# Patient Record
Sex: Female | Born: 1940 | Race: White | Hispanic: No | Smoking: Never smoker
Health system: Southern US, Community
[De-identification: ages and names within clinical notes are randomized; demographics above are authoritative.]

## PROBLEM LIST (undated history)

## (undated) DIAGNOSIS — B0229 Other postherpetic nervous system involvement: Secondary | ICD-10-CM

## (undated) DIAGNOSIS — J45909 Unspecified asthma, uncomplicated: Secondary | ICD-10-CM

## (undated) DIAGNOSIS — M1990 Unspecified osteoarthritis, unspecified site: Secondary | ICD-10-CM

## (undated) DIAGNOSIS — L900 Lichen sclerosus et atrophicus: Secondary | ICD-10-CM

## (undated) DIAGNOSIS — J849 Interstitial pulmonary disease, unspecified: Secondary | ICD-10-CM

## (undated) DIAGNOSIS — E611 Iron deficiency: Secondary | ICD-10-CM

## (undated) DIAGNOSIS — I10 Essential (primary) hypertension: Secondary | ICD-10-CM

## (undated) DIAGNOSIS — J189 Pneumonia, unspecified organism: Secondary | ICD-10-CM

## (undated) DIAGNOSIS — R799 Abnormal finding of blood chemistry, unspecified: Secondary | ICD-10-CM

## (undated) DIAGNOSIS — M069 Rheumatoid arthritis, unspecified: Secondary | ICD-10-CM

## (undated) DIAGNOSIS — I341 Nonrheumatic mitral (valve) prolapse: Secondary | ICD-10-CM

## (undated) DIAGNOSIS — Z8619 Personal history of other infectious and parasitic diseases: Secondary | ICD-10-CM

## (undated) DIAGNOSIS — B379 Candidiasis, unspecified: Secondary | ICD-10-CM

## (undated) DIAGNOSIS — N904 Leukoplakia of vulva: Secondary | ICD-10-CM

## (undated) HISTORY — DX: Rheumatoid arthritis, unspecified: M06.9

## (undated) HISTORY — DX: Candidiasis, unspecified: B37.9

## (undated) HISTORY — DX: Pneumonia, unspecified organism: J18.9

## (undated) HISTORY — DX: Abnormal finding of blood chemistry, unspecified: R79.9

## (undated) HISTORY — DX: Nonrheumatic mitral (valve) prolapse: I34.1

## (undated) HISTORY — DX: Personal history of other infectious and parasitic diseases: Z86.19

## (undated) HISTORY — DX: Leukoplakia of vulva: N90.4

## (undated) HISTORY — DX: Iron deficiency: E61.1

## (undated) HISTORY — DX: Essential (primary) hypertension: I10

## (undated) HISTORY — DX: Interstitial pulmonary disease, unspecified: J84.9

## (undated) HISTORY — PX: WISDOM TOOTH EXTRACTION: SHX21

## (undated) HISTORY — DX: Unspecified asthma, uncomplicated: J45.909

## (undated) HISTORY — DX: Other postherpetic nervous system involvement: B02.29

## (undated) HISTORY — DX: Unspecified osteoarthritis, unspecified site: M19.90

## (undated) HISTORY — DX: Lichen sclerosus et atrophicus: L90.0

---

## 1999-10-23 ENCOUNTER — Inpatient Hospital Stay (HOSPITAL_COMMUNITY): Admission: EM | Admit: 1999-10-23 | Discharge: 1999-10-24 | Payer: Self-pay

## 1999-11-05 ENCOUNTER — Encounter: Payer: Self-pay | Admitting: Critical Care Medicine

## 1999-11-05 ENCOUNTER — Ambulatory Visit: Admission: RE | Admit: 1999-11-05 | Discharge: 1999-11-05 | Payer: Self-pay | Admitting: Critical Care Medicine

## 1999-11-05 ENCOUNTER — Encounter (INDEPENDENT_AMBULATORY_CARE_PROVIDER_SITE_OTHER): Payer: Self-pay | Admitting: Specialist

## 2000-08-03 ENCOUNTER — Other Ambulatory Visit: Admission: RE | Admit: 2000-08-03 | Discharge: 2000-08-03 | Payer: Self-pay | Admitting: Obstetrics and Gynecology

## 2000-10-26 ENCOUNTER — Encounter: Payer: Self-pay | Admitting: Emergency Medicine

## 2000-10-26 ENCOUNTER — Emergency Department (HOSPITAL_COMMUNITY): Admission: EM | Admit: 2000-10-26 | Discharge: 2000-10-26 | Payer: Self-pay | Admitting: Emergency Medicine

## 2000-11-09 ENCOUNTER — Inpatient Hospital Stay (HOSPITAL_COMMUNITY): Admission: EM | Admit: 2000-11-09 | Discharge: 2000-11-12 | Payer: Self-pay | Admitting: Emergency Medicine

## 2000-11-17 ENCOUNTER — Encounter: Admission: RE | Admit: 2000-11-17 | Discharge: 2000-11-17 | Payer: Self-pay | Admitting: Internal Medicine

## 2000-11-17 ENCOUNTER — Encounter: Payer: Self-pay | Admitting: Internal Medicine

## 2000-12-30 ENCOUNTER — Encounter: Admission: RE | Admit: 2000-12-30 | Discharge: 2000-12-30 | Payer: Self-pay | Admitting: Internal Medicine

## 2000-12-30 ENCOUNTER — Encounter: Payer: Self-pay | Admitting: Internal Medicine

## 2002-10-04 ENCOUNTER — Other Ambulatory Visit: Admission: RE | Admit: 2002-10-04 | Discharge: 2002-10-04 | Payer: Self-pay | Admitting: *Deleted

## 2004-01-23 ENCOUNTER — Other Ambulatory Visit: Admission: RE | Admit: 2004-01-23 | Discharge: 2004-01-23 | Payer: Self-pay | Admitting: *Deleted

## 2005-02-11 ENCOUNTER — Ambulatory Visit (HOSPITAL_COMMUNITY): Admission: RE | Admit: 2005-02-11 | Discharge: 2005-02-11 | Payer: Self-pay | Admitting: *Deleted

## 2006-05-12 ENCOUNTER — Other Ambulatory Visit: Admission: RE | Admit: 2006-05-12 | Discharge: 2006-05-12 | Payer: Self-pay | Admitting: Obstetrics and Gynecology

## 2006-05-12 DIAGNOSIS — N904 Leukoplakia of vulva: Secondary | ICD-10-CM

## 2006-05-12 HISTORY — DX: Leukoplakia of vulva: N90.4

## 2006-05-26 DIAGNOSIS — L900 Lichen sclerosus et atrophicus: Secondary | ICD-10-CM

## 2006-05-26 HISTORY — DX: Lichen sclerosus et atrophicus: L90.0

## 2007-01-04 ENCOUNTER — Encounter: Admission: RE | Admit: 2007-01-04 | Discharge: 2007-01-04 | Payer: Self-pay | Admitting: Internal Medicine

## 2007-04-12 ENCOUNTER — Ambulatory Visit (HOSPITAL_COMMUNITY): Admission: RE | Admit: 2007-04-12 | Discharge: 2007-04-12 | Payer: Self-pay | Admitting: *Deleted

## 2007-05-10 ENCOUNTER — Encounter (INDEPENDENT_AMBULATORY_CARE_PROVIDER_SITE_OTHER): Payer: Self-pay | Admitting: Surgery

## 2007-05-11 ENCOUNTER — Inpatient Hospital Stay (HOSPITAL_COMMUNITY): Admission: RE | Admit: 2007-05-11 | Discharge: 2007-05-12 | Payer: Self-pay | Admitting: Surgery

## 2009-06-08 ENCOUNTER — Encounter: Admission: RE | Admit: 2009-06-08 | Discharge: 2009-06-08 | Payer: Self-pay | Admitting: Internal Medicine

## 2009-07-13 HISTORY — PX: CHOLECYSTECTOMY: SHX55

## 2009-12-17 ENCOUNTER — Emergency Department (HOSPITAL_COMMUNITY): Admission: EM | Admit: 2009-12-17 | Discharge: 2009-12-18 | Payer: Self-pay | Admitting: Emergency Medicine

## 2010-08-03 ENCOUNTER — Encounter: Payer: Self-pay | Admitting: Internal Medicine

## 2010-09-29 LAB — POCT I-STAT, CHEM 8
BUN: 9 mg/dL (ref 6–23)
Calcium, Ion: 1.13 mmol/L (ref 1.12–1.32)
Chloride: 103 mEq/L (ref 96–112)
Creatinine, Ser: 0.8 mg/dL (ref 0.4–1.2)
Glucose, Bld: 85 mg/dL (ref 70–99)
HCT: 38 % (ref 36.0–46.0)
Hemoglobin: 12.9 g/dL (ref 12.0–15.0)
Potassium: 4.2 mEq/L (ref 3.5–5.1)
Sodium: 140 mEq/L (ref 135–145)
TCO2: 31 mmol/L (ref 0–100)

## 2010-09-29 LAB — CBC
HCT: 36.6 % (ref 36.0–46.0)
Hemoglobin: 12.3 g/dL (ref 12.0–15.0)
MCHC: 33.5 g/dL (ref 30.0–36.0)
MCV: 92.2 fL (ref 78.0–100.0)
Platelets: 214 10*3/uL (ref 150–400)
RBC: 3.97 MIL/uL (ref 3.87–5.11)
RDW: 14.8 % (ref 11.5–15.5)
WBC: 7.1 10*3/uL (ref 4.0–10.5)

## 2010-09-29 LAB — DIFFERENTIAL
Basophils Absolute: 0.1 10*3/uL (ref 0.0–0.1)
Basophils Relative: 2 % — ABNORMAL HIGH (ref 0–1)
Eosinophils Absolute: 0.3 10*3/uL (ref 0.0–0.7)
Eosinophils Relative: 4 % (ref 0–5)
Lymphocytes Relative: 45 % (ref 12–46)
Lymphs Abs: 3.2 10*3/uL (ref 0.7–4.0)
Monocytes Absolute: 0.8 10*3/uL (ref 0.1–1.0)
Monocytes Relative: 12 % (ref 3–12)
Neutro Abs: 2.6 10*3/uL (ref 1.7–7.7)
Neutrophils Relative %: 37 % — ABNORMAL LOW (ref 43–77)

## 2010-09-29 LAB — URINALYSIS, ROUTINE W REFLEX MICROSCOPIC
Bilirubin Urine: NEGATIVE
Glucose, UA: NEGATIVE mg/dL
Hgb urine dipstick: NEGATIVE
Ketones, ur: NEGATIVE mg/dL
Nitrite: NEGATIVE
Protein, ur: NEGATIVE mg/dL
Specific Gravity, Urine: 1.006 (ref 1.005–1.030)
Urobilinogen, UA: 0.2 mg/dL (ref 0.0–1.0)
pH: 7 (ref 5.0–8.0)

## 2010-09-29 LAB — URINE MICROSCOPIC-ADD ON

## 2010-11-25 NOTE — Op Note (Signed)
Stacey [name], Stacey [name]             ACCOUNT NO.:  [id number]   MEDICAL RECORD NO.:  [id number]          PATIENT TYPE:  INP   LOCATION:  1601                         FACILITY:  [facility]   PHYSICIAN:  Georgiana Spinner, M.D.    DATE OF BIRTH:  01-16-1941   DATE OF PROCEDURE:  05/12/2007  DATE OF DISCHARGE:  05/12/2007                               OPERATIVE REPORT   PROCEDURE:  Endoscopic retrograde cholangiopancreatography.   PROCEDURE INDICATIONS:  Common bile duct stones.   ANESTHESIA:  Fentanyl 50 mcg, Versed 6 mg.   PROCEDURE:  With the patient mildly sedated in the prone position, the  Pentax videoscopic side-viewing duodenoscope was inserted in the mouth,  passed through the esophagus, stomach into the second portion of the  duodenum where the ampulla Vater was seen.  Bile was seen to emanate  from the opening.  After shortening the endoscope into the proper  position, we then used the Tritome catheter and placed it in the os.  A  guidewire was passed and seen to enter into the common bile duct.  I  followed this guidewire with a catheter.  Injected contrast material to  outline the common bile duct.  It was noted that there were four filling  defects consistent with common bile duct stones seen.  Once this was  noted, the catheter was pulled back and the patient was grounded and a  sphincterotomy was performed.  Once I was satisfied with the length of  the sphincterotomy the Tritome catheter was removed and a 12.5 mm  balloon catheter was passed over the guidewire and passed proximally  into the common bile duct where was inflated and pulled through.  After  the first pull through, I then put the balloon catheter back in place  distally at the site of the incision and injected contrast material once  again into the common bile duct.  At that point no further filling  defects were seen and a radiograph was taken.  The balloon was deflated  and passed proximally to the area of the  bifurcation and once again it  was insufflated and pulled through.  With the second pull through the  balloon was again deflated and placed distally at the area of the  sphincterotomy.  Contrast was once again injected into the common bile  duct and no filling defects were once again seen.  The balloon was then  deflated, the endoscope was withdrawn.  The patient's vital signs, pulse  oximeter remained stable.  The patient tolerated procedure well without  apparent complications.   FINDINGS:  Common bile duct stones, sphincterotomy performed and balloon  pull through with no residual stones seen on radiograph.   PLAN:  Have patient follow clinically           ______________________________  Georgiana Spinner, M.D.     GMO/MEDQ  D:  05/12/2007  T:  05/12/2007  Job:  [id number]   cc:   Velora Heckler, MD  [address] [place]  [place] [address]

## 2010-11-25 NOTE — Op Note (Signed)
Stacey [name], Stacey [name]             ACCOUNT NO.:  [id number]   MEDICAL RECORD NO.:  [id number]          PATIENT TYPE:  OIB   LOCATION:  0098                         FACILITY:  [facility]   PHYSICIAN:  Velora Heckler, MD      DATE OF BIRTH:  July 15, 1940   DATE OF PROCEDURE:  05/10/2007  DATE OF DISCHARGE:                               OPERATIVE REPORT   PREOPERATIVE DIAGNOSES:  1. Symptomatic cholelithiasis.  2. Chronic cholecystitis.   POSTOPERATIVE DIAGNOSES:  1. Symptomatic cholelithiasis.  2. Chronic cholecystitis.  3. Choledocholithiasis.   PROCEDURE:  Laparoscopic cholecystectomy with intraoperative  cholangiography.   SURGEON:  Velora Heckler MD, FACS   ANESTHESIA:  General per Dr. Cristela Blue.   ESTIMATED BLOOD LOSS:  Minimal.   PREPARATION:  Betadine.   COMPLICATIONS:  None.   INDICATIONS:  The patient is a 70 year old white female from [place],  [place].  The patient has had intermittent epigastric abdominal  pain for several months.  She was seen and evaluated by Dr. Sabino Gasser  and initially had elevated liver function tests which normalized.  The  patient has noted some intermittent acholic stools.  Ultrasound  demonstrated multiple gallstones.  The patient now comes to surgery for  cholecystectomy and intraoperative cholangiography.   DESCRIPTION OF PROCEDURE:  Procedure was done in OR #1 at [facility].  The patient is brought to the operating room,  placed in supine position on the operating room table.  Following  administration of general anesthesia, the patient is positioned and then  prepped and draped in the usual strict aseptic fashion.  After  ascertaining that an adequate level of anesthesia had been obtained, a  infraumbilical incision is made in the midline with a #15 blade.  Dissection is carried down to the fascia.  There is a small umbilical  hernia.  The hernia defect is opened sharply with the #15 blade.  It  contains preperitoneal fat which is reduced back within the peritoneal  cavity.  The hernia defect is enlarged slightly by incising the fascia.  Peritoneal cavity is then entered cautiously.  A #1 Ethibond pursestring  suture is placed around the hernia defect.  A port is introduced and  secured with the pursestring suture.  Abdomen is insufflated with carbon  dioxide.  Laparoscope is introduced and the abdomen explored.  Operative  ports are placed along the right costal margin in the midline,  midclavicular line and anterior axillary line.  Fundus of the  gallbladder is grasped and retracted cephalad.  There are numerous  omental adhesions to the undersurface of the gallbladder which are taken  down with gentle blunt dissection and hemostasis obtained with the  electrocautery.  Dissection is carried down to the neck of the  gallbladder.  Gallbladder wall appears moderately thickened.  Peritoneum  is incised at the neck of the gallbladder and the cystic duct is  dissected out along its length.  Cystic artery is also dissected out,  doubly clipped, and divided.  The triangle of Calot is widely opened.  Clip is placed at the neck of the  gallbladder.  Cystic duct is incised.  Clear yellow bile emanates from the cystic duct.  A Cook cholangiography  catheter is introduced through a stab wound in the right upper quadrant.  It is advanced and inserted into the cystic duct and secured with a  Ligaclip.  Using C-arm fluoroscopy, real time cholangiography is  performed.  There is rapid filling of a normal-appearing cystic duct.  There is free flow into the common bile duct.  There is reflux of  contrast into the right and left hepatic ductal systems.  There is flow  distally into the distal common bile duct at which point there are  multiple filling defects representing at least four choleliths.  These  are partially obstructing although some contrast does reach the  duodenum.  Clip is withdrawn  and Cook catheter is removed from the  peritoneal cavity.  Cystic duct is triply clipped and divided.  Gallbladder is then excised from the gallbladder bed using the hook  electrocautery for hemostasis.  Gallbladder is placed into an EndoCatch  bag and withdrawn through the umbilical port.  It contains multiple  gallstones.  A #1 at the bond pursestring suture is then tied securely.  Right upper quadrant is irrigated with warm saline which is evacuated.  Ports are removed under direct vision and good hemostasis is noted at  all port sites.  Pneumoperitoneum is released.  All ports are removed.  Port sites are anesthetized with local anesthetic.  Wounds are closed  with interrupted 4-0 Vicryl subcuticular sutures.  Wounds are washed and  dried.  Benzoin and Steri-Strips are applied.  Sterile dressings are  applied.  The patient is awakened from anesthesia and brought to the  recovery room in stable condition.  The patient tolerated the procedure  well.      Velora Heckler, MD  Electronically Signed     TMG/MEDQ  D:  05/10/2007  T:  05/10/2007  Job:  [id number]   cc:   Georgiana Spinner, M.D.  Fax: [phone]   Soyla Murphy. Renne Crigler, M.D.  Fax: [phone]

## 2010-11-28 NOTE — Discharge Summary (Signed)
[place]. [facility]  Patient:    Stacey [name], Stacey [name]                    MRN: [id number] Adm. Date:  [id number] Disc. Date: [id number] Attending:  Londell Moh CC:         Jaclyn Prime. Lucas Mallow, M.D.   Discharge Summary  LABORATORIES:  Viral titers - pending.  Blood cultures x 4, negative.  CPKs normal.  IgG slightly low, 459 - total, IgM total within normal limits 3. Hepatic panel was normal except for low albumin 2.1, low total protein 5.1. Glucose levels range from 124 to 130, initial setting was 133, by the second it was 137.  INR and PTT were normal.  SED rate was 130, white count 14.4, hemoglobin 9.2 to 10.0.  Platelet count 513,000.  LABORATORY REPORTS:  Chest x-ray, no active disease.  EKG, normal sinus rhythm, occasional premature, supraventricular complexes, otherwise normal. SGT abnormality consistent with early repolarization.  Echocardiogram, trivial aortic valvular regurgitation, otherwise normal with no pericardial effusion.  HOSPITAL COURSE:  Please see admission history and physical for details. Briefly, she was admitted with sharp left anterior chest pain and EKG consistent with acute pericarditis.  Echocardiogram did not show any significant pericarditis, however.  She was treated with IV Solu-Medrol according to directions of Dr. Jimmy Footman and was seen by Dr. Lucas Mallow in consultation as well.  Cliffton Asters of infectious disease also helped with her care.  Suggest we observe her off antibiotics.  By May 2, she was switched to p.o. Medrol and her symptoms gradually improved.  By May 3, her pain was gone, she was discharged.  DISCHARGE MEDICATIONS:  1. Ambien 10 mg 1/2 at bedtime if needed.  2. Medrol 16 mg 2 in the morning and 1 in the evening.  3. Neurontin 100 mg 3 times a day.  4. Effexor XR 75 mg daily.  5. Ultracet 1 p.o. 4 times a day as needed for pain.  6. B-complex daily.  7. Prilosec 20 mg daily.  8. Dyazide as needed for  edema ______ .  9. Three Fibercon tablets nightly. 10. Citracal twice a day. 11. Multivitamin daily.  FOLLOWUP:  She is to follow up with Dr. Jimmy Footman the following Wednesday. She will not take Kinered or Arava until she sees Dr. Jimmy Footman. DD:  11/19/00 TD:  11/22/00 Job: [id number]

## 2010-11-28 NOTE — H&P (Signed)
[place]. [facility]  Patient:    Stacey [name], Stacey [name]                      MRN: [id number] Adm. Date:  11/09/00 Attending:  Lemmie Evens, M.D.                         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  In the early evening, last night, the patient developed severe retrosternal chest pain. The pain did seem to be affected by deep breathing at times. She describes the pain as a fullness in the center of the chest. It is non migrating and is not associated with cough. She has had mild regurgitation and did take an antacid without significant relief of the symptoms. She did come to the office today because of the continued heaviness in the chest. The patient has not had a recent history of fevers, sweats, chills, a productive cough, abdominal discomfort, weight loss.  In the office, it is was noted the patient did have an abnormal EKG with ST segment elevations in most of the leads. This could be consistent with either ischemic heart disease or pericarditis.  The patient does have a history of rheumatoid arthritis for the past 2 to 3 years. 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 Delford Field, M.D. The patient did recently discontinue the antibiotics for the M. avium which included Myambutol and Biaxin.  The patient also has developed herpes zoster infection involving the left paralumbar area. This did extend down the left leg. She has been followed in the pain clinic at [facility] for treatment of post herpetic neuralgia. She has been taking Neurontin 300 mg 3 to 4 times a day for treatment of  that.  The patient has been on various disease-modifying agents. The patient did take Remicade and methotrexate, as well as Medrol at the time when she developed M. avium. The patient has not taken methotrexate or Remicade since that time. She was medicated with Plaquenil which seemed to cause a skin rash. She did develop signs of herpes zoster while taking Enbrel injections.  The patient did receive her first Kineret injection subcutaneous yesterday. Apparently, she had no immediate side effects from that medication after the injection.  CURRENT MEDICATIONS: 1. The patient does take Arava 20 mg daily. 2. Medrol 8 mg daily. 3. Dyazide one tablet every other day for control of peripheral edema. 4. MiraLax as needed. 5. Neurontin 300 mg 3 times a day. 6. Effexor 37.5 mg daily. 7. Also, she will use Ativan at night and Nexium during the day.  ALLERGIES:  The patient does give an allergic history to both SULFA and PENICILLIN medications.  SOCIAL HISTORY:  The patient does not smoke cigarettes or drink alcohol.  PHYSICAL EXAMINATION:  GENERAL:  The patient appears to be acutely ill complaining of retrosternal chest pain.  EXTREMITIES:  Examination of the extremities revealed decreased hand grip bilaterally. There is some swelling of the second to the fourth MCP and PIP joints. Wrist, elbow, and shoulder exam revealed good range of motion.  The knee exam revealed good flexion, extension of the knee. Ankle and foot exam revealed some periarticular swelling of both ankle joints with pain on motion of the ankle joint. There is 2 to 3+ pitting edema pretibially. Hip exam revealed good hip range of motion.  LUNGS:  Clear breath sounds bilaterally without rales.  HEART:  Regular sinus rhythm. Heart rate of 100. No definite murmur, gallop, or rub was appreciated.  ABDOMEN:  Soft abdomen without hepatosplenomegaly.  NECK:  No definite thyromegaly or adenopathy.  ASSESSMENT:  The  patient is being admitted for evaluation of persistent chest pain over the last 12 hour. EKG does show ST elevation consistent with either an acute myocardial event or pericarditis. She is to be admitted to a monitor unit and will be seen by a cardiologist. I do plan to maintain the steroid medication and even give her parenteral steroids for treatment of possible pericarditis. The patient will be considered for continued use of the Kineret and Arava after her discharge.DD:  11/09/00 TD:  11/09/00 Job: [id number]

## 2010-11-28 NOTE — Op Note (Signed)
Stacey [name], Stacey [name]             ACCOUNT NO.:  [id number]   MEDICAL RECORD NO.:  [id number]          PATIENT TYPE:  AMB   LOCATION:  ENDO                         FACILITY:  [facility]   PHYSICIAN:  Georgiana Spinner, M.D.    DATE OF BIRTH:  [birth year]   DATE OF PROCEDURE:  02/11/2005  DATE OF DISCHARGE:                                 OPERATIVE REPORT   PROCEDURE:  Upper endoscopy.   INDICATIONS:  Gastroesophageal reflux disease.   ANESTHESIA:  Demerol 20, Versed 2 mg.   DESCRIPTION OF PROCEDURE:  With the patient mildly sedated in the left  lateral decubitus position, the Olympus videoscopic endoscope was inserted  in the mouth, passed under direct vision through the esophagus which  appeared normal into the stomach. The fundus, body, antrum, duodenal bulb,  and second portion of duodenum were visualized. From this point, the  endoscope was slowly withdrawn taking circumferential views of the duodenal  mucosa until the endoscope had been pulled back into the stomach, placed in  retroflexion to view the stomach from below. The endoscope was straightened  and  withdrawn taking circumferential views of the remaining gastric and  esophageal mucosa. The patient's vital signs and pulse oximeter remained  stable. The patient tolerated the procedure well without apparent  complications.   FINDINGS:  Rather unremarkable endoscopic examination.   PLAN:  Proceed to colonoscopy.       GMO/MEDQ  D:  02/11/2005  T:  02/11/2005  Job:  [id number]

## 2010-11-28 NOTE — Consult Note (Signed)
[place]. [facility]  Patient:    KRISTALYN, BERGSTRESSER                    MRN: [id number] Proc. Date: 11/10/00 Adm. Date:  [id number] Attending:  Londell Moh CC:         Soyla Murphy. Renne Crigler, M.D.  Lemmie Evens, M.D.   Consultation Report  REASON FOR CONSULTATION: It is a privilege and pleasant to participate in the care of Ms. Lalania Haseman, a 70 year old patient of Dr. Syliva Overman and Dr. Merri Brunette, at Dr. Shellia Cleverly request, by providing consultative services in regard to Ms. Pierces chest pain.  HISTORY OF PRESENT ILLNESS: She reports that about 3 a.m. on arising and going back to bed she developed severe retrosternal chest pain, which did seem to be effected by breathing to some degree.  There was no associated shortness of breath.  She took an antacid and has no significant relief from the symptoms. On awakening in the morning she continued to have chest pain and came to the office to be evaluated.  An EKG was taken which showed ST elevations in multiple leads, and she was admitted because of the concern that the chest pain could represent acute myocardial ischemia.  PAST MEDICAL HISTORY: Her past medical history has been quite difficult.  She was in very good health until about three years ago when she developed very aggressive rheumatoid arthritis, and has had multiple complications from almost every medication that has been used.  This included M. avium pneumonitis, which was treated also in conjunction with Dr. Shan Levans. She developed herpes zoster relatively recently.  This has effected her left leg, and she has been followed in the pain clinic at [facility] for treatment of postherpetic neuralgia.  She has been taking Neurontin 300 mg three or four times daily.  She has had no other major medical illness, except as associated with rheumatoid arthritis and numerous problems with medications, which are also detailed in  Dr. Landry Dyke History and Physical.  She has been on numerous disease-modifying agents and, unfortunately, has had adverse reactions to most of them.  PAST SURGICAL HISTORY: She has never had a major surgical operation.  SOCIAL HISTORY: She lives at home with her husband of many years.  She has never smoked and uses no alcohol.  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  Her mother, who is 4, lives in town and has been very helpful during times of the patients worse immobility.  She has a daughter who is grown up and married. There is also a son who is grown up and married.  FAMILY HISTORY: Her father died in his mid 33s of smoking-related lung disease.  Her mother, now 4, has had mild hypertension, and has been treated for several years for polymyalgia rheumatica.  REVIEW OF SYSTEMS: CONSTITUTIONAL: She has had no fever or chill.  She has no claudication.  Her sleep is often disturbed by pain.  EYES: No diplopia or blurring.  She does wear corrective lenses. ENT: No deafness of dizziness. She has her own teeth.  CARDIOVASCULAR: See History of Present Illness.  There is no prior history of any cardiac problem, including palpitations or dyspnea. RESPIRATORY: No cough or wheezing.  Mycobacterium avium infection as noted above.  This apparently has now been  cured following a long course of antibiotics.  GI: Occasional heartburn and occasional constipation.  GU: No dysuria or pyuria.  MUSCULOSKELETAL: Severe diffuse rheumatoid arthritis as noted above.  SKIN/BREAST: No rash or nodule except the zoster rash. NEUROLOGIC: She did have an episode of apparent fainting about two weeks ago. She has had a Holter monitor since.  She has had one or two episodes of possible postural hypotension.  These will require further evaluation at some point.   PSYCHIATRIC: No depression or hallucinations.  ENDOCRINE: No known diabetes or thyroid disease.  HEMATOLOGIC/LYMPH: No swelling in neck, axillae or groin.  All the remaining systems in the comprehensive 14 system review are negative.  ALLERGIES: She has specific allergies to:  1. PENICILLIN.  2. SULFA.  3. Multiple adverse drug reactions, for which reference is made to Dr.     Landry Dyke History and Physical.  CURRENT MEDICATIONS:  1. Arava 20 mg q.d.  2. Medrol 8 mg q.d.  3. Dyazide one tablet every other day for control of intermittent edema.  4. MiraLax as-needed.  5. Neurontin 300 mg t.i.d.  6. Effexor 37.5 mg.  7. Ativan at night.  8. Nexium during the day.  9. On the day prior to this admission she was started on a new medication,     Kineret, an interleukin-1 inhibitor.  PHYSICAL EXAMINATION:  VITAL SIGNS: Blood pressure 130/80, pulse 90 and regular, respirations 22 and unlabored.  GENERAL: She is a well-developed, well-nourished, but now somewhat cushingoid appearing woman, who looks approximately her stated age of 3.  She is oriented to person, place, and time.  Mood and affect are appropriate.  HEENT: Conjunctivae and lids reveal no xanthelasma, icterus, or arcus senilis. Her teeth are her own and in good repair.  Oral mucosa reveals no pallor or cyanosis.  NECK: Supple and symmetric.  Trachea midline and mobile.  No palpable thyromegaly or cervical node, no carotid bruits or JVD.  CHEST: Her respiratory effort is normal.  Her lungs are clear to auscultation and percussion.  BACK: Straight without kyphosis, scoliosis, or punch tenderness.  Her gait is slow and stiff.  She could not undergo a stress test.  CARDIOVASCULAR: The cardiac apical impulse is normal in position, size, and duration. Heart rhythm is regular and rate is normal.  There is no gallop or click.  There may be a very soft rub at the left left sternal border.  If so,  it is grade 1/6 at most.   There is no diastolic rub.  There is possibly a scratch around the second heart sound at the left upper sternal border.  The digits and nail reveal no clubbing or cyanosis.  Skin and subcutaneous tissue reveal no stasis dermatitis or ulcer.  ABDOMEN: Flat, nontender, without hepatosplenomegaly.  The abdominal aorta is not palpable and there is no bruit.  EXTREMITIES: The femoral arteries are not palpable and there is no bruit.  The pedal pulses are faintly palpable.  There is 1+ edema of the right ankle. There are only a few remaining zoster lesions.  LABORATORY DATA: EKG shows sinus rhythm with ST segment elevations in many leads, also PR depression.  IMPRESSION/RECOMMENDATIONS: Given the chest pain, the absence of characteristic findings of pericarditis, and the abnormal electrocardiogram, the first issue here is to rule out an unstable coronary syndrome.  She was placed on a nitroglycerin drip and enzymes will be obtained.  If they are negative then she can be treated for pericarditis alone.  If her  enzymes are positive she will need early cardiac catheterization.  Again, I appreciate the opportunity to participate in her care.  I will plan early follow-up to catch any abnormality of enzymes which may be forthcoming. DD:  11/10/00 TD:  11/10/00 Job: [id number]

## 2010-11-28 NOTE — Op Note (Signed)
Stacey [name], Stacey [name]             ACCOUNT NO.:  [id number]   MEDICAL RECORD NO.:  [id number]          PATIENT TYPE:  AMB   LOCATION:  ENDO                         FACILITY:  [facility]   PHYSICIAN:  Georgiana Spinner, M.D.    DATE OF BIRTH:  26-Sep-1940   DATE OF PROCEDURE:  02/11/2005  DATE OF DISCHARGE:                                 OPERATIVE REPORT   PROCEDURE:  Colonoscopy.   INDICATIONS:  Colon cancer screening.   ANESTHESIA:  Demerol 50, Versed 4 mg.   DESCRIPTION OF PROCEDURE:  With the patient mildly sedated in the left  lateral decubitus position, the Olympus videoscopic colonoscope was inserted  into the rectum, passed through a very tight sigmoid colon area that  appeared to be somewhat strictured, probably due to adhesions extrinsically.  We were able though subsequently to pass the colonoscope to the cecum  identified by the ileocecal valve and the base of the cecum. Of note, the  prep was suboptimal and from this point colonoscope was slowly withdrawn  taking circumferential views of the colonic mucosa stopping to suction  copious amounts of dark brown liquid material from the colon as best we  could. When we finished, there was a complete suction container full of dark  brown liquid but no gross lesions were seen as we withdrew all the way to  the rectum, taking circumferential views of the colonic mucosa stopping then  only in the rectum other than when we stopped to suction and clean fecal  debris. The rectum appeared normal on direct and retroflexed view and showed  small hemorrhoids. The endoscope was straightened and withdrawn. The  patient's vital signs and pulse oximeter remained stable. The patient  tolerated the procedure well without apparent complications.   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   PLAN:  Will have the  patient follow-up with me as needed.       GMO/MEDQ  D:  02/11/2005  T:  02/11/2005  Job:  [id number]

## 2011-01-19 ENCOUNTER — Encounter (HOSPITAL_BASED_OUTPATIENT_CLINIC_OR_DEPARTMENT_OTHER): Payer: Medicare Other | Attending: Internal Medicine

## 2011-01-19 DIAGNOSIS — S81009A Unspecified open wound, unspecified knee, initial encounter: Secondary | ICD-10-CM | POA: Insufficient documentation

## 2011-01-19 DIAGNOSIS — Z79899 Other long term (current) drug therapy: Secondary | ICD-10-CM | POA: Insufficient documentation

## 2011-01-19 DIAGNOSIS — M069 Rheumatoid arthritis, unspecified: Secondary | ICD-10-CM | POA: Insufficient documentation

## 2011-01-19 DIAGNOSIS — W108XXA Fall (on) (from) other stairs and steps, initial encounter: Secondary | ICD-10-CM | POA: Insufficient documentation

## 2011-01-20 NOTE — Assessment & Plan Note (Unsigned)
[facility]  NAME:  Stacey [name], Stacey [name]             ACCOUNT NO.:  [id number]  MEDICAL RECORD NO.:  [id number]      DATE OF BIRTH:  1941-04-11  PHYSICIAN:  Jonelle Sports. Sevier, M.D.  VISIT DATE:  01/19/2011                                  OFFICE VISIT   HISTORY:  This 70 year old white female is seen for advice regarding slowly healing traumatic wound of the left lower extremity.  The patient was generally well and in her usual state of health and traveling in [place] some 2 months ago.  At that time, she had a fall marbles staircase and avulsed a flap of skin from the left mid pretibial area.  She did not have anything done about this until the following day when she went to the hospital on the various [place] in [place] and the avulsed flap was flattened back out and sutured in place.  She was begun on an antibiotic.  Initially, things seem to do well, but following removal of her stitches some 10-12 days later [place], the left hand wing of the flap began to dehisce.  There have been some drainage, but not excessive and nothing malodorous and no significant pain in the area.  She simply concerned that the wound has not gone ahead and healed, and comes today for our evaluation and advice.  PAST MEDICAL HISTORY:  Operations include cholecystectomy in 2009, bunion and hammertoe repair on the right foot in February 2012, bilateral cataract surgeries.  Other hospitalizations were for Staph infection of the right hand in 2007 and a pericarditis in 1998.  She is allergic to PENICILLIN and SULFA.  Her regular medications include: 1. Ocuvite one daily. 2. Remicade injection every 4 weeks. 3. Nexium 40 mg daily. 4. Amitriptyline 25 mg at bedtime. 5. Ultram 50 mg one at bedtime, otherwise p.r.n. 6. Methylprednisolone 4 mg and 6 mg alternating on daily basis. 7. Effexor 75 mg one at bedtime. 8. Neurontin 300 mg two daily. 9. Vitamin D 1000 mg  daily. 10.Calcium and magnesium 500 mg daily. 11.Baby aspirin one daily.  FAMILY HISTORY:  Not reviewed in detail.  Apparently is negative for rheumatoid arthritis and diabetes, and also heart disease.  PERSONAL HISTORY:  The patient is married, lives with husband, is totally independent in terms of homemaker responsibilities and personal self-care.  She does not smoke, use alcohol, or drink recreational drugs.  REVIEW OF SYSTEMS:  Really, her present symptomatology relates primarily to her rheumatoid arthritis, which has been beautifully controlled on her Remicade.  She did one time in the past take steroids for the rheumatoid and attributes her cataracts to that.  She remains at present on the methylprednisolone as was mentioned above.  She has no history of diabetes, no known renal disease, no hypertension, no gastrointestinal symptomatology or blood loss, no hepatobiliary problems since her cholecystectomy.  PHYSICAL EXAMINATION:  VITAL SIGNS:  Blood pressure is 130/76, pulse 75 and regular, respirations 14 unlabored, temperature 98.9. GENERAL:  This is a well-developed, well-nourished, white female who appears perhaps somewhat younger than her stated age of 1. SKIN:  Warm and dry.  There is no palate.  Mucous membranes are moist and pink.  Despite her rheumatoid, she has no gross joint deformities that I  can detect. LUNGS:  She has clear lungs. CARDIAC:  Regular heart rhythm. ABDOMEN:  Not examined as the patient is sitting up at the moment. EXTREMITIES:  Show trace of edema on the left distal to her wound. Otherwise, no edema.  All pulses are palpable and AB indices are approximately 0.8 bilaterally.  On the right anterior pretibial area, there is a triangular-shaped flap with its apex pointing distalward, which has been resutured into place with the medial limb of this suture line still holding with the lateral line dehisced to a matter of about 0.5 cm along its length.   There is some grossly exudate in its base.  IMPRESSION:  Traumatic wound with compromised healing probably in part due to the delay in initial closure to her rheumatoid arthritis and to her Remicade and steroid therapy.  DISPOSITION:  The wound is debrided of some soft slough as well as the more fibrous tissue.  This is well tolerated, it bleeds freely, but ceases to bleed very quickly on small amount of pressure.  It is treated with an application of Silver collagen along its open areas and this is covered with a TL dressing.  The patient is given materials and advised to change the dressing once in 3 days and to see me again in 7 days.  Incidentally, the wound is cultured after debridement.          ______________________________ Jonelle Sports. Cheryll Cockayne, M.D.     RES/MEDQ  D:  01/19/2011  T:  01/20/2011  Job:  [id number]

## 2011-02-23 ENCOUNTER — Encounter (HOSPITAL_BASED_OUTPATIENT_CLINIC_OR_DEPARTMENT_OTHER): Payer: Medicare Other

## 2011-04-22 LAB — URINALYSIS, ROUTINE W REFLEX MICROSCOPIC
Bilirubin Urine: NEGATIVE
Glucose, UA: NEGATIVE
Hgb urine dipstick: NEGATIVE
Ketones, ur: NEGATIVE
Nitrite: NEGATIVE
Protein, ur: NEGATIVE
Specific Gravity, Urine: 1.005
Urobilinogen, UA: 0.2
pH: 6

## 2011-04-22 LAB — CBC
HCT: 36.9
Hemoglobin: 12.4
MCHC: 33.5
MCV: 89.9
Platelets: 286
RBC: 4.11
RDW: 14.2 — ABNORMAL HIGH
WBC: 8

## 2011-04-22 LAB — COMPREHENSIVE METABOLIC PANEL
ALT: 62 — ABNORMAL HIGH
AST: 34
Albumin: 3.6
Alkaline Phosphatase: 135 — ABNORMAL HIGH
BUN: 10
CO2: 33 — ABNORMAL HIGH
Calcium: 9.7
Chloride: 102
Creatinine, Ser: 0.87
GFR calc Af Amer: >60
GFR calc non Af Amer: >60
Glucose, Bld: 100 — ABNORMAL HIGH
Potassium: 5.1
Sodium: 142
Total Bilirubin: 0.7
Total Protein: 7.1

## 2011-04-22 LAB — DIFFERENTIAL
Basophils Absolute: 0
Basophils Relative: 1
Eosinophils Absolute: 0.3
Eosinophils Relative: 3
Lymphocytes Relative: 23
Lymphs Abs: 1.8
Monocytes Absolute: 0.3
Monocytes Relative: 4
Neutro Abs: 5.5
Neutrophils Relative %: 69

## 2011-04-22 LAB — PROTIME-INR
INR: 1
Prothrombin Time: 13.3

## 2011-04-22 LAB — AMYLASE: Amylase: 78

## 2011-04-22 LAB — LIPASE, BLOOD: Lipase: 27

## 2011-07-21 DIAGNOSIS — Z79899 Other long term (current) drug therapy: Secondary | ICD-10-CM | POA: Diagnosis not present

## 2011-07-21 DIAGNOSIS — M069 Rheumatoid arthritis, unspecified: Secondary | ICD-10-CM | POA: Diagnosis not present

## 2011-07-23 DIAGNOSIS — M25569 Pain in unspecified knee: Secondary | ICD-10-CM | POA: Diagnosis not present

## 2011-07-23 DIAGNOSIS — M069 Rheumatoid arthritis, unspecified: Secondary | ICD-10-CM | POA: Diagnosis not present

## 2011-07-23 DIAGNOSIS — M25579 Pain in unspecified ankle and joints of unspecified foot: Secondary | ICD-10-CM | POA: Diagnosis not present

## 2011-07-27 ENCOUNTER — Encounter (HOSPITAL_BASED_OUTPATIENT_CLINIC_OR_DEPARTMENT_OTHER): Payer: Medicare Other | Attending: Internal Medicine

## 2011-07-27 DIAGNOSIS — L97809 Non-pressure chronic ulcer of other part of unspecified lower leg with unspecified severity: Secondary | ICD-10-CM | POA: Diagnosis not present

## 2011-07-27 DIAGNOSIS — T8189XA Other complications of procedures, not elsewhere classified, initial encounter: Secondary | ICD-10-CM | POA: Diagnosis not present

## 2011-07-30 DIAGNOSIS — B0229 Other postherpetic nervous system involvement: Secondary | ICD-10-CM | POA: Diagnosis not present

## 2011-07-30 DIAGNOSIS — Z79899 Other long term (current) drug therapy: Secondary | ICD-10-CM | POA: Diagnosis not present

## 2011-07-30 DIAGNOSIS — M069 Rheumatoid arthritis, unspecified: Secondary | ICD-10-CM | POA: Diagnosis not present

## 2011-07-30 DIAGNOSIS — M810 Age-related osteoporosis without current pathological fracture: Secondary | ICD-10-CM | POA: Diagnosis not present

## 2011-07-30 DIAGNOSIS — J309 Allergic rhinitis, unspecified: Secondary | ICD-10-CM | POA: Diagnosis not present

## 2011-08-10 DIAGNOSIS — L97809 Non-pressure chronic ulcer of other part of unspecified lower leg with unspecified severity: Secondary | ICD-10-CM | POA: Diagnosis not present

## 2011-08-18 DIAGNOSIS — Z79899 Other long term (current) drug therapy: Secondary | ICD-10-CM | POA: Diagnosis not present

## 2011-08-18 DIAGNOSIS — M069 Rheumatoid arthritis, unspecified: Secondary | ICD-10-CM | POA: Diagnosis not present

## 2011-08-18 DIAGNOSIS — M79609 Pain in unspecified limb: Secondary | ICD-10-CM | POA: Diagnosis not present

## 2011-08-18 DIAGNOSIS — M899 Disorder of bone, unspecified: Secondary | ICD-10-CM | POA: Diagnosis not present

## 2011-08-18 DIAGNOSIS — M25579 Pain in unspecified ankle and joints of unspecified foot: Secondary | ICD-10-CM | POA: Diagnosis not present

## 2011-08-18 DIAGNOSIS — Z981 Arthrodesis status: Secondary | ICD-10-CM | POA: Diagnosis not present

## 2011-08-24 ENCOUNTER — Encounter (HOSPITAL_BASED_OUTPATIENT_CLINIC_OR_DEPARTMENT_OTHER): Payer: Medicare Other | Attending: Internal Medicine

## 2011-08-24 DIAGNOSIS — L97809 Non-pressure chronic ulcer of other part of unspecified lower leg with unspecified severity: Secondary | ICD-10-CM | POA: Insufficient documentation

## 2011-09-15 DIAGNOSIS — Z79899 Other long term (current) drug therapy: Secondary | ICD-10-CM | POA: Diagnosis not present

## 2011-09-15 DIAGNOSIS — M069 Rheumatoid arthritis, unspecified: Secondary | ICD-10-CM | POA: Diagnosis not present

## 2011-09-30 DIAGNOSIS — Z0189 Encounter for other specified special examinations: Secondary | ICD-10-CM | POA: Diagnosis not present

## 2011-09-30 DIAGNOSIS — Z09 Encounter for follow-up examination after completed treatment for conditions other than malignant neoplasm: Secondary | ICD-10-CM | POA: Diagnosis not present

## 2011-09-30 DIAGNOSIS — N6489 Other specified disorders of breast: Secondary | ICD-10-CM | POA: Diagnosis not present

## 2011-10-13 DIAGNOSIS — Z79899 Other long term (current) drug therapy: Secondary | ICD-10-CM | POA: Diagnosis not present

## 2011-10-13 DIAGNOSIS — M069 Rheumatoid arthritis, unspecified: Secondary | ICD-10-CM | POA: Diagnosis not present

## 2011-10-21 DIAGNOSIS — M069 Rheumatoid arthritis, unspecified: Secondary | ICD-10-CM | POA: Diagnosis not present

## 2011-10-21 DIAGNOSIS — M159 Polyosteoarthritis, unspecified: Secondary | ICD-10-CM | POA: Diagnosis not present

## 2011-11-17 DIAGNOSIS — Z79899 Other long term (current) drug therapy: Secondary | ICD-10-CM | POA: Diagnosis not present

## 2011-11-17 DIAGNOSIS — M069 Rheumatoid arthritis, unspecified: Secondary | ICD-10-CM | POA: Diagnosis not present

## 2011-11-26 DIAGNOSIS — Z79899 Other long term (current) drug therapy: Secondary | ICD-10-CM | POA: Diagnosis not present

## 2011-11-26 DIAGNOSIS — M069 Rheumatoid arthritis, unspecified: Secondary | ICD-10-CM | POA: Diagnosis not present

## 2011-12-15 DIAGNOSIS — M069 Rheumatoid arthritis, unspecified: Secondary | ICD-10-CM | POA: Diagnosis not present

## 2011-12-15 DIAGNOSIS — Z79899 Other long term (current) drug therapy: Secondary | ICD-10-CM | POA: Diagnosis not present

## 2012-01-12 DIAGNOSIS — M069 Rheumatoid arthritis, unspecified: Secondary | ICD-10-CM | POA: Diagnosis not present

## 2012-01-13 ENCOUNTER — Ambulatory Visit (INDEPENDENT_AMBULATORY_CARE_PROVIDER_SITE_OTHER): Payer: Medicare Other | Admitting: Obstetrics and Gynecology

## 2012-01-13 ENCOUNTER — Encounter: Payer: Self-pay | Admitting: Obstetrics and Gynecology

## 2012-01-13 ENCOUNTER — Other Ambulatory Visit: Payer: Self-pay | Admitting: Obstetrics and Gynecology

## 2012-01-13 VITALS — BP 118/72 | Resp 14 | Ht 66.5 in | Wt 145.0 lb

## 2012-01-13 DIAGNOSIS — Z01419 Encounter for gynecological examination (general) (routine) without abnormal findings: Secondary | ICD-10-CM | POA: Diagnosis not present

## 2012-01-13 DIAGNOSIS — L940 Localized scleroderma [morphea]: Secondary | ICD-10-CM | POA: Diagnosis not present

## 2012-01-13 DIAGNOSIS — M069 Rheumatoid arthritis, unspecified: Secondary | ICD-10-CM | POA: Insufficient documentation

## 2012-01-13 DIAGNOSIS — Z124 Encounter for screening for malignant neoplasm of cervix: Secondary | ICD-10-CM

## 2012-01-13 DIAGNOSIS — M899 Disorder of bone, unspecified: Secondary | ICD-10-CM

## 2012-01-13 DIAGNOSIS — K5900 Constipation, unspecified: Secondary | ICD-10-CM | POA: Diagnosis not present

## 2012-01-13 DIAGNOSIS — J45909 Unspecified asthma, uncomplicated: Secondary | ICD-10-CM | POA: Insufficient documentation

## 2012-01-13 DIAGNOSIS — N904 Leukoplakia of vulva: Secondary | ICD-10-CM | POA: Insufficient documentation

## 2012-01-13 DIAGNOSIS — M8580 Other specified disorders of bone density and structure, unspecified site: Secondary | ICD-10-CM

## 2012-01-13 DIAGNOSIS — M949 Disorder of cartilage, unspecified: Secondary | ICD-10-CM | POA: Diagnosis not present

## 2012-01-13 MED ORDER — TRIAMCINOLONE 0.1 % CREAM:EUCERIN CREAM 1:1: 1.0000 "application " | TOPICAL_CREAM | Freq: Three times a day (TID) | CUTANEOUS | Status: DC | PRN

## 2012-01-13 NOTE — Progress Notes (Signed)
The patient is not taking hormone replacement therapy The patient  Takes vitamin d. Post-menopausal bleeding:no  Last Pap: was normal June  2012 Last mammogram: was abnormal:  U/S was done in 09/2011 follow-up 09/30/11 was normal Last DEXA scan : T= -1.0 12/2010 Osteopenia neck and hip Last colonoscopy:normal 2008  Urinary symptoms: none Normal bowel movements: No:  Saw Dr. Renne Crigler recently regarding this Reports abuse at home: No  Subjective:    Stacey [name] is a 71 y.o. female G2P2002 who presents for annual exam. Known osteopenia, RA on steroids, Lichen Sclerosis of vulva The patient has no complaints today.   The following portions of the patient's history were reviewed and updated as appropriate: allergies, current medications, past family history, past medical history, past social history, past surgical history and problem list.  Review of Systems Pertinent items are noted in HPI. Gastrointestinal:No change in bowel habits, no abdominal pain, no rectal bleeding. Irregular BM followed by Dr Renne Crigler Genitourinary:negative for dysuria, frequency, hematuria, nocturia and urinary incontinence    Objective:     BP 118/72  Resp 14  Ht 5' 6.5" (1.689 m)  Wt 145 lb (65.772 kg)  BMI 23.05 kg/m2  Weight:  Wt Readings from Last 1 Encounters:  01/13/12 145 lb (65.772 kg)     BMI: Body mass index is 23.05 kg/(m^2). General Appearance: Alert, appropriate appearance for age. No acute distress HEENT: Grossly normal Neck / Thyroid: Supple, no masses, nodes or enlargement Lungs: clear to auscultation bilaterally Back: No CVA tenderness Breast Exam: No masses or nodes.No dimpling, nipple retraction or discharge. Cardiovascular: Regular rate and rhythm. 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 Pt denies symptoms. Vagina appear normal. Bimanual exam reveals normal uterus and  adnexa. Rectovaginal: normal rectal, no masses Lymphatic Exam: Non-palpable nodes in neck, clavicular, axillary, or inguinal regions Skin: no rash or abnormalities Neurologic: Normal gait and speech, no tremor  Psychiatric: Alert and oriented, appropriate affect.       Assessment:    New ulcerations on Lichen Sclerosis    Plan:    Pap done, Schedule vulvar biopsy, Triamcinolone   Follow-up:  Vulvar biopsy  .

## 2012-01-15 LAB — PAP IG W/ RFLX HPV ASCU

## 2012-01-15 NOTE — Telephone Encounter (Signed)
Triamcinolone confirmed with pharmacy ld

## 2012-01-25 DIAGNOSIS — L821 Other seborrheic keratosis: Secondary | ICD-10-CM | POA: Diagnosis not present

## 2012-01-25 DIAGNOSIS — L219 Seborrheic dermatitis, unspecified: Secondary | ICD-10-CM | POA: Diagnosis not present

## 2012-01-25 DIAGNOSIS — D692 Other nonthrombocytopenic purpura: Secondary | ICD-10-CM | POA: Diagnosis not present

## 2012-01-25 DIAGNOSIS — D239 Other benign neoplasm of skin, unspecified: Secondary | ICD-10-CM | POA: Diagnosis not present

## 2012-01-25 DIAGNOSIS — Z85828 Personal history of other malignant neoplasm of skin: Secondary | ICD-10-CM | POA: Diagnosis not present

## 2012-01-25 DIAGNOSIS — L719 Rosacea, unspecified: Secondary | ICD-10-CM | POA: Diagnosis not present

## 2012-02-15 DIAGNOSIS — K5900 Constipation, unspecified: Secondary | ICD-10-CM | POA: Diagnosis not present

## 2012-02-16 DIAGNOSIS — M069 Rheumatoid arthritis, unspecified: Secondary | ICD-10-CM | POA: Diagnosis not present

## 2012-02-16 DIAGNOSIS — Z79899 Other long term (current) drug therapy: Secondary | ICD-10-CM | POA: Diagnosis not present

## 2012-02-17 DIAGNOSIS — M159 Polyosteoarthritis, unspecified: Secondary | ICD-10-CM | POA: Diagnosis not present

## 2012-02-17 DIAGNOSIS — M069 Rheumatoid arthritis, unspecified: Secondary | ICD-10-CM | POA: Diagnosis not present

## 2012-02-17 DIAGNOSIS — M25579 Pain in unspecified ankle and joints of unspecified foot: Secondary | ICD-10-CM | POA: Diagnosis not present

## 2012-03-15 DIAGNOSIS — M069 Rheumatoid arthritis, unspecified: Secondary | ICD-10-CM | POA: Diagnosis not present

## 2012-03-21 DIAGNOSIS — K5900 Constipation, unspecified: Secondary | ICD-10-CM | POA: Diagnosis not present

## 2012-03-30 DIAGNOSIS — N6489 Other specified disorders of breast: Secondary | ICD-10-CM | POA: Diagnosis not present

## 2012-04-06 DIAGNOSIS — K5900 Constipation, unspecified: Secondary | ICD-10-CM | POA: Diagnosis not present

## 2012-04-19 DIAGNOSIS — M069 Rheumatoid arthritis, unspecified: Secondary | ICD-10-CM | POA: Diagnosis not present

## 2012-04-21 DIAGNOSIS — Z961 Presence of intraocular lens: Secondary | ICD-10-CM | POA: Diagnosis not present

## 2012-05-10 DIAGNOSIS — Z23 Encounter for immunization: Secondary | ICD-10-CM | POA: Diagnosis not present

## 2012-05-17 DIAGNOSIS — M069 Rheumatoid arthritis, unspecified: Secondary | ICD-10-CM | POA: Diagnosis not present

## 2012-05-26 DIAGNOSIS — R0989 Other specified symptoms and signs involving the circulatory and respiratory systems: Secondary | ICD-10-CM | POA: Diagnosis not present

## 2012-05-26 DIAGNOSIS — Z79899 Other long term (current) drug therapy: Secondary | ICD-10-CM | POA: Diagnosis not present

## 2012-05-26 DIAGNOSIS — R0609 Other forms of dyspnea: Secondary | ICD-10-CM | POA: Diagnosis not present

## 2012-05-26 DIAGNOSIS — M069 Rheumatoid arthritis, unspecified: Secondary | ICD-10-CM | POA: Diagnosis not present

## 2012-06-15 DIAGNOSIS — Z79899 Other long term (current) drug therapy: Secondary | ICD-10-CM | POA: Diagnosis not present

## 2012-06-15 DIAGNOSIS — M069 Rheumatoid arthritis, unspecified: Secondary | ICD-10-CM | POA: Diagnosis not present

## 2012-06-16 DIAGNOSIS — M159 Polyosteoarthritis, unspecified: Secondary | ICD-10-CM | POA: Diagnosis not present

## 2012-06-16 DIAGNOSIS — M899 Disorder of bone, unspecified: Secondary | ICD-10-CM | POA: Diagnosis not present

## 2012-06-16 DIAGNOSIS — M25579 Pain in unspecified ankle and joints of unspecified foot: Secondary | ICD-10-CM | POA: Diagnosis not present

## 2012-06-16 DIAGNOSIS — M069 Rheumatoid arthritis, unspecified: Secondary | ICD-10-CM | POA: Diagnosis not present

## 2012-06-29 DIAGNOSIS — M25579 Pain in unspecified ankle and joints of unspecified foot: Secondary | ICD-10-CM | POA: Diagnosis not present

## 2012-06-29 DIAGNOSIS — R609 Edema, unspecified: Secondary | ICD-10-CM | POA: Diagnosis not present

## 2012-07-14 DIAGNOSIS — M069 Rheumatoid arthritis, unspecified: Secondary | ICD-10-CM | POA: Diagnosis not present

## 2012-07-21 DIAGNOSIS — S93409A Sprain of unspecified ligament of unspecified ankle, initial encounter: Secondary | ICD-10-CM | POA: Diagnosis not present

## 2012-07-26 DIAGNOSIS — S93409A Sprain of unspecified ligament of unspecified ankle, initial encounter: Secondary | ICD-10-CM | POA: Diagnosis not present

## 2012-08-02 ENCOUNTER — Encounter (HOSPITAL_BASED_OUTPATIENT_CLINIC_OR_DEPARTMENT_OTHER): Payer: Medicare Other | Attending: General Surgery

## 2012-08-02 DIAGNOSIS — S81009A Unspecified open wound, unspecified knee, initial encounter: Secondary | ICD-10-CM | POA: Diagnosis not present

## 2012-08-02 DIAGNOSIS — I872 Venous insufficiency (chronic) (peripheral): Secondary | ICD-10-CM | POA: Insufficient documentation

## 2012-08-02 DIAGNOSIS — S91009A Unspecified open wound, unspecified ankle, initial encounter: Secondary | ICD-10-CM | POA: Diagnosis not present

## 2012-08-02 DIAGNOSIS — Z79899 Other long term (current) drug therapy: Secondary | ICD-10-CM | POA: Diagnosis not present

## 2012-08-02 DIAGNOSIS — I83009 Varicose veins of unspecified lower extremity with ulcer of unspecified site: Secondary | ICD-10-CM | POA: Diagnosis not present

## 2012-08-02 DIAGNOSIS — M069 Rheumatoid arthritis, unspecified: Secondary | ICD-10-CM | POA: Diagnosis not present

## 2012-08-02 DIAGNOSIS — I87309 Chronic venous hypertension (idiopathic) without complications of unspecified lower extremity: Secondary | ICD-10-CM | POA: Diagnosis not present

## 2012-08-02 DIAGNOSIS — M1990 Unspecified osteoarthritis, unspecified site: Secondary | ICD-10-CM | POA: Diagnosis not present

## 2012-08-02 DIAGNOSIS — X58XXXA Exposure to other specified factors, initial encounter: Secondary | ICD-10-CM | POA: Insufficient documentation

## 2012-08-03 ENCOUNTER — Other Ambulatory Visit: Payer: Self-pay | Admitting: Internal Medicine

## 2012-08-03 ENCOUNTER — Ambulatory Visit
Admission: RE | Admit: 2012-08-03 | Discharge: 2012-08-03 | Disposition: A | Discharge status: Home / Self Care | Payer: Medicare Other | Source: Ambulatory Visit | Attending: Internal Medicine | Admitting: Internal Medicine

## 2012-08-03 DIAGNOSIS — R609 Edema, unspecified: Secondary | ICD-10-CM

## 2012-08-03 DIAGNOSIS — M7989 Other specified soft tissue disorders: Secondary | ICD-10-CM | POA: Diagnosis not present

## 2012-08-03 DIAGNOSIS — Z79899 Other long term (current) drug therapy: Secondary | ICD-10-CM | POA: Diagnosis not present

## 2012-08-03 NOTE — H&P (Signed)
[name], [name] NO.:  [id number]  MEDICAL RECORD NO.:  [id number]  LOCATION:  FOOT                         FACILITY:  [facility]  PHYSICIAN:  Joanne Gavel, M.D.        DATE OF BIRTH:  January 24, 1941  DATE OF ADMISSION:  08/02/2012 DATE OF DISCHARGE:                             HISTORY & PHYSICAL   CHIEF COMPLAINT:  Wound, left leg.  HISTORY OF PRESENT ILLNESS:  This is a 72 year old female with a history of chronic venous hypertension with some stasis dermatitis in her leg approximately 1 month ago.  She does not remember when or how.  She has been treating this wound with triple antibiotic ointment.  PAST MEDICAL HISTORY:  The patient has rheumatoid arthritis and osteoarthritis, both being treated with prednisone.  She has had a history of pericarditis and Staph infection of the right hand in 2007. She has asthma.  PAST SURGICAL HISTORY:  She has had right cataract surgery, bunionectomy, and hammertoe operations in 2012, left thigh post herpetic neuralgia, cholecystectomy in 2009.  SOCIAL HISTORY:  Cigarettes:  None.  Alcohol:  None.  MEDICATIONS:  Nexium, Effexor, amitriptyline, Neurontin, tramadol, Ocuvite, Zyrtec with Remicade, Benadryl with Remicade, and Remicade infusion monthly.  ALLERGIES:  PENICILLIN and SULFA both cause a rash.  PHYSICAL EXAMINATION:  VITAL SIGNS:  Temperature 98.3, pulse 75, respirations 18, blood pressure 146/75. GENERAL APPEARANCE:  Well developed, well nourished, no distress. HEAD EYES EARS, NOSE, THROAT:  Essentially normal. CHEST:  Clear. HEART:  Regular rhythm. ABDOMEN:  Not examined. EXTREMITIES:  Reveals good peripheral pulses.  There is a 0.9 x 0.3 laceration on the right anterior leg at an area of stasis dermatitis.  ABI is 1.2.  IMPRESSION:  Trauma in addition to chronic venous hypertension.  We will start treatment with Santyl, silver alginate, and Unna boot.  See the patient in 7 days.     Joanne Gavel,  M.D.     RA/MEDQ  D:  08/02/2012  T:  08/03/2012  Job:  [id number]

## 2012-08-09 DIAGNOSIS — M069 Rheumatoid arthritis, unspecified: Secondary | ICD-10-CM | POA: Diagnosis not present

## 2012-08-09 DIAGNOSIS — S81009A Unspecified open wound, unspecified knee, initial encounter: Secondary | ICD-10-CM | POA: Diagnosis not present

## 2012-08-09 DIAGNOSIS — S91009A Unspecified open wound, unspecified ankle, initial encounter: Secondary | ICD-10-CM | POA: Diagnosis not present

## 2012-08-09 DIAGNOSIS — I872 Venous insufficiency (chronic) (peripheral): Secondary | ICD-10-CM | POA: Diagnosis not present

## 2012-08-09 DIAGNOSIS — I87309 Chronic venous hypertension (idiopathic) without complications of unspecified lower extremity: Secondary | ICD-10-CM | POA: Diagnosis not present

## 2012-08-09 NOTE — Progress Notes (Signed)
[facility]  NAME:  RAMON, BRANT             ACCOUNT NO.:  [id number]  MEDICAL RECORD NO.:  [id number]      DATE OF BIRTH:  1940-09-06  PHYSICIAN:  Joanne Gavel, M.D.         VISIT DATE:  08/09/2012                                  OFFICE VISIT   ADMISSION DATA:  This patient had a small ulcer in the midst of stasis dermatitis.  She was treated with pressure support and Santyl, and on the day of discharge, the wound was completely healed.  DISCHARGE INSTRUCTIONS:  Elevation, support hose, and see [place] p.r.n.     Joanne Gavel, M.D.     RA/MEDQ  D:  08/09/2012  T:  08/09/2012  Job:  [id number]

## 2012-08-15 DIAGNOSIS — M069 Rheumatoid arthritis, unspecified: Secondary | ICD-10-CM | POA: Diagnosis not present

## 2012-09-05 DIAGNOSIS — S93409A Sprain of unspecified ligament of unspecified ankle, initial encounter: Secondary | ICD-10-CM | POA: Diagnosis not present

## 2012-09-05 DIAGNOSIS — M6281 Muscle weakness (generalized): Secondary | ICD-10-CM | POA: Diagnosis not present

## 2012-09-05 DIAGNOSIS — M069 Rheumatoid arthritis, unspecified: Secondary | ICD-10-CM | POA: Diagnosis not present

## 2012-09-05 DIAGNOSIS — M2560 Stiffness of unspecified joint, not elsewhere classified: Secondary | ICD-10-CM | POA: Diagnosis not present

## 2012-09-08 DIAGNOSIS — M6281 Muscle weakness (generalized): Secondary | ICD-10-CM | POA: Diagnosis not present

## 2012-09-08 DIAGNOSIS — M069 Rheumatoid arthritis, unspecified: Secondary | ICD-10-CM | POA: Diagnosis not present

## 2012-09-08 DIAGNOSIS — S93409A Sprain of unspecified ligament of unspecified ankle, initial encounter: Secondary | ICD-10-CM | POA: Diagnosis not present

## 2012-09-08 DIAGNOSIS — M2560 Stiffness of unspecified joint, not elsewhere classified: Secondary | ICD-10-CM | POA: Diagnosis not present

## 2012-09-12 DIAGNOSIS — M069 Rheumatoid arthritis, unspecified: Secondary | ICD-10-CM | POA: Diagnosis not present

## 2012-09-13 DIAGNOSIS — S93409A Sprain of unspecified ligament of unspecified ankle, initial encounter: Secondary | ICD-10-CM | POA: Diagnosis not present

## 2012-09-13 DIAGNOSIS — M6281 Muscle weakness (generalized): Secondary | ICD-10-CM | POA: Diagnosis not present

## 2012-09-13 DIAGNOSIS — M2560 Stiffness of unspecified joint, not elsewhere classified: Secondary | ICD-10-CM | POA: Diagnosis not present

## 2012-09-13 DIAGNOSIS — M069 Rheumatoid arthritis, unspecified: Secondary | ICD-10-CM | POA: Diagnosis not present

## 2012-09-14 DIAGNOSIS — R609 Edema, unspecified: Secondary | ICD-10-CM | POA: Diagnosis not present

## 2012-09-15 DIAGNOSIS — M6281 Muscle weakness (generalized): Secondary | ICD-10-CM | POA: Diagnosis not present

## 2012-09-15 DIAGNOSIS — M2560 Stiffness of unspecified joint, not elsewhere classified: Secondary | ICD-10-CM | POA: Diagnosis not present

## 2012-09-15 DIAGNOSIS — S93409A Sprain of unspecified ligament of unspecified ankle, initial encounter: Secondary | ICD-10-CM | POA: Diagnosis not present

## 2012-09-15 DIAGNOSIS — M069 Rheumatoid arthritis, unspecified: Secondary | ICD-10-CM | POA: Diagnosis not present

## 2012-09-20 DIAGNOSIS — M2560 Stiffness of unspecified joint, not elsewhere classified: Secondary | ICD-10-CM | POA: Diagnosis not present

## 2012-09-20 DIAGNOSIS — M069 Rheumatoid arthritis, unspecified: Secondary | ICD-10-CM | POA: Diagnosis not present

## 2012-09-20 DIAGNOSIS — S93409A Sprain of unspecified ligament of unspecified ankle, initial encounter: Secondary | ICD-10-CM | POA: Diagnosis not present

## 2012-09-20 DIAGNOSIS — M6281 Muscle weakness (generalized): Secondary | ICD-10-CM | POA: Diagnosis not present

## 2012-09-26 DIAGNOSIS — M2560 Stiffness of unspecified joint, not elsewhere classified: Secondary | ICD-10-CM | POA: Diagnosis not present

## 2012-09-26 DIAGNOSIS — S93409A Sprain of unspecified ligament of unspecified ankle, initial encounter: Secondary | ICD-10-CM | POA: Diagnosis not present

## 2012-09-26 DIAGNOSIS — M6281 Muscle weakness (generalized): Secondary | ICD-10-CM | POA: Diagnosis not present

## 2012-09-26 DIAGNOSIS — M069 Rheumatoid arthritis, unspecified: Secondary | ICD-10-CM | POA: Diagnosis not present

## 2012-09-28 DIAGNOSIS — M2560 Stiffness of unspecified joint, not elsewhere classified: Secondary | ICD-10-CM | POA: Diagnosis not present

## 2012-09-28 DIAGNOSIS — M6281 Muscle weakness (generalized): Secondary | ICD-10-CM | POA: Diagnosis not present

## 2012-09-28 DIAGNOSIS — S93409A Sprain of unspecified ligament of unspecified ankle, initial encounter: Secondary | ICD-10-CM | POA: Diagnosis not present

## 2012-09-28 DIAGNOSIS — M069 Rheumatoid arthritis, unspecified: Secondary | ICD-10-CM | POA: Diagnosis not present

## 2012-09-29 DIAGNOSIS — R0609 Other forms of dyspnea: Secondary | ICD-10-CM | POA: Diagnosis not present

## 2012-09-29 DIAGNOSIS — M069 Rheumatoid arthritis, unspecified: Secondary | ICD-10-CM | POA: Diagnosis not present

## 2012-09-29 DIAGNOSIS — Z79899 Other long term (current) drug therapy: Secondary | ICD-10-CM | POA: Diagnosis not present

## 2012-09-29 DIAGNOSIS — M810 Age-related osteoporosis without current pathological fracture: Secondary | ICD-10-CM | POA: Diagnosis not present

## 2012-09-29 DIAGNOSIS — L989 Disorder of the skin and subcutaneous tissue, unspecified: Secondary | ICD-10-CM | POA: Diagnosis not present

## 2012-10-03 DIAGNOSIS — M2560 Stiffness of unspecified joint, not elsewhere classified: Secondary | ICD-10-CM | POA: Diagnosis not present

## 2012-10-03 DIAGNOSIS — S93409A Sprain of unspecified ligament of unspecified ankle, initial encounter: Secondary | ICD-10-CM | POA: Diagnosis not present

## 2012-10-03 DIAGNOSIS — M6281 Muscle weakness (generalized): Secondary | ICD-10-CM | POA: Diagnosis not present

## 2012-10-03 DIAGNOSIS — M069 Rheumatoid arthritis, unspecified: Secondary | ICD-10-CM | POA: Diagnosis not present

## 2012-10-06 DIAGNOSIS — M069 Rheumatoid arthritis, unspecified: Secondary | ICD-10-CM | POA: Diagnosis not present

## 2012-10-06 DIAGNOSIS — M6281 Muscle weakness (generalized): Secondary | ICD-10-CM | POA: Diagnosis not present

## 2012-10-06 DIAGNOSIS — M2560 Stiffness of unspecified joint, not elsewhere classified: Secondary | ICD-10-CM | POA: Diagnosis not present

## 2012-10-06 DIAGNOSIS — S93409A Sprain of unspecified ligament of unspecified ankle, initial encounter: Secondary | ICD-10-CM | POA: Diagnosis not present

## 2012-10-10 DIAGNOSIS — M069 Rheumatoid arthritis, unspecified: Secondary | ICD-10-CM | POA: Diagnosis not present

## 2012-10-11 DIAGNOSIS — M6281 Muscle weakness (generalized): Secondary | ICD-10-CM | POA: Diagnosis not present

## 2012-10-11 DIAGNOSIS — M069 Rheumatoid arthritis, unspecified: Secondary | ICD-10-CM | POA: Diagnosis not present

## 2012-10-11 DIAGNOSIS — M2560 Stiffness of unspecified joint, not elsewhere classified: Secondary | ICD-10-CM | POA: Diagnosis not present

## 2012-10-11 DIAGNOSIS — S93409A Sprain of unspecified ligament of unspecified ankle, initial encounter: Secondary | ICD-10-CM | POA: Diagnosis not present

## 2012-10-15 DIAGNOSIS — S81009A Unspecified open wound, unspecified knee, initial encounter: Secondary | ICD-10-CM | POA: Diagnosis not present

## 2012-10-18 DIAGNOSIS — T148XXA Other injury of unspecified body region, initial encounter: Secondary | ICD-10-CM | POA: Diagnosis not present

## 2012-10-18 DIAGNOSIS — M069 Rheumatoid arthritis, unspecified: Secondary | ICD-10-CM | POA: Diagnosis not present

## 2012-10-18 DIAGNOSIS — M2560 Stiffness of unspecified joint, not elsewhere classified: Secondary | ICD-10-CM | POA: Diagnosis not present

## 2012-10-18 DIAGNOSIS — D849 Immunodeficiency, unspecified: Secondary | ICD-10-CM | POA: Diagnosis not present

## 2012-10-18 DIAGNOSIS — M6281 Muscle weakness (generalized): Secondary | ICD-10-CM | POA: Diagnosis not present

## 2012-10-18 DIAGNOSIS — S93409A Sprain of unspecified ligament of unspecified ankle, initial encounter: Secondary | ICD-10-CM | POA: Diagnosis not present

## 2012-10-21 DIAGNOSIS — M6281 Muscle weakness (generalized): Secondary | ICD-10-CM | POA: Diagnosis not present

## 2012-10-21 DIAGNOSIS — S93409A Sprain of unspecified ligament of unspecified ankle, initial encounter: Secondary | ICD-10-CM | POA: Diagnosis not present

## 2012-10-21 DIAGNOSIS — M069 Rheumatoid arthritis, unspecified: Secondary | ICD-10-CM | POA: Diagnosis not present

## 2012-10-21 DIAGNOSIS — M2560 Stiffness of unspecified joint, not elsewhere classified: Secondary | ICD-10-CM | POA: Diagnosis not present

## 2012-10-25 DIAGNOSIS — S93409A Sprain of unspecified ligament of unspecified ankle, initial encounter: Secondary | ICD-10-CM | POA: Diagnosis not present

## 2012-10-25 DIAGNOSIS — M6281 Muscle weakness (generalized): Secondary | ICD-10-CM | POA: Diagnosis not present

## 2012-10-25 DIAGNOSIS — M2560 Stiffness of unspecified joint, not elsewhere classified: Secondary | ICD-10-CM | POA: Diagnosis not present

## 2012-10-25 DIAGNOSIS — M069 Rheumatoid arthritis, unspecified: Secondary | ICD-10-CM | POA: Diagnosis not present

## 2012-10-27 DIAGNOSIS — M2560 Stiffness of unspecified joint, not elsewhere classified: Secondary | ICD-10-CM | POA: Diagnosis not present

## 2012-10-27 DIAGNOSIS — M069 Rheumatoid arthritis, unspecified: Secondary | ICD-10-CM | POA: Diagnosis not present

## 2012-10-27 DIAGNOSIS — S93409A Sprain of unspecified ligament of unspecified ankle, initial encounter: Secondary | ICD-10-CM | POA: Diagnosis not present

## 2012-10-27 DIAGNOSIS — M6281 Muscle weakness (generalized): Secondary | ICD-10-CM | POA: Diagnosis not present

## 2012-10-31 DIAGNOSIS — M6281 Muscle weakness (generalized): Secondary | ICD-10-CM | POA: Diagnosis not present

## 2012-10-31 DIAGNOSIS — S93409A Sprain of unspecified ligament of unspecified ankle, initial encounter: Secondary | ICD-10-CM | POA: Diagnosis not present

## 2012-10-31 DIAGNOSIS — M069 Rheumatoid arthritis, unspecified: Secondary | ICD-10-CM | POA: Diagnosis not present

## 2012-10-31 DIAGNOSIS — M2560 Stiffness of unspecified joint, not elsewhere classified: Secondary | ICD-10-CM | POA: Diagnosis not present

## 2012-11-08 DIAGNOSIS — M069 Rheumatoid arthritis, unspecified: Secondary | ICD-10-CM | POA: Diagnosis not present

## 2012-11-08 DIAGNOSIS — M6281 Muscle weakness (generalized): Secondary | ICD-10-CM | POA: Diagnosis not present

## 2012-11-08 DIAGNOSIS — S93409A Sprain of unspecified ligament of unspecified ankle, initial encounter: Secondary | ICD-10-CM | POA: Diagnosis not present

## 2012-11-08 DIAGNOSIS — M2560 Stiffness of unspecified joint, not elsewhere classified: Secondary | ICD-10-CM | POA: Diagnosis not present

## 2012-11-09 DIAGNOSIS — M069 Rheumatoid arthritis, unspecified: Secondary | ICD-10-CM | POA: Diagnosis not present

## 2012-11-10 DIAGNOSIS — M2560 Stiffness of unspecified joint, not elsewhere classified: Secondary | ICD-10-CM | POA: Diagnosis not present

## 2012-11-10 DIAGNOSIS — S93409A Sprain of unspecified ligament of unspecified ankle, initial encounter: Secondary | ICD-10-CM | POA: Diagnosis not present

## 2012-11-10 DIAGNOSIS — M069 Rheumatoid arthritis, unspecified: Secondary | ICD-10-CM | POA: Diagnosis not present

## 2012-11-10 DIAGNOSIS — M6281 Muscle weakness (generalized): Secondary | ICD-10-CM | POA: Diagnosis not present

## 2012-11-17 DIAGNOSIS — M2560 Stiffness of unspecified joint, not elsewhere classified: Secondary | ICD-10-CM | POA: Diagnosis not present

## 2012-11-17 DIAGNOSIS — M069 Rheumatoid arthritis, unspecified: Secondary | ICD-10-CM | POA: Diagnosis not present

## 2012-11-17 DIAGNOSIS — M6281 Muscle weakness (generalized): Secondary | ICD-10-CM | POA: Diagnosis not present

## 2012-11-17 DIAGNOSIS — S93409A Sprain of unspecified ligament of unspecified ankle, initial encounter: Secondary | ICD-10-CM | POA: Diagnosis not present

## 2012-12-01 DIAGNOSIS — M069 Rheumatoid arthritis, unspecified: Secondary | ICD-10-CM | POA: Diagnosis not present

## 2012-12-27 DIAGNOSIS — M069 Rheumatoid arthritis, unspecified: Secondary | ICD-10-CM | POA: Diagnosis not present

## 2012-12-27 DIAGNOSIS — Z006 Encounter for examination for normal comparison and control in clinical research program: Secondary | ICD-10-CM | POA: Diagnosis not present

## 2012-12-27 DIAGNOSIS — J189 Pneumonia, unspecified organism: Secondary | ICD-10-CM | POA: Diagnosis not present

## 2012-12-27 DIAGNOSIS — R5381 Other malaise: Secondary | ICD-10-CM | POA: Diagnosis not present

## 2012-12-29 DIAGNOSIS — M069 Rheumatoid arthritis, unspecified: Secondary | ICD-10-CM | POA: Diagnosis not present

## 2012-12-29 DIAGNOSIS — B0229 Other postherpetic nervous system involvement: Secondary | ICD-10-CM | POA: Diagnosis not present

## 2012-12-29 DIAGNOSIS — Z79899 Other long term (current) drug therapy: Secondary | ICD-10-CM | POA: Diagnosis not present

## 2013-01-02 DIAGNOSIS — M159 Polyosteoarthritis, unspecified: Secondary | ICD-10-CM | POA: Diagnosis not present

## 2013-01-02 DIAGNOSIS — M25579 Pain in unspecified ankle and joints of unspecified foot: Secondary | ICD-10-CM | POA: Diagnosis not present

## 2013-01-02 DIAGNOSIS — M069 Rheumatoid arthritis, unspecified: Secondary | ICD-10-CM | POA: Diagnosis not present

## 2013-01-02 DIAGNOSIS — M949 Disorder of cartilage, unspecified: Secondary | ICD-10-CM | POA: Diagnosis not present

## 2013-01-02 DIAGNOSIS — M899 Disorder of bone, unspecified: Secondary | ICD-10-CM | POA: Diagnosis not present

## 2013-01-03 DIAGNOSIS — J189 Pneumonia, unspecified organism: Secondary | ICD-10-CM | POA: Diagnosis not present

## 2013-01-17 DIAGNOSIS — R233 Spontaneous ecchymoses: Secondary | ICD-10-CM | POA: Diagnosis not present

## 2013-01-17 DIAGNOSIS — J189 Pneumonia, unspecified organism: Secondary | ICD-10-CM | POA: Diagnosis not present

## 2013-02-03 DIAGNOSIS — M069 Rheumatoid arthritis, unspecified: Secondary | ICD-10-CM | POA: Diagnosis not present

## 2013-02-03 DIAGNOSIS — Z79899 Other long term (current) drug therapy: Secondary | ICD-10-CM | POA: Diagnosis not present

## 2013-02-10 DIAGNOSIS — Z79899 Other long term (current) drug therapy: Secondary | ICD-10-CM | POA: Diagnosis not present

## 2013-02-10 DIAGNOSIS — I10 Essential (primary) hypertension: Secondary | ICD-10-CM | POA: Diagnosis not present

## 2013-02-10 DIAGNOSIS — Z0000 Encounter for general adult medical examination without abnormal findings: Secondary | ICD-10-CM | POA: Diagnosis not present

## 2013-02-16 DIAGNOSIS — Z0000 Encounter for general adult medical examination without abnormal findings: Secondary | ICD-10-CM | POA: Diagnosis not present

## 2013-02-16 DIAGNOSIS — M069 Rheumatoid arthritis, unspecified: Secondary | ICD-10-CM | POA: Diagnosis not present

## 2013-02-16 DIAGNOSIS — M159 Polyosteoarthritis, unspecified: Secondary | ICD-10-CM | POA: Diagnosis not present

## 2013-02-16 DIAGNOSIS — I059 Rheumatic mitral valve disease, unspecified: Secondary | ICD-10-CM | POA: Diagnosis not present

## 2013-02-16 DIAGNOSIS — R609 Edema, unspecified: Secondary | ICD-10-CM | POA: Diagnosis not present

## 2013-02-23 DIAGNOSIS — M069 Rheumatoid arthritis, unspecified: Secondary | ICD-10-CM | POA: Diagnosis not present

## 2013-03-06 DIAGNOSIS — Z79899 Other long term (current) drug therapy: Secondary | ICD-10-CM | POA: Diagnosis not present

## 2013-03-06 DIAGNOSIS — M069 Rheumatoid arthritis, unspecified: Secondary | ICD-10-CM | POA: Diagnosis not present

## 2013-03-22 DIAGNOSIS — L821 Other seborrheic keratosis: Secondary | ICD-10-CM | POA: Diagnosis not present

## 2013-03-22 DIAGNOSIS — D239 Other benign neoplasm of skin, unspecified: Secondary | ICD-10-CM | POA: Diagnosis not present

## 2013-03-22 DIAGNOSIS — L738 Other specified follicular disorders: Secondary | ICD-10-CM | POA: Diagnosis not present

## 2013-03-22 DIAGNOSIS — Z85828 Personal history of other malignant neoplasm of skin: Secondary | ICD-10-CM | POA: Diagnosis not present

## 2013-03-23 DIAGNOSIS — M069 Rheumatoid arthritis, unspecified: Secondary | ICD-10-CM | POA: Diagnosis not present

## 2013-04-03 DIAGNOSIS — M069 Rheumatoid arthritis, unspecified: Secondary | ICD-10-CM | POA: Diagnosis not present

## 2013-04-03 DIAGNOSIS — M899 Disorder of bone, unspecified: Secondary | ICD-10-CM | POA: Diagnosis not present

## 2013-04-03 DIAGNOSIS — Z13820 Encounter for screening for osteoporosis: Secondary | ICD-10-CM | POA: Diagnosis not present

## 2013-04-03 DIAGNOSIS — Z23 Encounter for immunization: Secondary | ICD-10-CM | POA: Diagnosis not present

## 2013-04-03 DIAGNOSIS — Z79899 Other long term (current) drug therapy: Secondary | ICD-10-CM | POA: Diagnosis not present

## 2013-04-07 DIAGNOSIS — Z01419 Encounter for gynecological examination (general) (routine) without abnormal findings: Secondary | ICD-10-CM | POA: Diagnosis not present

## 2013-04-07 DIAGNOSIS — Z124 Encounter for screening for malignant neoplasm of cervix: Secondary | ICD-10-CM | POA: Diagnosis not present

## 2013-04-07 DIAGNOSIS — L940 Localized scleroderma [morphea]: Secondary | ICD-10-CM | POA: Diagnosis not present

## 2013-04-07 DIAGNOSIS — R188 Other ascites: Secondary | ICD-10-CM | POA: Diagnosis not present

## 2013-04-07 DIAGNOSIS — M899 Disorder of bone, unspecified: Secondary | ICD-10-CM | POA: Diagnosis not present

## 2013-04-19 DIAGNOSIS — M069 Rheumatoid arthritis, unspecified: Secondary | ICD-10-CM | POA: Diagnosis not present

## 2013-04-21 DIAGNOSIS — Z1231 Encounter for screening mammogram for malignant neoplasm of breast: Secondary | ICD-10-CM | POA: Diagnosis not present

## 2013-04-27 DIAGNOSIS — Z961 Presence of intraocular lens: Secondary | ICD-10-CM | POA: Diagnosis not present

## 2013-05-10 DIAGNOSIS — J189 Pneumonia, unspecified organism: Secondary | ICD-10-CM | POA: Diagnosis not present

## 2013-05-10 DIAGNOSIS — J45909 Unspecified asthma, uncomplicated: Secondary | ICD-10-CM | POA: Diagnosis not present

## 2013-05-11 DIAGNOSIS — R188 Other ascites: Secondary | ICD-10-CM | POA: Diagnosis not present

## 2013-05-16 ENCOUNTER — Other Ambulatory Visit: Payer: Self-pay | Admitting: Internal Medicine

## 2013-05-16 DIAGNOSIS — R911 Solitary pulmonary nodule: Secondary | ICD-10-CM

## 2013-05-17 ENCOUNTER — Ambulatory Visit
Admission: RE | Admit: 2013-05-17 | Discharge: 2013-05-17 | Disposition: A | Discharge status: Home / Self Care | Payer: Medicare Other | Source: Ambulatory Visit | Attending: Internal Medicine | Admitting: Internal Medicine

## 2013-05-17 DIAGNOSIS — J984 Other disorders of lung: Secondary | ICD-10-CM | POA: Diagnosis not present

## 2013-05-17 DIAGNOSIS — R911 Solitary pulmonary nodule: Secondary | ICD-10-CM

## 2013-05-24 ENCOUNTER — Institutional Professional Consult (permissible substitution): Payer: Medicare Other | Admitting: Internal Medicine

## 2013-05-24 DIAGNOSIS — M069 Rheumatoid arthritis, unspecified: Secondary | ICD-10-CM | POA: Diagnosis not present

## 2013-05-26 ENCOUNTER — Encounter: Payer: Self-pay | Admitting: Internal Medicine

## 2013-05-26 ENCOUNTER — Ambulatory Visit (INDEPENDENT_AMBULATORY_CARE_PROVIDER_SITE_OTHER): Payer: Medicare Other | Admitting: Internal Medicine

## 2013-05-26 VITALS — BP 122/66 | HR 77 | Temp 97.7°F | Ht 65.25 in | Wt 147.0 lb

## 2013-05-26 DIAGNOSIS — R918 Other nonspecific abnormal finding of lung field: Secondary | ICD-10-CM | POA: Diagnosis not present

## 2013-05-26 DIAGNOSIS — R05 Cough: Secondary | ICD-10-CM

## 2013-05-26 NOTE — Progress Notes (Signed)
  Subjective:    Patient ID: Stacey [name], female    DOB: Nov 21, 1940  MRN: [id number]  HPI  25 yowf never smoker with RA since around 2000  Prednisone dep 4 a/w 6 x decades and prev eval by Dr Stacey [name] around 2004 for sob resolved s maint rx and referred 05/26/2013 by Dr Stacey [name] for bronchitis and abn cxr   05/26/2013 1st  Pulmonary office visit/ Stacey [name] cc June 2014 dx pna  In [place] and remicade stopped and 100% better and placed arencia in September 2014  then abruptly worse first week in November with cough green sputum s nasal symptoms, fever low grade and no cp or cough and completely recovered prior to OV does not recall abx but issue is why keeps getting sick and abn CT Chest (see below).    Arthritis symptoms well controlled at present on Rx for RA   No obvious day to day or daytime variabilty or assoc sob or cp or chest tightness, subjective wheeze overt sinus or hb symptoms. No unusual exp hx or h/o childhood pna/ asthma or knowledge of premature birth.  Sleeping ok without nocturnal  or early am exacerbation  of respiratory  c/o's or need for noct saba. Also denies any obvious fluctuation of symptoms with weather or environmental changes or other aggravating or alleviating factors except as outlined above   Current Medications, Allergies, Complete Past Medical History, Past Surgical History, Family History, and Social History were reviewed in [employer] record.             Review of Systems  Constitutional: Negative for fever, chills and unexpected weight change.  HENT: Negative for congestion, dental problem, ear pain, nosebleeds, postnasal drip, rhinorrhea, sinus pressure, sneezing, sore throat, trouble swallowing and voice change.   Eyes: Negative for visual disturbance.  Respiratory: Negative for cough, choking and shortness of breath.   Cardiovascular: Negative for chest pain and leg swelling.  Gastrointestinal: Negative for vomiting,  abdominal pain and diarrhea.  Genitourinary: Negative for difficulty urinating.  Musculoskeletal: Negative for arthralgias.  Skin: Negative for rash.  Neurological: Negative for tremors, syncope and headaches.  Hematological: Does not bruise/bleed easily.       Objective:   Physical Exam  Wt Readings from Last 3 Encounters:  05/26/13 147 lb (66.679 kg)  01/13/12 145 lb (65.772 kg)      HEENT: nl dentition, turbinates, and orophanx. Nl external ear canals without cough reflex   NECK :  without JVD/Nodes/TM/ nl carotid upstrokes bilaterally   LUNGS: no acc muscle use, clear to A and P bilaterally without cough on insp or exp maneuvers   CV:  RRR  no s3 or murmur or increase in P2, no edema   ABD:  soft and nontender with nl excursion in the supine position. No bruits or organomegaly, bowel sounds nl  MS:  warm without deformities, calf tenderness, cyanosis or clubbing  SKIN: warm and dry without lesions    NEURO:  alert, approp, no deficits    CT 05/16/13 1. The suspected pulmonary nodule in the right lung apex is actually  a calcified posterior pleural plaque and is benign in appearance.  2. Nodularity in the lateral aspect of the right upper lobe is felt  to be inflammatory in origin. However, there is a 9 mm nodule in  this area.      Assessment & Plan:

## 2013-05-26 NOTE — Patient Instructions (Signed)
Nexium 40 mg Take 30-60 min before first meal of the day and add pepcid 20 mg one at bedtime whenever coughing.  Please schedule a follow up office visit in 6 weeks, call sooner if needed with pfts (after first of year is fine)

## 2013-05-29 DIAGNOSIS — J45991 Cough variant asthma: Secondary | ICD-10-CM | POA: Insufficient documentation

## 2013-05-29 DIAGNOSIS — R918 Other nonspecific abnormal finding of lung field: Secondary | ICD-10-CM | POA: Insufficient documentation

## 2013-05-29 NOTE — Assessment & Plan Note (Signed)
Has resolved for now but I rec trial of gerd rx to see what impact this has on any of her recurrent symptoms pending return here in 6 weeks to do f/u pft's and make sure there is no evidence of resp bronchiolitis and in meantime pull the old office record for review of Dr Lynelle Doctor previous w/u.

## 2013-05-29 NOTE — Assessment & Plan Note (Signed)
In a never smoker with RA and two recent episodes suggestive of bronchopna or pna with a nl cxr in 12/2012 I suspect this will turn out to all be inflammatory and very unlikely malignant but to be 100% sure will need to be followed over the next 6 months  Placed in our tickle file to be sure she has f/u CT 11/13/12 as rec by radiology

## 2013-06-28 DIAGNOSIS — M069 Rheumatoid arthritis, unspecified: Secondary | ICD-10-CM | POA: Diagnosis not present

## 2013-07-04 DIAGNOSIS — M25579 Pain in unspecified ankle and joints of unspecified foot: Secondary | ICD-10-CM | POA: Diagnosis not present

## 2013-07-04 DIAGNOSIS — M159 Polyosteoarthritis, unspecified: Secondary | ICD-10-CM | POA: Diagnosis not present

## 2013-07-04 DIAGNOSIS — M069 Rheumatoid arthritis, unspecified: Secondary | ICD-10-CM | POA: Diagnosis not present

## 2013-07-04 DIAGNOSIS — M899 Disorder of bone, unspecified: Secondary | ICD-10-CM | POA: Diagnosis not present

## 2013-07-17 ENCOUNTER — Ambulatory Visit (INDEPENDENT_AMBULATORY_CARE_PROVIDER_SITE_OTHER): Payer: Medicare Other | Admitting: Internal Medicine

## 2013-07-17 ENCOUNTER — Encounter: Payer: Self-pay | Admitting: Internal Medicine

## 2013-07-17 VITALS — BP 124/60 | HR 70 | Ht 66.0 in | Wt 146.0 lb

## 2013-07-17 DIAGNOSIS — R059 Cough, unspecified: Secondary | ICD-10-CM

## 2013-07-17 DIAGNOSIS — J45909 Unspecified asthma, uncomplicated: Secondary | ICD-10-CM

## 2013-07-17 DIAGNOSIS — R05 Cough: Secondary | ICD-10-CM

## 2013-07-17 DIAGNOSIS — J984 Other disorders of lung: Secondary | ICD-10-CM

## 2013-07-17 DIAGNOSIS — R918 Other nonspecific abnormal finding of lung field: Secondary | ICD-10-CM

## 2013-07-17 LAB — PULMONARY FUNCTION TEST
DL/VA % pred: 85 %
DL/VA: 4.32 ml/min/mmHg/L
DLCO unc % pred: 69 %
DLCO unc: 18.85 ml/min/mmHg
FEF 25-75 Post: 1.9 L/sec
FEF 25-75 Pre: 1.74 L/sec
FEF2575-%Change-Post: 9 %
FEF2575-%Pred-Post: 102 %
FEF2575-%Pred-Pre: 94 %
FEV1-%Change-Post: 6 %
FEV1-%Pred-Post: 78 %
FEV1-%Pred-Pre: 73 %
FEV1-Post: 1.85 L
FEV1-Pre: 1.73 L
FEV1FVC-%Change-Post: 1 %
FEV1FVC-%Pred-Pre: 108 %
FEV6-%Change-Post: 5 %
FEV6-%Pred-Post: 75 %
FEV6-%Pred-Pre: 71 %
FEV6-Post: 2.24 L
FEV6-Pre: 2.13 L
FEV6FVC-%Pred-Post: 105 %
FEV6FVC-%Pred-Pre: 105 %
FVC-%Change-Post: 5 %
FVC-%Pred-Post: 71 %
FVC-%Pred-Pre: 68 %
FVC-Post: 2.24 L
FVC-Pre: 2.13 L
Post FEV1/FVC ratio: 82 %
Post FEV6/FVC ratio: 100 %
Pre FEV1/FVC ratio: 81 %
Pre FEV6/FVC Ratio: 100 %
RV % pred: 107 %
RV: 2.53 L
TLC % pred: 90 %
TLC: 4.85 L

## 2013-07-17 NOTE — Assessment & Plan Note (Signed)
Most likely this is RA related f/u planned for 11/13/13

## 2013-07-17 NOTE — Progress Notes (Signed)
PFT done today. 

## 2013-07-17 NOTE — Progress Notes (Signed)
Subjective:    Patient ID: Stacey [name], female    DOB: July 13, 1941  MRN: [id number]   Brief patient profile:   8 yowf never smoker with RA since around 2000  Prednisone dep 4 a/w 6 x decades and prev eval by Dr Joya Gaskins around 2004 for sob resolved s maint rx and referred 05/26/2013 by Dr Shelia Media for bronchitis and abn cxr  History of Present Illness  05/26/2013 1st [place] [facility] office visit/ Dontray Haberland cc June 2014 dx pna  In [place] and remicade stopped and 100% better and placed arencia in September 2014  then abruptly worse first week in November with cough green sputum s nasal symptoms, fever low grade and no cp or cough and completely recovered prior to [place] does not recall abx but issue is why keeps getting sick and abn CT Chest (see below).    Arthritis symptoms well controlled at present on Rx for RA rec Nexium 40 mg Take 30-60 min before first meal of the day and add pepcid 20 mg one at bedtime whenever coughing.   07/17/2013 f/u ov/Jairy Angulo re:  RA/ recurrent cough / pred at 4 mg per Linton Flemings plus orencia Chief Complaint  Patient presents with  . Followup with PFT    Pt states she is doing well and denies any co's today.   no longer sign cough.  No obvious day to day or daytime variabilty or assoc sobh or cp or chest tightness, subjective wheeze overt sinus or hb symptoms. No unusual exp hx or h/o childhood pna/ asthma or knowledge of premature birth.  Sleeping ok without nocturnal  or early am exacerbation  of respiratory  c/o's or need for noct saba. Also denies any obvious fluctuation of symptoms with weather or environmental changes or other aggravating or alleviating factors except as outlined above   Current Medications, Allergies, Complete Past Medical History, Past Surgical History, Family History, and Social History were reviewed in [employer] record.  ROS  The following are not active complaints unless bolded sore throat, dysphagia, dental  problems, itching, sneezing,  nasal congestion or excess/ purulent secretions, ear ache,   fever, chills, sweats, unintended wt loss, pleuritic or exertional cp, hemoptysis,  orthopnea pnd or leg swelling, presyncope, palpitations, heartburn, abdominal pain, anorexia, nausea, vomiting, diarrhea  or change in bowel or urinary habits, change in stools or urine, dysuria,hematuria,  rash, arthralgias, visual complaints, headache, numbness weakness or ataxia or problems with walking or coordination,  change in mood/affect or memory.                       Objective:   Physical Exam  07/17/2013          146  Wt Readings from Last 3 Encounters:  05/26/13 147 lb (66.679 kg)  01/13/12 145 lb (65.772 kg)      HEENT: nl dentition, turbinates, and orophanx. Nl external ear canals without cough reflex   NECK :  without JVD/Nodes/TM/ nl carotid upstrokes bilaterally   LUNGS: no acc muscle use, clear to A and P bilaterally without cough on insp or exp maneuvers   CV:  RRR  no s3 or murmur or increase in P2, no edema   ABD:  soft and nontender with nl excursion in the supine position. No bruits or organomegaly, bowel sounds nl  MS:  warm without deformities, calf tenderness, cyanosis or clubbing  SKIN: warm and dry without lesions    NEURO:  alert, approp, no deficits  CT 05/16/13 1. The suspected pulmonary nodule in the right lung apex is actually  a calcified posterior pleural plaque and is benign in appearance.  2. Nodularity in the lateral aspect of the right upper lobe is felt  to be inflammatory in origin. However, there is a 9 mm nodule in  this area.      Assessment & Plan:

## 2013-07-17 NOTE — Patient Instructions (Addendum)
Nexium 40 mg Take 30-60 min before first meal of the day and add pepcid 20 mg one at bedtime whenever coughing.  We have you in our computer to do a follow up CT chest in 11/2013 and will contact you then to arrange but most likely your lung nodules are related to RA  No regular pulmonary follow up is needed at this point

## 2013-07-18 DIAGNOSIS — J984 Other disorders of lung: Secondary | ICD-10-CM | POA: Insufficient documentation

## 2013-07-18 NOTE — Assessment & Plan Note (Addendum)
-   PFT's 07/17/13 VC 74% with no obst and dlco 69 correctrs to 85%  This is minimal and likely not related to RA though I suppose it is possible but even if present the main treatment is to treat the RA, which is already being done per rheum

## 2013-07-18 NOTE — Assessment & Plan Note (Signed)
No evidence of sign asthma clinically or by pfts, no need for any rx

## 2013-07-18 NOTE — Assessment & Plan Note (Signed)
rec rx with max gerd rx when cough active to prevent secordary gerd (from the cough) leading to a cyclical pattern, reviewed

## 2013-07-24 DIAGNOSIS — Z79899 Other long term (current) drug therapy: Secondary | ICD-10-CM | POA: Diagnosis not present

## 2013-07-24 DIAGNOSIS — M949 Disorder of cartilage, unspecified: Secondary | ICD-10-CM | POA: Diagnosis not present

## 2013-07-24 DIAGNOSIS — M069 Rheumatoid arthritis, unspecified: Secondary | ICD-10-CM | POA: Diagnosis not present

## 2013-07-24 DIAGNOSIS — Z5181 Encounter for therapeutic drug level monitoring: Secondary | ICD-10-CM | POA: Diagnosis not present

## 2013-07-24 DIAGNOSIS — M899 Disorder of bone, unspecified: Secondary | ICD-10-CM | POA: Diagnosis not present

## 2013-07-24 DIAGNOSIS — Z23 Encounter for immunization: Secondary | ICD-10-CM | POA: Diagnosis not present

## 2013-07-26 DIAGNOSIS — M069 Rheumatoid arthritis, unspecified: Secondary | ICD-10-CM | POA: Diagnosis not present

## 2013-07-26 DIAGNOSIS — Z79899 Other long term (current) drug therapy: Secondary | ICD-10-CM | POA: Diagnosis not present

## 2013-08-23 DIAGNOSIS — M069 Rheumatoid arthritis, unspecified: Secondary | ICD-10-CM | POA: Diagnosis not present

## 2013-09-20 DIAGNOSIS — M069 Rheumatoid arthritis, unspecified: Secondary | ICD-10-CM | POA: Diagnosis not present

## 2013-10-16 ENCOUNTER — Ambulatory Visit
Admission: RE | Admit: 2013-10-16 | Discharge: 2013-10-16 | Disposition: A | Discharge status: Home / Self Care | Payer: Medicare Other | Source: Ambulatory Visit | Attending: Internal Medicine | Admitting: Internal Medicine

## 2013-10-16 ENCOUNTER — Other Ambulatory Visit: Payer: Self-pay | Admitting: Internal Medicine

## 2013-10-16 DIAGNOSIS — J984 Other disorders of lung: Secondary | ICD-10-CM | POA: Diagnosis not present

## 2013-10-16 DIAGNOSIS — R911 Solitary pulmonary nodule: Secondary | ICD-10-CM

## 2013-10-18 DIAGNOSIS — M069 Rheumatoid arthritis, unspecified: Secondary | ICD-10-CM | POA: Diagnosis not present

## 2013-10-18 DIAGNOSIS — Z79899 Other long term (current) drug therapy: Secondary | ICD-10-CM | POA: Diagnosis not present

## 2013-11-14 ENCOUNTER — Telehealth: Payer: Self-pay | Admitting: *Deleted

## 2013-11-14 NOTE — Telephone Encounter (Signed)
Ct chest already ordered per Dr Shelia Media

## 2013-11-14 NOTE — Telephone Encounter (Signed)
Message copied by Rosana Berger on Tue Nov 14, 2013  1:58 PM ------      Message from: Christinia Gully B      Created: Mon May 29, 2013  8:28 AM       Ct limited no contrast f/u SPN ------

## 2013-11-15 DIAGNOSIS — M069 Rheumatoid arthritis, unspecified: Secondary | ICD-10-CM | POA: Diagnosis not present

## 2013-11-30 DIAGNOSIS — H532 Diplopia: Secondary | ICD-10-CM | POA: Diagnosis not present

## 2013-11-30 DIAGNOSIS — H18599 Other hereditary corneal dystrophies, unspecified eye: Secondary | ICD-10-CM | POA: Diagnosis not present

## 2013-11-30 DIAGNOSIS — H43399 Other vitreous opacities, unspecified eye: Secondary | ICD-10-CM | POA: Diagnosis not present

## 2013-12-13 DIAGNOSIS — M069 Rheumatoid arthritis, unspecified: Secondary | ICD-10-CM | POA: Diagnosis not present

## 2014-01-02 DIAGNOSIS — M069 Rheumatoid arthritis, unspecified: Secondary | ICD-10-CM | POA: Diagnosis not present

## 2014-01-02 DIAGNOSIS — M25579 Pain in unspecified ankle and joints of unspecified foot: Secondary | ICD-10-CM | POA: Diagnosis not present

## 2014-01-02 DIAGNOSIS — M159 Polyosteoarthritis, unspecified: Secondary | ICD-10-CM | POA: Diagnosis not present

## 2014-01-02 DIAGNOSIS — M899 Disorder of bone, unspecified: Secondary | ICD-10-CM | POA: Diagnosis not present

## 2014-01-02 DIAGNOSIS — M949 Disorder of cartilage, unspecified: Secondary | ICD-10-CM | POA: Diagnosis not present

## 2014-01-08 DIAGNOSIS — H2640 Unspecified secondary cataract: Secondary | ICD-10-CM | POA: Diagnosis not present

## 2014-01-08 DIAGNOSIS — Z961 Presence of intraocular lens: Secondary | ICD-10-CM | POA: Diagnosis not present

## 2014-01-10 DIAGNOSIS — M069 Rheumatoid arthritis, unspecified: Secondary | ICD-10-CM | POA: Diagnosis not present

## 2014-01-22 DIAGNOSIS — R269 Unspecified abnormalities of gait and mobility: Secondary | ICD-10-CM | POA: Diagnosis not present

## 2014-01-22 DIAGNOSIS — Z79899 Other long term (current) drug therapy: Secondary | ICD-10-CM | POA: Diagnosis not present

## 2014-01-22 DIAGNOSIS — Z5181 Encounter for therapeutic drug level monitoring: Secondary | ICD-10-CM | POA: Diagnosis not present

## 2014-01-22 DIAGNOSIS — M069 Rheumatoid arthritis, unspecified: Secondary | ICD-10-CM | POA: Diagnosis not present

## 2014-01-24 DIAGNOSIS — H532 Diplopia: Secondary | ICD-10-CM | POA: Diagnosis not present

## 2014-02-05 DIAGNOSIS — H532 Diplopia: Secondary | ICD-10-CM | POA: Diagnosis not present

## 2014-02-07 DIAGNOSIS — M069 Rheumatoid arthritis, unspecified: Secondary | ICD-10-CM | POA: Diagnosis not present

## 2014-02-20 DIAGNOSIS — Z0000 Encounter for general adult medical examination without abnormal findings: Secondary | ICD-10-CM | POA: Diagnosis not present

## 2014-02-20 DIAGNOSIS — I10 Essential (primary) hypertension: Secondary | ICD-10-CM | POA: Diagnosis not present

## 2014-02-20 DIAGNOSIS — E7800 Pure hypercholesterolemia, unspecified: Secondary | ICD-10-CM | POA: Diagnosis not present

## 2014-02-20 DIAGNOSIS — Z7982 Long term (current) use of aspirin: Secondary | ICD-10-CM | POA: Diagnosis not present

## 2014-02-26 DIAGNOSIS — M159 Polyosteoarthritis, unspecified: Secondary | ICD-10-CM | POA: Diagnosis not present

## 2014-02-26 DIAGNOSIS — M899 Disorder of bone, unspecified: Secondary | ICD-10-CM | POA: Diagnosis not present

## 2014-02-26 DIAGNOSIS — M810 Age-related osteoporosis without current pathological fracture: Secondary | ICD-10-CM | POA: Diagnosis not present

## 2014-02-26 DIAGNOSIS — M069 Rheumatoid arthritis, unspecified: Secondary | ICD-10-CM | POA: Diagnosis not present

## 2014-02-26 DIAGNOSIS — I10 Essential (primary) hypertension: Secondary | ICD-10-CM | POA: Diagnosis not present

## 2014-02-26 DIAGNOSIS — I059 Rheumatic mitral valve disease, unspecified: Secondary | ICD-10-CM | POA: Diagnosis not present

## 2014-02-26 DIAGNOSIS — M949 Disorder of cartilage, unspecified: Secondary | ICD-10-CM | POA: Diagnosis not present

## 2014-03-07 DIAGNOSIS — M069 Rheumatoid arthritis, unspecified: Secondary | ICD-10-CM | POA: Diagnosis not present

## 2014-03-27 DIAGNOSIS — R609 Edema, unspecified: Secondary | ICD-10-CM | POA: Diagnosis not present

## 2014-03-27 DIAGNOSIS — I10 Essential (primary) hypertension: Secondary | ICD-10-CM | POA: Diagnosis not present

## 2014-03-29 DIAGNOSIS — L219 Seborrheic dermatitis, unspecified: Secondary | ICD-10-CM | POA: Diagnosis not present

## 2014-03-29 DIAGNOSIS — D239 Other benign neoplasm of skin, unspecified: Secondary | ICD-10-CM | POA: Diagnosis not present

## 2014-03-29 DIAGNOSIS — L821 Other seborrheic keratosis: Secondary | ICD-10-CM | POA: Diagnosis not present

## 2014-03-29 DIAGNOSIS — Z85828 Personal history of other malignant neoplasm of skin: Secondary | ICD-10-CM | POA: Diagnosis not present

## 2014-04-04 DIAGNOSIS — Z79899 Other long term (current) drug therapy: Secondary | ICD-10-CM | POA: Diagnosis not present

## 2014-05-01 DIAGNOSIS — Z23 Encounter for immunization: Secondary | ICD-10-CM | POA: Diagnosis not present

## 2014-05-02 DIAGNOSIS — M05831 Other rheumatoid arthritis with rheumatoid factor of right wrist: Secondary | ICD-10-CM | POA: Diagnosis not present

## 2014-05-02 DIAGNOSIS — M05832 Other rheumatoid arthritis with rheumatoid factor of left wrist: Secondary | ICD-10-CM | POA: Diagnosis not present

## 2014-05-14 ENCOUNTER — Encounter: Payer: Self-pay | Admitting: Internal Medicine

## 2014-05-16 DIAGNOSIS — Z124 Encounter for screening for malignant neoplasm of cervix: Secondary | ICD-10-CM | POA: Diagnosis not present

## 2014-05-17 DIAGNOSIS — Z1231 Encounter for screening mammogram for malignant neoplasm of breast: Secondary | ICD-10-CM | POA: Diagnosis not present

## 2014-05-17 DIAGNOSIS — Z803 Family history of malignant neoplasm of breast: Secondary | ICD-10-CM | POA: Diagnosis not present

## 2014-05-30 DIAGNOSIS — Z79899 Other long term (current) drug therapy: Secondary | ICD-10-CM | POA: Diagnosis not present

## 2014-05-30 DIAGNOSIS — M069 Rheumatoid arthritis, unspecified: Secondary | ICD-10-CM | POA: Diagnosis not present

## 2014-06-26 DIAGNOSIS — M069 Rheumatoid arthritis, unspecified: Secondary | ICD-10-CM | POA: Diagnosis not present

## 2014-06-27 DIAGNOSIS — M0589 Other rheumatoid arthritis with rheumatoid factor of multiple sites: Secondary | ICD-10-CM | POA: Diagnosis not present

## 2014-06-27 DIAGNOSIS — M150 Primary generalized (osteo)arthritis: Secondary | ICD-10-CM | POA: Diagnosis not present

## 2014-06-27 DIAGNOSIS — M8580 Other specified disorders of bone density and structure, unspecified site: Secondary | ICD-10-CM | POA: Diagnosis not present

## 2014-07-24 DIAGNOSIS — M05729 Rheumatoid arthritis with rheumatoid factor of unspecified elbow without organ or systems involvement: Secondary | ICD-10-CM | POA: Diagnosis not present

## 2014-08-10 DIAGNOSIS — M8580 Other specified disorders of bone density and structure, unspecified site: Secondary | ICD-10-CM | POA: Diagnosis not present

## 2014-08-10 DIAGNOSIS — R2681 Unsteadiness on feet: Secondary | ICD-10-CM | POA: Diagnosis not present

## 2014-08-10 DIAGNOSIS — M069 Rheumatoid arthritis, unspecified: Secondary | ICD-10-CM | POA: Diagnosis not present

## 2014-08-10 DIAGNOSIS — Z5181 Encounter for therapeutic drug level monitoring: Secondary | ICD-10-CM | POA: Diagnosis not present

## 2014-08-10 DIAGNOSIS — Z7952 Long term (current) use of systemic steroids: Secondary | ICD-10-CM | POA: Diagnosis not present

## 2014-08-21 DIAGNOSIS — Z79899 Other long term (current) drug therapy: Secondary | ICD-10-CM | POA: Diagnosis not present

## 2014-09-10 DIAGNOSIS — R05 Cough: Secondary | ICD-10-CM | POA: Diagnosis not present

## 2014-09-10 DIAGNOSIS — R509 Fever, unspecified: Secondary | ICD-10-CM | POA: Diagnosis not present

## 2014-09-10 DIAGNOSIS — J069 Acute upper respiratory infection, unspecified: Secondary | ICD-10-CM | POA: Diagnosis not present

## 2014-09-10 DIAGNOSIS — Z7982 Long term (current) use of aspirin: Secondary | ICD-10-CM | POA: Diagnosis not present

## 2014-09-13 DIAGNOSIS — J189 Pneumonia, unspecified organism: Secondary | ICD-10-CM | POA: Diagnosis not present

## 2014-09-21 ENCOUNTER — Ambulatory Visit: Payer: Medicare Other | Admitting: Internal Medicine

## 2014-09-21 DIAGNOSIS — M069 Rheumatoid arthritis, unspecified: Secondary | ICD-10-CM | POA: Diagnosis not present

## 2014-09-26 ENCOUNTER — Ambulatory Visit: Payer: Medicare Other | Admitting: Internal Medicine

## 2014-09-28 ENCOUNTER — Ambulatory Visit (INDEPENDENT_AMBULATORY_CARE_PROVIDER_SITE_OTHER)
Admission: RE | Admit: 2014-09-28 | Discharge: 2014-09-28 | Disposition: A | Discharge status: Home / Self Care | Payer: Medicare Other | Source: Ambulatory Visit | Attending: Internal Medicine | Admitting: Internal Medicine

## 2014-09-28 ENCOUNTER — Ambulatory Visit (INDEPENDENT_AMBULATORY_CARE_PROVIDER_SITE_OTHER): Payer: Medicare Other | Admitting: Internal Medicine

## 2014-09-28 ENCOUNTER — Encounter: Payer: Self-pay | Admitting: Internal Medicine

## 2014-09-28 VITALS — BP 130/80 | HR 74 | Ht 66.0 in | Wt 141.6 lb

## 2014-09-28 DIAGNOSIS — J984 Other disorders of lung: Secondary | ICD-10-CM

## 2014-09-28 DIAGNOSIS — R918 Other nonspecific abnormal finding of lung field: Secondary | ICD-10-CM | POA: Diagnosis not present

## 2014-09-28 DIAGNOSIS — Z23 Encounter for immunization: Secondary | ICD-10-CM

## 2014-09-28 DIAGNOSIS — R05 Cough: Secondary | ICD-10-CM | POA: Diagnosis not present

## 2014-09-28 NOTE — Progress Notes (Signed)
Quick Note:  LMOM with results ______ 

## 2014-09-28 NOTE — Progress Notes (Signed)
Subjective:    Patient ID: Stacey [name], female    DOB: [birth year]  MRN: [id number]   Brief patient profile:   80 yowf never smoker with RA since around 2000  Prednisone dep 4 a/w 6 x decades and prev eval by Dr Joya Gaskins around 2004 for sob resolved s maint rx and referred 05/26/2013 by Dr Shelia Media for bronchitis and abn cxr  History of Present Illness  05/26/2013 1st [facility] office visit/ Gracemarie Skeet cc June 2014 dx pna  In [place] and remicade stopped and 100% better and placed arencia in September 2014  then abruptly worse first week in November with cough green sputum s nasal symptoms, fever low grade and no cp or cough and completely recovered prior to [place] does not recall abx but issue is why keeps getting sick and abn CT Chest (see below).    Arthritis symptoms well controlled at present on Rx for RA rec Nexium 40 mg Take 30-60 min before first meal of the day and add pepcid 20 mg one at bedtime whenever coughing.   07/17/2013 f/u ov/Stanton Kissoon re:  RA/ recurrent cough / pred at 4 mg per Linton Flemings plus orencia Chief Complaint  Patient presents with  . Followup with PFT    Pt states she is doing well and denies any co's today.   no longer sign cough. rec Nexium 40 mg Take 30-60 min before first meal of the day and add pepcid 20 mg one at bedtime whenever coughing.      09/28/2014 f/u ov/Yolanda Huffstetler re: maint rx = Medrol 4 mg daily and orencia/[facility] at [place]  Patient presents with  . Follow-up    Pt states she was dxed with PNA approx 3 wks ago. She states that she was txed with zithromax and breathing is back to baseline.  She still has occ non prod cough.    No obvious day to day or daytime variabilty or assoc sob  or cp or chest tightness, subjective wheeze overt sinus or hb symptoms. No unusual exp hx or h/o childhood pna/ asthma or knowledge of premature birth.  Sleeping ok without nocturnal  or early am exacerbation  of respiratory  c/o's or need for noct saba. Also  denies any obvious fluctuation of symptoms with weather or environmental changes or other aggravating or alleviating factors except as outlined above   Current Medications, Allergies, Complete Past Medical History, Past Surgical History, Family History, and Social History were reviewed in [employer] record.  ROS  The following are not active complaints unless bolded sore throat, dysphagia, dental problems, itching, sneezing,  nasal congestion or excess/ purulent secretions, ear ache,   fever, chills, sweats, unintended wt loss, pleuritic or exertional cp, hemoptysis,  orthopnea pnd or leg swelling, presyncope, palpitations, heartburn, abdominal pain, anorexia, nausea, vomiting, diarrhea  or change in bowel or urinary habits, change in stools or urine, dysuria,hematuria,  rash, arthralgias no change baseline, visual complaints, headache, numbness weakness or ataxia or problems with walking or coordination,  change in mood/affect or memory.                       Objective:   Physical Exam  07/17/2013          146  > 09/28/2014   142  Wt Readings from Last 3 Encounters:  05/26/13 147 lb (66.679 kg)  01/13/12 145 lb (65.772 kg)      HEENT: nl dentition, turbinates, and orophanx. Nl external  ear canals without cough reflex   NECK :  without JVD/Nodes/TM/ nl carotid upstrokes bilaterally   LUNGS: no acc muscle use,  Bilateral mild insp > exp rhonchi    CV:  RRR  no s3 or murmur or increase in P2, no edema   ABD:  soft and nontender with nl excursion in the supine position. No bruits or organomegaly, bowel sounds nl  MS:  warm without deformities, calf tenderness, cyanosis or clubbing  SKIN: warm and dry without lesions    NEURO:  alert, approp, no deficits       CXR PA and Lateral:   09/28/2014 :     I personally reviewed images and agree with radiology impression as follows:     Subpleural reticulation/fibrosis, chronic.  No evidence of acute  cardiopulmonary disease.    Assessment & Plan:

## 2014-09-28 NOTE — Patient Instructions (Addendum)
prevnar 13 one daily   whenever coughing > Nexium 40 mg Take 30-60 min before first meal of the day and add pepcid 20 mg one at bedtime   Please remember to go to the  x-ray department downstairs for your tests - we will call you with the results when they are available.

## 2014-09-29 ENCOUNTER — Encounter: Payer: Self-pay | Admitting: Internal Medicine

## 2014-09-29 NOTE — Assessment & Plan Note (Addendum)
-   PFT's 07/17/13 VC 74% with no obst and dlco 69 correctrs to 85%  I had an extended discussion with the patient reviewing all relevant studies completed to date and  lasting 15 to 20 minutes of a 25 minute visit on the following ongoing concerns:   Baseline cxr makes it difficult to detect subtle superimposed infiltrates and agree with Rx per Dr Shelia Media   We need to see her back here if not maintaining baseline ex tol/ cough control and in meantime reminded re recs for max gerd rx in event of cough due to cyclical nature of GERD/cough/GERD  Also rec Prevnar this year > done

## 2014-09-29 NOTE — Assessment & Plan Note (Signed)
-   See CT 05/16/13  1. The suspected pulmonary nodule in the right lung apex is actually  a calcified posterior pleural plaque and is benign in appearance.  2. Nodularity in the lateral aspect of the right upper lobe is felt  to be inflammatory in origin. However, there is a 9 mm nodule in  this area. - CT 10/16/13 1. Stable chronic lung disease with scattered subpleural scarring and asymmetric right upper lobe subpleural nodularity, likely postinflammatory (prior granulomatous disease). 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 2. No suspicious pulmonary nodule or acute process demonstrated  This is typical of RA lung dz and does not appear to have progressed clinically > rec continue rx of RA per Linton Flemings at [facility]

## 2014-10-16 DIAGNOSIS — Z79899 Other long term (current) drug therapy: Secondary | ICD-10-CM | POA: Diagnosis not present

## 2014-10-29 DIAGNOSIS — D489 Neoplasm of uncertain behavior, unspecified: Secondary | ICD-10-CM | POA: Diagnosis not present

## 2014-10-29 DIAGNOSIS — L659 Nonscarring hair loss, unspecified: Secondary | ICD-10-CM | POA: Diagnosis not present

## 2014-11-12 DIAGNOSIS — M069 Rheumatoid arthritis, unspecified: Secondary | ICD-10-CM | POA: Diagnosis not present

## 2014-12-11 DIAGNOSIS — M069 Rheumatoid arthritis, unspecified: Secondary | ICD-10-CM | POA: Diagnosis not present

## 2014-12-14 DIAGNOSIS — S81802D Unspecified open wound, left lower leg, subsequent encounter: Secondary | ICD-10-CM | POA: Diagnosis not present

## 2015-01-03 DIAGNOSIS — H26491 Other secondary cataract, right eye: Secondary | ICD-10-CM | POA: Diagnosis not present

## 2015-01-03 DIAGNOSIS — H35373 Puckering of macula, bilateral: Secondary | ICD-10-CM | POA: Diagnosis not present

## 2015-01-03 DIAGNOSIS — H532 Diplopia: Secondary | ICD-10-CM | POA: Diagnosis not present

## 2015-01-08 DIAGNOSIS — M069 Rheumatoid arthritis, unspecified: Secondary | ICD-10-CM | POA: Diagnosis not present

## 2015-01-08 DIAGNOSIS — Z79899 Other long term (current) drug therapy: Secondary | ICD-10-CM | POA: Diagnosis not present

## 2015-01-28 DIAGNOSIS — H26491 Other secondary cataract, right eye: Secondary | ICD-10-CM | POA: Diagnosis not present

## 2015-01-28 DIAGNOSIS — Z961 Presence of intraocular lens: Secondary | ICD-10-CM | POA: Diagnosis not present

## 2015-02-15 DIAGNOSIS — M25671 Stiffness of right ankle, not elsewhere classified: Secondary | ICD-10-CM | POA: Diagnosis not present

## 2015-02-15 DIAGNOSIS — M0579 Rheumatoid arthritis with rheumatoid factor of multiple sites without organ or systems involvement: Secondary | ICD-10-CM | POA: Diagnosis not present

## 2015-03-01 ENCOUNTER — Emergency Department (HOSPITAL_COMMUNITY)
Admission: EM | Admit: 2015-03-01 | Discharge: 2015-03-01 | Disposition: A | Discharge status: Home / Self Care | Payer: Medicare Other | Attending: Emergency Medicine | Admitting: Emergency Medicine

## 2015-03-01 ENCOUNTER — Encounter (HOSPITAL_COMMUNITY): Payer: Self-pay | Admitting: Emergency Medicine

## 2015-03-01 DIAGNOSIS — S51802A Unspecified open wound of left forearm, initial encounter: Secondary | ICD-10-CM | POA: Diagnosis not present

## 2015-03-01 DIAGNOSIS — W2209XA Striking against other stationary object, initial encounter: Secondary | ICD-10-CM | POA: Diagnosis not present

## 2015-03-01 DIAGNOSIS — Z8742 Personal history of other diseases of the female genital tract: Secondary | ICD-10-CM | POA: Diagnosis not present

## 2015-03-01 DIAGNOSIS — S51812A Laceration without foreign body of left forearm, initial encounter: Secondary | ICD-10-CM

## 2015-03-01 DIAGNOSIS — Y9389 Activity, other specified: Secondary | ICD-10-CM | POA: Insufficient documentation

## 2015-03-01 DIAGNOSIS — Y998 Other external cause status: Secondary | ICD-10-CM | POA: Insufficient documentation

## 2015-03-01 DIAGNOSIS — Z7952 Long term (current) use of systemic steroids: Secondary | ICD-10-CM | POA: Insufficient documentation

## 2015-03-01 DIAGNOSIS — Z880 Allergy status to penicillin: Secondary | ICD-10-CM | POA: Diagnosis not present

## 2015-03-01 DIAGNOSIS — Z872 Personal history of diseases of the skin and subcutaneous tissue: Secondary | ICD-10-CM | POA: Diagnosis not present

## 2015-03-01 DIAGNOSIS — M810 Age-related osteoporosis without current pathological fracture: Secondary | ICD-10-CM | POA: Diagnosis not present

## 2015-03-01 DIAGNOSIS — J45909 Unspecified asthma, uncomplicated: Secondary | ICD-10-CM | POA: Insufficient documentation

## 2015-03-01 DIAGNOSIS — Z79899 Other long term (current) drug therapy: Secondary | ICD-10-CM | POA: Insufficient documentation

## 2015-03-01 DIAGNOSIS — Z862 Personal history of diseases of the blood and blood-forming organs and certain disorders involving the immune mechanism: Secondary | ICD-10-CM | POA: Diagnosis not present

## 2015-03-01 DIAGNOSIS — M069 Rheumatoid arthritis, unspecified: Secondary | ICD-10-CM | POA: Insufficient documentation

## 2015-03-01 DIAGNOSIS — Y9289 Other specified places as the place of occurrence of the external cause: Secondary | ICD-10-CM | POA: Diagnosis not present

## 2015-03-01 DIAGNOSIS — Z8619 Personal history of other infectious and parasitic diseases: Secondary | ICD-10-CM | POA: Insufficient documentation

## 2015-03-01 DIAGNOSIS — S59912A Unspecified injury of left forearm, initial encounter: Secondary | ICD-10-CM | POA: Diagnosis present

## 2015-03-01 MED ORDER — HYDROCODONE-ACETAMINOPHEN 5-325 MG PO TABS
1.0000 | ORAL_TABLET | Freq: Once | ORAL | Status: AC
  Administered 2015-03-01: 1 via ORAL
  Filled 2015-03-01: qty 1

## 2015-03-01 NOTE — ED Notes (Signed)
PA AKenton Kingfisher at bedside.

## 2015-03-01 NOTE — ED Notes (Signed)
Pt ambulatory to exam room with steady gait.  

## 2015-03-01 NOTE — Discharge Instructions (Signed)
Tissue Adhesive Wound Care °Some cuts, wounds, lacerations, and incisions can be repaired by using tissue adhesive. Tissue adhesive is like glue. It holds the skin together, allowing for faster healing. It forms a strong bond on the skin in about 1 minute and reaches its full strength in about 2 or 3 minutes. The adhesive disappears naturally while the wound is healing. It is important to take proper care of your wound at home while it heals.  °HOME CARE INSTRUCTIONS  °· Showers are allowed. Do not soak the area containing the tissue adhesive. Do not take baths, swim, or use hot tubs. Do not use any soaps or ointments on the wound. Certain ointments can weaken the glue. °· If a bandage (dressing) has been applied, follow your health care provider's instructions for how often to change the dressing.   °· Keep the dressing dry if one has been applied.   °· Do not scratch, pick, or rub the adhesive.   °· Do not place tape over the adhesive. The adhesive could come off when pulling the tape off.   °· Protect the wound from further injury until it is healed.   °· Protect the wound from sun and tanning bed exposure while it is healing and for several weeks after healing.   °· Only take over-the-counter or prescription medicines as directed by your health care provider.   °· Keep all follow-up appointments as directed by your health care provider. °SEEK IMMEDIATE MEDICAL CARE IF:  °· Your wound becomes red, swollen, hot, or tender.   °· You develop a rash after the glue is applied. °· You have increasing pain in the wound.   °· You have a red streak that goes away from the wound.   °· You have pus coming from the wound.   °· You have increased bleeding. °· You have a fever. °· You have shaking chills.   °· You notice a bad smell coming from the wound.   °· Your wound or adhesive breaks open.   °MAKE SURE YOU:  °· Understand these instructions. °· Will watch your condition. °· Will get help right away if you are not doing  well or get worse. °Document Released: 12/23/2000 Document Revised: 04/19/2013 Document Reviewed: 01/18/2013 °ExitCare® Patient Information ©2015 [employer]. This information is not intended to replace advice given to you by your health care provider. Make sure you discuss any questions you have with your health care provider. ° °

## 2015-03-01 NOTE — ED Notes (Signed)
Pt from home c/o hittingher arm on a door knob. Pt reports her skin in very fragile and is torn. Bleeding controlled and small bandage in place. Denies blood thinner use.

## 2015-03-01 NOTE — ED Provider Notes (Signed)
CSN: [id number]     Arrival date & time 03/01/15  2047 History   First MD Initiated Contact with Patient 03/01/15 2104     Chief Complaint  Patient presents with  . skin tear      (Consider location/radiation/quality/duration/timing/severity/associated sxs/prior Treatment) HPI  Stacey [name] 74 year old female presents emergency Department with chief left forearm skin tear. Patient states that she was closing a door today when she hit her arm on a door knob. She got a 6 cm triangular skin avulsion on the left forearm. Bleeding well controlled prior to arrival. She denies any numbness or tingling in the fingertips. She has full strength and grip. She is up-to-date on her tetanus vaccination.  Past Medical History  Diagnosis Date  . Asthma   . Low iron   . Rheumatoid arthritis(714.0)   . H/O varicella   . H/O measles   . Yeast infection   . 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 Lichen sclerosus 54/65/03    Asymptomatic  . Osteoporosis    Past Surgical History  Procedure Laterality Date  . Wisdom tooth extraction    . Cholecystectomy  2011   Family History  Problem Relation Age of Onset  . Asthma Mother   . Anemia Mother   . COPD Father   . Breast cancer Maternal Grandmother 88   Social History  Substance Use Topics  . Smoking status: Never Smoker   . Smokeless tobacco: Never Used  . Alcohol Use: No   OB History    Gravida Para Term Preterm AB TAB SAB Ectopic Multiple Living   2 2 2       2      Review of Systems  Ten systems reviewed and are negative for acute change, except as noted in the HPI.    Allergies  Penicillins and Sulfa antibiotics  Home Medications   Prior to Admission medications   Medication Sig Start Date End Date Taking? Authorizing Provider  Abatacept (ORENCIA) 125 MG/ML SOSY Inject 1 each as directed every 28 (twenty-eight) days.    Yes Historical Provider, MD  amitriptyline (ELAVIL) 25 MG tablet Take 25 mg by mouth at bedtime. 02/04/15  Yes  Historical Provider, MD  cholecalciferol (VITAMIN D) 1000 UNITS tablet Take 1,000 Units by mouth daily.   Yes Historical Provider, MD  esomeprazole (NEXIUM) 40 MG capsule Take 40 mg by mouth daily.    Yes Historical Provider, MD  gabapentin (NEURONTIN) 300 MG capsule Take 600 mg by mouth at bedtime.    Yes Historical Provider, MD  losartan (COZAAR) 25 MG tablet Take 25 mg by mouth daily. 02/04/15  Yes Historical Provider, MD  methylPREDNISolone (MEDROL) 4 MG tablet Take 4 mg by mouth daily.    Yes Historical Provider, MD  venlafaxine XR (EFFEXOR-XR) 75 MG 24 hr capsule Take 75 mg by mouth at bedtime.    Yes Historical Provider, MD   BP 148/66 mmHg  Pulse 74  Temp(Src) 98.1 F (36.7 C) (Oral)  Resp 19  SpO2 93% Physical Exam  Nursing note and vitals reviewed.  Physical Exam  Nursing note and vitals reviewed. Constitutional: She is oriented to person, place, and time. She appears well-developed and well-nourished. No distress.  HENT:  Head: Normocephalic and atraumatic.  Eyes: Conjunctivae normal and EOM are normal. Pupils are equal, round, and reactive to light. No scleral icterus.  Neck: Normal range of motion.  Cardiovascular: Normal rate, regular rhythm and normal heart sounds.  Exam reveals no gallop and no friction  rub.   No murmur heard. Pulmonary/Chest: Effort normal and breath sounds normal. No respiratory distress.  Abdominal: Soft. Bowel sounds are normal. She exhibits no distension and no mass. There is no tenderness. There is no guarding.  Neurological: She is alert and oriented to person, place, and time.  Skin: Skin is warm and dry. She is not diaphoretic.  6 cm triangular avulsion of the left forearm. Bleeding well controlled. Full grip strength. Neurovascularly intact in the left hand.   ED Course  LACERATION REPAIR Date/Time: 03/01/2015 10:15 PM Performed by: Margarita Mail Authorized by: Margarita Mail Consent: Verbal consent obtained. Risks and benefits: risks,  benefits and alternatives were discussed Patient identity confirmed: provided demographic data Time out: Immediately prior to procedure a "time out" was called to verify the correct patient, procedure, equipment, support staff and site/side marked as required. Body area: upper extremity Location details: left lower arm Laceration length: 6 cm Foreign bodies: no foreign bodies Tendon involvement: none Nerve involvement: none Vascular damage: no Anesthesia method: No anesthesia. Irrigation solution: saline Irrigation method: syringe Amount of cleaning: standard Skin closure: glue and Steri-Strips Technique: simple Approximation: close Approximation difficulty: simple Dressing: Nonadherent dressing and [place] a.   (including critical care time) Labs Review Labs Reviewed - No data to display  Imaging Review No results found. I have personally reviewed and evaluated these images and lab results as part of my medical decision-making.   EKG Interpretation None      MDM   Final diagnoses:  Skin tear of forearm without complication, left, initial encounter    Patient with skin tear. Repair with Steri-Strips and adhesive. Home care instructions given. Follow-up with primary care physician. Signed chart    Margarita Mail, PA-C 03/01/15 2217  Blanchie Dessert, MD 03/03/15 [phone]

## 2015-03-13 DIAGNOSIS — M069 Rheumatoid arthritis, unspecified: Secondary | ICD-10-CM | POA: Diagnosis not present

## 2015-03-22 DIAGNOSIS — E559 Vitamin D deficiency, unspecified: Secondary | ICD-10-CM | POA: Diagnosis not present

## 2015-03-22 DIAGNOSIS — Z5189 Encounter for other specified aftercare: Secondary | ICD-10-CM | POA: Diagnosis not present

## 2015-03-22 DIAGNOSIS — E780 Pure hypercholesterolemia: Secondary | ICD-10-CM | POA: Diagnosis not present

## 2015-03-22 DIAGNOSIS — S51819A Laceration without foreign body of unspecified forearm, initial encounter: Secondary | ICD-10-CM | POA: Diagnosis not present

## 2015-03-22 DIAGNOSIS — Z23 Encounter for immunization: Secondary | ICD-10-CM | POA: Diagnosis not present

## 2015-03-22 DIAGNOSIS — Z0000 Encounter for general adult medical examination without abnormal findings: Secondary | ICD-10-CM | POA: Diagnosis not present

## 2015-03-22 DIAGNOSIS — M8580 Other specified disorders of bone density and structure, unspecified site: Secondary | ICD-10-CM | POA: Diagnosis not present

## 2015-03-22 DIAGNOSIS — I10 Essential (primary) hypertension: Secondary | ICD-10-CM | POA: Diagnosis not present

## 2015-04-09 DIAGNOSIS — R05 Cough: Secondary | ICD-10-CM | POA: Diagnosis not present

## 2015-04-09 DIAGNOSIS — I10 Essential (primary) hypertension: Secondary | ICD-10-CM | POA: Diagnosis not present

## 2015-04-09 DIAGNOSIS — N3941 Urge incontinence: Secondary | ICD-10-CM | POA: Diagnosis not present

## 2015-04-10 DIAGNOSIS — Z79899 Other long term (current) drug therapy: Secondary | ICD-10-CM | POA: Diagnosis not present

## 2015-04-10 DIAGNOSIS — M069 Rheumatoid arthritis, unspecified: Secondary | ICD-10-CM | POA: Diagnosis not present

## 2015-04-17 DIAGNOSIS — L821 Other seborrheic keratosis: Secondary | ICD-10-CM | POA: Diagnosis not present

## 2015-04-17 DIAGNOSIS — D225 Melanocytic nevi of trunk: Secondary | ICD-10-CM | POA: Diagnosis not present

## 2015-04-17 DIAGNOSIS — Z85828 Personal history of other malignant neoplasm of skin: Secondary | ICD-10-CM | POA: Diagnosis not present

## 2015-04-17 DIAGNOSIS — L309 Dermatitis, unspecified: Secondary | ICD-10-CM | POA: Diagnosis not present

## 2015-04-18 ENCOUNTER — Encounter: Payer: Self-pay | Admitting: Internal Medicine

## 2015-04-18 ENCOUNTER — Ambulatory Visit (INDEPENDENT_AMBULATORY_CARE_PROVIDER_SITE_OTHER): Payer: Medicare Other | Admitting: Internal Medicine

## 2015-04-18 VITALS — BP 118/70 | HR 75 | Ht 66.0 in | Wt 145.0 lb

## 2015-04-18 DIAGNOSIS — R918 Other nonspecific abnormal finding of lung field: Secondary | ICD-10-CM | POA: Diagnosis not present

## 2015-04-18 DIAGNOSIS — R05 Cough: Secondary | ICD-10-CM

## 2015-04-18 DIAGNOSIS — R059 Cough, unspecified: Secondary | ICD-10-CM

## 2015-04-18 NOTE — Assessment & Plan Note (Addendum)
-   See CT 05/16/13  1. The suspected pulmonary nodule in the right lung apex is actually  a calcified posterior pleural plaque and is benign in appearance.  2. Nodularity in the lateral aspect of the right upper lobe is felt  to be inflammatory in origin. However, there is a 9 mm nodule in  this area.  - PFT's 07/17/13 VC 74% with no obst and dlco 69 correctrs to 85% - CT 10/16/13 1. Stable chronic lung disease with scattered subpleural scarring and asymmetric right upper lobe subpleural nodularity, likely postinflammatory (prior granulomatous disease). 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 2. No suspicious pulmonary nodule or acute process demonstrated.  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 The cough is not typical of ILD as it does not occur reproducibly with insp nor MAI as is mostly dry  I had an extended discussion with the patient reviewing all relevant studies completed to date and  lasting 15 to 20 minutes of a 25 minute visit    Discussed in detail all the  indications, usual  risks and alternatives  relative to the benefits with patient who agrees to proceed with conservative f/u as outlined    Each maintenance medication was reviewed in detail including most importantly the difference between maintenance and prns and under what circumstances the prns are to be triggered using an action plan format that is not reflected in the computer generated alphabetically organized AVS.    Please see instructions for details which were reviewed in writing and the patient given a copy highlighting the part that I personally wrote and discussed at today's ov.

## 2015-04-18 NOTE — Progress Notes (Signed)
Subjective:   Patient ID: Stacey [name], female    DOB: 1941-04-18     MRN: [id number]    Brief patient profile:  68 yowf  never smoker with RA since around 2000  Prednisone  x decades and prev eval by Dr Joya Gaskins around 2004 for sob resolved s maint rx and referred 05/26/2013 by Dr Shelia Media for bronchitis and abn cxr   History of Present Illness  05/26/2013 1st [facility] office visit/ Stacey [name] cc June 2014 dx pna  In [place] and remicade stopped and 100% better and placed arencia in September 2014  then abruptly worse first week in November with cough green sputum s nasal symptoms, fever low grade and no cp or cough and completely recovered prior to [place] does not recall abx but issue is why keeps getting sick and abn CT Chest (see below).   Arthritis symptoms well controlled at present on Rx for RA rec Nexium 40 mg Take 30-60 min before first meal of the day and add pepcid 20 mg one at bedtime whenever coughing.   07/17/2013 f/u ov/Stacey [name] re:  RA/ recurrent cough / pred at 4 mg per Linton Flemings plus orencia Chief Complaint  Patient presents with  . Followup with PFT    Pt states she is doing well and denies any co's today.   no longer sign cough. rec Nexium 40 mg Take 30-60 min before first meal of the day and add pepcid 20 mg one at bedtime whenever coughing.      04/18/2015  f/u ov/Stacey [name] re: mpns / RA / chronic cough  Chief Complaint  Patient presents with  . Follow-up    Pt states Dr Shelia Media wanted her to f/u on MPN.  She c/o occ cough- non prod.   daily cough/ sporadic/ not noct mostly dry   No better on nexium/zantac and no change x one week on zantac/zantac    No obvious day to day or daytime variabilty or assoc sob or cp or chest tightness, subjective wheeze overt sinus or hb symptoms. No unusual exp hx or h/o childhood pna/ asthma or knowledge of premature birth.  Sleeping ok without nocturnal  or early am exacerbation  of respiratory  c/o's or need for noct saba. Also denies  any obvious fluctuation of symptoms with weather or environmental changes or other aggravating or alleviating factors except as outlined above   Current Medications, Allergies, Complete Past Medical History, Past Surgical History, Family History, and Social History were reviewed in [employer] record.  ROS  The following are not active complaints unless bolded sore throat, dysphagia, dental problems, itching, sneezing,  nasal congestion or excess/ purulent secretions, ear ache,   fever, chills, sweats, unintended wt loss, pleuritic or exertional cp, hemoptysis,  orthopnea pnd or leg swelling, presyncope, palpitations, heartburn, abdominal pain, anorexia, nausea, vomiting, diarrhea  or change in bowel or urinary habits, change in stools or urine, dysuria,hematuria,  rash, arthralgias no change baseline= avg control, visual complaints, headache, numbness weakness or ataxia or problems with walking or coordination,  change in mood/affect or memory.                   Objective:   Physical Exam  07/17/2013          146  > 09/28/2014   142 > 04/18/2015  145  Wt Readings from Last 3 Encounters:  05/26/13 147 lb (66.679 kg)  01/13/12 145 lb (65.772 kg)      HEENT: nl dentition,  turbinates, and orophanx. Nl external ear canals without cough reflex   NECK :  without JVD/Nodes/TM/ nl carotid upstrokes bilaterally   LUNGS: no acc muscle use,  Bilateral mild insp pops/sqeaks > exp rhonchi    CV:  RRR  no s3 or murmur or increase in P2, no edema   ABD:  soft and nontender with nl excursion in the supine position. No bruits or organomegaly, bowel sounds nl  MS:  warm without deformities, calf tenderness, cyanosis or clubbing  SKIN: warm and dry without lesions    NEURO:  alert, approp, no deficits        I personally reviewed images and agree with radiology impression as follows:  CXR:  04/09/15  No change mild RN changes bilaterally     Assessment & Plan:

## 2015-04-18 NOTE — Patient Instructions (Addendum)
Neurontin 300 mg with bfast to see if helps the daytime - try this for just a few weeks to see if it helps and if not resume it   GERD (REFLUX)  is an extremely common cause of respiratory symptoms just like yours , many times with no obvious heartburn at all.    It can be treated with medication, but also with lifestyle changes including elevation of the head of your bed (ideally with 6 inch  bed blocks),  Smoking cessation, avoidance of late meals, excessive alcohol, and avoid fatty foods, chocolate, peppermint, colas, red wine, and acidic juices such as orange juice.  NO MINT OR MENTHOL PRODUCTS SO NO COUGH DROPS  USE SUGARLESS CANDY INSTEAD (Jolley ranchers or Stover's or Life Savers) or even ice chips will also do - the key is to swallow to prevent all throat clearing. NO OIL BASED VITAMINS - use powdered substitutes.  We will call in 6 months for follow CT chest and ov.

## 2015-04-18 NOTE — Assessment & Plan Note (Signed)
Most likely this is  Classic Upper airway cough syndrome, so named because it's frequently impossible to sort out how much is  CR/sinusitis with freq throat clearing (which can be related to primary GERD)   vs  causing  secondary (" extra esophageal")  GERD from wide swings in gastric pressure that occur with throat clearing, often  promoting self use of mint and menthol lozenges that reduce the lower esophageal sphincter tone and exacerbate the problem further in a cyclical fashion.   These are the same pts (now being labeled as having "irritable larynx syndrome" by some cough centers) who not infrequently have a history of having failed to tolerate ace inhibitors,  dry powder inhalers or biphosphonates or report having atypical reflux symptoms that don't respond to standard doses of PPI , and are easily confused as having aecopd or asthma flares by even experienced allergists/ pulmonologists.   Will try daytime neurontin plus gerd diet/ok to continue bid h2  - see avs

## 2015-05-08 DIAGNOSIS — M069 Rheumatoid arthritis, unspecified: Secondary | ICD-10-CM | POA: Diagnosis not present

## 2015-05-22 DIAGNOSIS — Z1211 Encounter for screening for malignant neoplasm of colon: Secondary | ICD-10-CM | POA: Diagnosis not present

## 2015-05-22 DIAGNOSIS — N393 Stress incontinence (female) (male): Secondary | ICD-10-CM | POA: Diagnosis not present

## 2015-05-22 DIAGNOSIS — K5900 Constipation, unspecified: Secondary | ICD-10-CM | POA: Diagnosis not present

## 2015-06-05 DIAGNOSIS — M069 Rheumatoid arthritis, unspecified: Secondary | ICD-10-CM | POA: Diagnosis not present

## 2015-06-10 DIAGNOSIS — K5730 Diverticulosis of large intestine without perforation or abscess without bleeding: Secondary | ICD-10-CM | POA: Diagnosis not present

## 2015-06-10 DIAGNOSIS — D126 Benign neoplasm of colon, unspecified: Secondary | ICD-10-CM | POA: Diagnosis not present

## 2015-06-10 DIAGNOSIS — D123 Benign neoplasm of transverse colon: Secondary | ICD-10-CM | POA: Diagnosis not present

## 2015-06-10 DIAGNOSIS — Z1211 Encounter for screening for malignant neoplasm of colon: Secondary | ICD-10-CM | POA: Diagnosis not present

## 2015-06-10 DIAGNOSIS — D122 Benign neoplasm of ascending colon: Secondary | ICD-10-CM | POA: Diagnosis not present

## 2015-06-10 DIAGNOSIS — D125 Benign neoplasm of sigmoid colon: Secondary | ICD-10-CM | POA: Diagnosis not present

## 2015-07-16 DIAGNOSIS — Z79899 Other long term (current) drug therapy: Secondary | ICD-10-CM | POA: Diagnosis not present

## 2015-07-16 DIAGNOSIS — M069 Rheumatoid arthritis, unspecified: Secondary | ICD-10-CM | POA: Diagnosis not present

## 2015-07-31 DIAGNOSIS — R202 Paresthesia of skin: Secondary | ICD-10-CM | POA: Diagnosis not present

## 2015-08-13 DIAGNOSIS — M069 Rheumatoid arthritis, unspecified: Secondary | ICD-10-CM | POA: Diagnosis not present

## 2015-08-20 ENCOUNTER — Encounter: Payer: Self-pay | Admitting: Neurology

## 2015-08-20 ENCOUNTER — Ambulatory Visit (INDEPENDENT_AMBULATORY_CARE_PROVIDER_SITE_OTHER): Payer: Medicare Other | Admitting: Neurology

## 2015-08-20 VITALS — BP 136/72 | HR 76 | Resp 16 | Ht 66.0 in | Wt 144.0 lb

## 2015-08-20 DIAGNOSIS — R200 Anesthesia of skin: Secondary | ICD-10-CM

## 2015-08-20 DIAGNOSIS — I00 Rheumatic fever without heart involvement: Secondary | ICD-10-CM | POA: Diagnosis not present

## 2015-08-20 DIAGNOSIS — M0520 Rheumatoid vasculitis with rheumatoid arthritis of unspecified site: Secondary | ICD-10-CM

## 2015-08-20 DIAGNOSIS — R202 Paresthesia of skin: Principal | ICD-10-CM

## 2015-08-20 NOTE — Patient Instructions (Signed)
We will do a brain and neck scan, called MRI and call you with the test results. We will have to schedule you for this on a separate date. This test requires authorization from your insurance, and we will take care of the insurance process. We will do an EMG and nerve conduction velocity test, which is an electrical nerve and muscle test, which we will schedule. We will call you with the results. We will check blood work today and call you with the test results. Please consider seeing Dr. Melvyn Novas again, discuss also with Dr. Shelia Media.

## 2015-08-20 NOTE — Progress Notes (Signed)
Subjective:    Patient ID: Stacey [name] is a 75 y.o. female.  HPI     Star Age, MD, PhD [facility] [address] [place],  [address]  Dear Dr. Shelia Media,   I saw your patient, Stacey [name], upon your kind request in my neurologic clinic today for initial consultation of her left arm tingling. The patient is unaccompanied today. As you know, [name] is a 75 year old right-handed woman with an underlying medical history of pulmonary nodules and cough, low iron, rheumatoid arthritis, osteoporosis, leg edema, mitral valve prolapse, hyperlipidemia, history of varicella and measles, who reports intermittent left arm numbness and tingling for the past 3 to 4 weeks, non progressive, with fairly abrupt onset. No CP/SOB/diaphoresis/HA/Neck pain. Mild discomfort with it, no actual pain, maybe mild weakness, lasts 2 minutes, no spread, no triggers or alleviating factors, no recent injuries or falls. The tingling seems to involve the entire left arm, starting right at the shoulder joints, does not seem to radiate necessarily from the neck and does not seem to be associated with neck movements or certain arm movements. I reviewed your office note from 07/31/15, which you kindly included. Recent blood work through your office was reviewed from 03/22/2015. This includes a vitamin D level at 36, urinalysis negative, CBC with platelets was negative, CMP unremarkable, lipid profile showed total cholesterol of 240, LDL of 156, both elevated and triglycerides at 128. She drinks caffeine in the form of coffee, one cup every day at 1 soda per day. She does not always drink enough water she admits. She drinks very little alcohol.  Her Past Medical History Is Significant For: Past Medical History  Diagnosis Date  . Asthma   . Low iron   . Rheumatoid arthritis(714.0)   . H/O varicella   . H/O measles   . Yeast infection   . 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 Lichen sclerosus [id number]    Asymptomatic  . Osteoporosis   . Osteoarthritis   . Mitral valve prolapse   . Hypertension   . Post herpetic neuralgia     Her Past Surgical History Is Significant For: Past Surgical History  Procedure Laterality Date  . Wisdom tooth extraction    . Cholecystectomy  2011    Her Family History Is Significant For: Family History  Problem Relation Age of Onset  . Asthma Mother   . Anemia Mother   . COPD Father   . Pulmonary fibrosis Father   . Breast cancer Maternal Grandmother 88    Her Social History Is Significant For: Social History   Social History  . Marital Status: Married    Spouse Name: N/A  . Number of Children: 2  . Years of Education: BA   Occupational History  . Retired    Social History Main Topics  . Smoking status: Never Smoker   . Smokeless tobacco: Never Used  . Alcohol Use: No  . Drug Use: No  . Sexual Activity: Yes   Other Topics Concern  . None   Social History Narrative   Drinks 1 cup of coffee and 1 diet coke a day     Her Allergies Are:  Allergies  Allergen Reactions  . Penicillins     unknown  . Remicade [Infliximab]   . Sulfa Antibiotics     Unknown reaction  :   Her Current Medications Are:  Outpatient Encounter Prescriptions as of 08/20/2015  Medication Sig  . abatacept Maureen Chatters)  250 MG injection Inject into the vein.  Marland Kitchen amitriptyline (ELAVIL) 25 MG tablet Take 25 mg by mouth at bedtime.  . beta carotene w/minerals (OCUVITE) tablet Take 1 tablet by mouth daily.  . cholecalciferol (VITAMIN D) 1000 UNITS tablet Take 1,000 Units by mouth daily.  Marland Kitchen gabapentin (NEURONTIN) 300 MG capsule Take 600 mg by mouth at bedtime.   Marland Kitchen losartan (COZAAR) 25 MG tablet Take 25 mg by mouth daily.  . methylPREDNISolone (MEDROL) 4 MG tablet Take 4 mg by mouth daily.   . ranitidine (ZANTAC) 150 MG tablet Take 1 tablet by mouth 2 (two) times daily.  Marland Kitchen venlafaxine XR (EFFEXOR-XR) 75 MG 24 hr capsule Take 75 mg by  mouth at bedtime.   . [DISCONTINUED] Abatacept (ORENCIA) 125 MG/ML SOSY Inject 1 each as directed every 28 (twenty-eight) days. Reported on 08/20/2015  . [DISCONTINUED] aspirin 81 MG EC tablet Take 81 mg by mouth daily. Swallow whole.   No facility-administered encounter medications on file as of 08/20/2015.  : Review of Systems:  Out of a complete 14 point review of systems, all are reviewed and negative with the exception of these symptoms as listed below:   Review of Systems  Constitutional: Positive for fatigue.  Neurological:       Patient reports developing a feeling of numbness and tingling in L arm starting about a month ago. States that it comes and goes.   Hematological: Bruises/bleeds easily.    Objective:  Neurologic Exam  Physical Exam Physical Examination:   Filed Vitals:   08/20/15 1002  BP: 136/72  Pulse: 76  Resp: 16    General Examination: The patient is a very pleasant 75 y.o. female in no acute distress. She appears well-developed and well-nourished and very well groomed.   HEENT: Normocephalic, atraumatic, pupils are equal, round and reactive to light and accommodation. Funduscopic exam is normal with sharp disc margins noted. She is status post bilateral cataract repairs. Extraocular tracking is good without limitation to gaze excursion or nystagmus noted. Normal smooth pursuit is noted. Hearing is grossly intact. Tympanic membranes are clear bilaterally. Face is symmetric with normal facial animation and normal facial sensation. Speech is clear with no dysarthria noted. There is no hypophonia. There is no lip, neck/head, jaw or voice tremor. Neck is supple with full range of passive and active motion. There are no carotid bruits on auscultation. Oropharynx exam reveals: mild mouth dryness, adequate dental hygiene and mild airway crowding, due to redundant soft palate. Mallampati is class II. Tongue protrudes centrally and palate elevates symmetrically.  Chest:  Clear to auscultation without wheezing, rhonchi or crackles noted on the right but prominent crackles throughout the left lung.  Heart: S1+S2+0, regular and normal without murmurs, rubs or gallops noted.   Abdomen: Soft, non-tender and non-distended with normal bowel sounds appreciated on auscultation.  Extremities: There is no pitting edema in the distal lower extremities bilaterally. Pedal pulses are intact.  Skin: Warm and dry without trophic changes noted. There are no varicose veins. Mild old appearing bruising is noted on the forearms and hands.  Musculoskeletal: exam reveals no obvious joint deformities, tenderness or joint swelling or erythema, with the exception of arthritic changes in both hands and limitation of bilateral wrist extension, right more than left.   Neurologically:  Mental status: The patient is awake, alert and oriented in all 4 spheres. Her immediate and remote memory, attention, language skills and fund of knowledge are appropriate. There is no evidence of aphasia, agnosia, apraxia  or anomia. Speech is clear with normal prosody and enunciation. Thought process is linear. Mood is normal and affect is normal.  Cranial nerves II - XII are as described above under HEENT exam. In addition: shoulder shrug is normal with equal shoulder height noted. Motor exam: Normal bulk, strength and tone is noted. There are no fasciculations and no atrophy noted. There is no drift, tremor or rebound. Romberg shows mild sway. Reflexes are 1-2+ throughout. Babinski: Toes are flexor bilaterally. Fine motor skills and coordination: intact with normal finger taps, normal hand movements, normal rapid alternating patting, normal foot taps and normal foot agility.  Cerebellar testing: No dysmetria or intention tremor on finger to nose testing. Heel to shin is unremarkable bilaterally. There is no truncal or gait ataxia.  Sensory exam: intact to light touch, pinprick, vibration, temperature sense in  the upper and lower extremities, but she has several shorter episodes of tingling of the left arm during the exam without significant pain and no significant weakness is noted.  Gait, station and balance: She stands easily. No veering to one side is noted. No leaning to one side is noted. Posture is age-appropriate and stance is narrow based. Gait shows normal stride length and normal pace. No problems turning are noted. She turns en bloc. Tandem walk is slightly challenging for her.  Assessment and Plan:   In summary, JEANNIA HEINZELMAN is a very pleasant 75 y.o.-year old female with an underlying medical history of pulmonary nodules and cough, low iron, rheumatoid arthritis, osteoporosis, leg edema, mitral valve prolapse, hyperlipidemia, history of varicella and measles, who reports intermittent left arm numbness and tingling for the past 3 to 4 weeks. She has a fairly benign exam. We will check brain MRI, cervical spine MRI both without contrast and upper extremity EMG and nerve conduction testing. Differential diagnosis includes neuropathy, radiculopathy, unlikely something like TIA. Nevertheless, further testing is warranted. I would like to also do some blood work. We will call her with her test results as we get them back. In addition, I will see her back routinely after these tests are done.  I answered all her questions today and the patient was in agreement with the above outlined plan. Thank you very much for allowing me to participate in the care of this nice patient. If I can be of any further assistance to you please do not hesitate to call me at [phone].  Sincerely,   Star Age, MD, PhD

## 2015-08-21 ENCOUNTER — Telehealth: Payer: Self-pay

## 2015-08-21 LAB — HGB A1C W/O EAG: Hgb A1c MFr Bld: 5.9 % — ABNORMAL HIGH (ref 4.8–5.6)

## 2015-08-21 LAB — CK: Total CK: 135 U/L (ref 24–173)

## 2015-08-21 NOTE — Telephone Encounter (Signed)
I spoke to patient and she is aware of results.  

## 2015-08-21 NOTE — Telephone Encounter (Signed)
-----   Message from Star Age, MD sent at 08/21/2015 12:56 PM EST ----- Labs are okay, with normal muscle enzyme, called CK and borderline A1c in the prediabetes range, this does not mean she is frankly diabetic but may be at risk for it. No further action required other than watching diet.  Star Age, MD, PhD [facility])

## 2015-08-21 NOTE — Progress Notes (Signed)
Quick Note:  Labs are okay, with normal muscle enzyme, called CK and borderline A1c in the prediabetes range, this does not mean she is frankly diabetic but may be at risk for it. No further action required other than watching diet.  Star Age, MD, PhD [facility]  ______

## 2015-08-26 DIAGNOSIS — M059 Rheumatoid arthritis with rheumatoid factor, unspecified: Secondary | ICD-10-CM | POA: Diagnosis not present

## 2015-08-26 DIAGNOSIS — M069 Rheumatoid arthritis, unspecified: Secondary | ICD-10-CM | POA: Diagnosis not present

## 2015-08-26 DIAGNOSIS — R2689 Other abnormalities of gait and mobility: Secondary | ICD-10-CM | POA: Diagnosis not present

## 2015-08-27 ENCOUNTER — Ambulatory Visit
Admission: RE | Admit: 2015-08-27 | Discharge: 2015-08-27 | Disposition: A | Discharge status: Home / Self Care | Payer: Medicare Other | Source: Ambulatory Visit | Attending: Neurology | Admitting: Neurology

## 2015-08-27 DIAGNOSIS — R200 Anesthesia of skin: Secondary | ICD-10-CM

## 2015-08-27 DIAGNOSIS — R202 Paresthesia of skin: Principal | ICD-10-CM

## 2015-08-27 DIAGNOSIS — M4802 Spinal stenosis, cervical region: Secondary | ICD-10-CM | POA: Diagnosis not present

## 2015-08-27 DIAGNOSIS — M0520 Rheumatoid vasculitis with rheumatoid arthritis of unspecified site: Secondary | ICD-10-CM

## 2015-08-29 ENCOUNTER — Telehealth: Payer: Self-pay | Admitting: Neurology

## 2015-08-29 DIAGNOSIS — M4802 Spinal stenosis, cervical region: Secondary | ICD-10-CM

## 2015-08-29 DIAGNOSIS — R202 Paresthesia of skin: Secondary | ICD-10-CM

## 2015-08-29 DIAGNOSIS — M9981 Other biomechanical lesions of cervical region: Secondary | ICD-10-CM

## 2015-08-29 NOTE — Telephone Encounter (Signed)
I spoke to the patient and she is aware of results and has appt with [facility] on Monday.

## 2015-08-29 NOTE — Telephone Encounter (Signed)
Referral sent to [place] , Insurance and MRI . Telephone [phone] fax [phone].

## 2015-08-29 NOTE — Telephone Encounter (Signed)
Please call patient regarding her recent scan results. Brain MRI does not show any reason for her left-sided numbness. She does have mild degree of atrophy which is volume loss of the brain. This is not an unexpected finding and not a cause for her left-sided symptoms. On her cervical spine/neck MRI however, there are multilevel degenerative type changes, particularly, narrowing of the nerve root canals on 2 levels, on the left side specifically, which could be causing symptoms of pinched nerve and may very well be the cause of at least contributor to her left arm tingling/pins and needles sensation. It is worthwhile to get an opinion from a spine surgeon, if patient is agreeable, I would like to suggest a referral to neurosurgery for this. Next week's EMG and nerve conduction test scheduled on 09/02/2015 should also provide additional diagnostic help. I have placed an order for a neurosurgical consultation in her chart. Beverlee Nims, please call patient. Hinton Dyer, please process referral.

## 2015-09-02 ENCOUNTER — Encounter (INDEPENDENT_AMBULATORY_CARE_PROVIDER_SITE_OTHER): Payer: Self-pay | Admitting: Diagnostic Neuroimaging

## 2015-09-02 ENCOUNTER — Ambulatory Visit (INDEPENDENT_AMBULATORY_CARE_PROVIDER_SITE_OTHER): Payer: Medicare Other | Admitting: Diagnostic Neuroimaging

## 2015-09-02 DIAGNOSIS — R200 Anesthesia of skin: Secondary | ICD-10-CM

## 2015-09-02 DIAGNOSIS — R202 Paresthesia of skin: Principal | ICD-10-CM

## 2015-09-02 DIAGNOSIS — Z0289 Encounter for other administrative examinations: Secondary | ICD-10-CM

## 2015-09-02 DIAGNOSIS — M0520 Rheumatoid vasculitis with rheumatoid arthritis of unspecified site: Secondary | ICD-10-CM

## 2015-09-02 DIAGNOSIS — M4722 Other spondylosis with radiculopathy, cervical region: Secondary | ICD-10-CM | POA: Diagnosis not present

## 2015-09-02 NOTE — Procedures (Signed)
   [facility]  NCS (NERVE CONDUCTION STUDY) WITH EMG (ELECTROMYOGRAPHY) REPORT   STUDY DATE: 09/02/15 PATIENT NAME: Stacey [name] DOB: [birth year] MRN: [id number]  ORDERING CLINICIAN: Star Age, MD PhD   TECHNOLOGIST: Laretta Alstrom  ELECTROMYOGRAPHER: Earlean Polka. Penumalli, MD  CLINICAL INFORMATION: 75 year old female with left arm numbness.  FINDINGS: NERVE CONDUCTION STUDY: Bilateral median and ulnar motor responses and F wave latencies are normal. Bilateral median and ulnar sensory responses are normal.  NEEDLE ELECTROMYOGRAPHY: Needle examination of left upper extremity deltoid, biceps, triceps, flexor carpi radialis, first dorsal interosseous and left C6-7 paraspinal muscles is normal.   IMPRESSION:  This is a normal study. No electrodiagnostic evidence for neuropathy of cervical radiculopathy at this time.    INTERPRETING PHYSICIAN:  Penni Bombard, MD Certified in Neurology, Neurophysiology and Neuroimaging  [facility] [address] [place], [place] [address] [phone]

## 2015-09-02 NOTE — Progress Notes (Signed)
Quick Note:  Please call and advise the patient that the recent EMG and nerve conduction velocity test, which is the electrical nerve and muscle test we we performed, was reported as within normal limits in the UEs. We checked for abnormal electrical discharges in the muscles or nerves and the report suggested normal findings. No further action is required on this test at this time. Please remind patient to keep any upcoming appointments or tests and to call us with any interim questions, concerns, problems or updates. Thanks,  Star Age, MD, PhD   ______

## 2015-09-03 ENCOUNTER — Telehealth: Payer: Self-pay

## 2015-09-03 NOTE — Telephone Encounter (Signed)
-----   Message from Star Age, MD sent at 09/02/2015  5:14 PM EST ----- Please call and advise the patient that the recent EMG and nerve conduction velocity test, which is the electrical nerve and muscle test we we performed, was reported as within normal limits in the UEs. We checked for abnormal electrical discharges in the muscles or nerves and the report suggested normal findings. No further action is required on this test at this time. Please remind patient to keep any upcoming appointments or tests and to call us with any interim questions, concerns, problems or updates. Thanks,  Star Age, MD, PhD

## 2015-09-03 NOTE — Telephone Encounter (Signed)
I spoke to patient and she is aware of results. Has up coming appt with [place] to discuss further.

## 2015-09-10 ENCOUNTER — Ambulatory Visit (INDEPENDENT_AMBULATORY_CARE_PROVIDER_SITE_OTHER): Payer: Medicare Other | Admitting: Neurology

## 2015-09-10 ENCOUNTER — Encounter: Payer: Self-pay | Admitting: Neurology

## 2015-09-10 VITALS — BP 136/70 | HR 72 | Resp 16 | Ht 66.0 in | Wt 143.0 lb

## 2015-09-10 DIAGNOSIS — R200 Anesthesia of skin: Secondary | ICD-10-CM | POA: Diagnosis not present

## 2015-09-10 DIAGNOSIS — M9981 Other biomechanical lesions of cervical region: Secondary | ICD-10-CM

## 2015-09-10 DIAGNOSIS — M4802 Spinal stenosis, cervical region: Secondary | ICD-10-CM

## 2015-09-10 DIAGNOSIS — R202 Paresthesia of skin: Principal | ICD-10-CM

## 2015-09-10 NOTE — Progress Notes (Signed)
Subjective:    Patient ID: Stacey [name] is a 75 y.o. female.  HPI    Interim history:   Stacey [name] is a 75 year old right-handed woman with an underlying medical history of pulmonary nodules and cough, low iron, rheumatoid arthritis, osteoporosis, leg edema, mitral valve prolapse, hyperlipidemia, history of varicella and measles, who presents for follow-up consultation of her left arm numbness and tingling. The patient is unaccompanied today. I first met her on 08/20/2015 at the request of her primary care physician, at which time she reported a 3-4 week history of intermittent left arm numbness and tingling. Her exam was fairly benign. I suggested a cervical spine and brain MRI, EMG and nerve conduction testing and blood work. Blood work was benign. She had a normal CK level, hemoglobin A1c was in the prediabetes range. We called her with her test results.   She had a brain MRI without contrast on 08/27/2015:  IMPRESSION:  Abnormal MRI scan of the brain showing mild degree of supratentorial cortical atrophy and  diffuse ventriculomegaly which is slightly out of proportion. No acute abnormalities noted.  She had a cervical spine MRI without contrast on 08/28/2015:IMPRESSION:  Abnormal MRI scan of the cervical spine showing prominent multilevel spondylytic changes most prominent at C6-7 where there is a large disc osteophyte complex eccentrically to the left with displacement, flattening of the cord with canal stenosis and moderate left-sided foraminal narrowing. At C5-6 also there is diffuse osteophyte complex causing mild canal and severe left-sided foraminal narrowing. At C4-5 there is prominent left paracentral disc osteophyte protrusion resulting in displacement and indentation of the left hemicord and mild canal stenosis. Overall these changes appear to have progressed slightly compared with previous MRI scan dated 06/08/2009. In addition, personally reviewed the images through the PACS  system.   We called her with her test results. I suggested referring her to a neurosurgeon. She was agreeable. We sent a referral to [place] [facility]. She had an upper extremity EMG and nerve conduction test on 09/02/2015: IMPRESSION: This is a normal study. No electrodiagnostic evidence for neuropathy of cervical radiculopathy at this time.  We called her with her test results.  Today, 09/10/2015: She reports no new symptoms. She saw Dr. Dayton Bailiff on 09/02/2015. I have requested his office notes. They have not been sent as yet. We will review his records when available. She reports intermittent tingling sensations in the left arm. It sounds like they discussed possible epidural steroid injections. She says she can live with the symptoms at this time. We discussed all her test results today.  Previously:  08/20/2015: She reports intermittent left arm numbness and tingling for the past 3 to 4 weeks, non progressive, with fairly abrupt onset. No CP/SOB/diaphoresis/HA/Neck pain. Mild discomfort with it, no actual pain, maybe mild weakness, lasts 2 minutes, no spread, no triggers or alleviating factors, no recent injuries or falls. The tingling seems to involve the entire left arm, starting right at the shoulder joints, does not seem to radiate necessarily from the neck and does not seem to be associated with neck movements or certain arm movements. I reviewed your office note from 07/31/15, which you kindly included. Recent blood work through your office was reviewed from 03/22/2015. This includes a vitamin D level at 36, urinalysis negative, CBC with platelets was negative, CMP unremarkable, lipid profile showed total cholesterol of 240, LDL of 156, both elevated and triglycerides at 128. She drinks caffeine in the form of coffee, one cup every day at  1 soda per day. She does not always drink enough water she admits. She drinks very little alcohol.  Her Past Medical History Is Significant  For: Past Medical History  Diagnosis Date  . Asthma   . Low iron   . Rheumatoid arthritis(714.0)   . H/O varicella   . H/O measles   . Yeast infection   . 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 Lichen sclerosus 50/27/74    Asymptomatic  . Osteoporosis   . Osteoarthritis   . Mitral valve prolapse   . Hypertension   . Post herpetic neuralgia   . Abnormal finding of blood chemistry     Her Past Surgical History Is Significant For: Past Surgical History  Procedure Laterality Date  . Wisdom tooth extraction    . Cholecystectomy  2011    Her Family History Is Significant For: Family History  Problem Relation Age of Onset  . Asthma Mother   . Anemia Mother   . COPD Father   . Pulmonary fibrosis Father   . Breast cancer Maternal Grandmother 88    Her Social History Is Significant For: Social History   Social History  . Marital Status: Married    Spouse Name: N/A  . Number of Children: 2  . Years of Education: BA   Occupational History  . Retired    Social History Main Topics  . Smoking status: Never Smoker   . Smokeless tobacco: Never Used  . Alcohol Use: No  . Drug Use: No  . Sexual Activity: Yes   Other Topics Concern  . None   Social History Narrative   Drinks 1 cup of coffee and 1 diet coke a day     Her Allergies Are:  Allergies  Allergen Reactions  . Penicillins     unknown  . Remicade [Infliximab]   . Sulfa Antibiotics     Unknown reaction  :   Her Current Medications Are:  Outpatient Encounter Prescriptions as of 09/10/2015  Medication Sig  . abatacept (ORENCIA) 250 MG injection Inject into the vein.  Marland Kitchen amitriptyline (ELAVIL) 25 MG tablet Take 25 mg by mouth at bedtime.  . beta carotene w/minerals (OCUVITE) tablet Take 1 tablet by mouth daily.  . cholecalciferol (VITAMIN D) 1000 UNITS tablet Take 1,000 Units by mouth daily.  Marland Kitchen gabapentin (NEURONTIN) 300 MG capsule Take 600 mg by mouth at bedtime.   Marland Kitchen losartan (COZAAR) 25 MG tablet Take 25 mg  by mouth daily.  . methylPREDNISolone (MEDROL) 4 MG tablet Take 4 mg by mouth daily.   . ranitidine (ZANTAC) 150 MG tablet Take 1 tablet by mouth 2 (two) times daily.  Marland Kitchen venlafaxine XR (EFFEXOR-XR) 75 MG 24 hr capsule Take 75 mg by mouth at bedtime.    No facility-administered encounter medications on file as of 09/10/2015.  :  Review of Systems:  Out of a complete 14 point review of systems, all are reviewed and negative with the exception of these symptoms as listed below:   Review of Systems  Neurological:       Patient is most concerned about the tingling in her L arm and her double vision.     Objective:  Neurologic Exam  Physical Exam Physical Examination:   Filed Vitals:   09/10/15 1142  BP: 136/70  Pulse: 72  Resp: 16    General Examination: The patient is a very pleasant 75 y.o. female in no acute distress. She appears well-developed and well-nourished and very well groomed.   HEENT:  Normocephalic, atraumatic, pupils are equal, round and reactive to light and accommodation. Funduscopic exam is normal with sharp disc margins noted. She is status post bilateral cataract repairs. Extraocular tracking is good without limitation to gaze excursion or nystagmus noted. Normal smooth pursuit is noted. Hearing is grossly intact. Face is symmetric with normal facial animation and normal facial sensation. Speech is clear with no dysarthria noted. There is no hypophonia. There is no lip, neck/head, jaw or voice tremor. Neck is supple with full range of passive and active motion. There are no carotid bruits on auscultation. Oropharynx exam reveals: mild mouth dryness, adequate dental hygiene and mild airway crowding, due to redundant soft palate. Mallampati is class II. Tongue protrudes centrally and palate elevates symmetrically.  Chest: Clear to auscultation without wheezing, rhonchi or crackles today.   Heart: S1+S2+0, regular and normal without murmurs, rubs or gallops noted.    Abdomen: Soft, non-tender and non-distended with normal bowel sounds appreciated on auscultation.  Extremities: There is no pitting edema in the distal lower extremities bilaterally. Pedal pulses are intact.  Skin: Warm and dry without trophic changes noted. There are no varicose veins. Mild old appearing bruising is noted on the forearms and hands.   Musculoskeletal: exam reveals no obvious joint deformities, tenderness or joint swelling or erythema, with the exception of arthritic changes in both hands and limitation of bilateral wrist extension, right more than left.   Neurologically:  Mental status: The patient is awake, alert and oriented in all 4 spheres. Her immediate and remote memory, attention, language skills and fund of knowledge are appropriate. There is no evidence of aphasia, agnosia, apraxia or anomia. Speech is clear with normal prosody and enunciation. Thought process is linear. Mood is normal and affect is normal.  Cranial nerves II - XII are as described above under HEENT exam. In addition: shoulder shrug is normal with equal shoulder height noted. Motor exam: Normal bulk, strength and tone is noted. There are no fasciculations and no atrophy noted. There is no drift, tremor or rebound. Romberg shows mild sway. Reflexes are 1-2+ throughout. Fine motor skills and coordination: intact with normal finger taps, normal hand movements, normal rapid alternating patting, normal foot taps and normal foot agility.  Cerebellar testing: No dysmetria or intention tremor on finger to nose testing. Heel to shin is unremarkable bilaterally. There is no truncal or gait ataxia.  Sensory exam: intact and unchanged to light touch, pinprick, vibration, temperature sense in the upper and lower extremities, but she has several shorter episodes of tingling of the left arm during the exam without significant pain and no significant weakness is noted. All stable from last time.  Gait, station and  balance: She stands easily. No veering to one side is noted. No leaning to one side is noted. Posture is age-appropriate and stance is narrow based. Gait shows normal stride length and normal pace. No problems turning are noted. She turns en bloc. Tandem walk is slightly challenging for her, unchanged.  Assessment and Plan:   In summary, Stacey [name] is a very pleasant 75 year old female with an underlying medical history of pulmonary nodules and cough, low iron, rheumatoid arthritis, osteoporosis, leg edema, mitral valve prolapse, hyperlipidemia, history of varicella and measles, who presents for follow-up consultation of her left arm intermittent numbness and tingling she continues to have a benign exam. 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 Based on thes  findings I referred her to neurosurgery. We will await office notes from Dr. Hewitt Shorts evaluation from 09/02/2015. It sounds like patient may have been offered injection treatment but she says that she can live with her symptoms at this time. Furthermore, EMG and nerve conduction testing of her upper extremities was normal which is also reassuring. We talked about all her test results in detail today. At this juncture I suggested an as needed follow-up with me. I answered all her questions today and she was in agreement.  I spent 20 minutes in total face-to-face time with the patient, more than 50% of which was spent in counseling and coordination of care, reviewing test results, reviewing medication and discussing or reviewing the diagnosis of paresthesias, the prognosis and treatment options.

## 2015-09-10 NOTE — Patient Instructions (Signed)
Your exam is stable.  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 Follow instructions per Dr. Christella Noa. I can see you back as needed.

## 2015-09-11 DIAGNOSIS — M069 Rheumatoid arthritis, unspecified: Secondary | ICD-10-CM | POA: Diagnosis not present

## 2015-09-19 DIAGNOSIS — H532 Diplopia: Secondary | ICD-10-CM | POA: Diagnosis not present

## 2015-09-20 ENCOUNTER — Other Ambulatory Visit: Payer: Self-pay | Admitting: Internal Medicine

## 2015-09-20 DIAGNOSIS — R918 Other nonspecific abnormal finding of lung field: Secondary | ICD-10-CM

## 2015-09-20 DIAGNOSIS — Z1231 Encounter for screening mammogram for malignant neoplasm of breast: Secondary | ICD-10-CM | POA: Diagnosis not present

## 2015-10-14 DIAGNOSIS — H532 Diplopia: Secondary | ICD-10-CM | POA: Diagnosis not present

## 2015-10-14 DIAGNOSIS — Z961 Presence of intraocular lens: Secondary | ICD-10-CM | POA: Diagnosis not present

## 2015-10-16 DIAGNOSIS — Z79899 Other long term (current) drug therapy: Secondary | ICD-10-CM | POA: Diagnosis not present

## 2015-10-16 DIAGNOSIS — M069 Rheumatoid arthritis, unspecified: Secondary | ICD-10-CM | POA: Diagnosis not present

## 2015-10-17 ENCOUNTER — Ambulatory Visit (INDEPENDENT_AMBULATORY_CARE_PROVIDER_SITE_OTHER)
Admission: RE | Admit: 2015-10-17 | Discharge: 2015-10-17 | Disposition: A | Discharge status: Home / Self Care | Payer: Medicare Other | Source: Ambulatory Visit | Attending: Internal Medicine | Admitting: Internal Medicine

## 2015-10-17 DIAGNOSIS — R918 Other nonspecific abnormal finding of lung field: Secondary | ICD-10-CM | POA: Diagnosis not present

## 2015-10-21 NOTE — Progress Notes (Signed)
Quick Note:  LMTCB ______ 

## 2015-10-22 NOTE — Progress Notes (Signed)
Quick Note:  LMTCB ______ 

## 2015-10-23 ENCOUNTER — Other Ambulatory Visit: Payer: Self-pay | Admitting: Internal Medicine

## 2015-10-23 DIAGNOSIS — R918 Other nonspecific abnormal finding of lung field: Secondary | ICD-10-CM

## 2015-10-23 NOTE — Progress Notes (Signed)
Quick Note:  Spoke with pt and notified of results per Dr. Wert. Pt verbalized understanding and denied any questions.  ______ 

## 2015-10-24 ENCOUNTER — Ambulatory Visit: Payer: Medicare Other | Admitting: Internal Medicine

## 2015-10-28 ENCOUNTER — Ambulatory Visit: Payer: Medicare Other | Admitting: Internal Medicine

## 2015-11-01 ENCOUNTER — Ambulatory Visit (INDEPENDENT_AMBULATORY_CARE_PROVIDER_SITE_OTHER): Payer: Medicare Other | Admitting: Internal Medicine

## 2015-11-01 ENCOUNTER — Encounter (HOSPITAL_COMMUNITY)
Admission: RE | Admit: 2015-11-01 | Discharge: 2015-11-01 | Disposition: A | Discharge status: Home / Self Care | Payer: Medicare Other | Source: Ambulatory Visit | Attending: Internal Medicine | Admitting: Internal Medicine

## 2015-11-01 ENCOUNTER — Encounter: Payer: Self-pay | Admitting: Internal Medicine

## 2015-11-01 VITALS — BP 114/62 | HR 83 | Ht 66.0 in | Wt 143.0 lb

## 2015-11-01 DIAGNOSIS — J984 Other disorders of lung: Secondary | ICD-10-CM

## 2015-11-01 DIAGNOSIS — R918 Other nonspecific abnormal finding of lung field: Secondary | ICD-10-CM | POA: Diagnosis not present

## 2015-11-01 DIAGNOSIS — R059 Cough, unspecified: Secondary | ICD-10-CM

## 2015-11-01 DIAGNOSIS — R05 Cough: Secondary | ICD-10-CM

## 2015-11-01 LAB — GLUCOSE, CAPILLARY: Glucose-Capillary: 80 mg/dL (ref 65–99)

## 2015-11-01 MED ORDER — FLUDEOXYGLUCOSE F - 18 (FDG) INJECTION
7.1500 | Freq: Once | INTRAVENOUS | Status: AC | PRN
  Administered 2015-11-01: 7.15 via INTRAVENOUS

## 2015-11-01 NOTE — Progress Notes (Signed)
Subjective:   Patient ID: Stacey [name], female    DOB: Nov 11, 1940     MRN: [id number]    Brief patient profile:  60 yowf  never smoker with allergies/inhalers as child outgrew by [employer] then  RA since around 2000  Prednisone  x decades and prev eval by Dr Joya Gaskins around 2004 for sob resolved s maint rx and referred 05/26/2013 by Dr Shelia Media for bronchitis and abn cxr   History of Present Illness  05/26/2013 1st [facility] office visit/ Yonna Alwin cc June 2014 dx pna  In [place] and remicade stopped and 100% better and placed arencia in September 2014  then abruptly worse first week in November with cough green sputum s nasal symptoms, fever low grade and no cp or cough and completely recovered prior to [place] does not recall abx but issue is why keeps getting sick and abn CT Chest (see below).   Arthritis symptoms well controlled at present on Rx for RA rec Nexium 40 mg Take 30-60 min before first meal of the day and add pepcid 20 mg one at bedtime whenever coughing.   07/17/2013 f/u ov/Jvion Turgeon re:  RA/ recurrent cough / pred at 4 mg per Linton Flemings plus orencia Chief Complaint  Patient presents with  . Followup with PFT    Pt states she is doing well and denies any co's today.   no longer sign cough. rec Nexium 40 mg Take 30-60 min before first meal of the day and add pepcid 20 mg one at bedtime whenever coughing.      04/18/2015  f/u ov/Ojani Berenson re: mpns / RA / chronic cough  Chief Complaint  Patient presents with  . Follow-up    Pt states Dr Shelia Media wanted her to f/u on MPN.  She c/o occ cough- non prod.   daily cough/ sporadic/ not noct mostly dry   No better on nexium/zantac and no change x one week on zantac/zantac   rec Neurontin 300 mg with bfast to see if helps the daytime > did not do this but cough resolved anyway attributes to hard rock candy GERD  Diet      11/01/2015  f/u ov/Demitrius Crass re: RA lung dz/ MPNs on zantac 150 bid  Chief Complaint  Patient presents with  .  Follow-up    Cough has improved. She had PET scan this am. No new co's today.   cough resolved with candy/ Not limited by breathing from desired activities      No obvious day to day or daytime variabilty or assoc sob or cp or chest tightness, subjective wheeze overt sinus or hb symptoms. No unusual exp hx or h/o childhood pna/   or knowledge of premature birth.  Sleeping ok without nocturnal  or early am exacerbation  of respiratory  c/o's or need for noct saba. Also denies any obvious fluctuation of symptoms with weather or environmental changes or other aggravating or alleviating factors except as outlined above   Current Medications, Allergies, Complete Past Medical History, Past Surgical History, Family History, and Social History were reviewed in [employer] record.  ROS  The following are not active complaints unless bolded sore throat, dysphagia, dental problems, itching, sneezing,  nasal congestion or excess/ purulent secretions, ear ache,   fever, chills, sweats, unintended wt loss, pleuritic or exertional cp, hemoptysis,  orthopnea pnd or leg swelling, presyncope, palpitations, heartburn, abdominal pain, anorexia, nausea, vomiting, diarrhea  or change in bowel or urinary habits, change in stools or urine,  dysuria,hematuria,  rash, arthralgias no change baseline= avg control, visual complaints, headache, numbness weakness or ataxia or problems with walking or coordination,  change in mood/affect or memory.                   Objective:   Physical Exam  07/17/2013          146  > 09/28/2014   142 > 04/18/2015  145 > 11/01/2015 143  Wt Readings from Last 3 Encounters:  05/26/13 147 lb (66.679 kg)  01/13/12 145 lb (65.772 kg)      HEENT: nl dentition, turbinates, and orophanx. Nl external ear canals without cough reflex   NECK :  without JVD/Nodes/TM/ nl carotid upstrokes bilaterally   LUNGS: no acc muscle use,   Bilateral mild insp pops/sqeaks > min exp  rhonchi    CV:  RRR  no s3 or murmur or increase in P2, no edema   ABD:  soft and nontender with nl excursion in the supine position. No bruits or organomegaly, bowel sounds nl  MS:  warm without deformities, calf tenderness, cyanosis or clubbing  SKIN: warm and dry without lesions    NEURO:  alert, approp, no deficits    I personally reviewed images and agree with radiology impression as follows:  PET 11/01/2015  1. Subpleural nodular density in the left upper lobe shows no abnormal hypermetabolism. Suspected slight increase in size on the comparison CT may be due to thinner slice collimation. Finding is most likely due to scarring. 2. Interstitial lung disease, better evaluated on 10/17/2015.     Assessment & Plan:

## 2015-11-01 NOTE — Assessment & Plan Note (Signed)
-   trial of add on neurontin 300 mg q am to add to 600 hs  > did not do, cough resolved with GERDrx /diet  Hard to tease out cough from ILD from upper airway cough syndrome related to cyclical throat clearing/ gerd  rec continue zantac, no change in gerd diet / f/u prn worsening

## 2015-11-01 NOTE — Assessment & Plan Note (Signed)
-   See CT 05/16/13  1. The suspected pulmonary nodule in the right lung apex is actually  a calcified posterior pleural plaque and is benign in appearance.  2. Nodularity in the lateral aspect of the right upper lobe is felt  to be inflammatory in origin. However, there is a 9 mm nodule in  this area.  - PFT's 07/17/13 VC 74% with no obst and dlco 69 correctrs to 85% - CT 10/16/13 1. Stable chronic lung disease with scattered subpleural scarring and asymmetric right upper lobe subpleural nodularity, likely postinflammatory (prior granulomatous disease). 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 2. No suspicious pulmonary nodule or acute process demonstrated. - CT 10/21/2015 Mild growth of irregular 1.0 cm subpleural left upper lobe pulmonary nodule since 10/16/2013, cannot exclude an indolent primary bronchogenic carcinoma.  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    Please see instructions for details which were reviewed in writing and the patient given a copy highlighting the part that I personally wrote and discussed at today's ov.

## 2015-11-01 NOTE — Patient Instructions (Addendum)
Please schedule a follow up visit in 3 months but call sooner if needed PFTs on return

## 2015-11-01 NOTE — Progress Notes (Signed)
Quick Note:  D/w pt at ov ______ 

## 2015-11-13 DIAGNOSIS — M069 Rheumatoid arthritis, unspecified: Secondary | ICD-10-CM | POA: Diagnosis not present

## 2015-11-18 ENCOUNTER — Ambulatory Visit: Payer: Medicare Other | Admitting: Neurology

## 2015-11-29 DIAGNOSIS — M069 Rheumatoid arthritis, unspecified: Secondary | ICD-10-CM | POA: Diagnosis not present

## 2015-12-11 DIAGNOSIS — M069 Rheumatoid arthritis, unspecified: Secondary | ICD-10-CM | POA: Diagnosis not present

## 2016-01-10 DIAGNOSIS — M069 Rheumatoid arthritis, unspecified: Secondary | ICD-10-CM | POA: Diagnosis not present

## 2016-01-10 DIAGNOSIS — Z79899 Other long term (current) drug therapy: Secondary | ICD-10-CM | POA: Diagnosis not present

## 2016-02-06 DIAGNOSIS — M069 Rheumatoid arthritis, unspecified: Secondary | ICD-10-CM | POA: Diagnosis not present

## 2016-02-07 ENCOUNTER — Encounter: Payer: Self-pay | Admitting: Internal Medicine

## 2016-02-07 ENCOUNTER — Encounter (INDEPENDENT_AMBULATORY_CARE_PROVIDER_SITE_OTHER): Payer: Medicare Other | Admitting: Internal Medicine

## 2016-02-07 ENCOUNTER — Ambulatory Visit (INDEPENDENT_AMBULATORY_CARE_PROVIDER_SITE_OTHER): Payer: Medicare Other | Admitting: Internal Medicine

## 2016-02-07 VITALS — BP 124/62 | HR 77 | Ht 66.0 in | Wt 143.0 lb

## 2016-02-07 DIAGNOSIS — R918 Other nonspecific abnormal finding of lung field: Secondary | ICD-10-CM

## 2016-02-07 DIAGNOSIS — R05 Cough: Secondary | ICD-10-CM | POA: Diagnosis not present

## 2016-02-07 DIAGNOSIS — J984 Other disorders of lung: Secondary | ICD-10-CM | POA: Diagnosis not present

## 2016-02-07 DIAGNOSIS — R059 Cough, unspecified: Secondary | ICD-10-CM

## 2016-02-07 LAB — PULMONARY FUNCTION TEST
DL/VA % pred: 89 %
DL/VA: 4.51 ml/min/mmHg/L
DLCO cor % pred: 68 %
DLCO cor: 18.54 ml/min/mmHg
DLCO unc % pred: 67 %
DLCO unc: 18.25 ml/min/mmHg
FEF 25-75 Post: 1.63 L/sec
FEF 25-75 Pre: 1.33 L/sec
FEF2575-%Change-Post: 22 %
FEF2575-%Pred-Post: 94 %
FEF2575-%Pred-Pre: 77 %
FEV1-%Change-Post: 5 %
FEV1-%Pred-Post: 76 %
FEV1-%Pred-Pre: 72 %
FEV1-Post: 1.73 L
FEV1-Pre: 1.65 L
FEV1FVC-%Change-Post: 1 %
FEV1FVC-%Pred-Pre: 101 %
FEV6-%Change-Post: 3 %
FEV6-%Pred-Post: 77 %
FEV6-%Pred-Pre: 74 %
FEV6-Post: 2.22 L
FEV6-Pre: 2.15 L
FEV6FVC-%Change-Post: 0 %
FEV6FVC-%Pred-Post: 105 %
FEV6FVC-%Pred-Pre: 104 %
FVC-%Change-Post: 3 %
FVC-%Pred-Post: 74 %
FVC-%Pred-Pre: 71 %
FVC-Post: 2.24 L
FVC-Pre: 2.16 L
Post FEV1/FVC ratio: 77 %
Post FEV6/FVC ratio: 100 %
Pre FEV1/FVC ratio: 76 %
Pre FEV6/FVC Ratio: 100 %
RV % pred: 133 %
RV: 3.22 L
TLC % pred: 97 %
TLC: 5.21 L

## 2016-02-07 NOTE — Patient Instructions (Signed)
Return here yearly if your rheumatologist needs lung function tests but as long as you don't have cough or worse exercise tolerance due to breathing you don't need follow up here from pulmonary perspective

## 2016-02-07 NOTE — Progress Notes (Signed)
Subjective:   Patient ID: Stacey [name], female    DOB: 1941/06/23     MRN: [id number]    Brief patient profile:  40 yowf  never smoker with allergies/inhalers as child outgrew by [employer] then  RA since around 2000  Prednisone  x decades and prev eval by Dr Joya Gaskins around 2004 for sob resolved s maint rx and referred 05/26/2013 by Dr Shelia Media for bronchitis and abn cxr   History of Present Illness  05/26/2013 1st [facility] office visit/ Khiley Lieser cc June 2014 dx pna  In [place] and remicade stopped and 100% better and placed arencia in September 2014  then abruptly worse first week in November with cough green sputum s nasal symptoms, fever low grade and no cp or cough and completely recovered prior to [place] does not recall abx but issue is why keeps getting sick and abn CT Chest (see below).   Arthritis symptoms well controlled at present on Rx for RA rec Nexium 40 mg Take 30-60 min before first meal of the day and add pepcid 20 mg one at bedtime whenever coughing.   07/17/2013 f/u ov/Veryl Abril re:  RA/ recurrent cough / pred at 4 mg per Linton Flemings plus orencia Chief Complaint  Patient presents with  . Followup with PFT    Pt states she is doing well and denies any co's today.   no longer sign cough. rec Nexium 40 mg Take 30-60 min before first meal of the day and add pepcid 20 mg one at bedtime whenever coughing.      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 Follow-up    Pt states Dr Shelia Media wanted her to f/u on MPN.  She c/o occ cough- non prod.   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  Follow-up    73mo rov. PFT results. breathing is baseline. no new conerns today.   minimal cough controlled with jolly ranchers / Not limited by breathing from desired activities      No obvious day to day or daytime variabilty or assoc sob or cp or chest tightness, subjective wheeze overt sinus or hb symptoms. No unusual exp hx or h/o childhood pna/   or knowledge of premature birth.  Sleeping ok without nocturnal  or early am exacerbation  of respiratory  c/o's or need for noct saba. Also denies any obvious fluctuation of symptoms with weather or environmental changes or other aggravating or alleviating factors except as outlined above   Current Medications, Allergies, Complete Past Medical History, Past Surgical History, Family History, and Social History were reviewed in [employer] record.  ROS  The following are not active complaints unless bolded sore throat, dysphagia, dental problems, itching, sneezing,  nasal congestion or excess/ purulent secretions, ear ache,   fever, chills, sweats, unintended wt loss, pleuritic or exertional cp, hemoptysis,  orthopnea pnd or leg swelling, presyncope, palpitations, heartburn, abdominal pain, anorexia, nausea, vomiting, diarrhea  or change in bowel or urinary habits, change  in stools or urine, dysuria,hematuria,  rash, arthralgias no change baseline= avg control, visual complaints, headache, numbness weakness or ataxia or problems with walking or coordination,  change in mood/affect or memory.                   Objective:   Physical Exam  07/17/2013          146  > 09/28/2014   142 > 04/18/2015  145 > 11/01/2015 143 > 02/07/2016  143 Wt Readings from Last 3 Encounters:  05/26/13 147 lb (66.679 kg)  01/13/12 145 lb (65.772 kg)      HEENT: nl dentition, turbinates, and orophanx. Nl external ear canals without cough reflex   NECK :  without JVD/Nodes/TM/ nl carotid upstrokes bilaterally   LUNGS: no acc muscle use,    Bilateral minimal  insp pops/sqeaks > no significant exp rhonchi and no cough on insp/exp   CV:  RRR  no s3 or murmur or increase in P2, no edema   ABD:  soft and nontender with nl excursion in the supine position. No bruits or organomegaly, bowel sounds nl  MS:  warm without deformities, calf tenderness, cyanosis or clubbing  SKIN: warm and dry without lesions    NEURO:  alert, approp, no deficits      I personally reviewed images and agree with radiology impression as follows:  PET 11/01/2015  1. Subpleural nodular density in the left upper lobe shows no abnormal hypermetabolism. Suspected slight increase in size on the comparison CT may be due to thinner slice collimation. Finding is most likely due to scarring. 2. Interstitial lung disease, better evaluated on 10/17/2015.     Assessment & Plan:   Outpatient Encounter Prescriptions as of 02/07/2016  Medication Sig  . abatacept (ORENCIA) 250 MG injection Inject into the vein.  Marland Kitchen amitriptyline (ELAVIL) 25 MG tablet Take 25 mg by mouth at bedtime.  . beta carotene w/minerals (OCUVITE) tablet Take 1 tablet by mouth daily.  . cholecalciferol (VITAMIN D) 1000 UNITS tablet Take 1,000 Units by mouth daily.  Marland Kitchen gabapentin (NEURONTIN) 300 MG capsule Take 600 mg by mouth at bedtime.   Marland Kitchen losartan (COZAAR) 25 MG tablet Take 25 mg by mouth daily.  . methylPREDNISolone (MEDROL) 4 MG tablet Take 4 mg by mouth daily.   . ranitidine (ZANTAC) 150 MG tablet Take 1 tablet by mouth 2 (two) times daily.  Marland Kitchen venlafaxine XR (EFFEXOR-XR) 75 MG 24 hr capsule Take 75 mg by mouth at bedtime.    No facility-administered encounter medications on file as of 02/07/2016.

## 2016-02-08 NOTE — Assessment & Plan Note (Addendum)
Not produced on insp so not likely related to RA lung dz >> Controlled with hard rock candies/ gerd diet and zantac > pulmonary f/u is prn

## 2016-02-08 NOTE — Assessment & Plan Note (Signed)
-   See CT 05/16/13  1. The suspected pulmonary nodule in the right lung apex is actually  a calcified posterior pleural plaque and is benign in appearance.  2. Nodularity in the lateral aspect of the right upper lobe is felt  to be inflammatory in origin. However, there is a 9 mm nodule in  this area.  - PFT's 07/17/13 VC 74% with no obst and dlco 69 correctrs to 85% - CT 10/16/13 1. Stable chronic lung disease with scattered subpleural scarring and asymmetric right upper lobe subpleural nodularity, likely postinflammatory (prior granulomatous disease). 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 2. No suspicious pulmonary nodule or acute process demonstrated. - CT 10/21/2015 Mild growth of irregular 1.0 cm subpleural left upper lobe pulmonary nodule since 10/16/2013, cannot exclude an indolent primary bronchogenic carcinoma.  -  PET 11/01/2015 > neg with ild c/w RA -  PFT's  02/07/2016  VC  1.99 (65%)   study with DLCO  67/68 % corrects to 89 % for alv volume   I had an extended final summary discussion with the patient reviewing all relevant studies completed to date and  lasting 15 to 20 minutes of a 25 minute visit on the following issues:    She has classic RA lung dz but no significant symptoms attributed to it and good control of systemic dz with stable lung function so unless needed by rheum ok to f/u in pulmonary /pfts on  A prn basis  Each maintenance medication was reviewed in detail including most importantly the difference between maintenance and as needed and under what circumstances the prns are to be used.  Please see instructions for details which were reviewed in writing and the patient given a copy.

## 2016-02-24 DIAGNOSIS — H10502 Unspecified blepharoconjunctivitis, left eye: Secondary | ICD-10-CM | POA: Diagnosis not present

## 2016-03-05 DIAGNOSIS — M069 Rheumatoid arthritis, unspecified: Secondary | ICD-10-CM | POA: Diagnosis not present

## 2016-03-09 DIAGNOSIS — M810 Age-related osteoporosis without current pathological fracture: Secondary | ICD-10-CM | POA: Diagnosis not present

## 2016-03-09 DIAGNOSIS — Z79899 Other long term (current) drug therapy: Secondary | ICD-10-CM | POA: Diagnosis not present

## 2016-03-09 DIAGNOSIS — M069 Rheumatoid arthritis, unspecified: Secondary | ICD-10-CM | POA: Diagnosis not present

## 2016-03-27 DIAGNOSIS — Z0000 Encounter for general adult medical examination without abnormal findings: Secondary | ICD-10-CM | POA: Diagnosis not present

## 2016-03-27 DIAGNOSIS — E559 Vitamin D deficiency, unspecified: Secondary | ICD-10-CM | POA: Diagnosis not present

## 2016-03-27 DIAGNOSIS — Z23 Encounter for immunization: Secondary | ICD-10-CM | POA: Diagnosis not present

## 2016-03-27 DIAGNOSIS — M810 Age-related osteoporosis without current pathological fracture: Secondary | ICD-10-CM | POA: Diagnosis not present

## 2016-03-27 DIAGNOSIS — E7800 Pure hypercholesterolemia, unspecified: Secondary | ICD-10-CM | POA: Diagnosis not present

## 2016-03-27 DIAGNOSIS — I10 Essential (primary) hypertension: Secondary | ICD-10-CM | POA: Diagnosis not present

## 2016-03-27 DIAGNOSIS — M8580 Other specified disorders of bone density and structure, unspecified site: Secondary | ICD-10-CM | POA: Diagnosis not present

## 2016-04-01 DIAGNOSIS — M859 Disorder of bone density and structure, unspecified: Secondary | ICD-10-CM | POA: Diagnosis not present

## 2016-04-01 DIAGNOSIS — M8580 Other specified disorders of bone density and structure, unspecified site: Secondary | ICD-10-CM | POA: Diagnosis not present

## 2016-04-01 DIAGNOSIS — I341 Nonrheumatic mitral (valve) prolapse: Secondary | ICD-10-CM | POA: Diagnosis not present

## 2016-04-01 DIAGNOSIS — M069 Rheumatoid arthritis, unspecified: Secondary | ICD-10-CM | POA: Diagnosis not present

## 2016-04-01 DIAGNOSIS — M159 Polyosteoarthritis, unspecified: Secondary | ICD-10-CM | POA: Diagnosis not present

## 2016-04-01 DIAGNOSIS — R350 Frequency of micturition: Secondary | ICD-10-CM | POA: Diagnosis not present

## 2016-04-08 DIAGNOSIS — Z79899 Other long term (current) drug therapy: Secondary | ICD-10-CM | POA: Diagnosis not present

## 2016-04-08 DIAGNOSIS — M069 Rheumatoid arthritis, unspecified: Secondary | ICD-10-CM | POA: Diagnosis not present

## 2016-05-12 DIAGNOSIS — Z85828 Personal history of other malignant neoplasm of skin: Secondary | ICD-10-CM | POA: Diagnosis not present

## 2016-05-12 DIAGNOSIS — L821 Other seborrheic keratosis: Secondary | ICD-10-CM | POA: Diagnosis not present

## 2016-05-12 DIAGNOSIS — R350 Frequency of micturition: Secondary | ICD-10-CM | POA: Diagnosis not present

## 2016-05-12 DIAGNOSIS — M545 Low back pain: Secondary | ICD-10-CM | POA: Diagnosis not present

## 2016-05-12 DIAGNOSIS — L03116 Cellulitis of left lower limb: Secondary | ICD-10-CM | POA: Diagnosis not present

## 2016-05-12 DIAGNOSIS — L219 Seborrheic dermatitis, unspecified: Secondary | ICD-10-CM | POA: Diagnosis not present

## 2016-05-12 DIAGNOSIS — D225 Melanocytic nevi of trunk: Secondary | ICD-10-CM | POA: Diagnosis not present

## 2016-05-12 DIAGNOSIS — L304 Erythema intertrigo: Secondary | ICD-10-CM | POA: Diagnosis not present

## 2016-05-12 DIAGNOSIS — Z23 Encounter for immunization: Secondary | ICD-10-CM | POA: Diagnosis not present

## 2016-05-18 DIAGNOSIS — D849 Immunodeficiency, unspecified: Secondary | ICD-10-CM | POA: Diagnosis not present

## 2016-05-18 DIAGNOSIS — I10 Essential (primary) hypertension: Secondary | ICD-10-CM | POA: Diagnosis not present

## 2016-05-18 DIAGNOSIS — L309 Dermatitis, unspecified: Secondary | ICD-10-CM | POA: Diagnosis not present

## 2016-05-18 DIAGNOSIS — L03116 Cellulitis of left lower limb: Secondary | ICD-10-CM | POA: Diagnosis not present

## 2016-06-01 DIAGNOSIS — Z79899 Other long term (current) drug therapy: Secondary | ICD-10-CM | POA: Diagnosis not present

## 2016-06-01 DIAGNOSIS — M069 Rheumatoid arthritis, unspecified: Secondary | ICD-10-CM | POA: Diagnosis not present

## 2016-06-01 DIAGNOSIS — M7989 Other specified soft tissue disorders: Secondary | ICD-10-CM | POA: Diagnosis not present

## 2016-06-08 DIAGNOSIS — L03116 Cellulitis of left lower limb: Secondary | ICD-10-CM | POA: Diagnosis not present

## 2016-06-08 DIAGNOSIS — L304 Erythema intertrigo: Secondary | ICD-10-CM | POA: Diagnosis not present

## 2016-06-09 DIAGNOSIS — M7989 Other specified soft tissue disorders: Secondary | ICD-10-CM | POA: Diagnosis not present

## 2016-06-10 DIAGNOSIS — Z124 Encounter for screening for malignant neoplasm of cervix: Secondary | ICD-10-CM | POA: Diagnosis not present

## 2016-06-10 DIAGNOSIS — Z6823 Body mass index (BMI) 23.0-23.9, adult: Secondary | ICD-10-CM | POA: Diagnosis not present

## 2016-06-10 DIAGNOSIS — M8580 Other specified disorders of bone density and structure, unspecified site: Secondary | ICD-10-CM | POA: Diagnosis not present

## 2016-06-10 DIAGNOSIS — Z01419 Encounter for gynecological examination (general) (routine) without abnormal findings: Secondary | ICD-10-CM | POA: Diagnosis not present

## 2016-06-11 DIAGNOSIS — Z79899 Other long term (current) drug therapy: Secondary | ICD-10-CM | POA: Diagnosis not present

## 2016-06-11 DIAGNOSIS — M069 Rheumatoid arthritis, unspecified: Secondary | ICD-10-CM | POA: Diagnosis not present

## 2016-06-16 DIAGNOSIS — L603 Nail dystrophy: Secondary | ICD-10-CM | POA: Diagnosis not present

## 2016-06-16 DIAGNOSIS — Z85828 Personal history of other malignant neoplasm of skin: Secondary | ICD-10-CM | POA: Diagnosis not present

## 2016-06-16 DIAGNOSIS — L821 Other seborrheic keratosis: Secondary | ICD-10-CM | POA: Diagnosis not present

## 2016-07-15 DIAGNOSIS — M069 Rheumatoid arthritis, unspecified: Secondary | ICD-10-CM | POA: Diagnosis not present

## 2016-08-11 DIAGNOSIS — M069 Rheumatoid arthritis, unspecified: Secondary | ICD-10-CM | POA: Diagnosis not present

## 2016-08-12 DIAGNOSIS — H5021 Vertical strabismus, right eye: Secondary | ICD-10-CM | POA: Diagnosis not present

## 2016-08-21 DIAGNOSIS — R05 Cough: Secondary | ICD-10-CM | POA: Diagnosis not present

## 2016-08-24 DIAGNOSIS — J22 Unspecified acute lower respiratory infection: Secondary | ICD-10-CM | POA: Diagnosis not present

## 2016-08-24 DIAGNOSIS — R05 Cough: Secondary | ICD-10-CM | POA: Diagnosis not present

## 2016-08-28 DIAGNOSIS — R05 Cough: Secondary | ICD-10-CM | POA: Diagnosis not present

## 2016-08-28 DIAGNOSIS — B0229 Other postherpetic nervous system involvement: Secondary | ICD-10-CM | POA: Diagnosis not present

## 2016-08-28 DIAGNOSIS — D72829 Elevated white blood cell count, unspecified: Secondary | ICD-10-CM | POA: Diagnosis not present

## 2016-09-09 DIAGNOSIS — M0680 Other specified rheumatoid arthritis, unspecified site: Secondary | ICD-10-CM | POA: Diagnosis not present

## 2016-09-09 DIAGNOSIS — B0229 Other postherpetic nervous system involvement: Secondary | ICD-10-CM | POA: Diagnosis not present

## 2016-09-09 DIAGNOSIS — M069 Rheumatoid arthritis, unspecified: Secondary | ICD-10-CM | POA: Diagnosis not present

## 2016-09-09 DIAGNOSIS — R911 Solitary pulmonary nodule: Secondary | ICD-10-CM | POA: Diagnosis not present

## 2016-09-09 DIAGNOSIS — J8489 Other specified interstitial pulmonary diseases: Secondary | ICD-10-CM | POA: Diagnosis not present

## 2016-09-09 DIAGNOSIS — Z79899 Other long term (current) drug therapy: Secondary | ICD-10-CM | POA: Diagnosis not present

## 2016-09-18 DIAGNOSIS — Z8709 Personal history of other diseases of the respiratory system: Secondary | ICD-10-CM | POA: Diagnosis not present

## 2016-09-18 DIAGNOSIS — J309 Allergic rhinitis, unspecified: Secondary | ICD-10-CM | POA: Diagnosis not present

## 2016-09-18 DIAGNOSIS — R062 Wheezing: Secondary | ICD-10-CM | POA: Diagnosis not present

## 2016-09-18 DIAGNOSIS — J441 Chronic obstructive pulmonary disease with (acute) exacerbation: Secondary | ICD-10-CM | POA: Diagnosis not present

## 2016-09-22 DIAGNOSIS — D849 Immunodeficiency, unspecified: Secondary | ICD-10-CM | POA: Diagnosis not present

## 2016-09-22 DIAGNOSIS — R05 Cough: Secondary | ICD-10-CM | POA: Diagnosis not present

## 2016-09-22 DIAGNOSIS — J441 Chronic obstructive pulmonary disease with (acute) exacerbation: Secondary | ICD-10-CM | POA: Diagnosis not present

## 2016-09-22 DIAGNOSIS — K219 Gastro-esophageal reflux disease without esophagitis: Secondary | ICD-10-CM | POA: Diagnosis not present

## 2016-09-22 DIAGNOSIS — J309 Allergic rhinitis, unspecified: Secondary | ICD-10-CM | POA: Diagnosis not present

## 2016-09-22 DIAGNOSIS — R062 Wheezing: Secondary | ICD-10-CM | POA: Diagnosis not present

## 2016-09-25 ENCOUNTER — Encounter: Payer: Self-pay | Admitting: Internal Medicine

## 2016-09-25 ENCOUNTER — Ambulatory Visit (INDEPENDENT_AMBULATORY_CARE_PROVIDER_SITE_OTHER): Payer: Medicare Other | Admitting: Internal Medicine

## 2016-09-25 VITALS — BP 120/70 | HR 77 | Ht 66.0 in | Wt 139.2 lb

## 2016-09-25 DIAGNOSIS — J45991 Cough variant asthma: Secondary | ICD-10-CM | POA: Diagnosis not present

## 2016-09-25 DIAGNOSIS — R918 Other nonspecific abnormal finding of lung field: Secondary | ICD-10-CM

## 2016-09-25 MED ORDER — PREDNISONE 10 MG PO TABS: ORAL_TABLET | ORAL | 0 refills | Status: DC

## 2016-09-25 MED ORDER — BUDESONIDE-FORMOTEROL FUMARATE 80-4.5 MCG/ACT IN AERO: 2.0000 | INHALATION_SPRAY | Freq: Two times a day (BID) | RESPIRATORY_TRACT | 0 refills | Status: DC

## 2016-09-25 NOTE — Progress Notes (Signed)
Subjective:   Patient ID: Stacey [name], female    DOB: 04-Nov-1940     MRN: [id number]    Brief patient profile:  72 yowf  never smoker with allergies/inhalers as child outgrew by [employer] then  RA since around 2000  Prednisone  x decades and prev eval by Dr Joya Gaskins around 2004 for sob resolved s maint rx and referred 05/26/2013 by Dr Shelia Media for bronchitis and abn cxr   History of Present Illness  05/26/2013 1st [facility] office visit/ Wert cc June 2014 dx pna  In [place] and remicade stopped and 100% better and placed arencia in September 2014  then abruptly worse first week in November with cough green sputum s nasal symptoms, fever low grade and no cp or cough and completely recovered prior to [place] does not recall abx but issue is why keeps getting sick and abn CT Chest (see below).   Arthritis symptoms well controlled at present on Rx for RA rec Nexium 40 mg Take 30-60 min before first meal of the day and add pepcid 20 mg one at bedtime whenever coughing.   07/17/2013 f/u ov/Wert re:  RA/ recurrent cough / pred at 4 mg per Linton Flemings plus orencia Chief Complaint  Patient presents with  . Followup with PFT    Pt states she is doing well and denies any co's today.   no longer sign cough. rec Nexium 40 mg Take 30-60 min before first meal of the day and add pepcid 20 mg one at bedtime whenever coughing.      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 Follow-up    Pt states Dr Shelia Media wanted her to f/u on MPN.  She c/o occ cough- non prod.   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  Follow-up    8mo rov. PFT results. breathing is baseline. no new conerns today.   minimal cough controlled with jolly ranchers / Not limited by breathing from desired activities   rec f/u prn    09/25/2016 acute extended ov/Wert referred back by Dr Shelia Media re:  RA/ MPNS on zantac 150 am/ hs on maint medrol  Chief Complaint  Patient presents with  . Acute Visit    cough    acutely started coughing mid Feb 2018  eval by PA Dx copd rx abx/prednisone taper then back on medrol > much better  Same symptoms 09/11/16 assoc slt running nose> watery  assoc sense of wheezing 24/7  eval by NP  Dx copd / allergy/ asthma > rx zyrtec and BREO and neb > no better   Seen 09/23/15 with cxr  With minimal acute on chronic changes diffusely esp LUL  > no change rx but stopped BREO   Now cough better off BREO / still bothers intermittently during the day but does not disturb sleep and denies  excess/ purulent sputum or mucus plugs        No obvious day to day or daytime variabilty or assoc sob  or cp or chest tightness, subjective wheeze overt sinus or hb symptoms. No unusual exp hx or h/o childhood pna/   or knowledge of premature birth.  Sleeping ok without nocturnal  or early am exacerbation  of respiratory  c/o's or need for noct saba. Also denies any obvious fluctuation of symptoms with weather or environmental changes or other aggravating or alleviating factors except as outlined above   Current Medications, Allergies, Complete Past Medical History, Past Surgical History, Family History, and Social History were reviewed in [employer] record.  ROS  The following are not active complaints unless bolded sore throat, dysphagia, dental problems, itching, sneezing,  nasal congestion or excess/ purulent secretions, ear ache,   fever, chills, sweats, unintended wt loss, pleuritic or exertional cp, hemoptysis,  orthopnea pnd or leg swelling, presyncope, palpitations, heartburn, abdominal pain,  anorexia, nausea, vomiting, diarrhea  or change in bowel or urinary habits, change in stools or urine, dysuria,hematuria,  rash, arthralgias no change baseline= avg control, visual complaints, headache, numbness weakness or ataxia or problems with walking or coordination,  change in mood/affect or memory.                   Objective:   Physical Exam  amb pleasant wf with minimal rattling cough on FVC maneuver  Vita signs reviewed:  - Note on arrival 02 sats  96% on RA    07/17/2013          146  > 09/28/2014   142 > 04/18/2015  145 > 11/01/2015 143 > 02/07/2016  143 > 09/25/2016  139     05/26/13 147 lb (66.679 kg)  01/13/12 145 lb (65.772 kg)      HEENT: nl dentition, turbinates, and orophanx. Nl external ear canals without cough reflex   NECK :  without JVD/Nodes/TM/ nl carotid upstrokes bilaterally   LUNGS: no acc muscle use,  Minimal insp and exp rhonchi bilaterally with no cough on insp or exp maneuvers/ a few pops and squeaks in both bases as well on insp    CV:  RRR  no s3 or murmur or increase in P2, no edema   ABD:  soft and nontender with nl excursion in the supine position. No bruits or organomegaly, bowel sounds nl  MS:  warm without deformities, calf tenderness, cyanosis or clubbing  SKIN: warm and dry without lesions    NEURO:  alert, approp, no deficits       I personally reviewed images and agree with radiology impression as follows:  CXR:   09/22/16 "COPD with chronic fibrotic changes ? New LUL density" and ndularity RUl no change since 04/09/15"     Assessment & Plan:

## 2016-09-25 NOTE — Patient Instructions (Signed)
Try prilosec otc 40mg   Take 30-60 min before first meal of the day and zantac 150 mg after supper and  one @  bedtime until cough is completely gone for at least a week without the need for cough suppression  For cough mucinex dm 1200 mg every 12 hours as needed   Prednisone 10 mg take  4 each am x 2 days,   2 each am x 2 days,  1 each am x 2 days and stop   Start symbicort 80 Take 2 puffs first thing in am and then another 2 puffs about 12 hours later and stop after 2 weeks and see if cough flares and if so we will call it in  Work on inhaler technique:  relax and gently blow all the way out then take a nice smooth deep breath back in, triggering the inhaler at same time you start breathing in.  Hold for up to 5 seconds if you can. Blow out thru nose. Rinse and gargle with water when done      Please schedule a follow up office visit in 4 weeks, sooner if needed with cxr on return

## 2016-09-26 NOTE — Assessment & Plan Note (Signed)
-   See CT 05/16/13  1. The suspected pulmonary nodule in the right lung apex is actually  a calcified posterior pleural plaque and is benign in appearance.  2. Nodularity in the lateral aspect of the right upper lobe is felt  to be inflammatory in origin. However, there is a 9 mm nodule in  this area.  - PFT's 07/17/13 VC 74% with no obst and dlco 69 correctrs to 85% - CT 10/16/13 1. Stable chronic lung disease with scattered subpleural scarring and asymmetric right upper lobe subpleural nodularity, likely postinflammatory (prior granulomatous disease). 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 2. No suspicious pulmonary nodule or acute process demonstrated. - CT 10/21/2015 Mild growth of irregular 1.0 cm subpleural left upper lobe pulmonary nodule since 10/16/2013, cannot exclude an indolent primary bronchogenic carcinoma.  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   Will do f/u cxr to make sure there are no evolving changes but no need for further abx or pulmonary w/u at this point

## 2016-09-26 NOTE — Assessment & Plan Note (Addendum)
-   04/18/15 rec max rx for gerd > resolve  - recurred Feb 2018 > max gerd rx added 09/25/2016  - 09/25/2016  After extensive coaching HFA effectiveness =    75% so try symbicort 80 2bid x one sample only   This is clearly not "copd" though she keeps getting labeled that way.  She is convinced the short term prednisone helped her cough so it may be she has components of both uacs and cough variant asthma here but hopefully not resp bronchiolitis from RA; however, all three are in the ddx so rec trial of symb 80 2 bid x 2 weeks then try off and max rx directed to gerd/ cyclical coughing only    I had an extended discussion with the patient reviewing all relevant studies completed to date and  lasting 25 minutes of a 40  minute acute office visit for purpose of re-establishing re     non-specific but potentially very serious refractory respiratory symptoms of unknown etiology.  Each maintenance medication was reviewed in detail including most importantly the difference between maintenance and prns and under what circumstances the prns are to be triggered using an action plan format that is not reflected in the computer generated alphabetically organized AVS.    Please see AVS for specific instructions unique to this office visit that I personally wrote and verbalized to the the pt in detail and then reviewed with pt  by my nurse highlighting any changes in therapy/plan of care  recommended at today's visit.

## 2016-10-15 DIAGNOSIS — K219 Gastro-esophageal reflux disease without esophagitis: Secondary | ICD-10-CM | POA: Diagnosis not present

## 2016-10-15 DIAGNOSIS — J4521 Mild intermittent asthma with (acute) exacerbation: Secondary | ICD-10-CM | POA: Diagnosis not present

## 2016-10-19 ENCOUNTER — Other Ambulatory Visit: Payer: Self-pay | Admitting: Internal Medicine

## 2016-10-19 MED ORDER — BUDESONIDE-FORMOTEROL FUMARATE 80-4.5 MCG/ACT IN AERO: 2.0000 | INHALATION_SPRAY | Freq: Two times a day (BID) | RESPIRATORY_TRACT | 11 refills | Status: DC

## 2016-10-22 ENCOUNTER — Ambulatory Visit (INDEPENDENT_AMBULATORY_CARE_PROVIDER_SITE_OTHER): Payer: Medicare Other | Admitting: Internal Medicine

## 2016-10-22 ENCOUNTER — Ambulatory Visit (INDEPENDENT_AMBULATORY_CARE_PROVIDER_SITE_OTHER)
Admission: RE | Admit: 2016-10-22 | Discharge: 2016-10-22 | Disposition: A | Discharge status: Home / Self Care | Payer: Medicare Other | Source: Ambulatory Visit | Attending: Internal Medicine | Admitting: Internal Medicine

## 2016-10-22 ENCOUNTER — Encounter: Payer: Self-pay | Admitting: Internal Medicine

## 2016-10-22 VITALS — BP 126/74 | HR 80 | Ht 66.0 in | Wt 140.0 lb

## 2016-10-22 DIAGNOSIS — J45991 Cough variant asthma: Secondary | ICD-10-CM | POA: Diagnosis not present

## 2016-10-22 DIAGNOSIS — R918 Other nonspecific abnormal finding of lung field: Secondary | ICD-10-CM | POA: Diagnosis not present

## 2016-10-22 DIAGNOSIS — R05 Cough: Secondary | ICD-10-CM | POA: Diagnosis not present

## 2016-10-22 MED ORDER — PANTOPRAZOLE SODIUM 40 MG PO TBEC: 40.0000 mg | DELAYED_RELEASE_TABLET | Freq: Every day | ORAL | 2 refills | Status: DC

## 2016-10-22 MED ORDER — METHYLPREDNISOLONE 4 MG PO TABS: ORAL_TABLET | ORAL | Status: DC

## 2016-10-22 MED ORDER — FLUTTER DEVI: 0 refills | Status: DC

## 2016-10-22 MED ORDER — RANITIDINE HCL 150 MG PO TABS: ORAL_TABLET | ORAL | 11 refills | Status: DC

## 2016-10-22 MED ORDER — GABAPENTIN 300 MG PO CAPS: ORAL_CAPSULE | ORAL | 11 refills | Status: DC

## 2016-10-22 NOTE — Progress Notes (Signed)
Subjective:   Patient ID: Stacey [name], female    DOB: [birth year]     MRN: [id number]    Brief patient profile:  5 yowf  never smoker with allergies/inhalers as child outgrew by [employer] then  RA since around 2000  Prednisone  x decades and prev eval by Dr Joya Gaskins around 2004 for sob resolved s maint rx and referred 05/26/2013 by Dr Shelia Media for bronchitis and abn cxr   History of Present Illness  05/26/2013 1st [facility] office visit/ Wert cc June 2014 dx pna  In [place] and remicade stopped and 100% better and placed arencia in September 2014  then abruptly worse first week in November with cough green sputum s nasal symptoms, fever low grade and no cp or cough and completely recovered prior to [place] does not recall abx but issue is why keeps getting sick and abn CT Chest (see below).   Arthritis symptoms well controlled at present on Rx for RA rec Nexium 40 mg Take 30-60 min before first meal of the day and add pepcid 20 mg one at bedtime whenever coughing.   07/17/2013 f/u ov/Wert re:  RA/ recurrent cough / pred at 4 mg per Linton Flemings plus orencia Chief Complaint  Patient presents with  . Followup with PFT    Pt states she is doing well and denies any co's today.   no longer sign cough. rec Nexium 40 mg Take 30-60 min before first meal of the day and add pepcid 20 mg one at bedtime whenever coughing.      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 Follow-up    Pt states Dr Shelia Media wanted her to f/u on MPN.  She c/o occ cough- non prod.   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  Follow-up    10mo rov. PFT results. breathing is baseline. no new conerns today.   minimal cough controlled with jolly ranchers / Not limited by breathing from desired activities   rec f/u prn    09/25/2016 acute extended ov/Wert referred back by Dr Shelia Media re:  RA/ MPNS on zantac 150 am/ hs on maint medrol  Chief Complaint  Patient presents with  . Acute Visit    cough    acutely started coughing mid Feb 2018  eval by PA Dx copd rx abx/prednisone taper then back on medrol > much better  Same symptoms 09/11/16 assoc slt running nose> watery  assoc sense of wheezing 24/7  eval by NP  Dx copd / allergy/ asthma > rx zyrtec and BREO and neb > no better  Seen 09/23/15 with cxr  With minimal acute on chronic changes diffusely esp LUL  > no change rx but stopped BREO  Now cough better off BREO / still bothers intermittently during the day but does not disturb sleep and denies  excess/ purulent sputum or mucus plugs   rec Try prilosec otc 40mg   Take 30-60 min before first meal of the day and zantac 150  mg after supper and  one @  bedtime until cough is completely gone for at least a week without the need for cough suppression For cough mucinex dm 1200 mg every 12 hours as needed  Prednisone 10 mg take  4 each am x 2 days,   2 each am x 2 days,  1 each am x 2 days and stop  Start symbicort 80 Take 2 puffs first thing in am and then another 2 puffs about 12 hours later and stop after 2 weeks  Please schedule a follow up office visit in 4 weeks, sooner if needed with cxr on return     10/22/2016  f/u ov/Wert re:  RA/ MPNs/ Medrol 4mg  daily  Chief Complaint  Patient presents with  . Follow-up    cough unchanged, cough mostly nonprod cough with clear mucus.    refilled symbicort though did not seem to work so continued it until saw Dr Shelia Media and he replaced it with albuterol and seemed better p that in terms of cough which continues mostly dry and day > noct/ better p short course prednisone then flared  again ? While still on symbicort  Not limited by breathing from desired activities  But very sedentary  No obvious day to day or daytime variability or assoc excess/ purulent sputum or mucus plugs or hemoptysis or cp or chest tightness, subjective wheeze or overt sinus or hb symptoms. No unusual exp hx or h/o childhood pna/ asthma or knowledge of premature birth.  Sleeping ok without nocturnal  or early am exacerbation  of respiratory  c/o's or need for noct saba. Also denies any obvious fluctuation of symptoms with weather or environmental changes or other aggravating or alleviating factors except as outlined above   Current Medications, Allergies, Complete Past Medical History, Past Surgical History, Family History, and Social History were reviewed in [employer] record.  ROS  The following are not active complaints unless bolded sore throat, dysphagia, dental problems, itching, sneezing,  nasal congestion or excess/ purulent secretions, ear ache,   fever, chills, sweats, unintended wt loss, classically pleuritic or exertional cp,  orthopnea pnd or leg swelling, presyncope, palpitations, abdominal pain, anorexia, nausea, vomiting, diarrhea  or change in bowel or bladder habits, change in stools or urine, dysuria,hematuria,  rash, arthralgias, visual complaints, headache, numbness, weakness or ataxia or problems with walking or coordination,  change in mood/affect or memory.                          Objective:   Physical Exam  amb pleasant chronically ill appearing wf    Vita signs reviewed:  - Note on arrival 02 sats  97% on RA    07/17/2013          146  > 09/28/2014   142 > 04/18/2015  145 > 11/01/2015 143 > 02/07/2016  143 > 09/25/2016  139 > 10/22/2016  140    05/26/13 147 lb (66.679 kg)  01/13/12 145 lb (65.772 kg)      HEENT: nl dentition, turbinates, and orophanx. Nl external ear canals without cough reflex   NECK :  without JVD/Nodes/TM/ nl carotid  upstrokes bilaterally   LUNGS: no acc muscle use,  insp and exp rhonchi bilaterally with variable coughing on insp and exp    CV:  RRR  no s3 or murmur or increase in P2, no edema   ABD:  soft and nontender with nl excursion in  the supine position. No bruits or organomegaly, bowel sounds nl  MS:  warm without deformities, calf tenderness, cyanosis or clubbing  SKIN: warm and dry without lesions    NEURO:  alert, approp, no deficits       CXR PA and Lateral:   10/22/2016 :    I personally reviewed images and agree with radiology impression as follows:   No real change in bilateral upper lobe coarse change L > R appear new since 08/24/16      Assessment & Plan:

## 2016-10-22 NOTE — Patient Instructions (Addendum)
Stop the prilosec and change to pantoprazole (protonix) 40 mg   Take  30-60 min before first meal of the day and  Zantac 300  mg one @  bedtime until return to office - this is the best way to tell whether stomach acid is contributing to your problem.    GERD (REFLUX)  is an extremely common cause of respiratory symptoms just like yours , many times with no obvious heartburn at all.    It can be treated with medication, but also with lifestyle changes including elevation of the head of your bed (ideally with 6 inch  bed blocks),  Smoking cessation, avoidance of late meals, excessive alcohol, and avoid fatty foods, chocolate, peppermint, colas, red wine, and acidic juices such as orange juice.  NO MINT OR MENTHOL PRODUCTS SO NO COUGH DROPS   USE SUGARLESS CANDY INSTEAD (Jolley ranchers or Stover's or Life Savers) or even ice chips will also do - the key is to swallow to prevent all throat clearing. NO OIL BASED VITAMINS - use powdered substitutes.    If coughing > use the  flutter valve as much as you can   Change gabapentin to 300 mg in am and 600 mg in pm   Double the medrol to take 2 each am   Ok to use to be nebulizer up to 4 hours if needed   See Tammy NP w/in 2 weeks with all your medications, even over the counter meds, separated in two separate bags, the ones you take no matter what vs the ones you stop once you feel better and take only as needed when you feel you need them.   Tammy  will generate for you a new user friendly medication calendar that will put [place] all on the same page re: your medication use.     Without this process, it simply isn't possible to assure that we are providing  your outpatient care  with  the attention to detail we feel you deserve.   If we cannot assure that you're getting that kind of care,  then we cannot manage your problem effectively from this clinic.  Once you have seen Tammy and we are sure that we're all on the same page with your medication use she  will arrange follow up with me.    Please remember to go to the  x-ray department downstairs in the basement  for your tests - we will call you with the results when they are available.

## 2016-10-23 ENCOUNTER — Telehealth: Payer: Self-pay | Admitting: Internal Medicine

## 2016-10-23 DIAGNOSIS — J181 Lobar pneumonia, unspecified organism: Principal | ICD-10-CM

## 2016-10-23 DIAGNOSIS — J189 Pneumonia, unspecified organism: Secondary | ICD-10-CM

## 2016-10-23 MED ORDER — LEVOFLOXACIN 500 MG PO TABS: 500.0000 mg | ORAL_TABLET | Freq: Every day | ORAL | 0 refills | Status: DC

## 2016-10-23 NOTE — Telephone Encounter (Signed)
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  She will take the abx and have the HRCT done.  Nothing further is needed.

## 2016-10-23 NOTE — Progress Notes (Signed)
lmtcb at home and on cell

## 2016-10-23 NOTE — Assessment & Plan Note (Addendum)
-   04/18/15 rec max rx for gerd > resolve - recurred Feb 2018 > max gerd rx added 09/25/2016  - 09/25/2016  After extensive coaching HFA effectiveness =    75% so try symbicort 80 2bid x one sample only   - improved on higher doses of pred and saba by report 10/22/2016 so rec medrol 4 mg double dose until better then resume previous dose   - flutter valve teaching done 10/22/2016   Much of her coughing is typical of uacs but given RA also concerned about RA related bronchiolitis here and so will treat for both with max rx for gerd/ add flutter valve and regroup in 2 weeks  with all meds in hand using a trust but verify approach to confirm accurate Medication  Reconciliation The principal here is that until we are certain that the  patients are doing what we've asked, it makes no sense to ask them to do more.

## 2016-10-23 NOTE — Assessment & Plan Note (Signed)
-   See CT 05/16/13  1. The suspected pulmonary nodule in the right lung apex is actually  a calcified posterior pleural plaque and is benign in appearance.  2. Nodularity in the lateral aspect of the right upper lobe is felt  to be inflammatory in origin. However, there is a 9 mm nodule in  this area.  - PFT's 07/17/13 VC 74% with no obst and dlco 69 correctrs to 85% - CT 10/16/13 1. Stable chronic lung disease with scattered subpleural scarring and asymmetric right upper lobe subpleural nodularity, likely postinflammatory (prior granulomatous disease). 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 2. No suspicious pulmonary nodule or acute process demonstrated. - CT 10/21/2015 Mild growth of irregular 1.0 cm subpleural left upper lobe pulmonary nodule since 10/16/2013, cannot exclude an indolent primary bronchogenic carcinoma.  -  PET 11/01/2015 > neg with ild c/w RA - PFT's  02/07/2016  VC  1.99 (65%)   study with DLCO  67/68 % corrects to 89 % for alv volume    - HRCT rec p 10 days of levaquin  ddx = Miscellaneous:Alv microlithiasis, alv proteinosis, asp, bronchiectais, BOOP   ARDS/ AIP Occupational dz/ HSP Neoplasm Infection esp MAI  Drug  Pulmonary emboli, Protein disorders Edema/Eosinophilic dz Sarcoidosis Connective tissue dz in pt with RA  Hist X / Hemorrhage Idiopathic  Will rx for infection first then proceed with HRCT and perhaps FOB/ bal next    Discussed in detail all the  indications, usual  risks and alternatives  relative to the benefits with patient who agrees to proceed with w/u as above   I had an extended discussion with the patient reviewing all relevant studies completed to date and  lasting 15 to 20 minutes of a 25 minute visit    Each maintenance medication was reviewed in detail including most importantly the difference between maintenance and prns and under what circumstances the prns are to be triggered using an action plan format that is not  reflected in the computer generated alphabetically organized AVS.    Please see AVS for specific instructions unique to this visit that I personally wrote and verbalized to the the pt in detail and then reviewed with pt  by my nurse highlighting any  changes in therapy recommended at today's visit to their plan of care.

## 2016-10-26 ENCOUNTER — Ambulatory Visit: Payer: Medicare Other | Admitting: Internal Medicine

## 2016-11-06 ENCOUNTER — Ambulatory Visit (INDEPENDENT_AMBULATORY_CARE_PROVIDER_SITE_OTHER)
Admission: RE | Admit: 2016-11-06 | Discharge: 2016-11-06 | Disposition: A | Discharge status: Home / Self Care | Payer: Medicare Other | Source: Ambulatory Visit | Attending: Internal Medicine | Admitting: Internal Medicine

## 2016-11-06 DIAGNOSIS — J181 Lobar pneumonia, unspecified organism: Secondary | ICD-10-CM | POA: Diagnosis not present

## 2016-11-06 DIAGNOSIS — J189 Pneumonia, unspecified organism: Secondary | ICD-10-CM | POA: Diagnosis not present

## 2016-11-09 NOTE — Progress Notes (Signed)
Spoke with pt and notified of results per Dr. Wert. Pt verbalized understanding and denied any questions. 

## 2016-11-10 ENCOUNTER — Encounter: Payer: Self-pay | Admitting: Adult Health

## 2016-11-10 ENCOUNTER — Ambulatory Visit (INDEPENDENT_AMBULATORY_CARE_PROVIDER_SITE_OTHER): Payer: Medicare Other | Admitting: Adult Health

## 2016-11-10 DIAGNOSIS — J45991 Cough variant asthma: Secondary | ICD-10-CM

## 2016-11-10 DIAGNOSIS — R918 Other nonspecific abnormal finding of lung field: Secondary | ICD-10-CM | POA: Diagnosis not present

## 2016-11-10 NOTE — Patient Instructions (Addendum)
Continue on current regimen Decrease Medrol 6mg  daily for 1 week then begin 4mg  daily .  Follow med calendar closely and bring to each visit Follow up with Dr. Melvyn Novas  In 2 months with PFT

## 2016-11-10 NOTE — Assessment & Plan Note (Signed)
Recent flare now resolving  Decrease medrol  If remains good, can look to decrease gabapentin on return  Patient's medications were reviewed today and patient education was given. Computerized medication calendar was adjusted/completed    Plan  Patient Instructions  Continue on current regimen Decrease Medrol 6mg  daily for 1 week then begin 4mg  daily .  Follow med calendar closely and bring to each visit Follow up with Dr. Melvyn Novas  In 2 months with PFT

## 2016-11-10 NOTE — Assessment & Plan Note (Signed)
Stable on CT chest  Check PFT on return  Cont to follow

## 2016-11-10 NOTE — Progress Notes (Signed)
[name]  ID: Stacey [name], female    DOB: [birth year], 76 y.o.   MRN: [id number]  Chief Complaint  Patient presents with  . Follow-up    Referring provider: Deland Pretty, MD  HPI: 76 year old female never smoker followed for cough variant asthma , multiple lung nodules vs RA lung dz.   TEST  See CT 05/16/13  1. The suspected pulmonary nodule in the right lung apex is actually  a calcified posterior pleural plaque and is benign in appearance.  2. Nodularity in the lateral aspect of the right upper lobe is felt  to be inflammatory in origin. However, there is a 9 mm nodule in  this area.  - PFT's 07/17/13 VC 74% with no obst and dlco 69 correctrs to 85% - CT 10/16/13 1. Stable chronic lung disease with scattered subpleural scarring and asymmetric right upper lobe subpleural nodularity, likely postinflammatory (prior granulomatous disease). 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 2. No suspicious pulmonary nodule or acute process demonstrated. - CT 10/21/2015 Mild growth of irregular 1.0 cm subpleural left upper lobe pulmonary nodule since 10/16/2013, cannot exclude an indolent primary bronchogenic carcinoma.  -  PET 11/01/2015 > neg with ild c/w RA - PFT's  02/07/2016  VC  1.99 (65%)   study with DLCO  67/68 % corrects to 89 % for alv volume   10/2016 HRCT chest > patchy multifocal areas of groundglass attenuation bilaterally consistent with interstitial lung disease. No frank honeycombing. Few mild bronchiectatic areas. Bilateral apical thickening consistent with scarring.   11/10/2016 Follow up : Cough Variant Asthma/Multiple lung nodules /RA lung dz Patient returns for a one-month follow-up. Patient says she is feeling much better.  She was having a flare last ov with increased cough and congesiton . She was treated with Levaquin x 10 days. CXR showed increased inflammation in upper lobes. Subsequent CT chest done on 11/06/2016 showed interstitial lung disease with no  significant progression of finding since previous study in April 2017.   We reviewed all her medications organize them into a medication count with patient education Appears she is taking them correctly     Allergies  Allergen Reactions  . Penicillins     unknown  . Remicade [Infliximab]   . Sulfa Antibiotics     Unknown reaction    Immunization History  Administered Date(s) Administered  . Influenza Split 04/12/2013, 04/12/2014, 03/14/2015, 04/23/2016  . Pneumococcal Conjugate-13 09/28/2014  . Pneumococcal Polysaccharide-23 07/13/2012    Past Medical History:  Diagnosis Date  . Abnormal finding of blood chemistry   . Asthma   . H/O measles   . H/O varicella   . Hypertension   . 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 Lichen sclerosus 80/32/12   Asymptomatic  . Low iron   . Mitral valve prolapse   . Osteoarthritis   . Osteoporosis   . Post herpetic neuralgia   . Rheumatoid arthritis(714.0)   . Yeast infection     Tobacco History: History  Smoking Status  . Never Smoker  Smokeless Tobacco  . Never Used   Counseling given: Not Answered   Outpatient Encounter Prescriptions as of 11/10/2016  Medication Sig  . abatacept (ORENCIA) 250 MG injection Inject into the vein.  Marland Kitchen amitriptyline (ELAVIL) 25 MG tablet Take 25 mg by mouth at bedtime.  . beta carotene w/minerals (OCUVITE) tablet Take 1 tablet by mouth daily.  . cholecalciferol (VITAMIN D) 1000 UNITS tablet Take 1,000 Units by mouth daily.  Marland Kitchen gabapentin (NEURONTIN) 300  MG capsule 1 in am and 2 in pm  . levofloxacin (LEVAQUIN) 500 MG tablet Take 1 tablet (500 mg total) by mouth daily.  Marland Kitchen losartan (COZAAR) 25 MG tablet Take 25 mg by mouth daily.  . methylPREDNISolone (MEDROL) 4 MG tablet 2 each am  . pantoprazole (PROTONIX) 40 MG tablet Take 1 tablet (40 mg total) by mouth daily. Take 30-60 min before first meal of the day  . ranitidine (ZANTAC) 150 MG tablet 2 at bedtime  . Respiratory Therapy Supplies (FLUTTER)  DEVI Use as directed  . venlafaxine XR (EFFEXOR-XR) 75 MG 24 hr capsule Take 75 mg by mouth at bedtime.    No facility-administered encounter medications on file as of 11/10/2016.      Review of Systems  Constitutional:   No  weight loss, night sweats,  Fevers, chills, + fatigue, or  lassitude.  HEENT:   No headaches,  Difficulty swallowing,  Tooth/dental problems, or  Sore throat,                No sneezing, itching, ear ache, nasal congestion, post nasal drip,   CV:  No chest pain,  Orthopnea, PND, swelling in lower extremities, anasarca, dizziness, palpitations, syncope.   GI  No heartburn, indigestion, abdominal pain, nausea, vomiting, diarrhea, change in bowel habits, loss of appetite, bloody stools.   Resp:   No chest wall deformity  Skin: no rash or lesions.  GU: no dysuria, change in color of urine, no urgency or frequency.  No flank pain, no hematuria   MS:  No joint pain or swelling.  No decreased range of motion.  No back pain.    Physical Exam  BP 110/64 (BP Location: Left Arm, Cuff Size: Normal)   Pulse 70   Ht '5\' 6"'  (1.676 m)   Wt 140 lb 12.8 oz (63.9 kg)   SpO2 94%   BMI 22.73 kg/m   GEN: A/Ox3; pleasant , NAD,    HEENT:  [place]/AT,  EACs-clear, TMs-wnl, NOSE-clear, THROAT-clear, no lesions, no postnasal drip or exudate noted.   NECK:  Supple w/ fair ROM; no JVD; normal carotid impulses w/o bruits; no thyromegaly or nodules palpated; no lymphadenopathy.    RESP  Few BB crackles ,  no accessory muscle use, no dullness to percussion  CARD:  RRR, no m/r/g, no peripheral edema, pulses intact, no cyanosis or clubbing.  GI:   Soft & nt; nml bowel sounds; no organomegaly or masses detected.   Musco: Warm bil, no deformities or joint swelling noted.   Neuro: alert, no focal deficits noted.    Skin: Warm, no lesions or rashes    Lab Results:  CBC    Component Value Date/Time   WBC 7.1 12/18/2009 0020   RBC 3.97 12/18/2009 0020   HGB 12.9 12/18/2009  0347   HCT 38.0 12/18/2009 0347   PLT 214 12/18/2009 0020   MCV 92.2 12/18/2009 0020   MCHC 33.5 12/18/2009 0020   RDW 14.8 12/18/2009 0020   LYMPHSABS 3.2 12/18/2009 0020   MONOABS 0.8 12/18/2009 0020   EOSABS 0.3 12/18/2009 0020   BASOSABS 0.1 12/18/2009 0020    BMET    Component Value Date/Time   NA 140 12/18/2009 0347   K 4.2 12/18/2009 0347   CL 103 12/18/2009 0347   CO2 33 (H) 05/05/2007 1358   GLUCOSE 85 12/18/2009 0347   BUN 9 12/18/2009 0347   CREATININE 0.8 12/18/2009 0347   CALCIUM 9.7 05/05/2007 1358   GFRNONAA >60 05/05/2007  [place]  05/05/2007 1358    >60        The eGFR has been calculated using the MDRD equation. This calculation has not been validated in all clinical    BNP No results found for: BNP  ProBNP No results found for: PROBNP  Imaging: Dg Chest 2 View  Result Date: 10/23/2016 CLINICAL DATA:  Dry cough intermittent for 6 weeks. EXAM: CHEST  2 VIEW COMPARISON:  10/15/2016 FINDINGS: There is hyperinflation of the lungs compatible with COPD. Biapical scarring. Heart is normal size. No acute confluent airspace opacities. Linear densities at the lung bases, left greater than right compatible with scarring. No effusions or acute bony abnormality. Aortic atherosclerosis. IMPRESSION: COPD/chronic changes.  No active disease. Aortic atherosclerosis. Electronically Signed   By: Rolm Baptise M.D.   On: 10/23/2016 08:24   Ct Chest High Resolution  Result Date: 11/06/2016 CLINICAL DATA:  75 year old female with history of pneumonia in early April. Followup study. No current shortness of breath, coughing or wheezing. EXAM: CT CHEST WITHOUT CONTRAST TECHNIQUE: Multidetector CT imaging of the chest was performed following the standard protocol without intravenous contrast. High resolution imaging of the lungs, as well as inspiratory and expiratory imaging, was performed. COMPARISON:  Chest CT 10/17/2015. FINDINGS: Cardiovascular: Heart size is normal. There  is no significant pericardial fluid, thickening or pericardial calcification. There is aortic atherosclerosis, as well as atherosclerosis of the great vessels of the mediastinum and the coronary arteries, including calcified atherosclerotic plaque in the left main and left anterior descending coronary arteries. Calcifications of the aortic valve and mitral annulus. Aberrant right subclavian artery (normal anatomical variant) incidentally noted. Mediastinum/Nodes: No pathologically enlarged mediastinal or hilar lymph nodes. Please note that accurate exclusion of hilar adenopathy is limited on noncontrast CT scans. Esophagus is unremarkable in appearance. No axillary lymphadenopathy. Lungs/Pleura: Patchy multifocal areas of peripheral predominant ground-glass attenuation and septal thickening and again noted throughout all aspects of the lungs bilaterally. Findings are most prevalent throughout the lung apices and the lung bases with relative sparing of the mid lungs. Scattered areas of peripheral bronchiolectasis are noted. No frank honeycombing. A few scattered areas of mild cylindrical bronchiectasis are also noted. Inspiratory and expiratory imaging demonstrates moderate air trapping indicative of small airways disease. These findings appear essentially stable compared to the prior study. No acute consolidative airspace disease. No pleural effusions. Extensive areas of bilateral apical predominant pleuroparenchymal thickening and architectural distortion, somewhat nodular in appearance, similar to prior examinations, including the area of subpleural nodularity of concern on prior study from 10/17/2015; these findings are most compatible with chronic post infectious or inflammatory scarring. Pleural calcifications are also noted throughout the apical portions of the thorax bilaterally. Upper Abdomen: Aortic atherosclerosis.  Status post cholecystectomy. Musculoskeletal: There are no aggressive appearing lytic or  blastic lesions noted in the visualized portions of the skeleton. IMPRESSION: 1. The appearance of the chest remains compatible with interstitial lung disease. 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 No significant progression of findings compared to the prior study. 2. Chronic areas of pleural-parenchymal thickening/calcification again noted throughout the apical portions of the lungs bilaterally, most compatible with areas of chronic post infectious or inflammatory scarring. 3. Aortic atherosclerosis, in addition to left main and left anterior descending coronary artery disease. Assessment for potential risk factor modification, dietary therapy or pharmacologic therapy may be warranted, if clinically indicated. 4. There are calcifications of the aortic valve and mitral annulus.  Echocardiographic correlation for evaluation of potential valvular dysfunction may be warranted if clinically indicated. Electronically Signed   By: Vinnie Langton M.D.   On: 11/06/2016 14:04     Assessment & Plan:   Multiple pulmonary nodules/RA lung dz  Stable on CT chest  Check PFT on return  Cont to follow    Cough variant asthma vs uacs  Recent flare now resolving  Decrease medrol  If remains good, can look to decrease gabapentin on return  Patient's medications were reviewed today and patient education was given. Computerized medication calendar was adjusted/completed    Plan  Patient Instructions  Continue on current regimen Decrease Medrol 58m daily for 1 week then begin 457mdaily .  Follow med calendar closely and bring to each visit Follow up with Dr. WeMelvyn NovasIn 2 months with PFT         TaRexene EdisonNP 11/10/2016

## 2016-11-10 NOTE — Progress Notes (Signed)
Chart and office note reviewed in detail  > agree with a/p as outlined    

## 2016-11-11 NOTE — Addendum Note (Signed)
Addended by: Parke Poisson E on: 11/11/2016 02:57 PM   Modules accepted: Orders

## 2016-11-18 DIAGNOSIS — M069 Rheumatoid arthritis, unspecified: Secondary | ICD-10-CM | POA: Diagnosis not present

## 2016-11-18 DIAGNOSIS — Z79899 Other long term (current) drug therapy: Secondary | ICD-10-CM | POA: Diagnosis not present

## 2016-11-20 NOTE — Addendum Note (Signed)
Addended by: Parke Poisson E on: 11/20/2016 12:25 PM   Modules accepted: Orders

## 2016-11-27 DIAGNOSIS — M0510 Rheumatoid lung disease with rheumatoid arthritis of unspecified site: Secondary | ICD-10-CM | POA: Diagnosis not present

## 2016-11-27 DIAGNOSIS — J159 Unspecified bacterial pneumonia: Secondary | ICD-10-CM | POA: Diagnosis not present

## 2016-11-27 DIAGNOSIS — D899 Disorder involving the immune mechanism, unspecified: Secondary | ICD-10-CM | POA: Diagnosis not present

## 2016-11-30 DIAGNOSIS — M069 Rheumatoid arthritis, unspecified: Secondary | ICD-10-CM | POA: Diagnosis not present

## 2016-12-23 DIAGNOSIS — M069 Rheumatoid arthritis, unspecified: Secondary | ICD-10-CM | POA: Diagnosis not present

## 2017-01-11 ENCOUNTER — Ambulatory Visit (INDEPENDENT_AMBULATORY_CARE_PROVIDER_SITE_OTHER): Payer: Medicare Other | Admitting: Internal Medicine

## 2017-01-11 ENCOUNTER — Encounter: Payer: Self-pay | Admitting: Internal Medicine

## 2017-01-11 VITALS — BP 104/60 | HR 80 | Ht 66.0 in | Wt 146.0 lb

## 2017-01-11 DIAGNOSIS — R918 Other nonspecific abnormal finding of lung field: Secondary | ICD-10-CM | POA: Diagnosis not present

## 2017-01-11 DIAGNOSIS — J45991 Cough variant asthma: Secondary | ICD-10-CM

## 2017-01-11 DIAGNOSIS — J984 Other disorders of lung: Secondary | ICD-10-CM

## 2017-01-11 LAB — PULMONARY FUNCTION TEST
DL/VA % pred: 74 %
DL/VA: 3.73 ml/min/mmHg/L
DLCO cor % pred: 59 %
DLCO cor: 16.12 ml/min/mmHg
DLCO unc % pred: 61 %
DLCO unc: 16.5 ml/min/mmHg
FEF 25-75 Post: 1.49 L/sec
FEF 25-75 Pre: 1.42 L/sec
FEF2575-%Change-Post: 5 %
FEF2575-%Pred-Post: 88 %
FEF2575-%Pred-Pre: 84 %
FEV1-%Change-Post: 1 %
FEV1-%Pred-Post: 75 %
FEV1-%Pred-Pre: 74 %
FEV1-Post: 1.69 L
FEV1-Pre: 1.66 L
FEV1FVC-%Change-Post: 0 %
FEV1FVC-%Pred-Pre: 104 %
FEV6-%Change-Post: 1 %
FEV6-%Pred-Post: 76 %
FEV6-%Pred-Pre: 75 %
FEV6-Post: 2.17 L
FEV6-Pre: 2.13 L
FEV6FVC-%Change-Post: 0 %
FEV6FVC-%Pred-Post: 105 %
FEV6FVC-%Pred-Pre: 104 %
FVC-%Change-Post: 1 %
FVC-%Pred-Post: 72 %
FVC-%Pred-Pre: 71 %
FVC-Post: 2.17 L
FVC-Pre: 2.14 L
Post FEV1/FVC ratio: 78 %
Post FEV6/FVC ratio: 100 %
Pre FEV1/FVC ratio: 77 %
Pre FEV6/FVC Ratio: 99 %
RV % pred: 131 %
RV: 3.2 L
TLC % pred: 95 %
TLC: 5.11 L

## 2017-01-11 NOTE — Patient Instructions (Signed)
Return yearly with pfts on return  - call sooner if needed

## 2017-01-11 NOTE — Progress Notes (Signed)
Subjective:   Patient ID: Stacey [name], female    DOB: 1941-07-11     MRN: [id number]    Brief patient profile:  63 yowf  never smoker with allergies/inhalers as child outgrew by [employer] then  RA since around 2000  Prednisone  x decades and prev eval by Dr Joya Gaskins around 2004 for sob resolved s maint rx and referred 05/26/2013 by Dr Shelia Media for bronchitis and abn cxr   History of Present Illness  05/26/2013 1st [facility] office visit/ Wert cc June 2014 dx pna  In [place] and remicade stopped and 100% better and placed arencia in September 2014  then abruptly worse first week in November with cough green sputum s nasal symptoms, fever low grade and no cp or cough and completely recovered prior to [place] does not recall abx but issue is why keeps getting sick and abn CT Chest (see below).   Arthritis symptoms well controlled at present on Rx for RA rec Nexium 40 mg Take 30-60 min before first meal of the day and add pepcid 20 mg one at bedtime whenever coughing.   07/17/2013 f/u ov/Wert re:  RA/ recurrent cough / pred at 4 mg per [place] Flemings plus orencia Chief Complaint  Patient presents with  . Followup with PFT    Pt states she is doing well and denies any co's today.   no longer sign cough. rec Nexium 40 mg Take 30-60 min before first meal of the day and add pepcid 20 mg one at bedtime whenever coughing.      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 Follow-up    Pt states Dr Shelia Media wanted her to f/u on MPN.  She c/o occ cough- non prod.   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  Follow-up    25mo rov. PFT results. breathing is baseline. no new conerns today.   minimal cough controlled with jolly ranchers / Not limited by breathing from desired activities   rec f/u prn    09/25/2016 acute extended ov/Wert referred back by Dr Shelia Media re:  RA/ MPNS on zantac 150 am/ hs on maint medrol  Chief Complaint  Patient presents with  . Acute Visit    cough    acutely started coughing mid Feb 2018  eval by PA Dx copd rx abx/prednisone taper then back on medrol > much better  Same symptoms 09/11/16 assoc slt running nose> watery  assoc sense of wheezing 24/7  eval by NP  Dx copd / allergy/ asthma > rx zyrtec and BREO and neb > no better  Seen 09/23/15 with cxr  With minimal acute on chronic changes diffusely esp LUL  > no change rx but stopped BREO  Now cough better off BREO / still bothers intermittently during the day but does not disturb sleep and denies  excess/ purulent sputum or mucus plugs   rec Try prilosec otc 40mg   Take 30-60 min before first meal of the day and zantac 150  mg after supper and  one @  bedtime until cough is completely gone for at least a week without the need for cough suppression For cough mucinex dm 1200 mg every 12 hours as needed  Prednisone 10 mg take  4 each am x 2 days,   2 each am x 2 days,  1 each am x 2 days and stop  Start symbicort 80 Take 2 puffs first thing in am and then another 2 puffs about 12 hours later and stop after 2 weeks  Please schedule a follow up office visit in 4 weeks, sooner if needed with cxr on return     10/22/2016  f/u ov/Wert re:  RA/ MPNs/ Medrol 4mg  daily  Chief Complaint  Patient presents with  . Follow-up    cough unchanged, cough mostly nonprod cough with clear mucus.    refilled symbicort though did not seem to work so continued it until saw Dr Shelia Media and he replaced it with albuterol and seemed better p that in terms of cough which continues mostly dry and day > noct/ better p short course prednisone then flared  again ? While still on symbicort rec Stop the prilosec and change to pantoprazole (protonix) 40 mg   Take  30-60 min before first meal of the day and  Zantac 300  mg one @  bedtime until return to office - this is the best way to tell whether stomach acid is contributing to your problem.   GERD diet If coughing > use the  flutter valve as much as you can  Change gabapentin to 300 mg in am and 600 mg in pm  Double the medrol to take 2 each am  Ok to use to be nebulizer up to 4 hours if needed     11/10/16  NP f/u ov rec Continue on current regimen Decrease Medrol 6mg  daily for 1 week then begin 4mg  daily .  Follow med calendar closely and bring to each visit    01/11/2017  f/u ov/Wert re: RA/ mpns/ uacs  On gabapentin/ not using any inhalers or nebs now @ medrodl 4mg  daily  Chief Complaint  Patient presents with  . Follow-up    PFT's done today. Breathing is doing well and no new co's.  cough is better Breathing is not really limiting though no aerobics/ does housework/ shopping ok Arthritis is some better on orencia   No obvious day to day or daytime variability or assoc excess/ purulent sputum or mucus plugs or hemoptysis or cp or chest tightness, subjective wheeze or overt sinus or hb symptoms. No unusual exp hx or h/o childhood pna/ asthma or knowledge of premature birth.  Sleeping ok without nocturnal  or early am exacerbation  of respiratory  c/o's or need for noct saba. Also denies any obvious fluctuation of symptoms with weather or environmental changes or other aggravating or alleviating factors except as outlined above   Current Medications, Allergies, Complete Past Medical History, Past Surgical History, Family History, and Social History were reviewed in [employer] record.  ROS  The following are not active complaints unless bolded sore throat, dysphagia, dental problems, itching, sneezing,  nasal congestion or excess/ purulent secretions, ear ache,    fever, chills, sweats, unintended wt loss, classically pleuritic or exertional cp,  orthopnea pnd or leg swelling, presyncope, palpitations, abdominal pain, anorexia, nausea, vomiting, diarrhea  or change in bowel or bladder habits, change in stools or urine, dysuria,hematuria,  rash, arthralgias, visual complaints,  headache, numbness, weakness or ataxia or problems with walking or coordination,  change in mood/affect or memory.                            Objective:   Physical Exam  amb pleasant  Elderly wf nad   Vita signs reviewed:  - Note on arrival 02 sats  93% on RA    07/17/2013  146  > 09/28/2014   142 > 04/18/2015  145 > 11/01/2015 143 > 02/07/2016  143 > 09/25/2016  139 > 10/22/2016  140 >  01/11/2017 146     05/26/13 147 lb (66.679 kg)  01/13/12 145 lb (65.772 kg)      HEENT: nl dentition, turbinates, and orophanx. Nl external ear canals without cough reflex   NECK :  without JVD/Nodes/TM/ nl carotid upstrokes bilaterally   LUNGS: no acc muscle use,  Scattered insp pops/ squeaks, min exp rhonchi bilaterally    CV:  RRR  no s3 or murmur or increase in P2, no edema   ABD:  soft and nontender with nl excursion in the supine position. No bruits or organomegaly, bowel sounds nl  MS:  warm without deformities, calf tenderness, cyanosis or clubbing  SKIN: warm and dry without lesions    NEURO:  alert, approp, no deficits/ mild resting tremor           Assessment & Plan:

## 2017-01-11 NOTE — Progress Notes (Signed)
PFT done today. 

## 2017-01-12 ENCOUNTER — Encounter: Payer: Self-pay | Admitting: Internal Medicine

## 2017-01-12 NOTE — Assessment & Plan Note (Signed)
-   04/18/15 rec max rx for gerd > resolve - recurred Feb 2018 > max gerd rx added 09/25/2016  - 09/25/2016  After extensive coaching HFA effectiveness =    75% so try symbicort 80 2bid x one sample only  - improved on higher doses of pred and saba by report 10/22/2016 so rec medrol 4 mg double dose until better then resume previous dose  - flutter valve teaching done 10/22/2016   Improved off all inhalers, no airflow obst by pfts 01/11/2017 > continue rx for gerd/ irritable larynx

## 2017-01-12 NOTE — Assessment & Plan Note (Signed)
-   See CT 05/16/13  1. The suspected pulmonary nodule in the right lung apex is actually  a calcified posterior pleural plaque and is benign in appearance.  2. Nodularity in the lateral aspect of the right upper lobe is felt  to be inflammatory in origin. However, there is a 9 mm nodule in  this area.  - PFT's 07/17/13 VC 74% with no obst and dlco 69 correctrs to 85% - CT 10/16/13 1. Stable chronic lung disease with scattered subpleural scarring and asymmetric right upper lobe subpleural nodularity, likely postinflammatory (prior granulomatous disease). 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 2. No suspicious pulmonary nodule or acute process demonstrated. - CT 10/21/2015 Mild growth of irregular 1.0 cm subpleural left upper lobe pulmonary nodule since 10/16/2013, cannot exclude an indolent primary bronchogenic carcinoma.  -  PET 11/01/2015 > neg with ild c/w RA - PFT's  02/07/2016  VC  1.99 (65%)   study with DLCO  67/68 % corrects to 89 % for alv volume   - HRCT rec p 10 days of levaquin 10/23/2016  - HRCT 11/06/2016  1. The appearance of the chest remains compatible with interstitial lung disease. 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 No significant progression of findings compared to the prior study. 2. Chronic areas of pleural-parenchymal thickening/calcification again noted throughout the apical portions of the lungs bilaterally, most compatible with areas of chronic post infectious or inflammatory scarring. 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  Please see AVS for specific  Instructions which are unique to this visit and I personally typed out  which were reviewed in detail in writing with the patient and a copy provided.

## 2017-01-12 NOTE — Assessment & Plan Note (Signed)
See RA lung dz sep a/p

## 2017-01-14 DIAGNOSIS — Z961 Presence of intraocular lens: Secondary | ICD-10-CM | POA: Diagnosis not present

## 2017-01-14 DIAGNOSIS — H532 Diplopia: Secondary | ICD-10-CM | POA: Diagnosis not present

## 2017-01-19 DIAGNOSIS — R269 Unspecified abnormalities of gait and mobility: Secondary | ICD-10-CM | POA: Diagnosis not present

## 2017-01-19 DIAGNOSIS — M25571 Pain in right ankle and joints of right foot: Secondary | ICD-10-CM | POA: Diagnosis not present

## 2017-01-19 DIAGNOSIS — M25572 Pain in left ankle and joints of left foot: Secondary | ICD-10-CM | POA: Diagnosis not present

## 2017-01-19 DIAGNOSIS — M25541 Pain in joints of right hand: Secondary | ICD-10-CM | POA: Diagnosis not present

## 2017-01-20 DIAGNOSIS — M069 Rheumatoid arthritis, unspecified: Secondary | ICD-10-CM | POA: Diagnosis not present

## 2017-01-21 ENCOUNTER — Other Ambulatory Visit: Payer: Self-pay | Admitting: Internal Medicine

## 2017-01-28 DIAGNOSIS — M25541 Pain in joints of right hand: Secondary | ICD-10-CM | POA: Diagnosis not present

## 2017-01-28 DIAGNOSIS — M25571 Pain in right ankle and joints of right foot: Secondary | ICD-10-CM | POA: Diagnosis not present

## 2017-01-28 DIAGNOSIS — M25572 Pain in left ankle and joints of left foot: Secondary | ICD-10-CM | POA: Diagnosis not present

## 2017-01-28 DIAGNOSIS — R269 Unspecified abnormalities of gait and mobility: Secondary | ICD-10-CM | POA: Diagnosis not present

## 2017-02-01 DIAGNOSIS — M25541 Pain in joints of right hand: Secondary | ICD-10-CM | POA: Diagnosis not present

## 2017-02-01 DIAGNOSIS — M25571 Pain in right ankle and joints of right foot: Secondary | ICD-10-CM | POA: Diagnosis not present

## 2017-02-01 DIAGNOSIS — R269 Unspecified abnormalities of gait and mobility: Secondary | ICD-10-CM | POA: Diagnosis not present

## 2017-02-01 DIAGNOSIS — M25572 Pain in left ankle and joints of left foot: Secondary | ICD-10-CM | POA: Diagnosis not present

## 2017-02-11 DIAGNOSIS — M25541 Pain in joints of right hand: Secondary | ICD-10-CM | POA: Diagnosis not present

## 2017-02-11 DIAGNOSIS — R269 Unspecified abnormalities of gait and mobility: Secondary | ICD-10-CM | POA: Diagnosis not present

## 2017-02-11 DIAGNOSIS — M25572 Pain in left ankle and joints of left foot: Secondary | ICD-10-CM | POA: Diagnosis not present

## 2017-02-11 DIAGNOSIS — M25571 Pain in right ankle and joints of right foot: Secondary | ICD-10-CM | POA: Diagnosis not present

## 2017-02-17 DIAGNOSIS — M069 Rheumatoid arthritis, unspecified: Secondary | ICD-10-CM | POA: Diagnosis not present

## 2017-02-17 DIAGNOSIS — Z79899 Other long term (current) drug therapy: Secondary | ICD-10-CM | POA: Diagnosis not present

## 2017-02-18 DIAGNOSIS — M25571 Pain in right ankle and joints of right foot: Secondary | ICD-10-CM | POA: Diagnosis not present

## 2017-02-18 DIAGNOSIS — Z1231 Encounter for screening mammogram for malignant neoplasm of breast: Secondary | ICD-10-CM | POA: Diagnosis not present

## 2017-02-18 DIAGNOSIS — M25541 Pain in joints of right hand: Secondary | ICD-10-CM | POA: Diagnosis not present

## 2017-02-18 DIAGNOSIS — M25572 Pain in left ankle and joints of left foot: Secondary | ICD-10-CM | POA: Diagnosis not present

## 2017-02-18 DIAGNOSIS — R269 Unspecified abnormalities of gait and mobility: Secondary | ICD-10-CM | POA: Diagnosis not present

## 2017-02-18 DIAGNOSIS — Z803 Family history of malignant neoplasm of breast: Secondary | ICD-10-CM | POA: Diagnosis not present

## 2017-02-24 DIAGNOSIS — M25541 Pain in joints of right hand: Secondary | ICD-10-CM | POA: Diagnosis not present

## 2017-02-24 DIAGNOSIS — M25571 Pain in right ankle and joints of right foot: Secondary | ICD-10-CM | POA: Diagnosis not present

## 2017-02-24 DIAGNOSIS — M25572 Pain in left ankle and joints of left foot: Secondary | ICD-10-CM | POA: Diagnosis not present

## 2017-02-24 DIAGNOSIS — R269 Unspecified abnormalities of gait and mobility: Secondary | ICD-10-CM | POA: Diagnosis not present

## 2017-03-03 DIAGNOSIS — M25572 Pain in left ankle and joints of left foot: Secondary | ICD-10-CM | POA: Diagnosis not present

## 2017-03-03 DIAGNOSIS — R269 Unspecified abnormalities of gait and mobility: Secondary | ICD-10-CM | POA: Diagnosis not present

## 2017-03-03 DIAGNOSIS — M25571 Pain in right ankle and joints of right foot: Secondary | ICD-10-CM | POA: Diagnosis not present

## 2017-03-03 DIAGNOSIS — M25541 Pain in joints of right hand: Secondary | ICD-10-CM | POA: Diagnosis not present

## 2017-03-08 DIAGNOSIS — M19031 Primary osteoarthritis, right wrist: Secondary | ICD-10-CM | POA: Diagnosis not present

## 2017-03-08 DIAGNOSIS — R05 Cough: Secondary | ICD-10-CM | POA: Diagnosis not present

## 2017-03-08 DIAGNOSIS — M25531 Pain in right wrist: Secondary | ICD-10-CM | POA: Diagnosis not present

## 2017-03-10 DIAGNOSIS — M25541 Pain in joints of right hand: Secondary | ICD-10-CM | POA: Diagnosis not present

## 2017-03-10 DIAGNOSIS — R269 Unspecified abnormalities of gait and mobility: Secondary | ICD-10-CM | POA: Diagnosis not present

## 2017-03-10 DIAGNOSIS — M25571 Pain in right ankle and joints of right foot: Secondary | ICD-10-CM | POA: Diagnosis not present

## 2017-03-10 DIAGNOSIS — M25572 Pain in left ankle and joints of left foot: Secondary | ICD-10-CM | POA: Diagnosis not present

## 2017-03-17 DIAGNOSIS — M069 Rheumatoid arthritis, unspecified: Secondary | ICD-10-CM | POA: Diagnosis not present

## 2017-03-18 DIAGNOSIS — H532 Diplopia: Secondary | ICD-10-CM | POA: Diagnosis not present

## 2017-03-18 DIAGNOSIS — M25571 Pain in right ankle and joints of right foot: Secondary | ICD-10-CM | POA: Diagnosis not present

## 2017-03-18 DIAGNOSIS — M25572 Pain in left ankle and joints of left foot: Secondary | ICD-10-CM | POA: Diagnosis not present

## 2017-03-18 DIAGNOSIS — J849 Interstitial pulmonary disease, unspecified: Secondary | ICD-10-CM | POA: Diagnosis not present

## 2017-03-18 DIAGNOSIS — M069 Rheumatoid arthritis, unspecified: Secondary | ICD-10-CM | POA: Diagnosis not present

## 2017-03-18 DIAGNOSIS — R269 Unspecified abnormalities of gait and mobility: Secondary | ICD-10-CM | POA: Diagnosis not present

## 2017-03-18 DIAGNOSIS — Z79899 Other long term (current) drug therapy: Secondary | ICD-10-CM | POA: Diagnosis not present

## 2017-03-18 DIAGNOSIS — M25541 Pain in joints of right hand: Secondary | ICD-10-CM | POA: Diagnosis not present

## 2017-03-22 DIAGNOSIS — M25572 Pain in left ankle and joints of left foot: Secondary | ICD-10-CM | POA: Diagnosis not present

## 2017-03-22 DIAGNOSIS — R269 Unspecified abnormalities of gait and mobility: Secondary | ICD-10-CM | POA: Diagnosis not present

## 2017-03-22 DIAGNOSIS — M25571 Pain in right ankle and joints of right foot: Secondary | ICD-10-CM | POA: Diagnosis not present

## 2017-03-22 DIAGNOSIS — M25541 Pain in joints of right hand: Secondary | ICD-10-CM | POA: Diagnosis not present

## 2017-03-30 DIAGNOSIS — M25572 Pain in left ankle and joints of left foot: Secondary | ICD-10-CM | POA: Diagnosis not present

## 2017-03-30 DIAGNOSIS — M25571 Pain in right ankle and joints of right foot: Secondary | ICD-10-CM | POA: Diagnosis not present

## 2017-03-30 DIAGNOSIS — R269 Unspecified abnormalities of gait and mobility: Secondary | ICD-10-CM | POA: Diagnosis not present

## 2017-03-30 DIAGNOSIS — M25541 Pain in joints of right hand: Secondary | ICD-10-CM | POA: Diagnosis not present

## 2017-04-05 DIAGNOSIS — M25541 Pain in joints of right hand: Secondary | ICD-10-CM | POA: Diagnosis not present

## 2017-04-05 DIAGNOSIS — M25571 Pain in right ankle and joints of right foot: Secondary | ICD-10-CM | POA: Diagnosis not present

## 2017-04-05 DIAGNOSIS — R269 Unspecified abnormalities of gait and mobility: Secondary | ICD-10-CM | POA: Diagnosis not present

## 2017-04-05 DIAGNOSIS — M25572 Pain in left ankle and joints of left foot: Secondary | ICD-10-CM | POA: Diagnosis not present

## 2017-04-13 DIAGNOSIS — M25541 Pain in joints of right hand: Secondary | ICD-10-CM | POA: Diagnosis not present

## 2017-04-13 DIAGNOSIS — M25572 Pain in left ankle and joints of left foot: Secondary | ICD-10-CM | POA: Diagnosis not present

## 2017-04-13 DIAGNOSIS — R269 Unspecified abnormalities of gait and mobility: Secondary | ICD-10-CM | POA: Diagnosis not present

## 2017-04-13 DIAGNOSIS — M25571 Pain in right ankle and joints of right foot: Secondary | ICD-10-CM | POA: Diagnosis not present

## 2017-04-19 DIAGNOSIS — Z961 Presence of intraocular lens: Secondary | ICD-10-CM | POA: Diagnosis not present

## 2017-04-19 DIAGNOSIS — H26499 Other secondary cataract, unspecified eye: Secondary | ICD-10-CM | POA: Diagnosis not present

## 2017-04-19 DIAGNOSIS — H532 Diplopia: Secondary | ICD-10-CM | POA: Diagnosis not present

## 2017-04-19 DIAGNOSIS — H5005 Alternating esotropia: Secondary | ICD-10-CM | POA: Diagnosis not present

## 2017-04-20 DIAGNOSIS — M25571 Pain in right ankle and joints of right foot: Secondary | ICD-10-CM | POA: Diagnosis not present

## 2017-04-20 DIAGNOSIS — M25541 Pain in joints of right hand: Secondary | ICD-10-CM | POA: Diagnosis not present

## 2017-04-20 DIAGNOSIS — R269 Unspecified abnormalities of gait and mobility: Secondary | ICD-10-CM | POA: Diagnosis not present

## 2017-04-20 DIAGNOSIS — M25572 Pain in left ankle and joints of left foot: Secondary | ICD-10-CM | POA: Diagnosis not present

## 2017-04-21 DIAGNOSIS — M069 Rheumatoid arthritis, unspecified: Secondary | ICD-10-CM | POA: Diagnosis not present

## 2017-04-22 DIAGNOSIS — Z23 Encounter for immunization: Secondary | ICD-10-CM | POA: Diagnosis not present

## 2017-04-28 DIAGNOSIS — M25541 Pain in joints of right hand: Secondary | ICD-10-CM | POA: Diagnosis not present

## 2017-04-28 DIAGNOSIS — M25572 Pain in left ankle and joints of left foot: Secondary | ICD-10-CM | POA: Diagnosis not present

## 2017-04-28 DIAGNOSIS — M25571 Pain in right ankle and joints of right foot: Secondary | ICD-10-CM | POA: Diagnosis not present

## 2017-04-28 DIAGNOSIS — R269 Unspecified abnormalities of gait and mobility: Secondary | ICD-10-CM | POA: Diagnosis not present

## 2017-04-30 DIAGNOSIS — Z85828 Personal history of other malignant neoplasm of skin: Secondary | ICD-10-CM | POA: Diagnosis not present

## 2017-04-30 DIAGNOSIS — L239 Allergic contact dermatitis, unspecified cause: Secondary | ICD-10-CM | POA: Diagnosis not present

## 2017-04-30 DIAGNOSIS — L218 Other seborrheic dermatitis: Secondary | ICD-10-CM | POA: Diagnosis not present

## 2017-05-12 DIAGNOSIS — R269 Unspecified abnormalities of gait and mobility: Secondary | ICD-10-CM | POA: Diagnosis not present

## 2017-05-12 DIAGNOSIS — M25571 Pain in right ankle and joints of right foot: Secondary | ICD-10-CM | POA: Diagnosis not present

## 2017-05-12 DIAGNOSIS — M25572 Pain in left ankle and joints of left foot: Secondary | ICD-10-CM | POA: Diagnosis not present

## 2017-05-12 DIAGNOSIS — M25541 Pain in joints of right hand: Secondary | ICD-10-CM | POA: Diagnosis not present

## 2017-05-17 DIAGNOSIS — Z79899 Other long term (current) drug therapy: Secondary | ICD-10-CM | POA: Diagnosis not present

## 2017-05-17 DIAGNOSIS — M069 Rheumatoid arthritis, unspecified: Secondary | ICD-10-CM | POA: Diagnosis not present

## 2017-05-19 DIAGNOSIS — M25572 Pain in left ankle and joints of left foot: Secondary | ICD-10-CM | POA: Diagnosis not present

## 2017-05-19 DIAGNOSIS — M25541 Pain in joints of right hand: Secondary | ICD-10-CM | POA: Diagnosis not present

## 2017-05-19 DIAGNOSIS — R269 Unspecified abnormalities of gait and mobility: Secondary | ICD-10-CM | POA: Diagnosis not present

## 2017-05-19 DIAGNOSIS — M25571 Pain in right ankle and joints of right foot: Secondary | ICD-10-CM | POA: Diagnosis not present

## 2017-05-25 ENCOUNTER — Other Ambulatory Visit: Payer: Self-pay | Admitting: Pulmonary Disease

## 2017-05-26 DIAGNOSIS — M25541 Pain in joints of right hand: Secondary | ICD-10-CM | POA: Diagnosis not present

## 2017-05-26 DIAGNOSIS — R269 Unspecified abnormalities of gait and mobility: Secondary | ICD-10-CM | POA: Diagnosis not present

## 2017-05-26 DIAGNOSIS — M25571 Pain in right ankle and joints of right foot: Secondary | ICD-10-CM | POA: Diagnosis not present

## 2017-05-26 DIAGNOSIS — M25572 Pain in left ankle and joints of left foot: Secondary | ICD-10-CM | POA: Diagnosis not present

## 2017-05-31 DIAGNOSIS — M25571 Pain in right ankle and joints of right foot: Secondary | ICD-10-CM | POA: Diagnosis not present

## 2017-05-31 DIAGNOSIS — M25572 Pain in left ankle and joints of left foot: Secondary | ICD-10-CM | POA: Diagnosis not present

## 2017-05-31 DIAGNOSIS — M25541 Pain in joints of right hand: Secondary | ICD-10-CM | POA: Diagnosis not present

## 2017-05-31 DIAGNOSIS — R269 Unspecified abnormalities of gait and mobility: Secondary | ICD-10-CM | POA: Diagnosis not present

## 2017-06-09 DIAGNOSIS — R269 Unspecified abnormalities of gait and mobility: Secondary | ICD-10-CM | POA: Diagnosis not present

## 2017-06-09 DIAGNOSIS — M25571 Pain in right ankle and joints of right foot: Secondary | ICD-10-CM | POA: Diagnosis not present

## 2017-06-09 DIAGNOSIS — M25572 Pain in left ankle and joints of left foot: Secondary | ICD-10-CM | POA: Diagnosis not present

## 2017-06-09 DIAGNOSIS — M25541 Pain in joints of right hand: Secondary | ICD-10-CM | POA: Diagnosis not present

## 2017-06-10 DIAGNOSIS — E7800 Pure hypercholesterolemia, unspecified: Secondary | ICD-10-CM | POA: Diagnosis not present

## 2017-06-10 DIAGNOSIS — E559 Vitamin D deficiency, unspecified: Secondary | ICD-10-CM | POA: Diagnosis not present

## 2017-06-10 DIAGNOSIS — M810 Age-related osteoporosis without current pathological fracture: Secondary | ICD-10-CM | POA: Diagnosis not present

## 2017-06-10 DIAGNOSIS — I10 Essential (primary) hypertension: Secondary | ICD-10-CM | POA: Diagnosis not present

## 2017-06-15 DIAGNOSIS — N393 Stress incontinence (female) (male): Secondary | ICD-10-CM | POA: Diagnosis not present

## 2017-06-15 DIAGNOSIS — I341 Nonrheumatic mitral (valve) prolapse: Secondary | ICD-10-CM | POA: Diagnosis not present

## 2017-06-15 DIAGNOSIS — D849 Immunodeficiency, unspecified: Secondary | ICD-10-CM | POA: Diagnosis not present

## 2017-06-15 DIAGNOSIS — N3941 Urge incontinence: Secondary | ICD-10-CM | POA: Diagnosis not present

## 2017-06-15 DIAGNOSIS — B0229 Other postherpetic nervous system involvement: Secondary | ICD-10-CM | POA: Diagnosis not present

## 2017-06-15 DIAGNOSIS — R05 Cough: Secondary | ICD-10-CM | POA: Diagnosis not present

## 2017-06-15 DIAGNOSIS — G629 Polyneuropathy, unspecified: Secondary | ICD-10-CM | POA: Diagnosis not present

## 2017-06-15 DIAGNOSIS — M069 Rheumatoid arthritis, unspecified: Secondary | ICD-10-CM | POA: Diagnosis not present

## 2017-06-15 DIAGNOSIS — I10 Essential (primary) hypertension: Secondary | ICD-10-CM | POA: Diagnosis not present

## 2017-06-15 DIAGNOSIS — E7800 Pure hypercholesterolemia, unspecified: Secondary | ICD-10-CM | POA: Diagnosis not present

## 2017-06-15 DIAGNOSIS — M810 Age-related osteoporosis without current pathological fracture: Secondary | ICD-10-CM | POA: Diagnosis not present

## 2017-06-15 DIAGNOSIS — K219 Gastro-esophageal reflux disease without esophagitis: Secondary | ICD-10-CM | POA: Diagnosis not present

## 2017-06-16 DIAGNOSIS — M25541 Pain in joints of right hand: Secondary | ICD-10-CM | POA: Diagnosis not present

## 2017-06-16 DIAGNOSIS — M25572 Pain in left ankle and joints of left foot: Secondary | ICD-10-CM | POA: Diagnosis not present

## 2017-06-16 DIAGNOSIS — R269 Unspecified abnormalities of gait and mobility: Secondary | ICD-10-CM | POA: Diagnosis not present

## 2017-06-16 DIAGNOSIS — M25571 Pain in right ankle and joints of right foot: Secondary | ICD-10-CM | POA: Diagnosis not present

## 2017-06-17 DIAGNOSIS — Z0000 Encounter for general adult medical examination without abnormal findings: Secondary | ICD-10-CM | POA: Diagnosis not present

## 2017-06-18 DIAGNOSIS — J849 Interstitial pulmonary disease, unspecified: Secondary | ICD-10-CM | POA: Diagnosis not present

## 2017-06-18 DIAGNOSIS — M069 Rheumatoid arthritis, unspecified: Secondary | ICD-10-CM | POA: Diagnosis not present

## 2017-06-18 DIAGNOSIS — B0229 Other postherpetic nervous system involvement: Secondary | ICD-10-CM | POA: Diagnosis not present

## 2017-06-18 DIAGNOSIS — Z79899 Other long term (current) drug therapy: Secondary | ICD-10-CM | POA: Diagnosis not present

## 2017-06-18 DIAGNOSIS — M8580 Other specified disorders of bone density and structure, unspecified site: Secondary | ICD-10-CM | POA: Diagnosis not present

## 2017-06-23 DIAGNOSIS — M25571 Pain in right ankle and joints of right foot: Secondary | ICD-10-CM | POA: Diagnosis not present

## 2017-06-23 DIAGNOSIS — R269 Unspecified abnormalities of gait and mobility: Secondary | ICD-10-CM | POA: Diagnosis not present

## 2017-06-23 DIAGNOSIS — M25541 Pain in joints of right hand: Secondary | ICD-10-CM | POA: Diagnosis not present

## 2017-06-23 DIAGNOSIS — M25572 Pain in left ankle and joints of left foot: Secondary | ICD-10-CM | POA: Diagnosis not present

## 2017-06-29 ENCOUNTER — Other Ambulatory Visit: Payer: Self-pay | Admitting: Internal Medicine

## 2017-06-29 DIAGNOSIS — R269 Unspecified abnormalities of gait and mobility: Secondary | ICD-10-CM | POA: Diagnosis not present

## 2017-06-29 DIAGNOSIS — M25572 Pain in left ankle and joints of left foot: Secondary | ICD-10-CM | POA: Diagnosis not present

## 2017-06-29 DIAGNOSIS — M25571 Pain in right ankle and joints of right foot: Secondary | ICD-10-CM | POA: Diagnosis not present

## 2017-06-29 DIAGNOSIS — M25541 Pain in joints of right hand: Secondary | ICD-10-CM | POA: Diagnosis not present

## 2017-07-15 DIAGNOSIS — N3946 Mixed incontinence: Secondary | ICD-10-CM | POA: Diagnosis not present

## 2017-07-15 DIAGNOSIS — N393 Stress incontinence (female) (male): Secondary | ICD-10-CM | POA: Diagnosis not present

## 2017-07-16 DIAGNOSIS — M25571 Pain in right ankle and joints of right foot: Secondary | ICD-10-CM | POA: Diagnosis not present

## 2017-07-16 DIAGNOSIS — M25541 Pain in joints of right hand: Secondary | ICD-10-CM | POA: Diagnosis not present

## 2017-07-16 DIAGNOSIS — R269 Unspecified abnormalities of gait and mobility: Secondary | ICD-10-CM | POA: Diagnosis not present

## 2017-07-16 DIAGNOSIS — M25572 Pain in left ankle and joints of left foot: Secondary | ICD-10-CM | POA: Diagnosis not present

## 2017-07-19 DIAGNOSIS — Z79899 Other long term (current) drug therapy: Secondary | ICD-10-CM | POA: Diagnosis not present

## 2017-07-19 DIAGNOSIS — M069 Rheumatoid arthritis, unspecified: Secondary | ICD-10-CM | POA: Diagnosis not present

## 2017-07-20 DIAGNOSIS — M25572 Pain in left ankle and joints of left foot: Secondary | ICD-10-CM | POA: Diagnosis not present

## 2017-07-20 DIAGNOSIS — R269 Unspecified abnormalities of gait and mobility: Secondary | ICD-10-CM | POA: Diagnosis not present

## 2017-07-20 DIAGNOSIS — M25541 Pain in joints of right hand: Secondary | ICD-10-CM | POA: Diagnosis not present

## 2017-07-20 DIAGNOSIS — M25571 Pain in right ankle and joints of right foot: Secondary | ICD-10-CM | POA: Diagnosis not present

## 2017-07-27 DIAGNOSIS — M25572 Pain in left ankle and joints of left foot: Secondary | ICD-10-CM | POA: Diagnosis not present

## 2017-07-27 DIAGNOSIS — L300 Nummular dermatitis: Secondary | ICD-10-CM | POA: Diagnosis not present

## 2017-07-27 DIAGNOSIS — M25571 Pain in right ankle and joints of right foot: Secondary | ICD-10-CM | POA: Diagnosis not present

## 2017-07-27 DIAGNOSIS — D692 Other nonthrombocytopenic purpura: Secondary | ICD-10-CM | POA: Diagnosis not present

## 2017-07-27 DIAGNOSIS — R269 Unspecified abnormalities of gait and mobility: Secondary | ICD-10-CM | POA: Diagnosis not present

## 2017-07-27 DIAGNOSIS — L218 Other seborrheic dermatitis: Secondary | ICD-10-CM | POA: Diagnosis not present

## 2017-07-27 DIAGNOSIS — L821 Other seborrheic keratosis: Secondary | ICD-10-CM | POA: Diagnosis not present

## 2017-07-27 DIAGNOSIS — M25541 Pain in joints of right hand: Secondary | ICD-10-CM | POA: Diagnosis not present

## 2017-07-27 DIAGNOSIS — D225 Melanocytic nevi of trunk: Secondary | ICD-10-CM | POA: Diagnosis not present

## 2017-07-27 DIAGNOSIS — Z85828 Personal history of other malignant neoplasm of skin: Secondary | ICD-10-CM | POA: Diagnosis not present

## 2017-07-27 DIAGNOSIS — L304 Erythema intertrigo: Secondary | ICD-10-CM | POA: Diagnosis not present

## 2017-07-30 DIAGNOSIS — M25522 Pain in left elbow: Secondary | ICD-10-CM | POA: Diagnosis not present

## 2017-07-30 DIAGNOSIS — R55 Syncope and collapse: Secondary | ICD-10-CM | POA: Diagnosis not present

## 2017-07-30 DIAGNOSIS — M7022 Olecranon bursitis, left elbow: Secondary | ICD-10-CM | POA: Diagnosis not present

## 2017-08-02 DIAGNOSIS — M069 Rheumatoid arthritis, unspecified: Secondary | ICD-10-CM | POA: Diagnosis not present

## 2017-08-02 DIAGNOSIS — M25522 Pain in left elbow: Secondary | ICD-10-CM | POA: Diagnosis not present

## 2017-08-02 DIAGNOSIS — M7022 Olecranon bursitis, left elbow: Secondary | ICD-10-CM | POA: Diagnosis not present

## 2017-08-02 DIAGNOSIS — M19079 Primary osteoarthritis, unspecified ankle and foot: Secondary | ICD-10-CM | POA: Diagnosis not present

## 2017-08-02 DIAGNOSIS — M7989 Other specified soft tissue disorders: Secondary | ICD-10-CM | POA: Diagnosis not present

## 2017-08-03 DIAGNOSIS — M7989 Other specified soft tissue disorders: Secondary | ICD-10-CM | POA: Diagnosis not present

## 2017-08-03 DIAGNOSIS — M7022 Olecranon bursitis, left elbow: Secondary | ICD-10-CM | POA: Diagnosis not present

## 2017-08-03 DIAGNOSIS — M19079 Primary osteoarthritis, unspecified ankle and foot: Secondary | ICD-10-CM | POA: Diagnosis not present

## 2017-08-04 DIAGNOSIS — M7022 Olecranon bursitis, left elbow: Secondary | ICD-10-CM | POA: Diagnosis not present

## 2017-08-04 DIAGNOSIS — M0579 Rheumatoid arthritis with rheumatoid factor of multiple sites without organ or systems involvement: Secondary | ICD-10-CM | POA: Diagnosis not present

## 2017-08-04 DIAGNOSIS — M85871 Other specified disorders of bone density and structure, right ankle and foot: Secondary | ICD-10-CM | POA: Diagnosis not present

## 2017-08-04 DIAGNOSIS — R600 Localized edema: Secondary | ICD-10-CM | POA: Diagnosis not present

## 2017-08-09 ENCOUNTER — Other Ambulatory Visit: Payer: Self-pay | Admitting: Rheumatology

## 2017-08-09 ENCOUNTER — Ambulatory Visit
Admission: RE | Admit: 2017-08-09 | Discharge: 2017-08-09 | Disposition: A | Discharge status: Home / Self Care | Payer: Medicare Other | Source: Ambulatory Visit | Attending: Rheumatology | Admitting: Rheumatology

## 2017-08-09 DIAGNOSIS — M7989 Other specified soft tissue disorders: Secondary | ICD-10-CM | POA: Diagnosis not present

## 2017-08-09 DIAGNOSIS — M79671 Pain in right foot: Secondary | ICD-10-CM | POA: Diagnosis not present

## 2017-08-09 DIAGNOSIS — R609 Edema, unspecified: Secondary | ICD-10-CM

## 2017-08-13 DIAGNOSIS — M0579 Rheumatoid arthritis with rheumatoid factor of multiple sites without organ or systems involvement: Secondary | ICD-10-CM | POA: Diagnosis not present

## 2017-08-13 DIAGNOSIS — M059 Rheumatoid arthritis with rheumatoid factor, unspecified: Secondary | ICD-10-CM | POA: Diagnosis not present

## 2017-08-13 DIAGNOSIS — M7022 Olecranon bursitis, left elbow: Secondary | ICD-10-CM | POA: Diagnosis not present

## 2017-08-19 ENCOUNTER — Other Ambulatory Visit: Payer: Self-pay | Admitting: Internal Medicine

## 2017-08-31 DIAGNOSIS — M069 Rheumatoid arthritis, unspecified: Secondary | ICD-10-CM | POA: Diagnosis not present

## 2017-08-31 DIAGNOSIS — M0510 Rheumatoid lung disease with rheumatoid arthritis of unspecified site: Secondary | ICD-10-CM | POA: Diagnosis not present

## 2017-08-31 DIAGNOSIS — M859 Disorder of bone density and structure, unspecified: Secondary | ICD-10-CM | POA: Diagnosis not present

## 2017-08-31 DIAGNOSIS — H532 Diplopia: Secondary | ICD-10-CM | POA: Diagnosis not present

## 2017-08-31 DIAGNOSIS — D8989 Other specified disorders involving the immune mechanism, not elsewhere classified: Secondary | ICD-10-CM | POA: Diagnosis not present

## 2017-08-31 DIAGNOSIS — I10 Essential (primary) hypertension: Secondary | ICD-10-CM | POA: Diagnosis not present

## 2017-08-31 DIAGNOSIS — R55 Syncope and collapse: Secondary | ICD-10-CM | POA: Diagnosis not present

## 2017-08-31 DIAGNOSIS — Z6822 Body mass index (BMI) 22.0-22.9, adult: Secondary | ICD-10-CM | POA: Diagnosis not present

## 2017-08-31 DIAGNOSIS — J449 Chronic obstructive pulmonary disease, unspecified: Secondary | ICD-10-CM | POA: Diagnosis not present

## 2017-08-31 DIAGNOSIS — D692 Other nonthrombocytopenic purpura: Secondary | ICD-10-CM | POA: Diagnosis not present

## 2017-08-31 DIAGNOSIS — B0229 Other postherpetic nervous system involvement: Secondary | ICD-10-CM | POA: Diagnosis not present

## 2017-08-31 DIAGNOSIS — I700 Atherosclerosis of aorta: Secondary | ICD-10-CM | POA: Diagnosis not present

## 2017-09-06 ENCOUNTER — Ambulatory Visit (INDEPENDENT_AMBULATORY_CARE_PROVIDER_SITE_OTHER)
Admission: RE | Admit: 2017-09-06 | Discharge: 2017-09-06 | Disposition: A | Discharge status: Home / Self Care | Payer: Medicare Other | Source: Ambulatory Visit | Attending: Adult Health | Admitting: Adult Health

## 2017-09-06 ENCOUNTER — Encounter: Payer: Self-pay | Admitting: Adult Health

## 2017-09-06 ENCOUNTER — Ambulatory Visit (INDEPENDENT_AMBULATORY_CARE_PROVIDER_SITE_OTHER): Payer: Medicare Other | Admitting: Adult Health

## 2017-09-06 VITALS — BP 128/76 | HR 80 | Ht 66.0 in | Wt 139.6 lb

## 2017-09-06 DIAGNOSIS — J45991 Cough variant asthma: Secondary | ICD-10-CM

## 2017-09-06 DIAGNOSIS — R918 Other nonspecific abnormal finding of lung field: Secondary | ICD-10-CM | POA: Diagnosis not present

## 2017-09-06 DIAGNOSIS — R05 Cough: Secondary | ICD-10-CM

## 2017-09-06 DIAGNOSIS — J45909 Unspecified asthma, uncomplicated: Secondary | ICD-10-CM | POA: Diagnosis not present

## 2017-09-06 DIAGNOSIS — R059 Cough, unspecified: Secondary | ICD-10-CM

## 2017-09-06 MED ORDER — METHYLPREDNISOLONE 4 MG PO TABS: ORAL_TABLET | ORAL | 0 refills | Status: DC

## 2017-09-06 MED ORDER — ALBUTEROL SULFATE (2.5 MG/3ML) 0.083% IN NEBU: 2.5000 mg | INHALATION_SOLUTION | RESPIRATORY_TRACT | 0 refills | Status: DC | PRN

## 2017-09-06 MED ORDER — LEVALBUTEROL HCL 0.63 MG/3ML IN NEBU
0.6300 mg | INHALATION_SOLUTION | Freq: Once | RESPIRATORY_TRACT | Status: AC
  Administered 2017-09-06: 0.63 mg via RESPIRATORY_TRACT

## 2017-09-06 NOTE — Addendum Note (Signed)
Addended by: Parke Poisson E on: 09/06/2017 03:35 PM   Modules accepted: Orders

## 2017-09-06 NOTE — Assessment & Plan Note (Signed)
Mild flare  xopenex neb given in office  Check cxr today   Plan  Patient Instructions  Increase Medrol 12mg  daily for 3 days then 8mg  daily for 3 days and 4mg  daily for 3 days then back to alternating 4mg  and 2 mg daily  Mucinex DM Twice daily  As needed  Cough/congestion  Chest xray today .  Follow up Dr. Melvyn Novas  In 6 weeks and As needed   Follow up with Rheumatology this week as planned .  Please contact office for sooner follow up if symptoms do not improve or worsen or seek emergency care

## 2017-09-06 NOTE — Progress Notes (Signed)
Chart and office note reviewed in detail  > agree with a/p as outlined    

## 2017-09-06 NOTE — Patient Instructions (Addendum)
Increase Medrol 12mg  daily for 3 days then 8mg  daily for 3 days and 4mg  daily for 3 days then back to alternating 4mg  and 2 mg daily  Mucinex DM Twice daily  As needed  Cough/congestion  Chest xray today .  Follow up Dr. Melvyn Novas  In 6 weeks and As needed   Follow up with Rheumatology this week as planned .  Please contact office for sooner follow up if symptoms do not improve or worsen or seek emergency care

## 2017-09-06 NOTE — Assessment & Plan Note (Signed)
Check cxr today  Plan  Patient Instructions  Increase Medrol 12mg  daily for 3 days then 8mg  daily for 3 days and 4mg  daily for 3 days then back to alternating 4mg  and 2 mg daily  Mucinex DM Twice daily  As needed  Cough/congestion  Chest xray today .  Follow up Dr. Melvyn Novas  In 6 weeks and As needed   Follow up with Rheumatology this week as planned .  Please contact office for sooner follow up if symptoms do not improve or worsen or seek emergency care

## 2017-09-06 NOTE — Progress Notes (Signed)
@Patient  ID: Stacey [name], female    DOB: [birth year], 77 y.o.   MRN: [id number]  Chief Complaint  Patient presents with  . Acute Visit    cough     Referring provider: Shon Baton, MD  HPI: 77 year old female never smoker followed for Asthma and Allergic rhinitis and rheumatoid lung disease  Medical history is significant for rheumatoid arthritis on Orencia -chronic steroids    09/06/2017 Acute OV : Cough  Pt complains of 2 weeks of cough , thick clear congestion . Occasionally has  green mucus.  No fever. She denies hemoptysis , chest pain, orthopnea, edema.  Recently treated for infected olecranon bursitis and foot infection .  She just finished 2 weeks of Clindamycin and 7 days of Doxycycline .  She is on Orencia , plans for treatment this week.   Allergies  Allergen Reactions  . Penicillins     unknown  . Remicade [Infliximab]   . Sulfa Antibiotics     Unknown reaction    Immunization History  Administered Date(s) Administered  . Influenza Split 04/12/2013, 04/12/2014, 03/14/2015, 04/23/2016  . Influenza, High Dose Seasonal PF 04/12/2017  . Pneumococcal Conjugate-13 09/28/2014  . Pneumococcal Polysaccharide-23 07/13/2012    Past Medical History:  Diagnosis Date  . Abnormal finding of blood chemistry   . Asthma   . H/O measles   . H/O varicella   . Hypertension   . 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 Lichen sclerosus 49/82/64   Asymptomatic  . Low iron   . Mitral valve prolapse   . Osteoarthritis   . Osteoporosis   . Post herpetic neuralgia   . Rheumatoid arthritis(714.0)   . Yeast infection     Tobacco History: Social History   Tobacco Use  Smoking Status Never Smoker  Smokeless Tobacco Never Used   Counseling given: Not Answered   Outpatient Encounter Medications as of 09/06/2017  Medication Sig  . abatacept (ORENCIA) 250 MG injection Inject into the vein every 30 (thirty) days.   Marland Kitchen albuterol (PROVENTIL) (2.5 MG/3ML) 0.083% nebulizer  solution Take 2.5 mg by nebulization every 4 (four) hours as needed for wheezing or shortness of breath (with flutter).  Marland Kitchen amitriptyline (ELAVIL) 25 MG tablet Take 25 mg by mouth at bedtime.  . beta carotene w/minerals (OCUVITE) tablet Take 1 tablet by mouth daily.  . cholecalciferol (VITAMIN D) 1000 UNITS tablet Take 1,000 Units by mouth daily.  Marland Kitchen gabapentin (NEURONTIN) 300 MG capsule 1 in am and 2 in pm  . losartan (COZAAR) 25 MG tablet Take 25 mg by mouth daily.  . methylPREDNISolone (MEDROL) 4 MG tablet Alternate 4mg  and 2mg  every other day  . pantoprazole (PROTONIX) 40 MG tablet TAKE 1 TABLET 30-60 MINUTES BEFORE THE 1ST MEAL OF THE DAY.  . ranitidine (ZANTAC) 150 MG capsule Take 150 mg by mouth every evening.  Marland Kitchen Respiratory Therapy Supplies (FLUTTER) DEVI Use as directed  . venlafaxine XR (EFFEXOR-XR) 75 MG 24 hr capsule Take 75 mg by mouth at bedtime.   . [EXPIRED] levalbuterol (XOPENEX) nebulizer solution 0.63 mg    No facility-administered encounter medications on file as of 09/06/2017.      Review of Systems  Constitutional:   No  weight loss, night sweats,  Fevers, chills, + fatigue, or  lassitude.  HEENT:   No headaches,  Difficulty swallowing,  Tooth/dental problems, or  Sore throat,                No sneezing, itching, ear  ache, nasal congestion, post nasal drip,   CV:  No chest pain,  Orthopnea, PND, swelling in lower extremities, anasarca, dizziness, palpitations, syncope.   GI  No heartburn, indigestion, abdominal pain, nausea, vomiting, diarrhea, change in bowel habits, loss of appetite, bloody stools.   Resp:    No chest wall deformity  Skin: no rash or lesions.  GU: no dysuria, change in color of urine, no urgency or frequency.  No flank pain, no hematuria   MS:  No joint pain or swelling.  No decreased range of motion.  No back pain.    Physical Exam  BP 128/76 (BP Location: Left Arm, Cuff Size: Normal)   Pulse 80   Ht 5\' 6"  (1.676 m)   Wt 139 lb 9.6 oz  (63.3 kg)   SpO2 97%   BMI 22.53 kg/m   GEN: A/Ox3; pleasant , NAD, elderly and frail    HEENT:  [place]/AT,  EACs-clear, TMs-wnl, NOSE-clear, THROAT-clear, no lesions, no postnasal drip or exudate noted.   NECK:  Supple w/ fair ROM; no JVD; normal carotid impulses w/o bruits; no thyromegaly or nodules palpated; no lymphadenopathy.    RESP  Few trace rhonchi ,  no accessory muscle use, no dullness to percussion  CARD:  RRR, no m/r/g, no peripheral edema, pulses intact, no cyanosis or clubbing.  GI:   Soft & nt; nml bowel sounds; no organomegaly or masses detected.   Musco: Warm bil, no deformities or joint swelling noted.   Neuro: alert, no focal deficits noted.    Skin: Warm, no lesions or rashes    Lab Results:  BMET  BNP No results found for: BNP  ProBNP No results found for: PROBNP  Imaging: US Venous Img Lower Unilateral Right  Result Date: 08/09/2017 CLINICAL DATA:  Pain and swelling in RIGHT foot, history of rheumatoid arthritis EXAM: RIGHT LOWER EXTREMITY VENOUS DOPPLER ULTRASOUND TECHNIQUE: Gray-scale sonography with graded compression, as well as color Doppler and duplex ultrasound were performed to evaluate the lower extremity deep venous systems from the level of the common femoral vein and including the common femoral, femoral, profunda femoral, popliteal and calf veins including the posterior tibial, peroneal and gastrocnemius veins when visible. The superficial great saphenous vein was also interrogated. Spectral Doppler was utilized to evaluate flow at rest and with distal augmentation maneuvers in the common femoral, femoral and popliteal veins. COMPARISON:  08/03/2012 FINDINGS: Contralateral Common Femoral Vein: Respiratory phasicity is normal and symmetric with the symptomatic side. No evidence of thrombus. Normal compressibility. Common Femoral Vein: No evidence of thrombus. Normal compressibility, respiratory phasicity and response to augmentation. Saphenofemoral  Junction: No evidence of thrombus. Normal compressibility and flow on color Doppler imaging. Profunda Femoral Vein: No evidence of thrombus. Normal compressibility and flow on color Doppler imaging. Femoral Vein: No evidence of thrombus. Normal compressibility, respiratory phasicity and response to augmentation. Popliteal Vein: No evidence of thrombus. Normal compressibility, respiratory phasicity and response to augmentation. Calf Veins: No evidence of thrombus. Normal compressibility and flow on color Doppler imaging. Superficial Great Saphenous Vein: No evidence of thrombus. Normal compressibility. Venous Reflux:  None. Other Findings:  None. IMPRESSION: No evidence of deep venous thrombosis in the RIGHT lower extremity. Electronically Signed   By: Lavonia Dana M.D.   On: 08/09/2017 15:11     Assessment & Plan:   Cough variant asthma vs uacs  Mild flare  xopenex neb given in office  Check cxr today   Plan  Patient Instructions  Increase Medrol 12mg   daily for 3 days then 8mg  daily for 3 days and 4mg  daily for 3 days then back to alternating 4mg  and 2 mg daily  Mucinex DM Twice daily  As needed  Cough/congestion  Chest xray today .  Follow up Dr. Melvyn Novas  In 6 weeks and As needed   Follow up with Rheumatology this week as planned .  Please contact office for sooner follow up if symptoms do not improve or worsen or seek emergency care       Multiple pulmonary nodules/RA lung dz  Check cxr today  Plan  Patient Instructions  Increase Medrol 12mg  daily for 3 days then 8mg  daily for 3 days and 4mg  daily for 3 days then back to alternating 4mg  and 2 mg daily  Mucinex DM Twice daily  As needed  Cough/congestion  Chest xray today .  Follow up Dr. Melvyn Novas  In 6 weeks and As needed   Follow up with Rheumatology this week as planned .  Please contact office for sooner follow up if symptoms do not improve or worsen or seek emergency care          Rexene Edison, NP 09/06/2017

## 2017-09-07 ENCOUNTER — Telehealth: Payer: Self-pay | Admitting: Internal Medicine

## 2017-09-07 NOTE — Telephone Encounter (Signed)
Talked with pt , advised cxr ok w/ chronic changes

## 2017-09-07 NOTE — Telephone Encounter (Signed)
Spoke with patient's husband Stacey [name]. He is requesting the results of patient's chest xray. He is concerned about the patient is scheduled to have a procedure done at [facility] tomorrow and the results may cause the procedure to be postponed.   TP, please advise. Thanks!

## 2017-09-08 NOTE — Progress Notes (Signed)
TP talked with patient personally

## 2017-09-14 DIAGNOSIS — M069 Rheumatoid arthritis, unspecified: Secondary | ICD-10-CM | POA: Diagnosis not present

## 2017-10-12 DIAGNOSIS — Z79899 Other long term (current) drug therapy: Secondary | ICD-10-CM | POA: Diagnosis not present

## 2017-10-12 DIAGNOSIS — M069 Rheumatoid arthritis, unspecified: Secondary | ICD-10-CM | POA: Diagnosis not present

## 2017-10-19 ENCOUNTER — Ambulatory Visit (INDEPENDENT_AMBULATORY_CARE_PROVIDER_SITE_OTHER): Payer: Medicare Other | Admitting: Internal Medicine

## 2017-10-19 ENCOUNTER — Encounter: Payer: Self-pay | Admitting: Internal Medicine

## 2017-10-19 VITALS — BP 140/70 | HR 76 | Ht 66.0 in | Wt 140.0 lb

## 2017-10-19 DIAGNOSIS — R918 Other nonspecific abnormal finding of lung field: Secondary | ICD-10-CM | POA: Diagnosis not present

## 2017-10-19 DIAGNOSIS — J45991 Cough variant asthma: Secondary | ICD-10-CM

## 2017-10-19 MED ORDER — GABAPENTIN 100 MG PO CAPS: ORAL_CAPSULE | ORAL | 2 refills | Status: DC

## 2017-10-19 NOTE — Patient Instructions (Addendum)
Start back on gabapentin up to 300 mg each am  in addition to the the two at bedtime   If not better increase the medrol to 8 mg daily until bettter then taper back to where you are   Keep your follow up appt.

## 2017-10-19 NOTE — Progress Notes (Signed)
Subjective:   Patient ID: Stacey [name], female    DOB: [birth year]     MRN: [id number]    Brief patient profile:  57 yowf  never smoker with allergies/inhalers as child outgrew by [employer] then  RA since around 2000  Prednisone  x decades and prev eval by Dr Joya Gaskins around 2004 for sob resolved s maint rx and referred 05/26/2013 by Dr Shelia Media for bronchitis and abn cxr   History of Present Illness  05/26/2013 1st [facility] office visit/ Yamato Kopf cc June 2014 dx pna  In [place] and remicade stopped and 100% better and placed arencia in September 2014  then abruptly worse first week in November with cough green sputum s nasal symptoms, fever low grade and no cp or cough and completely recovered prior to [facility] does not recall abx but issue is why keeps getting sick and abn CT Chest (see below).   Arthritis symptoms well controlled at present on Rx for RA rec Nexium 40 mg Take 30-60 min before first meal of the day and add pepcid 20 mg one at bedtime whenever coughing.   07/17/2013 f/u ov/Kalani Sthilaire re:  RA/ recurrent cough / pred at 4 mg per Linton Flemings plus orencia Chief Complaint  Patient presents with  . Followup with PFT    Pt states she is doing well and denies any co's today.   no longer sign cough. rec Nexium 40 mg Take 30-60 min before first meal of the day and add pepcid 20 mg one at bedtime whenever coughing.      04/18/2015  f/u ov/Deborh Pense re: mpns / RA / chronic cough  Chief Complaint  Patient presents with  . Follow-up    Pt states Dr Shelia Media wanted her to f/u on MPN.  She c/o occ cough- non prod.   daily cough/ sporadic/ not noct mostly dry   No better on nexium/zantac and no change x one week on zantac/zantac   rec Neurontin 300 mg with bfast to see if helps the daytime > did not do this but cough resolved anyway attributes to [place] candy GERD  Diet     02/07/2016  f/u ov/Mateen Franssen re:  RA/ mpns/ uacs on zantac 150 bid / no resp rx  Chief Complaint  Patient presents with  .  Follow-up    36mo rov. PFT results. breathing is baseline. no new conerns today.   minimal cough controlled with jolly ranchers / Not limited by breathing from desired activities   rec f/u prn    09/25/2016 acute extended ov/Mairlyn Tegtmeyer referred back by Dr Shelia Media re:  RA/ MPNS on zantac 150 am/ hs on maint medrol  Chief Complaint  Patient presents with  . Acute Visit    cough    acutely started coughing mid Feb 2018  eval by PA Dx copd rx abx/prednisone taper then back on medrol > much better  Same symptoms 09/11/16 assoc slt running nose> watery  assoc sense of wheezing 24/7  eval by NP  Dx copd / allergy/ asthma > rx zyrtec and BREO and neb > no better  Seen 09/23/15 with cxr  With minimal acute on chronic changes diffusely esp LUL  > no change rx but stopped BREO  Now cough better off BREO / still bothers intermittently during the day but does not disturb sleep and denies  excess/ purulent sputum or mucus plugs   rec Try prilosec otc 40mg   Take 30-60 min before first meal of the day and zantac 150  mg after supper and  one @  bedtime until cough is completely gone for at least a week without the need for cough suppression For cough mucinex dm 1200 mg every 12 hours as needed  Prednisone 10 mg take  4 each am x 2 days,   2 each am x 2 days,  1 each am x 2 days and stop  Start symbicort 80 Take 2 puffs first thing in am and then another 2 puffs about 12 hours later and stop after 2 weeks  Please schedule a follow up office visit in 4 weeks, sooner if needed with cxr on return     10/22/2016  f/u ov/Rainn Bullinger re:  RA/ MPNs/ Medrol 4mg  daily  Chief Complaint  Patient presents with  . Follow-up    cough unchanged, cough mostly nonprod cough with clear mucus.    refilled symbicort though did not seem to work so continued it until saw Dr Shelia Media and he replaced it with albuterol and seemed better p that in terms of cough which continues mostly dry and day > noct/ better p short course prednisone then flared  again ? While still on symbicort rec Stop the prilosec and change to pantoprazole (protonix) 40 mg   Take  30-60 min before first meal of the day and  Zantac 300  mg one @  bedtime until return to office - this is the best way to tell whether stomach acid is contributing to your problem.   GERD diet If coughing > use the  flutter valve as much as you can  Change gabapentin to 300 mg in am and 600 mg in pm  Double the medrol to take 2 each am  Ok to use to be nebulizer up to 4 hours if needed     11/10/16  NP f/u ov rec Continue on current regimen Decrease Medrol 6mg  daily for 1 week then begin 4mg  daily .  Follow med calendar closely and bring to each visit   09/06/17 NP eval for cough since  Mid Feb 2019 not resolved p abx rx x 2 cycles rec Increase Medrol 12mg  daily for 3 days then 8mg  daily for 3 days and 4mg  daily for 3 days then back to alternating 4mg  and 2 mg daily  Mucinex DM Twice daily  As needed  Cough/congestion     10/19/2017  f/u ov/Maricel Swartzendruber re:  RA lung dz  Chief Complaint  Patient presents with  . Follow-up    Cough is much improved, but has not resolved yet. Cough is non prod. She has not had to use her neb.   Dyspnea:  Not limited by breathing from desired activities  But some doe x steps Cough: daytime > noct dry  Sleep: fine  SABA use:  No saba  Medrol 4 mg a/w 2mg  per day/ ok control of arthritis   No obvious other patterns in  day to day or daytime variability or assoc excess/ purulent sputum or mucus plugs or hemoptysis or cp or chest tightness, subjective wheeze or overt sinus or hb symptoms. No unusual exposure hx or h/o childhood pna/ asthma or knowledge of premature birth.  Sleeping  ok  without nocturnal  or early am exacerbation  of respiratory  c/o's or need for noct saba. Also denies any obvious fluctuation of symptoms with weather or environmental changes or other aggravating or alleviating factors except as outlined above   Current Allergies, Complete  Past Medical History, Past Surgical History, Family History, and  Social History were reviewed in [employer] record.  ROS  The following are not active complaints unless bolded Hoarseness, sore throat, dysphagia, dental problems, itching, sneezing,  nasal congestion or discharge of excess mucus or purulent secretions, ear ache,   fever, chills, sweats, unintended wt loss or wt gain, classically pleuritic or exertional cp,  orthopnea pnd or arm/hand swelling  or leg swelling, presyncope, palpitations, abdominal pain, anorexia, nausea, vomiting, diarrhea  or change in bowel habits or change in bladder habits, change in stools or change in urine, dysuria, hematuria,  rash, arthralgias, visual complaints, headache, numbness, weakness or ataxia or problems with walking or coordination,  change in mood or  memory.        Current Meds  Medication Sig  . abatacept (ORENCIA) 250 MG injection Inject into the vein every 30 (thirty) days.   Marland Kitchen albuterol (PROVENTIL) (2.5 MG/3ML) 0.083% nebulizer solution Take 3 mLs (2.5 mg total) by nebulization every 4 (four) hours as needed for wheezing or shortness of breath (with flutter  (dx: J45.991)).  Marland Kitchen amitriptyline (ELAVIL) 25 MG tablet Take 25 mg by mouth at bedtime.  . beta carotene w/minerals (OCUVITE) tablet Take 1 tablet by mouth daily.  . cholecalciferol (VITAMIN D) 1000 UNITS tablet Take 1,000 Units by mouth daily.  Marland Kitchen gabapentin (NEURONTIN) 300 MG capsule 1 in am and 2 in pm  . losartan (COZAAR) 25 MG tablet Take 25 mg by mouth daily.  . methylPREDNISolone (MEDROL) 4 MG tablet Alternate 4mg  and 2mg  every other day  . pantoprazole (PROTONIX) 40 MG tablet TAKE 1 TABLET 30-60 MINUTES BEFORE THE 1ST MEAL OF THE DAY.  . ranitidine (ZANTAC) 150 MG capsule Take 150 mg by mouth every evening.  Marland Kitchen Respiratory Therapy Supplies (FLUTTER) DEVI Use as directed  . venlafaxine XR (EFFEXOR-XR) 75 MG 24 hr capsule Take 75 mg by mouth at bedtime.                                  Objective:   Physical Exam  amb wf nad   Vital signs reviewed - Note on arrival 02 sats  95% on RA    07/17/2013  146  > 09/28/2014   142 > 04/18/2015  145 > 11/01/2015 143 > 02/07/2016  143 > 09/25/2016  139 > 10/22/2016  140 >  01/11/2017 146 > 10/19/2017  140     05/26/13 147 lb (66.679 kg)  01/13/12 145 lb (65.772 kg)      HEENT: nl dentition, turbinates bilaterally, and oropharynx. Nl external ear canals without cough reflex   NECK :  without JVD/Nodes/TM/ nl carotid upstrokes bilaterally   LUNGS: no acc muscle use,  Nl contour chest Scattered insp pops/ squeaks, very min exp rhonchi bilaterally without cough on insp or exp maneuvers   CV:  RRR  no s3 or murmur or increase in P2, and no edema   ABD:  soft and nontender with nl inspiratory excursion in the supine position. No bruits or organomegaly appreciated, bowel sounds nl  MS:  Nl gait/ ext warm without deformities, calf tenderness, cyanosis or clubbing Scattered insp pops/ squeaks, min exp rhonchi bilaterally   SKIN: warm and dry without lesions    NEURO:  alert, approp, nl sensorium with  no motor or cerebellar deficits apparent.        I personally reviewed images and agree with radiology impression as follows:  CXR:  09/06/17 Changes of COPD and chronic interstitial lung disease.       Assessment & Plan:

## 2017-10-20 ENCOUNTER — Encounter: Payer: Self-pay | Admitting: Internal Medicine

## 2017-10-20 DIAGNOSIS — H532 Diplopia: Secondary | ICD-10-CM | POA: Diagnosis not present

## 2017-10-20 DIAGNOSIS — H5005 Alternating esotropia: Secondary | ICD-10-CM | POA: Diagnosis not present

## 2017-10-20 DIAGNOSIS — H5022 Vertical strabismus, left eye: Secondary | ICD-10-CM | POA: Diagnosis not present

## 2017-10-20 NOTE — Assessment & Plan Note (Signed)
-   See CT 05/16/13  1. The suspected pulmonary nodule in the right lung apex is actually  a calcified posterior pleural plaque and is benign in appearance.  2. Nodularity in the lateral aspect of the right upper lobe is felt  to be inflammatory in origin. However, there is a 9 mm nodule in  this area.  - PFT's 07/17/13 VC 74% with no obst and dlco 69 correctrs to 85% - CT 10/16/13 1. Stable chronic lung disease with scattered subpleural scarring and asymmetric right upper lobe subpleural nodularity, likely postinflammatory (prior granulomatous disease). 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 2. No suspicious pulmonary nodule or acute process demonstrated. - CT 10/21/2015 Mild growth of irregular 1.0 cm subpleural left upper lobe pulmonary nodule since 10/16/2013, cannot exclude an indolent primary bronchogenic carcinoma.  -  PET 11/01/2015 > neg with ild c/w RA - PFT's  02/07/2016  VC  1.99 (65%)   study with DLCO  67/68 % corrects to 89 % for alv volume   - HRCT rec p 10 days of levaquin 10/23/2016  - HRCT 11/06/2016  1. The appearance of the chest remains compatible with interstitial lung disease. 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 No significant progression of findings compared to the prior study. 2. Chronic areas of pleural-parenchymal thickening/calcification again noted throughout the apical portions of the lungs bilaterally, most compatible with areas of chronic post infectious or inflammatory scarring. - PFT's  01/11/2017   VC 1.91 (64%) s obst or curvature   p nothing prior to study with DLCO  61/59c % corrects to 74  % for alv volume     She seemed to improve on the higher doses of prednisone except for persistent cough may very well be related to the upper airway and not to her lung disease at all. In the event that she does not improve with treatment of the upper airway I have asked her to  double the dose of medrol to max of 8 mg and then working back down to her lower using the principal at the lowest dose that works is the right dose.

## 2017-10-20 NOTE — Assessment & Plan Note (Signed)
-   04/18/15 rec max rx for gerd > resolve - recurred Feb 2018 > max gerd rx added 09/25/2016  - 09/25/2016  After extensive coaching HFA effectiveness =    75% so try symbicort 80 2bid x one sample only  - improved on higher doses of pred and saba by report 10/22/2016 so rec medrol 4 mg double dose until better then resume previous dose  - flutter valve teaching done 10/22/2016   Her present cough is more suggestive of upper airway cough and asthma and therefore I recommended she try adding gabapentin 100 mg in the morning for a week and if not better she can work up to as much as 300 mg in the morning to supplement the evening dose of gabapentin while continuing maximum treatment directed at reflux and then return here if not better.   I had an extended discussion with the patient reviewing all relevant studies completed to date and  lasting 15 to 20 minutes of a 25 minute visit    Each maintenance medication was reviewed in detail including most importantly the difference between maintenance and prns and under what circumstances the prns are to be triggered using an action plan format that is not reflected in the computer generated alphabetically organized AVS.    Please see AVS for specific instructions unique to this visit that I personally wrote and verbalized to the the pt in detail and then reviewed with pt  by my nurse highlighting any  changes in therapy recommended at today's visit to their plan of care.

## 2017-11-09 DIAGNOSIS — M069 Rheumatoid arthritis, unspecified: Secondary | ICD-10-CM | POA: Diagnosis not present

## 2017-11-15 DIAGNOSIS — R5383 Other fatigue: Secondary | ICD-10-CM | POA: Diagnosis not present

## 2017-11-15 DIAGNOSIS — M0579 Rheumatoid arthritis with rheumatoid factor of multiple sites without organ or systems involvement: Secondary | ICD-10-CM | POA: Diagnosis not present

## 2017-11-15 DIAGNOSIS — R0609 Other forms of dyspnea: Secondary | ICD-10-CM | POA: Diagnosis not present

## 2017-12-08 DIAGNOSIS — R5383 Other fatigue: Secondary | ICD-10-CM | POA: Diagnosis not present

## 2017-12-08 DIAGNOSIS — R0609 Other forms of dyspnea: Secondary | ICD-10-CM | POA: Diagnosis not present

## 2017-12-08 DIAGNOSIS — R931 Abnormal findings on diagnostic imaging of heart and coronary circulation: Secondary | ICD-10-CM | POA: Diagnosis not present

## 2017-12-10 ENCOUNTER — Other Ambulatory Visit: Payer: Self-pay

## 2017-12-10 ENCOUNTER — Encounter (HOSPITAL_COMMUNITY): Payer: Self-pay

## 2017-12-10 ENCOUNTER — Emergency Department (HOSPITAL_COMMUNITY)
Admission: EM | Admit: 2017-12-10 | Discharge: 2017-12-10 | Disposition: A | Discharge status: Home / Self Care | Payer: Medicare Other | Attending: Emergency Medicine | Admitting: Emergency Medicine

## 2017-12-10 DIAGNOSIS — L03115 Cellulitis of right lower limb: Secondary | ICD-10-CM | POA: Insufficient documentation

## 2017-12-10 DIAGNOSIS — R600 Localized edema: Secondary | ICD-10-CM | POA: Diagnosis present

## 2017-12-10 DIAGNOSIS — Z79899 Other long term (current) drug therapy: Secondary | ICD-10-CM | POA: Diagnosis not present

## 2017-12-10 DIAGNOSIS — J45909 Unspecified asthma, uncomplicated: Secondary | ICD-10-CM | POA: Diagnosis not present

## 2017-12-10 DIAGNOSIS — Z23 Encounter for immunization: Secondary | ICD-10-CM | POA: Insufficient documentation

## 2017-12-10 MED ORDER — TETANUS-DIPHTH-ACELL PERTUSSIS 5-2.5-18.5 LF-MCG/0.5 IM SUSP
0.5000 mL | Freq: Once | INTRAMUSCULAR | Status: AC
  Administered 2017-12-10: 0.5 mL via INTRAMUSCULAR
  Filled 2017-12-10: qty 0.5

## 2017-12-10 MED ORDER — DOXYCYCLINE HYCLATE 100 MG PO CAPS: 100.0000 mg | ORAL_CAPSULE | Freq: Two times a day (BID) | ORAL | 0 refills | Status: DC

## 2017-12-10 NOTE — ED Notes (Signed)
ED Provider at bedside. 

## 2017-12-10 NOTE — ED Triage Notes (Signed)
Patient has a right calf puncture would from a stick. Patient states she was out of town when this happened and was placed on Keflex x 1 week, but patient concerned because it is not healing and redness remains.

## 2017-12-10 NOTE — ED Provider Notes (Signed)
[facility] DEPT Provider Note   CSN: [id number] Arrival date & time: 12/10/17  1531     History   Chief Complaint Chief Complaint  Patient presents with  . leg wound    HPI Stacey [name] is a 77 y.o. female.  77 yo F with a chief complaint of a leg wound.  The patient had a stick puncture her leg when she was on vacation in [place].  When she came back home she asked her neighbor who is a physician to look at it and he prescribed her a week's worth of Keflex for possible cellulitis.  Patient has been applying bacitracin to it and dry dressing.  Washing it with soap and water.  Since then she feels that it has not significantly changed.  Still has some erythema surrounding it no drainage no fevers.  She came in today to have it reevaluated.  She is unsure when her last tetanus was but thinks it was longer than 10 years ago.  She is on chronic steroids for rheumatoid arthritis.  The history is provided by the patient and the spouse.  Illness  This is a new problem. The current episode started more than 1 week ago. The problem occurs constantly. The problem has not changed since onset.Pertinent negatives include no chest pain, no headaches and no shortness of breath. Nothing aggravates the symptoms. Nothing relieves the symptoms. She has tried nothing for the symptoms. The treatment provided no relief.    Past Medical History:  Diagnosis Date  . Abnormal finding of blood chemistry   . Asthma   . H/O measles   . H/O varicella   . Hypertension   . 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 Lichen sclerosus 56/31/49   Asymptomatic  . Low iron   . Mitral valve prolapse   . Osteoarthritis   . Osteoporosis   . Post herpetic neuralgia   . Rheumatoid arthritis(714.0)   . Yeast infection     Patient Active Problem List   Diagnosis Date Noted  . 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 Multiple pulmonary nodules/RA lung dz  05/29/2013  . Cough variant asthma  vs uacs  05/29/2013  . Arthritis, rheumatoid ([place]) 01/13/2012  . Osteopenia 01/13/2012  . Lichen sclerosus et atrophicus of the vulva 01/13/2012  . Asthma 01/13/2012    Past Surgical History:  Procedure Laterality Date  . CHOLECYSTECTOMY  2011  . WISDOM TOOTH EXTRACTION       OB History    Gravida  2   Para  2   Term  2   Preterm      AB      Living  2     SAB      TAB      Ectopic      Multiple      Live Births  2            Home Medications    Prior to Admission medications   Medication Sig Start Date End Date Taking? Authorizing Provider  abatacept (ORENCIA) 250 MG injection Inject into the vein every 30 (thirty) days.     [provider]  albuterol (PROVENTIL) (2.5 MG/3ML) 0.083% nebulizer solution Take 3 mLs (2.5 mg total) by nebulization every 4 (four) hours as needed for wheezing or shortness of breath (with flutter  (dx: J45.991)). 09/06/17   Parrett, Fonnie Mu, NP  amitriptyline (ELAVIL) 25 MG tablet Take 25 mg by mouth at bedtime. 02/04/15  [provider]  beta carotene w/minerals (OCUVITE) tablet Take 1 tablet by mouth daily.    [provider]  cholecalciferol (VITAMIN D) 1000 UNITS tablet Take 1,000 Units by mouth daily.    [provider]  doxycycline (VIBRAMYCIN) 100 MG capsule Take 1 capsule (100 mg total) by mouth 2 (two) times daily. One po bid x 7 days 12/10/17   Deno Etienne, DO  gabapentin (NEURONTIN) 100 MG capsule One each am and if not better work up to 3 each am , stop when cough is gone for a week 10/19/17   Tanda Rockers, MD  gabapentin (NEURONTIN) 300 MG capsule 1 in am and 2 in pm 10/22/16   Tanda Rockers, MD  losartan (COZAAR) 25 MG tablet Take 25 mg by mouth daily. 02/04/15   [provider]  methylPREDNISolone (MEDROL) 4 MG tablet Alternate 4mg  and 2mg  every other day    [provider]  pantoprazole (PROTONIX) 40 MG tablet TAKE 1 TABLET 30-60 MINUTES BEFORE THE 1ST MEAL OF THE  DAY. 08/19/17   Tanda Rockers, MD  ranitidine (ZANTAC) 150 MG capsule Take 150 mg by mouth every evening.    [provider]  Respiratory Therapy Supplies (FLUTTER) DEVI Use as directed 10/22/16   Tanda Rockers, MD  venlafaxine XR (EFFEXOR-XR) 75 MG 24 hr capsule Take 75 mg by mouth at bedtime.     [provider]    Family History Family History  Problem Relation Age of Onset  . Asthma Mother   . Anemia Mother   . COPD Father   . Pulmonary fibrosis Father   . Breast cancer Maternal Grandmother 88    Social History Social History   Tobacco Use  . Smoking status: Never Smoker  . Smokeless tobacco: Never Used  Substance Use Topics  . Alcohol use: No  . Drug use: No     Allergies   Penicillins; Remicade [infliximab]; and Sulfa antibiotics   Review of Systems Review of Systems  Constitutional: Negative for chills and fever.  HENT: Negative for congestion and rhinorrhea.   Eyes: Negative for redness and visual disturbance.  Respiratory: Negative for shortness of breath and wheezing.   Cardiovascular: Negative for chest pain and palpitations.  Gastrointestinal: Negative for nausea and vomiting.  Genitourinary: Negative for dysuria and urgency.  Musculoskeletal: Negative for arthralgias and myalgias.  Skin: Positive for wound. Negative for pallor.  Neurological: Negative for dizziness and headaches.     Physical Exam Updated Vital Signs BP 138/80 (BP Location: Left Arm)   Pulse 70   Temp 97.9 F (36.6 C) (Oral)   Resp 18   Ht 5\' 6"  (1.676 m)   Wt 64.1 kg (141 lb 4.8 oz)   SpO2 100%   BMI 22.81 kg/m   Physical Exam  Constitutional: She is oriented to person, place, and time. She appears well-developed and well-nourished. No distress.  HENT:  Head: Normocephalic and atraumatic.  Eyes: Pupils are equal, round, and reactive to light. EOM are normal.  Neck: Normal range of motion. Neck supple.  Cardiovascular: Normal rate and regular rhythm.  Exam reveals no gallop and no friction rub.  No murmur heard. Pulmonary/Chest: Effort normal. She has no wheezes. She has no rales.  Abdominal: Soft. She exhibits no distension. There is no tenderness.  Musculoskeletal: She exhibits no edema or tenderness.  Neurological: She is alert and oriented to person, place, and time.  Skin: Skin is warm and dry. She is not diaphoretic.  Triangle shaped laceration to the right lateral lower leg.  Granulation tissue no drainage no induration fluctuance mild surrounding erythema  Psychiatric: She has a normal mood and affect. Her behavior is normal.  Nursing note and vitals reviewed.    ED Treatments / Results  Labs (all labs ordered are listed, but only abnormal results are displayed) Labs Reviewed - No data to display  EKG None  Radiology No results found.  Procedures Procedures (including critical care time)  Medications Ordered in ED Medications  Tdap (BOOSTRIX) injection 0.5 mL (has no administration in time range)     Initial Impression / Assessment and Plan / ED Course  I have reviewed the triage vital signs and the nursing notes.  Pertinent labs & imaging results that were available during my care of the patient were reviewed by me and considered in my medical decision making (see chart for details).     77 yo F with a chief complaint of a right leg wound.  This wound appears well, does not appear infected clinically.  I doubt that there is an underlying abscess.  There is mild surrounding erythema.  I discussed with the patient that this may be slow wound healing secondary to her steroid use.  Will prescribe doxycycline for possible infectious coverage.  We will have the patient follow-up with her PCP.  Update her tetanus.  Will be discharged home.  6:45 PM:  I have discussed the diagnosis/risks/treatment options with the patient and family and believe the pt to be eligible for discharge home to follow-up with PCP. We also  discussed returning to the ED immediately if new or worsening sx occur. We discussed the sx which are most concerning (e.g., sudden worsening pain, fever, inability to tolerate by mouth) that necessitate immediate return. Medications administered to the patient during their visit and any new prescriptions provided to the patient are listed below.  Medications given during this visit Medications  Tdap (BOOSTRIX) injection 0.5 mL (has no administration in time range)      The patient appears reasonably screen and/or stabilized for discharge and I doubt any other medical condition or other [facility] requiring further screening, evaluation, or treatment in the ED at this time prior to discharge.    Final Clinical Impressions(s) / ED Diagnoses   Final diagnoses:  Cellulitis of right lower extremity    ED Discharge Orders        Ordered    doxycycline (VIBRAMYCIN) 100 MG capsule  2 times daily     12/10/17 1834       Deno Etienne, DO 12/10/17 1845

## 2017-12-15 DIAGNOSIS — M2041 Other hammer toe(s) (acquired), right foot: Secondary | ICD-10-CM | POA: Diagnosis not present

## 2017-12-15 DIAGNOSIS — Z6822 Body mass index (BMI) 22.0-22.9, adult: Secondary | ICD-10-CM | POA: Diagnosis not present

## 2017-12-15 DIAGNOSIS — L03115 Cellulitis of right lower limb: Secondary | ICD-10-CM | POA: Diagnosis not present

## 2017-12-15 DIAGNOSIS — I872 Venous insufficiency (chronic) (peripheral): Secondary | ICD-10-CM | POA: Diagnosis not present

## 2017-12-15 DIAGNOSIS — S81809A Unspecified open wound, unspecified lower leg, initial encounter: Secondary | ICD-10-CM | POA: Diagnosis not present

## 2017-12-16 ENCOUNTER — Encounter (INDEPENDENT_AMBULATORY_CARE_PROVIDER_SITE_OTHER): Payer: Self-pay | Admitting: Orthopedic Surgery

## 2017-12-16 ENCOUNTER — Ambulatory Visit (INDEPENDENT_AMBULATORY_CARE_PROVIDER_SITE_OTHER): Payer: Medicare Other | Admitting: Orthopedic Surgery

## 2017-12-16 VITALS — Ht 66.0 in | Wt 141.0 lb

## 2017-12-16 DIAGNOSIS — M205X1 Other deformities of toe(s) (acquired), right foot: Secondary | ICD-10-CM | POA: Diagnosis not present

## 2017-12-16 DIAGNOSIS — I87331 Chronic venous hypertension (idiopathic) with ulcer and inflammation of right lower extremity: Secondary | ICD-10-CM

## 2017-12-16 DIAGNOSIS — L97919 Non-pressure chronic ulcer of unspecified part of right lower leg with unspecified severity: Secondary | ICD-10-CM | POA: Diagnosis not present

## 2017-12-16 NOTE — Progress Notes (Signed)
Office Visit Note   Patient: Stacey [name]           Date of Birth: 01/30/1941           MRN: [id number] Visit Date: 12/16/2017              Requested by: Shon Baton, MD [address] [place], [place] [address] PCP: Shon Baton, MD  Chief Complaint  Patient presents with  . Right Leg - Open Wound  . Right Foot - Pain    Hx right Chevron 1st MT Weil osteotomy 2nd, 3rd and 4th PIP resection and fusion 08/22/10      HPI: Patient is a 77 year old woman presents for 2 separate issues #1 she had a puncture wound to the right lower extremity from a stick.  She has been treated with Keflex this is changed to doxycycline she still has an open wound with redness around the ulcer.  Patient also complains of progressive clawing of the right foot third toe she is status post chevron osteotomy of the first metatarsal right foot with a Weil osteotomy of the second third and fourth toes with a PIP resection and fusion of the second toe.  Assessment & Plan: Visit Diagnoses:  1. Claw toe, acquired, right   2. 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  She will wear the stocking around the clock changing at the shower she will not put anything between the stocking in the wound.  She will complete her course of doxycycline.  Discussed that with the progressive clawing of the third toe we could proceed with a PIP resection and pinning of the third toe this should resolve her symptoms.  Patient states she would like to proceed with surgery in July.  Follow-Up Instructions: Return in about 1 month (around 01/13/2018).   Ortho Exam  Patient is alert, oriented, no adenopathy, well-dressed, normal affect, normal respiratory effort. Examination patient has a good dorsalis pedis pulse she has progressive clawing of the third  toe with an ulcer over the tip of the third toe.  After informed consent a 10 blade knife was used to debride the skin and soft tissue back to healthy viable tissue the ulcer is 10 mm in diameter 3 mm deep there is no cellulitis no signs of infection.  Patient has a traumatic ulcer with venous insufficiency right leg ulcer is 10 mm in diameter 1 mm deep there is good healthy granulation tissue with some mild redness around the edges less than 1 cm around the circumference of the wound.  There is no tenderness in the calf no ascending cellulitis no odor no drainage.  Imaging: No results found. No images are attached to the encounter.  Labs: Lab Results  Component Value Date   HGBA1C 5.9 (H) 08/20/2015     Lab Results  Component Value Date   ALBUMIN 3.6 05/05/2007    Body mass index is 22.76 kg/m.  Orders:  No orders of the defined types were placed in this encounter.  No orders of the defined types were placed in this encounter.    Procedures: No procedures performed  Clinical Data: No additional findings.  ROS:  All other systems negative, except as noted in the HPI. Review of Systems  Objective: Vital Signs: Ht 5\' 6"  (1.676 m)   Wt 141 lb (64  kg)   BMI 22.76 kg/m   Specialty Comments:  No specialty comments available.  PMFS History: Patient Active Problem List   Diagnosis Date Noted  . 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 Multiple pulmonary nodules/RA lung dz  05/29/2013  . Cough variant asthma vs uacs  05/29/2013  . Arthritis, rheumatoid ([place]) 01/13/2012  . Osteopenia 01/13/2012  . Lichen sclerosus et atrophicus of the vulva 01/13/2012  . Asthma 01/13/2012   Past Medical History:  Diagnosis Date  . Abnormal finding of blood chemistry   . Asthma   . H/O measles   . H/O varicella   . Hypertension   . 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 Lichen sclerosus 09/73/53   Asymptomatic  . Low iron   . Mitral valve  prolapse   . Osteoarthritis   . Osteoporosis   . Post herpetic neuralgia   . Rheumatoid arthritis(714.0)   . Yeast infection     Family History  Problem Relation Age of Onset  . Asthma Mother   . Anemia Mother   . COPD Father   . Pulmonary fibrosis Father   . Breast cancer Maternal Grandmother 88    Past Surgical History:  Procedure Laterality Date  . CHOLECYSTECTOMY  2011  . WISDOM TOOTH EXTRACTION     Social History   Occupational History  . Occupation: Retired  Tobacco Use  . Smoking status: Never Smoker  . Smokeless tobacco: Never Used  Substance and Sexual Activity  . Alcohol use: No  . Drug use: No  . Sexual activity: Yes

## 2017-12-23 DIAGNOSIS — M069 Rheumatoid arthritis, unspecified: Secondary | ICD-10-CM | POA: Diagnosis not present

## 2017-12-23 DIAGNOSIS — R0609 Other forms of dyspnea: Secondary | ICD-10-CM | POA: Diagnosis not present

## 2017-12-30 ENCOUNTER — Telehealth (INDEPENDENT_AMBULATORY_CARE_PROVIDER_SITE_OTHER): Payer: Self-pay | Admitting: Orthopedic Surgery

## 2017-12-30 NOTE — Telephone Encounter (Signed)
I called Stacey [name] to discuss the toe surgery that was mentioned at her 12/16/17 office visit with Dr. Sharol Given.  Her office note mentioned that she wanted it scheduled for some time in July.  Stacey [name] advised me that she would like to put that surgery on hold for now.  She will call if she decides to schedule.

## 2018-01-10 ENCOUNTER — Ambulatory Visit (INDEPENDENT_AMBULATORY_CARE_PROVIDER_SITE_OTHER): Payer: Medicare Other | Admitting: Internal Medicine

## 2018-01-10 ENCOUNTER — Encounter: Payer: Self-pay | Admitting: Internal Medicine

## 2018-01-10 ENCOUNTER — Ambulatory Visit (INDEPENDENT_AMBULATORY_CARE_PROVIDER_SITE_OTHER)
Admission: RE | Admit: 2018-01-10 | Discharge: 2018-01-10 | Disposition: A | Discharge status: Home / Self Care | Payer: Medicare Other | Source: Ambulatory Visit | Attending: Internal Medicine | Admitting: Internal Medicine

## 2018-01-10 VITALS — BP 122/60 | HR 86 | Ht 66.0 in | Wt 142.0 lb

## 2018-01-10 DIAGNOSIS — R918 Other nonspecific abnormal finding of lung field: Secondary | ICD-10-CM

## 2018-01-10 DIAGNOSIS — J984 Other disorders of lung: Secondary | ICD-10-CM | POA: Diagnosis not present

## 2018-01-10 LAB — PULMONARY FUNCTION TEST
DL/VA % pred: 81 %
DL/VA: 4.09 ml/min/mmHg/L
DLCO unc % pred: 59 %
DLCO unc: 16.09 ml/min/mmHg
FEF 25-75 Post: 1.4 L/sec
FEF 25-75 Pre: 1.12 L/sec
FEF2575-%Change-Post: 25 %
FEF2575-%Pred-Post: 86 %
FEF2575-%Pred-Pre: 68 %
FEV1-%Change-Post: 4 %
FEV1-%Pred-Post: 70 %
FEV1-%Pred-Pre: 67 %
FEV1-Post: 1.55 L
FEV1-Pre: 1.48 L
FEV1FVC-%Change-Post: 1 %
FEV1FVC-%Pred-Pre: 103 %
FEV6-%Change-Post: 3 %
FEV6-%Pred-Post: 71 %
FEV6-%Pred-Pre: 69 %
FEV6-Post: 1.99 L
FEV6-Pre: 1.93 L
FEV6FVC-%Pred-Post: 105 %
FEV6FVC-%Pred-Pre: 105 %
FVC-%Change-Post: 3 %
FVC-%Pred-Post: 67 %
FVC-%Pred-Pre: 65 %
FVC-Post: 1.99 L
FVC-Pre: 1.93 L
Post FEV1/FVC ratio: 78 %
Post FEV6/FVC ratio: 100 %
Pre FEV1/FVC ratio: 77 %
Pre FEV6/FVC Ratio: 100 %
RV % pred: 102 %
RV: 2.52 L
TLC % pred: 82 %
TLC: 4.4 L

## 2018-01-10 NOTE — Progress Notes (Signed)
PFT done today. 

## 2018-01-10 NOTE — Progress Notes (Addendum)
Subjective:   Patient ID: Stacey [name], female    DOB: 22-Apr-1941     MRN: [id number]    Brief patient profile:  42 yowf  never smoker with allergies/inhalers as child outgrew by [employer] then  RA since around 2000  Prednisone  x decades and prev eval by Dr Joya Gaskins around 2004 for sob resolved s maint rx and referred 05/26/2013 by Dr Shelia Media for bronchitis and abn cxr   History of Present Illness  05/26/2013 1st [facility] office visit/ Stacey [name] cc June 2014 dx pna  In [place] and remicade stopped and 100% better and placed arencia in September 2014  then abruptly worse first week in November with cough green sputum s nasal symptoms, fever low grade and no cp or cough and completely recovered prior to [place] does not recall abx but issue is why keeps getting sick and abn CT Chest (see below).   Arthritis symptoms well controlled at present on Rx for RA rec Nexium 40 mg Take 30-60 min before first meal of the day and add pepcid 20 mg one at bedtime whenever coughing.   07/17/2013 f/u ov/Stacey [name] re:  RA/ recurrent cough / pred at 4 mg per [facility] Flemings plus orencia Chief Complaint  Patient presents with  . Followup with PFT    Pt states she is doing well and denies any co's today.   no longer sign cough. rec Nexium 40 mg Take 30-60 min before first meal of the day and add pepcid 20 mg one at bedtime whenever coughing.            09/06/17 NP eval for cough since  Mid Feb 2019 not resolved p abx rx x 2 cycles rec Increase Medrol 12mg  daily for 3 days then 8mg  daily for 3 days and 4mg  daily for 3 days then back to alternating 4mg  and 2 mg daily  Mucinex DM Twice daily  As needed  Cough/congestion     10/19/2017  f/u ov/Stacey [name] re:  RA lung dz  Chief Complaint  Patient presents with  . Follow-up    Cough is much improved, but has not resolved yet. Cough is non prod. She has not had to use her neb.   Dyspnea:  Not limited by breathing from desired activities  But some doe x steps Cough:  daytime > noct dry  Sleep: fine  SABA use:  No saba Medrol 4 mg a/w 2mg  per day/ ok control of arthritis  rec Start back on gabapentin up to 300 mg each am  in addition to the the two at bedtime  If not better increase the medrol to 8 mg daily until bettter then taper back to where you      01/10/2018  f/u ov/Stacey [name] re:  RA  Lung dz Medrol  4 mg  One alternating with a half Chief Complaint  Patient presents with  . Follow-up    PFT's done. Her breathing has been gradually worsening since the last visit. She has occ cough- non prod.    Dyspnea gradually worse since last ov:    MMRC3 = can't walk 100 yards even at a slow pace at a flat grade s stopping due to sob    Gradually worse  x 3 m / more fatigue / no change in arthritis  Cough: not an issue  SABA use: not using  02: none    No obvious day to day or daytime variability or assoc excess/ purulent sputum or mucus plugs or hemoptysis  or cp or chest tightness, subjective wheeze or overt sinus or hb symptoms.   Sleeping: fine 1 pillow without nocturnal  or early am exacerbation  of respiratory  c/o's or need for noct saba. Also denies any obvious fluctuation of symptoms with weather or environmental changes or other aggravating or alleviating factors except as outlined above   No unusual exposure hx or h/o childhood pna  or knowledge of premature birth.  Current Allergies, Complete Past Medical History, Past Surgical History, Family History, and Social History were reviewed in [employer] record.  ROS  The following are not active complaints unless bolded Hoarseness, sore throat, dysphagia, dental problems, itching, sneezing,  nasal congestion or discharge of excess mucus or purulent secretions, ear ache,   fever, chills, sweats, unintended wt loss or wt gain, classically pleuritic or exertional cp,  orthopnea pnd or arm/hand swelling  or leg swelling, presyncope, palpitations, abdominal pain, anorexia, nausea,  vomiting, diarrhea  or change in bowel habits or change in bladder habits, change in stools or change in urine, dysuria, hematuria,  rash, arthralgias about the same as usual , visual complaints, headache, numbness, weakness or ataxia or problems with walking or coordination,  change in mood or  memory.        Current Meds  Medication Sig  . abatacept (ORENCIA) 250 MG injection Inject into the vein every 30 (thirty) days.   Marland Kitchen albuterol (PROVENTIL) (2.5 MG/3ML) 0.083% nebulizer solution Take 3 mLs (2.5 mg total) by nebulization every 4 (four) hours as needed for wheezing or shortness of breath (with flutter  (dx: J45.991)).  Marland Kitchen amitriptyline (ELAVIL) 25 MG tablet Take 25 mg by mouth at bedtime.  . beta carotene w/minerals (OCUVITE) tablet Take 1 tablet by mouth daily.  . cholecalciferol (VITAMIN D) 1000 UNITS tablet Take 1,000 Units by mouth daily.  Marland Kitchen gabapentin (NEURONTIN) 300 MG capsule Take 600 mg by mouth at bedtime.  Marland Kitchen losartan (COZAAR) 25 MG tablet Take 25 mg by mouth daily.  . methylPREDNISolone (MEDROL) 4 MG tablet Alternate 4mg  and 2mg  every other day  . ranitidine (ZANTAC) 150 MG capsule Take 150 mg by mouth every evening.  Marland Kitchen Respiratory Therapy Supplies (FLUTTER) DEVI Use as directed  . venlafaxine XR (EFFEXOR-XR) 75 MG 24 hr capsule Take 75 mg by mouth at bedtime.                                     Objective:   Physical Exam  amb  Thin wf nad    Vital signs reviewed - Note on arrival 02 sats  95% on RA    07/17/2013  146  > 09/28/2014   142 > 04/18/2015  145 > 11/01/2015 143 > 02/07/2016  143 > 09/25/2016  139 > 10/22/2016  140 >  01/11/2017 146 > 10/19/2017  140 >  01/10/2018  142     05/26/13 147 lb (66.679 kg)  01/13/12 145 lb (65.772 kg)       HEENT: nl dentition, turbinates bilaterally, and oropharynx. Nl external ear canals without cough reflex   NECK :  without JVD/Nodes/TM/ nl carotid upstrokes bilaterally   LUNGS: no acc muscle use,  Nl contour chest with  scattered insp pops/squeaks both bases/ no exp rhonchi  CV:  RRR  no s3 or murmur or increase in P2, and no edema   ABD:  soft and nontender with nl inspiratory excursion in  the supine position. No bruits or organomegaly appreciated, bowel sounds nl  MS:  Nl gait/ ext warm without deformities, calf tenderness, cyanosis or clubbing RA changes both hands   SKIN: warm and dry without lesions    NEURO:  alert, approp, nl sensorium with  no motor or cerebellar deficits apparent.         CXR PA and Lateral:   01/10/2018 :    I personally reviewed images and agree with radiology impression as follows:   Stable reticular densities are noted throughout both lungs most consistent with scarring or chronic interstitial lung disease. No acute abnormality is noted        Assessment & Plan:

## 2018-01-10 NOTE — Patient Instructions (Signed)
Protonix 40 mg Take 30-60 min before first meal of the day   GERD (REFLUX)  is an extremely common cause of respiratory symptoms just like yours , many times with no obvious heartburn at all.    It can be treated with medication, but also with lifestyle changes including elevation of the head of your bed (ideally with 6 inch  bed blocks),  Smoking cessation, avoidance of late meals, excessive alcohol, and avoid fatty foods, chocolate, peppermint, colas, red wine, and acidic juices such as orange juice.  NO MINT OR MENTHOL PRODUCTS SO NO COUGH DROPS   USE SUGARLESS CANDY INSTEAD (Jolley ranchers or Stover's or Life Savers) or even ice chips will also do - the key is to swallow to prevent all throat clearing. NO OIL BASED VITAMINS - use powdered substitutes.    Please remember to go to the  x-ray department downstairs in the basement  for your tests - we will call you with the results when they are available.

## 2018-01-11 ENCOUNTER — Encounter: Payer: Self-pay | Admitting: Internal Medicine

## 2018-01-11 DIAGNOSIS — J449 Chronic obstructive pulmonary disease, unspecified: Secondary | ICD-10-CM | POA: Diagnosis not present

## 2018-01-11 DIAGNOSIS — R2689 Other abnormalities of gait and mobility: Secondary | ICD-10-CM | POA: Diagnosis not present

## 2018-01-11 DIAGNOSIS — Z6822 Body mass index (BMI) 22.0-22.9, adult: Secondary | ICD-10-CM | POA: Diagnosis not present

## 2018-01-11 DIAGNOSIS — M0510 Rheumatoid lung disease with rheumatoid arthritis of unspecified site: Secondary | ICD-10-CM | POA: Diagnosis not present

## 2018-01-11 DIAGNOSIS — E7849 Other hyperlipidemia: Secondary | ICD-10-CM | POA: Diagnosis not present

## 2018-01-11 DIAGNOSIS — I872 Venous insufficiency (chronic) (peripheral): Secondary | ICD-10-CM | POA: Diagnosis not present

## 2018-01-11 DIAGNOSIS — I10 Essential (primary) hypertension: Secondary | ICD-10-CM | POA: Diagnosis not present

## 2018-01-11 DIAGNOSIS — D692 Other nonthrombocytopenic purpura: Secondary | ICD-10-CM | POA: Diagnosis not present

## 2018-01-11 DIAGNOSIS — R600 Localized edema: Secondary | ICD-10-CM | POA: Diagnosis not present

## 2018-01-11 DIAGNOSIS — M2041 Other hammer toe(s) (acquired), right foot: Secondary | ICD-10-CM | POA: Diagnosis not present

## 2018-01-11 DIAGNOSIS — D8989 Other specified disorders involving the immune mechanism, not elsewhere classified: Secondary | ICD-10-CM | POA: Diagnosis not present

## 2018-01-11 DIAGNOSIS — M069 Rheumatoid arthritis, unspecified: Secondary | ICD-10-CM | POA: Diagnosis not present

## 2018-01-11 NOTE — Progress Notes (Signed)
Left detailed msg ok per DPR

## 2018-01-11 NOTE — Assessment & Plan Note (Signed)
-   See CT 05/16/13  1. The suspected pulmonary nodule in the right lung apex is actually  a calcified posterior pleural plaque and is benign in appearance.  2. Nodularity in the lateral aspect of the right upper lobe is felt  to be inflammatory in origin. However, there is a 9 mm nodule in  this area.  - PFT's 07/17/13 VC 74% with no obst and dlco 69 correctrs to 85% - CT 10/16/13 1. Stable chronic lung disease with scattered subpleural scarring and asymmetric right upper lobe subpleural nodularity, likely postinflammatory (prior granulomatous disease). 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 2. No suspicious pulmonary nodule or acute process demonstrated. - CT 10/21/2015 Mild growth of irregular 1.0 cm subpleural left upper lobe pulmonary nodule since 10/16/2013, cannot exclude an indolent primary bronchogenic carcinoma.  -  PET 11/01/2015 > neg with ild c/w RA - PFT's  02/07/2016  VC  1.99 (65%)   study with DLCO  67/68 % corrects to 89 % for alv volume   - HRCT rec p 10 days of levaquin 10/23/2016  - HRCT 11/06/2016  1. The appearance of the chest remains compatible with interstitial lung disease. 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 No significant progression of findings compared to the prior study. 2. Chronic areas of pleural-parenchymal thickening/calcification again noted throughout the apical portions of the lungs bilaterally, most compatible with areas of chronic post infectious or inflammatory scarring. 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 and yet pt has declined in terms of ex tol which I strongly suspect is more  related to deconditioning and geriatric decline than to lung dz per se and I did not rec any need to increase the systemic steroids from my perspective but rather supported the strategy that the lowest dose that works is the right dose and deferred specifics to rheumatology  Discussed in detail all the  indications, usual  risks and alternatives  relative to the benefits with patient who agrees to proceed with conservative f/u as outlined   I had an extended discussion with the patient reviewing all relevant studies completed to date and  lasting 15 to 20 minutes of a 25 minute visit    Each maintenance medication was reviewed in detail including most importantly the difference between maintenance and prns and under what circumstances the prns are to be triggered using an action plan format that is not reflected in the computer generated alphabetically organized AVS.    Please see AVS for specific instructions unique to this visit that I personally wrote and verbalized to the the pt in detail and then reviewed with pt  by my nurse highlighting any  changes in therapy recommended at today's visit to their plan of care.

## 2018-01-12 ENCOUNTER — Ambulatory Visit (INDEPENDENT_AMBULATORY_CARE_PROVIDER_SITE_OTHER): Payer: Medicare Other | Admitting: Orthopedic Surgery

## 2018-01-12 ENCOUNTER — Encounter (INDEPENDENT_AMBULATORY_CARE_PROVIDER_SITE_OTHER): Payer: Self-pay | Admitting: Orthopedic Surgery

## 2018-01-12 DIAGNOSIS — M205X1 Other deformities of toe(s) (acquired), right foot: Secondary | ICD-10-CM

## 2018-01-12 DIAGNOSIS — L97919 Non-pressure chronic ulcer of unspecified part of right lower leg with unspecified severity: Secondary | ICD-10-CM

## 2018-01-12 DIAGNOSIS — I87331 Chronic venous hypertension (idiopathic) with ulcer and inflammation of right lower extremity: Secondary | ICD-10-CM

## 2018-01-12 NOTE — Progress Notes (Signed)
Office Visit Note   Patient: Stacey [name]           Date of Birth: 07-02-1941           MRN: [id number] Visit Date: 01/12/2018              Requested by: Shon Baton, MD [address] [place], [place] [address] PCP: Shon Baton, MD  Chief Complaint  Patient presents with  . Right Leg - Follow-up      HPI: Patient is a 77 year old woman who was treated in medical compression socks for a venous ulcer right leg.  She states the sock completely healed up her leg she is quite pleased.  She states she still has claw toe problems but they are not ulcerated or bothering her much at this time.  Assessment & Plan: Visit Diagnoses:  1. Claw toe, acquired, right   2. 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  Follow-Up Instructions: Return if symptoms worsen or fail to improve.   Ortho Exam  Patient is alert, oriented, no adenopathy, well-dressed, normal affect, normal respiratory effort. Examination patient venous stasis ulcer is completely healed in the right leg with no redness no cellulitis no drainage.  She does have fixed clawing of the lesser toes bilaterally but there are no open ulcers there is callus of the PIP joints.  Imaging: No results found. No images are attached to the encounter.  Labs: Lab Results  Component Value Date   HGBA1C 5.9 (H) 08/20/2015     Lab Results  Component Value Date   ALBUMIN 3.6 05/05/2007    There is no height or weight on file to calculate BMI.  Orders:  No orders of the defined types were placed in this encounter.  No orders of the defined types were placed in this encounter.    Procedures: No procedures performed  Clinical Data: No additional findings.  ROS:  All other  systems negative, except as noted in the HPI. Review of Systems  Objective: Vital Signs: There were no vitals taken for this visit.  Specialty Comments:  No specialty comments available.  PMFS History: Patient Active Problem List   Diagnosis Date Noted  . 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 Multiple pulmonary nodules/RA lung dz  05/29/2013  . Cough variant asthma vs uacs  05/29/2013  . Arthritis, rheumatoid ([place]) 01/13/2012  . Osteopenia 01/13/2012  . Lichen sclerosus et atrophicus of the vulva 01/13/2012  . Asthma 01/13/2012   Past Medical History:  Diagnosis Date  . Abnormal finding of blood chemistry   . Asthma   . H/O measles   . H/O varicella   . Hypertension   . 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 Lichen sclerosus 50/53/97   Asymptomatic  . Low iron   . Mitral valve prolapse   . Osteoarthritis   . Osteoporosis   . Post herpetic neuralgia   . Rheumatoid arthritis(714.0)   . Yeast infection     Family History  Problem Relation Age of Onset  . Asthma Mother   . Anemia Mother   . COPD Father   . Pulmonary fibrosis Father   . Breast cancer Maternal Grandmother 47  Past Surgical History:  Procedure Laterality Date  . CHOLECYSTECTOMY  2011  . WISDOM TOOTH EXTRACTION     Social History   Occupational History  . Occupation: Retired  Tobacco Use  . Smoking status: Never Smoker  . Smokeless tobacco: Never Used  Substance and Sexual Activity  . Alcohol use: No  . Drug use: No  . Sexual activity: Yes

## 2018-01-17 ENCOUNTER — Ambulatory Visit (INDEPENDENT_AMBULATORY_CARE_PROVIDER_SITE_OTHER): Payer: Medicare Other | Admitting: Internal Medicine

## 2018-01-17 ENCOUNTER — Encounter: Payer: Self-pay | Admitting: Internal Medicine

## 2018-01-17 VITALS — BP 126/80 | HR 80 | Temp 97.9°F | Ht 66.0 in | Wt 140.0 lb

## 2018-01-17 DIAGNOSIS — Z961 Presence of intraocular lens: Secondary | ICD-10-CM | POA: Diagnosis not present

## 2018-01-17 DIAGNOSIS — J45991 Cough variant asthma: Secondary | ICD-10-CM | POA: Diagnosis not present

## 2018-01-17 DIAGNOSIS — H532 Diplopia: Secondary | ICD-10-CM | POA: Diagnosis not present

## 2018-01-17 MED ORDER — DOXYCYCLINE HYCLATE 100 MG PO TABS: 100.0000 mg | ORAL_TABLET | Freq: Two times a day (BID) | ORAL | 0 refills | Status: DC

## 2018-01-17 NOTE — Progress Notes (Signed)
Subjective:   Patient ID: Stacey [name], female    DOB: 1941/06/01     MRN: [id number]    Brief patient profile:  79 yowf  never smoker with allergies/inhalers as child outgrew by [employer] then  RA since around 2000  Prednisone  x decades and prev eval by Dr Joya Gaskins around 2004 for sob resolved s maint rx and referred 05/26/2013 by Dr Shelia Media for bronchitis and abn cxr   History of Present Illness  05/26/2013 1st [facility] office visit/ Stacey [name] cc June 2014 dx pna  In [place] and remicade stopped and 100% better and placed arencia in September 2014  then abruptly worse first week in November with cough green sputum s nasal symptoms, fever low grade and no cp or cough and completely recovered prior to [place] does not recall abx but issue is why keeps getting sick and abn CT Chest (see below).   Arthritis symptoms well controlled at present on Rx for RA rec Nexium 40 mg Take 30-60 min before first meal of the day and add pepcid 20 mg one at bedtime whenever coughing.   07/17/2013 f/u ov/Stacey [name] re:  RA/ recurrent cough / pred at 4 mg per Linton Flemings plus orencia Chief Complaint  Patient presents with  . Followup with PFT    Pt states she is doing well and denies any co's today.   no longer sign cough. rec Nexium 40 mg Take 30-60 min before first meal of the day and add pepcid 20 mg one at bedtime whenever coughing.            09/06/17 NP eval for cough since  Mid Feb 2019 not resolved p abx rx x 2 cycles rec Increase Medrol 12mg  daily for 3 days then 8mg  daily for 3 days and 4mg  daily for 3 days then back to alternating 4mg  and 2 mg daily  Mucinex DM Twice daily  As needed  Cough/congestion     10/19/2017  f/u ov/Stacey [name] re:  RA lung dz  Chief Complaint  Patient presents with  . Follow-up    Cough is much improved, but has not resolved yet. Cough is non prod. She has not had to use her neb.   Dyspnea:  Not limited by breathing from desired activities  But some doe x steps Cough:  daytime > noct dry  Sleep: fine  SABA use:  No saba Medrol 4 mg a/w 2mg  per day/ ok control of arthritis  rec Start back on gabapentin up to 300 mg each am  in addition to the the two at bedtime  If not better increase the medrol to 8 mg daily until bettter then taper back to where you      01/10/2018  f/u ov/Stacey [name] re:  RA  Lung dz Medrol  4 mg  One alternating with a half Chief Complaint  Patient presents with  . Follow-up    PFT's done. Her breathing has been gradually worsening since the last visit. She has occ cough- non prod.   Dyspnea gradually worse since last ov:  MMRC1 =  MMRC3 = can't walk 100 yards even at a slow pace at a flat grade s stopping due to sob    Gradually x 3 m / more fatigue / no change in arthritis  Cough: not an issue rec Protonix 40 mg Take 30-60 min before first meal of the day  GERD diet   01/17/2018 acute extended ov/Stacey [name] re: cough on medrol 4 mg  One a/w  one half  Chief Complaint  Patient presents with  . Acute Visit    started coughing 01/11/18- occ prod with minimal green sputum.  She states also wheezing and having increased SOB.    abruptly worse 01/11/18 with severe 24/7 coughing >>  prod min green mucus esp in am/ assoc with subjective wheeze and did not follow previous contingencies re flutter / saba/ increase medrol and admits she does not rember those written instructions nor how to use the neb provided .  No fever/ comfortable at rest sitting    No obvious day to day or daytime variability or assoc  mucus plugs or hemoptysis or cp or chest tightness, or  overt sinus or hb symptoms.    Also denies any obvious fluctuation of symptoms with weather or environmental changes or other aggravating or alleviating factors except as outlined above   No unusual exposure hx or h/o childhood pna/ asthma or knowledge of premature birth.  Current Allergies, Complete Past Medical History, Past Surgical History, Family History, and Social History were reviewed in  [employer] record.  ROS  The following are not active complaints unless bolded Hoarseness, sore throat, dysphagia, dental problems, itching, sneezing,  nasal congestion or discharge of excess mucus or purulent secretions, ear ache,   fever, chills, sweats, unintended wt loss or wt gain, classically pleuritic or exertional cp,  orthopnea pnd or arm/hand swelling  or leg swelling, presyncope, palpitations, abdominal pain, anorexia, nausea, vomiting, diarrhea  or change in bowel habits or change in bladder habits, change in stools or change in urine, dysuria, hematuria,  rash, arthralgias, visual complaints, headache, numbness, weakness or ataxia or problems with walking or coordination,  change in mood or  memory.        Current Meds  Medication Sig  . abatacept (ORENCIA) 250 MG injection Inject into the vein every 30 (thirty) days.   Marland Kitchen albuterol (PROVENTIL) (2.5 MG/3ML) 0.083% nebulizer solution Take 3 mLs (2.5 mg total) by nebulization every 4 (four) hours as needed for wheezing or shortness of breath (with flutter  (dx: J45.991)).  Marland Kitchen amitriptyline (ELAVIL) 25 MG tablet Take 25 mg by mouth at bedtime.  . beta carotene w/minerals (OCUVITE) tablet Take 1 tablet by mouth daily.  . cholecalciferol (VITAMIN D) 1000 UNITS tablet Take 1,000 Units by mouth daily.  Marland Kitchen gabapentin (NEURONTIN) 300 MG capsule Take 600 mg by mouth at bedtime.  Marland Kitchen losartan (COZAAR) 25 MG tablet Take 25 mg by mouth daily.  . methylPREDNISolone (MEDROL) 4 MG tablet Alternate 4mg  and 2mg  every other day  . pantoprazole (PROTONIX) 40 MG tablet TAKE 1 TABLET 30-60 MINUTES BEFORE THE 1ST MEAL OF THE DAY.  . ranitidine (ZANTAC) 150 MG capsule Take 150 mg by mouth every evening.  Marland Kitchen Respiratory Therapy Supplies (FLUTTER) DEVI Use as directed  . venlafaxine XR (EFFEXOR-XR) 75 MG 24 hr capsule Take 75 mg by mouth at bedtime.                                          Objective:   Physical  Exam  amb thin wf / congested sounding cough with some pseudowheezing better with plm   Vital signs reviewed - Note on arrival 02 sats  97% on RA    07/17/2013  146  > 09/28/2014   142 > 04/18/2015  145 > 11/01/2015 143 > 02/07/2016  143 >  09/25/2016  139 > 10/22/2016  140 >  01/11/2017 146 > 10/19/2017  140 >  01/10/2018  142 >  01/17/2018  140     05/26/13 147 lb (66.679 kg)  01/13/12 145 lb (65.772 kg)      HEENT: nl dentition, turbinates bilaterally, and oropharynx. Nl external ear canals without cough reflex   NECK :  without JVD/Nodes/TM/ nl carotid upstrokes bilaterally   LUNGS: no acc muscle use,  Nl contour chest with prominent insp/exp rhonchi bilaterally with upper and lower airway features      CV:  RRR  no s3 or murmur or increase in P2, and no edema   ABD:  soft and nontender with nl inspiratory excursion in the supine position. No bruits or organomegaly appreciated, bowel sounds nl  MS:  Nl gait/ ext warm without deformities, calf tenderness, cyanosis or clubbing RA changes both hands  SKIN: warm and dry without lesions    NEURO:  alert, approp, nl sensorium with  no motor or cerebellar deficits apparent.                   Assessment & Plan:

## 2018-01-17 NOTE — Patient Instructions (Addendum)
For cough > mucinex dm 1200 mg every 12 hours and cough into the flutter valve as much as possible   Doxycycline 100 mg twice daily x 10 days with glass of water  Medrol 4mg  x 2 now and take 2  daily until cough is better then 1 daily x 5 days and then resume the previous dose   Shortness of breath/ wheezing/ still coughing > albuterol neb every 4 hours as needed

## 2018-01-18 ENCOUNTER — Encounter: Payer: Self-pay | Admitting: Internal Medicine

## 2018-01-18 NOTE — Assessment & Plan Note (Signed)
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  Each maintenance medication was reviewed in detail including emphasizing most importantly the difference between maintenance and prns and under what circumstances the prns are to be triggered using an action plan format that is not reflected in the computer generated alphabetically organized AVS which I have not found useful in most complex patients, especially with respiratory illnesses  Please see AVS for specific instructions unique to this visit that I personally wrote and verbalized to the the pt in detail and then reviewed with pt  by my nurse highlighting any  changes in therapy recommended at today's visit to their plan of care.

## 2018-01-20 ENCOUNTER — Ambulatory Visit (INDEPENDENT_AMBULATORY_CARE_PROVIDER_SITE_OTHER)
Admission: RE | Admit: 2018-01-20 | Discharge: 2018-01-20 | Disposition: A | Discharge status: Home / Self Care | Payer: Medicare Other | Source: Ambulatory Visit | Attending: Internal Medicine | Admitting: Internal Medicine

## 2018-01-20 ENCOUNTER — Ambulatory Visit (INDEPENDENT_AMBULATORY_CARE_PROVIDER_SITE_OTHER): Payer: Medicare Other | Admitting: Internal Medicine

## 2018-01-20 ENCOUNTER — Encounter: Payer: Self-pay | Admitting: Internal Medicine

## 2018-01-20 VITALS — BP 118/80 | HR 86 | Temp 98.2°F | Ht 66.0 in | Wt 137.8 lb

## 2018-01-20 DIAGNOSIS — R05 Cough: Secondary | ICD-10-CM | POA: Diagnosis not present

## 2018-01-20 DIAGNOSIS — J45991 Cough variant asthma: Secondary | ICD-10-CM | POA: Diagnosis not present

## 2018-01-20 DIAGNOSIS — R0602 Shortness of breath: Secondary | ICD-10-CM | POA: Diagnosis not present

## 2018-01-20 MED ORDER — METHYLPREDNISOLONE ACETATE 80 MG/ML IJ SUSP
120.0000 mg | Freq: Once | INTRAMUSCULAR | Status: AC
  Administered 2018-01-20: 120 mg via INTRAMUSCULAR

## 2018-01-20 MED ORDER — ACETAMINOPHEN-CODEINE #3 300-30 MG PO TABS: 1.0000 | ORAL_TABLET | ORAL | 0 refills | Status: DC | PRN

## 2018-01-20 MED ORDER — METHYLPREDNISOLONE 8 MG PO TABS: 8.0000 mg | ORAL_TABLET | Freq: Every day | ORAL | 0 refills | Status: DC

## 2018-01-20 MED ORDER — IPRATROPIUM-ALBUTEROL 0.5-2.5 (3) MG/3ML IN SOLN
3.0000 mL | Freq: Once | RESPIRATORY_TRACT | Status: AC
  Administered 2018-01-20: 3 mL via RESPIRATORY_TRACT

## 2018-01-20 NOTE — Patient Instructions (Addendum)
While coughing protonix 40 mg Take 30- 60 min before your first and last meals of the day     Shortness of breath/ wheezing/ still coughing > albuterol neb every 4 hours as needed   Depomedrol 120 mg IM and medrol 32 mg daily x 2 days,  then 16 mg x 3 days,  Then 8 mg x 4 days , then resume the 4 mg daily   For severe cough > tylenol 3# one every 4 hours if needed   Go to ER if condition worsens on above plan

## 2018-01-20 NOTE — Progress Notes (Signed)
Subjective:   Patient ID: Stacey [name], female    DOB: 11/22/1940     MRN: [id number]    Brief patient profile:  75 yowf  never smoker with allergies/inhalers as child outgrew by [employer] then  RA since around 2000  Prednisone  x decades and prev eval by Dr Joya Gaskins around 2004 for sob resolved s maint rx and referred 05/26/2013 by Dr Shelia Media for bronchitis and abn cxr   History of Present Illness  05/26/2013 1st [facility] office visit/ Marithza Malachi cc June 2014 dx pna  In [place] and remicade stopped and 100% better and placed arencia in September 2014  then abruptly worse first week in November with cough green sputum s nasal symptoms, fever low grade and no cp or cough and completely recovered prior to [place] does not recall abx but issue is why keeps getting sick and abn CT Chest (see below).   Arthritis symptoms well controlled at present on Rx for RA rec Nexium 40 mg Take 30-60 min before first meal of the day and add pepcid 20 mg one at bedtime whenever coughing.               10/19/2017  f/u ov/Analycia Khokhar re:  RA lung dz  Chief Complaint  Patient presents with  . Follow-up    Cough is much improved, but has not resolved yet. Cough is non prod. She has not had to use her neb.   Dyspnea:  Not limited by breathing from desired activities  But some doe x steps Cough: daytime > noct dry  Sleep: fine  SABA use:  No saba Medrol 4 mg a/w 2mg  per day/ ok control of arthritis  rec Start back on gabapentin up to 300 mg each am  in addition to the the two at bedtime  If not better increase the medrol to 8 mg daily until bettter then taper back to where you      01/10/2018  f/u ov/Haylen Shelnutt re:  RA  Lung dz Medrol  4 mg  One alternating with a half Chief Complaint  Patient presents with  . Follow-up    PFT's done. Her breathing has been gradually worsening since the last visit. She has occ cough- non prod.   Dyspnea gradually worse since last ov:  MMRC1 =  MMRC3 = can't walk 100 yards even  at a slow pace at a flat grade s stopping due to sob    Gradually x 3 m / more fatigue / no change in arthritis  Cough: not an issue rec Protonix 40 mg Take 30-60 min before first meal of the day  GERD diet   01/17/2018 acute extended ov/Tylee Yum re: cough on medrol 4 mg  One a/w one half  Chief Complaint  Patient presents with  . Acute Visit    started coughing 01/11/18- occ prod with minimal green sputum.  She states also wheezing and having increased SOB.    abruptly worse 01/11/18 with severe 24/7 coughing >>  prod min green mucus esp in am/ assoc with subjective wheeze and did not follow previous contingencies re flutter / saba/ increase medrol and admits she does not rember those written instructions nor how to use the neb provided .  No fever/ comfortable at rest sitting  rec For cough > mucinex dm 1200 mg every 12 hours and cough into the flutter valve as much as possible  Doxycycline 100 mg twice daily x 10 days with glass of water Medrol 4mg  x  2 now and take 2  daily until cough is better then 1 daily x 5 days and then resume the previous dose  Shortness of breath/ wheezing/ still coughing > albuterol neb every 4 hours as needed      01/20/2018 acute extended ov/Hashem Goynes re: refractory cough and sob 01/11/18 Chief Complaint  Patient presents with  . Acute Visit    she is not feeling better, coughing , very SOB, wheezing  mucus now clear/scant  on doxy/ neb machine not working (tube would not plug into the side s adequate force and she was not capable of applying it due to RA hands.  Cough/ wheeze/ sob 24/7 / flutter not helping/ can't lie down at hs     No obvious day to day or daytime variability or assoc excess/ purulent sputum or mucus plugs or hemoptysis or cp or chest tightness,   or overt sinus or hb symptoms.    Also denies any obvious fluctuation of symptoms with weather or environmental changes or other aggravating or alleviating factors except as outlined above   No unusual  exposure hx or h/o childhood pna/ asthma or knowledge of premature birth.  Current Allergies, Complete Past Medical History, Past Surgical History, Family History, and Social History were reviewed in [employer] record.  ROS  The following are not active complaints unless bolded Hoarseness, sore throat, dysphagia, dental problems, itching, sneezing,  nasal congestion or discharge of excess mucus or purulent secretions, ear ache,   fever, chills, sweats, unintended wt loss or wt gain, classically pleuritic or exertional cp,  orthopnea pnd or arm/hand swelling  or leg swelling, presyncope, palpitations, abdominal pain, anorexia, nausea, vomiting, diarrhea  or change in bowel habits or change in bladder habits, change in stools or change in urine, dysuria, hematuria,  rash, arthralgias baseline, visual complaints, headache, numbness, weakness or ataxia or problems with walking or coordination,  change in mood or  memory.        Current Meds  Medication Sig  . abatacept (ORENCIA) 250 MG injection Inject into the vein every 30 (thirty) days.   Marland Kitchen albuterol (PROVENTIL) (2.5 MG/3ML) 0.083% nebulizer solution Take 3 mLs (2.5 mg total) by nebulization every 4 (four) hours as needed for wheezing or shortness of breath (with flutter  (dx: J45.991)).  Marland Kitchen amitriptyline (ELAVIL) 25 MG tablet Take 25 mg by mouth at bedtime.  . beta carotene w/minerals (OCUVITE) tablet Take 1 tablet by mouth daily.  . cholecalciferol (VITAMIN D) 1000 UNITS tablet Take 1,000 Units by mouth daily.  Marland Kitchen doxycycline (VIBRA-TABS) 100 MG tablet Take 1 tablet (100 mg total) by mouth 2 (two) times daily.  Marland Kitchen gabapentin (NEURONTIN) 300 MG capsule Take 600 mg by mouth at bedtime.  Marland Kitchen losartan (COZAAR) 25 MG tablet Take 25 mg by mouth daily.  . methylPREDNISolone (MEDROL) 4 MG tablet Alternate 4mg  and 2mg  every other day  . pantoprazole (PROTONIX) 40 MG tablet TAKE 1 TABLET 30-60 MINUTES BEFORE THE 1ST MEAL OF THE DAY.  .  ranitidine (ZANTAC) 150 MG capsule Take 150 mg by mouth every evening.  Marland Kitchen Respiratory Therapy Supplies (FLUTTER) DEVI Use as directed  . venlafaxine XR (EFFEXOR-XR) 75 MG 24 hr capsule Take 75 mg by mouth at bedtime.              Objective:   Physical Exam  wc bound wf congested sounding coughing fits/ better p neb    Vital signs reviewed - Note on arrival 02 sats  93%  on RA    07/17/2013  146  > 09/28/2014   142 > 04/18/2015  145 > 11/01/2015 143 > 02/07/2016  143 > 09/25/2016  139 > 10/22/2016  140 >  01/11/2017 146 > 10/19/2017  140 >  01/10/2018  142 >  01/17/2018  140 > 01/20/2018  137    05/26/13 147 lb (66.679 kg)  01/13/12 145 lb (65.772 kg)        HEENT: nl dentition, turbinates bilaterally, and oropharynx. Nl external ear canals without cough reflex   NECK :  without JVD/Nodes/TM/ nl carotid upstrokes bilaterally   LUNGS: no acc muscle use,  Nl contour chest with prominent insp/ exp rhonchi bilaterally and cough on exp/ better p neb   CV:  RRR  no s3 or murmur or increase in P2, and no edema   ABD:  soft and nontender with nl inspiratory excursion in the supine position. No bruits or organomegaly appreciated, bowel sounds nl  MS:  Nl gait/ ext warm without deformities, calf tenderness, cyanosis or clubbing RA changes both hands   SKIN: warm and dry without lesions    NEURO:  alert, approp, nl sensorium with  no motor or cerebellar deficits apparent.    CXR PA and Lateral:   01/20/2018 :    I personally reviewed images and   impression as follows:  Coarse markings, no as dz             Assessment & Plan:

## 2018-01-21 ENCOUNTER — Encounter: Payer: Self-pay | Admitting: Internal Medicine

## 2018-01-21 NOTE — Assessment & Plan Note (Addendum)
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  While not intuitively obvious, many patients with chronic low grade reflux do not cough until there is a primary insult that disturbs the protective epithelial barrier and exposes sensitive nerve endings.   This is typically viral but can due to PNDS and  either may apply here.      The point is that once this occurs, it is difficult to eliminate the cycle  using anything but a maximally effective acid suppression regimen at least in the short run, accompanied by an appropriate diet to address non acid GERD and control / eliminate the cough itself for at least 3 days with tylenol #3     While coughing protonix 40 mg Take 30- 60 min before your first and last meals of the day  Shortness of breath/ wheezing/ still coughing > albuterol neb every 4 hours as needed  Depomedrol 120 mg IM and medrol 32 mg daily x 2 days,  then 16 mg x 3 days,  Then 8 mg x 4 days , then resume the 4 mg daily  For severe cough > tylenol 3# one every 4 hours if needed  Go to ER if condition worsens on above plan    I had an extended discussion with the patient reviewing all  relevant studies completed to date and  lasting 25 minutes of a 40  minute acute office  visit addressing     re  severe non-specific but potentially very serious refractory respiratory symptoms of uncertain and potentially multiple  etiologies.   Each maintenance medication was reviewed in detail including most importantly the difference between maintenance and prns and under what circumstances the prns are to be triggered using an action plan format that is not reflected in the computer generated alphabetically organized AVS.    Please see AVS for specific instructions unique to this office visit that I personally wrote and verbalized to the the pt in detail and then reviewed with pt  by my nurse highlighting any changes in therapy/plan of care  recommended at today's visit.

## 2018-01-22 ENCOUNTER — Other Ambulatory Visit: Payer: Self-pay

## 2018-01-22 ENCOUNTER — Emergency Department (HOSPITAL_COMMUNITY): Payer: Medicare Other

## 2018-01-22 ENCOUNTER — Encounter (HOSPITAL_COMMUNITY): Payer: Self-pay | Admitting: Emergency Medicine

## 2018-01-22 ENCOUNTER — Inpatient Hospital Stay (HOSPITAL_COMMUNITY)
Admission: EM | Admit: 2018-01-22 | Discharge: 2018-01-27 | DRG: 202 | Disposition: A | Discharge status: Home / Self Care | Payer: Medicare Other | Attending: Internal Medicine | Admitting: Internal Medicine

## 2018-01-22 DIAGNOSIS — Z79899 Other long term (current) drug therapy: Secondary | ICD-10-CM

## 2018-01-22 DIAGNOSIS — K228 Other specified diseases of esophagus: Secondary | ICD-10-CM | POA: Diagnosis present

## 2018-01-22 DIAGNOSIS — Z880 Allergy status to penicillin: Secondary | ICD-10-CM

## 2018-01-22 DIAGNOSIS — Z9049 Acquired absence of other specified parts of digestive tract: Secondary | ICD-10-CM | POA: Diagnosis not present

## 2018-01-22 DIAGNOSIS — R0600 Dyspnea, unspecified: Secondary | ICD-10-CM | POA: Diagnosis present

## 2018-01-22 DIAGNOSIS — I10 Essential (primary) hypertension: Secondary | ICD-10-CM | POA: Diagnosis present

## 2018-01-22 DIAGNOSIS — J45901 Unspecified asthma with (acute) exacerbation: Secondary | ICD-10-CM | POA: Diagnosis not present

## 2018-01-22 DIAGNOSIS — R0602 Shortness of breath: Secondary | ICD-10-CM | POA: Diagnosis not present

## 2018-01-22 DIAGNOSIS — R0902 Hypoxemia: Secondary | ICD-10-CM

## 2018-01-22 DIAGNOSIS — M810 Age-related osteoporosis without current pathological fracture: Secondary | ICD-10-CM | POA: Diagnosis present

## 2018-01-22 DIAGNOSIS — J984 Other disorders of lung: Secondary | ICD-10-CM | POA: Diagnosis not present

## 2018-01-22 DIAGNOSIS — R062 Wheezing: Secondary | ICD-10-CM | POA: Diagnosis not present

## 2018-01-22 DIAGNOSIS — K219 Gastro-esophageal reflux disease without esophagitis: Secondary | ICD-10-CM | POA: Diagnosis not present

## 2018-01-22 DIAGNOSIS — J849 Interstitial pulmonary disease, unspecified: Secondary | ICD-10-CM | POA: Diagnosis not present

## 2018-01-22 DIAGNOSIS — M358 Other specified systemic involvement of connective tissue: Secondary | ICD-10-CM

## 2018-01-22 DIAGNOSIS — R1310 Dysphagia, unspecified: Secondary | ICD-10-CM | POA: Diagnosis present

## 2018-01-22 DIAGNOSIS — Z882 Allergy status to sulfonamides status: Secondary | ICD-10-CM | POA: Diagnosis not present

## 2018-01-22 DIAGNOSIS — Z888 Allergy status to other drugs, medicaments and biological substances status: Secondary | ICD-10-CM

## 2018-01-22 DIAGNOSIS — R053 Chronic cough: Secondary | ICD-10-CM

## 2018-01-22 DIAGNOSIS — F419 Anxiety disorder, unspecified: Secondary | ICD-10-CM | POA: Diagnosis present

## 2018-01-22 DIAGNOSIS — B379 Candidiasis, unspecified: Secondary | ICD-10-CM | POA: Diagnosis present

## 2018-01-22 DIAGNOSIS — M069 Rheumatoid arthritis, unspecified: Secondary | ICD-10-CM | POA: Diagnosis present

## 2018-01-22 DIAGNOSIS — R05 Cough: Secondary | ICD-10-CM

## 2018-01-22 DIAGNOSIS — Z825 Family history of asthma and other chronic lower respiratory diseases: Secondary | ICD-10-CM

## 2018-01-22 DIAGNOSIS — J8489 Other specified interstitial pulmonary diseases: Secondary | ICD-10-CM

## 2018-01-22 DIAGNOSIS — J45909 Unspecified asthma, uncomplicated: Secondary | ICD-10-CM | POA: Diagnosis present

## 2018-01-22 MED ORDER — DOXYCYCLINE HYCLATE 100 MG PO TABS
100.0000 mg | ORAL_TABLET | Freq: Once | ORAL | Status: AC
  Administered 2018-01-23: 100 mg via ORAL
  Filled 2018-01-22: qty 1

## 2018-01-22 NOTE — ED Provider Notes (Signed)
[facility] [facility] DEPT Provider Note: Georgena Spurling, MD, FACEP  CSN: [id number] MRN: [id number] ARRIVAL: 01/22/18 at 2218 ROOM: [place] [facility]   HISTORY OF PRESENT ILLNESS  01/22/18 11:21 PM Stacey [name] is a 77 y.o. female with a history of asthma.  She has had a cough for the past week.  The cough is nonproductive.  She has seen her pulmonologist, Dr. work, twice this week and was started on a prednisone taper and doxycycline.  She has been using home albuterol but tonight it did not provide adequate relief and her cough worsened.  She had a severe paroxysm of coughing that lasted about an hour. EMS was called and they noted wheezing in all fields.  They administered a DuoNeb treatment after which they noted rhonchi.  She was also given 125 mg of Solu-Medrol IV PTA.  She is also felt very hot at home but states she did not have a fever.  She denies chest pain, abdominal pain, nausea, vomiting or diarrhea.  She states she feels comfortable at the present time.   Past Medical History:  Diagnosis Date  . Abnormal finding of blood chemistry   . Asthma   . H/O measles   . H/O varicella   . Hypertension   . 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 Lichen sclerosus 73/53/29   Asymptomatic  . Low iron   . Mitral valve prolapse   . Osteoarthritis   . Osteoporosis   . Post herpetic neuralgia   . Rheumatoid arthritis(714.0)   . Yeast infection     Past Surgical History:  Procedure Laterality Date  . CHOLECYSTECTOMY  2011  . WISDOM TOOTH EXTRACTION      Family History  Problem Relation Age of Onset  . Asthma Mother   . Anemia Mother   . COPD Father   . Pulmonary fibrosis Father   . Breast cancer Maternal Grandmother 88    Social History   Tobacco Use  . Smoking status: Never Smoker  . Smokeless tobacco: Never Used  Substance Use Topics  . Alcohol use: No  . Drug use: No    Prior to Admission medications   Medication Sig Start Date End  Date Taking? Authorizing Provider  abatacept (ORENCIA) 250 MG injection Inject into the vein every 30 (thirty) days.     [provider]  acetaminophen-codeine (TYLENOL #3) 300-30 MG tablet Take 1 tablet by mouth every 4 (four) hours as needed for up to 5 days (cough). 01/20/18 01/25/18  Tanda Rockers, MD  albuterol (PROVENTIL) (2.5 MG/3ML) 0.083% nebulizer solution Take 3 mLs (2.5 mg total) by nebulization every 4 (four) hours as needed for wheezing or shortness of breath (with flutter  (dx: J45.991)). 09/06/17   Parrett, Fonnie Mu, NP  amitriptyline (ELAVIL) 25 MG tablet Take 25 mg by mouth at bedtime. 02/04/15   [provider]  beta carotene w/minerals (OCUVITE) tablet Take 1 tablet by mouth daily.    [provider]  cholecalciferol (VITAMIN D) 1000 UNITS tablet Take 1,000 Units by mouth daily.    [provider]  doxycycline (VIBRA-TABS) 100 MG tablet Take 1 tablet (100 mg total) by mouth 2 (two) times daily. 01/17/18   Tanda Rockers, MD  gabapentin (NEURONTIN) 300 MG capsule Take 600 mg by mouth at bedtime.    [provider]  losartan (COZAAR) 25 MG tablet Take 25 mg by mouth daily. 02/04/15   [provider]  methylPREDNISolone (MEDROL)  4 MG tablet Alternate 4mg  and 2mg  every other day    [provider]  methylPREDNISolone (MEDROL) 8 MG tablet Take 1 tablet (8 mg total) by mouth daily. 01/20/18   Tanda Rockers, MD  pantoprazole (PROTONIX) 40 MG tablet TAKE 1 TABLET 30-60 MINUTES BEFORE THE 1ST MEAL OF THE DAY. 08/19/17   Tanda Rockers, MD  ranitidine (ZANTAC) 150 MG capsule Take 150 mg by mouth every evening.    [provider]  Respiratory Therapy Supplies (FLUTTER) DEVI Use as directed 10/22/16   Tanda Rockers, MD  venlafaxine XR (EFFEXOR-XR) 75 MG 24 hr capsule Take 75 mg by mouth at bedtime.     [provider]    Allergies Penicillins; Remicade [infliximab]; and Sulfa antibiotics   REVIEW OF SYSTEMS    Negative except as noted here or in the History of Present Illness.   PHYSICAL EXAMINATION  Initial Vital Signs Blood pressure (!) 165/88, pulse 93, temperature 98.1 F (36.7 C), temperature source Oral, resp. rate 19, SpO2 96 %.  Examination General: Well-developed, well-nourished female in no acute distress; appearance consistent with age of record HENT: normocephalic; atraumatic Eyes: pupils equal, round and reactive to light; extraocular muscles intact Neck: supple Heart: regular rate and rhythm Lungs: Faint inspiratory and expiratory wheezes bilaterally Abdomen: soft; nondistended; nontender; bowel sounds present Extremities: No deformity; full range of motion; pulses normal Neurologic: Awake, alert and oriented; motor function intact in all extremities and symmetric; no facial droop; tremulous Skin: Warm and dry Psychiatric: Normal mood and affect   RESULTS  Summary of this visit's results, reviewed by myself:   EKG Interpretation  Date/Time:  Saturday January 22 2018 23:01:02 EDT Ventricular Rate:  88 PR Interval:    QRS Duration: 91 QT Interval:  373 QTC Calculation: 452 R Axis:   56 Text Interpretation:  Sinus rhythm Baseline wander in lead(s) V2 No significant change was found Confirmed by Shalla Bulluck, [name] [phone]) on 01/22/2018 11:21:11 PM      Laboratory Studies: Results for orders placed or performed during the hospital encounter of 01/22/18 (from the past 24 hour(s))  CBC with Differential/Platelet     Status: Abnormal   Collection Time: 01/22/18 11:57 PM  Result Value Ref Range   WBC 10.3 4.0 - 10.5 K/uL   RBC 4.05 3.87 - 5.11 MIL/uL   Hemoglobin 12.2 12.0 - 15.0 g/dL   HCT 36.9 36.0 - 46.0 %   MCV 91.1 78.0 - 100.0 fL   MCH 30.1 26.0 - 34.0 pg   MCHC 33.1 30.0 - 36.0 g/dL   RDW 13.6 11.5 - 15.5 %   Platelets 328 150 - 400 K/uL   Neutrophils Relative % 83 %   Neutro Abs 8.5 (H) 1.7 - 7.7 K/uL   Lymphocytes Relative 12 %   Lymphs Abs 1.3 0.7 - 4.0 K/uL    Monocytes Relative 5 %   Monocytes Absolute 0.5 0.1 - 1.0 K/uL   Eosinophils Relative 0 %   Eosinophils Absolute 0.0 0.0 - 0.7 K/uL   Basophils Relative 0 %   Basophils Absolute 0.0 0.0 - 0.1 K/uL  Basic metabolic panel     Status: Abnormal   Collection Time: 01/22/18 11:57 PM  Result Value Ref Range   Sodium 141 135 - 145 mmol/L   Potassium 3.8 3.5 - 5.1 mmol/L   Chloride 103 98 - 111 mmol/L   CO2 28 22 - 32 mmol/L   Glucose, Bld 134 (H) 70 - 99 mg/dL  BUN 17 8 - 23 mg/dL   Creatinine, Ser 0.66 0.44 - 1.00 mg/dL   Calcium 9.1 8.9 - 10.3 mg/dL   GFR calc non Af Amer >60 >60 mL/min   GFR calc Af Amer >60 >60 mL/min   Anion gap 10 5 - 15  Troponin I     Status: None   Collection Time: 01/22/18 11:57 PM  Result Value Ref Range   Troponin I <0.03 <0.03 ng/mL  Brain natriuretic peptide     Status: Abnormal   Collection Time: 01/22/18 11:57 PM  Result Value Ref Range   B Natriuretic Peptide 100.6 (H) 0.0 - 100.0 pg/mL   Imaging Studies: Dg Chest 2 View  Result Date: 01/23/2018 CLINICAL DATA:  Shortness of breath. Cough for 1 week. History of asthma. EXAM: CHEST - 2 VIEW COMPARISON:  Radiographs 01/20/2018 and 01/10/2018. Chest CT 11/06/2016. FINDINGS: The heart size and mediastinal contours are stable. There is diffuse aortic atherosclerosis. There is chronic lung disease with subpleural reticulation and architectural distortion. The interstitial prominence noted on the most recent study has improved. No superimposed airspace disease, pleural effusion or pneumothorax demonstrated. Telemetry leads overlie the chest. No acute osseous findings. 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 This superimposed increased interstitial prominence seen on the study of 2 days ago appears improved, either due to technical factors or resolved superimposed edema. Electronically Signed   By: Richardean Sale M.D.   On: 01/23/2018 00:16     ED COURSE and MDM  Nursing notes and initial vitals signs, including pulse oximetry, reviewed.  Vitals:   01/22/18 2330 01/23/18 0000 01/23/18 0130 01/23/18 0200  BP: (!) 158/67 (!) 155/68 (!) 161/96 115/84  Pulse: 79 79 95 79  Resp: (!) 21 (!) 23 19 (!) 22  Temp:      TempSrc:      SpO2: 95% 95% 95% 90%   2:11 AM She is still coughing and wheezing despite additional Xopenex neb treatment.  We will have her admitted for persistent shortness of breath with failure of outpatient therapy.  PROCEDURES    ED DIAGNOSES     ICD-10-CM   1. Shortness of breath R06.02   2. Persistent dry cough R05   3. Chronic lung disease J98.4        Haislee Corso, MD 01/23/18 [phone]

## 2018-01-22 NOTE — ED Notes (Signed)
Bed: AC16 Expected date:  Expected time:  Means of arrival:  Comments: 77 yo f/ resp distress

## 2018-01-22 NOTE — ED Triage Notes (Signed)
Patient from home via EMS with complaints of shortness of breath. Hx asthma. Patient complaints of cough x1 weeks. Had CXR at PCP which was negative. Was started on prednisone taper. Patient attempted an albuterol treatment but cough worsened. Cough is dry. Wheezing all fields at start, ronchi after treatment. No PNA noted on previous CXR. Patient given duoneb and 125 solumedrol en route. 96% RA. NSR.

## 2018-01-23 ENCOUNTER — Encounter (HOSPITAL_COMMUNITY): Payer: Self-pay | Admitting: Internal Medicine

## 2018-01-23 DIAGNOSIS — R0600 Dyspnea, unspecified: Secondary | ICD-10-CM | POA: Diagnosis present

## 2018-01-23 DIAGNOSIS — J45901 Unspecified asthma with (acute) exacerbation: Secondary | ICD-10-CM | POA: Diagnosis present

## 2018-01-23 DIAGNOSIS — K219 Gastro-esophageal reflux disease without esophagitis: Secondary | ICD-10-CM | POA: Diagnosis present

## 2018-01-23 DIAGNOSIS — K228 Other specified diseases of esophagus: Secondary | ICD-10-CM | POA: Diagnosis present

## 2018-01-23 DIAGNOSIS — J45909 Unspecified asthma, uncomplicated: Secondary | ICD-10-CM | POA: Diagnosis not present

## 2018-01-23 DIAGNOSIS — R05 Cough: Secondary | ICD-10-CM | POA: Diagnosis not present

## 2018-01-23 DIAGNOSIS — T17320A Food in larynx causing asphyxiation, initial encounter: Secondary | ICD-10-CM | POA: Diagnosis not present

## 2018-01-23 DIAGNOSIS — I509 Heart failure, unspecified: Secondary | ICD-10-CM | POA: Diagnosis not present

## 2018-01-23 DIAGNOSIS — M810 Age-related osteoporosis without current pathological fracture: Secondary | ICD-10-CM | POA: Diagnosis present

## 2018-01-23 DIAGNOSIS — R0902 Hypoxemia: Secondary | ICD-10-CM | POA: Diagnosis not present

## 2018-01-23 DIAGNOSIS — M069 Rheumatoid arthritis, unspecified: Secondary | ICD-10-CM

## 2018-01-23 DIAGNOSIS — J984 Other disorders of lung: Secondary | ICD-10-CM | POA: Diagnosis not present

## 2018-01-23 DIAGNOSIS — F419 Anxiety disorder, unspecified: Secondary | ICD-10-CM | POA: Diagnosis present

## 2018-01-23 DIAGNOSIS — Z888 Allergy status to other drugs, medicaments and biological substances status: Secondary | ICD-10-CM | POA: Diagnosis not present

## 2018-01-23 DIAGNOSIS — Z9049 Acquired absence of other specified parts of digestive tract: Secondary | ICD-10-CM | POA: Diagnosis not present

## 2018-01-23 DIAGNOSIS — J849 Interstitial pulmonary disease, unspecified: Secondary | ICD-10-CM

## 2018-01-23 DIAGNOSIS — I10 Essential (primary) hypertension: Secondary | ICD-10-CM | POA: Diagnosis present

## 2018-01-23 DIAGNOSIS — J8489 Other specified interstitial pulmonary diseases: Secondary | ICD-10-CM

## 2018-01-23 DIAGNOSIS — M358 Other specified systemic involvement of connective tissue: Secondary | ICD-10-CM

## 2018-01-23 DIAGNOSIS — R1310 Dysphagia, unspecified: Secondary | ICD-10-CM | POA: Diagnosis not present

## 2018-01-23 DIAGNOSIS — R0602 Shortness of breath: Secondary | ICD-10-CM | POA: Diagnosis not present

## 2018-01-23 DIAGNOSIS — Z825 Family history of asthma and other chronic lower respiratory diseases: Secondary | ICD-10-CM | POA: Diagnosis not present

## 2018-01-23 DIAGNOSIS — B379 Candidiasis, unspecified: Secondary | ICD-10-CM | POA: Diagnosis present

## 2018-01-23 DIAGNOSIS — Z880 Allergy status to penicillin: Secondary | ICD-10-CM | POA: Diagnosis not present

## 2018-01-23 DIAGNOSIS — Z882 Allergy status to sulfonamides status: Secondary | ICD-10-CM | POA: Diagnosis not present

## 2018-01-23 DIAGNOSIS — Z79899 Other long term (current) drug therapy: Secondary | ICD-10-CM | POA: Diagnosis not present

## 2018-01-23 LAB — BASIC METABOLIC PANEL
Anion gap: 10 (ref 5–15)
BUN: 17 mg/dL (ref 8–23)
CO2: 28 mmol/L (ref 22–32)
Calcium: 9.1 mg/dL (ref 8.9–10.3)
Chloride: 103 mmol/L (ref 98–111)
Creatinine, Ser: 0.66 mg/dL (ref 0.44–1.00)
GFR calc Af Amer: >60 mL/min (ref >60)
GFR calc non Af Amer: >60 mL/min (ref >60)
Glucose, Bld: 134 mg/dL — ABNORMAL HIGH (ref 70–99)
Potassium: 3.8 mmol/L (ref 3.5–5.1)
Sodium: 141 mmol/L (ref 135–145)

## 2018-01-23 LAB — CBC WITH DIFFERENTIAL/PLATELET
Basophils Absolute: 0 10*3/uL (ref 0.0–0.1)
Basophils Relative: 0 %
Eosinophils Absolute: 0 10*3/uL (ref 0.0–0.7)
Eosinophils Relative: 0 %
HCT: 36.9 % (ref 36.0–46.0)
Hemoglobin: 12.2 g/dL (ref 12.0–15.0)
Lymphocytes Relative: 12 %
Lymphs Abs: 1.3 10*3/uL (ref 0.7–4.0)
MCH: 30.1 pg (ref 26.0–34.0)
MCHC: 33.1 g/dL (ref 30.0–36.0)
MCV: 91.1 fL (ref 78.0–100.0)
Monocytes Absolute: 0.5 10*3/uL (ref 0.1–1.0)
Monocytes Relative: 5 %
Neutro Abs: 8.5 10*3/uL — ABNORMAL HIGH (ref 1.7–7.7)
Neutrophils Relative %: 83 %
Platelets: 328 10*3/uL (ref 150–400)
RBC: 4.05 MIL/uL (ref 3.87–5.11)
RDW: 13.6 % (ref 11.5–15.5)
WBC: 10.3 10*3/uL (ref 4.0–10.5)

## 2018-01-23 LAB — COMPREHENSIVE METABOLIC PANEL
ALT: 16 U/L (ref 0–44)
AST: 21 U/L (ref 15–41)
Albumin: 3 g/dL — ABNORMAL LOW (ref 3.5–5.0)
Alkaline Phosphatase: 50 U/L (ref 38–126)
Anion gap: 10 (ref 5–15)
BUN: 15 mg/dL (ref 8–23)
CO2: 26 mmol/L (ref 22–32)
Calcium: 8.6 mg/dL — ABNORMAL LOW (ref 8.9–10.3)
Chloride: 100 mmol/L (ref 98–111)
Creatinine, Ser: 0.55 mg/dL (ref 0.44–1.00)
GFR calc Af Amer: >60 mL/min (ref >60)
GFR calc non Af Amer: >60 mL/min (ref >60)
Glucose, Bld: 163 mg/dL — ABNORMAL HIGH (ref 70–99)
Potassium: 4.1 mmol/L (ref 3.5–5.1)
Sodium: 136 mmol/L (ref 135–145)
Total Bilirubin: 0.5 mg/dL (ref 0.3–1.2)
Total Protein: 6.3 g/dL — ABNORMAL LOW (ref 6.5–8.1)

## 2018-01-23 LAB — CBC
HCT: 36 % (ref 36.0–46.0)
Hemoglobin: 11.6 g/dL — ABNORMAL LOW (ref 12.0–15.0)
MCH: 29.5 pg (ref 26.0–34.0)
MCHC: 32.2 g/dL (ref 30.0–36.0)
MCV: 91.6 fL (ref 78.0–100.0)
Platelets: 317 10*3/uL (ref 150–400)
RBC: 3.93 MIL/uL (ref 3.87–5.11)
RDW: 13.7 % (ref 11.5–15.5)
WBC: 7.4 10*3/uL (ref 4.0–10.5)

## 2018-01-23 LAB — BRAIN NATRIURETIC PEPTIDE
B Natriuretic Peptide: 100.6 pg/mL — ABNORMAL HIGH (ref 0.0–100.0)
B Natriuretic Peptide: 116.2 pg/mL — ABNORMAL HIGH (ref 0.0–100.0)

## 2018-01-23 LAB — TROPONIN I: Troponin I: <0.03 ng/mL (ref <0.03)

## 2018-01-23 MED ORDER — TIOTROPIUM BROMIDE MONOHYDRATE 18 MCG IN CAPS
18.0000 ug | ORAL_CAPSULE | Freq: Every day | RESPIRATORY_TRACT | Status: DC
  Administered 2018-01-23: 18 ug via RESPIRATORY_TRACT
  Filled 2018-01-23: qty 5

## 2018-01-23 MED ORDER — LORATADINE 10 MG PO TABS
10.0000 mg | ORAL_TABLET | Freq: Every day | ORAL | Status: DC
  Administered 2018-01-23 – 2018-01-27 (×5): 10 mg via ORAL
  Filled 2018-01-23 (×5): qty 1

## 2018-01-23 MED ORDER — ACETAMINOPHEN 325 MG PO TABS: 650.0000 mg | ORAL_TABLET | Freq: Four times a day (QID) | ORAL | Status: DC | PRN

## 2018-01-23 MED ORDER — VITAMIN D3 25 MCG (1000 UNIT) PO TABS
1000.0000 [IU] | ORAL_TABLET | Freq: Every day | ORAL | Status: DC
  Administered 2018-01-23 – 2018-01-27 (×5): 1000 [IU] via ORAL
  Filled 2018-01-23 (×5): qty 1

## 2018-01-23 MED ORDER — METHYLPREDNISOLONE SODIUM SUCC 125 MG IJ SOLR
80.0000 mg | Freq: Three times a day (TID) | INTRAMUSCULAR | Status: DC
  Administered 2018-01-23 – 2018-01-25 (×7): 80 mg via INTRAVENOUS
  Filled 2018-01-23 (×7): qty 2

## 2018-01-23 MED ORDER — IPRATROPIUM BROMIDE 0.02 % IN SOLN: 0.5000 mg | RESPIRATORY_TRACT | Status: DC | PRN

## 2018-01-23 MED ORDER — LEVALBUTEROL HCL 0.63 MG/3ML IN NEBU: 0.6300 mg | INHALATION_SOLUTION | RESPIRATORY_TRACT | Status: DC | PRN

## 2018-01-23 MED ORDER — ALBUTEROL SULFATE (2.5 MG/3ML) 0.083% IN NEBU
2.5000 mg | INHALATION_SOLUTION | Freq: Four times a day (QID) | RESPIRATORY_TRACT | Status: DC
  Administered 2018-01-23: 2.5 mg via RESPIRATORY_TRACT

## 2018-01-23 MED ORDER — PANTOPRAZOLE SODIUM 40 MG PO TBEC
40.0000 mg | DELAYED_RELEASE_TABLET | Freq: Every day | ORAL | Status: DC
  Administered 2018-01-23 – 2018-01-24 (×2): 40 mg via ORAL
  Filled 2018-01-23 (×2): qty 1

## 2018-01-23 MED ORDER — VENLAFAXINE HCL ER 75 MG PO CP24
75.0000 mg | ORAL_CAPSULE | Freq: Every day | ORAL | Status: DC
  Administered 2018-01-23 – 2018-01-26 (×4): 75 mg via ORAL
  Filled 2018-01-23 (×4): qty 1

## 2018-01-23 MED ORDER — LEVALBUTEROL HCL 0.63 MG/3ML IN NEBU
0.6300 mg | INHALATION_SOLUTION | RESPIRATORY_TRACT | Status: DC | PRN
  Administered 2018-01-25: 0.63 mg via RESPIRATORY_TRACT
  Filled 2018-01-23 (×2): qty 3

## 2018-01-23 MED ORDER — FLUTICASONE PROPIONATE 50 MCG/ACT NA SUSP
2.0000 | Freq: Every day | NASAL | Status: DC
  Administered 2018-01-23: 2 via NASAL
  Filled 2018-01-23: qty 16

## 2018-01-23 MED ORDER — BENZONATATE 100 MG PO CAPS
100.0000 mg | ORAL_CAPSULE | Freq: Once | ORAL | Status: AC
  Administered 2018-01-23: 100 mg via ORAL
  Filled 2018-01-23: qty 1

## 2018-01-23 MED ORDER — LEVALBUTEROL HCL 0.63 MG/3ML IN NEBU
0.6300 mg | INHALATION_SOLUTION | Freq: Four times a day (QID) | RESPIRATORY_TRACT | Status: DC
  Administered 2018-01-23 (×2): 0.63 mg via RESPIRATORY_TRACT
  Filled 2018-01-23 (×2): qty 3

## 2018-01-23 MED ORDER — FUROSEMIDE 10 MG/ML IJ SOLN: 40.0000 mg | Freq: Once | INTRAMUSCULAR | Status: DC

## 2018-01-23 MED ORDER — GABAPENTIN 300 MG PO CAPS
600.0000 mg | ORAL_CAPSULE | Freq: Every day | ORAL | Status: DC
  Administered 2018-01-23 – 2018-01-26 (×4): 600 mg via ORAL
  Filled 2018-01-23 (×4): qty 2

## 2018-01-23 MED ORDER — PROSIGHT PO TABS
1.0000 | ORAL_TABLET | Freq: Every day | ORAL | Status: DC
Start: 2018-01-23 — End: 2018-01-27
  Administered 2018-01-23 – 2018-01-27 (×5): 1 via ORAL
  Filled 2018-01-23 (×5): qty 1

## 2018-01-23 MED ORDER — BENZONATATE 100 MG PO CAPS
100.0000 mg | ORAL_CAPSULE | Freq: Three times a day (TID) | ORAL | Status: DC
  Administered 2018-01-23 (×2): 100 mg via ORAL
  Filled 2018-01-23 (×4): qty 1

## 2018-01-23 MED ORDER — BUDESONIDE 0.5 MG/2ML IN SUSP
0.5000 mg | Freq: Two times a day (BID) | RESPIRATORY_TRACT | Status: DC
Start: 2018-01-23 — End: 2018-01-27
  Administered 2018-01-23 – 2018-01-27 (×8): 0.5 mg via RESPIRATORY_TRACT
  Filled 2018-01-23 (×8): qty 2

## 2018-01-23 MED ORDER — ENOXAPARIN SODIUM 40 MG/0.4ML ~~LOC~~ SOLN
40.0000 mg | Freq: Every day | SUBCUTANEOUS | Status: DC
  Administered 2018-01-23 – 2018-01-27 (×2): 40 mg via SUBCUTANEOUS
  Filled 2018-01-23 (×4): qty 0.4

## 2018-01-23 MED ORDER — SODIUM CHLORIDE 0.9% FLUSH: 3.0000 mL | INTRAVENOUS | Status: DC | PRN

## 2018-01-23 MED ORDER — LEVALBUTEROL HCL 0.63 MG/3ML IN NEBU
0.6300 mg | INHALATION_SOLUTION | Freq: Three times a day (TID) | RESPIRATORY_TRACT | Status: DC
  Administered 2018-01-24 – 2018-01-27 (×10): 0.63 mg via RESPIRATORY_TRACT
  Filled 2018-01-23 (×11): qty 3

## 2018-01-23 MED ORDER — HYDROCODONE-HOMATROPINE 5-1.5 MG/5ML PO SYRP
5.0000 mL | ORAL_SOLUTION | ORAL | Status: DC | PRN
  Administered 2018-01-23 – 2018-01-27 (×11): 5 mL via ORAL
  Filled 2018-01-23 (×11): qty 5

## 2018-01-23 MED ORDER — FAMOTIDINE 20 MG PO TABS
10.0000 mg | ORAL_TABLET | Freq: Two times a day (BID) | ORAL | Status: DC
  Administered 2018-01-23 – 2018-01-24 (×3): 10 mg via ORAL
  Filled 2018-01-23 (×3): qty 1

## 2018-01-23 MED ORDER — SODIUM CHLORIDE 0.9 % IV SOLN: 250.0000 mL | INTRAVENOUS | Status: DC | PRN

## 2018-01-23 MED ORDER — SODIUM CHLORIDE 0.9% FLUSH
3.0000 mL | Freq: Two times a day (BID) | INTRAVENOUS | Status: DC
  Administered 2018-01-23 – 2018-01-27 (×7): 3 mL via INTRAVENOUS

## 2018-01-23 MED ORDER — IPRATROPIUM BROMIDE 0.02 % IN SOLN
0.5000 mg | RESPIRATORY_TRACT | Status: DC | PRN
  Administered 2018-01-24: 0.5 mg via RESPIRATORY_TRACT
  Filled 2018-01-23: qty 2.5

## 2018-01-23 MED ORDER — HYDROCOD POLST-CPM POLST ER 10-8 MG/5ML PO SUER
5.0000 mL | Freq: Once | ORAL | Status: AC
  Administered 2018-01-23: 5 mL via ORAL
  Filled 2018-01-23: qty 5

## 2018-01-23 MED ORDER — ALBUTEROL SULFATE (2.5 MG/3ML) 0.083% IN NEBU
2.5000 mg | INHALATION_SOLUTION | Freq: Four times a day (QID) | RESPIRATORY_TRACT | Status: DC | PRN
  Filled 2018-01-23: qty 3

## 2018-01-23 MED ORDER — IPRATROPIUM BROMIDE 0.02 % IN SOLN
0.5000 mg | Freq: Three times a day (TID) | RESPIRATORY_TRACT | Status: DC
  Administered 2018-01-24 – 2018-01-27 (×10): 0.5 mg via RESPIRATORY_TRACT
  Filled 2018-01-23 (×11): qty 2.5

## 2018-01-23 MED ORDER — LEVALBUTEROL HCL 0.63 MG/3ML IN NEBU
0.6300 mg | INHALATION_SOLUTION | Freq: Once | RESPIRATORY_TRACT | Status: AC
  Administered 2018-01-23: 0.63 mg via RESPIRATORY_TRACT
  Filled 2018-01-23: qty 3

## 2018-01-23 MED ORDER — AMITRIPTYLINE HCL 25 MG PO TABS
25.0000 mg | ORAL_TABLET | Freq: Every day | ORAL | Status: DC
  Administered 2018-01-23 – 2018-01-26 (×4): 25 mg via ORAL
  Filled 2018-01-23 (×4): qty 1

## 2018-01-23 MED ORDER — LEVOFLOXACIN IN D5W 500 MG/100ML IV SOLN
500.0000 mg | Freq: Every day | INTRAVENOUS | Status: DC
  Administered 2018-01-23 – 2018-01-26 (×4): 500 mg via INTRAVENOUS
  Filled 2018-01-23 (×4): qty 100

## 2018-01-23 MED ORDER — IPRATROPIUM BROMIDE 0.02 % IN SOLN
0.5000 mg | Freq: Four times a day (QID) | RESPIRATORY_TRACT | Status: DC
  Administered 2018-01-23: 0.5 mg via RESPIRATORY_TRACT
  Filled 2018-01-23: qty 2.5

## 2018-01-23 MED ORDER — LOSARTAN POTASSIUM 25 MG PO TABS
25.0000 mg | ORAL_TABLET | Freq: Every day | ORAL | Status: DC
  Administered 2018-01-23 – 2018-01-27 (×5): 25 mg via ORAL
  Filled 2018-01-23 (×6): qty 1

## 2018-01-23 MED ORDER — FUROSEMIDE 10 MG/ML IJ SOLN
20.0000 mg | Freq: Once | INTRAMUSCULAR | Status: AC
  Administered 2018-01-23: 20 mg via INTRAVENOUS
  Filled 2018-01-23: qty 2

## 2018-01-23 MED ORDER — ACETAMINOPHEN 650 MG RE SUPP: 650.0000 mg | Freq: Four times a day (QID) | RECTAL | Status: DC | PRN

## 2018-01-23 NOTE — Consult Note (Signed)
Name: Stacey [name] MRN: [id number] DOB: [birth year]    ADMISSION DATE:  01/22/2018 CONSULTATION DATE:  01/23/2018  REFERRING MD :  Nile Riggs [place] Silos  CHIEF COMPLAINT:  DOE and SOB  BRIEF PATIENT DESCRIPTION: 77 year old female with history of rheumatoid arthritis who presents to the ED with increased DOE.  Patient was seen in the pulmonary office 4 days prior and was given a "shot" of steroids and doxycycline but now is feeling worse.  Presenting with cough, hypoxemia and CXR consistent with ILD.  Denies fever, chills, N/V or hemoptysis.  SIGNIFICANT EVENTS  7/13 admission for DOE  STUDIES:  CXR that I reviewed myself showing some evidence of ILD but very mild   HISTORY OF PRESENT ILLNESS:  77 year old female with history of rheumatoid arthritis who presents to the ED with increased DOE.  Patient was seen in the pulmonary office 4 days prior and was given a "shot" of steroids and doxycycline but now is feeling worse.  Presenting with cough, hypoxemia and CXR consistent with ILD.  Denies fever, chills, N/V or hemoptysis.  PAST MEDICAL HISTORY :   has a past medical history of Abnormal finding of blood chemistry, Asthma, H/O measles, H/O varicella, Hypertension, Leukoplakia of vulva (99/37/16), Lichen sclerosus (96/78/93), Low iron, Mitral valve prolapse, Osteoarthritis, Osteoporosis, Post herpetic neuralgia, Rheumatoid arthritis(714.0), and Yeast infection.  has a past surgical history that includes Wisdom tooth extraction and Cholecystectomy (2011). Prior to Admission medications   Medication Sig Start Date End Date Taking? Authorizing Provider  abatacept (ORENCIA) 250 MG injection Inject into the vein every 30 (thirty) days.    Yes [provider]  albuterol (PROVENTIL) (2.5 MG/3ML) 0.083% nebulizer solution Take 3 mLs (2.5 mg total) by nebulization every 4 (four) hours as needed for wheezing or shortness of breath (with flutter  (dx: J45.991)). 09/06/17  Yes Parrett, Tammy S,  NP  amitriptyline (ELAVIL) 25 MG tablet Take 25 mg by mouth at bedtime. 02/04/15  Yes [provider]  beta carotene w/minerals (OCUVITE) tablet Take 1 tablet by mouth daily.   Yes [provider]  cholecalciferol (VITAMIN D) 1000 UNITS tablet Take 1,000 Units by mouth daily.   Yes [provider]  doxycycline (VIBRA-TABS) 100 MG tablet Take 1 tablet (100 mg total) by mouth 2 (two) times daily. 01/17/18  Yes Tanda Rockers, MD  gabapentin (NEURONTIN) 300 MG capsule Take 600 mg by mouth at bedtime.   Yes [provider]  losartan (COZAAR) 25 MG tablet Take 25 mg by mouth daily. 02/04/15  Yes [provider]  methylPREDNISolone (MEDROL) 4 MG tablet Alternate 4mg  and 2mg  every other day   Yes [provider]  methylPREDNISolone (MEDROL) 8 MG tablet Take 1 tablet (8 mg total) by mouth daily. 01/20/18  Yes Tanda Rockers, MD  pantoprazole (PROTONIX) 40 MG tablet TAKE 1 TABLET 30-60 MINUTES BEFORE THE 1ST MEAL OF THE DAY. 08/19/17  Yes Tanda Rockers, MD  ranitidine (ZANTAC) 150 MG capsule Take 150 mg by mouth every evening.   Yes [provider]  venlafaxine XR (EFFEXOR-XR) 75 MG 24 hr capsule Take 75 mg by mouth at bedtime.    Yes [provider]  acetaminophen-codeine (TYLENOL #3) 300-30 MG tablet Take 1 tablet by mouth every 4 (four) hours as needed for up to 5 days (cough). 01/20/18 01/25/18  Tanda Rockers, MD  Respiratory Therapy Supplies (FLUTTER) DEVI Use as directed 10/22/16   Tanda Rockers, MD  Allergies  Allergen Reactions  . Penicillins     Unknown Has patient had a PCN reaction causing immediate rash, facial/tongue/throat swelling, SOB or lightheadedness with hypotension: Unknown Has patient had a PCN reaction causing severe rash involving mucus membranes or skin necrosis: Unknown Has patient had a PCN reaction that required hospitalization: No Has patient had a PCN reaction occurring within the last 10 years: No If  all of the above answers are "NO", then may proceed with Cephalosporin use.   . Remicade [Infliximab]   . Sulfa Antibiotics     Unknown reaction    FAMILY HISTORY:  family history includes Anemia in her mother; Asthma in her mother; Breast cancer (age of onset: 46) in her maternal grandmother; COPD in her father; Pulmonary fibrosis in her father. SOCIAL HISTORY:  reports that she has never smoked. She has never used smokeless tobacco. She reports that she does not drink alcohol or use drugs.  REVIEW OF SYSTEMS:   Constitutional: Negative for fever, chills, weight loss, malaise/fatigue and diaphoresis.  HENT: Negative for hearing loss, ear pain, nosebleeds, congestion, sore throat, neck pain, tinnitus and ear discharge.   Eyes: Negative for blurred vision, double vision, photophobia, pain, discharge and redness.  Respiratory: Negative for cough, hemoptysis, sputum production, shortness of breath, wheezing and stridor.  DOE Cardiovascular: Negative for chest pain, palpitations, orthopnea, claudication, leg swelling and PND.  Gastrointestinal: Negative for heartburn, nausea, vomiting, abdominal pain, diarrhea, constipation, blood in stool and melena.  Genitourinary: Negative for dysuria, urgency, frequency, hematuria and flank pain.  Musculoskeletal: Negative for myalgias, back pain, joint pain and falls.  Skin: Negative for itching and rash.  Neurological: Negative for dizziness, tingling, tremors, sensory change, speech change, focal weakness, seizures, loss of consciousness, weakness and headaches.  Endo/Heme/Allergies: Negative for environmental allergies and polydipsia. Does not bruise/bleed easily.  SUBJECTIVE:   VITAL SIGNS: Temp:  [97.7 F (36.5 C)-98.1 F (36.7 C)] 97.7 F (36.5 C) (07/14 0357) Pulse Rate:  [78-95] 78 (07/14 0357) Resp:  [18-25] 18 (07/14 0357) BP: (115-166)/(65-96) 134/74 (07/14 0357) SpO2:  [90 %-98 %] 98 % (07/14 0854) Weight:  [136 lb 7.4 oz (61.9 kg)]  136 lb 7.4 oz (61.9 kg) (07/14 0357)  PHYSICAL EXAMINATION: General:  Chronically ill appearing female, NAD, on 3L [place] Neuro:  Alert and interactive, moving all ext to commadn HEENT:  [place]/AT, PERRL, EOM-I and MMM Cardiovascular:  RRR, Nl S1/S2 and -M/R/G Lungs:  Distant BS diffusely, no crackle noted Abdomen:  Soft, NT, ND and +BS Musculoskeletal:  -edema and tenderness Skin:  Intact  Recent Labs  Lab 01/22/18 2357 01/23/18 0817  NA 141 136  K 3.8 4.1  CL 103 100  CO2 28 26  BUN 17 15  CREATININE 0.66 0.55  GLUCOSE 134* 163*   Recent Labs  Lab 01/22/18 2357 01/23/18 0817  HGB 12.2 11.6*  HCT 36.9 36.0  WBC 10.3 7.4  PLT 328 317   Dg Chest 2 View  Result Date: 01/23/2018 CLINICAL DATA:  Shortness of breath. Cough for 1 week. History of asthma. EXAM: CHEST - 2 VIEW COMPARISON:  Radiographs 01/20/2018 and 01/10/2018. Chest CT 11/06/2016. FINDINGS: The heart size and mediastinal contours are stable. There is diffuse aortic atherosclerosis. There is chronic lung disease with subpleural reticulation and architectural distortion. The interstitial prominence noted on the most recent study has improved. No superimposed airspace disease, pleural effusion or pneumothorax demonstrated. Telemetry leads overlie the chest. No acute osseous findings. 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 This superimposed increased interstitial prominence seen on the study of 2 days ago appears improved, either due to technical factors or resolved superimposed edema. Electronically Signed   By: Richardean Sale M.D.   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  PCCM consulted to assist with pulmonary management.  Discussed with PCCM-NP.  Hypoxemia:  - Titrate O2 for sat of 88-92%  - Will need an  ambulatory desaturation study in a few days after steroids have taken effect to determine if home O2 is needed.  Cough: ?bronchitis  - Mucinex  - Monitor clinically  - Steroids and levaquin as ordered  DOE: unsure if there is a cardiac component here, no echo on records  - BNP  - 2D echo in AM  ILD:  - Solumedrol 80 mg IV q8  - Levaquin for airway sterilization  PCCM will follow with you.  Rush Farmer, M.D. [facility] Pulmonary[facility] Medicine. Pager: [phone]. After hours pager: [phone].  01/23/2018, 11:37 AM

## 2018-01-23 NOTE — Progress Notes (Signed)
Entered room to give [facility] txs and patient stated that they make her coughing worse to the point that she is unable to rest/sleep and that the cough medications are not effective enough to give her relief.  Spoke to RN and informed of patients refusal of medications and the reasoning for refusal. Suggested patient have discussion with Mdin morning regarding alternatives for treatment.

## 2018-01-23 NOTE — Progress Notes (Signed)
I have seen and assessed patient and agree with Dr. Julianne Rice assessment and plan. Patient is a 77 year old female history of rheumatoid arthritis, ILD, asthma presented to the ED with worsening shortness of breath, dry nonproductive cough.  Patient recently given steroid injection as well as doxycycline however with no significant improvement and presented to the ED.  Patient placed on IV steroids, IV antibiotics, Spiriva, nebulizer treatment.  Will place on Pulmicort.  Discontinue Spiriva and place on Atrovent and Xopenex nebulizers.  Pulmonary following and I appreciate the input and recommendations.  No charge.

## 2018-01-23 NOTE — H&P (Signed)
TRH H&P   Patient Demographics:    Stacey [name], is a 77 y.o. female  MRN: [id number]   DOB - 07-24-1940  Admit Date - 01/22/2018  Outpatient Primary MD for the patient is Shon Baton, MD  Referring MD/NP/PA: Shanon Rosser  Outpatient Specialists:  Dr. Stann Mainland (rheumatology) at [facility] Dr. Melvyn Novas (pulmonary)  Patient coming from:  home  Chief Complaint  Patient presents with  . Shortness of Breath      HPI:    Stacey [name]  is a 77 y.o. female, w Rheumatoid arthritis, ILD Asthma, apparently c/o increase in dyspnea this evening. Dry cough.   Denies fever, chills, cp, palp,  N/v, diarrhea, brbpr, black stool.   Pt notes recently being given steroid injection in office as well as being placed on doxycycline. 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 This superimposed increased interstitial prominence seen on the study of 2 days ago appears improved, either due to technical factors or resolved superimposed edema.  Wbc 10.3, Hgb 12.2, Plt 328 Na 141, K 3.8, Bun 17, Creatinine 0.66 BNP 100.6 Trop <0.03  Pt will be admitted for dyspnea secondary to ILD / asthma exacerbation.     Review of systems:    In addition to the HPI above,  No Fever-chills, No Headache, No changes with Vision or hearing, No problems swallowing food or Liquids, No Chest pain, No Abdominal pain, No Nausea or Vommitting, Bowel movements are regular, No Blood in stool or Urine, No dysuria, No new skin rashes or bruises, No new joints pains-aches,  No new weakness, tingling, numbness in any extremity, No recent weight gain or loss, No polyuria, polydypsia or polyphagia, No significant Mental Stressors.  A full 10 point Review of Systems was done, except as stated  above, all other Review of Systems were negative.   With Past History of the following :    Past Medical History:  Diagnosis Date  . Abnormal finding of blood chemistry   . Asthma   . H/O measles   . H/O varicella   . Hypertension   . 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 Lichen sclerosus 24/26/83   Asymptomatic  . Low iron   . Mitral valve prolapse   . Osteoarthritis   . Osteoporosis   . Post herpetic neuralgia   . Rheumatoid arthritis(714.0)   . Yeast infection       Past Surgical History:  Procedure Laterality Date  . CHOLECYSTECTOMY  2011  . WISDOM TOOTH EXTRACTION        Social History:     Social History   Tobacco Use  . Smoking status: Never Smoker  . Smokeless tobacco: Never Used  Substance Use Topics  . Alcohol use: No     Lives - at home  Mobility - walks by self   Family History :     Family History  Problem Relation Age of Onset  . Asthma Mother   . Anemia Mother   . COPD Father   . Pulmonary fibrosis Father   . Breast cancer Maternal Grandmother 88       Home Medications:   Prior to Admission medications   Medication Sig Start Date End Date Taking? Authorizing Provider  abatacept (ORENCIA) 250 MG injection Inject into the vein every 30 (thirty) days.    Yes [provider]  albuterol (PROVENTIL) (2.5 MG/3ML) 0.083% nebulizer solution Take 3 mLs (2.5 mg total) by nebulization every 4 (four) hours as needed for wheezing or shortness of breath (with flutter  (dx: J45.991)). 09/06/17  Yes Parrett, Tammy S, NP  amitriptyline (ELAVIL) 25 MG tablet Take 25 mg by mouth at bedtime. 02/04/15  Yes [provider]  beta carotene w/minerals (OCUVITE) tablet Take 1 tablet by mouth daily.   Yes [provider]  cholecalciferol (VITAMIN D) 1000 UNITS tablet Take 1,000 Units by mouth daily.   Yes [provider]  doxycycline (VIBRA-TABS) 100 MG tablet Take 1 tablet (100 mg total) by mouth 2 (two) times daily. 01/17/18  Yes  Tanda Rockers, MD  gabapentin (NEURONTIN) 300 MG capsule Take 600 mg by mouth at bedtime.   Yes [provider]  losartan (COZAAR) 25 MG tablet Take 25 mg by mouth daily. 02/04/15  Yes [provider]  methylPREDNISolone (MEDROL) 4 MG tablet Alternate 4mg  and 2mg  every other day   Yes [provider]  methylPREDNISolone (MEDROL) 8 MG tablet Take 1 tablet (8 mg total) by mouth daily. 01/20/18  Yes Tanda Rockers, MD  pantoprazole (PROTONIX) 40 MG tablet TAKE 1 TABLET 30-60 MINUTES BEFORE THE 1ST MEAL OF THE DAY. 08/19/17  Yes Tanda Rockers, MD  ranitidine (ZANTAC) 150 MG capsule Take 150 mg by mouth every evening.   Yes [provider]  venlafaxine XR (EFFEXOR-XR) 75 MG 24 hr capsule Take 75 mg by mouth at bedtime.    Yes [provider]  acetaminophen-codeine (TYLENOL #3) 300-30 MG tablet Take 1 tablet by mouth every 4 (four) hours as needed for up to 5 days (cough). 01/20/18 01/25/18  Tanda Rockers, MD  Respiratory Therapy Supplies (FLUTTER) DEVI Use as directed 10/22/16   Tanda Rockers, MD     Allergies:     Allergies  Allergen Reactions  . Penicillins     Unknown Has patient had a PCN reaction causing immediate rash, facial/tongue/throat swelling, SOB or lightheadedness with hypotension: Unknown Has patient had a PCN reaction causing severe rash involving mucus membranes or skin necrosis: Unknown Has patient had a PCN reaction that required hospitalization: No Has patient had a PCN reaction occurring within the last 10 years: No If all of the above answers are "NO", then may proceed with Cephalosporin use.   . Remicade [Infliximab]   . Sulfa Antibiotics     Unknown reaction     Physical Exam:   Vitals  Blood pressure 122/86, pulse 81, temperature 98.1 F (36.7 C), temperature source Oral, resp. rate 18, SpO2 93 %.   1. General  lying in bed in NAD,   2. Normal affect and insight, Not Suicidal or Homicidal, Awake Alert, Oriented  X 3.  3. No F.N deficits, ALL C.Nerves Intact, Strength 5/5 all 4 extremities, Sensation intact all 4 extremities, Plantars down going.  4. Ears and Eyes appear  Normal, Conjunctivae clear, PERRLA. Moist Oral Mucosa.  5. Supple Neck, No JVD, No cervical lymphadenopathy appriciated, No Carotid Bruits.  6. Symmetrical Chest wall movement, slight bilateral exp wheezing, as well as bilateral crackles.   7. RRR, No Gallops, Rubs or Murmurs, No Parasternal Heave.  8. Positive Bowel Sounds, Abdomen Soft, No tenderness, No organomegaly appriciated,No rebound -guarding or rigidity.  9.  No Cyanosis, Normal Skin Turgor, No Skin Rash or Bruise.  10. Good muscle tone,  joints appear normal , no effusions, Normal ROM.  11. No Palpable Lymph Nodes in Neck or Axillae      Data Review:    CBC Recent Labs  Lab 01/22/18 2357  WBC 10.3  HGB 12.2  HCT 36.9  PLT 328  MCV 91.1  MCH 30.1  MCHC 33.1  RDW 13.6  LYMPHSABS 1.3  MONOABS 0.5  EOSABS 0.0  BASOSABS 0.0   ------------------------------------------------------------------------------------------------------------------  Chemistries  Recent Labs  Lab 01/22/18 2357  NA 141  K 3.8  CL 103  CO2 28  GLUCOSE 134*  BUN 17  CREATININE 0.66  CALCIUM 9.1   ------------------------------------------------------------------------------------------------------------------ estimated creatinine clearance is 54.3 mL/min (by C-G formula based on SCr of 0.66 mg/dL). ------------------------------------------------------------------------------------------------------------------ No results for input(s): TSH, T4TOTAL, T3FREE, THYROIDAB in the last 72 hours.  Invalid input(s): FREET3  Coagulation profile No results for input(s): INR, PROTIME in the last 168 hours. ------------------------------------------------------------------------------------------------------------------- No results for input(s): DDIMER in the last 72  hours. -------------------------------------------------------------------------------------------------------------------  Cardiac Enzymes Recent Labs  Lab 01/22/18 2357  TROPONINI <0.03   ------------------------------------------------------------------------------------------------------------------    Component Value Date/Time   BNP 100.6 (H) 01/22/2018 2357     ---------------------------------------------------------------------------------------------------------------  Urinalysis    Component Value Date/Time   COLORURINE YELLOW 12/18/2009 0120   APPEARANCEUR CLEAR 12/18/2009 0120   LABSPEC 1.006 12/18/2009 0120   PHURINE 7.0 12/18/2009 0120   GLUCOSEU NEGATIVE 12/18/2009 0120   HGBUR NEGATIVE 12/18/2009 0120   BILIRUBINUR NEGATIVE 12/18/2009 0120   KETONESUR NEGATIVE 12/18/2009 0120   PROTEINUR NEGATIVE 12/18/2009 0120   UROBILINOGEN 0.2 12/18/2009 0120   NITRITE NEGATIVE 12/18/2009 0120   LEUKOCYTESUR TRACE (A) 12/18/2009 0120    ----------------------------------------------------------------------------------------------------------------   Imaging Results:    Dg Chest 2 View  Result Date: 01/23/2018 CLINICAL DATA:  Shortness of breath. Cough for 1 week. History of asthma. EXAM: CHEST - 2 VIEW COMPARISON:  Radiographs 01/20/2018 and 01/10/2018. Chest CT 11/06/2016. FINDINGS: The heart size and mediastinal contours are stable. There is diffuse aortic atherosclerosis. There is chronic lung disease with subpleural reticulation and architectural distortion. The interstitial prominence noted on the most recent study has improved. No superimposed airspace disease, pleural effusion or pneumothorax demonstrated. Telemetry leads overlie the chest. No acute osseous findings. 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 This superimposed increased interstitial prominence seen on the study of 2 days ago  appears improved, either due to technical factors or resolved superimposed edema. Electronically Signed   By: Richardean Sale M.D.   On: 01/23/2018 00:16       Assessment & Plan:    Principal Problem:   Dyspnea    Dyspnea Likely ILD and asthma Solumedrol 80mg  iv q8h levaquin 500mg  iv qday DC doxycycline; spiriva 1puff qday Albuterol neb q6h and q6h prn  Pulmonary consult   Gerd Cont PPI Cont H2 blocker  RA stable Cont orencia as outpatient  Anxiety Cont Effexor XR 75mg  po qhs  DVT Prophylaxis  Lovenox - SCDs  AM Labs Ordered,  also please review Full Orders  Family Communication: Admission, patients condition and plan of care including tests being ordered have been discussed with the patient  who indicate understanding and agree with the plan and Code Status.  Code Status FULL CODE  Likely DC to  home  Condition GUARDED   Consults called: pulmonary consult called by me to EICU  Admission status: inpatient  Time spent in minutes : 70   Jani Gravel M.D on 01/23/2018 at 3:25 AM  Between 7am to 7pm - Pager - [phone]  After 7pm go to [email or web address] - password [email or web address]  [facility] - Office  [phone]

## 2018-01-24 ENCOUNTER — Inpatient Hospital Stay (HOSPITAL_COMMUNITY): Payer: Medicare Other

## 2018-01-24 DIAGNOSIS — J984 Other disorders of lung: Secondary | ICD-10-CM

## 2018-01-24 DIAGNOSIS — R1310 Dysphagia, unspecified: Secondary | ICD-10-CM

## 2018-01-24 DIAGNOSIS — I509 Heart failure, unspecified: Secondary | ICD-10-CM

## 2018-01-24 DIAGNOSIS — R0902 Hypoxemia: Secondary | ICD-10-CM

## 2018-01-24 LAB — BASIC METABOLIC PANEL
Anion gap: 11 (ref 5–15)
BUN: 20 mg/dL (ref 8–23)
CO2: 28 mmol/L (ref 22–32)
Calcium: 8.7 mg/dL — ABNORMAL LOW (ref 8.9–10.3)
Chloride: 96 mmol/L — ABNORMAL LOW (ref 98–111)
Creatinine, Ser: 0.79 mg/dL (ref 0.44–1.00)
GFR calc Af Amer: >60 mL/min (ref >60)
GFR calc non Af Amer: >60 mL/min (ref >60)
Glucose, Bld: 152 mg/dL — ABNORMAL HIGH (ref 70–99)
Potassium: 4 mmol/L (ref 3.5–5.1)
Sodium: 135 mmol/L (ref 135–145)

## 2018-01-24 LAB — CBC WITH DIFFERENTIAL/PLATELET
Basophils Absolute: 0 10*3/uL (ref 0.0–0.1)
Basophils Relative: 0 %
Eosinophils Absolute: 0 10*3/uL (ref 0.0–0.7)
Eosinophils Relative: 0 %
HCT: 38.6 % (ref 36.0–46.0)
Hemoglobin: 12.5 g/dL (ref 12.0–15.0)
Lymphocytes Relative: 15 %
Lymphs Abs: 1.6 10*3/uL (ref 0.7–4.0)
MCH: 29.9 pg (ref 26.0–34.0)
MCHC: 32.4 g/dL (ref 30.0–36.0)
MCV: 92.3 fL (ref 78.0–100.0)
Monocytes Absolute: 0.5 10*3/uL (ref 0.1–1.0)
Monocytes Relative: 4 %
Neutro Abs: 8.3 10*3/uL — ABNORMAL HIGH (ref 1.7–7.7)
Neutrophils Relative %: 81 %
Platelets: 335 10*3/uL (ref 150–400)
RBC: 4.18 MIL/uL (ref 3.87–5.11)
RDW: 13.9 % (ref 11.5–15.5)
WBC: 10.4 10*3/uL (ref 4.0–10.5)

## 2018-01-24 LAB — ECHOCARDIOGRAM COMPLETE
Height: 66 in
Weight: 2164.8 oz

## 2018-01-24 LAB — MAGNESIUM: Magnesium: 2.1 mg/dL (ref 1.7–2.4)

## 2018-01-24 MED ORDER — FAMOTIDINE 20 MG PO TABS
20.0000 mg | ORAL_TABLET | Freq: Two times a day (BID) | ORAL | Status: DC
  Administered 2018-01-24 – 2018-01-27 (×6): 20 mg via ORAL
  Filled 2018-01-24 (×6): qty 1

## 2018-01-24 MED ORDER — ADULT MULTIVITAMIN W/MINERALS CH: 1.0000 | ORAL_TABLET | Freq: Every day | ORAL | Status: DC

## 2018-01-24 MED ORDER — PANTOPRAZOLE SODIUM 40 MG PO TBEC
40.0000 mg | DELAYED_RELEASE_TABLET | Freq: Two times a day (BID) | ORAL | Status: DC
  Administered 2018-01-24 – 2018-01-26 (×4): 40 mg via ORAL
  Filled 2018-01-24 (×4): qty 1

## 2018-01-24 MED ORDER — GUAIFENESIN ER 600 MG PO TB12
600.0000 mg | ORAL_TABLET | Freq: Two times a day (BID) | ORAL | Status: DC
  Administered 2018-01-24 – 2018-01-27 (×7): 600 mg via ORAL
  Filled 2018-01-24 (×7): qty 1

## 2018-01-24 MED ORDER — ENSURE ENLIVE PO LIQD
237.0000 mL | ORAL | Status: DC
  Administered 2018-01-25 – 2018-01-27 (×3): 237 mL via ORAL

## 2018-01-24 MED ORDER — BOOST / RESOURCE BREEZE PO LIQD CUSTOM
1.0000 | ORAL | Status: DC
  Administered 2018-01-26: 1 via ORAL

## 2018-01-24 NOTE — Progress Notes (Signed)
PROGRESS NOTE    Stacey [name]  ONG:[id number] DOB: Oct 03, 1940 DOA: 01/22/2018 PCP: Shon Baton, MD   Brief Narrative:  Patient 77 year old female history of rheumatoid arthritis presenting to ED with worsening shortness of breath.  Patient seen in pulmonary office 4 days prior to admission given shot of steroids and doxycycline however with no significant improvement presented to the ED with cough, hypoxemia.  Chest x-ray is consistent with interstitial lung disease.  Pulmonary consulted and are following.   Assessment & Plan:   Principal Problem:   Dyspnea Active Problems:   Arthritis, rheumatoid (HCC)   Asthma   ILD (interstitial lung disease) ([place])  1 dyspnea/hypoxemia/ILD Question of the bowel etiology.  Patient with cough.  Patient with complaints of awakening with cough and also with oral intake.  Concern for possible aspiration versus worsening interstitial lung disease versus bronchitis.  Also possibility of a cardiac etiology.  2D echo pending.  Continue IV steroid taper, IV Levaquin, nebulizer treatments, PPI, Pulmicort, Pepcid, Flonase, Claritin.  Mucinex added to regimen.  Will discontinue Tessalon Perles.  Speech therapy ordered per pulmonary for evaluation for aspiration.  Continue current regimen.  Pulmonary following.  2.  Gastroesophageal reflux disease Continue PPI and H2 blocker.  3.  Rheumatoid arthritis Stable.  Outpatient follow-up.  4.  Anxiety Stable.  Continue current regimen of Effexor.   DVT prophylaxis: Lovenox Code Status: Full Family Communication: Updated patient and daughter at bedside. Disposition Plan: Home when clinically improved and per pulmonary.   Consultants:   Pulmonary: Dr. Nelda Marseille 01/23/2018  Procedures:  Chest x-ray 01/22/2018  2D echo 01/24/2018 pending  Antimicrobials:   IV Levaquin 01/23/2018   Subjective: Patient laying in bed.  States some improvement with coughing.  Patient states woke up this morning coughing  incessantly.  Patient does endorse ongoing coughing with oral intake prior to admission leading her to being on a soft diet.  Denies any chest pain.  Objective: Vitals:   01/23/18 2121 01/24/18 0458 01/24/18 0500 01/24/18 0916  BP: (!) 170/81  (!) 143/69   Pulse: 79  (!) 103   Resp: 16  20   Temp: 99 F (37.2 C)  98.2 F (36.8 C)   TempSrc: Oral  Oral   SpO2: 95%  93% 97%  Weight:  61.4 kg (135 lb 4.8 oz)    Height:        Intake/Output Summary (Last 24 hours) at 01/24/2018 1111 Last data filed at 01/24/2018 0200 Gross per 24 hour  Intake 70 ml  Output -  Net 70 ml   Filed Weights   01/23/18 0357 01/23/18 1142 01/24/18 0458  Weight: 61.9 kg (136 lb 7.4 oz) 61.9 kg (136 lb 7.4 oz) 61.4 kg (135 lb 4.8 oz)    Examination:  General exam: Appears calm and comfortable  Respiratory system: Bibasilar crackles.  No wheezing.  Normal respiratory effort.  Cardiovascular system: Regular rate and rhythm no murmurs rubs or gallops.  No JVD.  No lower extremity edema.   Gastrointestinal system: Abdomen is soft, nontender, nondistended, positive bowel sounds.  No rebound.  No guarding. Central nervous system: Alert and oriented. No focal neurological deficits. Extremities: Symmetric 5 x 5 power. Skin: No rashes, lesions or ulcers Psychiatry: Judgement and insight appear normal. Mood & affect appropriate.     Data Reviewed: I have personally reviewed following labs and imaging studies  CBC: Recent Labs  Lab 01/22/18 2357 01/23/18 0817 01/24/18 0826  WBC 10.3 7.4 10.4  NEUTROABS 8.5*  --  8.3*  HGB 12.2 11.6* 12.5  HCT 36.9 36.0 38.6  MCV 91.1 91.6 92.3  PLT 328 317 591   Basic Metabolic Panel: Recent Labs  Lab 01/22/18 2357 01/23/18 0817 01/24/18 0826  NA 141 136 135  K 3.8 4.1 4.0  CL 103 100 96*  CO2 28 26 28   GLUCOSE 134* 163* 152*  BUN 17 15 20   CREATININE 0.66 0.55 0.79  CALCIUM 9.1 8.6* 8.7*  MG  --   --  2.1   GFR: Estimated Creatinine Clearance: 54.3  mL/min (by C-G formula based on SCr of 0.79 mg/dL). Liver Function Tests: Recent Labs  Lab 01/23/18 0817  AST 21  ALT 16  ALKPHOS 50  BILITOT 0.5  PROT 6.3*  ALBUMIN 3.0*   No results for input(s): LIPASE, AMYLASE in the last 168 hours. No results for input(s): AMMONIA in the last 168 hours. Coagulation Profile: No results for input(s): INR, PROTIME in the last 168 hours. Cardiac Enzymes: Recent Labs  Lab 01/22/18 2357  TROPONINI <0.03   BNP (last 3 results) No results for input(s): PROBNP in the last 8760 hours. HbA1C: No results for input(s): HGBA1C in the last 72 hours. CBG: No results for input(s): GLUCAP in the last 168 hours. Lipid Profile: No results for input(s): CHOL, HDL, LDLCALC, TRIG, CHOLHDL, LDLDIRECT in the last 72 hours. Thyroid Function Tests: No results for input(s): TSH, T4TOTAL, FREET4, T3FREE, THYROIDAB in the last 72 hours. Anemia Panel: No results for input(s): VITAMINB12, FOLATE, FERRITIN, TIBC, IRON, RETICCTPCT in the last 72 hours. Sepsis Labs: No results for input(s): PROCALCITON, LATICACIDVEN in the last 168 hours.  No results found for this or any previous visit (from the past 240 hour(s)).       Radiology Studies: Dg Chest 2 View  Result Date: 01/23/2018 CLINICAL DATA:  Shortness of breath. Cough for 1 week. History of asthma. EXAM: CHEST - 2 VIEW COMPARISON:  Radiographs 01/20/2018 and 01/10/2018. Chest CT 11/06/2016. FINDINGS: The heart size and mediastinal contours are stable. There is diffuse aortic atherosclerosis. There is chronic lung disease with subpleural reticulation and architectural distortion. The interstitial prominence noted on the most recent study has improved. No superimposed airspace disease, pleural effusion or pneumothorax demonstrated. Telemetry leads overlie the chest. No acute osseous findings. 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 This  superimposed increased interstitial prominence seen on the study of 2 days ago appears improved, either due to technical factors or resolved superimposed edema. Electronically Signed   By: Richardean Sale M.D.   On: 01/23/2018 00:16        Scheduled Meds: . amitriptyline  25 mg Oral QHS  . budesonide (PULMICORT) nebulizer solution  0.5 mg Nebulization BID  . cholecalciferol  1,000 Units Oral Daily  . enoxaparin (LOVENOX) injection  40 mg Subcutaneous Daily  . famotidine  20 mg Oral BID  . fluticasone  2 spray Each Nare Daily  . gabapentin  600 mg Oral QHS  . guaiFENesin  600 mg Oral BID  . ipratropium  0.5 mg Nebulization TID  . levalbuterol  0.63 mg Nebulization TID  . loratadine  10 mg Oral Daily  . losartan  25 mg Oral Daily  . methylPREDNISolone (SOLU-MEDROL) injection  80 mg Intravenous Q8H  . multivitamin  1 tablet Oral Daily  . pantoprazole  40 mg Oral BID  . sodium chloride flush  3 mL Intravenous Q12H  . venlafaxine XR  75 mg Oral QHS  Continuous Infusions: . sodium chloride    . levofloxacin (LEVAQUIN) IV Stopped (01/24/18 0749)     LOS: 1 day    Time spent: 35 minutes    Irine Seal, MD [facility] Pager [phone]  If 7PM-7AM, please contact night-coverage [email or web address] Password [email or web address] 01/24/2018, 11:11 AM

## 2018-01-24 NOTE — Progress Notes (Signed)
Name: Stacey [name] MRN: [id number] DOB: 24-Dec-1940    ADMISSION DATE:  01/22/2018 CONSULTATION DATE:  01/23/2018  REFERRING MD :  Nile Riggs [place]  CHIEF COMPLAINT:  DOE and SOB  BRIEF PATIENT DESCRIPTION: 77 year old female with history of rheumatoid arthritis who presents to the ED with increased DOE.  Patient was seen in the pulmonary office 4 days prior and was given a "shot" of steroids and doxycycline but now is feeling worse.  Presenting with cough, hypoxemia and CXR consistent with ILD.  Denies fever, chills, N/V or hemoptysis.  SIGNIFICANT EVENTS  7/13 admission for DOE  STUDIES:  CXR that I reviewed myself showing some evidence of ILD but very mild   HISTORY OF PRESENT ILLNESS:  77 year old female with history of rheumatoid arthritis who presents to the ED with increased DOE.  Patient was seen in the pulmonary office 4 days prior and was given a "shot" of steroids and doxycycline but now is feeling worse.  Presenting with cough, hypoxemia and CXR consistent with ILD.  Denies fever, chills, N/V or hemoptysis.  SUBJECTIVE:  Feels better now, had an episode of cough overnight that resolved when she sat up Also reports a lot of cough after eating  VITAL SIGNS: Temp:  [98.2 F (36.8 C)-99 F (37.2 C)] 98.2 F (36.8 C) (07/15 0500) Pulse Rate:  [79-103] 103 (07/15 0500) Resp:  [16-20] 20 (07/15 0500) BP: (117-170)/(61-81) 143/69 (07/15 0500) SpO2:  [93 %-97 %] 97 % (07/15 0916) Weight:  [135 lb 4.8 oz (61.4 kg)-136 lb 7.4 oz (61.9 kg)] 135 lb 4.8 oz (61.4 kg) (07/15 0458)  PHYSICAL EXAMINATION: General:  Chronically ill appearing female, NAD, on 3L [place] Neuro:  Alert and interactive, moving all ext to command HEENT:  [place]/AT, PERRL, EOM-I and MMM Cardiovascular:  RRR, Nl S1/S2 and -M/R/G Lungs:  No wheezes, bibasilar crackles Abdomen:  Soft, NT, ND and +BS Musculoskeletal:  -edema and -tenderness Skin:  Intact  Recent Labs  Lab 01/22/18 2357 01/23/18 0817  01/24/18 0826  NA 141 136 135  K 3.8 4.1 4.0  CL 103 100 96*  CO2 28 26 28   BUN 17 15 20   CREATININE 0.66 0.55 0.79  GLUCOSE 134* 163* 152*   Recent Labs  Lab 01/22/18 2357 01/23/18 0817 01/24/18 0826  HGB 12.2 11.6* 12.5  HCT 36.9 36.0 38.6  WBC 10.3 7.4 10.4  PLT 328 317 335    ASSESSMENT / PLAN:  77 year old female with diagnosis of ILD and RA, 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  PCCM consulted to assist with pulmonary management.  Discussed with PCCM-NP and TRH-MD.  Daughter updated bedside as well.  ?Dysphagia:  - SLP ordered  GERD:  - PPI to BID  - Sleep with the head of the bed up  Hypoxemia:  - Titrate O2 for sat of 88-92%  - Prior to discharge will need an ambulatory desat study evaluate for home O2 need  Cough: ?bronchitis  - Mucinex  - Add humabid BID  - Monitor clinically  - Steroids and levaquin as ordered  - IS  - Flutter valve  DOE: unsure if there is a cardiac component here, no echo on records  - BNP = 119, slightly elevated  - 2D echo pending  ILD:  - Solumedrol 80 mg IV q8  - Levaquin for airway sterilization  PCCM will follow with you.  Rush Farmer, M.D. [facility] Pulmonary/Critical Care  Medicine. Pager: [phone]. After hours pager: [phone].  01/24/2018, 9:51 AM

## 2018-01-24 NOTE — Evaluation (Signed)
Clinical/Bedside Swallow Evaluation Patient Details  Name: Stacey [name] MRN: [id number] Date of Birth: June 16, 1941  Today's Date: 01/24/2018 Time: SLP Start Time (ACUTE ONLY): 11 SLP Stop Time (ACUTE ONLY): 1410 SLP Time Calculation (min) (ACUTE ONLY): 15 min  Past Medical History:  Past Medical History:  Diagnosis Date  . Abnormal finding of blood chemistry   . Asthma   . H/O measles   . H/O varicella   . Hypertension   . 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 Lichen sclerosus 93/79/02   Asymptomatic  . Low iron   . Mitral valve prolapse   . Osteoarthritis   . Osteoporosis   . Post herpetic neuralgia   . Rheumatoid arthritis(714.0)   . Yeast infection    Past Surgical History:  Past Surgical History:  Procedure Laterality Date  . CHOLECYSTECTOMY  2011  . 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 Pt also described coughing associated with eating per pulmonology note.PMH also includes GERD, asthma.   Assessment / Plan / Recommendation Clinical Impression  Pt has intermittent baseline coughing that persists throughout intake, although it does not seem to immediately follow swallowing. Her vocal quality remains strong and clear, and her swallow appears to occur swiftly. She reports feeling like she started coughing at home over the past few weeks only while eating more solid foods, like bread. Question a possible esophageal component to her swallowing difficulties. SLP provided education about esophageal and aspiration precautions. Would continue with regular diet and thin liquids to allow pt to make selections for softer consistencies per preference. SLP will f/u briefly for tolerance versus need for any further testing. MD may wish to consider esophageal w/u as well. SLP Visit Diagnosis: Dysphagia, unspecified (R13.10)    Aspiration Risk  Mild aspiration risk    Diet Recommendation Regular;Thin liquid   Liquid Administration via: Straw;Cup Medication Administration: Whole meds with liquid Supervision: Patient able to self feed;Intermittent supervision to cue for compensatory strategies Compensations: Slow rate;Small sips/bites;Follow solids with liquid Postural Changes: Seated upright at 90 degrees;Remain upright for at least 30 minutes after po intake    Other  Recommendations Recommended Consults: Consider esophageal assessment Oral Care Recommendations: Oral care BID   Follow up Recommendations None      Frequency and Duration min 2x/week  1 week       Prognosis        Swallow Study   General HPI: Pt is a 77 year old female with diagnosis of ILD and RA, 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 Pt also described coughing associated with eating per pulmonology note.PMH also includes GERD, asthma. Type of Study: Bedside Swallow Evaluation Previous Swallow Assessment: none in chart Diet Prior to this Study: Regular;Thin liquids Temperature Spikes Noted: No Respiratory Status: Nasal cannula History of Recent Intubation: No Behavior/Cognition: Alert;Cooperative;Pleasant mood Oral Cavity Assessment: Within Functional Limits Oral Care Completed by SLP: No Oral Cavity - Dentition: Adequate natural dentition Vision: Functional for self-feeding Self-Feeding Abilities: Able to feed self Patient Positioning: Upright in bed Baseline Vocal Quality: Normal Volitional Cough: Strong Volitional Swallow: Able to elicit    Oral/Motor/Sensory Function Overall Oral Motor/Sensory Function: Within functional limits   Ice Chips Ice chips: Not tested   Thin Liquid Thin Liquid: Impaired Presentation: Cup;Self Fed;Straw Pharyngeal  Phase Impairments: Cough - Delayed  Nectar Thick Nectar Thick Liquid: Not tested   Honey Thick Honey Thick Liquid: Not tested   Puree Puree:  Impaired Presentation: Self Fed;Spoon Pharyngeal Phase Impairments: Cough - Delayed   Solid   GO   Solid: Impaired Presentation: Self Fed Pharyngeal Phase Impairments: Cough - Delayed        Stacey [name] 01/24/2018,2:31 PM  Stacey [name], M.A. CCC-SLP [phone]

## 2018-01-24 NOTE — Progress Notes (Signed)
Initial Nutrition Assessment  DOCUMENTATION CODES:   Not applicable  INTERVENTION:  - Will order Ensure Enlive once/day, this supplement provides 350 kcal and 20 grams of protein. - Will order Boost Breeze once/day, this supplement provides 250 kcal and 9 grams of protein. - Will order Magic Cup with dinner meals, this supplement provides 290 kcal and 9 grams of protein. - Will order daily multivitamin with minerals.  - Continue to encourage PO intakes.   NUTRITION DIAGNOSIS:   Inadequate oral intake related to acute illness, decreased appetite as evidenced by per patient/family report.  GOAL:   Patient will meet greater than or equal to 90% of their needs  MONITOR:   PO intake, Supplement acceptance, Weight trends, Labs  REASON FOR ASSESSMENT:   Malnutrition Screening Tool  ASSESSMENT:   77 y.o. female, with rheumatoid arthritis and asthma. She presented to the ED with c/o increase in dyspnea and dry cough. Patient notes recently being given steroid injection as an outpatient as well as being placed on doxycycline. This initially helped slightly but dyspnea has become worse.    BMI indicates normal weight. No intakes documented as patient was pending SLP evaluation which was done around 45-60 minutes ago. Recommendation is for regular, thin liquids.   Patient reports SOB for the past ~1 week which has gotten worse over time. There was a question of dysphagia which necessitated SLP evaluation. Patient with hx of RA which can affect ability to ambulate and to prepare foods.  No muscle or fat wasting noted at this time. Patient reports weight loss from UBW of 140 lbs over the past 1 week. This is consistent with what is in the chart, but also noted that this appears to have been a stated, rather than scaled, weight. Scaled weight on 5/31 indicates 6 lb weight loss (4% body weight) in the past 1.5 months. This is not significant for time frame. Scaled weight on 7/11 indicates 2 lb  weight loss (1.5% body weight) in the past 4 days. This is significant for time frame. Will continue to monitor weight trends closely.    Medications reviewed; 1000 units vitamin D/day, 20 mg oral Pepcid BID, 80 mg Solu-medrol TID, 1 tablet Prosight/day, 40 mg oral Protonix BID.  Labs reviewed; Cl: 96 mmol/L, Ca: 8.7 mg/dL.     NUTRITION - FOCUSED PHYSICAL EXAM:  Completed; no muscle and no fat wasting noted at this time.   Diet Order:   Diet Order           Diet Heart Room service appropriate? Yes; Fluid consistency: Thin  Diet effective now          EDUCATION NEEDS:   No education needs have been identified at this time  Skin:  Skin Assessment: Reviewed RN Assessment  Last BM:  7/13  Height:   Ht Readings from Last 1 Encounters:  01/23/18 5\' 6"  (1.676 m)    Weight:   Wt Readings from Last 1 Encounters:  01/24/18 135 lb 4.8 oz (61.4 kg)    Ideal Body Weight:  59.09 kg  BMI:  Body mass index is 21.84 kg/m.  Estimated Nutritional Needs:   Kcal:  1535-1720 (25-28 kcal/kg)  Protein:  60-70 grams  Fluid:  >/= 1.7 L/day     Jarome Matin, MS, RD, LDN, [facility] Inpatient Clinical Dietitian Pager # [phone] After hours/weekend pager # [phone]

## 2018-01-24 NOTE — Progress Notes (Signed)
  Echocardiogram 2D Echocardiogram has been performed.  Merrie Roof F 01/24/2018, 11:43 AM

## 2018-01-25 ENCOUNTER — Inpatient Hospital Stay (HOSPITAL_COMMUNITY): Payer: Medicare Other

## 2018-01-25 LAB — CBC
HCT: 37.9 % (ref 36.0–46.0)
Hemoglobin: 12.1 g/dL (ref 12.0–15.0)
MCH: 29.3 pg (ref 26.0–34.0)
MCHC: 31.9 g/dL (ref 30.0–36.0)
MCV: 91.8 fL (ref 78.0–100.0)
Platelets: 319 10*3/uL (ref 150–400)
RBC: 4.13 MIL/uL (ref 3.87–5.11)
RDW: 13.9 % (ref 11.5–15.5)
WBC: 8.9 10*3/uL (ref 4.0–10.5)

## 2018-01-25 LAB — BASIC METABOLIC PANEL
Anion gap: 7 (ref 5–15)
BUN: 21 mg/dL (ref 8–23)
CO2: 30 mmol/L (ref 22–32)
Calcium: 8.7 mg/dL — ABNORMAL LOW (ref 8.9–10.3)
Chloride: 100 mmol/L (ref 98–111)
Creatinine, Ser: 0.78 mg/dL (ref 0.44–1.00)
GFR calc Af Amer: >60 mL/min (ref >60)
GFR calc non Af Amer: >60 mL/min (ref >60)
Glucose, Bld: 130 mg/dL — ABNORMAL HIGH (ref 70–99)
Potassium: 4.3 mmol/L (ref 3.5–5.1)
Sodium: 137 mmol/L (ref 135–145)

## 2018-01-25 MED ORDER — SORBITOL 70 % SOLN: 30.0000 mL | Freq: Every day | Status: DC | PRN

## 2018-01-25 MED ORDER — METHYLPREDNISOLONE SODIUM SUCC 125 MG IJ SOLR
80.0000 mg | Freq: Three times a day (TID) | INTRAMUSCULAR | Status: AC
  Administered 2018-01-25 (×2): 80 mg via INTRAVENOUS
  Filled 2018-01-25 (×2): qty 2

## 2018-01-25 MED ORDER — POLYETHYLENE GLYCOL 3350 17 G PO PACK
17.0000 g | PACK | Freq: Two times a day (BID) | ORAL | Status: DC
  Administered 2018-01-25 – 2018-01-27 (×5): 17 g via ORAL
  Filled 2018-01-25 (×5): qty 1

## 2018-01-25 MED ORDER — NYSTATIN 100000 UNIT/ML MT SUSP
5.0000 mL | Freq: Four times a day (QID) | OROMUCOSAL | Status: DC
  Administered 2018-01-25 – 2018-01-27 (×9): 500000 [IU] via ORAL
  Filled 2018-01-25 (×9): qty 5

## 2018-01-25 MED ORDER — FUROSEMIDE 10 MG/ML IJ SOLN
40.0000 mg | Freq: Once | INTRAMUSCULAR | Status: AC
  Administered 2018-01-25: 40 mg via INTRAVENOUS
  Filled 2018-01-25: qty 4

## 2018-01-25 MED ORDER — PREDNISONE 20 MG PO TABS
40.0000 mg | ORAL_TABLET | Freq: Every day | ORAL | Status: DC
  Administered 2018-01-26 – 2018-01-27 (×2): 40 mg via ORAL
  Filled 2018-01-25 (×2): qty 2

## 2018-01-25 NOTE — Progress Notes (Signed)
PROGRESS NOTE    Stacey [name]  GGY:[id number] DOB: 1941/05/26 DOA: 01/22/2018 PCP: Shon Baton, MD   Brief Narrative:  Patient 77 year old female history of rheumatoid arthritis presenting to ED with worsening shortness of breath.  Patient seen in pulmonary office 4 days prior to admission given shot of steroids and doxycycline however with no significant improvement presented to the ED with cough, hypoxemia.  Chest x-ray is consistent with interstitial lung disease.  Pulmonary consulted and are following.   Assessment & Plan:   Principal Problem:   Dyspnea Active Problems:   Arthritis, rheumatoid (HCC)   Asthma   ILD (interstitial lung disease) ([place])   Chronic lung disease   Hypoxia  1 dyspnea/hypoxemia/ILD Question of etiology.  Concerned that likely GERD.  Patient with cough.  Patient with complaints of awakening with cough and also with oral intake which is slightly improved since 01/24/2018.Marland Kitchen  Concern for possible aspiration versus worsening interstitial lung disease versus bronchitis.  Also possibility of a cardiac etiology.  Patient has been assessed by speech therapy and speech therapy raising concern of esophageal component but no signs of aspiration.  2D echo with a EF of 55 to 60% with no wall motion abnormalities, grade 1 diastolic dysfunction.  Some clinical improvement.  Continue IV steroid taper, IV Levaquin, nebulizer treatments, PPI, Pulmicort, Pepcid, Flonase, Claritin,  Mucinex.  Esophagram ordered per pulmonary to rule out reflux and stricture.  Pulmonary following.   2.  Gastroesophageal reflux disease Continue PPI and H2 blocker.  3.  Rheumatoid arthritis Outpatient follow-up.    4.  Anxiety Continue Effexor.     DVT prophylaxis: Lovenox Code Status: Full Family Communication: Updated patient and daughter at bedside. Disposition Plan: Home when clinically improved and per pulmonary.   Consultants:   Pulmonary: Dr. Nelda Marseille  01/23/2018  Procedures:  Chest x-ray 01/22/2018  2D echo 01/24/2018  Antimicrobials:   IV Levaquin 01/23/2018   Subjective: Patient sitting up in bed.  States some improvement in coughing and shortness of breath since admission.  Denies any chest pain.    Objective: Vitals:   01/24/18 2119 01/24/18 2120 01/25/18 0500 01/25/18 0739  BP:   (!) 154/84   Pulse:   78 71  Resp:    18  Temp:   98.3 F (36.8 C)   TempSrc:   Oral   SpO2: 96% 96%  96%  Weight:   61.7 kg (136 lb)   Height:        Intake/Output Summary (Last 24 hours) at 01/25/2018 1044 Last data filed at 01/25/2018 0853 Gross per 24 hour  Intake 460 ml  Output -  Net 460 ml   Filed Weights   01/23/18 1142 01/24/18 0458 01/25/18 0500  Weight: 61.9 kg (136 lb 7.4 oz) 61.4 kg (135 lb 4.8 oz) 61.7 kg (136 lb)    Examination:  General exam: NAD Respiratory system: Bibasilar crackles.  No rhonchi.  No wheezing.  Normal respiratory effort.  Cardiovascular system: RRR no murmurs rubs or gallops.  No JVD.  No lower extremity edema.  Gastrointestinal system: Abdomen is nontender, nondistended, soft, positive bowel sounds.  No rebound.  No guarding.   Central nervous system: Alert and oriented. No focal neurological deficits. Extremities: Symmetric 5 x 5 power. Skin: No rashes, lesions or ulcers Psychiatry: Judgement and insight appear normal. Mood & affect appropriate.     Data Reviewed: I have personally reviewed following labs and imaging studies  CBC: Recent Labs  Lab 01/22/18 2357 01/23/18 0817 01/24/18  3267 01/25/18 0450  WBC 10.3 7.4 10.4 8.9  NEUTROABS 8.5*  --  8.3*  --   HGB 12.2 11.6* 12.5 12.1  HCT 36.9 36.0 38.6 37.9  MCV 91.1 91.6 92.3 91.8  PLT 328 317 335 124   Basic Metabolic Panel: Recent Labs  Lab 01/22/18 2357 01/23/18 0817 01/24/18 0826 01/25/18 0450  NA 141 136 135 137  K 3.8 4.1 4.0 4.3  CL 103 100 96* 100  CO2 28 26 28 30   GLUCOSE 134* 163* 152* 130*  BUN 17 15 20 21    CREATININE 0.66 0.55 0.79 0.78  CALCIUM 9.1 8.6* 8.7* 8.7*  MG  --   --  2.1  --    GFR: Estimated Creatinine Clearance: 54.3 mL/min (by C-G formula based on SCr of 0.78 mg/dL). Liver Function Tests: Recent Labs  Lab 01/23/18 0817  AST 21  ALT 16  ALKPHOS 50  BILITOT 0.5  PROT 6.3*  ALBUMIN 3.0*   No results for input(s): LIPASE, AMYLASE in the last 168 hours. No results for input(s): AMMONIA in the last 168 hours. Coagulation Profile: No results for input(s): INR, PROTIME in the last 168 hours. Cardiac Enzymes: Recent Labs  Lab 01/22/18 2357  TROPONINI <0.03   BNP (last 3 results) No results for input(s): PROBNP in the last 8760 hours. HbA1C: No results for input(s): HGBA1C in the last 72 hours. CBG: No results for input(s): GLUCAP in the last 168 hours. Lipid Profile: No results for input(s): CHOL, HDL, LDLCALC, TRIG, CHOLHDL, LDLDIRECT in the last 72 hours. Thyroid Function Tests: No results for input(s): TSH, T4TOTAL, FREET4, T3FREE, THYROIDAB in the last 72 hours. Anemia Panel: No results for input(s): VITAMINB12, FOLATE, FERRITIN, TIBC, IRON, RETICCTPCT in the last 72 hours. Sepsis Labs: No results for input(s): PROCALCITON, LATICACIDVEN in the last 168 hours.  No results found for this or any previous visit (from the past 240 hour(s)).       Radiology Studies: No results found.      Scheduled Meds: . amitriptyline  25 mg Oral QHS  . budesonide (PULMICORT) nebulizer solution  0.5 mg Nebulization BID  . cholecalciferol  1,000 Units Oral Daily  . enoxaparin (LOVENOX) injection  40 mg Subcutaneous Daily  . famotidine  20 mg Oral BID  . feeding supplement  1 Container Oral Q24H  . feeding supplement (ENSURE ENLIVE)  237 mL Oral Q24H  . fluticasone  2 spray Each Nare Daily  . gabapentin  600 mg Oral QHS  . guaiFENesin  600 mg Oral BID  . ipratropium  0.5 mg Nebulization TID  . levalbuterol  0.63 mg Nebulization TID  . loratadine  10 mg Oral Daily   . losartan  25 mg Oral Daily  . methylPREDNISolone (SOLU-MEDROL) injection  80 mg Intravenous Q8H  . multivitamin  1 tablet Oral Daily  . pantoprazole  40 mg Oral BID  . polyethylene glycol  17 g Oral BID  . sodium chloride flush  3 mL Intravenous Q12H  . venlafaxine XR  75 mg Oral QHS   Continuous Infusions: . sodium chloride    . levofloxacin (LEVAQUIN) IV Stopped (01/25/18 0741)     LOS: 2 days    Time spent: 35 minutes    Irine Seal, MD [facility] Pager [phone]  If 7PM-7AM, please contact night-coverage [email or web address] Password [facility] 01/25/2018, 10:44 AM

## 2018-01-25 NOTE — Progress Notes (Signed)
   Name: Stacey [name] MRN: [id number] DOB: 10-Oct-1940    ADMISSION DATE:  01/22/2018 CONSULTATION DATE:  01/23/2018  REFERRING MD :  Nile Riggs [place] Silos  CHIEF COMPLAINT:  DOE and SOB  BRIEF PATIENT DESCRIPTION: 77 year old female with history of rheumatoid arthritis who presents to the ED with increased DOE.  Patient was seen in the pulmonary office 4 days prior and was given a "shot" of steroids and doxycycline but now is feeling worse.  Presenting with cough, hypoxemia and CXR consistent with ILD.  Denies fever, chills, N/V or hemoptysis.  SIGNIFICANT EVENTS  7/13 admission for DOE  STUDIES:  CXR that I reviewed myself showing some evidence of ILD but very mild   SUBJECTIVE:  No distress.  Feels better   VITAL SIGNS: Temp:  [98.3 F (36.8 C)-98.4 F (36.9 C)] 98.3 F (36.8 C) (07/16 0500) Pulse Rate:  [71-82] 71 (07/16 0739) Resp:  [14-18] 18 (07/16 0739) BP: (151-154)/(73-84) 154/84 (07/16 0500) SpO2:  [92 %-100 %] 96 % (07/16 0739) Weight:  [136 lb (61.7 kg)] 136 lb (61.7 kg) (07/16 0500)  Intake/Output Summary (Last 24 hours) at 01/25/2018 0946 Last data filed at 01/25/2018 0853 Gross per 24 hour  Intake 460 ml  Output no documentation  Net 460 ml    PHYSICAL EXAMINATION: General: 77 year old female currently resting in bed no acute distress HEENT normocephalic atraumatic no jugular venous distention Pulmonary: Posterior rales no accessory use Cardiac: Regular rate and rhythm Abdomen: Soft nontender Extremities: Very areas of ecchymosis Neuro: Awake oriented no distress  Recent Labs  Lab 01/23/18 0817 01/24/18 0826 01/25/18 0450  NA 136 135 137  K 4.1 4.0 4.3  CL 100 96* 100  CO2 26 28 30   BUN 15 20 21   CREATININE 0.55 0.79 0.78  GLUCOSE 163* 152* 130*   Recent Labs  Lab 01/23/18 0817 01/24/18 0826 01/25/18 0450  HGB 11.6* 12.5 12.1  HCT 36.0 38.6 37.9  WBC 7.4 10.4 8.9  PLT 317 335 319    ASSESSMENT / PLAN:  Hypoxemia  H/o ILD and  RA GERD Cough Bronchitis r/o CAP Rule out dysphagia  Thrush  Discussion 77 year old female with diagnosis of ILD and RA, 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  PCCM consulted to assist with pulmonary management. -Solu-Medrol started 7/14 at 80 mg every 8 -Seen by SLP.  Has constant cough, SLP raising concern about esophageal component, but no aspiration -Echocardiogram obtained on 7/15: EF 55 to 60%.Wall motion normal.  Grade 1 diastolic dysfunction. -Portable chest x-ray personally reviewed: -feeling better. Still w/ cough   Plan Cont solumedrol at 80mg  tid thru today Cont reflux and aspiration precautions Adding nystatin for early thrush Wean oxygen  Cont PPI Day # 4/7 levaquin Lasix as BUN/cr and BP allow Esophagram r/o reflux and stricture  01/25/2018, 9:34 AM

## 2018-01-26 DIAGNOSIS — J849 Interstitial pulmonary disease, unspecified: Secondary | ICD-10-CM

## 2018-01-26 LAB — CBC
HCT: 39.7 % (ref 36.0–46.0)
Hemoglobin: 12.8 g/dL (ref 12.0–15.0)
MCH: 29.8 pg (ref 26.0–34.0)
MCHC: 32.2 g/dL (ref 30.0–36.0)
MCV: 92.3 fL (ref 78.0–100.0)
Platelets: 346 10*3/uL (ref 150–400)
RBC: 4.3 MIL/uL (ref 3.87–5.11)
RDW: 13.9 % (ref 11.5–15.5)
WBC: 9.1 10*3/uL (ref 4.0–10.5)

## 2018-01-26 LAB — BASIC METABOLIC PANEL
Anion gap: 10 (ref 5–15)
BUN: 20 mg/dL (ref 8–23)
CO2: 33 mmol/L — ABNORMAL HIGH (ref 22–32)
Calcium: 8.7 mg/dL — ABNORMAL LOW (ref 8.9–10.3)
Chloride: 97 mmol/L — ABNORMAL LOW (ref 98–111)
Creatinine, Ser: 0.76 mg/dL (ref 0.44–1.00)
GFR calc Af Amer: >60 mL/min (ref >60)
GFR calc non Af Amer: >60 mL/min (ref >60)
Glucose, Bld: 117 mg/dL — ABNORMAL HIGH (ref 70–99)
Potassium: 4.1 mmol/L (ref 3.5–5.1)
Sodium: 140 mmol/L (ref 135–145)

## 2018-01-26 LAB — MAGNESIUM: Magnesium: 2.4 mg/dL (ref 1.7–2.4)

## 2018-01-26 MED ORDER — METOCLOPRAMIDE HCL 5 MG PO TABS
5.0000 mg | ORAL_TABLET | Freq: Three times a day (TID) | ORAL | Status: DC
  Administered 2018-01-26 – 2018-01-27 (×4): 5 mg via ORAL
  Filled 2018-01-26 (×4): qty 1

## 2018-01-26 MED ORDER — DEXTROMETHORPHAN POLISTIREX ER 30 MG/5ML PO SUER
15.0000 mg | Freq: Every day | ORAL | Status: DC
  Administered 2018-01-26: 15 mg via ORAL
  Filled 2018-01-26: qty 5

## 2018-01-26 MED ORDER — PANTOPRAZOLE SODIUM 40 MG PO TBEC
40.0000 mg | DELAYED_RELEASE_TABLET | Freq: Two times a day (BID) | ORAL | Status: DC
  Administered 2018-01-26 – 2018-01-27 (×2): 40 mg via ORAL
  Filled 2018-01-26 (×2): qty 1

## 2018-01-26 MED ORDER — LEVOFLOXACIN 500 MG PO TABS
500.0000 mg | ORAL_TABLET | Freq: Every day | ORAL | Status: DC
  Administered 2018-01-26 – 2018-01-27 (×2): 500 mg via ORAL
  Filled 2018-01-26 (×2): qty 1

## 2018-01-26 NOTE — Plan of Care (Signed)
Pt's coughing severity has improved.

## 2018-01-26 NOTE — Progress Notes (Signed)
   Name: CASONDRA GASCA MRN: [id number] DOB: 07-Sep-1940    ADMISSION DATE:  01/22/2018 CONSULTATION DATE:  01/23/2018  REFERRING MD :  Nile Riggs [place] Silos  CHIEF COMPLAINT:  DOE and SOB  BRIEF PATIENT DESCRIPTION: 77 year old female with history of rheumatoid arthritis who presents to the ED with increased DOE.  Patient was seen in the pulmonary office 4 days prior and was given a "shot" of steroids and doxycycline but now is feeling worse.  Presenting with cough, hypoxemia and CXR consistent with ILD.  Denies fever, chills, N/V or hemoptysis.  SIGNIFICANT EVENTS  7/13 admission for DOE:   STUDIES:  CXR that I reviewed myself showing some evidence of ILD but very mild Esophagram obtained on 7/17:No laryngeal penetration or aspiration. Mild presbyesophagus. Mild smooth narrowing distal esophagus. 13 mm barium tablet becomes temporarily lodged at this level but clears immediately with subsequent swallowing. Spontaneous reflux to level of the midthoracic esophagus when patient is placed in the supine position. 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  SUBJECTIVE:  No distress.  Feels better.  Still apprehensive as she is waking up at night, with cough. VITAL SIGNS: Temp:  [97.8 F (36.6 C)-97.9 F (36.6 C)] 97.8 F (36.6 C) (07/17 0444) Pulse Rate:  [61-88] 75 (07/17 0912) Resp:  [12-18] 18 (07/17 0912) BP: (86-159)/(66-111) 149/71 (07/17 0557) SpO2:  [94 %-98 %] 96 % (07/17 0912) Weight:  [137 lb 3.2 oz (62.2 kg)] 137 lb 3.2 oz (62.2 kg) (07/17 0557)  Intake/Output Summary (Last 24 hours) at 01/26/2018 1332 Last data filed at 01/26/2018 0815 Gross per 24 hour  Intake 360 ml  Output 700 ml  Net -340 ml    PHYSICAL EXAMINATION: General 77 year old WF resting in bed HENT NCAT no JVD pulm bibasilar rales no accessory use Cardiac: Regular soft nontender Extremities: Brisk cap refill no edema Neuro: Awake oriented no focal  deficits   Recent Labs  Lab 01/24/18 0826 01/25/18 0450 01/26/18 0427  NA 135 137 140  K 4.0 4.3 4.1  CL 96* 100 97*  CO2 28 30 33*  BUN 20 21 20   CREATININE 0.79 0.78 0.76  GLUCOSE 152* 130* 117*   Recent Labs  Lab 01/24/18 0826 01/25/18 0450 01/26/18 0427  HGB 12.5 12.1 12.8  HCT 38.6 37.9 39.7  WBC 10.4 8.9 9.1  PLT 335 319 346    ASSESSMENT / PLAN:  Hypoxemia  H/o ILD and RA GERD Cough Bronchitis r/o CAP Rule out dysphagia  Thrush  Discussion 77 year old female with diagnosis of ILD and RA, 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  PCCM consulted to assist with pulmonary management. -Solu-Medrol started 7/14 at 80 mg every 8 -Seen by SLP.  Has constant cough, SLP raising concern about esophageal component, but no aspiration -Echocardiogram obtained on 7/15: EF 55 to 60%.Wall motion normal.  Grade 1 diastolic dysfunction. -Portable chest x-ray personally reviewed: -feeling better. 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   Plan Slow pred taper over 3 weeks back to baseline steroid dosing Cont reflux precautions Day 5/7 levaquin Cont nystatin  Cont PPI Wean oxygen  Add reglan ac HS Suspect can go home soon if we can manage cough at [place] ACNP-BC [place] Pager # [phone] OR # [phone] if no answer  01/26/2018, 1:32 PM

## 2018-01-26 NOTE — Care Management Important Message (Signed)
Important Message  Patient Details  Name: DELYNDA SEPULVEDA MRN: [id number] Date of Birth: [birth year]   Medicare Important Message Given:  Yes    Kerin Salen 01/26/2018, 12:05 [place] Message  Patient Details  Name: GESSICA JAWAD MRN: [id number] Date of Birth: 25-Jul-1940   Medicare Important Message Given:  Yes    Kerin Salen 01/26/2018, 12:05 PM

## 2018-01-26 NOTE — Progress Notes (Signed)
PROGRESS NOTE    Stacey [name]  YKD:[id number] DOB: 25-Mar-1941 DOA: 01/22/2018 PCP: Shon Baton, MD   Brief Narrative:  Patient 77 year old female history of rheumatoid arthritis presenting to ED with worsening shortness of breath.  Patient seen in pulmonary office 4 days prior to admission given shot of steroids and doxycycline however with no significant improvement presented to the ED with cough, hypoxemia.  Chest x-ray is consistent with interstitial lung disease.  Pulmonary consulted and are following.   Assessment & Plan:   Principal Problem:   Dyspnea Active Problems:   Arthritis, rheumatoid (HCC)   Asthma   ILD (interstitial lung disease) ([place])   Chronic lung disease   Hypoxia  1 dyspnea/hypoxemia/ILD Question of etiology.  Concerned that likely GERD.  Patient with cough.  Patient with complaints of awakening with cough and also with oral intake which is slightly improved since 01/24/2018.Marland Kitchen  Concern for possible aspiration versus worsening interstitial lung disease versus bronchitis.  Also possibility of a cardiac etiology.  Patient has been assessed by speech therapy and speech therapy raising concern of esophageal component but no signs of aspiration.  2D echo with a EF of 55 to 60% with no wall motion abnormalities, grade 1 diastolic dysfunction.  Some clinical improvement.  Continue IV steroid taper, IV Levaquin, nebulizer treatments, PPI, Pulmicort, Pepcid, Flonase, Claritin,  Mucinex.  Esophagram suggest mild presbyesophagus, pulmonary is recommending scheduled Reglan.  I placed the patient on scheduled Delsym at night.  2.  Gastroesophageal reflux disease Continue PPI and H2 blocker.  I changed PPI to [facility].  3.  Rheumatoid arthritis Outpatient follow-up.    4.  Anxiety Continue Effexor.     DVT prophylaxis: Lovenox Code Status: Full Family Communication: Updated patient and daughter at bedside. Disposition Plan: Home when clinically improved and per  pulmonary.   Consultants:   Pulmonary: Dr. Nelda Marseille 01/23/2018  Procedures:  Chest x-ray 01/22/2018  2D echo 01/24/2018  Antimicrobials:   IV Levaquin 01/23/2018   Subjective: Patient sitting up in bed.  Continues to have coughing spells at night.  No nausea no vomiting no fever no chills.  Breathing is better.  Objective: Vitals:   01/26/18 0557 01/26/18 0912 01/26/18 1416 01/26/18 1418  BP: (!) 149/71  130/60   Pulse:  75 72 71  Resp:  18 18 17   Temp:   98.6 F (37 C)   TempSrc:   Oral   SpO2:  96% 92% 92%  Weight: 62.2 kg (137 lb 3.2 oz)     Height:        Intake/Output Summary (Last 24 hours) at 01/26/2018 1800 Last data filed at 01/26/2018 1400 Gross per 24 hour  Intake 693.33 ml  Output -  Net 693.33 ml   Filed Weights   01/24/18 0458 01/25/18 0500 01/26/18 0557  Weight: 61.4 kg (135 lb 4.8 oz) 61.7 kg (136 lb) 62.2 kg (137 lb 3.2 oz)    Examination:  General exam: NAD Respiratory system: Bibasilar crackles.  No rhonchi.  No wheezing.  Normal respiratory effort.  Cardiovascular system: RRR no murmurs rubs or gallops.  No JVD.  No lower extremity edema.  Gastrointestinal system: Abdomen is nontender, nondistended, soft, positive bowel sounds.  No rebound.  No guarding.   Central nervous system: Alert and oriented. No focal neurological deficits. Extremities: Symmetric 5 x 5 power. Skin: No rashes, lesions or ulcers Psychiatry: Judgement and insight appear normal. Mood & affect appropriate.     Data Reviewed: I have personally reviewed following  labs and imaging studies  CBC: Recent Labs  Lab 01/22/18 2357 01/23/18 0817 01/24/18 0826 01/25/18 0450 01/26/18 0427  WBC 10.3 7.4 10.4 8.9 9.1  NEUTROABS 8.5*  --  8.3*  --   --   HGB 12.2 11.6* 12.5 12.1 12.8  HCT 36.9 36.0 38.6 37.9 39.7  MCV 91.1 91.6 92.3 91.8 92.3  PLT 328 317 335 319 245   Basic Metabolic Panel: Recent Labs  Lab 01/22/18 2357 01/23/18 0817 01/24/18 0826 01/25/18 0450  01/26/18 0427  NA 141 136 135 137 140  K 3.8 4.1 4.0 4.3 4.1  CL 103 100 96* 100 97*  CO2 28 26 28 30  33*  GLUCOSE 134* 163* 152* 130* 117*  BUN 17 15 20 21 20   CREATININE 0.66 0.55 0.79 0.78 0.76  CALCIUM 9.1 8.6* 8.7* 8.7* 8.7*  MG  --   --  2.1  --  2.4   GFR: Estimated Creatinine Clearance: 54.3 mL/min (by C-G formula based on SCr of 0.76 mg/dL). Liver Function Tests: Recent Labs  Lab 01/23/18 0817  AST 21  ALT 16  ALKPHOS 50  BILITOT 0.5  PROT 6.3*  ALBUMIN 3.0*   No results for input(s): LIPASE, AMYLASE in the last 168 hours. No results for input(s): AMMONIA in the last 168 hours. Coagulation Profile: No results for input(s): INR, PROTIME in the last 168 hours. Cardiac Enzymes: Recent Labs  Lab 01/22/18 2357  TROPONINI <0.03   BNP (last 3 results) No results for input(s): PROBNP in the last 8760 hours. HbA1C: No results for input(s): HGBA1C in the last 72 hours. CBG: No results for input(s): GLUCAP in the last 168 hours. Lipid Profile: No results for input(s): CHOL, HDL, LDLCALC, TRIG, CHOLHDL, LDLDIRECT in the last 72 hours. Thyroid Function Tests: No results for input(s): TSH, T4TOTAL, FREET4, T3FREE, THYROIDAB in the last 72 hours. Anemia Panel: No results for input(s): VITAMINB12, FOLATE, FERRITIN, TIBC, IRON, RETICCTPCT in the last 72 hours. Sepsis Labs: No results for input(s): PROCALCITON, LATICACIDVEN in the last 168 hours.  No results found for this or any previous visit (from the past 240 hour(s)).       Radiology Studies: Dg Esophagus  Result Date: 01/25/2018 CLINICAL DATA:  77 year old female with continued cough. Question esophageal component. Initial encounter. EXAM: ESOPHOGRAM / BARIUM SWALLOW / BARIUM TABLET STUDY TECHNIQUE: Combined double contrast and single contrast examination performed using effervescent crystals, thick barium liquid, and thin barium liquid. The patient was observed with fluoroscopy swallowing a 13 mm barium  sulphate tablet. FLUOROSCOPY TIME:  Fluoroscopy Time:  1 minutes and 42 seconds. Radiation Exposure Index: 8.6 mGy. COMPARISON:  01/25/2018 chest x-ray.  11/01/2015 PET-CT. FINDINGS: No laryngeal penetration or aspiration. Slightly blunted primary esophageal stripping wave. Mild smooth narrowing distal esophagus. 13 mm barium tablet becomes temporarily lodged at this level but clears immediately with subsequent swallowing. Spontaneous reflux to level of the midthoracic esophagus when patient is placed in the supine position. No esophageal mucosa abnormality noted. 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 IMPRESSION: No laryngeal penetration or aspiration. Mild presbyesophagus. Mild smooth narrowing distal esophagus. 13 mm barium tablet becomes temporarily lodged at this level but clears immediately with subsequent swallowing. Spontaneous reflux to level of the midthoracic esophagus when patient is placed in the supine position. 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 Electronically Signed   By: Genia Del M.D.   On: 01/25/2018 16:07   Dg Chest [facility]  1 View  Result Date: 01/25/2018 CLINICAL DATA:  Shortness of breath.  Interstitial lung disease. EXAM: PORTABLE CHEST 1 VIEW COMPARISON:  Chest x-ray 01/22/2018, 09/06/2017, 10/22/2016. CT 11/06/2016. FINDINGS: Mediastinum and hilar structures normal. Heart size normal. Stable bilateral pleuroparenchymal thickening noted consistent with scarring. No acute infiltrate. No pleural effusion or pneumothorax. No acute bony abnormality. IMPRESSION: Stable bilateral pleural-parenchymal thickening consistent with scarring. Electronically Signed   By: [place]   On: 01/25/2018 11:19        Scheduled Meds: . amitriptyline  25 mg Oral QHS  . budesonide (PULMICORT) nebulizer solution  0.5 mg Nebulization BID  . cholecalciferol  1,000 Units Oral Daily  .  dextromethorphan  15 mg Oral QHS  . enoxaparin (LOVENOX) injection  40 mg Subcutaneous Daily  . famotidine  20 mg Oral BID  . feeding supplement  1 Container Oral Q24H  . feeding supplement (ENSURE ENLIVE)  237 mL Oral Q24H  . fluticasone  2 spray Each Nare Daily  . gabapentin  600 mg Oral QHS  . guaiFENesin  600 mg Oral BID  . ipratropium  0.5 mg Nebulization TID  . levalbuterol  0.63 mg Nebulization TID  . loratadine  10 mg Oral Daily  . losartan  25 mg Oral Daily  . metoCLOPramide  5 mg Oral TID AC & HS  . multivitamin  1 tablet Oral Daily  . nystatin  5 mL Oral QID  . pantoprazole  40 mg Oral BID  . polyethylene glycol  17 g Oral BID  . predniSONE  40 mg Oral Q breakfast  . sodium chloride flush  3 mL Intravenous Q12H  . venlafaxine XR  75 mg Oral QHS   Continuous Infusions: . sodium chloride    . levofloxacin (LEVAQUIN) IV Stopped (01/26/18 0958)     LOS: 3 days    Time spent: 35 minutes    Berle Mull, MD [facility]  If 7PM-7AM, please contact night-coverage [email or web address] Password [email or web address] 01/26/2018, 6:00 PM

## 2018-01-27 DIAGNOSIS — R0602 Shortness of breath: Secondary | ICD-10-CM

## 2018-01-27 LAB — CBC WITH DIFFERENTIAL/PLATELET
Basophils Absolute: 0 10*3/uL (ref 0.0–0.1)
Basophils Relative: 0 %
Eosinophils Absolute: 0.1 10*3/uL (ref 0.0–0.7)
Eosinophils Relative: 1 %
HCT: 39.1 % (ref 36.0–46.0)
Hemoglobin: 12.6 g/dL (ref 12.0–15.0)
Lymphocytes Relative: 33 %
Lymphs Abs: 3.3 10*3/uL (ref 0.7–4.0)
MCH: 29.8 pg (ref 26.0–34.0)
MCHC: 32.2 g/dL (ref 30.0–36.0)
MCV: 92.4 fL (ref 78.0–100.0)
Monocytes Absolute: 1 10*3/uL (ref 0.1–1.0)
Monocytes Relative: 10 %
Neutro Abs: 5.7 10*3/uL (ref 1.7–7.7)
Neutrophils Relative %: 56 %
Platelets: 298 10*3/uL (ref 150–400)
RBC: 4.23 MIL/uL (ref 3.87–5.11)
RDW: 14.1 % (ref 11.5–15.5)
WBC: 10 10*3/uL (ref 4.0–10.5)

## 2018-01-27 LAB — COMPREHENSIVE METABOLIC PANEL
ALT: 29 U/L (ref 0–44)
AST: 22 U/L (ref 15–41)
Albumin: 2.6 g/dL — ABNORMAL LOW (ref 3.5–5.0)
Alkaline Phosphatase: 39 U/L (ref 38–126)
Anion gap: 11 (ref 5–15)
BUN: 19 mg/dL (ref 8–23)
CO2: 29 mmol/L (ref 22–32)
Calcium: 8.3 mg/dL — ABNORMAL LOW (ref 8.9–10.3)
Chloride: 98 mmol/L (ref 98–111)
Creatinine, Ser: 0.65 mg/dL (ref 0.44–1.00)
GFR calc Af Amer: >60 mL/min (ref >60)
GFR calc non Af Amer: >60 mL/min (ref >60)
Glucose, Bld: 90 mg/dL (ref 70–99)
Potassium: 4.1 mmol/L (ref 3.5–5.1)
Sodium: 138 mmol/L (ref 135–145)
Total Bilirubin: 0.6 mg/dL (ref 0.3–1.2)
Total Protein: 5.3 g/dL — ABNORMAL LOW (ref 6.5–8.1)

## 2018-01-27 LAB — MAGNESIUM: Magnesium: 2.4 mg/dL (ref 1.7–2.4)

## 2018-01-27 MED ORDER — METOCLOPRAMIDE HCL 5 MG PO TABS: 5.0000 mg | ORAL_TABLET | Freq: Three times a day (TID) | ORAL | 0 refills | Status: DC

## 2018-01-27 MED ORDER — GUAIFENESIN ER 600 MG PO TB12: 600.0000 mg | ORAL_TABLET | Freq: Two times a day (BID) | ORAL | 0 refills | Status: DC

## 2018-01-27 MED ORDER — HYDROCODONE-HOMATROPINE 5-1.5 MG/5ML PO SYRP: 2.5000 mL | ORAL_SOLUTION | Freq: Four times a day (QID) | ORAL | 0 refills | Status: DC | PRN

## 2018-01-27 MED ORDER — METHYLPREDNISOLONE 8 MG PO TABS: 8.0000 mg | ORAL_TABLET | Freq: Every day | ORAL | 0 refills | Status: DC

## 2018-01-27 MED ORDER — POLYETHYLENE GLYCOL 3350 17 G PO PACK: 17.0000 g | PACK | Freq: Every day | ORAL | 0 refills | Status: DC

## 2018-01-27 MED ORDER — LEVALBUTEROL HCL 0.63 MG/3ML IN NEBU: 0.6300 mg | INHALATION_SOLUTION | Freq: Three times a day (TID) | RESPIRATORY_TRACT | 12 refills | Status: DC

## 2018-01-27 MED ORDER — NYSTATIN 100000 UNIT/ML MT SUSP: 5.0000 mL | Freq: Four times a day (QID) | OROMUCOSAL | 0 refills | Status: AC

## 2018-01-27 MED ORDER — PREDNISONE 10 MG PO TABS: ORAL_TABLET | ORAL | 0 refills | Status: DC

## 2018-01-27 MED ORDER — DEXTROMETHORPHAN POLISTIREX ER 30 MG/5ML PO SUER
15.0000 mg | Freq: Every day | ORAL | 0 refills | Status: DC
Start: 2018-01-27 — End: 2020-01-18

## 2018-01-27 MED ORDER — FLUTICASONE PROPIONATE 50 MCG/ACT NA SUSP: 2.0000 | Freq: Every day | NASAL | 0 refills | Status: DC

## 2018-01-27 MED ORDER — FAMOTIDINE 20 MG PO TABS: 20.0000 mg | ORAL_TABLET | Freq: Two times a day (BID) | ORAL | 0 refills | Status: DC

## 2018-01-27 MED ORDER — ENSURE ENLIVE PO LIQD: 237.0000 mL | ORAL | 12 refills | Status: DC

## 2018-01-27 MED ORDER — IPRATROPIUM BROMIDE 0.02 % IN SOLN: 0.5000 mg | Freq: Three times a day (TID) | RESPIRATORY_TRACT | 12 refills | Status: DC

## 2018-01-27 MED ORDER — PANTOPRAZOLE SODIUM 40 MG PO TBEC: 40.0000 mg | DELAYED_RELEASE_TABLET | Freq: Two times a day (BID) | ORAL | 0 refills | Status: DC

## 2018-01-27 MED ORDER — LEVOFLOXACIN 500 MG PO TABS: 500.0000 mg | ORAL_TABLET | Freq: Every day | ORAL | 0 refills | Status: AC

## 2018-01-27 MED ORDER — LORATADINE 10 MG PO TABS
10.0000 mg | ORAL_TABLET | Freq: Every day | ORAL | 0 refills | Status: DC
Start: 2018-01-28 — End: 2018-03-21

## 2018-01-27 NOTE — Progress Notes (Signed)
  Speech Language Pathology Treatment: Dysphagia  Patient Details Name: NIKKO QUAST MRN: [id number] DOB: 1941/07/07 Today's Date: 01/27/2018 Time: 1050-1105 SLP Time Calculation (min) (ACUTE ONLY): 15 min  Assessment / Plan / Recommendation Clinical Impression  Pt provided with reflux/esophageal/xerostomia precautions in writing and verbally using teach back.  Pt for follow up with ENT per [facility].  All education completed to help mitigate pt's esophageal dysphagia and reinforced that pt demonstrated sensory deficits to reflux on esophagram therefore compliance with strategies is important.  No SLP follow up indicated.  Thanks!      HPI HPI: Pt is a 77 year old female with diagnosis of ILD and RA, 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 Pt also described coughing associated with eating per pulmonology note.PMH also includes GERD, asthma.      SLP Plan  All goals met       Recommendations  Diet recommendations: Regular;Thin liquid Liquids provided via: Cup;Straw Medication Administration: Whole meds with liquid Supervision: Patient able to self feed Compensations: Slow rate;Small sips/bites;Follow solids with liquid Postural Changes and/or Swallow Maneuvers: Seated upright 90 degrees;Upright 30-60 min after meal                Oral Care Recommendations: Oral care BID Follow up Recommendations: None SLP Visit Diagnosis: Dysphagia, unspecified (R13.10) Plan: All goals met       GO                Macario Golds 01/27/2018, 11:31 AM [name], [facility] SLP [phone]

## 2018-01-27 NOTE — Progress Notes (Signed)
Name: Stacey [name] MRN: [id number] DOB: [birth year]    ADMISSION DATE:  01/22/2018 CONSULTATION DATE:  01/23/2018  REFERRING MD :  Nile Riggs [place]  CHIEF COMPLAINT:  DOE and SOB  BRIEF PATIENT DESCRIPTION: 77 year old female with history of rheumatoid arthritis who presents to the ED with increased DOE.  Patient was seen in the pulmonary office 4 days prior and was given a "shot" of steroids and doxycycline but now is feeling worse.  Presenting with cough, hypoxemia and CXR consistent with ILD.  Denies fever, chills, N/V or hemoptysis.  SIGNIFICANT EVENTS  7/13 admission for DOE:   STUDIES:  CXR that I reviewed myself showing some evidence of ILD but very mild Esophagram obtained on 7/17:No laryngeal penetration or aspiration. Mild presbyesophagus. Mild smooth narrowing distal esophagus. 13 mm barium tablet becomes temporarily lodged at this level but clears immediately with subsequent swallowing. Spontaneous reflux to level of the midthoracic esophagus when patient is placed in the supine position. 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  SUBJECTIVE:  Looks better  Ready to go home   VITAL SIGNS: Temp:  [98.6 F (37 C)] 98.6 F (37 C) (07/17 2136) Pulse Rate:  [68-72] 68 (07/18 0527) Resp:  [17-20] 20 (07/18 0527) BP: (129-135)/(60-76) 129/76 (07/18 0527) SpO2:  [91 %-94 %] 94 % (07/18 0852) Weight:  [137 lb 12.8 oz (62.5 kg)] 137 lb 12.8 oz (62.5 kg) (07/18 0527)  Intake/Output Summary (Last 24 hours) at 01/27/2018 1145 Last data filed at 01/26/2018 1800 Gross per 24 hour  Intake 480 ml  Output no documentation  Net 480 ml    PHYSICAL EXAMINATION: General 77 year old white female resting in bed HENT NCAT no JVD MMM pulm bibasilar rales no accessory use  Card RRR abd soft not tender  Ext brisk CR no edema scattered areas of ecchymosis    Recent Labs  Lab 01/25/18 0450 01/26/18 0427 01/27/18 0445  NA 137  140 138  K 4.3 4.1 4.1  CL 100 97* 98  CO2 30 33* 29  BUN 21 20 19   CREATININE 0.78 0.76 0.65  GLUCOSE 130* 117* 90   Recent Labs  Lab 01/25/18 0450 01/26/18 0427 01/27/18 0445  HGB 12.1 12.8 12.6  HCT 37.9 39.7 39.1  WBC 8.9 9.1 10.0  PLT 319 346 298    ASSESSMENT / PLAN:  Hypoxemia  H/o ILD and RA GERD Cough Bronchitis r/o CAP Rule out dysphagia  Thrush  Discussion 77 year old female with diagnosis of ILD and RA, 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  PCCM consulted to assist with pulmonary management. -Solu-Medrol started 7/14 at 80 mg every 8 -Seen by SLP.  Has constant cough, SLP raising concern about esophageal component, but no aspiration -Echocardiogram obtained on 7/15: EF 55 to 60%.Wall motion normal.  Grade 1 diastolic dysfunction. -Portable chest x-ray personally reviewed: -feeling better. 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  7/18 feeling better. No cough last night since adding the reglan  Plan Slow pred taper; decrease by 10mg  q 5d. Completed can resume medrol dosing Cont reflux precautions Would send her home on nystatin to complete total of 10d Cont PPI BID Cont reglan ac/hs Walking oximetry before dc Cont reflux precautions Will place her f/u appointment in computer wed 24 at 3pm w/ [facility]  Consider ENT eval as out pt for upper airway  cough component   Erick Colace ACNP-BC [place] Pager # [phone] OR # [phone] if no answer  01/27/2018, 11:45 AM

## 2018-01-27 NOTE — Discharge Instructions (Signed)
Gastroesophageal Reflux Disease, Adult Normally, food travels down the esophagus and stays in the stomach to be digested. However, when a person has gastroesophageal reflux disease (GERD), food and stomach acid move back up into the esophagus. When this happens, the esophagus becomes sore and inflamed. Over time, GERD can create small holes (ulcers) in the lining of the esophagus. What are the causes? This condition is caused by a problem with the muscle between the esophagus and the stomach (lower esophageal sphincter, or LES). Normally, the LES muscle closes after food passes through the esophagus to the stomach. When the LES is weakened or abnormal, it does not close properly, and that allows food and stomach acid to go back up into the esophagus. The LES can be weakened by certain dietary substances, medicines, and medical conditions, including:  Tobacco use.  Pregnancy.  Having a hiatal hernia.  Heavy alcohol use.  Certain foods and beverages, such as coffee, chocolate, onions, and peppermint.  What increases the risk? This condition is more likely to develop in:  People who have an increased body weight.  People who have connective tissue disorders.  People who use NSAID medicines.  What are the signs or symptoms? Symptoms of this condition include:  Heartburn.  Difficult or painful swallowing.  The feeling of having a lump in the throat.  Abitter taste in the mouth.  Bad breath.  Having a large amount of saliva.  Having an upset or bloated stomach.  Belching.  Chest pain.  Shortness of breath or wheezing.  Ongoing (chronic) cough or a night-time cough.  Wearing away of tooth enamel.  Weight loss.  Different conditions can cause chest pain. Make sure to see your health care provider if you experience chest pain. How is this diagnosed? Your health care provider will take a medical history and perform a physical exam. To determine if you have mild or severe  GERD, your health care provider may also monitor how you respond to treatment. You may also have other tests, including:  An endoscopy toexamine your stomach and esophagus with a small camera.  A test thatmeasures the acidity level in your esophagus.  A test thatmeasures how much pressure is on your esophagus.  A barium swallow or modified barium swallow to show the shape, size, and functioning of your esophagus.  How is this treated? The goal of treatment is to help relieve your symptoms and to prevent complications. Treatment for this condition may vary depending on how severe your symptoms are. Your health care provider may recommend:  Changes to your diet.  Medicine.  Surgery.  Follow these instructions at home: Diet  Follow a diet as recommended by your health care provider. This may involve avoiding foods and drinks such as: ? Coffee and tea (with or without caffeine). ? Drinks that containalcohol. ? Energy drinks and sports drinks. ? Carbonated drinks or sodas. ? Chocolate and cocoa. ? Peppermint and mint flavorings. ? Garlic and onions. ? Horseradish. ? Spicy and acidic foods, including peppers, chili powder, curry powder, vinegar, hot sauces, and barbecue sauce. ? Citrus fruit juices and citrus fruits, such as oranges, lemons, and limes. ? Tomato-based foods, such as red sauce, chili, salsa, and pizza with red sauce. ? Fried and fatty foods, such as donuts, french fries, potato chips, and high-fat dressings. ? High-fat meats, such as hot dogs and fatty cuts of red and white meats, such as rib eye steak, sausage, ham, and bacon. ? High-fat dairy items, such as whole milk,   butter, and cream cheese.  Eat small, frequent meals instead of large meals.  Avoid drinking large amounts of liquid with your meals.  Avoid eating meals during the 2-3 hours before bedtime.  Avoid lying down right after you eat.  Do not exercise right after you eat. General  instructions  Pay attention to any changes in your symptoms.  Take over-the-counter and prescription medicines only as told by your health care provider. Do not take aspirin, ibuprofen, or other NSAIDs unless your health care provider told you to do so.  Do not use any tobacco products, including cigarettes, chewing tobacco, and e-cigarettes. If you need help quitting, ask your health care provider.  Wear loose-fitting clothing. Do not wear anything tight around your waist that causes pressure on your abdomen.  Raise (elevate) the head of your bed 6 inches (15cm).  Try to reduce your stress, such as with yoga or meditation. If you need help reducing stress, ask your health care provider.  If you are overweight, reduce your weight to an amount that is healthy for you. Ask your health care provider for guidance about a safe weight loss goal.  Keep all follow-up visits as told by your health care provider. This is important. Contact a health care provider if:  You have new symptoms.  You have unexplained weight loss.  You have difficulty swallowing, or it hurts to swallow.  You have wheezing or a persistent cough.  Your symptoms do not improve with treatment.  You have a hoarse voice. Get help right away if:  You have pain in your arms, neck, jaw, teeth, or back.  You feel sweaty, dizzy, or light-headed.  You have chest pain or shortness of breath.  You vomit and your vomit looks like blood or coffee grounds.  You faint.  Your stool is bloody or black.  You cannot swallow, drink, or eat. This information is not intended to replace advice given to you by your health care provider. Make sure you discuss any questions you have with your health care provider. Document Released: 04/08/2005 Document Revised: 11/27/2015 Document Reviewed: 10/24/2014 Elsevier Interactive Patient Education  2018 [employer]  

## 2018-01-31 ENCOUNTER — Telehealth: Payer: Self-pay | Admitting: Primary Care

## 2018-01-31 ENCOUNTER — Encounter: Payer: Self-pay | Admitting: Primary Care

## 2018-01-31 ENCOUNTER — Ambulatory Visit (INDEPENDENT_AMBULATORY_CARE_PROVIDER_SITE_OTHER): Payer: Medicare Other | Admitting: Primary Care

## 2018-01-31 DIAGNOSIS — J849 Interstitial pulmonary disease, unspecified: Secondary | ICD-10-CM | POA: Diagnosis not present

## 2018-01-31 DIAGNOSIS — J45991 Cough variant asthma: Secondary | ICD-10-CM | POA: Diagnosis not present

## 2018-01-31 NOTE — Telephone Encounter (Signed)
Please make sure hosp records from most recent stay were faxed to patient's rheumatologist Dr. Eda Paschal at [facility] per patient request.

## 2018-01-31 NOTE — Assessment & Plan Note (Signed)
-   Improved; Continue GERD RX, gabapentin, delsym, Claritin, mucinex and prednisone. PRN hycodan at bedtime.  - FU in October with Dr. Melvyn Novas

## 2018-01-31 NOTE — Assessment & Plan Note (Signed)
-   [facility] admission from 7/13-7/17 for worsening cough and sob - Feeling better, cough is nearly gone. Reports sleeping well past 3 nights.  - CXR showing mild ILD - Treated with levaquin 500mg  x 5days and extended prednisone taper - Continues ipratropium and levalbuterol neb, reports jitteriness. Ok to change xopenex to prn only

## 2018-01-31 NOTE — Progress Notes (Signed)
@Patient  ID: Stacey [name], female    DOB: [birth year], 77 y.o.   MRN: [id number]  Chief Complaint  Patient presents with  . Hospitalization Follow-up    d/c on 01/27/18- pt states breathing has improved since being discharged. c/o wheezing, dry cough at times prod with green mucus mainly in the morning & mild sob with coughing spell    Referring provider: Shon Baton, MD  HPI: 77 year old female. Hx RA, asthma, HTN, mitral valve prolapse, osteoporosis, post herpetic neuralgia. Patient Dr. Melvyn Novas, last seen 7/8 & 7/11. Given medrol shot and doxycyline. Recent hospital admission for ILD, asthma exac from 7/13-7/17. Patient presented to ED for worsening cough and sob. CXR felt to be consistent with mild ILD. Received levaquin and extended prednisone course. Placed on ipratropium neb TID, levalubuterol neb TID, famotidine BID, ensure, delsym BID, mucinex BID.  01/31/2018 Presents today for hosp fu with her husband. Feels better today, continues prednisone taper. Completed levaquin. Cough has nearly gone away. Husband states that she was having coughing fits that would last for 45 mins and now she barely coughs. She has slept well for the past 2-3 nights. She complains of some jitteriness with prednisone and levalbuterol.    Allergies  Allergen Reactions  . Penicillins     Unknown Has patient had a PCN reaction causing immediate rash, facial/tongue/throat swelling, SOB or lightheadedness with hypotension: Unknown Has patient had a PCN reaction causing severe rash involving mucus membranes or skin necrosis: Unknown Has patient had a PCN reaction that required hospitalization: No Has patient had a PCN reaction occurring within the last 10 years: No If all of the above answers are "NO", then may proceed with Cephalosporin use.   . Remicade [Infliximab]   . Sulfa Antibiotics     Unknown reaction    Immunization History  Administered Date(s) Administered  . Influenza Split 04/12/2013,  04/12/2014, 03/14/2015, 04/23/2016  . Influenza, High Dose Seasonal PF 04/12/2017  . Influenza-Unspecified 04/03/2013, 04/22/2017  . Pneumococcal Conjugate-13 04/03/2013, 09/28/2014  . Pneumococcal Polysaccharide-23 07/13/2012, 07/24/2013  . Tdap 12/10/2017    Past Medical History:  Diagnosis Date  . Abnormal finding of blood chemistry   . Asthma   . H/O measles   . H/O varicella   . Hypertension   . 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 Lichen sclerosus 40/98/11   Asymptomatic  . Low iron   . Mitral valve prolapse   . Osteoarthritis   . Osteoporosis   . Post herpetic neuralgia   . Rheumatoid arthritis(714.0)   . Yeast infection     Tobacco History: Social History   Tobacco Use  Smoking Status Never Smoker  Smokeless Tobacco Never Used   Counseling given: Not Answered   Outpatient Medications Prior to Visit  Medication Sig Dispense Refill  . abatacept (ORENCIA) 250 MG injection Inject into the vein every 30 (thirty) days.     Marland Kitchen albuterol (PROVENTIL) (2.5 MG/3ML) 0.083% nebulizer solution Take 3 mLs (2.5 mg total) by nebulization every 4 (four) hours as needed for wheezing or shortness of breath (with flutter  (dx: J45.991)). 125 mL 0  . amitriptyline (ELAVIL) 25 MG tablet Take 25 mg by mouth at bedtime.  0  . beta carotene w/minerals (OCUVITE) tablet Take 1 tablet by mouth daily.    . cholecalciferol (VITAMIN D) 1000 UNITS tablet Take 1,000 Units by mouth daily.    Marland Kitchen dextromethorphan (DELSYM) 30 MG/5ML liquid Take 2.5 mLs (15 mg total) by mouth at bedtime. 89 mL  0  . famotidine (PEPCID) 20 MG tablet Take 1 tablet (20 mg total) by mouth 2 (two) times daily. 60 tablet 0  . feeding supplement, ENSURE ENLIVE, (ENSURE ENLIVE) LIQD Take 237 mLs by mouth daily. 237 mL 12  . gabapentin (NEURONTIN) 300 MG capsule Take 600 mg by mouth at bedtime.    Marland Kitchen guaiFENesin (MUCINEX) 600 MG 12 hr tablet Take 1 tablet (600 mg total) by mouth 2 (two) times daily. 60 tablet 0  .  HYDROcodone-homatropine (HYCODAN) 5-1.5 MG/5ML syrup Take 2.5 mLs by mouth every 6 (six) hours as needed for cough. 120 mL 0  . ipratropium (ATROVENT) 0.02 % nebulizer solution Take 2.5 mLs (0.5 mg total) by nebulization 3 (three) times daily. 75 mL 12  . levalbuterol (XOPENEX) 0.63 MG/3ML nebulizer solution Take 3 mLs (0.63 mg total) by nebulization 3 (three) times daily. 3 mL 12  . loratadine (CLARITIN) 10 MG tablet Take 1 tablet (10 mg total) by mouth daily. 30 tablet 0  . losartan (COZAAR) 25 MG tablet Take 25 mg by mouth daily.  11  . metoCLOPramide (REGLAN) 5 MG tablet Take 1 tablet (5 mg total) by mouth 3 (three) times daily before meals. 15 tablet 0  . nystatin (MYCOSTATIN) 100000 UNIT/ML suspension Take 5 mLs (500,000 Units total) by mouth 4 (four) times daily for 9 days. 60 mL 0  . pantoprazole (PROTONIX) 40 MG tablet Take 1 tablet (40 mg total) by mouth 2 (two) times daily before a meal. 60 tablet 0  . polyethylene glycol (MIRALAX / GLYCOLAX) packet Take 17 g by mouth daily. 14 each 0  . predniSONE (DELTASONE) 10 MG tablet Take 40mg  daily for 5days,Take 30mg  daily for 5days,Take 20mg  daily for 5days,Take 10mg  daily for 5days, then switch to medrol 50 tablet 0  . Respiratory Therapy Supplies (FLUTTER) DEVI Use as directed 1 each 0  . venlafaxine XR (EFFEXOR-XR) 75 MG 24 hr capsule Take 75 mg by mouth at bedtime.     . fluticasone (FLONASE) 50 MCG/ACT nasal spray Place 2 sprays into both nostrils daily. 16 g 0  . methylPREDNISolone (MEDROL) 8 MG tablet Take 1 tablet (8 mg total) by mouth daily. Start only after prednisone is completed. 30 tablet 0  . methylPREDNISolone (MEDROL) 8 MG tablet Take 8 mg by mouth daily. Take after complete with prednisone     No facility-administered medications prior to visit.       Review of Systems  Review of Systems  HENT: Negative.   Respiratory: Positive for wheezing. Negative for cough and shortness of breath.   Cardiovascular: Negative.     Genitourinary: Negative.   Neurological: Negative.      Physical Exam  BP 122/76 (BP Location: Left Arm, Cuff Size: Normal)   Pulse 80   Ht 5\' 6"  (1.676 m)   Wt 136 lb (61.7 kg)   SpO2 93%   BMI 21.95 kg/m  Physical Exam  Constitutional: She is oriented to person, place, and time.  Thin elderly woman   HENT:  Head: Normocephalic and atraumatic.  Eyes: Pupils are equal, round, and reactive to light. EOM are normal.  Neck: Normal range of motion. Neck supple.  Cardiovascular: Normal rate and regular rhythm.  No murmur heard. Pulmonary/Chest: Effort normal. She has wheezes. She has rales.  Abdominal: Soft. Bowel sounds are normal. There is no tenderness.  Neurological: She is alert and oriented to person, place, and time.  Skin: Skin is warm and dry. No rash noted. No erythema.  Psychiatric: She has a normal mood and affect. Her behavior is normal. Judgment normal.     Lab Results:  CBC    Component Value Date/Time   WBC 10.0 01/27/2018 0445   RBC 4.23 01/27/2018 0445   HGB 12.6 01/27/2018 0445   HCT 39.1 01/27/2018 0445   PLT 298 01/27/2018 0445   MCV 92.4 01/27/2018 0445   MCH 29.8 01/27/2018 0445   MCHC 32.2 01/27/2018 0445   RDW 14.1 01/27/2018 0445   LYMPHSABS 3.3 01/27/2018 0445   MONOABS 1.0 01/27/2018 0445   EOSABS 0.1 01/27/2018 0445   BASOSABS 0.0 01/27/2018 0445    BMET    Component Value Date/Time   NA 138 01/27/2018 0445   K 4.1 01/27/2018 0445   CL 98 01/27/2018 0445   CO2 29 01/27/2018 0445   GLUCOSE 90 01/27/2018 0445   BUN 19 01/27/2018 0445   CREATININE 0.65 01/27/2018 0445   CALCIUM 8.3 (L) 01/27/2018 0445   GFRNONAA >60 01/27/2018 0445   GFRAA >60 01/27/2018 0445    BNP    Component Value Date/Time   BNP 116.2 (H) 01/23/2018 1300    ProBNP No results found for: PROBNP  Imaging: Dg Chest 2 View  Result Date: 01/23/2018 CLINICAL DATA:  Shortness of breath. Cough for 1 week. History of asthma. EXAM: CHEST - 2 VIEW  COMPARISON:  Radiographs 01/20/2018 and 01/10/2018. Chest CT 11/06/2016. FINDINGS: The heart size and mediastinal contours are stable. There is diffuse aortic atherosclerosis. There is chronic lung disease with subpleural reticulation and architectural distortion. The interstitial prominence noted on the most recent study has improved. No superimposed airspace disease, pleural effusion or pneumothorax demonstrated. Telemetry leads overlie the chest. No acute osseous findings. 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 This superimposed increased interstitial prominence seen on the study of 2 days ago appears improved, either due to technical factors or resolved superimposed edema. Electronically Signed   By: Richardean Sale M.D.   On: 01/23/2018 00:16   Dg Chest 2 View  Result Date: 01/21/2018 CLINICAL DATA:  Patient with cough and shortness of breath EXAM: CHEST - 2 VIEW COMPARISON:  Chest radiograph 01/10/2018 FINDINGS: Stable cardiac and mediastinal contours. Aortic atherosclerosis. Interval progression of coarse interstitial opacities bilaterally. No definite superimposed area of consolidation. No pleural effusion or pneumothorax. Thoracic spine degenerative changes. IMPRESSION: Bilateral coarse interstitial opacities appear more prominent than prior exam which may represent superimposed edema or atypical infection on chronic process. Electronically Signed   By: Lovey Newcomer M.D.   On: 01/21/2018 07:53   Dg Chest 2 View  Result Date: 01/10/2018 CLINICAL DATA:  Chronic dyspnea on exertion. EXAM: CHEST - 2 VIEW COMPARISON:  Radiographs of September 06, 2017. FINDINGS: Stable cardiomediastinal silhouette. Atherosclerosis of thoracic aorta is noted. No pneumothorax or pleural effusion is noted. Stable reticular densities are noted throughout both lungs most consistent with scarring or chronic interstitial lung disease. No new pulmonary  abnormality is noted. Bony thorax is unremarkable. IMPRESSION: Stable reticular densities are noted throughout both lungs most consistent with scarring or chronic interstitial lung disease. No acute abnormality is noted. Aortic Atherosclerosis (ICD10-I70.0). Electronically Signed   By: Marijo Conception, M.D.   On: 01/10/2018 16:46   Dg Esophagus  Result Date: 01/25/2018 CLINICAL DATA:  77 year old female with continued cough. Question esophageal component. Initial encounter. EXAM: ESOPHOGRAM / BARIUM SWALLOW / BARIUM TABLET STUDY TECHNIQUE: Combined double contrast and single contrast examination performed using effervescent crystals,  thick barium liquid, and thin barium liquid. The patient was observed with fluoroscopy swallowing a 13 mm barium sulphate tablet. FLUOROSCOPY TIME:  Fluoroscopy Time:  1 minutes and 42 seconds. Radiation Exposure Index: 8.6 mGy. COMPARISON:  01/25/2018 chest x-ray.  11/01/2015 PET-CT. FINDINGS: No laryngeal penetration or aspiration. Slightly blunted primary esophageal stripping wave. Mild smooth narrowing distal esophagus. 13 mm barium tablet becomes temporarily lodged at this level but clears immediately with subsequent swallowing. Spontaneous reflux to level of the midthoracic esophagus when patient is placed in the supine position. No esophageal mucosa abnormality noted. 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 IMPRESSION: No laryngeal penetration or aspiration. Mild presbyesophagus. Mild smooth narrowing distal esophagus. 13 mm barium tablet becomes temporarily lodged at this level but clears immediately with subsequent swallowing. Spontaneous reflux to level of the midthoracic esophagus when patient is placed in the supine position. 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 Electronically Signed   By: Genia Del M.D.   On: 01/25/2018 16:07   Dg Chest Port 1 View  Result Date:  01/25/2018 CLINICAL DATA:  Shortness of breath.  Interstitial lung disease. EXAM: PORTABLE CHEST 1 VIEW COMPARISON:  Chest x-ray 01/22/2018, 09/06/2017, 10/22/2016. CT 11/06/2016. FINDINGS: Mediastinum and hilar structures normal. Heart size normal. Stable bilateral pleuroparenchymal thickening noted consistent with scarring. No acute infiltrate. No pleural effusion or pneumothorax. No acute bony abnormality. IMPRESSION: Stable bilateral pleural-parenchymal thickening consistent with scarring. Electronically Signed   By: Marcello Moores  Register   On: 01/25/2018 11:19     Assessment & Plan:   ILD (interstitial lung disease) ([place]) - [facility] admission from 7/13-7/17 for worsening cough and sob - Feeling better, cough is nearly gone. Reports sleeping well past 3 nights.  - CXR showing mild ILD - Treated with levaquin 500mg  x 5days and extended prednisone taper - Continues ipratropium and levalbuterol neb, reports jitteriness. Ok to change xopenex to prn only   Cough variant asthma vs uacs  - Improved; Continue GERD RX, gabapentin, delsym, Claritin, mucinex and prednisone. PRN hycodan at bedtime.  - FU in October with Dr. Alethia Berthold, NP 01/31/2018

## 2018-01-31 NOTE — Patient Instructions (Addendum)
Continue protonix and pepcid (for GERD) Ok to stop reglan when finished RX  Continue prednisone taper until complete  Continue ipratropium neb three times day Change levalbuterol neb to as needed   Continue Claritin, mucinex and delsym cough syrup  As needed prescription cough medication at bedtime only   Please send hospital records to [facility] - fax [phone] Dr. Eda Paschal with Rheumatology   Next office visit on 10/2 with Dr. Melvyn Novas

## 2018-02-01 NOTE — Telephone Encounter (Signed)
All hosp records were faxed to Donneta Romberg, MD via epic.   Will route to [place] as an [place].

## 2018-02-01 NOTE — Progress Notes (Signed)
Chart and office note reviewed in detail  > agree with a/p as outlined    

## 2018-02-01 NOTE — Telephone Encounter (Signed)
error 

## 2018-02-02 ENCOUNTER — Other Ambulatory Visit: Payer: Self-pay

## 2018-02-02 ENCOUNTER — Inpatient Hospital Stay: Payer: Medicare Other | Admitting: Acute Care

## 2018-02-02 NOTE — Discharge Summary (Signed)
[facility] Discharge Summary   Patient: Stacey [name] MHD:[id number]   PCP: Shon Baton, MD DOB: Aug 28, 1940   Date of admission: 01/22/2018   Date of discharge: 01/27/2018    Discharge Diagnoses:  Principal Problem:   Dyspnea Active Problems:   Arthritis, rheumatoid ([place])   Asthma   ILD (interstitial lung disease) ([place])   Chronic lung disease   Hypoxia   Admitted From: home Disposition:  home  Recommendations for Outpatient Follow-up:  1. Please follow-up with PCP and pulmonary as recommended  Follow-up Information    Magdalen Spatz, NP Follow up on 02/02/2018.   Specialty:  Pulmonary Disease Why:  at 3pm  Contact information: [address] [place] [address] [phone]        Shon Baton, MD. Schedule an appointment as soon as possible for a visit in 1 week(s).   Specialty:  Internal Medicine Contact information: [address] [place] [address] [phone]          Diet recommendation: Cardiac diet  Activity: The patient is advised to gradually reintroduce usual activities.  Discharge Condition: good  Code Status: Full code  History of present illness: As per the H and P dictated on admission, " Stacey [name]  is a 77 y.o. female, w Rheumatoid arthritis, ILD Asthma, apparently c/o increase in dyspnea this evening. Dry cough.   Denies fever, chills, cp, palp,  N/v, diarrhea, brbpr, black stool.   Pt notes recently being given steroid injection in office as well as being placed on doxycycline. This might have helped slightly but pt worse    "  Hospital Course:  Summary of her active problems in the hospital is as following. 1 dyspnea/hypoxemia/ILD Concerned that likely GERD Is causing ILD. Patient with cough.   Patient with complaints of awakening with cough and also with oral intake which is slightly improved since 01/24/2018. assessed by speech therapy and speech therapy raising concern of esophageal component but no  signs of aspiration.   2D echo with a EF of 55 to 60% with no wall motion abnormalities, grade 1 diastolic dysfunction.  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 Pulmonary felt that the patient should be on scheduled Reglan. I have placed the patient on scheduled potassium before sleep. Continue steroids on discharge continue Mucinex and Claritin as well as inhalers. Patient will follow-up with pulmonary outpatient  2.  Gastroesophageal reflux disease Continue PPI and H2 blocker.  I changed PPI to [facility].  3.  Rheumatoid arthritis Outpatient follow-up.    4.  Anxiety Continue Effexor.     All other chronic medical condition were stable during the hospitalization.  Patient was ambulatory without any assistance. On the day of the discharge the patient's vitals were stable , and no other acute medical condition were reported by patient. the patient was felt safe to be discharge at home with family.  Consultants: PCCM  Procedures: Echocardiogram   DISCHARGE MEDICATION: Allergies as of 01/27/2018      Reactions   Penicillins    Unknown Has patient had a PCN reaction causing immediate rash, facial/tongue/throat swelling, SOB or lightheadedness with hypotension: Unknown Has patient had a PCN reaction causing severe rash involving mucus membranes or skin necrosis: Unknown Has patient had a PCN reaction that required hospitalization: No Has patient had a PCN reaction occurring within the last 10 years: No If all of the above answers are "NO", then may proceed with Cephalosporin use.   Remicade [infliximab]  Sulfa Antibiotics    Unknown reaction      Medication List    STOP taking these medications   acetaminophen-codeine 300-30 MG tablet Commonly known as:  TYLENOL #3   doxycycline 100 MG tablet Commonly known as:  VIBRA-TABS   methylPREDNISolone 4 MG tablet Commonly known as:  MEDROL   methylPREDNISolone 8 MG tablet Commonly  known as:  MEDROL   ranitidine 150 MG capsule Commonly known as:  ZANTAC Replaced by:  famotidine 20 MG tablet     TAKE these medications   albuterol (2.5 MG/3ML) 0.083% nebulizer solution Commonly known as:  PROVENTIL Take 3 mLs (2.5 mg total) by nebulization every 4 (four) hours as needed for wheezing or shortness of breath (with flutter  (dx: J45.991)).   amitriptyline 25 MG tablet Commonly known as:  ELAVIL Take 25 mg by mouth at bedtime.   beta carotene w/minerals tablet Take 1 tablet by mouth daily.   cholecalciferol 1000 units tablet Commonly known as:  VITAMIN D Take 1,000 Units by mouth daily.   dextromethorphan 30 MG/5ML liquid Commonly known as:  DELSYM Take 2.5 mLs (15 mg total) by mouth at bedtime.   famotidine 20 MG tablet Commonly known as:  PEPCID Take 1 tablet (20 mg total) by mouth 2 (two) times daily. Replaces:  ranitidine 150 MG capsule   feeding supplement (ENSURE ENLIVE) Liqd Take 237 mLs by mouth daily.   FLUTTER Devi Use as directed   gabapentin 300 MG capsule Commonly known as:  NEURONTIN Take 600 mg by mouth at bedtime.   guaiFENesin 600 MG 12 hr tablet Commonly known as:  MUCINEX Take 1 tablet (600 mg total) by mouth 2 (two) times daily.   HYDROcodone-homatropine 5-1.5 MG/5ML syrup Commonly known as:  HYCODAN Take 2.5 mLs by mouth every 6 (six) hours as needed for cough.   ipratropium 0.02 % nebulizer solution Commonly known as:  ATROVENT Take 2.5 mLs (0.5 mg total) by nebulization 3 (three) times daily.   levalbuterol 0.63 MG/3ML nebulizer solution Commonly known as:  XOPENEX Take 3 mLs (0.63 mg total) by nebulization 3 (three) times daily.   loratadine 10 MG tablet Commonly known as:  CLARITIN Take 1 tablet (10 mg total) by mouth daily.   losartan 25 MG tablet Commonly known as:  COZAAR Take 25 mg by mouth daily.   metoCLOPramide 5 MG tablet Commonly known as:  REGLAN Take 1 tablet (5 mg total) by mouth 3 (three) times  daily before meals.   nystatin 100000 UNIT/ML suspension Commonly known as:  MYCOSTATIN Take 5 mLs (500,000 Units total) by mouth 4 (four) times daily for 9 days.   ORENCIA 250 MG injection Generic drug:  abatacept Inject into the vein every 30 (thirty) days.   pantoprazole 40 MG tablet Commonly known as:  PROTONIX Take 1 tablet (40 mg total) by mouth 2 (two) times daily before a meal. What changed:  See the new instructions.   polyethylene glycol packet Commonly known as:  MIRALAX / GLYCOLAX Take 17 g by mouth daily.   predniSONE 10 MG tablet Commonly known as:  DELTASONE Take 40mg  daily for 5days,Take 30mg  daily for 5days,Take 20mg  daily for 5days,Take 10mg  daily for 5days, then switch to medrol   venlafaxine XR 75 MG 24 hr capsule Commonly known as:  EFFEXOR-XR Take 75 mg by mouth at bedtime.     ASK your doctor about these medications   levofloxacin 500 MG tablet Commonly known as:  LEVAQUIN Take 1 tablet (500 mg  total) by mouth daily for 1 day. Ask about: Should I take this medication?      Allergies  Allergen Reactions  . Penicillins     Unknown Has patient had a PCN reaction causing immediate rash, facial/tongue/throat swelling, SOB or lightheadedness with hypotension: Unknown Has patient had a PCN reaction causing severe rash involving mucus membranes or skin necrosis: Unknown Has patient had a PCN reaction that required hospitalization: No Has patient had a PCN reaction occurring within the last 10 years: No If all of the above answers are "NO", then may proceed with Cephalosporin use.   . Remicade [Infliximab]   . Sulfa Antibiotics     Unknown reaction   Discharge Instructions    Diet - low sodium heart healthy   Complete by:  As directed    Discharge instructions   Complete by:  As directed    It is important that you read following instructions as well as go over your medication list with RN to help you understand your care after this  hospitalization.  Discharge Instructions: lease follow-up with PCP in one week  Please request your primary care physician to go over all Hospital Tests and Procedure/Radiological results at the follow up,  Please get all Hospital records sent to your PCP by signing hospital release before you go home.   Do not drive, operating heavy machinery, perform activities at heights, swimming or participation in water activities or provide baby sitting services while you are on Pain, Sleep and Anxiety Medications; until you have been seen by Primary Care Physician or a Neurologist and advised to do so again. Do not take more than prescribed Pain, Sleep and Anxiety Medications. You were cared for by a hospitalist during your hospital stay. If you have any questions about your discharge medications or the care you received while you were in the hospital after you are discharged, you can call the unit and ask to speak with the hospitalist on call if the hospitalist that took care of you is not available.  Once you are discharged, your primary care physician will handle any further medical issues. Please note that NO REFILLS for any discharge medications will be authorized once you are discharged, as it is imperative that you return to your primary care physician (or establish a relationship with a primary care physician if you do not have one) for your aftercare needs so that they can reassess your need for medications and monitor your lab values. You Must read complete instructions/literature along with all the possible adverse reactions/side effects for all the Medicines you take and that have been prescribed to you. Take any new Medicines after you have completely understood and accept all the possible adverse reactions/side effects. Wear Seat belts while driving. If you have smoked or chewed Tobacco in the last 2 yrs please stop smoking and/or stop any Recreational drug use.   Increase activity slowly    Complete by:  As directed      Discharge Exam: Filed Weights   01/25/18 0500 01/26/18 0557 01/27/18 0527  Weight: 61.7 kg (136 lb) 62.2 kg (137 lb 3.2 oz) 62.5 kg (137 lb 12.8 oz)   Vitals:   01/27/18 0852 01/27/18 1428  BP:  116/62  Pulse:  82  Resp:  12  Temp:  98.2 F (36.8 C)  SpO2: 94% 90%   General: Appear in no distress, no Rash; Oral Mucosa moist. Cardiovascular: S1 and S2 Present, no Murmur, no JVD Respiratory: Bilateral Air entry present  and Clear to Auscultation, no Crackles, no wheezes Abdomen: Bowel Sound present, Soft and no tenderness Extremities: no Pedal edema, no calf tenderness Neurology: Grossly no focal neuro deficit.  The results of significant diagnostics from this hospitalization (including imaging, microbiology, ancillary and laboratory) are listed below for reference.    Significant Diagnostic Studies: Dg Chest 2 View  Result Date: 01/23/2018 CLINICAL DATA:  Shortness of breath. Cough for 1 week. History of asthma. EXAM: CHEST - 2 VIEW COMPARISON:  Radiographs 01/20/2018 and 01/10/2018. Chest CT 11/06/2016. FINDINGS: The heart size and mediastinal contours are stable. There is diffuse aortic atherosclerosis. There is chronic lung disease with subpleural reticulation and architectural distortion. The interstitial prominence noted on the most recent study has improved. No superimposed airspace disease, pleural effusion or pneumothorax demonstrated. Telemetry leads overlie the chest. No acute osseous findings. 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 This superimposed increased interstitial prominence seen on the study of 2 days ago appears improved, either due to technical factors or resolved superimposed edema. Electronically Signed   By: Richardean Sale M.D.   On: 01/23/2018 00:16   Dg Chest 2 View  Result Date: 01/21/2018 CLINICAL DATA:  Patient with cough and shortness of breath EXAM:  CHEST - 2 VIEW COMPARISON:  Chest radiograph 01/10/2018 FINDINGS: Stable cardiac and mediastinal contours. Aortic atherosclerosis. Interval progression of coarse interstitial opacities bilaterally. No definite superimposed area of consolidation. No pleural effusion or pneumothorax. Thoracic spine degenerative changes. IMPRESSION: Bilateral coarse interstitial opacities appear more prominent than prior exam which may represent superimposed edema or atypical infection on chronic process. Electronically Signed   By: Lovey Newcomer M.D.   On: 01/21/2018 07:53   Dg Chest 2 View  Result Date: 01/10/2018 CLINICAL DATA:  Chronic dyspnea on exertion. EXAM: CHEST - 2 VIEW COMPARISON:  Radiographs of September 06, 2017. FINDINGS: Stable cardiomediastinal silhouette. Atherosclerosis of thoracic aorta is noted. No pneumothorax or pleural effusion is noted. Stable reticular densities are noted throughout both lungs most consistent with scarring or chronic interstitial lung disease. No new pulmonary abnormality is noted. Bony thorax is unremarkable. IMPRESSION: Stable reticular densities are noted throughout both lungs most consistent with scarring or chronic interstitial lung disease. No acute abnormality is noted. Aortic Atherosclerosis (ICD10-I70.0). Electronically Signed   By: Marijo Conception, M.D.   On: 01/10/2018 16:46   Dg Esophagus  Result Date: 01/25/2018 CLINICAL DATA:  77 year old female with continued cough. Question esophageal component. Initial encounter. EXAM: ESOPHOGRAM / BARIUM SWALLOW / BARIUM TABLET STUDY TECHNIQUE: Combined double contrast and single contrast examination performed using effervescent crystals, thick barium liquid, and thin barium liquid. The patient was observed with fluoroscopy swallowing a 13 mm barium sulphate tablet. FLUOROSCOPY TIME:  Fluoroscopy Time:  1 minutes and 42 seconds. Radiation Exposure Index: 8.6 mGy. COMPARISON:  01/25/2018 chest x-ray.  11/01/2015 PET-CT. FINDINGS: No  laryngeal penetration or aspiration. Slightly blunted primary esophageal stripping wave. Mild smooth narrowing distal esophagus. 13 mm barium tablet becomes temporarily lodged at this level but clears immediately with subsequent swallowing. Spontaneous reflux to level of the midthoracic esophagus when patient is placed in the supine position. No esophageal mucosa abnormality noted. 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 IMPRESSION: No laryngeal penetration or aspiration. Mild presbyesophagus. Mild smooth narrowing distal esophagus. 13 mm barium tablet becomes temporarily lodged at this level but clears immediately with subsequent swallowing. Spontaneous reflux to level of the midthoracic esophagus when patient is  placed in the supine position. 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 Electronically Signed   By: Genia Del M.D.   On: 01/25/2018 16:07   Dg Chest Port 1 View  Result Date: 01/25/2018 CLINICAL DATA:  Shortness of breath.  Interstitial lung disease. EXAM: PORTABLE CHEST 1 VIEW COMPARISON:  Chest x-ray 01/22/2018, 09/06/2017, 10/22/2016. CT 11/06/2016. FINDINGS: Mediastinum and hilar structures normal. Heart size normal. Stable bilateral pleuroparenchymal thickening noted consistent with scarring. No acute infiltrate. No pleural effusion or pneumothorax. No acute bony abnormality. IMPRESSION: Stable bilateral pleural-parenchymal thickening consistent with scarring. Electronically Signed   By: Marcello Moores  Register   On: 01/25/2018 11:19    Microbiology: No results found for this or any previous visit (from the past 240 hour(s)).   Labs: CBC: Recent Labs  Lab 01/27/18 0445  WBC 10.0  NEUTROABS 5.7  HGB 12.6  HCT 39.1  MCV 92.4  PLT 848   Basic Metabolic Panel: Recent Labs  Lab 01/27/18 0445  NA 138  K 4.1  CL 98  CO2 29  GLUCOSE 90  BUN 19  CREATININE 0.65  CALCIUM 8.3*  MG 2.4    Liver Function Tests: Recent Labs  Lab 01/27/18 0445  AST 22  ALT 29  ALKPHOS 39  BILITOT 0.6  PROT 5.3*  ALBUMIN 2.6*   No results for input(s): LIPASE, AMYLASE in the last 168 hours. No results for input(s): AMMONIA in the last 168 hours. Cardiac Enzymes: No results for input(s): CKTOTAL, CKMB, CKMBINDEX, TROPONINI in the last 168 hours. BNP (last 3 results) Recent Labs    01/22/18 2357 01/23/18 1300  BNP 100.6* 116.2*   CBG: No results for input(s): GLUCAP in the last 168 hours. Time spent: 35 minutes  Signed:  Berle Mull  [facility] 01/27/2018 , 1:31 PM

## 2018-02-02 NOTE — Patient Outreach (Signed)
[facility]) Care Management  02/02/2018  Stacey [name] [birth year] [id number]  EMMI: general discharge Referral date: 02/02/18 Referral reason: lost interest in things: yes,   Sad/ hopeless/ empty/ anxious: yes Insurance: medicare Day #4  Telephone call to patient regarding EMMI general discharge red alert. HIPAA verified with patient. Explained reason for call. Patient states she is not having any feelings of anxiety or depression. Patient states she is doing ok but doesn't have a lot of energy.  Patient states she has had a follow up appointment with her pulmonologist since being discharged from the hospital.  Patient reports she has spoken with her primary MD office since discharged but they did not need to see her.  Patient states she has her medications and takes them as prescribed.  She reports she has transportation to her appointments.  Denies having any symptoms at this time. Patient denies any further needs or concerns.  RNCM advised patient to notify MD of any changes in condition prior to scheduled appointment. RNCM provided contact name and number: [phone] or main office number [phone] and 24 hour nurse advise line [phone] by mail.  RNCM verified patient aware of 911 services for urgent/ emergent needs.,   PLAN:  RNCM will close patient due to patient being assessed and having no further needs.  RNCM will send patient [facility] brochure/ magnet.  RNCM will send closure notification to patients primary MD.   Quinn Plowman RN,BSN,CCM [facility] Telephonic  [phone]

## 2018-02-10 DIAGNOSIS — M069 Rheumatoid arthritis, unspecified: Secondary | ICD-10-CM | POA: Diagnosis not present

## 2018-02-10 DIAGNOSIS — Z79899 Other long term (current) drug therapy: Secondary | ICD-10-CM | POA: Diagnosis not present

## 2018-02-23 DIAGNOSIS — R0609 Other forms of dyspnea: Secondary | ICD-10-CM | POA: Diagnosis not present

## 2018-02-23 DIAGNOSIS — M0579 Rheumatoid arthritis with rheumatoid factor of multiple sites without organ or systems involvement: Secondary | ICD-10-CM | POA: Diagnosis not present

## 2018-02-23 DIAGNOSIS — Z5181 Encounter for therapeutic drug level monitoring: Secondary | ICD-10-CM | POA: Diagnosis not present

## 2018-02-25 ENCOUNTER — Ambulatory Visit (INDEPENDENT_AMBULATORY_CARE_PROVIDER_SITE_OTHER): Payer: Medicare Other | Admitting: Primary Care

## 2018-02-25 ENCOUNTER — Encounter: Payer: Self-pay | Admitting: Primary Care

## 2018-02-25 VITALS — BP 130/70 | HR 94 | Ht 65.5 in | Wt 141.6 lb

## 2018-02-25 DIAGNOSIS — M069 Rheumatoid arthritis, unspecified: Secondary | ICD-10-CM

## 2018-02-25 DIAGNOSIS — J45909 Unspecified asthma, uncomplicated: Secondary | ICD-10-CM | POA: Diagnosis not present

## 2018-02-25 DIAGNOSIS — J849 Interstitial pulmonary disease, unspecified: Secondary | ICD-10-CM

## 2018-02-25 DIAGNOSIS — J984 Other disorders of lung: Secondary | ICD-10-CM

## 2018-02-25 DIAGNOSIS — J84112 Idiopathic pulmonary fibrosis: Secondary | ICD-10-CM

## 2018-02-25 DIAGNOSIS — J45991 Cough variant asthma: Secondary | ICD-10-CM

## 2018-02-25 LAB — NITRIC OXIDE: Nitric Oxide: 30

## 2018-02-25 NOTE — Assessment & Plan Note (Signed)
-   Seen by rheumatology on 08/14, there is consideration to change Orencia to Rituxin (looking for pulm input, I will check with Dr. Melvyn Novas).

## 2018-02-25 NOTE — Assessment & Plan Note (Addendum)
PFTs minimally changed from previous 01/10/18 - FVC 1.99 (67%), FEV1 1.55 (70%), Ratio 78, DLCOunc 16.09 (59%)

## 2018-02-25 NOTE — Assessment & Plan Note (Signed)
Repeat HRCT with IPF protocol

## 2018-02-25 NOTE — Patient Instructions (Addendum)
  1. Take atrovent three times a day   2. Take MEDROL 12mg  x 3 days, 8mg  x3 days then 4mg  daily continuous   3. Restart xopenex neb twice a day AFTER steriod taper completed   4. Back up is albuterol nebulizer every 4 hours as needed for wheezing   OK to stop mucinex - take as needed for chest congestion or loose secretions  Continue claritin daily  Continue delsym cough syrup 58ml during the day  Hycodan 2.10ml at bedtime for cough   * Please schedule HRCT in October with IPF protocol (prior to office visit with Dr. Melvyn Novas)  Will get back to you about Rituxan

## 2018-02-25 NOTE — Assessment & Plan Note (Addendum)
-   Significant insp/exp wheeze on exam. Appears comfortable, no resp distress. O2 sat 95% RA.  - FENO 30  - Medrol taper (12mg  x3 days, 8mg  x3 days and then continue 4mg  qd) - Keep medrol at 4mg  daily  - Instructed patient to continue Atrovent TID, restart levalbuterol twice day - Back up albuterol q4hrs

## 2018-02-25 NOTE — Progress Notes (Signed)
@Patient  ID: Stacey [name], female    DOB: Dec 06, 1940, 77 y.o.   MRN: [id number]  Chief Complaint  Patient presents with  . Acute Visit    wheeze, dry cough    Referring provider: Shon Baton, MD  HPI: 77 year old female. Hx chronic lung disease, asthma, ILD, RA. Patient of Dr. Melvyn Novas. Recent hospital admission from 7/13-7/17 for asthma exac/ILD. Treated with Levaquin and extended prednisone course.   Significant testing reviewed:  >>HRCT on 11/06/2016  Lungs/Pleura: Patchy multifocal areas of peripheral predominant ground-glass attenuation and septal thickening and again noted throughout all aspects of the lungs bilaterally. Findings are most prevalent throughout the lung apices and the lung bases with relative sparing of the mid lungs. Scattered areas of peripheral bronchiolectasis are noted. No frank honeycombing. A few scattered areas of mild cylindrical bronchiectasis are also noted. Inspiratory and expiratory imaging demonstrates moderate air trapping indicative of small airways disease. These findings appear essentially stable compared to the prior study. No acute consolidative airspace disease. No pleural effusions. Extensive areas of bilateral apical predominant pleuroparenchymal thickening and architectural distortion, somewhat nodular in appearance, similar to prior examinations, including the area of subpleural nodularity of concern on prior study from 10/17/2015; these findings are most compatible with chronic post infectious or inflammatory scarring. Pleural calcifications are also noted throughout the apical portions of the thorax bilaterally.  >>PFTs 01/10/18 - FVC 1.99 (67%), FEV1 1.55 (70%), Ratio 78, DLCOunc 16.09 (59%)  02/25/2018 Patient presents today with wheezing and dry cough. She has questions about her medications. Currently taking 4mg  medrol alternating with 2mg  every other day. She has only been using Atrovent neb twice a day. She stopped using levalbuterol neb  treatments because they made her feel jittery but she thinks that's because she was on prednisone at the same time. She has not been using prn albuterol neb treatment. Denies chest congestion, wants to know if she can stop taking mucinex. She has not been taking claritin. Continues PPI and H2 blocker for GERD control, decreased to once daily per rheumatology.  Most recent PFTs in July 2019 were minimally changed from previous. 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 No significant progression. Per Dr. Gustavus Bryant note on 01/2017, ILD is typical in RA and clinical history does not suggest HSP. Key to long term fix is control of RA. Patient followed with rheumatology, seen this week. There is consideration to change Orencia to Rituxan.     Allergies  Allergen Reactions  . Penicillins     Unknown Has patient had a PCN reaction causing immediate rash, facial/tongue/throat swelling, SOB or lightheadedness with hypotension: Unknown Has patient had a PCN reaction causing severe rash involving mucus membranes or skin necrosis: Unknown Has patient had a PCN reaction that required hospitalization: No Has patient had a PCN reaction occurring within the last 10 years: No If all of the above answers are "NO", then may proceed with Cephalosporin use.  Unsure of reaction As a child not sure   . Remicade [Infliximab]   . Sulfa Antibiotics     Unknown reaction  . Sulfamethoxazole     Unsure of reaction As a child not sure     Immunization History  Administered Date(s) Administered  . Influenza Split 04/12/2013, 04/12/2014, 03/14/2015, 04/23/2016  . Influenza, High Dose Seasonal PF 04/12/2017  . Influenza-Unspecified 04/03/2013, 04/22/2017  . Pneumococcal Conjugate-13 04/03/2013, 09/28/2014  . Pneumococcal Polysaccharide-23 07/13/2012, 07/24/2013  . Tdap 12/10/2017  Past Medical History:  Diagnosis Date  . Abnormal  finding of blood chemistry   . Asthma   . H/O measles   . H/O varicella   . Hypertension   . 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 Lichen sclerosus 49/67/59   Asymptomatic  . Low iron   . Mitral valve prolapse   . Osteoarthritis   . Osteoporosis   . Post herpetic neuralgia   . Rheumatoid arthritis(714.0)   . Yeast infection     Tobacco History: Social History   Tobacco Use  Smoking Status Never Smoker  Smokeless Tobacco Never Used   Counseling given: Not Answered   Outpatient Medications Prior to Visit  Medication Sig Dispense Refill  . abatacept (ORENCIA) 250 MG injection Inject into the vein every 30 (thirty) days.     Marland Kitchen amitriptyline (ELAVIL) 25 MG tablet Take 25 mg by mouth at bedtime.  0  . beta carotene w/minerals (OCUVITE) tablet Take 1 tablet by mouth daily.    . cholecalciferol (VITAMIN D) 1000 UNITS tablet Take 1,000 Units by mouth daily.    Marland Kitchen dextromethorphan (DELSYM) 30 MG/5ML liquid Take 2.5 mLs (15 mg total) by mouth at bedtime. 89 mL 0  . famotidine (PEPCID) 20 MG tablet Take 1 tablet (20 mg total) by mouth 2 (two) times daily. 60 tablet 0  . gabapentin (NEURONTIN) 300 MG capsule Take 600 mg by mouth at bedtime.    Marland Kitchen ipratropium (ATROVENT) 0.02 % nebulizer solution Take 2.5 mLs (0.5 mg total) by nebulization 3 (three) times daily. 75 mL 12  . losartan (COZAAR) 25 MG tablet Take 25 mg by mouth daily.  11  . methylPREDNISolone (MEDROL) 8 MG tablet Take 4 mg by mouth daily. Take after complete with prednisone     . pantoprazole (PROTONIX) 40 MG tablet Take 1 tablet (40 mg total) by mouth 2 (two) times daily before a meal. 60 tablet 0  . polyethylene glycol (MIRALAX / GLYCOLAX) packet Take 17 g by mouth daily. 14 each 0  . venlafaxine XR (EFFEXOR-XR) 75 MG 24 hr capsule Take 75 mg by mouth at bedtime.     Marland Kitchen guaiFENesin (MUCINEX) 600 MG 12 hr tablet Take 1 tablet (600 mg total) by mouth 2 (two) times daily. 60 tablet 0  . albuterol (PROVENTIL) (2.5 MG/3ML) 0.083%  nebulizer solution Take 3 mLs (2.5 mg total) by nebulization every 4 (four) hours as needed for wheezing or shortness of breath (with flutter  (dx: J45.991)). (Patient not taking: Reported on 02/25/2018) 125 mL 0  . feeding supplement, ENSURE ENLIVE, (ENSURE ENLIVE) LIQD Take 237 mLs by mouth daily. (Patient not taking: Reported on 02/25/2018) 237 mL 12  . HYDROcodone-homatropine (HYCODAN) 5-1.5 MG/5ML syrup Take 2.5 mLs by mouth every 6 (six) hours as needed for cough. (Patient not taking: Reported on 02/25/2018) 120 mL 0  . levalbuterol (XOPENEX) 0.63 MG/3ML nebulizer solution Take 3 mLs (0.63 mg total) by nebulization 3 (three) times daily. (Patient not taking: Reported on 02/25/2018) 3 mL 12  . loratadine (CLARITIN) 10 MG tablet Take 1 tablet (10 mg total) by mouth daily. (Patient not taking: Reported on 02/25/2018) 30 tablet 0  . metoCLOPramide (REGLAN) 5 MG tablet Take 1 tablet (5 mg total) by mouth 3 (three) times daily before meals. (Patient not taking: Reported on 02/25/2018) 15 tablet 0  . predniSONE (DELTASONE) 10 MG tablet Take 40mg  daily for 5days,Take 30mg  daily for 5days,Take 20mg  daily for 5days,Take 10mg  daily for 5days, then switch to medrol 50 tablet  0  . Respiratory Therapy Supplies (FLUTTER) DEVI Use as directed (Patient not taking: Reported on 02/25/2018) 1 each 0   No facility-administered medications prior to visit.     Review of Systems  Review of Systems  Constitutional: Negative.   HENT: Negative for congestion, postnasal drip, sinus pressure and sinus pain.   Respiratory: Positive for cough and wheezing.   Cardiovascular: Negative.   Skin: Negative.     Physical Exam  BP 130/70 (BP Location: Left Arm, Cuff Size: Normal)   Pulse 94   Ht 5' 5.5" (1.664 m)   Wt 141 lb 9.6 oz (64.2 kg)   SpO2 95%   BMI 23.21 kg/m  Physical Exam  Constitutional: She is oriented to person, place, and time. She appears well-developed and well-nourished.  HENT:  Head: Normocephalic and  atraumatic.  Eyes: Pupils are equal, round, and reactive to light. EOM are normal.  Neck: Normal range of motion. Neck supple.  Cardiovascular: Normal rate and regular rhythm.  Pulmonary/Chest: Effort normal. She has wheezes.  LS with coarse insp/exp wheeze. No resp distress. Able to speak in full sentences.   Musculoskeletal: Normal range of motion.  Neurological: She is alert and oriented to person, place, and time.  Skin: Skin is warm and dry.  Psychiatric: She has a normal mood and affect. Her behavior is normal. Judgment and thought content normal.     Lab Results:  CBC    Component Value Date/Time   WBC 10.0 01/27/2018 0445   RBC 4.23 01/27/2018 0445   HGB 12.6 01/27/2018 0445   HCT 39.1 01/27/2018 0445   PLT 298 01/27/2018 0445   MCV 92.4 01/27/2018 0445   MCH 29.8 01/27/2018 0445   MCHC 32.2 01/27/2018 0445   RDW 14.1 01/27/2018 0445   LYMPHSABS 3.3 01/27/2018 0445   MONOABS 1.0 01/27/2018 0445   EOSABS 0.1 01/27/2018 0445   BASOSABS 0.0 01/27/2018 0445    BMET    Component Value Date/Time   NA 138 01/27/2018 0445   K 4.1 01/27/2018 0445   CL 98 01/27/2018 0445   CO2 29 01/27/2018 0445   GLUCOSE 90 01/27/2018 0445   BUN 19 01/27/2018 0445   CREATININE 0.65 01/27/2018 0445   CALCIUM 8.3 (L) 01/27/2018 0445   GFRNONAA >60 01/27/2018 0445   GFRAA >60 01/27/2018 0445    BNP    Component Value Date/Time   BNP 116.2 (H) 01/23/2018 1300    ProBNP No results found for: PROBNP  Imaging: No results found.   Assessment & Plan:   Restrictive lung disease PFTs minimally changed from previous 01/10/18 - FVC 1.99 (67%), FEV1 1.55 (70%), Ratio 78, DLCOunc 16.09 (59%)  ILD (interstitial lung disease) ([place]) Repeat HRCT with IPF protocol   Cough variant asthma vs uacs  - Significant insp/exp wheeze on exam. Appears comfortable, no resp distress. O2 sat 95% RA.  - FENO 30  - Medrol taper (12mg  x3 days, 8mg  x3 days and then continue 4mg  qd) - Keep medrol at  4mg  daily  - Instructed patient to continue Atrovent TID, restart levalbuterol twice day - Back up albuterol q4hrs    Arthritis, rheumatoid ([place]) - Seen by rheumatology on 08/14, there is consideration to change Orencia to Rituxin (looking for pulm input, I will check with Dr. Melvyn Novas).      Martyn Ehrich, NP 02/25/2018

## 2018-02-26 NOTE — Progress Notes (Signed)
Chart and office note reviewed in detail  > agree with a/p as outlined   No problem with Rituxin from pulmonary perspective

## 2018-02-28 ENCOUNTER — Telehealth: Payer: Self-pay | Admitting: Primary Care

## 2018-02-28 NOTE — Telephone Encounter (Signed)
Please let patient know its ok to change RA medication to Rituxin from a pulmonary standpoint. Thanks

## 2018-02-28 NOTE — Telephone Encounter (Signed)
-----   Message from Tanda Rockers, MD sent at 02/26/2018  5:41 AM EDT ----- Fine with me ----- Message ----- From: Martyn Ehrich, NP Sent: 02/25/2018   5:21 PM EDT To: Tanda Rockers, MD  Seen by rheumatology on 08/14, there is consideration to change Orencia to Rituxin. They wanted to know if this was ok from pulm standpoint?

## 2018-02-28 NOTE — Telephone Encounter (Signed)
Spoke with pt,aware of recs.  Nothing further needed.  

## 2018-03-01 ENCOUNTER — Ambulatory Visit: Payer: Medicare Other | Admitting: Internal Medicine

## 2018-03-01 ENCOUNTER — Ambulatory Visit: Payer: Medicare Other | Admitting: Primary Care

## 2018-03-07 ENCOUNTER — Ambulatory Visit (INDEPENDENT_AMBULATORY_CARE_PROVIDER_SITE_OTHER)
Admission: RE | Admit: 2018-03-07 | Discharge: 2018-03-07 | Disposition: A | Discharge status: Home / Self Care | Payer: Medicare Other | Source: Ambulatory Visit | Attending: Primary Care | Admitting: Primary Care

## 2018-03-07 DIAGNOSIS — J849 Interstitial pulmonary disease, unspecified: Secondary | ICD-10-CM

## 2018-03-07 DIAGNOSIS — R0602 Shortness of breath: Secondary | ICD-10-CM | POA: Diagnosis not present

## 2018-03-09 ENCOUNTER — Telehealth: Payer: Self-pay | Admitting: Primary Care

## 2018-03-09 NOTE — Telephone Encounter (Signed)
Faxed to number

## 2018-03-09 NOTE — Telephone Encounter (Signed)
Can you fax most recent HRCT to Dr. Eda Paschal with [facility]. Fax [phone]

## 2018-03-16 DIAGNOSIS — M069 Rheumatoid arthritis, unspecified: Secondary | ICD-10-CM | POA: Diagnosis not present

## 2018-03-21 ENCOUNTER — Ambulatory Visit (INDEPENDENT_AMBULATORY_CARE_PROVIDER_SITE_OTHER)
Admission: RE | Admit: 2018-03-21 | Discharge: 2018-03-21 | Disposition: A | Discharge status: Home / Self Care | Payer: Medicare Other | Source: Ambulatory Visit | Attending: Internal Medicine | Admitting: Internal Medicine

## 2018-03-21 ENCOUNTER — Ambulatory Visit (INDEPENDENT_AMBULATORY_CARE_PROVIDER_SITE_OTHER): Payer: Medicare Other | Admitting: Internal Medicine

## 2018-03-21 ENCOUNTER — Encounter: Payer: Self-pay | Admitting: Internal Medicine

## 2018-03-21 ENCOUNTER — Other Ambulatory Visit (INDEPENDENT_AMBULATORY_CARE_PROVIDER_SITE_OTHER): Payer: Medicare Other

## 2018-03-21 VITALS — BP 116/74 | HR 65 | Ht 65.5 in | Wt 139.4 lb

## 2018-03-21 DIAGNOSIS — R0602 Shortness of breath: Secondary | ICD-10-CM | POA: Diagnosis not present

## 2018-03-21 DIAGNOSIS — M069 Rheumatoid arthritis, unspecified: Secondary | ICD-10-CM

## 2018-03-21 DIAGNOSIS — R0609 Other forms of dyspnea: Secondary | ICD-10-CM

## 2018-03-21 DIAGNOSIS — R05 Cough: Secondary | ICD-10-CM | POA: Diagnosis not present

## 2018-03-21 DIAGNOSIS — K219 Gastro-esophageal reflux disease without esophagitis: Secondary | ICD-10-CM

## 2018-03-21 DIAGNOSIS — J45991 Cough variant asthma: Secondary | ICD-10-CM | POA: Diagnosis not present

## 2018-03-21 LAB — CBC WITH DIFFERENTIAL/PLATELET
Basophils Absolute: 0.1 10*3/uL (ref 0.0–0.1)
Basophils Relative: 0.7 % (ref 0.0–3.0)
Eosinophils Absolute: 1.1 10*3/uL — ABNORMAL HIGH (ref 0.0–0.7)
Eosinophils Relative: 10.3 % — ABNORMAL HIGH (ref 0.0–5.0)
HCT: 37.4 % (ref 36.0–46.0)
Hemoglobin: 12.5 g/dL (ref 12.0–15.0)
Lymphocytes Relative: 19.1 % (ref 12.0–46.0)
Lymphs Abs: 2.1 10*3/uL (ref 0.7–4.0)
MCHC: 33.5 g/dL (ref 30.0–36.0)
MCV: 89.9 fl (ref 78.0–100.0)
Monocytes Absolute: 1.2 10*3/uL — ABNORMAL HIGH (ref 0.1–1.0)
Monocytes Relative: 11.1 % (ref 3.0–12.0)
Neutro Abs: 6.3 10*3/uL (ref 1.4–7.7)
Neutrophils Relative %: 58.8 % (ref 43.0–77.0)
Platelets: 358 10*3/uL (ref 150.0–400.0)
RBC: 4.16 Mil/uL (ref 3.87–5.11)
RDW: 15.3 % (ref 11.5–15.5)
WBC: 10.7 10*3/uL — ABNORMAL HIGH (ref 4.0–10.5)

## 2018-03-21 LAB — BRAIN NATRIURETIC PEPTIDE: Pro B Natriuretic peptide (BNP): 38 pg/mL (ref 0.0–100.0)

## 2018-03-21 LAB — BASIC METABOLIC PANEL
BUN: 14 mg/dL (ref 6–23)
CO2: 32 mEq/L (ref 19–32)
Calcium: 9 mg/dL (ref 8.4–10.5)
Chloride: 99 mEq/L (ref 96–112)
Creatinine, Ser: 0.87 mg/dL (ref 0.40–1.20)
GFR: 66.89 mL/min (ref >60.00)
Glucose, Bld: 102 mg/dL — ABNORMAL HIGH (ref 70–99)
Potassium: 4.3 mEq/L (ref 3.5–5.1)
Sodium: 137 mEq/L (ref 135–145)

## 2018-03-21 LAB — TSH: TSH: 2.43 u[IU]/mL (ref 0.35–4.50)

## 2018-03-21 LAB — SEDIMENTATION RATE: Sed Rate: 45 mm/hr — ABNORMAL HIGH (ref 0–30)

## 2018-03-21 MED ORDER — METOCLOPRAMIDE HCL 5 MG PO TABS: 5.0000 mg | ORAL_TABLET | Freq: Three times a day (TID) | ORAL | 0 refills | Status: DC

## 2018-03-21 MED ORDER — ACETAMINOPHEN-CODEINE #3 300-30 MG PO TABS: 1.0000 | ORAL_TABLET | ORAL | 0 refills | Status: DC | PRN

## 2018-03-21 NOTE — Patient Instructions (Addendum)
Protonix 40 mg Take 30- 60 min before your first and last meals of the day   Gabapentin should be 300 mg breakfast and bedtime   Shortness of breath/ wheezing >>>  albuterol neb every 4 hours as needed  And stop ipatropium   Medrol  4 mg take two until better then resume one   For cough >>> Take delsym two tsp every 12 hours and use the flutter as much as possible  and supplement if needed with  Tylenol #3  mg up to 1-2 every 4 hours to suppress the urge to cough. Swallowing water and/or using ice chips/non mint and menthol containing candies (such as lifesavers or sugarless jolly ranchers) are also effective.  You should rest your voice and avoid activities that you know make you cough.  Once you have eliminated the cough for 3 straight days try reducing the tylenol #3  first,  then the delsym as tolerated.     Please see patient coordinator before you leave today  to schedule GI eval for reflux    See Tammy NP w/in 2 weeks with all your medications, even over the counter meds, separated in two separate bags, the ones you take no matter what vs the ones you stop once you feel better and take only as needed when you feel you need them.   Tammy  will generate for you a new user friendly medication calendar that will put [place] all on the same page re: your medication use.     Without this process, it simply isn't possible to assure that we are providing  your outpatient care  with  the attention to detail we feel you deserve.   If we cannot assure that you're getting that kind of care,  then we cannot manage your problem effectively from this clinic.  Once you have seen Tammy and we are sure that we're all on the same page with your medication use she will arrange follow up with me.    Please remember to go to the  x-ray department downstairs in the basement  for your tests - we will call you with the results when they are available.

## 2018-03-21 NOTE — Progress Notes (Signed)
Spoke with pt's spouse and notified of results per Dr. Melvyn Novas. He verbalized understanding and denied any questions.

## 2018-03-21 NOTE — Progress Notes (Signed)
Subjective:   Patient ID: Stacey [name], female    DOB: 08/13/1940     MRN: [id number]    Brief patient profile:  6 yowf  never smoker with allergies/inhalers as child outgrew by [employer] then  RA since around 2000  Prednisone  x decades and prev eval by Dr Joya Gaskins around 2004 for sob resolved s maint rx and referred 05/26/2013 by Dr Shelia Media for bronchitis and abn cxr   History of Present Illness  05/26/2013 1st [facility] office visit/ Stacey [name] cc June 2014 dx pna  In [place] and remicade stopped and 100% better and placed arencia in September 2014  then abruptly worse first week in November with cough green sputum s nasal symptoms, fever low grade and no cp or cough and completely recovered prior to [place] does not recall abx but issue is why keeps getting sick and abn CT Chest (see below).   Arthritis symptoms well controlled at present on Rx for RA rec Nexium 40 mg Take 30-60 min before first meal of the day and add pepcid 20 mg one at bedtime whenever coughing.     10/19/2017  f/u ov/Stacey [name] re:  RA lung dz  Chief Complaint  Patient presents with  . Follow-up    Cough is much improved, but has not resolved yet. Cough is non prod. She has not had to use her neb.   Dyspnea:  Not limited by breathing from desired activities  But some doe x steps Cough: daytime > noct dry  Sleep: fine  SABA use:  No saba Medrol 4 mg a/w 2mg  per day/ ok control of arthritis  rec Start back on gabapentin up to 300 mg each am  in addition to the the two at bedtime  If not better increase the medrol to 8 mg daily until bettter then taper back to where you      01/10/2018  f/u ov/Stacey [name] re:  RA  Lung dz Medrol  4 mg  One alternating with a half Chief Complaint  Patient presents with  . Follow-up    PFT's done. Her breathing has been gradually worsening since the last visit. She has occ cough- non prod.   Dyspnea gradually worse since last ov:  MMRC1 =  MMRC3 = can't walk 100 yards even at a slow pace  at a flat grade s stopping due to sob    Gradually x 3 m / more fatigue / no change in arthritis  Cough: not an issue rec Protonix 40 mg Take 30-60 min before first meal of the day  GERD diet   01/17/2018 acute extended ov/Stacey [name] re: cough on medrol 4 mg  One a/w one half  Chief Complaint  Patient presents with  . Acute Visit    started coughing 01/11/18- occ prod with minimal green sputum.  She states also wheezing and having increased SOB.    abruptly worse 01/11/18 with severe 24/7 coughing >>  prod min green mucus esp in am/ assoc with subjective wheeze and did not follow previous contingencies re flutter / saba/ increase medrol and admits she does not rember those written instructions nor how to use the neb provided .  No fever/ comfortable at rest sitting  rec For cough > mucinex dm 1200 mg every 12 hours and cough into the flutter valve as much as possible  Doxycycline 100 mg twice daily x 10 days with glass of water Medrol 4mg  x 2 now and take 2  daily until cough is  better then 1 daily x 5 days and then resume the previous dose  Shortness of breath/ wheezing/ still coughing > albuterol neb every 4 hours as needed      01/20/2018 acute extended ov/Stacey [name] re: refractory cough and sob 01/11/18 Chief Complaint  Patient presents with  . Acute Visit    she is not feeling better, coughing , very SOB, wheezing  mucus now clear/scant  on doxy/ neb machine not working (tube would not plug into the side s adequate force and she was not capable of applying it due to RA hands. Cough/ wheeze/ sob 24/7 / flutter not helping/ can't lie down at hs   rec While coughing protonix 40 mg Take 30- 60 min before your first and last meals of the day  Shortness of breath/ wheezing/ still coughing > albuterol neb every 4 hours as needed  Depomedrol 120 mg IM and medrol 32 mg daily x 2 days,  then 16 mg x 3 days,  Then 8 mg x 4 days , then resume the 4 mg daily  For severe cough > tylenol 3# one every 4 hours if  needed  Go to ER if condition worsens on above plan       Date of admission: 01/22/2018             Date of discharge: 01/27/2018   History of present illness: As per the H and P dictated on admission, "Stacey [name] a77 y.o.female,w Rheumatoid arthritis, ILD Asthma, apparently c/o increase in dyspnea this evening. Dry cough. Denies fever, chills, cp, palp, N/v, diarrhea, brbpr, black stool. Pt notes recently being given steroid injection in office as well as being placed on doxycycline. This might have helped slightly but pt worse Hospital Course:  Summary of her active problems in the hospital is as following. 1 dyspnea/hypoxemia/ILD Concerned that likely GERD Is causing ILD. Patient with cough.  Patient with complaints of awakening with cough and also with oral intake which is slightly improved since 01/24/2018. assessed by speech therapy and speech therapy raising concern of esophageal component but no signs of aspiration.  2D echo with a EF of 55 to 60% with no wall motion abnormalities, grade 1 diastolic dysfunction.  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 Pulmonary felt that the patient should be on scheduled Reglan. I have placed the patient on scheduled potassium before sleep. Continue steroids on discharge continue Mucinex and Claritin as well as inhalers. Patient will follow-up with pulmonary outpatient  2. Gastroesophageal reflux disease Continue PPI and H2 blocker.I changed PPI to [facility].  3. Rheumatoid arthritis Outpatient follow-up.   4. Anxiety Continue Effexor.    All other chronic medical condition were stable during the hospitalization.  Patient was ambulatory without any assistance. On the day of the discharge the patient's vitals were stable , and no other acute medical condition were reported by patient. the patient was felt safe to be discharge at home with family.  Consultants: PCCM   Procedures: Echocardiogram   DISCHARGE MEDICATION:      Allergies as of 01/27/2018      Reactions   Penicillins    Unknown Has patient had a PCN reaction causing immediate rash, facial/tongue/throat swelling, SOB or lightheadedness with hypotension: Unknown Has patient had a PCN reaction causing severe rash involving mucus membranes or skin necrosis: Unknown Has patient had a PCN reaction that required hospitalization: No Has patient had a PCN reaction occurring within the last 10 years: No If all of the  above answers are "NO", then may proceed with Cephalosporin use.   Remicade [infliximab]    Sulfa Antibiotics    Unknown reaction         Medication List    STOP taking these medications   acetaminophen-codeine 300-30 MG tablet Commonly known as:  TYLENOL #3   doxycycline 100 MG tablet Commonly known as:  VIBRA-TABS   methylPREDNISolone 4 MG tablet Commonly known as:  MEDROL   methylPREDNISolone 8 MG tablet Commonly known as:  MEDROL   ranitidine 150 MG capsule Commonly known as:  ZANTAC Replaced by:  famotidine 20 MG tablet     TAKE these medications   albuterol (2.5 MG/3ML) 0.083% nebulizer solution Commonly known as:  PROVENTIL Take 3 mLs (2.5 mg total) by nebulization every 4 (four) hours as needed for wheezing or shortness of breath (with flutter  (dx: J45.991)).   amitriptyline 25 MG tablet Commonly known as:  ELAVIL Take 25 mg by mouth at bedtime.   beta carotene w/minerals tablet Take 1 tablet by mouth daily.   cholecalciferol 1000 units tablet Commonly known as:  VITAMIN D Take 1,000 Units by mouth daily.   dextromethorphan 30 MG/5ML liquid Commonly known as:  DELSYM Take 2.5 mLs (15 mg total) by mouth at bedtime.   famotidine 20 MG tablet Commonly known as:  PEPCID Take 1 tablet (20 mg total) by mouth 2 (two) times daily. Replaces:  ranitidine 150 MG capsule   feeding supplement (ENSURE ENLIVE) Liqd Take 237 mLs by  mouth daily.   FLUTTER Devi Use as directed   gabapentin 300 MG capsule Commonly known as:  NEURONTIN Take 600 mg by mouth at bedtime.   guaiFENesin 600 MG 12 hr tablet Commonly known as:  MUCINEX Take 1 tablet (600 mg total) by mouth 2 (two) times daily.   HYDROcodone-homatropine 5-1.5 MG/5ML syrup Commonly known as:  HYCODAN Take 2.5 mLs by mouth every 6 (six) hours as needed for cough.   ipratropium 0.02 % nebulizer solution Commonly known as:  ATROVENT Take 2.5 mLs (0.5 mg total) by nebulization 3 (three) times daily.   levalbuterol 0.63 MG/3ML nebulizer solution Commonly known as:  XOPENEX Take 3 mLs (0.63 mg total) by nebulization 3 (three) times daily.   loratadine 10 MG tablet Commonly known as:  CLARITIN Take 1 tablet (10 mg total) by mouth daily.   losartan 25 MG tablet Commonly known as:  COZAAR Take 25 mg by mouth daily.   metoCLOPramide 5 MG tablet Commonly known as:  REGLAN Take 1 tablet (5 mg total) by mouth 3 (three) times daily before meals.   nystatin 100000 UNIT/ML suspension Commonly known as:  MYCOSTATIN Take 5 mLs (500,000 Units total) by mouth 4 (four) times daily for 9 days.   ORENCIA 250 MG injection Generic drug:  abatacept Inject into the vein every 30 (thirty) days.   pantoprazole 40 MG tablet Commonly known as:  PROTONIX Take 1 tablet (40 mg total) by mouth 2 (two) times daily before a meal. What changed:  See the new instructions.   polyethylene glycol packet Commonly known as:  MIRALAX / GLYCOLAX Take 17 g by mouth daily.   predniSONE 10 MG tablet Commonly known as:  DELTASONE Take 40mg  daily for 5days,Take 30mg  daily for 5days,Take 20mg  daily for 5days,Take 10mg  daily for 5days, then switch to medrol   venlafaxine XR 75 MG 24 hr capsule Commonly known as:  EFFEXOR-XR Take 75 mg by mouth at bedtime.       03/21/2018  f/u ov/Pancho Rushing re:   S/p admit was transiently better  and downhill since Labor day on medrol 4  mg daily  Last orencia on Sept 4th 2019  Chief Complaint  Patient presents with  . Acute Visit    Per patient, she has had a dry cough since July 2019. She has been wheezing as well. Increased fatigue. Body aches. Denies any fever or chills.   Dyspnea:  MMRC4  = sob if tries to leave home or while getting dressed   Cough: harsh/ hacking mostly dry/ has flutter not using    SABA use: not much better with rx   No obvious day to day or daytime variability or assoc excess/ purulent sputum or mucus plugs or hemoptysis or cp or chest tightness, subjective wheeze or overt sinus or hb symptoms.     Also denies any obvious fluctuation of symptoms with weather or environmental changes or other aggravating or alleviating factors except as outlined above   No unusual exposure hx or h/o childhood pna/ asthma or knowledge of premature birth.  Current Allergies, Complete Past Medical History, Past Surgical History, Family History, and Social History were reviewed in [employer] record.  ROS  The following are not active complaints unless bolded Hoarseness, sore throat, dysphagia, dental problems, itching, sneezing,  nasal congestion or discharge of excess mucus or purulent secretions, ear ache,   fever, chills, sweats, unintended wt loss or wt gain, classically pleuritic or exertional cp,  orthopnea pnd or arm/hand swelling  or leg swelling, presyncope, palpitations, abdominal pain, anorexia, nausea, vomiting, diarrhea  or change in bowel habits or change in bladder habits, change in stools or change in urine, dysuria, hematuria,  rash, arthralgias, visual complaints, headache, numbness, weakness or ataxia or problems with walking or coordination,  change in mood or  memory.        Current Meds  Medication Sig  . amitriptyline (ELAVIL) 25 MG tablet Take 25 mg by mouth at bedtime.  . beta carotene w/minerals (OCUVITE) tablet Take 1 tablet by mouth daily.  . cholecalciferol (VITAMIN  D) 1000 UNITS tablet Take 1,000 Units by mouth daily.  Marland Kitchen dextromethorphan (DELSYM) 30 MG/5ML liquid Take 2.5 mLs (15 mg total) by mouth at bedtime.  . gabapentin (NEURONTIN) 300 MG capsule Take 600 mg by mouth at bedtime.  Marland Kitchen losartan (COZAAR) 25 MG tablet Take 25 mg by mouth daily.  . methylPREDNISolone (MEDROL) 8 MG tablet Take 2 mg by mouth at bedtime.   . pantoprazole (PROTONIX) 40 MG tablet Take 1 tablet (40 mg total) by mouth 2 (two) times daily before a meal. (Patient taking differently: Take 40 mg by mouth 2 (two) times daily as needed (For heartburn or acid reflux.). )  . polyethylene glycol (MIRALAX / GLYCOLAX) packet Take 17 g by mouth daily. (Patient taking differently: Take 17 g by mouth daily as needed (For constipation.). )  . venlafaxine XR (EFFEXOR-XR) 75 MG 24 hr capsule Take 75 mg by mouth at bedtime.   .     .  famotidine (PEPCID) 20 MG tablet Take 1 tablet (20 mg total) by mouth 2 (two) times daily.  . [  ipratropium (ATROVENT) 0.02 % nebulizer solution Take 2.5 mLs (0.5 mg total) by nebulization 3 (three) times daily.                   Objective:   Physical Exam  amb wf nad   Vital signs reviewed - Note on arrival 02 sats  90% on RA     07/17/2013  146  > 09/28/2014   142 > 04/18/2015  145 > 11/01/2015 143 > 02/07/2016  143 > 09/25/2016  139 > 10/22/2016  140 >  01/11/2017 146 > 10/19/2017  140 >  01/10/2018  142 >  01/17/2018  140 > 01/20/2018  137> 03/21/2018   139     05/26/13 147 lb (66.679 kg)  01/13/12 145 lb (65.772 kg)      HEENT: nl dentition, turbinates bilaterally, and oropharynx. Nl external ear canals without cough reflex   NECK :  without JVD/Nodes/TM/ nl carotid upstrokes bilaterally   LUNGS: no acc muscle use,  Nl contour chest  With prominent insp/ exp rhonchi bilaterally without cough on insp or exp maneuvers   CV:  RRR  no s3 or murmur or increase in P2, and no edema   ABD:  soft and nontender with nl inspiratory excursion in the supine position. No  bruits or organomegaly appreciated, bowel sounds nl  MS:  Nl gait/ ext warm without deformities, calf tenderness, cyanosis or clubbing No obvious joint restrictions   SKIN: warm and dry without lesions    NEURO:  alert, approp, nl sensorium with  no motor or cerebellar deficits apparent.    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 No acute findings.    Labs ordered/ reviewed:      Chemistry      Component Value Date/Time   NA 141 03/23/2018 0731   K 4.2 03/23/2018 0731   CL 101 03/23/2018 0731   CO2 29 03/23/2018 0731   BUN 11 03/23/2018 0731   CREATININE 0.67 03/23/2018 0731      Component Value Date/Time   CALCIUM 8.8 (L) 03/23/2018 0731   ALKPHOS 39 01/27/2018 0445   AST 22 01/27/2018 0445   ALT 29 01/27/2018 0445   BILITOT 0.6 01/27/2018 0445        Lab Results  Component Value Date   WBC 14.1 (H) 03/23/2018   HGB 12.5 03/23/2018   HCT 39.0 03/23/2018   MCV 94.4 03/23/2018   PLT 375 03/23/2018       EOS                                                              1.1                                     03/21/2018     Lab Results  Component Value Date   TSH 2.43 03/21/2018     Lab Results  Component Value Date   PROBNP 38.0 03/21/2018       Lab Results  Component Value Date   ESRSEDRATE 45 (H) 03/21/2018            Assessment & Plan:

## 2018-03-22 LAB — RESPIRATORY ALLERGY PROFILE REGION II ~~LOC~~

## 2018-03-22 LAB — INTERPRETATION:

## 2018-03-22 NOTE — Progress Notes (Signed)
Spoke with Festus Aloe (on DPR) and notified of results

## 2018-03-23 ENCOUNTER — Encounter (HOSPITAL_COMMUNITY): Payer: Self-pay | Admitting: *Deleted

## 2018-03-23 ENCOUNTER — Other Ambulatory Visit: Payer: Self-pay

## 2018-03-23 ENCOUNTER — Emergency Department (HOSPITAL_COMMUNITY): Payer: Medicare Other

## 2018-03-23 ENCOUNTER — Encounter (HOSPITAL_COMMUNITY): Payer: Self-pay

## 2018-03-23 ENCOUNTER — Encounter: Payer: Self-pay | Admitting: Internal Medicine

## 2018-03-23 ENCOUNTER — Inpatient Hospital Stay (HOSPITAL_COMMUNITY)
Admission: EM | Admit: 2018-03-23 | Discharge: 2018-03-29 | DRG: 202 | Disposition: A | Discharge status: Home Health | Payer: Medicare Other | Attending: Internal Medicine | Admitting: Internal Medicine

## 2018-03-23 DIAGNOSIS — Z6822 Body mass index (BMI) 22.0-22.9, adult: Secondary | ICD-10-CM | POA: Diagnosis not present

## 2018-03-23 DIAGNOSIS — J8489 Other specified interstitial pulmonary diseases: Secondary | ICD-10-CM

## 2018-03-23 DIAGNOSIS — R0609 Other forms of dyspnea: Secondary | ICD-10-CM

## 2018-03-23 DIAGNOSIS — J849 Interstitial pulmonary disease, unspecified: Secondary | ICD-10-CM | POA: Diagnosis present

## 2018-03-23 DIAGNOSIS — J45901 Unspecified asthma with (acute) exacerbation: Secondary | ICD-10-CM | POA: Diagnosis not present

## 2018-03-23 DIAGNOSIS — R0902 Hypoxemia: Secondary | ICD-10-CM | POA: Diagnosis not present

## 2018-03-23 DIAGNOSIS — K219 Gastro-esophageal reflux disease without esophagitis: Secondary | ICD-10-CM

## 2018-03-23 DIAGNOSIS — E7439 Other disorders of intestinal carbohydrate absorption: Secondary | ICD-10-CM | POA: Diagnosis present

## 2018-03-23 DIAGNOSIS — Z803 Family history of malignant neoplasm of breast: Secondary | ICD-10-CM | POA: Diagnosis not present

## 2018-03-23 DIAGNOSIS — I10 Essential (primary) hypertension: Secondary | ICD-10-CM

## 2018-03-23 DIAGNOSIS — M810 Age-related osteoporosis without current pathological fracture: Secondary | ICD-10-CM | POA: Diagnosis present

## 2018-03-23 DIAGNOSIS — J4541 Moderate persistent asthma with (acute) exacerbation: Secondary | ICD-10-CM

## 2018-03-23 DIAGNOSIS — Z23 Encounter for immunization: Secondary | ICD-10-CM | POA: Diagnosis not present

## 2018-03-23 DIAGNOSIS — Z9981 Dependence on supplemental oxygen: Secondary | ICD-10-CM

## 2018-03-23 DIAGNOSIS — M0510 Rheumatoid lung disease with rheumatoid arthritis of unspecified site: Secondary | ICD-10-CM | POA: Diagnosis present

## 2018-03-23 DIAGNOSIS — J9601 Acute respiratory failure with hypoxia: Secondary | ICD-10-CM

## 2018-03-23 DIAGNOSIS — F411 Generalized anxiety disorder: Secondary | ICD-10-CM | POA: Diagnosis present

## 2018-03-23 DIAGNOSIS — R627 Adult failure to thrive: Secondary | ICD-10-CM | POA: Diagnosis present

## 2018-03-23 DIAGNOSIS — F319 Bipolar disorder, unspecified: Secondary | ICD-10-CM | POA: Diagnosis present

## 2018-03-23 DIAGNOSIS — D721 Eosinophilia: Secondary | ICD-10-CM | POA: Diagnosis present

## 2018-03-23 DIAGNOSIS — Z825 Family history of asthma and other chronic lower respiratory diseases: Secondary | ICD-10-CM

## 2018-03-23 DIAGNOSIS — J9811 Atelectasis: Secondary | ICD-10-CM | POA: Diagnosis not present

## 2018-03-23 DIAGNOSIS — M358 Other specified systemic involvement of connective tissue: Secondary | ICD-10-CM

## 2018-03-23 DIAGNOSIS — J45909 Unspecified asthma, uncomplicated: Secondary | ICD-10-CM | POA: Diagnosis present

## 2018-03-23 DIAGNOSIS — F32A Depression, unspecified: Secondary | ICD-10-CM

## 2018-03-23 DIAGNOSIS — F329 Major depressive disorder, single episode, unspecified: Secondary | ICD-10-CM

## 2018-03-23 DIAGNOSIS — R0602 Shortness of breath: Secondary | ICD-10-CM | POA: Diagnosis not present

## 2018-03-23 LAB — BASIC METABOLIC PANEL
Anion gap: 11 (ref 5–15)
BUN: 11 mg/dL (ref 8–23)
CO2: 29 mmol/L (ref 22–32)
Calcium: 8.8 mg/dL — ABNORMAL LOW (ref 8.9–10.3)
Chloride: 101 mmol/L (ref 98–111)
Creatinine, Ser: 0.67 mg/dL (ref 0.44–1.00)
GFR calc Af Amer: >60 mL/min (ref >60)
GFR calc non Af Amer: >60 mL/min (ref >60)
Glucose, Bld: 112 mg/dL — ABNORMAL HIGH (ref 70–99)
Potassium: 4.2 mmol/L (ref 3.5–5.1)
Sodium: 141 mmol/L (ref 135–145)

## 2018-03-23 LAB — CBC WITH DIFFERENTIAL/PLATELET
Basophils Absolute: 0.1 10*3/uL (ref 0.0–0.1)
Basophils Relative: 1 %
Eosinophils Absolute: 2.3 10*3/uL — ABNORMAL HIGH (ref 0.0–0.7)
Eosinophils Relative: 16 %
HCT: 39 % (ref 36.0–46.0)
Hemoglobin: 12.5 g/dL (ref 12.0–15.0)
Lymphocytes Relative: 33 %
Lymphs Abs: 4.6 10*3/uL — ABNORMAL HIGH (ref 0.7–4.0)
MCH: 30.3 pg (ref 26.0–34.0)
MCHC: 32.1 g/dL (ref 30.0–36.0)
MCV: 94.4 fL (ref 78.0–100.0)
Monocytes Absolute: 1.5 10*3/uL — ABNORMAL HIGH (ref 0.1–1.0)
Monocytes Relative: 11 %
Neutro Abs: 5.6 10*3/uL (ref 1.7–7.7)
Neutrophils Relative %: 39 %
Platelets: 375 10*3/uL (ref 150–400)
RBC: 4.13 MIL/uL (ref 3.87–5.11)
RDW: 15.2 % (ref 11.5–15.5)
WBC: 14.1 10*3/uL — ABNORMAL HIGH (ref 4.0–10.5)

## 2018-03-23 LAB — SEDIMENTATION RATE: Sed Rate: 48 mm/hr — ABNORMAL HIGH (ref 0–22)

## 2018-03-23 LAB — CBC
HCT: 39.6 % (ref 36.0–46.0)
Hemoglobin: 12.5 g/dL (ref 12.0–15.0)
MCH: 30 pg (ref 26.0–34.0)
MCHC: 31.6 g/dL (ref 30.0–36.0)
MCV: 95 fL (ref 78.0–100.0)
Platelets: 367 10*3/uL (ref 150–400)
RBC: 4.17 MIL/uL (ref 3.87–5.11)
RDW: 15.1 % (ref 11.5–15.5)
WBC: 13.8 10*3/uL — ABNORMAL HIGH (ref 4.0–10.5)

## 2018-03-23 LAB — CREATININE, SERUM
Creatinine, Ser: 0.76 mg/dL (ref 0.44–1.00)
GFR calc Af Amer: >60 mL/min (ref >60)
GFR calc non Af Amer: >60 mL/min (ref >60)

## 2018-03-23 LAB — TROPONIN I: Troponin I: <0.03 ng/mL (ref <0.03)

## 2018-03-23 LAB — BRAIN NATRIURETIC PEPTIDE: B Natriuretic Peptide: 89.4 pg/mL (ref 0.0–100.0)

## 2018-03-23 MED ORDER — METOCLOPRAMIDE HCL 10 MG PO TABS
5.0000 mg | ORAL_TABLET | Freq: Three times a day (TID) | ORAL | Status: DC
  Administered 2018-03-23 – 2018-03-29 (×16): 5 mg via ORAL
  Filled 2018-03-23 (×17): qty 1

## 2018-03-23 MED ORDER — PROSIGHT PO TABS
1.0000 | ORAL_TABLET | Freq: Every day | ORAL | Status: DC
  Administered 2018-03-23 – 2018-03-29 (×7): 1 via ORAL
  Filled 2018-03-23 (×7): qty 1

## 2018-03-23 MED ORDER — GABAPENTIN 300 MG PO CAPS
600.0000 mg | ORAL_CAPSULE | Freq: Every day | ORAL | Status: DC
  Administered 2018-03-23 – 2018-03-28 (×6): 600 mg via ORAL
  Filled 2018-03-23 (×6): qty 2

## 2018-03-23 MED ORDER — ENOXAPARIN SODIUM 40 MG/0.4ML ~~LOC~~ SOLN
40.0000 mg | Freq: Every day | SUBCUTANEOUS | Status: DC
  Administered 2018-03-23 – 2018-03-29 (×7): 40 mg via SUBCUTANEOUS
  Filled 2018-03-23 (×7): qty 0.4

## 2018-03-23 MED ORDER — ALBUTEROL SULFATE (2.5 MG/3ML) 0.083% IN NEBU
2.5000 mg | INHALATION_SOLUTION | RESPIRATORY_TRACT | Status: DC | PRN
  Administered 2018-03-23 – 2018-03-28 (×5): 2.5 mg via RESPIRATORY_TRACT
  Filled 2018-03-23 (×4): qty 3

## 2018-03-23 MED ORDER — ACETAMINOPHEN-CODEINE #3 300-30 MG PO TABS
1.0000 | ORAL_TABLET | ORAL | Status: DC | PRN
  Administered 2018-03-23 – 2018-03-25 (×5): 1 via ORAL
  Filled 2018-03-23 (×5): qty 1

## 2018-03-23 MED ORDER — IPRATROPIUM BROMIDE 0.02 % IN SOLN
0.5000 mg | Freq: Once | RESPIRATORY_TRACT | Status: AC
  Administered 2018-03-23: 0.5 mg via RESPIRATORY_TRACT
  Filled 2018-03-23: qty 2.5

## 2018-03-23 MED ORDER — POLYETHYLENE GLYCOL 3350 17 G PO PACK
17.0000 g | PACK | Freq: Every day | ORAL | Status: DC
  Administered 2018-03-24: 17 g via ORAL
  Filled 2018-03-23 (×3): qty 1

## 2018-03-23 MED ORDER — IBUPROFEN 200 MG PO TABS: 600.0000 mg | ORAL_TABLET | Freq: Four times a day (QID) | ORAL | Status: DC | PRN

## 2018-03-23 MED ORDER — ONDANSETRON HCL 4 MG/2ML IJ SOLN: 4.0000 mg | Freq: Once | INTRAMUSCULAR | Status: DC | PRN

## 2018-03-23 MED ORDER — ALBUTEROL SULFATE (2.5 MG/3ML) 0.083% IN NEBU
2.5000 mg | INHALATION_SOLUTION | Freq: Three times a day (TID) | RESPIRATORY_TRACT | Status: DC
  Administered 2018-03-24 – 2018-03-25 (×3): 2.5 mg via RESPIRATORY_TRACT
  Filled 2018-03-23 (×4): qty 3

## 2018-03-23 MED ORDER — PANTOPRAZOLE SODIUM 40 MG PO TBEC
40.0000 mg | DELAYED_RELEASE_TABLET | Freq: Two times a day (BID) | ORAL | Status: DC
  Administered 2018-03-23 – 2018-03-29 (×12): 40 mg via ORAL
  Filled 2018-03-23 (×12): qty 1

## 2018-03-23 MED ORDER — AMITRIPTYLINE HCL 25 MG PO TABS
25.0000 mg | ORAL_TABLET | Freq: Every day | ORAL | Status: DC
  Administered 2018-03-23 – 2018-03-28 (×6): 25 mg via ORAL
  Filled 2018-03-23 (×6): qty 1

## 2018-03-23 MED ORDER — ALBUTEROL (5 MG/ML) CONTINUOUS INHALATION SOLN
10.0000 mg/h | INHALATION_SOLUTION | Freq: Once | RESPIRATORY_TRACT | Status: AC
  Administered 2018-03-23: 10 mg/h via RESPIRATORY_TRACT
  Filled 2018-03-23: qty 20

## 2018-03-23 MED ORDER — METHYLPREDNISOLONE SODIUM SUCC 125 MG IJ SOLR
125.0000 mg | Freq: Once | INTRAMUSCULAR | Status: AC
  Administered 2018-03-23: 125 mg via INTRAVENOUS
  Filled 2018-03-23: qty 2

## 2018-03-23 MED ORDER — VITAMIN D3 25 MCG (1000 UNIT) PO TABS
1000.0000 [IU] | ORAL_TABLET | Freq: Every day | ORAL | Status: DC
  Administered 2018-03-23 – 2018-03-29 (×7): 1000 [IU] via ORAL
  Filled 2018-03-23 (×7): qty 1

## 2018-03-23 MED ORDER — MAGNESIUM SULFATE 2 GM/50ML IV SOLN
2.0000 g | Freq: Once | INTRAVENOUS | Status: AC
  Administered 2018-03-23: 2 g via INTRAVENOUS
  Filled 2018-03-23: qty 50

## 2018-03-23 MED ORDER — LOSARTAN POTASSIUM 25 MG PO TABS
25.0000 mg | ORAL_TABLET | Freq: Every day | ORAL | Status: DC
  Administered 2018-03-23 – 2018-03-29 (×7): 25 mg via ORAL
  Filled 2018-03-23 (×8): qty 1

## 2018-03-23 MED ORDER — ALBUTEROL SULFATE (2.5 MG/3ML) 0.083% IN NEBU
5.0000 mg | INHALATION_SOLUTION | Freq: Once | RESPIRATORY_TRACT | Status: AC
  Administered 2018-03-23: 5 mg via RESPIRATORY_TRACT
  Filled 2018-03-23: qty 6

## 2018-03-23 MED ORDER — GUAIFENESIN ER 600 MG PO TB12
1200.0000 mg | ORAL_TABLET | Freq: Two times a day (BID) | ORAL | Status: DC
  Administered 2018-03-23 – 2018-03-29 (×13): 1200 mg via ORAL
  Filled 2018-03-23 (×13): qty 2

## 2018-03-23 MED ORDER — MOMETASONE FURO-FORMOTEROL FUM 100-5 MCG/ACT IN AERO
2.0000 | INHALATION_SPRAY | Freq: Two times a day (BID) | RESPIRATORY_TRACT | Status: DC
  Administered 2018-03-23 – 2018-03-25 (×4): 2 via RESPIRATORY_TRACT
  Filled 2018-03-23: qty 8.8

## 2018-03-23 MED ORDER — GUAIFENESIN-DM 100-10 MG/5ML PO SYRP
5.0000 mL | ORAL_SOLUTION | ORAL | Status: DC | PRN
  Administered 2018-03-23 – 2018-03-29 (×10): 5 mL via ORAL
  Filled 2018-03-23 (×11): qty 10

## 2018-03-23 MED ORDER — VENLAFAXINE HCL ER 75 MG PO CP24
75.0000 mg | ORAL_CAPSULE | Freq: Every day | ORAL | Status: DC
  Administered 2018-03-23 – 2018-03-28 (×6): 75 mg via ORAL
  Filled 2018-03-23 (×6): qty 1

## 2018-03-23 MED ORDER — INFLUENZA VAC SPLIT HIGH-DOSE 0.5 ML IM SUSY
0.5000 mL | PREFILLED_SYRINGE | INTRAMUSCULAR | Status: AC
  Administered 2018-03-24: 0.5 mL via INTRAMUSCULAR
  Filled 2018-03-23: qty 0.5

## 2018-03-23 NOTE — ED Provider Notes (Signed)
[facility] DEPT Provider Note   CSN: [id number] Arrival date & time: 03/23/18  4098     History   Chief Complaint Chief Complaint  Patient presents with  . Shortness of Breath    HPI Stacey [name] is a 77 y.o. female.  HPI  77 year old female with a history of chronic lung disease/interstitial lung disease, rheumatoid arthritis and bronchitis presents with shortness of breath.  Chronically is short of breath but does not normally require oxygen.  She has been feeling more short of breath than typical for about a week, severely so over the last 24 hours.  No fevers, chest pain, chest tightness, or vomiting.  Cough is minimally productive of green sputum.  Increased wheezing since last night.  Tried a breathing treatment just prior to EMS arrival with not much relief.  No leg swelling.  Past Medical History:  Diagnosis Date  . Abnormal finding of blood chemistry   . Asthma   . H/O measles   . H/O varicella   . Hypertension   . 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 Lichen sclerosus 11/91/47   Asymptomatic  . Low iron   . Mitral valve prolapse   . Osteoarthritis   . Osteoporosis   . Post herpetic neuralgia   . Rheumatoid arthritis(714.0)   . Yeast infection     Patient Active Problem List   Diagnosis Date Noted  . Asthma exacerbation 03/23/2018  . DOE (dyspnea on exertion) 03/21/2018  . 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 esophagitis 03/21/2018  . Chronic lung disease   . Hypoxia   . Dyspnea 01/23/2018  . ILD (interstitial lung disease) ([place]) 01/23/2018  . 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 Multiple pulmonary nodules/RA lung dz  05/29/2013  . Cough variant asthma vs uacs  05/29/2013  . Arthritis, rheumatoid ([place]) 01/13/2012  . Osteopenia 01/13/2012  . Lichen sclerosus et atrophicus of the vulva 01/13/2012  . Asthma 01/13/2012    Past Surgical History:  Procedure Laterality Date  . CHOLECYSTECTOMY  2011  .  WISDOM TOOTH EXTRACTION       OB History    Gravida  2   Para  2   Term  2   Preterm      AB      Living  2     SAB      TAB      Ectopic      Multiple      Live Births  2            Home Medications    Prior to Admission medications   Medication Sig Start Date End Date Taking? Authorizing Provider  abatacept (ORENCIA) 250 MG injection Inject 250 mg into the vein every 30 (thirty) days.    Yes [provider]  acetaminophen-codeine (TYLENOL #3) 300-30 MG tablet Take 1 tablet by mouth every 4 (four) hours as needed for up to 5 days (cough). 03/21/18 03/26/18 Yes Tanda Rockers, MD  albuterol (PROVENTIL) (2.5 MG/3ML) 0.083% nebulizer solution Inhale 2.5 mg into the lungs every 4 (four) hours as needed for wheezing or shortness of breath.    Yes [provider]  amitriptyline (ELAVIL) 25 MG tablet Take 25 mg by mouth at bedtime. 02/04/15  Yes [provider]  beta carotene w/minerals (OCUVITE) tablet Take 1 tablet by mouth daily.   Yes [provider]  cholecalciferol (VITAMIN D) 1000 UNITS tablet Take 1,000 Units by mouth daily.  Yes [provider]  dextromethorphan (DELSYM) 30 MG/5ML liquid Take 2.5 mLs (15 mg total) by mouth at bedtime. 01/27/18  Yes Lavina Hamman, MD  gabapentin (NEURONTIN) 300 MG capsule Take 600 mg by mouth at bedtime.   Yes [provider]  guaiFENesin (MUCINEX) 600 MG 12 hr tablet Take 600 mg by mouth every 12 (twelve) hours as needed for cough or to loosen phlegm.  01/27/18  Yes [provider]  ibuprofen (ADVIL,MOTRIN) 600 MG tablet Take 600 mg by mouth every 6 (six) hours as needed (For cough.).    Yes [provider]  levalbuterol Penne Lash) 0.63 MG/3ML nebulizer solution Inhale 0.63 mg into the lungs 2 (two) times daily as needed for wheezing or shortness of breath.  01/27/18  Yes [provider]  losartan (COZAAR) 25 MG tablet Take 25 mg by mouth daily. 02/04/15   Yes [provider]  methylPREDNISolone (MEDROL) 8 MG tablet Take 2 mg by mouth at bedtime.    Yes [provider]  metoCLOPramide (REGLAN) 5 MG tablet Take 1 tablet (5 mg total) by mouth 3 (three) times daily before meals. 03/21/18  Yes Tanda Rockers, MD  pantoprazole (PROTONIX) 40 MG tablet Take 1 tablet (40 mg total) by mouth 2 (two) times daily before a meal. Patient taking differently: Take 40 mg by mouth 2 (two) times daily as needed (For heartburn or acid reflux.).  01/27/18  Yes Lavina Hamman, MD  polyethylene glycol [facility] / GLYCOLAX) packet Take 17 g by mouth daily. Patient taking differently: Take 17 g by mouth daily as needed (For constipation.).  01/27/18  Yes Lavina Hamman, MD  traMADol (ULTRAM) 50 MG tablet Take 50 mg by mouth every 6 (six) hours as needed (For pain.).    Yes [provider]  venlafaxine XR (EFFEXOR-XR) 75 MG 24 hr capsule Take 75 mg by mouth at bedtime.    Yes [provider]    Family History Family History  Problem Relation Age of Onset  . Asthma Mother   . Anemia Mother   . COPD Father   . Pulmonary fibrosis Father   . Breast cancer Maternal Grandmother 88    Social History Social History   Tobacco Use  . Smoking status: Never Smoker  . Smokeless tobacco: Never Used  Substance Use Topics  . Alcohol use: No  . Drug use: No     Allergies   Penicillins; Remicade [infliximab]; Sulfa antibiotics; and Sulfamethoxazole   Review of Systems Review of Systems  Constitutional: Negative for fever.  Respiratory: Positive for cough, shortness of breath and wheezing. Negative for chest tightness.   Cardiovascular: Negative for chest pain and leg swelling.  Gastrointestinal: Negative for vomiting.  All other systems reviewed and are negative.    Physical Exam Updated Vital Signs BP (!) 165/68   Pulse 88   Temp 97.8 F (36.6 C) (Oral)   Resp 20   Ht 5\' 6"  (1.676 m)   Wt 63.2 kg   SpO2 96%   BMI 22.49  kg/m   Physical Exam  Constitutional: She is oriented to person, place, and time. She appears well-developed and well-nourished.  HENT:  Head: Normocephalic and atraumatic.  Right Ear: External ear normal.  Left Ear: External ear normal.  Nose: Nose normal.  Eyes: Right eye exhibits no discharge. Left eye exhibits no discharge.  Cardiovascular: Normal rate, regular rhythm and normal heart sounds.  Pulmonary/Chest: No accessory muscle usage. Tachypnea noted. She has wheezes (diffuse,  expiratory).  Abdominal: Soft. There is no tenderness.  Musculoskeletal:       Right lower leg: She exhibits no edema.       Left lower leg: She exhibits no edema.  Neurological: She is alert and oriented to person, place, and time.  Skin: Skin is warm and dry. She is not diaphoretic.  Nursing note and vitals reviewed.    ED Treatments / Results  Labs (all labs ordered are listed, but only abnormal results are displayed) Labs Reviewed  BASIC METABOLIC PANEL - Abnormal; Notable for the following components:      Result Value   Glucose, Bld 112 (*)    Calcium 8.8 (*)    All other components within normal limits  CBC WITH DIFFERENTIAL/PLATELET - Abnormal; Notable for the following components:   WBC 14.1 (*)    Lymphs Abs 4.6 (*)    Monocytes Absolute 1.5 (*)    Eosinophils Absolute 2.3 (*)    All other components within normal limits  TROPONIN I  BRAIN NATRIURETIC PEPTIDE    EKG EKG Interpretation  Date/Time:  Wednesday March 23 2018 07:32:42 EDT Ventricular Rate:  95 PR Interval:    QRS Duration: 92 QT Interval:  353 QTC Calculation: 444 R Axis:   49 Text Interpretation:  Normal sinus rhythm no acute ST/T changes no significant change since July 2019 Confirmed by Sherwood Gambler [phone]) on 03/23/2018 7:48:51 AM Also confirmed by Sherwood Gambler [phone]), editor Philomena Doheny [phone])  on 03/23/2018 8:03:06 AM   Radiology Dg Chest 2 View  Result Date: 03/21/2018 CLINICAL DATA:  Cough,  congestion, shortness of Breath EXAM: CHEST - 2 VIEW COMPARISON:  CT 03/07/2018.  Plain film 01/25/2018. FINDINGS: Chronic interstitial thickening noted in the lungs compatible with chronic interstitial lung disease/fibrosis, most pronounced in the apices and bases. No acute confluent airspace opacities. Heart is normal size. No effusions or acute bony abnormality. IMPRESSION: Chronic interstitial lung disease/fibrosis changes throughout the lungs. No acute findings. Electronically Signed   By: Rolm Baptise M.D.   On: 03/21/2018 14:48   Dg Chest Port 1 View  Result Date: 03/23/2018 CLINICAL DATA:  Shortness of breath EXAM: PORTABLE CHEST 1 VIEW COMPARISON:  March 21, 2018 FINDINGS: There is atelectatic change in the lung bases. There is no frank edema or consolidation. Heart is upper normal in size with pulmonary vascularity normal. No adenopathy. There is aortic atherosclerosis. No bone lesions. IMPRESSION: Bibasilar atelectasis. No edema or consolidation. Stable cardiac silhouette. There is aortic atherosclerosis. Aortic Atherosclerosis (ICD10-I70.0). Electronically Signed   By: Lowella Grip III M.D.   On: 03/23/2018 07:48    Procedures .Critical Care Performed by: Sherwood Gambler, MD Authorized by: Sherwood Gambler, MD   Critical care provider statement:    Critical care time (minutes):  35   Critical care time was exclusive of:  Separately billable procedures and treating other patients   Critical care was necessary to treat or prevent imminent or life-threatening deterioration of the following conditions:  Respiratory failure   Critical care was time spent personally by me on the following activities:  Development of treatment plan with patient or surrogate, discussions with consultants, evaluation of patient's response to treatment, examination of patient, obtaining history from patient or surrogate, ordering and performing treatments and interventions, ordering and review of laboratory  studies, ordering and review of radiographic studies, pulse oximetry, re-evaluation of patient's condition and review of old charts   (including critical care time)  Medications Ordered in ED Medications  ondansetron (  ZOFRAN) injection 4 mg (has no administration in time range)  albuterol (PROVENTIL) (2.5 MG/3ML) 0.083% nebulizer solution 5 mg (5 mg Nebulization Given 03/23/18 0713)  albuterol (PROVENTIL,VENTOLIN) solution continuous neb (10 mg/hr Nebulization Given 03/23/18 0749)  ipratropium (ATROVENT) nebulizer solution 0.5 mg (0.5 mg Nebulization Given 03/23/18 0749)  magnesium sulfate IVPB 2 g 50 mL (2 g Intravenous New Bag/Given 03/23/18 0811)  methylPREDNISolone sodium succinate (SOLU-MEDROL) 125 mg/2 mL injection 125 mg (125 mg Intravenous Given 03/23/18 0804)     Initial Impression / Assessment and Plan / ED Course  I have reviewed the triage vital signs and the nursing notes.  Pertinent labs & imaging results that were available during my care of the patient were reviewed by me and considered in my medical decision making (see chart for details).     Patient is initially hypoxic to 87% on room air on arrival to the ED.  She was given an albuterol neb by nursing and then a continuous albuterol nebulization.  She was given IV Solu-Medrol and IV magnesium.  I believe this is mostly an asthma exacerbation without obvious bacterial source.  However, she is still quite dyspneic and wheezing after the continuous treatment and thus will need to be admitted to the hospital.  Discussed with Dr. Verlon Au, who will admit and asked for patient to be taken off oxygen to see what her current room air status and to be ambulated.  Final Clinical Impressions(s) / ED Diagnoses   Final diagnoses:  Moderate persistent asthma with acute exacerbation    ED Discharge Orders    None       Sherwood Gambler, MD 03/23/18 [id number]

## 2018-03-23 NOTE — Assessment & Plan Note (Signed)
-   04/18/15 rec max rx for gerd > resolve - recurred Feb 2018 > max gerd rx added 09/25/2016  - 09/25/2016  After extensive coaching HFA effectiveness =    75% so try symbicort 80 2bid x one sample only  - improved on higher doses of pred and saba by report 10/22/2016 so rec medrol 4 mg double dose until better then resume previous dose  - flutter valve teaching done 10/22/2016  And repeated 01/17/2018 as well as neb training  - refractory flare onset 01/11/18 rec medrol up to 32 qd and cyclical cough rx with tyl#3  - DgEs  01/25/18   Spontaneous reflux to level of the midthoracic esophagus when patient is placed in the supine position - Asthma exac. 02/25/18  FENO 30, medrol taper (12mg  x 3 days, 8mg  x 3 days then cont 4mg  qd) - Allergy profile 03/21/18  >  Eos 1.1  /  IgE  5 rast neg   Severe refractory cough dating back to 01/10/18 in pt with spontaneous GERD by recent DgEs.  Of the three most common causes of  Sub-acute / recurrent or chronic cough, only one (GERD)  can actually contribute to/ trigger  the other two (asthma and post nasal drip syndrome)  and perpetuate the cylce of cough.  While not intuitively obvious, many patients with chronic low grade reflux do not cough until there is a primary insult that disturbs the protective epithelial barrier and exposes sensitive nerve endings.   This is typically viral but can due to PNDS and  either may apply here.  The point is that once this occurs, it is difficult to eliminate the cycle  using anything but a maximally effective acid suppression regimen at least in the short run, accompanied by an appropriate diet to address non acid GERD and control / eliminate the cough itself for at least 3 days with use of tylenol #3 if needed plus as much use of the flutter valve as possible    I had an extended discussion with the patienthusband reviewing all relevant studies completed to date and  lasting 25 minutes of a 40  minute post hosp/transition of care office   visit addressing    severe non-specific but potentially very serious refractory respiratory symptoms of uncertain and potentially multiple  etiologies.   Each maintenance medication was reviewed in detail including most importantly the difference between maintenance and prns and under what circumstances the prns are to be triggered using an action plan format that is not reflected in the computer generated alphabetically organized AVS.    Please see AVS for specific instructions unique to this office visit that I personally wrote and verbalized to the the pt in detail and then reviewed with pt  by my nurse highlighting any changes in therapy/plan of care  recommended at today's visit.

## 2018-03-23 NOTE — ED Notes (Signed)
ED TO INPATIENT HANDOFF REPORT  Name/Age/Gender Stacey [name] 77 y.o. female  Code Status Code Status History    Date Active Date Inactive Code Status Order ID Comments User Context   01/23/2018 0232 01/27/2018 2001 Full Code [id number]  Jani Gravel, MD ED      Home/SNF/Other Home  Chief Complaint shortness of breath  Level of Care/Admitting Diagnosis ED Disposition    ED Disposition Condition [facility] Area: [facility] [100102]  Level of Care: Telemetry [5]  Admit to tele based on following criteria: Other see comments  Comments: tele for 24 hours  Diagnosis: Asthma exacerbation [025427]  Admitting Physician: Nita Sells [[id number]]  Attending Physician: Nita Sells [[id number]]  PT Class (Do Not Modify): Observation [104]  PT Acc Code (Do Not Modify): Observation [10022]       Medical History Past Medical History:  Diagnosis Date  . Abnormal finding of blood chemistry   . Asthma   . H/O measles   . H/O varicella   . Hypertension   . 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 Lichen sclerosus 76/28/31   Asymptomatic  . Low iron   . Mitral valve prolapse   . Osteoarthritis   . Osteoporosis   . Post herpetic neuralgia   . Rheumatoid arthritis(714.0)   . Yeast infection     Allergies Allergies  Allergen Reactions  . Penicillins Other (See Comments)    Reaction unknown occurred during childhood Has patient had a PCN reaction causing immediate rash, facial/tongue/throat swelling, SOB or lightheadedness with hypotension: Unknown Has patient had a PCN reaction causing severe rash involving mucus membranes or skin necrosis: Unknown Has patient had a PCN reaction that required hospitalization: No Has patient had a PCN reaction occurring within the last 10 years: No If all of the above answers are "NO", then may proceed with Cephalosporin use.  Unsur  . Remicade [Infliximab] Other (See Comments)    Reaction unknown  . Sulfa  Antibiotics Other (See Comments)    Reaction unknown  . Sulfamethoxazole Other (See Comments)    Unsure of reaction As a child not sure     IV Location/Drains/Wounds Patient Lines/Drains/Airways Status   Active Line/Drains/Airways    Name:   Placement date:   Placement time:   Site:   Days:   Peripheral IV 01/24/18 Anterior;Proximal;Right Forearm   01/24/18    2200    Forearm   58   Peripheral IV 01/26/18 Left;Posterior Forearm   01/26/18    0701    Forearm   56   Peripheral IV 03/23/18 Left Forearm   03/23/18    0722    Forearm   less than 1          Labs/Imaging Results for orders placed or performed during the hospital encounter of 03/23/18 (from the past 48 hour(s))  Basic metabolic panel     Status: Abnormal   Collection Time: 03/23/18  7:31 AM  Result Value Ref Range   Sodium 141 135 - 145 mmol/L   Potassium 4.2 3.5 - 5.1 mmol/L   Chloride 101 98 - 111 mmol/L   CO2 29 22 - 32 mmol/L   Glucose, Bld 112 (H) 70 - 99 mg/dL   BUN 11 8 - 23 mg/dL   Creatinine, Ser 0.67 0.44 - 1.00 mg/dL   Calcium 8.8 (L) 8.9 - 10.3 mg/dL   GFR calc non Af Amer >60 >60 mL/min   GFR calc Af Amer >60 >60 mL/min  Comment: (NOTE) The eGFR has been calculated using the CKD EPI equation. This calculation has not been validated in all clinical situations. eGFR's persistently <60 mL/min signify possible Chronic Kidney Disease.    Anion gap 11 5 - 15    Comment: Performed at [facility], [place] [address], [place], [place] [address]  Troponin I     Status: None   Collection Time: 03/23/18  7:31 AM  Result Value Ref Range   Troponin I <0.03 <0.03 ng/mL    Comment: Performed at [facility], [place] [address], [place], [place] [address]  CBC with Differential     Status: Abnormal   Collection Time: 03/23/18  7:31 AM  Result Value Ref Range   WBC 14.1 (H) 4.0 - 10.5 K/uL   RBC 4.13 3.87 - 5.11 MIL/uL   Hemoglobin 12.5 12.0 - 15.0 g/dL   HCT 39.0 36.0 - 46.0  %   MCV 94.4 78.0 - 100.0 fL   MCH 30.3 26.0 - 34.0 pg   MCHC 32.1 30.0 - 36.0 g/dL   RDW 15.2 11.5 - 15.5 %   Platelets 375 150 - 400 K/uL   Neutrophils Relative % 39 %   Neutro Abs 5.6 1.7 - 7.7 K/uL   Lymphocytes Relative 33 %   Lymphs Abs 4.6 (H) 0.7 - 4.0 K/uL   Monocytes Relative 11 %   Monocytes Absolute 1.5 (H) 0.1 - 1.0 K/uL   Eosinophils Relative 16 %   Eosinophils Absolute 2.3 (H) 0.0 - 0.7 K/uL   Basophils Relative 1 %   Basophils Absolute 0.1 0.0 - 0.1 K/uL    Comment: Performed at [facility], [place] [address] [place], [place] [address]  Brain natriuretic peptide     Status: None   Collection Time: 03/23/18  7:32 AM  Result Value Ref Range   B Natriuretic Peptide 89.4 0.0 - 100.0 pg/mL    Comment: Performed at [facility], [place] [address] [place], [place] [address]   Dg Chest 2 View  Result Date: 03/21/2018 CLINICAL DATA:  Cough, congestion, shortness of Breath EXAM: CHEST - 2 VIEW COMPARISON:  CT 03/07/2018.  Plain film 01/25/2018. FINDINGS: Chronic interstitial thickening noted in the lungs compatible with chronic interstitial lung disease/fibrosis, most pronounced in the apices and bases. No acute confluent airspace opacities. Heart is normal size. No effusions or acute bony abnormality. IMPRESSION: Chronic interstitial lung disease/fibrosis changes throughout the lungs. No acute findings. Electronically Signed   By: Rolm Baptise M.D.   On: 03/21/2018 14:48   Dg Chest Port 1 View  Result Date: 03/23/2018 CLINICAL DATA:  Shortness of breath EXAM: PORTABLE CHEST 1 VIEW COMPARISON:  March 21, 2018 FINDINGS: There is atelectatic change in the lung bases. There is no frank edema or consolidation. Heart is upper normal in size with pulmonary vascularity normal. No adenopathy. There is aortic atherosclerosis. No bone lesions. IMPRESSION: Bibasilar atelectasis. No edema or consolidation. Stable cardiac silhouette. There is aortic  atherosclerosis. Aortic Atherosclerosis (ICD10-I70.0). Electronically Signed   By: Lowella Grip III M.D.   On: 03/23/2018 07:48    Pending Labs Unresulted Labs (From admission, onward)   None      Vitals/Pain Today's Vitals   03/23/18 0830 03/23/18 0900 03/23/18 0930 03/23/18 1000  BP: (!) 173/104 (!) 166/69 (!) 165/68 (!) 163/63  Pulse: 84 80 88 100  Resp: _0 (!) 27  Temp:      TempSrc:  SpO2: 98% 98% 96% (!) 87%  Weight:      Height:        Isolation Precautions No active isolations  Medications Medications  ondansetron (ZOFRAN) injection 4 mg (has no administration in time range)  albuterol (PROVENTIL) (2.5 MG/3ML) 0.083% nebulizer solution 5 mg (5 mg Nebulization Given 03/23/18 0713)  albuterol (PROVENTIL,VENTOLIN) solution continuous neb (10 mg/hr Nebulization Given 03/23/18 0749)  ipratropium (ATROVENT) nebulizer solution 0.5 mg (0.5 mg Nebulization Given 03/23/18 0749)  magnesium sulfate IVPB 2 g 50 mL (0 g Intravenous Stopped 03/23/18 0911)  methylPREDNISolone sodium succinate (SOLU-MEDROL) 125 mg/2 mL injection 125 mg (125 mg Intravenous Given 03/23/18 0804)    Mobility walks

## 2018-03-23 NOTE — Evaluation (Signed)
Physical Therapy Evaluation Patient Details Name: Stacey [name] MRN: [id number] DOB: [birth year] Today's Date: 03/23/2018   History of Present Illness  Pt is a 77 year old female with history of chronic lung disease/interstitial lung disease, rheumatoid arthritis and bronchitis and admitted for acute hypoxic respiratory failure secondary to asthma exacerbation  Clinical Impression  Pt admitted with above diagnosis. Pt currently with functional limitations due to the deficits listed below (see PT Problem List).  Pt will benefit from skilled PT to increase their independence and safety with mobility to allow discharge to the venue listed below.   Pt requesting to use bathroom upon PT arrival, so PT assisted pt into bathroom (lower garments saturated with urine).  Pt with increased work of breathing and coughing upon ambulating into bathroom and required long seated break prior to being able to ambulate back to bed.  Pt remained on 2L O2 [place] and SpO2 93%.  Will continue to assist pt with mobility in acute setting.     Follow Up Recommendations Home health PT(pending improvement)    Equipment Recommendations  Rolling walker with 5" wheels    Recommendations for Other Services       Precautions / Restrictions Precautions Precautions: Fall Precaution Comments: monitor sats Restrictions Weight Bearing Restrictions: No      Mobility  Bed Mobility Overal bed mobility: Needs Assistance Bed Mobility: Supine to Sit;Sit to Supine     Supine to sit: Min guard Sit to supine: Min guard   General bed mobility comments: increased time, dyspnea with any movement, rest break upon sitting EOB  Transfers Overall transfer level: Needs assistance Equipment used: None Transfers: Sit to/from Stand Sit to Stand: Min assist         General transfer comment: verbal cues for hand placement to self assist  Ambulation/Gait Ambulation/Gait assistance: Min assist Gait Distance (Feet): 10  Feet(total) Assistive device: None Gait Pattern/deviations: Step-through pattern;Decreased stride length     General Gait Details: pt requested to use bathroom, assisted to toilet and pt with increased work of breathing and coughing; required seated rest break and encouraged slow breathing while on toilet as pt felt unable to move; pt able to be assisted back to bed ambulating and UEs reaching for furniture, walls; pt remained on 2L O2 [place] and SPO2 93%  Stairs            Wheelchair Mobility    Modified Rankin (Stroke Patients Only)       Balance Overall balance assessment: Needs assistance         Standing balance support: Single extremity supported Standing balance-Leahy Scale: Poor Standing balance comment: requiring UE support at this time                             Pertinent Vitals/Pain Pain Assessment: No/denies pain    Home Living Family/patient expects to be discharged to:: Private residence Living Arrangements: Spouse/significant other   Type of Home: House       Home Layout: Able to live on main level with bedroom/bathroom Home Equipment: None      Prior Function Level of Independence: Independent               Hand Dominance        Extremity/Trunk Assessment        Lower Extremity Assessment Lower Extremity Assessment: Generalized weakness       Communication   Communication: No difficulties  Cognition Arousal/Alertness: Awake/alert  Behavior During Therapy: WFL for tasks assessed/performed Overall Cognitive Status: Within Functional Limits for tasks assessed                                        General Comments      Exercises     Assessment/Plan    PT Assessment Patient needs continued PT services  PT Problem List Decreased strength;Decreased mobility;Decreased activity tolerance;Decreased knowledge of use of DME;Cardiopulmonary status limiting activity       PT Treatment Interventions  DME instruction;Therapeutic activities;Gait training;Therapeutic exercise;Patient/family education;Functional mobility training;Stair training    PT Goals (Current goals can be found in the Care Plan section)  Acute Rehab PT Goals PT Goal Formulation: With patient Time For Goal Achievement: 04/06/18 Potential to Achieve Goals: Good    Frequency Min 3X/week   Barriers to discharge        Co-evaluation               AM-PAC PT "6 Clicks" Daily Activity  Outcome Measure Difficulty turning over in bed (including adjusting bedclothes, sheets and blankets)?: None Difficulty moving from lying on back to sitting on the side of the bed? : A Lot Difficulty sitting down on and standing up from a chair with arms (e.g., wheelchair, bedside commode, etc,.)?: Unable Help needed moving to and from a bed to chair (including a wheelchair)?: A Little Help needed walking in hospital room?: A Little Help needed climbing 3-5 steps with a railing? : A Lot 6 Click Score: 15    End of Session Equipment Utilized During Treatment: Oxygen Activity Tolerance: Patient limited by fatigue(limited by dyspnea and coughing) Patient left: in bed;with bed alarm set;with call bell/phone within reach;with family/visitor present Nurse Communication: Mobility status PT Visit Diagnosis: Difficulty in walking, not elsewhere classified (R26.2)    Time: 8768-1157 PT Time Calculation (min) (ACUTE ONLY): 17 min   Charges:   PT Evaluation $PT Eval Low Complexity: [place], PT, DPT Acute Rehabilitation Services Office: [phone] Pager: [phone]  Trena Platt 03/23/2018, 3:13 PM

## 2018-03-23 NOTE — ED Triage Notes (Signed)
Pt arrived by EMS from home. Pt has reports that she started feeling extreme short of breath this morning. Pt has hx of rheumatoid arthritis in her lungs. Pt is labored breathing on arrival. Oxygen saturation at 96% on 2L nasal [place]

## 2018-03-23 NOTE — ED Notes (Signed)
ED Provider at bedside. 

## 2018-03-23 NOTE — ED Notes (Signed)
Bed: WA20 Expected date:  Expected time:  Means of arrival:  Comments: Short of breath 

## 2018-03-23 NOTE — Assessment & Plan Note (Signed)
No  Evidence of chf/ anemia or thyroid dz

## 2018-03-23 NOTE — Assessment & Plan Note (Addendum)
DgEs  01/25/18    Spontaneous reflux to level of the midthoracic esophagus when patient is placed in the supine position   rec ppi bi and reglan 5 mg ac until next ov as well as elevated hob / bed blocks if possible and GI consultation done   Discussed in detail all the  indications, usual  risks and alternatives  relative to the benefits with patient who agrees to proceed with rx short term  as outlined.

## 2018-03-23 NOTE — ED Notes (Signed)
Report given to Rachel RN

## 2018-03-23 NOTE — H&P (Addendum)
HPI  Stacey [name] JXB:[id number] DOB: 1941-03-18 DOA: 03/23/2018  PCP: Shon Baton, MD   Chief Complaint: SOB, cough and insomnia x 3 days  HPI:  77 year old female with rheumatoid arthritis ILD, asthma diagnosed 2000 treated currently at [facility] by rheumatologist Asthma diagnosed with allergies as a child-has been on prednisone for decades when previously evaluated by Dr. Noel Christmas on Orencia 2014 September for rheumatoid issues  Recently seen at pulmonology office 03/21/2018-noted respiration worse-mMRC 3 category on flat grade and was on Protonix in addition to reflux diet  Recently admitted to hospital 01/2018 with dyspnea hypoxemia-at the time echo EF 55-60% with grade 1 diastolic dysfunction-Esophagram was done and patient was placed by pulmonary on Reglan as OP  Hus[place] relates that [place] and [place] is [place] current issues started-on going to [facility] on 9/4 was difficult for the patient-at baseline she is ambulatory without oxygen or without assistive aid except when she was discharged back in [place] she is not winded and is able to walk flat distances without too much distress   Returns to ED today with further issues--insomnia X 2 days since being seen at pulmonology office visit-was prescribed 8 mg prednisone at that visit, Hycodan was changed to Tylenol 3 to help with work of breathing-another transitional care management appointment was made for the patient to determine her polypharmacy and the need for the same   ED Course: Patient given continued patient given continuous neb in the emergency room and given magnesium-when I went down to see her she was off oxygen and when she ambulated for 5 steps she felt fearful and desatted to the high 80s with increased work of breathing 30 times per minute which resolved after about 5 minutes of deep pursed lip breathing   Glucose 112, WBC 14, lymphocytosis 4.6 BNP 89.4,  troponin 0 0.03, 1 view chest x-ray bibasilar atelectasis no edema consolidation, EKG showed sinus rhythm with artifact PR interval 0.08 QRS axis about 30 degrees no ischemic injury  Review of Systems:  Tells me she has had blurred vision and double vision for the past year and sees ophthalmology [facility] This affects her driving to some degree No chest pain no arm pain no dark stool no tarry stool no rash no fever recently  Past Medical History:  Diagnosis Date  . Abnormal finding of blood chemistry   . Asthma   . H/O measles   . H/O varicella   . Hypertension   . 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 Lichen sclerosus 13/08/65   Asymptomatic  . Low iron   . Mitral valve prolapse   . Osteoarthritis   . Osteoporosis   . Post herpetic neuralgia   . Rheumatoid arthritis(714.0)   . Yeast infection     Past Surgical History:  Procedure Laterality Date  . CHOLECYSTECTOMY  2011  . WISDOM TOOTH EXTRACTION       reports that she has never smoked. She has never used smokeless tobacco. She reports that she does not drink alcohol or use drugs. Mobility: Independent at baseline  Allergies  Allergen Reactions  . Penicillins Other (See Comments)    Reaction unknown occurred during childhood Has patient had a PCN reaction causing immediate rash, facial/tongue/throat swelling, SOB or lightheadedness with hypotension: Unknown Has patient had a PCN reaction causing severe rash involving mucus membranes or skin necrosis: Unknown Has patient had a PCN reaction that required hospitalization: No Has patient had a PCN  reaction occurring within the last 10 years: No If all of the above answers are "NO", then may proceed with Cephalosporin use.  Unsur  . Remicade [Infliximab] Other (See Comments)    Reaction unknown  . Sulfa Antibiotics Other (See Comments)    Reaction unknown  . Sulfamethoxazole Other (See Comments)    Unsure of reaction As a child not sure     Family History  Problem  Relation Age of Onset  . Asthma Mother   . Anemia Mother   . COPD Father   . Pulmonary fibrosis Father   . 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 Authorizing Provider  abatacept (ORENCIA) 250 MG injection Inject 250 mg into the vein every 30 (thirty) days.    Yes [provider]  acetaminophen-codeine (TYLENOL #3) 300-30 MG tablet Take 1 tablet by mouth every 4 (four) hours as needed for up to 5 days (cough). 03/21/18 03/26/18 Yes Tanda Rockers, MD  albuterol (PROVENTIL) (2.5 MG/3ML) 0.083% nebulizer solution Inhale 2.5 mg into the lungs every 4 (four) hours as needed for wheezing or shortness of breath.    Yes [provider]  amitriptyline (ELAVIL) 25 MG tablet Take 25 mg by mouth at bedtime. 02/04/15  Yes [provider]  beta carotene w/minerals (OCUVITE) tablet Take 1 tablet by mouth daily.   Yes [provider]  cholecalciferol (VITAMIN D) 1000 UNITS tablet Take 1,000 Units by mouth daily.   Yes [provider]  dextromethorphan (DELSYM) 30 MG/5ML liquid Take 2.5 mLs (15 mg total) by mouth at bedtime. 01/27/18  Yes Lavina Hamman, MD  gabapentin (NEURONTIN) 300 MG capsule Take 600 mg by mouth at bedtime.   Yes [provider]  guaiFENesin (MUCINEX) 600 MG 12 hr tablet Take 600 mg by mouth every 12 (twelve) hours as needed for cough or to loosen phlegm.  01/27/18  Yes [provider]  ibuprofen (ADVIL,MOTRIN) 600 MG tablet Take 600 mg by mouth every 6 (six) hours as needed (For cough.).    Yes [provider]  levalbuterol Penne Lash) 0.63 MG/3ML nebulizer  solution Inhale 0.63 mg into the lungs 2 (two) times daily as needed for wheezing or shortness of breath.  01/27/18  Yes [provider]  losartan (COZAAR) 25 MG tablet Take 25 mg by mouth daily. 02/04/15  Yes [provider]  methylPREDNISolone (MEDROL) 8 MG tablet Take 2 mg by mouth at bedtime.    Yes [provider]  metoCLOPramide (REGLAN) 5 MG tablet Take 1 tablet (5 mg total) by mouth 3 (three) times daily before meals. 03/21/18  Yes Tanda Rockers, MD  pantoprazole (PROTONIX) 40 MG tablet Take 1 tablet (40 mg total) by mouth 2 (two) times daily before a meal. Patient taking differently: Take 40 mg by mouth 2 (two) times daily as needed (For heartburn or acid reflux.).  01/27/18  Yes Lavina Hamman, MD  polyethylene glycol [facility] / GLYCOLAX) packet Take 17 g by mouth daily. Patient taking differently: Take 17 g by mouth daily as needed (For constipation.).  01/27/18  Yes Lavina Hamman, MD  traMADol Veatrice Bourbon)  50 MG tablet Take 50 mg by mouth every 6 (six) hours as needed (For pain.).    Yes [provider]  venlafaxine XR (EFFEXOR-XR) 75 MG 24 hr capsule Take 75 mg by mouth at bedtime.    Yes [provider]    Physical Exam:  Vitals:   03/23/18 0900 03/23/18 0930  BP: (!) 166/69 (!) 165/68  Pulse: 80 88  Resp: 18 20  Temp:    SpO2: 98% 96%     Frail asthenic Caucasian female no distress breathing about 30 times a minute  No icterus no pallor  Supraclavicular and by temporalis wasting  Chest is wheezy throughout posterolaterally without rales or rhonchi  S1-S2 no murmur  Abdomen is soft nontender no rebound no guarding  No lower extremity edema  Gait is steady although she is not confident with her gait  Power is 5/5 bilaterally but limited by weakness  I have personally reviewed following labs and imaging studies  Labs:   See above  Imaging studies:   As above  Medical tests:   EKG independently reviewed: As  above  Test discussed with performing physician:  y-Dr. Verner Chol  Decision to obtain old records:   y   Review and summation of old records:   y   Active Problems:   Asthma exacerbation   Assessment/Plan Acute asthma/exacerbation of underlying ILD Rheumatoid lung? Weight loss in the elderly?  Adult failure to thrive Acute hypoxemic respiratory failure Leukocytosis probably secondary to steroid use Mild glucose intolerance   Acute hypoxic respiratory failure secondary to asthma exacerbation-she will need at least every 4 hourly nebulizations-I would keep her on telemetry-she will also need resumption of her home meds It does not appear that she is on S ABA or LABA other than every 8 hourly at home for exacerbations-I will order [facility] and we can consider Anoro Ellipta if this seems to help  needs stress dosing of steroids IV at least for the next day or 2 given her recent use of prednisone She will need pulse ox checks every 4 hourly RT to see her with flutter valve and incentive spirometer and careful monitoring on telemetry as I think at this time she is stable Control with Tylenol 3 cough-okay to resume ibuprofen every 6 as needed pain from coughing Increase guaifenesin to 1200 twice daily  Rheumatoid lung, currently on Orencia 250 q. Monthly-not see any recent labs to denote activity of her rheumatoid arthritis-review of the chart reveals complement levels as well as sed rates in 2005-will order at this juncture anti-CCP antibody As An outpatient will need DAS-28 at rheumatology office  Adult failure to thrive-multifactorial-husband mentions she has not been eating much and prefers soups and other textures-may benefit from outpatient GI input if this continues as at last hospital admission patient was diagnosed with presbyesophagus And continue Reglan for now  Glucose intolerance-likely secondary to steroid use-sliding scale if above 140  Reflux, continue Protonix twice  daily--note that the dosing currently is as per her pulmonologist Dr. Melvyn Novas  HTN continue Cozaar 25 daily  Depression/bipolar continue amitriptyline 25, Effexor 75 at bedtime  Poor vision monitored at [facility]-continue Ocuvite    Severity of Illness: The appropriate patient status for this patient is INPATIENT. Inpatient status is judged to be reasonable and necessary in order to provide the required intensity of service to ensure the patient's safety. The patient's presenting symptoms, physical exam findings, and initial radiographic and laboratory data in the context of their chronic comorbidities  is felt to place them at high risk for further clinical deterioration. Furthermore, it is not anticipated that the patient will be medically stable for discharge from the hospital within 2 midnights of admission. The following factors support the patient status of inpatient.   " The patient's presenting symptoms include cough wheeze severe shortness of breath out of the ordinary. " The worrisome physical exam findings include severe wheeze and inability to catch breath. " The initial radiographic and laboratory data are worrisome because of concern for worsening. " The chronic co-morbidities include multiple as above.   * I certify that at the point of admission it is my clinical judgment that the patient will require inpatient hospital care spanning beyond 2 midnights from the point of admission due to high intensity of service, high risk for further deterioration and high frequency of surveillance required.*  Lovenox, inpatient, full code, d/w husband in detail bedside--expect 3-4 days  Needs therapy eval Cc Dr.s Wert/Russo  Time spent: 28 minutes  Kesleigh Morson, MD  [facility] Direct contact: [phone] --[place] [place]  --[email or web address]; password [email or web address]  7PM-7AM contact night coverage as above  03/23/2018, 10:14 AM

## 2018-03-23 NOTE — ED Notes (Signed)
Pt ambulated from bed to room door. Pt's O2 sats dropped to as low as 90% .

## 2018-03-23 NOTE — Assessment & Plan Note (Signed)
Can't be sure about orencia side effects potential here but it's now been stopped and note ESR quite high so rec double medrol dose until better then back to baseline dosage unless/until o/w rec by rheumatology

## 2018-03-24 ENCOUNTER — Inpatient Hospital Stay (HOSPITAL_COMMUNITY): Payer: Medicare Other

## 2018-03-24 DIAGNOSIS — F32A Depression, unspecified: Secondary | ICD-10-CM

## 2018-03-24 DIAGNOSIS — I10 Essential (primary) hypertension: Secondary | ICD-10-CM

## 2018-03-24 DIAGNOSIS — J4541 Moderate persistent asthma with (acute) exacerbation: Principal | ICD-10-CM

## 2018-03-24 DIAGNOSIS — J45901 Unspecified asthma with (acute) exacerbation: Secondary | ICD-10-CM

## 2018-03-24 DIAGNOSIS — F329 Major depressive disorder, single episode, unspecified: Secondary | ICD-10-CM

## 2018-03-24 LAB — RESPIRATORY PANEL BY PCR

## 2018-03-24 LAB — PROCALCITONIN: Procalcitonin: <0.1 ng/mL

## 2018-03-24 LAB — COMPREHENSIVE METABOLIC PANEL
ALT: 14 U/L (ref 0–44)
AST: 18 U/L (ref 15–41)
Albumin: 3 g/dL — ABNORMAL LOW (ref 3.5–5.0)
Alkaline Phosphatase: 50 U/L (ref 38–126)
Anion gap: 7 (ref 5–15)
BUN: 12 mg/dL (ref 8–23)
CO2: 32 mmol/L (ref 22–32)
Calcium: 8.6 mg/dL — ABNORMAL LOW (ref 8.9–10.3)
Chloride: 100 mmol/L (ref 98–111)
Creatinine, Ser: 0.6 mg/dL (ref 0.44–1.00)
GFR calc Af Amer: >60 mL/min (ref >60)
GFR calc non Af Amer: >60 mL/min (ref >60)
Glucose, Bld: 91 mg/dL (ref 70–99)
Potassium: 4.2 mmol/L (ref 3.5–5.1)
Sodium: 139 mmol/L (ref 135–145)
Total Bilirubin: 0.4 mg/dL (ref 0.3–1.2)
Total Protein: 6 g/dL — ABNORMAL LOW (ref 6.5–8.1)

## 2018-03-24 LAB — CYCLIC CITRUL PEPTIDE ANTIBODY, IGG/IGA: CCP Antibodies IgG/IgA: 4 units (ref 0–19)

## 2018-03-24 LAB — CBC WITH DIFFERENTIAL/PLATELET
Basophils Absolute: 0 10*3/uL (ref 0.0–0.1)
Basophils Relative: 0 %
Eosinophils Absolute: 0.7 10*3/uL (ref 0.0–0.7)
Eosinophils Relative: 6 %
HCT: 36.1 % (ref 36.0–46.0)
Hemoglobin: 11.5 g/dL — ABNORMAL LOW (ref 12.0–15.0)
Lymphocytes Relative: 26 %
Lymphs Abs: 3.1 10*3/uL (ref 0.7–4.0)
MCH: 29.9 pg (ref 26.0–34.0)
MCHC: 31.9 g/dL (ref 30.0–36.0)
MCV: 94 fL (ref 78.0–100.0)
Monocytes Absolute: 1.7 10*3/uL — ABNORMAL HIGH (ref 0.1–1.0)
Monocytes Relative: 14 %
Neutro Abs: 6.4 10*3/uL (ref 1.7–7.7)
Neutrophils Relative %: 54 %
Platelets: 324 10*3/uL (ref 150–400)
RBC: 3.84 MIL/uL — ABNORMAL LOW (ref 3.87–5.11)
RDW: 15.1 % (ref 11.5–15.5)
WBC: 12 10*3/uL — ABNORMAL HIGH (ref 4.0–10.5)

## 2018-03-24 LAB — HIV ANTIBODY (ROUTINE TESTING W REFLEX): HIV Screen 4th Generation wRfx: NONREACTIVE

## 2018-03-24 LAB — INFLUENZA PANEL BY PCR (TYPE A & B)
Influenza A By PCR: NEGATIVE
Influenza B By PCR: NEGATIVE

## 2018-03-24 LAB — GLUCOSE, CAPILLARY: Glucose-Capillary: 82 mg/dL (ref 70–99)

## 2018-03-24 MED ORDER — IOPAMIDOL (ISOVUE-370) INJECTION 76%
INTRAVENOUS | Status: AC
  Administered 2018-03-24: 15:00:00
  Filled 2018-03-24: qty 100

## 2018-03-24 MED ORDER — ALPRAZOLAM 0.5 MG PO TABS
0.5000 mg | ORAL_TABLET | Freq: Once | ORAL | Status: AC
  Administered 2018-03-24: 0.5 mg via ORAL
  Filled 2018-03-24: qty 1

## 2018-03-24 MED ORDER — VANCOMYCIN HCL 10 G IV SOLR
1500.0000 mg | Freq: Once | INTRAVENOUS | Status: AC
  Administered 2018-03-24: 1500 mg via INTRAVENOUS
  Filled 2018-03-24: qty 1500

## 2018-03-24 MED ORDER — VANCOMYCIN HCL IN DEXTROSE 1-5 GM/200ML-% IV SOLN: 1000.0000 mg | INTRAVENOUS | Status: DC

## 2018-03-24 MED ORDER — IOPAMIDOL (ISOVUE-370) INJECTION 76%
100.0000 mL | Freq: Once | INTRAVENOUS | Status: AC | PRN
  Administered 2018-03-24: 100 mL via INTRAVENOUS

## 2018-03-24 MED ORDER — SODIUM CHLORIDE 0.9 % IV SOLN
INTRAVENOUS | Status: DC | PRN
  Administered 2018-03-24: 250 mL via INTRAVENOUS

## 2018-03-24 MED ORDER — SODIUM CHLORIDE 0.9 % IV SOLN
2.0000 g | Freq: Three times a day (TID) | INTRAVENOUS | Status: DC
  Administered 2018-03-24 – 2018-03-25 (×2): 2 g via INTRAVENOUS
  Filled 2018-03-24 (×3): qty 2

## 2018-03-24 MED ORDER — ENSURE ENLIVE PO LIQD
237.0000 mL | Freq: Three times a day (TID) | ORAL | Status: DC
  Administered 2018-03-24 – 2018-03-28 (×9): 237 mL via ORAL

## 2018-03-24 MED ORDER — METHYLPREDNISOLONE SODIUM SUCC 125 MG IJ SOLR
60.0000 mg | Freq: Four times a day (QID) | INTRAMUSCULAR | Status: DC
  Administered 2018-03-24 – 2018-03-27 (×11): 60 mg via INTRAVENOUS
  Filled 2018-03-24 (×11): qty 2

## 2018-03-24 MED ORDER — LORAZEPAM 2 MG/ML IJ SOLN
0.5000 mg | Freq: Four times a day (QID) | INTRAMUSCULAR | Status: DC | PRN
  Administered 2018-03-24: 0.5 mg via INTRAVENOUS
  Filled 2018-03-24: qty 1

## 2018-03-24 NOTE — Progress Notes (Signed)
PROGRESS NOTE    Stacey [name]  KYH:[id number] DOB: 25-Feb-1941 DOA: 03/23/2018 PCP: Shon Baton, MD  Brief Narrative:77 year old female with rheumatoid arthritis ILD, asthma diagnosed 2000 treated currently at [facility] by rheumatologist Asthma diagnosed with allergies as a child-has been on prednisone for decades when previously evaluated by Dr. Noel Christmas on [place] 2014 September for rheumatoid issues  Recently seen at pulmonology office 03/21/2018-noted respiration worse-mMRC 3 category on flat grade and was on Protonix in addition to reflux diet  Recently admitted to hospital 01/2018 with dyspnea hypoxemia-at the time echo EF 55-60% with grade 1 diastolic dysfunction-Esophagram was done and patient was placed by pulmonary on Reglan as OP  Hus[place] that [place] and [place] current issues started-on going to [facility] on 9/4 was difficult for the patient-at baseline she is ambulatory without oxygen or without assistive aid except when she was discharged back in [place] she is not winded and is able to walk flat distances without too much distress   Returns to ED today with further issues--insomnia X 2 days since being seen at pulmonology office visit-was prescribed 8 mg prednisone at that visit, Hycodan was changed to Tylenol 3 to help with work of breathing-another transitional care management appointment was made for the patient to determine her polypharmacy and the need for the same  ED Course: Patient given continued patient given continuous neb in the emergency room and given magnesium-when I went down to see her she was off oxygen and when she ambulated for 5 steps she felt fearful and desatted to the high 80s with increased work of breathing 30 times per minute which resolved after about 5 minutes of deep pursed lip breathing   Glucose 112, WBC 14, lymphocytosis 4.6 BNP 89.4, troponin 0 0.03, 1 view chest x-ray  bibasilar atelectasis no edema consolidation, EKG showed sinus rhythm with artifact PR interval 0.08 QRS axis about 30 degrees no ischemic injury  Assessment & Plan:   Active Problems:   Asthma exacerbation  1] due to hypoxic respiratory failure with interstitial lung disease and rheumatoid lung and asthma -she admitted with complaints of dyspnea on exertion worsening shortness of breath and wheezing.  Her husband tells me that she had asthma probably as a child but never had or has been told she has asthma or not that he knows of.  Patient is very anxious and fearful to move to eat or talk because of hypoxia.  She walked from the bed to the door and her saturation dropped on 4 L of oxygen to 90%.  I will order a stat CT of the chest to rule out PE and to look at her interstitium.  Add IV steroids, patient with diffuse wheezing bilateral.  I will add Ativan for anxiety attacks.  Continue nebulizer treatments.  Continue Protonix for GERD.  She reports that she did not know she had GERD.  Chest x-ray 911 no acute findings.  Patient is on 4 L of oxygen at this time she does not have oxygen prior to admission to the hospital.  2]HTN continue antihypertensives like she was taking at home blood pressure is up and down.  Hydralazine PRN.  3] depression/bipolar disorder Home medications.   DVT prophylaxis Lovenox code Status:FULL Family Communication DW husband. Disposition Plan: Pending clinical improvement.  Consultants:  None Procedures: None Antimicrobials None Subjective: Patient very anxious reports she tried to drink some water and started coughing and wheezing.  Complains of extreme shortness  of breath no chest pain no nausea vomiting.  On 4 L of oxygen.   Objective: Vitals:   03/23/18 2057 03/24/18 0535 03/24/18 0740 03/24/18 1236  BP: (!) 171/91 119/61  (!) 151/83  Pulse: 98 72  89  Resp: 18 18    Temp: (!) 97.3 F (36.3 C) 98.3 F (36.8 C)  98 F (36.7 C)  TempSrc: Oral Oral   Oral  SpO2: 95% 98% 96% 93%  Weight:      Height:        Intake/Output Summary (Last 24 hours) at 03/24/2018 1343 Last data filed at 03/24/2018 7741 Gross per 24 hour  Intake -  Output 100 ml  Net -100 ml   Filed Weights   03/23/18 0708 03/23/18 1050  Weight: 63.2 kg 63.7 kg    Examination:  General exam: Appears in mild respiratory distress tachypneic tachycardic. Respiratory system:BI Lateral wheezing to auscultation. Respiratory effort tachypneic. Cardiovascular system: S1 & S2 heard, RRR. No JVD, murmurs, rubs, gallops or clicks. No pedal edema. Gastrointestinal system: Abdomen is nondistended, soft and nontender. No organomegaly or masses felt. Normal bowel sounds heard. Central nervous system: Alert and oriented. No focal neurological deficits. Extremities: Symmetric 5 x 5 power. Skin: No rashes, lesions or ulcers Psychiatry: Judgement and insight appear normal. Mood & affect appropriate.     Data Reviewed: I have personally reviewed following labs and imaging studies  CBC: Recent Labs  Lab 03/21/18 1119 03/23/18 0731 03/23/18 1101 03/24/18 0508  WBC 10.7* 14.1* 13.8* 12.0*  NEUTROABS 6.3 5.6  --  6.4  HGB 12.5 12.5 12.5 11.5*  HCT 37.4 39.0 39.6 36.1  MCV 89.9 94.4 95.0 94.0  PLT 358.0 375 367 287   Basic Metabolic Panel: Recent Labs  Lab 03/21/18 1119 03/23/18 0731 03/23/18 1101 03/24/18 0508  NA 137 141  --  139  K 4.3 4.2  --  4.2  CL 99 101  --  100  CO2 32 29  --  32  GLUCOSE 102* 112*  --  91  BUN 14 11  --  12  CREATININE 0.87 0.67 0.76 0.60  CALCIUM 9.0 8.8*  --  8.6*   GFR: Estimated Creatinine Clearance: 54.3 mL/min (by C-G formula based on SCr of 0.6 mg/dL). Liver Function Tests: Recent Labs  Lab 03/24/18 0508  AST 18  ALT 14  ALKPHOS 50  BILITOT 0.4  PROT 6.0*  ALBUMIN 3.0*   No results for input(s): LIPASE, AMYLASE in the last 168 hours. No results for input(s): AMMONIA in the last 168 hours. Coagulation Profile: No  results for input(s): INR, PROTIME in the last 168 hours. Cardiac Enzymes: Recent Labs  Lab 03/23/18 0731  TROPONINI <0.03   BNP (last 3 results) Recent Labs    03/21/18 1119  PROBNP 38.0   HbA1C: No results for input(s): HGBA1C in the last 72 hours. CBG: Recent Labs  Lab 03/24/18 0739  GLUCAP 82   Lipid Profile: No results for input(s): CHOL, HDL, LDLCALC, TRIG, CHOLHDL, LDLDIRECT in the last 72 hours. Thyroid Function Tests: No results for input(s): TSH, T4TOTAL, FREET4, T3FREE, THYROIDAB in the last 72 hours. Anemia Panel: No results for input(s): VITAMINB12, FOLATE, FERRITIN, TIBC, IRON, RETICCTPCT in the last 72 hours. Sepsis Labs: No results for input(s): PROCALCITON, LATICACIDVEN in the last 168 hours.  No results found for this or any previous visit (from the past 240 hour(s)).       Radiology Studies: Dg Chest Port 1 View  Result Date:  03/23/2018 CLINICAL DATA:  Shortness of breath EXAM: PORTABLE CHEST 1 VIEW COMPARISON:  March 21, 2018 FINDINGS: There is atelectatic change in the lung bases. There is no frank edema or consolidation. Heart is upper normal in size with pulmonary vascularity normal. No adenopathy. There is aortic atherosclerosis. No bone lesions. IMPRESSION: Bibasilar atelectasis. No edema or consolidation. Stable cardiac silhouette. There is aortic atherosclerosis. Aortic Atherosclerosis (ICD10-I70.0). Electronically Signed   By: Lowella Grip III M.D.   On: 03/23/2018 07:48        Scheduled Meds: . albuterol  2.5 mg Nebulization TID  . amitriptyline  25 mg Oral QHS  . cholecalciferol  1,000 Units Oral Daily  . enoxaparin (LOVENOX) injection  40 mg Subcutaneous Daily  . gabapentin  600 mg Oral QHS  . guaiFENesin  1,200 mg Oral BID  . losartan  25 mg Oral Daily  . metoCLOPramide  5 mg Oral TID AC  . mometasone-formoterol  2 puff Inhalation BID  . multivitamin  1 tablet Oral Daily  . pantoprazole  40 mg Oral BID AC  .  polyethylene glycol  17 g Oral Daily  . venlafaxine XR  75 mg Oral QHS   Continuous Infusions:   LOS: 1 day     Georgette Shell, MD [facility]  If 7PM-7AM, please contact night-coverage [email or web address] Password [facility] 03/24/2018, 1:43 PM

## 2018-03-24 NOTE — Progress Notes (Signed)
Pharmacy Antibiotic Note  Stacey [name] is a 77 y.o. female admitted on 03/23/2018 with pneumonia.  Pharmacy has been consulted for Vancomycin & Cefepime dosing. 03/24/2018:  Afebrile  Mild leukocytosis  PCT pending  Renal function at patient's baseline  Chest CT: improving infectious process  Plan: Cefepime 2gm IV q8h Vancomycin 1500mg  load followed by 1gm IV q24h (goal AUC 400-500) Monitor renal function and cx data  Consider check MRSA PCR and d/c Vancomycin if negative  Height: 5\' 6"  (167.6 cm) Weight: 140 lb 6.9 oz (63.7 kg) IBW/kg (Calculated) : 59.3  Temp (24hrs), Avg:97.9 F (36.6 C), Min:97.3 F (36.3 C), Max:98.3 F (36.8 C)  Recent Labs  Lab 03/21/18 1119 03/23/18 0731 03/23/18 1101 03/24/18 0508  WBC 10.7* 14.1* 13.8* 12.0*  CREATININE 0.87 0.67 0.76 0.60    Estimated Creatinine Clearance: 54.3 mL/min (by C-G formula based on SCr of 0.6 mg/dL).    Allergies  Allergen Reactions  . Penicillins Other (See Comments)    Reaction unknown occurred during childhood Has patient had a PCN reaction causing immediate rash, facial/tongue/throat swelling, SOB or lightheadedness with hypotension: Unknown Has patient had a PCN reaction causing severe rash involving mucus membranes or skin necrosis: Unknown Has patient had a PCN reaction that required hospitalization: No Has patient had a PCN reaction occurring within the last 10 years: No If all of the above answers are "NO", then may proceed with Cephalosporin use.  Unsur  . Remicade [Infliximab] Other (See Comments)    Reaction unknown  . Sulfa Antibiotics Other (See Comments)    Reaction unknown  . Sulfamethoxazole Other (See Comments)    Unsure of reaction As a child not sure     Antimicrobials this admission: 9/12 Vanc >>  9/12 Cefepime >>   Dose adjustments this admission:  Microbiology results: 9/12 Resp PCR MRSA PCR  Thank you for allowing pharmacy to be a part of this patient's  care.  Biagio Borg 03/24/2018 4:20 PM

## 2018-03-24 NOTE — Care Management Note (Signed)
Case Management Note  Patient Details  Name: Stacey [name] MRN: [id number] Date of Birth: 1941-05-23  Subjective/Objective: PT recc HHC-patient chose [facility] for HHPT rep Santiago Glad aware. On home 02-will monitor if needed @ home.                   Action/Plan:d/c home w/HHC.   Expected Discharge Date:                  Expected Discharge Plan:  [place]  In-House Referral:     Discharge planning Services  CM Consult  Post Acute Care Choice:  Durable Medical Equipment(rw) Choice offered to:  Patient  DME Arranged:    DME Agency:     HH Arranged:  PT [place]:  [place]  Status of Service:  In process, will continue to follow  If discussed at Long Length of Stay Meetings, dates discussed:    Additional Comments:  Dessa Phi, RN 03/24/2018, 2:20 PM

## 2018-03-24 NOTE — Progress Notes (Signed)
Initial Nutrition Assessment  DOCUMENTATION CODES:   Not applicable  INTERVENTION:  - Will order Ensure Enlive TID, each supplement provides 350 kcal and 20 grams of protein. - Continue to encourage PO intakes.    NUTRITION DIAGNOSIS:   Inadequate oral intake related to acute illness as evidenced by per patient/family report.  GOAL:   Patient will meet greater than or equal to 90% of their needs  MONITOR:   PO intake, Supplement acceptance, Weight trends, Labs  REASON FOR ASSESSMENT:   Consult Assessment of nutrition requirement/status  ASSESSMENT:   77 year old female with rheumatoid arthritis ILD, asthma, allergies and on prednisone for decades. She was seen by Pulmonologist on 03/21/18-noted respiration worse-mMRC 3 category on flat grade and was on Protonix in addition to reflux diet. At baseline she is ambulatory without oxygen or without assistive aid and usually she is not winded and is able to walk flat distances without too much distress. She presented to the ED with insomnia x2 days and ongoing SOB.   BMI indicates normal weight. No intakes documented since admission. Patient reports having some orange sherbet and bites of a chicken caesar salad for lunch. She states that at home she was mainly consuming soft foods as they were easier for her to tolerate although SOB would increase with intake of foods. She did much better with liquids and was drinking Ensure PTA. Patient became increasingly SOB and lethargic during brief interview so unable to obtain further information at this time; no family/visitors at bedside.   NFPE outlined below. Per chart review, current weight is 140 lb and patient weighed 135 lb at the time of this RD's note on 01/24/18. Unsure if false increase in weight d/t chronic prednisone use in addition to recent medication changes. Do not feel comfortable identifying malnutrition at this time, but patient is at very high risk.  Talked with her about good  options of soft foods and full liquids that she may tolerate ok.   Medications reviewed; 1000 units vitamin D/day, 60 mg Solu-medrol QID, 5 mg oral Reglan TID, 1 tablet Prosight/day, 40 mg oral Protonix BID, 1 packet Miralax/day. Labs reviewed; Ca: 8.6 mg/dL.      NUTRITION - FOCUSED PHYSICAL EXAM:    Most Recent Value  Orbital Region  No depletion  Upper Arm Region  No depletion  Thoracic and Lumbar Region  No depletion  Buccal Region  Mild depletion  Temple Region  Mild depletion  Clavicle Bone Region  No depletion  Clavicle and Acromion Bone Region  Mild depletion  Scapular Bone Region  No depletion  Dorsal Hand  No depletion  Patellar Region  No depletion  Anterior Thigh Region  No depletion  Posterior Calf Region  No depletion  Edema (RD Assessment)  None  Hair  Reviewed  Eyes  Reviewed  Mouth  Reviewed  Skin  Reviewed  Nails  Reviewed       Diet Order:   Diet Order            Diet regular Room service appropriate? Yes; Fluid consistency: Thin  Diet effective now              EDUCATION NEEDS:   Education needs have been addressed  Skin:  Skin Assessment: Reviewed RN Assessment  Last BM:  PTA/unknown  Height:   Ht Readings from Last 1 Encounters:  03/23/18 5\' 6"  (1.676 m)    Weight:   Wt Readings from Last 1 Encounters:  03/23/18 63.7 kg  Ideal Body Weight:  59.09 kg  BMI:  Body mass index is 22.67 kg/m.  Estimated Nutritional Needs:   Kcal:  2773-7505  Protein:  75-90 grams  Fluid:  >/= 1.6 L/day     Jarome Matin, MS, RD, LDN, [facility] Inpatient Clinical Dietitian Pager # [phone] After hours/weekend pager # [phone]

## 2018-03-24 NOTE — Progress Notes (Signed)
OT Cancellation Note  Patient Details Name: Stacey [name] MRN: [id number] DOB: [birth year]   Cancelled Treatment:    Reason Eval/Treat Not Completed: Medical issues which prohibited therapy. Spoke to Dr Rodena Piety: pt not ready for OT today. Will check back tomorrow  Ieisha Gao 03/24/2018, 8:01 AM  Lesle [name], OTR/L Acute Rehabilitation Services [phone] [facility] pager [phone] office 03/24/2018

## 2018-03-25 DIAGNOSIS — I10 Essential (primary) hypertension: Secondary | ICD-10-CM

## 2018-03-25 LAB — GLUCOSE, CAPILLARY: Glucose-Capillary: 123 mg/dL — ABNORMAL HIGH (ref 70–99)

## 2018-03-25 LAB — PROCALCITONIN: Procalcitonin: <0.1 ng/mL

## 2018-03-25 LAB — STREP PNEUMONIAE URINARY ANTIGEN: Strep Pneumo Urinary Antigen: NEGATIVE

## 2018-03-25 LAB — MRSA PCR SCREENING: MRSA by PCR: POSITIVE — AB

## 2018-03-25 MED ORDER — DOXYCYCLINE HYCLATE 100 MG PO TABS
100.0000 mg | ORAL_TABLET | Freq: Two times a day (BID) | ORAL | Status: DC
  Administered 2018-03-25 – 2018-03-29 (×8): 100 mg via ORAL
  Filled 2018-03-25 (×8): qty 1

## 2018-03-25 MED ORDER — IPRATROPIUM-ALBUTEROL 0.5-2.5 (3) MG/3ML IN SOLN
3.0000 mL | Freq: Three times a day (TID) | RESPIRATORY_TRACT | Status: DC
  Administered 2018-03-25: 3 mL via RESPIRATORY_TRACT
  Filled 2018-03-25: qty 3

## 2018-03-25 MED ORDER — LORAZEPAM 0.5 MG PO TABS
0.5000 mg | ORAL_TABLET | Freq: Two times a day (BID) | ORAL | Status: DC | PRN
  Administered 2018-03-26 – 2018-03-28 (×3): 0.5 mg via ORAL
  Filled 2018-03-25 (×3): qty 1

## 2018-03-25 MED ORDER — CHLORHEXIDINE GLUCONATE CLOTH 2 % EX PADS
6.0000 | MEDICATED_PAD | Freq: Every day | CUTANEOUS | Status: DC
  Administered 2018-03-26 – 2018-03-29 (×3): 6 via TOPICAL

## 2018-03-25 MED ORDER — SODIUM CHLORIDE 0.9 % IV SOLN
2.0000 g | Freq: Two times a day (BID) | INTRAVENOUS | Status: DC
  Administered 2018-03-25: 2 g via INTRAVENOUS
  Filled 2018-03-25 (×2): qty 2

## 2018-03-25 MED ORDER — IPRATROPIUM BROMIDE 0.02 % IN SOLN
0.5000 mg | RESPIRATORY_TRACT | Status: DC
  Administered 2018-03-25 – 2018-03-27 (×8): 0.5 mg via RESPIRATORY_TRACT
  Filled 2018-03-25 (×8): qty 2.5

## 2018-03-25 MED ORDER — MUPIROCIN 2 % EX OINT
1.0000 "application " | TOPICAL_OINTMENT | Freq: Two times a day (BID) | CUTANEOUS | Status: DC
  Administered 2018-03-25 – 2018-03-29 (×9): 1 via NASAL
  Filled 2018-03-25: qty 22

## 2018-03-25 MED ORDER — IPRATROPIUM-ALBUTEROL 0.5-2.5 (3) MG/3ML IN SOLN: 3.0000 mL | RESPIRATORY_TRACT | Status: DC

## 2018-03-25 MED ORDER — LEVALBUTEROL HCL 1.25 MG/0.5ML IN NEBU
1.2500 mg | INHALATION_SOLUTION | RESPIRATORY_TRACT | Status: DC
  Administered 2018-03-25 – 2018-03-27 (×8): 1.25 mg via RESPIRATORY_TRACT
  Filled 2018-03-25 (×8): qty 0.5

## 2018-03-25 MED ORDER — HYDROCOD POLST-CPM POLST ER 10-8 MG/5ML PO SUER
5.0000 mL | Freq: Two times a day (BID) | ORAL | Status: DC
  Administered 2018-03-25 – 2018-03-29 (×9): 5 mL via ORAL
  Filled 2018-03-25 (×9): qty 5

## 2018-03-25 NOTE — Consult Note (Addendum)
MATRICE HERRO  GUY:[id number] DOB: September 20, 1940 DOA: 03/23/2018 PCP: Shon Baton, MD    LOS: 2 days   Reason for Consult / Chief Complaint:  Acute Hypoxic Respiratory Failure  Consulting MD and date:  Dr. 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 Eda Paschal) and low-dose Medrol (8 mg), interstitial lung disease, GERD, MAI/BOOD (2001) and suspected asthma who presented to [facility] on 9/11 with reports of increased cough, decreased activity tolerance and shortness of breath.  At baseline the patient lives at home with her husband and is independent of ADLs.  She does report that she was admitted in July for a similar episode with increased cough and her activity tolerance has been reduced since that admission.  She reports intermittent coughing with " spells lasting up to 45 minutes at a time".  She denies fevers, chills, sputum production or other infectious prodrome.  She does report that she has been off her Protonix in the last few weeks.  Denies sinus drainage/postnasal drip.  She denies seasonal allergies and difficulty with swallowing/aspiration.    During her hospitalization in July, she was seen by speech therapy and recommended for regular diet with thin liquids.  However, the patient has been using a soft consistency and liquid diet due to her preference/comfort.  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  Additionally she was noted to have wheezing during that admission which was thought possibly related to RA related arytenoid dysfunction.  There were recommendations for the patient to follow-up with ENT for evaluation at that time it does not appear that she has been seen.  She has been  treated with PPI and H2 blocker for acid reflux & gabapentin for cough.  Chart review shows that her primary rheumatologist (last visit 02/23/2018) was considering changing her from the Orencia to Rituxan infusion.    During current hospitalization she has been ruled out for pulmonary embolism with CTA.  This did show unchanged right upper lobe airspace disease/scarring, mild emphysematous disease/air trapping.  No acute infectious process noted.  Patient is being empirically treated for pulmonary infection with vancomycin and cefepime.  She has required 2 to 4 L oxygen during this admission which is new for the patient.  PCCM consulted for evaluation 9/13 with new hypoxemic respiratory failure.  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    Subjective:  Patient reports ongoing dry cough and anxiety surrounding cough and shortness of breath.  Objective   Blood pressure 136/68, pulse (!) 112, temperature 97.9 F (36.6 C), temperature source Oral, resp. rate (!) 22, height 5\' 6"  (1.676 m), weight 63.7 kg, SpO2 91 %.        Intake/Output Summary (Last 24 hours) at 03/25/2018 1241 Last data filed at 03/25/2018 0600 Gross per 24 hour  Intake 676.49 ml  Output -  Net 676.49 ml   Filed Weights   03/23/18 0708 03/23/18 1050  Weight: 63.2 kg 63.7 kg    Examination: General: Thin elderly female in no acute distress sitting up in bed HENT: Mucous membranes pink/moist, nasal cannula O2, good dentition Lungs: Even/unlabored on 2 L nasal cannula, lungs bilaterally without  wheeze, good air entry Cardiovascular: S1-S2 RRR, no M/R/G Abdomen: Flat, soft, nontender, BS x4 active Extremities: Warm/dry Neuro: AAO x4, M AE, normal strength GU: Deferred  Consults: date of consult/date signed off & final recs:     Procedures: HRCT 11/06/2016 >> patchy multifocal areas of peripheral predominant groundglass attenuation and  septal thickening, scattered areas of peripheral bronchiolectasis, mild cylindrical bronchiectasis, inspiratory and expiratory imaging consistent with moderate air-trapping indicated small airways disease.  Echo 12/08/2017 >> LVEF 55%, trivial TR, trivial MR  PFTs 01/10/18 - FVC 1.99 (67%), FEV1 1.55 (70%), Ratio 78, DLCOunc 16.09 (59%)   Significant Diagnostic Tests: CTA chest 9/12 >> no acute pulmonary embolus, chronic interstitial changes in the lungs, slight improvement in the right lower lobe airspace filling opacities  Micro Data: RVP 9/12 >> negative  Antimicrobials:  Cefepime 9/12 >> Vancomycin 9/12 >>  Resolved Hospital Problem list    Assessment & Plan:   Acute hypoxemic respiratory failure-suspect in setting of underlying RA related lung disease with concern for progression with mild increase in RLL changes / change in PFT's, emphysematous changes.  PE ruled out.  CT images do not appear consistent with ILD flare.  RUL changes on imaging noted back to April 2019.  Echo in 5/19 without CHF physiology.   P: O2 as needed for saturations greater than 90% Pulmonary hygiene Follow chest x-ray Will need ambulatory O2 assessment prior to discharge   Cough in the setting of RA related ILD -question if underlying ILD is the culprit for cough vs contribution of Orencia to pulmonary inflammation/COPD-like symptoms. P: Continue Solu-Medrol Mucinex Continue gabapentin Tussionex for cough Will need follow-up at [facility] with rheumatology for consideration of RA therapy Consider opportunistic infection, ? FOB > defer to MD  Anxiety P: Trial low-dose p.o. Ativan > notes she reports drowsiness after dosing for CT imaging with 0.5 mg IV Monitor for sedation closely   Disposition / Summary of Today's Plan 03/25/18   Continue empiric antibiotics, high-dose steroids and pulmonary hygiene.    DVT prophylaxis: Enoxaparin GI prophylaxis: Pepcid, Protonix Diet: Diet as  tolerated Mobility: As tolerated Code Status: Full code Family Communication: Family updated at bedside 9/13 on NP rounds.  Labs   CBC: Recent Labs  Lab 03/21/18 1119 03/23/18 0731 03/23/18 1101 03/24/18 0508  WBC 10.7* 14.1* 13.8* 12.0*  NEUTROABS 6.3 5.6  --  6.4  HGB 12.5 12.5 12.5 11.5*  HCT 37.4 39.0 39.6 36.1  MCV 89.9 94.4 95.0 94.0  PLT 358.0 375 367 595   Basic Metabolic Panel: Recent Labs  Lab 03/21/18 1119 03/23/18 0731 03/23/18 1101 03/24/18 0508  NA 137 141  --  139  K 4.3 4.2  --  4.2  CL 99 101  --  100  CO2 32 29  --  32  GLUCOSE 102* 112*  --  91  BUN 14 11  --  12  CREATININE 0.87 0.67 0.76 0.60  CALCIUM 9.0 8.8*  --  8.6*   GFR: Estimated Creatinine Clearance: 54.3 mL/min (by C-G formula based on SCr of 0.6 mg/dL). Recent Labs  Lab 03/21/18 1119 03/23/18 0731 03/23/18 1101 03/24/18 0508 03/24/18 1613 03/25/18 0448  PROCALCITON  --   --   --   --  <0.10 <0.10  WBC 10.7* 14.1* 13.8* 12.0*  --   --    Liver Function Tests: Recent Labs  Lab 03/24/18 0508  AST 18  ALT 14  ALKPHOS 50  BILITOT 0.4  PROT 6.0*  ALBUMIN 3.0*  No results for input(s): LIPASE, AMYLASE in the last 168 hours. No results for input(s): AMMONIA in the last 168 hours. ABG    Component Value Date/Time   TCO2 31 12/18/2009 0347    Coagulation Profile: No results for input(s): INR, PROTIME in the last 168 hours.   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   Surgical History    Past Surgical History:  Procedure Laterality Date  . CHOLECYSTECTOMY  2011  . WISDOM TOOTH EXTRACTION       Social History   Social History   Socioeconomic History  . Marital status: Married    Spouse name: Not on file  . Number of children: 2  . Years of education: BA  . Highest education level: Not on file  Occupational History  . Occupation: Retired  Scientific laboratory technician  . Financial resource strain: Not on file  . Food insecurity:    Worry: Not on file    Inability: Not on file  . Transportation needs:    Medical: Not on file    Non-medical: Not on file  Tobacco Use  . Smoking status: Never Smoker  . Smokeless tobacco: Never Used  Substance and Sexual Activity  . Alcohol use: No  . Drug use: No  . Sexual activity: Yes  Lifestyle  . Physical activity:    Days per week: Not on file    Minutes per session: Not on file  . Stress: Not on file  Relationships  . Social connections:    Talks on phone: Not on file    Gets together: Not on file    Attends religious service: Not on file    Active member of club or organization: Not on file    Attends meetings of clubs or organizations: Not on file    Relationship status: Not on file  . Intimate partner violence:    Fear of current or ex partner: Not on file    Emotionally abused: Not on file    Physically abused: Not on file    Forced sexual activity: Not on file  Other Topics Concern  . Not on file  Social History Narrative   Drinks 1 cup of coffee and 1 diet coke a day   ,  reports that she has never smoked. She has never used smokeless tobacco. She reports that she does not drink alcohol or use drugs.   Family history   Her family history includes Anemia in her mother; Asthma in her  mother; Breast cancer (age of onset: 28) in her maternal grandmother; COPD in her father; Pulmonary fibrosis in her father.   Allergies Allergies  Allergen Reactions  . Penicillins Other (See Comments)    Reaction unknown occurred during childhood Has patient had a PCN reaction causing immediate rash, facial/tongue/throat swelling, SOB or lightheadedness with hypotension: Unknown Has patient had a PCN reaction causing severe rash involving mucus membranes or skin necrosis: Unknown Has patient had a PCN reaction that required hospitalization: No Has patient had a PCN reaction occurring within the last 10 years: No If all of the above answers are "NO", then may proceed with Cephalosporin use.  Unsur  . Remicade [Infliximab] Other (See Comments)    Reaction unknown  . Sulfa Antibiotics Other (See Comments)    Reaction unknown  . Sulfamethoxazole Other (See Comments)    Unsure of reaction As a child not sure     Home meds  Prior to Admission medications   Medication Sig Start Date End Date Taking? Authorizing Provider  abatacept (ORENCIA) 250 MG injection Inject 250 mg into the vein every 30 (thirty) days.    Yes [provider]  acetaminophen-codeine (TYLENOL #3) 300-30 MG tablet Take 1 tablet by mouth every 4 (four) hours as needed for up to 5 days (cough). 03/21/18 03/26/18 Yes Tanda Rockers, MD  albuterol (PROVENTIL) (2.5 MG/3ML) 0.083% nebulizer solution Inhale 2.5 mg into the lungs every 4 (four) hours as needed for wheezing or shortness of breath.    Yes [provider]  amitriptyline (ELAVIL) 25 MG tablet Take 25 mg by mouth at bedtime. 02/04/15  Yes [provider]  beta carotene w/minerals (OCUVITE) tablet Take 1 tablet by mouth daily.   Yes [provider]  cholecalciferol (VITAMIN D) 1000 UNITS tablet Take 1,000 Units by mouth daily.   Yes [provider]  dextromethorphan (DELSYM) 30 MG/5ML liquid Take 2.5 mLs (15 mg total) by mouth  at bedtime. 01/27/18  Yes Lavina Hamman, MD  gabapentin (NEURONTIN) 300 MG capsule Take 600 mg by mouth at bedtime.   Yes [provider]  guaiFENesin (MUCINEX) 600 MG 12 hr tablet Take 600 mg by mouth every 12 (twelve) hours as needed for cough or to loosen phlegm.  01/27/18  Yes [provider]  ibuprofen (ADVIL,MOTRIN) 600 MG tablet Take 600 mg by mouth every 6 (six) hours as needed (For cough.).    Yes [provider]  levalbuterol Penne Lash) 0.63 MG/3ML nebulizer solution Inhale 0.63 mg into the lungs 2 (two) times daily as needed for wheezing or shortness of breath.  01/27/18  Yes [provider]  losartan (COZAAR) 25 MG tablet Take 25 mg by mouth daily. 02/04/15  Yes [provider]  methylPREDNISolone (MEDROL) 8 MG tablet Take 2 mg by mouth at bedtime.    Yes [provider]  metoCLOPramide (REGLAN) 5 MG tablet Take 1 tablet (5 mg total) by mouth 3 (three) times daily before meals. 03/21/18  Yes Tanda Rockers, MD  pantoprazole (PROTONIX) 40 MG tablet Take 1 tablet (40 mg total) by mouth 2 (two) times daily before a meal. Patient taking differently: Take 40 mg by mouth 2 (two) times daily as needed (For heartburn or acid reflux.).  01/27/18  Yes Lavina Hamman, MD  polyethylene glycol [facility] / GLYCOLAX) packet Take 17 g by mouth daily. Patient taking differently: Take 17 g by mouth daily as needed (For constipation.).  01/27/18  Yes Lavina Hamman, MD  traMADol (ULTRAM) 50 MG tablet Take 50 mg by mouth every 6 (six) hours as needed (For pain.).    Yes [provider]  venlafaxine XR (EFFEXOR-XR) 75 MG 24 hr capsule Take 75 mg by mouth at bedtime.    Yes [provider]      Noe Gens, NP-C [facility] Pgr: [phone] or if no answer [phone] 03/25/2018, 1:20 PM

## 2018-03-25 NOTE — Evaluation (Signed)
Occupational Therapy Evaluation Patient Details Name: Stacey [name] MRN: [id number] DOB: [birth year] Today's Date: 03/25/2018    History of Present Illness Pt is a 77 year old female with history of chronic lung disease/interstitial lung disease, rheumatoid arthritis and bronchitis and admitted for acute hypoxic respiratory failure secondary to asthma exacerbation   Clinical Impression   Pt was admitted for the above. At baseline, she is independent. Pt's activity tolerance is limited at this time due to fatique and increased WOB.  She will benefit from continued OT to increase safety and independence with adls.  Goals in acute are for supervision level    Follow Up Recommendations  Home health OT;Supervision/Assistance - 24 hour    Equipment Recommendations  3 in 1 bedside commode    Recommendations for Other Services       Precautions / Restrictions Precautions Precautions: Fall Precaution Comments: monitor sats Restrictions Weight Bearing Restrictions: No      Mobility Bed Mobility Overal bed mobility: Needs Assistance Bed Mobility: Supine to Sit;Sit to Supine     Supine to sit: Supervision Sit to supine: Supervision   General bed mobility comments: increased time. supervision for safety/lines  Transfers Overall transfer level: Needs assistance Equipment used: None   Sit to Stand: Min guard Stand pivot transfers: Min guard       General transfer comment: pt held to armrests of BSC for transfer    Balance                                           ADL either performed or assessed with clinical judgement   ADL Overall ADL's : Needs assistance/impaired Eating/Feeding: Independent;Sitting   Grooming: Set up;Sitting   Upper Body Bathing: Minimal assistance;Sitting   Lower Body Bathing: Maximal assistance;Sit to/from stand   Upper Body Dressing : Minimal assistance;Sitting   Lower Body Dressing: Maximal assistance;Sit to/from  stand   Toilet Transfer: Minimal assistance;Stand-pivot;BSC;RW   Toileting- Water quality scientist and Hygiene: Min guard;Minimal assistance;Sit to/from stand(assistance to manage 2 gowns)         General ADL Comments: pt limited with adls due to increased WOB. She is not able to cross legs for adls. Educated on energy conservation; will bring handout on next visit     Vision         Perception     Praxis      Pertinent Vitals/Pain Pain Assessment: No/denies pain     Hand Dominance     Extremity/Trunk Assessment Upper Extremity Assessment Upper Extremity Assessment: Generalized weakness           Communication Communication Communication: No difficulties   Cognition Arousal/Alertness: Awake/alert Behavior During Therapy: WFL for tasks assessed/performed Overall Cognitive Status: Within Functional Limits for tasks assessed                                     General Comments  WOB increased; sats 92-93% on 2 liters    Exercises     Shoulder Instructions      Home Living Family/patient expects to be discharged to:: Private residence Living Arrangements: Spouse/significant other   Type of Home: House       Home Layout: Able to live on main level with bedroom/bathroom  Home Equipment: None          Prior Functioning/Environment Level of Independence: Independent                 OT Problem List: Decreased strength;Decreased activity tolerance;Decreased knowledge of use of DME or AE      OT Treatment/Interventions: Self-care/ADL training;Energy conservation;DME and/or AE instruction;Patient/family education;Balance training    OT Goals(Current goals can be found in the care plan section) Acute Rehab OT Goals Patient Stated Goal: return to independence OT Goal Formulation: With patient Time For Goal Achievement: 04/08/18 Potential to Achieve Goals: Good ADL Goals Pt Will Transfer to Toilet: with  supervision;ambulating;bedside commode Additional ADL Goal #1: pt will perform adl with AE with supervision and initiate at least two rest breaks for energy conservation  OT Frequency: Min 2X/week   Barriers to D/C:            Co-evaluation PT/OT/SLP Co-Evaluation/Treatment: Yes Reason for Co-Treatment: For patient/therapist safety PT goals addressed during session: Mobility/safety with mobility OT goals addressed during session: ADL's and self-care      AM-PAC PT "6 Clicks" Daily Activity     Outcome Measure Help from another person eating meals?: None Help from another person taking care of personal grooming?: A Little Help from another person toileting, which includes using toliet, bedpan, or urinal?: A Little Help from another person bathing (including washing, rinsing, drying)?: A Lot Help from another person to put on and taking off regular upper body clothing?: A Little Help from another person to put on and taking off regular lower body clothing?: A Lot 6 Click Score: 17   End of Session    Activity Tolerance: Patient limited by fatigue Patient left: in bed;with call bell/phone within reach  OT Visit Diagnosis: Muscle weakness (generalized) (M62.81)                Time: 0177-9390 OT Time Calculation (min): 22 min Charges:  OT General Charges $OT Visit: 1 Visit OT Evaluation $OT Eval Low Complexity: [employer], OTR/L Acute Rehabilitation Services [phone] [facility] pager [phone] office 03/25/2018  [place] 03/25/2018, 12:41 PM

## 2018-03-25 NOTE — Progress Notes (Signed)
Pharmacy Antibiotic Note  Stacey [name] is a 77 y.o. female admitted on 03/23/2018 with pneumonia.  Pharmacy has been consulted for Vancomycin & Cefepime dosing.  Patient has PCN allergy listed on chart as a childhood reaction but has tolerated 2 doses of cefepime thus far.  Today,03/25/18  Afebrile  WBC 12 on 9/12. No new labs this morning. Pt also on IV steroids  PCT <0.10  Renal function at patient's baseline, CrCl ~54 mL/min  Chest CT: improving infectious process  Plan:  Decrease cefepime to 2 g IV q12h  Continue vancomycin 1500mg  load followed by 1 gm IV q24h (goal AUC 400-500)  Monitor renal function and cx data   Recommend checking MRSA PCR and consider discontinuing vancomycin if PCR negative  Height: 5\' 6"  (167.6 cm) Weight: 140 lb 6.9 oz (63.7 kg) IBW/kg (Calculated) : 59.3  Temp (24hrs), Avg:98 F (36.7 C), Min:97.9 F (36.6 C), Max:98 F (36.7 C)  Recent Labs  Lab 03/21/18 1119 03/23/18 0731 03/23/18 1101 03/24/18 0508  WBC 10.7* 14.1* 13.8* 12.0*  CREATININE 0.87 0.67 0.76 0.60    Estimated Creatinine Clearance: 54.3 mL/min (by C-G formula based on SCr of 0.6 mg/dL).    Allergies  Allergen Reactions  . Penicillins Other (See Comments)    Reaction unknown occurred during childhood Has patient had a PCN reaction causing immediate rash, facial/tongue/throat swelling, SOB or lightheadedness with hypotension: Unknown Has patient had a PCN reaction causing severe rash involving mucus membranes or skin necrosis: Unknown Has patient had a PCN reaction that required hospitalization: No Has patient had a PCN reaction occurring within the last 10 years: No If all of the above answers are "NO", then may proceed with Cephalosporin use.  Unsur  . Remicade [Infliximab] Other (See Comments)    Reaction unknown  . Sulfa Antibiotics Other (See Comments)    Reaction unknown  . Sulfamethoxazole Other (See Comments)    Unsure of reaction As a child not  sure     Antimicrobials this admission: 9/12 Vanc >>  9/12 Cefepime >>   Dose adjustments this admission: 9/13 cefepime 2 g IV q8h --> cefepime 2 g IV q12h  Microbiology results: 9/12 Respiratory panel: negative 9/12 Influenza: negative  Thank you for allowing pharmacy to be a part of this patient's care.  Lenis Noon, PharmD 03/25/2018 8:09 AM

## 2018-03-25 NOTE — Progress Notes (Signed)
PROGRESS NOTE    Stacey [name]  WER:[id number] DOB: [birth year] DOA: 03/23/2018 PCP: Shon Baton, MD Brief Narrative: 77 year old female with rheumatoid arthritis ILD, asthma diagnosed 2000 treated currently at [facility] by rheumatologist Asthma diagnosed with allergies as a child-has been on prednisone for decades when previously evaluated by Dr. Noel Christmas on [place] 2014 September for rheumatoid issues  Recently seen at pulmonology office 03/21/2018-noted respiration worse-mMRC 3 category on flat grade and was on Protonix in addition to reflux diet  Recently admitted to hospital 01/2018 with dyspnea hypoxemia-at the time echo EF 55-60% with grade 1 diastolic dysfunction-Esophagramwas done and patient was placed by pulmonary on Reglan as OP  Hus[place] that [place] current issues [place]-on going to [facility] on 9/4 was difficult for the patient-at baseline she is ambulatory without oxygen or without assistive aid except when she was discharged back in [place] she is not winded and is able to walk flat distances without too much distress   Returns to ED today with further issues--insomnia X 2 days since being seen at pulmonology office visit-was prescribed 8 mg prednisone at that visit, Hycodan was changed to Tylenol 3 to help with work of breathing-another transitional care management appointment was made for the patient to determine her polypharmacy and the need for the same  ED Course:Patient given continued patient given continuous neb in the emergency room and given magnesium-when I went down to see her she was off oxygen and when she ambulated for 5 steps she felt fearful and desatted to the high 80s with increased work of breathing 30 times per minute which resolved after about 5 minutes of deep pursed lip breathing   Glucose 112, WBC 14, lymphocytosis 4.6 BNP 89.4, troponin 0 0.03, 1 view chest x-ray  bibasilar atelectasis no edema consolidation, EKG showed sinus rhythm with artifact PR interval 0.08 QRS axis about 30 degrees no ischemic injury  Assessment & Plan:   Active Problems:   Asthma exacerbation   Essential hypertension   Depression   1]  Hypoxic respiratory failure with interstitial lung disease and rheumatoid lung and asthma -she admitted with complaints of dyspnea on exertion worsening shortness of breath and wheezing.  Continue IV steroids and nebulizer treatments.  Will DC vancomycin and cefepime and started on doxycycline since MRSA PCR positive.  CT of the chest showed no acute PE, chronic interstitial changes in the lungs, slight improvement in the right lower lobe airspace filling opacities consistent with improving infectious process. Continue Protonix for GERD.  She reports that she did not know she had GERD.  Chest x-ray 911 no acute findings.  Patient is on 2 L of oxygen at this time she does not have oxygen prior to admission to the hospital.  Appreciate PCCM input.  2]HTN continue antihypertensives like she was taking at home blood pressure is up and down.  Hydralazine PRN.  3] depression/bipolar disorder Home medications.    DVT prophylaxis lovenox Code Status:full Family Communication: Discussed with husband and son Disposition Plan: TBD  Consultants:    Procedures: None Antimicrobials doxycycline Subjective: She feels she is not better.  But she has been talking in full sentences improved wheezing after IV steroids antibiotics and nebulizers.  Dependent on oxygen at 2 L.  Hypoxic on ambulation.  Objective: Vitals:   03/24/18 2057 03/25/18 0607 03/25/18 0748 03/25/18 1441  BP: 131/72 136/68    Pulse: 87 82 (!) 112 92  Resp: 16 16 (!)  22 18  Temp: 97.9 F (36.6 C)     TempSrc: Oral     SpO2: 95% 95% 91% 90%  Weight:      Height:        Intake/Output Summary (Last 24 hours) at 03/25/2018 1706 Last data filed at 03/25/2018 0600 Gross per 24  hour  Intake 676.49 ml  Output -  Net 676.49 ml   Filed Weights   03/23/18 0708 03/23/18 1050  Weight: 63.2 kg 63.7 kg    Examination:  General exam: Appears calm and comfortable  Respiratory system: wheezing decreased  auscultation. Respiratory effort normal. Cardiovascular system: S1 & S2 heard, RRR. No JVD, murmurs, rubs, gallops or clicks. No pedal edema. Gastrointestinal system: Abdomen is nondistended, soft and nontender. No organomegaly or masses felt. Normal bowel sounds heard. Central nervous system: Alert and oriented. No focal neurological deficits. Extremities: Symmetric 5 x 5 power. Skin: No rashes, lesions or ulcers Psychiatry: Judgement and insight appear normal. Mood & affect appropriate.     Data Reviewed: I have personally reviewed following labs and imaging studies  CBC: Recent Labs  Lab 03/21/18 1119 03/23/18 0731 03/23/18 1101 03/24/18 0508  WBC 10.7* 14.1* 13.8* 12.0*  NEUTROABS 6.3 5.6  --  6.4  HGB 12.5 12.5 12.5 11.5*  HCT 37.4 39.0 39.6 36.1  MCV 89.9 94.4 95.0 94.0  PLT 358.0 375 367 121   Basic Metabolic Panel: Recent Labs  Lab 03/21/18 1119 03/23/18 0731 03/23/18 1101 03/24/18 0508  NA 137 141  --  139  K 4.3 4.2  --  4.2  CL 99 101  --  100  CO2 32 29  --  32  GLUCOSE 102* 112*  --  91  BUN 14 11  --  12  CREATININE 0.87 0.67 0.76 0.60  CALCIUM 9.0 8.8*  --  8.6*   GFR: Estimated Creatinine Clearance: 54.3 mL/min (by C-G formula based on SCr of 0.6 mg/dL). Liver Function Tests: Recent Labs  Lab 03/24/18 0508  AST 18  ALT 14  ALKPHOS 50  BILITOT 0.4  PROT 6.0*  ALBUMIN 3.0*   No results for input(s): LIPASE, AMYLASE in the last 168 hours. No results for input(s): AMMONIA in the last 168 hours. Coagulation Profile: No results for input(s): INR, PROTIME in the last 168 hours. Cardiac Enzymes: Recent Labs  Lab 03/23/18 0731  TROPONINI <0.03   BNP (last 3 results) Recent Labs    03/21/18 1119  PROBNP 38.0    HbA1C: No results for input(s): HGBA1C in the last 72 hours. CBG: Recent Labs  Lab 03/24/18 0739 03/25/18 0757  GLUCAP 82 123*   Lipid Profile: No results for input(s): CHOL, HDL, LDLCALC, TRIG, CHOLHDL, LDLDIRECT in the last 72 hours. Thyroid Function Tests: No results for input(s): TSH, T4TOTAL, FREET4, T3FREE, THYROIDAB in the last 72 hours. Anemia Panel: No results for input(s): VITAMINB12, FOLATE, FERRITIN, TIBC, IRON, RETICCTPCT in the last 72 hours. Sepsis Labs: Recent Labs  Lab 03/24/18 1613 03/25/18 0448  PROCALCITON <0.10 <0.10    Recent Results (from the past 240 hour(s))  Respiratory Panel by PCR     Status: None   Collection Time: 03/24/18  4:02 PM  Result Value Ref Range Status   Adenovirus NOT DETECTED NOT DETECTED Final   Coronavirus 229E NOT DETECTED NOT DETECTED Final   Coronavirus HKU1 NOT DETECTED NOT DETECTED Final   Coronavirus NL63 NOT DETECTED NOT DETECTED Final   Coronavirus OC43 NOT DETECTED NOT DETECTED Final   Metapneumovirus  NOT DETECTED NOT DETECTED Final   Rhinovirus / Enterovirus NOT DETECTED NOT DETECTED Final   Influenza A NOT DETECTED NOT DETECTED Final   Influenza B NOT DETECTED NOT DETECTED Final   Parainfluenza Virus 1 NOT DETECTED NOT DETECTED Final   Parainfluenza Virus 2 NOT DETECTED NOT DETECTED Final   Parainfluenza Virus 3 NOT DETECTED NOT DETECTED Final   Parainfluenza Virus 4 NOT DETECTED NOT DETECTED Final   Respiratory Syncytial Virus NOT DETECTED NOT DETECTED Final   Bordetella pertussis NOT DETECTED NOT DETECTED Final   Chlamydophila pneumoniae NOT DETECTED NOT DETECTED Final   Mycoplasma pneumoniae NOT DETECTED NOT DETECTED Final    Comment: Performed at [facility], [place] [address] [place], [place] [address]  MRSA PCR Screening     Status: Abnormal   Collection Time: 03/25/18  9:49 AM  Result Value Ref Range Status   MRSA by PCR POSITIVE (A) NEGATIVE Final    Comment:        The GeneXpert MRSA Assay  (FDA approved for NASAL specimens only), is one component of a comprehensive MRSA colonization surveillance program. It is not intended to diagnose MRSA infection nor to guide or monitor treatment for MRSA infections. RESULT CALLED TO, READ BACK BY AND VERIFIED WITH: Livengood [id number] @ [place] Performed at [facility], [place] [address], [place], [place] [address]          Radiology Studies: Ct Angio Chest Pe W Or Wo Contrast  Result Date: 03/24/2018 CLINICAL DATA:  History of interstitial lung disease, asthma. Respirations are worse. Hypoxia. EXAM: CT ANGIOGRAPHY CHEST WITH CONTRAST TECHNIQUE: Multidetector CT imaging of the chest was performed using the standard protocol during bolus administration of intravenous contrast. Multiplanar CT image reconstructions and MIPs were obtained to evaluate the vascular anatomy. CONTRAST:  153mL ISOVUE-370 IOPAMIDOL (ISOVUE-370) INJECTION 76% COMPARISON:  Chest x-ray 03/23/2018 FINDINGS: Cardiovascular: Coronary atherosclerosis. There is atherosclerotic calcification of the thoracic aorta not associated with aneurysm. The pulmonary arteries are well opacified by contrast bolus. No acute pulmonary embolus. Mediastinum/Nodes: Small hiatal hernia. No mediastinal, hilar, or axillary adenopathy. Lungs/Pleura: Study quality is degraded by patient motion artifact. There is biapical pleuroparenchymal scarring. Subpleural reticular changes and septal thickening are identified throughout the lungs. Ground-glass opacities in the RIGHT LOWER lobe have improved slightly, consistent with resolving inflammatory/infectious process. No suspicious pulmonary nodules. No pleural effusions or consolidations. No pulmonary edema. Upper Abdomen: Cholecystectomy. Musculoskeletal: Superior endplate fractures of T5, T6, and T10 are favored to be chronic. Review of the MIP images confirms the above findings. IMPRESSION: 1. Technically adequate exam  showing no acute pulmonary embolus. 2. Chronic interstitial changes in the lungs. 3. Slight improvement in RIGHT LOWER lobe airspace filling opacities, consistent with improving infectious process. 4.  Aortic atherosclerosis.  (ICD10-I70.0) Electronically Signed   By: Nolon Nations M.D.   On: 03/24/2018 15:40        Scheduled Meds: . amitriptyline  25 mg Oral QHS  . [START ON 03/26/2018] Chlorhexidine Gluconate Cloth  6 each Topical Q0600  . chlorpheniramine-HYDROcodone  5 mL Oral Q12H  . cholecalciferol  1,000 Units Oral Daily  . enoxaparin (LOVENOX) injection  40 mg Subcutaneous Daily  . feeding supplement (ENSURE ENLIVE)  237 mL Oral TID BM  . gabapentin  600 mg Oral QHS  . guaiFENesin  1,200 mg Oral BID  . ipratropium  0.5 mg Nebulization Q4H  . levalbuterol  1.25 mg Nebulization Q4H  . losartan  25  mg Oral Daily  . methylPREDNISolone (SOLU-MEDROL) injection  60 mg Intravenous Q6H  . metoCLOPramide  5 mg Oral TID AC  . multivitamin  1 tablet Oral Daily  . mupirocin ointment  1 application Nasal BID  . pantoprazole  40 mg Oral BID AC  . polyethylene glycol  17 g Oral Daily  . venlafaxine XR  75 mg Oral QHS   Continuous Infusions: . sodium chloride 250 mL (03/24/18 1801)     LOS: 2 days     Georgette Shell, MD [facility]  If 7PM-7AM, please contact night-coverage [email or web address] Password [facility] 03/25/2018, 5:06 PM

## 2018-03-25 NOTE — Progress Notes (Signed)
CRITICAL VALUE ALERT  Critical Value:  Positive MRSA PCR  Date & Time Notied:  9/13 1300  Provider Notified:   Orders Received/Actions taken:

## 2018-03-25 NOTE — Progress Notes (Signed)
Physical Therapy Treatment Patient Details Name: Stacey [name] MRN: [id number] DOB: Sep 20, 1940 Today's Date: 03/25/2018    History of Present Illness Pt is a 77 year old female with history of chronic lung disease/interstitial lung disease, rheumatoid arthritis and bronchitis and admitted for acute hypoxic respiratory failure secondary to asthma exacerbation    PT Comments    Pt is progressing slowly. She was able to walk ~25 feet x 2 with use of a rollator on today. She required a seated rest break. O2 sat 92% on 3L [place] O2. Dyspnea 3/4. Will continue to progress activity as tolerated.   Follow Up Recommendations  Home health PT;Supervision for mobility/OOB     Equipment Recommendations  Rolling walker with 5" wheels    Recommendations for Other Services       Precautions / Restrictions Precautions Precautions: Fall Precaution Comments: monitor sats Restrictions Weight Bearing Restrictions: No    Mobility  Bed Mobility Overal bed mobility: Needs Assistance Bed Mobility: Supine to Sit;Sit to Supine     Supine to sit: Supervision Sit to supine: Supervision   General bed mobility comments: increased time. supervision for safety/lines  Transfers Overall transfer level: Needs assistance Equipment used: None Transfers: Sit to/from Stand Sit to Stand: Min guard Stand pivot transfers: Min guard       General transfer comment: close guard for safety. Increased time. Remained on [place] O2.  Ambulation/Gait Ambulation/Gait assistance: Min guard Gait Distance (Feet): 25 Feet(x2) Assistive device: 4-wheeled walker Gait Pattern/deviations: Step-through pattern;Decreased stride length     General Gait Details: slow gait speed. Remained on [place] O2-sat 92% on 3L  O2, dyspnea 3/4. Seated rest break needed after ~25 feet.    Stairs             Wheelchair Mobility    Modified Rankin (Stroke Patients Only)       Balance Overall balance assessment: Needs  assistance           Standing balance-Leahy Scale: Fair                              Cognition Arousal/Alertness: Awake/alert Behavior During Therapy: WFL for tasks assessed/performed Overall Cognitive Status: Within Functional Limits for tasks assessed                                        Exercises      General Comments General comments (skin integrity, edema, etc.): WOB increased; sats 92-93% on 2 liters      Pertinent Vitals/Pain Pain Assessment: No/denies pain    Home Living Family/patient expects to be discharged to:: Private residence Living Arrangements: Spouse/significant other   Type of Home: House     Home Layout: Able to live on main level with bedroom/bathroom Home Equipment: None      Prior Function Level of Independence: Independent          PT Goals (current goals can now be found in the care plan section) Acute Rehab PT Goals Patient Stated Goal: return to independence Progress towards PT goals: Progressing toward goals    Frequency    Min 3X/week      PT Plan Current plan remains appropriate    Co-evaluation   Reason for Co-Treatment: For patient/therapist safety PT goals addressed during session: Mobility/safety with mobility OT goals addressed during session: ADL's and self-care  AM-PAC PT "6 Clicks" Daily Activity  Outcome Measure  Difficulty turning over in bed (including adjusting bedclothes, sheets and blankets)?: None Difficulty moving from lying on back to sitting on the side of the bed? : A Little Difficulty sitting down on and standing up from a chair with arms (e.g., wheelchair, bedside commode, etc,.)?: A Little Help needed moving to and from a bed to chair (including a wheelchair)?: A Little Help needed walking in hospital room?: A Little Help needed climbing 3-5 steps with a railing? : A Little 6 Click Score: 19    End of Session Equipment Utilized During Treatment:  Oxygen Activity Tolerance: Patient limited by fatigue(limited by dyspnea, coughing) Patient left: in bed;with call bell/phone within reach;with bed alarm set   PT Visit Diagnosis: Difficulty in walking, not elsewhere classified (R26.2)     Time: 0932-3557 PT Time Calculation (min) (ACUTE ONLY): 20 min  Charges:  $Gait Training: 8-22 mins                        Weston Anna, [place] Pager: [phone] Office: [phone]

## 2018-03-26 DIAGNOSIS — J9601 Acute respiratory failure with hypoxia: Secondary | ICD-10-CM

## 2018-03-26 DIAGNOSIS — J8489 Other specified interstitial pulmonary diseases: Secondary | ICD-10-CM

## 2018-03-26 DIAGNOSIS — M358 Other specified systemic involvement of connective tissue: Secondary | ICD-10-CM

## 2018-03-26 LAB — GLUCOSE, CAPILLARY: Glucose-Capillary: 121 mg/dL — ABNORMAL HIGH (ref 70–99)

## 2018-03-26 LAB — PROCALCITONIN: Procalcitonin: <0.1 ng/mL

## 2018-03-26 LAB — CREATININE, SERUM
Creatinine, Ser: 0.62 mg/dL (ref 0.44–1.00)
GFR calc Af Amer: >60 mL/min (ref >60)
GFR calc non Af Amer: >60 mL/min (ref >60)

## 2018-03-26 NOTE — Progress Notes (Signed)
PROGRESS NOTE    Stacey [name]  ZOX:[id number] DOB: Mar 02, 1941 DOA: 03/23/2018 PCP: Shon Baton, MD    Brief Narrative: 77 year old female with rheumatoid arthritis ILD, asthma diagnosed 2000 treated currently at [facility] by rheumatologist Asthma diagnosed with allergies as a child-has been on prednisone for decades when previously evaluated by Dr. Noel Christmas on [place] 2014 September for rheumatoid issues  Recently seen at pulmonology office 03/21/2018-noted respiration worse-mMRC 3 category on flat grade and was on Protonix in addition to reflux diet  Recently admitted to hospital 01/2018 with dyspnea hypoxemia-at the time echo EF 55-60% with grade 1 diastolic dysfunction-Esophagramwas done and patient was placed by pulmonary on Reglan as OP  Hus[place] that [place] and [place] current issues started-on going to [facility] on 9/4 was difficult for the patient-at baseline she is ambulatory without oxygen or without assistive aid except when she was discharged back in [place] she is not winded and is able to walk flat distances without too much distress   Returns to ED today with further issues--insomnia X 2 days since being seen at pulmonology office visit-was prescribed 8 mg prednisone at that visit, Hycodan was changed to Tylenol 3 to help with work of breathing-another transitional care management appointment was made for the patient to determine her polypharmacy and the need for the same  ED Course:Patient given continued patient given continuous neb in the emergency room and given magnesium-when I went down to see her she was off oxygen and when she ambulated for 5 steps she felt fearful and desatted to the high 80s with increased work of breathing 30 times per minute which resolved after about 5 minutes of deep pursed lip breathing   Glucose 112, WBC 14, lymphocytosis 4.6 BNP 89.4, troponin 0 0.03, 1 view chest x-ray  bibasilar atelectasis no edema consolidation, EKG showed sinus rhythm with artifact PR interval 0.08 QRS axis about 30 degrees no ischemic injury Assessment & Plan:   Principal Problem:   Acute respiratory failure with hypoxia ([place]) Active Problems:   Asthma   Interstitial lung disease due to connective tissue disease ([place])   Asthma exacerbation   Essential hypertension   Depression  1] Hypoxic respiratory failure with interstitial lung disease and rheumatoid lung and asthma -she admitted with complaints of dyspnea on exertion worsening shortness of breath and wheezing.  She reports feeling better with IV steroids.  Saturation dropped to 86% ambulating today.  Continue the same dose for now.  Vanco and cefepime stopped yesterday.  Continue doxycycline for 7 days.  Patient is MRSA PCR positive CT of the chest showed no acute PE, chronic interstitial changes in the lungs, slight improvement in the right lower lobe airspace filling opacities consistent with improving infectious process.  She reports coughing with drinking liquids.  I will obtain speech and swallow evaluation.  Appreciate PCCM input.  PCCM feels that this probably is a slowly progressive interstitial lung disease secondary to rheumatoid arthritis versus p.o. and rheumatoid arthritis versus asthma exacerbation.  Recommends continuing doxycycline for a total of 5 to 7 days.  Consults patient evaluation.  Will try to get spirometry on Monday hoping that she would be able much stable and be able to better participate in the spirometry.   2]HTNcontinue antihypertensives like she was taking at home blood pressure is up and down. Hydralazine PRN.  3]depression/bipolar disorder Home medications.  4] GERD continue Protonix.     DVT prophylaxis Lovenox Code Status: Full  code Family Communication: No family at bedside Disposition Plan: TBD Consultants: PCCM   Procedures: None Antimicrobials: Doxycycline Subjective: She  reports she is better hypoxic with ambulation to 86%  Objective: Vitals:   03/26/18 0026 03/26/18 0558 03/26/18 0853 03/26/18 1207  BP:  133/66    Pulse:  73    Resp:  18    Temp:  98 F (36.7 C)    TempSrc:  Oral    SpO2: 93% 97% 95% 96%  Weight:      Height:        Intake/Output Summary (Last 24 hours) at 03/26/2018 1303 Last data filed at 03/26/2018 0937 Gross per 24 hour  Intake 644.89 ml  Output 900 ml  Net -255.11 ml   Filed Weights   03/23/18 0708 03/23/18 1050  Weight: 63.2 kg 63.7 kg    Examination:  General exam: Appears calm and comfortable  Respiratory system: WHEEZING DECREASED Respiratory effort normal. Cardiovascular system: S1 & S2 heard, RRR. No JVD, murmurs, rubs, gallops or clicks. No pedal edema. Gastrointestinal system: Abdomen is nondistended, soft and nontender. No organomegaly or masses felt. Normal bowel sounds heard. Central nervous system: Alert and oriented. No focal neurological deficits. Extremities: Symmetric 5 x 5 power. Skin: No rashes, lesions or ulcers Psychiatry: Judgement and insight appear normal. Mood & affect appropriate.     Data Reviewed: I have personally reviewed following labs and imaging studies  CBC: Recent Labs  Lab 03/21/18 1119 03/23/18 0731 03/23/18 1101 03/24/18 0508  WBC 10.7* 14.1* 13.8* 12.0*  NEUTROABS 6.3 5.6  --  6.4  HGB 12.5 12.5 12.5 11.5*  HCT 37.4 39.0 39.6 36.1  MCV 89.9 94.4 95.0 94.0  PLT 358.0 375 367 161   Basic Metabolic Panel: Recent Labs  Lab 03/21/18 1119 03/23/18 0731 03/23/18 1101 03/24/18 0508 03/26/18 0507  NA 137 141  --  139  --   K 4.3 4.2  --  4.2  --   CL 99 101  --  100  --   CO2 32 29  --  32  --   GLUCOSE 102* 112*  --  91  --   BUN 14 11  --  12  --   CREATININE 0.87 0.67 0.76 0.60 0.62  CALCIUM 9.0 8.8*  --  8.6*  --    GFR: Estimated Creatinine Clearance: 54.3 mL/min (by C-G formula based on SCr of 0.62 mg/dL). Liver Function Tests: Recent Labs  Lab  03/24/18 0508  AST 18  ALT 14  ALKPHOS 50  BILITOT 0.4  PROT 6.0*  ALBUMIN 3.0*   No results for input(s): LIPASE, AMYLASE in the last 168 hours. No results for input(s): AMMONIA in the last 168 hours. Coagulation Profile: No results for input(s): INR, PROTIME in the last 168 hours. Cardiac Enzymes: Recent Labs  Lab 03/23/18 0731  TROPONINI <0.03   BNP (last 3 results) Recent Labs    03/21/18 1119  PROBNP 38.0   HbA1C: No results for input(s): HGBA1C in the last 72 hours. CBG: Recent Labs  Lab 03/24/18 0739 03/25/18 0757 03/26/18 0751  GLUCAP 82 123* 121*   Lipid Profile: No results for input(s): CHOL, HDL, LDLCALC, TRIG, CHOLHDL, LDLDIRECT in the last 72 hours. Thyroid Function Tests: No results for input(s): TSH, T4TOTAL, FREET4, T3FREE, THYROIDAB in the last 72 hours. Anemia Panel: No results for input(s): VITAMINB12, FOLATE, FERRITIN, TIBC, IRON, RETICCTPCT in the last 72 hours. Sepsis Labs: Recent Labs  Lab 03/24/18 1613 03/25/18 0448 03/26/18 0507  PROCALCITON <0.10 <0.10 <0.10    Recent Results (from the past 240 hour(s))  Respiratory Panel by PCR     Status: None   Collection Time: 03/24/18  4:02 PM  Result Value Ref Range Status   Adenovirus NOT DETECTED NOT DETECTED Final   Coronavirus 229E NOT DETECTED NOT DETECTED Final   Coronavirus HKU1 NOT DETECTED NOT DETECTED Final   Coronavirus NL63 NOT DETECTED NOT DETECTED Final   Coronavirus OC43 NOT DETECTED NOT DETECTED Final   Metapneumovirus NOT DETECTED NOT DETECTED Final   Rhinovirus / Enterovirus NOT DETECTED NOT DETECTED Final   Influenza A NOT DETECTED NOT DETECTED Final   Influenza B NOT DETECTED NOT DETECTED Final   Parainfluenza Virus 1 NOT DETECTED NOT DETECTED Final   Parainfluenza Virus 2 NOT DETECTED NOT DETECTED Final   Parainfluenza Virus 3 NOT DETECTED NOT DETECTED Final   Parainfluenza Virus 4 NOT DETECTED NOT DETECTED Final   Respiratory Syncytial Virus NOT DETECTED NOT  DETECTED Final   Bordetella pertussis NOT DETECTED NOT DETECTED Final   Chlamydophila pneumoniae NOT DETECTED NOT DETECTED Final   Mycoplasma pneumoniae NOT DETECTED NOT DETECTED Final    Comment: Performed at [facility], [place] [address], [place], [place] [address]  MRSA PCR Screening     Status: Abnormal   Collection Time: 03/25/18  9:49 AM  Result Value Ref Range Status   MRSA by PCR POSITIVE (A) NEGATIVE Final    Comment:        The GeneXpert MRSA Assay (FDA approved for NASAL specimens only), is one component of a comprehensive MRSA colonization surveillance program. It is not intended to diagnose MRSA infection nor to guide or monitor treatment for MRSA infections. RESULT CALLED TO, READ BACK BY AND VERIFIED WITH: A[place]ell [id number] @ [place] Performed at [facility], [place] [address], [place], [place] [address]          Radiology Studies: Ct Angio Chest Pe W Or Wo Contrast  Result Date: 03/24/2018 CLINICAL DATA:  History of interstitial lung disease, asthma. Respirations are worse. Hypoxia. EXAM: CT ANGIOGRAPHY CHEST WITH CONTRAST TECHNIQUE: Multidetector CT imaging of the chest was performed using the standard protocol during bolus administration of intravenous contrast. Multiplanar CT image reconstructions and MIPs were obtained to evaluate the vascular anatomy. CONTRAST:  154mL ISOVUE-370 IOPAMIDOL (ISOVUE-370) INJECTION 76% COMPARISON:  Chest x-ray 03/23/2018 FINDINGS: Cardiovascular: Coronary atherosclerosis. There is atherosclerotic calcification of the thoracic aorta not associated with aneurysm. The pulmonary arteries are well opacified by contrast bolus. No acute pulmonary embolus. Mediastinum/Nodes: Small hiatal hernia. No mediastinal, hilar, or axillary adenopathy. Lungs/Pleura: Study quality is degraded by patient motion artifact. There is biapical pleuroparenchymal scarring. Subpleural reticular changes and septal thickening  are identified throughout the lungs. Ground-glass opacities in the RIGHT LOWER lobe have improved slightly, consistent with resolving inflammatory/infectious process. No suspicious pulmonary nodules. No pleural effusions or consolidations. No pulmonary edema. Upper Abdomen: Cholecystectomy. Musculoskeletal: Superior endplate fractures of T5, T6, and T10 are favored to be chronic. Review of the MIP images confirms the above findings. IMPRESSION: 1. Technically adequate exam showing no acute pulmonary embolus. 2. Chronic interstitial changes in the lungs. 3. Slight improvement in RIGHT LOWER lobe airspace filling opacities, consistent with improving infectious process. 4.  Aortic atherosclerosis.  (ICD10-I70.0) Electronically Signed   By: Nolon Nations M.D.   On: 03/24/2018 15:40        Scheduled Meds: . amitriptyline  25 mg Oral QHS  .  Chlorhexidine Gluconate Cloth  6 each Topical Q0600  . chlorpheniramine-HYDROcodone  5 mL Oral Q12H  . cholecalciferol  1,000 Units Oral Daily  . doxycycline  100 mg Oral Q12H  . enoxaparin (LOVENOX) injection  40 mg Subcutaneous Daily  . feeding supplement (ENSURE ENLIVE)  237 mL Oral TID BM  . gabapentin  600 mg Oral QHS  . guaiFENesin  1,200 mg Oral BID  . ipratropium  0.5 mg Nebulization Q4H  . levalbuterol  1.25 mg Nebulization Q4H  . losartan  25 mg Oral Daily  . methylPREDNISolone (SOLU-MEDROL) injection  60 mg Intravenous Q6H  . metoCLOPramide  5 mg Oral TID AC  . multivitamin  1 tablet Oral Daily  . mupirocin ointment  1 application Nasal BID  . pantoprazole  40 mg Oral BID AC  . polyethylene glycol  17 g Oral Daily  . venlafaxine XR  75 mg Oral QHS   Continuous Infusions: . sodium chloride 250 mL (03/24/18 1801)     LOS: 3 days     Georgette Shell, MD [facility] If 7PM-7AM, please contact night-coverage [email or web address] Password [email or web address] 03/26/2018, 1:03 PM

## 2018-03-26 NOTE — Assessment & Plan Note (Addendum)
She might have mildly progressive ILD over some years esp July 2018 -  July 2019 thought this seems clouded by the fact that there is above upper airway issue  On 9/14 - imroved hypoxemia - desatuaring with 60 feet of exertion.  On 9/15 - has exertional hypoxemia - 79% at 90 feet (97% at rest prior to walk)   Plan Needs portable o2 - 2L [place] at discharge OPD ILD clinic followup - with MR Tue PM/THu AM - 30 min slot  - reassess exertional hypoxemia  - order ONO  - Discuss cellcept as outpatient   - ; depends on goals of care and given her fraility might not be a good option  - will need her [facility] rheumatologist input on this as well  OPD followup with MR at ILD clinic Tue PM/Thu AM - 30 min slot

## 2018-03-26 NOTE — Evaluation (Signed)
Clinical/Bedside Swallow Evaluation Patient Details  Name: LAASYA PEYTON MRN: [id number] Date of Birth: [birth year]  Today's Date: 03/26/2018 Time: SLP Start Time (ACUTE ONLY): 1640 SLP Stop Time (ACUTE ONLY): 1700 SLP Time Calculation (min) (ACUTE ONLY): 20 min  Past Medical History:  Past Medical History:  Diagnosis Date  . Abnormal finding of blood chemistry   . Asthma   . H/O measles   . H/O varicella   . Hypertension   . 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 Lichen sclerosus 40/98/11   Asymptomatic  . Low iron   . Mitral valve prolapse   . Osteoarthritis   . Osteoporosis   . Post herpetic neuralgia   . Rheumatoid arthritis(714.0)   . Yeast infection    Past Surgical History:  Past Surgical History:  Procedure Laterality Date  . CHOLECYSTECTOMY  2011  . WISDOM TOOTH EXTRACTION     HPI:  Patient is a 77 y.o. female with PMH: RA, ILD, GERD, depression, asthma diagnosed in 2000 who had been recently seen by pulmonology office on 03/21/18 with noted worsening respiration, who returned to ED with further issues of insomnia x2 days. 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    Assessment / Plan / Recommendation Clinical Impression  Patient presents with a mild pharyngeal dysphagia however based on patient report, h/o GERD and Esophagram completed on 01/25/18, suspect that patient's dysphagia is primarily esophageal in nature. Patient reports that she has had persistent coughing when drinking liquids since July of 2019 and the coughing is frequent enough that she is worried of getting choked or aspirating. Patient did exhibit delayed cough when drinking thin liquids, however cough was dry and hoarse and difficult to determine if it was related to aspiration or penetration or related to her GERD and/or respiratory/asthma. SLP spoke  to patient's attending MD (Dr. Rodena Piety) and decision was made to schedule MBS for 03/28/18 to r/o aspiration and to establish a baseline of her swallow function.  SLP Visit Diagnosis: Dysphagia, pharyngoesophageal phase (R13.14)    Aspiration Risk  Mild aspiration risk    Diet Recommendation     Liquid Administration via: Straw;Cup Medication Administration: Whole meds with liquid Supervision: Patient able to self feed Postural Changes: Seated upright at 90 degrees;Remain upright for at least 30 minutes after po intake    Other  Recommendations Oral Care Recommendations: Oral care BID   Follow up Recommendations None      Frequency and Duration min 1 x/week  1 week       Prognosis   Good     Swallow Study   General Date of Onset: 03/23/18 HPI: Patient is a 77 y.o. female with PMH: RA, ILD, GERD, depression, asthma diagnosed in 2000 who had been recently seen by pulmonology office on 03/21/18 with noted worsening respiration, who returned to ED with further issues of insomnia x2 days. 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  Type of Study: Bedside Swallow Evaluation Previous Swallow Assessment: 01/2018: recommendation was for management of GERD. Please see 01/25/18 Esophagram report. Diet Prior to this Study: Regular;Thin liquids;Other (Comment)(Patient reports preference for liquids instead of solid foods, since July) Temperature Spikes Noted: No Respiratory Status: Nasal cannula History of Recent Intubation: No Behavior/Cognition: Alert;Cooperative;Pleasant mood Oral Cavity Assessment: Within Functional  Limits Oral Care Completed by SLP: No Oral Cavity - Dentition: Adequate natural dentition Vision: Functional for self-feeding Self-Feeding Abilities: Able to feed self Patient Positioning: Upright in bed Baseline Vocal  Quality: Normal Volitional Cough: Strong Volitional Swallow: Able to elicit    Oral/Motor/Sensory Function Overall Oral Motor/Sensory Function: Within functional limits   Ice Chips Ice chips: Not tested   Thin Liquid Thin Liquid: Impaired Presentation: Cup;Straw Pharyngeal  Phase Impairments: Cough - Delayed Other Comments: Delayed cough was not wet-sounding and difficult to determine if related to patient swallowing thin liquids, or secondary to asthma, GERD symptoms    Nectar Thick Nectar Thick Liquid: Not tested   Honey Thick Honey Thick Liquid: Not tested   Puree Puree: Not tested   Solid     Solid: Not tested     Sonia Baller, MA, CCC-SLP 03/26/18 5:52 PM

## 2018-03-26 NOTE — Assessment & Plan Note (Signed)
Per acute rsp failure

## 2018-03-26 NOTE — Assessment & Plan Note (Addendum)
Admission problem and recurrence since July 2019.   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

## 2018-03-26 NOTE — Consult Note (Signed)
Stacey [name]  OIZ:[id number] DOB: [birth year] DOA: 03/23/2018 PCP: Shon Baton, MD    LOS: 3 days   Reason for Consult / Chief Complaint:  Acute Hypoxic Respiratory Failure  Consulting MD and date:  Dr. 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  Has been on many immune suppressants over decades and curently on orencia x 4 year and prednisone. Does not recollect being on bactrim/dapsone. Chart mentions BOOP/MAI in 2001 but she denies this. 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 Got admitted. Rx with steroids. Per Notes - clnical suspicion of  arytenoid inflmmation related wheeze noticed (she also reports asthma NOS). Follolwup with ENT recommended (but not seen one as yet). She also appears to have passed swallow with rec for regular diet with thin liquids but did to have mild eso stricture and reflux during testing . PFT shows 10% FVC decline for first ime. CT chest at this tme (aug 2019) showed new rLL infiltrate.  ECHO July 2019 without evicence of elevated PASP and saw cards [facility] June 2019 and was considered to have worsenin dyspnea due to [facility] issues (reports stress test at [facility] that was normal but I cannot see it)  She reports after discharge she got better but in last several weeks has deteriorated with cough and dyspnea. There is new hypoxemia (currently RA with nail polish and poor circulation  - 89% pulse ox) needing 2L [place]. Per [facility] improved with steroids and abx. CTA 03/24/2018 => shows that RLL inifltrate has improved . Other chronic ILD changes + and small  Hiatal hernia + witthout change.  Review of lab work does show eosinophilia at time of admision  EVENTS 03/21/18 - IgE -5, blood allegy panel -  negative, 03/23/2018 - - admit . HIGH EOS 2300, ESR 48, BNP 89 , HIV neg 9/12- PCT negative, RVP negative 9/14 -  PCT < 0.1, Urine strep - negative, MRSA PCR - positive   Subjective:  03/26/2018 - leading consideration for airway (BO in RA +/- asthma) related flare either due to MRSA bronchitis or clinical suspicion of arytenoid inflmmation +/- GERD relatd flare (she has small hiatal hernia)  +/- ? Dysphagia  up causing mild hypoxemia acute resp failure, wheeze . Allergy and IgE blood work negative thought  Patient reports being better but says she is choking on drinkin water [facility] MD says wheeze improved significantly with steroids.  RN says was down to RA yesterday evening but needed 1L [place] at sleep. Today -Room air at rest 94% and desaturated to 86% walking 60 feet  Objective   Blood pressure 133/66, pulse 73, temperature 98 F (36.7 C), temperature source Oral, resp. rate 18, height _0  (1.676 m), weight 63.7 kg, SpO2 95 %.        Intake/Output Summary (Last 24 hours) at 03/26/2018 0936 Last data filed at 03/26/2018 0100 Gross per 24 hour  Intake 184.89 ml  Output 900 ml  Net -715.11 ml   Filed Weights   03/23/18 0708 03/23/18 1050  Weight: 63.2 kg 63.7 kg    Labs    PULMONARY No results for input(s): PHART, PCO2ART, PO2ART, HCO3, TCO2, O2SAT in the last 168 hours.  Invalid input(s): PCO2, PO2  CBC Recent Labs  Lab 03/23/18 0731 03/23/18 1101 03/24/18 0508  HGB 12.5 12.5 11.5*  HCT 39.0 39.6 36.1  WBC 14.1* 13.8* 12.0*  PLT 375 367 324    COAGULATION No results for input(s): INR in the last 168 hours.  CARDIAC   Recent Labs  Lab 03/23/18 0731  TROPONINI <0.03   Recent Labs  Lab 03/21/18 1119  PROBNP 38.0     CHEMISTRY Recent Labs  Lab 03/21/18 1119 03/23/18 0731 03/23/18 1101 03/24/18 0508 03/26/18 0507  NA 137 141  --  139  --   K 4.3 4.2  --  4.2  --   CL 99 101  --  100  --   CO2 32 29  --  32  --   GLUCOSE 102* 112*  --  91  --   BUN  14 11  --  12  --   CREATININE 0.87 0.67 0.76 0.60 0.62  CALCIUM 9.0 8.8*  --  8.6*  --    Estimated Creatinine Clearance: 54.3 mL/min (by C-G formula based on SCr of 0.62 mg/dL).   LIVER Recent Labs  Lab 03/24/18 0508  AST 18  ALT 14  ALKPHOS 50  BILITOT 0.4  PROT 6.0*  ALBUMIN 3.0*     INFECTIOUS Recent Labs  Lab 03/24/18 1613 03/25/18 0448 03/26/18 0507  PROCALCITON <0.10 <0.10 <0.10     ENDOCRINE CBG (last 3)  Recent Labs    03/24/18 0739 03/25/18 0757 03/26/18 0751  GLUCAP 82 123* 121*         IMAGING x48h  - image(s) personally visualized  -   highlighted in bold Ct Angio Chest Pe W Or Wo Contrast  Result Date: 03/24/2018 CLINICAL DATA:  History of interstitial lung disease, asthma. Respirations are worse. Hypoxia. EXAM: CT ANGIOGRAPHY CHEST WITH CONTRAST TECHNIQUE: Multidetector CT imaging of the chest was performed using the standard protocol during bolus administration of intravenous contrast. Multiplanar CT image reconstructions and MIPs were obtained to evaluate the vascular anatomy. CONTRAST:  121m ISOVUE-370 IOPAMIDOL (ISOVUE-370) INJECTION 76% COMPARISON:  Chest x-ray 03/23/2018 FINDINGS: Cardiovascular: Coronary atherosclerosis. There is atherosclerotic calcification of the thoracic aorta not associated with aneurysm. The pulmonary arteries are well opacified by contrast bolus. No acute pulmonary embolus. Mediastinum/Nodes: Small hiatal hernia. No mediastinal, hilar, or axillary adenopathy. Lungs/Pleura: Study quality is degraded by patient motion artifact. There is biapical pleuroparenchymal scarring. Subpleural reticular changes and septal thickening are identified throughout the lungs. Ground-glass opacities in the RIGHT LOWER lobe have improved slightly, consistent with resolving inflammatory/infectious process. No suspicious pulmonary nodules. No pleural effusions or consolidations. No pulmonary edema. Upper Abdomen: Cholecystectomy.  Musculoskeletal: Superior endplate fractures of T5, T6, and T10 are favored to be chronic. Review of the MIP images confirms the above findings. IMPRESSION: 1. Technically adequate exam showing no acute pulmonary embolus. 2. Chronic interstitial changes in the lungs. 3. Slight improvement in RIGHT LOWER lobe airspace filling opacities, consistent with improving infectious process. 4.  Aortic atherosclerosis.  (ICD10-I70.0) Electronically Signed   By: ENolon NationsM.D.   On: 03/24/2018 15:40      Examination: General Appearance:  Frail, deconditioned. Walked with walker (does not use it at home) Head:  Normocephalic, without obvious abnormality, atraumatic Eyes:  PERRL - ye, conjunctiva/corneas - clear     Ears:  Normal external ear canals, both ears Nose:  G tube - no Throat:  ETT TUBE - no , OG tube - no Neck:  Supple,  No enlargement/tenderness/nodules Lungs:  No distress. No wheezing - resolved. Has bilateral bibasal crackles c/w UIP Heart:  S1 and S2 normal, no murmur, CVP - no.  Pressors - no Abdomen:  Soft, no masses, no organomegaly Genitalia / Rectal:  Not done Extremities:  Extremities- intact Skin:  ntact in exposed areas . Sacral area - x Neurologic:  Sedation - none -> RASS - +1 . Moves all 4s - yes. CAM-ICU - neg . Orientation - x3_     Assessment & Plan:  Acute respiratory failure with hypoxia ([place]) Admission problem. Very difficult to sort out precisse etiology. This is also  A recurrence since July 2019.   Wide Ddx  - including ILD flare, opportunistic infections, Resp viral infections., CAP., HCAP , PE - but all of this is unlikely/ruled out based on CT at admission and CT appearance . She does have RLL infiltrate that is new in Aug 2019 along with PFT decline compared to year earlier . This could be aspiration v opportunistic infection - but this infiltrate is better in Sep 2019  Current admission therefore appears mostly likely aireay related. This in turn  Is BO  in RA v true asthma (high eos at admission though IgE/allergy negative) made worse by ? Dysphagia/gerd. She is dramatically better with steroids  Underlying all this she might hae slowly progressive RA-ILD  PLAN - agree with narrow abx to doxy for 5-7 days - steroid burst over 2 weeks and then to baseline  - nebs and at dc mdi - opd ENT consult - consider repeat swallow eval in hospital - try to get spirometry in hospital if possible - o2 for pusle ox > 88%  Interstitial lung disease due to connective tissue disease ([place]) She might have mildly progressive ILD over some years. Currently improved hypoxemia - desatuaring with 60 feet of exertion. ? This is baseline - do not know  Plan Will need to consider cellcept as outpatient - atoeast have this discussion; depends on goals of care and given her fraility and overall stability of ILD might not be a good option  Asthma Per acute rsp failure    Disposition / Summary of Today's Plan 03/26/18   See above. D/w Dr Coralee Pesa at bedside    DVT prophylaxis: per triad GI prophylaxis: Pepcid, Protonix Diet: Diet as tolerated. Might need swallow evaluation - d/w [facility] Mobility: As tolerated Code Status: Full code Family Communication: Family updated at bedside 9/13 on NP rounds. Dr Chase Caller updated patient 03/26/2018 - who wants me to come back when husband is at bedside which I can do if I am still at [facility] rounding. Otherwise - over phone      SIGNATURE    Dr. Brand Males, M.D., F.C.C.P,  Pulmonary and Critical Care Medicine Staff Physician, [place] Director - Interstitial Lung Disease  Program  Pulmonary [facility] at [place], [place], [address]  Pager: [phone], If no answer or between  15:00h - 7:00h: call [phone] Telephone: [phone]  9:37 AM 03/26/2018

## 2018-03-27 LAB — COMPREHENSIVE METABOLIC PANEL
ALT: 16 U/L (ref 0–44)
AST: 21 U/L (ref 15–41)
Albumin: 3.1 g/dL — ABNORMAL LOW (ref 3.5–5.0)
Alkaline Phosphatase: 46 U/L (ref 38–126)
Anion gap: 10 (ref 5–15)
BUN: 21 mg/dL (ref 8–23)
CO2: 30 mmol/L (ref 22–32)
Calcium: 9 mg/dL (ref 8.9–10.3)
Chloride: 102 mmol/L (ref 98–111)
Creatinine, Ser: 0.58 mg/dL (ref 0.44–1.00)
GFR calc Af Amer: >60 mL/min (ref >60)
GFR calc non Af Amer: >60 mL/min (ref >60)
Glucose, Bld: 130 mg/dL — ABNORMAL HIGH (ref 70–99)
Potassium: 4.5 mmol/L (ref 3.5–5.1)
Sodium: 142 mmol/L (ref 135–145)
Total Bilirubin: 0.7 mg/dL (ref 0.3–1.2)
Total Protein: 5.6 g/dL — ABNORMAL LOW (ref 6.5–8.1)

## 2018-03-27 LAB — CBC
HCT: 36.2 % (ref 36.0–46.0)
Hemoglobin: 11.6 g/dL — ABNORMAL LOW (ref 12.0–15.0)
MCH: 30 pg (ref 26.0–34.0)
MCHC: 32 g/dL (ref 30.0–36.0)
MCV: 93.5 fL (ref 78.0–100.0)
Platelets: 328 10*3/uL (ref 150–400)
RBC: 3.87 MIL/uL (ref 3.87–5.11)
RDW: 15.5 % (ref 11.5–15.5)
WBC: 8.4 10*3/uL (ref 4.0–10.5)

## 2018-03-27 LAB — PHOSPHORUS: Phosphorus: 3 mg/dL (ref 2.5–4.6)

## 2018-03-27 LAB — MAGNESIUM: Magnesium: 2.2 mg/dL (ref 1.7–2.4)

## 2018-03-27 LAB — GLUCOSE, CAPILLARY: Glucose-Capillary: 130 mg/dL — ABNORMAL HIGH (ref 70–99)

## 2018-03-27 MED ORDER — MOMETASONE FURO-FORMOTEROL FUM 100-5 MCG/ACT IN AERO
2.0000 | INHALATION_SPRAY | Freq: Two times a day (BID) | RESPIRATORY_TRACT | Status: DC
  Filled 2018-03-27 (×2): qty 8.8

## 2018-03-27 MED ORDER — METHYLPREDNISOLONE SODIUM SUCC 125 MG IJ SOLR
60.0000 mg | Freq: Three times a day (TID) | INTRAMUSCULAR | Status: DC
  Administered 2018-03-27 – 2018-03-28 (×4): 60 mg via INTRAVENOUS
  Filled 2018-03-27 (×4): qty 2

## 2018-03-27 NOTE — Progress Notes (Signed)
Stacey [name]  SHF:[id number] DOB: [birth year] DOA: 03/23/2018 PCP: Shon Baton, MD    LOS: 4 days   Reason for Consult / Chief Complaint:  Acute Hypoxic Respiratory Failure  Consulting MD and date:  Dr. 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  Has been on many immune suppressants over decades and curently on orencia x 4 year and prednisone. Does not recollect being on bactrim/dapsone. Chart mentions BOOP/MAI in 2001 but she denies this. 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 Got admitted. Rx with steroids. Per Notes - clnical suspicion of  arytenoid inflmmation related wheeze noticed (she also reports asthma NOS). Follolwup with ENT recommended (but not seen one as yet). She also appears to have passed swallow with rec for regular diet with thin liquids but did to have mild eso stricture and reflux during testing . PFT shows 10% FVC decline for first ime. CT chest at this tme (aug 2019) showed new rLL infiltrate.  ECHO July 2019 without evicence of elevated PASP and saw cards [facility] June 2019 and was considered to have worsenin dyspnea due to [facility] issues (reports stress test at [facility] that was normal but I cannot see it)  She reports after discharge she got better but in last several weeks has deteriorated with cough and dyspnea. There is new hypoxemia (currently RA with nail polish and poor circulation  - 89% pulse ox) needing 2L [place]. Per [facility] improved with steroids and abx. CTA 03/24/2018 => shows that RLL inifltrate has improved . Other chronic ILD changes + and small  Hiatal hernia + witthout change.  Review of lab work does show eosinophilia at time of admision  EVENTS 03/21/18 - IgE -5, blood allegy panel -  negative, 03/23/2018 - - admit . HIGH EOS 2300, ESR 48, BNP 89 , HIV neg 9/12- PCT negative, RVP negative 9/14 -  PCT < 0.1, Urine strep - negative, MRSA PCR - positive.  atient reports being better but says she is choking on drinkin water [facility] MD says wheeze improved significantly with steroids.  RN says was down to RA yesterday evening but needed 1L [place] at sleep. Today -Room air at rest 94% and desaturated to 86% walking 60 feet.   leading consideration for airway (BO in RA +/- asthma) related flare either due to MRSA bronchitis or clinical suspicion of arytenoid inflmmation +/- GERD relatd flare (she has small hiatal hernia)  +/- ? Dysphagia  up causing mild hypoxemia acute resp failure, wheeze . Allergy and IgE blood work negative thought. Seen by SLP and gave hx of cough with lquidis since July 2019. Coughed with water for SLP. MBS ordered     Subjective:  03/27/2018  - better. Off o2 at rest. STill coughs with water and when lies down.  Husband at bedside. Both requesting ILD clinic followup . Desaturated t 79% walking 90 feet.  Objective   Blood pressure (!) 143/74, pulse 74, temperature 98.2 F (36.8 C), temperature source Oral, resp. rate 17, height '5\' 6"'  (1.676 m), weight 63.7 kg, SpO2 92 %.        Intake/Output Summary (Last 24 hours) at 03/27/2018 1016 Last data filed at 03/27/2018 0600 Gross per 24 hour  Intake 120 ml  Output 600 ml  Net -480 ml   Filed Weights   03/23/18 0708 03/23/18 1050  Weight: 63.2 kg 63.7 kg    Labs    PULMONARY No results for input(s): PHART, PCO2ART, PO2ART, HCO3, TCO2, O2SAT in the last 168 hours.  Invalid input(s): PCO2, PO2  CBC Recent Labs  Lab 03/23/18 1101 03/24/18 0508 03/27/18 0509  HGB 12.5 11.5* 11.6*  HCT 39.6 36.1 36.2  WBC 13.8* 12.0* 8.4  PLT 367 324 328    COAGULATION No results for input(s): INR in the last 168 hours.  CARDIAC   Recent Labs  Lab 03/23/18 0731  TROPONINI <0.03   Recent Labs  Lab  03/21/18 1119  PROBNP 38.0     CHEMISTRY Recent Labs  Lab 03/21/18 1119 03/23/18 0731 03/23/18 1101 03/24/18 0508 03/26/18 0507 03/27/18 0509  NA 137 141  --  139  --  142  K 4.3 4.2  --  4.2  --  4.5  CL 99 101  --  100  --  102  CO2 32 29  --  32  --  30  GLUCOSE 102* 112*  --  91  --  130*  BUN 14 11  --  12  --  21  CREATININE 0.87 0.67 0.76 0.60 0.62 0.58  CALCIUM 9.0 8.8*  --  8.6*  --  9.0  MG  --   --   --   --   --  2.2  PHOS  --   --   --   --   --  3.0   Estimated Creatinine Clearance: 54.3 mL/min (by C-G formula based on SCr of 0.58 mg/dL).   LIVER Recent Labs  Lab 03/24/18 0508 03/27/18 0509  AST 18 21  ALT 14 16  ALKPHOS 50 46  BILITOT 0.4 0.7  PROT 6.0* 5.6*  ALBUMIN 3.0* 3.1*     INFECTIOUS Recent Labs  Lab 03/24/18 1613 03/25/18 0448 03/26/18 0507  PROCALCITON <0.10 <0.10 <0.10     ENDOCRINE CBG (last 3)  Recent Labs    03/25/18 0757 03/26/18 0751 03/27/18 0827  GLUCAP 123* 121* 130*         IMAGING x48h  - image(s) personally visualized  -   highlighted in bold No results found.    Examination: General Appearance:  Lean. Looks better. Off o2 Head:  Normocephalic, without obvious abnormality, atraumatic Eyes:  PERRL - yes, conjunctiva/corneas - clear     Ears:  Normal external ear canals, both ears Nose:  G tube - no Throat:  ETT TUBE - no , OG tube - no Neck:  Supple,  No enlargement/tenderness/nodules Lungs: scattered upper airway variable transmitted noise heard.  + bilateral LL velcro  crackles Heart:  S1 and S2 normal, no murmur, CVP - no.  Pressors - no Abdomen:  Soft, no masses, no organomegaly Genitalia / Rectal:  Not done Extremities:  Extremities- intact Skin:  ntact in exposed areas .  Neurologic:  Sedation - none -> RASS - +1 . Moves all 4s - yes. CAM-ICU - neg . Orientation - x3+        Assessment & Plan:  Acute respiratory failure with hypoxia [facility]) Admission problem and recurrence since  July 2019.   Current admission therefore appears mostly likely airway related and is either Asthma or Asthma Mimic (aspiration, GERD or arytenoids ) or both - based on hx of cough with water, lying down, GI motility issues discovered July 2019,  Variable upper airway noise on auscultation and high IgE and HRCT with improving RLL infitlrate in 1 month but  No GGO and stable/mild progresive PROBABLE UIP over years  PLAN - agree with narrow abx to doxy for 5-7 days - steroid burst over 2 weeks and then to baseline - start dulera 2 puff bid (needs FeNO at follolowup)  - alb prn nebs  - opd ENT consult - opd GI consult (dr Silverio Decamp for motility) - consider repeat swallow eval in hospital -incentive  Spirometry - inpatient PFT - spirometry ordered - o2 for pusle ox > 88%  - OPD ILD clinic followup  Interstitial lung disease due to connective tissue disease ([place]) She might have mildly progressive ILD over some years esp July 2018 -  July 2019 thought this seems clouded by the fact that there is above upper airway issue  On 9/14 - imroved hypoxemia - desatuaring with 60 feet of exertion.  On 9/15 - has exertional hypoxemia - 79% at 90 feet (97% at rest prior to walk)   Plan Needs portable o2 - 2L [place] at discharge OPD ILD clinic followup - with MR Tue PM/THu AM - 30 min slot  - reassess exertional hypoxemia  - order ONO  - Discuss cellcept as outpatient   - ; depends on goals of care and given her fraility might not be a good option  - will need her [facility] rheumatologist input on this as well  OPD followup with MR at ILD clinic Tue PM/Thu AM - 30 min slot  Asthma Per acute rsp failure    Disposition / Summary of Today's Plan 03/27/18   Getting close to dc. Possible DC 03/28/18 after inpatient PFT and ? MBS. Needs opd followup Dr Silverio Decamp (motility), ENT and Dr Brantley Persons ILD clinic    DVT prophylaxis: per [facility] GI prophylaxis: Pepcid, Protonix Diet: Diet as tolerated. Might need  swallow evaluation - d/w [facility] Mobility: As tolerated Code Status: Full code Family Communication: Family updated at bedside 9/13 on NP rounds. Dr Chase Caller updated patient and husband 9/14/`19 extensively at bedside.     SIGNATURE    Dr. Brand Males, M.D., F.C.C.P,  Pulmonary and Critical Care Medicine Staff Physician, [place] Director - Interstitial Lung Disease  Program  Pulmonary [place] at [place], [place], [address]  Pager: [phone], If no answer or between  15:00h - 7:00h: call [phone] Telephone: [phone]  10:16 AM 03/27/2018

## 2018-03-27 NOTE — Progress Notes (Signed)
SATURATION QUALIFICATIONS: (This note is used to comply with regulatory documentation for home oxygen)  Patient Saturations on Room Air at Rest = 95%  Patient Saturations on Room Air while Ambulating = 79%  Patient Saturations on 2 Liters of oxygen while Ambulating = 92%  Please briefly explain why patient needs home oxygen:

## 2018-03-27 NOTE — Progress Notes (Signed)
PROGRESS NOTE    Stacey [name]  KPT:[id number] DOB: 1941-05-14 DOA: 03/23/2018 PCP: Shon Baton, MD   Brief Narrative:77 year old female with rheumatoid arthritis ILD, asthma diagnosed 2000 treated currently at [facility] by rheumatologist Asthma diagnosed with allergies as a child-has been on prednisone for decades when previously evaluated by Dr. Noel Christmas on Orencia 2014 September for rheumatoid issues  Recently seen at pulmonology office 03/21/2018-noted respiration worse-mMRC 3 category on flat grade and was on Protonix in addition to reflux diet  Recently admitted to hospital 01/2018 with dyspnea hypoxemia-at the time echo EF 55-60% with grade 1 diastolic dysfunction-Esophagramwas done and patient was placed by pulmonary on Reglan as OP  Husband relates that [place] and that is where current issues started-on going to [facility] on 9/4 was difficult for the patient-at baseline she is ambulatory without oxygen or without assistive aid except when she was discharged back in [place] she is not winded and is able to walk flat distances without too much distress   Returns to ED today with further issues--insomnia X 2 days since being seen at pulmonology office visit-was prescribed 8 mg prednisone at that visit, Hycodan was changed to Tylenol 3 to help with work of breathing-another transitional care management appointment was made for the patient to determine her polypharmacy and the need for the same  ED Course:Patient given continued patient given continuous neb in the emergency room and given magnesium-when I went down to see her she was off oxygen and when she ambulated for 5 steps she felt fearful and desatted to the high 80s with increased work of breathing   Assessment & Plan:   Principal Problem:   Acute respiratory failure with hypoxia ([place]) Active Problems:   Asthma   Interstitial lung disease due to connective tissue  disease ([place])   Asthma exacerbation   Essential hypertension   Depression   1]Hypoxic respiratory failure with interstitial lung disease and rheumatoid lung and asthma -she admitted with complaints of dyspnea on exertion worsening shortness of breath and wheezing.  She reports feeling better with IV steroids.  Saturation dropped to 86% ambulating today.  Continue the same dose for now.  Vanco and cefepime stopped yesterday.  Continue doxycycline for 7 days.  Patient is MRSA PCR positiveCT of the chest showed no acute PE, chronic interstitial changes in the lungs, slight improvement in the right lower lobe airspace filling opacities consistent with improving infectious process.  She reports coughing with drinking liquids.  I will obtain speech and swallow evaluation.  Appreciate PCCM input.  PCCM feels that this probably is a slowly progressive interstitial lung disease secondary to rheumatoid arthritis versus p.o. and rheumatoid arthritis versus asthma exacerbation.  Recommends continuing doxycycline for a total of 5 to 7 days.MBS Monday.SPIROMETRY Monday.SHE desaturated to 79 %on ambulation.   2]HTNcontinue antihypertensives like she was taking at home blood pressure is up and down. Hydralazine PRN.  3]depression/bipolar disorder Home medications.  4] GERD continue Protonix.   DVT prophylaxis: lovenox Code Status:full Family Communication:dw husband Disposition Plan:tbd   Consultants: pccm  Procedures:none Antimicrobials: doxy Subjective: Feels better still coughing and wheezing Objective: Vitals:   03/27/18 0114 03/27/18 0419 03/27/18 0903 03/27/18 1304  BP:  (!) 143/74  (!) 135/57  Pulse:  74  77  Resp:  17  18  Temp:  98.2 F (36.8 C)  98.7 F (37.1 C)  TempSrc:  Oral  Oral  SpO2: 97% 98% 92% 99%  Weight:  Height:        Intake/Output Summary (Last 24 hours) at 03/27/2018 1415 Last data filed at 03/27/2018 1307 Gross per 24 hour  Intake 600 ml  Output 600  ml  Net 0 ml   Filed Weights   03/23/18 0708 03/23/18 1050  Weight: 63.2 kg 63.7 kg    Examination:  General exam: Appears calm and comfortable  Respiratory system: wheezing auscultation. Respiratory effort normal. Cardiovascular system: S1 & S2 heard, RRR. No JVD, murmurs, rubs, gallops or clicks. No pedal edema. Gastrointestinal system: Abdomen is nondistended, soft and nontender. No organomegaly or masses felt. Normal bowel sounds heard. Central nervous system: Alert and oriented. No focal neurological deficits. Extremities: Symmetric 5 x 5 power. Skin: No rashes, lesions or ulcers Psychiatry: Judgement and insight appear normal. Mood & affect appropriate.     Data Reviewed: I have personally reviewed following labs and imaging studies  CBC: Recent Labs  Lab 03/21/18 1119 03/23/18 0731 03/23/18 1101 03/24/18 0508 03/27/18 0509  WBC 10.7* 14.1* 13.8* 12.0* 8.4  NEUTROABS 6.3 5.6  --  6.4  --   HGB 12.5 12.5 12.5 11.5* 11.6*  HCT 37.4 39.0 39.6 36.1 36.2  MCV 89.9 94.4 95.0 94.0 93.5  PLT 358.0 375 367 324 030   Basic Metabolic Panel: Recent Labs  Lab 03/21/18 1119 03/23/18 0731 03/23/18 1101 03/24/18 0508 03/26/18 0507 03/27/18 0509  NA 137 141  --  139  --  142  K 4.3 4.2  --  4.2  --  4.5  CL 99 101  --  100  --  102  CO2 32 29  --  32  --  30  GLUCOSE 102* 112*  --  91  --  130*  BUN 14 11  --  12  --  21  CREATININE 0.87 0.67 0.76 0.60 0.62 0.58  CALCIUM 9.0 8.8*  --  8.6*  --  9.0  MG  --   --   --   --   --  2.2  PHOS  --   --   --   --   --  3.0   GFR: Estimated Creatinine Clearance: 54.3 mL/min (by C-G formula based on SCr of 0.58 mg/dL). Liver Function Tests: Recent Labs  Lab 03/24/18 0508 03/27/18 0509  AST 18 21  ALT 14 16  ALKPHOS 50 46  BILITOT 0.4 0.7  PROT 6.0* 5.6*  ALBUMIN 3.0* 3.1*   No results for input(s): LIPASE, AMYLASE in the last 168 hours. No results for input(s): AMMONIA in the last 168 hours. Coagulation  Profile: No results for input(s): INR, PROTIME in the last 168 hours. Cardiac Enzymes: Recent Labs  Lab 03/23/18 0731  TROPONINI <0.03   BNP (last 3 results) Recent Labs    03/21/18 1119  PROBNP 38.0   HbA1C: No results for input(s): HGBA1C in the last 72 hours. CBG: Recent Labs  Lab 03/24/18 0739 03/25/18 0757 03/26/18 0751 03/27/18 0827  GLUCAP 82 123* 121* 130*   Lipid Profile: No results for input(s): CHOL, HDL, LDLCALC, TRIG, CHOLHDL, LDLDIRECT in the last 72 hours. Thyroid Function Tests: No results for input(s): TSH, T4TOTAL, FREET4, T3FREE, THYROIDAB in the last 72 hours. Anemia Panel: No results for input(s): VITAMINB12, FOLATE, FERRITIN, TIBC, IRON, RETICCTPCT in the last 72 hours. Sepsis Labs: Recent Labs  Lab 03/24/18 1613 03/25/18 0448 03/26/18 0507  PROCALCITON <0.10 <0.10 <0.10    Recent Results (from the past 240 hour(s))  Respiratory Panel by PCR  Status: None   Collection Time: 03/24/18  4:02 PM  Result Value Ref Range Status   Adenovirus NOT DETECTED NOT DETECTED Final   Coronavirus 229E NOT DETECTED NOT DETECTED Final   Coronavirus HKU1 NOT DETECTED NOT DETECTED Final   Coronavirus NL63 NOT DETECTED NOT DETECTED Final   Coronavirus OC43 NOT DETECTED NOT DETECTED Final   Metapneumovirus NOT DETECTED NOT DETECTED Final   Rhinovirus / Enterovirus NOT DETECTED NOT DETECTED Final   Influenza A NOT DETECTED NOT DETECTED Final   Influenza B NOT DETECTED NOT DETECTED Final   Parainfluenza Virus 1 NOT DETECTED NOT DETECTED Final   Parainfluenza Virus 2 NOT DETECTED NOT DETECTED Final   Parainfluenza Virus 3 NOT DETECTED NOT DETECTED Final   Parainfluenza Virus 4 NOT DETECTED NOT DETECTED Final   Respiratory Syncytial Virus NOT DETECTED NOT DETECTED Final   Bordetella pertussis NOT DETECTED NOT DETECTED Final   Chlamydophila pneumoniae NOT DETECTED NOT DETECTED Final   Mycoplasma pneumoniae NOT DETECTED NOT DETECTED Final    Comment: Performed  at [facility], [place] [address] [place], [place] [address]  MRSA PCR Screening     Status: Abnormal   Collection Time: 03/25/18  9:49 AM  Result Value Ref Range Status   MRSA by PCR POSITIVE (A) NEGATIVE Final    Comment:        The GeneXpert MRSA Assay (FDA approved for NASAL specimens only), is one component of a comprehensive MRSA colonization surveillance program. It is not intended to diagnose MRSA infection nor to guide or monitor treatment for MRSA infections. RESULT CALLED TO, READ BACK BY AND VERIFIED WITH: [place] [id number] @ [place] Performed at [facility], [place] [address] [place], [place] [address]          Radiology Studies: No results found.      Scheduled Meds: . amitriptyline  25 mg Oral QHS  . Chlorhexidine Gluconate Cloth  6 each Topical Q0600  . chlorpheniramine-HYDROcodone  5 mL Oral Q12H  . cholecalciferol  1,000 Units Oral Daily  . doxycycline  100 mg Oral Q12H  . enoxaparin (LOVENOX) injection  40 mg Subcutaneous Daily  . feeding supplement (ENSURE ENLIVE)  237 mL Oral TID BM  . gabapentin  600 mg Oral QHS  . guaiFENesin  1,200 mg Oral BID  . losartan  25 mg Oral Daily  . methylPREDNISolone (SOLU-MEDROL) injection  60 mg Intravenous Q8H  . metoCLOPramide  5 mg Oral TID AC  . mometasone-formoterol  2 puff Inhalation BID  . multivitamin  1 tablet Oral Daily  . mupirocin ointment  1 application Nasal BID  . pantoprazole  40 mg Oral BID AC  . polyethylene glycol  17 g Oral Daily  . venlafaxine XR  75 mg Oral QHS   Continuous Infusions: . sodium chloride Stopped (03/26/18 0919)     LOS: 4 days     Georgette Shell, MD [facility]  If 7PM-7AM, please contact night-coverage [email or web address] Password [email or web address] 03/27/2018, 2:15 PM

## 2018-03-28 ENCOUNTER — Inpatient Hospital Stay (HOSPITAL_COMMUNITY): Payer: Medicare Other

## 2018-03-28 ENCOUNTER — Encounter (HOSPITAL_COMMUNITY): Payer: Medicare Other

## 2018-03-28 ENCOUNTER — Telehealth: Payer: Self-pay | Admitting: Internal Medicine

## 2018-03-28 LAB — CREATININE, SERUM
Creatinine, Ser: 0.59 mg/dL (ref 0.44–1.00)
GFR calc Af Amer: >60 mL/min (ref >60)
GFR calc non Af Amer: >60 mL/min (ref >60)

## 2018-03-28 LAB — GLUCOSE, CAPILLARY: Glucose-Capillary: 109 mg/dL — ABNORMAL HIGH (ref 70–99)

## 2018-03-28 LAB — PHOSPHORUS: Phosphorus: 3.4 mg/dL (ref 2.5–4.6)

## 2018-03-28 LAB — MAGNESIUM: Magnesium: 2.1 mg/dL (ref 1.7–2.4)

## 2018-03-28 MED ORDER — PREDNISONE 50 MG PO TABS
50.0000 mg | ORAL_TABLET | Freq: Every day | ORAL | Status: DC
  Administered 2018-03-29: 50 mg via ORAL
  Filled 2018-03-28: qty 1

## 2018-03-28 MED ORDER — HYDRALAZINE HCL 20 MG/ML IJ SOLN
5.0000 mg | Freq: Once | INTRAMUSCULAR | Status: AC
  Administered 2018-03-28: 5 mg via INTRAVENOUS
  Filled 2018-03-28: qty 1

## 2018-03-28 NOTE — Telephone Encounter (Signed)
LMTCB

## 2018-03-28 NOTE — Telephone Encounter (Signed)
Fine with me

## 2018-03-28 NOTE — Progress Notes (Signed)
PROGRESS NOTE    Stacey [name]  JSE:[id number] DOB: March 24, 1941 DOA: 03/23/2018 PCP: Shon Baton, MD  Brief Narrative: 77 year old female with rheumatoid arthritis ILD, asthma diagnosed 2000 treated currently at [facility] by rheumatologist Asthma diagnosed with allergies as a child-has been on prednisone for decades when previously evaluated by Dr. Noel Christmas on Orencia 2014 September for rheumatoid issues  Recently seen at pulmonology office 03/21/2018-noted respiration worse-mMRC 3 category on flat grade and was on Protonix in addition to reflux diet  Recently admitted to hospital 01/2018 with dyspnea hypoxemia-at the time echo EF 55-60% with grade 1 diastolic dysfunction-Esophagramwas done and patient was placed by pulmonary on Reglan as OP  Husband relates that [place] went [place] the mountains and that is where current issues started-on going to [facility] on 9/4 was difficult for the patient-at baseline she is ambulatory without oxygen or without assistive aid except when she was discharged back in [place] she is not winded and is able to walk flat distances without too much distress   Returns to ED today with further issues--insomnia X 2 days since being seen at pulmonology office visit-was prescribed 8 mg prednisone at that visit, Hycodan was changed to Tylenol 3 to help with work of breathing-another transitional care management appointment was made for the patient to determine her polypharmacy and the need for the same  ED Course:Patient given continued patient given continuous neb in the emergency room and given magnesium-when I went down to see her she was off oxygen and when she ambulated for 5 steps she felt fearful and desatted to the high 80s with increased work of breathing   Assessment & Plan:   Principal Problem:   Acute respiratory failure with hypoxia ([place]) Active Problems:   Asthma   Interstitial lung disease due to connective tissue  disease ([place])   Asthma exacerbation   Essential hypertension   Depression   1]Hypoxic respiratory failure with interstitial lung disease and rheumatoid lung and asthma -she admitted with complaints of dyspnea on exertion worsening shortness of breath and wheezing.She reports feeling better with IV steroids. Saturation dropped to 86% ambulating today. Continue the same dose for now. Vanco and cefepime stopped yesterday. Continue doxycycline for 7 days. Patient is MRSA PCR positiveCT of the chest showed no acute PE, chronic interstitial changes in the lungs, slight improvement in the right lower lobe airspace filling opacities consistent with improving infectious process.She reports coughing with drinking liquids. I will obtain speech and swallow evaluation. Appreciate PCCM input. PCCM feels that this probably is a slowly progressive interstitial lung disease secondary to rheumatoid arthritis versus p.o. and rheumatoid arthritis versus asthma exacerbation. Recommends continuing doxycycline for a total of 5 to 7 days.SHE desaturated to 79 %on ambulation.MBS doesn't really show aspiration.  PFTs ordered today.  DC IV steroids and start prednisone.  2]HTNcontinue antihypertensives like she was taking at home blood pressure is up and down. Hydralazine PRN.  3]depression/bipolar disorder Home medications.  4]GERD continue Protonix.   DVT prophylaxis:lovenox Code Status:full Family Communication none Disposition Plan:tbd   Consultants:P CCM  Procedures none  antimicrobials:DOXY  SubjectiveS: He is better cough is better still continues on IV steroids.   Objective: Vitals:   03/28/18 0535 03/28/18 1021 03/28/18 1227 03/28/18 1306  BP:    (!) 149/76  Pulse:    75  Resp:      Temp:    97.9 F (36.6 C)  TempSrc:      SpO2: 97% 92% (!) 78% 100%  Weight:      Height:        Intake/Output Summary (Last 24 hours) at 03/28/2018 1340 Last data filed at 03/28/2018  0200 Gross per 24 hour  Intake 60 ml  Output 200 ml  Net -140 ml   Filed Weights   03/23/18 0708 03/23/18 1050  Weight: 63.2 kg 63.7 kg    Examination:  General exam: Appears calm and comfortable  Respiratory system: Wheezing bilaterally to auscultation. Respiratory effort normal. Cardiovascular system: S1 & S2 heard, RRR. No JVD, murmurs, rubs, gallops or clicks. No pedal edema. Gastrointestinal system: Abdomen is nondistended, soft and nontender. No organomegaly or masses felt. Normal bowel sounds heard. Central nervous system: Alert and oriented. No focal neurological deficits. Extremities: Symmetric 5 x 5 power. Skin: No rashes, lesions or ulcers Psychiatry: Judgement and insight appear normal. Mood & affect appropriate.     Data Reviewed: I have personally reviewed following labs and imaging studies  CBC: Recent Labs  Lab 03/23/18 0731 03/23/18 1101 03/24/18 0508 03/27/18 0509  WBC 14.1* 13.8* 12.0* 8.4  NEUTROABS 5.6  --  6.4  --   HGB 12.5 12.5 11.5* 11.6*  HCT 39.0 39.6 36.1 36.2  MCV 94.4 95.0 94.0 93.5  PLT 375 367 324 413   Basic Metabolic Panel: Recent Labs  Lab 03/23/18 0731 03/23/18 1101 03/24/18 0508 03/26/18 0507 03/27/18 0509 03/28/18 0443  NA 141  --  139  --  142  --   K 4.2  --  4.2  --  4.5  --   CL 101  --  100  --  102  --   CO2 29  --  32  --  30  --   GLUCOSE 112*  --  91  --  130*  --   BUN 11  --  12  --  21  --   CREATININE 0.67 0.76 0.60 0.62 0.58 0.59  CALCIUM 8.8*  --  8.6*  --  9.0  --   MG  --   --   --   --  2.2 2.1  PHOS  --   --   --   --  3.0 3.4   GFR: Estimated Creatinine Clearance: 54.3 mL/min (by C-G formula based on SCr of 0.59 mg/dL). Liver Function Tests: Recent Labs  Lab 03/24/18 0508 03/27/18 0509  AST 18 21  ALT 14 16  ALKPHOS 50 46  BILITOT 0.4 0.7  PROT 6.0* 5.6*  ALBUMIN 3.0* 3.1*   No results for input(s): LIPASE, AMYLASE in the last 168 hours. No results for input(s): AMMONIA in the last 168  hours. Coagulation Profile: No results for input(s): INR, PROTIME in the last 168 hours. Cardiac Enzymes: Recent Labs  Lab 03/23/18 0731  TROPONINI <0.03   BNP (last 3 results) Recent Labs    03/21/18 1119  PROBNP 38.0   HbA1C: No results for input(s): HGBA1C in the last 72 hours. CBG: Recent Labs  Lab 03/24/18 0739 03/25/18 0757 03/26/18 0751 03/27/18 0827 03/28/18 0741  GLUCAP 82 123* 121* 130* 109*   Lipid Profile: No results for input(s): CHOL, HDL, LDLCALC, TRIG, CHOLHDL, LDLDIRECT in the last 72 hours. Thyroid Function Tests: No results for input(s): TSH, T4TOTAL, FREET4, T3FREE, THYROIDAB in the last 72 hours. Anemia Panel: No results for input(s): VITAMINB12, FOLATE, FERRITIN, TIBC, IRON, RETICCTPCT in the last 72 hours. Sepsis Labs: Recent Labs  Lab 03/24/18 1613 03/25/18 0448 03/26/18 0507  PROCALCITON <0.10 <0.10 <0.10  Recent Results (from the past 240 hour(s))  Respiratory Panel by PCR     Status: None   Collection Time: 03/24/18  4:02 PM  Result Value Ref Range Status   Adenovirus NOT DETECTED NOT DETECTED Final   Coronavirus 229E NOT DETECTED NOT DETECTED Final   Coronavirus HKU1 NOT DETECTED NOT DETECTED Final   Coronavirus NL63 NOT DETECTED NOT DETECTED Final   Coronavirus OC43 NOT DETECTED NOT DETECTED Final   Metapneumovirus NOT DETECTED NOT DETECTED Final   Rhinovirus / Enterovirus NOT DETECTED NOT DETECTED Final   Influenza A NOT DETECTED NOT DETECTED Final   Influenza B NOT DETECTED NOT DETECTED Final   Parainfluenza Virus 1 NOT DETECTED NOT DETECTED Final   Parainfluenza Virus 2 NOT DETECTED NOT DETECTED Final   Parainfluenza Virus 3 NOT DETECTED NOT DETECTED Final   Parainfluenza Virus 4 NOT DETECTED NOT DETECTED Final   Respiratory Syncytial Virus NOT DETECTED NOT DETECTED Final   Bordetella pertussis NOT DETECTED NOT DETECTED Final   Chlamydophila pneumoniae NOT DETECTED NOT DETECTED Final   Mycoplasma pneumoniae NOT DETECTED  NOT DETECTED Final    Comment: Performed at [facility], [place] [address] [place], [place] [address]  MRSA PCR Screening     Status: Abnormal   Collection Time: 03/25/18  9:49 AM  Result Value Ref Range Status   MRSA by PCR POSITIVE (A) NEGATIVE Final    Comment:        The GeneXpert MRSA Assay (FDA approved for NASAL specimens only), is one component of a comprehensive MRSA colonization surveillance program. It is not intended to diagnose MRSA infection nor to guide or monitor treatment for MRSA infections. RESULT CALLED TO, READ BACK BY AND VERIFIED WITH: [place] [id number] @ [place] Performed at [facility], [place] [address] [place], [place] [address]          Radiology Studies: Dg Swallowing Func-speech Pathology  Result Date: 03/28/2018 Objective Swallowing Evaluation: Type of Study: Bedside Swallow Evaluation  Patient Details Name: ISELA STANTZ MRN: [id number] Date of Birth: 08/24/1940 Today's Date: 03/28/2018 Time: SLP Start Time (ACUTE ONLY): 0840 -SLP Stop Time (ACUTE ONLY): 0857 SLP Time Calculation (min) (ACUTE ONLY): 17 min Past Medical History: Past Medical History: Diagnosis Date . Abnormal finding of blood chemistry  . Asthma  . H/O measles  . H/O varicella  . Hypertension  . 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 Lichen sclerosus 13/08/65  Asymptomatic . Low iron  . Mitral valve prolapse  . Osteoarthritis  . Osteoporosis  . Post herpetic neuralgia  . Rheumatoid arthritis(714.0)  . Yeast infection  Past Surgical History: Past Surgical History: Procedure Laterality Date . CHOLECYSTECTOMY  2011 . WISDOM TOOTH EXTRACTION   HPI: Patient is a 77 y.o. female with PMH: RA, ILD, GERD, depression, asthma diagnosed in 2000 who had been recently seen by pulmonology office on 03/21/18 with noted worsening respiration, who returned to ED with further issues of insomnia x2 days. Patient being treated for acute respiratory failure with hypoxia, with  active problems of asthma exacerbation, essential HTN, depression and interstitial lung disease due to connective tissure disease.  Subjective: pleasant sitting upright in chair Assessment / Plan / Recommendation CHL IP CLINICAL IMPRESSIONS 03/28/2018 Clinical Impression Patient presents with functional oropharyngeal swallow ability.  Swallow was timely and strong without residuals.  She demonstrated trace flash laryngeal penetration of thin when taking barium tablet which is [facility].   barium tablet taken with thin appeared to lodge  at [place] and did not clear with 2 extra boluses of thin, pudding bolus transited tablet into stomach - pt without sensation.  Recommend pt continue diet as tolerated incorporating strict esophageal compensation strategies.  Of note, pt coughed x1 before MBS and a moderate amount after MBS but not during - ? refluxing?   SLP Visit Diagnosis Dysphagia, unspecified (R13.10) Attention and concentration deficit following -- Frontal lobe and executive function deficit following -- Impact on safety and function Mild aspiration risk   CHL IP TREATMENT RECOMMENDATION 03/28/2018 Treatment Recommendations Therapy as outlined in treatment plan below   Prognosis 03/28/2018 Prognosis for Safe Diet Advancement Good Barriers to Reach Goals -- Barriers/Prognosis Comment -- CHL IP DIET RECOMMENDATION 03/28/2018 SLP Diet Recommendations Regular solids;Thin liquid Liquid Administration via Cup;Straw Medication Administration Whole meds with liquid Compensations Slow rate;Small sips/bites Postural Changes Remain semi-upright after after feeds/meals (Comment);Seated upright at 90 degrees   CHL IP OTHER RECOMMENDATIONS 03/28/2018 Recommended Consults -- Oral Care Recommendations Oral care BID Other Recommendations --   CHL IP FOLLOW UP RECOMMENDATIONS 03/26/2018 Follow up Recommendations None   CHL IP FREQUENCY AND DURATION 03/28/2018 Speech Therapy Frequency (ACUTE ONLY) min 1 x/week Treatment Duration 1 week      CHL IP  ORAL PHASE 03/28/2018 Oral Phase WFL Oral - Pudding Teaspoon -- Oral - Pudding Cup -- Oral - Honey Teaspoon -- Oral - Honey Cup -- Oral - Nectar Teaspoon -- Oral - Nectar Cup WFL Oral - Nectar Straw -- Oral - Thin Teaspoon -- Oral - Thin Cup WFL Oral - Thin Straw WFL Oral - Puree WFL Oral - Mech Soft -- Oral - Regular -- Oral - Multi-Consistency -- Oral - Pill WFL Oral Phase - Comment --  CHL IP PHARYNGEAL PHASE 03/28/2018 Pharyngeal Phase WFL Pharyngeal- Pudding Teaspoon -- Pharyngeal -- Pharyngeal- Pudding Cup -- Pharyngeal -- Pharyngeal- Honey Teaspoon -- Pharyngeal -- Pharyngeal- Honey Cup -- Pharyngeal -- Pharyngeal- Nectar Teaspoon -- Pharyngeal -- Pharyngeal- Nectar Cup WFL Pharyngeal -- Pharyngeal- Nectar Straw -- Pharyngeal -- Pharyngeal- Thin Teaspoon -- Pharyngeal -- Pharyngeal- Thin Cup WFL Pharyngeal -- Pharyngeal- Thin Straw WFL Pharyngeal -- Pharyngeal- Puree WFL Pharyngeal -- Pharyngeal- Mechanical Soft -- Pharyngeal -- Pharyngeal- Regular WFL Pharyngeal -- Pharyngeal- Multi-consistency -- Pharyngeal -- Pharyngeal- Pill WFL;Penetration/Aspiration during swallow Pharyngeal Material enters airway, remains ABOVE vocal cords then ejected out Pharyngeal Comment --  CHL IP CERVICAL ESOPHAGEAL PHASE 03/28/2018 Cervical Esophageal Phase Impaired Pudding Teaspoon -- Pudding Cup -- Honey Teaspoon -- Honey Cup -- Nectar Teaspoon -- Nectar Cup -- Nectar Straw -- Thin Teaspoon -- Thin Cup -- Thin Straw -- Puree -- Mechanical Soft -- Regular -- Multi-consistency -- Pill -- Cervical Esophageal Comment barium tablet taken with thin appeared to lodge at [place] and did not clear with 2 extra boluses of thin, pudding bolus transited tablet into stomach - pt without sensation  Macario Golds 03/28/2018, 9:38 AM  [name], MS [facility] SLP Acute Rehab Services Pager [phone] Office [phone]                  Scheduled Meds: . amitriptyline  25 mg Oral QHS  . Chlorhexidine Gluconate Cloth  6 each Topical  Q0600  . chlorpheniramine-HYDROcodone  5 mL Oral Q12H  . cholecalciferol  1,000 Units Oral Daily  . doxycycline  100 mg Oral Q12H  . enoxaparin (LOVENOX) injection  40 mg Subcutaneous Daily  . feeding supplement (ENSURE ENLIVE)  237 mL Oral TID BM  . gabapentin  600 mg Oral  QHS  . guaiFENesin  1,200 mg Oral BID  . losartan  25 mg Oral Daily  . methylPREDNISolone (SOLU-MEDROL) injection  60 mg Intravenous Q8H  . metoCLOPramide  5 mg Oral TID AC  . mometasone-formoterol  2 puff Inhalation BID  . multivitamin  1 tablet Oral Daily  . mupirocin ointment  1 application Nasal BID  . pantoprazole  40 mg Oral BID AC  . polyethylene glycol  17 g Oral Daily  . venlafaxine XR  75 mg Oral QHS   Continuous Infusions: . sodium chloride Stopped (03/26/18 0919)     LOS: 5 days     Georgette Shell, MD [facility]  If 7PM-7AM, please contact night-coverage [email or web address] Password [email or web address] 03/28/2018, 1:40 PM

## 2018-03-28 NOTE — Care Management Important Message (Signed)
Important Message  Patient Details  Name: Stacey [name] MRN: [id number] Date of Birth: 23-Apr-1941   Medicare Important Message Given:  Yes    Kerin Salen 03/28/2018, 11:53 AMImportant Message  Patient Details  Name: Stacey [name] MRN: [id number] Date of Birth: Nov 22, 1940   Medicare Important Message Given:  Yes    Kerin Salen 03/28/2018, 11:53 AM

## 2018-03-28 NOTE — Progress Notes (Addendum)
Physical Therapy Treatment Patient Details Name: Stacey [name] MRN: [id number] DOB: [birth year] Today's Date: 03/28/2018    History of Present Illness Pt is a 77 year old female with history of chronic lung disease/interstitial lung disease, rheumatoid arthritis and bronchitis and admitted for acute hypoxic respiratory failure secondary to asthma exacerbation    PT Comments    Pt in bed on 2 lts nasal sats 96%.  Assisted OOB to amb in hallway trial RA 50 feet x 2 one seated rest break.   SATURATION QUALIFICATIONS: (This note is used to comply with regulatory documentation for home oxygen)  Patient Saturations on Room Air at Rest = 94%  Patient Saturations on Room Air while Ambulating = 83%  Patient Saturations on 2 Liters of oxygen while Ambulating 50 feet= 90%  Please briefly explain why patient needs home oxygen:  Pt did require supplemental oxygen to achieve therapeutic level   Follow Up Recommendations  Home health PT;Supervision for mobility/OOB     Equipment Recommendations  Other (comment)(4WW rollator )    Recommendations for Other Services       Precautions / Restrictions Precautions Precautions: Fall Precaution Comments: monitor sats Restrictions Weight Bearing Restrictions: No    Mobility  Bed Mobility Overal bed mobility: Needs Assistance Bed Mobility: Supine to Sit;Sit to Supine     Supine to sit: Supervision Sit to supine: Supervision   General bed mobility comments: increased time. supervision for safety/lines  Transfers Overall transfer level: Needs assistance Equipment used: None Transfers: Sit to/from [employer] Transfers Sit to Stand: Supervision;Min guard Stand pivot transfers: Supervision;Min guard       General transfer comment: <25% VC's on safety with turns with O2 tubing  Ambulation/Gait Ambulation/Gait assistance: Min guard Gait Distance (Feet): 50 Feet(25 feet x 2 one sitting rest break) Assistive device: 4-wheeled  walker Gait Pattern/deviations: Step-through pattern;Decreased stride length Gait velocity: decreased    General Gait Details: amb with rollator such that pt could taking a seated rest break.  Amb on RA sats decreased to 82% with 2/4 dyspnea and mod coughing.  Reapplied 2 lts to achieve sats >90% avg HR 96.     Stairs             Wheelchair Mobility    Modified Rankin (Stroke Patients Only)       Balance Overall balance assessment: Needs assistance         Standing balance support: Single extremity supported Standing balance-Leahy Scale: Poor Standing balance comment: requiring UE support at this time                            Cognition Arousal/Alertness: Awake/alert Behavior During Therapy: WFL for tasks assessed/performed Overall Cognitive Status: Within Functional Limits for tasks assessed                                 General Comments: pleasant      Exercises      General Comments        Pertinent Vitals/Pain Pain Assessment: No/denies pain    Home Living                      Prior Function            PT Goals (current goals can now be found in the care plan section) Progress towards PT goals: Progressing toward goals  Frequency    Min 3X/week      PT Plan Current plan remains appropriate    Co-evaluation              AM-PAC PT "6 Clicks" Daily Activity  Outcome Measure  Difficulty turning over in bed (including adjusting bedclothes, sheets and blankets)?: None Difficulty moving from lying on back to sitting on the side of the bed? : A Little Difficulty sitting down on and standing up from a chair with arms (e.g., wheelchair, bedside commode, etc,.)?: A Little Help needed moving to and from a bed to chair (including a wheelchair)?: A Little Help needed walking in hospital room?: A Little Help needed climbing 3-5 steps with a railing? : A Little 6 Click Score: 19    End of Session  Equipment Utilized During Treatment: Oxygen Activity Tolerance: Patient limited by fatigue Patient left: in bed;with call bell/phone within reach;with bed alarm set Nurse Communication: Mobility status PT Visit Diagnosis: Difficulty in walking, not elsewhere classified (R26.2)     Time: 9476-5465 PT Time Calculation (min) (ACUTE ONLY): 24 min  Charges:  $Gait Training: 8-22 mins $Therapeutic Activity: 8-22 mins                     Rica Koyanagi  PTA Acute  Rehabilitation Services Pager      [phone] Office      [phone]

## 2018-03-28 NOTE — Progress Notes (Signed)
PCCM:  I spoke with Dr. Zigmund Daniel. They are planning discharge tomorrow.  Please see recommendations from Dr. Golden Pop last note 03/27/2018. Follow up planned with Dr. Chase Caller will see NP for hospital follow up. Please see d/c section.  Pulmonary will be available.  Please call with any questions.  Garner Nash, DO [facility] 03/28/2018 1:58 PM  Personal pager: [phone] If unanswered, please page CCM On-call: [phone]

## 2018-03-28 NOTE — Telephone Encounter (Signed)
MR please advise will you be ok with taking over patient care.   MW please advise on this, thank you.

## 2018-03-28 NOTE — Care Management Note (Signed)
Case Management Note  Patient Details  Name: Stacey [name] MRN: [id number] Date of Birth: [birth year]  Subjective/Objective: Noted on 02-may need @ home-will await qualifying 02 sats-Nsg will document 02 sats. [facility]-HHPT/OT ordered rep Santiago Glad already following-will deliver 3n1 to rm prior d/c.                   Action/Plan:d/c home w/HHC/dme   Expected Discharge Date:                  Expected Discharge Plan:  [place]  In-House Referral:     Discharge planning Services  CM Consult  Post Acute Care Choice:  Durable Medical Equipment(rw) Choice offered to:  Patient  DME Arranged:  3-N-1 DME Agency:  [place]:  PT, OT [place] Agency:  [place]  Status of Service:  In process, will continue to follow  If discussed at Long Length of Stay Meetings, dates discussed:    Additional Comments:  Dessa Phi, RN 03/28/2018, 3:15 PM

## 2018-03-28 NOTE — Progress Notes (Signed)
Modified Barium Swallow Progress Note  Patient Details  Name: Stacey [name] MRN: [id number] Date of Birth: 11-24-1940  Today's Date: 03/28/2018  Modified Barium Swallow completed.  Full report located under Chart Review in the Imaging Section.  Brief recommendations include the following:  Clinical Impression  Patient presents with functional oropharyngeal swallow ability.  Swallow was timely and strong without residuals.  She demonstrated trace flash laryngeal penetration of thin when taking barium tablet which is [facility].   barium tablet taken with thin appeared to lodge at [place] and did not clear with 2 extra boluses of thin, pudding bolus transited tablet into stomach - pt without sensation.  Recommend pt continue diet as tolerated incorporating strict esophageal compensation strategies.  Of note, pt coughed x1 before MBS and a moderate amount after MBS but not during - ? refluxing?     Swallow Evaluation Recommendations       SLP Diet Recommendations: Regular solids;Thin liquid   Liquid Administration via: Cup;Straw   Medication Administration: Whole meds with liquid(consume some puree or solid after to assure transited into stomach)   Supervision: Patient able to self feed   Compensations: Slow rate;Small sips/bites   Postural Changes: Remain semi-upright after after feeds/meals (Comment);Seated upright at 90 degrees   Oral Care Recommendations: Oral care BID      [name], MS [facility] SLP Acute Rehab Services Pager [phone] Office [phone]   Macario Golds 03/28/2018,9:37 AM

## 2018-03-28 NOTE — Progress Notes (Signed)
Occupational Therapy Treatment Patient Details Name: Stacey [name] MRN: [id number] DOB: 1941-04-12 Today's Date: 03/28/2018    History of present illness Pt is a 77 year old female with history of chronic lung disease/interstitial lung disease, rheumatoid arthritis and bronchitis and admitted for acute hypoxic respiratory failure secondary to asthma exacerbation   OT comments  Pt will likely need oxygen  Post DC due to desaturation with activity.  Educated pt and family on strategies for safety with oxygen cord with ADL's for fall prevention  Follow Up Recommendations  Home health OT;Supervision/Assistance - 24 hour    Equipment Recommendations  3 in 1 bedside commode    Recommendations for Other Services      Precautions / Restrictions Precautions Precautions: Fall Precaution Comments: monitor sats       Mobility Bed Mobility Overal bed mobility: Needs Assistance Bed Mobility: Supine to Sit;Sit to Supine     Supine to sit: Supervision Sit to supine: Supervision   General bed mobility comments: increased time. supervision for safety/lines  Transfers Overall transfer level: Needs assistance Equipment used: None Transfers: Sit to/from Stand Sit to Stand: Min guard Stand pivot transfers: Min guard       General transfer comment: close guard for safety. Increased time. Remained on [place] O2.  Oxygen removed for sit to stand activity and pt did desat to 78.  Oxygen reapplied. Sat increased to 94-96 in 30 seconds with deep breathing      Balance Overall balance assessment: Needs assistance         Standing balance support: Single extremity supported Standing balance-Leahy Scale: Poor Standing balance comment: requiring UE support at this time                           ADL either performed or assessed with clinical judgement   ADL       Grooming: Standing;Min guard                   Toilet Transfer: Minimal assistance;Stand-pivot;BSC;RW    Toileting- Clothing Manipulation and Hygiene: Min guard;Minimal assistance;Sit to/from stand         General ADL Comments: discussed BSC beside bed at night for safety and fall prevention.  Educated and provided handout regarding energy conservation.  Pt appreciative. Pt will benefit from [facility] to address these items in her home     Vision Patient Visual Report: No change from baseline            Cognition Arousal/Alertness: Awake/alert Behavior During Therapy: WFL for tasks assessed/performed Overall Cognitive Status: Within Functional Limits for tasks assessed                                                     Pertinent Vitals/ Pain       Pain Assessment: No/denies pain     Prior Functioning/Environment              Frequency  Min 2X/week        Progress Toward Goals  OT Goals(current goals can now be found in the care plan section)  Progress towards OT goals: Progressing toward goals     Plan Discharge plan remains appropriate       AM-PAC PT "6 Clicks" Daily Activity     Outcome Measure  Help from another person eating meals?: None Help from another person taking care of personal grooming?: A Little Help from another person toileting, which includes using toliet, bedpan, or urinal?: A Little Help from another person bathing (including washing, rinsing, drying)?: A Lot Help from another person to put on and taking off regular upper body clothing?: A Little Help from another person to put on and taking off regular lower body clothing?: A Lot 6 Click Score: 17    End of Session Equipment Utilized During Treatment: Gait belt;Rolling walker  OT Visit Diagnosis: Muscle weakness (generalized) (M62.81)   Activity Tolerance Patient tolerated treatment well   Patient Left in bed;with call bell/phone within reach   Nurse Communication Mobility status        Time: 1126-1221 OT Time Calculation (min): 55 min  Charges: OT  General Charges $OT Visit: 1 Visit OT Treatments $Self Care/Home Management : 38-52 mins  [place], [place] [phone]   Betsy Pries 03/28/2018, 12:32 PM

## 2018-03-28 NOTE — Telephone Encounter (Signed)
Yes. Will take over. Please note  she is seeing TP for po. Ensure she has followup with me in ILD clinic 4 weeks from when she sees TP  Thanks    SIGNATURE    Dr. Brand Males, M.D., F.C.C.P,  Pulmonary and Critical Care Medicine Staff Physician, [place] Director - Interstitial Lung Disease  Program  Pulmonary [place] at [place], [place], [address]  Pager: [phone], If no answer or between  15:00h - 7:00h: call [phone] Telephone: [phone]  4:01 PM 03/28/2018

## 2018-03-29 ENCOUNTER — Ambulatory Visit (HOSPITAL_COMMUNITY): Payer: Medicare Other | Attending: Internal Medicine

## 2018-03-29 LAB — CREATININE, SERUM
Creatinine, Ser: 0.57 mg/dL (ref 0.44–1.00)
GFR calc Af Amer: >60 mL/min (ref >60)
GFR calc non Af Amer: >60 mL/min (ref >60)

## 2018-03-29 LAB — MAGNESIUM: Magnesium: 2.1 mg/dL (ref 1.7–2.4)

## 2018-03-29 LAB — PHOSPHORUS: Phosphorus: 3 mg/dL (ref 2.5–4.6)

## 2018-03-29 LAB — GLUCOSE, CAPILLARY: Glucose-Capillary: 75 mg/dL (ref 70–99)

## 2018-03-29 MED ORDER — MUPIROCIN 2 % EX OINT: 1.0000 "application " | TOPICAL_OINTMENT | Freq: Two times a day (BID) | CUTANEOUS | 0 refills | Status: DC

## 2018-03-29 MED ORDER — ALPRAZOLAM 0.25 MG PO TABS: 0.2500 mg | ORAL_TABLET | Freq: Three times a day (TID) | ORAL | 0 refills | Status: DC | PRN

## 2018-03-29 MED ORDER — DOXYCYCLINE HYCLATE 100 MG PO TABS: 100.0000 mg | ORAL_TABLET | Freq: Two times a day (BID) | ORAL | 0 refills | Status: DC

## 2018-03-29 MED ORDER — HYDROCOD POLST-CPM POLST ER 10-8 MG/5ML PO SUER: 5.0000 mL | Freq: Two times a day (BID) | ORAL | 0 refills | Status: DC

## 2018-03-29 MED ORDER — MOMETASONE FURO-FORMOTEROL FUM 100-5 MCG/ACT IN AERO: 2.0000 | INHALATION_SPRAY | Freq: Two times a day (BID) | RESPIRATORY_TRACT | 0 refills | Status: DC

## 2018-03-29 MED ORDER — PREDNISONE 10 MG PO TABS: ORAL_TABLET | ORAL | 0 refills | Status: DC

## 2018-03-29 MED ORDER — ACETAMINOPHEN-CODEINE #3 300-30 MG PO TABS: 1.0000 | ORAL_TABLET | ORAL | 0 refills | Status: AC | PRN

## 2018-03-29 NOTE — Discharge Summary (Signed)
Physician Discharge Summary  Stacey [name] FBP:[id number] DOB: [birth year] DOA: 03/23/2018  PCP: Shon Baton, MD  Admit date: 03/23/2018 Discharge date: 03/29/2018  Admitted From: Home Disposition: Home Recommendations for Outpatient Follow-up:  1. Follow up with PCP in 1-2 weeks 2. Please obtain BMP/CBC in one week 3. Follow-up with PCCM patient already has an appointment 04/06/2018.  Home Health yes Equipment/Devices oxygen  Discharge Condition stable CODE STATUS: Full code Diet recommendation cardiac Brief/Interim Summary::77 year old female with rheumatoid arthritis ILD, asthma diagnosed 2000 treated currently at [facility] by rheumatologist Asthma diagnosed with allergies as a child-has been on prednisone for decades when previously evaluated by Dr. Noel Christmas on [place] 2014 September for rheumatoid issues  Recently seen at pulmonology office 03/21/2018-noted respiration worse-mMRC 3 category on flat grade and was on Protonix in addition to reflux diet  Recently admitted to hospital 01/2018 with dyspnea hypoxemia-at the time echo EF 55-60% with grade 1 diastolic dysfunction-Esophagramwas done and patient was placed by pulmonary on Reglan as OP  Hus[place] that [place] current issues [place]-on going to [facility] on 9/4 was difficult for the patient-at baseline she is ambulatory without oxygen or without assistive aid except when she was discharged back in [place] she is not winded and is able to walk flat distances without too much distress   Returns to ED today with further issues--insomnia X 2 days since being seen at pulmonology office visit-was prescribed 8 mg prednisone at that visit, Hycodan was changed to Tylenol 3 to help with work of breathing-another transitional care management appointment was made for the patient to determine her polypharmacy and the need for the same  ED Course:Patient given  continued patient given continuous neb in the emergency room and given magnesium-when I went down to see her she was off oxygen and when she ambulated for 5 steps she felt fearful and desatted to the high 80s with increased work of breathing    Discharge Diagnoses:  Principal Problem:   Acute respiratory failure with hypoxia ([place]) Active Problems:   Asthma   Interstitial lung disease due to connective tissue disease ([place])   Asthma exacerbation   Essential hypertension   Depression  1]Hypoxic respiratory failure with interstitial lung disease and rheumatoid lung and asthma -she admitted with complaints of dyspnea on exertion worsening shortness of breath and wheezing. She was treated with IV steroids.  Her oxygen saturation dropped to 79% on room air on ambulation.  Patient will require oxygen at home 24 hours a day.  She was initially treated with Vanco and cefepime and then switched to doxycycline 100 mg twice a day to finish the course of 7 days.  She was positive for MRSA PCR.CT of the chest showed no acute PE, chronic interstitial changes in the lungs, slight improvement in the right lower lobe airspace filling opacities consistent with improving infectious process.  Patient was seen by speech therapy since she complained of coughing after drinking liquids.  Speech therapy evaluation done was done and does not recommend any change in the diet.  Impression was patient is at very low risk for aspiration.  PFTs to be done as an outpatient.]HTNcontinue antihypertensives  3]depression/bipolar disorder Home medications.  4]GERD continue Protonix.   Discharge Instructions  Discharge Instructions    Diet - low sodium heart healthy   Complete by:  As directed    Increase activity slowly   Complete by:  As directed  Allergies as of 03/29/2018      Reactions   Penicillins Other (See Comments)   Reaction unknown occurred during childhood Has patient had a PCN reaction causing  immediate rash, facial/tongue/throat swelling, SOB or lightheadedness with hypotension: Unknown Has patient had a PCN reaction causing severe rash involving mucus membranes or skin necrosis: Unknown Has patient had a PCN reaction that required hospitalization: No Has patient had a PCN reaction occurring within the last 10 years: No If all of the above answers are "NO", then may proceed with Cephalosporin use. Unsur   Remicade [infliximab] Other (See Comments)   Reaction unknown   Sulfa Antibiotics Other (See Comments)   Reaction unknown   Sulfamethoxazole Other (See Comments)   Unsure of reaction As a child not sure      Medication List    STOP taking these medications   ibuprofen 600 MG tablet Commonly known as:  ADVIL,MOTRIN   methylPREDNISolone 8 MG tablet Commonly known as:  MEDROL   metoCLOPramide 5 MG tablet Commonly known as:  REGLAN     TAKE these medications   acetaminophen-codeine 300-30 MG tablet Commonly known as:  TYLENOL #3 Take 1 tablet by mouth every 4 (four) hours as needed for up to 5 days (cough).   albuterol (2.5 MG/3ML) 0.083% nebulizer solution Commonly known as:  PROVENTIL Inhale 2.5 mg into the lungs every 4 (four) hours as needed for wheezing or shortness of breath.   ALPRAZolam 0.25 MG tablet Commonly known as:  XANAX Take 1 tablet (0.25 mg total) by mouth 3 (three) times daily as needed for anxiety.   amitriptyline 25 MG tablet Commonly known as:  ELAVIL Take 25 mg by mouth at bedtime.   beta carotene w/minerals tablet Take 1 tablet by mouth daily.   chlorpheniramine-HYDROcodone 10-8 MG/5ML Suer Commonly known as:  TUSSIONEX Take 5 mLs by mouth every 12 (twelve) hours.   cholecalciferol 1000 units tablet Commonly known as:  VITAMIN D Take 1,000 Units by mouth daily.   dextromethorphan 30 MG/5ML liquid Commonly known as:  DELSYM Take 2.5 mLs (15 mg total) by mouth at bedtime.   doxycycline 100 MG tablet Commonly known as:   VIBRA-TABS Take 1 tablet (100 mg total) by mouth every 12 (twelve) hours.   gabapentin 300 MG capsule Commonly known as:  NEURONTIN Take 600 mg by mouth at bedtime.   guaiFENesin 600 MG 12 hr tablet Commonly known as:  MUCINEX Take 600 mg by mouth every 12 (twelve) hours as needed for cough or to loosen phlegm.   levalbuterol 0.63 MG/3ML nebulizer solution Commonly known as:  XOPENEX Inhale 0.63 mg into the lungs 2 (two) times daily as needed for wheezing or shortness of breath.   losartan 25 MG tablet Commonly known as:  COZAAR Take 25 mg by mouth daily.   mometasone-formoterol 100-5 MCG/ACT Aero Commonly known as:  DULERA Inhale 2 puffs into the lungs 2 (two) times daily.   mupirocin ointment 2 % Commonly known as:  BACTROBAN Place 1 application into the nose 2 (two) times daily.   ORENCIA 250 MG injection Generic drug:  abatacept Inject 250 mg into the vein every 30 (thirty) days.   pantoprazole 40 MG tablet Commonly known as:  PROTONIX Take 1 tablet (40 mg total) by mouth 2 (two) times daily before a meal. What changed:    when to take this  reasons to take this   polyethylene glycol packet Commonly known as:  MIRALAX / GLYCOLAX Take 17 g by  mouth daily. What changed:    when to take this  reasons to take this   predniSONE 10 MG tablet Commonly known as:  DELTASONE Start taking 50 mg daily for the first 3 days taper by 10 mg every 3 days to 10 mg daily till done.   traMADol 50 MG tablet Commonly known as:  ULTRAM Take 50 mg by mouth every 6 (six) hours as needed (For pain.).   venlafaxine XR 75 MG 24 hr capsule Commonly known as:  EFFEXOR-XR Take 75 mg by mouth at bedtime.            Durable Medical Equipment  (From admission, onward)         Start     Ordered   03/29/18 1003  DME Oxygen  Once    Question Answer Comment  Mode or (Route) Nasal cannula   Frequency Continuous (stationary and portable oxygen unit needed)   Oxygen conserving  device Yes   Oxygen delivery system Gas      03/29/18 1002   03/28/18 1515  For home use only DME 3 n 1  Once     03/28/18 1514         Follow-up Information    [facility]-Home Follow up.   Specialty:  [place] Why:  [facility] physical therapy/occupational therapy Contact information: [place] [address] [phone]        Melvenia Needles, NP Follow up on 04/06/2018.   Specialty:  Pulmonary Disease Why:  Appt at 9:00 AM. Please arrive at 8:45 for check in. Contact information: [address] [place] [address] [phone]        [facility] - Dme Follow up.   Why:  bedside commode Contact information: [place] [address] [phone]        Shon Baton, MD Follow up.   Specialty:  Internal Medicine Contact information: [place] [address] [phone]          Allergies  Allergen Reactions  . Penicillins Other (See Comments)    Reaction unknown occurred during childhood Has patient had a PCN reaction causing immediate rash, facial/tongue/throat swelling, SOB or lightheadedness with hypotension: Unknown Has patient had a PCN reaction causing severe rash involving mucus membranes or skin necrosis: Unknown Has patient had a PCN reaction that required hospitalization: No Has patient had a PCN reaction occurring within the last 10 years: No If all of the above answers are "NO", then may proceed with Cephalosporin use.  Unsur  . Remicade [Infliximab] Other (See Comments)    Reaction unknown  . Sulfa Antibiotics Other (See Comments)    Reaction unknown  . Sulfamethoxazole Other (See Comments)    Unsure of reaction As a child not sure     Consultations: PCCM Procedures/Studies: Dg Chest 2 View  Result Date: 03/21/2018 CLINICAL DATA:  Cough, congestion, shortness of Breath EXAM: CHEST - 2 VIEW COMPARISON:  CT 03/07/2018.  Plain film 01/25/2018.  FINDINGS: Chronic interstitial thickening noted in the lungs compatible with chronic interstitial lung disease/fibrosis, most pronounced in the apices and bases. No acute confluent airspace opacities. Heart is normal size. No effusions or acute bony abnormality. IMPRESSION: Chronic interstitial lung disease/fibrosis changes throughout the lungs. No acute findings. Electronically Signed   By: Rolm Baptise M.D.   On: 03/21/2018 14:48   Ct Angio Chest Pe W Or Wo Contrast  Result Date: 03/24/2018 CLINICAL DATA:  History  of interstitial lung disease, asthma. Respirations are worse. Hypoxia. EXAM: CT ANGIOGRAPHY CHEST WITH CONTRAST TECHNIQUE: Multidetector CT imaging of the chest was performed using the standard protocol during bolus administration of intravenous contrast. Multiplanar CT image reconstructions and MIPs were obtained to evaluate the vascular anatomy. CONTRAST:  164mL ISOVUE-370 IOPAMIDOL (ISOVUE-370) INJECTION 76% COMPARISON:  Chest x-ray 03/23/2018 FINDINGS: Cardiovascular: Coronary atherosclerosis. There is atherosclerotic calcification of the thoracic aorta not associated with aneurysm. The pulmonary arteries are well opacified by contrast bolus. No acute pulmonary embolus. Mediastinum/Nodes: Small hiatal hernia. No mediastinal, hilar, or axillary adenopathy. Lungs/Pleura: Study quality is degraded by patient motion artifact. There is biapical pleuroparenchymal scarring. Subpleural reticular changes and septal thickening are identified throughout the lungs. Ground-glass opacities in the RIGHT LOWER lobe have improved slightly, consistent with resolving inflammatory/infectious process. No suspicious pulmonary nodules. No pleural effusions or consolidations. No pulmonary edema. Upper Abdomen: Cholecystectomy. Musculoskeletal: Superior endplate fractures of T5, T6, and T10 are favored to be chronic. Review of the MIP images confirms the above findings. IMPRESSION: 1. Technically adequate exam showing no  acute pulmonary embolus. 2. Chronic interstitial changes in the lungs. 3. Slight improvement in RIGHT LOWER lobe airspace filling opacities, consistent with improving infectious process. 4.  Aortic atherosclerosis.  (ICD10-I70.0) Electronically Signed   By: Nolon Nations M.D.   On: 03/24/2018 15:40   Ct Chest High Resolution  Result Date: 03/07/2018 CLINICAL DATA:  77 year old female with worsening shortness of breath and cough since a hospitalization for 5 days in July 2019. EXAM: CT CHEST WITHOUT CONTRAST TECHNIQUE: Multidetector CT imaging of the chest was performed following the standard protocol without intravenous contrast. High resolution imaging of the lungs, as well as inspiratory and expiratory imaging, was performed. COMPARISON:  High-resolution chest CT 11/06/2016. FINDINGS: Cardiovascular: Heart size is normal. There is no significant pericardial fluid, thickening or pericardial calcification. There is aortic atherosclerosis, as well as atherosclerosis of the great vessels of the mediastinum and the coronary arteries, including calcified atherosclerotic plaque in the left main, left anterior descending and right coronary arteries. Calcifications of the aortic valve and mitral annulus. Aberrant right subclavian artery (normal anatomical variant) incidentally noted. Mediastinum/Nodes: No pathologically enlarged mediastinal or hilar lymph nodes. Please note that accurate exclusion of hilar adenopathy is limited on noncontrast CT scans. Esophagus is unremarkable in appearance. No axillary lymphadenopathy. Lungs/Pleura: High-resolution images demonstrate new patchy areas of predominantly ground-glass attenuation scattered throughout the lower lungs, most evident in the right lower lobe. As seen on the prior examination there are additional chronic patchy areas of ground-glass attenuation, septal thickening, mild cylindrical peripheral bronchiectasis and peripheral bronchiolectasis, which has no clear  craniocaudal gradient. No frank honeycombing is confidently identified. Inspiratory and expiratory imaging demonstrates mild air trapping, indicative of small airways disease. Chronic areas of bilateral apical pleuroparenchymal thickening, architectural distortion and calcification, similar to prior studies, most compatible with areas of chronic post infectious or inflammatory scarring. No suspicious appearing pulmonary nodules or masses. Upper Abdomen: Calcified lymph nodes in the hepatoduodenal ligament incidentally noted. Aortic atherosclerosis. Status post cholecystectomy. Esophagus is unremarkable in appearance. No axillary lymphadenopathy. Musculoskeletal: Chronic compression fractures of T5, T6 and T10 vertebral bodies with 10-20% loss of anterior vertebral body height at all 3 levels, similar to the prior examination. There are no aggressive appearing lytic or blastic lesions noted in the visualized portions of the skeleton. IMPRESSION: 1. There continues to be evidence of interstitial lung disease. 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 2. New patchy areas of ground-glass attenuation noted in the lower lungs, most evident in the right lower lobe, concerning for potential acute bronchopneumonia. 3. Aortic atherosclerosis, in addition to left main and 2 vessel coronary artery disease. Assessment for potential risk factor modification, dietary therapy or pharmacologic therapy may be warranted, if clinically indicated. 4. There are calcifications of the aortic valve and mitral annulus. Echocardiographic correlation for evaluation of potential valvular dysfunction may be warranted if clinically indicated. Aortic Atherosclerosis (ICD10-I70.0). Electronically Signed   By: Vinnie Langton M.D.   On: 03/07/2018 16:39   Dg Chest Port 1 View  Result Date: 03/23/2018 CLINICAL DATA:  Shortness of breath EXAM:  PORTABLE CHEST 1 VIEW COMPARISON:  March 21, 2018 FINDINGS: There is atelectatic change in the lung bases. There is no frank edema or consolidation. Heart is upper normal in size with pulmonary vascularity normal. No adenopathy. There is aortic atherosclerosis. No bone lesions. IMPRESSION: Bibasilar atelectasis. No edema or consolidation. Stable cardiac silhouette. There is aortic atherosclerosis. Aortic Atherosclerosis (ICD10-I70.0). Electronically Signed   By: Lowella Grip III M.D.   On: 03/23/2018 07:48   Dg Swallowing Func-speech Pathology  Result Date: 03/28/2018 Objective Swallowing Evaluation: Type of Study: Bedside Swallow Evaluation  Patient Details Name: JANIYHA MONTUFAR MRN: [id number] Date of Birth: Mar 01, 1941 Today's Date: 03/28/2018 Time: SLP Start Time (ACUTE ONLY): 0840 -SLP Stop Time (ACUTE ONLY): 0857 SLP Time Calculation (min) (ACUTE ONLY): 17 min Past Medical History: Past Medical History: Diagnosis Date . Abnormal finding of blood chemistry  . Asthma  . H/O measles  . H/O varicella  . Hypertension  . 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 Lichen sclerosus 62/69/48  Asymptomatic . Low iron  . Mitral valve prolapse  . Osteoarthritis  . Osteoporosis  . Post herpetic neuralgia  . Rheumatoid arthritis(714.0)  . Yeast infection  Past Surgical History: Past Surgical History: Procedure Laterality Date . CHOLECYSTECTOMY  2011 . WISDOM TOOTH EXTRACTION   HPI: Patient is a 77 y.o. female with PMH: RA, ILD, GERD, depression, asthma diagnosed in 2000 who had been recently seen by pulmonology office on 03/21/18 with noted worsening respiration, who returned to ED with further issues of insomnia x2 days. 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  Subjective: pleasant sitting upright in chair Assessment / Plan / Recommendation CHL IP CLINICAL  IMPRESSIONS 03/28/2018 Clinical Impression Patient presents with functional oropharyngeal swallow ability.  Swallow was timely and strong without residuals.  She demonstrated trace flash laryngeal penetration of thin when taking barium tablet which is [facility].   barium tablet taken with thin appeared to lodge at [place] and did not clear with 2 extra boluses of thin, pudding bolus transited tablet into stomach - pt without sensation.  Recommend pt continue diet as tolerated incorporating strict esophageal compensation strategies.  Of note, pt coughed x1 before MBS and a moderate amount after MBS but not during - ? refluxing?   SLP Visit Diagnosis Dysphagia, unspecified (R13.10) Attention and concentration deficit following -- Frontal lobe and executive function deficit following -- Impact on safety and function Mild aspiration risk   CHL IP TREATMENT RECOMMENDATION 03/28/2018 Treatment Recommendations Therapy as outlined in treatment plan below   Prognosis 03/28/2018 Prognosis for Safe Diet Advancement Good Barriers to Reach Goals -- Barriers/Prognosis Comment -- CHL IP DIET RECOMMENDATION 03/28/2018 SLP Diet Recommendations Regular solids;Thin liquid Liquid Administration  via Cup;Straw Medication Administration Whole meds with liquid Compensations Slow rate;Small sips/bites Postural Changes Remain semi-upright after after feeds/meals (Comment);Seated upright at 90 degrees   CHL IP OTHER RECOMMENDATIONS 03/28/2018 Recommended Consults -- Oral Care Recommendations Oral care BID Other Recommendations --   CHL IP FOLLOW UP RECOMMENDATIONS 03/26/2018 Follow up Recommendations None   CHL IP FREQUENCY AND DURATION 03/28/2018 Speech Therapy Frequency (ACUTE ONLY) min 1 x/week Treatment Duration 1 week      CHL IP ORAL PHASE 03/28/2018 Oral Phase WFL Oral - Pudding Teaspoon -- Oral - Pudding Cup -- Oral - Honey Teaspoon -- Oral - Honey Cup -- Oral - Nectar Teaspoon -- Oral - Nectar Cup WFL Oral - Nectar Straw -- Oral - Thin Teaspoon -- Oral  - Thin Cup WFL Oral - Thin Straw WFL Oral - Puree WFL Oral - Mech Soft -- Oral - Regular -- Oral - Multi-Consistency -- Oral - Pill WFL Oral Phase - Comment --  CHL IP PHARYNGEAL PHASE 03/28/2018 Pharyngeal Phase WFL Pharyngeal- Pudding Teaspoon -- Pharyngeal -- Pharyngeal- Pudding Cup -- Pharyngeal -- Pharyngeal- Honey Teaspoon -- Pharyngeal -- Pharyngeal- Honey Cup -- Pharyngeal -- Pharyngeal- Nectar Teaspoon -- Pharyngeal -- Pharyngeal- Nectar Cup WFL Pharyngeal -- Pharyngeal- Nectar Straw -- Pharyngeal -- Pharyngeal- Thin Teaspoon -- Pharyngeal -- Pharyngeal- Thin Cup WFL Pharyngeal -- Pharyngeal- Thin Straw WFL Pharyngeal -- Pharyngeal- Puree WFL Pharyngeal -- Pharyngeal- Mechanical Soft -- Pharyngeal -- Pharyngeal- Regular WFL Pharyngeal -- Pharyngeal- Multi-consistency -- Pharyngeal -- Pharyngeal- Pill WFL;Penetration/Aspiration during swallow Pharyngeal Material enters airway, remains ABOVE vocal cords then ejected out Pharyngeal Comment --  CHL IP CERVICAL ESOPHAGEAL PHASE 03/28/2018 Cervical Esophageal Phase Impaired Pudding Teaspoon -- Pudding Cup -- Honey Teaspoon -- Honey Cup -- Nectar Teaspoon -- Nectar Cup -- Nectar Straw -- Thin Teaspoon -- Thin Cup -- Thin Straw -- Puree -- Mechanical Soft -- Regular -- Multi-consistency -- Pill -- Cervical Esophageal Comment barium tablet taken with thin appeared to lodge at [place] and did not clear with 2 extra boluses of thin, pudding bolus transited tablet into stomach - pt without sensation  Macario Golds 03/28/2018, 9:38 AM  [name], MS [facility] SLP Acute Rehab Services Pager [phone] Office [phone]              (Echo, Carotid, EGD, Colonoscopy, ERCP)    Subjective:   Discharge Exam: Vitals:   03/28/18 2301 03/29/18 0502  BP: (!) 145/65 135/73  Pulse:  62  Resp: 18 20  Temp:  97.9 F (36.6 C)  SpO2:  100%   Vitals:   03/28/18 2038 03/28/18 2142 03/28/18 2301 03/29/18 0502  BP: (!) 166/75 (!) 168/88 (!) 145/65 135/73  Pulse: 72    62  Resp: 20  18 20   Temp: 97.9 F (36.6 C)   97.9 F (36.6 C)  TempSrc: Oral   Oral  SpO2: 98%   100%  Weight:      Height:        General: Pt is alert, awake, not in acute distress Cardiovascular: RRR, S1/S2 +, no rubs, no gallops Respiratory: CTA bilaterally, no wheezing, no rhonchi Abdominal: Soft, NT, ND, bowel sounds + Extremities: no edema, no cyanosis    The results of significant diagnostics from this hospitalization (including imaging, microbiology, ancillary and laboratory) are listed below for reference.     Microbiology: Recent Results (from the past 240 hour(s))  Respiratory Panel by PCR     Status: None   Collection Time: 03/24/18  4:02 PM  Result Value Ref Range Status   Adenovirus NOT DETECTED NOT DETECTED Final   Coronavirus 229E NOT DETECTED NOT DETECTED Final   Coronavirus HKU1 NOT DETECTED NOT DETECTED Final   Coronavirus NL63 NOT DETECTED NOT DETECTED Final   Coronavirus OC43 NOT DETECTED NOT DETECTED Final   Metapneumovirus NOT DETECTED NOT DETECTED Final   Rhinovirus / Enterovirus NOT DETECTED NOT DETECTED Final   Influenza A NOT DETECTED NOT DETECTED Final   Influenza B NOT DETECTED NOT DETECTED Final   Parainfluenza Virus 1 NOT DETECTED NOT DETECTED Final   Parainfluenza Virus 2 NOT DETECTED NOT DETECTED Final   Parainfluenza Virus 3 NOT DETECTED NOT DETECTED Final   Parainfluenza Virus 4 NOT DETECTED NOT DETECTED Final   Respiratory Syncytial Virus NOT DETECTED NOT DETECTED Final   Bordetella pertussis NOT DETECTED NOT DETECTED Final   Chlamydophila pneumoniae NOT DETECTED NOT DETECTED Final   Mycoplasma pneumoniae NOT DETECTED NOT DETECTED Final    Comment: Performed at [facility], [place] [address], [place], [place] [address]  MRSA PCR Screening     Status: Abnormal   Collection Time: 03/25/18  9:49 AM  Result Value Ref Range Status   MRSA by PCR POSITIVE (A) NEGATIVE Final    Comment:        The GeneXpert MRSA Assay (FDA approved  for NASAL specimens only), is one component of a comprehensive MRSA colonization surveillance program. It is not intended to diagnose MRSA infection nor to guide or monitor treatment for MRSA infections. RESULT CALLED TO, READ BACK BY AND VERIFIED WITH: [place] [id number] @ [place] Performed at [facility], [place] [address], [place], [place] [address]      Labs: BNP (last 3 results) Recent Labs    01/22/18 2357 01/23/18 1300 03/23/18 0732  BNP 100.6* 116.2* 35.0   Basic Metabolic Panel: Recent Labs  Lab 03/23/18 0731  03/24/18 0508 03/26/18 0507 03/27/18 0509 03/28/18 0443 03/29/18 0525  NA 141  --  139  --  142  --   --   K 4.2  --  4.2  --  4.5  --   --   CL 101  --  100  --  102  --   --   CO2 29  --  32  --  30  --   --   GLUCOSE 112*  --  91  --  130*  --   --   BUN 11  --  12  --  21  --   --   CREATININE 0.67   < > 0.60 0.62 0.58 0.59 0.57  CALCIUM 8.8*  --  8.6*  --  9.0  --   --   MG  --   --   --   --  2.2 2.1 2.1  PHOS  --   --   --   --  3.0 3.4 3.0   < > = values in this interval not displayed.   Liver Function Tests: Recent Labs  Lab 03/24/18 0508 03/27/18 0509  AST 18 21  ALT 14 16  ALKPHOS 50 46  BILITOT 0.4 0.7  PROT 6.0* 5.6*  ALBUMIN 3.0* 3.1*   No results for input(s): LIPASE, AMYLASE in the last 168 hours. No results for input(s): AMMONIA in the last 168 hours. CBC: Recent Labs  Lab 03/23/18 0731 03/23/18 1101 03/24/18 0508 03/27/18 0509  WBC 14.1* 13.8* 12.0* 8.4  NEUTROABS 5.6  --  6.4  --   HGB 12.5 12.5 11.5* 11.6*  HCT 39.0 39.6 36.1 36.2  MCV 94.4 95.0 94.0 93.5  PLT 375 367 324 328   Cardiac Enzymes: Recent Labs  Lab 03/23/18 0731  TROPONINI <0.03   BNP: Invalid input(s): POCBNP CBG: Recent Labs  Lab 03/25/18 0757 03/26/18 0751 03/27/18 0827 03/28/18 0741 03/29/18 0735  GLUCAP 123* 121* 130* 109* 75   D-Dimer No results for input(s): DDIMER in the last 72 hours. Hgb  A1c No results for input(s): HGBA1C in the last 72 hours. Lipid Profile No results for input(s): CHOL, HDL, LDLCALC, TRIG, CHOLHDL, LDLDIRECT in the last 72 hours. Thyroid function studies No results for input(s): TSH, T4TOTAL, T3FREE, THYROIDAB in the last 72 hours.  Invalid input(s): FREET3 Anemia work up No results for input(s): VITAMINB12, FOLATE, FERRITIN, TIBC, IRON, RETICCTPCT in the last 72 hours. Urinalysis    Component Value Date/Time   COLORURINE YELLOW 12/18/2009 0120   APPEARANCEUR CLEAR 12/18/2009 0120   LABSPEC 1.006 12/18/2009 0120   PHURINE 7.0 12/18/2009 0120   GLUCOSEU NEGATIVE 12/18/2009 0120   HGBUR NEGATIVE 12/18/2009 0120   BILIRUBINUR NEGATIVE 12/18/2009 0120   KETONESUR NEGATIVE 12/18/2009 0120   PROTEINUR NEGATIVE 12/18/2009 0120   UROBILINOGEN 0.2 12/18/2009 0120   NITRITE NEGATIVE 12/18/2009 0120   LEUKOCYTESUR TRACE (A) 12/18/2009 0120   Sepsis Labs Invalid input(s): PROCALCITONIN,  WBC,  LACTICIDVEN Microbiology Recent Results (from the past 240 hour(s))  Respiratory Panel by PCR     Status: None   Collection Time: 03/24/18  4:02 PM  Result Value Ref Range Status   Adenovirus NOT DETECTED NOT DETECTED Final   Coronavirus 229E NOT DETECTED NOT DETECTED Final   Coronavirus HKU1 NOT DETECTED NOT DETECTED Final   Coronavirus NL63 NOT DETECTED NOT DETECTED Final   Coronavirus OC43 NOT DETECTED NOT DETECTED Final   Metapneumovirus NOT DETECTED NOT DETECTED Final   Rhinovirus / Enterovirus NOT DETECTED NOT DETECTED Final   Influenza A NOT DETECTED NOT DETECTED Final   Influenza B NOT DETECTED NOT DETECTED Final   Parainfluenza Virus 1 NOT DETECTED NOT DETECTED Final   Parainfluenza Virus 2 NOT DETECTED NOT DETECTED Final   Parainfluenza Virus 3 NOT DETECTED NOT DETECTED Final   Parainfluenza Virus 4 NOT DETECTED NOT DETECTED Final   Respiratory Syncytial Virus NOT DETECTED NOT DETECTED Final   Bordetella pertussis NOT DETECTED NOT DETECTED Final    Chlamydophila pneumoniae NOT DETECTED NOT DETECTED Final   Mycoplasma pneumoniae NOT DETECTED NOT DETECTED Final    Comment: Performed at [facility], [place] [address], [place], [place] [address]  MRSA PCR Screening     Status: Abnormal   Collection Time: 03/25/18  9:49 AM  Result Value Ref Range Status   MRSA by PCR POSITIVE (A) NEGATIVE Final    Comment:        The GeneXpert MRSA Assay (FDA approved for NASAL specimens only), is one component of a comprehensive MRSA colonization surveillance program. It is not intended to diagnose MRSA infection nor to guide or monitor treatment for MRSA infections. RESULT CALLED TO, READ BACK BY AND VERIFIED WITH: [place] [id number] @ [place] Performed at [facility], [place] [address], [place], [place] [address]      Time coordinating discharge:  36 minutes  SIGNED:   Georgette Shell, MD  [facility] 03/29/2018, 10:03 AM Pager   If 7PM-7AM, please contact night-coverage [email or web address] Password [email or web address]

## 2018-03-29 NOTE — Telephone Encounter (Signed)
Called and spoke with pt's daughter Vicente Males at phone [phone]. Advised her of MR recommendations Scheduled f/u in ILD clinic for MR for 04/26/18 at 1:30pm Nothing further needed.

## 2018-03-29 NOTE — Telephone Encounter (Signed)
Pt's daughter Vicente Males is calling back.  [phone]     She also wants to know she still needs to be on the Albuterol, Xopenex nebulizer and also the dulera inhaler.

## 2018-03-29 NOTE — Progress Notes (Signed)
Occupational Therapy Treatment Patient Details Name: Stacey [name] MRN: [id number] DOB: Apr 17, 1941 Today's Date: 03/29/2018    History of present illness Pt is a 77 year old female with history of chronic lung disease/interstitial lung disease, rheumatoid arthritis and bronchitis and admitted for acute hypoxic respiratory failure secondary to asthma exacerbation   OT comments  Pt making good progress in OT.  She was not wearing 02 when I arrived.  Pursed lip breathing helped bring 02 above 90%.  Used sock aide, toilet and reinforced energy conservation  Follow Up Recommendations  Home health OT;Supervision/Assistance - 24 hour    Equipment Recommendations  3 in 1 bedside commode    Recommendations for Other Services      Precautions / Restrictions Precautions Precautions: Fall Precaution Comments: monitor sats Restrictions Weight Bearing Restrictions: No       Mobility Bed Mobility         Supine to sit: Supervision Sit to supine: Supervision   General bed mobility comments: HOB raised  Transfers   Equipment used: Rolling walker (2 wheeled)   Sit to Stand: Supervision         General transfer comment: Pt felt like she wanted to use RW today. She has this at home from her mother    Balance                                           ADL either performed or assessed with clinical judgement   ADL                       Lower Body Dressing: Minimal assistance;Sit to/from stand   Toilet Transfer: Supervision/safety;Ambulation;Comfort height toilet;RW   Toileting- Clothing Manipulation and Hygiene: Supervision/safety;Sit to/from stand         General ADL Comments: Cues for UE placement for sit to stand. Pt steady ambulating to bathroom.  Educated on and used sock aide with min A.  Pt doesn't have a reacher at home:  she can also use kitchen tongs:  demonstrated how she would do this with reacher.  Reinforced energy conservation  education     Vision       Perception     Praxis      Cognition Arousal/Alertness: Awake/alert Behavior During Therapy: WFL for tasks assessed/performed Overall Cognitive Status: Within Functional Limits for tasks assessed                                          Exercises     Shoulder Instructions       General Comments increased WOB.  Pt on RA, initial reading 88%, with pursed lip breathing increased to 92%, 96% in bathroom on toilet and 95% back in bed    Pertinent Vitals/ Pain       Pain Assessment: No/denies pain  Home Living                                          Prior Functioning/Environment              Frequency  Min 2X/week        Progress Toward Goals  OT Goals(current goals  can now be found in the care plan section)  Progress towards OT goals: Progressing toward goals     Plan Discharge plan remains appropriate    Co-evaluation                 AM-PAC PT "6 Clicks" Daily Activity     Outcome Measure   Help from another person eating meals?: None Help from another person taking care of personal grooming?: A Little Help from another person toileting, which includes using toliet, bedpan, or urinal?: A Little Help from another person bathing (including washing, rinsing, drying)?: A Little Help from another person to put on and taking off regular upper body clothing?: A Little Help from another person to put on and taking off regular lower body clothing?: A Little 6 Click Score: 19    End of Session    OT Visit Diagnosis: Muscle weakness (generalized) (M62.81)   Activity Tolerance Patient tolerated treatment well   Patient Left in bed;with call bell/phone within reach   Nurse Communication          Time: 4628-6381 OT Time Calculation (min): 18 min  Charges: OT General Charges $OT Visit: 1 Visit OT Treatments $Self Care/Home Management : 8-22 mins  Lesle [name], OTR/L Acute  Rehabilitation Services [phone] [facility] pager [phone] office 03/29/2018   [place] 03/29/2018, 9:52 AM

## 2018-03-29 NOTE — Care Management Note (Signed)
Case Management Note  Patient Details  Name: Stacey [name] MRN: [id number] Date of Birth: 08-19-1940  Subjective/Objective: Noted qualify for home 02-order placed. [facility] rep Santiago Glad aware of d/c home w/HHC, & home 02-will deliver 02 portable tank to rm prior d/c. No further CM needs.                  Action/Plan:dc home w/HHC/home 02   Expected Discharge Date:  03/29/18               Expected Discharge Plan:  [place]  In-House Referral:     Discharge planning Services  CM Consult  Post Acute Care Choice:  Durable Medical Equipment(rw) Choice offered to:  Patient  DME Arranged:  3-N-1, Oxygen DME Agency:  [place] Arranged:  PT, OT [place] Agency:  [place]  Status of Service:  Completed, signed off  If discussed at [place] of Stay Meetings, dates discussed:    Additional Comments:  Dessa Phi, RN 03/29/2018, 10:40 AM

## 2018-03-29 NOTE — Telephone Encounter (Signed)
dulera is 2 puff twice daily - is empiric on basis of asthma - and is maintenance  She just needs to be on albuterol as needed  No need for xopenex. On followup if fast HR is an issue we can do xopenex  Yesterday in phone message I indicated that after seeing tP she KIAH VANALSTINE should see mne in 3-4 weeks at ILD clinic

## 2018-03-29 NOTE — Telephone Encounter (Signed)
Called and spoke with pt's daughter, Vicente Males regarding pt's discharge from hospital today. Pt's daughter phone number is [phone]. Pt was seen by MR in hospital and is switching over to him today. Pt's daughter would like to have clarity on pt taking Albuterol, Xopenex nebulizer and also the dulera inhaler. Pt has appt with TP next week for hospital f/u but is requesting a f/u with MR also.  MR please advise.

## 2018-03-29 NOTE — Progress Notes (Signed)
Attempted to walk patient this am to see what her oxygen saturations were. She states she "prefers to wait a little bit until I wake up more." Will inform dayshift RN.

## 2018-03-29 NOTE — Telephone Encounter (Signed)
Attempted to call pt. I did not receive an answer. I have left a message for pt to return our call.  

## 2018-03-29 NOTE — Progress Notes (Signed)
SATURATION QUALIFICATIONS: (This note is used to comply with regulatory documentation for home oxygen)  Patient Saturations on Room Air at Rest = 91%  Patient Saturations on Room Air while Ambulating = 77%  Patient Saturations on 2lLiters of oxygen while Ambulating = 94%  Please briefly explain why patient needs home oxygen: Patient desaturated when amb on room air; came up to 94% with 2L oxygen

## 2018-03-30 DIAGNOSIS — R627 Adult failure to thrive: Secondary | ICD-10-CM | POA: Diagnosis not present

## 2018-03-30 DIAGNOSIS — I10 Essential (primary) hypertension: Secondary | ICD-10-CM | POA: Diagnosis not present

## 2018-03-30 DIAGNOSIS — M1990 Unspecified osteoarthritis, unspecified site: Secondary | ICD-10-CM | POA: Diagnosis not present

## 2018-03-30 DIAGNOSIS — F319 Bipolar disorder, unspecified: Secondary | ICD-10-CM | POA: Diagnosis not present

## 2018-03-30 DIAGNOSIS — Z9981 Dependence on supplemental oxygen: Secondary | ICD-10-CM | POA: Diagnosis not present

## 2018-03-30 DIAGNOSIS — J849 Interstitial pulmonary disease, unspecified: Secondary | ICD-10-CM | POA: Diagnosis not present

## 2018-03-30 DIAGNOSIS — M0510 Rheumatoid lung disease with rheumatoid arthritis of unspecified site: Secondary | ICD-10-CM | POA: Diagnosis not present

## 2018-03-30 DIAGNOSIS — J9601 Acute respiratory failure with hypoxia: Secondary | ICD-10-CM | POA: Diagnosis not present

## 2018-03-30 DIAGNOSIS — M810 Age-related osteoporosis without current pathological fracture: Secondary | ICD-10-CM | POA: Diagnosis not present

## 2018-03-30 DIAGNOSIS — J45901 Unspecified asthma with (acute) exacerbation: Secondary | ICD-10-CM | POA: Diagnosis not present

## 2018-03-30 DIAGNOSIS — R1314 Dysphagia, pharyngoesophageal phase: Secondary | ICD-10-CM | POA: Diagnosis not present

## 2018-04-01 ENCOUNTER — Other Ambulatory Visit: Payer: Self-pay

## 2018-04-01 DIAGNOSIS — M0510 Rheumatoid lung disease with rheumatoid arthritis of unspecified site: Secondary | ICD-10-CM | POA: Diagnosis not present

## 2018-04-01 DIAGNOSIS — I10 Essential (primary) hypertension: Secondary | ICD-10-CM | POA: Diagnosis not present

## 2018-04-01 DIAGNOSIS — R1314 Dysphagia, pharyngoesophageal phase: Secondary | ICD-10-CM | POA: Diagnosis not present

## 2018-04-01 DIAGNOSIS — J45901 Unspecified asthma with (acute) exacerbation: Secondary | ICD-10-CM | POA: Diagnosis not present

## 2018-04-01 DIAGNOSIS — J849 Interstitial pulmonary disease, unspecified: Secondary | ICD-10-CM | POA: Diagnosis not present

## 2018-04-01 DIAGNOSIS — J9601 Acute respiratory failure with hypoxia: Secondary | ICD-10-CM | POA: Diagnosis not present

## 2018-04-01 NOTE — Patient Outreach (Signed)
[facility]) Care Management  04/01/2018  Stacey [name] 01/04/1941 [id number]  EMMI: General discharge red alert Referral date: 04/01/18 Referral reason: unfilled prescriptions: yes Insurance: medicare Day # 1  Telephone call to patient regarding EMMI general discharge red alert. HIPAA verified with patient. Explained reason for call.  Patient states she is not having any problems with her prescriptions. She states she has all of her medications and takes them as prescribed. Patient states she has a follow up appointment with the pulmonologist on 04/06/18.  Patient states her primary MD is aware of her admission.  Patient states she has transportation to her appointments.  She denies having any new symptoms. RNCM reviewed respiratory failure symptoms with patient.  Patient verbalized understanding of call 911 for severe respiratory symptoms. Knows to call doctor for non emergent symptoms. Patient reports she has home oxygen and home health services with [facility]. Patient states the home health nurse is scheduled to see her today.  Patient denies any further needs at this time.  She denies any need for [facility] care management services.   RNCM advised patient to notify MD of any changes in condition prior to scheduled appointment. RNCM provided contact name and number: [phone] or main office number [phone] and 24 hour nurse advise line [phone].  RNCM verified patient aware of 911 services for urgent/ emergent needs.,   PLAN:  RNCM will close patient due to patient being assessed and having no further needs.  RNCM will send patient [facility] care management brochure/ magnet RNCM will send patients primary MD closure letter.   Quinn Plowman RN,BSN,CCM [facility] Telephonic  [phone]

## 2018-04-05 DIAGNOSIS — J45901 Unspecified asthma with (acute) exacerbation: Secondary | ICD-10-CM | POA: Diagnosis not present

## 2018-04-05 DIAGNOSIS — J849 Interstitial pulmonary disease, unspecified: Secondary | ICD-10-CM | POA: Diagnosis not present

## 2018-04-05 DIAGNOSIS — J9601 Acute respiratory failure with hypoxia: Secondary | ICD-10-CM | POA: Diagnosis not present

## 2018-04-05 DIAGNOSIS — M0510 Rheumatoid lung disease with rheumatoid arthritis of unspecified site: Secondary | ICD-10-CM | POA: Diagnosis not present

## 2018-04-05 DIAGNOSIS — I10 Essential (primary) hypertension: Secondary | ICD-10-CM | POA: Diagnosis not present

## 2018-04-05 DIAGNOSIS — R1314 Dysphagia, pharyngoesophageal phase: Secondary | ICD-10-CM | POA: Diagnosis not present

## 2018-04-06 ENCOUNTER — Encounter: Payer: Self-pay | Admitting: Adult Health

## 2018-04-06 ENCOUNTER — Ambulatory Visit (INDEPENDENT_AMBULATORY_CARE_PROVIDER_SITE_OTHER): Payer: Medicare Other | Admitting: Adult Health

## 2018-04-06 DIAGNOSIS — J45909 Unspecified asthma, uncomplicated: Secondary | ICD-10-CM | POA: Diagnosis not present

## 2018-04-06 DIAGNOSIS — K219 Gastro-esophageal reflux disease without esophagitis: Secondary | ICD-10-CM | POA: Diagnosis not present

## 2018-04-06 DIAGNOSIS — J9611 Chronic respiratory failure with hypoxia: Secondary | ICD-10-CM | POA: Diagnosis not present

## 2018-04-06 DIAGNOSIS — M358 Other specified systemic involvement of connective tissue: Secondary | ICD-10-CM | POA: Diagnosis not present

## 2018-04-06 DIAGNOSIS — J8489 Other specified interstitial pulmonary diseases: Secondary | ICD-10-CM | POA: Diagnosis not present

## 2018-04-06 MED ORDER — ALBUTEROL SULFATE HFA 108 (90 BASE) MCG/ACT IN AERS: 2.0000 | INHALATION_SPRAY | RESPIRATORY_TRACT | 3 refills | Status: DC | PRN

## 2018-04-06 MED ORDER — ALBUTEROL SULFATE (2.5 MG/3ML) 0.083% IN NEBU: 2.5000 mg | INHALATION_SOLUTION | RESPIRATORY_TRACT | 3 refills | Status: DC | PRN

## 2018-04-06 NOTE — Progress Notes (Signed)
@Patient  ID: Stacey [name], female    DOB: [birth year], 77 y.o.   MRN: [id number]  Chief Complaint  Patient presents with  . Hospitalization Follow-up    Asthma     Referring provider: Shon Baton, MD  HPI: 77 year old female never smoker followed for Asthma and Allergic rhinitis and rheumatoid lung disease  Medical history is significant for rheumatoid arthritis on Orencia -chronic steroids    TEST  . The suspected pulmonary nodule in the right lung apex is actually  a calcified posterior pleural plaque and is benign in appearance.  2. Nodularity in the lateral aspect of the right upper lobe is felt  to be inflammatory in origin. However, there is a 9 mm nodule in  this area.  - PFT's 07/17/13 VC 74% with no obst and dlco 69 correctrs to 85% - CT 10/16/13 1. Stable chronic lung disease with scattered subpleural scarring and asymmetric right upper lobe subpleural nodularity, likely postinflammatory (prior granulomatous disease). 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 2. No suspicious pulmonary nodule or acute process demonstrated. - CT 10/21/2015 Mild growth of irregular 1.0 cm subpleural left upper lobe pulmonary nodule since 10/16/2013, cannot exclude an indolent primary bronchogenic carcinoma.  -  PET 11/01/2015 > neg with ild c/w RA - PFT's  02/07/2016  VC  1.99 (65%)   study with DLCO  67/68 % corrects to 89 % for alv volume   - HRCT rec p 10 days of levaquin 10/23/2016  - HRCT 11/06/2016  1. The appearance of the chest remains compatible with interstitial lung disease. 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 No significant progression of findings compared to the prior study. 2. Chronic areas of pleural-parenchymal thickening/calcification again noted throughout the apical portions of the lungs bilaterally, most compatible with areas of chronic post infectious or inflammatory  scarring. - PFT's  01/11/2017   VC 1.91 (64%) s obst or curvature   p nothing prior to study with DLCO  61/59c % corrects to 74  % for alv volume   - PFT's  01/10/2018  VC  1.88 (63%) s significant obst p nothing prior to study with DLCO  59 % corrects to 81  % for alv volume    02/2018  FENO 30, medrol taper  - Allergy profile 03/21/18  >  Eos 1.1  /  IgE  5 rast neg 03/2018 Swallow study >mild aspiration 2D echo July 2019 EF 55 to 88%, grade 1 diastolic dysfunction. Barium swallow 01/2018 >+reflux in supine position , mild narrowing in distal esophagus  04/06/2018 Follow up : Asthma ,  RA-ILD , post hospital follow-up Patient presents for a post hospital follow-up.  Patient was admitted earlier this month for a asthma exacerbation.  She was treated with IV steroids and transitioned on a steroid taper.  She was started on IV antibiotics and discharged on doxycycline.  During admission CT chest showed no PE.  Chronic interstitial changes in the lungs.  Slight improvement in the right lower lobe airspace opacities consistent with an improving infectious process.  She was seen by speech therapy and had a swallow evaluation felt to be low risk for aspiration.  Previous barium swallow in July did show positive reflux in supine position and mild narrowing in the distal esophagus.  She has a GI referral pending.   Patient was started on oxygen for persistent desaturations.  Since discharge patient says she is feeling better. Feels  the oxygen is really helping .  She is accompanied by her husband and son. She is receiving PT at home.  We discussed pulmonary rehab when she is done with this. She says that cough is at baseline.  She remains on Dulera twice daily.  Denies any increased wheezing. She is followed by rheumatology at [facility].  She is on Orencia.  She says that they are considering changing her therapy.  She is on chronic steroids with Medrol 4 mg.  We discussed when she is finished with her  prednisone taper she is to resume Medrol daily dosing.        Allergies  Allergen Reactions  . Penicillins Other (See Comments)    Reaction unknown occurred during childhood Has patient had a PCN reaction causing immediate rash, facial/tongue/throat swelling, SOB or lightheadedness with hypotension: Unknown Has patient had a PCN reaction causing severe rash involving mucus membranes or skin necrosis: Unknown Has patient had a PCN reaction that required hospitalization: No Has patient had a PCN reaction occurring within the last 10 years: No If all of the above answers are "NO", then may proceed with Cephalosporin use.  Unsur  . Remicade [Infliximab] Other (See Comments)    Reaction unknown  . Sulfa Antibiotics Other (See Comments)    Reaction unknown  . Sulfamethoxazole Other (See Comments)    Unsure of reaction As a child not sure     Immunization History  Administered Date(s) Administered  . Influenza Split 04/12/2013, 04/12/2014, 03/14/2015, 04/23/2016  . Influenza, High Dose Seasonal PF 04/12/2017, 03/24/2018  . Influenza-Unspecified 04/03/2013, 04/22/2017  . Pneumococcal Conjugate-13 04/03/2013, 09/28/2014  . Pneumococcal Polysaccharide-23 07/13/2012, 07/24/2013  . Tdap 12/10/2017    Past Medical History:  Diagnosis Date  . Abnormal finding of blood chemistry   . Asthma   . H/O measles   . H/O varicella   . Hypertension   . 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 Lichen sclerosus 35/57/32   Asymptomatic  . Low iron   . Mitral valve prolapse   . Osteoarthritis   . Osteoporosis   . Post herpetic neuralgia   . Rheumatoid arthritis(714.0)   . Yeast infection     Tobacco History: Social History   Tobacco Use  Smoking Status Never Smoker  Smokeless Tobacco Never Used   Counseling given: Not Answered   Outpatient Medications Prior to Visit  Medication Sig Dispense Refill  . abatacept (ORENCIA) 250 MG injection Inject 250 mg into the vein every 30 (thirty)  days.     Marland Kitchen albuterol (PROVENTIL) (2.5 MG/3ML) 0.083% nebulizer solution Inhale 2.5 mg into the lungs every 4 (four) hours as needed for wheezing or shortness of breath.     Marland Kitchen amitriptyline (ELAVIL) 25 MG tablet Take 25 mg by mouth at bedtime.  0  . beta carotene w/minerals (OCUVITE) tablet Take 1 tablet by mouth daily.    . chlorpheniramine-HYDROcodone (TUSSIONEX) 10-8 MG/5ML SUER Take 5 mLs by mouth every 12 (twelve) hours. 140 mL 0  . cholecalciferol (VITAMIN D) 1000 UNITS tablet Take 1,000 Units by mouth daily.    Marland Kitchen dextromethorphan (DELSYM) 30 MG/5ML liquid Take 2.5 mLs (15 mg total) by mouth at bedtime. 89 mL 0  . gabapentin (NEURONTIN) 300 MG capsule Take 600 mg by mouth at bedtime.    Marland Kitchen guaiFENesin (MUCINEX) 600 MG 12 hr tablet Take 600 mg by mouth every 12 (twelve) hours as needed for cough or to loosen phlegm.     Marland Kitchen losartan (COZAAR) 25  MG tablet Take 25 mg by mouth daily.  11  . mometasone-formoterol (DULERA) 100-5 MCG/ACT AERO Inhale 2 puffs into the lungs 2 (two) times daily. 13 g 0  . pantoprazole (PROTONIX) 40 MG tablet Take 1 tablet (40 mg total) by mouth 2 (two) times daily before a meal. (Patient taking differently: Take 40 mg by mouth 2 (two) times daily as needed (For heartburn or acid reflux.). ) 60 tablet 0  . polyethylene glycol (MIRALAX / GLYCOLAX) packet Take 17 g by mouth daily. (Patient taking differently: Take 17 g by mouth daily as needed (For constipation.). ) 14 each 0  . predniSONE (DELTASONE) 10 MG tablet Start taking 50 mg daily for the first 3 days taper by 10 mg every 3 days to 10 mg daily till done. 40 tablet 0  . venlafaxine XR (EFFEXOR-XR) 75 MG 24 hr capsule Take 75 mg by mouth at bedtime.     . ALPRAZolam (XANAX) 0.25 MG tablet Take 1 tablet (0.25 mg total) by mouth 3 (three) times daily as needed for anxiety. (Patient not taking: Reported on 04/06/2018) 30 tablet 0  . doxycycline (VIBRA-TABS) 100 MG tablet Take 1 tablet (100 mg total) by mouth every 12  (twelve) hours. (Patient not taking: Reported on 04/06/2018) 6 tablet 0  . levalbuterol (XOPENEX) 0.63 MG/3ML nebulizer solution Inhale 0.63 mg into the lungs 2 (two) times daily as needed for wheezing or shortness of breath.     . mupirocin ointment (BACTROBAN) 2 % Place 1 application into the nose 2 (two) times daily. (Patient not taking: Reported on 04/06/2018) 22 g 0  . traMADol (ULTRAM) 50 MG tablet Take 50 mg by mouth every 6 (six) hours as needed (For pain.).      No facility-administered medications prior to visit.      Review of Systems  Constitutional:   No  weight loss, night sweats,  Fevers, chills, + fatigue, or  lassitude.  HEENT:   No headaches,  Difficulty swallowing,  Tooth/dental problems, or  Sore throat,                No sneezing, itching, ear ache, nasal congestion, post nasal drip,   CV:  No chest pain,  Orthopnea, PND, swelling in lower extremities, anasarca, dizziness, palpitations, syncope.   GI  No heartburn, indigestion, abdominal pain, nausea, vomiting, diarrhea, change in bowel habits, loss of appetite, bloody stools.   Resp: .  No chest wall deformity  Skin: no rash or lesions.  GU: no dysuria, change in color of urine, no urgency or frequency.  No flank pain, no hematuria   MS: +RA no flare of joint  pain or swelling.      Physical Exam  BP 120/74 (BP Location: Right Arm, Patient Position: Sitting)   Pulse 72   Ht 5\' 6"  (1.676 m)   Wt 138 lb 6.4 oz (62.8 kg)   SpO2 98%   BMI 22.34 kg/m   GEN: A/Ox3; pleasant , NAD, thin elderly  Frail on O2    HEENT:  Cottle/AT,  EACs-clear, TMs-wnl, NOSE-clear, THROAT-clear, no lesions, no postnasal drip or exudate noted.   NECK:  Supple w/ fair ROM; no JVD; normal carotid impulses w/o bruits; no thyromegaly or nodules palpated; no lymphadenopathy.    RESP  BB crackles , . no accessory muscle use, no dullness to percussion  CARD:  RRR, no m/r/g, no peripheral edema, pulses intact, no cyanosis or  clubbing.  GI:   Soft & nt; nml bowel  sounds; no organomegaly or masses detected.   Musco: Warm bil, no deformities or joint swelling noted.   Neuro: alert, no focal deficits noted.    Skin: Warm, no lesions or rashes    Lab Results:  CBC   BNP  Imaging: Dg Chest 2 View  Result Date: 03/21/2018 CLINICAL DATA:  Cough, congestion, shortness of Breath EXAM: CHEST - 2 VIEW COMPARISON:  CT 03/07/2018.  Plain film 01/25/2018. FINDINGS: Chronic interstitial thickening noted in the lungs compatible with chronic interstitial lung disease/fibrosis, most pronounced in the apices and bases. No acute confluent airspace opacities. Heart is normal size. No effusions or acute bony abnormality. IMPRESSION: Chronic interstitial lung disease/fibrosis changes throughout the lungs. No acute findings. Electronically Signed   By: Rolm Baptise M.D.   On: 03/21/2018 14:48   Ct Angio Chest Pe W Or Wo Contrast  Result Date: 03/24/2018 CLINICAL DATA:  History of interstitial lung disease, asthma. Respirations are worse. Hypoxia. EXAM: CT ANGIOGRAPHY CHEST WITH CONTRAST TECHNIQUE: Multidetector CT imaging of the chest was performed using the standard protocol during bolus administration of intravenous contrast. Multiplanar CT image reconstructions and MIPs were obtained to evaluate the vascular anatomy. CONTRAST:  153mL ISOVUE-370 IOPAMIDOL (ISOVUE-370) INJECTION 76% COMPARISON:  Chest x-ray 03/23/2018 FINDINGS: Cardiovascular: Coronary atherosclerosis. There is atherosclerotic calcification of the thoracic aorta not associated with aneurysm. The pulmonary arteries are well opacified by contrast bolus. No acute pulmonary embolus. Mediastinum/Nodes: Small hiatal hernia. No mediastinal, hilar, or axillary adenopathy. Lungs/Pleura: Study quality is degraded by patient motion artifact. There is biapical pleuroparenchymal scarring. Subpleural reticular changes and septal thickening are identified throughout the lungs.  Ground-glass opacities in the RIGHT LOWER lobe have improved slightly, consistent with resolving inflammatory/infectious process. No suspicious pulmonary nodules. No pleural effusions or consolidations. No pulmonary edema. Upper Abdomen: Cholecystectomy. Musculoskeletal: Superior endplate fractures of T5, T6, and T10 are favored to be chronic. Review of the MIP images confirms the above findings. IMPRESSION: 1. Technically adequate exam showing no acute pulmonary embolus. 2. Chronic interstitial changes in the lungs. 3. Slight improvement in RIGHT LOWER lobe airspace filling opacities, consistent with improving infectious process. 4.  Aortic atherosclerosis.  (ICD10-I70.0) Electronically Signed   By: Nolon Nations M.D.   On: 03/24/2018 15:40   Ct Chest High Resolution  Result Date: 03/07/2018 CLINICAL DATA:  77 year old female with worsening shortness of breath and cough since a hospitalization for 5 days in July 2019. EXAM: CT CHEST WITHOUT CONTRAST TECHNIQUE: Multidetector CT imaging of the chest was performed following the standard protocol without intravenous contrast. High resolution imaging of the lungs, as well as inspiratory and expiratory imaging, was performed. COMPARISON:  High-resolution chest CT 11/06/2016. FINDINGS: Cardiovascular: Heart size is normal. There is no significant pericardial fluid, thickening or pericardial calcification. There is aortic atherosclerosis, as well as atherosclerosis of the great vessels of the mediastinum and the coronary arteries, including calcified atherosclerotic plaque in the left main, left anterior descending and right coronary arteries. Calcifications of the aortic valve and mitral annulus. Aberrant right subclavian artery (normal anatomical variant) incidentally noted. Mediastinum/Nodes: No pathologically enlarged mediastinal or hilar lymph nodes. Please note that accurate exclusion of hilar adenopathy is limited on noncontrast CT scans. Esophagus is  unremarkable in appearance. No axillary lymphadenopathy. Lungs/Pleura: High-resolution images demonstrate new patchy areas of predominantly ground-glass attenuation scattered throughout the lower lungs, most evident in the right lower lobe. As seen on the prior examination there are additional chronic patchy areas of ground-glass attenuation, septal thickening, mild cylindrical peripheral  bronchiectasis and peripheral bronchiolectasis, which has no clear craniocaudal gradient. No frank honeycombing is confidently identified. Inspiratory and expiratory imaging demonstrates mild air trapping, indicative of small airways disease. Chronic areas of bilateral apical pleuroparenchymal thickening, architectural distortion and calcification, similar to prior studies, most compatible with areas of chronic post infectious or inflammatory scarring. No suspicious appearing pulmonary nodules or masses. Upper Abdomen: Calcified lymph nodes in the hepatoduodenal ligament incidentally noted. Aortic atherosclerosis. Status post cholecystectomy. Esophagus is unremarkable in appearance. No axillary lymphadenopathy. Musculoskeletal: Chronic compression fractures of T5, T6 and T10 vertebral bodies with 10-20% loss of anterior vertebral body height at all 3 levels, similar to the prior examination. There are no aggressive appearing lytic or blastic lesions noted in the visualized portions of the skeleton. IMPRESSION: 1. There continues to be evidence of interstitial lung disease. 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 2. New patchy areas of ground-glass attenuation noted in the lower lungs, most evident in the right lower lobe, concerning for potential acute bronchopneumonia. 3. Aortic atherosclerosis, in addition to left main and 2 vessel coronary artery disease. Assessment for potential risk factor modification, dietary therapy or  pharmacologic therapy may be warranted, if clinically indicated. 4. There are calcifications of the aortic valve and mitral annulus. Echocardiographic correlation for evaluation of potential valvular dysfunction may be warranted if clinically indicated. Aortic Atherosclerosis (ICD10-I70.0). Electronically Signed   By: Vinnie Langton M.D.   On: 03/07/2018 16:39   Dg Chest Port 1 View  Result Date: 03/23/2018 CLINICAL DATA:  Shortness of breath EXAM: PORTABLE CHEST 1 VIEW COMPARISON:  March 21, 2018 FINDINGS: There is atelectatic change in the lung bases. There is no frank edema or consolidation. Heart is upper normal in size with pulmonary vascularity normal. No adenopathy. There is aortic atherosclerosis. No bone lesions. IMPRESSION: Bibasilar atelectasis. No edema or consolidation. Stable cardiac silhouette. There is aortic atherosclerosis. Aortic Atherosclerosis (ICD10-I70.0). Electronically Signed   By: Lowella Grip III M.D.   On: 03/23/2018 07:48   Dg Swallowing Func-speech Pathology  Result Date: 03/28/2018 Objective Swallowing Evaluation: Type of Study: Bedside Swallow Evaluation  Patient Details Name: Stacey [name] MRN: [id number] Date of Birth: [birth year] Today's Date: 03/28/2018 Time: SLP Start Time (ACUTE ONLY): 0840 -SLP Stop Time (ACUTE ONLY): 0857 SLP Time Calculation (min) (ACUTE ONLY): 17 min Past Medical History: Past Medical History: Diagnosis Date . Abnormal finding of blood chemistry  . Asthma  . H/O measles  . H/O varicella  . Hypertension  . 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 Lichen sclerosus 22/97/98  Asymptomatic . Low iron  . Mitral valve prolapse  . Osteoarthritis  . Osteoporosis  . Post herpetic neuralgia  . Rheumatoid arthritis(714.0)  . Yeast infection  Past Surgical History: Past Surgical History: Procedure Laterality Date . CHOLECYSTECTOMY  2011 . WISDOM TOOTH EXTRACTION   HPI: Patient is a 77 y.o. female with PMH: RA, ILD, GERD, depression, asthma diagnosed in 2000 who  had been recently seen by pulmonology office on 03/21/18 with noted worsening respiration, who returned to ED with further issues of insomnia x2 days. 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  Subjective: pleasant sitting upright in chair Assessment / Plan / Recommendation CHL IP CLINICAL IMPRESSIONS 03/28/2018 Clinical Impression Patient presents with functional oropharyngeal swallow ability.  Swallow was timely and strong without residuals.  She  demonstrated trace flash laryngeal penetration of thin when taking barium tablet which is [facility].   barium tablet taken with thin appeared to lodge at [place] and did not clear with 2 extra boluses of thin, pudding bolus transited tablet into stomach - pt without sensation.  Recommend pt continue diet as tolerated incorporating strict esophageal compensation strategies.  Of note, pt coughed x1 before MBS and a moderate amount after MBS but not during - ? refluxing?   SLP Visit Diagnosis Dysphagia, unspecified (R13.10) Attention and concentration deficit following -- Frontal lobe and executive function deficit following -- Impact on safety and function Mild aspiration risk   CHL IP TREATMENT RECOMMENDATION 03/28/2018 Treatment Recommendations Therapy as outlined in treatment plan below   Prognosis 03/28/2018 Prognosis for Safe Diet Advancement Good Barriers to Reach Goals -- Barriers/Prognosis Comment -- CHL IP DIET RECOMMENDATION 03/28/2018 SLP Diet Recommendations Regular solids;Thin liquid Liquid Administration via Cup;Straw Medication Administration Whole meds with liquid Compensations Slow rate;Small sips/bites Postural Changes Remain semi-upright after after feeds/meals (Comment);Seated upright at 90 degrees   CHL IP OTHER RECOMMENDATIONS 03/28/2018 Recommended Consults -- Oral Care Recommendations Oral care BID Other  Recommendations --   CHL IP FOLLOW UP RECOMMENDATIONS 03/26/2018 Follow up Recommendations None   CHL IP FREQUENCY AND DURATION 03/28/2018 Speech Therapy Frequency (ACUTE ONLY) min 1 x/week Treatment Duration 1 week      CHL IP ORAL PHASE 03/28/2018 Oral Phase WFL Oral - Pudding Teaspoon -- Oral - Pudding Cup -- Oral - Honey Teaspoon -- Oral - Honey Cup -- Oral - Nectar Teaspoon -- Oral - Nectar Cup WFL Oral - Nectar Straw -- Oral - Thin Teaspoon -- Oral - Thin Cup WFL Oral - Thin Straw WFL Oral - Puree WFL Oral - Mech Soft -- Oral - Regular -- Oral - Multi-Consistency -- Oral - Pill WFL Oral Phase - Comment --  CHL IP PHARYNGEAL PHASE 03/28/2018 Pharyngeal Phase WFL Pharyngeal- Pudding Teaspoon -- Pharyngeal -- Pharyngeal- Pudding Cup -- Pharyngeal -- Pharyngeal- Honey Teaspoon -- Pharyngeal -- Pharyngeal- Honey Cup -- Pharyngeal -- Pharyngeal- Nectar Teaspoon -- Pharyngeal -- Pharyngeal- Nectar Cup WFL Pharyngeal -- Pharyngeal- Nectar Straw -- Pharyngeal -- Pharyngeal- Thin Teaspoon -- Pharyngeal -- Pharyngeal- Thin Cup WFL Pharyngeal -- Pharyngeal- Thin Straw WFL Pharyngeal -- Pharyngeal- Puree WFL Pharyngeal -- Pharyngeal- Mechanical Soft -- Pharyngeal -- Pharyngeal- Regular WFL Pharyngeal -- Pharyngeal- Multi-consistency -- Pharyngeal -- Pharyngeal- Pill WFL;Penetration/Aspiration during swallow Pharyngeal Material enters airway, remains ABOVE vocal cords then ejected out Pharyngeal Comment --  CHL IP CERVICAL ESOPHAGEAL PHASE 03/28/2018 Cervical Esophageal Phase Impaired Pudding Teaspoon -- Pudding Cup -- Honey Teaspoon -- Honey Cup -- Nectar Teaspoon -- Nectar Cup -- Nectar Straw -- Thin Teaspoon -- Thin Cup -- Thin Straw -- Puree -- Mechanical Soft -- Regular -- Multi-consistency -- Pill -- Cervical Esophageal Comment barium tablet taken with thin appeared to lodge at [place] and did not clear with 2 extra boluses of thin, pudding bolus transited tablet into stomach - pt without sensation  Macario Golds  03/28/2018, 9:38 AM  [name], MS [facility] SLP Acute Rehab Services Pager [phone] Office [phone]               Assessment & Plan:   Interstitial lung disease due to connective tissue disease ([place]) RA -ILD - recent CT chest stable . PFT in 01/2018 showed stable lung fxn /DLCO .  Will see Dr. Chase Caller next week for evaluation .  Refer to pulmonary rehab when done with home PT .  Cont current regimen .   Asthma Cont on dulera .  proair As needed    Chronic respiratory failure with hypoxia ([place]) Cont on o2   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 esophagitis GERD diet .  follow up w/ GI .       Rexene Edison, NP 04/06/2018

## 2018-04-06 NOTE — Assessment & Plan Note (Signed)
RA -ILD - recent CT chest stable . PFT in 01/2018 showed stable lung fxn /DLCO .  Will see Dr. Chase Caller next week for evaluation .  Refer to pulmonary rehab when done with home PT .  Cont current regimen .

## 2018-04-06 NOTE — Assessment & Plan Note (Signed)
GERD diet .  follow up w/ GI .

## 2018-04-06 NOTE — Patient Instructions (Signed)
Taper Prednisone as directed. Then resume Medrol 4mg  daily.  Continue on Dulera 2 puffs Twice daily  , rinse after use  When you are done with home PT , we will refer to Pulmonary Rehab.  Follow up with GI as discussed .  Continue on Oxygen 2l/m . May try to use POC at home , goal is to keep Oxygen >88-90%.  Follow up with Dr. Chase Caller as planned and As needed   Please contact office for sooner follow up if symptoms do not improve or worsen or seek emergency care

## 2018-04-06 NOTE — Addendum Note (Signed)
Addended by: Joella Prince on: 04/06/2018 10:55 AM   Modules accepted: Orders

## 2018-04-06 NOTE — Assessment & Plan Note (Signed)
Cont on dulera .  proair As needed

## 2018-04-06 NOTE — Assessment & Plan Note (Signed)
Cont on o2 .  

## 2018-04-07 DIAGNOSIS — J45901 Unspecified asthma with (acute) exacerbation: Secondary | ICD-10-CM | POA: Diagnosis not present

## 2018-04-07 DIAGNOSIS — J849 Interstitial pulmonary disease, unspecified: Secondary | ICD-10-CM | POA: Diagnosis not present

## 2018-04-07 DIAGNOSIS — R1314 Dysphagia, pharyngoesophageal phase: Secondary | ICD-10-CM | POA: Diagnosis not present

## 2018-04-07 DIAGNOSIS — I10 Essential (primary) hypertension: Secondary | ICD-10-CM | POA: Diagnosis not present

## 2018-04-07 DIAGNOSIS — M0510 Rheumatoid lung disease with rheumatoid arthritis of unspecified site: Secondary | ICD-10-CM | POA: Diagnosis not present

## 2018-04-07 DIAGNOSIS — J9601 Acute respiratory failure with hypoxia: Secondary | ICD-10-CM | POA: Diagnosis not present

## 2018-04-11 DIAGNOSIS — I10 Essential (primary) hypertension: Secondary | ICD-10-CM | POA: Diagnosis not present

## 2018-04-11 DIAGNOSIS — J849 Interstitial pulmonary disease, unspecified: Secondary | ICD-10-CM | POA: Diagnosis not present

## 2018-04-11 DIAGNOSIS — R1314 Dysphagia, pharyngoesophageal phase: Secondary | ICD-10-CM | POA: Diagnosis not present

## 2018-04-11 DIAGNOSIS — J9601 Acute respiratory failure with hypoxia: Secondary | ICD-10-CM | POA: Diagnosis not present

## 2018-04-11 DIAGNOSIS — M0510 Rheumatoid lung disease with rheumatoid arthritis of unspecified site: Secondary | ICD-10-CM | POA: Diagnosis not present

## 2018-04-11 DIAGNOSIS — J45901 Unspecified asthma with (acute) exacerbation: Secondary | ICD-10-CM | POA: Diagnosis not present

## 2018-04-12 ENCOUNTER — Encounter: Payer: Self-pay | Admitting: Internal Medicine

## 2018-04-12 ENCOUNTER — Ambulatory Visit (INDEPENDENT_AMBULATORY_CARE_PROVIDER_SITE_OTHER): Payer: Medicare Other | Admitting: Internal Medicine

## 2018-04-12 VITALS — BP 116/64 | HR 77 | Ht 66.0 in | Wt 137.2 lb

## 2018-04-12 DIAGNOSIS — M358 Other specified systemic involvement of connective tissue: Secondary | ICD-10-CM | POA: Diagnosis not present

## 2018-04-12 DIAGNOSIS — M069 Rheumatoid arthritis, unspecified: Secondary | ICD-10-CM | POA: Diagnosis not present

## 2018-04-12 DIAGNOSIS — J9611 Chronic respiratory failure with hypoxia: Secondary | ICD-10-CM

## 2018-04-12 DIAGNOSIS — J8489 Other specified interstitial pulmonary diseases: Secondary | ICD-10-CM

## 2018-04-12 LAB — ALLERGENS W/TOTAL IGE AREA 2
Alternaria Alternata IgE: <0.1 kU/L
Aspergillus Fumigatus IgE: <0.1 kU/L
Bermuda Grass IgE: <0.1 kU/L
Cat Dander IgE: <0.1 kU/L
Cedar, Mountain IgE: <0.1 kU/L
Cladosporium Herbarum IgE: <0.1 kU/L
Cockroach, German IgE: <0.1 kU/L
Common Silver Birch IgE: <0.1 kU/L
Cottonwood IgE: <0.1 kU/L
D Farinae IgE: <0.1 kU/L
D Pteronyssinus IgE: <0.1 kU/L
Dog Dander IgE: <0.1 kU/L
Elm, American IgE: <0.1 kU/L
IgE (Immunoglobulin E), Serum: 4 IU/mL — ABNORMAL LOW (ref 6–495)
Johnson Grass IgE: <0.1 kU/L
Maple/Box Elder IgE: <0.1 kU/L
Mouse Urine IgE: <0.1 kU/L
Oak, White IgE: <0.1 kU/L
Pecan, Hickory IgE: <0.1 kU/L
Penicillium Chrysogen IgE: <0.1 kU/L
Pigweed, Rough IgE: <0.1 kU/L
Ragweed, Short IgE: <0.1 kU/L
Sheep Sorrel IgE Qn: <0.1 kU/L
Timothy Grass IgE: <0.1 kU/L
White Mulberry IgE: <0.1 kU/L

## 2018-04-12 LAB — GLUCOSE 6 PHOSPHATE DEHYDROGENASE: G6PDH: UNDETERMINED U/g{Hb}

## 2018-04-12 MED ORDER — MOMETASONE FURO-FORMOTEROL FUM 100-5 MCG/ACT IN AERO: 2.0000 | INHALATION_SPRAY | Freq: Two times a day (BID) | RESPIRATORY_TRACT | 11 refills | Status: DC

## 2018-04-12 NOTE — Patient Instructions (Addendum)
ICD-10-CM   1. 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 Rheumatoid arthritis involving multiple sites, unspecified rheumatoid factor presence (HCC) M06.9   3. 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  But anything beyond that for now you can wear oxygen  -Test overnight oxygen study on room air at home  -[facility] of a prednisone taper  -Hold off on Orencia for the moment  - this is based on the fact you have preserved joint architecture, you are not in significant pain and 4 years ago you able to be off rheumatoid arthritis drugs for 8 weeks without a problem and also possible that Orencia might be contributing to recurrent respiratory infections   -I will touch base with Dr. Eda Paschal at [facility] of rheumatologist as soon as possible  -Continue acid reflux precautions as before  -Continue Symbicort inhaler -given on the presumption of asthma - aim to taper this in future  -Continue physical therapy  Follow-up  -In 4-6 weeks do spirometry and DLCO -Return to [facility] clinic in 4-6 weeks -Decision on follow-up CT scan of the chest timing to be made at the next visit -Depending on the pulmonary function test results and he of course will have to decide how best to treat her pulmonary fibrosis and rheumatoid arthritis in conjunction with your [facility] rheumatologist

## 2018-04-12 NOTE — Progress Notes (Signed)
Brief patient profile:  50 yowf  never smoker with allergies/inhalers as child outgrew by [employer] then  RA since around 2000  Prednisone  x decades and prev eval by Dr Joya Gaskins around 2004 for sob resolved s maint rx and referred 05/26/2013 by Dr Shelia Media for bronchitis and abn cxr   History of Present Illness  05/26/2013 1st [facility] office visit/ Wert cc June 2014 dx pna  In [place] and remicade stopped and 100% better and placed arencia in September 2014  then abruptly worse first week in November with cough green sputum s nasal symptoms, fever low grade and no cp or cough and completely recovered prior to [place] does not recall abx but issue is why keeps getting sick and abn CT Chest (see below).   Arthritis symptoms well controlled at present on Rx for RA rec Nexium 40 mg Take 30-60 min before first meal of the day and add pepcid 20 mg one at bedtime whenever coughing.     10/19/2017  f/u ov/Wert re:  RA lung dz  Chief Complaint  Patient presents with  . Follow-up    Cough is much improved, but has not resolved yet. Cough is non prod. She has not had to use her neb.   Dyspnea:  Not limited by breathing from desired activities  But some doe x steps Cough: daytime > noct dry  Sleep: fine  SABA use:  No saba Medrol 4 mg a/w 8m per day/ ok control of arthritis  rec Start back on gabapentin up to 300 mg each am  in addition to the the two at bedtime  If not better increase the medrol to 8 mg daily until bettter then taper back to where you      01/10/2018  f/u ov/Wert re:  RA  Lung dz Medrol  4 mg  One alternating with a half Chief Complaint  Patient presents with  . Follow-up    PFT's done. Her breathing has been gradually worsening since the last visit. She has occ cough- non prod.   Dyspnea gradually worse since last ov:  MMRC1 =  MMRC3 = can't walk 100 yards even at a slow pace at a flat grade s stopping due to sob    Gradually x 3 m / more fatigue / no change in  arthritis  Cough: not an issue rec Protonix 40 mg Take 30-60 min before first meal of the day  GERD diet   01/17/2018 acute extended ov/Wert re: cough on medrol 4 mg  One a/w one half  Chief Complaint  Patient presents with  . Acute Visit    started coughing 01/11/18- occ prod with minimal green sputum.  She states also wheezing and having increased SOB.    abruptly worse 01/11/18 with severe 24/7 coughing >>  prod min green mucus esp in am/ assoc with subjective wheeze and did not follow previous contingencies re flutter / saba/ increase medrol and admits she does not rember those written instructions nor how to use the neb provided .  No fever/ comfortable at rest sitting  rec For cough > mucinex dm 1200 mg every 12 hours and cough into the flutter valve as much as possible  Doxycycline 100 mg twice daily x 10 days with glass of water Medrol 432mx 2 now and take 2  daily until cough is better then 1 daily x 5 days and then resume the previous dose  Shortness of breath/ wheezing/ still coughing >  albuterol neb every 4 hours as needed      01/20/2018 acute extended ov/Wert re: refractory cough and sob 01/11/18 Chief Complaint  Patient presents with  . Acute Visit    she is not feeling better, coughing , very SOB, wheezing  mucus now clear/scant  on doxy/ neb machine not working (tube would not plug into the side s adequate force and she was not capable of applying it due to RA hands. Cough/ wheeze/ sob 24/7 / flutter not helping/ can't lie down at hs   rec While coughing protonix 40 mg Take 30- 60 min before your first and last meals of the day  Shortness of breath/ wheezing/ still coughing > albuterol neb every 4 hours as needed  Depomedrol 120 mg IM and medrol 32 mg daily x 2 days,  then 16 mg x 3 days,  Then 8 mg x 4 days , then resume the 4 mg daily  For severe cough > tylenol 3# one every 4 hours if needed  Go to ER if condition worsens on above plan       Date of admission:  01/22/2018             Date of discharge: 01/27/2018   History of present illness: As per the H and P dictated on admission, "[name] a77 y.o.female,w Rheumatoid arthritis, ILD Asthma, apparently c/o increase in dyspnea this evening. Dry cough. Denies fever, chills, cp, palp, N/v, diarrhea, brbpr, black stool. Pt notes recently being given steroid injection in office as well as being placed on doxycycline. This might have helped slightly but pt worse Hospital Course:  Summary of her active problems in the hospital is as following. 1 dyspnea/hypoxemia/ILD Concerned that likely GERD Is causing ILD. Patient with cough.  Patient with complaints of awakening with cough and also with oral intake which is slightly improved since 01/24/2018. assessed by speech therapy and speech therapy raising concern of esophageal component but no signs of aspiration.  2D echo with a EF of 55 to 60% with no wall motion abnormalities, grade 1 diastolic dysfunction.  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 Pulmonary felt that the patient should be on scheduled Reglan. I have placed the patient on scheduled potassium before sleep. Continue steroids on discharge continue Mucinex and Claritin as well as inhalers. Patient will follow-up with pulmonary outpatient  2. Gastroesophageal reflux disease Continue PPI and H2 blocker.I changed PPI to [facility].  3. Rheumatoid arthritis Outpatient follow-up.   4. Anxiety Continue Effexor.    All other chronic medical condition were stable during the hospitalization.  Patient was ambulatory without any assistance. On the day of the discharge the patient's vitals were stable , and no other acute medical condition were reported by patient. the patient was felt safe to be discharge at home with family.  Consultants: PCCM  Procedures: Echocardiogram      03/21/2018  f/u ov/Wert re:   S/p admit was  transiently better  and downhill since Labor day on medrol 4 mg daily  Last orencia on Sept 4th 2019  Chief Complaint  Patient presents with  . Acute Visit    Per patient, she has had a dry cough since July 2019. She has been wheezing as well. Increased fatigue. Body aches. Denies any fever or chills.   Dyspnea:  MMRC4  = sob if tries to leave home or while getting dressed   Cough: harsh/ hacking mostly dry/ has flutter not using    SABA  use: not much better with rx   No obvious day to day or daytime variability or assoc excess/ purulent sputum or mucus plugs or hemoptysis or cp or chest tightness, subjective wheeze or overt sinus or hb symptoms.     Also denies any obvious fluctuation of symptoms with weather or environmental changes or other aggravating or alleviating factors except as outlined above   No unusual exposure hx or h/o childhood pna/ asthma or knowledge of premature birth.   INpatient consult 03/26/18 77 year old with rheumatoid arthoritis.At baseline the patient lives at home with her husband and is independent of ADLs.  Has been on many immune suppressants over decades and curently on orencia x 4 year and prednisone. Does not recollect being on bactrim/dapsone. Chart mentions BOOP/MAI in 2001 but she denies this. 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 Got admitted. Rx with steroids. Per Notes - clnical suspicion of  arytenoid inflmmation related wheeze noticed (she also reports asthma NOS). Follolwup with ENT recommended (but not seen one as yet). She also appears to have passed swallow with rec for regular diet with thin liquids but did to have mild eso stricture and reflux during testing . PFT shows 10% FVC decline for first ime. CT chest at this tme (aug 2019) showed new rLL infiltrate. ECHO July 2019 without evicence of  elevated PASP and saw cards [facility] June 2019 and was considered to have worsenin dyspnea due to [facility] issues (reports stress test at [facility] that was normal but I cannot see it)  She reports after discharge she got better but in last several weeks has deteriorated with cough and dyspnea. There is new hypoxemia (currently RA with nail polish and poor circulation - 89% pulse ox) needing 2L [place]. Per [facility] improved with steroids and abx. CTA 03/24/2018 => shows that RLL inifltrate has improved . Other chronic ILD changes + and small  Hiatal hernia + witthout change.  Review of lab work does show eosinophilia at time of admision  EVENTS 03/21/18 - IgE -5, blood allegy panel - negative, 03/23/2018 - - admit . HIGH EOS 2300, ESR 48, BNP 89 , HIV neg 9/12- PCT negative, RVP negative 9/14 -  PCT < 0.1, Urine strep - negative, MRSA PCR - positive. IgE - normal 4, Blood allergy panel repeat - negative 03/26/2018 - leading consideration for airway (BO in RA +/- asthma) related flare either due to MRSA bronchitis or clinical suspicion of arytenoid inflmmation +/- GERD relatd flare (she has small hiatal hernia)  +/- ? Dysphagia  up causing mild hypoxemia acute resp failure, wheeze . Allergy and IgE blood work negative thought. Patient reports being better but says she is choking on drinkin water [facility] MD says wheeze improved significantly with steroids.  RN says was down to RA yesterday evening but needed 1L [place] at sleep. Today -Room air at rest 94% and desaturated to 86% walking 60 feet 03/27/2018  - better. Off o2 at rest. STill coughs with water and when lies down.  Husband at bedside. Both requesting ILD clinic followup . Desaturated t 79% walking 90 feet.   OV 04/12/2018  Subjective:  Patient ID: Stacey [name], female , DOB: 1941-02-17 , age 87 y.o. , MRN: [id number] , ADDRESS: [place] [address]   04/12/2018 -   Chief  Complaint  Patient presents with  . Consult    Pt is a former  [facility] pt.  Pt denies any current complaints of cough, SOB, or CP but states the cough she originally had ended her up in the hosp 9/11-9/17 with dx acute respiratory failure. Pt does wear 2pulse with exertion and also wears 2L continuous when at home.     HPI Stacey [name] 77 y.o. -presents for follow-up to the ILD clinic.  She is known to have rheumatoid arthritis with ILD changes.  She had been followed by Dr. Christinia Gully.  However in July 2019 in September 2019 she has had 2 admissions to the hospital with respiratory distress and hypoxemic respiratory failure.  In the first 1 that seem to be right lower lobe infiltrate and then she improved from it but in the second 1 even though the right lower lobe infiltrates were better she still was hypoxemic.  Acid reflux and dysphagia was considered a possible etiology but she passed swallow study 2 times.  They thought she had some reflux.  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  At the same time it is not clear if the ILD is progressive based on pulmonary function testing below   At this point in time she tells me that she is getting home physical therapy.  Her fatigue is improving but it is not fully resolved.  She was discharged on continuous oxygen which she is using.  However she is feeling less short of breath.  Today in fact when we turned her oxygen off and walked her she did not desaturate and this is a significant improvement.  She is on monthly Orencia through the [facility] rheumatologist Dr. Eda Paschal.  At this point in time she is put the Orencia on hold.  She told me that she is been on Orencia for 4 years and never had a respiratory exacerbation still recently x 2.  Although before going on Orencia she had pneumonia while on Remicade and the Remicade.  In terms of her rheumatoid arthritis she hardly has any pain.  Her joint architecture is fairly well-preserved because of various  immunomodulators over time.  She says that she was on Remicade for years and when she stopped it for 8 weeks before the switch to [place] she never really had a relapse in her rheumatoid arthritis.  She is largely pain and stiffness free.  She believes she can go without  her Orencia for a while.  Review of the literature shows greater than 10% chance of a respiratory infection especially COPD exacerbation.  Although the time frame for this is unclear.    Results for Stacey [name], Stacey [name] (MRN [id number]) as of 04/12/2018 16:06  Ref. Range 07/17/2013 09:53 02/07/2016 13:09 01/11/2017 11:05 01/10/2018 10:13  FVC-Pre Latest Units: L 2.13 2.16 2.14 1.93  FVC-%Pred-Pre Latest Units: % 68 71 71 65  Results for Stacey [name], Stacey [name] (MRN [id number]) as of 04/12/2018 16:06  Ref. Range 07/17/2013 09:53 02/07/2016 13:09 01/11/2017 11:05 01/10/2018 10:13  DLCO unc Latest Units: ml/min/mmHg 18.85 18.25 16.50 16.09  DLCO unc % pred Latest Units: % 69 67 61 59    Simple office walk 185 feet x  3 laps goal with forehead probe 04/12/2018   O2 used Room air - off o2 x 10 min  Number laps completed 3  Comments about pace normal  Resting Pulse Ox/HR 99% and 72/min  Final Pulse Ox/HR 98% and 95/min  Desaturated </=  88% no  Desaturated <= 3% points no  Got Tachycardic >/= 90/min yes  Symptoms at end of test No complaint  Miscellaneous comments improed from hospital      ROS - per HPI     has a past medical history of Abnormal finding of blood chemistry, Asthma, H/O measles, H/O varicella, Hypertension, 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   reports that she has never smoked. She has never used smokeless tobacco.  Past Surgical History:  Procedure Laterality Date  . CHOLECYSTECTOMY  2011  . WISDOM TOOTH EXTRACTION      Allergies  Allergen Reactions  .  Penicillins Other (See Comments)    Reaction unknown occurred during childhood Has patient had a PCN reaction causing immediate rash, facial/tongue/throat swelling, SOB or lightheadedness with hypotension: Unknown Has patient had a PCN reaction causing severe rash involving mucus membranes or skin necrosis: Unknown Has patient had a PCN reaction that required hospitalization: No Has patient had a PCN reaction occurring within the last 10 years: No If all of the above answers are "NO", then may proceed with Cephalosporin use.  Unsur  . Remicade [Infliximab] Other (See Comments)    Reaction unknown  . Sulfa Antibiotics Other (See Comments)    Reaction unknown  . Sulfamethoxazole Other (See Comments)    Unsure of reaction As a child not sure     Immunization History  Administered Date(s) Administered  . Influenza Split 04/12/2013, 04/12/2014, 03/14/2015, 04/23/2016  . Influenza, High Dose Seasonal PF 04/12/2017, 03/24/2018  . Influenza-Unspecified 04/03/2013, 04/22/2017  . Pneumococcal Conjugate-13 04/03/2013, 09/28/2014  . Pneumococcal Polysaccharide-23 07/13/2012, 07/24/2013  . Tdap 12/10/2017    Family History  Problem Relation Age of Onset  . Asthma Mother   . Anemia Mother   . COPD Father   . Pulmonary fibrosis Father   . Breast cancer Maternal Grandmother 45     Current Outpatient Medications:  .  abatacept (ORENCIA) 250 MG injection, Inject 250 mg into the vein every 30 (thirty) days. , Disp: , Rfl:  .  albuterol (PROAIR HFA) 108 (90 Base) MCG/ACT inhaler, Inhale 2 puffs into the lungs every 4 (four) hours as needed for wheezing or shortness of breath., Disp: 1 Inhaler, Rfl: 3 .  albuterol (PROVENTIL) (2.5 MG/3ML) 0.083% nebulizer solution, Inhale 3 mLs (2.5 mg total) into the lungs every 4 (four) hours as needed for wheezing or shortness of breath., Disp: 75 vial, Rfl: 3 .  amitriptyline (ELAVIL) 25 MG tablet, Take 25 mg by mouth at bedtime., Disp: , Rfl: 0 .  beta  carotene w/minerals (OCUVITE) tablet, Take 1 tablet by mouth daily., Disp: , Rfl:  .  cholecalciferol (VITAMIN D) 1000 UNITS tablet, Take 1,000 Units by mouth daily., Disp: , Rfl:  .  gabapentin (NEURONTIN) 300 MG capsule, Take 600 mg by mouth at bedtime., Disp: , Rfl:  .  levalbuterol (XOPENEX) 0.63 MG/3ML nebulizer solution, Inhale 0.63 mg into the lungs 2 (two) times daily as needed for wheezing or shortness of breath. , Disp: , Rfl:  .  losartan (COZAAR) 25 MG tablet, Take 25 mg by mouth daily., Disp: , Rfl: 11 .  mometasone-formoterol (DULERA) 100-5 MCG/ACT AERO, Inhale 2 puffs into the lungs 2 (two) times daily., Disp: 13 g, Rfl: 0 .  pantoprazole (PROTONIX) 40 MG tablet, Take 1 tablet (40 mg total) by mouth 2 (two) times daily before a meal. (Patient taking  differently: Take 40 mg by mouth 2 (two) times daily as needed (For heartburn or acid reflux.). ), Disp: 60 tablet, Rfl: 0 .  polyethylene glycol (MIRALAX / GLYCOLAX) packet, Take 17 g by mouth daily. (Patient taking differently: Take 17 g by mouth daily as needed (For constipation.). ), Disp: 14 each, Rfl: 0 .  predniSONE (DELTASONE) 10 MG tablet, Start taking 50 mg daily for the first 3 days taper by 10 mg every 3 days to 10 mg daily till done., Disp: 40 tablet, Rfl: 0 .  venlafaxine XR (EFFEXOR-XR) 75 MG 24 hr capsule, Take 75 mg by mouth at bedtime. , Disp: , Rfl:  .  ALPRAZolam (XANAX) 0.25 MG tablet, Take 1 tablet (0.25 mg total) by mouth 3 (three) times daily as needed for anxiety. (Patient not taking: Reported on 04/06/2018), Disp: 30 tablet, Rfl: 0 .  dextromethorphan (DELSYM) 30 MG/5ML liquid, Take 2.5 mLs (15 mg total) by mouth at bedtime. (Patient not taking: Reported on 04/12/2018), Disp: 89 mL, Rfl: 0 .  guaiFENesin (MUCINEX) 600 MG 12 hr tablet, Take 600 mg by mouth every 12 (twelve) hours as needed for cough or to loosen phlegm. , Disp: , Rfl:       Objective:   Vitals:   04/12/18 1526  BP: 116/64  Pulse: 77  SpO2: 98%    Weight: 137 lb 3.2 oz (62.2 kg)  Height: 5' 6" (1.676 m)    Estimated body mass index is 22.14 kg/m as calculated from the following:   Height as of this encounter: 5' 6" (1.676 m).   Weight as of this encounter: 137 lb 3.2 oz (62.2 kg).  _0 @  Filed Weights   04/12/18 1526  Weight: 137 lb 3.2 oz (62.2 kg)     Physical Exam  General Appearance:    Alert, cooperative, no distress, appears stated age - yes , Deconditioned looking - no , OBESE  - no, Sitting on Wheelchair -  no  Head:    Normocephalic, without obvious abnormality, atraumatic  Eyes:    PERRL, conjunctiva/corneas clear,  Ears:    Normal TM's and external ear canals, both ears  Nose:   Nares normal, septum midline, mucosa normal, no drainage    or sinus tenderness. OXYGEN ON  - no . Patient is @ ra (turned off 2L [place] )   Throat:   Lips, mucosa, and tongue normal; teeth and gums normal. Cyanosis on lips - no  Neck:   Supple, symmetrical, trachea midline, no adenopathy;    thyroid:  no enlargement/tenderness/nodules; no carotid   bruit or JVD  Back:     Symmetric, no curvature, ROM normal, no CVA tenderness  Lungs:     Distress - no , Wheeze no, Barrell Chest - no, Purse lip breathing - no, Crackles - scattered yes   Chest Wall:    No tenderness or deformity.    Heart:    Regular rate and rhythm, S1 and S2 normal, no rub   or gallop, Murmur - no  Breast Exam:    NOT DONE  Abdomen:     Soft, non-tender, bowel sounds active all four quadrants,    no masses, no organomegaly. Visceral obesity - no  Genitalia:   NOT DONE  Rectal:   NOT DONE  Extremities:   Extremities - normal, Has Cane - no, Clubbing - no, Edema - no. Has RA but joint architecture is preserved fairley well. No RA defomrities  Pulses:   2+ and symmetric all  extremities  Skin:   Stigmata of Connective Tissue Disease - yes mild RA but overall preserved architecture  Lymph nodes:   Cervical, supraclavicular, and axillary nodes normal   Psychiatric:  Neurologic:   Pleasant - es, Anxious - no, Flat affect - no  CAm-ICU - neg, Alert and Oriented x 3 - yes, Moves all 4s - yes, Speech - normal, Cognition - intact           Assessment:       ICD-10-CM   1. 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 Rheumatoid arthritis involving multiple sites, unspecified rheumatoid factor presence (HCC) M06.9   3. Chronic respiratory failure with hypoxia (HCC) J96.11        Plan:     Patient Instructions     ICD-10-CM   1. 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 Rheumatoid arthritis involving multiple sites, unspecified rheumatoid factor presence (HCC) M06.9   3. 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  But anything beyond that for now you can wear oxygen  -Test overnight oxygen study on room air at home  -[facility] of a prednisone taper  -Hold off on Orencia for the moment  - this is based on the fact you have preserved joint architecture, you are not in significant pain and 4 years ago you able to be off rheumatoid arthritis drugs for 8 weeks without a problem and also possible that Orencia might be contributing to recurrent respiratory infections   -I will touch base with Dr. Eda Paschal at [facility] of rheumatologist as soon as possible  -Continue acid reflux precautions as before  -Continue Symbicort inhaler -given on the presumption of asthma  -Continue physical  therapy  Follow-up  -In 4-6 weeks do spirometry and DLCO -Return to ILD clinic in 4-6 weeks -Decision on follow-up CT scan of the chest timing to be made at the next visit -Depending on the pulmonary function test results and he of course will have to decide how best to treat her pulmonary fibrosis and rheumatoid arthritis in conjunction with your [facility] rheumatologist  > 50% of this > 25 min visit spent in face to face counseling or coordination of care - by this undersigned MD - Dr Brand Males. This includes one or more of the following documented above: discussion of test results, diagnostic or treatment recommendations, prognosis, risks and benefits of management options, instructions, education, compliance or risk-factor reduction   SIGNATURE    Dr. Brand Males, M.D., F.C.C.P,  Pulmonary and Critical Care Medicine Staff Physician, [place] Director - Interstitial Lung Disease  Program  Pulmonary [place] [facility] [place], [place], [address]  Pager: [phone], If no answer or between  15:00h - 7:00h: call [phone] Telephone: [phone]  4:34 PM 04/12/2018

## 2018-04-13 ENCOUNTER — Ambulatory Visit: Payer: Medicare Other | Admitting: Internal Medicine

## 2018-04-13 DIAGNOSIS — J9601 Acute respiratory failure with hypoxia: Secondary | ICD-10-CM | POA: Diagnosis not present

## 2018-04-13 DIAGNOSIS — J849 Interstitial pulmonary disease, unspecified: Secondary | ICD-10-CM | POA: Diagnosis not present

## 2018-04-13 DIAGNOSIS — R1314 Dysphagia, pharyngoesophageal phase: Secondary | ICD-10-CM | POA: Diagnosis not present

## 2018-04-13 DIAGNOSIS — J45901 Unspecified asthma with (acute) exacerbation: Secondary | ICD-10-CM | POA: Diagnosis not present

## 2018-04-13 DIAGNOSIS — I10 Essential (primary) hypertension: Secondary | ICD-10-CM | POA: Diagnosis not present

## 2018-04-13 DIAGNOSIS — M0510 Rheumatoid lung disease with rheumatoid arthritis of unspecified site: Secondary | ICD-10-CM | POA: Diagnosis not present

## 2018-04-19 DIAGNOSIS — I10 Essential (primary) hypertension: Secondary | ICD-10-CM | POA: Diagnosis not present

## 2018-04-19 DIAGNOSIS — J45901 Unspecified asthma with (acute) exacerbation: Secondary | ICD-10-CM | POA: Diagnosis not present

## 2018-04-19 DIAGNOSIS — J9601 Acute respiratory failure with hypoxia: Secondary | ICD-10-CM | POA: Diagnosis not present

## 2018-04-19 DIAGNOSIS — J849 Interstitial pulmonary disease, unspecified: Secondary | ICD-10-CM | POA: Diagnosis not present

## 2018-04-19 DIAGNOSIS — M0510 Rheumatoid lung disease with rheumatoid arthritis of unspecified site: Secondary | ICD-10-CM | POA: Diagnosis not present

## 2018-04-19 DIAGNOSIS — R1314 Dysphagia, pharyngoesophageal phase: Secondary | ICD-10-CM | POA: Diagnosis not present

## 2018-04-26 ENCOUNTER — Ambulatory Visit: Payer: Medicare Other | Admitting: Internal Medicine

## 2018-04-26 DIAGNOSIS — M0510 Rheumatoid lung disease with rheumatoid arthritis of unspecified site: Secondary | ICD-10-CM | POA: Diagnosis not present

## 2018-04-26 DIAGNOSIS — R1314 Dysphagia, pharyngoesophageal phase: Secondary | ICD-10-CM | POA: Diagnosis not present

## 2018-04-26 DIAGNOSIS — I10 Essential (primary) hypertension: Secondary | ICD-10-CM | POA: Diagnosis not present

## 2018-04-26 DIAGNOSIS — J45901 Unspecified asthma with (acute) exacerbation: Secondary | ICD-10-CM | POA: Diagnosis not present

## 2018-04-26 DIAGNOSIS — J849 Interstitial pulmonary disease, unspecified: Secondary | ICD-10-CM | POA: Diagnosis not present

## 2018-04-26 DIAGNOSIS — J9601 Acute respiratory failure with hypoxia: Secondary | ICD-10-CM | POA: Diagnosis not present

## 2018-05-05 ENCOUNTER — Encounter: Payer: Self-pay | Admitting: Gastroenterology

## 2018-05-17 ENCOUNTER — Ambulatory Visit (INDEPENDENT_AMBULATORY_CARE_PROVIDER_SITE_OTHER): Payer: Medicare Other | Admitting: Internal Medicine

## 2018-05-17 DIAGNOSIS — M359 Systemic involvement of connective tissue, unspecified: Secondary | ICD-10-CM | POA: Diagnosis not present

## 2018-05-17 DIAGNOSIS — J8489 Other specified interstitial pulmonary diseases: Secondary | ICD-10-CM

## 2018-05-17 DIAGNOSIS — M358 Other specified systemic involvement of connective tissue: Principal | ICD-10-CM

## 2018-05-17 LAB — PULMONARY FUNCTION TEST
DL/VA % pred: 97 %
DL/VA: 4.61 ml/min/mmHg/L
DLCO cor % pred: 74 %
DLCO cor: 17.64 ml/min/mmHg
DLCO unc % pred: 70 %
DLCO unc: 16.58 ml/min/mmHg
FEF 25-75 Pre: 1.16 L/sec
FEF2575-%Pred-Pre: 78 %
FEV1-%Pred-Pre: 76 %
FEV1-Pre: 1.5 L
FEV1FVC-%Pred-Pre: 101 %
FEV6-%Pred-Pre: 79 %
FEV6-Pre: 1.99 L
FEV6FVC-%Pred-Pre: 105 %
FVC-%Pred-Pre: 75 %
FVC-Pre: 1.99 L
Pre FEV1/FVC ratio: 75 %
Pre FEV6/FVC Ratio: 100 %

## 2018-05-17 NOTE — Progress Notes (Signed)
PFT completed today.  

## 2018-05-20 ENCOUNTER — Ambulatory Visit (INDEPENDENT_AMBULATORY_CARE_PROVIDER_SITE_OTHER): Payer: Medicare Other | Admitting: Gastroenterology

## 2018-05-20 ENCOUNTER — Encounter: Payer: Self-pay | Admitting: Gastroenterology

## 2018-05-20 VITALS — BP 110/60 | HR 88 | Ht 65.0 in | Wt 144.0 lb

## 2018-05-20 DIAGNOSIS — K219 Gastro-esophageal reflux disease without esophagitis: Secondary | ICD-10-CM

## 2018-05-20 DIAGNOSIS — R1319 Other dysphagia: Secondary | ICD-10-CM

## 2018-05-20 DIAGNOSIS — K5900 Constipation, unspecified: Secondary | ICD-10-CM

## 2018-05-20 DIAGNOSIS — K222 Esophageal obstruction: Secondary | ICD-10-CM

## 2018-05-20 MED ORDER — PANTOPRAZOLE SODIUM 40 MG PO TBEC: 40.0000 mg | DELAYED_RELEASE_TABLET | Freq: Two times a day (BID) | ORAL | 0 refills | Status: DC

## 2018-05-20 NOTE — Progress Notes (Signed)
Stacey [name]    [id number]    10-Mar-1941  Primary Care Physician:Russo, [name], MD  Referring Physician: Shon Baton, MD [address] [place], [place] [address]  Chief complaint:  Heartburn, Dysphagia  HPI: 77 year old female with history of rheumatoid arthritis and interstitial lung disease here with complaints of intermittent dysphagia and heartburn. She has intermittent dysphagia with mostly solids like bread, does not eat much meat usually other than hamburger. Heartburn better since started Protonix Barium esophagram July 2019 showed gastroesophageal reflux.  13 mm pill lodged at the EG junction transiently.  On modified barium swallow September 2019 barium tablet was again lodged at the EG junction and did not clear with 2 extra boluses of thin liquid. Was on oxygen at bedtime after discharge from hospital in September for a few weeks but Dr. Chase Caller advised her to stop the oxygen as she does not need.  She is not on home O2 anymore for the past 3 to 4 weeks  BM irregular with only once weekly.  History of chronic constipation Last colonoscopy about 5 to 10 years ago by Dr. Watt Climes at [facility], report not available during this office visit.   Outpatient Encounter Medications as of 05/20/2018  Medication Sig  . albuterol (PROAIR HFA) 108 (90 Base) MCG/ACT inhaler Inhale 2 puffs into the lungs every 4 (four) hours as needed for wheezing or shortness of breath.  Marland Kitchen albuterol (PROVENTIL) (2.5 MG/3ML) 0.083% nebulizer solution Inhale 3 mLs (2.5 mg total) into the lungs every 4 (four) hours as needed for wheezing or shortness of breath.  Marland Kitchen amitriptyline (ELAVIL) 25 MG tablet Take 25 mg by mouth at bedtime.  . beta carotene w/minerals (OCUVITE) tablet Take 1 tablet by mouth daily.  . cholecalciferol (VITAMIN D) 1000 UNITS tablet Take 1,000 Units by mouth daily.  Marland Kitchen dextromethorphan (DELSYM) 30 MG/5ML liquid Take 2.5 mLs (15 mg total) by mouth at bedtime. (Patient taking  differently: Take 15 mg by mouth as needed. )  . gabapentin (NEURONTIN) 300 MG capsule Take 600 mg by mouth at bedtime.  . levalbuterol (XOPENEX) 0.63 MG/3ML nebulizer solution Inhale 0.63 mg into the lungs 2 (two) times daily as needed for wheezing or shortness of breath.   . losartan (COZAAR) 25 MG tablet Take 25 mg by mouth daily.  . methylPREDNISolone (MEDROL DOSEPAK) 4 MG TBPK tablet Take by mouth. tapering  . mometasone-formoterol (DULERA) 100-5 MCG/ACT AERO Inhale 2 puffs into the lungs 2 (two) times daily.  . pantoprazole (PROTONIX) 40 MG tablet Take 1 tablet (40 mg total) by mouth 2 (two) times daily before a meal. (Patient taking differently: Take 40 mg by mouth 2 (two) times daily as needed (For heartburn or acid reflux.). )  . polyethylene glycol (MIRALAX / GLYCOLAX) packet Take 17 g by mouth daily. (Patient taking differently: Take 17 g by mouth daily as needed (For constipation.). )  . venlafaxine XR (EFFEXOR-XR) 75 MG 24 hr capsule Take 75 mg by mouth at bedtime.   . [DISCONTINUED] abatacept (ORENCIA) 250 MG injection Inject 250 mg into the vein every 30 (thirty) days.   . [DISCONTINUED] ALPRAZolam (XANAX) 0.25 MG tablet Take 1 tablet (0.25 mg total) by mouth 3 (three) times daily as needed for anxiety. (Patient not taking: Reported on 04/06/2018)  . [DISCONTINUED] guaiFENesin (MUCINEX) 600 MG 12 hr tablet Take 600 mg by mouth every 12 (twelve) hours as needed for cough or to loosen phlegm.   . [DISCONTINUED]  predniSONE (DELTASONE) 10 MG tablet Start taking 50 mg daily for the first 3 days taper by 10 mg every 3 days to 10 mg daily till done.   No facility-administered encounter medications on file as of 05/20/2018.     Allergies as of 05/20/2018 - Review Complete 05/20/2018  Allergen Reaction Noted  . Penicillins Other (See Comments) 02/09/2006  . Remicade [infliximab] Other (See Comments) 08/20/2015  . Sulfa antibiotics Other (See Comments) 05/12/2006  . Sulfamethoxazole Other  (See Comments) 01/08/2014    Past Medical History:  Diagnosis Date  . Abnormal finding of blood chemistry   . Asthma   . H/O measles   . H/O varicella   . Hypertension   . 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[place])   . 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 Lichen sclerosus 01/05/93   Asymptomatic  . Low iron   . Mitral valve prolapse   . Osteoarthritis   . Osteoporosis   . Pneumonia   . Post herpetic neuralgia   . Rheumatoid arthritis(714.0)   . Yeast infection     Past Surgical History:  Procedure Laterality Date  . CHOLECYSTECTOMY  2011  . WISDOM TOOTH EXTRACTION      Family History  Problem Relation Age of Onset  . Asthma Mother   . Anemia Mother   . COPD Father   . Pulmonary fibrosis Father   . Breast cancer Maternal Grandmother 88    Social History   Socioeconomic History  . Marital status: Married    Spouse name: Not on file  . Number of children: 2  . Years of education: BA  . Highest education level: Not on file  Occupational History  . Occupation: Retired  Scientific laboratory technician  . Financial resource strain: Not on file  . Food insecurity:    Worry: Not on file    Inability: Not on file  . Transportation needs:    Medical: Not on file    Non-medical: Not on file  Tobacco Use  . Smoking status: Never Smoker  . Smokeless tobacco: Never Used  Substance and Sexual Activity  . Alcohol use: No  . Drug use: No  . Sexual activity: Yes  Lifestyle  . Physical activity:    Days per week: Not on file    Minutes per session: Not on file  . Stress: Not on file  Relationships  . Social connections:    Talks on phone: Not on file    Gets together: Not on file    Attends religious service: Not on file    Active member of club or organization: Not on file    Attends meetings of clubs or organizations: Not on file    Relationship status: Not on file  . Intimate partner violence:    Fear of current or ex partner: Not on file    Emotionally abused: Not on file    Physically  abused: Not on file    Forced sexual activity: Not on file  Other Topics Concern  . Not on file  Social History Narrative   Drinks 1 cup of coffee and 1 diet coke a day       Review of systems: Review of Systems  Constitutional: Negative for fever and chills.  HENT: Negative.   Eyes: Negative for blurred vision.  Respiratory: Positive for cough, shortness of breath and wheezing.   Cardiovascular: Negative for chest pain and palpitations.  Gastrointestinal: as per HPI Genitourinary: Negative for dysuria, urgency, frequency and hematuria.  Musculoskeletal: Positive  for myalgias, back pain and joint pain.  Skin: Negative for itching and rash.  Neurological: Negative for dizziness, tremors, focal weakness, seizures and loss of consciousness.  Endo/Heme/Allergies: Positive for seasonal allergies.  Psychiatric/Behavioral: Negative for depression, suicidal ideas and hallucinations.  All other systems reviewed and are negative.   Physical Exam: Vitals:   05/20/18 1048  BP: 110/60  Pulse: 88   Body mass index is 23.96 kg/m. Gen:      No acute distress HEENT:  EOMI, sclera anicteric Neck:     No masses; no thyromegaly Lungs:    Clear to auscultation bilaterally; normal respiratory effort CV:         Regular rate and rhythm; no murmurs Abd:      + bowel sounds; soft, non-tender; no palpable masses, no distension Ext:    No edema; adequate peripheral perfusion Skin:      Warm and dry; no rash Neuro: alert and oriented x 3 Psych: normal mood and affect  Data Reviewed:  Reviewed labs, radiology imaging, old records and pertinent past GI work up   Assessment and Plan/Recommendations:  77 year old female with history of rheumatoid arthritis, asthma, ILD here for evaluation of GERD and distal esophageal stricture noted on barium esophagram Heartburn and reflux symptoms have improved since she started Protonix, continue PPI twice daily before breakfast dinner Discussed antireflux  measures Intermittent solid dysphagia especially worse with bread We will schedule for EGD for evaluation of distal esophageal stricture and possible dilation if needed The risks and benefits as well as alternatives of endoscopic procedure(s) have been discussed and reviewed. All questions answered. The patient agrees to proceed.  Irregular bowel habits with chronic constipation Increase fluid intake to 60  ounces daily Start Benefiber 1 teaspoon 3 times daily with meals If continues to have persistent constipation will consider starting laxative at next visit Will obtain records of prior colonoscopy from Dr. Peter Garter , MD [phone]    CC: Shon Baton, MD

## 2018-05-20 NOTE — Patient Instructions (Addendum)
You have been scheduled for an endoscopy. Please follow written instructions given to you at your visit today. If you use inhalers (even only as needed), please bring them with you on the day of your procedure.   We have sent Protonix to your pharmacy  Use Benefiber as needed 1 tsp 2-3 times daily    Gastroesophageal Reflux Disease, Adult Normally, food travels down the esophagus and stays in the stomach to be digested. However, when a person has gastroesophageal reflux disease (GERD), food and stomach acid move back up into the esophagus. When this happens, the esophagus becomes sore and inflamed. Over time, GERD can create small holes (ulcers) in the lining of the esophagus. What are the causes? This condition is caused by a problem with the muscle between the esophagus and the stomach (lower esophageal sphincter, or LES). Normally, the LES muscle closes after food passes through the esophagus to the stomach. When the LES is weakened or abnormal, it does not close properly, and that allows food and stomach acid to go back up into the esophagus. The LES can be weakened by certain dietary substances, medicines, and medical conditions, including:  Tobacco use.  Pregnancy.  Having a hiatal hernia.  Heavy alcohol use.  Certain foods and beverages, such as coffee, chocolate, onions, and peppermint.  What increases the risk? This condition is more likely to develop in:  People who have an increased body weight.  People who have connective tissue disorders.  People who use NSAID medicines.  What are the signs or symptoms? Symptoms of this condition include:  Heartburn.  Difficult or painful swallowing.  The feeling of having a lump in the throat.  Abitter taste in the mouth.  Bad breath.  Having a large amount of saliva.  Having an upset or bloated stomach.  Belching.  Chest pain.  Shortness of breath or wheezing.  Ongoing (chronic) cough or a night-time  cough.  Wearing away of tooth enamel.  Weight loss.  Different conditions can cause chest pain. Make sure to see your health care provider if you experience chest pain. How is this diagnosed? Your health care provider will take a medical history and perform a physical exam. To determine if you have mild or severe GERD, your health care provider may also monitor how you respond to treatment. You may also have other tests, including:  An endoscopy toexamine your stomach and esophagus with a small camera.  A test thatmeasures the acidity level in your esophagus.  A test thatmeasures how much pressure is on your esophagus.  A barium swallow or modified barium swallow to show the shape, size, and functioning of your esophagus.  How is this treated? The goal of treatment is to help relieve your symptoms and to prevent complications. Treatment for this condition may vary depending on how severe your symptoms are. Your health care provider may recommend:  Changes to your diet.  Medicine.  Surgery.  Follow these instructions at home: Diet  Follow a diet as recommended by your health care provider. This may involve avoiding foods and drinks such as: ? Coffee and tea (with or without caffeine). ? Drinks that containalcohol. ? Energy drinks and sports drinks. ? Carbonated drinks or sodas. ? Chocolate and cocoa. ? Peppermint and mint flavorings. ? Garlic and onions. ? Horseradish. ? Spicy and acidic foods, including peppers, chili powder, curry powder, vinegar, hot sauces, and barbecue sauce. ? Citrus fruit juices and citrus fruits, such as oranges, lemons, and limes. ? Tomato-based  foods, such as red sauce, chili, salsa, and pizza with red sauce. ? Fried and fatty foods, such as donuts, french fries, potato chips, and high-fat dressings. ? High-fat meats, such as hot dogs and fatty cuts of red and white meats, such as rib eye steak, sausage, ham, and bacon. ? High-fat dairy items,  such as whole milk, butter, and cream cheese.  Eat small, frequent meals instead of large meals.  Avoid drinking large amounts of liquid with your meals.  Avoid eating meals during the 2-3 hours before bedtime.  Avoid lying down right after you eat.  Do not exercise right after you eat. General instructions  Pay attention to any changes in your symptoms.  Take over-the-counter and prescription medicines only as told by your health care provider. Do not take aspirin, ibuprofen, or other NSAIDs unless your health care provider told you to do so.  Do not use any tobacco products, including cigarettes, chewing tobacco, and e-cigarettes. If you need help quitting, ask your health care provider.  Wear loose-fitting clothing. Do not wear anything tight around your waist that causes pressure on your abdomen.  Raise (elevate) the head of your bed 6 inches (15cm).  Try to reduce your stress, such as with yoga or meditation. If you need help reducing stress, ask your health care provider.  If you are overweight, reduce your weight to an amount that is healthy for you. Ask your health care provider for guidance about a safe weight loss goal.  Keep all follow-up visits as told by your health care provider. This is important. Contact a health care provider if:  You have new symptoms.  You have unexplained weight loss.  You have difficulty swallowing, or it hurts to swallow.  You have wheezing or a persistent cough.  Your symptoms do not improve with treatment.  You have a hoarse voice. Get help right away if:  You have pain in your arms, neck, jaw, teeth, or back.  You feel sweaty, dizzy, or light-headed.  You have chest pain or shortness of breath.  You vomit and your vomit looks like blood or coffee grounds.  You faint.  Your stool is bloody or black.  You cannot swallow, drink, or eat. This information is not intended to replace advice given to you by your health care  provider. Make sure you discuss any questions you have with your health care provider. Document Released: 04/08/2005 Document Revised: 11/27/2015 Document Reviewed: 10/24/2014 Elsevier Interactive Patient Education  2018 [place] for Gastroesophageal Reflux Disease, Adult When you have gastroesophageal reflux disease (GERD), the foods you eat and your eating habits are very important. Choosing the right foods can help ease your discomfort. What guidelines do I need to follow?  Choose fruits, vegetables, whole grains, and low-fat dairy products.  Choose low-fat meat, fish, and poultry.  Limit fats such as oils, salad dressings, butter, nuts, and avocado.  Keep a food diary. This helps you identify foods that cause symptoms.  Avoid foods that cause symptoms. These may be different for everyone.  Eat small meals often instead of 3 large meals a day.  Eat your meals slowly, in a place where you are relaxed.  Limit fried foods.  Cook foods using methods other than frying.  Avoid drinking alcohol.  Avoid drinking large amounts of liquids with your meals.  Avoid bending over or lying down until 2-3 hours after eating. What foods are not recommended? These are some foods and drinks that  may make your symptoms worse: Vegetables Tomatoes. Tomato juice. Tomato and spaghetti sauce. Chili peppers. Onion and garlic. Horseradish. Fruits Oranges, grapefruit, and lemon (fruit and juice). Meats High-fat meats, fish, and poultry. This includes hot dogs, ribs, ham, sausage, salami, and bacon. Dairy Whole milk and chocolate milk. Sour cream. Cream. Butter. Ice cream. Cream cheese. Drinks Coffee and tea. Bubbly (carbonated) drinks or energy drinks. Condiments Hot sauce. Barbecue sauce. Sweets/Desserts Chocolate and cocoa. Donuts. Peppermint and spearmint. Fats and Oils High-fat foods. This includes [place] fries and potato chips. Other Vinegar. Strong spices. This  includes black pepper, white pepper, red pepper, cayenne, curry powder, cloves, ginger, and chili powder. The items listed above may not be a complete list of foods and drinks to avoid. Contact your dietitian for more information. This information is not intended to replace advice given to you by your health care provider. Make sure you discuss any questions you have with your health care provider. Document Released: 12/29/2011 Document Revised: 12/05/2015 Document Reviewed: 05/03/2013 Elsevier Interactive Patient Education  2017 [employer].

## 2018-05-23 ENCOUNTER — Encounter: Payer: Self-pay | Admitting: Gastroenterology

## 2018-06-07 ENCOUNTER — Encounter: Payer: Self-pay | Admitting: Internal Medicine

## 2018-06-07 ENCOUNTER — Ambulatory Visit (INDEPENDENT_AMBULATORY_CARE_PROVIDER_SITE_OTHER): Payer: Medicare Other | Admitting: Internal Medicine

## 2018-06-07 VITALS — BP 128/70 | HR 86 | Ht 63.5 in | Wt 141.0 lb

## 2018-06-07 DIAGNOSIS — M359 Systemic involvement of connective tissue, unspecified: Secondary | ICD-10-CM

## 2018-06-07 DIAGNOSIS — M358 Other specified systemic involvement of connective tissue: Secondary | ICD-10-CM | POA: Diagnosis not present

## 2018-06-07 DIAGNOSIS — M069 Rheumatoid arthritis, unspecified: Secondary | ICD-10-CM

## 2018-06-07 DIAGNOSIS — J45909 Unspecified asthma, uncomplicated: Secondary | ICD-10-CM

## 2018-06-07 DIAGNOSIS — J209 Acute bronchitis, unspecified: Secondary | ICD-10-CM

## 2018-06-07 DIAGNOSIS — J8489 Other specified interstitial pulmonary diseases: Secondary | ICD-10-CM

## 2018-06-07 LAB — PULMONARY FUNCTION TEST
DL/VA % pred: 80 %
DL/VA: 3.8 ml/min/mmHg/L
DLCO unc % pred: 59 %
DLCO unc: 14.06 ml/min/mmHg
FEF 25-75 Pre: 0.7 L/sec
FEF2575-%Pred-Pre: 47 %
FEV1-%Pred-Pre: 63 %
FEV1-Pre: 1.25 L
FEV1FVC-%Pred-Pre: 92 %
FEV6-%Pred-Pre: 72 %
FEV6-Pre: 1.8 L
FEV6FVC-%Pred-Pre: 104 %
FVC-%Pred-Pre: 68 %
FVC-Pre: 1.81 L
Pre FEV1/FVC ratio: 69 %
Pre FEV6/FVC Ratio: 99 %

## 2018-06-07 LAB — NITRIC OXIDE: FeNO level (ppb): 22

## 2018-06-07 MED ORDER — METHYLPREDNISOLONE 8 MG PO TABS: ORAL_TABLET | ORAL | 0 refills | Status: DC

## 2018-06-07 MED ORDER — DOXYCYCLINE HYCLATE 100 MG PO TABS: 100.0000 mg | ORAL_TABLET | Freq: Two times a day (BID) | ORAL | 0 refills | Status: DC

## 2018-06-07 NOTE — Progress Notes (Signed)
Brief patient profile:  2 yowf  never smoker with allergies/inhalers as child outgrew by [employer] then  RA since around 2000  Prednisone  x decades and prev eval by Dr Joya Gaskins around 2004 for sob resolved s maint rx and referred 05/26/2013 by Dr Shelia Media for bronchitis and abn cxr   History of Present Illness  05/26/2013 1st [facility] office visit/ Wert cc June 2014 dx pna  In [place] and remicade stopped and 100% better and placed arencia in September 2014  then abruptly worse first week in November with cough green sputum s nasal symptoms, fever low grade and no cp or cough and completely recovered prior to [place] does not recall abx but issue is why keeps getting sick and abn CT Chest (see below).   Arthritis symptoms well controlled at present on Rx for RA rec Nexium 40 mg Take 30-60 min before first meal of the day and add pepcid 20 mg one at bedtime whenever coughing.     10/19/2017  f/u ov/Wert re:  RA lung dz  Chief Complaint  Patient presents with  . Follow-up    Cough is much improved, but has not resolved yet. Cough is non prod. She has not had to use her neb.   Dyspnea:  Not limited by breathing from desired activities  But some doe x steps Cough: daytime > noct dry  Sleep: fine  SABA use:  No saba Medrol 4 mg a/w 29m per day/ ok control of arthritis  rec Start back on gabapentin up to 300 mg each am  in addition to the the two at bedtime  If not better increase the medrol to 8 mg daily until bettter then taper back to where you      01/10/2018  f/u ov/Wert re:  RA  Lung dz Medrol  4 mg  One alternating with a half Chief Complaint  Patient presents with  . Follow-up    PFT's done. Her breathing has been gradually worsening since the last visit. She has occ cough- non prod.   Dyspnea gradually worse since last ov:  MMRC1 =  MMRC3 = can't walk 100 yards even at a slow pace at a flat grade s stopping due to sob    Gradually x 3 m / more fatigue / no change in  arthritis  Cough: not an issue rec Protonix 40 mg Take 30-60 min before first meal of the day  GERD diet   01/17/2018 acute extended ov/Wert re: cough on medrol 4 mg  One a/w one half  Chief Complaint  Patient presents with  . Acute Visit    started coughing 01/11/18- occ prod with minimal green sputum.  She states also wheezing and having increased SOB.    abruptly worse 01/11/18 with severe 24/7 coughing >>  prod min green mucus esp in am/ assoc with subjective wheeze and did not follow previous contingencies re flutter / saba/ increase medrol and admits she does not rember those written instructions nor how to use the neb provided .  No fever/ comfortable at rest sitting  rec For cough > mucinex dm 1200 mg every 12 hours and cough into the flutter valve as much as possible  Doxycycline 100 mg twice daily x 10 days with glass of water Medrol 470mx 2 now and take 2  daily until cough is better then 1 daily x 5 days and then resume the previous dose  Shortness of breath/ wheezing/ still coughing >  albuterol neb every 4 hours as needed      01/20/2018 acute extended ov/Wert re: refractory cough and sob 01/11/18 Chief Complaint  Patient presents with  . Acute Visit    she is not feeling better, coughing , very SOB, wheezing  mucus now clear/scant  on doxy/ neb machine not working (tube would not plug into the side s adequate force and she was not capable of applying it due to RA hands. Cough/ wheeze/ sob 24/7 / flutter not helping/ can't lie down at hs   rec While coughing protonix 40 mg Take 30- 60 min before your first and last meals of the day  Shortness of breath/ wheezing/ still coughing > albuterol neb every 4 hours as needed  Depomedrol 120 mg IM and medrol 32 mg daily x 2 days,  then 16 mg x 3 days,  Then 8 mg x 4 days , then resume the 4 mg daily  For severe cough > tylenol 3# one every 4 hours if needed  Go to ER if condition worsens on above plan       Date of admission:  01/22/2018             Date of discharge: 01/27/2018   History of present illness: As per the H and P dictated on admission, "[name] a77 y.o.female,w Rheumatoid arthritis, ILD Asthma, apparently c/o increase in dyspnea this evening. Dry cough. Denies fever, chills, cp, palp, N/v, diarrhea, brbpr, black stool. Pt notes recently being given steroid injection in office as well as being placed on doxycycline. This might have helped slightly but pt worse Hospital Course:  Summary of her active problems in the hospital is as following. 1 dyspnea/hypoxemia/ILD Concerned that likely GERD Is causing ILD. Patient with cough.  Patient with complaints of awakening with cough and also with oral intake which is slightly improved since 01/24/2018. assessed by speech therapy and speech therapy raising concern of esophageal component but no signs of aspiration.  2D echo with a EF of 55 to 60% with no wall motion abnormalities, grade 1 diastolic dysfunction.  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 Pulmonary felt that the patient should be on scheduled Reglan. I have placed the patient on scheduled potassium before sleep. Continue steroids on discharge continue Mucinex and Claritin as well as inhalers. Patient will follow-up with pulmonary outpatient  2. Gastroesophageal reflux disease Continue PPI and H2 blocker.I changed PPI to [facility].  3. Rheumatoid arthritis Outpatient follow-up.   4. Anxiety Continue Effexor.    All other chronic medical condition were stable during the hospitalization.  Patient was ambulatory without any assistance. On the day of the discharge the patient's vitals were stable , and no other acute medical condition were reported by patient. the patient was felt safe to be discharge at home with family.  Consultants: PCCM  Procedures: Echocardiogram      03/21/2018  f/u ov/Wert re:   S/p admit was  transiently better  and downhill since Labor day on medrol 4 mg daily  Last orencia on Sept 4th 2019  Chief Complaint  Patient presents with  . Acute Visit    Per patient, she has had a dry cough since July 2019. She has been wheezing as well. Increased fatigue. Body aches. Denies any fever or chills.   Dyspnea:  MMRC4  = sob if tries to leave home or while getting dressed   Cough: harsh/ hacking mostly dry/ has flutter not using    SABA  use: not much better with rx   No obvious day to day or daytime variability or assoc excess/ purulent sputum or mucus plugs or hemoptysis or cp or chest tightness, subjective wheeze or overt sinus or hb symptoms.     Also denies any obvious fluctuation of symptoms with weather or environmental changes or other aggravating or alleviating factors except as outlined above   No unusual exposure hx or h/o childhood pna/ asthma or knowledge of premature birth.   INpatient consult 03/26/18 77 year old with rheumatoid arthoritis.At baseline the patient lives at home with her husband and is independent of ADLs.  Has been on many immune suppressants over decades and curently on orencia x 4 year and prednisone. Does not recollect being on bactrim/dapsone. Chart mentions BOOP/MAI in 2001 but she denies this. 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 Got admitted. Rx with steroids. Per Notes - clnical suspicion of  arytenoid inflmmation related wheeze noticed (she also reports asthma NOS). Follolwup with ENT recommended (but not seen one as yet). She also appears to have passed swallow with rec for regular diet with thin liquids but did to have mild eso stricture and reflux during testing . PFT shows 10% FVC decline for first ime. CT chest at this tme (aug 2019) showed new rLL infiltrate. ECHO July 2019 without evicence of  elevated PASP and saw cards [facility] June 2019 and was considered to have worsenin dyspnea due to [facility] issues (reports stress test at [facility] that was normal but I cannot see it)  She reports after discharge she got better but in last several weeks has deteriorated with cough and dyspnea. There is new hypoxemia (currently RA with nail polish and poor circulation - 89% pulse ox) needing 2L [place]. Per [facility] improved with steroids and abx. CTA 03/24/2018 => shows that RLL inifltrate has improved . Other chronic ILD changes + and small  Hiatal hernia + witthout change.  Review of lab work does show eosinophilia at time of admision  EVENTS 03/21/18 - IgE -5, blood allegy panel - negative, 03/23/2018 - - admit . HIGH EOS 2300, ESR 48, BNP 89 , HIV neg 9/12- PCT negative, RVP negative 9/14 -  PCT < 0.1, Urine strep - negative, MRSA PCR - positive. IgE - normal 4, Blood allergy panel repeat - negative 03/26/2018 - leading consideration for airway (BO in RA +/- asthma) related flare either due to MRSA bronchitis or clinical suspicion of arytenoid inflmmation +/- GERD relatd flare (she has small hiatal hernia)  +/- ? Dysphagia  up causing mild hypoxemia acute resp failure, wheeze . Allergy and IgE blood work negative thought. Patient reports being better but says she is choking on drinkin water [facility] MD says wheeze improved significantly with steroids.  RN says was down to RA yesterday evening but needed 1L [place] at sleep. Today -Room air at rest 94% and desaturated to 86% walking 60 feet 03/27/2018  - better. Off o2 at rest. STill coughs with water and when lies down.  Husband at bedside. Both requesting ILD clinic followup . Desaturated t 79% walking 90 feet.   OV 04/12/2018  Subjective:  Patient ID: Tamala Fothergill, female , DOB: 1941-05-08 , age 6 y.o. , MRN: [id number] , ADDRESS: [place] [address]   04/12/2018 -   Chief  Complaint  Patient presents with  . Consult    Pt is a former  [facility] pt.  Pt denies any current complaints of cough, SOB, or CP but states the cough she originally had ended her up in the hosp 9/11-9/17 with dx acute respiratory failure. Pt does wear 2pulse with exertion and also wears 2L continuous when at home.     HPI JAHNAI SLINGERLAND 77 y.o. -presents for follow-up to the ILD clinic.  She is known to have rheumatoid arthritis with ILD changes.  She had been followed by Dr. Christinia Gully.  However in July 2019 in September 2019 she has had 2 admissions to the hospital with respiratory distress and hypoxemic respiratory failure.  In the first 1 that seem to be right lower lobe infiltrate and then she improved from it but in the second 1 even though the right lower lobe infiltrates were better she still was hypoxemic.  Acid reflux and dysphagia was considered a possible etiology but she passed swallow study 2 times.  They thought she had some reflux.  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  At the same time it is not clear if the ILD is progressive based on pulmonary function testing below   At this point in time she tells me that she is getting home physical therapy.  Her fatigue is improving but it is not fully resolved.  She was discharged on continuous oxygen which she is using.  However she is feeling less short of breath.  Today in fact when we turned her oxygen off and walked her she did not desaturate and this is a significant improvement.  She is on monthly Orencia through the [facility] rheumatologist Dr. Eda Paschal.  At this point in time she is put the Orencia on hold.  She told me that she is been on Orencia for 4 years and never had a respiratory exacerbation still recently x 2.  Although before going on Orencia she had pneumonia while on Remicade and the Remicade.  In terms of her rheumatoid arthritis she hardly has any pain.  Her joint architecture is fairly well-preserved because of various  immunomodulators over time.  She says that she was on Remicade for years and when she stopped it for 8 weeks before the switch to [place] she never really had a relapse in her rheumatoid arthritis.  She is largely pain and stiffness free.  She believes she can go without  her Orencia for a while.  Review of the literature shows greater than 10% chance of a respiratory infection especially COPD exacerbation.  Although the time frame for this is unclear.       OV 06/07/2018  Subjective:  Patient ID: Tamala Fothergill, female , DOB: 1941/06/06 , age 5 y.o. , MRN: [id number] , ADDRESS: [place] [address]   06/07/2018 -   Chief Complaint  Patient presents with  . Follow-up    ILD, PFT done today, some wheezing and coughing but better tha before   Rheumatoid arthritis ILD and asthma/obstructive lung disease phenotype on Dulera  HPI ELLENA KAMEN 77 y.o. -presents for routine follow-up with her husband.  She is here to follow-up with [facility] Dr. Stann Mainland.  She plans to do this in December 2019.  She continues to be off Orencia.  Her joints are slowly getting stiff again.  She believes that she will need to be back on immunosuppression agent again.  She currently  continues Medrol 4 mg alternating with 2 mg.  This for her rheumatoid arthritis.  In terms of her joints she continues on Medrol 4 mg alternating with 2 mg but not on any other immunosuppression agent.  Overall she is been stable but for the last 2 weeks has had green sputum and wheezing and chest congestion and cough.  She recently visited her husband who was hospitalized and walking the long hallways at [facility] made a short of breath but she thinks this is probably baseline for her.  There are no other new issues.  She did have spirometry and DLCO and this shows a decline compared to September 2019 and a similar to July 2019.  It is documented below.  This is probably reflective of a  flareup     Results for LEZLEY, BEDGOOD (MRN [id number]) as of 06/07/2018 14:25  Ref. Range 07/17/2013 09:53 02/07/2016 13:09 01/11/2017 11:05 01/10/2018 10:13 03/25/2018 14:58 06/07/2018 10:57  FVC-Pre Latest Units: L 2.13 2.16 2.14 1.93 1.99 1.81  FVC-%Pred-Pre Latest Units: % 68 71 71 65 75 68  Results for SHIRLEAN, BERMAN (MRN [id number]) as of 06/07/2018 14:25  Ref. Range 07/17/2013 09:53 02/07/2016 13:09 01/11/2017 11:05 01/10/2018 10:13 03/25/2018 14:58 06/07/2018 10:57  DLCO unc Latest Units: ml/min/mmHg 18.85 18.25 16.50 16.09 16.58 14.06  DLCO unc % pred Latest Units: % 69 67 61 59 70 59     Simple office walk 185 feet x  3 laps goal with forehead probe 04/12/2018   O2 used Room air - off o2 x 10 min  Number laps completed 3  Comments about pace normal  Resting Pulse Ox/HR 99% and 72/min  Final Pulse Ox/HR 98% and 95/min  Desaturated </= 88% no  Desaturated <= 3% points no  Got Tachycardic >/= 90/min yes  Symptoms at end of test No complaint  Miscellaneous comments improed from hospital    ROS - per HPI     has a past medical history of Abnormal finding of blood chemistry, Asthma, H/O measles, H/O varicella, Hypertension, 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[place]), 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   reports that she has never smoked. She has never used smokeless tobacco.  Past Surgical History:  Procedure Laterality Date  . CHOLECYSTECTOMY  2011  . WISDOM TOOTH EXTRACTION      Allergies  Allergen Reactions  . Penicillins Other (See Comments)    Reaction unknown occurred during childhood Has patient had a PCN reaction causing immediate rash, facial/tongue/throat swelling, SOB or lightheadedness with hypotension: Unknown Has patient had a PCN reaction causing severe rash involving mucus membranes or skin  necrosis: Unknown Has patient had a PCN reaction that required hospitalization: No Has patient had a PCN reaction occurring within the last 10 years: No If all of the above answers are "NO", then may proceed with Cephalosporin use.  Unsur  . Remicade [Infliximab] Other (See Comments)    Reaction unknown  . Sulfa Antibiotics Other (See Comments)    Reaction unknown  . Sulfamethoxazole Other (See Comments)    Unsure of reaction As a child not sure     Immunization History  Administered Date(s) Administered  . Influenza Split 04/12/2013, 04/12/2014, 03/14/2015, 04/23/2016  . Influenza, High Dose Seasonal PF 04/12/2017, 03/24/2018  . Influenza-Unspecified 04/03/2013, 04/22/2017  . Pneumococcal Conjugate-13 04/03/2013, 09/28/2014  . Pneumococcal Polysaccharide-23 07/13/2012, 07/24/2013  . Tdap 12/10/2017  Family History  Problem Relation Age of Onset  . Asthma Mother   . Anemia Mother   . Polymyalgia rheumatica Mother   . COPD Father   . Pulmonary fibrosis Father   . Breast cancer Maternal Grandmother 68     Current Outpatient Medications:  .  albuterol (PROAIR HFA) 108 (90 Base) MCG/ACT inhaler, Inhale 2 puffs into the lungs every 4 (four) hours as needed for wheezing or shortness of breath., Disp: 1 Inhaler, Rfl: 3 .  albuterol (PROVENTIL) (2.5 MG/3ML) 0.083% nebulizer solution, Inhale 3 mLs (2.5 mg total) into the lungs every 4 (four) hours as needed for wheezing or shortness of breath., Disp: 75 vial, Rfl: 3 .  amitriptyline (ELAVIL) 25 MG tablet, Take 25 mg by mouth at bedtime., Disp: , Rfl: 0 .  beta carotene w/minerals (OCUVITE) tablet, Take 1 tablet by mouth daily., Disp: , Rfl:  .  cholecalciferol (VITAMIN D) 1000 UNITS tablet, Take 1,000 Units by mouth daily., Disp: , Rfl:  .  dextromethorphan (DELSYM) 30 MG/5ML liquid, Take 2.5 mLs (15 mg total) by mouth at bedtime. (Patient taking differently: Take 15 mg by mouth as needed. ), Disp: 89 mL, Rfl: 0 .  gabapentin  (NEURONTIN) 300 MG capsule, Take 600 mg by mouth at bedtime., Disp: , Rfl:  .  levalbuterol (XOPENEX) 0.63 MG/3ML nebulizer solution, Inhale 0.63 mg into the lungs 2 (two) times daily as needed for wheezing or shortness of breath. , Disp: , Rfl:  .  losartan (COZAAR) 25 MG tablet, Take 25 mg by mouth daily., Disp: , Rfl: 11 .  methylPREDNISolone (MEDROL DOSEPAK) 4 MG TBPK tablet, Take by mouth. tapering, Disp: , Rfl:  .  mometasone-formoterol (DULERA) 100-5 MCG/ACT AERO, Inhale 2 puffs into the lungs 2 (two) times daily., Disp: 1 Inhaler, Rfl: 11 .  pantoprazole (PROTONIX) 40 MG tablet, Take 1 tablet (40 mg total) by mouth 2 (two) times daily before a meal., Disp: 60 tablet, Rfl: 0 .  polyethylene glycol (MIRALAX / GLYCOLAX) packet, Take 17 g by mouth daily. (Patient taking differently: Take 17 g by mouth daily as needed (For constipation.). ), Disp: 14 each, Rfl: 0 .  venlafaxine XR (EFFEXOR-XR) 75 MG 24 hr capsule, Take 75 mg by mouth at bedtime. , Disp: , Rfl:       Objective:   Vitals:   06/07/18 1414  BP: 128/70  Pulse: 86  SpO2: 96%  Weight: 141 lb (64 kg)  Height: 5' 3.5" (1.613 m)    Estimated body mass index is 24.59 kg/m as calculated from the following:   Height as of this encounter: 5' 3.5" (1.613 m).   Weight as of this encounter: 141 lb (64 kg).  _0 @  Filed Weights   06/07/18 1414  Weight: 141 lb (64 kg)     Physical Exam  General Appearance:    Alert, cooperative, no distress, appears stated age -older, Deconditioned looking -no, OBESE  -no, Sitting on Wheelchair -no but has a walker  Head:    Normocephalic, without obvious abnormality, atraumatic  Eyes:    PERRL, conjunctiva/corneas clear,  Ears:    Normal TM's and external ear canals, both ears  Nose:   Nares normal, septum midline, mucosa normal, no drainage    or sinus tenderness. OXYGEN ON  -no. Patient is _1  air  Throat:   Lips, mucosa, and tongue normal; teeth and gums normal. Cyanosis on  lips -no  Neck:   Supple, symmetrical, trachea midline, no adenopathy;  thyroid:  no enlargement/tenderness/nodules; no carotid   bruit or JVD  Back:     Symmetric, no curvature, ROM normal, no CVA tenderness  Lungs:     Distress -no, Wheeze yes squeaky chest especially in the lung base and apically, Barrell Chest -no, Purse lip breathing -no, Crackles -yes in the lung base  Chest Wall:    No tenderness or deformity.    Heart:    Regular rate and rhythm, S1 and S2 normal, no rub   or gallop, Murmur -no  Breast Exam:    NOT DONE  Abdomen:     Soft, non-tender, bowel sounds active all four quadrants,    no masses, no organomegaly. Visceral obesity -no  Genitalia:   NOT DONE  Rectal:   NOT DONE  Extremities:   Extremities - normal, Has Cane -no, Clubbing -no, Edema -no  Pulses:   2+ and symmetric all extremities  Skin:   Stigmata of Connective Tissue Disease -yes has stigmata of rheumatoid arthritis  Lymph nodes:   Cervical, supraclavicular, and axillary nodes normal  Psychiatric:  Neurologic:   Pleasant -yes, Anxious -no, Flat affect -no  CAm-ICU - neg, Alert and Oriented x 3 - yes, Moves all 4s - yes, Speech - normal, Cognition - intact           Assessment:       ICD-10-CM   1. Acute bronchitis, unspecified organism J20.9   2. 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 Rheumatoid arthritis involving multiple sites, unspecified rheumatoid factor presence (HCC) M06.9   4. Asthma, unspecified asthma severity, unspecified whether complicated, unspecified whether persistent J45.909        Plan:     Patient Instructions   Acute bronchitis, unspecified organism Asthma, unspecified asthma severity, unspecified whether complicated, unspecified whether persistent  - do medrol 24m daily x 2 days, then 17mdaily x 2 days, then 66m22maily x 2 days and then baseline 4mg266mternating with 2mg 79mTake doxycycline 100mg 31mwice  daily x 5 days; take after meals and avoid sunlight - contnue dulera as before  - ok for delsym - use albuterol or xopenex as needed   Interstitial lung disease due to connective tissue disease (HCC) Rheumatoid arthritis involving multiple sites, unspecified rheumatoid factor presence (HCC)  Hessmerdo not think orencia is a good option for you given another respiratory infection  - Talk about other options such as Rituxan with Dr RogersStann Mainland future I Can consider Ofev for you ; new evidence that it works in ILD not related to IPF - do spirometry (no dlco, no lung vol, no bd response) in 6 weeks  Followup  ILD clinic in 6 weeks after spirometry        SIGNATURE    Dr. MuraliBrand Males, F.C.C.P,  Pulmonary and Critical Care Medicine Staff Physician, [facility] - Interstitial Lung Disease  Program  Pulmonary [id number]: [phone] answer or between  15:00h - 7:00h: call [phone] Telephone: [phone]  2:54 PM 06/07/2018

## 2018-06-07 NOTE — Progress Notes (Signed)
Spirometry and Dlco done today. 

## 2018-06-07 NOTE — Patient Instructions (Addendum)
  Acute bronchitis, unspecified organism Asthma, unspecified asthma severity, unspecified whether complicated, unspecified whether persistent  - do medrol 32mg  daily x 2 days, then 16mg  daily x 2 days, then 8mg  daily x 2 days and then baseline 4mg  alternating with 2mg   - Take doxycycline 100mg  po twice daily x 5 days; take after meals and avoid sunlight - contnue dulera as before  - ok for delsym - use albuterol or xopenex as needed   Interstitial lung disease due to connective tissue disease ([place]) Rheumatoid arthritis involving multiple sites, unspecified rheumatoid factor presence ([place])   - do not think orencia is a good option for you given another respiratory infection  - Talk about other options such as Rituxan with Dr Stann Mainland  - in future I Can consider Ofev for you ; new evidence that it works in ILD not related to IPF - do spirometry (no dlco, no lung vol, no bd response) in 6 weeks  Followup  ILD clinic in 6 weeks after spirometry

## 2018-06-14 DIAGNOSIS — M859 Disorder of bone density and structure, unspecified: Secondary | ICD-10-CM | POA: Diagnosis not present

## 2018-06-14 DIAGNOSIS — R82998 Other abnormal findings in urine: Secondary | ICD-10-CM | POA: Diagnosis not present

## 2018-06-14 DIAGNOSIS — I10 Essential (primary) hypertension: Secondary | ICD-10-CM | POA: Diagnosis not present

## 2018-06-14 DIAGNOSIS — E7849 Other hyperlipidemia: Secondary | ICD-10-CM | POA: Diagnosis not present

## 2018-06-16 ENCOUNTER — Ambulatory Visit (AMBULATORY_SURGERY_CENTER): Payer: Medicare Other | Admitting: Gastroenterology

## 2018-06-16 ENCOUNTER — Encounter: Payer: Self-pay | Admitting: Gastroenterology

## 2018-06-16 VITALS — BP 138/68 | HR 68 | Temp 96.9°F | Resp 14 | Ht 63.0 in | Wt 141.0 lb

## 2018-06-16 DIAGNOSIS — R1310 Dysphagia, unspecified: Secondary | ICD-10-CM

## 2018-06-16 DIAGNOSIS — K2950 Unspecified chronic gastritis without bleeding: Secondary | ICD-10-CM | POA: Diagnosis not present

## 2018-06-16 DIAGNOSIS — K219 Gastro-esophageal reflux disease without esophagitis: Secondary | ICD-10-CM | POA: Diagnosis not present

## 2018-06-16 DIAGNOSIS — I10 Essential (primary) hypertension: Secondary | ICD-10-CM | POA: Diagnosis not present

## 2018-06-16 DIAGNOSIS — K2990 Gastroduodenitis, unspecified, without bleeding: Principal | ICD-10-CM

## 2018-06-16 DIAGNOSIS — K449 Diaphragmatic hernia without obstruction or gangrene: Secondary | ICD-10-CM | POA: Diagnosis not present

## 2018-06-16 DIAGNOSIS — K2970 Gastritis, unspecified, without bleeding: Secondary | ICD-10-CM

## 2018-06-16 MED ORDER — SODIUM CHLORIDE 0.9 % IV SOLN: 500.0000 mL | Freq: Once | INTRAVENOUS | Status: DC

## 2018-06-16 NOTE — Patient Instructions (Signed)
See dilation diet, GERD protocol, gastritis handouts. NO NSAIDS INCLUDING ALEVE, IBUPROFEN, ASPIRIN, ETC.  TYLENOL ONLY FOR OTC PAIN RELIEF.   U HAD AN ENDOSCOPIC PROCEDURE TODAY AT [facility]:   Refer to the procedure report that was given to you for any specific questions about what was found during the examination.  If the procedure report does not answer your questions, please call your gastroenterologist to clarify.  If you requested that your care partner not be given the details of your procedure findings, then the procedure report has been included in a sealed envelope for you to review at your convenience later.  YOU SHOULD EXPECT: Some feelings of bloating in the abdomen. Passage of more gas than usual.  Walking can help get rid of the air that was put into your GI tract during the procedure and reduce the bloating. If you had a lower endoscopy (such as a colonoscopy or flexible sigmoidoscopy) you may notice spotting of blood in your stool or on the toilet paper. If you underwent a bowel prep for your procedure, you may not have a normal bowel movement for a few days.  Please Note:  You might notice some irritation and congestion in your nose or some drainage.  This is from the oxygen used during your procedure.  There is no need for concern and it should clear up in a day or so.  SYMPTOMS TO REPORT IMMEDIATELY:    Following upper endoscopy (EGD)  Vomiting of blood or coffee ground material  New chest pain or pain under the shoulder blades  Painful or persistently difficult swallowing  New shortness of breath  Fever of 100F or higher  Black, tarry-looking stools  For urgent or emergent issues, a gastroenterologist can be reached at any hour by calling [phone].   DIET:  We do recommend a small meal at first, but then you may proceed to your regular diet.  Drink plenty of fluids but you should avoid alcoholic beverages for 24 hours.  ACTIVITY:  You should  plan to take it easy for the rest of today and you should NOT DRIVE or use heavy machinery until tomorrow (because of the sedation medicines used during the test).    FOLLOW UP: Our staff will call the number listed on your records the next business day following your procedure to check on you and address any questions or concerns that you may have regarding the information given to you following your procedure. If we do not reach you, we will leave a message.  However, if you are feeling well and you are not experiencing any problems, there is no need to return our call.  We will assume that you have returned to your regular daily activities without incident.  If any biopsies were taken you will be contacted by phone or by letter within the next 1-3 weeks.  Please call us at [phone] if you have not heard about the biopsies in 3 weeks.    SIGNATURES/CONFIDENTIALITY: You and/or your care partner have signed paperwork which will be entered into your electronic medical record.  These signatures attest to the fact that that the information above on your After Visit Summary has been reviewed and is understood.  Full responsibility of the confidentiality of this discharge information lies with you and/or your care-partner.

## 2018-06-16 NOTE — Progress Notes (Signed)
Called to room to assist during endoscopic procedure.  Patient ID and intended procedure confirmed with present staff. Received instructions for my participation in the procedure from the performing physician.  

## 2018-06-16 NOTE — Progress Notes (Signed)
Pt states she has had no changes in health history other than having bronchitis and finishing antibiotics since pre visit.

## 2018-06-16 NOTE — Op Note (Signed)
[place] Patient Name: Stacey [name] Procedure Date: 06/16/2018 10:17 AM MRN: [id number] Endoscopist: Mauri Pole , MD Age: 77 Referring MD:  Date of Birth: [birth year] Gender: Female Account #: [id number] Procedure:                Upper GI endoscopy Indications:              Dysphagia Medicines:                Monitored Anesthesia Care Procedure:                Pre-Anesthesia Assessment:                           - Prior to the procedure, a History and Physical                            was performed, and patient medications and                            allergies were reviewed. The patient's tolerance of                            previous anesthesia was also reviewed. The risks                            and benefits of the procedure and the sedation                            options and risks were discussed with the patient.                            All questions were answered, and informed consent                            was obtained. Prior Anticoagulants: The patient has                            taken no previous anticoagulant or antiplatelet                            agents. ASA Grade Assessment: III - A patient with                            severe systemic disease. After reviewing the risks                            and benefits, the patient was deemed in                            satisfactory condition to undergo the procedure.                           After obtaining informed consent, the endoscope was  passed under direct vision. Throughout the                            procedure, the patient's blood pressure, pulse, and                            oxygen saturations were monitored continuously. The                            Model GIF-HQ190 [id number]) scope was introduced                            through the mouth, and advanced to the second part                            of duodenum. The upper GI  endoscopy was                            accomplished without difficulty. The patient                            tolerated the procedure well. Scope In: Scope Out: Findings:                 The Z-line was regular and was found 38 cm from the                            incisors.                           No endoscopic abnormality was evident in the                            esophagus to explain the patient's complaint of                            dysphagia. It was decided, however, to proceed with                            dilation of the lower third of the esophagus. A TTS                            dilator was passed through the scope. Dilation with                            an 18-19-20 mm balloon dilator was performed to 20                            mm.                           A small hiatal hernia was present.                           Patchy mild inflammation with  hemorrhage                            characterized by congestion (edema), erythema and                            mucus was found in the entire examined stomach.                            Biopsies were taken with a cold forceps for                            Helicobacter pylori testing.                           The examined duodenum was normal. Complications:            No immediate complications. Estimated Blood Loss:     Estimated blood loss was minimal. Impression:               - Z-line regular, 38 cm from the incisors.                           - No endoscopic esophageal abnormality to explain                            patient's dysphagia. Esophagus dilated. Dilated.                           - Small hiatal hernia.                           - Gastritis with hemorrhage. Biopsied.                           - Normal examined duodenum. Recommendation:           - Patient has a contact number available for                            emergencies. The signs and symptoms of potential                             delayed complications were discussed with the                            patient. Return to normal activities tomorrow.                            Written discharge instructions were provided to the                            patient.                           - Resume previous diet.                           -  Continue present medications.                           - Await pathology results.                           - No ibuprofen, naproxen, or other non-steroidal                            anti-inflammatory drugs.                           - Return to my office at the next available                            appointment. Mauri Pole, MD 06/16/2018 10:37:40 AM This report has been signed electronically.

## 2018-06-16 NOTE — Progress Notes (Signed)
Report given to PACU, vss 

## 2018-06-17 ENCOUNTER — Telehealth: Payer: Self-pay | Admitting: *Deleted

## 2018-06-17 NOTE — Telephone Encounter (Signed)
  Follow up Call-  Call back number 06/16/2018  Post procedure Call Back phone  # [phone]  Permission to leave phone message Yes  Some recent data might be hidden     Patient questions:  Do you have a fever, pain , or abdominal swelling? No. Pain Score  0 *  Have you tolerated food without any problems? Yes.    Have you been able to return to your normal activities? Yes.    Do you have any questions about your discharge instructions: Diet   No. Medications  No. Follow up visit  No.  Do you have questions or concerns about your Care? No.  Actions: * If pain score is 4 or above: No action needed, pain <4.

## 2018-06-21 DIAGNOSIS — Z6822 Body mass index (BMI) 22.0-22.9, adult: Secondary | ICD-10-CM | POA: Diagnosis not present

## 2018-06-21 DIAGNOSIS — J449 Chronic obstructive pulmonary disease, unspecified: Secondary | ICD-10-CM | POA: Diagnosis not present

## 2018-06-21 DIAGNOSIS — Z0000 Encounter for general adult medical examination without abnormal findings: Secondary | ICD-10-CM | POA: Diagnosis not present

## 2018-06-21 DIAGNOSIS — D692 Other nonthrombocytopenic purpura: Secondary | ICD-10-CM | POA: Diagnosis not present

## 2018-06-21 DIAGNOSIS — J40 Bronchitis, not specified as acute or chronic: Secondary | ICD-10-CM | POA: Diagnosis not present

## 2018-06-21 DIAGNOSIS — D8989 Other specified disorders involving the immune mechanism, not elsewhere classified: Secondary | ICD-10-CM | POA: Diagnosis not present

## 2018-06-21 DIAGNOSIS — I700 Atherosclerosis of aorta: Secondary | ICD-10-CM | POA: Diagnosis not present

## 2018-06-21 DIAGNOSIS — M0510 Rheumatoid lung disease with rheumatoid arthritis of unspecified site: Secondary | ICD-10-CM | POA: Diagnosis not present

## 2018-06-21 DIAGNOSIS — J9691 Respiratory failure, unspecified with hypoxia: Secondary | ICD-10-CM | POA: Diagnosis not present

## 2018-06-21 DIAGNOSIS — M069 Rheumatoid arthritis, unspecified: Secondary | ICD-10-CM | POA: Diagnosis not present

## 2018-06-21 DIAGNOSIS — I872 Venous insufficiency (chronic) (peripheral): Secondary | ICD-10-CM | POA: Diagnosis not present

## 2018-06-21 DIAGNOSIS — Z1389 Encounter for screening for other disorder: Secondary | ICD-10-CM | POA: Diagnosis not present

## 2018-06-22 ENCOUNTER — Other Ambulatory Visit: Payer: Self-pay

## 2018-06-22 DIAGNOSIS — Z1212 Encounter for screening for malignant neoplasm of rectum: Secondary | ICD-10-CM | POA: Diagnosis not present

## 2018-06-24 DIAGNOSIS — M85841 Other specified disorders of bone density and structure, right hand: Secondary | ICD-10-CM | POA: Diagnosis not present

## 2018-06-24 DIAGNOSIS — Z1159 Encounter for screening for other viral diseases: Secondary | ICD-10-CM | POA: Diagnosis not present

## 2018-06-24 DIAGNOSIS — R0609 Other forms of dyspnea: Secondary | ICD-10-CM | POA: Diagnosis not present

## 2018-06-24 DIAGNOSIS — J849 Interstitial pulmonary disease, unspecified: Secondary | ICD-10-CM | POA: Diagnosis not present

## 2018-06-24 DIAGNOSIS — J40 Bronchitis, not specified as acute or chronic: Secondary | ICD-10-CM | POA: Diagnosis not present

## 2018-06-24 DIAGNOSIS — Z7289 Other problems related to lifestyle: Secondary | ICD-10-CM | POA: Diagnosis not present

## 2018-06-24 DIAGNOSIS — M069 Rheumatoid arthritis, unspecified: Secondary | ICD-10-CM | POA: Diagnosis not present

## 2018-06-24 DIAGNOSIS — M0579 Rheumatoid arthritis with rheumatoid factor of multiple sites without organ or systems involvement: Secondary | ICD-10-CM | POA: Diagnosis not present

## 2018-06-24 DIAGNOSIS — R918 Other nonspecific abnormal finding of lung field: Secondary | ICD-10-CM | POA: Diagnosis not present

## 2018-06-24 DIAGNOSIS — K219 Gastro-esophageal reflux disease without esophagitis: Secondary | ICD-10-CM | POA: Diagnosis not present

## 2018-06-27 ENCOUNTER — Encounter: Payer: Self-pay | Admitting: Gastroenterology

## 2018-07-15 ENCOUNTER — Telehealth: Payer: Self-pay | Admitting: Internal Medicine

## 2018-07-15 ENCOUNTER — Other Ambulatory Visit: Payer: Medicare Other

## 2018-07-15 DIAGNOSIS — M358 Other specified systemic involvement of connective tissue: Secondary | ICD-10-CM

## 2018-07-15 DIAGNOSIS — J8489 Other specified interstitial pulmonary diseases: Secondary | ICD-10-CM

## 2018-07-15 DIAGNOSIS — M069 Rheumatoid arthritis, unspecified: Secondary | ICD-10-CM

## 2018-07-15 NOTE — Telephone Encounter (Signed)
Fax has not been received yet from [facility].  Called and spoke with pt letting her know that we had not received the fax yet. I asked pt if maybe they had been trying to fax it to our old fax number and pt stated that might be what has been happening. I provided pt with our new fax number.  Pt stated to me that [facility] is about to start her on a new med of Rituxan and she stated they were wanting her to have labs done prior to starting on the med, which she is supposed to be started on it 07/20/18.  I stated to pt as soon as we had received the fax of the orders, we would check to see if it would be okay for her to come to office to have labs done at our office and then call her back once we have more info. Pt expressed understanding. Will await fax.

## 2018-07-18 ENCOUNTER — Ambulatory Visit (INDEPENDENT_AMBULATORY_CARE_PROVIDER_SITE_OTHER): Payer: Medicare Other | Admitting: Gastroenterology

## 2018-07-18 ENCOUNTER — Encounter: Payer: Self-pay | Admitting: Gastroenterology

## 2018-07-18 ENCOUNTER — Other Ambulatory Visit (INDEPENDENT_AMBULATORY_CARE_PROVIDER_SITE_OTHER): Payer: Medicare Other

## 2018-07-18 VITALS — BP 126/62 | HR 62 | Ht 66.0 in | Wt 140.0 lb

## 2018-07-18 DIAGNOSIS — K219 Gastro-esophageal reflux disease without esophagitis: Secondary | ICD-10-CM | POA: Diagnosis not present

## 2018-07-18 DIAGNOSIS — R1319 Other dysphagia: Secondary | ICD-10-CM | POA: Diagnosis not present

## 2018-07-18 DIAGNOSIS — M069 Rheumatoid arthritis, unspecified: Secondary | ICD-10-CM | POA: Diagnosis not present

## 2018-07-18 LAB — CBC WITH DIFFERENTIAL/PLATELET
Basophils Absolute: 0.1 10*3/uL (ref 0.0–0.1)
Basophils Relative: 1.1 % (ref 0.0–3.0)
Eosinophils Absolute: 0.3 10*3/uL (ref 0.0–0.7)
Eosinophils Relative: 2.7 % (ref 0.0–5.0)
HCT: 39.6 % (ref 36.0–46.0)
Hemoglobin: 13 g/dL (ref 12.0–15.0)
Lymphocytes Relative: 29.7 % (ref 12.0–46.0)
Lymphs Abs: 2.8 10*3/uL (ref 0.7–4.0)
MCHC: 32.9 g/dL (ref 30.0–36.0)
MCV: 88.3 fl (ref 78.0–100.0)
Monocytes Absolute: 0.6 10*3/uL (ref 0.1–1.0)
Monocytes Relative: 6.8 % (ref 3.0–12.0)
Neutro Abs: 5.7 10*3/uL (ref 1.4–7.7)
Neutrophils Relative %: 59.7 % (ref 43.0–77.0)
Platelets: 260 10*3/uL (ref 150.0–400.0)
RBC: 4.49 Mil/uL (ref 3.87–5.11)
RDW: 15 % (ref 11.5–15.5)
WBC: 9.5 10*3/uL (ref 4.0–10.5)

## 2018-07-18 LAB — ALT: ALT: 16 U/L (ref 0–35)

## 2018-07-18 LAB — C-REACTIVE PROTEIN: CRP: 0.4 mg/dL — ABNORMAL LOW (ref 0.5–20.0)

## 2018-07-18 LAB — SEDIMENTATION RATE: Sed Rate: 35 mm/hr — ABNORMAL HIGH (ref 0–30)

## 2018-07-18 LAB — AST: AST: 21 U/L (ref 0–37)

## 2018-07-18 MED ORDER — PANTOPRAZOLE SODIUM 40 MG PO TBEC: 40.0000 mg | DELAYED_RELEASE_TABLET | Freq: Two times a day (BID) | ORAL | 3 refills | Status: DC

## 2018-07-18 NOTE — Progress Notes (Signed)
Stacey [name]    [id number]    12-Dec-1940  Primary Care Physician:Russo, [name], MD  Referring Physician: Shon Baton, MD [address] [place], [place] [address]  Chief complaint: GERD  HPI: 78 year old female with history of rheumatoid arthritis and interstitial lung disease here for follow-up visit. Dysphagia and GERD symptoms significantly improved with Protonix twice daily and after EGD with esophageal dilation He has occasional heartburn once every few weeks specially when she eats heavy meals or late dinner.  Denies any dysphagia.  Overall doing much better. Denies any nausea, vomiting, abdominal pain, melena or bright red blood per rectum   Relevant GI Hx: Barium esophagram July 2019 showed gastroesophageal reflux.  13 mm pill lodged at the EG junction transiently.  On modified barium swallow September 2019 barium tablet was again lodged at the EG junction and did not clear with 2 extra boluses of thin liquid. EGD June 16, 2018 dilated esophagus with TTS balloon to 20 mm, small hiatal hernia and gastritis.  Biopsies negative for H. pylori  Outpatient Encounter Medications as of 07/18/2018  Medication Sig  . albuterol (PROAIR HFA) 108 (90 Base) MCG/ACT inhaler Inhale 2 puffs into the lungs every 4 (four) hours as needed for wheezing or shortness of breath.  Marland Kitchen albuterol (PROVENTIL) (2.5 MG/3ML) 0.083% nebulizer solution Inhale 3 mLs (2.5 mg total) into the lungs every 4 (four) hours as needed for wheezing or shortness of breath.  Marland Kitchen amitriptyline (ELAVIL) 25 MG tablet Take 25 mg by mouth at bedtime.  . beta carotene w/minerals (OCUVITE) tablet Take 1 tablet by mouth daily.  . cholecalciferol (VITAMIN D) 1000 UNITS tablet Take 1,000 Units by mouth daily.  Marland Kitchen dextromethorphan (DELSYM) 30 MG/5ML liquid Take 2.5 mLs (15 mg total) by mouth at bedtime. (Patient taking differently: Take 15 mg by mouth as needed. )  . doxycycline (VIBRA-TABS) 100 MG tablet Take 1 tablet  (100 mg total) by mouth 2 (two) times daily. (Patient not taking: Reported on 06/16/2018)  . gabapentin (NEURONTIN) 300 MG capsule Take 600 mg by mouth at bedtime.  . levalbuterol (XOPENEX) 0.63 MG/3ML nebulizer solution Inhale 0.63 mg into the lungs 2 (two) times daily as needed for wheezing or shortness of breath.   . losartan (COZAAR) 25 MG tablet Take 25 mg by mouth daily.  . methylPREDNISolone (MEDROL DOSEPAK) 4 MG TBPK tablet Take by mouth. tapering  . methylPREDNISolone (MEDROL) 8 MG tablet Take 4 tablets daily x 2 days, then 2 tablets daily x 2 days, then 1 tablet daily x 2 days  . mometasone-formoterol (DULERA) 100-5 MCG/ACT AERO Inhale 2 puffs into the lungs 2 (two) times daily.  . pantoprazole (PROTONIX) 40 MG tablet Take 1 tablet (40 mg total) by mouth 2 (two) times daily before a meal.  . polyethylene glycol (MIRALAX / GLYCOLAX) packet Take 17 g by mouth daily. (Patient taking differently: Take 17 g by mouth daily as needed (For constipation.). )  . venlafaxine XR (EFFEXOR-XR) 75 MG 24 hr capsule Take 75 mg by mouth at bedtime.    No facility-administered encounter medications on file as of 07/18/2018.     Allergies as of 07/18/2018 - Review Complete 06/16/2018  Allergen Reaction Noted  . Penicillins Other (See Comments) 02/09/2006  . Remicade [infliximab] Other (See Comments) 08/20/2015  . Sulfa antibiotics Other (See Comments) 05/12/2006  . Sulfamethoxazole Other (See Comments) 01/08/2014    Past Medical History:  Diagnosis Date  . Abnormal finding of  blood chemistry   . Asthma   . H/O measles   . H/O varicella   . Hypertension   . 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[place])   . 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 Lichen sclerosus 44/01/02   Asymptomatic  . Low iron   . Mitral valve prolapse   . Osteoarthritis   . Osteoporosis   . Pneumonia   . Post herpetic neuralgia   . Rheumatoid arthritis(714.0)   . Yeast infection     Past Surgical History:  Procedure Laterality Date  .  CHOLECYSTECTOMY  2011  . WISDOM TOOTH EXTRACTION      Family History  Problem Relation Age of Onset  . Asthma Mother   . Anemia Mother   . Polymyalgia rheumatica Mother   . COPD Father   . Pulmonary fibrosis Father   . Breast cancer Maternal Grandmother 88    Social History   Socioeconomic History  . Marital status: Married    Spouse name: Not on file  . Number of children: 2  . Years of education: BA  . Highest education level: Not on file  Occupational History  . Occupation: Retired  Scientific laboratory technician  . Financial resource strain: Not on file  . Food insecurity:    Worry: Not on file    Inability: Not on file  . Transportation needs:    Medical: Not on file    Non-medical: Not on file  Tobacco Use  . Smoking status: Never Smoker  . Smokeless tobacco: Never Used  Substance and Sexual Activity  . Alcohol use: No  . Drug use: No  . Sexual activity: Yes  Lifestyle  . Physical activity:    Days per week: Not on file    Minutes per session: Not on file  . Stress: Not on file  Relationships  . Social connections:    Talks on phone: Not on file    Gets together: Not on file    Attends religious service: Not on file    Active member of club or organization: Not on file    Attends meetings of clubs or organizations: Not on file    Relationship status: Not on file  . Intimate partner violence:    Fear of current or ex partner: Not on file    Emotionally abused: Not on file    Physically abused: Not on file    Forced sexual activity: Not on file  Other Topics Concern  . Not on file  Social History Narrative   Drinks 1 cup of coffee and 1 diet coke a day       Review of systems: Review of Systems  Constitutional: Negative for fever and chills.  HENT: Negative.   Eyes: Negative for blurred vision.  Respiratory: positive for cough, shortness of breath and wheezing.   Cardiovascular: Negative for chest pain and palpitations.  Gastrointestinal: as per  HPI Genitourinary: Negative for dysuria, urgency, frequency and hematuria.  Musculoskeletal: Negative for myalgias, back pain and joint pain.  Skin: Negative for itching and rash.  Neurological: Negative for dizziness, tremors, focal weakness, seizures and loss of consciousness.  Endo/Heme/Allergies: Negative for seasonal allergies.  Psychiatric/Behavioral: Negative for depression, suicidal ideas and hallucinations.  All other systems reviewed and are negative.   Physical Exam: Vitals:   07/18/18 1501  BP: 126/62  Pulse: 62   Body mass index is 22.6 kg/m. Gen:      No acute distress HEENT:  EOMI, sclera anicteric Neck:     No  masses; no thyromegaly Lungs:    Clear to auscultation bilaterally; normal respiratory effort CV:         Regular rate and rhythm; no murmurs Abd:      + bowel sounds; soft, non-tender; no palpable masses, no distension Ext:    No edema; adequate peripheral perfusion Skin:      Warm and dry; no rash Neuro: alert and oriented x 3 Psych: normal mood and affect  Data Reviewed:  Reviewed labs, radiology imaging, old records and pertinent past GI work up   Assessment and Plan/Recommendations:  78 year old female with history of rheumatoid arthritis, asthma, ILD, 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 Greater than 50% of the time used for counseling as well as treatment plan and follow-up. She had multiple questions which were answered to her satisfaction  K. Denzil Magnuson , MD [phone]    CC: Shon Baton, MD

## 2018-07-18 NOTE — Telephone Encounter (Signed)
This was received today, 07/18/18.  MR is out of the office until 07/19/18 so when he returns to office then, we will check with him to see if pt can come by our office to get labs done. Called and spoke with pt letting her know that MR will return tomorrow and that we will check with him tomorrow, 07/19/18 to see if she can come to get labwork done at our office.  Labs are from [facility] that need to be done prior to pt beginning Rituxan.  The labs that need to be done are:  -Hepatitis C Antibody -Hepatitis B Surface Antigen -Quantiferon TB Gold Plus -CBC with Diff -Alanine Aminotransferase (ALT) -Aspartate Aminotransferase (AST) -Sedimentation Rate-Automated -C-Reactive Protein (CRP), Inflammatory  I do have the fax of all the labs that need to be done.  MR, please advise if it is okay for pt to come to our office to get labs performed as our office is closer for her than [facility].

## 2018-07-18 NOTE — Telephone Encounter (Signed)
That is fine . So, please order. She can do the labs when she vsiits with me on 07/21/2018

## 2018-07-18 NOTE — Patient Instructions (Signed)
Continue Protonix twice daily we will refill for 1 year  Take Gaviscon 1 tablet after meals as needed  Follow up in 1 year  If you are age 79 or older, your body mass index should be between 23-30. Your Body mass index is 22.6 kg/m. If this is out of the aforementioned range listed, please consider follow up with your Primary Care Provider.  If you are age 3 or younger, your body mass index should be between 19-25. Your Body mass index is 22.6 kg/m. If this is out of the aformentioned range listed, please consider follow up with your Primary Care Provider.    Thank you for choosing [facility]  Karleen [place] Nandigam,MD

## 2018-07-18 NOTE — Telephone Encounter (Signed)
Stacey [name] - please advise if this has been received. I checked MR's inbox up front and it's not located there.

## 2018-07-18 NOTE — Telephone Encounter (Signed)
Labs actually need to be done before OV on 07/21/18 as pt is supposed to be beginning Rituxan at [facility] 07/20/18.  Orders have been placed for pt to have labwork done. Called pt to let her know that she could come to our office today to get labwork done. Pt expressed understanding.  Called [facility] but unable to speak to anyone. Left a detailed message for them stating that pt was coming to office today, 07/18/18 to get labwork done and also asked for them to return call so we can know that they have received the message and are aware that she is going to get them done.

## 2018-07-20 DIAGNOSIS — R0609 Other forms of dyspnea: Secondary | ICD-10-CM | POA: Diagnosis not present

## 2018-07-20 DIAGNOSIS — R0600 Dyspnea, unspecified: Secondary | ICD-10-CM | POA: Diagnosis not present

## 2018-07-20 DIAGNOSIS — R05 Cough: Secondary | ICD-10-CM | POA: Diagnosis not present

## 2018-07-20 DIAGNOSIS — J849 Interstitial pulmonary disease, unspecified: Secondary | ICD-10-CM | POA: Diagnosis not present

## 2018-07-20 DIAGNOSIS — R918 Other nonspecific abnormal finding of lung field: Secondary | ICD-10-CM | POA: Diagnosis not present

## 2018-07-20 LAB — QUANTIFERON-TB GOLD PLUS
Mitogen-NIL: 7.24 IU/mL
NIL: 0.19 IU/mL
QuantiFERON-TB Gold Plus: NEGATIVE
TB1-NIL: 0.25 IU/mL
TB2-NIL: 0.2 IU/mL

## 2018-07-20 LAB — HEPATITIS B SURFACE ANTIGEN: Hepatitis B Surface Ag: NONREACTIVE

## 2018-07-20 LAB — HEPATITIS C ANTIBODY
Hepatitis C Ab: NONREACTIVE
SIGNAL TO CUT-OFF: 0.02 (ref <1.00)

## 2018-07-21 ENCOUNTER — Encounter: Payer: Self-pay | Admitting: Internal Medicine

## 2018-07-21 ENCOUNTER — Other Ambulatory Visit: Payer: Self-pay | Admitting: Internal Medicine

## 2018-07-21 ENCOUNTER — Ambulatory Visit (INDEPENDENT_AMBULATORY_CARE_PROVIDER_SITE_OTHER): Payer: Medicare Other | Admitting: Internal Medicine

## 2018-07-21 VITALS — BP 138/78 | HR 74 | Ht 65.0 in | Wt 140.0 lb

## 2018-07-21 DIAGNOSIS — J45909 Unspecified asthma, uncomplicated: Secondary | ICD-10-CM | POA: Diagnosis not present

## 2018-07-21 DIAGNOSIS — M359 Systemic involvement of connective tissue, unspecified: Secondary | ICD-10-CM

## 2018-07-21 DIAGNOSIS — M358 Other specified systemic involvement of connective tissue: Secondary | ICD-10-CM

## 2018-07-21 DIAGNOSIS — J8489 Other specified interstitial pulmonary diseases: Secondary | ICD-10-CM

## 2018-07-21 DIAGNOSIS — R0609 Other forms of dyspnea: Secondary | ICD-10-CM | POA: Diagnosis not present

## 2018-07-21 LAB — PULMONARY FUNCTION TEST
FEF 25-75 Pre: 1.48 L/sec
FEF2575-%Pred-Pre: 94 %
FEV1-%Pred-Pre: 81 %
FEV1-Pre: 1.72 L
FEV1FVC-%Pred-Pre: 106 %
FEV6-%Pred-Pre: 81 %
FEV6-Pre: 2.18 L
FEV6FVC-%Pred-Pre: 105 %
FVC-%Pred-Pre: 77 %
FVC-Pre: 2.18 L
Pre FEV1/FVC ratio: 79 %
Pre FEV6/FVC Ratio: 100 %

## 2018-07-21 NOTE — Addendum Note (Signed)
Addended by: Lorretta Harp on: 07/21/2018 12:18 PM   Modules accepted: Orders

## 2018-07-21 NOTE — Progress Notes (Signed)
Patient completed Arlyce Harman only per MR.

## 2018-07-21 NOTE — Patient Instructions (Addendum)
ICD-10-CM   1. 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 Asthma, unspecified asthma severity, unspecified whether complicated, unspecified whether persistent J45.909   3. DOE (dyspnea on exertion) R06.09    In my view you have a combination of asthma and interstitial lung disease This contributes to shortness of breath on exertion and also episodic bronchitis episodes At this point in time your lung function is improved  Plan -Based on current literature I do not see a role for anti-fibrotic therapy on your lungs -If the lung disease progresses then we could consider anti-fibrotic therapy such as nintedanib -Support plan for Rituxan with Dr. Stann Mainland at [facility] for your rheumatoid arthritis -Continue to monitor your lung function -any decline then we can consider nintedanib -I will inquire about local rheumatologist support for you  Follow-up -Spirometry and DLCO in 6 months -Return to ILD clinic in 6 months -Return sooner if any respiratory issues

## 2018-07-21 NOTE — Progress Notes (Signed)
Brief patient profile:  50 yowf  never smoker with allergies/inhalers as child outgrew by [employer] then  RA since around 2000  Prednisone  x decades and prev eval by Dr Joya Gaskins around 2004 for sob resolved s maint rx and referred 05/26/2013 by Dr Shelia Media for bronchitis and abn cxr   History of Present Illness  05/26/2013 1st [facility] office visit/ Wert cc June 2014 dx pna  In [place] and remicade stopped and 100% better and placed arencia in September 2014  then abruptly worse first week in November with cough green sputum s nasal symptoms, fever low grade and no cp or cough and completely recovered prior to [place] does not recall abx but issue is why keeps getting sick and abn CT Chest (see below).   Arthritis symptoms well controlled at present on Rx for RA rec Nexium 40 mg Take 30-60 min before first meal of the day and add pepcid 20 mg one at bedtime whenever coughing.     10/19/2017  f/u ov/Wert re:  RA lung dz  Chief Complaint  Patient presents with  . Follow-up    Cough is much improved, but has not resolved yet. Cough is non prod. She has not had to use her neb.   Dyspnea:  Not limited by breathing from desired activities  But some doe x steps Cough: daytime > noct dry  Sleep: fine  SABA use:  No saba Medrol 4 mg a/w 8m per day/ ok control of arthritis  rec Start back on gabapentin up to 300 mg each am  in addition to the the two at bedtime  If not better increase the medrol to 8 mg daily until bettter then taper back to where you      01/10/2018  f/u ov/Wert re:  RA  Lung dz Medrol  4 mg  One alternating with a half Chief Complaint  Patient presents with  . Follow-up    PFT's done. Her breathing has been gradually worsening since the last visit. She has occ cough- non prod.   Dyspnea gradually worse since last ov:  MMRC1 =  MMRC3 = can't walk 100 yards even at a slow pace at a flat grade s stopping due to sob    Gradually x 3 m / more fatigue / no change in  arthritis  Cough: not an issue rec Protonix 40 mg Take 30-60 min before first meal of the day  GERD diet   01/17/2018 acute extended ov/Wert re: cough on medrol 4 mg  One a/w one half  Chief Complaint  Patient presents with  . Acute Visit    started coughing 01/11/18- occ prod with minimal green sputum.  She states also wheezing and having increased SOB.    abruptly worse 01/11/18 with severe 24/7 coughing >>  prod min green mucus esp in am/ assoc with subjective wheeze and did not follow previous contingencies re flutter / saba/ increase medrol and admits she does not rember those written instructions nor how to use the neb provided .  No fever/ comfortable at rest sitting  rec For cough > mucinex dm 1200 mg every 12 hours and cough into the flutter valve as much as possible  Doxycycline 100 mg twice daily x 10 days with glass of water Medrol 432mx 2 now and take 2  daily until cough is better then 1 daily x 5 days and then resume the previous dose  Shortness of breath/ wheezing/ still coughing >  albuterol neb every 4 hours as needed      01/20/2018 acute extended ov/Wert re: refractory cough and sob 01/11/18 Chief Complaint  Patient presents with  . Acute Visit    she is not feeling better, coughing , very SOB, wheezing  mucus now clear/scant  on doxy/ neb machine not working (tube would not plug into the side s adequate force and she was not capable of applying it due to RA hands. Cough/ wheeze/ sob 24/7 / flutter not helping/ can't lie down at hs   rec While coughing protonix 40 mg Take 30- 60 min before your first and last meals of the day  Shortness of breath/ wheezing/ still coughing > albuterol neb every 4 hours as needed  Depomedrol 120 mg IM and medrol 32 mg daily x 2 days,  then 16 mg x 3 days,  Then 8 mg x 4 days , then resume the 4 mg daily  For severe cough > tylenol 3# one every 4 hours if needed  Go to ER if condition worsens on above plan       Date of admission:  01/22/2018             Date of discharge: 01/27/2018   History of present illness: As per the H and P dictated on admission, "[name] a78 y.o.female,w Rheumatoid arthritis, ILD Asthma, apparently c/o increase in dyspnea this evening. Dry cough. Denies fever, chills, cp, palp, N/v, diarrhea, brbpr, black stool. Pt notes recently being given steroid injection in office as well as being placed on doxycycline. This might have helped slightly but pt worse Hospital Course:  Summary of her active problems in the hospital is as following. 1 dyspnea/hypoxemia/ILD Concerned that likely GERD Is causing ILD. Patient with cough.  Patient with complaints of awakening with cough and also with oral intake which is slightly improved since 01/24/2018. assessed by speech therapy and speech therapy raising concern of esophageal component but no signs of aspiration.  2D echo with a EF of 55 to 60% with no wall motion abnormalities, grade 1 diastolic dysfunction.  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 Pulmonary felt that the patient should be on scheduled Reglan. I have placed the patient on scheduled potassium before sleep. Continue steroids on discharge continue Mucinex and Claritin as well as inhalers. Patient will follow-up with pulmonary outpatient  2. Gastroesophageal reflux disease Continue PPI and H2 blocker.I changed PPI to [facility].  3. Rheumatoid arthritis Outpatient follow-up.   4. Anxiety Continue Effexor.    All other chronic medical condition were stable during the hospitalization.  Patient was ambulatory without any assistance. On the day of the discharge the patient's vitals were stable , and no other acute medical condition were reported by patient. the patient was felt safe to be discharge at home with family.  Consultants: PCCM  Procedures: Echocardiogram      03/21/2018  f/u ov/Wert re:   S/p admit was  transiently better  and downhill since Labor day on medrol 4 mg daily  Last orencia on Sept 4th 2019  Chief Complaint  Patient presents with  . Acute Visit    Per patient, she has had a dry cough since July 2019. She has been wheezing as well. Increased fatigue. Body aches. Denies any fever or chills.   Dyspnea:  MMRC4  = sob if tries to leave home or while getting dressed   Cough: harsh/ hacking mostly dry/ has flutter not using    SABA  use: not much better with rx   No obvious day to day or daytime variability or assoc excess/ purulent sputum or mucus plugs or hemoptysis or cp or chest tightness, subjective wheeze or overt sinus or hb symptoms.     Also denies any obvious fluctuation of symptoms with weather or environmental changes or other aggravating or alleviating factors except as outlined above   No unusual exposure hx or h/o childhood pna/ asthma or knowledge of premature birth.   INpatient consult 03/26/18 78 year old with rheumatoid arthoritis.At baseline the patient lives at home with her husband and is independent of ADLs.  Has been on many immune suppressants over decades and curently on orencia x 4 year and prednisone. Does not recollect being on bactrim/dapsone. Chart mentions BOOP/MAI in 2001 but she denies this. 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 Got admitted. Rx with steroids. Per Notes - clnical suspicion of  arytenoid inflmmation related wheeze noticed (she also reports asthma NOS). Follolwup with ENT recommended (but not seen one as yet). She also appears to have passed swallow with rec for regular diet with thin liquids but did to have mild eso stricture and reflux during testing . PFT shows 10% FVC decline for first ime. CT chest at this tme (aug 2019) showed new rLL infiltrate. ECHO July 2019 without evicence of  elevated PASP and saw cards [facility] June 2019 and was considered to have worsenin dyspnea due to [facility] issues (reports stress test at [facility] that was normal but I cannot see it)  She reports after discharge she got better but in last several weeks has deteriorated with cough and dyspnea. There is new hypoxemia (currently RA with nail polish and poor circulation - 89% pulse ox) needing 2L [place]. Per [facility] improved with steroids and abx. CTA 03/24/2018 => shows that RLL inifltrate has improved . Other chronic ILD changes + and small  Hiatal hernia + witthout change.  Review of lab work does show eosinophilia at time of admision  EVENTS 03/21/18 - IgE -5, blood allegy panel - negative, 03/23/2018 - - admit . HIGH EOS 2300, ESR 48, BNP 89 , HIV neg 9/12- PCT negative, RVP negative 9/14 -  PCT < 0.1, Urine strep - negative, MRSA PCR - positive. IgE - normal 4, Blood allergy panel repeat - negative 03/26/2018 - leading consideration for airway (BO in RA +/- asthma) related flare either due to MRSA bronchitis or clinical suspicion of arytenoid inflmmation +/- GERD relatd flare (she has small hiatal hernia)  +/- ? Dysphagia  up causing mild hypoxemia acute resp failure, wheeze . Allergy and IgE blood work negative thought. Patient reports being better but says she is choking on drinkin water [facility] MD says wheeze improved significantly with steroids.  RN says was down to RA yesterday evening but needed 1L [place] at sleep. Today -Room air at rest 94% and desaturated to 86% walking 60 feet 03/27/2018  - better. Off o2 at rest. STill coughs with water and when lies down.  Husband at bedside. Both requesting ILD clinic followup . Desaturated t 79% walking 90 feet.   OV 04/12/2018  Subjective:  Patient ID: Tamala Fothergill, female , DOB: 1941-02-17 , age 87 y.o. , MRN: [id number] , ADDRESS: [place] [address]   04/12/2018 -   Chief  Complaint  Patient presents with  . Consult    Pt is a former  [facility] pt.  Pt denies any current complaints of cough, SOB, or CP but states the cough she originally had ended her up in the hosp 9/11-9/17 with dx acute respiratory failure. Pt does wear 2pulse with exertion and also wears 2L continuous when at home.     HPI JAHNAI SLINGERLAND 78 y.o. -presents for follow-up to the ILD clinic.  She is known to have rheumatoid arthritis with ILD changes.  She had been followed by Dr. Christinia Gully.  However in July 2019 in September 2019 she has had 2 admissions to the hospital with respiratory distress and hypoxemic respiratory failure.  In the first 1 that seem to be right lower lobe infiltrate and then she improved from it but in the second 1 even though the right lower lobe infiltrates were better she still was hypoxemic.  Acid reflux and dysphagia was considered a possible etiology but she passed swallow study 2 times.  They thought she had some reflux.  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  At the same time it is not clear if the ILD is progressive based on pulmonary function testing below   At this point in time she tells me that she is getting home physical therapy.  Her fatigue is improving but it is not fully resolved.  She was discharged on continuous oxygen which she is using.  However she is feeling less short of breath.  Today in fact when we turned her oxygen off and walked her she did not desaturate and this is a significant improvement.  She is on monthly Orencia through the [facility] rheumatologist Dr. Eda Paschal.  At this point in time she is put the Orencia on hold.  She told me that she is been on Orencia for 4 years and never had a respiratory exacerbation still recently x 2.  Although before going on Orencia she had pneumonia while on Remicade and the Remicade.  In terms of her rheumatoid arthritis she hardly has any pain.  Her joint architecture is fairly well-preserved because of various  immunomodulators over time.  She says that she was on Remicade for years and when she stopped it for 8 weeks before the switch to [place] she never really had a relapse in her rheumatoid arthritis.  She is largely pain and stiffness free.  She believes she can go without  her Orencia for a while.  Review of the literature shows greater than 10% chance of a respiratory infection especially COPD exacerbation.  Although the time frame for this is unclear.       OV 06/07/2018  Subjective:  Patient ID: Tamala Fothergill, female , DOB: 1941/06/06 , age 5 y.o. , MRN: [id number] , ADDRESS: [place] [address]   06/07/2018 -   Chief Complaint  Patient presents with  . Follow-up    ILD, PFT done today, some wheezing and coughing but better tha before   Rheumatoid arthritis ILD and asthma/obstructive lung disease phenotype on Dulera  HPI ELLENA KAMEN 78 y.o. -presents for routine follow-up with her husband.  She is here to follow-up with [facility] Dr. Stann Mainland.  She plans to do this in December 2019.  She continues to be off Orencia.  Her joints are slowly getting stiff again.  She believes that she will need to be back on immunosuppression agent again.  She currently  continues Medrol 4 mg alternating with 2 mg.  This for her rheumatoid arthritis.  In terms of her joints she continues on Medrol 4 mg alternating with 2 mg but not on any other immunosuppression agent.  Overall she is been stable but for the last 2 weeks has had green sputum and wheezing and chest congestion and cough.  She recently visited her husband who was hospitalized and walking the long hallways at [facility] made a short of breath but she thinks this is probably baseline for her.  There are no other new issues.  She did have spirometry and DLCO and this shows a decline compared to September 2019 and a similar to July 2019.  It is documented below.  This is probably reflective of a  flareup   OV 07/21/2018  Subjective:  Patient ID: Tamala Fothergill, female , DOB: 1941/04/14 , age 34 y.o. , MRN: [id number] , ADDRESS: [place] [id number]   07/21/2018 -   Chief Complaint  Patient presents with  . Follow-up    Pt states she has been doing well since last visit. States she is about to begin Rituxan with [facility]. Pt still becomes SOB with exertion. Denies any complaints of cough or CP.   Rheumatoid arthritis ILD and asthma/obstructive lung disease phenotype on Dulera  HPI LAVETTE YANKOVICH 78 y.o. -presents for follow-up of the above.  Last seen just before Thanksgiving 2019.  In the interim overall stable although on June 23, 2018 she climbs a steep flight of stairs which is unusual exertion for her and she became very dyspneic.  Following day saw Dr. Stann Mainland at [facility] rheumatology and was given Z-Pak and prednisone and started feeling better.  Although it is not fully clear to me she had fever and bronchitic symptoms.  I reviewed Dr. Stann Mainland note.  Dr. Stann Mainland is decided to start Rituxan for rheumatoid arthritis.  She is only having some minimal joint pain at this point.  She is off Orencia and continues to be off [place].  She did have some blood work with [place] before starting Rituxan.  She is due to see Dr. Stann Mainland within the next week and start her Rituxan.  Her liver function test July 18, 2017 is normal hemoglobin is normal.  CRP is also normal.  We did spirometry and walking desaturation test.  These show improvement compared to before and these are documented below.  Currently she not using nighttime or daytime oxygen.  She is wondering if she could switch rheumatology care to [facility].  This is because while she likes [employer] she is getting older and more frail and feels some body local would be of help.  I have sent a message to Dr. Estanislado Pandy inquiring.  Certainly we can help her with Rituxan infusions at [facility]  if needed.  She will check on this with her [facility] rheumatologist.      Results for SADIYA, DURAND (MRN [id number]) as of 06/07/2018 14:25  Ref. Range 07/17/2013 09:53 02/07/2016 13:09 01/11/2017 11:05 01/10/2018 10:13 03/25/2018 14:58 06/07/2018 10:57 07/21/2018   FVC-Pre Latest Units: L 2.13 2.16 2.14 1.93 1.99 1.81 2.18  FVC-%Pred-Pre Latest Units: % 68 71 71 65 75 68 77%  Results for GENEVEIVE, FURNESS (MRN [id number]) as of 06/07/2018 14:25  Ref. Range 07/17/2013 09:53 02/07/2016 13:09 01/11/2017 11:05 01/10/2018 10:13 03/25/2018 14:58 06/07/2018 10:57 07/21/2018   DLCO unc Latest Units: ml/min/mmHg 18.85 18.25 16.50 16.09 16.58 14.06  x  DLCO unc % pred Latest Units: % 69 67 61 59 70 59 x     Simple office walk  feet x  3 laps goal with forehead probe 04/12/2018  07/21/2018   O2 used Room air - off o2 x 10 min Room air   Number laps completed 3 x 185 feet 3 x 250 feet  Comments about pace normal normal  Resting Pulse Ox/HR 99% and 72/min 97% and 74/min  Final Pulse Ox/HR 98% and 95/min 96% and 96/min  Desaturated </= 88% no no  Desaturated <= 3% points no no  Got Tachycardic >/= 90/min yes yes  Symptoms at end of test No complaint Mild dyspnea  Miscellaneous comments improed from hospital Same v improved     ROS - per HPI     has a past medical history of Abnormal finding of blood chemistry, Asthma, H/O measles, H/O varicella, Hypertension, 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[place]), 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   reports that she has never smoked. She has never used smokeless tobacco.  Past Surgical History:  Procedure Laterality Date  . CHOLECYSTECTOMY  2011  . WISDOM TOOTH EXTRACTION      Allergies  Allergen Reactions  . Penicillins Other (See Comments)    Reaction unknown occurred during childhood Has patient  had a PCN reaction causing immediate rash, facial/tongue/throat swelling, SOB or lightheadedness with hypotension: Unknown Has patient had a PCN reaction causing severe rash involving mucus membranes or skin necrosis: Unknown Has patient had a PCN reaction that required hospitalization: No Has patient had a PCN reaction occurring within the last 10 years: No If all of the above answers are "NO", then may proceed with Cephalosporin use.  Unsur  . Remicade [Infliximab] Other (See Comments)    Reaction unknown  . Sulfa Antibiotics Other (See Comments)    Reaction unknown  . Sulfamethoxazole Other (See Comments)    Unsure of reaction As a child not sure     Immunization History  Administered Date(s) Administered  . Influenza Split 04/12/2013, 04/12/2014, 03/14/2015, 04/23/2016  . Influenza, High Dose Seasonal PF 04/12/2017, 03/24/2018  . Influenza-Unspecified 04/03/2013, 04/22/2017  . Pneumococcal Conjugate-13 04/03/2013, 09/28/2014  . Pneumococcal Polysaccharide-23 07/13/2012, 07/24/2013  . Tdap 12/10/2017    Family History  Problem Relation Age of Onset  . Asthma Mother   . Anemia Mother   . Polymyalgia rheumatica Mother   . COPD Father   . Pulmonary fibrosis Father   . Breast cancer Maternal Grandmother 73     Current Outpatient Medications:  .  albuterol (PROAIR HFA) 108 (90 Base) MCG/ACT inhaler, Inhale 2 puffs into the lungs every 4 (four) hours as needed for wheezing or shortness of breath., Disp: 1 Inhaler, Rfl: 3 .  albuterol (PROVENTIL) (2.5 MG/3ML) 0.083% nebulizer solution, Inhale 3 mLs (2.5 mg total) into the lungs every 4 (four) hours as needed for wheezing or shortness of breath., Disp: 75 vial, Rfl: 3 .  amitriptyline (ELAVIL) 25 MG tablet, Take 25 mg by mouth at bedtime., Disp: , Rfl: 0 .  beta carotene w/minerals (OCUVITE) tablet, Take 1 tablet by mouth daily., Disp: , Rfl:  .  cholecalciferol (VITAMIN D) 1000 UNITS tablet, Take 1,000 Units by mouth daily.,  Disp: , Rfl:  .  dextromethorphan (DELSYM) 30 MG/5ML liquid, Take 2.5 mLs (15 mg total) by mouth at bedtime. (Patient taking differently: Take 15 mg by  mouth as needed. ), Disp: 89 mL, Rfl: 0 .  gabapentin (NEURONTIN) 300 MG capsule, Take 600 mg by mouth at bedtime., Disp: , Rfl:  .  levalbuterol (XOPENEX) 0.63 MG/3ML nebulizer solution, Inhale 0.63 mg into the lungs 2 (two) times daily as needed for wheezing or shortness of breath. , Disp: , Rfl:  .  losartan (COZAAR) 25 MG tablet, Take 25 mg by mouth daily., Disp: , Rfl: 11 .  methylPREDNISolone (MEDROL) 8 MG tablet, Take 4 tablets daily x 2 days, then 2 tablets daily x 2 days, then 1 tablet daily x 2 days (Patient taking differently: Take 4 mg by mouth daily. 70m alternating with 269mdaily), Disp: 14 tablet, Rfl: 0 .  mometasone-formoterol (DULERA) 100-5 MCG/ACT AERO, Inhale 2 puffs into the lungs 2 (two) times daily., Disp: 1 Inhaler, Rfl: 11 .  pantoprazole (PROTONIX) 40 MG tablet, Take 1 tablet (40 mg total) by mouth 2 (two) times daily before a meal., Disp: 180 tablet, Rfl: 3 .  polyethylene glycol (MIRALAX / GLYCOLAX) packet, Take 17 g by mouth daily. (Patient taking differently: Take 17 g by mouth daily as needed (For constipation.). ), Disp: 14 each, Rfl: 0 .  venlafaxine XR (EFFEXOR-XR) 75 MG 24 hr capsule, Take 75 mg by mouth at bedtime. , Disp: , Rfl:       Objective:   Vitals:   07/21/18 1120  BP: 138/78  Pulse: 74  SpO2: 97%  Weight: 140 lb (63.5 kg)  Height: 5' 5" (1.651 m)    Estimated body mass index is 23.3 kg/m as calculated from the following:   Height as of this encounter: 5' 5" (1.651 m).   Weight as of this encounter: 140 lb (63.5 kg).  _0 @  [employer] 07/21/18 1120  Weight: 140 lb (63.5 kg)     Physical Exam  General Appearance:    Alert, cooperative, no distress, appears stated age - plesant , Deconditioned looking - no , OBESE  - no, Sitting on Wheelchair -  no  Head:    Normocephalic,  without obvious abnormality, atraumatic  Eyes:    PERRL, conjunctiva/corneas clear,  Ears:    Normal TM's and external ear canals, both ears  Nose:   Nares normal, septum midline, mucosa normal, no drainage    or sinus tenderness. OXYGEN ON  - no . Patient is @ ra   Throat:   Lips, mucosa, and tongue normal; teeth and gums normal. Cyanosis on lips - no  Neck:   Supple, symmetrical, trachea midline, no adenopathy;    thyroid:  no enlargement/tenderness/nodules; no carotid   bruit or JVD  Back:     Symmetric, no curvature, ROM normal, no CVA tenderness  Lungs:     Distress - no , Wheeze no, Barrell Chest - no, Purse lip breathing - no, Crackles - mld crackles at base   Chest Wall:    No tenderness or deformity.    Heart:    Regular rate and rhythm, S1 and S2 normal, no rub   or gallop, Murmur - no  Breast Exam:    NOT DONE  Abdomen:     Soft, non-tender, bowel sounds active all four quadrants,    no masses, no organomegaly. Visceral obesity - no  Genitalia:   NOT DONE  Rectal:   NOT DONE  Extremities:   Extremities - normal, Has Cane - non, Clubbing - no, Edema - no  Pulses:   2+ and symmetric all extremities  Skin:   Stigmata of Connective Tissue Disease - RA +  Lymph nodes:   Cervical, supraclavicular, and axillary nodes normal  Psychiatric:  Neurologic:   Pleasant - yes, Anxious - no, Flat affect - no  CAm-ICU - neg, Alert and Oriented x 3 - yes, Moves all 4s - yes, Speech - normal, Cognition - intact           Assessment:       ICD-10-CM   1. 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 Asthma, unspecified asthma severity, unspecified whether complicated, unspecified whether persistent J45.909   3. DOE (dyspnea on exertion) R06.09        Plan:     Patient Instructions     ICD-10-CM   1. 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 Asthma, unspecified asthma severity, unspecified  whether complicated, unspecified whether persistent J45.909   3. DOE (dyspnea on exertion) R06.09    In my view you have a combination of asthma and interstitial lung disease This contributes to shortness of breath on exertion and also episodic bronchitis episodes At this point in time your lung function is improved  Plan -Based on current literature I do not see a role for anti-fibrotic therapy on your lungs -If the lung disease progresses then we could consider anti-fibrotic therapy such as nintedanib -Support plan for Rituxan with Dr. Stann Mainland at [facility] for your rheumatoid arthritis -Continue to monitor your lung function -any decline then we can consider nintedanib -I will inquire about local rheumatologist support for you  Follow-up -Spirometry and DLCO in 6 months -Return to ILD clinic in 6 months -Return sooner if any respiratory issues   > 50% of this > 25 min visit spent in face to face counseling or coordination of care - by this undersigned MD - Dr Brand Males. This includes one or more of the following documented above: discussion of test results, diagnostic or treatment recommendations, prognosis, risks and benefits of management options, instructions, education, compliance or risk-factor reduction   SIGNATURE    Dr. Brand Males, M.D., F.C.C.P,  Pulmonary and Critical Care Medicine Staff Physician, [place] Director - Interstitial Lung Disease  Program  [facility] [place], [place], [address]  Pager: [phone], If no answer or between  15:00h - 7:00h: call [phone] Telephone: [phone]  12:14 PM 07/21/2018

## 2018-07-27 DIAGNOSIS — M859 Disorder of bone density and structure, unspecified: Secondary | ICD-10-CM | POA: Diagnosis not present

## 2018-07-27 DIAGNOSIS — M8589 Other specified disorders of bone density and structure, multiple sites: Secondary | ICD-10-CM | POA: Diagnosis not present

## 2018-07-29 DIAGNOSIS — M0579 Rheumatoid arthritis with rheumatoid factor of multiple sites without organ or systems involvement: Secondary | ICD-10-CM | POA: Diagnosis not present

## 2018-08-08 DIAGNOSIS — I872 Venous insufficiency (chronic) (peripheral): Secondary | ICD-10-CM | POA: Diagnosis not present

## 2018-08-08 DIAGNOSIS — I87319 Chronic venous hypertension (idiopathic) with ulcer of unspecified lower extremity: Secondary | ICD-10-CM | POA: Diagnosis not present

## 2018-08-08 DIAGNOSIS — Z6822 Body mass index (BMI) 22.0-22.9, adult: Secondary | ICD-10-CM | POA: Diagnosis not present

## 2018-08-12 DIAGNOSIS — M0579 Rheumatoid arthritis with rheumatoid factor of multiple sites without organ or systems involvement: Secondary | ICD-10-CM | POA: Diagnosis not present

## 2018-08-30 ENCOUNTER — Encounter: Payer: Self-pay | Admitting: Pulmonary Disease

## 2018-08-30 ENCOUNTER — Ambulatory Visit (INDEPENDENT_AMBULATORY_CARE_PROVIDER_SITE_OTHER): Payer: Medicare Other | Admitting: Pulmonary Disease

## 2018-08-30 VITALS — BP 118/72 | HR 75 | Temp 98.3°F | Ht 65.5 in | Wt 143.0 lb

## 2018-08-30 DIAGNOSIS — J8489 Other specified interstitial pulmonary diseases: Secondary | ICD-10-CM | POA: Diagnosis not present

## 2018-08-30 DIAGNOSIS — J45909 Unspecified asthma, uncomplicated: Secondary | ICD-10-CM | POA: Insufficient documentation

## 2018-08-30 DIAGNOSIS — J209 Acute bronchitis, unspecified: Secondary | ICD-10-CM | POA: Diagnosis not present

## 2018-08-30 DIAGNOSIS — M358 Other specified systemic involvement of connective tissue: Secondary | ICD-10-CM

## 2018-08-30 MED ORDER — DOXYCYCLINE HYCLATE 100 MG PO TABS: 100.0000 mg | ORAL_TABLET | Freq: Two times a day (BID) | ORAL | 0 refills | Status: DC

## 2018-08-30 MED ORDER — PREDNISONE 10 MG PO TABS: ORAL_TABLET | ORAL | 0 refills | Status: DC

## 2018-08-30 MED ORDER — LEVALBUTEROL TARTRATE 45 MCG/ACT IN AERO: 2.0000 | INHALATION_SPRAY | RESPIRATORY_TRACT | 2 refills | Status: DC | PRN

## 2018-08-30 MED ORDER — MOMETASONE FURO-FORMOTEROL FUM 200-5 MCG/ACT IN AERO: 2.0000 | INHALATION_SPRAY | Freq: Two times a day (BID) | RESPIRATORY_TRACT | 0 refills | Status: DC

## 2018-08-30 NOTE — Assessment & Plan Note (Signed)
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 Chase Caller Continue planned Rituxan infusion in 6 months

## 2018-08-30 NOTE — Assessment & Plan Note (Addendum)
Assessment:  Expiratory wheezes on exam today Increase productive cough over the last 3 days Purulent sputum green to yellow mucus Increased wheezing over the last 3 days Maintained on Dulera 100  Plan: Start trial of Dulera 200 Prednisone taper today Doxycycline today Xopenex rescue inhaler order placed today, as patient reported that albuterol rescue inhaler was given her heart palpitations Follow-up in 2 to 4 weeks to ensure resolve symptoms

## 2018-08-30 NOTE — Assessment & Plan Note (Signed)
Assessment: Increased purulent cough over the last 3 days Purulent sputum cream to yellow mucus Known interstitial lung disease managed on Rituxan therapy  Plan: Doxycycline today Prednisone taper today Follow-up with our office in 2 to 4 weeks to ensure resolved symptoms

## 2018-08-30 NOTE — Patient Instructions (Addendum)
Doxycycline >>> 1 100 mg tablet every 12 hours for 7 days >>>take with food  >>>wear sunscreen   Prednisone 10mg  tablet  >>>4 tabs for 2 days, then 3 tabs for 2 days, 2 tabs for 2 days, then 1 tab for 2 days, then resume normal dosing  >>>take with food  >>>take in the morning   Trial of Dulera 200 >>> 2 puffs in the morning right when you wake up, rinse out your mouth after use, 12 hours later 2 puffs, rinse after use >>> Take this daily, no matter what >>> This is not a rescue inhaler  >>>this replaces Dulera 100   We will send in prescription for Xopenex HFA Can use every 4-6 hours as needed for shortness of breath and wheezing  Keep regular follow-up with rheumatology Continue forward with Rituxan infusion in 6 months  Follow up in 2-4 weeks with Wyn Quaker FNP   It is flu season:   >>>Remember to be washing your hands regularly, using hand sanitizer, be careful to use around herself with has contact with people who are sick will increase her chances of getting sick yourself. >>> Best ways to protect herself from the flu: Receive the yearly flu vaccine, practice good hand hygiene washing with soap and also using hand sanitizer when available, eat a nutritious meals, get adequate rest, hydrate appropriately   Please contact the office if your symptoms worsen or you have concerns that you are not improving.   Thank you for choosing [facility] for your healthcare, and for allowing [place] to partner with you on your healthcare journey. I am thankful to be able to provide care to you today.   Wyn Quaker FNP-C

## 2018-08-30 NOTE — Progress Notes (Signed)
@Patient  ID: Stacey [name], female    DOB: 1940-09-15, 78 y.o.   MRN: [id number]  Chief Complaint  Patient presents with  . Acute Visit    seen for cough and wheezing. productive cough with green mucus.     Referring provider: Shon Baton, MD  HPI:  78 year old female followed in our office for Interstitial lung disease likely related to rheumatoid arthritis, asthma  PMH: Rheumatoid arthritis (managed by [facility] rheumatology) Smoker/ Smoking History: Never Smoker  Maintenance: Rituxan therapy Pt of: Dr. Chase Caller  08/30/2018  - Visit   78 year old female never smoker followed in our office for asthma as well as interstitial lung disease likely related to rheumatoid arthritis.  Patient reports that she is had increased productive cough over the last 3 days as well as increased wheezing for the last 3 days.  Patient has been maintained on Dulera 100.  Patient also has had 2 Rituxan infusions and currently is scheduled to receive another one in 6 months.  This is been managed by [facility] rheumatology.  Per last office note patient is interested in switching to a [facility] rheumatologist.  Patient did just recently finished a prednisone taper last week which was from rheumatology.  Patient reports no known sick contacts.  Patient was recently at a basketball game for her grandson which potentially could be known exposure for sick contacts.   Tests:   FENO:  Lab Results  Component Value Date   NITRICOXIDE 30 02/25/2018    PFT: PFT Results Latest Ref Rng & Units 07/21/2018 06/07/2018 03/25/2018 01/10/2018 01/11/2017 02/07/2016 07/17/2013  FVC-Pre L 2.18 1.81 1.99 1.93 2.14 2.16 2.13  FVC-Predicted Pre % 77 68 75 65 71 71 68  FVC-Post L - - - 1.99 2.17 2.24 2.24  FVC-Predicted Post % - - - 67 72 74 71  Pre FEV1/FVC % % 79 69 75 77 77 76 81  Post FEV1/FCV % % - - - 78 78 77 82  FEV1-Pre L 1.72 1.25 1.50 1.48 1.66 1.65 1.73  FEV1-Predicted Pre % 81 63 76 67 74 72 73  FEV1-Post L - - - 1.55 1.69  1.73 1.85  DLCO UNC% % - 59 70 59 61 67 69  DLCO COR %Predicted % - 80 97 81 74 89 85  TLC L - - - 4.40 5.11 5.21 4.85  TLC % Predicted % - - - 82 95 97 90  RV % Predicted % - - - 102 131 133 107    Imaging: No results found.    Specialty Problems      Pulmonary Problems   Asthma   Cough variant asthma vs uacs     Followed in Pulmonary clinic/ [facility]/ [facility] - 04/18/15 rec max rx for gerd > resolve - recurred Feb 2018 > max gerd rx added 09/25/2016  - 09/25/2016  After extensive coaching HFA effectiveness =    75% so try symbicort 80 2bid x one sample only  - improved on higher doses of pred and saba by report 10/22/2016 so rec medrol 4 mg double dose until better then resume previous dose  - flutter valve teaching done 10/22/2016  And repeated 01/17/2018 as well as neb training  - refractory flare onset 01/11/18 rec medrol up to 32 qd and cyclical cough rx with tyl#3  - DgEs  01/25/18    Spontaneous reflux to level of the midthoracic esophagus when patient is placed in the supine position - Asthma exac. 02/25/18  FENO 30, medrol  taper (12mg  x 3 days, 8mg  x 3 days then cont 4mg  qd) - Allergy profile 03/21/18  >  Eos 1.1  /  IgE  5 rast neg      Multiple pulmonary nodules/RA lung dz     Followed in Pulmonary clinic/ [facility]/ [facility]  - See CT 05/16/13  1. The suspected pulmonary nodule in the right lung apex is actually  a calcified posterior pleural plaque and is benign in appearance.  2. Nodularity in the lateral aspect of the right upper lobe is felt  to be inflammatory in origin. However, there is a 9 mm nodule in  this area.  - PFT's 07/17/13 VC 74% with no obst and dlco 69 correctrs to 85% - CT 10/16/13 1. Stable chronic lung disease with scattered subpleural scarring and asymmetric right upper lobe subpleural nodularity, likely postinflammatory (prior granulomatous disease). 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 2. No suspicious pulmonary nodule or  acute process demonstrated. - CT 10/21/2015 Mild growth of irregular 1.0 cm subpleural left upper lobe pulmonary nodule since 10/16/2013, cannot exclude an indolent primary bronchogenic carcinoma.  -  PET 11/01/2015 > neg with ild c/w RA - PFT's  02/07/2016  VC  1.99 (65%)   study with DLCO  67/68 % corrects to 89 % for alv volume   - HRCT rec p 10 days of levaquin 10/23/2016  - HRCT 11/06/2016  1. The appearance of the chest remains compatible with interstitial lung disease. 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 No significant progression of findings compared to the prior study. 2. Chronic areas of pleural-parenchymal thickening/calcification again noted throughout the apical portions of the lungs bilaterally, most compatible with areas of chronic post infectious or inflammatory scarring. - PFT's  01/11/2017   VC 1.91 (64%) s obst or curvature   p nothing prior to study with DLCO  61/59c % corrects to 74  % for alv volume   - PFT's  01/10/2018  VC  1.88 (63%) s significant obst p nothing prior to study with DLCO  59 % corrects to 81  % for alv volume          Restrictive lung disease    Followed in Pulmonary clinic/ [facility]/ [facility] - PFT's 07/17/13 VC 74% with no obst and dlco 69 correctrs to 85% - PFT's 01/11/2017  VC  64% and dlco 61/69 c corrects to 74  - PFTs 01/10/18 - FVC 1.99 (67%), FEV1 1.55 (70%), Ratio 78, DLCOunc 16.09 (59%)      Dyspnea   Interstitial lung disease due to connective tissue disease (HCC)   Chronic lung disease   Hypoxia   DOE (dyspnea on exertion)   Asthma exacerbation   Acute respiratory failure with hypoxia (HCC)   Chronic respiratory failure with hypoxia (HCC)   Acute bronchitis      Allergies  Allergen Reactions  . Penicillins Other (See Comments)    Reaction unknown occurred during childhood Has patient had a PCN reaction causing immediate rash, facial/tongue/throat  swelling, SOB or lightheadedness with hypotension: Unknown Has patient had a PCN reaction causing severe rash involving mucus membranes or skin necrosis: Unknown Has patient had a PCN reaction that required hospitalization: No Has patient had a PCN reaction occurring within the last 10 years: No If all of the above answers are "NO", then may proceed with Cephalosporin use.  Unsur  . Remicade [Infliximab] Other (See Comments)    Reaction  unknown  . Sulfa Antibiotics Other (See Comments)    Reaction unknown  . Sulfamethoxazole Other (See Comments)    Unsure of reaction As a child not sure     Immunization History  Administered Date(s) Administered  . Influenza Split 04/12/2013, 04/12/2014, 03/14/2015, 04/23/2016  . Influenza, High Dose Seasonal PF 04/12/2017, 03/24/2018  . Influenza-Unspecified 04/03/2013, 04/22/2017  . Pneumococcal Conjugate-13 04/03/2013, 09/28/2014  . Pneumococcal Polysaccharide-23 07/13/2012, 07/24/2013  . Tdap 12/10/2017    Past Medical History:  Diagnosis Date  . Abnormal finding of blood chemistry   . Asthma   . H/O measles   . H/O varicella   . Hypertension   . 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[place])   . 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 Lichen sclerosus 67/59/16   Asymptomatic  . Low iron   . Mitral valve prolapse   . Osteoarthritis   . Osteoporosis   . Pneumonia   . Post herpetic neuralgia   . Rheumatoid arthritis(714.0)   . Yeast infection     Tobacco History: Social History   Tobacco Use  Smoking Status Never Smoker  Smokeless Tobacco Never Used   Counseling given: Yes  Continue to not smoke  Outpatient Encounter Medications as of 08/30/2018  Medication Sig  . albuterol (PROAIR HFA) 108 (90 Base) MCG/ACT inhaler Inhale 2 puffs into the lungs every 4 (four) hours as needed for wheezing or shortness of breath.  Marland Kitchen albuterol (PROVENTIL) (2.5 MG/3ML) 0.083% nebulizer solution Inhale 3 mLs (2.5 mg total) into the lungs every 4 (four) hours as  needed for wheezing or shortness of breath.  Marland Kitchen amitriptyline (ELAVIL) 25 MG tablet Take 25 mg by mouth at bedtime.  . beta carotene w/minerals (OCUVITE) tablet Take 1 tablet by mouth daily.  . cholecalciferol (VITAMIN D) 1000 UNITS tablet Take 1,000 Units by mouth daily.  Marland Kitchen dextromethorphan (DELSYM) 30 MG/5ML liquid Take 2.5 mLs (15 mg total) by mouth at bedtime. (Patient taking differently: Take 15 mg by mouth as needed. )  . gabapentin (NEURONTIN) 300 MG capsule Take 600 mg by mouth at bedtime.  . levalbuterol (XOPENEX) 0.63 MG/3ML nebulizer solution Inhale 0.63 mg into the lungs 2 (two) times daily as needed for wheezing or shortness of breath.   . losartan (COZAAR) 25 MG tablet Take 25 mg by mouth daily.  . mometasone-formoterol (DULERA) 100-5 MCG/ACT AERO Inhale 2 puffs into the lungs 2 (two) times daily.  . pantoprazole (PROTONIX) 40 MG tablet Take 1 tablet (40 mg total) by mouth 2 (two) times daily before a meal.  . polyethylene glycol (MIRALAX / GLYCOLAX) packet Take 17 g by mouth daily. (Patient taking differently: Take 17 g by mouth daily as needed (For constipation.). )  . venlafaxine XR (EFFEXOR-XR) 75 MG 24 hr capsule Take 75 mg by mouth at bedtime.   Marland Kitchen doxycycline (VIBRA-TABS) 100 MG tablet Take 1 tablet (100 mg total) by mouth 2 (two) times daily.  Marland Kitchen levalbuterol (XOPENEX HFA) 45 MCG/ACT inhaler Inhale 2 puffs into the lungs every 4 (four) hours as needed for wheezing.  . methylPREDNISolone (MEDROL) 8 MG tablet Take 4 tablets daily x 2 days, then 2 tablets daily x 2 days, then 1 tablet daily x 2 days (Patient not taking: Reported on 08/30/2018)  . mometasone-formoterol (DULERA) 200-5 MCG/ACT AERO Inhale 2 puffs into the lungs 2 (two) times daily.  . predniSONE (DELTASONE) 10 MG tablet 4 tabs for 2 days, then 3 tabs for 2 days, 2 tabs for 2 days, then 1 tab for  2 days, then stop   No facility-administered encounter medications on file as of 08/30/2018.      Review of  Systems  Review of Systems  Constitutional: Positive for fatigue. Negative for chills, fever and unexpected weight change.  HENT: Positive for congestion (Clear nasal drainage). Negative for sinus pressure and sinus pain.   Respiratory: Positive for cough (Productive cough with green to yellow mucus), shortness of breath and wheezing. Negative for chest tightness.   Cardiovascular: Negative for chest pain and palpitations.  Gastrointestinal: Negative for diarrhea, nausea and vomiting.  Musculoskeletal: Negative for arthralgias.  Skin: Negative for color change.  Allergic/Immunologic: Negative for environmental allergies and food allergies.  Neurological: Negative for dizziness, light-headedness and headaches.  Psychiatric/Behavioral: Negative for dysphoric mood. The patient is not nervous/anxious.   All other systems reviewed and are negative.    Physical Exam  BP 118/72 (BP Location: Left Arm, Cuff Size: Normal)   Pulse 75   Temp 98.3 F (36.8 C) (Oral)   Ht 5' 5.5" (1.664 m)   Wt 143 lb (64.9 kg)   SpO2 99%   BMI 23.43 kg/m   Wt Readings from Last 5 Encounters:  08/30/18 143 lb (64.9 kg)  07/21/18 140 lb (63.5 kg)  07/18/18 140 lb (63.5 kg)  06/16/18 141 lb (64 kg)  06/07/18 141 lb (64 kg)    Physical Exam  Constitutional: She is oriented to person, place, and time and well-developed, well-nourished, and in no distress. No distress.  HENT:  Head: Normocephalic and atraumatic.  Right Ear: Hearing, tympanic membrane, external ear and ear canal normal.  Left Ear: Hearing, tympanic membrane, external ear and ear canal normal.  Nose: Mucosal edema and rhinorrhea present. Right sinus exhibits no maxillary sinus tenderness and no frontal sinus tenderness. Left sinus exhibits no maxillary sinus tenderness and no frontal sinus tenderness.  Mouth/Throat: Uvula is midline and oropharynx is clear and moist. No oropharyngeal exudate.  +post nasal drip   Eyes: Pupils are equal,  round, and reactive to light.  Neck: Normal range of motion. Neck supple.  Cardiovascular: Normal rate, regular rhythm and normal heart sounds.  Pulmonary/Chest: Effort normal. No accessory muscle usage. No respiratory distress. She has no decreased breath sounds. She has wheezes (exp wheezes ). She has no rhonchi.  Musculoskeletal: Normal range of motion.        General: No edema.  Lymphadenopathy:    She has no cervical adenopathy.  Neurological: She is alert and oriented to person, place, and time. Gait normal.  Skin: Skin is warm and dry. She is not diaphoretic.  Psychiatric: Mood, memory, affect and judgment normal.  Nursing note and vitals reviewed.     Lab Results:  CBC    Component Value Date/Time   WBC 9.5 07/18/2018 1401   RBC 4.49 07/18/2018 1401   HGB 13.0 07/18/2018 1401   HGB LA11 03/26/2018 0507   HCT 39.6 07/18/2018 1401   PLT 260.0 07/18/2018 1401   MCV 88.3 07/18/2018 1401   MCH 30.0 03/27/2018 0509   MCHC 32.9 07/18/2018 1401   RDW 15.0 07/18/2018 1401   LYMPHSABS 2.8 07/18/2018 1401   MONOABS 0.6 07/18/2018 1401   EOSABS 0.3 07/18/2018 1401   BASOSABS 0.1 07/18/2018 1401    BMET    Component Value Date/Time   NA 142 03/27/2018 0509   K 4.5 03/27/2018 0509   CL 102 03/27/2018 0509   CO2 30 03/27/2018 0509   GLUCOSE 130 (H) 03/27/2018 0509   BUN 21  03/27/2018 0509   CREATININE 0.57 03/29/2018 0525   CALCIUM 9.0 03/27/2018 0509   GFRNONAA >60 03/29/2018 0525   GFRAA >60 03/29/2018 0525    BNP    Component Value Date/Time   BNP 89.4 03/23/2018 0732    ProBNP    Component Value Date/Time   PROBNP 38.0 03/21/2018 1119      Assessment & Plan:    Asthma Assessment:  Expiratory wheezes on exam today Increase productive cough over the last 3 days Purulent sputum green to yellow mucus Increased wheezing over the last 3 days Maintained on Dulera 100  Plan: Start trial of Dulera 200 Prednisone taper today Doxycycline today Xopenex  rescue inhaler order placed today, as patient reported that albuterol rescue inhaler was given her heart palpitations Follow-up in 2 to 4 weeks to ensure resolve symptoms  Interstitial lung disease due to connective tissue disease ([place]) Assessment: 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 Chase Caller Continue planned Rituxan infusion in 6 months  Acute bronchitis Assessment: Increased purulent cough over the last 3 days Purulent sputum cream to yellow mucus Known interstitial lung disease managed on Rituxan therapy  Plan: Doxycycline today Prednisone taper today Follow-up with our office in 2 to 4 weeks to ensure resolved symptoms     Lauraine Rinne, NP 08/30/2018   This appointment was 34 min long with over 50% of the time in direct face-to-face patient care, assessment, plan of care, and follow-up.

## 2018-08-31 DIAGNOSIS — L821 Other seborrheic keratosis: Secondary | ICD-10-CM | POA: Diagnosis not present

## 2018-08-31 DIAGNOSIS — Z85828 Personal history of other malignant neoplasm of skin: Secondary | ICD-10-CM | POA: Diagnosis not present

## 2018-08-31 DIAGNOSIS — L918 Other hypertrophic disorders of the skin: Secondary | ICD-10-CM | POA: Diagnosis not present

## 2018-08-31 DIAGNOSIS — D2239 Melanocytic nevi of other parts of face: Secondary | ICD-10-CM | POA: Diagnosis not present

## 2018-08-31 DIAGNOSIS — D692 Other nonthrombocytopenic purpura: Secondary | ICD-10-CM | POA: Diagnosis not present

## 2018-08-31 DIAGNOSIS — D225 Melanocytic nevi of trunk: Secondary | ICD-10-CM | POA: Diagnosis not present

## 2018-08-31 DIAGNOSIS — L218 Other seborrheic dermatitis: Secondary | ICD-10-CM | POA: Diagnosis not present

## 2018-08-31 DIAGNOSIS — L817 Pigmented purpuric dermatosis: Secondary | ICD-10-CM | POA: Diagnosis not present

## 2018-08-31 DIAGNOSIS — L650 Telogen effluvium: Secondary | ICD-10-CM | POA: Diagnosis not present

## 2018-09-14 ENCOUNTER — Ambulatory Visit (INDEPENDENT_AMBULATORY_CARE_PROVIDER_SITE_OTHER): Payer: Medicare Other | Admitting: Pulmonary Disease

## 2018-09-14 ENCOUNTER — Encounter: Payer: Self-pay | Admitting: Pulmonary Disease

## 2018-09-14 VITALS — BP 132/64 | HR 72 | Ht 66.0 in | Wt 143.0 lb

## 2018-09-14 DIAGNOSIS — J209 Acute bronchitis, unspecified: Secondary | ICD-10-CM

## 2018-09-14 DIAGNOSIS — M359 Systemic involvement of connective tissue, unspecified: Secondary | ICD-10-CM

## 2018-09-14 DIAGNOSIS — J8489 Other specified interstitial pulmonary diseases: Secondary | ICD-10-CM

## 2018-09-14 DIAGNOSIS — M358 Other specified systemic involvement of connective tissue: Secondary | ICD-10-CM | POA: Diagnosis not present

## 2018-09-14 DIAGNOSIS — J4540 Moderate persistent asthma, uncomplicated: Secondary | ICD-10-CM | POA: Diagnosis not present

## 2018-09-14 NOTE — Patient Instructions (Addendum)
Resume Dulera 100 >>> 2 puffs in the morning right when you wake up, rinse out your mouth after use, 12 hours later 2 puffs, rinse after use >>> Take this daily, no matter what >>> This is not a rescue inhaler   Only use your albuterol as a rescue medication to be used if you can't catch your breath by resting or doing a relaxed purse lip breathing pattern.  - The less you use it, the better it will work when you need it. - Ok to use up to 2 puffs  every 4 hours if you must but call for immediate appointment if use goes up over your usual need - Don't leave home without it !!  (think of it like the spare tire for your car)   Continue follow-up with [facility] rheumatology  Continue Rituxan infusions  Schedule a follow-up spirometry with DLCO prior to your office visit with Dr. Chase Caller in July/2020 Schedule a follow-up visit with Dr. Chase Caller in July/2020 in ILD clinic >>>77min visit   It is flu season:   >>> Best ways to protect herself from the flu: Receive the yearly flu vaccine, practice good hand hygiene washing with soap and also using hand sanitizer when available, eat a nutritious meals, get adequate rest, hydrate appropriately   Please contact the office if your symptoms worsen or you have concerns that you are not improving.   Thank you for choosing [facility] for your healthcare, and for allowing [place] to partner with you on your healthcare journey. I am thankful to be able to provide care to you today.   Wyn Quaker FNP-C

## 2018-09-14 NOTE — Assessment & Plan Note (Signed)
Assessment: Resolved bronchitis after antibiotic treatment and prednisone  Plan: No further interventions needed Follow-up with our office in July/2020 or sooner if symptoms worsen

## 2018-09-14 NOTE — Assessment & Plan Note (Signed)
Assessment: Known interstitial lung disease most likely due to rheumatoid arthritis Currently maintained on Rituxan High-resolution CT favoring chronic HP  Plan: Continue follow-up with [facility] rheumatology Continue follow-up with Dr. Chase Caller in July/2020 Spirometry with DLCO to be completed prior to seeing Dr. Chase Caller in July/2020 >>> If worsening lung function could consider anti-fibrotic's if we have a drug indication by that point in time Continue planned Rituxan infusion

## 2018-09-14 NOTE — Assessment & Plan Note (Addendum)
Assessment: Lungs assessment reveals no wheezing Bibasilar crackles right greater than left Maintained on Dulera 200 today wondering if she can be decreased down to [facility] 100 Resolved asthma exacerbation  Plan: Resume Dulera 100 Notify our office if you have increased wheezing or increased Saba use Follow-up with Dr. Chase Caller in July/2020

## 2018-09-14 NOTE — Progress Notes (Signed)
@Patient  ID: Stacey [name], female    DOB: April 15, 1941, 78 y.o.   MRN: [id number]  Chief Complaint  Patient presents with  . 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 Chase Caller  09/14/2018  - Visit   78 year old female never smoker presenting to our office today for a two-week follow-up visit after being treated for bronchitis.  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  Patient is on Rituxan therapy for management.  Patient also with known rheumatoid arthritis.  She is managed by [facility] rheumatology for this.  Patient reports complete resolution of her symptoms.  Patient was also trialed on Dulera 200 during this course of her illness.  Patient is wondering if she can go back to [facility] 100 if she feels that [facility] 200 may have made her more "antsy".  Patient reports she has not needed to use her rescue inhaler since last office visit.  MMRC - Breathlessness Score 1 - I get short of breath when hurrying on level ground or walking up a slight hill    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  11/06/2016-CT chest high-res- compatible with ILD, 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, chronic areas of pleural-parenchymal thickening and calcification again noted throughout the apical portions of the lungs bilaterally most compatible with areas of chronic postinfectious or inflammatory scarring  07/21/2018-pulmonary function test- FVC 2.18 (77% predicted), FEV1 1.72 (81% predicted) ratio 79 Normal spirometry with possible restriction 01/24/2018-echocardiogram-LV ejection fraction 55 to 40%, grade 1 diastolic dysfunction  FENO:  Lab Results  Component Value Date   NITRICOXIDE 30 02/25/2018    PFT: PFT Results Latest Ref Rng & Units 07/21/2018 06/07/2018 05/17/2018 01/10/2018 01/11/2017 02/07/2016 07/17/2013  FVC-Pre L 2.18 1.81 1.99 1.93 2.14 2.16 2.13  FVC-Predicted Pre % 77 68 75 65 71 71 68  FVC-Post L - - - 1.99 2.17 2.24 2.24  FVC-Predicted Post % - - - 67 72 74 71  Pre FEV1/FVC % % 79 69 75 77 77 76 81  Post FEV1/FCV % % - - - 78 78 77 82  FEV1-Pre L 1.72 1.25 1.50 1.48 1.66 1.65 1.73  FEV1-Predicted Pre % 81 63 76 67 74 72 73  FEV1-Post L - - - 1.55 1.69 1.73 1.85  DLCO UNC% % - 59 70 59 61 67 69  DLCO COR %Predicted % - 80 97 81 74 89 85  TLC L - - - 4.40 5.11 5.21 4.85  TLC % Predicted % - - - 82 95 97 90  RV % Predicted % - - - 102 131 133 107    Imaging: No results found.    Specialty Problems      Pulmonary Problems   Asthma   Cough variant asthma vs uacs     Followed in Pulmonary clinic/ [facility]/ [facility] -  04/18/15 rec max rx for gerd > resolve - recurred Feb 2018 > max gerd rx added 09/25/2016  - 09/25/2016  After extensive coaching HFA effectiveness =    75% so try symbicort 80 2bid x one sample only  - improved on higher doses of pred and saba by report 10/22/2016 so rec medrol 4 mg double dose until better then resume previous dose  - flutter valve teaching done 10/22/2016  And repeated 01/17/2018 as well as neb training  - refractory flare onset 01/11/18 rec medrol up to 32 qd and cyclical cough rx with tyl#3  - DgEs   01/25/18    Spontaneous reflux to level of the midthoracic esophagus when patient is placed in the supine position - Asthma exac. 02/25/18  FENO 30, medrol taper (12mg  x 3 days, 8mg  x 3 days then cont 4mg  qd) - Allergy profile 03/21/18  >  Eos 1.1  /  IgE  5 rast neg      Multiple pulmonary nodules/RA lung dz     Followed in Pulmonary clinic/ [facility]/ [facility]  - See CT 05/16/13  1. The suspected pulmonary nodule in the right lung apex is actually  a calcified posterior pleural plaque and is benign in appearance.  2. Nodularity in the lateral aspect of the right upper lobe is felt  to be inflammatory in origin. However, there is a 9 mm nodule in  this area.  - PFT's 07/17/13 VC 74% with no obst and dlco 69 correctrs to 85% - CT 10/16/13 1. Stable chronic lung disease with scattered subpleural scarring and asymmetric right upper lobe subpleural nodularity, likely postinflammatory (prior granulomatous disease). 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 2. No suspicious pulmonary nodule or acute process demonstrated. - CT 10/21/2015 Mild growth of irregular 1.0 cm subpleural left upper lobe pulmonary nodule since 10/16/2013, cannot exclude an indolent primary bronchogenic carcinoma.  -  PET 11/01/2015 > neg with ild c/w RA - PFT's  02/07/2016  VC  1.99 (65%)   study with DLCO  67/68 % corrects to 89 % for alv volume   - HRCT rec p 10 days of levaquin 10/23/2016  - HRCT 11/06/2016  1. The appearance of the chest remains compatible with interstitial lung disease. 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 No significant progression of findings compared to the prior study. 2. Chronic areas of pleural-parenchymal thickening/calcification again noted throughout the apical portions of the lungs bilaterally, most compatible with areas of chronic post infectious or inflammatory scarring. - PFT's  01/11/2017   VC 1.91  (64%) s obst or curvature   p nothing prior to study with DLCO  61/59c % corrects to 74  % for alv volume   - PFT's  01/10/2018  VC  1.88 (63%) s significant obst p nothing prior to study with DLCO  59 % corrects to 81  % for alv volume          Restrictive lung disease    Followed in Pulmonary clinic/ [facility]/ [facility] - PFT's 07/17/13 VC 74% with no obst and dlco 69 correctrs to 85% - PFT's 01/11/2017  VC  64% and dlco 61/69 c corrects to 74  - PFTs 01/10/18 - FVC 1.99 (67%), FEV1 1.55 (70%), Ratio 78, DLCOunc 16.09 (59%)      Dyspnea   Interstitial lung disease due to connective tissue disease ([place])   Chronic lung disease   Hypoxia   DOE (  dyspnea on exertion)   Asthma exacerbation   Acute respiratory failure with hypoxia (HCC)   Chronic respiratory failure with hypoxia (HCC)   Acute bronchitis      Allergies  Allergen Reactions  . Penicillins Other (See Comments)    Reaction unknown occurred during childhood Has patient had a PCN reaction causing immediate rash, facial/tongue/throat swelling, SOB or lightheadedness with hypotension: Unknown Has patient had a PCN reaction causing severe rash involving mucus membranes or skin necrosis: Unknown Has patient had a PCN reaction that required hospitalization: No Has patient had a PCN reaction occurring within the last 10 years: No If all of the above answers are "NO", then may proceed with Cephalosporin use.  Unsur  . Remicade [Infliximab] Other (See Comments)    Reaction unknown  . Sulfa Antibiotics Other (See Comments)    Reaction unknown  . Sulfamethoxazole Other (See Comments)    Unsure of reaction As a child not sure     Immunization History  Administered Date(s) Administered  . Influenza Split 04/12/2013, 04/12/2014, 03/14/2015, 04/23/2016  . Influenza, High Dose Seasonal PF 04/12/2017, 03/24/2018  . Influenza-Unspecified 04/03/2013, 04/22/2017  . Pneumococcal Conjugate-13 04/03/2013, 09/28/2014  . Pneumococcal  Polysaccharide-23 07/13/2012, 07/24/2013  . Tdap 12/10/2017    Past Medical History:  Diagnosis Date  . Abnormal finding of blood chemistry   . Asthma   . H/O measles   . H/O varicella   . Hypertension   . 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[place])   . 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 Lichen sclerosus 04/28/50   Asymptomatic  . Low iron   . Mitral valve prolapse   . Osteoarthritis   . Osteoporosis   . Pneumonia   . Post herpetic neuralgia   . Rheumatoid arthritis(714.0)   . Yeast infection     Tobacco History: Social History   Tobacco Use  Smoking Status Never Smoker  Smokeless Tobacco Never Used   Counseling given: Yes  Continue to not smoke  Outpatient Encounter Medications as of 09/14/2018  Medication Sig  . albuterol (PROAIR HFA) 108 (90 Base) MCG/ACT inhaler Inhale 2 puffs into the lungs every 4 (four) hours as needed for wheezing or shortness of breath.  Marland Kitchen albuterol (PROVENTIL) (2.5 MG/3ML) 0.083% nebulizer solution Inhale 3 mLs (2.5 mg total) into the lungs every 4 (four) hours as needed for wheezing or shortness of breath.  Marland Kitchen amitriptyline (ELAVIL) 25 MG tablet Take 25 mg by mouth at bedtime.  . beta carotene w/minerals (OCUVITE) tablet Take 1 tablet by mouth daily.  . cholecalciferol (VITAMIN D) 1000 UNITS tablet Take 1,000 Units by mouth daily.  Marland Kitchen dextromethorphan (DELSYM) 30 MG/5ML liquid Take 2.5 mLs (15 mg total) by mouth at bedtime. (Patient taking differently: Take 15 mg by mouth as needed. )  . gabapentin (NEURONTIN) 300 MG capsule Take 600 mg by mouth at bedtime.  . levalbuterol (XOPENEX HFA) 45 MCG/ACT inhaler Inhale 2 puffs into the lungs every 4 (four) hours as needed for wheezing.  . levalbuterol (XOPENEX) 0.63 MG/3ML nebulizer solution Inhale 0.63 mg into the lungs 2 (two) times daily as needed for wheezing or shortness of breath.   . losartan (COZAAR) 25 MG tablet Take 25 mg by mouth daily.  . mometasone-formoterol (DULERA) 200-5 MCG/ACT AERO Inhale 2 puffs  into the lungs 2 (two) times daily.  . pantoprazole (PROTONIX) 40 MG tablet Take 1 tablet (40 mg total) by mouth 2 (two) times daily before a meal.  . polyethylene glycol (MIRALAX / GLYCOLAX) packet Take  17 g by mouth daily. (Patient taking differently: Take 17 g by mouth daily as needed (For constipation.). )  . riTUXimab (RITUXAN) 100 MG/10ML injection Inject into the vein. Receives at [facility] but unsure of dose - no more doses for next 6 months as of 09/14/18  . venlafaxine XR (EFFEXOR-XR) 75 MG 24 hr capsule Take 75 mg by mouth at bedtime.   . [DISCONTINUED] doxycycline (VIBRA-TABS) 100 MG tablet Take 1 tablet (100 mg total) by mouth 2 (two) times daily.  . [DISCONTINUED] methylPREDNISolone (MEDROL) 8 MG tablet Take 4 tablets daily x 2 days, then 2 tablets daily x 2 days, then 1 tablet daily x 2 days  . [DISCONTINUED] predniSONE (DELTASONE) 10 MG tablet 4 tabs for 2 days, then 3 tabs for 2 days, 2 tabs for 2 days, then 1 tab for 2 days, then stop  . mometasone-formoterol (DULERA) 100-5 MCG/ACT AERO Inhale 2 puffs into the lungs 2 (two) times daily. (Patient not taking: Reported on 09/14/2018)   No facility-administered encounter medications on file as of 09/14/2018.      Review of Systems  Review of Systems  Constitutional: Negative for chills, fatigue, fever and unexpected weight change.  HENT: Negative for congestion, postnasal drip, sinus pressure and sinus pain.   Respiratory: Negative for cough, chest tightness, shortness of breath and wheezing.   Cardiovascular: Negative for chest pain and palpitations.  Gastrointestinal: Negative for diarrhea, nausea and vomiting.  Musculoskeletal: Negative for arthralgias.  Skin: Negative for color change.  Allergic/Immunologic: Negative for environmental allergies and food allergies.  Neurological: Negative for dizziness, light-headedness and headaches.  Psychiatric/Behavioral: Negative for dysphoric mood. The patient is not nervous/anxious.   All  other systems reviewed and are negative.    Physical Exam  BP 132/64 (BP Location: Left Arm, Patient Position: Sitting, Cuff Size: Normal)   Pulse 72   Ht 5\' 6"  (1.676 m)   Wt 143 lb (64.9 kg)   SpO2 95%   BMI 23.08 kg/m   Wt Readings from Last 5 Encounters:  09/14/18 143 lb (64.9 kg)  08/30/18 143 lb (64.9 kg)  07/21/18 140 lb (63.5 kg)  07/18/18 140 lb (63.5 kg)  06/16/18 141 lb (64 kg)    Physical Exam  Constitutional: She is oriented to person, place, and time and well-developed, well-nourished, and in no distress. No distress.  HENT:  Head: Normocephalic and atraumatic.  Right Ear: Hearing, tympanic membrane, external ear and ear canal normal.  Left Ear: Hearing, tympanic membrane, external ear and ear canal normal.  Nose: Nose normal. Right sinus exhibits no maxillary sinus tenderness and no frontal sinus tenderness. Left sinus exhibits no maxillary sinus tenderness and no frontal sinus tenderness.  Mouth/Throat: Uvula is midline and oropharynx is clear and moist. No oropharyngeal exudate.  Eyes: Pupils are equal, round, and reactive to light.  Neck: Normal range of motion. Neck supple.  Cardiovascular: Normal rate, regular rhythm and normal heart sounds.  Pulmonary/Chest: Effort normal and breath sounds normal. No accessory muscle usage. No respiratory distress. She has no decreased breath sounds. She has no wheezes. She has no rhonchi.  Bibasilar crackles, right greater than left  Musculoskeletal: Normal range of motion.        General: No edema.  Lymphadenopathy:    She has no cervical adenopathy.  Neurological: She is alert and oriented to person, place, and time. Gait normal.  Skin: Skin is warm and dry. She is not diaphoretic. No erythema.  Psychiatric: Mood, memory, affect and judgment normal.  Nursing note and vitals reviewed.     Lab Results:  CBC    Component Value Date/Time   WBC 9.5 07/18/2018 1401   RBC 4.49 07/18/2018 1401   HGB 13.0 07/18/2018  1401   HGB LA11 03/26/2018 0507   HCT 39.6 07/18/2018 1401   PLT 260.0 07/18/2018 1401   MCV 88.3 07/18/2018 1401   MCH 30.0 03/27/2018 0509   MCHC 32.9 07/18/2018 1401   RDW 15.0 07/18/2018 1401   LYMPHSABS 2.8 07/18/2018 1401   MONOABS 0.6 07/18/2018 1401   EOSABS 0.3 07/18/2018 1401   BASOSABS 0.1 07/18/2018 1401    BMET    Component Value Date/Time   NA 142 03/27/2018 0509   K 4.5 03/27/2018 0509   CL 102 03/27/2018 0509   CO2 30 03/27/2018 0509   GLUCOSE 130 (H) 03/27/2018 0509   BUN 21 03/27/2018 0509   CREATININE 0.57 03/29/2018 0525   CALCIUM 9.0 03/27/2018 0509   GFRNONAA >60 03/29/2018 0525   GFRAA >60 03/29/2018 0525    BNP    Component Value Date/Time   BNP 89.4 03/23/2018 0732    ProBNP    Component Value Date/Time   PROBNP 38.0 03/21/2018 1119      Assessment & Plan:     Interstitial lung disease due to connective tissue disease ([place]) Assessment: Known interstitial lung disease most likely due to rheumatoid arthritis Currently maintained on Rituxan High-resolution CT favoring chronic HP  Plan: Continue follow-up with [facility] rheumatology Continue follow-up with Dr. Chase Caller in July/2020 Spirometry with DLCO to be completed prior to seeing Dr. Chase Caller in July/2020 >>> If worsening lung function could consider anti-fibrotic's if we have a drug indication by that point in time Continue planned Rituxan infusion   Asthma Assessment: Lungs assessment reveals no wheezing Bibasilar crackles right greater than left Maintained on Dulera 200 today wondering if she can be decreased down to [facility] 100 Resolved asthma exacerbation  Plan: Resume Dulera 100 Notify our office if you have increased wheezing or increased Saba use Follow-up with Dr. Chase Caller in July/2020   Acute bronchitis Assessment: Resolved bronchitis after antibiotic treatment and prednisone  Plan: No further interventions needed Follow-up with our office in July/2020 or  sooner if symptoms worsen     Lauraine Rinne, NP 09/14/2018   This appointment was 18 min long with over 50% of the time in direct face-to-face patient care, assessment, plan of care, and follow-up.

## 2018-09-19 DIAGNOSIS — Z5181 Encounter for therapeutic drug level monitoring: Secondary | ICD-10-CM | POA: Diagnosis not present

## 2018-09-19 DIAGNOSIS — J849 Interstitial pulmonary disease, unspecified: Secondary | ICD-10-CM | POA: Diagnosis not present

## 2018-09-19 DIAGNOSIS — M0579 Rheumatoid arthritis with rheumatoid factor of multiple sites without organ or systems involvement: Secondary | ICD-10-CM | POA: Diagnosis not present

## 2018-11-07 ENCOUNTER — Telehealth: Payer: Self-pay | Admitting: Pulmonary Disease

## 2018-11-07 MED ORDER — MOMETASONE FURO-FORMOTEROL FUM 100-5 MCG/ACT IN AERO: 2.0000 | INHALATION_SPRAY | Freq: Two times a day (BID) | RESPIRATORY_TRACT | 11 refills | Status: DC

## 2018-11-07 MED ORDER — PREDNISONE 10 MG PO TABS: ORAL_TABLET | ORAL | 0 refills | Status: DC

## 2018-11-07 MED ORDER — DOXYCYCLINE HYCLATE 100 MG PO TABS: 100.0000 mg | ORAL_TABLET | Freq: Two times a day (BID) | ORAL | 0 refills | Status: DC

## 2018-11-07 NOTE — Telephone Encounter (Signed)
Ok to change back to [facility] 100. Will send in abx and steroid for wheezing. Take mucinex twice a day and add daily antihistamine like claritin or zyrtec. If not improvement or symptoms worsen needs Televisit.

## 2018-11-07 NOTE — Telephone Encounter (Signed)
Called and spoke with pt letting her know the recs per Derl Barrow and that abx doxy and pred taper have been sent to pharmacy for her as well as the dulera 100 Rx that I had already sent in. Stated to pt if no better after these meds, we would need to schedule televisit and pt expressed understanding. Nothing further needed.

## 2018-11-07 NOTE — Telephone Encounter (Signed)
Primary Pulmonologist: MR Last office visit and with whom: 09/14/2018 with Wyn Quaker What do we see them for (pulmonary problems): ILD/asthma Last OV assessment/plan: Instructions  Return in about 4 months (around 01/14/2019), or if symptoms worsen or fail to improve, for Follow up with Dr. Purnell Shoemaker.  Resume Dulera 100 >>> 2 puffs in the morning right when you wake up, rinse out your mouth after use, 12 hours later 2 puffs, rinse after use >>> Take this daily, no matter what >>> This is not a rescue inhaler   Only use your albuterol as a rescue medication to be used if you can't catch your breath by resting or doing a relaxed purse lip breathing pattern.  - The less you use it, the better it will work when you need it. - Ok to use up to 2 puffs every 4 hours if you must but call for immediate appointment if use goes up over your usual need - Don't leave home without it !! (think of it like the spare tire for your car)   Continue follow-up with [facility]ology  Continue Rituxan infusions  Schedule a follow-up spirometry with DLCO prior to your office visit with Dr. Chase Caller in July/2020 Schedule a follow-up visit with Dr. Chase Caller in July/2020 in ILD clinic >>>4min visit        Was appointment offered to patient (explain)?  Pt would like to have meds prescribed   Reason for call: called and spoke with pt who stated she has been coughing with green phlegm and has been wheezing x2 weeks now. Pt states mucus is mainly thick white but occ mucus does have the green tinge to it.  Pt denies any fever, body aches, or chills. Pt also denies any chest tightness or worsening SOB.  Pt has tried delsym to see if it would help with her cough. Asked pt if she had tried any mucinex and pt stated she had not. Pt has been taking her dulera as prescribed. Pt is prescribed Dulera 100 but was given a sample of Dulera 200 after last OV with Aaron Edelman in February but pt stated the [facility] 200 made pt  not be able to sleep. Pt stated she is needing a new Rx of Dulera 100 so I have sent that to pt's pharmacy. Pt stated that she has not had to use her rescue inhaler and has not had to use her nebulizer.  Due to pt coughing and wheezing, pt would like to know if something could be prescribed to help with symptoms. Beth, please advise on this for pt. Thanks!

## 2018-12-07 ENCOUNTER — Telehealth: Payer: Self-pay | Admitting: Pulmonary Disease

## 2018-12-07 MED ORDER — ALBUTEROL SULFATE (2.5 MG/3ML) 0.083% IN NEBU: 2.5000 mg | INHALATION_SOLUTION | RESPIRATORY_TRACT | 3 refills | Status: DC | PRN

## 2018-12-07 NOTE — Telephone Encounter (Signed)
Spoke with patient. She stated that she will try the albuterol nebs first. Asked patient if she needed a refill on albuterol as it appears she had a small amount called back in September 2019, she replied yes. Refill has been sent to [facility].   Nothing further needed at time of call.

## 2018-12-07 NOTE — Telephone Encounter (Signed)
Primary Pulmonologist: MR Last office visit and with whom: Aaron Edelman, 09/14/18 What do we see them for (pulmonary problems): ILD Last OV assessment/plan:   Instructions      Return in about 4 months (around 01/14/2019), or if symptoms worsen or fail to improve, for Follow up with Dr. Purnell Shoemaker.  Resume Dulera 100 >>> 2 puffs in the morning right when you wake up, rinse out your mouth after use, 12 hours later 2 puffs, rinse after use >>> Take this daily, no matter what >>> This is not a rescue inhaler   Only use your albuterol as a rescue medication to be used if you can't catch your breath by resting or doing a relaxed purse lip breathing pattern.  - The less you use it, the better it will work when you need it. - Ok to use up to 2 puffs every 4 hours if you must but call for immediate appointment if use goes up over your usual need - Don't leave home without it !! (think of it like the spare tire for your car)   Continue follow-up with [facility]ology  Continue Rituxan infusions  Schedule a follow-up spirometry with DLCO prior to your office visit with Dr. Chase Caller in July/2020 Schedule a follow-up visit with Dr. Chase Caller in July/2020 in ILD clinic >>>4min visit   It is flu season:   >>> Best ways to protect herself from the flu: Receive the yearly flu vaccine, practice good hand hygiene washing with soap and also using hand sanitizer when available, eat a nutritious meals, get adequate rest, hydrate appropriately   Please contact the office if your symptoms worsen or you have concerns that you are not improving.   Thank you for choosing [facility] for your healthcare, and for allowing [place] to partner with you on your healthcare journey. I am thankful to be able to provide care to you today.   Wyn Quaker FNP-C        Reason for call:   Patient stated she is having increased SHOB, cough, with occasional thick white, green sputum.  Patient stated it  started around 2 weeks ago. Patient is using Delsym, that works.  Patient stated she is not using nebs as much as prescribed.  Patient stated she did not want prednisone or antibiotic again if possible.  Patient is concerned taking prednisone or antibiotics very much.  Patient is asking for recommendations.  Message routed to Aaron Edelman, NP to recommendations

## 2018-12-07 NOTE — Telephone Encounter (Signed)
How often has the patient been using her nebulized medications?  She has albuterol as well as Xopenex nebulized medications if she using either of these she should be using 1 or the other.  Patient has been using her Dulera?  Is using any allergy medications?Wyn Quaker, FNP

## 2018-12-07 NOTE — Telephone Encounter (Signed)
Called and spoke with Patient.  Patient allergies are PCN, Sulfa, and Remicade.  Patient has albuterol and xopenex nebs, but hasn't used either. Patient has Albuterol inhaler, but has not used it either.  Patient is taking Dulera 100 2 puffs, twice a day.  Message routed to Brent, NP

## 2018-12-07 NOTE — Telephone Encounter (Signed)
I would recommend first the patient uses her nebulized medications such as albuterol every 6 hours as needed for when she is having these cough-like episodes.  To see if this helps.  If patient symptoms worsen or mucus production increases or continues to persist then she may need to consider antibiotic therapy.  If the patient is not wheezing then I think we can hold off on steroids she can just continue to use her as needed bronchodilators.  Wyn Quaker, FNP

## 2018-12-09 ENCOUNTER — Telehealth: Payer: Self-pay | Admitting: Pulmonary Disease

## 2018-12-09 MED ORDER — PREDNISONE 10 MG PO TABS: ORAL_TABLET | ORAL | 0 refills | Status: DC

## 2018-12-09 MED ORDER — DOXYCYCLINE HYCLATE 100 MG PO TABS: 100.0000 mg | ORAL_TABLET | Freq: Two times a day (BID) | ORAL | 0 refills | Status: DC

## 2018-12-09 NOTE — Telephone Encounter (Signed)
Spoke with patient. She is aware of Brian's recommendations. Will go ahead and send in medications.   Nothing further needed at time of call.

## 2018-12-09 NOTE — Telephone Encounter (Signed)
Can offer patient:  Doxycycline >>> 1 100 mg tablet every 12 hours for 7 days >>>take with food  >>>wear sunscreen   Prednisone 10mg  tablet  >>>Take 2 tablets (20 mg total) daily for the next 5 days >>> Take with food in the morning  Place the order.    If patient do not improve then she will need either a video visit or office visit for further evaluation.  If symptoms worsen over the weekend she will need to present to the emergency room.   Wyn Quaker FNP

## 2018-12-09 NOTE — Telephone Encounter (Signed)
Primary Pulmonologist: MR Last office visit and with whom: 09/14/2018 with Wyn Quaker What do we see them for (pulmonary problems): ild Last OV assessment/plan: Instructions  Return in about 4 months (around 01/14/2019), or if symptoms worsen or fail to improve, for Follow up with Dr. Purnell Shoemaker.  Resume Dulera 100 >>> 2 puffs in the morning right when you wake up, rinse out your mouth after use, 12 hours later 2 puffs, rinse after use >>> Take this daily, no matter what >>> This is not a rescue inhaler   Only use your albuterol as a rescue medication to be used if you can't catch your breath by resting or doing a relaxed purse lip breathing pattern.  - The less you use it, the better it will work when you need it. - Ok to use up to 2 puffs every 4 hours if you must but call for immediate appointment if use goes up over your usual need - Don't leave home without it !! (think of it like the spare tire for your car)   Continue follow-up with [facility]  Continue Rituxan infusions  Schedule a follow-up spirometry with [facility] prior to your office visit with Dr. Chase Caller in July/2020 Schedule a follow-up visit with Dr. Chase Caller in July/2020 in [facility] clinic >>>42min visit   It is flu season:   >>> Best ways to protect herself from the flu: Receive the yearly flu vaccine, practice good hand hygiene washing with soap and also using hand sanitizer when available, eat a nutritious meals, get adequate rest, hydrate appropriately   Please contact the office if your symptoms worsen or you have concerns that you are not improving.   Thank you for choosing [facility] for your healthcare, and for allowing [place] to partner with you on your healthcare journey. I am thankful to be able to provide care to you today.   Wyn Quaker FNP-C       Was appointment offered to patient (explain)?  Pt wants recommendations   Reason for call: Called and spoke with pt who stated for about a  week, she has had increased SOB and has also been coughing too and is coughing up clear to occ green mucus. Pt also has complaints of wheezing.  Pt states she has been using nebulizer about three times daily but states she has not had to use her rescue inhaler. Pt has been doing her dulera inhaler as prescribed and has also been taking delsym every 12 hours which she states has helped with the cough.  Pt denies any desat with O2 levels and denies any complaints of fever. Pt also denies any complaints of chest tightness, body aches or chills.  Pt wants to know what we recommend to help with her symptoms. Aaron Edelman, please advise on this for pt. Thanks!

## 2018-12-17 ENCOUNTER — Emergency Department (HOSPITAL_COMMUNITY): Payer: Medicare Other

## 2018-12-17 ENCOUNTER — Inpatient Hospital Stay (HOSPITAL_COMMUNITY)
Admission: EM | Admit: 2018-12-17 | Discharge: 2018-12-24 | DRG: 202 | Disposition: A | Discharge status: Home / Self Care | Payer: Medicare Other | Attending: Internal Medicine | Admitting: Internal Medicine

## 2018-12-17 ENCOUNTER — Other Ambulatory Visit: Payer: Self-pay

## 2018-12-17 DIAGNOSIS — R0602 Shortness of breath: Secondary | ICD-10-CM

## 2018-12-17 DIAGNOSIS — M069 Rheumatoid arthritis, unspecified: Secondary | ICD-10-CM

## 2018-12-17 DIAGNOSIS — R062 Wheezing: Secondary | ICD-10-CM | POA: Diagnosis not present

## 2018-12-17 DIAGNOSIS — B0229 Other postherpetic nervous system involvement: Secondary | ICD-10-CM | POA: Diagnosis present

## 2018-12-17 DIAGNOSIS — Z9049 Acquired absence of other specified parts of digestive tract: Secondary | ICD-10-CM

## 2018-12-17 DIAGNOSIS — K219 Gastro-esophageal reflux disease without esophagitis: Secondary | ICD-10-CM | POA: Diagnosis present

## 2018-12-17 DIAGNOSIS — Z825 Family history of asthma and other chronic lower respiratory diseases: Secondary | ICD-10-CM

## 2018-12-17 DIAGNOSIS — I080 Rheumatic disorders of both mitral and aortic valves: Secondary | ICD-10-CM | POA: Diagnosis present

## 2018-12-17 DIAGNOSIS — J984 Other disorders of lung: Secondary | ICD-10-CM | POA: Diagnosis not present

## 2018-12-17 DIAGNOSIS — J9621 Acute and chronic respiratory failure with hypoxia: Secondary | ICD-10-CM | POA: Diagnosis present

## 2018-12-17 DIAGNOSIS — J309 Allergic rhinitis, unspecified: Secondary | ICD-10-CM | POA: Diagnosis present

## 2018-12-17 DIAGNOSIS — R778 Other specified abnormalities of plasma proteins: Secondary | ICD-10-CM

## 2018-12-17 DIAGNOSIS — Z882 Allergy status to sulfonamides status: Secondary | ICD-10-CM

## 2018-12-17 DIAGNOSIS — J9601 Acute respiratory failure with hypoxia: Secondary | ICD-10-CM | POA: Diagnosis not present

## 2018-12-17 DIAGNOSIS — M059 Rheumatoid arthritis with rheumatoid factor, unspecified: Secondary | ICD-10-CM | POA: Diagnosis present

## 2018-12-17 DIAGNOSIS — J45901 Unspecified asthma with (acute) exacerbation: Secondary | ICD-10-CM | POA: Diagnosis not present

## 2018-12-17 DIAGNOSIS — Z20828 Contact with and (suspected) exposure to other viral communicable diseases: Secondary | ICD-10-CM | POA: Diagnosis present

## 2018-12-17 DIAGNOSIS — Z79899 Other long term (current) drug therapy: Secondary | ICD-10-CM

## 2018-12-17 DIAGNOSIS — M8580 Other specified disorders of bone density and structure, unspecified site: Secondary | ICD-10-CM | POA: Diagnosis present

## 2018-12-17 DIAGNOSIS — R0689 Other abnormalities of breathing: Secondary | ICD-10-CM | POA: Diagnosis not present

## 2018-12-17 DIAGNOSIS — J9811 Atelectasis: Secondary | ICD-10-CM

## 2018-12-17 DIAGNOSIS — M810 Age-related osteoporosis without current pathological fracture: Secondary | ICD-10-CM | POA: Diagnosis present

## 2018-12-17 DIAGNOSIS — Z7952 Long term (current) use of systemic steroids: Secondary | ICD-10-CM

## 2018-12-17 DIAGNOSIS — R0902 Hypoxemia: Secondary | ICD-10-CM | POA: Diagnosis not present

## 2018-12-17 DIAGNOSIS — J849 Interstitial pulmonary disease, unspecified: Secondary | ICD-10-CM | POA: Diagnosis present

## 2018-12-17 DIAGNOSIS — Z888 Allergy status to other drugs, medicaments and biological substances status: Secondary | ICD-10-CM

## 2018-12-17 DIAGNOSIS — I10 Essential (primary) hypertension: Secondary | ICD-10-CM | POA: Diagnosis present

## 2018-12-17 DIAGNOSIS — Z803 Family history of malignant neoplasm of breast: Secondary | ICD-10-CM

## 2018-12-17 DIAGNOSIS — E785 Hyperlipidemia, unspecified: Secondary | ICD-10-CM | POA: Diagnosis present

## 2018-12-17 DIAGNOSIS — I21A1 Myocardial infarction type 2: Secondary | ICD-10-CM | POA: Diagnosis not present

## 2018-12-17 DIAGNOSIS — I2510 Atherosclerotic heart disease of native coronary artery without angina pectoris: Secondary | ICD-10-CM | POA: Diagnosis present

## 2018-12-17 DIAGNOSIS — R000 Tachycardia, unspecified: Secondary | ICD-10-CM | POA: Diagnosis not present

## 2018-12-17 DIAGNOSIS — R7989 Other specified abnormal findings of blood chemistry: Secondary | ICD-10-CM | POA: Diagnosis not present

## 2018-12-17 DIAGNOSIS — Z880 Allergy status to penicillin: Secondary | ICD-10-CM

## 2018-12-17 MED ORDER — ALBUTEROL SULFATE HFA 108 (90 BASE) MCG/ACT IN AERS
8.0000 | INHALATION_SPRAY | RESPIRATORY_TRACT | Status: DC | PRN
  Administered 2018-12-18: 8 via RESPIRATORY_TRACT
  Filled 2018-12-17: qty 6.7

## 2018-12-17 NOTE — ED Notes (Signed)
Bed: WA19 Expected date:  Expected time:  Means of arrival:  Comments: 

## 2018-12-17 NOTE — ED Triage Notes (Signed)
Pt came to the ED with hx of interstitial lung dx.  Pt has been sob most of the day at home.  EMS upon arrival had a room air 02 sat of 90%.  Pt was placed on NRB and went up to 100%.  Pt was given 125mg  of solumedrol, and 2MG  of Mag IV.  Pt took duoneb at home around 1900 with little relief.

## 2018-12-17 NOTE — ED Notes (Signed)
RT called for ABG.

## 2018-12-18 ENCOUNTER — Observation Stay (HOSPITAL_BASED_OUTPATIENT_CLINIC_OR_DEPARTMENT_OTHER): Payer: Medicare Other

## 2018-12-18 ENCOUNTER — Encounter (HOSPITAL_COMMUNITY): Payer: Self-pay

## 2018-12-18 ENCOUNTER — Other Ambulatory Visit: Payer: Self-pay

## 2018-12-18 DIAGNOSIS — B0229 Other postherpetic nervous system involvement: Secondary | ICD-10-CM | POA: Diagnosis not present

## 2018-12-18 DIAGNOSIS — I10 Essential (primary) hypertension: Secondary | ICD-10-CM | POA: Diagnosis not present

## 2018-12-18 DIAGNOSIS — J45901 Unspecified asthma with (acute) exacerbation: Principal | ICD-10-CM

## 2018-12-18 DIAGNOSIS — M059 Rheumatoid arthritis with rheumatoid factor, unspecified: Secondary | ICD-10-CM | POA: Diagnosis not present

## 2018-12-18 DIAGNOSIS — R7989 Other specified abnormal findings of blood chemistry: Secondary | ICD-10-CM

## 2018-12-18 DIAGNOSIS — I21A1 Myocardial infarction type 2: Secondary | ICD-10-CM | POA: Diagnosis not present

## 2018-12-18 DIAGNOSIS — R778 Other specified abnormalities of plasma proteins: Secondary | ICD-10-CM

## 2018-12-18 DIAGNOSIS — J849 Interstitial pulmonary disease, unspecified: Secondary | ICD-10-CM | POA: Diagnosis not present

## 2018-12-18 DIAGNOSIS — K219 Gastro-esophageal reflux disease without esophagitis: Secondary | ICD-10-CM | POA: Diagnosis not present

## 2018-12-18 DIAGNOSIS — R0602 Shortness of breath: Secondary | ICD-10-CM | POA: Diagnosis not present

## 2018-12-18 DIAGNOSIS — I361 Nonrheumatic tricuspid (valve) insufficiency: Secondary | ICD-10-CM

## 2018-12-18 DIAGNOSIS — J9621 Acute and chronic respiratory failure with hypoxia: Secondary | ICD-10-CM | POA: Diagnosis not present

## 2018-12-18 DIAGNOSIS — I080 Rheumatic disorders of both mitral and aortic valves: Secondary | ICD-10-CM | POA: Diagnosis not present

## 2018-12-18 DIAGNOSIS — M810 Age-related osteoporosis without current pathological fracture: Secondary | ICD-10-CM | POA: Diagnosis not present

## 2018-12-18 DIAGNOSIS — Z20828 Contact with and (suspected) exposure to other viral communicable diseases: Secondary | ICD-10-CM | POA: Diagnosis not present

## 2018-12-18 DIAGNOSIS — E785 Hyperlipidemia, unspecified: Secondary | ICD-10-CM | POA: Diagnosis not present

## 2018-12-18 LAB — BLOOD GAS, ARTERIAL
Acid-Base Excess: 4.6 mmol/L — ABNORMAL HIGH (ref 0.0–2.0)
Bicarbonate: 29.3 mmol/L — ABNORMAL HIGH (ref 20.0–28.0)
Drawn by: 308601
FIO2: 100
O2 Saturation: 95.4 %
Patient temperature: 98.6
pCO2 arterial: 46.1 mmHg (ref 32.0–48.0)
pH, Arterial: 7.42 (ref 7.350–7.450)
pO2, Arterial: 78 mmHg — ABNORMAL LOW (ref 83.0–108.0)

## 2018-12-18 LAB — CBC
HCT: 40.6 % (ref 36.0–46.0)
Hemoglobin: 12.3 g/dL (ref 12.0–15.0)
MCH: 29.1 pg (ref 26.0–34.0)
MCHC: 30.3 g/dL (ref 30.0–36.0)
MCV: 96 fL (ref 80.0–100.0)
Platelets: 345 10*3/uL (ref 150–400)
RBC: 4.23 MIL/uL (ref 3.87–5.11)
RDW: 13.7 % (ref 11.5–15.5)
WBC: 11.2 10*3/uL — ABNORMAL HIGH (ref 4.0–10.5)
nRBC: 0 % (ref 0.0–0.2)

## 2018-12-18 LAB — BASIC METABOLIC PANEL
Anion gap: 9 (ref 5–15)
BUN: 13 mg/dL (ref 8–23)
CO2: 29 mmol/L (ref 22–32)
Calcium: 8.9 mg/dL (ref 8.9–10.3)
Chloride: 101 mmol/L (ref 98–111)
Creatinine, Ser: 0.79 mg/dL (ref 0.44–1.00)
GFR calc Af Amer: >60 mL/min (ref >60)
GFR calc non Af Amer: >60 mL/min (ref >60)
Glucose, Bld: 107 mg/dL — ABNORMAL HIGH (ref 70–99)
Potassium: 4.2 mmol/L (ref 3.5–5.1)
Sodium: 139 mmol/L (ref 135–145)

## 2018-12-18 LAB — CBC WITH DIFFERENTIAL/PLATELET
Abs Immature Granulocytes: 0.09 10*3/uL — ABNORMAL HIGH (ref 0.00–0.07)
Basophils Absolute: 0.1 10*3/uL (ref 0.0–0.1)
Basophils Relative: 1 %
Eosinophils Absolute: 0.8 10*3/uL — ABNORMAL HIGH (ref 0.0–0.5)
Eosinophils Relative: 6 %
HCT: 44.1 % (ref 36.0–46.0)
Hemoglobin: 13.9 g/dL (ref 12.0–15.0)
Immature Granulocytes: 1 %
Lymphocytes Relative: 28 %
Lymphs Abs: 4.1 10*3/uL — ABNORMAL HIGH (ref 0.7–4.0)
MCH: 30 pg (ref 26.0–34.0)
MCHC: 31.5 g/dL (ref 30.0–36.0)
MCV: 95 fL (ref 80.0–100.0)
Monocytes Absolute: 1.7 10*3/uL — ABNORMAL HIGH (ref 0.1–1.0)
Monocytes Relative: 11 %
Neutro Abs: 8 10*3/uL — ABNORMAL HIGH (ref 1.7–7.7)
Neutrophils Relative %: 53 %
Platelets: 349 10*3/uL (ref 150–400)
RBC: 4.64 MIL/uL (ref 3.87–5.11)
RDW: 13.6 % (ref 11.5–15.5)
WBC: 14.8 10*3/uL — ABNORMAL HIGH (ref 4.0–10.5)
nRBC: 0 % (ref 0.0–0.2)

## 2018-12-18 LAB — I-STAT TROPONIN, ED: Troponin i, poc: 0.11 ng/mL (ref 0.00–0.08)

## 2018-12-18 LAB — ECHOCARDIOGRAM COMPLETE
Height: 66 in
Weight: 2257.51 oz

## 2018-12-18 LAB — TROPONIN I
Troponin I: 0.12 ng/mL (ref <0.03)
Troponin I: 0.66 ng/mL (ref <0.03)
Troponin I: 0.78 ng/mL (ref <0.03)
Troponin I: 1.06 ng/mL (ref <0.03)
Troponin I: 1.14 ng/mL (ref <0.03)
Troponin I: 2.1 ng/mL (ref <0.03)

## 2018-12-18 LAB — SARS CORONAVIRUS 2 BY RT PCR (HOSPITAL ORDER, PERFORMED IN ~~LOC~~ HOSPITAL LAB): SARS Coronavirus 2: NEGATIVE

## 2018-12-18 LAB — PROCALCITONIN: Procalcitonin: <0.1 ng/mL

## 2018-12-18 MED ORDER — BENZONATATE 100 MG PO CAPS
200.0000 mg | ORAL_CAPSULE | Freq: Three times a day (TID) | ORAL | Status: DC | PRN
  Administered 2018-12-18 – 2018-12-22 (×8): 200 mg via ORAL
  Filled 2018-12-18 (×8): qty 2

## 2018-12-18 MED ORDER — HYDRALAZINE HCL 20 MG/ML IJ SOLN: 5.0000 mg | INTRAMUSCULAR | Status: DC | PRN

## 2018-12-18 MED ORDER — SODIUM CHLORIDE 0.9 % IV SOLN
1.0000 g | Freq: Once | INTRAVENOUS | Status: AC
  Administered 2018-12-18: 02:00:00 1 g via INTRAVENOUS
  Filled 2018-12-18: qty 10

## 2018-12-18 MED ORDER — GUAIFENESIN ER 600 MG PO TB12
600.0000 mg | ORAL_TABLET | Freq: Two times a day (BID) | ORAL | Status: DC | PRN
  Administered 2018-12-18: 22:00:00 600 mg via ORAL
  Filled 2018-12-18: qty 1

## 2018-12-18 MED ORDER — GABAPENTIN 300 MG PO CAPS
600.0000 mg | ORAL_CAPSULE | Freq: Once | ORAL | Status: AC
  Administered 2018-12-18: 600 mg via ORAL
  Filled 2018-12-18: qty 2

## 2018-12-18 MED ORDER — ACETAMINOPHEN 325 MG PO TABS
650.0000 mg | ORAL_TABLET | Freq: Four times a day (QID) | ORAL | Status: DC | PRN
  Administered 2018-12-18: 20:00:00 650 mg via ORAL
  Filled 2018-12-18 (×2): qty 2

## 2018-12-18 MED ORDER — ALBUTEROL SULFATE (2.5 MG/3ML) 0.083% IN NEBU: 2.5000 mg | INHALATION_SOLUTION | RESPIRATORY_TRACT | Status: DC | PRN

## 2018-12-18 MED ORDER — ACETAMINOPHEN 650 MG RE SUPP: 650.0000 mg | Freq: Four times a day (QID) | RECTAL | Status: DC | PRN

## 2018-12-18 MED ORDER — SODIUM CHLORIDE 0.9 % IV SOLN
500.0000 mg | Freq: Once | INTRAVENOUS | Status: AC
  Administered 2018-12-18: 02:00:00 500 mg via INTRAVENOUS
  Filled 2018-12-18: qty 500

## 2018-12-18 MED ORDER — METHYLPREDNISOLONE SODIUM SUCC 125 MG IJ SOLR
60.0000 mg | Freq: Three times a day (TID) | INTRAMUSCULAR | Status: DC
  Administered 2018-12-18 – 2018-12-19 (×4): 60 mg via INTRAVENOUS
  Filled 2018-12-18 (×4): qty 2

## 2018-12-18 MED ORDER — IPRATROPIUM-ALBUTEROL 0.5-2.5 (3) MG/3ML IN SOLN
3.0000 mL | Freq: Four times a day (QID) | RESPIRATORY_TRACT | Status: DC
  Administered 2018-12-18 – 2018-12-23 (×23): 3 mL via RESPIRATORY_TRACT
  Filled 2018-12-18 (×21): qty 3

## 2018-12-18 MED ORDER — AMITRIPTYLINE HCL 25 MG PO TABS
25.0000 mg | ORAL_TABLET | Freq: Once | ORAL | Status: AC
  Administered 2018-12-18: 21:00:00 25 mg via ORAL
  Filled 2018-12-18: qty 1

## 2018-12-18 MED ORDER — ENOXAPARIN SODIUM 40 MG/0.4ML ~~LOC~~ SOLN
40.0000 mg | SUBCUTANEOUS | Status: DC
  Administered 2018-12-18 – 2018-12-19 (×2): 40 mg via SUBCUTANEOUS
  Filled 2018-12-18 (×2): qty 0.4

## 2018-12-18 NOTE — Progress Notes (Signed)
Patient is a 78 year old female with history of asthma, interstitial lung disease being followed by outpatient pulmonology, rheumatoid arthritis on Rituxan, hypertension, mitral valve prolapse who presented the emergency room with complaints of shortness of breath.  Patient was admitted for the management of exacerbation.  She reports that she had 2-3 exacerbations in the last 6 months.  She is on 2 L of oxygen at home as needed. Patient started on IV steroids, bronchodilators.  Covid-19 test was negative. Patient seen and examined at bedside.  Currently hemodynamically stable.  Examination reveals bilateral expiratory wheezes.  She feels better than yesterday.  Chest x-ray on presentation did not show any focal consolidation. Her troponins were also elevated up to 0.106.  Patient denies any chest pain.  EKG did not suggest any ischemic changes.I will request for cardiology consult.Echocardiogram will be ordered. We will continue to monitor her.  Patient seen by Dr. Marlowe Sax this morning

## 2018-12-18 NOTE — Progress Notes (Signed)
RT attempted BIPAP with Pt.  Pt did not tolerate well, Pt felt like she was suffocating.  Pt placed back on 2 LPM [place] and tolerating well at this time.  MD aware, RT to monitor and assess as needed.

## 2018-12-18 NOTE — ED Notes (Signed)
RT called at this time to place pt on BIPAP

## 2018-12-18 NOTE — ED Notes (Signed)
Date and time results received: 12/18/18 0058  Test: Troponin Critical Value: 0.12  Name of Provider Notified: Dr. Dina Rich  Orders Received? Or Actions Taken?: Continue to monitor

## 2018-12-18 NOTE — ED Provider Notes (Addendum)
[facility] DEPT Provider Note   CSN: [id number] Arrival date & time: 12/17/18  2322    History   Chief Complaint Chief Complaint  Patient presents with  . Shortness of Breath    HPI [name] is a 78 y.o. female.     HPI  This is a 78 year old female with a history of asthma, hypertension, interstitial lung disease, mitral valve pleural laps who presents with shortness of breath.  Patient reports worsening shortness of breath throughout the day.  Acutely worsened about 30 minutes ago.  EMS arrived and her O2 sat was 90%.  She does not wear supplemental oxygen.  She was placed on a nonrebreather.  She was given 125 Solu-Medrol and 2 mg of magnesium.  She did take a DuoNeb at home and states that she got some relief from that.  She denies any recent coughs or fevers.  No recent chest pain.  Denies any lower extremity swelling.  She recently was on a prednisone taper and is still taking 4 mg daily.  She reports similar hospitalization in September.  She is full code.  Past Medical History:  Diagnosis Date  . Abnormal finding of blood chemistry   . Asthma   . H/O measles   . H/O varicella   . Hypertension   . 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[place])   . 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 Lichen sclerosus 83/66/29   Asymptomatic  . Low iron   . Mitral valve prolapse   . Osteoarthritis   . Osteoporosis   . Pneumonia   . Post herpetic neuralgia   . Rheumatoid arthritis(714.0)   . Yeast infection     Patient Active Problem List   Diagnosis Date Noted  . Acute bronchitis 08/30/2018  . Chronic respiratory failure with hypoxia ([place]) 04/06/2018  . Acute respiratory failure with hypoxia ([place]) 03/26/2018  . Essential hypertension 03/24/2018  . Depression 03/24/2018  . Asthma exacerbation 03/23/2018  . DOE (dyspnea on exertion) 03/21/2018  . 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 esophagitis 03/21/2018  . Chronic lung disease   . Hypoxia   . Dyspnea 01/23/2018  .  Interstitial lung disease due to connective tissue disease ([name]) 01/23/2018  . 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 Multiple pulmonary nodules/RA lung dz  05/29/2013  . Cough variant asthma vs uacs  05/29/2013  . Arthritis, rheumatoid ([name]) 01/13/2012  . Osteopenia 01/13/2012  . Lichen sclerosus et atrophicus of the vulva 01/13/2012  . Asthma 01/13/2012    Past Surgical History:  Procedure Laterality Date  . CHOLECYSTECTOMY  2011  . WISDOM TOOTH EXTRACTION       OB History    Gravida  2   Para  2   Term  2   Preterm      AB      Living  2     SAB      TAB      Ectopic      Multiple      Live Births  2            Home Medications    Prior to Admission medications   Medication Sig Start Date End Date Taking? Authorizing Provider  albuterol (PROAIR HFA) 108 (90 Base) MCG/ACT inhaler Inhale 2 puffs into the lungs every 4 (four) hours as needed for wheezing or shortness of breath. 04/06/18   Parrett, Tammy S, NP  albuterol (PROVENTIL) (2.5 MG/3ML) 0.083% nebulizer solution Inhale 3 mLs (2.5  mg total) into the lungs every 4 (four) hours as needed for wheezing or shortness of breath. 12/07/18   Lauraine Rinne, NP  amitriptyline (ELAVIL) 25 MG tablet Take 25 mg by mouth at bedtime. 02/04/15   [provider]  beta carotene w/minerals (OCUVITE) tablet Take 1 tablet by mouth daily.    [provider]  cholecalciferol (VITAMIN D) 1000 UNITS tablet Take 1,000 Units by mouth daily.    [provider]  dextromethorphan (DELSYM) 30 MG/5ML liquid Take 2.5 mLs (15 mg total) by mouth at bedtime. Patient taking differently: Take 15 mg by mouth as needed.  01/27/18   Lavina Hamman, MD  doxycycline (VIBRA-TABS) 100 MG tablet Take 1 tablet (100 mg total) by mouth 2 (two) times daily. 11/07/18   Martyn Ehrich, NP  doxycycline (VIBRA-TABS) 100 MG tablet Take 1 tablet (100 mg total) by mouth 2 (two) times daily.  12/09/18   Lauraine Rinne, NP  gabapentin (NEURONTIN) 300 MG capsule Take 600 mg by mouth at bedtime.    [provider]  levalbuterol Penne Lash HFA) 45 MCG/ACT inhaler Inhale 2 puffs into the lungs every 4 (four) hours as needed for wheezing. 08/30/18 08/30/19  Lauraine Rinne, NP  levalbuterol Penne Lash) 0.63 MG/3ML nebulizer solution Inhale 0.63 mg into the lungs 2 (two) times daily as needed for wheezing or shortness of breath.  01/27/18   [provider]  losartan (COZAAR) 25 MG tablet Take 25 mg by mouth daily. 02/04/15   [provider]  mometasone-formoterol (DULERA) 100-5 MCG/ACT AERO Inhale 2 puffs into the lungs 2 (two) times daily. 11/07/18   Brand Males, MD  pantoprazole (PROTONIX) 40 MG tablet Take 1 tablet (40 mg total) by mouth 2 (two) times daily before a meal. 07/18/18   Nandigam, Venia Minks, MD  polyethylene glycol (MIRALAX / GLYCOLAX) packet Take 17 g by mouth daily. Patient taking differently: Take 17 g by mouth daily as needed (For constipation.).  01/27/18   Lavina Hamman, MD  predniSONE (DELTASONE) 10 MG tablet Take 4 tabs po daily x 2 days; then 3 tabs for 2 days; then 2 tabs for 2 days; then 1 tab for 2 days 11/07/18   Martyn Ehrich, NP  predniSONE (DELTASONE) 10 MG tablet Take 2 tablets daily with food for 5 days. 12/09/18   Lauraine Rinne, NP  riTUXimab (RITUXAN) 100 MG/10ML injection Inject into the vein. Receives [facility] but unsure of dose - no more doses for next 6 months as of 09/14/18    [provider]  venlafaxine XR (EFFEXOR-XR) 75 MG 24 hr capsule Take 75 mg by mouth at bedtime.     [provider]    Family History Family History  Problem Relation Age of Onset  . Asthma Mother   . Anemia Mother   . Polymyalgia rheumatica Mother   . COPD Father   . Pulmonary fibrosis Father   . Breast cancer Maternal Grandmother 88    Social History Social History   Tobacco Use  . Smoking status: Never Smoker  . Smokeless  tobacco: Never Used  Substance Use Topics  . Alcohol use: No  . Drug use: No     Allergies   Penicillins; Remicade [infliximab]; Sulfa antibiotics; and Sulfamethoxazole   Review of Systems Review of Systems  Constitutional: Negative for fever.  Respiratory: Positive for shortness of breath and wheezing. Negative for cough.   Cardiovascular: Negative for chest pain and leg swelling.  Gastrointestinal:  Negative for abdominal pain, nausea and vomiting.  Genitourinary: Negative for dysuria.  Musculoskeletal: Negative for back pain.  Skin: Negative for wound.  All other systems reviewed and are negative.    Physical Exam Updated Vital Signs BP 134/79   Pulse 90   Temp (!) 97.5 F (36.4 C) (Oral)   Resp (!) 23   Ht 1.676 m (5\' 6" )   Wt 63.5 kg   SpO2 93%   BMI 22.60 kg/m   Physical Exam Vitals signs and nursing note reviewed.  Constitutional:      Appearance: She is well-developed. She is ill-appearing.     Comments: Increased work of breathing, appears in respiratory distress  HENT:     Head: Normocephalic and atraumatic.  Eyes:     Pupils: Pupils are equal, round, and reactive to light.  Neck:     Musculoskeletal: Neck supple.  Cardiovascular:     Rate and Rhythm: Normal rate and regular rhythm.     Heart sounds: Normal heart sounds.  Pulmonary:     Effort: Tachypnea, accessory muscle usage and respiratory distress present.     Breath sounds: Wheezing present.     Comments: Wheezing in all lung fields, speaking in short sentences Abdominal:     General: Bowel sounds are normal.     Palpations: Abdomen is soft.  Musculoskeletal:     Right lower leg: No edema.     Left lower leg: No edema.  Skin:    General: Skin is warm and dry.  Neurological:     Mental Status: She is alert and oriented to person, place, and time.  Psychiatric:        Mood and Affect: Mood normal.      ED Treatments / Results  Labs (all labs ordered are listed, but only abnormal  results are displayed) Labs Reviewed  CBC WITH DIFFERENTIAL/PLATELET - Abnormal; Notable for the following components:      Result Value   WBC 14.8 (*)    Neutro Abs 8.0 (*)    Lymphs Abs 4.1 (*)    Monocytes Absolute 1.7 (*)    Eosinophils Absolute 0.8 (*)    Abs Immature Granulocytes 0.09 (*)    All other components within normal limits  BASIC METABOLIC PANEL - Abnormal; Notable for the following components:   Glucose, Bld 107 (*)    All other components within normal limits  BLOOD GAS, ARTERIAL - Abnormal; Notable for the following components:   pO2, Arterial 78.0 (*)    Bicarbonate 29.3 (*)    Acid-Base Excess 4.6 (*)    All other components within normal limits  TROPONIN I - Abnormal; Notable for the following components:   Troponin I 0.12 (*)    All other components within normal limits  I-STAT TROPONIN, ED - Abnormal; Notable for the following components:   Troponin i, poc 0.11 (*)    All other components within normal limits  SARS CORONAVIRUS 2 (HOSPITAL ORDER, [place] [facility])    EKG EKG Interpretation  Date/Time:  Sunday December 18 2018 00:05:23 EDT Ventricular Rate:  87 PR Interval:    QRS Duration: 92 QT Interval:  378 QTC Calculation: 450 R Axis:   42 Text Interpretation:  Sinus rhythm Probable RV involvement, suggest recording right precordial leads Poor data quality , repeat requested Confirmed by Thayer Jew ([id number]) on 12/18/2018 12:26:49 AM   EKG Interpretation  Date/Time:  Sunday December 18 2018 00:22:15 EDT Ventricular Rate:  88 PR Interval:  QRS Duration: 91 QT Interval:  388 QTC Calculation: 470 R Axis:   39 Text Interpretation:  Sinus rhythm Ventricular premature complex ST elevation, consider inferior injury No STEMI Confirmed by Thayer Jew [id number]) on 12/18/2018 12:27:37 AM        Radiology Dg Chest Portable 1 View  Result Date: 12/18/2018 CLINICAL DATA:  78 year old female with shortness of breath. EXAM:  PORTABLE CHEST 1 VIEW COMPARISON:  Chest CT dated 03/24/2018 FINDINGS: There is background of emphysema. No focal consolidation, pleural effusion, or pneumothorax. The cardiac silhouette is within normal limits. A linear density over the left upper lobe and left apical region extending to the region of the left hilum noted which may be superimposed on the patient. Dedicated PA and lateral views recommended for further evaluation. IMPRESSION: 1. No acute cardiopulmonary process. 2. Linear radiopaque density over the left upper lung field. Dedicated PA and lateral views of the chest recommended for better evaluation. Electronically Signed   By: Anner Crete M.D.   On: 12/18/2018 00:20    Procedures Procedures (including critical care time)  CRITICAL CARE Performed by: Merryl Hacker   Total critical care time: 41 minutes  Critical care time was exclusive of separately billable procedures and treating other patients.  Critical care was necessary to treat or prevent imminent or life-threatening deterioration.  Critical care was time spent personally by me on the following activities: development of treatment plan with patient and/or surrogate as well as nursing, discussions with consultants, evaluation of patient's response to treatment, examination of patient, obtaining history from patient or surrogate, ordering and performing treatments and interventions, ordering and review of laboratory studies, ordering and review of radiographic studies, pulse oximetry and re-evaluation of patient's condition.   Medications Ordered in ED Medications  albuterol (VENTOLIN HFA) 108 (90 Base) MCG/ACT inhaler 8 puff (8 puffs Inhalation Given 12/18/18 0000)  cefTRIAXone (ROCEPHIN) 1 g in sodium chloride 0.9 % 100 mL IVPB (1 g Intravenous New Bag/Given 12/18/18 0131)  azithromycin (ZITHROMAX) 500 mg in sodium chloride 0.9 % 250 mL IVPB (500 mg Intravenous New Bag/Given 12/18/18 0138)     Initial Impression /  Assessment and Plan / ED Course  I have reviewed the triage vital signs and the nursing notes.  Pertinent labs & imaging results that were available during my care of the patient were reviewed by me and considered in my medical decision making (see chart for details).  Clinical Course as of Dec 17 149  Sun Dec 18, 2018  0023 Patient feeling somewhat better after albuterol.  She continues to have increased work of breathing however and continues to speak in short sentences.  Repeat exam with better air movement but persistent wheezing.  No active chest pain.   [CH]  0026 EKG 12-Lead [CH]  0150 On recheck, patient states that she feels somewhat better.  She is now on nasal cannula.  She continues to wheeze and have increased work of breathing.  I feel she would benefit greatly from BiPAP.  Her COVID testing is negative.  She does have a small area on her x-ray questionable for infiltrate.  She was given Rocephin and azithromycin.  Reports recent course of doxycycline.  She also has a slightly elevated troponin.  No history of coronary artery disease.  No active chest pain.  No STEMI on EKG.  ? Demand.  Will admit.   [CH]    Clinical Course User Index [CH] Timmya Blazier, Barbette Hair, MD       Patient  presents with shortness of breath.  History of asthma and interstitial lung disease.  She appears in mild respiratory distress with increased work of breathing.  She is wheezing in all lung fields.  She denies any infectious symptoms.  Recently finished a course of prednisone and doxycycline.  Considerations include asthma exacerbation, pneumonia, COVID.  Work-up initiated.  She was given albuterol 8 puffs x 2 with some improvement.  However, she had persistent increased work of breathing with increased respiratory rate.  After COVID testing returned negative, she was placed on BiPAP for work of breathing.  ABG did show hypoxia.  Chest x-ray shows possible infiltrate.  Patient was given Rocephin and  azithromycin.  She is nontoxic-appearing and does not appear septic.  Additionally, her troponin is mildly elevated at 0.12.  Her EKG does not show any evidence of a STEMI and she does not have any chest pain and has not had any chest pain.  This will need to be trended.  Doubt ACS at this time.  Will discuss with admitting hospitalist.  Final Clinical Impressions(s) / ED Diagnoses   Final diagnoses:  Acute respiratory failure with hypoxia (HCC)  Elevated troponin  Severe asthma with exacerbation, unspecified whether persistent    ED Discharge Orders    None       Monette Omara, Barbette Hair, MD 12/18/18 3419    Merryl Hacker, MD 12/18/18 [phone]

## 2018-12-18 NOTE — H&P (Signed)
History and Physical    Stacey [name] SLH:[id number] DOB: [birth year] DOA: 12/17/2018  PCP: Shon Baton, MD Patient coming from: Home  Chief Complaint: Shortness of breath  HPI: Stacey [name] is a 78 y.o. female with medical history significant of asthma and interstitial lung disease followed by outpatient pulmonology, seropositive erosive rheumatoid arthritis, hypertension, mitral valve prolapse, and conditions listed below presenting to the hospital for evaluation of shortness of breath.  Per EMS, SPO2 90% on room air.  She was placed on a nonrebreather and went up to 100%.  She was given 125 mg of Solu-Medrol and 2 mg of IV magnesium.  Patient states she has asthma and interstitial lung disease.  Recently her breathing deteriorated and her pulmonologist's office prescribed a seven-day course of doxycycline and a 5-day course of prednisone.  She finished these medications a few days ago.  For the past 1 day she has been having increasing shortness of breath and wheezing.  She has not been coughing as much.  Around 7 PM last night she took a nebulizer treatment at home which seemed to help.  States she normally uses 2 L home oxygen PRN but has been needing it a lot more for the past few days.  Denies any fevers, chills, chest pain, nausea, vomiting, abdominal pain, or diarrhea.  Denies history of CAD or prior MI.  States she is now feeling better after receiving medications in the ED.  ED Course: Afebrile.  Tachypneic.  Wheezing and had increased work of breathing.  RT attempted BiPAP but patient was not able to tolerate it.  ABG with pH 7.42, PCO2 46, and PO2 78.  She was placed back on 2 L supplemental oxygen and tolerated it well.  White count 14.8.  COVID-19 rapid test negative.  Troponin 0.11 >0.12.  EKG not suggestive of ACS.  Portable chest x-ray showing a linear radiopaque density over the left upper lung field, no focal consolidation.  Patient received albuterol, ceftriaxone, and  azithromycin.  Review of Systems:  All systems reviewed and apart from history of presenting illness, are negative.  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 She has never used smokeless tobacco. She reports that she does not drink alcohol or use drugs.  Allergies  Allergen Reactions   Penicillins Other (See Comments)    Reaction unknown occurred during childhood Has patient had a PCN reaction causing immediate rash, facial/tongue/throat swelling, SOB or lightheadedness with hypotension: Unknown Has patient had a PCN reaction causing severe rash involving mucus membranes or skin necrosis: Unknown Has patient had a PCN reaction that required hospitalization: No Has patient had a PCN reaction occurring within the last 10 years: No If all of the above answers are "NO", then may proceed with Cephalosporin use.  Unsur   Remicade [Infliximab] Other (See Comments)  Reaction unknown   Sulfa Antibiotics Other (See Comments)    Reaction unknown   Sulfamethoxazole Other (See Comments)    Unsure of reaction As a child not sure     Family History  Problem Relation Age of Onset   Asthma Mother    Anemia Mother    Polymyalgia rheumatica Mother    COPD Father    Pulmonary fibrosis Father    Breast cancer Maternal Grandmother 88    Prior to Admission medications   Medication Sig Start Date End Date Taking? Authorizing Provider  albuterol  (PROAIR HFA) 108 (90 Base) MCG/ACT inhaler Inhale 2 puffs into the lungs every 4 (four) hours as needed for wheezing or shortness of breath. 04/06/18   Parrett, Fonnie Mu, NP  albuterol (PROVENTIL) (2.5 MG/3ML) 0.083% nebulizer solution Inhale 3 mLs (2.5 mg total) into the lungs every 4 (four) hours as needed for wheezing or shortness of breath. 12/07/18   Lauraine Rinne, NP  amitriptyline (ELAVIL) 25 MG tablet Take 25 mg by mouth at bedtime. 02/04/15   [provider]  beta carotene w/minerals (OCUVITE) tablet Take 1 tablet by mouth daily.    [provider]  cholecalciferol (VITAMIN D) 1000 UNITS tablet Take 1,000 Units by mouth daily.    [provider]  dextromethorphan (DELSYM) 30 MG/5ML liquid Take 2.5 mLs (15 mg total) by mouth at bedtime. Patient taking differently: Take 15 mg by mouth as needed.  01/27/18   Lavina Hamman, MD  doxycycline (VIBRA-TABS) 100 MG tablet Take 1 tablet (100 mg total) by mouth 2 (two) times daily. 11/07/18   Martyn Ehrich, NP  doxycycline (VIBRA-TABS) 100 MG tablet Take 1 tablet (100 mg total) by mouth 2 (two) times daily. 12/09/18   Lauraine Rinne, NP  gabapentin (NEURONTIN) 300 MG capsule Take 600 mg by mouth at bedtime.    [provider]  levalbuterol Penne Lash HFA) 45 MCG/ACT inhaler Inhale 2 puffs into the lungs every 4 (four) hours as needed for wheezing. 08/30/18 08/30/19  Lauraine Rinne, NP  levalbuterol Penne Lash) 0.63 MG/3ML nebulizer solution Inhale 0.63 mg into the lungs 2 (two) times daily as needed for wheezing or shortness of breath.  01/27/18   [provider]  losartan (COZAAR) 25 MG tablet Take 25 mg by mouth daily. 02/04/15   [provider]  mometasone-formoterol (DULERA) 100-5 MCG/ACT AERO Inhale 2 puffs into the lungs 2 (two) times daily. 11/07/18   Brand Males, MD  pantoprazole (PROTONIX) 40 MG tablet Take 1 tablet (40 mg total) by mouth 2 (two) times daily before a meal. 07/18/18   Nandigam,  Venia Minks, MD  polyethylene glycol (MIRALAX / GLYCOLAX) packet Take 17 g by mouth daily. Patient taking differently: Take 17 g by mouth daily as needed (For constipation.).  01/27/18   Lavina Hamman, MD  predniSONE (DELTASONE) 10 MG tablet Take 4 tabs po daily x 2 days; then 3 tabs for 2 days; then 2 tabs for 2 days; then 1 tab for 2 days 11/07/18   Martyn Ehrich, NP  predniSONE (DELTASONE) 10 MG tablet Take 2 tablets daily with food for 5 days. 12/09/18   Lauraine Rinne, NP  riTUXimab (RITUXAN) 100 MG/10ML injection Inject into the vein. Receives at [facility] but unsure of dose - no more doses for next 6 months as of 09/14/18    [provider]  venlafaxine XR (EFFEXOR-XR) 75 MG 24 hr capsule Take 75 mg by mouth at bedtime.  [provider]    Physical Exam: Vitals:   12/18/18 0115 12/18/18 0130 12/18/18 0145 12/18/18 0200  BP: 132/70 134/79 140/63 123/69  Pulse: 81 90 80 92  Resp: (!) 23 (!) 23 17 15   Temp:      TempSrc:      SpO2: 91% 93% 92% 95%  Weight:      Height:        Physical Exam  Constitutional: She is oriented to person, place, and time.  HENT:  Head: Normocephalic.  Mouth/Throat: Oropharynx is clear and moist.  Eyes: Right eye exhibits no discharge. Left eye exhibits no discharge.  Neck: Neck supple.  Cardiovascular: Normal rate, regular rhythm and intact distal pulses.  Pulmonary/Chest: She has no rales.  Slightly increased work of breathing Satting well on 4 L supplemental oxygen via nasal cannula Diffuse end expiratory wheezing  Abdominal: Soft. Bowel sounds are normal. She exhibits no distension. There is no abdominal tenderness. There is no guarding.  Musculoskeletal:        General: No edema.  Neurological: She is alert and oriented to person, place, and time.  Skin: Skin is warm and dry. She is not diaphoretic.     Labs on Admission: I have personally reviewed following labs and imaging studies  CBC: Recent Labs  Lab 12/17/18 2354    WBC 14.8*  NEUTROABS 8.0*  HGB 13.9  HCT 44.1  MCV 95.0  PLT 932   Basic Metabolic Panel: Recent Labs  Lab 12/17/18 2354  NA 139  K 4.2  CL 101  CO2 29  GLUCOSE 107*  BUN 13  CREATININE 0.79  CALCIUM 8.9   GFR: Estimated Creatinine Clearance: 54.3 mL/min (by C-G formula based on SCr of 0.79 mg/dL). Liver Function Tests: No results for input(s): AST, ALT, ALKPHOS, BILITOT, PROT, ALBUMIN in the last 168 hours. No results for input(s): LIPASE, AMYLASE in the last 168 hours. No results for input(s): AMMONIA in the last 168 hours. Coagulation Profile: No results for input(s): INR, PROTIME in the last 168 hours. Cardiac Enzymes: Recent Labs  Lab 12/18/18 0020  TROPONINI 0.12*   BNP (last 3 results) Recent Labs    03/21/18 1119  PROBNP 38.0   HbA1C: No results for input(s): HGBA1C in the last 72 hours. CBG: No results for input(s): GLUCAP in the last 168 hours. Lipid Profile: No results for input(s): CHOL, HDL, LDLCALC, TRIG, CHOLHDL, LDLDIRECT in the last 72 hours. Thyroid Function Tests: No results for input(s): TSH, T4TOTAL, FREET4, T3FREE, THYROIDAB in the last 72 hours. Anemia Panel: No results for input(s): VITAMINB12, FOLATE, FERRITIN, TIBC, IRON, RETICCTPCT in the last 72 hours. Urine analysis:    Component Value Date/Time   COLORURINE YELLOW 12/18/2009 0120   APPEARANCEUR CLEAR 12/18/2009 0120   LABSPEC 1.006 12/18/2009 0120   PHURINE 7.0 12/18/2009 0120   GLUCOSEU NEGATIVE 12/18/2009 0120   HGBUR NEGATIVE 12/18/2009 0120   BILIRUBINUR NEGATIVE 12/18/2009 0120   KETONESUR NEGATIVE 12/18/2009 0120   PROTEINUR NEGATIVE 12/18/2009 0120   UROBILINOGEN 0.2 12/18/2009 0120   NITRITE NEGATIVE 12/18/2009 0120   LEUKOCYTESUR TRACE (A) 12/18/2009 0120    Radiological Exams on Admission: Dg Chest Portable 1 View  Result Date: 12/18/2018 CLINICAL DATA:  78 year old female with shortness of breath. EXAM: PORTABLE CHEST 1 VIEW COMPARISON:  Chest CT dated  03/24/2018 FINDINGS: There is background of emphysema. No focal consolidation, pleural effusion, or pneumothorax. The cardiac silhouette is within normal limits. A linear density over the left upper lobe and  left apical region extending to the region of the left hilum noted which may be superimposed on the patient. Dedicated PA and lateral views recommended for further evaluation. IMPRESSION: 1. No acute cardiopulmonary process. 2. Linear radiopaque density over the left upper lung field. Dedicated PA and lateral views of the chest recommended for better evaluation. Electronically Signed   By: Anner Crete M.D.   On: 12/18/2018 00:20    EKG: Independently reviewed.  Sinus rhythm, repolarization abnormality, PVC.  No STEMI.  Assessment/Plan Principal Problem:   Acute asthma exacerbation Active Problems:   Rheumatoid arthritis ([place])   ILD (interstitial lung disease) ([place])   Essential hypertension   Elevated troponin   Acute hypoxic respiratory failure secondary to acute exacerbation of asthma and interstitial lung disease SPO2 90% on room air per EMS report.  Tachypneic, wheezing, and had increased work of breathing on arrival.  Currently continues to have slightly increased work of breathing and wheezing.  Satting well on 4 L supplemental oxygen.  Afebrile.  White count mildly elevated.  COVID-19 rapid test negative. Portable chest x-ray showing a linear radiopaque density over the left upper lung field, no focal consolidation.  Received Solu-Medrol 125 mg, IV magnesium 2 mg, albuterol treatment, ceftriaxone, and azithromycin. -Solu-Medrol 60 mg every 8 hours -Duo nebs every 6 hours -Albuterol nebulizer as needed -Continuous pulse ox -Check procalcitonin level.  If elevated, continue antibiotics. -Repeat two-view chest x-ray -Continue to monitor CBC -Supplemental oxygen  Elevated troponin Suspect secondary to demand ischemia from acute pulmonary process.  No documented history of CAD or  prior cath results in the chart.  Troponin 0.11 >0.12.  EKG not suggestive of ACS.  Patient is not complaining of any chest pain. -Cardiac monitoring -Trend troponin -Serial EKGs  Seropositive erosive rheumatoid arthritis Followed by rheumatology at [facility] and being treated with Rituxan. -Outpatient follow-up  Hypertension -Currently normotensive.  Hydralazine PRN.  Unable to safely order home medications at this time as pharmacy medication reconciliation is pending.  DVT prophylaxis: Lovenox Code Status: Patient wishes to be full code. Family Communication: No family available. Disposition Plan: Anticipate discharge in 1 to 2 days. Consults called: None Admission status: It is my clinical opinion that referral for OBSERVATION is reasonable and necessary in this patient based on the above information provided. The aforementioned taken together are felt to place the patient at high risk for further clinical deterioration. However it is anticipated that the patient may be medically stable for discharge from the hospital within 24 to 48 hours.  The medical decision making on this patient was of high complexity and the patient is at high risk for clinical deterioration, therefore this is a level 3 visit.  Shela Leff MD [facility] Pager [phone]  If 7PM-7AM, please contact night-coverage [email or web address] Password [email or web address]  12/18/2018, 2:36 AM

## 2018-12-18 NOTE — Progress Notes (Signed)
  Echocardiogram 2D Echocardiogram has been performed.  Stacey [name] 12/18/2018, 3:06 PM

## 2018-12-18 NOTE — Progress Notes (Addendum)
CRITICAL VALUE ALERT  Critical Value: Trop 0.66  Date & Time Notied:  12-18-2018 @ 0500  Provider Notified: Kennon Holter, NP  Orders Received/Actions taken: EKG completed as ordered and WNL

## 2018-12-18 NOTE — ED Notes (Signed)
ED TO INPATIENT HANDOFF REPORT  ED Nurse Name and Phone #: Lavella Lemons RN [phone]  S Name/Age/Gender Stacey [name] 78 y.o. female Room/Bed: WA19/WA19  Code Status   Code Status: Full Code  Home/SNF/Other Home Patient oriented to: self, place, time and situation Is this baseline? Yes   Triage Complete: Triage complete  Chief Complaint SOB  Triage Note Pt came to the ED with hx of interstitial lung dx.  Pt has been sob most of the day at home.  EMS upon arrival had a room air 02 sat of 90%.  Pt was placed on NRB and went up to 100%.  Pt was given 125mg  of solumedrol, and 2MG  of Mag IV.  Pt took duoneb at home around 1900 with little relief.    Allergies Allergies  Allergen Reactions  . Penicillins Other (See Comments)    Reaction unknown occurred during childhood Has patient had a PCN reaction causing immediate rash, facial/tongue/throat swelling, SOB or lightheadedness with hypotension: Unknown Has patient had a PCN reaction causing severe rash involving mucus membranes or skin necrosis: Unknown Has patient had a PCN reaction that required hospitalization: No Has patient had a PCN reaction occurring within the last 10 years: No If all of the above answers are "NO", then may proceed with Cephalosporin use.  Unsur  . Remicade [Infliximab] Other (See Comments)    Reaction unknown  . Sulfa Antibiotics Other (See Comments)    Reaction unknown  . Sulfamethoxazole Other (See Comments)    Unsure of reaction As a child not sure     Level of Care/Admitting Diagnosis ED Disposition    ED Disposition Condition [place]: [place] [100102]  Level of Care: Telemetry [5]  Admit to tele based on following criteria: Other see comments  Comments: Monitor hemodynamics  Covid Evaluation: Confirmed COVID Negative  Diagnosis: Acute asthma exacerbation [938101]  Admitting Physician: Shela Leff [[id number]]  Attending Physician: Shela Leff [[id number]]  PT Class (Do Not Modify): Observation [104]  PT Acc Code (Do Not Modify): Observation [10022]       B Medical/Surgery History Past Medical History:  Diagnosis Date  . Abnormal finding of blood chemistry   . Asthma   . H/O measles   . H/O varicella   . Hypertension   . 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[place])   . 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 Lichen sclerosus 23/53/61   Asymptomatic  . Low iron   . Mitral valve prolapse   . Osteoarthritis   . Osteoporosis   . Pneumonia   . Post herpetic neuralgia   . Rheumatoid arthritis(714.0)   . Yeast infection    Past Surgical History:  Procedure Laterality Date  . CHOLECYSTECTOMY  2011  . WISDOM TOOTH EXTRACTION       A IV Location/Drains/Wounds Patient Lines/Drains/Airways Status   Active Line/Drains/Airways    Name:   Placement date:   Placement time:   Site:   Days:   Peripheral IV 12/17/18 Left Forearm   12/17/18    2344    Forearm   1   Peripheral IV 12/18/18 Right Antecubital   12/18/18    0045    Antecubital   less than 1          Intake/Output Last 24 hours  Intake/Output Summary (Last 24 hours) at 12/18/2018 0301 Last data filed at 12/18/2018 0242 Gross per 24 hour  Intake 350.84 ml  Output -  Net 350.84 ml  Labs/Imaging Results for orders placed or performed during the hospital encounter of 12/17/18 (from the past 48 hour(s))  SARS Coronavirus 2 (CEPHEID- Performed in [facility] hospital lab), Hosp Order     Status: None   Collection Time: 12/17/18 11:42 PM  Result Value Ref Range   SARS Coronavirus 2 NEGATIVE NEGATIVE    Comment: (NOTE) If result is NEGATIVE SARS-CoV-2 target nucleic acids are NOT DETECTED. The SARS-CoV-2 RNA is generally detectable in upper and lower  respiratory specimens during the acute phase of infection. The lowest  concentration of SARS-CoV-2 viral copies this assay can detect is 250  copies / mL. A negative result does not preclude SARS-CoV-2 infection  and  should not be used as the sole basis for treatment or other  patient management decisions.  A negative result may occur with  improper specimen collection / handling, submission of specimen other  than nasopharyngeal swab, presence of viral mutation(s) within the  areas targeted by this assay, and inadequate number of viral copies  (<250 copies / mL). A negative result must be combined with clinical  observations, patient history, and epidemiological information. If result is POSITIVE SARS-CoV-2 target nucleic acids are DETECTED. The SARS-CoV-2 RNA is generally detectable in upper and lower  respiratory specimens dur ing the acute phase of infection.  Positive  results are indicative of active infection with SARS-CoV-2.  Clinical  correlation with patient history and other diagnostic information is  necessary to determine patient infection status.  Positive results do  not rule out bacterial infection or co-infection with other viruses. If result is PRESUMPTIVE POSTIVE SARS-CoV-2 nucleic acids MAY BE PRESENT.   A presumptive positive result was obtained on the submitted specimen  and confirmed on repeat testing.  While 2019 novel coronavirus  (SARS-CoV-2) nucleic acids may be present in the submitted sample  additional confirmatory testing may be necessary for epidemiological  and / or clinical management purposes  to differentiate between  SARS-CoV-2 and other Sarbecovirus currently known to infect humans.  If clinically indicated additional testing with an alternate test  methodology [phone]) is advised. The SARS-CoV-2 RNA is generally  detectable in upper and lower respiratory sp ecimens during the acute  phase of infection. The expected result is Negative. Fact Sheet for Patients:  StrictlyIdeas.no Fact Sheet for Healthcare Providers: BankingDealers.co.za This test is not yet approved or cleared by the [place] FDA and has been  authorized for detection and/or diagnosis of SARS-CoV-2 by FDA under an Emergency Use Authorization (EUA).  This EUA will remain in effect (meaning this test can be used) for the duration of the COVID-19 declaration under Section 564(b)(1) of the Act, 21 U.S.C. section 360bbb-3(b)(1), unless the authorization is terminated or revoked sooner. Performed at [facility], [place] [address], [place], [place] [address]   CBC with Differential     Status: Abnormal   Collection Time: 12/17/18 11:54 PM  Result Value Ref Range   WBC 14.8 (H) 4.0 - 10.5 K/uL   RBC 4.64 3.87 - 5.11 MIL/uL   Hemoglobin 13.9 12.0 - 15.0 g/dL   HCT 44.1 36.0 - 46.0 %   MCV 95.0 80.0 - 100.0 fL   MCH 30.0 26.0 - 34.0 pg   MCHC 31.5 30.0 - 36.0 g/dL   RDW 13.6 11.5 - 15.5 %   Platelets 349 150 - 400 K/uL   nRBC 0.0 0.0 - 0.2 %   Neutrophils Relative % 53 %   Neutro Abs 8.0 (H) 1.7 -  7.7 K/uL   Lymphocytes Relative 28 %   Lymphs Abs 4.1 (H) 0.7 - 4.0 K/uL   Monocytes Relative 11 %   Monocytes Absolute 1.7 (H) 0.1 - 1.0 K/uL   Eosinophils Relative 6 %   Eosinophils Absolute 0.8 (H) 0.0 - 0.5 K/uL   Basophils Relative 1 %   Basophils Absolute 0.1 0.0 - 0.1 K/uL   Immature Granulocytes 1 %   Abs Immature Granulocytes 0.09 (H) 0.00 - 0.07 K/uL    Comment: Performed at [facility], [place] [address] [place], [place] [address]  Basic metabolic panel     Status: Abnormal   Collection Time: 12/17/18 11:54 PM  Result Value Ref Range   Sodium 139 135 - 145 mmol/L   Potassium 4.2 3.5 - 5.1 mmol/L   Chloride 101 98 - 111 mmol/L   CO2 29 22 - 32 mmol/L   Glucose, Bld 107 (H) 70 - 99 mg/dL   BUN 13 8 - 23 mg/dL   Creatinine, Ser 0.79 0.44 - 1.00 mg/dL   Calcium 8.9 8.9 - 10.3 mg/dL   GFR calc non Af Amer >60 >60 mL/min   GFR calc Af Amer >60 >60 mL/min   Anion gap 9 5 - 15    Comment: Performed at [facility], [place] [address], [place], [place] [address]  I-Stat  Troponin, ED (not at [facility])     Status: Abnormal   Collection Time: 12/17/18 11:59 PM  Result Value Ref Range   Troponin i, poc 0.11 (HH) 0.00 - 0.08 ng/mL   Comment NOTIFIED PHYSICIAN    Comment 3            Comment: Due to the release kinetics of cTnI, a negative result within the first hours of the onset of symptoms does not rule out myocardial infarction with certainty. If myocardial infarction is still suspected, repeat the test at appropriate intervals.   Blood gas, arterial (at [facility])     Status: Abnormal   Collection Time: 12/18/18 12:10 AM  Result Value Ref Range   FIO2 100.00    Delivery systems  BLOWBY    pH, Arterial 7.420 7.350 - 7.450   pCO2 arterial 46.1 32.0 - 48.0 mmHg   pO2, Arterial 78.0 (L) 83.0 - 108.0 mmHg   Bicarbonate 29.3 (H) 20.0 - 28.0 mmol/L   Acid-Base Excess 4.6 (H) 0.0 - 2.0 mmol/L   O2 Saturation 95.4 %   Patient temperature 98.6    Collection site RIGHT RADIAL    Drawn by [id number]    Sample type ARTERIAL DRAW    Allens test (pass/fail) PASS PASS    Comment: Performed at [facility], [place] [address], [place], [place] [address]  Troponin I - ONCE - STAT     Status: Abnormal   Collection Time: 12/18/18 12:20 AM  Result Value Ref Range   Troponin I 0.12 (HH) <0.03 ng/mL    Comment: CRITICAL RESULT CALLED TO, READ BACK BY AND VERIFIED WITH: DOSTER,T RN @0056  ON 12/18/2018 JACKSON,K Performed at [facility], [place] [address], [place], [place] [address]    Dg Chest Portable 1 View  Result Date: 12/18/2018 CLINICAL DATA:  78 year old female with shortness of breath. EXAM: PORTABLE CHEST 1 VIEW COMPARISON:  Chest CT dated 03/24/2018 FINDINGS: There is background of emphysema. No focal consolidation, pleural effusion, or pneumothorax. The cardiac silhouette is within normal limits. A linear density over the left upper lobe and left apical  region extending to the region of the left hilum noted which may be superimposed on  the patient. Dedicated PA and lateral views recommended for further evaluation. IMPRESSION: 1. No acute cardiopulmonary process. 2. Linear radiopaque density over the left upper lung field. Dedicated PA and lateral views of the chest recommended for better evaluation. Electronically Signed   By: Anner Crete M.D.   On: 12/18/2018 00:20    Pending Labs Unresulted Labs (From admission, onward)    Start     Ordered   12/18/18 0500  CBC  Tomorrow morning,   R     12/18/18 0222   12/18/18 0220  Troponin I - Now Then Q3H  Now then every 3 hours,   R     12/18/18 0222   12/18/18 0220  Procalcitonin - Baseline  ONCE - STAT,   STAT     12/18/18 0222          Vitals/Pain Today's Vitals   12/18/18 0145 12/18/18 0200 12/18/18 0215 12/18/18 0230  BP: 140/63 123/69 126/67 125/65  Pulse: 80 92 87 80  Resp: 17 15 17  (!) 21  Temp:      TempSrc:      SpO2: 92% 95% 95% 96%  Weight:      Height:      PainSc:        Isolation Precautions No active isolations  Medications Medications  enoxaparin (LOVENOX) injection 40 mg (has no administration in time range)  acetaminophen (TYLENOL) tablet 650 mg (has no administration in time range)    Or  acetaminophen (TYLENOL) suppository 650 mg (has no administration in time range)  ipratropium-albuterol (DUONEB) 0.5-2.5 (3) MG/3ML nebulizer solution 3 mL (has no administration in time range)  methylPREDNISolone sodium succinate (SOLU-MEDROL) 125 mg/2 mL injection 60 mg (has no administration in time range)  albuterol (PROVENTIL) (2.5 MG/3ML) 0.083% nebulizer solution 2.5 mg (has no administration in time range)  hydrALAZINE (APRESOLINE) injection 5 mg (has no administration in time range)  cefTRIAXone (ROCEPHIN) 1 g in sodium chloride 0.9 % 100 mL IVPB (0 g Intravenous Stopped 12/18/18 0203)  azithromycin (ZITHROMAX) 500 mg in sodium chloride 0.9 % 250 mL IVPB (0 mg Intravenous Stopped 12/18/18 0242)    Mobility walks Low fall risk   Focused  Assessments Pulmonary Assessment Handoff:  Lung sounds: Bilateral Breath Sounds: Diminished L Breath Sounds: Diminished R Breath Sounds: Diminished O2 Device: Nasal Cannula O2 Flow Rate (L/min): 2 L/min      R Recommendations: See Admitting Provider Note  Report given to:   Additional Notes: Interstitial lung disease

## 2018-12-19 ENCOUNTER — Encounter (HOSPITAL_COMMUNITY): Payer: Self-pay | Admitting: Cardiology

## 2018-12-19 DIAGNOSIS — J849 Interstitial pulmonary disease, unspecified: Secondary | ICD-10-CM | POA: Diagnosis not present

## 2018-12-19 DIAGNOSIS — E7800 Pure hypercholesterolemia, unspecified: Secondary | ICD-10-CM | POA: Diagnosis not present

## 2018-12-19 DIAGNOSIS — Z20828 Contact with and (suspected) exposure to other viral communicable diseases: Secondary | ICD-10-CM | POA: Diagnosis not present

## 2018-12-19 DIAGNOSIS — I21A1 Myocardial infarction type 2: Secondary | ICD-10-CM | POA: Diagnosis not present

## 2018-12-19 DIAGNOSIS — J309 Allergic rhinitis, unspecified: Secondary | ICD-10-CM | POA: Diagnosis present

## 2018-12-19 DIAGNOSIS — I2510 Atherosclerotic heart disease of native coronary artery without angina pectoris: Secondary | ICD-10-CM

## 2018-12-19 DIAGNOSIS — M8580 Other specified disorders of bone density and structure, unspecified site: Secondary | ICD-10-CM | POA: Diagnosis present

## 2018-12-19 DIAGNOSIS — M059 Rheumatoid arthritis with rheumatoid factor, unspecified: Secondary | ICD-10-CM | POA: Diagnosis not present

## 2018-12-19 DIAGNOSIS — J4541 Moderate persistent asthma with (acute) exacerbation: Secondary | ICD-10-CM | POA: Diagnosis not present

## 2018-12-19 DIAGNOSIS — Z882 Allergy status to sulfonamides status: Secondary | ICD-10-CM | POA: Diagnosis not present

## 2018-12-19 DIAGNOSIS — M810 Age-related osteoporosis without current pathological fracture: Secondary | ICD-10-CM | POA: Diagnosis not present

## 2018-12-19 DIAGNOSIS — B0229 Other postherpetic nervous system involvement: Secondary | ICD-10-CM | POA: Diagnosis not present

## 2018-12-19 DIAGNOSIS — Z825 Family history of asthma and other chronic lower respiratory diseases: Secondary | ICD-10-CM | POA: Diagnosis not present

## 2018-12-19 DIAGNOSIS — Z880 Allergy status to penicillin: Secondary | ICD-10-CM | POA: Diagnosis not present

## 2018-12-19 DIAGNOSIS — Z9189 Other specified personal risk factors, not elsewhere classified: Secondary | ICD-10-CM | POA: Diagnosis not present

## 2018-12-19 DIAGNOSIS — J45901 Unspecified asthma with (acute) exacerbation: Secondary | ICD-10-CM | POA: Diagnosis not present

## 2018-12-19 DIAGNOSIS — Z888 Allergy status to other drugs, medicaments and biological substances status: Secondary | ICD-10-CM | POA: Diagnosis not present

## 2018-12-19 DIAGNOSIS — M069 Rheumatoid arthritis, unspecified: Secondary | ICD-10-CM | POA: Diagnosis not present

## 2018-12-19 DIAGNOSIS — Z9049 Acquired absence of other specified parts of digestive tract: Secondary | ICD-10-CM | POA: Diagnosis not present

## 2018-12-19 DIAGNOSIS — R7989 Other specified abnormal findings of blood chemistry: Secondary | ICD-10-CM

## 2018-12-19 DIAGNOSIS — I080 Rheumatic disorders of both mitral and aortic valves: Secondary | ICD-10-CM | POA: Diagnosis not present

## 2018-12-19 DIAGNOSIS — I10 Essential (primary) hypertension: Secondary | ICD-10-CM

## 2018-12-19 DIAGNOSIS — K219 Gastro-esophageal reflux disease without esophagitis: Secondary | ICD-10-CM | POA: Diagnosis not present

## 2018-12-19 DIAGNOSIS — Z803 Family history of malignant neoplasm of breast: Secondary | ICD-10-CM | POA: Diagnosis not present

## 2018-12-19 DIAGNOSIS — Z7952 Long term (current) use of systemic steroids: Secondary | ICD-10-CM | POA: Diagnosis not present

## 2018-12-19 DIAGNOSIS — J9621 Acute and chronic respiratory failure with hypoxia: Secondary | ICD-10-CM | POA: Diagnosis not present

## 2018-12-19 DIAGNOSIS — J984 Other disorders of lung: Secondary | ICD-10-CM | POA: Diagnosis not present

## 2018-12-19 DIAGNOSIS — R0602 Shortness of breath: Secondary | ICD-10-CM | POA: Diagnosis not present

## 2018-12-19 DIAGNOSIS — Z79899 Other long term (current) drug therapy: Secondary | ICD-10-CM | POA: Diagnosis not present

## 2018-12-19 DIAGNOSIS — E785 Hyperlipidemia, unspecified: Secondary | ICD-10-CM | POA: Diagnosis not present

## 2018-12-19 LAB — TROPONIN I
Troponin I: 2.38 ng/mL (ref <0.03)
Troponin I: 2.42 ng/mL (ref <0.03)
Troponin I: 1.9 ng/mL (ref <0.03)

## 2018-12-19 LAB — LIPID PANEL
Cholesterol: 222 mg/dL — ABNORMAL HIGH (ref 0–200)
HDL: 59 mg/dL (ref >40)
LDL Cholesterol: 137 mg/dL — ABNORMAL HIGH (ref 0–99)
Total CHOL/HDL Ratio: 3.8 RATIO
Triglycerides: 129 mg/dL (ref <150)
VLDL: 26 mg/dL (ref 0–40)

## 2018-12-19 LAB — HEPARIN LEVEL (UNFRACTIONATED): Heparin Unfractionated: 0.59 IU/mL (ref 0.30–0.70)

## 2018-12-19 MED ORDER — PRAVASTATIN SODIUM 20 MG PO TABS
20.0000 mg | ORAL_TABLET | Freq: Every day | ORAL | Status: DC
  Administered 2018-12-19 – 2018-12-23 (×5): 20 mg via ORAL
  Filled 2018-12-19 (×5): qty 1

## 2018-12-19 MED ORDER — METHYLPREDNISOLONE SODIUM SUCC 40 MG IJ SOLR
40.0000 mg | Freq: Two times a day (BID) | INTRAMUSCULAR | Status: DC
  Administered 2018-12-19 – 2018-12-22 (×6): 40 mg via INTRAVENOUS
  Filled 2018-12-19 (×6): qty 1

## 2018-12-19 MED ORDER — GABAPENTIN 300 MG PO CAPS
600.0000 mg | ORAL_CAPSULE | Freq: Every day | ORAL | Status: DC
  Administered 2018-12-20 – 2018-12-23 (×5): 600 mg via ORAL
  Filled 2018-12-19 (×5): qty 2

## 2018-12-19 MED ORDER — VENLAFAXINE HCL ER 75 MG PO CP24
75.0000 mg | ORAL_CAPSULE | Freq: Every day | ORAL | Status: DC
  Administered 2018-12-20 – 2018-12-23 (×5): 75 mg via ORAL
  Filled 2018-12-19 (×5): qty 1

## 2018-12-19 MED ORDER — GUAIFENESIN-DM 100-10 MG/5ML PO SYRP
10.0000 mL | ORAL_SOLUTION | Freq: Four times a day (QID) | ORAL | Status: DC
  Administered 2018-12-19 – 2018-12-24 (×21): 10 mL via ORAL
  Filled 2018-12-19 (×21): qty 10

## 2018-12-19 MED ORDER — GUAIFENESIN ER 600 MG PO TB12: 600.0000 mg | ORAL_TABLET | Freq: Two times a day (BID) | ORAL | Status: DC | PRN

## 2018-12-19 MED ORDER — AMITRIPTYLINE HCL 25 MG PO TABS
25.0000 mg | ORAL_TABLET | Freq: Every day | ORAL | Status: DC
  Administered 2018-12-20 – 2018-12-23 (×5): 25 mg via ORAL
  Filled 2018-12-19 (×5): qty 1

## 2018-12-19 MED ORDER — HEPARIN (PORCINE) 25000 UT/250ML-% IV SOLN
800.0000 [IU]/h | INTRAVENOUS | Status: DC
  Administered 2018-12-19 – 2018-12-20 (×2): 800 [IU]/h via INTRAVENOUS
  Filled 2018-12-19 (×2): qty 250

## 2018-12-19 MED ORDER — HEPARIN (PORCINE) 25000 UT/250ML-% IV SOLN: 800.0000 [IU]/h | INTRAVENOUS | Status: DC

## 2018-12-19 MED ORDER — ASPIRIN EC 81 MG PO TBEC
81.0000 mg | DELAYED_RELEASE_TABLET | Freq: Every day | ORAL | Status: DC
  Administered 2018-12-19 – 2018-12-24 (×6): 81 mg via ORAL
  Filled 2018-12-19 (×6): qty 1

## 2018-12-19 NOTE — Consult Note (Signed)
Cardiology Consultation:   Patient ID: AVIRA TILLISON MRN: [id number]; DOB: 1940/12/16  Admit date: 12/17/2018 Date of Consult: 12/19/2018  Primary Care Provider: Shon Baton, MD Primary Cardiologist: No primary care provider on file. New has been seen at [facility] 12/2017 Dr. Elsie Stain but only once  Primary Electrophysiologist:  None    Patient Profile:   Stacey [name] is a 78 y.o. female with a hx of ILD, asthma, RA, HTN, at one point MVP not seen on last echo,  osteopenia who is being seen today for the evaluation of elevated troponin, and SOB at the request of Dr. Elbert Ewings.  History of Present Illness:   Ms. Nest with hx of RA and ILD followed at [facility] by rheumatology. Was seen by cardiology last year for DOE at [facility] and nuc stress test ordered but not done due to resp. issues, tests at [facility] : Normal Stress Echocardiogram 2009,  Echo 11/2017 with normal LV function, normal diastolic function, trivial MR and trivial TR  In 2009 mild post MV leaflet prolapse.     Now admitted 12/18/18 after presenting with SOB and her RA 02 sats were 90%.  She was placed on NRB and IV solumedrol and IV mg+ given.  In ER attempted BiPap but she did not tolerate.   Prior to this acute episode she had been placed on doxycycline and steroids for increased dyspnea.  She has home oxygen that she uses PRN.   No fevers, chills, chest pain.   Started with cough.  On old CT angio of chest she was noted to have coronary atherosclerosis. Including calcified atherosclerotic plaque in the left main, left anterior descending and right coronary arteries. Calcifications of the aortic valve and mitral annulus. Aberrant right subclavian artery (normal anatomical variant) incidentally noted.  EKG:  The EKG was personally reviewed and demonstrates:  Most are poor quality, SR there does appear to be ST elevation very mild inf lat leads.   Telemetry:  Telemetry was personally reviewed and demonstrates:  SR with PVCs occ and PACs  short 3-4 beat bursts rarely.  Troponin 0.11, 0.12;0.66; 0.78; 1.06; 1.14; 2.10; 2.38  Na 139, K+ 4.2 Cr 0.79 WBC 14.8 (after steroids) today 11.2 Hgb 12.3 plts 345 COVID neg Echo yesterday with EF 60-65%, impaired LV relaxation.  No RWMA, mild TR, no changes from 2019.  Currently BP 113/59 afebrile and HR of 66.  On 2L sp02 of 95%  Feeling better.  No chest pain.      Past Medical History:  Diagnosis Date  . Abnormal finding of blood chemistry   . Asthma   . H/O measles   . H/O varicella   . Hypertension   . 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[place])   . 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 Lichen sclerosus 24/26/83   Asymptomatic  . Low iron   . Mitral valve prolapse   . Osteoarthritis   . Osteoporosis   . Pneumonia   . Post herpetic neuralgia   . Rheumatoid arthritis(714.0)   . Yeast infection     Past Surgical History:  Procedure Laterality Date  . CHOLECYSTECTOMY  2011  . WISDOM TOOTH EXTRACTION       Home Medications:  Prior to Admission medications   Medication Sig Start Date End Date Taking? Authorizing Provider  albuterol (PROAIR HFA) 108 (90 Base) MCG/ACT inhaler Inhale 2 puffs into the lungs every 4 (four) hours as needed for wheezing or shortness of breath. 04/06/18  Yes Parrett, Fonnie Mu, NP  albuterol (PROVENTIL) (2.5 MG/3ML) 0.083% nebulizer solution Inhale 3 mLs (2.5 mg total) into the lungs every 4 (four) hours as needed for wheezing or shortness of breath. 12/07/18  Yes Lauraine Rinne, NP  amitriptyline (ELAVIL) 25 MG tablet Take 25 mg by mouth at bedtime. 02/04/15  Yes [provider]  beta carotene w/minerals (OCUVITE) tablet Take 1 tablet by mouth daily.   Yes [provider]  cholecalciferol (VITAMIN D) 1000 UNITS tablet Take 1,000 Units by mouth daily.   Yes [provider]  dextromethorphan (DELSYM) 30 MG/5ML liquid Take 2.5 mLs (15 mg total) by mouth at bedtime. Patient taking differently: Take 15 mg by mouth as needed for cough.   01/27/18  Yes Lavina Hamman, MD  gabapentin (NEURONTIN) 300 MG capsule Take 600 mg by mouth at bedtime.   Yes [provider]  losartan (COZAAR) 25 MG tablet Take 25 mg by mouth daily. 02/04/15  Yes [provider]  methylPREDNISolone (MEDROL) 4 MG tablet Take 2-4 mg by mouth daily. Take 2 mg by mouth every other day alternating with 4 mg on the other days   Yes [provider]  mometasone-formoterol (DULERA) 100-5 MCG/ACT AERO Inhale 2 puffs into the lungs 2 (two) times daily. 11/07/18  Yes Brand Males, MD  pantoprazole (PROTONIX) 40 MG tablet Take 1 tablet (40 mg total) by mouth 2 (two) times daily before a meal. Patient taking differently: Take 40 mg by mouth daily.  07/18/18  Yes Nandigam, Venia Minks, MD  polyethylene glycol (MIRALAX / GLYCOLAX) packet Take 17 g by mouth daily. Patient taking differently: Take 17 g by mouth daily as needed (For constipation.).  01/27/18  Yes Lavina Hamman, MD  riTUXimab (RITUXAN) 100 MG/10ML injection Inject into the vein. Receives at [facility] but unsure of dose - no more doses for next 6 months as of 09/14/18   Yes [provider]  venlafaxine XR (EFFEXOR-XR) 75 MG 24 hr capsule Take 75 mg by mouth at bedtime.    Yes [provider]  doxycycline (VIBRA-TABS) 100 MG tablet Take 1 tablet (100 mg total) by mouth 2 (two) times daily. Patient not taking: Reported on 12/18/2018 11/07/18   Martyn Ehrich, NP  doxycycline (VIBRA-TABS) 100 MG tablet Take 1 tablet (100 mg total) by mouth 2 (two) times daily. Patient not taking: Reported on 12/18/2018 12/09/18   Lauraine Rinne, NP  levalbuterol [facility] HFA) 45 MCG/ACT inhaler Inhale 2 puffs into the lungs every 4 (four) hours as needed for wheezing. Patient not taking: Reported on 12/18/2018 08/30/18 08/30/19  Lauraine Rinne, NP  predniSONE (DELTASONE) 10 MG tablet Take 4 tabs po daily x 2 days; then 3 tabs for 2 days; then 2 tabs for 2 days; then 1 tab for 2 days Patient not taking:  Reported on 12/18/2018 11/07/18   Martyn Ehrich, NP  predniSONE (DELTASONE) 10 MG tablet Take 2 tablets daily with food for 5 days. Patient not taking: Reported on 12/18/2018 12/09/18   Lauraine Rinne, NP    Inpatient Medications: Scheduled Meds: . enoxaparin (LOVENOX) injection  40 mg Subcutaneous Q24H  . ipratropium-albuterol  3 mL Nebulization Q6H  . methylPREDNISolone (SOLU-MEDROL) injection  60 mg Intravenous Q8H   Continuous Infusions:  PRN Meds: acetaminophen **OR** acetaminophen, albuterol, benzonatate, guaiFENesin, hydrALAZINE  Allergies:    Allergies  Allergen Reactions  . Penicillins Other (See Comments)    Reaction unknown occurred during childhood Has patient had a PCN reaction causing immediate rash,  facial/tongue/throat swelling, SOB or lightheadedness with hypotension: Unknown Has patient had a PCN reaction causing severe rash involving mucus membranes or skin necrosis: Unknown Has patient had a PCN reaction that required hospitalization: No Has patient had a PCN reaction occurring within the last 10 years: No If all of the above answers are "NO", then may proceed with Cephalosporin use.  Unsur  . Remicade [Infliximab] Other (See Comments)    Reaction unknown  . Sulfa Antibiotics Other (See Comments)    Reaction unknown  . Sulfamethoxazole Other (See Comments)    Unsure of reaction As a child not sure     Social History:   Social History   Socioeconomic History  . Marital status: Married    Spouse name: Not on file  . Number of children: 2  . Years of education: BA  . Highest education level: Not on file  Occupational History  . Occupation: Retired  Scientific laboratory technician  . Financial resource strain: Not on file  . Food insecurity:    Worry: Not on file    Inability: Not on file  . Transportation needs:    Medical: Not on file    Non-medical: Not on file  Tobacco Use  . Smoking status: Never Smoker  . Smokeless tobacco: Never Used  Substance and Sexual  Activity  . Alcohol use: No  . Drug use: No  . Sexual activity: Yes  Lifestyle  . Physical activity:    Days per week: Not on file    Minutes per session: Not on file  . Stress: Not on file  Relationships  . Social connections:    Talks on phone: Not on file    Gets together: Not on file    Attends religious service: Not on file    Active member of club or organization: Not on file    Attends meetings of clubs or organizations: Not on file    Relationship status: Not on file  . Intimate partner violence:    Fear of current or ex partner: Not on file    Emotionally abused: Not on file    Physically abused: Not on file    Forced sexual activity: Not on file  Other Topics Concern  . Not on file  Social History Narrative   Drinks 1 cup of coffee and 1 diet coke a day     Family History:    Family History  Problem Relation Age of Onset  . Asthma Mother   . Anemia Mother   . Polymyalgia rheumatica Mother   . COPD Father   . Pulmonary fibrosis Father   . Breast cancer Maternal Grandmother 88     ROS:  Please see the history of present illness.  General:no colds or fevers, no weight changes Skin:no rashes or ulcers HEENT:no blurred vision, no congestion CV:see HPI PUL:see HPI GI:no diarrhea constipation or melena, no indigestion GU:no hematuria, no dysuria MS:no joint pain, no claudication, + RA Neuro:no syncope, no lightheadedness Endo:no diabetes, no thyroid disease  All other ROS reviewed and negative.     Physical Exam/Data:   Vitals:   12/18/18 2028 12/18/18 2223 12/19/18 0151 12/19/18 0648  BP:  130/66  (!) 113/59  Pulse:  70  66  Resp:  20  18  Temp:  98.7 F (37.1 C)  97.8 F (36.6 C)  TempSrc:  Oral  Oral  SpO2: 97% 94% 95% 100%  Weight:      Height:        Intake/Output  Summary (Last 24 hours) at 12/19/2018 0845 Last data filed at 12/19/2018 0739 Gross per 24 hour  Intake -  Output 1300 ml  Net -1300 ml   Last 3 Weights 12/18/2018 12/18/2018  09/14/2018  Weight (lbs) 141 lb 1.5 oz 140 lb 143 lb  Weight (kg) 64 kg 63.504 kg 64.864 kg     Body mass index is 22.77 kg/m.  General:  Well nourished, well developed, in no acute distress, still with cough HEENT: normal Lymph: no adenopathy Neck: no JVD Endocrine:  No thryomegaly Vascular: No carotid bruits; pedal pulses 2+ bilaterally Cardiac:  normal S1, S2; RRR; no murmur gallup rub or click Lungs:  + wheezes and rhonchi to auscultation bilaterally, no rales Abd: soft, nontender, no hepatomegaly  Ext: no edema Musculoskeletal:  No deformities, BUE and BLE strength normal and equal Skin: warm and dry  Neuro:  Alert and oriented X 3, MAE follows commands  no focal abnormalities noted Psych:  Normal affect   Relevant CV Studies: Echo 12/18/18 IMPRESSIONS    1. The left ventricle has normal systolic function with an ejection fraction of 60-65%. The cavity size was normal. There is mildly increased left ventricular wall thickness. Left ventricular diastolic Doppler parameters are consistent with impaired  relaxation. No evidence of left ventricular regional wall motion abnormalities.  2. The right ventricle has normal systolic function. The cavity was normal. There is no increase in right ventricular wall thickness. Right ventricular systolic pressure normal with an estimated pressure of 27.8 mmHg.  3. The mitral valve is grossly normal. There is mild mitral annular calcification present.  4. The tricuspid valve is grossly normal. There is mild tricuspid regurgitation.  5. The aortic valve is tricuspid with moderate calcification and thickening of the noncoronary cusp.  6. The aortic root is normal in size and structure.  FINDINGS  Left Ventricle: The left ventricle has normal systolic function, with an ejection fraction of 60-65%. The cavity size was normal. There is mildly increased left ventricular wall thickness. Left ventricular diastolic Doppler parameters are consistent   with impaired relaxation. No evidence of left ventricular regional wall motion abnormalities..  Right Ventricle: The right ventricle has normal systolic function. The cavity was normal. There is no increase in right ventricular wall thickness. Right ventricular systolic pressure normal with an estimated pressure of 34.8 mmHg.  Left Atrium: Left atrial size was normal in size.  Right Atrium: Right atrial size was normal in size. Right atrial pressure is estimated at 10 mmHg.  Interatrial Septum: No atrial level shunt detected by color flow Doppler.  Pericardium: There is no evidence of pericardial effusion.  Mitral Valve: The mitral valve is grossly normal. There is mild mitral annular calcification present. Mitral valve regurgitation is trivial by color flow Doppler.  Tricuspid Valve: The tricuspid valve is grossly normal. Tricuspid valve regurgitation is mild by color flow Doppler.  Aortic Valve: The aortic valve is tricuspid with moderate calcification and thickening of the noncoronary cusp. of the aortic valve. Aortic valve regurgitation was not visualized by color flow Doppler.  Pulmonic Valve: The pulmonic valve was grossly normal. Pulmonic valve regurgitation is not visualized by color flow Doppler.  Aorta: The aortic root is normal in size and structure.  Venous: The inferior vena cava measures 2.02 cm, is normal in size with greater than 50% respiratory variability.  Echo 01/24/18 Study Conclusions  - Left ventricle: The cavity size was normal. Wall thickness was   normal. Systolic function was normal. The estimated  ejection   fraction was in the range of 55% to 60%. Wall motion was normal;   there were no regional wall motion abnormalities. Doppler   parameters are consistent with abnormal left ventricular   relaxation (grade 1 diastolic dysfunction). - Aortic valve: Valve mobility was mildly restricted.  Impressions:  - Normal LV systolic function; mild  diastolic dysfunction;   calcified aortic valve with reduced excursion of noncoronary cusp   but no AS by doppler.  Laboratory Data:  Chemistry Recent Labs  Lab 12/17/18 2354  NA 139  K 4.2  CL 101  CO2 29  GLUCOSE 107*  BUN 13  CREATININE 0.79  CALCIUM 8.9  GFRNONAA >60  GFRAA >60  ANIONGAP 9    No results for input(s): PROT, ALBUMIN, AST, ALT, ALKPHOS, BILITOT in the last 168 hours. Hematology Recent Labs  Lab 12/17/18 2354 12/18/18 0501  WBC 14.8* 11.2*  RBC 4.64 4.23  HGB 13.9 12.3  HCT 44.1 40.6  MCV 95.0 96.0  MCH 30.0 29.1  MCHC 31.5 30.3  RDW 13.6 13.7  PLT 349 345   Cardiac Enzymes Recent Labs  Lab 12/18/18 0242 12/18/18 0501 12/18/18 0941 12/18/18 1545 12/18/18 2206 12/19/18 0341  TROPONINI 0.66* 0.78* 1.06* 1.14* 2.10* 2.38*    Recent Labs  Lab 12/17/18 2359  TROPIPOC 0.11*    BNPNo results for input(s): BNP, PROBNP in the last 168 hours.  DDimer No results for input(s): DDIMER in the last 168 hours.  Radiology/Studies:  Dg Chest Portable 1 View  Result Date: 12/18/2018 CLINICAL DATA:  78 year old female with shortness of breath. EXAM: PORTABLE CHEST 1 VIEW COMPARISON:  Chest CT dated 03/24/2018 FINDINGS: There is background of emphysema. No focal consolidation, pleural effusion, or pneumothorax. The cardiac silhouette is within normal limits. A linear density over the left upper lobe and left apical region extending to the region of the left hilum noted which may be superimposed on the patient. Dedicated PA and lateral views recommended for further evaluation. IMPRESSION: 1. No acute cardiopulmonary process. 2. Linear radiopaque density over the left upper lung field. Dedicated PA and lateral views of the chest recommended for better evaluation. Electronically Signed   By: Anner Crete M.D.   On: 12/18/2018 00:20    Assessment and Plan:   1. Acute hypoxic respiratory failure secondary to asthma and ILD.  Per IM 2. Elevated troponins and  abnormal EKG NSTEMI inf. Lat.  This may be demand ischemia due to pulmonary status,  No chest pain but known CAD on CTA of chest .  Was to have nuc last year but ended up in hospital with respiratory issues.   Would treat pulmonary status and then cardiac CTA vs stress test,  No RWMA on echo. not on IV heparin.  Dr.Christopher to see  3. RA followed at [facility] 4. HTN on cozaar 25 mg daily at home, BP stable off meds. Except for prn hydralazine.       For questions or updates, please contact [place] Please consult [email or web address] for contact info under     Signed, Cecilie Kicks, NP  12/19/2018 8:45 AM

## 2018-12-19 NOTE — Progress Notes (Signed)
Pharmacy called regarding the heparin level, no changes has been made with the rate; ordered to continue heparin drip at 800units/hr. Pt has no signs of bleeding nor distress at this moment. Will continue to monitor pt.

## 2018-12-19 NOTE — Progress Notes (Signed)
[place] for Heparin Indication: chest pain/ACS  Allergies  Allergen Reactions  . Penicillins Other (See Comments)    Reaction unknown occurred during childhood Has patient had a PCN reaction causing immediate rash, facial/tongue/throat swelling, SOB or lightheadedness with hypotension: Unknown Has patient had a PCN reaction causing severe rash involving mucus membranes or skin necrosis: Unknown Has patient had a PCN reaction that required hospitalization: No Has patient had a PCN reaction occurring within the last 10 years: No If all of the above answers are "NO", then may proceed with Cephalosporin use.  Unsur  . Remicade [Infliximab] Other (See Comments)    Reaction unknown  . Sulfa Antibiotics Other (See Comments)    Reaction unknown  . Sulfamethoxazole Other (See Comments)    Unsure of reaction As a child not sure    Patient Measurements: Height: 5\' 6"  (167.6 cm) Weight: 141 lb 1.5 oz (64 kg) IBW/kg (Calculated) : 59.3 Heparin Dosing Weight: 64 kg  Vital Signs: Temp: 98.4 F (36.9 C) (06/08 2212) Temp Source: Oral (06/08 2212) BP: 143/72 (06/08 2212) Pulse Rate: 78 (06/08 2212)  Labs: Recent Labs    12/17/18 2354  12/18/18 0501  12/18/18 2206 12/19/18 0341 12/19/18 1616 12/19/18 2204  HGB 13.9  --  12.3  --   --   --   --   --   HCT 44.1  --  40.6  --   --   --   --   --   PLT 349  --  345  --   --   --   --   --   HEPARINUNFRC  --   --   --   --   --   --   --  0.59  CREATININE 0.79  --   --   --   --   --   --   --   TROPONINI  --    < > 0.78*   < > 2.10* 2.38* 2.42*  --    < > = values in this interval not displayed.   Estimated Creatinine Clearance: 54.3 mL/min (by C-G formula based on SCr of 0.79 mg/dL).  Assessment: Patient is a 78 y.o F with a hx of asthma and interstitial lung disease presented to the ED on 6/7 with c/o SOB. Pt's troponin continues to increase.  To start heparin drop for r/o ACS.  Today,  12/19/2018: Troponins 0.78 >> 2.42 Patient received Lovenox 40mg  this am 0936 Begin Heparin infusion at 800 units/hr, no load 1st hep level at 2200 = 0.59 units/ml, in desired range No s/s bleed per RN, Heparin running at 8 ml/hr, no problems noted  Goal of Therapy:  Heparin level 0.3-0.7 units/ml Monitor platelets by anticoagulation protocol: Yes   Plan:   Continue Heparin at 800 units/hr  Next heparin level with am labs  Daily heparin level , daily CBC  Minda Ditto PharmD Pager [phone] 12/19/2018, 11:00 PM

## 2018-12-19 NOTE — Progress Notes (Signed)
[place] for heparin Indication: chest pain/ACS  Allergies  Allergen Reactions  . Penicillins Other (See Comments)    Reaction unknown occurred during childhood Has patient had a PCN reaction causing immediate rash, facial/tongue/throat swelling, SOB or lightheadedness with hypotension: Unknown Has patient had a PCN reaction causing severe rash involving mucus membranes or skin necrosis: Unknown Has patient had a PCN reaction that required hospitalization: No Has patient had a PCN reaction occurring within the last 10 years: No If all of the above answers are "NO", then may proceed with Cephalosporin use.  Unsur  . Remicade [Infliximab] Other (See Comments)    Reaction unknown  . Sulfa Antibiotics Other (See Comments)    Reaction unknown  . Sulfamethoxazole Other (See Comments)    Unsure of reaction As a child not sure     Patient Measurements: Height: 5\' 6"  (167.6 cm) Weight: 141 lb 1.5 oz (64 kg) IBW/kg (Calculated) : 59.3 Heparin Dosing Weight: 64 kg  Vital Signs: Temp: 97.8 F (36.6 C) (06/08 0648) Temp Source: Oral (06/08 0648) BP: 113/59 (06/08 0648) Pulse Rate: 66 (06/08 0648)  Labs: Recent Labs    12/17/18 2354  12/18/18 0501  12/18/18 1545 12/18/18 2206 12/19/18 0341  HGB 13.9  --  12.3  --   --   --   --   HCT 44.1  --  40.6  --   --   --   --   PLT 349  --  345  --   --   --   --   CREATININE 0.79  --   --   --   --   --   --   TROPONINI  --    < > 0.78*   < > 1.14* 2.10* 2.38*   < > = values in this interval not displayed.    Estimated Creatinine Clearance: 54.3 mL/min (by C-G formula based on SCr of 0.79 mg/dL).   Assessment: Patient is a 78 y.o F wit hx asthma and interstitial lung disease presented to t he ED on 6/7 with c/o SOB. Pt's troponin continues to increase.  To start heparin drop for r/o ACS.  Today, 12/19/2018: - CBC ok - no bleeding doc   Goal of Therapy:  Heparin level 0.3-0.7  units/ml Monitor platelets by anticoagulation protocol: Yes   Plan:  -  Patient received lovenox 40 mg SQ dose at 0936 this morning, will start heparin drip at 800 units/hr at 2pm today (no bolus) - check 8 hr heparin level - monitor for s/s bleeding  Kelby Lotspeich P 12/19/2018,9:52 AM

## 2018-12-19 NOTE — Progress Notes (Signed)
PROGRESS NOTE    Stacey [name]  ION:[id number] DOB: Mar 24, 1941 DOA: 12/17/2018 PCP: Shon Baton, MD   Brief Narrative: Patient is a 78 year old female with history of asthma, interstitial lung disease being followed by outpatient pulmonology, rheumatoid arthritis on Rituxan, hypertension, mitral valve prolapse who presented the emergency room with complaints of shortness of breath.  Patient was admitted for the management of exacerbation.  She reports that she had 2-3 exacerbations in the last 6 months.  She is on 2 L of oxygen at home as needed. Patient started on IV steroids, bronchodilators.  Covid-19 test was negative. Her hospital course was remarkable for gradual improvement in troponin.  This morning her troponin is in the range of 2.  Cardiology consulted.  Denies any chest pain.  Started on heparin drip.    Assessment & Plan:   Principal Problem:   Acute asthma exacerbation Active Problems:   Rheumatoid arthritis ([place])   ILD (interstitial lung disease) ([place])   Essential hypertension   Elevated troponin  Acute hypoxic failure secondary to asthma exacerbation: History of asthma.  Started on bronchodilators.  Continue steroids.  Respiratory status is gradually improving.  She feels better than yesterday.  Chest x-ray on presentation did not show any focal consolidation.  She follows with pulmonology, Dr. Chase Caller.  Elevated troponin: Denies any chest pain.  Troponin continues to rise.  EKG did not show any ischemic changes.  Echocardiogram showed normal left ventricular function, normal ejection fraction, impaired relaxation of left ventricle, no wall motion abnormalities.  Cardiology following.  Started on heparin drip  Seropositive rheumatoid arthritis: Followed by rheumatology at [facility].  Being treated with Rituxan  Hypertension: Currently normotensive.  Continue PRN meds         DVT prophylaxis: IV heparin Code Status: Full Family Communication: None present at the  bedside Disposition Plan: Home after resolution of asthma symptoms, further work-up   Consultants: Cardiology  Procedures: None  Antimicrobials:  Anti-infectives (From admission, onward)   Start     Dose/Rate Route Frequency Ordered Stop   12/18/18 0130  cefTRIAXone (ROCEPHIN) 1 g in sodium chloride 0.9 % 100 mL IVPB     1 g 200 mL/hr over 30 Minutes Intravenous  Once 12/18/18 0119 12/18/18 0203   12/18/18 0130  azithromycin (ZITHROMAX) 500 mg in sodium chloride 0.9 % 250 mL IVPB     500 mg 250 mL/hr over 60 Minutes Intravenous  Once 12/18/18 0119 12/18/18 0242      Subjective: Patient seen and examined the bedside this morning.  Respiratory status has improved.  Feels better today.  Less wheezes on auscultation.  Still bothered by cough.  Denies any chest pain  Objective: Vitals:   12/18/18 2223 12/19/18 0151 12/19/18 0648 12/19/18 0823  BP: 130/66  (!) 113/59   Pulse: 70  66   Resp: 20  18   Temp: 98.7 F (37.1 C)  97.8 F (36.6 C)   TempSrc: Oral  Oral   SpO2: 94% 95% 100% 95%  Weight:      Height:        Intake/Output Summary (Last 24 hours) at 12/19/2018 1334 Last data filed at 12/19/2018 0830 Gross per 24 hour  Intake 240 ml  Output 1300 ml  Net -1060 ml   Filed Weights   12/18/18 0022 12/18/18 0318  Weight: 63.5 kg 64 kg    Examination:  General exam: Not in distress,thin  built HEENT:PERRL,Oral mucosa moist, Ear/Nose normal on gross exam Respiratory system: Bilateral decreased air  entry, mild expiratory wheezes Cardiovascular system: S1 & S2 heard, RRR. No JVD, murmurs, rubs, gallops or clicks. No pedal edema. Gastrointestinal system: Abdomen is nondistended, soft and nontender. No organomegaly or masses felt. Normal bowel sounds heard. Central nervous system: Alert and oriented. No focal neurological deficits. Extremities: No edema, no clubbing ,no cyanosis, distal peripheral pulses palpable.  Mild deformities of the hand joints Skin: No rashes, lesions  or ulcers,no icterus ,no pallor MSK: Normal muscle bulk,tone ,power Psychiatry: Judgement and insight appear normal. Mood & affect appropriate.     Data Reviewed: I have personally reviewed following labs and imaging studies  CBC: Recent Labs  Lab 12/17/18 2354 12/18/18 0501  WBC 14.8* 11.2*  NEUTROABS 8.0*  --   HGB 13.9 12.3  HCT 44.1 40.6  MCV 95.0 96.0  PLT 349 001   Basic Metabolic Panel: Recent Labs  Lab 12/17/18 2354  NA 139  K 4.2  CL 101  CO2 29  GLUCOSE 107*  BUN 13  CREATININE 0.79  CALCIUM 8.9   GFR: Estimated Creatinine Clearance: 54.3 mL/min (by C-G formula based on SCr of 0.79 mg/dL). Liver Function Tests: No results for input(s): AST, ALT, ALKPHOS, BILITOT, PROT, ALBUMIN in the last 168 hours. No results for input(s): LIPASE, AMYLASE in the last 168 hours. No results for input(s): AMMONIA in the last 168 hours. Coagulation Profile: No results for input(s): INR, PROTIME in the last 168 hours. Cardiac Enzymes: Recent Labs  Lab 12/18/18 0501 12/18/18 0941 12/18/18 1545 12/18/18 2206 12/19/18 0341  TROPONINI 0.78* 1.06* 1.14* 2.10* 2.38*   BNP (last 3 results) Recent Labs    03/21/18 1119  PROBNP 38.0   HbA1C: No results for input(s): HGBA1C in the last 72 hours. CBG: No results for input(s): GLUCAP in the last 168 hours. Lipid Profile: No results for input(s): CHOL, HDL, LDLCALC, TRIG, CHOLHDL, LDLDIRECT in the last 72 hours. Thyroid Function Tests: No results for input(s): TSH, T4TOTAL, FREET4, T3FREE, THYROIDAB in the last 72 hours. Anemia Panel: No results for input(s): VITAMINB12, FOLATE, FERRITIN, TIBC, IRON, RETICCTPCT in the last 72 hours. Sepsis Labs: Recent Labs  Lab 12/18/18 0242  PROCALCITON <0.10    Recent Results (from the past 240 hour(s))  SARS Coronavirus 2 (CEPHEID- Performed in [facility] hospital lab), Hosp Order     Status: None   Collection Time: 12/17/18 11:42 PM  Result Value Ref Range Status   SARS  Coronavirus 2 NEGATIVE NEGATIVE Final    Comment: (NOTE) If result is NEGATIVE SARS-CoV-2 target nucleic acids are NOT DETECTED. The SARS-CoV-2 RNA is generally detectable in upper and lower  respiratory specimens during the acute phase of infection. The lowest  concentration of SARS-CoV-2 viral copies this assay can detect is 250  copies / mL. A negative result does not preclude SARS-CoV-2 infection  and should not be used as the sole basis for treatment or other  patient management decisions.  A negative result may occur with  improper specimen collection / handling, submission of specimen other  than nasopharyngeal swab, presence of viral mutation(s) within the  areas targeted by this assay, and inadequate number of viral copies  (<250 copies / mL). A negative result must be combined with clinical  observations, patient history, and epidemiological information. If result is POSITIVE SARS-CoV-2 target nucleic acids are DETECTED. The SARS-CoV-2 RNA is generally detectable in upper and lower  respiratory specimens dur ing the acute phase of infection.  Positive  results are indicative of active infection with  SARS-CoV-2.  Clinical  correlation with patient history and other diagnostic information is  necessary to determine patient infection status.  Positive results do  not rule out bacterial infection or co-infection with other viruses. If result is PRESUMPTIVE POSTIVE SARS-CoV-2 nucleic acids MAY BE PRESENT.   A presumptive positive result was obtained on the submitted specimen  and confirmed on repeat testing.  While 2019 novel coronavirus  (SARS-CoV-2) nucleic acids may be present in the submitted sample  additional confirmatory testing may be necessary for epidemiological  and / or clinical management purposes  to differentiate between  SARS-CoV-2 and other Sarbecovirus currently known to infect humans.  If clinically indicated additional testing with an alternate test   methodology [phone]) is advised. The SARS-CoV-2 RNA is generally  detectable in upper and lower respiratory sp ecimens during the acute  phase of infection. The expected result is Negative. Fact Sheet for Patients:  StrictlyIdeas.no Fact Sheet for Healthcare Providers: BankingDealers.co.za This test is not yet approved or cleared by the [place] FDA and has been authorized for detection and/or diagnosis of SARS-CoV-2 by FDA under an Emergency Use Authorization (EUA).  This EUA will remain in effect (meaning this test can be used) for the duration of the COVID-19 declaration under Section 564(b)(1) of the Act, 21 U.S.C. section 360bbb-3(b)(1), unless the authorization is terminated or revoked sooner. Performed at [facility], [place] [address], [place], [place] [address]          Radiology Studies: Dg Chest Portable 1 View  Result Date: 12/18/2018 CLINICAL DATA:  78 year old female with shortness of breath. EXAM: PORTABLE CHEST 1 VIEW COMPARISON:  Chest CT dated 03/24/2018 FINDINGS: There is background of emphysema. No focal consolidation, pleural effusion, or pneumothorax. The cardiac silhouette is within normal limits. A linear density over the left upper lobe and left apical region extending to the region of the left hilum noted which may be superimposed on the patient. Dedicated PA and lateral views recommended for further evaluation. IMPRESSION: 1. No acute cardiopulmonary process. 2. Linear radiopaque density over the left upper lung field. Dedicated PA and lateral views of the chest recommended for better evaluation. Electronically Signed   By: Anner Crete M.D.   On: 12/18/2018 00:20        Scheduled Meds: . aspirin EC  81 mg Oral Daily  . guaiFENesin-dextromethorphan  10 mL Oral Q6H  . ipratropium-albuterol  3 mL Nebulization Q6H  . methylPREDNISolone (SOLU-MEDROL) injection  40 mg Intravenous Q12H    Continuous Infusions: . heparin       LOS: 0 days    Time spent: 25 mins.More than 50% of that time was spent in counseling and/or coordination of care.      Shelly Coss, MD [facility] Pager [phone]  If 7PM-7AM, please contact night-coverage [email or web address] Password [email or web address] 12/19/2018, 1:34 PM

## 2018-12-20 DIAGNOSIS — Z9189 Other specified personal risk factors, not elsewhere classified: Secondary | ICD-10-CM

## 2018-12-20 LAB — BASIC METABOLIC PANEL
Anion gap: 7 (ref 5–15)
BUN: 15 mg/dL (ref 8–23)
CO2: 26 mmol/L (ref 22–32)
Calcium: 8.4 mg/dL — ABNORMAL LOW (ref 8.9–10.3)
Chloride: 106 mmol/L (ref 98–111)
Creatinine, Ser: 0.65 mg/dL (ref 0.44–1.00)
GFR calc Af Amer: >60 mL/min (ref >60)
GFR calc non Af Amer: >60 mL/min (ref >60)
Glucose, Bld: 133 mg/dL — ABNORMAL HIGH (ref 70–99)
Potassium: 3.7 mmol/L (ref 3.5–5.1)
Sodium: 139 mmol/L (ref 135–145)

## 2018-12-20 LAB — CBC
HCT: 41.3 % (ref 36.0–46.0)
Hemoglobin: 13 g/dL (ref 12.0–15.0)
MCH: 29.9 pg (ref 26.0–34.0)
MCHC: 31.5 g/dL (ref 30.0–36.0)
MCV: 94.9 fL (ref 80.0–100.0)
Platelets: 321 10*3/uL (ref 150–400)
RBC: 4.35 MIL/uL (ref 3.87–5.11)
RDW: 13.9 % (ref 11.5–15.5)
WBC: 11.8 10*3/uL — ABNORMAL HIGH (ref 4.0–10.5)
nRBC: 0 % (ref 0.0–0.2)

## 2018-12-20 LAB — TROPONIN I: Troponin I: 1.17 ng/mL (ref <0.03)

## 2018-12-20 LAB — HEPARIN LEVEL (UNFRACTIONATED): Heparin Unfractionated: 0.64 IU/mL (ref 0.30–0.70)

## 2018-12-20 MED ORDER — LOSARTAN POTASSIUM 25 MG PO TABS
25.0000 mg | ORAL_TABLET | Freq: Every day | ORAL | Status: DC
  Administered 2018-12-20 – 2018-12-24 (×5): 25 mg via ORAL
  Filled 2018-12-20 (×5): qty 1

## 2018-12-20 MED ORDER — HYDRALAZINE HCL 10 MG PO TABS: 10.0000 mg | ORAL_TABLET | Freq: Four times a day (QID) | ORAL | Status: DC | PRN

## 2018-12-20 NOTE — Progress Notes (Signed)
PROGRESS NOTE    Stacey [name]  ZOX:[id number] DOB: 13-May-1941 DOA: 12/17/2018 PCP: Shon Baton, MD   Brief Narrative: Patient is a 78 year old female with history of asthma, interstitial lung disease being followed by outpatient pulmonology, rheumatoid arthritis on Rituxan, hypertension, mitral valve prolapse who presented the emergency room with complaints of shortness of breath.  Patient was admitted for the management of exacerbation.  She reports that she had 2-3 exacerbations in the last 6 months.  She is on 2 L of oxygen at home as needed. Patient started on IV steroids, bronchodilators.  Covid-19 test was negative. Her hospital course was remarkable for gradual increment  in troponin most likely because of type 2 MI. Cardiology consulted.  Denies any chest pain.  Started on heparin drip.    Assessment & Plan:   Principal Problem:   Acute asthma exacerbation Active Problems:   Rheumatoid arthritis ([place])   ILD (interstitial lung disease) ([place])   Essential hypertension   Elevated troponin  Acute hypoxic failure secondary to asthma exacerbation: History of asthma.  Started on bronchodilators.  Continue steroids.  Respiratory status is gradually improving.  She feels better than yesterday.  Chest x-ray on presentation did not show any focal consolidation.  She follows with pulmonology, Dr. Chase Caller.  Elevated troponin: Denies any chest pain.    EKG did not show any ischemic changes.  Echocardiogram showed normal left ventricular function, normal ejection fraction, impaired relaxation of left ventricle, no wall motion abnormalities.  This is most likely due to supply demand ischemia.  She has been started on heparin drip.  Troponin is trending down now.  Cardiology following.  Also started on aspirin.  Hyperlipidemia: LDL of 137.  Started on pravastatin  Seropositive rheumatoid arthritis: Followed by rheumatology at [facility].  Being treated with Rituxan  Hypertension: Currently  normotensive.  Continue PRN meds         DVT prophylaxis: IV heparin Code Status: Full Family Communication: None present at the bedside Disposition Plan: Home after resolution of asthma symptoms  Consultants: Cardiology  Procedures: None  Antimicrobials:  Anti-infectives (From admission, onward)   Start     Dose/Rate Route Frequency Ordered Stop   12/18/18 0130  cefTRIAXone (ROCEPHIN) 1 g in sodium chloride 0.9 % 100 mL IVPB     1 g 200 mL/hr over 30 Minutes Intravenous  Once 12/18/18 0119 12/18/18 0203   12/18/18 0130  azithromycin (ZITHROMAX) 500 mg in sodium chloride 0.9 % 250 mL IVPB     500 mg 250 mL/hr over 60 Minutes Intravenous  Once 12/18/18 0119 12/18/18 0242      Subjective: Patient seen and examined the bedside this morning.  Respiratory status is better today.  Still bothered by cough .No chest pain  Objective: Vitals:   12/19/18 2212 12/20/18 0148 12/20/18 0638 12/20/18 0911  BP: (!) 143/72  (!) 153/77   Pulse: 78  66   Resp: 20  16   Temp: 98.4 F (36.9 C)  98.3 F (36.8 C)   TempSrc: Oral  Oral   SpO2: 94% 93% 99% 95%  Weight:      Height:        Intake/Output Summary (Last 24 hours) at 12/20/2018 1226 Last data filed at 12/20/2018 1139 Gross per 24 hour  Intake 301.36 ml  Output 3000 ml  Net -2698.64 ml   Filed Weights   12/18/18 0022 12/18/18 0318  Weight: 63.5 kg 64 kg    Examination:  General exam: Not in distress,thin  built HEENT:PERRL,Oral mucosa moist, Ear/Nose normal on gross exam Respiratory system: Bilateral decreased air entry, mild expiratory wheezes Cardiovascular system: S1 & S2 heard, RRR. No JVD, murmurs, rubs, gallops or clicks. No pedal edema. Gastrointestinal system: Abdomen is nondistended, soft and nontender. No organomegaly or masses felt. Normal bowel sounds heard. Central nervous system: Alert and oriented. No focal neurological deficits. Extremities: No edema, no clubbing ,no cyanosis, distal peripheral pulses  palpable.  Mild deformities of the hand joints Skin: No rashes, lesions or ulcers,no icterus ,no pallor MSK: Normal muscle bulk,tone ,power Psychiatry: Judgement and insight appear normal. Mood & affect appropriate.     Data Reviewed: I have personally reviewed following labs and imaging studies  CBC: Recent Labs  Lab 12/17/18 2354 12/18/18 0501 12/20/18 0254  WBC 14.8* 11.2* 11.8*  NEUTROABS 8.0*  --   --   HGB 13.9 12.3 13.0  HCT 44.1 40.6 41.3  MCV 95.0 96.0 94.9  PLT 349 345 240   Basic Metabolic Panel: Recent Labs  Lab 12/17/18 2354 12/20/18 0254  NA 139 139  K 4.2 3.7  CL 101 106  CO2 29 26  GLUCOSE 107* 133*  BUN 13 15  CREATININE 0.79 0.65  CALCIUM 8.9 8.4*   GFR: Estimated Creatinine Clearance: 54.3 mL/min (by C-G formula based on SCr of 0.65 mg/dL). Liver Function Tests: No results for input(s): AST, ALT, ALKPHOS, BILITOT, PROT, ALBUMIN in the last 168 hours. No results for input(s): LIPASE, AMYLASE in the last 168 hours. No results for input(s): AMMONIA in the last 168 hours. Coagulation Profile: No results for input(s): INR, PROTIME in the last 168 hours. Cardiac Enzymes: Recent Labs  Lab 12/18/18 2206 12/19/18 0341 12/19/18 1616 12/19/18 2204 12/20/18 0254  TROPONINI 2.10* 2.38* 2.42* 1.90* 1.17*   BNP (last 3 results) Recent Labs    03/21/18 1119  PROBNP 38.0   HbA1C: No results for input(s): HGBA1C in the last 72 hours. CBG: No results for input(s): GLUCAP in the last 168 hours. Lipid Profile: Recent Labs    12/19/18 1532  CHOL 222*  HDL 59  LDLCALC 137*  TRIG 129  CHOLHDL 3.8   Thyroid Function Tests: No results for input(s): TSH, T4TOTAL, FREET4, T3FREE, THYROIDAB in the last 72 hours. Anemia Panel: No results for input(s): VITAMINB12, FOLATE, FERRITIN, TIBC, IRON, RETICCTPCT in the last 72 hours. Sepsis Labs: Recent Labs  Lab 12/18/18 0242  PROCALCITON <0.10    Recent Results (from the past 240 hour(s))  SARS  Coronavirus 2 (CEPHEID- Performed in [facility] hospital lab), Hosp Order     Status: None   Collection Time: 12/17/18 11:42 PM  Result Value Ref Range Status   SARS Coronavirus 2 NEGATIVE NEGATIVE Final    Comment: (NOTE) If result is NEGATIVE SARS-CoV-2 target nucleic acids are NOT DETECTED. The SARS-CoV-2 RNA is generally detectable in upper and lower  respiratory specimens during the acute phase of infection. The lowest  concentration of SARS-CoV-2 viral copies this assay can detect is 250  copies / mL. A negative result does not preclude SARS-CoV-2 infection  and should not be used as the sole basis for treatment or other  patient management decisions.  A negative result may occur with  improper specimen collection / handling, submission of specimen other  than nasopharyngeal swab, presence of viral mutation(s) within the  areas targeted by this assay, and inadequate number of viral copies  (<250 copies / mL). A negative result must be combined with clinical  observations, patient history,  and epidemiological information. If result is POSITIVE SARS-CoV-2 target nucleic acids are DETECTED. The SARS-CoV-2 RNA is generally detectable in upper and lower  respiratory specimens dur ing the acute phase of infection.  Positive  results are indicative of active infection with SARS-CoV-2.  Clinical  correlation with patient history and other diagnostic information is  necessary to determine patient infection status.  Positive results do  not rule out bacterial infection or co-infection with other viruses. If result is PRESUMPTIVE POSTIVE SARS-CoV-2 nucleic acids MAY BE PRESENT.   A presumptive positive result was obtained on the submitted specimen  and confirmed on repeat testing.  While 2019 novel coronavirus  (SARS-CoV-2) nucleic acids may be present in the submitted sample  additional confirmatory testing may be necessary for epidemiological  and / or clinical management purposes  to  differentiate between  SARS-CoV-2 and other Sarbecovirus currently known to infect humans.  If clinically indicated additional testing with an alternate test  methodology [phone]) is advised. The SARS-CoV-2 RNA is generally  detectable in upper and lower respiratory sp ecimens during the acute  phase of infection. The expected result is Negative. Fact Sheet for Patients:  StrictlyIdeas.no Fact Sheet for Healthcare Providers: BankingDealers.co.za This test is not yet approved or cleared by the [place] FDA and has been authorized for detection and/or diagnosis of SARS-CoV-2 by FDA under an Emergency Use Authorization (EUA).  This EUA will remain in effect (meaning this test can be used) for the duration of the COVID-19 declaration under Section 564(b)(1) of the Act, 21 U.S.C. section 360bbb-3(b)(1), unless the authorization is terminated or revoked sooner. Performed at [facility], [place] [address], [place], [place] [address]          Radiology Studies: No results found.      Scheduled Meds: . amitriptyline  25 mg Oral QHS  . aspirin EC  81 mg Oral Daily  . gabapentin  600 mg Oral QHS  . guaiFENesin-dextromethorphan  10 mL Oral Q6H  . ipratropium-albuterol  3 mL Nebulization Q6H  . losartan  25 mg Oral Daily  . methylPREDNISolone (SOLU-MEDROL) injection  40 mg Intravenous Q12H  . pravastatin  20 mg Oral q1800  . venlafaxine XR  75 mg Oral QHS   Continuous Infusions: . heparin 800 Units/hr (12/19/18 1449)     LOS: 1 day    Time spent: 25 mins.More than 50% of that time was spent in counseling and/or coordination of care.      Shelly Coss, MD [facility] Pager [phone]  If 7PM-7AM, please contact night-coverage [email or web address] Password [email or web address] 12/20/2018, 12:26 PM

## 2018-12-20 NOTE — Progress Notes (Signed)
Progress Note  Patient Name: Stacey [name] Date of Encounter: 12/20/2018  Primary Cardiologist: Buford Dresser, MD  Subjective   No acute events overnight. Still coughing, but improving. No chest pain. Reviewed plan again today, see below.  Inpatient Medications    Scheduled Meds: . amitriptyline  25 mg Oral QHS  . aspirin EC  81 mg Oral Daily  . gabapentin  600 mg Oral QHS  . guaiFENesin-dextromethorphan  10 mL Oral Q6H  . ipratropium-albuterol  3 mL Nebulization Q6H  . losartan  25 mg Oral Daily  . methylPREDNISolone (SOLU-MEDROL) injection  40 mg Intravenous Q12H  . pravastatin  20 mg Oral q1800  . venlafaxine XR  75 mg Oral QHS   Continuous Infusions: . heparin 800 Units/hr (12/19/18 1449)   PRN Meds: acetaminophen **OR** acetaminophen, albuterol, benzonatate, hydrALAZINE   Vital Signs    Vitals:   12/19/18 2212 12/20/18 0148 12/20/18 0638 12/20/18 0911  BP: (!) 143/72  (!) 153/77   Pulse: 78  66   Resp: 20  16   Temp: 98.4 F (36.9 C)  98.3 F (36.8 C)   TempSrc: Oral  Oral   SpO2: 94% 93% 99% 95%  Weight:      Height:        Intake/Output Summary (Last 24 hours) at 12/20/2018 1015 Last data filed at 12/20/2018 0600 Gross per 24 hour  Intake 301.36 ml  Output 1900 ml  Net -1598.64 ml   Last 3 Weights 12/18/2018 12/18/2018 09/14/2018  Weight (lbs) 141 lb 1.5 oz 140 lb 143 lb  Weight (kg) 64 kg 63.504 kg 64.864 kg      Telemetry    SR with rare PVCs - Personally Reviewed  ECG    No new since 6/7, ordering this AM (prior baseline artifact) - Personally Reviewed  Physical Exam   GEN: No acute distress.  Frail appearing, [place] in place Neck: No JVD Cardiac: RRR, no murmurs, rubs, or gallops.  Respiratory: Generally clear, less velcro crackles today, still with rhonchi on expiration GI: Soft, nontender, non-distended  MS: No edema; No deformity. Neuro:  Nonfocal  Psych: Normal affect   Labs    Chemistry Recent Labs  Lab 12/17/18 2354  12/20/18 0254  NA 139 139  K 4.2 3.7  CL 101 106  CO2 29 26  GLUCOSE 107* 133*  BUN 13 15  CREATININE 0.79 0.65  CALCIUM 8.9 8.4*  GFRNONAA >60 >60  GFRAA >60 >60  ANIONGAP 9 7     Hematology Recent Labs  Lab 12/17/18 2354 12/18/18 0501 12/20/18 0254  WBC 14.8* 11.2* 11.8*  RBC 4.64 4.23 4.35  HGB 13.9 12.3 13.0  HCT 44.1 40.6 41.3  MCV 95.0 96.0 94.9  MCH 30.0 29.1 29.9  MCHC 31.5 30.3 31.5  RDW 13.6 13.7 13.9  PLT 349 345 321    Cardiac Enzymes Recent Labs  Lab 12/19/18 0341 12/19/18 1616 12/19/18 2204 12/20/18 0254  TROPONINI 2.38* 2.42* 1.90* 1.17*    Recent Labs  Lab 12/17/18 2359  TROPIPOC 0.11*     BNPNo results for input(s): BNP, PROBNP in the last 168 hours.   DDimer No results for input(s): DDIMER in the last 168 hours.   Radiology    No results found.  Cardiac Studies   Echo 12/18/18 1. The left ventricle has normal systolic function with an ejection fraction of 60-65%. The cavity size was normal. There is mildly increased left ventricular wall thickness. Left ventricular diastolic Doppler parameters are consistent  with impaired  relaxation. No evidence of left ventricular regional wall motion abnormalities. 2. The right ventricle has normal systolic function. The cavity was normal. There is no increase in right ventricular wall thickness. Right ventricular systolic pressure normal with an estimated pressure of 27.8 mmHg. 3. The mitral valve is grossly normal. There is mild mitral annular calcification present. 4. The tricuspid valve is grossly normal. There is mild tricuspid regurgitation. 5. The aortic valve is tricuspid with moderate calcification and thickening of the noncoronary cusp. 6. The aortic root is normal in size and structure.   Patient Profile     78 y.o. 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   Assessment & Plan    Elevated troponins: please see full conversation in my attestation to the consult note yesterday. We discussed full range of testing for this. Her echo is normal, she has no pain. Has elected for medical management. Cannot definitively state whether this is demand due to her hypoxic respiratory failure or whether it is a type I event, but will treat medically per the patient's preferences (see below)  Hyperlipidemia: on lipid panel yesterday AM, LDL 137. Goal is <70. See below re: statin  Coronary calcium on CT scan: discussed at length and reviewed again today that this is a marker of plaque, used as a surrogate for CAD.  -discussed aspirin and statin yesterday. Started aspirin, pravastatin. She wished to start with a lower intensity statin (did discuss rosuvastatin and atorvastatin yesterday) -she is unlikely to have a 50% reduction in her LDL on pravastatin, but will start with this and uptitrate/change if she tolerates statin -troponin downtrending, peak 2.38, today 1.17. Can stop trending now that it is decreasing. -would continue heparin for 48 hours at least (tomorrow AM) or continue during duration of admission -repeating ECG to ideally get better tracing without baseline artifact -discussed Mediterranean diet, prevention recommendations The 10-year ASCVD risk score Mikey Bussing DC Brooke Bonito., et al., 2013) is: 38.1%   Values used to calculate the score:     Age: 37 years     Sex: Female     Is Non-Hispanic African American: No     Diabetic: No     Tobacco smoker: No     Systolic Blood Pressure: 759 mmHg     Is BP treated: Yes     HDL Cholesterol: 59 mg/dL     Total Cholesterol: 222 mg/dL  Hypertension: rising today. Home losartan had been held. Will restart today. Adjusted PRN hydralazine dose (changed to oral, SBP >180) so that she will not receive unless needed.  Acute hypoxic respiratory failure, history of ILD: per primary team. She reports improving symptoms.   Rheumatoid arthritis, on rituxin: Followed at [facility]. We did discuss that inflammatory conditions are associated with increased risk of CAD.  For questions or updates, please contact [place] Please consult [email or web address] for contact info under     Signed, Buford Dresser, MD  12/20/2018, 10:15 AM

## 2018-12-20 NOTE — Progress Notes (Signed)
[facility] for heparin Indication: chest pain/ACS  Allergies  Allergen Reactions  . Penicillins Other (See Comments)    Reaction unknown occurred during childhood Has patient had a PCN reaction causing immediate rash, facial/tongue/throat swelling, SOB or lightheadedness with hypotension: Unknown Has patient had a PCN reaction causing severe rash involving mucus membranes or skin necrosis: Unknown Has patient had a PCN reaction that required hospitalization: No Has patient had a PCN reaction occurring within the last 10 years: No If all of the above answers are "NO", then may proceed with Cephalosporin use.  Unsur  . Remicade [Infliximab] Other (See Comments)    Reaction unknown  . Sulfa Antibiotics Other (See Comments)    Reaction unknown  . Sulfamethoxazole Other (See Comments)    Unsure of reaction As a child not sure     Patient Measurements: Height: 5\' 6"  (167.6 cm) Weight: 141 lb 1.5 oz (64 kg) IBW/kg (Calculated) : 59.3 Heparin Dosing Weight: 64 kg  Vital Signs: Temp: 98.3 F (36.8 C) (06/09 0638) Temp Source: Oral (06/09 0638) BP: 153/77 (06/09 5456) Pulse Rate: 66 (06/09 0638)  Labs: Recent Labs    12/17/18 2354  12/18/18 0501  12/19/18 1616 12/19/18 2204 12/20/18 0254  HGB 13.9  --  12.3  --   --   --  13.0  HCT 44.1  --  40.6  --   --   --  41.3  PLT 349  --  345  --   --   --  321  HEPARINUNFRC  --   --   --   --   --  0.59 0.64  CREATININE 0.79  --   --   --   --   --  0.65  TROPONINI  --    < > 0.78*   < > 2.42* 1.90* 1.17*   < > = values in this interval not displayed.    Estimated Creatinine Clearance: 54.3 mL/min (by C-G formula based on SCr of 0.65 mg/dL).   Assessment: Patient is a 78 y.o F wit hx asthma and interstitial lung disease presented to t he ED on 6/7 with c/o SOB. Pt's troponin continues to increase.  To start heparin drip for r/o ACS.  Today, 12/20/2018: - heparin level is therapeutic at  0.64 - CBC ok - no bleeding doc - cardiology recom med management for now   Goal of Therapy:  Heparin level 0.3-0.7 units/ml Monitor platelets by anticoagulation protocol: Yes   Plan:  -  Continue heparin drip at 800 units/hr -  daily heparin level - monitor for s/s bleeding  Dontai Pember P 12/20/2018,10:27 AM

## 2018-12-20 NOTE — TOC Initial Note (Signed)
Transition of Care [facility]) - Initial/Assessment Note    Patient Details  Name: Stacey [name] MRN: [id number] Date of Birth: 03-Nov-1940  [facility] CM/SW Contact:    Joaquin Courts, RN Phone Number: 12/20/2018, 9:52 AM  Clinical Narrative:    CM spoke with patient at bedside. Patient reports she has home oxygen provided by [employer] which she used as needed during the day and night. Should she require oxygen for discharge, patient states that her ride will be able to bring a portable tank. Patient has a pcp, Dr Virgina Jock, and is scheduled for a follow up appointment on Thursday which she can change if she does not d/c before then.  Patient formerly active with [facility] care management.  This CM spoke with [facility] hospital liaison will follow patient to assist with any needs in the community since patient has had multiple admissions with asthma issues.                 Expected Discharge Plan: Home/Self Care Barriers to Discharge: Continued Medical Work up   Patient Goals and CMS Choice Patient states their goals for this hospitalization and ongoing recovery are:: to go home      Expected Discharge Plan and Services Expected Discharge Plan: Home/Self Care   Discharge Planning Services: CM Consult   Living arrangements for the past 2 months: Single Family Home                 DME Arranged: N/A DME Agency: NA       HH Arranged: NA HH Agency: NA        Prior Living Arrangements/Services Living arrangements for the past 2 months: Single Family Home Lives with:: Spouse Patient language and need for interpreter reviewed:: Yes Do you feel safe going back to the place where you live?: Yes      Need for Family Participation in Patient Care: Yes (Comment) Care giver support system in place?: Yes (comment) Current home services: Other (comment)(Home Oxygen) Criminal Activity/Legal Involvement Pertinent to Current Situation/Hospitalization: No - Comment as  needed  Activities of Daily Living Home Assistive Devices/Equipment: Bedside commode/3-in-1, Oxygen, Nebulizer ADL Screening (condition at time of admission) Patient's cognitive ability adequate to safely complete daily activities?: Yes Is the patient deaf or have difficulty hearing?: No Does the patient have difficulty seeing, even when wearing glasses/contacts?: No Does the patient have difficulty concentrating, remembering, or making decisions?: No Patient able to express need for assistance with ADLs?: Yes Does the patient have difficulty dressing or bathing?: No Independently performs ADLs?: Yes (appropriate for developmental age) Does the patient have difficulty walking or climbing stairs?: No Weakness of Legs: None Weakness of Arms/Hands: None  Permission Sought/Granted                  Emotional Assessment Appearance:: Appears stated age Attitude/Demeanor/Rapport: Engaged Affect (typically observed): Accepting Orientation: : Oriented to Self, Oriented to Place, Oriented to  Time, Oriented to Situation   Psych Involvement: No (comment)  Admission diagnosis:  SOB (shortness of breath) [R06.02] Elevated troponin [R79.89] Acute respiratory failure with hypoxia (HCC) [J96.01] Severe asthma with exacerbation, unspecified whether persistent [J45.901] Patient Active Problem List   Diagnosis Date Noted  . Acute asthma exacerbation 12/18/2018  . Elevated troponin 12/18/2018  . Acute bronchitis 08/30/2018  . Chronic respiratory failure with hypoxia ([place]) 04/06/2018  . Acute respiratory failure with hypoxia ([place]) 03/26/2018  . Essential hypertension 03/24/2018  . Depression 03/24/2018  . Asthma exacerbation  03/23/2018  . DOE (dyspnea on exertion) 03/21/2018  . 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 esophagitis 03/21/2018  . Chronic lung disease   . Hypoxia   . Dyspnea 01/23/2018  . ILD (interstitial lung disease) ([place]) 01/23/2018  . 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 Multiple pulmonary nodules/RA lung dz  05/29/2013  . Cough variant asthma vs uacs  05/29/2013  . Rheumatoid arthritis ([place]) 01/13/2012  . Osteopenia 01/13/2012  . Lichen sclerosus et atrophicus of the vulva 01/13/2012  . Asthma 01/13/2012   PCP:  Shon Baton, MD Pharmacy:   [facility], [place] [address] Phone: [phone] Fax: [phone]     Social Determinants of Health (SDOH) Interventions    Readmission Risk Interventions No flowsheet data found.

## 2018-12-21 DIAGNOSIS — J4541 Moderate persistent asthma with (acute) exacerbation: Secondary | ICD-10-CM

## 2018-12-21 DIAGNOSIS — E7800 Pure hypercholesterolemia, unspecified: Secondary | ICD-10-CM

## 2018-12-21 LAB — CBC
HCT: 41.5 % (ref 36.0–46.0)
Hemoglobin: 12.4 g/dL (ref 12.0–15.0)
MCH: 29.3 pg (ref 26.0–34.0)
MCHC: 29.9 g/dL — ABNORMAL LOW (ref 30.0–36.0)
MCV: 98.1 fL (ref 80.0–100.0)
Platelets: 284 10*3/uL (ref 150–400)
RBC: 4.23 MIL/uL (ref 3.87–5.11)
RDW: 14.1 % (ref 11.5–15.5)
WBC: 12 10*3/uL — ABNORMAL HIGH (ref 4.0–10.5)
nRBC: 0 % (ref 0.0–0.2)

## 2018-12-21 LAB — HEPARIN LEVEL (UNFRACTIONATED): Heparin Unfractionated: 0.57 IU/mL (ref 0.30–0.70)

## 2018-12-21 MED ORDER — ENOXAPARIN SODIUM 40 MG/0.4ML ~~LOC~~ SOLN
40.0000 mg | SUBCUTANEOUS | Status: DC
  Administered 2018-12-21 – 2018-12-23 (×3): 40 mg via SUBCUTANEOUS
  Filled 2018-12-21 (×3): qty 0.4

## 2018-12-21 MED ORDER — BUDESONIDE 0.5 MG/2ML IN SUSP
0.5000 mg | Freq: Two times a day (BID) | RESPIRATORY_TRACT | Status: DC
  Administered 2018-12-21 – 2018-12-24 (×6): 0.5 mg via RESPIRATORY_TRACT
  Filled 2018-12-21 (×6): qty 2

## 2018-12-21 MED ORDER — MONTELUKAST SODIUM 10 MG PO TABS
10.0000 mg | ORAL_TABLET | Freq: Every day | ORAL | Status: DC
  Administered 2018-12-21 – 2018-12-23 (×3): 10 mg via ORAL
  Filled 2018-12-21 (×3): qty 1

## 2018-12-21 MED ORDER — ARFORMOTEROL TARTRATE 15 MCG/2ML IN NEBU
15.0000 ug | INHALATION_SOLUTION | Freq: Two times a day (BID) | RESPIRATORY_TRACT | Status: DC
  Administered 2018-12-21 – 2018-12-24 (×6): 15 ug via RESPIRATORY_TRACT
  Filled 2018-12-21 (×5): qty 2

## 2018-12-21 NOTE — Progress Notes (Addendum)
PROGRESS NOTE    Stacey [name]  BZJ:[id number] DOB: March 17, 1941 DOA: 12/17/2018 PCP: Shon Baton, MD   Brief Narrative: Patient is a 78 year old female with history of asthma, interstitial lung disease being followed by outpatient pulmonology, rheumatoid arthritis on Rituxan, hypertension, mitral valve prolapse who presented the emergency room with complaints of shortness of breath.  Patient was admitted for the management of exacerbation.  She reports that she had 2-3 exacerbations in the last 6 months.  She is on 2 L of oxygen at home as needed. Patient started on IV steroids, bronchodilators.  Covid-19 test was negative. Her hospital course was remarkable for gradual increment  in troponin most likely because of type 2 MI. Cardiology consulted.  Denies any chest pain.   Pulmonology consulted as per patient's request.   Assessment & Plan:   Principal Problem:   Acute asthma exacerbation Active Problems:   Rheumatoid arthritis ([place])   ILD (interstitial lung disease) ([place])   Essential hypertension   Elevated troponin  Acute hypoxic failure secondary to asthma exacerbation: History of asthma.  Started on bronchodilators.  Continue steroids.  Respiratory status is gradually improving.  She feels better than yesterday.  Chest x-ray on presentation did not show any focal consolidation.   She also has H/O ILD.She follows with pulmonology, Dr. Chase Caller. Patient requested for pulmonology consult.  I have discussed with Dr. Halford Chessman and he is happy to see her today.  Elevated troponin: Denies any chest pain.    EKG did not show any ischemic changes.  Echocardiogram showed normal left ventricular function, normal ejection fraction, impaired relaxation of left ventricle, no wall motion abnormalities.  This is most likely due to supply demand ischemia.  She was started on heparin drip.  Troponin is trending down now.  Cardiology was following.  Started on aspirin.  Heparin drip discontinued.  She  will follow-up with cardiology as an outpatient.  Hyperlipidemia: LDL of 137.  Started on pravastatin  Seropositive rheumatoid arthritis: Followed by rheumatology at [facility].  Being treated with Rituxan  Hypertension: Currently normotensive.  Continue PRN meds         DVT prophylaxis: IV heparin Code Status: Full Family Communication: None present at the bedside Disposition Plan: Home after improvement in the respiratory status  Consultants: Cardiology  Procedures: None  Antimicrobials:  Anti-infectives (From admission, onward)   Start     Dose/Rate Route Frequency Ordered Stop   12/18/18 0130  cefTRIAXone (ROCEPHIN) 1 g in sodium chloride 0.9 % 100 mL IVPB     1 g 200 mL/hr over 30 Minutes Intravenous  Once 12/18/18 0119 12/18/18 0203   12/18/18 0130  azithromycin (ZITHROMAX) 500 mg in sodium chloride 0.9 % 250 mL IVPB     500 mg 250 mL/hr over 60 Minutes Intravenous  Once 12/18/18 0119 12/18/18 0242      Subjective: Patient seen and examined the bedside this morning.  She feels better than yesterday but continues to be bothered by cough.  Minimal wheezes on auscultation.  Objective: Vitals:   12/20/18 2242 12/21/18 0107 12/21/18 0637 12/21/18 0859  BP: (!) 154/68  139/69   Pulse: 76  66   Resp: 20  20   Temp: 98.2 F (36.8 C)  98.5 F (36.9 C)   TempSrc: Oral  Oral   SpO2: 94% 94% 98% 96%  Weight:      Height:        Intake/Output Summary (Last 24 hours) at 12/21/2018 1307 Last data filed at 12/21/2018 0900  Gross per 24 hour  Intake 732.08 ml  Output 1351 ml  Net -618.92 ml   Filed Weights   12/18/18 0022 12/18/18 0318  Weight: 63.5 kg 64 kg    Examination:  General exam: Not in distress,thin built HEENT:PERRL,Oral mucosa moist, Ear/Nose normal on gross exam Respiratory system: Bilateral decreased air entry, mild expiratory wheezes Cardiovascular system: S1 & S2 heard, RRR. No JVD, murmurs, rubs, gallops or clicks. Gastrointestinal system: Abdomen is  nondistended, soft and nontender. No organomegaly or masses felt. Normal bowel sounds heard. Central nervous system: Alert and oriented. No focal neurological deficits. Extremities: No edema, no clubbing ,no cyanosis, distal peripheral pulses palpable. Mild deformities of the hand joints Skin: No rashes, lesions or ulcers,no icterus ,no pallor MSK: Normal muscle bulk,tone ,power   Data Reviewed: I have personally reviewed following labs and imaging studies  CBC: Recent Labs  Lab 12/17/18 2354 12/18/18 0501 12/20/18 0254 12/21/18 0550  WBC 14.8* 11.2* 11.8* 12.0*  NEUTROABS 8.0*  --   --   --   HGB 13.9 12.3 13.0 12.4  HCT 44.1 40.6 41.3 41.5  MCV 95.0 96.0 94.9 98.1  PLT 349 345 321 027   Basic Metabolic Panel: Recent Labs  Lab 12/17/18 2354 12/20/18 0254  NA 139 139  K 4.2 3.7  CL 101 106  CO2 29 26  GLUCOSE 107* 133*  BUN 13 15  CREATININE 0.79 0.65  CALCIUM 8.9 8.4*   GFR: Estimated Creatinine Clearance: 54.3 mL/min (by C-G formula based on SCr of 0.65 mg/dL). Liver Function Tests: No results for input(s): AST, ALT, ALKPHOS, BILITOT, PROT, ALBUMIN in the last 168 hours. No results for input(s): LIPASE, AMYLASE in the last 168 hours. No results for input(s): AMMONIA in the last 168 hours. Coagulation Profile: No results for input(s): INR, PROTIME in the last 168 hours. Cardiac Enzymes: Recent Labs  Lab 12/18/18 2206 12/19/18 0341 12/19/18 1616 12/19/18 2204 12/20/18 0254  TROPONINI 2.10* 2.38* 2.42* 1.90* 1.17*   BNP (last 3 results) Recent Labs    03/21/18 1119  PROBNP 38.0   HbA1C: No results for input(s): HGBA1C in the last 72 hours. CBG: No results for input(s): GLUCAP in the last 168 hours. Lipid Profile: Recent Labs    12/19/18 1532  CHOL 222*  HDL 59  LDLCALC 137*  TRIG 129  CHOLHDL 3.8   Thyroid Function Tests: No results for input(s): TSH, T4TOTAL, FREET4, T3FREE, THYROIDAB in the last 72 hours. Anemia Panel: No results for  input(s): VITAMINB12, FOLATE, FERRITIN, TIBC, IRON, RETICCTPCT in the last 72 hours. Sepsis Labs: Recent Labs  Lab 12/18/18 0242  PROCALCITON <0.10    Recent Results (from the past 240 hour(s))  SARS Coronavirus 2 (CEPHEID- Performed in [facility] hospital lab), Hosp Order     Status: None   Collection Time: 12/17/18 11:42 PM  Result Value Ref Range Status   SARS Coronavirus 2 NEGATIVE NEGATIVE Final    Comment: (NOTE) If result is NEGATIVE SARS-CoV-2 target nucleic acids are NOT DETECTED. The SARS-CoV-2 RNA is generally detectable in upper and lower  respiratory specimens during the acute phase of infection. The lowest  concentration of SARS-CoV-2 viral copies this assay can detect is 250  copies / mL. A negative result does not preclude SARS-CoV-2 infection  and should not be used as the sole basis for treatment or other  patient management decisions.  A negative result may occur with  improper specimen collection / handling, submission of specimen other  than nasopharyngeal  swab, presence of viral mutation(s) within the  areas targeted by this assay, and inadequate number of viral copies  (<250 copies / mL). A negative result must be combined with clinical  observations, patient history, and epidemiological information. If result is POSITIVE SARS-CoV-2 target nucleic acids are DETECTED. The SARS-CoV-2 RNA is generally detectable in upper and lower  respiratory specimens dur ing the acute phase of infection.  Positive  results are indicative of active infection with SARS-CoV-2.  Clinical  correlation with patient history and other diagnostic information is  necessary to determine patient infection status.  Positive results do  not rule out bacterial infection or co-infection with other viruses. If result is PRESUMPTIVE POSTIVE SARS-CoV-2 nucleic acids MAY BE PRESENT.   A presumptive positive result was obtained on the submitted specimen  and confirmed on repeat testing.   While 2019 novel coronavirus  (SARS-CoV-2) nucleic acids may be present in the submitted sample  additional confirmatory testing may be necessary for epidemiological  and / or clinical management purposes  to differentiate between  SARS-CoV-2 and other Sarbecovirus currently known to infect humans.  If clinically indicated additional testing with an alternate test  methodology [id number]) is advised. The SARS-CoV-2 RNA is generally  detectable in upper and lower respiratory sp ecimens during the acute  phase of infection. The expected result is Negative. Fact Sheet for Patients:  StrictlyIdeas.no Fact Sheet for Healthcare Providers: BankingDealers.co.za This test is not yet approved or cleared by the [place] FDA and has been authorized for detection and/or diagnosis of SARS-CoV-2 by FDA under an Emergency Use Authorization (EUA).  This EUA will remain in effect (meaning this test can be used) for the duration of the COVID-19 declaration under Section 564(b)(1) of the Act, 21 U.S.C. section 360bbb-3(b)(1), unless the authorization is terminated or revoked sooner. Performed at [facility], [place] [address], [place], [place] [address]          Radiology Studies: No results found.      Scheduled Meds: . amitriptyline  25 mg Oral QHS  . aspirin EC  81 mg Oral Daily  . gabapentin  600 mg Oral QHS  . guaiFENesin-dextromethorphan  10 mL Oral Q6H  . ipratropium-albuterol  3 mL Nebulization Q6H  . losartan  25 mg Oral Daily  . methylPREDNISolone (SOLU-MEDROL) injection  40 mg Intravenous Q12H  . pravastatin  20 mg Oral q1800  . venlafaxine XR  75 mg Oral QHS   Continuous Infusions:    LOS: 2 days    Time spent: 25 mins.More than 50% of that time was spent in counseling and/or coordination of care.      Shelly Coss, MD [facility] Pager [phone]  If 7PM-7AM, please contact  night-coverage [email or web address] Password [email or web address] 12/21/2018, 1:07 PM

## 2018-12-21 NOTE — Progress Notes (Signed)
Stacey [name] for heparin Indication: chest pain/ACS  Allergies  Allergen Reactions  . Penicillins Other (See Comments)    Reaction unknown occurred during childhood Has patient had a PCN reaction causing immediate rash, facial/tongue/throat swelling, SOB or lightheadedness with hypotension: Unknown Has patient had a PCN reaction causing severe rash involving mucus membranes or skin necrosis: Unknown Has patient had a PCN reaction that required hospitalization: No Has patient had a PCN reaction occurring within the last 10 years: No If all of the above answers are "NO", then may proceed with Cephalosporin use.  Unsur  . Remicade [Infliximab] Other (See Comments)    Reaction unknown  . Sulfa Antibiotics Other (See Comments)    Reaction unknown  . Sulfamethoxazole Other (See Comments)    Unsure of reaction As a child not sure     Patient Measurements: Height: 5\' 6"  (167.6 cm) Weight: 141 lb 1.5 oz (64 kg) IBW/kg (Calculated) : 59.3 Heparin Dosing Weight: 64 kg  Vital Signs: Temp: 98.5 F (36.9 C) (06/10 0637) Temp Source: Oral (06/10 0637) BP: 139/69 (06/10 5456) Pulse Rate: 66 (06/10 0637)  Labs: Recent Labs    12/19/18 1616 12/19/18 2204 12/20/18 0254 12/21/18 0550  HGB  --   --  13.0 12.4  HCT  --   --  41.3 41.5  PLT  --   --  321 284  HEPARINUNFRC  --  0.59 0.64 0.57  CREATININE  --   --  0.65  --   TROPONINI 2.42* 1.90* 1.17*  --     Estimated Creatinine Clearance: 54.3 mL/min (by C-G formula based on SCr of 0.65 mg/dL).   Assessment: Patient is a 78 y.o F wit hx asthma and interstitial lung disease presented to t he ED on 6/7 with c/o SOB. Pt's troponin continues to increase.  To start heparin drip for r/o ACS.  Today, 12/21/2018: - heparin level is therapeutic at 0.57 - CBC ok - no bleeding doc - cardiology recom med management for now   Goal of Therapy:  Heparin level 0.3-0.7 units/ml Monitor platelets by  anticoagulation protocol: Yes   Plan:  -  Continue heparin drip at 800 units/hr -  daily heparin level - monitor for s/s bleeding  Stacey [name] P 12/21/2018,10:59 AM

## 2018-12-21 NOTE — Consult Note (Signed)
   Stacey [name]   12/21/2018  Stacey [name] 10/07/1940 [id number]   Patient screened for potential [facility] Care Management services. Referral placed by inpatient RNCM to assess for community needs.  Called and spoke with [name] by telephone. Verified identity with HIPAA verifiable identifiers. Explained to [name] Management program for chronic disease management. Patient had been active in the past.   Patient consents to have community RN follow up when she returns home. States that she lives with her husband and he is able to help her with any needs. States that she would appreciate the follow up to make sure everything is going well.   Referral placed for [facility] CM community RN follow up.  Of note, [facility] Care Management services does not replace or interfere with any services that are arranged by inpatient case management or social work.  Netta Cedars, MSN, [facility] Liaison Nurse Mobile Phone [phone]  Toll free office [phone]

## 2018-12-21 NOTE — Consult Note (Signed)
NAME:  Stacey [name], MRN:  [id number], DOB:  [birth year], LOS: 2 ADMISSION DATE:  12/17/2018, CONSULTATION DATE:  12/21/2018 REFERRING MD:  Dr. Tawanna Solo, [facility], CHIEF COMPLAINT:  Short of breath   Brief History   78 yr old female presented to ER with shortness of breath, wheezing.  She had been on treated as outpt for asthma exacerbation, but failed to improve.  PCCM asked to assist with respiratory management.  History of present illness   78 yr old female presented to ER with dyspnea and wheezing after failed outpt tx for asthma exacerbation.  Treated with steroids, bronchodilators, and ABx.  Had elevated troponin and cardiology consulted.    Her symptoms started several days prior to admission.  She was getting cough, wheeze, and chest tightness.  Was bringing up small amounts of green sputum intermittently.  No sinus congestion, sore throat, fever, or hemoptysis.  No sick exposures, travel history, or environmental exposures.  She uses dulera at home.  Doesn't have spacer device.  Uses albuterol nebulizer when she has a flare.  Has rescue inhaler, but wasn't sure when to use it.  When she gets flares she feels she needs to be on prednisone 40 mg to get symptoms under control.  He is followed by Dr. Chase Caller for pulmonary and Dr. Eda Paschal for rheumatology at [facility].  She was started on rituxan earlier this year, and takes 4 mg prednisone alternating with 2 mg prednisone.  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, HTN, MVP, chronic hypoxic respiratory failure on 2 liters with exertion, CAD, HLD, Post herpetic neuralgia, Allergic rhinitis, Osteoporosis, GERD, MAI 2001  [facility] Events   6/06 Admit 6/08 Cardiology consulted, start heparin gtt 6/10 d/c heparin gtt  Consults:  Cardiology 6/08 elevated troponin  Procedures:    Significant Diagnostic Tests:  CT angio chest 03/24/18 >> atherosclerosis, apical scarring,  subpleural reticular changes and septal thickening, decreased GGO RLL PFT 06/07/18 >> FEV1 1.25 (63%), FEV1 69%, DLCO 59% Echo 12/18/18 >> EF 60 to 65%  Micro Data:  COVID 6/06 >> negative  Antimicrobials:  Rocephin 6/06 >> 6/06 Zithromax 6/06 >> 6/06  Interim history/subjective:    Objective   Blood pressure 139/69, pulse 66, temperature 98.5 F (36.9 C), temperature source Oral, resp. rate 20, height 5\' 6"  (1.676 m), weight 64 kg, SpO2 96 %.        Intake/Output Summary (Last 24 hours) at 12/21/2018 1259 Last data filed at 12/21/2018 0900 Gross per 24 hour  Intake 732.08 ml  Output 1351 ml  Net -618.92 ml   Filed Weights   12/18/18 0022 12/18/18 0318  Weight: 63.5 kg 64 kg    Examination:  General - alert Eyes - pupils reactive ENT - no sinus tenderness, no stridor Cardiac - regular rate/rhythm, no murmur Chest - coarse breath sounds b/l with scattered rhonchi, faint wheeze b/l Abdomen - soft, non tender, + bowel sounds Extremities - changes of RA in her fingers Skin - no rashes Neuro - normal strength, moves extremities, follows commands Lymphatics - no lymphadenopathy Psych - normal mood and behavior  CXR (reviewed by me) - hyperinflation, prominent interstitial markings, density LUL   Resolved Hospital Problem list     Assessment & Plan:   Acute asthma exacerbation seems to be related to environmental exposure with weather change. Plan - continue solumedrol - add singulair - add brovana, pulmicort - continue duoneb - continue robitussin - will need to arrange for  spacer device to use with dulera and albuterol as outpt - had extensive discuss on how to use inhalers, and what to do when she has flare (including starting prednisone burst while waiting to hear back from office) - don't think she needs ABx at this time  Lt lung density on admission CXR. Plan - repeat PA/Lateral CXR 6/11  Hx of rheumatoid arthritis with ILD. Plan - f/u with  rheumatology as outpt - followed by Dr. Chase Caller as outpt  Elevated troponin. Plan - per primary team and cardiology   Best practice:  Diet: heart healthy DVT prophylaxis: lovenox GI prophylaxis: Not indicated Mobility: As tolerated Code Status: full code Disposition: telemetry  Labs   CBC: Recent Labs  Lab 12/17/18 2354 12/18/18 0501 12/20/18 0254 12/21/18 0550  WBC 14.8* 11.2* 11.8* 12.0*  NEUTROABS 8.0*  --   --   --   HGB 13.9 12.3 13.0 12.4  HCT 44.1 40.6 41.3 41.5  MCV 95.0 96.0 94.9 98.1  PLT 349 345 321 767    Basic Metabolic Panel: Recent Labs  Lab 12/17/18 2354 12/20/18 0254  NA 139 139  K 4.2 3.7  CL 101 106  CO2 29 26  GLUCOSE 107* 133*  BUN 13 15  CREATININE 0.79 0.65  CALCIUM 8.9 8.4*   GFR: Estimated Creatinine Clearance: 54.3 mL/min (by C-G formula based on SCr of 0.65 mg/dL). Recent Labs  Lab 12/17/18 2354 12/18/18 0242 12/18/18 0501 12/20/18 0254 12/21/18 0550  PROCALCITON  --  <0.10  --   --   --   WBC 14.8*  --  11.2* 11.8* 12.0*    Liver Function Tests: No results for input(s): AST, ALT, ALKPHOS, BILITOT, PROT, ALBUMIN in the last 168 hours. No results for input(s): LIPASE, AMYLASE in the last 168 hours. No results for input(s): AMMONIA in the last 168 hours.  ABG    Component Value Date/Time   PHART 7.420 12/18/2018 0010   PCO2ART 46.1 12/18/2018 0010   PO2ART 78.0 (L) 12/18/2018 0010   HCO3 29.3 (H) 12/18/2018 0010   TCO2 31 12/18/2009 0347   O2SAT 95.4 12/18/2018 0010     Coagulation Profile: No results for input(s): INR, PROTIME in the last 168 hours.  Cardiac Enzymes: Recent Labs  Lab 12/18/18 2206 12/19/18 0341 12/19/18 1616 12/19/18 2204 12/20/18 0254  TROPONINI 2.10* 2.38* 2.42* 1.90* 1.17*    HbA1C: Hgb A1c MFr Bld  Date/Time Value Ref Range Status  08/20/2015 11:10 AM 5.9 (H) 4.8 - 5.6 % Final    Comment:             Pre-diabetes: 5.7 - 6.4          Diabetes: >6.4          Glycemic control  for adults with diabetes: <7.0     CBG: No results for input(s): GLUCAP in the last 168 hours.  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   Surgical History    Past Surgical History:  Procedure Laterality Date  . CHOLECYSTECTOMY  2011  . WISDOM TOOTH EXTRACTION       Social History   reports that she has never smoked. She has never used smokeless tobacco. She  reports that she does not drink alcohol or use drugs.   Family History   Her family history includes Anemia in her mother; Asthma in her mother; Breast cancer (age of onset: 75) in her maternal grandmother; COPD in her father; Polymyalgia rheumatica in her mother; Pulmonary fibrosis in her father.   Allergies Allergies  Allergen Reactions  . Penicillins Other (See Comments)    Reaction unknown occurred during childhood Has patient had a PCN reaction causing immediate rash, facial/tongue/throat swelling, SOB or lightheadedness with hypotension: Unknown Has patient had a PCN reaction causing severe rash involving mucus membranes or skin necrosis: Unknown Has patient had a PCN reaction that required hospitalization: No Has patient had a PCN reaction occurring within the last 10 years: No If all of the above answers are "NO", then may proceed with Cephalosporin use.  Unsur  . Remicade [Infliximab] Other (See Comments)    Reaction unknown  . Sulfa Antibiotics Other (See Comments)    Reaction unknown  . Sulfamethoxazole Other (See Comments)    Unsure of reaction As a child not sure      Home Medications  Prior to Admission medications    Medication Sig Start Date End Date Taking? Authorizing Provider  albuterol (PROAIR HFA) 108 (90 Base) MCG/ACT inhaler Inhale 2 puffs into the lungs every 4 (four) hours as needed for wheezing or shortness of breath. 04/06/18  Yes Parrett, Tammy S, NP  albuterol (PROVENTIL) (2.5 MG/3ML) 0.083% nebulizer solution Inhale 3 mLs (2.5 mg total) into the lungs every 4 (four) hours as needed for wheezing or shortness of breath. 12/07/18  Yes Lauraine Rinne, NP  amitriptyline (ELAVIL) 25 MG tablet Take 25 mg by mouth at bedtime. 02/04/15  Yes [provider]  beta carotene w/minerals (OCUVITE) tablet Take 1 tablet by mouth daily.   Yes [provider]  cholecalciferol (VITAMIN D) 1000 UNITS tablet Take 1,000 Units by mouth daily.   Yes [provider]  dextromethorphan (DELSYM) 30 MG/5ML liquid Take 2.5 mLs (15 mg total) by mouth at bedtime. Patient taking differently: Take 15 mg by mouth as needed for cough.  01/27/18  Yes Lavina Hamman, MD  gabapentin (NEURONTIN) 300 MG capsule Take 600 mg by mouth at bedtime.   Yes [provider]  losartan (COZAAR) 25 MG tablet Take 25 mg by mouth daily. 02/04/15  Yes [provider]  methylPREDNISolone (MEDROL) 4 MG tablet Take 2-4 mg by mouth daily. Take 2 mg by mouth every other day alternating with 4 mg on the other days   Yes [provider]  mometasone-formoterol (DULERA) 100-5 MCG/ACT AERO Inhale 2 puffs into the lungs 2 (two) times daily. 11/07/18  Yes Brand Males, MD  pantoprazole (PROTONIX) 40 MG tablet Take 1 tablet (40 mg total) by mouth 2 (two) times daily before a meal. Patient taking differently: Take 40 mg by mouth daily.  07/18/18  Yes Nandigam, Venia Minks, MD  polyethylene glycol (MIRALAX / GLYCOLAX) packet Take 17 g by mouth daily. Patient taking differently: Take 17 g by mouth daily as needed (For constipation.).  01/27/18  Yes Lavina Hamman, MD  riTUXimab (RITUXAN) 100 MG/10ML injection Inject  into the vein. Receives at [facility] but unsure of dose - no more doses for next 6 months as of 09/14/18   Yes [provider]  venlafaxine XR (EFFEXOR-XR) 75 MG 24 hr capsule Take 75 mg by mouth at bedtime.    Yes [provider]  doxycycline (VIBRA-TABS) 100 MG  tablet Take 1 tablet (100 mg total) by mouth 2 (two) times daily. Patient not taking: Reported on 12/18/2018 11/07/18   Martyn Ehrich, NP  doxycycline (VIBRA-TABS) 100 MG tablet Take 1 tablet (100 mg total) by mouth 2 (two) times daily. Patient not taking: Reported on 12/18/2018 12/09/18   Lauraine Rinne, NP  levalbuterol [facility] HFA) 45 MCG/ACT inhaler Inhale 2 puffs into the lungs every 4 (four) hours as needed for wheezing. Patient not taking: Reported on 12/18/2018 08/30/18 08/30/19  Lauraine Rinne, NP  predniSONE (DELTASONE) 10 MG tablet Take 4 tabs po daily x 2 days; then 3 tabs for 2 days; then 2 tabs for 2 days; then 1 tab for 2 days Patient not taking: Reported on 12/18/2018 11/07/18   Martyn Ehrich, NP  predniSONE (DELTASONE) 10 MG tablet Take 2 tablets daily with food for 5 days. Patient not taking: Reported on 12/18/2018 12/09/18   Lauraine Rinne, NP    Chesley Mires, MD [place] 12/21/2018, 2:25 PM

## 2018-12-21 NOTE — Progress Notes (Signed)
Progress Note  Patient Name: Stacey [name] Date of Encounter: 12/21/2018  Primary Cardiologist: Buford Dresser, MD  Subjective   Had several episodes of severe coughing, worried about how she will deal with that when she goes home. Asked primary team to speak to pulmonology for some suggestions, awaiting that conversation. Has been up and walking. Tolerating medications.   Inpatient Medications    Scheduled Meds: . amitriptyline  25 mg Oral QHS  . aspirin EC  81 mg Oral Daily  . gabapentin  600 mg Oral QHS  . guaiFENesin-dextromethorphan  10 mL Oral Q6H  . ipratropium-albuterol  3 mL Nebulization Q6H  . losartan  25 mg Oral Daily  . methylPREDNISolone (SOLU-MEDROL) injection  40 mg Intravenous Q12H  . pravastatin  20 mg Oral q1800  . venlafaxine XR  75 mg Oral QHS   Continuous Infusions: . heparin 800 Units/hr (12/20/18 1830)   PRN Meds: acetaminophen **OR** acetaminophen, albuterol, benzonatate, hydrALAZINE   Vital Signs    Vitals:   12/20/18 2242 12/21/18 0107 12/21/18 0637 12/21/18 0859  BP: (!) 154/68  139/69   Pulse: 76  66   Resp: 20  20   Temp: 98.2 F (36.8 C)  98.5 F (36.9 C)   TempSrc: Oral  Oral   SpO2: 94% 94% 98% 96%  Weight:      Height:        Intake/Output Summary (Last 24 hours) at 12/21/2018 1244 Last data filed at 12/21/2018 0900 Gross per 24 hour  Intake 732.08 ml  Output 1351 ml  Net -618.92 ml   Last 3 Weights 12/18/2018 12/18/2018 09/14/2018  Weight (lbs) 141 lb 1.5 oz 140 lb 143 lb  Weight (kg) 64 kg 63.504 kg 64.864 kg      Telemetry    SR with rare PVCs - Personally Reviewed  ECG    SR with rare PVCs - Personally Reviewed  Physical Exam   GEN: frail appearing but in no acute distress HEENT: Normal NECK: No JVD CARDIAC: regular rhythm, normal S1 and S2, no murmurs, rubs, gallops. Radial and DP pulses 2+ bilaterally. RESPIRATORY: Generally clear, mild rhonchi, but improved from prior ABDOMEN: Soft, non-tender,  non-distended MUSCULOSKELETAL:  No edema; No deformity  SKIN: Warm and dry NEUROLOGIC:  Alert and oriented x 3 PSYCHIATRIC:  Normal affect    Labs    Chemistry Recent Labs  Lab 12/17/18 2354 12/20/18 0254  NA 139 139  K 4.2 3.7  CL 101 106  CO2 29 26  GLUCOSE 107* 133*  BUN 13 15  CREATININE 0.79 0.65  CALCIUM 8.9 8.4*  GFRNONAA >60 >60  GFRAA >60 >60  ANIONGAP 9 7     Hematology Recent Labs  Lab 12/18/18 0501 12/20/18 0254 12/21/18 0550  WBC 11.2* 11.8* 12.0*  RBC 4.23 4.35 4.23  HGB 12.3 13.0 12.4  HCT 40.6 41.3 41.5  MCV 96.0 94.9 98.1  MCH 29.1 29.9 29.3  MCHC 30.3 31.5 29.9*  RDW 13.7 13.9 14.1  PLT 345 321 284    Cardiac Enzymes Recent Labs  Lab 12/19/18 0341 12/19/18 1616 12/19/18 2204 12/20/18 0254  TROPONINI 2.38* 2.42* 1.90* 1.17*    Recent Labs  Lab 12/17/18 2359  TROPIPOC 0.11*     BNPNo results for input(s): BNP, PROBNP in the last 168 hours.   DDimer No results for input(s): DDIMER in the last 168 hours.   Radiology    No results found.  Cardiac Studies   Echo 12/18/18 1. The left  ventricle has normal systolic function with an ejection fraction of 60-65%. The cavity size was normal. There is mildly increased left ventricular wall thickness. Left ventricular diastolic Doppler parameters are consistent with impaired  relaxation. No evidence of left ventricular regional wall motion abnormalities. 2. The right ventricle has normal systolic function. The cavity was normal. There is no increase in right ventricular wall thickness. Right ventricular systolic pressure normal with an estimated pressure of 27.8 mmHg. 3. The mitral valve is grossly normal. There is mild mitral annular calcification present. 4. The tricuspid valve is grossly normal. There is mild tricuspid regurgitation. 5. The aortic valve is tricuspid with moderate calcification and thickening of the noncoronary cusp. 6. The aortic root is normal in size and  structure.   Patient Profile     78 y.o. 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  Assessment & Plan    Elevated troponins: please see full conversation in my attestation to the consult note 6/8 -treating medically, shared decision making with patient. Has had 48 hours of heparin, I have stopped. -started on aspirin and statin, see below  Hyperlipidemia: on lipid panel 6/8, LDL 137. Goal is <70. See below re: statin  Coronary calcium on CT scan:  -tolerating aspirin and pravastatin. Preference is for high intensity statin, but she has concerns re: myalgias. Monitor and uptitrate as able as outpatient -discussed Mediterranean diet, prevention recommendations. Will attach to discharge instructions  Hypertension: improved with restarting home losartan  No changes today: Acute hypoxic respiratory failure, history of ILD: per primary team. She reports improving symptoms but continues to have coughing spells  Rheumatoid arthritis, on rituxin: Followed at [facility]. We did discuss that inflammatory conditions are associated with increased risk of CAD.  [facility] will sign off.   Medication Recommendations:  Aspirin 81 mg, pravastatin 20 mg, losartan 25 mg daily Other recommendations (labs, testing, etc):  none Follow up as an outpatient:  We will arrange follow up for her to see me in clinic  For questions or updates, please contact [facility] Please consult [email or web address] for contact info under     Signed, Buford Dresser, MD  12/21/2018, 12:44 PM

## 2018-12-22 ENCOUNTER — Inpatient Hospital Stay (HOSPITAL_COMMUNITY): Payer: Medicare Other

## 2018-12-22 DIAGNOSIS — M069 Rheumatoid arthritis, unspecified: Secondary | ICD-10-CM

## 2018-12-22 MED ORDER — PREDNISONE 10 MG PO TABS
10.0000 mg | ORAL_TABLET | Freq: Two times a day (BID) | ORAL | Status: DC
  Administered 2018-12-22 – 2018-12-24 (×4): 10 mg via ORAL
  Filled 2018-12-22 (×4): qty 1

## 2018-12-22 MED ORDER — HYDROCOD POLST-CPM POLST ER 10-8 MG/5ML PO SUER
5.0000 mL | Freq: Every evening | ORAL | Status: DC | PRN
  Administered 2018-12-22 – 2018-12-23 (×2): 5 mL via ORAL
  Filled 2018-12-22 (×2): qty 5

## 2018-12-22 MED ORDER — PANTOPRAZOLE SODIUM 40 MG PO TBEC
40.0000 mg | DELAYED_RELEASE_TABLET | Freq: Two times a day (BID) | ORAL | Status: DC
  Administered 2018-12-22 – 2018-12-24 (×5): 40 mg via ORAL
  Filled 2018-12-22 (×5): qty 1

## 2018-12-22 NOTE — Care Management Important Message (Signed)
Important Message  Patient Details IM Letter given to Cookie McGibboney RN to present to the Patient Name: Stacey [name] MRN: [id number] Date of Birth: 21-Nov-1940   Medicare Important Message Given:  Yes    Kerin Salen 12/22/2018, 11:44 AM

## 2018-12-22 NOTE — Progress Notes (Signed)
NAME:  Stacey [name], MRN:  [id number], DOB:  12-Feb-1941, LOS: 3 ADMISSION DATE:  12/17/2018, CONSULTATION DATE:  12/21/2018 REFERRING MD:  Dr Tawanna Solo, CHIEF COMPLAINT: Shortness of breath  Brief History   78 yr old female presented to ER with shortness of breath, wheezing.  She had been on treated as outpt for asthma exacerbation, but failed to improve.  PCCM asked to assist with respiratory management.  History of present illness   78 yr old female presented to ER with dyspnea and wheezing after failed outpt tx for asthma exacerbation.  Treated with steroids, bronchodilators, and ABx.  Had elevated troponin and cardiology consulted.    Her symptoms started several days prior to admission.  She was getting cough, wheeze, and chest tightness.  Was bringing up small amounts of green sputum intermittently.  No sinus congestion, sore throat, fever, or hemoptysis.  No sick exposures, travel history, or environmental exposures.  She uses dulera at home.  Doesn't have spacer device.  Uses albuterol nebulizer when she has a flare.  Has rescue inhaler, but wasn't sure when to use it.  When she gets flares she feels she needs to be on prednisone 40 mg to get symptoms under control.  He is followed by Dr. Chase Caller for pulmonary and Dr. Eda Paschal for rheumatology at [facility].  She was started on rituxan earlier this year, and takes 4 mg prednisone alternating with 2 mg prednisone.  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, HTN, MVP, chronic hypoxic respiratory failure on 2 liters with exertion, CAD, HLD, Post herpetic neuralgia, Allergic rhinitis, Osteoporosis, GERD, MAI 2001   Significant Hospital Events   6/06 Admit 6/08 Cardiology consulted, start heparin gtt 6/10 d/c heparin gtt  Consults:  Cardiology 6/0 8 elevated troponin  Procedures:    Significant Diagnostic Tests:  CT angio chest 03/24/18 >> atherosclerosis, apical scarring,  subpleural reticular changes and septal thickening, decreased GGO RLL PFT 06/07/18 >> FEV1 1.25 (63%), FEV1 69%, DLCO 59% Echo 12/18/18 >> EF 60 to 65%  Micro Data:  COVID 6/06 >> negative  Antimicrobials:  Rocephin 6/06 >> 6/06 Zithromax 6/06 >> 6/06   Interim history/subjective:  Feels better Still has intermittent coughing spasms  Objective   Blood pressure 140/69, pulse 72, temperature (!) 97.5 F (36.4 C), temperature source Oral, resp. rate 17, height 5\' 6"  (1.676 m), weight 64 kg, SpO2 96 %.        Intake/Output Summary (Last 24 hours) at 12/22/2018 1032 Last data filed at 12/22/2018 0530 Gross per 24 hour  Intake 720 ml  Output 1303 ml  Net -583 ml   Filed Weights   12/18/18 0022 12/18/18 0318  Weight: 63.5 kg 64 kg    Examination: General: Elderly lady, does not appear to be in distress HENT: Moist oral mucosa Lungs: Coarse breath sounds with bilateral rhonchi Cardiovascular: S1-S2 appreciated Abdomen: Soft, nontender  Resolved Hospital Problem list     Assessment & Plan:  Acute asthma exacerbation -Steroids -Singulair -Brovana and Pulmicort --DuoNeb -Robitussin -Spacer to be used with Dulera as outpatient  Right lung density on chest x-ray -Repeat chest x-ray pending for today  History of rheumatoid arthritis with ILD -Follow-up with rheumatology as outpatient -Follow-up with Dr. Chase Caller as outpatient  Elevated troponin -Per primary team and cardiology  Hypoxemia -Assess for oxygen need with ambulatory oximetry  I did spend time talking with patient and her daughter on the phone Current concerns relating to coughing spasms, GERD,  how to optimize management of a cough She may continue with cough medicines including Tessalon Perles and Delsym as needed  Optimize treatment for GERD-patient is supposed to be on Protonix twice a day  Anxiety-optimize anti-anxiety medications, may consider adding low-dose benzodiazepines-risk versus benefits  need to be assessed Effexor dose may also need to be increased  I did switch her from Solu-Medrol to prednisone     Best practice:  Diet: Heart healthy DVT prophylaxis: Lovenox Mobility: As tolerated Code Status: Full code Family Communication: Telemetry Disposition: Switch to p.o. medications as able Stable from a pulmonary perspective  Labs   CBC: Recent Labs  Lab 12/17/18 2354 12/18/18 0501 12/20/18 0254 12/21/18 0550  WBC 14.8* 11.2* 11.8* 12.0*  NEUTROABS 8.0*  --   --   --   HGB 13.9 12.3 13.0 12.4  HCT 44.1 40.6 41.3 41.5  MCV 95.0 96.0 94.9 98.1  PLT 349 345 321 976    Basic Metabolic Panel: Recent Labs  Lab 12/17/18 2354 12/20/18 0254  NA 139 139  K 4.2 3.7  CL 101 106  CO2 29 26  GLUCOSE 107* 133*  BUN 13 15  CREATININE 0.79 0.65  CALCIUM 8.9 8.4*   GFR: Estimated Creatinine Clearance: 54.3 mL/min (by C-G formula based on SCr of 0.65 mg/dL). Recent Labs  Lab 12/17/18 2354 12/18/18 0242 12/18/18 0501 12/20/18 0254 12/21/18 0550  PROCALCITON  --  <0.10  --   --   --   WBC 14.8*  --  11.2* 11.8* 12.0*    Liver Function Tests: No results for input(s): AST, ALT, ALKPHOS, BILITOT, PROT, ALBUMIN in the last 168 hours. No results for input(s): LIPASE, AMYLASE in the last 168 hours. No results for input(s): AMMONIA in the last 168 hours.  ABG    Component Value Date/Time   PHART 7.420 12/18/2018 0010   PCO2ART 46.1 12/18/2018 0010   PO2ART 78.0 (L) 12/18/2018 0010   HCO3 29.3 (H) 12/18/2018 0010   TCO2 31 12/18/2009 0347   O2SAT 95.4 12/18/2018 0010     Coagulation Profile: No results for input(s): INR, PROTIME in the last 168 hours.  Cardiac Enzymes: Recent Labs  Lab 12/18/18 2206 12/19/18 0341 12/19/18 1616 12/19/18 2204 12/20/18 0254  TROPONINI 2.10* 2.38* 2.42* 1.90* 1.17*    HbA1C: Hgb A1c MFr Bld  Date/Time Value Ref Range Status  08/20/2015 11:10 AM 5.9 (H) 4.8 - 5.6 % Final    Comment:             Pre-diabetes:  5.7 - 6.4          Diabetes: >6.4          Glycemic control for adults with diabetes: <7.0     CBG: No results for input(s): GLUCAP in the last 168 hours.  Review of Systems:   Review of Systems  Constitutional: Negative.   Respiratory: Positive for cough and shortness of breath.      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   Surgical History    Past Surgical History:  Procedure Laterality Date  . CHOLECYSTECTOMY  2011  . WISDOM TOOTH EXTRACTION       Social History   reports that she has never smoked. She has never used  smokeless tobacco. She reports that she does not drink alcohol or use drugs.   Family History   Her family history includes Anemia in her mother; Asthma in her mother; Breast cancer (age of onset: 58) in her maternal grandmother; COPD in her father; Polymyalgia rheumatica in her mother; Pulmonary fibrosis in her father.   Allergies Allergies  Allergen Reactions  . Penicillins Other (See Comments)    Reaction unknown occurred during childhood Has patient had a PCN reaction causing immediate rash, facial/tongue/throat swelling, SOB or lightheadedness with hypotension: Unknown Has patient had a PCN reaction causing severe rash involving mucus membranes or skin necrosis: Unknown Has patient had a PCN reaction that required hospitalization: No Has patient had a PCN reaction occurring within the last 10 years: No If all of the above answers are "NO", then may proceed with Cephalosporin use.  Unsur  . Remicade [Infliximab] Other (See Comments)    Reaction unknown  . Sulfa Antibiotics Other (See Comments)    Reaction  unknown  . Sulfamethoxazole Other (See Comments)    Unsure of reaction As a child not sure     Sherrilyn Rist, MD [place], PCCM Cell: [phone]

## 2018-12-22 NOTE — TOC Initial Note (Signed)
Transition of Care [facility]) - Initial/Assessment Note    Patient Details  Name: Stacey [name] MRN: [id number] Date of Birth: [birth year]  [facility] CM/SW Contact:    Purcell Mouton, RN Phone Number: 12/22/2018, 4:06 PM  Clinical Narrative:    Acute asthma exacerbation, Acute hypoxic failure secondary to asthma exacerbation.                 Expected Discharge Plan: Home/Self Care Barriers to Discharge: Continued Medical Work up   Patient Goals and CMS Choice Patient states their goals for this hospitalization and ongoing recovery are:: To get better and go home.      Expected Discharge Plan and Services Expected Discharge Plan: Home/Self Care In-house [facility]erral: [facility] Discharge Planning Services: CM Consult   Living arrangements for the past 2 months: Single Family Home                 DME Arranged: N/A DME Agency: NA       HH Arranged: NA HH Agency: NA        Prior Living Arrangements/Services Living arrangements for the past 2 months: [place] with:: Spouse Patient language and need for interpreter reviewed:: No Do you feel safe going back to the place where you live?: Yes      Need for Family Participation in Patient Care: Yes (Comment) Care giver support system in place?: Yes (comment) Current home services: Other (comment)(Home Oxygen) Criminal Activity/Legal Involvement Pertinent to Current Situation/Hospitalization: No - Comment as needed  Activities of Daily Living Home Assistive Devices/Equipment: Bedside commode/3-in-1, Oxygen, Nebulizer ADL Screening (condition at time of admission) Patient's cognitive ability adequate to safely complete daily activities?: Yes Is the patient deaf or have difficulty hearing?: No Does the patient have difficulty seeing, even when wearing glasses/contacts?: No Does the patient have difficulty concentrating, remembering, or making decisions?: No Patient able to express need for  assistance with ADLs?: Yes Does the patient have difficulty dressing or bathing?: No Independently performs ADLs?: Yes (appropriate for developmental age) Does the patient have difficulty walking or climbing stairs?: No Weakness of Legs: None Weakness of Arms/Hands: None  Permission Sought/Granted Permission sought to share information with : Case Manager                Emotional Assessment Appearance:: Appears stated age Attitude/Demeanor/Rapport: Engaged Affect (typically observed): Accepting Orientation: : Oriented to Self, Oriented to Place, Oriented to  Time, Oriented to Situation   Psych Involvement: No (comment)  Admission diagnosis:  SOB (shortness of breath) [R06.02] Elevated troponin [R79.89] Acute respiratory failure with hypoxia (HCC) [J96.01] Severe asthma with exacerbation, unspecified whether persistent [J45.901] Patient Active Problem List   Diagnosis Date Noted  . Acute asthma exacerbation 12/18/2018  . Elevated troponin 12/18/2018  . Acute bronchitis 08/30/2018  . Chronic respiratory failure with hypoxia ([place]) 04/06/2018  . Acute respiratory failure with hypoxia ([place]) 03/26/2018  . Essential hypertension 03/24/2018  . Depression 03/24/2018  . Asthma exacerbation 03/23/2018  . DOE (dyspnea on exertion) 03/21/2018  . 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 esophagitis 03/21/2018  . Chronic lung disease   . Hypoxia   . Dyspnea 01/23/2018  . ILD (interstitial lung disease) ([place]) 01/23/2018  . 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 Multiple pulmonary nodules/RA lung dz  05/29/2013  . Cough variant asthma vs uacs  05/29/2013  . Rheumatoid arthritis ([place]) 01/13/2012  . Osteopenia 01/13/2012  . Lichen sclerosus et atrophicus of the vulva 01/13/2012  .  Asthma 01/13/2012   PCP:  Shon Baton, MD Pharmacy:   [place], [place] [address] Phone: [phone]  Fax: [phone]     Social Determinants of Health (SDOH) Interventions    Readmission Risk Interventions No flowsheet data found.

## 2018-12-22 NOTE — Progress Notes (Signed)
PROGRESS NOTE  TOMMY MINICHIELLO BMW:[id number] DOB: 1940/09/13 DOA: 12/17/2018 PCP: Shon Baton, MD  Brief History   Patient is a 78 year old female with history of asthma, interstitial lung disease being followed by outpatient pulmonology, rheumatoid arthritis onRituxan, hypertension, mitral valve prolapse who presented the emergency room with complaints of shortness of breath. Patient was admitted for the management of exacerbation. She reports that she had 2-3 exacerbations in the last 6 months. She is on 2 L of oxygen at home as needed. Patient started on IV steroids, bronchodilators.Covid-19 test was negative.  Her hospital course was remarkable for gradual increment  in troponin most likely because of type 2 MI. Cardiology consulted.  Denies any chest pain.    Pulmonology consulted as per patient's request.  Consultants  . Pulmonology . Cardiology  Procedures  . None  Antibiotics  . None  Subjective  The patient is complaining of coughing fits at night. It is also noted that although the patient maintains saturations in the 98-90 percent range when she is not speaking, with even a little speech, she drops into the 80's, down to 80% even. She appears not to notice the change.  Objective   Vitals:  Vitals:   12/22/18 1357 12/22/18 1418  BP:  (!) 142/67  Pulse:  83  Resp:    Temp:  98.5 F (36.9 C)  SpO2: 97% 96%    Exam:  Constitutional:  . The patient is awake, alert, and oriented x 3. No acute distress. Respiratory:  . Positive for increased work of breathing especially with conversation. . Diminished breath sounds bilaterally. Spasms of cough with deep respirations. No wheezes, rales, or rhonchi.  . No tactile fremitus. Cardiovascular:  . Regular rate and rhythm . No murmurs, ectopy, or gallups are noted. . No lateral PMI. No thrills. . No LE extremity edema   . Normal pedal pulses Abdomen:  . Abdomen is soft, non-tender, non-distended. . No hernias,  masses, or organomegaly is appreciated. . Normoactive bowel sounds. Musculoskeletal:  . No cyanosis, clubbing or edema. Skin:  . No rashes, lesions, ulcers . palpation of skin: no induration or nodules Neurologic:  . CN 2-12 intact . Sensation all 4 extremities intact Psychiatric:  . Mental status o Mood, affect appropriate o Orientation to person, place, time  . judgment and insight appear intact   I have personally reviewed the following:   Today's Data  . Vitals  Scheduled Meds: . amitriptyline  25 mg Oral QHS  . arformoterol  15 mcg Nebulization BID  . aspirin EC  81 mg Oral Daily  . budesonide (PULMICORT) nebulizer solution  0.5 mg Nebulization BID  . enoxaparin (LOVENOX) injection  40 mg Subcutaneous Q24H  . gabapentin  600 mg Oral QHS  . guaiFENesin-dextromethorphan  10 mL Oral Q6H  . ipratropium-albuterol  3 mL Nebulization Q6H  . losartan  25 mg Oral Daily  . montelukast  10 mg Oral QHS  . pantoprazole  40 mg Oral BID  . pravastatin  20 mg Oral q1800  . predniSONE  10 mg Oral BID WC  . venlafaxine XR  75 mg Oral QHS   Continuous Infusions:  Principal Problem:   Acute asthma exacerbation Active Problems:   Rheumatoid arthritis ([place])   ILD (interstitial lung disease) ([place])   Essential hypertension   Elevated troponin   LOS: 3 days    A & P    Acute hypoxic failure secondary to asthma exacerbation: History of asthma.  Started on bronchodilators.  Continue steroids.  Respiratory status is gradually improving, although she is not at baseline oxygen requirements, especially with conversation.Chest x-ray on presentation did not show any focal consolidation. She also has H/O ILD.She follows with pulmonology, Dr. Chase Caller. Pulmonology consult called at the patient's request.  I appreciate their assistance.  Elevated troponin: Denies any chest pain.  EKG did not show any ischemic changes. Echocardiogram showed normal left ventricular function, normal ejection  fraction, impaired relaxation of left ventricle, no wall motion abnormalities. This is most likely due to supply demand ischemia.  She was started on heparin drip.  Troponin is trending down now. Cardiology was following, but have now signed off.  Started on aspirin.  Heparin drip discontinued.  She will follow-up with cardiology as an outpatient.  Hyperlipidemia: LDL of 137.  Started on pravastatin.  Seropositive rheumatoid arthritis: Followed by rheumatology at [facility].  Being treated with Rituxan.  Hypertension: Currently normotensive.  Continue PRN meds.  I have seen and examined this patient myself. I have spent 34 minutes in her evaluation and care.  DVT prophylaxis: IV heparin Code Status: Full Family Communication: None present at the bedside Disposition Plan: Home after improvement in the respiratory status  Ione Sand[place]ky, DO [facility] Direct contact: see [email or web address]  7PM-7AM contact night coverage as above  12/22/2018, 2:30 PM  LOS: 3 days

## 2018-12-23 MED ORDER — IPRATROPIUM-ALBUTEROL 0.5-2.5 (3) MG/3ML IN SOLN
3.0000 mL | Freq: Three times a day (TID) | RESPIRATORY_TRACT | Status: DC
  Administered 2018-12-23 – 2018-12-24 (×3): 3 mL via RESPIRATORY_TRACT
  Filled 2018-12-23 (×3): qty 3

## 2018-12-23 MED ORDER — DILTIAZEM HCL ER COATED BEADS 120 MG PO CP24
120.0000 mg | ORAL_CAPSULE | Freq: Every day | ORAL | Status: DC
  Administered 2018-12-23 – 2018-12-24 (×2): 120 mg via ORAL
  Filled 2018-12-23 (×2): qty 1

## 2018-12-23 MED ORDER — METOPROLOL TARTRATE 25 MG PO TABS
25.0000 mg | ORAL_TABLET | Freq: Two times a day (BID) | ORAL | Status: DC
  Administered 2018-12-23: 13:00:00 25 mg via ORAL
  Filled 2018-12-23: qty 1

## 2018-12-23 NOTE — Progress Notes (Signed)
pt ambulated 474ft  in hall on 2L O2. HR stayed in the 80's. O2 sat dropped to 82% at the lowest but mostly stayed at 92% sat during ambulation.

## 2018-12-23 NOTE — Progress Notes (Addendum)
PROGRESS NOTE  Stacey [name] RKY:[id number] DOB: 01/24/1941 DOA: 12/17/2018 PCP: Shon Baton, MD  Brief History   Patient is a 78 year old female with history of asthma, interstitial lung disease being followed by outpatient pulmonology, rheumatoid arthritis onRituxan, hypertension, mitral valve prolapse who presented the emergency room with complaints of shortness of breath. Patient was admitted for the management of exacerbation. She reports that she had 2-3 exacerbations in the last 6 months. She is on 2 L of oxygen at home as needed. Patient started on IV steroids, bronchodilators.Covid-19 test was negative.  Her hospital course was remarkable for gradual increment  in troponin most likely because of type 2 MI. Cardiology consulted.  Denies any chest pain.    Pulmonology consulted as per patient's request.  Pt was ambulated yesterday. SaO2 with ambulation was 84 and heart rate was 146.  Consultants  . Pulmonology . Cardiology  Procedures  . None  Antibiotics  . None  Subjective  The patient is without new complaints and states that she feels well.  Objective   Vitals:  Vitals:   12/23/18 1323 12/23/18 1344  BP:  (!) 157/71  Pulse:  63  Resp:  19  Temp:  (!) 97.5 F (36.4 C)  SpO2: 99% (!) 89%    Exam:  Constitutional:  . The patient is awake, alert, and oriented x 3. No acute distress. Respiratory:  . Positive for increased work of breathing especially with conversation. . Diminished breath sounds bilaterally.No wheezes, rales, or rhonchi.  . No tactile fremitus. Cardiovascular:  . Regular rate and rhythm . No murmurs, ectopy, or gallups are noted. . No lateral PMI. No thrills. . No LE extremity edema   . Normal pedal pulses Abdomen:  . Abdomen is soft, non-tender, non-distended. . No hernias, masses, or organomegaly is appreciated. . Normoactive bowel sounds. Musculoskeletal:  . No cyanosis, clubbing or edema. Skin:  . No rashes, lesions,  ulcers . palpation of skin: no induration or nodules Neurologic:  . CN 2-12 intact . Sensation all 4 extremities intact Psychiatric:  . Mental status o Mood, affect appropriate o Orientation to person, place, time  . judgment and insight appear intact   I have personally reviewed the following:   Today's Data  . Vitals  Scheduled Meds: . amitriptyline  25 mg Oral QHS  . arformoterol  15 mcg Nebulization BID  . aspirin EC  81 mg Oral Daily  . budesonide (PULMICORT) nebulizer solution  0.5 mg Nebulization BID  . enoxaparin (LOVENOX) injection  40 mg Subcutaneous Q24H  . gabapentin  600 mg Oral QHS  . guaiFENesin-dextromethorphan  10 mL Oral Q6H  . ipratropium-albuterol  3 mL Nebulization TID  . losartan  25 mg Oral Daily  . metoprolol tartrate  25 mg Oral BID  . montelukast  10 mg Oral QHS  . pantoprazole  40 mg Oral BID  . pravastatin  20 mg Oral q1800  . predniSONE  10 mg Oral BID WC  . venlafaxine XR  75 mg Oral QHS   Continuous Infusions:  Principal Problem:   Acute asthma exacerbation Active Problems:   Rheumatoid arthritis ([place])   ILD (interstitial lung disease) ([place])   Essential hypertension   Elevated troponin   LOS: 4 days    A & P    Acute hypoxic failure secondary to asthma exacerbation: History of asthma.  Started on bronchodilators.  Continue steroids.  Respiratory status is gradually improving, although she is not at baseline oxygen requirements, especially with conversation.Chest  x-ray on presentation did not show any focal consolidation. She also has H/O ILD.She follows with pulmonology, Dr. Chase Caller. Pulmonology consult called at the patient's request.  I appreciate their assistance. The patient's ambulatory Sa)2 yesterday was poor. Will try again today. I have added a beta blocker to help with heart rate.  I agree with Dr. Ander Slade, and will add anxiolytic.  Elevated troponin: Denies any chest pain.  EKG did not show any ischemic changes.  Echocardiogram showed normal left ventricular function, normal ejection fraction, impaired relaxation of left ventricle, no wall motion abnormalities. This is most likely due to supply demand ischemia.  She was started on heparin drip.  Troponin is trending down now. Cardiology was following, but have now signed off.  Started on aspirin.  Heparin drip discontinued.  She will follow-up with cardiology as an outpatient.  Hyperlipidemia: LDL of 137.  Started on pravastatin.  Seropositive rheumatoid arthritis: Followed by rheumatology at [facility].  Being treated with Rituxan.  Hypertension: Currently normotensive.  Continue PRN meds.  I have seen and examined this patient myself. I have spent 34 minutes in her evaluation and care.  DVT prophylaxis: IV heparin Code Status: Full Family Communication: None present at the bedside Disposition Plan: Home after improvement in the respiratory status  Adhira Jamil, DO [facility] Direct contact: see [email or web address]  7PM-7AM contact night coverage as above

## 2018-12-23 NOTE — Progress Notes (Signed)
NAME:  Stacey [name], MRN:  [id number], DOB:  [birth year], LOS: 4 ADMISSION DATE:  12/17/2018, CONSULTATION DATE:  12/21/2018 REFERRING MD:  Dr Tawanna Solo, CHIEF COMPLAINT: Shortness of breath  Brief History   78 yr old female presented to ER with shortness of breath, wheezing.  She had been on treated as outpt for asthma exacerbation, but failed to improve.  PCCM asked to assist with respiratory management.  History of present illness   78 yr old female presented to ER with dyspnea and wheezing after failed outpt tx for asthma exacerbation.  Treated with steroids, bronchodilators, and ABx.  Had elevated troponin and cardiology consulted.    Her symptoms started several days prior to admission.  She was getting cough, wheeze, and chest tightness.  Was bringing up small amounts of green sputum intermittently.  No sinus congestion, sore throat, fever, or hemoptysis.  No sick exposures, travel history, or environmental exposures.  She uses dulera at home.  Doesn't have spacer device.  Uses albuterol nebulizer when she has a flare.  Has rescue inhaler, but wasn't sure when to use it.  When she gets flares she feels she needs to be on prednisone 40 mg to get symptoms under control.  He is followed by Dr. Chase Caller for pulmonary and Dr. Eda Paschal for rheumatology at [facility].  She was started on rituxan earlier this year, and takes 4 mg prednisone alternating with 2 mg prednisone.  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, HTN, MVP, chronic hypoxic respiratory failure on 2 liters with exertion, CAD, HLD, Post herpetic neuralgia, Allergic rhinitis, Osteoporosis, GERD, MAI 2001   Significant Hospital Events   6/06 Admit 6/08 Cardiology consulted, start heparin gtt 6/10 d/c heparin gtt  Consults:  Cardiology 6/0 8 elevated troponin  Procedures:    Significant Diagnostic Tests:  CT angio chest 03/24/18 >> atherosclerosis, apical scarring,  subpleural reticular changes and septal thickening, decreased GGO RLL PFT 06/07/18 >> FEV1 1.25 (63%), FEV1 69%, DLCO 59% Echo 12/18/18 >> EF 60 to 65%  Micro Data:  COVID 6/06 >> negative  Antimicrobials:  Rocephin 6/06 >> 6/06 Zithromax 6/06 >> 6/06   Interim history/subjective:  Feels better Not coughing as much  Objective   Blood pressure (!) 142/69, pulse 65, temperature (!) 97.5 F (36.4 C), resp. rate 20, height 5\' 6"  (1.676 m), weight 64 kg, SpO2 98 %.        Intake/Output Summary (Last 24 hours) at 12/23/2018 1208 Last data filed at 12/23/2018 1154 Gross per 24 hour  Intake 320 ml  Output 3350 ml  Net -3030 ml   Filed Weights   12/18/18 0022 12/18/18 0318  Weight: 63.5 kg 64 kg    Examination: General: Elderly lady, comfortable, does not appear to be in distress HENT: Moist oral mucosa Lungs: Coarse breath sounds with rhonchi Cardiovascular: S1-S2 appreciated Abdomen: Soft, nontender, bowel sounds appreciated Alert and oriented, moving all extremities  Resolved Hospital Problem list     Assessment & Plan:  Acute asthma exacerbation -Steroids -Singulair -DuoNeb -Robitussin -Spacer to be used with Dulera as outpatient  Chronic hypoxemia -Likely related to interstitial lung disease -Evaluate for oxygen requirement, she does have oxygen at home that she uses intermittently  Right lung density on chest x-ray -Repeat chest x-ray is better, no significant abnormality  History of rheumatoid arthritis with ILD -To follow-up with rheumatology as outpatient -Follow-up with Dr. Chase Caller as outpatient   I did spend time talking with  patient and her daughter on the phone 6/11 Current concerns relating to coughing spasms, GERD, how to optimize management of a cough She may continue with cough medicines including Tessalon Perles and Delsym as needed  Optimize treatment for GERD-patient is supposed to be on Protonix twice a day  Anxiety-optimize  anti-anxiety medications, may consider adding low-dose benzodiazepines-risk versus benefits need to be assessed Effexor dose may also need to be increased  Stable for discharge from a pulmonary perspective    Best practice:  Diet: Heart healthy DVT prophylaxis: Lovenox Mobility: As tolerated Code Status: Full code Family Communication: Telemetry Disposition: Switch to p.o. medications as able Stable from a pulmonary perspective  Labs   CBC: Recent Labs  Lab 12/17/18 2354 12/18/18 0501 12/20/18 0254 12/21/18 0550  WBC 14.8* 11.2* 11.8* 12.0*  NEUTROABS 8.0*  --   --   --   HGB 13.9 12.3 13.0 12.4  HCT 44.1 40.6 41.3 41.5  MCV 95.0 96.0 94.9 98.1  PLT 349 345 321 782    Basic Metabolic Panel: Recent Labs  Lab 12/17/18 2354 12/20/18 0254  NA 139 139  K 4.2 3.7  CL 101 106  CO2 29 26  GLUCOSE 107* 133*  BUN 13 15  CREATININE 0.79 0.65  CALCIUM 8.9 8.4*   GFR: Estimated Creatinine Clearance: 54.3 mL/min (by C-G formula based on SCr of 0.65 mg/dL). Recent Labs  Lab 12/17/18 2354 12/18/18 0242 12/18/18 0501 12/20/18 0254 12/21/18 0550  PROCALCITON  --  <0.10  --   --   --   WBC 14.8*  --  11.2* 11.8* 12.0*    Liver Function Tests: No results for input(s): AST, ALT, ALKPHOS, BILITOT, PROT, ALBUMIN in the last 168 hours. No results for input(s): LIPASE, AMYLASE in the last 168 hours. No results for input(s): AMMONIA in the last 168 hours.  ABG    Component Value Date/Time   PHART 7.420 12/18/2018 0010   PCO2ART 46.1 12/18/2018 0010   PO2ART 78.0 (L) 12/18/2018 0010   HCO3 29.3 (H) 12/18/2018 0010   TCO2 31 12/18/2009 0347   O2SAT 95.4 12/18/2018 0010     Coagulation Profile: No results for input(s): INR, PROTIME in the last 168 hours.  Cardiac Enzymes: Recent Labs  Lab 12/18/18 2206 12/19/18 0341 12/19/18 1616 12/19/18 2204 12/20/18 0254  TROPONINI 2.10* 2.38* 2.42* 1.90* 1.17*    HbA1C: Hgb A1c MFr Bld  Date/Time Value Ref Range  Status  08/20/2015 11:10 AM 5.9 (H) 4.8 - 5.6 % Final    Comment:             Pre-diabetes: 5.7 - 6.4          Diabetes: >6.4          Glycemic control for adults with diabetes: <7.0     CBG: No results for input(s): GLUCAP in the last 168 hours.  Review of Systems:   Review of Systems  Constitutional: Negative.   Respiratory: Positive for cough and shortness of breath.      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   Surgical History    Past Surgical History:  Procedure Laterality Date  . CHOLECYSTECTOMY  2011  . [place] EXTRACTION       Social  History   reports that she has never smoked. She has never used smokeless tobacco. She reports that she does not drink alcohol or use drugs.   Family History   Her family history includes Anemia in her mother; Asthma in her mother; Breast cancer (age of onset: 49) in her maternal grandmother; COPD in her father; Polymyalgia rheumatica in her mother; Pulmonary fibrosis in her father.   Allergies Allergies  Allergen Reactions  . Penicillins Other (See Comments)    Reaction unknown occurred during childhood Has patient had a PCN reaction causing immediate rash, facial/tongue/throat swelling, SOB or lightheadedness with hypotension: Unknown Has patient had a PCN reaction causing severe rash involving mucus membranes or skin necrosis: Unknown Has patient had a PCN reaction that required hospitalization: No Has patient had a PCN reaction occurring within the last 10 years: No If all of the above answers are "NO", then may proceed with Cephalosporin use.  Unsur  . Remicade [Infliximab]  Other (See Comments)    Reaction unknown  . Sulfa Antibiotics Other (See Comments)    Reaction unknown  . Sulfamethoxazole Other (See Comments)    Unsure of reaction As a child not sure     Sherrilyn Rist, MD [place], PCCM Cell: [phone]

## 2018-12-24 MED ORDER — PREDNISONE 10 MG PO TABS: 10.0000 mg | ORAL_TABLET | Freq: Two times a day (BID) | ORAL | 0 refills | Status: AC

## 2018-12-24 MED ORDER — IPRATROPIUM-ALBUTEROL 0.5-2.5 (3) MG/3ML IN SOLN: 3.0000 mL | Freq: Three times a day (TID) | RESPIRATORY_TRACT | 0 refills | Status: DC

## 2018-12-24 MED ORDER — PRAVASTATIN SODIUM 20 MG PO TABS: 20.0000 mg | ORAL_TABLET | Freq: Every day | ORAL | 0 refills | Status: DC

## 2018-12-24 MED ORDER — PREDNISONE 10 MG PO TABS: 10.0000 mg | ORAL_TABLET | Freq: Every day | ORAL | 0 refills | Status: AC

## 2018-12-24 MED ORDER — BENZONATATE 200 MG PO CAPS: 200.0000 mg | ORAL_CAPSULE | Freq: Three times a day (TID) | ORAL | 0 refills | Status: DC | PRN

## 2018-12-24 MED ORDER — MONTELUKAST SODIUM 10 MG PO TABS: 10.0000 mg | ORAL_TABLET | Freq: Every day | ORAL | 0 refills | Status: DC

## 2018-12-24 MED ORDER — DILTIAZEM HCL ER COATED BEADS 120 MG PO CP24: 120.0000 mg | ORAL_CAPSULE | Freq: Every day | ORAL | 0 refills | Status: DC

## 2018-12-24 NOTE — Discharge Summary (Signed)
Physician Discharge Summary  Stacey [name] MCN:[id number] DOB: [birth year] DOA: 12/17/2018  PCP: Shon Baton, MD  Admit date: 12/17/2018 Discharge date: 12/24/2018  Recommendations for Outpatient Follow-up:  1. Pt is to follow up with PCP in 7-10 days. 2. Follow up with pulmonology in 2 weeks.  Follow-up Information    Buford Dresser, MD Follow up on 01/06/2019.   Specialty: Cardiology Why: 1:20 pm for in-office appt Contact information: [address] [place] [address] [phone]            Discharge Diagnoses: Principal diagnosis is #1 1. Acute on chronic hypoxic respiratory failure. 2. Asthma exacerbation 3. Seropositive RA 4. 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 5. Anxiety  Discharge Condition: Fair Disposition: Home  Diet recommendation: Regular diet  Filed Weights   12/18/18 0022 12/18/18 0318  Weight: 63.5 kg 64 kg    History of present illness:  Stacey [name] is a 78 y.o. female with medical history significant of asthma and interstitial lung disease followed by outpatient pulmonology, seropositive erosive rheumatoid arthritis, hypertension, mitral valve prolapse, and conditions listed below presenting to the hospital for evaluation of shortness of breath.  Per EMS, SPO2 90% on room air.  She was placed on a nonrebreather and went up to 100%.  She was given 125 mg of Solu-Medrol and 2 mg of IV magnesium.  Patient states she has asthma and interstitial lung disease.  Recently her breathing deteriorated and her pulmonologist's office prescribed a seven-day course of doxycycline and a 5-day course of prednisone.  She finished these medications a few days ago.  For the past 1 day she has been having increasing shortness of breath and wheezing.  She has not been coughing as much.  Around 7 PM last night she took a nebulizer treatment at home which seemed to help.  States she normally uses 2 L home oxygen PRN but has been needing it a lot  more for the past few days.  Denies any fevers, chills, chest pain, nausea, vomiting, abdominal pain, or diarrhea.  Denies history of CAD or prior MI.  Stated she wasfeeling better after receiving medications in the ED.  In the ED the patient was afebrile and tachypneic. She was wheezing and  had increased work of breathing.  RT attempted BiPAP but patient was not able to tolerate it.  ABG with pH 7.42, PCO2 46, and PO2 78.  She was placed back on 2 L supplemental oxygen and tolerated it well.  White count 14.8.  COVID-19 rapid test negative.  Troponin 0.11 >0.12.  EKG not suggestive of ACS.  Portable chest x-ray showing a linear radiopaque density over the left upper lung field, no focal consolidation.  Patient received albuterol, ceftriaxone, and azithromycin.  Hospital Course:  The hospitalists were consulted to admit the patient for further evaluation and treatment. Patient started on IV steroids, bronchodilators.Covid-19 test was negative.  Her hospital course was remarkable for gradual increment in troponin most likely because of type 2 MI. Cardiology consulted. Denies any chest pain.   Pulmonology consulted as per patient's request.  Pt was ambulated yesterday. SaO2 with ambulation was 84 and heart rate was 146. She was started on diltiazem. Today her oxygen saturation on 2 L was greatly improved as was her heart rate. She has been cleared for discharge by pulmonology.  Today's assessment: S: The patient is awake, alert, and oriented x 3. No acute distress. O: Vitals:  Vitals:   12/24/18 5681 12/24/18 1439  BP:    Pulse:    Resp:    Temp:    SpO2: 100% 98%   Constitutional:   The patient is awake, alert, and oriented x 3. No acute distress. Respiratory:   No increased work of breathing.  Diminished breath sounds bilaterally.No wheezes, rales, or rhonchi.   No tactile fremitus. Cardiovascular:   Regular rate and rhythm  No murmurs, ectopy, or gallups are  noted.  No lateral PMI. No thrills.  No LE extremity edema    Normal pedal pulses Abdomen:   Abdomen is soft, non-tender, non-distended.  No hernias, masses, or organomegaly is appreciated.  Normoactive bowel sounds. Musculoskeletal:   No cyanosis, clubbing or edema. Skin:   No rashes, lesions, ulcers  palpation of skin: no induration or nodules Neurologic:   CN 2-12 intact  Sensation all 4 extremities intact Psychiatric:   Mental status ? Mood, affect appropriate ? Orientation to person, place, time   judgment and insight appear intact    Discharge Instructions  Discharge Instructions    AMB Referral to [facility]   Complete by: As directed    Please refer to community RN for complex case management services.   Netta Cedars, MSN, RN [facility] Liaison Nurse Mobile Phone [phone]  Toll free office [phone]   Reason for consult: Post hospital follow up   Diagnoses of: Other   Other Diagnosis: Asthma   Expected date of contact: 1-3 days (reserved for hospital discharges)   Activity as tolerated - No restrictions   Complete by: As directed    Call MD for:  difficulty breathing, headache or visual disturbances   Complete by: As directed    Call MD for:  severe uncontrolled pain   Complete by: As directed    Diet - low sodium heart healthy   Complete by: As directed    Discharge instructions   Complete by: As directed    Activity as tolerated. Follow up with PCP in 7-10 days. Follow up with pulmonology in 1 month.   Increase activity slowly   Complete by: As directed      Allergies as of 12/24/2018      Reactions   Penicillins Other (See Comments)   Reaction unknown occurred during childhood Has patient had a PCN reaction causing immediate rash, facial/tongue/throat swelling, SOB or lightheadedness with hypotension: Unknown Has patient had a PCN reaction causing severe rash involving mucus membranes or skin necrosis:  Unknown Has patient had a PCN reaction that required hospitalization: No Has patient had a PCN reaction occurring within the last 10 years: No If all of the above answers are "NO", then may proceed with Cephalosporin use. Unsur   Remicade [infliximab] Other (See Comments)   Reaction unknown   Sulfa Antibiotics Other (See Comments)   Reaction unknown   Sulfamethoxazole Other (See Comments)   Unsure of reaction As a child not sure      Medication List    STOP taking these medications   doxycycline 100 MG tablet Commonly known as: VIBRA-TABS   methylPREDNISolone 4 MG tablet Commonly known as: MEDROL     TAKE these medications   albuterol (2.5 MG/3ML) 0.083% nebulizer solution Commonly known as: PROVENTIL Inhale 3 mLs (2.5 mg total) into the lungs every 4 (four) hours as needed for wheezing or shortness of breath. What changed: Another medication with the same name was removed. Continue taking this medication, and follow the directions you see here.   amitriptyline 25 MG tablet Commonly  known as: ELAVIL Take 25 mg by mouth at bedtime. Notes to patient: 12/24/18   benzonatate 200 MG capsule Commonly known as: TESSALON Take 1 capsule (200 mg total) by mouth 3 (three) times daily as needed for cough. Notes to patient: 12/24/18   beta carotene w/minerals tablet Take 1 tablet by mouth daily. Notes to patient: 12/24/18   cholecalciferol 1000 units tablet Commonly known as: VITAMIN D Take 1,000 Units by mouth daily. Notes to patient: 12/24/18   dextromethorphan 30 MG/5ML liquid Commonly known as: DELSYM Take 2.5 mLs (15 mg total) by mouth at bedtime. What changed:   when to take this  reasons to take this Notes to patient: 12/24/18   diltiazem 120 MG 24 hr capsule Commonly known as: CARDIZEM CD Take 1 capsule (120 mg total) by mouth daily for 30 days. Start taking on: December 25, 2018 Notes to patient: 12/25/18   gabapentin 300 MG capsule Commonly known as:  NEURONTIN Take 600 mg by mouth at bedtime. Notes to patient: 12/24/18   ipratropium-albuterol 0.5-2.5 (3) MG/3ML Soln Commonly known as: DUONEB Take 3 mLs by nebulization 3 (three) times daily. Notes to patient: 12/24/18, afternoon & evening   levalbuterol 45 MCG/ACT inhaler Commonly known as: XOPENEX HFA Inhale 2 puffs into the lungs every 4 (four) hours as needed for wheezing. Notes to patient: 12/24/18   losartan 25 MG tablet Commonly known as: COZAAR Take 25 mg by mouth daily. Notes to patient: 12/25/18   mometasone-formoterol 100-5 MCG/ACT Aero Commonly known as: DULERA Inhale 2 puffs into the lungs 2 (two) times daily. Notes to patient: 12/24/18   montelukast 10 MG tablet Commonly known as: SINGULAIR Take 1 tablet (10 mg total) by mouth at bedtime. Notes to patient: 12/24/18   pantoprazole 40 MG tablet Commonly known as: PROTONIX Take 1 tablet (40 mg total) by mouth 2 (two) times daily before a meal. What changed: when to take this Notes to patient: 12/24/18, evening   polyethylene glycol 17 g packet Commonly known as: MIRALAX / GLYCOLAX Take 17 g by mouth daily. What changed:   when to take this  reasons to take this Notes to patient: 12/24/18   pravastatin 20 MG tablet Commonly known as: PRAVACHOL Take 1 tablet (20 mg total) by mouth daily at 6 PM. Notes to patient: 12/24/18   predniSONE 10 MG tablet Commonly known as: DELTASONE Take 1 tablet (10 mg total) by mouth 2 (two) times daily with a meal for 1 day. What changed:   how much to take  how to take this  when to take this  additional instructions Notes to patient: 12/24/18, evening   predniSONE 10 MG tablet Commonly known as: DELTASONE Take 1 tablet (10 mg total) by mouth daily for 3 days. Start on 6/14 after completing 10mg  by mouth twice daily. Start taking on: December 25, 2018 What changed:   how much to take  how to take this  when to take this  additional instructions Notes to patient:  12/25/18   Rituxan 100 MG/10ML injection Generic drug: riTUXimab Inject into the vein. Receives at [facility] but unsure of dose - no more doses for next 6 months as of 09/14/18   venlafaxine XR 75 MG 24 hr capsule Commonly known as: EFFEXOR-XR Take 75 mg by mouth at bedtime. Notes to patient: 12/24/18      Allergies  Allergen Reactions   Penicillins Other (See Comments)    Reaction unknown occurred during childhood Has patient had a PCN reaction causing  immediate rash, facial/tongue/throat swelling, SOB or lightheadedness with hypotension: Unknown Has patient had a PCN reaction causing severe rash involving mucus membranes or skin necrosis: Unknown Has patient had a PCN reaction that required hospitalization: No Has patient had a PCN reaction occurring within the last 10 years: No If all of the above answers are "NO", then may proceed with Cephalosporin use.  Unsur   Remicade [Infliximab] Other (See Comments)    Reaction unknown   Sulfa Antibiotics Other (See Comments)    Reaction unknown   Sulfamethoxazole Other (See Comments)    Unsure of reaction As a child not sure     The results of significant diagnostics from this hospitalization (including imaging, microbiology, ancillary and laboratory) are listed below for reference.    Significant Diagnostic Studies: Dg Chest 2 View  Result Date: 12/22/2018 CLINICAL DATA:  Shortness of breath. Follow-up left lung atelectasis. EXAM: CHEST - 2 VIEW COMPARISON:  12/17/2018 and 09/06/2017 FINDINGS: Lungs are adequately inflated with minimal chronic stable prominence of the infrahilar vessels. No lobar consolidation or effusion. Stable lingular scarring. Cardiomediastinal silhouette and remainder of the exam is unchanged. IMPRESSION: No acute cardiopulmonary disease. Electronically Signed   By: Marin Olp M.D.   On: 12/22/2018 10:49   Dg Chest Portable 1 View  Result Date: 12/18/2018 CLINICAL DATA:  78 year old female with shortness of  breath. EXAM: PORTABLE CHEST 1 VIEW COMPARISON:  Chest CT dated 03/24/2018 FINDINGS: There is background of emphysema. No focal consolidation, pleural effusion, or pneumothorax. The cardiac silhouette is within normal limits. A linear density over the left upper lobe and left apical region extending to the region of the left hilum noted which may be superimposed on the patient. Dedicated PA and lateral views recommended for further evaluation. IMPRESSION: 1. No acute cardiopulmonary process. 2. Linear radiopaque density over the left upper lung field. Dedicated PA and lateral views of the chest recommended for better evaluation. Electronically Signed   By: Anner Crete M.D.   On: 12/18/2018 00:20    Microbiology: Recent Results (from the past 240 hour(s))  SARS Coronavirus 2 (CEPHEID- Performed in [place] hospital lab), Hosp Order     Status: None   Collection Time: 12/17/18 11:42 PM   Specimen: Nasopharyngeal Swab  Result Value Ref Range Status   SARS Coronavirus 2 NEGATIVE NEGATIVE Final    Comment: (NOTE) If result is NEGATIVE SARS-CoV-2 target nucleic acids are NOT DETECTED. The SARS-CoV-2 RNA is generally detectable in upper and lower  respiratory specimens during the acute phase of infection. The lowest  concentration of SARS-CoV-2 viral copies this assay can detect is 250  copies / mL. A negative result does not preclude SARS-CoV-2 infection  and should not be used as the sole basis for treatment or other  patient management decisions.  A negative result may occur with  improper specimen collection / handling, submission of specimen other  than nasopharyngeal swab, presence of viral mutation(s) within the  areas targeted by this assay, and inadequate number of viral copies  (<250 copies / mL). A negative result must be combined with clinical  observations, patient history, and epidemiological information. If result is POSITIVE SARS-CoV-2 target nucleic acids are DETECTED. The  SARS-CoV-2 RNA is generally detectable in upper and lower  respiratory specimens dur ing the acute phase of infection.  Positive  results are indicative of active infection with SARS-CoV-2.  Clinical  correlation with patient history and other diagnostic information is  necessary to determine patient infection status.  Positive results do  not rule out bacterial infection or co-infection with other viruses. If result is PRESUMPTIVE POSTIVE SARS-CoV-2 nucleic acids MAY BE PRESENT.   A presumptive positive result was obtained on the submitted specimen  and confirmed on repeat testing.  While 2019 novel coronavirus  (SARS-CoV-2) nucleic acids may be present in the submitted sample  additional confirmatory testing may be necessary for epidemiological  and / or clinical management purposes  to differentiate between  SARS-CoV-2 and other Sarbecovirus currently known to infect humans.  If clinically indicated additional testing with an alternate test  methodology [phone]) is advised. The SARS-CoV-2 RNA is generally  detectable in upper and lower respiratory sp ecimens during the acute  phase of infection. The expected result is Negative. Fact Sheet for Patients:  StrictlyIdeas.no Fact Sheet for Healthcare Providers: BankingDealers.co.za This test is not yet approved or cleared by the [place] FDA and has been authorized for detection and/or diagnosis of SARS-CoV-2 by FDA under an Emergency Use Authorization (EUA).  This EUA will remain in effect (meaning this test can be used) for the duration of the COVID-19 declaration under Section 564(b)(1) of the Act, 21 U.S.C. section 360bbb-3(b)(1), unless the authorization is terminated or revoked sooner. Performed at [facility], [place] [address], [place], [place] [address]      Labs: Basic Metabolic Panel: Recent Labs  Lab 12/17/18 2354 12/20/18 0254  NA 139 139  K 4.2  3.7  CL 101 106  CO2 29 26  GLUCOSE 107* 133*  BUN 13 15  CREATININE 0.79 0.65  CALCIUM 8.9 8.4*   Liver Function Tests: No results for input(s): AST, ALT, ALKPHOS, BILITOT, PROT, ALBUMIN in the last 168 hours. No results for input(s): LIPASE, AMYLASE in the last 168 hours. No results for input(s): AMMONIA in the last 168 hours. CBC: Recent Labs  Lab 12/17/18 2354 12/18/18 0501 12/20/18 0254 12/21/18 0550  WBC 14.8* 11.2* 11.8* 12.0*  NEUTROABS 8.0*  --   --   --   HGB 13.9 12.3 13.0 12.4  HCT 44.1 40.6 41.3 41.5  MCV 95.0 96.0 94.9 98.1  PLT 349 345 321 284   Cardiac Enzymes: Recent Labs  Lab 12/18/18 2206 12/19/18 0341 12/19/18 1616 12/19/18 2204 12/20/18 0254  TROPONINI 2.10* 2.38* 2.42* 1.90* 1.17*   BNP: BNP (last 3 results) Recent Labs    01/22/18 2357 01/23/18 1300 03/23/18 0732  BNP 100.6* 116.2* 89.4    ProBNP (last 3 results) Recent Labs    03/21/18 1119  PROBNP 38.0    CBG: No results for input(s): GLUCAP in the last 168 hours.  Principal Problem:   Acute asthma exacerbation Active Problems:   Rheumatoid arthritis ([place])   ILD (interstitial lung disease) ([place])   Essential hypertension   Elevated troponin   Time coordinating discharge: 38 minutes.  Signed:        Braelynne Garinger, DO [facility]  12/24/2018, 6:21 PM

## 2018-12-24 NOTE — Progress Notes (Signed)
Discharge information given to and discussed with patient. Patient expresses lots of concerns and has questions regarding different respiratory medications. This nurse consulted pharmacy, who then contacted the MD for clarification and further instructions. This nurse informed the patient to follow up with pulmonology post discharge, per MD orders.

## 2018-12-24 NOTE — TOC Transition Note (Signed)
Transition of Care [facility]) - CM/SW Discharge Note   Patient Details  Name: Stacey [name] MRN: [id number] Date of Birth: 1940-09-05  [facility]) CM/SW Contact:  Joaquin Courts, RN Phone Number: 12/24/2018, 2:11 PM   Clinical Narrative:   Patient has a portable concentrator at bedside for discharge home today.       Barriers to Discharge: Continued Medical Work up   Patient Goals and CMS Choice Patient states their goals for this hospitalization and ongoing recovery are:: To get better and go home.      Discharge Placement                       Discharge Plan and Services In-house Referral: [facility] Discharge Planning Services: CM Consult            DME Arranged: N/A DME Agency: NA       HH Arranged: NA HH Agency: NA        Social Determinants of Health (SDOH) Interventions     Readmission Risk Interventions No flowsheet data found.

## 2018-12-26 ENCOUNTER — Other Ambulatory Visit: Payer: Self-pay | Admitting: *Deleted

## 2018-12-26 NOTE — Patient Outreach (Signed)
[facility]) Care Management  12/26/2018  Stacey [name] 1941-02-12 [id number]    Referral received 12/22/2018 Initial Outreach 12/26/2018 Declined [facility] services  Transition care post d/c 12/24/2018  RN spoke with pt today and confirmed identifiers. RN explained the purpose for today's call and [facility] services as part of her benefits (pt receptive). RN further discussed her recent discharged from the hospital. Pt reports having her primary provider virtual telephone visit today and pending office visits with her pulmonologist and CAD provider next week. Completed the transition of care template and review all medications. Further inquired on her ongoing management of care and offered further assistance via [facility] services. Pt declined indicating at this time she was doing very well in managing her care. Offered to follow up weekly, quarterly or as frequently as she needs however pt again declined however grateful but feels she does not need the follow up calls. Pt receptive to receiving [facility] information letter in the mail and has this RN case manager's contact number if she is in needed of services at a later date.  RN will notify pt's provider of pt's disposition with [facility] and close case from further inquires at this time.  Raina Mina, RN Care Management Coordinator [place] Office [phone]

## 2018-12-27 ENCOUNTER — Other Ambulatory Visit: Payer: Self-pay

## 2018-12-27 NOTE — Patient Outreach (Signed)
[facility]) Care Management  12/27/2018  Stacey [name] [birth year] [id number]    EMMI-General Discharge RED ON EMMI ALERT Day # 1 Date: 12/26/2018 Red Alert Reason: "Unfilled prescriptions? Yes"   Outreach attempt # 1 to patient. Spoke with patient who denies any acute issues or concerns. Reviewed and addressed red alert. Patient states that she has all her meds in the home. She denies any issues or concerns regarding them. RN CM asked multiple times to ensure and patient reported everything was okay with meds. She reports she has follow up appt with PCP and pulmonologist on next week. She denies any further RN CM needs or concerns at this time. Advised patient that they would get one more automated EMMI-GENERAL post discharge calls to assess how they are doing following recent hospitalization and will receive a call from a nurse if any of their responses were abnormal. Patient voiced understanding and was appreciative of f/u call.       Plan: RN CM will close case as no further interventions needed at this time.  Enzo Montgomery, RN,BSN,CCM [facility] Telephonic Care Management Coordinator Direct Phone: [phone] Toll Free: [phone] Fax: [phone]

## 2019-01-02 ENCOUNTER — Telehealth: Payer: Self-pay

## 2019-01-02 ENCOUNTER — Encounter: Payer: Self-pay | Admitting: Nurse Practitioner

## 2019-01-02 ENCOUNTER — Other Ambulatory Visit: Payer: Self-pay

## 2019-01-02 ENCOUNTER — Ambulatory Visit (INDEPENDENT_AMBULATORY_CARE_PROVIDER_SITE_OTHER): Payer: Medicare Other | Admitting: Nurse Practitioner

## 2019-01-02 ENCOUNTER — Inpatient Hospital Stay: Payer: Medicare Other | Admitting: Pulmonary Disease

## 2019-01-02 DIAGNOSIS — J45901 Unspecified asthma with (acute) exacerbation: Secondary | ICD-10-CM | POA: Diagnosis not present

## 2019-01-02 MED ORDER — MOMETASONE FURO-FORMOTEROL FUM 100-5 MCG/ACT IN AERO: 2.0000 | INHALATION_SPRAY | Freq: Two times a day (BID) | RESPIRATORY_TRACT | 0 refills | Status: DC

## 2019-01-02 MED ORDER — ALBUTEROL SULFATE HFA 108 (90 BASE) MCG/ACT IN AERS: 2.0000 | INHALATION_SPRAY | Freq: Four times a day (QID) | RESPIRATORY_TRACT | 0 refills | Status: DC | PRN

## 2019-01-02 MED ORDER — BENZONATATE 200 MG PO CAPS: 200.0000 mg | ORAL_CAPSULE | Freq: Three times a day (TID) | ORAL | 0 refills | Status: DC | PRN

## 2019-01-02 MED ORDER — MONTELUKAST SODIUM 10 MG PO TABS: 10.0000 mg | ORAL_TABLET | Freq: Every day | ORAL | 0 refills | Status: DC

## 2019-01-02 NOTE — Patient Instructions (Addendum)
Continue Singulair daily  Continue Dulera >>> 2 puffs in the morning right when you wake up, rinse out your mouth after use, 12 hours later 2 puffs, rinse after use >>> Take this daily, no matter what >>> This is not a rescue inhaler   Only use your albuterol as a rescue medication to be used if you can't catch your breath by resting or doing a relaxed purse lip breathing pattern.  - The less you use it, the better it will work when you need it. - Ok to use up to 2 puffs every 4 hours if you must but call for immediate appointment if use goes up over your usual need - Don't leave home without it !! (think of it like the spare tire for your car)   May use Duoneb if needed for wheezing or shortness of breath  May use tessalon perles as needed for cough   Chronic Hypoxemia Keep close check on O2 sats - My use O2 at 2L  as needed to keep sats above 90% with exertion   Continue follow-up with [facility]ology  Continue Rituxan infusions   Schedule a follow-up spirometry with [facility] prior to your office visit with Dr. Chase Caller in 2-3 months Schedule a follow-up visit with Dr. Chase Caller in 2-3 months in [facility] clinic

## 2019-01-02 NOTE — Telephone Encounter (Signed)
    Pt made aware to wear a mask and visitors policy. Pt voiced understanding.  COVID-19 Pre-Screening Questions:  . In the past 7 to 10 days have you had a cough,  shortness of breath, headache, congestion, fever (100 or greater) body aches, chills, sore throat, or sudden loss of taste or sense of smell? NO . Have you been around anyone with known Covid 19.NO . Have you been around anyone who is awaiting Covid 19 test results in the past 7 to 10 days?NO . Have you been around anyone who has been exposed to Covid 19, or has mentioned symptoms of Covid 19 within the past 7 to 10 days?NO  If you have any concerns/questions about symptoms patients report during screening (either on the phone or at threshold). Contact the provider seeing the patient or DOD for further guidance.  If neither are available contact a member of the leadership team.

## 2019-01-02 NOTE — Assessment & Plan Note (Signed)
She was admitted to the hospital on 12/17/2018 and discharged on 12/24/2018 for acute on chronic respiratory failure and asthma exacerbation.  Patient was treated with IV steroids, bronchodilators and IV antibiotics.  Patient has been doing well since hospital discharge.  Patient was discharged home with oxygen but states that her O2 sats have been stable in the upper 90s states she has not used the oxygen.  Patient states that her cough has resolved, but patient states that she would like a refill on tessalon perles.  Patient has been compliant with Dulera.  She was discharged home with duo nebs and has been taking the scheduled instead of PRN.  Patient did need some help understanding when to take her medications.  We went through her med list and clarified this.  Asthma exacerbation: Resolved  Plan: Continue Singulair daily Continue DuoNeb as needed Continue Dulera Continue albuterol as needed We will refill Tessalon Perles to have for cough as needed  Chronic hypoxemia: O2 sats have been stable since hospital discharge  Plan: Keep close watch on O2 sats and may use 2 L of O2 as needed to keep sats above 90% with exertion  History of rheumatoid arthritis with ILD Continue close follow-up with rheumatology  Patient Instructions  Continue Singulair daily  Continue Dulera >>> 2 puffs in the morning right when you wake up, rinse out your mouth after use, 12 hours later 2 puffs, rinse after use >>> Take this daily, no matter what >>> This is not a rescue inhaler   Only use your albuterol as a rescue medication to be used if you can't catch your breath by resting or doing a relaxed purse lip breathing pattern.  - The less you use it, the better it will work when you need it. - Ok to use up to 2 puffs every 4 hours if you must but call for immediate appointment if use goes up over your usual need - Don't leave home without it !! (think of it like the spare tire for your car)   May use  Duoneb if needed for wheezing or shortness of breath  May use tessalon perles as needed for cough   Chronic Hypoxemia Keep close check on O2 sats - My use O2 at 2L [place] as needed to keep sats above 90% with exertion   Continue follow-up with [facility]ology  Continue Rituxan infusions   Schedule a follow-up spirometry with [facility] prior to your office visit with Dr. Chase Caller in 2-3 months Schedule a follow-up visit with Dr. Chase Caller in 2-3 months in ILD clinic

## 2019-01-02 NOTE — Progress Notes (Signed)
@Patient  ID: Stacey [name], female    DOB: 07-21-1940, 78 y.o.   MRN: [id number]  Chief Complaint  Patient presents with  . 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 Chase Caller  Tests:  CXR 12/22/18- No acute cardiopulmonary disease.  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13  FVC-Predicted Pre % 77 68 75 65 71 71 68  FVC-Post L - - - 1.99 2.17 2.24 2.24  FVC-Predicted Post % - - - 67 72 74 71  Pre FEV1/FVC  % % 79 69 75 77 77 76 81  Post FEV1/FCV % % - - - 78 78 77 82  FEV1-Pre L 1.72 1.25 1.50 1.48 1.66 1.65 1.73  FEV1-Predicted Pre % 81 63 76 67 74 72 73  FEV1-Post L - - - 1.55 1.69 1.73 1.85  DLCO UNC% % - 59 70 59 61 67 69  DLCO COR %Predicted % - 80 97 81 74 89 85  TLC L - - - 4.40 5.11 5.21 4.85  TLC % Predicted % - - - 82 95 97 90  RV % Predicted % - - - 102 131 133 107    OV 01/02/19 - Hospital follow up Patient presents today for hospital follow-up.  She was admitted to the hospital on 12/17/2018 and discharged on 12/24/2018 for acute on chronic respiratory failure and asthma exacerbation.  Patient was treated with IV steroids, bronchodilators and IV antibiotics.  Patient has been doing well since hospital discharge.  Patient was discharged home with oxygen but states that her O2 sats have been stable in the upper 90s states she has not used the oxygen.  Patient states that her cough has resolved, but patient states that she would like a refill on tessalon perles.  Patient has been compliant with Dulera.  She was discharged home with duo nebs and has been taking the scheduled instead of PRN.  Patient is active and can perform ADLs independently. Denies f/c/s, n/v/d, hemoptysis, PND, leg swelling.     Allergies  Allergen Reactions  . Penicillins Other (See Comments)    Reaction unknown occurred during childhood Has patient had a PCN reaction causing immediate rash, facial/tongue/throat swelling, SOB or lightheadedness with hypotension: Unknown Has patient had a PCN reaction causing severe rash involving mucus membranes or skin necrosis: Unknown Has patient had a PCN reaction that  required hospitalization: No Has patient had a PCN reaction occurring within the last 10 years: No If all of the above answers are "NO", then may proceed with Cephalosporin use.  Unsur  . Remicade [Infliximab] Other (See Comments)    Reaction unknown  . Sulfa Antibiotics Other (See Comments)    Reaction  unknown  . Sulfamethoxazole Other (See Comments)    Unsure of reaction As a child not sure     Immunization History  Administered Date(s) Administered  . Influenza Split 04/12/2013, 04/12/2014, 03/14/2015, 04/23/2016  . Influenza, High Dose Seasonal PF 04/12/2017, 03/24/2018  . Influenza-Unspecified 04/03/2013, 04/22/2017  . Pneumococcal Conjugate-13 04/03/2013, 09/28/2014  . Pneumococcal Polysaccharide-23 07/13/2012, 07/24/2013  . Tdap 12/10/2017    Past Medical History:  Diagnosis Date  . Abnormal finding of blood chemistry   . Asthma   . H/O measles   . H/O varicella   . Hypertension   . 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[place])   . 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 Lichen sclerosus 57/26/20   Asymptomatic  . Low iron   . Mitral valve prolapse   . Osteoarthritis   . Osteoporosis   . Pneumonia   . Post herpetic neuralgia   . Rheumatoid arthritis(714.0)   . Yeast infection     Tobacco History: Social History   Tobacco Use  Smoking Status Never Smoker  Smokeless Tobacco Never Used   Counseling given: Yes   Outpatient Encounter Medications as of 01/02/2019  Medication Sig  . amitriptyline (ELAVIL) 25 MG tablet Take 25 mg by mouth at bedtime.  . benzonatate (TESSALON) 200 MG capsule Take 1 capsule (200 mg total) by mouth 3 (three) times daily as needed for cough.  . beta carotene w/minerals (OCUVITE) tablet Take 1 tablet by mouth daily.  . cholecalciferol (VITAMIN D) 1000 UNITS tablet Take 1,000 Units by mouth daily.  Marland Kitchen dextromethorphan (DELSYM) 30 MG/5ML liquid Take 2.5 mLs (15 mg total) by mouth at bedtime. (Patient taking differently: Take 15 mg by mouth as needed for cough. )  . diltiazem (CARDIZEM CD) 120 MG 24 hr capsule Take 1 capsule (120 mg total) by mouth daily for 30 days.  Marland Kitchen gabapentin (NEURONTIN) 300 MG capsule Take 600 mg by mouth at bedtime.  Marland Kitchen ipratropium-albuterol (DUONEB) 0.5-2.5 (3) MG/3ML SOLN Take 3 mLs by nebulization 3 (three) times daily.  Marland Kitchen  losartan (COZAAR) 25 MG tablet Take 25 mg by mouth daily.  . mometasone-formoterol (DULERA) 100-5 MCG/ACT AERO Inhale 2 puffs into the lungs 2 (two) times daily.  . montelukast (SINGULAIR) 10 MG tablet Take 1 tablet (10 mg total) by mouth at bedtime.  . pantoprazole (PROTONIX) 40 MG tablet Take 1 tablet (40 mg total) by mouth 2 (two) times daily before a meal. (Patient taking differently: Take 40 mg by mouth daily. )  . polyethylene glycol (MIRALAX / GLYCOLAX) packet Take 17 g by mouth daily. (Patient taking differently: Take 17 g by mouth daily as needed (For constipation.). )  . pravastatin (PRAVACHOL) 20 MG tablet Take 1 tablet (20 mg total) by mouth daily at 6 PM.  . riTUXimab (RITUXAN) 100 MG/10ML injection Inject into the vein. Receives at [facility] but unsure of dose - no more doses for next 6 months as of 09/14/18  . venlafaxine XR (EFFEXOR-XR) 75 MG 24 hr capsule Take 75 mg by mouth at bedtime.   . [DISCONTINUED] albuterol (PROVENTIL) (2.5 MG/3ML) 0.083% nebulizer solution Inhale 3 mLs (2.5 mg total) into the lungs every 4 (four) hours as needed  for wheezing or shortness of breath.  . [DISCONTINUED] benzonatate (TESSALON) 200 MG capsule Take 1 capsule (200 mg total) by mouth 3 (three) times daily as needed for cough.  . [DISCONTINUED] levalbuterol (XOPENEX HFA) 45 MCG/ACT inhaler Inhale 2 puffs into the lungs every 4 (four) hours as needed for wheezing.  . [DISCONTINUED] mometasone-formoterol (DULERA) 100-5 MCG/ACT AERO Inhale 2 puffs into the lungs 2 (two) times daily.  . [DISCONTINUED] montelukast (SINGULAIR) 10 MG tablet Take 1 tablet (10 mg total) by mouth at bedtime.  Marland Kitchen albuterol (VENTOLIN HFA) 108 (90 Base) MCG/ACT inhaler Inhale 2 puffs into the lungs every 6 (six) hours as needed for wheezing or shortness of breath.   No facility-administered encounter medications on file as of 01/02/2019.      Review of Systems  Review of Systems  Constitutional: Negative.   HENT: Negative.    Respiratory: Negative for cough, shortness of breath and wheezing.   Cardiovascular: Negative.  Negative for chest pain, palpitations and leg swelling.  Gastrointestinal: Negative.   Allergic/Immunologic: Negative.   Neurological: Negative.   Psychiatric/Behavioral: Negative.        Physical Exam  BP 118/62 (BP Location: Left Arm, Patient Position: Sitting, Cuff Size: Normal)   Pulse 81   Temp 97.7 F (36.5 C)   Ht 5\' 6"  (1.676 m)   Wt 139 lb 6.4 oz (63.2 kg)   SpO2 96%   BMI 22.50 kg/m   Wt Readings from Last 5 Encounters:  01/02/19 139 lb 6.4 oz (63.2 kg)  12/18/18 141 lb 1.5 oz (64 kg)  09/14/18 143 lb (64.9 kg)  08/30/18 143 lb (64.9 kg)  07/21/18 140 lb (63.5 kg)     Physical Exam Vitals signs and nursing note reviewed.  Constitutional:      General: She is not in acute distress.    Appearance: She is well-developed.  Cardiovascular:     Rate and Rhythm: Normal rate and regular rhythm.  Pulmonary:     Effort: Pulmonary effort is normal. No respiratory distress.     Breath sounds: Normal breath sounds. No wheezing or rhonchi.  Musculoskeletal:        General: No swelling.  Neurological:     Mental Status: She is alert and oriented to person, place, and time.      Imaging: Dg Chest 2 View  Result Date: 12/22/2018 CLINICAL DATA:  Shortness of breath. Follow-up left lung atelectasis. EXAM: CHEST - 2 VIEW COMPARISON:  12/17/2018 and 09/06/2017 FINDINGS: Lungs are adequately inflated with minimal chronic stable prominence of the infrahilar vessels. No lobar consolidation or effusion. Stable lingular scarring. Cardiomediastinal silhouette and remainder of the exam is unchanged. IMPRESSION: No acute cardiopulmonary disease. Electronically Signed   By: Marin Olp M.D.   On: 12/22/2018 10:49   Dg Chest Portable 1 View  Result Date: 12/18/2018 CLINICAL DATA:  78 year old female with shortness of breath. EXAM: PORTABLE CHEST 1 VIEW COMPARISON:  Chest CT dated  03/24/2018 FINDINGS: There is background of emphysema. No focal consolidation, pleural effusion, or pneumothorax. The cardiac silhouette is within normal limits. A linear density over the left upper lobe and left apical region extending to the region of the left hilum noted which may be superimposed on the patient. Dedicated PA and lateral views recommended for further evaluation. IMPRESSION: 1. No acute cardiopulmonary process. 2. Linear radiopaque density over the left upper lung field. Dedicated PA and lateral views of the chest recommended for better evaluation. Electronically Signed   By: Anner Crete  M.D.   On: 12/18/2018 00:20     Assessment & Plan:   Asthma exacerbation She was admitted to the hospital on 12/17/2018 and discharged on 12/24/2018 for acute on chronic respiratory failure and asthma exacerbation.  Patient was treated with IV steroids, bronchodilators and IV antibiotics.  Patient has been doing well since hospital discharge.  Patient was discharged home with oxygen but states that her O2 sats have been stable in the upper 90s states she has not used the oxygen.  Patient states that her cough has resolved, but patient states that she would like a refill on tessalon perles.  Patient has been compliant with Dulera.  She was discharged home with duo nebs and has been taking the scheduled instead of PRN.  Patient did need some help understanding when to take her medications.  We went through her med list and clarified this.  Asthma exacerbation: Resolved  Plan: Continue Singulair daily Continue DuoNeb as needed Continue Dulera Continue albuterol as needed We will refill Tessalon Perles to have for cough as needed  Chronic hypoxemia: O2 sats have been stable since hospital discharge  Plan: Keep close watch on O2 sats and may use 2 L of O2 as needed to keep sats above 90% with exertion  History of rheumatoid arthritis with ILD Continue close follow-up with rheumatology   Patient Instructions  Continue Singulair daily  Continue Dulera >>> 2 puffs in the morning right when you wake up, rinse out your mouth after use, 12 hours later 2 puffs, rinse after use >>> Take this daily, no matter what >>> This is not a rescue inhaler   Only use your albuterol as a rescue medication to be used if you can't catch your breath by resting or doing a relaxed purse lip breathing pattern.  - The less you use it, the better it will work when you need it. - Ok to use up to 2 puffs every 4 hours if you must but call for immediate appointment if use goes up over your usual need - Don't leave home without it !! (think of it like the spare tire for your car)   May use Duoneb if needed for wheezing or shortness of breath  May use tessalon perles as needed for cough   Chronic Hypoxemia Keep close check on O2 sats - My use O2 at 2L [place] as needed to keep sats above 90% with exertion   Continue follow-up with [facility]  Continue Rituxan infusions   Schedule a follow-up spirometry with [facility] prior to your office visit with Dr. Chase Caller in 2-3 months Schedule a follow-up visit with Dr. Chase Caller in 2-3 months in ILD clinic       Fenton Foy, NP 01/02/2019

## 2019-01-05 ENCOUNTER — Telehealth: Payer: Self-pay | Admitting: Cardiology

## 2019-01-05 NOTE — Telephone Encounter (Signed)
I called pt to confirm her 01-06-19 appt with Dr Harrell Gave,

## 2019-01-06 ENCOUNTER — Other Ambulatory Visit: Payer: Self-pay

## 2019-01-06 ENCOUNTER — Ambulatory Visit (INDEPENDENT_AMBULATORY_CARE_PROVIDER_SITE_OTHER): Payer: Medicare Other | Admitting: Cardiology

## 2019-01-06 ENCOUNTER — Encounter: Payer: Self-pay | Admitting: Cardiology

## 2019-01-06 VITALS — BP 128/70 | HR 80 | Temp 97.9°F | Ht 66.0 in | Wt 139.0 lb

## 2019-01-06 DIAGNOSIS — IMO0001 Reserved for inherently not codable concepts without codable children: Secondary | ICD-10-CM

## 2019-01-06 DIAGNOSIS — I10 Essential (primary) hypertension: Secondary | ICD-10-CM

## 2019-01-06 DIAGNOSIS — I2510 Atherosclerotic heart disease of native coronary artery without angina pectoris: Secondary | ICD-10-CM | POA: Diagnosis not present

## 2019-01-06 DIAGNOSIS — Z9189 Other specified personal risk factors, not elsewhere classified: Secondary | ICD-10-CM | POA: Diagnosis not present

## 2019-01-06 DIAGNOSIS — E7800 Pure hypercholesterolemia, unspecified: Secondary | ICD-10-CM

## 2019-01-06 DIAGNOSIS — Z7189 Other specified counseling: Secondary | ICD-10-CM | POA: Diagnosis not present

## 2019-01-06 MED ORDER — PRAVASTATIN SODIUM 20 MG PO TABS: 20.0000 mg | ORAL_TABLET | Freq: Every evening | ORAL | 3 refills | Status: DC

## 2019-01-06 NOTE — Patient Instructions (Signed)
Medication Instructions:  Stop: Diltiazem 120 mg   If you need a refill on your cardiac medications before your next appointment, please call your pharmacy.   Lab work: None  Testing/Procedures: None  Follow-Up: At [facility], you and your health needs are our priority.  As part of our continuing mission to provide you with exceptional heart care, we have created designated Provider Care Teams.  These Care Teams include your primary Cardiologist (physician) and Advanced Practice Providers (APPs -  Physician Assistants and Nurse Practitioners) who all work together to provide you with the care you need, when you need it. You will need a follow up appointment in 1 years.  Please call our office 2 months in advance to schedule this appointment.  You may see Buford Dresser, MD or one of the following Advanced Practice Providers on your designated Care Team:   Rosaria Ferries, PA-C . Jory Sims, DNP, ANP

## 2019-01-06 NOTE — Progress Notes (Signed)
Cardiology Office Note:    Date:  01/06/2019   ID:  Stacey [name] 28-Apr-1941, MRN [id number]  PCP:  Shon Baton, MD  Cardiologist:  Buford Dresser, MD PhD  Referring MD: Shon Baton, MD   CC: transition of care 14 day post hospitalization follow up  History of Present Illness:    Stacey [name] is a 78 y.o. 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 I met her during her recent hospitalization in 12/2018. Her discharge summary is reviewed as a part of this visit, and a complete medication reconciliation is completed. She was admitted for shortness of breath and found to have elevated troponins.  We had an extensive conversation during her admission with shared decision making regarding the elevation of her troponin. She preferred medical management over an invasive strategy. She had a rise and fall of troponins, with peak of 2.38 (old assay), but no chest pain. Echo without wall motion abnormalities. She received >48 hours of heparin and was started on aspirin and statin. Coronary calcium was seen on her CT, and we discussed high intensity statin. However, she was concerned about myalgias and preferred pravastatin.   Today she is doing well. She has had no chest pain. Her breathing is improved since her hospitalization. She has mild intermittent LE edema, but not daily. Her weight is stable. She is eating and sleeping well post hospitalization.   She reports that she only needed supplemental O2 for three days after discharge, and she was then able to come off of it. She has a pulse oximeter at home and has been following her O2 sats and heart rate. She has not felt her heart racing at home. The most exertion she has done thus far is going up and down stairs.   Denies chest pain, shortness of breath at rest. No PND, orthopnea, or unexpected weight gain. No syncope or palpitations.  Past  Medical History:  Diagnosis Date  . Abnormal finding of blood chemistry   . Asthma   . H/O measles   . H/O varicella   . Hypertension   . 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[place])   . 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 Lichen sclerosus 16/94/50   Asymptomatic  . Low iron   . Mitral valve prolapse   . Osteoarthritis   . Osteoporosis   . Pneumonia   . Post herpetic neuralgia   . Rheumatoid arthritis(714.0)   . Yeast infection     Past Surgical History:  Procedure Laterality Date  . CHOLECYSTECTOMY  2011  . WISDOM TOOTH EXTRACTION      Current Medications: Current Outpatient Medications on File Prior to Visit  Medication Sig  . albuterol (VENTOLIN HFA) 108 (90 Base) MCG/ACT inhaler Inhale 2 puffs into the lungs every 6 (six) hours as needed for wheezing or shortness of breath.  Marland Kitchen amitriptyline (ELAVIL) 25 MG tablet Take 25 mg by mouth at bedtime.  . benzonatate (TESSALON) 200 MG capsule Take 1 capsule (200 mg total) by mouth 3 (three) times daily as needed for cough.  . beta carotene w/minerals (OCUVITE) tablet Take 1 tablet by mouth daily.  . cholecalciferol (VITAMIN D) 1000 UNITS tablet Take 1,000 Units by mouth daily.  Marland Kitchen dextromethorphan (DELSYM) 30 MG/5ML liquid Take 2.5 mLs (15 mg total) by mouth at bedtime. (Patient taking differently: Take 15 mg by mouth as needed for cough. )  . diltiazem (CARDIZEM CD) 120  MG 24 hr capsule Take 1 capsule (120 mg total) by mouth daily for 30 days.  Marland Kitchen gabapentin (NEURONTIN) 300 MG capsule Take 600 mg by mouth at bedtime.  Marland Kitchen ipratropium-albuterol (DUONEB) 0.5-2.5 (3) MG/3ML SOLN Take 3 mLs by nebulization 3 (three) times daily.  Marland Kitchen losartan (COZAAR) 25 MG tablet Take 25 mg by mouth daily.  . mometasone-formoterol (DULERA) 100-5 MCG/ACT AERO Inhale 2 puffs into the lungs 2 (two) times daily.  . montelukast (SINGULAIR) 10 MG tablet Take 1 tablet (10 mg total) by mouth at bedtime.  . pantoprazole (PROTONIX) 40 MG tablet Take 1 tablet (40 mg total) by  mouth 2 (two) times daily before a meal. (Patient taking differently: Take 40 mg by mouth daily. )  . polyethylene glycol (MIRALAX / GLYCOLAX) packet Take 17 g by mouth daily. (Patient taking differently: Take 17 g by mouth daily as needed (For constipation.). )  . pravastatin (PRAVACHOL) 20 MG tablet Take 1 tablet (20 mg total) by mouth daily at 6 PM.  . riTUXimab (RITUXAN) 100 MG/10ML injection Inject into the vein. Receives at [facility] but unsure of dose - no more doses for next 6 months as of 09/14/18  . venlafaxine XR (EFFEXOR-XR) 75 MG 24 hr capsule Take 75 mg by mouth at bedtime.    No current facility-administered medications on file prior to visit.      Allergies:   Penicillins, Remicade [infliximab], Sulfa antibiotics, and Sulfamethoxazole   Social History   Socioeconomic History  . Marital status: Married    Spouse name: Not on file  . Number of children: 2  . Years of education: BA  . Highest education level: Not on file  Occupational History  . Occupation: Retired  Scientific laboratory technician  . Financial resource strain: Not on file  . Food insecurity    Worry: Not on file    Inability: Not on file  . Transportation needs    Medical: Not on file    Non-medical: Not on file  Tobacco Use  . Smoking status: Never Smoker  . Smokeless tobacco: Never Used  Substance and Sexual Activity  . Alcohol use: No  . Drug use: No  . Sexual activity: Yes  Lifestyle  . Physical activity    Days per week: Not on file    Minutes per session: Not on file  . Stress: Not on file  Relationships  . Social Herbalist on phone: Not on file    Gets together: Not on file    Attends religious service: Not on file    Active member of club or organization: Not on file    Attends meetings of clubs or organizations: Not on file    Relationship status: Not on file  Other Topics Concern  . Not on file  Social History Narrative   Drinks 1 cup of coffee and 1 diet coke a day      Family History:  The patient's family history includes Anemia in her mother; Asthma in her mother; Breast cancer (age of onset: 71) in her maternal grandmother; COPD in her father; Polymyalgia rheumatica in her mother; Pulmonary fibrosis in her father.  ROS:   Please see the history of present illness.  Additional pertinent ROS:  Constitutional: Negative for chills, fever, night sweats, unintentional weight loss  HENT: Negative for ear pain and hearing loss.   Eyes: Negative for loss of vision and eye pain.  Respiratory: Negative for cough, sputum, shortness of breath, wheezing.  Cardiovascular: See HPI. Gastrointestinal: Negative for abdominal pain, melena, and hematochezia.  Genitourinary: Negative for dysuria and hematuria.  Musculoskeletal: Negative for falls and myalgias.  Skin: Negative for itching and rash.  Neurological: Negative for focal weakness, focal sensory changes and loss of consciousness.  Endo/Heme/Allergies: Does not bruise/bleed easily.    EKGs/Labs/Other Studies Reviewed:    The following studies were reviewed today: Echo 12/18/18 1. The left ventricle has normal systolic function with an ejection fraction of 60-65%. The cavity size was normal. There is mildly increased left ventricular wall thickness. Left ventricular diastolic Doppler parameters are consistent with impaired  relaxation. No evidence of left ventricular regional wall motion abnormalities. 2. The right ventricle has normal systolic function. The cavity was normal. There is no increase in right ventricular wall thickness. Right ventricular systolic pressure normal with an estimated pressure of 27.8 mmHg. 3. The mitral valve is grossly normal. There is mild mitral annular calcification present. 4. The tricuspid valve is grossly normal. There is mild tricuspid regurgitation. 5. The aortic valve is tricuspid with moderate calcification and thickening of the noncoronary cusp. 6. The aortic root is normal in size and  structure.   EKG:  EKG is personally reviewed.  The ekg ordered 12/20/18 demonstrates SR with PVC  Recent Labs: 03/21/2018: Pro B Natriuretic peptide (BNP) 38.0; TSH 2.43 03/23/2018: B Natriuretic Peptide 89.4 03/29/2018: Magnesium 2.1 07/18/2018: ALT 16 12/20/2018: BUN 15; Creatinine, Ser 0.65; Potassium 3.7; Sodium 139 12/21/2018: Hemoglobin 12.4; Platelets 284  Recent Lipid Panel    Component Value Date/Time   CHOL 222 (H) 12/19/2018 1532   TRIG 129 12/19/2018 1532   HDL 59 12/19/2018 1532   CHOLHDL 3.8 12/19/2018 1532   VLDL 26 12/19/2018 1532   LDLCALC 137 (H) 12/19/2018 1532    Physical Exam:    VS:  BP 128/70 (BP Location: Left Arm, Patient Position: Sitting, Cuff Size: Normal)   Pulse 80   Temp 97.9 F (36.6 C)   Ht _0  (1.676 m)   Wt 139 lb (63 kg)   BMI 22.44 kg/m     Wt Readings from Last 3 Encounters:  01/06/19 139 lb (63 kg)  01/02/19 139 lb 6.4 oz (63.2 kg)  12/18/18 141 lb 1.5 oz (64 kg)   GEN: Well nourished, well developed in no acute distress HEENT: Normal NECK: No JVD; No carotid bruits LYMPHATICS: No lymphadenopathy CARDIAC: regular rhythm, normal S1 and S2, no murmurs, rubs, gallops. Radial and DP pulses 2+ bilaterally. RESPIRATORY:  Clear to auscultation without rales, wheezing or rhonchi  ABDOMEN: Soft, non-tender, non-distended MUSCULOSKELETAL:  No edema; No deformity  SKIN: Warm and dry NEUROLOGIC:  Alert and oriented x 3 PSYCHIATRIC:  Normal affect   ASSESSMENT:    1. Transition of care performed with sharing of clinical summary   2. Pure hypercholesterolemia   3. Coronary artery calcification seen on CT scan   4. Essential hypertension   5. Cardiac risk counseling   6. Counseling on health promotion and disease prevention    PLAN:    Recent hospitalization for shortness of breath with elevated troponin: feeling much improved. Had prolonged discussion during hospitalization re: workup.  -echo with normal LV EF, no WMA -prefers medical  management -she was having sinus tachycardia in the hospital with ambulation, with heart rates near 150. This has resolved. Will stop diltiazem. She will call us if she sees heart rates >120 bpm on her pulse ox with exercise.  Coronary calcium on CT: suggests she may have  underlying CAD, recommend aggressive prevention  Hyperlipidemia: LDL 137. We discussed high intensity statins during her hospitalization. She has concerns re: myalgias -continue pravastatin 20 mg nightly per patient preference (refilled today)  Hypertension: at goal today -continue losartan  Cardiac risk counseling and prevention recommendations: -recommend heart healthy/Mediterranean diet, with whole grains, fruits, vegetable, fish, lean meats, nuts, and olive oil. Limit salt. -recommend moderate walking, 3-5 times/week for 30-50 minutes each session. Aim for at least 150 minutes.week. Goal should be pace of 3 miles/hours, or walking 1.5 miles in 30 minutes -recommend avoidance of tobacco products. Avoid excess alcohol. -ASCVD risk score: The 10-year ASCVD risk score Mikey Bussing DC Brooke Bonito., et al., 2013) is: 27.2%   Values used to calculate the score:     Age: 76 years     Sex: Female     Is Non-Hispanic African American: No     Diabetic: No     Tobacco smoker: No     Systolic Blood Pressure: 314 mmHg     Is BP treated: Yes     HDL Cholesterol: 59 mg/dL     Total Cholesterol: 222 mg/dL    Plan for follow up: 1 year or sooner PRN  Medication Adjustments/Labs and Tests Ordered: Current medicines are reviewed at length with the patient today.  Concerns regarding medicines are outlined above.  No orders of the defined types were placed in this encounter.  Meds ordered this encounter  Medications  . pravastatin (PRAVACHOL) 20 MG tablet    Sig: Take 1 tablet (20 mg total) by mouth every evening.    Dispense:  90 tablet    Refill:  3    Patient Instructions  Medication Instructions:  Stop: Diltiazem 120 mg   If you  need a refill on your cardiac medications before your next appointment, please call your pharmacy.   Lab work: None  Testing/Procedures: None  Follow-Up: At [facility], you and your health needs are our priority.  As part of our continuing mission to provide you with exceptional heart care, we have created designated Provider Care Teams.  These Care Teams include your primary Cardiologist (physician) and Advanced Practice Providers (APPs -  Physician Assistants and Nurse Practitioners) who all work together to provide you with the care you need, when you need it. You will need a follow up appointment in 1 years.  Please call our office 2 months in advance to schedule this appointment.  You may see Buford Dresser, MD or one of the following Advanced Practice Providers on your designated Care Team:   Rosaria Ferries, PA-C . Jory Sims, DNP, ANP     Signed, Buford Dresser, MD PhD 01/06/2019   [place]

## 2019-01-20 ENCOUNTER — Encounter: Payer: Self-pay | Admitting: Cardiology

## 2019-01-23 DIAGNOSIS — M8589 Other specified disorders of bone density and structure, multiple sites: Secondary | ICD-10-CM | POA: Diagnosis not present

## 2019-01-23 DIAGNOSIS — Z780 Asymptomatic menopausal state: Secondary | ICD-10-CM | POA: Diagnosis not present

## 2019-01-24 DIAGNOSIS — Z961 Presence of intraocular lens: Secondary | ICD-10-CM | POA: Diagnosis not present

## 2019-01-24 DIAGNOSIS — H532 Diplopia: Secondary | ICD-10-CM | POA: Diagnosis not present

## 2019-02-02 DIAGNOSIS — M0579 Rheumatoid arthritis with rheumatoid factor of multiple sites without organ or systems involvement: Secondary | ICD-10-CM | POA: Diagnosis not present

## 2019-02-16 DIAGNOSIS — M0579 Rheumatoid arthritis with rheumatoid factor of multiple sites without organ or systems involvement: Secondary | ICD-10-CM | POA: Diagnosis not present

## 2019-02-17 ENCOUNTER — Other Ambulatory Visit: Payer: Self-pay | Admitting: Nurse Practitioner

## 2019-02-22 ENCOUNTER — Other Ambulatory Visit: Payer: Self-pay | Admitting: Nurse Practitioner

## 2019-02-22 NOTE — Telephone Encounter (Signed)
Stacey [name], please advise if it it okay to refill med for pt. Thanks!

## 2019-02-22 NOTE — Telephone Encounter (Signed)
Yes ok to refill.   3 refills  Aaron Edelman

## 2019-03-03 ENCOUNTER — Telehealth: Payer: Self-pay | Admitting: Internal Medicine

## 2019-03-03 NOTE — Telephone Encounter (Signed)
Has had this non-productive cough in past, and cant get rid of it. Keeping her awake at night. No other symptoms.  Tessalon pearls., nebulizer-duoneb, dulera inhaler, singulair,  Patient not taking otc allergy med or cough medication  Patient wants to know should she come into office or make a televist.   Aaron Edelman please advise.

## 2019-03-03 NOTE — Telephone Encounter (Signed)
Thank you   B

## 2019-03-03 NOTE — Telephone Encounter (Signed)
I would recommend the patient start taking Delsym over-the-counter cough medicine to work on cough suppression.  Likely some of her cough is from her known interstitial lung disease.  We will also get patient scheduled for a follow-up with Dr. Chase Caller in clinic as he is opening up clinic days.  If symptoms continue to worsen despite using over-the-counter cough medicine then please contact our office.  Wyn Quaker, FNP

## 2019-03-03 NOTE — Telephone Encounter (Signed)
Returned call to patient with Tonia Brooms, NP recommendations for Delsym.  Patient states she is familiar with Delsym and will pick some up and follow directions on bottle.  Patient advised if no improvement with cough to give [place] a call next week to schedule visit with NP either tele or OV, depending on provider approval for OV.   Will also route a request to be scheduled for routine visit with Dr. Chase Caller since he has no open slots on his current scheduling dates.  Patient agrees to schedule MR visit and will receive a call for upcoming dates.  Patient will call here next week if needs appt with an NP for continued cough concerns.  Nothing further needed.  Route to [employer] to schedule OV with Ramaswamy next available.  Pl

## 2019-03-06 NOTE — Telephone Encounter (Signed)
Scheduled pft and ov with MR on 03/27/2019-pr

## 2019-03-08 ENCOUNTER — Encounter: Payer: Self-pay | Admitting: Primary Care

## 2019-03-08 ENCOUNTER — Ambulatory Visit (INDEPENDENT_AMBULATORY_CARE_PROVIDER_SITE_OTHER): Payer: Medicare Other | Admitting: Primary Care

## 2019-03-08 ENCOUNTER — Telehealth: Payer: Self-pay | Admitting: Internal Medicine

## 2019-03-08 ENCOUNTER — Other Ambulatory Visit: Payer: Self-pay

## 2019-03-08 DIAGNOSIS — J849 Interstitial pulmonary disease, unspecified: Secondary | ICD-10-CM

## 2019-03-08 DIAGNOSIS — J209 Acute bronchitis, unspecified: Secondary | ICD-10-CM | POA: Diagnosis not present

## 2019-03-08 MED ORDER — METHYLPREDNISOLONE 8 MG PO TABS: ORAL_TABLET | ORAL | 0 refills | Status: DC

## 2019-03-08 MED ORDER — DOXYCYCLINE HYCLATE 100 MG PO TABS: 100.0000 mg | ORAL_TABLET | Freq: Two times a day (BID) | ORAL | 0 refills | Status: DC

## 2019-03-08 NOTE — Patient Instructions (Addendum)
  Mucinex twice daily  Flutter valve 2-3 times a day Increase Dulera back to 200 twice daily until better  Rx: Medrol taper Doxycycline 100mg  twice daily x 7 days   FU: 9/14 at 9am for PFT with visit afterwards with Dr. Chase Caller

## 2019-03-08 NOTE — Progress Notes (Signed)
Virtual Visit via Telephone Note  I connected with Stacey [name] on 03/08/19 at 11:30 AM EDT by telephone and verified that I am speaking with the correct person using two identifiers.  Location: Patient: Home Provider: Office   I discussed the limitations, risks, security and privacy concerns of performing an evaluation and management service by telephone and the availability of in person appointments. I also discussed with the patient that there may be a patient responsible charge related to this service. The patient expressed understanding and agreed to proceed.   History of Present Illness: 78 year old female, never smoked. PMH significant for ILD, restrictive lung disease, chronic respiratory failure with hypoxia, GERD, HTN, RA. Patient of Dr. Chase Caller, last seen by pulmonary NP on 01/02/19. Followed [facility]ology at [facility] Maintained on Rituxan and Dulera 200. Due for spirometry with [facility] in July 2020.   03/08/2019 Reports increased chronic cough and wheezing over the last week. Cough is congested with very little production. She has not tried mucinex. She is not currently using her flutter valve. She has been using Dulera 100 as prescribed twice daily. Using oxygen which seems to help. She has been taking her DUONEB nebulizer twice a day for the last week. She is on chronic 4mg  medrol. Denies fever. She will be getting testing for COVID prior to PFTs on September 14th.    Observations/Objective: - Moderate wheezing   Assessment and Plan:  Acute bronchitis: - Increased cough and wheezing x 1 week - RX medrol taper (32mg  x 2 days; 16mg  x 2 days; 8mg  x 2 days) - Doxycycline 1 tab twice daily x 7 days  - Advised increase Dulera 200 twice daily until improved - Encouraged mucinex twice daily and flutter valve  - Will have covid testing prior to PFTs  ILD - Following with [facility] rheumatology for RA - Continues Rituxan - Scheduled for PFTs with [facility] on 9/14  Follow Up  Instructions:   9/14 with Dr. Chase Caller OR sooner if not better  I discussed the assessment and treatment plan with the patient. The patient was provided an opportunity to ask questions and all were answered. The patient agreed with the plan and demonstrated an understanding of the instructions.   The patient was advised to call back or seek an in-person evaluation if the symptoms worsen or if the condition fails to improve as anticipated.  I provided 22 minutes of non-face-to-face time during this encounter.   Martyn Ehrich, NP

## 2019-03-08 NOTE — Telephone Encounter (Signed)
Primary Pulmonologist: Ramaswamy Last office visit and with whom: 6.22.20202 What do we see them for (pulmonary problems): ILD, bronchitis2 Last OV assessment/plan: Asthma exacerbation She was admitted to the hospital on 12/17/2018 and discharged on 12/24/2018 for acute on chronic respiratory failure and asthma exacerbation.  Patient was treated with IV steroids, bronchodilators and IV antibiotics.  Patient has been doing well since hospital discharge.  Patient was discharged home with oxygen but states that her O2 sats have been stable in the upper 90s states she has not used the oxygen.  Patient states that her cough has resolved, but patient states that she would like a refill on tessalon perles.  Patient has been compliant with Dulera.  She was discharged home with duo nebs and has been taking the scheduled instead of PRN.  Patient did need some help understanding when to take her medications.  We went through her med list and clarified this.   Asthma exacerbation: Resolved   Plan: Continue Singulair daily Continue DuoNeb as needed Continue Dulera Continue albuterol as needed We will refill Tessalon Perles to have for cough as needed   Chronic hypoxemia: O2 sats have been stable since hospital discharge   Plan: Keep close watch on O2 sats and may use 2 L of O2 as needed to keep sats above 90% with exertion   History of rheumatoid arthritis with ILD Continue close follow-up with rheumatology   Patient Instructions  Continue Singulair daily   Continue Dulera >>> 2 puffs in the morning right when you wake up, rinse out your mouth after use, 12 hours later 2 puffs, rinse after use >>> Take this daily, no matter what >>> This is not a rescue inhaler    Only use your albuterol as a rescue medication to be used if you can't catch your breath by resting or doing a relaxed purse lip breathing pattern.  - The less you use it, the better it will work when you need it. - Ok to use up to 2  puffs  every 4 hours if you must but call for immediate appointment if use goes up over your usual need - Don't leave home without it !!  (think of it like the spare tire for your car)    May use Duoneb if needed for wheezing or shortness of breath   May use tessalon perles as needed for cough     Chronic Hypoxemia Keep close check on O2 sats - My use O2 at 2L [place] as needed to keep sats above 90% with exertion     Continue follow-up with [facility] rheumatology   Continue Rituxan infusions     Schedule a follow-up spirometry with [facility] prior to your office visit with Dr. Chase Caller in 2-3 months Schedule a follow-up visit with Dr. Chase Caller in 2-3 months in ILD clinic   Fenton Foy, NP 01/02/2019   Was appointment offered to patient (explain)?  Yes - patient scheduled for telephone visit with Beth NP for today 03/08/19 @ 1130 (pt uncomfortable with trying video visit)  Reason for call: called spoke with patient who reported her chronic cough has worsened over the past several weeks, and is a dry cough.  Does note wheezing - with some audible wheezing on the phone.  Patient does have some tightness in chest.  Denies any purulent sputum, hemoptysis, f/c/s, n/v/d/abd pain.      Patient does have an appt and PFT with MR on 9.14.2020

## 2019-03-13 ENCOUNTER — Other Ambulatory Visit: Payer: Self-pay | Admitting: Internal Medicine

## 2019-03-24 ENCOUNTER — Other Ambulatory Visit (HOSPITAL_COMMUNITY)
Admission: RE | Admit: 2019-03-24 | Discharge: 2019-03-24 | Disposition: A | Discharge status: Home / Self Care | Payer: Medicare Other | Source: Ambulatory Visit | Attending: Internal Medicine | Admitting: Internal Medicine

## 2019-03-24 DIAGNOSIS — Z20828 Contact with and (suspected) exposure to other viral communicable diseases: Secondary | ICD-10-CM | POA: Insufficient documentation

## 2019-03-24 DIAGNOSIS — Z01812 Encounter for preprocedural laboratory examination: Secondary | ICD-10-CM | POA: Insufficient documentation

## 2019-03-24 LAB — SARS CORONAVIRUS 2 (TAT 6-24 HRS): SARS Coronavirus 2: NEGATIVE

## 2019-03-27 ENCOUNTER — Ambulatory Visit (INDEPENDENT_AMBULATORY_CARE_PROVIDER_SITE_OTHER): Payer: Medicare Other | Admitting: Internal Medicine

## 2019-03-27 ENCOUNTER — Other Ambulatory Visit: Payer: Self-pay

## 2019-03-27 DIAGNOSIS — I2510 Atherosclerotic heart disease of native coronary artery without angina pectoris: Secondary | ICD-10-CM | POA: Diagnosis not present

## 2019-03-27 DIAGNOSIS — M358 Other specified systemic involvement of connective tissue: Secondary | ICD-10-CM | POA: Diagnosis not present

## 2019-03-27 DIAGNOSIS — J45909 Unspecified asthma, uncomplicated: Secondary | ICD-10-CM

## 2019-03-27 DIAGNOSIS — J8489 Other specified interstitial pulmonary diseases: Secondary | ICD-10-CM | POA: Diagnosis not present

## 2019-03-27 DIAGNOSIS — R05 Cough: Secondary | ICD-10-CM | POA: Diagnosis not present

## 2019-03-27 DIAGNOSIS — R053 Chronic cough: Secondary | ICD-10-CM

## 2019-03-27 DIAGNOSIS — M069 Rheumatoid arthritis, unspecified: Secondary | ICD-10-CM | POA: Diagnosis not present

## 2019-03-27 DIAGNOSIS — J849 Interstitial pulmonary disease, unspecified: Secondary | ICD-10-CM | POA: Diagnosis not present

## 2019-03-27 LAB — PULMONARY FUNCTION TEST
DL/VA % pred: 96 %
DL/VA: 3.89 ml/min/mmHg/L
DLCO unc % pred: 77 %
DLCO unc: 15.04 ml/min/mmHg
FEF 25-75 Pre: 1.29 L/sec
FEF2575-%Pred-Pre: 84 %
FEV1-%Pred-Pre: 68 %
FEV1-Pre: 1.42 L
FEV1FVC-%Pred-Pre: 106 %
FEV6-%Pred-Pre: 68 %
FEV6-Pre: 1.79 L
FEV6FVC-%Pred-Pre: 105 %
FVC-%Pred-Pre: 64 %
FVC-Pre: 1.79 L
Pre FEV1/FVC ratio: 79 %
Pre FEV6/FVC Ratio: 100 %

## 2019-03-27 MED ORDER — PREDNISONE 10 MG PO TABS: ORAL_TABLET | ORAL | 0 refills | Status: DC

## 2019-03-27 NOTE — Progress Notes (Signed)
Brief patient profile:  46 yowf  never smoker with allergies/inhalers as child outgrew by [employer] then  RA since around 2000  Prednisone  x decades and prev eval by Dr Stacey [name] around 2004 for sob resolved s maint rx and referred 05/26/2013 by Dr Stacey [name] for bronchitis and abn cxr   History of Present Illness  05/26/2013 1st [facility] office visit/ Wert cc June 2014 dx pna  In [place] and remicade stopped and 100% better and placed arencia in September 2014  then abruptly worse first week in November with cough green sputum s nasal symptoms, fever low grade and no cp or cough and completely recovered prior to [place] does not recall abx but issue is why keeps getting sick and abn CT Chest (see below).   Arthritis symptoms well controlled at present on Rx for RA rec Nexium 40 mg Take 30-60 min before first meal of the day and add pepcid 20 mg one at bedtime whenever coughing.     10/19/2017  f/u ov/Wert re:  RA lung dz  Chief Complaint  Patient presents with   Follow-up    Cough is much improved, but has not resolved yet. Cough is non prod. She has not had to use her neb.   Dyspnea:  Not limited by breathing from desired activities  But some doe x steps Cough: daytime > noct dry  Sleep: fine  SABA use:  No saba Medrol 4 mg a/w 17m per day/ ok control of arthritis  rec Start back on gabapentin up to 300 mg each am  in addition to the the two at bedtime  If not better increase the medrol to 8 mg daily until bettter then taper back to where you      01/10/2018  f/u ov/Wert re:  RA  Lung dz Medrol  4 mg  One alternating with a half Chief Complaint  Patient presents with   Follow-up    PFT's done. Her breathing has been gradually worsening since the last visit. She has occ cough- non prod.   Dyspnea gradually worse since last ov:  MMRC1 =  MMRC3 = can't walk 100 yards even at a slow pace at a flat grade s stopping due to sob    Gradually x 3 m / more fatigue / no change in  arthritis  Cough: not an issue rec Protonix 40 mg Take 30-60 min before first meal of the day  GERD diet   01/17/2018 acute extended ov/Wert re: cough on medrol 4 mg  One a/w one half  Chief Complaint  Patient presents with   Acute Visit    started coughing 01/11/18- occ prod with minimal green sputum.  She states also wheezing and having increased SOB.    abruptly worse 01/11/18 with severe 24/7 coughing >>  prod min green mucus esp in am/ assoc with subjective wheeze and did not follow previous contingencies re flutter / saba/ increase medrol and admits she does not rember those written instructions nor how to use the neb provided .  No fever/ comfortable at rest sitting  rec For cough > mucinex dm 1200 mg every 12 hours and cough into the flutter valve as much as possible  Doxycycline 100 mg twice daily x 10 days with glass of water Medrol 436mx 2 now and take 2  daily until cough is better then 1 daily x 5 days and then resume the previous dose  Shortness of breath/ wheezing/  still coughing > albuterol neb every 4 hours as needed      01/20/2018 acute extended ov/Wert re: refractory cough and sob 01/11/18 Chief Complaint  Patient presents with   Acute Visit    she is not feeling better, coughing , very SOB, wheezing  mucus now clear/scant  on doxy/ neb machine not working (tube would not plug into the side s adequate force and she was not capable of applying it due to RA hands. Cough/ wheeze/ sob 24/7 / flutter not helping/ can't lie down at hs   rec While coughing protonix 40 mg Take 30- 60 min before your first and last meals of the day  Shortness of breath/ wheezing/ still coughing > albuterol neb every 4 hours as needed  Depomedrol 120 mg IM and medrol 32 mg daily x 2 days,  then 16 mg x 3 days,  Then 8 mg x 4 days , then resume the 4 mg daily  For severe cough > tylenol 3# one every 4 hours if needed  Go to ER if condition worsens on above plan       Date of admission:  01/22/2018             Date of discharge: 01/27/2018   History of present illness: As per the H and P dictated on admission, "[name] a78 y.o.female,w Rheumatoid arthritis, ILD Asthma, apparently c/o increase in dyspnea this evening. Dry cough. Denies fever, chills, cp, palp, N/v, diarrhea, brbpr, black stool. Pt notes recently being given steroid injection in office as well as being placed on doxycycline. This might have helped slightly but pt worse [name] Course:  Summary of her active problems in the [name] is as following. 1 dyspnea/hypoxemia/ILD Concerned that likely GERD Is causing ILD. Patient with cough.  Patient with complaints of awakening with cough and also with oral intake which is slightly improved since 01/24/2018. assessed by speech therapy and speech therapy raising concern of esophageal component but no signs of aspiration.  2D echo with a EF of 55 to 60% with no wall motion abnormalities, grade 1 diastolic dysfunction.  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 Pulmonary felt that the patient should be on scheduled Stacey [name]. I have placed the patient on scheduled potassium before sleep. Continue steroids on discharge continue Mucinex and Claritin as well as inhalers. Patient will follow-up with pulmonary outpatient  2. Gastroesophageal reflux disease Continue PPI and H2 blocker.I changed PPI to Stacey [name].  3. Rheumatoid arthritis Outpatient follow-up.   4. Anxiety Continue Stacey [name].    All other chronic medical condition were stable during the hospitalization.  Patient was ambulatory without any assistance. On the day of the discharge the patient's vitals were stable , and no other acute medical condition were reported by patient. the patient was felt safe to be discharge at home with family.  Consultants: Stacey [name]  Procedures: Echocardiogram      03/21/2018  f/u ov/Wert re:   S/p admit was  transiently better  and downhill since Labor day on medrol 4 mg daily  Last orencia on Sept 4th 2019  Chief Complaint  Patient presents with   Acute Visit    Per patient, she has had a dry cough since July 2019. She has been wheezing as well. Increased fatigue. Body aches. Denies any fever or chills.   Dyspnea:  MMRC4  = sob if tries to leave home or while getting dressed   Cough: harsh/ hacking mostly dry/ has flutter not using  SABA use: not much better with rx   No obvious day to day or daytime variability or assoc excess/ purulent sputum or mucus plugs or hemoptysis or cp or chest tightness, subjective wheeze or overt sinus or hb symptoms.     Also denies any obvious fluctuation of symptoms with weather or environmental changes or other aggravating or alleviating factors except as outlined above   No unusual exposure hx or h/o childhood pna/ asthma or knowledge of premature birth.   INpatient consult 03/26/18 77 year old with rheumatoid arthoritis.At baseline the patient lives at home with her husband and is independent of ADLs.  Has been on many immune suppressants over decades and curently on orencia x 4 year and prednisone. Does not recollect being on bactrim/dapsone. Chart mentions BOOP/MAI in 2001 but she denies this. 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 Got admitted. Rx with steroids. Per Notes - clnical suspicion of  arytenoid inflmmation related wheeze noticed (she also reports asthma NOS). Follolwup with ENT recommended (but not seen one as yet). She also appears to have passed swallow with rec for regular diet with thin liquids but did to have mild eso stricture and reflux during testing . PFT shows 10% FVC decline for first ime. CT chest at this tme (aug 2019) showed new rLL infiltrate. ECHO July 2019 without evicence of  elevated PASP and saw cards [facility] June 2019 and was considered to have worsenin dyspnea due to [place] [name] issues (reports stress test at [facility] that was normal but I cannot see it)  She reports after discharge she got better but in last several weeks has deteriorated with cough and dyspnea. There is new hypoxemia (currently RA with nail polish and poor circulation - 89% pulse ox) needing 2L [place]. Per [facility] improved with steroids and abx. CTA 03/24/2018 => shows that RLL inifltrate has improved . Other chronic ILD changes + and small  Hiatal hernia + witthout change.  Review of lab work does show eosinophilia at time of admision  EVENTS 03/21/18 - IgE -5, blood allegy panel - negative, 03/23/2018 - - admit . HIGH EOS 2300, ESR 48, BNP 89 , HIV neg 9/12- PCT negative, RVP negative 9/14 -  PCT < 0.1, Urine strep - negative, MRSA PCR - positive. IgE - normal 4, Blood allergy panel repeat - negative 03/26/2018 - leading consideration for airway (BO in RA +/- asthma) related flare either due to MRSA bronchitis or clinical suspicion of arytenoid inflmmation +/- GERD relatd flare (she has small hiatal hernia)  +/- ? Dysphagia  up causing mild hypoxemia acute resp failure, wheeze . Allergy and IgE blood work negative thought. Patient reports being better but says she is choking on drinkin water [facility] MD says wheeze improved significantly with steroids.  RN says was down to RA yesterday evening but needed 1L [place] at sleep. Today -Room air at rest 94% and desaturated to 86% walking 60 feet 03/27/2018  - better. Off o2 at rest. STill coughs with water and when lies down.  Husband at bedside. Both requesting ILD clinic followup . Desaturated t 79% walking 90 feet.   OV 04/12/2018  Subjective:  Patient ID: Stacey [name], female , DOB: 1940-10-11 , age 77 y.o. , MRN: [id number] , ADDRESS: [place] [address]   04/12/2018 -  Chief Complaint  Patient presents with   Consult    Pt is a former  [facility] pt.  Pt denies any current complaints of cough, SOB, or CP but states the cough she originally had ended her up in the hosp 9/11-9/17 with dx acute respiratory failure. Pt does wear 2pulse with exertion and also wears 2L continuous when at home.     HPI Stacey [name] 78 y.o. -presents for follow-up to the ILD clinic.  She is known to have rheumatoid arthritis with ILD changes.  She had been followed by Dr. Christinia Gully.  However in July 2019 in September 2019 she has had 2 admissions to the [name] with respiratory distress and hypoxemic respiratory failure.  In the first 1 that seem to be right lower lobe infiltrate and then she improved from it but in the second 1 even though the right lower lobe infiltrates were better she still was hypoxemic.  Acid reflux and dysphagia was considered a possible etiology but she passed swallow study 2 times.  They thought she had some reflux.  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  At the same time it is not clear if the ILD is progressive based on pulmonary function testing below   At this point in time she tells me that she is getting home physical therapy.  Her fatigue is improving but it is not fully resolved.  She was discharged on continuous oxygen which she is using.  However she is feeling less short of breath.  Today in fact when we turned her oxygen off and walked her she did not desaturate and this is a significant improvement.  She is on monthly Orencia through the [facility] rheumatologist Dr. Eda Paschal.  At this point in time she is put the Orencia on hold.  She told me that she is been on Orencia for 4 years and never had a respiratory exacerbation still recently x 2.  Although before going on Orencia she had pneumonia while on Remicade and the Remicade.  In terms of her rheumatoid arthritis she hardly has any pain.  Her joint architecture is fairly well-preserved because of various  immunomodulators over time.  She says that she was on Remicade for years and when she stopped it for 8 weeks before the switch to [place] she never really had a relapse in her rheumatoid arthritis.  She is largely pain and stiffness free.  She believes she can go without  her Orencia for a while.  Review of the literature shows greater than 10% chance of a respiratory infection especially COPD exacerbation.  Although the time frame for this is unclear.       OV 06/07/2018  Subjective:  Patient ID: Stacey [name], female , DOB: 08-Apr-1941 , age 56 y.o. , MRN: [id number] , ADDRESS: [place] [address]   06/07/2018 -   Chief Complaint  Patient presents with   Follow-up    ILD, PFT done today, some wheezing and coughing but better tha before   Rheumatoid arthritis ILD and asthma/obstructive lung disease phenotype on Dulera  HPI Stacey [name] 78 y.o. -presents for routine follow-up with her husband.  She is here to follow-up with [name] Dr. Stann Mainland.  She plans to do this in December 2019.  She continues to be off Orencia.  Her joints are slowly getting stiff again.  She believes that she will need to be back on immunosuppression agent again.  She  currently continues Medrol 4 mg alternating with 2 mg.  This for her rheumatoid arthritis.  In terms of her joints she continues on Medrol 4 mg alternating with 2 mg but not on any other immunosuppression agent.  Overall she is been stable but for the last 2 weeks has had green sputum and wheezing and chest congestion and cough.  She recently visited her husband who was hospitalized and walking the long hallways at [facility] made a short of breath but she thinks this is probably baseline for her.  There are no other new issues.  She did have spirometry and DLCO and this shows a decline compared to September 2019 and a similar to July 2019.  It is documented below.  This is probably reflective of a  flareup   OV 07/21/2018  Subjective:  Patient ID: Stacey [name], female , DOB: 30-Mar-1941 , age 53 y.o. , MRN: [id number] , ADDRESS: [place] [address]   07/21/2018 -   Chief Complaint  Patient presents with   Follow-up    Pt states she has been doing well since last visit. States she is about to begin Rituxan with [facility]. Pt still becomes SOB with exertion. Denies any complaints of cough or CP.   Rheumatoid arthritis ILD and asthma/obstructive lung disease phenotype on Dulera  HPI Stacey [name] 78 y.o. -presents for follow-up of the above.  Last seen just before Thanksgiving 2019.  In the interim overall stable although on June 23, 2018 she climbs a steep flight of stairs which is unusual exertion for her and she became very dyspneic.  Following day saw Dr. Stann Mainland at [facility] and was given Z-Pak and prednisone and started feeling better.  Although it is not fully clear to me she had fever and bronchitic symptoms.  I reviewed Dr. Stann Mainland note.  Dr. Stann Mainland is decided to start Rituxan for rheumatoid arthritis.  She is only having some minimal joint pain at this point.  She is off Orencia and continues to be off [place].  She did have some blood work with [place] before starting Rituxan.  She is due to see Dr. Stann Mainland within the next week and start her Rituxan.  Her liver function test July 18, 2017 is normal hemoglobin is normal.  CRP is also normal.  We did spirometry and walking desaturation test.  These show improvement compared to before and these are documented below.  Currently she not using nighttime or daytime oxygen.  She is wondering if she could switch rheumatology care to [facility].  This is because while she likes [employer] she is getting older and more frail and feels some body local would be of help.  I have sent a message to Dr. Estanislado Pandy inquiring.  Certainly we can help her with Rituxan infusions at [facility]  if needed.  She will check on this with her [facility] rheumatologist.       OV 03/27/2019  Subjective:  Patient ID: Stacey [name], female , DOB: 02-25-1941 , age 35 y.o. , MRN: [id number] , ADDRESS: [place] [address]  Rheumatoid arthritis ILD and asthma/obstructive lung disease phenotype on [place] [name]   03/27/2019 -  Rourine fu   HPI Stacey [name] 78 y.o. -presents for the above.  Last seen in January 2020.  After that she has seen Dr. Stann Mainland rheumatology at [name].  She is now getting Rituxan 2 doses every 6 months.  She says  is helped her joints and her stiffness.  She is a little bit more mobile than usual.  However in terms of her respiratory status she continues to have episodic cough.  In June 2020 she again got hypoxemic and got admitted.  Since then she has had episodic cough.  She had a respiratory exacerbation in June 2020 for the admission she got steroids.  This seemed to help.  She is also on a higher dose Dulera right now.  In terms of her cough this seems to be her biggest problem.  She seems to be on Dulera, Singulair scheduled with also Tessalon and Delsym and DuoNeb.  Noticed that she is on gabapentin Elavil and Stacey [name] but I think this is all from neuropathy and other issues and not primarily indicated for cough.  Her last high-resolution CT scan of the chest was 1 year ago.  She says the dyspnea itself is not worse.  She is currently not using oxygen.  On exam she did have some wheezing.  Currently she has white and brown sputum but this is baseline.  In terms of a COVID wrist she has been tested recently couple of times and this is been negative.  She is isolating well.  She wanted to know COVID prevention activities and risk status and masking strategies.  SYMPTOM SCALE - ILD 03/27/2019   O2 use ra  Shortness of Breath 0 -> 5 scale with 5 being worst (score 6 If unable to do)  At rest 0  Simple tasks - showers, clothes change, eating, shaving  0  Household (dishes, doing bed, laundry) 2  Shopping 2  Walking level at own pace 2  Walking keeping up with others of same age 37  Walking up Stairs 3  Walking up [place] 3  Total (40 - 48) Dyspnea Score 15  How bad is your cough? 2  How bad is your fatigue 3       Results for Stacey [name], Stacey [name] (MRN [id number]) as of 03/27/2019 09:43  Ref. Range 07/17/2013 09:53 02/07/2016 13:09 01/11/2017 11:05 01/10/2018 10:13 05/17/2018 14:24 06/07/2018 10:57 07/21/2018 10:53 03/27/2019 09:02  FVC-Pre Latest Units: L 2.13 2.16 2.14 1.93 1.99 1.81 2.18 1.79  FVC-%Pred-Pre Latest Units: % 68 71 71 65 75 68 77 64   Results for Stacey [name], Stacey [name] (MRN [id number]) as of 03/27/2019 09:43  Ref. Range 07/17/2013 09:53 02/07/2016 13:09 01/11/2017 11:05 01/10/2018 10:13 05/17/2018 14:24 06/07/2018 10:57 07/21/2018 10:53 03/27/2019 09:02  DLCO unc Latest Units: ml/min/mmHg 18.85 18.25 16.50 16.09 16.58 14.06  15.04  DLCO unc % pred Latest Units: % 69 67 61 59 70 59  77    Simple office walk  feet x  3 laps goal with forehead probe 04/12/2018  07/21/2018   O2 used Room air - off o2 x 10 min Room air   Number laps completed 3 x 185 feet 3 x 250 feet  Comments about pace normal normal  Resting Pulse Ox/HR 99% and 72/min 97% and 74/min  Final Pulse Ox/HR 98% and 95/min 96% and 96/min  Desaturated </= 88% no no  Desaturated <= 3% points no no  Got Tachycardic >/= 90/min yes yes  Symptoms at end of test No complaint Mild dyspnea  Miscellaneous comments improed from [name] Same v improved    ROS - per HPI     has a past medical history of Abnormal finding of blood chemistry, Asthma, H/O measles, H/O varicella, Hypertension, 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[place]), 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   reports that she has never smoked. She has never used smokeless  tobacco.  Past Surgical History:  Procedure Laterality Date   CHOLECYSTECTOMY  2011   WISDOM TOOTH EXTRACTION      Allergies  Allergen Reactions   Penicillins Other (See Comments)    Reaction unknown occurred during childhood Has patient had a PCN reaction causing immediate rash, facial/tongue/throat swelling, SOB or lightheadedness with hypotension: Unknown Has patient had a PCN reaction causing severe rash involving mucus membranes or skin necrosis: Unknown Has patient had a PCN reaction that required hospitalization: No Has patient had a PCN reaction occurring within the last 10 years: No If all of the above answers are "NO", then may proceed with Cephalosporin use.  Unsur   Remicade [Infliximab] Other (See Comments)    Reaction unknown   Sulfa Antibiotics Other (See Comments)    Reaction unknown   Sulfamethoxazole Other (See Comments)    Unsure of reaction As a child not sure     Immunization History  Administered Date(s) Administered   Influenza Split 04/12/2013, 04/12/2014, 03/14/2015, 04/23/2016   Influenza, High Dose Seasonal PF 04/12/2017, 03/24/2018   Influenza-Unspecified 04/03/2013, 04/22/2017   Pneumococcal Conjugate-13 04/03/2013, 09/28/2014   Pneumococcal Polysaccharide-23 07/13/2012, 07/24/2013   Tdap 12/10/2017    Family History  Problem Relation Age of Onset   Asthma Mother    Anemia Mother    Polymyalgia rheumatica Mother    COPD Father    Pulmonary fibrosis Father    Breast cancer Maternal Grandmother 88     Current Outpatient Medications:    albuterol (VENTOLIN HFA) 108 (90 Base) MCG/ACT inhaler, Inhale 2 puffs into the lungs every 6 (six) hours as needed for wheezing or shortness of breath. (Patient not taking: Reported on 03/08/2019), Disp: 18 g, Rfl: 0   amitriptyline (ELAVIL) 25 MG tablet, Take 25 mg by mouth at bedtime., Disp: , Rfl: 0   benzonatate (TESSALON) 200 MG capsule, TAKE 1 CAPSULE BY MOUTH THREE TIMES DAILY  AS NEEDED FOR COUGH., Disp: 20 capsule, Rfl: 0   beta carotene w/minerals (OCUVITE) tablet, Take 1 tablet by mouth daily., Disp: , Rfl:    cholecalciferol (VITAMIN D) 1000 UNITS tablet, Take 1,000 Units by mouth daily., Disp: , Rfl:    dextromethorphan (DELSYM) 30 MG/5ML liquid, Take 2.5 mLs (15 mg total) by mouth at bedtime. (Patient taking differently: Take 15 mg by mouth as needed for cough. ), Disp: 89 mL, Rfl: 0   doxycycline (VIBRA-TABS) 100 MG tablet, Take 1 tablet (100 mg total) by mouth 2 (two) times daily., Disp: 14 tablet, Rfl: 0   gabapentin (NEURONTIN) 300 MG capsule, Take 600 mg by mouth at bedtime., Disp: , Rfl:    ipratropium-albuterol (DUONEB) 0.5-2.5 (3) MG/3ML SOLN, Take 3 mLs by nebulization 3 (three) times daily., Disp: 360 mL, Rfl: 0   losartan (COZAAR) 25 MG tablet, Take 25 mg by mouth daily., Disp: , Rfl: 11   methylPREDNISolone (MEDROL) 8 MG tablet, Take 4 tabs x 2 days; then 2 tablets x 2 days; then 1 tab x 2 days, Disp: 14 tablet, Rfl: 0   mometasone-formoterol (DULERA) 100-5 MCG/ACT AERO, Inhale 2 puffs into the lungs 2 (two) times daily., Disp: 13 g, Rfl: 0   montelukast (SINGULAIR) 10 MG tablet, TAKE (1) TABLET DAILY AT BEDTIME., Disp: 30 tablet, Rfl: 2   pantoprazole (PROTONIX) 40 MG tablet, Take 1  tablet (40 mg total) by mouth 2 (two) times daily before a meal. (Patient taking differently: Take 40 mg by mouth daily. ), Disp: 180 tablet, Rfl: 3   polyethylene glycol (MIRALAX / GLYCOLAX) packet, Take 17 g by mouth daily. (Patient taking differently: Take 17 g by mouth daily as needed (For constipation.). ), Disp: 14 each, Rfl: 0   pravastatin (PRAVACHOL) 20 MG tablet, Take 1 tablet (20 mg total) by mouth every evening., Disp: 90 tablet, Rfl: 3   riTUXimab (RITUXAN) 100 MG/10ML injection, Inject into the vein. Receives at [name] but unsure of dose - no more doses for next 6 months as of 09/14/18, Disp: , Rfl:    venlafaxine XR (Stacey [name]-XR) 75 MG 24 hr capsule,  Take 75 mg by mouth at bedtime. , Disp: , Rfl:       Objective:   There were no vitals filed for this visit.  Estimated body mass index is 22.44 kg/m as calculated from the following:   Height as of 01/06/19: '5\' 6"'$  (1.676 m).   Weight as of 01/06/19: 139 lb (63 kg).  '@WEIGHTCHANGE'$ @  There were no vitals filed for this visit.   Physical Exam  General Appearance:    Alert, cooperative, no distress, appears stated age - frail , Deconditioned looking - somewhat , OBESE  - no, Sitting on Wheelchair -  no  Head:    Normocephalic, without obvious abnormality, atraumatic  Eyes:    PERRL, conjunctiva/corneas clear,  Ears:    Normal TM's and external ear canals, both ears  Nose:   Nares normal, septum midline, mucosa normal, no drainage    or sinus tenderness. OXYGEN ON  - no . Patient is @ no   Throat:   Lips, mucosa, and tongue normal; teeth and gums normal. Cyanosis on lips - no  Neck:   Supple, symmetrical, trachea midline, no adenopathy;    thyroid:  no enlargement/tenderness/nodules; no carotid   bruit or JVD  Back:     Symmetric, no curvature, ROM normal, no CVA tenderness  Lungs:     Distress - no , Wheeze yes mild, Barrell Chest - no, Purse lip breathing - no, Crackles - yes at base   Chest Wall:    No tenderness or deformity.    Heart:    Regular rate and rhythm, S1 and S2 normal, no rub   or gallop, Murmur - no  Breast Exam:    NOT DONE  Abdomen:     Soft, non-tender, bowel sounds active all four quadrants,    no masses, no organomegaly. Visceral obesity - no  Genitalia:   NOT DONE  Rectal:   NOT DONE  Extremities:   Extremities - normal, Has Cane - no, Clubbing - no, Edema - no  Pulses:   2+ and symmetric all extremities  Skin:   Stigmata of Connective Tissue Disease - no  Lymph nodes:   Cervical, supraclavicular, and axillary nodes normal  Psychiatric:  Neurologic:   Pleasant - yes, Anxious - no, Flat affect - no  CAm-ICU - neg, Alert and Oriented x 3 - yes, Moves all 4s  - yes, Speech - normal, Cognition - intact           Assessment:       ICD-10-CM   1. ILD (interstitial lung disease) ([place])  J84.9   2. Asthma, unspecified asthma severity, unspecified whether complicated, unspecified whether persistent  J45.909   3. Rheumatoid arthritis involving multiple sites, unspecified rheumatoid factor presence ([place])  M06.9   4. Chronic cough  R05   5. Interstitial pulmonary disease ([place])  J84.9        Plan:     Patient Instructions     ICD-10-CM   1. ILD (interstitial lung disease) ([place])  J84.9   2. Asthma, unspecified asthma severity, unspecified whether complicated, unspecified whether persistent  J45.909   3. Rheumatoid arthritis involving multiple sites, unspecified rheumatoid factor presence (HCC)  M06.9   4. 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  We do need to rule out any rheumatoid arthritis involvement of your voice box and throat  Plan - Please take prednisone 40 mg x1 day, then 30 mg x1 day, then 20 mg x1 day, then 10 mg x1 day, and then 5 mg x1 day and then go back to taking your baseline Medrol for rheumatoid arthritis  -Hold off on antibiotics  -Hold off on COVID testing [only low risk activities  -Okay to continue Rituxan every 6 months for rheumatoid arthritis according to Dr. Rosanne Sack seems to be helping her joints]  -Hold off on flu shot today  -Do high-resolution CT scan of the chest next 1 month anytime  -ENT consult because of cough -anytime in the next 2-4 weeks  -In terms of his medications for respiratory issues  -Dulera is 100 mcg / 4.5 at 2 puff 2 times daily [going back to the lower dose] -Singulair 10 mg at night -Gabapentin 600 mg at bedtime as before -not sure if you are taking this for cough but a  combination of the other medication you are taking such as Elavil and Stacey [name] can help cough -Tessalon and Delsym and DuoNeb all as needed  Follow-up -4-6 weeks but after doing high-resolution CT chest and also ENT evaluation; 30-minute slot   PS: also given covid risk chart from [employer] and contact number for KN95 mask  > 50% of this > 40 min visit spent in face to face counseling or/and coordination of care - by this undersigned MD - Dr Brand Males. This includes one or more of the following documented above: discussion of test results, diagnostic or treatment recommendations, prognosis, risks and benefits of management options, instructions, education, compliance or risk-factor reduction  SIGNATURE    Dr. Brand Males, M.D., F.C.C.P,  Pulmonary and Critical Care Medicine Staff Physician, [place] Director - Interstitial Lung Disease  Program  Pulmonary [place] [facility] [place], [place], [address]  Pager: [phone], If no answer or between  15:00h - 7:00h: call [phone] Telephone: [phone]  10:21 AM 03/27/2019

## 2019-03-27 NOTE — Patient Instructions (Addendum)
ICD-10-CM   1. ILD (interstitial lung disease) ([place])  J84.9   2. Asthma, unspecified asthma severity, unspecified whether complicated, unspecified whether persistent  J45.909   3. Rheumatoid arthritis involving multiple sites, unspecified rheumatoid factor presence (HCC)  M06.9   4. 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  We do need to rule out any rheumatoid arthritis involvement of your voice box and throat  Plan - Please take prednisone 40 mg x1 day, then 30 mg x1 day, then 20 mg x1 day, then 10 mg x1 day, and then 5 mg x1 day and then go back to taking your baseline Medrol for rheumatoid arthritis  -Hold off on antibiotics  -Hold off on COVID testing [only low risk activities  -Okay to continue Rituxan every 6 months for rheumatoid arthritis according to Dr. Rosanne Sack seems to be helping her joints]  -Hold off on flu shot today  -Do high-resolution CT scan of the chest next 1 month anytime  -ENT consult because of cough -anytime in the next 2-4 weeks  -In terms of his medications for respiratory issues  -Dulera is 100 mcg / 4.5 at 2 puff 2 times daily [going back to the lower dose] -Singulair 10 mg at night -Gabapentin 600 mg at bedtime as before -not sure if you are taking this for cough but a combination of the other medication you are taking such as Elavil and Effexor can help cough -Tessalon and Delsym and DuoNeb all as needed  Follow-up -4-6 weeks but after doing high-resolution CT chest and also ENT evaluation; 30-minute slot

## 2019-03-27 NOTE — Progress Notes (Signed)
Spirometry and dlco done today. 

## 2019-04-07 DIAGNOSIS — Z23 Encounter for immunization: Secondary | ICD-10-CM | POA: Diagnosis not present

## 2019-04-17 DIAGNOSIS — R05 Cough: Secondary | ICD-10-CM | POA: Diagnosis not present

## 2019-04-17 DIAGNOSIS — J849 Interstitial pulmonary disease, unspecified: Secondary | ICD-10-CM | POA: Diagnosis not present

## 2019-04-24 ENCOUNTER — Other Ambulatory Visit: Payer: Self-pay

## 2019-04-24 ENCOUNTER — Ambulatory Visit (INDEPENDENT_AMBULATORY_CARE_PROVIDER_SITE_OTHER)
Admission: RE | Admit: 2019-04-24 | Discharge: 2019-04-24 | Disposition: A | Discharge status: Home / Self Care | Payer: Medicare Other | Source: Ambulatory Visit | Attending: Internal Medicine | Admitting: Internal Medicine

## 2019-04-24 DIAGNOSIS — J849 Interstitial pulmonary disease, unspecified: Secondary | ICD-10-CM | POA: Diagnosis not present

## 2019-05-06 ENCOUNTER — Encounter (HOSPITAL_COMMUNITY): Payer: Self-pay

## 2019-05-06 ENCOUNTER — Other Ambulatory Visit: Payer: Self-pay

## 2019-05-06 ENCOUNTER — Inpatient Hospital Stay (HOSPITAL_COMMUNITY)
Admission: EM | Admit: 2019-05-06 | Discharge: 2019-05-10 | DRG: 196 | Disposition: A | Discharge status: Home / Self Care | Payer: Medicare Other | Attending: Internal Medicine | Admitting: Internal Medicine

## 2019-05-06 ENCOUNTER — Emergency Department (HOSPITAL_COMMUNITY): Payer: Medicare Other

## 2019-05-06 DIAGNOSIS — M810 Age-related osteoporosis without current pathological fracture: Secondary | ICD-10-CM | POA: Diagnosis present

## 2019-05-06 DIAGNOSIS — Z888 Allergy status to other drugs, medicaments and biological substances status: Secondary | ICD-10-CM

## 2019-05-06 DIAGNOSIS — E785 Hyperlipidemia, unspecified: Secondary | ICD-10-CM | POA: Diagnosis present

## 2019-05-06 DIAGNOSIS — J45901 Unspecified asthma with (acute) exacerbation: Secondary | ICD-10-CM | POA: Diagnosis present

## 2019-05-06 DIAGNOSIS — Z7951 Long term (current) use of inhaled steroids: Secondary | ICD-10-CM

## 2019-05-06 DIAGNOSIS — Z9049 Acquired absence of other specified parts of digestive tract: Secondary | ICD-10-CM

## 2019-05-06 DIAGNOSIS — Z9981 Dependence on supplemental oxygen: Secondary | ICD-10-CM

## 2019-05-06 DIAGNOSIS — Z7952 Long term (current) use of systemic steroids: Secondary | ICD-10-CM

## 2019-05-06 DIAGNOSIS — M069 Rheumatoid arthritis, unspecified: Secondary | ICD-10-CM | POA: Diagnosis not present

## 2019-05-06 DIAGNOSIS — J449 Chronic obstructive pulmonary disease, unspecified: Secondary | ICD-10-CM | POA: Diagnosis present

## 2019-05-06 DIAGNOSIS — Z880 Allergy status to penicillin: Secondary | ICD-10-CM

## 2019-05-06 DIAGNOSIS — R0602 Shortness of breath: Secondary | ICD-10-CM | POA: Diagnosis not present

## 2019-05-06 DIAGNOSIS — Z882 Allergy status to sulfonamides status: Secondary | ICD-10-CM

## 2019-05-06 DIAGNOSIS — Z803 Family history of malignant neoplasm of breast: Secondary | ICD-10-CM

## 2019-05-06 DIAGNOSIS — J45909 Unspecified asthma, uncomplicated: Secondary | ICD-10-CM | POA: Diagnosis present

## 2019-05-06 DIAGNOSIS — Z20828 Contact with and (suspected) exposure to other viral communicable diseases: Secondary | ICD-10-CM | POA: Diagnosis present

## 2019-05-06 DIAGNOSIS — J9621 Acute and chronic respiratory failure with hypoxia: Secondary | ICD-10-CM | POA: Diagnosis not present

## 2019-05-06 DIAGNOSIS — J84112 Idiopathic pulmonary fibrosis: Secondary | ICD-10-CM | POA: Diagnosis not present

## 2019-05-06 DIAGNOSIS — Z825 Family history of asthma and other chronic lower respiratory diseases: Secondary | ICD-10-CM

## 2019-05-06 DIAGNOSIS — I10 Essential (primary) hypertension: Secondary | ICD-10-CM | POA: Diagnosis not present

## 2019-05-06 DIAGNOSIS — Z79899 Other long term (current) drug therapy: Secondary | ICD-10-CM

## 2019-05-06 DIAGNOSIS — Z8261 Family history of arthritis: Secondary | ICD-10-CM

## 2019-05-06 DIAGNOSIS — I341 Nonrheumatic mitral (valve) prolapse: Secondary | ICD-10-CM | POA: Diagnosis present

## 2019-05-06 DIAGNOSIS — K219 Gastro-esophageal reflux disease without esophagitis: Secondary | ICD-10-CM | POA: Diagnosis present

## 2019-05-06 DIAGNOSIS — R05 Cough: Secondary | ICD-10-CM | POA: Diagnosis not present

## 2019-05-06 DIAGNOSIS — J849 Interstitial pulmonary disease, unspecified: Secondary | ICD-10-CM | POA: Diagnosis present

## 2019-05-06 DIAGNOSIS — K222 Esophageal obstruction: Secondary | ICD-10-CM | POA: Diagnosis present

## 2019-05-06 DIAGNOSIS — J984 Other disorders of lung: Secondary | ICD-10-CM

## 2019-05-06 LAB — CBC WITH DIFFERENTIAL/PLATELET
Abs Immature Granulocytes: 0.04 10*3/uL (ref 0.00–0.07)
Basophils Absolute: 0.1 10*3/uL (ref 0.0–0.1)
Basophils Relative: 1 %
Eosinophils Absolute: 0.3 10*3/uL (ref 0.0–0.5)
Eosinophils Relative: 2 %
HCT: 41.5 % (ref 36.0–46.0)
Hemoglobin: 12.9 g/dL (ref 12.0–15.0)
Immature Granulocytes: 0 %
Lymphocytes Relative: 7 %
Lymphs Abs: 1 10*3/uL (ref 0.7–4.0)
MCH: 29.1 pg (ref 26.0–34.0)
MCHC: 31.1 g/dL (ref 30.0–36.0)
MCV: 93.7 fL (ref 80.0–100.0)
Monocytes Absolute: 0.5 10*3/uL (ref 0.1–1.0)
Monocytes Relative: 3 %
Neutro Abs: 12 10*3/uL — ABNORMAL HIGH (ref 1.7–7.7)
Neutrophils Relative %: 87 %
Platelets: 314 10*3/uL (ref 150–400)
RBC: 4.43 MIL/uL (ref 3.87–5.11)
RDW: 13.5 % (ref 11.5–15.5)
WBC: 13.9 10*3/uL — ABNORMAL HIGH (ref 4.0–10.5)
nRBC: 0 % (ref 0.0–0.2)

## 2019-05-06 LAB — BASIC METABOLIC PANEL
Anion gap: 10 (ref 5–15)
BUN: 11 mg/dL (ref 8–23)
CO2: 29 mmol/L (ref 22–32)
Calcium: 8.9 mg/dL (ref 8.9–10.3)
Chloride: 100 mmol/L (ref 98–111)
Creatinine, Ser: 0.64 mg/dL (ref 0.44–1.00)
GFR calc Af Amer: >60 mL/min (ref >60)
GFR calc non Af Amer: >60 mL/min (ref >60)
Glucose, Bld: 120 mg/dL — ABNORMAL HIGH (ref 70–99)
Potassium: 4.2 mmol/L (ref 3.5–5.1)
Sodium: 139 mmol/L (ref 135–145)

## 2019-05-06 LAB — TROPONIN I (HIGH SENSITIVITY)
Troponin I (High Sensitivity): 7 ng/L (ref <18)
Troponin I (High Sensitivity): 6 ng/L (ref <18)

## 2019-05-06 MED ORDER — VENLAFAXINE HCL ER 75 MG PO CP24
75.0000 mg | ORAL_CAPSULE | Freq: Every day | ORAL | Status: DC
  Administered 2019-05-07 – 2019-05-09 (×4): 75 mg via ORAL
  Filled 2019-05-06 (×4): qty 1

## 2019-05-06 MED ORDER — AMITRIPTYLINE HCL 25 MG PO TABS
25.0000 mg | ORAL_TABLET | Freq: Every day | ORAL | Status: DC
  Administered 2019-05-07 – 2019-05-09 (×4): 25 mg via ORAL
  Filled 2019-05-06 (×4): qty 1

## 2019-05-06 MED ORDER — DEXTROMETHORPHAN POLISTIREX ER 30 MG/5ML PO SUER
15.0000 mg | ORAL | Status: DC | PRN
  Filled 2019-05-06: qty 5

## 2019-05-06 MED ORDER — MAGNESIUM SULFATE 2 GM/50ML IV SOLN
2.0000 g | Freq: Once | INTRAVENOUS | Status: AC
  Administered 2019-05-06: 2 g via INTRAVENOUS
  Filled 2019-05-06: qty 50

## 2019-05-06 MED ORDER — AEROCHAMBER PLUS FLO-VU MEDIUM MISC: 1.0000 | Freq: Once | Status: DC

## 2019-05-06 MED ORDER — ALBUTEROL SULFATE HFA 108 (90 BASE) MCG/ACT IN AERS: 2.0000 | INHALATION_SPRAY | Freq: Four times a day (QID) | RESPIRATORY_TRACT | Status: DC

## 2019-05-06 MED ORDER — POLYETHYLENE GLYCOL 3350 17 G PO PACK: 17.0000 g | PACK | Freq: Every day | ORAL | Status: DC | PRN

## 2019-05-06 MED ORDER — ALBUTEROL SULFATE HFA 108 (90 BASE) MCG/ACT IN AERS
8.0000 | INHALATION_SPRAY | Freq: Once | RESPIRATORY_TRACT | Status: AC
  Administered 2019-05-06: 8 via RESPIRATORY_TRACT
  Filled 2019-05-06: qty 6.7

## 2019-05-06 MED ORDER — ENOXAPARIN SODIUM 40 MG/0.4ML ~~LOC~~ SOLN
40.0000 mg | Freq: Every day | SUBCUTANEOUS | Status: DC
  Administered 2019-05-07 – 2019-05-09 (×4): 40 mg via SUBCUTANEOUS
  Filled 2019-05-06 (×4): qty 0.4

## 2019-05-06 MED ORDER — SODIUM CHLORIDE 0.9 % IV SOLN: 250.0000 mL | INTRAVENOUS | Status: DC | PRN

## 2019-05-06 MED ORDER — MONTELUKAST SODIUM 10 MG PO TABS
10.0000 mg | ORAL_TABLET | Freq: Every day | ORAL | Status: DC
  Administered 2019-05-07 – 2019-05-09 (×4): 10 mg via ORAL
  Filled 2019-05-06 (×4): qty 1

## 2019-05-06 MED ORDER — MOMETASONE FURO-FORMOTEROL FUM 100-5 MCG/ACT IN AERO
2.0000 | INHALATION_SPRAY | Freq: Two times a day (BID) | RESPIRATORY_TRACT | Status: DC
  Administered 2019-05-07 – 2019-05-10 (×7): 2 via RESPIRATORY_TRACT
  Filled 2019-05-06: qty 8.8

## 2019-05-06 MED ORDER — SODIUM CHLORIDE 0.9% FLUSH: 3.0000 mL | INTRAVENOUS | Status: DC | PRN

## 2019-05-06 MED ORDER — GABAPENTIN 300 MG PO CAPS
600.0000 mg | ORAL_CAPSULE | Freq: Every day | ORAL | Status: DC
  Administered 2019-05-07 – 2019-05-09 (×4): 600 mg via ORAL
  Filled 2019-05-06 (×4): qty 2

## 2019-05-06 MED ORDER — PANTOPRAZOLE SODIUM 40 MG PO TBEC
40.0000 mg | DELAYED_RELEASE_TABLET | Freq: Every day | ORAL | Status: DC
  Administered 2019-05-07 – 2019-05-08 (×2): 40 mg via ORAL
  Filled 2019-05-06: qty 1

## 2019-05-06 MED ORDER — SODIUM CHLORIDE 0.9% FLUSH
3.0000 mL | Freq: Two times a day (BID) | INTRAVENOUS | Status: DC
  Administered 2019-05-07 – 2019-05-10 (×6): 3 mL via INTRAVENOUS

## 2019-05-06 MED ORDER — METHYLPREDNISOLONE SODIUM SUCC 125 MG IJ SOLR
125.0000 mg | Freq: Once | INTRAMUSCULAR | Status: AC
  Administered 2019-05-06: 125 mg via INTRAVENOUS
  Filled 2019-05-06: qty 2

## 2019-05-06 MED ORDER — PRAVASTATIN SODIUM 20 MG PO TABS
20.0000 mg | ORAL_TABLET | Freq: Every evening | ORAL | Status: DC
  Administered 2019-05-07 – 2019-05-09 (×3): 20 mg via ORAL
  Filled 2019-05-06 (×3): qty 1

## 2019-05-06 MED ORDER — ACETAMINOPHEN 650 MG RE SUPP: 650.0000 mg | Freq: Four times a day (QID) | RECTAL | Status: DC | PRN

## 2019-05-06 MED ORDER — BENZONATATE 100 MG PO CAPS: 200.0000 mg | ORAL_CAPSULE | ORAL | Status: DC | PRN

## 2019-05-06 MED ORDER — ACETAMINOPHEN 325 MG PO TABS: 650.0000 mg | ORAL_TABLET | Freq: Four times a day (QID) | ORAL | Status: DC | PRN

## 2019-05-06 MED ORDER — LOSARTAN POTASSIUM 25 MG PO TABS
25.0000 mg | ORAL_TABLET | Freq: Every day | ORAL | Status: DC
  Administered 2019-05-07 – 2019-05-10 (×4): 25 mg via ORAL
  Filled 2019-05-06 (×4): qty 1

## 2019-05-06 MED ORDER — PREDNISONE 20 MG PO TABS
10.0000 mg | ORAL_TABLET | Freq: Every day | ORAL | Status: DC
  Administered 2019-05-07 – 2019-05-09 (×3): 10 mg via ORAL
  Filled 2019-05-06 (×3): qty 1

## 2019-05-06 NOTE — ED Notes (Signed)
ED TO INPATIENT HANDOFF REPORT  ED Nurse Name and Phone #: Jerene Yeager   S Name/Age/Gender Stacey [name] 78 y.o. female Room/Bed: WA24/WA24  Code Status   Code Status: Full Code  Home/SNF/Other Home Patient oriented to: self, place, time and situation Is this baseline? Yes   Triage Complete: Triage complete  Chief Complaint Shortness of Breath   Triage Note Pt arrives today c/o SHOB. Pt has hx of insterstitial lung disease, but states today is worse than normal. Pt wears O2 at home as needed, but still has labored breathing.   Allergies Allergies  Allergen Reactions  . Penicillins Other (See Comments)    Reaction unknown occurred during childhood Has patient had a PCN reaction causing immediate rash, facial/tongue/throat swelling, SOB or lightheadedness with hypotension: Unknown Has patient had a PCN reaction causing severe rash involving mucus membranes or skin necrosis: Unknown Has patient had a PCN reaction that required hospitalization: No Has patient had a PCN reaction occurring within the last 10 years: No If all of the above answers are "NO", then may proceed with Cephalosporin use.  Unsur  . Remicade [Infliximab] Other (See Comments)    Reaction unknown  . Sulfa Antibiotics Other (See Comments)    Reaction unknown  . Sulfamethoxazole Other (See Comments)    Unsure of reaction As a child not sure     Level of Care/Admitting Diagnosis ED Disposition    ED Disposition Condition [facility] Area: [place] [[id number]]  Level of Care: Med-Surg [16]  Covid Evaluation: Asymptomatic Screening Protocol (No Symptoms)  Diagnosis: Asthma exacerbation [id number]  Admitting Physician: Neena Rhymes [[id number]]  Attending Physician: Adella Hare E [[id number]]  PT Class (Do Not Modify): Observation [104]  PT Acc Code (Do Not Modify): Observation [10022]       B Medical/Surgery History Past Medical History:  Diagnosis Date  . Abnormal  finding of blood chemistry   . Asthma   . H/O measles   . H/O varicella   . Hypertension   . 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[place])   . 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 Lichen sclerosus [id number]   Asymptomatic  . Low iron   . Mitral valve prolapse   . Osteoarthritis   . Osteoporosis   . Pneumonia   . Post herpetic neuralgia   . Rheumatoid arthritis(714.0)   . Yeast infection    Past Surgical History:  Procedure Laterality Date  . CHOLECYSTECTOMY  2011  . WISDOM TOOTH EXTRACTION       A IV Location/Drains/Wounds Patient Lines/Drains/Airways Status   Active Line/Drains/Airways    Name:   Placement date:   Placement time:   Site:   Days:   Peripheral IV 05/06/19 Left Forearm   05/06/19    1716    Forearm   less than 1          Intake/Output Last 24 hours  Intake/Output Summary (Last 24 hours) at 05/06/2019 2351 Last data filed at 05/06/2019 2338 Gross per 24 hour  Intake 46.88 ml  Output -  Net 46.88 ml    Labs/Imaging Results for orders placed or performed during the hospital encounter of 05/06/19 (from the past 48 hour(s))  Basic metabolic panel     Status: Abnormal   Collection Time: 05/06/19  5:15 PM  Result Value Ref Range   Sodium 139 135 - 145 mmol/L   Potassium 4.2 3.5 - 5.1 mmol/L   Chloride 100 98 - 111 mmol/L   CO2  29 22 - 32 mmol/L   Glucose, Bld 120 (H) 70 - 99 mg/dL   BUN 11 8 - 23 mg/dL   Creatinine, Ser 0.64 0.44 - 1.00 mg/dL   Calcium 8.9 8.9 - 10.3 mg/dL   GFR calc non Af Amer >60 >60 mL/min   GFR calc Af Amer >60 >60 mL/min   Anion gap 10 5 - 15    Comment: Performed at [facility], [place] [address] [place], [place] [address]  CBC with Differential     Status: Abnormal   Collection Time: 05/06/19  5:15 PM  Result Value Ref Range   WBC 13.9 (H) 4.0 - 10.5 K/uL   RBC 4.43 3.87 - 5.11 MIL/uL   Hemoglobin 12.9 12.0 - 15.0 g/dL   HCT 41.5 36.0 - 46.0 %   MCV 93.7 80.0 - 100.0 fL   MCH 29.1 26.0 - 34.0 pg   MCHC 31.1 30.0 -  36.0 g/dL   RDW 13.5 11.5 - 15.5 %   Platelets 314 150 - 400 K/uL   nRBC 0.0 0.0 - 0.2 %   Neutrophils Relative % 87 %   Neutro Abs 12.0 (H) 1.7 - 7.7 K/uL   Lymphocytes Relative 7 %   Lymphs Abs 1.0 0.7 - 4.0 K/uL   Monocytes Relative 3 %   Monocytes Absolute 0.5 0.1 - 1.0 K/uL   Eosinophils Relative 2 %   Eosinophils Absolute 0.3 0.0 - 0.5 K/uL   Basophils Relative 1 %   Basophils Absolute 0.1 0.0 - 0.1 K/uL   Immature Granulocytes 0 %   Abs Immature Granulocytes 0.04 0.00 - 0.07 K/uL    Comment: Performed at [facility], [place] [address] [place], [place] [address]  Troponin I (High Sensitivity)     Status: None   Collection Time: 05/06/19  5:15 PM  Result Value Ref Range   Troponin I (High Sensitivity) 7 <18 ng/L    Comment: (NOTE) Elevated high sensitivity troponin I (hsTnI) values and significant  changes across serial measurements may suggest ACS but many other  chronic and acute conditions are known to elevate hsTnI results.  Refer to the "Links" section for chest pain algorithms and additional  guidance. Performed at [facility], [place] [address] [place], [place] [address]   Troponin I (High Sensitivity)     Status: None   Collection Time: 05/06/19  8:26 PM  Result Value Ref Range   Troponin I (High Sensitivity) 6 <18 ng/L    Comment: (NOTE) Elevated high sensitivity troponin I (hsTnI) values and significant  changes across serial measurements may suggest ACS but many other  chronic and acute conditions are known to elevate hsTnI results.  Refer to the "Links" section for chest pain algorithms and additional  guidance. Performed at [facility], [place] [address], [place], [place] [address]    Dg Chest 2 View  Result Date: 05/06/2019 CLINICAL DATA:  Shortness of breath and cough EXAM: CHEST - 2 VIEW COMPARISON:  CT 04/24/2019, radiograph 12/22/2018 FINDINGS: Coarse interstitial changes throughout the lungs  with biapical pleuroparenchymal scarring compatible with a UIP pattern better assessed on high-resolution CT exam performed 04/24/2019. No superimposed consolidative process is seen. No pneumothorax or visible effusion. The aorta is calcified and tortuous. Remaining cardiomediastinal contours are unremarkable. Degenerative changes are present in the imaged spine and shoulders. Nonspecific coarse calcification in the left axilla, unchanged from comparison exams. Surgical clips in the right upper quadrant. IMPRESSION: Coarse interstitial  changes throughout the lungs with biapical pleuroparenchymal scarring, compatible with a UIP pattern better assessed on high-resolution CT exam. No superimposed acute cardiopulmonary abnormality. Electronically Signed   By: Lovena Le M.D.   On: 05/06/2019 16:47    Pending Labs Unresulted Labs (From admission, onward)    Start     Ordered   05/13/19 0500  Creatinine, serum  (enoxaparin (LOVENOX)    CrCl >/= 30 ml/min)  Weekly,   R    Comments: while on enoxaparin therapy    05/06/19 2229   05/06/19 1704  SARS CORONAVIRUS 2 (TAT 6-24 HRS) Nasopharyngeal Nasopharyngeal Swab  (Asymptomatic/Tier 2 Patients Labs)  Once,   STAT    Question Answer Comment  Is this test for diagnosis or screening Screening   Symptomatic for COVID-19 as defined by CDC No   Hospitalized for COVID-19 No   Admitted to ICU for COVID-19 No   Previously tested for COVID-19 Yes   Resident in a congregate (group) care setting No   Employed in healthcare setting No   Pregnant No      05/06/19 1703   05/06/19 1651  Urinalysis, Routine w reflex microscopic  ONCE - STAT,   STAT     05/06/19 1650          Vitals/Pain Today's Vitals   05/06/19 1900 05/06/19 1930 05/06/19 2021 05/06/19 2336  BP: (!) 158/71 (!) 164/75 107/78 (!) 147/71  Pulse: 84 83 78 80  Resp: (!) 30 (!) 29 (!) 28 (!) 26  SpO2: 95% 94% 97% 94%  Weight:      PainSc:   0-No pain 0-No pain    Isolation Precautions No  active isolations  Medications Medications  losartan (COZAAR) tablet 25 mg (has no administration in time range)  pravastatin (PRAVACHOL) tablet 20 mg (has no administration in time range)  amitriptyline (ELAVIL) tablet 25 mg (has no administration in time range)  venlafaxine XR (EFFEXOR-XR) 24 hr capsule 75 mg (has no administration in time range)  predniSONE (DELTASONE) tablet 10 mg (has no administration in time range)  pantoprazole (PROTONIX) EC tablet 40 mg (has no administration in time range)  polyethylene glycol (MIRALAX / GLYCOLAX) packet 17 g (has no administration in time range)  gabapentin (NEURONTIN) capsule 600 mg (has no administration in time range)  benzonatate (TESSALON) capsule 200 mg (has no administration in time range)  dextromethorphan (DELSYM) 30 MG/5ML liquid 15 mg (has no administration in time range)  mometasone-formoterol (DULERA) 100-5 MCG/ACT inhaler 2 puff (2 puffs Inhalation Not Given 05/06/19 2336)  montelukast (SINGULAIR) tablet 10 mg (has no administration in time range)  enoxaparin (LOVENOX) injection 40 mg (has no administration in time range)  sodium chloride flush (NS) 0.9 % injection 3 mL (has no administration in time range)  sodium chloride flush (NS) 0.9 % injection 3 mL (has no administration in time range)  0.9 %  sodium chloride infusion (has no administration in time range)  acetaminophen (TYLENOL) tablet 650 mg (has no administration in time range)    Or  acetaminophen (TYLENOL) suppository 650 mg (has no administration in time range)  albuterol (VENTOLIN HFA) 108 (90 Base) MCG/ACT inhaler 2 puff (has no administration in time range)  AeroChamber Plus Flo-Vu Medium MISC 1 each (has no administration in time range)  albuterol (VENTOLIN HFA) 108 (90 Base) MCG/ACT inhaler 8 puff (8 puffs Inhalation Given 05/06/19 1706)  methylPREDNISolone sodium succinate (SOLU-MEDROL) 125 mg/2 mL injection 125 mg (125 mg Intravenous Given 05/06/19 1717)  magnesium sulfate IVPB 2 g 50 mL (0 g Intravenous Stopped 05/06/19 2338)    Mobility walks Low fall risk   Focused Assessments Cardiac Assessment Handoff:    Lab Results  Component Value Date   [id number] 08/20/2015   TROPONINI 1.17 ([place]) 12/20/2018   No results found for: DDIMER Does the Patient currently have chest pain? No  , Neuro Assessment Handoff:  Swallow screen pass? Yes          Neuro Assessment:   Neuro Checks:      Last Documented NIHSS Modified Score:   Has TPA been given? No If patient is a Neuro Trauma and patient is going to OR before floor call report to [place] nurse: [phone] or [phone]  , Pulmonary Assessment Handoff:  Lung sounds: Bilateral Breath Sounds: Expiratory wheezes L Breath Sounds: Expiratory wheezes R Breath Sounds: Expiratory wheezes O2 Device: Nasal Cannula O2 Flow Rate (L/min): 3 L/min      R Recommendations: See Admitting Provider Note  Report given to:   Additional Notes:

## 2019-05-06 NOTE — ED Notes (Signed)
Awaiting pharmacy verification of night time meds

## 2019-05-06 NOTE — ED Provider Notes (Signed)
[facility] DEPT Provider Note   CSN: [id number] Arrival date & time: 05/06/19  1554     History   Chief Complaint Chief Complaint  Patient presents with   Shortness of Breath    HPI Stacey [name] is a 78 y.o. female with PMHx asthma, ILD, HTN, and RA who presents to the ED complaining of worsening shortness of breath x 1-2 days.  She reports she noted she began wheezing a couple of days ago and feeling more short of breath especially with exertion.  She states that she has been using her albuterol inhaler and nebulizer treatments without relief.  Patient is prescribed steroids for her rheumatoid arthritis reports increasing her dose today without any relief as well.  She is typically on 2 L oxygen at nighttime but states she felt like she needed oxygen during the day today and placed herself on 2 L.  No recent sick contacts.  No COVID-19 positive exposure.  Denies fever, chills, sore throat, sinus pressure, chest pain, nausea, vomiting, diaphoresis, any other associated symptoms.  No history of DVT/PE.  No recent prolonged travel or immobilization. No exogenous hormones. No active malignancy.        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   Lichen sclerosus [id number]   Asymptomatic   Low iron    Mitral valve prolapse    Osteoarthritis    Osteoporosis    Pneumonia    Post herpetic neuralgia    Rheumatoid arthritis(714.0)    Yeast infection     Patient Active Problem List   Diagnosis Date Noted   Acute asthma exacerbation 12/18/2018   Elevated troponin 12/18/2018   Acute bronchitis 08/30/2018   Chronic respiratory failure with hypoxia ([place]) 04/06/2018   Acute respiratory failure with hypoxia ([place]) 03/26/2018   Essential hypertension 03/24/2018    Depression 03/24/2018   Asthma exacerbation 03/23/2018   DOE (dyspnea on exertion) 03/21/2018   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 esophagitis 03/21/2018   Chronic lung disease    Hypoxia    Dyspnea 01/23/2018   ILD (interstitial lung disease) ([place]) 01/23/2018   Claw toe, acquired, 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   Restrictive lung disease 07/18/2013   Multiple pulmonary nodules/RA lung dz  05/29/2013   Cough variant asthma vs uacs  05/29/2013   Rheumatoid arthritis ([place]) 01/13/2012   Osteopenia [id number]   Lichen sclerosus et atrophicus of the vulva 01/13/2012   Asthma 01/13/2012    Past Surgical History:  Procedure Laterality Date   CHOLECYSTECTOMY  2011   WISDOM TOOTH EXTRACTION       OB History    Gravida  2   Para  2   Term  2   Preterm      AB      Living  2     SAB      TAB      Ectopic      Multiple      Live Births  2            Home Medications    Prior to Admission medications   Medication Sig Start Date End Date Taking? Authorizing Provider  albuterol (VENTOLIN HFA) 108 (90 Base) MCG/ACT inhaler Inhale 2 puffs into the lungs every 6 (six) hours as needed for wheezing or shortness  of breath. Patient not taking: Reported on 03/08/2019 01/02/19   Fenton Foy, NP  amitriptyline (ELAVIL) 25 MG tablet Take 25 mg by mouth at bedtime. 02/04/15   [provider]  benzonatate (TESSALON) 200 MG capsule TAKE 1 CAPSULE BY MOUTH THREE TIMES DAILY AS NEEDED FOR COUGH. 02/22/19   Lauraine Rinne, NP  beta carotene w/minerals (OCUVITE) tablet Take 1 tablet by mouth daily.    [provider]  cholecalciferol (VITAMIN D) 1000 UNITS tablet Take 1,000 Units by mouth daily.    [provider]  dextromethorphan (DELSYM) 30 MG/5ML liquid Take 2.5 mLs (15 mg total) by mouth at bedtime. Patient taking differently: Take 15 mg by mouth as needed for cough.  01/27/18   Lavina Hamman, MD  doxycycline (VIBRA-TABS) 100 MG tablet Take 1 tablet (100 mg total) by  mouth 2 (two) times daily. 03/08/19   Martyn Ehrich, NP  gabapentin (NEURONTIN) 300 MG capsule Take 600 mg by mouth at bedtime.    [provider]  ipratropium-albuterol (DUONEB) 0.5-2.5 (3) MG/3ML SOLN Take 3 mLs by nebulization 3 (three) times daily. 12/24/18   Swayze, Ava, DO  losartan (COZAAR) 25 MG tablet Take 25 mg by mouth daily. 02/04/15   [provider]  methylPREDNISolone (MEDROL) 8 MG tablet Take 4 tabs x 2 days; then 2 tablets x 2 days; then 1 tab x 2 days 03/08/19   Martyn Ehrich, NP  mometasone-formoterol (DULERA) 100-5 MCG/ACT AERO Inhale 2 puffs into the lungs 2 (two) times daily. 01/02/19   Fenton Foy, NP  montelukast (SINGULAIR) 10 MG tablet TAKE (1) TABLET DAILY AT BEDTIME. 02/17/19   Fenton Foy, NP  pantoprazole (PROTONIX) 40 MG tablet Take 1 tablet (40 mg total) by mouth 2 (two) times daily before a meal. Patient taking differently: Take 40 mg by mouth daily.  07/18/18   Mauri Pole, MD  polyethylene glycol (MIRALAX / GLYCOLAX) packet Take 17 g by mouth daily. Patient taking differently: Take 17 g by mouth daily as needed (For constipation.).  01/27/18   Lavina Hamman, MD  pravastatin (PRAVACHOL) 20 MG tablet Take 1 tablet (20 mg total) by mouth every evening. 01/06/19 01/01/20  Buford Dresser, MD  predniSONE (DELTASONE) 10 MG tablet Take 4 tablets x 1 day, 3 tablets x 1 day, then 2 tablets x 1 day, then 1 tablet x 1 day, then 5 mg x 1 day. 03/27/19   Brand Males, MD  riTUXimab (RITUXAN) 100 MG/10ML injection Inject into the vein. Receives at [facility] but unsure of dose - no more doses for next 6 months as of 09/14/18    [provider]  venlafaxine XR (EFFEXOR-XR) 75 MG 24 hr capsule Take 75 mg by mouth at bedtime.     [provider]    Family History Family History  Problem Relation Age of Onset   Asthma Mother    Anemia Mother    Polymyalgia rheumatica Mother    COPD Father    Pulmonary fibrosis  Father    Breast cancer Maternal Grandmother 88    Social History Social History   Tobacco Use   Smoking status: Never Smoker   Smokeless tobacco: Never Used  Substance Use Topics   Alcohol use: No   Drug use: No     Allergies   Penicillins, Remicade [infliximab], Sulfa antibiotics, and Sulfamethoxazole   Review of Systems Review of Systems  Constitutional: Negative for chills and fever.  HENT: Negative for  congestion, ear pain and sore throat.   Eyes: Negative for visual disturbance.  Respiratory: Positive for cough, shortness of breath and wheezing.   Cardiovascular: Negative for chest pain and leg swelling.  Gastrointestinal: Negative for abdominal pain, nausea and vomiting.  Genitourinary: Negative for difficulty urinating.  Musculoskeletal: Negative for myalgias.  Skin: Negative for rash.  Neurological: Negative for headaches.     Physical Exam Updated Vital Signs BP 107/78 (BP Location: Right Arm)    Pulse 78    Resp (!) 28    Wt 63 kg    SpO2 97%    BMI 22.42 kg/m   Physical Exam Vitals signs and nursing note reviewed.  Constitutional:      Appearance: She is not diaphoretic.  HENT:     Head: Normocephalic and atraumatic.  Eyes:     Conjunctiva/sclera: Conjunctivae normal.  Neck:     Musculoskeletal: Normal range of motion and neck supple.  Cardiovascular:     Rate and Rhythm: Normal rate and regular rhythm.     Pulses: Normal pulses.  Pulmonary:     Effort: Tachypnea present.     Breath sounds: Wheezing present.  Chest:     Chest wall: No tenderness.  Abdominal:     Palpations: Abdomen is soft.     Tenderness: There is no abdominal tenderness. There is no guarding or rebound.  Musculoskeletal:     Right lower leg: No edema.     Left lower leg: No edema.  Lymphadenopathy:     Cervical: No cervical adenopathy.  Skin:    General: Skin is warm and dry.  Neurological:     Mental Status: She is alert.      ED Treatments / Results   Labs (all labs ordered are listed, but only abnormal results are displayed) Labs Reviewed  BASIC METABOLIC PANEL - Abnormal; Notable for the following components:      Result Value   Glucose, Bld 120 (*)    All other components within normal limits  CBC WITH DIFFERENTIAL/PLATELET - Abnormal; Notable for the following components:   WBC 13.9 (*)    Neutro Abs 12.0 (*)    All other components within normal limits  SARS CORONAVIRUS 2 (TAT 6-24 HRS)  URINALYSIS, ROUTINE W REFLEX MICROSCOPIC  TROPONIN I (HIGH SENSITIVITY)  TROPONIN I (HIGH SENSITIVITY)    EKG EKG Interpretation  Date/Time:  Saturday May 06 2019 17:03:58 EDT Ventricular Rate:  84 PR Interval:    QRS Duration: 90 QT Interval:  397 QTC Calculation: 470 R Axis:   27 Text Interpretation:  Sinus rhythm no acute ST/T changes no signifincant change since June 2020 Confirmed by Sherwood Gambler [phone]) on 05/06/2019 6:17:51 PM   Radiology Dg Chest 2 View  Result Date: 05/06/2019 CLINICAL DATA:  Shortness of breath and cough EXAM: CHEST - 2 VIEW COMPARISON:  CT 04/24/2019, radiograph 12/22/2018 FINDINGS: Coarse interstitial changes throughout the lungs with biapical pleuroparenchymal scarring compatible with a UIP pattern better assessed on high-resolution CT exam performed 04/24/2019. No superimposed consolidative process is seen. No pneumothorax or visible effusion. The aorta is calcified and tortuous. Remaining cardiomediastinal contours are unremarkable. Degenerative changes are present in the imaged spine and shoulders. Nonspecific coarse calcification in the left axilla, unchanged from comparison exams. Surgical clips in the right upper quadrant. IMPRESSION: Coarse interstitial changes throughout the lungs with biapical pleuroparenchymal scarring, compatible with a UIP pattern better assessed on high-resolution CT exam. No superimposed acute cardiopulmonary abnormality. Electronically Signed   By: March Rummage  [facility] M.D.   On:  05/06/2019 16:47    Procedures Procedures (including critical care time)  Medications Ordered in ED Medications  losartan (COZAAR) tablet 25 mg (has no administration in time range)  pravastatin (PRAVACHOL) tablet 20 mg (has no administration in time range)  amitriptyline (ELAVIL) tablet 25 mg (has no administration in time range)  venlafaxine XR (EFFEXOR-XR) 24 hr capsule 75 mg (has no administration in time range)  predniSONE (DELTASONE) tablet 10 mg (has no administration in time range)  pantoprazole (PROTONIX) EC tablet 40 mg (has no administration in time range)  polyethylene glycol (MIRALAX / GLYCOLAX) packet 17 g (has no administration in time range)  gabapentin (NEURONTIN) capsule 600 mg (has no administration in time range)  benzonatate (TESSALON) capsule 200 mg (has no administration in time range)  dextromethorphan (DELSYM) 30 MG/5ML liquid 15 mg (has no administration in time range)  mometasone-formoterol (DULERA) 100-5 MCG/ACT inhaler 2 puff (has no administration in time range)  montelukast (SINGULAIR) tablet 10 mg (has no administration in time range)  enoxaparin (LOVENOX) injection 40 mg (has no administration in time range)  sodium chloride flush (NS) 0.9 % injection 3 mL (has no administration in time range)  sodium chloride flush (NS) 0.9 % injection 3 mL (has no administration in time range)  0.9 %  sodium chloride infusion (has no administration in time range)  acetaminophen (TYLENOL) tablet 650 mg (has no administration in time range)    Or  acetaminophen (TYLENOL) suppository 650 mg (has no administration in time range)  albuterol (VENTOLIN HFA) 108 (90 Base) MCG/ACT inhaler 2 puff (has no administration in time range)  AeroChamber Plus Flo-Vu Medium MISC 1 each (has no administration in time range)  albuterol (VENTOLIN HFA) 108 (90 Base) MCG/ACT inhaler 8 puff (8 puffs Inhalation Given 05/06/19 1706)  methylPREDNISolone sodium succinate (SOLU-MEDROL) 125 mg/2 mL  injection 125 mg (125 mg Intravenous Given 05/06/19 1717)  magnesium sulfate IVPB 2 g 50 mL (2 g Intravenous New Bag/Given 05/06/19 1726)     Initial Impression / Assessment and Plan / ED Course  I have reviewed the triage vital signs and the nursing notes.  Pertinent labs & imaging results that were available during my care of the patient were reviewed by me and considered in my medical decision making (see chart for details).  78 year old female presents the ED with worsening shortness of breath.  History of interstitial lung disease.  On exam patient is wheezing and appears tachypneic.  She appears to be using accessory muscle use.  Has already used nebulizer treatment at home without relief is on chronic steroids for rheumatoid arthritis without relief.  Only on 2 L of oxygen at home only at nighttime.  She placed herself on 2 L of O2 today with worsening shortness of breath.  She is currently satting above 95% on 2 L.  Try to wean off patient dips down to 90% on room air.  To obtain baseline screening labs as well as chest x-ray today.  Swab for Covid at this time and patient may need to be admitted.  Treat with 8 puffs albuterol inhaler as well as Solu-Medrol and mag.   X-ray unchanged from previous CT scan done on 10/12.  KG without ischemic changes.  Initial troponin of 6.  Will repeat.  She does have leukocytosis at 13,900 but again she is chronically elevated with steroid use.  No electrolyte abnormalities today.   On reevaluation patient states she feels mildly improved with albuterol.  She is currently still wheezing.  Will give another couple puffs of albuterol.  I have discussed admission with patient at this time and she is in agreement with plan.  Will consult hospitalist for admission.   Clinical Course as of May 05 2236  Sat May 06, 2019  2040 On chronic steroids  WBC(!): 13.9 [MV]  2134 Discussed case with hospitalist Dr. Linda Hedges who will come evaluate patient - he is unsure if  she needs to be admitted.    [MV]    Clinical Course User Index [MV] Eustaquio Maize, PA-C   Dr. Linda Hedges has admitted patient.   This note was prepared using Dragon voice recognition software and may include unintentional dictation errors due to the inherent limitations of voice recognition software.       Final Clinical Impressions(s) / ED Diagnoses   Final diagnoses:  ILD (interstitial lung disease) ([place])  Shortness of breath    ED Discharge Orders    None       Eustaquio Maize, PA-C 05/06/19 2238    Sherwood Gambler, MD 05/07/19 [phone]

## 2019-05-06 NOTE — H&P (Signed)
History and Physical    Stacey [name] [id number] DOB: May 10, 1941 DOA: 05/06/2019  PCP: Shon Baton, MD (Confirm with patient/family/NH records and if not entered, this has to be entered at [facility] point of entry) Patient coming from: Patient is coming from home I have personally briefly reviewed patient's old medical records in [place]  Chief Complaint: Wheezing and shortness of breath  HPI: Stacey [name] is a 78 y.o. female with medical history significant of ILD, asthma, rheumatoid arthritis, hypertension.  She is followed by Dr. Chase Caller for pulmonary.  Last office visit was April 02, 2019.  At that time she had an exacerbation of her asthma and was treated with a prednisone burst and taper.  Since that visit she has had a ENT consult for chronic cough which was unrevealing.  She has had high-resolution CT of the chest April 24, 2019 which showed usual interstitial disease sister with rheumatoid arthritis and ILD.  Patient reports that over the past several days she has had increasing shortness of breath and wheezing.  Routinely she uses oxygen 2 L by nasal cannula at night has started using a cannula oxygen throughout the day.  She is used her Laurence Ferrari continue to have wheezing.  Because of her persistent symptoms she presents to the emergency department for evaluation.   ED Course: Patient is hemodynamically stable.  She had persistent wheezing despite multiple albuterol treatments.  Patient also continues to complain of being short of breath although her oxygen saturation is 95% on 2 L of nasal cannula oxygen.  Because of her history of ILD and asthma she is referred to [facility] for sedation admission.  Review of Systems: As per HPI otherwise 10 point review of systems negative.    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 She has never used smokeless tobacco. She reports that she does not drink alcohol or use drugs.  Allergies  Allergen Reactions   Penicillins Other (See Comments)    Reaction unknown occurred during childhood Has patient had a PCN reaction causing immediate rash, facial/tongue/throat swelling, SOB or lightheadedness with hypotension: Unknown Has patient had a PCN reaction causing severe rash involving mucus membranes or skin necrosis: Unknown Has patient had a PCN reaction that required hospitalization: No Has patient had a PCN reaction occurring within the last 10 years: No If all of the above answers are "NO", then may proceed with Cephalosporin use.  Unsur   Remicade [Infliximab] Other (See Comments)    Reaction unknown   Sulfa Antibiotics Other (See Comments)    Reaction unknown   Sulfamethoxazole Other (See Comments)    Unsure of reaction As a child not sure     Family History  Problem Relation Age of Onset   Asthma Mother    Anemia Mother    Polymyalgia rheumatica  Mother    COPD Father    Pulmonary fibrosis Father    Breast cancer Maternal Grandmother 88   Unacceptable: Noncontributory, unremarkable, or negative. Acceptable: Family history reviewed and not pertinent (If you reviewed it)  Prior to Admission medications   Medication Sig Start Date End Date Taking? Authorizing Provider  albuterol (VENTOLIN HFA) 108 (90 Base) MCG/ACT inhaler Inhale 2 puffs into the lungs every 6 (six) hours as needed for wheezing or shortness of breath. Patient not taking: Reported on 03/08/2019  01/02/19   Fenton Foy, NP  amitriptyline (ELAVIL) 25 MG tablet Take 25 mg by mouth at bedtime. 02/04/15   [provider]  benzonatate (TESSALON) 200 MG capsule TAKE 1 CAPSULE BY MOUTH THREE TIMES DAILY AS NEEDED FOR COUGH. 02/22/19   Lauraine Rinne, NP  beta carotene w/minerals (OCUVITE) tablet Take 1 tablet by mouth daily.    [provider]  cholecalciferol (VITAMIN D) 1000 UNITS tablet Take 1,000 Units by mouth daily.    [provider]  dextromethorphan (DELSYM) 30 MG/5ML liquid Take 2.5 mLs (15 mg total) by mouth at bedtime. Patient taking differently: Take 15 mg by mouth as needed for cough.  01/27/18   Lavina Hamman, MD  doxycycline (VIBRA-TABS) 100 MG tablet Take 1 tablet (100 mg total) by mouth 2 (two) times daily. 03/08/19   Martyn Ehrich, NP  gabapentin (NEURONTIN) 300 MG capsule Take 600 mg by mouth at bedtime.    [provider]  ipratropium-albuterol (DUONEB) 0.5-2.5 (3) MG/3ML SOLN Take 3 mLs by nebulization 3 (three) times daily. 12/24/18   Swayze, Ava, DO  losartan (COZAAR) 25 MG tablet Take 25 mg by mouth daily. 02/04/15   [provider]  methylPREDNISolone (MEDROL) 8 MG tablet Take 4 tabs x 2 days; then 2 tablets x 2 days; then 1 tab x 2 days 03/08/19   Martyn Ehrich, NP  mometasone-formoterol (DULERA) 100-5 MCG/ACT AERO Inhale 2 puffs into the lungs 2 (two) times daily. 01/02/19   Fenton Foy, NP  montelukast (SINGULAIR) 10 MG tablet TAKE (1) TABLET DAILY AT BEDTIME. 02/17/19   Fenton Foy, NP  pantoprazole (PROTONIX) 40 MG tablet Take 1 tablet (40 mg total) by mouth 2 (two) times daily before a meal. Patient taking differently: Take 40 mg by mouth daily.  07/18/18   Mauri Pole, MD  polyethylene glycol (MIRALAX / GLYCOLAX) packet Take 17 g by mouth daily. Patient taking differently: Take 17 g by mouth daily as needed (For constipation.).  01/27/18   Lavina Hamman, MD  pravastatin (PRAVACHOL) 20 MG tablet Take  1 tablet (20 mg total) by mouth every evening. 01/06/19 01/01/20  Buford Dresser, MD  predniSONE (DELTASONE) 10 MG tablet Take 4 tablets x 1 day, 3 tablets x 1 day, then 2 tablets x 1 day, then 1 tablet x 1 day, then 5 mg x 1 day. 03/27/19   Brand Males, MD  riTUXimab (RITUXAN) 100 MG/10ML injection Inject into the vein. Receives at [facility] but unsure of dose - no more doses for next 6 months as of 09/14/18    [provider]  venlafaxine XR (EFFEXOR-XR) 75 MG 24 hr capsule Take 75 mg by mouth at bedtime.     [provider]    Physical Exam: Vitals:   05/06/19 1830 05/06/19 1900 05/06/19 1930 05/06/19 2021  BP: (!) 162/79 (!) 158/71 (!) 164/75 107/78  Pulse: 83 84 83 78  Resp: (!) 26 (!) 30 (!)  29 (!) 28  SpO2: 95% 95% 94% 97%  Weight:        Constitutional: NAD, calm, comfortable Vitals:   05/06/19 1830 05/06/19 1900 05/06/19 1930 05/06/19 2021  BP: (!) 162/79 (!) 158/71 (!) 164/75 107/78  Pulse: 83 84 83 78  Resp: (!) 26 (!) 30 (!) 29 (!) 28  SpO2: 95% 95% 94% 97%  Weight:       Eyes: PERRL, lids and conjunctivae normal ENMT: Mucous membranes are moist. Posterior pharynx clear of any exudate or lesions.Normal dentition.  Neck: normal, supple, no masses, no thyromegaly Respiratory: clear to auscultation bilaterally, no wheezing, no crackles. Normal respiratory effort. No accessory muscle use.  Cardiovascular: Regular rate and rhythm, no murmurs / rubs / gallops. No extremity edema. 2+ pedal pulses. No carotid bruits.  Abdomen: no tenderness, no masses palpated. No hepatosplenomegaly. Bowel sounds positive.  Musculoskeletal: no clubbing / cyanosis. No joint deformity upper and lower extremities. Good ROM, no contractures. Normal muscle tone.  Skin: no rashes, lesions, ulcers. No induration Neurologic: CN 2-12 grossly intact. Sensation intact, DTR normal. Strength 5/5 in all 4.  Psychiatric: Normal judgment and insight. Alert and oriented x 3. Normal mood.    (Anything < 9 systems with 2 bullets each down codes to level 1) (If patient refuses exam cant bill higher level) (Make sure to document decubitus ulcers present on admission -- if possible -- and whether patient has chronic indwelling catheter at time of admission)  Labs on Admission: I have personally reviewed following labs and imaging studies  CBC: Recent Labs  Lab 05/06/19 1715  WBC 13.9*  NEUTROABS 12.0*  HGB 12.9  HCT 41.5  MCV 93.7  PLT [id number]   Basic Metabolic Panel: Recent Labs  Lab 05/06/19 1715  NA 139  K 4.2  CL 100  CO2 29  GLUCOSE 120*  BUN 11  CREATININE 0.64  CALCIUM 8.9   GFR: Estimated Creatinine Clearance: 53.4 mL/min (by C-G formula based on SCr of 0.64 mg/dL). Liver Function Tests: No results for input(s): AST, ALT, ALKPHOS, BILITOT, PROT, ALBUMIN in the last 168 hours. No results for input(s): LIPASE, AMYLASE in the last 168 hours. No results for input(s): AMMONIA in the last 168 hours. Coagulation Profile: No results for input(s): INR, PROTIME in the last 168 hours. Cardiac Enzymes: No results for input(s): CKTOTAL, CKMB, CKMBINDEX, TROPONINI in the last 168 hours. BNP (last 3 results) No results for input(s): PROBNP in the last 8760 hours. HbA1C: No results for input(s): HGBA1C in the last 72 hours. CBG: No results for input(s): GLUCAP in the last 168 hours. Lipid Profile: No results for input(s): CHOL, HDL, LDLCALC, TRIG, CHOLHDL, LDLDIRECT in the last 72 hours. Thyroid Function Tests: No results for input(s): TSH, T4TOTAL, FREET4, T3FREE, THYROIDAB in the last 72 hours. Anemia Panel: No results for input(s): VITAMINB12, FOLATE, FERRITIN, TIBC, IRON, RETICCTPCT in the last 72 hours. Urine analysis:    Component Value Date/Time   COLORURINE YELLOW 12/18/2009 0120   APPEARANCEUR CLEAR 12/18/2009 0120   LABSPEC 1.006 12/18/2009 0120   PHURINE 7.0 12/18/2009 0120   GLUCOSEU NEGATIVE 12/18/2009 0120   HGBUR NEGATIVE 12/18/2009 0120    BILIRUBINUR NEGATIVE 12/18/2009 0120   KETONESUR NEGATIVE 12/18/2009 0120   PROTEINUR NEGATIVE 12/18/2009 0120   UROBILINOGEN 0.2 12/18/2009 0120   NITRITE NEGATIVE 12/18/2009 0120   LEUKOCYTESUR TRACE (A) 12/18/2009 0120    Radiological Exams on Admission: Dg Chest 2 View  Result Date: 05/06/2019 CLINICAL DATA:  Shortness of breath  and cough EXAM: CHEST - 2 VIEW COMPARISON:  CT 04/24/2019, radiograph 12/22/2018 FINDINGS: Coarse interstitial changes throughout the lungs with biapical pleuroparenchymal scarring compatible with a UIP pattern better assessed on high-resolution CT exam performed 04/24/2019. No superimposed consolidative process is seen. No pneumothorax or visible effusion. The aorta is calcified and tortuous. Remaining cardiomediastinal contours are unremarkable. Degenerative changes are present in the imaged spine and shoulders. Nonspecific coarse calcification in the left axilla, unchanged from comparison exams. Surgical clips in the right upper quadrant. IMPRESSION: Coarse interstitial changes throughout the lungs with biapical pleuroparenchymal scarring, compatible with a UIP pattern better assessed on high-resolution CT exam. No superimposed acute cardiopulmonary abnormality. Electronically Signed   By: Lovena Le M.D.   On: 05/06/2019 16:47    EKG: Independently reviewed.  EKG reveals a sinus rhythm and otherwise unremarkable  Assessment/Plan Active Problems:   Rheumatoid arthritis (HCC)   Asthma   Restrictive lung disease   ILD (interstitial lung disease) ([place])   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 esophagitis   Asthma exacerbation   Essential hypertension  (please populate well all problems here in Problem List. (For example, if patient is on BP meds at home and you resume or decide to hold them, it is a problem that needs to be her. Same for CAD, COPD, HLD and so on)   1.  Pulmonary -patient with a history of progressive ILD followed by pulmonology.  She also has chronic asthma with  periodic flares.  Has had multiple hospitalizations most recently in June 2020.  She has never required mechanical ventilation.  Presents with a flare of shortness of breath and wheezing. Plan sedation admission  Prednisone burst and taper  Continuous oxygen by nasal cannula 2 L  Albuterol metered-dose inhaler with AeroChamber every 6 hours  IV hydration 2.  RA -she with longstanding RA.  She takes Rituxan every 6 months.  She is currently stable with no flare.  We will hold Medrol while getting a prednisone taper  3.  Chronic cough -multi- factorial.  She has been seen by ENT and has no vocal cord problems or other ENT problems.  Her cough is a combination of problems related to her asthma ILD and reflux.  She is continuing to have a cough generally is well controlled with before meal PPI Plan continue her home regimen  4.  Hypertension -continue her home regimen.  5.  End-of-life issues -discussed CODE STATUS with the patient.  This time she prefers to be a full code.  He is provided with a referral to [facility].  The conversation [email or web address]  She is instructed to discuss life issues with Dr. Chase Caller at her upcoming office visit  DVT prophylaxis: Lovenox (Lovenox/Heparin/SCD's/anticoagulated/None (if comfort care) Code Status: Full code (Full/Partial (specify details) Family Communication: Patient did not want her husband bothered this late at night (Specify name, relationship. Do not write "discussed with patient". Specify tel # if discussed over the phone) Disposition Plan: Home in 24 hours (specify when and where you expect patient to be discharged) Consults called: None (with names) Admission status: Observation (inpatient / obs / tele / medical floor / SDU)   Adella Hare MD [facility] Pager [phone]  If 7PM-7AM, please contact night-coverage [email or web address] Password [email or web address]  05/06/2019, 10:30 PM

## 2019-05-06 NOTE — ED Notes (Signed)
ED TO INPATIENT HANDOFF REPORT  ED Nurse Name and Phone #: Fredonia Highland [phone]  S Name/Age/Gender Stacey [name] 78 y.o. female Room/Bed: WA24/WA24  Code Status   Code Status: Full Code  Home/SNF/Other Home Patient oriented to: self, place, time and situation Is this baseline? Yes   Triage Complete: Triage complete  Chief Complaint Shortness of Breath   Triage Note Pt arrives today c/o SHOB. Pt has hx of insterstitial lung disease, but states today is worse than normal. Pt wears O2 at home as needed, but still has labored breathing.   Allergies Allergies  Allergen Reactions  . Penicillins Other (See Comments)    Reaction unknown occurred during childhood Has patient had a PCN reaction causing immediate rash, facial/tongue/throat swelling, SOB or lightheadedness with hypotension: Unknown Has patient had a PCN reaction causing severe rash involving mucus membranes or skin necrosis: Unknown Has patient had a PCN reaction that required hospitalization: No Has patient had a PCN reaction occurring within the last 10 years: No If all of the above answers are "NO", then may proceed with Cephalosporin use.  Unsur  . Remicade [Infliximab] Other (See Comments)    Reaction unknown  . Sulfa Antibiotics Other (See Comments)    Reaction unknown  . Sulfamethoxazole Other (See Comments)    Unsure of reaction As a child not sure     Level of Care/Admitting Diagnosis ED Disposition    ED Disposition Condition [facility] Area: [place] [[id number]]  Level of Care: Med-Surg [16]  Covid Evaluation: Asymptomatic Screening Protocol (No Symptoms)  Diagnosis: Asthma exacerbation [id number]  Admitting Physician: Neena Rhymes [[id number]]  Attending Physician: Adella Hare E [[id number]]  PT Class (Do Not Modify): Observation [104]  PT Acc Code (Do Not Modify): Observation [10022]       B Medical/Surgery History Past Medical History:  Diagnosis  Date  . Abnormal finding of blood chemistry   . Asthma   . H/O measles   . H/O varicella   . Hypertension   . 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[place])   . 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 Lichen sclerosus [id number]   Asymptomatic  . Low iron   . Mitral valve prolapse   . Osteoarthritis   . Osteoporosis   . Pneumonia   . Post herpetic neuralgia   . Rheumatoid arthritis(714.0)   . Yeast infection    Past Surgical History:  Procedure Laterality Date  . CHOLECYSTECTOMY  2011  . WISDOM TOOTH EXTRACTION       A IV Location/Drains/Wounds Patient Lines/Drains/Airways Status   Active Line/Drains/Airways    Name:   Placement date:   Placement time:   Site:   Days:   Peripheral IV 05/06/19 Left Forearm   05/06/19    1716    Forearm   less than 1          Intake/Output Last 24 hours  Intake/Output Summary (Last 24 hours) at 05/06/2019 2357 Last data filed at 05/06/2019 2338 Gross per 24 hour  Intake 46.88 ml  Output -  Net 46.88 ml    Labs/Imaging Results for orders placed or performed during the hospital encounter of 05/06/19 (from the past 48 hour(s))  Basic metabolic panel     Status: Abnormal   Collection Time: 05/06/19  5:15 PM  Result Value Ref Range   Sodium 139 135 - 145 mmol/L   Potassium 4.2 3.5 - 5.1 mmol/L   Chloride 100 98 - 111 mmol/L  CO2 29 22 - 32 mmol/L   Glucose, Bld 120 (H) 70 - 99 mg/dL   BUN 11 8 - 23 mg/dL   Creatinine, Ser 0.64 0.44 - 1.00 mg/dL   Calcium 8.9 8.9 - 10.3 mg/dL   GFR calc non Af Amer >60 >60 mL/min   GFR calc Af Amer >60 >60 mL/min   Anion gap 10 5 - 15    Comment: Performed at [facility], [place] [address], [place], [place] [address]  CBC with Differential     Status: Abnormal   Collection Time: 05/06/19  5:15 PM  Result Value Ref Range   WBC 13.9 (H) 4.0 - 10.5 K/uL   RBC 4.43 3.87 - 5.11 MIL/uL   Hemoglobin 12.9 12.0 - 15.0 g/dL   HCT 41.5 36.0 - 46.0 %   MCV 93.7 80.0 - 100.0 fL   MCH 29.1 26.0 - 34.0 pg    MCHC 31.1 30.0 - 36.0 g/dL   RDW 13.5 11.5 - 15.5 %   Platelets 314 150 - 400 K/uL   nRBC 0.0 0.0 - 0.2 %   Neutrophils Relative % 87 %   Neutro Abs 12.0 (H) 1.7 - 7.7 K/uL   Lymphocytes Relative 7 %   Lymphs Abs 1.0 0.7 - 4.0 K/uL   Monocytes Relative 3 %   Monocytes Absolute 0.5 0.1 - 1.0 K/uL   Eosinophils Relative 2 %   Eosinophils Absolute 0.3 0.0 - 0.5 K/uL   Basophils Relative 1 %   Basophils Absolute 0.1 0.0 - 0.1 K/uL   Immature Granulocytes 0 %   Abs Immature Granulocytes 0.04 0.00 - 0.07 K/uL    Comment: Performed at [facility], [place] [address], [place], [place] [address]  Troponin I (High Sensitivity)     Status: None   Collection Time: 05/06/19  5:15 PM  Result Value Ref Range   Troponin I (High Sensitivity) 7 <18 ng/L    Comment: (NOTE) Elevated high sensitivity troponin I (hsTnI) values and significant  changes across serial measurements may suggest ACS but many other  chronic and acute conditions are known to elevate hsTnI results.  Refer to the "Links" section for chest pain algorithms and additional  guidance. Performed at [facility], [place] [address] [place], [place] [address]   Troponin I (High Sensitivity)     Status: None   Collection Time: 05/06/19  8:26 PM  Result Value Ref Range   Troponin I (High Sensitivity) 6 <18 ng/L    Comment: (NOTE) Elevated high sensitivity troponin I (hsTnI) values and significant  changes across serial measurements may suggest ACS but many other  chronic and acute conditions are known to elevate hsTnI results.  Refer to the "Links" section for chest pain algorithms and additional  guidance. Performed at [facility], [place] [address] [place], [place] [address]    Dg Chest 2 View  Result Date: 05/06/2019 CLINICAL DATA:  Shortness of breath and cough EXAM: CHEST - 2 VIEW COMPARISON:  CT 04/24/2019, radiograph 12/22/2018 FINDINGS: Coarse interstitial changes  throughout the lungs with biapical pleuroparenchymal scarring compatible with a UIP pattern better assessed on high-resolution CT exam performed 04/24/2019. No superimposed consolidative process is seen. No pneumothorax or visible effusion. The aorta is calcified and tortuous. Remaining cardiomediastinal contours are unremarkable. Degenerative changes are present in the imaged spine and shoulders. Nonspecific coarse calcification in the left axilla, unchanged from comparison exams. Surgical clips in the right upper quadrant. IMPRESSION: Coarse  interstitial changes throughout the lungs with biapical pleuroparenchymal scarring, compatible with a UIP pattern better assessed on high-resolution CT exam. No superimposed acute cardiopulmonary abnormality. Electronically Signed   By: Lovena Le M.D.   On: 05/06/2019 16:47    Pending Labs Unresulted Labs (From admission, onward)    Start     Ordered   05/13/19 0500  Creatinine, serum  (enoxaparin (LOVENOX)    CrCl >/= 30 ml/min)  Weekly,   R    Comments: while on enoxaparin therapy    05/06/19 2229   05/06/19 1704  SARS CORONAVIRUS 2 (TAT 6-24 HRS) Nasopharyngeal Nasopharyngeal Swab  (Asymptomatic/Tier 2 Patients Labs)  Once,   STAT    Question Answer Comment  Is this test for diagnosis or screening Screening   Symptomatic for COVID-19 as defined by CDC No   Hospitalized for COVID-19 No   Admitted to ICU for COVID-19 No   Previously tested for COVID-19 Yes   Resident in a congregate (group) care setting No   Employed in healthcare setting No   Pregnant No      05/06/19 1703   05/06/19 1651  Urinalysis, Routine w reflex microscopic  ONCE - STAT,   STAT     05/06/19 1650          Vitals/Pain Today's Vitals   05/06/19 1900 05/06/19 1930 05/06/19 2021 05/06/19 2336  BP: (!) 158/71 (!) 164/75 107/78 (!) 147/71  Pulse: 84 83 78 80  Resp: (!) 30 (!) 29 (!) 28 (!) 26  SpO2: 95% 94% 97% 94%  Weight:      PainSc:   0-No pain 0-No pain     Isolation Precautions No active isolations  Medications Medications  losartan (COZAAR) tablet 25 mg (has no administration in time range)  pravastatin (PRAVACHOL) tablet 20 mg (has no administration in time range)  amitriptyline (ELAVIL) tablet 25 mg (has no administration in time range)  venlafaxine XR (EFFEXOR-XR) 24 hr capsule 75 mg (has no administration in time range)  predniSONE (DELTASONE) tablet 10 mg (has no administration in time range)  pantoprazole (PROTONIX) EC tablet 40 mg (has no administration in time range)  polyethylene glycol (MIRALAX / GLYCOLAX) packet 17 g (has no administration in time range)  gabapentin (NEURONTIN) capsule 600 mg (has no administration in time range)  benzonatate (TESSALON) capsule 200 mg (has no administration in time range)  dextromethorphan (DELSYM) 30 MG/5ML liquid 15 mg (has no administration in time range)  mometasone-formoterol (DULERA) 100-5 MCG/ACT inhaler 2 puff (2 puffs Inhalation Not Given 05/06/19 2336)  montelukast (SINGULAIR) tablet 10 mg (has no administration in time range)  enoxaparin (LOVENOX) injection 40 mg (has no administration in time range)  sodium chloride flush (NS) 0.9 % injection 3 mL (has no administration in time range)  sodium chloride flush (NS) 0.9 % injection 3 mL (has no administration in time range)  0.9 %  sodium chloride infusion (has no administration in time range)  acetaminophen (TYLENOL) tablet 650 mg (has no administration in time range)    Or  acetaminophen (TYLENOL) suppository 650 mg (has no administration in time range)  albuterol (VENTOLIN HFA) 108 (90 Base) MCG/ACT inhaler 2 puff (has no administration in time range)  AeroChamber Plus Flo-Vu Medium MISC 1 each (has no administration in time range)  albuterol (VENTOLIN HFA) 108 (90 Base) MCG/ACT inhaler 8 puff (8 puffs Inhalation Given 05/06/19 1706)  methylPREDNISolone sodium succinate (SOLU-MEDROL) 125 mg/2 mL injection 125 mg (125 mg  Intravenous Given 05/06/19 1717)  magnesium sulfate IVPB 2 g 50 mL (0 g Intravenous Stopped 05/06/19 2338)    Mobility walks with person assist Low fall risk   Focused Assessments Pulmonary Assessment Handoff:  Lung sounds: Bilateral Breath Sounds: Expiratory wheezes L Breath Sounds: Expiratory wheezes R Breath Sounds: Expiratory wheezes O2 Device: Nasal Cannula O2 Flow Rate (L/min): 3 L/min      R Recommendations: See Admitting Provider Note  Report given to:   Additional Notes:

## 2019-05-06 NOTE — ED Notes (Signed)
Provided pt with crackers and cheese and ginger ale.

## 2019-05-06 NOTE — ED Notes (Signed)
Patient transported to X-ray 

## 2019-05-06 NOTE — ED Notes (Signed)
Purwic placed on pt. Will collect urine when pt. Voids. Nurse aware.

## 2019-05-06 NOTE — ED Triage Notes (Signed)
Pt arrives today c/o SHOB. Pt has hx of insterstitial lung disease, but states today is worse than normal. Pt wears O2 at home as needed, but still has labored breathing.

## 2019-05-07 DIAGNOSIS — E785 Hyperlipidemia, unspecified: Secondary | ICD-10-CM | POA: Diagnosis present

## 2019-05-07 DIAGNOSIS — Z882 Allergy status to sulfonamides status: Secondary | ICD-10-CM | POA: Diagnosis not present

## 2019-05-07 DIAGNOSIS — M069 Rheumatoid arthritis, unspecified: Secondary | ICD-10-CM | POA: Diagnosis present

## 2019-05-07 DIAGNOSIS — I341 Nonrheumatic mitral (valve) prolapse: Secondary | ICD-10-CM | POA: Diagnosis present

## 2019-05-07 DIAGNOSIS — Z7952 Long term (current) use of systemic steroids: Secondary | ICD-10-CM | POA: Diagnosis not present

## 2019-05-07 DIAGNOSIS — J849 Interstitial pulmonary disease, unspecified: Secondary | ICD-10-CM | POA: Diagnosis not present

## 2019-05-07 DIAGNOSIS — R0602 Shortness of breath: Secondary | ICD-10-CM | POA: Diagnosis not present

## 2019-05-07 DIAGNOSIS — I10 Essential (primary) hypertension: Secondary | ICD-10-CM | POA: Diagnosis present

## 2019-05-07 DIAGNOSIS — Z7951 Long term (current) use of inhaled steroids: Secondary | ICD-10-CM | POA: Diagnosis not present

## 2019-05-07 DIAGNOSIS — J984 Other disorders of lung: Secondary | ICD-10-CM | POA: Diagnosis present

## 2019-05-07 DIAGNOSIS — Z79899 Other long term (current) drug therapy: Secondary | ICD-10-CM | POA: Diagnosis not present

## 2019-05-07 DIAGNOSIS — J45901 Unspecified asthma with (acute) exacerbation: Secondary | ICD-10-CM | POA: Diagnosis present

## 2019-05-07 DIAGNOSIS — Z880 Allergy status to penicillin: Secondary | ICD-10-CM | POA: Diagnosis not present

## 2019-05-07 DIAGNOSIS — Z20828 Contact with and (suspected) exposure to other viral communicable diseases: Secondary | ICD-10-CM | POA: Diagnosis present

## 2019-05-07 DIAGNOSIS — Z888 Allergy status to other drugs, medicaments and biological substances status: Secondary | ICD-10-CM | POA: Diagnosis not present

## 2019-05-07 DIAGNOSIS — K222 Esophageal obstruction: Secondary | ICD-10-CM | POA: Diagnosis present

## 2019-05-07 DIAGNOSIS — M810 Age-related osteoporosis without current pathological fracture: Secondary | ICD-10-CM | POA: Diagnosis present

## 2019-05-07 DIAGNOSIS — J84112 Idiopathic pulmonary fibrosis: Secondary | ICD-10-CM | POA: Diagnosis present

## 2019-05-07 DIAGNOSIS — Z8261 Family history of arthritis: Secondary | ICD-10-CM | POA: Diagnosis not present

## 2019-05-07 DIAGNOSIS — R05 Cough: Secondary | ICD-10-CM | POA: Diagnosis not present

## 2019-05-07 DIAGNOSIS — Z9981 Dependence on supplemental oxygen: Secondary | ICD-10-CM | POA: Diagnosis not present

## 2019-05-07 DIAGNOSIS — Z825 Family history of asthma and other chronic lower respiratory diseases: Secondary | ICD-10-CM | POA: Diagnosis not present

## 2019-05-07 DIAGNOSIS — J449 Chronic obstructive pulmonary disease, unspecified: Secondary | ICD-10-CM | POA: Diagnosis present

## 2019-05-07 DIAGNOSIS — Z9049 Acquired absence of other specified parts of digestive tract: Secondary | ICD-10-CM | POA: Diagnosis not present

## 2019-05-07 DIAGNOSIS — K219 Gastro-esophageal reflux disease without esophagitis: Secondary | ICD-10-CM | POA: Diagnosis present

## 2019-05-07 DIAGNOSIS — J9621 Acute and chronic respiratory failure with hypoxia: Secondary | ICD-10-CM | POA: Diagnosis present

## 2019-05-07 DIAGNOSIS — Z803 Family history of malignant neoplasm of breast: Secondary | ICD-10-CM | POA: Diagnosis not present

## 2019-05-07 LAB — SARS CORONAVIRUS 2 (TAT 6-24 HRS): SARS Coronavirus 2: NEGATIVE

## 2019-05-07 MED ORDER — GUAIFENESIN 100 MG/5ML PO SOLN
5.0000 mL | ORAL | Status: DC | PRN
  Administered 2019-05-08: 100 mg via ORAL
  Filled 2019-05-07: qty 10

## 2019-05-07 MED ORDER — IPRATROPIUM-ALBUTEROL 0.5-2.5 (3) MG/3ML IN SOLN
3.0000 mL | Freq: Three times a day (TID) | RESPIRATORY_TRACT | Status: DC
  Administered 2019-05-08 – 2019-05-10 (×7): 3 mL via RESPIRATORY_TRACT
  Filled 2019-05-07 (×7): qty 3

## 2019-05-07 MED ORDER — ALBUTEROL SULFATE (2.5 MG/3ML) 0.083% IN NEBU: 2.5000 mg | INHALATION_SOLUTION | Freq: Four times a day (QID) | RESPIRATORY_TRACT | Status: DC | PRN

## 2019-05-07 MED ORDER — METHYLPREDNISOLONE SODIUM SUCC 40 MG IJ SOLR
40.0000 mg | Freq: Two times a day (BID) | INTRAMUSCULAR | Status: AC
  Administered 2019-05-07 – 2019-05-08 (×2): 40 mg via INTRAVENOUS
  Filled 2019-05-07 (×2): qty 1

## 2019-05-07 MED ORDER — ALBUTEROL SULFATE (2.5 MG/3ML) 0.083% IN NEBU
2.5000 mg | INHALATION_SOLUTION | Freq: Four times a day (QID) | RESPIRATORY_TRACT | Status: DC
  Administered 2019-05-07 (×3): 2.5 mg via RESPIRATORY_TRACT
  Filled 2019-05-07 (×3): qty 3

## 2019-05-07 MED ORDER — BENZONATATE 100 MG PO CAPS
200.0000 mg | ORAL_CAPSULE | Freq: Three times a day (TID) | ORAL | Status: DC
  Administered 2019-05-07 – 2019-05-10 (×9): 200 mg via ORAL
  Filled 2019-05-07 (×9): qty 2

## 2019-05-07 MED ORDER — DEXTROMETHORPHAN POLISTIREX ER 30 MG/5ML PO SUER
15.0000 mg | Freq: Two times a day (BID) | ORAL | Status: DC | PRN
  Administered 2019-05-07 – 2019-05-08 (×2): 15 mg via ORAL
  Filled 2019-05-07 (×4): qty 5

## 2019-05-07 NOTE — Progress Notes (Signed)
PROGRESS NOTE   Stacey [name]  [id number]    DOB: Dec 08, 1940    DOA: 05/06/2019  PCP: Shon Baton, MD   I have briefly reviewed patients previous medical records in [facility].  Chief Complaint  Patient presents with  . 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  Admitted for possible ILD/asthma flare.  Slowly improving   Assessment & Plan:   Active Problems:   Rheumatoid arthritis ([place])   Asthma   Restrictive lung disease   ILD (interstitial lung disease) ([place])   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 esophagitis   Asthma exacerbation   Essential hypertension   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HCC)   ILD/asthma flare  SARS coronavirus 2 testing negative.  Chest x-ray: Coarse interstitial changes throughout the lungs with biapical pleuroparenchymal scarring compatible with UIP.  No superimposed acute cardiopulmonary abnormality.  No symptoms to suggest infectious flare.  S/p IV Solu-Medrol 125 mg x 1 dose in ED.  Feels better.  Improved but still has wheezing and dyspnea, breathing not yet at baseline.  Repeat IV Solu-Medrol 40 mg every 12 today and then consider oral prednisone at possible discharge in a.m.  Incentive spirometry, bronchodilator nebulizations.  Oxygen supplementation.  Acute on chronic hypoxic respiratory failure.  Precipitated by above problem.  Currently saturating in the high 90s on 2 L/min oxygen.  Wean oxygen as tolerated for saturations >92%.  Check home O2 requirement prior to  discharge.  Chronic cough  Likely multifactorial i.e GERD, asthma, ILD.  Seen OP ENT recently and no vocal cord or other ENT problems.  Continue PPI, Singulair.  Added Tessalon.  Rheumatoid arthritis  Followed at [facility] since diagnosis approximately 20 years ago.  No clinical flare.  She takes Rituxan every 6 months.  Essential hypertension  Controlled on losartan 25 mg daily.  Hyperlipidemia  Continue statins.   DVT prophylaxis: Lovenox Code Status: Full Family Communication: None at bedside Disposition: DC home pending clinical improvement, hopefully 10/26.  She was admitted as observation.  However given her advanced age, frail physical status, breathing not yet back to baseline with high risk for recurrent worsening, need for continued treatment as noted above, close monitoring, will remain hospitalized additional night.  Thereby changed to inpatient status.   Consultants:  None  Procedures:  None  Antimicrobials:  None   Subjective: Patient interviewed and examined along with RN in room.  Feels better.  Dyspnea improved, breathing almost but not yet back to baseline.  Ongoing unchanged chronic dry cough.  No chest pain.  Used to use oxygen as needed but lately has been using oxygen 2 L/min chronically at bedtime.  Objective:  Vitals:   05/07/19 0035 05/07/19 0647 05/07/19 0745 05/07/19 0800  BP: (!) 164/100 137/70    Pulse: 92 71    Resp: (!) 22 15    Temp: 98.2 F (36.8 C) 98 F (36.7 C)    TempSrc: Oral Oral    SpO2: 95% 99% 99% 99%  Weight: 63.5 kg     Height: 5\' 6"  (1.676 m)       Examination:  General exam: Pleasant elderly female, moderately built and thinly nourished lying comfortably propped up in bed with intermittent dry coughing spells. Respiratory system: Reduced breath sounds bilaterally.  Scattered few bilateral expiratory rhonchi.  Bibasal coarse Velcro-like chronic sounding crackles. Cardiovascular system: S1 & S2 heard, RRR. No  JVD, murmurs, rubs, gallops or clicks. No pedal edema. Gastrointestinal system: Abdomen is nondistended, soft and nontender. No organomegaly or masses felt. Normal bowel sounds heard. Central nervous system: Alert and oriented. No focal neurological deficits. Extremities: Symmetric 5 x 5 power. Skin: No rashes, lesions or ulcers Psychiatry: Judgement and insight appear normal. Mood & affect appropriate.     Data Reviewed: I have personally reviewed following labs and imaging studies  CBC: Recent Labs  Lab 05/06/19 1715  WBC 13.9*  NEUTROABS 12.0*  HGB 12.9  HCT 41.5  MCV 93.7  PLT Q000111Q   Basic Metabolic Panel: Recent Labs  Lab 05/06/19 1715  NA 139  K 4.2  CL 100  CO2 29  GLUCOSE 120*  BUN 11  CREATININE 0.64  CALCIUM 8.9   Liver Function Tests: No results for input(s): AST, ALT, ALKPHOS, BILITOT, PROT, ALBUMIN in the last 168 hours.  Cardiac Enzymes: No results for input(s): CKTOTAL, CKMB, CKMBINDEX, TROPONINI in the last 168 hours.  CBG: No results for input(s): GLUCAP in the last 168 hours.  Recent Results (from the past 240 hour(s))  SARS CORONAVIRUS 2 (TAT 6-24 HRS) Nasopharyngeal Nasopharyngeal Swab     Status: None   Collection Time: 05/06/19  5:15 PM   Specimen: Nasopharyngeal Swab  Result Value Ref Range Status   SARS Coronavirus 2 NEGATIVE NEGATIVE Final    Comment: (NOTE) SARS-CoV-2 target nucleic acids are NOT DETECTED. The SARS-CoV-2 RNA is generally detectable in upper and lower respiratory specimens during the acute phase of infection. Negative results do not preclude SARS-CoV-2 infection, do not rule out co-infections with other pathogens, and should not be used as the sole basis for treatment or other patient management decisions. Negative results must be combined with clinical observations, patient history, and epidemiological information. The expected result is Negative. Fact Sheet for Patients: SugarRoll.be  Fact Sheet for Healthcare Providers: https://www.woods-mathews.com/ This test is not yet approved or cleared by the [place] FDA and  has been authorized for detection and/or diagnosis of SARS-CoV-2 by FDA under an Emergency Use Authorization (EUA). This EUA will remain  in effect (meaning this test can be used) for the duration of the COVID-19 declaration under Section 56 4(b)(1) of the Act, 21 U.S.C. section 360bbb-3(b)(1), unless the authorization is terminated or revoked sooner. Performed at [facility], [place] [address] [place], [place] [address]          Radiology Studies: Dg Chest 2 View  Result Date: 05/06/2019 CLINICAL DATA:  Shortness of breath and cough EXAM: CHEST - 2 VIEW COMPARISON:  CT 04/24/2019, radiograph 12/22/2018 FINDINGS: Coarse interstitial changes throughout the lungs with biapical pleuroparenchymal scarring compatible with a UIP pattern better assessed on high-resolution CT exam performed 04/24/2019. No superimposed consolidative process is seen. No pneumothorax or visible effusion. The aorta is calcified and tortuous. Remaining cardiomediastinal contours are unremarkable. Degenerative changes are present in the imaged spine and shoulders. Nonspecific coarse calcification in the left axilla, unchanged from comparison exams. Surgical clips in the right upper quadrant. IMPRESSION: Coarse interstitial changes throughout the lungs with biapical pleuroparenchymal scarring, compatible with a UIP pattern better assessed on high-resolution CT exam. No superimposed acute cardiopulmonary abnormality. Electronically Signed  By: Lovena Le M.D.   On: 05/06/2019 16:47        Scheduled Meds: . albuterol  2.5 mg Nebulization Q6H  . amitriptyline  25 mg Oral QHS  . enoxaparin (LOVENOX) injection  40 mg Subcutaneous QHS  . gabapentin  600 mg Oral QHS  . losartan  25 mg Oral Daily  . mometasone-formoterol  2 puff Inhalation BID  . montelukast  10  mg Oral QHS  . pantoprazole  40 mg Oral QPC breakfast  . pravastatin  20 mg Oral QPM  . predniSONE  10 mg Oral Daily  . sodium chloride flush  3 mL Intravenous Q12H  . venlafaxine XR  75 mg Oral QHS   Continuous Infusions: . sodium chloride       LOS: 0 days     Vernell Leep, MD, FACP, [facility]. [facility]  To contact the attending provider between 7A-7P or the covering provider during after hours 7P-7A, please log into the web site [email or web address] and access using universal [place] password for that web site. If you do not have the password, please call the hospital operator.  05/07/2019, 2:06 PM

## 2019-05-08 DIAGNOSIS — J849 Interstitial pulmonary disease, unspecified: Secondary | ICD-10-CM

## 2019-05-08 DIAGNOSIS — R05 Cough: Secondary | ICD-10-CM

## 2019-05-08 MED ORDER — METHYLPREDNISOLONE SODIUM SUCC 40 MG IJ SOLR
40.0000 mg | Freq: Two times a day (BID) | INTRAMUSCULAR | Status: AC
  Administered 2019-05-08 (×2): 40 mg via INTRAVENOUS
  Filled 2019-05-08 (×2): qty 1

## 2019-05-08 MED ORDER — HYDROCOD POLST-CPM POLST ER 10-8 MG/5ML PO SUER
5.0000 mL | Freq: Two times a day (BID) | ORAL | Status: DC | PRN
  Administered 2019-05-08 – 2019-05-09 (×4): 5 mL via ORAL
  Filled 2019-05-08 (×4): qty 5

## 2019-05-08 MED ORDER — PANTOPRAZOLE SODIUM 40 MG PO TBEC
40.0000 mg | DELAYED_RELEASE_TABLET | Freq: Two times a day (BID) | ORAL | Status: DC
  Administered 2019-05-08 – 2019-05-10 (×5): 40 mg via ORAL
  Filled 2019-05-08 (×5): qty 1

## 2019-05-08 NOTE — Progress Notes (Signed)
Patient is experiencing incessant coughing. Provided hot tea per her request. Oxy sat level remains 94-96% on 2L [place]. PRN dose of Dextromethorphan administered at 0558 by night nurse. Will notify Dr. Algis Liming.

## 2019-05-08 NOTE — Progress Notes (Signed)
PROGRESS NOTE   Stacey [name]  [id number]    DOB: 1941/06/24    DOA: 05/06/2019  PCP: Shon Baton, MD   I have briefly reviewed patients previous medical records in [facility].  Chief Complaint  Patient presents with   Shortness of Breath    Brief Narrative:  78 year old female with PMH of rheumatoid arthritis (followed for years at [facility]) with UIP/ILD (followed at [facility] pulmonology), asthma, HTN, chronic cough, seen at pulmonology office 04/02/2019 for asthma exacerbation treated with prednisone burst and taper, seen OP ENT for chronic cough which was unrevealing, HRCT chest 04/24/2019 showed UIP, recent consistent home oxygen use at bedtime, presented to ED with few days history of progressive dyspnea, wheezing and unchanged chronic cough.  Admitted for possible ILD/asthma flare.  Slowly improving then felt worse on 10/26, PCCM consulted.   Assessment & Plan:   Active Problems:   Rheumatoid arthritis ([place])   Asthma   Restrictive lung disease   ILD (interstitial lung disease) ([place])   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 esophagitis   Asthma exacerbation   Essential hypertension   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HCC)   ILD/asthma flare  SARS coronavirus 2 testing negative.  Chest x-ray: Coarse interstitial changes throughout the lungs with biapical pleuroparenchymal scarring compatible with UIP.  No superimposed acute cardiopulmonary abnormality.  No symptoms to suggest infectious flare.  Incentive spirometry, bronchodilator nebulizations.  Oxygen supplementation.   Treated with IV Solu-Medrol since admission.  Seem to be improving yesterday with today again complaining of some worsening dyspnea, mostly worsened cough.  Consulted PCCM.  PCCM recommends IV Solu-Medrol 40 mg every 12 hours x 2 doses (ordered) then can switch to oral prednisone 40 mg with gradual taper down by 10 mg every 3 days down to her baseline dose of 2 mg alternating with 4 mg (confirmed with Dr. Elsworth Soho).  Has  outpatient follow-up with Dr. Chase Caller 10/28 at 10:30 AM.  Acute on chronic hypoxic respiratory failure.  Precipitated by above problem.  Currently saturating in the high 90s on 2 L/min oxygen.  Wean oxygen as tolerated for saturations >92%.  Check home O2 requirement prior to discharge.  Chronic cough  Likely multifactorial i.e GERD, asthma, ILD.  Seen OP ENT recently and no vocal cord or other ENT problems.  Continue PPI, Singulair.  Added Tessalon.   Compared to yesterday, worsened cough today.  Patient and daughter on speaker phone requested pulmonary consultation.  PCCM input appreciated, worsening cough could be related to ILD versus GERD.  ILD treatment as above.  GI evaluation, could be done as outpatient.  Added Tussionex for temporary relief from extremely bothersome cough.  Patient and daughter made aware of side effects.  Rheumatoid arthritis  Followed at [facility] since diagnosis approximately 20 years ago.  No clinical flare.  She takes Rituxan every 6 months.  Essential hypertension  Controlled on losartan 25 mg daily.  Hyperlipidemia  Continue statins.   DVT prophylaxis: Lovenox Code Status: Full Family Communication: None at bedside Disposition: DC home pending clinical improvement, hopefully 10/27.   Consultants:  None  Procedures:  None  Antimicrobials:  None   Subjective: Complaining of some worsening dyspnea, mostly worsened cough.  No chest pain.  Objective:  Vitals:   05/07/19 2058 05/08/19 0539 05/08/19 0817 05/08/19 0851  BP:  (!) 151/70  127/74  Pulse:  62  79  Resp:  18  20  Temp:  98.2 F (36.8 C)  97.7 F (36.5 C)  TempSrc:  Oral    SpO2:  96% 99% 94% 97%  Weight:      Height:        Examination:  General exam: Pleasant elderly female, moderately built and thinly nourished sitting up in bed with intermittent dry coughing spells. Respiratory system: Harsh and reduced breath sounds bilaterally.  Few expiratory rhonchi,  better than yesterday.  Bibasal coarse Velcro-like chronic sounding crackles.  No increased work of breathing. Cardiovascular system: S1 and S2 heard, RRR.  No JVD, murmurs or pedal edema.  Gastrointestinal system: Abdomen is nondistended, soft and nontender. No organomegaly or masses felt. Normal bowel sounds heard. Central nervous system: Alert and oriented. No focal neurological deficits. Extremities: Symmetric 5 x 5 power. Skin: No rashes, lesions or ulcers Psychiatry: Judgement and insight appear normal. Mood & affect appropriate.     Data Reviewed: I have personally reviewed following labs and imaging studies  CBC: Recent Labs  Lab 05/06/19 1715  WBC 13.9*  NEUTROABS 12.0*  HGB 12.9  HCT 41.5  MCV 93.7  PLT Q000111Q   Basic Metabolic Panel: Recent Labs  Lab 05/06/19 1715  NA 139  K 4.2  CL 100  CO2 29  GLUCOSE 120*  BUN 11  CREATININE 0.64  CALCIUM 8.9   Liver Function Tests: No results for input(s): AST, ALT, ALKPHOS, BILITOT, PROT, ALBUMIN in the last 168 hours.  Cardiac Enzymes: No results for input(s): CKTOTAL, CKMB, CKMBINDEX, TROPONINI in the last 168 hours.  CBG: No results for input(s): GLUCAP in the last 168 hours.  Recent Results (from the past 240 hour(s))  SARS CORONAVIRUS 2 (TAT 6-24 HRS) Nasopharyngeal Nasopharyngeal Swab     Status: None   Collection Time: 05/06/19  5:15 PM   Specimen: Nasopharyngeal Swab  Result Value Ref Range Status   SARS Coronavirus 2 NEGATIVE NEGATIVE Final    Comment: (NOTE) SARS-CoV-2 target nucleic acids are NOT DETECTED. The SARS-CoV-2 RNA is generally detectable in upper and lower respiratory specimens during the acute phase of infection. Negative results do not preclude SARS-CoV-2 infection, do not rule out co-infections with other pathogens, and should not be used as the sole basis for treatment or other patient management decisions. Negative results must be combined with clinical observations, patient history,  and epidemiological information. The expected result is Negative. Fact Sheet for Patients: SugarRoll.be Fact Sheet for Healthcare Providers: https://www.woods-mathews.com/ This test is not yet approved or cleared by the [place] FDA and  has been authorized for detection and/or diagnosis of SARS-CoV-2 by FDA under an Emergency Use Authorization (EUA). This EUA will remain  in effect (meaning this test can be used) for the duration of the COVID-19 declaration under Section 56 4(b)(1) of the Act, 21 U.S.C. section 360bbb-3(b)(1), unless the authorization is terminated or revoked sooner. Performed at [facility], [place] [address], [place], [place] [address]          Radiology Studies: Dg Chest 2 View  Result Date: 05/06/2019 CLINICAL DATA:  Shortness of breath and cough EXAM: CHEST - 2 VIEW COMPARISON:  CT 04/24/2019, radiograph 12/22/2018 FINDINGS: Coarse interstitial changes throughout the lungs with biapical pleuroparenchymal scarring compatible with a UIP pattern better assessed on high-resolution CT exam performed 04/24/2019. No superimposed consolidative process is seen. No pneumothorax or visible effusion. The aorta is calcified and tortuous. Remaining cardiomediastinal contours are unremarkable. Degenerative changes are present in the imaged spine and shoulders. Nonspecific coarse calcification in the left axilla, unchanged from comparison exams. Surgical clips in the right upper quadrant. IMPRESSION: Coarse interstitial changes throughout  the lungs with biapical pleuroparenchymal scarring, compatible with a UIP pattern better assessed on high-resolution CT exam. No superimposed acute cardiopulmonary abnormality. Electronically Signed   By: Lovena Le M.D.   On: 05/06/2019 16:47        Scheduled Meds:  amitriptyline  25 mg Oral QHS   benzonatate  200 mg Oral TID   enoxaparin (LOVENOX) injection  40 mg Subcutaneous QHS     gabapentin  600 mg Oral QHS   ipratropium-albuterol  3 mL Nebulization TID   losartan  25 mg Oral Daily   methylPREDNISolone (SOLU-MEDROL) injection  40 mg Intravenous Q12H   mometasone-formoterol  2 puff Inhalation BID   montelukast  10 mg Oral QHS   pantoprazole  40 mg Oral BID AC   pravastatin  20 mg Oral QPM   predniSONE  10 mg Oral Daily   sodium chloride flush  3 mL Intravenous Q12H   venlafaxine XR  75 mg Oral QHS   Continuous Infusions:  sodium chloride       LOS: 1 day     Vernell Leep, MD, FACP, [facility]. [facility]  To contact the attending provider between 7A-7P or the covering provider during after hours 7P-7A, please log into the web site [email or web address] and access using universal [place] password for that web site. If you do not have the password, please call the hospital operator.  05/08/2019, 3:54 PM

## 2019-05-08 NOTE — Progress Notes (Signed)
Patient's frequency of coughing has improved. Oxygen saturation level is ranging from 87%-92% on 2L [place]. Patient encouraged to do deep breathing intermittently. Oxygen saturation level range increased to 97%. Patient also encouraged to continue to drink fluids. Patient verbalized understanding. HOB elevated. Call bell within reach. Will continue to monitor. Plan is to do desaturation screen after patient rests. Reports generalized weakness and fatigue.

## 2019-05-08 NOTE — Progress Notes (Signed)
SATURATION QUALIFICATIONS: (This note is used to comply with regulatory documentation for home oxygen)  Patient Saturations on Room Air at Rest = 94 %  Patient Saturations on Room Air while Ambulating = 82%  Patient Saturations on 3.5 Liters of oxygen while Ambulating = 91%  Please briefly explain why patient needs home oxygen:   Patient became increasingly short of breath and tachycardiac while ambulating without oxygen. Pulse ranged from 117-137. Respirations are irregular and labored. Frequency of coughing increased during ambulation.

## 2019-05-08 NOTE — Progress Notes (Signed)
Patient's frequency of coughing has increased. Scheduled Benzonatate 200 mg and PRN Guaifenesin 100 mg administered. Patient encouraged to consume fluids. Oxygen saturation level ranges from 94%-100% on 2L [place]. Patient states she feels a little more rested and ready to complete desaturation screen.

## 2019-05-08 NOTE — Consult Note (Signed)
NAME:  Stacey [name], MRN:  [id number], DOB:  Oct 27, 1940, LOS: 1 ADMISSION DATE:  05/06/2019, CONSULTATION DATE:  05/08/19 REFERRING MD:  Dr. 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   History of present illness   78 y/o F, never smoker,  who presented to [facility] on 10/24 with reports of increasing shortness of breath.  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 Silverio Decamp), ECHO 01/2018 with elevated PASP and decline in spirometry / DLCO in 2019. She was last seen by Dr. Chase Caller on 9/14 with plans for ENT evaluation to rule out RA involvement of vocal cords, HRCT and prednisone taper.  She saw ENT on 10/5 (Dr. Redmond Baseman) with essentially normal larynx, nasopharynx on exam / no clear source of cough on exam.    She was admitted 10/24 with increasing shortness of breath. She reports during admit, her cough has evolved and become more persistent.  States the cough is dry, non-productive.  Work up notable for troponin negative.  EKG SR without ST changes. Chest XRAY evaluation negative for acute process.  She was treated with tessalon TID, PRN tussionex, PRN delsym for cough.  Steroids were increased to 40 mg IV BID on admit.  Home bronchodilator regimen continued.  Pt denies fevers, chills, n/v/d.  Given ongoing cough despite therapy, PCCM consulted for evaluation.    Past Medical History  Rheumatoid Arthritis - followed by Dr. 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 Afebrile.   Objective   Blood pressure 127/74, pulse 79, temperature 97.7 F (36.5 C), resp. rate 20, height 5\' 6"  (1.676 m), weight 63.5 kg, SpO2 97 %.        Intake/Output Summary (Last 24 hours) at 05/08/2019 1036 Last data filed at 05/08/2019 0849 Gross per 24 hour  Intake 1541 ml  Output 2575 ml  Net -1034 ml   Filed Weights   05/06/19 1601 05/07/19 0035  Weight: 63 kg 63.5 kg    Examination: General: pleasant, elderly female sitting up in bed in NAD  HEENT: MM pink/moist, no jvd, "froggy" voice quality per patient Neuro: AAOx4, speech clear, MAE   CV: s1s2 rrr, no m/r/g PULM:  Even/non-labored on room air, lungs bilaterally with posterior dry crackles 2/3 way up, dry hacking cough during exam.  GI: soft, bsx4 active, tolerating PO's Extremities: warm/dry, no edema  Skin: no rashes or lesions  Resolved Hospital Problem list     Assessment & Plan:   Cough  Dyspnea  UIP  RA ILD  Chronic Hypoxemic Respiratory Failure - 2L O2 QHS GERD / Esophageal Stricture Complex pulmonary history with progressive SOB, cough this admit.  Negative CXR, negative COVID testing, no viral prodrome. No evidence to suggest volume overload. She has not been out of the home / sick exposures. She has hx of GERD with esophageal  stricture requiring dilatation in 06/2018. HRCT reviewed with some progression of ILD / UIP since 2019.  Query contribution of acid reflux to current symptoms.    Plan: Solumedrol 40 mg Q12 x2 doses then prednisone 10 mg QD Continue BID PPI Have asked [facility] to re-evaluate her for possible esophageal stricture / GERD (inpatient vs outpatient) Continue home bronchodilators, singulair Continue nocturnal O2  Ambulate / mobilize  Keep outpatient pulmonary follow up with Dr. Chase Caller (10/28 at 10:30)  Best practice:  Diet: Per primary  Pain/Anxiety/Delirium protocol (if indicated): n/a VAP protocol (if indicated): n/a  DVT prophylaxis: per primary  GI prophylaxis: BID PPI Glucose control: per primary  Mobility: As tolerated  Code Status: Full Code  Family Communication: Patient updated on plan of care 10/26.   Disposition: Per Primary   Labs   CBC: Recent Labs  Lab 05/06/19 1715  WBC 13.9*  NEUTROABS 12.0*  HGB 12.9  HCT 41.5  MCV 93.7  PLT [id number]    Basic Metabolic Panel: Recent Labs  Lab 05/06/19 1715  NA 139  K 4.2  CL 100  CO2 29  GLUCOSE 120*  BUN 11  CREATININE 0.64  CALCIUM 8.9   GFR: Estimated Creatinine Clearance: 53.4 mL/min (by C-G formula based on SCr of 0.64 mg/dL). Recent Labs  Lab 05/06/19 1715  WBC 13.9*    Liver Function Tests: No results for input(s): AST, ALT, ALKPHOS, BILITOT, PROT, ALBUMIN in the last 168 hours. No results for input(s): LIPASE, AMYLASE in the last 168 hours. No results for input(s): AMMONIA in the last 168 hours.  ABG    Component Value Date/Time   PHART 7.420 12/18/2018 0010   PCO2ART 46.1 12/18/2018 0010   PO2ART 78.0 (L) 12/18/2018 0010   HCO3 29.3 (H) 12/18/2018 0010   TCO2 31 12/18/2009 0347   O2SAT 95.4 12/18/2018 0010     Coagulation Profile: No results for input(s): INR, PROTIME in the last 168 hours.  Cardiac Enzymes: No results for input(s): CKTOTAL, CKMB, CKMBINDEX, TROPONINI in the last 168  hours.  HbA1C: Hgb A1c MFr Bld  Date/Time Value Ref Range Status  08/20/2015 11:10 AM 5.9 (H) 4.8 - 5.6 % Final    Comment:             Pre-diabetes: 5.7 - 6.4          Diabetes: >6.4          Glycemic control for adults with diabetes: <7.0     CBG: No results for input(s): GLUCAP in the last 168 hours.  Review of Systems: Positives in [place]  Gen: Denies fever, chills, weight change, fatigue, night sweats HEENT: Denies blurred vision, double vision, hearing loss, tinnitus, sinus congestion, rhinorrhea, sore throat, neck stiffness, dysphagia PULM: Denies shortness of breath, cough, sputum production, hemoptysis, wheezing CV: Denies chest pain, edema, orthopnea, paroxysmal nocturnal dyspnea, palpitations GI: Denies abdominal pain, nausea, vomiting, diarrhea, hematochezia, melena, constipation, change in bowel habits GU:  Denies dysuria, hematuria, polyuria, oliguria, urethral discharge Endocrine: Denies hot or cold intolerance, polyuria, polyphagia or appetite change Derm: Denies rash, dry skin, scaling or peeling skin change Heme: Denies easy bruising, bleeding, bleeding gums Neuro: Denies headache, numbness, weakness, slurred speech, loss of memory or consciousness   Past Medical History  She,  has a past medical history of Abnormal finding of blood chemistry, Asthma, H/O measles, H/O varicella, Hypertension, 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[place]), 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   Surgical History    Past Surgical History:  Procedure Laterality Date  . CHOLECYSTECTOMY  2011  . WISDOM TOOTH EXTRACTION       Social History   reports that she has never smoked. She has never used smokeless tobacco. She reports that she does not drink alcohol or use drugs.   Family History   Her family history includes Anemia  in her mother; Asthma in her mother; Breast cancer (age of onset: 87) in her maternal grandmother; COPD in her father; Polymyalgia rheumatica in her mother; Pulmonary fibrosis in her father.   Allergies Allergies  Allergen Reactions  . Penicillins Other (See Comments)    Reaction unknown occurred during childhood Has patient had a PCN reaction causing immediate rash, facial/tongue/throat swelling, SOB or lightheadedness with hypotension: Unknown Has patient had a PCN reaction causing severe rash involving mucus membranes or skin necrosis: Unknown Has patient had a PCN reaction that required hospitalization: No Has patient had a PCN reaction occurring within the last 10 years: No If all of the above answers are "NO", then may proceed with Cephalosporin use.  Unsur  . Remicade [Infliximab] Other (See Comments)    Reaction unknown  . Sulfa Antibiotics Other (See Comments)    Reaction unknown  . Sulfamethoxazole Other (See Comments)    Unsure of reaction As a child not sure      Home Medications  Prior to Admission medications   Medication Sig Start Date End Date Taking? Authorizing Provider  albuterol (VENTOLIN HFA) 108 (90 Base) MCG/ACT inhaler Inhale 2 puffs into the lungs every 6 (six) hours as needed for wheezing or shortness of breath. 01/02/19  Yes Fenton Foy, NP  amitriptyline (ELAVIL) 25 MG tablet Take 25 mg by mouth at bedtime. 02/04/15  Yes [provider]  beta carotene w/minerals (OCUVITE) tablet Take 1 tablet by mouth daily.   Yes [provider]  cholecalciferol (VITAMIN D) 1000 UNITS tablet Take 1,000 Units by mouth daily.   Yes [provider]  gabapentin (NEURONTIN) 300 MG capsule Take 600 mg by mouth at bedtime.   Yes [provider]  losartan (COZAAR) 25 MG tablet Take 25 mg by mouth daily. 02/04/15  Yes [provider]  methylPREDNISolone (MEDROL) 4 MG tablet Take 2-4 mg by mouth See admin instructions. Pt takes a 4 mg  on one day and the alternates to 2 mg the next day   Yes [provider]  mometasone-formoterol (DULERA) 100-5 MCG/ACT AERO Inhale 2 puffs into the lungs 2 (two) times daily. 01/02/19  Yes Fenton Foy, NP  montelukast (SINGULAIR) 10 MG tablet TAKE (1) TABLET DAILY AT BEDTIME. Patient taking differently: Take 10 mg by mouth at bedtime.  02/17/19  Yes Fenton Foy, NP  pantoprazole (PROTONIX) 40 MG tablet Take 1 tablet (40 mg total) by mouth 2 (two) times daily before a meal. Patient taking differently: Take 40  mg by mouth daily.  07/18/18  Yes Mauri Pole, MD  pravastatin (PRAVACHOL) 20 MG tablet Take 1 tablet (20 mg total) by mouth every evening. 01/06/19 01/01/20 Yes Buford Dresser, MD  venlafaxine XR (EFFEXOR-XR) 75 MG 24 hr capsule Take 75 mg by mouth at bedtime.    Yes [provider]  benzonatate (TESSALON) 200 MG capsule TAKE 1 CAPSULE BY MOUTH THREE TIMES DAILY AS NEEDED FOR COUGH. Patient not taking: Reported on 05/06/2019 02/22/19   Lauraine Rinne, NP  dextromethorphan (DELSYM) 30 MG/5ML liquid Take 2.5 mLs (15 mg total) by mouth at bedtime. Patient not taking: Reported on 05/06/2019 01/27/18   Lavina Hamman, MD  doxycycline (VIBRA-TABS) 100 MG tablet Take 1 tablet (100 mg total) by mouth 2 (two) times daily. Patient not taking: Reported on 05/06/2019 03/08/19   Martyn Ehrich, NP  ipratropium-albuterol (DUONEB) 0.5-2.5 (3) MG/3ML SOLN Take 3 mLs by nebulization 3 (three) times daily. Patient not taking: Reported on 05/06/2019 12/24/18   Swayze, Ava, DO  methylPREDNISolone (MEDROL) 8 MG tablet Take 4 tabs x 2 days; then 2 tablets x 2 days; then 1 tab x 2 days Patient not taking: Reported on 05/06/2019 03/08/19   Martyn Ehrich, NP  polyethylene glycol [facility] / Floria Raveling) packet Take 17 g by mouth daily. Patient not taking: Reported on 05/06/2019 01/27/18   Lavina Hamman, MD  predniSONE (DELTASONE) 10 MG tablet Take 4 tablets x 1 day, 3 tablets x  1 day, then 2 tablets x 1 day, then 1 tablet x 1 day, then 5 mg x 1 day. Patient not taking: Reported on 05/06/2019 03/27/19   Brand Males, MD  riTUXimab (RITUXAN) 100 MG/10ML injection Inject into the vein. Receives at [facility] but unsure of dose - no more doses for next 6 months as of 09/14/18    [provider]     Critical care time: [place], NP-C [facility] 05/08/2019, 10:36 AM

## 2019-05-09 ENCOUNTER — Telehealth: Payer: Self-pay

## 2019-05-09 DIAGNOSIS — J45901 Unspecified asthma with (acute) exacerbation: Secondary | ICD-10-CM

## 2019-05-09 DIAGNOSIS — J984 Other disorders of lung: Secondary | ICD-10-CM

## 2019-05-09 DIAGNOSIS — M069 Rheumatoid arthritis, unspecified: Secondary | ICD-10-CM

## 2019-05-09 DIAGNOSIS — I10 Essential (primary) hypertension: Secondary | ICD-10-CM

## 2019-05-09 MED ORDER — PANTOPRAZOLE SODIUM 40 MG PO TBEC: 40.0000 mg | DELAYED_RELEASE_TABLET | Freq: Two times a day (BID) | ORAL | 3 refills | Status: DC

## 2019-05-09 MED ORDER — PREDNISONE 20 MG PO TABS: 30.0000 mg | ORAL_TABLET | Freq: Every day | ORAL | Status: DC

## 2019-05-09 MED ORDER — PREDNISONE 20 MG PO TABS
40.0000 mg | ORAL_TABLET | Freq: Every day | ORAL | Status: DC
  Administered 2019-05-10: 40 mg via ORAL
  Filled 2019-05-09: qty 2

## 2019-05-09 MED ORDER — BENZONATATE 200 MG PO CAPS: 200.0000 mg | ORAL_CAPSULE | Freq: Three times a day (TID) | ORAL | 2 refills | Status: DC | PRN

## 2019-05-09 MED ORDER — HYDROCOD POLST-CPM POLST ER 10-8 MG/5ML PO SUER: 5.0000 mL | Freq: Two times a day (BID) | ORAL | 0 refills | Status: DC | PRN

## 2019-05-09 MED ORDER — PREDNISONE 20 MG PO TABS: 20.0000 mg | ORAL_TABLET | Freq: Every day | ORAL | Status: DC

## 2019-05-09 MED ORDER — PREDNISONE 10 MG PO TABS: 10.0000 mg | ORAL_TABLET | Freq: Every day | ORAL | Status: DC

## 2019-05-09 NOTE — Progress Notes (Signed)
PROGRESS NOTE    Stacey [name]  [id number] DOB: 1941-07-04 DOA: 05/06/2019 PCP: Shon Baton, MD   Brief Narrative:  78 year old with a history of rheumatoid arthritis followed at [facility], UIP/ILD, asthma, chronic cough, HTN presented with progressive shortness of breath and wheezing diagnosed with ILD/asthma flare.   Assessment & Plan:   Active Problems:   Rheumatoid arthritis ([place])   Asthma   Restrictive lung disease   ILD (interstitial lung disease) ([place])   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 esophagitis   Asthma exacerbation   Essential hypertension   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[place])  Acute on chronic hypoxic respiratory distress, 2 L nasal cannula at home as needed only Interstitial lung disease/asthma flare -Covid 19-negative -I-S, flutter valve.  Bronchodilator. -Pulmonary service consulted.  Tapering dose of prednisone has been ordered.  Chronic cough -Previously seen by ENT-no acute issues from their standpoint -Continue PPI, Singulair -Tessalon Perles.  Tussionex.  Rheumatoid arthritis -Follow-up patient at [facility].  Rituxan.  Essential hypertension -Continue losartan 25 mg daily  Hyperlipidemia -Statin   DVT prophylaxis: Lovenox Code Status: Full Family Communication: None at bedside Disposition Plan: We will maintain hospital stay for 24 hours to ensure she continues to improve on p.o. prednisone.  We will discharge her tomorrow morning.  Consultants:   Pulmonary  Procedures:   None  Antimicrobials:   None   Subjective: She feels well better than yesterday.  Does have a mild cough but significantly improved.  Able to ambulate a little bit more.  Does not yet feel 100% back to baseline.  Review of Systems Otherwise negative except as per HPI, including: General: Denies fever, chills, night sweats or unintended weight loss. Resp: Denies cough, wheezing Cardiac: Denies chest pain, palpitations, orthopnea, paroxysmal nocturnal dyspnea. GI: Denies abdominal pain,  nausea, vomiting, diarrhea or constipation GU: Denies dysuria, frequency, hesitancy or incontinence MS: Denies muscle aches, joint pain or swelling Neuro: Denies headache, neurologic deficits (focal weakness, numbness, tingling), abnormal gait Psych: Denies anxiety, depression, SI/HI/AVH Skin: Denies new rashes or lesions ID: Denies sick contacts, exotic exposures, travel  Objective: Vitals:   05/08/19 1957 05/08/19 2043 05/09/19 0557 05/09/19 0757  BP:  (!) 143/67 (!) 151/74   Pulse:  77 66   Resp:  16 16   Temp:  98.3 F (36.8 C) 98.3 F (36.8 C)   TempSrc:  Oral Oral   SpO2: 98% 98% 100% 97%  Weight:      Height:        Intake/Output Summary (Last 24 hours) at 05/09/2019 0854 Last data filed at 05/09/2019 0602 Gross per 24 hour  Intake 2130 ml  Output 3550 ml  Net -1420 ml   Filed Weights   05/06/19 1601 05/07/19 0035  Weight: 63 kg 63.5 kg    Examination:  General exam: Appears calm and comfortable  Respiratory system: Clear to auscultation. Respiratory effort normal. Cardiovascular system: S1 & S2 heard, RRR. No JVD, murmurs, rubs, gallops or clicks. No pedal edema. Gastrointestinal system: Abdomen is nondistended, soft and nontender. No organomegaly or masses felt. Normal bowel sounds heard. Central nervous system: Alert and oriented. No focal neurological deficits. Extremities: Symmetric 5 x 5 power. Skin: No rashes, lesions or ulcers Psychiatry: Judgement and insight appear normal. Mood & affect appropriate.     Data Reviewed:   CBC: Recent Labs  Lab 05/06/19 1715  WBC 13.9*  NEUTROABS 12.0*  HGB 12.9  HCT 41.5  MCV 93.7  PLT Q000111Q   Basic Metabolic Panel: Recent Labs  Lab 05/06/19 1715  NA 139  K 4.2  CL 100  CO2 29  GLUCOSE 120*  BUN 11  CREATININE 0.64  CALCIUM 8.9   GFR: Estimated Creatinine Clearance: 53.4 mL/min (by C-G formula based on SCr of 0.64 mg/dL). Liver Function Tests: No results for input(s): AST, ALT, ALKPHOS,  BILITOT, PROT, ALBUMIN in the last 168 hours. No results for input(s): LIPASE, AMYLASE in the last 168 hours. No results for input(s): AMMONIA in the last 168 hours. Coagulation Profile: No results for input(s): INR, PROTIME in the last 168 hours. Cardiac Enzymes: No results for input(s): CKTOTAL, CKMB, CKMBINDEX, TROPONINI in the last 168 hours. BNP (last 3 results) No results for input(s): PROBNP in the last 8760 hours. HbA1C: No results for input(s): HGBA1C in the last 72 hours. CBG: No results for input(s): GLUCAP in the last 168 hours. Lipid Profile: No results for input(s): CHOL, HDL, LDLCALC, TRIG, CHOLHDL, LDLDIRECT in the last 72 hours. Thyroid Function Tests: No results for input(s): TSH, T4TOTAL, FREET4, T3FREE, THYROIDAB in the last 72 hours. Anemia Panel: No results for input(s): VITAMINB12, FOLATE, FERRITIN, TIBC, IRON, RETICCTPCT in the last 72 hours. Sepsis Labs: No results for input(s): PROCALCITON, LATICACIDVEN in the last 168 hours.  Recent Results (from the past 240 hour(s))  SARS CORONAVIRUS 2 (TAT 6-24 HRS) Nasopharyngeal Nasopharyngeal Swab     Status: None   Collection Time: 05/06/19  5:15 PM   Specimen: Nasopharyngeal Swab  Result Value Ref Range Status   SARS Coronavirus 2 NEGATIVE NEGATIVE Final    Comment: (NOTE) SARS-CoV-2 target nucleic acids are NOT DETECTED. The SARS-CoV-2 RNA is generally detectable in upper and lower respiratory specimens during the acute phase of infection. Negative results do not preclude SARS-CoV-2 infection, do not rule out co-infections with other pathogens, and should not be used as the sole basis for treatment or other patient management decisions. Negative results must be combined with clinical observations, patient history, and epidemiological information. The expected result is Negative. Fact Sheet for Patients: SugarRoll.be Fact Sheet for Healthcare Providers:  https://www.woods-mathews.com/ This test is not yet approved or cleared by the [place] FDA and  has been authorized for detection and/or diagnosis of SARS-CoV-2 by FDA under an Emergency Use Authorization (EUA). This EUA will remain  in effect (meaning this test can be used) for the duration of the COVID-19 declaration under Section 56 4(b)(1) of the Act, 21 U.S.C. section 360bbb-3(b)(1), unless the authorization is terminated or revoked sooner. Performed at [facility], [place] [address] [place], [place] [address]          Radiology Studies: No results found.      Scheduled Meds: . amitriptyline  25 mg Oral QHS  . benzonatate  200 mg Oral TID  . enoxaparin (LOVENOX) injection  40 mg Subcutaneous QHS  . gabapentin  600 mg Oral QHS  . ipratropium-albuterol  3 mL Nebulization TID  . losartan  25 mg Oral Daily  . mometasone-formoterol  2 puff Inhalation BID  . montelukast  10 mg Oral QHS  . pantoprazole  40 mg Oral BID AC  . pravastatin  20 mg Oral QPM  . predniSONE  10 mg Oral Daily  . sodium chloride flush  3 mL Intravenous Q12H  . venlafaxine XR  75 mg Oral QHS   Continuous Infusions: . sodium chloride       LOS: 2 days   Time spent= 25 mins    Saga Balthazar Arsenio Loader, MD [facility]  If 7PM-7AM, please contact  night-coverage  05/09/2019, 8:54 AM

## 2019-05-09 NOTE — Telephone Encounter (Signed)
-----   Message from Levin Erp, [place] sent at 05/08/2019 12:15 PM EDT ----- Regarding: Can we get her appt in clinic this week with APP? Pt in hospital currently, but stable and being d/c tomorrow- she has cough and sob and pulmonary thinks may have some gerd contributing-nandigam patient- probably needs outpatient egd set up- but needs clinic visit first.   Thanks-JLL

## 2019-05-09 NOTE — Telephone Encounter (Signed)
Scheduled office visit with Alen Bleacher NP on 05/10/19 @ 2:00pm, but patient is still in the hospital. May have to reschedule

## 2019-05-09 NOTE — Progress Notes (Signed)
NAME:  Stacey [name], MRN:  [id number], DOB:  01-Jun-1941, LOS: 2 ADMISSION DATE:  05/06/2019, CONSULTATION DATE:  05/08/19 REFERRING MD:  Dr. 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   History of present illness   78 y/o F, never smoker,  who presented to [facility] on 10/24 with reports of increasing shortness of breath.  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 Silverio Decamp), ECHO 01/2018 with elevated PASP and decline in spirometry / DLCO in 2019. She was last seen by Dr. Chase Caller on 9/14 with plans for ENT evaluation to rule out RA involvement of vocal cords, HRCT and prednisone taper.  She saw ENT on 10/5 (Dr. Redmond Baseman) with essentially normal larynx, nasopharynx on exam / no clear source of cough on exam.    She was admitted 10/24 with increasing shortness of breath. She reports during admit, her cough has evolved and become more persistent.  States the cough is dry, non-productive.  Work up notable for troponin negative.  EKG SR without ST changes. Chest XRAY evaluation negative for acute process.  She was treated with tessalon TID, PRN tussionex, PRN delsym for cough.  Steroids were increased to 40 mg IV BID on admit.  Home bronchodilator regimen continued.  Pt denies fevers, chills, n/v/d.  Given ongoing cough despite therapy, PCCM consulted for evaluation.    Past Medical History  Rheumatoid Arthritis - followed by Dr. 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  Notes she walked off oxygen and saturations dropped into the low 80's.    Objective   Blood pressure (!) 151/74, pulse 66, temperature 98.3 F (36.8 C), temperature source Oral, resp. rate 16, height 5\' 6"  (1.676 m), weight 63.5 kg, SpO2 97 %.        Intake/Output Summary (Last 24 hours) at 05/09/2019 0921 Last data filed at 05/09/2019 0602 Gross per 24 hour  Intake 2130 ml  Output 3550 ml  Net -1420 ml   Filed Weights   05/06/19 1601 05/07/19 0035  Weight: 63 kg 63.5 kg    Examination: General: adult female lying in bed in NAD HEENT: MM pink/moist, no jvd, [place] O2, sclera anicteric Neuro: AAOx4, pleasant, speech clear, MAE, non-focal exam  CV: s1s2 rrr, no m/r/g PULM:  Even/non-labored  at rest, crackles R>L posterior 2/3 way up, 2L O2 with sats 100% GI: soft, bsx4 active  Extremities: warm/dry, no edema  Skin: no rashes or lesions  Resolved Hospital Problem list     Assessment & Plan:   Cough  Dyspnea  UIP  RA ILD  Chronic Hypoxemic Respiratory Failure - 2L O2 QHS GERD / Esophageal Stricture Complex pulmonary history with progressive SOB, cough this admit.  Negative CXR, negative COVID testing, no viral prodrome. No evidence to suggest volume overload. She has not been out of  the home / sick exposures. She has hx of GERD with esophageal stricture requiring dilatation in 06/2018. HRCT reviewed with some progression of ILD / UIP since 2019.  Query contribution of acid reflux to current symptoms.    Plan: Continue steroid taper to baseline dosing as outlined by Dr. Elsworth Soho 10/27 BID PPI (continue at discharge) Continue O2, ok to use with exertion 2L and nocturnal  Continue home bronchodilators, singulair  [facility] will arrange for follow up as outpatient next week  [facility] Follow up with Dr. Chase Caller 10/28 as planned  She will need tussionex + tessalon at discharge (ordered).  Best practice:  Diet: Per primary  Pain/Anxiety/Delirium protocol (if indicated): n/a VAP protocol (if indicated): n/a  DVT prophylaxis: per primary  GI prophylaxis: BID PPI Glucose control: per primary  Mobility: As tolerated  Code Status: Full Code  Family Communication: Patient updated on plan of care 10/27   Disposition: Per Primary   Labs   CBC: Recent Labs  Lab 05/06/19 1715  WBC 13.9*  NEUTROABS 12.0*  HGB 12.9  HCT 41.5  MCV 93.7  PLT [id number]    Basic Metabolic Panel: Recent Labs  Lab 05/06/19 1715  NA 139  K 4.2  CL 100  CO2 29  GLUCOSE 120*  BUN 11  CREATININE 0.64  CALCIUM 8.9   GFR: Estimated Creatinine Clearance: 53.4 mL/min (by C-G formula based on SCr of 0.64 mg/dL). Recent Labs  Lab 05/06/19 1715  WBC 13.9*    Liver Function Tests: No results for input(s): AST, ALT, ALKPHOS, BILITOT, PROT, ALBUMIN in the last 168 hours. No results for input(s): LIPASE, AMYLASE in the last 168 hours. No results for input(s): AMMONIA in the last 168 hours.  ABG    Component Value Date/Time   PHART 7.420 12/18/2018 0010   PCO2ART 46.1 12/18/2018 0010   PO2ART 78.0 (L) 12/18/2018 0010   HCO3 29.3 (H) 12/18/2018 0010   TCO2 31 12/18/2009 0347   O2SAT 95.4 12/18/2018 0010     Coagulation Profile: No results for input(s): INR, PROTIME in the last 168  hours.  Cardiac Enzymes: No results for input(s): CKTOTAL, CKMB, CKMBINDEX, TROPONINI in the last 168 hours.  HbA1C: Hgb A1c MFr Bld  Date/Time Value Ref Range Status  08/20/2015 11:10 AM 5.9 (H) 4.8 - 5.6 % Final    Comment:             Pre-diabetes: 5.7 - 6.4          Diabetes: >6.4          Glycemic control for adults with diabetes: <7.0     CBG: No results for input(s): GLUCAP in the last 168 hours.   Critical care time: n/a    Noe Gens, NP-C [facility] 05/09/2019, 9:21 AM

## 2019-05-09 NOTE — Progress Notes (Signed)
SATURATION QUALIFICATIONS: (This note is used to comply with regulatory documentation for home oxygen)  Patient Saturations on Room Air at Rest = 85%  Patient Saturations on Room Air while Ambulating = 80%  Patient Saturations on 3 Liters of oxygen while Ambulating = 92%  Please briefly explain why patient needs home oxygen: Patient does qualify for oxygen at this time due to oxygen level desaturation.

## 2019-05-10 ENCOUNTER — Ambulatory Visit: Payer: Medicare Other | Admitting: Nurse Practitioner

## 2019-05-10 ENCOUNTER — Ambulatory Visit: Payer: Medicare Other | Admitting: Internal Medicine

## 2019-05-10 ENCOUNTER — Telehealth: Payer: Self-pay

## 2019-05-10 DIAGNOSIS — K219 Gastro-esophageal reflux disease without esophagitis: Secondary | ICD-10-CM

## 2019-05-10 MED ORDER — PREDNISONE 10 MG PO TABS: ORAL_TABLET | ORAL | 0 refills | Status: AC

## 2019-05-10 MED ORDER — IPRATROPIUM-ALBUTEROL 0.5-2.5 (3) MG/3ML IN SOLN: 3.0000 mL | Freq: Three times a day (TID) | RESPIRATORY_TRACT | 0 refills | Status: DC

## 2019-05-10 NOTE — Telephone Encounter (Signed)
Cancelled appt. With Tye Savoy NP for today, patient is still in the hospital

## 2019-05-10 NOTE — Discharge Summary (Signed)
Physician Discharge Summary  Stacey [name] [id number] DOB: 1940-11-18 DOA: 05/06/2019  PCP: Shon Baton, MD  Admit date: 05/06/2019 Discharge date: 05/10/2019  Admitted From: Home Disposition: Home  Recommendations for Outpatient Follow-up:  1. Follow up with PCP in 1-2 weeks 2. Please obtain BMP/CBC in one week your next doctors visit.  3. Advised to use her home oxygen continuously at least for next 2 weeks and then maybe switch over to as needed 4. Follow-up patient pulmonary, appointment date and the discharge AVS 5. Taper course of prednisone prescribed  Home Health: None Equipment/Devices: Continuous oxygen Discharge Condition: Stable CODE STATUS: Full code Diet recommendation: 2 g salt  Brief/Interim Summary: 78 year old with a history of rheumatoid arthritis followed at [facility], UIP/ILD, asthma, chronic cough, HTN presented with progressive shortness of breath and wheezing diagnosed with ILD/asthma flare.  During the hospitalization pulmonary team was consulted and patient started on steroids and aggressive bronchodilators.  Over the course of 3 days her symptoms improved.  For her cough Tussionex was added which helped her. At this time she is stable to be discharged with outpatient follow-up recommendations as stated above.   Discharge Diagnoses:  Active Problems:   Rheumatoid arthritis ([place])   Asthma   Restrictive lung disease   ILD (interstitial lung disease) ([place])   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 esophagitis   Asthma exacerbation   Essential hypertension   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[place])  Acute on chronic hypoxic respiratory distress, 2 L nasal cannula at home as needed only Interstitial lung disease/asthma flare -Covid 19-negative -Currently feeling back to herself.  Will be prescribed after taper course of prednisone with outpatient follow-up with pulmonary as listed.  Advised to continue using bronchodilators -Advised to continue using her home oxygen but for now use it  continuously at 2 L.  Chronic cough -Previously seen by ENT-no acute issues from their standpoint -Continue PPI, Singulair -Tessalon Perles.  Tussionex.  Rheumatoid arthritis -Follow-up patient at [facility].  Rituxan.  Essential hypertension -Continue losartan 25 mg daily  Hyperlipidemia -Statin   Consultations:  Pulmonary  Subjective: No complaints feels much better today.  Discharge Exam: Vitals:   05/10/19 0730 05/10/19 0800  BP:    Pulse: 72   Resp: 16   Temp:    SpO2: 100% 98%   Vitals:   05/09/19 2055 05/10/19 0614 05/10/19 0730 05/10/19 0800  BP: 140/71 140/70    Pulse: 74 68 72   Resp: 18 18 16    Temp:      TempSrc:      SpO2: 94% 100% 100% 98%  Weight:      Height:        General: Pt is alert, awake, not in acute distress, 2 L nasal cannula Cardiovascular: RRR, S1/S2 +, no rubs, no gallops Respiratory: CTA bilaterally, no wheezing, no rhonchi Abdominal: Soft, NT, ND, bowel sounds + Extremities: no edema, no cyanosis  Discharge Instructions   Allergies as of 05/10/2019      Reactions   Penicillins Other (See Comments)   Reaction unknown occurred during childhood Has patient had a PCN reaction causing immediate rash, facial/tongue/throat swelling, SOB or lightheadedness with hypotension: Unknown Has patient had a PCN reaction causing severe rash involving mucus membranes or skin necrosis: Unknown Has patient had a PCN reaction that required hospitalization: No Has patient had a PCN reaction occurring within the last 10 years: No If all of the above answers are "NO", then may proceed with Cephalosporin use. Unsur   Remicade [infliximab] Other (See Comments)  Reaction unknown   Sulfa Antibiotics Other (See Comments)   Reaction unknown   Sulfamethoxazole Other (See Comments)   Unsure of reaction As a child not sure      Medication List    STOP taking these medications   doxycycline 100 MG tablet Commonly known as: VIBRA-TABS    methylPREDNISolone 4 MG tablet Commonly known as: MEDROL   methylPREDNISolone 8 MG tablet Commonly known as: Medrol   polyethylene glycol 17 g packet Commonly known as: MIRALAX / GLYCOLAX     TAKE these medications   albuterol 108 (90 Base) MCG/ACT inhaler Commonly known as: VENTOLIN HFA Inhale 2 puffs into the lungs every 6 (six) hours as needed for wheezing or shortness of breath.   amitriptyline 25 MG tablet Commonly known as: ELAVIL Take 25 mg by mouth at bedtime. Notes to patient: 05/10/2019   benzonatate 200 MG capsule Commonly known as: TESSALON Take 1 capsule (200 mg total) by mouth 3 (three) times daily as needed for cough. What changed: See the new instructions.   beta carotene w/minerals tablet Take 1 tablet by mouth daily. Notes to patient: 05/10/2019   chlorpheniramine-HYDROcodone 10-8 MG/5ML Suer Commonly known as: TUSSIONEX Take 5 mLs by mouth every 12 (twelve) hours as needed for cough (Not releaved by Delsym  or Robitussin.).   cholecalciferol 1000 units tablet Commonly known as: VITAMIN D Take 1,000 Units by mouth daily. Notes to patient: 05/10/2019   dextromethorphan 30 MG/5ML liquid Commonly known as: DELSYM Take 2.5 mLs (15 mg total) by mouth at bedtime.   gabapentin 300 MG capsule Commonly known as: NEURONTIN Take 600 mg by mouth at bedtime.   ipratropium-albuterol 0.5-2.5 (3) MG/3ML Soln Commonly known as: DUONEB Take 3 mLs by nebulization 3 (three) times daily.   losartan 25 MG tablet Commonly known as: COZAAR Take 25 mg by mouth daily. Notes to patient: 05/11/2019   mometasone-formoterol 100-5 MCG/ACT Aero Commonly known as: DULERA Inhale 2 puffs into the lungs 2 (two) times daily. Notes to patient: 05/10/2019   montelukast 10 MG tablet Commonly known as: SINGULAIR TAKE (1) TABLET DAILY AT BEDTIME. What changed: See the new instructions. Notes to patient: 05/10/2019   pantoprazole 40 MG tablet Commonly known as:  PROTONIX Take 1 tablet (40 mg total) by mouth 2 (two) times daily before a meal. What changed: when to take this   pravastatin 20 MG tablet Commonly known as: PRAVACHOL Take 1 tablet (20 mg total) by mouth every evening. Notes to patient: 05/10/2019   predniSONE 10 MG tablet Commonly known as: DELTASONE Take 4 tablets (40 mg total) by mouth daily with breakfast for 2 days, THEN 3 tablets (30 mg total) daily with breakfast for 3 days, THEN 2 tablets (20 mg total) daily with breakfast for 3 days, THEN 1 tablet (10 mg total) daily with breakfast for 3 days. Start taking on: May 10, 2019 What changed: See the new instructions.   Rituxan 100 MG/10ML injection Generic drug: riTUXimab Inject into the vein. Receives at [facility] but unsure of dose - no more doses for next 6 months as of 09/14/18   venlafaxine XR 75 MG 24 hr capsule Commonly known as: EFFEXOR-XR Take 75 mg by mouth at bedtime. Notes to patient: 05/10/2019            Durable Medical Equipment  (From admission, onward)         Start     Ordered   05/10/19 0934  For home use only DME oxygen  Once  Question Answer Comment  Length of Need Lifetime   Mode or (Route) Nasal cannula   Liters per Minute 2   Frequency Continuous (stationary and portable oxygen unit needed)   Oxygen delivery system Gas      05/10/19 0933         Follow-up Information    Brand Males, MD Follow up on 05/22/2019.   Specialty: Pulmonary Disease Why: Appt at 9:45.  Please arrive at 9:30 for check in.   Contact information: [address] [place] [address] [phone]        Shon Baton, MD. Schedule an appointment as soon as possible for a visit in 1 week(s).   Specialty: Internal Medicine Contact information: [place] [address] [phone]        Buford Dresser, MD .   Specialty: Cardiology Contact information: [address] [place]  [address] [phone]        [facility] Follow up.   Why: home oxygen Contact information: [address] [place] [address] [phone]          Allergies  Allergen Reactions  . Penicillins Other (See Comments)    Reaction unknown occurred during childhood Has patient had a PCN reaction causing immediate rash, facial/tongue/throat swelling, SOB or lightheadedness with hypotension: Unknown Has patient had a PCN reaction causing severe rash involving mucus membranes or skin necrosis: Unknown Has patient had a PCN reaction that required hospitalization: No Has patient had a PCN reaction occurring within the last 10 years: No If all of the above answers are "NO", then may proceed with Cephalosporin use.  Unsur  . Remicade [Infliximab] Other (See Comments)    Reaction unknown  . Sulfa Antibiotics Other (See Comments)    Reaction unknown  . Sulfamethoxazole Other (See Comments)    Unsure of reaction As a child not sure     You were cared for by a hospitalist during your hospital stay. If you have any questions about your discharge medications or the care you received while you were in the hospital after you are discharged, you can call the unit and asked to speak with the hospitalist on call if the hospitalist that took care of you is not available. Once you are discharged, your primary care physician will handle any further medical issues. Please note that no refills for any discharge medications will be authorized once you are discharged, as it is imperative that you return to your primary care physician (or establish a relationship with a primary care physician if you do not have one) for your aftercare needs so that they can reassess your need for medications and monitor your lab values.   Procedures/Studies: Dg Chest 2 View  Result Date: 05/06/2019 CLINICAL DATA:  Shortness of breath and cough EXAM: CHEST - 2 VIEW COMPARISON:  CT 04/24/2019, radiograph  12/22/2018 FINDINGS: Coarse interstitial changes throughout the lungs with biapical pleuroparenchymal scarring compatible with a UIP pattern better assessed on high-resolution CT exam performed 04/24/2019. No superimposed consolidative process is seen. No pneumothorax or visible effusion. The aorta is calcified and tortuous. Remaining cardiomediastinal contours are unremarkable. Degenerative changes are present in the imaged spine and shoulders. Nonspecific coarse calcification in the left axilla, unchanged from comparison exams. Surgical clips in the right upper quadrant. IMPRESSION: Coarse interstitial changes throughout the lungs with biapical pleuroparenchymal scarring, compatible with a UIP pattern better assessed on high-resolution CT exam. No superimposed acute cardiopulmonary abnormality. Electronically Signed  By: Lovena Le M.D.   On: 05/06/2019 16:47   Ct Chest High Resolution  Result Date: 04/24/2019 CLINICAL DATA:  Follow-up interstitial lung disease. History of rheumatoid arthritis. Chronic cough. EXAM: CT CHEST WITHOUT CONTRAST TECHNIQUE: Multidetector CT imaging of the chest was performed following the standard protocol without intravenous contrast. High resolution imaging of the lungs, as well as inspiratory and expiratory imaging, was performed. COMPARISON:  03/24/2018 chest CT angiogram. 03/07/2018 high-resolution chest CT. FINDINGS: Cardiovascular: Normal heart size. No significant pericardial effusion/thickening. Left anterior descending coronary atherosclerosis. Atherosclerotic nonaneurysmal thoracic aorta. Normal caliber pulmonary arteries. 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 Mediastinum/Nodes: No discrete thyroid nodules. Unremarkable esophagus. No pathologically enlarged axillary, mediastinal or hilar lymph nodes, noting limited sensitivity for the detection of hilar adenopathy on this noncontrast study.  Lungs/Pleura: No pneumothorax. No pleural effusion. No acute consolidative airspace disease, lung masses or significant pulmonary nodules. No significant lobular air trapping or findings of tracheobronchomalacia on the expiration sequence. There is patchy confluent subpleural reticulation and ground-glass attenuation throughout both lungs with associated mild traction bronchiectasis and mild architectural distortion. There is scattered mild honeycombing in the left upper lobe (series 5/image 93) and right lower lobe (series 5/image 199). No clear apicobasilar gradient to these findings. Findings have progressed mildly since 10/17/2015 and minimally since 03/07/2018 chest CT studies. Upper abdomen: Small hiatal hernia.  Cholecystectomy. Musculoskeletal: No aggressive appearing focal osseous lesions. Mild thoracic spondylosis. IMPRESSION: 1. 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 Findings have progressed since 2017 and 2019 high-resolution chest CT studies. 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 Findings are consistent with UIP per consensus guidelines: Diagnosis of Idiopathic Pulmonary Fibrosis: An Official ATS/ERS/JRS/ALAT Clinical Practice Guideline. [place], Iss 5, [phone], Mar 13 2017. 2. 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 4. Small hiatal hernia. Aortic Atherosclerosis (ICD10-I70.0). Electronically Signed   By: Ilona Sorrel M.D.   On: 04/24/2019 13:29      The results of significant diagnostics from this hospitalization (including imaging, microbiology, ancillary and laboratory) are listed below for reference.     Microbiology: Recent Results (from the past 240 hour(s))  SARS CORONAVIRUS 2 (TAT 6-24 HRS) Nasopharyngeal Nasopharyngeal Swab     Status: None   Collection Time: 05/06/19   5:15 PM   Specimen: Nasopharyngeal Swab  Result Value Ref Range Status   SARS Coronavirus 2 NEGATIVE NEGATIVE Final    Comment: (NOTE) SARS-CoV-2 target nucleic acids are NOT DETECTED. The SARS-CoV-2 RNA is generally detectable in upper and lower respiratory specimens during the acute phase of infection. Negative results do not preclude SARS-CoV-2 infection, do not rule out co-infections with other pathogens, and should not be used as the sole basis for treatment or other patient management decisions. Negative results must be combined with clinical observations, patient history, and epidemiological information. The expected result is Negative. Fact Sheet for Patients: SugarRoll.be Fact Sheet for Healthcare Providers: https://www.woods-mathews.com/ This test is not yet approved or cleared by the [place] FDA and  has been authorized for detection and/or diagnosis of SARS-CoV-2 by FDA under an Emergency Use Authorization (EUA). This EUA will remain  in effect (meaning this test can be used) for the duration of the COVID-19 declaration under Section 56 4(b)(1) of the Act, 21 U.S.C. section 360bbb-3(b)(1), unless the authorization is terminated or revoked sooner.  Performed at [facility], [place] [address] [place], [place] [address]      Labs: BNP (last 3 results) No results for input(s): BNP in the last 8760 hours. Basic Metabolic Panel: Recent Labs  Lab 05/06/19 1715  NA 139  K 4.2  CL 100  CO2 29  GLUCOSE 120*  BUN 11  CREATININE 0.64  CALCIUM 8.9   Liver Function Tests: No results for input(s): AST, ALT, ALKPHOS, BILITOT, PROT, ALBUMIN in the last 168 hours. No results for input(s): LIPASE, AMYLASE in the last 168 hours. No results for input(s): AMMONIA in the last 168 hours. CBC: Recent Labs  Lab 05/06/19 1715  WBC 13.9*  NEUTROABS 12.0*  HGB 12.9  HCT 41.5  MCV 93.7  PLT 314   Cardiac Enzymes: No  results for input(s): CKTOTAL, CKMB, CKMBINDEX, TROPONINI in the last 168 hours. BNP: Invalid input(s): POCBNP CBG: No results for input(s): GLUCAP in the last 168 hours. D-Dimer No results for input(s): DDIMER in the last 72 hours. Hgb A1c No results for input(s): HGBA1C in the last 72 hours. Lipid Profile No results for input(s): CHOL, HDL, LDLCALC, TRIG, CHOLHDL, LDLDIRECT in the last 72 hours. Thyroid function studies No results for input(s): TSH, T4TOTAL, T3FREE, THYROIDAB in the last 72 hours.  Invalid input(s): FREET3 Anemia work up No results for input(s): VITAMINB12, FOLATE, FERRITIN, TIBC, IRON, RETICCTPCT in the last 72 hours. Urinalysis    Component Value Date/Time   COLORURINE YELLOW 12/18/2009 0120   APPEARANCEUR CLEAR 12/18/2009 0120   LABSPEC 1.006 12/18/2009 0120   PHURINE 7.0 12/18/2009 0120   GLUCOSEU NEGATIVE 12/18/2009 0120   HGBUR NEGATIVE 12/18/2009 0120   BILIRUBINUR NEGATIVE 12/18/2009 0120   KETONESUR NEGATIVE 12/18/2009 0120   PROTEINUR NEGATIVE 12/18/2009 0120   UROBILINOGEN 0.2 12/18/2009 0120   NITRITE NEGATIVE 12/18/2009 0120   LEUKOCYTESUR TRACE (A) 12/18/2009 0120   Sepsis Labs Invalid input(s): PROCALCITONIN,  WBC,  LACTICIDVEN Microbiology Recent Results (from the past 240 hour(s))  SARS CORONAVIRUS 2 (TAT 6-24 HRS) Nasopharyngeal Nasopharyngeal Swab     Status: None   Collection Time: 05/06/19  5:15 PM   Specimen: Nasopharyngeal Swab  Result Value Ref Range Status   SARS Coronavirus 2 NEGATIVE NEGATIVE Final    Comment: (NOTE) SARS-CoV-2 target nucleic acids are NOT DETECTED. The SARS-CoV-2 RNA is generally detectable in upper and lower respiratory specimens during the acute phase of infection. Negative results do not preclude SARS-CoV-2 infection, do not rule out co-infections with other pathogens, and should not be used as the sole basis for treatment or other patient management decisions. Negative results must be combined with  clinical observations, patient history, and epidemiological information. The expected result is Negative. Fact Sheet for Patients: SugarRoll.be Fact Sheet for Healthcare Providers: https://www.woods-mathews.com/ This test is not yet approved or cleared by the [place] FDA and  has been authorized for detection and/or diagnosis of SARS-CoV-2 by FDA under an Emergency Use Authorization (EUA). This EUA will remain  in effect (meaning this test can be used) for the duration of the COVID-19 declaration under Section 56 4(b)(1) of the Act, 21 U.S.C. section 360bbb-3(b)(1), unless the authorization is terminated or revoked sooner. Performed at [facility], [place] [address], [place], [place] [address]      Time coordinating discharge:  I have spent 35 minutes face to face with the patient and on the ward discussing the patients care, assessment, plan and disposition with other care givers. >50% of the time  was devoted counseling the patient about the risks and benefits of treatment/Discharge disposition and coordinating care.   SIGNED:   Damita Lack, MD  [facility] 05/10/2019, 10:34 AM   If 7PM-7AM, please contact night-coverage

## 2019-05-10 NOTE — Progress Notes (Signed)
Patient has discharged to home on 05/10/2019. Discharge instruction including medication and appointment was given to patient. CM set up [facility] oxygen. Waiting for oxygen tank to be delivered before leaving the hospital. Patient has no question at this time.

## 2019-05-10 NOTE — Care Management Important Message (Signed)
Important Message  Patient Details IM Letter given to Dessa Phi RN to present to the Patient Name: Stacey [name] MRN: [id number] Date of Birth: 1941-04-01   Medicare Important Message Given:  Yes     Kerin Salen 05/10/2019, 9:20 AM

## 2019-05-10 NOTE — TOC Transition Note (Signed)
Transition of Care [facility]) - CM/SW Discharge Note   Patient Details  Name: Stacey [name] MRN: [id number] Date of Birth: [birth year]  [facility] of [facility]) CM/SW Contact:  Dessa Phi, RN Phone Number: 05/10/2019, 9:25 AM   Clinical Narrative: Spoke to patient in rm-she already has [employer] forhome 02 & has travel tank for home,spouse will bring. Informed [employer] Zack about additional tanks to equal requirement for home. No further CM needs.        Barriers to Discharge: No Barriers Identified   Patient Goals and CMS Choice        Discharge Placement                       Discharge Plan and Services                DME Arranged: Oxygen DME Agency: [employer] Date DME Agency Contacted: 05/10/19 Time DME Agency Contacted: [phone] Representative spoke with at DME Agency: [email or web address]            Social Determinants of Health ([place]) Interventions     Readmission Risk Interventions No flowsheet data found.

## 2019-05-11 ENCOUNTER — Telehealth: Payer: Self-pay

## 2019-05-11 NOTE — Telephone Encounter (Signed)
Patient scheduled for office visit with Alonza Bogus PA on 05/22/19. F/U from hospital admission.

## 2019-05-16 ENCOUNTER — Other Ambulatory Visit: Payer: Self-pay | Admitting: *Deleted

## 2019-05-16 NOTE — Patient Outreach (Signed)
[facility]) Care Management  05/16/2019  Stacey [name] 08-07-1940 [id number]    EMMI-GENERAL DISCHARGE RED ON EMMI ALERT Day # 4 Date: 11/2/20202 Red Alert Reason: Sad/Hopless/anxious/empty   Outreach #1 RN spoke with pt today and explained the purpose for today's call. Reviewed the noted EMMI red flag. Pt verified she is not depressed and the information was translated incorrectly. Pt denies any of the above symptoms with no sadness/hopless/anxious/empty. RN inquired on any other issues based upon her recent discharge (none mentioned). RN also verified pt has scheduled appointments with her providers and has obtained all her prescribed medications with no issues with her medications. No needs at this time.  PLAN: Will closed this case with a resolved RED FLAG EMMI report.  Raina Mina, RN Care Management Coordinator [place] Office [phone]

## 2019-05-17 DIAGNOSIS — N898 Other specified noninflammatory disorders of vagina: Secondary | ICD-10-CM | POA: Diagnosis not present

## 2019-05-19 DIAGNOSIS — I10 Essential (primary) hypertension: Secondary | ICD-10-CM | POA: Diagnosis not present

## 2019-05-19 DIAGNOSIS — R0609 Other forms of dyspnea: Secondary | ICD-10-CM | POA: Diagnosis not present

## 2019-05-19 DIAGNOSIS — M0510 Rheumatoid lung disease with rheumatoid arthritis of unspecified site: Secondary | ICD-10-CM | POA: Diagnosis not present

## 2019-05-19 DIAGNOSIS — J849 Interstitial pulmonary disease, unspecified: Secondary | ICD-10-CM | POA: Diagnosis not present

## 2019-05-19 DIAGNOSIS — J449 Chronic obstructive pulmonary disease, unspecified: Secondary | ICD-10-CM | POA: Diagnosis not present

## 2019-05-19 DIAGNOSIS — I700 Atherosclerosis of aorta: Secondary | ICD-10-CM | POA: Diagnosis not present

## 2019-05-19 DIAGNOSIS — M8580 Other specified disorders of bone density and structure, unspecified site: Secondary | ICD-10-CM | POA: Diagnosis not present

## 2019-05-19 DIAGNOSIS — J45909 Unspecified asthma, uncomplicated: Secondary | ICD-10-CM | POA: Diagnosis not present

## 2019-05-19 DIAGNOSIS — R05 Cough: Secondary | ICD-10-CM | POA: Diagnosis not present

## 2019-05-19 DIAGNOSIS — M069 Rheumatoid arthritis, unspecified: Secondary | ICD-10-CM | POA: Diagnosis not present

## 2019-05-19 DIAGNOSIS — J9691 Respiratory failure, unspecified with hypoxia: Secondary | ICD-10-CM | POA: Diagnosis not present

## 2019-05-19 DIAGNOSIS — D8989 Other specified disorders involving the immune mechanism, not elsewhere classified: Secondary | ICD-10-CM | POA: Diagnosis not present

## 2019-05-22 ENCOUNTER — Ambulatory Visit: Payer: Medicare Other | Admitting: Gastroenterology

## 2019-05-22 ENCOUNTER — Ambulatory Visit: Payer: Medicare Other | Admitting: Internal Medicine

## 2019-05-22 ENCOUNTER — Telehealth: Payer: Self-pay

## 2019-05-22 NOTE — Telephone Encounter (Signed)
Called patient and she agreed to move her office visit to 05/31/19 at 10:00am with Ellouise Newer PA. (her 05/22/19 appt. With Alonza Bogus PA caused an overbooking)

## 2019-05-24 ENCOUNTER — Telehealth: Payer: Self-pay | Admitting: Internal Medicine

## 2019-05-24 ENCOUNTER — Ambulatory Visit (INDEPENDENT_AMBULATORY_CARE_PROVIDER_SITE_OTHER): Payer: Medicare Other | Admitting: Internal Medicine

## 2019-05-24 ENCOUNTER — Encounter: Payer: Self-pay | Admitting: Internal Medicine

## 2019-05-24 ENCOUNTER — Other Ambulatory Visit: Payer: Self-pay

## 2019-05-24 VITALS — BP 116/60 | HR 81 | Ht 66.0 in | Wt 143.8 lb

## 2019-05-24 DIAGNOSIS — Z79899 Other long term (current) drug therapy: Secondary | ICD-10-CM | POA: Diagnosis not present

## 2019-05-24 DIAGNOSIS — B0229 Other postherpetic nervous system involvement: Secondary | ICD-10-CM | POA: Diagnosis not present

## 2019-05-24 DIAGNOSIS — M359 Systemic involvement of connective tissue, unspecified: Secondary | ICD-10-CM

## 2019-05-24 DIAGNOSIS — I2510 Atherosclerotic heart disease of native coronary artery without angina pectoris: Secondary | ICD-10-CM

## 2019-05-24 DIAGNOSIS — R053 Chronic cough: Secondary | ICD-10-CM

## 2019-05-24 DIAGNOSIS — R05 Cough: Secondary | ICD-10-CM

## 2019-05-24 DIAGNOSIS — J45909 Unspecified asthma, uncomplicated: Secondary | ICD-10-CM

## 2019-05-24 DIAGNOSIS — J8489 Other specified interstitial pulmonary diseases: Secondary | ICD-10-CM

## 2019-05-24 NOTE — Progress Notes (Signed)
Brief patient profile:  46 yowf  never smoker with allergies/inhalers as child outgrew by [employer] then  RA since around 2000  Prednisone  x decades and prev eval by Dr Joya Gaskins around 2004 for sob resolved s maint rx and referred 05/26/2013 by Dr Shelia Media for bronchitis and abn cxr   History of Present Illness  05/26/2013 1st [facility] office visit/ Wert cc June 2014 dx pna  In [place] and remicade stopped and 100% better and placed arencia in September 2014  then abruptly worse first week in November with cough green sputum s nasal symptoms, fever low grade and no cp or cough and completely recovered prior to [place] does not recall abx but issue is why keeps getting sick and abn CT Chest (see below).   Arthritis symptoms well controlled at present on Rx for RA rec Nexium 40 mg Take 30-60 min before first meal of the day and add pepcid 20 mg one at bedtime whenever coughing.     10/19/2017  f/u ov/Wert re:  RA lung dz  Chief Complaint  Patient presents with   Follow-up    Cough is much improved, but has not resolved yet. Cough is non prod. She has not had to use her neb.   Dyspnea:  Not limited by breathing from desired activities  But some doe x steps Cough: daytime > noct dry  Sleep: fine  SABA use:  No saba Medrol 4 mg a/w 17m per day/ ok control of arthritis  rec Start back on gabapentin up to 300 mg each am  in addition to the the two at bedtime  If not better increase the medrol to 8 mg daily until bettter then taper back to where you      01/10/2018  f/u ov/Wert re:  RA  Lung dz Medrol  4 mg  One alternating with a half Chief Complaint  Patient presents with   Follow-up    PFT's done. Her breathing has been gradually worsening since the last visit. She has occ cough- non prod.   Dyspnea gradually worse since last ov:  MMRC1 =  MMRC3 = can't walk 100 yards even at a slow pace at a flat grade s stopping due to sob    Gradually x 3 m / more fatigue / no change in  arthritis  Cough: not an issue rec Protonix 40 mg Take 30-60 min before first meal of the day  GERD diet   01/17/2018 acute extended ov/Wert re: cough on medrol 4 mg  One a/w one half  Chief Complaint  Patient presents with   Acute Visit    started coughing 01/11/18- occ prod with minimal green sputum.  She states also wheezing and having increased SOB.    abruptly worse 01/11/18 with severe 24/7 coughing >>  prod min green mucus esp in am/ assoc with subjective wheeze and did not follow previous contingencies re flutter / saba/ increase medrol and admits she does not rember those written instructions nor how to use the neb provided .  No fever/ comfortable at rest sitting  rec For cough > mucinex dm 1200 mg every 12 hours and cough into the flutter valve as much as possible  Doxycycline 100 mg twice daily x 10 days with glass of water Medrol 436mx 2 now and take 2  daily until cough is better then 1 daily x 5 days and then resume the previous dose  Shortness of breath/ wheezing/  still coughing > albuterol neb every 4 hours as needed      01/20/2018 acute extended ov/Wert re: refractory cough and sob 01/11/18 Chief Complaint  Patient presents with   Acute Visit    she is not feeling better, coughing , very SOB, wheezing  mucus now clear/scant  on doxy/ neb machine not working (tube would not plug into the side s adequate force and she was not capable of applying it due to RA hands. Cough/ wheeze/ sob 24/7 / flutter not helping/ can't lie down at hs   rec While coughing protonix 40 mg Take 30- 60 min before your first and last meals of the day  Shortness of breath/ wheezing/ still coughing > albuterol neb every 4 hours as needed  Depomedrol 120 mg IM and medrol 32 mg daily x 2 days,  then 16 mg x 3 days,  Then 8 mg x 4 days , then resume the 4 mg daily  For severe cough > tylenol 3# one every 4 hours if needed  Go to ER if condition worsens on above plan       Date of admission:  01/22/2018             Date of discharge: 01/27/2018   History of present illness: As per the H and P dictated on admission, "[name] a77 y.o.female,w Rheumatoid arthritis, ILD Asthma, apparently c/o increase in dyspnea this evening. Dry cough. Denies fever, chills, cp, palp, N/v, diarrhea, brbpr, black stool. Pt notes recently being given steroid injection in office as well as being placed on doxycycline. This might have helped slightly but pt worse Hospital Course:  Summary of her active problems in the hospital is as following. 1 dyspnea/hypoxemia/ILD Concerned that likely GERD Is causing ILD. Patient with cough.  Patient with complaints of awakening with cough and also with oral intake which is slightly improved since 01/24/2018. assessed by speech therapy and speech therapy raising concern of esophageal component but no signs of aspiration.  2D echo with a EF of 55 to 60% with no wall motion abnormalities, grade 1 diastolic dysfunction.  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 Pulmonary felt that the patient should be on scheduled Reglan. I have placed the patient on scheduled potassium before sleep. Continue steroids on discharge continue Mucinex and Claritin as well as inhalers. Patient will follow-up with pulmonary outpatient  2. Gastroesophageal reflux disease Continue PPI and H2 blocker.I changed PPI to [facility].  3. Rheumatoid arthritis Outpatient follow-up.   4. Anxiety Continue Effexor.    All other chronic medical condition were stable during the hospitalization.  Patient was ambulatory without any assistance. On the day of the discharge the patient's vitals were stable , and no other acute medical condition were reported by patient. the patient was felt safe to be discharge at home with family.  Consultants: PCCM  Procedures: Echocardiogram      03/21/2018  f/u ov/Wert re:   S/p admit was  transiently better  and downhill since Labor day on medrol 4 mg daily  Last orencia on Sept 4th 2019  Chief Complaint  Patient presents with   Acute Visit    Per patient, she has had a dry cough since July 2019. She has been wheezing as well. Increased fatigue. Body aches. Denies any fever or chills.   Dyspnea:  MMRC4  = sob if tries to leave home or while getting dressed   Cough: harsh/ hacking mostly dry/ has flutter not using  SABA use: not much better with rx   No obvious day to day or daytime variability or assoc excess/ purulent sputum or mucus plugs or hemoptysis or cp or chest tightness, subjective wheeze or overt sinus or hb symptoms.     Also denies any obvious fluctuation of symptoms with weather or environmental changes or other aggravating or alleviating factors except as outlined above   No unusual exposure hx or h/o childhood pna/ asthma or knowledge of premature birth.   INpatient consult 03/26/18 77 year old with rheumatoid arthoritis.At baseline the patient lives at home with her husband and is independent of ADLs.  Has been on many immune suppressants over decades and curently on orencia x 4 year and prednisone. Does not recollect being on bactrim/dapsone. Chart mentions BOOP/MAI in 2001 but she denies this. 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 Got admitted. Rx with steroids. Per Notes - clnical suspicion of  arytenoid inflmmation related wheeze noticed (she also reports asthma NOS). Follolwup with ENT recommended (but not seen one as yet). She also appears to have passed swallow with rec for regular diet with thin liquids but did to have mild eso stricture and reflux during testing . PFT shows 10% FVC decline for first ime. CT chest at this tme (aug 2019) showed new rLL infiltrate. ECHO July 2019 without evicence of  elevated PASP and saw cards [facility] June 2019 and was considered to have worsenin dyspnea due to [facility] issues (reports stress test at [facility] that was normal but I cannot see it)  She reports after discharge she got better but in last several weeks has deteriorated with cough and dyspnea. There is new hypoxemia (currently RA with nail polish and poor circulation - 89% pulse ox) needing 2L [place]. Per [facility] improved with steroids and abx. CTA 03/24/2018 => shows that RLL inifltrate has improved . Other chronic ILD changes + and small  Hiatal hernia + witthout change.  Review of lab work does show eosinophilia at time of admision  EVENTS 03/21/18 - IgE -5, blood allegy panel - negative, 03/23/2018 - - admit . HIGH EOS 2300, ESR 48, BNP 89 , HIV neg 9/12- PCT negative, RVP negative 9/14 -  PCT < 0.1, Urine strep - negative, MRSA PCR - positive. IgE - normal 4, Blood allergy panel repeat - negative 03/26/2018 - leading consideration for airway (BO in RA +/- asthma) related flare either due to MRSA bronchitis or clinical suspicion of arytenoid inflmmation +/- GERD relatd flare (she has small hiatal hernia)  +/- ? Dysphagia  up causing mild hypoxemia acute resp failure, wheeze . Allergy and IgE blood work negative thought. Patient reports being better but says she is choking on drinkin water [facility] MD says wheeze improved significantly with steroids.  RN says was down to RA yesterday evening but needed 1L [place] at sleep. Today -Room air at rest 94% and desaturated to 86% walking 60 feet 03/27/2018  - better. Off o2 at rest. STill coughs with water and when lies down.  Husband at bedside. Both requesting ILD clinic followup . Desaturated t 79% walking 90 feet.   OV 04/12/2018  Subjective:  Patient ID: Tamala Fothergill, female , DOB: 1940-10-11 , age 77 y.o. , MRN: [id number] , ADDRESS: [place] [address]   04/12/2018 -  Chief Complaint  Patient presents with   Consult    Pt is a former  [facility] pt.  Pt denies any current complaints of cough, SOB, or CP but states the cough she originally had ended her up in the hosp 9/11-9/17 with dx acute respiratory failure. Pt does wear 2pulse with exertion and also wears 2L continuous when at home.     HPI DEONDRIA PURYEAR 78 y.o. -presents for follow-up to the ILD clinic.  She is known to have rheumatoid arthritis with ILD changes.  She had been followed by Dr. Christinia Gully.  However in July 2019 in September 2019 she has had 2 admissions to the hospital with respiratory distress and hypoxemic respiratory failure.  In the first 1 that seem to be right lower lobe infiltrate and then she improved from it but in the second 1 even though the right lower lobe infiltrates were better she still was hypoxemic.  Acid reflux and dysphagia was considered a possible etiology but she passed swallow study 2 times.  They thought she had some reflux.  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  At the same time it is not clear if the ILD is progressive based on pulmonary function testing below   At this point in time she tells me that she is getting home physical therapy.  Her fatigue is improving but it is not fully resolved.  She was discharged on continuous oxygen which she is using.  However she is feeling less short of breath.  Today in fact when we turned her oxygen off and walked her she did not desaturate and this is a significant improvement.  She is on monthly Orencia through the [facility] rheumatologist Dr. Eda Paschal.  At this point in time she is put the Orencia on hold.  She told me that she is been on Orencia for 4 years and never had a respiratory exacerbation still recently x 2.  Although before going on Orencia she had pneumonia while on Remicade and the Remicade.  In terms of her rheumatoid arthritis she hardly has any pain.  Her joint architecture is fairly well-preserved because of various  immunomodulators over time.  She says that she was on Remicade for years and when she stopped it for 8 weeks before the switch to [place] she never really had a relapse in her rheumatoid arthritis.  She is largely pain and stiffness free.  She believes she can go without  her Orencia for a while.  Review of the literature shows greater than 10% chance of a respiratory infection especially COPD exacerbation.  Although the time frame for this is unclear.       OV 06/07/2018  Subjective:  Patient ID: Tamala Fothergill, female , DOB: 08-Apr-1941 , age 56 y.o. , MRN: [id number] , ADDRESS: [place] [id number]   06/07/2018 -   Chief Complaint  Patient presents with   Follow-up    ILD, PFT done today, some wheezing and coughing but better tha before   Rheumatoid arthritis ILD and asthma/obstructive lung disease phenotype on Dulera  HPI NATHALIA WISMER 78 y.o. -presents for routine follow-up with her husband.  She is here to follow-up with [facility] Dr. Stann Mainland.  She plans to do this in December 2019.  She continues to be off Orencia.  Her joints are slowly getting stiff again.  She believes that she will need to be back on immunosuppression agent again.  She  currently continues Medrol 4 mg alternating with 2 mg.  This for her rheumatoid arthritis.  In terms of her joints she continues on Medrol 4 mg alternating with 2 mg but not on any other immunosuppression agent.  Overall she is been stable but for the last 2 weeks has had green sputum and wheezing and chest congestion and cough.  She recently visited her husband who was hospitalized and walking the long hallways at [facility] made a short of breath but she thinks this is probably baseline for her.  There are no other new issues.  She did have spirometry and DLCO and this shows a decline compared to September 2019 and a similar to July 2019.  It is documented below.  This is probably reflective of a  flareup   OV 07/21/2018  Subjective:  Patient ID: Tamala Fothergill, female , DOB: 30-Mar-1941 , age 53 y.o. , MRN: [id number] , ADDRESS: [place] [address]   07/21/2018 -   Chief Complaint  Patient presents with   Follow-up    Pt states she has been doing well since last visit. States she is about to begin Rituxan with [facility]. Pt still becomes SOB with exertion. Denies any complaints of cough or CP.   Rheumatoid arthritis ILD and asthma/obstructive lung disease phenotype on Dulera  HPI NIESHIA LARMON 78 y.o. -presents for follow-up of the above.  Last seen just before Thanksgiving 2019.  In the interim overall stable although on June 23, 2018 she climbs a steep flight of stairs which is unusual exertion for her and she became very dyspneic.  Following day saw Dr. Stann Mainland at [facility] and was given Z-Pak and prednisone and started feeling better.  Although it is not fully clear to me she had fever and bronchitic symptoms.  I reviewed Dr. Stann Mainland note.  Dr. Stann Mainland is decided to start Rituxan for rheumatoid arthritis.  She is only having some minimal joint pain at this point.  She is off Orencia and continues to be off [place].  She did have some blood work with [place] before starting Rituxan.  She is due to see Dr. Stann Mainland within the next week and start her Rituxan.  Her liver function test July 18, 2017 is normal hemoglobin is normal.  CRP is also normal.  We did spirometry and walking desaturation test.  These show improvement compared to before and these are documented below.  Currently she not using nighttime or daytime oxygen.  She is wondering if she could switch rheumatology care to [facility].  This is because while she likes [employer] she is getting older and more frail and feels some body local would be of help.  I have sent a message to Dr. Estanislado Pandy inquiring.  Certainly we can help her with Rituxan infusions at [facility]  if needed.  She will check on this with her [facility] rheumatologist.       OV 03/27/2019  Subjective:  Patient ID: Tamala Fothergill, female , DOB: 02-25-1941 , age 35 y.o. , MRN: [id number] , ADDRESS: [place] [address]  Rheumatoid arthritis ILD and asthma/obstructive lung disease phenotype on [facility]   03/27/2019 -  Rourine fu   HPI PRISCILLA KIRSTEIN 78 y.o. -presents for the above.  Last seen in January 2020.  After that she has seen Dr. Stann Mainland rheumatology at [facility].  She is now getting Rituxan 2 doses every 6 months.  She says  this is helped her joints and her stiffness.  She is a little bit more mobile than usual.  However in terms of her respiratory status she continues to have episodic cough.  In June 2020 she again got hypoxemic and got admitted.  Since then she has had episodic cough.  She had a respiratory exacerbation in June 2020 for the admission she got steroids.  This seemed to help.  She is also on a higher dose Dulera right now.  In terms of her cough this seems to be her biggest problem.  She seems to be on Dulera, Singulair scheduled with also Tessalon and Delsym and DuoNeb.  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  Her last high-resolution CT scan of the chest was 1 year ago.  She says the dyspnea itself is not worse.  She is currently not using oxygen.  On exam she did have some wheezing.  Currently she has white and brown sputum but this is baseline.  In terms of a COVID wrist she has been tested recently couple of times and this is been negative.  She is isolating well.  She wanted to know COVID prevention activities and risk status and masking strategies.      OV 05/24/2019  Subjective:  Patient ID: Tamala Fothergill, female , DOB: [birth year] , age 11 y.o. , MRN: [id number] , ADDRESS: [place]  [id number]   05/24/2019 -   Chief Complaint  Patient presents with   Follow-up    Pt was recently in the hosp due to ILD. Pt states that she has been better since being out of the hosp.   Rheumatoid arthritis ILD and asthma/obstructive lung disease phenotype on Dulera/Singulair  Post herpetic neuralgia - on elavil, gabapentin, effexor  HPI TRISHA KEN 78 y.o. -returns for follow-up.  At the last visit approximately 2 months ago she was reporting worsening cough following an admission in summer 2020.  Her pulmonary function test suggested worsening ILD status.  Therefore we requested a high-resolution CT chest which she did in October 2020.  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  However in the interim after the CT scan was done towards the end of October 2020 she developed worsening of her cough over 2 weeks and also associated shortness of breath but significantly the cough is much worse.  She ended up getting admitted to the hospital.  There was some hypoxemia.  By this time she had finished a ENT evaluation that did not show any involvement of the arytenoids.  Pulmonary was consulted.  She was given a prednisone burst which she just finished I believe yesterday.  She is back on her baseline Medrol.  And she is feeling better.  Her oxygen status is improved although she is using oxygen at night now.  She is really frustrated with these recurrent flareups and these admissions which ended up with her having a wheeze and also hypoxemia.  Currently she is on her baseline Medrol for rheumatoid arthritis associated with Dulera and Singulair.  She is also on losartan for blood pressure.  Her walking desaturation test is slightly worse than baseline.  She has a GI consult pending because of the recurrent episodes of cough and flareups.  We went over exposure history.  We used interstitial lung disease questionnaire for the exposure history.  Specifically she denies any  electronic cigarette use of marijuana use of cocaine use or any IV drug abuse.  [place] the last 14 years.  Asked extensive questions about the home environment it is positive for nebulizer use but the nebulizer does not have mildew or mold in it.  Otherwise no organic antigen exposure.  The house is not damp.  There is no mold or mildew in the shower curtain.  There is no humidifier use no steam iron use.  No Jacuzzi use.  No misting [place] outside to inside the house.  No pet birds.  No pet gerbils no feather pillows.  There is no mold in the [facility] duct.  She does not do any gardening.  Does not use wind instruments.  Also 122 question occupational history elicited and essentially negative.  The other issue is that she has polypharmacy.  She is asking for my help in reducing her medications.  She is on 3 medications for postherpetic neuralgia.  She is on losartan    SYMPTOM SCALE - ILD 03/27/2019  05/24/2019   O2 use ra   Shortness of Breath 0 -> 5 scale with 5 being worst (score 6 If unable to do)   At rest 0 2  Simple tasks - showers, clothes change, eating, shaving 0 3  Household (dishes, doing bed, laundry) 2 4  Shopping 2 3  Walking level at own pace 2 3  Walking keeping up with others of same age 48 4  Walking up Stairs 3 3  Walking up [place] 3 4  Total (40 - 48) Dyspnea Score 15 26  How bad is your cough? 2 2  How bad is your fatigue 3 3.5    Results for THERSA, MOHIUDDIN (MRN [id number]) as of 03/27/2019 09:43  Ref. Range 07/17/2013 09:53 02/07/2016 13:09 01/11/2017 11:05 01/10/2018 10:13 05/17/2018 14:24 06/07/2018 10:57 07/21/2018 10:53 03/27/2019 09:02  FVC-Pre Latest Units: L 2.13 2.16 2.14 1.93 1.99 1.81 2.18 1.79  FVC-%Pred-Pre Latest Units: % 68 71 71 65 75 68 77 64   Results for GERALDYN, SHAIN (MRN [id number]) as of 03/27/2019 09:43  Ref. Range 07/17/2013 09:53 02/07/2016 13:09 01/11/2017 11:05 01/10/2018 10:13 05/17/2018 14:24 06/07/2018  10:57 07/21/2018 10:53 03/27/2019 09:02  DLCO unc Latest Units: ml/min/mmHg 18.85 18.25 16.50 16.09 16.58 14.06  15.04  DLCO unc % pred Latest Units: % 69 67 61 59 70 59  77    Simple office walk  feet x  3 laps goal with forehead probe 04/12/2018  07/21/2018  05/24/2019   O2 used Room air - off o2 x 10 min Room air  Room air  Number laps completed 3 x 185 feet 3 x 250 feet   Comments about pace normal normal   Resting Pulse Ox/HR 99% and 72/min 97% and 74/min 97% and 81/min  Final Pulse Ox/HR 98% and 95/min 96% and 96/min 94% and 107/min  Desaturated </= 88% no no no  Desaturated <= 3% points no no Yes, 3 pints  Got Tachycardic >/= 90/min yes yes yes  Symptoms at end of test No complaint Mild dyspnea Mod dyspnea  Miscellaneous comments improed from hospital Same v improved woprse    IMPRESSION: HRCT OCt 2020 1. 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 Findings have progressed since 2017 and 2019 high-resolution chest CT studies. 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 Findings are consistent with UIP per consensus guidelines: Diagnosis of Idiopathic Pulmonary Fibrosis: An Official ATS/ERS/JRS/ALAT Clinical Practice Guideline. [place], Iss 5, [phone], Mar 13 2017. 2. 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 4. Small hiatal hernia.  Aortic Atherosclerosis (ICD10-I70.0).   Electronically Signed   By: Ilona Sorrel M.D.   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   reports that she has never smoked. She has never used smokeless tobacco.  Past Surgical History:  Procedure Laterality Date   CHOLECYSTECTOMY  2011   WISDOM TOOTH EXTRACTION      Allergies  Allergen Reactions   Penicillins Other (See Comments)    Reaction unknown occurred during childhood Has patient had a PCN reaction causing immediate rash, facial/tongue/throat swelling, SOB or lightheadedness with hypotension: Unknown Has patient had a PCN reaction causing severe rash involving mucus membranes or skin necrosis: Unknown Has patient had a PCN reaction that required hospitalization: No Has patient had a PCN reaction occurring within the last 10 years: No If all of the above answers are "NO", then may proceed with Cephalosporin use.  Unsur   Remicade [Infliximab] Other (See Comments)    Reaction unknown   Sulfa Antibiotics Other (See Comments)    Reaction unknown   Sulfamethoxazole Other (See Comments)    Unsure of reaction As a child not sure     Immunization History  Administered Date(s) Administered   Influenza Split 04/12/2013, 04/12/2014, 03/14/2015, 04/23/2016   Influenza, High Dose Seasonal PF 04/12/2017, 03/24/2018, 04/13/2019   Influenza-Unspecified 04/03/2013, 04/22/2017   Pneumococcal Conjugate-13 04/03/2013, 09/28/2014   Pneumococcal Polysaccharide-23 07/13/2012, 07/24/2013   Tdap 12/10/2017    Family History  Problem Relation Age of Onset   Asthma Mother    Anemia Mother    Polymyalgia rheumatica Mother    COPD Father    Pulmonary fibrosis Father    Breast cancer Maternal Grandmother 88     Current Outpatient Medications:    albuterol (VENTOLIN HFA) 108 (90 Base) MCG/ACT inhaler, Inhale 2 puffs into the lungs every 6 (six) hours as needed for wheezing or shortness of breath., Disp: 18 g, Rfl: 0   amitriptyline (ELAVIL) 25 MG tablet, Take 25 mg by mouth at bedtime., Disp: , Rfl:  0   benzonatate (TESSALON) 200 MG capsule, Take 1 capsule (200 mg total) by mouth 3 (three) times daily as needed for cough., Disp: 20 capsule, Rfl: 2   beta carotene w/minerals (OCUVITE) tablet, Take 1 tablet by mouth daily., Disp: , Rfl:    chlorpheniramine-HYDROcodone (TUSSIONEX) 10-8 MG/5ML SUER, Take 5 mLs by mouth every 12 (twelve) hours as needed for cough (Not releaved by Delsym  or Robitussin.)., Disp: 140 mL, Rfl: 0   cholecalciferol (VITAMIN D) 1000 UNITS tablet, Take 1,000 Units by mouth daily., Disp: , Rfl:    dextromethorphan (DELSYM) 30 MG/5ML liquid, Take 2.5 mLs (15 mg total) by mouth at bedtime., Disp: 89 mL, Rfl: 0   gabapentin (NEURONTIN) 300 MG capsule, Take 600 mg by mouth at bedtime., Disp: , Rfl:    ipratropium-albuterol (DUONEB) 0.5-2.5 (3) MG/3ML SOLN, Take 3 mLs by nebulization 3 (three) times daily.,  Disp: 360 mL, Rfl: 0   losartan (COZAAR) 25 MG tablet, Take 25 mg by mouth daily., Disp: , Rfl: 11   mometasone-formoterol (DULERA) 100-5 MCG/ACT AERO, Inhale 2 puffs into the lungs 2 (two) times daily., Disp: 13 g, Rfl: 0   pantoprazole (PROTONIX) 40 MG tablet, Take 1 tablet (40 mg total) by mouth 2 (two) times daily before a meal., Disp: 180 tablet, Rfl: 3   pravastatin (PRAVACHOL) 20 MG tablet, Take 1 tablet (20 mg total) by mouth every evening., Disp: 90 tablet, Rfl: 3   riTUXimab (RITUXAN) 100 MG/10ML injection, Inject into the vein. Rece[facility] at [facility] but unsure of dose - no more doses for next 6 months as of 09/14/18, Disp: , Rfl:    venlafaxine XR (EFFEXOR-XR) 75 MG 24 hr capsule, Take 75 mg by mouth at bedtime. , Disp: , Rfl:       Objective:   Vitals:   05/24/19 0950  BP: 116/60  Pulse: 81  SpO2: 97%  Weight: 143 lb 12.8 oz (65.2 kg)  Height: '5\' 6"'$  (1.676 m)    Estimated body mass index is 23.21 kg/m as calculated from the following:   Height as of this encounter: '5\' 6"'$  (1.676 m).   Weight as of this encounter: 143 lb 12.8 oz (65.2  kg).  '@WEIGHTCHANGE'$ @  [employer]   05/24/19 0950  Weight: 143 lb 12.8 oz (65.2 kg)     Physical Exam  General Appearance:    Alert, cooperative, no distress, appears stated age - older , Deconditioned looking - no , OBESE  - no, Sitting on Wheelchair -  no  Head:    Normocephalic, without obvious abnormality, atraumatic  Eyes:    PERRL, conjunctiva/corneas clear,  Ears:    Normal TM's and external ear canals, both ears  Nose:   Nares normal, septum midline, mucosa normal, no drainage    or sinus tenderness. OXYGEN ON  - no . Patient is @ ra   Throat:   Lips, mucosa, and tongue normal; teeth and gums normal. Cyanosis on lips - no  Neck:   Supple, symmetrical, trachea midline, no adenopathy;    thyroid:  no enlargement/tenderness/nodules; no carotid   bruit or JVD  Back:     Symmetric, no curvature, ROM normal, no CVA tenderness  Lungs:     Distress - no , Wheeze no, Barrell Chest - no, Purse lip breathing - no, Crackles - yes at lung base and apex   Chest Wall:    No tenderness or deformity.    Heart:    Regular rate and rhythm, S1 and S2 normal, no rub   or gallop, Murmur - no  Breast Exam:    NOT DONE  Abdomen:     Soft, non-tender, bowel sounds active all four quadrants,    no masses, no organomegaly. Visceral obesity - no  Genitalia:   NOT DONE  Rectal:   NOT DONE  Extremities:   Extremities - normal, Has Cane - no, Clubbing - yes, Edema - no  Pulses:   2+ and symmetric all extremities  Skin:   Stigmata of Connective Tissue Disease - YES of RA  Lymph nodes:   Cervical, supraclavicular, and axillary nodes normal  Psychiatric:  Neurologic:   Pleasant - yes, Anxious - no, Flat affect - no  CAm-ICU - neg, Alert and Oriented x 3 - yes, Moves all 4s - yes, Speech - normal, Cognition - intact  Assessment:       ICD-10-CM   1. 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 Chronic cough  R05   3.  Asthma, unspecified asthma severity, unspecified whether complicated, unspecified whether persistent  J45.909   4. Polypharmacy  Z79.899   5. 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 -  -This is despite being on Medrol and Rituxan  -What ever reason you are having the flareups, it is contributing to the progression of fibrosis in the presence of fibrosis is making it easier to get admitted while having a flareup  PLAn -Please take consent for ILD-pro registry study -your participation in this will help contribute to what signs - meet Verline Lema 05/24/2019   -Recommend starting nintedanib (ofev) and antifibrotic that is shown to be beneficial in the case of 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  There is no evidence of any organic antigen exposure to suggest hypersensitivity pneumonitis  PLAN --I also encourage you to keep your appointment with GI to look at acid reflux related issues that could be contributing to flareups -Okay to stop Singulair -Continue Dulera and albuterol  Chronic cough -This is due to multiple reasons especially the pulmonary fibrosis and the obstructive lung disease -Losartan might be contributing;    PLAN Please talk to primary care physician and ask for an alternative from another class such as a calcium channel blocker   Polypharmacy Post herpetic neuralgia  Plan -Stop Singulair -As primary care physician to consolidate your pain management to one medication [currently you are on 3 which is namely Elavil, venlafaxine and gabapentin]    Follow-up  -4-6 weeks or sooner if needed; 30-minute slot   > 50% of this > 40 min visit spent in face to face counseling or/and coordination of care - by this undersigned MD - Dr Brand Males. This includes one or more of the following documented above: discussion of test results, diagnostic or treatment recommendations, prognosis, risks and benefits of management options, instructions, education, compliance or risk-factor reduction    SIGNATURE    Dr. Brand Males, M.D., F.C.C.P,  Pulmonary and Critical Care Medicine Staff Physician, [place] Director - Interstitial Lung Disease  Program  [facility] [place], [place], [address]  Pager: [phone], If no answer or between  15:00h - 7:00h: call [phone] Telephone: [phone]  1:58 PM 05/24/2019

## 2019-05-24 NOTE — Telephone Encounter (Signed)
I am in office doing procedures, I will follow up. Thanks for letting me know.  Anderson Malta, can you please discuss the case with me before patient checks out? Thanks

## 2019-05-24 NOTE — Telephone Encounter (Signed)
Hi Stacey [name]  [employer] is seeing your PA soon. She has ILD due to RA but she has hiatal hernia. She is on PPI. She is having bad cough that results in deterioration and admission to hospital with wheeze and hypoxemia. Somewhat unisual for someone with ILD. We are wondering about GERD/aspiration? Could you please give extra oversight here. ? Need pH probe study  Thanks    SIGNATURE    Dr. Brand Males, M.D., F.C.C.P,  Pulmonary and Critical Care Medicine Staff Physician, [place] Director - Interstitial Lung Disease  Program  [facility] [place], [place], [address]  Pager: [phone], If no answer or between  15:00h - 7:00h: call [phone] Telephone: [phone]  2:03 PM 05/24/2019

## 2019-05-24 NOTE — Telephone Encounter (Signed)
Hi Stacey [name]  The CT chest showed 1 vessel coronary artery calcification. Can you please ask Stacey [name] if she ever had a cardiac stress test?  Thanks    SIGNATURE    Dr. Brand Males, M.D., F.C.C.P,  Pulmonary and Critical Care Medicine Staff Physician, [place] Director - Interstitial Lung Disease  Program  P[facility] at [place], [place], [address]  Pager: [phone], If no answer or between  15:00h - 7:00h: call [phone] Telephone: [phone]  2:00 PM 05/24/2019

## 2019-05-24 NOTE — Patient Instructions (Addendum)
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 -  -This is despite being on Medrol and Rituxan  -What ever reason you are having the flareups, it is contributing to the progression of fibrosis in the presence of fibrosis is making it easier to get admitted while having a flareup  PLAn -Please take consent for ILD-pro registry study -your participation in this will help contribute to what signs - meet Stacey [name] 05/24/2019   -Recommend starting nintedanib (ofev) and antifibrotic that is shown to be beneficial in the case of 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  There is no evidence of any organic antigen exposure to suggest hypersensitivity pneumonitis  PLAN --I also encourage you to keep your appointment with GI to look at acid reflux related issues that could be contributing to flareups -Okay to stop Singulair -Continue Dulera and albuterol  Chronic cough -This is due to multiple reasons especially the pulmonary fibrosis and the obstructive lung disease -Losartan might be contributing;   PLAN Please talk to primary care physician and ask for an alternative from another class such as a calcium channel blocker   Polypharmacy Post herpetic neuralgia  Plan -Stop Singulair -As primary care physician to  consolidate your pain management to one medication [currently you are on 3 which is namely Elavil, venlafaxine and gabapentin]    Follow-up  -4-6 weeks or sooner if needed; 30-minute slot

## 2019-05-25 NOTE — Telephone Encounter (Signed)
Yes M'aam. Thanks for the heads up. JLL

## 2019-05-26 NOTE — Telephone Encounter (Signed)
Called and spoke with pt asking her if she had ever had a cardiac stress test as MR said coronary artery calcification was seen on the  CT. Pt said that she does not believe she has ever had one done.  MR, please advise if you want this ordered. Thanks!

## 2019-05-29 ENCOUNTER — Telehealth: Payer: Self-pay | Admitting: *Deleted

## 2019-05-29 NOTE — Telephone Encounter (Signed)
Referral placed for cardiology. Nothing further needed.

## 2019-05-29 NOTE — Telephone Encounter (Signed)
Ok please apoloigie that I did not discuss this at office visit but later as I was doing notes I picked up on fact there is 1 vessel coronary artery calcification. In absence of chest pain this calcium deposit is likely due to ageing but still a good idea to see cardiology   Please make a referral

## 2019-05-29 NOTE — Telephone Encounter (Signed)
A message was left, re: her follow up visit. 

## 2019-05-31 ENCOUNTER — Other Ambulatory Visit: Payer: Self-pay

## 2019-05-31 ENCOUNTER — Ambulatory Visit: Payer: Medicare Other | Admitting: Physician Assistant

## 2019-05-31 DIAGNOSIS — Z20828 Contact with and (suspected) exposure to other viral communicable diseases: Secondary | ICD-10-CM | POA: Diagnosis not present

## 2019-05-31 DIAGNOSIS — Z20822 Contact with and (suspected) exposure to covid-19: Secondary | ICD-10-CM

## 2019-06-01 ENCOUNTER — Telehealth: Payer: Self-pay | Admitting: Internal Medicine

## 2019-06-01 NOTE — Telephone Encounter (Signed)
Called and spoke with pt letting her know the info stated by MR. Pt verbalized understanding. Research appt cancelled and pt's appt with MR rescheduled for 12/9. Nothing further needed.

## 2019-06-01 NOTE — Telephone Encounter (Signed)
Call made to [facility], spoke with Anderson Malta, made aware the appt will have to be done in office.   Call made to patient, she states her husband tested positive for covid last Friday but did not get results until Tuesday 11/17. She denies all symptoms however she thinks she needs to r/s her appt. Requesting recommendations.   MR please advise. Thanks.

## 2019-06-01 NOTE — Telephone Encounter (Signed)
Has been diagnosed with COVID-19 on May 26, 2019 Friday.  Patient will be at risk for getting Covid till June 09, 2019.  Therefore she should cancel research visit scheduled for November 24.  Infection should also cancel the visit with me on June 12, 2019 but instead come sometime in the first week of December 2020  Patient needs to isolate herself from the husband.  Wear a mask at home.  No Thanksgiving clusters.  No Christmas clusters.

## 2019-06-02 LAB — NOVEL CORONAVIRUS, NAA: SARS-CoV-2, NAA: DETECTED — AB

## 2019-06-06 ENCOUNTER — Telehealth: Payer: Self-pay | Admitting: Internal Medicine

## 2019-06-06 NOTE — Telephone Encounter (Signed)
Medication name and strength: Ofev 150mg  Provider: MR Pharmacy: [facility] Patient insurance ID: [id number] Phone:  Fax:   Was the PA started on CMM?  Yes If yes, please enter the Key: [id number] Timeframe for approval/denial: 1-3 business days

## 2019-06-06 NOTE — Telephone Encounter (Signed)
Pt's upcoming visit has been made a televisit. Called and spoke with pt letting her know this had been done and pt verbalized understanding. Nothing further needed.

## 2019-06-06 NOTE — Telephone Encounter (Signed)
Found paperwork to for a PA for patient's Ofev. Will start PA today. Asked Aaron Edelman if patient will need a televisit since MR has left for the day, he stated yes within 1 to 2 weeks with either MR or an APP.   Patient already has an appt for 12/9. Left message for patient to call back so she is aware that this will need to be changed into a televisit.

## 2019-06-06 NOTE — Telephone Encounter (Signed)
I called pt to discuss, and she tested positive for Covid last Saturday. She feels ok now but just wanted to let you know. MR do you have any recommendations for pt testing positive for Covid. Should we schedule a televisit in a week? Please advise.

## 2019-06-06 NOTE — Telephone Encounter (Signed)
If the nintedanib has not been started yet then I recommend that the visit happen approximately 2-3 weeks after she starts the nintedanib.  Therefore please change the visit appropriately either to see myself or Brian-it can be a televisit

## 2019-06-11 ENCOUNTER — Inpatient Hospital Stay (HOSPITAL_COMMUNITY)
Admission: EM | Admit: 2019-06-11 | Discharge: 2019-06-16 | DRG: 177 | Disposition: A | Discharge status: Home / Self Care | Payer: Medicare Other | Attending: Internal Medicine | Admitting: Internal Medicine

## 2019-06-11 ENCOUNTER — Encounter (HOSPITAL_COMMUNITY): Payer: Self-pay | Admitting: Emergency Medicine

## 2019-06-11 ENCOUNTER — Emergency Department (HOSPITAL_COMMUNITY): Payer: Medicare Other

## 2019-06-11 ENCOUNTER — Other Ambulatory Visit: Payer: Self-pay

## 2019-06-11 DIAGNOSIS — Z9981 Dependence on supplemental oxygen: Secondary | ICD-10-CM | POA: Diagnosis not present

## 2019-06-11 DIAGNOSIS — M069 Rheumatoid arthritis, unspecified: Secondary | ICD-10-CM | POA: Diagnosis present

## 2019-06-11 DIAGNOSIS — J449 Chronic obstructive pulmonary disease, unspecified: Secondary | ICD-10-CM | POA: Diagnosis present

## 2019-06-11 DIAGNOSIS — R0602 Shortness of breath: Secondary | ICD-10-CM | POA: Diagnosis not present

## 2019-06-11 DIAGNOSIS — K219 Gastro-esophageal reflux disease without esophagitis: Secondary | ICD-10-CM | POA: Diagnosis present

## 2019-06-11 DIAGNOSIS — J4540 Moderate persistent asthma, uncomplicated: Secondary | ICD-10-CM | POA: Diagnosis not present

## 2019-06-11 DIAGNOSIS — I10 Essential (primary) hypertension: Secondary | ICD-10-CM | POA: Diagnosis present

## 2019-06-11 DIAGNOSIS — Z825 Family history of asthma and other chronic lower respiratory diseases: Secondary | ICD-10-CM | POA: Diagnosis not present

## 2019-06-11 DIAGNOSIS — Z888 Allergy status to other drugs, medicaments and biological substances status: Secondary | ICD-10-CM

## 2019-06-11 DIAGNOSIS — J1289 Other viral pneumonia: Secondary | ICD-10-CM | POA: Diagnosis present

## 2019-06-11 DIAGNOSIS — Z803 Family history of malignant neoplasm of breast: Secondary | ICD-10-CM | POA: Diagnosis not present

## 2019-06-11 DIAGNOSIS — M810 Age-related osteoporosis without current pathological fracture: Secondary | ICD-10-CM | POA: Diagnosis present

## 2019-06-11 DIAGNOSIS — Z880 Allergy status to penicillin: Secondary | ICD-10-CM

## 2019-06-11 DIAGNOSIS — J45909 Unspecified asthma, uncomplicated: Secondary | ICD-10-CM | POA: Diagnosis present

## 2019-06-11 DIAGNOSIS — J9621 Acute and chronic respiratory failure with hypoxia: Secondary | ICD-10-CM

## 2019-06-11 DIAGNOSIS — J9601 Acute respiratory failure with hypoxia: Secondary | ICD-10-CM | POA: Diagnosis not present

## 2019-06-11 DIAGNOSIS — Z7952 Long term (current) use of systemic steroids: Secondary | ICD-10-CM

## 2019-06-11 DIAGNOSIS — F329 Major depressive disorder, single episode, unspecified: Secondary | ICD-10-CM | POA: Diagnosis present

## 2019-06-11 DIAGNOSIS — J849 Interstitial pulmonary disease, unspecified: Secondary | ICD-10-CM | POA: Diagnosis not present

## 2019-06-11 DIAGNOSIS — Z882 Allergy status to sulfonamides status: Secondary | ICD-10-CM | POA: Diagnosis not present

## 2019-06-11 DIAGNOSIS — F419 Anxiety disorder, unspecified: Secondary | ICD-10-CM | POA: Diagnosis present

## 2019-06-11 DIAGNOSIS — Z79899 Other long term (current) drug therapy: Secondary | ICD-10-CM | POA: Diagnosis not present

## 2019-06-11 DIAGNOSIS — U071 COVID-19: Secondary | ICD-10-CM | POA: Diagnosis present

## 2019-06-11 DIAGNOSIS — R05 Cough: Secondary | ICD-10-CM | POA: Diagnosis not present

## 2019-06-11 DIAGNOSIS — R5381 Other malaise: Secondary | ICD-10-CM | POA: Diagnosis not present

## 2019-06-11 DIAGNOSIS — J1282 Pneumonia due to coronavirus disease 2019: Secondary | ICD-10-CM | POA: Diagnosis present

## 2019-06-11 LAB — CBC WITH DIFFERENTIAL/PLATELET
Abs Immature Granulocytes: 0.13 10*3/uL — ABNORMAL HIGH (ref 0.00–0.07)
Basophils Absolute: 0 10*3/uL (ref 0.0–0.1)
Basophils Relative: 1 %
Eosinophils Absolute: 0.5 10*3/uL (ref 0.0–0.5)
Eosinophils Relative: 6 %
HCT: 39.8 % (ref 36.0–46.0)
Hemoglobin: 12.5 g/dL (ref 12.0–15.0)
Immature Granulocytes: 2 %
Lymphocytes Relative: 26 %
Lymphs Abs: 2.3 10*3/uL (ref 0.7–4.0)
MCH: 28.7 pg (ref 26.0–34.0)
MCHC: 31.4 g/dL (ref 30.0–36.0)
MCV: 91.5 fL (ref 80.0–100.0)
Monocytes Absolute: 1.3 10*3/uL — ABNORMAL HIGH (ref 0.1–1.0)
Monocytes Relative: 15 %
Neutro Abs: 4.6 10*3/uL (ref 1.7–7.7)
Neutrophils Relative %: 50 %
Platelets: 337 10*3/uL (ref 150–400)
RBC: 4.35 MIL/uL (ref 3.87–5.11)
RDW: 13.7 % (ref 11.5–15.5)
WBC: 8.8 10*3/uL (ref 4.0–10.5)
nRBC: 0 % (ref 0.0–0.2)

## 2019-06-11 LAB — COMPREHENSIVE METABOLIC PANEL
ALT: 19 U/L (ref 0–44)
AST: 31 U/L (ref 15–41)
Albumin: 3.1 g/dL — ABNORMAL LOW (ref 3.5–5.0)
Alkaline Phosphatase: 58 U/L (ref 38–126)
Anion gap: 7 (ref 5–15)
BUN: 10 mg/dL (ref 8–23)
CO2: 29 mmol/L (ref 22–32)
Calcium: 8.3 mg/dL — ABNORMAL LOW (ref 8.9–10.3)
Chloride: 101 mmol/L (ref 98–111)
Creatinine, Ser: 0.77 mg/dL (ref 0.44–1.00)
GFR calc Af Amer: >60 mL/min (ref >60)
GFR calc non Af Amer: >60 mL/min (ref >60)
Glucose, Bld: 101 mg/dL — ABNORMAL HIGH (ref 70–99)
Potassium: 5.2 mmol/L — ABNORMAL HIGH (ref 3.5–5.1)
Sodium: 137 mmol/L (ref 135–145)
Total Bilirubin: 1.3 mg/dL — ABNORMAL HIGH (ref 0.3–1.2)
Total Protein: 6.1 g/dL — ABNORMAL LOW (ref 6.5–8.1)

## 2019-06-11 LAB — LACTIC ACID, PLASMA: Lactic Acid, Venous: 0.8 mmol/L (ref 0.5–1.9)

## 2019-06-11 LAB — TROPONIN I (HIGH SENSITIVITY): Troponin I (High Sensitivity): 5 ng/L (ref <18)

## 2019-06-11 MED ORDER — ALBUTEROL SULFATE HFA 108 (90 BASE) MCG/ACT IN AERS
2.0000 | INHALATION_SPRAY | Freq: Once | RESPIRATORY_TRACT | Status: AC
  Administered 2019-06-11: 2 via RESPIRATORY_TRACT
  Filled 2019-06-11: qty 6.7

## 2019-06-11 MED ORDER — IPRATROPIUM BROMIDE HFA 17 MCG/ACT IN AERS
2.0000 | INHALATION_SPRAY | Freq: Once | RESPIRATORY_TRACT | Status: AC
  Administered 2019-06-11: 2 via RESPIRATORY_TRACT
  Filled 2019-06-11: qty 12.9

## 2019-06-11 NOTE — ED Notes (Signed)
Pulse ox 86 when ambulating without oxygen

## 2019-06-11 NOTE — H&P (Signed)
Stacey [name] [id number] DOB: 06-Mar-1941 DOA: 06/11/2019     PCP: Shon Baton, MD   [facility]    Pulmonary  Dr. Lynford Citizen    Patient arrived to ER on 06/11/19 at 2037  Patient coming from: home Lives  With family   Chief Complaint:   Chief Complaint  Patient presents with  . Shortness of Breath    HPI: [name] is a 78 y.o. female with medical history significant of COVID 19 infection, interstitial lung disease, usually on oxygen at night only 2 L.,  Hypertension, depression, GERD, rheumatoid arthritis     Presented with   worsening shortness of breath Patient tested positive on 18 November for Covid Initially had fairly mild symptoms But eventually progressed to malaise fatigue body aches significant shortness of breath Patient had home oxygen and started herself on 2 L for the past 2 days  Patient is never smoker but she has been on prednisone for years.  Has known interstitial lung disease. Been on immune suppressive medications for years. Patient diagnosed rheumatoid arthritis.   Infectious risk factors:  Reports fever, shortness of breath, dry cough,    KNOWN COVID POSITIVE     Lab Results  Component Value Date   SARSCOV2NAA Detected (A) 05/31/2019   [place] NEGATIVE 05/06/2019   [place] NEGATIVE 03/24/2019   [place] NEGATIVE 12/17/2018     Regarding pertinent Chronic problems:    Hyperlipidemia -  on statins Pravachol   HTN   - on diltiazem  History of rheumatoid arthritis on Rituxan in the past stopped for the past 6 months   Asthma -well   controlled on home inhalers/ nebs                      Interstitial lung disease-  followed by pulmonology on baseline oxygen   2 L, only when sleeping       While in ER:  Noted to desat down to 86% when ambulating without oxygen    The following Work up has been ordered so far:  Orders Placed This Encounter  Procedures  . Critical Care  . Blood Culture  (routine x 2)  . DG Chest Portable 1 View  . CBC with Differential  . Comprehensive metabolic panel  . D-dimer, quantitative  . Procalcitonin  . Lactate dehydrogenase  . Ferritin  . Triglycerides  . Fibrinogen  . C-reactive protein  . Diet NPO time specified  . Check Pulse Oximetry while ambulating  . Cardiac monitoring  . Insert peripheral IV x 2  . Initiate Carrier Fluid Protocol  . Place surgical mask on patient  . Patient to wear surgical mask during transportation  . Assess patient for ability to self-prone. If able (can move self in bed, ambulate) and stable (SpO2 and oxygen requirement):  . RN/NT - Document specific oxygen requirements in CHL  . Notify EDP if new oxygen requirements escalates > 4L per minute [place]  . RN to draw the following extra tubes:  . Consult to hospitalist  ALL PATIENTS BEING ADMITTED/HAVING PROCEDURES NEED COVID-19 SCREENING  . Airborne and Contact precautions  . Pulse oximetry, continuous  . ED EKG  . EKG 12-Lead  . Saline lock IV   Following Medications were ordered in ER: Medications  albuterol (VENTOLIN HFA) 108 (90 Base) MCG/ACT inhaler 2 puff (2 puffs Inhalation Given 06/11/19 2152)  ipratropium (ATROVENT HFA) inhaler 2 puff (2 puffs Inhalation Given 06/11/19 2152)  Consult Orders  (From admission, onward)         Start     Ordered   06/11/19 2306  Consult to hospitalist  ALL PATIENTS BEING ADMITTED/HAVING PROCEDURES NEED COVID-19 SCREENING  Once    Comments: ALL PATIENTS BEING ADMITTED/HAVING PROCEDURES NEED COVID-19 SCREENING  Provider:  (Not yet assigned)  Question Answer Comment  Place call to: [facility]   Reason for Consult Admit      06/11/19 2305          Significant initial  Findings: Abnormal Labs Reviewed  CBC WITH DIFFERENTIAL/PLATELET - Abnormal; Notable for the following components:      Result Value   Monocytes Absolute 1.3 (*)    Abs Immature Granulocytes 0.13 (*)    All other components within  normal limits  COMPREHENSIVE METABOLIC PANEL - Abnormal; Notable for the following components:   Potassium 5.2 (*)    Glucose, Bld 101 (*)    Calcium 8.3 (*)    Total Protein 6.1 (*)    Albumin 3.1 (*)    Total Bilirubin 1.3 (*)    All other components within normal limits   Otherwise labs showing:    Recent Labs  Lab 06/11/19 2154  NA 137  K 5.2*  CO2 29  GLUCOSE 101*  BUN 10  CREATININE 0.77  CALCIUM 8.3*    Cr  stable,    Lab Results  Component Value Date   CREATININE 0.77 06/11/2019   CREATININE 0.64 05/06/2019   CREATININE 0.65 12/20/2018    Recent Labs  Lab 06/11/19 2154  AST 31  ALT 19  ALKPHOS 58  BILITOT 1.3*  PROT 6.1*  ALBUMIN 3.1*   Lab Results  Component Value Date   CALCIUM 8.3 (L) 06/11/2019   PHOS 3.0 03/29/2018     WBC      Component Value Date/Time   WBC 8.8 06/11/2019 2154   ANC    Component Value Date/Time   NEUTROABS 4.6 06/11/2019 2154   ALC No components found for: LYMPHAB    Plt: Lab Results  Component Value Date   PLT 337 06/11/2019     Lactic Acid, Venous    Component Value Date/Time   LATICACIDVEN 0.8 06/11/2019 2154    Procalcitonin <0.1   COVID-19 Labs  No results for input(s): DDIMER, FERRITIN, LDH, CRP in the last 72 hours.  Lab Results  Component Value Date   SARSCOV2NAA Detected (A) 05/31/2019   [place] NEGATIVE 05/06/2019   [place] NEGATIVE 03/24/2019   [place] NEGATIVE 12/17/2018    HG/HCT  stable,       Component Value Date/Time   HGB 12.5 06/11/2019 2154   HGB LA11 03/26/2018 0507   HCT 39.8 06/11/2019 2154     Troponin ordered       ECG: Ordered Personally reviewed by me showing: HR : 84 Rhythm:  NSR,   nonspecific changes   QTC 412    UA  not ordered       CXR - Chronic pulmonary interstitial disease. lung bases left greater than right, suspicious for COVID-19 pneumonia      ED Triage Vitals  Enc Vitals Group     BP 06/11/19 2054 140/70     Pulse Rate  06/11/19 2048 85     Resp 06/11/19 2054 (!) 28     Temp 06/11/19 2048 98.8 F (37.1 C)     Temp Source 06/11/19 2048 Oral     SpO2 06/11/19 2047 96 %  Weight 06/11/19 2053 140 lb (63.5 kg)     Height 06/11/19 2053 5\' 6"  (1.676 m)     Head Circumference --      Peak Flow --      Pain Score 06/11/19 2051 0     Pain Loc --      Pain Edu? --      Excl. in [place]? --   TMAX(24)@       Latest  Blood pressure 130/73, pulse 77, temperature 98.8 F (37.1 C), temperature source Oral, resp. rate (!) 24, height 5\' 6"  (1.676 m), weight 63.5 kg, SpO2 100 %.     Hospitalist was called for admission for COVID PNA   Review of Systems:    Pertinent positives include:   Fevers, chills,shortness of breath at rest. No dyspnea on exertion , fatigue,  Constitutional:  No weight loss, night sweatsweight loss  HEENT:  No headaches, Difficulty swallowing,Tooth/dental problems,Sore throat,  No sneezing, itching, ear ache, nasal congestion, post nasal drip,  Cardio-vascular:  No chest pain, Orthopnea, PND, anasarca, dizziness, palpitations.no Bilateral lower extremity swelling  GI:  No heartburn, indigestion, abdominal pain, nausea, vomiting, diarrhea, change in bowel habits, loss of appetite, melena, blood in stool, hematemesis Resp:  no  No excess mucus, no productive cough, No non-productive cough, No coughing up of blood.No change in color of mucus.No wheezing. Skin:  no rash or lesions. No jaundice GU:  no dysuria, change in color of urine, no urgency or frequency. No straining to urinate.  No flank pain.  Musculoskeletal:  No joint pain or no joint swelling. No decreased range of motion. No back pain.  Psych:  No change in mood or affect. No depression or anxiety. No memory loss.  Neuro: no localizing neurological complaints, no tingling, no weakness, no double vision, no gait abnormality, no slurred speech, no confusion  All systems reviewed and apart from [place] all are negative  Past  Medical History:   Past Medical History:  Diagnosis Date  . Abnormal finding of blood chemistry   . Asthma   . H/O measles   . H/O varicella   . Hypertension   . 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[place])   . 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 Lichen sclerosus [id number]   Asymptomatic  . Low iron   . Mitral valve prolapse   . Osteoarthritis   . Osteoporosis   . Pneumonia   . Post herpetic neuralgia   . Rheumatoid arthritis(714.0)   . Yeast infection      Past Surgical History:  Procedure Laterality Date  . CHOLECYSTECTOMY  2011  . WISDOM TOOTH EXTRACTION      Social History:  Ambulatory  independently      reports that she has never smoked. She has never used smokeless tobacco. She reports that she does not drink alcohol or use drugs.    Family History:   Family History  Problem Relation Age of Onset  . Asthma Mother   . Anemia Mother   . Polymyalgia rheumatica Mother   . COPD Father   . Pulmonary fibrosis Father   . Breast cancer Maternal Grandmother 88    Allergies: Allergies  Allergen Reactions  . Penicillins Other (See Comments)    Reaction unknown occurred during childhood Has patient had a PCN reaction causing immediate rash, facial/tongue/throat swelling, SOB or lightheadedness with hypotension: Unknown Has patient had a PCN reaction causing severe rash involving mucus membranes or skin necrosis: Unknown Has patient had a PCN reaction that required  hospitalization: No Has patient had a PCN reaction occurring within the last 10 years: No If all of the above answers are "NO", then may proceed with Cephalosporin use.  Unsur  . Remicade [Infliximab] Other (See Comments)    Reaction unknown  . Sulfa Antibiotics Other (See Comments)    Reaction unknown  . Sulfamethoxazole Other (See Comments)    Unsure of reaction As a child not sure      Prior to Admission medications   Medication Sig Start Date End Date Taking? Authorizing Provider  Nintedanib (OFEV) 150  MG CAPS Take 150 mg by mouth 2 (two) times daily.   Yes [provider]  albuterol (VENTOLIN HFA) 108 (90 Base) MCG/ACT inhaler Inhale 2 puffs into the lungs every 6 (six) hours as needed for wheezing or shortness of breath. 01/02/19   Fenton Foy, NP  amitriptyline (ELAVIL) 25 MG tablet Take 25 mg by mouth at bedtime. 02/04/15   [provider]  benzonatate (TESSALON) 200 MG capsule Take 1 capsule (200 mg total) by mouth 3 (three) times daily as needed for cough. 05/09/19   Donita Brooks, NP  beta carotene w/minerals (OCUVITE) tablet Take 1 tablet by mouth daily.    [provider]  chlorpheniramine-HYDROcodone (TUSSIONEX) 10-8 MG/5ML SUER Take 5 mLs by mouth every 12 (twelve) hours as needed for cough (Not releaved by Delsym  or Robitussin.). 05/09/19   Donita Brooks, NP  cholecalciferol (VITAMIN D) 1000 UNITS tablet Take 1,000 Units by mouth daily.    [provider]  dextromethorphan (DELSYM) 30 MG/5ML liquid Take 2.5 mLs (15 mg total) by mouth at bedtime. 01/27/18   Lavina Hamman, MD  gabapentin (NEURONTIN) 300 MG capsule Take 600 mg by mouth at bedtime.    [provider]  ipratropium-albuterol (DUONEB) 0.5-2.5 (3) MG/3ML SOLN Take 3 mLs by nebulization 3 (three) times daily. 05/10/19   Amin, Jeanella Flattery, MD  losartan (COZAAR) 25 MG tablet Take 25 mg by mouth daily. 02/04/15   [provider]  mometasone-formoterol (DULERA) 100-5 MCG/ACT AERO Inhale 2 puffs into the lungs 2 (two) times daily. 01/02/19   Fenton Foy, NP  pantoprazole (PROTONIX) 40 MG tablet Take 1 tablet (40 mg total) by mouth 2 (two) times daily before a meal. 05/09/19   Donita Brooks, NP  pravastatin (PRAVACHOL) 20 MG tablet Take 1 tablet (20 mg total) by mouth every evening. 01/06/19 01/01/20  Buford Dresser, MD  riTUXimab (RITUXAN) 100 MG/10ML injection Inject into the vein. Receives at [facility] but unsure of dose - no more doses for next 6 months as of  09/14/18    [provider]  venlafaxine XR (EFFEXOR-XR) 75 MG 24 hr capsule Take 75 mg by mouth at bedtime.     [provider]   Physical Exam: Blood pressure 130/73, pulse 77, temperature 98.8 F (37.1 C), temperature source Oral, resp. rate (!) 24, height 5\' 6"  (1.676 m), weight 63.5 kg, SpO2 100 %. 1. General:  in No  Acute distress   Chronically ill  -appearing 2. Psychological: Alert and    Oriented 3. Head/ENT:    Dry Mucous Membranes                          Head Non traumatic, neck supple                            Poor  Dentition 4. SKIN:   decreased Skin turgor,  Skin clean Dry and intact no rash 5. Heart: Regular rate and rhythm no  Murmur, no Rub or gallop 6. Lungs:   no wheezes fine crackles   7. Abdomen: Soft,   non-tender, Non distended bowel sounds present 8. Lower extremities: no clubbing, cyanosis, no  edema 9. Neurologically Grossly intact, moving all 4 extremities equally   10. MSK: Normal range of motion   All other LABS:     Recent Labs  Lab 06/11/19 2154  WBC 8.8  NEUTROABS 4.6  HGB 12.5  HCT 39.8  MCV 91.5  PLT 337     Recent Labs  Lab 06/11/19 2154  NA 137  K 5.2*  CL 101  CO2 29  GLUCOSE 101*  BUN 10  CREATININE 0.77  CALCIUM 8.3*     Recent Labs  Lab 06/11/19 2154  AST 31  ALT 19  ALKPHOS 58  BILITOT 1.3*  PROT 6.1*  ALBUMIN 3.1*       Cultures: No results found for: SDES, SPECREQUEST, CULT, REPTSTATUS   Radiological Exams on Admission: Dg Chest Portable 1 View  Result Date: 06/11/2019 CLINICAL DATA:  78 year old female with cough and shortness of breath. Positive for COVID-19 1 week ago. EXAM: PORTABLE CHEST 1 VIEW COMPARISON:  Chest radiographs 05/06/2019 and earlier. FINDINGS: Portable AP upright view at 2127 hours. Chronic interstitial lung disease. Stable lung volumes. Stable cardiac size and mediastinal contours. Calcified aortic atherosclerosis. Visualized tracheal air column is within normal limits.  Compared to a portable chest on 12/17/2018, there is increased patchy opacity at the left lung base, and minimal nodular opacity appears new at the right lung base. Elsewhere lung markings appear stable. No pleural effusion is evident. Stable cholecystectomy clips. Negative visible bowel gas pattern. No acute osseous abnormality identified. IMPRESSION: Chronic pulmonary interstitial disease. Evidence of new patchy opacity at the lung bases left greater than right, suspicious for COVID-19 pneumonia in this clinical setting. No pleural effusion. Electronically Signed   By: Genevie Ann M.D.   On: 06/11/2019 21:50    Chart has been reviewed    Assessment/Plan  78 y.o. female with medical history significant of COVID 19 infection, interstitial lung disease, usually on oxygen at night only 2 L.,  Hypertension, depression, GERD, rheumatoid arthritis Admitted for COVID-19 PNA    Present on Admission: . Pneumonia due to COVID-19 virus -   FROM HOME  WITH KNOWN HX OF COVID19      - As per hx pt with evidence of  Cough  shortness of breath   -  With known exposure to sick contacts her Husband - new  hypoxia  Following concerning LAB/ imaging findings:     CRP, LDH: increased    Procalcitonin: low   CXR: hazy bilateral peripheral opacities       -Following work-up initiated:         sputum cultures Ordered 06/11/19, Blood cultures Ordered 06/11/19,     Plan of treatment: - [facility] to [facility] facility if positive and beds are available    -given Hypoxia and /or infiltrates initiate steroids Decadron 6mg  q 24 hours And pharmacy consult for remdesivir - Will follow daily d.dimer - Assess for ability to prone  - Supportive management -Fluid sparing resuscitation  -Provide oxygen as needed currently on   SpO2: 100 % O2 Flow Rate (L/min): 2 L/min - IF d.dimer elvated >5 will increase dose of lovenox   Poor Prognostic factors  78 y.o.  Personal hx of  HTN, obesity immunocompromised  state   Will order Airborne and Contact precautions       . Rheumatoid arthritis ([place]) chronic currently stable . Asthma -continue home medications Avoid nebulizers  . ILD (interstitial lung disease) ([place]) chronic stable continue oxygen as needed  . Essential hypertension restart diltiazem . Acute respiratory failure with hypoxia (HCC) -mild worse with ambulation patient with advanced age risk factors for decompensation will admit to observe   Other plan as per orders.  DVT prophylaxis:  Lovenox     Code Status:  FULL CODE   as per patient   I had personally discussed CODE STATUS with patient   Family Communication:   Family not at  Bedside    Disposition Plan:     To home once workup is complete and patient is stable                       Consults called: None  Admission status:  ED Disposition    ED Disposition Condition Comment   Admit  The patient appears reasonably stabilized for admission considering the current resources, flow, and capabilities available in the ED at this time, and I doubt any other [facility] requiring further screening and/or treatment in the ED prior to admission is  present.          inpatient     Expect 2 midnight stay secondary to severity of patient's current illness including   *hemodynamic instability despite optimal treatment (   Hypoxia, )   Severe lab/radiological/exam abnormalities including:   Covid positive for Covid pneumonia and extensive comorbidities including:     COPD/asthma . Chronic lung disease  That are currently affecting medical management.   I expect  patient to be hospitalized for 2 midnights requiring inpatient medical care.  Patient is at high risk for adverse outcome (such as loss of life or disability) if not treated.  Indication for inpatient stay as follows:    New or worsening hypoxia  Need for   IV fluids IV anti virals      Level of care        SDU tele indefinitely please discontinue once patient  no longer qualifies   Precautions: admitted as  covid positive Airborne and Contact precautions    PPE: Used by the provider:   P100  eye Goggles,  Gloves  gown    Careena Degraffenreid 06/11/2019, 12:51 AM   [facility]     after 2 AM please page floor coverage PA If 7AM-7PM, please contact the day team taking care of the patient using [email or web address]

## 2019-06-11 NOTE — ED Triage Notes (Signed)
EMS arrived via EMS. EMS states pt has malaise, fatigue, body aches, SOB. Pt tested positive for covid a week ago. Pt has hx of interstitial lung disease. Pt has been on 2L oxygen for 2 days.

## 2019-06-11 NOTE — ED Provider Notes (Signed)
[facility] DEPT Provider Note   CSN: [id number] Arrival date & time: 06/11/19  2037     History   Chief Complaint Chief Complaint  Patient presents with  . Shortness of Breath    HPI Stacey [name] is a 78 y.o. female.  Past medical history interstitial lung disease.  Presents ER with shortness of breath.  Known COVID-19.  States initially just having generalized malaise, fatigue.  Now today noting more short of breath.  No chest pain.  No syncope.  Symptoms worse with exertion.  No fever.  States she will wear oxygen sometimes at night, does not wear it during the day normally.  Has been wearing oxygen at home lately.     HPI  Past Medical History:  Diagnosis Date  . Abnormal finding of blood chemistry   . Asthma   . H/O measles   . H/O varicella   . Hypertension   . 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[place])   . 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 Lichen sclerosus [id number]   Asymptomatic  . Low iron   . Mitral valve prolapse   . Osteoarthritis   . Osteoporosis   . Pneumonia   . Post herpetic neuralgia   . Rheumatoid arthritis(714.0)   . Yeast infection     Patient Active Problem List   Diagnosis Date Noted  . 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[place]) 05/07/2019  . Acute asthma exacerbation 12/18/2018  . Elevated troponin 12/18/2018  . Acute bronchitis 08/30/2018  . Chronic respiratory failure with hypoxia ([place]) 04/06/2018  . Acute respiratory failure with hypoxia ([place]) 03/26/2018  . Essential hypertension 03/24/2018  . Depression 03/24/2018  . Asthma exacerbation 03/23/2018  . DOE (dyspnea on exertion) 03/21/2018  . 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 esophagitis 03/21/2018  . Chronic lung disease   . Hypoxia   . Dyspnea 01/23/2018  . ILD (interstitial lung disease) ([place]) 01/23/2018  . 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 Multiple pulmonary nodules/RA lung dz  05/29/2013  . Cough variant asthma vs uacs  05/29/2013  .  Rheumatoid arthritis ([place]) 01/13/2012  . Osteopenia 01/13/2012  . Lichen sclerosus et atrophicus of the vulva 01/13/2012  . Asthma 01/13/2012    Past Surgical History:  Procedure Laterality Date  . CHOLECYSTECTOMY  2011  . WISDOM TOOTH EXTRACTION       OB History    Gravida  2   Para  2   Term  2   Preterm      AB      Living  2     SAB      TAB      Ectopic      Multiple      Live Births  2            Home Medications    Prior to Admission medications   Medication Sig Start Date End Date Taking? Authorizing Provider  Nintedanib (OFEV) 150 MG CAPS Take 150 mg by mouth 2 (two) times daily.   Yes [provider]  albuterol (VENTOLIN HFA) 108 (90 Base) MCG/ACT inhaler Inhale 2 puffs into the lungs every 6 (six) hours as needed for wheezing or shortness of breath. 01/02/19   Fenton Foy, NP  amitriptyline (ELAVIL) 25 MG tablet Take 25 mg by mouth at bedtime. 02/04/15   [provider]  benzonatate (TESSALON) 200 MG capsule Take 1 capsule (200 mg total) by mouth 3 (three) times daily as needed for cough. 05/09/19  Donita Brooks, NP  beta carotene w/minerals (OCUVITE) tablet Take 1 tablet by mouth daily.    [provider]  chlorpheniramine-HYDROcodone (TUSSIONEX) 10-8 MG/5ML SUER Take 5 mLs by mouth every 12 (twelve) hours as needed for cough (Not releaved by Delsym  or Robitussin.). 05/09/19   Donita Brooks, NP  cholecalciferol (VITAMIN D) 1000 UNITS tablet Take 1,000 Units by mouth daily.    [provider]  dextromethorphan (DELSYM) 30 MG/5ML liquid Take 2.5 mLs (15 mg total) by mouth at bedtime. 01/27/18   Lavina Hamman, MD  gabapentin (NEURONTIN) 300 MG capsule Take 600 mg by mouth at bedtime.    [provider]  ipratropium-albuterol (DUONEB) 0.5-2.5 (3) MG/3ML SOLN Take 3 mLs by nebulization 3 (three) times daily. 05/10/19   Amin, Jeanella Flattery, MD  losartan (COZAAR) 25 MG tablet Take 25 mg by mouth daily.  02/04/15   [provider]  mometasone-formoterol (DULERA) 100-5 MCG/ACT AERO Inhale 2 puffs into the lungs 2 (two) times daily. 01/02/19   Fenton Foy, NP  pantoprazole (PROTONIX) 40 MG tablet Take 1 tablet (40 mg total) by mouth 2 (two) times daily before a meal. 05/09/19   Donita Brooks, NP  pravastatin (PRAVACHOL) 20 MG tablet Take 1 tablet (20 mg total) by mouth every evening. 01/06/19 01/01/20  Buford Dresser, MD  riTUXimab (RITUXAN) 100 MG/10ML injection Inject into the vein. Receives at [facility] but unsure of dose - no more doses for next 6 months as of 09/14/18    [provider]  venlafaxine XR (EFFEXOR-XR) 75 MG 24 hr capsule Take 75 mg by mouth at bedtime.     [provider]    Family History Family History  Problem Relation Age of Onset  . Asthma Mother   . Anemia Mother   . Polymyalgia rheumatica Mother   . COPD Father   . Pulmonary fibrosis Father   . Breast cancer Maternal Grandmother 88    Social History Social History   Tobacco Use  . Smoking status: Never Smoker  . Smokeless tobacco: Never Used  Substance Use Topics  . Alcohol use: No  . Drug use: No     Allergies   Penicillins, Remicade [infliximab], Sulfa antibiotics, and Sulfamethoxazole   Review of Systems Review of Systems  Constitutional: Positive for chills and fatigue. Negative for fever.  HENT: Negative for ear pain and sore throat.   Eyes: Negative for pain and visual disturbance.  Respiratory: Positive for cough and shortness of breath.   Cardiovascular: Negative for chest pain and palpitations.  Gastrointestinal: Negative for abdominal pain and vomiting.  Genitourinary: Negative for dysuria and hematuria.  Musculoskeletal: Negative for arthralgias and back pain.  Skin: Negative for color change and rash.  Neurological: Negative for seizures and syncope.  All other systems reviewed and are negative.    Physical Exam Updated Vital Signs BP 140/70    Pulse 85   Temp 98.8 F (37.1 C) (Oral)   Resp (!) 28   Ht 5\' 6"  (1.676 m)   Wt 63.5 kg   SpO2 99%   BMI 22.60 kg/m   Physical Exam Vitals signs and nursing note reviewed.  Constitutional:      General: She is not in acute distress.    Appearance: She is well-developed.  HENT:     Head: Normocephalic and atraumatic.  Eyes:     Conjunctiva/sclera: Conjunctivae normal.  Neck:     Musculoskeletal: Neck supple.  Cardiovascular:  Rate and Rhythm: Normal rate and regular rhythm.     Heart sounds: No murmur.  Pulmonary:     Effort: Pulmonary effort is normal. No tachypnea or respiratory distress.     Comments: Bilateral expiratory wheezing Abdominal:     Palpations: Abdomen is soft.     Tenderness: There is no abdominal tenderness.  Musculoskeletal:     Right lower leg: No edema.     Left lower leg: No edema.  Skin:    General: Skin is warm and dry.     Capillary Refill: Capillary refill takes less than 2 seconds.  Neurological:     General: No focal deficit present.     Mental Status: She is alert.  Psychiatric:        Mood and Affect: Mood normal.        Behavior: Behavior normal.      ED Treatments / Results  Labs (all labs ordered are listed, but only abnormal results are displayed) Labs Reviewed  CBC WITH DIFFERENTIAL/PLATELET - Abnormal; Notable for the following components:      Result Value   Monocytes Absolute 1.3 (*)    Abs Immature Granulocytes 0.13 (*)    All other components within normal limits  COMPREHENSIVE METABOLIC PANEL - Abnormal; Notable for the following components:   Potassium 5.2 (*)    Glucose, Bld 101 (*)    Calcium 8.3 (*)    Total Protein 6.1 (*)    Albumin 3.1 (*)    Total Bilirubin 1.3 (*)    All other components within normal limits  CULTURE, BLOOD (ROUTINE X 2)  CULTURE, BLOOD (ROUTINE X 2)  LACTIC ACID, PLASMA  D-DIMER, QUANTITATIVE (NOT AT [facility])  PROCALCITONIN  LACTATE DEHYDROGENASE  FERRITIN  TRIGLYCERIDES   FIBRINOGEN  C-REACTIVE PROTEIN  TROPONIN I (HIGH SENSITIVITY)    EKG EKG Interpretation  Date/Time:  Sunday June 11 2019 20:57:35 EST Ventricular Rate:  84 PR Interval:    QRS Duration: 91 QT Interval:  348 QTC Calculation: 412 R Axis:   19 Text Interpretation: Sinus rhythm Ventricular premature complex Minimal ST elevation, inferior leads Confirmed by Madalyn Rob [phone]) on 06/11/2019 11:05:29 PM   Radiology Dg Chest Portable 1 View  Result Date: 06/11/2019 CLINICAL DATA:  78 year old female with cough and shortness of breath. Positive for COVID-19 1 week ago. EXAM: PORTABLE CHEST 1 VIEW COMPARISON:  Chest radiographs 05/06/2019 and earlier. FINDINGS: Portable AP upright view at 2127 hours. Chronic interstitial lung disease. Stable lung volumes. Stable cardiac size and mediastinal contours. Calcified aortic atherosclerosis. Visualized tracheal air column is within normal limits. Compared to a portable chest on 12/17/2018, there is increased patchy opacity at the left lung base, and minimal nodular opacity appears new at the right lung base. Elsewhere lung markings appear stable. No pleural effusion is evident. Stable cholecystectomy clips. Negative visible bowel gas pattern. No acute osseous abnormality identified. IMPRESSION: Chronic pulmonary interstitial disease. Evidence of new patchy opacity at the lung bases left greater than right, suspicious for COVID-19 pneumonia in this clinical setting. No pleural effusion. Electronically Signed   By: Genevie Ann M.D.   On: 06/11/2019 21:50    Procedures .Critical Care Performed by: Lucrezia Starch, MD Authorized by: Lucrezia Starch, MD   Critical care provider statement:    Critical care time (minutes):  45   Critical care was necessary to treat or prevent imminent or life-threatening deterioration of the following conditions:  Respiratory failure   Critical care was time spent  personally by me on the following activities:   Discussions with consultants, evaluation of patient's response to treatment, examination of patient, ordering and performing treatments and interventions, ordering and review of laboratory studies, ordering and review of radiographic studies, pulse oximetry, re-evaluation of patient's condition, obtaining history from patient or surrogate and review of old charts   (including critical care time)  Medications Ordered in ED Medications  albuterol (VENTOLIN HFA) 108 (90 Base) MCG/ACT inhaler 2 puff (2 puffs Inhalation Given 06/11/19 2152)  ipratropium (ATROVENT HFA) inhaler 2 puff (2 puffs Inhalation Given 06/11/19 2152)     Initial Impression / Assessment and Plan / ED Course  I have reviewed the triage vital signs and the nursing notes.  Pertinent labs & imaging results that were available during my care of the patient were reviewed by me and considered in my medical decision making (see chart for details).        78 year old lady with interstitial lung disease presented to ER with shortness of breath in setting of known COVID-19.  On exam patient not in respiratory distress but noted to have mild hypoxia, bilateral expiratory wheezing.  Chest x-ray with new patchy opacities suspicious for Covid pneumonia.  Patient did quite poorly on ambulation trial.  Given progression of her symptoms at this time, believe she would benefit from admission for further management in hospital.  Will consult hospitalist service for further management.  Will give dose of steroids.  Final Clinical Impressions(s) / ED Diagnoses   Final diagnoses:  Pneumonia due to COVID-19 virus  SOB (shortness of breath)  ILD (interstitial lung disease) ([place])  Acute on chronic respiratory failure with hypoxia [facility])    ED Discharge Orders    None       Lucrezia Starch, MD 06/11/19 2314

## 2019-06-12 ENCOUNTER — Other Ambulatory Visit: Payer: Self-pay

## 2019-06-12 ENCOUNTER — Ambulatory Visit: Payer: Medicare Other | Admitting: Internal Medicine

## 2019-06-12 DIAGNOSIS — J9601 Acute respiratory failure with hypoxia: Secondary | ICD-10-CM

## 2019-06-12 LAB — TRIGLYCERIDES: Triglycerides: 79 mg/dL (ref <150)

## 2019-06-12 LAB — PROCALCITONIN: Procalcitonin: <0.1 ng/mL

## 2019-06-12 LAB — FIBRINOGEN: Fibrinogen: 544 mg/dL — ABNORMAL HIGH (ref 210–475)

## 2019-06-12 LAB — D-DIMER, QUANTITATIVE: D-Dimer, Quant: 1.42 ug/mL-FEU — ABNORMAL HIGH (ref 0.00–0.50)

## 2019-06-12 LAB — LACTATE DEHYDROGENASE: LDH: 201 U/L — ABNORMAL HIGH (ref 98–192)

## 2019-06-12 LAB — C-REACTIVE PROTEIN: CRP: 4.7 mg/dL — ABNORMAL HIGH (ref <1.0)

## 2019-06-12 LAB — FERRITIN: Ferritin: 131 ng/mL (ref 11–307)

## 2019-06-12 MED ORDER — AMITRIPTYLINE HCL 25 MG PO TABS
25.0000 mg | ORAL_TABLET | Freq: Every day | ORAL | Status: DC
  Administered 2019-06-12 – 2019-06-14 (×3): 25 mg via ORAL
  Filled 2019-06-12 (×4): qty 1

## 2019-06-12 MED ORDER — PRAVASTATIN SODIUM 10 MG PO TABS
20.0000 mg | ORAL_TABLET | Freq: Every evening | ORAL | Status: DC
  Administered 2019-06-12 – 2019-06-15 (×4): 20 mg via ORAL
  Filled 2019-06-12 (×4): qty 2

## 2019-06-12 MED ORDER — GABAPENTIN 300 MG PO CAPS
600.0000 mg | ORAL_CAPSULE | Freq: Every day | ORAL | Status: DC
  Administered 2019-06-12 – 2019-06-15 (×4): 600 mg via ORAL
  Filled 2019-06-12 (×4): qty 2

## 2019-06-12 MED ORDER — MOMETASONE FURO-FORMOTEROL FUM 100-5 MCG/ACT IN AERO
2.0000 | INHALATION_SPRAY | Freq: Two times a day (BID) | RESPIRATORY_TRACT | Status: DC
  Administered 2019-06-13 – 2019-06-16 (×4): 2 via RESPIRATORY_TRACT
  Filled 2019-06-12 (×2): qty 8.8

## 2019-06-12 MED ORDER — ONDANSETRON HCL 4 MG PO TABS: 4.0000 mg | ORAL_TABLET | Freq: Four times a day (QID) | ORAL | Status: DC | PRN

## 2019-06-12 MED ORDER — VENLAFAXINE HCL ER 75 MG PO CP24
75.0000 mg | ORAL_CAPSULE | Freq: Every day | ORAL | Status: DC
  Administered 2019-06-12 – 2019-06-15 (×4): 75 mg via ORAL
  Filled 2019-06-12 (×5): qty 1

## 2019-06-12 MED ORDER — VITAMIN C 500 MG PO TABS
500.0000 mg | ORAL_TABLET | Freq: Every day | ORAL | Status: DC
  Administered 2019-06-12 – 2019-06-16 (×5): 500 mg via ORAL
  Filled 2019-06-12 (×5): qty 1

## 2019-06-12 MED ORDER — PANTOPRAZOLE SODIUM 40 MG PO TBEC
40.0000 mg | DELAYED_RELEASE_TABLET | Freq: Two times a day (BID) | ORAL | Status: DC
  Administered 2019-06-12 – 2019-06-16 (×9): 40 mg via ORAL
  Filled 2019-06-12 (×9): qty 1

## 2019-06-12 MED ORDER — ACETAMINOPHEN 325 MG PO TABS: 650.0000 mg | ORAL_TABLET | Freq: Four times a day (QID) | ORAL | Status: DC | PRN

## 2019-06-12 MED ORDER — DILTIAZEM HCL ER COATED BEADS 180 MG PO CP24
180.0000 mg | ORAL_CAPSULE | Freq: Every day | ORAL | Status: DC
  Administered 2019-06-12 – 2019-06-16 (×5): 180 mg via ORAL
  Filled 2019-06-12 (×5): qty 1

## 2019-06-12 MED ORDER — SODIUM CHLORIDE 0.9 % IV SOLN
200.0000 mg | Freq: Once | INTRAVENOUS | Status: AC
  Administered 2019-06-12: 200 mg via INTRAVENOUS
  Filled 2019-06-12: qty 40

## 2019-06-12 MED ORDER — DEXAMETHASONE SODIUM PHOSPHATE 10 MG/ML IJ SOLN
6.0000 mg | INTRAMUSCULAR | Status: DC
  Administered 2019-06-12 – 2019-06-13 (×2): 6 mg via INTRAVENOUS
  Filled 2019-06-12 (×3): qty 1

## 2019-06-12 MED ORDER — SODIUM CHLORIDE 0.9 % IV SOLN
INTRAVENOUS | Status: DC
  Administered 2019-06-12: 04:00:00 via INTRAVENOUS

## 2019-06-12 MED ORDER — BENZONATATE 100 MG PO CAPS
200.0000 mg | ORAL_CAPSULE | Freq: Three times a day (TID) | ORAL | Status: DC | PRN
  Administered 2019-06-14: 200 mg via ORAL
  Filled 2019-06-12: qty 2

## 2019-06-12 MED ORDER — HYDROCODONE-ACETAMINOPHEN 5-325 MG PO TABS: 1.0000 | ORAL_TABLET | ORAL | Status: DC | PRN

## 2019-06-12 MED ORDER — ALBUTEROL SULFATE HFA 108 (90 BASE) MCG/ACT IN AERS
2.0000 | INHALATION_SPRAY | Freq: Four times a day (QID) | RESPIRATORY_TRACT | Status: DC | PRN
  Administered 2019-06-12: 2 via RESPIRATORY_TRACT
  Filled 2019-06-12: qty 6.7

## 2019-06-12 MED ORDER — ENOXAPARIN SODIUM 40 MG/0.4ML ~~LOC~~ SOLN
40.0000 mg | SUBCUTANEOUS | Status: DC
  Administered 2019-06-12 – 2019-06-16 (×5): 40 mg via SUBCUTANEOUS
  Filled 2019-06-12 (×5): qty 0.4

## 2019-06-12 MED ORDER — ZINC SULFATE 220 (50 ZN) MG PO CAPS
220.0000 mg | ORAL_CAPSULE | Freq: Every day | ORAL | Status: DC
  Administered 2019-06-12 – 2019-06-16 (×5): 220 mg via ORAL
  Filled 2019-06-12 (×5): qty 1

## 2019-06-12 MED ORDER — SODIUM CHLORIDE 0.9 % IV SOLN
100.0000 mg | INTRAVENOUS | Status: AC
  Administered 2019-06-13 – 2019-06-16 (×4): 100 mg via INTRAVENOUS
  Filled 2019-06-12: qty 20
  Filled 2019-06-12 (×3): qty 100

## 2019-06-12 MED ORDER — SODIUM CHLORIDE 0.9% FLUSH
3.0000 mL | Freq: Two times a day (BID) | INTRAVENOUS | Status: DC
  Administered 2019-06-12 – 2019-06-16 (×8): 3 mL via INTRAVENOUS

## 2019-06-12 MED ORDER — ONDANSETRON HCL 4 MG/2ML IJ SOLN: 4.0000 mg | Freq: Four times a day (QID) | INTRAMUSCULAR | Status: DC | PRN

## 2019-06-12 MED ORDER — FAMOTIDINE 20 MG PO TABS
20.0000 mg | ORAL_TABLET | Freq: Every day | ORAL | Status: DC
  Administered 2019-06-12 – 2019-06-13 (×2): 20 mg via ORAL
  Filled 2019-06-12 (×2): qty 1

## 2019-06-12 MED ORDER — HYDROCOD POLST-CPM POLST ER 10-8 MG/5ML PO SUER
5.0000 mL | Freq: Two times a day (BID) | ORAL | Status: DC | PRN
  Administered 2019-06-12 – 2019-06-14 (×3): 5 mL via ORAL
  Filled 2019-06-12 (×3): qty 5

## 2019-06-12 NOTE — ED Notes (Signed)
carelink called  

## 2019-06-12 NOTE — Progress Notes (Signed)
PROGRESS NOTE    Stacey [name]  [id number] DOB: Jun 21, 1941 DOA: 06/11/2019 PCP: Shon Baton, MD   Brief Narrative:  Per HPI: Stacey [name] is a 78 y.o. female with medical history significant of COVID 19 infection, interstitial lung disease, usually on oxygen at night only 2 L.,  Hypertension, depression, GERD, rheumatoid arthritis     Presented with   worsening shortness of breath Patient tested positive on 18 November for Covid Initially had fairly mild symptoms But eventually progressed to malaise fatigue body aches significant shortness of breath Patient had home oxygen and started herself on 2 L for the past 2 days  Patient is never smoker but she has been on prednisone for years.  Has known interstitial lung disease. Been on immune suppressive medications for years. Patient diagnosed rheumatoid arthritis.  11/30: Patient admitted for COVID-19 pneumonia and had significant malaise and muscle aches and weakness as well as hypoxemia with ambulation.  She has been started on Decadron as well as remdesivir and is currently on room air, but is feeling very poorly still needs to be monitored in the hospital setting.  We will plan to transfer to [facility] facility, since it appears no beds are available today.  Assessment & Plan:   Active Problems:   Rheumatoid arthritis ([place])   Asthma   ILD (interstitial lung disease) ([place])   Essential hypertension   Acute respiratory failure with hypoxia ([facility])   Pneumonia due to COVID-19 virus   COVID-19 pneumonia -Dyspnea/hypoxemia particularly with ambulation noted; appears to still require hospital monitoring -Continue to monitor inflammatory biomarkers -Maintain on dexamethasone and remdesivir as prescribed -Patient is still quite symptomatic, maintained on IV fluid  Interstitial lung disease -Continue home oxygen as needed  Rheumatoid Arthritis -Chronic and stable  Asthma -Continue home medications and avoid  nebulizers -No active wheezing noted  Essential HTN -Controlled -Continue diltiazem   DVT prophylaxis: Lovenox Code Status: Full Family Communication:  Disposition Plan: Transfer to [facility] for further treatment and evaluation regarding COVID-19 pneumonia   Consultants:   None  Procedures:   None  Antimicrobials:  Anti-infectives (From admission, onward)   Start     Dose/Rate Route Frequency Ordered Stop   06/13/19 1000  remdesivir 100 mg in sodium chloride 0.9 % 250 mL IVPB     100 mg 500 mL/hr over 30 Minutes Intravenous Every 24 hours 06/12/19 0117 06/17/19 0959   06/12/19 0200  remdesivir 200 mg in sodium chloride 0.9 % 250 mL IVPB     200 mg 500 mL/hr over 30 Minutes Intravenous Once 06/12/19 0115 06/12/19 0245       Subjective: Patient seen and evaluated today with ongoing symptoms of weakness/malaise and fatigue. She becomes very tachypneic and hypoxemic on ambulation and is overall feeling poorly. No acute events since admission.  Objective: Vitals:   06/12/19 0700 06/12/19 0800 06/12/19 0900 06/12/19 1000  BP: 140/71 (!) 162/74 138/71 (!) 144/75  Pulse: 73 73 73 78  Resp: 17 17 (!) 22 15  Temp:      TempSrc:      SpO2: 91% 94% 91% 93%  Weight:      Height:        Intake/Output Summary (Last 24 hours) at 06/12/2019 1041 Last data filed at 06/12/2019 0245 Gross per 24 hour  Intake 250 ml  Output -  Net 250 ml   Filed Weights   06/11/19 2053  Weight: 63.5 kg    Examination:  General exam: Appears calm  and comfortable  Respiratory system: Clear to auscultation. Respiratory effort normal. Currently RA. Cardiovascular system: S1 & S2 heard, RRR. No JVD, murmurs, rubs, gallops or clicks. No pedal edema. Gastrointestinal system: Abdomen is nondistended, soft and nontender. No organomegaly or masses felt. Normal bowel sounds heard. Central nervous system: Alert and awake. Extremities: No edema. Skin: No rashes, lesions or ulcers Psychiatry: Flat  affect.    Data Reviewed: I have personally reviewed following labs and imaging studies  CBC: Recent Labs  Lab 06/11/19 2154  WBC 8.8  NEUTROABS 4.6  HGB 12.5  HCT 39.8  MCV 91.5  PLT [id number]   Basic Metabolic Panel: Recent Labs  Lab 06/11/19 2154  NA 137  K 5.2*  CL 101  CO2 29  GLUCOSE 101*  BUN 10  CREATININE 0.77  CALCIUM 8.3*   GFR: Estimated Creatinine Clearance: 53.4 mL/min (by C-G formula based on SCr of 0.77 mg/dL). Liver Function Tests: Recent Labs  Lab 06/11/19 2154  AST 31  ALT 19  ALKPHOS 58  BILITOT 1.3*  PROT 6.1*  ALBUMIN 3.1*   No results for input(s): LIPASE, AMYLASE in the last 168 hours. No results for input(s): AMMONIA in the last 168 hours. Coagulation Profile: No results for input(s): INR, PROTIME in the last 168 hours. Cardiac Enzymes: No results for input(s): CKTOTAL, CKMB, CKMBINDEX, TROPONINI in the last 168 hours. BNP (last 3 results) No results for input(s): PROBNP in the last 8760 hours. HbA1C: No results for input(s): HGBA1C in the last 72 hours. CBG: No results for input(s): GLUCAP in the last 168 hours. Lipid Profile: Recent Labs    06/11/19 2338  TRIG 79   Thyroid Function Tests: No results for input(s): TSH, T4TOTAL, FREET4, T3FREE, THYROIDAB in the last 72 hours. Anemia Panel: Recent Labs    06/11/19 2338  FERRITIN 131   Sepsis Labs: Recent Labs  Lab 06/11/19 2154 06/11/19 2338  PROCALCITON  --  <0.10  LATICACIDVEN 0.8  --     No results found for this or any previous visit (from the past 240 hour(s)).       Radiology Studies: Dg Chest Portable 1 View  Result Date: 06/11/2019 CLINICAL DATA:  78 year old female with cough and shortness of breath. Positive for COVID-19 1 week ago. EXAM: PORTABLE CHEST 1 VIEW COMPARISON:  Chest radiographs 05/06/2019 and earlier. FINDINGS: Portable AP upright view at 2127 hours. Chronic interstitial lung disease. Stable lung volumes. Stable cardiac size and  mediastinal contours. Calcified aortic atherosclerosis. Visualized tracheal air column is within normal limits. Compared to a portable chest on 12/17/2018, there is increased patchy opacity at the left lung base, and minimal nodular opacity appears new at the right lung base. Elsewhere lung markings appear stable. No pleural effusion is evident. Stable cholecystectomy clips. Negative visible bowel gas pattern. No acute osseous abnormality identified. IMPRESSION: Chronic pulmonary interstitial disease. Evidence of new patchy opacity at the lung bases left greater than right, suspicious for COVID-19 pneumonia in this clinical setting. No pleural effusion. Electronically Signed   By: Genevie Ann M.D.   On: 06/11/2019 21:50        Scheduled Meds: . amitriptyline  25 mg Oral QHS  . dexamethasone (DECADRON) injection  6 mg Intravenous Q24H  . diltiazem  180 mg Oral Daily  . enoxaparin (LOVENOX) injection  40 mg Subcutaneous Q24H  . gabapentin  600 mg Oral QHS  . mometasone-formoterol  2 puff Inhalation BID  . pantoprazole  40 mg Oral BID AC  .  pravastatin  20 mg Oral QPM  . venlafaxine XR  75 mg Oral QHS  . vitamin C  500 mg Oral Daily  . zinc sulfate  220 mg Oral Daily   Continuous Infusions: . sodium chloride 75 mL/hr at 06/12/19 0358  . [START ON 06/13/2019] remdesivir 100 mg in NS 250 mL       LOS: 1 day    Time spent: 30 minutes    Sharlyn Odonnel Darleen Crocker, DO [facility] Pager [phone]  If 7PM-7AM, please contact night-coverage [email or web address] Password [email or web address] 06/12/2019, 10:41 AM

## 2019-06-12 NOTE — ED Provider Notes (Signed)
78 year old female received at signout from Dr. Roslynn Amble pending hospitalist admission.  In brief, the patient has a history of interstitial lung disease and tested positive for COVID-19 on 11/18.  She wears 2 L of oxygen at home, but only at night.  She is here with worsening malaise, fatigue, body aches, shortness of breath.  Although she initially looked well-appearing in the ER, ambulation was attempted in the ER, and the patient became very tachypneic and hypoxic to 86%.  Dr. Roslynn Amble planned for admission at shift change.  Please see his note for further work-up and medical decision making.  Dr. Roel Cluck, hospitalist, has accepted the patient for admission.  The patient appears reasonably stabilized for admission considering the current resources, flow, and capabilities available in the ED at this time, and I doubt any other [facility] requiring further screening and/or treatment in the ED prior to admission.    Joline Maxcy A, PA-C 06/12/19 0127    Lucrezia Starch, MD 06/12/19 [phone]

## 2019-06-12 NOTE — ED Notes (Signed)
Spent time speaking with husband to pt. Able to relieve some anxiety with prayer and allowing him to stand at window and wave/blow kiss to wife. Husband appreciative and tearful.

## 2019-06-12 NOTE — Plan of Care (Signed)
Priscille Loveless, RN

## 2019-06-12 NOTE — Progress Notes (Signed)
Notified daughter of progress all questions answered and this nurse's number shared for further communication.

## 2019-06-12 NOTE — ED Notes (Signed)
Pt unable to get a full sputum culture in spite of coughing deeply for 20 minutes. MD made aware

## 2019-06-13 LAB — CBC WITH DIFFERENTIAL/PLATELET
Abs Immature Granulocytes: 0.06 10*3/uL (ref 0.00–0.07)
Basophils Absolute: 0 10*3/uL (ref 0.0–0.1)
Basophils Relative: 0 %
Eosinophils Absolute: 0.1 10*3/uL (ref 0.0–0.5)
Eosinophils Relative: 1 %
HCT: 36.7 % (ref 36.0–46.0)
Hemoglobin: 11.7 g/dL — ABNORMAL LOW (ref 12.0–15.0)
Immature Granulocytes: 1 %
Lymphocytes Relative: 26 %
Lymphs Abs: 2.5 10*3/uL (ref 0.7–4.0)
MCH: 28.5 pg (ref 26.0–34.0)
MCHC: 31.9 g/dL (ref 30.0–36.0)
MCV: 89.5 fL (ref 80.0–100.0)
Monocytes Absolute: 1.3 10*3/uL — ABNORMAL HIGH (ref 0.1–1.0)
Monocytes Relative: 13 %
Neutro Abs: 5.7 10*3/uL (ref 1.7–7.7)
Neutrophils Relative %: 59 %
Platelets: 342 10*3/uL (ref 150–400)
RBC: 4.1 MIL/uL (ref 3.87–5.11)
RDW: 13.8 % (ref 11.5–15.5)
WBC: 9.6 10*3/uL (ref 4.0–10.5)
nRBC: 0 % (ref 0.0–0.2)

## 2019-06-13 LAB — COMPREHENSIVE METABOLIC PANEL
ALT: 16 U/L (ref 0–44)
AST: 20 U/L (ref 15–41)
Albumin: 2.9 g/dL — ABNORMAL LOW (ref 3.5–5.0)
Alkaline Phosphatase: 56 U/L (ref 38–126)
Anion gap: 11 (ref 5–15)
BUN: 13 mg/dL (ref 8–23)
CO2: 25 mmol/L (ref 22–32)
Calcium: 8.4 mg/dL — ABNORMAL LOW (ref 8.9–10.3)
Chloride: 103 mmol/L (ref 98–111)
Creatinine, Ser: 0.67 mg/dL (ref 0.44–1.00)
GFR calc Af Amer: >60 mL/min (ref >60)
GFR calc non Af Amer: >60 mL/min (ref >60)
Glucose, Bld: 75 mg/dL (ref 70–99)
Potassium: 3.9 mmol/L (ref 3.5–5.1)
Sodium: 139 mmol/L (ref 135–145)
Total Bilirubin: 0.6 mg/dL (ref 0.3–1.2)
Total Protein: 5.7 g/dL — ABNORMAL LOW (ref 6.5–8.1)

## 2019-06-13 LAB — FERRITIN: Ferritin: 177 ng/mL (ref 11–307)

## 2019-06-13 LAB — D-DIMER, QUANTITATIVE: D-Dimer, Quant: 1.45 ug/mL-FEU — ABNORMAL HIGH (ref 0.00–0.50)

## 2019-06-13 LAB — MAGNESIUM: Magnesium: 1.9 mg/dL (ref 1.7–2.4)

## 2019-06-13 LAB — C-REACTIVE PROTEIN: CRP: 7 mg/dL — ABNORMAL HIGH (ref <1.0)

## 2019-06-13 LAB — ABO/RH: ABO/RH(D): A POS

## 2019-06-13 MED ORDER — IPRATROPIUM-ALBUTEROL 20-100 MCG/ACT IN AERS
1.0000 | INHALATION_SPRAY | Freq: Four times a day (QID) | RESPIRATORY_TRACT | Status: DC
  Administered 2019-06-14 – 2019-06-16 (×3): 1 via RESPIRATORY_TRACT
  Filled 2019-06-13: qty 4

## 2019-06-13 MED ORDER — DEXAMETHASONE SODIUM PHOSPHATE 10 MG/ML IJ SOLN
6.0000 mg | Freq: Two times a day (BID) | INTRAMUSCULAR | Status: DC
  Administered 2019-06-13 – 2019-06-14 (×2): 6 mg via INTRAVENOUS
  Filled 2019-06-13 (×2): qty 1

## 2019-06-13 MED ORDER — IPRATROPIUM-ALBUTEROL 20-100 MCG/ACT IN AERS
4.0000 | INHALATION_SPRAY | Freq: Four times a day (QID) | RESPIRATORY_TRACT | Status: DC
  Administered 2019-06-13: 4 via RESPIRATORY_TRACT
  Filled 2019-06-13: qty 4

## 2019-06-13 MED ORDER — ALBUTEROL SULFATE HFA 108 (90 BASE) MCG/ACT IN AERS
4.0000 | INHALATION_SPRAY | RESPIRATORY_TRACT | Status: DC | PRN
  Filled 2019-06-13: qty 6.7

## 2019-06-13 NOTE — Progress Notes (Signed)
PROGRESS NOTE  Stacey [name] [id number] DOB: 10/16/1940 DOA: 06/11/2019  PCP: Shon Baton, MD  Brief History/Interval Summary: 78 y.o.femalewith medical history of interstitial lung disease,usually on oxygen at night only 2 L, Hypertension, depression, GERD, rheumatoid arthritis who initially tested positive for COVID-19 on November 18.  She initially had mild symptoms.  However her symptoms worsened.  He came more short of breath.  So she decided to come into the hospital.  She is immunocompromise due to the fact that she is on chronic steroids.  She was hospitalized for further management.  Reason for Visit: Pneumonia due to COVID-19.  Consultants: None  Procedures: None  Antibiotics: Anti-infectives (From admission, onward)   Start     Dose/Rate Route Frequency Ordered Stop   06/13/19 1000  remdesivir 100 mg in sodium chloride 0.9 % 250 mL IVPB     100 mg 500 mL/hr over 30 Minutes Intravenous Every 24 hours 06/12/19 0117 06/17/19 0959   06/12/19 0200  remdesivir 200 mg in sodium chloride 0.9 % 250 mL IVPB     200 mg 500 mL/hr over 30 Minutes Intravenous Once 06/12/19 0115 06/12/19 0245      Subjective/Interval History: Patient states that she continues to have some shortness of breath mainly with exertion.  Occasional dry cough is present.  Denies any chest pain.  No nausea vomiting.   Assessment/Plan:  Pneumonia due to COVID-19  COVID-19 Labs  Recent Labs    06/11/19 2338 06/13/19 0207  DDIMER 1.42* 1.45*  FERRITIN 131 177  LDH 201*  --   CRP 4.7* 7.0*    Lab Results  Component Value Date   SARSCOV2NAA Detected (A) 05/31/2019   [place] NEGATIVE 05/06/2019   SARSCOV2NAA NEGATIVE 03/24/2019   [place] NEGATIVE 12/17/2018     Objective findings: Fever: Afebrile Oxygen requirements: Currently on room air saturating in the 90s.  COVID 19 Therapeutics: Antibacterials: None Remdesivir: Day 2 Steroids: Dexamethasone 6 mg daily Diuretics:  Not on diuretics currently Actemra: Not given Convalescent Plasma: Not given yet Vitamin C and Zinc: Continue PUD Prophylaxis: Protonix DVT Prophylaxis:  Lovenox 40 mg daily  Chest x-ray did show evidence for new patchy opacity at the lung bases left greater than right.  On remdesivir and steroids.  She saturating normally on room air currently.  Usually uses oxygen at nighttime for her interstitial lung disease.  Inflammatory markers being followed.  CRP noted to be higher today at 7.0.  We will increase the dose of steroids.  D-dimer stable at 1.45.  Ferritin normal at 177.  Continue with incentive spirometry.  Mobilization as much as tolerated.  Antitussive agents.  She is noted to be wheezing today.  We will initiate inhalers.  History of interstitial lung disease on chronic steroids Followed by Dr. Chase Caller with pulmonology.  Her home medication list did not show steroids.  I called the patient to confirm.  She tells me that she indeed takes Medrol 4 mg alternating with 2 mg.  She has been taking this for years.  This has been added to her home medication list.  She is also on Rituxan.  When she was seen by Dr. Chase Caller on 11/11 he recommended starting the patient on nintedanib.  History of rheumatoid arthritis Stable.  Continue to monitor.  History of asthma Noted to be wheezing today.  Start albuterol inhalers.  Continue with [facility]  Essential hypertension Continue diltiazem.  Blood pressure is reasonably well controlled.  DVT Prophylaxis: Lovenox Code Status: Full code Family  Communication: Discussed with the patient.  Will update her husband. Disposition Plan: Management as outlined above.  Mobilize as tolerated.  Hopefully will be able to return home when improved.   Medications:  Scheduled: . amitriptyline  25 mg Oral QHS  . dexamethasone (DECADRON) injection  6 mg Intravenous Q12H  . diltiazem  180 mg Oral Daily  . enoxaparin (LOVENOX) injection  40 mg Subcutaneous Q24H   . gabapentin  600 mg Oral QHS  . Ipratropium-Albuterol  4 puff Inhalation QID  . mometasone-formoterol  2 puff Inhalation BID  . pantoprazole  40 mg Oral BID AC  . pravastatin  20 mg Oral QPM  . sodium chloride flush  3 mL Intravenous Q12H  . venlafaxine XR  75 mg Oral QHS  . vitamin C  500 mg Oral Daily  . zinc sulfate  220 mg Oral Daily   Continuous: . remdesivir 100 mg in NS 250 mL 100 mg (06/13/19 1106)   [id number], albuterol, benzonatate, chlorpheniramine-HYDROcodone, HYDROcodone-acetaminophen, ondansetron **OR** ondansetron (ZOFRAN) IV   Objective:  Vital Signs  Vitals:   06/12/19 2025 06/13/19 0526 06/13/19 0800 06/13/19 0838  BP: 137/79 136/80 (!) 122/59 (!) 122/59  Pulse: 81 73 72   Resp: 15 16 16    Temp: 97.8 F (36.6 C) 98.7 F (37.1 C) 98.4 F (36.9 C)   TempSrc: Oral Oral Oral   SpO2: 98% 96% 97%   Weight:      Height:        Intake/Output Summary (Last 24 hours) at 06/13/2019 1145 Last data filed at 06/12/2019 1840 Gross per 24 hour  Intake 1400 ml  Output -  Net 1400 ml   Filed Weights   06/11/19 2053  Weight: 63.5 kg    General appearance: Awake alert.  In no distress Resp: Tachypneic at rest.  No use of accessory muscles.  Coarse breath sounds.  Wheezing heard bilaterally.  Crackles at the bases. Cardio: S1-S2 is normal regular.  No S3-S4.  No rubs murmurs or bruit GI: Abdomen is soft.  Nontender nondistended.  Bowel sounds are present normal.  No masses organomegaly Extremities: No edema.  Full range of motion of lower extremities. Neurologic: Alert and oriented x3.  No focal neurological deficits.    Lab Results:  Data Reviewed: I have personally reviewed following labs and imaging studies  CBC: Recent Labs  Lab 06/11/19 2154 06/13/19 0207  WBC 8.8 9.6  NEUTROABS 4.6 5.7  HGB 12.5 11.7*  HCT 39.8 36.7  MCV 91.5 89.5  PLT 337 [id number]    Basic Metabolic Panel: Recent Labs  Lab 06/11/19 2154 06/13/19 0207  NA 137 139  K  5.2* 3.9  CL 101 103  CO2 29 25  GLUCOSE 101* 75  BUN 10 13  CREATININE 0.77 0.67  CALCIUM 8.3* 8.4*  MG  --  1.9    GFR: Estimated Creatinine Clearance: 53.4 mL/min (by C-G formula based on SCr of 0.67 mg/dL).  Liver Function Tests: Recent Labs  Lab 06/11/19 2154 06/13/19 0207  AST 31 20  ALT 19 16  ALKPHOS 58 56  BILITOT 1.3* 0.6  PROT 6.1* 5.7*  ALBUMIN 3.1* 2.9*    Lipid Profile: Recent Labs    06/11/19 2338  TRIG 79    Anemia Panel: Recent Labs    06/11/19 2338 06/13/19 0207  FERRITIN 131 177    Recent Results (from the past 240 hour(s))  Blood Culture (routine x 2)     Status: None (Preliminary result)  Collection Time: 06/11/19 11:37 PM   Specimen: BLOOD  Result Value Ref Range Status   Specimen Description   Final    BLOOD RIGHT ANTECUBITAL Performed at [facility] [address], [place], [place] [address]    Special Requests   Final    BOTTLES DRAWN AEROBIC AND ANAEROBIC Blood Culture adequate volume Performed at [facility] [address] [place], [place] [address]    Culture   Final    NO GROWTH 1 DAY Performed at [facility], [place] [address], [place], [place] [address]    Report Status PENDING  Incomplete  Blood Culture (routine x 2)     Status: None (Preliminary result)   Collection Time: 06/11/19 11:37 PM   Specimen: BLOOD LEFT FOREARM  Result Value Ref Range Status   Specimen Description   Final    BLOOD LEFT FOREARM Performed at [facility] [address], [place], [place] [address]    Special Requests   Final    BOTTLES DRAWN AEROBIC AND ANAEROBIC Blood Culture adequate volume Performed at [facility] [address], [place], [place] [address]    Culture   Final    NO GROWTH 1 DAY Performed at [facility], [place] [address], [place], [place] [address]    Report Status PENDING  Incomplete      Radiology Studies: Dg Chest  Portable 1 View  Result Date: 06/11/2019 CLINICAL DATA:  78 year old female with cough and shortness of breath. Positive for COVID-19 1 week ago. EXAM: PORTABLE CHEST 1 VIEW COMPARISON:  Chest radiographs 05/06/2019 and earlier. FINDINGS: Portable AP upright view at 2127 hours. Chronic interstitial lung disease. Stable lung volumes. Stable cardiac size and mediastinal contours. Calcified aortic atherosclerosis. Visualized tracheal air column is within normal limits. Compared to a portable chest on 12/17/2018, there is increased patchy opacity at the left lung base, and minimal nodular opacity appears new at the right lung base. Elsewhere lung markings appear stable. No pleural effusion is evident. Stable cholecystectomy clips. Negative visible bowel gas pattern. No acute osseous abnormality identified. IMPRESSION: Chronic pulmonary interstitial disease. Evidence of new patchy opacity at the lung bases left greater than right, suspicious for COVID-19 pneumonia in this clinical setting. No pleural effusion. Electronically Signed   By: Genevie Ann M.D.   On: 06/11/2019 21:50       LOS: 2 days   [facility] Pager on [email or web address]  06/13/2019, 11:45 AM

## 2019-06-13 NOTE — Plan of Care (Signed)
  Problem: Education: Goal: Knowledge of risk factors and measures for prevention of condition will improve Outcome: Progressing   Problem: Respiratory: Goal: Will maintain a patent airway Outcome: Progressing   

## 2019-06-13 NOTE — TOC Initial Note (Signed)
Transition of Care [facility]) - Initial/Assessment Note    Patient Details  Name: Stacey [name] MRN: [id number] Date of Birth: 17-May-1941  Transition of Care [facility] CM/SW Contact:    Ninfa Meeker, RN Phone Number: 06/13/2019, 3:21 PM  Clinical Narrative: Patient is a  78 y.o.femalewith medical history of interstitial lung disease,usually on oxygen at night only 2 L, Hypertension, depression, GERD, rheumatoid arthritis who initially tested positive for COVID-19 on November 18.  She initially had mild symptoms.  However her symptoms worsened and she came to ED.[facility]  She is immunocompromise due to the fact that she is on chronic steroids. Patient was admitted for further treatment for Pneumonia due to COVID 19. On Remdesivir until 12/5, IV steriods.  Case manager will continue to monitor.                       Patient Goals and CMS Choice        Expected Discharge Plan and Services                                                Prior Living Arrangements/Services                       Activities of Daily Living Home Assistive Devices/Equipment: Bedside commode/3-in-1, Oxygen, Nebulizer ADL Screening (condition at time of admission) Patient's cognitive ability adequate to safely complete daily activities?: Yes Is the patient deaf or have difficulty hearing?: No Does the patient have difficulty seeing, even when wearing glasses/contacts?: No Does the patient have difficulty concentrating, remembering, or making decisions?: No Patient able to express need for assistance with ADLs?: Yes Does the patient have difficulty dressing or bathing?: No Independently performs ADLs?: Yes (appropriate for developmental age) Does the patient have difficulty walking or climbing stairs?: Yes(secondary to shortness of breath) Weakness of Legs: Both Weakness of Arms/Hands: None  Permission Sought/Granted                  Emotional Assessment               Admission diagnosis:  SOB (shortness of breath) [R06.02] ILD (interstitial lung disease) ([place]) [J84.9] Acute on chronic respiratory failure with hypoxia ([place]) [J96.21] Pneumonia due to COVID-19 virus [U07.1, J12.89] Patient Active Problem List   Diagnosis Date Noted  . Pneumonia due to COVID-19 virus 06/11/2019  . 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[place]) 05/07/2019  . Acute asthma exacerbation 12/18/2018  . Elevated troponin 12/18/2018  . Acute bronchitis 08/30/2018  . Chronic respiratory failure with hypoxia ([place]) 04/06/2018  . Acute respiratory failure with hypoxia ([place]) 03/26/2018  . Essential hypertension 03/24/2018  . Depression 03/24/2018  . Asthma exacerbation 03/23/2018  . DOE (dyspnea on exertion) 03/21/2018  . 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 esophagitis 03/21/2018  . Chronic lung disease   . Hypoxia   . Dyspnea 01/23/2018  . ILD (interstitial lung disease) ([place]) 01/23/2018  . 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 Multiple pulmonary nodules/RA lung dz  05/29/2013  . Cough variant asthma vs uacs  05/29/2013  . Rheumatoid arthritis ([place]) 01/13/2012  . Osteopenia 01/13/2012  . Lichen sclerosus et atrophicus of the vulva 01/13/2012  . Asthma 01/13/2012   PCP:  Shon Baton, MD Pharmacy:   [facility], [place] - [address]  [place] [address] Phone: [phone] Fax: [phone]     Social Determinants of Health (SDOH) Interventions    Readmission Risk Interventions No flowsheet data found.

## 2019-06-13 NOTE — Telephone Encounter (Signed)
Called and spoke with Anderson Malta at [facility] letting her know that we are going to do an appeal. Anderson Malta verbalized understanding and stated that we would need to attach the denial letter with the supportive documentation. I have all the documentation and will fax it to the provided fax number. Routing to myself to follow up on.

## 2019-06-13 NOTE — Telephone Encounter (Signed)
Anderson Malta from [facility] calling to see if Dr. Chase Caller wants to appeal for [facility] since [facility] denied prior auth.  [phone] ext. [phone].  If Dr. Chase Caller does decide to do an appeal with [facility], will need a copy of [place] denial and any clinical documentation to [facility].  An appeals nurse will review and reach out to office.  Fax:  [phone].  Please advise.

## 2019-06-13 NOTE — Progress Notes (Signed)
Notified daughter of progress all questions answered and this nurse's number shared for further communication.

## 2019-06-14 LAB — CBC WITH DIFFERENTIAL/PLATELET
Abs Immature Granulocytes: 0.05 10*3/uL (ref 0.00–0.07)
Basophils Absolute: 0 10*3/uL (ref 0.0–0.1)
Basophils Relative: 0 %
Eosinophils Absolute: 0 10*3/uL (ref 0.0–0.5)
Eosinophils Relative: 0 %
HCT: 35.1 % — ABNORMAL LOW (ref 36.0–46.0)
Hemoglobin: 11.1 g/dL — ABNORMAL LOW (ref 12.0–15.0)
Immature Granulocytes: 1 %
Lymphocytes Relative: 21 %
Lymphs Abs: 1.2 10*3/uL (ref 0.7–4.0)
MCH: 28.3 pg (ref 26.0–34.0)
MCHC: 31.6 g/dL (ref 30.0–36.0)
MCV: 89.5 fL (ref 80.0–100.0)
Monocytes Absolute: 0.2 10*3/uL (ref 0.1–1.0)
Monocytes Relative: 3 %
Neutro Abs: 4.1 10*3/uL (ref 1.7–7.7)
Neutrophils Relative %: 75 %
Platelets: 353 10*3/uL (ref 150–400)
RBC: 3.92 MIL/uL (ref 3.87–5.11)
RDW: 13.8 % (ref 11.5–15.5)
WBC: 5.5 10*3/uL (ref 4.0–10.5)
nRBC: 0 % (ref 0.0–0.2)

## 2019-06-14 LAB — COMPREHENSIVE METABOLIC PANEL
ALT: 16 U/L (ref 0–44)
AST: 16 U/L (ref 15–41)
Albumin: 3 g/dL — ABNORMAL LOW (ref 3.5–5.0)
Alkaline Phosphatase: 56 U/L (ref 38–126)
Anion gap: 9 (ref 5–15)
BUN: 15 mg/dL (ref 8–23)
CO2: 24 mmol/L (ref 22–32)
Calcium: 8.5 mg/dL — ABNORMAL LOW (ref 8.9–10.3)
Chloride: 105 mmol/L (ref 98–111)
Creatinine, Ser: 0.59 mg/dL (ref 0.44–1.00)
GFR calc Af Amer: >60 mL/min (ref >60)
GFR calc non Af Amer: >60 mL/min (ref >60)
Glucose, Bld: 127 mg/dL — ABNORMAL HIGH (ref 70–99)
Potassium: 4.1 mmol/L (ref 3.5–5.1)
Sodium: 138 mmol/L (ref 135–145)
Total Bilirubin: 0.2 mg/dL — ABNORMAL LOW (ref 0.3–1.2)
Total Protein: 6 g/dL — ABNORMAL LOW (ref 6.5–8.1)

## 2019-06-14 LAB — MAGNESIUM: Magnesium: 2.1 mg/dL (ref 1.7–2.4)

## 2019-06-14 LAB — C-REACTIVE PROTEIN: CRP: 5.8 mg/dL — ABNORMAL HIGH (ref <1.0)

## 2019-06-14 LAB — D-DIMER, QUANTITATIVE: D-Dimer, Quant: 1.09 ug/mL-FEU — ABNORMAL HIGH (ref 0.00–0.50)

## 2019-06-14 LAB — FERRITIN: Ferritin: 167 ng/mL (ref 11–307)

## 2019-06-14 MED ORDER — DEXAMETHASONE SODIUM PHOSPHATE 10 MG/ML IJ SOLN
6.0000 mg | INTRAMUSCULAR | Status: DC
  Administered 2019-06-15: 6 mg via INTRAVENOUS
  Filled 2019-06-14: qty 1

## 2019-06-14 NOTE — Progress Notes (Addendum)
Pt declines this rn calling family/husband Ned this hs for updates. Pt states she has spoken to husband multiple times today, "no need to bother him tonight", no questions at this time.

## 2019-06-14 NOTE — Progress Notes (Signed)
Stacey [name]  [id number] DOB: 1940-11-25 DOA: 06/11/2019 PCP: Shon Baton, MD    Brief Narrative:  78 year old with a history of interstitial lung disease on chronic steroids requiring 2 L nasal cannula oxygen at night, HTN, depression, GERD, and RA who tested positive for Covid November 18.  She initially had very mild symptoms but unfortunately progressed and became more short of breath.  Significant Events:   COVID-19 specific Treatment: Remdesivir 11/30 > Decadron - chronically  Subjective: Resting comfortably in her bed on room air.  Denies chest pain nausea vomiting or abdominal pain.  Has many questions about going home.  Assessment & Plan:  Covid pneumonia -acute on chronic hypoxic respiratory failure Continue remdesivir with last dose due 12/4 -wean daytime oxygen as able -continue 2 L nasal cannula at night on schedule as per home regimen  Recent Labs  Lab 06/11/19 2154 06/11/19 2338 06/13/19 0207 06/14/19 0200  DDIMER  --  1.42* 1.45* 1.09*  FERRITIN  --  131 177 167  CRP  --  4.7* 7.0* 5.8*  ALT 19  --  16 16  PROCALCITON  --  <0.10  --   --     ILD on chronic steroids - chronic hypoxic respiratory failure Followed by Dr. Chase Caller -chronically on Medrol 4 mg alternating with 2 mg -on Rituxan -Dr. Chase Caller recommended starting nintedanib at the time of her visit 11/11 RA  History of asthma Quiescent at this time  HTN Well-controlled at this time  DVT prophylaxis: Lovenox Code Status: FULL CODE Family Communication:  Disposition Plan: Expect discharge 12/4  Consultants:  none  Antimicrobials:  None  Objective: Blood pressure 117/86, pulse 62, temperature 97.6 F (36.4 C), temperature source Oral, resp. rate 12, height 5\' 6"  (1.676 m), weight 63.5 kg, SpO2 96 %.  Intake/Output Summary (Last 24 hours) at 06/14/2019 0920 Last data filed at 06/14/2019 0600 Gross per 24 hour  Intake 730 ml  Output -  Net 730 ml   Filed Weights   06/11/19 2053  Weight: 63.5 kg    Examination: General: No acute respiratory distress Lungs: Fine diffuse crackles with no wheezing Cardiovascular: Regular rate and rhythm without murmur gallop or rub normal S1 and S2 Abdomen: Nontender, nondistended, soft, bowel sounds positive, no rebound, no ascites, no appreciable mass Extremities: No significant cyanosis, clubbing, or edema bilateral lower extremities  CBC: Recent Labs  Lab 06/11/19 2154 06/13/19 0207 06/14/19 0200  WBC 8.8 9.6 5.5  NEUTROABS 4.6 5.7 4.1  HGB 12.5 11.7* 11.1*  HCT 39.8 36.7 35.1*  MCV 91.5 89.5 89.5  PLT 337 342 0000000   Basic Metabolic Panel: Recent Labs  Lab 06/11/19 2154 06/13/19 0207 06/14/19 0200  NA 137 139 138  K 5.2* 3.9 4.1  CL 101 103 105  CO2 29 25 24   GLUCOSE 101* 75 127*  BUN 10 13 15   CREATININE 0.77 0.67 0.59  CALCIUM 8.3* 8.4* 8.5*  MG  --  1.9 2.1   GFR: Estimated Creatinine Clearance: 53.4 mL/min (by C-G formula based on SCr of 0.59 mg/dL).  Liver Function Tests: Recent Labs  Lab 06/11/19 2154 06/13/19 0207 06/14/19 0200  AST 31 20 16   ALT 19 16 16   ALKPHOS 58 56 56  BILITOT 1.3* 0.6 0.2*  PROT 6.1* 5.7* 6.0*  ALBUMIN 3.1* 2.9* 3.0*    HbA1C: Hgb A1c MFr Bld  Date/Time Value Ref Range Status  08/20/2015 11:10 AM 5.9 (H) 4.8 - 5.6 % Final    Comment:  Pre-diabetes: 5.7 - 6.4          Diabetes: >6.4          Glycemic control for adults with diabetes: <7.0     Recent Results (from the past 240 hour(s))  Blood Culture (routine x 2)     Status: None (Preliminary result)   Collection Time: 06/11/19 11:37 PM   Specimen: BLOOD  Result Value Ref Range Status   Specimen Description   Final    BLOOD RIGHT ANTECUBITAL Performed at [facility] [address], [place], [place] [address]    Special Requests   Final    BOTTLES DRAWN AEROBIC AND ANAEROBIC Blood Culture adequate volume Performed at [facility]  [address], [place], [place] [address]    Culture   Final    NO GROWTH 2 DAYS Performed at [facility] [address] [place], [place] [address]    Report Status PENDING  Incomplete  Blood Culture (routine x 2)     Status: None (Preliminary result)   Collection Time: 06/11/19 11:37 PM   Specimen: BLOOD LEFT FOREARM  Result Value Ref Range Status   Specimen Description   Final    BLOOD LEFT FOREARM Performed at [facility] [address], [place], [place] [address]    Special Requests   Final    BOTTLES DRAWN AEROBIC AND ANAEROBIC Blood Culture adequate volume Performed at [facility] [address] [place], [place] [address]    Culture   Final    NO GROWTH 2 DAYS Performed at [facility] [address] [place], [place] [address]    Report Status PENDING  Incomplete     Scheduled Meds: . amitriptyline  25 mg Oral QHS  . dexamethasone (DECADRON) injection  6 mg Intravenous Q12H  . diltiazem  180 mg Oral Daily  . enoxaparin (LOVENOX) injection  40 mg Subcutaneous Q24H  . gabapentin  600 mg Oral QHS  . Ipratropium-Albuterol  1 puff Inhalation QID  . mometasone-formoterol  2 puff Inhalation BID  . pantoprazole  40 mg Oral BID AC  . pravastatin  20 mg Oral QPM  . sodium chloride flush  3 mL Intravenous Q12H  . venlafaxine XR  75 mg Oral QHS  . vitamin C  500 mg Oral Daily  . zinc sulfate  220 mg Oral Daily   Continuous Infusions: . remdesivir 100 mg in NS 250 mL Stopped (06/13/19 1136)     LOS: 3 days   Cherene Altes, MD [facility] Office  [phone] Pager - Text Page per Amion  If 7PM-7AM, please contact night-coverage per Amion 06/14/2019, 9:20 AM

## 2019-06-15 LAB — CBC WITH DIFFERENTIAL/PLATELET
Abs Immature Granulocytes: 0.08 10*3/uL — ABNORMAL HIGH (ref 0.00–0.07)
Basophils Absolute: 0 10*3/uL (ref 0.0–0.1)
Basophils Relative: 0 %
Eosinophils Absolute: 0 10*3/uL (ref 0.0–0.5)
Eosinophils Relative: 0 %
HCT: 35.3 % — ABNORMAL LOW (ref 36.0–46.0)
Hemoglobin: 11.4 g/dL — ABNORMAL LOW (ref 12.0–15.0)
Immature Granulocytes: 1 %
Lymphocytes Relative: 14 %
Lymphs Abs: 1.4 10*3/uL (ref 0.7–4.0)
MCH: 29 pg (ref 26.0–34.0)
MCHC: 32.3 g/dL (ref 30.0–36.0)
MCV: 89.8 fL (ref 80.0–100.0)
Monocytes Absolute: 0.8 10*3/uL (ref 0.1–1.0)
Monocytes Relative: 8 %
Neutro Abs: 7.9 10*3/uL — ABNORMAL HIGH (ref 1.7–7.7)
Neutrophils Relative %: 77 %
Platelets: 347 10*3/uL (ref 150–400)
RBC: 3.93 MIL/uL (ref 3.87–5.11)
RDW: 13.7 % (ref 11.5–15.5)
WBC: 10.2 10*3/uL (ref 4.0–10.5)
nRBC: 0 % (ref 0.0–0.2)

## 2019-06-15 LAB — COMPREHENSIVE METABOLIC PANEL
ALT: 16 U/L (ref 0–44)
AST: 18 U/L (ref 15–41)
Albumin: 3 g/dL — ABNORMAL LOW (ref 3.5–5.0)
Alkaline Phosphatase: 52 U/L (ref 38–126)
Anion gap: 9 (ref 5–15)
BUN: 16 mg/dL (ref 8–23)
CO2: 24 mmol/L (ref 22–32)
Calcium: 8.4 mg/dL — ABNORMAL LOW (ref 8.9–10.3)
Chloride: 107 mmol/L (ref 98–111)
Creatinine, Ser: 0.66 mg/dL (ref 0.44–1.00)
GFR calc Af Amer: >60 mL/min (ref >60)
GFR calc non Af Amer: >60 mL/min (ref >60)
Glucose, Bld: 114 mg/dL — ABNORMAL HIGH (ref 70–99)
Potassium: 3.7 mmol/L (ref 3.5–5.1)
Sodium: 140 mmol/L (ref 135–145)
Total Bilirubin: 0.5 mg/dL (ref 0.3–1.2)
Total Protein: 5.6 g/dL — ABNORMAL LOW (ref 6.5–8.1)

## 2019-06-15 LAB — D-DIMER, QUANTITATIVE: D-Dimer, Quant: 0.79 ug/mL-FEU — ABNORMAL HIGH (ref 0.00–0.50)

## 2019-06-15 LAB — FERRITIN: Ferritin: 155 ng/mL (ref 11–307)

## 2019-06-15 LAB — C-REACTIVE PROTEIN: CRP: 2.2 mg/dL — ABNORMAL HIGH (ref <1.0)

## 2019-06-15 MED ORDER — AMITRIPTYLINE HCL 50 MG PO TABS
50.0000 mg | ORAL_TABLET | Freq: Every day | ORAL | Status: DC
  Administered 2019-06-15: 50 mg via ORAL
  Filled 2019-06-15 (×2): qty 1

## 2019-06-15 MED ORDER — ALPRAZOLAM 0.5 MG PO TABS
0.2500 mg | ORAL_TABLET | Freq: Three times a day (TID) | ORAL | Status: DC | PRN
  Administered 2019-06-15: 0.25 mg via ORAL
  Filled 2019-06-15: qty 1

## 2019-06-15 NOTE — Progress Notes (Signed)
Stacey [name]  [id number] DOB: Feb 03, 1941 DOA: 06/11/2019 PCP: Shon Baton, MD    Brief Narrative:  78 year old with a history of interstitial lung disease on chronic steroids requiring 2 L nasal cannula oxygen at night, HTN, depression, GERD, and RA who tested positive for Covid November 18.  She initially had very mild symptoms but unfortunately progressed and became more short of breath.  COVID-19 specific Treatment: Remdesivir 11/30 > 12/4 Decadron - chronically  Subjective: Reports that she got essentially no sleep last night.  Also relates mild to moderate anxiety.  Denies shortness of breath fevers chills nausea vomiting or abdominal pain.  Assessment & Plan:  Covid pneumonia -acute on chronic hypoxic respiratory failure Continue remdesivir with last dose due 12/4 - wean daytime oxygen as able - continue 2 L nasal cannula at night on schedule as per home regimen -anticipate discharge home 12/4  Recent Labs  Lab 06/11/19 2154 06/11/19 2338 06/13/19 0207 06/14/19 0200 06/15/19 0125  DDIMER  --  1.42* 1.45* 1.09* 0.79*  FERRITIN  --  131 177 167 155  CRP  --  4.7* 7.0* 5.8* 2.2*  ALT 19  --  16 16 16   PROCALCITON  --  <0.10  --   --   --     ILD on chronic steroids - chronic hypoxic respiratory failure Followed by Dr. Chase Caller -chronically on Medrol 4 mg alternating with 2 mg -on Rituxan -Dr. Chase Caller recommended starting nintedanib at the time of her visit 11/11  RA Quiescent presently  History of asthma Quiescent at this time  HTN Well-controlled at this time  DVT prophylaxis: Lovenox Code Status: FULL CODE Family Communication:  Disposition Plan: Expect discharge 12/4  Consultants:  none  Antimicrobials:  None  Objective: Blood pressure (!) 145/71, pulse 69, temperature 97.9 F (36.6 C), temperature source Oral, resp. rate 20, height 5\' 6"  (1.676 m), weight 63.5 kg, SpO2 100 %.  Intake/Output Summary (Last 24 hours) at 06/15/2019 0928 Last  data filed at 06/15/2019 [id number] Gross per 24 hour  Intake 1385.69 ml  Output 300 ml  Net 1085.69 ml   Filed Weights   06/11/19 2053  Weight: 63.5 kg    Examination: General: No acute respiratory distress Lungs: Fine diffuse crackles -no wheezing Cardiovascular: RRR without murmur gallop or rub Abdomen: NT/ND, soft, BS positive, no rebound Extremities: No edema BLE  CBC: Recent Labs  Lab 06/13/19 0207 06/14/19 0200 06/15/19 0125  WBC 9.6 5.5 10.2  NEUTROABS 5.7 4.1 7.9*  HGB 11.7* 11.1* 11.4*  HCT 36.7 35.1* 35.3*  MCV 89.5 89.5 89.8  PLT 342 353 [id number]   Basic Metabolic Panel: Recent Labs  Lab 06/13/19 0207 06/14/19 0200 06/15/19 0125  NA 139 138 140  K 3.9 4.1 3.7  CL 103 105 107  CO2 25 24 24   GLUCOSE 75 127* 114*  BUN 13 15 16   CREATININE 0.67 0.59 0.66  CALCIUM 8.4* 8.5* 8.4*  MG 1.9 2.1  --    GFR: Estimated Creatinine Clearance: 53.4 mL/min (by C-G formula based on SCr of 0.66 mg/dL).  Liver Function Tests: Recent Labs  Lab 06/11/19 2154 06/13/19 0207 06/14/19 0200 06/15/19 0125  AST 31 20 16 18   ALT 19 16 16 16   ALKPHOS 58 56 56 52  BILITOT 1.3* 0.6 0.2* 0.5  PROT 6.1* 5.7* 6.0* 5.6*  ALBUMIN 3.1* 2.9* 3.0* 3.0*    HbA1C: Hgb A1c MFr Bld  Date/Time Value Ref Range Status  08/20/2015 11:10 AM 5.9 (H) 4.8 -  5.6 % Final    Comment:             Pre-diabetes: 5.7 - 6.4          Diabetes: >6.4          Glycemic control for adults with diabetes: <7.0     Recent Results (from the past 240 hour(s))  Blood Culture (routine x 2)     Status: None (Preliminary result)   Collection Time: 06/11/19 11:37 PM   Specimen: BLOOD  Result Value Ref Range Status   Specimen Description   Final    BLOOD RIGHT ANTECUBITAL Performed at [facility] [address], [place], [place] [address]    Special Requests   Final    BOTTLES DRAWN AEROBIC AND ANAEROBIC Blood Culture adequate volume Performed at [facility] [address] [place], [place] [address]    Culture   Final    NO GROWTH 3 DAYS Performed at [facility], [place] [address] [place], [place] [address]    Report Status PENDING  Incomplete  Blood Culture (routine x 2)     Status: None (Preliminary result)   Collection Time: 06/11/19 11:37 PM   Specimen: BLOOD LEFT FOREARM  Result Value Ref Range Status   Specimen Description   Final    BLOOD LEFT FOREARM Performed at [facility] [address], [place], [place] [address]    Special Requests   Final    BOTTLES DRAWN AEROBIC AND ANAEROBIC Blood Culture adequate volume Performed at [facility] [address] [place], [place] [address]    Culture   Final    NO GROWTH 3 DAYS Performed at [facility], [place] [address] [place], [place] [address]    Report Status PENDING  Incomplete     Scheduled Meds: . amitriptyline  25 mg Oral QHS  . dexamethasone (DECADRON) injection  6 mg Intravenous Q24H  . diltiazem  180 mg Oral Daily  . enoxaparin (LOVENOX) injection  40 mg Subcutaneous Q24H  . gabapentin  600 mg Oral QHS  . Ipratropium-Albuterol  1 puff Inhalation QID  . mometasone-formoterol  2 puff Inhalation BID  . pantoprazole  40 mg Oral BID AC  . pravastatin  20 mg Oral QPM  . sodium chloride flush  3 mL Intravenous Q12H  . venlafaxine XR  75 mg Oral QHS  . vitamin C  500 mg Oral Daily  . zinc sulfate  220 mg Oral Daily   Continuous Infusions: . remdesivir 100 mg in NS 250 mL Stopped (06/14/19 1447)     LOS: 4 days   Cherene Altes, MD [facility] Office  [phone] Pager - Text Page per Amion  If 7PM-7AM, please contact night-coverage per Amion 06/15/2019, 9:28 AM

## 2019-06-15 NOTE — Telephone Encounter (Signed)
Since I am currently not at work and am unable to take a look at this, I am routing to pharmacy team for them to help out.  Amber, please advise. Thanks!

## 2019-06-16 ENCOUNTER — Other Ambulatory Visit: Payer: Self-pay | Admitting: *Deleted

## 2019-06-16 MED ORDER — ALPRAZOLAM 0.25 MG PO TABS: 0.2500 mg | ORAL_TABLET | Freq: Three times a day (TID) | ORAL | 0 refills | Status: DC | PRN

## 2019-06-16 MED ORDER — DEXAMETHASONE 6 MG PO TABS: 6.0000 mg | ORAL_TABLET | Freq: Every day | ORAL | 0 refills | Status: AC

## 2019-06-16 MED ORDER — ACETAMINOPHEN 325 MG PO TABS: 650.0000 mg | ORAL_TABLET | Freq: Four times a day (QID) | ORAL | Status: DC | PRN

## 2019-06-16 MED ORDER — METHYLPREDNISOLONE 2 MG PO TABS: 2.0000 mg | ORAL_TABLET | ORAL | Status: DC

## 2019-06-16 MED ORDER — DEXAMETHASONE 6 MG PO TABS
6.0000 mg | ORAL_TABLET | Freq: Every day | ORAL | Status: DC
  Administered 2019-06-16: 6 mg via ORAL
  Filled 2019-06-16: qty 1

## 2019-06-16 MED ORDER — METHYLPREDNISOLONE 4 MG PO TABS: 4.0000 mg | ORAL_TABLET | ORAL | Status: DC

## 2019-06-16 NOTE — Discharge Summary (Signed)
DISCHARGE SUMMARY  Stacey [name]  MR#: [id number]  DOB:November 06, 1940  Date of Admission: 06/11/2019 Date of Discharge: 06/16/2019  Attending Physician:Jeffrey Hennie Duos, MD  Patient's SAY:TKZSW, Stacey Reichmann, MD  Consults: none  Disposition: D/C home   Date of Positive COVID Test: 05/31/2019  Date Quarantine Ends: 06/21/2019 (met criteria for severe illness)  COVID-19 specific Treatment: Remdesivir 11/30 > 12/4 Decadron 11/30 > 12/9 (then resume usual medrol)  Follow-up Appts: Follow-up Information    Shon Baton, MD Follow up in 1 week(s).   Specialty: Internal Medicine Contact information: [place] [address] [phone]        Buford Dresser, MD .   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  She initially had very mild symptoms but unfortunately progressed and became more short of breath.  Hospital Course:  Covid pneumonia -acute on chronic hypoxic respiratory failure Completed her remdesivir course 12/4 -was able to be weaned off oxygen and no longer required supportive oxygen to keep sats at 90% or greater - continue 2 L nasal cannula at night on schedule as per home regimen   ILD on chronic steroids - chronic hypoxic respiratory failure Followed by Dr. Chase Caller -chronically on Medrol 4 mg alternating with 2 mg -on Rituxan -Dr. Chase Caller recommended starting nintedanib at the time of her visit 11/11 - to resume usual medrol dose at completion of  decadron course   RA Quiescent during his hospital stay  History of asthma No difficulty with acute exacerbation during this hospital stay  HTN Well-controlled at this time  Situational anxiety  Well managed with very low dose Xanax - provided patient with very low number prescription at time of discharge as this issue is expected to resolve once she returns home to her familiar environment -informed her that chronic use of this medication is not appropriate for her medical circumstances  Allergies as of 06/16/2019      Reactions   Penicillins Other (See Comments)   Reaction unknown occurred during childhood Has patient had a PCN reaction causing immediate rash, facial/tongue/throat swelling, SOB or lightheadedness with hypotension: Unknown Has patient had a PCN reaction causing severe rash involving mucus membranes or skin necrosis: Unknown Has patient had a PCN reaction that required hospitalization: No Has patient had a PCN reaction occurring within the last 10 years: No If all of the above answers are "NO", then may proceed with Cephalosporin use. Unsur   Remicade [infliximab] Other (See Comments)   Reaction unknown   Sulfa Antibiotics Other (See Comments)   Reaction unknown   Sulfamethoxazole Other (See Comments)   Unsure of reaction As a child not sure      Medication List    TAKE these medications   acetaminophen 325 MG tablet Commonly known as: TYLENOL Take 2 tablets (650 mg total) by mouth every 6 (six) hours as needed for mild pain or headache (fever >/= 101).   albuterol 108 (90 Base) MCG/ACT inhaler Commonly known as: VENTOLIN HFA Inhale 2 puffs into the lungs every 6 (six) hours as needed for  wheezing or shortness of breath.   ALPRAZolam 0.25 MG tablet Commonly known as: XANAX Take 1 tablet (0.25 mg total) by mouth 3 (three) times daily as needed for anxiety.   amitriptyline 25 MG tablet Commonly known as: ELAVIL Take 25 mg by mouth at bedtime.    benzonatate 200 MG capsule Commonly known as: TESSALON Take 1 capsule (200 mg total) by mouth 3 (three) times daily as needed for cough.   beta carotene w/minerals tablet Take 1 tablet by mouth daily.   chlorpheniramine-HYDROcodone 10-8 MG/5ML Suer Commonly known as: TUSSIONEX Take 5 mLs by mouth every 12 (twelve) hours as needed for cough (Not releaved by Delsym  or Robitussin.).   cholecalciferol 1000 units tablet Commonly known as: VITAMIN D Take 1,000 Units by mouth daily.   dexamethasone 6 MG tablet Commonly known as: DECADRON Take 1 tablet (6 mg total) by mouth daily for 5 days. Start taking on: June 17, 2019   dextromethorphan 30 MG/5ML liquid Commonly known as: DELSYM Take 2.5 mLs (15 mg total) by mouth at bedtime.   diltiazem 180 MG 24 hr capsule Commonly known as: CARDIZEM CD Take 180 mg by mouth daily.   gabapentin 300 MG capsule Commonly known as: NEURONTIN Take 600 mg by mouth at bedtime.   ipratropium-albuterol 0.5-2.5 (3) MG/3ML Soln Commonly known as: DUONEB Take 3 mLs by nebulization 3 (three) times daily. What changed:   when to take this  reasons to take this   methylPREDNISolone 4 MG tablet Commonly known as: MEDROL Take 1 tablet (4 mg total) by mouth every other day. Alternate with the 35m dose Start taking on: June 22, 2019 What changed: These instructions start on June 22, 2019. If you are unsure what to do until then, ask your doctor or other care provider.   methylPREDNISolone 2 MG tablet Commonly known as: MEDROL Take 1 tablet (2 mg total) by mouth every other day. Alternate with the 424mdose Start taking on: June 23, 2019 What changed: These instructions start on June 23, 2019. If you are unsure what to do until then, ask your doctor or other care provider.   mometasone-formoterol 100-5 MCG/ACT Aero Commonly known as: DULERA Inhale 2 puffs into the lungs 2 (two) times daily.   pantoprazole 40 MG tablet Commonly  known as: PROTONIX Take 1 tablet (40 mg total) by mouth 2 (two) times daily before a meal.   pravastatin 20 MG tablet Commonly known as: PRAVACHOL Take 1 tablet (20 mg total) by mouth every evening.   Rituxan 100 MG/10ML injection Generic drug: riTUXimab Inject into the vein. [facility] - no more doses for next 6 months as of 09/14/18   venlafaxine XR 75 MG 24 hr capsule Commonly known as: EFFEXOR-XR Take 75 mg by mouth at bedtime.       Day of Discharge BP (!) 150/72 (BP Location: Right Arm)   Pulse 66   Temp 97.6 F (36.4 C) (Oral)   Resp 16   Ht _0  (1.676 m)   Wt 63.5 kg   SpO2 96%   BMI 22.60 kg/m   Physical Exam: General: No acute respiratory distress Lungs: Clear to auscultation bilaterally without wheezes or crackles Cardiovascular: Regular rate and rhythm without murmur gallop or rub normal S1 and S2 Abdomen: Nontender, nondistended, soft, bowel sounds positive, no rebound, no ascites, no appreciable mass Extremities: No significant cyanosis, clubbing, or edema bilateral lower extremities  Basic Metabolic Panel: Recent Labs  Lab 06/11/19  2154 06/13/19 0207 06/14/19 0200 06/15/19 0125  NA 137 139 138 140  K 5.2* 3.9 4.1 3.7  CL 101 103 105 107  CO2 _0 GLUCOSE 101* 75 127* 114*  BUN _1 CREATININE 0.77 0.67 0.59 0.66  CALCIUM 8.3* 8.4* 8.5* 8.4*  MG  --  1.9 2.1  --     Liver Function Tests: Recent Labs  Lab 06/11/19 2154 06/13/19 0207 06/14/19 0200 06/15/19 0125  AST _2 ALT _3 ALKPHOS 58 56 56 52  BILITOT 1.3* 0.6 0.2* 0.5  PROT 6.1* 5.7* 6.0* 5.6*  ALBUMIN 3.1* 2.9* 3.0* 3.0*   CBC: Recent Labs  Lab 06/11/19 2154 06/13/19 0207 06/14/19 0200 06/15/19 0125  WBC 8.8 9.6 5.5 10.2  NEUTROABS 4.6 5.7 4.1 7.9*  HGB 12.5 11.7* 11.1* 11.4*  HCT 39.8 36.7 35.1* 35.3*  MCV 91.5 89.5 89.5 89.8  PLT 337 342 353 347    Recent Results (from the past 240 hour(s))  Blood  Culture (routine x 2)     Status: None (Preliminary result)   Collection Time: 06/11/19 11:37 PM   Specimen: BLOOD  Result Value Ref Range Status   Specimen Description   Final    BLOOD RIGHT ANTECUBITAL Performed at [facility], [place] [address] [place], [place] [address]    Special Requests   Final    BOTTLES DRAWN AEROBIC AND ANAEROBIC Blood Culture adequate volume Performed at [facility] [address], [place], [place] [address]    Culture   Final    NO GROWTH 4 DAYS Performed at [facility], [place] [address], [place], [place] [address]    Report Status PENDING  Incomplete  Blood Culture (routine x 2)     Status: None (Preliminary result)   Collection Time: 06/11/19 11:37 PM   Specimen: BLOOD LEFT FOREARM  Result Value Ref Range Status   Specimen Description   Final    BLOOD LEFT FOREARM Performed at [facility] [address] [place], [place] [address]    Special Requests   Final    BOTTLES DRAWN AEROBIC AND ANAEROBIC Blood Culture adequate volume Performed at [facility] [address], [place],  [address]    Culture   Final    NO GROWTH 4 DAYS Performed at [facility], [place] [address], [place],  [address]    Report Status PENDING  Incomplete     Time spent in discharge (includes decision making & examination of pt): 35 minutes  06/16/2019, 11:38 AM   Cherene Altes, MD [facility] Office  [phone]

## 2019-06-16 NOTE — Telephone Encounter (Signed)
Pt husband calling needing to speak to nurse about medicine he can be reached @ [phone] Ed Grivas.Hillery Hunter

## 2019-06-16 NOTE — Consult Note (Signed)
   Stacey [name]   06/16/2019  Stacey [name] August 23, 1940 [id number]    Patient screened for 27% high risk score for unplanned readmission with 3 hospitalizations in the past 6 months; and to check for potential [facility] Care Management services needed.  Patient had been active with [facility] CM in the past for transition of care and EMMI follow-up calls.  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 Initially had very mild symptoms but unfortunately progressed and became more short of breath. Admitted for Covid pneumonia- acute on chronic hypoxic respiratory failure and treated with Remdesivir 11/30-12/4.  Called to speak with patient over the phone but was unsuccessful even with staff assistance d/t room phone malfunction. Was able to speak to her husband Stacey [name]) via telephone. HIPAA information verified. Husband reports of patient transitioning to home. He mentioned that he was sick with Covid before her wife did.  Explained to Mr. Hosford regarding [place] Management services for chronic disease management.   Patient's husband verbally consents to have [facility] care management coordinator follow her up post hospitalization since she had hospital admissions due to her respiratory conditions lately, being followed by [facility]. Husband reports that patient has been managing her own medications at home from [facility] without difficulty. Husband is able to provide transportation, and helps with her needs at home.   Referral placed to [facility] RN CM for follow up of complex disease management related to Covid pneumonia, asthma,interstitial lung disease.  Of note, [facility] Care Management services does not replace or interfere with any services that are arranged by inpatient  case management or social work.   For questions and additional information, please call:  Gerri Acre A. Karrina Lye, BSN, RN-BC [facility] Liaison Cell: [phone]

## 2019-06-16 NOTE — Discharge Instructions (Signed)
Date of Positive COVID Test: 05/31/2019  Date Quarantine Ends: 06/21/2019    COVID-19 COVID-19 is a respiratory infection that is caused by a virus called severe acute respiratory syndrome coronavirus 2 (SARS-CoV-2). The disease is also known as coronavirus disease or novel coronavirus. In some people, the virus may not cause any symptoms. In others, it may cause a serious infection. The infection can get worse quickly and can lead to complications, such as:  Pneumonia, or infection of the lungs.  Acute respiratory distress syndrome or ARDS. This is fluid build-up in the lungs.  Acute respiratory failure. This is a condition in which there is not enough oxygen passing from the lungs to the body.  Sepsis or septic shock. This is a serious bodily reaction to an infection.  Blood clotting problems.  Secondary infections due to bacteria or fungus. The virus that causes COVID-19 is contagious. This means that it can spread from person to person through droplets from coughs and sneezes (respiratory secretions). What are the causes? This illness is caused by a virus. You may catch the virus by:  Breathing in droplets from an infected person's cough or sneeze.  Touching something, like a table or a doorknob, that was exposed to the virus (contaminated) and then touching your mouth, nose, or eyes. What increases the risk? Risk for infection You are more likely to be infected with this virus if you:  Live in or travel to an area with a COVID-19 outbreak.  Come in contact with a sick person who recently traveled to an area with a COVID-19 outbreak.  Provide care for or live with a person who is infected with COVID-19. Risk for serious illness You are more likely to become seriously ill from the virus if you:  Are 78 years of age or older.  Have a long-term disease that lowers your body's ability to fight infection (immunocompromised).  Live in a nursing home or long-term care  facility.  Have a long-term (chronic) disease such as: ? Chronic lung disease, including chronic obstructive pulmonary disease or asthma ? Heart disease. ? Diabetes. ? Chronic kidney disease. ? Liver disease.  Are obese. What are the signs or symptoms? Symptoms of this condition can range from mild to severe. Symptoms may appear any time from 2 to 14 days after being exposed to the virus. They include:  A fever.  A cough.  Difficulty breathing.  Chills.  Muscle pains.  A sore throat.  Loss of taste or smell. Some people may also have stomach problems, such as nausea, vomiting, or diarrhea. Other people may not have any symptoms of COVID-19. How is this diagnosed? This condition may be diagnosed based on:  Your signs and symptoms, especially if: ? You live in an area with a COVID-19 outbreak. ? You recently traveled to or from an area where the virus is common. ? You provide care for or live with a person who was diagnosed with COVID-19.  A physical exam.  Lab tests, which may include: ? A nasal swab to take a sample of fluid from your nose. ? A throat swab to take a sample of fluid from your throat. ? A sample of mucus from your lungs (sputum). ? Blood tests.  Imaging tests, which may include, X-rays, CT scan, or ultrasound. How is this treated? At present, there is no medicine to treat COVID-19. Medicines that treat other diseases are being used on a trial basis to see if they are effective against COVID-19. Your health  care provider will talk with you about ways to treat your symptoms. For most people, the infection is mild and can be managed at home with rest, fluids, and over-the-counter medicines. Treatment for a serious infection usually takes places in a hospital intensive care unit (ICU). It may include one or more of the following treatments. These treatments are given until your symptoms improve.  Receiving fluids and medicines through an  IV.  Supplemental oxygen. Extra oxygen is given through a tube in the nose, a face mask, or a hood.  Positioning you to lie on your stomach (prone position). This makes it easier for oxygen to get into the lungs.  Continuous positive airway pressure (CPAP) or bi-level positive airway pressure (BPAP) machine. This treatment uses mild air pressure to keep the airways open. A tube that is connected to a motor delivers oxygen to the body.  Ventilator. This treatment moves air into and out of the lungs by using a tube that is placed in your windpipe.  Tracheostomy. This is a procedure to create a hole in the neck so that a breathing tube can be inserted.  Extracorporeal membrane oxygenation (ECMO). This procedure gives the lungs a chance to recover by taking over the functions of the heart and lungs. It supplies oxygen to the body and removes carbon dioxide. Follow these instructions at home: Lifestyle  If you are sick, stay home except to get medical care. Your health care provider will tell you how long to stay home. Call your health care provider before you go for medical care.  Rest at home as told by your health care provider.  Do not use any products that contain nicotine or tobacco, such as cigarettes, e-cigarettes, and chewing tobacco. If you need help quitting, ask your health care provider.  Return to your normal activities as told by your health care provider. Ask your health care provider what activities are safe for you. General instructions  Take over-the-counter and prescription medicines only as told by your health care provider.  Drink enough fluid to keep your urine pale yellow.  Keep all follow-up visits as told by your health care provider. This is important. How is this prevented?  There is no vaccine to help prevent COVID-19 infection. However, there are steps you can take to protect yourself and others from this virus. To protect yourself:   Do not travel to areas  where COVID-19 is a risk. The areas where COVID-19 is reported change often. To identify high-risk areas and travel restrictions, check the CDC travel website: FatFares.com.br  If you live in, or must travel to, an area where COVID-19 is a risk, take precautions to avoid infection. ? Stay away from people who are sick. ? Wash your hands often with soap and water for 20 seconds. If soap and water are not available, use an alcohol-based hand sanitizer. ? Avoid touching your mouth, face, eyes, or nose. ? Avoid going out in public, follow guidance from your state and local health authorities. ? If you must go out in public, wear a cloth face covering or face mask. ? Disinfect objects and surfaces that are frequently touched every day. This may include:  Counters and tables.  Doorknobs and light switches.  Sinks and faucets.  Electronics, such as phones, remote controls, keyboards, computers, and tablets. To protect others: If you have symptoms of COVID-19, take steps to prevent the virus from spreading to others.  If you think you have a COVID-19 infection, contact your health  care provider right away. Tell your health care team that you think you may have a COVID-19 infection.  Stay home. Leave your house only to seek medical care. Do not use public transport.  Do not travel while you are sick.  Wash your hands often with soap and water for 20 seconds. If soap and water are not available, use alcohol-based hand sanitizer.  Stay away from other members of your household. Let healthy household members care for children and pets, if possible. If you have to care for children or pets, wash your hands often and wear a mask. If possible, stay in your own room, separate from others. Use a different bathroom.  Make sure that all people in your household wash their hands well and often.  Cough or sneeze into a tissue or your sleeve or elbow. Do not cough or sneeze into your hand or  into the air.  Wear a cloth face covering or face mask. Where to find more information  Centers for Disease Control and Prevention: PurpleGadgets.be  World Health Organization: https://www.castaneda.info/ Contact a health care provider if:  You live in or have traveled to an area where COVID-19 is a risk and you have symptoms of the infection.  You have had contact with someone who has COVID-19 and you have symptoms of the infection. Get help right away if:  You have trouble breathing.  You have pain or pressure in your chest.  You have confusion.  You have bluish lips and fingernails.  You have difficulty waking from sleep.  You have symptoms that get worse. These symptoms may represent a serious problem that is an emergency. Do not wait to see if the symptoms will go away. Get medical help right away. Call your local emergency services (911 in the [place] Do not drive yourself to the hospital. Let the emergency medical personnel know if you think you have COVID-19. Summary  COVID-19 is a respiratory infection that is caused by a virus. It is also known as coronavirus disease or novel coronavirus. It can cause serious infections, such as pneumonia, acute respiratory distress syndrome, acute respiratory failure, or sepsis.  The virus that causes COVID-19 is contagious. This means that it can spread from person to person through droplets from coughs and sneezes.  You are more likely to develop a serious illness if you are 28 years of age or older, have a weak immunity, live in a nursing home, or have chronic disease.  There is no medicine to treat COVID-19. Your health care provider will talk with you about ways to treat your symptoms.  Take steps to protect yourself and others from infection. Wash your hands often and disinfect objects and surfaces that are frequently touched every day. Stay away from people who are sick and wear a mask if you  are sick. This information is not intended to replace advice given to you by your health care provider. Make sure you discuss any questions you have with your health care provider. Document Released: 08/04/2018 Document Revised: 11/24/2018 Document Reviewed: 08/04/2018 Elsevier Patient Education  2020 [place]: How to Protect Yourself and Others Know how it spreads  There is currently no vaccine to prevent coronavirus disease 2019 (COVID-19).  The best way to prevent illness is to avoid being exposed to this virus.  The virus is thought to spread mainly from person-to-person. ? Between people who are in close contact with one another (within about 6 feet). ? Through respiratory droplets produced  when an infected person coughs, sneezes or talks. ? These droplets can land in the mouths or noses of people who are nearby or possibly be inhaled into the lungs. ? Some recent studies have suggested that COVID-19 may be spread by people who are not showing symptoms. Everyone should Clean your hands often  Wash your hands often with soap and water for at least 20 seconds especially after you have been in a public place, or after blowing your nose, coughing, or sneezing.  If soap and water are not readily available, use a hand sanitizer that contains at least 60% alcohol. Cover all surfaces of your hands and rub them together until they feel dry.  Avoid touching your eyes, nose, and mouth with unwashed hands. Avoid close contact  Stay home if you are sick.  Avoid close contact with people who are sick.  Put distance between yourself and other people. ? Remember that some people without symptoms may be able to spread virus. ? This is especially important for people who are at higher risk of getting very GainPain.com.cy Cover your mouth and nose with a cloth face cover when around others  You could spread  COVID-19 to others even if you do not feel sick.  Everyone should wear a cloth face cover when they have to go out in public, for example to the grocery store or to pick up other necessities. ? Cloth face coverings should not be placed on young children under age 41, anyone who has trouble breathing, or is unconscious, incapacitated or otherwise unable to remove the mask without assistance.  The cloth face cover is meant to protect other people in case you are infected.  Do NOT use a facemask meant for a Dietitian.  Continue to keep about 6 feet between yourself and others. The cloth face cover is not a substitute for social distancing. Cover coughs and sneezes  If you are in a private setting and do not have on your cloth face covering, remember to always cover your mouth and nose with a tissue when you cough or sneeze or use the inside of your elbow.  Throw used tissues in the trash.  Immediately wash your hands with soap and water for at least 20 seconds. If soap and water are not readily available, clean your hands with a hand sanitizer that contains at least 60% alcohol. Clean and disinfect  Clean AND disinfect frequently touched surfaces daily. This includes tables, doorknobs, light switches, countertops, handles, desks, phones, keyboards, toilets, faucets, and sinks. RackRewards.fr  If surfaces are dirty, clean them: Use detergent or soap and water prior to disinfection.  Then, use a household disinfectant. You can see a list of EPA-registered household disinfectants here. michellinders.com 11/15/2018 This information is not intended to replace advice given to you by your health care provider. Make sure you discuss any questions you have with your health care provider. Document Released: 10/25/2018 Document Revised: 11/23/2018 Document Reviewed: 10/25/2018 Elsevier Patient Education  2020 [employer].   COVID-19 Frequently Asked Questions COVID-19 (coronavirus disease) is an infection that is caused by a large family of viruses. Some viruses cause illness in people and others cause illness in animals like camels, cats, and bats. In some cases, the viruses that cause illness in animals can spread to humans. Where did the coronavirus come from? In December 2019, [place] told the [facility]) of several cases of lung disease (human respiratory illness). These cases were linked to an open seafood  and livestock market in the city of [place]. The link to the seafood and livestock market suggests that the virus may have spread from animals to humans. However, since that first outbreak in December, the virus has also been shown to spread from person to person. What is the name of the disease and the virus? Disease name Early on, this disease was called novel coronavirus. This is because scientists determined that the disease was caused by a new (novel) respiratory virus. The World Health Organization [facility]) has now named the disease COVID-19, or coronavirus disease. Virus name The virus that causes the disease is called severe acute respiratory syndrome coronavirus 2 (SARS-CoV-2). More information on disease and virus naming World Health Organization [facility]): www.who.int/emergencies/diseases/novel-coronavirus-2019/technical-guidance/naming-the-coronavirus-disease-(covid-2019)-and-the-virus-that-causes-it Who is at risk for complications from coronavirus disease? Some people may be at higher risk for complications from coronavirus disease. This includes older adults and people who have chronic diseases, such as heart disease, diabetes, and lung disease. If you are at higher risk for complications, take these extra precautions:  Avoid close contact with people who are sick or have a fever or cough. Stay at least 3-6 ft (1-2 m) away from them, if possible.  Wash your hands often with  soap and water for at least 20 seconds.  Avoid touching your face, mouth, nose, or eyes.  Keep supplies on hand at home, such as food, medicine, and cleaning supplies.  Stay home as much as possible.  Avoid social gatherings and travel. How does coronavirus disease spread? The virus that causes coronavirus disease spreads easily from person to person (is contagious). There are also cases of community-spread disease. This means the disease has spread to:  People who have no known contact with other infected people.  People who have not traveled to areas where there are known cases. It appears to spread from one person to another through droplets from coughing or sneezing. Can I get the virus from touching surfaces or objects? There is still a lot that we do not know about the virus that causes coronavirus disease. Scientists are basing a lot of information on what they know about similar viruses, such as:  Viruses cannot generally survive on surfaces for long. They need a human body (host) to survive.  It is more likely that the virus is spread by close contact with people who are sick (direct contact), such as through: ? Shaking hands or hugging. ? Breathing in respiratory droplets that travel through the air. This can happen when an infected person coughs or sneezes on or near other people.  It is less likely that the virus is spread when a person touches a surface or object that has the virus on it (indirect contact). The virus may be able to enter the body if the person touches a surface or object and then touches his or her face, eyes, nose, or mouth. Can a person spread the virus without having symptoms of the disease? It may be possible for the virus to spread before a person has symptoms of the disease, but this is most likely not the main way the virus is spreading. It is more likely for the virus to spread by being in close contact with people who are sick and breathing in the  respiratory droplets of a sick person's cough or sneeze. What are the symptoms of coronavirus disease? Symptoms vary from person to person and can range from mild to severe. Symptoms may include:  Fever.  Cough.  Tiredness, weakness, or fatigue.  Fast breathing or feeling short of breath. These symptoms can appear anywhere from 2 to 14 days after you have been exposed to the virus. If you develop symptoms, call your health care provider. People with severe symptoms may need hospital care. If I am exposed to the virus, how long does it take before symptoms start? Symptoms of coronavirus disease may appear anywhere from 2 to 14 days after a person has been exposed to the virus. If you develop symptoms, call your health care provider. Should I be tested for this virus? Your health care provider will decide whether to test you based on your symptoms, history of exposure, and your risk factors. How does a health care provider test for this virus? Health care providers will collect samples to send for testing. Samples may include:  Taking a swab of fluid from the nose.  Taking fluid from the lungs by having you cough up mucus (sputum) into a sterile cup.  Taking a blood sample.  Taking a stool or urine sample. Is there a treatment or vaccine for this virus? Currently, there is no vaccine to prevent coronavirus disease. Also, there are no medicines like antibiotics or antivirals to treat the virus. A person who becomes sick is given supportive care, which means rest and fluids. A person may also relieve his or her symptoms by using over-the-counter medicines that treat sneezing, coughing, and runny nose. These are the same medicines that a person takes for the common cold. If you develop symptoms, call your health care provider. People with severe symptoms may need hospital care. What can I do to protect myself and my family from this virus?     You can protect yourself and your family by  taking the same actions that you would take to prevent the spread of other viruses. Take the following actions:  Wash your hands often with soap and water for at least 20 seconds. If soap and water are not available, use alcohol-based hand sanitizer.  Avoid touching your face, mouth, nose, or eyes.  Cough or sneeze into a tissue, sleeve, or elbow. Do not cough or sneeze into your hand or the air. ? If you cough or sneeze into a tissue, throw it away immediately and wash your hands.  Disinfect objects and surfaces that you frequently touch every day.  Avoid close contact with people who are sick or have a fever or cough. Stay at least 3-6 ft (1-2 m) away from them, if possible.  Stay home if you are sick, except to get medical care. Call your health care provider before you get medical care.  Make sure your vaccines are up to date. Ask your health care provider what vaccines you need. What should I do if I need to travel? Follow travel recommendations from your local health authority, the CDC, and WHO. Travel information and advice  Centers for Disease Control and Prevention (CDC): BodyEditor.hu  World Health Organization [facility]): ThirdIncome.ca Know the risks and take action to protect your health  You are at higher risk of getting coronavirus disease if you are traveling to areas with an outbreak or if you are exposed to travelers from areas with an outbreak.  Wash your hands often and practice good hygiene to lower the risk of catching or spreading the virus. What should I do if I am sick? General instructions to stop the spread of infection  Wash your hands often with soap and water for at least 20 seconds. If soap and water  are not available, use alcohol-based hand sanitizer.  Cough or sneeze into a tissue, sleeve, or elbow. Do not cough or sneeze into your hand or the air.  If you  cough or sneeze into a tissue, throw it away immediately and wash your hands.  Stay home unless you must get medical care. Call your health care provider or local health authority before you get medical care.  Avoid public areas. Do not take public transportation, if possible.  If you can, wear a mask if you must go out of the house or if you are in close contact with someone who is not sick. Keep your home clean  Disinfect objects and surfaces that are frequently touched every day. This may include: ? Counters and tables. ? Doorknobs and light switches. ? Sinks and faucets. ? Electronics such as phones, remote controls, keyboards, computers, and tablets.  Wash dishes in hot, soapy water or use a dishwasher. Air-dry your dishes.  Wash laundry in hot water. Prevent infecting other household members  Let healthy household members care for children and pets, if possible. If you have to care for children or pets, wash your hands often and wear a mask.  Sleep in a different bedroom or bed, if possible.  Do not share personal items, such as razors, toothbrushes, deodorant, combs, brushes, towels, and washcloths. Where to find more information Centers for Disease Control and Prevention (CDC)  Information and news updates: https://www.butler-gonzalez.com/ World Health Organization [facility])  Information and news updates: MissExecutive.com.ee  Coronavirus health topic: https://www.castaneda.info/  Questions and answers on COVID-19: OpportunityDebt.at  Global tracker: who.sprinklr.com American Academy of Pediatrics (AAP)  Information for families: www.healthychildren.org/English/health-issues/conditions/chest-lungs/Pages/2019-Novel-Coronavirus.aspx The coronavirus situation is changing rapidly. Check your local health authority website or the CDC and [facility] websites for updates and news. When should I contact a  health care provider?  Contact your health care provider if you have symptoms of an infection, such as fever or cough, and you: ? Have been near anyone who is known to have coronavirus disease. ? Have come into contact with a person who is suspected to have coronavirus disease. ? Have traveled outside of the country. When should I get emergency medical care?  Get help right away by calling your local emergency services (911 in the [place] if you have: ? Trouble breathing. ? Pain or pressure in your chest. ? Confusion. ? Blue-tinged lips and fingernails. ? Difficulty waking from sleep. ? Symptoms that get worse. Let the emergency medical personnel know if you think you have coronavirus disease. Summary  A new respiratory virus is spreading from person to person and causing COVID-19 (coronavirus disease).  The virus that causes COVID-19 appears to spread easily. It spreads from one person to another through droplets from coughing or sneezing.  Older adults and those with chronic diseases are at higher risk of disease. If you are at higher risk for complications, take extra precautions.  There is currently no vaccine to prevent coronavirus disease. There are no medicines, such as antibiotics or antivirals, to treat the virus.  You can protect yourself and your family by washing your hands often, avoiding touching your face, and covering your coughs and sneezes. This information is not intended to replace advice given to you by your health care provider. Make sure you discuss any questions you have with your health care provider. Document Released: 10/25/2018 Document Revised: 10/25/2018 Document Reviewed: 10/25/2018 Elsevier Patient Education  [place].

## 2019-06-16 NOTE — Telephone Encounter (Signed)
I had paperwork for the appeal along with the denial letter. All has been faxed to the appeals dept. Spoke with [employer] in regards to this who said she will follow up on Monday.

## 2019-06-16 NOTE — Telephone Encounter (Signed)
Called [employer], to check status of [place] appeal. Per Rep Remo Lipps, no appeal is on file. Provided information to submit.  Patient insurance ID: [id number] Phone: [phone], option #5 Fax: [phone]

## 2019-06-16 NOTE — Care Management Important Message (Signed)
Important Message  Patient Details  Name: Stacey [name] MRN: [id number] Date of Birth: 1940/08/02   Medicare Important Message Given:  Yes - Important Message mailed due to current National Emergency  Verbal consent obtained due to current National Emergency  Relationship to patient: Spouse/Significant Other Contact Name: Willadeen Deloria Call Date: 06/16/19  Time: 1312 Phone: [phone] Outcome: Spoke with contact Important Message mailed to: Patient address on file    [place] 06/16/2019, 1:12 PM

## 2019-06-16 NOTE — Evaluation (Addendum)
Occupational Therapy Evaluation Patient Details Name: Stacey [name] MRN: [id number] DOB: [birth year] Today's Date: 06/16/2019    History of Present Illness Pt is a 78 y.o. female admitted 06/11/19 with worsening SOB; had tested (+) COVID-19 oon 05/31/19. PMH includes ILD (wears 2L O2 at night), HTN, depression, RA.   Clinical Impression   This 78 y/o female presents with the above. PTA pt reports mod independence with ADL and functional mobility with intermittent use of rollator. Pt performing functional mobility using rollator within room this session, standing grooming ADL overall at supervision level; completed toileting and LB ADL with minguard assist. Pt with mild weakness/decreased activity tolerance, but overall tolerating activity and session well today. Pt on RA with activity, when able to obtain good pleth SpO2 maintaining at 88% or greater. Pt reports has good support from spouse who can assist with ADL/iADL PRN after discharge. Educated and reviewed activity progression and energy conservation strategies in anticipation for discharge home (pt hopeful for today). Will continue to follow while she remains in acute setting to progress pt towards her PLOF. Do not anticipate pt will require follow up OT services.   Follow Up Recommendations  No OT follow up;Supervision/Assistance - 24 hour    Equipment Recommendations  None recommended by OT           Precautions / Restrictions Precautions Precautions: Fall Restrictions Weight Bearing Restrictions: No      Mobility Bed Mobility Overal bed mobility: Modified Independent             General bed mobility comments: HOB elevated, no assist required, some light supervision for lines  Transfers Overall transfer level: Needs assistance Equipment used: 4-wheeled walker Transfers: Sit to/from Stand Sit to Stand: Min guard;Supervision         General transfer comment: sit<>stand from EOB and toileting with close  minguard when standing from lower surface height of toilet however no physical assist required.     Balance Overall balance assessment: Needs assistance Sitting-balance support: Feet supported Sitting balance-Leahy Scale: Good     Standing balance support: Bilateral upper extremity supported;During functional activity Standing balance-Leahy Scale: Fair Standing balance comment: able to maintain static standing during grooming ADL with supervision                           ADL either performed or assessed with clinical judgement   ADL Overall ADL's : Needs assistance/impaired Eating/Feeding: Modified independent;Sitting   Grooming: Wash/dry hands;Oral care;Supervision/safety;Standing Grooming Details (indicate cue type and reason): washed hands standing at sink in bathroom, performed oral care standing at sink in bedroom Upper Body Bathing: Set up;Min guard;Sitting   Lower Body Bathing: Min guard;Sit to/from stand Lower Body Bathing Details (indicate cue type and reason): educated to use shower seat initially for increased safety/energy conservation  Upper Body Dressing : Set up;Sitting   Lower Body Dressing: Min guard;Sit to/from stand Lower Body Dressing Details (indicate cue type and reason): donning underwear while seated on toilet in bathroom Toilet Transfer: Supervision/safety;Ambulation;Regular Toilet;Grab bars Toilet Transfer Details (indicate cue type and reason): amb with rollator Toileting- Clothing Manipulation and Hygiene: Min guard;Supervision/safety;Sit to/from stand Toileting - Clothing Manipulation Details (indicate cue type and reason): pt performing pericare/clothing management with minguard-supervision for safety     Functional mobility during ADLs: Supervision/safety(rollator) General ADL Comments: pt with general weakness but overall mobilizing well, mild dyspnea noted with activity but not significant (grossly 1/4)      Vision  Perception     Praxis      Pertinent Vitals/Pain Pain Assessment: No/denies pain     Hand Dominance Right   Extremity/Trunk Assessment Upper Extremity Assessment Upper Extremity Assessment: Overall WFL for tasks assessed   Lower Extremity Assessment Lower Extremity Assessment: Generalized weakness   Cervical / Trunk Assessment Cervical / Trunk Assessment: Normal   Communication Communication Communication: No difficulties   Cognition Arousal/Alertness: Awake/alert Behavior During Therapy: WFL for tasks assessed/performed Overall Cognitive Status: Within Functional Limits for tasks assessed                                     General Comments       Exercises     Shoulder Instructions      Home Living Family/patient expects to be discharged to:: Private residence Living Arrangements: Spouse/significant other Available Help at Discharge: Family Type of Home: House Home Access: Stairs to enter Technical brewer of Steps: 2 Entrance Stairs-Rails: None Home Layout: Two level;Able to live on main level with bedroom/bathroom Alternate Level Stairs-Number of Steps: flight   Bathroom Shower/Tub: Occupational psychologist: Handicapped height     Home Equipment: Shower seat - built in;Cane - single point;Walker - 4 wheels;Bedside commode          Prior Functioning/Environment Level of Independence: Independent with assistive device(s)        Comments: using rollator for mobility        OT Problem List: Decreased strength;Decreased activity tolerance;Impaired balance (sitting and/or standing);Cardiopulmonary status limiting activity;Decreased knowledge of use of DME or AE      OT Treatment/Interventions: Self-care/ADL training;Therapeutic exercise;Energy conservation;DME and/or AE instruction;Therapeutic activities;Patient/family education;Balance training    OT Goals(Current goals can be found in the care plan section) Acute  Rehab OT Goals Patient Stated Goal: home today OT Goal Formulation: With patient Time For Goal Achievement: 06/30/19 Potential to Achieve Goals: Good  OT Frequency: Min 3X/week   Barriers to D/C:            Co-evaluation              AM-PAC OT "6 Clicks" Daily Activity     Outcome Measure Help from another person eating meals?: None Help from another person taking care of personal grooming?: None Help from another person toileting, which includes using toliet, bedpan, or urinal?: A Little Help from another person bathing (including washing, rinsing, drying)?: A Little Help from another person to put on and taking off regular upper body clothing?: None Help from another person to put on and taking off regular lower body clothing?: A Little 6 Click Score: 21   End of Session Equipment Utilized During Treatment: Other (comment)(rollator) Nurse Communication: Mobility status  Activity Tolerance: Patient tolerated treatment well Patient left: in chair;with call bell/phone within reach;with nursing/sitter in room  OT Visit Diagnosis: Muscle weakness (generalized) (M62.81);Unsteadiness on feet (R26.81)                Time: [id number] OT Time Calculation (min): 43 min Charges:  OT General Charges $OT Visit: 1 Visit OT Evaluation $OT Eval Moderate Complexity: 1 Mod OT Treatments $Self Care/Home Management : 23-37 mins  Lou Cal, OT Supplemental Rehabilitation Services Pager [phone] Office [phone]   Raymondo Band 06/16/2019, 1:24 PM

## 2019-06-17 ENCOUNTER — Other Ambulatory Visit: Payer: Self-pay | Admitting: Internal Medicine

## 2019-06-17 LAB — CULTURE, BLOOD (ROUTINE X 2)
Culture: NO GROWTH
Culture: NO GROWTH
Special Requests: ADEQUATE
Special Requests: ADEQUATE

## 2019-06-19 ENCOUNTER — Telehealth: Payer: Self-pay | Admitting: Internal Medicine

## 2019-06-19 NOTE — Telephone Encounter (Signed)
Forwarding to [place] to f/u on, thanks

## 2019-06-19 NOTE — Telephone Encounter (Signed)
Called and spoke with pt's husband Festus Aloe letting him know that I received the letter and that we have made the visit a televisit. Stated to him that we have our pharmacy team working on trying to get her [place] approved and he verbalized understanding. Nothing further needed.

## 2019-06-20 ENCOUNTER — Other Ambulatory Visit: Payer: Self-pay | Admitting: *Deleted

## 2019-06-20 NOTE — Patient Outreach (Signed)
[place] [facility]) Care Management  06/20/2019  ARELI WEIR [birth year] [id number]    Telephone Assessment-Successful-Declined   RN spoke with pt today and introduced the [facility] services. RN further inquired on pt's ongoing medical issues and recent discharged from the hospital. Pt reports she is doing well with no major issues. States she has a pending appointment with her pulmonologist in the AM telephonically. Denies any issues with he asthma and COPD staying in the GREEN zone with again no symptoms. States she is not using her home O2 at this time and continues to do well. Pt reprors having all her medication and a good support system from her spouse Festus Aloe).   RN again attempted enrollment with prevention measures and offered to follow up quarterly with no potential needs however pt adamant and refused any enrollment program currently with [facility] however appreciative but declined services.  Case will be closure and team updated accordingly.  Raina Mina, RN Care Management Coordinator [place] Office [phone]

## 2019-06-21 ENCOUNTER — Other Ambulatory Visit: Payer: Self-pay

## 2019-06-21 ENCOUNTER — Encounter: Payer: Self-pay | Admitting: Internal Medicine

## 2019-06-21 ENCOUNTER — Ambulatory Visit (INDEPENDENT_AMBULATORY_CARE_PROVIDER_SITE_OTHER): Payer: Medicare Other | Admitting: Internal Medicine

## 2019-06-21 DIAGNOSIS — J45909 Unspecified asthma, uncomplicated: Secondary | ICD-10-CM

## 2019-06-21 DIAGNOSIS — Z5181 Encounter for therapeutic drug level monitoring: Secondary | ICD-10-CM

## 2019-06-21 DIAGNOSIS — M359 Systemic involvement of connective tissue, unspecified: Secondary | ICD-10-CM

## 2019-06-21 DIAGNOSIS — Z7189 Other specified counseling: Secondary | ICD-10-CM

## 2019-06-21 DIAGNOSIS — J8489 Other specified interstitial pulmonary diseases: Secondary | ICD-10-CM

## 2019-06-21 DIAGNOSIS — I2510 Atherosclerotic heart disease of native coronary artery without angina pectoris: Secondary | ICD-10-CM

## 2019-06-21 NOTE — Progress Notes (Signed)
Brief patient profile:  46 yowf  never smoker with allergies/inhalers as child outgrew by [employer] then  RA since around 2000  Prednisone  x decades and prev eval by Dr Joya Gaskins around 2004 for sob resolved s maint rx and referred 05/26/2013 by Dr Shelia Media for bronchitis and abn cxr   History of Present Illness  05/26/2013 1st [facility] office visit/ Wert cc June 2014 dx pna  In [place] and remicade stopped and 100% better and placed arencia in September 2014  then abruptly worse first week in November with cough green sputum s nasal symptoms, fever low grade and no cp or cough and completely recovered prior to [place] does not recall abx but issue is why keeps getting sick and abn CT Chest (see below).   Arthritis symptoms well controlled at present on Rx for RA rec Nexium 40 mg Take 30-60 min before first meal of the day and add pepcid 20 mg one at bedtime whenever coughing.     10/19/2017  f/u ov/Wert re:  RA lung dz  Chief Complaint  Patient presents with   Follow-up    Cough is much improved, but has not resolved yet. Cough is non prod. She has not had to use her neb.   Dyspnea:  Not limited by breathing from desired activities  But some doe x steps Cough: daytime > noct dry  Sleep: fine  SABA use:  No saba Medrol 4 mg a/w 17m per day/ ok control of arthritis  rec Start back on gabapentin up to 300 mg each am  in addition to the the two at bedtime  If not better increase the medrol to 8 mg daily until bettter then taper back to where you      01/10/2018  f/u ov/Wert re:  RA  Lung dz Medrol  4 mg  One alternating with a half Chief Complaint  Patient presents with   Follow-up    PFT's done. Her breathing has been gradually worsening since the last visit. She has occ cough- non prod.   Dyspnea gradually worse since last ov:  MMRC1 =  MMRC3 = can't walk 100 yards even at a slow pace at a flat grade s stopping due to sob    Gradually x 3 m / more fatigue / no change in  arthritis  Cough: not an issue rec Protonix 40 mg Take 30-60 min before first meal of the day  GERD diet   01/17/2018 acute extended ov/Wert re: cough on medrol 4 mg  One a/w one half  Chief Complaint  Patient presents with   Acute Visit    started coughing 01/11/18- occ prod with minimal green sputum.  She states also wheezing and having increased SOB.    abruptly worse 01/11/18 with severe 24/7 coughing >>  prod min green mucus esp in am/ assoc with subjective wheeze and did not follow previous contingencies re flutter / saba/ increase medrol and admits she does not rember those written instructions nor how to use the neb provided .  No fever/ comfortable at rest sitting  rec For cough > mucinex dm 1200 mg every 12 hours and cough into the flutter valve as much as possible  Doxycycline 100 mg twice daily x 10 days with glass of water Medrol 436mx 2 now and take 2  daily until cough is better then 1 daily x 5 days and then resume the previous dose  Shortness of breath/ wheezing/  still coughing > albuterol neb every 4 hours as needed      01/20/2018 acute extended ov/Wert re: refractory cough and sob 01/11/18 Chief Complaint  Patient presents with   Acute Visit    she is not feeling better, coughing , very SOB, wheezing  mucus now clear/scant  on doxy/ neb machine not working (tube would not plug into the side s adequate force and she was not capable of applying it due to RA hands. Cough/ wheeze/ sob 24/7 / flutter not helping/ can't lie down at hs   rec While coughing protonix 40 mg Take 30- 60 min before your first and last meals of the day  Shortness of breath/ wheezing/ still coughing > albuterol neb every 4 hours as needed  Depomedrol 120 mg IM and medrol 32 mg daily x 2 days,  then 16 mg x 3 days,  Then 8 mg x 4 days , then resume the 4 mg daily  For severe cough > tylenol 3# one every 4 hours if needed  Go to ER if condition worsens on above plan       Date of admission:  01/22/2018             Date of discharge: 01/27/2018   History of present illness: As per the H and P dictated on admission, "[name] a78 y.o.female,w Rheumatoid arthritis, ILD Asthma, apparently c/o increase in dyspnea this evening. Dry cough. Denies fever, chills, cp, palp, N/v, diarrhea, brbpr, black stool. Pt notes recently being given steroid injection in office as well as being placed on doxycycline. This might have helped slightly but pt worse Hospital Course:  Summary of her active problems in the hospital is as following. 1 dyspnea/hypoxemia/ILD Concerned that likely GERD Is causing ILD. Patient with cough.  Patient with complaints of awakening with cough and also with oral intake which is slightly improved since 01/24/2018. assessed by speech therapy and speech therapy raising concern of esophageal component but no signs of aspiration.  2D echo with a EF of 55 to 60% with no wall motion abnormalities, grade 1 diastolic dysfunction.  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 Pulmonary felt that the patient should be on scheduled Reglan. I have placed the patient on scheduled potassium before sleep. Continue steroids on discharge continue Mucinex and Claritin as well as inhalers. Patient will follow-up with pulmonary outpatient  2. Gastroesophageal reflux disease Continue PPI and H2 blocker.I changed PPI to [facility].  3. Rheumatoid arthritis Outpatient follow-up.   4. Anxiety Continue Effexor.    All other chronic medical condition were stable during the hospitalization.  Patient was ambulatory without any assistance. On the day of the discharge the patient's vitals were stable , and no other acute medical condition were reported by patient. the patient was felt safe to be discharge at home with family.  Consultants: PCCM  Procedures: Echocardiogram      03/21/2018  f/u ov/Wert re:   S/p admit was  transiently better  and downhill since Labor day on medrol 4 mg daily  Last orencia on Sept 4th 2019  Chief Complaint  Patient presents with   Acute Visit    Per patient, she has had a dry cough since July 2019. She has been wheezing as well. Increased fatigue. Body aches. Denies any fever or chills.   Dyspnea:  MMRC4  = sob if tries to leave home or while getting dressed   Cough: harsh/ hacking mostly dry/ has flutter not using  SABA use: not much better with rx   No obvious day to day or daytime variability or assoc excess/ purulent sputum or mucus plugs or hemoptysis or cp or chest tightness, subjective wheeze or overt sinus or hb symptoms.     Also denies any obvious fluctuation of symptoms with weather or environmental changes or other aggravating or alleviating factors except as outlined above   No unusual exposure hx or h/o childhood pna/ asthma or knowledge of premature birth.   INpatient consult 03/26/18 78 year old with rheumatoid arthoritis.At baseline the patient lives at home with her husband and is independent of ADLs.  Has been on many immune suppressants over decades and curently on orencia x 4 year and prednisone. Does not recollect being on bactrim/dapsone. Chart mentions BOOP/MAI in 2001 but she denies this. 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 Got admitted. Rx with steroids. Per Notes - clnical suspicion of  arytenoid inflmmation related wheeze noticed (she also reports asthma NOS). Follolwup with ENT recommended (but not seen one as yet). She also appears to have passed swallow with rec for regular diet with thin liquids but did to have mild eso stricture and reflux during testing . PFT shows 10% FVC decline for first ime. CT chest at this tme (aug 2019) showed new rLL infiltrate. ECHO July 2019 without evicence of  elevated PASP and saw cards [facility] June 2019 and was considered to have worsenin dyspnea due to [facility] issues (reports stress test at [facility] that was normal but I cannot see it)  She reports after discharge she got better but in last several weeks has deteriorated with cough and dyspnea. There is new hypoxemia (currently RA with nail polish and poor circulation - 89% pulse ox) needing 2L [place]. Per [facility] improved with steroids and abx. CTA 03/24/2018 => shows that RLL inifltrate has improved . Other chronic ILD changes + and small  Hiatal hernia + witthout change.  Review of lab work does show eosinophilia at time of admision  EVENTS 03/21/18 - IgE -5, blood allegy panel - negative, 03/23/2018 - - admit . HIGH EOS 2300, ESR 48, BNP 89 , HIV neg 9/12- PCT negative, RVP negative 9/14 -  PCT < 0.1, Urine strep - negative, MRSA PCR - positive. IgE - normal 4, Blood allergy panel repeat - negative 03/26/2018 - leading consideration for airway (BO in RA +/- asthma) related flare either due to MRSA bronchitis or clinical suspicion of arytenoid inflmmation +/- GERD relatd flare (she has small hiatal hernia)  +/- ? Dysphagia  up causing mild hypoxemia acute resp failure, wheeze . Allergy and IgE blood work negative thought. Patient reports being better but says she is choking on drinkin water [facility] MD says wheeze improved significantly with steroids.  RN says was down to RA yesterday evening but needed 1L [place] at sleep. Today -Room air at rest 94% and desaturated to 86% walking 60 feet 03/27/2018  - better. Off o2 at rest. STill coughs with water and when lies down.  Husband at bedside. Both requesting ILD clinic followup . Desaturated t 79% walking 90 feet.   OV 04/12/2018  Subjective:  Patient ID: Stacey [name], female , DOB: 1940/10/26 , age 38 y.o. , MRN: [id number] , ADDRESS: [place] [address]   04/12/2018 -  Chief Complaint  Patient presents with   Consult    Pt is a former  [facility] pt.  Pt denies any current complaints of cough, SOB, or CP but states the cough she originally had ended her up in the hosp 9/11-9/17 with dx acute respiratory failure. Pt does wear 2pulse with exertion and also wears 2L continuous when at home.     HPI TRENIYAH LYNN 78 y.o. -presents for follow-up to the ILD clinic.  She is known to have rheumatoid arthritis with ILD changes.  She had been followed by Dr. Christinia Gully.  However in July 2019 in September 2019 she has had 2 admissions to the hospital with respiratory distress and hypoxemic respiratory failure.  In the first 1 that seem to be right lower lobe infiltrate and then she improved from it but in the second 1 even though the right lower lobe infiltrates were better she still was hypoxemic.  Acid reflux and dysphagia was considered a possible etiology but she passed swallow study 2 times.  They thought she had some reflux.  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  At the same time it is not clear if the ILD is progressive based on pulmonary function testing below   At this point in time she tells me that she is getting home physical therapy.  Her fatigue is improving but it is not fully resolved.  She was discharged on continuous oxygen which she is using.  However she is feeling less short of breath.  Today in fact when we turned her oxygen off and walked her she did not desaturate and this is a significant improvement.  She is on monthly Orencia through the [facility] rheumatologist Dr. Eda Paschal.  At this point in time she is put the Orencia on hold.  She told me that she is been on Orencia for 4 years and never had a respiratory exacerbation still recently x 2.  Although before going on Orencia she had pneumonia while on Remicade and the Remicade.  In terms of her rheumatoid arthritis she hardly has any pain.  Her joint architecture is fairly well-preserved because of various  immunomodulators over time.  She says that she was on Remicade for years and when she stopped it for 8 weeks before the switch to [place] she never really had a relapse in her rheumatoid arthritis.  She is largely pain and stiffness free.  She believes she can go without  her Orencia for a while.  Review of the literature shows greater than 10% chance of a respiratory infection especially COPD exacerbation.  Although the time frame for this is unclear.       OV 06/07/2018  Subjective:  Patient ID: Stacey [name], female , DOB: September 16, 1940 , age 57 y.o. , MRN: [id number] , ADDRESS: [place] [id number]   06/07/2018 -   Chief Complaint  Patient presents with   Follow-up    ILD, PFT done today, some wheezing and coughing but better tha before   Rheumatoid arthritis ILD and asthma/obstructive lung disease phenotype on Dulera  HPI CAMILLIA MARCY 78 y.o. -presents for routine follow-up with her husband.  She is here to follow-up with [facility] Dr. Stann Mainland.  She plans to do this in December 2019.  She continues to be off Orencia.  Her joints are slowly getting stiff again.  She believes that she will need to be back on immunosuppression agent again.  She  currently continues Medrol 4 mg alternating with 2 mg.  This for her rheumatoid arthritis.  In terms of her joints she continues on Medrol 4 mg alternating with 2 mg but not on any other immunosuppression agent.  Overall she is been stable but for the last 2 weeks has had green sputum and wheezing and chest congestion and cough.  She recently visited her husband who was hospitalized and walking the long hallways at [facility] made a short of breath but she thinks this is probably baseline for her.  There are no other new issues.  She did have spirometry and DLCO and this shows a decline compared to September 2019 and a similar to July 2019.  It is documented below.  This is probably reflective of a  flareup   OV 07/21/2018  Subjective:  Patient ID: Stacey [name], female , DOB: 30-Mar-1941 , age 53 y.o. , MRN: [id number] , ADDRESS: [place] [address]   07/21/2018 -   Chief Complaint  Patient presents with   Follow-up    Pt states she has been doing well since last visit. States she is about to begin Rituxan with [facility]. Pt still becomes SOB with exertion. Denies any complaints of cough or CP.   Rheumatoid arthritis ILD and asthma/obstructive lung disease phenotype on Dulera  HPI NIESHIA LARMON 78 y.o. -presents for follow-up of the above.  Last seen just before Thanksgiving 2019.  In the interim overall stable although on June 23, 2018 she climbs a steep flight of stairs which is unusual exertion for her and she became very dyspneic.  Following day saw Dr. Stann Mainland at [facility]ology and was given Z-Pak and prednisone and started feeling better.  Although it is not fully clear to me she had fever and bronchitic symptoms.  I reviewed Dr. Stann Mainland note.  Dr. Stann Mainland is decided to start Rituxan for rheumatoid arthritis.  She is only having some minimal joint pain at this point.  She is off Orencia and continues to be off [place].  She did have some blood work with [place] before starting Rituxan.  She is due to see Dr. Stann Mainland within the next week and start her Rituxan.  Her liver function test July 18, 2017 is normal hemoglobin is normal.  CRP is also normal.  We did spirometry and walking desaturation test.  These show improvement compared to before and these are documented below.  Currently she not using nighttime or daytime oxygen.  She is wondering if she could switch rheumatology care to [facility].  This is because while she likes [employer] she is getting older and more frail and feels some body local would be of help.  I have sent a message to Dr. Estanislado Pandy inquiring.  Certainly we can help her with Rituxan infusions at [place] [facility]  if needed.  She will check on this with her [facility] rheumatologist.       OV 03/27/2019  Subjective:  Patient ID: Stacey [name], female , DOB: 02-25-1941 , age 35 y.o. , MRN: [id number] , ADDRESS: [place] [address]  Rheumatoid arthritis ILD and asthma/obstructive lung disease phenotype on [facility]   03/27/2019 -  Rourine fu   HPI PRISCILLA KIRSTEIN 78 y.o. -presents for the above.  Last seen in January 2020.  After that she has seen Dr. Stann Mainland rheumatology at [facility].  She is now getting Rituxan 2 doses every 6 months.  She says  this is helped her joints and her stiffness.  She is a little bit more mobile than usual.  However in terms of her respiratory status she continues to have episodic cough.  In June 2020 she again got hypoxemic and got admitted.  Since then she has had episodic cough.  She had a respiratory exacerbation in June 2020 for the admission she got steroids.  This seemed to help.  She is also on a higher dose Dulera right now.  In terms of her cough this seems to be her biggest problem.  She seems to be on Dulera, Singulair scheduled with also Tessalon and Delsym and DuoNeb.  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  Her last high-resolution CT scan of the chest was 1 year ago.  She says the dyspnea itself is not worse.  She is currently not using oxygen.  On exam she did have some wheezing.  Currently she has white and brown sputum but this is baseline.  In terms of a COVID wrist she has been tested recently couple of times and this is been negative.  She is isolating well.  She wanted to know COVID prevention activities and risk status and masking strategies.      OV 05/24/2019  Subjective:  Patient ID: Stacey [name], female , DOB: [birth year] , age 11 y.o. , MRN: [id number] , ADDRESS: [place]  [id number]   05/24/2019 -   Chief Complaint  Patient presents with   Follow-up    Pt was recently in the hosp due to ILD. Pt states that she has been better since being out of the hosp.   Rheumatoid arthritis ILD and asthma/obstructive lung disease phenotype on Dulera/Singulair  Post herpetic neuralgia - on elavil, gabapentin, effexor  HPI TRISHA KEN 78 y.o. -returns for follow-up.  At the last visit approximately 2 months ago she was reporting worsening cough following an admission in summer 2020.  Her pulmonary function test suggested worsening ILD status.  Therefore we requested a high-resolution CT chest which she did in October 2020.  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  However in the interim after the CT scan was done towards the end of October 2020 she developed worsening of her cough over 2 weeks and also associated shortness of breath but significantly the cough is much worse.  She ended up getting admitted to the hospital.  There was some hypoxemia.  By this time she had finished a ENT evaluation that did not show any involvement of the arytenoids.  Pulmonary was consulted.  She was given a prednisone burst which she just finished I believe yesterday.  She is back on her baseline Medrol.  And she is feeling better.  Her oxygen status is improved although she is using oxygen at night now.  She is really frustrated with these recurrent flareups and these admissions which ended up with her having a wheeze and also hypoxemia.  Currently she is on her baseline Medrol for rheumatoid arthritis associated with Dulera and Singulair.  She is also on losartan for blood pressure.  Her walking desaturation test is slightly worse than baseline.  She has a GI consult pending because of the recurrent episodes of cough and flareups.  We went over exposure history.  We used interstitial lung disease questionnaire for the exposure history.  Specifically she denies any  electronic cigarette use of marijuana use of cocaine use or any IV drug abuse.  [place] the last 14 years.  Asked extensive questions about the home environment it is positive for nebulizer use but the nebulizer does not have mildew or mold in it.  Otherwise no organic antigen exposure.  The house is not damp.  There is no mold or mildew in the shower curtain.  There is no humidifier use no steam iron use.  No Jacuzzi use.  No misting [place] outside to inside the house.  No pet birds.  No pet gerbils no feather pillows.  There is no mold in the [facility] duct.  She does not do any gardening.  Does not use wind instruments.  Also 122 question occupational history elicited and essentially negative.  The other issue is that she has polypharmacy.  She is asking for my help in reducing her medications.  She is on 3 medications for postherpetic neuralgia.  She is on losartan      OV 06/21/2019  Subjective:  Patient ID: Stacey [name], female , DOB: 1940/10/26 , age 5 y.o. , MRN: [id number] , ADDRESS: [place] [id number]   06/21/2019 -   Chief Complaint  Patient presents with   televisit    hosp 11/29-12/4 due to covid with pna. pt said that she is doing okay after recent hosp but states she has no energy.   Rheumatoid arthritis with Joints/ILD- Rituxan/steroids ([facility]). Planned Ofev start in Nov 2020 but did not start as of dec 2020  [place]/obstructive lung disease phenotype on Dulera/Singulair  Post herpetic neuralgia - on elavil, gabapentin, effexor  Coronary Artery Calcification - 1 vessel - cardiology referral done  HPI - patient identified with 2 person identifiers  AUREA ARONOV 78 y.o. - last visit 05/24/2019. Diagnosed with covid-19 on 05/31/2019. Admitted 06/11/2019  - 06/16/2019. Quarantine ends 06/21/2019 today. Rx with Remdesviri and Decadron. Husband also on phone. Questions  1. Quarantine  ends - from 06/22/19_0 and she is not contagious and likely resistant to reinfection to covid for another few months  2. She is on 10 day dexamethasone for covid - today is last day for it  3. She is on dulera. Hospital gave combivent and she does not want do combivent - this is fine  4. Ofev for ILD - not started it yet . She had questions about side effects. Explained GI and LFT monirtong. SHe wanted to wait till Jan 2021 and start it . I am ok with that.      SYMPTOM SCALE - ILD 03/27/2019  05/24/2019   O2 use ra   Shortness of Breath 0 -> 5 scale with 5 being worst (score 6 If unable to do)   At rest 0 2  Simple tasks - showers, clothes change, eating, shaving 0 3  Household (dishes, doing bed, laundry) 2 4  Shopping 2 3  Walking level at own pace 2 3  Walking keeping up with others of same age 25 4  Walking up Stairs 3 3  Walking up [place] 3 4  Total (40 - 48) Dyspnea Score 15 26  How bad is your cough? 2 2  How bad is your fatigue 3 3.5    Results for DEAUNA, YAW (MRN [id number]) as of 03/27/2019 09:43  Ref. Range 07/17/2013 09:53 02/07/2016 13:09 01/11/2017 11:05 01/10/2018 10:13 05/17/2018 14:24 06/07/2018  10:57 07/21/2018 10:53 03/27/2019 09:02  FVC-Pre Latest Units: L 2.13 2.16 2.14 1.93 1.99 1.81 2.18 1.79  FVC-%Pred-Pre Latest Units: % 68 71 71 65 75 68 77 64   Results for MILAN, CLARE (MRN [id number]) as of 03/27/2019 09:43  Ref. Range 07/17/2013 09:53 02/07/2016 13:09 01/11/2017 11:05 01/10/2018 10:13 05/17/2018 14:24 06/07/2018 10:57 07/21/2018 10:53 03/27/2019 09:02  DLCO unc Latest Units: ml/min/mmHg 18.85 18.25 16.50 16.09 16.58 14.06  15.04  DLCO unc % pred Latest Units: % 69 67 61 59 70 59  77    Simple office walk  feet x  3 laps goal with forehead probe 04/12/2018  07/21/2018  05/24/2019   O2 used Room air - off o2 x 10 min Room air  Room air  Number laps completed 3 x 185 feet 3 x 250 feet   Comments about pace normal normal   Resting Pulse Ox/HR 99% and 72/min 97%  and 74/min 97% and 81/min  Final Pulse Ox/HR 98% and 95/min 96% and 96/min 94% and 107/min  Desaturated </= 88% no no no  Desaturated <= 3% points no no Yes, 3 pints  Got Tachycardic >/= 90/min yes yes yes  Symptoms at end of test No complaint Mild dyspnea Mod dyspnea  Miscellaneous comments improed from hospital Same v improved woprse    IMPRESSION: HRCT OCt 2020 1. 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 Findings have progressed since 2017 and 2019 high-resolution chest CT studies. 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 Findings are consistent with UIP per consensus guidelines: Diagnosis of Idiopathic Pulmonary Fibrosis: An Official ATS/ERS/JRS/ALAT Clinical Practice Guideline. [place], Iss 5, [phone], Mar 13 2017. 2. 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 4. Small hiatal hernia.  Aortic Atherosclerosis (ICD10-I70.0).   Electronically Signed   By: Ilona Sorrel M.D.   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   reports that she has never smoked. She has never used smokeless tobacco.  Past Surgical History:  Procedure Laterality Date   CHOLECYSTECTOMY  2011   WISDOM TOOTH EXTRACTION      Allergies  Allergen Reactions   Penicillins Other (See Comments)    Reaction unknown occurred during childhood Has patient had a PCN reaction causing immediate rash, facial/tongue/throat swelling, SOB or  lightheadedness with hypotension: Unknown Has patient had a PCN reaction causing severe rash involving mucus membranes or skin necrosis: Unknown Has patient had a PCN reaction that required hospitalization: No Has patient had a PCN reaction occurring within the last 10 years: No If all of the above answers are "NO", then may proceed with Cephalosporin use.  Unsur   Remicade [Infliximab] Other (See Comments)    Reaction unknown   Sulfa Antibiotics Other (See Comments)    Reaction unknown   Sulfamethoxazole Other (See Comments)    Unsure of reaction As a child not sure     Immunization History  Administered Date(s) Administered   Influenza Split 04/12/2013, 04/12/2014, 03/14/2015, 04/23/2016   Influenza, High Dose Seasonal PF 04/12/2017, 03/24/2018, 04/13/2019   Influenza-Unspecified 04/03/2013, 04/22/2017  Pneumococcal Conjugate-13 04/03/2013, 09/28/2014   Pneumococcal Polysaccharide-23 07/13/2012, 07/24/2013   Td 01/11/2018   Tdap 12/10/2017    Family History  Problem Relation Age of Onset   Asthma Mother    Anemia Mother    Polymyalgia rheumatica Mother    COPD Father    Pulmonary fibrosis Father    Breast cancer Maternal Grandmother 88     Current Outpatient Medications:    acetaminophen (TYLENOL) 325 MG tablet, Take 2 tablets (650 mg total) by mouth every 6 (six) hours as needed for mild pain or headache (fever >/= 101)., Disp:  , Rfl:    albuterol (VENTOLIN HFA) 108 (90 Base) MCG/ACT inhaler, Inhale 2 puffs into the lungs every 6 (six) hours as needed for wheezing or shortness of breath., Disp: 18 g, Rfl: 0   ALPRAZolam (XANAX) 0.25 MG tablet, Take 1 tablet (0.25 mg total) by mouth 3 (three) times daily as needed for anxiety., Disp: 20 tablet, Rfl: 0   amitriptyline (ELAVIL) 25 MG tablet, Take 25 mg by mouth at bedtime., Disp: , Rfl: 0   benzonatate (TESSALON) 200 MG capsule, Take 1 capsule (200 mg total) by mouth 3 (three) times daily as needed  for cough., Disp: 20 capsule, Rfl: 2   beta carotene w/minerals (OCUVITE) tablet, Take 1 tablet by mouth daily., Disp: , Rfl:    chlorpheniramine-HYDROcodone (TUSSIONEX) 10-8 MG/5ML SUER, Take 5 mLs by mouth every 12 (twelve) hours as needed for cough (Not releaved by Delsym  or Robitussin.)., Disp: 140 mL, Rfl: 0   cholecalciferol (VITAMIN D) 1000 UNITS tablet, Take 1,000 Units by mouth daily., Disp: , Rfl:    dexamethasone (DECADRON) 6 MG tablet, Take 1 tablet (6 mg total) by mouth daily for 5 days., Disp: 5 tablet, Rfl: 0   dextromethorphan (DELSYM) 30 MG/5ML liquid, Take 2.5 mLs (15 mg total) by mouth at bedtime., Disp: 89 mL, Rfl: 0   diltiazem (CARDIZEM CD) 180 MG 24 hr capsule, Take 180 mg by mouth daily., Disp: , Rfl:    gabapentin (NEURONTIN) 300 MG capsule, Take 600 mg by mouth at bedtime., Disp: , Rfl:    ipratropium-albuterol (DUONEB) 0.5-2.5 (3) MG/3ML SOLN, Take 3 mLs by nebulization 3 (three) times daily. (Patient taking differently: Take 3 mLs by nebulization every 6 (six) hours as needed (sob). ), Disp: 360 mL, Rfl: 0   mometasone-formoterol (DULERA) 100-5 MCG/ACT AERO, Inhale 2 puffs into the lungs 2 (two) times daily., Disp: 13 g, Rfl: 0   pantoprazole (PROTONIX) 40 MG tablet, Take 1 tablet (40 mg total) by mouth 2 (two) times daily before a meal., Disp: 180 tablet, Rfl: 3   pravastatin (PRAVACHOL) 20 MG tablet, Take 1 tablet (20 mg total) by mouth every evening., Disp: 90 tablet, Rfl: 3   riTUXimab (RITUXAN) 100 MG/10ML injection, Inject into the vein. [facility] but unsure of dose - no more doses for next 6 months as of 09/14/18, Disp: , Rfl:    venlafaxine XR (EFFEXOR-XR) 75 MG 24 hr capsule, Take 75 mg by mouth at bedtime. , Disp: , Rfl:    [START ON 06/23/2019] methylPREDNISolone (MEDROL) 2 MG tablet, Take 1 tablet (2 mg total) by mouth every other day. Alternate with the 73m dose (Patient not taking: Reported on 06/21/2019), Disp:  , Rfl:    [START ON  06/22/2019] methylPREDNISolone (MEDROL) 4 MG tablet, Take 1 tablet (4 mg total) by mouth every other day. Alternate with the 273mdose (Patient not taking: Reported on 06/21/2019), Disp:  ,  Rfl:       Objective:   There were no vitals filed for this visit.  Estimated body mass index is 22.6 kg/m as calculated from the following:   Height as of 06/11/19: 5' 6" (1.676 m).   Weight as of 06/11/19: 140 lb (63.5 kg).  _0 @  There were no vitals filed for this visit.   Physical Exam Telephone visits - no vitals      Assessment:       ICD-10-CM   1. Advice given about COVID-19 virus infection  Z71.89   2. Interstitial lung disease due to connective tissue disease ([place])  J84.89    M35.9   3. 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 This includes one or more of the following documented above: discussion of test results, diagnostic or treatment recommendations, prognosis, risks and benefits of management options,  instructions, education, compliance or risk-factor reduction   SIGNATURE    Dr. Brand Males, M.D., F.C.C.P,  Pulmonary and Critical Care Medicine Staff Physician, [place] Director - Interstitial Lung Disease  Program  [facility] [place], [place], [address]  Pager: [phone], If no answer or between  15:00h - 7:00h: call [phone] Telephone: [phone]  10:20 AM 06/21/2019

## 2019-06-21 NOTE — Patient Instructions (Addendum)
Advice given about COVID-19 virus infection  - given over phone   Interstitial lung disease due to connective tissue disease ([place])  - continue Rituxan and prednisone  - start Ofev in Jan 2021 - do ILD PRO visit with [facility] in dec 2020 - return to clinic in feb 2021 with LFT check  Coronary artery calcification seen on CAT scan   - CMA will ensure cardiology referral  Asthma, unspecified asthma severity, unspecified whether complicated, unspecified whether persistent   - dc singulair - done at last visit  - continue dulera scheduled  - alb as needed  Followup  - feb 2021 spirometry and dlco  - return to clini 30 min slot in feb 2021

## 2019-06-21 NOTE — Telephone Encounter (Signed)
We received her New start paperwork, but it was not attached. We will make a new appeal document and submit. Thanks!

## 2019-06-21 NOTE — Addendum Note (Signed)
Addended by: Lorretta Harp on: 06/21/2019 10:28 AM   Modules accepted: Orders

## 2019-06-21 NOTE — Telephone Encounter (Signed)
Called [employer] to check status of Ofev Appeal, rep Karena Addison states she does not see where an appeal was received.   Raquel Sarna, Do you still have the appeal documents?  Appeal Fax number# [phone] Appeal phone# [phone], select provider option.  Member [id number] and DOB must be on the cover letter.  3:07 PM Stacey [name], CPhT

## 2019-06-21 NOTE — Telephone Encounter (Signed)
Did I give you all the documentation on her Monday when I handed you a stack of new starts? I do not have it with my papers anymore.

## 2019-06-23 ENCOUNTER — Encounter: Payer: Self-pay | Admitting: Pharmacist

## 2019-06-23 NOTE — Telephone Encounter (Signed)
Appeal faxed to [facility].  Will update when we receive a response.   Mariella Saa, PharmD, [place], [place] Clinical Specialty Pharmacist [phone]  06/23/2019 3:48 PM

## 2019-06-28 ENCOUNTER — Other Ambulatory Visit: Payer: Self-pay | Admitting: Internal Medicine

## 2019-06-28 DIAGNOSIS — U071 COVID-19: Secondary | ICD-10-CM

## 2019-06-28 NOTE — Telephone Encounter (Signed)
Received fax from [facility] notifying that decision for Ofev coverage has been overturned and patient is approved from 06/06/2019 till 06/05/2020.  Will send document to scan center.  Will follow up with patient once test claim processed and if she needs to move forward with patient assistance.   Mariella Saa, PharmD, [place], [place] Clinical Specialty Pharmacist [phone]  06/28/2019 11:58 AM   Ran test claim for 1 month supply, patient's copay is $2,182.97. Patient is in the coverage gap and will need Patient assistance. Patient was previously referred to [place].  1:43 PM Beatriz Chancellor, CPhT

## 2019-06-29 DIAGNOSIS — M25512 Pain in left shoulder: Secondary | ICD-10-CM | POA: Diagnosis not present

## 2019-07-03 ENCOUNTER — Encounter (HOSPITAL_COMMUNITY): Payer: Self-pay

## 2019-07-03 ENCOUNTER — Inpatient Hospital Stay (HOSPITAL_COMMUNITY)
Admission: EM | Admit: 2019-07-03 | Discharge: 2019-07-06 | DRG: 205 | Disposition: A | Discharge status: Home Health | Payer: Medicare Other | Attending: Internal Medicine | Admitting: Internal Medicine

## 2019-07-03 ENCOUNTER — Emergency Department (HOSPITAL_COMMUNITY): Payer: Medicare Other

## 2019-07-03 ENCOUNTER — Other Ambulatory Visit: Payer: Self-pay

## 2019-07-03 DIAGNOSIS — M069 Rheumatoid arthritis, unspecified: Secondary | ICD-10-CM | POA: Diagnosis present

## 2019-07-03 DIAGNOSIS — S2239XA Fracture of one rib, unspecified side, initial encounter for closed fracture: Secondary | ICD-10-CM | POA: Diagnosis present

## 2019-07-03 DIAGNOSIS — Z9981 Dependence on supplemental oxygen: Secondary | ICD-10-CM

## 2019-07-03 DIAGNOSIS — U071 COVID-19: Secondary | ICD-10-CM

## 2019-07-03 DIAGNOSIS — E785 Hyperlipidemia, unspecified: Secondary | ICD-10-CM | POA: Diagnosis present

## 2019-07-03 DIAGNOSIS — F329 Major depressive disorder, single episode, unspecified: Secondary | ICD-10-CM | POA: Diagnosis present

## 2019-07-03 DIAGNOSIS — Z825 Family history of asthma and other chronic lower respiratory diseases: Secondary | ICD-10-CM

## 2019-07-03 DIAGNOSIS — F419 Anxiety disorder, unspecified: Secondary | ICD-10-CM | POA: Diagnosis present

## 2019-07-03 DIAGNOSIS — J849 Interstitial pulmonary disease, unspecified: Secondary | ICD-10-CM | POA: Diagnosis present

## 2019-07-03 DIAGNOSIS — B0229 Other postherpetic nervous system involvement: Secondary | ICD-10-CM | POA: Diagnosis present

## 2019-07-03 DIAGNOSIS — K5900 Constipation, unspecified: Secondary | ICD-10-CM | POA: Diagnosis present

## 2019-07-03 DIAGNOSIS — S27321A Contusion of lung, unilateral, initial encounter: Secondary | ICD-10-CM | POA: Diagnosis not present

## 2019-07-03 DIAGNOSIS — I10 Essential (primary) hypertension: Secondary | ICD-10-CM | POA: Diagnosis present

## 2019-07-03 DIAGNOSIS — S2242XA Multiple fractures of ribs, left side, initial encounter for closed fracture: Secondary | ICD-10-CM | POA: Diagnosis present

## 2019-07-03 DIAGNOSIS — S2249XA Multiple fractures of ribs, unspecified side, initial encounter for closed fracture: Secondary | ICD-10-CM | POA: Diagnosis present

## 2019-07-03 DIAGNOSIS — R079 Chest pain, unspecified: Secondary | ICD-10-CM

## 2019-07-03 DIAGNOSIS — R0602 Shortness of breath: Secondary | ICD-10-CM

## 2019-07-03 DIAGNOSIS — Z79899 Other long term (current) drug therapy: Secondary | ICD-10-CM

## 2019-07-03 DIAGNOSIS — M810 Age-related osteoporosis without current pathological fracture: Secondary | ICD-10-CM | POA: Diagnosis present

## 2019-07-03 DIAGNOSIS — I951 Orthostatic hypotension: Secondary | ICD-10-CM | POA: Diagnosis present

## 2019-07-03 DIAGNOSIS — J9621 Acute and chronic respiratory failure with hypoxia: Secondary | ICD-10-CM | POA: Diagnosis present

## 2019-07-03 DIAGNOSIS — R0902 Hypoxemia: Secondary | ICD-10-CM

## 2019-07-03 DIAGNOSIS — K219 Gastro-esophageal reflux disease without esophagitis: Secondary | ICD-10-CM | POA: Diagnosis present

## 2019-07-03 DIAGNOSIS — Y92009 Unspecified place in unspecified non-institutional (private) residence as the place of occurrence of the external cause: Secondary | ICD-10-CM

## 2019-07-03 DIAGNOSIS — Z7951 Long term (current) use of inhaled steroids: Secondary | ICD-10-CM

## 2019-07-03 DIAGNOSIS — W19XXXA Unspecified fall, initial encounter: Secondary | ICD-10-CM | POA: Diagnosis present

## 2019-07-03 DIAGNOSIS — Z8619 Personal history of other infectious and parasitic diseases: Secondary | ICD-10-CM

## 2019-07-03 LAB — CBC WITH DIFFERENTIAL/PLATELET
Abs Immature Granulocytes: 0.07 10*3/uL (ref 0.00–0.07)
Basophils Absolute: 0 10*3/uL (ref 0.0–0.1)
Basophils Relative: 0 %
Eosinophils Absolute: 0.3 10*3/uL (ref 0.0–0.5)
Eosinophils Relative: 4 %
HCT: 38.5 % (ref 36.0–46.0)
Hemoglobin: 12.1 g/dL (ref 12.0–15.0)
Immature Granulocytes: 1 %
Lymphocytes Relative: 27 %
Lymphs Abs: 2.1 10*3/uL (ref 0.7–4.0)
MCH: 29.1 pg (ref 26.0–34.0)
MCHC: 31.4 g/dL (ref 30.0–36.0)
MCV: 92.5 fL (ref 80.0–100.0)
Monocytes Absolute: 0.9 10*3/uL (ref 0.1–1.0)
Monocytes Relative: 12 %
Neutro Abs: 4.3 10*3/uL (ref 1.7–7.7)
Neutrophils Relative %: 56 %
Platelets: 244 10*3/uL (ref 150–400)
RBC: 4.16 MIL/uL (ref 3.87–5.11)
RDW: 15.1 % (ref 11.5–15.5)
WBC: 7.7 10*3/uL (ref 4.0–10.5)
nRBC: 0 % (ref 0.0–0.2)

## 2019-07-03 LAB — COMPREHENSIVE METABOLIC PANEL
ALT: 17 U/L (ref 0–44)
AST: 19 U/L (ref 15–41)
Albumin: 3.3 g/dL — ABNORMAL LOW (ref 3.5–5.0)
Alkaline Phosphatase: 78 U/L (ref 38–126)
Anion gap: 9 (ref 5–15)
BUN: 10 mg/dL (ref 8–23)
CO2: 29 mmol/L (ref 22–32)
Calcium: 8.9 mg/dL (ref 8.9–10.3)
Chloride: 95 mmol/L — ABNORMAL LOW (ref 98–111)
Creatinine, Ser: 0.72 mg/dL (ref 0.44–1.00)
GFR calc Af Amer: >60 mL/min (ref >60)
GFR calc non Af Amer: >60 mL/min (ref >60)
Glucose, Bld: 97 mg/dL (ref 70–99)
Potassium: 4.7 mmol/L (ref 3.5–5.1)
Sodium: 133 mmol/L — ABNORMAL LOW (ref 135–145)
Total Bilirubin: 0.5 mg/dL (ref 0.3–1.2)
Total Protein: 6.6 g/dL (ref 6.5–8.1)

## 2019-07-03 LAB — LACTIC ACID, PLASMA: Lactic Acid, Venous: 0.9 mmol/L (ref 0.5–1.9)

## 2019-07-03 LAB — LIPASE, BLOOD: Lipase: 26 U/L (ref 11–51)

## 2019-07-03 MED ORDER — ALBUTEROL SULFATE HFA 108 (90 BASE) MCG/ACT IN AERS
2.0000 | INHALATION_SPRAY | Freq: Once | RESPIRATORY_TRACT | Status: DC
  Filled 2019-07-03: qty 6.7

## 2019-07-03 MED ORDER — METHYLPREDNISOLONE SODIUM SUCC 125 MG IJ SOLR
125.0000 mg | Freq: Once | INTRAMUSCULAR | Status: AC
  Administered 2019-07-03: 125 mg via INTRAVENOUS
  Filled 2019-07-03: qty 2

## 2019-07-03 MED ORDER — FENTANYL CITRATE (PF) 100 MCG/2ML IJ SOLN
50.0000 ug | Freq: Once | INTRAMUSCULAR | Status: AC
  Administered 2019-07-03: 50 ug via INTRAVENOUS
  Filled 2019-07-03: qty 2

## 2019-07-03 MED ORDER — IOHEXOL 300 MG/ML  SOLN
100.0000 mL | Freq: Once | INTRAMUSCULAR | Status: AC | PRN
  Administered 2019-07-03: 100 mL via INTRAVENOUS

## 2019-07-03 MED ORDER — SODIUM CHLORIDE (PF) 0.9 % IJ SOLN
INTRAMUSCULAR | Status: AC
  Filled 2019-07-03: qty 50

## 2019-07-03 MED ORDER — IPRATROPIUM BROMIDE HFA 17 MCG/ACT IN AERS
2.0000 | INHALATION_SPRAY | Freq: Once | RESPIRATORY_TRACT | Status: AC
  Administered 2019-07-03: 2 via RESPIRATORY_TRACT
  Filled 2019-07-03: qty 12.9

## 2019-07-03 NOTE — ED Triage Notes (Signed)
Per EMS- patient had a fall at home 10  Days ago and was seen by her physician. Patient continues to c/o left flank pain and area is red. Patient states when she coughs the pain is worse.  Paitent normally has Home O2 2L lmin via [place] and sats when EMS arrived were 93%-94%. EMS placed the patient on O2 3L/min via [place] for transport only and sats were 99%.

## 2019-07-03 NOTE — Telephone Encounter (Signed)
Patient's husband returned call and stated that they would not qualify for patient assistance, but will schedule shipment with [facility] once they are ready to start. Advised to reach out to pharmacy team with any other questions.  He advised patient is currently on the way to the ER. States that she has been really weak since she caught Covid-19. He wants to see how this week goes and to confirm with Dr. Chase Caller on start of Ofev.  12:46 PM Beatriz Chancellor, CPhT

## 2019-07-03 NOTE — ED Provider Notes (Signed)
[facility] DEPT Provider Note   CSN: [id number] Arrival date & time: 07/03/19  1242     History Chief Complaint  Patient presents with  . Fall    Stacey [name] is a 77 y.o. female.  The history is provided by the patient, medical records and the spouse. No language interpreter was used.  Fall This is a new problem. The current episode started more than 1 week ago. The problem occurs constantly. The problem has been rapidly worsening. Associated symptoms include chest pain, abdominal pain and shortness of breath. Pertinent negatives include no headaches. The symptoms are aggravated by coughing. Nothing relieves the symptoms. She has tried nothing for the symptoms. The treatment provided no relief.       Past Medical History:  Diagnosis Date  . Abnormal finding of blood chemistry   . Asthma   . H/O measles   . H/O varicella   . Hypertension   . 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 Lichen sclerosus [id number]   Asymptomatic  . Low iron   . Mitral valve prolapse   . Osteoarthritis   . Osteoporosis   . Pneumonia   . Post herpetic neuralgia   . Rheumatoid arthritis(714.0)   . Yeast infection     Patient Active Problem List   Diagnosis Date Noted  . 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 Acute asthma exacerbation 12/18/2018  . Elevated troponin 12/18/2018  . Acute bronchitis 08/30/2018  . Chronic respiratory failure with hypoxia ([place]) 04/06/2018  . Essential hypertension 03/24/2018  . Depression 03/24/2018  . Asthma exacerbation 03/23/2018  . DOE (dyspnea on exertion) 03/21/2018  . 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 esophagitis 03/21/2018  . Chronic lung disease   . Hypoxia   . Dyspnea 01/23/2018  . ILD (interstitial lung disease) ([place]) 01/23/2018  . 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 Multiple pulmonary nodules/RA lung dz  05/29/2013  . Cough variant asthma vs uacs   05/29/2013  . Rheumatoid arthritis ([place]) 01/13/2012  . Osteopenia 01/13/2012  . Lichen sclerosus et atrophicus of the vulva 01/13/2012  . Asthma 01/13/2012    Past Surgical History:  Procedure Laterality Date  . CHOLECYSTECTOMY  2011  . WISDOM TOOTH EXTRACTION       OB History    Gravida  2   Para  2   Term  2   Preterm      AB      Living  2     SAB      TAB      Ectopic      Multiple      Live Births  2           Family History  Problem Relation Age of Onset  . Asthma Mother   . Anemia Mother   . Polymyalgia rheumatica Mother   . COPD Father   . Pulmonary fibrosis Father   . Breast cancer Maternal Grandmother 88    Social History   Tobacco Use  . Smoking status: Never Smoker  . Smokeless tobacco: Never Used  Substance Use Topics  . Alcohol use: No  . Drug use: No    Home Medications Prior to Admission medications   Medication Sig Start Date End Date Taking? Authorizing Provider  acetaminophen (TYLENOL) 325 MG tablet Take 2 tablets (650 mg total) by mouth every 6 (six) hours as needed for mild pain or headache (fever >/= 101).  06/16/19   Cherene Altes, MD  albuterol (VENTOLIN HFA) 108 (90 Base) MCG/ACT inhaler Inhale 2 puffs into the lungs every 6 (six) hours as needed for wheezing or shortness of breath. 01/02/19   Fenton Foy, NP  ALPRAZolam Duanne Moron) 0.25 MG tablet Take 1 tablet (0.25 mg total) by mouth 3 (three) times daily as needed for anxiety. 06/16/19   Cherene Altes, MD  amitriptyline (ELAVIL) 25 MG tablet Take 25 mg by mouth at bedtime. 02/04/15   [provider]  benzonatate (TESSALON) 200 MG capsule Take 1 capsule (200 mg total) by mouth 3 (three) times daily as needed for cough. 05/09/19   Donita Brooks, NP  beta carotene w/minerals (OCUVITE) tablet Take 1 tablet by mouth daily.    [provider]  chlorpheniramine-HYDROcodone (TUSSIONEX) 10-8 MG/5ML SUER Take 5 mLs by mouth every 12 (twelve) hours as  needed for cough (Not releaved by Delsym  or Robitussin.). 05/09/19   Donita Brooks, NP  cholecalciferol (VITAMIN D) 1000 UNITS tablet Take 1,000 Units by mouth daily.    [provider]  dextromethorphan (DELSYM) 30 MG/5ML liquid Take 2.5 mLs (15 mg total) by mouth at bedtime. 01/27/18   Lavina Hamman, MD  diltiazem (CARDIZEM CD) 180 MG 24 hr capsule Take 180 mg by mouth daily. 05/25/19   [provider]  gabapentin (NEURONTIN) 300 MG capsule Take 600 mg by mouth at bedtime.    [provider]  ipratropium-albuterol (DUONEB) 0.5-2.5 (3) MG/3ML SOLN Take 3 mLs by nebulization 3 (three) times daily. Patient taking differently: Take 3 mLs by nebulization every 6 (six) hours as needed (sob).  05/10/19   Amin, Jeanella Flattery, MD  methylPREDNISolone (MEDROL) 2 MG tablet Take 1 tablet (2 mg total) by mouth every other day. Alternate with the 4mg  dose Patient not taking: Reported on 06/21/2019 06/23/19   Cherene Altes, MD  methylPREDNISolone (MEDROL) 4 MG tablet Take 1 tablet (4 mg total) by mouth every other day. Alternate with the 2mg  dose Patient not taking: Reported on 06/21/2019 06/22/19   Cherene Altes, MD  mometasone-formoterol (DULERA) 100-5 MCG/ACT AERO Inhale 2 puffs into the lungs 2 (two) times daily. 01/02/19   Fenton Foy, NP  pantoprazole (PROTONIX) 40 MG tablet Take 1 tablet (40 mg total) by mouth 2 (two) times daily before a meal. 05/09/19   Donita Brooks, NP  pravastatin (PRAVACHOL) 20 MG tablet Take 1 tablet (20 mg total) by mouth every evening. 01/06/19 01/01/20  Buford Dresser, MD  riTUXimab (RITUXAN) 100 MG/10ML injection Inject into the vein. Receives at [facility] but unsure of dose - no more doses for next 6 months as of 09/14/18    [provider]  venlafaxine XR (EFFEXOR-XR) 75 MG 24 hr capsule Take 75 mg by mouth at bedtime.     [provider]    Allergies    Penicillins, Remicade [infliximab], Sulfa antibiotics, and  Sulfamethoxazole  Review of Systems   Review of Systems  Constitutional: Positive for fatigue. Negative for chills, diaphoresis and fever.  HENT: Negative for congestion.   Respiratory: Positive for cough, chest tightness, shortness of breath and wheezing. Negative for stridor.   Cardiovascular: Positive for chest pain.  Gastrointestinal: Positive for abdominal pain. Negative for constipation, diarrhea and nausea.  Genitourinary: Positive for flank pain (L).  Musculoskeletal: Negative for back pain, neck pain and neck stiffness.  Neurological: Negative for weakness, light-headedness, numbness and headaches.  Psychiatric/Behavioral: Negative for agitation.  All  other systems reviewed and are negative.   Physical Exam Updated Vital Signs BP 119/63 (BP Location: Left Arm)   Pulse 83   Temp 98.4 F (36.9 C) (Oral)   Resp 16   Ht 5\' 6"  (1.676 m)   Wt 61.2 kg   SpO2 97%   BMI 21.79 kg/m   Physical Exam Vitals and nursing note reviewed.  Constitutional:      General: She is not in acute distress.    Appearance: She is well-developed. She is not ill-appearing, toxic-appearing or diaphoretic.  HENT:     Head: Normocephalic and atraumatic.     Nose: No congestion or rhinorrhea.     Mouth/Throat:     Mouth: Mucous membranes are moist.     Pharynx: No oropharyngeal exudate or posterior oropharyngeal erythema.  Cardiovascular:     Rate and Rhythm: Normal rate and regular rhythm.     Pulses: Normal pulses.     Heart sounds: No murmur.  Pulmonary:     Effort: Pulmonary effort is normal. No respiratory distress.     Breath sounds: Rhonchi present.  Chest:     Chest wall: Tenderness present.    Abdominal:     General: Abdomen is flat. Bowel sounds are normal. There is no distension.     Palpations: Abdomen is soft.     Tenderness: There is abdominal tenderness in the left upper quadrant. There is no right CVA tenderness or left CVA tenderness.    Musculoskeletal:         General: No tenderness.     Cervical back: Neck supple.     Right lower leg: No edema.     Left lower leg: No edema.  Skin:    General: Skin is warm and dry.     Capillary Refill: Capillary refill takes less than 2 seconds.     Findings: No erythema.  Neurological:     General: No focal deficit present.     Mental Status: She is alert.  Psychiatric:        Mood and Affect: Mood normal.     ED Results / Procedures / Treatments   Labs (all labs ordered are listed, but only abnormal results are displayed) Labs Reviewed  COMPREHENSIVE METABOLIC PANEL - Abnormal; Notable for the following components:      Result Value   Sodium 133 (*)    Chloride 95 (*)    Albumin 3.3 (*)    All other components within normal limits  CBC WITH DIFFERENTIAL/PLATELET  LIPASE, BLOOD  LACTIC ACID, PLASMA  LACTIC ACID, PLASMA  COMPREHENSIVE METABOLIC PANEL  CBC    EKG EKG Interpretation  Date/Time:  Monday July 03 2019 21:52:04 EST Ventricular Rate:  70 PR Interval:    QRS Duration: 94 QT Interval:  412 QTC Calculation: 445 R Axis:   1 Text Interpretation: Sinus or ectopic atrial rhythm When compared to prior, more wandering baseline. No STEMI Confirmed by Antony Blackbird [phone]) on 07/03/2019 10:00:20 PM   Radiology CT Chest W Contrast  Result Date: 07/04/2019 CLINICAL DATA:  Left flank pain, after fall question trauma EXAM: CT CHEST, abdomen, and pelvis WITH CONTRAST TECHNIQUE: Multidetector CT imaging of the chest was performed during intravenous contrast administration. CONTRAST:  184mL OMNIPAQUE IOHEXOL 300 MG/ML  SOLN COMPARISON:  CT chest April 24, 2019 FINDINGS: Cardiovascular: Normal heart size. No significant pericardial fluid/thickening. Coronary artery calcifications are seen. Scattered aortic atherosclerosis is noted. 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 No evidence of acute thoracic aortic injury. No central pulmonary  emboli. Mediastinum/Nodes: No pneumomediastinum. No mediastinal hematoma. Unremarkable esophagus. No axillary, mediastinal or hilar lymphadenopathy. Lungs/Pleura:There is a new small area of patchy/ground-glass opacity seen in the periphery of the left lower lung, series 3, image 37. There is bilateral areas subpleural reticulonodular opacities and small amount of honeycombing seen peripherally. Biapical scarring is noted with pleural calcification. This appears to be unchanged since the prior exam. No pneumothorax. No pleural effusion. Musculoskeletal: There is nondisplaced fractures of the posterior left fifth, 6, 8, 9, and tenth left ribs. There is unchanged slight superior compression deformities of the T5, T6, T10 vertebral bodies with less than 25% loss in height. Abdomen/pelvis: Hepatobiliary: Homogeneous hepatic attenuation without traumatic injury. No focal lesion. The patient is status post cholecystectomy. No biliary ductal dilation. Pancreas: No evidence for traumatic injury. Portions are partially obscured by adjacent bowel loops and paucity of intra-abdominal fat. No ductal dilatation or inflammation. Spleen: Homogeneous attenuation without traumatic injury. Normal in size. Adrenals/Urinary Tract: No adrenal hemorrhage. Kidneys demonstrate symmetric enhancement and excretion on delayed phase imaging. No evidence or renal injury. Ureters are well opacified proximal through mid portion. Bladder is physiologically distended without wall thickening. Stomach/Bowel: Suboptimally assessed without enteric contrast, allowing for this, no evidence of bowel injury. Stomach physiologically distended. There are no dilated or thickened small or large bowel loops. Moderate stool burden. No evidence of mesenteric hematoma. No free air free fluid. Vascular/Lymphatic: No acute vascular injury. The abdominal aorta and IVC are intact. No evidence of retroperitoneal, abdominal, or pelvic adenopathy. Scattered aortic  atherosclerotic calcifications are seen without aneurysmal dilatation. Reproductive: No acute abnormality. Other: No focal contusion or abnormality of the abdominal wall. Musculoskeletal: No acute fracture of the lumbar spine or bony pelvis. IMPRESSION: 1. Nondisplaced posterior left fifth, sixth, eighth, ninth, and tenth rib fractures. 2. New ground-glass/patchy airspace opacity in the periphery of the left lower lung which could be due to atelectasis and/or contusion. 3. Stable findings suggestive of chronic fibrotic interstitial lung disease as seen on prior dating back to April 24, 2019. 4.  Aortic Atherosclerosis (ICD10-I70.0). 5. Chronic slight superior compression deformities of T5, T6, and T10 with less than 25% loss in vertebral body height. Electronically Signed   By: Prudencio Pair M.D.   On: 07/04/2019 00:31   CT ABDOMEN PELVIS W CONTRAST  Result Date: 07/04/2019 CLINICAL DATA:  Left flank pain, after fall question trauma EXAM: CT CHEST, abdomen, and pelvis WITH CONTRAST TECHNIQUE: Multidetector CT imaging of the chest was performed during intravenous contrast administration. CONTRAST:  123mL OMNIPAQUE IOHEXOL 300 MG/ML  SOLN COMPARISON:  CT chest April 24, 2019 FINDINGS: Cardiovascular: Normal heart size. No significant pericardial fluid/thickening. Coronary artery calcifications are seen. Scattered aortic atherosclerosis is noted. 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 No evidence of acute thoracic aortic injury. No central pulmonary emboli. Mediastinum/Nodes: No pneumomediastinum. No mediastinal hematoma. Unremarkable esophagus. No axillary, mediastinal or hilar lymphadenopathy. Lungs/Pleura:There is a new small area of patchy/ground-glass opacity seen in the periphery of the left lower lung, series 3, image 37. There is bilateral areas subpleural reticulonodular opacities and small amount of honeycombing seen peripherally. Biapical  scarring is noted with pleural calcification. This appears to be unchanged since the prior exam. No pneumothorax. No pleural effusion. Musculoskeletal: There is nondisplaced fractures of the posterior left fifth, 6, 8, 9, and tenth left ribs. There is unchanged slight superior  compression deformities of the T5, T6, T10 vertebral bodies with less than 25% loss in height. Abdomen/pelvis: Hepatobiliary: Homogeneous hepatic attenuation without traumatic injury. No focal lesion. The patient is status post cholecystectomy. No biliary ductal dilation. Pancreas: No evidence for traumatic injury. Portions are partially obscured by adjacent bowel loops and paucity of intra-abdominal fat. No ductal dilatation or inflammation. Spleen: Homogeneous attenuation without traumatic injury. Normal in size. Adrenals/Urinary Tract: No adrenal hemorrhage. Kidneys demonstrate symmetric enhancement and excretion on delayed phase imaging. No evidence or renal injury. Ureters are well opacified proximal through mid portion. Bladder is physiologically distended without wall thickening. Stomach/Bowel: Suboptimally assessed without enteric contrast, allowing for this, no evidence of bowel injury. Stomach physiologically distended. There are no dilated or thickened small or large bowel loops. Moderate stool burden. No evidence of mesenteric hematoma. No free air free fluid. Vascular/Lymphatic: No acute vascular injury. The abdominal aorta and IVC are intact. No evidence of retroperitoneal, abdominal, or pelvic adenopathy. Scattered aortic atherosclerotic calcifications are seen without aneurysmal dilatation. Reproductive: No acute abnormality. Other: No focal contusion or abnormality of the abdominal wall. Musculoskeletal: No acute fracture of the lumbar spine or bony pelvis. IMPRESSION: 1. Nondisplaced posterior left fifth, sixth, eighth, ninth, and tenth rib fractures. 2. New ground-glass/patchy airspace opacity in the periphery of the left  lower lung which could be due to atelectasis and/or contusion. 3. Stable findings suggestive of chronic fibrotic interstitial lung disease as seen on prior dating back to April 24, 2019. 4.  Aortic Atherosclerosis (ICD10-I70.0). 5. Chronic slight superior compression deformities of T5, T6, and T10 with less than 25% loss in vertebral body height. Electronically Signed   By: Prudencio Pair M.D.   On: 07/04/2019 00:31    Procedures Procedures (including critical care time)  Medications Ordered in ED Medications  albuterol (VENTOLIN HFA) 108 (90 Base) MCG/ACT inhaler 2 puff (0 puffs Inhalation Hold 07/03/19 2222)  methylPREDNISolone sodium succinate (SOLU-MEDROL) 125 mg/2 mL injection 125 mg (125 mg Intravenous Given 07/03/19 2222)  ipratropium (ATROVENT HFA) inhaler 2 puff (2 puffs Inhalation Given 07/03/19 2300)  fentaNYL (SUBLIMAZE) injection 50 mcg (50 mcg Intravenous Given 07/03/19 2222)  iohexol (OMNIPAQUE) 300 MG/ML solution 100 mL (100 mLs Intravenous Contrast Given 07/03/19 2356)  sodium chloride (PF) 0.9 % injection (  Given by Other 07/04/19 [id number])    ED Course  I have reviewed the triage vital signs and the nursing notes.  Pertinent labs & imaging results that were available during my care of the patient were reviewed by me and considered in my medical decision making (see chart for details).    MDM Rules/Calculators/A&P                      CHALEY FINNELL is a 78 y.o. female with a past medical history significant for rheumatoid arthritis, interstitial lung disease, asthma, GERD, and admission 3 weeks ago for COVID-19 infection who presents with left-sided chest pain, left-sided abdominal pain, and shortness of breath after a fall.  She reports that she had done well after discharge from COVID-19 infection and has been off of oxygen.  She reports she occasionally uses 2 L at home but has not needed it since discharge.  She reports that about 1 week to 10 days ago, she had a  mechanical fall while standing up to use her walker and had an ottoman on the left side of her chest and upper abdomen.  She reports she was seen by her PCP and  had an x-ray done that did not show fractures at that time.  Patient says that over the last few days her symptoms have worsened and she is having significant pain with deep breathing on the left side.  She reports she is having some shortness of breath when she tries to take a deep breath.  She reports no significant nausea or vomiting or headache or neck pain.  She denies any urinary symptoms or GI symptoms.  She reports when she coughs the pain is 10 out of 10.  It is nearly 2 out of 10 at rest as well.  On exam, patient does have tenderness on her left chest wall as well as her left upper abdomen.  Patient does not have any laceration seen or any evidence of shingles.  Patient did not have other back tenderness.  Patient's lungs had wheezing and rhonchi throughout.  On my evaluation, oxygen saturation was around 83% on 3 L, she was increased to 6 L to improve her oxygen saturations into the low 90s.  She had no significant extremity tenderness and good pulses in extremities.  No lower extremity edema seen.  Clinical I am concerned that patient may have traumatic injuries leading to pulmonary contusion or atelectasis after chest trauma.  Will get CT imaging of the chest abdomen pelvis to look for splenic injury, pancreas injury, rib fractures, knee or lung injury.  She likely also may still have coronavirus symptoms with her interstitial lung disease.  She will be given pain medicine as well as labs.  Due to her 6 L oxygen requirement at this time while at rest, anticipate admission for further management.  With the wheezing, will also treat for asthma exacerbation with steroids and albuterol and Atrovent.  12:40 AM CT images came back showing multiple rib fractures with pulmonary contusion.  Suspect this on top of her interstitial lung disease  and Covid is contribute to hypoxia.  We will call trauma for direction as she will need admission.  12:56 AM Just spoke with Dr. Molli Posey with trauma who reviewed the case.  As patient is 10 days out from her trauma, he suspects that this is more related to her Covid and interstitial lung disease than solely the trauma.  She does have likely pulmonary contusion and multiple rib fractures but if her pain is able to be controlled, he did not think that trauma would be able to provide any further management recommendations.  After breathing treatment and steroids, her oxygen saturation is in the 90s on 3 L which is closer to her home oxygen.  With the pulmonary contusion and her symptoms worsening over the last few days, with these multiple rib fractures, do not feel she is safe for discharge home.  Per trauma, she should be admitted to medicine.  Medicine team will be called for admission.   Final Clinical Impression(s) / ED Diagnoses Final diagnoses:  Hypoxia  Closed fracture of multiple ribs of left side, initial encounter  Shortness of breath  Left-sided chest pain  COVID-19  Contusion of left lung, initial encounter    Rx / DC Orders ED Discharge Orders    None      Clinical Impression: 1. Hypoxia   2. Closed fracture of multiple ribs of left side, initial encounter   3. Shortness of breath   4. Left-sided chest pain   5. COVID-19   6. Contusion of left lung, initial encounter     Disposition: Admit  This note was prepared with  assistance of Systems analyst. Occasional wrong-word or sound-a-like substitutions may have occurred due to the inherent limitations of voice recognition software.     Jaymian Bogart, Gwenyth Allegra, MD 07/04/19 [phone]

## 2019-07-03 NOTE — Telephone Encounter (Signed)
Called patient to advise of Approval and next steps, daughter advised that patient was being transported in EMS. Left message for husband. Advised we could follow up next week if needed.  Called [facility], they are trying to reach out to patient to coordinate patient assistance and shipment.  Will follow up.  12:33 PM Beatriz Chancellor, CPhT

## 2019-07-04 DIAGNOSIS — R0602 Shortness of breath: Secondary | ICD-10-CM | POA: Diagnosis present

## 2019-07-04 DIAGNOSIS — B0229 Other postherpetic nervous system involvement: Secondary | ICD-10-CM | POA: Diagnosis present

## 2019-07-04 DIAGNOSIS — E785 Hyperlipidemia, unspecified: Secondary | ICD-10-CM | POA: Diagnosis present

## 2019-07-04 DIAGNOSIS — I951 Orthostatic hypotension: Secondary | ICD-10-CM | POA: Diagnosis present

## 2019-07-04 DIAGNOSIS — S2239XA Fracture of one rib, unspecified side, initial encounter for closed fracture: Secondary | ICD-10-CM | POA: Diagnosis present

## 2019-07-04 DIAGNOSIS — Z9981 Dependence on supplemental oxygen: Secondary | ICD-10-CM | POA: Diagnosis not present

## 2019-07-04 DIAGNOSIS — M069 Rheumatoid arthritis, unspecified: Secondary | ICD-10-CM | POA: Diagnosis present

## 2019-07-04 DIAGNOSIS — Y92009 Unspecified place in unspecified non-institutional (private) residence as the place of occurrence of the external cause: Secondary | ICD-10-CM | POA: Diagnosis not present

## 2019-07-04 DIAGNOSIS — F329 Major depressive disorder, single episode, unspecified: Secondary | ICD-10-CM | POA: Diagnosis present

## 2019-07-04 DIAGNOSIS — Z8619 Personal history of other infectious and parasitic diseases: Secondary | ICD-10-CM | POA: Diagnosis not present

## 2019-07-04 DIAGNOSIS — M810 Age-related osteoporosis without current pathological fracture: Secondary | ICD-10-CM | POA: Diagnosis present

## 2019-07-04 DIAGNOSIS — K5900 Constipation, unspecified: Secondary | ICD-10-CM | POA: Diagnosis present

## 2019-07-04 DIAGNOSIS — S2242XA Multiple fractures of ribs, left side, initial encounter for closed fracture: Secondary | ICD-10-CM | POA: Diagnosis present

## 2019-07-04 DIAGNOSIS — S2249XA Multiple fractures of ribs, unspecified side, initial encounter for closed fracture: Secondary | ICD-10-CM | POA: Diagnosis present

## 2019-07-04 DIAGNOSIS — J849 Interstitial pulmonary disease, unspecified: Secondary | ICD-10-CM | POA: Diagnosis present

## 2019-07-04 DIAGNOSIS — Z7951 Long term (current) use of inhaled steroids: Secondary | ICD-10-CM | POA: Diagnosis not present

## 2019-07-04 DIAGNOSIS — I10 Essential (primary) hypertension: Secondary | ICD-10-CM | POA: Diagnosis present

## 2019-07-04 DIAGNOSIS — Z825 Family history of asthma and other chronic lower respiratory diseases: Secondary | ICD-10-CM | POA: Diagnosis not present

## 2019-07-04 DIAGNOSIS — Z79899 Other long term (current) drug therapy: Secondary | ICD-10-CM | POA: Diagnosis not present

## 2019-07-04 DIAGNOSIS — K219 Gastro-esophageal reflux disease without esophagitis: Secondary | ICD-10-CM | POA: Diagnosis present

## 2019-07-04 DIAGNOSIS — W19XXXA Unspecified fall, initial encounter: Secondary | ICD-10-CM | POA: Diagnosis present

## 2019-07-04 DIAGNOSIS — J9621 Acute and chronic respiratory failure with hypoxia: Secondary | ICD-10-CM | POA: Diagnosis present

## 2019-07-04 DIAGNOSIS — F419 Anxiety disorder, unspecified: Secondary | ICD-10-CM | POA: Diagnosis present

## 2019-07-04 DIAGNOSIS — S27321A Contusion of lung, unilateral, initial encounter: Secondary | ICD-10-CM | POA: Diagnosis present

## 2019-07-04 LAB — COMPREHENSIVE METABOLIC PANEL
ALT: 17 U/L (ref 0–44)
AST: 17 U/L (ref 15–41)
Albumin: 3 g/dL — ABNORMAL LOW (ref 3.5–5.0)
Alkaline Phosphatase: 74 U/L (ref 38–126)
Anion gap: 9 (ref 5–15)
BUN: 11 mg/dL (ref 8–23)
CO2: 27 mmol/L (ref 22–32)
Calcium: 8.9 mg/dL (ref 8.9–10.3)
Chloride: 98 mmol/L (ref 98–111)
Creatinine, Ser: 0.66 mg/dL (ref 0.44–1.00)
GFR calc Af Amer: >60 mL/min (ref >60)
GFR calc non Af Amer: >60 mL/min (ref >60)
Glucose, Bld: 129 mg/dL — ABNORMAL HIGH (ref 70–99)
Potassium: 4.5 mmol/L (ref 3.5–5.1)
Sodium: 134 mmol/L — ABNORMAL LOW (ref 135–145)
Total Bilirubin: 0.4 mg/dL (ref 0.3–1.2)
Total Protein: 6 g/dL — ABNORMAL LOW (ref 6.5–8.1)

## 2019-07-04 LAB — CBC
HCT: 37.2 % (ref 36.0–46.0)
Hemoglobin: 11.9 g/dL — ABNORMAL LOW (ref 12.0–15.0)
MCH: 28.9 pg (ref 26.0–34.0)
MCHC: 32 g/dL (ref 30.0–36.0)
MCV: 90.3 fL (ref 80.0–100.0)
Platelets: 233 10*3/uL (ref 150–400)
RBC: 4.12 MIL/uL (ref 3.87–5.11)
RDW: 14.9 % (ref 11.5–15.5)
WBC: 7.1 10*3/uL (ref 4.0–10.5)
nRBC: 0 % (ref 0.0–0.2)

## 2019-07-04 LAB — SARS CORONAVIRUS 2 (TAT 6-24 HRS): SARS Coronavirus 2: POSITIVE — AB

## 2019-07-04 MED ORDER — HYDROCODONE-ACETAMINOPHEN 5-325 MG PO TABS
1.0000 | ORAL_TABLET | Freq: Two times a day (BID) | ORAL | Status: DC
  Administered 2019-07-04 – 2019-07-05 (×4): 1 via ORAL
  Filled 2019-07-04 (×4): qty 1

## 2019-07-04 MED ORDER — MOMETASONE FURO-FORMOTEROL FUM 100-5 MCG/ACT IN AERO
2.0000 | INHALATION_SPRAY | Freq: Two times a day (BID) | RESPIRATORY_TRACT | Status: DC
  Administered 2019-07-04 – 2019-07-06 (×4): 2 via RESPIRATORY_TRACT
  Filled 2019-07-04: qty 8.8

## 2019-07-04 MED ORDER — POLYETHYLENE GLYCOL 3350 17 G PO PACK
17.0000 g | PACK | Freq: Every day | ORAL | Status: DC | PRN
  Administered 2019-07-06: 17 g via ORAL
  Filled 2019-07-04: qty 1

## 2019-07-04 MED ORDER — DULOXETINE HCL 30 MG PO CPEP: 30.0000 mg | ORAL_CAPSULE | Freq: Every day | ORAL | Status: DC

## 2019-07-04 MED ORDER — FENTANYL CITRATE (PF) 100 MCG/2ML IJ SOLN: 25.0000 ug | Freq: Four times a day (QID) | INTRAMUSCULAR | Status: DC | PRN

## 2019-07-04 MED ORDER — VITAMIN D 25 MCG (1000 UNIT) PO TABS
1000.0000 [IU] | ORAL_TABLET | Freq: Every day | ORAL | Status: DC
  Administered 2019-07-04 – 2019-07-06 (×3): 1000 [IU] via ORAL
  Filled 2019-07-04 (×3): qty 1

## 2019-07-04 MED ORDER — SODIUM CHLORIDE 0.9% FLUSH
3.0000 mL | Freq: Two times a day (BID) | INTRAVENOUS | Status: DC
  Administered 2019-07-04 – 2019-07-05 (×5): 3 mL via INTRAVENOUS

## 2019-07-04 MED ORDER — ACETAMINOPHEN 650 MG RE SUPP: 650.0000 mg | Freq: Four times a day (QID) | RECTAL | Status: DC | PRN

## 2019-07-04 MED ORDER — DULOXETINE HCL 30 MG PO CPEP
30.0000 mg | ORAL_CAPSULE | Freq: Two times a day (BID) | ORAL | Status: DC
  Administered 2019-07-04 – 2019-07-06 (×5): 30 mg via ORAL
  Filled 2019-07-04 (×5): qty 1

## 2019-07-04 MED ORDER — ONDANSETRON HCL 4 MG/2ML IJ SOLN: 4.0000 mg | Freq: Four times a day (QID) | INTRAMUSCULAR | Status: DC | PRN

## 2019-07-04 MED ORDER — PROSIGHT PO TABS
1.0000 | ORAL_TABLET | Freq: Every day | ORAL | Status: DC
  Administered 2019-07-04 – 2019-07-06 (×3): 1 via ORAL
  Filled 2019-07-04 (×3): qty 1

## 2019-07-04 MED ORDER — ALPRAZOLAM 0.25 MG PO TABS: 0.2500 mg | ORAL_TABLET | Freq: Three times a day (TID) | ORAL | Status: DC | PRN

## 2019-07-04 MED ORDER — BENZONATATE 100 MG PO CAPS: 200.0000 mg | ORAL_CAPSULE | Freq: Three times a day (TID) | ORAL | Status: DC | PRN

## 2019-07-04 MED ORDER — LIDOCAINE 5 % EX PTCH
1.0000 | MEDICATED_PATCH | CUTANEOUS | Status: DC
  Administered 2019-07-05 – 2019-07-06 (×2): 1 via TRANSDERMAL
  Filled 2019-07-04 (×3): qty 1

## 2019-07-04 MED ORDER — DEXTROMETHORPHAN POLISTIREX ER 30 MG/5ML PO SUER
15.0000 mg | Freq: Every day | ORAL | Status: DC
  Administered 2019-07-05: 15 mg via ORAL
  Filled 2019-07-04 (×4): qty 5

## 2019-07-04 MED ORDER — SODIUM CHLORIDE 0.9% FLUSH: 3.0000 mL | INTRAVENOUS | Status: DC | PRN

## 2019-07-04 MED ORDER — IPRATROPIUM-ALBUTEROL 0.5-2.5 (3) MG/3ML IN SOLN: 3.0000 mL | Freq: Four times a day (QID) | RESPIRATORY_TRACT | Status: DC | PRN

## 2019-07-04 MED ORDER — SODIUM CHLORIDE 0.9 % IV SOLN: 250.0000 mL | INTRAVENOUS | Status: DC | PRN

## 2019-07-04 MED ORDER — PRAVASTATIN SODIUM 20 MG PO TABS
20.0000 mg | ORAL_TABLET | Freq: Every evening | ORAL | Status: DC
  Administered 2019-07-04 – 2019-07-05 (×2): 20 mg via ORAL
  Filled 2019-07-04 (×2): qty 1

## 2019-07-04 MED ORDER — PANTOPRAZOLE SODIUM 40 MG PO TBEC
40.0000 mg | DELAYED_RELEASE_TABLET | Freq: Two times a day (BID) | ORAL | Status: DC
  Administered 2019-07-04 – 2019-07-06 (×5): 40 mg via ORAL
  Filled 2019-07-04 (×5): qty 1

## 2019-07-04 MED ORDER — GABAPENTIN 300 MG PO CAPS
600.0000 mg | ORAL_CAPSULE | Freq: Every day | ORAL | Status: DC
  Administered 2019-07-04 – 2019-07-05 (×2): 600 mg via ORAL
  Filled 2019-07-04 (×2): qty 2

## 2019-07-04 MED ORDER — ACETAMINOPHEN 325 MG PO TABS
650.0000 mg | ORAL_TABLET | Freq: Four times a day (QID) | ORAL | Status: DC | PRN
  Administered 2019-07-04 – 2019-07-05 (×2): 650 mg via ORAL
  Filled 2019-07-04 (×2): qty 2

## 2019-07-04 MED ORDER — SENNOSIDES-DOCUSATE SODIUM 8.6-50 MG PO TABS
1.0000 | ORAL_TABLET | Freq: Two times a day (BID) | ORAL | Status: DC
  Administered 2019-07-04 – 2019-07-06 (×6): 1 via ORAL
  Filled 2019-07-04 (×6): qty 1

## 2019-07-04 MED ORDER — ENOXAPARIN SODIUM 40 MG/0.4ML ~~LOC~~ SOLN
40.0000 mg | Freq: Every day | SUBCUTANEOUS | Status: DC
  Administered 2019-07-04 – 2019-07-06 (×3): 40 mg via SUBCUTANEOUS
  Filled 2019-07-04 (×4): qty 0.4

## 2019-07-04 MED ORDER — HYDROCODONE-ACETAMINOPHEN 5-325 MG PO TABS
1.0000 | ORAL_TABLET | Freq: Four times a day (QID) | ORAL | Status: DC | PRN
  Administered 2019-07-05 – 2019-07-06 (×2): 1 via ORAL
  Filled 2019-07-04 (×2): qty 1

## 2019-07-04 MED ORDER — AMITRIPTYLINE HCL 25 MG PO TABS
25.0000 mg | ORAL_TABLET | Freq: Every day | ORAL | Status: DC
  Administered 2019-07-04 – 2019-07-05 (×2): 25 mg via ORAL
  Filled 2019-07-04 (×2): qty 1

## 2019-07-04 NOTE — Progress Notes (Signed)
Spoke with Dr Roswell Nickel regarding COVID positive test on this admission following positive results and treatment 06/11/2019.  Could be considered immunocompromised due to chronic immunosuppression with steroids.  Placed on Airborne precautions.

## 2019-07-04 NOTE — ED Notes (Signed)
Pt provided food tray

## 2019-07-04 NOTE — Progress Notes (Signed)
78 year old female with history of HTN, depression, GERD, rheumatoid arthritis, interstitial lung disease who is on chronic steroids and chronic O2 2 L via nasal cannula presented after a fall.  Of note patient tested positive for COVID-19 on 11/18 and recently admitted for Covid pneumonia on 11/29, treated with remdesivir/Decadron. Patient states her blood pressure medications were recently adjusted (changed to Cardizem few weeks back) after which she was feeling wobbly and lightheaded.  On PCP follow-up she was told to discontinue blood pressure medications as her blood pressure was low normal with systolic in the 0000000.  She states she felt better after but not quite to baseline.  Yesterday she again felt "off balance" and sustained a fall resulting in multiple rib fractures (Nondisplaced posterior left fifth, sixth, eighth, ninth, and tenth rib fractures).  CT chest/abdomen/pelvis did not reveal any other traumatic findings.  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  Patient admitted to [facility] service for pain control.  She received IV fentanyl, Solu-Medrol and nebulizer treatments while in ED.  She is currently on oral medications but but rating pain level at 8/10.  Will have IV medications available for breakthrough pain and order lidocaine patch for local pain rellief.  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  Will obtain PT evaluation and orthostatic blood pressure checks.

## 2019-07-04 NOTE — H&P (Addendum)
TRH H&P    Patient Demographics:    Stacey [name], is a 78 y.o. female  MRN: [id number]  DOB - Apr 30, 1941  Admit Date - 07/03/2019  Referring MD/NP/PA:  Dina Rich  Outpatient Primary MD for the patient is Shon Baton, MD  Patient coming from:  home  Chief complaint-  Rib pain   HPI:    Stacey [name]  is a 78 y.o. 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 And notes worsening left sided pain.    In ED,  T 98.4, P 83, R 16, Bp 119/63  Pox 97% on o2 [place] 2L  Wt 61.2kg  CT chest / abd/ pelvis IMPRESSION: 1. Nondisplaced posterior left fifth, sixth, eighth, ninth, and tenth rib fractures. 2. New ground-glass/patchy airspace opacity in the periphery of the left lower lung which could be due to atelectasis and/or contusion. 3. Stable findings suggestive of chronic fibrotic interstitial lung disease as seen on prior dating back to April 24, 2019. 4.  Aortic Atherosclerosis (ICD10-I70.0). 5. Chronic slight superior compression deformities of T5, T6, and T10 with less than 25% loss in vertebral body height.  Wbc 7.7, Hgb 12.1, plt 244 Na 133, K 4.7, Bun 10, Creatinine 0.72 Ast 19, Alt 17 Lipase 26 Lactic acid 0.9  ED spoke with [place] and apparently no need to transfer to [facility] for evaluation per ED.   Pt given fentanyl 50 micrograms iv x1, solumedrol 125mg  iv x1, and atrovent 2puff x1 and albuterol 2puff x1  Pt will be admitted obs for control of intractable rib pain .     Review of systems:    In addition to the HPI above,  No Fever-chills, No Headache, No changes with Vision or hearing, No problems swallowing food or Liquids, No Cough or Shortness of Breath beyond her  baseline  No Abdominal pain, No Nausea or Vomiting, bowel movements are regular, No Blood in stool or Urine, No dysuria, No new skin rashes or bruises, No new joints pains-aches,  No new weakness, tingling, numbness in any extremity, No recent weight gain or loss, No polyuria, polydypsia or polyphagia, No significant Mental Stressors.  All other systems reviewed and are negative.    Past History of the following :    Past Medical History:  Diagnosis Date  . Abnormal finding of blood chemistry   . Asthma   . H/O measles   . H/O varicella   . Hypertension   . 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[place])   . 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 Lichen sclerosus [id number]   Asymptomatic  . Low iron   . Mitral valve prolapse   . Osteoarthritis   . Osteoporosis   . Pneumonia   . Post herpetic neuralgia   . Rheumatoid arthritis(714.0)   . Yeast infection       Past Surgical History:  Procedure  Laterality Date  . CHOLECYSTECTOMY  2011  . WISDOM TOOTH EXTRACTION        Social History:      Social History   Tobacco Use  . Smoking status: Never Smoker  . Smokeless tobacco: Never Used  Substance Use Topics  . Alcohol use: No       Family History :     Family History  Problem Relation Age of Onset  . Asthma Mother   . Anemia Mother   . Polymyalgia rheumatica Mother   . COPD Father   . Pulmonary fibrosis Father   . Breast cancer Maternal Grandmother 88       Home Medications:   Prior to Admission medications   Medication Sig Start Date End Date Taking? Authorizing Provider  acetaminophen (TYLENOL) 325 MG tablet Take 2 tablets (650 mg total) by mouth every 6 (six) hours as needed for mild pain or headache (fever >/= 101). 06/16/19  Yes Cherene Altes, MD  albuterol (VENTOLIN HFA) 108 (90 Base) MCG/ACT inhaler Inhale 2 puffs into the lungs every 6 (six) hours as needed for wheezing or shortness of breath. 01/02/19  Yes Fenton Foy, NP  ALPRAZolam Duanne Moron) 0.25 MG  tablet Take 1 tablet (0.25 mg total) by mouth 3 (three) times daily as needed for anxiety. 06/16/19  Yes Cherene Altes, MD  amitriptyline (ELAVIL) 25 MG tablet Take 25 mg by mouth at bedtime. 02/04/15  Yes [provider]  benzonatate (TESSALON) 200 MG capsule Take 1 capsule (200 mg total) by mouth 3 (three) times daily as needed for cough. 05/09/19  Yes Ollis, Brandi L, NP  beta carotene w/minerals (OCUVITE) tablet Take 1 tablet by mouth daily.   Yes [provider]  chlorpheniramine-HYDROcodone (TUSSIONEX) 10-8 MG/5ML SUER Take 5 mLs by mouth every 12 (twelve) hours as needed for cough (Not releaved by Delsym  or Robitussin.). 05/09/19  Yes Ollis, Brandi L, NP  cholecalciferol (VITAMIN D) 1000 UNITS tablet Take 1,000 Units by mouth daily.   Yes [provider]  dextromethorphan (DELSYM) 30 MG/5ML liquid Take 2.5 mLs (15 mg total) by mouth at bedtime. 01/27/18  Yes Lavina Hamman, MD  gabapentin (NEURONTIN) 300 MG capsule Take 600 mg by mouth at bedtime.   Yes [provider]  ipratropium-albuterol (DUONEB) 0.5-2.5 (3) MG/3ML SOLN Take 3 mLs by nebulization 3 (three) times daily. Patient taking differently: Take 3 mLs by nebulization every 6 (six) hours as needed (sob).  05/10/19  Yes Amin, Ankit Chirag, MD  mometasone-formoterol (DULERA) 100-5 MCG/ACT AERO Inhale 2 puffs into the lungs 2 (two) times daily. 01/02/19  Yes Fenton Foy, NP  pantoprazole (PROTONIX) 40 MG tablet Take 1 tablet (40 mg total) by mouth 2 (two) times daily before a meal. 05/09/19  Yes Ollis, Brandi L, NP  polyethylene glycol (MIRALAX / GLYCOLAX) 17 g packet Take 17 g by mouth daily as needed for mild constipation.   Yes [provider]  pravastatin (PRAVACHOL) 20 MG tablet Take 1 tablet (20 mg total) by mouth every evening. 01/06/19 01/01/20 Yes Buford Dresser, MD  riTUXimab (RITUXAN) 100 MG/10ML injection Inject into the vein. Receives at [facility] but unsure of dose - no  more doses for next 6 months as of 09/14/18   Yes [provider]  venlafaxine XR (EFFEXOR-XR) 75 MG 24 hr capsule Take 75 mg by mouth at bedtime.    Yes [provider]  methylPREDNISolone (MEDROL) 2 MG tablet Take 1 tablet (2 mg total)  by mouth every other day. Alternate with the 4mg  dose Patient not taking: Reported on 06/21/2019 06/23/19   Cherene Altes, MD  methylPREDNISolone (MEDROL) 4 MG tablet Take 1 tablet (4 mg total) by mouth every other day. Alternate with the 2mg  dose Patient not taking: Reported on 06/21/2019 06/22/19   Cherene Altes, MD     Allergies:     Allergies  Allergen Reactions  . Penicillins Other (See Comments)    Reaction unknown occurred during childhood Has patient had a PCN reaction causing immediate rash, facial/tongue/throat swelling, SOB or lightheadedness with hypotension: Unknown Has patient had a PCN reaction causing severe rash involving mucus membranes or skin necrosis: Unknown Has patient had a PCN reaction that required hospitalization: No Has patient had a PCN reaction occurring within the last 10 years: No If all of the above answers are "NO", then may proceed with Cephalosporin use.  Unsur  . Remicade [Infliximab] Other (See Comments)    Reaction unknown  . Sulfa Antibiotics Other (See Comments)    Reaction unknown  . Sulfamethoxazole Other (See Comments)    Unsure of reaction As a child not sure      Physical Exam:   Vitals  Blood pressure (!) 161/76, pulse 73, temperature 98.4 F (36.9 C), temperature source Oral, resp. rate 14, height 5\' 6"  (1.676 m), weight 61.2 kg, SpO2 99 %.  1.  General: axoxo3  2. Psychiatric: euthymic  3. Neurologic: cn2-12 intact, reflexes 2+ symmetric, diffuse with no clonus, motor 5/5 in all 4 ext  4. HEENMT:  Anicteric, pupils 1.11mm symmetric, direct, consensual, near intact Neck: no jvd  5. Respiratory : Slight bibasilar crackles, no wheezing  6. Cardiovascular : rrr  s1, s2, no m/g/r  7. Gastrointestinal:  Abd: soft, nt, nd, +bs  8. Skin:  Ext: no c/c/e, no rash  9.Musculoskeletal:  Good ROM,    Data Review:    CBC Recent Labs  Lab 07/03/19 2231  WBC 7.7  HGB 12.1  HCT 38.5  PLT 244  MCV 92.5  MCH 29.1  MCHC 31.4  RDW 15.1  LYMPHSABS 2.1  MONOABS 0.9  EOSABS 0.3  BASOSABS 0.0   ------------------------------------------------------------------------------------------------------------------  Results for orders placed or performed during the hospital encounter of 07/03/19 (from the past 48 hour(s))  CBC with Differential     Status: None   Collection Time: 07/03/19 10:31 PM  Result Value Ref Range   WBC 7.7 4.0 - 10.5 K/uL   RBC 4.16 3.87 - 5.11 MIL/uL   Hemoglobin 12.1 12.0 - 15.0 g/dL   HCT 38.5 36.0 - 46.0 %   MCV 92.5 80.0 - 100.0 fL   MCH 29.1 26.0 - 34.0 pg   MCHC 31.4 30.0 - 36.0 g/dL   RDW 15.1 11.5 - 15.5 %   Platelets 244 150 - 400 K/uL   nRBC 0.0 0.0 - 0.2 %   Neutrophils Relative % 56 %   Neutro Abs 4.3 1.7 - 7.7 K/uL   Lymphocytes Relative 27 %   Lymphs Abs 2.1 0.7 - 4.0 K/uL   Monocytes Relative 12 %   Monocytes Absolute 0.9 0.1 - 1.0 K/uL   Eosinophils Relative 4 %   Eosinophils Absolute 0.3 0.0 - 0.5 K/uL   Basophils Relative 0 %   Basophils Absolute 0.0 0.0 - 0.1 K/uL   Immature Granulocytes 1 %   Abs Immature Granulocytes 0.07 0.00 - 0.07 K/uL    Comment: Performed at [facility], [address] [place], [place] [address]  Comprehensive metabolic panel     Status: Abnormal   Collection Time: 07/03/19 10:31 PM  Result Value Ref Range   Sodium 133 (L) 135 - 145 mmol/L   Potassium 4.7 3.5 - 5.1 mmol/L   Chloride 95 (L) 98 - 111 mmol/L   CO2 29 22 - 32 mmol/L   Glucose, Bld 97 70 - 99 mg/dL   BUN 10 8 - 23 mg/dL   Creatinine, Ser 0.72 0.44 - 1.00 mg/dL   Calcium 8.9 8.9 - 10.3 mg/dL   Total Protein 6.6 6.5 - 8.1 g/dL   Albumin 3.3 (L) 3.5 - 5.0 g/dL   AST 19 15 - 41 U/L    ALT 17 0 - 44 U/L   Alkaline Phosphatase 78 38 - 126 U/L   Total Bilirubin 0.5 0.3 - 1.2 mg/dL   GFR calc non Af Amer >60 >60 mL/min   GFR calc Af Amer >60 >60 mL/min   Anion gap 9 5 - 15    Comment: Performed at [facility], [place] [address], [place], [place] [address]  Lipase, blood     Status: None   Collection Time: 07/03/19 10:31 PM  Result Value Ref Range   Lipase 26 11 - 51 U/L    Comment: Performed at [facility], [place] [address] [place], [place] [address]  Lactic acid, plasma     Status: None   Collection Time: 07/03/19 10:31 PM  Result Value Ref Range   Lactic Acid, Venous 0.9 0.5 - 1.9 mmol/L    Comment: Performed at [facility], [place] [address], [place], [place] [address]    Chemistries  Recent Labs  Lab 07/03/19 2231  NA 133*  K 4.7  CL 95*  CO2 29  GLUCOSE 97  BUN 10  CREATININE 0.72  CALCIUM 8.9  AST 19  ALT 17  ALKPHOS 78  BILITOT 0.5   ------------------------------------------------------------------------------------------------------------------  ------------------------------------------------------------------------------------------------------------------ GFR: Estimated Creatinine Clearance: 53.4 mL/min (by C-G formula based on SCr of 0.72 mg/dL). Liver Function Tests: Recent Labs  Lab 07/03/19 2231  AST 19  ALT 17  ALKPHOS 78  BILITOT 0.5  PROT 6.6  ALBUMIN 3.3*   Recent Labs  Lab 07/03/19 2231  LIPASE 26   No results for input(s): AMMONIA in the last 168 hours. Coagulation Profile: No results for input(s): INR, PROTIME in the last 168 hours. Cardiac Enzymes: No results for input(s): CKTOTAL, CKMB, CKMBINDEX, TROPONINI in the last 168 hours. BNP (last 3 results) No results for input(s): PROBNP in the last 8760 hours. HbA1C: No results for input(s): HGBA1C in the last 72 hours. CBG: No results for input(s): GLUCAP in the last 168 hours. Lipid Profile: No results for  input(s): CHOL, HDL, LDLCALC, TRIG, CHOLHDL, LDLDIRECT in the last 72 hours. Thyroid Function Tests: No results for input(s): TSH, T4TOTAL, FREET4, T3FREE, THYROIDAB in the last 72 hours. Anemia Panel: No results for input(s): VITAMINB12, FOLATE, FERRITIN, TIBC, IRON, RETICCTPCT in the last 72 hours.  --------------------------------------------------------------------------------------------------------------- Urine analysis:    Component Value Date/Time   COLORURINE YELLOW 12/18/2009 0120   APPEARANCEUR CLEAR 12/18/2009 0120   LABSPEC 1.006 12/18/2009 0120   PHURINE 7.0 12/18/2009 0120   GLUCOSEU NEGATIVE 12/18/2009 0120   HGBUR NEGATIVE 12/18/2009 0120   BILIRUBINUR NEGATIVE 12/18/2009 0120   KETONESUR NEGATIVE 12/18/2009 0120   PROTEINUR NEGATIVE 12/18/2009 0120   UROBILINOGEN 0.2 12/18/2009 0120   NITRITE NEGATIVE 12/18/2009 0120   LEUKOCYTESUR TRACE (A)  12/18/2009 0120      Imaging Results:    CT Chest W Contrast  Result Date: 07/04/2019 CLINICAL DATA:  Left flank pain, after fall question trauma EXAM: CT CHEST, abdomen, and pelvis WITH CONTRAST TECHNIQUE: Multidetector CT imaging of the chest was performed during intravenous contrast administration. CONTRAST:  138mL OMNIPAQUE IOHEXOL 300 MG/ML  SOLN COMPARISON:  CT chest April 24, 2019 FINDINGS: Cardiovascular: Normal heart size. No significant pericardial fluid/thickening. Coronary artery calcifications are seen. Scattered aortic atherosclerosis is noted. 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 No evidence of acute thoracic aortic injury. No central pulmonary emboli. Mediastinum/Nodes: No pneumomediastinum. No mediastinal hematoma. Unremarkable esophagus. No axillary, mediastinal or hilar lymphadenopathy. Lungs/Pleura:There is a new small area of patchy/ground-glass opacity seen in the periphery of the left lower lung, series 3, image 37. There is bilateral areas  subpleural reticulonodular opacities and small amount of honeycombing seen peripherally. Biapical scarring is noted with pleural calcification. This appears to be unchanged since the prior exam. No pneumothorax. No pleural effusion. Musculoskeletal: There is nondisplaced fractures of the posterior left fifth, 6, 8, 9, and tenth left ribs. There is unchanged slight superior compression deformities of the T5, T6, T10 vertebral bodies with less than 25% loss in height. Abdomen/pelvis: Hepatobiliary: Homogeneous hepatic attenuation without traumatic injury. No focal lesion. The patient is status post cholecystectomy. No biliary ductal dilation. Pancreas: No evidence for traumatic injury. Portions are partially obscured by adjacent bowel loops and paucity of intra-abdominal fat. No ductal dilatation or inflammation. Spleen: Homogeneous attenuation without traumatic injury. Normal in size. Adrenals/Urinary Tract: No adrenal hemorrhage. Kidneys demonstrate symmetric enhancement and excretion on delayed phase imaging. No evidence or renal injury. Ureters are well opacified proximal through mid portion. Bladder is physiologically distended without wall thickening. Stomach/Bowel: Suboptimally assessed without enteric contrast, allowing for this, no evidence of bowel injury. Stomach physiologically distended. There are no dilated or thickened small or large bowel loops. Moderate stool burden. No evidence of mesenteric hematoma. No free air free fluid. Vascular/Lymphatic: No acute vascular injury. The abdominal aorta and IVC are intact. No evidence of retroperitoneal, abdominal, or pelvic adenopathy. Scattered aortic atherosclerotic calcifications are seen without aneurysmal dilatation. Reproductive: No acute abnormality. Other: No focal contusion or abnormality of the abdominal wall. Musculoskeletal: No acute fracture of the lumbar spine or bony pelvis. IMPRESSION: 1. Nondisplaced posterior left fifth, sixth, eighth, ninth,  and tenth rib fractures. 2. New ground-glass/patchy airspace opacity in the periphery of the left lower lung which could be due to atelectasis and/or contusion. 3. Stable findings suggestive of chronic fibrotic interstitial lung disease as seen on prior dating back to April 24, 2019. 4.  Aortic Atherosclerosis (ICD10-I70.0). 5. Chronic slight superior compression deformities of T5, T6, and T10 with less than 25% loss in vertebral body height. Electronically Signed   By: Prudencio Pair M.D.   On: 07/04/2019 00:31   CT ABDOMEN PELVIS W CONTRAST  Result Date: 07/04/2019 CLINICAL DATA:  Left flank pain, after fall question trauma EXAM: CT CHEST, abdomen, and pelvis WITH CONTRAST TECHNIQUE: Multidetector CT imaging of the chest was performed during intravenous contrast administration. CONTRAST:  133mL OMNIPAQUE IOHEXOL 300 MG/ML  SOLN COMPARISON:  CT chest April 24, 2019 FINDINGS: Cardiovascular: Normal heart size. No significant pericardial fluid/thickening. Coronary artery calcifications are seen. Scattered aortic atherosclerosis is noted. 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 No evidence of acute thoracic aortic injury. No  central pulmonary emboli. Mediastinum/Nodes: No pneumomediastinum. No mediastinal hematoma. Unremarkable esophagus. No axillary, mediastinal or hilar lymphadenopathy. Lungs/Pleura:There is a new small area of patchy/ground-glass opacity seen in the periphery of the left lower lung, series 3, image 37. There is bilateral areas subpleural reticulonodular opacities and small amount of honeycombing seen peripherally. Biapical scarring is noted with pleural calcification. This appears to be unchanged since the prior exam. No pneumothorax. No pleural effusion. Musculoskeletal: There is nondisplaced fractures of the posterior left fifth, 6, 8, 9, and tenth left ribs. There is unchanged slight superior compression deformities of the T5, T6, T10  vertebral bodies with less than 25% loss in height. Abdomen/pelvis: Hepatobiliary: Homogeneous hepatic attenuation without traumatic injury. No focal lesion. The patient is status post cholecystectomy. No biliary ductal dilation. Pancreas: No evidence for traumatic injury. Portions are partially obscured by adjacent bowel loops and paucity of intra-abdominal fat. No ductal dilatation or inflammation. Spleen: Homogeneous attenuation without traumatic injury. Normal in size. Adrenals/Urinary Tract: No adrenal hemorrhage. Kidneys demonstrate symmetric enhancement and excretion on delayed phase imaging. No evidence or renal injury. Ureters are well opacified proximal through mid portion. Bladder is physiologically distended without wall thickening. Stomach/Bowel: Suboptimally assessed without enteric contrast, allowing for this, no evidence of bowel injury. Stomach physiologically distended. There are no dilated or thickened small or large bowel loops. Moderate stool burden. No evidence of mesenteric hematoma. No free air free fluid. Vascular/Lymphatic: No acute vascular injury. The abdominal aorta and IVC are intact. No evidence of retroperitoneal, abdominal, or pelvic adenopathy. Scattered aortic atherosclerotic calcifications are seen without aneurysmal dilatation. Reproductive: No acute abnormality. Other: No focal contusion or abnormality of the abdominal wall. Musculoskeletal: No acute fracture of the lumbar spine or bony pelvis. IMPRESSION: 1. Nondisplaced posterior left fifth, sixth, eighth, ninth, and tenth rib fractures. 2. New ground-glass/patchy airspace opacity in the periphery of the left lower lung which could be due to atelectasis and/or contusion. 3. Stable findings suggestive of chronic fibrotic interstitial lung disease as seen on prior dating back to April 24, 2019. 4.  Aortic Atherosclerosis (ICD10-I70.0). 5. Chronic slight superior compression deformities of T5, T6, and T10 with less than 25%  loss in vertebral body height. Electronically Signed   By: Prudencio Pair M.D.   On: 07/04/2019 00:31   NSR at 70, nl axis, nl int, no st-t changes c/w ischemia    Assessment & Plan:    Principal Problem:   Rib fractures Active Problems:   ILD (interstitial lung disease) (HCC)   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HCC)  Rib fracture, multiple DC Effexor  -> Start cymbalta 30mg  po bid for pain control Cont Gabapentin 600mg  po at bedtime, consider increasing dose Start Norco 5/325mg  po bid, and q6h prn Start Senokot 1 po bid for constipation  H/o constipation Start Senokot as above miralax 17gm po qday prn  ILD Cont Dulera 2puff bid Cont Tessalon Pearls Cont Duoneb q6h prn   Hyperlipidemia Cont Pravastatin 20mg  po at bedtime  RA Cont Rituxan as outpatient  Gerd Cont PPI  Anxiety Cont Xanax 0.25mg  po tid prn    DVT Prophylaxis-   Lovenox - SCDs   AM Labs Ordered, also please review Full Orders  Family Communication: Admission, patients condition and plan of care including tests being ordered have been discussed with the patient  who indicate understanding and agree with the plan and Code Status.  Code Status:  FULL CODE per patient  Admission status: Observation, : Based on patients clinical presentation and evaluation of  above clinical data, I have made determination that patient meets observation criteria at this time.   Time spent in minutes : 70 minutes   Jani Gravel M.D on 07/04/2019 at 2:15 AM

## 2019-07-05 DIAGNOSIS — J849 Interstitial pulmonary disease, unspecified: Secondary | ICD-10-CM

## 2019-07-05 DIAGNOSIS — M069 Rheumatoid arthritis, unspecified: Secondary | ICD-10-CM

## 2019-07-05 DIAGNOSIS — Y92009 Unspecified place in unspecified non-institutional (private) residence as the place of occurrence of the external cause: Secondary | ICD-10-CM

## 2019-07-05 DIAGNOSIS — F329 Major depressive disorder, single episode, unspecified: Secondary | ICD-10-CM

## 2019-07-05 DIAGNOSIS — I951 Orthostatic hypotension: Secondary | ICD-10-CM

## 2019-07-05 DIAGNOSIS — S2242XA Multiple fractures of ribs, left side, initial encounter for closed fracture: Secondary | ICD-10-CM

## 2019-07-05 DIAGNOSIS — J9621 Acute and chronic respiratory failure with hypoxia: Secondary | ICD-10-CM

## 2019-07-05 DIAGNOSIS — W19XXXA Unspecified fall, initial encounter: Secondary | ICD-10-CM

## 2019-07-05 NOTE — Progress Notes (Signed)
PROGRESS NOTE    Stacey [name]  [id number]  DOB: 12-23-1940  PCP: Shon Baton, MD 78 year old female with history of HTN, depression, GERD, rheumatoid arthritis, interstitial lung disease who is on chronic O2 2 L via nasal cannula presented after a fall.  Of note patient tested positive for COVID-19 on 11/18 and recently admitted for Covid pneumonia on 11/29, treated with remdesivir/Decadron. Patient states her blood pressure medications were recently adjusted (changed to Cardizem few weeks back) after which she was feeling wobbly and lightheaded.  On PCP follow-up she was told to discontinue blood pressure medications as her blood pressure was low normal with systolic in the 0000000.  She states she felt better after but not quite to baseline.  Yesterday she again felt "off balance" and sustained a fall resulting in multiple rib fractures (Nondisplaced posterior left fifth, sixth, eighth, ninth, and tenth rib fractures).  CT chest/abdomen/pelvis did not reveal any other traumatic findings.  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  Patient admitted to [facility] service for pain control.  She received IV fentanyl, Solu-Medrol and nebulizer treatments while in ED.   Subjective:  Patient resting comfortably.  Denies any acute complaints but noted to be on 5 L O2.  Noted to be orthostatic with supine systolic blood pressure in 140s and standing systolic blood pressure in 90s.  Repeat Covid testing came back positive and placed on contact/airborne precautions overnight.  Objective: Vitals:   07/05/19 0652 07/05/19 0918 07/05/19 1323 07/05/19 1427  BP:   96/77 (!) 113/58  Pulse:   75 76  Resp:   16   Temp:   97.7 F (36.5 C)   TempSrc:   Oral   SpO2:  99% 98% 99%  Weight: 62.4 kg     Height: 5\' 6"  (1.676 m)       Intake/Output Summary (Last 24 hours) at 07/05/2019  1727 Last data filed at 07/05/2019 1329 Gross per 24 hour  Intake 840 ml  Output 1850 ml  Net -1010 ml   Filed Weights   07/03/19 1332 07/05/19 0652  Weight: 61.2 kg 62.4 kg    Physical Examination:  General exam: Appears calm and comfortable  Respiratory system: Clear to auscultation except mild bibasilar crackles. Respiratory effort normal. Cardiovascular system: S1 & S2 heard, RRR. No JVD, murmurs, rubs, gallops or clicks. No pedal edema. Gastrointestinal system: Abdomen is nondistended, soft and nontender. Normal bowel sounds heard. Central nervous system: Alert and oriented. No new focal neurological deficits. Extremities: No contractures, edema or joint deformities.  Skin: No rashes, lesions or ulcers Psychiatry: Judgement and insight appear normal. Mood & affect appropriate.   Data Reviewed: I have personally reviewed following labs and imaging studies  CBC: Recent Labs  Lab 07/03/19 2231 07/04/19 0427  WBC 7.7 7.1  NEUTROABS 4.3  --   HGB 12.1 11.9*  HCT 38.5 37.2  MCV 92.5 90.3  PLT 244 0000000   Basic Metabolic Panel: Recent Labs  Lab 07/03/19 2231 07/04/19 0427  NA 133* 134*  K 4.7 4.5  CL 95* 98  CO2 29 27  GLUCOSE 97 129*  BUN 10 11  CREATININE 0.72 0.66  CALCIUM 8.9 8.9   GFR: Estimated Creatinine Clearance: 53.4 mL/min (by C-G formula based on SCr of 0.66 mg/dL). Liver Function Tests: Recent Labs  Lab 07/03/19 2231 07/04/19 0427  AST 19 17  ALT 17 17  ALKPHOS 78  74  BILITOT 0.5 0.4  PROT 6.6 6.0*  ALBUMIN 3.3* 3.0*   Recent Labs  Lab 07/03/19 2231  LIPASE 26   No results for input(s): AMMONIA in the last 168 hours. Coagulation Profile: No results for input(s): INR, PROTIME in the last 168 hours. Cardiac Enzymes: No results for input(s): CKTOTAL, CKMB, CKMBINDEX, TROPONINI in the last 168 hours. BNP (last 3 results) No results for input(s): PROBNP in the last 8760 hours. HbA1C: No results for input(s): HGBA1C in the last 72  hours. CBG: No results for input(s): GLUCAP in the last 168 hours. Lipid Profile: No results for input(s): CHOL, HDL, LDLCALC, TRIG, CHOLHDL, LDLDIRECT in the last 72 hours. Thyroid Function Tests: No results for input(s): TSH, T4TOTAL, FREET4, T3FREE, THYROIDAB in the last 72 hours. Anemia Panel: No results for input(s): VITAMINB12, FOLATE, FERRITIN, TIBC, IRON, RETICCTPCT in the last 72 hours. Sepsis Labs: Recent Labs  Lab 07/03/19 2231  LATICACIDVEN 0.9    Recent Results (from the past 240 hour(s))  SARS CORONAVIRUS 2 (TAT 6-24 HRS) Nasopharyngeal Nasopharyngeal Swab     Status: Abnormal   Collection Time: 07/04/19  3:30 PM   Specimen: Nasopharyngeal Swab  Result Value Ref Range Status   SARS Coronavirus 2 POSITIVE (A) NEGATIVE Final    Comment: RESULT CALLED TO, READ BACK BY AND VERIFIED WITH: P LITKA,RN 2328 07/02/2019 D BRADLEY (NOTE) SARS-CoV-2 target nucleic acids are DETECTED. The SARS-CoV-2 RNA is generally detectable in upper and lower respiratory specimens during the acute phase of infection. Positive results are indicative of the presence of SARS-CoV-2 RNA. Clinical correlation with patient history and other diagnostic information is  necessary to determine patient infection status. Positive results do not rule out bacterial infection or co-infection with other viruses.  The expected result is Negative. Fact Sheet for Patients: SugarRoll.be Fact Sheet for Healthcare Providers: https://www.woods-mathews.com/ This test is not yet approved or cleared by the [place] FDA and  has been authorized for detection and/or diagnosis of SARS-CoV-2 by FDA under an Emergency Use Authorization (EUA). This EUA will remain  in effect (meaning this test can be used) for th e duration of the COVID-19 declaration under Section 564(b)(1) of the Act, 21 U.S.C. section 360bbb-3(b)(1), unless the authorization is terminated or revoked  sooner. Performed at [facility], [place] [address], [place], [place] [address]       Radiology Studies: CT Chest W Contrast  Result Date: 07/04/2019 CLINICAL DATA:  Left flank pain, after fall question trauma EXAM: CT CHEST, abdomen, and pelvis WITH CONTRAST TECHNIQUE: Multidetector CT imaging of the chest was performed during intravenous contrast administration. CONTRAST:  193mL OMNIPAQUE IOHEXOL 300 MG/ML  SOLN COMPARISON:  CT chest April 24, 2019 FINDINGS: Cardiovascular: Normal heart size. No significant pericardial fluid/thickening. Coronary artery calcifications are seen. Scattered aortic atherosclerosis is noted. 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 No evidence of acute thoracic aortic injury. No central pulmonary emboli. Mediastinum/Nodes: No pneumomediastinum. No mediastinal hematoma. Unremarkable esophagus. No axillary, mediastinal or hilar lymphadenopathy. Lungs/Pleura:There is a new small area of patchy/ground-glass opacity seen in the periphery of the left lower lung, series 3, image 37. There is bilateral areas subpleural reticulonodular opacities and small amount of honeycombing seen peripherally. Biapical scarring is noted with pleural calcification. This appears to be unchanged since the prior exam. No pneumothorax. No pleural effusion. Musculoskeletal: There is nondisplaced fractures of the posterior left fifth, 6, 8, 9, and tenth left ribs. There  is unchanged slight superior compression deformities of the T5, T6, T10 vertebral bodies with less than 25% loss in height. Abdomen/pelvis: Hepatobiliary: Homogeneous hepatic attenuation without traumatic injury. No focal lesion. The patient is status post cholecystectomy. No biliary ductal dilation. Pancreas: No evidence for traumatic injury. Portions are partially obscured by adjacent bowel loops and paucity of intra-abdominal fat. No ductal dilatation or inflammation. Spleen:  Homogeneous attenuation without traumatic injury. Normal in size. Adrenals/Urinary Tract: No adrenal hemorrhage. Kidneys demonstrate symmetric enhancement and excretion on delayed phase imaging. No evidence or renal injury. Ureters are well opacified proximal through mid portion. Bladder is physiologically distended without wall thickening. Stomach/Bowel: Suboptimally assessed without enteric contrast, allowing for this, no evidence of bowel injury. Stomach physiologically distended. There are no dilated or thickened small or large bowel loops. Moderate stool burden. No evidence of mesenteric hematoma. No free air free fluid. Vascular/Lymphatic: No acute vascular injury. The abdominal aorta and IVC are intact. No evidence of retroperitoneal, abdominal, or pelvic adenopathy. Scattered aortic atherosclerotic calcifications are seen without aneurysmal dilatation. Reproductive: No acute abnormality. Other: No focal contusion or abnormality of the abdominal wall. Musculoskeletal: No acute fracture of the lumbar spine or bony pelvis. IMPRESSION: 1. Nondisplaced posterior left fifth, sixth, eighth, ninth, and tenth rib fractures. 2. New ground-glass/patchy airspace opacity in the periphery of the left lower lung which could be due to atelectasis and/or contusion. 3. Stable findings suggestive of chronic fibrotic interstitial lung disease as seen on prior dating back to April 24, 2019. 4.  Aortic Atherosclerosis (ICD10-I70.0). 5. Chronic slight superior compression deformities of T5, T6, and T10 with less than 25% loss in vertebral body height. Electronically Signed   By: Prudencio Pair M.D.   On: 07/04/2019 00:31   CT ABDOMEN PELVIS W CONTRAST  Result Date: 07/04/2019 CLINICAL DATA:  Left flank pain, after fall question trauma EXAM: CT CHEST, abdomen, and pelvis WITH CONTRAST TECHNIQUE: Multidetector CT imaging of the chest was performed during intravenous contrast administration. CONTRAST:  194mL OMNIPAQUE IOHEXOL  300 MG/ML  SOLN COMPARISON:  CT chest April 24, 2019 FINDINGS: Cardiovascular: Normal heart size. No significant pericardial fluid/thickening. Coronary artery calcifications are seen. Scattered aortic atherosclerosis is noted. 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 No evidence of acute thoracic aortic injury. No central pulmonary emboli. Mediastinum/Nodes: No pneumomediastinum. No mediastinal hematoma. Unremarkable esophagus. No axillary, mediastinal or hilar lymphadenopathy. Lungs/Pleura:There is a new small area of patchy/ground-glass opacity seen in the periphery of the left lower lung, series 3, image 37. There is bilateral areas subpleural reticulonodular opacities and small amount of honeycombing seen peripherally. Biapical scarring is noted with pleural calcification. This appears to be unchanged since the prior exam. No pneumothorax. No pleural effusion. Musculoskeletal: There is nondisplaced fractures of the posterior left fifth, 6, 8, 9, and tenth left ribs. There is unchanged slight superior compression deformities of the T5, T6, T10 vertebral bodies with less than 25% loss in height. Abdomen/pelvis: Hepatobiliary: Homogeneous hepatic attenuation without traumatic injury. No focal lesion. The patient is status post cholecystectomy. No biliary ductal dilation. Pancreas: No evidence for traumatic injury. Portions are partially obscured by adjacent bowel loops and paucity of intra-abdominal fat. No ductal dilatation or inflammation. Spleen: Homogeneous attenuation without traumatic injury. Normal in size. Adrenals/Urinary Tract: No adrenal hemorrhage. Kidneys demonstrate symmetric enhancement and excretion on delayed phase imaging. No evidence or renal injury. Ureters are well opacified proximal through mid portion. Bladder is physiologically distended without wall thickening.  Stomach/Bowel: Suboptimally assessed without enteric contrast, allowing for  this, no evidence of bowel injury. Stomach physiologically distended. There are no dilated or thickened small or large bowel loops. Moderate stool burden. No evidence of mesenteric hematoma. No free air free fluid. Vascular/Lymphatic: No acute vascular injury. The abdominal aorta and IVC are intact. No evidence of retroperitoneal, abdominal, or pelvic adenopathy. Scattered aortic atherosclerotic calcifications are seen without aneurysmal dilatation. Reproductive: No acute abnormality. Other: No focal contusion or abnormality of the abdominal wall. Musculoskeletal: No acute fracture of the lumbar spine or bony pelvis. IMPRESSION: 1. Nondisplaced posterior left fifth, sixth, eighth, ninth, and tenth rib fractures. 2. New ground-glass/patchy airspace opacity in the periphery of the left lower lung which could be due to atelectasis and/or contusion. 3. Stable findings suggestive of chronic fibrotic interstitial lung disease as seen on prior dating back to April 24, 2019. 4.  Aortic Atherosclerosis (ICD10-I70.0). 5. Chronic slight superior compression deformities of T5, T6, and T10 with less than 25% loss in vertebral body height. Electronically Signed   By: Prudencio Pair M.D.   On: 07/04/2019 00:31        Scheduled Meds: . albuterol  2 puff Inhalation Once  . amitriptyline  25 mg Oral QHS  . cholecalciferol  1,000 Units Oral Daily  . dextromethorphan  15 mg Oral QHS  . DULoxetine  30 mg Oral BID  . enoxaparin (LOVENOX) injection  40 mg Subcutaneous Daily  . gabapentin  600 mg Oral QHS  . HYDROcodone-acetaminophen  1 tablet Oral BID  . lidocaine  1 patch Transdermal Q24H  . mometasone-formoterol  2 puff Inhalation BID  . multivitamin  1 tablet Oral Daily  . pantoprazole  40 mg Oral BID AC  . pravastatin  20 mg Oral QPM  . senna-docusate  1 tablet Oral BID  . sodium chloride flush  3 mL Intravenous Q12H   Continuous Infusions: . sodium chloride      Assessment & Plan:   1.  Orthostasis/fall:  Patient reports dizziness with position changes causing her to lose balance prior to the fall.  She was recently evaluated by PCP and told to discontinue home antihypertensives including Cardizem and concern for low blood pressure noted in office visit.  Orthostatic blood pressure checks do show significant change.  Advised support stockings.  Will allow supine hypertension to maintain standing blood pressure and adequate cerebral perfusion.  Can consider adding Florinef if continues to be orthostatic with symptoms.  PT evaluation requested and they recommend home PT  2.  Multiple rib fractures: As a consequence of fall.CT chest/abdomen/pelvis did not reveal any other traumatic findings.  Continue pain medications.  Patient states lidocaine patch and oral hydrocodone helping.  Also on Neurontin at baseline.  Not requiring much of IV medications today.  3.  Acute on chronic hypoxic respiratory failure: Likely secondary to hypoventilation in the setting of rib fractures. 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  Patient currently on 5 L O2 via nasal cannula while at home she uses 2 to 3 L.  Will taper O2 as tolerated overnight and encourage incentive spirometry.   4.  Interstitial lung disease and recent COVID-19 infection: No wheezing on lung exam.  Continue home inhaler therapy, nebs as needed.  COVID-19 test resulted positive but likely related to recent infection (within 90 days).  Discussed with ID curbside who did not feel patient needed airborne precautions.  5.  Rheumatoid arthritis: Previously on Rituxan injections.  (?  Not getting since March 2020) it appears that patient was also on methylprednisolone 2 mg alternating with 4 mg, unclear if this was discontinued--resumed  6.  GERD: Resume PPI  7.  Depression: Resumed home meds    DVT prophylaxis: Lovenox Code  Status: Full code Family / Patient Communication: Discussed with patient and answered all questions to satisfaction Disposition Plan: Home likely in a.m. with home PT     LOS: 1 day    Time spent: 35 minutes    Guilford Shi, MD [facility] Pager [phone]  If 7PM-7AM, please contact night-coverage [email or web address] Password [facility] 07/05/2019, 5:27 PM

## 2019-07-05 NOTE — Evaluation (Signed)
Physical Therapy Evaluation Patient Details Name: Stacey [name] MRN: [id number] DOB: 11-Sep-1940 Today's Date: 07/05/2019   History of Present Illness  78 yo female admitted with L rib fractures after falling at home. Tested (+) for COVID on 05/31/19. Hx of ILD(wears O2 PRN), RA  Clinical Impression  On eval, pt was Min guard assist for mobility. She walked ~50 feet around room with use of a RW. Mildly unsteady but no overt LOB. Unable to get accurate pulse ox readings during session. Pt denied dyspnea with activity. Will continue to follow. Recommend HHPT f/u.     Follow Up Recommendations Home health PT    Equipment Recommendations  None recommended by PT    Recommendations for Other Services       Precautions / Restrictions Precautions Precautions: Fall Precaution Comments: monitor O2 Restrictions Weight Bearing Restrictions: No      Mobility  Bed Mobility Overal bed mobility: Needs Assistance Bed Mobility: Supine to Sit;Sit to Supine     Supine to sit: Supervision;HOB elevated Sit to supine: Supervision;HOB elevated   General bed mobility comments: Increased time.  Transfers Overall transfer level: Needs assistance Equipment used: Rolling walker (2 wheeled) Transfers: Sit to/from Stand Sit to Stand: Min guard         General transfer comment: Close guard for safety. VCs hand placement.  Ambulation/Gait Ambulation/Gait assistance: Min guard Gait Distance (Feet): 50 Feet Assistive device: Rolling walker (2 wheeled) Gait Pattern/deviations: Step-through pattern;Decreased stride length     General Gait Details: Close guard for safety. Unable to get accurate pulse ox readings during activity.  Stairs            Wheelchair Mobility    Modified Rankin (Stroke Patients Only)       Balance Overall balance assessment: Needs assistance;History of Falls           Standing balance-Leahy Scale: Fair                                Pertinent Vitals/Pain Pain Assessment: Faces Faces Pain Scale: Hurts even more Pain Location: L side of trunk Pain Descriptors / Indicators: Sharp;Discomfort;Tender Pain Intervention(s): Monitored during session;Repositioned    Home Living Family/patient expects to be discharged to:: Private residence Living Arrangements: Spouse/significant other Available Help at Discharge: Family Type of Home: House Home Access: Stairs to enter Entrance Stairs-Rails: None Entrance Stairs-Number of Steps: 2 Home Layout: Two level;Able to live on main level with bedroom/bathroom Home Equipment: Shower seat - built in;Cane - single point;Walker - 4 wheels;Bedside commode      Prior Function Level of Independence: Independent with assistive device(s)         Comments: using rollator for mobility     Hand Dominance        Extremity/Trunk Assessment   Upper Extremity Assessment Upper Extremity Assessment: Overall WFL for tasks assessed    Lower Extremity Assessment Lower Extremity Assessment: Generalized weakness    Cervical / Trunk Assessment Cervical / Trunk Assessment: Kyphotic  Communication   Communication: No difficulties  Cognition Arousal/Alertness: Awake/alert Behavior During Therapy: WFL for tasks assessed/performed Overall Cognitive Status: Within Functional Limits for tasks assessed                                        General Comments      Exercises  Assessment/Plan    PT Assessment Patient needs continued PT services  PT Problem List Decreased mobility;Decreased activity tolerance;Decreased balance;Decreased knowledge of use of DME;Pain       PT Treatment Interventions DME instruction;Gait training;Therapeutic activities;Patient/family education;Balance training;Functional mobility training    PT Goals (Current goals can be found in the Care Plan section)  Acute Rehab PT Goals Patient Stated Goal: home PT Goal Formulation:  With patient Time For Goal Achievement: 07/20/19 Potential to Achieve Goals: Good    Frequency Min 3X/week   Barriers to discharge        Co-evaluation               AM-PAC PT "6 Clicks" Mobility  Outcome Measure Help needed turning from your back to your side while in a flat bed without using bedrails?: A Little Help needed moving from lying on your back to sitting on the side of a flat bed without using bedrails?: A Little Help needed moving to and from a bed to a chair (including a wheelchair)?: A Little Help needed standing up from a chair using your arms (e.g., wheelchair or bedside chair)?: A Little Help needed to walk in hospital room?: A Little Help needed climbing 3-5 steps with a railing? : A Little 6 Click Score: 18    End of Session Equipment Utilized During Treatment: Oxygen Activity Tolerance: Patient tolerated treatment well Patient left: in bed;with call bell/phone within reach;with bed alarm set   PT Visit Diagnosis: Unsteadiness on feet (R26.81);History of falling (Z91.81)    Time: 1420-1501 PT Time Calculation (min) (ACUTE ONLY): 41 min   Charges:   PT Evaluation $PT Eval Moderate Complexity: 1 Mod PT Treatments $Gait Training: 8-22 mins $Therapeutic Activity: 8-22 mins        Doreatha Massed, PT Acute Rehabilitation Services

## 2019-07-06 DIAGNOSIS — W19XXXA Unspecified fall, initial encounter: Secondary | ICD-10-CM

## 2019-07-06 DIAGNOSIS — I951 Orthostatic hypotension: Secondary | ICD-10-CM

## 2019-07-06 DIAGNOSIS — J9621 Acute and chronic respiratory failure with hypoxia: Secondary | ICD-10-CM

## 2019-07-06 MED ORDER — SENNOSIDES-DOCUSATE SODIUM 8.6-50 MG PO TABS: 1.0000 | ORAL_TABLET | Freq: Two times a day (BID) | ORAL | 0 refills | Status: AC

## 2019-07-06 MED ORDER — LIDOCAINE 5 % EX PTCH: 1.0000 | MEDICATED_PATCH | CUTANEOUS | 0 refills | Status: DC

## 2019-07-06 MED ORDER — HYDROCODONE-ACETAMINOPHEN 5-325 MG PO TABS: 1.0000 | ORAL_TABLET | Freq: Four times a day (QID) | ORAL | 0 refills | Status: AC | PRN

## 2019-07-06 NOTE — TOC Initial Note (Signed)
Transition of Care [facility]) - Initial/Assessment Note    Patient Details  Name: Stacey [name] MRN: [id number] Date of Birth: 11/01/1940  Transition of Care [facility]) CM/SW Contact:    Wende Neighbors, LCSW Phone Number: 07/06/2019, 10:22 AM  Clinical Narrative:    CSW spoke with patient via phone. Patient stated she lives at home with spouse. Patient agreeable to have Home health physical therapy follow her in the home. Patient stated she does not care switch agency follows her in the home but stated she just wants an agency that is in network with insurance. Patient stated he husband will be picking her up at discharge and CSW encouraged patient to ask family to bring oxygen tank with them.  Patient is set up with [facility] for physical therapy             Expected Discharge Plan: [place] Barriers to Discharge: No Barriers Identified   Patient Goals and CMS Choice Patient states their goals for this hospitalization and ongoing recovery are:: to be with family CMS Medicare.gov Compare Post Acute Care list provided to:: Patient Choice offered to / list presented to : Patient  Expected Discharge Plan and Services Expected Discharge Plan: [place] In-house Referral: Clinical Social Work   Post Acute Care Choice: [place] arrangements for the past 2 months: [place] Expected Discharge Date: 07/06/19                         [facility] Arranged: PT          Prior Living Arrangements/Services Living arrangements for the past 2 months: [place] Lives with:: Self, Spouse Patient language and need for interpreter reviewed:: Yes Do you feel safe going back to the place where you live?: Yes      Need for Family Participation in Patient Care: Yes (Comment) Care giver support system in place?: Yes (comment)   Criminal Activity/Legal Involvement Pertinent to Current Situation/Hospitalization: No - Comment as  needed  Activities of Daily Living Home Assistive Devices/Equipment: Built-in shower seat, Bedside commode/3-in-1, Cane (specify quad or straight), Nebulizer, Oxygen, Walker (specify type)(single point cane, 4 wheeled walker) ADL Screening (condition at time of admission) Patient's cognitive ability adequate to safely complete daily activities?: Yes Is the patient deaf or have difficulty hearing?: No Does the patient have difficulty seeing, even when wearing glasses/contacts?: No Does the patient have difficulty concentrating, remembering, or making decisions?: No Patient able to express need for assistance with ADLs?: Yes Does the patient have difficulty dressing or bathing?: No Independently performs ADLs?: Yes (appropriate for developmental age) Does the patient have difficulty walking or climbing stairs?: Yes(secondary to shortness of breath) Weakness of Legs: Both Weakness of Arms/Hands: None  Permission Sought/Granted Permission sought to share information with : Family Supports Permission granted to share information with : Yes, Verbal Permission Granted  Share Information with NAME: ned Lindeman     Permission granted to share info w Relationship: spouse  Permission granted to share info w Contact Information: [phone]  Emotional Assessment Appearance:: Appears stated age   Affect (typically observed): Stable Orientation: : Oriented to Self, Oriented to Place, Oriented to  Time, Oriented to Situation Alcohol / Substance Use: Not Applicable Psych Involvement: No (comment)  Admission diagnosis:  Shortness of breath [R06.02] Rib fractures [S22.39XA] Hypoxia [R09.02] Closed fracture of multiple ribs of left side, initial encounter [S22.42XA] Contusion of left lung, initial encounter [  S27.321A] Left-sided chest pain [R07.9] COVID-19 [U07.1] Patient Active Problem List   Diagnosis Date Noted  . Acute on chronic respiratory failure with hypoxia ([place]) 07/06/2019  . Fall at  home, initial encounter 07/06/2019  . Orthostatic hypotension 07/06/2019  . Rib fractures 07/04/2019  . 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[place]) 05/07/2019  . Acute asthma exacerbation 12/18/2018  . Elevated troponin 12/18/2018  . Acute bronchitis 08/30/2018  . Chronic respiratory failure with hypoxia ([place]) 04/06/2018  . Essential hypertension 03/24/2018  . Depression 03/24/2018  . Asthma exacerbation 03/23/2018  . DOE (dyspnea on exertion) 03/21/2018  . 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 esophagitis 03/21/2018  . Chronic lung disease   . Hypoxia   . Dyspnea 01/23/2018  . ILD (interstitial lung disease) ([place]) 01/23/2018  . 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 Multiple pulmonary nodules/RA lung dz  05/29/2013  . Cough variant asthma vs uacs  05/29/2013  . Rheumatoid arthritis ([place]) 01/13/2012  . Osteopenia 01/13/2012  . Lichen sclerosus et atrophicus of the vulva 01/13/2012  . Asthma 01/13/2012   PCP:  Shon Baton, MD Pharmacy:   [place], [address] [place] [address] Phone: [phone] Fax: [place], [place] [address] Phone: [phone] Fax: [phone]     Social Determinants of Health (SDOH) Interventions    Readmission Risk Interventions No flowsheet data found.

## 2019-07-06 NOTE — Evaluation (Signed)
Occupational Therapy Evaluation Patient Details Name: Stacey [name] MRN: [id number] DOB: 11/18/1940 Today's Date: 07/06/2019    History of Present Illness 78 yo female admitted with L rib fractures after falling at home. Tested (+) for COVID on 05/31/19. Hx of ILD(wears O2 PRN), RA   Clinical Impression   Pt admitted with L rib fractures. Pt currently with functional limitations due to the deficits listed below (see OT Problem List).  Pt will benefit from skilled OT to increase their safety and independence with ADL and functional mobility for ADL to facilitate discharge to venue listed below.   Educated pt on having family A her with ADL activity for safety as well as monitoring her oxygen levels with her home oxygen monitor.  Pts sats decreased to 85 with activity but did come back up with VC to take deep breathes.  OT did speak with pts daughter regarding OT eval and need for some A at home.  Also educated pt to stand and wait before walking in case of dizziness to prevent a fall.  Pt verbalized understanding.    Follow Up Recommendations  No OT follow up;Supervision/Assistance - 24 hour    Equipment Recommendations  None recommended by OT    Recommendations for Other Services       Precautions / Restrictions Precautions Precautions: Fall Precaution Comments: monitor O2 Restrictions Weight Bearing Restrictions: No(no)      Mobility Bed Mobility Overal bed mobility: Needs Assistance Bed Mobility: Supine to Sit;Sit to Supine     Supine to sit: Supervision;HOB elevated Sit to supine: Supervision;HOB elevated   General bed mobility comments: Increased time.  Transfers Overall transfer level: Needs assistance Equipment used: Rolling walker (2 wheeled) Transfers: Sit to/from Stand Sit to Stand: Min guard         General transfer comment: Close guard for safety. VCs hand placement.    Balance Overall balance assessment: Needs assistance;History of Falls            Standing balance-Leahy Scale: Fair                             ADL either performed or assessed with clinical judgement   ADL Overall ADL's : Needs assistance/impaired Eating/Feeding: Modified independent;Sitting   Grooming: Oral care;Supervision/safety;Standing   Upper Body Bathing: Set up;Min guard;Sitting   Lower Body Bathing: Min guard;Sit to/from stand   Upper Body Dressing : Set up;Sitting   Lower Body Dressing: Min guard;Sit to/from stand Lower Body Dressing Details (indicate cue type and reason): donning underwear while seated on toilet in bathroom Toilet Transfer: Supervision/safety;Grab bars;BSC;Stand-pivot;Cueing for safety Toilet Transfer Details (indicate cue type and reason): amb with rollator Toileting- Clothing Manipulation and Hygiene: Min guard;Supervision/safety;Sit to/from stand Toileting - Clothing Manipulation Details (indicate cue type and reason): pt performing pericare/clothing management with minguard-supervision for safety     Functional mobility during ADLs: Supervision/safety(rollator)                    Pertinent Vitals/Pain Pain Assessment: No/denies pain        Extremity/Trunk Assessment         Cervical / Trunk Assessment Cervical / Trunk Assessment: Kyphotic   Communication Communication Communication: No difficulties   Cognition Arousal/Alertness: Awake/alert Behavior During Therapy: WFL for tasks assessed/performed Overall Cognitive Status: Within Functional Limits for tasks assessed  Home Living Family/patient expects to be discharged to:: Private residence Living Arrangements: Spouse/significant other Available Help at Discharge: Family Type of Home: House Home Access: Stairs to enter [employer] of Steps: 2 Entrance Stairs-Rails: None Home Layout: Two level;Able to live on main level with bedroom/bathroom Alternate Level  Stairs-Number of Steps: flight   Bathroom Shower/Tub: Occupational psychologist: Handicapped height     Home Equipment: Shower seat - built in;Cane - single point;Walker - 4 wheels;Bedside commode          Prior Functioning/Environment Level of Independence: Independent with assistive device(s)        Comments: using rollator for mobility              OT Treatment/Interventions: Self-care/ADL training;Therapeutic exercise;Energy conservation;DME and/or AE instruction;Therapeutic activities;Patient/family education;Balance training    OT Goals(Current goals can be found in the care plan section) Acute Rehab OT Goals Patient Stated Goal: home today OT Goal Formulation: With patient Time For Goal Achievement: 07/06/19 Potential to Achieve Goals: Good  OT Frequency: Min 2X/week   Barriers to D/C:               AM-PAC OT "6 Clicks" Daily Activity     Outcome Measure Help from another person eating meals?: None Help from another person taking care of personal grooming?: None Help from another person toileting, which includes using toliet, bedpan, or urinal?: A Little Help from another person bathing (including washing, rinsing, drying)?: A Little Help from another person to put on and taking off regular upper body clothing?: None Help from another person to put on and taking off regular lower body clothing?: A Little 6 Click Score: 21   End of Session    Activity Tolerance:  pt fatigued quickly Patient left:  in bed                   Time: 1016-1051 OT Time Calculation (min): 35 min Charges:  OT General Charges $OT Visit: 1 Visit OT Evaluation $OT Eval Moderate Complexity: 1 Mod OT Treatments $Self Care/Home Management : 8-22 mins  Kari Baars, OT Acute Rehabilitation Services [phone] Office- [phone]     Kafi Dotter, Edwena Felty D 07/06/2019, 12:42 PM

## 2019-07-06 NOTE — Discharge Summary (Addendum)
Physician Discharge Summary  Stacey [name] [id number] DOB: 1941/02/19 DOA: 07/03/2019  PCP: Shon Baton, MD  Admit date: 07/03/2019 Discharge date: 07/06/2019 Consultations: none Admitted From: home Disposition: home  Discharge Diagnoses:  Principal Problem:   Rib fractures Active Problems:   Rheumatoid arthritis ([place])   ILD (interstitial lung disease) ([place])   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[place])   Acute on chronic respiratory failure with hypoxia ([place])   Fall at home, initial encounter   Orthostatic hypotension  Hospital Course Summary: 78 year old female with history of HTN, depression, GERD, rheumatoid arthritis, interstitial lung disease who is on chronic O2 2 L via nasal cannula presented after a fall. Of note patient tested positive for COVID-19 on 11/18 and recently admitted for Covid pneumonia on 11/29, treated with remdesivir/Decadron. Patient states her blood pressure medications were recently adjusted (changed to Cardizem few weeks back) after which she was feeling wobbly and lightheaded. On PCP follow-up she was told to discontinue blood pressure medications as her blood pressure was low normal with systolic in the 0000000. She states she felt better after but not quite to baseline. Yesterday she again felt "off balance" and sustained a fall resulting in multiple rib fractures (Nondisplaced posterior left fifth, sixth, eighth, ninth, and tenth rib fractures). CT chest/abdomen/pelvis did not reveal any other traumatic findings. 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 Patient admitted to [facility] service for pain control. She received IV fentanyl, Solu-Medrol and nebulizer treatments while in ED. Hospital course complicated by transient increase in O2 requirements due to rib fractures/spliniting/low resp effort. Also noted to be orthostatic which is  likely contributing to her falls.   1.  Orthostasis/fall: Patient reports dizziness with position changes causing her to lose balance prior to the fall.  She was recently evaluated by PCP and told to discontinue home antihypertensives including Cardizem and concern for low blood pressure noted in office visit.  Orthostatic blood pressure checks do show significant change.  Advised support stockings.  Will allow supine hypertension to maintain standing blood pressure and adequate cerebral perfusion.  Can consider adding Florinef if continues to be orthostatic with symptoms upon PCP F/U.  PT evaluation requested and they recommended home PT-referral placed  2.  Multiple rib fractures: As a consequence of fall.CT chest/abdomen/pelvis did not reveal any other traumatic findings.  Continue pain medications.  Patient states lidocaine patch and oral hydrocodone helping.  Also on Neurontin at baseline.  Not requiring much of IV medications now. Gave prescription for Norco/Lidocaine patch  3.  Acute on chronic hypoxic respiratory failure: Likely secondary to hypoventilation/splinting in the setting of rib fractures.CT chest reported patchy airspace opacity in the periphery of left lower lung likely atelectasis/contusion and stable chronic fibrotic interstitial lung disease changes.  Patient currently on 5 L O2 via nasal cannula while at home she uses 2 to 3 L.  Encouraged incentive spirometry while here and now back to baseline O2 3lits.   4.  Interstitial lung disease and recent COVID-19 infection: No wheezing on lung exam.  Continue home inhaler therapy, nebs as needed.  COVID-19 test resulted positive but likely related to recent infection (within 90 days).  Discussed with ID curbside who did not feel patient needed airborne precautions.    5.  Rheumatoid arthritis: Previously on Rituxan injections.  (?  Not getting since March 2020) it appears that patient was also on methylprednisolone 2 mg alternating  with 4  mg, unclear if this was discontinued--resumed  6.  GERD: Resume PPI  7.  Depression: Resumed home meds    Discharge Exam:  Vitals:   07/06/19 0515 07/06/19 0823  BP: (!) 148/67   Pulse: 70   Resp: 20   Temp: 98.2 F (36.8 C)   SpO2: 95% (P) 99%   Vitals:   07/05/19 1959 07/06/19 0515 07/06/19 0521 07/06/19 0823  BP: 134/62 (!) 148/67    Pulse: 77 70    Resp: 20 20    Temp: 98.2 F (36.8 C) 98.2 F (36.8 C)    TempSrc: Oral Oral    SpO2: 94% 95%  (P) 99%  Weight:   64.4 kg   Height:        General: Pt is alert, awake, not in acute distress Cardiovascular: RRR, S1/S2 +, no rubs, no gallops Respiratory: CTA bilaterally except mild basal velcro crepts, no wheezing, no rhonchi Abdominal: Soft, NT, ND, bowel sounds + Extremities: no edema, no cyanosis  Discharge Condition:Stable CODE STATUS: Full code Diet recommendation: Regular Recommendations for Outpatient Follow-up:  1. Follow up with PCP: next week 2. Follow up with consultants: Primary Pulmonary as scheduled 3. Please obtain follow up : Orthostatic BP check  Home Health services upon discharge: yes Equipment/Devices upon discharge: resume home 02   Discharge Instructions:  Discharge Instructions    Call MD for:  difficulty breathing, headache or visual disturbances   Complete by: As directed    Call MD for:  severe uncontrolled pain   Complete by: As directed    Call MD for:  temperature >100.4   Complete by: As directed    Diet - low sodium heart healthy   Complete by: As directed    Increase activity slowly   Complete by: As directed      Allergies as of 07/06/2019      Reactions   Penicillins Other (See Comments)   Reaction unknown occurred during childhood Has patient had a PCN reaction causing immediate rash, facial/tongue/throat swelling, SOB or lightheadedness with hypotension: Unknown Has patient had a PCN reaction causing severe rash involving mucus membranes or skin necrosis:  Unknown Has patient had a PCN reaction that required hospitalization: No Has patient had a PCN reaction occurring within the last 10 years: No If all of the above answers are "NO", then may proceed with Cephalosporin use. Unsur   Remicade [infliximab] Other (See Comments)   Reaction unknown   Sulfa Antibiotics Other (See Comments)   Reaction unknown   Sulfamethoxazole Other (See Comments)   Unsure of reaction As a child not sure      Medication List    STOP taking these medications   chlorpheniramine-HYDROcodone 10-8 MG/5ML Suer Commonly known as: TUSSIONEX     TAKE these medications   acetaminophen 325 MG tablet Commonly known as: TYLENOL Take 2 tablets (650 mg total) by mouth every 6 (six) hours as needed for mild pain or headache (fever >/= 101).   albuterol 108 (90 Base) MCG/ACT inhaler Commonly known as: VENTOLIN HFA Inhale 2 puffs into the lungs every 6 (six) hours as needed for wheezing or shortness of breath.   ALPRAZolam 0.25 MG tablet Commonly known as: XANAX Take 1 tablet (0.25 mg total) by mouth 3 (three) times daily as needed for anxiety.   amitriptyline 25 MG tablet Commonly known as: ELAVIL Take 25 mg by mouth at bedtime.   benzonatate 200 MG capsule Commonly known as: TESSALON Take 1 capsule (200 mg total) by  mouth 3 (three) times daily as needed for cough.   beta carotene w/minerals tablet Take 1 tablet by mouth daily.   cholecalciferol 1000 units tablet Commonly known as: VITAMIN D Take 1,000 Units by mouth daily.   dextromethorphan 30 MG/5ML liquid Commonly known as: DELSYM Take 2.5 mLs (15 mg total) by mouth at bedtime.   gabapentin 300 MG capsule Commonly known as: NEURONTIN Take 600 mg by mouth at bedtime.   HYDROcodone-acetaminophen 5-325 MG tablet Commonly known as: NORCO/VICODIN Take 1 tablet by mouth every 6 (six) hours as needed for up to 5 days for severe pain.   ipratropium-albuterol 0.5-2.5 (3) MG/3ML Soln Commonly known as:  DUONEB Take 3 mLs by nebulization 3 (three) times daily. What changed:   when to take this  reasons to take this   lidocaine 5 % Commonly known as: LIDODERM Place 1 patch onto the skin daily. Remove & Discard patch within 12 hours or as directed by MD   methylPREDNISolone 4 MG tablet Commonly known as: MEDROL Take 1 tablet (4 mg total) by mouth every other day. Alternate with the 2mg  dose   methylPREDNISolone 2 MG tablet Commonly known as: MEDROL Take 1 tablet (2 mg total) by mouth every other day. Alternate with the 4mg  dose   mometasone-formoterol 100-5 MCG/ACT Aero Commonly known as: DULERA Inhale 2 puffs into the lungs 2 (two) times daily.   pantoprazole 40 MG tablet Commonly known as: PROTONIX Take 1 tablet (40 mg total) by mouth 2 (two) times daily before a meal.   polyethylene glycol 17 g packet Commonly known as: MIRALAX / GLYCOLAX Take 17 g by mouth daily as needed for mild constipation.   pravastatin 20 MG tablet Commonly known as: PRAVACHOL Take 1 tablet (20 mg total) by mouth every evening.   Rituxan 100 MG/10ML injection Generic drug: riTUXimab Inject into the vein. Receives at [facility] but unsure of dose - no more doses for next 6 months as of 09/14/18   senna-docusate 8.6-50 MG tablet Commonly known as: Senokot-S Take 1 tablet by mouth 2 (two) times daily for 20 days.   venlafaxine XR 75 MG 24 hr capsule Commonly known as: EFFEXOR-XR Take 75 mg by mouth at bedtime.       Allergies  Allergen Reactions  . Penicillins Other (See Comments)    Reaction unknown occurred during childhood Has patient had a PCN reaction causing immediate rash, facial/tongue/throat swelling, SOB or lightheadedness with hypotension: Unknown Has patient had a PCN reaction causing severe rash involving mucus membranes or skin necrosis: Unknown Has patient had a PCN reaction that required hospitalization: No Has patient had a PCN reaction occurring within the last 10 years: No If  all of the above answers are "NO", then may proceed with Cephalosporin use.  Unsur  . Remicade [Infliximab] Other (See Comments)    Reaction unknown  . Sulfa Antibiotics Other (See Comments)    Reaction unknown  . Sulfamethoxazole Other (See Comments)    Unsure of reaction As a child not sure       The results of significant diagnostics from this hospitalization (including imaging, microbiology, ancillary and laboratory) are listed below for reference.    Labs: BNP (last 3 results) No results for input(s): BNP in the last 8760 hours. Basic Metabolic Panel: Recent Labs  Lab 07/03/19 2231 07/04/19 0427  NA 133* 134*  K 4.7 4.5  CL 95* 98  CO2 29 27  GLUCOSE 97 129*  BUN 10 11  CREATININE 0.72 0.66  CALCIUM 8.9 8.9   Liver Function Tests: Recent Labs  Lab 07/03/19 2231 07/04/19 0427  AST 19 17  ALT 17 17  ALKPHOS 78 74  BILITOT 0.5 0.4  PROT 6.6 6.0*  ALBUMIN 3.3* 3.0*   Recent Labs  Lab 07/03/19 2231  LIPASE 26   No results for input(s): AMMONIA in the last 168 hours. CBC: Recent Labs  Lab 07/03/19 2231 07/04/19 0427  WBC 7.7 7.1  NEUTROABS 4.3  --   HGB 12.1 11.9*  HCT 38.5 37.2  MCV 92.5 90.3  PLT 244 233   Cardiac Enzymes: No results for input(s): CKTOTAL, CKMB, CKMBINDEX, TROPONINI in the last 168 hours. BNP: Invalid input(s): POCBNP CBG: No results for input(s): GLUCAP in the last 168 hours. D-Dimer No results for input(s): DDIMER in the last 72 hours. Hgb A1c No results for input(s): HGBA1C in the last 72 hours. Lipid Profile No results for input(s): CHOL, HDL, LDLCALC, TRIG, CHOLHDL, LDLDIRECT in the last 72 hours. Thyroid function studies No results for input(s): TSH, T4TOTAL, T3FREE, THYROIDAB in the last 72 hours.  Invalid input(s): FREET3 Anemia work up No results for input(s): VITAMINB12, FOLATE, FERRITIN, TIBC, IRON, RETICCTPCT in the last 72 hours. Urinalysis    Component Value Date/Time   COLORURINE YELLOW 12/18/2009  0120   APPEARANCEUR CLEAR 12/18/2009 0120   LABSPEC 1.006 12/18/2009 0120   PHURINE 7.0 12/18/2009 0120   GLUCOSEU NEGATIVE 12/18/2009 0120   HGBUR NEGATIVE 12/18/2009 0120   BILIRUBINUR NEGATIVE 12/18/2009 0120   KETONESUR NEGATIVE 12/18/2009 0120   PROTEINUR NEGATIVE 12/18/2009 0120   UROBILINOGEN 0.2 12/18/2009 0120   NITRITE NEGATIVE 12/18/2009 0120   LEUKOCYTESUR TRACE (A) 12/18/2009 0120   Sepsis Labs Invalid input(s): PROCALCITONIN,  WBC,  LACTICIDVEN Microbiology Recent Results (from the past 240 hour(s))  SARS CORONAVIRUS 2 (TAT 6-24 HRS) Nasopharyngeal Nasopharyngeal Swab     Status: Abnormal   Collection Time: 07/04/19  3:30 PM   Specimen: Nasopharyngeal Swab  Result Value Ref Range Status   SARS Coronavirus 2 POSITIVE (A) NEGATIVE Final    Comment: RESULT CALLED TO, READ BACK BY AND VERIFIED WITH: P LITKA,RN 2328 07/02/2019 D BRADLEY (NOTE) SARS-CoV-2 target nucleic acids are DETECTED. The SARS-CoV-2 RNA is generally detectable in upper and lower respiratory specimens during the acute phase of infection. Positive results are indicative of the presence of SARS-CoV-2 RNA. Clinical correlation with patient history and other diagnostic information is  necessary to determine patient infection status. Positive results do not rule out bacterial infection or co-infection with other viruses.  The expected result is Negative. Fact Sheet for Patients: SugarRoll.be Fact Sheet for Healthcare Providers: https://www.woods-mathews.com/ This test is not yet approved or cleared by the [place] FDA and  has been authorized for detection and/or diagnosis of SARS-CoV-2 by FDA under an Emergency Use Authorization (EUA). This EUA will remain  in effect (meaning this test can be used) for th e duration of the COVID-19 declaration under Section 564(b)(1) of the Act, 21 U.S.C. section 360bbb-3(b)(1), unless the authorization is terminated  or revoked sooner. Performed at [facility], [place] [address] [place], [place] [address]     Procedures/Studies: CT Chest W Contrast  Result Date: 07/04/2019 CLINICAL DATA:  Left flank pain, after fall question trauma EXAM: CT CHEST, abdomen, and pelvis WITH CONTRAST TECHNIQUE: Multidetector CT imaging of the chest was performed during intravenous contrast administration. CONTRAST:  178mL OMNIPAQUE IOHEXOL 300 MG/ML  SOLN COMPARISON:  CT chest April 24, 2019 FINDINGS: Cardiovascular:  Normal heart size. No significant pericardial fluid/thickening. Coronary artery calcifications are seen. Scattered aortic atherosclerosis is noted. 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 No evidence of acute thoracic aortic injury. No central pulmonary emboli. Mediastinum/Nodes: No pneumomediastinum. No mediastinal hematoma. Unremarkable esophagus. No axillary, mediastinal or hilar lymphadenopathy. Lungs/Pleura:There is a new small area of patchy/ground-glass opacity seen in the periphery of the left lower lung, series 3, image 37. There is bilateral areas subpleural reticulonodular opacities and small amount of honeycombing seen peripherally. Biapical scarring is noted with pleural calcification. This appears to be unchanged since the prior exam. No pneumothorax. No pleural effusion. Musculoskeletal: There is nondisplaced fractures of the posterior left fifth, 6, 8, 9, and tenth left ribs. There is unchanged slight superior compression deformities of the T5, T6, T10 vertebral bodies with less than 25% loss in height. Abdomen/pelvis: Hepatobiliary: Homogeneous hepatic attenuation without traumatic injury. No focal lesion. The patient is status post cholecystectomy. No biliary ductal dilation. Pancreas: No evidence for traumatic injury. Portions are partially obscured by adjacent bowel loops and paucity of intra-abdominal fat. No ductal dilatation or inflammation.  Spleen: Homogeneous attenuation without traumatic injury. Normal in size. Adrenals/Urinary Tract: No adrenal hemorrhage. Kidneys demonstrate symmetric enhancement and excretion on delayed phase imaging. No evidence or renal injury. Ureters are well opacified proximal through mid portion. Bladder is physiologically distended without wall thickening. Stomach/Bowel: Suboptimally assessed without enteric contrast, allowing for this, no evidence of bowel injury. Stomach physiologically distended. There are no dilated or thickened small or large bowel loops. Moderate stool burden. No evidence of mesenteric hematoma. No free air free fluid. Vascular/Lymphatic: No acute vascular injury. The abdominal aorta and IVC are intact. No evidence of retroperitoneal, abdominal, or pelvic adenopathy. Scattered aortic atherosclerotic calcifications are seen without aneurysmal dilatation. Reproductive: No acute abnormality. Other: No focal contusion or abnormality of the abdominal wall. Musculoskeletal: No acute fracture of the lumbar spine or bony pelvis. IMPRESSION: 1. Nondisplaced posterior left fifth, sixth, eighth, ninth, and tenth rib fractures. 2. New ground-glass/patchy airspace opacity in the periphery of the left lower lung which could be due to atelectasis and/or contusion. 3. Stable findings suggestive of chronic fibrotic interstitial lung disease as seen on prior dating back to April 24, 2019. 4.  Aortic Atherosclerosis (ICD10-I70.0). 5. Chronic slight superior compression deformities of T5, T6, and T10 with less than 25% loss in vertebral body height. Electronically Signed   By: Prudencio Pair M.D.   On: 07/04/2019 00:31   CT ABDOMEN PELVIS W CONTRAST  Result Date: 07/04/2019 CLINICAL DATA:  Left flank pain, after fall question trauma EXAM: CT CHEST, abdomen, and pelvis WITH CONTRAST TECHNIQUE: Multidetector CT imaging of the chest was performed during intravenous contrast administration. CONTRAST:  174mL OMNIPAQUE  IOHEXOL 300 MG/ML  SOLN COMPARISON:  CT chest April 24, 2019 FINDINGS: Cardiovascular: Normal heart size. No significant pericardial fluid/thickening. Coronary artery calcifications are seen. Scattered aortic atherosclerosis is noted. 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 No evidence of acute thoracic aortic injury. No central pulmonary emboli. Mediastinum/Nodes: No pneumomediastinum. No mediastinal hematoma. Unremarkable esophagus. No axillary, mediastinal or hilar lymphadenopathy. Lungs/Pleura:There is a new small area of patchy/ground-glass opacity seen in the periphery of the left lower lung, series 3, image 37. There is bilateral areas subpleural reticulonodular opacities and small amount of honeycombing seen peripherally. Biapical scarring is noted with pleural calcification. This appears to be unchanged since the prior exam.  No pneumothorax. No pleural effusion. Musculoskeletal: There is nondisplaced fractures of the posterior left fifth, 6, 8, 9, and tenth left ribs. There is unchanged slight superior compression deformities of the T5, T6, T10 vertebral bodies with less than 25% loss in height. Abdomen/pelvis: Hepatobiliary: Homogeneous hepatic attenuation without traumatic injury. No focal lesion. The patient is status post cholecystectomy. No biliary ductal dilation. Pancreas: No evidence for traumatic injury. Portions are partially obscured by adjacent bowel loops and paucity of intra-abdominal fat. No ductal dilatation or inflammation. Spleen: Homogeneous attenuation without traumatic injury. Normal in size. Adrenals/Urinary Tract: No adrenal hemorrhage. Kidneys demonstrate symmetric enhancement and excretion on delayed phase imaging. No evidence or renal injury. Ureters are well opacified proximal through mid portion. Bladder is physiologically distended without wall thickening. Stomach/Bowel: Suboptimally assessed without enteric contrast,  allowing for this, no evidence of bowel injury. Stomach physiologically distended. There are no dilated or thickened small or large bowel loops. Moderate stool burden. No evidence of mesenteric hematoma. No free air free fluid. Vascular/Lymphatic: No acute vascular injury. The abdominal aorta and IVC are intact. No evidence of retroperitoneal, abdominal, or pelvic adenopathy. Scattered aortic atherosclerotic calcifications are seen without aneurysmal dilatation. Reproductive: No acute abnormality. Other: No focal contusion or abnormality of the abdominal wall. Musculoskeletal: No acute fracture of the lumbar spine or bony pelvis. IMPRESSION: 1. Nondisplaced posterior left fifth, sixth, eighth, ninth, and tenth rib fractures. 2. New ground-glass/patchy airspace opacity in the periphery of the left lower lung which could be due to atelectasis and/or contusion. 3. Stable findings suggestive of chronic fibrotic interstitial lung disease as seen on prior dating back to April 24, 2019. 4.  Aortic Atherosclerosis (ICD10-I70.0). 5. Chronic slight superior compression deformities of T5, T6, and T10 with less than 25% loss in vertebral body height. Electronically Signed   By: Prudencio Pair M.D.   On: 07/04/2019 00:31   DG Chest Portable 1 View  Result Date: 06/11/2019 CLINICAL DATA:  78 year old female with cough and shortness of breath. Positive for COVID-19 1 week ago. EXAM: PORTABLE CHEST 1 VIEW COMPARISON:  Chest radiographs 05/06/2019 and earlier. FINDINGS: Portable AP upright view at 2127 hours. Chronic interstitial lung disease. Stable lung volumes. Stable cardiac size and mediastinal contours. Calcified aortic atherosclerosis. Visualized tracheal air column is within normal limits. Compared to a portable chest on 12/17/2018, there is increased patchy opacity at the left lung base, and minimal nodular opacity appears new at the right lung base. Elsewhere lung markings appear stable. No pleural effusion is  evident. Stable cholecystectomy clips. Negative visible bowel gas pattern. No acute osseous abnormality identified. IMPRESSION: Chronic pulmonary interstitial disease. Evidence of new patchy opacity at the lung bases left greater than right, suspicious for COVID-19 pneumonia in this clinical setting. No pleural effusion. Electronically Signed   By: Genevie Ann M.D.   On: 06/11/2019 21:50    Time coordinating discharge: Over 30 minutes  SIGNED:   Guilford Shi, MD  [facility] 07/06/2019, 10:12 AM Pager : [phone]

## 2019-07-06 NOTE — Care Management Important Message (Signed)
Important Message  Patient Details IM Letter given to Cookie McGibboney RN to present to the Patient Name: SUAD JANSSON MRN: [id number] Date of Birth: 07-Aug-1940   Medicare Important Message Given:  Yes     Kerin Salen 07/06/2019, 10:45 AM

## 2019-07-10 ENCOUNTER — Telehealth: Payer: Self-pay | Admitting: Internal Medicine

## 2019-07-10 NOTE — Telephone Encounter (Signed)
Routing to myself to follow up on ?

## 2019-07-11 ENCOUNTER — Encounter: Payer: Self-pay | Admitting: *Deleted

## 2019-07-11 ENCOUNTER — Other Ambulatory Visit: Payer: Self-pay | Admitting: *Deleted

## 2019-07-11 NOTE — Telephone Encounter (Signed)
I checked MR's box and I have not seen an envelope with letter in there from pt's husband that was dropped off yesterday 12/28. Rachael, please advise if you know if anyone from pharmacy team picked the letter up as I believe this might be regarding pt's medication and her current medical conditions?

## 2019-07-11 NOTE — Telephone Encounter (Signed)
Spoke with patient. She is aware to not start the Ofev until after her appt. She has been scheduled as a televisit on 1/21 at 1130. Nothing further needed at time of call.

## 2019-07-11 NOTE — Telephone Encounter (Signed)
She frdctured her ribs and admitted 12/21-12/24/2020.   Plan  - do not strt ofev  - app or MR visit in 2-3 weeks t make sure is safe to start ofev

## 2019-07-11 NOTE — Patient Outreach (Signed)
Telephone outreach for [facility] on EMMI DISCHARGE CALL: No follow up appt scheduled.  Unable to talk to pt but was able to leave a voice mail and requested a return call.  Eulah Pont. Myrtie Neither, MSN, [facility] Gerontological Nurse Practitioner [facility] Care Management [phone]

## 2019-07-12 NOTE — Telephone Encounter (Signed)
Pharmacy team did not receive or pick up an envelope in reference to this patient.

## 2019-07-12 NOTE — Telephone Encounter (Signed)
Called and spoke with pt's husband Festus Aloe letting him know to hold off on calling to get shipment of [place] at this time as MR wants pt to have a f/u visit first either with him or APP to check to see if pt would be strong enough to be able to begin OFEV and he verbalized understanding. Pt has been scheduled for an appt 07/19/18 at 11am with Beth. Nothing further needed.

## 2019-07-12 NOTE — Telephone Encounter (Signed)
Called and spoke with pt's husband Festus Aloe letting him know that I never received the letter that he brought to the office and am unsure what happened with the letter due to our front staff not sure where it is either after they thought they had placed it in MR's box. Ned stated that the letter was just from him discussing pt's current medical conditions. Festus Aloe stated pt had fallen and after being in a lot of pain that was not going away, she ended up going to the ER to be evaluated. Pt was admitted and was at hospital from 12/21-12/24. Pt ended up having multiple rib fractures that are seen on CT and also had a bruised lung.  Festus Aloe stated that pt has not started on the OFEV yet and said that it is ready at the specialty pharmacy to have the shipment scheduled but he wanted to know from MR if he was wanting pt to go ahead and start on it or if he wanted pt to hold off on beginning it until after upcoming televisit on 1/21 with him.  MR, please advise if you do want pt to go ahead and begin the OFEV medication or if you want her to hold off on getting a shipment of it until after her upcoming televisit with you 1/21?

## 2019-07-12 NOTE — Telephone Encounter (Signed)
Odd - I thought I did a note on this yesterday. She is too weak right now. She will need a face to face OV with me or an app between jan 7-15th - 30 min slot to make sure she is strong enough to start ofev. Till then no ofev

## 2019-07-17 ENCOUNTER — Other Ambulatory Visit: Payer: Self-pay | Admitting: *Deleted

## 2019-07-17 ENCOUNTER — Telehealth: Payer: Self-pay | Admitting: Internal Medicine

## 2019-07-17 NOTE — Patient Outreach (Signed)
[facility]) Care Management  07/17/2019  Stacey [name] October 05, 1940 [id number]     EMMI-General Discharge RED ON EMMI ALERT Day # 1 Date:07/08/2019 Red Alert Reason: Scheduled follow up appointment  Outreach # 2 RN spoke with pt and confirmed the above emmi has been resolved. Pt states she had an appointment and currently pending several others.   Plan: No other needs to address at this time. Case will be closed.  Raina Mina, RN Care Management Coordinator [place] Office [phone]

## 2019-07-17 NOTE — Telephone Encounter (Signed)
Called and spoke with Stacey [name], [facility]. [name] spoke with Patient's Spouse about starting Ofev.  Patient's spouse stated Patient had been positive for Covid, and fell and broke ribs. Stacey [name] stated patient's spouse said Patient was still weak, and they were concerned about side effects from Ofev. Stacey [name] stated she was told they would like to hold off on Ofev, until after upcoming OV, and discussing with provider.

## 2019-07-17 NOTE — Telephone Encounter (Signed)
lmtcb for [name] at [place] for more info regarding this issue.

## 2019-07-17 NOTE — Telephone Encounter (Signed)
[name] [phone] ext [phone]

## 2019-07-18 DIAGNOSIS — I951 Orthostatic hypotension: Secondary | ICD-10-CM | POA: Diagnosis not present

## 2019-07-18 DIAGNOSIS — J45909 Unspecified asthma, uncomplicated: Secondary | ICD-10-CM | POA: Diagnosis not present

## 2019-07-18 DIAGNOSIS — F329 Major depressive disorder, single episode, unspecified: Secondary | ICD-10-CM | POA: Diagnosis not present

## 2019-07-18 DIAGNOSIS — M8580 Other specified disorders of bone density and structure, unspecified site: Secondary | ICD-10-CM | POA: Diagnosis not present

## 2019-07-18 DIAGNOSIS — K219 Gastro-esophageal reflux disease without esophagitis: Secondary | ICD-10-CM | POA: Diagnosis not present

## 2019-07-18 DIAGNOSIS — U071 COVID-19: Secondary | ICD-10-CM | POA: Diagnosis not present

## 2019-07-18 DIAGNOSIS — F419 Anxiety disorder, unspecified: Secondary | ICD-10-CM | POA: Diagnosis not present

## 2019-07-18 DIAGNOSIS — Z8701 Personal history of pneumonia (recurrent): Secondary | ICD-10-CM | POA: Diagnosis not present

## 2019-07-18 DIAGNOSIS — I2510 Atherosclerotic heart disease of native coronary artery without angina pectoris: Secondary | ICD-10-CM | POA: Diagnosis not present

## 2019-07-18 DIAGNOSIS — M069 Rheumatoid arthritis, unspecified: Secondary | ICD-10-CM | POA: Diagnosis not present

## 2019-07-18 DIAGNOSIS — S2242XD Multiple fractures of ribs, left side, subsequent encounter for fracture with routine healing: Secondary | ICD-10-CM | POA: Diagnosis not present

## 2019-07-18 DIAGNOSIS — J9621 Acute and chronic respiratory failure with hypoxia: Secondary | ICD-10-CM | POA: Diagnosis not present

## 2019-07-20 ENCOUNTER — Ambulatory Visit (INDEPENDENT_AMBULATORY_CARE_PROVIDER_SITE_OTHER): Payer: Medicare Other | Admitting: Primary Care

## 2019-07-20 ENCOUNTER — Encounter: Payer: Self-pay | Admitting: Primary Care

## 2019-07-20 ENCOUNTER — Other Ambulatory Visit: Payer: Self-pay

## 2019-07-20 ENCOUNTER — Ambulatory Visit (INDEPENDENT_AMBULATORY_CARE_PROVIDER_SITE_OTHER): Payer: Medicare Other

## 2019-07-20 VITALS — BP 124/60 | HR 86 | Ht 66.0 in | Wt 137.6 lb

## 2019-07-20 DIAGNOSIS — J9611 Chronic respiratory failure with hypoxia: Secondary | ICD-10-CM | POA: Diagnosis not present

## 2019-07-20 DIAGNOSIS — J8489 Other specified interstitial pulmonary diseases: Secondary | ICD-10-CM

## 2019-07-20 DIAGNOSIS — M359 Systemic involvement of connective tissue, unspecified: Secondary | ICD-10-CM | POA: Diagnosis not present

## 2019-07-20 DIAGNOSIS — J9621 Acute and chronic respiratory failure with hypoxia: Secondary | ICD-10-CM | POA: Diagnosis not present

## 2019-07-20 DIAGNOSIS — J1282 Pneumonia due to coronavirus disease 2019: Secondary | ICD-10-CM

## 2019-07-20 DIAGNOSIS — U071 COVID-19: Secondary | ICD-10-CM

## 2019-07-20 DIAGNOSIS — J849 Interstitial pulmonary disease, unspecified: Secondary | ICD-10-CM

## 2019-07-20 LAB — HEPATIC FUNCTION PANEL
ALT: 13 U/L (ref 0–35)
AST: 20 U/L (ref 0–37)
Albumin: 3.6 g/dL (ref 3.5–5.2)
Alkaline Phosphatase: 88 U/L (ref 39–117)
Bilirubin, Direct: 0 mg/dL (ref 0.0–0.3)
Total Bilirubin: 0.3 mg/dL (ref 0.2–1.2)
Total Protein: 6.3 g/dL (ref 6.0–8.3)

## 2019-07-20 MED ORDER — OFEV 100 MG PO CAPS: ORAL_CAPSULE | ORAL | 6 refills | Status: DC

## 2019-07-20 MED ORDER — GUAIFENESIN ER 600 MG PO TB12: 600.0000 mg | ORAL_TABLET | Freq: Two times a day (BID) | ORAL | 1 refills | Status: DC

## 2019-07-20 NOTE — Progress Notes (Signed)
@Patient  ID: Stacey [name], female    DOB: 02-27-1941, 79 y.o.   MRN: [id number]  Chief Complaint  Patient presents with  . Follow-up    c/o cough 4-5 days,occass.prod.green,no fcs,sob,wheezing. Here to discuss [place]    Referring provider: Shon Baton, MD  HPI: 79 year old female, never smoked. PMH significant for ILD, restrictive lung disease, chronic respiratory failure with hypoxia, GERD, HTN, RA. Patient of Dr. Chase Caller. Followed by rheumatology at [facility]. Maintained on Rituxan and Dulera 200. Due for spirometry with DLCO in July 2020.   Previous [facility] pulmonary encounter: 03/08/2019 Reports increased chronic cough and wheezing over the last week. Cough is congested with very little production. She has not tried mucinex. She is not currently using her flutter valve. She has been using Dulera 100 as prescribed twice daily. Using oxygen which seems to help. She has been taking her DUONEB nebulizer twice a day for the last week. She is on chronic 4mg  medrol. Denies fever. She will be getting testing for COVID prior to PFTs on September 14th.   07/20/2019 Patient presents today for regular office visit to discuss starting OFEV. Accompanied by her husband. She has questions regarding side effects of antifibrotic medication. She developed a cough 5 days ago after being hospitalized for fall where she sustained five rib fractures. She is getting up occasional mucus which is mostly clear with some green tinge. Shortness of breath is baseline. She has noticed some increased wheezing. She tested positive for COVID-19 on 11/18 and recently was admitted for covid pneumonia on 11/29 where she was treated with remdesivir/Decadron. CT chest showed new patchy airspace opacity in the LLL likely atelectasis/contusion and stable chronic fibrotic interstitial lung disease. No recent fevers.     Ambulatory Walk 07/20/2019 2 Lap- O2 92% RA; HR 109 No shortness of breath She did not need to stop Used walker    Allergies  Allergen Reactions  . Penicillins Other (See Comments)    Reaction unknown occurred during childhood Has patient had a PCN reaction causing immediate rash, facial/tongue/throat swelling, SOB or lightheadedness with hypotension: Unknown Has patient had a PCN reaction causing severe rash involving mucus membranes or skin necrosis: Unknown Has patient had a PCN reaction that required hospitalization: No Has patient had a PCN reaction occurring within the last 10 years: No If all of the above answers are "NO", then may proceed with Cephalosporin use.  Unsur  . Remicade [Infliximab] Other (See Comments)    Reaction unknown  . Sulfa Antibiotics Other (See Comments)    Reaction unknown  . Sulfamethoxazole Other (See Comments)    Unsure of reaction As a child not sure     Immunization History  Administered Date(s) Administered  . Influenza Split 04/12/2013, 04/12/2014, 03/14/2015, 04/23/2016  . Influenza, High Dose Seasonal PF 04/12/2017, 03/24/2018, 04/13/2019  . Influenza-Unspecified 04/03/2013, 04/22/2017  . Pneumococcal Conjugate-13 04/03/2013, 09/28/2014  . Pneumococcal Polysaccharide-23 07/13/2012, 07/24/2013  . Td 01/11/2018  . Tdap 12/10/2017    Past Medical History:  Diagnosis Date  . Abnormal finding of blood chemistry   . Asthma   . H/O measles   . H/O varicella   . Hypertension   . 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[place])   . 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 Lichen sclerosus [id number]   Asymptomatic  . Low iron   . Mitral valve prolapse   . Osteoarthritis   . Osteoporosis   . Pneumonia   . Post herpetic neuralgia   . Rheumatoid arthritis(714.0)   .  Yeast infection     Tobacco History: Social History   Tobacco Use  Smoking Status Never Smoker  Smokeless Tobacco Never Used   Counseling given: Not Answered   Outpatient Medications Prior to Visit  Medication Sig Dispense Refill  . acetaminophen (TYLENOL) 325 MG tablet Take 2 tablets (650 mg total)  by mouth every 6 (six) hours as needed for mild pain or headache (fever >/= 101).    Marland Kitchen amitriptyline (ELAVIL) 25 MG tablet Take 25 mg by mouth at bedtime.  0  . benzonatate (TESSALON) 200 MG capsule Take 1 capsule (200 mg total) by mouth 3 (three) times daily as needed for cough. 20 capsule 2  . beta carotene w/minerals (OCUVITE) tablet Take 1 tablet by mouth daily.    . cholecalciferol (VITAMIN D) 1000 UNITS tablet Take 1,000 Units by mouth daily.    Marland Kitchen gabapentin (NEURONTIN) 300 MG capsule Take 600 mg by mouth at bedtime.    Marland Kitchen ipratropium-albuterol (DUONEB) 0.5-2.5 (3) MG/3ML SOLN Take 3 mLs by nebulization 3 (three) times daily. (Patient taking differently: Take 3 mLs by nebulization every 6 (six) hours as needed (sob). ) 360 mL 0  . lidocaine (LIDODERM) 5 % Place 1 patch onto the skin daily. Remove & Discard patch within 12 hours or as directed by MD 30 patch 0  . methylPREDNISolone (MEDROL) 4 MG tablet Take 1 tablet (4 mg total) by mouth every other day. Alternate with the 2mg  dose    . mometasone-formoterol (DULERA) 100-5 MCG/ACT AERO Inhale 2 puffs into the lungs 2 (two) times daily. 13 g 0  . pantoprazole (PROTONIX) 40 MG tablet Take 1 tablet (40 mg total) by mouth 2 (two) times daily before a meal. 180 tablet 3  . polyethylene glycol (MIRALAX / GLYCOLAX) 17 g packet Take 17 g by mouth daily as needed for mild constipation.    . pravastatin (PRAVACHOL) 20 MG tablet Take 1 tablet (20 mg total) by mouth every evening. 90 tablet 3  . venlafaxine XR (EFFEXOR-XR) 75 MG 24 hr capsule Take 75 mg by mouth at bedtime.     Marland Kitchen albuterol (VENTOLIN HFA) 108 (90 Base) MCG/ACT inhaler Inhale 2 puffs into the lungs every 6 (six) hours as needed for wheezing or shortness of breath. (Patient not taking: Reported on 07/20/2019) 18 g 0  . ALPRAZolam (XANAX) 0.25 MG tablet Take 1 tablet (0.25 mg total) by mouth 3 (three) times daily as needed for anxiety. (Patient not taking: Reported on 07/20/2019) 20 tablet 0  .  dextromethorphan (DELSYM) 30 MG/5ML liquid Take 2.5 mLs (15 mg total) by mouth at bedtime. (Patient not taking: Reported on 07/20/2019) 89 mL 0  . riTUXimab (RITUXAN) 100 MG/10ML injection Inject into the vein. Receives at [facility] but unsure of dose - no more doses for next 6 months as of 09/14/18    . senna-docusate (SENOKOT-S) 8.6-50 MG tablet Take 1 tablet by mouth 2 (two) times daily for 20 days. (Patient not taking: Reported on 07/20/2019) 40 tablet 0  . methylPREDNISolone (MEDROL) 2 MG tablet Take 1 tablet (2 mg total) by mouth every other day. Alternate with the 4mg  dose (Patient not taking: Reported on 06/21/2019)     No facility-administered medications prior to visit.   Review of Systems  Review of Systems  Constitutional: Negative for chills, fever and unexpected weight change.  Respiratory: Positive for cough and wheezing.        Baseline shortness of breath   Cardiovascular: Negative.    Physical Exam  BP 124/60 (BP Location: Left Arm, Cuff Size: Normal)   Pulse 86   Ht 5\' 6"  (1.676 m)   Wt 137 lb 9.6 oz (62.4 kg)   SpO2 100%   BMI 22.21 kg/m  Physical Exam Constitutional:      Appearance: Normal appearance.     Comments: Thin adult female, chronically ill   Cardiovascular:     Rate and Rhythm: Normal rate and regular rhythm.  Pulmonary:     Effort: Pulmonary effort is normal.     Breath sounds: Wheezing present.     Comments: velco crackles bilateral mid-base Musculoskeletal:     Comments: Ambulated with walker  Skin:    General: Skin is warm and dry.  Neurological:     General: No focal deficit present.     Mental Status: She is alert and oriented to person, place, and time. Mental status is at baseline.  Psychiatric:        Mood and Affect: Mood normal.        Behavior: Behavior normal.        Thought Content: Thought content normal.        Judgment: Judgment normal.      Lab Results:  CBC    Component Value Date/Time   WBC 7.1 07/04/2019 0427   RBC 4.12  07/04/2019 0427   HGB 11.9 (L) 07/04/2019 0427   HGB LA11 03/26/2018 0507   HCT 37.2 07/04/2019 0427   PLT 233 07/04/2019 0427   MCV 90.3 07/04/2019 0427   MCH 28.9 07/04/2019 0427   MCHC 32.0 07/04/2019 0427   RDW 14.9 07/04/2019 0427   LYMPHSABS 2.1 07/03/2019 2231   MONOABS 0.9 07/03/2019 2231   EOSABS 0.3 07/03/2019 2231   BASOSABS 0.0 07/03/2019 2231    BMET    Component Value Date/Time   NA 134 (L) 07/04/2019 0427   K 4.5 07/04/2019 0427   CL 98 07/04/2019 0427   CO2 27 07/04/2019 0427   GLUCOSE 129 (H) 07/04/2019 0427   BUN 11 07/04/2019 0427   CREATININE 0.66 07/04/2019 0427   CALCIUM 8.9 07/04/2019 0427   GFRNONAA >60 07/04/2019 0427   GFRAA >60 07/04/2019 0427    BNP    Component Value Date/Time   BNP 89.4 03/23/2018 0732    ProBNP    Component Value Date/Time   PROBNP 38.0 03/21/2018 1119    Imaging: DG Chest 2 View  Result Date: 07/20/2019 CLINICAL DATA:  Cough and recent fall. EXAM: CHEST - 2 VIEW COMPARISON:  June 11, 2019 FINDINGS: Mild-to-moderate severity diffusely increased interstitial lung markings are seen. This is increased in severity when compared to the prior study. Mild biapical pleural thickening is noted. There is no evidence of a pleural effusion or pneumothorax. The heart size and mediastinal contours are within normal limits. There is marked severity calcification of the aortic arch. The visualized skeletal structures are unremarkable. Radiopaque surgical clips are seen overlying the right upper quadrant. IMPRESSION: 1. Mild-to-moderate severity diffuse interstitial infiltrates. While this may be, in part, infectious in etiology, the presence of superimposed interstitial edema cannot be excluded. Electronically Signed   By: Virgina Norfolk M.D.   On: 07/20/2019 16:18   CT Chest W Contrast  Result Date: 07/04/2019 CLINICAL DATA:  Left flank pain, after fall question trauma EXAM: CT CHEST, abdomen, and pelvis WITH CONTRAST TECHNIQUE:  Multidetector CT imaging of the chest was performed during intravenous contrast administration. CONTRAST:  171mL OMNIPAQUE IOHEXOL 300 MG/ML  SOLN COMPARISON:  CT  chest April 24, 2019 FINDINGS: Cardiovascular: Normal heart size. No significant pericardial fluid/thickening. Coronary artery calcifications are seen. Scattered aortic atherosclerosis is noted. 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 No evidence of acute thoracic aortic injury. No central pulmonary emboli. Mediastinum/Nodes: No pneumomediastinum. No mediastinal hematoma. Unremarkable esophagus. No axillary, mediastinal or hilar lymphadenopathy. Lungs/Pleura:There is a new small area of patchy/ground-glass opacity seen in the periphery of the left lower lung, series 3, image 37. There is bilateral areas subpleural reticulonodular opacities and small amount of honeycombing seen peripherally. Biapical scarring is noted with pleural calcification. This appears to be unchanged since the prior exam. No pneumothorax. No pleural effusion. Musculoskeletal: There is nondisplaced fractures of the posterior left fifth, 6, 8, 9, and tenth left ribs. There is unchanged slight superior compression deformities of the T5, T6, T10 vertebral bodies with less than 25% loss in height. Abdomen/pelvis: Hepatobiliary: Homogeneous hepatic attenuation without traumatic injury. No focal lesion. The patient is status post cholecystectomy. No biliary ductal dilation. Pancreas: No evidence for traumatic injury. Portions are partially obscured by adjacent bowel loops and paucity of intra-abdominal fat. No ductal dilatation or inflammation. Spleen: Homogeneous attenuation without traumatic injury. Normal in size. Adrenals/Urinary Tract: No adrenal hemorrhage. Kidneys demonstrate symmetric enhancement and excretion on delayed phase imaging. No evidence or renal injury. Ureters are well opacified proximal through mid portion.  Bladder is physiologically distended without wall thickening. Stomach/Bowel: Suboptimally assessed without enteric contrast, allowing for this, no evidence of bowel injury. Stomach physiologically distended. There are no dilated or thickened small or large bowel loops. Moderate stool burden. No evidence of mesenteric hematoma. No free air free fluid. Vascular/Lymphatic: No acute vascular injury. The abdominal aorta and IVC are intact. No evidence of retroperitoneal, abdominal, or pelvic adenopathy. Scattered aortic atherosclerotic calcifications are seen without aneurysmal dilatation. Reproductive: No acute abnormality. Other: No focal contusion or abnormality of the abdominal wall. Musculoskeletal: No acute fracture of the lumbar spine or bony pelvis. IMPRESSION: 1. Nondisplaced posterior left fifth, sixth, eighth, ninth, and tenth rib fractures. 2. New ground-glass/patchy airspace opacity in the periphery of the left lower lung which could be due to atelectasis and/or contusion. 3. Stable findings suggestive of chronic fibrotic interstitial lung disease as seen on prior dating back to April 24, 2019. 4.  Aortic Atherosclerosis (ICD10-I70.0). 5. Chronic slight superior compression deformities of T5, T6, and T10 with less than 25% loss in vertebral body height. Electronically Signed   By: Prudencio Pair M.D.   On: 07/04/2019 00:31   CT ABDOMEN PELVIS W CONTRAST  Result Date: 07/04/2019 CLINICAL DATA:  Left flank pain, after fall question trauma EXAM: CT CHEST, abdomen, and pelvis WITH CONTRAST TECHNIQUE: Multidetector CT imaging of the chest was performed during intravenous contrast administration. CONTRAST:  171mL OMNIPAQUE IOHEXOL 300 MG/ML  SOLN COMPARISON:  CT chest April 24, 2019 FINDINGS: Cardiovascular: Normal heart size. No significant pericardial fluid/thickening. Coronary artery calcifications are seen. Scattered aortic atherosclerosis is noted. Again noted is a aberrant origin of the right  subclavian artery with a retroesophageal course. No evidence of acute thoracic aortic injury. No central pulmonary emboli. Mediastinum/Nodes: No pneumomediastinum. No mediastinal hematoma. Unremarkable esophagus. No axillary, mediastinal or hilar lymphadenopathy. Lungs/Pleura:There is a new small area of patchy/ground-glass opacity seen in the periphery of the left lower lung, series 3, image 37. There is bilateral areas subpleural reticulonodular opacities and small amount of honeycombing seen peripherally. Biapical scarring is noted with pleural calcification. This appears to  be unchanged since the prior exam. No pneumothorax. No pleural effusion. Musculoskeletal: There is nondisplaced fractures of the posterior left fifth, 6, 8, 9, and tenth left ribs. There is unchanged slight superior compression deformities of the T5, T6, T10 vertebral bodies with less than 25% loss in height. Abdomen/pelvis: Hepatobiliary: Homogeneous hepatic attenuation without traumatic injury. No focal lesion. The patient is status post cholecystectomy. No biliary ductal dilation. Pancreas: No evidence for traumatic injury. Portions are partially obscured by adjacent bowel loops and paucity of intra-abdominal fat. No ductal dilatation or inflammation. Spleen: Homogeneous attenuation without traumatic injury. Normal in size. Adrenals/Urinary Tract: No adrenal hemorrhage. Kidneys demonstrate symmetric enhancement and excretion on delayed phase imaging. No evidence or renal injury. Ureters are well opacified proximal through mid portion. Bladder is physiologically distended without wall thickening. Stomach/Bowel: Suboptimally assessed without enteric contrast, allowing for this, no evidence of bowel injury. Stomach physiologically distended. There are no dilated or thickened small or large bowel loops. Moderate stool burden. No evidence of mesenteric hematoma. No free air free fluid. Vascular/Lymphatic: No acute vascular injury. The abdominal  aorta and IVC are intact. No evidence of retroperitoneal, abdominal, or pelvic adenopathy. Scattered aortic atherosclerotic calcifications are seen without aneurysmal dilatation. Reproductive: No acute abnormality. Other: No focal contusion or abnormality of the abdominal wall. Musculoskeletal: No acute fracture of the lumbar spine or bony pelvis. IMPRESSION: 1. Nondisplaced posterior left fifth, sixth, eighth, ninth, and tenth rib fractures. 2. New ground-glass/patchy airspace opacity in the periphery of the left lower lung which could be due to atelectasis and/or contusion. 3. Stable findings suggestive of chronic fibrotic interstitial lung disease as seen on prior dating back to April 24, 2019. 4.  Aortic Atherosclerosis (ICD10-I70.0). 5. Chronic slight superior compression deformities of T5, T6, and T10 with less than 25% loss in vertebral body height. Electronically Signed   By: Prudencio Pair M.D.   On: 07/04/2019 00:31     Assessment & Plan:   ILD (interstitial lung disease) ([place]) 05/02/19 HRCT- showed fibrotic interstitial lung disease with mild honeycombing which has progressed since 2017 and 2019 high-resolution chest CT studies. 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  - Plan start anti-fibrotic OFEV 100mg  daily x 1 week; then increase to twice daily - Discussed potential side effects with medication and gave patient education material  - LFTS normal 07/20/2019  - Ambulatory O2 92% RA after 2 laps; no desaturations  - Follow-up every 4 weeks for first 3 months while on anti-fibrotic medication  Chronic respiratory failure with hypoxia ([facility]) - Stable - Ambulatory O2 92% RA after 2 laps; no oxygen desaturation  Pneumonia due to COVID-19 virus - Tested positive for COVID-19 on 11/18; admitted for covid pneumonia on 11/29  treated with remdesivir/Decadron - Clinically appears well, she continues to have productive cough with some  mucus production and wheezing. VSS, O2 stable. Eating and drinking. Strength looks ok, ambulating with walker.  - CXR 07/20/2019 showed mild-to-moderate severity diffuse interstitial infiltrates most consistent with previous covid-19 pneumonia and underlying ILD - Recommend patient continue Mucinex 600mg  twice daily - If continues to have productive cough consider course oral azithromycin    Martyn Ehrich, NP 07/21/2019

## 2019-07-20 NOTE — Patient Instructions (Addendum)
Recommendation: Take Mucinex twice daily for chest congestion Delsym cough syrup twice daily  Use Dulera inhaler 1-2 puffs twice daily consistently   Orders: CXR today r/o pneumonia  Labs (LFTs)  RX: OFEV 100mg  daily x 1 week; then increase to 100mg  twice daily *Please call [place] [phone]) and give verbal order for above dosing  Most common side effects of OFEV are diarrhea, nausea, decreased appetite  You can take imodium as needed for loose stool Eat small meals and stay well hydrated  Follow-up: 4 weeks with MR or Beth     Nintedanib (OFEV) oral capsules What is this medicine? NINTEDANIB (nin TED a nib) is a drug used to treat lung diseases, including idiopathic pulmonary fibrosis, chronic progressive fibrosing interstitial lung diseases, and systemic sclerosis-associated interstitial lung disease. It works by preventing and slowing lung tissue scarring. This medicine may be used for other purposes; ask your health care provider or pharmacist if you have questions. COMMON BRAND NAME(S): Ofev What should I tell my health care provider before I take this medicine? They need to know if you have any of these conditions:  bleeding disorders or family history of bleeding disorders  cigarette smoker  heart disease  history of blood clots  liver disease  recent surgery  stomach or intestinal problems  an unusual or allergic reaction to nintedanib, other medicines, foods, dyes, or preservatives  pregnant or trying to get pregnant  breast-feeding How should I use this medicine? Take this medicine by mouth with a glass of water. Follow the directions on the prescription label. Take this medicine with food. Do not cut, crush or chew this medicine. Swallow the capsules whole. Take your medicine at regular intervals. Do not take it more often than directed. Do not stop taking except on your doctor's advice. Talk to your pediatrician regarding the use of this  medicine in children. Special care may be needed. Overdosage: If you think you have taken too much of this medicine contact a poison control center or emergency room at once. NOTE: This medicine is only for you. Do not share this medicine with others. What if I miss a dose? If you miss a dose, skip it. Take your next dose at the normal time. Do not take extra or 2 doses at the same time to make up for the missed dose. What may interact with this medicine? This medicine may interact with the following medications:  aspirin and aspirin-like medicines  carbamazepine  certain medicines that treat or prevent blood clots like warfarin, enoxaparin, dalteparin, apixaban, dabigatran, and rivaroxaban  erythromycin  ketoconazole  phenytoin  rifampin  St. John's Wort This list may not describe all possible interactions. Give your health care provider a list of all the medicines, herbs, non-prescription drugs, or dietary supplements you use. Also tell them if you smoke, drink alcohol, or use illegal drugs. Some items may interact with your medicine. What should I watch for while using this medicine? Tell your doctor or healthcare professional if your symptoms do not start to get better or if they get worse. You may need blood work done while you are taking this medicine. Drink plenty of fluids while taking this medicine. Check with your doctor or health care professional if you get diarrhea, nausea, and vomiting. The loss of too much body fluid can make it dangerous for you to take this medicine. If you smoke, tell your doctor if you notice this medicine is not working well for you. Talk to your doctor  if you are a smoker or if you decide to stop smoking. This medicine may increase your risk to bruise or bleed. Call your doctor or health care professional if you notice any unusual bleeding. Do not become pregnant while taking this medicine or for 3 months after stopping it. Pregnancy testing must be  done before starting treatment with this medicine. Talk with your doctor or health care professional about your birth control options while taking this medicine. Women should inform their doctor if they wish to become pregnant or think they might be pregnant. There is a potential for serious side effects to an unborn child. Talk to your health care professional or pharmacist for more information. Do not breast-feed an infant while taking this medicine. What side effects may I notice from receiving this medicine? Side effects that you should report to your doctor or health care professional as soon as possible:  allergic reactions like skin rash, itching or hives, swelling of the face, lips, or tongue  diarrhea  signs and symptoms of bleeding such as bloody or black, tarry stools; red or dark-brown urine; spitting up blood or brown material that looks like coffee grounds; red spots on the skin; unusual bruising or bleeding from the eye, gums, or nose  signs and symptoms of a blood clot such as breathing problems; changes in vision; chest pain; severe, sudden headache; pain, swelling, warmth in the leg; trouble speaking; sudden numbness or weakness of the face, arm or leg  signs and symptoms of a dangerous change in heartbeat or heart rhythm like chest pain; dizziness; fast or irregular heartbeat; palpitations; feeling faint or lightheaded, falls; breathing problems  signs and symptoms of liver injury like dark yellow or brown urine; general ill feeling or flu-like symptoms; light-colored stools; loss of appetite; nausea; right upper belly pain; unusually weak or tired; yellowing of the eyes or skin  signs and symptoms of a stroke like changes in vision; confusion; trouble speaking or understanding; severe headaches; sudden numbness or weakness of the face, arm or leg; trouble walking; dizziness; loss of balance or coordination  stomach pain or swelling  vomiting Side effects that usually do not  require medical attention (report to your doctor or health care professional if they continue or are bothersome):  headache  loss of appetite  nausea  weight loss This list may not describe all possible side effects. Call your doctor for medical advice about side effects. You may report side effects to FDA at 1-800-FDA-1088. Where should I keep my medicine? Keep out of the reach of children. Store at room temperature between 20 and 25 degrees C (68 and 77 degrees F). Throw away any unused medicine after the expiration date. NOTE: This sheet is a summary. It may not cover all possible information. If you have questions about this medicine, talk to your doctor, pharmacist, or health care provider.  2020 [employer] Standard (2018-09-21 08:18:51)

## 2019-07-21 NOTE — Assessment & Plan Note (Addendum)
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

## 2019-07-21 NOTE — Assessment & Plan Note (Signed)
-   Stable - Ambulatory O2 92% RA after 2 laps; no oxygen desaturation

## 2019-07-21 NOTE — Assessment & Plan Note (Deleted)
-   Ambulatory O2 92% RA after 2 laps; no oxygen desaturation

## 2019-07-21 NOTE — Assessment & Plan Note (Addendum)
-   Tested positive for COVID-19 on 11/18; admitted for covid pneumonia on 11/29  treated with remdesivir/Decadron - Clinically appears well, she continues to have productive cough with some mucus production and wheezing. VSS, O2 stable. Eating and drinking. Strength looks ok, ambulating with walker.  - CXR 07/20/2019 showed mild-to-moderate severity diffuse interstitial infiltrates most consistent with previous covid-19 pneumonia and underlying ILD - Recommend patient continue Mucinex 600mg  twice daily - If continues to have productive cough consider course oral azithromycin

## 2019-07-24 ENCOUNTER — Telehealth: Payer: Self-pay | Admitting: Internal Medicine

## 2019-07-24 NOTE — Telephone Encounter (Signed)
Pharmacy updated in pt chart.  Nothing further needed at this time- will close encounter.

## 2019-07-25 ENCOUNTER — Telehealth: Payer: Self-pay | Admitting: Internal Medicine

## 2019-07-25 NOTE — Telephone Encounter (Signed)
Called the patient back and confirmed the prescription was sent in. She needed to know the directions for taking it. Advised her based on the office notes from 07/20/19 and the prescription sent in it was 1 tablet daily for 1 week, then 2 tablets daily after that. Advised her the information would be on the medication received as well. She asked if her husband could call if he had any questions, I said as long as he is on the DPR he can and he can also use the MyChart access to forward a question if needed. She voiced understanding. Nothing further needed at this time.

## 2019-07-26 DIAGNOSIS — M069 Rheumatoid arthritis, unspecified: Secondary | ICD-10-CM | POA: Diagnosis not present

## 2019-07-26 DIAGNOSIS — J9621 Acute and chronic respiratory failure with hypoxia: Secondary | ICD-10-CM | POA: Diagnosis not present

## 2019-07-26 DIAGNOSIS — U071 COVID-19: Secondary | ICD-10-CM | POA: Diagnosis not present

## 2019-07-26 DIAGNOSIS — S2242XD Multiple fractures of ribs, left side, subsequent encounter for fracture with routine healing: Secondary | ICD-10-CM | POA: Diagnosis not present

## 2019-07-26 DIAGNOSIS — K219 Gastro-esophageal reflux disease without esophagitis: Secondary | ICD-10-CM | POA: Diagnosis not present

## 2019-07-26 DIAGNOSIS — I951 Orthostatic hypotension: Secondary | ICD-10-CM | POA: Diagnosis not present

## 2019-07-27 ENCOUNTER — Telehealth: Payer: Self-pay | Admitting: Primary Care

## 2019-07-27 NOTE — Telephone Encounter (Signed)
Spoke with [facility] and verified the Ofev rx and gave MR's name and NPI as supervising MD  Faxed the med list to the number provided  Dx code provided Nothing further needed

## 2019-07-28 DIAGNOSIS — I951 Orthostatic hypotension: Secondary | ICD-10-CM | POA: Diagnosis not present

## 2019-07-28 DIAGNOSIS — S2242XD Multiple fractures of ribs, left side, subsequent encounter for fracture with routine healing: Secondary | ICD-10-CM | POA: Diagnosis not present

## 2019-07-28 DIAGNOSIS — M069 Rheumatoid arthritis, unspecified: Secondary | ICD-10-CM | POA: Diagnosis not present

## 2019-07-28 DIAGNOSIS — U071 COVID-19: Secondary | ICD-10-CM | POA: Diagnosis not present

## 2019-07-28 DIAGNOSIS — K219 Gastro-esophageal reflux disease without esophagitis: Secondary | ICD-10-CM | POA: Diagnosis not present

## 2019-07-28 DIAGNOSIS — J9621 Acute and chronic respiratory failure with hypoxia: Secondary | ICD-10-CM | POA: Diagnosis not present

## 2019-07-31 DIAGNOSIS — S2242XD Multiple fractures of ribs, left side, subsequent encounter for fracture with routine healing: Secondary | ICD-10-CM | POA: Diagnosis not present

## 2019-07-31 DIAGNOSIS — K219 Gastro-esophageal reflux disease without esophagitis: Secondary | ICD-10-CM | POA: Diagnosis not present

## 2019-07-31 DIAGNOSIS — U071 COVID-19: Secondary | ICD-10-CM | POA: Diagnosis not present

## 2019-07-31 DIAGNOSIS — I951 Orthostatic hypotension: Secondary | ICD-10-CM | POA: Diagnosis not present

## 2019-07-31 DIAGNOSIS — M069 Rheumatoid arthritis, unspecified: Secondary | ICD-10-CM | POA: Diagnosis not present

## 2019-07-31 DIAGNOSIS — J9621 Acute and chronic respiratory failure with hypoxia: Secondary | ICD-10-CM | POA: Diagnosis not present

## 2019-08-02 DIAGNOSIS — M069 Rheumatoid arthritis, unspecified: Secondary | ICD-10-CM | POA: Diagnosis not present

## 2019-08-02 DIAGNOSIS — S2242XD Multiple fractures of ribs, left side, subsequent encounter for fracture with routine healing: Secondary | ICD-10-CM | POA: Diagnosis not present

## 2019-08-02 DIAGNOSIS — J9621 Acute and chronic respiratory failure with hypoxia: Secondary | ICD-10-CM | POA: Diagnosis not present

## 2019-08-02 DIAGNOSIS — K219 Gastro-esophageal reflux disease without esophagitis: Secondary | ICD-10-CM | POA: Diagnosis not present

## 2019-08-02 DIAGNOSIS — U071 COVID-19: Secondary | ICD-10-CM | POA: Diagnosis not present

## 2019-08-02 DIAGNOSIS — I951 Orthostatic hypotension: Secondary | ICD-10-CM | POA: Diagnosis not present

## 2019-08-03 ENCOUNTER — Encounter: Payer: Self-pay | Admitting: Internal Medicine

## 2019-08-03 ENCOUNTER — Ambulatory Visit (INDEPENDENT_AMBULATORY_CARE_PROVIDER_SITE_OTHER): Payer: Medicare Other | Admitting: Internal Medicine

## 2019-08-03 ENCOUNTER — Other Ambulatory Visit: Payer: Self-pay

## 2019-08-03 VITALS — BP 142/78 | HR 60 | Temp 97.6°F | Ht 66.0 in | Wt 137.0 lb

## 2019-08-03 DIAGNOSIS — K219 Gastro-esophageal reflux disease without esophagitis: Secondary | ICD-10-CM | POA: Diagnosis not present

## 2019-08-03 DIAGNOSIS — M359 Systemic involvement of connective tissue, unspecified: Secondary | ICD-10-CM

## 2019-08-03 DIAGNOSIS — J8489 Other specified interstitial pulmonary diseases: Secondary | ICD-10-CM

## 2019-08-03 DIAGNOSIS — Z5181 Encounter for therapeutic drug level monitoring: Secondary | ICD-10-CM | POA: Diagnosis not present

## 2019-08-03 DIAGNOSIS — J45901 Unspecified asthma with (acute) exacerbation: Secondary | ICD-10-CM

## 2019-08-03 MED ORDER — DOXYCYCLINE HYCLATE 100 MG PO TABS: 100.0000 mg | ORAL_TABLET | Freq: Two times a day (BID) | ORAL | 0 refills | Status: DC

## 2019-08-03 MED ORDER — PREDNISONE 10 MG PO TABS: ORAL_TABLET | ORAL | 0 refills | Status: DC

## 2019-08-03 NOTE — Progress Notes (Signed)
Brief patient profile:  50 yowf  never smoker with allergies/inhalers as child outgrew by [employer] then  RA since around 2000  Prednisone  x decades and prev eval by Dr Joya Gaskins around 2004 for sob resolved s maint rx and referred 05/26/2013 by Dr Shelia Media for bronchitis and abn cxr   History of Present Illness  05/26/2013 1st [facility] office visit/ Wert cc June 2014 dx pna  In [place] and remicade stopped and 100% better and placed arencia in September 2014  then abruptly worse first week in November with cough green sputum s nasal symptoms, fever low grade and no cp or cough and completely recovered prior to [place] does not recall abx but issue is why keeps getting sick and abn CT Chest (see below).   Arthritis symptoms well controlled at present on Rx for RA rec Nexium 40 mg Take 30-60 min before first meal of the day and add pepcid 20 mg one at bedtime whenever coughing.     10/19/2017  f/u ov/Wert re:  RA lung dz  Chief Complaint  Patient presents with  . Follow-up    Cough is much improved, but has not resolved yet. Cough is non prod. She has not had to use her neb.   Dyspnea:  Not limited by breathing from desired activities  But some doe x steps Cough: daytime > noct dry  Sleep: fine  SABA use:  No saba Medrol 4 mg a/w 8m per day/ ok control of arthritis  rec Start back on gabapentin up to 300 mg each am  in addition to the the two at bedtime  If not better increase the medrol to 8 mg daily until bettter then taper back to where you      01/10/2018  f/u ov/Wert re:  RA  Lung dz Medrol  4 mg  One alternating with a half Chief Complaint  Patient presents with  . Follow-up    PFT's done. Her breathing has been gradually worsening since the last visit. She has occ cough- non prod.   Dyspnea gradually worse since last ov:  MMRC1 =  MMRC3 = can't walk 100 yards even at a slow pace at a flat grade s stopping due to sob    Gradually x 3 m / more fatigue / no change in  arthritis  Cough: not an issue rec Protonix 40 mg Take 30-60 min before first meal of the day  GERD diet   01/17/2018 acute extended ov/Wert re: cough on medrol 4 mg  One a/w one half  Chief Complaint  Patient presents with  . Acute Visit    started coughing 01/11/18- occ prod with minimal green sputum.  She states also wheezing and having increased SOB.    abruptly worse 01/11/18 with severe 24/7 coughing >>  prod min green mucus esp in am/ assoc with subjective wheeze and did not follow previous contingencies re flutter / saba/ increase medrol and admits she does not rember those written instructions nor how to use the neb provided .  No fever/ comfortable at rest sitting  rec For cough > mucinex dm 1200 mg every 12 hours and cough into the flutter valve as much as possible  Doxycycline 100 mg twice daily x 10 days with glass of water Medrol 432mx 2 now and take 2  daily until cough is better then 1 daily x 5 days and then resume the previous dose  Shortness of breath/ wheezing/ still coughing >  albuterol neb every 4 hours as needed      01/20/2018 acute extended ov/Wert re: refractory cough and sob 01/11/18 Chief Complaint  Patient presents with  . Acute Visit    she is not feeling better, coughing , very SOB, wheezing  mucus now clear/scant  on doxy/ neb machine not working (tube would not plug into the side s adequate force and she was not capable of applying it due to RA hands. Cough/ wheeze/ sob 24/7 / flutter not helping/ can't lie down at hs   rec While coughing protonix 40 mg Take 30- 60 min before your first and last meals of the day  Shortness of breath/ wheezing/ still coughing > albuterol neb every 4 hours as needed  Depomedrol 120 mg IM and medrol 32 mg daily x 2 days,  then 16 mg x 3 days,  Then 8 mg x 4 days , then resume the 4 mg daily  For severe cough > tylenol 3# one every 4 hours if needed  Go to ER if condition worsens on above plan       Date of admission:  01/22/2018             Date of discharge: 01/27/2018   History of present illness: As per the H and P dictated on admission, "[name] a79 y.o.female,w Rheumatoid arthritis, ILD Asthma, apparently c/o increase in dyspnea this evening. Dry cough. Denies fever, chills, cp, palp, N/v, diarrhea, brbpr, black stool. Pt notes recently being given steroid injection in office as well as being placed on doxycycline. This might have helped slightly but pt worse Hospital Course:  Summary of her active problems in the hospital is as following. 1 dyspnea/hypoxemia/ILD Concerned that likely GERD Is causing ILD. Patient with cough.  Patient with complaints of awakening with cough and also with oral intake which is slightly improved since 01/24/2018. assessed by speech therapy and speech therapy raising concern of esophageal component but no signs of aspiration.  2D echo with a EF of 55 to 60% with no wall motion abnormalities, grade 1 diastolic dysfunction.  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 Pulmonary felt that the patient should be on scheduled Reglan. I have placed the patient on scheduled potassium before sleep. Continue steroids on discharge continue Mucinex and Claritin as well as inhalers. Patient will follow-up with pulmonary outpatient  2. Gastroesophageal reflux disease Continue PPI and H2 blocker.I changed PPI to [facility].  3. Rheumatoid arthritis Outpatient follow-up.   4. Anxiety Continue Effexor.    All other chronic medical condition were stable during the hospitalization.  Patient was ambulatory without any assistance. On the day of the discharge the patient's vitals were stable , and no other acute medical condition were reported by patient. the patient was felt safe to be discharge at home with family.  Consultants: PCCM  Procedures: Echocardiogram      03/21/2018  f/u ov/Wert re:   S/p admit was  transiently better  and downhill since Labor day on medrol 4 mg daily  Last orencia on Sept 4th 2019  Chief Complaint  Patient presents with  . Acute Visit    Per patient, she has had a dry cough since July 2019. She has been wheezing as well. Increased fatigue. Body aches. Denies any fever or chills.   Dyspnea:  MMRC4  = sob if tries to leave home or while getting dressed   Cough: harsh/ hacking mostly dry/ has flutter not using    SABA  use: not much better with rx   No obvious day to day or daytime variability or assoc excess/ purulent sputum or mucus plugs or hemoptysis or cp or chest tightness, subjective wheeze or overt sinus or hb symptoms.     Also denies any obvious fluctuation of symptoms with weather or environmental changes or other aggravating or alleviating factors except as outlined above   No unusual exposure hx or h/o childhood pna/ asthma or knowledge of premature birth.   INpatient consult 03/26/18 79 year old with rheumatoid arthoritis.At baseline the patient lives at home with her husband and is independent of ADLs.  Has been on many immune suppressants over decades and curently on orencia x 4 year and prednisone. Does not recollect being on bactrim/dapsone. Chart mentions BOOP/MAI in 2001 but she denies this. 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 Got admitted. Rx with steroids. Per Notes - clnical suspicion of  arytenoid inflmmation related wheeze noticed (she also reports asthma NOS). Follolwup with ENT recommended (but not seen one as yet). She also appears to have passed swallow with rec for regular diet with thin liquids but did to have mild eso stricture and reflux during testing . PFT shows 10% FVC decline for first ime. CT chest at this tme (aug 2019) showed new rLL infiltrate. ECHO July 2019 without evicence of  elevated PASP and saw cards [facility] June 2019 and was considered to have worsenin dyspnea due to [facility] issues (reports stress test at [facility] that was normal but I cannot see it)  She reports after discharge she got better but in last several weeks has deteriorated with cough and dyspnea. There is new hypoxemia (currently RA with nail polish and poor circulation - 89% pulse ox) needing 2L [place]. Per [facility] improved with steroids and abx. CTA 03/24/2018 => shows that RLL inifltrate has improved . Other chronic ILD changes + and small  Hiatal hernia + witthout change.  Review of lab work does show eosinophilia at time of admision  EVENTS 03/21/18 - IgE -5, blood allegy panel - negative, 03/23/2018 - - admit . HIGH EOS 2300, ESR 48, BNP 89 , HIV neg 9/12- PCT negative, RVP negative 9/14 -  PCT < 0.1, Urine strep - negative, MRSA PCR - positive. IgE - normal 4, Blood allergy panel repeat - negative 03/26/2018 - leading consideration for airway (BO in RA +/- asthma) related flare either due to MRSA bronchitis or clinical suspicion of arytenoid inflmmation +/- GERD relatd flare (she has small hiatal hernia)  +/- ? Dysphagia  up causing mild hypoxemia acute resp failure, wheeze . Allergy and IgE blood work negative thought. Patient reports being better but says she is choking on drinkin water [facility] MD says wheeze improved significantly with steroids.  RN says was down to RA yesterday evening but needed 1L [place] at sleep. Today -Room air at rest 94% and desaturated to 86% walking 60 feet 03/27/2018  - better. Off o2 at rest. STill coughs with water and when lies down.  Husband at bedside. Both requesting ILD clinic followup . Desaturated t 79% walking 90 feet.   OV 04/12/2018  Subjective:  Patient ID: Tamala Fothergill, female , DOB: 1941-02-17 , age 87 y.o. , MRN: [id number] , ADDRESS: [place] [address]   04/12/2018 -   Chief  Complaint  Patient presents with  . Consult    Pt is a former  [facility] pt.  Pt denies any current complaints of cough, SOB, or CP but states the cough she originally had ended her up in the hosp 9/11-9/17 with dx acute respiratory failure. Pt does wear 2pulse with exertion and also wears 2L continuous when at home.     HPI JAHNAI SLINGERLAND 79 y.o. -presents for follow-up to the ILD clinic.  She is known to have rheumatoid arthritis with ILD changes.  She had been followed by Dr. Christinia Gully.  However in July 2019 in September 2019 she has had 2 admissions to the hospital with respiratory distress and hypoxemic respiratory failure.  In the first 1 that seem to be right lower lobe infiltrate and then she improved from it but in the second 1 even though the right lower lobe infiltrates were better she still was hypoxemic.  Acid reflux and dysphagia was considered a possible etiology but she passed swallow study 2 times.  They thought she had some reflux.  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  At the same time it is not clear if the ILD is progressive based on pulmonary function testing below   At this point in time she tells me that she is getting home physical therapy.  Her fatigue is improving but it is not fully resolved.  She was discharged on continuous oxygen which she is using.  However she is feeling less short of breath.  Today in fact when we turned her oxygen off and walked her she did not desaturate and this is a significant improvement.  She is on monthly Orencia through the [facility] rheumatologist Dr. Eda Paschal.  At this point in time she is put the Orencia on hold.  She told me that she is been on Orencia for 4 years and never had a respiratory exacerbation still recently x 2.  Although before going on Orencia she had pneumonia while on Remicade and the Remicade.  In terms of her rheumatoid arthritis she hardly has any pain.  Her joint architecture is fairly well-preserved because of various  immunomodulators over time.  She says that she was on Remicade for years and when she stopped it for 8 weeks before the switch to [place] she never really had a relapse in her rheumatoid arthritis.  She is largely pain and stiffness free.  She believes she can go without  her Orencia for a while.  Review of the literature shows greater than 10% chance of a respiratory infection especially COPD exacerbation.  Although the time frame for this is unclear.       OV 06/07/2018  Subjective:  Patient ID: Tamala Fothergill, female , DOB: 1941/06/06 , age 5 y.o. , MRN: [id number] , ADDRESS: [place] [address]   06/07/2018 -   Chief Complaint  Patient presents with  . Follow-up    ILD, PFT done today, some wheezing and coughing but better tha before   Rheumatoid arthritis ILD and asthma/obstructive lung disease phenotype on Dulera  HPI ELLENA KAMEN 79 y.o. -presents for routine follow-up with her husband.  She is here to follow-up with [facility] Dr. Stann Mainland.  She plans to do this in December 2019.  She continues to be off Orencia.  Her joints are slowly getting stiff again.  She believes that she will need to be back on immunosuppression agent again.  She currently  continues Medrol 4 mg alternating with 2 mg.  This for her rheumatoid arthritis.  In terms of her joints she continues on Medrol 4 mg alternating with 2 mg but not on any other immunosuppression agent.  Overall she is been stable but for the last 2 weeks has had green sputum and wheezing and chest congestion and cough.  She recently visited her husband who was hospitalized and walking the long hallways at [facility] made a short of breath but she thinks this is probably baseline for her.  There are no other new issues.  She did have spirometry and DLCO and this shows a decline compared to September 2019 and a similar to July 2019.  It is documented below.  This is probably reflective of a  flareup   OV 07/21/2018  Subjective:  Patient ID: Tamala Fothergill, female , DOB: 1941/05/03 , age 39 y.o. , MRN: [id number] , ADDRESS: [place] [address]   07/21/2018 -   Chief Complaint  Patient presents with  . Follow-up    Pt states she has been doing well since last visit. States she is about to begin Rituxan with [facility]. Pt still becomes SOB with exertion. Denies any complaints of cough or CP.   Rheumatoid arthritis ILD and asthma/obstructive lung disease phenotype on Dulera  HPI JASIRA ROBINSON 79 y.o. -presents for follow-up of the above.  Last seen just before Thanksgiving 2019.  In the interim overall stable although on June 23, 2018 she climbs a steep flight of stairs which is unusual exertion for her and she became very dyspneic.  Following day saw Dr. Stann Mainland at [facility] rheumatology and was given Z-Pak and prednisone and started feeling better.  Although it is not fully clear to me she had fever and bronchitic symptoms.  I reviewed Dr. Stann Mainland note.  Dr. Stann Mainland is decided to start Rituxan for rheumatoid arthritis.  She is only having some minimal joint pain at this point.  She is off Orencia and continues to be off [place].  She did have some blood work with [place] before starting Rituxan.  She is due to see Dr. Stann Mainland within the next week and start her Rituxan.  Her liver function test July 18, 2017 is normal hemoglobin is normal.  CRP is also normal.  We did spirometry and walking desaturation test.  These show improvement compared to before and these are documented below.  Currently she not using nighttime or daytime oxygen.  She is wondering if she could switch rheumatology care to [facility].  This is because while she likes [employer] she is getting older and more frail and feels some body local would be of help.  I have sent a message to Dr. Estanislado Pandy inquiring.  Certainly we can help her with Rituxan infusions at [facility]  if needed.  She will check on this with her [facility] rheumatologist.       OV 03/27/2019  Subjective:  Patient ID: Tamala Fothergill, female , DOB: 12/31/1940 , age 3 y.o. , MRN: [id number] , ADDRESS: [place] [address]  Rheumatoid arthritis ILD and asthma/obstructive lung disease phenotype on [facility]   03/27/2019 -  Rourine fu   HPI TENELLE ANDREASON 79 y.o. -presents for the above.  Last seen in January 2020.  After that she has seen Dr. Stann Mainland rheumatology at [facility].  She is now getting Rituxan 2 doses every 6 months.  She says this  is helped her joints and her stiffness.  She is a little bit more mobile than usual.  However in terms of her respiratory status she continues to have episodic cough.  In June 2020 she again got hypoxemic and got admitted.  Since then she has had episodic cough.  She had a respiratory exacerbation in June 2020 for the admission she got steroids.  This seemed to help.  She is also on a higher dose Dulera right now.  In terms of her cough this seems to be her biggest problem.  She seems to be on Dulera, Singulair scheduled with also Tessalon and Delsym and DuoNeb.  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  Her last high-resolution CT scan of the chest was 1 year ago.  She says the dyspnea itself is not worse.  She is currently not using oxygen.  On exam she did have some wheezing.  Currently she has white and brown sputum but this is baseline.  In terms of a COVID wrist she has been tested recently couple of times and this is been negative.  She is isolating well.  She wanted to know COVID prevention activities and risk status and masking strategies.      OV 05/24/2019  Subjective:  Patient ID: Tamala Fothergill, female , DOB: May 20, 1941 , age 86 y.o. , MRN: [id number] , ADDRESS: [place]  [id number]   05/24/2019 -   Chief Complaint  Patient presents with  . Follow-up    Pt was recently in the hosp due to ILD. Pt states that she has been better since being out of the hosp.   Rheumatoid arthritis ILD and asthma/obstructive lung disease phenotype on Dulera/Singulair  Post herpetic neuralgia - on elavil, gabapentin, effexor  HPI HELLEN SHANLEY 79 y.o. -returns for follow-up.  At the last visit approximately 2 months ago she was reporting worsening cough following an admission in summer 2020.  Her pulmonary function test suggested worsening ILD status.  Therefore we requested a high-resolution CT chest which she did in October 2020.  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  However in the interim after the CT scan was done towards the end of October 2020 she developed worsening of her cough over 2 weeks and also associated shortness of breath but significantly the cough is much worse.  She ended up getting admitted to the hospital.  There was some hypoxemia.  By this time she had finished a ENT evaluation that did not show any involvement of the arytenoids.  Pulmonary was consulted.  She was given a prednisone burst which she just finished I believe yesterday.  She is back on her baseline Medrol.  And she is feeling better.  Her oxygen status is improved although she is using oxygen at night now.  She is really frustrated with these recurrent flareups and these admissions which ended up with her having a wheeze and also hypoxemia.  Currently she is on her baseline Medrol for rheumatoid arthritis associated with Dulera and Singulair.  She is also on losartan for blood pressure.  Her walking desaturation test is slightly worse than baseline.  She has a GI consult pending because of the recurrent episodes of cough and flareups.  We went over exposure history.  We used interstitial lung disease questionnaire for the exposure history.  Specifically she denies any  electronic cigarette use of marijuana use of cocaine use or any IV drug abuse.  [place] the last 14 years.  Asked extensive questions about the home environment it is positive for nebulizer use but the nebulizer does not have mildew or mold in it.  Otherwise no organic antigen exposure.  The house is not damp.  There is no mold or mildew in the shower curtain.  There is no humidifier use no steam iron use.  No Jacuzzi use.  No misting [place] outside to inside the house.  No pet birds.  No pet gerbils no feather pillows.  There is no mold in the [facility] duct.  She does not do any gardening.  Does not use wind instruments.  Also 122 question occupational history elicited and essentially negative.  The other issue is that she has polypharmacy.  She is asking for my help in reducing her medications.  She is on 3 medications for postherpetic neuralgia.  She is on losartan      OV 06/21/2019  Subjective:  Patient ID: Tamala Fothergill, female , DOB: 15-Jul-1940 , age 41 y.o. , MRN: [id number] , ADDRESS: [place] [address]   06/21/2019 -   Chief Complaint  Patient presents with  . televisit    hosp 11/29-12/4 due to covid with pna. pt said that she is doing okay after recent hosp but states she has no energy.      MATSUE STROM 79 y.o. - last visit 05/24/2019. Diagnosed with covid-19 on 05/31/2019. Admitted 06/11/2019  - 06/16/2019. Quarantine ends 06/21/2019 today. Rx with Remdesviri and Decadron. Husband also on phone. Questions  1. Quarantine ends - from 06/22/19\ and she is not contagious and likely resistant to reinfection to covid for another few months  2. She is on 10 day dexamethasone for covid - today is last day for it  3. She is on dulera. Hospital gave combivent and she does not want do combivent - this is fine  4. Ofev for ILD - not started it yet . She had questions about side effects. Explained GI and  LFT monirtong. SHe wanted to wait till Jan 2021 and start it . I am ok with that.   5. Small hiat   IMPRESSION: HRCT OCt 2020 1. 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 Findings have progressed since 2017 and 2019 high-resolution chest CT studies. 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 Findings are consistent with UIP per consensus guidelines: Diagnosis of Idiopathic Pulmonary Fibrosis: An Official ATS/ERS/JRS/ALAT Clinical Practice Guideline. Page, Iss 5, [phone], Mar 13 2017. 2. 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 4. Small hiatal hernia.  Aortic Atherosclerosis (ICD10-I70.0).   Electronically Signed   By: Ilona Sorrel M.D.   On: 04/24/2019 13:29   OV 08/03/2019 - face to face visit  Subjective:  Patient ID: Tamala Fothergill, female , DOB: March 25, 1941 , age 17 y.o. , MRN: [id number] , ADDRESS: [place] [id number]    Rheumatoid arthritis with Joint s/ILD- Rituxan/steroids ([facility])  - progressive phenotype (CT 2017/2019 -> Oct 2020). Planned Ofev start in Nov 2020 but did not start as of dec 2020. LFT normal Jan 2021  Athma/obstructive lung disease phenotype on Engineer, production  herpetic neuralgia - on elavil, gabapentin, effexor  Coronary Artery Calcification - 1 vessel - cardiology referral done  Diagnosed with covid-19 on 05/31/2019. Admitted 06/11/2019  - 06/16/2019. Rx with Remdesviri and Decadron.  Small Hiatal hernia onCT Oct 2020  Rib fracture from fall Dec 2020: Musculoskeletal: There is nondisplaced fractures of the posterior left fifth, 6, 8, 9, and tenth left ribs. There is unchanged slight superior compression deformities of the T5, T6, T10 vertebral bodies with less than 25% loss in height.Associated  pulmnary contusion +    HPI MEGA KINKADE 79 y.o. -presents for a visit. Her nintedanib has been delayed because of COVID-19. Also subsequently a fall. She saw a nurse practitioner on July 20, 2019 and they took a shared decision to start nintedanib. She had a ? telephone visit with Dr. Candie Mile the rheumatologist at [facility]. Patient scheduled for her next Rituxan in February 2021 but reviewed the notes indicates this could be pushed to early spring 2021. Dr. Stann Mainland is okay with the patient study got intubated at this point in time.  Patient has follow-up with Dr. Stann Mainland tomorrow at [facility].  At this face-to-face visit she is here with her husband.  She says she is much better after the Covid and also the fall that fractured her ribs.  Nevertheless all this is left her fatigue.  Infective fatigue scores are much worse.  Also in the last 2 weeks has had increased cough wheezing and shortness of breath.  The sputum was also changed color in the last 1 week to clear green.  This is all new.  Her symptom score is therefore a worse than her baseline.  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  She is only taking 100 mg once a day.  The plan was to go up to 100 mg twice a day next week.  This is a minimum effective dose.  The maximum dose is 150 mg twice a day.  Given her age and comorbidities we are starting at a low dose.  Part of this visit is to make sure that she is tolerating the drug fine.  And so far she is.  She is interested in the ILD-pro registry study done by the [facility].   She is working with physical therapy for her fatigue.  Ambulatory Walk 07/20/2019 2 Lap- O2 92% RA; HR 109 No shortness of breath She did not need to stop Used walker    SYMPTOM SCALE - ILD 03/27/2019  05/24/2019  08/03/2019 Post covid Post fall rib #  O2 use ra    Shortness of Breath 0 -> 5  scale with 5 being worst (score 6 If unable to do)    At rest 0 2 3  Simple tasks - showers, clothes change, eating, shaving 0 3 2  Household (dishes, doing bed, laundry) '2 4 5  '$ Shopping '2 3 6  '$ Walking level at own pace '2 3 4  '$ Walking keeping up with others of same age '3 4 4  '$ Walking up Stairs '3 3 5  '$ Walking up [place] '3 4 6  '$ Total (40 - 48) Dyspnea Score 15 26   How bad is your cough? '2 2 3  '$ How bad is your fatigue 3 3.5 5    Results for SHAMIRAH, IVAN (MRN [id number]) as of 03/27/2019 09:43  Ref. Range 07/17/2013 09:53 02/07/2016 13:09 01/11/2017 11:05  01/10/2018 10:13 05/17/2018 14:24 06/07/2018 10:57 07/21/2018 10:53 03/27/2019 09:02  FVC-Pre Latest Units: L 2.13 2.16 2.14 1.93 1.99 1.81 2.18 1.79  FVC-%Pred-Pre Latest Units: % 68 71 71 65 75 68 77 64   Results for MARGUARITE, MARKOV (MRN [id number]) as of 03/27/2019 09:43  Ref. Range 07/17/2013 09:53 02/07/2016 13:09 01/11/2017 11:05 01/10/2018 10:13 05/17/2018 14:24 06/07/2018 10:57 07/21/2018 10:53 03/27/2019 09:02  DLCO unc Latest Units: ml/min/mmHg 18.85 18.25 16.50 16.09 16.58 14.06  15.04  DLCO unc % pred Latest Units: % 69 67 61 59 70 59  77    Simple office walk  feet x  3 laps goal with forehead probe 04/12/2018  07/21/2018  05/24/2019   O2 used Room air - off o2 x 10 min Room air  Room air  Number laps completed 3 x 185 feet 3 x 250 feet   Comments about pace normal normal   Resting Pulse Ox/HR 99% and 72/min 97% and 74/min 97% and 81/min  Final Pulse Ox/HR 98% and 95/min 96% and 96/min 94% and 107/min  Desaturated </= 88% no no no  Desaturated <= 3% points no no Yes, 3 pints  Got Tachycardic >/= 90/min yes yes yes  Symptoms at end of test No complaint Mild dyspnea Mod dyspnea  Miscellaneous comments improed from hospital Same v improved woprse     ROS - per HPI     has a past medical history of Abnormal finding of blood chemistry, Asthma, H/O measles, H/O varicella, Hypertension, 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[place]), 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   reports that she has never smoked. She has never used smokeless tobacco.  Past Surgical History:  Procedure Laterality Date  . CHOLECYSTECTOMY  2011  . WISDOM TOOTH EXTRACTION      Allergies  Allergen Reactions  . Penicillins Other (See Comments)    Reaction unknown occurred during childhood Has patient had a PCN reaction causing immediate rash, facial/tongue/throat swelling, SOB or lightheadedness with hypotension: Unknown Has patient had a PCN reaction causing severe rash involving mucus membranes or skin necrosis: Unknown Has patient had a PCN reaction that required hospitalization: No Has patient had a PCN reaction occurring within the last 10 years: No If all of the above answers are "NO", then may proceed with Cephalosporin use.  Unsur  . Remicade [Infliximab] Other (See Comments)    Reaction unknown  . Sulfa Antibiotics Other (See Comments)    Reaction unknown  . Sulfamethoxazole Other (See Comments)    Unsure of reaction As a child not sure     Immunization History  Administered Date(s) Administered  . Influenza Split 04/12/2013, 04/12/2014, 03/14/2015, 04/23/2016  . Influenza, High Dose Seasonal PF 04/12/2017, 03/24/2018, 04/13/2019  . Influenza-Unspecified 04/03/2013, 04/22/2017  . Pneumococcal Conjugate-13 04/03/2013, 09/28/2014  . Pneumococcal Polysaccharide-23 07/13/2012, 07/24/2013  . Td 01/11/2018  . Tdap 12/10/2017    Family History  Problem Relation Age of Onset  . Asthma Mother   . Anemia Mother   . Polymyalgia rheumatica Mother   . COPD Father   . Pulmonary fibrosis Father   . Breast cancer Maternal Grandmother 39     Current Outpatient Medications:  .  acetaminophen (TYLENOL) 325 MG tablet, Take 2 tablets (650 mg total) by mouth every 6 (six) hours  as needed for mild pain or headache (fever >/= 101)., Disp:  , Rfl:  .  albuterol (VENTOLIN HFA)  108 (90 Base) MCG/ACT inhaler, Inhale 2 puffs into the lungs every 6 (six) hours as needed for wheezing or shortness of breath., Disp: 18 g, Rfl: 0 .  ALPRAZolam (XANAX) 0.25 MG tablet, Take 1 tablet (0.25 mg total) by mouth 3 (three) times daily as needed for anxiety., Disp: 20 tablet, Rfl: 0 .  amitriptyline (ELAVIL) 25 MG tablet, Take 25 mg by mouth at bedtime., Disp: , Rfl: 0 .  benzonatate (TESSALON) 200 MG capsule, Take 1 capsule (200 mg total) by mouth 3 (three) times daily as needed for cough., Disp: 20 capsule, Rfl: 2 .  beta carotene w/minerals (OCUVITE) tablet, Take 1 tablet by mouth daily., Disp: , Rfl:  .  cholecalciferol (VITAMIN D) 1000 UNITS tablet, Take 1,000 Units by mouth daily., Disp: , Rfl:  .  dextromethorphan (DELSYM) 30 MG/5ML liquid, Take 2.5 mLs (15 mg total) by mouth at bedtime., Disp: 89 mL, Rfl: 0 .  gabapentin (NEURONTIN) 300 MG capsule, Take 600 mg by mouth at bedtime., Disp: , Rfl:  .  guaiFENesin (MUCINEX) 600 MG 12 hr tablet, Take 1 tablet (600 mg total) by mouth 2 (two) times daily., Disp: 30 tablet, Rfl: 1 .  ipratropium-albuterol (DUONEB) 0.5-2.5 (3) MG/3ML SOLN, Take 3 mLs by nebulization 3 (three) times daily. (Patient taking differently: Take 3 mLs by nebulization every 6 (six) hours as needed (sob). ), Disp: 360 mL, Rfl: 0 .  lidocaine (LIDODERM) 5 %, Place 1 patch onto the skin daily. Remove & Discard patch within 12 hours or as directed by MD, Disp: 30 patch, Rfl: 0 .  methylPREDNISolone (MEDROL) 4 MG tablet, Take 1 tablet (4 mg total) by mouth every other day. Alternate with the '2mg'$  dose, Disp:  , Rfl:  .  mometasone-formoterol (DULERA) 100-5 MCG/ACT AERO, Inhale 2 puffs into the lungs 2 (two) times daily., Disp: 13 g, Rfl: 0 .  Nintedanib (OFEV) 100 MG CAPS, Take 100 mg everyday by mouth x 1 week, then increase to 100 mg twice a day., Disp: 60 capsule, Rfl: 6 .   pantoprazole (PROTONIX) 40 MG tablet, Take 1 tablet (40 mg total) by mouth 2 (two) times daily before a meal., Disp: 180 tablet, Rfl: 3 .  polyethylene glycol (MIRALAX / GLYCOLAX) 17 g packet, Take 17 g by mouth daily as needed for mild constipation., Disp: , Rfl:  .  pravastatin (PRAVACHOL) 20 MG tablet, Take 1 tablet (20 mg total) by mouth every evening., Disp: 90 tablet, Rfl: 3 .  riTUXimab (RITUXAN) 100 MG/10ML injection, Inject into the vein. [facility]ce[facility] at [facility] but unsure of dose - no more doses for next 6 months as of 09/14/18, Disp: , Rfl:  .  venlafaxine XR (EFFEXOR-XR) 75 MG 24 hr capsule, Take 75 mg by mouth at bedtime. , Disp: , Rfl:       Objective:   Vitals:   08/03/19 1133  BP: (!) 142/78  Pulse: 60  Temp: 97.6 F (36.4 C)  SpO2: 91%  Weight: 137 lb (62.1 kg)  Height: '5\' 6"'$  (1.676 m)    Estimated body mass index is 22.11 kg/m as calculated from the following:   Height as of this encounter: '5\' 6"'$  (1.676 m).   Weight as of this encounter: 137 lb (62.1 kg).  '@WEIGHTCHANGE'$ @  [employer]   08/03/19 1133  Weight: 137 lb (62.1 kg)     Physical Exam Elderly frail female.  Has a walker.  Has significant clubbing.  Has diffuse wheezing.  Oral cavity is normal on  exam with the mask removed.  Looks stable.         Assessment:       ICD-10-CM   1. Interstitial lung disease due to connective tissue disease ([place])  J84.89 Hepatic function panel   M35.9   2. Encounter for therapeutic drug monitoring  Z51.81 Hepatic function panel  3. Exacerbation of asthma, unspecified asthma severity, unspecified whether persistent  J45.901   4. 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 esophagitis  K21.9        Plan:     Patient Instructions     ICD-10-CM   1. Interstitial lung disease due to connective tissue disease ([place])  J84.89    M35.9   2. Encounter for therapeutic drug monitoring  Z51.81   3. Exacerbation of asthma, unspecified asthma severity, unspecified whether persistent  J45.901   4. GERD  without esophagitis  K21.9    #asthma  - in flare up - Take prednisone 40 mg daily x 2 days, then '20mg'$  daily x 2 days, then '10mg'$  daily x 2 days, then '5mg'$  daily x 2 days and then baseline regimen - Take doxycycline '100mg'$  po twice daily x 5 days; take after meals and avoid sunlight (if nausea hold ofev during this time) - control acid reflux  #ILD due to RA  - continue ofev '100mg'$  once daily (if nausea - hold ofev while on doxy)  - increase ofev to '100mg'$  twice daily after completing doxy course  - stay on this dose for 1-3 months before we meet and increase it - ok for Rituxan in setting of Ofev  - covid-19 vaccine sometime in feb 2021/march 2021 is fine (pay attention go govt regulation on safety) - meet with Verline Lema 08/03/2019  - research assistant at [facility] for ILD-PRO study  Followup - check LFT in 4 weeks  - 4 weeks - either 15 min telephine vist or face to face 30 min with Dr Chase Caller in ILD clinic      SIGNATURE    Dr. Brand Males, M.D., F.C.C.P,  Pulmonary and Critical Care Medicine Staff Physician, [place] Director - Interstitial Lung Disease  Program  Pulmonary [place] at [place], [place], [address]  Pager: [phone], If no answer or between  15:00h - 7:00h: call [phone] Telephone: [phone]  12:16 PM 08/03/2019

## 2019-08-03 NOTE — Patient Instructions (Addendum)
ICD-10-CM   1. Interstitial lung disease due to connective tissue disease ([place])  J84.89    M35.9   2. Encounter for therapeutic drug monitoring  Z51.81   3. Exacerbation of asthma, unspecified asthma severity, unspecified whether persistent  J45.901   4. 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 esophagitis  K21.9    #asthma  - in flare up - Take prednisone 40 mg daily x 2 days, then 20mg  daily x 2 days, then 10mg  daily x 2 days, then 5mg  daily x 2 days and then baseline regimen - Take doxycycline 100mg  po twice daily x 5 days; take after meals and avoid sunlight (if nausea hold ofev during this time) - control acid reflux  #ILD due to RA  - continue ofev 100mg  once daily (if nausea - hold ofev while on doxy)  - increase ofev to 100mg  twice daily after completing doxy course  - stay on this dose for 1-3 months before we meet and increase it - ok for Rituxan in setting of Ofev  - covid-19 vaccine sometime in feb 2021/march 2021 is fine (pay attention go govt regulation on safety) - meet with Verline Lema 08/03/2019  - research assistant at [facility] for ILD-PRO study  Followup - check LFT in 4 weeks  - 4 weeks - either 15 min telephine vist or face to face 30 min with Dr Chase Caller in ILD clinic

## 2019-08-04 DIAGNOSIS — M8589 Other specified disorders of bone density and structure, multiple sites: Secondary | ICD-10-CM | POA: Diagnosis not present

## 2019-08-04 DIAGNOSIS — M0579 Rheumatoid arthritis with rheumatoid factor of multiple sites without organ or systems involvement: Secondary | ICD-10-CM | POA: Diagnosis not present

## 2019-08-04 DIAGNOSIS — J849 Interstitial pulmonary disease, unspecified: Secondary | ICD-10-CM | POA: Diagnosis not present

## 2019-08-11 DIAGNOSIS — I951 Orthostatic hypotension: Secondary | ICD-10-CM | POA: Diagnosis not present

## 2019-08-11 DIAGNOSIS — M069 Rheumatoid arthritis, unspecified: Secondary | ICD-10-CM | POA: Diagnosis not present

## 2019-08-11 DIAGNOSIS — S2242XD Multiple fractures of ribs, left side, subsequent encounter for fracture with routine healing: Secondary | ICD-10-CM | POA: Diagnosis not present

## 2019-08-11 DIAGNOSIS — U071 COVID-19: Secondary | ICD-10-CM | POA: Diagnosis not present

## 2019-08-11 DIAGNOSIS — J9621 Acute and chronic respiratory failure with hypoxia: Secondary | ICD-10-CM | POA: Diagnosis not present

## 2019-08-11 DIAGNOSIS — K219 Gastro-esophageal reflux disease without esophagitis: Secondary | ICD-10-CM | POA: Diagnosis not present

## 2019-08-16 ENCOUNTER — Encounter: Payer: Medicare Other | Admitting: Internal Medicine

## 2019-08-16 ENCOUNTER — Other Ambulatory Visit: Payer: Self-pay

## 2019-08-16 DIAGNOSIS — J849 Interstitial pulmonary disease, unspecified: Secondary | ICD-10-CM

## 2019-08-16 NOTE — Research (Signed)
Title: Chronic Fibrosing Interstitial Lung Disease with Progressive Phenotype Prospective Outcomes (ILD-PRO) Registry   Protocol #: IPF-PRO-SUB, Clinical Trials # [id number], Sponsor: [employer]  Protocol Version Amendment 4 dated 12Sep2019  and confirmed yes current on 08/02/2019 Consent Version for today's visit date of 08/02/2019  is Version Amendment 4 ([id number])  Objectives:  [place] Kitchen Describe current approaches to diagnosis and treatment of chronic fibrosing ILDs with progressive phenotype  . Describe the natural history of chronic fibrosing ILDs with progressive phenotype  . Assess quality of life from self-administered participant reported questionnaires for each disease group  . Describe participant interactions with the healthcare system, describe treatment practices across multiple institutions for each disease group  . Collect biological samples linked to well characterized chronic fibrosing ILDs with progressive phenotype to identify disease biomarkers  . Collect data and biological samples that will support future research studies.                                            Key Inclusion Criteria: Willing and able to provide informed consent  Age ? 30 years  Diagnosis of a non-IPF ILD of any duration, including, but not limited to Idiopathic Non-Specific Interstitial Pneumonia (INSIP), Unclassifiable Idiopathic Interstitial Pneumonias (IIPs), Interstitial Pneumonia with Autoimmune Features (IPAF), Autoimmune ILDs such as Rheumatoid Arthritis (RA-ILD) and Systemic Sclerosis (SSC-ILD), Chronic Hypersensitivity Pneumonitis (HP), Sarcoidosis or Exposure-related ILDs such as asbestosis.  Chronic fibrosing ILD defined by reticular abnormality with traction bronchiectasis with or without honeycombing confirmed by chest HRCT scan and/or lung biopsy.  Progressive phenotype as defined by fulfilling at least one of the criteria below of fibrotic changes (progression set  point) within the last 24 months regardless of treatment considered appropriate in individual ILDs:  . decline in FVC % predicted (% pred) based on >10% relative decline  . decline in FVC % pred based on ? 5 - <10% relative decline in FVC combined with worsening of respiratory symptoms as assessed by the site investigator  . decline in FVC % pred based on ? 5 - <10% relative decline in FVC combined with increasing extent of fibrotic changes on chest imaging (HRCT scan) as assessed by the site investigator  . decline in DLCO % pred based on ? 10% relative decline  . worsening of respiratory symptoms as well as increasing extent of fibrotic changes on chest imaging (HRCT scan) as assessed by the site investigator independent of FVC change.    Key Exclusion Criteria: Malignancy, treated or untreated, other than skin or early stage prostate cancer, within the past 5 years  Currently listed for lung transplantation at the time of enrollment  Currently enrolled in a clinical trial at the time of enrollment in this registry    Clinical Research Coordinator / Research RN note : This visit for Subject Stacey [name] with DOB: September 02, 1940 on 08/16/2019 for the above protocol is Visit/Encounter # Enrollment/Baseline and is for purpose of research. The consent for this encounter is under Protocol Version Amendment 4 [phone]) and is currently IRB approved. Subject expressed continued interest and consent in continuing as a study subject. Subject confirmed that there was no change in contact information (e.g. address, telephone, email). Subject thanked for participation in research and contribution to science.   During this visit on 08/16/2019, the subject met with me in the office to discuss the protocol ICF and sign. The  study coordinator went over the entire ICF with the subject and the subject was given ample time to read and review the ICF. After review of the ICF,all questions were answered to the  subject's satisfaction.Dr. Linwood Dibbles the ICF with the patient, explained the purpose of research, and asked if there were any additional questions. She stated that thestudycoordinatorand doctorhave explained the study to her thoroughly and stated she had no additional questions. The subject,study coordinator,and PI signed the ICF and the subject was given a signed copy of the ICF for her records. After consent all study related procedures were conducted as per theabovestated protocol. For additional information on today's visit,please refer to subject's paper source binder. Signed by  [place] Assistant [facility]  [place], [place] 1:51 PM 08/16/2019

## 2019-08-17 ENCOUNTER — Telehealth: Payer: Self-pay | Admitting: Internal Medicine

## 2019-08-17 DIAGNOSIS — J45909 Unspecified asthma, uncomplicated: Secondary | ICD-10-CM | POA: Diagnosis not present

## 2019-08-17 DIAGNOSIS — M069 Rheumatoid arthritis, unspecified: Secondary | ICD-10-CM | POA: Diagnosis not present

## 2019-08-17 DIAGNOSIS — F419 Anxiety disorder, unspecified: Secondary | ICD-10-CM | POA: Diagnosis not present

## 2019-08-17 DIAGNOSIS — F329 Major depressive disorder, single episode, unspecified: Secondary | ICD-10-CM | POA: Diagnosis not present

## 2019-08-17 DIAGNOSIS — S2242XD Multiple fractures of ribs, left side, subsequent encounter for fracture with routine healing: Secondary | ICD-10-CM | POA: Diagnosis not present

## 2019-08-17 DIAGNOSIS — U071 COVID-19: Secondary | ICD-10-CM | POA: Diagnosis not present

## 2019-08-17 DIAGNOSIS — I2510 Atherosclerotic heart disease of native coronary artery without angina pectoris: Secondary | ICD-10-CM | POA: Diagnosis not present

## 2019-08-17 DIAGNOSIS — I951 Orthostatic hypotension: Secondary | ICD-10-CM | POA: Diagnosis not present

## 2019-08-17 DIAGNOSIS — Z8701 Personal history of pneumonia (recurrent): Secondary | ICD-10-CM | POA: Diagnosis not present

## 2019-08-17 DIAGNOSIS — M8580 Other specified disorders of bone density and structure, unspecified site: Secondary | ICD-10-CM | POA: Diagnosis not present

## 2019-08-17 DIAGNOSIS — K219 Gastro-esophageal reflux disease without esophagitis: Secondary | ICD-10-CM | POA: Diagnosis not present

## 2019-08-17 DIAGNOSIS — J9621 Acute and chronic respiratory failure with hypoxia: Secondary | ICD-10-CM | POA: Diagnosis not present

## 2019-08-17 NOTE — Telephone Encounter (Signed)
Question from research assistant - Verline Lema about date of dx of ILD . Based on my chart reviwe first notation giving her diagnosis of RA-ILD was 01/12/2017 by Dr Melvyn Novas to patient. HEr CT showed ILD before that but typically for ILD trials - date patient informed is what is taken. So, will mark it as this above date    SIGNATURE    Dr. Brand Males, M.D., F.C.C.P, ACRP-CPI Pulmonary and [place] Investigator, [facility] [place] Staff Physician, [place] Director - Interstitial Lung Disease  Program  Pulmonary [facility] @ [place], [place], [address]  Pager: [phone], If no answer or between  15:00h - 7:00h: call [phone] Telephone: [phone]  5:07 PM 08/17/2019

## 2019-08-21 ENCOUNTER — Telehealth: Payer: Self-pay | Admitting: Internal Medicine

## 2019-08-21 NOTE — Telephone Encounter (Signed)
Per spouse Ned, pt's symptoms improved after beginning the pred taper and doxycycline but dry cough has returned.  Festus Aloe is also questioning patient's Ofec dosing - would like to ensure the next shipment will be the dose that pt is supposed to be on (has only been on Ofev x3-4 weeks and is currently tapering up the dose)  Televisit scheduled with Eustaquio Maize NP (saw Beth last on 1.7.21) to discuss symptoms and Ofev questions.  Nothing further needed; will sign off.

## 2019-08-22 ENCOUNTER — Other Ambulatory Visit: Payer: Self-pay

## 2019-08-22 ENCOUNTER — Ambulatory Visit (INDEPENDENT_AMBULATORY_CARE_PROVIDER_SITE_OTHER): Payer: Medicare Other | Admitting: Primary Care

## 2019-08-22 ENCOUNTER — Other Ambulatory Visit: Payer: Self-pay | Admitting: Primary Care

## 2019-08-22 DIAGNOSIS — J209 Acute bronchitis, unspecified: Secondary | ICD-10-CM

## 2019-08-22 DIAGNOSIS — J849 Interstitial pulmonary disease, unspecified: Secondary | ICD-10-CM | POA: Diagnosis not present

## 2019-08-22 MED ORDER — AZITHROMYCIN 250 MG PO TABS: ORAL_TABLET | ORAL | 0 refills | Status: DC

## 2019-08-22 MED ORDER — PREDNISONE 10 MG PO TABS: ORAL_TABLET | ORAL | 0 refills | Status: AC

## 2019-08-22 MED ORDER — PREDNISONE 10 MG PO TABS: 10.0000 mg | ORAL_TABLET | Freq: Every day | ORAL | 0 refills | Status: DC

## 2019-08-22 NOTE — Patient Instructions (Addendum)
Bronchitis: Prednisone taper; then stay on 10mg  daily Zpack as prescribed  ILD: Continue OFEV 100mg  twice daily If diarrhea and nausea persist or worsen please let office know   Referral:  Pulmonary rehab re: ILD   Orders: Due for labs end of February (liver function testing)  Follow-up: 3-4 weeks televisit or OV per Dr. Chase Caller   COVID-19 Vaccine Information can be found at: [email or web address] For questions related to vaccine distribution or appointments, please email [email or web address] or call [phone].

## 2019-08-22 NOTE — Progress Notes (Signed)
Virtual Visit via Telephone Note  I connected with Stacey [name] on 08/22/19 at  2:30 PM EST by telephone and verified that I am speaking with the correct person using two identifiers.  Location: Patient: Home Provider: Office   I discussed the limitations, risks, security and privacy concerns of performing an evaluation and management service by telephone and the availability of in person appointments. I also discussed with the patient that there may be a patient responsible charge related to this service. The patient expressed understanding and agreed to proceed.   History of Present Illness: 79 year old female, never smoked. PMH significant for ILD, restrictive lung disease, chronic respiratory failure with hypoxia, GERD, HTN, RA. Patient of Dr. Chase Caller. Followed by rheumatology at [facility]. Maintained on Rituxan and Dulera 200. Due for spirometry with DLCO in July 2020.    Previous [facility] pulmonary encounter: 03/08/2019 Reports increased chronic cough and wheezing over the last week. Cough is congested with very little production. She has not tried mucinex. She is not currently using her flutter valve. She has been using Dulera 100 as prescribed twice daily. Using oxygen which seems to help. She has been taking her DUONEB nebulizer twice a day for the last week. She is on chronic 4mg  medrol. Denies fever. She will be getting testing for COVID prior to PFTs on September 14th.   07/20/2019 Patient presents today for regular office visit to discuss starting OFEV. Accompanied by her husband. She has questions regarding side effects of antifibrotic medication. She developed a cough 5 days ago after being hospitalized for fall where she sustained five rib fractures. She is getting up occasional mucus which is mostly clear with some green tinge. Shortness of breath is baseline. She has noticed some increased wheezing. She tested positive for COVID-19 on 11/18 and recently was admitted for covid pneumonia  on 11/29 where she was treated with remdesivir/Decadron. CT chest showed new patchy airspace opacity in the LLL likely atelectasis/contusion and stable chronic fibrotic interstitial lung disease. No recent fevers.    08/22/2019 Productive cough with green mucus production x 10 days. Previous treated with abx and prednisone which improved her symptoms. She felt better for 1 week after completing. Associated wheezing. She is taking dulera twice daily. She has not tried using albuterol. Started Ofev 100mg  three weeks ago. Reports some mild diarrhea and nausea for two days. She has a history of constipation and has not required imodium. Husband reports very little energy and chronic fatigue for a couple of weeks. No stamina. No known sick contacts. On wait list for covid vaccine. She is on 3L oxygen    Observations/Objective:  - Able to speak in full sentences - Mild dyspnea and throat clearing. No audible wheezing  07/20/19 CXR- Mild-to-moderate severity diffuse interstitial infiltrates. While this may be, in part, infectious in etiology, the presence of superimposed interstitial edema cannot be excluded.  Assessment and Plan:  ILD: - Patient has been on anti fibrotic for three weeks, reports very mild diarrhea/nausea  - Continue OFEV 100mg  twice daily - Recommend imodium if needed for diarrhea, stay well hydrated  - If diarrhea/nausea persists or worsens consider ofev holiday for 1-2 weeks  - Stay on 10mg  prednisone daily after taper - Refer to pulmonary rehab d/t fatigue/deconditioning  - Due for LFTs end of February - On wait list for covid vaccine   Bronchitis:  - Recurrent bronchitis with productive cough and wheezing  - RX azithromycin and prednisone taper then stay on 10mg  daily  Follow Up Instructions:  - 4 weeks televisit or OV with MR  I discussed the assessment and treatment plan with the patient. The patient was provided an opportunity to ask questions and all were answered.  The patient agreed with the plan and demonstrated an understanding of the instructions.   The patient was advised to call back or seek an in-person evaluation if the symptoms worsen or if the condition fails to improve as anticipated.  I provided 30 minutes of non-face-to-face time during this encounter.   Martyn Ehrich, NP

## 2019-08-23 ENCOUNTER — Other Ambulatory Visit: Payer: Self-pay | Admitting: General Surgery

## 2019-08-23 ENCOUNTER — Encounter: Payer: Self-pay | Admitting: Primary Care

## 2019-08-23 DIAGNOSIS — J849 Interstitial pulmonary disease, unspecified: Secondary | ICD-10-CM

## 2019-08-24 ENCOUNTER — Encounter (HOSPITAL_COMMUNITY): Payer: Self-pay | Admitting: *Deleted

## 2019-08-24 ENCOUNTER — Telehealth: Payer: Self-pay | Admitting: Internal Medicine

## 2019-08-24 DIAGNOSIS — J849 Interstitial pulmonary disease, unspecified: Secondary | ICD-10-CM

## 2019-08-24 NOTE — Progress Notes (Signed)
Received referral from Geraldo Pitter NP with Dr. Chase Caller for this pt to participate in pulmonary rehab with the the diagnosis of ILD.  Notified Dr. Chase Caller nurse that we would need an updated referral signed by MD for Medicare to reimburse. Await retrun call Clinical review of pt follow up appt on 08/23/19  Pulmonary office note.  Pt with Covid Risk Score - 6 Pt appropriate for scheduling for Pulmonary rehab once updated referral is received.  Will forward to support staff for verification of insurance eligibility/benefits with pt consent. Cherre Huger, BSN Cardiac and Training and development officer

## 2019-08-24 NOTE — Telephone Encounter (Signed)
Called and spoke with Stacey [name] who stated the pulm rehab order needed to be placed under MR instead of APP. New order placed. Nothing further needed.

## 2019-08-29 ENCOUNTER — Telehealth (HOSPITAL_COMMUNITY): Payer: Self-pay

## 2019-08-29 NOTE — Telephone Encounter (Signed)
Pt insurance is active and benefits verified through Medicare a/b Co-pay 0, DED $203/$203 met, out of pocket 0/0 met, co-insurance 20%. no pre-authorization required.  2ndary insurance is active and benefits verified through [employer]. Co-pay 0, DED 0/0 met, out of pocket 0/0 met, co-insurance 0. No pre-authorization required.  

## 2019-09-12 DIAGNOSIS — T283XXD Burn of internal genitourinary organs, subsequent encounter: Secondary | ICD-10-CM | POA: Diagnosis not present

## 2019-09-12 DIAGNOSIS — N952 Postmenopausal atrophic vaginitis: Secondary | ICD-10-CM | POA: Diagnosis not present

## 2019-09-19 ENCOUNTER — Telehealth (HOSPITAL_COMMUNITY): Payer: Self-pay | Admitting: Internal Medicine

## 2019-09-20 ENCOUNTER — Other Ambulatory Visit: Payer: Self-pay

## 2019-09-20 ENCOUNTER — Encounter: Payer: Self-pay | Admitting: Internal Medicine

## 2019-09-20 ENCOUNTER — Ambulatory Visit (INDEPENDENT_AMBULATORY_CARE_PROVIDER_SITE_OTHER): Payer: Medicare Other | Admitting: Internal Medicine

## 2019-09-20 DIAGNOSIS — M359 Systemic involvement of connective tissue, unspecified: Secondary | ICD-10-CM

## 2019-09-20 DIAGNOSIS — Z5181 Encounter for therapeutic drug level monitoring: Secondary | ICD-10-CM

## 2019-09-20 DIAGNOSIS — J8489 Other specified interstitial pulmonary diseases: Secondary | ICD-10-CM | POA: Diagnosis not present

## 2019-09-20 NOTE — Patient Instructions (Addendum)
ICD-10-CM   1. 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

## 2019-09-20 NOTE — Addendum Note (Signed)
Addended by: Lorretta Harp on: 09/20/2019 12:49 PM   Modules accepted: Orders

## 2019-09-20 NOTE — Progress Notes (Signed)
Brief patient profile:  50 yowf  never smoker with allergies/inhalers as child outgrew by [employer] then  RA since around 2000  Prednisone  x decades and prev eval by Dr Joya Gaskins around 2004 for sob resolved s maint rx and referred 05/26/2013 by Dr Shelia Media for bronchitis and abn cxr   History of Present Illness  05/26/2013 1st [facility] office visit/ Wert cc June 2014 dx pna  In [place] and remicade stopped and 100% better and placed arencia in September 2014  then abruptly worse first week in November with cough green sputum s nasal symptoms, fever low grade and no cp or cough and completely recovered prior to [place] does not recall abx but issue is why keeps getting sick and abn CT Chest (see below).   Arthritis symptoms well controlled at present on Rx for RA rec Nexium 40 mg Take 30-60 min before first meal of the day and add pepcid 20 mg one at bedtime whenever coughing.     10/19/2017  f/u ov/Wert re:  RA lung dz  Chief Complaint  Patient presents with  . Follow-up    Cough is much improved, but has not resolved yet. Cough is non prod. She has not had to use her neb.   Dyspnea:  Not limited by breathing from desired activities  But some doe x steps Cough: daytime > noct dry  Sleep: fine  SABA use:  No saba Medrol 4 mg a/w 8m per day/ ok control of arthritis  rec Start back on gabapentin up to 300 mg each am  in addition to the the two at bedtime  If not better increase the medrol to 8 mg daily until bettter then taper back to where you      01/10/2018  f/u ov/Wert re:  RA  Lung dz Medrol  4 mg  One alternating with a half Chief Complaint  Patient presents with  . Follow-up    PFT's done. Her breathing has been gradually worsening since the last visit. She has occ cough- non prod.   Dyspnea gradually worse since last ov:  MMRC1 =  MMRC3 = can't walk 100 yards even at a slow pace at a flat grade s stopping due to sob    Gradually x 3 m / more fatigue / no change in  arthritis  Cough: not an issue rec Protonix 40 mg Take 30-60 min before first meal of the day  GERD diet   01/17/2018 acute extended ov/Wert re: cough on medrol 4 mg  One a/w one half  Chief Complaint  Patient presents with  . Acute Visit    started coughing 01/11/18- occ prod with minimal green sputum.  She states also wheezing and having increased SOB.    abruptly worse 01/11/18 with severe 24/7 coughing >>  prod min green mucus esp in am/ assoc with subjective wheeze and did not follow previous contingencies re flutter / saba/ increase medrol and admits she does not rember those written instructions nor how to use the neb provided .  No fever/ comfortable at rest sitting  rec For cough > mucinex dm 1200 mg every 12 hours and cough into the flutter valve as much as possible  Doxycycline 100 mg twice daily x 10 days with glass of water Medrol 432mx 2 now and take 2  daily until cough is better then 1 daily x 5 days and then resume the previous dose  Shortness of breath/ wheezing/ still coughing >  albuterol neb every 4 hours as needed      01/20/2018 acute extended ov/Wert re: refractory cough and sob 01/11/18 Chief Complaint  Patient presents with  . Acute Visit    she is not feeling better, coughing , very SOB, wheezing  mucus now clear/scant  on doxy/ neb machine not working (tube would not plug into the side s adequate force and she was not capable of applying it due to RA hands. Cough/ wheeze/ sob 24/7 / flutter not helping/ can't lie down at hs   rec While coughing protonix 40 mg Take 30- 60 min before your first and last meals of the day  Shortness of breath/ wheezing/ still coughing > albuterol neb every 4 hours as needed  Depomedrol 120 mg IM and medrol 32 mg daily x 2 days,  then 16 mg x 3 days,  Then 8 mg x 4 days , then resume the 4 mg daily  For severe cough > tylenol 3# one every 4 hours if needed  Go to ER if condition worsens on above plan       Date of admission:  01/22/2018             Date of discharge: 01/27/2018   History of present illness: As per the H and P dictated on admission, "[name] a79 y.o.female,w Rheumatoid arthritis, ILD Asthma, apparently c/o increase in dyspnea this evening. Dry cough. Denies fever, chills, cp, palp, N/v, diarrhea, brbpr, black stool. Pt notes recently being given steroid injection in office as well as being placed on doxycycline. This might have helped slightly but pt worse Hospital Course:  Summary of her active problems in the hospital is as following. 1 dyspnea/hypoxemia/ILD Concerned that likely GERD Is causing ILD. Patient with cough.  Patient with complaints of awakening with cough and also with oral intake which is slightly improved since 01/24/2018. assessed by speech therapy and speech therapy raising concern of esophageal component but no signs of aspiration.  2D echo with a EF of 55 to 60% with no wall motion abnormalities, grade 1 diastolic dysfunction.  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 Pulmonary felt that the patient should be on scheduled Reglan. I have placed the patient on scheduled potassium before sleep. Continue steroids on discharge continue Mucinex and Claritin as well as inhalers. Patient will follow-up with pulmonary outpatient  2. Gastroesophageal reflux disease Continue PPI and H2 blocker.I changed PPI to [facility].  3. Rheumatoid arthritis Outpatient follow-up.   4. Anxiety Continue Effexor.    All other chronic medical condition were stable during the hospitalization.  Patient was ambulatory without any assistance. On the day of the discharge the patient's vitals were stable , and no other acute medical condition were reported by patient. the patient was felt safe to be discharge at home with family.  Consultants: PCCM  Procedures: Echocardiogram      03/21/2018  f/u ov/Wert re:   S/p admit was  transiently better  and downhill since Labor day on medrol 4 mg daily  Last orencia on Sept 4th 2019  Chief Complaint  Patient presents with  . Acute Visit    Per patient, she has had a dry cough since July 2019. She has been wheezing as well. Increased fatigue. Body aches. Denies any fever or chills.   Dyspnea:  MMRC4  = sob if tries to leave home or while getting dressed   Cough: harsh/ hacking mostly dry/ has flutter not using    SABA  use: not much better with rx   No obvious day to day or daytime variability or assoc excess/ purulent sputum or mucus plugs or hemoptysis or cp or chest tightness, subjective wheeze or overt sinus or hb symptoms.     Also denies any obvious fluctuation of symptoms with weather or environmental changes or other aggravating or alleviating factors except as outlined above   No unusual exposure hx or h/o childhood pna/ asthma or knowledge of premature birth.   INpatient consult 03/26/18 79 year old with rheumatoid arthoritis.At baseline the patient lives at home with her husband and is independent of ADLs.  Has been on many immune suppressants over decades and curently on orencia x 4 year and prednisone. Does not recollect being on bactrim/dapsone. Chart mentions BOOP/MAI in 2001 but she denies this. 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 Got admitted. Rx with steroids. Per Notes - clnical suspicion of  arytenoid inflmmation related wheeze noticed (she also reports asthma NOS). Follolwup with ENT recommended (but not seen one as yet). She also appears to have passed swallow with rec for regular diet with thin liquids but did to have mild eso stricture and reflux during testing . PFT shows 10% FVC decline for first ime. CT chest at this tme (aug 2019) showed new rLL infiltrate. ECHO July 2019 without evicence of  elevated PASP and saw cards [facility] June 2019 and was considered to have worsenin dyspnea due to [facility] issues (reports stress test at [facility] that was normal but I cannot see it)  She reports after discharge she got better but in last several weeks has deteriorated with cough and dyspnea. There is new hypoxemia (currently RA with nail polish and poor circulation - 89% pulse ox) needing 2L [place]. Per [facility] improved with steroids and abx. CTA 03/24/2018 => shows that RLL inifltrate has improved . Other chronic ILD changes + and small  Hiatal hernia + witthout change.  Review of lab work does show eosinophilia at time of admision  EVENTS 03/21/18 - IgE -5, blood allegy panel - negative, 03/23/2018 - - admit . HIGH EOS 2300, ESR 48, BNP 89 , HIV neg 9/12- PCT negative, RVP negative 9/14 -  PCT < 0.1, Urine strep - negative, MRSA PCR - positive. IgE - normal 4, Blood allergy panel repeat - negative 03/26/2018 - leading consideration for airway (BO in RA +/- asthma) related flare either due to MRSA bronchitis or clinical suspicion of arytenoid inflmmation +/- GERD relatd flare (she has small hiatal hernia)  +/- ? Dysphagia  up causing mild hypoxemia acute resp failure, wheeze . Allergy and IgE blood work negative thought. Patient reports being better but says she is choking on drinkin water [facility] MD says wheeze improved significantly with steroids.  RN says was down to RA yesterday evening but needed 1L [place] at sleep. Today -Room air at rest 94% and desaturated to 86% walking 60 feet 03/27/2018  - better. Off o2 at rest. STill coughs with water and when lies down.  Husband at bedside. Both requesting ILD clinic followup . Desaturated t 79% walking 90 feet.   OV 04/12/2018  Subjective:  Patient ID: Stacey [name], female , DOB: 1941-02-17 , age 87 y.o. , MRN: [id number] , ADDRESS: [place] [address]   04/12/2018 -   Chief  Complaint  Patient presents with  . Consult    Pt is a former  [facility] pt.  Pt denies any current complaints of cough, SOB, or CP but states the cough she originally had ended her up in the hosp 9/11-9/17 with dx acute respiratory failure. Pt does wear 2pulse with exertion and also wears 2L continuous when at home.     HPI JAHNAI SLINGERLAND 79 y.o. -presents for follow-up to the ILD clinic.  She is known to have rheumatoid arthritis with ILD changes.  She had been followed by Dr. Christinia Gully.  However in July 2019 in September 2019 she has had 2 admissions to the hospital with respiratory distress and hypoxemic respiratory failure.  In the first 1 that seem to be right lower lobe infiltrate and then she improved from it but in the second 1 even though the right lower lobe infiltrates were better she still was hypoxemic.  Acid reflux and dysphagia was considered a possible etiology but she passed swallow study 2 times.  They thought she had some reflux.  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  At the same time it is not clear if the ILD is progressive based on pulmonary function testing below   At this point in time she tells me that she is getting home physical therapy.  Her fatigue is improving but it is not fully resolved.  She was discharged on continuous oxygen which she is using.  However she is feeling less short of breath.  Today in fact when we turned her oxygen off and walked her she did not desaturate and this is a significant improvement.  She is on monthly Orencia through the [facility] rheumatologist Dr. Eda Paschal.  At this point in time she is put the Orencia on hold.  She told me that she is been on Orencia for 4 years and never had a respiratory exacerbation still recently x 2.  Although before going on Orencia she had pneumonia while on Remicade and the Remicade.  In terms of her rheumatoid arthritis she hardly has any pain.  Her joint architecture is fairly well-preserved because of various  immunomodulators over time.  She says that she was on Remicade for years and when she stopped it for 8 weeks before the switch to [place] she never really had a relapse in her rheumatoid arthritis.  She is largely pain and stiffness free.  She believes she can go without  her Orencia for a while.  Review of the literature shows greater than 10% chance of a respiratory infection especially COPD exacerbation.  Although the time frame for this is unclear.       OV 06/07/2018  Subjective:  Patient ID: Stacey [name], female , DOB: 1941/06/06 , age 5 y.o. , MRN: [id number] , ADDRESS: [place] [address]   06/07/2018 -   Chief Complaint  Patient presents with  . Follow-up    ILD, PFT done today, some wheezing and coughing but better tha before   Rheumatoid arthritis ILD and asthma/obstructive lung disease phenotype on Dulera  HPI ELLENA KAMEN 79 y.o. -presents for routine follow-up with her husband.  She is here to follow-up with [facility] Dr. Stann Mainland.  She plans to do this in December 2019.  She continues to be off Orencia.  Her joints are slowly getting stiff again.  She believes that she will need to be back on immunosuppression agent again.  She currently  continues Medrol 4 mg alternating with 2 mg.  This for her rheumatoid arthritis.  In terms of her joints she continues on Medrol 4 mg alternating with 2 mg but not on any other immunosuppression agent.  Overall she is been stable but for the last 2 weeks has had green sputum and wheezing and chest congestion and cough.  She recently visited her husband who was hospitalized and walking the long hallways at [facility] made a short of breath but she thinks this is probably baseline for her.  There are no other new issues.  She did have spirometry and DLCO and this shows a decline compared to September 2019 and a similar to July 2019.  It is documented below.  This is probably reflective of a  flareup   OV 07/21/2018  Subjective:  Patient ID: Stacey [name], female , DOB: 1941/05/03 , age 39 y.o. , MRN: [id number] , ADDRESS: [place] [address]   07/21/2018 -   Chief Complaint  Patient presents with  . Follow-up    Pt states she has been doing well since last visit. States she is about to begin Rituxan with [facility]. Pt still becomes SOB with exertion. Denies any complaints of cough or CP.   Rheumatoid arthritis ILD and asthma/obstructive lung disease phenotype on Dulera  HPI JASIRA ROBINSON 79 y.o. -presents for follow-up of the above.  Last seen just before Thanksgiving 2019.  In the interim overall stable although on June 23, 2018 she climbs a steep flight of stairs which is unusual exertion for her and she became very dyspneic.  Following day saw Dr. Stann Mainland at [facility] rheumatology and was given Z-Pak and prednisone and started feeling better.  Although it is not fully clear to me she had fever and bronchitic symptoms.  I reviewed Dr. Stann Mainland note.  Dr. Stann Mainland is decided to start Rituxan for rheumatoid arthritis.  She is only having some minimal joint pain at this point.  She is off Orencia and continues to be off [place].  She did have some blood work with [place] before starting Rituxan.  She is due to see Dr. Stann Mainland within the next week and start her Rituxan.  Her liver function test July 18, 2017 is normal hemoglobin is normal.  CRP is also normal.  We did spirometry and walking desaturation test.  These show improvement compared to before and these are documented below.  Currently she not using nighttime or daytime oxygen.  She is wondering if she could switch rheumatology care to [facility].  This is because while she likes [employer] she is getting older and more frail and feels some body local would be of help.  I have sent a message to Dr. Estanislado Pandy inquiring.  Certainly we can help her with Rituxan infusions at [facility]  if needed.  She will check on this with her [facility] rheumatologist.       OV 03/27/2019  Subjective:  Patient ID: Stacey [name], female , DOB: 12/31/1940 , age 3 y.o. , MRN: [id number] , ADDRESS: [place] [address]  Rheumatoid arthritis ILD and asthma/obstructive lung disease phenotype on [facility]   03/27/2019 -  Rourine fu   HPI TENELLE ANDREASON 79 y.o. -presents for the above.  Last seen in January 2020.  After that she has seen Dr. Stann Mainland rheumatology at [facility].  She is now getting Rituxan 2 doses every 6 months.  She says this  is helped her joints and her stiffness.  She is a little bit more mobile than usual.  However in terms of her respiratory status she continues to have episodic cough.  In June 2020 she again got hypoxemic and got admitted.  Since then she has had episodic cough.  She had a respiratory exacerbation in June 2020 for the admission she got steroids.  This seemed to help.  She is also on a higher dose Dulera right now.  In terms of her cough this seems to be her biggest problem.  She seems to be on Dulera, Singulair scheduled with also Tessalon and Delsym and DuoNeb.  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  Her last high-resolution CT scan of the chest was 1 year ago.  She says the dyspnea itself is not worse.  She is currently not using oxygen.  On exam she did have some wheezing.  Currently she has white and brown sputum but this is baseline.  In terms of a COVID wrist she has been tested recently couple of times and this is been negative.  She is isolating well.  She wanted to know COVID prevention activities and risk status and masking strategies.      OV 05/24/2019  Subjective:  Patient ID: Stacey [name], female , DOB: May 20, 1941 , age 86 y.o. , MRN: [id number] , ADDRESS: [place]  [id number]   05/24/2019 -   Chief Complaint  Patient presents with  . Follow-up    Pt was recently in the hosp due to ILD. Pt states that she has been better since being out of the hosp.   Rheumatoid arthritis ILD and asthma/obstructive lung disease phenotype on Dulera/Singulair  Post herpetic neuralgia - on elavil, gabapentin, effexor  HPI HELLEN SHANLEY 79 y.o. -returns for follow-up.  At the last visit approximately 2 months ago she was reporting worsening cough following an admission in summer 2020.  Her pulmonary function test suggested worsening ILD status.  Therefore we requested a high-resolution CT chest which she did in October 2020.  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  However in the interim after the CT scan was done towards the end of October 2020 she developed worsening of her cough over 2 weeks and also associated shortness of breath but significantly the cough is much worse.  She ended up getting admitted to the hospital.  There was some hypoxemia.  By this time she had finished a ENT evaluation that did not show any involvement of the arytenoids.  Pulmonary was consulted.  She was given a prednisone burst which she just finished I believe yesterday.  She is back on her baseline Medrol.  And she is feeling better.  Her oxygen status is improved although she is using oxygen at night now.  She is really frustrated with these recurrent flareups and these admissions which ended up with her having a wheeze and also hypoxemia.  Currently she is on her baseline Medrol for rheumatoid arthritis associated with Dulera and Singulair.  She is also on losartan for blood pressure.  Her walking desaturation test is slightly worse than baseline.  She has a GI consult pending because of the recurrent episodes of cough and flareups.  We went over exposure history.  We used interstitial lung disease questionnaire for the exposure history.  Specifically she denies any  electronic cigarette use of marijuana use of cocaine use or any IV drug abuse.  [place] last 14 years.  Asked extensive questions about the home environment it is positive for nebulizer use but the nebulizer does not have mildew or mold in it.  Otherwise no organic antigen exposure.  The house is not damp.  There is no mold or mildew in the shower curtain.  There is no humidifier use no steam iron use.  No Jacuzzi use.  No misting [place] outside to inside the house.  No pet birds.  No pet gerbils no feather pillows.  There is no mold in the [facility] duct.  She does not do any gardening.  Does not use wind instruments.  Also 122 question occupational history elicited and essentially negative.  The other issue is that she has polypharmacy.  She is asking for my help in reducing her medications.  She is on 3 medications for postherpetic neuralgia.  She is on losartan      OV 06/21/2019  Subjective:  Patient ID: Stacey [name], female , DOB: 15-Jul-1940 , age 41 y.o. , MRN: [id number] , ADDRESS: [place] [address]   06/21/2019 -   Chief Complaint  Patient presents with  . televisit    hosp 11/29-12/4 due to covid with pna. pt said that she is doing okay after recent hosp but states she has no energy.      MATSUE STROM 79 y.o. - last visit 05/24/2019. Diagnosed with covid-19 on 05/31/2019. Admitted 06/11/2019  - 06/16/2019. Quarantine ends 06/21/2019 today. Rx with Remdesviri and Decadron. Husband also on phone. Questions  1. Quarantine ends - from 06/22/19\ and she is not contagious and likely resistant to reinfection to covid for another few months  2. She is on 10 day dexamethasone for covid - today is last day for it  3. She is on dulera. Hospital gave combivent and she does not want do combivent - this is fine  4. Ofev for ILD - not started it yet . She had questions about side effects. Explained GI and  LFT monirtong. SHe wanted to wait till Jan 2021 and start it . I am ok with that.   5. Small hiat   IMPRESSION: HRCT OCt 2020 1. 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 Findings have progressed since 2017 and 2019 high-resolution chest CT studies. 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 Findings are consistent with UIP per consensus guidelines: Diagnosis of Idiopathic Pulmonary Fibrosis: An Official ATS/ERS/JRS/ALAT Clinical Practice Guideline. Page, Iss 5, [phone], Mar 13 2017. 2. 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 4. Small hiatal hernia.  Aortic Atherosclerosis (ICD10-I70.0).   Electronically Signed   By: Ilona Sorrel M.D.   On: 04/24/2019 13:29   OV 08/03/2019 - face to face visit  Subjective:  Patient ID: Stacey [name], female , DOB: March 25, 1941 , age 17 y.o. , MRN: [id number] , ADDRESS: [place] [id number]    Rheumatoid arthritis with Joint s/ILD- Rituxan/steroids ([facility])  - progressive phenotype (CT 2017/2019 -> Oct 2020). Planned Ofev start in Nov 2020 but did not start as of dec 2020. LFT normal Jan 2021  Athma/obstructive lung disease phenotype on Engineer, production  herpetic neuralgia - on elavil, gabapentin, effexor  Coronary Artery Calcification - 1 vessel - cardiology referral done  Diagnosed with covid-19 on 05/31/2019. Admitted 06/11/2019  - 06/16/2019. Rx with Remdesviri and Decadron.  Small Hiatal hernia onCT Oct 2020  Rib fracture from fall Dec 2020: Musculoskeletal: There is nondisplaced fractures of the posterior left fifth, 6, 8, 9, and tenth left ribs. There is unchanged slight superior compression deformities of the T5, T6, T10 vertebral bodies with less than 25% loss in height.Associated  pulmnary contusion +    HPI OANH DEVIVO 79 y.o. -presents for a visit. Her nintedanib has been delayed because of COVID-19. Also subsequently a fall. She saw a nurse practitioner on July 20, 2019 and they took a shared decision to start nintedanib. She had a ? telephone visit with Dr. Candie Mile the rheumatologist at [facility]. Patient scheduled for her next Rituxan in February 2021 but reviewed the notes indicates this could be pushed to early spring 2021. Dr. Stann Mainland is okay with the patient study got intubated at this point in time.  Patient has follow-up with Dr. Stann Mainland tomorrow at [facility].  At this face-to-face visit she is here with her husband.  She says she is much better after the Covid and also the fall that fractured her ribs.  Nevertheless all this is left her fatigue.  Infective fatigue scores are much worse.  Also in the last 2 weeks has had increased cough wheezing and shortness of breath.  The sputum was also changed color in the last 1 week to clear green.  This is all new.  Her symptom score is therefore a worse than her baseline.  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  She is only taking 100 mg once a day.  The plan was to go up to 100 mg twice a day next week.  This is a minimum effective dose.  The maximum dose is 150 mg twice a day.  Given her age and comorbidities we are starting at a low dose.  Part of this visit is to make sure that she is tolerating the drug fine.  And so far she is.  She is interested in the ILD-pro registry study done by the [facility].   She is working with physical therapy for her fatigue.  Ambulatory Walk 07/20/2019 2 Lap- O2 92% RA; HR 109 No shortness of breath She did not need to stop Used walker    OV 09/20/2019  Subjective:  Patient ID: Stacey [name], female , DOB: [birth year] , age 75 y.o. , MRN: [id number] , ADDRESS: [place] [address]   09/20/2019 -   Chief Complaint  Patient presents with  . Televisit    Called and spoke with pt who stated she has been feeling okay since last visit. Pt stated she is still taking OFEV and denies any complaints. Pt states she believes her breathing is stable at this point.    Rheumatoid arthritis with Joint s/ILD- Rituxan/steroids ([facility])  - progressive phenotype (CT 2017/2019 -> Oct 2020). Planned Ofev start in Nov 2020 but did not start as of dec 2020. 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 Admitted 06/11/2019  - 06/16/2019. Rx with Remdesviri and Decadron.  Small Hiatal hernia on CT Oct 2020  Rib fracture from fall Dec 2020: Musculoskeletal: There is nondisplaced fractures of the posterior left fifth, 6, 8, 9, and tenth left ribs. There is unchanged slight superior compression deformities of the T5, T6, T10 vertebral bodies with less than 25% loss in height.Associated pulmnary contusion +   HPI LIL LEPAGE 79 y.o. - has 2nd covid vaccine later this week. Rituxan is end of the month. Baseline RA regiment is On dulera for associated obstructio of lung. 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 No side effects. Did see Derl Barrow for face to face visit early feb 2021 -> for bronchitis and felt better after prdnisone and zpak. Currently on medrol   8 mg day and staying there per Derl Barrow . Wants t oknow if she can taper. Wants to know how she can prevent care flare ups. Explained ofev, masking and social  distancing prevent respiratory infection.Denies choking on food or aspirating. Denies mold in house - relatively news. Denies dog. Denies cat. Denies carious teeth. Denies post nasal drip  OVerll feels better and improved dyspnea.     SYMPTOM SCALE - ILD 03/27/2019  05/24/2019  08/03/2019 Post covid Post fall rib #  O2 use ra    Shortness of Breath 0 -> 5 scale with 5 being worst (score 6 If unable to do)    At rest 0 2 3  Simple tasks - showers, clothes change, eating, shaving 0 3 2  Household (dishes, doing bed, laundry) '2 4 5  '$ Shopping '2 3 6  '$ Walking level at own pace '2 3 4  '$ Walking keeping up with others of same age '3 4 4  '$ Walking up Stairs '3 3 5  '$ Walking up Hill '3 4 6  '$ Total (40 - 48) Dyspnea Score 15 26   How bad is your cough? '2 2 3  '$ How bad is your fatigue 3 3.5 5    Results for DALLANA, MAVITY (MRN [id number]) as of 03/27/2019 09:43  Ref. Range 07/17/2013 09:53 02/07/2016 13:09 01/11/2017 11:05 01/10/2018 10:13 05/17/2018 14:24 06/07/2018 10:57 07/21/2018 10:53 03/27/2019 09:02  FVC-Pre Latest Units: L 2.13 2.16 2.14 1.93 1.99 1.81 2.18 1.79  FVC-%Pred-Pre Latest Units: % 68 71 71 65 75 68 77 64   Results for WANNETTA, LANGLAND (MRN [id number]) as of 03/27/2019 09:43  Ref. Range 07/17/2013 09:53 02/07/2016 13:09 01/11/2017 11:05 01/10/2018 10:13 05/17/2018 14:24 06/07/2018 10:57 07/21/2018 10:53 03/27/2019 09:02  DLCO unc Latest Units: ml/min/mmHg 18.85 18.25 16.50 16.09 16.58 14.06  15.04  DLCO unc % pred Latest Units: % 69 67 61 59 70 59  77    Simple office walk  feet x  3 laps goal with forehead probe 04/12/2018  07/21/2018  05/24/2019   O2 used Room air - off o2 x 10 min Room air  Room air  Number laps completed 3 x 185 feet 3 x 250 feet   Comments about pace normal normal   Resting Pulse Ox/HR 99% and 72/min 97% and 74/min 97% and 81/min  Final Pulse Ox/HR 98% and 95/min 96% and 96/min 94% and 107/min  Desaturated </= 88% no no no  Desaturated <=  3% points no no Yes, 3 pints  Got  Tachycardic >/= 90/min yes yes yes  Symptoms at end of test No complaint Mild dyspnea Mod dyspnea  Miscellaneous comments improed from hospital Same v improved woprse     Results for BRENISHA, TSUI (MRN [id number]) as of 09/20/2019 09:06  Ref. 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   reports that she has never smoked. She has never used smokeless tobacco.  Past Surgical History:  Procedure Laterality Date  . CHOLECYSTECTOMY  2011  . WISDOM TOOTH EXTRACTION      Allergies  Allergen Reactions  . Penicillins Other (See Comments)    Reaction unknown occurred during childhood Has patient had a PCN reaction causing immediate rash, facial/tongue/throat swelling, SOB or lightheadedness with hypotension: Unknown Has patient had a PCN reaction causing severe rash involving mucus membranes or skin necrosis: Unknown Has patient had a PCN reaction that required hospitalization: No Has patient had a PCN reaction occurring within the last 10 years: No If all of the above answers are "NO", then may proceed with Cephalosporin use.  Unsur  . Remicade [Infliximab] Other (See Comments)    Reaction unknown  . Sulfa Antibiotics Other (See Comments)    Reaction unknown  . Sulfamethoxazole Other (See Comments)    Unsure of reaction As a child not sure     Immunization History  Administered Date(s) Administered  . Influenza Split 04/12/2013, 04/12/2014, 03/14/2015, 04/23/2016  . Influenza, High Dose Seasonal PF 04/12/2017, 03/24/2018, 04/13/2019  .  Influenza-Unspecified 04/03/2013, 04/22/2017  . Moderna SARS-COVID-2 Vaccination 08/25/2019  . Pneumococcal Conjugate-13 04/03/2013, 09/28/2014  . Pneumococcal Polysaccharide-23 07/13/2012, 07/24/2013  . Td 01/11/2018  . Tdap 12/10/2017    Family History  Problem Relation Age of Onset  . Asthma Mother   . Anemia Mother   . Polymyalgia rheumatica Mother   . COPD Father   . Pulmonary fibrosis Father   . Breast cancer Maternal Grandmother 63     Current Outpatient Medications:  .  acetaminophen (TYLENOL) 325 MG tablet, Take 2 tablets (650 mg total) by mouth every 6 (six) hours as needed for mild pain or headache (fever >/= 101)., Disp:  , Rfl:  .  albuterol (VENTOLIN HFA) 108 (90 Base) MCG/ACT inhaler, Inhale 2 puffs into the lungs every 6 (six) hours as needed for wheezing or shortness of breath., Disp: 18 g, Rfl: 0 .  ALPRAZolam (XANAX) 0.25 MG tablet, Take 1 tablet (0.25 mg total) by mouth 3 (three) times daily as needed for anxiety., Disp: 20 tablet, Rfl: 0 .  amitriptyline (ELAVIL) 25 MG tablet, Take 25 mg by mouth at bedtime., Disp: , Rfl: 0 .  benzonatate (TESSALON) 200 MG capsule, Take 1 capsule (200 mg total) by mouth 3 (three) times daily as needed for cough., Disp: 20 capsule, Rfl: 2 .  beta carotene w/minerals (OCUVITE) tablet, Take 1 tablet by mouth daily., Disp: , Rfl:  .  cholecalciferol (VITAMIN D) 1000 UNITS tablet, Take 1,000 Units by mouth daily., Disp: , Rfl:  .  gabapentin (NEURONTIN) 300 MG capsule, Take 600  mg by mouth at bedtime., Disp: , Rfl:  .  methylPREDNISolone (MEDROL) 4 MG tablet, Take 1 tablet (4 mg total) by mouth every other day. Alternate with the '2mg'$  dose, Disp:  , Rfl:  .  mometasone-formoterol (DULERA) 100-5 MCG/ACT AERO, Inhale 2 puffs into the lungs 2 (two) times daily., Disp: 13 g, Rfl: 0 .  Nintedanib (OFEV) 100 MG CAPS, Take 1 capsule (100 mg total) by mouth 2 (two) times daily., Disp: 180 capsule, Rfl: 3 .  pantoprazole (PROTONIX) 40 MG tablet,  Take 1 tablet (40 mg total) by mouth 2 (two) times daily before a meal., Disp: 180 tablet, Rfl: 3 .  polyethylene glycol (MIRALAX / GLYCOLAX) 17 g packet, Take 17 g by mouth daily as needed for mild constipation., Disp: , Rfl:  .  pravastatin (PRAVACHOL) 20 MG tablet, Take 1 tablet (20 mg total) by mouth every evening., Disp: 90 tablet, Rfl: 3 .  riTUXimab (RITUXAN) 100 MG/10ML injection, Inject into the vein. Receives at [facility] but unsure of dose - no more doses for next 6 months as of 09/14/18, Disp: , Rfl:  .  venlafaxine XR (EFFEXOR-XR) 75 MG 24 hr capsule, Take 75 mg by mouth at bedtime. , Disp: , Rfl:  .  dextromethorphan (DELSYM) 30 MG/5ML liquid, Take 2.5 mLs (15 mg total) by mouth at bedtime. (Patient not taking: Reported on 09/20/2019), Disp: 89 mL, Rfl: 0 .  guaiFENesin (MUCINEX) 600 MG 12 hr tablet, Take 1 tablet (600 mg total) by mouth 2 (two) times daily. (Patient not taking: Reported on 09/20/2019), Disp: 30 tablet, Rfl: 1 .  ipratropium-albuterol (DUONEB) 0.5-2.5 (3) MG/3ML SOLN, Take 3 mLs by nebulization 3 (three) times daily. (Patient not taking: Reported on 09/20/2019), Disp: 360 mL, Rfl: 0      Objective:   There were no vitals filed for this visit.  Estimated body mass index is 22.11 kg/m as calculated from the following:   Height as of 08/03/19: '5\' 6"'$  (1.676 m).   Weight as of 08/03/19: 137 lb (62.1 kg).  '@WEIGHTCHANGE'$ @  There were no vitals filed for this visit.   Physical Exam Sounded normal         Assessment:       ICD-10-CM   1. Interstitial lung disease due to connective tissue disease ([place])  J84.89    M35.9   2. Encounter for therapeutic drug monitoring  Z51.81        Plan:     Patient Instructions     ICD-10-CM   1. Interstitial lung disease due to connective tissue disease ([place])  J84.89    M35.9   2. Encounter for therapeutic drug monitoring  Z51.81     #asthma  -- improved control but agree you are having lot of flare ups - beyond baseline  decreased immunity and lung function other preciptants not known - ok to stay on medrol '8mg'$  per day through march 2021 - then in April 2021 go down to '4mg'$  per day  - then in may 2021 go down to baseline '4mg'$  alternating with '2mg'$  per day - hold off daliresp for now (more indicated in copd) give GI side effects - continue acid reflux control + masking + spcial distancing  #ILD due to RA  - continue ofev '100mg'$  once daily (if nausea - hold ofev while on doxy)  - check LFT 09/20/2019 and again in 4 weeks  - assess increase to '150mg'$  twice daily at followup in 8 weeks  -- ok for Rituxan  in setting of Ofev = prednisone for RA as above  - ok for covid 2nd vaccine later this week  Followup - check LFT 09/20/2019 and again 4 weeks - spirometry and dlco in 8 weeks  -30 min visit in 8 weeks  with Dr Chase Caller in ILD clinic    ( Level 03: Esbt 20-29 min visit spent in visit type: telephone visit in total care time and counseling or/and coordination of care by this undersigned MD - Dr Brand Males. This includes one or more of the following all delivered on this same day 09/20/2019: pre-charting, chart review, note writing, documentation discussion of test results, diagnostic or treatment recommendations, prognosis, risks and benefits of management options, instructions, education, compliance or risk-factor reduction. It excludes time spent by the [place] or office staff in the care of the patient. Actual time was 20 min)    SIGNATURE    Dr. Brand Males, M.D., F.C.C.P,  Pulmonary and Critical Care Medicine Staff Physician, [place] Director - Interstitial Lung Disease  Program  P[facility] [place], [place], [address]  Pager: [phone], If no answer or between  15:00h - 7:00h: call [phone] Telephone: [phone]  9:19 AM 09/20/2019

## 2019-09-20 NOTE — Telephone Encounter (Signed)
Unable to reach pt after multiple attempts for pulmonary rehab. Closed referral

## 2019-09-21 ENCOUNTER — Other Ambulatory Visit (INDEPENDENT_AMBULATORY_CARE_PROVIDER_SITE_OTHER): Payer: Medicare Other

## 2019-09-21 DIAGNOSIS — Z5181 Encounter for therapeutic drug level monitoring: Secondary | ICD-10-CM | POA: Diagnosis not present

## 2019-09-21 DIAGNOSIS — M359 Systemic involvement of connective tissue, unspecified: Secondary | ICD-10-CM | POA: Diagnosis not present

## 2019-09-21 DIAGNOSIS — J8489 Other specified interstitial pulmonary diseases: Secondary | ICD-10-CM | POA: Diagnosis not present

## 2019-09-21 LAB — HEPATIC FUNCTION PANEL
ALT: 15 U/L (ref 0–35)
AST: 20 U/L (ref 0–37)
Albumin: 4 g/dL (ref 3.5–5.2)
Alkaline Phosphatase: 48 U/L (ref 39–117)
Bilirubin, Direct: 0 mg/dL (ref 0.0–0.3)
Total Bilirubin: 0.4 mg/dL (ref 0.2–1.2)
Total Protein: 6.8 g/dL (ref 6.0–8.3)

## 2019-09-21 NOTE — Progress Notes (Signed)
Lab             09/21/19                      1107         AST          20           ALT          15           ALKPHOS      48           BILITOT      0.4          PROT         6.8          ALBUMIN      4.0           LFT normal on ofev

## 2019-09-27 DIAGNOSIS — Z1239 Encounter for other screening for malignant neoplasm of breast: Secondary | ICD-10-CM | POA: Diagnosis not present

## 2019-09-27 DIAGNOSIS — Z124 Encounter for screening for malignant neoplasm of cervix: Secondary | ICD-10-CM | POA: Diagnosis not present

## 2019-09-27 DIAGNOSIS — N952 Postmenopausal atrophic vaginitis: Secondary | ICD-10-CM | POA: Diagnosis not present

## 2019-09-27 DIAGNOSIS — Z6823 Body mass index (BMI) 23.0-23.9, adult: Secondary | ICD-10-CM | POA: Diagnosis not present

## 2019-09-27 DIAGNOSIS — M8580 Other specified disorders of bone density and structure, unspecified site: Secondary | ICD-10-CM | POA: Diagnosis not present

## 2019-09-27 DIAGNOSIS — Z1231 Encounter for screening mammogram for malignant neoplasm of breast: Secondary | ICD-10-CM | POA: Diagnosis not present

## 2019-09-27 DIAGNOSIS — Z01419 Encounter for gynecological examination (general) (routine) without abnormal findings: Secondary | ICD-10-CM | POA: Diagnosis not present

## 2019-10-05 DIAGNOSIS — R309 Painful micturition, unspecified: Secondary | ICD-10-CM | POA: Diagnosis not present

## 2019-10-06 DIAGNOSIS — R110 Nausea: Secondary | ICD-10-CM | POA: Diagnosis not present

## 2019-10-06 DIAGNOSIS — M810 Age-related osteoporosis without current pathological fracture: Secondary | ICD-10-CM | POA: Diagnosis not present

## 2019-10-06 DIAGNOSIS — M0579 Rheumatoid arthritis with rheumatoid factor of multiple sites without organ or systems involvement: Secondary | ICD-10-CM | POA: Diagnosis not present

## 2019-10-26 DIAGNOSIS — Z87440 Personal history of urinary (tract) infections: Secondary | ICD-10-CM | POA: Diagnosis not present

## 2019-10-26 DIAGNOSIS — N952 Postmenopausal atrophic vaginitis: Secondary | ICD-10-CM | POA: Diagnosis not present

## 2019-10-30 DIAGNOSIS — D692 Other nonthrombocytopenic purpura: Secondary | ICD-10-CM | POA: Diagnosis not present

## 2019-10-30 DIAGNOSIS — D225 Melanocytic nevi of trunk: Secondary | ICD-10-CM | POA: Diagnosis not present

## 2019-10-30 DIAGNOSIS — L820 Inflamed seborrheic keratosis: Secondary | ICD-10-CM | POA: Diagnosis not present

## 2019-10-30 DIAGNOSIS — Z85828 Personal history of other malignant neoplasm of skin: Secondary | ICD-10-CM | POA: Diagnosis not present

## 2019-10-30 DIAGNOSIS — L821 Other seborrheic keratosis: Secondary | ICD-10-CM | POA: Diagnosis not present

## 2019-10-30 DIAGNOSIS — L245 Irritant contact dermatitis due to other chemical products: Secondary | ICD-10-CM | POA: Diagnosis not present

## 2019-11-10 DIAGNOSIS — Z5181 Encounter for therapeutic drug level monitoring: Secondary | ICD-10-CM | POA: Diagnosis not present

## 2019-11-10 DIAGNOSIS — M0579 Rheumatoid arthritis with rheumatoid factor of multiple sites without organ or systems involvement: Secondary | ICD-10-CM | POA: Diagnosis not present

## 2019-11-10 DIAGNOSIS — M8589 Other specified disorders of bone density and structure, multiple sites: Secondary | ICD-10-CM | POA: Diagnosis not present

## 2019-11-10 DIAGNOSIS — J849 Interstitial pulmonary disease, unspecified: Secondary | ICD-10-CM | POA: Diagnosis not present

## 2019-11-10 DIAGNOSIS — M810 Age-related osteoporosis without current pathological fracture: Secondary | ICD-10-CM | POA: Diagnosis not present

## 2019-11-13 DIAGNOSIS — R42 Dizziness and giddiness: Secondary | ICD-10-CM | POA: Diagnosis not present

## 2019-11-13 DIAGNOSIS — I10 Essential (primary) hypertension: Secondary | ICD-10-CM | POA: Diagnosis not present

## 2019-11-13 DIAGNOSIS — J849 Interstitial pulmonary disease, unspecified: Secondary | ICD-10-CM | POA: Diagnosis not present

## 2019-11-13 DIAGNOSIS — M0510 Rheumatoid lung disease with rheumatoid arthritis of unspecified site: Secondary | ICD-10-CM | POA: Diagnosis not present

## 2019-11-29 ENCOUNTER — Other Ambulatory Visit: Payer: Self-pay | Admitting: Primary Care

## 2019-12-09 ENCOUNTER — Other Ambulatory Visit (HOSPITAL_COMMUNITY)
Admission: RE | Admit: 2019-12-09 | Discharge: 2019-12-09 | Disposition: A | Discharge status: Home / Self Care | Payer: Medicare Other | Source: Ambulatory Visit | Attending: Internal Medicine | Admitting: Internal Medicine

## 2019-12-09 DIAGNOSIS — Z01812 Encounter for preprocedural laboratory examination: Secondary | ICD-10-CM | POA: Insufficient documentation

## 2019-12-09 DIAGNOSIS — Z20822 Contact with and (suspected) exposure to covid-19: Secondary | ICD-10-CM | POA: Diagnosis not present

## 2019-12-09 LAB — SARS CORONAVIRUS 2 (TAT 6-24 HRS): SARS Coronavirus 2: NEGATIVE

## 2019-12-12 ENCOUNTER — Other Ambulatory Visit: Payer: Self-pay

## 2019-12-12 ENCOUNTER — Ambulatory Visit (INDEPENDENT_AMBULATORY_CARE_PROVIDER_SITE_OTHER): Payer: Medicare Other | Admitting: Internal Medicine

## 2019-12-12 DIAGNOSIS — J8489 Other specified interstitial pulmonary diseases: Secondary | ICD-10-CM

## 2019-12-12 DIAGNOSIS — M359 Systemic involvement of connective tissue, unspecified: Secondary | ICD-10-CM | POA: Diagnosis not present

## 2019-12-12 LAB — PULMONARY FUNCTION TEST
DL/VA % pred: 86 %
DL/VA: 3.52 ml/min/mmHg/L
DLCO cor % pred: 71 %
DLCO cor: 13.91 ml/min/mmHg
DLCO unc % pred: 71 %
DLCO unc: 13.91 ml/min/mmHg
FEF 25-75 Pre: 1.33 L/sec
FEF2575-%Pred-Pre: 87 %
FEV1-%Pred-Pre: 71 %
FEV1-Pre: 1.48 L
FEV1FVC-%Pred-Pre: 105 %
FEV6-%Pred-Pre: 71 %
FEV6-Pre: 1.88 L
FEV6FVC-%Pred-Pre: 105 %
FVC-%Pred-Pre: 68 %
FVC-Pre: 1.9 L
Pre FEV1/FVC ratio: 78 %
Pre FEV6/FVC Ratio: 100 %

## 2019-12-12 NOTE — Progress Notes (Signed)
Spirometry and Dlco done today. 

## 2019-12-18 ENCOUNTER — Ambulatory Visit (INDEPENDENT_AMBULATORY_CARE_PROVIDER_SITE_OTHER): Payer: Medicare Other | Admitting: Internal Medicine

## 2019-12-18 ENCOUNTER — Encounter: Payer: Self-pay | Admitting: Internal Medicine

## 2019-12-18 ENCOUNTER — Other Ambulatory Visit: Payer: Self-pay

## 2019-12-18 VITALS — BP 140/70 | HR 81 | Temp 97.7°F | Ht 66.0 in | Wt 140.0 lb

## 2019-12-18 DIAGNOSIS — M359 Systemic involvement of connective tissue, unspecified: Secondary | ICD-10-CM | POA: Diagnosis not present

## 2019-12-18 DIAGNOSIS — J849 Interstitial pulmonary disease, unspecified: Secondary | ICD-10-CM

## 2019-12-18 DIAGNOSIS — N952 Postmenopausal atrophic vaginitis: Secondary | ICD-10-CM | POA: Diagnosis not present

## 2019-12-18 DIAGNOSIS — J45909 Unspecified asthma, uncomplicated: Secondary | ICD-10-CM

## 2019-12-18 DIAGNOSIS — J8489 Other specified interstitial pulmonary diseases: Secondary | ICD-10-CM

## 2019-12-18 DIAGNOSIS — Z5181 Encounter for therapeutic drug level monitoring: Secondary | ICD-10-CM

## 2019-12-18 DIAGNOSIS — R5383 Other fatigue: Secondary | ICD-10-CM | POA: Diagnosis not present

## 2019-12-18 NOTE — Addendum Note (Signed)
Addended by: Vanessa Barbara on: 12/18/2019 05:46 PM   Modules accepted: Orders

## 2019-12-18 NOTE — Patient Instructions (Addendum)
ICD-10-CM   1. Interstitial lung disease due to connective tissue disease ([place])  J84.89    M35.9   2. Encounter for therapeutic drug monitoring  Z51.81   3. Asthma, unspecified asthma severity, unspecified whether complicated, unspecified whether persistent  J45.909   4. Fatigue, unspecified type  R53.83      #asthma  -- improved control with medrol 8mg  per day but high bp could be due to higher medrol  Plan -Reduce Medrol to 8 mg/day alternating with 4 mg/day -Continue Dulera  - CMA will ensure refill -?  Are you on Singulair -if so we will make a note of this in the med list - continue acid reflux control   #ILD due to RA  - clinically stable. Symptoms better and similar to nov 2020 but below sept 2020 baseline - breathing test seems sstable  plan   - continue ofev 100mg  once daily  - glad you are tolerating it well and liver function test at [facility] April 2021 was normal   -assess increase to 150mg  twice daily at followup in 12 weeks or when you call us back when you are running out of nintedanib   --Continue  Rituxan in setting of Ofev -Next dose September 2021 per Dr. Stann Mainland = Medrol for rheumatoid arthritis as above -Refer to pulmonary rehabilitation at [facility] with fatigue]   Followup -In 6-8 weeks: Telephone visit 15-minute with nurse practitioner to see if you want to increase nintedanib dose and ensure rehab referral kicked off -Check liver function test in 3 3-4 months -Repeat spirometry and DLCO in 3-4 months -Repeat high-resolution CT chest [1 year follow-up] in 3-4 months

## 2019-12-18 NOTE — Progress Notes (Signed)
Brief patient profile:  19 yowf  never smoker with allergies/inhalers as child outgrew by [employer] then  RA since around 2000  Prednisone  x decades and prev eval by Dr Joya Gaskins around 2004 for sob resolved s maint rx and referred 05/26/2013 by Dr Shelia Media for bronchitis and abn cxr   History of Present Illness  05/26/2013 1st [facility] office visit/ Wert cc June 2014 dx pna  In [place] and remicade stopped and 100% better and placed arencia in September 2014  then abruptly worse first week in November with cough green sputum s nasal symptoms, fever low grade and no cp or cough and completely recovered prior to [place] does not recall abx but issue is why keeps getting sick and abn CT Chest (see below).   Arthritis symptoms well controlled at present on Rx for RA rec Nexium 40 mg Take 30-60 min before first meal of the day and add pepcid 20 mg one at bedtime whenever coughing.     10/19/2017  f/u ov/Wert re:  RA lung dz  Chief Complaint  Patient presents with  . Follow-up    Cough is much improved, but has not resolved yet. Cough is non prod. She has not had to use her neb.   Dyspnea:  Not limited by breathing from desired activities  But some doe x steps Cough: daytime > noct dry  Sleep: fine  SABA use:  No saba Medrol 4 mg a/w 58m per day/ ok control of arthritis  rec Start back on gabapentin up to 300 mg each am  in addition to the the two at bedtime  If not better increase the medrol to 8 mg daily until bettter then taper back to where you      01/10/2018  f/u ov/Wert re:  RA  Lung dz Medrol  4 mg  One alternating with a half Chief Complaint  Patient presents with  . Follow-up    PFT's done. Her breathing has been gradually worsening since the last visit. She has occ cough- non prod.   Dyspnea gradually worse since last ov:  MMRC1 =  MMRC3 = can't walk 100 yards even at a slow pace at a flat grade s stopping due to sob    Gradually x 3 m / more fatigue / no change in  arthritis  Cough: not an issue rec Protonix 40 mg Take 30-60 min before first meal of the day  GERD diet   01/17/2018 acute extended ov/Wert re: cough on medrol 4 mg  One a/w one half  Chief Complaint  Patient presents with  . Acute Visit    started coughing 01/11/18- occ prod with minimal green sputum.  She states also wheezing and having increased SOB.    abruptly worse 01/11/18 with severe 24/7 coughing >>  prod min green mucus esp in am/ assoc with subjective wheeze and did not follow previous contingencies re flutter / saba/ increase medrol and admits she does not rember those written instructions nor how to use the neb provided .  No fever/ comfortable at rest sitting  rec For cough > mucinex dm 1200 mg every 12 hours and cough into the flutter valve as much as possible  Doxycycline 100 mg twice daily x 10 days with glass of water Medrol 441mx 2 now and take 2  daily until cough is better then 1 daily x 5 days and then resume the previous dose  Shortness of breath/ wheezing/ still coughing >  albuterol neb every 4 hours as needed      01/20/2018 acute extended ov/Wert re: refractory cough and sob 01/11/18 Chief Complaint  Patient presents with  . Acute Visit    she is not feeling better, coughing , very SOB, wheezing  mucus now clear/scant  on doxy/ neb machine not working (tube would not plug into the side s adequate force and she was not capable of applying it due to RA hands. Cough/ wheeze/ sob 24/7 / flutter not helping/ can't lie down at hs   rec While coughing protonix 40 mg Take 30- 60 min before your first and last meals of the day  Shortness of breath/ wheezing/ still coughing > albuterol neb every 4 hours as needed  Depomedrol 120 mg IM and medrol 32 mg daily x 2 days,  then 16 mg x 3 days,  Then 8 mg x 4 days , then resume the 4 mg daily  For severe cough > tylenol 3# one every 4 hours if needed  Go to ER if condition worsens on above plan       Date of admission:  01/22/2018             Date of discharge: 01/27/2018   History of present illness: As per the H and P dictated on admission, "[name] a77 y.o.female,w Rheumatoid arthritis, ILD Asthma, apparently c/o increase in dyspnea this evening. Dry cough. Denies fever, chills, cp, palp, N/v, diarrhea, brbpr, black stool. Pt notes recently being given steroid injection in office as well as being placed on doxycycline. This might have helped slightly but pt worse Hospital Course:  Summary of her active problems in the hospital is as following. 1 dyspnea/hypoxemia/ILD Concerned that likely GERD Is causing ILD. Patient with cough.  Patient with complaints of awakening with cough and also with oral intake which is slightly improved since 01/24/2018. assessed by speech therapy and speech therapy raising concern of esophageal component but no signs of aspiration.  2D echo with a EF of 55 to 60% with no wall motion abnormalities, grade 1 diastolic dysfunction.  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 Pulmonary felt that the patient should be on scheduled Reglan. I have placed the patient on scheduled potassium before sleep. Continue steroids on discharge continue Mucinex and Claritin as well as inhalers. Patient will follow-up with pulmonary outpatient  2. Gastroesophageal reflux disease Continue PPI and H2 blocker.I changed PPI to [facility].  3. Rheumatoid arthritis Outpatient follow-up.   4. Anxiety Continue Effexor.    All other chronic medical condition were stable during the hospitalization.  Patient was ambulatory without any assistance. On the day of the discharge the patient's vitals were stable , and no other acute medical condition were reported by patient. the patient was felt safe to be discharge at home with family.  Consultants: PCCM  Procedures: Echocardiogram      03/21/2018  f/u ov/Wert re:   S/p admit was  transiently better  and downhill since Labor day on medrol 4 mg daily  Last orencia on Sept 4th 2019  Chief Complaint  Patient presents with  . Acute Visit    Per patient, she has had a dry cough since July 2019. She has been wheezing as well. Increased fatigue. Body aches. Denies any fever or chills.   Dyspnea:  MMRC4  = sob if tries to leave home or while getting dressed   Cough: harsh/ hacking mostly dry/ has flutter not using    SABA  use: not much better with rx   No obvious day to day or daytime variability or assoc excess/ purulent sputum or mucus plugs or hemoptysis or cp or chest tightness, subjective wheeze or overt sinus or hb symptoms.     Also denies any obvious fluctuation of symptoms with weather or environmental changes or other aggravating or alleviating factors except as outlined above   No unusual exposure hx or h/o childhood pna/ asthma or knowledge of premature birth.   INpatient consult 03/26/18 79 year old with rheumatoid arthoritis.At baseline the patient lives at home with her husband and is independent of ADLs.  Has been on many immune suppressants over decades and curently on orencia x 4 year and prednisone. Does not recollect being on bactrim/dapsone. Chart mentions BOOP/MAI in 2001 but she denies this. 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 Got admitted. Rx with steroids. Per Notes - clnical suspicion of  arytenoid inflmmation related wheeze noticed (she also reports asthma NOS). Follolwup with ENT recommended (but not seen one as yet). She also appears to have passed swallow with rec for regular diet with thin liquids but did to have mild eso stricture and reflux during testing . PFT shows 10% FVC decline for first ime. CT chest at this tme (aug 2019) showed new rLL infiltrate. ECHO July 2019 without evicence of  elevated PASP and saw cards [facility] June 2019 and was considered to have worsenin dyspnea due to [facility] issues (reports stress test at [facility] that was normal but I cannot see it)  She reports after discharge she got better but in last several weeks has deteriorated with cough and dyspnea. There is new hypoxemia (currently RA with nail polish and poor circulation - 89% pulse ox) needing 2L [place]. Per [facility] improved with steroids and abx. CTA 03/24/2018 => shows that RLL inifltrate has improved . Other chronic ILD changes + and small  Hiatal hernia + witthout change.  Review of lab work does show eosinophilia at time of admision  EVENTS 03/21/18 - IgE -5, blood allegy panel - negative, 03/23/2018 - - admit . HIGH EOS 2300, ESR 48, BNP 89 , HIV neg 9/12- PCT negative, RVP negative 9/14 -  PCT < 0.1, Urine strep - negative, MRSA PCR - positive. IgE - normal 4, Blood allergy panel repeat - negative 03/26/2018 - leading consideration for airway (BO in RA +/- asthma) related flare either due to MRSA bronchitis or clinical suspicion of arytenoid inflmmation +/- GERD relatd flare (she has small hiatal hernia)  +/- ? Dysphagia  up causing mild hypoxemia acute resp failure, wheeze . Allergy and IgE blood work negative thought. Patient reports being better but says she is choking on drinkin water [facility] MD says wheeze improved significantly with steroids.  RN says was down to RA yesterday evening but needed 1L [place] at sleep. Today -Room air at rest 94% and desaturated to 86% walking 60 feet 03/27/2018  - better. Off o2 at rest. STill coughs with water and when lies down.  Husband at bedside. Both requesting ILD clinic followup . Desaturated t 79% walking 90 feet.   OV 04/12/2018  Subjective:  Patient ID: Stacey [name], female , DOB: 1941-02-17 , age 87 y.o. , MRN: [id number] , ADDRESS: [place] [address]   04/12/2018 -   Chief  Complaint  Patient presents with  . Consult    Pt is a former  [facility] pt.  Pt denies any current complaints of cough, SOB, or CP but states the cough she originally had ended her up in the hosp 9/11-9/17 with dx acute respiratory failure. Pt does wear 2pulse with exertion and also wears 2L continuous when at home.     HPI Stacey [name] 79 y.o. -presents for follow-up to the ILD clinic.  She is known to have rheumatoid arthritis with ILD changes.  She had been followed by Dr. Christinia Gully.  However in July 2019 in September 2019 she has had 2 admissions to the hospital with respiratory distress and hypoxemic respiratory failure.  In the first 1 that seem to be right lower lobe infiltrate and then she improved from it but in the second 1 even though the right lower lobe infiltrates were better she still was hypoxemic.  Acid reflux and dysphagia was considered a possible etiology but she passed swallow study 2 times.  They thought she had some reflux.  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  At the same time it is not clear if the ILD is progressive based on pulmonary function testing below   At this point in time she tells me that she is getting home physical therapy.  Her fatigue is improving but it is not fully resolved.  She was discharged on continuous oxygen which she is using.  However she is feeling less short of breath.  Today in fact when we turned her oxygen off and walked her she did not desaturate and this is a significant improvement.  She is on monthly Orencia through the [facility] rheumatologist Dr. Eda Paschal.  At this point in time she is put the Orencia on hold.  She told me that she is been on Orencia for 4 years and never had a respiratory exacerbation still recently x 2.  Although before going on Orencia she had pneumonia while on Remicade and the Remicade.  In terms of her rheumatoid arthritis she hardly has any pain.  Her joint architecture is fairly well-preserved because of various  immunomodulators over time.  She says that she was on Remicade for years and when she stopped it for 8 weeks before the switch to [place] she never really had a relapse in her rheumatoid arthritis.  She is largely pain and stiffness free.  She believes she can go without  her Orencia for a while.  Review of the literature shows greater than 10% chance of a respiratory infection especially COPD exacerbation.  Although the time frame for this is unclear.       OV 06/07/2018  Subjective:  Patient ID: Stacey [name], female , DOB: 1941/06/06 , age 5 y.o. , MRN: [id number] , ADDRESS: [place] [address]   06/07/2018 -   Chief Complaint  Patient presents with  . Follow-up    ILD, PFT done today, some wheezing and coughing but better tha before   Rheumatoid arthritis ILD and asthma/obstructive lung disease phenotype on Dulera  HPI Stacey [name] 79 y.o. -presents for routine follow-up with her husband.  She is here to follow-up with [facility] Dr. Stann Mainland.  She plans to do this in December 2019.  She continues to be off Orencia.  Her joints are slowly getting stiff again.  She believes that she will need to be back on immunosuppression agent again.  She currently  continues Medrol 4 mg alternating with 2 mg.  This for her rheumatoid arthritis.  In terms of her joints she continues on Medrol 4 mg alternating with 2 mg but not on any other immunosuppression agent.  Overall she is been stable but for the last 2 weeks has had green sputum and wheezing and chest congestion and cough.  She recently visited her husband who was hospitalized and walking the long hallways at [facility] made a short of breath but she thinks this is probably baseline for her.  There are no other new issues.  She did have spirometry and DLCO and this shows a decline compared to September 2019 and a similar to July 2019.  It is documented below.  This is probably reflective of a  flareup   OV 07/21/2018  Subjective:  Patient ID: Stacey [name], female , DOB: 1941/05/03 , age 39 y.o. , MRN: [id number] , ADDRESS: [place] [address]   07/21/2018 -   Chief Complaint  Patient presents with  . Follow-up    Pt states she has been doing well since last visit. States she is about to begin Rituxan with [facility]. Pt still becomes SOB with exertion. Denies any complaints of cough or CP.   Rheumatoid arthritis ILD and asthma/obstructive lung disease phenotype on Dulera  HPI Stacey [name] 79 y.o. -presents for follow-up of the above.  Last seen just before Thanksgiving 2019.  In the interim overall stable although on June 23, 2018 she climbs a steep flight of stairs which is unusual exertion for her and she became very dyspneic.  Following day saw Dr. Stann Mainland at [facility] rheumatology and was given Z-Pak and prednisone and started feeling better.  Although it is not fully clear to me she had fever and bronchitic symptoms.  I reviewed Dr. Stann Mainland note.  Dr. Stann Mainland is decided to start Rituxan for rheumatoid arthritis.  She is only having some minimal joint pain at this point.  She is off Orencia and continues to be off [place].  She did have some blood work with [place] before starting Rituxan.  She is due to see Dr. Stann Mainland within the next week and start her Rituxan.  Her liver function test July 18, 2017 is normal hemoglobin is normal.  CRP is also normal.  We did spirometry and walking desaturation test.  These show improvement compared to before and these are documented below.  Currently she not using nighttime or daytime oxygen.  She is wondering if she could switch rheumatology care to [facility].  This is because while she likes [employer] she is getting older and more frail and feels some body local would be of help.  I have sent a message to Dr. Estanislado Pandy inquiring.  Certainly we can help her with Rituxan infusions at [facility]  if needed.  She will check on this with her [facility] rheumatologist.       OV 03/27/2019  Subjective:  Patient ID: Stacey [name], female , DOB: 12/31/1940 , age 3 y.o. , MRN: [id number] , ADDRESS: [place] [address]  Rheumatoid arthritis ILD and asthma/obstructive lung disease phenotype on [facility]   03/27/2019 -  Rourine fu   HPI Stacey [name] 79 y.o. -presents for the above.  Last seen in January 2020.  After that she has seen Dr. Stann Mainland rheumatology at [facility].  She is now getting Rituxan 2 doses every 6 months.  She says this  is helped her joints and her stiffness.  She is a little bit more mobile than usual.  However in terms of her respiratory status she continues to have episodic cough.  In June 2020 she again got hypoxemic and got admitted.  Since then she has had episodic cough.  She had a respiratory exacerbation in June 2020 for the admission she got steroids.  This seemed to help.  She is also on a higher dose Dulera right now.  In terms of her cough this seems to be her biggest problem.  She seems to be on Dulera, Singulair scheduled with also Tessalon and Delsym and DuoNeb.  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  Her last high-resolution CT scan of the chest was 1 year ago.  She says the dyspnea itself is not worse.  She is currently not using oxygen.  On exam she did have some wheezing.  Currently she has white and brown sputum but this is baseline.  In terms of a COVID wrist she has been tested recently couple of times and this is been negative.  She is isolating well.  She wanted to know COVID prevention activities and risk status and masking strategies.      OV 05/24/2019  Subjective:  Patient ID: Stacey [name], female , DOB: May 20, 1941 , age 86 y.o. , MRN: [id number] , ADDRESS: [place]  [id number]   05/24/2019 -   Chief Complaint  Patient presents with  . Follow-up    Pt was recently in the hosp due to ILD. Pt states that she has been better since being out of the hosp.   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 -returns for follow-up.  At the last visit approximately 2 months ago she was reporting worsening cough following an admission in summer 2020.  Her pulmonary function test suggested worsening ILD status.  Therefore we requested a high-resolution CT chest which she did in October 2020.  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  However in the interim after the CT scan was done towards the end of October 2020 she developed worsening of her cough over 2 weeks and also associated shortness of breath but significantly the cough is much worse.  She ended up getting admitted to the hospital.  There was some hypoxemia.  By this time she had finished a ENT evaluation that did not show any involvement of the arytenoids.  Pulmonary was consulted.  She was given a prednisone burst which she just finished I believe yesterday.  She is back on her baseline Medrol.  And she is feeling better.  Her oxygen status is improved although she is using oxygen at night now.  She is really frustrated with these recurrent flareups and these admissions which ended up with her having a wheeze and also hypoxemia.  Currently she is on her baseline Medrol for rheumatoid arthritis associated with Dulera and Singulair.  She is also on losartan for blood pressure.  Her walking desaturation test is slightly worse than baseline.  She has a GI consult pending because of the recurrent episodes of cough and flareups.  We went over exposure history.  We used interstitial lung disease questionnaire for the exposure history.  Specifically she denies any  electronic cigarette use of marijuana use of cocaine use or any IV drug abuse.  [place] last 14 years.  Asked extensive questions about the home environment it is positive for nebulizer use but the nebulizer does not have mildew or mold in it.  Otherwise no organic antigen exposure.  The house is not damp.  There is no mold or mildew in the shower curtain.  There is no humidifier use no steam iron use.  No Jacuzzi use.  No misting [place] outside to inside the house.  No pet birds.  No pet gerbils no feather pillows.  There is no mold in the [facility] duct.  She does not do any gardening.  Does not use wind instruments.  Also 122 question occupational history elicited and essentially negative.  The other issue is that she has polypharmacy.  She is asking for my help in reducing her medications.  She is on 3 medications for postherpetic neuralgia.  She is on losartan      OV 06/21/2019  Subjective:  Patient ID: Stacey [name], female , DOB: 15-Jul-1940 , age 41 y.o. , MRN: [id number] , ADDRESS: [place] [address]   06/21/2019 -   Chief Complaint  Patient presents with  . televisit    hosp 11/29-12/4 due to covid with pna. pt said that she is doing okay after recent hosp but states she has no energy.      Stacey [name] 79 y.o. - last visit 05/24/2019. Diagnosed with covid-19 on 05/31/2019. Admitted 06/11/2019  - 06/16/2019. Quarantine ends 06/21/2019 today. Rx with Remdesviri and Decadron. Husband also on phone. Questions  1. Quarantine ends - from 06/22/19\ and she is not contagious and likely resistant to reinfection to covid for another few months  2. She is on 10 day dexamethasone for covid - today is last day for it  3. She is on dulera. Hospital gave combivent and she does not want do combivent - this is fine  4. Ofev for ILD - not started it yet . She had questions about side effects. Explained GI and  LFT monirtong. SHe wanted to wait till Jan 2021 and start it . I am ok with that.   5. Small hiat   IMPRESSION: HRCT OCt 2020 1. 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 Findings have progressed since 2017 and 2019 high-resolution chest CT studies. 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 Findings are consistent with UIP per consensus guidelines: Diagnosis of Idiopathic Pulmonary Fibrosis: An Official ATS/ERS/JRS/ALAT Clinical Practice Guideline. Page, Iss 5, [phone], Mar 13 2017. 2. 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 4. Small hiatal hernia.  Aortic Atherosclerosis (ICD10-I70.0).   Electronically Signed   By: Ilona Sorrel M.D.   On: 04/24/2019 13:29   OV 08/03/2019 - face to face visit  Subjective:  Patient ID: Stacey [name], female , DOB: March 25, 1941 , age 17 y.o. , MRN: [id number] , ADDRESS: [place] [address]    Rheumatoid arthritis with Joint s/ILD- Rituxan/steroids ([facility])  - progressive phenotype (CT 2017/2019 -> Oct 2020). Planned Ofev start in Nov 2020 but did not start as of dec 2020. LFT normal Jan 2021  Athma/obstructive lung disease phenotype on Engineer, production  herpetic neuralgia - on elavil, gabapentin, effexor  Coronary Artery Calcification - 1 vessel - cardiology referral done  Diagnosed with covid-19 on 05/31/2019. Admitted 06/11/2019  - 06/16/2019. Rx with Remdesviri and Decadron.  Small Hiatal hernia onCT Oct 2020  Rib fracture from fall Dec 2020: Musculoskeletal: There is nondisplaced fractures of the posterior left fifth, 6, 8, 9, and tenth left ribs. There is unchanged slight superior compression deformities of the T5, T6, T10 vertebral bodies with less than 25% loss in height.Associated  pulmnary contusion +    HPI Stacey [name] 79 y.o. -presents for a visit. Her nintedanib has been delayed because of COVID-19. Also subsequently a fall. She saw a nurse practitioner on July 20, 2019 and they took a shared decision to start nintedanib. She had a ? telephone visit with Dr. Candie Mile the rheumatologist at [facility]. Patient scheduled for her next Rituxan in February 2021 but reviewed the notes indicates this could be pushed to early spring 2021. Dr. Stann Mainland is okay with the patient study got intubated at this point in time.  Patient has follow-up with Dr. Stann Mainland tomorrow at [facility].  At this face-to-face visit she is here with her husband.  She says she is much better after the Covid and also the fall that fractured her ribs.  Nevertheless all this is left her fatigue.  Infective fatigue scores are much worse.  Also in the last 2 weeks has had increased cough wheezing and shortness of breath.  The sputum was also changed color in the last 1 week to clear green.  This is all new.  Her symptom score is therefore a worse than her baseline.  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  She is only taking 100 mg once a day.  The plan was to go up to 100 mg twice a day next week.  This is a minimum effective dose.  The maximum dose is 150 mg twice a day.  Given her age and comorbidities we are starting at a low dose.  Part of this visit is to make sure that she is tolerating the drug fine.  And so far she is.  She is interested in the ILD-pro registry study done by the [facility].   She is working with physical therapy for her fatigue.  Ambulatory Walk 07/20/2019 2 Lap- O2 92% RA; HR 109 No shortness of breath She did not need to stop Used walker    OV 09/20/2019  Subjective:  Patient ID: Stacey [name], female , DOB: 1940/10/16 , age 61 y.o. , MRN: [id number] , ADDRESS: [place] [id number]   09/20/2019 -   Chief Complaint  Patient presents with  . Televisit    Called and spoke with pt who stated she has been feeling okay since last visit. Pt stated she is still taking OFEV and denies any complaints. Pt states she believes her breathing is stable at this point.    Rheumatoid arthritis with Joint s/ILD- Rituxan/steroids ([facility])  - progressive phenotype (CT 2017/2019 -> Oct 2020). Planned Ofev start in Nov 2020 but did not start as of dec 2020. 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 Admitted 06/11/2019  - 06/16/2019. Rx with Remdesviri and Decadron.  Small Hiatal hernia on CT Oct 2020  Rib fracture from fall Dec 2020: Musculoskeletal: There is nondisplaced fractures of the posterior left fifth, 6, 8, 9, and tenth left ribs. There is unchanged slight superior compression deformities of the T5, T6, T10 vertebral bodies with less than 25% loss in height.Associated pulmnary contusion +   HPI Stacey [name] 79 y.o. - has 2nd covid vaccine later this week. Rituxan is end of the month. Baseline RA regiment is On dulera for associated obstructio of lung. Continue ofev 124m bid since mid jan 2021 for ILD. No side effects. Did see BDerl Barrowfor face to face visit early feb 2021 -> for bronchitis and felt better after prdnisone and zpak. Currently on medrol   8 mg day and staying there per BDerl Barrow. Wants t oknow if she can taper. Wants to know how she can prevent care flare ups. Explained ofev, masking and  social distancing prevent respiratory infection.Denies choking on food or aspirating. Denies mold in house - relatively news. Denies dog. Denies cat. Denies carious teeth. Denies post nasal drip  OVerll feels better and improved dyspnea.     OV 12/18/2019  Subjective:  Patient ID: MTamala [name] female , DOB: 6November 02, 1942, age 79y.o. , MRN: [id number], [place] [id number] PCP RShon Baton MD Rheumatologist-Dr. JEda [facility]chalat [facility]: Dr. RVeryl Speak 12/18/2019 -   Chief Complaint  Patient presents with  . Follow-up    no worse   Rheumatoid arthritis with Joint s/ILD- Rituxan/steroids ([facility])  - progressive phenotype (CT 2017/2019 -> Oct 2020). Planned Ofev start in Nov 2020 but did not start as of dec 2020. 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 Admitted 06/11/2019  - 06/16/2019. Rx with Remdesviri and Decadron.  Small Hiatal hernia on CT Oct 2020  Rib fracture from fall Dec 2020: Musculoskeletal: There is nondisplaced fractures of the posterior left fifth, 6, 8, 9, and tenth left ribs. There is unchanged slight superior compression deformities of the T5, T6, T10 vertebral bodies with less than 25% loss in height.Associated pulmnary contusion +  -On Reclast once a year through Dr. JennEda PaschalPI MargTamala Fothergilly68. 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  It has been a few month since I last saw her.  In this time she did not taper  her Medrol down.  She stated 8 mg/day.  She was originally taking the Medrol for her rheumatoid arthritis for many years.  She recently increased the dose from 4 mg / 2 mg every other day to 8 mg daily because of recurrent respiratory exacerbations.  She  is asking for [facility] refills for obstructive lung disease.  She is getting Rituxan through Dr. Eda Paschal for rheumatoid arthritis.  Her next Rituxan is in September 2021.  She saw Dr. Stann Mainland in April 2021 and reviewed the note.  Her liver function test at the time was fine.  In terms of her ILD she continues on nintedanib 100 mg twice daily.  She says she has no side effects from the drug she is tolerating it quite well.  We discussed about increasing the dose but she wants to hold off because she just got a new supply.  However she is open to increasing the dose when the supply runs out.  She is due for liver function test today.  In terms of overall symptoms things have improved as can be seen below and the symptom score.Marland Kitchen  However her main concern is that of fatigue.  She is open to attending pulmonary rehabilitation.  She is not using oxygen at home.  Sometime back when we tested overnight oxygen desaturation test she did not desaturate.  Walking desaturation test today stable.  She does notice of note that her blood pressure has gone up.  She is working with her primary care physician on this.  She is on losartan.     SYMPTOM SCALE - ILD 03/27/2019  05/24/2019 (2-3 weeks pre cpovid)  08/03/2019 Post covid Post fall rib # 12/18/2019   O2 use ra     Shortness of Breath 0 -> 5 scale with 5 being worst (score 6 If unable to do)     At rest 0 '2 3 2  '$ Simple tasks - showers, clothes change, eating, shaving 0 '3 2 3  '$ Household (dishes, doing bed, laundry) '2 4 5 3  '$ Shopping '2 3 6 4  '$ Walking level at own pace '2 3 4 4  '$ Walking up Stairs '3 3 5 4  '$ Total (40 - 48) Dyspnea Score '9 17 25 20  '$ How bad is your cough? '2 2 3 1  '$ How bad is your fatigue 3 3.5 5  3.5  nause    0  vomit    0  diarrhea    0  anxiety    0  depression    0    Results for ADDILYN, SATTERWHITE (MRN [id number]) as of 03/27/2019 09:43  Ref. Range 07/17/2013 09:53 02/07/2016 13:09 01/11/2017 11:05 01/10/2018 10:13 05/17/2018 14:24 06/07/2018 10:57 07/21/2018 10:53 03/27/2019 09:02 6/1  FVC-Pre Latest Units: L 2.13 2.16 2.14 1.93 1.99 1.81 2.18 1.79 1.90  FVC-%Pred-Pre Latest Units: % 68 71 71 65 75 68 77 64    Results for NILAH, BELCOURT (MRN [id number]) as of 03/27/2019 09:43  Ref. Range 07/17/2013 09:53 02/07/2016 13:09 01/11/2017 11:05 01/10/2018 10:13 05/17/2018 14:24 06/07/2018 10:57 07/21/2018 10:53 03/27/2019 09:02 6/1  DLCO unc Latest Units: ml/min/mmHg 18.85 18.25 16.50 16.09 16.58 14.06  15.04 13.91  DLCO unc % pred Latest Units: % 69 67 61 59 70 59  77 71%    Simple office walk  feet x  3 laps goal with forehead probe 04/12/2018  07/21/2018  05/24/2019  12/18/2019   O2 used Room air - off o2 x 10 min Room air  Room air ra  Number laps completed 3 x 185 feet 3 x 250 feet    Comments about pace normal normal    Resting Pulse Ox/HR 99% and 72/min 97% and 74/min 97% and 81/min 97% and 76/m  Final Pulse Ox/HR 98% and 95/min 96%  and 96/min 94% and 107/min 93% and 94  Desaturated </= 88% no no no no  Desaturated <= 3% points no no Yes, 3 pints Ues, 4 points  Got Tachycardic >/= 90/min yes yes yes yes  Symptoms at end of test No complaint Mild dyspnea Mod dyspnea Some dyspnea  Miscellaneous comments improed from hospital Same v improved woprse same     Results for RUBA, OUTEN (MRN [id number]) as of 09/20/2019 09:06  Ref. 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   reports that she has never smoked. She has never used smokeless tobacco.  Past Surgical History:  Procedure Laterality Date  . CHOLECYSTECTOMY  2011  . WISDOM TOOTH EXTRACTION      Allergies  Allergen Reactions  . Penicillins Other (See Comments)    Reaction unknown occurred during childhood Has patient had a PCN reaction causing immediate rash, facial/tongue/throat swelling, SOB or lightheadedness with hypotension: Unknown Has patient had a PCN reaction causing severe rash involving mucus membranes or skin necrosis: Unknown Has patient had a PCN reaction that required hospitalization: No Has patient had a PCN reaction occurring within the last 10 years: No If all of the above answers are "NO", then may proceed with Cephalosporin use.  Unsur  . Remicade [Infliximab] Other (See Comments)    Reaction unknown  . Sulfa Antibiotics Other (See Comments)    Reaction unknown  . Sulfamethoxazole Other (See Comments)    Unsure of reaction As a child not sure     Immunization History  Administered Date(s) Administered  . Influenza Split 04/12/2013, 04/12/2014, 03/14/2015, 04/23/2016  . Influenza, High Dose Seasonal PF 04/12/2017, 03/24/2018, 04/13/2019  . Influenza-Unspecified 04/03/2013, 04/22/2017  . Moderna SARS-COVID-2 Vaccination 08/25/2019, 09/22/2019  . Pneumococcal Conjugate-13 04/03/2013, 09/28/2014  . Pneumococcal Polysaccharide-23 07/13/2012, 07/24/2013  . Td 01/11/2018  . Tdap 12/10/2017    Family History  Problem Relation Age of Onset  . Asthma Mother   . Anemia Mother   . Polymyalgia rheumatica Mother   . COPD Father   . Pulmonary fibrosis Father   . Breast cancer Maternal Grandmother 26     Current Outpatient Medications:  .  amitriptyline (ELAVIL) 25 MG tablet, Take 25 mg by mouth at bedtime., Disp: , Rfl: 0 .  beta carotene  w/minerals (OCUVITE) tablet, Take 1 tablet by mouth daily., Disp: , Rfl:  .  cholecalciferol (VITAMIN D) 1000 UNITS tablet, Take 1,000 Units by mouth daily., Disp: , Rfl:  .  methylPREDNISolone (MEDROL) 4 MG tablet, Take 1 tablet (4 mg total) by mouth every other day. Alternate with the '2mg'$  dose, Disp:  , Rfl:  .  mometasone-formoterol (DULERA) 100-5 MCG/ACT AERO, Inhale 2 puffs into the lungs 2 (two) times daily., Disp: 13 g, Rfl: 0 .  Nintedanib (OFEV) 100 MG CAPS, Take 1 capsule (100 mg total) by mouth 2 (two) times daily., Disp: 180 capsule, Rfl: 3 .  pantoprazole (PROTONIX) 40 MG tablet, Take 1 tablet (40 mg total) by mouth 2 (two) times daily before a meal., Disp: 180 tablet, Rfl: 3 .  riTUXimab (  RITUXAN) 100 MG/10ML injection, Inject into the vein. Receives at [facility] but unsure of dose - no more doses for next 6 months as of 09/14/18, Disp: , Rfl:  .  venlafaxine XR (EFFEXOR-XR) 75 MG 24 hr capsule, Take 75 mg by mouth at bedtime. , Disp: , Rfl:  .  acetaminophen (TYLENOL) 325 MG tablet, Take 2 tablets (650 mg total) by mouth every 6 (six) hours as needed for mild pain or headache (fever >/= 101). (Patient not taking: Reported on 12/18/2019), Disp:  , Rfl:  .  albuterol (VENTOLIN HFA) 108 (90 Base) MCG/ACT inhaler, Inhale 2 puffs into the lungs every 6 (six) hours as needed for wheezing or shortness of breath. (Patient not taking: Reported on 12/18/2019), Disp: 18 g, Rfl: 0 .  dextromethorphan (DELSYM) 30 MG/5ML liquid, Take 2.5 mLs (15 mg total) by mouth at bedtime. (Patient not taking: Reported on 09/20/2019), Disp: 89 mL, Rfl: 0 .  gabapentin (NEURONTIN) 300 MG capsule, Take 600 mg by mouth at bedtime., Disp: , Rfl:  .  guaiFENesin (MUCINEX) 600 MG 12 hr tablet, Take 1 tablet (600 mg total) by mouth 2 (two) times daily. (Patient not taking: Reported on 09/20/2019), Disp: 30 tablet, Rfl: 1 .  ipratropium-albuterol (DUONEB) 0.5-2.5 (3) MG/3ML SOLN, Take 3 mLs by nebulization 3 (three) times daily.  (Patient not taking: Reported on 09/20/2019), Disp: 360 mL, Rfl: 0 .  losartan (COZAAR) 25 MG tablet, Take 25 mg by mouth daily., Disp: , Rfl:  .  polyethylene glycol (MIRALAX / GLYCOLAX) 17 g packet, Take 17 g by mouth daily as needed for mild constipation., Disp: , Rfl:  .  pravastatin (PRAVACHOL) 20 MG tablet, Take 1 tablet (20 mg total) by mouth every evening., Disp: 90 tablet, Rfl: 3      Objective:   Vitals:   12/18/19 1637  BP: 140/70  Pulse: 81  Temp: 97.7 F (36.5 C)  TempSrc: Oral  SpO2: 93%  Weight: 140 lb (63.5 kg)  Height: '5\' 6"'$  (1.676 m)    Estimated body mass index is 22.6 kg/m as calculated from the following:   Height as of this encounter: '5\' 6"'$  (1.676 m).   Weight as of this encounter: 140 lb (63.5 kg).  '@WEIGHTCHANGE'$ @  [employer]   12/18/19 1637  Weight: 140 lb (63.5 kg)     Physical Exam Frail lady with rheumatoid arthritis deformities.  She looks better than the previous visit.  Bilateral bibasal crackles present.  Otherwise nonfocal exam.         Assessment:       ICD-10-CM   1. Interstitial lung disease due to connective tissue disease ([place])  J84.89    M35.9   2. Encounter for therapeutic drug monitoring  Z51.81   3. Asthma, unspecified asthma severity, unspecified whether complicated, unspecified whether persistent  J45.909   4. Fatigue, unspecified type  R53.83        Plan:     Patient Instructions     ICD-10-CM   1. Interstitial lung disease due to connective tissue disease ([place])  J84.89    M35.9   2. Encounter for therapeutic drug monitoring  Z51.81   3. Asthma, unspecified asthma severity, unspecified whether complicated, unspecified whether persistent  J45.909   4. Fatigue, unspecified type  R53.83      #asthma  -- improved control with medrol '8mg'$  per day but high bp could be due to higher medrol  Plan -Reduce Medrol to 8 mg/day alternating with 4 mg/day -Continue Dulera  -  CMA will ensure refill -?  Are you on  Singulair -if so we will make a note of this in the med list - continue acid reflux control   #ILD due to RA  - clinically stable. Symptoms better and similar to nov 2020 but below sept 2020 baseline - breathing test seems sstable  plan   - continue ofev '100mg'$  once daily  - glad you are tolerating it well and liver function test at [facility] April 2021 was normal   -assess increase to '150mg'$  twice daily at followup in 12 weeks or when you call us back when you are running out of nintedanib   --Continue  Rituxan in setting of Ofev -Next dose September 2021 per Dr. Stann Mainland = Medrol for rheumatoid arthritis as above -Refer to pulmonary rehabilitation at [facility] with fatigue]   Followup -In 6-8 weeks: Telephone visit 15-minute with nurse practitioner to see if you want to increase nintedanib dose and ensure rehab referral kicked off -Check liver function test in 3 3-4 months -Repeat spirometry and DLCO in 3-4 months -Repeat high-resolution CT chest [1 year follow-up] in 3-4 months   ( Level 05 visit: Estb 40-54 min  in  visit type: on-site physical face to visit  in total care time and counseling or/and coordination of care by this undersigned MD - Dr Brand Males. This includes one or more of the following on this same day 12/18/2019: pre-charting, chart review, note writing, documentation discussion of test results, diagnostic or treatment recommendations, prognosis, risks and benefits of management options, instructions, education, compliance or risk-factor reduction. It excludes time spent by the [place] or office staff in the care of the patient. Actual time 17 min)    SIGNATURE    Dr. Brand Males, M.D., F.C.C.P,  Pulmonary and Critical Care Medicine Staff Physician, [place] Director - Interstitial Lung Disease  Program  Pulmonary [place] at [place], [place], [address]  Pager: [phone],  If no answer or between  15:00h - 7:00h: call [phone] Telephone: [phone]  5:18 PM 12/18/2019

## 2019-12-19 ENCOUNTER — Encounter (HOSPITAL_COMMUNITY): Payer: Self-pay | Admitting: *Deleted

## 2019-12-19 NOTE — Progress Notes (Signed)
Pt referred to pulmonary rehab again by Dr. Chase Caller for ILD.  Pt was referred back in February but did not respond to messages or letter sent by pulmonary rehab staff.  Clinical review of pt follow up appt on 6/7 Pulmonary office note.  Pt with Covid Risk Score - 6. Pt appropriate for tying  scheduling for Pulmonary rehab one final time.  Will forward to support staff for verification of insurance eligibility/benefits and to pulmonary rehab staff for scheduling with pt consent. Cherre Huger, BSN Cardiac and Training and development officer

## 2019-12-20 ENCOUNTER — Telehealth (HOSPITAL_COMMUNITY): Payer: Self-pay

## 2019-12-20 NOTE — Telephone Encounter (Signed)
Attempted to call pt in regards to pulmonary rehab and to go over insurance, [facility]. Gave pt ppw to the pulmonary rehab staff.

## 2019-12-20 NOTE — Telephone Encounter (Signed)
Pt insurance is active and benefits verified through Medicare a/b Co-pay 0, DED $203/$203 met, out of pocket 0/0 met, co-insurance 20%. no pre-authorization required.  2ndary insurance is active and benefits verified through [employer]. Co-pay 0, DED 0/0 met, out of pocket 0/0 met, co-insurance 0. No pre-authorization required.  

## 2019-12-26 ENCOUNTER — Telehealth: Payer: Self-pay | Admitting: *Deleted

## 2019-12-26 NOTE — Telephone Encounter (Signed)
Called to schedule patient for repeat spirometry and DLCO 60 minute visit in September.  Patient was on the other line.  I left a message with the gentleman that answered to have her call the office to schedule.  Verbalized understanding.

## 2019-12-27 ENCOUNTER — Telehealth (HOSPITAL_COMMUNITY): Payer: Self-pay

## 2020-01-18 ENCOUNTER — Other Ambulatory Visit: Payer: Self-pay

## 2020-01-18 ENCOUNTER — Encounter: Payer: Self-pay | Admitting: Cardiology

## 2020-01-18 ENCOUNTER — Ambulatory Visit (INDEPENDENT_AMBULATORY_CARE_PROVIDER_SITE_OTHER): Payer: Medicare Other | Admitting: Cardiology

## 2020-01-18 VITALS — BP 125/74 | HR 74 | Temp 96.4°F | Ht 68.0 in | Wt 163.4 lb

## 2020-01-18 DIAGNOSIS — Z7189 Other specified counseling: Secondary | ICD-10-CM

## 2020-01-18 DIAGNOSIS — E7800 Pure hypercholesterolemia, unspecified: Secondary | ICD-10-CM

## 2020-01-18 DIAGNOSIS — I10 Essential (primary) hypertension: Secondary | ICD-10-CM

## 2020-01-18 DIAGNOSIS — I2510 Atherosclerotic heart disease of native coronary artery without angina pectoris: Secondary | ICD-10-CM | POA: Diagnosis not present

## 2020-01-18 MED ORDER — PRAVASTATIN SODIUM 20 MG PO TABS: 20.0000 mg | ORAL_TABLET | Freq: Every evening | ORAL | 3 refills | Status: DC

## 2020-01-18 MED ORDER — LOSARTAN POTASSIUM 25 MG PO TABS: 25.0000 mg | ORAL_TABLET | Freq: Every day | ORAL | 3 refills | Status: DC

## 2020-01-18 NOTE — Progress Notes (Signed)
Cardiology Office Note:    Date:  01/18/2020   ID:  Stacey [name], DOB Dec 11, 1940, MRN [id number]  PCP:  Shon Baton, MD  Cardiologist:  Buford Dresser, MD PhD  Referring MD: Shon Baton, MD   CC: follow up  History of Present Illness:    Stacey [name] is a 79 y.o. female with a hx of ILD, rheumatoid arthritis on rituximab, hypertension, coronary calcification who is seen for follow up.  Cardiac history: She was admitted 12/2018 for shortness of breath and found to have elevated troponins. We had an extensive conversation during her admission with shared decision making regarding the elevation of her troponin. She preferred medical management over an invasive strategy. She had a rise and fall of troponins, with peak of 2.38 (old assay), but no chest pain. Echo without wall motion abnormalities. She received >48 hours of heparin and was started on aspirin and statin. Coronary calcium was seen on her CT, and we discussed high intensity statin. However, she was concerned about myalgias and preferred pravastatin.   Today: Has a question re: blood pressure and losartan. Was on losartan previously, stopped in 05/2019 when she had Covid, restarted this spring. Has a log in her phone. From 11/26/19 to the present, range has been 131/72-163/78, average ~140/70. Recent visit 12/18/19 with Dr. Chase Caller has BP charted as 140/70. Well controlled on today's visit.   Has lightheadedness on occasion, no full syncope. Denies vertigo. Makes her feel like she might lose her balance when walking.   Denies chest pain. Breathing unchanged. No PND, orthopnea, LE edema or unexpected weight gain. No syncope or palpitations.   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 (Patient not taking: Reported on 12/18/2019)   albuterol (VENTOLIN HFA) 108 (90 Base) MCG/ACT inhaler Inhale 2 puffs into the lungs every 6 (six) hours as needed for wheezing or shortness of breath. (Patient not taking: Reported on 12/18/2019)   amitriptyline (ELAVIL) 25 MG tablet Take 25 mg by mouth at bedtime.   beta carotene w/minerals (OCUVITE) tablet Take 1 tablet by mouth daily.   cholecalciferol (VITAMIN D) 1000 UNITS tablet Take 1,000 Units by mouth daily.   dextromethorphan (DELSYM) 30 MG/5ML liquid Take 2.5 mLs (15 mg total) by mouth at bedtime. (Patient not taking: Reported on 09/20/2019)   gabapentin (NEURONTIN) 300 MG capsule Take 600 mg by mouth at bedtime.   guaiFENesin (MUCINEX) 600 MG 12 hr tablet Take 1 tablet (600 mg total) by mouth 2 (two) times daily. (Patient not taking: Reported on 09/20/2019)   ipratropium-albuterol (DUONEB) 0.5-2.5 (3) MG/3ML SOLN Take 3 mLs by nebulization 3 (  three) times daily. (Patient not taking: Reported on 09/20/2019)   losartan (COZAAR) 25 MG tablet Take 25 mg by mouth daily.   methylPREDNISolone (MEDROL) 4 MG tablet Take 1 tablet (4 mg total) by mouth every other day. Alternate with the 2mg  dose   mometasone-formoterol (DULERA) 100-5 MCG/ACT AERO Inhale 2 puffs into the lungs 2 (two) times daily.   Nintedanib (OFEV) 100 MG CAPS Take 1 capsule (100 mg total) by mouth 2 (two) times daily.   pantoprazole (PROTONIX) 40 MG tablet Take 1  tablet (40 mg total) by mouth 2 (two) times daily before a meal.   polyethylene glycol (MIRALAX / GLYCOLAX) 17 g packet Take 17 g by mouth daily as needed for mild constipation.   pravastatin (PRAVACHOL) 20 MG tablet Take 1 tablet (20 mg total) by mouth every evening.   riTUXimab (RITUXAN) 100 MG/10ML injection Inject into the vein. Receives at [facility] but unsure of dose - no more doses for next 6 months as of 09/14/18   venlafaxine XR (EFFEXOR-XR) 75 MG 24 hr capsule Take 75 mg by mouth at bedtime.    No current facility-administered medications on file prior to visit.     Allergies:   Penicillins, Remicade [infliximab], Sulfa antibiotics, and Sulfamethoxazole   Social History   Tobacco Use   Smoking status: Never Smoker   Smokeless tobacco: Never Used  Vaping Use   Vaping Use: Never used  Substance Use Topics   Alcohol use: No   Drug use: No    Family History: The patient's family history includes Anemia in her mother; Asthma in her mother; Breast cancer (age of onset: 9) in her maternal grandmother; COPD in her father; Polymyalgia rheumatica in her mother; Pulmonary fibrosis in her father.  ROS:   Please see the history of present illness.  Additional pertinent ROS otherwise unremarkable.  EKGs/Labs/Other Studies Reviewed:    The following studies were reviewed today: Echo 12/18/18 1. The left ventricle has normal systolic function with an ejection fraction of 60-65%. The cavity size was normal. There is mildly increased left ventricular wall thickness. Left ventricular diastolic Doppler parameters are consistent with impaired  relaxation. No evidence of left ventricular regional wall motion abnormalities. 2. The right ventricle has normal systolic function. The cavity was normal. There is no increase in right ventricular wall thickness. Right ventricular systolic pressure normal with an estimated pressure of 27.8 mmHg. 3. The mitral valve is grossly normal. There is mild  mitral annular calcification present. 4. The tricuspid valve is grossly normal. There is mild tricuspid regurgitation. 5. The aortic valve is tricuspid with moderate calcification and thickening of the noncoronary cusp. 6. The aortic root is normal in size and structure.   EKG:  EKG is personally reviewed.  The ekg ordered today demonstrates NSR at 74 bpm  Recent Labs: 06/14/2019: Magnesium 2.1 07/04/2019: BUN 11; Creatinine, Ser 0.66; Hemoglobin 11.9; Platelets 233; Potassium 4.5; Sodium 134 09/21/2019: ALT 15  Recent Lipid Panel    Component Value Date/Time   CHOL 222 (H) 12/19/2018 1532   TRIG 79 06/11/2019 2338   HDL 59 12/19/2018 1532   CHOLHDL 3.8 12/19/2018 1532   VLDL 26 12/19/2018 1532   LDLCALC 137 (H) 12/19/2018 1532    Physical Exam:    VS:  BP 125/74    Pulse 74    Temp (!) 96.4 F (35.8 C)    Ht 5\' 8"  (1.727 m)    Wt 163 lb 6.4 oz (74.1 kg)    SpO2  95%    BMI 24.84 kg/m     Wt Readings from Last 3 Encounters:  12/18/19 140 lb (63.5 kg)  08/03/19 137 lb (62.1 kg)  07/20/19 137 lb 9.6 oz (62.4 kg)   GEN: Well nourished, well developed in no acute distress HEENT: Normal, moist mucous membranes NECK: No JVD CARDIAC: regular rhythm, normal S1 and S2, no rubs or gallops. No murmur. VASCULAR: Radial and DP pulses 2+ bilaterally. No carotid bruits RESPIRATORY:  Clear to auscultation without rales, wheezing or rhonchi  ABDOMEN: Soft, non-tender, non-distended MUSCULOSKELETAL:  Ambulates independently SKIN: Warm and dry, no edema NEUROLOGIC:  Alert and oriented x 3. No focal neuro deficits noted. PSYCHIATRIC:  Normal affect    ASSESSMENT:    1. Coronary artery calcification seen on CAT scan   2. Pure hypercholesterolemia   3. Essential hypertension   4. Cardiac risk counseling   5. Counseling on health promotion and disease prevention    PLAN:    Prior hospitalization for shortness of breath with elevated troponin: feeling much improved. Had prolonged  discussion during hospitalization re: workup.  -echo with normal LV EF, no WMA -prefers medical management -she was having sinus tachycardia in the hospital with ambulation, with heart rates near 150. This has resolved.   Coronary calcium on CT: suggests she may have underlying CAD, recommend aggressive prevention -have discussed aspirin and statin. See prior notes  Hyperlipidemia: LDL 137. We discussed high intensity statins during her hospitalization. She has concerns re: myalgias -continue pravastatin 20 mg nightly per patient preference   Hypertension: at goal today -continue losartan 25 mg daily  Cardiac risk counseling and prevention recommendations: -recommend heart healthy/Mediterranean diet, with whole grains, fruits, vegetable, fish, lean meats, nuts, and olive oil. Limit salt. -recommend moderate walking, 3-5 times/week for 30-50 minutes each session. Aim for at least 150 minutes.week. Goal should be pace of 3 miles/hours, or walking 1.5 miles in 30 minutes -recommend avoidance of tobacco products. Avoid excess alcohol. -ASCVD risk score: The ASCVD Risk score Mikey Bussing DC Jr., et al., 2013) failed to calculate for the following reasons:   The 2013 ASCVD risk score is only valid for ages 94 to 70    Plan for follow up: 6 mos or sooner PRN  Medication Adjustments/Labs and Tests Ordered: Current medicines are reviewed at length with the patient today.  Concerns regarding medicines are outlined above.  Orders Placed This Encounter  Procedures   EKG 12-Lead   Meds ordered this encounter  Medications   pravastatin (PRAVACHOL) 20 MG tablet    Sig: Take 1 tablet (20 mg total) by mouth every evening.    Dispense:  90 tablet    Refill:  3   DISCONTD: losartan (COZAAR) 25 MG tablet    Sig: Take 1 tablet (25 mg total) by mouth daily.    Dispense:  90 tablet    Refill:  3    Patient Instructions  Medication Instructions:  Your Physician recommend you continue on your current  medication as directed.    *If you need a refill on your cardiac medications before your next appointment, please call your pharmacy*   Lab Work: None   Testing/Procedures: None   Follow-Up: At [facility], you and your health needs are our priority.  As part of our continuing mission to provide you with exceptional heart care, we have created designated Provider Care Teams.  These Care Teams include your primary Cardiologist (physician) and Advanced Practice Providers (APPs -  Physician Assistants and  Nurse Practitioners) who all work together to provide you with the care you need, when you need it.  We recommend signing up for the patient portal called "MyChart".  Sign up information is provided on this After Visit Summary.  MyChart is used to connect with patients for Virtual Visits (Telemedicine).  Patients are able to view lab/test results, encounter notes, upcoming appointments, etc.  Non-urgent messages can be sent to your provider as well.   To learn more about what you can do with MyChart, go to [email or web address].    Your next appointment:   6 month(s)  The format for your next appointment:   In Person  Provider:   Buford Dresser, MD    Signed, Buford Dresser, MD PhD 01/18/2020   [place]

## 2020-01-18 NOTE — Patient Instructions (Signed)

## 2020-01-23 ENCOUNTER — Telehealth (HOSPITAL_COMMUNITY): Payer: Self-pay

## 2020-01-23 NOTE — Telephone Encounter (Signed)
Per PR staff no response from pt.  Closed referral

## 2020-01-31 DIAGNOSIS — H532 Diplopia: Secondary | ICD-10-CM | POA: Diagnosis not present

## 2020-01-31 DIAGNOSIS — Z961 Presence of intraocular lens: Secondary | ICD-10-CM | POA: Diagnosis not present

## 2020-02-01 ENCOUNTER — Ambulatory Visit: Payer: Medicare Other | Admitting: Cardiology

## 2020-02-09 ENCOUNTER — Emergency Department (HOSPITAL_COMMUNITY): Payer: Medicare Other

## 2020-02-09 ENCOUNTER — Other Ambulatory Visit: Payer: Self-pay

## 2020-02-09 ENCOUNTER — Telehealth: Payer: Self-pay | Admitting: Adult Health

## 2020-02-09 ENCOUNTER — Inpatient Hospital Stay (HOSPITAL_COMMUNITY)
Admission: EM | Admit: 2020-02-09 | Discharge: 2020-02-15 | DRG: 189 | Disposition: A | Discharge status: Home Health | Payer: Medicare Other | Attending: Internal Medicine | Admitting: Internal Medicine

## 2020-02-09 DIAGNOSIS — R0902 Hypoxemia: Secondary | ICD-10-CM | POA: Diagnosis not present

## 2020-02-09 DIAGNOSIS — R42 Dizziness and giddiness: Secondary | ICD-10-CM | POA: Diagnosis not present

## 2020-02-09 DIAGNOSIS — Z880 Allergy status to penicillin: Secondary | ICD-10-CM | POA: Diagnosis not present

## 2020-02-09 DIAGNOSIS — Z20822 Contact with and (suspected) exposure to covid-19: Secondary | ICD-10-CM | POA: Diagnosis present

## 2020-02-09 DIAGNOSIS — E785 Hyperlipidemia, unspecified: Secondary | ICD-10-CM | POA: Diagnosis present

## 2020-02-09 DIAGNOSIS — J849 Interstitial pulmonary disease, unspecified: Secondary | ICD-10-CM | POA: Diagnosis present

## 2020-02-09 DIAGNOSIS — R05 Cough: Secondary | ICD-10-CM | POA: Diagnosis not present

## 2020-02-09 DIAGNOSIS — K5900 Constipation, unspecified: Secondary | ICD-10-CM

## 2020-02-09 DIAGNOSIS — Z9049 Acquired absence of other specified parts of digestive tract: Secondary | ICD-10-CM | POA: Diagnosis not present

## 2020-02-09 DIAGNOSIS — J9621 Acute and chronic respiratory failure with hypoxia: Secondary | ICD-10-CM | POA: Diagnosis present

## 2020-02-09 DIAGNOSIS — Z803 Family history of malignant neoplasm of breast: Secondary | ICD-10-CM

## 2020-02-09 DIAGNOSIS — Z7952 Long term (current) use of systemic steroids: Secondary | ICD-10-CM | POA: Diagnosis not present

## 2020-02-09 DIAGNOSIS — Z882 Allergy status to sulfonamides status: Secondary | ICD-10-CM

## 2020-02-09 DIAGNOSIS — Z888 Allergy status to other drugs, medicaments and biological substances status: Secondary | ICD-10-CM | POA: Diagnosis not present

## 2020-02-09 DIAGNOSIS — J189 Pneumonia, unspecified organism: Secondary | ICD-10-CM | POA: Diagnosis not present

## 2020-02-09 DIAGNOSIS — M810 Age-related osteoporosis without current pathological fracture: Secondary | ICD-10-CM | POA: Diagnosis present

## 2020-02-09 DIAGNOSIS — M069 Rheumatoid arthritis, unspecified: Secondary | ICD-10-CM | POA: Diagnosis present

## 2020-02-09 DIAGNOSIS — I10 Essential (primary) hypertension: Secondary | ICD-10-CM | POA: Diagnosis present

## 2020-02-09 DIAGNOSIS — Z825 Family history of asthma and other chronic lower respiratory diseases: Secondary | ICD-10-CM | POA: Diagnosis not present

## 2020-02-09 DIAGNOSIS — Z79899 Other long term (current) drug therapy: Secondary | ICD-10-CM

## 2020-02-09 DIAGNOSIS — I341 Nonrheumatic mitral (valve) prolapse: Secondary | ICD-10-CM | POA: Diagnosis present

## 2020-02-09 DIAGNOSIS — I499 Cardiac arrhythmia, unspecified: Secondary | ICD-10-CM | POA: Diagnosis not present

## 2020-02-09 DIAGNOSIS — K219 Gastro-esophageal reflux disease without esophagitis: Secondary | ICD-10-CM | POA: Diagnosis present

## 2020-02-09 DIAGNOSIS — Z8616 Personal history of COVID-19: Secondary | ICD-10-CM

## 2020-02-09 DIAGNOSIS — J45909 Unspecified asthma, uncomplicated: Secondary | ICD-10-CM | POA: Diagnosis present

## 2020-02-09 DIAGNOSIS — R0689 Other abnormalities of breathing: Secondary | ICD-10-CM | POA: Diagnosis not present

## 2020-02-09 DIAGNOSIS — R0602 Shortness of breath: Secondary | ICD-10-CM | POA: Diagnosis not present

## 2020-02-09 LAB — COMPREHENSIVE METABOLIC PANEL
ALT: 15 U/L (ref 0–44)
AST: 25 U/L (ref 15–41)
Albumin: 3.6 g/dL (ref 3.5–5.0)
Alkaline Phosphatase: 50 U/L (ref 38–126)
Anion gap: 13 (ref 5–15)
BUN: 12 mg/dL (ref 8–23)
CO2: 27 mmol/L (ref 22–32)
Calcium: 8.8 mg/dL — ABNORMAL LOW (ref 8.9–10.3)
Chloride: 98 mmol/L (ref 98–111)
Creatinine, Ser: 0.69 mg/dL (ref 0.44–1.00)
GFR calc Af Amer: >60 mL/min (ref >60)
GFR calc non Af Amer: >60 mL/min (ref >60)
Glucose, Bld: 86 mg/dL (ref 70–99)
Potassium: 3.9 mmol/L (ref 3.5–5.1)
Sodium: 138 mmol/L (ref 135–145)
Total Bilirubin: 0.6 mg/dL (ref 0.3–1.2)
Total Protein: 6.6 g/dL (ref 6.5–8.1)

## 2020-02-09 LAB — CBC WITH DIFFERENTIAL/PLATELET
Abs Immature Granulocytes: 0.16 10*3/uL — ABNORMAL HIGH (ref 0.00–0.07)
Basophils Absolute: 0.1 10*3/uL (ref 0.0–0.1)
Basophils Relative: 0 %
Eosinophils Absolute: 0.1 10*3/uL (ref 0.0–0.5)
Eosinophils Relative: 1 %
HCT: 42 % (ref 36.0–46.0)
Hemoglobin: 13.4 g/dL (ref 12.0–15.0)
Immature Granulocytes: 1 %
Lymphocytes Relative: 8 %
Lymphs Abs: 1.7 10*3/uL (ref 0.7–4.0)
MCH: 31 pg (ref 26.0–34.0)
MCHC: 31.9 g/dL (ref 30.0–36.0)
MCV: 97.2 fL (ref 80.0–100.0)
Monocytes Absolute: 2.1 10*3/uL — ABNORMAL HIGH (ref 0.1–1.0)
Monocytes Relative: 9 %
Neutro Abs: 17.8 10*3/uL — ABNORMAL HIGH (ref 1.7–7.7)
Neutrophils Relative %: 81 %
Platelets: 221 10*3/uL (ref 150–400)
RBC: 4.32 MIL/uL (ref 3.87–5.11)
RDW: 13.9 % (ref 11.5–15.5)
WBC: 21.9 10*3/uL — ABNORMAL HIGH (ref 4.0–10.5)
nRBC: 0 % (ref 0.0–0.2)

## 2020-02-09 LAB — BLOOD GAS, ARTERIAL
Acid-Base Excess: 1.7 mmol/L (ref 0.0–2.0)
Bicarbonate: 26.3 mmol/L (ref 20.0–28.0)
FIO2: 28
O2 Saturation: 93.4 %
Patient temperature: 98.6
pCO2 arterial: 43.3 mmHg (ref 32.0–48.0)
pH, Arterial: 7.4 (ref 7.350–7.450)
pO2, Arterial: 66.9 mmHg — ABNORMAL LOW (ref 83.0–108.0)

## 2020-02-09 LAB — SARS CORONAVIRUS 2 BY RT PCR (HOSPITAL ORDER, PERFORMED IN ~~LOC~~ HOSPITAL LAB): SARS Coronavirus 2: NEGATIVE

## 2020-02-09 MED ORDER — VANCOMYCIN HCL 750 MG/150ML IV SOLN
750.0000 mg | Freq: Two times a day (BID) | INTRAVENOUS | Status: DC
  Administered 2020-02-10 – 2020-02-11 (×3): 750 mg via INTRAVENOUS
  Filled 2020-02-09 (×5): qty 150

## 2020-02-09 MED ORDER — SODIUM CHLORIDE 0.9 % IV SOLN
2.0000 g | Freq: Three times a day (TID) | INTRAVENOUS | Status: DC
  Administered 2020-02-10 – 2020-02-12 (×7): 2 g via INTRAVENOUS
  Filled 2020-02-09 (×8): qty 2

## 2020-02-09 MED ORDER — METHYLPREDNISOLONE SODIUM SUCC 125 MG IJ SOLR
125.0000 mg | Freq: Once | INTRAMUSCULAR | Status: AC
  Administered 2020-02-09: 125 mg via INTRAVENOUS
  Filled 2020-02-09: qty 2

## 2020-02-09 MED ORDER — ONDANSETRON HCL 4 MG/2ML IJ SOLN: 4.0000 mg | Freq: Four times a day (QID) | INTRAMUSCULAR | Status: DC | PRN

## 2020-02-09 MED ORDER — ACETAMINOPHEN 650 MG RE SUPP: 650.0000 mg | Freq: Four times a day (QID) | RECTAL | Status: DC | PRN

## 2020-02-09 MED ORDER — SODIUM CHLORIDE 0.9 % IV SOLN: 2.0000 g | Freq: Once | INTRAVENOUS | Status: DC

## 2020-02-09 MED ORDER — GUAIFENESIN ER 600 MG PO TB12
600.0000 mg | ORAL_TABLET | Freq: Two times a day (BID) | ORAL | Status: DC
  Administered 2020-02-10 (×2): 600 mg via ORAL
  Filled 2020-02-09 (×2): qty 1

## 2020-02-09 MED ORDER — ACETAMINOPHEN 325 MG PO TABS
650.0000 mg | ORAL_TABLET | Freq: Once | ORAL | Status: AC
  Administered 2020-02-09: 650 mg via ORAL
  Filled 2020-02-09: qty 2

## 2020-02-09 MED ORDER — ACETAMINOPHEN 325 MG PO TABS: 650.0000 mg | ORAL_TABLET | Freq: Four times a day (QID) | ORAL | Status: DC | PRN

## 2020-02-09 MED ORDER — PIPERACILLIN-TAZOBACTAM 4.5 G IVPB
4.5000 g | Freq: Once | INTRAVENOUS | Status: DC
  Filled 2020-02-09: qty 100

## 2020-02-09 MED ORDER — IPRATROPIUM-ALBUTEROL 0.5-2.5 (3) MG/3ML IN SOLN: 3.0000 mL | Freq: Four times a day (QID) | RESPIRATORY_TRACT | Status: DC | PRN

## 2020-02-09 MED ORDER — PREDNISONE 20 MG PO TABS: 40.0000 mg | ORAL_TABLET | Freq: Every day | ORAL | Status: DC

## 2020-02-09 MED ORDER — ALBUTEROL SULFATE HFA 108 (90 BASE) MCG/ACT IN AERS
8.0000 | INHALATION_SPRAY | Freq: Once | RESPIRATORY_TRACT | Status: AC
  Administered 2020-02-09: 8 via RESPIRATORY_TRACT
  Filled 2020-02-09: qty 6.7

## 2020-02-09 MED ORDER — ENOXAPARIN SODIUM 40 MG/0.4ML ~~LOC~~ SOLN
40.0000 mg | SUBCUTANEOUS | Status: DC
  Administered 2020-02-10 – 2020-02-11 (×2): 40 mg via SUBCUTANEOUS
  Filled 2020-02-09 (×2): qty 0.4

## 2020-02-09 MED ORDER — VANCOMYCIN HCL 1500 MG/300ML IV SOLN
1500.0000 mg | Freq: Once | INTRAVENOUS | Status: AC
  Administered 2020-02-09: 1500 mg via INTRAVENOUS
  Filled 2020-02-09: qty 300

## 2020-02-09 NOTE — Progress Notes (Signed)
Pharmacy Antibiotic Note  DAILYNN [name] is a 79 y.o. female admitted on 02/09/2020 with cough, wheezing and having to use home O2 for a few days.  Pt has hx of covid last year and has chronic pulmonary issues..  Pharmacy has been consulted for vancomycin dosing for pneumonia.  Plan: Vancomycin 1500mg  IV x 1 then 750mg  IV q12h Follow renal function, cultures and clinical course     Temp (24hrs), Avg:100.1 F (37.8 C), Min:99.9 F (37.7 C), Max:100.3 F (37.9 C)  Recent Labs  Lab 02/09/20 1850  WBC 21.9*  CREATININE 0.69    CrCl cannot be calculated (Unknown ideal weight.).    Allergies  Allergen Reactions  . Penicillins Other (See Comments)    Reaction unknown occurred during childhood Has patient had a PCN reaction causing immediate rash, facial/tongue/throat swelling, SOB or lightheadedness with hypotension: Unknown Has patient had a PCN reaction causing severe rash involving mucus membranes or skin necrosis: Unknown Has patient had a PCN reaction that required hospitalization: No Has patient had a PCN reaction occurring within the last 10 years: No If all of the above answers are "NO", then may proceed with Cephalosporin use.  Unsur  . Remicade [Infliximab] Other (See Comments)    Reaction unknown  . Sulfa Antibiotics Other (See Comments)    Reaction unknown  . Sulfamethoxazole Other (See Comments)    Unsure of reaction As a child not sure     Antimicrobials this admission: 7/30 vanc 7/30 zosyn x 1 Dose adjustments this admission:   Microbiology results: 7/30 BCx:    Thank you for allowing pharmacy to be a part of this patient's care.  Dolly Rias RPh 02/09/2020, 9:09 PM

## 2020-02-09 NOTE — H&P (Addendum)
History and Physical    Stacey [name]:[id number] DOB: 09-28-1940 DOA: 02/09/2020  PCP: Shon Baton, MD Patient coming from: Home  Chief Complaint: Shortness of breath  HPI: Stacey [name] is a 79 y.o. 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  She normally uses 3 L supplemental oxygen at home only as needed.  For the past few days, she has required oxygen consistently.  Denies fevers.  She has also been vomiting since yesterday.  Denies abdominal pain or diarrhea.  She recently traveled to [place].  Denies any calf pain or swelling.  States she had Covid last year and was later vaccinated.  ED Course: Febrile with temperature 100.3 F.  Not tachycardic or hypotensive.  Oxygen saturation 85% on room air. ABG with pH 7.40, PCO2 43.3, and PO2 66.9.  Placed on 2 L supplemental oxygen.  Labs showing leukocytosis (WBC count 21.9).  SARS-CoV-2 PCR test negative.  Blood culture x2 pending.  Chest x-ray showing bibasilar hazy densities.  Patient received Tylenol, albuterol, Solu-Medrol 125 mg, vancomycin, and cefepime.  Review of Systems:  All systems reviewed and apart from history of presenting illness, are negative.  Past Medical History:  Diagnosis Date  . Abnormal finding of blood chemistry   . Asthma   . H/O measles   . H/O varicella   . Hypertension   . 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[place])   . 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 Lichen sclerosus 25/85/27   Asymptomatic  . Low iron   . Mitral valve prolapse   . Osteoarthritis   . Osteoporosis   . Pneumonia   . Post herpetic neuralgia   . Rheumatoid arthritis(714.0)   . Yeast infection     Past Surgical History:  Procedure Laterality Date    . CHOLECYSTECTOMY  2011  . WISDOM TOOTH EXTRACTION       reports that she has never smoked. She has never used smokeless tobacco. She reports that she does not drink alcohol and does not use drugs.  Allergies  Allergen Reactions  . Penicillins Other (See Comments)    Reaction unknown occurred during childhood Has patient had a PCN reaction causing immediate rash, facial/tongue/throat swelling, SOB or lightheadedness with hypotension: Unknown Has patient had a PCN reaction causing severe rash involving mucus membranes or skin necrosis: Unknown Has patient had a PCN reaction that required hospitalization: No Has patient had a PCN reaction occurring within the last 10 years: No If all of the above answers are "NO", then may proceed with Cephalosporin use.  Unsur  . Remicade [Infliximab] Other (See Comments)    Reaction unknown  . Sulfa Antibiotics Other (See Comments)    Reaction unknown  . Sulfamethoxazole Other (See Comments)    Unsure of reaction As a child not sure     Family History  Problem Relation Age of Onset  . Asthma Mother   . Anemia Mother   . Polymyalgia rheumatica Mother   . COPD Father   . Pulmonary fibrosis Father   . Breast cancer Maternal Grandmother 88    Prior to Admission medications   Medication Sig Start Date End Date Taking? Authorizing Provider  amitriptyline (ELAVIL) 25 MG tablet Take 25 mg by mouth at bedtime. 02/04/15   [provider]  beta carotene w/minerals (OCUVITE) tablet Take 1 tablet by mouth daily.    [provider]  cholecalciferol (VITAMIN D) 1000 UNITS tablet Take 1,000 Units by mouth daily.    [provider]  gabapentin (NEURONTIN) 300 MG capsule Take 600 mg by mouth at bedtime.    [provider]  losartan (COZAAR) 25 MG tablet Take 1 tablet (25 mg total) by mouth daily. 01/18/20   Buford Dresser, MD  methylPREDNISolone (MEDROL) 4 MG tablet Take 1 tablet (4 mg total) by mouth every other  day. Alternate with the 2mg  dose 06/22/19   Cherene Altes, MD  mometasone-formoterol (DULERA) 100-5 MCG/ACT AERO Inhale 2 puffs into the lungs 2 (two) times daily. 01/02/19   Fenton Foy, NP  Nintedanib (OFEV) 100 MG CAPS Take 1 capsule (100 mg total) by mouth 2 (two) times daily. 08/23/19   Brand Males, MD  pantoprazole (PROTONIX) 40 MG tablet Take 1 tablet (40 mg total) by mouth 2 (two) times daily before a meal. 05/09/19   Ollis, Brandi L, NP  polyethylene glycol (MIRALAX / GLYCOLAX) 17 g packet Take 17 g by mouth daily as needed for mild constipation.    [provider]  pravastatin (PRAVACHOL) 20 MG tablet Take 1 tablet (20 mg total) by mouth every evening. 01/18/20 01/12/21  Buford Dresser, MD  riTUXimab (RITUXAN) 100 MG/10ML injection Inject into the vein. Receives at [facility] but unsure of dose - no more doses for next 6 months as of 09/14/18    [provider]  venlafaxine XR (EFFEXOR-XR) 75 MG 24 hr capsule Take 75 mg by mouth at bedtime.     [provider]    Physical Exam: Vitals:   02/09/20 1742 02/09/20 1805 02/09/20 1807 02/09/20 2059  BP: (!) 104/59  (!) 162/57 (!) 109/56  Pulse: 93  98 79  Resp: 18  (!) 26 14  Temp: 99.9 F (37.7 C)  100.3 F (37.9 C)   TempSrc: Oral  Oral   SpO2: (!) 85% 91% 95% 92%    Physical Exam Constitutional:      General: She is not in acute distress. HENT:     Head: Normocephalic and atraumatic.     Mouth/Throat:     Mouth: Mucous membranes are moist.     Pharynx: Oropharynx is clear.  Eyes:     Extraocular Movements: Extraocular movements intact.     Conjunctiva/sclera: Conjunctivae normal.  Cardiovascular:     Rate and Rhythm: Normal rate and regular rhythm.     Pulses: Normal pulses.  Pulmonary:     Effort: Pulmonary effort is normal.     Comments: Satting 93 to 94% on 2 L supplemental oxygen Coarse bibasilar crackles No wheezing Abdominal:     General: Bowel sounds are normal. There is no  distension.     Palpations: Abdomen is soft.     Tenderness: There is no abdominal tenderness. There is no guarding.  Musculoskeletal:        General: No swelling or tenderness.     Cervical back: Normal range of motion and neck supple.  Skin:    General: Skin is warm and dry.  Neurological:     General: No focal deficit present.     Mental Status: She is alert and oriented to person, place, and time.     Labs on Admission: I have personally reviewed following labs and imaging studies  CBC: Recent Labs  Lab 02/09/20 1850  WBC 21.9*  NEUTROABS 17.8*  HGB 13.4  HCT 42.0  MCV 97.2  PLT 680   Basic Metabolic Panel: Recent Labs  Lab 02/09/20 1850  NA 138  K 3.9  CL 98  CO2 27  GLUCOSE 86  BUN 12  CREATININE 0.69  CALCIUM 8.8*   GFR: CrCl cannot be calculated (Unknown ideal weight.). Liver Function Tests: Recent Labs  Lab 02/09/20 1850  AST 25  ALT 15  ALKPHOS 50  BILITOT 0.6  PROT 6.6  ALBUMIN 3.6   No results for input(s): LIPASE, AMYLASE in the last 168 hours. No results for input(s): AMMONIA in the last 168 hours. Coagulation Profile: No results for input(s): INR, PROTIME in the last 168 hours. Cardiac Enzymes: No results for input(s): CKTOTAL, CKMB, CKMBINDEX, TROPONINI in the last 168 hours. BNP (last 3 results) No results for input(s): PROBNP in the last 8760 hours. HbA1C: No results for input(s): HGBA1C in the last 72 hours. CBG: No results for input(s): GLUCAP in the last 168 hours. Lipid Profile: No results for input(s): CHOL, HDL, LDLCALC, TRIG, CHOLHDL, LDLDIRECT in the last 72 hours. Thyroid Function Tests: No results for input(s): TSH, T4TOTAL, FREET4, T3FREE, THYROIDAB in the last 72 hours. Anemia Panel: No results for input(s): VITAMINB12, FOLATE, FERRITIN, TIBC, IRON, RETICCTPCT in the last 72 hours. Urine analysis:    Component Value Date/Time   COLORURINE YELLOW 12/18/2009 0120   APPEARANCEUR CLEAR 12/18/2009 0120   LABSPEC  1.006 12/18/2009 0120   PHURINE 7.0 12/18/2009 0120   GLUCOSEU NEGATIVE 12/18/2009 0120   HGBUR NEGATIVE 12/18/2009 0120   BILIRUBINUR NEGATIVE 12/18/2009 0120   KETONESUR NEGATIVE 12/18/2009 0120   PROTEINUR NEGATIVE 12/18/2009 0120   UROBILINOGEN 0.2 12/18/2009 0120   NITRITE NEGATIVE 12/18/2009 0120   LEUKOCYTESUR TRACE (A) 12/18/2009 0120    Radiological Exams on Admission: DG Chest 1 View  Result Date: 02/09/2020 CLINICAL DATA:  79 year old female with shortness of breath EXAM: CHEST  1 VIEW COMPARISON:  Chest radiograph dated 07/20/2019. FINDINGS: There is background of chronic interstitial coarsening. Bibasilar hazy densities concerning for atypical infection. Clinical correlation is recommended. Overall interval worsening of the right lung base density compared to the radiograph of 06/11/2019. Right apical subpleural calcification as seen on the CT of 07/03/2019. No large pleural effusion. There is no pneumothorax. The cardiac silhouette is within limits. Atherosclerotic calcification of the aorta. No acute osseous pathology. IMPRESSION: Bibasilar hazy densities concerning for atypical infection. Clinical correlation and follow-up is recommended. Electronically Signed   By: Anner Crete M.D.   On: 02/09/2020 19:12    EKG: Independently reviewed.  Sinus rhythm, no significant change since prior tracing.  Assessment/Plan Principal Problem:   Acute on chronic respiratory failure with hypoxia (HCC) Active Problems:   Rheumatoid arthritis (HCC)   Essential hypertension   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[place])   CAP (community acquired pneumonia)   Acute on chronic hypoxic respiratory failure secondary to pneumonia/ ILD exacerbation: Oxygen saturation 85% on room air and ABG with PO2 66.9.  Placed on 2 L supplemental oxygen currently satting 93 to 94%.  No increased work of breathing. Febrile with temperature 100.3 F.  Does have leukocytosis in the setting of chronic steroid use (WBC  count 21.9).  No tachycardia, hypotension, or other signs of sepsis.  Chest x-ray showing bibasilar hazy densities concerning for pneumonia.  SARS-CoV-2 PCR test negative. -Continue vancomycin and cefepime.  Blood culture x2 pending.  Check procalcitonin level.  Check lactic acid level. -Solu-Medrol 125 mg administered.  DuoNebs as needed.  Mucinex  for cough. -Continuous pulse ox, supplemental oxygen  Emesis: No complaints of abdominal pain and abdominal exam benign.  Does have fever and leukocytosis in the setting of pneumonia.  LFTs normal.  No active emesis since she has been in the ED. -Antiemetic as needed.  Check lipase level.  Hypertension: Currently normotensive. -Resume home meds after pharmacy med rec is done  Hyperlipidemia -Resume statin after pharmacy med rec is done  Rheumatoid arthritis -Resume home methylprednisolone after pharmacy med rec is done  GERD -Resume PPI after pharmacy med rec is done  DVT prophylaxis: Lovenox Code Status: Full code-confirmed with the patient Family Communication: No family available at this time. Disposition Plan: Status is: Inpatient  Remains inpatient appropriate because:IV treatments appropriate due to intensity of illness or inability to take PO and Inpatient level of care appropriate due to severity of illness   Dispo: The patient is from: Home              Anticipated d/c is to: Home              Anticipated d/c date is: 2 days              Patient currently is not medically stable to d/c.  The medical decision making on this patient was of high complexity and the patient is at high risk for clinical deterioration, therefore this is a level 3 visit.  Shela Leff MD [facility]  If 7PM-7AM, please contact night-coverage [email or web address]  02/09/2020, 10:08 PM

## 2020-02-09 NOTE — Progress Notes (Signed)
Pharmacy Antibiotic Note  Stacey [name] is a 79 y.o. female admitted on 02/09/2020 with cough, wheezing and having to use home O2 for a few days.  Pt has hx of covid last year and has chronic pulmonary issues..  Pharmacy has been consulted for vancomycin dosing for pneumonia. Now adding cefepime to vancomycin per Rx dosing  Plan: Continue current vanc dosing Cefepime 2g IV q8 Follow renal function, cultures and clinical course     Temp (24hrs), Avg:100.1 F (37.8 C), Min:99.9 F (37.7 C), Max:100.3 F (37.9 C)  Recent Labs  Lab 02/09/20 1850  WBC 21.9*  CREATININE 0.69    CrCl cannot be calculated (Unknown ideal weight.).    Allergies  Allergen Reactions  . Penicillins Other (See Comments)    Reaction unknown occurred during childhood Has patient had a PCN reaction causing immediate rash, facial/tongue/throat swelling, SOB or lightheadedness with hypotension: Unknown Has patient had a PCN reaction causing severe rash involving mucus membranes or skin necrosis: Unknown Has patient had a PCN reaction that required hospitalization: No Has patient had a PCN reaction occurring within the last 10 years: No If all of the above answers are "NO", then may proceed with Cephalosporin use.  Unsur  . Remicade [Infliximab] Other (See Comments)    Reaction unknown  . Sulfa Antibiotics Other (See Comments)    Reaction unknown  . Sulfamethoxazole Other (See Comments)    Unsure of reaction As a child not sure        Thank you for allowing pharmacy to be a part of this patient's care.  Adrian Saran, PharmD, BCPS 02/09/2020 10:13 PM

## 2020-02-09 NOTE — ED Provider Notes (Signed)
[facility] DEPT Provider Note   CSN: [id number] Arrival date & time: 02/09/20  1729     History Chief Complaint  Patient presents with  . Cough  . Wheezing    TAKERRA LUPINACCI is a 79 y.o. female.  Blaize Nipper is an 79 y/o F with a PMHx of interstitial lung disease who presents to the ED with dry cough, worsening shortness of breath, and feeling foggy-headedness that started yesterday. Patient states she has oxygen at home that she uses when she becomes short of breath. She states she was around her nephew that was diagnosed with COVID last week, however, she states they were outside of their two week quarantine period. She also notes she traveled for a long car ride 10 days ago. She denies noticing any calf pain, redness, or swelling.   She denies fever, chest pain, back pain, abdominal pain, nausea, vomiting, diarrhea, or lower leg swelling, redness.   The history is provided by the patient.  Shortness of Breath Severity:  Moderate Onset quality:  Sudden Timing:  Constant Progression:  Worsening Chronicity:  Recurrent Associated symptoms: cough   Associated symptoms: no abdominal pain, no chest pain and no fever        Past Medical History:  Diagnosis Date  . Abnormal finding of blood chemistry   . Asthma   . H/O measles   . H/O varicella   . Hypertension   . 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[place])   . 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 Lichen sclerosus 76/28/31   Asymptomatic  . Low iron   . Mitral valve prolapse   . Osteoarthritis   . Osteoporosis   . Pneumonia   . Post herpetic neuralgia   . Rheumatoid arthritis(714.0)   . Yeast infection     Patient Active Problem List   Diagnosis Date Noted  . Acute on chronic respiratory failure with hypoxia ([place]) 07/06/2019  . Fall at home, initial encounter 07/06/2019  . Orthostatic hypotension 07/06/2019  . Rib fractures 07/04/2019  . Pneumonia due to COVID-19 virus 06/11/2019  .  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[place]) 05/07/2019  . Acute asthma exacerbation 12/18/2018  . Elevated troponin 12/18/2018  . Acute bronchitis 08/30/2018  . Chronic respiratory failure with hypoxia ([place]) 04/06/2018  . Essential hypertension 03/24/2018  . Depression 03/24/2018  . Asthma exacerbation 03/23/2018  . DOE (dyspnea on exertion) 03/21/2018  . 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 esophagitis 03/21/2018  . Chronic lung disease   . Hypoxia   . Dyspnea 01/23/2018  . ILD (interstitial lung disease) ([place]) 01/23/2018  . 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 Multiple pulmonary nodules/RA lung dz  05/29/2013  . Cough variant asthma vs uacs  05/29/2013  . Rheumatoid arthritis ([place]) 01/13/2012  . Osteopenia 01/13/2012  . Lichen sclerosus et atrophicus of the vulva 01/13/2012  . Asthma 01/13/2012    Past Surgical History:  Procedure Laterality Date  . CHOLECYSTECTOMY  2011  . WISDOM TOOTH EXTRACTION       OB History    Gravida  2   Para  2   Term  2   Preterm      AB      Living  2     SAB      TAB      Ectopic      Multiple      Live Births  2           Family History  Problem Relation  Age of Onset  . Asthma Mother   . Anemia Mother   . Polymyalgia rheumatica Mother   . COPD Father   . Pulmonary fibrosis Father   . Breast cancer Maternal Grandmother 88    Social History   Tobacco Use  . Smoking status: Never Smoker  . Smokeless tobacco: Never Used  Vaping Use  . Vaping Use: Never used  Substance Use Topics  . Alcohol use: No  . Drug use: No    Home Medications Prior to Admission medications   Medication Sig Start Date End Date Taking? Authorizing Provider  amitriptyline (ELAVIL) 25 MG tablet Take 25 mg by mouth at bedtime. 02/04/15   [provider]  beta carotene w/minerals (OCUVITE) tablet Take 1 tablet by mouth daily.    [provider]  cholecalciferol (VITAMIN D) 1000 UNITS tablet Take 1,000 Units by  mouth daily.    [provider]  gabapentin (NEURONTIN) 300 MG capsule Take 600 mg by mouth at bedtime.    [provider]  losartan (COZAAR) 25 MG tablet Take 1 tablet (25 mg total) by mouth daily. 01/18/20   Buford Dresser, MD  methylPREDNISolone (MEDROL) 4 MG tablet Take 1 tablet (4 mg total) by mouth every other day. Alternate with the 2mg  dose 06/22/19   Cherene Altes, MD  mometasone-formoterol (DULERA) 100-5 MCG/ACT AERO Inhale 2 puffs into the lungs 2 (two) times daily. 01/02/19   Fenton Foy, NP  Nintedanib (OFEV) 100 MG CAPS Take 1 capsule (100 mg total) by mouth 2 (two) times daily. 08/23/19   Brand Males, MD  pantoprazole (PROTONIX) 40 MG tablet Take 1 tablet (40 mg total) by mouth 2 (two) times daily before a meal. 05/09/19   Ollis, Brandi L, NP  polyethylene glycol (MIRALAX / GLYCOLAX) 17 g packet Take 17 g by mouth daily as needed for mild constipation.    [provider]  pravastatin (PRAVACHOL) 20 MG tablet Take 1 tablet (20 mg total) by mouth every evening. 01/18/20 01/12/21  Buford Dresser, MD  riTUXimab (RITUXAN) 100 MG/10ML injection Inject into the vein. Receives at [facility] but unsure of dose - no more doses for next 6 months as of 09/14/18    [provider]  venlafaxine XR (EFFEXOR-XR) 75 MG 24 hr capsule Take 75 mg by mouth at bedtime.     [provider]    Allergies    Penicillins, Remicade [infliximab], Sulfa antibiotics, and Sulfamethoxazole  Review of Systems   Review of Systems  Constitutional: Negative for fever.  Respiratory: Positive for cough and shortness of breath.   Cardiovascular: Negative for chest pain.  Gastrointestinal: Negative for abdominal pain, constipation, diarrhea and nausea.  Musculoskeletal: Negative for back pain.  All other systems reviewed and are negative.   Physical Exam Updated Vital Signs BP (!) 162/57 (BP Location: Right Arm)   Pulse 98   Temp 100.3 F (37.9 C)  (Oral)   Resp (!) 26   SpO2 95%   Physical Exam Vitals and nursing note reviewed.  Constitutional:      General: She is in acute distress.     Appearance: She is not toxic-appearing.  HENT:     Mouth/Throat:     Mouth: Mucous membranes are moist.  Eyes:     Extraocular Movements: Extraocular movements intact.  Cardiovascular:     Rate and Rhythm: Regular rhythm. Tachycardia present.     Heart sounds: No murmur heard.   Pulmonary:     Effort: Respiratory  distress present.     Breath sounds: Stridor present. Wheezing present. No rhonchi or rales.  Abdominal:     General: Abdomen is flat.     Palpations: Abdomen is soft.     Tenderness: There is no abdominal tenderness. There is no guarding or rebound.  Musculoskeletal:     Cervical back: Normal range of motion and neck supple.     Right lower leg: No edema.     Left lower leg: No edema.  Skin:    General: Skin is warm and dry.  Neurological:     General: No focal deficit present.     Mental Status: She is alert and oriented to person, place, and time.  Psychiatric:        Behavior: Behavior normal.     ED Results / Procedures / Treatments   Labs (all labs ordered are listed, but only abnormal results are displayed) Labs Reviewed  BLOOD GAS, ARTERIAL - Abnormal; Notable for the following components:      Result Value   pO2, Arterial 66.9 (*)    All other components within normal limits  SARS CORONAVIRUS 2 BY RT PCR [facility] ORDER, [place] LAB)  CBC WITH DIFFERENTIAL/PLATELET  COMPREHENSIVE METABOLIC PANEL    EKG EKG Interpretation  Date/Time:  Friday February 09 2020 18:47:28 EDT Ventricular Rate:  95 PR Interval:    QRS Duration: 97 QT Interval:  340 QTC Calculation: 428 R Axis:   8 Text Interpretation: Sinus rhythm No significant change since last tracing Confirmed by Deno Etienne [phone]) on 02/09/2020 7:09:11 PM   Radiology DG Chest 1 View  Result Date: 02/09/2020 CLINICAL DATA:   79 year old female with shortness of breath EXAM: CHEST  1 VIEW COMPARISON:  Chest radiograph dated 07/20/2019. FINDINGS: There is background of chronic interstitial coarsening. Bibasilar hazy densities concerning for atypical infection. Clinical correlation is recommended. Overall interval worsening of the right lung base density compared to the radiograph of 06/11/2019. Right apical subpleural calcification as seen on the CT of 07/03/2019. No large pleural effusion. There is no pneumothorax. The cardiac silhouette is within limits. Atherosclerotic calcification of the aorta. No acute osseous pathology. IMPRESSION: Bibasilar hazy densities concerning for atypical infection. Clinical correlation and follow-up is recommended. Electronically Signed   By: Anner Crete M.D.   On: 02/09/2020 19:12    Procedures Procedures (including critical care time)  Medications Ordered in ED Medications  albuterol (VENTOLIN HFA) 108 (90 Base) MCG/ACT inhaler 8 puff (8 puffs Inhalation Given 02/09/20 1847)  methylPREDNISolone sodium succinate (SOLU-MEDROL) 125 mg/2 mL injection 125 mg (125 mg Intravenous Given 02/09/20 1851)  acetaminophen (TYLENOL) tablet 650 mg (650 mg Oral Given 02/09/20 1900)    ED Course  I have reviewed the triage vital signs and the nursing notes.  Pertinent labs & imaging results that were available during my care of the patient were reviewed by me and considered in my medical decision making (see chart for details).    MDM Rules/Calculators/A&P                          Audyn Dimercurio is an 79 y/o F with a PMHx of interstitial lung disease who presents today with confusion, shortness of breath, and dry cough for the past day. Patient has had exposure to a nephew who tested postivie for COVID. She also has been traveling recently but denies lower extremity pain or swelling.   Upon physical exam patient is  tachycardic and has decreased breath sounds bilaterally as well as stridor and  expiratory wheezing.   CBC WBC of 21.9 CMP, unremarkable. Blood Gas PO2 of 66.9.  COVID Test - Negative Chest X-ray - Bibasilar hazy densities concerning for atypical infection EKG - No acute changes when compared to prior EKG  Suspected community acquired pneumonia based on patient's history, physical exam, elevated white count, and chest x-ray findings. Asked patient about her penicillin allergy and she states since being a child she was told she had a penicillin allergy, but is unsure what happens.    Will start Vanc and Cefepime. Admitted to hospitalist service.   Final Clinical Impression(s) / ED Diagnoses Final diagnoses:  None    Rx / DC Orders ED Discharge Orders    None       Riesa Pope, MD 02/09/20 2111    Deno Etienne, DO 02/09/20 2256

## 2020-02-09 NOTE — ED Triage Notes (Signed)
Per EMS- patient is from home. Patient c/o cough, wheezing and having to use home O2 for a few days. Patient has been traveling to [place] and had guests from [place] recently. patient has a history of Covid last year and has chronic pulmonary issues.  Patient states she vomited x 1 and was given Zofran by EMs prior to arrival to the ED.

## 2020-02-09 NOTE — Telephone Encounter (Signed)
Spoke with the pt's spouse and notified of response per Dr. Chase Caller. He is going to call EMS since she is so weak.

## 2020-02-09 NOTE — Telephone Encounter (Signed)
She might not have covid because she has had covid before and vaccine. bAsed on hypoxemia and prior admits for resp issues - It hink she needs to go to ER esp this is Friday evening

## 2020-02-09 NOTE — Telephone Encounter (Signed)
Spoke with the pt's spouse  He states that they took trip to [place] 2 wks ago  They stayed gone 9 days  When returned they both had sore throat   Family visited recently from [place]- one of them recovering from case of Covid 19  Pt has had both vaccines Today she has had to use o2 all day and feels very tired, increased SOB She is coughing more, but not bringing up any sputum  She denies any fever, chills, or body aches  He is unsure of sats b/c he is not checking her sats  I asked him to check sats while on the phone and she is at 95% 3lpm  She feels bad in general  Spouse unsure what to do  Please advise thanks

## 2020-02-10 ENCOUNTER — Encounter (HOSPITAL_COMMUNITY): Payer: Self-pay | Admitting: Internal Medicine

## 2020-02-10 LAB — PROCALCITONIN: Procalcitonin: 2.44 ng/mL

## 2020-02-10 LAB — LACTIC ACID, PLASMA: Lactic Acid, Venous: 1.2 mmol/L (ref 0.5–1.9)

## 2020-02-10 LAB — LIPASE, BLOOD: Lipase: 21 U/L (ref 11–51)

## 2020-02-10 LAB — MRSA PCR SCREENING: MRSA by PCR: NEGATIVE

## 2020-02-10 MED ORDER — HYDROCOD POLST-CPM POLST ER 10-8 MG/5ML PO SUER
5.0000 mL | Freq: Two times a day (BID) | ORAL | Status: DC | PRN
  Administered 2020-02-10 – 2020-02-11 (×3): 5 mL via ORAL
  Filled 2020-02-10 (×3): qty 5

## 2020-02-10 MED ORDER — DM-GUAIFENESIN ER 30-600 MG PO TB12
1.0000 | ORAL_TABLET | Freq: Two times a day (BID) | ORAL | Status: DC
  Administered 2020-02-10 – 2020-02-11 (×3): 1 via ORAL
  Filled 2020-02-10 (×3): qty 1

## 2020-02-10 MED ORDER — VENLAFAXINE HCL ER 75 MG PO CP24
75.0000 mg | ORAL_CAPSULE | Freq: Every day | ORAL | Status: DC
  Administered 2020-02-11 – 2020-02-14 (×4): 75 mg via ORAL
  Filled 2020-02-10 (×7): qty 1

## 2020-02-10 MED ORDER — GABAPENTIN 400 MG PO CAPS
400.0000 mg | ORAL_CAPSULE | Freq: Every day | ORAL | Status: DC
  Administered 2020-02-10 – 2020-02-14 (×5): 400 mg via ORAL
  Filled 2020-02-10 (×5): qty 1

## 2020-02-10 MED ORDER — PREDNISONE 50 MG PO TABS
50.0000 mg | ORAL_TABLET | Freq: Every day | ORAL | Status: DC
  Administered 2020-02-10 – 2020-02-15 (×6): 50 mg via ORAL
  Filled 2020-02-10 (×7): qty 1

## 2020-02-10 MED ORDER — BENZONATATE 100 MG PO CAPS
100.0000 mg | ORAL_CAPSULE | Freq: Three times a day (TID) | ORAL | Status: DC
  Administered 2020-02-10 – 2020-02-15 (×15): 100 mg via ORAL
  Filled 2020-02-10 (×15): qty 1

## 2020-02-10 MED ORDER — AMITRIPTYLINE HCL 25 MG PO TABS
25.0000 mg | ORAL_TABLET | Freq: Every day | ORAL | Status: DC
  Administered 2020-02-10 – 2020-02-14 (×5): 25 mg via ORAL
  Filled 2020-02-10 (×5): qty 1

## 2020-02-10 MED ORDER — PANTOPRAZOLE SODIUM 40 MG PO TBEC
40.0000 mg | DELAYED_RELEASE_TABLET | Freq: Two times a day (BID) | ORAL | Status: DC
  Administered 2020-02-10 – 2020-02-15 (×11): 40 mg via ORAL
  Filled 2020-02-10 (×11): qty 1

## 2020-02-10 NOTE — Progress Notes (Signed)
[facility] Progress Note  Patient: Stacey [name]    ZOX:[id number]  DOA: 02/09/2020     Date of Service: the patient was seen and examined on 02/10/2020  Brief hospital course: Stacey [name] is a 79 y.o. 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  Currently plan is continue current treatment.  Assessment and Plan: Acute on chronic hypoxic respiratory failure secondary to pneumonia/ ILD exacerbation: Oxygen saturation 85% on room air and ABG with PO2 66.9.  Placed on 2 L supplemental oxygen currently satting 93 to 94%.  No increased work of breathing. Febrile with temperature 100.3 F.  Does have leukocytosis in the setting of chronic steroid use (WBC count 21.9).  No tachycardia, hypotension, or other signs of sepsis.  Chest x-ray showing bibasilar hazy densities concerning for pneumonia.  SARS-CoV-2 PCR test negative. -Continue vancomycin and cefepime.  Blood culture x2 pending.    Elevated procalcitonin level.    Normal lactic acid level. -Solu-Medrol 125 mg administered.  DuoNebs as needed.  Mucinex for cough. -Continuous pulse ox, supplemental oxygen  Emesis: No complaints of abdominal pain and abdominal exam benign.  Does have fever and leukocytosis in the setting of pneumonia.  LFTs normal.  No active emesis since she has been in the ED. -Antiemetic as needed.    Normal lipase level.  Hypertension: Currently normotensive. -Resume home meds after pharmacy med rec is done  Hyperlipidemia -Resume statin after pharmacy med rec is done  Rheumatoid arthritis -Patient on chronic steroids.  Would add scheduled steroids.  GERD -Resume PPI after pharmacy med rec is done  Diet: Regular  diet DVT Prophylaxis:  enoxaparin (LOVENOX) injection 40 mg Start: 02/10/20 0800  Advance goals of care discussion: Full code  Family Communication: no family was present at bedside, at the time of interview.   Disposition:  Status is: Inpatient  Remains inpatient appropriate because:Ongoing diagnostic testing needed not appropriate for outpatient work up  Dispo: The patient is from: Home              Anticipated d/c is to: Home              Anticipated d/c date is: 2 days              Patient currently is not medically stable to d/c.  Subjective: Continues to have cough and shortness of breath.  Also fatigue and tiredness.  No nausea no vomiting.  Physical Exam:  General: Appear in mild distress, no Rash; Oral Mucosa Clear, moist. no Abnormal Neck Mass Or lumps, Conjunctiva normal  Cardiovascular: S1 and S2 Present, no Murmur, Respiratory: increased respiratory effort, Bilateral Air entry present and bilateral  Crackles, no wheezes Abdomen: Bowel Sound present, Soft and no tenderness Extremities: no Pedal edema, no calf tenderness Neurology: alert and oriented to time, place, and person affect appropriate. no new focal deficit Gait not checked due to patient safety concerns  Vitals:   02/10/20 1500 02/10/20 1601 02/10/20 1751 02/10/20 1800  BP: (!) 135/77 (!) 148/70 (!) 139/99 (!) 139/82  Pulse: 71 57 76 73  Resp: 19 23 22 22   Temp:      TempSrc:      SpO2: 97% 96% 94% 94%    Intake/Output Summary (Last 24 hours) at  02/10/2020 1849 Last data filed at 02/10/2020 1731 Gross per 24 hour  Intake 549.78 ml  Output 900 ml  Net -350.22 ml   There were no vitals filed for this visit.  Data Reviewed: I have personally reviewed and interpreted daily labs, tele strips, imagings as discussed above. I reviewed all nursing notes, pharmacy notes, vitals, pertinent old records I have discussed plan of care as described above with RN and patient/family.  CBC: Recent Labs  Lab  02/09/20 1850  WBC 21.9*  NEUTROABS 17.8*  HGB 13.4  HCT 42.0  MCV 97.2  PLT 919   Basic Metabolic Panel: Recent Labs  Lab 02/09/20 1850  NA 138  K 3.9  CL 98  CO2 27  GLUCOSE 86  BUN 12  CREATININE 0.69  CALCIUM 8.8*    Studies: No results found.  Scheduled Meds: . amitriptyline  25 mg Oral QHS  . benzonatate  100 mg Oral TID  . dextromethorphan-guaiFENesin  1 tablet Oral BID  . enoxaparin (LOVENOX) injection  40 mg Subcutaneous Q24H  . gabapentin  400 mg Oral QHS  . pantoprazole  40 mg Oral BID AC  . predniSONE  50 mg Oral Q breakfast  . [START ON 02/11/2020] venlafaxine XR  75 mg Oral Q breakfast   Continuous Infusions: . ceFEPime (MAXIPIME) IV Stopped (02/10/20 1730)  . vancomycin Stopped (02/10/20 1238)   PRN Meds: acetaminophen **OR** acetaminophen, chlorpheniramine-HYDROcodone, ipratropium-albuterol, ondansetron (ZOFRAN) IV  Time spent: 35 minutes  Author: Berle Mull, MD [facility] 02/10/2020 6:49 PM  To reach On-call, see care teams to locate the attending and reach out via [email or web address]. Between 7PM-7AM, please contact night-coverage If you still have difficulty reaching the attending provider, please page the [facility] (Director on Call) for [facility] on amion for assistance.

## 2020-02-10 NOTE — ED Notes (Signed)
Pt called out because her gown, chuck, and blanket were wet.  This writer readjusted the [place] and turned up the suction.  This Probation officer also changed the pt's gown, blanket and removed the chuck.

## 2020-02-11 LAB — CBC WITH DIFFERENTIAL/PLATELET
Abs Immature Granulocytes: 0.12 10*3/uL — ABNORMAL HIGH (ref 0.00–0.07)
Basophils Absolute: 0 10*3/uL (ref 0.0–0.1)
Basophils Relative: 0 %
Eosinophils Absolute: 0 10*3/uL (ref 0.0–0.5)
Eosinophils Relative: 0 %
HCT: 36.6 % (ref 36.0–46.0)
Hemoglobin: 11.5 g/dL — ABNORMAL LOW (ref 12.0–15.0)
Immature Granulocytes: 1 %
Lymphocytes Relative: 8 %
Lymphs Abs: 1.8 10*3/uL (ref 0.7–4.0)
MCH: 30.8 pg (ref 26.0–34.0)
MCHC: 31.4 g/dL (ref 30.0–36.0)
MCV: 98.1 fL (ref 80.0–100.0)
Monocytes Absolute: 2.1 10*3/uL — ABNORMAL HIGH (ref 0.1–1.0)
Monocytes Relative: 10 %
Neutro Abs: 17.7 10*3/uL — ABNORMAL HIGH (ref 1.7–7.7)
Neutrophils Relative %: 81 %
Platelets: 204 10*3/uL (ref 150–400)
RBC: 3.73 MIL/uL — ABNORMAL LOW (ref 3.87–5.11)
RDW: 14 % (ref 11.5–15.5)
WBC: 21.7 10*3/uL — ABNORMAL HIGH (ref 4.0–10.5)
nRBC: 0 % (ref 0.0–0.2)

## 2020-02-11 LAB — BASIC METABOLIC PANEL
Anion gap: 8 (ref 5–15)
BUN: 16 mg/dL (ref 8–23)
CO2: 25 mmol/L (ref 22–32)
Calcium: 8.5 mg/dL — ABNORMAL LOW (ref 8.9–10.3)
Chloride: 106 mmol/L (ref 98–111)
Creatinine, Ser: 0.62 mg/dL (ref 0.44–1.00)
GFR calc Af Amer: >60 mL/min (ref >60)
GFR calc non Af Amer: >60 mL/min (ref >60)
Glucose, Bld: 96 mg/dL (ref 70–99)
Potassium: 4.7 mmol/L (ref 3.5–5.1)
Sodium: 139 mmol/L (ref 135–145)

## 2020-02-11 MED ORDER — AZITHROMYCIN 250 MG PO TABS
500.0000 mg | ORAL_TABLET | Freq: Every day | ORAL | Status: DC
  Administered 2020-02-11 – 2020-02-15 (×5): 500 mg via ORAL
  Filled 2020-02-11 (×5): qty 2

## 2020-02-11 MED ORDER — LOSARTAN POTASSIUM 25 MG PO TABS
25.0000 mg | ORAL_TABLET | Freq: Every day | ORAL | Status: DC
  Administered 2020-02-12 – 2020-02-15 (×4): 25 mg via ORAL
  Filled 2020-02-11 (×4): qty 1

## 2020-02-11 MED ORDER — MELATONIN 5 MG PO TABS
5.0000 mg | ORAL_TABLET | Freq: Every day | ORAL | Status: DC
  Administered 2020-02-11 – 2020-02-14 (×5): 5 mg via ORAL
  Filled 2020-02-11 (×4): qty 1

## 2020-02-11 MED ORDER — HYDROCOD POLST-CPM POLST ER 10-8 MG/5ML PO SUER
5.0000 mL | Freq: Two times a day (BID) | ORAL | Status: DC
  Administered 2020-02-11: 5 mL via ORAL
  Filled 2020-02-11: qty 5

## 2020-02-11 NOTE — Progress Notes (Signed)
[facility] Progress Note  Patient: Stacey [name]    DDU:[id number]  DOA: 02/09/2020     Date of Service: the patient was seen and examined on 02/11/2020  Brief hospital course: Stacey [name] is a 79 y.o. 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  Currently plan is continue current treatment.  Assessment and Plan: Acute on chronic hypoxic respiratory failure secondary to pneumonia/ ILD exacerbation: Oxygen saturation 85% on room air and ABG with PO2 66.9.  Placed on 2 L supplemental oxygen currently satting 93 to 94%.  No increased work of breathing. Febrile with temperature 100.3 F.  Does have leukocytosis in the setting of chronic steroid use (WBC count 21.9).  No tachycardia, hypotension, or other signs of sepsis.  Chest x-ray showing bibasilar hazy densities concerning for pneumonia.  SARS-CoV-2 PCR test negative. -Continue vancomycin and cefepime.  Blood culture x2 so far no growth.    Elevated procalcitonin level.    Normal lactic acid level. -Solu-Medrol 125 mg administered.    Now on oral prednisone.  DuoNebs as needed.  Mucinex for cough. -Continuous pulse ox, supplemental oxygen MRSA PCR negative.  Discontinue vancomycin.  Oral azithromycin.  Emesis: No complaints of abdominal pain and abdominal exam benign.  Does have fever and leukocytosis in the setting of pneumonia.  LFTs normal.  No active emesis since she has been in the ED. -Antiemetic as needed.    Normal lipase level.  Hypertension: Currently normotensive. -Resume home meds after pharmacy med rec is done  Hyperlipidemia -Resume statin after pharmacy med rec is done  Rheumatoid arthritis -Patient on chronic steroids.   Would add scheduled steroids.  GERD -Resume PPI after pharmacy med rec is done  Diet: Regular diet DVT Prophylaxis:  enoxaparin (LOVENOX) injection 40 mg Start: 02/10/20 0800  Advance goals of care discussion: Full code  Family Communication: no family was present at bedside, at the time of interview.   Disposition:  Status is: Inpatient  Remains inpatient appropriate because:Ongoing diagnostic testing needed not appropriate for outpatient work up  Dispo: The patient is from: Home              Anticipated d/c is to: Home              Anticipated d/c date is: 2 days              Patient currently is not medically stable to d/c.  Subjective: No nausea no vomiting no fever no chills.  No chest pain abdomen.  Physical Exam:  General: Appear in mild distress, no Rash; Oral Mucosa Clear, moist. no Abnormal Neck Mass Or lumps, Conjunctiva normal  Cardiovascular: S1 and S2 Present, no Murmur, Respiratory: increased respiratory effort, Bilateral Air entry present and bilateral  Crackles, no wheezes Abdomen: Bowel Sound present, Soft and no tenderness Extremities: no Pedal edema, no calf tenderness Neurology: alert and oriented to time, place, and person affect appropriate. no new focal deficit Gait not checked due to patient safety concerns  Vitals:   02/11/20 0904 02/11/20 1129 02/11/20 1337 02/11/20 1421  BP: (!) 144/68 (!) 143/60 (!) 127/87 (!) 144/63  Pulse: 71 71 67 70  Resp: 19 18 (!) 24 16  Temp:   (!) 97.5 F (36.4 C) 98 F (36.7  C)  TempSrc:   Oral Oral  SpO2: 95% (!) 89% 93% 100%  Weight:    74.1 kg  Height:    5' 7.99" (1.727 m)    Intake/Output Summary (Last 24 hours) at 02/11/2020 1720 Last data filed at 02/11/2020 1515 Gross per 24 hour  Intake 370 ml  Output 2300 ml  Net -1930 ml   Filed Weights   02/11/20 1421  Weight: 74.1 kg    Data Reviewed: I have personally reviewed and interpreted daily labs, tele strips, imagings as discussed above. I reviewed  all nursing notes, pharmacy notes, vitals, pertinent old records I have discussed plan of care as described above with RN and patient/family.  CBC: Recent Labs  Lab 02/09/20 1850 02/11/20 0750  WBC 21.9* 21.7*  NEUTROABS 17.8* 17.7*  HGB 13.4 11.5*  HCT 42.0 36.6  MCV 97.2 98.1  PLT 221 130   Basic Metabolic Panel: Recent Labs  Lab 02/09/20 1850 02/11/20 0750  NA 138 139  K 3.9 4.7  CL 98 106  CO2 27 25  GLUCOSE 86 96  BUN 12 16  CREATININE 0.69 0.62  CALCIUM 8.8* 8.5*    Studies: No results found.  Scheduled Meds: . amitriptyline  25 mg Oral QHS  . azithromycin  500 mg Oral Daily  . benzonatate  100 mg Oral TID  . chlorpheniramine-HYDROcodone  5 mL Oral Q12H  . dextromethorphan-guaiFENesin  1 tablet Oral BID  . enoxaparin (LOVENOX) injection  40 mg Subcutaneous Q24H  . gabapentin  400 mg Oral QHS  . melatonin  5 mg Oral QHS  . pantoprazole  40 mg Oral BID AC  . predniSONE  50 mg Oral Q breakfast  . venlafaxine XR  75 mg Oral Q breakfast   Continuous Infusions: . ceFEPime (MAXIPIME) IV 2 g (02/11/20 1651)   PRN Meds: acetaminophen **OR** acetaminophen, ipratropium-albuterol, ondansetron (ZOFRAN) IV  Time spent: 35 minutes  Author: Berle Mull, MD [facility] 02/11/2020 5:20 PM  To reach On-call, see care teams to locate the attending and reach out via [email or web address]. Between 7PM-7AM, please contact night-coverage If you still have difficulty reaching the attending provider, please page the [facility] (Director on Call) for [facility] on amion for assistance.

## 2020-02-12 ENCOUNTER — Inpatient Hospital Stay (HOSPITAL_COMMUNITY): Payer: Medicare Other

## 2020-02-12 LAB — CBC
HCT: 37 % (ref 36.0–46.0)
Hemoglobin: 11.5 g/dL — ABNORMAL LOW (ref 12.0–15.0)
MCH: 30.5 pg (ref 26.0–34.0)
MCHC: 31.1 g/dL (ref 30.0–36.0)
MCV: 98.1 fL (ref 80.0–100.0)
Platelets: 235 10*3/uL (ref 150–400)
RBC: 3.77 MIL/uL — ABNORMAL LOW (ref 3.87–5.11)
RDW: 14 % (ref 11.5–15.5)
WBC: 16.9 10*3/uL — ABNORMAL HIGH (ref 4.0–10.5)
nRBC: 0 % (ref 0.0–0.2)

## 2020-02-12 LAB — BASIC METABOLIC PANEL
Anion gap: 9 (ref 5–15)
BUN: 17 mg/dL (ref 8–23)
CO2: 27 mmol/L (ref 22–32)
Calcium: 8.5 mg/dL — ABNORMAL LOW (ref 8.9–10.3)
Chloride: 104 mmol/L (ref 98–111)
Creatinine, Ser: 0.57 mg/dL (ref 0.44–1.00)
GFR calc Af Amer: >60 mL/min (ref >60)
GFR calc non Af Amer: >60 mL/min (ref >60)
Glucose, Bld: 90 mg/dL (ref 70–99)
Potassium: 3.8 mmol/L (ref 3.5–5.1)
Sodium: 140 mmol/L (ref 135–145)

## 2020-02-12 LAB — MAGNESIUM: Magnesium: 2.2 mg/dL (ref 1.7–2.4)

## 2020-02-12 MED ORDER — POLYETHYLENE GLYCOL 3350 17 G PO PACK
17.0000 g | PACK | Freq: Every day | ORAL | Status: DC
  Administered 2020-02-12 – 2020-02-14 (×3): 17 g via ORAL
  Filled 2020-02-12 (×3): qty 1

## 2020-02-12 MED ORDER — SENNOSIDES-DOCUSATE SODIUM 8.6-50 MG PO TABS
2.0000 | ORAL_TABLET | Freq: Two times a day (BID) | ORAL | Status: DC
  Administered 2020-02-12 – 2020-02-15 (×6): 2 via ORAL
  Filled 2020-02-12 (×6): qty 2

## 2020-02-12 MED ORDER — ENOXAPARIN SODIUM 40 MG/0.4ML ~~LOC~~ SOLN
40.0000 mg | SUBCUTANEOUS | Status: DC
  Administered 2020-02-12 – 2020-02-15 (×4): 40 mg via SUBCUTANEOUS
  Filled 2020-02-12 (×4): qty 0.4

## 2020-02-12 MED ORDER — GUAIFENESIN 100 MG/5ML PO SOLN
10.0000 mL | ORAL | Status: DC | PRN
  Administered 2020-02-12 – 2020-02-14 (×3): 200 mg via ORAL
  Filled 2020-02-12: qty 20
  Filled 2020-02-12: qty 10
  Filled 2020-02-12: qty 20

## 2020-02-12 MED ORDER — ACETAMINOPHEN-CODEINE #3 300-30 MG PO TABS
1.0000 | ORAL_TABLET | Freq: Four times a day (QID) | ORAL | Status: DC
  Administered 2020-02-12 – 2020-02-15 (×14): 1 via ORAL
  Filled 2020-02-12 (×14): qty 1

## 2020-02-12 MED ORDER — IPRATROPIUM-ALBUTEROL 0.5-2.5 (3) MG/3ML IN SOLN
3.0000 mL | Freq: Four times a day (QID) | RESPIRATORY_TRACT | Status: DC
  Administered 2020-02-12 – 2020-02-13 (×4): 3 mL via RESPIRATORY_TRACT
  Filled 2020-02-12 (×4): qty 3

## 2020-02-12 MED ORDER — DM-GUAIFENESIN ER 30-600 MG PO TB12
1.0000 | ORAL_TABLET | Freq: Two times a day (BID) | ORAL | Status: DC
  Administered 2020-02-12 – 2020-02-15 (×7): 1 via ORAL
  Filled 2020-02-12 (×7): qty 1

## 2020-02-12 MED ORDER — CEFDINIR 300 MG PO CAPS
300.0000 mg | ORAL_CAPSULE | Freq: Two times a day (BID) | ORAL | Status: DC
  Administered 2020-02-12 – 2020-02-15 (×7): 300 mg via ORAL
  Filled 2020-02-12 (×7): qty 1

## 2020-02-12 NOTE — Progress Notes (Signed)
[facility] Progress Note  Patient: Stacey [name]    ZOX:[id number]  DOA: 02/09/2020     Date of Service: the patient was seen and examined on 02/12/2020  Brief hospital course: Stacey [name] is a 79 y.o. 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  Currently plan is continue current treatment.  Assessment and Plan: Acute on chronic hypoxic respiratory failure secondary to pneumonia/ ILD exacerbation:  Oxygen saturation 85% on room air and ABG with PO2 66.9.   Placed on 2 L supplemental oxygen currently satting 93 to 94%.  Chest x-ray showing bibasilar hazy densities concerning for pneumonia.  SARS-CoV-2 PCR test negative. Continue vancomycin and cefepime.  Blood culture x2 so far no growth. Elevated procalcitonin level.    Normal lactic acid level. Solu-Medrol 125 mg administered.    Now on oral prednisone.  DuoNebs as needed.  Mucinex for cough. Continuous pulse ox, supplemental oxygen MRSA PCR negative.  Discontinue vancomycin.  Oral azithromycin.  Emesis:  Severe constipation No complaints of abdominal pain and abdominal exam benign.   Does have fever and leukocytosis in the setting of pneumonia.   LFTs normal.  No active emesis since she has been in the ED. Antiemetic as needed.    Normal lipase level. Continue bowel regimen  Hypertension: Currently normotensive. -Resume home meds after pharmacy med rec is done  Hyperlipidemia -Resume statin after pharmacy med rec is done  Rheumatoid arthritis -Patient on chronic steroids.  Would add scheduled steroids.  GERD Continue PPI.  Cough. Symptomatically still not better. Tussionex is not helping. We will add Tylenol  3.  Diet: Regular diet DVT Prophylaxis:  enoxaparin (LOVENOX) injection 40 mg Start: 02/12/20 1130  Advance goals of care discussion: Full code  Family Communication: no family was present at bedside, at the time of interview.   Disposition:  Status is: Inpatient  Remains inpatient appropriate because:Ongoing diagnostic testing needed not appropriate for outpatient work up  Dispo: The patient is from: Home              Anticipated d/c is to: Home              Anticipated d/c date is: 2 days              Patient currently is not medically stable to d/c.  Subjective: Reports cough.  Unable to sleep.  No nausea no vomiting.  No fever no chills.  Also reports constipation but passing gas.  Physical Exam:  General: Appear in mild distress, no Rash; Oral Mucosa Clear, moist. no Abnormal Neck Mass Or lumps, Conjunctiva normal  Cardiovascular: S1 and S2 Present, no Murmur, Respiratory: increased respiratory effort, Bilateral Air entry present and bilateral  Crackles, no wheezes Abdomen: Bowel Sound present, Soft and no tenderness Extremities: no Pedal edema, no calf tenderness Neurology: alert and oriented to time, place, and person affect appropriate. no new focal deficit Gait not checked due to patient safety concerns  Vitals:   02/12/20 0500 02/12/20 0519 02/12/20 1141 02/12/20 1342  BP:  (!) 150/73  (!) 130/54  Pulse:  71  77  Resp: 17 22  17   Temp:  (!) 97.5 F (36.4 C)  97.6 F (36.4 C)  TempSrc:  Oral  Oral  SpO2:  100%  99% 94%  Weight:      Height:        Intake/Output Summary (Last 24 hours) at 02/12/2020 1904 Last data filed at 02/12/2020 1836 Gross per 24 hour  Intake 700 ml  Output 1250 ml  Net -550 ml   Filed Weights   02/11/20 1421  Weight: 74.1 kg    Data Reviewed: I have personally reviewed and interpreted daily labs, tele strips, imagings as discussed above. I reviewed all nursing notes, pharmacy notes, vitals, pertinent old records I have discussed  plan of care as described above with RN and patient/family.  CBC: Recent Labs  Lab 02/09/20 1850 02/11/20 0750 02/12/20 0535  WBC 21.9* 21.7* 16.9*  NEUTROABS 17.8* 17.7*  --   HGB 13.4 11.5* 11.5*  HCT 42.0 36.6 37.0  MCV 97.2 98.1 98.1  PLT 221 204 182   Basic Metabolic Panel: Recent Labs  Lab 02/09/20 1850 02/11/20 0750 02/12/20 0535  NA 138 139 140  K 3.9 4.7 3.8  CL 98 106 104  CO2 27 25 27   GLUCOSE 86 96 90  BUN 12 16 17   CREATININE 0.69 0.62 0.57  CALCIUM 8.8* 8.5* 8.5*  MG  --   --  2.2    Studies: DG CHEST PORT 1 VIEW  Result Date: 02/12/2020 CLINICAL DATA:  Community-acquired pneumonia, history interstitial lung disease, rheumatoid arthritis, GERD, asthma and bronchitis EXAM: PORTABLE CHEST 1 VIEW COMPARISON:  Portable exam 1012 hours compared to 02/09/2020 and 06/11/2019 FINDINGS: Normal heart size, mediastinal contours, and pulmonary vascularity. Atherosclerotic calcification aorta. Chronic accentuation of interstitial markings more focal at the lung bases, slightly more prominent than seen on 06/11/2019 question atypical infection versus progression of chronic interstitial disease. More focal opacity is identified in the RIGHT upper lobe slightly more prominent than on the previous exam question pneumonia. No pleural effusion or pneumothorax. Bones demineralized. IMPRESSION: Chronic interstitial lung disease with increased RIGHT upper lobe infiltrate. Increased bibasilar markings since 2020 question progression of chronic interstitial lung disease versus superimposed atypical infection. Electronically Signed   By: Lavonia Dana M.D.   On: 02/12/2020 10:28    Scheduled Meds: . acetaminophen-codeine  1 tablet Oral QID  . amitriptyline  25 mg Oral QHS  . azithromycin  500 mg Oral Daily  . benzonatate  100 mg Oral TID  . cefdinir  300 mg Oral Q12H  . dextromethorphan-guaiFENesin  1 tablet Oral BID  . enoxaparin (LOVENOX) injection  40 mg Subcutaneous Q24H  .  gabapentin  400 mg Oral QHS  . ipratropium-albuterol  3 mL Nebulization QID  . losartan  25 mg Oral Daily  . melatonin  5 mg Oral QHS  . pantoprazole  40 mg Oral BID AC  . polyethylene glycol  17 g Oral Daily  . predniSONE  50 mg Oral Q breakfast  . senna-docusate  2 tablet Oral BID  . venlafaxine XR  75 mg Oral Q breakfast   Continuous Infusions:  PRN Meds: acetaminophen **OR** acetaminophen, guaiFENesin, ondansetron (ZOFRAN) IV  Time spent: 35 minutes  Author: Berle Mull, MD [facility] 02/12/2020 7:04 PM  To reach On-call, see care teams to locate the attending and reach out via [email or web address]. Between 7PM-7AM, please contact night-coverage If you still have difficulty reaching the attending provider, please page the [facility] (Director on Call) for [facility] on amion for assistance.

## 2020-02-12 NOTE — Progress Notes (Signed)
PT Cancellation Note  Patient Details Name: Stacey [name] MRN: [id number] DOB: 1941-03-25   Cancelled Treatment:    Reason Eval/Treat Not Completed: Fatigue/lethargy limiting ability to participate Pt reports fatigue from coughing all night.  Pt requests PT to check back this afternoon.  Will check back as schedule permits.   Deforest Maiden,KATHrine E 02/12/2020, 10:14 AM [name], DPT Acute Rehabilitation Services Pager: [phone] Office: [phone]

## 2020-02-12 NOTE — Evaluation (Signed)
Physical Therapy Evaluation Patient Details Name: Stacey [name] MRN: [id number] DOB: 04-Sep-1940 Today's Date: 02/12/2020   History of Present Illness  79 y.o. 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  Pt currently with functional limitations due to the deficits listed below (see PT Problem List). Pt will benefit from skilled PT to increase their independence and safety with mobility to allow discharge to the venue listed below.  Pt only able to tolerate short distance ambulation in hallway and required 4L O2 [place] for SPO2 89% during ambulation.  Pt reports she has assist at home, rollator, and uses O2 PRN at baseline.  Recommend HHPT upon d/c.  Pt encouraged to ambulate with staff as tolerated.     Follow Up Recommendations Home health PT;Supervision for mobility/OOB    Equipment Recommendations  None recommended by PT    Recommendations for Other Services       Precautions / Restrictions Precautions Precautions: Fall Precaution Comments: monitor SpO2, O2 PRN at baseline      Mobility  Bed Mobility Overal bed mobility: Needs Assistance Bed Mobility: Supine to Sit;Sit to Supine     Supine to sit: Min guard Sit to supine: Min guard      Transfers Overall transfer level: Needs assistance Equipment used: Rolling walker (2 wheeled) Transfers: Sit to/from Stand Sit to Stand: Min guard         General transfer comment: verbal cues for hand placement  Ambulation/Gait Ambulation/Gait assistance: Min assist Gait Distance (Feet): 40 Feet Assistive  device: Rolling walker (2 wheeled) Gait Pattern/deviations: Step-through pattern;Decreased stride length     General Gait Details: verbal cues for pursed lip breathing, 2 standing rest breaks required, Spo2 dropped to 79% on 2L O2 [place] and SPO2 89% on 4L O2 [place] during ambulation, distance limited by fatigue and weakness, 2/4 dyspnea  Stairs            Wheelchair Mobility    Modified Rankin (Stroke Patients Only)       Balance                                             Pertinent Vitals/Pain Pain Assessment: No/denies pain    Home Living Family/patient expects to be discharged to:: Private residence Living Arrangements: Spouse/significant other Available Help at Discharge: Family Type of Home: House Home Access: Stairs to enter Entrance Stairs-Rails: None Entrance Stairs-Number of Steps: 2 Home Layout: Able to live on main level with bedroom/bathroom Home Equipment: Shower seat - built in;Cane - single point;Walker - 4 wheels;Bedside commode      Prior Function Level of Independence: Independent with assistive device(s)         Comments: using rollator for mobility     Hand Dominance        Extremity/Trunk Assessment        Lower Extremity Assessment Lower Extremity Assessment: Generalized weakness    Cervical / Trunk Assessment Cervical / Trunk Assessment: Normal  Communication   Communication: No difficulties  Cognition Arousal/Alertness: Awake/alert Behavior During Therapy: WFL for tasks  assessed/performed Overall Cognitive Status: Within Functional Limits for tasks assessed                                        General Comments      Exercises     Assessment/Plan    PT Assessment Patient needs continued PT services  PT Problem List Decreased strength;Decreased mobility;Decreased balance;Decreased knowledge of use of DME;Cardiopulmonary status limiting activity;Decreased activity tolerance       PT  Treatment Interventions DME instruction;Therapeutic exercise;Gait training;Balance training;Functional mobility training;Therapeutic activities;Patient/family education    PT Goals (Current goals can be found in the Care Plan section)  Acute Rehab PT Goals PT Goal Formulation: With patient Time For Goal Achievement: 02/26/20 Potential to Achieve Goals: Good    Frequency Min 3X/week   Barriers to discharge        Co-evaluation               AM-PAC PT "6 Clicks" Mobility  Outcome Measure Help needed turning from your back to your side while in a flat bed without using bedrails?: A Little Help needed moving from lying on your back to sitting on the side of a flat bed without using bedrails?: A Little Help needed moving to and from a bed to a chair (including a wheelchair)?: A Little Help needed standing up from a chair using your arms (e.g., wheelchair or bedside chair)?: A Little Help needed to walk in hospital room?: A Little Help needed climbing 3-5 steps with a railing? : A Lot 6 Click Score: 17    End of Session Equipment Utilized During Treatment: Gait belt;Oxygen Activity Tolerance: Patient tolerated treatment well Patient left: in bed;with call bell/phone within reach;with bed alarm set   PT Visit Diagnosis: Other abnormalities of gait and mobility (R26.89)    Time: 8250-5397 PT Time Calculation (min) (ACUTE ONLY): 21 min   Charges:   PT Evaluation $PT Eval Low Complexity: 1 Low        Kati PT, DPT Acute Rehabilitation Services Pager: [phone] Office: [phone]  York Ram E 02/12/2020, 3:11 PM

## 2020-02-12 NOTE — Care Management Important Message (Signed)
Important Message  Patient Details IM Letter given to the Patient Name: Stacey [name] MRN: [id number] Date of Birth: 11-22-1940   Medicare Important Message Given:  Yes     Kerin Salen 02/12/2020, 10:37 AM

## 2020-02-12 NOTE — Plan of Care (Signed)

## 2020-02-13 LAB — BASIC METABOLIC PANEL
Anion gap: 8 (ref 5–15)
BUN: 13 mg/dL (ref 8–23)
CO2: 30 mmol/L (ref 22–32)
Calcium: 8.7 mg/dL — ABNORMAL LOW (ref 8.9–10.3)
Chloride: 100 mmol/L (ref 98–111)
Creatinine, Ser: 0.51 mg/dL (ref 0.44–1.00)
GFR calc Af Amer: >60 mL/min (ref >60)
GFR calc non Af Amer: >60 mL/min (ref >60)
Glucose, Bld: 95 mg/dL (ref 70–99)
Potassium: 4.2 mmol/L (ref 3.5–5.1)
Sodium: 138 mmol/L (ref 135–145)

## 2020-02-13 LAB — CBC
HCT: 35.5 % — ABNORMAL LOW (ref 36.0–46.0)
Hemoglobin: 11.1 g/dL — ABNORMAL LOW (ref 12.0–15.0)
MCH: 30.7 pg (ref 26.0–34.0)
MCHC: 31.3 g/dL (ref 30.0–36.0)
MCV: 98.1 fL (ref 80.0–100.0)
Platelets: 206 10*3/uL (ref 150–400)
RBC: 3.62 MIL/uL — ABNORMAL LOW (ref 3.87–5.11)
RDW: 14 % (ref 11.5–15.5)
WBC: 12.8 10*3/uL — ABNORMAL HIGH (ref 4.0–10.5)
nRBC: 0 % (ref 0.0–0.2)

## 2020-02-13 MED ORDER — LACTULOSE 10 GM/15ML PO SOLN
20.0000 g | Freq: Two times a day (BID) | ORAL | Status: DC
  Administered 2020-02-13 – 2020-02-14 (×2): 20 g via ORAL
  Filled 2020-02-13 (×4): qty 30

## 2020-02-13 MED ORDER — SODIUM CHLORIDE 3 % IN NEBU
4.0000 mL | INHALATION_SOLUTION | Freq: Every day | RESPIRATORY_TRACT | Status: AC
  Administered 2020-02-13 – 2020-02-15 (×3): 4 mL via RESPIRATORY_TRACT
  Filled 2020-02-13 (×3): qty 4

## 2020-02-13 MED ORDER — IPRATROPIUM-ALBUTEROL 0.5-2.5 (3) MG/3ML IN SOLN
3.0000 mL | Freq: Three times a day (TID) | RESPIRATORY_TRACT | Status: DC
  Administered 2020-02-13 – 2020-02-15 (×7): 3 mL via RESPIRATORY_TRACT
  Filled 2020-02-13 (×7): qty 3

## 2020-02-13 NOTE — TOC Initial Note (Signed)
Transition of Care [facility]) - Initial/Assessment Note    Patient Details  Name: Stacey [name] MRN: [id number] Date of Birth: [birth year]  Transition of Care [facility] CM/SW Contact:    Joaquin Courts, RN Phone Number: 02/13/2020, 11:13 AM  Clinical Narrative:    CM spoke with patient regarding recommendation for [facility] services.  Patient set up with [facility] for HHPT/OT.                 Expected Discharge Plan: [place] Barriers to Discharge: Continued Medical Work up   Patient Goals and CMS Choice Patient states their goals for this hospitalization and ongoing recovery are:: to go home with therapy CMS Medicare.gov Compare Post Acute Care list provided to:: Patient Choice offered to / list presented to : Patient  Expected Discharge Plan and Services Expected Discharge Plan: [place]   Discharge Planning Services: CM Consult Post Acute Care Choice: [place] arrangements for the past 2 months: Single Family Home                 DME Arranged: N/A DME Agency: NA       HH Arranged: PT, OT HH Agency: [place] Date [facility] Agency Contacted: 02/13/20 Time HH Agency Contacted: 50 Representative spoke with at [place]: Tommi Rumps  Prior Living Arrangements/Services Living arrangements for the past 2 months: [place]   Patient language and need for interpreter reviewed:: Yes Do you feel safe going back to the place where you live?: Yes      Need for Family Participation in Patient Care: Yes (Comment) Care giver support system in place?: Yes (comment)   Criminal Activity/Legal Involvement Pertinent to Current Situation/Hospitalization: No - Comment as needed  Activities of Daily Living Home Assistive Devices/Equipment: None ADL Screening (condition at time of admission) Patient's cognitive ability adequate to safely complete daily activities?: Yes Is the patient deaf or have difficulty hearing?: No Does the  patient have difficulty seeing, even when wearing glasses/contacts?: No Does the patient have difficulty concentrating, remembering, or making decisions?: No Patient able to express need for assistance with ADLs?: Yes Does the patient have difficulty dressing or bathing?: No Independently performs ADLs?: Yes (appropriate for developmental age) Does the patient have difficulty walking or climbing stairs?: No Weakness of Legs: None Weakness of Arms/Hands: None  Permission Sought/Granted                  Emotional Assessment Appearance:: Appears stated age Attitude/Demeanor/Rapport: Engaged Affect (typically observed): Accepting Orientation: : Oriented to Self, Oriented to Place, Oriented to  Time, Oriented to Situation   Psych Involvement: No (comment)  Admission diagnosis:  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[place]) [J84.9] Community acquired pneumonia, unspecified laterality [J18.9] Patient Active Problem List   Diagnosis Date Noted  . CAP (community acquired pneumonia) 02/09/2020  . Acute on chronic respiratory failure with hypoxia ([place]) 07/06/2019  . Fall at home, initial encounter 07/06/2019  . Orthostatic hypotension 07/06/2019  . Rib fractures 07/04/2019  . Pneumonia due to COVID-19 virus 06/11/2019  . 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[place]) 05/07/2019  . Acute asthma exacerbation 12/18/2018  . Elevated troponin 12/18/2018  . Acute bronchitis 08/30/2018  . Chronic respiratory failure with hypoxia ([place]) 04/06/2018  . Essential hypertension 03/24/2018  . Depression 03/24/2018  . Asthma exacerbation 03/23/2018  . DOE (dyspnea on exertion) 03/21/2018  . 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 esophagitis 03/21/2018  . Chronic lung disease   . Hypoxia   . Dyspnea 01/23/2018  .  ILD (interstitial lung disease) ([place]) 01/23/2018  . 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 Multiple pulmonary nodules/RA lung dz  05/29/2013  . Cough variant asthma vs uacs  05/29/2013  .  Rheumatoid arthritis ([place]) 01/13/2012  . Osteopenia 01/13/2012  . Lichen sclerosus et atrophicus of the vulva 01/13/2012  . Asthma 01/13/2012   PCP:  Shon Baton, MD Pharmacy:   [facility], [address] [place] [address] Phone: [phone] Fax: [place], [place] [address] Phone: [phone] Fax: [phone]  [facility] [place], [place] - [address] [place] [address] Phone: [phone] Fax: [phone] [facility] - [place], [place] - [address] [place] [address] [place] [address] Phone: [phone] Fax: [phone]     Social Determinants of Health (SDOH) Interventions    Readmission Risk Interventions No flowsheet data found.

## 2020-02-13 NOTE — Progress Notes (Signed)
Physical Therapy Treatment Patient Details Name: Stacey [name] MRN: [id number] DOB: Aug 18, 1940 Today's Date: 02/13/2020    History of Present Illness 79 y.o. 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  Trial RA decreased to 83% with HR 87.  Reapplied oxygen pt required 3 lts to achieve sats >90% during activity.   Tolerated session well but a lot of coughing after.   SATURATION QUALIFICATIONS: (This note is used to comply with regulatory documentation for home oxygen)  Patient Saturations on Room Air at Rest = 91%  Patient Saturations on Room Air while Ambulating 125 feet = 82%  Patient Saturations on 3 Liters of oxygen while Ambulating = 90%  Please briefly explain why patient needs home oxygen:  Pt required supplemental oxygen to achieve therapeutic level   Follow Up Recommendations  Home health PT;Supervision for mobility/OOB     Equipment Recommendations  None recommended by PT    Recommendations for Other Services       Precautions / Restrictions Precautions Precautions: Fall Precaution Comments: monitor SpO2, O2 PRN at baseline Restrictions Weight Bearing Restrictions: No    Mobility  Bed Mobility Overal bed mobility: Modified Independent             General bed mobility comments: only required increased time  Transfers Overall transfer level: Needs assistance Equipment used: 4-wheeled walker Transfers: Sit to/from [facility] Sit to Stand: Supervision Stand pivot transfers:  Supervision;Min guard       General transfer comment: safety with turns also assisted with a toilet transfer at Supervision level  Ambulation/Gait Ambulation/Gait assistance: Supervision;Min guard Gait Distance (Feet): 125 Feet Assistive device: 4-wheeled walker Gait Pattern/deviations: Step-through pattern;Decreased stride length Gait velocity: decreased   General Gait Details: used a Teaching laboratory technician which she has at home for community use (not in house) tolerated an increased distance.  Trial RA decreased to 82% with HR 87 so reapplied 3 lts to achieve sats > 90%.   Stairs             Wheelchair Mobility    Modified Rankin (Stroke Patients Only)       Balance                                            Cognition Arousal/Alertness: Awake/alert Behavior During Therapy: WFL for tasks assessed/performed Overall Cognitive Status: Within Functional Limits for tasks assessed                                 General Comments: AxO x 3 pleasant (a little anxious)      Exercises      General Comments        Pertinent Vitals/Pain Pain Assessment: No/denies pain    Home Living                      Prior Function  PT Goals (current goals can now be found in the care plan section) Progress towards PT goals: Progressing toward goals    Frequency    Min 3X/week      PT Plan Current plan remains appropriate    Co-evaluation              AM-PAC PT "6 Clicks" Mobility   Outcome Measure  Help needed turning from your back to your side while in a flat bed without using bedrails?: None Help needed moving from lying on your back to sitting on the side of a flat bed without using bedrails?: None Help needed moving to and from a bed to a chair (including a wheelchair)?: None Help needed standing up from a chair using your arms (e.g., wheelchair or bedside chair)?: A Little Help needed to walk in hospital  room?: A Little Help needed climbing 3-5 steps with a railing? : A Little 6 Click Score: 21    End of Session Equipment Utilized During Treatment: Gait belt;Oxygen Activity Tolerance: Patient tolerated treatment well Patient left: in chair;with call bell/phone within reach Nurse Communication: Mobility status PT Visit Diagnosis: Other abnormalities of gait and mobility (R26.89)     Time: 8264-1583 PT Time Calculation (min) (ACUTE ONLY): 32 min  Charges:  $Gait Training: 8-22 mins $Therapeutic Activity: 8-22 mins                     Rica Koyanagi  PTA Acute  Rehabilitation Services Pager      [phone] Office      [phone]

## 2020-02-13 NOTE — Progress Notes (Signed)
[facility] Progress Note  Patient: Stacey [name]    QJJ:[id number]  DOA: 02/09/2020     Date of Service: the patient was seen and examined on 02/13/2020  Brief hospital course: Stacey [name] is a 79 y.o. 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  Currently plan is continue monitor for improvement in constipation and respiratory distress  Assessment and Plan: Acute on chronic hypoxic respiratory failure secondary to pneumonia/ ILD exacerbation:  Oxygen saturation 85% on room air and ABG with PO2 66.9.   Placed on 2 L supplemental oxygen currently satting 93 to 94%.  Chest x-ray showing bibasilar hazy densities concerning for pneumonia.    Repeat chest x-ray shows no evidence of acute change. SARS-CoV-2 PCR test negative. Treated with vancomycin and cefepime.  Blood culture x2 so far no growth.  Changing to oral antibiotics: Continues with azithromycin for atypical coverage. Elevated procalcitonin level.    Normal lactic acid level. Solu-Medrol 125 mg administered.    Now on oral prednisone.  DuoNebs as needed.  Mucinex for cough. Continuous pulse ox, supplemental oxygen MRSA PCR negative. Hypertonic saline nebulizer ordered.  Emesis:  Severe constipation No complaints of abdominal pain and abdominal exam benign.   Does have fever and leukocytosis in the setting of pneumonia.   LFTs normal.  No active emesis since she has been in the ED. Antiemetic as needed.    Normal lipase level. Continue bowel regimen  Hypertension:  Currently normotensive.  Hyperlipidemia -Resume statin  Rheumatoid arthritis -Patient on chronic steroids.  Would add scheduled steroids.  GERD Continue  PPI.  Cough. Symptomatically still not better. Tussionex is not helping. We will add Tylenol 3.  Diet: Regular diet DVT Prophylaxis:  enoxaparin (LOVENOX) injection 40 mg Start: 02/12/20 1130  Advance goals of care discussion: Full code  Family Communication: no family was present at bedside, at the time of interview.   Disposition:  Status is: Inpatient  Remains inpatient appropriate because:Ongoing diagnostic testing needed not appropriate for outpatient work up  Dispo: The patient is from: Home              Anticipated d/c is to: Home              Anticipated d/c date is: Likely 02/14/2020 depending on improvement in respiratory distress              Patient currently is not medically stable to d/c.  Subjective: Continues to have cough, no nausea no vomiting.  Continues to have shortness of breath.  Was able to sleep last night as the cough was under control though.  Physical Exam:  General: Appear in mild distress, no Rash; Oral Mucosa Clear, moist.  No thrush. No Abnormal Neck Mass Or lumps, Conjunctiva normal  Cardiovascular: S1 and S2 Present, no Murmur, Respiratory: increased respiratory effort, Bilateral Air entry present and bilateral  Crackles, bilateral wheezes Abdomen: Bowel Sound present, Soft and no tenderness Extremities: no Pedal edema, no calf tenderness Neurology: alert and oriented to time, place, and person affect appropriate. no new focal deficit Gait not checked due to patient safety concerns  Vitals:   02/12/20 2131 02/13/20 0520 02/13/20 0925 02/13/20 1356  BP:  (!) 158/80  (!) 147/62  Pulse:  67  81  Resp:  (!) 24  19  Temp:  97.6 F (36.4 C)  98 F (36.7 C)  TempSrc:  Oral  Oral  SpO2: 98% 99% 97% 100%  Weight:      Height:        Intake/Output Summary (Last 24 hours) at 02/13/2020 1604 Last data filed at 02/13/2020 1211 Gross per 24 hour  Intake 480 ml  Output 1775 ml  Net -1295 ml   Filed Weights   02/11/20 1421  Weight: 74.1 kg     Data Reviewed: I have personally reviewed and interpreted daily labs, tele strips, imagings as discussed above. I reviewed all nursing notes, pharmacy notes, vitals, pertinent old records I have discussed plan of care as described above with RN and patient/family.  CBC: Recent Labs  Lab 02/09/20 1850 02/11/20 0750 02/12/20 0535 02/13/20 0552  WBC 21.9* 21.7* 16.9* 12.8*  NEUTROABS 17.8* 17.7*  --   --   HGB 13.4 11.5* 11.5* 11.1*  HCT 42.0 36.6 37.0 35.5*  MCV 97.2 98.1 98.1 98.1  PLT 221 204 235 726   Basic Metabolic Panel: Recent Labs  Lab 02/09/20 1850 02/11/20 0750 02/12/20 0535 02/13/20 0552  NA 138 139 140 138  K 3.9 4.7 3.8 4.2  CL 98 106 104 100  CO2 27 25 27 30   GLUCOSE 86 96 90 95  BUN 12 16 17 13   CREATININE 0.69 0.62 0.57 0.51  CALCIUM 8.8* 8.5* 8.5* 8.7*  MG  --   --  2.2  --     Studies: No results found.  Scheduled Meds: . acetaminophen-codeine  1 tablet Oral QID  . amitriptyline  25 mg Oral QHS  . azithromycin  500 mg Oral Daily  . benzonatate  100 mg Oral TID  . cefdinir  300 mg Oral Q12H  . dextromethorphan-guaiFENesin  1 tablet Oral BID  . enoxaparin (LOVENOX) injection  40 mg Subcutaneous Q24H  . gabapentin  400 mg Oral QHS  . ipratropium-albuterol  3 mL Nebulization TID  . lactulose  20 g Oral BID  . losartan  25 mg Oral Daily  . melatonin  5 mg Oral QHS  . pantoprazole  40 mg Oral BID AC  . polyethylene glycol  17 g Oral Daily  . predniSONE  50 mg Oral Q breakfast  . senna-docusate  2 tablet Oral BID  . sodium chloride HYPERTONIC  4 mL Nebulization Daily  . venlafaxine XR  75 mg Oral Q breakfast   Continuous Infusions:  PRN Meds: acetaminophen **OR** acetaminophen, guaiFENesin, ondansetron (ZOFRAN) IV  Time spent: 35 minutes  Author: Berle Mull, MD [facility] 02/13/2020 4:04 PM  To reach On-call, see care teams to locate the attending and reach out via [email or web address]. Between 7PM-7AM, please contact  night-coverage If you still have difficulty reaching the attending provider, please page the [facility] (Director on Call) for [facility] on amion for assistance.

## 2020-02-14 DIAGNOSIS — K5900 Constipation, unspecified: Secondary | ICD-10-CM

## 2020-02-14 DIAGNOSIS — J189 Pneumonia, unspecified organism: Secondary | ICD-10-CM

## 2020-02-14 DIAGNOSIS — M069 Rheumatoid arthritis, unspecified: Secondary | ICD-10-CM

## 2020-02-14 DIAGNOSIS — I10 Essential (primary) hypertension: Secondary | ICD-10-CM

## 2020-02-14 DIAGNOSIS — J849 Interstitial pulmonary disease, unspecified: Secondary | ICD-10-CM

## 2020-02-14 LAB — CBC
HCT: 36.8 % (ref 36.0–46.0)
Hemoglobin: 11.7 g/dL — ABNORMAL LOW (ref 12.0–15.0)
MCH: 31.1 pg (ref 26.0–34.0)
MCHC: 31.8 g/dL (ref 30.0–36.0)
MCV: 97.9 fL (ref 80.0–100.0)
Platelets: 225 10*3/uL (ref 150–400)
RBC: 3.76 MIL/uL — ABNORMAL LOW (ref 3.87–5.11)
RDW: 13.9 % (ref 11.5–15.5)
WBC: 12.1 10*3/uL — ABNORMAL HIGH (ref 4.0–10.5)
nRBC: 0 % (ref 0.0–0.2)

## 2020-02-14 LAB — BASIC METABOLIC PANEL
Anion gap: 13 (ref 5–15)
BUN: 14 mg/dL (ref 8–23)
CO2: 28 mmol/L (ref 22–32)
Calcium: 9 mg/dL (ref 8.9–10.3)
Chloride: 103 mmol/L (ref 98–111)
Creatinine, Ser: 0.56 mg/dL (ref 0.44–1.00)
GFR calc Af Amer: >60 mL/min (ref >60)
GFR calc non Af Amer: >60 mL/min (ref >60)
Glucose, Bld: 87 mg/dL (ref 70–99)
Potassium: 4.4 mmol/L (ref 3.5–5.1)
Sodium: 144 mmol/L (ref 135–145)

## 2020-02-14 MED ORDER — POLYETHYLENE GLYCOL 3350 17 G PO PACK
17.0000 g | PACK | Freq: Two times a day (BID) | ORAL | Status: DC
  Administered 2020-02-15: 17 g via ORAL
  Filled 2020-02-14 (×3): qty 1

## 2020-02-14 MED ORDER — SORBITOL 70 % SOLN
960.0000 mL | TOPICAL_OIL | Freq: Once | ORAL | Status: DC
  Filled 2020-02-14: qty 473

## 2020-02-14 MED ORDER — MOMETASONE FURO-FORMOTEROL FUM 100-5 MCG/ACT IN AERO
2.0000 | INHALATION_SPRAY | Freq: Two times a day (BID) | RESPIRATORY_TRACT | Status: DC
  Administered 2020-02-14 – 2020-02-15 (×3): 2 via RESPIRATORY_TRACT
  Filled 2020-02-14: qty 8.8

## 2020-02-14 MED ORDER — MAGNESIUM CITRATE PO SOLN
1.0000 | Freq: Once | ORAL | Status: AC
  Administered 2020-02-14: 1 via ORAL
  Filled 2020-02-14: qty 296

## 2020-02-14 MED ORDER — HYDROCODONE-HOMATROPINE 5-1.5 MG/5ML PO SYRP: 5.0000 mL | ORAL_SOLUTION | Freq: Four times a day (QID) | ORAL | Status: DC | PRN

## 2020-02-14 NOTE — TOC Progression Note (Signed)
Transition of Care [facility]) - Progression Note    Patient Details  Name: Stacey [name] MRN: [id number] Date of Birth: 07-Jan-1941  Transition of Care [facility]) CM/SW Contact  Zeola Brys, Juliann Pulse, RN Phone Number: 02/14/2020, 1:13 PM  Clinical Narrative: Patient already has home 02 through [place] of home 02 order.Ptient has travel tank for home.Alvis Lemmings HHPT/OT ordered.    Expected Discharge Plan: [place] Barriers to Discharge: Continued Medical Work up  Expected Discharge Plan and Services Expected Discharge Plan: [place]   Discharge Planning Services: CM Consult Post Acute Care Choice: [place] arrangements for the past 2 months: Single Family Home                 DME Arranged: N/A DME Agency: NA       HH Arranged: PT, OT HH Agency: [place] Date [place]: 02/13/20 Time [place]: [id number] Representative spoke with at [place]: [place] ([place]) Interventions    Readmission Risk Interventions No flowsheet data found.

## 2020-02-14 NOTE — Progress Notes (Signed)
PROGRESS NOTE    Stacey SCHILTZ  BHA:[id number] DOB: 12-22-1940 DOA: 02/09/2020 PCP: Shon Baton, MD    Chief Complaint  Patient presents with  . Cough  . Wheezing    Brief Narrative:  Stacey [name] a 79 y.o.femalewith medical history significant ofILD on chronic home oxygen and followed by pulmonology, asthma, hypertension, hyperlipidemia, rheumatoid arthritison chronic steroid therapy, GERD, COVID-19 infection in November 2020 presenting with complaints ofincreasing shortness of breath, dry cough, and wheezing for the past 3 days.  Currently plan is continue monitor for improvement in constipation and respiratory distress   Assessment & Plan:   Principal Problem:   Acute on chronic respiratory failure with hypoxia (HCC) Active Problems:   Rheumatoid arthritis ([place])   Essential hypertension   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[place])   CAP (community acquired pneumonia)  1 acute on chronic hypoxic respiratory failure secondary to pneumonia/ILD exacerbation On presentation patient noted to have sats of 85% on room air ABG with a PO2 2 of 66.9.  Patient on supplemental O2 2 L with sats 93 to 94%.  Chest x-ray done concerning for bibasilar hazy densities concerning for pneumonia.  Repeat chest x-ray with chronic interstitial lung disease with increased right upper lobe infiltrate.  Increased bibasilar markings since 2020 question progression of chronic interstitial lung disease versus superimposed atypical infection.  SARS Covid 2 PCR negative.  Patient pancultured with blood cultures with no growth to date.  Patient was on IV vancomycin IV cefepime and has been transitioned to oral azithromycin and oral Omnicef.  Some clinical improvement.  Patient received IV Solu-Medrol 125 mg daily.  Increase Tessalon Perles to 200 mg 3 times daily.  Continue Mucinex.  Placed on Hycodan as needed.  Continue duo nebs.  Continue Dulera.  Continue oral prednisone with a slow taper.  Supportive care.   Follow.  Will need close outpatient follow-up with pulmonary.  2.  Severe constipation Patient stated has not had a bowel movement in over a week.  Patient denies any abdominal pain.  Increase MiraLAX to twice daily.  Continue current dose of lactulose.  Will give mag citrate x1.SMOG enema x1.  Supportive care.  Follow.  3.  Hypertension Continue current regimen Cozaar.  May need to uptitrate for better blood pressure control.  Follow.  4.  Gastroesophageal reflux disease PPI.  5.  Hyperlipidemia Statin.  6.  Rheumatoid arthritis Patient on chronic steroids.  Continue current dose of steroids.  7.  Cough Patient still with a cough.  Continue PPI, Mucinex DM.  Increase Tessalon Perles to 200 mg 3 times daily.  Placed on Hycodan as needed.   DVT prophylaxis: Lovenox Code Status: Full Family Communication: Updated patient.  No family at bedside. Disposition:   Status is: Inpatient    Dispo: The patient is from: Home              Anticipated d/c is to: Home              Anticipated d/c date is: 1 to 2 days.              Patient currently constipated, improving clinically slowly.       Consultants:   None  Procedures:   Chest x-ray 02/12/2020, 02/09/2020  Antimicrobials:   IV cefepime 02/09/2020>>> 02/12/2020  IV vancomycin 02/09/2020>>>> 02/11/2020  Oral azithromycin 02/11/2020>>>  Omnicef 02/12/2020>>>>>   Subjective: Patient sitting up in bed on the telephone.  Denies any chest pain.  Feels shortness of breath treat which  she had presented with is improving.  Still with a cough.  Has not had a bowel movement in 1 week.  Objective: Vitals:   02/14/20 0600 02/14/20 0759 02/14/20 0829 02/14/20 0920  BP: (!) 150/65   (!) 167/77  Pulse:   72 77  Resp:    16  Temp:      TempSrc:      SpO2:  99% 100%   Weight:      Height:        Intake/Output Summary (Last 24 hours) at 02/14/2020 1200 Last data filed at 02/14/2020 6387 Gross per 24 hour  Intake --  Output 3325 ml   Net -3325 ml   Filed Weights   02/11/20 1421  Weight: 74.1 kg    Examination:  General exam: Appears calm and comfortable  Respiratory system: Some diffuse fine crackles noted.  No wheezing.  Speaking in full sentences.  Normal respiratory effort. Cardiovascular system: S1 & S2 heard, RRR. No JVD, murmurs, rubs, gallops or clicks. No pedal edema. Gastrointestinal system: Abdomen is nondistended, soft and nontender. No organomegaly or masses felt. Normal bowel sounds heard. Central nervous system: Alert and oriented. No focal neurological deficits. Extremities: Symmetric 5 x 5 power. Skin: No rashes, lesions or ulcers Psychiatry: Judgement and insight appear normal. Mood & affect appropriate.     Data Reviewed: I have personally reviewed following labs and imaging studies  CBC: Recent Labs  Lab 02/09/20 1850 02/11/20 0750 02/12/20 0535 02/13/20 0552 02/14/20 0539  WBC 21.9* 21.7* 16.9* 12.8* 12.1*  NEUTROABS 17.8* 17.7*  --   --   --   HGB 13.4 11.5* 11.5* 11.1* 11.7*  HCT 42.0 36.6 37.0 35.5* 36.8  MCV 97.2 98.1 98.1 98.1 97.9  PLT 221 204 235 206 564    Basic Metabolic Panel: Recent Labs  Lab 02/09/20 1850 02/11/20 0750 02/12/20 0535 02/13/20 0552 02/14/20 0539  NA 138 139 140 138 144  K 3.9 4.7 3.8 4.2 4.4  CL 98 106 104 100 103  CO2 27 25 27 30 28   GLUCOSE 86 96 90 95 87  BUN 12 16 17 13 14   CREATININE 0.69 0.62 0.57 0.51 0.56  CALCIUM 8.8* 8.5* 8.5* 8.7* 9.0  MG  --   --  2.2  --   --     GFR: Estimated Creatinine Clearance: 56.6 mL/min (by C-G formula based on SCr of 0.56 mg/dL).  Liver Function Tests: Recent Labs  Lab 02/09/20 1850  AST 25  ALT 15  ALKPHOS 50  BILITOT 0.6  PROT 6.6  ALBUMIN 3.6    CBG: No results for input(s): GLUCAP in the last 168 hours.   Recent Results (from the past 240 hour(s))  SARS Coronavirus 2 by RT PCR (hospital order, performed in [facility] hospital lab) Nasopharyngeal Nasopharyngeal Swab     Status:  None   Collection Time: 02/09/20  6:19 PM   Specimen: Nasopharyngeal Swab  Result Value Ref Range Status   SARS Coronavirus 2 NEGATIVE NEGATIVE Final    Comment: (NOTE) SARS-CoV-2 target nucleic acids are NOT DETECTED.  The SARS-CoV-2 RNA is generally detectable in upper and lower respiratory specimens during the acute phase of infection. The lowest concentration of SARS-CoV-2 viral copies this assay can detect is 250 copies / mL. A negative result does not preclude SARS-CoV-2 infection and should not be used as the sole basis for treatment or other patient management decisions.  A negative result may occur with improper specimen collection / handling,  submission of specimen other than nasopharyngeal swab, presence of viral mutation(s) within the areas targeted by this assay, and inadequate number of viral copies (<250 copies / mL). A negative result must be combined with clinical observations, patient history, and epidemiological information.  Fact Sheet for Patients:   StrictlyIdeas.no  Fact Sheet for Healthcare Providers: BankingDealers.co.za  This test is not yet approved or  cleared by the [place] FDA and has been authorized for detection and/or diagnosis of SARS-CoV-2 by FDA under an Emergency Use Authorization (EUA).  This EUA will remain in effect (meaning this test can be used) for the duration of the COVID-19 declaration under Section 564(b)(1) of the Act, 21 U.S.C. section 360bbb-3(b)(1), unless the authorization is terminated or revoked sooner.  Performed at [facility], [place] [address], [place], [place] [address]   Blood culture (routine x 2)     Status: None (Preliminary result)   Collection Time: 02/10/20 12:09 AM   Specimen: BLOOD LEFT FOREARM  Result Value Ref Range Status   Specimen Description   Final    BLOOD LEFT FOREARM Performed at [place] [address], [place], [place] [address]    Special Requests   Final    BOTTLES DRAWN AEROBIC AND ANAEROBIC Blood Culture adequate volume Performed at [place] [address], [place], [place] [address]    Culture   Final    NO GROWTH 4 DAYS Performed at [facility], [place] [address], [place], [place] [address]    Report Status PENDING  Incomplete  Blood culture (routine x 2)     Status: None (Preliminary result)   Collection Time: 02/10/20 12:09 AM   Specimen: BLOOD  Result Value Ref Range Status   Specimen Description   Final    BLOOD RIGHT ANTECUBITAL Performed at [place] [address] [place], [place] [address]    Special Requests   Final    BOTTLES DRAWN AEROBIC AND ANAEROBIC Blood Culture results may not be optimal due to an inadequate volume of blood received in culture bottles Performed at [place] [address], [place], [place] [address]    Culture   Final    NO GROWTH 4 DAYS Performed at [facility], [place] [address], [place], [place] [address]    Report Status PENDING  Incomplete  MRSA PCR Screening     Status: None   Collection Time: 02/10/20  4:54 PM   Specimen: Nasopharyngeal  Result Value Ref Range Status   MRSA by PCR NEGATIVE NEGATIVE Final    Comment:        The GeneXpert MRSA Assay (FDA approved for NASAL specimens only), is one component of a comprehensive MRSA colonization surveillance program. It is not intended to diagnose MRSA infection nor to guide or monitor treatment for MRSA infections. Performed at [facility], [place] [address] [place], [place] [address]          Radiology Studies: No results found.      Scheduled Meds: . acetaminophen-codeine  1 tablet Oral QID  . amitriptyline  25 mg Oral QHS  . azithromycin  500 mg Oral Daily  . benzonatate  100 mg Oral TID  . cefdinir  300 mg Oral Q12H  . dextromethorphan-guaiFENesin  1 tablet  Oral BID  . enoxaparin (LOVENOX) injection  40 mg Subcutaneous Q24H  . gabapentin  400 mg Oral QHS  . ipratropium-albuterol  3 mL  Nebulization TID  . lactulose  20 g Oral BID  . losartan  25 mg Oral Daily  . melatonin  5 mg Oral QHS  . mometasone-formoterol  2 puff Inhalation BID  . pantoprazole  40 mg Oral BID AC  . polyethylene glycol  17 g Oral Daily  . predniSONE  50 mg Oral Q breakfast  . senna-docusate  2 tablet Oral BID  . sodium chloride HYPERTONIC  4 mL Nebulization Daily  . venlafaxine XR  75 mg Oral Q breakfast   Continuous Infusions:   LOS: 5 days    Time spent: 35 minutes    Irine Seal, MD [facility]   To contact the attending provider between 7A-7P or the covering provider during after hours 7P-7A, please log into the web site [email or web address] and access using universal [place] password for that web site. If you do not have the password, please call the hospital operator.  02/14/2020, 12:00 PM

## 2020-02-14 NOTE — Consult Note (Signed)
The nurse asked me to visit. Pt was alert and aware sitting up in chair. She states she is doing better. She said that she has been praying and lots of other have been praying for her. She has no needs at present. No further follow-up will offered unless requested.

## 2020-02-15 LAB — CBC
HCT: 35.3 % — ABNORMAL LOW (ref 36.0–46.0)
Hemoglobin: 11.3 g/dL — ABNORMAL LOW (ref 12.0–15.0)
MCH: 30.6 pg (ref 26.0–34.0)
MCHC: 32 g/dL (ref 30.0–36.0)
MCV: 95.7 fL (ref 80.0–100.0)
Platelets: 250 10*3/uL (ref 150–400)
RBC: 3.69 MIL/uL — ABNORMAL LOW (ref 3.87–5.11)
RDW: 13.9 % (ref 11.5–15.5)
WBC: 11.3 10*3/uL — ABNORMAL HIGH (ref 4.0–10.5)
nRBC: 0 % (ref 0.0–0.2)

## 2020-02-15 LAB — BASIC METABOLIC PANEL
Anion gap: 7 (ref 5–15)
BUN: 10 mg/dL (ref 8–23)
CO2: 30 mmol/L (ref 22–32)
Calcium: 8.5 mg/dL — ABNORMAL LOW (ref 8.9–10.3)
Chloride: 98 mmol/L (ref 98–111)
Creatinine, Ser: 0.51 mg/dL (ref 0.44–1.00)
GFR calc Af Amer: >60 mL/min (ref >60)
GFR calc non Af Amer: >60 mL/min (ref >60)
Glucose, Bld: 85 mg/dL (ref 70–99)
Potassium: 4.4 mmol/L (ref 3.5–5.1)
Sodium: 135 mmol/L (ref 135–145)

## 2020-02-15 LAB — CULTURE, BLOOD (ROUTINE X 2)
Culture: NO GROWTH
Culture: NO GROWTH
Special Requests: ADEQUATE

## 2020-02-15 MED ORDER — HYDROCODONE-HOMATROPINE 5-1.5 MG/5ML PO SYRP: 5.0000 mL | ORAL_SOLUTION | Freq: Four times a day (QID) | ORAL | 0 refills | Status: DC | PRN

## 2020-02-15 MED ORDER — ACETAMINOPHEN-CODEINE #3 300-30 MG PO TABS: 1.0000 | ORAL_TABLET | Freq: Four times a day (QID) | ORAL | 0 refills | Status: AC

## 2020-02-15 MED ORDER — LACTULOSE 10 GM/15ML PO SOLN
30.0000 g | Freq: Two times a day (BID) | ORAL | Status: DC
  Administered 2020-02-15: 30 g via ORAL
  Filled 2020-02-15: qty 45

## 2020-02-15 MED ORDER — LOSARTAN POTASSIUM 50 MG PO TABS: 50.0000 mg | ORAL_TABLET | Freq: Every day | ORAL | 1 refills | Status: DC

## 2020-02-15 MED ORDER — LOSARTAN POTASSIUM 25 MG PO TABS
25.0000 mg | ORAL_TABLET | Freq: Once | ORAL | Status: AC
  Administered 2020-02-15: 25 mg via ORAL
  Filled 2020-02-15: qty 1

## 2020-02-15 MED ORDER — AZITHROMYCIN 500 MG PO TABS: 500.0000 mg | ORAL_TABLET | Freq: Every day | ORAL | 0 refills | Status: AC

## 2020-02-15 MED ORDER — DM-GUAIFENESIN ER 30-600 MG PO TB12: 1.0000 | ORAL_TABLET | Freq: Two times a day (BID) | ORAL | 0 refills | Status: AC

## 2020-02-15 MED ORDER — BENZONATATE 100 MG PO CAPS: 200.0000 mg | ORAL_CAPSULE | Freq: Three times a day (TID) | ORAL | Status: DC

## 2020-02-15 MED ORDER — MELATONIN 5 MG PO TABS: 5.0000 mg | ORAL_TABLET | Freq: Every day | ORAL | 0 refills | Status: DC

## 2020-02-15 MED ORDER — POLYETHYLENE GLYCOL 3350 17 G PO PACK: 17.0000 g | PACK | Freq: Two times a day (BID) | ORAL | 1 refills | Status: DC

## 2020-02-15 MED ORDER — PREDNISONE 10 MG PO TABS: 10.0000 mg | ORAL_TABLET | Freq: Every day | ORAL | 0 refills | Status: DC

## 2020-02-15 MED ORDER — BENZONATATE 200 MG PO CAPS: 200.0000 mg | ORAL_CAPSULE | Freq: Three times a day (TID) | ORAL | 0 refills | Status: AC

## 2020-02-15 MED ORDER — SORBITOL 70 % SOLN
960.0000 mL | TOPICAL_OIL | Freq: Once | ORAL | Status: DC
  Filled 2020-02-15: qty 473

## 2020-02-15 MED ORDER — LOSARTAN POTASSIUM 50 MG PO TABS: 50.0000 mg | ORAL_TABLET | Freq: Every day | ORAL | Status: DC

## 2020-02-15 MED ORDER — LACTULOSE 10 GM/15ML PO SOLN: 30.0000 g | Freq: Two times a day (BID) | ORAL | 1 refills | Status: DC

## 2020-02-15 MED ORDER — SENNOSIDES-DOCUSATE SODIUM 8.6-50 MG PO TABS: 2.0000 | ORAL_TABLET | Freq: Two times a day (BID) | ORAL | 1 refills | Status: DC

## 2020-02-15 MED ORDER — CEFDINIR 300 MG PO CAPS: 300.0000 mg | ORAL_CAPSULE | Freq: Two times a day (BID) | ORAL | 0 refills | Status: AC

## 2020-02-15 MED ORDER — IPRATROPIUM-ALBUTEROL 0.5-2.5 (3) MG/3ML IN SOLN: 3.0000 mL | Freq: Three times a day (TID) | RESPIRATORY_TRACT | 1 refills | Status: DC

## 2020-02-15 NOTE — Progress Notes (Signed)
Physical Therapy Treatment Patient Details Name: LEMYA GREENWELL MRN: [id number] DOB: 01/14/1941 Today's Date: 02/15/2020    History of Present Illness 79 y.o. 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  General Gait Details: tolerated an increased distance with no rest break.  Also tolerated increased distance with no oxygen.  only slight dry cough after and only 1/4 dyspnea.  MUCH IMPROVED SATURATION QUALIFICATIONS: (This note is used to comply with regulatory documentation for home oxygen)  Patient Saturations on Room Air at Rest = 96%  Patient Saturations on Room Air while Ambulating 145 feet= 89% HR 77    Please briefly explain why patient needs home oxygen:  Pt did NOT require supplemental oxygen during activity.  Also present with improved dyspnea and not as much cough after  Pt hopes to go home today.    Follow Up Recommendations  Home health PT;Supervision for mobility/OOB     Equipment Recommendations  None recommended by PT    Recommendations for Other Services       Precautions / Restrictions Precautions Precautions: Fall Precaution Comments: monitor SpO2, O2 PRN at baseline    Mobility  Bed Mobility               General bed mobility comments: OOB in recliner  Transfers Overall transfer level: Needs assistance Equipment used: Rolling walker (2 wheeled) Transfers: Sit to/from [facility] Sit to Stand: Supervision Stand pivot transfers: Supervision;Min guard       General transfer comment: safety with turns also  assisted with a toilet transfer at Supervision level  Ambulation/Gait Ambulation/Gait assistance: Supervision Gait Distance (Feet): 175 Feet Assistive device: Rolling walker (2 wheeled) Gait Pattern/deviations: Step-through pattern;Decreased stride length Gait velocity: decreased   General Gait Details: tolerated an increased distance with no rest break.  Also tolerated increased distance with no oxygen.  only slight dry cough after and only 1/4 dyspnea.  MUCH IMPROVED   Stairs             Wheelchair Mobility    Modified Rankin (Stroke Patients Only)       Balance                                            Cognition Arousal/Alertness: Awake/alert Behavior During Therapy: WFL for tasks assessed/performed Overall Cognitive Status: Within Functional Limits for tasks assessed                                 General Comments: AxO x 3 pleasant, feeling better      Exercises      General Comments        Pertinent Vitals/Pain Pain Assessment: No/denies pain    Home Living                      Prior Function            PT Goals (current goals can now be found in the care plan  section) Progress towards PT goals: Progressing toward goals    Frequency    Min 3X/week      PT Plan Current plan remains appropriate    Co-evaluation              AM-PAC PT "6 Clicks" Mobility   Outcome Measure  Help needed turning from your back to your side while in a flat bed without using bedrails?: None Help needed moving from lying on your back to sitting on the side of a flat bed without using bedrails?: None Help needed moving to and from a bed to a chair (including a wheelchair)?: None Help needed standing up from a chair using your arms (e.g., wheelchair or bedside chair)?: A Little Help needed to walk in hospital room?: A Little Help needed climbing 3-5 steps with a railing? : A Little 6 Click Score: 21    End of  Session Equipment Utilized During Treatment: Gait belt Activity Tolerance: Patient tolerated treatment well Patient left: in chair;with call bell/phone within reach Nurse Communication: Mobility status PT Visit Diagnosis: Other abnormalities of gait and mobility (R26.89)     Time: 1040-1108 PT Time Calculation (min) (ACUTE ONLY): 28 min  Charges:  $Gait Training: 8-22 mins $Therapeutic Activity: 8-22 mins                     {Darell Saputo  PTA Acute  Rehabilitation Services Pager      [phone] Office      [phone]

## 2020-02-15 NOTE — Progress Notes (Signed)
Patient being dc in stable condition. No changes in previous assessment. Portable personal o2 tank is with patient- on room air 95% rr18 even and nonlabored. Dressed with minimal assist.

## 2020-02-15 NOTE — TOC Transition Note (Signed)
Transition of Care [facility]) - CM/SW Discharge Note   Patient Details  Name: Stacey [name] MRN: [id number] Date of Birth: [birth year]  Transition of Care [facility]) CM/SW Contact:  Dessa Phi, RN Phone Number: 02/15/2020, 9:31 AM   Clinical Narrative:d/c home today-patient informed of [facility] aware of d/c. Patient already has home 02(will have travel tan tank for home)-[facility]-home 02 ordered continuous-[facility] rep Zach aware of d/c. Noted 02 sats from 8/3.No further CM needs.         Barriers to Discharge: No Barriers Identified   Patient Goals and CMS Choice Patient states their goals for this hospitalization and ongoing recovery are:: to go home with therapy CMS Medicare.gov Compare Post Acute Care list provided to:: Patient Choice offered to / list presented to : Patient  Discharge Placement                       Discharge Plan and Services   Discharge Planning Services: CM Consult Post Acute Care Choice: Home Health          DME Arranged: N/A DME Agency: NA       HH Arranged: PT, OT HH Agency: [place] Date [place]: 02/13/20 Time [place]: 3736 Representative spoke with at [place]: [place] ([place]) Interventions     Readmission Risk Interventions No flowsheet data found.

## 2020-02-15 NOTE — Progress Notes (Signed)
PHYSICAL THERAPY  Much improved  SATURATION QUALIFICATIONS: (This note is used to comply with regulatory documentation for home oxygen)  Patient Saturations on Room Air at Rest = 96%  Patient Saturations on Room Air while Ambulating 145 feet= 89% HR 77    Please briefly explain why patient needs home oxygen:  Pt did NOT require supplemental oxygen during activity.  Also present with improved dyspnea and not as much cough after   Rica Koyanagi  PTA Acute  Rehabilitation Services Pager      [phone] Office      [phone]

## 2020-02-15 NOTE — Discharge Summary (Signed)
Physician Discharge Summary  Stacey [name] LNL:[id number] DOB: [birth year] DOA: 02/09/2020  PCP: Shon Baton, MD  Admit date: 02/09/2020 Discharge date: 02/15/2020  Time spent: 60 minutes  Recommendations for Outpatient Follow-up:  1. Follow-up with Shon Baton, MD in 2 weeks.  On follow-up patient's blood pressure need to be reassessed as patient's Cozaar has been increased to 50 mg daily.  Chronic constipation will also need to be followed up upon.  Patient also need a basic metabolic profile done to follow-up on electrolytes and renal function. 2. Follow-up with Geraldo Pitter, NP [facility] as scheduled on 02/22/2020.  On follow-up patient's interstitial lung disease will need to be reassessed as well as acute on chronic respiratory failure.   Discharge Diagnoses:  Principal Problem:   Acute on chronic respiratory failure with hypoxia (HCC) Active Problems:   Rheumatoid arthritis ([place])   Essential hypertension   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[place])   CAP (community acquired pneumonia)   Constipation   Discharge Condition: Stable and improved  Diet recommendation: Heart healthy  Filed Weights   02/11/20 1421  Weight: 74.1 kg    History of present illness:  HPI per Dr. Randel Books Stacey [name] is a 79 y.o. 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  She normally uses 3 L supplemental oxygen at home only as needed.  For the past few days, she has required oxygen consistently.  Denies fevers.  She has also been vomiting since yesterday.  Denies abdominal pain or diarrhea.  She recently traveled to [place].  Denies any calf pain or swelling.  States she had Covid last year and was later  vaccinated.  ED Course: Febrile with temperature 100.3 F.  Not tachycardic or hypotensive.  Oxygen saturation 85% on room air. ABG with pH 7.40, PCO2 43.3, and PO2 66.9.  Placed on 2 L supplemental oxygen.  Labs showing leukocytosis (WBC count 21.9).  SARS-CoV-2 PCR test negative.  Blood culture x2 pending.  Chest x-ray showing bibasilar hazy densities.  Patient received Tylenol, albuterol, Solu-Medrol 125 mg, vancomycin, and cefepime.  Hospital Course:  1 acute on chronic hypoxic respiratory failure secondary to pneumonia/ILD exacerbation On presentation patient noted to have sats of 85% on room air ABG with a PO2 2 of 66.9.  Patient on supplemental O2 2 L with sats 93 to 94%.  Chest x-ray done concerning for bibasilar hazy densities concerning for pneumonia.  Repeat chest x-ray with chronic interstitial lung disease with increased right upper lobe infiltrate.  Increased bibasilar markings since 2020 question progression of chronic interstitial lung disease versus superimposed atypical infection.  SARS Covid 2 PCR negative.  Patient pancultured with blood cultures with no growth to date.  Patient was on IV vancomycin IV cefepime and has been transitioned to oral azithromycin and oral Omnicef.    Patient improved clinically.  Patient initially received a dose of IV Solu-Medrol 125 mg daily which was subsequently transition to oral prednisone taper.  Patient placed on Mucinex DM, Tessalon Perles, Hycodan as needed for cough.  Patient improved clinically will be discharged home on 2 more days of oral antibiotics to complete a 7-day course of treatment.  Patient is ambulatory sats were checked and patient noted to be satting 96% on room air as well as 89%  on room air on ambulation by day of discharge.  Patient be discharged home back on home regimen of home O2 as needed.  Outpatient follow-up with pulmonary as previously scheduled.   2.  Severe constipation Patient stated had not had a bowel movement  in over a week.  Patient denies any abdominal pain.  Patient initially was placed on MiraLAX daily which was increased to twice daily, patient also started on lactulose, patient on Senokot S2 tablets twice daily.  Patient was given magnesium citrate as well as a smog enema with results noted with good bowel movement.  Patient be discharged home on a bowel regimen of MiraLAX twice daily, Senokot-S twice daily, lactulose twice daily.  Outpatient follow-up with PCP.  3.  Hypertension Patient was on Cozaar 25 mg daily however noted to have elevated blood pressures and as such Cozaar increased to 50 mg daily with improvement with blood pressure.  Outpatient follow-up with PCP.   4.  Gastroesophageal reflux disease Patient maintained on a PPI.  5.  Hyperlipidemia Patient maintained on statin.  6.  Rheumatoid arthritis Patient on chronic steroids.  Patient was placed on a steroid taper and will be discharged on a steroid taper and subsequently transition back to home regimen of Medrol once steroid taper has been completed.  Outpatient follow-up with her rheumatologist as scheduled.   7.  Cough Patient noted to still have a cough during the hospitalization while improving clinically was placed on Mucinex DM, Tessalon Perles increased to 200 mg 3 times daily, Hycodan as needed placed.patient's cough improved.  Patient be discharged home in stable and improved condition and will follow up with PCP/pulmonary in the outpatient setting.    Procedures:  Chest x-ray 02/12/2020, 02/09/2020  Consultations:  None  Discharge Exam: Vitals:   02/15/20 1335 02/15/20 1357  BP:  127/65  Pulse:  73  Resp: (!) 22 18  Temp:  98.9 F (37.2 C)  SpO2:  95%    General: NAD Cardiovascular: RRR Respiratory: Fine bibasilar crackles.  No wheezing.  Fair air movement.  No use of accessory muscles of respiration.    Discharge Instructions   Discharge Instructions    Diet - low sodium heart healthy    Complete by: As directed    Increase activity slowly   Complete by: As directed      Allergies as of 02/15/2020      Reactions   Penicillins Other (See Comments)   Reaction unknown occurred during childhood Has patient had a PCN reaction causing immediate rash, facial/tongue/throat swelling, SOB or lightheadedness with hypotension: Unknown Has patient had a PCN reaction causing severe rash involving mucus membranes or skin necrosis: Unknown Has patient had a PCN reaction that required hospitalization: No Has patient had a PCN reaction occurring within the last 10 years: No If all of the above answers are "NO", then may proceed with Cephalosporin use. Unsur   Remicade [infliximab] Other (See Comments)   Reaction unknown   Sulfa Antibiotics Other (See Comments)   Reaction unknown   Sulfamethoxazole Other (See Comments)   Unsure of reaction As a child not sure      Medication List    TAKE these medications   acetaminophen-codeine 300-30 MG tablet Commonly known as: TYLENOL #3 Take 1 tablet by mouth 4 (four) times daily for 5 days.   amitriptyline 25 MG tablet Commonly known as: ELAVIL Take 25 mg by mouth at bedtime.   azithromycin 500 MG tablet Commonly known as: ZITHROMAX Take 1  tablet (500 mg total) by mouth daily for 2 days. Start taking on: February 16, 2020   benzonatate 200 MG capsule Commonly known as: TESSALON Take 1 capsule (200 mg total) by mouth 3 (three) times daily for 5 days.   beta carotene w/minerals tablet Take 1 tablet by mouth daily.   Biotin 10 MG Tabs Take 1 tablet by mouth daily.   cefdinir 300 MG capsule Commonly known as: OMNICEF Take 1 capsule (300 mg total) by mouth every 12 (twelve) hours for 3 days.   cholecalciferol 1000 units tablet Commonly known as: VITAMIN D Take 1,000 Units by mouth daily.   dextromethorphan-guaiFENesin 30-600 MG 12hr tablet Commonly known as: MUCINEX DM Take 1 tablet by mouth 2 (two) times daily for 3 days.    gabapentin 300 MG capsule Commonly known as: NEURONTIN Take 600 mg by mouth at bedtime.   HYDROcodone-homatropine 5-1.5 MG/5ML syrup Commonly known as: HYCODAN Take 5 mLs by mouth every 6 (six) hours as needed for cough.   ipratropium-albuterol 0.5-2.5 (3) MG/3ML Soln Commonly known as: DUONEB Take 3 mLs by nebulization 3 (three) times daily for 5 days.   lactulose 10 GM/15ML solution Commonly known as: CHRONULAC Take 45 mLs (30 g total) by mouth 2 (two) times daily.   losartan 50 MG tablet Commonly known as: COZAAR Take 1 tablet (50 mg total) by mouth daily. Start taking on: February 16, 2020 What changed:   medication strength  how much to take   melatonin 5 MG Tabs Take 1 tablet (5 mg total) by mouth at bedtime.   methylPREDNISolone 4 MG tablet Commonly known as: MEDROL Take 1 tablet (4 mg total) by mouth every other day. Alternate with the 2mg  dose What changed: when to take this   mometasone-formoterol 100-5 MCG/ACT Aero Commonly known as: DULERA Inhale 2 puffs into the lungs 2 (two) times daily.   Ofev 100 MG Caps Generic drug: Nintedanib Take 1 capsule (100 mg total) by mouth 2 (two) times daily.   pantoprazole 40 MG tablet Commonly known as: PROTONIX Take 1 tablet (40 mg total) by mouth 2 (two) times daily before a meal.   polyethylene glycol 17 g packet Commonly known as: MIRALAX / GLYCOLAX Take 17 g by mouth 2 (two) times daily. What changed:   when to take this  reasons to take this   pravastatin 20 MG tablet Commonly known as: PRAVACHOL Take 1 tablet (20 mg total) by mouth every evening.   predniSONE 10 MG tablet Commonly known as: DELTASONE Take 1-4 tablets (10-40 mg total) by mouth daily with breakfast. Take 4 tablets (40mg ) daily x 1 week, then 3 tablets (30mg ) daily x 1 week, then 2 tablets (20mg ) daily x 1 week, then 1 tablet (10mg ) daily x 1 week, then back to home regimen medrol. Start taking on: February 16, 2020   Rituxan 100 MG/10ML  injection Generic drug: riTUXimab Inject into the vein. Receives at [facility] but unsure of dose - no more doses for next 6 months as of 09/14/18   senna-docusate 8.6-50 MG tablet Commonly known as: Senokot-S Take 2 tablets by mouth 2 (two) times daily.   venlafaxine XR 75 MG 24 hr capsule Commonly known as: EFFEXOR-XR Take 75 mg by mouth at bedtime.            Durable Medical Equipment  (From admission, onward)         Start     Ordered   02/13/20 1232  For home use only DME  oxygen  Once       Question Answer Comment  Length of Need Lifetime   Mode or (Route) Nasal cannula   Liters per Minute 4   Frequency Continuous (stationary and portable oxygen unit needed)   Oxygen delivery system Gas      02/13/20 1231         Allergies  Allergen Reactions  . Penicillins Other (See Comments)    Reaction unknown occurred during childhood Has patient had a PCN reaction causing immediate rash, facial/tongue/throat swelling, SOB or lightheadedness with hypotension: Unknown Has patient had a PCN reaction causing severe rash involving mucus membranes or skin necrosis: Unknown Has patient had a PCN reaction that required hospitalization: No Has patient had a PCN reaction occurring within the last 10 years: No If all of the above answers are "NO", then may proceed with Cephalosporin use.  Unsur  . Remicade [Infliximab] Other (See Comments)    Reaction unknown  . Sulfa Antibiotics Other (See Comments)    Reaction unknown  . Sulfamethoxazole Other (See Comments)    Unsure of reaction As a child not sure     Follow-up Information    [facility] Follow up.   Specialty: Home Health Services Why: agency will provide home health physical and occupational therapy. Contact information: [address] [place] [address] [phone]        [facility] Follow up.   Why: increased home requirement for oxygen. Contact information: [place] [address] [phone]        Shon Baton, MD. Schedule an appointment as soon as possible for a visit in 2 week(s).   Specialty: Internal Medicine Contact information: [place] [address] [phone]        Buford Dresser, MD .   Specialty: Cardiology Contact information: [address] [place] [address] [phone]        Martyn Ehrich, NP Follow up on 02/22/2020.   Specialty: Pulmonary Disease Why: f/u as scheduled at 1030am Contact information: [place] [address] [phone]                The results of significant diagnostics from this hospitalization (including imaging, microbiology, ancillary and laboratory) are listed below for reference.    Significant Diagnostic Studies: DG Chest 1 View  Result Date: 02/09/2020 CLINICAL DATA:  79 year old female with shortness of breath EXAM: CHEST  1 VIEW COMPARISON:  Chest radiograph dated 07/20/2019. FINDINGS: There is background of chronic interstitial coarsening. Bibasilar hazy densities concerning for atypical infection. Clinical correlation is recommended. Overall interval worsening of the right lung base density compared to the radiograph of 06/11/2019. Right apical subpleural calcification as seen on the CT of 07/03/2019. No large pleural effusion. There is no pneumothorax. The cardiac silhouette is within limits. Atherosclerotic calcification of the aorta. No acute osseous pathology. IMPRESSION: Bibasilar hazy densities concerning for atypical infection. Clinical correlation and follow-up is recommended. Electronically Signed   By: Anner Crete M.D.   On: 02/09/2020 19:12   DG CHEST PORT 1 VIEW  Result Date: 02/12/2020 CLINICAL DATA:  Community-acquired pneumonia, history interstitial lung disease, rheumatoid arthritis, GERD, asthma and bronchitis EXAM: PORTABLE CHEST 1 VIEW COMPARISON:  Portable exam 1012 hours  compared to 02/09/2020 and 06/11/2019 FINDINGS: Normal heart size, mediastinal contours, and pulmonary vascularity. Atherosclerotic calcification aorta. Chronic accentuation of interstitial markings more focal at the lung bases, slightly more prominent than  seen on 06/11/2019 question atypical infection versus progression of chronic interstitial disease. More focal opacity is identified in the RIGHT upper lobe slightly more prominent than on the previous exam question pneumonia. No pleural effusion or pneumothorax. Bones demineralized. IMPRESSION: Chronic interstitial lung disease with increased RIGHT upper lobe infiltrate. Increased bibasilar markings since 2020 question progression of chronic interstitial lung disease versus superimposed atypical infection. Electronically Signed   By: Lavonia Dana M.D.   On: 02/12/2020 10:28    Microbiology: Recent Results (from the past 240 hour(s))  SARS Coronavirus 2 by RT PCR (hospital order, performed in [facility] hospital lab) Nasopharyngeal Nasopharyngeal Swab     Status: None   Collection Time: 02/09/20  6:19 PM   Specimen: Nasopharyngeal Swab  Result Value Ref Range Status   SARS Coronavirus 2 NEGATIVE NEGATIVE Final    Comment: (NOTE) SARS-CoV-2 target nucleic acids are NOT DETECTED.  The SARS-CoV-2 RNA is generally detectable in upper and lower respiratory specimens during the acute phase of infection. The lowest concentration of SARS-CoV-2 viral copies this assay can detect is 250 copies / mL. A negative result does not preclude SARS-CoV-2 infection and should not be used as the sole basis for treatment or other patient management decisions.  A negative result may occur with improper specimen collection / handling, submission of specimen other than nasopharyngeal swab, presence of viral mutation(s) within the areas targeted by this assay, and inadequate number of viral copies (<250 copies / mL). A negative result must be combined with  clinical observations, patient history, and epidemiological information.  Fact Sheet for Patients:   StrictlyIdeas.no  Fact Sheet for Healthcare Providers: BankingDealers.co.za  This test is not yet approved or  cleared by the [place] FDA and has been authorized for detection and/or diagnosis of SARS-CoV-2 by FDA under an Emergency Use Authorization (EUA).  This EUA will remain in effect (meaning this test can be used) for the duration of the COVID-19 declaration under Section 564(b)(1) of the Act, 21 U.S.C. section 360bbb-3(b)(1), unless the authorization is terminated or revoked sooner.  Performed at [facility], [place] [address] [place], [place] [address]   Blood culture (routine x 2)     Status: None   Collection Time: 02/10/20 12:09 AM   Specimen: BLOOD LEFT FOREARM  Result Value Ref Range Status   Specimen Description   Final    BLOOD LEFT FOREARM Performed at [facility] [address] [place], [place] [address]    Special Requests   Final    BOTTLES DRAWN AEROBIC AND ANAEROBIC Blood Culture adequate volume Performed at [facility] [address], [place], [place] [address]    Culture   Final    NO GROWTH 5 DAYS Performed at [facility], [place] [address] [place], [place] [address]    Report Status 02/15/2020 FINAL  Final  Blood culture (routine x 2)     Status: None   Collection Time: 02/10/20 12:09 AM   Specimen: BLOOD  Result Value Ref Range Status   Specimen Description   Final    BLOOD RIGHT ANTECUBITAL Performed at [facility] [address] [place], [place] [address]    Special Requests   Final    BOTTLES DRAWN AEROBIC AND ANAEROBIC Blood Culture results may not be optimal due to an inadequate volume of blood received in culture bottles Performed at [facility] [address], [place], [place]  [address]  Culture   Final    NO GROWTH 5 DAYS Performed at [facility], [place] [address], [place], [place] [address]    Report Status 02/15/2020 FINAL  Final  MRSA PCR Screening     Status: None   Collection Time: 02/10/20  4:54 PM   Specimen: Nasopharyngeal  Result Value Ref Range Status   MRSA by PCR NEGATIVE NEGATIVE Final    Comment:        The GeneXpert MRSA Assay (FDA approved for NASAL specimens only), is one component of a comprehensive MRSA colonization surveillance program. It is not intended to diagnose MRSA infection nor to guide or monitor treatment for MRSA infections. Performed at [facility], [place] [address], [place], [place] [address]      Labs: Basic Metabolic Panel: Recent Labs  Lab 02/11/20 0750 02/12/20 0535 02/13/20 0552 02/14/20 0539 02/15/20 0524  NA 139 140 138 144 135  K 4.7 3.8 4.2 4.4 4.4  CL 106 104 100 103 98  CO2 25 27 30 28 30   GLUCOSE 96 90 95 87 85  BUN 16 17 13 14 10   CREATININE 0.62 0.57 0.51 0.56 0.51  CALCIUM 8.5* 8.5* 8.7* 9.0 8.5*  MG  --  2.2  --   --   --    Liver Function Tests: Recent Labs  Lab 02/09/20 1850  AST 25  ALT 15  ALKPHOS 50  BILITOT 0.6  PROT 6.6  ALBUMIN 3.6   Recent Labs  Lab 02/10/20 0009  LIPASE 21   No results for input(s): AMMONIA in the last 168 hours. CBC: Recent Labs  Lab 02/09/20 1850 02/09/20 1850 02/11/20 0750 02/12/20 0535 02/13/20 0552 02/14/20 0539 02/15/20 0524  WBC 21.9*   < > 21.7* 16.9* 12.8* 12.1* 11.3*  NEUTROABS 17.8*  --  17.7*  --   --   --   --   HGB 13.4   < > 11.5* 11.5* 11.1* 11.7* 11.3*  HCT 42.0   < > 36.6 37.0 35.5* 36.8 35.3*  MCV 97.2   < > 98.1 98.1 98.1 97.9 95.7  PLT 221   < > 204 235 206 225 250   < > = values in this interval not displayed.   Cardiac Enzymes: No results for input(s): CKTOTAL, CKMB, CKMBINDEX, TROPONINI in the last 168 hours. BNP: BNP (last 3 results) No results for input(s): BNP in the last 8760  hours.  ProBNP (last 3 results) No results for input(s): PROBNP in the last 8760 hours.  CBG: No results for input(s): GLUCAP in the last 168 hours.     Signed:  Irine Seal MD.  [facility] 02/15/2020, 3:10 PM

## 2020-02-15 NOTE — Plan of Care (Signed)

## 2020-02-16 ENCOUNTER — Ambulatory Visit: Payer: Medicare Other | Admitting: Primary Care

## 2020-02-16 ENCOUNTER — Ambulatory Visit: Payer: Medicare Other | Admitting: Adult Health

## 2020-02-20 DIAGNOSIS — M0579 Rheumatoid arthritis with rheumatoid factor of multiple sites without organ or systems involvement: Secondary | ICD-10-CM | POA: Diagnosis not present

## 2020-02-20 DIAGNOSIS — M8589 Other specified disorders of bone density and structure, multiple sites: Secondary | ICD-10-CM | POA: Diagnosis not present

## 2020-02-20 DIAGNOSIS — Z5181 Encounter for therapeutic drug level monitoring: Secondary | ICD-10-CM | POA: Diagnosis not present

## 2020-02-20 DIAGNOSIS — J849 Interstitial pulmonary disease, unspecified: Secondary | ICD-10-CM | POA: Diagnosis not present

## 2020-02-22 ENCOUNTER — Ambulatory Visit (INDEPENDENT_AMBULATORY_CARE_PROVIDER_SITE_OTHER): Payer: Medicare Other | Admitting: Primary Care

## 2020-02-22 ENCOUNTER — Other Ambulatory Visit: Payer: Self-pay

## 2020-02-22 ENCOUNTER — Other Ambulatory Visit: Payer: Self-pay | Admitting: *Deleted

## 2020-02-22 ENCOUNTER — Encounter: Payer: Self-pay | Admitting: Primary Care

## 2020-02-22 ENCOUNTER — Encounter: Payer: Self-pay | Admitting: *Deleted

## 2020-02-22 VITALS — BP 128/60 | HR 74 | Temp 97.4°F | Ht 66.0 in | Wt 140.6 lb

## 2020-02-22 DIAGNOSIS — J9611 Chronic respiratory failure with hypoxia: Secondary | ICD-10-CM | POA: Diagnosis not present

## 2020-02-22 DIAGNOSIS — J4540 Moderate persistent asthma, uncomplicated: Secondary | ICD-10-CM

## 2020-02-22 DIAGNOSIS — J849 Interstitial pulmonary disease, unspecified: Secondary | ICD-10-CM | POA: Diagnosis not present

## 2020-02-22 NOTE — Patient Outreach (Signed)
[facility]) Care Management [facility] CM Telephone Outreach, EMMI Red-Alert notification/ General Discharge PCP office completes Transition of Care follow up post-hospital discharge Post-hospital discharge # 7  02/22/2020  Stacey [name] [birth year] [id number]  EMMI Red-Alert notification/ General Discharge EMMI call date/ day #:  Tuesday 02/20/20; day # 4 Red-Alert reason(s):  "sad/ depressed/ anxious/ empty"  Successful telephone outreach to Stacey [name], 79 y/o female referred to [facility] RN CM 02/21/11 by [facility] CMA for [place] notification as above after patient experienced recent hospital visit July 30- February 15, 2020 for CAP/ acute on chronic respiratory failure; patient tested negative for COVID during most recent hospitalization; she was discharged home to self-care with home health services for PT/ OT through [place] home health agency.  Patient has history including, but not limited to, ILD/ asthma- on home O2 at baseline; COVID-19 in November 2020; arthritis; HTN; GERD; depression.  HIPAA/ identity verified and purpose of call/ [facility] CM services were discussed with patient; patient provides verbal consent to complete EMMI Red-Alert screening "for a few minutes," but declines ongoing [facility] CM participation, stating no care coordination/ management; disease management/ pharmacy/ community resource needs.  Patient clarifies that she is not at all feeling sad/ depressed/ anxious/ empty; emphatically states she "is blessed" and reports "no issues with depression."  Reports doing well post-hospital discharge and attended pulmonary provider office visit earlier today; states "got a good report," and confirms that she has finished her antibiotics and believes that she is back at her baseline.  She reports that she has had ILD and been on home O2 for "a long time," and states she is able to manage her care and is well versed in understanding and completing the things she needs to to  maintain her health.  No concerns were identified as a result of EMMI screening call, and I encouraged patient to contact me should she change her mind and decide to participate in [facility] CM program; she is very appreciative of the follow up call today.  Patient denies further issues, concerns, or problems today.  I provided/ confirmed that patient has my direct phone number, the main [facility] CM office phone number, and the [facility] CM 24-hour nurse advice phone number should issues arise in the future.   Plan:  Will make patient inactive with [facility] CM program, as she has declined [facility] CM services and no concerns were identified as result of screening call today; will make patient's PCP aware of same.  Oneta Rack, RN, BSN, [employer] [facility] Care Management  [phone]

## 2020-02-22 NOTE — Progress Notes (Signed)
@Patient  ID: Stacey [name], female    DOB: Jan 17, 1941, 79 y.o.   MRN: [id number]  Chief Complaint  Patient presents with   Follow-up    was in [place] 1 wk.,sob with exertion, no cough but hoarse    Referring provider: Shon Baton, MD  HPI: 79 year old female, never smoked. PMH significant for ILD, restrictive lung disease, chronic respiratory failure with hypoxia, Covid 19 (November 2020), GERD, HTN, RA. Patient of Dr. Chase Caller. Followed by rheumatology at [facility]. Maintained on Rituxan and Dulera 200. Due for spirometry with DLCO in July 2020.    Previous [facility] pulmonary encounter: 03/08/2019 Reports increased chronic cough and wheezing over the last week. Cough is congested with very little production. She has not tried mucinex. She is not currently using her flutter valve. She has been using Dulera 100 as prescribed twice daily. Using oxygen which seems to help. She has been taking her DUONEB nebulizer twice a day for the last week. She is on chronic 4mg  medrol. Denies fever. She will be getting testing for COVID prior to PFTs on September 14th.   07/20/2019 Patient presents today for regular office visit to discuss starting OFEV. Accompanied by her husband. She has questions regarding side effects of antifibrotic medication. She developed a cough 5 days ago after being hospitalized for fall where she sustained five rib fractures. She is getting up occasional mucus which is mostly clear with some green tinge. Shortness of breath is baseline. She has noticed some increased wheezing. She tested positive for COVID-19 on 11/18 and recently was admitted for covid pneumonia on 11/29 where she was treated with remdesivir/Decadron. CT chest showed new patchy airspace opacity in the LLL likely atelectasis/contusion and stable chronic fibrotic interstitial lung disease. No recent fevers.    08/22/2019 Productive cough with green mucus production x 10 days. Previous treated with abx and prednisone  which improved her symptoms. She felt better for 1 week after completing. Associated wheezing. She is taking dulera twice daily. She has not tried using albuterol. Started Ofev 100mg  three weeks ago. Reports some mild diarrhea and nausea for two days. She has a history of constipation and has not required imodium. Husband reports very little energy and chronic fatigue for a couple of weeks. No stamina. No known sick contacts. On wait list for covid vaccine. She is on 3L oxygen    02/22/2020 Recently admitted for pneumonia last week. She had temp 100.3. CXR showed bibasilar hazy densities. Covid negative. Treated with azithromycin and omnicef. She is feeling better. She has some voice hoarseness which is new but has improved some. She is not having any active shortness of breath, chest tightness, wheezing or cough. She uses oxygen prn which she hasn't needed to use it recently. She did use it the day before she was in the hospital. O2 100% RA today. Cozaar increased to 50mg  daily d/t high blood pressure. She is currently tolerating OFEV 100mg  twice day. She would like to hold off on pulmonary rehab, unable to fit into her life currently.  Denies weight loss. No significant diarrhea. Needs repeat liver function test, spirometry and DLCO and HRCT in 3 months.    Allergies  Allergen Reactions   Penicillins Other (See Comments)    Reaction unknown occurred during childhood Has patient had a PCN reaction causing immediate rash, facial/tongue/throat swelling, SOB or lightheadedness with hypotension: Unknown Has patient had a PCN reaction causing severe rash involving mucus membranes or skin necrosis: Unknown Has patient had  a PCN reaction that required hospitalization: No Has patient had a PCN reaction occurring within the last 10 years: No If all of the above answers are "NO", then may proceed with Cephalosporin use.  Unsur   Remicade [Infliximab] Other (See Comments)    Reaction unknown   Sulfa  Antibiotics Other (See Comments)    Reaction unknown   Sulfamethoxazole Other (See Comments)    Unsure of reaction As a child not sure     Immunization History  Administered Date(s) Administered   Influenza Split 04/12/2013, 04/12/2014, 03/14/2015, 04/23/2016   Influenza, High Dose Seasonal PF 04/12/2017, 03/24/2018, 04/13/2019   Influenza-Unspecified 04/03/2013, 04/22/2017   Moderna SARS-COVID-2 Vaccination 08/25/2019, 09/22/2019   Pneumococcal Conjugate-13 04/03/2013, 09/28/2014   Pneumococcal Polysaccharide-23 07/13/2012, 07/24/2013   Td 01/11/2018   Tdap 12/10/2017    Past Medical History:  Diagnosis Date   Abnormal finding of blood chemistry    Asthma    H/O measles    H/O varicella    Hypertension    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[place])    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  0   beta carotene w/minerals (OCUVITE) tablet Take 1 tablet by mouth daily.     Biotin 10 MG TABS Take 1 tablet by mouth daily.     cholecalciferol (VITAMIN D) 1000 UNITS tablet Take 1,000 Units by mouth daily.     gabapentin (NEURONTIN) 300 MG capsule Take 600 mg by mouth at bedtime.     losartan (COZAAR) 50 MG tablet Take 1 tablet (50 mg total) by mouth daily. 30 tablet 1   melatonin 5 MG TABS Take 1 tablet (5 mg total) by mouth at bedtime. 30 tablet 0   mometasone-formoterol (DULERA) 100-5 MCG/ACT AERO Inhale 2 puffs into the lungs 2 (two) times daily. 13 g 0   Nintedanib  (OFEV) 100 MG CAPS Take 1 capsule (100 mg total) by mouth 2 (two) times daily. 180 capsule 3   pantoprazole (PROTONIX) 40 MG tablet Take 1 tablet (40 mg total) by mouth 2 (two) times daily before a meal. 180 tablet 3   polyethylene glycol (MIRALAX / GLYCOLAX) 17 g packet Take 17 g by mouth 2 (two) times daily. 72 each 1   pravastatin (PRAVACHOL) 20 MG tablet Take 1 tablet (20 mg total) by mouth every evening. 90 tablet 3   predniSONE (DELTASONE) 10 MG tablet Take 1-4 tablets (10-40 mg total) by mouth daily with breakfast. Take 4 tablets (40mg ) daily x 1 week, then 3 tablets (30mg ) daily x 1 week, then 2 tablets (20mg ) daily x 1 week, then 1 tablet (10mg ) daily x 1 week, then back to home regimen medrol. 70 tablet 0   riTUXimab (RITUXAN) 100 MG/10ML injection Inject into the vein. [facility]ce[facility] but unsure of dose - no more doses for next 6 months as of 09/14/18     senna-docusate (SENOKOT-S) 8.6-50 MG tablet Take 2 tablets by mouth 2 (  two) times daily. 120 tablet 1   venlafaxine XR (EFFEXOR-XR) 75 MG 24 hr capsule Take 75 mg by mouth at bedtime.      HYDROcodone-homatropine (HYCODAN) 5-1.5 MG/5ML syrup Take 5 mLs by mouth every 6 (six) hours as needed for cough. 120 mL 0   ipratropium-albuterol (DUONEB) 0.5-2.5 (3) MG/3ML SOLN Take 3 mLs by nebulization 3 (three) times daily for 5 days. 45 mL 1   lactulose (CHRONULAC) 10 GM/15ML solution Take 45 mLs (30 g total) by mouth 2 (two) times daily. 1892 mL 1   methylPREDNISolone (MEDROL) 4 MG tablet Take 1 tablet (4 mg total) by mouth every other day. Alternate with the 2mg  dose     No facility-administered medications prior to visit.    Review of Systems  Review of Systems  Constitutional: Negative.   HENT:       Voice hoarseness  Respiratory: Negative for cough, shortness of breath and wheezing.     Physical Exam  BP 128/60 (BP Location: Left Arm, Cuff Size: Normal)    Pulse 74    Temp (!) 97.4 F (36.3 C) (Oral)    Ht 5\' 6"  (1.676  m)    Wt 140 lb 9.6 oz (63.8 kg)    SpO2 100%    BMI 22.69 kg/m  Physical Exam Constitutional:      Appearance: Normal appearance. She is normal weight.  Cardiovascular:     Rate and Rhythm: Normal rate and regular rhythm.  Pulmonary:     Breath sounds: Wheezing present.     Comments: Faint insp wheeze, no obvious rales/rhonchi Neurological:     Mental Status: She is alert.  Psychiatric:        Mood and Affect: Mood normal.        Behavior: Behavior normal.        Thought Content: Thought content normal.        Judgment: Judgment normal.      Lab Results:  CBC    Component Value Date/Time   WBC 11.3 (H) 02/15/2020 0524   RBC 3.69 (L) 02/15/2020 0524   HGB 11.3 (L) 02/15/2020 0524   HGB LA11 03/26/2018 0507   HCT 35.3 (L) 02/15/2020 0524   PLT 250 02/15/2020 0524   MCV 95.7 02/15/2020 0524   MCH 30.6 02/15/2020 0524   MCHC 32.0 02/15/2020 0524   RDW 13.9 02/15/2020 0524   LYMPHSABS 1.8 02/11/2020 0750   MONOABS 2.1 (H) 02/11/2020 0750   EOSABS 0.0 02/11/2020 0750   BASOSABS 0.0 02/11/2020 0750    BMET    Component Value Date/Time   NA 135 02/15/2020 0524   K 4.4 02/15/2020 0524   CL 98 02/15/2020 0524   CO2 30 02/15/2020 0524   GLUCOSE 85 02/15/2020 0524   BUN 10 02/15/2020 0524   CREATININE 0.51 02/15/2020 0524   CALCIUM 8.5 (L) 02/15/2020 0524   GFRNONAA >60 02/15/2020 0524   GFRAA >60 02/15/2020 0524    BNP    Component Value Date/Time   BNP 89.4 03/23/2018 0732    ProBNP    Component Value Date/Time   PROBNP 38.0 03/21/2018 1119    Imaging: DG Chest 1 View  Result Date: 02/09/2020 CLINICAL DATA:  79 year old female with shortness of breath EXAM: CHEST  1 VIEW COMPARISON:  Chest radiograph dated 07/20/2019. FINDINGS: There is background of chronic interstitial coarsening. Bibasilar hazy densities concerning for atypical infection. Clinical correlation is recommended. Overall interval worsening of the right lung base density compared to the  radiograph of 06/11/2019. Right apical subpleural calcification as seen on the CT of 07/03/2019. No large pleural effusion. There is no pneumothorax. The cardiac silhouette is within limits. Atherosclerotic calcification of the aorta. No acute osseous pathology. IMPRESSION: Bibasilar hazy densities concerning for atypical infection. Clinical correlation and follow-up is recommended. Electronically Signed   By: Anner Crete M.D.   On: 02/09/2020 19:12   DG CHEST PORT 1 VIEW  Result Date: 02/12/2020 CLINICAL DATA:  Community-acquired pneumonia, history interstitial lung disease, rheumatoid arthritis, GERD, asthma and bronchitis EXAM: PORTABLE CHEST 1 VIEW COMPARISON:  Portable exam 1012 hours compared to 02/09/2020 and 06/11/2019 FINDINGS: Normal heart size, mediastinal contours, and pulmonary vascularity. Atherosclerotic calcification aorta. Chronic accentuation of interstitial markings more focal at the lung bases, slightly more prominent than seen on 06/11/2019 question atypical infection versus progression of chronic interstitial disease. More focal opacity is identified in the RIGHT upper lobe slightly more prominent than on the previous exam question pneumonia. No pleural effusion or pneumothorax. Bones demineralized. IMPRESSION: Chronic interstitial lung disease with increased RIGHT upper lobe infiltrate. Increased bibasilar markings since 2020 question progression of chronic interstitial lung disease versus superimposed atypical infection. Electronically Signed   By: Lavonia Dana M.D.   On: 02/12/2020 10:28     Assessment & Plan:   ILD (interstitial lung disease) ([place]) - Patient is doing well, recently admitted in July for CAP/ ILD exacerbation but better. She is tolerating anti-fibrotic medication. LFTs wnl 02/09/20. Denies weight loss or diarrhea.  - Complete prednisone taper and then resume medrol - Plan: Finish OFEV 100mg  twice daily; then increase OFEV to 150mg  twice daily - Encourage  patient to use incentive spirometer twice daily - Patient unable to participate in pulmonary rehab at this time, recommend she research exercises   - HRCT scheduled for August 23rd  - Orders: Spirometry and DLCO in 6 weeks ; Liver function testing in 6 weeks  - Follow-up: 6 weeks with Dr. Chase Caller - 30 min slot ILD clinic (Labs prior to next visit)   Asthma - Continue Dulera 100 two puffs twice daily   Chronic respiratory failure with hypoxia ([place]) - Uses oxygen as needed, not currently requiring    Martyn Ehrich, NP 02/27/2020

## 2020-02-22 NOTE — Patient Instructions (Addendum)
  Pleasure seeing you today Ms Grade  Recommendations: Continue Dulera 2 puffs twice daily Complete prednisone taper and then resume medrol Finish OFEV 100mg  twice daily; then increase to 150mg  twice daily Use incentive spirometer twice daily every day  Research pulmonary rehab exercises   Orders: Spirometry and DLCO in 6 weeks ( if not already ordered) Liver function testing in 6 weeks  (if not already ordered)  Follow-up: 6 weeks with Dr. Chase Caller - 30 min slot ILD clinic  HRCT scheduled for August 23rd; Labs prior to next visit

## 2020-02-23 ENCOUNTER — Encounter: Payer: Self-pay | Admitting: Cardiology

## 2020-02-23 ENCOUNTER — Ambulatory Visit (INDEPENDENT_AMBULATORY_CARE_PROVIDER_SITE_OTHER): Payer: Medicare Other | Admitting: Cardiology

## 2020-02-23 VITALS — BP 130/68 | HR 74 | Temp 97.2°F | Ht 66.0 in | Wt 146.0 lb

## 2020-02-23 DIAGNOSIS — E785 Hyperlipidemia, unspecified: Secondary | ICD-10-CM | POA: Diagnosis not present

## 2020-02-23 DIAGNOSIS — J189 Pneumonia, unspecified organism: Secondary | ICD-10-CM | POA: Diagnosis not present

## 2020-02-23 DIAGNOSIS — N393 Stress incontinence (female) (male): Secondary | ICD-10-CM | POA: Diagnosis not present

## 2020-02-23 DIAGNOSIS — J9611 Chronic respiratory failure with hypoxia: Secondary | ICD-10-CM

## 2020-02-23 DIAGNOSIS — I2510 Atherosclerotic heart disease of native coronary artery without angina pectoris: Secondary | ICD-10-CM | POA: Diagnosis not present

## 2020-02-23 DIAGNOSIS — E7800 Pure hypercholesterolemia, unspecified: Secondary | ICD-10-CM

## 2020-02-23 DIAGNOSIS — Z7189 Other specified counseling: Secondary | ICD-10-CM | POA: Diagnosis not present

## 2020-02-23 DIAGNOSIS — J9691 Respiratory failure, unspecified with hypoxia: Secondary | ICD-10-CM | POA: Diagnosis not present

## 2020-02-23 DIAGNOSIS — I10 Essential (primary) hypertension: Secondary | ICD-10-CM

## 2020-02-23 DIAGNOSIS — M810 Age-related osteoporosis without current pathological fracture: Secondary | ICD-10-CM | POA: Diagnosis not present

## 2020-02-23 DIAGNOSIS — R197 Diarrhea, unspecified: Secondary | ICD-10-CM | POA: Diagnosis not present

## 2020-02-23 DIAGNOSIS — M0510 Rheumatoid lung disease with rheumatoid arthritis of unspecified site: Secondary | ICD-10-CM | POA: Diagnosis not present

## 2020-02-23 NOTE — Patient Instructions (Signed)

## 2020-02-23 NOTE — Progress Notes (Signed)
Cardiology Office Note:    Date:  02/23/2020   ID:  Stacey [name], DOB October 02, 1940, MRN [id number]  PCP:  Shon Baton, MD  Cardiologist:  Buford Dresser, MD PhD  Referring MD: Shon Baton, MD   CC: follow up  History of Present Illness:    Stacey [name] is a 79 y.o. 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  Cardiac history: She was admitted 12/2018 for shortness of breath and found to have elevated troponins. We had an extensive conversation during her admission with shared decision making regarding the elevation of her troponin. She preferred medical management over an invasive strategy. She had a rise and fall of troponins, with peak of 2.38 (old assay), but no chest pain. Echo without wall motion abnormalities. She received >48 hours of heparin and was started on aspirin and statin. Coronary calcium was seen on her CT, and we discussed high intensity statin. However, she was concerned about myalgias and preferred pravastatin.   Today: Reviewed recent admission, discharged on 02/15/20. Main issue was acute on chronic respiratory failure with hypoxia.  She tells me she was doing completely fine, had visitors in town. Woke up one morning and was completely wiped out. Couldn't breathe, felt nauseated. No fevers/chills at home, reported fever during hospitalization. Was coughing but not productive. Noted to be saturating 85% on room air. WBC elevated at 21.   Has completed her antibiotics, on a steroid taper to her typical medrol dose.  Blood pressure has been very irregular. Has been elevated while she was ill, losartan increased to 50 mg dose during that hospitalization. BP at home since discharge has been labile. She plans to get a new blood pressure cuff.   Left foot intermittently swollen, mild. Improves with elevation.   Past Medical History:  Diagnosis Date  .  Abnormal finding of blood chemistry   . Asthma   . H/O measles   . H/O varicella   . Hypertension   . 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[place])   . 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 Lichen sclerosus 47/42/59   Asymptomatic  . Low iron   . Mitral valve prolapse   . Osteoarthritis   . Osteoporosis   . Pneumonia   . Post herpetic neuralgia   . Rheumatoid arthritis(714.0)   . Yeast infection     Past Surgical History:  Procedure Laterality Date  . CHOLECYSTECTOMY  2011  . WISDOM TOOTH EXTRACTION      Current Medications: Current Outpatient Medications on File Prior to Visit  Medication Sig  . amitriptyline (ELAVIL) 25 MG tablet Take 25 mg by mouth at bedtime.  . beta carotene w/minerals (OCUVITE) tablet Take 1 tablet by mouth daily.  . Biotin 10 MG TABS Take 1 tablet by mouth daily.  . cholecalciferol (VITAMIN D) 1000 UNITS tablet Take 1,000 Units by mouth daily.  . Estradiol 10 MCG TABS vaginal tablet estradiol 10 mcg vaginal tablet  . gabapentin (NEURONTIN) 300 MG capsule Take 600 mg by mouth at bedtime.  Marland Kitchen HYDROcodone-homatropine (HYCODAN) 5-1.5 MG/5ML syrup Take 5 mLs by mouth every 6 (six) hours as needed for cough.  . lactulose (CHRONULAC) 10 GM/15ML solution Take 45 mLs (30 g total) by mouth 2 (two) times daily.  Marland Kitchen losartan (COZAAR) 50 MG tablet Take 1 tablet (50 mg total) by mouth daily.  . melatonin 5 MG TABS Take 1 tablet (5 mg total) by  mouth at bedtime.  . methylPREDNISolone (MEDROL) 4 MG tablet Take 1 tablet (4 mg total) by mouth every other day. Alternate with the 2mg  dose  . mometasone-formoterol (DULERA) 100-5 MCG/ACT AERO Inhale 2 puffs into the lungs 2 (two) times daily.  . Nintedanib (OFEV) 100 MG CAPS Take 1 capsule (100 mg total) by mouth 2 (two) times daily.  . pantoprazole (PROTONIX) 40 MG tablet Take 1 tablet (40 mg total) by mouth 2 (two) times daily before a meal.  . polyethylene glycol (MIRALAX / GLYCOLAX) 17 g packet Take 17 g by mouth 2 (two) times daily.  .  pravastatin (PRAVACHOL) 20 MG tablet Take 1 tablet (20 mg total) by mouth every evening.  . predniSONE (DELTASONE) 10 MG tablet Take 1-4 tablets (10-40 mg total) by mouth daily with breakfast. Take 4 tablets (40mg ) daily x 1 week, then 3 tablets (30mg ) daily x 1 week, then 2 tablets (20mg ) daily x 1 week, then 1 tablet (10mg ) daily x 1 week, then back to home regimen medrol.  . riTUXimab (RITUXAN) 100 MG/10ML injection Inject into the vein. Receives at [facility] but unsure of dose - no more doses for next 6 months as of 09/14/18  . senna-docusate (SENOKOT-S) 8.6-50 MG tablet Take 2 tablets by mouth 2 (two) times daily.  Marland Kitchen venlafaxine XR (EFFEXOR-XR) 75 MG 24 hr capsule Take 75 mg by mouth at bedtime.   Marland Kitchen ipratropium-albuterol (DUONEB) 0.5-2.5 (3) MG/3ML SOLN Take 3 mLs by nebulization 3 (three) times daily for 5 days.   No current facility-administered medications on file prior to visit.     Allergies:   Penicillins, Remicade [infliximab], Sulfa antibiotics, and Sulfamethoxazole   Social History   Tobacco Use  . Smoking status: Never Smoker  . Smokeless tobacco: Never Used  Vaping Use  . Vaping Use: Never used  Substance Use Topics  . Alcohol use: No  . Drug use: No    Family History: The patient's family history includes Anemia in her mother; Asthma in her mother; Breast cancer (age of onset: 50) in her maternal grandmother; COPD in her father; Polymyalgia rheumatica in her mother; Pulmonary fibrosis in her father.  ROS:   Please see the history of present illness.  Additional pertinent ROS otherwise unremarkable.  EKGs/Labs/Other Studies Reviewed:    The following studies were reviewed today: Echo 12/18/18 1. The left ventricle has normal systolic function with an ejection fraction of 60-65%. The cavity size was normal. There is mildly increased left ventricular wall thickness. Left ventricular diastolic Doppler parameters are consistent with impaired  relaxation. No evidence of left  ventricular regional wall motion abnormalities. 2. The right ventricle has normal systolic function. The cavity was normal. There is no increase in right ventricular wall thickness. Right ventricular systolic pressure normal with an estimated pressure of 27.8 mmHg. 3. The mitral valve is grossly normal. There is mild mitral annular calcification present. 4. The tricuspid valve is grossly normal. There is mild tricuspid regurgitation. 5. The aortic valve is tricuspid with moderate calcification and thickening of the noncoronary cusp. 6. The aortic root is normal in size and structure.   EKG:  EKG is personally reviewed.  The ekg ordered 02/09/20 demonstrates NSR  Recent Labs: 02/09/2020: ALT 15 02/12/2020: Magnesium 2.2 02/15/2020: BUN 10; Creatinine, Ser 0.51; Hemoglobin 11.3; Platelets 250; Potassium 4.4; Sodium 135  Recent Lipid Panel    Component Value Date/Time   CHOL 222 (H) 12/19/2018 1532   TRIG 79 06/11/2019 2338   HDL 59 12/19/2018  1532   CHOLHDL 3.8 12/19/2018 1532   VLDL 26 12/19/2018 1532   LDLCALC 137 (H) 12/19/2018 1532    Physical Exam:    VS:  BP 130/68   Pulse 74   Temp (!) 97.2 F (36.2 C)   Ht 5\' 6"  (1.676 m)   Wt 146 lb (66.2 kg)   BMI 23.57 kg/m     Wt Readings from Last 3 Encounters:  02/23/20 146 lb (66.2 kg)  02/22/20 140 lb 9.6 oz (63.8 kg)  02/11/20 163 lb 5.8 oz (74.1 kg)   GEN: Well nourished, well developed in no acute distress HEENT: Normal, moist mucous membranes NECK: No JVD CARDIAC: regular rhythm, normal S1 and S2, no rubs or gallops. No murmur. VASCULAR: Radial and DP pulses 2+ bilaterally. No carotid bruits RESPIRATORY:  Clear to auscultation without rales, wheezing or rhonchi  ABDOMEN: Soft, non-tender, non-distended MUSCULOSKELETAL:  Ambulates independently SKIN: Warm and dry, no edema NEUROLOGIC:  Alert and oriented x 3. No focal neuro deficits noted. PSYCHIATRIC:  Normal affect   ASSESSMENT:    1. Coronary artery  calcification seen on CAT scan   2. Chronic respiratory failure with hypoxia (HCC)   3. Pure hypercholesterolemia   4. Essential hypertension   5. Cardiac risk counseling   6. Counseling on health promotion and disease prevention    PLAN:    Recent hospitalization for acute on chronic respiratory failure with hypoxia, discharged 02/15/20: -reviewed hospital course and discharge summary  Coronary calcium on CT: suggests she may have underlying CAD, recommend aggressive prevention  Hyperlipidemia:  -see prior discussions re: high intensity statins -continue pravastatin 20 mg nightly per patient preference   Hypertension: well controlled today -continue losartan  Cardiac risk counseling and prevention recommendations: -recommend heart healthy/Mediterranean diet, with whole grains, fruits, vegetable, fish, lean meats, nuts, and olive oil. Limit salt. -recommend moderate walking, 3-5 times/week for 30-50 minutes each session. Aim for at least 150 minutes.week. Goal should be pace of 3 miles/hours, or walking 1.5 miles in 30 minutes -recommend avoidance of tobacco products. Avoid excess alcohol. -ASCVD risk score: The ASCVD Risk score Mikey Bussing DC Jr., et al., 2013) failed to calculate for the following reasons:   The 2013 ASCVD risk score is only valid for ages 34 to 77    Plan for follow up: 6 mos or sooner PRN  Medication Adjustments/Labs and Tests Ordered: Current medicines are reviewed at length with the patient today.  Concerns regarding medicines are outlined above.  No orders of the defined types were placed in this encounter.  No orders of the defined types were placed in this encounter.   Patient Instructions  Medication Instructions:  Your Physician recommend you continue on your current medication as directed.    *If you need a refill on your cardiac medications before your next appointment, please call your pharmacy*   Lab  Work: None   Testing/Procedures: None   Follow-Up: At [facility], you and your health needs are our priority.  As part of our continuing mission to provide you with exceptional heart care, we have created designated Provider Care Teams.  These Care Teams include your primary Cardiologist (physician) and Advanced Practice Providers (APPs -  Physician Assistants and Nurse Practitioners) who all work together to provide you with the care you need, when you need it.  We recommend signing up for the patient portal called "MyChart".  Sign up information is provided on this After Visit Summary.  MyChart is used to connect with  patients for Virtual Visits (Telemedicine).  Patients are able to view lab/test results, encounter notes, upcoming appointments, etc.  Non-urgent messages can be sent to your provider as well.   To learn more about what you can do with MyChart, go to [email or web address].    Your next appointment:   6 month(s)  The format for your next appointment:   In Person  Provider:   Buford Dresser, MD    Signed, Buford Dresser, MD PhD 02/23/2020   [place]

## 2020-02-27 NOTE — Assessment & Plan Note (Signed)
-   Uses oxygen as needed, not currently requiring

## 2020-02-27 NOTE — Assessment & Plan Note (Addendum)
-   Continue Dulera 100 two puffs twice daily

## 2020-02-27 NOTE — Assessment & Plan Note (Addendum)
-   Patient is doing well, recently admitted in July for CAP/ ILD exacerbation but better. She is tolerating anti-fibrotic medication. LFTs wnl 02/09/20. Denies weight loss or diarrhea.  - Complete prednisone taper and then resume medrol - Plan: Finish OFEV 100mg  twice daily; then increase OFEV to 150mg  twice daily - Encourage patient to use incentive spirometer twice daily - Patient unable to participate in pulmonary rehab at this time, recommend she research exercises   - HRCT scheduled for August 23rd  - Orders: Spirometry and DLCO in 6 weeks ; Liver function testing in 6 weeks  - Follow-up: 6 weeks with Dr. Chase Caller - 30 min slot ILD clinic (Labs prior to next visit)

## 2020-03-04 ENCOUNTER — Ambulatory Visit
Admission: RE | Admit: 2020-03-04 | Discharge: 2020-03-04 | Disposition: A | Discharge status: Home / Self Care | Payer: Medicare Other | Source: Ambulatory Visit | Attending: Internal Medicine | Admitting: Internal Medicine

## 2020-03-04 DIAGNOSIS — J984 Other disorders of lung: Secondary | ICD-10-CM | POA: Diagnosis not present

## 2020-03-04 DIAGNOSIS — J8489 Other specified interstitial pulmonary diseases: Secondary | ICD-10-CM | POA: Diagnosis not present

## 2020-03-04 DIAGNOSIS — I700 Atherosclerosis of aorta: Secondary | ICD-10-CM | POA: Diagnosis not present

## 2020-03-04 DIAGNOSIS — I2510 Atherosclerotic heart disease of native coronary artery without angina pectoris: Secondary | ICD-10-CM | POA: Diagnosis not present

## 2020-03-04 DIAGNOSIS — J849 Interstitial pulmonary disease, unspecified: Secondary | ICD-10-CM

## 2020-03-04 NOTE — Progress Notes (Signed)
CT with ground glass inflammation. Probably reflective of august admission. If she is feeling better - continue advise from Derl Barrow recent office visit. Will see her end sept  Xxxx CT Chest High Resolution  Result Date: 03/04/2020 CLINICAL DATA:  Interstitial lung disease.  Pneumonia August 2021. EXAM: CT CHEST WITHOUT CONTRAST TECHNIQUE: Multidetector CT imaging of the chest was performed following the standard protocol without intravenous contrast. High resolution imaging of the lungs, as well as inspiratory and expiratory imaging, was performed. COMPARISON:  07/03/2019. FINDINGS: Cardiovascular: Aberrant right subclavian artery. Atherosclerotic calcification of the aorta, aortic valve and coronary arteries. Heart is enlarged. Small amount of pericardial fluid is likely physiologic. Mediastinum/Nodes: No pathologically enlarged mediastinal or axillary lymph nodes. Hilar regions are difficult to definitively evaluate without IV contrast. Esophagus is unremarkable. Lungs/Pleura: Biapical pleuroparenchymal scarring. Patchy and largely peripheral peribronchovascular ground-glass, increased from 07/03/2019. Underlying subpleural reticulation, traction bronchiectasis/bronchiolectasis and scattered honeycombing, similar to minimally increased from 07/03/2019. Calcified granulomas. No pleural fluid. Airway is unremarkable. No air trapping. Upper Abdomen: Visualized portions of the liver, adrenal glands, kidneys, spleen, pancreas, stomach and bowel are unremarkable with the exception of a small hiatal hernia. Cholecystectomy. Calcified upper abdominal lymph nodes. Musculoskeletal: Degenerative changes in the spine. No worrisome lytic or sclerotic lesions. IMPRESSION: 1. Increased patchy pulmonary parenchymal ground-glass may be due to an atypical/viral pneumonia, including due to COVID-19. 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 2. Aortic atherosclerosis (ICD10-I70.0). Coronary artery calcification. Electronically Signed   By: Lorin Picket M.D.   On: 03/04/2020 12:58

## 2020-03-05 NOTE — Progress Notes (Signed)
Called and spoke with patient about CT result per Dr Chase Caller. All questions answered and patient expressed full understanding. Nothing further needed at this time.

## 2020-03-07 DIAGNOSIS — Z23 Encounter for immunization: Secondary | ICD-10-CM | POA: Diagnosis not present

## 2020-03-14 DIAGNOSIS — I10 Essential (primary) hypertension: Secondary | ICD-10-CM | POA: Diagnosis not present

## 2020-03-14 DIAGNOSIS — E785 Hyperlipidemia, unspecified: Secondary | ICD-10-CM | POA: Diagnosis not present

## 2020-03-14 DIAGNOSIS — H532 Diplopia: Secondary | ICD-10-CM | POA: Diagnosis not present

## 2020-03-14 DIAGNOSIS — R42 Dizziness and giddiness: Secondary | ICD-10-CM | POA: Diagnosis not present

## 2020-03-19 ENCOUNTER — Ambulatory Visit
Admission: RE | Admit: 2020-03-19 | Discharge: 2020-03-19 | Disposition: A | Discharge status: Home / Self Care | Payer: Medicare Other | Source: Ambulatory Visit | Attending: Adult Health | Admitting: Adult Health

## 2020-03-19 ENCOUNTER — Other Ambulatory Visit: Payer: Self-pay | Admitting: Adult Health

## 2020-03-19 ENCOUNTER — Other Ambulatory Visit: Payer: Self-pay

## 2020-03-19 DIAGNOSIS — I708 Atherosclerosis of other arteries: Secondary | ICD-10-CM | POA: Diagnosis not present

## 2020-03-19 DIAGNOSIS — I6602 Occlusion and stenosis of left middle cerebral artery: Secondary | ICD-10-CM | POA: Diagnosis not present

## 2020-03-19 DIAGNOSIS — I10 Essential (primary) hypertension: Secondary | ICD-10-CM

## 2020-03-19 DIAGNOSIS — G9389 Other specified disorders of brain: Secondary | ICD-10-CM | POA: Diagnosis not present

## 2020-03-19 DIAGNOSIS — I771 Stricture of artery: Secondary | ICD-10-CM | POA: Diagnosis not present

## 2020-03-19 DIAGNOSIS — Z043 Encounter for examination and observation following other accident: Secondary | ICD-10-CM | POA: Diagnosis not present

## 2020-03-19 MED ORDER — IOPAMIDOL (ISOVUE-370) INJECTION 76%
75.0000 mL | Freq: Once | INTRAVENOUS | Status: AC | PRN
  Administered 2020-03-19: 75 mL via INTRAVENOUS

## 2020-03-27 ENCOUNTER — Encounter: Payer: Self-pay | Admitting: Cardiology

## 2020-03-29 ENCOUNTER — Telehealth: Payer: Self-pay | Admitting: Internal Medicine

## 2020-03-29 NOTE — Telephone Encounter (Signed)
pt states that she is having problems reaching the mail order pharmacy to let the know that Dr. Chase Caller is upping the Ofev - but then stated that she didn't care about upping the Ofev, she just wants the RX - states that she normally has the shipment received 1-2 weeks before she runs out - only has 5 days worth - CB# [phone]

## 2020-03-29 NOTE — Telephone Encounter (Signed)
Spoke with the pt  She states she already spoke with the pharmacy and she is getting her shipment of her medication tomorrow  Nothing further needed

## 2020-04-02 ENCOUNTER — Ambulatory Visit (INDEPENDENT_AMBULATORY_CARE_PROVIDER_SITE_OTHER): Payer: Medicare Other | Admitting: Neurology

## 2020-04-02 ENCOUNTER — Encounter: Payer: Self-pay | Admitting: Neurology

## 2020-04-02 VITALS — BP 146/82 | HR 70 | Ht 66.0 in | Wt 142.3 lb

## 2020-04-02 DIAGNOSIS — I679 Cerebrovascular disease, unspecified: Secondary | ICD-10-CM | POA: Diagnosis not present

## 2020-04-02 DIAGNOSIS — R42 Dizziness and giddiness: Secondary | ICD-10-CM

## 2020-04-02 DIAGNOSIS — R296 Repeated falls: Secondary | ICD-10-CM | POA: Diagnosis not present

## 2020-04-02 DIAGNOSIS — G9389 Other specified disorders of brain: Secondary | ICD-10-CM | POA: Diagnosis not present

## 2020-04-02 DIAGNOSIS — I2510 Atherosclerotic heart disease of native coronary artery without angina pectoris: Secondary | ICD-10-CM

## 2020-04-02 DIAGNOSIS — R4789 Other speech disturbances: Secondary | ICD-10-CM | POA: Diagnosis not present

## 2020-04-02 DIAGNOSIS — R2689 Other abnormalities of gait and mobility: Secondary | ICD-10-CM

## 2020-04-02 NOTE — Progress Notes (Signed)
Subjective:    Patient ID: Stacey [name] is a 79 y.o. female.  HPI     Star Age, MD, PhD [facility] [address] [place], [place] [address]  Dear Dr. 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  The patient is unaccompanied today.  As you know, Stacey [name] is an 79 year old right-handed woman with an underlying complex medical history of hyperlipidemia, allergies, postherpetic neuralgia, osteoporosis, interstitial lung disease, history of Covid pneumonia in December 2020, fall with history of rib fractures in December 2020, rheumatoid arthritis, and hospitalization in August 2021 for acute on chronic respiratory failure with hypoxia, who reports an approximately 2-year history of worsening balance.  She has had falls.  Of note, she most recently fell about 6 weeks ago, she fell over a table and off the lamp off the table.  Thankfully she did not injure herself.  She denies any lightheadedness upon standing but sometimes it feels like she has spinning sensation.  Denies any nausea or vomiting.  She just feels off balance and dizzy.  She has no family history of Parkinson's disease or tremors.  She has not noticed a tremor but is noted to have a head and voice tremor today on exam.  She has an oxygen concentrator and is supposed to use oxygen as needed.  She did not bring any oxygen today but but did have some noisy breathing and wheezing on examination.  She denies any presyncopal symptoms.  She denies any recent chest pain or shortness of breath.  She is followed by cardiology and pulmonology.  She is also followed by rheumatology.  Of note, she is on multiple medications.  She is also on potentially sedating medications including gabapentin 600 mg at bedtime, amitriptyline 25 mg at bedtime  and she takes Effexor long-acting 75 mg at bedtime.   I have previously evaluated her for paresthesias. I reviewed your office note from 03/14/2020. She had a recent CT angiogram head and neck with and without contrast on 03/19/2020 and I reviewed the results: IMPRESSION: 1. 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 2. Moderate narrowing of a proximal left M2 MCA branch. 3. Otherwise, no significant focal stenosis or occlusion in the neck or head. Diminutive basilar artery and hypoplastic or absent P1 PCAs is favored to reflect anatomic variation given prominent bilateral fetal type posterior communicating arteries. 4. Diffuse ventriculomegaly (similar when compared to 2017), which is slightly out of proportion to the degree of cerebral volume loss. While this could be ex vacuo in etiology, this finding can be seen with normal pressure hydrocephalus in the correct clinical setting. No periventricular edema. 5. Multilevel degenerative changes of the cervical spine, greatest at C5-C6 and C6-C7 where there is at least moderate canal stenosis. MRI of the cervical spine could better characterize the canal/cord, if clinically indicated. 6. Areas of ground-glass opacity with peripheral reticulation in the imaged upper lungs, better characterized on recent CT chest from 03/04/2020.  Previously:   09/10/15: [name] is a 79 year old right-handed woman with an underlying medical history of pulmonary nodules and cough, low iron, rheumatoid arthritis, osteoporosis, leg edema, mitral valve prolapse, hyperlipidemia, history of varicella and measles, who presents for follow-up consultation of her left arm numbness and tingling. The patient  is unaccompanied today. I first met her on 08/20/2015 at the request of her primary care physician, at which time she reported a 3-4 week history of  intermittent left arm numbness and tingling. Her exam was fairly benign. I suggested a cervical spine and brain MRI, EMG and nerve conduction testing and blood work. Blood work was benign. She had a normal CK level, hemoglobin A1c was in the prediabetes range. We called her with her test results.    She had a brain MRI without contrast on 08/27/2015:  IMPRESSION:  Abnormal MRI scan of the brain showing mild degree of supratentorial cortical atrophy and diffuse ventriculomegaly which is slightly out of proportion. No acute abnormalities noted.   She had a cervical spine MRI without contrast on 08/28/2015:IMPRESSION:  Abnormal MRI scan of the cervical spine showing prominent multilevel spondylytic changes most prominent at C6-7 where there is a large disc osteophyte complex eccentrically to the left with displacement, flattening of the cord with canal stenosis and moderate left-sided foraminal narrowing. At C5-6 also there is diffuse osteophyte complex causing mild canal and severe left-sided foraminal narrowing. At C4-5 there is prominent left paracentral disc osteophyte protrusion resulting in displacement and indentation of the left hemicord and mild canal stenosis. Overall these changes appear to have progressed slightly compared with previous MRI scan dated 06/08/2009. In addition, personally reviewed the images through the PACS system.    We called her with her test results. I suggested referring her to a neurosurgeon. She was agreeable. We sent a referral to [place] [facility]. She had an upper extremity EMG and nerve conduction test on 09/02/2015: IMPRESSION: This is a normal study. No electrodiagnostic evidence for neuropathy of cervical radiculopathy at this time.   We called her with her test results.   Today, 09/10/2015: She reports no new symptoms. She saw Dr. Dayton Bailiff on 09/02/2015. I have requested his office notes. They have not been sent as yet. We will review his records when  available. She reports intermittent tingling sensations in the left arm. It sounds like they discussed possible epidural steroid injections. She says she can live with the symptoms at this time. We discussed all her test results today.   08/20/2015: She reports intermittent left arm numbness and tingling for the past 3 to 4 weeks, non progressive, with fairly abrupt onset. No CP/SOB/diaphoresis/HA/Neck pain. Mild discomfort with it, no actual pain, maybe mild weakness, lasts 2 minutes, no spread, no triggers or alleviating factors, no recent injuries or falls. The tingling seems to involve the entire left arm, starting right at the shoulder joints, does not seem to radiate necessarily from the neck and does not seem to be associated with neck movements or certain arm movements. I reviewed your office note from 07/31/15, which you kindly included. Recent blood work through your office was reviewed from 03/22/2015. This includes a vitamin D level at 36, urinalysis negative, CBC with platelets was negative, CMP unremarkable, lipid profile showed total cholesterol of 240, LDL of 156, both elevated and triglycerides at 128. She drinks caffeine in the form of coffee, one cup every day at 1 soda per day. She does not always drink enough water she admits. She drinks very little alcohol.   Her Past Medical History Is Significant For: Past Medical History:  Diagnosis Date  . Abnormal finding of blood chemistry   . Asthma   . H/O measles   . H/O varicella   . Hypertension   . 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[place])   .  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 Lichen sclerosus 48/01/65   Asymptomatic  . Low iron   . Mitral valve prolapse   . Osteoarthritis   . Osteoporosis   . Pneumonia   . Post herpetic neuralgia   . Rheumatoid arthritis(714.0)   . Yeast infection     Her Past Surgical History Is Significant For: Past Surgical History:  Procedure Laterality Date  . CHOLECYSTECTOMY  2011  . WISDOM TOOTH  EXTRACTION      Her Family History Is Significant For: Family History  Problem Relation Age of Onset  . Asthma Mother   . Anemia Mother   . Polymyalgia rheumatica Mother   . COPD Father   . Pulmonary fibrosis Father   . Breast cancer Maternal Grandmother 88    Her Social History Is Significant For: Social History   Socioeconomic History  . Marital status: Married    Spouse name: Not on file  . Number of children: 2  . Years of education: BA  . Highest education level: Not on file  Occupational History  . Occupation: Retired  Tobacco Use  . Smoking status: Never Smoker  . Smokeless tobacco: Never Used  Vaping Use  . Vaping Use: Never used  Substance and Sexual Activity  . Alcohol use: No  . Drug use: No  . Sexual activity: Yes  Other Topics Concern  . Not on file  Social History Narrative   Drinks 1 cup of coffee and 1 diet coke a day    Social Determinants of Health   Financial Resource Strain:   . Difficulty of Paying Living Expenses: Not on file  Food Insecurity: No Food Insecurity  . Worried About Charity fundraiser in the Last Year: Never true  . Ran Out of Food in the Last Year: Never true  Transportation Needs: No Transportation Needs  . Lack of Transportation (Medical): No  . Lack of Transportation (Non-Medical): No  Physical Activity:   . Days of Exercise per Week: Not on file  . Minutes of Exercise per Session: Not on file  Stress:   . Feeling of Stress : Not on file  Social Connections:   . Frequency of Communication with Friends and Family: Not on file  . Frequency of Social Gatherings with Friends and Family: Not on file  . Attends Religious Services: Not on file  . Active Member of Clubs or Organizations: Not on file  . Attends Archivist Meetings: Not on file  . Marital Status: Not on file    Her Allergies Are:  Allergies  Allergen Reactions  . Penicillins Other (See Comments)    Reaction unknown occurred during  childhood Has patient had a PCN reaction causing immediate rash, facial/tongue/throat swelling, SOB or lightheadedness with hypotension: Unknown Has patient had a PCN reaction causing severe rash involving mucus membranes or skin necrosis: Unknown Has patient had a PCN reaction that required hospitalization: No Has patient had a PCN reaction occurring within the last 10 years: No If all of the above answers are "NO", then may proceed with Cephalosporin use.  Unsur  . Remicade [Infliximab] Other (See Comments)    Reaction unknown  . Sulfa Antibiotics Other (See Comments)    Reaction unknown  . Sulfamethoxazole Other (See Comments)    Unsure of reaction As a child not sure   :   Her Current Medications Are:  Outpatient Encounter Medications as of 04/02/2020  Medication Sig  . amitriptyline (ELAVIL) 25 MG tablet Take 25  mg by mouth at bedtime.  . beta carotene w/minerals (OCUVITE) tablet Take 1 tablet by mouth daily.  . Biotin 10 MG TABS Take 1 tablet by mouth daily.  . cholecalciferol (VITAMIN D) 1000 UNITS tablet Take 1,000 Units by mouth daily.  . Estradiol 10 MCG TABS vaginal tablet estradiol 10 mcg vaginal tablet  . gabapentin (NEURONTIN) 300 MG capsule Take 600 mg by mouth at bedtime.  Marland Kitchen HYDROcodone-homatropine (HYCODAN) 5-1.5 MG/5ML syrup Take 5 mLs by mouth every 6 (six) hours as needed for cough.  . lactulose (CHRONULAC) 10 GM/15ML solution Take 45 mLs (30 g total) by mouth 2 (two) times daily. (Patient taking differently: Take 30 g by mouth as needed. )  . losartan (COZAAR) 50 MG tablet Take 1 tablet (50 mg total) by mouth daily.  . melatonin 5 MG TABS Take 1 tablet (5 mg total) by mouth at bedtime.  . methylPREDNISolone (MEDROL) 4 MG tablet Take 1 tablet (4 mg total) by mouth every other day. Alternate with the 12m dose  . mometasone-formoterol (DULERA) 100-5 MCG/ACT AERO Inhale 2 puffs into the lungs 2 (two) times daily.  . Nintedanib (OFEV) 100 MG CAPS Take 1 capsule (100 mg  total) by mouth 2 (two) times daily.  . pantoprazole (PROTONIX) 40 MG tablet Take 1 tablet (40 mg total) by mouth 2 (two) times daily before a meal.  . polyethylene glycol (MIRALAX / GLYCOLAX) 17 g packet Take 17 g by mouth 2 (two) times daily. (Patient taking differently: Take 17 g by mouth as needed. )  . pravastatin (PRAVACHOL) 20 MG tablet Take 1 tablet (20 mg total) by mouth every evening.  . riTUXimab (RITUXAN) 100 MG/10ML injection Inject into the vein. [facility] of [facility] - no more doses for next 6 months as of 09/14/18  . senna-docusate (SENOKOT-S) 8.6-50 MG tablet Take 2 tablets by mouth 2 (two) times daily. (Patient taking differently: Take 2 tablets by mouth as needed. )  . venlafaxine XR (EFFEXOR-XR) 75 MG 24 hr capsule Take 75 mg by mouth at bedtime.   .Marland Kitchenipratropium-albuterol (DUONEB) 0.5-2.5 (3) MG/3ML SOLN Take 3 mLs by nebulization 3 (three) times daily for 5 days. (Patient taking differently: Take 3 mLs by nebulization as needed. )  . [DISCONTINUED] predniSONE (DELTASONE) 10 MG tablet Take 1-4 tablets (10-40 mg total) by mouth daily with breakfast. Take 4 tablets (428m daily x 1 week, then 3 tablets (3066mdaily x 1 week, then 2 tablets (68m48maily x 1 week, then 1 tablet (10mg69mily x 1 week, then back to home regimen medrol.   No facility-administered encounter medications on file as of 04/02/2020.  :   Review of Systems:  Out of a complete 14 point review of systems, all are reviewed and negative with the exception of these symptoms as listed below:    Review of Systems  Neurological:       Last visit was in 2017. Pt reports decrease in balance over the last few months.  Pt reports she had a CT scan on 03/19/2020 and would like to discuss findings. PCP wanted to r/u NPH.      Objective:  Neurological Exam  Physical Exam Physical Examination:   Vitals:   04/02/20 1027  BP: (!) 146/82  Pulse: 70  SpO2: 99%   General Examination: The patient is a  very pleasant 79 y.74 female in no acute distress. She appears well-developed and well-nourished and well groomed.   HEENT: Normocephalic, atraumatic, pupils are  equal, round and reactive to light. Funduscopic exam is normal with sharp disc margins noted. She is status post bilateral cataract repairs. Extraocular tracking is good without limitation to gaze excursion or nystagmus noted. Normal smooth pursuit is noted. Hearing is grossly intact. Face is symmetric with normal facial animation and normal facial sensation. Speech is clear with no dysarthria noted. There is no hypophonia. There is a mild intermittent neck and voice tremor.  She is not aware of it.  Neck is supple with full range of passive and active motion. There are no carotid bruits on auscultation. Oropharynx exam reveals: mild mouth dryness, adequate dental hygiene. Tongue protrudes centrally and palate elevates symmetrically.  Chest:  Prominent bilateral diffuse crackles and also expiratory wheezing noted.  She also has noisy breathing particularly noticeable after she walked for me.    Heart: S1+S2+0, regular and normal without murmurs, rubs or gallops noted.   Abdomen: Soft, non-tender and non-distended with normal bowel sounds appreciated on auscultation.  Extremities: There is no pitting edema in the distal lower extremities bilaterally. Pedal pulses are intact.  Skin: Warm and dry without trophic changes noted. There are no varicose veins. Mild old appearing bruising is noted on the forearms and hands.   Musculoskeletal: exam reveals arthritic changes in both hands.   Neurologically:  Mental status: The patient is awake, alert and oriented in all 4 spheres. Her immediate and remote memory, attention, language skills and fund of knowledge are appropriate. There is no evidence of aphasia, agnosia, apraxia or anomia. Speech is clear with normal prosody and enunciation. Thought process is linear. Mood is normal and affect is  normal.  Cranial nerves II - XII are as described above under HEENT exam. In addition: shoulder shrug is normal with equal shoulder height noted. Motor exam: Normal bulk, strength and tone is noted.  There is no significant increase in tone, no hand tremor noted.  Romberg is not tested secondary to safety concerns.  Reflexes are 1+ throughout, toes are downgoing.  Fine motor skills and coordination: intact with normal finger taps, normal hand movements, normal rapid alternating patting, normal foot taps and normal foot agility.  Cerebellar testing: No dysmetria or intention tremor on finger to nose testing. Heel to shin is unremarkable bilaterally.   Sensory exam: intact and unchanged to light touch.  Gait, station and balance: She stands up slowly, she requires no assistance but does push herself up and stands mildly wide-based.  She walks without a walking aid.  She did not bring a walker or cane.  She has a wider based gait.  She walks without shuffling or without evidence of magnetic gait, more with a nonspecific gait insecurity and insecure turn.  She denies any lightheadedness upon standing and denies vertiginous symptoms   Assessment and Plan:   In summary, MEREDETH FURBER is an 79 year old female with an underlying complex medical history of hyperlipidemia, allergies, postherpetic neuralgia, osteoporosis, interstitial lung disease, history of Covid pneumonia in December 2020, fall with history of rib fractures in December 2020, rheumatoid arthritis, and hospitalization in August 2021 for acute on chronic respiratory failure with hypoxia, who, who presents for evaluation of her balance problems approximately 2 years duration with mild progression and history of falls.  She had a recent CT angiogram with and without contrast of the head and neck earlier this month.  I was able to review the CT angiogram images of the head.  She does have mild ventriculomegaly.  The report suggests stable  appearance compared to 2017 which would be surprising if there was underlying normal pressure hydrocephalus.  She was noted to have mild ventriculomegaly on MRI in early 2017.  While not impossible that she has some component of NPH, I believe her balance problem is more of a multifactorial nature or etiology.  I think contributors are insecurity with gait secondary to prior falls.  She had significant worsening of her pulmonary function and respiratory failure with development of interstitial lung disease.  She has had hypoxia as well which may have affected her brain as well.  It is difficult to tell.  In addition, she is on several medications including certain medications that could affect her balance adversely.  In addition, normal aging does not favor the balance.  I think it is a complicated situation.  She has had some evidence on imaging testing recently of intracranial stenosis as well, could be clinically less significant.  Nevertheless, I would like to proceed with a consultation with interventional radiology for her intracranial stenosis.  In addition, I would like to proceed with physical therapy for her gait and balance evaluation and potential therapy.  We talked about NPH, its evaluation and treatment options including shunt placement and large volume spinal tap.  I explained the evaluation to her including a valuable evaluation with physical therapy before and after the tap to see if she has clinical improvement in her gait and symptoms.  She is encouraged to think about it.  Mutually agreed not to push for invasive testing or an invasive treatment approach at this time.  We can certainly revisit this.  She is advised to proceed with physical therapy evaluation and I placed a referral to interventional radiologist, Dr. Estanislado Pandy for his input.  She is advised to stay well-hydrated.  She may not be hydrating well enough with water by self admission.  She is advised to follow-up with me in about 3  months, sooner if needed.  I answered all her questions today and she was in agreement.  Thank you very much for allowing me to participate in the care of this nice patient. If I can be of any further assistance to you please do not hesitate to call me at [phone].  Sincerely,   Star Age, MD, PhD  This was an extended visit of over 60 minutes.

## 2020-04-02 NOTE — Patient Instructions (Signed)
As discussed, your recent CT angiogram of your head and neck arteries showed some hardening of the arteries.  We will request consultation with interventional radiologist, Dr. Estanislado Pandy.   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  This can be seen in a condition called normal pressure hydrocephalus, meaning that there is excess fluid pressure on your brain, symptoms can include memory loss, gait disorder, falls, and bladder dysfunction including incontinence.  You do have some symptoms that could tie in with underlying normal pressure hydrocephalus.  To evaluate for this condition, we recommend a spinal fluid test, in particular, a large-volume spinal tap, taking cerebrospinal fluid.  We send it for routine testing at the time.  It is important to assess your gait before and after and we rely on physical therapy for this.  For now, we will make a referral to physical therapy for evaluation and therapy of your balance issues.    We do not have to proceed with a lumbar puncture right away.  If you want to think about it and let us know, it is okay.  I would like to follow you clinically as well.  I would like to have you come back for a reevaluation routinely in 3 to 4 months, sooner if needed.  Ultimately, the more long-term treatment for normal pressure hydrocephalus is in the form of a shunt which is called a VP shunt, which stands for ventriculoperitoneal shunt, this is a small tube placed in one of the brain fluid spaces and drains excess fluid on the ongoing basis.  This is done by a neurosurgeon.  We can consult with neurosurgery after the lumbar puncture. Call us if you would like to proceed with the spinal fluid test.  We will then make a referral to physical therapy at the same time.

## 2020-04-08 ENCOUNTER — Other Ambulatory Visit: Payer: Self-pay

## 2020-04-08 ENCOUNTER — Ambulatory Visit (INDEPENDENT_AMBULATORY_CARE_PROVIDER_SITE_OTHER): Payer: Medicare Other | Admitting: Internal Medicine

## 2020-04-08 DIAGNOSIS — M359 Systemic involvement of connective tissue, unspecified: Secondary | ICD-10-CM

## 2020-04-08 DIAGNOSIS — J849 Interstitial pulmonary disease, unspecified: Secondary | ICD-10-CM

## 2020-04-08 DIAGNOSIS — R5383 Other fatigue: Secondary | ICD-10-CM

## 2020-04-08 DIAGNOSIS — J8489 Other specified interstitial pulmonary diseases: Secondary | ICD-10-CM | POA: Diagnosis not present

## 2020-04-08 LAB — PULMONARY FUNCTION TEST
DL/VA % pred: 108 %
DL/VA: 4.38 ml/min/mmHg/L
DLCO cor % pred: 79 %
DLCO cor: 15.49 ml/min/mmHg
DLCO unc % pred: 74 %
DLCO unc: 14.39 ml/min/mmHg
FEF 25-75 Pre: 0.97 L/sec
FEF2575-%Pred-Pre: 65 %
FEV1-%Pred-Pre: 66 %
FEV1-Pre: 1.35 L
FEV1FVC-%Pred-Pre: 100 %
FEV6-%Pred-Pre: 70 %
FEV6-Pre: 1.83 L
FEV6FVC-%Pred-Pre: 105 %
FVC-%Pred-Pre: 66 %
FVC-Pre: 1.83 L
Pre FEV1/FVC ratio: 74 %
Pre FEV6/FVC Ratio: 100 %

## 2020-04-08 NOTE — Research (Signed)
Title: Chronic Fibrosing Interstitial Lung Disease with Progressive Phenotype Prospective Outcomes (ILD-PRO) Registry   Protocol #: IPF-[id number], Clinical Trials # [id number], Sponsor: [employer]  Protocol Version Amendment 4 dated 12Sep2019  and confirmed current on 04/08/2020 Consent Version for todays visit date of 04/08/2020  is Version Amendment 4 [phone])  Clinical Research Coordinator / Research RN note : This visit for Subject Stacey [name] with DOB: 1940/12/12 on 04/08/2020 for the above protocol is Visit/Encounter #6 Month Follow-up and is for purpose of research. The consent for this encounter is under Protocol Version Amendment 4 (12Sep2019) and is currently IRB approved. Subject expressed continued interest and consent in continuing as a study subject. Subject confirmed that there was no change in contact information (e.g. address, telephone, email). Subject thanked for participation in research and contribution to science.   In this visit 04/08/2020 the subject completed the blood work and questionnaires per the above referenced protocol. Please refer to the subject's paper source binder for further details.   Signed by Gwendolyn Grant Research Assistant [facility]  [place], [place] 11:49 AM 04/08/2020

## 2020-04-08 NOTE — Progress Notes (Signed)
Spiromety and Dlco done today.

## 2020-04-10 ENCOUNTER — Ambulatory Visit (INDEPENDENT_AMBULATORY_CARE_PROVIDER_SITE_OTHER): Payer: Medicare Other | Admitting: Internal Medicine

## 2020-04-10 ENCOUNTER — Encounter: Payer: Self-pay | Admitting: Internal Medicine

## 2020-04-10 ENCOUNTER — Other Ambulatory Visit: Payer: Self-pay

## 2020-04-10 VITALS — BP 124/78 | HR 88 | Temp 97.3°F | Ht 66.0 in | Wt 142.8 lb

## 2020-04-10 DIAGNOSIS — M359 Systemic involvement of connective tissue, unspecified: Secondary | ICD-10-CM

## 2020-04-10 DIAGNOSIS — I2510 Atherosclerotic heart disease of native coronary artery without angina pectoris: Secondary | ICD-10-CM | POA: Diagnosis not present

## 2020-04-10 DIAGNOSIS — R5382 Chronic fatigue, unspecified: Secondary | ICD-10-CM | POA: Diagnosis not present

## 2020-04-10 DIAGNOSIS — R053 Chronic cough: Secondary | ICD-10-CM

## 2020-04-10 DIAGNOSIS — J8489 Other specified interstitial pulmonary diseases: Secondary | ICD-10-CM | POA: Diagnosis not present

## 2020-04-10 DIAGNOSIS — R42 Dizziness and giddiness: Secondary | ICD-10-CM | POA: Diagnosis not present

## 2020-04-10 DIAGNOSIS — Z79899 Other long term (current) drug therapy: Secondary | ICD-10-CM

## 2020-04-10 DIAGNOSIS — J4540 Moderate persistent asthma, uncomplicated: Secondary | ICD-10-CM

## 2020-04-10 DIAGNOSIS — R05 Cough: Secondary | ICD-10-CM

## 2020-04-10 LAB — HEPATIC FUNCTION PANEL
ALT: 16 U/L (ref 0–35)
AST: 18 U/L (ref 0–37)
Albumin: 3.7 g/dL (ref 3.5–5.2)
Alkaline Phosphatase: 48 U/L (ref 39–117)
Bilirubin, Direct: 0 mg/dL (ref 0.0–0.3)
Total Bilirubin: 0.4 mg/dL (ref 0.2–1.2)
Total Protein: 6.3 g/dL (ref 6.0–8.3)

## 2020-04-10 MED ORDER — MOMETASONE FURO-FORMOTEROL FUM 100-5 MCG/ACT IN AERO
2.0000 | INHALATION_SPRAY | Freq: Two times a day (BID) | RESPIRATORY_TRACT | 0 refills | Status: DC
Start: 2020-04-10 — End: 2022-07-24

## 2020-04-10 NOTE — Patient Instructions (Addendum)
     ICD-10-CM   1. 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 Encounter for long-term current use of high risk medication  Z79.899   3. Moderate persistent asthma without complication  M76.72   4. Dizziness  R42   5. Chronic cough  R05   6. 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 You have recovered from that based on today' walk and symptoms score and fact you are not needing o2 at rest/walk  - . Still, overall over time the last 1-2 years, your ILD is  progressive   plan  - continue ofev 100mg  once daily    - do not wan tto increase doseage due to dizziness and already mild nausea, diarrhea and overall fatigue etc., - - check LFT 04/10/2020  --Continue  Rituxan in setting of Ofev -Next dose September 2021 per Dr. Stann Mainland = Medrol for rheumatoid arthritis  4mg  alternating with 2mg  per day - check blood G6PD - if normal - will start bactrim for prevention of opportunistic infections  - this strategy might help prevent your concern of recurrent flares and cough   #Symptoms burden of cough, fatigue, dizziness  - address dizziness with neuro - hold off on others for now - more medications for these means more side effects  Followup - 3 months - 30 min visit - symptoms score and walk test  - I will decide on future pft and CT decision at time of followup

## 2020-04-10 NOTE — Progress Notes (Addendum)
Brief patient profile:  12 yowf  never smoker with allergies/inhalers as child outgrew by [employer] then  RA since around 2000  Prednisone  x decades and prev eval by Dr Joya Gaskins around 2004 for sob resolved s maint rx and referred 05/26/2013 by Dr Shelia Media for bronchitis and abn cxr   History of Present Illness  05/26/2013 1st [facility] office visit/ Wert cc June 2014 dx pna  In [place] and remicade stopped and 100% better and placed arencia in September 2014  then abruptly worse first week in November with cough green sputum s nasal symptoms, fever low grade and no cp or cough and completely recovered prior to [place] does not recall abx but issue is why keeps getting sick and abn CT Chest (see below).   Arthritis symptoms well controlled at present on Rx for RA rec Nexium 40 mg Take 30-60 min before first meal of the day and add pepcid 20 mg one at bedtime whenever coughing.     10/19/2017  f/u ov/Wert re:  RA lung dz  Chief Complaint  Patient presents with  . Follow-up    Cough is much improved, but has not resolved yet. Cough is non prod. She has not had to use her neb.   Dyspnea:  Not limited by breathing from desired activities  But some doe x steps Cough: daytime > noct dry  Sleep: fine  SABA use:  No saba Medrol 4 mg a/w $Remo'2mg'yITnf$  per day/ ok control of arthritis  rec Start back on gabapentin up to 300 mg each am  in addition to the the two at bedtime  If not better increase the medrol to 8 mg daily until bettter then taper back to where you      01/10/2018  f/u ov/Wert re:  RA  Lung dz Medrol  4 mg  One alternating with a half Chief Complaint  Patient presents with  . Follow-up    PFT's done. Her breathing has been gradually worsening since the last visit. She has occ cough- non prod.   Dyspnea gradually worse since last ov:  MMRC1 =  MMRC3 = can't walk 100 yards even at a slow pace at a flat grade s stopping due to sob    Gradually x 3 m / more fatigue / no change in  arthritis  Cough: not an issue rec Protonix 40 mg Take 30-60 min before first meal of the day  GERD diet   01/17/2018 acute extended ov/Wert re: cough on medrol 4 mg  One a/w one half  Chief Complaint  Patient presents with  . Acute Visit    started coughing 01/11/18- occ prod with minimal green sputum.  She states also wheezing and having increased SOB.    abruptly worse 01/11/18 with severe 24/7 coughing >>  prod min green mucus esp in am/ assoc with subjective wheeze and did not follow previous contingencies re flutter / saba/ increase medrol and admits she does not rember those written instructions nor how to use the neb provided .  No fever/ comfortable at rest sitting  rec For cough > mucinex dm 1200 mg every 12 hours and cough into the flutter valve as much as possible  Doxycycline 100 mg twice daily x 10 days with glass of water Medrol $RemoveBefo'4mg'LAqtCieLNBs$  x 2 now and take 2  daily until cough is better then 1 daily x 5 days and then resume the previous dose  Shortness of breath/ wheezing/ still  coughing > albuterol neb every 4 hours as needed      01/20/2018 acute extended ov/Wert re: refractory cough and sob 01/11/18 Chief Complaint  Patient presents with  . Acute Visit    she is not feeling better, coughing , very SOB, wheezing  mucus now clear/scant  on doxy/ neb machine not working (tube would not plug into the side s adequate force and she was not capable of applying it due to RA hands. Cough/ wheeze/ sob 24/7 / flutter not helping/ can't lie down at hs   rec While coughing protonix 40 mg Take 30- 60 min before your first and last meals of the day  Shortness of breath/ wheezing/ still coughing > albuterol neb every 4 hours as needed  Depomedrol 120 mg IM and medrol 32 mg daily x 2 days,  then 16 mg x 3 days,  Then 8 mg x 4 days , then resume the 4 mg daily  For severe cough > tylenol 3# one every 4 hours if needed  Go to ER if condition worsens on above plan       Date of admission:  01/22/2018             Date of discharge: 01/27/2018   History of present illness: As per the H and P dictated on admission, "[name] a77 y.o.female,w Rheumatoid arthritis, ILD Asthma, apparently c/o increase in dyspnea this evening. Dry cough. Denies fever, chills, cp, palp, N/v, diarrhea, brbpr, black stool. Pt notes recently being given steroid injection in office as well as being placed on doxycycline. This might have helped slightly but pt worse Hospital Course:  Summary of her active problems in the hospital is as following. 1 dyspnea/hypoxemia/ILD Concerned that likely GERD Is causing ILD. Patient with cough.  Patient with complaints of awakening with cough and also with oral intake which is slightly improved since 01/24/2018. assessed by speech therapy and speech therapy raising concern of esophageal component but no signs of aspiration.  2D echo with a EF of 55 to 60% with no wall motion abnormalities, grade 1 diastolic dysfunction.  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 Pulmonary felt that the patient should be on scheduled Reglan. I have placed the patient on scheduled potassium before sleep. Continue steroids on discharge continue Mucinex and Claritin as well as inhalers. Patient will follow-up with pulmonary outpatient  2. Gastroesophageal reflux disease Continue PPI and H2 blocker.I changed PPI to [facility].  3. Rheumatoid arthritis Outpatient follow-up.   4. Anxiety Continue Effexor.    All other chronic medical condition were stable during the hospitalization.  Patient was ambulatory without any assistance. On the day of the discharge the patient's vitals were stable , and no other acute medical condition were reported by patient. the patient was felt safe to be discharge at home with family.  Consultants: PCCM  Procedures: Echocardiogram      03/21/2018  f/u ov/Wert re:   S/p admit was  transiently better  and downhill since Labor day on medrol 4 mg daily  Last orencia on Sept 4th 2019  Chief Complaint  Patient presents with  . Acute Visit    Per patient, she has had a dry cough since July 2019. She has been wheezing as well. Increased fatigue. Body aches. Denies any fever or chills.   Dyspnea:  MMRC4  = sob if tries to leave home or while getting dressed   Cough: harsh/ hacking mostly dry/ has flutter not using  SABA use: not much better with rx   No obvious day to day or daytime variability or assoc excess/ purulent sputum or mucus plugs or hemoptysis or cp or chest tightness, subjective wheeze or overt sinus or hb symptoms.     Also denies any obvious fluctuation of symptoms with weather or environmental changes or other aggravating or alleviating factors except as outlined above   No unusual exposure hx or h/o childhood pna/ asthma or knowledge of premature birth.   INpatient consult 03/26/18 79 year old with rheumatoid arthoritis.At baseline the patient lives at home with her husband and is independent of ADLs.  Has been on many immune suppressants over decades and curently on orencia x 4 year and prednisone. Does not recollect being on bactrim/dapsone. Chart mentions BOOP/MAI in 2001 but she denies this. 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 Got admitted. Rx with steroids. Per Notes - clnical suspicion of  arytenoid inflmmation related wheeze noticed (she also reports asthma NOS). Follolwup with ENT recommended (but not seen one as yet). She also appears to have passed swallow with rec for regular diet with thin liquids but did to have mild eso stricture and reflux during testing . PFT shows 10% FVC decline for first ime. CT chest at this tme (aug 2019) showed new rLL infiltrate. ECHO July 2019 without evicence of  elevated PASP and saw cards [facility] June 2019 and was considered to have worsenin dyspnea due to [facility] issues (reports stress test at [facility] that was normal but I cannot see it)  She reports after discharge she got better but in last several weeks has deteriorated with cough and dyspnea. There is new hypoxemia (currently RA with nail polish and poor circulation - 89% pulse ox) needing 2L [place]. Per [facility] improved with steroids and abx. CTA 03/24/2018 => shows that RLL inifltrate has improved . Other chronic ILD changes + and small  Hiatal hernia + witthout change.  Review of lab work does show eosinophilia at time of admision  EVENTS 03/21/18 - IgE -5, blood allegy panel - negative, 03/23/2018 - - admit . HIGH EOS 2300, ESR 48, BNP 89 , HIV neg 9/12- PCT negative, RVP negative 9/14 -  PCT < 0.1, Urine strep - negative, MRSA PCR - positive. IgE - normal 4, Blood allergy panel repeat - negative 03/26/2018 - leading consideration for airway (BO in RA +/- asthma) related flare either due to MRSA bronchitis or clinical suspicion of arytenoid inflmmation +/- GERD relatd flare (she has small hiatal hernia)  +/- ? Dysphagia  up causing mild hypoxemia acute resp failure, wheeze . Allergy and IgE blood work negative thought. Patient reports being better but says she is choking on drinkin water [facility] MD says wheeze improved significantly with steroids.  RN says was down to RA yesterday evening but needed 1L [place] at sleep. Today -Room air at rest 94% and desaturated to 86% walking 60 feet 03/27/2018  - better. Off o2 at rest. STill coughs with water and when lies down.  Husband at bedside. Both requesting ILD clinic followup . Desaturated t 79% walking 90 feet.   OV 04/12/2018  Subjective:  Patient ID: Stacey [name], female , DOB: [birth year] , age 16 y.o. , MRN: [id number] , ADDRESS: [place] [id number]   04/12/2018 -  Chief Complaint  Patient presents with  . Consult    Pt is a former  [facility] pt.  Pt denies any current complaints of cough, SOB, or CP but states the cough she originally had ended her up in the hosp 9/11-9/17 with dx acute respiratory failure. Pt does wear 2pulse with exertion and also wears 2L continuous when at home.     HPI LAKETTA SODERBERG 79 y.o. -presents for follow-up to the ILD clinic.  She is known to have rheumatoid arthritis with ILD changes.  She had been followed by Dr. Christinia Gully.  However in July 2019 in September 2019 she has had 2 admissions to the hospital with respiratory distress and hypoxemic respiratory failure.  In the first 1 that seem to be right lower lobe infiltrate and then she improved from it but in the second 1 even though the right lower lobe infiltrates were better she still was hypoxemic.  Acid reflux and dysphagia was considered a possible etiology but she passed swallow study 2 times.  They thought she had some reflux.  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  At the same time it is not clear if the ILD is progressive based on pulmonary function testing below   At this point in time she tells me that she is getting home physical therapy.  Her fatigue is improving but it is not fully resolved.  She was discharged on continuous oxygen which she is using.  However she is feeling less short of breath.  Today in fact when we turned her oxygen off and walked her she did not desaturate and this is a significant improvement.  She is on monthly Orencia through the [facility] rheumatologist Dr. Eda Paschal.  At this point in time she is put the Orencia on hold.  She told me that she is been on Orencia for 4 years and never had a respiratory exacerbation still recently x 2.  Although before going on Orencia she had pneumonia while on Remicade and the Remicade.  In terms of her rheumatoid arthritis she hardly has any pain.  Her joint architecture is fairly well-preserved because of various  immunomodulators over time.  She says that she was on Remicade for years and when she stopped it for 8 weeks before the switch to [place] she never really had a relapse in her rheumatoid arthritis.  She is largely pain and stiffness free.  She believes she can go without  her Orencia for a while.  Review of the literature shows greater than 10% chance of a respiratory infection especially COPD exacerbation.  Although the time frame for this is unclear.       OV 06/07/2018  Subjective:  Patient ID: Stacey [name], female , DOB: [birth year] , age 71 y.o. , MRN: [id number] , ADDRESS: [place] [address]   06/07/2018 -   Chief Complaint  Patient presents with  . Follow-up    ILD, PFT done today, some wheezing and coughing but better tha before   Rheumatoid arthritis ILD and asthma/obstructive lung disease phenotype on Dulera  HPI JAYLYNNE BIRKHEAD 79 y.o. -presents for routine follow-up with her husband.  She is here to follow-up with [facility] Dr. Stann Mainland.  She plans to do this in December 2019.  She continues to be off Orencia.  Her joints are slowly getting stiff again.  She believes that she will need to be back on immunosuppression agent again.  She  currently continues Medrol 4 mg alternating with 2 mg.  This for her rheumatoid arthritis.  In terms of her joints she continues on Medrol 4 mg alternating with 2 mg but not on any other immunosuppression agent.  Overall she is been stable but for the last 2 weeks has had green sputum and wheezing and chest congestion and cough.  She recently visited her husband who was hospitalized and walking the long hallways at [facility] made a short of breath but she thinks this is probably baseline for her.  There are no other new issues.  She did have spirometry and DLCO and this shows a decline compared to September 2019 and a similar to July 2019.  It is documented below.  This is probably reflective of a  flareup   OV 07/21/2018  Subjective:  Patient ID: Stacey [name], female , DOB: 01-25-1941 , age 2 y.o. , MRN: [id number] , ADDRESS: [place] [address]   07/21/2018 -   Chief Complaint  Patient presents with  . Follow-up    Pt states she has been doing well since last visit. States she is about to begin Rituxan with [facility]. Pt still becomes SOB with exertion. Denies any complaints of cough or CP.   Rheumatoid arthritis ILD and asthma/obstructive lung disease phenotype on Dulera  HPI KAYLLA COBOS 79 y.o. -presents for follow-up of the above.  Last seen just before Thanksgiving 2019.  In the interim overall stable although on June 23, 2018 she climbs a steep flight of stairs which is unusual exertion for her and she became very dyspneic.  Following day saw Dr. Stann Mainland at [facility] rheumatology and was given Z-Pak and prednisone and started feeling better.  Although it is not fully clear to me she had fever and bronchitic symptoms.  I reviewed Dr. Stann Mainland note.  Dr. Stann Mainland is decided to start Rituxan for rheumatoid arthritis.  She is only having some minimal joint pain at this point.  She is off Orencia and continues to be off [place].  She did have some blood work with [place] before starting Rituxan.  She is due to see Dr. Stann Mainland within the next week and start her Rituxan.  Her liver function test July 18, 2017 is normal hemoglobin is normal.  CRP is also normal.  We did spirometry and walking desaturation test.  These show improvement compared to before and these are documented below.  Currently she not using nighttime or daytime oxygen.  She is wondering if she could switch rheumatology care to [facility].  This is because while she likes [employer] she is getting older and more frail and feels some body local would be of help.  I have sent a message to Dr. Estanislado Pandy inquiring.  Certainly we can help her with Rituxan infusions at [place] system  if needed.  She will check on this with her [facility] rheumatologist.       OV 03/27/2019  Subjective:  Patient ID: Stacey [name], female , DOB: [birth year] , age 76 y.o. , MRN: [id number] , ADDRESS: [place] [address]  Rheumatoid arthritis ILD and asthma/obstructive lung disease phenotype on [facility]   03/27/2019 -  Rourine fu   HPI TAYLEE GUNNELLS 79 y.o. -presents for the above.  Last seen in January 2020.  After that she has seen Dr. Stann Mainland rheumatology at [facility].  She is now getting Rituxan 2 doses every 6 months.  She says  this is helped her joints and her stiffness.  She is a little bit more mobile than usual.  However in terms of her respiratory status she continues to have episodic cough.  In June 2020 she again got hypoxemic and got admitted.  Since then she has had episodic cough.  She had a respiratory exacerbation in June 2020 for the admission she got steroids.  This seemed to help.  She is also on a higher dose Dulera right now.  In terms of her cough this seems to be her biggest problem.  She seems to be on Dulera, Singulair scheduled with also Tessalon and Delsym and DuoNeb.  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  Her last high-resolution CT scan of the chest was 1 year ago.  She says the dyspnea itself is not worse.  She is currently not using oxygen.  On exam she did have some wheezing.  Currently she has white and brown sputum but this is baseline.  In terms of a COVID wrist she has been tested recently couple of times and this is been negative.  She is isolating well.  She wanted to know COVID prevention activities and risk status and masking strategies.      OV 05/24/2019  Subjective:  Patient ID: Stacey [name], female , DOB: 13-Nov-1940 , age 45 y.o. , MRN: [id number] , ADDRESS: [place]  [id number]   05/24/2019 -   Chief Complaint  Patient presents with  . Follow-up    Pt was recently in the hosp due to ILD. Pt states that she has been better since being out of the hosp.   Rheumatoid arthritis ILD and asthma/obstructive lung disease phenotype on Dulera/Singulair  Post herpetic neuralgia - on elavil, gabapentin, effexor  HPI LORRINDA RAMSTAD 79 y.o. -returns for follow-up.  At the last visit approximately 2 months ago she was reporting worsening cough following an admission in summer 2020.  Her pulmonary function test suggested worsening ILD status.  Therefore we requested a high-resolution CT chest which she did in October 2020.  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  However in the interim after the CT scan was done towards the end of October 2020 she developed worsening of her cough over 2 weeks and also associated shortness of breath but significantly the cough is much worse.  She ended up getting admitted to the hospital.  There was some hypoxemia.  By this time she had finished a ENT evaluation that did not show any involvement of the arytenoids.  Pulmonary was consulted.  She was given a prednisone burst which she just finished I believe yesterday.  She is back on her baseline Medrol.  And she is feeling better.  Her oxygen status is improved although she is using oxygen at night now.  She is really frustrated with these recurrent flareups and these admissions which ended up with her having a wheeze and also hypoxemia.  Currently she is on her baseline Medrol for rheumatoid arthritis associated with Dulera and Singulair.  She is also on losartan for blood pressure.  Her walking desaturation test is slightly worse than baseline.  She has a GI consult pending because of the recurrent episodes of cough and flareups.  We went over exposure history.  We used interstitial lung disease questionnaire for the exposure history.  Specifically she denies any  electronic cigarette use of marijuana use of cocaine use or any IV drug abuse.  [place] last 14 years.  Asked extensive questions about the home environment it is positive for nebulizer use but the nebulizer does not have mildew or mold in it.  Otherwise no organic antigen exposure.  The house is not damp.  There is no mold or mildew in the shower curtain.  There is no humidifier use no steam iron use.  No Jacuzzi use.  No misting [place] outside to inside the house.  No pet birds.  No pet gerbils no feather pillows.  There is no mold in the [facility] duct.  She does not do any gardening.  Does not use wind instruments.  Also 122 question occupational history elicited and essentially negative.  The other issue is that she has polypharmacy.  She is asking for my help in reducing her medications.  She is on 3 medications for postherpetic neuralgia.  She is on losartan      OV 06/21/2019  Subjective:  Patient ID: Stacey [name], female , DOB: 15-Jul-1940 , age 41 y.o. , MRN: [id number] , ADDRESS: [place] [address]   06/21/2019 -   Chief Complaint  Patient presents with  . televisit    hosp 11/29-12/4 due to covid with pna. pt said that she is doing okay after recent hosp but states she has no energy.      MATSUE STROM 79 y.o. - last visit 05/24/2019. Diagnosed with covid-19 on 05/31/2019. Admitted 06/11/2019  - 06/16/2019. Quarantine ends 06/21/2019 today. Rx with Remdesviri and Decadron. Husband also on phone. Questions  1. Quarantine ends - from 06/22/19\ and she is not contagious and likely resistant to reinfection to covid for another few months  2. She is on 10 day dexamethasone for covid - today is last day for it  3. She is on dulera. Hospital gave combivent and she does not want do combivent - this is fine  4. Ofev for ILD - not started it yet . She had questions about side effects. Explained GI and  LFT monirtong. SHe wanted to wait till Jan 2021 and start it . I am ok with that.   5. Small hiat   IMPRESSION: HRCT OCt 2020 1. 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 Findings have progressed since 2017 and 2019 high-resolution chest CT studies. 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 Findings are consistent with UIP per consensus guidelines: Diagnosis of Idiopathic Pulmonary Fibrosis: An Official ATS/ERS/JRS/ALAT Clinical Practice Guideline. Page, Iss 5, [phone], Mar 13 2017. 2. 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 4. Small hiatal hernia.  Aortic Atherosclerosis (ICD10-I70.0).   Electronically Signed   By: Ilona Sorrel M.D.   On: 04/24/2019 13:29   OV 08/03/2019 - face to face visit  Subjective:  Patient ID: Stacey [name], female , DOB: March 25, 1941 , age 17 y.o. , MRN: [id number] , ADDRESS: [place] [id number]    Rheumatoid arthritis with Joint s/ILD- Rituxan/steroids ([facility])  - progressive phenotype (CT 2017/2019 -> Oct 2020). Planned Ofev start in Nov 2020 but did not start as of dec 2020. LFT normal Jan 2021  [place]ma/obstructive lung disease phenotype on [employer], production  herpetic neuralgia - on elavil, gabapentin, effexor  Coronary Artery Calcification - 1 vessel - cardiology referral done  Diagnosed with covid-19 on 05/31/2019. Admitted 06/11/2019  - 06/16/2019. Rx with Remdesviri and Decadron.  Small Hiatal hernia onCT Oct 2020  Rib fracture from fall Dec 2020: Musculoskeletal: There is nondisplaced fractures of the posterior left fifth, 6, 8, 9, and tenth left ribs. There is unchanged slight superior compression deformities of the T5, T6, T10 vertebral bodies with less than 25% loss in height.Associated  pulmnary contusion +    HPI ALYNA STENSLAND 79 y.o. -presents for a visit. Her nintedanib has been delayed because of COVID-19. Also subsequently a fall. She saw a nurse practitioner on July 20, 2019 and they took a shared decision to start nintedanib. She had a ? telephone visit with Dr. Candie Mile the rheumatologist at [facility]. Patient scheduled for her next Rituxan in February 2021 but reviewed the notes indicates this could be pushed to early spring 2021. Dr. Stann Mainland is okay with the patient study got intubated at this point in time.  Patient has follow-up with Dr. Stann Mainland tomorrow at [facility].  At this face-to-face visit she is here with her husband.  She says she is much better after the Covid and also the fall that fractured her ribs.  Nevertheless all this is left her fatigue.  Infective fatigue scores are much worse.  Also in the last 2 weeks has had increased cough wheezing and shortness of breath.  The sputum was also changed color in the last 1 week to clear green.  This is all new.  Her symptom score is therefore a worse than her baseline.  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  She is only taking 100 mg once a day.  The plan was to go up to 100 mg twice a day next week.  This is a minimum effective dose.  The maximum dose is 150 mg twice a day.  Given her age and comorbidities we are starting at a low dose.  Part of this visit is to make sure that she is tolerating the drug fine.  And so far she is.  She is interested in the ILD-pro registry study done by the [facility].   She is working with physical therapy for her fatigue.  Ambulatory Walk 07/20/2019 2 Lap- O2 92% RA; HR 109 No shortness of breath She did not need to stop Used walker    OV 09/20/2019  Subjective:  Patient ID: Stacey [name], female , DOB: 1940/10/16 , age 61 y.o. , MRN: [id number] , ADDRESS: [place] [id number]   09/20/2019 -   Chief Complaint  Patient presents with  . Televisit    Called and spoke with pt who stated she has been feeling okay since last visit. Pt stated she is still taking OFEV and denies any complaints. Pt states she believes her breathing is stable at this point.    Rheumatoid arthritis with Joint s/ILD- Rituxan/steroids ([facility] uinviesty)  - progressive phenotype (CT 2017/2019 -> Oct 2020). Planned Ofev start in Nov 2020 but did not start as of dec 2020. 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 Admitted 06/11/2019  - 06/16/2019. Rx with Remdesviri and Decadron.  Small Hiatal hernia on CT Oct 2020  Rib fracture from fall Dec 2020: Musculoskeletal: There is nondisplaced fractures of the posterior left fifth, 6, 8, 9, and tenth left ribs. There is unchanged slight superior compression deformities of the T5, T6, T10 vertebral bodies with less than 25% loss in height.Associated pulmnary contusion +   HPI AUSTINE KELSAY 79 y.o. - has 2nd covid vaccine later this week. Rituxan is end of the month. Baseline RA regiment is On dulera for associated obstructio of lung. Continue ofev 124m bid since mid jan 2021 for ILD. No side effects. Did see BDerl Barrowfor face to face visit early feb 2021 -> for bronchitis and felt better after prdnisone and zpak. Currently on medrol   8 mg day and staying there per BDerl Barrow. Wants t oknow if she can taper. Wants to know how she can prevent care flare ups. Explained ofev, masking and  social distancing prevent respiratory infection.Denies choking on food or aspirating. Denies mold in house - relatively news. Denies dog. Denies cat. Denies carious teeth. Denies post nasal drip  OVerll feels better and improved dyspnea.     OV 12/18/2019  Subjective:  Patient ID: MTamala [name] female , DOB: 6November 02, 1942, age 79y.o. , MRN: [id number], [place] [id number] PCP RShon Baton MD Rheumatologist-Dr. JEda Paschalat [facility] Inc[facility]: Dr. RVeryl Speak 12/18/2019 -   Chief Complaint  Patient presents with  . Follow-up    no worse   Rheumatoid arthritis with Joint s/ILD- Rituxan/steroids (duke uinviesty)  - progressive phenotype (CT 2017/2019 -> Oct 2020). Planned Ofev start in Nov 2020 but did not start as of dec 2020. 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 Admitted 06/11/2019  - 06/16/2019. Rx with Remdesviri and Decadron.  Small Hiatal hernia on CT Oct 2020  Rib fracture from fall Dec 2020: Musculoskeletal: There is nondisplaced fractures of the posterior left fifth, 6, 8, 9, and tenth left ribs. There is unchanged slight superior compression deformities of the T5, T6, T10 vertebral bodies with less than 25% loss in height.Associated pulmnary contusion +  -On Reclast once a year through Dr. JennEda PaschalPI MargTamala Fothergilly68. 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  It has been a few month since I last saw her.  In this time she did not taper  her Medrol down.  She stated 8 mg/day.  She was originally taking the Medrol for her rheumatoid arthritis for many years.  She recently increased the dose from 4 mg / 2 mg every other day to 8 mg daily because of recurrent respiratory exacerbations.  She  is asking for [facility] refills for obstructive lung disease.  She is getting Rituxan through Dr. Eda Paschal for rheumatoid arthritis.  Her next Rituxan is in September 2021.  She saw Dr. Stann Mainland in April 2021 and reviewed the note.  Her liver function test at the time was fine.  In terms of her ILD she continues on nintedanib 100 mg twice daily.  She says she has no side effects from the drug she is tolerating it quite well.  We discussed about increasing the dose but she wants to hold off because she just got a new supply.  However she is open to increasing the dose when the supply runs out.  She is due for liver function test today.  In terms of overall symptoms things have improved as can be seen below and the symptom score.Marland Kitchen  However her main concern is that of fatigue.  She is open to attending pulmonary rehabilitation.  She is not using oxygen at home.  Sometime back when we tested overnight oxygen desaturation test she did not desaturate.  Walking desaturation test today stable.  She does notice of note that her blood pressure has gone up.  She is working with her primary care physician on this.  She is on losartan.    OV 04/10/2020   Subjective:  Patient ID: Stacey [name], female , DOB: 1941-07-11, age 79 y.o. years. , MRN: [id number],  ADDRESS: [address] [place] [address] PCP  Shon Baton, MD Providers : Treatment Team:  Attending Provider: Brand Males, MD   Chief Complaint  Patient presents with  . Follow-up    CT scan 8/23, has not increased Ofev, shortness of breath with exertion.    Rheumatoid arthritis with Joint s/ILD- Rituxan/steroids (duke uinviesty)  - progressive phenotype (CT 2017/2019 -> Oct  2020). Planned Ofev start in Nov 2020 but did not start as of dec 2020. 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 Admitted 06/11/2019  - 06/16/2019. Rx with Remdesviri and Decadron.  Admitted August 2021 for respiratory infection not otherwise specified.  Following trip to [place].  Was hypoxemic.  Small Hiatal hernia on CT Oct 2020  Rib fracture from fall Dec 2020: Musculoskeletal: There is nondisplaced fractures of the posterior left fifth, 6, 8, 9, and tenth left ribs. There is unchanged slight superior compression deformities of the T5, T6, T10 vertebral bodies with less than 25% loss in height.Associated pulmnary contusion +  -On Reclast once a year through Dr. Eda Paschal    HPI Stacey [name] 79 y.o. -returns for follow-up.  Last week by myself early June 2021.  Then after that in early August 2020 when she got admitted for respiratory infection not otherwise specified.  She was hypoxemic on room air.  This followed a trip to [place].  In the follow-up phase end of August 2020 when she had high-resolution CT chest that showed increased groundglass opacities but when compared to a CT from 8 months  ago.  Also chronic ILD had worsened.  I personally visualized the image at this point in time she is improved from this admission she is not using oxygen at all.  In fact walking desaturation test shows near baseline.  Nevertheless overall her symptom scores have declined over time ILD  symptom score is listed below.  She is really concerned about several issues.  In terms of her ILD she was inquiring about escalating to 150 mg twice daily.  Even at 100 mg twice daily she is significantly symptomatic in terms of nonrespiratory issues such as nausea and dizziness and fatigue.  I did caution her that these could all get worse.  Therefore she is just opted to be at 100 mg twice daily  -Recurrent admissions for respiratory issues [I did address that some of this was Covid and fall related and not necessarily her usual summer exacerbation].  Nevertheless, I asked about exposures at home.  She does not have a feather blanket or feather pillow or feather jacket.  She denies any mold or bird exposure or mildew exposure at home.  Does not do any gardening or wind instruments.  She is immunosuppressed and I did notice that she is not on Bactrim for PCP prophylaxis.  In 2019 when we checked G6PD the lab could not process this lab.  She is interested in Bactrim prophylaxis.  She is interested in okay getting G6PD rechecked   -She is dealing with significant amount of dizziness.  Neurology is sending her to neuro rehab.  It is believed it is multifactorial.  I reviewed her recent CT head angio report.  I personally visualized the image   -She is also worried about cough.  Currently cough is tolerable but she says prior to her exacerbations cough gets worse.  We did discuss with her that if she is aspirating which she denied.  We did discuss about the possibility of starting Bactrim and monitoring her cough and she is fine with that.  I did review old records  SYMPTOM SCALE - ILD 03/27/2019  05/24/2019 (2-3 weeks pre cpovid)  08/03/2019 Post covid Post fall rib # 12/18/2019  04/10/2020 142#  O2 use ra      Shortness of Breath 0 -> 5 scale with 5 being worst (score 6 If unable to do)      At rest 0 $Remo'2 3 2 3  'fDYho$ Simple tasks - showers, clothes change, eating, shaving 0 $RemoveB'3 2 3 4  'qDBHpauU$ Household (dishes,  doing bed, laundry) $RemoveBefor'2 4 5 3 4  'qGLklWywcjmN$ Shopping $RemoveBefo'2 3 6 4 4  'WTzXzvhCwwN$ Walking level at own pace $Remove'2 3 4 4 3  'dcSkGbo$ Walking up Stairs $RemoveB'3 3 5 4 4  'hjzAirJX$ Total (40 - 48) Dyspnea Score $RemoveBefor'9 17 25 20 22  'IMOayeVsAblh$ How bad is your cough? $RemoveBef'2 2 3 1 2  'UxCxvaUFCC$ How bad is your fatigue 3 3.5 5 3.5 3  nause    0 1  vomit    0 1  diarrhea    0 1  anxiety    0 2  depression    0 1      Results for CORLISS, COGGESHALL (MRN [id number]) as of 03/27/2019 09:43  Ref. Range 07/17/2013 09:53 02/07/2016 13:09 01/11/2017 11:05 01/10/2018 10:13 05/17/2018 14:24 06/07/2018 10:57 07/21/2018 10:53 03/27/2019 09:02 6/1 04/08/20  FVC-Pre Latest Units: L 2.13 2.16 2.14 1.93 1.99 1.81 2.18 1.79 1.90 1.83L  FVC-%Pred-Pre Latest Units: % 68 71 71 65 75 68 77 64  66$  Results for DALIA, JOLLIE (MRN [id number]) as of 03/27/2019 09:43  Ref. Range 07/17/2013 09:53 02/07/2016 13:09 01/11/2017 11:05 01/10/2018 10:13 05/17/2018 14:24 06/07/2018 10:57 07/21/2018 10:53 03/27/2019 09:02 6/1 04/08/20  DLCO unc Latest Units: ml/min/mmHg 18.85 18.25 16.50 16.09 16.58 14.06  15.04 13.91 15.49  DLCO unc % pred Latest Units: % 69 67 61 59 70 59  77 71% 79%    Simple office walk  feet x  3 laps goal with forehead probe 04/12/2018  07/21/2018  05/24/2019  12/18/2019  04/10/2020   O2 used Room air - off o2 x 10 min Room air  Room air ra ra  Number laps completed 3 x 185 feet 3 x 250 feet     Comments about pace normal normal   Slow, no walker  Resting Pulse Ox/HR 99% and 72/min 97% and 74/min 97% and 81/min 97% and 76/m 96% and 88/min  Final Pulse Ox/HR 98% and 95/min 96% and 96/min 94% and 107/min 93% and 94 92% and 97  Desaturated </= 88% no no no no no  Desaturated <= 3% points no no Yes, 3 pints Ues, 4 points Yes,  4 points  Got Tachycardic >/= 90/min yes yes yes yes yes  Symptoms at end of test No complaint Mild dyspnea Mod dyspnea Some dyspnea dyspnea  Miscellaneous comments improed from hospital Same v improved woprse same     Lungs/Pleura: Biapical pleuroparenchymal scarring. Patchy and largely  peripheral peribronchovascular ground-glass, increased from 07/03/2019. Underlying subpleural reticulation, traction bronchiectasis/bronchiolectasis and scattered honeycombing, similar to minimally increased from 07/03/2019. Calcified granulomas. No pleural fluid. Airway is unremarkable. No air trapping.  Upper Abdomen: Visualized portions of the liver, adrenal glands, kidneys, spleen, pancreas, stomach and bowel are unremarkable with the exception of a small hiatal hernia. Cholecystectomy. Calcified upper abdominal lymph nodes.  Musculoskeletal: Degenerative changes in the spine. No worrisome lytic or sclerotic lesions.  IMPRESSION: 1. Increased patchy pulmonary parenchymal ground-glass may be due to an atypical/viral pneumonia, including due to COVID-19. 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 2. Aortic atherosclerosis (ICD10-I70.0). Coronary artery calcification.   Electronically Signed   By: Lorin Picket M.D.   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   reports that she has never smoked. She has never used smokeless tobacco.  Past Surgical History:  Procedure Laterality Date  . CHOLECYSTECTOMY  2011  . WISDOM TOOTH EXTRACTION      Allergies  Allergen Reactions  . Penicillins Other (See Comments)    Reaction unknown occurred during childhood Has patient had a PCN reaction causing immediate rash, facial/tongue/throat swelling, SOB or lightheadedness  with hypotension: Unknown Has patient had a PCN reaction causing severe rash involving mucus membranes or skin necrosis: Unknown Has patient had a PCN reaction that required hospitalization: No Has patient had a PCN reaction occurring within the last 10 years: No If all of the above answers are "NO", then may proceed with Cephalosporin use.  Unsur  . Remicade [Infliximab] Other (See Comments)    Reaction unknown  . Sulfa Antibiotics Other (See Comments)    Reaction unknown  . Sulfamethoxazole Other (See Comments)  Unsure of reaction As a child not sure     Immunization History  Administered Date(s) Administered  . Influenza Split 04/12/2013, 04/12/2014, 03/14/2015, 04/23/2016  . Influenza, High Dose Seasonal PF 04/12/2017, 03/24/2018, 04/13/2019  . Influenza-Unspecified 04/03/2013, 04/22/2017  . Moderna SARS-COVID-2 Vaccination 08/25/2019, 09/22/2019, 03/07/2020  . Pneumococcal Conjugate-13 04/03/2013, 09/28/2014  . Pneumococcal Polysaccharide-23 07/13/2012, 07/24/2013  . Td 01/11/2018  . Tdap 12/10/2017    Family History  Problem Relation Age of Onset  . Asthma Mother   . Anemia Mother   . Polymyalgia rheumatica Mother   . COPD Father   . Pulmonary fibrosis Father   . Breast cancer Maternal Grandmother 7     Current Outpatient Medications:  .  amitriptyline (ELAVIL) 25 MG tablet, Take 25 mg by mouth at bedtime., Disp: , Rfl: 0 .  beta carotene w/minerals (OCUVITE) tablet, Take 1 tablet by mouth daily., Disp: , Rfl:  .  Biotin 10 MG TABS, Take 1 tablet by mouth daily., Disp: , Rfl:  .  cholecalciferol (VITAMIN D) 1000 UNITS tablet, Take 1,000 Units by mouth daily., Disp: , Rfl:  .  Estradiol 10 MCG TABS vaginal tablet, estradiol 10 mcg vaginal tablet, Disp: , Rfl:  .  gabapentin (NEURONTIN) 300 MG capsule, Take 600 mg by mouth at bedtime., Disp: , Rfl:  .  HYDROcodone-homatropine (HYCODAN) 5-1.5 MG/5ML syrup, Take 5 mLs by mouth every 6 (six) hours as needed for  cough., Disp: 120 mL, Rfl: 0 .  ipratropium-albuterol (DUONEB) 0.5-2.5 (3) MG/3ML SOLN, Take 3 mLs by nebulization 3 (three) times daily for 5 days. (Patient taking differently: Take 3 mLs by nebulization as needed. ), Disp: 45 mL, Rfl: 1 .  lactulose (CHRONULAC) 10 GM/15ML solution, Take 45 mLs (30 g total) by mouth 2 (two) times daily. (Patient taking differently: Take 30 g by mouth as needed. ), Disp: 1892 mL, Rfl: 1 .  losartan (COZAAR) 50 MG tablet, Take 1 tablet (50 mg total) by mouth daily., Disp: 30 tablet, Rfl: 1 .  melatonin 5 MG TABS, Take 1 tablet (5 mg total) by mouth at bedtime., Disp: 30 tablet, Rfl: 0 .  methylPREDNISolone (MEDROL) 4 MG tablet, Take 1 tablet (4 mg total) by mouth every other day. Alternate with the $Remove'2mg'GGQcwQa$  dose, Disp:  , Rfl:  .  mometasone-formoterol (DULERA) 100-5 MCG/ACT AERO, Inhale 2 puffs into the lungs 2 (two) times daily., Disp: 39 g, Rfl: 0 .  Nintedanib (OFEV) 100 MG CAPS, Take 1 capsule (100 mg total) by mouth 2 (two) times daily., Disp: 180 capsule, Rfl: 3 .  pantoprazole (PROTONIX) 40 MG tablet, Take 1 tablet (40 mg total) by mouth 2 (two) times daily before a meal., Disp: 180 tablet, Rfl: 3 .  polyethylene glycol (MIRALAX / GLYCOLAX) 17 g packet, Take 17 g by mouth 2 (two) times daily. (Patient taking differently: Take 17 g by mouth as needed. ), Disp: 72 each, Rfl: 1 .  pravastatin (PRAVACHOL) 20 MG tablet, Take 1 tablet (20 mg total) by mouth every evening., Disp: 90 tablet, Rfl: 3 .  riTUXimab (RITUXAN) 100 MG/10ML injection, Inject into the vein. Receives at [facility] but unsure of dose - no more doses for next 6 months as of 09/14/18, Disp: , Rfl:  .  senna-docusate (SENOKOT-S) 8.6-50 MG tablet, Take 2 tablets by mouth 2 (two) times daily. (Patient taking differently: Take 2 tablets by mouth as needed. ), Disp: 120 tablet, Rfl: 1 .  venlafaxine XR (EFFEXOR-XR) 75 MG 24 hr capsule, Take 75  mg by mouth at bedtime. , Disp: , Rfl:       Objective:   Vitals:    04/10/20 1048  BP: 124/78  Pulse: 88  Temp: (!) 97.3 F (36.3 C)  TempSrc: Temporal  SpO2: 96%  Weight: 142 lb 12.8 oz (64.8 kg)  Height: $Remove'5\' 6"'zDoeIBn$  (1.676 m)    Estimated body mass index is 23.05 kg/m as calculated from the following:   Height as of this encounter: $RemoveBeforeD'5\' 6"'otSBbjiOwbYQLU$  (1.676 m).   Weight as of this encounter: 142 lb 12.8 oz (64.8 kg).  $Rem'@WEIGHTCHANGE'cWfh$ @  [employer]   04/10/20 1048  Weight: 142 lb 12.8 oz (64.8 kg)     Physical Exam  Frail female with evidence of rheumatoid arthritis and clubbing.  Extensive crackles bilaterally.  Abdomen soft normal heart sounds.  Alert and oriented x3.  Thin       Assessment:       ICD-10-CM   1. Interstitial lung disease due to connective tissue disease (HCC)  J84.89 Hepatic function panel   M35.9 Glucose 6 phosphate dehydrogenase    Glucose 6 phosphate dehydrogenase    Hepatic function panel  2. Encounter for long-term current use of high risk medication  Z79.899   3. Moderate persistent asthma without complication  T06.26   4. Dizziness  R42   5. Chronic cough  R05   6. Chronic fatigue  R53.82        Plan:     Patient Instructions        ICD-10-CM   1. Interstitial lung disease due to connective tissue disease ([place])  J84.89    M35.9   2. Encounter for long-term current use of high risk medication  Z79.899   3. Moderate persistent asthma without complication  R48.54   4. Dizziness  R42   5. Chronic cough  R05   6. 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 You have recovered from that based on today' walk and symptoms score and fact you are not needing o2 at rest/walk  - . Still, overall over time the last 1-2 years, your ILD is  progressive   plan  - continue ofev $RemoveBefor'100mg'ZbGShMaLWCpJ$  once daily     - do not wan tto increase doseage due to dizziness and already mild nausea, diarrhea and overall fatigue etc., - - check LFT 04/10/2020  --Continue  Rituxan in setting of Ofev -Next dose September 2021 per Dr. Stann Mainland = Medrol for rheumatoid arthritis  $RemoveBe'4mg'sMGPlicQq$  alternating with $RemoveBeforeDE'2mg'qgCXLxkQXGTPnkB$  per day - check blood G6PD - if normal - will start bactrim for prevention of opportunistic infections  - this strategy might help prevent your concern of recurrent flares and cough   #Symptoms burden of cough, fatigue, dizziness  - address dizziness with neuro - hold off on others for now - more medications for these means more side effects  Followup - 3 months - 30 min visit - symptoms score and walk test  - I will decide on future pft and CT decision at time of followup     SIGNATURE    Dr. Brand Males, M.D., F.C.C.P,  Pulmonary and Critical Care Medicine Staff Physician, [facility] Director - Interstitial Lung Disease  Program  [place] [facility] [place], [place], [address]  Pager: [phone], If no answer or between  15:00h - 7:00h: call [phone] Telephone: [phone]  12:30 PM 04/10/2020

## 2020-04-11 LAB — GLUCOSE 6 PHOSPHATE DEHYDROGENASE: G-6PDH: 16.3 U/g Hgb (ref 7.0–20.5)

## 2020-04-12 DIAGNOSIS — M0579 Rheumatoid arthritis with rheumatoid factor of multiple sites without organ or systems involvement: Secondary | ICD-10-CM | POA: Diagnosis not present

## 2020-04-12 DIAGNOSIS — Z79899 Other long term (current) drug therapy: Secondary | ICD-10-CM | POA: Diagnosis not present

## 2020-04-12 DIAGNOSIS — M810 Age-related osteoporosis without current pathological fracture: Secondary | ICD-10-CM | POA: Diagnosis not present

## 2020-04-15 ENCOUNTER — Telehealth: Payer: Self-pay | Admitting: Internal Medicine

## 2020-04-15 NOTE — Telephone Encounter (Signed)
Attempted to call pt but unable to reach. Left message for her to return call. 

## 2020-04-15 NOTE — Telephone Encounter (Signed)
Her liver function tests and G6PD blood work is normal  Plan -Start Bactrim 1 double strength -once a day on Monday, Wednesday and Friday only -This is for opportunistic infection prophylaxis in the setting of Rituxan and steroids -

## 2020-04-16 MED ORDER — SULFAMETHOXAZOLE-TRIMETHOPRIM 800-160 MG PO TABS: 1.0000 | ORAL_TABLET | ORAL | 5 refills | Status: DC

## 2020-04-16 NOTE — Telephone Encounter (Signed)
Called and spoke with pt letting her know the results of labwork and that MR wants to start her on bactrim as a prophylactic for opportunistic infection. Stated to her that she will take this three times weekly. Pt verbalized understanding. Verified pt's preferred pharmacy and sent Rx in for pt. Nothing further needed.

## 2020-04-21 ENCOUNTER — Encounter: Payer: Self-pay | Admitting: Cardiology

## 2020-04-27 DIAGNOSIS — Z23 Encounter for immunization: Secondary | ICD-10-CM | POA: Diagnosis not present

## 2020-05-01 ENCOUNTER — Other Ambulatory Visit (HOSPITAL_COMMUNITY): Payer: Self-pay | Admitting: Interventional Radiology

## 2020-05-01 DIAGNOSIS — I771 Stricture of artery: Secondary | ICD-10-CM

## 2020-05-14 DIAGNOSIS — M0579 Rheumatoid arthritis with rheumatoid factor of multiple sites without organ or systems involvement: Secondary | ICD-10-CM | POA: Diagnosis not present

## 2020-05-14 DIAGNOSIS — M810 Age-related osteoporosis without current pathological fracture: Secondary | ICD-10-CM | POA: Diagnosis not present

## 2020-06-04 DIAGNOSIS — M6289 Other specified disorders of muscle: Secondary | ICD-10-CM | POA: Diagnosis not present

## 2020-06-04 DIAGNOSIS — M62838 Other muscle spasm: Secondary | ICD-10-CM | POA: Diagnosis not present

## 2020-06-04 DIAGNOSIS — N393 Stress incontinence (female) (male): Secondary | ICD-10-CM | POA: Diagnosis not present

## 2020-06-04 DIAGNOSIS — R3915 Urgency of urination: Secondary | ICD-10-CM | POA: Diagnosis not present

## 2020-06-04 DIAGNOSIS — R350 Frequency of micturition: Secondary | ICD-10-CM | POA: Diagnosis not present

## 2020-06-04 DIAGNOSIS — M6281 Muscle weakness (generalized): Secondary | ICD-10-CM | POA: Diagnosis not present

## 2020-06-12 ENCOUNTER — Ambulatory Visit: Payer: Medicare Other | Admitting: Internal Medicine

## 2020-06-12 ENCOUNTER — Encounter: Payer: Self-pay | Admitting: Pulmonary Disease

## 2020-06-12 ENCOUNTER — Ambulatory Visit (INDEPENDENT_AMBULATORY_CARE_PROVIDER_SITE_OTHER): Payer: Medicare Other | Admitting: Pulmonary Disease

## 2020-06-12 ENCOUNTER — Other Ambulatory Visit: Payer: Self-pay

## 2020-06-12 ENCOUNTER — Ambulatory Visit (INDEPENDENT_AMBULATORY_CARE_PROVIDER_SITE_OTHER): Payer: Medicare Other

## 2020-06-12 VITALS — BP 118/70 | HR 80 | Temp 97.7°F | Ht 66.0 in | Wt 140.6 lb

## 2020-06-12 DIAGNOSIS — M069 Rheumatoid arthritis, unspecified: Secondary | ICD-10-CM | POA: Diagnosis not present

## 2020-06-12 DIAGNOSIS — J849 Interstitial pulmonary disease, unspecified: Secondary | ICD-10-CM

## 2020-06-12 DIAGNOSIS — R0609 Other forms of dyspnea: Secondary | ICD-10-CM

## 2020-06-12 DIAGNOSIS — R0600 Dyspnea, unspecified: Secondary | ICD-10-CM | POA: Diagnosis not present

## 2020-06-12 DIAGNOSIS — R053 Chronic cough: Secondary | ICD-10-CM | POA: Diagnosis not present

## 2020-06-12 DIAGNOSIS — I2510 Atherosclerotic heart disease of native coronary artery without angina pectoris: Secondary | ICD-10-CM

## 2020-06-12 MED ORDER — BENZONATATE 200 MG PO CAPS: 200.0000 mg | ORAL_CAPSULE | Freq: Three times a day (TID) | ORAL | 2 refills | Status: DC | PRN

## 2020-06-12 NOTE — Assessment & Plan Note (Signed)
Plan: Continue Ofev 100 mg daily Continue Rituxan Continue Medrol dosing as outlined by rheumatology Referral to pulmonary rehab Start taking Bactrim Monday Wednesday Friday for P JP prophylaxis at full dosing Upcoming appointment with Dr. Chase Caller in January/2022 Spirometry with DLCO in March/2022 Tessalon Perles for management of cough

## 2020-06-12 NOTE — Progress Notes (Deleted)
ff

## 2020-06-12 NOTE — Assessment & Plan Note (Signed)
Plan: Okay to use over-the-counter cough medicine as needed for cough Tessalon Perles sent in today

## 2020-06-12 NOTE — Patient Instructions (Addendum)
You were seen today by Lauraine Rinne, NP  for:   1. ILD (interstitial lung disease) ([place])  - Pulmonary function test; Future  Continue OFEV 100mg  daily  Continue Rituxan infusions  Continue Medrol dosing as outlined by rheumatology  Continue follow-up with rheumatology  We will obtain pulmonary function testing in March/2022-spirometry with DLCO  2. Chronic cough  - benzonatate (TESSALON) 200 MG capsule; Take 1 capsule (200 mg total) by mouth 3 (three) times daily as needed for cough.  Dispense: 30 capsule; Refill: 2  Okay to continue to use over-the-counter cough medicine as needed for management of cough  Tessalon Perles are also prescribed today  Notify our office if you are having worsening cough or symptoms for 5 to 7 days or more  We recommend today:  Orders Placed This Encounter  Procedures  . Pulmonary function test    March/2022    Standing Status:   Future    Standing Expiration Date:   06/12/2021    Scheduling Instructions:     Arlyce Harman with dlco    Order Specific Question:   Where should this test be performed?    Answer:   [facility]   Orders Placed This Encounter  Procedures  . Pulmonary function test   Meds ordered this encounter  Medications  . benzonatate (TESSALON) 200 MG capsule    Sig: Take 1 capsule (200 mg total) by mouth 3 (three) times daily as needed for cough.    Dispense:  30 capsule    Refill:  2    Follow Up:    Return in about 7 weeks (around 08/01/2020), or if symptoms worsen or fail to improve, for Follow up with Dr. Purnell Shoemaker, ILD clinic - 43min slot. Also please schedule 30-minute pulmonary function test in March/2022  Notification of test results are managed in the following manner: If there are  any recommendations or changes to the  plan of care discussed in office today,  we will contact you and let you know what they are. If you do not hear from [place], then your results are normal and you can view them through your  MyChart  account , or a letter will be sent to you. Thank you again for trusting [place] with your care  - Thank you, [facility]    It is flu season:   >>> Best ways to protect herself from the flu: Receive the yearly flu vaccine, practice good hand hygiene washing with soap and also using hand sanitizer when available, eat a nutritious meals, get adequate rest, hydrate appropriately       Please contact the office if your symptoms worsen or you have concerns that you are not improving.   Thank you for choosing [facility] for your healthcare, and for allowing [place] to partner with you on your healthcare journey. I am thankful to be able to provide care to you today.   Wyn Quaker FNP-C

## 2020-06-12 NOTE — Progress Notes (Signed)
@Patient  ID: Stacey [name], female    DOB: December 25, 1940, 79 y.o.   MRN: [id number]  Chief Complaint  Patient presents with  . 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 Chase Caller  06/12/2020  - Visit   79 year old female never smoker followed in our office for interstitial lung disease felt to be related to rheumatoid arthritis.  Established with Dr. Chase Caller.  Patient presenting to our office today as a follow-up visit.  She was last seen in September/2021.  She was seen by Dr. Chase Caller.  Plan of care from that office visit was for her to remain on [facility].  Continue acid reflux medications.  Overall her ILD was felt to be stable but it can be progressive.  She was encouraged to remain on Rituxan.  As well as remain on Ofev 100 mg daily.  Not on full Ofev dosing due to dizziness and on mild nausea and diarrhea at full dosing.  Lab work was checked.  It was suggested the patient consider start Bactrim for PJP prophylaxis.  She is also encouraged to remain on 4 mg and 2 mg of Medrol alternating dosing for management of rheumatoid arthritis.  Patient was started on Bactrim in October/2021.  Patient has an allergy to sulfa drugs.  Patient is unsure why her allergy to sulfa drugs is.  She reports that she started taking the Bactrim Monday Wednesday Friday but only at half dosing.  She is tolerated this without any issue.  She is wondering if she should go to full dosing.  She  reports that she was always told that she had an allergic reaction when she was a child.  Patient did a 6-minute walk today.  Overall she tolerated well but did have to stop with each lap to pause and take a few breaths.  She is up-to-date with COVID-19 vaccinations.  Up-to-date with flu vaccination.  Patient does admit that she leads a fairly sedentary lifestyle she is not as active as she would like to be.  We will discuss this today.  Questionaires / Pulmonary Flowsheets:   ACT:  No flowsheet data found.  MMRC: No flowsheet data found.  Epworth:  No flowsheet data found.  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  11/06/2016-CT chest high-res- compatible with ILD, 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, chronic areas of pleural-parenchymal thickening and calcification again noted throughout the apical portions of the lungs bilaterally most compatible with areas of chronic postinfectious or inflammatory scarring  07/21/2018-pulmonary function test- FVC 2.18 (77% predicted), FEV1 1.72 (81% predicted) ratio 79 Normal spirometry with possible restriction 01/24/2018-echocardiogram-LV ejection fraction 55 to 60%,  grade 1 diastolic dysfunction   FENO:  Lab Results  Component Value Date   NITRICOXIDE 30 02/25/2018    PFT: PFT Results Latest Ref Rng & Units 04/08/2020 12/12/2019 03/27/2019 07/21/2018 06/07/2018 05/17/2018 01/10/2018  FVC-Pre L 1.83 1.90 1.79 2.18 1.81 1.99 1.93   FVC-Predicted Pre % 66 68 64 77 68 75 65  FVC-Post L - - - - - - 1.99  FVC-Predicted Post % - - - - - - 67  Pre FEV1/FVC % % 74 78 79 79 69 75 77  Post FEV1/FCV % % - - - - - - 78  FEV1-Pre L 1.35 1.48 1.42 1.72 1.25 1.50 1.48  FEV1-Predicted Pre % 66 71 68 81 63 76 67  FEV1-Post L - - - - - - 1.55  DLCO uncorrected ml/min/mmHg 14.39 13.91 15.04 - 14.06 16.58 16.09  DLCO UNC% % 74 71 77 - 59 70 59  DLCO corrected ml/min/mmHg 15.49 13.91 - - - 17.64 -  DLCO COR %Predicted % 79 71 - - - 74 -  DLVA Predicted % 108 86 96 - 80 97 81  TLC L - - - - - - 4.40  TLC % Predicted % - - - - - - 82  RV % Predicted % - - - - - - 102    WALK:  SIX MIN WALK 06/12/2020 04/10/2020 12/18/2019 07/20/2019 05/24/2019 07/21/2018 04/12/2018  Medications losartan 50mg , medrol 4mg , ofev 100mg  at 8:30 - - - - - -  Supplimental Oxygen during Test? (L/min) No No No No No No No  Laps 10 - - - - - -  Partial Lap (in Meters) 0 - - - - - -  Baseline BP (sitting) 118/70 - - - - - -  Baseline Heartrate 80 - - - - - -  Baseline Dyspnea (Borg Scale) 3 - - - - - -  Baseline Fatigue (Borg Scale) 3 - - - - - -  Baseline SPO2 96 - - - - - -  BP (sitting) 160/90 - - - - - -  Heartrate 101 - - - - - -  Dyspnea (Borg Scale) 4 - - - - - -  Fatigue (Borg Scale) 7 - - - - - -  SPO2 91 - - - - - -  BP (sitting) 130/80 - - - - - -  Heartrate 84 - - - - - -  SPO2 94 - - - - - -  Stopped or Paused before Six Minutes Yes - - - - - -  Other Symptoms at end of Exercise Pt paused for a few seconds after each lap to catch breath - - - - - -  Interpretation Dizziness - - - - - -  Distance Completed 340 - - - - - -  Distance Completed 0 - - - - - -  Tech Comments: Pt walked at a normal pace having to stop for a few seconds after each lap finished to catch her breath. Pt did have complaints of dizziness which is not a new symptom. Pt was able to complete the entire 6 min. Patient walked slow pace and maintained good sats walked at a  moderate pace, stopped once to catch her breath.  sob after the first lap.  sat down to 93% at the lowest. Completed 2 laps, unsteady at times pt walked at a slow pace completing all required laps having complaints of moderate SOB Pt walked at a normal pace completing all required  laps. Pt had mild SOB. Pt walked at a normal pace completing all required laps denying any complaints.    Imaging: No results found.  Lab Results:  CBC    Component Value Date/Time   WBC 11.3 (H) 02/15/2020 0524   RBC 3.69 (L) 02/15/2020 0524   HGB 11.3 (L) 02/15/2020 0524   HGB LA11 03/26/2018 0507   HCT 35.3 (L) 02/15/2020 0524   PLT 250 02/15/2020 0524   MCV 95.7 02/15/2020 0524   MCH 30.6 02/15/2020 0524   MCHC 32.0 02/15/2020 0524   RDW 13.9 02/15/2020 0524   LYMPHSABS 1.8 02/11/2020 0750   MONOABS 2.1 (H) 02/11/2020 0750   EOSABS 0.0 02/11/2020 0750   BASOSABS 0.0 02/11/2020 0750    BMET    Component Value Date/Time   NA 135 02/15/2020 0524   K 4.4 02/15/2020 0524   CL 98 02/15/2020 0524   CO2 30 02/15/2020 0524   GLUCOSE 85 02/15/2020 0524   BUN 10 02/15/2020 0524   CREATININE 0.51 02/15/2020 0524   CALCIUM 8.5 (L) 02/15/2020 0524   GFRNONAA >60 02/15/2020 0524   GFRAA >60 02/15/2020 0524    BNP    Component Value Date/Time   BNP 89.4 03/23/2018 0732    ProBNP    Component Value Date/Time   PROBNP 38.0 03/21/2018 1119    Specialty Problems      Pulmonary Problems   Asthma   Cough variant asthma vs uacs     Followed in Pulmonary clinic/ [facility]/ [facility] - 04/18/15 rec max rx for gerd > resolve - recurred Feb 2018 > max gerd rx added 09/25/2016  - 09/25/2016  After extensive coaching HFA effectiveness =    75% so try symbicort 80 2bid x one sample only  - improved on higher doses of pred and saba by report 10/22/2016 so rec medrol 4 mg double dose until better then resume previous dose  - flutter valve teaching done 10/22/2016  And repeated 01/17/2018 as well as neb  training  - refractory flare onset 01/11/18 rec medrol up to 32 qd and cyclical cough rx with tyl#3  - DgEs  01/25/18    Spontaneous reflux to level of the midthoracic esophagus when patient is placed in the supine position - Asthma exac. 02/25/18  FENO 30, medrol taper (12mg  x 3 days, 8mg  x 3 days then cont 4mg  qd) - Allergy profile 03/21/18  >  Eos 1.1  /  IgE  5 rast neg      Multiple pulmonary nodules/RA lung dz     Followed in Pulmonary clinic/ [facility]/ [facility]  - See CT 05/16/13  1. The suspected pulmonary nodule in the right lung apex is actually  a calcified posterior pleural plaque and is benign in appearance.  2. Nodularity in the lateral aspect of the right upper lobe is felt  to be inflammatory in origin. However, there is a 9 mm nodule in  this area.  - PFT's 07/17/13 VC 74% with no obst and dlco 69 correctrs to 85% - CT 10/16/13 1. Stable chronic lung disease with scattered subpleural scarring and asymmetric right upper lobe subpleural nodularity, likely postinflammatory (prior granulomatous disease). 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 2. No suspicious pulmonary nodule or acute process demonstrated. - CT 10/21/2015 Mild growth of irregular 1.0 cm subpleural left upper lobe pulmonary nodule since 10/16/2013, cannot exclude an indolent primary bronchogenic carcinoma.  -  PET 11/01/2015 > neg with ild c/w RA - PFT's  02/07/2016  VC  1.99 (65%)   study with DLCO  67/68 % corrects to 89 % for alv volume   - HRCT rec p 10 days of levaquin 10/23/2016  - HRCT 11/06/2016  1. The appearance of the chest remains compatible with interstitial lung disease. 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 No significant progression of findings compared to the prior study. 2. Chronic areas of pleural-parenchymal thickening/calcification again noted throughout the apical portions of the lungs  bilaterally, most compatible with areas of chronic post infectious or inflammatory scarring. - PFT's  01/11/2017   VC 1.91 (64%) s obst or curvature   p nothing prior to study with DLCO  61/59c % corrects to 74  % for alv volume   - PFT's  01/10/2018  VC  1.88 (63%) s significant obst p nothing prior to study with DLCO  59 % corrects to 81  % for alv volume          Restrictive lung disease    Followed in Pulmonary clinic/ [facility]/ [facility] - PFT's 07/17/13 VC 74% with no obst and dlco 69 correctrs to 85% - PFT's 01/11/2017  VC  64% and dlco 61/69 c corrects to 74  - PFTs 01/10/18 - FVC 1.99 (67%), FEV1 1.55 (70%), Ratio 78, DLCOunc 16.09 (59%)      Dyspnea   ILD (interstitial lung disease) (HCC)   Chronic lung disease   Hypoxia   DOE (dyspnea on exertion)   Asthma exacerbation   Chronic respiratory failure with hypoxia (HCC)   Acute bronchitis   Acute asthma exacerbation   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[place])   Pneumonia due to COVID-19 virus   Acute on chronic respiratory failure with hypoxia (HCC)   CAP (community acquired pneumonia)   Chronic cough      Allergies  Allergen Reactions  . Penicillins Other (See Comments)    Reaction unknown occurred during childhood Has patient had a PCN reaction causing immediate rash, facial/tongue/throat swelling, SOB or lightheadedness with hypotension: Unknown Has patient had a PCN reaction causing severe rash involving mucus membranes or skin necrosis: Unknown Has patient had a PCN reaction that required hospitalization: No Has patient had a PCN reaction occurring within the last 10 years: No If all of the above answers are "NO", then may proceed with Cephalosporin use.  Unsur  . Remicade [Infliximab] Other (See Comments)    Reaction unknown  . Sulfa Antibiotics Other (See Comments)    Reaction unknown    Immunization History  Administered Date(s) Administered  . Fluad Quad(high Dose 65+) 04/23/2020  . Influenza Split 04/12/2013,  04/12/2014, 03/14/2015, 04/23/2016  . Influenza, High Dose Seasonal PF 04/12/2017, 03/24/2018, 04/13/2019  . Influenza-Unspecified 04/03/2013, 04/22/2017  . Moderna SARS-COVID-2 Vaccination 08/25/2019, 09/22/2019, 03/07/2020  . Pneumococcal Conjugate-13 04/03/2013, 09/28/2014  . Pneumococcal Polysaccharide-23 07/13/2012, 07/24/2013  . Td 01/11/2018  . Tdap 12/10/2017    Past Medical History:  Diagnosis Date  . Abnormal finding of blood chemistry   . Asthma   . H/O measles   . H/O varicella   . Hypertension   . 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[place])   . 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 Lichen sclerosus 68/61/68   Asymptomatic  . Low iron   . Mitral valve prolapse   . Osteoarthritis   . Osteoporosis   . Pneumonia   . Post herpetic neuralgia   . Rheumatoid arthritis(714.0)   . Yeast infection     Tobacco  History: Social History   Tobacco Use  Smoking Status Never Smoker  Smokeless Tobacco Never Used   Counseling given: Not Answered   Continue to not smoke  Outpatient Encounter Medications as of 06/12/2020  Medication Sig  . amitriptyline (ELAVIL) 25 MG tablet Take 25 mg by mouth at bedtime.  . beta carotene w/minerals (OCUVITE) tablet Take 1 tablet by mouth daily.  . cholecalciferol (VITAMIN D) 1000 UNITS tablet Take 1,000 Units by mouth daily.  . Estradiol 10 MCG TABS vaginal tablet Place 10 mcg vaginally daily as needed (dryness).   . gabapentin (NEURONTIN) 300 MG capsule Take 600 mg by mouth at bedtime.  Marland Kitchen HYDROcodone-homatropine (HYCODAN) 5-1.5 MG/5ML syrup Take 5 mLs by mouth every 6 (six) hours as needed for cough. (Patient not taking: Reported on 06/12/2020)  . ipratropium-albuterol (DUONEB) 0.5-2.5 (3) MG/3ML SOLN Take 3 mLs by nebulization 3 (three) times daily for 5 days. (Patient taking differently: Take 3 mLs by nebulization every 4 (four) hours as needed (shortness of breath). )  . lactulose (CHRONULAC) 10 GM/15ML solution Take 45 mLs (30 g total) by mouth 2 (two)  times daily. (Patient not taking: Reported on 06/12/2020)  . losartan (COZAAR) 50 MG tablet Take 1 tablet (50 mg total) by mouth daily.  . melatonin 5 MG TABS Take 1 tablet (5 mg total) by mouth at bedtime. (Patient not taking: Reported on 06/12/2020)  . methylPREDNISolone (MEDROL) 4 MG tablet Take 1 tablet (4 mg total) by mouth every other day. Alternate with the 2mg  dose (Patient taking differently: Take 4 mg by mouth daily. )  . mometasone-formoterol (DULERA) 100-5 MCG/ACT AERO Inhale 2 puffs into the lungs 2 (two) times daily. (Patient taking differently: Inhale 2 puffs into the lungs daily. )  . Nintedanib (OFEV) 100 MG CAPS Take 1 capsule (100 mg total) by mouth 2 (two) times daily.  . pantoprazole (PROTONIX) 40 MG tablet Take 1 tablet (40 mg total) by mouth 2 (two) times daily before a meal.  . polyethylene glycol (MIRALAX / GLYCOLAX) 17 g packet Take 17 g by mouth 2 (two) times daily. (Patient taking differently: Take 17 g by mouth daily as needed for moderate constipation. )  . pravastatin (PRAVACHOL) 20 MG tablet Take 1 tablet (20 mg total) by mouth every evening.  . riTUXimab (RITUXAN) 100 MG/10ML injection Inject 100 mg into the vein every 6 (six) months.   . senna-docusate (SENOKOT-S) 8.6-50 MG tablet Take 2 tablets by mouth 2 (two) times daily. (Patient taking differently: Take 1 tablet by mouth daily as needed for mild constipation. )  . sulfamethoxazole-trimethoprim (BACTRIM DS) 800-160 MG tablet Take 1 tablet by mouth 3 (three) times a week. Take on Mon, Wed, Fri (Patient taking differently: Take 1 tablet by mouth every Monday, Wednesday, and Friday. )  . venlafaxine XR (EFFEXOR-XR) 75 MG 24 hr capsule Take 75 mg by mouth at bedtime.   . [DISCONTINUED] Biotin 10 MG TABS Take 10 mg by mouth daily.   . benzonatate (TESSALON) 200 MG capsule Take 1 capsule (200 mg total) by mouth 3 (three) times daily as needed for cough.   No facility-administered encounter medications on file as of  06/12/2020.     Review of Systems  Review of Systems  Constitutional: Positive for fatigue (very tired ). Negative for activity change and fever.  HENT: Negative for sinus pressure, sinus pain and sore throat.   Respiratory: Positive for shortness of breath (mmrc 3). Negative for cough and wheezing.   Cardiovascular: Negative  for chest pain and palpitations.  Gastrointestinal: Negative for diarrhea, nausea and vomiting.  Musculoskeletal: Negative for arthralgias.  Neurological: Negative for dizziness.  Psychiatric/Behavioral: Negative for sleep disturbance. The patient is not nervous/anxious.      Physical Exam  BP 118/70 (BP Location: Left Arm, Patient Position: Sitting, Cuff Size: Normal)   Pulse 80   Temp 97.7 F (36.5 C) (Temporal)   Ht 5\' 6"  (1.676 m)   Wt 140 lb 9.6 oz (63.8 kg)   SpO2 96%   BMI 22.69 kg/m   Wt Readings from Last 5 Encounters:  06/12/20 140 lb 9.6 oz (63.8 kg)  04/10/20 142 lb 12.8 oz (64.8 kg)  04/02/20 142 lb 5 oz (64.6 kg)  02/23/20 146 lb (66.2 kg)  02/22/20 140 lb 9.6 oz (63.8 kg)    BMI Readings from Last 5 Encounters:  06/12/20 22.69 kg/m  04/10/20 23.05 kg/m  04/02/20 22.97 kg/m  02/23/20 23.57 kg/m  02/22/20 22.69 kg/m     Physical Exam Vitals and nursing note reviewed.  Constitutional:      General: She is not in acute distress.    Appearance: Normal appearance. She is normal weight.  HENT:     Head: Normocephalic and atraumatic.     Right Ear: Tympanic membrane, ear canal and external ear normal. There is no impacted cerumen.     Left Ear: Tympanic membrane, ear canal and external ear normal. There is no impacted cerumen.     Nose: Nose normal. No congestion.     Mouth/Throat:     Mouth: Mucous membranes are moist.     Pharynx: Oropharynx is clear.  Eyes:     Pupils: Pupils are equal, round, and reactive to light.  Cardiovascular:     Rate and Rhythm: Normal rate and regular rhythm.     Pulses: Normal pulses.      Heart sounds: Normal heart sounds. No murmur heard.   Pulmonary:     Effort: Pulmonary effort is normal. No respiratory distress.     Breath sounds: No decreased air movement. Rales present. No decreased breath sounds or wheezing.  Musculoskeletal:     Cervical back: Normal range of motion.     Right lower leg: No edema.     Left lower leg: No edema.  Skin:    General: Skin is warm and dry.     Capillary Refill: Capillary refill takes less than 2 seconds.  Neurological:     General: No focal deficit present.     Mental Status: She is alert and oriented to person, place, and time. Mental status is at baseline.     Gait: Gait normal.  Psychiatric:        Mood and Affect: Mood normal.        Behavior: Behavior normal.        Thought Content: Thought content normal.        Judgment: Judgment normal.       Assessment & Plan:   ILD (interstitial lung disease) ([place]) Plan: Continue Ofev 100 mg daily Continue Rituxan Continue Medrol dosing as outlined by rheumatology Referral to pulmonary rehab Start taking Bactrim Monday Wednesday Friday for P JP prophylaxis at full dosing Upcoming appointment with Dr. Chase Caller in January/2022 Spirometry with DLCO in March/2022 Tessalon Perles for management of cough  Rheumatoid arthritis [facility]) Plan: Continue follow-up with rheumatology Continue Rituxan Continue Medrol dosing 4 mg and 2 mg routinely every other day Continue PJP prophylaxis-increase to full dosing of Bactrim Monday Wednesday  Friday  DOE (dyspnea on exertion) Certainly multifactorial given known interstitial lung disease and rheumatoid arthritis.  As well as physical deconditioning.  Plan: Referral to pulmonary rehab due to known interstitial lung disease Patient would not like to start pulmonary rehab until after January/2022  Chronic cough Plan: Okay to use over-the-counter cough medicine as needed for cough Tessalon Perles sent in today    Return in about 7 weeks  (around 08/01/2020), or if symptoms worsen or fail to improve, for Follow up with Dr. Purnell Shoemaker, ILD clinic - 56min slot.   Lauraine Rinne, NP 06/12/2020   This appointment required 42 minutes of patient care (this includes precharting, chart review, review of results, face-to-face care, etc.).

## 2020-06-12 NOTE — Assessment & Plan Note (Signed)
Certainly multifactorial given known interstitial lung disease and rheumatoid arthritis.  As well as physical deconditioning.  Plan: Referral to pulmonary rehab due to known interstitial lung disease Patient would not like to start pulmonary rehab until after January/2022

## 2020-06-12 NOTE — Assessment & Plan Note (Signed)
Plan: Continue follow-up with rheumatology Continue Rituxan Continue Medrol dosing 4 mg and 2 mg routinely every other day Continue PJP prophylaxis-increase to full dosing of Bactrim Monday Wednesday Friday

## 2020-06-12 NOTE — Progress Notes (Signed)
Six Minute Walk - 06/12/20 0933      Six Minute Walk   Medications taken before test (dose and time) losartan 50mg , medrol 4mg , ofev 100mg  at 8:30    Supplemental oxygen during test? No    Lap distance in meters  34 meters    Laps Completed 10    Partial lap (in meters) 0 meters    Baseline BP (sitting) 118/70    Baseline Heartrate 80    Baseline Dyspnea (Borg Scale) 3    Baseline Fatigue (Borg Scale) 3    Baseline SPO2 96 %      End of Test Values    BP (sitting) 160/90    Heartrate 101    Dyspnea (Borg Scale) 4    Fatigue (Borg Scale) 7    SPO2 91 %      2 Minutes Post Walk Values   BP (sitting) 130/80    Heartrate 84    SPO2 94 %    Stopped or paused before six minutes? Yes    Reason: Pt paused for a few seconds after each lap to catch breath    Other Symptoms at end of exercise: Dizziness   not a new symptom     Interpretation   Distance completed 340 meters    Tech Comments: Pt walked at a normal pace having to stop for a few seconds after each lap finished to catch her breath. Pt did have complaints of dizziness which is not a new symptom. Pt was able to complete the entire 6 min.

## 2020-06-14 ENCOUNTER — Telehealth (HOSPITAL_COMMUNITY): Payer: Self-pay

## 2020-06-14 NOTE — Telephone Encounter (Signed)
Pt insurance is active and benefits verified through Medicare a/b Co-pay 0, DED $203/$203 met, out of pocket 0/0 met, co-insurance 20%. no pre-authorization required.  2ndary insurance is active and benefits verified through [employer]. Co-pay 0, DED 0/0 met, out of pocket 0/0 met, co-insurance 0. No pre-authorization required.  

## 2020-06-17 ENCOUNTER — Other Ambulatory Visit: Payer: Self-pay | Admitting: Radiology

## 2020-06-18 ENCOUNTER — Other Ambulatory Visit: Payer: Self-pay

## 2020-06-18 ENCOUNTER — Ambulatory Visit (HOSPITAL_COMMUNITY)
Admission: RE | Admit: 2020-06-18 | Discharge: 2020-06-18 | Disposition: A | Discharge status: Home / Self Care | Payer: Medicare Other | Source: Ambulatory Visit | Attending: Interventional Radiology | Admitting: Interventional Radiology

## 2020-06-18 ENCOUNTER — Other Ambulatory Visit (HOSPITAL_COMMUNITY): Payer: Self-pay | Admitting: Interventional Radiology

## 2020-06-18 ENCOUNTER — Encounter (HOSPITAL_COMMUNITY): Payer: Self-pay

## 2020-06-18 DIAGNOSIS — E860 Dehydration: Secondary | ICD-10-CM | POA: Diagnosis not present

## 2020-06-18 DIAGNOSIS — I6503 Occlusion and stenosis of bilateral vertebral arteries: Secondary | ICD-10-CM | POA: Diagnosis not present

## 2020-06-18 DIAGNOSIS — I6602 Occlusion and stenosis of left middle cerebral artery: Secondary | ICD-10-CM | POA: Diagnosis not present

## 2020-06-18 DIAGNOSIS — Z880 Allergy status to penicillin: Secondary | ICD-10-CM | POA: Diagnosis not present

## 2020-06-18 DIAGNOSIS — Z888 Allergy status to other drugs, medicaments and biological substances status: Secondary | ICD-10-CM | POA: Insufficient documentation

## 2020-06-18 DIAGNOSIS — R42 Dizziness and giddiness: Secondary | ICD-10-CM | POA: Diagnosis not present

## 2020-06-18 DIAGNOSIS — I771 Stricture of artery: Secondary | ICD-10-CM

## 2020-06-18 DIAGNOSIS — E785 Hyperlipidemia, unspecified: Secondary | ICD-10-CM | POA: Insufficient documentation

## 2020-06-18 DIAGNOSIS — Z882 Allergy status to sulfonamides status: Secondary | ICD-10-CM | POA: Insufficient documentation

## 2020-06-18 DIAGNOSIS — Z79899 Other long term (current) drug therapy: Secondary | ICD-10-CM | POA: Diagnosis not present

## 2020-06-18 DIAGNOSIS — I959 Hypotension, unspecified: Secondary | ICD-10-CM | POA: Diagnosis not present

## 2020-06-18 DIAGNOSIS — Z8616 Personal history of COVID-19: Secondary | ICD-10-CM | POA: Insufficient documentation

## 2020-06-18 DIAGNOSIS — G450 Vertebro-basilar artery syndrome: Secondary | ICD-10-CM | POA: Insufficient documentation

## 2020-06-18 DIAGNOSIS — R55 Syncope and collapse: Secondary | ICD-10-CM | POA: Diagnosis not present

## 2020-06-18 HISTORY — PX: IR ANGIO VERTEBRAL SEL SUBCLAVIAN INNOMINATE UNI R MOD SED: IMG5365

## 2020-06-18 HISTORY — PX: IR ANGIO INTRA EXTRACRAN SEL COM CAROTID INNOMINATE BILAT MOD SED: IMG5360

## 2020-06-18 HISTORY — PX: IR ANGIO VERTEBRAL SEL VERTEBRAL UNI L MOD SED: IMG5367

## 2020-06-18 LAB — CBC WITH DIFFERENTIAL/PLATELET
Abs Immature Granulocytes: 0.02 10*3/uL (ref 0.00–0.07)
Basophils Absolute: 0.1 10*3/uL (ref 0.0–0.1)
Basophils Relative: 1 %
Eosinophils Absolute: 0.5 10*3/uL (ref 0.0–0.5)
Eosinophils Relative: 6 %
HCT: 44.1 % (ref 36.0–46.0)
Hemoglobin: 14.3 g/dL (ref 12.0–15.0)
Immature Granulocytes: 0 %
Lymphocytes Relative: 46 %
Lymphs Abs: 3.3 10*3/uL (ref 0.7–4.0)
MCH: 30.8 pg (ref 26.0–34.0)
MCHC: 32.4 g/dL (ref 30.0–36.0)
MCV: 95 fL (ref 80.0–100.0)
Monocytes Absolute: 1 10*3/uL (ref 0.1–1.0)
Monocytes Relative: 13 %
Neutro Abs: 2.4 10*3/uL (ref 1.7–7.7)
Neutrophils Relative %: 34 %
Platelets: 306 10*3/uL (ref 150–400)
RBC: 4.64 MIL/uL (ref 3.87–5.11)
RDW: 13.9 % (ref 11.5–15.5)
WBC: 7.2 10*3/uL (ref 4.0–10.5)
nRBC: 0 % (ref 0.0–0.2)

## 2020-06-18 LAB — BASIC METABOLIC PANEL
Anion gap: 10 (ref 5–15)
BUN: 11 mg/dL (ref 8–23)
CO2: 29 mmol/L (ref 22–32)
Calcium: 9.3 mg/dL (ref 8.9–10.3)
Chloride: 102 mmol/L (ref 98–111)
Creatinine, Ser: 0.94 mg/dL (ref 0.44–1.00)
GFR, Estimated: >60 mL/min (ref >60)
Glucose, Bld: 83 mg/dL (ref 70–99)
Potassium: 3.7 mmol/L (ref 3.5–5.1)
Sodium: 141 mmol/L (ref 135–145)

## 2020-06-18 LAB — APTT: aPTT: 26 seconds (ref 24–36)

## 2020-06-18 LAB — PROTIME-INR
INR: 1 (ref 0.8–1.2)
Prothrombin Time: 12.7 seconds (ref 11.4–15.2)

## 2020-06-18 MED ORDER — HEPARIN SODIUM (PORCINE) 1000 UNIT/ML IJ SOLN
INTRAMUSCULAR | Status: AC
  Filled 2020-06-18: qty 1

## 2020-06-18 MED ORDER — FENTANYL CITRATE (PF) 100 MCG/2ML IJ SOLN
INTRAMUSCULAR | Status: AC
  Filled 2020-06-18: qty 2

## 2020-06-18 MED ORDER — SODIUM CHLORIDE 0.9 % IV SOLN: INTRAVENOUS | Status: DC

## 2020-06-18 MED ORDER — LIDOCAINE HCL 1 % IJ SOLN
INTRAMUSCULAR | Status: AC
  Filled 2020-06-18: qty 20

## 2020-06-18 MED ORDER — IOHEXOL 300 MG/ML  SOLN: 50.0000 mL | Freq: Once | INTRAMUSCULAR | Status: DC | PRN

## 2020-06-18 MED ORDER — HEPARIN SODIUM (PORCINE) 1000 UNIT/ML IJ SOLN
INTRAMUSCULAR | Status: AC | PRN
  Administered 2020-06-18: 1000 [IU] via INTRAVENOUS

## 2020-06-18 MED ORDER — MIDAZOLAM HCL 2 MG/2ML IJ SOLN
INTRAMUSCULAR | Status: AC | PRN
  Administered 2020-06-18: 1 mg via INTRAVENOUS

## 2020-06-18 MED ORDER — IOHEXOL 300 MG/ML  SOLN: 150.0000 mL | Freq: Once | INTRAMUSCULAR | Status: DC | PRN

## 2020-06-18 MED ORDER — FENTANYL CITRATE (PF) 100 MCG/2ML IJ SOLN
INTRAMUSCULAR | Status: AC | PRN
Start: 2020-06-18 — End: 2020-06-18
  Administered 2020-06-18: 25 ug via INTRAVENOUS

## 2020-06-18 MED ORDER — MIDAZOLAM HCL 2 MG/2ML IJ SOLN
INTRAMUSCULAR | Status: AC
  Filled 2020-06-18: qty 2

## 2020-06-18 MED ORDER — SODIUM CHLORIDE 0.9 % IV SOLN: INTRAVENOUS | Status: AC

## 2020-06-18 NOTE — Sedation Documentation (Signed)
Attempted to call report to short stay. 

## 2020-06-18 NOTE — Procedures (Signed)
S/P 4 vessel cerebral arteriogram RT CFA approach.. Findings. 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 2.Approx 30 % stenosis origin of Lt [place]. 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 S.Makia Bossi MD

## 2020-06-18 NOTE — H&P (Signed)
Chief Complaint: Patient was seen in consultation today for Cerebral arteriogram at the request of Dr Aviva Signs Dr Star Age   Supervising Physician: Luanne Bras  Patient Status: [facility] - Out-pt  History of Present Illness: Stacey [name] is a 79 y.o. female   Hx HLD; interstitial lund disease; Covid Dec 2020 Hospitalization for hypoxia 02/2020--- on steroid po  Pt has had dizziness off and on for over 1 yr Has had falls at home because of it-- but not recently Has some double vision- with dizzy episodes when occur Occur 2-3x per week Denies N/V--- feels dizzy and off balance   Was seen by PMD Dr Aviva Signs and referred for Neuro eval 04/02/20 CTA Head/Neck 03/2020: IMPRESSION: 1. 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 2. Moderate narrowing of a proximal left M2 MCA branch. 3. Otherwise, no significant focal stenosis or occlusion in the neck or head. Diminutive basilar artery and hypoplastic or absent P1 PCAs is favored to reflect anatomic variation given prominent bilateral fetal type posterior communicating arteries. 4. Diffuse ventriculomegaly (similar when compared to 2017), which is slightly out of proportion to the degree of cerebral volume loss. While this could be ex vacuo in etiology, this finding can be seen with normal pressure hydrocephalus in the correct clinical setting. No periventricular edema. 5. Multilevel degenerative changes of the cervical spine, greatest at C5-C6 and C6-C7 where there is at least moderate canal stenosis. MRI of the cervical spine could better characterize the canal/cord, if clinically indicated. 6. Areas of ground-glass opacity with peripheral reticulation in the imaged upper   Referral for evaluation - possible management of cerebral artery stenosis per Dr Star Age   Past Medical History:   Diagnosis Date  . Abnormal finding of blood chemistry   . Asthma   . H/O measles   . H/O varicella   . Hypertension   . 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[place])   . 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 Lichen sclerosus 98/92/11   Asymptomatic  . Low iron   . Mitral valve prolapse   . Osteoarthritis   . Osteoporosis   . Pneumonia   . Post herpetic neuralgia   . Rheumatoid arthritis(714.0)   . Yeast infection     Past Surgical History:  Procedure Laterality Date  . CHOLECYSTECTOMY  2011  . WISDOM TOOTH EXTRACTION      Allergies: Penicillins, Remicade [infliximab], and Sulfa antibiotics  Medications: Prior to Admission medications   Medication Sig Start Date End Date Taking? Authorizing Provider  albuterol (VENTOLIN HFA) 108 (90 Base) MCG/ACT inhaler Inhale 2 puffs into the lungs every 6 (six) hours as needed for wheezing or shortness of breath.   Yes [provider]  amitriptyline (ELAVIL) 25 MG tablet Take 25 mg by mouth at bedtime. 02/04/15  Yes [provider]  benzonatate (TESSALON) 200 MG capsule Take 1 capsule (200 mg total) by mouth 3 (three) times daily as needed for cough. 06/12/20  Yes Lauraine Rinne, NP  beta carotene w/minerals (OCUVITE) tablet Take 1 tablet by mouth daily.   Yes [provider]  Biotin 1000 MCG tablet Take 1,000 mcg by mouth daily.   Yes [provider]  cholecalciferol (VITAMIN D) 1000 UNITS tablet Take 1,000 Units by mouth daily.   Yes [provider]  Estradiol 10 MCG TABS vaginal tablet Place 10 mcg vaginally daily as needed (dryness).    Yes [provider]  gabapentin (NEURONTIN) 300 MG capsule Take 600 mg by mouth at bedtime.   Yes [provider]  ipratropium-albuterol (DUONEB) 0.5-2.5 (3) MG/3ML SOLN Take 3 mLs by nebulization 3 (three) times daily for 5 days. Patient taking differently: Take 3 mLs by nebulization every 4 (four) hours as needed (shortness of breath).  02/15/20 06/12/20 Yes  Eugenie Filler, MD  losartan (COZAAR) 50 MG tablet Take 1 tablet (50 mg total) by mouth daily. 02/16/20  Yes Eugenie Filler, MD  methylPREDNISolone (MEDROL) 4 MG tablet Take 1 tablet (4 mg total) by mouth every other day. Alternate with the 2mg  dose Patient taking differently: Take 4 mg by mouth daily.  06/22/19  Yes Cherene Altes, MD  mometasone-formoterol (DULERA) 100-5 MCG/ACT AERO Inhale 2 puffs into the lungs 2 (two) times daily. Patient taking differently: Inhale 2 puffs into the lungs daily.  04/10/20  Yes Brand Males, MD  Nintedanib (OFEV) 100 MG CAPS Take 1 capsule (100 mg total) by mouth 2 (two) times daily. 08/23/19  Yes Brand Males, MD  pantoprazole (PROTONIX) 40 MG tablet Take 1 tablet (40 mg total) by mouth 2 (two) times daily before a meal. 05/09/19  Yes Ollis, Brandi L, NP  polyethylene glycol (MIRALAX / GLYCOLAX) 17 g packet Take 17 g by mouth 2 (two) times daily. Patient taking differently: Take 17 g by mouth daily as needed for moderate constipation.  02/15/20  Yes Eugenie Filler, MD  pravastatin (PRAVACHOL) 20 MG tablet Take 1 tablet (20 mg total) by mouth every evening. 01/18/20 01/12/21 Yes Buford Dresser, MD  senna-docusate (SENOKOT-S) 8.6-50 MG tablet Take 2 tablets by mouth 2 (two) times daily. Patient taking differently: Take 1 tablet by mouth daily as needed for mild constipation.  02/15/20  Yes Eugenie Filler, MD  sulfamethoxazole-trimethoprim (BACTRIM DS) 800-160 MG tablet Take 1 tablet by mouth 3 (three) times a week. Take on Mon, Wed, Fri Patient taking differently: Take 1 tablet by mouth every Monday, Wednesday, and Friday.  04/17/20  Yes Brand Males, MD  venlafaxine XR (EFFEXOR-XR) 75 MG 24 hr capsule Take 75 mg by mouth at bedtime.    Yes [provider]  HYDROcodone-homatropine (HYCODAN) 5-1.5 MG/5ML syrup Take 5 mLs by mouth every 6 (six) hours as needed for cough. Patient not taking: Reported on 06/12/2020 02/15/20    Eugenie Filler, MD  lactulose (CHRONULAC) 10 GM/15ML solution Take 45 mLs (30 g total) by mouth 2 (two) times daily. Patient not taking: Reported on 06/12/2020 02/15/20   Eugenie Filler, MD  melatonin 5 MG TABS Take 1 tablet (5 mg total) by mouth at bedtime. Patient not taking: Reported on 06/12/2020 02/15/20   Eugenie Filler, MD  riTUXimab (RITUXAN) 100 MG/10ML injection Inject 100 mg into the vein every 6 (six) months.     [provider]     Family History  Problem Relation Age of Onset  . Asthma Mother   . Anemia Mother   . Polymyalgia rheumatica Mother   . COPD Father   . Pulmonary fibrosis Father   . Breast cancer Maternal Grandmother 88    Social History   Socioeconomic History  . Marital status: Married    Spouse name: Not on file  . Number of children: 2  . Years of education: BA  . Highest education level: Not on file  Occupational History  . Occupation: Retired  Tobacco Use  . Smoking status: Never Smoker  . Smokeless tobacco: Never  Used  Vaping Use  . Vaping Use: Never used  Substance and Sexual Activity  . Alcohol use: No  . Drug use: No  . Sexual activity: Yes  Other Topics Concern  . Not on file  Social History Narrative   Drinks 1 cup of coffee and 1 diet coke a day    Social Determinants of Health   Financial Resource Strain:   . Difficulty of Paying Living Expenses: Not on file  Food Insecurity: No Food Insecurity  . Worried About Charity fundraiser in the Last Year: Never true  . Ran Out of Food in the Last Year: Never true  Transportation Needs: No Transportation Needs  . Lack of Transportation (Medical): No  . Lack of Transportation (Non-Medical): No  Physical Activity:   . Days of Exercise per Week: Not on file  . Minutes of Exercise per Session: Not on file  Stress:   . Feeling of Stress : Not on file  Social Connections:   . Frequency of Communication with Friends and Family: Not on file  . Frequency of Social  Gatherings with Friends and Family: Not on file  . Attends Religious Services: Not on file  . Active Member of Clubs or Organizations: Not on file  . Attends Archivist Meetings: Not on file  . Marital Status: Not on file    Review of Systems: A 12 point ROS discussed and pertinent positives are indicated in the HPI above.  All other systems are negative.  Review of Systems  Constitutional: Negative for activity change, fatigue and fever.  HENT: Negative for tinnitus.   Eyes: Positive for visual disturbance.  Respiratory: Negative for cough and shortness of breath.   Cardiovascular: Negative for chest pain.  Gastrointestinal: Negative for nausea and vomiting.  Musculoskeletal: Negative for back pain, gait problem and neck pain.  Neurological: Positive for dizziness and light-headedness. Negative for tremors, seizures, syncope, facial asymmetry, speech difficulty, weakness, numbness and headaches.  Psychiatric/Behavioral: Negative for behavioral problems and confusion.    Vital Signs: BP 137/68   Pulse 84   Temp 98 F (36.7 C) (Oral)   Resp 20   Ht 5\' 6"  (1.676 m)   Wt 140 lb (63.5 kg)   SpO2 97%   BMI 22.60 kg/m   Physical Exam Vitals reviewed.  Cardiovascular:     Rate and Rhythm: Normal rate and regular rhythm.     Heart sounds: Normal heart sounds.  Pulmonary:     Effort: Pulmonary effort is normal.     Breath sounds: Normal breath sounds.  Abdominal:     Palpations: Abdomen is soft.  Musculoskeletal:        General: Normal range of motion.  Skin:    General: Skin is warm.  Neurological:     Mental Status: She is alert and oriented to person, place, and time.  Psychiatric:        Behavior: Behavior normal.     Imaging: No results found.  Labs:  CBC: Recent Labs    02/12/20 0535 02/13/20 0552 02/14/20 0539 02/15/20 0524  WBC 16.9* 12.8* 12.1* 11.3*  HGB 11.5* 11.1* 11.7* 11.3*  HCT 37.0 35.5* 36.8 35.3*  PLT 235 206 225 250     COAGS: No results for input(s): INR, APTT in the last 8760 hours.  BMP: Recent Labs    02/12/20 0535 02/13/20 0552 02/14/20 0539 02/15/20 0524  NA 140 138 144 135  K 3.8 4.2 4.4 4.4  CL 104 100  103 98  CO2 27 30 28 30   GLUCOSE 90 95 87 85  BUN 17 13 14 10   CALCIUM 8.5* 8.7* 9.0 8.5*  CREATININE 0.57 0.51 0.56 0.51  GFRNONAA >60 >60 >60 >60  GFRAA >60 >60 >60 >60    LIVER FUNCTION TESTS: Recent Labs    07/20/19 1258 09/21/19 1107 02/09/20 1850 04/10/20 1135  BILITOT 0.3 0.4 0.6 0.4  AST 20 20 25 18   ALT 13 15 15 16   ALKPHOS 88 48 50 48  PROT 6.3 6.8 6.6 6.3  ALBUMIN 3.6 4.0 3.6 3.7    TUMOR MARKERS: No results for input(s): AFPTM, CEA, CA199, CHROMGRNA in the last 8760 hours.  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 Risks and benefits of cerebral angiogram with intervention were discussed with the patient including, but not limited to bleeding, infection, vascular injury, contrast induced renal failure, stroke or even death.  This interventional procedure involves the use of X-rays and because of the nature of the planned procedure, it is possible that we will have prolonged use of X-ray fluoroscopy.  Potential radiation risks to you include (but are not limited to) the following: - A slightly elevated risk for cancer  several years later in life. This risk is typically less than 0.5% percent. This risk is low in comparison to the normal incidence of human cancer, which is 33% for women and 50% for men according to the [place]. - Radiation induced injury can include skin redness, resembling a rash, tissue breakdown / ulcers and hair loss (which can be temporary or permanent).   The likelihood of either of these occurring depends on the difficulty of the procedure and whether you are  sensitive to radiation due to previous procedures, disease, or genetic conditions.   IF your procedure requires a prolonged use of radiation, you will be notified and given written instructions for further action.  It is your responsibility to monitor the irradiated area for the 2 weeks following the procedure and to notify your physician if you are concerned that you have suffered a radiation induced injury.    All of the patient's questions were answered, patient is agreeable to proceed.  Consent signed and in chart.   Thank you for this interesting consult.  I greatly enjoyed meeting MARCILLE BARMAN and look forward to participating in their care.  A copy of this report was sent to the requesting provider on this date.  Electronically Signed: Lavonia Drafts, PA-C 06/18/2020, 7:51 AM   I spent a total of  30 Minutes   in face to face in clinical consultation, greater than 50% of which was counseling/coordinating care for cerebral arteriogram

## 2020-06-18 NOTE — Sedation Documentation (Signed)
Pressure released at 1018, 5 fr exoseal was deployed at 1008. Right groin site, level zero.

## 2020-06-18 NOTE — Discharge Instructions (Signed)
Moderate Conscious Sedation, Adult, Care After These instructions provide you with information about caring for yourself after your procedure. Your health care provider may also give you more specific instructions. Your treatment has been planned according to current medical practices, but problems sometimes occur. Call your health care provider if you have any problems or questions after your procedure. What can I expect after the procedure? After your procedure, it is common:  To feel sleepy for several hours.  To feel clumsy and have poor balance for several hours.  To have poor judgment for several hours.  To vomit if you eat too soon. Follow these instructions at home: For at least 24 hours after the procedure:   Do not: ? Participate in activities where you could fall or become injured. ? Drive. ? Use heavy machinery. ? Drink alcohol. ? Take sleeping pills or medicines that cause drowsiness. ? Make important decisions or sign legal documents. ? Take care of children on your own.  Rest. Eating and drinking  Follow the diet recommended by your health care provider.  If you vomit: ? Drink water, juice, or soup when you can drink without vomiting. ? Make sure you have little or no nausea before eating solid foods. General instructions  Have a responsible adult stay with you until you are awake and alert.  Take over-the-counter and prescription medicines only as told by your health care provider.  If you smoke, do not smoke without supervision.  Keep all follow-up visits as told by your health care provider. This is important. Contact a health care provider if:  You keep feeling nauseous or you keep vomiting.  You feel light-headed.  You develop a rash.  You have a fever. Get help right away if:  You have trouble breathing. This information is not intended to replace advice given to you by your health care provider. Make sure you discuss any questions you have  with your health care provider. Document Revised: 06/11/2017 Document Reviewed: 10/19/2015 Elsevier Patient Education  [place] After This sheet gives you information about how to care for yourself after your procedure. Your doctor may also give you more specific instructions. If you have problems or questions, contact your doctor. Follow these instructions at home: Insertion site care  Follow instructions from your doctor about how to take care of your long, thin tube (catheter) insertion area. Make sure you: ? Wash your hands with soap and water before you change your bandage (dressing). If you cannot use soap and water, use hand sanitizer. ? Remove your bandage as told by your doctor. In 24 hours  Do not take baths, swim, or use a hot tub until your doctor says it is okay.  You may shower 24-48 hours after the procedure or as told by your doctor. ? Gently wash the area with plain soap and water. ? Pat the area dry with a clean towel. ? Do not rub the area. This may cause bleeding.  Do not apply powder or lotion to the area. Keep the area clean and dry.  Check your insertion area every day for signs of infection. Check for: ? More redness, swelling, or pain. ? Fluid or blood. ? Warmth. ? Pus or a bad smell. Activity  Rest as told by your doctor, usually for 1-2 days.  Do not lift anything that is heavier than 10 lbs. (4.5 kg) or as told by your doctor. For 5 days  Do not drive for 24 hours if  you were given a medicine to help you relax (sedative).  Do not drive or use heavy machinery while taking prescription pain medicine. General instructions   Go back to your normal activities as told by your doctor, usually in about a week. Ask your doctor what activities are safe for you.  If the insertion area starts to bleed, lie flat and put pressure on the area. If the bleeding does not stop, get help right away. This is an emergency.  Drink enough fluid  to keep your pee (urine) clear or pale yellow.  Take over-the-counter and prescription medicines only as told by your doctor.  Keep all follow-up visits as told by your doctor. This is important. Contact a doctor if:  You have a fever.  You have chills.  You have more redness, swelling, or pain around your insertion area.  You have fluid or blood coming from your insertion area.  The insertion area feels warm to the touch.  You have pus or a bad smell coming from your insertion area.  You have more bruising around the insertion area.  Blood collects in the tissue around the insertion area (hematoma) that may be painful to the touch. Get help right away if:  You have a lot of pain in the insertion area.  The insertion area swells very fast.  The insertion area is bleeding, and the bleeding does not stop after holding steady pressure on the area.  The area near or just beyond the insertion area becomes pale, cool, tingly, or numb. These symptoms may be an emergency. Do not wait to see if the symptoms will go away. Get medical help right away. Call your local emergency services (911 in the [place] Do not drive yourself to the hospital. Summary  After the procedure, it is common to have bruising and tenderness at the long, thin tube insertion area.  After the procedure, it is important to rest and drink plenty of fluids.  Do not take baths, swim, or use a hot tub until your doctor says it is okay to do so. You may shower 24-48 hours after the procedure or as told by your doctor.  If the insertion area starts to bleed, lie flat and put pressure on the area. If the bleeding does not stop, get help right away. This is an emergency. This information is not intended to replace advice given to you by your health care provider. Make sure you discuss any questions you have with your health care provider. Document Revised: 06/11/2017 Document Reviewed: 06/23/2016 Elsevier Patient  Education  2020 [employer].

## 2020-06-21 ENCOUNTER — Emergency Department (HOSPITAL_BASED_OUTPATIENT_CLINIC_OR_DEPARTMENT_OTHER): Payer: Medicare Other

## 2020-06-21 ENCOUNTER — Other Ambulatory Visit: Payer: Self-pay

## 2020-06-21 ENCOUNTER — Encounter (HOSPITAL_BASED_OUTPATIENT_CLINIC_OR_DEPARTMENT_OTHER): Payer: Self-pay | Admitting: Emergency Medicine

## 2020-06-21 ENCOUNTER — Emergency Department (HOSPITAL_BASED_OUTPATIENT_CLINIC_OR_DEPARTMENT_OTHER)
Admission: EM | Admit: 2020-06-21 | Discharge: 2020-06-21 | Disposition: A | Discharge status: Home / Self Care | Payer: Medicare Other | Attending: Emergency Medicine | Admitting: Emergency Medicine

## 2020-06-21 DIAGNOSIS — R55 Syncope and collapse: Secondary | ICD-10-CM | POA: Diagnosis not present

## 2020-06-21 DIAGNOSIS — Z8616 Personal history of COVID-19: Secondary | ICD-10-CM | POA: Insufficient documentation

## 2020-06-21 DIAGNOSIS — J45901 Unspecified asthma with (acute) exacerbation: Secondary | ICD-10-CM | POA: Insufficient documentation

## 2020-06-21 DIAGNOSIS — Z79899 Other long term (current) drug therapy: Secondary | ICD-10-CM | POA: Diagnosis not present

## 2020-06-21 DIAGNOSIS — J90 Pleural effusion, not elsewhere classified: Secondary | ICD-10-CM | POA: Diagnosis not present

## 2020-06-21 DIAGNOSIS — Z7951 Long term (current) use of inhaled steroids: Secondary | ICD-10-CM | POA: Diagnosis not present

## 2020-06-21 DIAGNOSIS — R42 Dizziness and giddiness: Secondary | ICD-10-CM

## 2020-06-21 DIAGNOSIS — I10 Essential (primary) hypertension: Secondary | ICD-10-CM | POA: Diagnosis not present

## 2020-06-21 DIAGNOSIS — R251 Tremor, unspecified: Secondary | ICD-10-CM | POA: Insufficient documentation

## 2020-06-21 LAB — COMPREHENSIVE METABOLIC PANEL
ALT: 19 U/L (ref 0–44)
AST: 27 U/L (ref 15–41)
Albumin: 3.7 g/dL (ref 3.5–5.0)
Alkaline Phosphatase: 47 U/L (ref 38–126)
Anion gap: 11 (ref 5–15)
BUN: 10 mg/dL (ref 8–23)
CO2: 25 mmol/L (ref 22–32)
Calcium: 8.9 mg/dL (ref 8.9–10.3)
Chloride: 99 mmol/L (ref 98–111)
Creatinine, Ser: 0.68 mg/dL (ref 0.44–1.00)
GFR, Estimated: >60 mL/min (ref >60)
Glucose, Bld: 100 mg/dL — ABNORMAL HIGH (ref 70–99)
Potassium: 4.2 mmol/L (ref 3.5–5.1)
Sodium: 135 mmol/L (ref 135–145)
Total Bilirubin: 0.4 mg/dL (ref 0.3–1.2)
Total Protein: 6.9 g/dL (ref 6.5–8.1)

## 2020-06-21 LAB — URINALYSIS, ROUTINE W REFLEX MICROSCOPIC
Bilirubin Urine: NEGATIVE
Glucose, UA: NEGATIVE mg/dL
Hgb urine dipstick: NEGATIVE
Ketones, ur: NEGATIVE mg/dL
Nitrite: NEGATIVE
Protein, ur: NEGATIVE mg/dL
Specific Gravity, Urine: <1.005 — ABNORMAL LOW (ref 1.005–1.030)
pH: 7 (ref 5.0–8.0)

## 2020-06-21 LAB — CBC WITH DIFFERENTIAL/PLATELET
Abs Immature Granulocytes: 0.03 10*3/uL (ref 0.00–0.07)
Basophils Absolute: 0 10*3/uL (ref 0.0–0.1)
Basophils Relative: 1 %
Eosinophils Absolute: 0.3 10*3/uL (ref 0.0–0.5)
Eosinophils Relative: 4 %
HCT: 39.8 % (ref 36.0–46.0)
Hemoglobin: 13.1 g/dL (ref 12.0–15.0)
Immature Granulocytes: 0 %
Lymphocytes Relative: 26 %
Lymphs Abs: 2 10*3/uL (ref 0.7–4.0)
MCH: 30.6 pg (ref 26.0–34.0)
MCHC: 32.9 g/dL (ref 30.0–36.0)
MCV: 93 fL (ref 80.0–100.0)
Monocytes Absolute: 0.7 10*3/uL (ref 0.1–1.0)
Monocytes Relative: 9 %
Neutro Abs: 4.7 10*3/uL (ref 1.7–7.7)
Neutrophils Relative %: 60 %
Platelets: 250 10*3/uL (ref 150–400)
RBC: 4.28 MIL/uL (ref 3.87–5.11)
RDW: 13.7 % (ref 11.5–15.5)
WBC: 7.7 10*3/uL (ref 4.0–10.5)
nRBC: 0 % (ref 0.0–0.2)

## 2020-06-21 LAB — URINALYSIS, MICROSCOPIC (REFLEX): RBC / HPF: NONE SEEN RBC/hpf (ref 0–5)

## 2020-06-21 LAB — TROPONIN I (HIGH SENSITIVITY): Troponin I (High Sensitivity): 8 ng/L (ref <18)

## 2020-06-21 MED ORDER — IOHEXOL 350 MG/ML SOLN
100.0000 mL | Freq: Once | INTRAVENOUS | Status: AC | PRN
  Administered 2020-06-21: 100 mL via INTRAVENOUS

## 2020-06-21 MED ORDER — SODIUM CHLORIDE 0.9 % IV BOLUS
1000.0000 mL | Freq: Once | INTRAVENOUS | Status: AC
  Administered 2020-06-21: 1000 mL via INTRAVENOUS

## 2020-06-21 NOTE — ED Provider Notes (Signed)
[facility]ERGENCY DEPARTMENT Provider Note   CSN: [id number] Arrival date & time: 06/21/20  1617     History Chief Complaint  Patient presents with  . Hypertension    Stacey [name] is a 79 y.o. female.  Presents ER with myriad complaints.  Patient reports she had an angiogram on Tuesday due to longstanding episodes of intermittent dizziness.  At time of procedure she denies any symptoms.  Reports that she is unaware of any complications from procedure and felt that she was in her normal state of health after this.  That same day, she started having episodes of shaking in her arms.  Felt like she was having chills.  That evening she had a syncopal episode after walking from her room to the bathroom.  This was preceded by sensation of lightheadedness.  Went down slowly with assistance from husband.  Denied trauma.  EMS came out to evaluate her but they ultimately decided to stay home.  Wednesday she felt very fatigued, slept most the day.  Yesterday feeling better, able to get up and move around more.  Today however felt somewhat nauseated and had an episode of vomiting.  Felt lightheaded, no dizziness or room spinning sensation.  Currently does not have any symptoms.  She has had no chest pain or shortness of breath.  HPI     Past Medical History:  Diagnosis Date  . Abnormal finding of blood chemistry   . Asthma   . H/O measles   . H/O varicella   . Hypertension   . 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[place])   . 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 Lichen sclerosus 58/83/25   Asymptomatic  . Low iron   . Mitral valve prolapse   . Osteoarthritis   . Osteoporosis   . Pneumonia   . Post herpetic neuralgia   . Rheumatoid arthritis(714.0)   . Yeast infection     Patient Active Problem List   Diagnosis Date Noted  . Chronic cough 06/12/2020  . Constipation   . CAP (community acquired pneumonia) 02/09/2020  . Acute on chronic respiratory failure with hypoxia ([place]) 07/06/2019   . Fall at home, initial encounter 07/06/2019  . Orthostatic hypotension 07/06/2019  . Rib fractures 07/04/2019  . Pneumonia due to COVID-19 virus 06/11/2019  . 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[place]) 05/07/2019  . Acute asthma exacerbation 12/18/2018  . Elevated troponin 12/18/2018  . Acute bronchitis 08/30/2018  . Chronic respiratory failure with hypoxia ([place]) 04/06/2018  . Essential hypertension 03/24/2018  . Depression 03/24/2018  . Asthma exacerbation 03/23/2018  . DOE (dyspnea on exertion) 03/21/2018  . 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 esophagitis 03/21/2018  . Chronic lung disease   . Hypoxia   . Dyspnea 01/23/2018  . ILD (interstitial lung disease) ([place]) 01/23/2018  . 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 Multiple pulmonary nodules/RA lung dz  05/29/2013  . Cough variant asthma vs uacs  05/29/2013  . Rheumatoid arthritis ([place]) 01/13/2012  . Osteopenia 01/13/2012  . Lichen sclerosus et atrophicus of the vulva 01/13/2012  . Asthma 01/13/2012    Past Surgical History:  Procedure Laterality Date  . CHOLECYSTECTOMY  2011  . IR ANGIO INTRA EXTRACRAN SEL COM CAROTID INNOMINATE BILAT MOD SED  06/18/2020  . IR ANGIO VERTEBRAL SEL SUBCLAVIAN INNOMINATE UNI R MOD SED  06/18/2020  . IR ANGIO VERTEBRAL SEL VERTEBRAL UNI L MOD SED  06/18/2020  . WISDOM TOOTH EXTRACTION       OB History  Gravida  2   Para  2   Term  2   Preterm      AB      Living  2     SAB      IAB      Ectopic      Multiple      Live Births  2           Family History  Problem Relation Age of Onset  . Asthma Mother   . Anemia Mother   . Polymyalgia rheumatica Mother   . COPD Father   . Pulmonary fibrosis Father   . Breast cancer Maternal Grandmother 88    Social History   Tobacco Use  . Smoking status: Never Smoker  . Smokeless tobacco: Never Used  Vaping Use  . Vaping Use: Never used  Substance Use Topics  . Alcohol use: No  . Drug use: No    Home  Medications Prior to Admission medications   Medication Sig Start Date End Date Taking? Authorizing Provider  albuterol (VENTOLIN HFA) 108 (90 Base) MCG/ACT inhaler Inhale 2 puffs into the lungs every 6 (six) hours as needed for wheezing or shortness of breath.    [provider]  amitriptyline (ELAVIL) 25 MG tablet Take 25 mg by mouth at bedtime. 02/04/15   [provider]  benzonatate (TESSALON) 200 MG capsule Take 1 capsule (200 mg total) by mouth 3 (three) times daily as needed for cough. 06/12/20   Lauraine Rinne, NP  beta carotene w/minerals (OCUVITE) tablet Take 1 tablet by mouth daily.    [provider]  Biotin 1000 MCG tablet Take 1,000 mcg by mouth daily.    [provider]  cholecalciferol (VITAMIN D) 1000 UNITS tablet Take 1,000 Units by mouth daily.    [provider]  Estradiol 10 MCG TABS vaginal tablet Place 10 mcg vaginally daily as needed (dryness).     [provider]  gabapentin (NEURONTIN) 300 MG capsule Take 600 mg by mouth at bedtime.    [provider]  HYDROcodone-homatropine (HYCODAN) 5-1.5 MG/5ML syrup Take 5 mLs by mouth every 6 (six) hours as needed for cough. Patient not taking: Reported on 06/12/2020 02/15/20   Eugenie Filler, MD  ipratropium-albuterol (DUONEB) 0.5-2.5 (3) MG/3ML SOLN Take 3 mLs by nebulization 3 (three) times daily for 5 days. Patient taking differently: Take 3 mLs by nebulization every 4 (four) hours as needed (shortness of breath).  02/15/20 06/12/20  Eugenie Filler, MD  lactulose (CHRONULAC) 10 GM/15ML solution Take 45 mLs (30 g total) by mouth 2 (two) times daily. Patient not taking: Reported on 06/12/2020 02/15/20   Eugenie Filler, MD  losartan (COZAAR) 50 MG tablet Take 1 tablet (50 mg total) by mouth daily. 02/16/20   Eugenie Filler, MD  melatonin 5 MG TABS Take 1 tablet (5 mg total) by mouth at bedtime. Patient not taking: Reported on 06/12/2020 02/15/20   Eugenie Filler, MD   methylPREDNISolone (MEDROL) 4 MG tablet Take 1 tablet (4 mg total) by mouth every other day. Alternate with the 62m dose Patient taking differently: Take 4 mg by mouth daily.  06/22/19   MCherene Altes MD  mometasone-formoterol (DULERA) 100-5 MCG/ACT AERO Inhale 2 puffs into the lungs 2 (two) times daily. Patient taking differently: Inhale 2 puffs into the lungs daily.  04/10/20   RBrand Males MD  Nintedanib (OFEV) 100 MG CAPS Take 1 capsule (100 mg total) by mouth 2 (  two) times daily. 08/23/19   Brand Males, MD  pantoprazole (PROTONIX) 40 MG tablet Take 1 tablet (40 mg total) by mouth 2 (two) times daily before a meal. 05/09/19   Ollis, Brandi L, NP  polyethylene glycol (MIRALAX / GLYCOLAX) 17 g packet Take 17 g by mouth 2 (two) times daily. Patient taking differently: Take 17 g by mouth daily as needed for moderate constipation.  02/15/20   Eugenie Filler, MD  pravastatin (PRAVACHOL) 20 MG tablet Take 1 tablet (20 mg total) by mouth every evening. 01/18/20 01/12/21  Buford Dresser, MD  riTUXimab (RITUXAN) 100 MG/10ML injection Inject 100 mg into the vein every 6 (six) months.     [provider]  senna-docusate (SENOKOT-S) 8.6-50 MG tablet Take 2 tablets by mouth 2 (two) times daily. Patient taking differently: Take 1 tablet by mouth daily as needed for mild constipation.  02/15/20   Eugenie Filler, MD  sulfamethoxazole-trimethoprim (BACTRIM DS) 800-160 MG tablet Take 1 tablet by mouth 3 (three) times a week. Take on Mon, Wed, Fri Patient taking differently: Take 1 tablet by mouth every Monday, Wednesday, and Friday.  04/17/20   Brand Males, MD  venlafaxine XR (EFFEXOR-XR) 75 MG 24 hr capsule Take 75 mg by mouth at bedtime.     [provider]    Allergies    Penicillins, Remicade [infliximab], and Sulfa antibiotics  Review of Systems   Review of Systems  Constitutional: Positive for chills and fatigue. Negative for fever.  HENT: Negative for  ear pain and sore throat.   Eyes: Negative for pain and visual disturbance.  Respiratory: Negative for cough and shortness of breath.   Cardiovascular: Negative for chest pain and palpitations.  Gastrointestinal: Positive for nausea and vomiting. Negative for abdominal pain.  Genitourinary: Negative for dysuria and hematuria.  Musculoskeletal: Negative for arthralgias and back pain.  Skin: Negative for color change and rash.  Neurological: Positive for tremors. Negative for seizures and syncope.  All other systems reviewed and are negative.   Physical Exam Updated Vital Signs BP (!) 152/82 (BP Location: Right Arm)   Pulse 76   Temp 98.2 F (36.8 C) (Oral)   Resp 14   Ht '5\' 6"'  (1.676 m)   Wt 62.6 kg   SpO2 99%   BMI 22.27 kg/m   Physical Exam Vitals and nursing note reviewed.  Constitutional:      General: She is not in acute distress.    Appearance: She is well-developed and well-nourished.  HENT:     Head: Normocephalic and atraumatic.  Eyes:     Conjunctiva/sclera: Conjunctivae normal.  Cardiovascular:     Rate and Rhythm: Normal rate and regular rhythm.     Heart sounds: No murmur heard.   Pulmonary:     Effort: Pulmonary effort is normal. No respiratory distress.     Breath sounds: Normal breath sounds.  Abdominal:     Palpations: Abdomen is soft.     Tenderness: There is no abdominal tenderness.  Musculoskeletal:        General: No edema.     Cervical back: Neck supple.  Skin:    General: Skin is warm and dry.  Neurological:     Mental Status: She is alert.     Comments: AAOx3 CN 2-12 intact, speech clear visual fields intact 5/5 strength in b/l UE and LE Sensation to light touch intact in b/l UE and LE Normal FNF  Psychiatric:        Mood and  Affect: Mood and affect normal.     ED Results / Procedures / Treatments   Labs (all labs ordered are listed, but only abnormal results are displayed) Labs Reviewed  COMPREHENSIVE METABOLIC PANEL -  Abnormal; Notable for the following components:      Result Value   Glucose, Bld 100 (*)    All other components within normal limits  URINALYSIS, ROUTINE W REFLEX MICROSCOPIC - Abnormal; Notable for the following components:   Specific Gravity, Urine <1.005 (*)    Leukocytes,Ua TRACE (*)    All other components within normal limits  URINALYSIS, MICROSCOPIC (REFLEX) - Abnormal; Notable for the following components:   Bacteria, UA RARE (*)    All other components within normal limits  CBC WITH DIFFERENTIAL/PLATELET  TROPONIN I (HIGH SENSITIVITY)  TROPONIN I (HIGH SENSITIVITY)    EKG EKG Interpretation  Date/Time:  Friday June 21 2020 16:32:44 EST Ventricular Rate:  85 PR Interval:  152 QRS Duration: 82 QT Interval:  360 QTC Calculation: 428 R Axis:   47 Text Interpretation: Normal sinus rhythm Normal ECG Confirmed by Madalyn Rob [phone]) on 06/21/2020 4:46:29 PM   Radiology CT Angio Head W or Wo Contrast  Result Date: 06/21/2020 CLINICAL DATA:  79 year old female with vertigo, syncope. Status post cerebral angiogram 3 days ago for dizziness and blurred vision. EXAM: CT ANGIOGRAPHY HEAD AND NECK TECHNIQUE: Multidetector CT imaging of the head and neck was performed using the standard protocol during bolus administration of intravenous contrast. Multiplanar CT image reconstructions and MIPs were obtained to evaluate the vascular anatomy. Carotid stenosis measurements (when applicable) are obtained utilizing NASCET criteria, using the distal internal carotid diameter as the denominator. CONTRAST:  126m OMNIPAQUE IOHEXOL 350 MG/ML SOLN COMPARISON:  Cerebral angiogram 06/18/2020. CTA head and neck 03/19/2020. Head CT 03/19/2020.  Brain MRI 08/27/2015. FINDINGS: CT HEAD Brain: Stable cerebral volume. No midline shift, ventriculomegaly, mass effect, evidence of mass lesion, intracranial hemorrhage or evidence of cortically based acute infarction. Mild patchy hypodensity in the pons  corresponds to chronic signal abnormality on the 2017 MRI. Stable and largely negative for age gray-white matter differentiation elsewhere. Calvarium and skull base: No acute osseous abnormality identified. Paranasal sinuses: Visualized paranasal sinuses and mastoids are clear. Orbits: Stable orbit and scalp soft tissues. CTA NECK Skeleton: Cervical spine and right TMJ degeneration. No acute osseous abnormality identified. Upper chest: Moderate to severe chronic upper lobe lung disease with confluent apical and scattered subcortical areas of lung scarring. No superior mediastinal lymphadenopathy. Other neck: No acute finding. Aortic arch: Aberrant origin right subclavian artery again noted. Extensive calcified arch atherosclerosis as before. Right carotid system: Tortuous proximal right CCA. Mild plaque proximal to the bifurcation without stenosis. Moderate calcified plaque at the right ICA origin but less than 50 % stenosis with respect to the distal vessel. Left carotid system: Shared origin with the right CCA. Intermittent mild to moderate left CCA plaque without stenosis. Mild to moderate calcified plaque at the left ICA origin without stenosis. Tortuous left ICA distal to the bulb. Vertebral arteries: Stable plaque at the aberrant origin of the right subclavian artery which does not appear hemodynamically significant. Mild venous artifact around the right vertebral artery origin which appears normal. Mildly non dominant right vertebral artery with stable tortuosity to the skull base, no stenosis. Stable plaque in the proximal left subclavian artery with 50% stenosis demonstrated there recently by angiogram. Mild to moderate stenosis at the left vertebral artery origin (series 10, image 510) is stable since September. Left  vertebral is mildly dominant and patent to the skull base with tortuosity but no additional stenosis. CTA HEAD Posterior circulation: Stable distal vertebral arteries, the right functionally  terminates in PICA and the left supplies the basilar. No distal vertebral stenosis. Patent but diminutive basilar artery which functionally terminates in the SCA is. Fetal type bilateral PCA origins. Bilateral PCA branches are stable and within normal limits. Anterior circulation: Both ICA siphons remain patent. Mild siphon plaque and tortuosity without stenosis. Normal bilateral ophthalmic and posterior communicating artery origins. Patent carotid termini. Normal MCA and ACA origins. Diminutive or absent anterior communicating artery. Bilateral ACA branches are patent. There is mild to moderate stenosis of the right ACA A2 on series 514, image 23 which has not significantly changed since September. Left MCA M1 segment is mildly irregular without stenosis. Left MCA bifurcation and left MCA branches are stable without stenosis. Right MCA M1 segment and bifurcation are patent without stenosis. Right MCA branches are stable and within normal limits. Venous sinuses: Patent. Anatomic variants: Aberrant origin of the right subclavian artery. Dominant left vertebral artery supplies the basilar, the right functionally terminates in PICA. Fetal type bilateral PCA origins. Review of the MIP images confirms the above findings IMPRESSION: 1. Negative for large vessel occlusion. 2. Stable CTA Head and Neck since September. Widespread extracranial atherosclerosis, mild for age intracranial atherosclerosis, and no high-grade arterial stenosis - concordant with the recent conventional cerebral angiogram findings. 3. Stable CT appearance of the brain. No acute intracranial abnormality. 4. Aortic Atherosclerosis (ICD10-I70.0) and Emphysema (ICD10-J43.9). Aberrant origin of the right subclavian artery. Electronically Signed   By: Genevie Ann M.D.   On: 06/21/2020 18:41   DG Chest 2 View  Result Date: 06/21/2020 CLINICAL DATA:  Dizziness. EXAM: CHEST - 2 VIEW COMPARISON:  February 12, 2020. FINDINGS: The heart size and mediastinal  contours are within normal limits. No pneumothorax or pleural effusion is noted. Stable interstitial densities are noted throughout both lungs most consistent with scarring or chronic interstitial lung disease. The visualized skeletal structures are unremarkable. IMPRESSION: Stable bilateral interstitial densities are noted most consistent with scarring or chronic interstitial lung disease. Aortic Atherosclerosis (ICD10-I70.0). Electronically Signed   By: Marijo Conception M.D.   On: 06/21/2020 18:30   CT Angio Neck W and/or Wo Contrast  Result Date: 06/21/2020 CLINICAL DATA:  79 year old female with vertigo, syncope. Status post cerebral angiogram 3 days ago for dizziness and blurred vision. EXAM: CT ANGIOGRAPHY HEAD AND NECK TECHNIQUE: Multidetector CT imaging of the head and neck was performed using the standard protocol during bolus administration of intravenous contrast. Multiplanar CT image reconstructions and MIPs were obtained to evaluate the vascular anatomy. Carotid stenosis measurements (when applicable) are obtained utilizing NASCET criteria, using the distal internal carotid diameter as the denominator. CONTRAST:  168m OMNIPAQUE IOHEXOL 350 MG/ML SOLN COMPARISON:  Cerebral angiogram 06/18/2020. CTA head and neck 03/19/2020. Head CT 03/19/2020.  Brain MRI 08/27/2015. FINDINGS: CT HEAD Brain: Stable cerebral volume. No midline shift, ventriculomegaly, mass effect, evidence of mass lesion, intracranial hemorrhage or evidence of cortically based acute infarction. Mild patchy hypodensity in the pons corresponds to chronic signal abnormality on the 2017 MRI. Stable and largely negative for age gray-white matter differentiation elsewhere. Calvarium and skull base: No acute osseous abnormality identified. Paranasal sinuses: Visualized paranasal sinuses and mastoids are clear. Orbits: Stable orbit and scalp soft tissues. CTA NECK Skeleton: Cervical spine and right TMJ degeneration. No acute osseous  abnormality identified. Upper chest: Moderate to severe chronic upper  lobe lung disease with confluent apical and scattered subcortical areas of lung scarring. No superior mediastinal lymphadenopathy. Other neck: No acute finding. Aortic arch: Aberrant origin right subclavian artery again noted. Extensive calcified arch atherosclerosis as before. Right carotid system: Tortuous proximal right CCA. Mild plaque proximal to the bifurcation without stenosis. Moderate calcified plaque at the right ICA origin but less than 50 % stenosis with respect to the distal vessel. Left carotid system: Shared origin with the right CCA. Intermittent mild to moderate left CCA plaque without stenosis. Mild to moderate calcified plaque at the left ICA origin without stenosis. Tortuous left ICA distal to the bulb. Vertebral arteries: Stable plaque at the aberrant origin of the right subclavian artery which does not appear hemodynamically significant. Mild venous artifact around the right vertebral artery origin which appears normal. Mildly non dominant right vertebral artery with stable tortuosity to the skull base, no stenosis. Stable plaque in the proximal left subclavian artery with 50% stenosis demonstrated there recently by angiogram. Mild to moderate stenosis at the left vertebral artery origin (series 10, image 510) is stable since September. Left vertebral is mildly dominant and patent to the skull base with tortuosity but no additional stenosis. CTA HEAD Posterior circulation: Stable distal vertebral arteries, the right functionally terminates in PICA and the left supplies the basilar. No distal vertebral stenosis. Patent but diminutive basilar artery which functionally terminates in the SCA is. Fetal type bilateral PCA origins. Bilateral PCA branches are stable and within normal limits. Anterior circulation: Both ICA siphons remain patent. Mild siphon plaque and tortuosity without stenosis. Normal bilateral ophthalmic and  posterior communicating artery origins. Patent carotid termini. Normal MCA and ACA origins. Diminutive or absent anterior communicating artery. Bilateral ACA branches are patent. There is mild to moderate stenosis of the right ACA A2 on series 514, image 23 which has not significantly changed since September. Left MCA M1 segment is mildly irregular without stenosis. Left MCA bifurcation and left MCA branches are stable without stenosis. Right MCA M1 segment and bifurcation are patent without stenosis. Right MCA branches are stable and within normal limits. Venous sinuses: Patent. Anatomic variants: Aberrant origin of the right subclavian artery. Dominant left vertebral artery supplies the basilar, the right functionally terminates in PICA. Fetal type bilateral PCA origins. Review of the MIP images confirms the above findings IMPRESSION: 1. Negative for large vessel occlusion. 2. Stable CTA Head and Neck since September. Widespread extracranial atherosclerosis, mild for age intracranial atherosclerosis, and no high-grade arterial stenosis - concordant with the recent conventional cerebral angiogram findings. 3. Stable CT appearance of the brain. No acute intracranial abnormality. 4. Aortic Atherosclerosis (ICD10-I70.0) and Emphysema (ICD10-J43.9). Aberrant origin of the right subclavian artery. Electronically Signed   By: Genevie Ann M.D.   On: 06/21/2020 18:41    Procedures Procedures (including critical care time)  Medications Ordered in ED Medications  sodium chloride 0.9 % bolus 1,000 mL (0 mLs Intravenous Stopped 06/21/20 1857)  iohexol (OMNIPAQUE) 350 MG/ML injection 100 mL (100 mLs Intravenous Contrast Given 06/21/20 1756)    ED Course  I have reviewed the triage vital signs and the nursing notes.  Pertinent labs & imaging results that were available during my care of the patient were reviewed by me and considered in my medical decision making (see chart for details).    MDM  Rules/Calculators/A&P                         79 year old lady history of  rheumatoid arthritis, interstitial lung disease, asthma presents to ER with concern for myriad symptoms including episodes of shaking, episode of syncope, lightheadedness, fatigue, occasional nausea.  On exam currently patient is well-appearing in no distress with stable vital signs.  She actually does not have any current symptoms.  CTA head and neck today negative for any acute abnormalities, no evidence for stroke, dissection, complication of a recent procedure.  EKG without acute ischemic change, no events on telemetry monitoring.  Troponin within normal limits.  Remainder of labs were also within normal limits.  UA negative for infection.  Etiology for patient's symptoms not clear at this time.  However, given this reassuring work-up, reassuring exam, no ongoing symptoms, believe patient can be discharged and managed in the outpatient setting.  Recommended close follow-up with primary doctor, return if symptoms worsen.  After the discussed management above, the patient was determined to be safe for discharge.  The patient was in agreement with this plan and all questions regarding their care were answered.  ED return precautions were discussed and the patient will return to the ED with any significant worsening of condition.  Final Clinical Impression(s) / ED Diagnoses Final diagnoses:  Episode of shaking  Syncope, unspecified syncope type  Lightheadedness    Rx / DC Orders ED Discharge Orders    None       Lucrezia Starch, MD 06/21/20 1912

## 2020-06-21 NOTE — ED Triage Notes (Signed)
Reports having cerebral angiogram on Tuesday.  Reports she felt fine when she first went home but around 5-6 pm that day started having shaking, and feeling lightheaded and had a syncopal episode.  Was seen by ems and told that there was a long wait at [facility] so they opted to stay home.  Reports she felt better yesterday.  Today noticed BP is up and down and has had N/V X 1 yesterday.  Was told she is probably dehydrated but never went to the ED.

## 2020-06-21 NOTE — Discharge Instructions (Addendum)
Please follow-up with primary doctor regarding your symptoms from today.  Return to the emergency room with any episodes of passing out, chest pain, shortness of breath, vomiting or other new concerning symptom.

## 2020-06-21 NOTE — ED Notes (Signed)
Patient denies pain and is resting comfortably.  

## 2020-06-24 ENCOUNTER — Telehealth: Payer: Self-pay | Admitting: Internal Medicine

## 2020-06-24 NOTE — Telephone Encounter (Signed)
noted 

## 2020-06-24 NOTE — Telephone Encounter (Signed)
Submitted an urgent Prior Authorization request to [facility] for OFEV 150mg  via Cover My Meds. Will update once we receive a response.   (Key: [id number])

## 2020-06-24 NOTE — Telephone Encounter (Signed)
Received notification from [facility] regarding a prior authorization for Winf[facility]. Authorization has been APPROVED from 06/24/20 to 07/12/21.   Authorization # [id number] and advised.

## 2020-06-26 ENCOUNTER — Ambulatory Visit (INDEPENDENT_AMBULATORY_CARE_PROVIDER_SITE_OTHER): Payer: Medicare Other | Admitting: Neurology

## 2020-06-26 ENCOUNTER — Encounter: Payer: Self-pay | Admitting: Neurology

## 2020-06-26 VITALS — BP 145/74 | HR 76 | Ht 66.0 in | Wt 138.0 lb

## 2020-06-26 DIAGNOSIS — R2689 Other abnormalities of gait and mobility: Secondary | ICD-10-CM | POA: Diagnosis not present

## 2020-06-26 DIAGNOSIS — R269 Unspecified abnormalities of gait and mobility: Secondary | ICD-10-CM | POA: Diagnosis not present

## 2020-06-26 DIAGNOSIS — I2510 Atherosclerotic heart disease of native coronary artery without angina pectoris: Secondary | ICD-10-CM | POA: Diagnosis not present

## 2020-06-26 NOTE — Patient Instructions (Signed)
Recently you had to go to the emergency room after your angiogram.  Thankfully, the angiogram results were without any significant abnormality noted. I am glad to hear that you are feeling better.  I think, at this point, you can follow-up with your primary care and other specialists especially pulmonologist for your interstitial lung disease.  Please try to continue to stay well-hydrated, try to stand up slowly, get your bearings first, try not to suddenly turn or bend down abruptly to pick something up.  Please try not to carry anything in your hands when you go up or go down stairs.

## 2020-06-26 NOTE — Progress Notes (Signed)
Subjective:    Patient ID: Stacey [name] is a 79 y.o. female.  HPI     Interim history:   Stacey [name] is an 79 year old right-handed woman with an underlying complex medical history of hyperlipidemia, allergies, postherpetic neuralgia, osteoporosis, interstitial lung disease, history of Covid pneumonia in December 2020, fall with history of rib fractures in December 2020, rheumatoid arthritis, and hospitalization in August 2021 for acute on chronic respiratory failure with hypoxia, who Presents for follow-up consultation of her balance problems.  I saw her on 04/02/2020 at the request of her primary care physician, at which time she reported a 2-year history of progressive balance issues.  She had a recent CT angiogram of the head which showed ventriculomegaly.  Clinical history and findings on scan were not telltale for normal pressure hydrocephalus.  We talked about NPH and work-up and treatment.  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  I made a referral to interventional radiology as well as physical therapy.  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   Approximately 30% stenosis of the left vertebral artery origin.   Mild FMD-like changes involving the distal left vertebral artery at the C1 level.   She had an interim ER visit on 06/21/2020 for lightheadedness and presyncopal symptoms.  She had a CT angiogram of the head and neck with and without contrast on 06/21/2020 and I reviewed the results:   IMPRESSION: 1. Negative for large vessel occlusion.   2. Stable CTA Head and Neck since September. Widespread extracranial atherosclerosis, mild for age intracranial atherosclerosis, and no high-grade arterial stenosis - concordant with the recent conventional cerebral angiogram findings.   3. Stable CT appearance of  the brain. No acute intracranial abnormality.   4. Aortic Atherosclerosis (ICD10-I70.0) and Emphysema (ICD10-J43.9). Aberrant origin of the right subclavian artery.  Today, 06/26/2020: She reports no significant change in her balance symptoms.  She tries to be cautious.  She tries to hydrate better and be mindful of risk for dehydration.  She has not fallen.  She did feel quite bad after her angiogram and reports that she was likely dehydrated when she had to go to the hospital.  She reports that she was not supposed to have anything after midnight before her procedure and had stopped eating or drinking around 10 PM that night before and she went in for the angiogram around 7 AM and did not get home until 3 PM.  Overall, she feels improved compared to when she had to go to the ER on 06/21/2020.  Previously:   04/02/20: (She) reports an approximately 2-year history of worsening balance.  She has had falls.  Of note, she most recently fell about 6 weeks ago, she fell over a table and off the lamp off the table.  Thankfully she did not injure herself.  She denies any lightheadedness upon standing but sometimes it feels like she has spinning sensation.  Denies any nausea or vomiting.  She just feels off balance and dizzy.  She has no family history of Parkinson's disease or tremors.  She has not noticed a tremor but is noted to have a head and voice tremor today on exam.  She has an oxygen concentrator and is supposed to use oxygen as needed.  She did not bring any oxygen today but but did have some noisy breathing and  wheezing on examination.  She denies any presyncopal symptoms.  She denies any recent chest pain or shortness of breath.  She is followed by cardiology and pulmonology.  She is also followed by rheumatology.  Of note, she is on multiple medications.  She is also on potentially sedating medications including gabapentin 600 mg at bedtime, amitriptyline 25 mg at bedtime and she takes Effexor  long-acting 75 mg at bedtime.    I have previously evaluated her for paresthesias. I reviewed your office note from 03/14/2020. She had a recent CT angiogram head and neck with and without contrast on 03/19/2020 and I reviewed the results: IMPRESSION: 1. 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 2. Moderate narrowing of a proximal left M2 MCA branch. 3. Otherwise, no significant focal stenosis or occlusion in the neck or head. Diminutive basilar artery and hypoplastic or absent P1 PCAs is favored to reflect anatomic variation given prominent bilateral fetal type posterior communicating arteries. 4. Diffuse ventriculomegaly (similar when compared to 2017), which is slightly out of proportion to the degree of cerebral volume loss. While this could be ex vacuo in etiology, this finding can be seen with normal pressure hydrocephalus in the correct clinical setting. No periventricular edema. 5. Multilevel degenerative changes of the cervical spine, greatest at C5-C6 and C6-C7 where there is at least moderate canal stenosis. MRI of the cervical spine could better characterize the canal/cord, if clinically indicated. 6. Areas of ground-glass opacity with peripheral reticulation in the imaged upper lungs, better characterized on recent CT chest from 03/04/2020.      09/10/15: Stacey [name] is a 79 year old right-handed woman with an underlying medical history of pulmonary nodules and cough, low iron, rheumatoid arthritis, osteoporosis, leg edema, mitral valve prolapse, hyperlipidemia, history of varicella and measles, who presents for follow-up consultation of her left arm numbness and tingling. The patient is unaccompanied today. I first met her on 08/20/2015 at the request of her primary care physician, at which time she reported a 3-4 week history of intermittent left arm numbness and  tingling. Her exam was fairly benign. I suggested a cervical spine and brain MRI, EMG and nerve conduction testing and blood work. Blood work was benign. She had a normal CK level, hemoglobin A1c was in the prediabetes range. We called her with her test results.    She had a brain MRI without contrast on 08/27/2015:  IMPRESSION:  Abnormal MRI scan of the brain showing mild degree of supratentorial cortical atrophy and diffuse ventriculomegaly which is slightly out of proportion. No acute abnormalities noted.   She had a cervical spine MRI without contrast on 08/28/2015:IMPRESSION:  Abnormal MRI scan of the cervical spine showing prominent multilevel spondylytic changes most prominent at C6-7 where there is a large disc osteophyte complex eccentrically to the left with displacement, flattening of the cord with canal stenosis and moderate left-sided foraminal narrowing. At C5-6 also there is diffuse osteophyte complex causing mild canal and severe left-sided foraminal narrowing. At C4-5 there is prominent left paracentral disc osteophyte protrusion resulting in displacement and indentation of the left hemicord and mild canal stenosis. Overall these changes appear to have progressed slightly compared with previous MRI scan dated 06/08/2009. In addition, personally reviewed the images through the PACS system.    We called her with her test results. I suggested referring her to a neurosurgeon. She was agreeable. We sent a referral to [facility]. She had an  upper extremity EMG and nerve conduction test on 09/02/2015: IMPRESSION: This is a normal study. No electrodiagnostic evidence for neuropathy of cervical radiculopathy at this time.   We called her with her test results.   Today, 09/10/2015: She reports no new symptoms. She saw Dr. Dayton Bailiff on 09/02/2015. I have requested his office notes. They have not been sent as yet. We will review his records when available. She reports intermittent  tingling sensations in the left arm. It sounds like they discussed possible epidural steroid injections. She says she can live with the symptoms at this time. We discussed all her test results today.   08/20/2015: She reports intermittent left arm numbness and tingling for the past 3 to 4 weeks, non progressive, with fairly abrupt onset. No CP/SOB/diaphoresis/HA/Neck pain. Mild discomfort with it, no actual pain, maybe mild weakness, lasts 2 minutes, no spread, no triggers or alleviating factors, no recent injuries or falls. The tingling seems to involve the entire left arm, starting right at the shoulder joints, does not seem to radiate necessarily from the neck and does not seem to be associated with neck movements or certain arm movements. I reviewed your office note from 07/31/15, which you kindly included. Recent blood work through your office was reviewed from 03/22/2015. This includes a vitamin D level at 36, urinalysis negative, CBC with platelets was negative, CMP unremarkable, lipid profile showed total cholesterol of 240, LDL of 156, both elevated and triglycerides at 128. She drinks caffeine in the form of coffee, one cup every day at 1 soda per day. She does not always drink enough water she admits. She drinks very little alcohol.  Her Past Medical History Is Significant For: Past Medical History:  Diagnosis Date  . Abnormal finding of blood chemistry   . Asthma   . H/O measles   . H/O varicella   . Hypertension   . 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[place])   . 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 Lichen sclerosus 63/01/60   Asymptomatic  . Low iron   . Mitral valve prolapse   . Osteoarthritis   . Osteoporosis   . Pneumonia   . Post herpetic neuralgia   . Rheumatoid arthritis(714.0)   . Yeast infection     Her Past Surgical History Is Significant For: Past Surgical History:  Procedure Laterality Date  . CHOLECYSTECTOMY  2011  . IR ANGIO INTRA EXTRACRAN SEL COM CAROTID INNOMINATE BILAT  MOD SED  06/18/2020  . IR ANGIO VERTEBRAL SEL SUBCLAVIAN INNOMINATE UNI R MOD SED  06/18/2020  . IR ANGIO VERTEBRAL SEL VERTEBRAL UNI L MOD SED  06/18/2020  . WISDOM TOOTH EXTRACTION      Her Family History Is Significant For: Family History  Problem Relation Age of Onset  . Asthma Mother   . Anemia Mother   . Polymyalgia rheumatica Mother   . COPD Father   . Pulmonary fibrosis Father   . Breast cancer Maternal Grandmother 88    Her Social History Is Significant For: Social History   Socioeconomic History  . Marital status: Married    Spouse name: Not on file  . Number of children: 2  . Years of education: BA  . Highest education level: Not on file  Occupational History  . Occupation: Retired  Tobacco Use  . Smoking status: Never Smoker  . Smokeless tobacco: Never Used  Vaping Use  . Vaping Use: Never used  Substance and Sexual Activity  . Alcohol use: No  . Drug use: No  .  Sexual activity: Yes  Other Topics Concern  . Not on file  Social History Narrative   Drinks 1 cup of coffee and 1 diet coke a day    Social Determinants of Radio broadcast assistant Strain: Not on file  Food Insecurity: No Food Insecurity  . Worried About Charity fundraiser in the Last Year: Never true  . Ran Out of Food in the Last Year: Never true  Transportation Needs: No Transportation Needs  . Lack of Transportation (Medical): No  . Lack of Transportation (Non-Medical): No  Physical Activity: Not on file  Stress: Not on file  Social Connections: Not on file    Her Allergies Are:  Allergies  Allergen Reactions  . Penicillins Other (See Comments)    Reaction unknown occurred during childhood Has patient had a PCN reaction causing immediate rash, facial/tongue/throat swelling, SOB or lightheadedness with hypotension: Unknown Has patient had a PCN reaction causing severe rash involving mucus membranes or skin necrosis: Unknown Has patient had a PCN reaction that required  hospitalization: No Has patient had a PCN reaction occurring within the last 10 years: No If all of the above answers are "NO", then may proceed with Cephalosporin use.  Unsur  . Remicade [Infliximab] Other (See Comments)    Reaction unknown  . Sulfa Antibiotics Other (See Comments)    Reaction unknown  :   Her Current Medications Are:  Outpatient Encounter Medications as of 06/26/2020  Medication Sig  . albuterol (VENTOLIN HFA) 108 (90 Base) MCG/ACT inhaler Inhale 2 puffs into the lungs every 6 (six) hours as needed for wheezing or shortness of breath.  Marland Kitchen amitriptyline (ELAVIL) 25 MG tablet Take 25 mg by mouth at bedtime.  . benzonatate (TESSALON) 200 MG capsule Take 1 capsule (200 mg total) by mouth 3 (three) times daily as needed for cough.  . beta carotene w/minerals (OCUVITE) tablet Take 1 tablet by mouth daily.  . Biotin 1000 MCG tablet Take 1,000 mcg by mouth daily.  . cholecalciferol (VITAMIN D) 1000 UNITS tablet Take 1,000 Units by mouth daily.  . Estradiol 10 MCG TABS vaginal tablet Place 10 mcg vaginally daily as needed (dryness).   . gabapentin (NEURONTIN) 300 MG capsule Take 600 mg by mouth at bedtime.  Marland Kitchen lactulose (CHRONULAC) 10 GM/15ML solution Take 45 mLs (30 g total) by mouth 2 (two) times daily.  Marland Kitchen losartan (COZAAR) 50 MG tablet Take 1 tablet (50 mg total) by mouth daily.  . melatonin 5 MG TABS Take 1 tablet (5 mg total) by mouth at bedtime.  . methylPREDNISolone (MEDROL) 4 MG tablet Take 1 tablet (4 mg total) by mouth every other day. Alternate with the 36m dose (Patient taking differently: Take 4 mg by mouth daily.)  . mometasone-formoterol (DULERA) 100-5 MCG/ACT AERO Inhale 2 puffs into the lungs 2 (two) times daily. (Patient taking differently: Inhale 2 puffs into the lungs daily.)  . Nintedanib (OFEV) 100 MG CAPS Take 1 capsule (100 mg total) by mouth 2 (two) times daily.  . pantoprazole (PROTONIX) 40 MG tablet Take 1 tablet (40 mg total) by mouth 2 (two) times  daily before a meal.  . polyethylene glycol (MIRALAX / GLYCOLAX) 17 g packet Take 17 g by mouth 2 (two) times daily. (Patient taking differently: Take 17 g by mouth daily as needed for moderate constipation.)  . pravastatin (PRAVACHOL) 20 MG tablet Take 1 tablet (20 mg total) by mouth every evening.  . riTUXimab (RITUXAN) 100 MG/10ML injection Inject 100 mg  into the vein every 6 (six) months.   . senna-docusate (SENOKOT-S) 8.6-50 MG tablet Take 2 tablets by mouth 2 (two) times daily. (Patient taking differently: Take 1 tablet by mouth daily as needed for mild constipation.)  . sulfamethoxazole-trimethoprim (BACTRIM DS) 800-160 MG tablet Take 1 tablet by mouth 3 (three) times a week. Take on Mon, Wed, Fri (Patient taking differently: Take 1 tablet by mouth every Monday, Wednesday, and Friday.)  . venlafaxine XR (EFFEXOR-XR) 75 MG 24 hr capsule Take 75 mg by mouth at bedtime.   Marland Kitchen ipratropium-albuterol (DUONEB) 0.5-2.5 (3) MG/3ML SOLN Take 3 mLs by nebulization 3 (three) times daily for 5 days. (Patient taking differently: Take 3 mLs by nebulization every 4 (four) hours as needed (shortness of breath). )  . [DISCONTINUED] HYDROcodone-homatropine (HYCODAN) 5-1.5 MG/5ML syrup Take 5 mLs by mouth every 6 (six) hours as needed for cough. (Patient not taking: Reported on 06/12/2020)   No facility-administered encounter medications on file as of 06/26/2020.  :  Review of Systems:  Out of a complete 14 point review of systems, all are reviewed and negative with the exception of these symptoms as listed below: Review of Systems  Neurological:       Pt presents today to for follow up. After her IR procedure pt felt badly and ended up in the ED. She feels better now.    Objective:  Neurological Exam  Physical Exam Physical Examination:   Vitals:   06/26/20 1441  BP: (!) 145/74  Pulse: 76    General Examination: The patient is a very pleasant 79 y.o. female in no acute distress. She appears  well-developed and well-nourished and well groomed.   HEENT:Normocephalic, atraumatic, pupils are equal, round and reactive to light, status post bilateral cataract repairs. Extraocular tracking is good without limitation to gaze excursion or nystagmus noted. Hearing is grossly intact. Face is symmetric with normal facial animation and normal facial sensation. Speech is clear with no dysarthria noted. There is no hypophonia. There is a mild intermittent neck and voice tremor. Neck is supple with full range of passive and active motion. There are no carotid bruits on auscultation. Oropharynx exam reveals: mild mouth dryness, adequate dental hygiene. Tongue protrudes centrally and palate elevates symmetrically.  Chest: Prominent bilateral diffuse crackles and also expiratory wheezing noted. She also has intermittent noisy breathing.    Heart:S1+S2+0, regular and normal without murmurs, rubs or gallops noted.   Abdomen:Soft, non-tender and non-distended with normal bowel sounds appreciated on auscultation.  Extremities:There is no pitting edema in the distal lower extremities bilaterally.  Skin: Warm and dry without trophic changes noted. There are no varicose veins. Mild old appearing bruising is noted on the forearms and hands.   Musculoskeletal: exam reveals arthritic changes in both hands.   Neurologically:  Mental status: The patient is awake, alert and oriented in all 4 spheres. Her immediate and remote memory, attention, language skills and fund of knowledge are appropriate. There is no evidence of aphasia, agnosia, apraxia or anomia. Speech is clear with normal prosody and enunciation. Thought process is linear. Mood is normal and affect is normal.  Cranial nerves II - XII are as described above under HEENT exam. In addition: shoulder shrug is normal with equal shoulder height noted. Motor exam: Normal bulk, strength and tone is noted.  There is no significant increase in tone, no  hand tremor noted.  Romberg is not tested secondary to safety concerns. Fine motor skills and coordination: grossly intact.  Cerebellar testing: No  dysmetria or intention tremor.   Sensory exam: intact and unchanged to light touch.  Gait, station and balance: She stands up slowly, she requires no assistance but does push herself up and stands mildly wide-based.  She walks without a walking aid. She has a slightly wider based gait.  She walks without shuffling or without evidence of magnetic gait, more with a nonspecific gait insecurity and insecure turns. She denies any lightheadedness upon standing and denies vertiginous symptoms   Assessment and Plan:   In summary, SHARDAY MICHL is an 79 year old female with an underlying complex medical history of hyperlipidemia, allergies, postherpetic neuralgia, osteoporosis, interstitial lung disease, history of Covid pneumonia in December 2020, fall with history of rib fractures in December 2020, rheumatoid arthritis, and hospitalization in August 2021 for acute on chronic respiratory failure with hypoxia, whopresents for  follow-up consultation of her 2+ year history of balance issues, history of falls.  She had a finding of ventriculomegaly on CT angiogram of the head.  She had a conventional angiogram without any obvious or significant intracranial stenosis.  We had discussed evaluation for NPH but mutually agreed to avoid a large-volume spinal tap and any invasive treatment such as a VP shunt.  Most likely she has a more nonspecific and multifactorial gait disorder and balance issue.  She is advised to stand up slowly, get her bearings first, turn slowly, avoid sudden turns and sudden bending, try not to carry anything up or down stairs.  She is advised to stay really well-hydrated and well rested.  At this juncture, she is advised to follow-up with her lung specialist as scheduled and her primary care physician routinely. I answered all her questions today  and she was in agreement.  I spent 30 minutes in total face-to-face time and in reviewing records during pre-charting, more than 50% of which was spent in counseling and coordination of care, reviewing test results, reviewing medications and treatment regimen and/or in discussing or reviewing the diagnosis of gait d/o, the prognosis and treatment options. Pertinent laboratory and imaging test results that were available during this visit with the patient were reviewed by me and considered in my medical decision making (see chart for details).

## 2020-07-08 ENCOUNTER — Ambulatory Visit: Payer: Medicare Other | Admitting: Neurology

## 2020-07-15 ENCOUNTER — Ambulatory Visit: Payer: Medicare Other | Admitting: Neurology

## 2020-07-19 DIAGNOSIS — J849 Interstitial pulmonary disease, unspecified: Secondary | ICD-10-CM | POA: Diagnosis not present

## 2020-07-19 DIAGNOSIS — M05711 Rheumatoid arthritis with rheumatoid factor of right shoulder without organ or systems involvement: Secondary | ICD-10-CM | POA: Diagnosis not present

## 2020-07-23 ENCOUNTER — Telehealth (HOSPITAL_COMMUNITY): Payer: Self-pay

## 2020-07-23 NOTE — Telephone Encounter (Signed)
Pt insurance is active and benefits verified through Medicare a/b Co-pay 0, DED $233/0 met, out of pocket 0/0 met, co-insurance 20%. no pre-authorization required.  2ndary insurance is active and benefits verified through [employer]. Co-pay 0, DED 0/0 met, out of pocket 0/0 met, co-insurance 0%. No pre-authorization required.

## 2020-07-30 ENCOUNTER — Telehealth (HOSPITAL_COMMUNITY): Payer: Self-pay | Admitting: *Deleted

## 2020-08-01 ENCOUNTER — Encounter: Payer: Medicare Other | Admitting: *Deleted

## 2020-08-01 ENCOUNTER — Ambulatory Visit (INDEPENDENT_AMBULATORY_CARE_PROVIDER_SITE_OTHER): Payer: Medicare Other | Admitting: Internal Medicine

## 2020-08-01 ENCOUNTER — Encounter: Payer: Self-pay | Admitting: Internal Medicine

## 2020-08-01 ENCOUNTER — Other Ambulatory Visit: Payer: Self-pay

## 2020-08-01 VITALS — BP 118/64 | HR 83 | Temp 97.0°F | Ht 66.0 in | Wt 134.4 lb

## 2020-08-01 DIAGNOSIS — R634 Abnormal weight loss: Secondary | ICD-10-CM

## 2020-08-01 DIAGNOSIS — Z7189 Other specified counseling: Secondary | ICD-10-CM

## 2020-08-01 DIAGNOSIS — T50905A Adverse effect of unspecified drugs, medicaments and biological substances, initial encounter: Secondary | ICD-10-CM

## 2020-08-01 DIAGNOSIS — Z7185 Encounter for immunization safety counseling: Secondary | ICD-10-CM

## 2020-08-01 DIAGNOSIS — J849 Interstitial pulmonary disease, unspecified: Secondary | ICD-10-CM

## 2020-08-01 DIAGNOSIS — Z79899 Other long term (current) drug therapy: Secondary | ICD-10-CM

## 2020-08-01 NOTE — Progress Notes (Signed)
Brief patient profile:  80 yowf  never smoker with allergies/inhalers as child outgrew by [employer] then  RA since around 2000  Prednisone  x decades and prev eval by Dr Joya Gaskins around 2004 for sob resolved s maint rx and referred 05/26/2013 by Dr Shelia Media for bronchitis and abn cxr   History of Present Illness  05/26/2013 1st [facility] office visit/ Wert cc June 2014 dx pna  In [place] and remicade stopped and 100% better and placed arencia in September 2014  then abruptly worse first week in November with cough green sputum s nasal symptoms, fever low grade and no cp or cough and completely recovered prior to [place] does not recall abx but issue is why keeps getting sick and abn CT Chest (see below).   Arthritis symptoms well controlled at present on Rx for RA rec Nexium 40 mg Take 30-60 min before first meal of the day and add pepcid 20 mg one at bedtime whenever coughing.     10/19/2017  f/u ov/Wert re:  RA lung dz  Chief Complaint  Patient presents with  . Follow-up    Cough is much improved, but has not resolved yet. Cough is non prod. She has not had to use her neb.   Dyspnea:  Not limited by breathing from desired activities  But some doe x steps Cough: daytime > noct dry  Sleep: fine  SABA use:  No saba Medrol 4 mg a/w 58m per day/ ok control of arthritis  rec Start back on gabapentin up to 300 mg each am  in addition to the the two at bedtime  If not better increase the medrol to 8 mg daily until bettter then taper back to where you      01/10/2018  f/u ov/Wert re:  RA  Lung dz Medrol  4 mg  One alternating with a half Chief Complaint  Patient presents with  . Follow-up    PFT's done. Her breathing has been gradually worsening since the last visit. She has occ cough- non prod.   Dyspnea gradually worse since last ov:  MMRC1 =  MMRC3 = can't walk 100 yards even at a slow pace at a flat grade s stopping due to sob    Gradually x 3 m / more fatigue / no change in  arthritis  Cough: not an issue rec Protonix 40 mg Take 30-60 min before first meal of the day  GERD diet   01/17/2018 acute extended ov/Wert re: cough on medrol 4 mg  One a/w one half  Chief Complaint  Patient presents with  . Acute Visit    started coughing 01/11/18- occ prod with minimal green sputum.  She states also wheezing and having increased SOB.    abruptly worse 01/11/18 with severe 24/7 coughing >>  prod min green mucus esp in am/ assoc with subjective wheeze and did not follow previous contingencies re flutter / saba/ increase medrol and admits she does not rember those written instructions nor how to use the neb provided .  No fever/ comfortable at rest sitting  rec For cough > mucinex dm 1200 mg every 12 hours and cough into the flutter valve as much as possible  Doxycycline 100 mg twice daily x 10 days with glass of water Medrol 441mx 2 now and take 2  daily until cough is better then 1 daily x 5 days and then resume the previous dose  Shortness of breath/ wheezing/ still coughing >  albuterol neb every 4 hours as needed      01/20/2018 acute extended ov/Wert re: refractory cough and sob 01/11/18 Chief Complaint  Patient presents with  . Acute Visit    she is not feeling better, coughing , very SOB, wheezing  mucus now clear/scant  on doxy/ neb machine not working (tube would not plug into the side s adequate force and she was not capable of applying it due to RA hands. Cough/ wheeze/ sob 24/7 / flutter not helping/ can't lie down at hs   rec While coughing protonix 40 mg Take 30- 60 min before your first and last meals of the day  Shortness of breath/ wheezing/ still coughing > albuterol neb every 4 hours as needed  Depomedrol 120 mg IM and medrol 32 mg daily x 2 days,  then 16 mg x 3 days,  Then 8 mg x 4 days , then resume the 4 mg daily  For severe cough > tylenol 3# one every 4 hours if needed  Go to ER if condition worsens on above plan       Date of admission:  01/22/2018             Date of discharge: 01/27/2018   History of present illness: As per the H and P dictated on admission, "[name] a80 y.o.female,w Rheumatoid arthritis, ILD Asthma, apparently c/o increase in dyspnea this evening. Dry cough. Denies fever, chills, cp, palp, N/v, diarrhea, brbpr, black stool. Pt notes recently being given steroid injection in office as well as being placed on doxycycline. This might have helped slightly but pt worse Hospital Course:  Summary of her active problems in the hospital is as following. 1 dyspnea/hypoxemia/ILD Concerned that likely GERD Is causing ILD. Patient with cough.  Patient with complaints of awakening with cough and also with oral intake which is slightly improved since 01/24/2018. assessed by speech therapy and speech therapy raising concern of esophageal component but no signs of aspiration.  2D echo with a EF of 55 to 60% with no wall motion abnormalities, grade 1 diastolic dysfunction.  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 Pulmonary felt that the patient should be on scheduled Reglan. I have placed the patient on scheduled potassium before sleep. Continue steroids on discharge continue Mucinex and Claritin as well as inhalers. Patient will follow-up with pulmonary outpatient  2. Gastroesophageal reflux disease Continue PPI and H2 blocker.I changed PPI to [facility].  3. Rheumatoid arthritis Outpatient follow-up.   4. Anxiety Continue Effexor.    All other chronic medical condition were stable during the hospitalization.  Patient was ambulatory without any assistance. On the day of the discharge the patient's vitals were stable , and no other acute medical condition were reported by patient. the patient was felt safe to be discharge at home with family.  Consultants: PCCM  Procedures: Echocardiogram      03/21/2018  f/u ov/Wert re:   S/p admit was  transiently better  and downhill since Labor day on medrol 4 mg daily  Last orencia on Sept 4th 2019  Chief Complaint  Patient presents with  . Acute Visit    Per patient, she has had a dry cough since July 2019. She has been wheezing as well. Increased fatigue. Body aches. Denies any fever or chills.   Dyspnea:  MMRC4  = sob if tries to leave home or while getting dressed   Cough: harsh/ hacking mostly dry/ has flutter not using    SABA  use: not much better with rx   No obvious day to day or daytime variability or assoc excess/ purulent sputum or mucus plugs or hemoptysis or cp or chest tightness, subjective wheeze or overt sinus or hb symptoms.     Also denies any obvious fluctuation of symptoms with weather or environmental changes or other aggravating or alleviating factors except as outlined above   No unusual exposure hx or h/o childhood pna/ asthma or knowledge of premature birth.   INpatient consult 03/26/18 80 year old with rheumatoid arthoritis.At baseline the patient lives at home with her husband and is independent of ADLs.  Has been on many immune suppressants over decades and curently on orencia x 4 year and prednisone. Does not recollect being on bactrim/dapsone. Chart mentions BOOP/MAI in 2001 but she denies this. 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 Got admitted. Rx with steroids. Per Notes - clnical suspicion of  arytenoid inflmmation related wheeze noticed (she also reports asthma NOS). Follolwup with ENT recommended (but not seen one as yet). She also appears to have passed swallow with rec for regular diet with thin liquids but did to have mild eso stricture and reflux during testing . PFT shows 10% FVC decline for first ime. CT chest at this tme (aug 2019) showed new rLL infiltrate. ECHO July 2019 without evicence of  elevated PASP and saw cards [facility] June 2019 and was considered to have worsenin dyspnea due to [facility] issues (reports stress test at [facility] that was normal but I cannot see it)  She reports after discharge she got better but in last several weeks has deteriorated with cough and dyspnea. There is new hypoxemia (currently RA with nail polish and poor circulation - 89% pulse ox) needing 2L [place]. Per [facility] improved with steroids and abx. CTA 03/24/2018 => shows that RLL inifltrate has improved . Other chronic ILD changes + and small  Hiatal hernia + witthout change.  Review of lab work does show eosinophilia at time of admision  EVENTS 03/21/18 - IgE -5, blood allegy panel - negative, 03/23/2018 - - admit . HIGH EOS 2300, ESR 48, BNP 89 , HIV neg 9/12- PCT negative, RVP negative 9/14 -  PCT < 0.1, Urine strep - negative, MRSA PCR - positive. IgE - normal 4, Blood allergy panel repeat - negative 03/26/2018 - leading consideration for airway (BO in RA +/- asthma) related flare either due to MRSA bronchitis or clinical suspicion of arytenoid inflmmation +/- GERD relatd flare (she has small hiatal hernia)  +/- ? Dysphagia  up causing mild hypoxemia acute resp failure, wheeze . Allergy and IgE blood work negative thought. Patient reports being better but says she is choking on drinkin water [facility] MD says wheeze improved significantly with steroids.  RN says was down to RA yesterday evening but needed 1L [place] at sleep. Today -Room air at rest 94% and desaturated to 86% walking 60 feet 03/27/2018  - better. Off o2 at rest. STill coughs with water and when lies down.  Husband at bedside. Both requesting ILD clinic followup . Desaturated t 79% walking 90 feet.   OV 04/12/2018  Subjective:  Patient ID: Stacey [name], female , DOB: 1941-02-17 , age 87 y.o. , MRN: [id number] , ADDRESS: [place] [address]   04/12/2018 -   Chief  Complaint  Patient presents with  . Consult    Pt is a former  [facility] pt.  Pt denies any current complaints of cough, SOB, or CP but states the cough she originally had ended her up in the hosp 9/11-9/17 with dx acute respiratory failure. Pt does wear 2pulse with exertion and also wears 2L continuous when at home.     HPI Stacey [name] 80 y.o. -presents for follow-up to the ILD clinic.  She is known to have rheumatoid arthritis with ILD changes.  She had been followed by Dr. Christinia Gully.  However in July 2019 in September 2019 she has had 2 admissions to the hospital with respiratory distress and hypoxemic respiratory failure.  In the first 1 that seem to be right lower lobe infiltrate and then she improved from it but in the second 1 even though the right lower lobe infiltrates were better she still was hypoxemic.  Acid reflux and dysphagia was considered a possible etiology but she passed swallow study 2 times.  They thought she had some reflux.  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  At the same time it is not clear if the ILD is progressive based on pulmonary function testing below   At this point in time she tells me that she is getting home physical therapy.  Her fatigue is improving but it is not fully resolved.  She was discharged on continuous oxygen which she is using.  However she is feeling less short of breath.  Today in fact when we turned her oxygen off and walked her she did not desaturate and this is a significant improvement.  She is on monthly Orencia through the [facility] rheumatologist Dr. Eda Paschal.  At this point in time she is put the Orencia on hold.  She told me that she is been on Orencia for 4 years and never had a respiratory exacerbation still recently x 2.  Although before going on Orencia she had pneumonia while on Remicade and the Remicade.  In terms of her rheumatoid arthritis she hardly has any pain.  Her joint architecture is fairly well-preserved because of various  immunomodulators over time.  She says that she was on Remicade for years and when she stopped it for 8 weeks before the switch to [place] she never really had a relapse in her rheumatoid arthritis.  She is largely pain and stiffness free.  She believes she can go without  her Orencia for a while.  Review of the literature shows greater than 10% chance of a respiratory infection especially COPD exacerbation.  Although the time frame for this is unclear.       OV 06/07/2018  Subjective:  Patient ID: Stacey [name], female , DOB: 1941/06/06 , age 5 y.o. , MRN: [id number] , ADDRESS: [place] [address]   06/07/2018 -   Chief Complaint  Patient presents with  . Follow-up    ILD, PFT done today, some wheezing and coughing but better tha before   Rheumatoid arthritis ILD and asthma/obstructive lung disease phenotype on Dulera  HPI Stacey [name] 80 y.o. -presents for routine follow-up with her husband.  She is here to follow-up with [facility] Dr. Stann Mainland.  She plans to do this in December 2019.  She continues to be off Orencia.  Her joints are slowly getting stiff again.  She believes that she will need to be back on immunosuppression agent again.  She currently  continues Medrol 4 mg alternating with 2 mg.  This for her rheumatoid arthritis.  In terms of her joints she continues on Medrol 4 mg alternating with 2 mg but not on any other immunosuppression agent.  Overall she is been stable but for the last 2 weeks has had green sputum and wheezing and chest congestion and cough.  She recently visited her husband who was hospitalized and walking the long hallways at [facility] made a short of breath but she thinks this is probably baseline for her.  There are no other new issues.  She did have spirometry and DLCO and this shows a decline compared to September 2019 and a similar to July 2019.  It is documented below.  This is probably reflective of a  flareup   OV 07/21/2018  Subjective:  Patient ID: Stacey [name], female , DOB: 1941/05/03 , age 39 y.o. , MRN: [id number] , ADDRESS: [place] [address]   07/21/2018 -   Chief Complaint  Patient presents with  . Follow-up    Pt states she has been doing well since last visit. States she is about to begin Rituxan with [facility]. Pt still becomes SOB with exertion. Denies any complaints of cough or CP.   Rheumatoid arthritis ILD and asthma/obstructive lung disease phenotype on Dulera  HPI Stacey [name] 80 y.o. -presents for follow-up of the above.  Last seen just before Thanksgiving 2019.  In the interim overall stable although on June 23, 2018 she climbs a steep flight of stairs which is unusual exertion for her and she became very dyspneic.  Following day saw Dr. Stann Mainland at [facility] rheumatology and was given Z-Pak and prednisone and started feeling better.  Although it is not fully clear to me she had fever and bronchitic symptoms.  I reviewed Dr. Stann Mainland note.  Dr. Stann Mainland is decided to start Rituxan for rheumatoid arthritis.  She is only having some minimal joint pain at this point.  She is off Orencia and continues to be off [place].  She did have some blood work with [place] before starting Rituxan.  She is due to see Dr. Stann Mainland within the next week and start her Rituxan.  Her liver function test July 18, 2017 is normal hemoglobin is normal.  CRP is also normal.  We did spirometry and walking desaturation test.  These show improvement compared to before and these are documented below.  Currently she not using nighttime or daytime oxygen.  She is wondering if she could switch rheumatology care to [facility].  This is because while she likes [employer] she is getting older and more frail and feels some body local would be of help.  I have sent a message to Dr. Estanislado Pandy inquiring.  Certainly we can help her with Rituxan infusions at [facility]  if needed.  She will check on this with her [facility] rheumatologist.       OV 03/27/2019  Subjective:  Patient ID: Stacey [name], female , DOB: 12/31/1940 , age 3 y.o. , MRN: [id number] , ADDRESS: [place] [address]  Rheumatoid arthritis ILD and asthma/obstructive lung disease phenotype on [facility]   03/27/2019 -  Rourine fu   HPI Stacey [name] 80 y.o. -presents for the above.  Last seen in January 2020.  After that she has seen Dr. Stann Mainland rheumatology at [facility].  She is now getting Rituxan 2 doses every 6 months.  She says this  is helped her joints and her stiffness.  She is a little bit more mobile than usual.  However in terms of her respiratory status she continues to have episodic cough.  In June 2020 she again got hypoxemic and got admitted.  Since then she has had episodic cough.  She had a respiratory exacerbation in June 2020 for the admission she got steroids.  This seemed to help.  She is also on a higher dose Dulera right now.  In terms of her cough this seems to be her biggest problem.  She seems to be on Dulera, Singulair scheduled with also Tessalon and Delsym and DuoNeb.  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  Her last high-resolution CT scan of the chest was 1 year ago.  She says the dyspnea itself is not worse.  She is currently not using oxygen.  On exam she did have some wheezing.  Currently she has white and brown sputum but this is baseline.  In terms of a COVID wrist she has been tested recently couple of times and this is been negative.  She is isolating well.  She wanted to know COVID prevention activities and risk status and masking strategies.      OV 05/24/2019  Subjective:  Patient ID: Stacey [name], female , DOB: May 20, 1941 , age 86 y.o. , MRN: [id number] , ADDRESS: [place]  [id number]   05/24/2019 -   Chief Complaint  Patient presents with  . Follow-up    Pt was recently in the hosp due to ILD. Pt states that she has been better since being out of the hosp.   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 -returns for follow-up.  At the last visit approximately 2 months ago she was reporting worsening cough following an admission in summer 2020.  Her pulmonary function test suggested worsening ILD status.  Therefore we requested a high-resolution CT chest which she did in October 2020.  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  However in the interim after the CT scan was done towards the end of October 2020 she developed worsening of her cough over 2 weeks and also associated shortness of breath but significantly the cough is much worse.  She ended up getting admitted to the hospital.  There was some hypoxemia.  By this time she had finished a ENT evaluation that did not show any involvement of the arytenoids.  Pulmonary was consulted.  She was given a prednisone burst which she just finished I believe yesterday.  She is back on her baseline Medrol.  And she is feeling better.  Her oxygen status is improved although she is using oxygen at night now.  She is really frustrated with these recurrent flareups and these admissions which ended up with her having a wheeze and also hypoxemia.  Currently she is on her baseline Medrol for rheumatoid arthritis associated with Dulera and Singulair.  She is also on losartan for blood pressure.  Her walking desaturation test is slightly worse than baseline.  She has a GI consult pending because of the recurrent episodes of cough and flareups.  We went over exposure history.  We used interstitial lung disease questionnaire for the exposure history.  Specifically she denies any  electronic cigarette use of marijuana use of cocaine use or any IV drug abuse.  [place] last 14 years.  Asked extensive questions about the home environment it is positive for nebulizer use but the nebulizer does not have mildew or mold in it.  Otherwise no organic antigen exposure.  The house is not damp.  There is no mold or mildew in the shower curtain.  There is no humidifier use no steam iron use.  No Jacuzzi use.  No misting [place] outside to inside the house.  No pet birds.  No pet gerbils no feather pillows.  There is no mold in the [facility] duct.  She does not do any gardening.  Does not use wind instruments.  Also 122 question occupational history elicited and essentially negative.  The other issue is that she has polypharmacy.  She is asking for my help in reducing her medications.  She is on 3 medications for postherpetic neuralgia.  She is on losartan      OV 06/21/2019  Subjective:  Patient ID: Stacey [name], female , DOB: 15-Jul-1940 , age 41 y.o. , MRN: [id number] , ADDRESS: [place] [address]   06/21/2019 -   Chief Complaint  Patient presents with  . televisit    hosp 11/29-12/4 due to covid with pna. pt said that she is doing okay after recent hosp but states she has no energy.      Stacey [name] 80 y.o. - last visit 05/24/2019. Diagnosed with covid-19 on 05/31/2019. Admitted 06/11/2019  - 06/16/2019. Quarantine ends 06/21/2019 today. Rx with Remdesviri and Decadron. Husband also on phone. Questions  1. Quarantine ends - from 06/22/19\ and she is not contagious and likely resistant to reinfection to covid for another few months  2. She is on 10 day dexamethasone for covid - today is last day for it  3. She is on dulera. Hospital gave combivent and she does not want do combivent - this is fine  4. Ofev for ILD - not started it yet . She had questions about side effects. Explained GI and  LFT monirtong. SHe wanted to wait till Jan 2021 and start it . I am ok with that.   5. Small hiat   IMPRESSION: HRCT OCt 2020 1. 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 Findings have progressed since 2017 and 2019 high-resolution chest CT studies. 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 Findings are consistent with UIP per consensus guidelines: Diagnosis of Idiopathic Pulmonary Fibrosis: An Official ATS/ERS/JRS/ALAT Clinical Practice Guideline. Page, Iss 5, [phone], Mar 13 2017. 2. 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 4. Small hiatal hernia.  Aortic Atherosclerosis (ICD10-I70.0).   Electronically Signed   By: Ilona Sorrel M.D.   On: 04/24/2019 13:29   OV 08/03/2019 - face to face visit  Subjective:  Patient ID: Stacey [name], female , DOB: March 25, 1941 , age 17 y.o. , MRN: [id number] , ADDRESS: [place] [address]    Rheumatoid arthritis with Joint s/ILD- Rituxan/steroids ([facility])  - progressive phenotype (CT 2017/2019 -> Oct 2020). Planned Ofev start in Nov 2020 but did not start as of dec 2020. LFT normal Jan 2021  Athma/obstructive lung disease phenotype on Engineer, production  herpetic neuralgia - on elavil, gabapentin, effexor  Coronary Artery Calcification - 1 vessel - cardiology referral done  Diagnosed with covid-19 on 05/31/2019. Admitted 06/11/2019  - 06/16/2019. Rx with Remdesviri and Decadron.  Small Hiatal hernia onCT Oct 2020  Rib fracture from fall Dec 2020: Musculoskeletal: There is nondisplaced fractures of the posterior left fifth, 6, 8, 9, and tenth left ribs. There is unchanged slight superior compression deformities of the T5, T6, T10 vertebral bodies with less than 25% loss in height.Associated  pulmnary contusion +    HPI Stacey [name] 80 y.o. -presents for a visit. Her nintedanib has been delayed because of COVID-19. Also subsequently a fall. She saw a nurse practitioner on July 20, 2019 and they took a shared decision to start nintedanib. She had a ? telephone visit with Dr. Candie Mile the rheumatologist at [facility]. Patient scheduled for her next Rituxan in February 2021 but reviewed the notes indicates this could be pushed to early spring 2021. Dr. Stann Mainland is okay with the patient study got intubated at this point in time.  Patient has follow-up with Dr. Stann Mainland tomorrow at [facility].  At this face-to-face visit she is here with her husband.  She says she is much better after the Covid and also the fall that fractured her ribs.  Nevertheless all this is left her fatigue.  Infective fatigue scores are much worse.  Also in the last 2 weeks has had increased cough wheezing and shortness of breath.  The sputum was also changed color in the last 1 week to clear green.  This is all new.  Her symptom score is therefore a worse than her baseline.  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  She is only taking 100 mg once a day.  The plan was to go up to 100 mg twice a day next week.  This is a minimum effective dose.  The maximum dose is 150 mg twice a day.  Given her age and comorbidities we are starting at a low dose.  Part of this visit is to make sure that she is tolerating the drug fine.  And so far she is.  She is interested in the ILD-pro registry study done by the [facility].   She is working with physical therapy for her fatigue.  Ambulatory Walk 07/20/2019 2 Lap- O2 92% RA; HR 109 No shortness of breath She did not need to stop Used walker    OV 09/20/2019  Subjective:  Patient ID: Stacey [name], female , DOB: 1940/10/16 , age 61 y.o. , MRN: [id number] , ADDRESS: [place] [address]   09/20/2019 -   Chief Complaint  Patient presents with  . Televisit    Called and spoke with pt who stated she has been feeling okay since last visit. Pt stated she is still taking OFEV and denies any complaints. Pt states she believes her breathing is stable at this point.    Rheumatoid arthritis with Joint s/ILD- Rituxan/steroids ([facility])  - progressive phenotype (CT 2017/2019 -> Oct 2020). Planned Ofev start in Nov 2020 but did not start as of dec 2020. 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 Admitted 06/11/2019  - 06/16/2019. Rx with Remdesviri and Decadron.  Small Hiatal hernia on CT Oct 2020  Rib fracture from fall Dec 2020: Musculoskeletal: There is nondisplaced fractures of the posterior left fifth, 6, 8, 9, and tenth left ribs. There is unchanged slight superior compression deformities of the T5, T6, T10 vertebral bodies with less than 25% loss in height.Associated pulmnary contusion +   HPI Stacey [name] 80 y.o. - has 2nd covid vaccine later this week. Rituxan is end of the month. Baseline RA regiment is On dulera for associated obstructio of lung. Continue ofev 124m bid since mid jan 2021 for ILD. No side effects. Did see BDerl Barrowfor face to face visit early feb 2021 -> for bronchitis and felt better after prdnisone and zpak. Currently on medrol   8 mg day and staying there per BDerl Barrow. Wants t oknow if she can taper. Wants to know how she can prevent care flare ups. Explained ofev, masking and  social distancing prevent respiratory infection.Denies choking on food or aspirating. Denies mold in house - relatively news. Denies dog. Denies cat. Denies carious teeth. Denies post nasal drip  OVerll feels better and improved dyspnea.     OV 12/18/2019  Subjective:  Patient ID: MTamala [name] female , DOB: 6November 02, 1942, age 80y.o. , MRN: [id number], [place] [id number] PCP RShon Baton MD Rheumatologist-Dr. JEda Paschalat [facility] Inc[facility]: Dr. RVeryl Speak 12/18/2019 -   Chief Complaint  Patient presents with  . Follow-up    no worse   Rheumatoid arthritis with Joint s/ILD- Rituxan/steroids ([facility] uinviesty)  - progressive phenotype (CT 2017/2019 -> Oct 2020). Planned Ofev start in Nov 2020 but did not start as of dec 2020. 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 Admitted 06/11/2019  - 06/16/2019. Rx with Remdesviri and Decadron.  Small Hiatal hernia on CT Oct 2020  Rib fracture from fall Dec 2020: Musculoskeletal: There is nondisplaced fractures of the posterior left fifth, 6, 8, 9, and tenth left ribs. There is unchanged slight superior compression deformities of the T5, T6, T10 vertebral bodies with less than 25% loss in height.Associated pulmnary contusion +  -On Reclast once a year through Dr. JennEda PaschalPI MargTamala Fothergilly68. 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  It has been a few month since I last saw her.  In this time she did not taper  her Medrol down.  She stated 8 mg/day.  She was originally taking the Medrol for her rheumatoid arthritis for many years.  She recently increased the dose from 4 mg / 2 mg every other day to 8 mg daily because of recurrent respiratory exacerbations.  She  is asking for [facility] refills for obstructive lung disease.  She is getting Rituxan through Dr. Eda Paschal for rheumatoid arthritis.  Her next Rituxan is in September 2021.  She saw Dr. Stann Mainland in April 2021 and reviewed the note.  Her liver function test at the time was fine.  In terms of her ILD she continues on nintedanib 100 mg twice daily.  She says she has no side effects from the drug she is tolerating it quite well.  We discussed about increasing the dose but she wants to hold off because she just got a new supply.  However she is open to increasing the dose when the supply runs out.  She is due for liver function test today.  In terms of overall symptoms things have improved as can be seen below and the symptom score.Marland Kitchen  However her main concern is that of fatigue.  She is open to attending pulmonary rehabilitation.  She is not using oxygen at home.  Sometime back when we tested overnight oxygen desaturation test she did not desaturate.  Walking desaturation test today stable.  She does notice of note that her blood pressure has gone up.  She is working with her primary care physician on this.  She is on losartan.    OV 04/10/2020   Subjective:  Patient ID: Stacey [name], female , DOB: Aug 12, 1940, age 62 y.o. years. , MRN: [id number],  ADDRESS: [address] [place] [address] PCP  Shon Baton, MD Providers : Treatment Team:  Attending Provider: Brand Males, MD   Chief Complaint  Patient presents with  . Follow-up    CT scan 8/23, has not increased Ofev, shortness of breath with exertion.    Rheumatoid arthritis with Joint s/ILD- Rituxan/steroids ([facility] uinviesty)  - progressive phenotype (CT 2017/2019 -> Oct  2020). Planned Ofev start in Nov 2020 but did not start as of dec 2020. 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 Admitted 06/11/2019  - 06/16/2019. Rx with Remdesviri and Decadron.  Admitted August 2021 for respiratory infection not otherwise specified.  Following trip to [place].  Was hypoxemic.  Small Hiatal hernia on CT Oct 2020  Rib fracture from fall Dec 2020: Musculoskeletal: There is nondisplaced fractures of the posterior left fifth, 6, 8, 9, and tenth left ribs. There is unchanged slight superior compression deformities of the T5, T6, T10 vertebral bodies with less than 25% loss in height.Associated pulmnary contusion +  -On Reclast once a year through Dr. Eda Paschal    HPI Stacey [name] 80 y.o. -returns for follow-up.  Last week by myself early June 2021.  Then after that in early August 2020 when she got admitted for respiratory infection not otherwise specified.  She was hypoxemic on room air.  This followed a trip to [place].  In the follow-up phase end of August 2020 when she had high-resolution CT chest that showed increased groundglass opacities but when compared to a CT from 8 months  ago.  Also chronic ILD had worsened.  I personally visualized the image at this point in time she is improved from this admission she is not using oxygen at all.  In fact walking desaturation test shows near baseline.  Nevertheless overall her symptom scores have declined over time ILD  symptom score is listed below.  She is really concerned about several issues.  In terms of her ILD she was inquiring about escalating to 150 mg twice daily.  Even at 100 mg twice daily she is significantly symptomatic in terms of nonrespiratory issues such as nausea and dizziness and fatigue.  I did caution her that these could all get worse.  Therefore she is just opted to be at 100 mg twice daily  -Recurrent admissions for respiratory issues [I did address that some of this was Covid and fall related and not necessarily her usual summer exacerbation].  Nevertheless, I asked about exposures at home.  She does not have a feather blanket or feather pillow or feather jacket.  She denies any mold or bird exposure or mildew exposure at home.  Does not do any gardening or wind instruments.  She is immunosuppressed and I did notice that she is not on Bactrim for PCP prophylaxis.  In 2019 when we checked G6PD the lab could not process this lab.  She is interested in Bactrim prophylaxis.  She is interested in okay getting G6PD rechecked   -She is dealing with significant amount of dizziness.  Neurology is sending her to neuro rehab.  It is believed it is multifactorial.  I reviewed her recent CT head angio report.  I personally visualized the image   -She is also worried about cough.  Currently cough is tolerable but she says prior to her exacerbations cough gets worse.  We did discuss with her that if she is aspirating which she denied.  We did discuss about the possibility of starting Bactrim and monitoring her cough and she is fine with that.  I did review old records Lungs/Pleura: Biapical pleuroparenchymal scarring. Patchy and largely peripheral peribronchovascular ground-glass, increased from 07/03/2019. Underlying subpleural reticulation, traction bronchiectasis/bronchiolectasis and scattered honeycombing, similar to minimally increased from 07/03/2019. Calcified granulomas. No pleural fluid. Airway  is unremarkable. No air trapping.  Upper Abdomen: Visualized portions of the liver, adrenal glands, kidneys, spleen, pancreas, stomach and bowel are unremarkable with the exception of a small hiatal hernia. Cholecystectomy. Calcified upper abdominal lymph nodes.  Musculoskeletal: Degenerative changes in the spine. No worrisome lytic or sclerotic lesions.  IMPRESSION: 1. Increased patchy pulmonary parenchymal ground-glass may be due to an atypical/viral pneumonia, including due to COVID-19. 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 2. Aortic atherosclerosis (ICD10-I70.0). Coronary artery calcification.   Electronically Signed   By: Lorin Picket M.D.   On: 03/04/2020 12:58     Results for KYNSLI, HAAPALA (MRN [id number]) as of 03/27/2019 09:43  Ref. Range 07/17/2013 09:53 02/07/2016 13:09 01/11/2017 11:05 01/10/2018 10:13 05/17/2018 14:24 06/07/2018 10:57 07/21/2018 10:53 03/27/2019 09:02 6/1 04/08/20  FVC-Pre Latest Units: L 2.13 2.16 2.14 1.93 1.99 1.81 2.18 1.79 1.90 1.83L  FVC-%Pred-Pre Latest Units: % 68 71 71 65 75 68 77 64  66$   Results for KHANIYA, TENAGLIA (MRN [id number]) as of 03/27/2019 09:43  Ref. Range 07/17/2013 09:53 02/07/2016 13:09 01/11/2017 11:05 01/10/2018 10:13 05/17/2018 14:24 06/07/2018 10:57 07/21/2018 10:53 03/27/2019 09:02 6/1 04/08/20  DLCO unc Latest Units: ml/min/mmHg 18.85 18.25 16.50 16.09 16.58 14.06  15.04 13.91 15.49  DLCO unc % pred Latest Units: % 69 67 61 59 70 59  77 71% 79%    OV 08/01/2020  Subjective:  Patient ID: Stacey [name], female , DOB: Aug 26, 1940 , age 84 y.o. , MRN: [id number] , ADDRESS: [place] [address] PCP Shon Baton, MD Patient Care Team: Shon Baton, MD as PCP - General (Internal Medicine) Buford Dresser, MD as PCP -  Cardiology (Cardiology)  This Provider for this visit: Treatment Team:  Attending Provider: Brand Males, MD   Rheumatoid arthritis with Joint s/ILD- Rituxan/steroids ([facility] [place] [facility])  - progressive phenotype (CT 2017/2019 -> Oct 2020). Planned Ofev start in Nov 2020 but did not start as of dec 2020. 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 Admitted 06/11/2019  - 06/16/2019. Rx with Remdesviri and Decadron.  Admitted August 2021 for respiratory infection not otherwise specified.  [place]  Small Hiatal hernia on CT Oct 2020  Rib fracture from fall Dec 2020: Musculoskeletal: There is nondisplaced fractures of the posterior left fifth, 6, 8, 9, and tenth left ribs. There is unchanged slight superior compression deformities of the T5, T6, T10 vertebral bodies with less than 25% loss in height.Associated pulmnary contusion +  -On Reclast once a year through Dr. JenEda Paschal 08/01/2020 -   Chief Complaint  Patient presents with  . Follow-up    Doing well     HPI MarSYANNA [name] 48o. -returns for follow-up.  She says since last visit she has lost weight.  In fact tracking her weight it appears she is definitely lost weight.  She has occasional intermittent nausea once every few weeks this is because she does not time her nintedanib well with food.  She has a low appetite as well.  She tried to go up on the Internet but she is unable to.   She takes a low-dose 100 mg twice daily.  She is on Rituxan and prednisone.  She is up-to-date with her COVID-vaccine.  Current shortness of breath standpoint she is stable.  Symptoms are stable.    SYMPTOM SCALE - ILD 03/27/2019  05/24/2019 (2-3 weeks pre cpovid)  08/03/2019 Post covid Post fall rib # 12/18/2019  04/10/2020 142# 08/01/2020 134#  O2 use ra       Shortness of Breath 0 -> 5 scale with 5 being worst (score 6 If unable to do)       At rest 0 _0 0  Simple tasks - showers, clothes change, eating, shaving 0 _1 Household (dishes, doing bed, laundry) _2 Shopping _3 Walking level at own pace _4 Walking up Stairs _5 Total (40 - 48) Dyspnea  Score _0 How bad is your cough? _1 0  How bad is your fatigue 3 3.5 5 3._2 nause    0 1 1  vomit    0 1 1  diarrhea    0 1 0  anxiety    0 2 0  depression    0 1 0     Simple office walk  feet x  3 laps goal with forehead probe 04/12/2018  07/21/2018  05/24/2019  12/18/2019  04/10/2020  08/01/2020   O2 used Room air - off o2 x 10 min Room air  Room air ra ra ra  Number laps completed 3 x 185 feet 3 x 250 feet    r  Comments about pace normal normal   Slow, no walker Slow . No walker.   Resting Pulse Ox/HR 99% and 72/min 97% and 74/min 97% and 81/min 97% and 76/m 96% and 88/min 94% and 83  Final Pulse Ox/HR 98% and 95/min 96% and 96/min 94% and 107/min 93% and 94 92% and 97 90% and 100  Desaturated </= 88% _3  yes  Desaturated <= 3% points no no Yes, 3 pints Ues, 4 points Yes,  4 points Yes, 4 points  Got Tachycardic >/= 90/min _4  yes  Symptoms at end of test No complaint Mild dyspnea Mod dyspnea Some dyspnea dyspnea dyspnie cat 3d  Miscellaneous comments improed from hospital Same v improved woprse same  Wobbly gait baseline     CT Chest data  No results found.    PFT  PFT Results Latest Ref Rng & Units 04/08/2020 12/12/2019  03/27/2019 07/21/2018 06/07/2018 05/17/2018 01/10/2018  FVC-Pre L 1.83 1.90 1.79 2.18 1.81 1.99 1.93  FVC-Predicted Pre % 66 68 64 77 68 75 65  FVC-Post L - - - - - - 1.99  FVC-Predicted Post % - - - - - - 67  Pre FEV1/FVC % % 74 78 79 79 69 75 77  Post FEV1/FCV % % - - - - - - 78  FEV1-Pre L 1.35 1.48 1.42 1.72 1.25 1.50 1.48  FEV1-Predicted Pre % 66 71 68 81 63 76 67  FEV1-Post L - - - - - - 1.55  DLCO uncorrected ml/min/mmHg 14.39 13.91 15.04 - 14.06 16.58 16.09  DLCO UNC% % 74 71 77 - 59 70 59  DLCO corrected ml/min/mmHg 15.49 13.91 - - - 17.64 -  DLCO COR %Predicted % 79 71 - - - 74 -  DLVA Predicted % 108 86 96 - 80 97 81  TLC L - - - - - - 4.40  TLC % Predicted % - - - - - - 82  RV % Predicted % - - - - - - 102       has a past medical history of Abnormal finding of blood chemistry, Asthma, H/O measles, H/O varicella, Hypertension, 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[place]), 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   reports that she has never smoked. She has never used smokeless tobacco.  Past Surgical History:  Procedure Laterality Date  . CHOLECYSTECTOMY  2011  . IR ANGIO INTRA EXTRACRAN SEL COM CAROTID INNOMINATE BILAT MOD SED  06/18/2020  . IR ANGIO VERTEBRAL SEL SUBCLAVIAN INNOMINATE UNI R MOD SED  06/18/2020  . IR ANGIO VERTEBRAL SEL VERTEBRAL  UNI L MOD SED  06/18/2020  . WISDOM TOOTH EXTRACTION      Allergies  Allergen Reactions  . Penicillins Other (See Comments)    Reaction unknown occurred during childhood Has patient had a PCN reaction causing immediate rash, facial/tongue/throat swelling, SOB or lightheadedness with hypotension: Unknown Has patient had a PCN reaction causing severe rash involving mucus membranes or skin necrosis: Unknown Has patient had a PCN reaction that required hospitalization: No Has  patient had a PCN reaction occurring within the last 10 years: No If all of the above answers are "NO", then may proceed with Cephalosporin use.  Unsur  . Remicade [Infliximab] Other (See Comments)    Reaction unknown  . Sulfa Antibiotics Other (See Comments)    Reaction unknown    Immunization History  Administered Date(s) Administered  . Fluad Quad(high Dose 65+) 04/23/2020  . Influenza Split 04/12/2013, 04/12/2014, 03/14/2015, 04/23/2016  . Influenza, High Dose Seasonal PF 04/12/2017, 03/24/2018, 04/13/2019  . Influenza-Unspecified 04/03/2013, 04/22/2017  . Moderna Sars-Covid-2 Vaccination 08/25/2019, 09/22/2019, 03/07/2020  . Pneumococcal Conjugate-13 04/03/2013, 09/28/2014  . Pneumococcal Polysaccharide-23 07/13/2012, 07/24/2013  . Td 01/11/2018  . Tdap 12/10/2017    Family History  Problem Relation Age of Onset  . Asthma Mother   . Anemia Mother   . Polymyalgia rheumatica Mother   . COPD Father   . Pulmonary fibrosis Father   . Breast cancer Maternal Grandmother 77     Current Outpatient Medications:  .  albuterol (VENTOLIN HFA) 108 (90 Base) MCG/ACT inhaler, Inhale 2 puffs into the lungs every 6 (six) hours as needed for wheezing or shortness of breath., Disp: , Rfl:  .  amitriptyline (ELAVIL) 25 MG tablet, Take 25 mg by mouth at bedtime., Disp: , Rfl: 0 .  benzonatate (TESSALON) 200 MG capsule, Take 1 capsule (200 mg total) by mouth 3 (three) times daily as needed for cough., Disp: 30 capsule, Rfl: 2 .  beta carotene w/minerals (OCUVITE) tablet, Take 1 tablet by mouth daily., Disp: , Rfl:  .  Biotin 1000 MCG tablet, Take 1,000 mcg by mouth daily., Disp: , Rfl:  .  cholecalciferol (VITAMIN D) 1000 UNITS tablet, Take 1,000 Units by mouth daily., Disp: , Rfl:  .  Estradiol 10 MCG TABS vaginal tablet, Place 10 mcg vaginally daily as needed (dryness). , Disp: , Rfl:  .  gabapentin (NEURONTIN) 300 MG capsule, Take 600 mg by mouth at bedtime., Disp: , Rfl:  .  lactulose  (CHRONULAC) 10 GM/15ML solution, Take 45 mLs (30 g total) by mouth 2 (two) times daily., Disp: 1892 mL, Rfl: 1 .  losartan (COZAAR) 25 MG tablet, Take 25 mg by mouth 2 (two) times daily., Disp: , Rfl:  .  losartan (COZAAR) 50 MG tablet, Take 1 tablet (50 mg total) by mouth daily., Disp: 30 tablet, Rfl: 1 .  melatonin 5 MG TABS, Take 1 tablet (5 mg total) by mouth at bedtime., Disp: 30 tablet, Rfl: 0 .  methylPREDNISolone (MEDROL) 4 MG tablet, Take 1 tablet (4 mg total) by mouth every other day. Alternate with the 12m dose (Patient taking differently: Take 4 mg by mouth daily.), Disp:  , Rfl:  .  mometasone-formoterol (DULERA) 100-5 MCG/ACT AERO, Inhale 2 puffs into the lungs 2 (two) times daily. (Patient taking differently: Inhale 2 puffs into the lungs daily.), Disp: 39 g, Rfl: 0 .  Nintedanib (OFEV) 100 MG CAPS, Take 1 capsule (100 mg total) by mouth 2 (two) times daily., Disp: 180 capsule, Rfl: 3 .  pantoprazole (PROTONIX) 40 MG tablet, Take 1 tablet (40 mg total) by mouth 2 (two) times daily before a meal., Disp: 180 tablet, Rfl: 3 .  polyethylene glycol (MIRALAX / GLYCOLAX) 17 g packet, Take 17 g by mouth 2 (two) times daily. (Patient taking differently: Take 17 g by mouth daily as needed for moderate constipation.), Disp: 72 each, Rfl: 1 .  pravastatin (PRAVACHOL) 20 MG tablet, Take 1 tablet (20 mg total) by mouth every evening., Disp: 90 tablet, Rfl: 3 .  riTUXimab (RITUXAN) 100 MG/10ML injection, Inject 100 mg into the vein every 6 (six) months. , Disp: , Rfl:  .  senna-docusate (SENOKOT-S) 8.6-50 MG tablet, Take 2 tablets by mouth 2 (two) times daily. (Patient taking differently: Take 1 tablet by mouth daily as needed for mild constipation.), Disp: 120 tablet, Rfl: 1 .  sulfamethoxazole-trimethoprim (BACTRIM DS) 800-160 MG tablet, Take 1 tablet by mouth 3 (three) times a week. Take on Mon, Wed, Fri (Patient taking differently: Take 1 tablet by mouth every Monday, Wednesday, and Friday.), Disp:  12 tablet, Rfl: 5 .  venlafaxine XR (EFFEXOR-XR) 75 MG 24 hr capsule, Take 75 mg by mouth at bedtime. , Disp: , Rfl:  .  ipratropium-albuterol (DUONEB) 0.5-2.5 (3) MG/3ML SOLN, Take 3 mLs by nebulization 3 (three) times daily for 5 days. (Patient taking differently: Take 3 mLs by nebulization every 4 (four) hours as needed (shortness of breath). ), Disp: 45 mL, Rfl: 1      Objective:   Vitals:   08/01/20 1349  BP: 118/64  Pulse: 83  Temp: (!) 97 F (36.1 C)  TempSrc: Oral  SpO2: 94%  Weight: 134 lb 6.4 oz (61 kg)  Height: _0  (1.676 m)    Estimated body mass index is 21.69 kg/m as calculated from the following:   Height as of this encounter: _1  (1.676 m).   Weight as of this encounter: 134 lb 6.4 oz (61 kg).  _2 @  Filed Weights   08/01/20 1349  Weight: 134 lb 6.4 oz (61 kg)     Physical Exam  General: No distress. Frail femal Neuro: Alert and Oriented x 3. GCS 15. Speech normal Psych: Pleasant Resp:  Barrel Chest - no.  Wheeze - no, Crackles - yes, No overt respiratory distress CVS: Normal heart sounds. Murmurs - no Ext: Stigmata of Connective Tissue Disease - RA + HEENT: Normal upper airway. PEERL +. No post nasal drip        Assessment:       ICD-10-CM   1. ILD (interstitial lung disease) ([place])  J84.9 SAR CoV2 Serology (COVID 19)AB(IGG)IA    SAR CoV2 Serology (COVID 19)AB(IGG)IA  2. Drug-induced weight loss  R63.4 SAR CoV2 Serology (COVID 19)AB(IGG)IA   T50.905A SAR CoV2 Serology (COVID 19)AB(IGG)IA  3. Encounter for long-term current use of high risk medication  Z79.899   4. Advice given about COVID-19 virus infection  Z71.89   5. 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    -Interstitial lung disease itself appears stable  Plan - Continue nintedanib 100 mg  twice daily with food -Continue Rituxan and steroids through the rheumatologist  Drug-induced weight loss  -This along with low weight and occasional intermittent nausea might be due to nintedanib  Plan - Take nintedanib with food -Increase your  protein intake through eggs or protein drinks -Close follow-up needed for monitoring of weight -If there is continued weight loss we might have to take you off nintedanib and monitor  Encounter for long-term current use of high risk medication Advice given about COVID-19 virus infection Vaccine counseling  -Because you are on Rituxan it is possible that you have not made antibody to the natural infection of COVID or to the vaccine  Plan - Check COVID IgG 08/01/2020 -> and if this is negative we will refer you to monoclonal antibody prophylactic injection   Follow-up -3 months do spirometry and DLCO -30-minute office visit in 3 months for face-to-face follow-up  -Symptom score and simple walk test at follow-up   ( Level 05 visit: Estb 40-54 min   in  visit type: on-site physical face to visit  in total care time and counseling or/and coordination of care by this undersigned MD - Dr Brand Males. This includes one or more of the following on this same day 08/01/2020: pre-charting, chart review, note writing, documentation discussion of test results, diagnostic or treatment recommendations, prognosis, risks and benefits of management options, instructions, education, compliance or risk-factor reduction. It excludes time spent by the [place] or office staff in the care of the patient. Actual time 12 min)    SIGNATURE    Dr. Brand Males, M.D., F.C.C.P,  Pulmonary and Critical Care Medicine Staff Physician, [place] Director - Interstitial Lung Disease  Program  Pulmonary [place] at [place], [place], [address]  Pager: [phone], If no answer or between  15:00h - 7:00h:  call [phone] Telephone: [phone]  2:27 PM 08/01/2020

## 2020-08-01 NOTE — Research (Signed)
Title: Chronic Fibrosing Interstitial Lung Disease with Progressive Phenotype Prospective Outcomes (ILD-PRO) Registry   Protocol #: IPF-PRO-SUB, Clinical Trials # [id number], Sponsor: [employer]  Objectives:  Marland Kitchen Describe current approaches to diagnosis and treatment of chronic fibrosing ILDs with progressive phenotype  . Describe the natural history of chronic fibrosing ILDs with progressive phenotype  . Assess quality of life from self-administered participant reported questionnaires for each disease group  . Describe participant interactions with the healthcare system, describe treatment practices across multiple institutions for each disease group  . Collect biological samples linked to well characterized chronic fibrosing ILDs with progressive phenotype to identify disease biomarkers  . Collect data and biological samples that will support future research studies.                                            Key Inclusion Criteria: Willing and able to provide informed consent  Age ? 30 years  Diagnosis of a non-IPF ILD of any duration, including, but not limited to Idiopathic Non-Specific Interstitial Pneumonia (INSIP), Unclassifiable Idiopathic Interstitial Pneumonias (IIPs), Interstitial Pneumonia with Autoimmune Features (IPAF), Autoimmune ILDs such as Rheumatoid Arthritis (RA-ILD) and Systemic Sclerosis (SSC-ILD), Chronic Hypersensitivity Pneumonitis (HP), Sarcoidosis or Exposure-related ILDs such as asbestosis.  Chronic fibrosing ILD defined by reticular abnormality with traction bronchiectasis with or without honeycombing confirmed by chest HRCT scan and/or lung biopsy.  Progressive phenotype as defined by fulfilling at least one of the criteria below of fibrotic changes (progression set point) within the last 24 months regardless of treatment considered appropriate in individual ILDs:  . decline in FVC % predicted (% pred) based on >10% relative decline   . decline in FVC % pred based on ? 5 - <10% relative decline in FVC combined with worsening of respiratory symptoms as assessed by the site investigator  . decline in FVC % pred based on ? 5 - <10% relative decline in FVC combined with increasing extent of fibrotic changes on chest imaging (HRCT scan) as assessed by the site investigator  . decline in DLCO % pred based on ? 10% relative decline  . worsening of respiratory symptoms as well as increasing extent of fibrotic changes on chest imaging (HRCT scan) as assessed by the site investigator independent of FVC change.    Key Exclusion Criteria: Malignancy, treated or untreated, other than skin or early stage prostate cancer, within the past 5 years  Currently listed for lung transplantation at the time of enrollment  Currently enrolled in a clinical trial at the time of enrollment in this registry    Clinical Research Coordinator / Research RN note : This visit for Subject Stacey [name] with DOB: [birth year] on 08/01/2020 for the above protocol is Visit/Encounter #2 and is for purpose of research. The consent for this encounter is under Protocol Version Amendment 4 (12Sep2019)  and is currently IRB approved. Subject expressed continued interest and consent in continuing as a study subject. Subject confirmed that there was no change in contact information (e.g. address, telephone, email). Subject thanked for participation in research and contribution to science.   During this visiton01/12/2020, the subject completed the blood work and questionnaires per the above referenced protocol. Please refer to the subject's paper source binder for further details.  Signed by  Lazaro Arms Clinical Research Coordinator / Nurse Horatio Pel, [place] 2:34 PM 08/01/2020

## 2020-08-01 NOTE — Patient Instructions (Addendum)
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    -Interstitial lung disease itself appears stable  Plan - Continue nintedanib 100 mg twice daily with food -Continue Rituxan and steroids through the rheumatologist  Drug-induced weight loss  -This along with low weight and occasional intermittent nausea might be due to nintedanib  Plan - Take nintedanib with food -Increase your protein intake through eggs or protein drinks -Close follow-up needed for monitoring of weight -If there is continued weight loss we might have to take you off nintedanib and monitor  Encounter for long-term current use of high risk medication Advice given about COVID-19 virus infection Vaccine counseling  -Because you are on Rituxan it is possible that you have not made antibody to the natural infection of COVID or to the vaccine  Plan - Check COVID IgG 08/01/2020 -> and if this is negative we will refer you to monoclonal antibody prophylactic injection   Follow-up -3 months do spirometry and DLCO -30-minute office visit in 3 months for face-to-face follow-up  -Symptom score and simple walk test at follow-up

## 2020-08-02 LAB — SARS-COV-2 ANTIBODY(IGG)SPIKE,SEMI-QUANTITATIVE: SARS COV1 AB(IGG)SPIKE,SEMI QN: <1 index (ref <1.00)

## 2020-08-05 ENCOUNTER — Telehealth: Payer: Self-pay | Admitting: Internal Medicine

## 2020-08-05 NOTE — Telephone Encounter (Signed)
She has made no covid IgG antibodies to her vaccine She is on Rituxan  Immunization History  Administered Date(s) Administered  . Fluad Quad(high Dose 65+) 04/23/2020  . Influenza Split 04/12/2013, 04/12/2014, 03/14/2015, 04/23/2016  . Influenza, High Dose Seasonal PF 04/12/2017, 03/24/2018, 04/13/2019  . Influenza-Unspecified 04/03/2013, 04/22/2017  . Moderna Sars-Covid-2 Vaccination 08/25/2019, 09/22/2019, 03/07/2020  . Pneumococcal Conjugate-13 04/03/2013, 09/28/2014  . Pneumococcal Polysaccharide-23 07/13/2012, 07/24/2013  . Td 01/11/2018  . Tdap 12/10/2017     Plan   She is highest risk for covid - is almost like she has not had vaccine though could also be her titers are waning  - refer for EVUSHELD - MAB injection - Rituxan patients are considered higher priority  - she needs to mask and sosical distance and be vigilant as always

## 2020-08-15 ENCOUNTER — Telehealth: Payer: Self-pay | Admitting: Internal Medicine

## 2020-08-15 DIAGNOSIS — J849 Interstitial pulmonary disease, unspecified: Secondary | ICD-10-CM

## 2020-08-15 DIAGNOSIS — J4540 Moderate persistent asthma, uncomplicated: Secondary | ICD-10-CM

## 2020-08-15 DIAGNOSIS — J1282 Pneumonia due to coronavirus disease 2019: Secondary | ICD-10-CM

## 2020-08-15 NOTE — Telephone Encounter (Signed)
Sorry for the delay.  She has no IgG against COVID.  Basically it is like she never got the vaccine.  She might have some memory cells against future Covid but given the Rituxan and lack of IgG antibody she is at high risk for getting severe Covid if she gets it again.  Therefore I agree hhe needs [place]  -Refer [facility] infusion program but also refer her to Dr. Horald Pollen in [place].  The phone number for Dr. Darron Doom is [phone].  Please do not refer her only to see [facility].  She needs to be referred to both places.  This is because there is a shortage in the drug and patient needs to be given choice.  I heard from a patient that Dr. Darron Doom currently has some samples and this patient should be able to get it as soon as possible

## 2020-08-15 NOTE — Telephone Encounter (Signed)
Patient is look for advice regarding her immunosuppression from rituxin and risk for covid. She had labs done with you. She states that her rheumatologist at [facility] said she could receive Evusheld.   Is this what you had talked to her about? Also could she get injection in [place]?

## 2020-08-15 NOTE — Telephone Encounter (Signed)
Called and spoke with patient to let her know of recs from Dr. Geoffery Spruce. She expressed understanding and told her that hopefully she would hear something in about 24 hours. Nothing further needed at this time.

## 2020-08-16 ENCOUNTER — Other Ambulatory Visit: Payer: Self-pay | Admitting: Infectious Diseases

## 2020-08-16 DIAGNOSIS — Z9189 Other specified personal risk factors, not elsewhere classified: Secondary | ICD-10-CM

## 2020-08-16 NOTE — Progress Notes (Signed)
Referral received for covid treatment -   Upon review of chart she is actually in need of Evusheld. Will place correct referral.    Janene Madeira, MSN, NP-C [facility] [email or web address] Pager: [phone] Office: [phone] [place]: [phone]

## 2020-08-19 ENCOUNTER — Telehealth: Payer: Self-pay | Admitting: Physician Assistant

## 2020-08-19 DIAGNOSIS — M0579 Rheumatoid arthritis with rheumatoid factor of multiple sites without organ or systems involvement: Secondary | ICD-10-CM | POA: Diagnosis not present

## 2020-08-19 NOTE — Telephone Encounter (Signed)
Called pt about Evusheld (tixagevimab co-packaged with cilgavimab) for pre-exposure prophylaxis for prevention of coronavirus disease 2019 (COVID-19) caused by the SARS-CoV-2 virus. The patient is a candidate for this therapy given increased risk for severe disease caused by immunosuppression.    Pt actually received the Evusheld at [facility] this morning. She would prefer to get the infusions here if/when they recommend repeat dosing. I have given her our hotline number to call closer to that time. [phone].  Angelena Form PA-C  [facility]

## 2020-08-22 ENCOUNTER — Encounter (HOSPITAL_COMMUNITY): Payer: Self-pay | Admitting: *Deleted

## 2020-08-22 NOTE — Progress Notes (Signed)
Called pt to reminder her of pulmonary rehab orientation appointment time. We discussed mask, directions to the department,proper shoes and our phone number if she would need to contact us.She voices understanding.

## 2020-08-23 ENCOUNTER — Encounter (HOSPITAL_COMMUNITY)
Admission: RE | Admit: 2020-08-23 | Discharge: 2020-08-23 | Disposition: A | Discharge status: Home / Self Care | Payer: Medicare Other | Source: Ambulatory Visit | Attending: Internal Medicine | Admitting: Internal Medicine

## 2020-08-23 ENCOUNTER — Other Ambulatory Visit: Payer: Self-pay

## 2020-08-23 ENCOUNTER — Encounter (HOSPITAL_COMMUNITY): Payer: Self-pay

## 2020-08-23 VITALS — BP 120/68 | HR 79 | Resp 20 | Ht 65.25 in | Wt 136.2 lb

## 2020-08-23 DIAGNOSIS — J849 Interstitial pulmonary disease, unspecified: Secondary | ICD-10-CM | POA: Diagnosis not present

## 2020-08-23 NOTE — Progress Notes (Signed)
Pulmonary Individual Treatment Plan  Patient Details  Name: Stacey [name] MRN: [id number] Date of Birth: 18-Feb-1941 Referring Provider:   April Manson Pulmonary Rehab Walk Test from 08/23/2020 in [facility]  Referring Provider Brand Males, MD      Initial Encounter Date:  Flowsheet Row Pulmonary Rehab Walk Test from 08/23/2020 in [facility]  Date 08/23/20      Visit Diagnosis: ILD (interstitial lung disease) ([place])  Patient's Home Medications on Admission:   Current Outpatient Medications:  .  albuterol (VENTOLIN HFA) 108 (90 Base) MCG/ACT inhaler, Inhale 2 puffs into the lungs every 6 (six) hours as needed for wheezing or shortness of breath., Disp: , Rfl:  .  amitriptyline (ELAVIL) 25 MG tablet, Take 25 mg by mouth at bedtime., Disp: , Rfl: 0 .  benzonatate (TESSALON) 200 MG capsule, Take 1 capsule (200 mg total) by mouth 3 (three) times daily as needed for cough., Disp: 30 capsule, Rfl: 2 .  beta carotene w/minerals (OCUVITE) tablet, Take 1 tablet by mouth daily., Disp: , Rfl:  .  Biotin 1000 MCG tablet, Take 1,000 mcg by mouth daily., Disp: , Rfl:  .  cholecalciferol (VITAMIN D) 1000 UNITS tablet, Take 1,000 Units by mouth daily., Disp: , Rfl:  .  Estradiol 10 MCG TABS vaginal tablet, Place 10 mcg vaginally daily as needed (dryness). , Disp: , Rfl:  .  gabapentin (NEURONTIN) 300 MG capsule, Take 600 mg by mouth at bedtime., Disp: , Rfl:  .  lactulose (CHRONULAC) 10 GM/15ML solution, Take 45 mLs (30 g total) by mouth 2 (two) times daily., Disp: 1892 mL, Rfl: 1 .  losartan (COZAAR) 50 MG tablet, Take 1 tablet (50 mg total) by mouth daily., Disp: 30 tablet, Rfl: 1 .  melatonin 5 MG TABS, Take 1 tablet (5 mg total) by mouth at bedtime., Disp: 30 tablet, Rfl: 0 .  methylPREDNISolone (MEDROL) 4 MG tablet, Take 1 tablet (4 mg total) by mouth every other day. Alternate with the 2mg  dose (Patient taking differently:  Take 4 mg by mouth daily.), Disp:  , Rfl:  .  mometasone-formoterol (DULERA) 100-5 MCG/ACT AERO, Inhale 2 puffs into the lungs 2 (two) times daily. (Patient taking differently: Inhale 2 puffs into the lungs daily.), Disp: 39 g, Rfl: 0 .  Nintedanib (OFEV) 100 MG CAPS, Take 1 capsule (100 mg total) by mouth 2 (two) times daily., Disp: 180 capsule, Rfl: 3 .  pantoprazole (PROTONIX) 40 MG tablet, Take 1 tablet (40 mg total) by mouth 2 (two) times daily before a meal., Disp: 180 tablet, Rfl: 3 .  polyethylene glycol (MIRALAX / GLYCOLAX) 17 g packet, Take 17 g by mouth 2 (two) times daily. (Patient taking differently: Take 17 g by mouth daily as needed for moderate constipation.), Disp: 72 each, Rfl: 1 .  pravastatin (PRAVACHOL) 20 MG tablet, Take 1 tablet (20 mg total) by mouth every evening., Disp: 90 tablet, Rfl: 3 .  riTUXimab (RITUXAN) 100 MG/10ML injection, Inject 100 mg into the vein every 6 (six) months. , Disp: , Rfl:  .  senna-docusate (SENOKOT-S) 8.6-50 MG tablet, Take 2 tablets by mouth 2 (two) times daily. (Patient taking differently: Take 1 tablet by mouth daily as needed for mild constipation.), Disp: 120 tablet, Rfl: 1 .  sulfamethoxazole-trimethoprim (BACTRIM DS) 800-160 MG tablet, Take 1 tablet by mouth 3 (three) times a week. Take on Mon, Wed, Fri (Patient taking differently: Take 1 tablet by mouth every Monday, Wednesday,  and Friday.), Disp: 12 tablet, Rfl: 5 .  venlafaxine XR (EFFEXOR-XR) 75 MG 24 hr capsule, Take 75 mg by mouth at bedtime. , Disp: , Rfl:  .  ipratropium-albuterol (DUONEB) 0.5-2.5 (3) MG/3ML SOLN, Take 3 mLs by nebulization 3 (three) times daily for 5 days. (Patient taking differently: Take 3 mLs by nebulization every 4 (four) hours as needed (shortness of breath). ), Disp: 45 mL, Rfl: 1  Past Medical History: Past Medical History:  Diagnosis Date  . Abnormal finding of blood chemistry   . Asthma   . H/O measles   . H/O varicella   . Hypertension   . Interstitial  lung disease ([place])   . 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 Lichen sclerosus 14/97/02   Asymptomatic  . Low iron   . Mitral valve prolapse   . Osteoarthritis   . Osteoporosis   . Pneumonia   . Post herpetic neuralgia   . Rheumatoid arthritis(714.0)   . Yeast infection     Tobacco Use: Social History   Tobacco Use  Smoking Status Never Smoker  Smokeless Tobacco Never Used    Labs: Recent Review Flowsheet Data    Labs for ITP Cardiac and Pulmonary Rehab Latest Ref Rng & Units 08/20/2015 12/18/2018 12/19/2018 06/11/2019 02/09/2020   Cholestrol 0 - 200 mg/dL - - 222(H) - -   LDLCALC 0 - 99 mg/dL - - 137(H) - -   HDL >40 mg/dL - - 59 - -   Trlycerides <150 mg/dL - - 129 79 -   Hemoglobin A1c 4.8 - 5.6 % 5.9(H) - - - -   PHART 7.350 - 7.450 - 7.420 - - 7.400   PCO2ART 32.0 - 48.0 mmHg - 46.1 - - 43.3   HCO3 20.0 - 28.0 mmol/L - 29.3(H) - - 26.3   TCO2 0 - 100 mmol/L - - - - -   O2SAT % - 95.4 - - 93.4      Capillary Blood Glucose: Lab Results  Component Value Date   GLUCAP 75 03/29/2018   GLUCAP 109 (H) 03/28/2018   GLUCAP 130 (H) 03/27/2018   GLUCAP 121 (H) 03/26/2018   GLUCAP 123 (H) 03/25/2018     Pulmonary Assessment Scores:  Pulmonary Assessment Scores    Row Name 08/23/20 1143         ADL UCSD   SOB Score total 62           CAT Score   CAT Score pre 24           mMRC Score   mMRC Score 3           UCSD: Self-administered rating of dyspnea associated with activities of daily living (ADLs) 6-point scale (0 = "not at all" to 5 = "maximal or unable to do because of breathlessness")  Scoring Scores range from 0 to 120.  Minimally important difference is 5 units  CAT: CAT can identify the health impairment of COPD patients and is better correlated with disease progression.  CAT has a scoring range of zero to 40. The CAT score is classified into four groups of low (less than 10), medium (10 - 20), high (21-30) and very high (31-40) based on the impact level  of disease on health status. A CAT score over 10 suggests significant symptoms.  A worsening CAT score could be explained by an exacerbation, poor medication adherence, poor inhaler technique, or progression of COPD or comorbid conditions.  CAT MCID is 2 points  mMRC:  mMRC (Modified Medical Research Council) Dyspnea Scale is used to assess the degree of baseline functional disability in patients of respiratory disease due to dyspnea. No minimal important difference is established. A decrease in score of 1 point or greater is considered a positive change.   Pulmonary Function Assessment:   Exercise Target Goals: Exercise Program Goal: Individual exercise prescription set using results from initial 6 min walk test and THRR while considering  patient's activity barriers and safety.   Exercise Prescription Goal: Initial exercise prescription builds to 30-45 minutes a day of aerobic activity, 2-3 days per week.  Home exercise guidelines will be given to patient during program as part of exercise prescription that the participant will acknowledge.  Activity Barriers & Risk Stratification:  Activity Barriers & Cardiac Risk Stratification - 08/23/20 1129      Activity Barriers & Cardiac Risk Stratification   Activity Barriers Balance Concerns;Arthritis;Deconditioning;Assistive Device;Shortness of Breath;Other (comment)    Comments Pt has RA.  Pt has a rolater walker that she seldoms uses.  Advised due to balance issues she must use an assistive device while ambulating for safety.  Pt agreed but was reluctant.    Cardiac Risk Stratification Moderate           6 Minute Walk:  6 Minute Walk    Row Name 08/23/20 1020         6 Minute Walk   Phase Initial     Distance 765 feet     Walk Time 6 minutes     # of Rest Breaks 2  2:00 -2:39 & 4:50-6:00 (total 1:49)     MPH 1.4     METS 1.8     RPE 12     Perceived Dyspnea  4     VO2 Peak 6.3     Symptoms Yes (comment)     Comments SOB/RPD  = 4. No pain. Pt has unsteady gait at times.     Resting HR 71 bpm     Resting BP 120/68     Resting Oxygen Saturation  95 %     Exercise Oxygen Saturation  during 6 min walk 92 %     Max Ex. HR 104 bpm     Max Ex. BP 142/80     2 Minute Post BP 134/78           Interval HR   1 Minute HR 85     2 Minute HR 93     3 Minute HR 92     4 Minute HR 99     5 Minute HR 104     6 Minute HR 91     2 Minute Post HR 83     Interval Heart Rate? Yes           Interval Oxygen   Interval Oxygen? Yes     Baseline Oxygen Saturation % 95 %     1 Minute Oxygen Saturation % 90 %     1 Minute Liters of Oxygen 0 L     2 Minute Oxygen Saturation % 89 %     2 Minute Liters of Oxygen 0 L     3 Minute Oxygen Saturation % 94 %     3 Minute Liters of Oxygen 0 L     4 Minute Oxygen Saturation % 94 %     4 Minute Liters of Oxygen 0 L     5 Minute Oxygen Saturation % 87 %  5 Minute Liters of Oxygen 0 L     6 Minute Oxygen Saturation % 92 %     6 Minute Liters of Oxygen 0 L     2 Minute Post Oxygen Saturation % 98 %     2 Minute Post Liters of Oxygen 0 L            Oxygen Initial Assessment:  Oxygen Initial Assessment - 08/23/20 1154      Home Oxygen   Home Oxygen Device --   [employer]          Oxygen Re-Evaluation:   Oxygen Discharge (Final Oxygen Re-Evaluation):   Initial Exercise Prescription:  Initial Exercise Prescription - 08/23/20 1200      Date of Initial Exercise RX and Referring Provider   Date 08/23/20    Referring Provider Brand Males, MD    Expected Discharge Date 10/24/20      NuStep   Level 1    SPM 75    Minutes 15    METs 1.8      Track   Laps 8    Minutes 15    METs 1.93      Prescription Details   Frequency (times per week) 2    Duration Progress to 30 minutes of continuous aerobic without signs/symptoms of physical distress      Intensity   THRR 40-80% of Max Heartrate 56-112    Ratings of Perceived Exertion 11-13    Perceived  Dyspnea 0-4      Progression   Progression Continue progressive overload as per policy without signs/symptoms or physical distress.      Resistance Training   Training Prescription Yes    Weight Orange Bands    Reps 10-15           Perform Capillary Blood Glucose checks as needed.  Exercise Prescription Changes:   Exercise Comments:   Exercise Goals and Review:  Exercise Goals    Row Name 08/23/20 1215             Exercise Goals   Increase Physical Activity Yes       Intervention Provide advice, education, support and counseling about physical activity/exercise needs.;Develop an individualized exercise prescription for aerobic and resistive training based on initial evaluation findings, risk stratification, comorbidities and participant's personal goals.       Expected Outcomes Short Term: Attend rehab on a regular basis to increase amount of physical activity.;Long Term: Add in home exercise to make exercise part of routine and to increase amount of physical activity.;Long Term: Exercising regularly at least 3-5 days a week.       Increase Strength and Stamina Yes       Intervention Provide advice, education, support and counseling about physical activity/exercise needs.;Develop an individualized exercise prescription for aerobic and resistive training based on initial evaluation findings, risk stratification, comorbidities and participant's personal goals.       Expected Outcomes Short Term: Increase workloads from initial exercise prescription for resistance, speed, and METs.;Short Term: Perform resistance training exercises routinely during rehab and add in resistance training at home;Long Term: Improve cardiorespiratory fitness, muscular endurance and strength as measured by increased METs and functional capacity (6MWT)       Able to understand and use rate of perceived exertion (RPE) scale Yes       Intervention Provide education and explanation on how to use RPE scale        Expected Outcomes Short Term: Able to use RPE  daily in rehab to express subjective intensity level;Long Term:  Able to use RPE to guide intensity level when exercising independently       Able to understand and use Dyspnea scale Yes       Intervention Provide education and explanation on how to use Dyspnea scale       Expected Outcomes Short Term: Able to use Dyspnea scale daily in rehab to express subjective sense of shortness of breath during exertion;Long Term: Able to use Dyspnea scale to guide intensity level when exercising independently       Knowledge and understanding of Target Heart Rate Range (THRR) Yes       Intervention Provide education and explanation of THRR including how the numbers were predicted and where they are located for reference       Expected Outcomes Short Term: Able to state/look up THRR;Long Term: Able to use THRR to govern intensity when exercising independently;Short Term: Able to use daily as guideline for intensity in rehab       Understanding of Exercise Prescription Yes       Intervention Provide education, explanation, and written materials on patient's individual exercise prescription       Expected Outcomes Short Term: Able to explain program exercise prescription;Long Term: Able to explain home exercise prescription to exercise independently              Exercise Goals Re-Evaluation :   Discharge Exercise Prescription (Final Exercise Prescription Changes):   Nutrition:  Target Goals: Understanding of nutrition guidelines, daily intake of sodium 1500mg , cholesterol 200mg , calories 30% from fat and 7% or less from saturated fats, daily to have 5 or more servings of fruits and vegetables.  Biometrics:  Pre Biometrics - 08/23/20 1125      Pre Biometrics   Height 5' 5.25" (1.657 m)    Weight 61.8 kg    BMI (Calculated) 22.51    Grip Strength 22 kg            Nutrition Therapy Plan and Nutrition Goals:   Nutrition  Assessments:  MEDIFICTS Score Key:  ?70 Need to make dietary changes   40-70 Heart Healthy Diet  ? 40 Therapeutic Level Cholesterol Diet   Picture Your Plate Scores:  <03 Unhealthy dietary pattern with much room for improvement.  41-50 Dietary pattern unlikely to meet recommendations for good health and room for improvement.  51-60 More healthful dietary pattern, with some room for improvement.   >60 Healthy dietary pattern, although there may be some specific behaviors that could be improved.    Nutrition Goals Re-Evaluation:   Nutrition Goals Discharge (Final Nutrition Goals Re-Evaluation):   Psychosocial: Target Goals: Acknowledge presence or absence of significant depression and/or stress, maximize coping skills, provide positive support system. Participant is able to verbalize types and ability to use techniques and skills needed for reducing stress and depression.  Initial Review & Psychosocial Screening:  Initial Psych Review & Screening - 08/23/20 0933      Initial Review   Current issues with None Identified      Family Dynamics   Good Support System? 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      Screening Interventions   Interventions Encouraged to exercise;Provide feedback about the scores  to participant           Quality of Life Scores:  Scores of 19 and below usually indicate a poorer quality of life in these areas.  A difference of  2-3 points is a clinically meaningful difference.  A difference of 2-3 points in the total score of the Quality of Life Index has been associated with significant improvement in overall quality of life, self-image, physical symptoms, and general health in studies assessing change in quality  of life.  PHQ-9: Recent Review Flowsheet Data    Depression screen [facility] 2/9 08/23/2020 08/23/2020 02/22/2020   Decreased Interest 0 0 0   Down, Depressed, Hopeless 0 0 0   PHQ - 2 Score 0 0 0   Altered sleeping 0 - -   Tired, decreased energy 1 - -   Change in appetite 0 - -   Feeling bad or failure about yourself  0 - -   Trouble concentrating 0 - -   Moving slowly or fidgety/restless 0 - -   Suicidal thoughts 0 - -   PHQ-9 Score 1 - -   Difficult doing work/chores Not difficult at all - -     Interpretation of Total Score  Total Score Depression Severity:  1-4 = Minimal depression, 5-9 = Mild depression, 10-14 = Moderate depression, 15-19 = Moderately severe depression, 20-27 = Severe depression   Psychosocial Evaluation and Intervention:  Psychosocial Evaluation - 08/23/20 0936      Psychosocial Evaluation & Interventions   Interventions Encouraged to exercise with the program and follow exercise prescription    Comments Gustava does admit that when she had shingles years ago this caused her to feel anxious due to the unpredictible painful episodes she would have.  Pt takes medications and she feels this is working for her           Psychosocial Re-Evaluation:   Psychosocial Discharge (Final Psychosocial Re-Evaluation):   Education: Education Goals: Education classes will be provided on a weekly basis, covering required topics. Participant will state understanding/return demonstration of topics presented.  Learning Barriers/Preferences:  Learning Barriers/Preferences - 08/23/20 8527      Learning Barriers/Preferences   Learning Barriers Sight;Hearing;Reading    Learning Preferences Group Instruction;Individual Instruction;Verbal Instruction;Computer/Internet;Written Material;Video;Audio           Education Topics: Risk Factor Reduction:  -Group instruction that is supported by a PowerPoint presentation. Instructor discusses the definition of a risk factor,  different risk factors for pulmonary disease, and how the heart and lungs work together.     Nutrition for Pulmonary Patient:  -Group instruction provided by PowerPoint slides, verbal discussion, and written materials to support subject matter. The instructor gives an explanation and review of healthy diet recommendations, which includes a discussion on weight management, recommendations for fruit and vegetable consumption, as well as protein, fluid, caffeine, fiber, sodium, sugar, and alcohol. Tips for eating when patients are short of breath are discussed.   Pursed Lip Breathing:  -Group instruction that is supported by demonstration and informational handouts. Instructor discusses the benefits of pursed lip and diaphragmatic breathing and detailed demonstration on how to preform both.     Oxygen Safety:  -Group instruction provided by PowerPoint, verbal discussion, and written material to support subject matter. There is an overview of "What is Oxygen" and "Why do we need it".  Instructor also reviews how to create a safe environment for oxygen use, the importance of using oxygen as prescribed, and the risks of noncompliance. There  is a brief discussion on traveling with oxygen and resources the patient may utilize.   Oxygen Equipment:  -Group instruction provided by [facility] Staff utilizing handouts, written materials, and equipment demonstrations.   Signs and Symptoms:  -Group instruction provided by written material and verbal discussion to support subject matter. Warning signs and symptoms of infection, stroke, and heart attack are reviewed and when to call the physician/911 reinforced. Tips for preventing the spread of infection discussed.   Advanced Directives:  -Group instruction provided by verbal instruction and written material to support subject matter. Instructor reviews Advanced Directive laws and proper instruction for filling out document.   Pulmonary Video:  -Group  video education that reviews the importance of medication and oxygen compliance, exercise, good nutrition, pulmonary hygiene, and pursed lip and diaphragmatic breathing for the pulmonary patient.   Exercise for the Pulmonary Patient:  -Group instruction that is supported by a PowerPoint presentation. Instructor discusses benefits of exercise, core components of exercise, frequency, duration, and intensity of an exercise routine, importance of utilizing pulse oximetry during exercise, safety while exercising, and options of places to exercise outside of rehab.     Pulmonary Medications:  -Verbally interactive group education provided by instructor with focus on inhaled medications and proper administration.   Anatomy and Physiology of the Respiratory System and Intimacy:  -Group instruction provided by PowerPoint, verbal discussion, and written material to support subject matter. Instructor reviews respiratory cycle and anatomical components of the respiratory system and their functions. Instructor also reviews differences in obstructive and restrictive respiratory diseases with examples of each. Intimacy, Sex, and Sexuality differences are reviewed with a discussion on how relationships can change when diagnosed with pulmonary disease. Common sexual concerns are reviewed.   MD DAY -A group question and answer session with a medical doctor that allows participants to ask questions that relate to their pulmonary disease state.   OTHER EDUCATION -Group or individual verbal, written, or video instructions that support the educational goals of the pulmonary rehab program.   Holiday Eating Survival Tips:  -Group instruction provided by PowerPoint slides, verbal discussion, and written materials to support subject matter. The instructor gives patients tips, tricks, and techniques to help them not only survive but enjoy the holidays despite the onslaught of food that accompanies the  holidays.   Knowledge Questionnaire Score:  Knowledge Questionnaire Score - 08/23/20 1131      Knowledge Questionnaire Score   Pre Score 3/18           Core Components/Risk Factors/Patient Goals at Admission:  Personal Goals and Risk Factors at Admission - 08/23/20 0939      Core Components/Risk Factors/Patient Goals on Admission    Weight Management Weight Maintenance    Improve shortness of breath with ADL's Yes    Intervention Provide education, individualized exercise plan and daily activity instruction to help decrease symptoms of SOB with activities of daily living.    Expected Outcomes Short Term: Improve cardiorespiratory fitness to achieve a reduction of symptoms when performing ADLs;Long Term: Be able to perform more ADLs without symptoms or delay the onset of symptoms    Hypertension Yes    Intervention Provide education on lifestyle modifcations including regular physical activity/exercise, weight management, moderate sodium restriction and increased consumption of fresh fruit, vegetables, and low fat dairy, alcohol moderation, and smoking cessation.;Monitor prescription use compliance.    Intervention Provide education and support for participant on nutrition & aerobic/resistive exercise along with prescribed medications to achieve LDL 70mg , HDL >40mg .  Expected Outcomes Short Term: Participant states understanding of desired cholesterol values and is compliant with medications prescribed. Participant is following exercise prescription and nutrition guidelines.;Long Term: Cholesterol controlled with medications as prescribed, with individualized exercise RX and with personalized nutrition plan. Value goals: LDL < 70mg , HDL > 40 mg.           Core Components/Risk Factors/Patient Goals Review:    Core Components/Risk Factors/Patient Goals at Discharge (Final Review):    ITP Comments:   Comments:  Nekisha is looking forward to participating in pulmonary rehab.  Cherre Huger, BSN Cardiac and Training and development officer

## 2020-08-23 NOTE — Progress Notes (Signed)
Tamala Fothergill 80 y.o. female Pulmonary Rehab Orientation Note Boyd who was referred to Pulmonary rehab by Dr. Chase Caller with the diagnosis of ILD arrived today in Cardiac and Pulmonary Rehab. She arrived ambulatory with unsteady gait. Pt admits to feeling a bit off balance with walking but has not fallen this year.  She does not carry portable oxygen. Pt has POC and home concentrator with 2lnc prn order.   [facility] is the provider for their MDE. Per pt, she uses oxygen rarely. Color good, skin warm and dry. Patient is oriented to time and place. Merl  medical history, psychosocial health, and medications reviewed. Psychosocial assessment reveals pt lives with their spouse. Pt is currently retired. Pt hobbies include hand sewing and embroidery . Virna  reports her stress level is non- existent everyday stress with no limitations. Areas of anxiety include Health due having shingles several years ago and will occasional have a painful flare.  Also pt has RA and ILD which are both chronic in nature.  Pt does not exhibit  signs of depression. Pt takes anti-depressants and anxiety medications to help with the shingles. Julyssa is hopeful for the future and is participating in a drug study for IDL.  Pt has supportive husband of many years.  PHQ2/9 score 0/1. Pt shows good  coping skills with positive outlook .  Will continue to monitor and evaluate progress toward psychosocial goal(s) of getting stronger so she can be around for her 6 grandchildren  3 live here in [place] and the other 3 are in [place]. Physical assessment reveals heart rate is normal with occasional skip beat particularly on deep inspiration, breath sounds moderate to severe expiratory wheezing heard diffusely throughout both lungs more so on the right, moderate to severe inspiratory wheezing heard diffusely throughout both lungs more on the right side, mild-moderate rhonchi heard diffusely throughout both lungs more on the right.  Grip strength equal, strong. Distal pulses palpable with no edema. Patient reports she does take medications as prescribed. Patient states she follows a Regular diet. The patient reports no specific efforts to gain or lose weight. Pt would like to maintain her weight.   Deane BMI is 22.5. Patient's weight will be monitored. Demonstration and practice of PLB using pulse oximeter. Pt familiar with the technique but does not always use it.  Pt has home pulse ox.  Patient able to return demonstration satisfactorily. Safety and hand hygiene in the exercise area reviewed with patient. Patient voices understanding of the information reviewed. Department expectations discussed with patient and achievable goals were set. The patient shows enthusiasm about attending the program and we look forward to working with this nice ladu. Tallie will  begin exercise on 2:15 in the 2:00 exercise class.  Pt aware to arrive 15 minutes prior to her appt time and bring her rolater.45 minutes was spent on a variety of activities such as assessment of the patient, obtaining baseline data including height, weight, BMI, and grip strength, verifying medical history, allergies, and current medications, and teaching patient strategies for performing tasks with less respiratory effort with emphasis on pursed lip breathing. Cherre Huger, BSN Cardiac and Training and development officer

## 2020-08-27 ENCOUNTER — Encounter (HOSPITAL_COMMUNITY)
Admission: RE | Admit: 2020-08-27 | Discharge: 2020-08-27 | Disposition: A | Discharge status: Home / Self Care | Payer: Medicare Other | Source: Ambulatory Visit | Attending: Internal Medicine | Admitting: Internal Medicine

## 2020-08-27 ENCOUNTER — Other Ambulatory Visit: Payer: Self-pay

## 2020-08-27 DIAGNOSIS — J849 Interstitial pulmonary disease, unspecified: Secondary | ICD-10-CM

## 2020-08-27 NOTE — Progress Notes (Signed)
Daily Session Note  Patient Details  Name: Stacey [name] MRN: [id number] Date of Birth: Jul 03, 1941 Referring Provider:   April Manson Pulmonary Rehab Walk Test from 08/23/2020 in [place]  Referring Provider Brand Males, MD      Encounter Date: 08/27/2020  Check In:  Session Check In - 08/27/20 1439      Check-In   Supervising physician immediately available to respond to emergencies [facility] immediately available    Physician(s) Dr. Tana Coast    Location [facility]    Staff Present Rosebud Poles, RN, BSN;Daden Mahany Ysidro Evert, RN;Jessica Hassell Done, MS, ACSM-CEP, Exercise Physiologist    Virtual Visit No    Medication changes reported     No    Fall or balance concerns reported    No    Tobacco Cessation No Change    Warm-up and Cool-down Performed on first and last piece of equipment    Resistance Training Performed Yes    VAD Patient? No    PAD/SET Patient? No      Pain Assessment   Currently in Pain? No/denies    Multiple Pain Sites No           Capillary Blood Glucose: No results found for this or any previous visit (from the past 24 hour(s)).    Social History   Tobacco Use  Smoking Status Never Smoker  Smokeless Tobacco Never Used    Goals Met:  Exercise tolerated well No report of cardiac concerns or symptoms Strength training completed today  Goals Unmet:  Not Applicable  Comments: Service time is from 1415 to 1518    Dr. Fransico Him is Medical Director for Cardiac Rehab at [facility].

## 2020-08-29 ENCOUNTER — Other Ambulatory Visit: Payer: Self-pay

## 2020-08-29 ENCOUNTER — Encounter (HOSPITAL_COMMUNITY)
Admission: RE | Admit: 2020-08-29 | Discharge: 2020-08-29 | Disposition: A | Discharge status: Home / Self Care | Payer: Medicare Other | Source: Ambulatory Visit | Attending: Internal Medicine | Admitting: Internal Medicine

## 2020-08-29 DIAGNOSIS — J849 Interstitial pulmonary disease, unspecified: Secondary | ICD-10-CM

## 2020-08-29 NOTE — Progress Notes (Signed)
Daily Session Note  Patient Details  Name: Stacey [name] MRN: [id number] Date of Birth: Mar 27, 1941 Referring Provider:   April Manson Pulmonary Rehab Walk Test from 08/23/2020 in [place]  Referring Provider Stacey Males, MD      Encounter Date: 08/29/2020  Check In:  Session Check In - 08/29/20 1413      Check-In   Supervising physician immediately available to respond to emergencies [facility] immediately available    Physician(s) Dr. Sloan Leiter    [facility]    Staff Present Rosebud Poles, RN, BSN;Lisa Ysidro Evert, RN;Jessica Hassell Done, MS, ACSM-CEP, Exercise Physiologist    Virtual Visit No    Medication changes reported     No    Fall or balance concerns reported    No    Tobacco Cessation No Change    Warm-up and Cool-down Performed on first and last piece of equipment    Resistance Training Performed Yes    VAD Patient? No    PAD/SET Patient? No      Pain Assessment   Currently in Pain? No/denies    Multiple Pain Sites No           Capillary Blood Glucose: No results found for this or any previous visit (from the past 24 hour(s)).    Social History   Tobacco Use  Smoking Status Never Smoker  Smokeless Tobacco Never Used    Goals Met:  Proper associated with RPD/PD & O2 Sat Exercise tolerated well Strength training completed today  Goals Unmet:  Not Applicable  Comments: Service time is from 9978 to 1532    Dr. Fransico Him is Medical Director for Cardiac Rehab at [facility]

## 2020-09-03 ENCOUNTER — Encounter (HOSPITAL_COMMUNITY)
Admission: RE | Admit: 2020-09-03 | Discharge: 2020-09-03 | Disposition: A | Discharge status: Home / Self Care | Payer: Medicare Other | Source: Ambulatory Visit | Attending: Internal Medicine | Admitting: Internal Medicine

## 2020-09-03 ENCOUNTER — Other Ambulatory Visit: Payer: Self-pay

## 2020-09-03 ENCOUNTER — Other Ambulatory Visit: Payer: Self-pay | Admitting: Internal Medicine

## 2020-09-03 VITALS — Wt 136.9 lb

## 2020-09-03 DIAGNOSIS — J849 Interstitial pulmonary disease, unspecified: Secondary | ICD-10-CM

## 2020-09-03 NOTE — Progress Notes (Signed)
Daily Session Note  Patient Details  Name: Stacey [name] MRN: [id number] Date of Birth: Mar 11, 1941 Referring Provider:   April Manson Pulmonary Rehab Walk Test from 08/23/2020 in [place]  Referring Provider Brand Males, MD      Encounter Date: 09/03/2020  Check In:  Session Check In - 09/03/20 1454      Check-In   Supervising physician immediately available to respond to emergencies [facility] immediately available    Physician(s) Dr. Sloan Leiter    [facility]    Staff Present Rosebud Poles, RN, BSN;Lisa Ysidro Evert, RN;Jessica Hassell Done, MS, ACSM-CEP, Exercise Physiologist    Virtual Visit No    Medication changes reported     No    Fall or balance concerns reported    No    Tobacco Cessation No Change    Warm-up and Cool-down Performed on first and last piece of equipment    Resistance Training Performed Yes    VAD Patient? No    PAD/SET Patient? No      Pain Assessment   Currently in Pain? No/denies    Pain Score 0-No pain    Multiple Pain Sites No           Capillary Blood Glucose: No results found for this or any previous visit (from the past 24 hour(s)).   Exercise Prescription Changes - 09/03/20 1500      Response to Exercise   Blood Pressure (Admit) 153/74    Blood Pressure (Exercise) 126/66    Blood Pressure (Exit) 124/64    Heart Rate (Admit) 87 bpm    Heart Rate (Exercise) 103 bpm    Heart Rate (Exit) 90 bpm    Oxygen Saturation (Admit) 97 %    Oxygen Saturation (Exercise) 93 %    Oxygen Saturation (Exit) 96 %    Rating of Perceived Exertion (Exercise) 13    Perceived Dyspnea (Exercise) 3    Duration Progress to 30 minutes of  aerobic without signs/symptoms of physical distress    Intensity --   40-80% HRR     Resistance Training   Training Prescription Yes    Weight Orange Bands    Reps 10-15    Time 10 Minutes      NuStep   Level 3    SPM 80    Minutes 15    METs 2.4       Track   Laps 11    Minutes 15           Social History   Tobacco Use  Smoking Status Never Smoker  Smokeless Tobacco Never Used    Goals Met:  Proper associated with RPD/PD & O2 Sat Exercise tolerated well Strength training completed today  Goals Unmet:  Not Applicable  Comments: Service time is from 1345 to 1515.    Dr. Fransico Him is Medical Director for Cardiac Rehab at [facility].

## 2020-09-03 NOTE — Progress Notes (Signed)
Tamala Fothergill 80 y.o. female Nutrition Note  Diagnosis: ILD  Past Medical History:  Diagnosis Date  . Abnormal finding of blood chemistry   . Asthma   . H/O measles   . H/O varicella   . Hypertension   . 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[place])   . 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 Lichen sclerosus 22/63/33   Asymptomatic  . Low iron   . Mitral valve prolapse   . Osteoarthritis   . Osteoporosis   . Pneumonia   . Post herpetic neuralgia   . Rheumatoid arthritis(714.0)   . Yeast infection      Medications reviewed.   Current Outpatient Medications:  .  albuterol (VENTOLIN HFA) 108 (90 Base) MCG/ACT inhaler, Inhale 2 puffs into the lungs every 6 (six) hours as needed for wheezing or shortness of breath., Disp: , Rfl:  .  amitriptyline (ELAVIL) 25 MG tablet, Take 25 mg by mouth at bedtime., Disp: , Rfl: 0 .  benzonatate (TESSALON) 200 MG capsule, Take 1 capsule (200 mg total) by mouth 3 (three) times daily as needed for cough., Disp: 30 capsule, Rfl: 2 .  beta carotene w/minerals (OCUVITE) tablet, Take 1 tablet by mouth daily., Disp: , Rfl:  .  Biotin 1000 MCG tablet, Take 1,000 mcg by mouth daily., Disp: , Rfl:  .  cholecalciferol (VITAMIN D) 1000 UNITS tablet, Take 1,000 Units by mouth daily., Disp: , Rfl:  .  Estradiol 10 MCG TABS vaginal tablet, Place 10 mcg vaginally daily as needed (dryness). , Disp: , Rfl:  .  gabapentin (NEURONTIN) 300 MG capsule, Take 600 mg by mouth at bedtime., Disp: , Rfl:  .  ipratropium-albuterol (DUONEB) 0.5-2.5 (3) MG/3ML SOLN, Take 3 mLs by nebulization 3 (three) times daily for 5 days. (Patient taking differently: Take 3 mLs by nebulization every 4 (four) hours as needed (shortness of breath). ), Disp: 45 mL, Rfl: 1 .  lactulose (CHRONULAC) 10 GM/15ML solution, Take 45 mLs (30 g total) by mouth 2 (two) times daily., Disp: 1892 mL, Rfl: 1 .  losartan (COZAAR) 50 MG tablet, Take 1 tablet (50 mg total) by mouth daily., Disp: 30 tablet, Rfl: 1 .   melatonin 5 MG TABS, Take 1 tablet (5 mg total) by mouth at bedtime., Disp: 30 tablet, Rfl: 0 .  methylPREDNISolone (MEDROL) 4 MG tablet, Take 1 tablet (4 mg total) by mouth every other day. Alternate with the 2mg  dose (Patient taking differently: Take 4 mg by mouth daily.), Disp:  , Rfl:  .  mometasone-formoterol (DULERA) 100-5 MCG/ACT AERO, Inhale 2 puffs into the lungs 2 (two) times daily. (Patient taking differently: Inhale 2 puffs into the lungs daily.), Disp: 39 g, Rfl: 0 .  OFEV 100 MG CAPS, TAKE 1 CAPSULE BY MOUTH 2 TIMES DAILY., Disp: 180 capsule, Rfl: 3 .  pantoprazole (PROTONIX) 40 MG tablet, Take 1 tablet (40 mg total) by mouth 2 (two) times daily before a meal., Disp: 180 tablet, Rfl: 3 .  polyethylene glycol (MIRALAX / GLYCOLAX) 17 g packet, Take 17 g by mouth 2 (two) times daily. (Patient taking differently: Take 17 g by mouth daily as needed for moderate constipation.), Disp: 72 each, Rfl: 1 .  pravastatin (PRAVACHOL) 20 MG tablet, Take 1 tablet (20 mg total) by mouth every evening., Disp: 90 tablet, Rfl: 3 .  riTUXimab (RITUXAN) 100 MG/10ML injection, Inject 100 mg into the vein every 6 (six) months. , Disp: , Rfl:  .  senna-docusate (SENOKOT-S) 8.6-50 MG tablet, Take 2  tablets by mouth 2 (two) times daily. (Patient taking differently: Take 1 tablet by mouth daily as needed for mild constipation.), Disp: 120 tablet, Rfl: 1 .  sulfamethoxazole-trimethoprim (BACTRIM DS) 800-160 MG tablet, Take 1 tablet by mouth 3 (three) times a week. Take on Mon, Wed, Fri (Patient taking differently: Take 1 tablet by mouth every Monday, Wednesday, and Friday.), Disp: 12 tablet, Rfl: 5 .  venlafaxine XR (EFFEXOR-XR) 75 MG 24 hr capsule, Take 75 mg by mouth at bedtime. , Disp: , Rfl:    Ht Readings from Last 1 Encounters:  08/23/20 5' 5.25" (1.657 m)     Wt Readings from Last 3 Encounters:  08/23/20 136 lb 3.9 oz (61.8 kg)  08/01/20 134 lb 6.4 oz (61 kg)  06/26/20 138 lb (62.6 kg)     There is no  height or weight on file to calculate BMI.   Social History   Tobacco Use  Smoking Status Never Smoker  Smokeless Tobacco Never Used     Lab Results  Component Value Date   CHOL 222 (H) 12/19/2018   Lab Results  Component Value Date   HDL 59 12/19/2018   Lab Results  Component Value Date   LDLCALC 137 (H) 12/19/2018   Lab Results  Component Value Date   TRIG 79 06/11/2019     Lab Results  Component Value Date   HGBA1C 5.9 (H) 08/20/2015     CBG (last 3)  No results for input(s): GLUCAP in the last 72 hours.   Nutrition Note  Spoke with pt. Nutrition Plan and Nutrition Survey goals reviewed with pt. Pt is following a Generally Healthy diet.  She has had some decreased ability to cook and grocery shop. Husband is supportive and helps with both.  She is taking OFEV. She is not experiencing N/V anymore. She lost some weight with OFEV but it has been maintained recently. Recommended taking OFEV with meal.  Her appetite is good. She is eating 3 meals per day. Diet recall reviewed. Breakfast: cereal with milk, 1/2 biscuit Lunch: Ensure and popcorn Dinner: meatloaf, green beans, jello, fruit, and 1/2 chx enchilada She drinks coconut water, water, coffee, orange juice/diet soda occasionally.     Pt expressed understanding of the information reviewed.    Nutrition Diagnosis ? Food-and nutrition-related knowledge deficit related to lack of exposure to information as related to diagnosis of: ? ILD Nutrition Intervention ? Pt's individual nutrition plan reviewed with pt. ? Benefits of adopting Healthy diet discussed when Picture Your Plate reviewed.   ? Continue client-centered nutrition education by RD, as part of interdisciplinary care.  Goal(s) ? Pt to build a healthy plate including vegetables, fruits, whole grains, and low-fat dairy products in a heart healthy meal plan.  Plan:    Will provide client-centered nutrition education as part of interdisciplinary  care  Monitor and evaluate progress toward nutrition goal with team.   Michaele Offer, MS, RDN, LDN

## 2020-09-05 ENCOUNTER — Encounter (HOSPITAL_COMMUNITY)
Admission: RE | Admit: 2020-09-05 | Discharge: 2020-09-05 | Disposition: A | Discharge status: Home / Self Care | Payer: Medicare Other | Source: Ambulatory Visit | Attending: Internal Medicine | Admitting: Internal Medicine

## 2020-09-05 ENCOUNTER — Other Ambulatory Visit: Payer: Self-pay

## 2020-09-05 DIAGNOSIS — J849 Interstitial pulmonary disease, unspecified: Secondary | ICD-10-CM

## 2020-09-05 NOTE — Progress Notes (Signed)
Daily Session Note  Patient Details  Name: LAELYNN BLIZZARD MRN: [id number] Date of Birth: Nov 05, 1940 Referring Provider:   April Manson Pulmonary Rehab Walk Test from 08/23/2020 in [place]  Referring Provider Brand Males, MD      Encounter Date: 09/05/2020  Check In:  Session Check In - 09/05/20 1414      Check-In   Supervising physician immediately available to respond to emergencies [facility] immediately available    Physician(s) Dr. Tyrell Antonio    Location [facility]    Staff Present Rosebud Poles, RN, BSN;Davi Kroon Ysidro Evert, RN;Jessica Hassell Done, MS, ACSM-CEP, Exercise Physiologist    Virtual Visit No    Medication changes reported     No    Fall or balance concerns reported    No    Tobacco Cessation No Change    Warm-up and Cool-down Performed on first and last piece of equipment    Resistance Training Performed Yes    VAD Patient? No    PAD/SET Patient? No      Pain Assessment   Currently in Pain? No/denies    Pain Score 0-No pain    Multiple Pain Sites No           Capillary Blood Glucose: No results found for this or any previous visit (from the past 24 hour(s)).    Social History   Tobacco Use  Smoking Status Never Smoker  Smokeless Tobacco Never Used    Goals Met:  Exercise tolerated well No report of cardiac concerns or symptoms Strength training completed today  Goals Unmet:  Not Applicable  Comments: Service time is from 1400 to 1515    Dr. Fransico Him is Medical Director for Cardiac Rehab at [facility].

## 2020-09-06 ENCOUNTER — Other Ambulatory Visit (HOSPITAL_COMMUNITY)
Admission: RE | Admit: 2020-09-06 | Discharge: 2020-09-06 | Disposition: A | Discharge status: Home / Self Care | Payer: Medicare Other | Source: Ambulatory Visit | Attending: Internal Medicine | Admitting: Internal Medicine

## 2020-09-06 DIAGNOSIS — Z01812 Encounter for preprocedural laboratory examination: Secondary | ICD-10-CM | POA: Insufficient documentation

## 2020-09-06 DIAGNOSIS — Z20822 Contact with and (suspected) exposure to covid-19: Secondary | ICD-10-CM | POA: Diagnosis not present

## 2020-09-06 LAB — SARS CORONAVIRUS 2 (TAT 6-24 HRS): SARS Coronavirus 2: NEGATIVE

## 2020-09-10 ENCOUNTER — Other Ambulatory Visit: Payer: Self-pay

## 2020-09-10 ENCOUNTER — Encounter (HOSPITAL_COMMUNITY)
Admission: RE | Admit: 2020-09-10 | Discharge: 2020-09-10 | Disposition: A | Discharge status: Home / Self Care | Payer: Medicare Other | Source: Ambulatory Visit | Attending: Internal Medicine | Admitting: Internal Medicine

## 2020-09-10 ENCOUNTER — Ambulatory Visit (INDEPENDENT_AMBULATORY_CARE_PROVIDER_SITE_OTHER): Payer: Medicare Other | Admitting: Internal Medicine

## 2020-09-10 DIAGNOSIS — J849 Interstitial pulmonary disease, unspecified: Secondary | ICD-10-CM

## 2020-09-10 LAB — PULMONARY FUNCTION TEST
DL/VA % pred: 102 %
DL/VA: 4.13 ml/min/mmHg/L
DLCO cor % pred: 79 %
DLCO cor: 15.38 ml/min/mmHg
DLCO unc % pred: 79 %
DLCO unc: 15.38 ml/min/mmHg
FEF 25-75 Pre: 1.03 L/sec
FEF2575-%Pred-Pre: 70 %
FEV1-%Pred-Pre: 64 %
FEV1-Pre: 1.32 L
FEV1FVC-%Pred-Pre: 101 %
FEV6-%Pred-Pre: 68 %
FEV6-Pre: 1.76 L
FEV6FVC-%Pred-Pre: 105 %
FVC-%Pred-Pre: 64 %
FVC-Pre: 1.76 L
Pre FEV1/FVC ratio: 75 %
Pre FEV6/FVC Ratio: 100 %

## 2020-09-10 NOTE — Progress Notes (Signed)
Pulmonary Individual Treatment Plan  Patient Details  Name: Stacey [name] MRN: [id number] Date of Birth: 01/30/1941 Referring Provider:   April Manson Pulmonary Rehab Walk Test from 08/23/2020 in [place]  Referring Provider Brand Males, MD      Initial Encounter Date:  Flowsheet Row Pulmonary Rehab Walk Test from 08/23/2020 in [facility]  Date 08/23/20      Visit Diagnosis: ILD (interstitial lung disease) ([place])  Patient's Home Medications on Admission:   Current Outpatient Medications:  .  albuterol (VENTOLIN HFA) 108 (90 Base) MCG/ACT inhaler, Inhale 2 puffs into the lungs every 6 (six) hours as needed for wheezing or shortness of breath., Disp: , Rfl:  .  amitriptyline (ELAVIL) 25 MG tablet, Take 25 mg by mouth at bedtime., Disp: , Rfl: 0 .  benzonatate (TESSALON) 200 MG capsule, Take 1 capsule (200 mg total) by mouth 3 (three) times daily as needed for cough., Disp: 30 capsule, Rfl: 2 .  beta carotene w/minerals (OCUVITE) tablet, Take 1 tablet by mouth daily., Disp: , Rfl:  .  Biotin 1000 MCG tablet, Take 1,000 mcg by mouth daily., Disp: , Rfl:  .  cholecalciferol (VITAMIN D) 1000 UNITS tablet, Take 1,000 Units by mouth daily., Disp: , Rfl:  .  Estradiol 10 MCG TABS vaginal tablet, Place 10 mcg vaginally daily as needed (dryness). , Disp: , Rfl:  .  gabapentin (NEURONTIN) 300 MG capsule, Take 600 mg by mouth at bedtime., Disp: , Rfl:  .  ipratropium-albuterol (DUONEB) 0.5-2.5 (3) MG/3ML SOLN, Take 3 mLs by nebulization 3 (three) times daily for 5 days. (Patient taking differently: Take 3 mLs by nebulization every 4 (four) hours as needed (shortness of breath). ), Disp: 45 mL, Rfl: 1 .  lactulose (CHRONULAC) 10 GM/15ML solution, Take 45 mLs (30 g total) by mouth 2 (two) times daily., Disp: 1892 mL, Rfl: 1 .  losartan (COZAAR) 50 MG tablet, Take 1 tablet (50 mg total) by mouth daily., Disp: 30 tablet, Rfl: 1 .   melatonin 5 MG TABS, Take 1 tablet (5 mg total) by mouth at bedtime., Disp: 30 tablet, Rfl: 0 .  methylPREDNISolone (MEDROL) 4 MG tablet, Take 1 tablet (4 mg total) by mouth every other day. Alternate with the 41m dose (Patient taking differently: Take 4 mg by mouth daily.), Disp:  , Rfl:  .  mometasone-formoterol (DULERA) 100-5 MCG/ACT AERO, Inhale 2 puffs into the lungs 2 (two) times daily. (Patient taking differently: Inhale 2 puffs into the lungs daily.), Disp: 39 g, Rfl: 0 .  OFEV 100 MG CAPS, TAKE 1 CAPSULE BY MOUTH 2 TIMES DAILY., Disp: 180 capsule, Rfl: 3 .  pantoprazole (PROTONIX) 40 MG tablet, Take 1 tablet (40 mg total) by mouth 2 (two) times daily before a meal., Disp: 180 tablet, Rfl: 3 .  polyethylene glycol (MIRALAX / GLYCOLAX) 17 g packet, Take 17 g by mouth 2 (two) times daily. (Patient taking differently: Take 17 g by mouth daily as needed for moderate constipation.), Disp: 72 each, Rfl: 1 .  pravastatin (PRAVACHOL) 20 MG tablet, Take 1 tablet (20 mg total) by mouth every evening., Disp: 90 tablet, Rfl: 3 .  riTUXimab (RITUXAN) 100 MG/10ML injection, Inject 100 mg into the vein every 6 (six) months. , Disp: , Rfl:  .  senna-docusate (SENOKOT-S) 8.6-50 MG tablet, Take 2 tablets by mouth 2 (two) times daily. (Patient taking differently: Take 1 tablet by mouth daily as needed for mild constipation.), Disp:  120 tablet, Rfl: 1 .  sulfamethoxazole-trimethoprim (BACTRIM DS) 800-160 MG tablet, Take 1 tablet by mouth 3 (three) times a week. Take on Mon, Wed, Fri (Patient taking differently: Take 1 tablet by mouth every Monday, Wednesday, and Friday.), Disp: 12 tablet, Rfl: 5 .  venlafaxine XR (EFFEXOR-XR) 75 MG 24 hr capsule, Take 75 mg by mouth at bedtime. , Disp: , Rfl:   Past Medical History: Past Medical History:  Diagnosis Date  . Abnormal finding of blood chemistry   . Asthma   . H/O measles   . H/O varicella   . Hypertension   . 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[place])   . 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 Lichen sclerosus 46/56/81   Asymptomatic  . Low iron   . Mitral valve prolapse   . Osteoarthritis   . Osteoporosis   . Pneumonia   . Post herpetic neuralgia   . Rheumatoid arthritis(714.0)   . Yeast infection     Tobacco Use: Social History   Tobacco Use  Smoking Status Never Smoker  Smokeless Tobacco Never Used    Labs: Recent Review Flowsheet Data    Labs for ITP Cardiac and Pulmonary Rehab Latest Ref Rng & Units 08/20/2015 12/18/2018 12/19/2018 06/11/2019 02/09/2020   Cholestrol 0 - 200 mg/dL - - 222(H) - -   LDLCALC 0 - 99 mg/dL - - 137(H) - -   HDL >40 mg/dL - - 59 - -   Trlycerides <150 mg/dL - - 129 79 -   Hemoglobin A1c 4.8 - 5.6 % 5.9(H) - - - -   PHART 7.350 - 7.450 - 7.420 - - 7.400   PCO2ART 32.0 - 48.0 mmHg - 46.1 - - 43.3   HCO3 20.0 - 28.0 mmol/L - 29.3(H) - - 26.3   TCO2 0 - 100 mmol/L - - - - -   O2SAT % - 95.4 - - 93.4      Capillary Blood Glucose: Lab Results  Component Value Date   GLUCAP 75 03/29/2018   GLUCAP 109 (H) 03/28/2018   GLUCAP 130 (H) 03/27/2018   GLUCAP 121 (H) 03/26/2018   GLUCAP 123 (H) 03/25/2018     Pulmonary Assessment Scores:  Pulmonary Assessment Scores    Row Name 08/23/20 1143         ADL UCSD   SOB Score total 62           CAT Score   CAT Score pre 24           mMRC Score   mMRC Score 3           UCSD: Self-administered rating of dyspnea associated with activities of daily living (ADLs) 6-point scale (0 = "not at all" to 5 = "maximal or unable to do because of breathlessness")  Scoring Scores range from 0 to 120.  Minimally important difference is 5 units  CAT: CAT can identify the health impairment of COPD patients and is better correlated with disease progression.  CAT has a scoring range of zero to 40. The CAT score is classified into four groups of low (less than 10), medium (10 - 20), high (21-30) and very high (31-40) based on the impact level of disease on health status. A CAT score  over 10 suggests significant symptoms.  A worsening CAT score could be explained by an exacerbation, poor medication adherence, poor inhaler technique, or progression of COPD or comorbid conditions.  CAT MCID is 2 points  mMRC: mMRC Public affairs consultant)  Dyspnea Scale is used to assess the degree of baseline functional disability in patients of respiratory disease due to dyspnea. No minimal important difference is established. A decrease in score of 1 point or greater is considered a positive change.   Pulmonary Function Assessment:   Exercise Target Goals: Exercise Program Goal: Individual exercise prescription set using results from initial 6 min walk test and THRR while considering  patient's activity barriers and safety.   Exercise Prescription Goal: Initial exercise prescription builds to 30-45 minutes a day of aerobic activity, 2-3 days per week.  Home exercise guidelines will be given to patient during program as part of exercise prescription that the participant will acknowledge.  Activity Barriers & Risk Stratification:  Activity Barriers & Cardiac Risk Stratification - 08/23/20 1129      Activity Barriers & Cardiac Risk Stratification   Activity Barriers Balance Concerns;Arthritis;Deconditioning;Assistive Device;Shortness of Breath;Other (comment)    Comments Pt has RA.  Pt has a rolater walker that she seldoms uses.  Advised due to balance issues she must use an assistive device while ambulating for safety.  Pt agreed but was reluctant.    Cardiac Risk Stratification Moderate           6 Minute Walk:  6 Minute Walk    Row Name 08/23/20 1020         6 Minute Walk   Phase Initial     Distance 765 feet     Walk Time 6 minutes     # of Rest Breaks 2  2:00 -2:39 & 4:50-6:00 (total 1:49)     MPH 1.4     METS 1.8     RPE 12     Perceived Dyspnea  4     VO2 Peak 6.3     Symptoms Yes (comment)     Comments SOB/RPD = 4. No pain. Pt has unsteady gait at  times.     Resting HR 71 bpm     Resting BP 120/68     Resting Oxygen Saturation  95 %     Exercise Oxygen Saturation  during 6 min walk 92 %     Max Ex. HR 104 bpm     Max Ex. BP 142/80     2 Minute Post BP 134/78           Interval HR   1 Minute HR 85     2 Minute HR 93     3 Minute HR 92     4 Minute HR 99     5 Minute HR 104     6 Minute HR 91     2 Minute Post HR 83     Interval Heart Rate? Yes           Interval Oxygen   Interval Oxygen? Yes     Baseline Oxygen Saturation % 95 %     1 Minute Oxygen Saturation % 90 %     1 Minute Liters of Oxygen 0 L     2 Minute Oxygen Saturation % 89 %     2 Minute Liters of Oxygen 0 L     3 Minute Oxygen Saturation % 94 %     3 Minute Liters of Oxygen 0 L     4 Minute Oxygen Saturation % 94 %     4 Minute Liters of Oxygen 0 L     5 Minute Oxygen Saturation % 87 %     5 Minute Liters  of Oxygen 0 L     6 Minute Oxygen Saturation % 92 %     6 Minute Liters of Oxygen 0 L     2 Minute Post Oxygen Saturation % 98 %     2 Minute Post Liters of Oxygen 0 L            Oxygen Initial Assessment:  Oxygen Initial Assessment - 08/23/20 1154      Home Oxygen   Home Oxygen Device --   [employer]          Oxygen Re-Evaluation:  Oxygen Re-Evaluation    Stacey [name] Name 09/09/20 1636             Program Oxygen Prescription   Program Oxygen Prescription None               Home Oxygen   Home Oxygen Device None       Home Exercise Oxygen Prescription None       Home Resting Oxygen Prescription None               Goals/Expected Outcomes   Short Term Goals To learn and exhibit compliance with exercise, home and travel O2 prescription;To learn and understand importance of maintaining oxygen saturations>88%;To learn and demonstrate proper use of respiratory medications;To learn and understand importance of monitoring SPO2 with pulse oximeter and demonstrate accurate use of the pulse oximeter.;To learn and demonstrate proper pursed lip  breathing techniques or other breathing techniques.       Long  Term Goals Exhibits compliance with exercise, home and travel O2 prescription;Verbalizes importance of monitoring SPO2 with pulse oximeter and return demonstration;Maintenance of O2 saturations>88%;Exhibits proper breathing techniques, such as pursed lip breathing or other method taught during program session;Compliance with respiratory medication;Demonstrates proper use of MDI's       Goals/Expected Outcomes compliance and understanding of oxygen saturation and pursed lip breathing              Oxygen Discharge (Final Oxygen Re-Evaluation):  Oxygen Re-Evaluation - 09/09/20 1636      Program Oxygen Prescription   Program Oxygen Prescription None      Home Oxygen   Home Oxygen Device None    Home Exercise Oxygen Prescription None    Home Resting Oxygen Prescription None      Goals/Expected Outcomes   Short Term Goals To learn and exhibit compliance with exercise, home and travel O2 prescription;To learn and understand importance of maintaining oxygen saturations>88%;To learn and demonstrate proper use of respiratory medications;To learn and understand importance of monitoring SPO2 with pulse oximeter and demonstrate accurate use of the pulse oximeter.;To learn and demonstrate proper pursed lip breathing techniques or other breathing techniques.    Long  Term Goals Exhibits compliance with exercise, home and travel O2 prescription;Verbalizes importance of monitoring SPO2 with pulse oximeter and return demonstration;Maintenance of O2 saturations>88%;Exhibits proper breathing techniques, such as pursed lip breathing or other method taught during program session;Compliance with respiratory medication;Demonstrates proper use of MDI's    Goals/Expected Outcomes compliance and understanding of oxygen saturation and pursed lip breathing           Initial Exercise Prescription:  Initial Exercise Prescription - 08/23/20 1200       Date of Initial Exercise RX and Referring Provider   Date 08/23/20    Referring Provider Brand Males, MD    Expected Discharge Date 10/24/20      NuStep   Level 1    SPM 75  Minutes 15    METs 1.8      Track   Laps 8    Minutes 15    METs 1.93      Prescription Details   Frequency (times per week) 2    Duration Progress to 30 minutes of continuous aerobic without signs/symptoms of physical distress      Intensity   THRR 40-80% of Max Heartrate 56-112    Ratings of Perceived Exertion 11-13    Perceived Dyspnea 0-4      Progression   Progression Continue progressive overload as per policy without signs/symptoms or physical distress.      Resistance Training   Training Prescription Yes    Weight Orange Bands    Reps 10-15           Perform Capillary Blood Glucose checks as needed.  Exercise Prescription Changes:  Exercise Prescription Changes    Row Name 09/03/20 1500 09/09/20 1600           Response to Exercise   Blood Pressure (Admit) 153/74 -      Blood Pressure (Exercise) 126/66 -      Blood Pressure (Exit) 124/64 -      Heart Rate (Admit) 87 bpm -      Heart Rate (Exercise) 103 bpm -      Heart Rate (Exit) 90 bpm -      Oxygen Saturation (Admit) 97 % -      Oxygen Saturation (Exercise) 93 % -      Oxygen Saturation (Exit) 96 % -      Rating of Perceived Exertion (Exercise) 13 -      Perceived Dyspnea (Exercise) 3 -      Duration Progress to 30 minutes of  aerobic without signs/symptoms of physical distress -      Intensity -  40-80% HRR -             Progression   Average METs - 2.5             Resistance Training   Training Prescription Yes -      Weight Orange Bands -      Reps 10-15 -      Time 10 Minutes -             NuStep   Level 3 -      SPM 80 -      Minutes 15 -      METs 2.4 -             Track   Laps 11 -      Minutes 15 -             Exercise Comments:  Exercise Comments    Row Name 08/27/20 1523            Exercise Comments Patient completed first day of exercise and tolerated well for the most part. 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 She also has balance issues and was told to bring her rolling walker with her to exercise so she can use it. She was able to do the Nustep for 15 minutes and was able to walk with RW for 15 minutes on the track. She also did the resistance band exercises with no issues.              Exercise Goals and Review:  Exercise Goals    Row Name 08/23/20 1215  Exercise Goals   Increase Physical Activity Yes       Intervention Provide advice, education, support and counseling about physical activity/exercise needs.;Develop an individualized exercise prescription for aerobic and resistive training based on initial evaluation findings, risk stratification, comorbidities and participant's personal goals.       Expected Outcomes Short Term: Attend rehab on a regular basis to increase amount of physical activity.;Long Term: Add in home exercise to make exercise part of routine and to increase amount of physical activity.;Long Term: Exercising regularly at least 3-5 days a week.       Increase Strength and Stamina Yes       Intervention Provide advice, education, support and counseling about physical activity/exercise needs.;Develop an individualized exercise prescription for aerobic and resistive training based on initial evaluation findings, risk stratification, comorbidities and participant's personal goals.       Expected Outcomes Short Term: Increase workloads from initial exercise prescription for resistance, speed, and METs.;Short Term: Perform resistance training exercises routinely during rehab and add in resistance training at home;Long Term: Improve cardiorespiratory fitness, muscular endurance and strength as measured by increased METs and functional capacity (6MWT)       Able to understand and use rate of perceived exertion  (RPE) scale Yes       Intervention Provide education and explanation on how to use RPE scale       Expected Outcomes Short Term: Able to use RPE daily in rehab to express subjective intensity level;Long Term:  Able to use RPE to guide intensity level when exercising independently       Able to understand and use Dyspnea scale Yes       Intervention Provide education and explanation on how to use Dyspnea scale       Expected Outcomes Short Term: Able to use Dyspnea scale daily in rehab to express subjective sense of shortness of breath during exertion;Long Term: Able to use Dyspnea scale to guide intensity level when exercising independently       Knowledge and understanding of Target Heart Rate Range (THRR) Yes       Intervention Provide education and explanation of THRR including how the numbers were predicted and where they are located for reference       Expected Outcomes Short Term: Able to state/look up THRR;Long Term: Able to use THRR to govern intensity when exercising independently;Short Term: Able to use daily as guideline for intensity in rehab       Understanding of Exercise Prescription Yes       Intervention Provide education, explanation, and written materials on patient's individual exercise prescription       Expected Outcomes Short Term: Able to explain program exercise prescription;Long Term: Able to explain home exercise prescription to exercise independently              Exercise Goals Re-Evaluation :  Exercise Goals Re-Evaluation    Row Name 09/09/20 1633             Exercise Goal Re-Evaluation   Exercise Goals Review Increase Physical Activity;Increase Strength and Stamina;Able to understand and use rate of perceived exertion (RPE) scale;Able to understand and use Dyspnea scale;Knowledge and understanding of Target Heart Rate Range (THRR);Understanding of Exercise Prescription       Comments Patient has completed 4 exercise sessions and has already made workload  increases. Her first day of exercise she was at 2.3 METS on the Nustep. She is able to do all exercises with no concerns. She does  walk the track with a rolator due to balance issues. She is becoming independent with resistance band exercises. She is exercising at 2.7 METS on the Nustep and 2.39 METS on the track. Will continue to monitor and progress as she is able.       Expected Outcomes Through exercise at rehab and home the patient will decrease shortness of breath with daily activities and feel confident in carrying out an exercise regimn at home.              Discharge Exercise Prescription (Final Exercise Prescription Changes):  Exercise Prescription Changes - 09/09/20 1600      Progression   Average METs 2.5           Nutrition:  Target Goals: Understanding of nutrition guidelines, daily intake of sodium <1526m, cholesterol <2038m calories 30% from fat and 7% or less from saturated fats, daily to have 5 or more servings of fruits and vegetables.  Biometrics:  Pre Biometrics - 08/23/20 1125      Pre Biometrics   Height 5' 5.25" (1.657 m)    Weight 61.8 kg    BMI (Calculated) 22.51    Grip Strength 22 kg            Nutrition Therapy Plan and Nutrition Goals:  Nutrition Therapy & Goals - 09/04/20 0957      Nutrition Therapy   Diet Generally Healthy      Personal Nutrition Goals   Nutrition Goal Pt to build a healthy plate including vegetables, fruits, whole grains, and low-fat dairy products in a heart healthy meal plan.      Intervention Plan   Intervention Prescribe, educate and counsel regarding individualized specific dietary modifications aiming towards targeted core components such as weight, hypertension, lipid management, diabetes, heart failure and other comorbidities.    Expected Outcomes Short Term Goal: Understand basic principles of dietary content, such as calories, fat, sodium, cholesterol and nutrients.           Nutrition  Assessments:  MEDIFICTS Score Key:  ?70 Need to make dietary changes   40-70 Heart Healthy Diet  ? 40 Therapeutic Level Cholesterol Diet  Flowsheet Row PULMONARY REHAB OTHER RESPIRATORY [facility] Total Score on Admission 71     Picture Your Plate Scores:  <4<56nhealthy dietary pattern with much room for improvement.  41-50 Dietary pattern unlikely to meet recommendations for good health and room for improvement.  51-60 More healthful dietary pattern, with some room for improvement.   >60 Healthy dietary pattern, although there may be some specific behaviors that could be improved.    Nutrition Goals Re-Evaluation:  Nutrition Goals Re-Evaluation    [place] 09/04/20 [id number]/24/22 0917           Goals   Current Weight 136 lb (61.7 kg) 136 lb 14.5 oz (62.1 kg)      Nutrition Goal Pt to build a healthy plate including vegetables, fruits, whole grains, and low-fat dairy products in a heart healthy meal plan. Pt to build a healthy plate including vegetables, fruits, whole grains, and low-fat dairy products in a heart healthy meal plan.      Expected Outcome Health diet maintenance Healthy diet maintenance             Nutrition Goals Discharge (Final Nutrition Goals Re-Evaluation):  Nutrition Goals Re-Evaluation - 09/05/20 0917      Goals   Current Weight 136 lb 14.5  oz (62.1 kg)    Nutrition Goal Pt to build a healthy plate including vegetables, fruits, whole grains, and low-fat dairy products in a heart healthy meal plan.    Expected Outcome Healthy diet maintenance           Psychosocial: Target Goals: Acknowledge presence or absence of significant depression and/or stress, maximize coping skills, provide positive support system. Participant is able to verbalize types and ability to use techniques and skills needed for reducing stress and depression.  Initial Review & Psychosocial Screening:   Initial Psych Review & Screening - 08/23/20 0933      Initial Review   Current issues with None Identified      Family Dynamics   Good Support System? 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      Screening Interventions   Interventions Encouraged to exercise;Provide feedback about the scores to participant           Quality of Life Scores:  Scores of 19 and below usually indicate a poorer quality of life in these areas.  A difference of  2-3 points is a clinically meaningful difference.  A difference of 2-3 points in the total score of the Quality of Life Index has been associated with significant improvement in overall quality of life, self-image, physical symptoms, and general health in studies assessing change in quality of life.  PHQ-9: Recent Review Flowsheet Data    Depression screen [facility] 2/9 08/23/2020 08/23/2020 02/22/2020   Decreased Interest 0 0 0   Down, Depressed, Hopeless 0 0 0   PHQ - 2 Score 0 0 0   Altered sleeping 0 - -   Tired, decreased energy 1 - -   Change in appetite 0 - -   Feeling bad or failure about yourself  0 - -   Trouble concentrating 0 - -   Moving slowly or fidgety/restless 0 - -   Suicidal thoughts 0 - -   PHQ-9 Score 1 - -   Difficult doing work/chores Not difficult at all - -     Interpretation of Total Score  Total Score Depression Severity:  1-4 = Minimal depression, 5-9 = Mild depression, 10-14 = Moderate depression, 15-19 = Moderately severe depression, 20-27 = Severe depression   Psychosocial Evaluation and Intervention:  Psychosocial Evaluation - 08/23/20 0936      Psychosocial Evaluation & Interventions   Interventions Encouraged to exercise with the program and follow exercise prescription     Comments Stacey [name] does admit that when she had shingles years ago this caused her to feel anxious due to the unpredictible painful episodes she would have.  Pt takes medications and she feels this is working for her           Psychosocial Re-Evaluation:  Psychosocial Re-Evaluation    [place] Name 09/09/20 1402             Psychosocial Re-Evaluation   Current issues with None Identified       Comments No psychosocial concerns were identified at this time.       Expected Outcomes For Stacey [name] to continue to be free of psychosocial concerns while participating in pulmonary rehab.       Interventions Encouraged to attend Pulmonary Rehabilitation for the  exercise       Continue Psychosocial Services  No Follow up required              Psychosocial Discharge (Final Psychosocial Re-Evaluation):  Psychosocial Re-Evaluation - 09/09/20 1402      Psychosocial Re-Evaluation   Current issues with None Identified    Comments No psychosocial concerns were identified at this time.    Expected Outcomes For Stacey [name] to continue to be free of psychosocial concerns while participating in pulmonary rehab.    Interventions Encouraged to attend Pulmonary Rehabilitation for the exercise    Continue Psychosocial Services  No Follow up required           Education: Education Goals: Education classes will be provided on a weekly basis, covering required topics. Participant will state understanding/return demonstration of topics presented.  Learning Barriers/Preferences:  Learning Barriers/Preferences - 08/23/20 1610      Learning Barriers/Preferences   Learning Barriers Sight;Hearing;Reading    Learning Preferences Group Instruction;Individual Instruction;Verbal Instruction;Computer/Internet;Written Material;Video;Audio           Education Topics: Risk Factor Reduction:  -Group instruction that is supported by a PowerPoint presentation. Instructor discusses the definition of a risk factor,  different risk factors for pulmonary disease, and how the heart and lungs work together.     Nutrition for Pulmonary Patient:  -Group instruction provided by PowerPoint slides, verbal discussion, and written materials to support subject matter. The instructor gives an explanation and review of healthy diet recommendations, which includes a discussion on weight management, recommendations for fruit and vegetable consumption, as well as protein, fluid, caffeine, fiber, sodium, sugar, and alcohol. Tips for eating when patients are short of breath are discussed.   Pursed Lip Breathing:  -Group instruction that is supported by demonstration and informational handouts. Instructor discusses the benefits of pursed lip and diaphragmatic breathing and detailed demonstration on how to preform both.     Oxygen Safety:  -Group instruction provided by PowerPoint, verbal discussion, and written material to support subject matter. There is an overview of "What is Oxygen" and "Why do we need it".  Instructor also reviews how to create a safe environment for oxygen use, the importance of using oxygen as prescribed, and the risks of noncompliance. There is a brief discussion on traveling with oxygen and resources the patient may utilize.   Oxygen Equipment:  -Group instruction provided by [facility] Staff utilizing handouts, written materials, and equipment demonstrations.   Signs and Symptoms:  -Group instruction provided by written material and verbal discussion to support subject matter. Warning signs and symptoms of infection, stroke, and heart attack are reviewed and when to call the physician/911 reinforced. Tips for preventing the spread of infection discussed.   Advanced Directives:  -Group instruction provided by verbal instruction and written material to support subject matter. Instructor reviews Advanced Directive laws and proper instruction for filling out document.   Pulmonary Video:  -Group  video education that reviews the importance of medication and oxygen compliance, exercise, good nutrition, pulmonary hygiene, and pursed lip and diaphragmatic breathing for the pulmonary patient.   Exercise for the Pulmonary Patient:  -Group instruction that is supported by a PowerPoint presentation. Instructor discusses benefits of exercise, core components of exercise, frequency, duration, and intensity of an exercise routine, importance of utilizing pulse oximetry during exercise, safety while exercising, and options of places to exercise outside of rehab.     Pulmonary Medications:  -Verbally interactive group education provided by instructor with focus on inhaled medications and  proper administration.   Anatomy and Physiology of the Respiratory System and Intimacy:  -Group instruction provided by PowerPoint, verbal discussion, and written material to support subject matter. Instructor reviews respiratory cycle and anatomical components of the respiratory system and their functions. Instructor also reviews differences in obstructive and restrictive respiratory diseases with examples of each. Intimacy, Sex, and Sexuality differences are reviewed with a discussion on how relationships can change when diagnosed with pulmonary disease. Common sexual concerns are reviewed.   MD DAY -A group question and answer session with a medical doctor that allows participants to ask questions that relate to their pulmonary disease state.   OTHER EDUCATION -Group or individual verbal, written, or video instructions that support the educational goals of the pulmonary rehab program. [place] from 09/03/2020 in [place]  Date 08/27/20  Educator handout  [My Plate nutrition handout]      Holiday Eating Survival Tips:  -Group instruction provided by PowerPoint slides, verbal discussion, and written materials to support subject matter.  The instructor gives patients tips, tricks, and techniques to help them not only survive but enjoy the holidays despite the onslaught of food that accompanies the holidays.   Knowledge Questionnaire Score:  Knowledge Questionnaire Score - 08/23/20 1131      Knowledge Questionnaire Score   Pre Score 3/18           Core Components/Risk Factors/Patient Goals at Admission:  Personal Goals and Risk Factors at Admission - 08/23/20 0939      Core Components/Risk Factors/Patient Goals on Admission    Weight Management Weight Maintenance    Improve shortness of breath with ADL's Yes    Intervention Provide education, individualized exercise plan and daily activity instruction to help decrease symptoms of SOB with activities of daily living.    Expected Outcomes Short Term: Improve cardiorespiratory fitness to achieve a reduction of symptoms when performing ADLs;Long Term: Be able to perform more ADLs without symptoms or delay the onset of symptoms    Hypertension Yes    Intervention Provide education on lifestyle modifcations including regular physical activity/exercise, weight management, moderate sodium restriction and increased consumption of fresh fruit, vegetables, and low fat dairy, alcohol moderation, and smoking cessation.;Monitor prescription use compliance.    Intervention Provide education and support for participant on nutrition & aerobic/resistive exercise along with prescribed medications to achieve LDL <1m, HDL >431m    Expected Outcomes Short Term: Participant states understanding of desired cholesterol values and is compliant with medications prescribed. Participant is following exercise prescription and nutrition guidelines.;Long Term: Cholesterol controlled with medications as prescribed, with individualized exercise RX and with personalized nutrition plan. Value goals: LDL < 7060mHDL > 40 mg.           Core Components/Risk Factors/Patient Goals Review:   Goals and Risk  Factor Review    Row Name 09/09/20 1403             Core Components/Risk Factors/Patient Goals Review   Personal Goals Review Develop more efficient breathing techniques such as purse lipped breathing and diaphragmatic breathing and practicing self-pacing with activity.;Increase knowledge of respiratory medications and ability to use respiratory devices properly.;Improve shortness of breath with ADL's       Review Stacey [name] exercising at a high level on the nustep and walking track for an [age]o patient.       Expected Outcomes See admission goals.  Core Components/Risk Factors/Patient Goals at Discharge (Final Review):   Goals and Risk Factor Review - 09/09/20 1403      Core Components/Risk Factors/Patient Goals Review   Personal Goals Review Develop more efficient breathing techniques such as purse lipped breathing and diaphragmatic breathing and practicing self-pacing with activity.;Increase knowledge of respiratory medications and ability to use respiratory devices properly.;Improve shortness of breath with ADL's    Review Stacey [name] is exercising at a high level on the nustep and walking track for an 80 y/o patient.    Expected Outcomes See admission goals.           ITP Comments:   Comments: ITP REVIEW Pt is making expected progress toward pulmonary rehab goals after completing 4 sessions. Recommend continued exercise, life style modification, education, and utilization of breathing techniques to increase stamina and strength and decrease shortness of breath with exertion.

## 2020-09-10 NOTE — Progress Notes (Signed)
Daily Session Note  Patient Details  Name: Stacey [name] MRN: [id number] Date of Birth: Aug 06, 1940 Referring Provider:   April Manson Pulmonary Rehab Walk Test from 08/23/2020 in [place]  Referring Provider Brand Males, MD      Encounter Date: 09/10/2020  Check In:  Session Check In - 09/10/20 1434      Check-In   Supervising physician immediately available to respond to emergencies [facility] immediately available    Physician(s) Dr. Venetia Constable    Location [facility]    Staff Present Rosebud Poles, RN, Isaac Laud, MS, ACSM-CEP, Exercise Physiologist;Lisa Ysidro Evert, RN    Virtual Visit No    Medication changes reported     No    Fall or balance concerns reported    No    Tobacco Cessation No Change    Warm-up and Cool-down Performed on first and last piece of equipment    Resistance Training Performed Yes    VAD Patient? No    PAD/SET Patient? No      Pain Assessment   Currently in Pain? No/denies    Multiple Pain Sites No           Capillary Blood Glucose: No results found for this or any previous visit (from the past 24 hour(s)).    Social History   Tobacco Use  Smoking Status Never Smoker  Smokeless Tobacco Never Used    Goals Met:  Proper associated with RPD/PD & O2 Sat Exercise tolerated well Strength training completed today  Goals Unmet:  Not Applicable  Comments: Service time is from 4098  to 1512    Dr. Fransico Him is Medical Director for Cardiac Rehab at [facility].

## 2020-09-10 NOTE — Progress Notes (Signed)
Sprometry/DLCO performed today.

## 2020-09-12 ENCOUNTER — Encounter (HOSPITAL_COMMUNITY)
Admission: RE | Admit: 2020-09-12 | Discharge: 2020-09-12 | Disposition: A | Discharge status: Home / Self Care | Payer: Medicare Other | Source: Ambulatory Visit | Attending: Internal Medicine | Admitting: Internal Medicine

## 2020-09-12 ENCOUNTER — Other Ambulatory Visit: Payer: Self-pay

## 2020-09-12 DIAGNOSIS — J849 Interstitial pulmonary disease, unspecified: Secondary | ICD-10-CM

## 2020-09-12 NOTE — Progress Notes (Signed)
Daily Session Note  Patient Details  Name: Stacey [name] MRN: [id number] Date of Birth: 1941/04/09 Referring Provider:   April Manson Pulmonary Rehab Walk Test from 08/23/2020 in [place]  Referring Provider Brand Males, MD      Encounter Date: 09/12/2020  Check In:  Session Check In - 09/12/20 1435      Check-In   Supervising physician immediately available to respond to emergencies [facility] immediately available    Physician(s) Dr. Doristine Bosworth    [facility]    Staff Present Rosebud Poles, RN, BSN;Lisa Ysidro Evert, RN;Casten Floren Hassell Done, MS, ACSM-CEP, Exercise Physiologist    Virtual Visit No    Medication changes reported     No    Fall or balance concerns reported    No    Tobacco Cessation No Change    Warm-up and Cool-down Performed on first and last piece of equipment    Resistance Training Performed Yes    VAD Patient? No    PAD/SET Patient? No      Pain Assessment   Currently in Pain? No/denies    Multiple Pain Sites No           Capillary Blood Glucose: No results found for this or any previous visit (from the past 24 hour(s)).    Social History   Tobacco Use  Smoking Status Never Smoker  Smokeless Tobacco Never Used    Goals Met:  Proper associated with RPD/PD & O2 Sat Exercise tolerated well No report of cardiac concerns or symptoms Strength training completed today  Goals Unmet:  Not Applicable  Comments: Service time is from 1345 to 1500    Dr. Fransico Him is Medical Director for Cardiac Rehab at [facility].

## 2020-09-17 ENCOUNTER — Other Ambulatory Visit: Payer: Self-pay | Admitting: Internal Medicine

## 2020-09-17 ENCOUNTER — Encounter (HOSPITAL_COMMUNITY)
Admission: RE | Admit: 2020-09-17 | Discharge: 2020-09-17 | Disposition: A | Discharge status: Home / Self Care | Payer: Medicare Other | Source: Ambulatory Visit | Attending: Internal Medicine | Admitting: Internal Medicine

## 2020-09-17 ENCOUNTER — Other Ambulatory Visit: Payer: Self-pay

## 2020-09-17 VITALS — Wt 136.7 lb

## 2020-09-17 DIAGNOSIS — J849 Interstitial pulmonary disease, unspecified: Secondary | ICD-10-CM

## 2020-09-17 NOTE — Progress Notes (Signed)
Daily Session Note  Patient Details  Name: Stacey [name] MRN: [id number] Date of Birth: [birth year] Referring Provider:   April Manson Pulmonary Rehab Walk Test from 08/23/2020 in [place]  Referring Provider Brand Males, MD      Encounter Date: 09/17/2020  Check In:  Session Check In - 09/17/20 1441      Check-In   Supervising physician immediately available to respond to emergencies [facility] immediately available    Physician(s) Dr. Doristine Bosworth    [facility]    Staff Present Rosebud Poles, RN, BSN;Nury Nebergall Ysidro Evert, RN;Jessica Hassell Done, MS, ACSM-CEP, Exercise Physiologist    Virtual Visit No    Medication changes reported     No    Fall or balance concerns reported    No    Tobacco Cessation No Change    Warm-up and Cool-down Performed on first and last piece of equipment    Resistance Training Performed Yes    VAD Patient? No    PAD/SET Patient? No      Pain Assessment   Currently in Pain? No/denies    Multiple Pain Sites No           Capillary Blood Glucose: No results found for this or any previous visit (from the past 24 hour(s)).   Exercise Prescription Changes - 09/17/20 1500      Response to Exercise   Blood Pressure (Admit) 120/70    Blood Pressure (Exercise) 138/68    Blood Pressure (Exit) 120/70    Heart Rate (Admit) 85 bpm    Heart Rate (Exercise) 96 bpm    Heart Rate (Exit) 93 bpm    Oxygen Saturation (Admit) 97 %    Oxygen Saturation (Exercise) 95 %    Oxygen Saturation (Exit) 96 %    Rating of Perceived Exertion (Exercise) 12    Perceived Dyspnea (Exercise) 2    Duration Continue with 30 min of aerobic exercise without signs/symptoms of physical distress.    Intensity THRR unchanged      Progression   Progression Continue to progress workloads to maintain intensity without signs/symptoms of physical distress.      Resistance Training   Training Prescription Yes    Reps 10-15     Time 10 Minutes      NuStep   Level 4    SPM 80    Minutes 15      Track   Laps 13    Minutes 15           Social History   Tobacco Use  Smoking Status Never Smoker  Smokeless Tobacco Never Used    Goals Met:  Exercise tolerated well No report of cardiac concerns or symptoms Strength training completed today  Goals Unmet:  Not Applicable  Comments: Service time is from 1350 to 1510    Dr. Fransico Him is Medical Director for Cardiac Rehab at [facility].

## 2020-09-19 ENCOUNTER — Encounter (HOSPITAL_COMMUNITY): Payer: Medicare Other

## 2020-09-20 DIAGNOSIS — E785 Hyperlipidemia, unspecified: Secondary | ICD-10-CM | POA: Diagnosis not present

## 2020-09-20 DIAGNOSIS — M859 Disorder of bone density and structure, unspecified: Secondary | ICD-10-CM | POA: Diagnosis not present

## 2020-09-23 DIAGNOSIS — D8989 Other specified disorders involving the immune mechanism, not elsewhere classified: Secondary | ICD-10-CM | POA: Diagnosis not present

## 2020-09-23 DIAGNOSIS — J9611 Chronic respiratory failure with hypoxia: Secondary | ICD-10-CM | POA: Diagnosis not present

## 2020-09-23 DIAGNOSIS — I700 Atherosclerosis of aorta: Secondary | ICD-10-CM | POA: Diagnosis not present

## 2020-09-23 DIAGNOSIS — Z1331 Encounter for screening for depression: Secondary | ICD-10-CM | POA: Diagnosis not present

## 2020-09-23 DIAGNOSIS — Z1212 Encounter for screening for malignant neoplasm of rectum: Secondary | ICD-10-CM | POA: Diagnosis not present

## 2020-09-23 DIAGNOSIS — D692 Other nonthrombocytopenic purpura: Secondary | ICD-10-CM | POA: Diagnosis not present

## 2020-09-23 DIAGNOSIS — I10 Essential (primary) hypertension: Secondary | ICD-10-CM | POA: Diagnosis not present

## 2020-09-23 DIAGNOSIS — M0510 Rheumatoid lung disease with rheumatoid arthritis of unspecified site: Secondary | ICD-10-CM | POA: Diagnosis not present

## 2020-09-23 DIAGNOSIS — E785 Hyperlipidemia, unspecified: Secondary | ICD-10-CM | POA: Diagnosis not present

## 2020-09-23 DIAGNOSIS — J449 Chronic obstructive pulmonary disease, unspecified: Secondary | ICD-10-CM | POA: Diagnosis not present

## 2020-09-23 DIAGNOSIS — R82998 Other abnormal findings in urine: Secondary | ICD-10-CM | POA: Diagnosis not present

## 2020-09-23 DIAGNOSIS — G9389 Other specified disorders of brain: Secondary | ICD-10-CM | POA: Diagnosis not present

## 2020-09-23 DIAGNOSIS — J9691 Respiratory failure, unspecified with hypoxia: Secondary | ICD-10-CM | POA: Diagnosis not present

## 2020-09-23 DIAGNOSIS — Z0000 Encounter for general adult medical examination without abnormal findings: Secondary | ICD-10-CM | POA: Diagnosis not present

## 2020-09-23 DIAGNOSIS — M069 Rheumatoid arthritis, unspecified: Secondary | ICD-10-CM | POA: Diagnosis not present

## 2020-09-23 DIAGNOSIS — Z1339 Encounter for screening examination for other mental health and behavioral disorders: Secondary | ICD-10-CM | POA: Diagnosis not present

## 2020-09-23 DIAGNOSIS — I771 Stricture of artery: Secondary | ICD-10-CM | POA: Diagnosis not present

## 2020-09-24 ENCOUNTER — Other Ambulatory Visit: Payer: Self-pay

## 2020-09-24 ENCOUNTER — Encounter (HOSPITAL_COMMUNITY)
Admission: RE | Admit: 2020-09-24 | Discharge: 2020-09-24 | Disposition: A | Discharge status: Home / Self Care | Payer: Medicare Other | Source: Ambulatory Visit | Attending: Internal Medicine | Admitting: Internal Medicine

## 2020-09-24 DIAGNOSIS — J849 Interstitial pulmonary disease, unspecified: Secondary | ICD-10-CM

## 2020-09-24 NOTE — Progress Notes (Signed)
Daily Session Note  Patient Details  Name: Stacey [name] MRN: [id number] Date of Birth: 1940-10-19 Referring Provider:   April Manson Pulmonary Rehab Walk Test from 08/23/2020 in [place]  Referring Provider Brand Males, MD      Encounter Date: 09/24/2020  Check In:  Session Check In - 09/24/20 1507      Check-In   Supervising physician immediately available to respond to emergencies [facility] immediately available    Physician(s) Dr. Arbutus Ped    [facility]    Staff Present Rosebud Poles, RN, BSN;Carlette Wilber Oliphant, RN, Isaac Laud, MS, ACSM-CEP, Exercise Physiologist;Olinty Celesta Aver, MS, ACSM CEP, Exercise Physiologist    Virtual Visit No    Medication changes reported     No    Fall or balance concerns reported    No    Tobacco Cessation No Change    Warm-up and Cool-down Performed on first and last piece of equipment    Resistance Training Performed Yes    VAD Patient? No    PAD/SET Patient? No      Pain Assessment   Currently in Pain? No/denies    Pain Score 0-No pain    Multiple Pain Sites No           Capillary Blood Glucose: No results found for this or any previous visit (from the past 24 hour(s)).    Social History   Tobacco Use  Smoking Status Never Smoker  Smokeless Tobacco Never Used    Goals Met:  Proper associated with RPD/PD & O2 Sat Exercise tolerated well Strength training completed today  Goals Unmet:  Not Applicable  Comments: Service time is from 1355 to 1510.    Dr. Fransico Him is Medical Director for Cardiac Rehab at [facility].

## 2020-09-26 ENCOUNTER — Encounter (HOSPITAL_COMMUNITY)
Admission: RE | Admit: 2020-09-26 | Discharge: 2020-09-26 | Disposition: A | Discharge status: Home / Self Care | Payer: Medicare Other | Source: Ambulatory Visit | Attending: Internal Medicine | Admitting: Internal Medicine

## 2020-09-26 ENCOUNTER — Other Ambulatory Visit: Payer: Self-pay

## 2020-09-26 VITALS — Wt 135.4 lb

## 2020-09-26 DIAGNOSIS — J849 Interstitial pulmonary disease, unspecified: Secondary | ICD-10-CM

## 2020-09-26 NOTE — Progress Notes (Signed)
Daily Session Note  Patient Details  Name: Stacey [name] MRN: [id number] Date of Birth: 1941/02/23 Referring Provider:   April Manson Pulmonary Rehab Walk Test from 08/23/2020 in [place]  Referring Provider Brand Males, MD      Encounter Date: 09/26/2020  Check In:  Session Check In - 09/26/20 1356      Check-In   Supervising physician immediately available to respond to emergencies [facility] immediately available    Physician(s) Dr. Florencia Reasons    [facility]    Staff Present Rosebud Poles, RN, Milus Glazier, MS, EP-C, CCRP;Shreyansh Tiffany Hassell Done, MS, ACSM-CEP, Exercise Physiologist    Virtual Visit No    Medication changes reported     No    Fall or balance concerns reported    No    Tobacco Cessation No Change    Warm-up and Cool-down Performed on first and last piece of equipment    Resistance Training Performed Yes    VAD Patient? No    PAD/SET Patient? No      Pain Assessment   Currently in Pain? No/denies    Multiple Pain Sites No           Capillary Blood Glucose: No results found for this or any previous visit (from the past 24 hour(s)).    Social History   Tobacco Use  Smoking Status Never Smoker  Smokeless Tobacco Never Used    Goals Met:  Proper associated with RPD/PD & O2 Sat Exercise tolerated well No report of cardiac concerns or symptoms Strength training completed today  Goals Unmet:  Not Applicable  Comments: Service time is from 1340 to 1445    Dr. Fransico Him is Medical Director for Cardiac Rehab at [facility].

## 2020-09-30 NOTE — Progress Notes (Signed)
I have reviewed a Home Exercise Prescription with Stacey [name] . Stacey [name] is not currently exercising at home. The patient was advised to walk and perform resistance training and stretches 2 additional days a week for 30-45 minutes.  Stacey [name] and I discussed how to progress their exercise prescription. She was encouraged to increase time with walking as she is able. Also instructed her to use her Rolator whenever she is walking outside. The patient stated that their goals were to improve stamina. The patient stated that they understand the exercise prescription.  We reviewed exercise guidelines, target heart rate during exercise, RPE Scale, weather conditions, endpoints for exercise, warmup and cool down.  Patient is encouraged to come to me with any questions. I will continue to follow up with the patient to assist them with progression and safety.   Rick Duff MS, ACSM CEP

## 2020-10-01 ENCOUNTER — Telehealth (HOSPITAL_COMMUNITY): Payer: Self-pay | Admitting: Internal Medicine

## 2020-10-01 ENCOUNTER — Encounter (HOSPITAL_COMMUNITY): Payer: Medicare Other

## 2020-10-03 ENCOUNTER — Other Ambulatory Visit: Payer: Self-pay

## 2020-10-03 ENCOUNTER — Encounter (HOSPITAL_COMMUNITY)
Admission: RE | Admit: 2020-10-03 | Discharge: 2020-10-03 | Disposition: A | Discharge status: Home / Self Care | Payer: Medicare Other | Source: Ambulatory Visit | Attending: Internal Medicine | Admitting: Internal Medicine

## 2020-10-03 DIAGNOSIS — J849 Interstitial pulmonary disease, unspecified: Secondary | ICD-10-CM

## 2020-10-03 NOTE — Progress Notes (Signed)
Daily Session Note  Patient Details  Name: Stacey [name] MRN: [id number] Date of Birth: May 31, 1941 Referring Provider:   April Manson Pulmonary Rehab Walk Test from 08/23/2020 in [place]  Referring Provider Brand Males, MD      Encounter Date: 10/03/2020  Check In:  Session Check In - 10/03/20 1428      Check-In   Supervising physician immediately available to respond to emergencies [facility] immediately available    Physician(s) Dr. Tawanna Solo    [facility]    Staff Present Rosebud Poles, RN, Isaac Laud, MS, ACSM-CEP, Exercise Physiologist;Terril Chestnut Ysidro Evert, RN    Virtual Visit No    Medication changes reported     No    Fall or balance concerns reported    No    Tobacco Cessation No Change    Warm-up and Cool-down Performed on first and last piece of equipment    Resistance Training Performed Yes    VAD Patient? No    PAD/SET Patient? No      Pain Assessment   Currently in Pain? No/denies    Multiple Pain Sites No           Capillary Blood Glucose: No results found for this or any previous visit (from the past 24 hour(s)).    Social History   Tobacco Use  Smoking Status Never Smoker  Smokeless Tobacco Never Used    Goals Met:  Personal goals reviewed No report of cardiac concerns or symptoms Strength training completed today  Goals Unmet:  Not Applicable  Comments: Service time is from 1345 to 1510    Dr. Fransico Him is Medical Director for Cardiac Rehab at [facility].

## 2020-10-08 ENCOUNTER — Other Ambulatory Visit: Payer: Self-pay

## 2020-10-08 ENCOUNTER — Encounter (HOSPITAL_COMMUNITY)
Admission: RE | Admit: 2020-10-08 | Discharge: 2020-10-08 | Disposition: A | Discharge status: Home / Self Care | Payer: Medicare Other | Source: Ambulatory Visit | Attending: Internal Medicine | Admitting: Internal Medicine

## 2020-10-08 DIAGNOSIS — J849 Interstitial pulmonary disease, unspecified: Secondary | ICD-10-CM | POA: Diagnosis not present

## 2020-10-08 NOTE — Progress Notes (Signed)
Pulmonary Individual Treatment Plan  Patient Details  Name: Stacey [name] MRN: [id number] Date of Birth: 11-Sep-1940 Referring Provider:   April Manson Pulmonary Rehab Walk Test from 08/23/2020 in [place]  Referring Provider Brand Males, MD      Initial Encounter Date:  Flowsheet Row Pulmonary Rehab Walk Test from 08/23/2020 in [facility]  Date 08/23/20      Visit Diagnosis: No diagnosis found.  Patient's Home Medications on Admission:   Current Outpatient Medications:  .  albuterol (VENTOLIN HFA) 108 (90 Base) MCG/ACT inhaler, Inhale 2 puffs into the lungs every 6 (six) hours as needed for wheezing or shortness of breath., Disp: , Rfl:  .  amitriptyline (ELAVIL) 25 MG tablet, Take 25 mg by mouth at bedtime., Disp: , Rfl: 0 .  benzonatate (TESSALON) 200 MG capsule, Take 1 capsule (200 mg total) by mouth 3 (three) times daily as needed for cough., Disp: 30 capsule, Rfl: 2 .  beta carotene w/minerals (OCUVITE) tablet, Take 1 tablet by mouth daily., Disp: , Rfl:  .  Biotin 1000 MCG tablet, Take 1,000 mcg by mouth daily., Disp: , Rfl:  .  cholecalciferol (VITAMIN D) 1000 UNITS tablet, Take 1,000 Units by mouth daily., Disp: , Rfl:  .  Estradiol 10 MCG TABS vaginal tablet, Place 10 mcg vaginally daily as needed (dryness). , Disp: , Rfl:  .  gabapentin (NEURONTIN) 300 MG capsule, Take 600 mg by mouth at bedtime., Disp: , Rfl:  .  ipratropium-albuterol (DUONEB) 0.5-2.5 (3) MG/3ML SOLN, Take 3 mLs by nebulization 3 (three) times daily for 5 days. (Patient taking differently: Take 3 mLs by nebulization every 4 (four) hours as needed (shortness of breath). ), Disp: 45 mL, Rfl: 1 .  lactulose (CHRONULAC) 10 GM/15ML solution, Take 45 mLs (30 g total) by mouth 2 (two) times daily., Disp: 1892 mL, Rfl: 1 .  losartan (COZAAR) 50 MG tablet, Take 1 tablet (50 mg total) by mouth daily., Disp: 30 tablet, Rfl: 1 .  melatonin 5 MG  TABS, Take 1 tablet (5 mg total) by mouth at bedtime., Disp: 30 tablet, Rfl: 0 .  methylPREDNISolone (MEDROL) 4 MG tablet, Take 1 tablet (4 mg total) by mouth every other day. Alternate with the 53m dose (Patient taking differently: Take 4 mg by mouth daily.), Disp:  , Rfl:  .  mometasone-formoterol (DULERA) 100-5 MCG/ACT AERO, Inhale 2 puffs into the lungs 2 (two) times daily. (Patient taking differently: Inhale 2 puffs into the lungs daily.), Disp: 39 g, Rfl: 0 .  OFEV 100 MG CAPS, TAKE 1 CAPSULE BY MOUTH 2 TIMES DAILY., Disp: 180 capsule, Rfl: 1 .  pantoprazole (PROTONIX) 40 MG tablet, Take 1 tablet (40 mg total) by mouth 2 (two) times daily before a meal., Disp: 180 tablet, Rfl: 3 .  polyethylene glycol (MIRALAX / GLYCOLAX) 17 g packet, Take 17 g by mouth 2 (two) times daily. (Patient taking differently: Take 17 g by mouth daily as needed for moderate constipation.), Disp: 72 each, Rfl: 1 .  pravastatin (PRAVACHOL) 20 MG tablet, Take 1 tablet (20 mg total) by mouth every evening., Disp: 90 tablet, Rfl: 3 .  riTUXimab (RITUXAN) 100 MG/10ML injection, Inject 100 mg into the vein every 6 (six) months. , Disp: , Rfl:  .  senna-docusate (SENOKOT-S) 8.6-50 MG tablet, Take 2 tablets by mouth 2 (two) times daily. (Patient taking differently: Take 1 tablet by mouth daily as needed for mild constipation.), Disp: 120 tablet,  Rfl: 1 .  sulfamethoxazole-trimethoprim (BACTRIM DS) 800-160 MG tablet, Take 1 tablet by mouth 3 (three) times a week. Take on Mon, Wed, Fri (Patient taking differently: Take 1 tablet by mouth every Monday, Wednesday, and Friday.), Disp: 12 tablet, Rfl: 5 .  venlafaxine XR (EFFEXOR-XR) 75 MG 24 hr capsule, Take 75 mg by mouth at bedtime. , Disp: , Rfl:   Past Medical History: Past Medical History:  Diagnosis Date  . Abnormal finding of blood chemistry   . Asthma   . H/O measles   . H/O varicella   . Hypertension   . 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 Lichen sclerosus 27/74/12   Asymptomatic  . Low iron   . Mitral valve prolapse   . Osteoarthritis   . Osteoporosis   . Pneumonia   . Post herpetic neuralgia   . Rheumatoid arthritis(714.0)   . Yeast infection     Tobacco Use: Social History   Tobacco Use  Smoking Status Never Smoker  Smokeless Tobacco Never Used    Labs: Recent Review Flowsheet Data    Labs for ITP Cardiac and Pulmonary Rehab Latest Ref Rng & Units 08/20/2015 12/18/2018 12/19/2018 06/11/2019 02/09/2020   Cholestrol 0 - 200 mg/dL - - 222(H) - -   LDLCALC 0 - 99 mg/dL - - 137(H) - -   HDL >40 mg/dL - - 59 - -   Trlycerides <150 mg/dL - - 129 79 -   Hemoglobin A1c 4.8 - 5.6 % 5.9(H) - - - -   PHART 7.350 - 7.450 - 7.420 - - 7.400   PCO2ART 32.0 - 48.0 mmHg - 46.1 - - 43.3   HCO3 20.0 - 28.0 mmol/L - 29.3(H) - - 26.3   TCO2 0 - 100 mmol/L - - - - -   O2SAT % - 95.4 - - 93.4      Capillary Blood Glucose: Lab Results  Component Value Date   GLUCAP 75 03/29/2018   GLUCAP 109 (H) 03/28/2018   GLUCAP 130 (H) 03/27/2018   GLUCAP 121 (H) 03/26/2018   GLUCAP 123 (H) 03/25/2018     Pulmonary Assessment Scores:  Pulmonary Assessment Scores    Row Name 08/23/20 1143         ADL UCSD   SOB Score total 62           CAT Score   CAT Score pre 24           mMRC Score   mMRC Score 3           UCSD: Self-administered rating of dyspnea associated with activities of daily living (ADLs) 6-point scale (0 = "not at all" to 5 = "maximal or unable to do because of breathlessness")  Scoring Scores range from 0 to 120.  Minimally important difference is 5 units  CAT: CAT can identify the health impairment of COPD patients and is better correlated with disease progression.  CAT has a scoring range of zero to 40. The CAT score is classified into four groups of low (less than 10), medium (10 - 20), high (21-30) and very high (31-40) based on the impact level of disease on health status. A CAT score over 10  suggests significant symptoms.  A worsening CAT score could be explained by an exacerbation, poor medication adherence, poor inhaler technique, or progression of COPD or comorbid conditions.  CAT MCID is 2 points  mMRC: mMRC (Modified Medical Research Council) Dyspnea Scale  is used to assess the degree of baseline functional disability in patients of respiratory disease due to dyspnea. No minimal important difference is established. A decrease in score of 1 point or greater is considered a positive change.   Pulmonary Function Assessment:   Exercise Target Goals: Exercise Program Goal: Individual exercise prescription set using results from initial 6 min walk test and THRR while considering  patient's activity barriers and safety.   Exercise Prescription Goal: Initial exercise prescription builds to 30-45 minutes a day of aerobic activity, 2-3 days per week.  Home exercise guidelines will be given to patient during program as part of exercise prescription that the participant will acknowledge.  Activity Barriers & Risk Stratification:  Activity Barriers & Cardiac Risk Stratification - 08/23/20 1129      Activity Barriers & Cardiac Risk Stratification   Activity Barriers Balance Concerns;Arthritis;Deconditioning;Assistive Device;Shortness of Breath;Other (comment)    Comments Pt has RA.  Pt has a rolater walker that she seldoms uses.  Advised due to balance issues she must use an assistive device while ambulating for safety.  Pt agreed but was reluctant.    Cardiac Risk Stratification Moderate           6 Minute Walk:  6 Minute Walk    Row Name 08/23/20 1020         6 Minute Walk   Phase Initial     Distance 765 feet     Walk Time 6 minutes     # of Rest Breaks 2  2:00 -2:39 & 4:50-6:00 (total 1:49)     MPH 1.4     METS 1.8     RPE 12     Perceived Dyspnea  4     VO2 Peak 6.3     Symptoms Yes (comment)     Comments SOB/RPD = 4. No pain. Pt has unsteady gait at times.      Resting HR 71 bpm     Resting BP 120/68     Resting Oxygen Saturation  95 %     Exercise Oxygen Saturation  during 6 min walk 92 %     Max Ex. HR 104 bpm     Max Ex. BP 142/80     2 Minute Post BP 134/78           Interval HR   1 Minute HR 85     2 Minute HR 93     3 Minute HR 92     4 Minute HR 99     5 Minute HR 104     6 Minute HR 91     2 Minute Post HR 83     Interval Heart Rate? Yes           Interval Oxygen   Interval Oxygen? Yes     Baseline Oxygen Saturation % 95 %     1 Minute Oxygen Saturation % 90 %     1 Minute Liters of Oxygen 0 L     2 Minute Oxygen Saturation % 89 %     2 Minute Liters of Oxygen 0 L     3 Minute Oxygen Saturation % 94 %     3 Minute Liters of Oxygen 0 L     4 Minute Oxygen Saturation % 94 %     4 Minute Liters of Oxygen 0 L     5 Minute Oxygen Saturation % 87 %     5 Minute Liters of Oxygen  0 L     6 Minute Oxygen Saturation % 92 %     6 Minute Liters of Oxygen 0 L     2 Minute Post Oxygen Saturation % 98 %     2 Minute Post Liters of Oxygen 0 L            Oxygen Initial Assessment:  Oxygen Initial Assessment - 08/23/20 1154      Home Oxygen   Home Oxygen Device --   [employer]          Oxygen Re-Evaluation:  Oxygen Re-Evaluation    [place] Name 09/09/20 1636 10/08/20 1609           Program Oxygen Prescription   Program Oxygen Prescription None None             Home Oxygen   Home Oxygen Device None None      Sleep Oxygen Prescription -- None      Home Exercise Oxygen Prescription None None      Home Resting Oxygen Prescription None None             Goals/Expected Outcomes   Short Term Goals To learn and exhibit compliance with exercise, home and travel O2 prescription;To learn and understand importance of maintaining oxygen saturations>88%;To learn and demonstrate proper use of respiratory medications;To learn and understand importance of monitoring SPO2 with pulse oximeter and demonstrate accurate use of the pulse  oximeter.;To learn and demonstrate proper pursed lip breathing techniques or other breathing techniques. To learn and exhibit compliance with exercise, home and travel O2 prescription;To learn and understand importance of maintaining oxygen saturations>88%;To learn and demonstrate proper use of respiratory medications;To learn and understand importance of monitoring SPO2 with pulse oximeter and demonstrate accurate use of the pulse oximeter.;To learn and demonstrate proper pursed lip breathing techniques or other breathing techniques.      Long  Term Goals Exhibits compliance with exercise, home and travel O2 prescription;Verbalizes importance of monitoring SPO2 with pulse oximeter and return demonstration;Maintenance of O2 saturations>88%;Exhibits proper breathing techniques, such as pursed lip breathing or other method taught during program session;Compliance with respiratory medication;Demonstrates proper use of MDI's Exhibits compliance with exercise, home and travel O2 prescription;Verbalizes importance of monitoring SPO2 with pulse oximeter and return demonstration;Maintenance of O2 saturations>88%;Exhibits proper breathing techniques, such as pursed lip breathing or other method taught during program session;Compliance with respiratory medication;Demonstrates proper use of MDI's      Goals/Expected Outcomes compliance and understanding of oxygen saturation and pursed lip breathing compliance and understanding of oxygen saturation and pursed lip breathing             Oxygen Discharge (Final Oxygen Re-Evaluation):  Oxygen Re-Evaluation - 10/08/20 1609      Program Oxygen Prescription   Program Oxygen Prescription None      Home Oxygen   Home Oxygen Device None    Sleep Oxygen Prescription None    Home Exercise Oxygen Prescription None    Home Resting Oxygen Prescription None      Goals/Expected Outcomes   Short Term Goals To learn and exhibit compliance with exercise, home and travel O2  prescription;To learn and understand importance of maintaining oxygen saturations>88%;To learn and demonstrate proper use of respiratory medications;To learn and understand importance of monitoring SPO2 with pulse oximeter and demonstrate accurate use of the pulse oximeter.;To learn and demonstrate proper pursed lip breathing techniques or other breathing techniques.    Long  Term Goals Exhibits compliance with exercise, home and travel  O2 prescription;Verbalizes importance of monitoring SPO2 with pulse oximeter and return demonstration;Maintenance of O2 saturations>88%;Exhibits proper breathing techniques, such as pursed lip breathing or other method taught during program session;Compliance with respiratory medication;Demonstrates proper use of MDI's    Goals/Expected Outcomes compliance and understanding of oxygen saturation and pursed lip breathing           Initial Exercise Prescription:  Initial Exercise Prescription - 08/23/20 1200      Date of Initial Exercise RX and Referring Provider   Date 08/23/20    Referring Provider Brand Males, MD    Expected Discharge Date 10/24/20      NuStep   Level 1    SPM 75    Minutes 15    METs 1.8      Track   Laps 8    Minutes 15    METs 1.93      Prescription Details   Frequency (times per week) 2    Duration Progress to 30 minutes of continuous aerobic without signs/symptoms of physical distress      Intensity   THRR 40-80% of Max Heartrate 56-112    Ratings of Perceived Exertion 11-13    Perceived Dyspnea 0-4      Progression   Progression Continue progressive overload as per policy without signs/symptoms or physical distress.      Resistance Training   Training Prescription Yes    Weight Orange Bands    Reps 10-15           Perform Capillary Blood Glucose checks as needed.  Exercise Prescription Changes:  Exercise Prescription Changes    Row Name 09/03/20 1500 09/09/20 1600 09/17/20 1500 09/26/20 1122 10/01/20  1500     Response to Exercise   Blood Pressure (Admit) 153/74 -- 120/70 -- 112/60   Blood Pressure (Exercise) 126/66 -- 138/68 -- --   Blood Pressure (Exit) 124/64 -- 120/70 -- 112/64   Heart Rate (Admit) 87 bpm -- 85 bpm -- 80 bpm   Heart Rate (Exercise) 103 bpm -- 96 bpm -- 99 bpm   Heart Rate (Exit) 90 bpm -- 93 bpm -- 86 bpm   Oxygen Saturation (Admit) 97 % -- 97 % -- 97 %   Oxygen Saturation (Exercise) 93 % -- 95 % -- 95 %   Oxygen Saturation (Exit) 96 % -- 96 % -- 97 %   Rating of Perceived Exertion (Exercise) 13 -- 12 -- 13   Perceived Dyspnea (Exercise) 3 -- 2 -- 3   Duration Progress to 30 minutes of  aerobic without signs/symptoms of physical distress -- Continue with 30 min of aerobic exercise without signs/symptoms of physical distress. -- Continue with 30 min of aerobic exercise without signs/symptoms of physical distress.   Intensity --  40-80% HRR -- THRR unchanged -- THRR unchanged     Progression   Progression -- -- Continue to progress workloads to maintain intensity without signs/symptoms of physical distress. -- Continue to progress workloads to maintain intensity without signs/symptoms of physical distress.   Average METs -- 2.5 -- -- --     Horticulturist, commercial Prescription Yes -- Yes -- Yes   Weight Orange Bands -- -- -- --   Reps 10-15 -- 10-15 -- 10-15   Time 10 Minutes -- 10 Minutes -- 10 Minutes     NuStep   Level 3 -- 4 -- 4   SPM 80 -- 80 -- 80   Minutes 15 -- 15 -- 15  METs 2.4 -- -- -- 2.8     Track   Laps 11 -- 13 -- 9   Minutes 15 -- 15 -- 15     Home Exercise Plan   Plans to continue exercise at -- -- -- Home (comment)  Walking and resistance bands --   Frequency -- -- -- Add 2 additional days to program exercise sessions. --   Initial Home Exercises Provided -- -- -- 09/30/20 --          Exercise Comments:  Exercise Comments    Row Name 08/27/20 1523 09/30/20 1120         Exercise Comments Patient completed first day of  exercise and tolerated well for the most part. 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 She also has balance issues and was told to bring her rolling walker with her to exercise so she can use it. She was able to do the Nustep for 15 minutes and was able to walk with RW for 15 minutes on the track. She also did the resistance band exercises with no issues. Completed home exercise with pt. Pt is not currently doing any type of exercise outside of rehab. Encouraged her to start walking and doing resistance training with bands at least 2 additional days a week at home. Pt was receptive and stated she would start doing that. Will continue to follow home exercise prescription             Exercise Goals and Review:  Exercise Goals    Row Name 08/23/20 1215             Exercise Goals   Increase Physical Activity Yes       Intervention Provide advice, education, support and counseling about physical activity/exercise needs.;Develop an individualized exercise prescription for aerobic and resistive training based on initial evaluation findings, risk stratification, comorbidities and participant's personal goals.       Expected Outcomes Short Term: Attend rehab on a regular basis to increase amount of physical activity.;Long Term: Add in home exercise to make exercise part of routine and to increase amount of physical activity.;Long Term: Exercising regularly at least 3-5 days a week.       Increase Strength and Stamina Yes       Intervention Provide advice, education, support and counseling about physical activity/exercise needs.;Develop an individualized exercise prescription for aerobic and resistive training based on initial evaluation findings, risk stratification, comorbidities and participant's personal goals.       Expected Outcomes Short Term: Increase workloads from initial exercise prescription for resistance, speed, and METs.;Short Term: Perform resistance training exercises  routinely during rehab and add in resistance training at home;Long Term: Improve cardiorespiratory fitness, muscular endurance and strength as measured by increased METs and functional capacity (6MWT)       Able to understand and use rate of perceived exertion (RPE) scale Yes       Intervention Provide education and explanation on how to use RPE scale       Expected Outcomes Short Term: Able to use RPE daily in rehab to express subjective intensity level;Long Term:  Able to use RPE to guide intensity level when exercising independently       Able to understand and use Dyspnea scale Yes       Intervention Provide education and explanation on how to use Dyspnea scale       Expected Outcomes Short Term: Able to use Dyspnea scale daily in rehab to  express subjective sense of shortness of breath during exertion;Long Term: Able to use Dyspnea scale to guide intensity level when exercising independently       Knowledge and understanding of Target Heart Rate Range (THRR) Yes       Intervention Provide education and explanation of THRR including how the numbers were predicted and where they are located for reference       Expected Outcomes Short Term: Able to state/look up THRR;Long Term: Able to use THRR to govern intensity when exercising independently;Short Term: Able to use daily as guideline for intensity in rehab       Understanding of Exercise Prescription Yes       Intervention Provide education, explanation, and written materials on patient's individual exercise prescription       Expected Outcomes Short Term: Able to explain program exercise prescription;Long Term: Able to explain home exercise prescription to exercise independently              Exercise Goals Re-Evaluation :  Exercise Goals Re-Evaluation    Row Name 09/09/20 1633 10/08/20 1605           Exercise Goal Re-Evaluation   Exercise Goals Review Increase Physical Activity;Increase Strength and Stamina;Able to understand and use  rate of perceived exertion (RPE) scale;Able to understand and use Dyspnea scale;Knowledge and understanding of Target Heart Rate Range (THRR);Understanding of Exercise Prescription Increase Physical Activity;Increase Strength and Stamina;Able to understand and use rate of perceived exertion (RPE) scale;Able to understand and use Dyspnea scale;Knowledge and understanding of Target Heart Rate Range (THRR);Understanding of Exercise Prescription      Comments Patient has completed 4 exercise sessions and has already made workload increases. Her first day of exercise she was at 2.3 METS on the Nustep. She is able to do all exercises with no concerns. She does walk the track with a rolator due to balance issues. She is becoming independent with resistance band exercises. She is exercising at 2.7 METS on the Nustep and 2.39 METS on the track. Will continue to monitor and progress as she is able. Patient has completed 11 exercise sessions and has been steady with workload and MET level increases. She is independent with resistance training and stretches. She does still use a rolator while walking the track but we feel it is essential for her. She is exercising at 2.9 METS on level 4 on the Nustep and 2.28 METS walking the track. Will continue to monitor and progress as she is able.      Expected Outcomes Through exercise at rehab and home the patient will decrease shortness of breath with daily activities and feel confident in carrying out an exercise regimn at home. Through exercise at rehab and home the patient will decrease shortness of breath with daily activities and feel confident in carrying out an exercise regimn at home.             Discharge Exercise Prescription (Final Exercise Prescription Changes):  Exercise Prescription Changes - 10/01/20 1500      Response to Exercise   Blood Pressure (Admit) 112/60    Blood Pressure (Exit) 112/64    Heart Rate (Admit) 80 bpm    Heart Rate (Exercise) 99 bpm     Heart Rate (Exit) 86 bpm    Oxygen Saturation (Admit) 97 %    Oxygen Saturation (Exercise) 95 %    Oxygen Saturation (Exit) 97 %    Rating of Perceived Exertion (Exercise) 13    Perceived Dyspnea (Exercise) 3  Duration Continue with 30 min of aerobic exercise without signs/symptoms of physical distress.    Intensity THRR unchanged      Progression   Progression Continue to progress workloads to maintain intensity without signs/symptoms of physical distress.      Resistance Training   Training Prescription Yes    Reps 10-15    Time 10 Minutes      NuStep   Level 4    SPM 80    Minutes 15    METs 2.8      Track   Laps 9    Minutes 15           Nutrition:  Target Goals: Understanding of nutrition guidelines, daily intake of sodium <1542m, cholesterol <2060m calories 30% from fat and 7% or less from saturated fats, daily to have 5 or more servings of fruits and vegetables.  Biometrics:  Pre Biometrics - 08/23/20 1125      Pre Biometrics   Height 5' 5.25" (1.657 m)    Weight 61.8 kg    BMI (Calculated) 22.51    Grip Strength 22 kg            Nutrition Therapy Plan and Nutrition Goals:  Nutrition Therapy & Goals - 09/04/20 0957      Nutrition Therapy   Diet Generally Healthy      Personal Nutrition Goals   Nutrition Goal Pt to build a healthy plate including vegetables, fruits, whole grains, and low-fat dairy products in a heart healthy meal plan.      Intervention Plan   Intervention Prescribe, educate and counsel regarding individualized specific dietary modifications aiming towards targeted core components such as weight, hypertension, lipid management, diabetes, heart failure and other comorbidities.    Expected Outcomes Short Term Goal: Understand basic principles of dietary content, such as calories, fat, sodium, cholesterol and nutrients.           Nutrition Assessments:  MEDIFICTS Score Key:  ?70 Need to make dietary changes   40-70 Heart  Healthy Diet  ? 40 Therapeutic Level Cholesterol Diet  Flowsheet Row PULMONARY REHAB OTHER RESPIRATORY [facility] on [facility] 71     Picture Your Plate Scores:  <4<69nhealthy dietary pattern with much room for improvement.  41-50 Dietary pattern unlikely to meet recommendations for good health and room for improvement.  51-60 More healthful dietary pattern, with some room for improvement.   >60 Healthy dietary pattern, although there may be some specific behaviors that could be improved.    Nutrition Goals Re-Evaluation:  Nutrition Goals Re-Evaluation    RoPiquaame 09/04/20 [id number]/24/22 0917 10/02/20 1521         Goals   Current Weight 136 lb (61.7 kg) 136 lb 14.5 oz (62.1 kg) 135 lb 5.8 oz (61.4 kg)     Nutrition Goal Pt to build a healthy plate including vegetables, fruits, whole grains, and low-fat dairy products in a heart healthy meal plan. Pt to build a healthy plate including vegetables, fruits, whole grains, and low-fat dairy products in a heart healthy meal plan. Pt to build a healthy plate including vegetables, fruits, whole grains, and low-fat dairy products in a heart healthy meal plan.     Expected Outcome Health diet maintenance Healthy diet maintenance Healthy diet maintenance            Nutrition Goals Discharge (Final Nutrition Goals Re-Evaluation):  Nutrition Goals Re-Evaluation - 10/02/20  1521      Goals   Current Weight 135 lb 5.8 oz (61.4 kg)    Nutrition Goal Pt to build a healthy plate including vegetables, fruits, whole grains, and low-fat dairy products in a heart healthy meal plan.    Expected Outcome Healthy diet maintenance           Psychosocial: Target Goals: Acknowledge presence or absence of significant depression and/or stress, maximize coping skills, provide positive support system. Participant is able to verbalize types and ability to use techniques and  skills needed for reducing stress and depression.  Initial Review & Psychosocial Screening:  Initial Psych Review & Screening - 08/23/20 0933      Initial Review   Current issues with None Identified      Family Dynamics   Good Support System? 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      Screening Interventions   Interventions Encouraged to exercise;Provide feedback about the scores to participant           Quality of Life Scores:  Scores of 19 and below usually indicate a poorer quality of life in these areas.  A difference of  2-3 points is a clinically meaningful difference.  A difference of 2-3 points in the total score of the Quality of Life Index has been associated with significant improvement in overall quality of life, self-image, physical symptoms, and general health in studies assessing change in quality of life.  PHQ-9: Recent Review Flowsheet Data    Depression screen [facility] 2/9 08/23/2020 08/23/2020 02/22/2020   Decreased Interest 0 0 0   Down, Depressed, Hopeless 0 0 0   PHQ - 2 Score 0 0 0   Altered sleeping 0 - -   Tired, decreased energy 1 - -   Change in appetite 0 - -   Feeling bad or failure about yourself  0 - -   Trouble concentrating 0 - -   Moving slowly or fidgety/restless 0 - -   Suicidal thoughts 0 - -   PHQ-9 Score 1 - -   Difficult doing work/chores Not difficult at all - -     Interpretation of Total Score  Total Score Depression Severity:  1-4 = Minimal depression, 5-9 = Mild depression, 10-14 = Moderate depression, 15-19 = Moderately severe depression, 20-27 = Severe depression   Psychosocial Evaluation and Intervention:  Psychosocial Evaluation - 08/23/20 0936      Psychosocial Evaluation & Interventions    Interventions Encouraged to exercise with the program and follow exercise prescription    Comments Janasha does admit that when she had shingles years ago this caused her to feel anxious due to the unpredictible painful episodes she would have.  Pt takes medications and she feels this is working for her           Psychosocial Re-Evaluation:  Psychosocial Re-Evaluation    [place] Name 09/09/20 1402 10/07/20 1400           Psychosocial Re-Evaluation   Current issues with None Identified None Identified      Comments No psychosocial concerns were identified at this time. No psychosocial concerns identified at this time      Expected Outcomes For Sowmya to continue to be free of  psychosocial concerns while participating in pulmonary rehab. For Zamorah to continue to be free of psychosocial concerns while participating in pulmonary rehab.      Interventions Encouraged to attend Pulmonary Rehabilitation for the exercise Encouraged to attend Pulmonary Rehabilitation for the exercise      Continue Psychosocial Services  No Follow up required No Follow up required             Psychosocial Discharge (Final Psychosocial Re-Evaluation):  Psychosocial Re-Evaluation - 10/07/20 1400      Psychosocial Re-Evaluation   Current issues with None Identified    Comments No psychosocial concerns identified at this time    Expected Outcomes For Vanassa to continue to be free of psychosocial concerns while participating in pulmonary rehab.    Interventions Encouraged to attend Pulmonary Rehabilitation for the exercise    Continue Psychosocial Services  No Follow up required           Education: Education Goals: Education classes will be provided on a weekly basis, covering required topics. Participant will state understanding/return demonstration of topics presented.  Learning Barriers/Preferences:  Learning Barriers/Preferences - 08/23/20 3846      Learning Barriers/Preferences   Learning  Barriers Sight;Hearing;Reading    Learning Preferences Group Instruction;Individual Instruction;Verbal Instruction;Computer/Internet;Written Material;Video;Audio           Education Topics: Risk Factor Reduction:  -Group instruction that is supported by a PowerPoint presentation. Instructor discusses the definition of a risk factor, different risk factors for pulmonary disease, and how the heart and lungs work together.     Nutrition for Pulmonary Patient:  -Group instruction provided by PowerPoint slides, verbal discussion, and written materials to support subject matter. The instructor gives an explanation and review of healthy diet recommendations, which includes a discussion on weight management, recommendations for fruit and vegetable consumption, as well as protein, fluid, caffeine, fiber, sodium, sugar, and alcohol. Tips for eating when patients are short of breath are discussed.   Pursed Lip Breathing:  -Group instruction that is supported by demonstration and informational handouts. Instructor discusses the benefits of pursed lip and diaphragmatic breathing and detailed demonstration on how to preform both.     Oxygen Safety:  -Group instruction provided by PowerPoint, verbal discussion, and written material to support subject matter. There is an overview of "What is Oxygen" and "Why do we need it".  Instructor also reviews how to create a safe environment for oxygen use, the importance of using oxygen as prescribed, and the risks of noncompliance. There is a brief discussion on traveling with oxygen and resources the patient may utilize. Flowsheet Row PULMONARY REHAB OTHER RESPIRATORY from 09/12/2020 in [place]  Date 09/12/20  Educator Handout  Instruction Review Code 1- Verbalizes Understanding      Oxygen Equipment:  -Group instruction provided by [employer] Staff utilizing handouts, written materials, and Physiological scientist.   Signs and Symptoms:  -Group instruction provided by written material and verbal discussion to support subject matter. Warning signs and symptoms of infection, stroke, and heart attack are reviewed and when to call the physician/911 reinforced. Tips for preventing the spread of infection discussed.   Advanced Directives:  -Group instruction provided by verbal instruction and written material to support subject matter. Instructor reviews Advanced Directive laws and proper instruction for filling out document.   Pulmonary Video:  -Group video education that reviews the importance of medication and oxygen compliance, exercise, good nutrition, pulmonary hygiene, and pursed lip and diaphragmatic breathing for the  pulmonary patient.   Exercise for the Pulmonary Patient:  -Group instruction that is supported by a PowerPoint presentation. Instructor discusses benefits of exercise, core components of exercise, frequency, duration, and intensity of an exercise routine, importance of utilizing pulse oximetry during exercise, safety while exercising, and options of places to exercise outside of rehab.     Pulmonary Medications:  -Verbally interactive group education provided by instructor with focus on inhaled medications and proper administration.   Anatomy and Physiology of the Respiratory System and Intimacy:  -Group instruction provided by PowerPoint, verbal discussion, and written material to support subject matter. Instructor reviews respiratory cycle and anatomical components of the respiratory system and their functions. Instructor also reviews differences in obstructive and restrictive respiratory diseases with examples of each. Intimacy, Sex, and Sexuality differences are reviewed with a discussion on how relationships can change when diagnosed with pulmonary disease. Common sexual concerns are reviewed.   MD DAY -A group question and answer session with a medical doctor  that allows participants to ask questions that relate to their pulmonary disease state.   OTHER EDUCATION -Group or individual verbal, written, or video instructions that support the educational goals of the pulmonary rehab program. [place] from 09/12/2020 in [place]  Date 08/27/20  Educator handout  [My Plate nutrition handout]      Holiday Eating Survival Tips:  -Group instruction provided by PowerPoint slides, verbal discussion, and written materials to support subject matter. The instructor gives patients tips, tricks, and techniques to help them not only survive but enjoy the holidays despite the onslaught of food that accompanies the holidays.   Knowledge Questionnaire Score:  Knowledge Questionnaire Score - 08/23/20 1131      Knowledge Questionnaire Score   Pre Score 3/18           Core Components/Risk Factors/Patient Goals at Admission:  Personal Goals and Risk Factors at Admission - 08/23/20 0939      Core Components/Risk Factors/Patient Goals on Admission    Weight Management Weight Maintenance    Improve shortness of breath with ADL's Yes    Intervention Provide education, individualized exercise plan and daily activity instruction to help decrease symptoms of SOB with activities of daily living.    Expected Outcomes Short Term: Improve cardiorespiratory fitness to achieve a reduction of symptoms when performing ADLs;Long Term: Be able to perform more ADLs without symptoms or delay the onset of symptoms    Hypertension Yes    Intervention Provide education on lifestyle modifcations including regular physical activity/exercise, weight management, moderate sodium restriction and increased consumption of fresh fruit, vegetables, and low fat dairy, alcohol moderation, and smoking cessation.;Monitor prescription use compliance.    Intervention Provide education and support for participant on nutrition &  aerobic/resistive exercise along with prescribed medications to achieve LDL <49m, HDL >459m    Expected Outcomes Short Term: Participant states understanding of desired cholesterol values and is compliant with medications prescribed. Participant is following exercise prescription and nutrition guidelines.;Long Term: Cholesterol controlled with medications as prescribed, with individualized exercise RX and with personalized nutrition plan. Value goals: LDL < 7034mHDL > 40 mg.           Core Components/Risk Factors/Patient Goals Review:   Goals and Risk Factor Review    Row Name 09/09/20 1403 10/07/20 1401           Core Components/Risk Factors/Patient Goals Review   Personal Goals Review Develop more efficient breathing techniques such  as purse lipped breathing and diaphragmatic breathing and practicing self-pacing with activity.;Increase knowledge of respiratory medications and ability to use respiratory devices properly.;Improve shortness of breath with ADL's Develop more efficient breathing techniques such as purse lipped breathing and diaphragmatic breathing and practicing self-pacing with activity.;Increase knowledge of respiratory medications and ability to use respiratory devices properly.;Improve shortness of breath with ADL's      Review Fatimata is exercising at a high level on the nustep and walking track for an 80 y/o patient. Favor is progressing well for an 80 y/o.  She seems to enjoy exercising and is @ 2.9 mets on the nustep and walking 11 laps on the track in 15 minutes.  She continues to experience significant shortness of breath with activity.      Expected Outcomes See admission goals. See admission goals.             Core Components/Risk Factors/Patient Goals at Discharge (Final Review):   Goals and Risk Factor Review - 10/07/20 1401      Core Components/Risk Factors/Patient Goals Review   Personal Goals Review Develop more efficient breathing techniques such as  purse lipped breathing and diaphragmatic breathing and practicing self-pacing with activity.;Increase knowledge of respiratory medications and ability to use respiratory devices properly.;Improve shortness of breath with ADL's    Review Naavya is progressing well for an 80 y/o.  She seems to enjoy exercising and is @ 2.9 mets on the nustep and walking 11 laps on the track in 15 minutes.  She continues to experience significant shortness of breath with activity.    Expected Outcomes See admission goals.           ITP Comments:   Comments:

## 2020-10-08 NOTE — Progress Notes (Signed)
Daily Session Note  Patient Details  Name: Stacey [name] MRN: [id number] Date of Birth: 1940/10/04 Referring Provider:   April Manson Pulmonary Rehab Walk Test from 08/23/2020 in [place]  Referring Provider Brand Males, MD      Encounter Date: 10/08/2020  Check In:  Session Check In - 10/08/20 1422      Check-In   Supervising physician immediately available to respond to emergencies [facility] immediately available    Physician(s) Dr. Tawanna Solo    Location [facility]    Staff Present Rosebud Poles, RN, BSN;Lisa Ysidro Evert, RN;Jessica Hassell Done, MS, ACSM-CEP, Exercise Physiologist    Virtual Visit No    Medication changes reported     No    Fall or balance concerns reported    No    Tobacco Cessation No Change    Warm-up and Cool-down Performed on first and last piece of equipment    Resistance Training Performed Yes    VAD Patient? No    PAD/SET Patient? No      Pain Assessment   Currently in Pain? No/denies    Multiple Pain Sites No           Capillary Blood Glucose: No results found for this or any previous visit (from the past 24 hour(s)).    Social History   Tobacco Use  Smoking Status Never Smoker  Smokeless Tobacco Never Used    Goals Met:  Proper associated with RPD/PD & O2 Sat Exercise tolerated well Strength training completed today  Goals Unmet:  Not Applicable  Comments: Service time is from 5806 to 1500    Dr. Fransico Him is Medical Director for Cardiac Rehab at [facility].

## 2020-10-09 DIAGNOSIS — Z1231 Encounter for screening mammogram for malignant neoplasm of breast: Secondary | ICD-10-CM | POA: Diagnosis not present

## 2020-10-10 ENCOUNTER — Encounter (HOSPITAL_COMMUNITY): Payer: Medicare Other

## 2020-10-10 ENCOUNTER — Telehealth (HOSPITAL_COMMUNITY): Payer: Self-pay | Admitting: *Deleted

## 2020-10-10 NOTE — Telephone Encounter (Signed)
Pt called pulmonary rehab staff to let them know that she would be absent today.  With the weather and her slight headache she plans to stay home.  Stacey [name] will return on Tuesday. Cherre Huger, BSN Cardiac and Training and development officer

## 2020-10-11 DIAGNOSIS — M810 Age-related osteoporosis without current pathological fracture: Secondary | ICD-10-CM | POA: Diagnosis not present

## 2020-10-11 DIAGNOSIS — Z23 Encounter for immunization: Secondary | ICD-10-CM | POA: Diagnosis not present

## 2020-10-11 DIAGNOSIS — M0579 Rheumatoid arthritis with rheumatoid factor of multiple sites without organ or systems involvement: Secondary | ICD-10-CM | POA: Diagnosis not present

## 2020-10-14 DIAGNOSIS — Z6823 Body mass index (BMI) 23.0-23.9, adult: Secondary | ICD-10-CM | POA: Diagnosis not present

## 2020-10-14 DIAGNOSIS — Z1239 Encounter for other screening for malignant neoplasm of breast: Secondary | ICD-10-CM | POA: Diagnosis not present

## 2020-10-14 DIAGNOSIS — Z01419 Encounter for gynecological examination (general) (routine) without abnormal findings: Secondary | ICD-10-CM | POA: Diagnosis not present

## 2020-10-14 DIAGNOSIS — N952 Postmenopausal atrophic vaginitis: Secondary | ICD-10-CM | POA: Diagnosis not present

## 2020-10-14 DIAGNOSIS — Z1211 Encounter for screening for malignant neoplasm of colon: Secondary | ICD-10-CM | POA: Diagnosis not present

## 2020-10-14 DIAGNOSIS — M8580 Other specified disorders of bone density and structure, unspecified site: Secondary | ICD-10-CM | POA: Diagnosis not present

## 2020-10-15 ENCOUNTER — Other Ambulatory Visit: Payer: Self-pay

## 2020-10-15 ENCOUNTER — Encounter (HOSPITAL_COMMUNITY)
Admission: RE | Admit: 2020-10-15 | Discharge: 2020-10-15 | Disposition: A | Discharge status: Home / Self Care | Payer: Medicare Other | Source: Ambulatory Visit | Attending: Internal Medicine | Admitting: Internal Medicine

## 2020-10-15 VITALS — Wt 136.7 lb

## 2020-10-15 DIAGNOSIS — J849 Interstitial pulmonary disease, unspecified: Secondary | ICD-10-CM | POA: Diagnosis not present

## 2020-10-15 NOTE — Progress Notes (Signed)
Daily Session Note  Patient Details  Name: Stacey [name] MRN: [id number] Date of Birth: 09-May-1941 Referring Provider:   April Manson Pulmonary Rehab Walk Test from 08/23/2020 in [place]  Referring Provider Brand Males, MD      Encounter Date: 10/15/2020  Check In:  Session Check In - 10/15/20 1436      Check-In   Supervising physician immediately available to respond to emergencies [facility] immediately available    Physician(s) Dr. Tawanna Solo    [facility]    Staff Present Maurice Small, RN, BSN;Lisa Ysidro Evert, RN;Ciel Yanes Hassell Done, MS, ACSM-CEP, Exercise Physiologist    Virtual Visit No    Medication changes reported     No    Fall or balance concerns reported    No    Tobacco Cessation No Change    Warm-up and Cool-down Performed on first and last piece of equipment    Resistance Training Performed Yes    VAD Patient? No    PAD/SET Patient? No      Pain Assessment   Currently in Pain? No/denies    Pain Score 0-No pain    Multiple Pain Sites No           Capillary Blood Glucose: No results found for this or any previous visit (from the past 24 hour(s)).   Exercise Prescription Changes - 10/15/20 1600      Response to Exercise   Blood Pressure (Admit) 140/70    Blood Pressure (Exercise) 166/70    Blood Pressure (Exit) 100/50    Heart Rate (Admit) 77 bpm    Heart Rate (Exercise) 105 bpm    Heart Rate (Exit) 87 bpm    Oxygen Saturation (Admit) 99 %    Oxygen Saturation (Exercise) 93 %    Oxygen Saturation (Exit) 94 %    Rating of Perceived Exertion (Exercise) 13    Perceived Dyspnea (Exercise) 2    Duration Continue with 30 min of aerobic exercise without signs/symptoms of physical distress.    Intensity THRR unchanged      Progression   Progression Continue to progress workloads to maintain intensity without signs/symptoms of physical distress.      Resistance Training   Training  Prescription Yes    Weight orange bands    Reps 10-15    Time 10 Minutes      NuStep   Level 4    SPM 80    Minutes 15    METs 2.9      Track   Laps 12    Minutes 15    METs 2.39           Social History   Tobacco Use  Smoking Status Never Smoker  Smokeless Tobacco Never Used    Goals Met:  Proper associated with RPD/PD & O2 Sat [place] with exercise equipment Exercise tolerated well Strength training completed today  Goals Unmet:  Not Applicable  Comments: Service time is from 1355 to 1510    Dr. Fransico Him is Medical Director for Cardiac Rehab at [facility]

## 2020-10-17 ENCOUNTER — Other Ambulatory Visit: Payer: Self-pay

## 2020-10-17 ENCOUNTER — Encounter (HOSPITAL_COMMUNITY)
Admission: RE | Admit: 2020-10-17 | Discharge: 2020-10-17 | Disposition: A | Discharge status: Home / Self Care | Payer: Medicare Other | Source: Ambulatory Visit | Attending: Internal Medicine | Admitting: Internal Medicine

## 2020-10-17 DIAGNOSIS — J849 Interstitial pulmonary disease, unspecified: Secondary | ICD-10-CM

## 2020-10-17 NOTE — Progress Notes (Signed)
Daily Session Note  Patient Details  Name: Stacey [name] MRN: [id number] Date of Birth: 04-14-1941 Referring Provider:   April Manson Pulmonary Rehab Walk Test from 08/23/2020 in [place]  Referring Provider Brand Males, MD      Encounter Date: 10/17/2020  Check In:  Session Check In - 10/17/20 1439      Check-In   Supervising physician immediately available to respond to emergencies [facility] immediately available    Physician(s) Dr. Lonn [place]    Location [facility]    Staff Present Rosebud Poles, RN, BSN;Kimm Ungaro Ysidro Evert, RN;Jessica Hassell Done, MS, ACSM-CEP, Exercise Physiologist    Virtual Visit No    Medication changes reported     No    Fall or balance concerns reported    No    Tobacco Cessation No Change    Warm-up and Cool-down Performed on first and last piece of equipment    Resistance Training Performed Yes    VAD Patient? No    PAD/SET Patient? No      Pain Assessment   Currently in Pain? No/denies    Multiple Pain Sites No           Capillary Blood Glucose: No results found for this or any previous visit (from the past 24 hour(s)).    Social History   Tobacco Use  Smoking Status Never Smoker  Smokeless Tobacco Never Used    Goals Met:  Exercise tolerated well No report of cardiac concerns or symptoms Strength training completed today  Goals Unmet:  Not Applicable  Comments: Service time is from 1355 to 13    Dr. Fransico Him is Medical Director for Cardiac Rehab at [facility].

## 2020-10-18 ENCOUNTER — Other Ambulatory Visit (HOSPITAL_COMMUNITY)
Admission: RE | Admit: 2020-10-18 | Discharge: 2020-10-18 | Disposition: A | Discharge status: Home / Self Care | Payer: Medicare Other | Source: Ambulatory Visit | Attending: Internal Medicine | Admitting: Internal Medicine

## 2020-10-18 DIAGNOSIS — Z20822 Contact with and (suspected) exposure to covid-19: Secondary | ICD-10-CM | POA: Insufficient documentation

## 2020-10-18 DIAGNOSIS — Z01812 Encounter for preprocedural laboratory examination: Secondary | ICD-10-CM | POA: Diagnosis not present

## 2020-10-19 ENCOUNTER — Inpatient Hospital Stay (HOSPITAL_COMMUNITY): Admission: RE | Admit: 2020-10-19 | Payer: Medicare Other | Source: Ambulatory Visit

## 2020-10-19 LAB — SARS CORONAVIRUS 2 (TAT 6-24 HRS): SARS Coronavirus 2: NEGATIVE

## 2020-10-21 ENCOUNTER — Other Ambulatory Visit: Payer: Self-pay | Admitting: Internal Medicine

## 2020-10-21 DIAGNOSIS — J849 Interstitial pulmonary disease, unspecified: Secondary | ICD-10-CM

## 2020-10-22 ENCOUNTER — Ambulatory Visit (INDEPENDENT_AMBULATORY_CARE_PROVIDER_SITE_OTHER): Payer: Medicare Other | Admitting: Internal Medicine

## 2020-10-22 ENCOUNTER — Encounter (HOSPITAL_COMMUNITY)
Admission: RE | Admit: 2020-10-22 | Discharge: 2020-10-22 | Disposition: A | Discharge status: Home / Self Care | Payer: Medicare Other | Source: Ambulatory Visit | Attending: Internal Medicine | Admitting: Internal Medicine

## 2020-10-22 ENCOUNTER — Other Ambulatory Visit: Payer: Self-pay

## 2020-10-22 VITALS — BP 122/68 | HR 79 | Temp 97.1°F | Ht 65.0 in | Wt 136.2 lb

## 2020-10-22 DIAGNOSIS — J4540 Moderate persistent asthma, uncomplicated: Secondary | ICD-10-CM | POA: Diagnosis not present

## 2020-10-22 DIAGNOSIS — Z7185 Encounter for immunization safety counseling: Secondary | ICD-10-CM

## 2020-10-22 DIAGNOSIS — J849 Interstitial pulmonary disease, unspecified: Secondary | ICD-10-CM

## 2020-10-22 DIAGNOSIS — Z79899 Other long term (current) drug therapy: Secondary | ICD-10-CM

## 2020-10-22 DIAGNOSIS — R634 Abnormal weight loss: Secondary | ICD-10-CM | POA: Diagnosis not present

## 2020-10-22 LAB — PULMONARY FUNCTION TEST
DL/VA % pred: 80 %
DL/VA: 3.27 ml/min/mmHg/L
DLCO cor % pred: 71 %
DLCO cor: 13.8 ml/min/mmHg
DLCO unc % pred: 70 %
DLCO unc: 13.67 ml/min/mmHg
FEF 25-75 Pre: 0.97 L/sec
FEF2575-%Pred-Pre: 66 %
FEV1-%Pred-Pre: 64 %
FEV1-Pre: 1.3 L
FEV1FVC-%Pred-Pre: 102 %
FEV6-%Pred-Pre: 66 %
FEV6-Pre: 1.72 L
FEV6FVC-%Pred-Pre: 105 %
FVC-%Pred-Pre: 63 %
FVC-Pre: 1.73 L
Pre FEV1/FVC ratio: 76 %
Pre FEV6/FVC Ratio: 100 %

## 2020-10-22 LAB — HEPATIC FUNCTION PANEL
ALT: 14 U/L (ref 0–35)
AST: 18 U/L (ref 0–37)
Albumin: 3.7 g/dL (ref 3.5–5.2)
Alkaline Phosphatase: 49 U/L (ref 39–117)
Bilirubin, Direct: 0.1 mg/dL (ref 0.0–0.3)
Total Bilirubin: 0.3 mg/dL (ref 0.2–1.2)
Total Protein: 6.5 g/dL (ref 6.0–8.3)

## 2020-10-22 MED ORDER — BREO ELLIPTA 100-25 MCG/INH IN AEPB: 1.0000 | INHALATION_SPRAY | Freq: Every day | RESPIRATORY_TRACT | 5 refills | Status: DC

## 2020-10-22 NOTE — Patient Instructions (Addendum)
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 Asthma nos    -Interstitial lung disease itself  Might be and likey is progressive versus mild asthma flare after you stopped inhalers  Plan - Continue nintedanib 100 mg twice daily with food  - check LFT 10/22/2020 -Continue Rituxan and steroids through the rheumatologist - start breo 100 dosing daily once with albuterol as needed  - cotninue singulaire - restart o2 complianc at night and walking - do echo anytime now to next few weeks  - do spiro/dlco in 2 months  Drug-induced weight loss  -Resolved   Plan - Take nintedanib with food -Monitor  Encounter for long-term current use of high risk medication Advice given about COVID-19 virus infection Vaccine counseling  -s/p evusheld x 2  Plan - Probably no need for covid booster because you got evusheld and with rituxan your body will probably not respiknd to vaccine  - Have written to the evusheld coordinator to get sense of Evusheld frequency   Follow-up -2 months do spirometry and DLCO and then 30 min visit -  -Symptom score and simple walk test at follow-up

## 2020-10-22 NOTE — Progress Notes (Signed)
Brief patient profile:  80 yowf  never smoker with allergies/inhalers as child outgrew by [employer] then  RA since around 2000  Prednisone  x decades and prev eval by Dr Stacey [name] around 2004 for sob resolved s maint rx and referred 05/26/2013 by Dr Stacey [name] for bronchitis and abn cxr   History of Present Illness  05/26/2013 1st Stacey [name] visit/ Stacey [name] cc June 2014 dx pna  In [place] and remicade stopped and 100% better and placed arencia in September 2014  then abruptly worse first week in November with cough green sputum s nasal symptoms, fever low grade and no cp or cough and completely recovered prior to OV does not recall abx but issue is why keeps getting sick and abn CT Chest (see below).   Arthritis symptoms well controlled at present on Rx for RA rec Nexium 40 mg Take 30-60 min before first meal of the Stacey and add pepcid 20 mg one at bedtime whenever coughing.     10/19/2017  f/u ov/Stacey [name] re:  RA lung dz  Chief Complaint  Patient presents with  . Follow-up    Cough is much improved, but has not resolved yet. Cough is non prod. She has not had to use her neb.   Dyspnea:  Not limited by breathing from desired activities  But some doe x steps Cough: daytime > noct dry  Sleep: fine  SABA use:  No saba Medrol 4 mg a/w 2mg  per Stacey/ ok control of arthritis  rec Start back on gabapentin up to 300 mg each am  in addition to the the two at bedtime  If not better increase the medrol to 8 mg daily until bettter then taper back to where you      01/10/2018  f/u ov/Stacey [name] re:  RA  Lung dz Medrol  4 mg  One alternating with a half Chief Complaint  Patient presents with  . Follow-up    PFT's done. Her breathing has been gradually worsening since the last visit. She has occ cough- non prod.   Dyspnea gradually worse since last ov:  MMRC1 =  MMRC3 = can't walk 100 yards even at a slow pace at a flat grade s stopping due to sob    Gradually x 3 m / more fatigue / no change in  arthritis  Cough: not an issue rec Protonix 40 mg Take 30-60 min before first meal of the Stacey  GERD diet   01/17/2018 acute extended ov/Stacey [name] re: cough on medrol 4 mg  One a/w one half  Chief Complaint  Patient presents with  . Acute Visit    started coughing 01/11/18- occ prod with minimal green sputum.  She states also wheezing and having increased SOB.    abruptly worse 01/11/18 with severe 24/7 coughing >>  prod min green mucus esp in am/ assoc with subjective wheeze and did not follow previous contingencies re flutter / saba/ increase medrol and admits she does not rember those written instructions nor how to use the neb provided .  No fever/ comfortable at rest sitting  rec For cough > mucinex dm 1200 mg every 12 hours and cough into the flutter valve as much as possible  Doxycycline 100 mg twice daily x 10 days with glass of water Medrol 4mg  x 2 now and take 2  daily until cough is better then 1 daily x 5 days and then resume the previous dose  Shortness of breath/ wheezing/ still  coughing > albuterol neb every 4 hours as needed      01/20/2018 acute extended ov/Stacey [name] re: refractory cough and sob 01/11/18 Chief Complaint  Patient presents with  . Acute Visit    she is not feeling better, coughing , very SOB, wheezing  mucus now clear/scant  on doxy/ neb machine not working (tube would not plug into the side s adequate force and she was not capable of applying it due to RA hands. Cough/ wheeze/ sob 24/7 / flutter not helping/ can't lie down at hs   rec While coughing protonix 40 mg Take 30- 60 min before your first and last meals of the Stacey  Shortness of breath/ wheezing/ still coughing > albuterol neb every 4 hours as needed  Depomedrol 120 mg IM and medrol 32 mg daily x 2 days,  then 16 mg x 3 days,  Then 8 mg x 4 days , then resume the 4 mg daily  For severe cough > tylenol 3# one every 4 hours if needed  Go to ER if condition worsens on above plan       Date of admission:  01/22/2018             Date of discharge: 01/27/2018   History of present illness: As per the H and P dictated on admission, "[name] a80 y.o.female,w Rheumatoid arthritis, ILD Asthma, apparently c/o increase in dyspnea this evening. Dry cough. Denies fever, chills, cp, palp, N/v, diarrhea, brbpr, black stool. Pt notes recently being given steroid injection in [name] as well as being placed on doxycycline. This might have helped slightly but pt worse Hospital Course:  Summary of her active problems in the hospital is as following. 1 dyspnea/hypoxemia/ILD Concerned that likely GERD Is causing ILD. Patient with cough.  Patient with complaints of awakening with cough and also with oral intake which is slightly improved since 01/24/2018. assessed by speech therapy and speech therapy raising concern of esophageal component but no signs of aspiration.  2D echo with a EF of 55 to 60% with no wall motion abnormalities, grade 1 diastolic dysfunction.  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 Pulmonary felt that the patient should be on scheduled Reglan. I have placed the patient on scheduled potassium before sleep. Continue steroids on discharge continue Mucinex and Claritin as well as inhalers. Patient will follow-up with pulmonary outpatient  2. Gastroesophageal reflux disease Continue PPI and H2 blocker.I changed PPI to [facility].  3. Rheumatoid arthritis Outpatient follow-up.   4. Anxiety Continue Effexor.    All other chronic medical condition were stable during the hospitalization.  Patient was ambulatory without any assistance. On the Stacey of the discharge the patient's vitals were stable , and no other acute medical condition were reported by patient. the patient was felt safe to be discharge at home with family.  Consultants: PCCM  Procedures: Echocardiogram      03/21/2018  f/u ov/Stacey [name] re:   S/p admit was  transiently better  and downhill since Labor Stacey on medrol 4 mg daily  Last orencia on Sept 4th 2019  Chief Complaint  Patient presents with  . Acute Visit    Per patient, she has had a dry cough since July 2019. She has been wheezing as well. Increased fatigue. Body aches. Denies any fever or chills.   Dyspnea:  MMRC4  = sob if tries to leave home or while getting dressed   Cough: harsh/ hacking mostly dry/ has flutter not using  SABA use: not much better with rx   No obvious Stacey to Stacey or daytime variability or assoc excess/ purulent sputum or mucus plugs or hemoptysis or cp or chest tightness, subjective wheeze or overt sinus or hb symptoms.     Also denies any obvious fluctuation of symptoms with weather or environmental changes or other aggravating or alleviating factors except as outlined above   No unusual exposure hx or h/o childhood pna/ asthma or knowledge of premature birth.   INpatient consult 03/26/18 80 year old with rheumatoid arthoritis.At baseline the patient lives at home with her husband and is independent of ADLs.  Has been on many immune suppressants over decades and curently on orencia x 4 year and prednisone. Does not recollect being on bactrim/dapsone. Chart mentions BOOP/MAI in 2001 but she denies this. 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 Got admitted. Rx with steroids. Per Notes - clnical suspicion of  arytenoid inflmmation related wheeze noticed (she also reports asthma NOS). Follolwup with ENT recommended (but not seen one as yet). She also appears to have passed swallow with rec for regular diet with thin liquids but did to have mild eso stricture and reflux during testing . PFT shows 10% FVC decline for first ime. CT chest at this tme (aug 2019) showed new rLL infiltrate. ECHO July 2019 without evicence of  elevated PASP and saw cards [facility] June 2019 and was considered to have worsenin dyspnea due to [facility] issues (reports stress test at [facility] that was normal but I cannot see it)  She reports after discharge she got better but in last several weeks has deteriorated with cough and dyspnea. There is new hypoxemia (currently RA with nail polish and poor circulation - 89% pulse ox) needing 2L [place]. Per [facility] improved with steroids and abx. CTA 03/24/2018 => shows that RLL inifltrate has improved . Other chronic ILD changes + and small  Hiatal hernia + witthout change.  Review of lab work does show eosinophilia at time of admision  EVENTS 03/21/18 - IgE -5, blood allegy panel - negative, 03/23/2018 - - admit . HIGH EOS 2300, ESR 48, BNP 89 , HIV neg 9/12- PCT negative, RVP negative 9/14 -  PCT < 0.1, Urine strep - negative, MRSA PCR - positive. IgE - normal 4, Blood allergy panel repeat - negative 03/26/2018 - leading consideration for airway (BO in RA +/- asthma) related flare either due to MRSA bronchitis or clinical suspicion of arytenoid inflmmation +/- GERD relatd flare (she has small hiatal hernia)  +/- ? Dysphagia  up causing mild hypoxemia acute resp failure, wheeze . Allergy and IgE blood work negative thought. Patient reports being better but says she is choking on drinkin water [facility] MD says wheeze improved significantly with steroids.  RN says was down to RA yesterday evening but needed 1L [place] at sleep. Today -Room air at rest 94% and desaturated to 86% walking 60 feet 03/27/2018  - better. Off o2 at rest. STill coughs with water and when lies down.  Husband at bedside. Both requesting ILD clinic followup . Desaturated t 79% walking 90 feet.   OV 04/12/2018  Subjective:  Patient ID: Stacey [name], female , DOB: 20-Feb-1941 , age 48 y.o. , MRN: [id number] , ADDRESS: [place] [id number]   04/12/2018 -  Chief Complaint  Patient presents with  . Consult    Pt is a former  [facility] pt.  Pt denies any current complaints of cough, SOB, or CP but states the cough she originally had ended her up in the hosp 9/11-9/17 with dx acute respiratory failure. Pt does wear 2pulse with exertion and also wears 2L continuous when at home.     HPI Stacey [name] 80 y.o. -presents for follow-up to the ILD clinic.  She is known to have rheumatoid arthritis with ILD changes.  She had been followed by Dr. Christinia Gully.  However in July 2019 in September 2019 she has had 2 admissions to the hospital with respiratory distress and hypoxemic respiratory failure.  In the first 1 that seem to be right lower lobe infiltrate and then she improved from it but in the second 1 even though the right lower lobe infiltrates were better she still was hypoxemic.  Acid reflux and dysphagia was considered a possible etiology but she passed swallow study 2 times.  They thought she had some reflux.  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  At the same time it is not clear if the ILD is progressive based on pulmonary function testing below   At this point in time she tells me that she is getting home physical therapy.  Her fatigue is improving but it is not fully resolved.  She was discharged on continuous oxygen which she is using.  However she is feeling less short of breath.  Today in fact when we turned her oxygen off and walked her she did not desaturate and this is a significant improvement.  She is on monthly Orencia through the [facility] rheumatologist Dr. Eda Paschal.  At this point in time she is put the Orencia on hold.  She told me that she is been on Orencia for 4 years and never had a respiratory exacerbation still recently x 2.  Although before going on Orencia she had pneumonia while on Remicade and the Remicade.  In terms of her rheumatoid arthritis she hardly has any pain.  Her joint architecture is fairly well-preserved because of various  immunomodulators over time.  She says that she was on Remicade for years and when she stopped it for 8 weeks before the switch to [place] she never really had a relapse in her rheumatoid arthritis.  She is largely pain and stiffness free.  She believes she can go without  her Orencia for a while.  Review of the literature shows greater than 10% chance of a respiratory infection especially COPD exacerbation.  Although the time frame for this is unclear.       OV 06/07/2018  Subjective:  Patient ID: Stacey [name], female , DOB: [birth year] , age 21 y.o. , MRN: [id number] , ADDRESS: [place] [address]   06/07/2018 -   Chief Complaint  Patient presents with  . Follow-up    ILD, PFT done today, some wheezing and coughing but better tha before   Rheumatoid arthritis ILD and asthma/obstructive lung disease phenotype on Dulera  HPI Stacey [name] 80 y.o. -presents for routine follow-up with her husband.  She is here to follow-up with [facility] Dr. Stann Mainland.  She plans to do this in December 2019.  She continues to be off Orencia.  Her joints are slowly getting stiff again.  She believes that she will need to be back on immunosuppression agent again.  She  currently continues Medrol 4 mg alternating with 2 mg.  This for her rheumatoid arthritis.  In terms of her joints she continues on Medrol 4 mg alternating with 2 mg but not on any other immunosuppression agent.  Overall she is been stable but for the last 2 weeks has had green sputum and wheezing and chest congestion and cough.  She recently visited her husband who was hospitalized and walking the long hallways at [facility] made a short of breath but she thinks this is probably baseline for her.  There are no other new issues.  She did have spirometry and DLCO and this shows a decline compared to September 2019 and a similar to July 2019.  It is documented below.  This is probably reflective of a  flareup   OV 07/21/2018  Subjective:  Patient ID: Stacey [name], female , DOB: 1940/10/11 , age 27 y.o. , MRN: [id number] , ADDRESS: [place] [address]   07/21/2018 -   Chief Complaint  Patient presents with  . Follow-up    Pt states she has been doing well since last visit. States she is about to begin Rituxan with [facility]. Pt still becomes SOB with exertion. Denies any complaints of cough or CP.   Rheumatoid arthritis ILD and asthma/obstructive lung disease phenotype on Dulera  HPI Stacey [name] 80 y.o. -presents for follow-up of the above.  Last seen just before Thanksgiving 2019.  In the interim overall stable although on June 23, 2018 she climbs a steep flight of stairs which is unusual exertion for her and she became very dyspneic.  Following Stacey saw Dr. Stann Mainland at [facility] rheumatology and was given Z-Pak and prednisone and started feeling better.  Although it is not fully clear to me she had fever and bronchitic symptoms.  I reviewed Dr. Stann Mainland note.  Dr. Stann Mainland is decided to start Rituxan for rheumatoid arthritis.  She is only having some minimal joint pain at this point.  She is off Orencia and continues to be off [place].  She did have some blood work with [place] before starting Rituxan.  She is due to see Dr. Stann Mainland within the next week and start her Rituxan.  Her liver function test July 18, 2017 is normal hemoglobin is normal.  CRP is also normal.  We did spirometry and walking desaturation test.  These show improvement compared to before and these are documented below.  Currently she not using nighttime or daytime oxygen.  She is wondering if she could switch rheumatology care to [facility].  This is because while she likes [employer] she is getting older and more frail and feels some body local would be of help.  I have sent a message to Dr. Estanislado Pandy inquiring.  Certainly we can help her with Rituxan infusions at [place] system  if needed.  She will check on this with her [facility] rheumatologist.       OV 03/27/2019  Subjective:  Patient ID: Stacey [name], female , DOB: 01/13/1941 , age 69 y.o. , MRN: [id number] , ADDRESS: [place] [address]  Rheumatoid arthritis ILD and asthma/obstructive lung disease phenotype on [facility]   03/27/2019 -  Rourine fu   HPI Stacey [name] 80 y.o. -presents for the above.  Last seen in January 2020.  After that she has seen Dr. Stann Mainland rheumatology at [facility].  She is now getting Rituxan 2 doses every 6 months.  She says  this is helped her joints and her stiffness.  She is a little bit more mobile than usual.  However in terms of her respiratory status she continues to have episodic cough.  In June 2020 she again got hypoxemic and got admitted.  Since then she has had episodic cough.  She had a respiratory exacerbation in June 2020 for the admission she got steroids.  This seemed to help.  She is also on a higher dose Dulera right now.  In terms of her cough this seems to be her biggest problem.  She seems to be on Dulera, Singulair scheduled with also Tessalon and Delsym and DuoNeb.  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  Her last high-resolution CT scan of the chest was 1 year ago.  She says the dyspnea itself is not worse.  She is currently not using oxygen.  On exam she did have some wheezing.  Currently she has white and brown sputum but this is baseline.  In terms of a COVID wrist she has been tested recently couple of times and this is been negative.  She is isolating well.  She wanted to know COVID prevention activities and risk status and masking strategies.      OV 05/24/2019  Subjective:  Patient ID: Stacey [name], female , DOB: [birth year] , age 37 y.o. , MRN: [id number] , ADDRESS: [place]  [id number]   05/24/2019 -   Chief Complaint  Patient presents with  . Follow-up    Pt was recently in the hosp due to ILD. Pt states that she has been better since being out of the hosp.   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 -returns for follow-up.  At the last visit approximately 2 months ago she was reporting worsening cough following an admission in summer 2020.  Her pulmonary function test suggested worsening ILD status.  Therefore we requested a high-resolution CT chest which she did in October 2020.  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  However in the interim after the CT scan was done towards the end of October 2020 she developed worsening of her cough over 2 weeks and also associated shortness of breath but significantly the cough is much worse.  She ended up getting admitted to the hospital.  There was some hypoxemia.  By this time she had finished a ENT evaluation that did not show any involvement of the arytenoids.  Pulmonary was consulted.  She was given a prednisone burst which she just finished I believe yesterday.  She is back on her baseline Medrol.  And she is feeling better.  Her oxygen status is improved although she is using oxygen at night now.  She is really frustrated with these recurrent flareups and these admissions which ended up with her having a wheeze and also hypoxemia.  Currently she is on her baseline Medrol for rheumatoid arthritis associated with Dulera and Singulair.  She is also on losartan for blood pressure.  Her walking desaturation test is slightly worse than baseline.  She has a GI consult pending because of the recurrent episodes of cough and flareups.  We went over exposure history.  We used interstitial lung disease questionnaire for the exposure history.  Specifically she denies any  electronic cigarette use of marijuana use of cocaine use or any IV drug abuse.  [place] the last 14 years.  Asked extensive questions about the home environment it is positive for nebulizer use but the nebulizer does not have mildew or mold in it.  Otherwise no organic antigen exposure.  The house is not damp.  There is no mold or mildew in the shower curtain.  There is no humidifier use no steam iron use.  No Jacuzzi use.  No misting [place] outside to inside the house.  No pet birds.  No pet gerbils no feather pillows.  There is no mold in the [facility] duct.  She does not do any gardening.  Does not use wind instruments.  Also 122 question occupational history elicited and essentially negative.  The other issue is that she has polypharmacy.  She is asking for my help in reducing her medications.  She is on 3 medications for postherpetic neuralgia.  She is on losartan      OV 06/21/2019  Subjective:  Patient ID: Stacey [name], female , DOB: 15-Jul-1940 , age 41 y.o. , MRN: [id number] , ADDRESS: [name] [address]   06/21/2019 -   Chief Complaint  Patient presents with  . televisit    hosp 11/29-12/4 due to covid with pna. pt said that she is doing okay after recent hosp but states she has no energy.      MATSUE STROM 80 y.o. - last visit 05/24/2019. Diagnosed with covid-19 on 05/31/2019. Admitted 06/11/2019  - 06/16/2019. Quarantine ends 06/21/2019 today. Rx with Remdesviri and Decadron. Husband also on phone. Questions  1. Quarantine ends - from 06/22/19\ and she is not contagious and likely resistant to reinfection to covid for another few months  2. She is on 10 Stacey dexamethasone for covid - today is last Stacey for it  3. She is on dulera. Hospital gave combivent and she does not want do combivent - this is fine  4. Ofev for ILD - not started it yet . She had questions about side effects. Explained GI and  LFT monirtong. SHe wanted to wait till Jan 2021 and start it . I am ok with that.   5. Small hiat   IMPRESSION: HRCT OCt 2020 1. 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 Findings have progressed since 2017 and 2019 high-resolution chest CT studies. 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 Findings are consistent with UIP per consensus guidelines: Diagnosis of Idiopathic Pulmonary Fibrosis: An Official ATS/ERS/JRS/ALAT Clinical Practice Guideline. Page, Iss 5, [phone], Mar 13 2017. 2. 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 4. Small hiatal hernia.  Aortic Atherosclerosis (ICD10-I70.0).   Electronically Signed   By: Ilona Sorrel M.D.   On: 04/24/2019 13:29   OV 08/03/2019 - face to face visit  Subjective:  Patient ID: Stacey [name], female , DOB: March 25, 1941 , age 17 y.o. , MRN: [id number] , ADDRESS: [place] [id number]    Rheumatoid arthritis with Joint s/ILD- Rituxan/steroids ([facility])  - progressive phenotype (CT 2017/2019 -> Oct 2020). Planned Ofev start in Nov 2020 but did not start as of dec 2020. LFT normal Jan 2021  Athma/obstructive lung disease phenotype on [employer], production  herpetic neuralgia - on elavil, gabapentin, effexor  Coronary Artery Calcification - 1 vessel - cardiology referral done  Diagnosed with covid-19 on 05/31/2019. Admitted 06/11/2019  - 06/16/2019. Rx with Remdesviri and Decadron.  Small Hiatal hernia onCT Oct 2020  Rib fracture from fall Dec 2020: Musculoskeletal: There is nondisplaced fractures of the posterior left fifth, 6, 8, 9, and tenth left ribs. There is unchanged slight superior compression deformities of the T5, T6, T10 vertebral bodies with less than 25% loss in height.Associated  pulmnary contusion +    HPI Stacey [name] 80 y.o. -presents for a visit. Her nintedanib has been delayed because of COVID-19. Also subsequently a fall. She saw a nurse practitioner on July 20, 2019 and they took a shared decision to start nintedanib. She had a ? telephone visit with Dr. Candie Mile the rheumatologist at [facility]. Patient scheduled for her next Rituxan in February 2021 but reviewed the notes indicates this could be pushed to early spring 2021. Dr. Stann Mainland is okay with the patient study got intubated at this point in time.  Patient has follow-up with Dr. Stann Mainland tomorrow at [facility].  At this face-to-face visit she is here with her husband.  She says she is much better after the Covid and also the fall that fractured her ribs.  Nevertheless all this is left her fatigue.  Infective fatigue scores are much worse.  Also in the last 2 weeks has had increased cough wheezing and shortness of breath.  The sputum was also changed color in the last 1 week to clear green.  This is all new.  Her symptom score is therefore a worse than her baseline.  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  She is only taking 100 mg once a Stacey.  The plan was to go up to 100 mg twice a Stacey next week.  This is a minimum effective dose.  The maximum dose is 150 mg twice a Stacey.  Given her age and comorbidities we are starting at a low dose.  Part of this visit is to make sure that she is tolerating the drug fine.  And so far she is.  She is interested in the ILD-pro registry study done by the [facility].   She is working with physical therapy for her fatigue.  Ambulatory Walk 07/20/2019 2 Lap- O2 92% RA; HR 109 No shortness of breath She did not need to stop Used walker    OV 09/20/2019  Subjective:  Patient ID: Stacey [name], female , DOB: 1940/10/16 , age 61 y.o. , MRN: [id number] , ADDRESS: [place] [address]   09/20/2019 -   Chief Complaint  Patient presents with  . Televisit    Called and spoke with pt who stated she has been feeling okay since last visit. Pt stated she is still taking OFEV and denies any complaints. Pt states she believes her breathing is stable at this point.    Rheumatoid arthritis with Joint s/ILD- Rituxan/steroids ([facility] uinviesty)  - progressive phenotype (CT 2017/2019 -> Oct 2020). Planned Ofev start in Nov 2020 but did not start as of dec 2020. 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 Admitted 06/11/2019  - 06/16/2019. Rx with Remdesviri and Decadron.  Small Hiatal hernia on CT Oct 2020  Rib fracture from fall Dec 2020: Musculoskeletal: There is nondisplaced fractures of the posterior left fifth, 6, 8, 9, and tenth left ribs. There is unchanged slight superior compression deformities of the T5, T6, T10 vertebral bodies with less than 25% loss in height.Associated pulmnary contusion +   HPI Stacey [name] 80 y.o. - has 2nd covid vaccine later this week. Rituxan is end of the month. Baseline RA regiment is On dulera for associated obstructio of lung. Continue ofev 124m bid since mid jan 2021 for ILD. No side effects. Did see BDerl Barrowfor face to face visit early feb 2021 -> for bronchitis and felt better after prdnisone and zpak. Currently on medrol   8 mg Stacey and staying there per BDerl Barrow. Wants t oknow if she can taper. Wants to know how she can prevent care flare ups. Explained ofev, masking and  social distancing prevent respiratory infection.Denies choking on food or aspirating. Denies mold in house - relatively news. Denies dog. Denies cat. Denies carious teeth. Denies post nasal drip  OVerll feels better and improved dyspnea.     OV 12/18/2019  Subjective:  Patient ID: MTamala [name] female , DOB: 6November 02, 1942, age 80y.o. , MRN: [id number], [place] [id number] PCP RShon Baton MD Rheumatologist-Dr. JEda Paschalat [facility] IncP[facility]: Dr. RVeryl Speak 12/18/2019 -   Chief Complaint  Patient presents with  . Follow-up    no worse   Rheumatoid arthritis with Joint s/ILD- Rituxan/steroids (duke uinviesty)  - progressive phenotype (CT 2017/2019 -> Oct 2020). Planned Ofev start in Nov 2020 but did not start as of dec 2020. 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 Admitted 06/11/2019  - 06/16/2019. Rx with Remdesviri and Decadron.  Small Hiatal hernia on CT Oct 2020  Rib fracture from fall Dec 2020: Musculoskeletal: There is nondisplaced fractures of the posterior left fifth, 6, 8, 9, and tenth left ribs. There is unchanged slight superior compression deformities of the T5, T6, T10 vertebral bodies with less than 25% loss in height.Associated pulmnary contusion +  -On Reclast once a year through Dr. JennEda PaschalPI MargTamala Fothergilly68. 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  It has been a few month since I last saw her.  In this time she did not taper  her Medrol down.  She stated 8 mg/Stacey.  She was originally taking the Medrol for her rheumatoid arthritis for many years.  She recently increased the dose from 4 mg / 2 mg every other Stacey to 8 mg daily because of recurrent respiratory exacerbations.  She  is asking for [facility] refills for obstructive lung disease.  She is getting Rituxan through Dr. Eda Paschal for rheumatoid arthritis.  Her next Rituxan is in September 2021.  She saw Dr. Stann Mainland in April 2021 and reviewed the note.  Her liver function test at the time was fine.  In terms of her ILD she continues on nintedanib 100 mg twice daily.  She says she has no side effects from the drug she is tolerating it quite well.  We discussed about increasing the dose but she wants to hold off because she just got a new supply.  However she is open to increasing the dose when the supply runs out.  She is due for liver function test today.  In terms of overall symptoms things have improved as can be seen below and the symptom score.Marland Kitchen  However her main concern is that of fatigue.  She is open to attending pulmonary rehabilitation.  She is not using oxygen at home.  Sometime back when we tested overnight oxygen desaturation test she did not desaturate.  Walking desaturation test today stable.  She does notice of note that her blood pressure has gone up.  She is working with her primary care physician on this.  She is on losartan.    OV 04/10/2020   Subjective:  Patient ID: Stacey [name], female , DOB: August 26, 1940, age 38 y.o. years. , MRN: [id number],  ADDRESS: [address] [place] [address] PCP  Shon Baton, MD Providers : Treatment Team:  Attending Provider: Brand Males, MD   Chief Complaint  Patient presents with  . Follow-up    CT scan 8/23, has not increased Ofev, shortness of breath with exertion.    Rheumatoid arthritis with Joint s/ILD- Rituxan/steroids (duke uinviesty)  - progressive phenotype (CT 2017/2019 -> Oct  2020). Planned Ofev start in Nov 2020 but did not start as of dec 2020. 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 Admitted 06/11/2019  - 06/16/2019. Rx with Remdesviri and Decadron.  Admitted August 2021 for respiratory infection not otherwise specified.  Following trip to [place].  Was hypoxemic.  Small Hiatal hernia on CT Oct 2020  Rib fracture from fall Dec 2020: Musculoskeletal: There is nondisplaced fractures of the posterior left fifth, 6, 8, 9, and tenth left ribs. There is unchanged slight superior compression deformities of the T5, T6, T10 vertebral bodies with less than 25% loss in height.Associated pulmnary contusion +  -On Reclast once a year through Dr. Eda Paschal    HPI Stacey [name] 80 y.o. -returns for follow-up.  Last week by myself early June 2021.  Then after that in early August 2020 when she got admitted for respiratory infection not otherwise specified.  She was hypoxemic on room air.  This followed a trip to [place].  In the follow-up phase end of August 2020 when she had high-resolution CT chest that showed increased groundglass opacities but when compared to a CT from 8 months  ago.  Also chronic ILD had worsened.  I personally visualized the image at this point in time she is improved from this admission she is not using oxygen at all.  In fact walking desaturation test shows near baseline.  Nevertheless overall her symptom scores have declined over time ILD  symptom score is listed below.  She is really concerned about several issues.  In terms of her ILD she was inquiring about escalating to 150 mg twice daily.  Even at 100 mg twice daily she is significantly symptomatic in terms of nonrespiratory issues such as nausea and dizziness and fatigue.  I did caution her that these could all get worse.  Therefore she is just opted to be at 100 mg twice daily  -Recurrent admissions for respiratory issues [I did address that some of this was Covid and fall related and not necessarily her usual summer exacerbation].  Nevertheless, I asked about exposures at home.  She does not have a feather blanket or feather pillow or feather jacket.  She denies any mold or bird exposure or mildew exposure at home.  Does not do any gardening or wind instruments.  She is immunosuppressed and I did notice that she is not on Bactrim for PCP prophylaxis.  In 2019 when we checked G6PD the lab could not process this lab.  She is interested in Bactrim prophylaxis.  She is interested in okay getting G6PD rechecked   -She is dealing with significant amount of dizziness.  Neurology is sending her to neuro rehab.  It is believed it is multifactorial.  I reviewed her recent CT head angio report.  I personally visualized the image   -She is also worried about cough.  Currently cough is tolerable but she says prior to her exacerbations cough gets worse.  We did discuss with her that if she is aspirating which she denied.  We did discuss about the possibility of starting Bactrim and monitoring her cough and she is fine with that.  I did review old records Lungs/Pleura: Biapical pleuroparenchymal scarring. Patchy and largely peripheral peribronchovascular ground-glass, increased from 07/03/2019. Underlying subpleural reticulation, traction bronchiectasis/bronchiolectasis and scattered honeycombing, similar to minimally increased from 07/03/2019. Calcified granulomas. No pleural fluid. Airway  is unremarkable. No air trapping.  Upper Abdomen: Visualized portions of the liver, adrenal glands, kidneys, spleen, pancreas, stomach and bowel are unremarkable with the exception of a small hiatal hernia. Cholecystectomy. Calcified upper abdominal lymph nodes.  Musculoskeletal: Degenerative changes in the spine. No worrisome lytic or sclerotic lesions.  IMPRESSION: 1. Increased patchy pulmonary parenchymal ground-glass may be due to an atypical/viral pneumonia, including due to COVID-19. 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 2. Aortic atherosclerosis (ICD10-I70.0). Coronary artery calcification.   Electronically Signed   By: Lorin Picket M.D.   On: 03/04/2020 12:58     Results for KYNSLI, HAAPALA (MRN [id number]) as of 03/27/2019 09:43  Ref. Range 07/17/2013 09:53 02/07/2016 13:09 01/11/2017 11:05 01/10/2018 10:13 05/17/2018 14:24 06/07/2018 10:57 07/21/2018 10:53 03/27/2019 09:02 6/1 04/08/20  FVC-Pre Latest Units: L 2.13 2.16 2.14 1.93 1.99 1.81 2.18 1.79 1.90 1.83L  FVC-%Pred-Pre Latest Units: % 68 71 71 65 75 68 77 64  66$   Results for KHANIYA, TENAGLIA (MRN [id number]) as of 03/27/2019 09:43  Ref. Range 07/17/2013 09:53 02/07/2016 13:09 01/11/2017 11:05 01/10/2018 10:13 05/17/2018 14:24 06/07/2018 10:57 07/21/2018 10:53 03/27/2019 09:02 6/1 04/08/20  DLCO unc Latest Units: ml/min/mmHg 18.85 18.25 16.50 16.09 16.58 14.06  15.04 13.91 15.49  DLCO unc % pred Latest Units: % 69 67 61 59 70 59  77 71% 79%    OV 08/01/2020  Subjective:  Patient ID: Stacey [name], female , DOB: 1940-11-17 , age 50 y.o. , MRN: [id number] , ADDRESS: [place] [address] PCP Shon Baton, MD Patient Care Team: Shon Baton, MD as PCP - General (Internal Medicine) Buford Dresser, MD as PCP -  Cardiology (Cardiology)  This Provider for this visit: Treatment Team:  Attending Provider: Brand Males, MD   Rheumatoid arthritis with Joint s/ILD- Rituxan/steroids ([facility] [place] Course)  - progressive phenotype (CT 2017/2019 -> Oct 2020). Planned Ofev start in Nov 2020 but did not start as of dec 2020. 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 Admitted 06/11/2019  - 06/16/2019. Rx with Remdesviri and Decadron.  Admitted August 2021 for respiratory infection not otherwise specified.  Following trip to [place].  Was hypoxemic.  Small Hiatal hernia on CT Oct 2020  Rib fracture from fall Dec 2020: Musculoskeletal: There is nondisplaced fractures of the posterior left fifth, 6, 8, 9, and tenth left ribs. There is unchanged slight superior compression deformities of the T5, T6, T10 vertebral bodies with less than 25% loss in height.Associated pulmnary contusion +  -On Reclast once a year through Dr. Eda Paschal    08/01/2020 -   Chief Complaint  Patient presents with  . Follow-up    Doing well     HPI Stacey [name] 80 y.o. -returns for follow-up.  She says since last visit she has lost weight.  In fact tracking her weight it appears she is definitely lost weight.  She has occasional intermittent nausea once every few weeks this is because she does not time her nintedanib well with food.  She has a low appetite as well.  She tried to go up on the Internet but she is unable to.   She takes a low-dose 100 mg twice daily.  She is on Rituxan and prednisone.  She is up-to-date with her COVID-vaccine.  Current shortness of breath standpoint she is stable.  Symptoms are stable.  CT Chest data  OV 10/22/2020  Subjective:  Patient ID: Stacey [name], female , DOB: 1941/02/08 , age 59 y.o. , MRN: [id number] , ADDRESS: [address] [place] [address] PCP Shon Baton, MD Patient Care Team: Shon Baton, MD as PCP - General (Internal Medicine) Buford Dresser, MD as PCP - Cardiology (Cardiology)  This Provider for this visit: Treatment Team:  Attending Provider: Brand Males, MD    10/22/2020 -   Chief Complaint  Patient presents with  . Follow-up    Still having shortness of breath with activity, denies increase in severity.     Rheumatoid arthritis with Joint s/ILD- Rituxan/steroids (duke uinviesty)  - progressive phenotype (CT 2017/2019 -> Oct 2020). Planned Ofev start in Nov 2020 but did  not start as of dec 2020. 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 Admitted 06/11/2019  - 06/16/2019. Rx with Remdesviri and Decadron.  - sp evushed x 2  Admitted August 2021 for respiratory infection not otherwise specified.  Following trip to [place].  Was hypoxemic.  Small Hiatal hernia on CT Oct 2020  Rib fracture from fall Dec 2020: Musculoskeletal: There is nondisplaced fractures of the posterior left fifth, 6, 8, 9, and tenth left ribs. There  is unchanged slight superior compression deformities of the T5, T6, T10 vertebral bodies with less than 25% loss in height.Associated pulmnary contusion +  -On Reclast once a year through Dr. Eda Paschal  Normal ECHO Summer 2020   HPI Stacey [name] 80 y.o. -returns for follow-up.  At this point in time for her ILD she is taking nintedanib 100 mg twice daily.  Last liver function test was in December 2021.  She is now attending pulmonary rehabilitation.  She is not sure it is helping her.  She tells me that she does not desaturate at pulmonary rehabilitation so she not using her oxygen.  Review of the records indicate same but it appears that today her dyspnea is worse in terms of symptom score although she is denying that.  Walking desaturation test in the [name] today with a forehead probe suggest that pulse ox is declined.  Her pulmonary function test also shows 5% FVC decline.  She was surprised by this.  In talking to her realize that for coexistent obstructive lung disease she is no longer taking Dulera.  She is willing to take inhaler which she will have to twist instead of having to pump with her fingers because she has rheumatoid arthritis.  She is no longer losing weight though.  She had questions about taking monoclonal antibody prophylaxis.  She is under the impression she needs to take it every 6 months with Rituxan dosing.  I did explain to her that in the presence of Rituxan the body is not able to respond actively to vaccine and that is why she is having prophylactic antibody.  She is now had 2 doses the last 1 being a few weeks ago first 1 was in January 2022.  I have written to the [facility] coordinator Wilber Bihari 27 monoclonal antibody prophylaxis schedule.  Expressed to patient that because of monoclonal antibody prophylaxis she did not do the vaccine.  Last echocardiogram was in summer 2020.      SYMPTOM SCALE - ILD 03/27/2019  05/24/2019 (2-3 weeks pre cpovid)   08/03/2019 Post covid Post fall rib # 12/18/2019  04/10/2020 142# resp virus admit aug 2021 nos 08/01/2020 134#  10/22/2020 136# Now doing rehab  O2 use ra        Shortness of Breath 0 -> 5 scale with 5 being worst (score 6 If unable to do)        At rest 0 $Remo'2 3 2 3 'QHuBB$ 0 3  Simple tasks -  showers, clothes change, eating, shaving 0 $RemoveB'3 2 3 4 1 3  'oqUIgGnY$ Household (dishes, doing bed, laundry) $RemoveBefor'2 4 5 3 4 3 3  'RezkumWCCzsC$ Shopping $RemoveBefo'2 3 6 4 4 4 4  'nXwfoSFZvYI$ Walking level at own pace $Remove'2 3 4 4 3 2 4  'VdZHCYx$ Walking up Stairs $RemoveB'3 3 5 4 4 4 4  'qIqwsgnR$ Total (40 - 48) Dyspnea Score $RemoveBefor'9 17 25 20 22 14 21  'NRyuFzRqHQDS$ How bad is your cough? $RemoveBef'2 2 3 1 2 'gvOOaoOKKU$ 0 0  How bad is your fatigue 3 3.5 5 3.$Remov'5 3 4 3  'AFkUCg$ nause    0 1 1 0  vomit    0 1 1 0  diarrhea    0 1 0 0  anxiety    0 2 0 1  depression    0 1 0 0     Simple [name] walk  feet x  3 laps goal with forehead probe 04/12/2018  07/21/2018  05/24/2019  12/18/2019  04/10/2020  08/01/2020  10/22/2020   O2 used Room air - off o2 x 10 min Room air  Room air ra ra ra ra  Number laps completed 3 x 185 feet 3 x 250 feet    r   Comments about pace normal normal   Slow, no walker Slow . No walker.  Slow. No walker. No cane  Resting Pulse Ox/HR 99% and 72/min 97% and 74/min 97% and 81/min 97% and 76/m 96% and 88/min 94% and 83 96% and HR 79  Final Pulse Ox/HR 98% and 95/min 96% and 96/min 94% and 107/min 93% and 94 92% and 97 90% and 100 89% and 104/min  Desaturated </= 88% no no no no no yes yes  Desaturated <= 3% points no no Yes, 3 pints Ues, 4 points Yes,  4 points Yes, 4 points Yes, 7 points  Got Tachycardic >/= 90/min yes yes yes yes yes yes yes  Symptoms at end of test No complaint Mild dyspnea Mod dyspnea Some dyspnea dyspnea dyspnie cat 3d Dyspneic moderate   Miscellaneous comments improed from hospital Same v improved woprse same  Wobbly gait baseline Needed 2 break       PFT  PFT Results Latest Ref Rng & Units 10/22/2020 09/10/2020 04/08/2020 12/12/2019 03/27/2019 07/21/2018 06/07/2018  FVC-Pre L 1.73 1.76 1.83 1.90 1.79  2.18 1.81  FVC-Predicted Pre % 63 64 66 68 64 77 68  FVC-Post L - - - - - - -  FVC-Predicted Post % - - - - - - -  Pre FEV1/FVC % % 76 75 74 78 79 79 69  Post FEV1/FCV % % - - - - - - -  FEV1-Pre L 1.30 1.32 1.35 1.48 1.42 1.72 1.25  FEV1-Predicted Pre % 64 64 66 71 68 81 63  FEV1-Post L - - - - - - -  DLCO uncorrected ml/min/mmHg 13.67 15.38 14.39 13.91 15.04 - 14.06  DLCO UNC% % 70 79 74 71 77 - 59  DLCO corrected ml/min/mmHg 13.80 15.38 15.49 13.91 - - -  DLCO COR %Predicted % 71 79 79 71 - - -  DLVA Predicted % 80 102 108 86 96 - 80  TLC L - - - - - - -  TLC % Predicted % - - - - - - -  RV % Predicted % - - - - - - -       has a past medical history of Abnormal finding of blood chemistry, Asthma, H/O measles, H/O varicella,  Hypertension, 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[place]), 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   reports that she has never smoked. She has never used smokeless tobacco.  Past Surgical History:  Procedure Laterality Date  . CHOLECYSTECTOMY  2011  . IR ANGIO INTRA EXTRACRAN SEL COM CAROTID INNOMINATE BILAT MOD SED  06/18/2020  . IR ANGIO VERTEBRAL SEL SUBCLAVIAN INNOMINATE UNI R MOD SED  06/18/2020  . IR ANGIO VERTEBRAL SEL VERTEBRAL UNI L MOD SED  06/18/2020  . WISDOM TOOTH EXTRACTION      Allergies  Allergen Reactions  . Penicillins Other (See Comments)    Reaction unknown occurred during childhood Has patient had a PCN reaction causing immediate rash, facial/tongue/throat swelling, SOB or lightheadedness with hypotension: Unknown Has patient had a PCN reaction causing severe rash involving mucus membranes or skin necrosis: Unknown Has patient had a PCN reaction that required hospitalization: No Has patient had a PCN reaction occurring within the last 10 years: No If all of the above answers  are "NO", then may proceed with Cephalosporin use.  Unsur  . Remicade [Infliximab] Other (See Comments)    Reaction unknown  . Sulfa Antibiotics Other (See Comments)    Reaction unknown    Immunization History  Administered Date(s) Administered  . Fluad Quad(high Dose 65+) 04/23/2020  . Influenza Split 04/12/2013, 04/12/2014, 03/14/2015, 04/23/2016  . Influenza, High Dose Seasonal PF 04/12/2017, 03/24/2018, 04/13/2019  . Influenza-Unspecified 04/03/2013, 04/22/2017  . Moderna Sars-Covid-2 Vaccination 08/25/2019, 09/22/2019, 03/07/2020  . Pneumococcal Conjugate-13 04/03/2013, 09/28/2014  . Pneumococcal Polysaccharide-23 07/13/2012, 07/24/2013  . Td 01/11/2018  . Tdap 12/10/2017    Family History  Problem Relation Age of Onset  . Asthma Mother   . Anemia Mother   . Polymyalgia rheumatica Mother   . COPD Father   . Pulmonary fibrosis Father   . Breast cancer Maternal Grandmother 66     Current Outpatient Medications:  .  albuterol (VENTOLIN HFA) 108 (90 Base) MCG/ACT inhaler, Inhale 2 puffs into the lungs every 6 (six) hours as needed for wheezing or shortness of breath., Disp: , Rfl:  .  amitriptyline (ELAVIL) 25 MG tablet, Take 25 mg by mouth at bedtime., Disp: , Rfl: 0 .  benzonatate (TESSALON) 200 MG capsule, Take 1 capsule (200 mg total) by mouth 3 (three) times daily as needed for cough., Disp: 30 capsule, Rfl: 2 .  beta carotene w/minerals (OCUVITE) tablet, Take 1 tablet by mouth daily., Disp: , Rfl:  .  Biotin 1000 MCG tablet, Take 1,000 mcg by mouth daily., Disp: , Rfl:  .  cholecalciferol (VITAMIN D) 1000 UNITS tablet, Take 1,000 Units by mouth daily., Disp: , Rfl:  .  Estradiol 10 MCG TABS vaginal tablet, Place 10 mcg vaginally daily as needed (dryness). , Disp: , Rfl:  .  gabapentin (NEURONTIN) 300 MG capsule, Take 600 mg by mouth at bedtime., Disp: , Rfl:  .  lactulose (CHRONULAC) 10 GM/15ML solution, Take 45 mLs (30 g total) by mouth 2 (two) times daily., Disp:  1892 mL, Rfl: 1 .  losartan (COZAAR) 50 MG tablet, Take 1 tablet (50 mg total) by mouth daily., Disp: 30 tablet, Rfl: 1 .  melatonin 5 MG TABS, Take 1 tablet (5 mg total) by mouth at bedtime., Disp: 30 tablet, Rfl: 0 .  methylPREDNISolone (MEDROL) 4 MG tablet, Take 1 tablet (4 mg total) by mouth every other [place]. Alternate with the $Remove'2mg'xrVndUD$  dose (  Patient taking differently: Take 4 mg by mouth daily.), Disp:  , Rfl:  .  OFEV 100 MG CAPS, TAKE 1 CAPSULE BY MOUTH 2 TIMES DAILY., Disp: 180 capsule, Rfl: 1 .  polyethylene glycol (MIRALAX / GLYCOLAX) 17 g packet, Take 17 g by mouth 2 (two) times daily. (Patient taking differently: Take 17 g by mouth daily as needed for moderate constipation.), Disp: 72 each, Rfl: 1 .  pravastatin (PRAVACHOL) 20 MG tablet, Take 1 tablet (20 mg total) by mouth every evening., Disp: 90 tablet, Rfl: 3 .  riTUXimab (RITUXAN) 100 MG/10ML injection, Inject 100 mg into the vein every 6 (six) months. , Disp: , Rfl:  .  senna-docusate (SENOKOT-S) 8.6-50 MG tablet, Take 2 tablets by mouth 2 (two) times daily. (Patient taking differently: Take 1 tablet by mouth daily as needed for mild constipation.), Disp: 120 tablet, Rfl: 1 .  sulfamethoxazole-trimethoprim (BACTRIM DS) 800-160 MG tablet, Take 1 tablet by mouth 3 (three) times a week. Take on Mon, Wed, Fri (Patient taking differently: Take 1 tablet by mouth every Monday, Wednesday, and Friday.), Disp: 12 tablet, Rfl: 5 .  venlafaxine XR (EFFEXOR-XR) 75 MG 24 hr capsule, Take 75 mg by mouth at bedtime. , Disp: , Rfl:  .  ipratropium-albuterol (DUONEB) 0.5-2.5 (3) MG/3ML SOLN, Take 3 mLs by nebulization 3 (three) times daily for 5 days. (Patient taking differently: Take 3 mLs by nebulization every 4 (four) hours as needed (shortness of breath). ), Disp: 45 mL, Rfl: 1 .  mometasone-formoterol (DULERA) 100-5 MCG/ACT AERO, Inhale 2 puffs into the lungs 2 (two) times daily. (Patient not taking: Reported on 10/22/2020), Disp: 39 g, Rfl: 0 .   pantoprazole (PROTONIX) 40 MG tablet, Take 1 tablet (40 mg total) by mouth 2 (two) times daily before a meal. (Patient not taking: Reported on 10/22/2020), Disp: 180 tablet, Rfl: 3      Objective:   Vitals:   10/22/20 0933  BP: 122/68  Pulse: 79  Temp: (!) 97.1 F (36.2 C)  TempSrc: Temporal  SpO2: 96%  Weight: 136 lb 3.2 oz (61.8 kg)  Height: $Remove'5\' 5"'VMYwikY$  (1.651 m)    Estimated body mass index is 22.66 kg/m as calculated from the following:   Height as of this encounter: $RemoveBeforeD'5\' 5"'szWynGjMBmrQhU$  (1.651 m).   Weight as of this encounter: 136 lb 3.2 oz (61.8 kg).  $Rem'@WEIGHTCHANGE'PHZR$ @  [employer]   10/22/20 0933  Weight: 136 lb 3.2 oz (61.8 kg)     Physical Exam  General: No distress.  Frail female.  Looks the same Neuro: Alert and Oriented x 3. GCS 15. Speech normal Psych: Pleasant Resp:  Barrel Chest - no.  Wheeze - no, Crackles -crackles mixed with squeaks at lung base, No overt respiratory distress CVS: Normal heart sounds. Murmurs - no Ext: Stigmata of Connective Tissue Disease - no HEENT: Normal upper airway. PEERL +. No post nasal drip        Assessment:       ICD-10-CM   1. ILD (interstitial lung disease) ([place])  J84.9   2. Encounter for long-term current use of high risk medication  Z79.899   3. Moderate persistent asthma without complication  W29.93   4. Vaccine counseling  Z71.85   5. 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 Asthma nos    -Interstitial lung disease itself  Might  be and likey is progressive versus mild asthma flare after you stopped inhalers  Plan - Continue nintedanib 100 mg twice daily with food  - check LFT 10/22/2020 -Continue Rituxan and steroids through the rheumatologist - start breo 100 dosing daily once with albuterol as needed  - cotninue singulaire - restart o2 complianc at night and walking - do echo anytime now to next few  weeks  - do spiro/dlco in 2 months  Drug-induced weight loss  -Resolved   Plan - Take nintedanib with food -Monitor  Encounter for long-term current use of high risk medication Advice given about COVID-19 virus infection Vaccine counseling  -s/p evusheld x 2  Plan - Probably no need for covid booster because you got evusheld and with rituxan your body will probably not respiknd to vaccine  - Have written to the evusheld coordinator to get sense of Evusheld frequency   Follow-up -2 months do spirometry and DLCO and then 30 min visit -  -Symptom score and simple walk test at follow-up     SIGNATURE    Dr. Brand Males, M.D., F.C.C.P,  Pulmonary and Critical Care Medicine Staff Physician, [place] Director - Interstitial Lung Disease  Program  Pulmonary [facility] at [place], [place], [address]  Pager: [phone], If no answer or between  15:00h - 7:00h: call [phone] Telephone: [phone]  10:45 AM 10/22/2020

## 2020-10-22 NOTE — Progress Notes (Signed)
Spirometry and Dlco done today. 

## 2020-10-22 NOTE — Progress Notes (Signed)
Daily Session Note  Patient Details  Name: Stacey [name] MRN: [id number] Date of Birth: [birth year] Referring Provider:   April Manson Pulmonary Rehab Walk Test from 08/23/2020 in [place]  Referring Provider Brand Males, MD      Encounter Date: 10/22/2020  Check In:  Session Check In - 10/22/20 1434      Check-In   Supervising physician immediately available to respond to emergencies [facility] immediately available    Physician(s) Dr. Tawanna Solo    Location [facility]    Staff Present Rosebud Poles, RN, Milus Glazier, MS, EP-C, CCRP;Jessica Hassell Done, MS, ACSM-CEP, Exercise Physiologist    Virtual Visit No    Medication changes reported     No    Fall or balance concerns reported    No    Tobacco Cessation No Change    Warm-up and Cool-down Performed on first and last piece of equipment    Resistance Training Performed Yes    VAD Patient? No    PAD/SET Patient? No      Pain Assessment   Currently in Pain? No/denies    Multiple Pain Sites No           Capillary Blood Glucose: Results for orders placed or performed in visit on 10/22/20 (from the past 24 hour(s))  Hepatic function panel     Status: None   Collection Time: 10/22/20 11:00 AM  Result Value Ref Range   Total Bilirubin 0.3 0.2 - 1.2 mg/dL   Bilirubin, Direct 0.1 0.0 - 0.3 mg/dL   Alkaline Phosphatase 49 39 - 117 U/L   AST 18 0 - 37 U/L   ALT 14 0 - 35 U/L   Total Protein 6.5 6.0 - 8.3 g/dL   Albumin 3.7 3.5 - 5.2 g/dL      Social History   Tobacco Use  Smoking Status Never Smoker  Smokeless Tobacco Never Used    Goals Met:  Proper associated with RPD/PD & O2 Sat Exercise tolerated well Strength training completed today  Goals Unmet:  Not Applicable  Comments: Service time is from 1345 to 1500    Dr. Fransico Him is Medical Director for Cardiac Rehab at [facility].

## 2020-10-22 NOTE — Progress Notes (Signed)
LFT normal on ofev. Will not call with reuslts  Lab             10/22/20                      1100         AST          18           ALT          14           ALKPHOS      49           BILITOT      0.3          PROT         6.5          ALBUMIN      3.7

## 2020-10-24 ENCOUNTER — Encounter (HOSPITAL_COMMUNITY)
Admission: RE | Admit: 2020-10-24 | Discharge: 2020-10-24 | Disposition: A | Discharge status: Home / Self Care | Payer: Medicare Other | Source: Ambulatory Visit | Attending: Internal Medicine | Admitting: Internal Medicine

## 2020-10-24 ENCOUNTER — Other Ambulatory Visit: Payer: Self-pay

## 2020-10-24 DIAGNOSIS — J849 Interstitial pulmonary disease, unspecified: Secondary | ICD-10-CM | POA: Diagnosis not present

## 2020-10-24 NOTE — Progress Notes (Signed)
Daily Session Note  Patient Details  Name: APARNA VANDERWEELE MRN: [id number] Date of Birth: [birth year] Referring Provider:   April Manson Pulmonary Rehab Walk Test from 08/23/2020 in [place]  Referring Provider Brand Males, MD      Encounter Date: 10/24/2020  Check In:  Session Check In - 10/24/20 1437      Check-In   Supervising physician immediately available to respond to emergencies [facility] immediately available    Physician(s) Dr. Florencia Reasons    Location [facility]    Staff Present Rosebud Poles, RN, BSN;Lisa Ysidro Evert, RN;Elinor Kleine Hassell Done, MS, ACSM-CEP, Exercise Physiologist    Virtual Visit No    Medication changes reported     No    Fall or balance concerns reported    No    Tobacco Cessation No Change    Warm-up and Cool-down Performed on first and last piece of equipment    Resistance Training Performed No    VAD Patient? No    PAD/SET Patient? No      Pain Assessment   Currently in Pain? No/denies    Multiple Pain Sites No           Capillary Blood Glucose: No results found for this or any previous visit (from the past 24 hour(s)).    Social History   Tobacco Use  Smoking Status Never Smoker  Smokeless Tobacco Never Used    Goals Met:  Proper associated with RPD/PD & O2 Sat Improved SOB with ADL's Exercise tolerated well Completed pulmonary undergrad program   Goals Unmet:  Not Applicable  Comments: Service time is from 4287 to 53    Dr. Fransico Him is Medical Director for Cardiac Rehab at [facility].

## 2020-11-12 DIAGNOSIS — L821 Other seborrheic keratosis: Secondary | ICD-10-CM | POA: Diagnosis not present

## 2020-11-12 DIAGNOSIS — D225 Melanocytic nevi of trunk: Secondary | ICD-10-CM | POA: Diagnosis not present

## 2020-11-12 DIAGNOSIS — D1801 Hemangioma of skin and subcutaneous tissue: Secondary | ICD-10-CM | POA: Diagnosis not present

## 2020-11-12 DIAGNOSIS — L858 Other specified epidermal thickening: Secondary | ICD-10-CM | POA: Diagnosis not present

## 2020-11-12 DIAGNOSIS — L817 Pigmented purpuric dermatosis: Secondary | ICD-10-CM | POA: Diagnosis not present

## 2020-11-12 DIAGNOSIS — Z85828 Personal history of other malignant neoplasm of skin: Secondary | ICD-10-CM | POA: Diagnosis not present

## 2020-11-15 NOTE — Addendum Note (Signed)
Encounter addended by: George Ina, RD on: 11/15/2020 2:48 PM  Actions taken: Flowsheet accepted

## 2020-11-18 NOTE — Addendum Note (Signed)
Encounter addended by: Lance Morin, RN on: 11/18/2020 2:13 PM  Actions taken: Clinical Note Signed, Episode resolved

## 2020-11-18 NOTE — Progress Notes (Signed)
Discharge Progress Report  Patient Details  Name: Stacey [name] MRN: [id number] Date of Birth: 12-24-1940 Referring Provider:   April Manson Pulmonary Rehab Walk Test from 08/23/2020 in [place]  Referring Provider Brand Males, MD       Number of Visits: 15  Reason for Discharge:  Patient reached a stable level of exercise. Patient independent in their exercise. Patient has met program and personal goals.  Smoking History:  Social History   Tobacco Use  Smoking Status Never Smoker  Smokeless Tobacco Never Used    Diagnosis:  ILD (interstitial lung disease) ([place])  ADL UCSD:  Pulmonary Assessment Scores    Row Name 08/23/20 1143 10/24/20 1547       ADL UCSD   ADL Phase -- Exit    SOB Score total 62 54         CAT Score   CAT Score pre 24 14         mMRC Score   mMRC Score 3 3           Initial Exercise Prescription:  Initial Exercise Prescription - 08/23/20 1200      Date of Initial Exercise RX and Referring Provider   Date 08/23/20    Referring Provider Brand Males, MD    Expected Discharge Date 10/24/20      NuStep   Level 1    SPM 75    Minutes 15    METs 1.8      Track   Laps 8    Minutes 15    METs 1.93      Prescription Details   Frequency (times per week) 2    Duration Progress to 30 minutes of continuous aerobic without signs/symptoms of physical distress      Intensity   THRR 40-80% of Max Heartrate 56-112    Ratings of Perceived Exertion 11-13    Perceived Dyspnea 0-4      Progression   Progression Continue progressive overload as per policy without signs/symptoms or physical distress.      Resistance Training   Training Prescription Yes    Weight Orange Bands    Reps 10-15           Discharge Exercise Prescription (Final Exercise Prescription Changes):  Exercise Prescription Changes - 10/15/20 1600      Response to Exercise   Blood Pressure (Admit) 140/70    Blood  Pressure (Exercise) 166/70    Blood Pressure (Exit) 100/50    Heart Rate (Admit) 77 bpm    Heart Rate (Exercise) 105 bpm    Heart Rate (Exit) 87 bpm    Oxygen Saturation (Admit) 99 %    Oxygen Saturation (Exercise) 93 %    Oxygen Saturation (Exit) 94 %    Rating of Perceived Exertion (Exercise) 13    Perceived Dyspnea (Exercise) 2    Duration Continue with 30 min of aerobic exercise without signs/symptoms of physical distress.    Intensity THRR unchanged      Progression   Progression Continue to progress workloads to maintain intensity without signs/symptoms of physical distress.      Resistance Training   Training Prescription Yes    Weight orange bands    Reps 10-15    Time 10 Minutes      NuStep   Level 4    SPM 80    Minutes 15    METs 2.9      Track   Laps 12  Minutes 15    METs 2.39           Functional Capacity:  6 Minute Walk    Row Name 08/23/20 1020 10/24/20 1542       6 Minute Walk   Phase Initial Discharge    Distance 765 feet 1200 feet    Distance % Change -- 56.86 %    Distance Feet Change -- 435 ft    Walk Time 6 minutes 6 minutes    # of Rest Breaks 2  2:00 -2:39 & 4:50-6:00 (total 1:49) 0    MPH 1.4 2.27    METS 1.8 2.51    RPE 12 13    Perceived Dyspnea  4 3    VO2 Peak 6.3 8.8    Symptoms Yes (comment) No    Comments SOB/RPD = 4. No pain. Pt has unsteady gait at times. Pt used rolator for walk test this time due to unsteady gait    Resting HR 71 bpm 79 bpm    Resting BP 120/68 124/60    Resting Oxygen Saturation  95 % 97 %    Exercise Oxygen Saturation  during 6 min walk 92 % 91 %    Max Ex. HR 104 bpm 102 bpm    Max Ex. BP 142/80 140/66    2 Minute Post BP 134/78 128/66         Interval HR   1 Minute HR 85 87    2 Minute HR 93 94    3 Minute HR 92 97    4 Minute HR 99 99    5 Minute HR 104 102    6 Minute HR 91 99    2 Minute Post HR 83 83    Interval Heart Rate? Yes Yes         Interval Oxygen   Interval Oxygen? Yes  Yes    Baseline Oxygen Saturation % 95 % 97 %    1 Minute Oxygen Saturation % 90 % 96 %    1 Minute Liters of Oxygen 0 L 0 L    2 Minute Oxygen Saturation % 89 % 96 %    2 Minute Liters of Oxygen 0 L 0 L    3 Minute Oxygen Saturation % 94 % 94 %    3 Minute Liters of Oxygen 0 L 0 L    4 Minute Oxygen Saturation % 94 % 96 %    4 Minute Liters of Oxygen 0 L 0 L    5 Minute Oxygen Saturation % 87 % 95 %    5 Minute Liters of Oxygen 0 L 0 L    6 Minute Oxygen Saturation % 92 % 91 %    6 Minute Liters of Oxygen 0 L 0 L    2 Minute Post Oxygen Saturation % 98 % 100 %    2 Minute Post Liters of Oxygen 0 L 0 L           Psychological, QOL, Others - Outcomes: PHQ 2/9: Depression screen [facility] 2/9 10/24/2020 08/23/2020 08/23/2020 02/22/2020  Decreased Interest 0 0 0 0  Down, Depressed, Hopeless 0 0 0 0  PHQ - 2 Score 0 0 0 0  Altered sleeping 0 0 - -  Tired, decreased energy 0 1 - -  Change in appetite 0 0 - -  Feeling bad or failure about yourself  0 0 - -  Trouble concentrating 0 0 - -  Moving slowly or  fidgety/restless 0 0 - -  Suicidal thoughts 0 0 - -  PHQ-9 Score 0 1 - -  Difficult doing work/chores Not difficult at all Not difficult at all - -  Some recent data might be hidden    Quality of Life:   Personal Goals: Goals established at orientation with interventions provided to work toward goal.  Personal Goals and Risk Factors at Admission - 08/23/20 0939      Core Components/Risk Factors/Patient Goals on Admission    Weight Management Weight Maintenance    Improve shortness of breath with ADL's Yes    Intervention Provide education, individualized exercise plan and daily activity instruction to help decrease symptoms of SOB with activities of daily living.    Expected Outcomes Short Term: Improve cardiorespiratory fitness to achieve a reduction of symptoms when performing ADLs;Long Term: Be able to perform more ADLs without symptoms or delay the onset of symptoms     Hypertension Yes    Intervention Provide education on lifestyle modifcations including regular physical activity/exercise, weight management, moderate sodium restriction and increased consumption of fresh fruit, vegetables, and low fat dairy, alcohol moderation, and smoking cessation.;Monitor prescription use compliance.    Intervention Provide education and support for participant on nutrition & aerobic/resistive exercise along with prescribed medications to achieve LDL <26m, HDL >460m    Expected Outcomes Short Term: Participant states understanding of desired cholesterol values and is compliant with medications prescribed. Participant is following exercise prescription and nutrition guidelines.;Long Term: Cholesterol controlled with medications as prescribed, with individualized exercise RX and with personalized nutrition plan. Value goals: LDL < 7026mHDL > 40 mg.            Personal Goals Discharge:  Goals and Risk Factor Review    Row Name 09/09/20 1403 10/07/20 1401           Core Components/Risk Factors/Patient Goals Review   Personal Goals Review Develop more efficient breathing techniques such as purse lipped breathing and diaphragmatic breathing and practicing self-pacing with activity.;Increase knowledge of respiratory medications and ability to use respiratory devices properly.;Improve shortness of breath with ADL's Develop more efficient breathing techniques such as purse lipped breathing and diaphragmatic breathing and practicing self-pacing with activity.;Increase knowledge of respiratory medications and ability to use respiratory devices properly.;Improve shortness of breath with ADL's      Review MarMonnie exercising at a high level on the nustep and walking track for an [age]o patient. MarWindi progressing well for an 79 68o.  She seems to enjoy exercising and is @ 2.9 mets on the nustep and walking 11 laps on the track in 15 minutes.  She continues to experience  significant shortness of breath with activity.      Expected Outcomes See admission goals. See admission goals.             Exercise Goals and Review:  Exercise Goals    Row Name 08/23/20 1215             Exercise Goals   Increase Physical Activity Yes       Intervention Provide advice, education, support and counseling about physical activity/exercise needs.;Develop an individualized exercise prescription for aerobic and resistive training based on initial evaluation findings, risk stratification, comorbidities and participant's personal goals.       Expected Outcomes Short Term: Attend rehab on a regular basis to increase amount of physical activity.;Long Term: Add in home exercise to make exercise part of routine and to increase amount of physical  activity.;Long Term: Exercising regularly at least 3-5 days a week.       Increase Strength and Stamina Yes       Intervention Provide advice, education, support and counseling about physical activity/exercise needs.;Develop an individualized exercise prescription for aerobic and resistive training based on initial evaluation findings, risk stratification, comorbidities and participant's personal goals.       Expected Outcomes Short Term: Increase workloads from initial exercise prescription for resistance, speed, and METs.;Short Term: Perform resistance training exercises routinely during rehab and add in resistance training at home;Long Term: Improve cardiorespiratory fitness, muscular endurance and strength as measured by increased METs and functional capacity (6MWT)       Able to understand and use rate of perceived exertion (RPE) scale Yes       Intervention Provide education and explanation on how to use RPE scale       Expected Outcomes Short Term: Able to use RPE daily in rehab to express subjective intensity level;Long Term:  Able to use RPE to guide intensity level when exercising independently       Able to understand and use Dyspnea  scale Yes       Intervention Provide education and explanation on how to use Dyspnea scale       Expected Outcomes Short Term: Able to use Dyspnea scale daily in rehab to express subjective sense of shortness of breath during exertion;Long Term: Able to use Dyspnea scale to guide intensity level when exercising independently       Knowledge and understanding of Target Heart Rate Range (THRR) Yes       Intervention Provide education and explanation of THRR including how the numbers were predicted and where they are located for reference       Expected Outcomes Short Term: Able to state/look up THRR;Long Term: Able to use THRR to govern intensity when exercising independently;Short Term: Able to use daily as guideline for intensity in rehab       Understanding of Exercise Prescription Yes       Intervention Provide education, explanation, and written materials on patient's individual exercise prescription       Expected Outcomes Short Term: Able to explain program exercise prescription;Long Term: Able to explain home exercise prescription to exercise independently              Exercise Goals Re-Evaluation:  Exercise Goals Re-Evaluation    Row Name 09/09/20 1633 10/08/20 1605           Exercise Goal Re-Evaluation   Exercise Goals Review Increase Physical Activity;Increase Strength and Stamina;Able to understand and use rate of perceived exertion (RPE) scale;Able to understand and use Dyspnea scale;Knowledge and understanding of Target Heart Rate Range (THRR);Understanding of Exercise Prescription Increase Physical Activity;Increase Strength and Stamina;Able to understand and use rate of perceived exertion (RPE) scale;Able to understand and use Dyspnea scale;Knowledge and understanding of Target Heart Rate Range (THRR);Understanding of Exercise Prescription      Comments Patient has completed 4 exercise sessions and has already made workload increases. Her first day of exercise she was at 2.3 METS  on the Nustep. She is able to do all exercises with no concerns. She does walk the track with a rolator due to balance issues. She is becoming independent with resistance band exercises. She is exercising at 2.7 METS on the Nustep and 2.39 METS on the track. Will continue to monitor and progress as she is able. Patient has completed 11 exercise sessions and has been steady with  workload and MET level increases. She is independent with resistance training and stretches. She does still use a rolator while walking the track but we feel it is essential for her. She is exercising at 2.9 METS on level 4 on the Nustep and 2.28 METS walking the track. Will continue to monitor and progress as she is able.      Expected Outcomes Through exercise at rehab and home the patient will decrease shortness of breath with daily activities and feel confident in carrying out an exercise regimn at home. Through exercise at rehab and home the patient will decrease shortness of breath with daily activities and feel confident in carrying out an exercise regimn at home.             Nutrition & Weight - Outcomes:  Pre Biometrics - 08/23/20 1125      Pre Biometrics   Height 5' 5.25" (1.657 m)    Weight 61.8 kg    BMI (Calculated) 22.51    Grip Strength 22 kg           Post Biometrics - 10/24/20 1542       Post  Biometrics   Grip Strength 21.5 kg           Nutrition:  Nutrition Therapy & Goals - 09/04/20 0957      Nutrition Therapy   Diet Generally Healthy      Personal Nutrition Goals   Nutrition Goal Pt to build a healthy plate including vegetables, fruits, whole grains, and low-fat dairy products in a heart healthy meal plan.      Intervention Plan   Intervention Prescribe, educate and counsel regarding individualized specific dietary modifications aiming towards targeted core components such as weight, hypertension, lipid management, diabetes, heart failure and other comorbidities.    Expected Outcomes  Short Term Goal: Understand basic principles of dietary content, such as calories, fat, sodium, cholesterol and nutrients.           Nutrition Discharge:   Education Questionnaire Score:  Knowledge Questionnaire Score - 10/24/20 1546      Knowledge Questionnaire Score   Post Score 16/18           Goals reviewed with patient; copy given to patient.

## 2020-11-19 ENCOUNTER — Ambulatory Visit (INDEPENDENT_AMBULATORY_CARE_PROVIDER_SITE_OTHER): Payer: Medicare Other | Admitting: Cardiology

## 2020-11-19 ENCOUNTER — Other Ambulatory Visit: Payer: Self-pay

## 2020-11-19 VITALS — BP 112/60 | HR 75 | Ht 66.0 in | Wt 136.0 lb

## 2020-11-19 DIAGNOSIS — I2510 Atherosclerotic heart disease of native coronary artery without angina pectoris: Secondary | ICD-10-CM

## 2020-11-19 DIAGNOSIS — I10 Essential (primary) hypertension: Secondary | ICD-10-CM

## 2020-11-19 DIAGNOSIS — E7800 Pure hypercholesterolemia, unspecified: Secondary | ICD-10-CM

## 2020-11-19 DIAGNOSIS — J849 Interstitial pulmonary disease, unspecified: Secondary | ICD-10-CM

## 2020-11-19 DIAGNOSIS — Z7189 Other specified counseling: Secondary | ICD-10-CM

## 2020-11-19 NOTE — Progress Notes (Signed)
Cardiology Office Note:    Date:  11/19/2020   ID:  Stacey [name], DOB 11-17-1940, MRN [id number]  PCP:  Shon Baton, MD  Cardiologist:  Buford Dresser, MD PhD  Referring MD: Shon Baton, MD   CC: follow up  History of Present Illness:    Stacey [name] is a 80 y.o. 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  Cardiac history: She was admitted 12/2018 for shortness of breath and found to have elevated troponins. We had an extensive conversation during her admission with shared decision making regarding the elevation of her troponin. She preferred medical management over an invasive strategy. She had a rise and fall of troponins, with peak of 2.38 (old assay), but no chest pain. Echo without wall motion abnormalities. She received >48 hours of heparin and was started on aspirin and statin. Coronary calcium was seen on her CT, and we discussed high intensity statin. However, she was concerned about myalgias and preferred pravastatin.   Today: Still struggling with dizziness and double vision. About the same as before. No clear pattern, never knows when it will happen. Happening several times/week, lasts about 30 minutes. Sometimes the room feels like it is spinning, sometime she sees double. Has discussed with Dr. Virgina Jock. Hasn't limited her lifestyle, but bothersome. Has seen ophthalmology, wears glasses when she drives. Has seen neurology in the past as well.  Following with Dr. Chase Caller for ILD. Has started doing exercises at [facility]. Has pending echo this month for her ILD.  Checks blood pressure occasionally at home. Hasn't noted any very low numbers. Reviewed her medication regimen.  Denies chest pain, shortness of breath at rest or with normal exertion. No PND, orthopnea, LE edema or unexpected weight gain. No syncope or palpitations.  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   Asymptomatic   Low iron    Mitral valve prolapse    Osteoarthritis    Osteoporosis    Pneumonia    Post herpetic neuralgia    Rheumatoid arthritis(714.0)    Yeast infection     Past Surgical History:  Procedure Laterality Date   CHOLECYSTECTOMY  2011   IR ANGIO INTRA EXTRACRAN SEL COM CAROTID INNOMINATE BILAT MOD SED  06/18/2020   IR ANGIO VERTEBRAL SEL SUBCLAVIAN INNOMINATE UNI R MOD SED  06/18/2020   IR ANGIO VERTEBRAL SEL VERTEBRAL UNI L MOD SED  06/18/2020   WISDOM TOOTH EXTRACTION      Current Medications: Current Outpatient Medications on File Prior to Visit  Medication Sig   albuterol (VENTOLIN HFA) 108 (90 Base) MCG/ACT inhaler Inhale 2 puffs into the lungs every 6 (six) hours as needed for wheezing or shortness of breath.   amitriptyline (ELAVIL) 25 MG tablet Take 25 mg by mouth at bedtime.   benzonatate (TESSALON) 200 MG capsule Take 1 capsule (200 mg total) by mouth 3 (three) times daily as needed for cough.   beta carotene w/minerals (OCUVITE) tablet Take 1 tablet by mouth daily.   Biotin 1000 MCG tablet Take 1,000 mcg by mouth daily.   cholecalciferol (VITAMIN D) 1000 UNITS tablet Take 1,000 Units by mouth daily.   Estradiol 10  MCG TABS vaginal tablet Place 10 mcg vaginally daily as needed (dryness).    fluticasone furoate-vilanterol (BREO ELLIPTA) 100-25 MCG/INH AEPB Inhale 1 puff into the lungs daily.   gabapentin (NEURONTIN) 300 MG capsule Take 600 mg by mouth at bedtime.   lactulose (CHRONULAC) 10 GM/15ML solution Take 45 mLs (30 g total) by mouth 2 (two) times daily.   losartan (COZAAR) 50 MG tablet Take 1 tablet (50 mg total) by mouth daily.   methylPREDNISolone (MEDROL) 4 MG tablet Take 1 tablet (4 mg total) by mouth every other day. Alternate with  the 2mg  dose (Patient taking differently: Take 4 mg by mouth daily.)   mometasone-formoterol (DULERA) 100-5 MCG/ACT AERO Inhale 2 puffs into the lungs 2 (two) times daily.   OFEV 100 MG CAPS TAKE 1 CAPSULE BY MOUTH 2 TIMES DAILY.   pantoprazole (PROTONIX) 40 MG tablet Take 1 tablet (40 mg total) by mouth 2 (two) times daily before a meal.   polyethylene glycol (MIRALAX / GLYCOLAX) 17 g packet Take 17 g by mouth 2 (two) times daily. (Patient taking differently: Take 17 g by mouth daily as needed for moderate constipation.)   pravastatin (PRAVACHOL) 20 MG tablet Take 1 tablet (20 mg total) by mouth every evening.   riTUXimab (RITUXAN) 100 MG/10ML injection Inject 100 mg into the vein every 6 (six) months.    senna-docusate (SENOKOT-S) 8.6-50 MG tablet Take 2 tablets by mouth 2 (two) times daily. (Patient taking differently: Take 1 tablet by mouth daily as needed for mild constipation.)   sulfamethoxazole-trimethoprim (BACTRIM DS) 800-160 MG tablet Take 1 tablet by mouth 3 (three) times a week. Take on Mon, Wed, Fri (Patient taking differently: Take 1 tablet by mouth every Monday, Wednesday, and Friday.)   venlafaxine XR (EFFEXOR-XR) 75 MG 24 hr capsule Take 75 mg by mouth at bedtime.    ipratropium-albuterol (DUONEB) 0.5-2.5 (3) MG/3ML SOLN Take 3 mLs by nebulization 3 (three) times daily for 5 days. (Patient taking differently: Take 3 mLs by nebulization every 4 (four) hours as needed (shortness of breath). )   No current facility-administered medications on file prior to visit.     Allergies:   Penicillins, Remicade [infliximab], and Sulfa antibiotics   Social History   Tobacco Use   Smoking status: Never Smoker   Smokeless tobacco: Never Used  Vaping Use   Vaping Use: Never used  Substance Use Topics   Alcohol use: No   Drug use: No    Family History: The patient's family history includes Anemia in her mother; Asthma in her mother; Breast cancer (age of onset: 35) in her maternal  grandmother; COPD in her father; Polymyalgia rheumatica in her mother; Pulmonary fibrosis in her father.  ROS:   Please see the history of present illness.  Additional pertinent ROS otherwise unremarkable.  EKGs/Labs/Other Studies Reviewed:    The following studies were reviewed today: Echo 12/18/18  1. The left ventricle has normal systolic function with an ejection fraction of 60-65%. The cavity size was normal. There is mildly increased left ventricular wall thickness. Left ventricular diastolic Doppler parameters are consistent with impaired  relaxation. No evidence of left ventricular regional wall motion abnormalities.  2. The right ventricle has normal systolic function. The cavity was normal. There is no increase in right ventricular wall thickness. Right ventricular systolic pressure normal with an estimated pressure of 27.8 mmHg.  3. The mitral valve is grossly normal. There is mild mitral annular calcification present.  4. The tricuspid valve is grossly  normal. There is mild tricuspid regurgitation.  5. The aortic valve is tricuspid with moderate calcification and thickening of the noncoronary cusp.  6. The aortic root is normal in size and structure.    EKG:  EKG is personally reviewed.  The ekg ordered 02/09/20 demonstrates NSR  Recent Labs: 02/12/2020: Magnesium 2.2 06/21/2020: BUN 10; Creatinine, Ser 0.68; Hemoglobin 13.1; Platelets 250; Potassium 4.2; Sodium 135 10/22/2020: ALT 14  Recent Lipid Panel    Component Value Date/Time   CHOL 222 (H) 12/19/2018 1532   TRIG 79 06/11/2019 2338   HDL 59 12/19/2018 1532   CHOLHDL 3.8 12/19/2018 1532   VLDL 26 12/19/2018 1532   LDLCALC 137 (H) 12/19/2018 1532    Physical Exam:    VS:  BP 112/60 (BP Location: Left Arm, Patient Position: Sitting, Cuff Size: Normal)   Pulse 75   Ht 5\' 6"  (1.676 m)   Wt 136 lb (61.7 kg)   BMI 21.95 kg/m     Wt Readings from Last 3 Encounters:  11/19/20 136 lb (61.7 kg)  10/22/20 136 lb 3.2 oz  (61.8 kg)  10/15/20 136 lb 11 oz (62 kg)   GEN: Well nourished, well developed in no acute distress HEENT: Normal, moist mucous membranes NECK: No JVD CARDIAC: regular rhythm, normal S1 and S2, no rubs or gallops. No murmur. VASCULAR: Radial and DP pulses 2+ bilaterally. No carotid bruits RESPIRATORY:  Clear to auscultation without rales, wheezing or rhonchi  ABDOMEN: Soft, non-tender, non-distended MUSCULOSKELETAL:  Ambulates independently SKIN: Warm and dry, no edema NEUROLOGIC:  Alert and oriented x 3. No focal neuro deficits noted. PSYCHIATRIC:  Normal affect    ASSESSMENT:    1. Coronary artery calcification seen on CAT scan   2. 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[place])   3. Pure hypercholesterolemia   4. Essential hypertension   5. Cardiac risk counseling   6. Counseling on health promotion and disease prevention    PLAN:    Interstitial lung disease, followed by Dr. Chase Caller  Intermittent dizziness -try to catch vitals during an episode.  Coronary calcium on CT: suggests she may have underlying CAD, recommend aggressive prevention  Hyperlipidemia:  -see prior discussions re: high intensity statins -continue pravastatin 20 mg nightly per patient preference   Hypertension: well controlled today -continue losartan  Cardiac risk counseling and prevention recommendations: -recommend heart healthy/Mediterranean diet, with whole grains, fruits, vegetable, fish, lean meats, nuts, and olive oil. Limit salt. -recommend moderate walking, 3-5 times/week for 30-50 minutes each session. Aim for at least 150 minutes.week. Goal should be pace of 3 miles/hours, or walking 1.5 miles in 30 minutes -recommend avoidance of tobacco products. Avoid excess alcohol. -ASCVD risk score: The ASCVD Risk score Mikey Bussing DC Jr., et al., 2013) failed to calculate for the following reasons:   The 2013 ASCVD risk score is only valid for ages 45 to 67    Plan for follow up: 6 mos or sooner PRN  Medication  Adjustments/Labs and Tests Ordered: Current medicines are reviewed at length with the patient today.  Concerns regarding medicines are outlined above.  No orders of the defined types were placed in this encounter.  No orders of the defined types were placed in this encounter.   Patient Instructions  Medication Instructions:  Your Physician recommend you continue on your current medication as directed.    *If you need a refill on your cardiac medications before your next appointment, please call your pharmacy*   Lab Work: None   Testing/Procedures: None  Follow-Up: At [facility], you and your health needs are our priority.  As part of our continuing mission to provide you with exceptional heart care, we have created designated Provider Care Teams.  These Care Teams include your primary Cardiologist (physician) and Advanced Practice Providers (APPs -  Physician Assistants and Nurse Practitioners) who all work together to provide you with the care you need, when you need it.  We recommend signing up for the patient portal called "MyChart".  Sign up information is provided on this After Visit Summary.  MyChart is used to connect with patients for Virtual Visits (Telemedicine).  Patients are able to view lab/test results, encounter notes, upcoming appointments, etc.  Non-urgent messages can be sent to your provider as well.   To learn more about what you can do with MyChart, go to [email or web address].    Your next appointment:   6 month(s) @ [address] [place], [place] [address]   The format for your next appointment:   In Person  Provider:   Buford Dresser, MD    Signed, Buford Dresser, MD PhD 11/19/2020   [place]

## 2020-11-19 NOTE — Patient Instructions (Signed)
Medication Instructions:  Your Physician recommend you continue on your current medication as directed.    *If you need a refill on your cardiac medications before your next appointment, please call your pharmacy*   Lab Work: None   Testing/Procedures: None   Follow-Up: At [facility], you and your health needs are our priority.  As part of our continuing mission to provide you with exceptional heart care, we have created designated Provider Care Teams.  These Care Teams include your primary Cardiologist (physician) and Advanced Practice Providers (APPs -  Physician Assistants and Nurse Practitioners) who all work together to provide you with the care you need, when you need it.  We recommend signing up for the patient portal called "MyChart".  Sign up information is provided on this After Visit Summary.  MyChart is used to connect with patients for Virtual Visits (Telemedicine).  Patients are able to view lab/test results, encounter notes, upcoming appointments, etc.  Non-urgent messages can be sent to your provider as well.   To learn more about what you can do with MyChart, go to [email or web address].    Your next appointment:   6 month(s) @ [address] [place], [place] [address]   The format for your next appointment:   In Person  Provider:   Bridgette Christopher, MD    

## 2020-11-26 ENCOUNTER — Other Ambulatory Visit (HOSPITAL_COMMUNITY): Payer: Medicare Other

## 2020-12-10 ENCOUNTER — Ambulatory Visit (HOSPITAL_COMMUNITY): Payer: Medicare Other | Attending: Internal Medicine

## 2020-12-10 ENCOUNTER — Other Ambulatory Visit: Payer: Self-pay

## 2020-12-10 DIAGNOSIS — Z79899 Other long term (current) drug therapy: Secondary | ICD-10-CM | POA: Diagnosis not present

## 2020-12-10 DIAGNOSIS — J4540 Moderate persistent asthma, uncomplicated: Secondary | ICD-10-CM | POA: Diagnosis not present

## 2020-12-10 DIAGNOSIS — J849 Interstitial pulmonary disease, unspecified: Secondary | ICD-10-CM | POA: Diagnosis not present

## 2020-12-10 LAB — ECHOCARDIOGRAM COMPLETE
Area-P 1/2: 3.21 cm2
S' Lateral: 2.6 cm

## 2020-12-18 NOTE — Progress Notes (Signed)
Seeing her 12/23/20. Gr1 Ddx . Posislbe elevation in PASP. Might need right heart cath. Will discuss 12/23/20

## 2020-12-20 ENCOUNTER — Other Ambulatory Visit (HOSPITAL_COMMUNITY)
Admission: RE | Admit: 2020-12-20 | Discharge: 2020-12-20 | Disposition: A | Discharge status: Home / Self Care | Payer: Medicare Other | Source: Ambulatory Visit | Attending: Internal Medicine | Admitting: Internal Medicine

## 2020-12-20 ENCOUNTER — Other Ambulatory Visit: Payer: Self-pay

## 2020-12-20 DIAGNOSIS — Z01812 Encounter for preprocedural laboratory examination: Secondary | ICD-10-CM | POA: Diagnosis not present

## 2020-12-20 DIAGNOSIS — Z20822 Contact with and (suspected) exposure to covid-19: Secondary | ICD-10-CM | POA: Insufficient documentation

## 2020-12-20 LAB — SARS CORONAVIRUS 2 (TAT 6-24 HRS): SARS Coronavirus 2: NEGATIVE

## 2020-12-23 ENCOUNTER — Other Ambulatory Visit: Payer: Self-pay

## 2020-12-23 ENCOUNTER — Encounter: Payer: Self-pay | Admitting: Internal Medicine

## 2020-12-23 ENCOUNTER — Ambulatory Visit (INDEPENDENT_AMBULATORY_CARE_PROVIDER_SITE_OTHER): Payer: Medicare Other | Admitting: Internal Medicine

## 2020-12-23 VITALS — BP 118/74 | HR 77 | Ht 65.0 in | Wt 136.2 lb

## 2020-12-23 DIAGNOSIS — Z79899 Other long term (current) drug therapy: Secondary | ICD-10-CM

## 2020-12-23 DIAGNOSIS — J4540 Moderate persistent asthma, uncomplicated: Secondary | ICD-10-CM

## 2020-12-23 DIAGNOSIS — J849 Interstitial pulmonary disease, unspecified: Secondary | ICD-10-CM | POA: Diagnosis not present

## 2020-12-23 LAB — PULMONARY FUNCTION TEST
DL/VA % pred: 86 %
DL/VA: 3.51 ml/min/mmHg/L
DLCO cor % pred: 77 %
DLCO cor: 14.98 ml/min/mmHg
DLCO unc % pred: 77 %
DLCO unc: 14.98 ml/min/mmHg
FEF 25-75 Pre: 0.76 L/sec
FEF2575-%Pred-Pre: 52 %
FEV1-%Pred-Pre: 57 %
FEV1-Pre: 1.17 L
FEV1FVC-%Pred-Pre: 95 %
FEV6-%Pred-Pre: 64 %
FEV6-Pre: 1.66 L
FEV6FVC-%Pred-Pre: 105 %
FVC-%Pred-Pre: 60 %
FVC-Pre: 1.66 L
Pre FEV1/FVC ratio: 71 %
Pre FEV6/FVC Ratio: 100 %

## 2020-12-23 NOTE — Addendum Note (Signed)
Addended by: Lorretta Harp on: 12/23/2020 03:31 PM   Modules accepted: Orders

## 2020-12-23 NOTE — Patient Instructions (Addendum)
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   Plan - Increase nintedanib 150 mg twice daily with food  - take sample to give feedback -Continue Rituxan and steroids through the rheumatologist  - if ofev is intolerable or disease progresse will discuss with Stacey [name] immuran or cellcept - continuie  breo 100 dosing daily once with albuterol as needed  - cotninue singulaire - hold off starting o2 per your wishes  - hold off discussion on right heart cath till we see how increased ofev tolerance goes   Follow-up -4 weeks telephone visit with APP to discuss ofev tolerance - 3 months do spirometry and DLCO and then 30 min visit with Stacey [name] -  -Symptom score and simple walk test at follow-up

## 2020-12-23 NOTE — Progress Notes (Signed)
Brief patient profile:  56 yowf  never smoker with allergies/inhalers as child outgrew by [employer] then  RA since around 2000  Prednisone  x decades and prev eval by Dr Joya Gaskins around 2004 for sob resolved s maint rx and referred 05/26/2013 by Dr Shelia Media for bronchitis and abn cxr   History of Present Illness  05/26/2013 1st [facility] office visit/ Wert cc June 2014 dx pna  In [place] and remicade stopped and 100% better and placed arencia in September 2014  then abruptly worse first week in November with cough green sputum s nasal symptoms, fever low grade and no cp or cough and completely recovered prior to [place] does not recall abx but issue is why keeps getting sick and abn CT Chest (see below).   Arthritis symptoms well controlled at present on Rx for RA rec Nexium 40 mg Take 30-60 min before first meal of the day and add pepcid 20 mg one at bedtime whenever coughing.     10/19/2017  f/u ov/Wert re:  RA lung dz  Chief Complaint  Patient presents with   Follow-up    Cough is much improved, but has not resolved yet. Cough is non prod. She has not had to use her neb.   Dyspnea:  Not limited by breathing from desired activities  But some doe x steps Cough: daytime > noct dry  Sleep: fine  SABA use:  No saba Medrol 4 mg a/w 52m per day/ ok control of arthritis  rec Start back on gabapentin up to 300 mg each am  in addition to the the two at bedtime  If not better increase the medrol to 8 mg daily until bettter then taper back to where you      01/10/2018  f/u ov/Wert re:  RA  Lung dz Medrol  4 mg  One alternating with a half Chief Complaint  Patient presents with   Follow-up    PFT's done. Her breathing has been gradually worsening since the last visit. She has occ cough- non prod.   Dyspnea gradually worse since last ov:  MMRC1 =  MMRC3 = can't walk 100 yards even at a slow pace at a flat grade s stopping due to sob    Gradually x 3 m / more fatigue / no change in  arthritis  Cough: not an issue rec Protonix 40 mg Take 30-60 min before first meal of the day  GERD diet   01/17/2018 acute extended ov/Wert re: cough on medrol 4 mg  One a/w one half  Chief Complaint  Patient presents with   Acute Visit    started coughing 01/11/18- occ prod with minimal green sputum.  She states also wheezing and having increased SOB.    abruptly worse 01/11/18 with severe 24/7 coughing >>  prod min green mucus esp in am/ assoc with subjective wheeze and did not follow previous contingencies re flutter / saba/ increase medrol and admits she does not rember those written instructions nor how to use the neb provided .  No fever/ comfortable at rest sitting  rec For cough > mucinex dm 1200 mg every 12 hours and cough into the flutter valve as much as possible  Doxycycline 100 mg twice daily x 10 days with glass of water Medrol 425mx 2 now and take 2  daily until cough is better then 1 daily x 5 days and then resume the previous dose  Shortness of breath/ wheezing/ still  coughing > albuterol neb every 4 hours as needed      01/20/2018 acute extended ov/Wert re: refractory cough and sob 01/11/18 Chief Complaint  Patient presents with   Acute Visit    she is not feeling better, coughing , very SOB, wheezing  mucus now clear/scant  on doxy/ neb machine not working (tube would not plug into the side s adequate force and she was not capable of applying it due to RA hands. Cough/ wheeze/ sob 24/7 / flutter not helping/ can't lie down at hs   rec While coughing protonix 40 mg Take 30- 60 min before your first and last meals of the day  Shortness of breath/ wheezing/ still coughing > albuterol neb every 4 hours as needed  Depomedrol 120 mg IM and medrol 32 mg daily x 2 days,  then 16 mg x 3 days,  Then 8 mg x 4 days , then resume the 4 mg daily  For severe cough > tylenol 3# one every 4 hours if needed  Go to ER if condition worsens on above plan       Date of admission: 01/22/2018              Date of discharge: 01/27/2018   History of present illness: As per the H and P dictated on admission, " Myleah Cavendish  is a 80 y.o. female, w Rheumatoid arthritis, ILD Asthma, apparently c/o increase in dyspnea this evening. Dry cough.   Denies fever, chills, cp, palp,  N/v, diarrhea, brbpr, black stool.   Pt notes recently being given steroid injection in office as well as being placed on doxycycline. This might have helped slightly but pt worse  Hospital Course:  Summary of her active problems in the hospital is as following. 1 dyspnea/hypoxemia/ILD Concerned that likely GERD Is causing ILD. Patient with cough.   Patient with complaints of awakening with cough and also with oral intake which is slightly improved since 01/24/2018. assessed by speech therapy and speech therapy raising concern of esophageal component but no signs of aspiration.   2D echo with a EF of 55 to 60% with no wall motion abnormalities, grade 1 diastolic dysfunction.  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 Pulmonary felt that the patient should be on scheduled Reglan. I have placed the patient on scheduled potassium before sleep. Continue steroids on discharge continue Mucinex and Claritin as well as inhalers. Patient will follow-up with pulmonary outpatient   2.  Gastroesophageal reflux disease Continue PPI and H2 blocker.  I changed PPI to [facility].   3.  Rheumatoid arthritis Outpatient follow-up.     4.  Anxiety Continue Effexor.       All other chronic medical condition were stable during the hospitalization.  Patient was ambulatory without any assistance. On the day of the discharge the patient's vitals were stable , and no other acute medical condition were reported by patient. the patient was felt safe to be discharge at home with family.   Consultants: PCCM  Procedures: Echocardiogram       03/21/2018  f/u ov/Wert re:   S/p admit was transiently  better  and downhill since Labor day on medrol 4 mg daily  Last orencia on Sept 4th 2019  Chief Complaint  Patient presents with   Acute Visit    Per patient, she has had a dry cough since July 2019. She has been wheezing as well. Increased fatigue. Body aches. Denies any fever or chills.  Dyspnea:  MMRC4  = sob if tries to leave home or while getting dressed   Cough: harsh/ hacking mostly dry/ has flutter not using    SABA use: not much better with rx   No obvious day to day or daytime variability or assoc excess/ purulent sputum or mucus plugs or hemoptysis or cp or chest tightness, subjective wheeze or overt sinus or hb symptoms.     Also denies any obvious fluctuation of symptoms with weather or environmental changes or other aggravating or alleviating factors except as outlined above   No unusual exposure hx or h/o childhood pna/ asthma or knowledge of premature birth.   INpatient consult 03/26/18 80 year old with rheumatoid arthoritis.At baseline the patient lives at home with her husband and is independent of ADLs.  Has been on many immune suppressants over decades and curently on orencia x 4 year and prednisone. Does not recollect being on bactrim/dapsone. Chart mentions BOOP/MAI in 2001 but she denies this. 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 Got admitted. Rx with steroids. Per Notes - clnical suspicion of  arytenoid inflmmation related wheeze noticed (she also reports asthma NOS). Follolwup with ENT recommended (but not seen one as yet). She also appears to have passed swallow with rec for regular diet with thin liquids but did to have mild eso stricture and reflux during testing . PFT shows 10% FVC decline for first ime. CT chest at this tme (aug 2019) showed new rLL infiltrate.  ECHO July 2019 without evicence of elevated  PASP and saw cards [facility] June 2019 and was considered to have worsenin dyspnea due to [facility] issues (reports stress test at [facility] that was normal but I cannot see it)   She reports after discharge she got better but in last several weeks has deteriorated with cough and dyspnea. There is new hypoxemia (currently RA with nail polish and poor circulation  - 89% pulse ox) needing 2L [place]. Per [facility] improved with steroids and abx. CTA 03/24/2018 => shows that RLL inifltrate has improved . Other chronic ILD changes + and small  Hiatal hernia + witthout change.   Review of lab work does show eosinophilia at time of admision   EVENTS 03/21/18 - IgE -5, blood allegy panel - negative, 03/23/2018 - - admit . HIGH EOS 2300, ESR 48, BNP 89 , HIV neg 9/12- PCT negative, RVP negative 9/14 -  PCT < 0.1, Urine strep - negative, MRSA PCR - positive. IgE - normal 4, Blood allergy panel repeat - negative 03/26/2018 - leading consideration for airway (BO in RA +/- asthma) related flare either due to MRSA bronchitis or clinical suspicion of arytenoid inflmmation +/- GERD relatd flare (she has small hiatal hernia)  +/- ? Dysphagia  up causing mild hypoxemia acute resp failure, wheeze . Allergy and IgE blood work negative thought. Patient reports being better but says she is choking on drinkin water [facility] MD says wheeze improved significantly with steroids.  RN says was down to RA yesterday evening but needed 1L [place] at sleep. Today -Room air at rest 94% and desaturated to 86% walking 60 feet 03/27/2018  - better. Off o2 at rest. STill coughs with water and when lies down.  Husband at bedside. Both requesting ILD clinic followup . Desaturated t 79% walking 90 feet.   OV  04/12/2018  Subjective:  Patient ID: Stacey [name], female , DOB: 03-25-1941 , age 34 y.o. , MRN: [id number] , ADDRESS: [place] [id number]   04/12/2018 -   Chief Complaint  Patient presents with   Consult    Pt is a former [facility] pt.  Pt  denies any current complaints of cough, SOB, or CP but states the cough she originally had ended her up in the hosp 9/11-9/17 with dx acute respiratory failure. Pt does wear 2pulse with exertion and also wears 2L continuous when at home.     HPI KENDRA GRISSETT 80 y.o. -presents for follow-up to the ILD clinic.  She is known to have rheumatoid arthritis with ILD changes.  She had been followed by Dr. Christinia Gully.  However in July 2019 in September 2019 she has had 2 admissions to the hospital with respiratory distress and hypoxemic respiratory failure.  In the first 1 that seem to be right lower lobe infiltrate and then she improved from it but in the second 1 even though the right lower lobe infiltrates were better she still was hypoxemic.  Acid reflux and dysphagia was considered a possible etiology but she passed swallow study 2 times.  They thought she had some reflux.  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  At the same time it is not clear if the ILD is progressive based on pulmonary function testing below   At this point in time she tells me that she is getting home physical therapy.  Her fatigue is improving but it is not fully resolved.  She was discharged on continuous oxygen which she is using.  However she is feeling less short of breath.  Today in fact when we turned her oxygen off and walked her she did not desaturate and this is a significant improvement.  She is on monthly Orencia through the [facility] rheumatologist Dr. Eda Paschal.  At this point in time she is put the Orencia on hold.  She told me that she is been on Orencia for 4 years and never had a respiratory exacerbation still recently x 2.  Although before going on Orencia she had pneumonia while on Remicade and the Remicade.  In terms of her rheumatoid arthritis she hardly has any pain.  Her joint architecture is fairly well-preserved because of various immunomodulators over  time.  She says that she was on Remicade for years and when she stopped it for 8 weeks before the switch to [place] she never really had a relapse in her rheumatoid arthritis.  She is largely pain and stiffness free.  She believes she can go without  her Orencia for a while.  Review of the literature shows greater than 10% chance of a respiratory infection especially COPD exacerbation.  Although the time frame for this is unclear.       OV 06/07/2018  Subjective:  Patient ID: Stacey [name], female , DOB: 17-Mar-1941 , age 17 y.o. , MRN: [id number] , ADDRESS: [place] [address]   06/07/2018 -   Chief Complaint  Patient presents with   Follow-up    ILD, PFT done today, some wheezing and coughing but better tha before   Rheumatoid arthritis ILD and asthma/obstructive lung disease phenotype on Dulera  HPI VENISSA NAPPI 80 y.o. -presents for routine follow-up with her husband.  She is here to follow-up with [facility] Dr. Stann Mainland.  She plans to do  this in December 2019.  She continues to be off Orencia.  Her joints are slowly getting stiff again.  She believes that she will need to be back on immunosuppression agent again.  She currently continues Medrol 4 mg alternating with 2 mg.  This for her rheumatoid arthritis.  In terms of her joints she continues on Medrol 4 mg alternating with 2 mg but not on any other immunosuppression agent.  Overall she is been stable but for the last 2 weeks has had green sputum and wheezing and chest congestion and cough.  She recently visited her husband who was hospitalized and walking the long hallways at [facility] made a short of breath but she thinks this is probably baseline for her.  There are no other new issues.  She did have spirometry and DLCO and this shows a decline compared to September 2019 and a similar to July 2019.  It is documented below.  This is probably reflective of a flareup   OV  07/21/2018  Subjective:  Patient ID: Stacey [name], female , DOB: [birth year] , age 89 y.o. , MRN: [id number] , ADDRESS: [place] [address]   07/21/2018 -   Chief Complaint  Patient presents with   Follow-up    Pt states she has been doing well since last visit. States she is about to begin Rituxan with [facility]. Pt still becomes SOB with exertion. Denies any complaints of cough or CP.   Rheumatoid arthritis ILD and asthma/obstructive lung disease phenotype on Dulera  HPI SUMER MOOREHOUSE 80 y.o. -presents for follow-up of the above.  Last seen just before Thanksgiving 2019.  In the interim overall stable although on June 23, 2018 she climbs a steep flight of stairs which is unusual exertion for her and she became very dyspneic.  Following day saw Dr. Stann Mainland at [facility] rheumatology and was given Z-Pak and prednisone and started feeling better.  Although it is not fully clear to me she had fever and bronchitic symptoms.  I reviewed Dr. Stann Mainland note.  Dr. Stann Mainland is decided to start Rituxan for rheumatoid arthritis.  She is only having some minimal joint pain at this point.  She is off Orencia and continues to be off [place].  She did have some blood work with [place] before starting Rituxan.  She is due to see Dr. Stann Mainland within the next week and start her Rituxan.  Her liver function test July 18, 2017 is normal hemoglobin is normal.  CRP is also normal.  We did spirometry and walking desaturation test.  These show improvement compared to before and these are documented below.  Currently she not using nighttime or daytime oxygen.  She is wondering if she could switch rheumatology care to [facility].  This is because while she likes [employer] she is getting older and more frail and feels some body local would be of help.  I have sent a message to Dr. Estanislado Pandy inquiring.  Certainly we can help her with Rituxan infusions at [place] system if needed.  She  will check on this with her [facility] rheumatologist.       OV 03/27/2019  Subjective:  Patient ID: Stacey [name], female , DOB: [birth year] , age 24 y.o. , MRN: [id number] , ADDRESS: [place] [address]  Rheumatoid arthritis ILD and asthma/obstructive lung disease phenotype on [facility]   03/27/2019 -  Rourine fu   HPI JAMISON YUHASZ 80 y.o. -  presents for the above.  Last seen in January 2020.  After that she has seen Dr. Stann Mainland rheumatology at [facility].  She is now getting Rituxan 2 doses every 6 months.  She says this is helped her joints and her stiffness.  She is a little bit more mobile than usual.  However in terms of her respiratory status she continues to have episodic cough.  In June 2020 she again got hypoxemic and got admitted.  Since then she has had episodic cough.  She had a respiratory exacerbation in June 2020 for the admission she got steroids.  This seemed to help.  She is also on a higher dose Dulera right now.  In terms of her cough this seems to be her biggest problem.  She seems to be on Dulera, Singulair scheduled with also Tessalon and Delsym and DuoNeb.  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  Her last high-resolution CT scan of the chest was 1 year ago.  She says the dyspnea itself is not worse.  She is currently not using oxygen.  On exam she did have some wheezing.  Currently she has white and brown sputum but this is baseline.  In terms of a COVID wrist she has been tested recently couple of times and this is been negative.  She is isolating well.  She wanted to know COVID prevention activities and risk status and masking strategies.      OV 05/24/2019  Subjective:  Patient ID: Stacey [name], female , DOB: [birth year] , age 68 y.o. , MRN: [id number] , ADDRESS: [place] [address]   05/24/2019 -   Chief  Complaint  Patient presents with   Follow-up    Pt was recently in the hosp due to ILD. Pt states that she has been better since being out of the hosp.   Rheumatoid arthritis ILD and asthma/obstructive lung disease phenotype on Dulera/Singulair  Post herpetic neuralgia - on elavil, gabapentin, effexor  HPI BERNARDINE LANGWORTHY 80 y.o. -returns for follow-up.  At the last visit approximately 2 months ago she was reporting worsening cough following an admission in summer 2020.  Her pulmonary function test suggested worsening ILD status.  Therefore we requested a high-resolution CT chest which she did in October 2020.  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  However in the interim after the CT scan was done towards the end of October 2020 she developed worsening of her cough over 2 weeks and also associated shortness of breath but significantly the cough is much worse.  She ended up getting admitted to the hospital.  There was some hypoxemia.  By this time she had finished a ENT evaluation that did not show any involvement of the arytenoids.  Pulmonary was consulted.  She was given a prednisone burst which she just finished I believe yesterday.  She is back on her baseline Medrol.  And she is feeling better.  Her oxygen status is improved although she is using oxygen at night now.  She is really frustrated with these recurrent flareups and these admissions which ended up with her having a wheeze and also hypoxemia.  Currently she is on her baseline Medrol for rheumatoid arthritis associated with Dulera and Singulair.  She is also on losartan for blood pressure.  Her walking desaturation test is slightly worse  than baseline.  She has a GI consult pending because of the recurrent episodes of cough and flareups.  We went over exposure history.  We used interstitial lung disease questionnaire for the exposure history.  Specifically she denies any electronic cigarette use of  marijuana use of cocaine use or any IV drug abuse.  [place] last 14 years.  Asked extensive questions about the home environment it is positive for nebulizer use but the nebulizer does not have mildew or mold in it.  Otherwise no organic antigen exposure.  The house is not damp.  There is no mold or mildew in the shower curtain.  There is no humidifier use no steam iron use.  No Jacuzzi use.  No misting [place] outside to inside the house.  No pet birds.  No pet gerbils no feather pillows.  There is no mold in the [facility] duct.  She does not do any gardening.  Does not use wind instruments.  Also 122 question occupational history elicited and essentially negative.  The other issue is that she has polypharmacy.  She is asking for my help in reducing her medications.  She is on 3 medications for postherpetic neuralgia.  She is on losartan      OV 06/21/2019  Subjective:  Patient ID: Stacey [name], female , DOB: 1940-07-27 , age 83 y.o. , MRN: [id number] , ADDRESS: [place] [address]   06/21/2019 -   Chief Complaint  Patient presents with   televisit    hosp 11/29-12/4 due to covid with pna. pt said that she is doing okay after recent hosp but states she has no energy.      AUGUSTA MIRKIN 80 y.o. - last visit 05/24/2019. Diagnosed with covid-19 on 05/31/2019. Admitted 06/11/2019  - 06/16/2019. Quarantine ends 06/21/2019 today. Rx with Remdesviri and Decadron. Husband also on phone. Questions  1. Quarantine ends - from 06/22/19_0 and she is not contagious and likely resistant to reinfection to covid for another few months  2. She is on 10 day dexamethasone for covid - today is last day for it  3. She is on dulera. Hospital gave combivent and she does not want do combivent - this is fine  4. Ofev for ILD - not started it yet . She had questions about side effects. Explained GI and LFT monirtong. SHe wanted to  wait till Jan 2021 and start it . I am ok with that.   5. Small hiat   IMPRESSION: HRCT OCt 2020 1. 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 Findings have progressed since 2017 and 2019 high-resolution chest CT studies. 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 Findings are consistent with UIP per consensus guidelines: Diagnosis of Idiopathic Pulmonary Fibrosis: An Official ATS/ERS/JRS/ALAT Clinical Practice Guideline. [place], Iss 5, [phone], Mar 13 2017. 2. 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 4. Small hiatal hernia.   Aortic Atherosclerosis (ICD10-I70.0).     Electronically Signed   By: Ilona Sorrel M.D.   On: 04/24/2019 13:29    OV 08/03/2019 - face to face visit  Subjective:  Patient ID: Stacey [name], female , DOB: [birth year] , age 54 y.o. , MRN: [id number] , ADDRESS: [place] [address]    Rheumatoid arthritis with Joint  s/ILD- Rituxan/steroids ([facility])  - progressive phenotype (CT 2017/2019 -> Oct 2020). Planned Ofev start in Nov 2020 but did not start as of dec 2020. LFT normal Jan 2021  [place]/obstructive lung disease phenotype on Dulera/Singulair  Post herpetic neuralgia - on elavil, gabapentin, effexor  Coronary Artery Calcification - 1 vessel - cardiology referral done  Diagnosed with covid-19 on 05/31/2019. Admitted 06/11/2019  - 06/16/2019. Rx with Remdesviri and Decadron.  Small Hiatal hernia onCT Oct 2020  Rib fracture from fall Dec 2020: Musculoskeletal: There is nondisplaced fractures of the posterior left fifth, 6, 8, 9, and tenth left ribs. There is unchanged slight superior compression deformities of the T5, T6, T10 vertebral bodies with less than 25% loss in height.Associated pulmnary contusion  +    HPI JENESA FORESTA 80 y.o. -presents for a visit. Her nintedanib has been delayed because of COVID-19. Also subsequently a fall. She saw a nurse practitioner on July 20, 2019 and they took a shared decision to start nintedanib. She had a ? telephone visit with Dr. Candie Mile the rheumatologist at [facility]. Patient scheduled for her next Rituxan in February 2021 but reviewed the notes indicates this could be pushed to early spring 2021. Dr. Stann Mainland is okay with the patient study got intubated at this point in time.  Patient has follow-up with Dr. Stann Mainland tomorrow at [facility].  At this face-to-face visit she is here with her husband.  She says she is much better after the Covid and also the fall that fractured her ribs.  Nevertheless all this is left her fatigue.  Infective fatigue scores are much worse.  Also in the last 2 weeks has had increased cough wheezing and shortness of breath.  The sputum was also changed color in the last 1 week to clear green.  This is all new.  Her symptom score is therefore a worse than her baseline.  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  She is only taking 100 mg once a day.  The plan was to go up to 100 mg twice a day next week.  This is a minimum effective dose.  The maximum dose is 150 mg twice a day.  Given her age and comorbidities we are starting at a low dose.  Part of this visit is to make sure that she is tolerating the drug fine.  And so far she is.  She is interested in the ILD-pro registry study done by the [facility].   She is working with physical therapy for her fatigue.  Ambulatory Walk 07/20/2019 2 Lap- O2 92% RA; HR 109 No shortness of breath She did not need to stop Used walker    OV 09/20/2019  Subjective:  Patient ID: Stacey [name], female , DOB: Jun 20, 1941 , age 74 y.o. , MRN: [id number] , ADDRESS: [place] [address]   09/20/2019 -   Chief Complaint  Patient presents with   Televisit    Called and spoke with pt who stated she has been feeling okay since last visit. Pt stated she is still taking OFEV and denies any complaints. Pt states she believes her breathing is stable at this point.    Rheumatoid arthritis with Joint s/ILD- Rituxan/steroids ([facility])  - progressive phenotype (CT 2017/2019 -> Oct 2020). Planned Ofev start in Nov 2020 but did not start as of dec 2020.  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 Admitted 06/11/2019  - 06/16/2019. Rx with Remdesviri and Decadron.  Small Hiatal hernia on CT Oct 2020  Rib fracture from fall Dec 2020: Musculoskeletal: There is nondisplaced fractures of the posterior left fifth, 6, 8, 9, and tenth left ribs. There is unchanged slight superior compression deformities of the T5, T6, T10 vertebral bodies with less than 25% loss in height.Associated pulmnary contusion +   HPI MaNAMITA [id number].o. - has 2nd covid vaccine later this week. Rituxan is end of the month. Baseline RA regiment is On dulera for associated obstructio of lung. Continue ofev 10023mid since mid jan 2021 for ILD. No side effects. Did see BetDerl Barrowr face to face visit early feb 2021 -> for bronchitis and felt better after prdnisone and zpak. Currently on medrol   8 mg day and staying there per BetDerl BarrowWants t oknow if she can taper. Wants to know how she can prevent care flare ups. Explained ofev, masking and social distancing  prevent respiratory infection.Denies choking on food or aspirating. Denies mold in house - relatively news. Denies dog. Denies cat. Denies carious teeth. Denies post nasal drip  OVerll feels better and improved dyspnea.     OV 12/18/2019  Subjective:  Patient ID: MarTamala Fothergillemale , DOB: 6/215-Apr-1942age 31 57o. , MRN: [id number]: [id number] RusShon BatonD Rheumatologist-Dr. JenEda Paschal [facility]: Dr. RamVeryl Speak/01/2020 -   Chief Complaint  Patient presents with   Follow-up    no worse   Rheumatoid arthritis with Joint s/ILD- Rituxan/steroids ([facility] uinviesty)  - progressive phenotype (CT 2017/2019 -> Oct 2020). Planned Ofev start in Nov 2020 but did not start as of dec 2020. 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 Admitted 06/11/2019  - 06/16/2019. Rx with Remdesviri and Decadron.  Small Hiatal hernia on CT Oct 2020  Rib fracture from fall Dec 2020: Musculoskeletal: There is nondisplaced fractures of the posterior left fifth, 6, 8, 9, and tenth left ribs. There is unchanged slight superior compression deformities of the T5, T6, T10 vertebral bodies with less than 25% loss in height.Associated pulmnary contusion +  -On Reclast once a year through Dr. JennifEda Paschal MargarTamala Fothergillo5-returns for follow-up of her ILD.  It has been a few month since I last saw her.  In this time she did not taper her Medrol down.   She stated 8 mg/day.  She was originally taking the Medrol for her rheumatoid arthritis for many years.  She recently increased the dose from 4 mg / 2 mg every other day to 8 mg daily because of recurrent respiratory exacerbations.  She is asking for [facility] refills for obstructive lung disease.  She is getting Rituxan through Dr. Eda Paschal for rheumatoid arthritis.  Her next Rituxan is in September 2021.  She saw Dr. Stann Mainland in April 2021 and reviewed the note.  Her liver function test at the time was fine.  In terms of her ILD she continues on nintedanib 100 mg twice daily.  She says she has no side effects from the drug she is tolerating it quite well.  We discussed about increasing the dose but she wants to hold off because she just got a new supply.  However she is open to increasing the dose when the supply runs out.  She is due for liver function test today.  In terms of overall symptoms things have improved as can be seen below and the symptom score.Marland Kitchen  However her main concern is that of fatigue.  She is open to attending pulmonary rehabilitation.  She is not using oxygen at home.  Sometime back when we tested overnight oxygen desaturation test she did not desaturate.  Walking desaturation test today stable.  She does notice of note that her blood pressure has gone up.  She is working with her primary care physician on this.  She is on losartan.    OV 04/10/2020   Subjective:  Patient ID: Stacey [name], female , DOB: 02/28/1941, age 20 y.o. years. , MRN: [id number],  ADDRESS: [address] [place] [address] PCP  Shon Baton, MD Providers : Treatment Team:  Attending Provider: Brand Males, MD   Chief Complaint  Patient presents with   Follow-up    CT scan 8/23, has not increased Ofev, shortness of breath with exertion.    Rheumatoid arthritis with Joint s/ILD- Rituxan/steroids ([facility] uin[facility])  - progressive phenotype (CT 2017/2019 -> Oct 2020). Planned Ofev  start in Nov 2020 but did not start as of dec 2020. 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 Admitted 06/11/2019  - 06/16/2019. Rx with Remdesviri and Decadron.  Admitted August 2021 for respiratory infection not otherwise specified.  [place]  Small Hiatal hernia on CT Oct 2020  Rib fracture from fall Dec 2020: Musculoskeletal: There is nondisplaced fractures of the posterior left fifth, 6, 8, 9, and tenth left ribs. There is unchanged slight superior compression deformities of the T5, T6, T10 vertebral bodies with less than 25% loss in height.Associated pulmnary contusion +  -On Reclast once a year through Dr. JenEda Paschal HPI MarTamala [name] 20o. -returns for follow-up.  Last week by myself early June 2021.  Then after that in early August 2020 when she got admitted for respiratory infection not otherwise specified.  She was hypoxemic  on room air.  This followed a trip to [place].  In the follow-up phase end of August 2020 when she had high-resolution CT chest that showed increased groundglass opacities but when compared to a CT from 8 months ago.  Also chronic ILD had worsened.  I personally visualized the image at this point in time she is improved from this admission she is not using oxygen at all.  In fact walking desaturation test shows near baseline.  Nevertheless overall her symptom scores have declined over time ILD symptom score is listed  below.  She is really concerned about several issues.  In terms of her ILD she was inquiring about escalating to 150 mg twice daily.  Even at 100 mg twice daily she is significantly symptomatic in terms of nonrespiratory issues such as nausea and dizziness and fatigue.  I did caution her that these could all get worse.  Therefore she is just opted to be at 100 mg twice daily  -Recurrent admissions for respiratory issues [I did address that some of this was Covid and fall related and not necessarily her usual summer exacerbation].  Nevertheless, I asked about exposures at home.  She does not have a feather blanket or feather pillow or feather jacket.  She denies any mold or bird exposure or mildew exposure at home.  Does not do any gardening or wind instruments.  She is immunosuppressed and I did notice that she is not on Bactrim for PCP prophylaxis.  In 2019 when we checked G6PD the lab could not process this lab.  She is interested in Bactrim prophylaxis.  She is interested in okay getting G6PD rechecked   -She is dealing with significant amount of dizziness.  Neurology is sending her to neuro rehab.  It is believed it is multifactorial.  I reviewed her recent CT head angio report.  I personally visualized the image   -She is also worried about cough.  Currently cough is tolerable but she says prior to her exacerbations cough gets worse.  We did discuss with her that if she is aspirating which she denied.  We did discuss about the possibility of starting Bactrim and monitoring her cough and she is fine with that.  I did review old records Lungs/Pleura: Biapical pleuroparenchymal scarring. Patchy and largely peripheral peribronchovascular ground-glass, increased from 07/03/2019. Underlying subpleural reticulation, traction bronchiectasis/bronchiolectasis and scattered honeycombing, similar to minimally increased from 07/03/2019. Calcified granulomas. No pleural fluid. Airway is unremarkable. No air  trapping.   Upper Abdomen: Visualized portions of the liver, adrenal glands, kidneys, spleen, pancreas, stomach and bowel are unremarkable with the exception of a small hiatal hernia. Cholecystectomy. Calcified upper abdominal lymph nodes.   Musculoskeletal: Degenerative changes in the spine. No worrisome lytic or sclerotic lesions.   IMPRESSION: 1. Increased patchy pulmonary parenchymal ground-glass may be due to an atypical/viral pneumonia, including due to COVID-19. 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 2. Aortic atherosclerosis (ICD10-I70.0). Coronary artery calcification.     Electronically Signed   By: Lorin Picket M.D.   On: 03/04/2020 12:58    OV 08/01/2020  Subjective:  Patient ID: Stacey [name], female , DOB: 1941-03-07 , age 69 y.o. , MRN: [id number] , ADDRESS: [address] [place] [address] PCP Shon Baton, MD Patient Care Team: Shon Baton, MD as PCP - General (Internal Medicine) Buford Dresser, MD as PCP - Cardiology (  Cardiology)  This Provider for this visit: Treatment Team:  Attending Provider: Brand Males, MD   Rheumatoid arthritis with Joint s/ILD- Rituxan/steroids ([facility] uinviesty)  - progressive phenotype (CT 2017/2019 -> Oct 2020). Planned Ofev start in Nov 2020 but did not start as of dec 2020. 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 Admitted 06/11/2019  - 06/16/2019. Rx with Remdesviri and Decadron.  Admitted August 2021 for respiratory infection not otherwise specified.  [place]  Small Hiatal hernia on CT Oct 2020  Rib fracture from fall Dec 2020: Musculoskeletal: There is nondisplaced fractures of the posterior left fifth, 6, 8, 9, and tenth left ribs. There is unchanged slight superior compression deformities of the T5, T6, T10 vertebral bodies with less than 25% loss in height.Associated pulmnary contusion +  -On Reclast once a year through Dr. JenEda Paschal 08/01/2020 -   Chief Complaint  Patient presents with   Follow-up    Doing well     HPI MarRHEALYNN MYHRE 12o. -returns for follow-up.  She says since last visit she has lost weight.  In fact tracking her weight it appears she is definitely lost weight.  She has occasional intermittent nausea once every few weeks this is because she does not time her nintedanib well with food.  She has a low appetite as well.  She tried to go up on the Internet but she is unable to.  She takes a low-dose 100 mg twice daily.  She is on Rituxan and prednisone.  She is up-to-date with her COVID-vaccine.  Current shortness of breath standpoint she is stable.  Symptoms are stable.  CT Chest data  OV 10/22/2020  Subjective:  Patient ID: MarTamala Fothergillemale , DOB: 6/2Jul 09, 1942age [age]o. , MRN: [id number]: [address] [id number] RusShon BatonD Patient Care Team: RusShon BatonD as PCP - General (Internal Medicine) ChrBuford DresserD as PCP - Cardiology (Cardiology)  This Provider for this visit: Treatment Team:  Attending Provider: RamBrand MalesD    10/22/2020 -   Chief Complaint  Patient presents with   Follow-up    Still having shortness of breath with activity, denies  increase in severity.     Rheumatoid arthritis with Joint s/ILD- Rituxan/steroids ([facility] uinviesty)  - progressive phenotype (CT 2017/2019 -> Oct 2020). Planned Ofev start in Nov 2020 but did not start as of dec 2020. 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 Admitted 06/11/2019  - 06/16/2019. Rx with Remdesviri and Decadron.  - sp evushed x 2  Admitted August 2021 for respiratory infection not otherwise specified.  Following trip to [place].  Was hypoxemic.  Small Hiatal hernia on CT Oct 2020  Rib fracture from fall Dec 2020: Musculoskeletal: There is nondisplaced fractures of the posterior left fifth, 6, 8, 9, and tenth left ribs. There is unchanged slight superior compression deformities of the T5, T6, T10 vertebral bodies with less than 25% loss in height.Associated pulmnary contusion +  -On Reclast once a year through Dr. Eda Paschal  Normal ECHO Summer 2020   HPI ARELIE KUZEL 80 y.o. -returns for follow-up.  At this point in time for her ILD she is taking nintedanib 100 mg twice daily.  Last liver function test was in December 2021.  She is now attending pulmonary rehabilitation.  She is not sure it is helping her.  She tells me that she does not desaturate at pulmonary rehabilitation so she not using her oxygen.  Review of the records indicate same but it appears that today her dyspnea is worse in terms of symptom score although she is denying that.  Walking desaturation  test in the office today with a forehead probe suggest that pulse ox is declined.  Her pulmonary function test also shows 5% FVC decline.  She was surprised by this.  In talking to her realize that for coexistent obstructive lung disease she is no longer taking Dulera.  She is willing to take inhaler which she will have to twist instead of having to pump with her fingers because she has rheumatoid arthritis.  She is no longer losing weight though.  She had questions about taking monoclonal antibody prophylaxis.  She is under the impression she needs to take it every 6 months with Rituxan dosing.  I did explain to her that in the presence of Rituxan the body is not able to respond actively to vaccine and that is why she is having prophylactic antibody.  She is now had 2 doses the last 1 being a few weeks ago first 1 was in January 2022.  I have written to the [facility] coordinator Wilber Bihari 27 monoclonal antibody prophylaxis schedule.  Expressed to patient that because of monoclonal antibody prophylaxis she did not do the vaccine.  Last echocardiogram was in summer 2020.      OV 12/23/2020  Subjective:  Patient ID: Stacey [name], female , DOB: [birth year] , age 32 y.o. , MRN: [id number] , ADDRESS: [place] [address] PCP Shon Baton, MD Patient Care Team: Shon Baton, MD as PCP - General (Internal Medicine) Buford Dresser, MD as PCP - Cardiology (Cardiology)  This Provider for this visit: Treatment Team:  Attending Provider: Brand Males, MD   Rheumatoid arthritis with Joint s/ILD- Rituxan/steroids [facility])  - progressive phenotype (CT 2017/2019 -> Oct 2020). Planned Ofev start in Nov 2020 but did not start as of dec 2020. LFT normal Jan 2021   - Rituxan via [facility]ollie Marrow Rheum -September 2021 + Medrol $Remove'4mg'xQvlaiY$ /$Remo'2mg'wZEEC$  QOD baseline many decades (on $RemoveBef'8mg'LRbfmyOjKZ$  per day since feb 2021 due to recurrent asthma flares)  - nintedanib for her ILD she started it on  July 31, 2019 - started $RemoveB'100mg'SfOVybJY$  twice daily (started low dose du eto side effect concern)  - ILD PRO registry study - since 08/16/19  Athma/obstructive lung disease phenotype  on Dulera/Singulair  Post herpetic neuralgia - on elavil, gabapentin, effexor  Coronary Artery Calcification - 1 vessel -  S/p - covid-19 on 05/31/2019. Admitted 06/11/2019  - 06/16/2019. Rx with Remdesviri and Decadron.  - sp evushed x 2  Admitted August 2021 for respiratory infection not otherwise specified.  Following trip to [place].  Was hypoxemic.  Small Hiatal hernia on CT Oct 2020  Rib fracture from fall Dec 2020: Musculoskeletal: There is nondisplaced fractures of the posterior left fifth, 6, 8, 9, and tenth left ribs. There is unchanged slight superior compression deformities of the T5, T6, T10 vertebral bodies with less than 25% loss in height.Associated pulmnary contusion +  -On Reclast once a year through Dr. Eda Paschal  Normal ECHO Summer 2020 -> possible [facility] May 2021 RSVP 30s  12/23/2020 -   Chief Complaint  Patient presents with   Follow-up    PFT performed today. Pt states she has been doing okay since last visit and denies any complaints.     HPI JAMARIS BIERNAT 80 y.o. -returns for follow-up.  There is concern for progression.  Last visit we have made sure she added Breo.  She says she feels better.  In fact symptom score is better.  However pulmonary function test shows continued decline.  She is on the low-dose nintedanib.  She says if she does not take the nintedanib exactly with food and even if she takes it 15 to 20 minutes later she will have nausea.  Today she had nausea but otherwise if she is diligent she can maintain.  I informed her the pulmonary function test shows continued decline.  She is on Rituxan prednisone and low-dose nintedanib.  She initially said she will not be able to tolerate the higher dose nintedanib but after realizing that her nausea is mild and her weight loss  is stable she says she is willing to give the higher dose nintedanib and attend.  I was able to get her some donor sample of nintedanib.  The lot number of this is [id number].  Expiration date is November 2023.  The serial number is [id number].  The GT IN number is [id number]       SYMPTOM SCALE - ILD 03/27/2019  05/24/2019 (2-3 weeks pre cpovid)  08/03/2019 Post covid Post fall rib # 12/18/2019  04/10/2020 142# resp virus admit aug 2021 nos 08/01/2020 134#  10/22/2020 136# Now doing rehab 12/23/2020 136#  O2 use ra       ra  Shortness of Breath 0 -> 5 scale with 5 being worst (score 6 If unable to do)         At rest 0 $Remo'2 3 2 3 'hqZVL$ 0 3 0  Simple tasks - showers, clothes change, eating, shaving 0 $RemoveB'3 2 3 4 1 3 2  'jlpdWhaU$ Household (dishes, doing bed, laundry) $RemoveBefor'2 4 5 3 4 3 3 3  'pjEAAvXlDpGn$ Shopping $RemoveBefo'2 3 6 4 4 4 4 3  'ZwJndwGmyBq$ Walking level at own pace $Remove'2 3 4 4 3 2 4 2  'yATogQc$ Walking up Stairs $RemoveB'3 3 5 4 4 4 4 3  'czLwMsLY$ Total (40 - 48) Dyspnea Score $RemoveBefor'9 17 25 20 22 14 21 13  'AgqhSNHciMrM$ How bad is your cough? $RemoveBef'2 2 3 1 2 'UlaCRpOuys$ 0 0 0  How bad is your fatigue 3 3.5 5 3.$Remov'5 3 4 3 2  'LvDzeO$ nause    0 1 1 0 1  vomit    0 1 1 0 1  diarrhea  0 1 0 0 0  anxiety    0 2 0 1 0  depression    0 1 0 0 0     Simple office walk  feet x  3 laps goal with forehead probe 04/12/2018  07/21/2018  05/24/2019  12/18/2019  04/10/2020  08/01/2020  10/22/2020   O2 used Room air - off o2 x 10 min Room air  Room air ra ra ra ra  Number laps completed 3 x 185 feet 3 x 250 feet    r   Comments about pace normal normal   Slow, no walker Slow . No walker.  Slow. No walker. No cane  Resting Pulse Ox/HR 99% and 72/min 97% and 74/min 97% and 81/min 97% and 76/m 96% and 88/min 94% and 83 96% and HR 79  Final Pulse Ox/HR 98% and 95/min 96% and 96/min 94% and 107/min 93% and 94 92% and 97 90% and 100 89% and 104/min  Desaturated </= 88% no no no no no yes yes  Desaturated <= 3% points no no Yes, 3 pints Ues, 4 points Yes,  4 points Yes, 4 points Yes, 7 points  Got Tachycardic >/= 90/min yes  yes yes yes yes yes yes  Symptoms at end of test No complaint Mild dyspnea Mod dyspnea Some dyspnea dyspnea dyspnie cat 3d Dyspneic moderate   Miscellaneous comments improed from hospital Same v improved woprse same  Wobbly gait baseline Needed 2 break     PFT  PFT Results Latest Ref Rng & Units 12/23/2020 10/22/2020 09/10/2020 04/08/2020 12/12/2019 03/27/2019 07/21/2018  FVC-Pre L 1.66 1.73 1.76 1.83 1.90 1.79 2.18  FVC-Predicted Pre % 60 63 64 66 68 64 77  FVC-Post L - - - - - - -  FVC-Predicted Post % - - - - - - -  Pre FEV1/FVC % % 71 76 75 74 78 79 79  Post FEV1/FCV % % - - - - - - -  FEV1-Pre L 1.17 1.30 1.32 1.35 1.48 1.42 1.72  FEV1-Predicted Pre % 57 64 64 66 71 68 81  FEV1-Post L - - - - - - -  DLCO uncorrected ml/min/mmHg 14.98 13.67 15.38 14.39 13.91 15.04 -  DLCO UNC% % 77 70 79 74 71 77 -  DLCO corrected ml/min/mmHg 14.98 13.80 15.38 15.49 13.91 - -  DLCO COR %Predicted % 77 71 79 79 71 - -  DLVA Predicted % 86 80 102 108 86 96 -  TLC L - - - - - - -  TLC % Predicted % - - - - - - -  RV % Predicted % - - - - - - -    ECHO 11/3120  IMPRESSIONS     1. Left ventricular ejection fraction, by estimation, is 65 to 70%. The  left ventricle has normal function. The left ventricle has no regional  wall motion abnormalities. Left ventricular diastolic parameters are  consistent with Grade I diastolic  dysfunction (impaired relaxation). Elevated left ventricular end-diastolic  pressure. The E/e' is 51. The average left ventricular global longitudinal  strain is -18.4 %.   2. Right ventricular systolic function is normal. The right ventricular  size is normal. There is mildly elevated pulmonary artery systolic  pressure. The estimated right ventricular systolic pressure is 92.4 mmHg.   3. The mitral valve is abnormal. Mild mitral valve regurgitation.   4. The aortic valve is tricuspid. Aortic valve regurgitation is not  visualized. Mild aortic valve sclerosis is present,  with no  evidence of  aortic valve stenosis.   5. The inferior vena cava is normal in size with greater than 50%  respiratory variability, suggesting right atrial pressure of 3 mmHg.   Comparison(s): Changes from prior study are noted. 12/18/18 EF 60-65%. PA  pressure .    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   reports that she has never smoked. She has never used smokeless tobacco.  Past Surgical History:  Procedure Laterality Date   CHOLECYSTECTOMY  2011   IR ANGIO INTRA EXTRACRAN SEL COM CAROTID INNOMINATE BILAT MOD SED  06/18/2020   IR ANGIO VERTEBRAL SEL SUBCLAVIAN INNOMINATE UNI R MOD SED  06/18/2020   IR ANGIO VERTEBRAL SEL VERTEBRAL UNI L MOD SED  06/18/2020   WISDOM TOOTH EXTRACTION      Allergies  Allergen Reactions   Penicillins Other (See Comments)    Reaction unknown occurred during childhood Has patient had a PCN reaction causing immediate rash, facial/tongue/throat swelling, SOB or lightheadedness with hypotension: Unknown Has patient had a PCN reaction causing severe rash involving mucus membranes or skin necrosis: Unknown Has patient had a PCN reaction that required hospitalization: No Has patient had a PCN reaction occurring within the last 10 years: No If all of the above answers are "NO", then may proceed with Cephalosporin use.  Unsur   Remicade [Infliximab] Other (See Comments)    Reaction unknown   Sulfa Antibiotics Other (See Comments)    Reaction unknown    Immunization History  Administered Date(s) Administered   Fluad Quad(high Dose 65+) 04/23/2020   Influenza Split 04/12/2013, 04/12/2014, 03/14/2015, 04/23/2016   Influenza, High Dose  Seasonal PF 04/12/2017, 03/24/2018, 04/13/2019   Influenza-Unspecified 04/03/2013, 04/22/2017   Moderna Sars-Covid-2 Vaccination 08/25/2019, 09/22/2019, 03/07/2020   Pneumococcal Conjugate-13 04/03/2013, 09/28/2014   Pneumococcal Polysaccharide-23 07/13/2012, 07/24/2013   Td 01/11/2018   Tdap 12/10/2017    Family History  Problem Relation Age of Onset   Asthma Mother    Anemia Mother    Polymyalgia rheumatica Mother    COPD Father    Pulmonary fibrosis Father    Breast cancer Maternal Grandmother 60     Current Outpatient Medications:    amitriptyline (ELAVIL) 25 MG tablet, Take 25 mg by mouth at bedtime., Disp: , Rfl: 0   beta carotene w/minerals (OCUVITE) tablet, Take 1 tablet by mouth daily., Disp: , Rfl:    Biotin 1000 MCG tablet, Take 1,000 mcg by mouth daily., Disp: , Rfl:    cholecalciferol (VITAMIN D) 1000 UNITS tablet, Take 1,000 Units by mouth daily., Disp: , Rfl:    Estradiol 10 MCG TABS vaginal tablet, Place 10 mcg vaginally daily as needed (dryness). , Disp: , Rfl:    fluticasone furoate-vilanterol (BREO ELLIPTA) 100-25 MCG/INH AEPB, Inhale 1 puff into the lungs daily., Disp: 60 each, Rfl: 5   gabapentin (NEURONTIN) 300 MG capsule, Take 600 mg by mouth at bedtime., Disp: , Rfl:    losartan (COZAAR) 50 MG tablet, Take 1 tablet (50 mg total) by mouth daily., Disp: 30 tablet, Rfl: 1   methylPREDNISolone (MEDROL) 4 MG tablet, Take 1 tablet (4 mg total) by mouth every other day. Alternate with the 2mg  dose (Patient taking differently: Take 4 mg by mouth daily.), Disp:  , Rfl:  OFEV 100 MG CAPS, TAKE 1 CAPSULE BY MOUTH 2 TIMES DAILY., Disp: 180 capsule, Rfl: 1   pantoprazole (PROTONIX) 40 MG tablet, Take 1 tablet (40 mg total) by mouth 2 (two) times daily before a meal., Disp: 180 tablet, Rfl: 3   pravastatin (PRAVACHOL) 20 MG tablet, Take 1 tablet (20 mg total) by mouth every evening., Disp: 90 tablet, Rfl: 3   riTUXimab (RITUXAN) 100 MG/10ML injection, Inject 100 mg into  the vein every 6 (six) months. , Disp: , Rfl:    sulfamethoxazole-trimethoprim (BACTRIM DS) 800-160 MG tablet, Take 1 tablet by mouth 3 (three) times a week. Take on Mon, Wed, Fri (Patient taking differently: Take 1 tablet by mouth every Monday, Wednesday, and Friday.), Disp: 12 tablet, Rfl: 5   venlafaxine XR (EFFEXOR-XR) 75 MG 24 hr capsule, Take 75 mg by mouth at bedtime. , Disp: , Rfl:    albuterol (VENTOLIN HFA) 108 (90 Base) MCG/ACT inhaler, Inhale 2 puffs into the lungs every 6 (six) hours as needed for wheezing or shortness of breath., Disp: , Rfl:    benzonatate (TESSALON) 200 MG capsule, Take 1 capsule (200 mg total) by mouth 3 (three) times daily as needed for cough., Disp: 30 capsule, Rfl: 2   ipratropium-albuterol (DUONEB) 0.5-2.5 (3) MG/3ML SOLN, Take 3 mLs by nebulization 3 (three) times daily for 5 days. (Patient taking differently: Take 3 mLs by nebulization every 4 (four) hours as needed (shortness of breath). ), Disp: 45 mL, Rfl: 1   lactulose (CHRONULAC) 10 GM/15ML solution, Take 45 mLs (30 g total) by mouth 2 (two) times daily., Disp: 1892 mL, Rfl: 1   mometasone-formoterol (DULERA) 100-5 MCG/ACT AERO, Inhale 2 puffs into the lungs 2 (two) times daily., Disp: 39 g, Rfl: 0   polyethylene glycol (MIRALAX / GLYCOLAX) 17 g packet, Take 17 g by mouth 2 (two) times daily. (Patient taking differently: Take 17 g by mouth daily as needed for moderate constipation.), Disp: 72 each, Rfl: 1   senna-docusate (SENOKOT-S) 8.6-50 MG tablet, Take 2 tablets by mouth 2 (two) times daily. (Patient taking differently: Take 1 tablet by mouth daily as needed for mild constipation.), Disp: 120 tablet, Rfl: 1      Objective:   Vitals:   12/23/20 1357  BP: 118/74  Pulse: 77  SpO2: 98%  Weight: 136 lb 3.2 oz (61.8 kg)  Height: $Remove'5\' 5"'zESBHvM$  (1.651 m)    Estimated body mass index is 22.66 kg/m as calculated from the following:   Height as of this encounter: $RemoveBeforeD'5\' 5"'MUsLwBDYgEBjSl$  (1.651 m).   Weight as of this encounter:  136 lb 3.2 oz (61.8 kg).  $Rem'@WEIGHTCHANGE'omNG$ @  [employer]   12/23/20 1357  Weight: 136 lb 3.2 oz (61.8 kg)     Physical Exam   General: No distress. Looks well Neuro: Alert and Oriented x 3. GCS 15. Speech normal Psych: Pleasant Resp:  Barrel Chest - no.  Wheeze - no, Crackles -extensive crackles present, No overt respiratory distress CVS: Normal heart sounds. Murmurs - no Ext: Stigmata of Connective Tissue Disease - no HEENT: Normal upper airway. PEERL +. No post nasal drip        Assessment:       ICD-10-CM   1. ILD (interstitial lung disease) ([place])  J84.9     2. 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   Plan - Increase nintedanib 150 mg twice daily with food  - take sample to give feedback -Continue Rituxan and steroids through the rheumatologist  - if ofev is intolerable or disease progresse will discuss with Dr Stann Mainland immuran or cellcept - continuie  breo 100 dosing daily once with albuterol as needed  - cotninue singulaire - hold off starting o2 per your wishes  - hold off discussion on right heart cath till we see how increased ofev tolerance goes   Follow-up -4 weeks telephone visit with APP to discuss ofev tolerance - 3 months do spirometry and DLCO and then 30 min visit with Jerre Vandrunen -  -Symptom score and simple walk test at follow-up    ( Level 05 visit: Estb 40-54 min   visit type: on-site physical face to visit  in total care time and counseling or/and coordination of care by this undersigned MD - Dr Brand Males. This includes one or more of the following on this same day  12/23/2020: pre-charting, chart review, note writing, documentation discussion of test results, diagnostic or treatment recommendations, prognosis, risks and benefits of management options, instructions, education, compliance or risk-factor reduction. It excludes time spent by the [facility] or office staff in the care of the patient. Actual time 40 min)   SIGNATURE    Dr. Brand Males, M.D., F.C.C.P,  Pulmonary and Critical Care Medicine Staff Physician, [place] Director - Interstitial Lung Disease  Program  [facility] [place] [facility] [place], [place], [address]  Pager: [phone], If no answer or between  15:00h - 7:00h: call [phone] Telephone: [phone]  2:59 PM 12/23/2020

## 2020-12-23 NOTE — Progress Notes (Signed)
Spirometry and Dlco done today. 

## 2020-12-31 ENCOUNTER — Encounter: Payer: Self-pay | Admitting: Cardiology

## 2021-01-14 ENCOUNTER — Ambulatory Visit: Payer: Medicare Other | Admitting: Internal Medicine

## 2021-01-14 ENCOUNTER — Other Ambulatory Visit: Payer: Self-pay | Admitting: Cardiology

## 2021-01-14 DIAGNOSIS — E7800 Pure hypercholesterolemia, unspecified: Secondary | ICD-10-CM

## 2021-01-15 ENCOUNTER — Ambulatory Visit (INDEPENDENT_AMBULATORY_CARE_PROVIDER_SITE_OTHER): Payer: Medicare Other | Admitting: Acute Care

## 2021-01-15 ENCOUNTER — Other Ambulatory Visit: Payer: Self-pay

## 2021-01-15 ENCOUNTER — Encounter: Payer: Self-pay | Admitting: Acute Care

## 2021-01-15 VITALS — BP 116/70 | HR 71 | Temp 97.7°F | Ht 65.0 in | Wt 135.8 lb

## 2021-01-15 DIAGNOSIS — J45909 Unspecified asthma, uncomplicated: Secondary | ICD-10-CM | POA: Diagnosis not present

## 2021-01-15 DIAGNOSIS — I2510 Atherosclerotic heart disease of native coronary artery without angina pectoris: Secondary | ICD-10-CM

## 2021-01-15 DIAGNOSIS — J849 Interstitial pulmonary disease, unspecified: Secondary | ICD-10-CM

## 2021-01-15 NOTE — Patient Instructions (Addendum)
  It is good to see you today. We will draw liver function tests today. Continue  nintedanib 150 mg twice daily with food             - We  will provide another donor pack of the 150 mg. -Continue Rituxan and steroids through the rheumatologist             - if ofev is intolerable or disease progresses will discuss with Dr Stann Mainland immuran or cellcept - continuie  breo 100 dosing daily once with albuterol as needed - Rinse mouth after use  - continue singulair - hold off starting o2 per your wishes - hold off discussion on right heart cath till we see how increased ofev tolerance goes - Appointment with Judson Roch NP 1 month for weight check.  - Follow up with Dr. Chase Caller 03/10/2021 at 2:30 pm - spirometry and DLCO and then 30 min visit with Ramaswamy 8/29 at 2:30 pm -If you find you are unable to take the medication , please call the office. Please contact office for sooner follow up if symptoms do not improve or worsen or seek emergency care

## 2021-01-15 NOTE — Progress Notes (Signed)
[facility]  Office Visit  80 year old female never smoker with Rheumatoid arthritis with Joint s/ILD-. She is followed by Dr. Chase Caller  80 year old female never smoker with Rheumatoid arthritis with Joint s/ILD- Rituxan/steroids ([facility]). She has had progression of ILD, worsening PFT's and increasing dyspnea 12/2020.  She was on a low dose of  nintedanib. Dr. Chase Caller has increased her dose of nintedanib to 150 mg BID  for her ILD. She is following up to see how she has tolerated the increase in medication dose, and to ensure her weight is stable. Marland Kitchen   History of Present Illness: 12/23/2020 -  Pt. Presents for follow up. She was seen by Dr. Chase Caller 12/23/2020. There was concern for progression of her disease based on PFT's and , but they added Breo to her regimen and she feels better. However PFT's have shown decline. She is on Rituxan prednisone and low-dose nintedanib. She initially did not feel she could tolerate an increase in nintedanib, but as her weight is stable, she is willing to give a higher dose a try. She was given donor medication  The lot number of this is [id number].  Expiration date is November 2023.  The serial number is [id number].  The GT IN number is [id number]. She presents today for follow up. Pt. States she has tolerated the change.She does have some nausea. She has had a weight loss of about 7 ounces over the last month. She states she has had some vomiting over the last month about once a week. She is taking her medication with food. She has been exercising out at [facility] and had to stop and vomit once due to nausea.She wants to continue to try thre higher dose of medication despite nausea.  We did discuss that if she cannot tolerate the 150 mg twice daily, we have the option of Cellcept or  immuran.  I spoke with Dr. Chase Caller, he is in agreement with continuing the 150 mg BID dosing  with a weight check in 1 month, and follow up appointment with him  03/10/2021 at 2:30pm. Dyspnea score is 19, so higher than a month ago. She states she is not aware of worsening shortness of breath over the last month, but this was her score today. Last LFT's were 10/2020. They were normal.We will recheck today on the higher dose of medication.  We did discuss using oxygen. She does snot want to even consider that at this point. We will need to revisit this issue.    Observations/Objective: Filed Weights   01/15/21 1430  Weight: 135 lb 12.8 oz (61.6 kg)    Weight 12/23/2020>> 136.2 lbs   Rheumatoid arthritis with Joint s/ILD- Rituxan/steroids ([facility] uin[facility])             - progressive phenotype (CT 2017/2019 -> Oct 2020). Planned Ofev start in Nov 2020 but did not start as of dec 2020. LFT normal Jan 2021              - Rituxan via [facility] -September 2021 + Medrol 4mg /2mg  QOD baseline many decades (on 8mg  per day since feb 2021 due to recurrent asthma flares)             - nintedanib for her ILD she started it on July 31, 2019 - started 100mg  twice daily (started low dose du eto side effect concern)             - ILD PRO registry study - since  08/16/19  Athma/obstructive lung disease phenotype on Dulera/Singulair   Post herpetic neuralgia - on elavil, gabapentin, effexor   Coronary Artery Calcification - 1 vessel -   S/p - covid-19 on 05/31/2019. Admitted 06/11/2019  - 06/16/2019. Rx with Remdesviri and Decadron.             - sp evushed x 2   Admitted August 2021 for respiratory infection not otherwise specified.  Following trip to [place].  Was hypoxemic.   Small Hiatal hernia on CT Oct 2020   Rib fracture from fall Dec 2020: Musculoskeletal: There is nondisplaced fractures of the posterior left fifth, 6, 8, 9, and tenth left ribs. There is unchanged slight superior compression deformities of the T5, T6, T10 vertebral bodies with less than 25% loss in height.Associated pulmnary contusion +             -On Reclast once a year through Dr.  Eda Paschal   Normal ECHO Summer 2020 -> possible [facility] May 2021 RSVP 30s    SYMPTOM SCALE - ILD 03/27/2019   05/24/2019 (2-3 weeks pre cpovid)   08/03/2019 Post covid Post fall rib # 12/18/2019   04/10/2020 142# resp virus admit aug 2021 nos 08/01/2020 134#   10/22/2020 136# Now doing rehab 12/23/2020 136#  O2 use ra             ra  Shortness of Breath 0 -> 5 scale with 5 being worst (score 6 If unable to do)                At rest 0 2 3 2 3  0 3 0  Simple tasks - showers, clothes change, eating, shaving 0 3 2 3 4 1 3 2   Household (dishes, doing bed, laundry) 2 4 5 3 4 3 3 3   Shopping 2 3 6 4 4 4 4 3   Walking level at own pace 2 3 4 4 3 2 4 2   Walking up Stairs 3 3 5 4 4 4 4 3   Total (40 - 48) Dyspnea Score 9 17 25 20 22 14 21 13   How bad is your cough? 2 2 3 1 2  0 0 0  How bad is your fatigue 3 3.5 5 3.5 3 4 3 2   nause       0 1 1 0 1  vomit       0 1 1 0 1  diarrhea       0 1 0 0 0  anxiety       0 2 0 1 0  depression       0 1 0 0 0        Simple office walk  feet x  3 laps goal with forehead probe 04/12/2018   07/21/2018   05/24/2019   12/18/2019   04/10/2020   08/01/2020   10/22/2020    O2 used Room air - off o2 x 10 min Room air Room air ra ra ra ra  Number laps completed 3 x 185 feet 3 x 250 feet       r    Comments about pace normal normal     Slow, no walker Slow . No walker. Slow. No walker. No cane  Resting Pulse Ox/HR 99% and 72/min 97% and 74/min 97% and 81/min 97% and 76/m 96% and 88/min 94% and 83 96% and HR 79  Final Pulse Ox/HR 98% and 95/min 96% and 96/min 94% and 107/min 93% and 94 92%  and 97 90% and 100 89% and 104/min  Desaturated </= 88% no no no no no yes yes  Desaturated <= 3% points no no Yes, 3 pints Ues, 4 points Yes,  4 points Yes, 4 points Yes, 7 points  Got Tachycardic >/= 90/min yes yes yes yes yes yes yes  Symptoms at end of test No complaint Mild dyspnea Mod dyspnea Some dyspnea dyspnea dyspnie cat 3d Dyspneic moderate  Miscellaneous comments  improed from hospital Same v improved woprse same   Wobbly gait baseline Needed 2 break        PFT 12/2020            PFT Results Latest Ref Rng & Units 12/23/2020 10/22/2020 09/10/2020 04/08/2020 12/12/2019 03/27/2019 07/21/2018  FVC-Pre L 1.66 1.73 1.76 1.83 1.90 1.79 2.18  FVC-Predicted Pre % 60 63 64 66 68 64 77  FVC-Post L - - - - - - -  FVC-Predicted Post % - - - - - - -  Pre FEV1/FVC % % 71 76 75 74 78 79 79  Post FEV1/FCV % % - - - - - - -  FEV1-Pre L 1.17 1.30 1.32 1.35 1.48 1.42 1.72  FEV1-Predicted Pre % 57 64 64 66 71 68 81  FEV1-Post L - - - - - - -  DLCO uncorrected ml/min/mmHg 14.98 13.67 15.38 14.39 13.91 15.04 -  DLCO UNC% % 77 70 79 74 71 77 -  DLCO corrected ml/min/mmHg 14.98 13.80 15.38 15.49 13.91 - -  DLCO COR %Predicted % 77 71 79 79 71 - -  DLVA Predicted % 86 80 102 108 86 96 -  TLC L - - - - - - -  TLC % Predicted % - - - - - - -  RV % Predicted % - - - - - - -      ECHO 11/3120   IMPRESSIONS     1. Left ventricular ejection fraction, by estimation, is 65 to 70%. The  left ventricle has normal function. The left ventricle has no regional  wall motion abnormalities. Left ventricular diastolic parameters are  consistent with Grade I diastolic  dysfunction (impaired relaxation). Elevated left ventricular end-diastolic  pressure. The E/e' is 7. The average left ventricular global longitudinal  strain is -18.4 %.   2. Right ventricular systolic function is normal. The right ventricular  size is normal. There is mildly elevated pulmonary artery systolic  pressure. The estimated right ventricular systolic pressure is 03.4 mmHg.   3. The mitral valve is abnormal. Mild mitral valve regurgitation.   4. The aortic valve is tricuspid. Aortic valve regurgitation is not  visualized. Mild aortic valve sclerosis is present, with no evidence of  aortic valve stenosis.   5. The inferior vena cava is normal in size with greater than 50%  respiratory variability,  suggesting right atrial pressure of 3 mmHg.   Comparison(s): Changes from prior study are noted. 12/18/18 EF 60-65%. PA  pressure 73mmHg.    has a past medical history of Abnormal finding of blood chemistry, Asthma, H/O measles, H/O varicella, Hypertension, 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[place]), Leukoplakia of vulva (74/25/95), Lichen sclerosus (63/87/56), Low iron, Mitral valve prolapse, Osteoarthritis, Osteoporosis, Pneumonia, Post herpetic neuralgia, Rheumatoid arthritis(714.0), and Yeast infection.    reports that she has never smoked. She has never used smokeless tobacco.   Review Of Systems:  Constitutional:   +  weight loss, night sweats,  Fevers, chills, fatigue, or  lassitude.  HEENT:   No headaches,  Difficulty swallowing,  Tooth/dental problems, or  Sore throat,                No sneezing, itching, ear ache, nasal congestion, post nasal drip,   CV:  No chest pain,  Orthopnea, PND, swelling in lower extremities, anasarca, dizziness, palpitations, syncope.   GI  No heartburn, indigestion, abdominal pain, + nausea, + vomiting, No diarrhea, change in bowel habits, + loss of appetite, No bloody stools.   Resp: + shortness of breath with exertion less at rest.  No excess mucus, no productive cough,  + non-productive cough,  No coughing up of blood.  No change in color of mucus.  + occasional  wheezing.  No chest wall deformity  Skin: no rash or lesions.  GU: no dysuria, change in color of urine, no urgency or frequency.  No flank pain, no hematuria   MS:  No joint pain or swelling.  No decreased range of motion.  No back pain.  Psych:  No change in mood or affect. No depression or anxiety.  No memory loss.  Physical Exam:  General- No distress,  A&O x 3, pleasant ENT: No sinus tenderness, TM clear, pale nasal mucosa, no oral exudate,no post nasal drip, no LAN Cardiac: S1, S2, regular rate and rhythm, no murmur Chest: No wheeze/ rales/ dullness; no accessory muscle use, no nasal  flaring, no sternal retractions, + crackles 3/4 of the way up bilaterally Abd.: Soft Non-tender, ND, BS +, Body mass index is 22.6 kg/m. 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 Call patient with results Continue  nintedanib 150 mg twice daily with food             - We  will provide another donor pack of the 150 mg. -Continue Rituxan and steroids through the rheumatologist             - if ofev is intolerable or disease progresses will discuss with Dr Stann Mainland immuran or cellcept - continuie  breo 100 dosing daily once with albuterol as needed - Rinse mouth after use  - continue singulair - hold off starting o2 per your wishes - hold off discussion on right heart cath till we see how increased ofev tolerance goes - Appointment with Judson Roch NP 1 month for weight check.  - Follow up with Dr. Chase Caller 03/10/2021 at 2:30 pm - spirometry and DLCO and then 30 min visit with [place] 8/29 at 2:30 pm -If you find you are unable to take the medication , please call the office. - Please contact office for sooner follow up if symptoms do not improve or worsen or seek emergency care     Follow Up Instructions: - Appointment with Judson Roch NP 1 month for weight check.  - Follow up with Dr. Chase Caller 03/10/2021 at 2:30 pm - spirometry and DLCO and then 30 min visit with Ramaswamy 8/29 at 2:30 pm      I spent 45 minutes dedicated to the care of this patient on the date of  this encounter to include  pre-visit review of records, non-face-to-face time with the patient discussing conditions above, post visit ordering of testing, clinical documentation with the electronic health record, making appropriate referrals as documented, and communicating necessary information to the patient's healthcare team.    Magdalen Spatz, NP  01/15/2021

## 2021-02-17 ENCOUNTER — Ambulatory Visit: Payer: Medicare Other | Admitting: Acute Care

## 2021-03-07 ENCOUNTER — Other Ambulatory Visit: Payer: Self-pay

## 2021-03-07 ENCOUNTER — Ambulatory Visit (INDEPENDENT_AMBULATORY_CARE_PROVIDER_SITE_OTHER): Payer: Medicare Other | Admitting: Internal Medicine

## 2021-03-07 DIAGNOSIS — J849 Interstitial pulmonary disease, unspecified: Secondary | ICD-10-CM

## 2021-03-07 DIAGNOSIS — J45909 Unspecified asthma, uncomplicated: Secondary | ICD-10-CM | POA: Diagnosis not present

## 2021-03-07 LAB — PULMONARY FUNCTION TEST
DL/VA % pred: 84 %
DL/VA: 3.43 ml/min/mmHg/L
DLCO cor % pred: 66 %
DLCO cor: 12.94 ml/min/mmHg
DLCO unc % pred: 66 %
DLCO unc: 12.94 ml/min/mmHg
FEF 25-75 Pre: 0.91 L/sec
FEF2575-%Pred-Pre: 63 %
FEV1-%Pred-Pre: 64 %
FEV1-Pre: 1.28 L
FEV1FVC-%Pred-Pre: 98 %
FEV6-%Pred-Pre: 69 %
FEV6-Pre: 1.78 L
FEV6FVC-%Pred-Pre: 105 %
FVC-%Pred-Pre: 66 %
FVC-Pre: 1.78 L
Pre FEV1/FVC ratio: 72 %
Pre FEV6/FVC Ratio: 100 %

## 2021-03-07 NOTE — Progress Notes (Signed)
Spirometry and Dlco done today. 

## 2021-03-10 ENCOUNTER — Encounter: Payer: Self-pay | Admitting: Internal Medicine

## 2021-03-10 ENCOUNTER — Ambulatory Visit (INDEPENDENT_AMBULATORY_CARE_PROVIDER_SITE_OTHER): Payer: Medicare Other | Admitting: Internal Medicine

## 2021-03-10 ENCOUNTER — Other Ambulatory Visit: Payer: Self-pay

## 2021-03-10 VITALS — BP 124/60 | HR 70 | Temp 97.7°F | Ht 66.0 in | Wt 131.8 lb

## 2021-03-10 DIAGNOSIS — J45909 Unspecified asthma, uncomplicated: Secondary | ICD-10-CM

## 2021-03-10 DIAGNOSIS — R634 Abnormal weight loss: Secondary | ICD-10-CM

## 2021-03-10 DIAGNOSIS — J8489 Other specified interstitial pulmonary diseases: Secondary | ICD-10-CM | POA: Diagnosis not present

## 2021-03-10 DIAGNOSIS — I2510 Atherosclerotic heart disease of native coronary artery without angina pectoris: Secondary | ICD-10-CM

## 2021-03-10 DIAGNOSIS — M359 Systemic involvement of connective tissue, unspecified: Secondary | ICD-10-CM

## 2021-03-10 DIAGNOSIS — R112 Nausea with vomiting, unspecified: Secondary | ICD-10-CM

## 2021-03-10 DIAGNOSIS — T50905A Adverse effect of unspecified drugs, medicaments and biological substances, initial encounter: Secondary | ICD-10-CM

## 2021-03-10 MED ORDER — ONDANSETRON HCL 4 MG PO TABS: 4.0000 mg | ORAL_TABLET | Freq: Three times a day (TID) | ORAL | 0 refills | Status: DC | PRN

## 2021-03-10 NOTE — Progress Notes (Signed)
Brief patient profile:  56 yowf  never smoker with allergies/inhalers as child outgrew by [employer] then  RA since around 2000  Prednisone  x decades and prev eval by Dr Joya Gaskins around 2004 for sob resolved s maint rx and referred 05/26/2013 by Dr Shelia Media for bronchitis and abn cxr   History of Present Illness  05/26/2013 1st [facility] office visit/ Wert cc June 2014 dx pna  In [place] and remicade stopped and 100% better and placed arencia in September 2014  then abruptly worse first week in November with cough green sputum s nasal symptoms, fever low grade and no cp or cough and completely recovered prior to [place] does not recall abx but issue is why keeps getting sick and abn CT Chest (see below).   Arthritis symptoms well controlled at present on Rx for RA rec Nexium 40 mg Take 30-60 min before first meal of the day and add pepcid 20 mg one at bedtime whenever coughing.     10/19/2017  f/u ov/Wert re:  RA lung dz  Chief Complaint  Patient presents with   Follow-up    Cough is much improved, but has not resolved yet. Cough is non prod. She has not had to use her neb.   Dyspnea:  Not limited by breathing from desired activities  But some doe x steps Cough: daytime > noct dry  Sleep: fine  SABA use:  No saba Medrol 4 mg a/w 52m per day/ ok control of arthritis  rec Start back on gabapentin up to 300 mg each am  in addition to the the two at bedtime  If not better increase the medrol to 8 mg daily until bettter then taper back to where you      01/10/2018  f/u ov/Wert re:  RA  Lung dz Medrol  4 mg  One alternating with a half Chief Complaint  Patient presents with   Follow-up    PFT's done. Her breathing has been gradually worsening since the last visit. She has occ cough- non prod.   Dyspnea gradually worse since last ov:  MMRC1 =  MMRC3 = can't walk 100 yards even at a slow pace at a flat grade s stopping due to sob    Gradually x 3 m / more fatigue / no change in  arthritis  Cough: not an issue rec Protonix 40 mg Take 30-60 min before first meal of the day  GERD diet   01/17/2018 acute extended ov/Wert re: cough on medrol 4 mg  One a/w one half  Chief Complaint  Patient presents with   Acute Visit    started coughing 01/11/18- occ prod with minimal green sputum.  She states also wheezing and having increased SOB.    abruptly worse 01/11/18 with severe 24/7 coughing >>  prod min green mucus esp in am/ assoc with subjective wheeze and did not follow previous contingencies re flutter / saba/ increase medrol and admits she does not rember those written instructions nor how to use the neb provided .  No fever/ comfortable at rest sitting  rec For cough > mucinex dm 1200 mg every 12 hours and cough into the flutter valve as much as possible  Doxycycline 100 mg twice daily x 10 days with glass of water Medrol 425mx 2 now and take 2  daily until cough is better then 1 daily x 5 days and then resume the previous dose  Shortness of breath/ wheezing/ still  coughing > albuterol neb every 4 hours as needed      01/20/2018 acute extended ov/Wert re: refractory cough and sob 01/11/18 Chief Complaint  Patient presents with   Acute Visit    she is not feeling better, coughing , very SOB, wheezing  mucus now clear/scant  on doxy/ neb machine not working (tube would not plug into the side s adequate force and she was not capable of applying it due to RA hands. Cough/ wheeze/ sob 24/7 / flutter not helping/ can't lie down at hs   rec While coughing protonix 40 mg Take 30- 60 min before your first and last meals of the day  Shortness of breath/ wheezing/ still coughing > albuterol neb every 4 hours as needed  Depomedrol 120 mg IM and medrol 32 mg daily x 2 days,  then 16 mg x 3 days,  Then 8 mg x 4 days , then resume the 4 mg daily  For severe cough > tylenol 3# one every 4 hours if needed  Go to ER if condition worsens on above plan       Date of admission: 01/22/2018              Date of discharge: 01/27/2018   History of present illness: As per the H and P dictated on admission, " Myleah Cavendish  is a 80 y.o. female, w Rheumatoid arthritis, ILD Asthma, apparently c/o increase in dyspnea this evening. Dry cough.   Denies fever, chills, cp, palp,  N/v, diarrhea, brbpr, black stool.   Pt notes recently being given steroid injection in office as well as being placed on doxycycline. This might have helped slightly but pt worse  Hospital Course:  Summary of her active problems in the hospital is as following. 1 dyspnea/hypoxemia/ILD Concerned that likely GERD Is causing ILD. Patient with cough.   Patient with complaints of awakening with cough and also with oral intake which is slightly improved since 01/24/2018. assessed by speech therapy and speech therapy raising concern of esophageal component but no signs of aspiration.   2D echo with a EF of 55 to 60% with no wall motion abnormalities, grade 1 diastolic dysfunction.  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 Pulmonary felt that the patient should be on scheduled Reglan. I have placed the patient on scheduled potassium before sleep. Continue steroids on discharge continue Mucinex and Claritin as well as inhalers. Patient will follow-up with pulmonary outpatient   2.  Gastroesophageal reflux disease Continue PPI and H2 blocker.  I changed PPI to [facility].   3.  Rheumatoid arthritis Outpatient follow-up.     4.  Anxiety Continue Effexor.       All other chronic medical condition were stable during the hospitalization.  Patient was ambulatory without any assistance. On the day of the discharge the patient's vitals were stable , and no other acute medical condition were reported by patient. the patient was felt safe to be discharge at home with family.   Consultants: PCCM  Procedures: Echocardiogram       03/21/2018  f/u ov/Wert re:   S/p admit was transiently  better  and downhill since Labor day on medrol 4 mg daily  Last orencia on Sept 4th 2019  Chief Complaint  Patient presents with   Acute Visit    Per patient, she has had a dry cough since July 2019. She has been wheezing as well. Increased fatigue. Body aches. Denies any fever or chills.  Dyspnea:  MMRC4  = sob if tries to leave home or while getting dressed   Cough: harsh/ hacking mostly dry/ has flutter not using    SABA use: not much better with rx   No obvious day to day or daytime variability or assoc excess/ purulent sputum or mucus plugs or hemoptysis or cp or chest tightness, subjective wheeze or overt sinus or hb symptoms.     Also denies any obvious fluctuation of symptoms with weather or environmental changes or other aggravating or alleviating factors except as outlined above   No unusual exposure hx or h/o childhood pna/ asthma or knowledge of premature birth.   INpatient consult 03/26/18 80 year old with rheumatoid arthoritis.At baseline the patient lives at home with her husband and is independent of ADLs.  Has been on many immune suppressants over decades and curently on orencia x 4 year and prednisone. Does not recollect being on bactrim/dapsone. Chart mentions BOOP/MAI in 2001 but she denies this. 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 Got admitted. Rx with steroids. Per Notes - clnical suspicion of  arytenoid inflmmation related wheeze noticed (she also reports asthma NOS). Follolwup with ENT recommended (but not seen one as yet). She also appears to have passed swallow with rec for regular diet with thin liquids but did to have mild eso stricture and reflux during testing . PFT shows 10% FVC decline for first ime. CT chest at this tme (aug 2019) showed new rLL infiltrate.  ECHO July 2019 without evicence of elevated  PASP and saw cards [facility] June 2019 and was considered to have worsenin dyspnea due to [facility] issues (reports stress test at [facility] that was normal but I cannot see it)   She reports after discharge she got better but in last several weeks has deteriorated with cough and dyspnea. There is new hypoxemia (currently RA with nail polish and poor circulation  - 89% pulse ox) needing 2L [place]. Per [facility] improved with steroids and abx. CTA 03/24/2018 => shows that RLL inifltrate has improved . Other chronic ILD changes + and small  Hiatal hernia + witthout change.   Review of lab work does show eosinophilia at time of admision   EVENTS 03/21/18 - IgE -5, blood allegy panel - negative, 03/23/2018 - - admit . HIGH EOS 2300, ESR 48, BNP 89 , HIV neg 9/12- PCT negative, RVP negative 9/14 -  PCT < 0.1, Urine strep - negative, MRSA PCR - positive. IgE - normal 4, Blood allergy panel repeat - negative 03/26/2018 - leading consideration for airway (BO in RA +/- asthma) related flare either due to MRSA bronchitis or clinical suspicion of arytenoid inflmmation +/- GERD relatd flare (she has small hiatal hernia)  +/- ? Dysphagia  up causing mild hypoxemia acute resp failure, wheeze . Allergy and IgE blood work negative thought. Patient reports being better but says she is choking on drinkin water [facility] MD says wheeze improved significantly with steroids.  RN says was down to RA yesterday evening but needed 1L [place] at sleep. Today -Room air at rest 94% and desaturated to 86% walking 60 feet 03/27/2018  - better. Off o2 at rest. STill coughs with water and when lies down.  Husband at bedside. Both requesting ILD clinic followup . Desaturated t 79% walking 90 feet.   OV  04/12/2018  Subjective:  Patient ID: Stacey [name], female , DOB: 03-25-1941 , age 34 y.o. , MRN: [id number] , ADDRESS: [place] [address]   04/12/2018 -   Chief Complaint  Patient presents with   Consult    Pt is a former MW pt.  Pt  denies any current complaints of cough, SOB, or CP but states the cough she originally had ended her up in the hosp 9/11-9/17 with dx acute respiratory failure. Pt does wear 2pulse with exertion and also wears 2L continuous when at home.     HPI KENDRA GRISSETT 80 y.o. -presents for follow-up to the ILD clinic.  She is known to have rheumatoid arthritis with ILD changes.  She had been followed by Dr. Christinia Gully.  However in July 2019 in September 2019 she has had 2 admissions to the hospital with respiratory distress and hypoxemic respiratory failure.  In the first 1 that seem to be right lower lobe infiltrate and then she improved from it but in the second 1 even though the right lower lobe infiltrates were better she still was hypoxemic.  Acid reflux and dysphagia was considered a possible etiology but she passed swallow study 2 times.  They thought she had some reflux.  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  At the same time it is not clear if the ILD is progressive based on pulmonary function testing below   At this point in time she tells me that she is getting home physical therapy.  Her fatigue is improving but it is not fully resolved.  She was discharged on continuous oxygen which she is using.  However she is feeling less short of breath.  Today in fact when we turned her oxygen off and walked her she did not desaturate and this is a significant improvement.  She is on monthly Orencia through the [facility] rheumatologist Dr. Eda Paschal.  At this point in time she is put the Orencia on hold.  She told me that she is been on Orencia for 4 years and never had a respiratory exacerbation still recently x 2.  Although before going on Orencia she had pneumonia while on Remicade and the Remicade.  In terms of her rheumatoid arthritis she hardly has any pain.  Her joint architecture is fairly well-preserved because of various immunomodulators over  time.  She says that she was on Remicade for years and when she stopped it for 8 weeks before the switch to [place] she never really had a relapse in her rheumatoid arthritis.  She is largely pain and stiffness free.  She believes she can go without  her Orencia for a while.  Review of the literature shows greater than 10% chance of a respiratory infection especially COPD exacerbation.  Although the time frame for this is unclear.       OV 06/07/2018  Subjective:  Patient ID: Stacey [name], female , DOB: 17-Mar-1941 , age 17 y.o. , MRN: [id number] , ADDRESS: [place] [address]   06/07/2018 -   Chief Complaint  Patient presents with   Follow-up    ILD, PFT done today, some wheezing and coughing but better tha before   Rheumatoid arthritis ILD and asthma/obstructive lung disease phenotype on Dulera  HPI VENISSA NAPPI 80 y.o. -presents for routine follow-up with her husband.  She is here to follow-up with [facility] Dr. Stann Mainland.  She plans to do  this in December 2019.  She continues to be off Orencia.  Her joints are slowly getting stiff again.  She believes that she will need to be back on immunosuppression agent again.  She currently continues Medrol 4 mg alternating with 2 mg.  This for her rheumatoid arthritis.  In terms of her joints she continues on Medrol 4 mg alternating with 2 mg but not on any other immunosuppression agent.  Overall she is been stable but for the last 2 weeks has had green sputum and wheezing and chest congestion and cough.  She recently visited her husband who was hospitalized and walking the long hallways at [facility] made a short of breath but she thinks this is probably baseline for her.  There are no other new issues.  She did have spirometry and DLCO and this shows a decline compared to September 2019 and a similar to July 2019.  It is documented below.  This is probably reflective of a flareup   OV  07/21/2018  Subjective:  Patient ID: Stacey [name], female , DOB: [birth year] , age 89 y.o. , MRN: [id number] , ADDRESS: [place] [address]   07/21/2018 -   Chief Complaint  Patient presents with   Follow-up    Pt states she has been doing well since last visit. States she is about to begin Rituxan with [facility]. Pt still becomes SOB with exertion. Denies any complaints of cough or CP.   Rheumatoid arthritis ILD and asthma/obstructive lung disease phenotype on Dulera  HPI SUMER MOOREHOUSE 80 y.o. -presents for follow-up of the above.  Last seen just before Thanksgiving 2019.  In the interim overall stable although on June 23, 2018 she climbs a steep flight of stairs which is unusual exertion for her and she became very dyspneic.  Following day saw Dr. Stann Mainland at [facility] rheumatology and was given Z-Pak and prednisone and started feeling better.  Although it is not fully clear to me she had fever and bronchitic symptoms.  I reviewed Dr. Stann Mainland note.  Dr. Stann Mainland is decided to start Rituxan for rheumatoid arthritis.  She is only having some minimal joint pain at this point.  She is off Orencia and continues to be off [place].  She did have some blood work with [place] before starting Rituxan.  She is due to see Dr. Stann Mainland within the next week and start her Rituxan.  Her liver function test July 18, 2017 is normal hemoglobin is normal.  CRP is also normal.  We did spirometry and walking desaturation test.  These show improvement compared to before and these are documented below.  Currently she not using nighttime or daytime oxygen.  She is wondering if she could switch rheumatology care to [facility].  This is because while she likes [employer] she is getting older and more frail and feels some body local would be of help.  I have sent a message to Dr. Estanislado Pandy inquiring.  Certainly we can help her with Rituxan infusions at [place] system if needed.  She  will check on this with her [facility] rheumatologist.       OV 03/27/2019  Subjective:  Patient ID: Stacey [name], female , DOB: [birth year] , age 24 y.o. , MRN: [id number] , ADDRESS: [place] [address]  Rheumatoid arthritis ILD and asthma/obstructive lung disease phenotype on [facility]   03/27/2019 -  Rourine fu   HPI JAMISON YUHASZ 80 y.o. -  presents for the above.  Last seen in January 2020.  After that she has seen Dr. Stann Mainland rheumatology at [facility].  She is now getting Rituxan 2 doses every 6 months.  She says this is helped her joints and her stiffness.  She is a little bit more mobile than usual.  However in terms of her respiratory status she continues to have episodic cough.  In June 2020 she again got hypoxemic and got admitted.  Since then she has had episodic cough.  She had a respiratory exacerbation in June 2020 for the admission she got steroids.  This seemed to help.  She is also on a higher dose Dulera right now.  In terms of her cough this seems to be her biggest problem.  She seems to be on Dulera, Singulair scheduled with also Tessalon and Delsym and DuoNeb.  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  Her last high-resolution CT scan of the chest was 1 year ago.  She says the dyspnea itself is not worse.  She is currently not using oxygen.  On exam she did have some wheezing.  Currently she has white and brown sputum but this is baseline.  In terms of a COVID wrist she has been tested recently couple of times and this is been negative.  She is isolating well.  She wanted to know COVID prevention activities and risk status and masking strategies.      OV 05/24/2019  Subjective:  Patient ID: Stacey [name], female , DOB: 1941-02-13 , age 23 y.o. , MRN: [id number] , ADDRESS: [place] [address]   05/24/2019 -   Chief  Complaint  Patient presents with   Follow-up    Pt was recently in the hosp due to ILD. Pt states that she has been better since being out of the hosp.   Rheumatoid arthritis ILD and asthma/obstructive lung disease phenotype on Dulera/Singulair  Post herpetic neuralgia - on elavil, gabapentin, effexor  HPI NANDANA KROLIKOWSKI 80 y.o. -returns for follow-up.  At the last visit approximately 2 months ago she was reporting worsening cough following an admission in summer 2020.  Her pulmonary function test suggested worsening ILD status.  Therefore we requested a high-resolution CT chest which she did in October 2020.  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  However in the interim after the CT scan was done towards the end of October 2020 she developed worsening of her cough over 2 weeks and also associated shortness of breath but significantly the cough is much worse.  She ended up getting admitted to the hospital.  There was some hypoxemia.  By this time she had finished a ENT evaluation that did not show any involvement of the arytenoids.  Pulmonary was consulted.  She was given a prednisone burst which she just finished I believe yesterday.  She is back on her baseline Medrol.  And she is feeling better.  Her oxygen status is improved although she is using oxygen at night now.  She is really frustrated with these recurrent flareups and these admissions which ended up with her having a wheeze and also hypoxemia.  Currently she is on her baseline Medrol for rheumatoid arthritis associated with Dulera and Singulair.  She is also on losartan for blood pressure.  Her walking desaturation test is slightly worse  than baseline.  She has a GI consult pending because of the recurrent episodes of cough and flareups.  We went over exposure history.  We used interstitial lung disease questionnaire for the exposure history.  Specifically she denies any electronic cigarette use of  marijuana use of cocaine use or any IV drug abuse.  [place] the last 14 years.  Asked extensive questions about the home environment it is positive for nebulizer use but the nebulizer does not have mildew or mold in it.  Otherwise no organic antigen exposure.  The house is not damp.  There is no mold or mildew in the shower curtain.  There is no humidifier use no steam iron use.  No Jacuzzi use.  No misting [place] outside to inside the house.  No pet birds.  No pet gerbils no feather pillows.  There is no mold in the [facility] duct.  She does not do any gardening.  Does not use wind instruments.  Also 122 question occupational history elicited and essentially negative.  The other issue is that she has polypharmacy.  She is asking for my help in reducing her medications.  She is on 3 medications for postherpetic neuralgia.  She is on losartan      OV 06/21/2019  Subjective:  Patient ID: Stacey [name], female , DOB: 1940-07-27 , age 83 y.o. , MRN: [id number] , ADDRESS: [place] [address]   06/21/2019 -   Chief Complaint  Patient presents with   televisit    hosp 11/29-12/4 due to covid with pna. pt said that she is doing okay after recent hosp but states she has no energy.      AUGUSTA MIRKIN 80 y.o. - last visit 05/24/2019. Diagnosed with covid-19 on 05/31/2019. Admitted 06/11/2019  - 06/16/2019. Quarantine ends 06/21/2019 today. Rx with Remdesviri and Decadron. Husband also on phone. Questions  1. Quarantine ends - from 06/22/19_0 and she is not contagious and likely resistant to reinfection to covid for another few months  2. She is on 10 day dexamethasone for covid - today is last day for it  3. She is on dulera. Hospital gave combivent and she does not want do combivent - this is fine  4. Ofev for ILD - not started it yet . She had questions about side effects. Explained GI and LFT monirtong. SHe wanted to  wait till Jan 2021 and start it . I am ok with that.   5. Small hiat   IMPRESSION: HRCT OCt 2020 1. 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 Findings have progressed since 2017 and 2019 high-resolution chest CT studies. 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 Findings are consistent with UIP per consensus guidelines: Diagnosis of Idiopathic Pulmonary Fibrosis: An Official ATS/ERS/JRS/ALAT Clinical Practice Guideline. [place], Iss 5, [phone], Mar 13 2017. 2. 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 4. Small hiatal hernia.   Aortic Atherosclerosis (ICD10-I70.0).     Electronically Signed   By: Ilona Sorrel M.D.   On: 04/24/2019 13:29    OV 08/03/2019 - face to face visit  Subjective:  Patient ID: Stacey [name], female , DOB: [birth year] , age 54 y.o. , MRN: [id number] , ADDRESS: [place] [address]    Rheumatoid arthritis with Joint  s/ILD- Rituxan/steroids ([facility])  - progressive phenotype (CT 2017/2019 -> Oct 2020). Planned Ofev start in Nov 2020 but did not start as of dec 2020. LFT normal Jan 2021  [place]/obstructive lung disease phenotype on Dulera/Singulair  Post herpetic neuralgia - on elavil, gabapentin, effexor  Coronary Artery Calcification - 1 vessel - cardiology referral done  Diagnosed with covid-19 on 05/31/2019. Admitted 06/11/2019  - 06/16/2019. Rx with Remdesviri and Decadron.  Small Hiatal hernia onCT Oct 2020  Rib fracture from fall Dec 2020: Musculoskeletal: There is nondisplaced fractures of the posterior left fifth, 6, 8, 9, and tenth left ribs. There is unchanged slight superior compression deformities of the T5, T6, T10 vertebral bodies with less than 25% loss in height.Associated pulmnary contusion  +    HPI JENESA FORESTA 80 y.o. -presents for a visit. Her nintedanib has been delayed because of COVID-19. Also subsequently a fall. She saw a nurse practitioner on July 20, 2019 and they took a shared decision to start nintedanib. She had a ? telephone visit with Dr. Candie Mile the rheumatologist at [facility]. Patient scheduled for her next Rituxan in February 2021 but reviewed the notes indicates this could be pushed to early spring 2021. Dr. Stann Mainland is okay with the patient study got intubated at this point in time.  Patient has follow-up with Dr. Stann Mainland tomorrow at [facility].  At this face-to-face visit she is here with her husband.  She says she is much better after the Covid and also the fall that fractured her ribs.  Nevertheless all this is left her fatigue.  Infective fatigue scores are much worse.  Also in the last 2 weeks has had increased cough wheezing and shortness of breath.  The sputum was also changed color in the last 1 week to clear green.  This is all new.  Her symptom score is therefore a worse than her baseline.  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  She is only taking 100 mg once a day.  The plan was to go up to 100 mg twice a day next week.  This is a minimum effective dose.  The maximum dose is 150 mg twice a day.  Given her age and comorbidities we are starting at a low dose.  Part of this visit is to make sure that she is tolerating the drug fine.  And so far she is.  She is interested in the ILD-pro registry study done by the [facility].   She is working with physical therapy for her fatigue.  Ambulatory Walk 07/20/2019 2 Lap- O2 92% RA; HR 109 No shortness of breath She did not need to stop Used walker    OV 09/20/2019  Subjective:  Patient ID: Stacey [name], female , DOB: Jun 20, 1941 , age 74 y.o. , MRN: [id number] , ADDRESS: [place] [id number]   09/20/2019 -   Chief Complaint  Patient presents with   Televisit    Called and spoke with pt who stated she has been feeling okay since last visit. Pt stated she is still taking OFEV and denies any complaints. Pt states she believes her breathing is stable at this point.    Rheumatoid arthritis with Joint s/ILD- Rituxan/steroids (duke uinviesty)  - progressive phenotype (CT 2017/2019 -> Oct 2020). Planned Ofev start in Nov 2020 but did not start as of dec 2020.  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 Admitted 06/11/2019  - 06/16/2019. Rx with Remdesviri and Decadron.  Small Hiatal hernia on CT Oct 2020  Rib fracture from fall Dec 2020: Musculoskeletal: There is nondisplaced fractures of the posterior left fifth, 6, 8, 9, and tenth left ribs. There is unchanged slight superior compression deformities of the T5, T6, T10 vertebral bodies with less than 25% loss in height.Associated pulmnary contusion +   HPI MaLAIBA [id number].o. - has 2nd covid vaccine later this week. Rituxan is end of the month. Baseline RA regiment is On dulera for associated obstructio of lung. Continue ofev 10065mid since mid jan 2021 for ILD. No side effects. Did see BetDerl Barrowr face to face visit early feb 2021 -> for bronchitis and felt better after prdnisone and zpak. Currently on medrol   8 mg day and staying there per BetDerl BarrowWants t oknow if she can taper. Wants to know how she can prevent care flare ups. Explained ofev, masking and social distancing  prevent respiratory infection.Denies choking on food or aspirating. Denies mold in house - relatively news. Denies dog. Denies cat. Denies carious teeth. Denies post nasal drip  OVerll feels better and improved dyspnea.     OV 12/18/2019  Subjective:  Patient ID: MarTamala Fothergillemale , DOB: 6/203-08-1942age 14 26o. , MRN: [id number]: [id number] RusShon BatonD Rheumatologist-Dr. JenEda Paschal [facility]: Dr. RamVeryl Speak/01/2020 -   Chief Complaint  Patient presents with   Follow-up    no worse   Rheumatoid arthritis with Joint s/ILD- Rituxan/steroids (duke uinviesty)  - progressive phenotype (CT 2017/2019 -> Oct 2020). Planned Ofev start in Nov 2020 but did not start as of dec 2020. 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 Admitted 06/11/2019  - 06/16/2019. Rx with Remdesviri and Decadron.  Small Hiatal hernia on CT Oct 2020  Rib fracture from fall Dec 2020: Musculoskeletal: There is nondisplaced fractures of the posterior left fifth, 6, 8, 9, and tenth left ribs. There is unchanged slight superior compression deformities of the T5, T6, T10 vertebral bodies with less than 25% loss in height.Associated pulmnary contusion +  -On Reclast once a year through Dr. JennifEda Paschal MargarTamala Fothergillo20-returns for follow-up of her ILD.  It has been a few month since I last saw her.  In this time she did not taper her Medrol down.   She stated 8 mg/day.  She was originally taking the Medrol for her rheumatoid arthritis for many years.  She recently increased the dose from 4 mg / 2 mg every other day to 8 mg daily because of recurrent respiratory exacerbations.  She is asking for [facility] refills for obstructive lung disease.  She is getting Rituxan through Dr. Eda Paschal for rheumatoid arthritis.  Her next Rituxan is in September 2021.  She saw Dr. Stann Mainland in April 2021 and reviewed the note.  Her liver function test at the time was fine.  In terms of her ILD she continues on nintedanib 100 mg twice daily.  She says she has no side effects from the drug she is tolerating it quite well.  We discussed about increasing the dose but she wants to hold off because she just got a new supply.  However she is open to increasing the dose when the supply runs out.  She is due for liver function test today.  In terms of overall symptoms things have improved as can be seen below and the symptom score.Marland Kitchen  However her main concern is that of fatigue.  She is open to attending pulmonary rehabilitation.  She is not using oxygen at home.  Sometime back when we tested overnight oxygen desaturation test she did not desaturate.  Walking desaturation test today stable.  She does notice of note that her blood pressure has gone up.  She is working with her primary care physician on this.  She is on losartan.    OV 04/10/2020   Subjective:  Patient ID: Stacey [name], female , DOB: 02/28/1941, age 20 y.o. years. , MRN: [id number],  ADDRESS: [address] [place] [address] PCP  Shon Baton, MD Providers : Treatment Team:  Attending Provider: Brand Males, MD   Chief Complaint  Patient presents with   Follow-up    CT scan 8/23, has not increased Ofev, shortness of breath with exertion.    Rheumatoid arthritis with Joint s/ILD- Rituxan/steroids (duke uinviesty)  - progressive phenotype (CT 2017/2019 -> Oct 2020). Planned Ofev  start in Nov 2020 but did not start as of dec 2020. 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 Admitted 06/11/2019  - 06/16/2019. Rx with Remdesviri and Decadron.  Admitted August 2021 for respiratory infection not otherwise specified.  [place]  Small Hiatal hernia on CT Oct 2020  Rib fracture from fall Dec 2020: Musculoskeletal: There is nondisplaced fractures of the posterior left fifth, 6, 8, 9, and tenth left ribs. There is unchanged slight superior compression deformities of the T5, T6, T10 vertebral bodies with less than 25% loss in height.Associated pulmnary contusion +  -On Reclast once a year through Dr. JenEda Paschal HPI MarTamala [name] 20o. -returns for follow-up.  Last week by myself early June 2021.  Then after that in early August 2020 when she got admitted for respiratory infection not otherwise specified.  She was hypoxemic  on room air.  This followed a trip to [place].  In the follow-up phase end of August 2020 when she had high-resolution CT chest that showed increased groundglass opacities but when compared to a CT from 8 months ago.  Also chronic ILD had worsened.  I personally visualized the image at this point in time she is improved from this admission she is not using oxygen at all.  In fact walking desaturation test shows near baseline.  Nevertheless overall her symptom scores have declined over time ILD symptom score is listed  below.  She is really concerned about several issues.  In terms of her ILD she was inquiring about escalating to 150 mg twice daily.  Even at 100 mg twice daily she is significantly symptomatic in terms of nonrespiratory issues such as nausea and dizziness and fatigue.  I did caution her that these could all get worse.  Therefore she is just opted to be at 100 mg twice daily  -Recurrent admissions for respiratory issues [I did address that some of this was Covid and fall related and not necessarily her usual summer exacerbation].  Nevertheless, I asked about exposures at home.  She does not have a feather blanket or feather pillow or feather jacket.  She denies any mold or bird exposure or mildew exposure at home.  Does not do any gardening or wind instruments.  She is immunosuppressed and I did notice that she is not on Bactrim for PCP prophylaxis.  In 2019 when we checked G6PD the lab could not process this lab.  She is interested in Bactrim prophylaxis.  She is interested in okay getting G6PD rechecked   -She is dealing with significant amount of dizziness.  Neurology is sending her to neuro rehab.  It is believed it is multifactorial.  I reviewed her recent CT head angio report.  I personally visualized the image   -She is also worried about cough.  Currently cough is tolerable but she says prior to her exacerbations cough gets worse.  We did discuss with her that if she is aspirating which she denied.  We did discuss about the possibility of starting Bactrim and monitoring her cough and she is fine with that.  I did review old records Lungs/Pleura: Biapical pleuroparenchymal scarring. Patchy and largely peripheral peribronchovascular ground-glass, increased from 07/03/2019. Underlying subpleural reticulation, traction bronchiectasis/bronchiolectasis and scattered honeycombing, similar to minimally increased from 07/03/2019. Calcified granulomas. No pleural fluid. Airway is unremarkable. No air  trapping.   Upper Abdomen: Visualized portions of the liver, adrenal glands, kidneys, spleen, pancreas, stomach and bowel are unremarkable with the exception of a small hiatal hernia. Cholecystectomy. Calcified upper abdominal lymph nodes.   Musculoskeletal: Degenerative changes in the spine. No worrisome lytic or sclerotic lesions.   IMPRESSION: 1. Increased patchy pulmonary parenchymal ground-glass may be due to an atypical/viral pneumonia, including due to COVID-19. 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 2. Aortic atherosclerosis (ICD10-I70.0). Coronary artery calcification.     Electronically Signed   By: Lorin Picket M.D.   On: 03/04/2020 12:58    OV 08/01/2020  Subjective:  Patient ID: Stacey [name], female , DOB: 1941-03-07 , age 80 y.o. , MRN: [id number] , ADDRESS: [address] [place] [address] PCP Shon Baton, MD Patient Care Team: Shon Baton, MD as PCP - General (Internal Medicine) Buford Dresser, MD as PCP - Cardiology (  Cardiology)  This Provider for this visit: Treatment Team:  Attending Provider: Brand Males, MD   Rheumatoid arthritis with Joint s/ILD- Rituxan/steroids (duke uinviesty)  - progressive phenotype (CT 2017/2019 -> Oct 2020). Planned Ofev start in Nov 2020 but did not start as of dec 2020. 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 Admitted 06/11/2019  - 06/16/2019. Rx with Remdesviri and Decadron.  Admitted August 2021 for respiratory infection not otherwise specified.  [place]  Small Hiatal hernia on CT Oct 2020  Rib fracture from fall Dec 2020: Musculoskeletal: There is nondisplaced fractures of the posterior left fifth, 6, 8, 9, and tenth left ribs. There is unchanged slight superior compression deformities of the T5, T6, T10 vertebral bodies with less than 25% loss in height.Associated pulmnary contusion +  -On Reclast once a year through Dr. JenEda Paschal 08/01/2020 -   Chief Complaint  Patient presents with   Follow-up    Doing well     HPI MarRHEALYNN MYHRE 12o. -returns for follow-up.  She says since last visit she has lost weight.  In fact tracking her weight it appears she is definitely lost weight.  She has occasional intermittent nausea once every few weeks this is because she does not time her nintedanib well with food.  She has a low appetite as well.  She tried to go up on the Internet but she is unable to.  She takes a low-dose 100 mg twice daily.  She is on Rituxan and prednisone.  She is up-to-date with her COVID-vaccine.  Current shortness of breath standpoint she is stable.  Symptoms are stable.  CT Chest data  OV 10/22/2020  Subjective:  Patient ID: MarTamala Fothergillemale , DOB: 6/2Jul 09, 1942age 28 67o. , MRN: [id number]: [address] [id number] RusShon BatonD Patient Care Team: RusShon BatonD as PCP - General (Internal Medicine) ChrBuford DresserD as PCP - Cardiology (Cardiology)  This Provider for this visit: Treatment Team:  Attending Provider: RamBrand MalesD    10/22/2020 -   Chief Complaint  Patient presents with   Follow-up    Still having shortness of breath with activity, denies  increase in severity.     Rheumatoid arthritis with Joint s/ILD- Rituxan/steroids (duke uinviesty)  - progressive phenotype (CT 2017/2019 -> Oct 2020). Planned Ofev start in Nov 2020 but did not start as of dec 2020. 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 Admitted 06/11/2019  - 06/16/2019. Rx with Remdesviri and Decadron.  - sp evushed x 2  Admitted August 2021 for respiratory infection not otherwise specified.  Following trip to [place].  Was hypoxemic.  Small Hiatal hernia on CT Oct 2020  Rib fracture from fall Dec 2020: Musculoskeletal: There is nondisplaced fractures of the posterior left fifth, 6, 8, 9, and tenth left ribs. There is unchanged slight superior compression deformities of the T5, T6, T10 vertebral bodies with less than 25% loss in height.Associated pulmnary contusion +  -On Reclast once a year through Dr. Eda Paschal  Normal ECHO Summer 2020   HPI ARTHUR AYDELOTTE 80 y.o. -returns for follow-up.  At this point in time for her ILD she is taking nintedanib 100 mg twice daily.  Last liver function test was in December 2021.  She is now attending pulmonary rehabilitation.  She is not sure it is helping her.  She tells me that she does not desaturate at pulmonary rehabilitation so she not using her oxygen.  Review of the records indicate same but it appears that today her dyspnea is worse in terms of symptom score although she is denying that.  Walking desaturation  test in the office today with a forehead probe suggest that pulse ox is declined.  Her pulmonary function test also shows 5% FVC decline.  She was surprised by this.  In talking to her realize that for coexistent obstructive lung disease she is no longer taking Dulera.  She is willing to take inhaler which she will have to twist instead of having to pump with her fingers because she has rheumatoid arthritis.  She is no longer losing weight though.  She had questions about taking monoclonal antibody prophylaxis.  She is under the impression she needs to take it every 6 months with Rituxan dosing.  I did explain to her that in the presence of Rituxan the body is not able to respond actively to vaccine and that is why she is having prophylactic antibody.  She is now had 2 doses the last 1 being a few weeks ago first 1 was in January 2022.  I have written to the [facility] coordinator Wilber Bihari 27 monoclonal antibody prophylaxis schedule.  Expressed to patient that because of monoclonal antibody prophylaxis she did not do the vaccine.  Last echocardiogram was in summer 2020.      OV 12/23/2020  Subjective:  Patient ID: Stacey [name], female , DOB: 09-02-1940 , age 50 y.o. , MRN: [id number] , ADDRESS: [place] [id number] PCP Shon Baton, MD Patient Care Team: Shon Baton, MD as PCP - General (Internal Medicine) Buford Dresser, MD as PCP - Cardiology (Cardiology)  This Provider for this visit: Treatment Team:  Attending Provider: Brand Males, MD   12/23/2020 -   Chief Complaint  Patient presents with   Follow-up    PFT performed today. Pt states she has been doing okay since last visit and denies any complaints.     HPI LATICA HOHMANN 80 y.o. -returns for follow-up.  There is concern for progression.  Last visit we have made sure she added Breo.  She says she feels better.  In fact symptom score is better.  However pulmonary function test shows  continued decline.  She is on the low-dose nintedanib.  She says if she does not take the nintedanib exactly with food and  even if she takes it 15 to 20 minutes later she will have nausea.  Today she had nausea but otherwise if she is diligent she can maintain.  I informed her the pulmonary function test shows continued decline.  She is on Rituxan prednisone and low-dose nintedanib.  She initially said she will not be able to tolerate the higher dose nintedanib but after realizing that her nausea is mild and her weight loss is stable she says she is willing to give the higher dose nintedanib and attend.  I was able to get her some donor sample of nintedanib.  The lot number of this is [id number].  Expiration date is November 2023.  The serial number is [id number].  The GT IN number is [id number]       ECHO 11/3120  IMPRESSIONS     1. Left ventricular ejection fraction, by estimation, is 65 to 70%. The  left ventricle has normal function. The left ventricle has no regional  wall motion abnormalities. Left ventricular diastolic parameters are  consistent with Grade I diastolic  dysfunction (impaired relaxation). Elevated left ventricular end-diastolic  pressure. The E/e' is 14. The average left ventricular global longitudinal  strain is -18.4 %.   2. Right ventricular systolic function is normal. The right ventricular  size is normal. There is mildly elevated pulmonary artery systolic  pressure. The estimated right ventricular systolic pressure is 29.9 mmHg.   3. The mitral valve is abnormal. Mild mitral valve regurgitation.   4. The aortic valve is tricuspid. Aortic valve regurgitation is not  visualized. Mild aortic valve sclerosis is present, with no evidence of  aortic valve stenosis.   5. The inferior vena cava is normal in size with greater than 50%  respiratory variability, suggesting right atrial pressure of 3 mmHg.   Comparison(s): Changes from prior study are noted. 12/18/18  EF 60-65%. PA  pressure 26mHg.    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  OV 03/10/2021  Subjective:  Patient ID: MTamala [name] female , DOB: [birth year], age 80y.o. , MRN: [id number], [place] [address] [id number] RShon Baton MD Patient Care Team: RShon Baton MD as PCP - General (Internal Medicine) CBuford Dresser MD as PCP - Cardiology (Cardiology)  This Provider for this visit: Treatment Team:  Attending Provider: RBrand Males MD    03/10/2021 -   Chief Complaint  Patient presents with   Follow-up    Pt states she has questions about ofev she currently on it but having side effects.    Rheumatoid arthritis with Joint s/ILD- Rituxan/steroids (duke uinviesty)  - progressive phenotype (CT 2017/2019 -> Oct 2020). Planned Ofev start in Nov 2020 but did not start as of dec 2020. 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 Admitted 06/11/2019  - 06/16/2019. Rx with Remdesviri and Decadron.  - sp evushed x 2  Admitted August 2021 for respiratory infection not otherwise specified.   Following trip to [place].  Was hypoxemic.  Small Hiatal hernia on CT Oct 2020  Rib fracture from fall Dec 2020: Musculoskeletal: There is nondisplaced fractures of the posterior left fifth, 6, 8, 9, and tenth left ribs. There is unchanged slight superior compression deformities of the T5, T6, T10 vertebral bodies with less than 25% loss in height.Associated pulmnary contusion +  -On Reclast once a year through Dr. 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  -returns for follow-up.  She presents with her husband Festus Aloe.  Last visit she wanted to increase the nintedanib to 150 mg twice daily but this is made her have more nausea and vomiting.  The diarrhea is very mild.  She then reduced her nintedanib 200 mg twice daily but still having side effects.  She is now started losing weight again.  She is really frustrated by this.  Her husband asked about pirfenidone.  Explained that the research with progressive pulmonary fibrosis is limited in the case of pirfenidone so nintedanib is the first choice.  Did agree that if nintedanib did not work we would try pirfenidone.  She wanted know what her options were.  We discussed about stopping nintedanib and improving quality of life but face the risk of continued progression pulmonary fibrosis.  She was not that enthused about this choice.  We discussed about trying Zofran with the nintedanib.  She seemed more receptive to this idea.  We decided that we will try with 100 mg twice daily at the lower dose and then see.  She believes asthma is under control.  Her pulmonary function test shown below seems to fluctuate.  Overall symptom severity stable.  She wanted a handicap placard signed today which I did.         SYMPTOM SCALE - ILD 03/27/2019  05/24/2019 (2-3 weeks pre cpovid)  08/03/2019 Post covid Post fall rib # 12/18/2019  04/10/2020 142# resp virus admit aug 2021 nos  08/01/2020 134#  10/22/2020 136# Now doing rehab 12/23/2020 136# 03/10/2021 131#  O2 use ra       ra ra  Shortness of Breath 0 -> 5 scale with 5 being worst (score 6 If unable to do)          At rest 0 _0 0 3 0 3  Simple tasks - showers, clothes change, eating, shaving 0 _1 Household (dishes, doing bed, laundry) _2 Shopping _3 Walking level at own pace _4 Walking up Stairs _5 Total (40 - 48) Dyspnea Score _6 How bad is your cough? _7 0 0 0 1  How bad is your fatigue 3 3.5 5 3._8 nause    0 1 1 0 1 2.5  vomit    0 1 1 0 1 2.4  diarrhea  0 1 0 0 0 1  anxiety    0 2 0 1 0 1  depression    0 1 0 0 0 0     Simple office walk  feet x  3 laps goal with forehead probe 04/12/2018  07/21/2018  05/24/2019  12/18/2019  04/10/2020  08/01/2020  10/22/2020  03/10/2021   O2 used Room air - off o2 x 10 min Room air  Room air _0   Number laps completed 3 x 185 feet 3 x 250 feet    r    Comments about pace normal normal   Slow, no walker Slow . No walker.  Slow. No walker. No cane walker  Resting Pulse Ox/HR 99% and 72/min 97% and 74/min 97% and 81/min 97% and 76/m 96% and 88/min 94% and 83 96% and HR 79 97% and HR 51  Final Pulse Ox/HR 98% and 95/min 96% and 96/min 94% and 107/min 93% and 94 92% and 97 90% and 100 89% and 104/min 92% and 106  Desaturated </= 88% _1  yes yes no  Desaturated <= 3% points no no Yes, 3 pints Ues, 4 points Yes,  4 points Yes, 4 points Yes, 7 points   Got Tachycardic >/= 90/min _2  yes yes   Symptoms at end of test No complaint Mild dyspnea Mod dyspnea Some dyspnea dyspnea dyspnie cat 3d Dyspneic moderate  Dyspneic and wheezing  Miscellaneous comments improed from hospital Same v improved woprse same  Wobbly gait baseline Needed 2 break       PFT  PFT Results Latest Ref Rng & Units 03/07/2021 12/23/2020  10/22/2020 09/10/2020 04/08/2020 12/12/2019 03/27/2019  FVC-Pre L 1.78 1.66 1.73 1.76 1.83 1.90 1.79  FVC-Predicted Pre % 66 60 63 64 66 68 64  FVC-Post L - - - - - - -  FVC-Predicted Post % - - - - - - -  Pre FEV1/FVC % % 72 71 76 75 74 78 79  Post FEV1/FCV % % - - - - - - -  FEV1-Pre L 1.28 1.17 1.30 1.32 1.35 1.48 1.42  FEV1-Predicted Pre % 64 57 64 64 66 71 68  FEV1-Post L - - - - - - -  DLCO uncorrected ml/min/mmHg 12.94 14.98 13.67 15.38 14.39 13.91 15.04  DLCO UNC% % 66 77 70 79 74 71 77  DLCO corrected ml/min/mmHg 12.94 14.98 13.80 15.38 15.49 13.91 -  DLCO COR %Predicted % 66 77 71 79 79 71 -  DLVA Predicted % 84 86 80 102 108 86 96  TLC L - - - - - - -  TLC % Predicted % - - - - - - -  RV % Predicted % - - - - - - -       has a past medical history of Abnormal finding of blood chemistry, Asthma, H/O measles, H/O varicella, Hypertension, 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[place]), 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   reports that she has never smoked. She has never used smokeless tobacco.  Past Surgical History:  Procedure Laterality Date   CHOLECYSTECTOMY  2011   IR ANGIO INTRA EXTRACRAN SEL COM CAROTID INNOMINATE BILAT MOD SED  06/18/2020   IR ANGIO VERTEBRAL SEL SUBCLAVIAN INNOMINATE UNI R MOD SED  06/18/2020   IR ANGIO VERTEBRAL SEL VERTEBRAL UNI L MOD SED  06/18/2020  WISDOM TOOTH EXTRACTION      Allergies  Allergen Reactions   Penicillins Other (See Comments)    Reaction unknown occurred during childhood Has patient had a PCN reaction causing immediate rash, facial/tongue/throat swelling, SOB or lightheadedness with hypotension: Unknown Has patient had a PCN reaction causing severe rash involving mucus membranes or skin necrosis: Unknown Has patient had a PCN reaction that required hospitalization: No Has  patient had a PCN reaction occurring within the last 10 years: No If all of the above answers are "NO", then may proceed with Cephalosporin use.  Unsur   Remicade [Infliximab] Other (See Comments)    Reaction unknown   Sulfa Antibiotics Other (See Comments)    Reaction unknown    Immunization History  Administered Date(s) Administered   Fluad Quad(high Dose 65+) 04/23/2020   Influenza Split 04/12/2013, 04/12/2014, 03/14/2015, 04/23/2016   Influenza, High Dose Seasonal PF 04/12/2017, 03/24/2018, 04/13/2019   Influenza-Unspecified 04/03/2013, 04/22/2017   Moderna Sars-Covid-2 Vaccination 08/25/2019, 09/22/2019, 03/07/2020   Pneumococcal Conjugate-13 04/03/2013, 09/28/2014   Pneumococcal Polysaccharide-23 07/13/2012, 07/24/2013   Td 01/11/2018   Tdap 12/10/2017    Family History  Problem Relation Age of Onset   Asthma Mother    Anemia Mother    Polymyalgia rheumatica Mother    COPD Father    Pulmonary fibrosis Father    Breast cancer Maternal Grandmother 88     Current Outpatient Medications:    amitriptyline (ELAVIL) 25 MG tablet, Take 25 mg by mouth at bedtime., Disp: , Rfl: 0   benzonatate (TESSALON) 100 MG capsule, Take 100 mg by mouth as needed for cough., Disp: , Rfl:    beta carotene w/minerals (OCUVITE) tablet, Take 1 tablet by mouth daily., Disp: , Rfl:    Biotin 1000 MCG tablet, Take 1,000 mcg by mouth daily., Disp: , Rfl:    cholecalciferol (VITAMIN D) 1000 UNITS tablet, Take 1,000 Units by mouth daily., Disp: , Rfl:    Estradiol 10 MCG TABS vaginal tablet, Place 10 mcg vaginally daily as needed (dryness). , Disp: , Rfl:    fluticasone furoate-vilanterol (BREO ELLIPTA) 100-25 MCG/INH AEPB, Inhale 1 puff into the lungs daily., Disp: 60 each, Rfl: 5   gabapentin (NEURONTIN) 300 MG capsule, Take 600 mg by mouth at bedtime., Disp: , Rfl:    lactulose (CEPHULAC) 10 g packet, Take 10 g by mouth daily as needed., Disp: , Rfl:    losartan (COZAAR) 50 MG tablet, Take 1  tablet (50 mg total) by mouth daily., Disp: 30 tablet, Rfl: 1   methylPREDNISolone (MEDROL) 4 MG tablet, Take 1 tablet (4 mg total) by mouth every other day. Alternate with the 94m dose (Patient taking differently: Take 4 mg by mouth daily.), Disp:  , Rfl:    mometasone-formoterol (DULERA) 100-5 MCG/ACT AERO, Inhale 2 puffs into the lungs 2 (two) times daily. (Patient taking differently: Inhale 2 puffs into the lungs 2 (two) times daily. Patient uses as needed.), Disp: 39 g, Rfl: 0   OFEV 100 MG CAPS, TAKE 1 CAPSULE BY MOUTH 2 TIMES DAILY., Disp: 180 capsule, Rfl: 1   ondansetron (ZOFRAN) 4 MG tablet, Take 1 tablet (4 mg total) by mouth every 8 (eight) hours as needed for nausea or vomiting., Disp: 30 tablet, Rfl: 0   pantoprazole (PROTONIX) 40 MG tablet, Take 1 tablet (40 mg total) by mouth 2 (two) times daily before a meal., Disp: 180 tablet, Rfl: 3   polyethylene glycol (MIRALAX / GLYCOLAX) 17 g packet, Take 17 g by mouth  as needed., Disp: , Rfl:    pravastatin (PRAVACHOL) 20 MG tablet, TAKE 1 TABLET IN THE P.M., Disp: 90 tablet, Rfl: 3   riTUXimab (RITUXAN) 100 MG/10ML injection, Inject 100 mg into the vein every 6 (six) months. , Disp: , Rfl:    senna (SENOKOT) 8.6 MG TABS tablet, Take 1 tablet by mouth as needed for mild constipation., Disp: , Rfl:    sulfamethoxazole-trimethoprim (BACTRIM DS) 800-160 MG tablet, Take 1 tablet by mouth 3 (three) times a week. Take on Mon, Wed, Fri (Patient taking differently: Take 1 tablet by mouth every Monday, Wednesday, and Friday.), Disp: 12 tablet, Rfl: 5   venlafaxine XR (EFFEXOR-XR) 75 MG 24 hr capsule, Take 75 mg by mouth at bedtime. , Disp: , Rfl:       Objective:   Vitals:   03/10/21 1440  BP: 124/60  Pulse: 70  Temp: 97.7 F (36.5 C)  TempSrc: Oral  SpO2: 97%  Weight: 131 lb 12.8 oz (59.8 kg)  Height: 5' 6" (1.676 m)    Estimated body mass index is 21.27 kg/m as calculated from the following:   Height as of this encounter: 5' 6" (1.676  m).   Weight as of this encounter: 131 lb 12.8 oz (59.8 kg).  _0 @  Filed Weights   03/10/21 1440  Weight: 131 lb 12.8 oz (59.8 kg)     Physical Exam    General: No distress. Looks well Neuro: Alert and Oriented x 3. GCS 15. Speech normal Psych: Pleasant Resp:  Barrel Chest - no.  Wheeze - nl, Crackles - yes bilaterally extensive with seuqaks, No overt respiratory distress CVS: Normal heart sounds. Murmurs - Ext: Stigmata of Connective Tissue Disease - RA HEENT: Normal upper airway. PEERL +. No post nasal drip        Assessment:       ICD-10-CM   1. Interstitial lung disease due to connective tissue disease ([place])  J84.89    M35.9     2. Asthma, unspecified asthma severity, unspecified whether complicated, unspecified whether persistent  J45.909     3. Drug-induced nausea and vomiting  R11.2    T50.905A     4. Drug-induced weight loss  R63.4    T50.905A          Plan:     Patient Instructions     ICD-10-CM   1. Interstitial lung disease due to connective tissue disease ([place])  J84.89    M35.9     2. Asthma, unspecified asthma severity, unspecified whether complicated, unspecified whether persistent  J45.909     3. Drug-induced nausea and vomiting  R11.2    T50.905A     4. Drug-induced weight loss  R63.4    T50.905A        ASthma stable Interstitial lung disease itself   stable v slightly progressive WEight loss from ofev has resumed along with other GI side efects esp diarrhea   Plan - reduce ofev to 167m twice daily - start zofran 477mtwice daily - take along with ofev - If Ofev does not work out can try [employer] next step or cellcept/immuran -Continue Rituxan and steroids through the rheumatologist - continuie  breo 100 dosing daily once with albuterol as needed  - cotninue singulaire - hold off starting o2 per your wishes at last visit  - hold off discussion on right heart cath till we see how increased ofev tolerance  goes    Follow-up -4 - 6 weeks face to face  visit to [facility] ofev side effects and weigh recovery with zofran   ( Level 05 visit: Estb 40-54 min   in  visit type: on-site physical face to visit  in total care time and counseling or/and coordination of care by this undersigned MD - Dr Brand Males. This includes one or more of the following on this same day 03/10/2021: pre-charting, chart review, note writing, documentation discussion of test results, diagnostic or treatment recommendations, prognosis, risks and benefits of management options, instructions, education, compliance or risk-factor reduction. It excludes time spent by the [place] or office staff in the care of the patient. Actual time 47 min)  SIGNATURE    Dr. Brand Males, M.D., F.C.C.P,  Pulmonary and Critical Care Medicine Staff Physician, [place] Director - Interstitial Lung Disease  Program  Pulmonary [place] [address], [place], [address]  Pager: [phone], If no answer or between  15:00h - 7:00h: call [phone] Telephone: [phone]  4:32 PM 03/10/2021

## 2021-03-10 NOTE — Patient Instructions (Addendum)
ICD-10-CM   1. Interstitial lung disease due to connective tissue disease ([place])  J84.89    M35.9     2. Asthma, unspecified asthma severity, unspecified whether complicated, unspecified whether persistent  J45.909     3. Drug-induced nausea and vomiting  R11.2    T50.905A     4. Drug-induced weight loss  R63.4    T50.905A        ASthma stable Interstitial lung disease itself   stable v slightly progressive WEight loss from ofev has resumed along with other GI side efects esp diarrhea   Plan - reduce ofev to '100mg'$  twice daily - start zofran '4mg'$  twice daily - take along with ofev - If Ofev does not work out can try [employer] as next step or cellcept/immuran -Continue Rituxan and steroids through the rheumatologist - continuie  breo 100 dosing daily once with albuterol as needed  - cotninue singulaire - hold off starting o2 per your wishes at last visit  - hold off discussion on right heart cath till we see how increased ofev tolerance goes    Follow-up -4 - 6 weeks face to face visit to [facility] ofev side effects and weigh recovery with zofran

## 2021-03-18 MED ORDER — OFEV 100 MG PO CAPS: 100.0000 mg | ORAL_CAPSULE | Freq: Two times a day (BID) | ORAL | 1 refills | Status: DC

## 2021-03-18 NOTE — Addendum Note (Signed)
Addended by: Cassandria Anger on: 03/18/2021 04:38 PM   Modules accepted: Orders

## 2021-03-18 NOTE — Progress Notes (Signed)
Refill for Ofev '100mg'$  twice daily sent to [place]. Called patient to notify that she may call later this week to schedule shipment of Ofev to her home.  Phone: [phone]  She will call pharmacy once she has a week left of the supply of Ofev she has at home. States she paid $4000 for medication last time.   Knox Saliva, PharmD, MPH, BCPS Clinical Pharmacist (Rheumatology and Pulmonology)

## 2021-03-27 DIAGNOSIS — E785 Hyperlipidemia, unspecified: Secondary | ICD-10-CM | POA: Diagnosis not present

## 2021-03-27 DIAGNOSIS — D692 Other nonthrombocytopenic purpura: Secondary | ICD-10-CM | POA: Diagnosis not present

## 2021-03-27 DIAGNOSIS — M810 Age-related osteoporosis without current pathological fracture: Secondary | ICD-10-CM | POA: Diagnosis not present

## 2021-03-27 DIAGNOSIS — J849 Interstitial pulmonary disease, unspecified: Secondary | ICD-10-CM | POA: Diagnosis not present

## 2021-03-27 DIAGNOSIS — I771 Stricture of artery: Secondary | ICD-10-CM | POA: Diagnosis not present

## 2021-03-27 DIAGNOSIS — D8989 Other specified disorders involving the immune mechanism, not elsewhere classified: Secondary | ICD-10-CM | POA: Diagnosis not present

## 2021-03-27 DIAGNOSIS — I10 Essential (primary) hypertension: Secondary | ICD-10-CM | POA: Diagnosis not present

## 2021-03-27 DIAGNOSIS — I700 Atherosclerosis of aorta: Secondary | ICD-10-CM | POA: Diagnosis not present

## 2021-03-27 DIAGNOSIS — Z23 Encounter for immunization: Secondary | ICD-10-CM | POA: Diagnosis not present

## 2021-03-27 DIAGNOSIS — J4540 Moderate persistent asthma, uncomplicated: Secondary | ICD-10-CM | POA: Diagnosis not present

## 2021-03-27 DIAGNOSIS — J9611 Chronic respiratory failure with hypoxia: Secondary | ICD-10-CM | POA: Diagnosis not present

## 2021-03-27 DIAGNOSIS — M0510 Rheumatoid lung disease with rheumatoid arthritis of unspecified site: Secondary | ICD-10-CM | POA: Diagnosis not present

## 2021-03-27 DIAGNOSIS — G9389 Other specified disorders of brain: Secondary | ICD-10-CM | POA: Diagnosis not present

## 2021-04-08 ENCOUNTER — Ambulatory Visit (INDEPENDENT_AMBULATORY_CARE_PROVIDER_SITE_OTHER): Payer: Medicare Other | Admitting: Internal Medicine

## 2021-04-08 ENCOUNTER — Other Ambulatory Visit: Payer: Self-pay

## 2021-04-08 ENCOUNTER — Encounter: Payer: Self-pay | Admitting: Internal Medicine

## 2021-04-08 VITALS — BP 120/66 | HR 51 | Temp 97.9°F | Ht 66.0 in | Wt 132.8 lb

## 2021-04-08 DIAGNOSIS — I2510 Atherosclerotic heart disease of native coronary artery without angina pectoris: Secondary | ICD-10-CM

## 2021-04-08 DIAGNOSIS — J8489 Other specified interstitial pulmonary diseases: Secondary | ICD-10-CM | POA: Diagnosis not present

## 2021-04-08 DIAGNOSIS — R112 Nausea with vomiting, unspecified: Secondary | ICD-10-CM

## 2021-04-08 DIAGNOSIS — J45909 Unspecified asthma, uncomplicated: Secondary | ICD-10-CM | POA: Diagnosis not present

## 2021-04-08 DIAGNOSIS — R634 Abnormal weight loss: Secondary | ICD-10-CM | POA: Diagnosis not present

## 2021-04-08 DIAGNOSIS — T50905A Adverse effect of unspecified drugs, medicaments and biological substances, initial encounter: Secondary | ICD-10-CM

## 2021-04-08 DIAGNOSIS — M359 Systemic involvement of connective tissue, unspecified: Secondary | ICD-10-CM | POA: Diagnosis not present

## 2021-04-08 DIAGNOSIS — Z7185 Encounter for immunization safety counseling: Secondary | ICD-10-CM

## 2021-04-08 NOTE — Patient Instructions (Addendum)
   ASthma stable  Interstitial lung disease itself   stable v slightly progressive over time  WEight loss from ofev  full dose resumed  in Aug 2022 along with other GI side efects esp diarrhea but resolved at lower dose in Sept 2022   Plan - continue ofev 100mg  twice daily - take zofran 4mg  twice daily as needed with ofev  - If Ofev does not work out can try [employer] as next step or cellcept/immuran -Continue Rituxan and steroids through the rheumatologist - continuie  breo 100 dosing daily once with albuterol as needed  - cotninue singulaire - hold off starting o2 per your wishes at last visit  - hold off discussion on right heart cath for now - hold off covid mRNA bivalent omicron/BA.5 booster thrugh Jan 2023 because you had 2nd covid June 2022 and are scheduled for evusheld in Oct 9628 at [facility]  - certainly mask and reduce clustering esp during holidays to avoid all respiratory viruses    Follow-up - 12 weeks do LFT - 12 weeks do spiro/dlco  - return 30 min slot with Dr Chase Caller  - ILD questionnaire and simple walk test at followup

## 2021-04-08 NOTE — Progress Notes (Signed)
Brief patient profile:  32 yowf  never smoker with allergies/inhalers as child outgrew by [employer] then  RA since around 2000  Prednisone  x decades and prev eval by Dr Joya Gaskins around 2004 for sob resolved s maint rx and referred 05/26/2013 by Dr Shelia Media for bronchitis and abn cxr   History of Present Illness  05/26/2013 1st [facility] office visit/ Wert cc June 2014 dx pna  In [place] and remicade stopped and 100% better and placed arencia in September 2014  then abruptly worse first week in November with cough green sputum s nasal symptoms, fever low grade and no cp or cough and completely recovered prior to [place] does not recall abx but issue is why keeps getting sick and abn CT Chest (see below).   Arthritis symptoms well controlled at present on Rx for RA rec Nexium 40 mg Take 30-60 min before first meal of the day and add pepcid 20 mg one at bedtime whenever coughing.     10/19/2017  f/u ov/Wert re:  RA lung dz  Chief Complaint  Patient presents with   Follow-up    Cough is much improved, but has not resolved yet. Cough is non prod. She has not had to use her neb.   Dyspnea:  Not limited by breathing from desired activities  But some doe x steps Cough: daytime > noct dry  Sleep: fine  SABA use:  No saba Medrol 4 mg a/w 33m per day/ ok control of arthritis  rec Start back on gabapentin up to 300 mg each am  in addition to the the two at bedtime  If not better increase the medrol to 8 mg daily until bettter then taper back to where you      01/10/2018  f/u ov/Wert re:  RA  Lung dz Medrol  4 mg  One alternating with a half Chief Complaint  Patient presents with   Follow-up    PFT's done. Her breathing has been gradually worsening since the last visit. She has occ cough- non prod.   Dyspnea gradually worse since last ov:  MMRC1 =  MMRC3 = can't walk 100 yards even at a slow pace at a flat grade s stopping due to sob    Gradually x 3 m / more fatigue / no change in  arthritis  Cough: not an issue rec Protonix 40 mg Take 30-60 min before first meal of the day  GERD diet   01/17/2018 acute extended ov/Wert re: cough on medrol 4 mg  One a/w one half  Chief Complaint  Patient presents with   Acute Visit    started coughing 01/11/18- occ prod with minimal green sputum.  She states also wheezing and having increased SOB.    abruptly worse 01/11/18 with severe 24/7 coughing >>  prod min green mucus esp in am/ assoc with subjective wheeze and did not follow previous contingencies re flutter / saba/ increase medrol and admits she does not rember those written instructions nor how to use the neb provided .  No fever/ comfortable at rest sitting  rec For cough > mucinex dm 1200 mg every 12 hours and cough into the flutter valve as much as possible  Doxycycline 100 mg twice daily x 10 days with glass of water Medrol 49mx 2 now and take 2  daily until cough is better then 1 daily x 5 days and then resume the previous dose  Shortness of breath/ wheezing/ still coughing >  albuterol neb every 4 hours as needed      01/20/2018 acute extended ov/Wert re: refractory cough and sob 01/11/18 Chief Complaint  Patient presents with   Acute Visit    she is not feeling better, coughing , very SOB, wheezing  mucus now clear/scant  on doxy/ neb machine not working (tube would not plug into the side s adequate force and she was not capable of applying it due to RA hands. Cough/ wheeze/ sob 24/7 / flutter not helping/ can't lie down at hs   rec While coughing protonix 40 mg Take 30- 60 min before your first and last meals of the day  Shortness of breath/ wheezing/ still coughing > albuterol neb every 4 hours as needed  Depomedrol 120 mg IM and medrol 32 mg daily x 2 days,  then 16 mg x 3 days,  Then 8 mg x 4 days , then resume the 4 mg daily  For severe cough > tylenol 3# one every 4 hours if needed  Go to ER if condition worsens on above plan       Date of admission: 01/22/2018              Date of discharge: 01/27/2018   History of present illness: As per the H and P dictated on admission, " Stacey [name]  is a 80 y.o. female, w Rheumatoid arthritis, ILD Asthma, apparently c/o increase in dyspnea this evening. Dry cough.   Denies fever, chills, cp, palp,  N/v, diarrhea, brbpr, black stool.   Pt notes recently being given steroid injection in office as well as being placed on doxycycline. This might have helped slightly but pt worse  Hospital Course:  Summary of her active problems in the hospital is as following. 1 dyspnea/hypoxemia/ILD Concerned that likely GERD Is causing ILD. Patient with cough.   Patient with complaints of awakening with cough and also with oral intake which is slightly improved since 01/24/2018. assessed by speech therapy and speech therapy raising concern of esophageal component but no signs of aspiration.   2D echo with a EF of 55 to 60% with no wall motion abnormalities, grade 1 diastolic dysfunction.  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 Pulmonary felt that the patient should be on scheduled Reglan. I have placed the patient on scheduled potassium before sleep. Continue steroids on discharge continue Mucinex and Claritin as well as inhalers. Patient will follow-up with pulmonary outpatient   2.  Gastroesophageal reflux disease Continue PPI and H2 blocker.  I changed PPI to [facility].   3.  Rheumatoid arthritis Outpatient follow-up.     4.  Anxiety Continue Effexor.       All other chronic medical condition were stable during the hospitalization.  Patient was ambulatory without any assistance. On the day of the discharge the patient's vitals were stable , and no other acute medical condition were reported by patient. the patient was felt safe to be discharge at home with family.   Consultants: PCCM  Procedures: Echocardiogram       03/21/2018  f/u ov/Wert re:   S/p admit was transiently  better  and downhill since Labor day on medrol 4 mg daily  Last orencia on Sept 4th 2019  Chief Complaint  Patient presents with   Acute Visit    Per patient, she has had a dry cough since July 2019. She has been wheezing as well. Increased fatigue. Body aches. Denies any fever or chills.   Dyspnea:  MMRC4  = sob if tries to leave home or while getting dressed   Cough: harsh/ hacking mostly dry/ has flutter not using    SABA use: not much better with rx   No obvious day to day or daytime variability or assoc excess/ purulent sputum or mucus plugs or hemoptysis or cp or chest tightness, subjective wheeze or overt sinus or hb symptoms.     Also denies any obvious fluctuation of symptoms with weather or environmental changes or other aggravating or alleviating factors except as outlined above   No unusual exposure hx or h/o childhood pna/ asthma or knowledge of premature birth.   INpatient consult 03/26/18 80 year old with rheumatoid arthoritis.At baseline the patient lives at home with her husband and is independent of ADLs.  Has been on many immune suppressants over decades and curently on orencia x 4 year and prednisone. Does not recollect being on bactrim/dapsone. Chart mentions BOOP/MAI in 2001 but she denies this. 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 Got admitted. Rx with steroids. Per Notes - clnical suspicion of  arytenoid inflmmation related wheeze noticed (she also reports asthma NOS). Follolwup with ENT recommended (but not seen one as yet). She also appears to have passed swallow with rec for regular diet with thin liquids but did to have mild eso stricture and reflux during testing . PFT shows 10% FVC decline for first ime. CT chest at this tme (aug 2019) showed new rLL infiltrate.  ECHO July 2019 without evicence of elevated  PASP and saw cards [facility] June 2019 and was considered to have worsenin dyspnea due to [facility] issues (reports stress test at [facility] that was normal but I cannot see it)   She reports after discharge she got better but in last several weeks has deteriorated with cough and dyspnea. There is new hypoxemia (currently RA with nail polish and poor circulation  - 89% pulse ox) needing 2L [place]. Per [facility] improved with steroids and abx. CTA 03/24/2018 => shows that RLL inifltrate has improved . Other chronic ILD changes + and small  Hiatal hernia + witthout change.   Review of lab work does show eosinophilia at time of admision   EVENTS 03/21/18 - IgE -5, blood allegy panel - negative, 03/23/2018 - - admit . HIGH EOS 2300, ESR 48, BNP 89 , HIV neg 9/12- PCT negative, RVP negative 9/14 -  PCT < 0.1, Urine strep - negative, MRSA PCR - positive. IgE - normal 4, Blood allergy panel repeat - negative 03/26/2018 - leading consideration for airway (BO in RA +/- asthma) related flare either due to MRSA bronchitis or clinical suspicion of arytenoid inflmmation +/- GERD relatd flare (she has small hiatal hernia)  +/- ? Dysphagia  up causing mild hypoxemia acute resp failure, wheeze . Allergy and IgE blood work negative thought. Patient reports being better but says she is choking on drinkin water [facility] MD says wheeze improved significantly with steroids.  RN says was down to RA yesterday evening but needed 1L [place] at sleep. Today -Room air at rest 94% and desaturated to 86% walking 60 feet 03/27/2018  - better. Off o2 at rest. STill coughs with water and when lies down.  Husband at bedside. Both requesting ILD clinic followup . Desaturated t 79% walking 90 feet.   OV 04/12/2018  Subjective:  Patient ID: Stacey [name], female , DOB: 02/10/1941 , age 27 y.o. , MRN: [id number] , ADDRESS: [place] [address]   04/12/2018 -   Chief Complaint  Patient presents with   Consult    Pt is a former MW pt.  Pt  denies any current complaints of cough, SOB, or CP but states the cough she originally had ended her up in the hosp 9/11-9/17 with dx acute respiratory failure. Pt does wear 2pulse with exertion and also wears 2L continuous when at home.     HPI WAYNETTE TOWERS 80 y.o. -presents for follow-up to the ILD clinic.  She is known to have rheumatoid arthritis with ILD changes.  She had been followed by Dr. Christinia Gully.  However in July 2019 in September 2019 she has had 2 admissions to the hospital with respiratory distress and hypoxemic respiratory failure.  In the first 1 that seem to be right lower lobe infiltrate and then she improved from it but in the second 1 even though the right lower lobe infiltrates were better she still was hypoxemic.  Acid reflux and dysphagia was considered a possible etiology but she passed swallow study 2 times.  They thought she had some reflux.  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  At the same time it is not clear if the ILD is progressive based on pulmonary function testing below   At this point in time she tells me that she is getting home physical therapy.  Her fatigue is improving but it is not fully resolved.  She was discharged on continuous oxygen which she is using.  However she is feeling less short of breath.  Today in fact when we turned her oxygen off and walked her she did not desaturate and this is a significant improvement.  She is on monthly Orencia through the [facility] rheumatologist Dr. Eda Paschal.  At this point in time she is put the Orencia on hold.  She told me that she is been on Orencia for 4 years and never had a respiratory exacerbation still recently x 2.  Although before going on Orencia she had pneumonia while on Remicade and the Remicade.  In terms of her rheumatoid arthritis she hardly has any pain.  Her joint architecture is fairly well-preserved because of various immunomodulators over  time.  She says that she was on Remicade for years and when she stopped it for 8 weeks before the switch to [place] she never really had a relapse in her rheumatoid arthritis.  She is largely pain and stiffness free.  She believes she can go without  her Orencia for a while.  Review of the literature shows greater than 10% chance of a respiratory infection especially COPD exacerbation.  Although the time frame for this is unclear.       OV 06/07/2018  Subjective:  Patient ID: Stacey [name], female , DOB: 01/31/1941 , age 2 y.o. , MRN: [id number] , ADDRESS: [place] [address]   06/07/2018 -   Chief Complaint  Patient presents with   Follow-up    ILD, PFT done today, some wheezing and coughing but better tha before   Rheumatoid arthritis ILD and asthma/obstructive lung disease phenotype on Dulera  HPI KAMALA KOLTON 81 y.o. -presents for routine follow-up with her husband.  She is here to follow-up with [facility] Dr. Stann Mainland.  She plans to do this in  December 2019.  She continues to be off Orencia.  Her joints are slowly getting stiff again.  She believes that she will need to be back on immunosuppression agent again.  She currently continues Medrol 4 mg alternating with 2 mg.  This for her rheumatoid arthritis.  In terms of her joints she continues on Medrol 4 mg alternating with 2 mg but not on any other immunosuppression agent.  Overall she is been stable but for the last 2 weeks has had green sputum and wheezing and chest congestion and cough.  She recently visited her husband who was hospitalized and walking the long hallways at [facility] made a short of breath but she thinks this is probably baseline for her.  There are no other new issues.  She did have spirometry and DLCO and this shows a decline compared to September 2019 and a similar to July 2019.  It is documented below.  This is probably reflective of a flareup   OV  07/21/2018  Subjective:  Patient ID: Stacey [name], female , DOB: [birth year] , age 69 y.o. , MRN: [id number] , ADDRESS: [place] [address]   07/21/2018 -   Chief Complaint  Patient presents with   Follow-up    Pt states she has been doing well since last visit. States she is about to begin Rituxan with [facility]. Pt still becomes SOB with exertion. Denies any complaints of cough or CP.   Rheumatoid arthritis ILD and asthma/obstructive lung disease phenotype on Dulera  HPI OLEVA KOO 80 y.o. -presents for follow-up of the above.  Last seen just before Thanksgiving 2019.  In the interim overall stable although on June 23, 2018 she climbs a steep flight of stairs which is unusual exertion for her and she became very dyspneic.  Following day saw Dr. Stann Mainland at [facility] rheumatology and was given Z-Pak and prednisone and started feeling better.  Although it is not fully clear to me she had fever and bronchitic symptoms.  I reviewed Dr. Stann Mainland note.  Dr. Stann Mainland is decided to start Rituxan for rheumatoid arthritis.  She is only having some minimal joint pain at this point.  She is off Orencia and continues to be off [place].  She did have some blood work with [place] before starting Rituxan.  She is due to see Dr. Stann Mainland within the next week and start her Rituxan.  Her liver function test July 18, 2017 is normal hemoglobin is normal.  CRP is also normal.  We did spirometry and walking desaturation test.  These show improvement compared to before and these are documented below.  Currently she not using nighttime or daytime oxygen.  She is wondering if she could switch rheumatology care to [facility].  This is because while she likes [employer] she is getting older and more frail and feels some body local would be of help.  I have sent a message to Dr. Estanislado Pandy inquiring.  Certainly we can help her with Rituxan infusions at [facility] if needed.  She  will check on this with her [facility] rheumatologist.       OV 03/27/2019  Subjective:  Patient ID: Stacey [name], female , DOB: 12-28-1940 , age 76 y.o. , MRN: [id number] , ADDRESS: [place] [address]  Rheumatoid arthritis ILD and asthma/obstructive lung disease phenotype on [facility]   03/27/2019 -  Rourine fu   HPI Stacey [name] 80 y.o. -presents for  the above.  Last seen in January 2020.  After that she has seen Dr. Stann Mainland rheumatology at [facility].  She is now getting Rituxan 2 doses every 6 months.  She says this is helped her joints and her stiffness.  She is a little bit more mobile than usual.  However in terms of her respiratory status she continues to have episodic cough.  In June 2020 she again got hypoxemic and got admitted.  Since then she has had episodic cough.  She had a respiratory exacerbation in June 2020 for the admission she got steroids.  This seemed to help.  She is also on a higher dose Dulera right now.  In terms of her cough this seems to be her biggest problem.  She seems to be on Dulera, Singulair scheduled with also Tessalon and Delsym and DuoNeb.  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  Her last high-resolution CT scan of the chest was 1 year ago.  She says the dyspnea itself is not worse.  She is currently not using oxygen.  On exam she did have some wheezing.  Currently she has white and brown sputum but this is baseline.  In terms of a COVID wrist she has been tested recently couple of times and this is been negative.  She is isolating well.  She wanted to know COVID prevention activities and risk status and masking strategies.      OV 05/24/2019  Subjective:  Patient ID: Stacey [name], female , DOB: [birth year] , age 39 y.o. , MRN: [id number] , ADDRESS: [place] [address]   05/24/2019 -   Chief  Complaint  Patient presents with   Follow-up    Pt was recently in the hosp due to ILD. Pt states that she has been better since being out of the hosp.   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 -returns for follow-up.  At the last visit approximately 2 months ago she was reporting worsening cough following an admission in summer 2020.  Her pulmonary function test suggested worsening ILD status.  Therefore we requested a high-resolution CT chest which she did in October 2020.  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  However in the interim after the CT scan was done towards the end of October 2020 she developed worsening of her cough over 2 weeks and also associated shortness of breath but significantly the cough is much worse.  She ended up getting admitted to the hospital.  There was some hypoxemia.  By this time she had finished a ENT evaluation that did not show any involvement of the arytenoids.  Pulmonary was consulted.  She was given a prednisone burst which she just finished I believe yesterday.  She is back on her baseline Medrol.  And she is feeling better.  Her oxygen status is improved although she is using oxygen at night now.  She is really frustrated with these recurrent flareups and these admissions which ended up with her having a wheeze and also hypoxemia.  Currently she is on her baseline Medrol for rheumatoid arthritis associated with Dulera and Singulair.  She is also on losartan for blood pressure.  Her walking desaturation test is slightly worse than baseline.  She has a GI consult pending because of the recurrent episodes of cough and flareups.  We went over exposure history.  We used interstitial lung disease questionnaire for the exposure history.  Specifically she denies any electronic cigarette use of  marijuana use of cocaine use or any IV drug abuse.  [place] the last 14 years.  Asked extensive questions about the home environment it is positive for nebulizer use but the nebulizer does not have mildew or mold in it.  Otherwise no organic antigen exposure.  The house is not damp.  There is no mold or mildew in the shower curtain.  There is no humidifier use no steam iron use.  No Jacuzzi use.  No misting [place] outside to inside the house.  No pet birds.  No pet gerbils no feather pillows.  There is no mold in the [facility] duct.  She does not do any gardening.  Does not use wind instruments.  Also 122 question occupational history elicited and essentially negative.  The other issue is that she has polypharmacy.  She is asking for my help in reducing her medications.  She is on 3 medications for postherpetic neuralgia.  She is on losartan      OV 06/21/2019  Subjective:  Patient ID: Stacey [name], female , DOB: 05/28/1941 , age 48 y.o. , MRN: [id number] , ADDRESS: [place] [address]   06/21/2019 -   Chief Complaint  Patient presents with   televisit    hosp 11/29-12/4 due to covid with pna. pt said that she is doing okay after recent hosp but states she has no energy.      Stacey [name] 80 y.o. - last visit 05/24/2019. Diagnosed with covid-19 on 05/31/2019. Admitted 06/11/2019  - 06/16/2019. Quarantine ends 06/21/2019 today. Rx with Remdesviri and Decadron. Husband also on phone. Questions  1. Quarantine ends - from 06/22/19\ and she is not contagious and likely resistant to reinfection to covid for another few months  2. She is on 10 day dexamethasone for covid - today is last day for it  3. She is on dulera. Hospital gave combivent and she does not want do combivent - this is fine  4. Ofev for ILD - not started it yet . She had questions about side effects. Explained GI and LFT monirtong. SHe wanted to  wait till Jan 2021 and start it . I am ok with that.   5. Small hiat   IMPRESSION: HRCT OCt 2020 1. 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 Findings have progressed since 2017 and 2019 high-resolution chest CT studies. 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 Findings are consistent with UIP per consensus guidelines: Diagnosis of Idiopathic Pulmonary Fibrosis: An Official ATS/ERS/JRS/ALAT Clinical Practice Guideline. [place], Iss 5, [phone], Mar 13 2017. 2. 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 4. Small hiatal hernia.   Aortic Atherosclerosis (ICD10-I70.0).     Electronically Signed   By: Ilona Sorrel M.D.   On: 04/24/2019 13:29    OV 08/03/2019 - face to face visit  Subjective:  Patient ID: Stacey [name], female , DOB: [birth year] , age 67 y.o. , MRN: [id number] , ADDRESS: [place] [address]    Rheumatoid arthritis with Joint s/ILD- Rituxan/steroids ([facility])  - progressive phenotype (CT 2017/2019 -> Oct 2020). Planned Ofev start in Nov 2020 but did not start as of dec 2020. LFT normal Jan 2021  Athma/obstructive lung disease phenotype on Dulera/Singulair  Post herpetic neuralgia - on elavil, gabapentin, effexor  Coronary Artery Calcification - 1 vessel - cardiology referral done  Diagnosed with covid-19 on 05/31/2019. Admitted 06/11/2019  - 06/16/2019. Rx with Remdesviri and Decadron.  Small Hiatal hernia onCT Oct 2020  Rib fracture from fall Dec 2020: Musculoskeletal: There is nondisplaced fractures of the posterior left fifth, 6, 8, 9, and tenth left ribs. There is unchanged slight superior compression deformities of the T5, T6, T10 vertebral bodies with less than 25% loss in height.Associated pulmnary contusion  +    HPI Stacey [name] 80 y.o. -presents for a visit. Her nintedanib has been delayed because of COVID-19. Also subsequently a fall. She saw a nurse practitioner on July 20, 2019 and they took a shared decision to start nintedanib. She had a ? telephone visit with Dr. Candie Mile the rheumatologist at [facility]. Patient scheduled for her next Rituxan in February 2021 but reviewed the notes indicates this could be pushed to early spring 2021. Dr. Stann Mainland is okay with the patient study got intubated at this point in time.  Patient has follow-up with Dr. Stann Mainland tomorrow at [facility].  At this face-to-face visit she is here with her husband.  She says she is much better after the Covid and also the fall that fractured her ribs.  Nevertheless all this is left her fatigue.  Infective fatigue scores are much worse.  Also in the last 2 weeks has had increased cough wheezing and shortness of breath.  The sputum was also changed color in the last 1 week to clear green.  This is all new.  Her symptom score is therefore a worse than her baseline.  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  She is only taking 100 mg once a day.  The plan was to go up to 100 mg twice a day next week.  This is a minimum effective dose.  The maximum dose is 150 mg twice a day.  Given her age and comorbidities we are starting at a low dose.  Part of this visit is to make sure that she is tolerating the drug fine.  And so far she is.  She is interested in the ILD-pro registry study done by the [facility].   She is working with physical therapy for her fatigue.  Ambulatory Walk 07/20/2019 2 Lap- O2 92% RA; HR 109 No shortness of breath She did not need to stop Used walker    OV 09/20/2019  Subjective:  Patient ID: Stacey [name], female , DOB: 1941/05/20 , age 36 y.o. , MRN: [id number] , ADDRESS: [place] [address]   09/20/2019 -   Chief Complaint  Patient presents with   Televisit    Called and spoke with pt who stated she has been feeling okay since last visit. Pt stated she is still taking OFEV and denies any complaints. Pt states she believes her breathing is stable at this point.    Rheumatoid arthritis with Joint s/ILD- Rituxan/steroids ([facility])  - progressive phenotype (CT 2017/2019 -> Oct 2020). Planned Ofev start in Nov 2020 but did not start as of dec 2020. 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 Admitted 06/11/2019  - 06/16/2019. Rx with Remdesviri and Decadron.  Small Hiatal hernia on CT Oct 2020  Rib fracture from fall Dec 2020: Musculoskeletal: There is nondisplaced fractures of the posterior left fifth, 6, 8, 9, and tenth left ribs. There is unchanged slight superior compression deformities of the T5, T6, T10 vertebral bodies with less than 25% loss in height.Associated pulmnary contusion +   HPI MaBLAIRE PALOMINO910.o. - has 2nd covid vaccine later this week. Rituxan is end of the month. Baseline RA regiment is On dulera for associated obstructio of lung. Continue ofev 10070mid since mid jan 2021 for ILD. No side effects. Did see BetDerl Barrowr face to face visit early feb 2021 -> for bronchitis and felt better after prdnisone and zpak. Currently on medrol   8 mg day and staying there per BetDerl BarrowWants t oknow if she can taper. Wants to know how she can prevent care flare ups. Explained ofev, masking and social distancing  prevent respiratory infection.Denies choking on food or aspirating. Denies mold in house - relatively news. Denies dog. Denies cat. Denies carious teeth. Denies post nasal drip  OVerll feels better and improved dyspnea.     OV 12/18/2019  Subjective:  Patient ID: MarTamala Fothergillemale , DOB: [birth year]age 47 35o. , MRN: [id number]: [id number] RusShon BatonD Rheumatologist-Dr. JenEda Paschal [facility]: Dr. RamVeryl Speak/01/2020 -   Chief Complaint  Patient presents with   Follow-up    no worse   Rheumatoid arthritis with Joint s/ILD- Rituxan/steroids (duke uinviesty)  - progressive phenotype (CT 2017/2019 -> Oct 2020). Planned Ofev start in Nov 2020 but did not start as of dec 2020. 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 Admitted 06/11/2019  - 06/16/2019. Rx with Remdesviri and Decadron.  Small Hiatal hernia on CT Oct 2020  Rib fracture from fall Dec 2020: Musculoskeletal: There is nondisplaced fractures of the posterior left fifth, 6, 8, 9, and tenth left ribs. There is unchanged slight superior compression deformities of the T5, T6, T10 vertebral bodies with less than 25% loss in height.Associated pulmnary contusion +  -On Reclast once a year through Dr. JennifEda Paschal MargarTamala Fothergillo59-returns for follow-up of her ILD.  It has been a few month since I last saw her.  In this time she did not taper her Medrol down.   She stated 8 mg/day.  She was  originally taking the Medrol for her rheumatoid arthritis for many years.  She recently increased the dose from 4 mg / 2 mg every other day to 8 mg daily because of recurrent respiratory exacerbations.  She is asking for [facility] refills for obstructive lung disease.  She is getting Rituxan through Dr. Eda Paschal for rheumatoid arthritis.  Her next Rituxan is in September 2021.  She saw Dr. Stann Mainland in April 2021 and reviewed the note.  Her liver function test at the time was fine.  In terms of her ILD she continues on nintedanib 100 mg twice daily.  She says she has no side effects from the drug she is tolerating it quite well.  We discussed about increasing the dose but she wants to hold off because she just got a new supply.  However she is open to increasing the dose when the supply runs out.  She is due for liver function test today.  In terms of overall symptoms things have improved as can be seen below and the symptom score.Marland Kitchen  However her main concern is that of fatigue.  She is open to attending pulmonary rehabilitation.  She is not using oxygen at home.  Sometime back when we tested overnight oxygen desaturation test she did not desaturate.  Walking desaturation test today stable.  She does notice of note that her blood pressure has gone up.  She is working with her primary care physician on this.  She is on losartan.    OV 04/10/2020   Subjective:  Patient ID: Stacey [name], female , DOB: 03/04/1941, age 24 y.o. years. , MRN: [id number],  ADDRESS: [address] [place] [address] PCP  Shon Baton, MD Providers : Treatment Team:  Attending Provider: Brand Males, MD   Chief Complaint  Patient presents with   Follow-up    CT scan 8/23, has not increased Ofev, shortness of breath with exertion.    Rheumatoid arthritis with Joint s/ILD- Rituxan/steroids (duke uinviesty)  - progressive phenotype (CT 2017/2019 -> Oct 2020). Planned Ofev  start in Nov 2020 but did not start as of dec 2020. 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 Admitted 06/11/2019  - 06/16/2019. Rx with Remdesviri and Decadron.  Admitted August 2021 for respiratory infection not otherwise specified.  [place]  Small Hiatal hernia on CT Oct 2020  Rib fracture from fall Dec 2020: Musculoskeletal: There is nondisplaced fractures of the posterior left fifth, 6, 8, 9, and tenth left ribs. There is unchanged slight superior compression deformities of the T5, T6, T10 vertebral bodies with less than 25% loss in height.Associated pulmnary contusion +  -On Reclast once a year through Dr. JenEda Paschal HPI MarTamala [name] 25o. -returns for follow-up.  Last week by myself early June 2021.  Then after that in early August 2020 when she got admitted for respiratory infection not otherwise specified.  She was hypoxemic on room  air.  This followed a trip to [place].  In the follow-up phase end of August 2020 when she had high-resolution CT chest that showed increased groundglass opacities but when compared to a CT from 8 months ago.  Also chronic ILD had worsened.  I personally visualized the image at this point in time she is improved from this admission she is not using oxygen at all.  In fact walking desaturation test shows near baseline.  Nevertheless overall her symptom scores have declined over time ILD symptom score is listed  below.  She is really concerned about several issues.  In terms of her ILD she was inquiring about escalating to 150 mg twice daily.  Even at 100 mg twice daily she is significantly symptomatic in terms of nonrespiratory issues such as nausea and dizziness and fatigue.  I did caution her that these could all get worse.  Therefore she is just opted to be at 100 mg twice daily  -Recurrent admissions for respiratory issues [I did address that some of this was Covid and fall related and not necessarily her usual summer exacerbation].  Nevertheless, I asked about exposures at home.  She does not have a feather blanket or feather pillow or feather jacket.  She denies any mold or bird exposure or mildew exposure at home.  Does not do any gardening or wind instruments.  She is immunosuppressed and I did notice that she is not on Bactrim for PCP prophylaxis.  In 2019 when we checked G6PD the lab could not process this lab.  She is interested in Bactrim prophylaxis.  She is interested in okay getting G6PD rechecked   -She is dealing with significant amount of dizziness.  Neurology is sending her to neuro rehab.  It is believed it is multifactorial.  I reviewed her recent CT head angio report.  I personally visualized the image   -She is also worried about cough.  Currently cough is tolerable but she says prior to her exacerbations cough gets worse.  We did discuss with her that if she is aspirating which she denied.  We did discuss about the possibility of starting Bactrim and monitoring her cough and she is fine with that.  I did review old records Lungs/Pleura: Biapical pleuroparenchymal scarring. Patchy and largely peripheral peribronchovascular ground-glass, increased from 07/03/2019. Underlying subpleural reticulation, traction bronchiectasis/bronchiolectasis and scattered honeycombing, similar to minimally increased from 07/03/2019. Calcified granulomas. No pleural fluid. Airway is unremarkable. No air  trapping.   Upper Abdomen: Visualized portions of the liver, adrenal glands, kidneys, spleen, pancreas, stomach and bowel are unremarkable with the exception of a small hiatal hernia. Cholecystectomy. Calcified upper abdominal lymph nodes.   Musculoskeletal: Degenerative changes in the spine. No worrisome lytic or sclerotic lesions.   IMPRESSION: 1. Increased patchy pulmonary parenchymal ground-glass may be due to an atypical/viral pneumonia, including due to COVID-19. 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 2. Aortic atherosclerosis (ICD10-I70.0). Coronary artery calcification.     Electronically Signed   By: Lorin Picket M.D.   On: 03/04/2020 12:58    OV 08/01/2020  Subjective:  Patient ID: Stacey [name], female , DOB: 05-14-1941 , age 81 y.o. , MRN: [id number] , ADDRESS: [address] [place] [address] PCP Shon Baton, MD Patient Care Team: Shon Baton, MD as PCP - General (Internal Medicine) Buford Dresser, MD as PCP - Cardiology (Cardiology)  This Provider for this visit: Treatment Team:  Attending Provider: Brand Males, MD   Rheumatoid arthritis with Joint s/ILD- Rituxan/steroids (duke uinviesty)  - progressive phenotype (CT 2017/2019 -> Oct 2020). Planned Ofev start in Nov 2020 but did not start as of dec 2020. 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 Admitted 06/11/2019  - 06/16/2019. Rx with Remdesviri and Decadron.  Admitted August 2021 for respiratory infection not otherwise specified.  [place]  Small Hiatal hernia on CT Oct 2020  Rib fracture from fall Dec 2020: Musculoskeletal: There is nondisplaced fractures of the posterior left fifth, 6, 8, 9, and tenth left ribs. There is unchanged slight superior compression deformities of the T5, T6, T10 vertebral bodies with less than 25% loss in height.Associated pulmnary contusion +  -On Reclast once a year through Dr. JenEda Paschal 08/01/2020 -   Chief Complaint  Patient presents with   Follow-up    Doing well     HPI MarMAANASA [name] 27o. -returns for follow-up.  She says since last visit she has lost weight.  In fact tracking her weight it appears she is definitely lost weight.  She has occasional intermittent nausea once every few weeks this is because she does not time her nintedanib well with food.  She has a low appetite as well.  She tried to go up on the Internet but she is unable to.  She takes a low-dose 100 mg twice daily.  She is on Rituxan and prednisone.  She is up-to-date with her COVID-vaccine.  Current shortness of breath standpoint she is stable.  Symptoms are stable.  CT Chest data  OV 10/22/2020  Subjective:  Patient ID: MarTamala Fothergillemale , DOB: 6/2Jul 19, 1942age [age]o. , MRN: [id number]: [address] [id number] RusShon BatonD Patient Care Team: RusShon BatonD as PCP - General (Internal Medicine) ChrBuford DresserD as PCP - Cardiology (Cardiology)  This Provider for this visit: Treatment Team:  Attending Provider: RamBrand MalesD    10/22/2020 -   Chief Complaint  Patient presents with   Follow-up    Still having shortness of breath with activity, denies  increase in severity.     Rheumatoid arthritis with Joint s/ILD- Rituxan/steroids (duke uinviesty)  - progressive phenotype (CT 2017/2019 -> Oct 2020). Planned Ofev start in Nov 2020 but did not start as of dec 2020. 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 Admitted 06/11/2019  - 06/16/2019. Rx with Remdesviri and Decadron.  - sp evushed x 2  Admitted August 2021 for respiratory infection not otherwise specified.  Following trip to [place].  Was hypoxemic.  Small Hiatal hernia on CT Oct 2020  Rib fracture from fall Dec 2020: Musculoskeletal: There is nondisplaced fractures of the posterior left fifth, 6, 8, 9, and tenth left ribs. There is unchanged slight superior compression deformities of the T5, T6, T10 vertebral bodies with less than 25% loss in height.Associated pulmnary contusion +  -On Reclast once a year through Dr. Eda Paschal  Normal ECHO Summer 2020   HPI UMAIMA SCHOLTEN 80 y.o. -returns for follow-up.  At this point in time for her ILD she is taking nintedanib 100 mg twice daily.  Last liver function test was in December 2021.  She is now attending pulmonary rehabilitation.  She is not sure it is helping her.  She tells me that she does not desaturate at pulmonary rehabilitation so she not using her oxygen.  Review of the records indicate same but it appears that today her dyspnea is worse in terms of symptom score although she is denying that.  Walking desaturation  test in the office today with a forehead probe suggest that pulse ox is declined.  Her pulmonary function test also shows 5% FVC decline.  She was surprised by this.  In talking to her realize that for coexistent obstructive lung disease she is no longer taking Dulera.  She is willing to take inhaler which she will have to twist instead of having to pump with her fingers because she has rheumatoid arthritis.  She is no longer losing weight though.  She had questions about taking monoclonal antibody prophylaxis.  She is under the impression she needs to take it every 6 months with Rituxan dosing.  I did explain to her that in the presence of Rituxan the body is not able to respond actively to vaccine and that is why she is having prophylactic antibody.  She is now had 2 doses the last 1 being a few weeks ago first 1 was in January 2022.  I have written to the [facility] coordinator Wilber Bihari 27 monoclonal antibody prophylaxis schedule.  Expressed to patient that because of monoclonal antibody prophylaxis she did not do the vaccine.  Last echocardiogram was in summer 2020.      OV 12/23/2020  Subjective:  Patient ID: Stacey [name], female , DOB: 1940/10/18 , age 38 y.o. , MRN: [id number] , ADDRESS: [place] [address] PCP Shon Baton, MD Patient Care Team: Shon Baton, MD as PCP - General (Internal Medicine) Buford Dresser, MD as PCP - Cardiology (Cardiology)  This Provider for this visit: Treatment Team:  Attending Provider: Brand Males, MD   12/23/2020 -   Chief Complaint  Patient presents with   Follow-up    PFT performed today. Pt states she has been doing okay since last visit and denies any complaints.     HPI Stacey [name] 80 y.o. -returns for follow-up.  There is concern for progression.  Last visit we have made sure she added Breo.  She says she feels better.  In fact symptom score is better.  However pulmonary function test shows  continued decline.  She is on the low-dose nintedanib.  She says if she does not take the nintedanib exactly with food and even if  she takes it 15 to 20 minutes later she will have nausea.  Today she had nausea but otherwise if she is diligent she can maintain.  I informed her the pulmonary function test shows continued decline.  She is on Rituxan prednisone and low-dose nintedanib.  She initially said she will not be able to tolerate the higher dose nintedanib but after realizing that her nausea is mild and her weight loss is stable she says she is willing to give the higher dose nintedanib and attend.  I was able to get her some donor sample of nintedanib.  The lot number of this is [id number].  Expiration date is November 2023.  The serial number is [id number].  The GT IN number is [id number]       ECHO 11/3120  IMPRESSIONS     1. Left ventricular ejection fraction, by estimation, is 65 to 70%. The  left ventricle has normal function. The left ventricle has no regional  wall motion abnormalities. Left ventricular diastolic parameters are  consistent with Grade I diastolic  dysfunction (impaired relaxation). Elevated left ventricular end-diastolic  pressure. The E/e' is 22. The average left ventricular global longitudinal  strain is -18.4 %.   2. Right ventricular systolic function is normal. The right ventricular  size is normal. There is mildly elevated pulmonary artery systolic  pressure. The estimated right ventricular systolic pressure is 94.5 mmHg.   3. The mitral valve is abnormal. Mild mitral valve regurgitation.   4. The aortic valve is tricuspid. Aortic valve regurgitation is not  visualized. Mild aortic valve sclerosis is present, with no evidence of  aortic valve stenosis.   5. The inferior vena cava is normal in size with greater than 50%  respiratory variability, suggesting right atrial pressure of 3 mmHg.   Comparison(s): Changes from prior study are noted. 12/18/18  EF 60-65%. PA  pressure 446mHg.    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  OV 03/10/2021  Subjective:  Patient ID: MTamala [name] female , DOB: [birth year], age 80y.o. , MRN: [id number], [place] [id number] RShon Baton MD Patient Care Team: RShon Baton MD as PCP - General (Internal Medicine) CBuford Dresser MD as PCP - Cardiology (Cardiology)  This Provider for this visit: Treatment Team:  Attending Provider: RBrand Males MD    03/10/2021 -   Chief Complaint  Patient presents with   Follow-up    Pt states she has questions about ofev she currently on it but having side effects.    Rheumatoid arthritis with Joint s/ILD- Rituxan/steroids (duke uinviesty)  - progressive phenotype (CT 2017/2019 -> Oct 2020). Planned Ofev start in Nov 2020 but did not start as of dec 2020. 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 Admitted 06/11/2019  - 06/16/2019. Rx with Remdesviri and Decadron.  - sp evushed x 2  - 2nd covid by hx June 2022 - evusheld repeat Oct 2022  Admitted August  2021 for respiratory infection not otherwise specified.  Following trip to [place].  Was hypoxemic.  Small Hiatal hernia on CT Oct 2020  Rib fracture from fall Dec 2020: Musculoskeletal: There is nondisplaced fractures of the posterior left fifth, 6, 8, 9, and tenth left ribs. There is unchanged slight superior compression deformities of the T5, T6, T10 vertebral bodies with less than 25% loss in height.Associated pulmnary contusion +  -On Reclast once a year through Dr. 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  -returns for follow-up.  She presents with her husband Festus Aloe.  Last visit she wanted to increase the nintedanib to 150 mg twice daily but this is made her have more nausea and vomiting.  The diarrhea is very mild.  She then reduced her nintedanib 200 mg twice daily but still having side effects.  She is now started losing weight again.  She is really frustrated by this.  Her husband asked about pirfenidone.  Explained that the research with progressive pulmonary fibrosis is limited in the case of pirfenidone so nintedanib is the first choice.  Did agree that if nintedanib did not work we would try pirfenidone.  She wanted know what her options were.  We discussed about stopping nintedanib and improving quality of life but face the risk of continued progression pulmonary fibrosis.  She was not that enthused about this choice.  We discussed about trying Zofran with the nintedanib.  She seemed more receptive to this idea.  We decided that we will try with 100 mg twice daily at the lower dose and then see.  She believes asthma is under control.  Her pulmonary function test shown below seems to fluctuate.  Overall symptom severity stable.  She wanted a handicap placard signed today which I did.       OV 04/08/2021  Subjective:  Patient ID: Stacey [name], female , DOB: 01-27-1941 , age 89 y.o. , MRN: [id number] ,  ADDRESS: [place] [address] PCP Shon Baton, MD Patient Care Team: Shon Baton, MD as PCP - General (Internal Medicine) Buford Dresser, MD as PCP - Cardiology (Cardiology)  This Provider for this visit: Treatment Team:  Attending Provider: Brand Males, MD    04/08/2021 -   Chief Complaint  Patient presents with   Follow-up    Pt states she has been doing okay since last visit and denies any real complaints. States breathing has been doing okay and states the nausea went away.    Rheumatoid arthritis with Joint s/ILD- Rituxan/steroids ([facility])  - progressive phenotype (CT 2017/2019 -> Oct 2020). Planned Ofev start in Nov 2020 but did not start as of dec 2020. 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 Admitted 06/11/2019  - 06/16/2019. Rx with Remdesviri and Decadron.  - sp evushed x 2  Admitted August 2021 for respiratory infection not otherwise specified.  Following trip to [place].  Was hypoxemic.  Small Hiatal hernia on CT Oct 2020  Rib fracture from fall Dec 2020: Musculoskeletal: There is nondisplaced fractures of the posterior left fifth, 6, 8, 9, and tenth left ribs. There is unchanged slight superior compression deformities of the T5, T6, T10 vertebral bodies with less than 25% loss in height.Associated pulmnary contusion +  -On Reclast  once a year through Dr. Eda Paschal  Normal ECHO Summer 2020 -> possible [facility] May 2021 RSVP 54s with GR1 ddx  HPI JASMINE MCBETH 80 y.o. 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  Last seen 4 weeks ago.  At that time she was losing weight and having significant GI side effects with nintedanib full dose.  Told her to reduce the nintedanib dose to 100 mg twice daily and also take Zofran as needed.  She is taking Zofran as needed.  She is tolerating the nintedanib low-dose much better.  Symptom scores are listed below.  Overall she feels better.  Weight loss is stabilized.  Infectious gained 1 pound of weight.  She feels that she will just take with nintedanib 100 mg twice daily using Zofran as needed.  She does not want to change the schedule.  I fully agree with her and supported on that.  She is aware that the low-dose is less effective but this might be best balance between risk and benefit.  She did ask about getting a COVID by Valent mRNA booster 2 oh micron and BA.5.  According to history she has had COVID x2.  Most recently was in June 2022 which would be against the current viral strain.  In addition she is got monoclonal antibody prophylaxis euvsheld in past.  She does socially isolate herself to the extent possible and masks.  She is going to get the monoclonal antibody prophylaxis again in a few weeks when she gets her Rituxan.  We did discuss the fact that because she is on Rituxan she is immunosuppressed and does not make antibodies and then the facet antibody transfer that she gets through [facility] is risk protective.  Additional risk protective feature is the fact that she had a current strain of COVID-19 and potentially has natural immunity.  Therefore it is her call to get the bivaet booster although she wanted to avoid it and I supported this in a shared decision making she could hold off till at least January 2023 when it  will be 6 months since her natural infection.      SYMPTOM SCALE - ILD 03/27/2019  05/24/2019 (2-3 weeks pre cpovid)  08/03/2019 Post covid Post fall rib # 12/18/2019  04/10/2020 142# resp virus admit aug 2021 nos 08/01/2020 134#  10/22/2020 136# Now doing rehab 12/23/2020 136# 03/10/2021 131# Ofev 140m bid 04/08/2021 132# Ofev 1068mbid  O2 use ra       ra ra ra  Shortness of Breath 0 -> 5 scale with 5 being worst (score 6 If unable to do)           At rest 0 '2 3 2 ' 3  0 3 0 3 1  Simple tasks - showers, clothes change, eating, shaving '0 3 2 3 4 1 3 2 3 1 '  Household (dishes, doing bed, laundry) '2 4 5 3 4 3 3 3 4 3  ' Shopping '2 3 6 4 4 4 4 3 4 4  ' Walking level at own pace '2 3 4 4 3 2 4 2 3 3  ' Walking up Stairs '3 3 5 4 4 4 4 3 4 4  ' Total (40 - 48) Dyspnea Score '9 17 25 20 22 14 21 13 21 16  ' How bad is your cough? '2 2 3 1 2 ' 0 0 0 1 0  How bad is your fatigue 3 3.5 5 3.'5 3 4 3 2 3 3  ' nause    0 1 1 0 1 2.5 1  vomit    0 1 1 0 1 2.4 1  diarrhea    0 1 0 0 0 1 0  anxiety    0 2 0 1 0 1 0  depression    0 1 0 0 0 0 0     Simple office walk  feet x  3 laps goal with forehead probe 04/12/2018  07/21/2018  05/24/2019  12/18/2019  04/10/2020  08/01/2020  10/22/2020  03/10/2021   O2 used Room air - off o2 x 10 min Room air  Room air ra ra ra ra ra  Number laps completed 3 x 185 feet 3 x 250 feet    r    Comments about pace normal normal   Slow, no walker Slow . No walker.  Slow. No walker. No cane walker  Resting Pulse Ox/HR 99% and 72/min 97% and 74/min 97% and 81/min 97% and 76/m 96% and 88/min 94% and 83 96% and HR 79 97% and HR 51  Final Pulse Ox/HR 98% and 95/min 96% and 96/min 94% and 107/min 93% and 94 92% and 97 90% and 100 89% and 104/min 92% and 106  Desaturated </= 88% no no no no no yes yes no  Desaturated <= 3% points no no Yes, 3 pints Ues, 4 points Yes,  4 points Yes, 4 points Yes, 7 points   Got Tachycardic >/= 90/min yes yes yes yes yes yes yes   Symptoms at end of test No  complaint Mild dyspnea Mod dyspnea Some dyspnea dyspnea dyspnie cat 3d Dyspneic moderate  Dyspneic and wheezing  Miscellaneous comments improed from hospital Same v improved woprse same  Wobbly gait baseline Needed 2 break        PFT  PFT Results Latest Ref Rng & Units 03/07/2021 12/23/2020 10/22/2020 09/10/2020 04/08/2020 12/12/2019 03/27/2019  FVC-Pre L 1.78 1.66 1.73 1.76 1.83 1.90 1.79  FVC-Predicted Pre % 66 60 63 64 66 68 64  FVC-Post L - - - - - - -  FVC-Predicted Post % - - - - - - -  Pre FEV1/FVC % % 72 71 76 75 74 78 79  Post FEV1/FCV % % - - - - - - -  FEV1-Pre L 1.28 1.17 1.30 1.32 1.35 1.48 1.42  FEV1-Predicted Pre % 64 57 64 64 66 71 68  FEV1-Post L - - - - - - -  DLCO uncorrected ml/min/mmHg 12.94 14.98 13.67 15.38 14.39 13.91 15.04  DLCO UNC% % 66 77 70 79 74 71 77  DLCO corrected ml/min/mmHg 12.94 14.98 13.80 15.38 15.49 13.91 -  DLCO COR %Predicted % 66 77 71 79 79 71 -  DLVA Predicted % 84 86 80 102 108 86 96  TLC L - - - - - - -  TLC % Predicted % - - - - - - -  RV % Predicted % - - - - - - -       has a past medical history of Abnormal finding of blood chemistry, Asthma, H/O measles, H/O varicella, Hypertension, 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[place]), 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   reports that she has never smoked. She has never used smokeless tobacco.  Past Surgical History:  Procedure Laterality Date   CHOLECYSTECTOMY  2011   IR ANGIO INTRA EXTRACRAN SEL COM CAROTID INNOMINATE BILAT MOD SED  06/18/2020   IR ANGIO VERTEBRAL SEL SUBCLAVIAN INNOMINATE UNI R MOD SED  06/18/2020   IR ANGIO VERTEBRAL SEL VERTEBRAL UNI L MOD SED  06/18/2020   WISDOM TOOTH EXTRACTION      Allergies  Allergen Reactions   Penicillins Other (See Comments)    Reaction unknown occurred during childhood Has patient  had a PCN reaction causing immediate rash, facial/tongue/throat swelling, SOB or lightheadedness with hypotension: Unknown Has patient had a PCN reaction causing severe rash involving mucus membranes or skin necrosis: Unknown Has patient had a PCN reaction that required hospitalization: No Has patient had a PCN reaction occurring within the last 10 years: No If all of the above answers are "NO", then may proceed with Cephalosporin use.  Unsur   Remicade [Infliximab] Other (See Comments)    Reaction unknown   Sulfa Antibiotics Other (See Comments)    Reaction unknown    Immunization History  Administered Date(s) Administered   Fluad Quad(high Dose 65+) 04/23/2020   Influenza Split 04/12/2013, 04/12/2014, 03/14/2015, 04/23/2016   Influenza, High Dose Seasonal PF 04/12/2017, 03/24/2018, 04/13/2019, 03/27/2021   Influenza-Unspecified 04/03/2013, 04/22/2017   Moderna Sars-Covid-2 Vaccination 08/25/2019, 09/22/2019, 03/07/2020   Pneumococcal Conjugate-13 04/03/2013, 09/28/2014   Pneumococcal Polysaccharide-23 07/13/2012, 07/24/2013   Td 01/11/2018   Tdap 12/10/2017    Family History  Problem Relation Age of Onset   Asthma Mother    Anemia Mother    Polymyalgia rheumatica Mother    COPD Father    Pulmonary fibrosis Father    Breast cancer Maternal Grandmother 88     Current Outpatient Medications:    amitriptyline (ELAVIL) 25 MG tablet, Take 25 mg by mouth at bedtime., Disp: , Rfl: 0   benzonatate (TESSALON) 100 MG capsule, Take 100 mg by mouth as needed for cough., Disp: , Rfl:    beta carotene w/minerals (OCUVITE) tablet, Take 1 tablet by mouth daily., Disp: , Rfl:    Biotin 1000 MCG tablet, Take 1,000 mcg by mouth daily., Disp: , Rfl:    cholecalciferol (VITAMIN D) 1000 UNITS tablet, Take 1,000 Units by mouth daily., Disp: , Rfl:    Estradiol 10 MCG TABS vaginal tablet, Place 10 mcg vaginally daily as needed (dryness). , Disp: , Rfl:    fluticasone furoate-vilanterol (BREO  ELLIPTA) 100-25 MCG/INH AEPB, Inhale 1 puff into the lungs daily., Disp: 60 each, Rfl: 5   gabapentin (NEURONTIN) 300 MG capsule, Take 300 mg by mouth at bedtime., Disp: , Rfl:    lactulose (CEPHULAC) 10 g packet, Take 10 g by mouth daily as needed., Disp: , Rfl:    losartan (COZAAR) 25 MG tablet, Take 50 mg by mouth daily., Disp: , Rfl:    methylPREDNISolone (MEDROL) 4  MG tablet, Take 1 tablet (4 mg total) by mouth every other day. Alternate with the 58m dose (Patient taking differently: Take 4 mg by mouth daily.), Disp:  , Rfl:    mometasone-formoterol (DULERA) 100-5 MCG/ACT AERO, Inhale 2 puffs into the lungs 2 (two) times daily. (Patient taking differently: Inhale 2 puffs into the lungs 2 (two) times daily. Patient uses as needed.), Disp: 39 g, Rfl: 0   Nintedanib (OFEV) 100 MG CAPS, Take 1 capsule (100 mg total) by mouth 2 (two) times daily., Disp: 180 capsule, Rfl: 1   ondansetron (ZOFRAN) 4 MG tablet, Take 1 tablet (4 mg total) by mouth every 8 (eight) hours as needed for nausea or vomiting., Disp: 30 tablet, Rfl: 0   pantoprazole (PROTONIX) 40 MG tablet, Take 1 tablet (40 mg total) by mouth 2 (two) times daily before a meal., Disp: 180 tablet, Rfl: 3   polyethylene glycol (MIRALAX / GLYCOLAX) 17 g packet, Take 17 g by mouth as needed., Disp: , Rfl:    pravastatin (PRAVACHOL) 20 MG tablet, TAKE 1 TABLET IN THE P.M., Disp: 90 tablet, Rfl: 3   riTUXimab (RITUXAN) 100 MG/10ML injection, Inject 100 mg into the vein every 6 (six) months. , Disp: , Rfl:    senna (SENOKOT) 8.6 MG TABS tablet, Take 1 tablet by mouth as needed for mild constipation., Disp: , Rfl:    sulfamethoxazole-trimethoprim (BACTRIM DS) 800-160 MG tablet, Take 1 tablet by mouth 3 (three) times a week. Take on Mon, Wed, Fri (Patient taking differently: Take 1 tablet by mouth every Monday, Wednesday, and Friday.), Disp: 12 tablet, Rfl: 5   venlafaxine XR (EFFEXOR-XR) 75 MG 24 hr capsule, Take 75 mg by mouth at bedtime. , Disp: , Rfl:        Objective:   Vitals:   04/08/21 1431  BP: 120/66  Pulse: (!) 51  Temp: 97.9 F (36.6 C)  TempSrc: Oral  SpO2: 97%  Weight: 132 lb 12.8 oz (60.2 kg)  Height: '5\' 6"'  (1.676 m)    Estimated body mass index is 21.43 kg/m as calculated from the following:   Height as of this encounter: '5\' 6"'  (1.676 m).   Weight as of this encounter: 132 lb 12.8 oz (60.2 kg).  '@WEIGHTCHANGE' @  [facility]  04/08/21 1431  Weight: 132 lb 12.8 oz (60.2 kg)     Physical Exam  General: No distress. Looks well Neuro: Alert and Oriented x 3. GCS 15. Speech normal Psych: Pleasant Resp:  Barrel Chest - no.  Wheeze - no, Crackles - yes with squeaks as before, No overt respiratory distress CVS: Normal heart sounds. 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 PEERL +. No post nasal drip        Assessment:       ICD-10-CM   1. Interstitial lung disease due to connective tissue disease ([place]  J84.89    M35.9     2. Drug-induced nausea and vomiting  R11.2    T50.905A     3. Drug-induced weight loss  R63.4    T50.905A     4. Asthma, unspecified asthma severity, unspecified whether complicated, unspecified whether persistent  J45.909     5. Vaccine counseling  Z71.85          Plan:     Patient Instructions    ASthma stable  Interstitial lung disease itself   stable v slightly progressive over time  WEight loss from ofev  full  dose resumed  in Aug 2022 along with other GI side efects esp diarrhea but resolved at lower dose in Sept 2022   Plan - continue ofev 162m twice daily - take zofran 465mtwice daily as needed with ofev  - If Ofev does not work out can try EsEcolabs next step or cellcept/immuran -Continue Rituxan and steroids through the rheumatologist - continuie  breo 100 dosing daily once with albuterol as needed  - cotninue singulaire - hold off starting o2 per your wishes at last visit  - hold off discussion on  right heart cath for now - hold off covid mRNA bivalent omicron/BA.5 booster thrugh Jan 2023 because you had 2nd covid June 2022 and [id number]- certainly mask and reduce clustering esp during holidays to avoid all respiratory viruses    Follow-up - 12 weeks do LFT - 12 weeks do spiro/dlco  - return 30 min slot with Dr RaChase Caller- ILD questionnaire and simple walk test at followup    SIGNATURE    Dr. MuBrand MalesM.D., F.C.C.P,  Pulmonary and Critical Care Medicine Staff Physician, CoCummingirector - Interstitial Lung Disease  Program  Pulmonary [id number]: [phone] no answer or between  15:00h - 7:00h: call [phone] Telephone: [phone]  3:15 PM 04/08/2021

## 2021-04-08 NOTE — Addendum Note (Signed)
Addended by: Lorretta Harp on: 04/08/2021 03:25 PM   Modules accepted: Orders

## 2021-04-18 DIAGNOSIS — M0579 Rheumatoid arthritis with rheumatoid factor of multiple sites without organ or systems involvement: Secondary | ICD-10-CM | POA: Diagnosis not present

## 2021-04-18 DIAGNOSIS — J849 Interstitial pulmonary disease, unspecified: Secondary | ICD-10-CM | POA: Diagnosis not present

## 2021-04-18 DIAGNOSIS — Z7960 Long term (current) use of unspecified immunomodulators and immunosuppressants: Secondary | ICD-10-CM | POA: Diagnosis not present

## 2021-04-21 DIAGNOSIS — H532 Diplopia: Secondary | ICD-10-CM | POA: Diagnosis not present

## 2021-04-21 DIAGNOSIS — H5213 Myopia, bilateral: Secondary | ICD-10-CM | POA: Diagnosis not present

## 2021-04-21 DIAGNOSIS — H52203 Unspecified astigmatism, bilateral: Secondary | ICD-10-CM | POA: Diagnosis not present

## 2021-04-21 DIAGNOSIS — Z961 Presence of intraocular lens: Secondary | ICD-10-CM | POA: Diagnosis not present

## 2021-04-21 DIAGNOSIS — H524 Presbyopia: Secondary | ICD-10-CM | POA: Diagnosis not present

## 2021-05-05 ENCOUNTER — Other Ambulatory Visit: Payer: Self-pay | Admitting: Internal Medicine

## 2021-05-16 DIAGNOSIS — M05711 Rheumatoid arthritis with rheumatoid factor of right shoulder without organ or systems involvement: Secondary | ICD-10-CM | POA: Diagnosis not present

## 2021-05-16 DIAGNOSIS — R2681 Unsteadiness on feet: Secondary | ICD-10-CM | POA: Diagnosis not present

## 2021-05-16 DIAGNOSIS — Z23 Encounter for immunization: Secondary | ICD-10-CM | POA: Diagnosis not present

## 2021-05-16 DIAGNOSIS — Z7951 Long term (current) use of inhaled steroids: Secondary | ICD-10-CM | POA: Diagnosis not present

## 2021-05-16 DIAGNOSIS — R6889 Other general symptoms and signs: Secondary | ICD-10-CM | POA: Diagnosis not present

## 2021-05-16 DIAGNOSIS — M810 Age-related osteoporosis without current pathological fracture: Secondary | ICD-10-CM | POA: Diagnosis not present

## 2021-05-16 DIAGNOSIS — M05111 Rheumatoid lung disease with rheumatoid arthritis of right shoulder: Secondary | ICD-10-CM | POA: Diagnosis not present

## 2021-05-16 DIAGNOSIS — J849 Interstitial pulmonary disease, unspecified: Secondary | ICD-10-CM | POA: Diagnosis not present

## 2021-05-16 DIAGNOSIS — Z79899 Other long term (current) drug therapy: Secondary | ICD-10-CM | POA: Diagnosis not present

## 2021-06-16 ENCOUNTER — Encounter: Payer: Medicare Other | Admitting: *Deleted

## 2021-06-16 DIAGNOSIS — J849 Interstitial pulmonary disease, unspecified: Secondary | ICD-10-CM

## 2021-06-16 DIAGNOSIS — Z006 Encounter for examination for normal comparison and control in clinical research program: Secondary | ICD-10-CM

## 2021-06-19 NOTE — Research (Signed)
Title: Chronic Fibrosing Interstitial Lung Disease with Progressive Phenotype Prospective Outcomes (ILD-PRO) Registry    Protocol #: IPF-PRO-SUB, Clinical Trials # [id number], Sponsor: [employer]   Protocol Version Amendment 4 dated 12Sep2019  and confirmed current on  Consent Version for today's visit date of  Is [place] IRB Approved Version 16 Jun 2018 Revised 16 Jun 2018   Objectives:  Describe current approaches to diagnosis and treatment of chronic fibrosing ILDs with progressive phenotype  Describe the natural history of chronic fibrosing ILDs with progressive phenotype  Assess quality of life from self-administered participant reported questionnaires for each disease group  Describe participant interactions with the healthcare system, describe treatment practices across multiple institutions for each disease group  Collect biological samples linked to well characterized chronic fibrosing ILDs with progressive phenotype to identify disease biomarkers  Collect data and biological samples that will support future research studies.                                            Key Inclusion Criteria: Willing and able to provide informed consent  Age ? 30 years  Diagnosis of a non-IPF ILD of any duration, including, but not limited to Idiopathic Non-Specific Interstitial Pneumonia (INSIP), Unclassifiable Idiopathic Interstitial Pneumonias (IIPs), Interstitial Pneumonia with Autoimmune Features (IPAF), Autoimmune ILDs such as Rheumatoid Arthritis (RA-ILD) and Systemic Sclerosis (SSC-ILD), Chronic Hypersensitivity Pneumonitis (HP), Sarcoidosis or Exposure-related ILDs such as asbestosis.  Chronic fibrosing ILD defined by reticular abnormality with traction bronchiectasis with or without honeycombing confirmed by chest HRCT scan and/or lung biopsy.  Progressive phenotype as defined by fulfilling at least one of the criteria below of fibrotic changes (progression set point)  within the last 24 months regardless of treatment considered appropriate in individual ILDs:  decline in FVC % predicted (% pred) based on >10% relative decline  decline in FVC % pred based on ? 5 - <10% relative decline in FVC combined with worsening of respiratory symptoms as assessed by the site investigator  decline in FVC % pred based on ? 5 - <10% relative decline in FVC combined with increasing extent of fibrotic changes on chest imaging (HRCT scan) as assessed by the site investigator  decline in DLCO % pred based on ? 10% relative decline  worsening of respiratory symptoms as well as increasing extent of fibrotic changes on chest imaging (HRCT scan) as assessed by the site investigator independent of FVC change.     Key Exclusion Criteria: Malignancy, treated or untreated, other than skin or early stage prostate cancer, within the past 5 years  Currently listed for lung transplantation at the time of enrollment  Currently enrolled in a clinical trial at the time of enrollment in this registry       Clinical Research Coordinator / Research RN note : This visit for Subject [id number] with DOB: [birth year] on 06/16/2021 for the above protocol is Visit/Encounter 3 and is for purpose of research.    Subject expressed continued interest and consent in continuing as a study subject. Subject confirmed that there was no change in contact information (e.g. address, telephone, email). Subject thanked for participation in research and contribution to science.      During this visit on 06/16/2021 , the subject completed the blood work and questionnaires per the above referenced protocol. Please refer to the subject's paper source binder for further details.   Signed by  [place] Assistant [place], [place]

## 2021-06-20 DIAGNOSIS — M810 Age-related osteoporosis without current pathological fracture: Secondary | ICD-10-CM | POA: Diagnosis not present

## 2021-07-02 ENCOUNTER — Other Ambulatory Visit: Payer: Self-pay

## 2021-07-02 ENCOUNTER — Ambulatory Visit (INDEPENDENT_AMBULATORY_CARE_PROVIDER_SITE_OTHER): Payer: Medicare Other | Admitting: Internal Medicine

## 2021-07-02 DIAGNOSIS — J849 Interstitial pulmonary disease, unspecified: Secondary | ICD-10-CM | POA: Diagnosis not present

## 2021-07-02 LAB — PULMONARY FUNCTION TEST
DL/VA % pred: 100 %
DL/VA: 4.04 ml/min/mmHg/L
DLCO cor % pred: 79 %
DLCO cor: 15.35 ml/min/mmHg
DLCO unc % pred: 79 %
DLCO unc: 15.35 ml/min/mmHg
FEF 25-75 Pre: 0.89 L/sec
FEF2575-%Pred-Pre: 63 %
FEV1-%Pred-Pre: 61 %
FEV1-Pre: 1.23 L
FEV1FVC-%Pred-Pre: 98 %
FEV6-%Pred-Pre: 65 %
FEV6-Pre: 1.68 L
FEV6FVC-%Pred-Pre: 105 %
FVC-%Pred-Pre: 62 %
FVC-Pre: 1.69 L
Pre FEV1/FVC ratio: 73 %
Pre FEV6/FVC Ratio: 100 %

## 2021-07-02 NOTE — Progress Notes (Signed)
Spirometry/DLCO performed today. 

## 2021-07-02 NOTE — Patient Instructions (Signed)
Spirometry/DLCO performed today. 

## 2021-07-18 ENCOUNTER — Encounter: Payer: Self-pay | Admitting: Internal Medicine

## 2021-07-18 ENCOUNTER — Other Ambulatory Visit: Payer: Self-pay

## 2021-07-18 ENCOUNTER — Ambulatory Visit (INDEPENDENT_AMBULATORY_CARE_PROVIDER_SITE_OTHER): Payer: Medicare Other | Admitting: Internal Medicine

## 2021-07-18 VITALS — BP 122/70 | HR 78 | Temp 97.5°F | Ht 66.0 in | Wt 133.4 lb

## 2021-07-18 DIAGNOSIS — J45909 Unspecified asthma, uncomplicated: Secondary | ICD-10-CM

## 2021-07-18 DIAGNOSIS — R2689 Other abnormalities of gait and mobility: Secondary | ICD-10-CM

## 2021-07-18 DIAGNOSIS — Z79899 Other long term (current) drug therapy: Secondary | ICD-10-CM | POA: Diagnosis not present

## 2021-07-18 DIAGNOSIS — M359 Systemic involvement of connective tissue, unspecified: Secondary | ICD-10-CM

## 2021-07-18 DIAGNOSIS — J8489 Other specified interstitial pulmonary diseases: Secondary | ICD-10-CM

## 2021-07-18 LAB — HEPATIC FUNCTION PANEL
ALT: 12 U/L (ref 0–35)
AST: 20 U/L (ref 0–37)
Albumin: 3.9 g/dL (ref 3.5–5.2)
Alkaline Phosphatase: 50 U/L (ref 39–117)
Bilirubin, Direct: 0.1 mg/dL (ref 0.0–0.3)
Total Bilirubin: 0.4 mg/dL (ref 0.2–1.2)
Total Protein: 6.5 g/dL (ref 6.0–8.3)

## 2021-07-18 NOTE — Progress Notes (Signed)
Brief patient profile:  81 yowf  never smoker with allergies/inhalers as child outgrew by [employer] then  RA since around 2000  Prednisone  x decades and prev eval by Dr Joya Gaskins around 2004 for sob resolved s maint rx and referred 05/26/2013 by Dr Shelia Media for bronchitis and abn cxr   History of Present Illness  05/26/2013 1st [facility] office visit/ Wert cc June 2014 dx pna  In [place] and remicade stopped and 100% better and placed arencia in September 2014  then abruptly worse first week in November with cough green sputum s nasal symptoms, fever low grade and no cp or cough and completely recovered prior to [place] does not recall abx but issue is why keeps getting sick and abn CT Chest (see below).   Arthritis symptoms well controlled at present on Rx for RA rec Nexium 40 mg Take 30-60 min before first meal of the day and add pepcid 20 mg one at bedtime whenever coughing.     10/19/2017  f/u ov/Wert re:  RA lung dz  Chief Complaint  Patient presents with   Follow-up    Cough is much improved, but has not resolved yet. Cough is non prod. She has not had to use her neb.   Dyspnea:  Not limited by breathing from desired activities  But some doe x steps Cough: daytime > noct dry  Sleep: fine  SABA use:  No saba Medrol 4 mg a/w 82m per day/ ok control of arthritis  rec Start back on gabapentin up to 300 mg each am  in addition to the the two at bedtime  If not better increase the medrol to 8 mg daily until bettter then taper back to where you      01/10/2018  f/u ov/Wert re:  RA  Lung dz Medrol  4 mg  One alternating with a half Chief Complaint  Patient presents with   Follow-up    PFT's done. Her breathing has been gradually worsening since the last visit. She has occ cough- non prod.   Dyspnea gradually worse since last ov:  MMRC1 =  MMRC3 = can't walk 100 yards even at a slow pace at a flat grade s stopping due to sob    Gradually x 3 m / more fatigue / no change in  arthritis  Cough: not an issue rec Protonix 40 mg Take 30-60 min before first meal of the day  GERD diet   01/17/2018 acute extended ov/Wert re: cough on medrol 4 mg  One a/w one half  Chief Complaint  Patient presents with   Acute Visit    started coughing 01/11/18- occ prod with minimal green sputum.  She states also wheezing and having increased SOB.    abruptly worse 01/11/18 with severe 24/7 coughing >>  prod min green mucus esp in am/ assoc with subjective wheeze and did not follow previous contingencies re flutter / saba/ increase medrol and admits she does not rember those written instructions nor how to use the neb provided .  No fever/ comfortable at rest sitting  rec For cough > mucinex dm 1200 mg every 12 hours and cough into the flutter valve as much as possible  Doxycycline 100 mg twice daily x 10 days with glass of water Medrol 47mx 2 now and take 2  daily until cough is better then 1 daily x 5 days and then resume the previous dose  Shortness of breath/  wheezing/ still coughing > albuterol neb every 4 hours as needed      01/20/2018 acute extended ov/Wert re: refractory cough and sob 01/11/18 Chief Complaint  Patient presents with   Acute Visit    she is not feeling better, coughing , very SOB, wheezing  mucus now clear/scant  on doxy/ neb machine not working (tube would not plug into the side s adequate force and she was not capable of applying it due to RA hands. Cough/ wheeze/ sob 24/7 / flutter not helping/ can't lie down at hs   rec While coughing protonix 40 mg Take 30- 60 min before your first and last meals of the day  Shortness of breath/ wheezing/ still coughing > albuterol neb every 4 hours as needed  Depomedrol 120 mg IM and medrol 32 mg daily x 2 days,  then 16 mg x 3 days,  Then 8 mg x 4 days , then resume the 4 mg daily  For severe cough > tylenol 3# one every 4 hours if needed  Go to ER if condition worsens on above plan       Date of admission: 01/22/2018              Date of discharge: 01/27/2018   History of present illness: As per the H and P dictated on admission, " Jazzmen Restivo  is a 81 y.o. female, w Rheumatoid arthritis, ILD Asthma, apparently c/o increase in dyspnea this evening. Dry cough.   Denies fever, chills, cp, palp,  N/v, diarrhea, brbpr, black stool.   Pt notes recently being given steroid injection in office as well as being placed on doxycycline. This might have helped slightly but pt worse  Hospital Course:  Summary of her active problems in the hospital is as following. 1 dyspnea/hypoxemia/ILD Concerned that likely GERD Is causing ILD. Patient with cough.   Patient with complaints of awakening with cough and also with oral intake which is slightly improved since 01/24/2018. assessed by speech therapy and speech therapy raising concern of esophageal component but no signs of aspiration.   2D echo with a EF of 55 to 60% with no wall motion abnormalities, grade 1 diastolic dysfunction.  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 Pulmonary felt that the patient should be on scheduled Reglan. I have placed the patient on scheduled potassium before sleep. Continue steroids on discharge continue Mucinex and Claritin as well as inhalers. Patient will follow-up with pulmonary outpatient   2.  Gastroesophageal reflux disease Continue PPI and H2 blocker.  I changed PPI to [facility].   3.  Rheumatoid arthritis Outpatient follow-up.     4.  Anxiety Continue Effexor.       All other chronic medical condition were stable during the hospitalization.  Patient was ambulatory without any assistance. On the day of the discharge the patient's vitals were stable , and no other acute medical condition were reported by patient. the patient was felt safe to be discharge at home with family.   Consultants: PCCM  Procedures: Echocardiogram       03/21/2018  f/u ov/Wert re:   S/p admit was transiently  better  and downhill since Labor day on medrol 4 mg daily  Last orencia on Sept 4th 2019  Chief Complaint  Patient presents with   Acute Visit    Per patient, she has had a dry cough since July 2019. She has been wheezing as well. Increased fatigue. Body aches. Denies any fever or  chills.   Dyspnea:  MMRC4  = sob if tries to leave home or while getting dressed   Cough: harsh/ hacking mostly dry/ has flutter not using    SABA use: not much better with rx   No obvious day to day or daytime variability or assoc excess/ purulent sputum or mucus plugs or hemoptysis or cp or chest tightness, subjective wheeze or overt sinus or hb symptoms.     Also denies any obvious fluctuation of symptoms with weather or environmental changes or other aggravating or alleviating factors except as outlined above   No unusual exposure hx or h/o childhood pna/ asthma or knowledge of premature birth.   INpatient consult 03/26/18 81 year old with rheumatoid arthoritis.At baseline the patient lives at home with her husband and is independent of ADLs.  Has been on many immune suppressants over decades and curently on orencia x 4 year and prednisone. Does not recollect being on bactrim/dapsone. Chart mentions BOOP/MAI in 2001 but she denies this. 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 Got admitted. Rx with steroids. Per Notes - clnical suspicion of  arytenoid inflmmation related wheeze noticed (she also reports asthma NOS). Follolwup with ENT recommended (but not seen one as yet). She also appears to have passed swallow with rec for regular diet with thin liquids but did to have mild eso stricture and reflux during testing . PFT shows 10% FVC decline for first ime. CT chest at this tme (aug 2019) showed new rLL infiltrate.  ECHO July 2019 without evicence of elevated  PASP and saw cards [facility] June 2019 and was considered to have worsenin dyspnea due to [facility] issues (reports stress test at [facility] that was normal but I cannot see it)   She reports after discharge she got better but in last several weeks has deteriorated with cough and dyspnea. There is new hypoxemia (currently RA with nail polish and poor circulation  - 89% pulse ox) needing 2L [place]. Per [facility] improved with steroids and abx. CTA 03/24/2018 => shows that RLL inifltrate has improved . Other chronic ILD changes + and small  Hiatal hernia + witthout change.   Review of lab work does show eosinophilia at time of admision   EVENTS 03/21/18 - IgE -5, blood allegy panel - negative, 03/23/2018 - - admit . HIGH EOS 2300, ESR 48, BNP 89 , HIV neg 9/12- PCT negative, RVP negative 9/14 -  PCT < 0.1, Urine strep - negative, MRSA PCR - positive. IgE - normal 4, Blood allergy panel repeat - negative 03/26/2018 - leading consideration for airway (BO in RA +/- asthma) related flare either due to MRSA bronchitis or clinical suspicion of arytenoid inflmmation +/- GERD relatd flare (she has small hiatal hernia)  +/- ? Dysphagia  up causing mild hypoxemia acute resp failure, wheeze . Allergy and IgE blood work negative thought. Patient reports being better but says she is choking on drinkin water [facility] MD says wheeze improved significantly with steroids.  RN says was down to RA yesterday evening but needed 1L  at sleep. Today -Room air at rest 94% and desaturated to 86% walking 60 feet 03/27/2018  - better. Off o2 at rest. STill coughs with water and when lies down.  Husband at bedside. Both requesting ILD clinic followup . Desaturated t 79% walking 90 feet.  OV 04/12/2018  Subjective:  Patient ID: Stacey [name], female , DOB: Aug 28, 1940 , age 67 y.o. , MRN: [id number] , ADDRESS: [place] [id number]   04/12/2018 -   Chief Complaint  Patient presents with   Consult    Pt is a former MW pt.  Pt  denies any current complaints of cough, SOB, or CP but states the cough she originally had ended her up in the hosp 9/11-9/17 with dx acute respiratory failure. Pt does wear 2pulse with exertion and also wears 2L continuous when at home.     HPI DARLYNN RICCO 81 y.o. -presents for follow-up to the ILD clinic.  She is known to have rheumatoid arthritis with ILD changes.  She had been followed by Dr. Christinia Gully.  However in July 2019 in September 2019 she has had 2 admissions to the hospital with respiratory distress and hypoxemic respiratory failure.  In the first 1 that seem to be right lower lobe infiltrate and then she improved from it but in the second 1 even though the right lower lobe infiltrates were better she still was hypoxemic.  Acid reflux and dysphagia was considered a possible etiology but she passed swallow study 2 times.  They thought she had some reflux.  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  At the same time it is not clear if the ILD is progressive based on pulmonary function testing below   At this point in time she tells me that she is getting home physical therapy.  Her fatigue is improving but it is not fully resolved.  She was discharged on continuous oxygen which she is using.  However she is feeling less short of breath.  Today in fact when we turned her oxygen off and walked her she did not desaturate and this is a significant improvement.  She is on monthly Orencia through the [facility] rheumatologist Dr. Eda Paschal.  At this point in time she is put the Orencia on hold.  She told me that she is been on Orencia for 4 years and never had a respiratory exacerbation still recently x 2.  Although before going on Orencia she had pneumonia while on Remicade and the Remicade.  In terms of her rheumatoid arthritis she hardly has any pain.  Her joint architecture is fairly well-preserved because of various immunomodulators over  time.  She says that she was on Remicade for years and when she stopped it for 8 weeks before the switch to [place] she never really had a relapse in her rheumatoid arthritis.  She is largely pain and stiffness free.  She believes she can go without  her Orencia for a while.  Review of the literature shows greater than 10% chance of a respiratory infection especially COPD exacerbation.  Although the time frame for this is unclear.       OV 06/07/2018  Subjective:  Patient ID: Stacey [name], female , DOB: 03/18/1941 , age 75 y.o. , MRN: [id number] , ADDRESS: [place] [address]   06/07/2018 -   Chief Complaint  Patient presents with   Follow-up    ILD, PFT done today, some wheezing and coughing but better tha before   Rheumatoid arthritis ILD and asthma/obstructive lung disease phenotype on Dulera  HPI JOELYNN DUST 81 y.o. -presents for routine follow-up with her husband.  She is here to follow-up with [facility] Dr. Stann Mainland.  She plans to  do this in December 2019.  She continues to be off Orencia.  Her joints are slowly getting stiff again.  She believes that she will need to be back on immunosuppression agent again.  She currently continues Medrol 4 mg alternating with 2 mg.  This for her rheumatoid arthritis.  In terms of her joints she continues on Medrol 4 mg alternating with 2 mg but not on any other immunosuppression agent.  Overall she is been stable but for the last 2 weeks has had green sputum and wheezing and chest congestion and cough.  She recently visited her husband who was hospitalized and walking the long hallways at [facility] made a short of breath but she thinks this is probably baseline for her.  There are no other new issues.  She did have spirometry and DLCO and this shows a decline compared to September 2019 and a similar to July 2019.  It is documented below.  This is probably reflective of a flareup   OV  07/21/2018  Subjective:  Patient ID: Stacey [name], female , DOB: 10-26-1940 , age 5 y.o. , MRN: [id number] , ADDRESS: [place] [address]   07/21/2018 -   Chief Complaint  Patient presents with   Follow-up    Pt states she has been doing well since last visit. States she is about to begin Rituxan with [facility]. Pt still becomes SOB with exertion. Denies any complaints of cough or CP.   Rheumatoid arthritis ILD and asthma/obstructive lung disease phenotype on Dulera  HPI TIFFIANY BEADLES 81 y.o. -presents for follow-up of the above.  Last seen just before Thanksgiving 2019.  In the interim overall stable although on June 23, 2018 she climbs a steep flight of stairs which is unusual exertion for her and she became very dyspneic.  Following day saw Dr. Stann Mainland at [facility] rheumatology and was given Z-Pak and prednisone and started feeling better.  Although it is not fully clear to me she had fever and bronchitic symptoms.  I reviewed Dr. Stann Mainland note.  Dr. Stann Mainland is decided to start Rituxan for rheumatoid arthritis.  She is only having some minimal joint pain at this point.  She is off Orencia and continues to be off [place].  She did have some blood work with [place] before starting Rituxan.  She is due to see Dr. Stann Mainland within the next week and start her Rituxan.  Her liver function test July 18, 2017 is normal hemoglobin is normal.  CRP is also normal.  We did spirometry and walking desaturation test.  These show improvement compared to before and these are documented below.  Currently she not using nighttime or daytime oxygen.  She is wondering if she could switch rheumatology care to [facility].  This is because while she likes [employer] she is getting older and more frail and feels some body local would be of help.  I have sent a message to Dr. Estanislado Pandy inquiring.  Certainly we can help her with Rituxan infusions at [facility] if needed.  She  will check on this with her [facility] rheumatologist.       OV 03/27/2019  Subjective:  Patient ID: Stacey [name], female , DOB: [birth year] , age 28 y.o. , MRN: [id number] , ADDRESS: [place] [address]  Rheumatoid arthritis ILD and asthma/obstructive lung disease phenotype on [facility]   03/27/2019 -  Rourine fu   HPI ELLORIE KINDALL 81  y.o. -presents for the above.  Last seen in January 2020.  After that she has seen Dr. Stann Mainland rheumatology at [facility].  She is now getting Rituxan 2 doses every 6 months.  She says this is helped her joints and her stiffness.  She is a little bit more mobile than usual.  However in terms of her respiratory status she continues to have episodic cough.  In June 2020 she again got hypoxemic and got admitted.  Since then she has had episodic cough.  She had a respiratory exacerbation in June 2020 for the admission she got steroids.  This seemed to help.  She is also on a higher dose Dulera right now.  In terms of her cough this seems to be her biggest problem.  She seems to be on Dulera, Singulair scheduled with also Tessalon and Delsym and DuoNeb.  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  Her last high-resolution CT scan of the chest was 1 year ago.  She says the dyspnea itself is not worse.  She is currently not using oxygen.  On exam she did have some wheezing.  Currently she has white and brown sputum but this is baseline.  In terms of a COVID wrist she has been tested recently couple of times and this is been negative.  She is isolating well.  She wanted to know COVID prevention activities and risk status and masking strategies.      OV 05/24/2019  Subjective:  Patient ID: Stacey [name], female , DOB: 03-Jan-1941 , age 73 y.o. , MRN: [id number] , ADDRESS: [place] [address]   05/24/2019 -   Chief  Complaint  Patient presents with   Follow-up    Pt was recently in the hosp due to ILD. Pt states that she has been better since being out of the hosp.   Rheumatoid arthritis ILD and asthma/obstructive lung disease phenotype on Dulera/Singulair  Post herpetic neuralgia - on elavil, gabapentin, effexor  HPI DESTINAE NEUBECKER 81 y.o. -returns for follow-up.  At the last visit approximately 2 months ago she was reporting worsening cough following an admission in summer 2020.  Her pulmonary function test suggested worsening ILD status.  Therefore we requested a high-resolution CT chest which she did in October 2020.  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  However in the interim after the CT scan was done towards the end of October 2020 she developed worsening of her cough over 2 weeks and also associated shortness of breath but significantly the cough is much worse.  She ended up getting admitted to the hospital.  There was some hypoxemia.  By this time she had finished a ENT evaluation that did not show any involvement of the arytenoids.  Pulmonary was consulted.  She was given a prednisone burst which she just finished I believe yesterday.  She is back on her baseline Medrol.  And she is feeling better.  Her oxygen status is improved although she is using oxygen at night now.  She is really frustrated with these recurrent flareups and these admissions which ended up with her having a wheeze and also hypoxemia.  Currently she is on her baseline Medrol for rheumatoid arthritis associated with Dulera and Singulair.  She is also on losartan for blood pressure.  Her walking desaturation test is slightly  worse than baseline.  She has a GI consult pending because of the recurrent episodes of cough and flareups.  We went over exposure history.  We used interstitial lung disease questionnaire for the exposure history.  Specifically she denies any electronic cigarette use of  marijuana use of cocaine use or any IV drug abuse.  [place] last 14 years.  Asked extensive questions about the home environment it is positive for nebulizer use but the nebulizer does not have mildew or mold in it.  Otherwise no organic antigen exposure.  The house is not damp.  There is no mold or mildew in the shower curtain.  There is no humidifier use no steam iron use.  No Jacuzzi use.  No misting [place] outside to inside the house.  No pet birds.  No pet gerbils no feather pillows.  There is no mold in the [facility] duct.  She does not do any gardening.  Does not use wind instruments.  Also 122 question occupational history elicited and essentially negative.  The other issue is that she has polypharmacy.  She is asking for my help in reducing her medications.  She is on 3 medications for postherpetic neuralgia.  She is on losartan      OV 06/21/2019  Subjective:  Patient ID: Stacey [name], female , DOB: 1940-09-29 , age 45 y.o. , MRN: [id number] , ADDRESS: [place] [address]   06/21/2019 -   Chief Complaint  Patient presents with   televisit    hosp 11/29-12/4 due to covid with pna. pt said that she is doing okay after recent hosp but states she has no energy.      COLLEN HOSTLER 81 y.o. - last visit 05/24/2019. Diagnosed with covid-19 on 05/31/2019. Admitted 06/11/2019  - 06/16/2019. Quarantine ends 06/21/2019 today. Rx with Remdesviri and Decadron. Husband also on phone. Questions  1. Quarantine ends - from 06/22/19_0 and she is not contagious and likely resistant to reinfection to covid for another few months  2. She is on 10 day dexamethasone for covid - today is last day for it  3. She is on dulera. Hospital gave combivent and she does not want do combivent - this is fine  4. Ofev for ILD - not started it yet . She had questions about side effects. Explained GI and LFT monirtong. SHe wanted to  wait till Jan 2021 and start it . I am ok with that.   5. Small hiat   IMPRESSION: HRCT OCt 2020 1. 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 Findings have progressed since 2017 and 2019 high-resolution chest CT studies. 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 Findings are consistent with UIP per consensus guidelines: Diagnosis of Idiopathic Pulmonary Fibrosis: An Official ATS/ERS/JRS/ALAT Clinical Practice Guideline. [place], Iss 5, [phone], Mar 13 2017. 2. 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 4. Small hiatal hernia.   Aortic Atherosclerosis (ICD10-I70.0).     Electronically Signed   By: Ilona Sorrel M.D.   On: 04/24/2019 13:29    OV 08/03/2019 - face to face visit  Subjective:  Patient ID: Stacey [name], female , DOB: 1940/11/30 , age 89 y.o. , MRN: [id number] , ADDRESS: [place] [address]    Rheumatoid arthritis with  Joint s/ILD- Rituxan/steroids ([facility])  - progressive phenotype (CT 2017/2019 -> Oct 2020). Planned Ofev start in Nov 2020 but did not start as of dec 2020. LFT normal Jan 2021  [place]/obstructive lung disease phenotype on Dulera/Singulair  Post herpetic neuralgia - on elavil, gabapentin, effexor  Coronary Artery Calcification - 1 vessel - cardiology referral done  Diagnosed with covid-19 on 05/31/2019. Admitted 06/11/2019  - 06/16/2019. Rx with Remdesviri and Decadron.  Small Hiatal hernia onCT Oct 2020  Rib fracture from fall Dec 2020: Musculoskeletal: There is nondisplaced fractures of the posterior left fifth, 6, 8, 9, and tenth left ribs. There is unchanged slight superior compression deformities of the T5, T6, T10 vertebral bodies with less than 25% loss in height.Associated pulmnary contusion  +    HPI ABRIELLE FINCK 81 y.o. -presents for a visit. Her nintedanib has been delayed because of COVID-19. Also subsequently a fall. She saw a nurse practitioner on July 20, 2019 and they took a shared decision to start nintedanib. She had a ? telephone visit with Dr. Candie Mile the rheumatologist at [facility]. Patient scheduled for her next Rituxan in February 2021 but reviewed the notes indicates this could be pushed to early spring 2021. Dr. Stann Mainland is okay with the patient study got intubated at this point in time.  Patient has follow-up with Dr. Stann Mainland tomorrow at [facility].  At this face-to-face visit she is here with her husband.  She says she is much better after the Covid and also the fall that fractured her ribs.  Nevertheless all this is left her fatigue.  Infective fatigue scores are much worse.  Also in the last 2 weeks has had increased cough wheezing and shortness of breath.  The sputum was also changed color in the last 1 week to clear green.  This is all new.  Her symptom score is therefore a worse than her baseline.  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  She is only taking 100 mg once a day.  The plan was to go up to 100 mg twice a day next week.  This is a minimum effective dose.  The maximum dose is 150 mg twice a day.  Given her age and comorbidities we are starting at a low dose.  Part of this visit is to make sure that she is tolerating the drug fine.  And so far she is.  She is interested in the ILD-pro registry study done by the [place].   She is working with physical therapy for her fatigue.  Ambulatory Walk 07/20/2019 2 Lap- O2 92% RA; HR 109 No shortness of breath She did not need to stop Used walker    OV 09/20/2019  Subjective:  Patient ID: Stacey [name], female , DOB: April 23, 1941 , age 67 y.o. , MRN: [id number] , ADDRESS: [place] [address]   09/20/2019 -   Chief Complaint  Patient presents with   Televisit    Called and spoke with pt who stated she has been feeling okay since last visit. Pt stated she is still taking OFEV and denies any complaints. Pt states she believes her breathing is stable at this point.    Rheumatoid arthritis with Joint s/ILD- Rituxan/steroids ([facility])  - progressive phenotype (CT 2017/2019 -> Oct 2020). Planned Ofev start in Nov 2020 but did not start as of dec  2020. 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 Admitted 06/11/2019  - 06/16/2019. Rx with Remdesviri and Decadron.  Small Hiatal hernia on CT Oct 2020  Rib fracture from fall Dec 2020: Musculoskeletal: There is nondisplaced fractures of the posterior left fifth, 6, 8, 9, and tenth left ribs. There is unchanged slight superior compression deformities of the T5, T6, T10 vertebral bodies with less than 25% loss in height.Associated pulmnary contusion +   HPI MaKRINA [id number]. - has 2nd covid vaccine later this week. Rituxan is end of the month. Baseline RA regiment is On dulera for associated obstructio of lung. Continue ofev 10040mid since mid jan 2021 for ILD. No side effects. Did see BetDerl Barrowr face to face visit early feb 2021 -> for bronchitis and felt better after prdnisone and zpak. Currently on medrol   8 mg day and staying there per BetDerl BarrowWants t oknow if she can taper. Wants to know how she can prevent care flare ups. Explained ofev, masking and social distancing  prevent respiratory infection.Denies choking on food or aspirating. Denies mold in house - relatively news. Denies dog. Denies cat. Denies carious teeth. Denies post nasal drip  OVerll feels better and improved dyspnea.     OV 12/18/2019  Subjective:  Patient ID: MarTamala Fothergillemale , DOB: [birth year]age 40 83o. , MRN: [id number]: [id number] [id number] RusShon BatonD Rheumatologist-Dr. [facility]da [facility]chal [facility]: Dr. RamVeryl Speak/01/2020 -   Chief Complaint  Patient presents with   Follow-up    no worse   Rheumatoid arthritis with Joint s/ILD- Rituxan/steroids ([facility])  - progressive phenotype (CT 2017/2019 -> Oct 2020). Planned Ofev start in Nov 2020 but did not start as of dec 2020. 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 Admitted 06/11/2019  - 06/16/2019. Rx with Remdesviri and Decadron.  Small Hiatal hernia on CT Oct 2020  Rib fracture from fall Dec 2020: Musculoskeletal: There is nondisplaced fractures of the posterior left fifth, 6, 8, 9, and tenth left ribs. There is unchanged slight superior compression deformities of the T5, T6, T10 vertebral bodies with less than 25% loss in height.Associated pulmnary contusion +  -On Reclast once a year through Dr. JennifEda Paschal MargarTamala Fothergillo27-returns for follow-up of her ILD.  It has been a few month since I last saw her.  In this time she did not taper her Medrol down.   She stated 8  mg/day.  She was originally taking the Medrol for her rheumatoid arthritis for many years.  She recently increased the dose from 4 mg / 2 mg every other day to 8 mg daily because of recurrent respiratory exacerbations.  She is asking for [facility] refills for obstructive lung disease.  She is getting Rituxan through Dr. Eda Paschal for rheumatoid arthritis.  Her next Rituxan is in September 2021.  She saw Dr. Stann Mainland in April 2021 and reviewed the note.  Her liver function test at the time was fine.  In terms of her ILD she continues on nintedanib 100 mg twice daily.  She says she has no side effects from the drug she is tolerating it quite well.  We discussed about increasing the dose but she wants to hold off because she just got a new supply.  However she is open to increasing the dose when the supply runs out.  She is due for liver function test today.  In terms of overall symptoms things have improved as can be seen below and the symptom score.Marland Kitchen  However her main concern is that of fatigue.  She is open to attending pulmonary rehabilitation.  She is not using oxygen at home.  Sometime back when we tested overnight oxygen desaturation test she did not desaturate.  Walking desaturation test today stable.  She does notice of note that her blood pressure has gone up.  She is working with her primary care physician on this.  She is on losartan.    OV 04/10/2020   Subjective:  Patient ID: Stacey [name], female , DOB: 10/24/1940, age 86 y.o. years. , MRN: [id number],  ADDRESS: [address] [place] [address] PCP  Shon Baton, MD Providers : Treatment Team:  Attending Provider: Brand Males, MD   Chief Complaint  Patient presents with   Follow-up    CT scan 8/23, has not increased Ofev, shortness of breath with exertion.    Rheumatoid arthritis with Joint s/ILD- Rituxan/steroids ([facility] u[facility])  - progressive phenotype (CT 2017/2019 -> Oct 2020). Planned Ofev  start in Nov 2020 but did not start as of dec 2020. 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 Admitted 06/11/2019  - 06/16/2019. Rx with Remdesviri and Decadron.  Admitted August 2021 for respiratory infection not otherwise specified.  [place]  Small Hiatal hernia on CT Oct 2020  Rib fracture from fall Dec 2020: Musculoskeletal: There is nondisplaced fractures of the posterior left fifth, 6, 8, 9, and tenth left ribs. There is unchanged slight superior compression deformities of the T5, T6, T10 vertebral bodies with less than 25% loss in height.Associated pulmnary contusion +  -On Reclast once a year through Dr. JenEda Paschal HPI MarTamala [name] 60o. -returns for follow-up.  Last week by myself early June 2021.  Then after that in early August 2020 when she got admitted for respiratory infection not otherwise specified.  She  was hypoxemic on room air.  This followed a trip to [place].  In the follow-up phase end of August 2020 when she had high-resolution CT chest that showed increased groundglass opacities but when compared to a CT from 8 months ago.  Also chronic ILD had worsened.  I personally visualized the image at this point in time she is improved from this admission she is not using oxygen at all.  In fact walking desaturation test shows near baseline.  Nevertheless overall her symptom scores have declined over time ILD symptom score is listed  below.  She is really concerned about several issues.  In terms of her ILD she was inquiring about escalating to 150 mg twice daily.  Even at 100 mg twice daily she is significantly symptomatic in terms of nonrespiratory issues such as nausea and dizziness and fatigue.  I did caution her that these could all get worse.  Therefore she is just opted to be at 100 mg twice daily  -Recurrent admissions for respiratory issues [I did address that some of this was Covid and fall related and not necessarily her usual summer exacerbation].  Nevertheless, I asked about exposures at home.  She does not have a feather blanket or feather pillow or feather jacket.  She denies any mold or bird exposure or mildew exposure at home.  Does not do any gardening or wind instruments.  She is immunosuppressed and I did notice that she is not on Bactrim for PCP prophylaxis.  In 2019 when we checked G6PD the lab could not process this lab.  She is interested in Bactrim prophylaxis.  She is interested in okay getting G6PD rechecked   -She is dealing with significant amount of dizziness.  Neurology is sending her to neuro rehab.  It is believed it is multifactorial.  I reviewed her recent CT head angio report.  I personally visualized the image   -She is also worried about cough.  Currently cough is tolerable but she says prior to her exacerbations cough gets worse.  We did discuss with her that if she is aspirating which she denied.  We did discuss about the possibility of starting Bactrim and monitoring her cough and she is fine with that.  I did review old records Lungs/Pleura: Biapical pleuroparenchymal scarring. Patchy and largely peripheral peribronchovascular ground-glass, increased from 07/03/2019. Underlying subpleural reticulation, traction bronchiectasis/bronchiolectasis and scattered honeycombing, similar to minimally increased from 07/03/2019. Calcified granulomas. No pleural fluid. Airway is unremarkable. No air  trapping.   Upper Abdomen: Visualized portions of the liver, adrenal glands, kidneys, spleen, pancreas, stomach and bowel are unremarkable with the exception of a small hiatal hernia. Cholecystectomy. Calcified upper abdominal lymph nodes.   Musculoskeletal: Degenerative changes in the spine. No worrisome lytic or sclerotic lesions.   IMPRESSION: 1. Increased patchy pulmonary parenchymal ground-glass may be due to an atypical/viral pneumonia, including due to COVID-19. 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 2. Aortic atherosclerosis (ICD10-I70.0). Coronary artery calcification.     Electronically Signed   By: Lorin Picket M.D.   On: 03/04/2020 12:58    OV 08/01/2020  Subjective:  Patient ID: Stacey [name], female , DOB: 10-01-1940 , age 83 y.o. , MRN: [id number] , ADDRESS: [address] [place] [address] PCP Shon Baton, MD Patient Care Team: Shon Baton, MD as PCP - General (Internal Medicine) Buford Dresser, MD as PCP -  Cardiology (Cardiology)  This Provider for this visit: Treatment Team:  Attending Provider: Brand Males, MD   Rheumatoid arthritis with Joint s/ILD- Rituxan/steroids ([facility])  - progressive phenotype (CT 2017/2019 -> Oct 2020). Planned Ofev start in Nov 2020 but did not start as of dec 2020. 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 Admitted 06/11/2019  - 06/16/2019. Rx with Remdesviri and Decadron.  Admitted August 2021 for respiratory infection not otherwise specified.  [place]  Small Hiatal hernia on CT Oct 2020  Rib fracture from fall Dec 2020: Musculoskeletal: There is nondisplaced fractures of the posterior left fifth, 6, 8, 9, and tenth left ribs. There is unchanged slight superior compression deformities of the T5, T6, T10 vertebral bodies with less than 25% loss in height.Associated pulmnary contusion +  -On Reclast once a year through Dr. JenEda Paschal 08/01/2020 -   Chief Complaint  Patient presents with   Follow-up    Doing well     HPI MarBLAIR MESINA 76o. -returns for follow-up.  She says since last visit she has lost weight.  In fact tracking her weight it appears she is definitely lost weight.  She has occasional intermittent nausea once every few weeks this is because she does not time her nintedanib well with food.  She has a low appetite as well.  She tried to go up on the Internet but she is unable to.  She takes a low-dose 100 mg twice daily.  She is on Rituxan and prednisone.  She is up-to-date with her COVID-vaccine.  Current shortness of breath standpoint she is stable.  Symptoms are stable.  CT Chest data  OV 10/22/2020  Subjective:  Patient ID: MarTamala Fothergillemale , DOB: [birth year]age [age]o. , MRN: [id number]: [address] [id number] RusShon BatonD Patient Care Team: RusShon BatonD as PCP - General (Internal Medicine) ChrBuford DresserD as PCP - Cardiology (Cardiology)  This Provider for this visit: Treatment Team:  Attending Provider: RamBrand MalesD    10/22/2020 -   Chief Complaint  Patient presents with   Follow-up    Still having shortness of breath with activity, denies  increase in severity.     Rheumatoid arthritis with Joint s/ILD- Rituxan/steroids ([facility] uinviesty)  - progressive phenotype (CT 2017/2019 -> Oct 2020). Planned Ofev start in Nov 2020 but did not start as of dec 2020. 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 Admitted 06/11/2019  - 06/16/2019. Rx with Remdesviri and Decadron.  - sp evushed x 2  Admitted August 2021 for respiratory infection not otherwise specified.  Following trip to [place].  Was hypoxemic.  Small Hiatal hernia on CT Oct 2020  Rib fracture from fall Dec 2020: Musculoskeletal: There is nondisplaced fractures of the posterior left fifth, 6, 8, 9, and tenth left ribs. There is unchanged slight superior compression deformities of the T5, T6, T10 vertebral bodies with less than 25% loss in height.Associated pulmnary contusion +  -On Reclast once a year through Dr. Eda Paschal  Normal ECHO Summer 2020   HPI CAITRIONA SUNDQUIST 81 y.o. -returns for follow-up.  At this point in time for her ILD she is taking nintedanib 100 mg twice daily.  Last liver function test was in December 2021.  She is now attending pulmonary rehabilitation.  She is not sure it is helping her.  She tells me that she does not desaturate at pulmonary rehabilitation so she not using her oxygen.  Review of the records indicate same but it appears that today her dyspnea is worse in terms of symptom score although she is denying that.  Walking desaturation  test in the office today with a forehead probe suggest that pulse ox is declined.  Her pulmonary function test also shows 5% FVC decline.  She was surprised by this.  In talking to her realize that for coexistent obstructive lung disease she is no longer taking Dulera.  She is willing to take inhaler which she will have to twist instead of having to pump with her fingers because she has rheumatoid arthritis.  She is no longer losing weight though.  She had questions about taking monoclonal antibody prophylaxis.  She is under the impression she needs to take it every 6 months with Rituxan dosing.  I did explain to her that in the presence of Rituxan the body is not able to respond actively to vaccine and that is why she is having prophylactic antibody.  She is now had 2 doses the last 1 being a few weeks ago first 1 was in January 2022.  I have written to the [facility] coordinator Wilber Bihari 27 monoclonal antibody prophylaxis schedule.  Expressed to patient that because of monoclonal antibody prophylaxis she did not do the vaccine.  Last echocardiogram was in summer 2020.      OV 12/23/2020  Subjective:  Patient ID: Stacey [name], female , DOB: 1940/10/19 , age 68 y.o. , MRN: [id number] , ADDRESS: [place] [address] PCP Shon Baton, MD Patient Care Team: Shon Baton, MD as PCP - General (Internal Medicine) Buford Dresser, MD as PCP - Cardiology (Cardiology)  This Provider for this visit: Treatment Team:  Attending Provider: Brand Males, MD   12/23/2020 -   Chief Complaint  Patient presents with   Follow-up    PFT performed today. Pt states she has been doing okay since last visit and denies any complaints.     HPI MOSSIE GILDER 81 y.o. -returns for follow-up.  There is concern for progression.  Last visit we have made sure she added Breo.  She says she feels better.  In fact symptom score is better.  However pulmonary function test shows  continued decline.  She is on the low-dose nintedanib.  She says if she does not take the nintedanib exactly with food  and even if she takes it 15 to 20 minutes later she will have nausea.  Today she had nausea but otherwise if she is diligent she can maintain.  I informed her the pulmonary function test shows continued decline.  She is on Rituxan prednisone and low-dose nintedanib.  She initially said she will not be able to tolerate the higher dose nintedanib but after realizing that her nausea is mild and her weight loss is stable she says she is willing to give the higher dose nintedanib and attend.  I was able to get her some donor sample of nintedanib.  The lot number of this is [id number].  Expiration date is November 2023.  The serial number is [id number].  The GT IN number is [id number]       ECHO 11/3120  IMPRESSIONS     1. Left ventricular ejection fraction, by estimation, is 65 to 70%. The  left ventricle has normal function. The left ventricle has no regional  wall motion abnormalities. Left ventricular diastolic parameters are  consistent with Grade I diastolic  dysfunction (impaired relaxation). Elevated left ventricular end-diastolic  pressure. The E/e' is 6. The average left ventricular global longitudinal  strain is -18.4 %.   2. Right ventricular systolic function is normal. The right ventricular  size is normal. There is mildly elevated pulmonary artery systolic  pressure. The estimated right ventricular systolic pressure is 90.3 mmHg.   3. The mitral valve is abnormal. Mild mitral valve regurgitation.   4. The aortic valve is tricuspid. Aortic valve regurgitation is not  visualized. Mild aortic valve sclerosis is present, with no evidence of  aortic valve stenosis.   5. The inferior vena cava is normal in size with greater than 50%  respiratory variability, suggesting right atrial pressure of 3 mmHg.   Comparison(s): Changes from prior study are noted. 12/18/18  EF 60-65%. PA  pressure 65mHg.    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  OV 03/10/2021  Subjective:  Patient ID: MTamala [name] female , DOB: 6Nov 01, 1942, age 81y.o. , MRN: [id number], [address] [address] [address]PC[address] RShon Baton MD Patient Care Team: RShon Baton MD as PCP - General (Internal Medicine) CBuford Dresser MD as PCP - Cardiology (Cardiology)  This Provider for this visit: Treatment Team:  Attending Provider: RBrand Males MD    03/10/2021 -   Chief Complaint  Patient presents with   Follow-up    Pt states she has questions about ofev she currently on it but having side effects.    Rheumatoid arthritis with Joint s/ILD- Rituxan/steroids ([facility] uinviesty)  - progressive phenotype (CT 2017/2019 -> Oct 2020). Planned Ofev start in Nov 2020 but did not start as of dec 2020. 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 Admitted 06/11/2019  - 06/16/2019. Rx with Remdesviri and Decadron.  - sp evushed x 2  - 2nd covid by hx June 2022 - evusheld repeat Oct 2022  Admitted August  2021 for respiratory infection not otherwise specified.  Following trip to [place].  Was hypoxemic.  Small Hiatal hernia on CT Oct 2020  Rib fracture from fall Dec 2020: Musculoskeletal: There is nondisplaced fractures of the posterior left fifth, 6, 8, 9, and tenth left ribs. There is unchanged slight superior compression deformities of the T5, T6, T10 vertebral bodies with less than 25% loss in height.Associated pulmnary contusion +  -On Reclast once a year through Dr. 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  -returns for follow-up.  She presents with her husband Festus Aloe.  Last visit she wanted to increase the nintedanib to 150 mg twice daily but this is made her have more nausea and vomiting.  The diarrhea is very mild.  She then reduced her nintedanib 200 mg twice daily but still having side effects.  She is now started losing weight again.  She is really frustrated by this.  Her husband asked about pirfenidone.  Explained that the research with progressive pulmonary fibrosis is limited in the case of pirfenidone so nintedanib is the first choice.  Did agree that if nintedanib did not work we would try pirfenidone.  She wanted know what her options were.  We discussed about stopping nintedanib and improving quality of life but face the risk of continued progression pulmonary fibrosis.  She was not that enthused about this choice.  We discussed about trying Zofran with the nintedanib.  She seemed more receptive to this idea.  We decided that we will try with 100 mg twice daily at the lower dose and then see.  She believes asthma is under control.  Her pulmonary function test shown below seems to fluctuate.  Overall symptom severity stable.  She wanted a handicap placard signed today which I did.       OV 04/08/2021  Subjective:  Patient ID: Stacey [name], female , DOB: 1940/12/23 , age 81 y.o. , MRN: [id number] ,  ADDRESS: [place] [address] PCP Shon Baton, MD Patient Care Team: Shon Baton, MD as PCP - General (Internal Medicine) Buford Dresser, MD as PCP - Cardiology (Cardiology)  This Provider for this visit: Treatment Team:  Attending Provider: Brand Males, MD    04/08/2021 -   Chief Complaint  Patient presents with   Follow-up    Pt states she has been doing okay since last visit and denies any real complaints. States breathing has been doing okay and states the nausea went away.    Rheumatoid arthritis with Joint s/ILD- Rituxan/steroids (duke uinviesty)  - progressive phenotype (CT 2017/2019 -> Oct 2020). Planned Ofev start in Nov 2020 but did not start as of dec 2020. LFT normal Jan 2021   - Rituxan via [facility] -September 2021 + Medrol 64m/2mg QOD baseline many decades (on 838mper day since feb 2021 due to recurrent asthma flares)  - nintedanib for her ILD she started it on July 31, 2019 - started 10029mwice daily (started low dose du eto side effect concern) -> increased to 150m104md d June 2022 -> reduced to 100mg24m aug 2022 due  tow eight loss  - ILD PRO registry study - since 08/16/19  Athma/obstructive lung disease phenotype on Dulera/Singulair  Post herpetic neuralgia - on elavil, gabapentin, effexor  Coronary Artery Calcification - 1 vessel -  S/p - covid-19 on 05/31/2019. Admitted 06/11/2019  - 06/16/2019. Rx with Remdesviri and Decadron.  - sp evushed x 2  Admitted August 2021 for respiratory infection not otherwise specified.  Following trip to [place].  Was hypoxemic.  Small Hiatal hernia on CT Oct 2020  Rib fracture from fall Dec 2020: Musculoskeletal: There is nondisplaced fractures of the posterior left fifth, 6, 8, 9, and tenth left ribs. There is unchanged slight superior compression deformities of the T5, T6, T10 vertebral bodies with less than 25% loss in height.Associated pulmnary contusion +  -On Reclast  once a year through Dr. Eda Paschal  Normal ECHO Summer 2020 -> possible [facility] May 2021 RSVP 38s with GR1 ddx  HPI AKELIA HUSTED 81 y.o. 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  Last seen 4 weeks ago.  At that time she was losing weight and having significant GI side effects with nintedanib full dose.  Told her to reduce the nintedanib dose to 100 mg twice daily and also take Zofran as needed.  She is taking Zofran as needed.  She is tolerating the nintedanib low-dose much better.  Symptom scores are listed below.  Overall she feels better.  Weight loss is stabilized.  Infectious gained 1 pound of weight.  She feels that she will just take with nintedanib 100 mg twice daily using Zofran as needed.  She does not want to change the schedule.  I fully agree with her and supported on that.  She is aware that the low-dose is less effective but this might be best balance between risk and benefit.  She did ask about getting a COVID by Valent mRNA booster 2 oh micron and BA.5.  According to history she has had COVID x2.  Most recently was in June 2022 which would be against the current viral strain.  In addition she is got monoclonal antibody prophylaxis euvsheld in past.  She does socially isolate herself to the extent possible and masks.  She is going to get the monoclonal antibody prophylaxis again in a few weeks when she gets her Rituxan.  We did discuss the fact that because she is on Rituxan she is immunosuppressed and does not make antibodies and then the facet antibody transfer that she gets through [facility] is risk protective.  Additional risk protective feature is the fact that she had a current strain of COVID-19 and potentially has natural immunity.  Therefore it is her call to get the bivaet booster although she wanted to avoid it and I supported this in a shared decision making she could hold off till at least January 2023 when it  will be 6 months since her natural infection.     OV 07/18/2021  Subjective:  Patient ID: Stacey [name], female , DOB: 11-24-1940 , age 81 y.o. , MRN: [id number] , ADDRESS: [address] [place] [address] PCP Shon Baton, MD Patient Care Team: Shon Baton, MD as PCP - General (Internal Medicine) Buford Dresser, MD as PCP - Cardiology (Cardiology)  This Provider for this visit: Treatment Team:  Attending Provider: Brand Males, MD    07/18/2021 -   Chief Complaint  Patient presents with   Follow-up    Pt states she has been doing  okay since last visit. States her breathing is about the same.    Rheumatoid arthritis with Joint s/ILD- Rituxan/steroids (duke uinviesty)  - progressive phenotype (CT 2017/2019 -> Oct 2020). Planned Ofev start in Nov 2020 but did not start as of dec 2020. LFT normal Jan 2021   - Rituxan via [facility] -September 2021 + Medrol 61m/2mg QOD baseline many decades (on 857mper day since feb 2021 due to recurrent asthma flares)  - nintedanib for her ILD she started it on July 31, 2019 - started 10091mwice daily (started low dose du eto side effect concern) -> increased to 150m76md d June 2022 -> reduced to 100mg58m aug 2022 due tow eight loss  - ILD PRO registry study - since 08/16/19  Athma/obstructive lung disease phenotype on Dulera/Singulair  Post herpetic neuralgia - on elavil, gabapentin, effexor  Coronary Artery Calcification - 1 vessel -  S/p - covid-19 on 05/31/2019. Admitted 06/11/2019  - 06/16/2019. Rx with Remdesviri and Decadron.  - sp evushed x 2  Admitted August 2021 for respiratory infection not otherwise specified.  [place]  Small Hiatal hernia on CT Oct 2020  Rib fracture from fall Dec 2020: Musculoskeletal: There is nondisplaced fractures of the posterior left fifth, 6, 8, 9, and tenth left ribs. There is unchanged slight superior compression deformities of the  T5, T6, T10 vertebral bodies with less than 25% loss in height.Associated pulmnary contusion +  -On Reclast once a year through Dr. Jenni[facility]da Paschalmal ECHO Summer 2020 [facility]   xxxx HPI MargaMOET [id number] -presents for follow-up.  Last visit September 2022.  Since then she is doing stable.  With a lower dose of nintedanib no more diarrhea.  Weight loss is stopped.  Barely any nausea.  She is not needing Zofran for nausea control.  Her main concern right now is that she is having imbalance issues.  She is stopped working with the physical therapy.  She thinks it is 1 of proprioception but also long-term muscle loss and sarcopenia.  I have advised her to take this with her primary care/rheumatology.  Terms of asthma is stable on Breo and Singulair.  Last liver function test April 2022 in our chart.  Latest pulmonary function test shows stability    CT Chest data  No results found.      SYMPTOM SCALE - ILD 03/27/2019  05/24/2019 (2-3 weeks pre cpovid)  08/03/2019 Post covid Post fall rib # 12/18/2019  04/10/2020 142# resp virus admit aug 2021 nos 08/01/2020 134#  10/22/2020 136# Now doing rehab 12/23/2020 136# 03/10/2021 131# Ofev 150mg 57m9/27/2022 132# Ofev 100mg b29m/12/2021 133# ofev 100 x 2  O2 use ra       ra ra ra ra  Shortness of Breath 0 -> 5 scale with 5 being worst (score 6 If unable to do)            At rest 0 _0 0 3 0 _1 Simple tasks - showers, clothes change, eating, shaving 0 _2 Household (dishes, doing bed, laundry) _3 Shopping _4 Walking level at own pace _5 3  Walking up Stairs _0 Total (40 - 48) Dyspnea Score _1 How bad is your cough? _2 0 0 0 1 0 0  How bad is your fatigue 3 3.5 5 3._3 nause    0 1 1 0 1 2._4 vomit    0 1 1 0 1 2._5 diarrhea     0 1 0 0 0 1 0 0  anxiety    0 2 0 1 0 1 0 0  depression    0 1 0 0 0 0 0 0     Simple office walk  feet x  3 laps goal with forehead probe 04/12/2018  07/21/2018  05/24/2019  12/18/2019  04/10/2020  08/01/2020  10/22/2020  03/10/2021  07/18/2021   O2 used Room air - off o2 x 10 min Room air  Room air _6  ra  Number laps completed 3 x 185 feet 3 x 250 feet    r     Comments about pace normal normal   Slow, no walker Slow . No walker.  Slow. No walker. No cane walker   Resting Pulse Ox/HR 99% and 72/min 97% and 74/min 97% and 81/min 97% and 76/m 96% and 88/min 94% and 83 96% and HR 79 97% and HR 51 100% and 78  Final Pulse Ox/HR 98% and 95/min 96% and 96/min 94% and 107/min 93% and 94 92% and 97 90% and 100 89% and 104/min 92% and 106 97% and 102  Desaturated </= 88% _7  yes yes no no  Desaturated <= 3% points no no Yes, 3 pints Ues, 4 points Yes,  4 points Yes, 4 points Yes, 7 points  Yes, 3 pints  Got Tachycardic >/= 90/min _8  yes yes    Symptoms at end of test No complaint Mild dyspnea Mod dyspnea Some dyspnea dyspnea dyspnie cat 3d Dyspneic moderate  Dyspneic and wheezing Mod dyspnea  Miscellaneous comments improed from hospital Same v improved woprse same  Wobbly gait baseline Needed 2 break  Stopped x 2 to get balanced and staggered while alking     PFT  PFT Results Latest Ref Rng & Units 07/02/2021 03/07/2021 12/23/2020 10/22/2020 09/10/2020 04/08/2020 12/12/2019  FVC-Pre L 1.69 1.78 1.66 1.73 1.76 1.83 1.90  FVC-Predicted Pre % 62 66 60 63 64 66 68  FVC-Post L - - - - - - -  FVC-Predicted Post % - - - - - - -  Pre FEV1/FVC % % 73 72 71 76 75 74 78  Post FEV1/FCV % % - - - - - - -  FEV1-Pre L 1.23 1.28 1.17 1.30 1.32 1.35 1.48  FEV1-Predicted Pre % 61 64 57 64 64 66 71  FEV1-Post L - - - - - - -  DLCO uncorrected ml/min/mmHg 15.35 12.94 14.98 13.67 15.38 14.39 13.91  DLCO UNC% % 79 66 77 70 79 74 71  DLCO corrected ml/min/mmHg 15.35 12.94 14.98  13.80 15.38 15.49 13.91  DLCO COR %Predicted % 79 66 77 71 79 79 71  DLVA Predicted % 100 84 86 80 102 108 86  TLC L - - - - - - -  TLC % Predicted % - - - - - - -  RV % Predicted % - - - - - - -       has a past medical history of Abnormal finding of blood chemistry, Asthma, H/O measles, H/O varicella, Hypertension, 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[place]), 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   reports that she has never smoked. She has never used smokeless tobacco.  Past Surgical History:  Procedure Laterality Date   CHOLECYSTECTOMY  2011   IR ANGIO INTRA EXTRACRAN SEL COM CAROTID INNOMINATE BILAT MOD SED  06/18/2020   IR ANGIO VERTEBRAL SEL SUBCLAVIAN INNOMINATE UNI R MOD SED  06/18/2020   IR ANGIO VERTEBRAL SEL VERTEBRAL UNI L MOD SED  06/18/2020   WISDOM TOOTH EXTRACTION      Allergies  Allergen Reactions   Penicillins Other (See Comments)    Reaction unknown occurred during childhood Has patient had a PCN reaction causing immediate rash, facial/tongue/throat swelling, SOB or lightheadedness with hypotension: Unknown Has patient had a PCN reaction causing severe rash involving mucus membranes or skin necrosis: Unknown Has patient had a PCN reaction that required hospitalization: No Has patient had a PCN reaction occurring within the last 10 years: No If all of the above answers are "NO", then may proceed with Cephalosporin use.  Unsur   Remicade [Infliximab] Other (See Comments)    Reaction unknown   Sulfa Antibiotics Other (See Comments)    Reaction unknown    Immunization History  Administered Date(s) Administered   Fluad Quad(high Dose 65+) 04/23/2020   Influenza Split 04/12/2013, 04/12/2014, 03/14/2015, 04/23/2016   Influenza, High Dose Seasonal PF 04/12/2017, 03/24/2018, 04/13/2019, 03/27/2021    Influenza-Unspecified 04/03/2013, 04/22/2017   Moderna Sars-Covid-2 Vaccination 08/25/2019, 09/22/2019, 03/07/2020   Pfizer Covid-19 Vaccine Bivalent Booster 22yr & up 05/16/2021   Pneumococcal Conjugate-13 04/03/2013, 09/28/2014   Pneumococcal Polysaccharide-23 07/13/2012, 07/24/2013   Td 01/11/2018   Tdap 12/10/2017    Family History  Problem Relation Age of Onset   Asthma Mother    Anemia Mother    Polymyalgia rheumatica Mother    COPD Father    Pulmonary fibrosis Father    Breast cancer Maternal Grandmother 88     Current Outpatient Medications:    amitriptyline (ELAVIL) 25 MG tablet, Take 25 mg by mouth at bedtime., Disp: , Rfl: 0   benzonatate (TESSALON) 100 MG capsule, Take 100 mg by mouth as needed for cough., Disp: , Rfl:    Biotin 1000 MCG tablet, Take 1,000 mcg by mouth daily., Disp: , Rfl:    cholecalciferol (VITAMIN D) 1000 UNITS tablet, Take 1,000 Units by mouth daily., Disp: , Rfl:    Estradiol 10 MCG TABS vaginal tablet, Place 10 mcg vaginally daily as needed (dryness). , Disp: , Rfl:    fluticasone furoate-vilanterol (BREO ELLIPTA) 100-25 MCG/INH AEPB, Inhale 1 puff into the lungs daily., Disp: 60 each, Rfl: 5   gabapentin (NEURONTIN) 300 MG capsule, Take 300 mg by mouth at bedtime., Disp: , Rfl:    lactulose (CEPHULAC) 10 g packet, Take 10 g by mouth daily as needed., Disp: , Rfl:    losartan (COZAAR) 25 MG tablet, Take 50 mg by mouth daily., Disp: , Rfl:    methylPREDNISolone (MEDROL) 4 MG tablet, Take 1 tablet (4 mg total) by mouth every other day. Alternate with the 231mdose (Patient taking differently: Take 4 mg by mouth daily.), Disp:  , Rfl:    mometasone-formoterol (DULERA) 100-5 MCG/ACT AERO, Inhale  2 puffs into the lungs 2 (two) times daily. (Patient taking differently: Inhale 2 puffs into the lungs 2 (two) times daily. Patient uses as needed.), Disp: 39 g, Rfl: 0   Nintedanib (OFEV) 100 MG CAPS, Take 1 capsule (100 mg total) by mouth 2 (two) times daily.,  Disp: 180 capsule, Rfl: 1   ondansetron (ZOFRAN) 4 MG tablet, Take 1 tablet (4 mg total) by mouth every 8 (eight) hours as needed for nausea or vomiting., Disp: 30 tablet, Rfl: 0   pantoprazole (PROTONIX) 40 MG tablet, Take 1 tablet (40 mg total) by mouth 2 (two) times daily before a meal., Disp: 180 tablet, Rfl: 3   polyethylene glycol (MIRALAX / GLYCOLAX) 17 g packet, Take 17 g by mouth as needed., Disp: , Rfl:    pravastatin (PRAVACHOL) 20 MG tablet, TAKE 1 TABLET IN THE P.M., Disp: 90 tablet, Rfl: 3   riTUXimab (RITUXAN) 100 MG/10ML injection, Inject 100 mg into the vein every 6 (six) months. , Disp: , Rfl:    senna (SENOKOT) 8.6 MG TABS tablet, Take 1 tablet by mouth as needed for mild constipation., Disp: , Rfl:    sulfamethoxazole-trimethoprim (BACTRIM DS) 800-160 MG tablet, TAKE (1) TABLET ON MONDAY, WEDNESDAY AND FRIDAY., Disp: 12 tablet, Rfl: 5   venlafaxine XR (EFFEXOR-XR) 75 MG 24 hr capsule, Take 75 mg by mouth at bedtime. , Disp: , Rfl:       Objective:   Vitals:   07/18/21 0900  BP: 122/70  Pulse: 78  Temp: (!) 97.5 F (36.4 C)  TempSrc: Oral  SpO2: 100%  Weight: 133 lb 6.4 oz (60.5 kg)  Height: 5' 6" (1.676 m)    Estimated body mass index is 21.53 kg/m as calculated from the following:   Height as of this encounter: 5' 6" (1.676 m).   Weight as of this encounter: 133 lb 6.4 oz (60.5 kg).  _0 @  Filed Weights   07/18/21 0900  Weight: 133 lb 6.4 oz (60.5 kg)     Physical Exam    General: No distress. Looks well Neuro: Alert and Oriented x 3. GCS 15. Speech normal Psych: Pleasant Resp:  Barrel Chest - no.  Wheeze - no, Crackles - yes, No overt respiratory distress CVS: Normal heart sounds. Murmurs - no Ext: Stigmata of Connective Tissue Disease - no HEENT: Normal upper airway. PEERL +. No post nasal drip        Assessment:       ICD-10-CM   1. Interstitial lung disease due to connective tissue disease ([place])  J84.89    M35.9     2.  Encounter for long-term current use of high risk medication  Z79.899     3. Asthma, unspecified asthma severity, unspecified whether complicated, unspecified whether persistent  J45.909          Plan:     Patient Instructions    ASthma stable  Interstitial lung disease itself   stable since last visit  Tolerating lower dose ofev better   - no diarrhea  - weight loss stopped  - not needing zofran with lower dose Plan - continue ofev 124m twice daily - can take zofran 469mtwice daily as needed with ofev  - -Continue Rituxan and steroids through the rheumatologist - continuie  breo 100 dosing daily once with albuterol as needed  - cotninue singulair - do LFT 07/18/2021     Follow-up - 3 months do LFT - 6 months  do LFT and spiro/dlco  - return  30 min slot with Dr Chase Caller  in 6 months - ILD questionnaire and simple walk test at followup    SIGNATURE    Dr. Brand Males, M.D., F.C.C.P,  Pulmonary and Critical Care Medicine Staff Physician, [place] Director - Interstitial Lung Disease  Program  [facility] [place], [place], [address]  Pager: [phone], If no answer or between  15:00h - 7:00h: call [phone] Telephone: [phone]  9:26 AM 07/18/2021

## 2021-07-18 NOTE — Patient Instructions (Addendum)
ICD-10-CM   1. Interstitial lung disease due to connective tissue disease ([place])  J84.89    M35.9     2. Encounter for long-term current use of high risk medication  Z79.899     3. Asthma, unspecified asthma severity, unspecified whether complicated, unspecified whether persistent  J45.909     4. Balance problem  R26.89        ASthma stable  Interstitial lung disease itself   stable since last visit  Tolerating lower dose ofev better   - no diarrhea  - weight loss stopped  - not needing zofran with lower dose Plan - continue ofev 100mg  twice daily - can take zofran 4mg  twice daily as needed with ofev  - -Continue Rituxan and steroids through the rheumatologist - continuie  breo 100 dosing daily once with albuterol as needed  - cotninue singulair - do LFT 07/18/2021 - talk with PCP Shon Baton, MD about balance     Follow-up - 3 months do LFT - 6 months  do LFT and spiro/dlco  - return 30 min slot with Dr Chase Caller  in 6 months - ILD questionnaire and simple walk test at followup

## 2021-07-18 NOTE — Addendum Note (Signed)
Addended by: Lorretta Harp on: 07/18/2021 09:41 AM   Modules accepted: Orders

## 2021-07-23 ENCOUNTER — Encounter (HOSPITAL_BASED_OUTPATIENT_CLINIC_OR_DEPARTMENT_OTHER): Payer: Self-pay | Admitting: Cardiology

## 2021-07-23 ENCOUNTER — Ambulatory Visit (INDEPENDENT_AMBULATORY_CARE_PROVIDER_SITE_OTHER): Payer: Medicare Other | Admitting: Cardiology

## 2021-07-23 ENCOUNTER — Other Ambulatory Visit: Payer: Self-pay

## 2021-07-23 VITALS — BP 136/64 | HR 86 | Ht 66.0 in | Wt 133.9 lb

## 2021-07-23 DIAGNOSIS — Z7189 Other specified counseling: Secondary | ICD-10-CM

## 2021-07-23 DIAGNOSIS — I2510 Atherosclerotic heart disease of native coronary artery without angina pectoris: Secondary | ICD-10-CM

## 2021-07-23 DIAGNOSIS — E7800 Pure hypercholesterolemia, unspecified: Secondary | ICD-10-CM

## 2021-07-23 DIAGNOSIS — J849 Interstitial pulmonary disease, unspecified: Secondary | ICD-10-CM | POA: Diagnosis not present

## 2021-07-23 DIAGNOSIS — I10 Essential (primary) hypertension: Secondary | ICD-10-CM | POA: Diagnosis not present

## 2021-07-23 MED ORDER — PRAVASTATIN SODIUM 20 MG PO TABS: ORAL_TABLET | ORAL | 3 refills | Status: DC

## 2021-07-23 NOTE — Patient Instructions (Signed)
Medication Instructions:  ?Your Physician recommend you continue on your current medication as directed.   ? ?*If you need a refill on your cardiac medications before your next appointment, please call your pharmacy* ? ? ?Lab Work: ?None ordered today ? ? ?Testing/Procedures: ?None ordered today ? ? ?Follow-Up: ?At [facility], you and your health needs are our priority.  As part of our continuing mission to provide you with exceptional heart care, we have created designated Provider Care Teams.  These Care Teams include your primary Cardiologist (physician) and Advanced Practice Providers (APPs -  Physician Assistants and Nurse Practitioners) who all work together to provide you with the care you need, when you need it. ? ?We recommend signing up for the patient portal called "MyChart".  Sign up information is provided on this After Visit Summary.  MyChart is used to connect with patients for Virtual Visits (Telemedicine).  Patients are able to view lab/test results, encounter notes, upcoming appointments, etc.  Non-urgent messages can be sent to your provider as well.   ?To learn more about what you can do with MyChart, go to [email or web address].   ? ?Your next appointment:   ?1 year(s) ? ?The format for your next appointment:   ?In Person ? ?Provider:   ?Bridgette Christopher, MD{ ? ?

## 2021-07-23 NOTE — Progress Notes (Incomplete)
Cardiology Office Note:    Date:  07/23/2021   ID:  Stacey [name], DOB [birth year], MRN [id number]  PCP:  Shon Baton, MD  Cardiologist:  Buford Dresser, MD PhD  Referring MD: Shon Baton, MD   CC: follow up  History of Present Illness:    Stacey [name] is a 81 y.o. 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  Cardiac history: She was admitted 12/2018 for shortness of breath and found to have elevated troponins. We had an extensive conversation during her admission with shared decision making regarding the elevation of her troponin. She preferred medical management over an invasive strategy. She had a rise and fall of troponins, with peak of 2.38 (old assay), but no chest pain. Echo without wall motion abnormalities. She received >48 hours of heparin and was started on aspirin and statin. Coronary calcium was seen on her CT, and we discussed high intensity statin. However, she was concerned about myalgias and preferred pravastatin.   Today: Overall, she is feeling okay.  She endorses lightheadedness and worsening difficulty with balance. She is unsure if this is due to age.   Additionally, she recently had some edema in her right foot/ankle.   Her PCP changed her Losartan to 50 mg for her blood pressure.  Lately she has not needed to use her Zofran and Senokot medications, or her inhaler. She is using Miralax as needed. She is no longer taking lactulose.  She denies any palpitations, chest pain, or shortness of breath. No headaches, syncope, orthopnea, PND, or exertional symptoms.   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   Lichen sclerosus 76/72/09   Asymptomatic   Low iron    Mitral valve  prolapse    Osteoarthritis    Osteoporosis    Pneumonia    Post herpetic neuralgia    Rheumatoid arthritis(714.0)    Yeast infection     Past Surgical History:  Procedure Laterality Date   CHOLECYSTECTOMY  2011   IR ANGIO INTRA EXTRACRAN SEL COM CAROTID INNOMINATE BILAT MOD SED  06/18/2020   IR ANGIO VERTEBRAL SEL SUBCLAVIAN INNOMINATE UNI R MOD SED  06/18/2020   IR ANGIO VERTEBRAL SEL VERTEBRAL UNI L MOD SED  06/18/2020   WISDOM TOOTH EXTRACTION      Current Medications: Current Outpatient Medications on File Prior to Visit  Medication Sig   amitriptyline (ELAVIL) 25 MG tablet Take 25 mg by mouth at bedtime.   Biotin 1000 MCG tablet Take 1,000 mcg by mouth daily.   budesonide-formoterol (SYMBICORT) 80-4.5 MCG/ACT inhaler continuously as needed   cholecalciferol (VITAMIN D) 1000 UNITS tablet Take 1,000 Units by mouth daily.   Estradiol 10 MCG TABS vaginal tablet Place 10 mcg vaginally daily as needed (dryness).    fluticasone furoate-vilanterol (BREO ELLIPTA) 100-25 MCG/INH AEPB Inhale 1 puff into the lungs daily.   gabapentin (NEURONTIN) 300 MG capsule Take 300 mg by mouth at bedtime.   losartan (COZAAR) 25 MG tablet Take 50 mg by mouth daily.   methylPREDNISolone (MEDROL) 4 MG tablet Take 1 tablet (4 mg total) by mouth every other day. Alternate with the 2mg  dose (Patient taking differently: Take 4 mg by mouth daily.)  Nintedanib (OFEV) 100 MG CAPS Take 1 capsule (100 mg total) by mouth 2 (two) times daily.   pantoprazole (PROTONIX) 40 MG tablet Take 1 tablet (40 mg total) by mouth 2 (two) times daily before a meal.   riTUXimab (RITUXAN) 100 MG/10ML injection Inject 100 mg into the vein every 6 (six) months.    sulfamethoxazole-trimethoprim (BACTRIM DS) 800-160 MG tablet TAKE (1) TABLET ON MONDAY, WEDNESDAY AND FRIDAY.   venlafaxine XR (EFFEXOR-XR) 75 MG 24 hr capsule Take 75 mg by mouth at bedtime.    gabapentin (NEURONTIN) 300 MG capsule Take 300 mg by mouth daily.    mometasone-formoterol (DULERA) 100-5 MCG/ACT AERO Inhale 2 puffs into the lungs 2 (two) times daily. (Patient not taking: Reported on 07/23/2021)   polyethylene glycol (MIRALAX / GLYCOLAX) 17 g packet Take 17 g by mouth as needed. (Patient not taking: Reported on 07/23/2021)   No current facility-administered medications on file prior to visit.     Allergies:   Penicillins, Remicade [infliximab], and Sulfa antibiotics   Social History   Tobacco Use   Smoking status: Never   Smokeless tobacco: Never  Vaping Use   Vaping Use: Never used  Substance Use Topics   Alcohol use: No   Drug use: No    Family History: The patient's family history includes Anemia in her mother; Asthma in her mother; Breast cancer (age of onset: 33) in her maternal grandmother; COPD in her father; Polymyalgia rheumatica in her mother; Pulmonary fibrosis in her father.  ROS:   Please see the history of present illness.   (+) Lightheadedness (+) Imbalance (+) Right ankle edema Additional pertinent ROS otherwise unremarkable.  EKGs/Labs/Other Studies Reviewed:    The following studies were reviewed today:  Echo 12/10/2020:  1. Left ventricular ejection fraction, by estimation, is 65 to 70%. The  left ventricle has normal function. The left ventricle has no regional  wall motion abnormalities. Left ventricular diastolic parameters are  consistent with Grade I diastolic  dysfunction (impaired relaxation). Elevated left ventricular end-diastolic  pressure. The E/e' is 74. The average left ventricular global longitudinal  strain is -18.4 %.   2. Right ventricular systolic function is normal. The right ventricular  size is normal. There is mildly elevated pulmonary artery systolic  pressure. The estimated right ventricular systolic pressure is 26.3 mmHg.   3. The mitral valve is abnormal. Mild mitral valve regurgitation.   4. The aortic valve is tricuspid. Aortic valve regurgitation is not  visualized. Mild  aortic valve sclerosis is present, with no evidence of  aortic valve stenosis.   5. The inferior vena cava is normal in size with greater than 50%  respiratory variability, suggesting right atrial pressure of 3 mmHg.   Comparison(s): Changes from prior study are noted. 12/18/18 EF 60-65%. PA  pressure 30mmHg.   CT Chest 03/04/2020: COMPARISON:  07/03/2019.   FINDINGS: Cardiovascular: Aberrant right subclavian artery. Atherosclerotic calcification of the aorta, aortic valve and coronary arteries. Heart is enlarged. Small amount of pericardial fluid is likely physiologic.   Mediastinum/Nodes: No pathologically enlarged mediastinal or axillary lymph nodes. Hilar regions are difficult to definitively evaluate without IV contrast. Esophagus is unremarkable.   Lungs/Pleura: Biapical pleuroparenchymal scarring. Patchy and largely peripheral peribronchovascular ground-glass, increased from 07/03/2019. Underlying subpleural reticulation, traction bronchiectasis/bronchiolectasis and scattered honeycombing, similar to minimally increased from 07/03/2019. Calcified granulomas. No pleural fluid. Airway is unremarkable. No air trapping.   Upper Abdomen: Visualized portions of the liver, adrenal glands, kidneys, spleen, pancreas, stomach and bowel  are unremarkable with the exception of a small hiatal hernia. Cholecystectomy. Calcified upper abdominal lymph nodes.   Musculoskeletal: Degenerative changes in the spine. No worrisome lytic or sclerotic lesions.   IMPRESSION: 1. Increased patchy pulmonary parenchymal ground-glass may be due to an atypical/viral pneumonia, including due to COVID-19. 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 2. Aortic atherosclerosis (ICD10-I70.0). Coronary artery calcification.  Echo  12/18/18  1. The left ventricle has normal systolic function with an ejection fraction of 60-65%. The cavity size was normal. There is mildly increased left ventricular wall thickness. Left ventricular diastolic Doppler parameters are consistent with impaired  relaxation. No evidence of left ventricular regional wall motion abnormalities.  2. The right ventricle has normal systolic function. The cavity was normal. There is no increase in right ventricular wall thickness. Right ventricular systolic pressure normal with an estimated pressure of 27.8 mmHg.  3. The mitral valve is grossly normal. There is mild mitral annular calcification present.  4. The tricuspid valve is grossly normal. There is mild tricuspid regurgitation.  5. The aortic valve is tricuspid with moderate calcification and thickening of the noncoronary cusp.  6. The aortic root is normal in size and structure.    EKG:  EKG is personally reviewed.   07/23/2021: *** 02/09/20: NSR  Recent Labs: 07/18/2021: ALT 12   Recent Lipid Panel    Component Value Date/Time   CHOL 222 (H) 12/19/2018 1532   TRIG 79 06/11/2019 2338   HDL 59 12/19/2018 1532   CHOLHDL 3.8 12/19/2018 1532   VLDL 26 12/19/2018 1532   LDLCALC 137 (H) 12/19/2018 1532    Physical Exam:    VS:  BP 136/64 (BP Location: Left Arm, Patient Position: Sitting, Cuff Size: Normal)    Pulse 86    Ht 5\' 6"  (1.676 m)    Wt 133 lb 14.4 oz (60.7 kg)    SpO2 99%    BMI 21.61 kg/m     Wt Readings from Last 3 Encounters:  07/23/21 133 lb 14.4 oz (60.7 kg)  07/18/21 133 lb 6.4 oz (60.5 kg)  04/08/21 132 lb 12.8 oz (60.2 kg)   GEN: Well nourished, well developed in no acute distress HEENT: Normal, moist mucous membranes NECK: No JVD CARDIAC: regular rhythm, normal S1 and S2, no rubs or gallops. No murmur. VASCULAR: Radial and DP pulses 2+ bilaterally. No carotid bruits RESPIRATORY:  Clear to auscultation without rales, wheezing or rhonchi  ABDOMEN: Soft, non-tender,  non-distended MUSCULOSKELETAL:  Ambulates independently SKIN: Warm and dry, ***right ankle edema from bursa NEUROLOGIC:  Alert and oriented x 3. No focal neuro deficits noted. PSYCHIATRIC:  Normal affect    ASSESSMENT:    1. Coronary artery calcification seen on CAT scan   2. Pure hypercholesterolemia     PLAN:    Interstitial lung disease, followed by Dr. Chase Caller  Intermittent dizziness -try to catch vitals during an episode.  Coronary calcium on CT: suggests she may have underlying CAD, recommend aggressive prevention  Hyperlipidemia:  -see prior discussions re: high intensity statins -continue pravastatin 20 mg nightly per patient preference   Hypertension: well controlled today -continue losartan  Cardiac risk counseling and prevention recommendations: -recommend heart healthy/Mediterranean diet, with whole grains, fruits, vegetable, fish, lean meats, nuts, and olive oil. Limit salt. -recommend moderate walking, 3-5 times/week for 30-50 minutes each session. Aim for at least 150 minutes.week. Goal should  be pace of 3 miles/hours, or walking 1.5 miles in 30 minutes -recommend avoidance of tobacco products. Avoid excess alcohol. -ASCVD risk score: The ASCVD Risk score (Arnett DK, et al., 2019) failed to calculate for the following reasons:   The 2019 ASCVD risk score is only valid for ages 9 to 49    Plan for follow up: 1 year or sooner as needed.  Medication Adjustments/Labs and Tests Ordered: Current medicines are reviewed at length with the patient today.  Concerns regarding medicines are outlined above.   Orders Placed This Encounter  Procedures   EKG 12-Lead   Meds ordered this encounter  Medications   pravastatin (PRAVACHOL) 20 MG tablet    Sig: TAKE 1 TABLET IN THE P.M.    Dispense:  90 tablet    Refill:  3   Patient Instructions  Medication Instructions:  Your Physician recommend you continue on your current medication as directed.    *If you need  a refill on your cardiac medications before your next appointment, please call your pharmacy*   Lab Work: None ordered today   Testing/Procedures: None ordered today   Follow-Up: At [facility], you and your health needs are our priority.  As part of our continuing mission to provide you with exceptional heart care, we have created designated Provider Care Teams.  These Care Teams include your primary Cardiologist (physician) and Advanced Practice Providers (APPs -  Physician Assistants and Nurse Practitioners) who all work together to provide you with the care you need, when you need it.  We recommend signing up for the patient portal called "MyChart".  Sign up information is provided on this After Visit Summary.  MyChart is used to connect with patients for Virtual Visits (Telemedicine).  Patients are able to view lab/test results, encounter notes, upcoming appointments, etc.  Non-urgent messages can be sent to your provider as well.   To learn more about what you can do with MyChart, go to [email or web address].    Your next appointment:   1 year(s)  The format for your next appointment:   In Person  Provider:   Buford Dresser, MD       [facility],acting as a scribe for Buford Dresser, MD.,have documented all relevant documentation on the behalf of Buford Dresser, MD,as directed by  Buford Dresser, MD while in the presence of Buford Dresser, MD.  ***  Signed, Buford Dresser, MD PhD 07/23/2021   [place]

## 2021-07-31 ENCOUNTER — Ambulatory Visit: Payer: Medicare Other | Admitting: Internal Medicine

## 2021-08-20 ENCOUNTER — Telehealth: Payer: Self-pay | Admitting: Internal Medicine

## 2021-08-20 NOTE — Telephone Encounter (Signed)
Received notification from [facility] regarding a prior authorization for [employer]. Authorization has been APPROVED from 08/20/21 to 08/20/22.   Patient can continue to fill through [facility]: [phone]  Knox Saliva, PharmD, MPH, BCPS Clinical Pharmacist (Rheumatology and Pulmonology)

## 2021-08-20 NOTE — Telephone Encounter (Signed)
Submitted a Prior Authorization request to [facility] for OFEV via CoverMyMeds. Will update once we receive a response.  Dose:  100mg  twice daily  Key: [id number]  Knox Saliva, PharmD, MPH, BCPS Clinical Pharmacist (Rheumatology and Pulmonology)

## 2021-08-20 NOTE — Telephone Encounter (Signed)
Called and spoke with patient who states that she is currently taking Ofev and has been for about a year. States that dose has been changed and she has been having side effects of nausea for the last couple months and states that recently it's got a little worse. She states that she is really trying to stay on it. She is going out of town for a week and would like to know if she can stop medicine for that week and then restart it when she gets back home.   Dr. Chase Caller please advise    Patient states that she got a letter from [facility] that states they are no longer going to cover Ofev. Pharmacy team please advise

## 2021-08-21 NOTE — Telephone Encounter (Signed)
Called and spoke with patient. She verbalized understanding.   Nothing further needed at time of call.  

## 2021-08-21 NOTE — Telephone Encounter (Signed)
Called and spoke with patient. She stated that she has been nauseated after taking the Ofev. She confirmed that she is only taking 1 capsule twice daily. She does have some diarrhea but it is not enough to be concerned about. She denied seeing any blood in her stool, stomach cramps.   She will be leaving for a week on Saturday and wants to know if she can stop the Ofev during her trip. She plans on resuming it once she returns home.   MR, can you please advise? Thanks!

## 2021-08-21 NOTE — Telephone Encounter (Signed)
Okay to stop nintedanib for a week during the trip and enjoy the trip.  No point having diarrhea and nausea during the trip.  Have a great trip

## 2021-09-09 DIAGNOSIS — I10 Essential (primary) hypertension: Secondary | ICD-10-CM | POA: Diagnosis not present

## 2021-09-09 DIAGNOSIS — M810 Age-related osteoporosis without current pathological fracture: Secondary | ICD-10-CM | POA: Diagnosis not present

## 2021-09-09 DIAGNOSIS — E785 Hyperlipidemia, unspecified: Secondary | ICD-10-CM | POA: Diagnosis not present

## 2021-09-09 DIAGNOSIS — J449 Chronic obstructive pulmonary disease, unspecified: Secondary | ICD-10-CM | POA: Diagnosis not present

## 2021-09-19 DIAGNOSIS — I10 Essential (primary) hypertension: Secondary | ICD-10-CM | POA: Diagnosis not present

## 2021-09-19 DIAGNOSIS — E785 Hyperlipidemia, unspecified: Secondary | ICD-10-CM | POA: Diagnosis not present

## 2021-09-19 DIAGNOSIS — M810 Age-related osteoporosis without current pathological fracture: Secondary | ICD-10-CM | POA: Diagnosis not present

## 2021-09-23 ENCOUNTER — Other Ambulatory Visit: Payer: Self-pay | Admitting: Internal Medicine

## 2021-09-26 DIAGNOSIS — Z0000 Encounter for general adult medical examination without abnormal findings: Secondary | ICD-10-CM | POA: Diagnosis not present

## 2021-09-26 DIAGNOSIS — M810 Age-related osteoporosis without current pathological fracture: Secondary | ICD-10-CM | POA: Diagnosis not present

## 2021-09-26 DIAGNOSIS — R82998 Other abnormal findings in urine: Secondary | ICD-10-CM | POA: Diagnosis not present

## 2021-09-26 DIAGNOSIS — I872 Venous insufficiency (chronic) (peripheral): Secondary | ICD-10-CM | POA: Diagnosis not present

## 2021-09-26 DIAGNOSIS — R296 Repeated falls: Secondary | ICD-10-CM | POA: Diagnosis not present

## 2021-09-26 DIAGNOSIS — E785 Hyperlipidemia, unspecified: Secondary | ICD-10-CM | POA: Diagnosis not present

## 2021-09-26 DIAGNOSIS — D692 Other nonthrombocytopenic purpura: Secondary | ICD-10-CM | POA: Diagnosis not present

## 2021-09-26 DIAGNOSIS — I10 Essential (primary) hypertension: Secondary | ICD-10-CM | POA: Diagnosis not present

## 2021-09-26 DIAGNOSIS — I700 Atherosclerosis of aorta: Secondary | ICD-10-CM | POA: Diagnosis not present

## 2021-09-26 DIAGNOSIS — J849 Interstitial pulmonary disease, unspecified: Secondary | ICD-10-CM | POA: Diagnosis not present

## 2021-09-26 DIAGNOSIS — M0510 Rheumatoid lung disease with rheumatoid arthritis of unspecified site: Secondary | ICD-10-CM | POA: Diagnosis not present

## 2021-09-26 DIAGNOSIS — D8989 Other specified disorders involving the immune mechanism, not elsewhere classified: Secondary | ICD-10-CM | POA: Diagnosis not present

## 2021-09-26 DIAGNOSIS — J449 Chronic obstructive pulmonary disease, unspecified: Secondary | ICD-10-CM | POA: Diagnosis not present

## 2021-10-08 ENCOUNTER — Emergency Department (HOSPITAL_COMMUNITY): Payer: Medicare Other

## 2021-10-08 ENCOUNTER — Emergency Department (HOSPITAL_COMMUNITY)
Admission: EM | Admit: 2021-10-08 | Discharge: 2021-10-08 | Disposition: A | Discharge status: Home / Self Care | Payer: Medicare Other | Attending: Emergency Medicine | Admitting: Emergency Medicine

## 2021-10-08 ENCOUNTER — Other Ambulatory Visit: Payer: Self-pay

## 2021-10-08 DIAGNOSIS — R079 Chest pain, unspecified: Secondary | ICD-10-CM | POA: Diagnosis not present

## 2021-10-08 DIAGNOSIS — I10 Essential (primary) hypertension: Secondary | ICD-10-CM | POA: Insufficient documentation

## 2021-10-08 DIAGNOSIS — S81812A Laceration without foreign body, left lower leg, initial encounter: Secondary | ICD-10-CM | POA: Diagnosis not present

## 2021-10-08 DIAGNOSIS — M542 Cervicalgia: Secondary | ICD-10-CM | POA: Diagnosis not present

## 2021-10-08 DIAGNOSIS — M79662 Pain in left lower leg: Secondary | ICD-10-CM | POA: Diagnosis not present

## 2021-10-08 DIAGNOSIS — J45909 Unspecified asthma, uncomplicated: Secondary | ICD-10-CM | POA: Insufficient documentation

## 2021-10-08 DIAGNOSIS — G319 Degenerative disease of nervous system, unspecified: Secondary | ICD-10-CM | POA: Diagnosis not present

## 2021-10-08 DIAGNOSIS — W1789XA Other fall from one level to another, initial encounter: Secondary | ICD-10-CM | POA: Insufficient documentation

## 2021-10-08 DIAGNOSIS — W19XXXA Unspecified fall, initial encounter: Secondary | ICD-10-CM

## 2021-10-08 DIAGNOSIS — S0990XA Unspecified injury of head, initial encounter: Secondary | ICD-10-CM | POA: Diagnosis not present

## 2021-10-08 DIAGNOSIS — R0902 Hypoxemia: Secondary | ICD-10-CM | POA: Diagnosis not present

## 2021-10-08 DIAGNOSIS — S42032A Displaced fracture of lateral end of left clavicle, initial encounter for closed fracture: Secondary | ICD-10-CM | POA: Diagnosis not present

## 2021-10-08 DIAGNOSIS — S0003XA Contusion of scalp, initial encounter: Secondary | ICD-10-CM | POA: Diagnosis not present

## 2021-10-08 DIAGNOSIS — J9811 Atelectasis: Secondary | ICD-10-CM | POA: Diagnosis not present

## 2021-10-08 DIAGNOSIS — R58 Hemorrhage, not elsewhere classified: Secondary | ICD-10-CM | POA: Diagnosis not present

## 2021-10-08 DIAGNOSIS — G4489 Other headache syndrome: Secondary | ICD-10-CM | POA: Diagnosis not present

## 2021-10-08 DIAGNOSIS — R102 Pelvic and perineal pain: Secondary | ICD-10-CM | POA: Diagnosis not present

## 2021-10-08 DIAGNOSIS — S4992XA Unspecified injury of left shoulder and upper arm, initial encounter: Secondary | ICD-10-CM | POA: Diagnosis present

## 2021-10-08 LAB — CBC WITH DIFFERENTIAL/PLATELET
Abs Immature Granulocytes: 0.02 10*3/uL (ref 0.00–0.07)
Basophils Absolute: 0.1 10*3/uL (ref 0.0–0.1)
Basophils Relative: 1 %
Eosinophils Absolute: 0.3 10*3/uL (ref 0.0–0.5)
Eosinophils Relative: 3 %
HCT: 37.4 % (ref 36.0–46.0)
Hemoglobin: 12.1 g/dL (ref 12.0–15.0)
Immature Granulocytes: 0 %
Lymphocytes Relative: 29 %
Lymphs Abs: 2.8 10*3/uL (ref 0.7–4.0)
MCH: 30.2 pg (ref 26.0–34.0)
MCHC: 32.4 g/dL (ref 30.0–36.0)
MCV: 93.3 fL (ref 80.0–100.0)
Monocytes Absolute: 1.2 10*3/uL — ABNORMAL HIGH (ref 0.1–1.0)
Monocytes Relative: 12 %
Neutro Abs: 5.4 10*3/uL (ref 1.7–7.7)
Neutrophils Relative %: 55 %
Platelets: 248 10*3/uL (ref 150–400)
RBC: 4.01 MIL/uL (ref 3.87–5.11)
RDW: 14 % (ref 11.5–15.5)
WBC: 9.8 10*3/uL (ref 4.0–10.5)
nRBC: 0 % (ref 0.0–0.2)

## 2021-10-08 LAB — URINALYSIS, ROUTINE W REFLEX MICROSCOPIC
Bilirubin Urine: NEGATIVE
Glucose, UA: NEGATIVE mg/dL
Hgb urine dipstick: NEGATIVE
Ketones, ur: NEGATIVE mg/dL
Nitrite: NEGATIVE
Protein, ur: NEGATIVE mg/dL
Specific Gravity, Urine: 1.01 (ref 1.005–1.030)
pH: 7 (ref 5.0–8.0)

## 2021-10-08 LAB — URINALYSIS, MICROSCOPIC (REFLEX)
Bacteria, UA: NONE SEEN
RBC / HPF: NONE SEEN RBC/hpf (ref 0–5)

## 2021-10-08 LAB — COMPREHENSIVE METABOLIC PANEL
ALT: 15 U/L (ref 0–44)
AST: 23 U/L (ref 15–41)
Albumin: 3.5 g/dL (ref 3.5–5.0)
Alkaline Phosphatase: 45 U/L (ref 38–126)
Anion gap: 8 (ref 5–15)
BUN: 13 mg/dL (ref 8–23)
CO2: 29 mmol/L (ref 22–32)
Calcium: 8.8 mg/dL — ABNORMAL LOW (ref 8.9–10.3)
Chloride: 102 mmol/L (ref 98–111)
Creatinine, Ser: 0.8 mg/dL (ref 0.44–1.00)
GFR, Estimated: >60 mL/min (ref >60)
Glucose, Bld: 88 mg/dL (ref 70–99)
Potassium: 4.2 mmol/L (ref 3.5–5.1)
Sodium: 139 mmol/L (ref 135–145)
Total Bilirubin: 0.4 mg/dL (ref 0.3–1.2)
Total Protein: 6 g/dL — ABNORMAL LOW (ref 6.5–8.1)

## 2021-10-08 LAB — TROPONIN I (HIGH SENSITIVITY)
Troponin I (High Sensitivity): 9 ng/L (ref <18)
Troponin I (High Sensitivity): 9 ng/L (ref <18)

## 2021-10-08 MED ORDER — ACETAMINOPHEN 500 MG PO TABS
1000.0000 mg | ORAL_TABLET | Freq: Four times a day (QID) | ORAL | Status: DC | PRN
  Administered 2021-10-08: 1000 mg via ORAL
  Filled 2021-10-08: qty 2

## 2021-10-08 MED ORDER — DOXYCYCLINE HYCLATE 100 MG PO CAPS: 100.0000 mg | ORAL_CAPSULE | Freq: Two times a day (BID) | ORAL | 0 refills | Status: AC

## 2021-10-08 MED ORDER — SODIUM CHLORIDE 0.9 % IV BOLUS
500.0000 mL | Freq: Once | INTRAVENOUS | Status: AC
  Administered 2021-10-08: 500 mL via INTRAVENOUS

## 2021-10-08 MED ORDER — DOXYCYCLINE HYCLATE 100 MG PO TABS
100.0000 mg | ORAL_TABLET | Freq: Once | ORAL | Status: AC
Start: 2021-10-08 — End: 2021-10-08
  Administered 2021-10-08: 100 mg via ORAL
  Filled 2021-10-08: qty 1

## 2021-10-08 MED ORDER — LIDOCAINE-EPINEPHRINE (PF) 2 %-1:200000 IJ SOLN
20.0000 mL | Freq: Once | INTRAMUSCULAR | Status: AC
  Administered 2021-10-08: 20 mL
  Filled 2021-10-08: qty 20

## 2021-10-08 NOTE — Discharge Instructions (Addendum)
You were seen in the emergency room today after a fall.  You have a complex wound to your left lower leg that required many stitches (25 in total).  These will need to be removed in the next 7 to 10 days.  I am starting you on antibiotics and will have you avoid submerging the laceration in bath water.  You may clean around it gently with a washcloth.  I have placed a referral in our system for wound care and have also ordered home health including evaluation for wound management at home.  If you develop redness or drainage from the wound you should seek care either with your primary care doctor or return to the emergency department for reevaluation.  ? ?Your CT scans did not show any broken bones or bleeding in the head or neck.  Your left clavicle is broken and we have placed you in a sling for comfort.  Please follow with orthopedics.  You may take Tylenol as needed for pain.  ? ?You should also be hearing from the home health team to schedule an initial evaluation at your house.  ?

## 2021-10-08 NOTE — Progress Notes (Signed)
Transition of Care [facility]) - Emergency Department Mini Assessment ? ? ?Patient Details  ?Name: Stacey [name] ?MRN: [id number] ?Date of Birth: [birth year] ? ?Transition of Care (TOC) CM/SW Contact:    ?Laurena Slimmer, RN ?Phone Number: ?10/08/2021, 10:54 PM ? ? ?Clinical Narrative: ?ED RNCM consulted for [facility] recommendation. RNCM met with patient and daughter at bedside to discuss recommendations for [facility] they are agreeable after this injury. Patient states, her balance have been off lately. No significant finding on ED work-up.  Patient states, she does not have preference with [facility] agencies. Referral forwarded to [facility] and explained that they nurse will contact her 24-48 hours post discharge patient and daughter verbalized understanding. Updated EDP ? ? ?ED Mini Assessment: ?What brought you to the Emergency Department? : (Pended) Mechanical Fall ? ?Barriers to Discharge: (Pended) No Barriers Identified ? ?  ? ?Means of departure: (Pended) Car ? ?Interventions which prevented an admission or readmission: (Pended) [place] or Services ? ? ? ?Patient Contact and Communications ?  ?  ?  ? ,     ?  ?  ? ?Patient states their goals for this hospitalization and ongoing recovery are:: (Pended) To get better ?CMS Medicare.gov Compare Post Acute Care list provided to:: (Pended) Patient ?  ? ?Admission diagnosis:  fall no thinners ?Patient Active Problem List  ? Diagnosis Date Noted  ? Chronic cough 06/12/2020  ? Constipation   ? CAP (community acquired pneumonia) 02/09/2020  ? Acute on chronic respiratory failure with hypoxia ([place]) 07/06/2019  ? Fall at home, initial encounter 07/06/2019  ? Orthostatic hypotension 07/06/2019  ? Rib fractures 07/04/2019  ? Pneumonia due to COVID-19 virus 06/11/2019  ? 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[place]) 05/07/2019  ? Acute asthma exacerbation 12/18/2018  ? Elevated troponin 12/18/2018  ? Acute bronchitis 08/30/2018  ? Chronic respiratory failure with hypoxia ([place]) 04/06/2018  ? Essential  hypertension 03/24/2018  ? Depression 03/24/2018  ? Asthma exacerbation 03/23/2018  ? DOE (dyspnea on exertion) 03/21/2018  ? 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 esophagitis 03/21/2018  ? Chronic lung disease   ? Hypoxia   ? Dyspnea 01/23/2018  ? ILD (interstitial lung disease) ([place]) 01/23/2018  ? 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 Multiple pulmonary nodules/RA lung dz  05/29/2013  ? Cough variant asthma vs uacs  05/29/2013  ? Rheumatoid arthritis ([place]) 01/13/2012  ? Osteopenia 01/13/2012  ? Lichen sclerosus et atrophicus of the vulva 01/13/2012  ? Asthma 01/13/2012  ? ?PCP:  Shon Baton, MD ?Pharmacy:   ?[place] ?[place] [address] ?Phone: [phone] Fax: [phone] ? ?[place] ?[place] [address] ?Phone: [phone] Fax: [phone] ? ?[facility] [place], [place] - [address] ?[address] ?[place] [address] ?Phone: [phone] Fax: [phone] ? ?[facility] - [address] [place], [place] - [address] ?[address] ?[place] [address] ?Phone: [phone] Fax: [phone] ? ? ?

## 2021-10-08 NOTE — ED Triage Notes (Signed)
Pt bib [facility] from home for fall. Pt got back from dinner and was standing on the porch when she tripped and fell about 1-2 feet down to the ground. Pt has an abrasion to her Lt forehead. Possible evulsion to Lt leg, put has full movement and pulses. Pt also c/o Lt shoulder soreness, no deformity, pt has full movement. Daughter states pt had balance and dizziness issues but nothing unusual happed prior to fall. Pt A&Ox4, VSS, bleeding controlled. ? ?EMS vitals ?132/80 BP ?70 HR ?16 RR ?96 O2 ?96 CBG ?

## 2021-10-08 NOTE — Progress Notes (Signed)
Orthopedic Tech Progress Note ?Patient Details:  ?Stacey [name] ?10-01-1940 ?[id number] ? ?Ortho Devices ?Type of Ortho Device: Shoulder immobilizer ?Ortho Device/Splint Location: LLE ?Ortho Device/Splint Interventions: Ordered, Application, Adjustment ?  ?Post Interventions ?Patient Tolerated: Well ?Instructions Provided: Adjustment of device, Care of device ? ?[name] ?10/08/2021, 11:30 PM ? ?

## 2021-10-08 NOTE — ED Provider Notes (Signed)
? ?Emergency Department Provider Note ? ? ?I have reviewed the triage vital signs and the nursing notes. ? ? ?HISTORY ? ?Chief Complaint ?Fall ? ? ?HPI ?Stacey [name] is a 81 y.o. female with past history reviewed below, not on anticoagulation, presents the emergency department evaluation after fall off of the front porch today.  Fall was witnessed by family.  They state that today she was in her normal state of health but the daughter did notice some slight unsteadiness on her feet.  Patient fell striking her head but also causing a laceration to her left lower leg.  EMS was called and the wound is now hemostatic.  Patient also complaining of some left shoulder discomfort. No AMS or vomiting.  ? ?Past Medical History:  ?Diagnosis Date  ? Abnormal finding of blood chemistry   ? Asthma   ? H/O measles   ? H/O varicella   ? Hypertension   ? 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 Lichen sclerosus 92/42/68  ? Asymptomatic  ? Low iron   ? Mitral valve prolapse   ? Osteoarthritis   ? Osteoporosis   ? Pneumonia   ? Post herpetic neuralgia   ? Rheumatoid arthritis(714.0)   ? Yeast infection   ? ? ?Review of Systems ? ?Constitutional: No fever/chills ?Cardiovascular: Denies chest pain. ?Respiratory: Denies shortness of breath. ?Gastrointestinal: No abdominal pain.  No nausea, no vomiting.  No diarrhea.  No constipation. ?Genitourinary: Negative for dysuria. ?Musculoskeletal: Negative for back pain. Positive left shoulder pain.  ?Skin: Negative for rash. Positive laceration to the left lower leg.  ?Neurological: Negative for focal weakness or numbness. Positive HA.  ? ? ?____________________________________________ ? ? ?PHYSICAL EXAM: ? ?VITAL SIGNS: ?ED Triage Vitals  ?Enc Vitals Group  ?   BP 10/08/21 1931 (!) 167/98  ?   Pulse Rate 10/08/21 1931 80  ?   Resp 10/08/21 1931 16  ?   Temp 10/08/21 1931 98.8 ?F (37.1 ?C)  ?   Temp Source 10/08/21 1931 Oral  ?   SpO2 10/08/21 1931 97 %  ?    Weight 10/08/21 1933 133 lb 13.1 oz (60.7 kg)  ?   Height 10/08/21 1933 '5\' 6"'$  (1.676 m)  ? ?Constitutional: Alert and oriented. Well appearing and in no acute distress. ?Eyes: Conjunctivae are normal.  ?Head: hematoma with abrasion to the left forehead. No laceration.  ?Nose: No congestion/rhinnorhea. ?Mouth/Throat: Mucous membranes are moist.   ?Neck: No stridor.  No cervical spine tenderness to palpation. ?Cardiovascular: Normal rate, regular rhythm. Good peripheral circulation. Grossly normal heart sounds.   ?Respiratory: Normal respiratory effort.  No retractions. Lungs CTAB. ?Gastrointestinal: Soft and nontender. No distention.  ?Musculoskeletal: Pain with ROM of the left shoulder. No elbow or wrist tenderness. Normal ROM of the bilateral hips. No ankle or knee tenderness.  ?Neurologic:  Normal speech and language. No gross focal neurologic deficits are appreciated.  ?Skin:  Skin is warm and dry. Large "L" shaped laceration to the anterior left leg.  ? ?____________________________________________ ?  ?LABS ?(all labs ordered are listed, but only abnormal results are displayed) ? ?Labs Reviewed  ?COMPREHENSIVE METABOLIC PANEL - Abnormal; Notable for the following components:  ?    Result Value  ? Calcium 8.8 (*)   ? Total Protein 6.0 (*)   ? All other components within normal limits  ?CBC WITH DIFFERENTIAL/PLATELET - Abnormal; Notable for the following components:  ? Monocytes Absolute 1.2 (*)   ?  All other components within normal limits  ?URINALYSIS, ROUTINE W REFLEX MICROSCOPIC - Abnormal; Notable for the following components:  ? Leukocytes,Ua TRACE (*)   ? All other components within normal limits  ?URINALYSIS, MICROSCOPIC (REFLEX)  ?TROPONIN I (HIGH SENSITIVITY)  ?TROPONIN I (HIGH SENSITIVITY)  ? ?____________________________________________ ? ?EKG ? ? EKG Interpretation ? ?Date/Time:  Wednesday October 08 2021 19:42:08 EDT ?Ventricular Rate:  83 ?PR Interval:  159 ?QRS Duration: 94 ?QT Interval:  382 ?QTC  Calculation: 449 ?R Axis:   39 ?Text Interpretation: Sinus rhythm Multiform ventricular premature complexes Nonspecific T abnrm, anterolateral leads Confirmed by Nanda Quinton [phone]) on 10/08/2021 7:47:32 PM ?  ? ?  ? ? ?____________________________________________ ? ?RADIOLOGY ? ?No results found. ? ?____________________________________________ ? ? ?PROCEDURES ? ?Procedure(s) performed:  ? ?Marland Kitchen.Laceration Repair ? ?Date/Time: 10/08/2021 10:13 PM ?Performed by: Margette Fast, MD ?Authorized by: Margette Fast, MD  ? ?Consent:  ?  Consent obtained:  Verbal ?  Consent given by:  Patient ?  Risks, benefits, and alternatives were discussed: yes   ?  Risks discussed:  Infection, need for additional repair, nerve damage, poor wound healing, poor cosmetic result, pain, retained foreign body, tendon damage and vascular damage ?Universal protocol:  ?  Patient identity confirmed:  Verbally with patient ?Anesthesia:  ?  Anesthesia method:  Local infiltration ?  Local anesthetic:  Lidocaine 2% WITH epi ?Laceration details:  ?  Location:  Leg ?  Leg location:  L lower leg ?  Length (cm):  20 ?Pre-procedure details:  ?  Preparation:  Patient was prepped and draped in usual sterile fashion and imaging obtained to evaluate for foreign bodies ?Exploration:  ?  Limited defect created (wound extended): no   ?  Hemostasis achieved with:  Direct pressure ?  Imaging obtained: x-ray   ?  Imaging outcome: foreign body not noted   ?  Wound exploration: wound explored through full range of motion and entire depth of wound visualized   ?  Wound extent: no fascia violation noted, no foreign bodies/material noted, no muscle damage noted, no nerve damage noted, no tendon damage noted, no underlying fracture noted and no vascular damage noted   ?  Contaminated: no   ?Treatment:  ?  Area cleansed with:  Povidone-iodine and saline ?  Amount of cleaning:  Standard ?  Irrigation method:  Pressure wash ?  Debridement:  Minimal ?  Undermining:   Minimal ?Skin repair:  ?  Repair method:  Sutures ?  Suture size:  3-0 ?  Suture material:  Prolene ?  Suture technique:  Simple interrupted ?  Number of sutures:  25 ?Approximation:  ?  Approximation:  Loose ?Repair type:  ?  Repair type:  Complex ?Post-procedure details:  ?  Dressing:  Non-adherent dressing ?  Procedure completion:  Tolerated well, no immediate complications ? ? ?____________________________________________ ? ? ?INITIAL IMPRESSION / ASSESSMENT AND PLAN / ED COURSE ? ?Pertinent labs & imaging results that were available during my care of the patient were reviewed by me and considered in my medical decision making (see chart for details). ?  ?This patient is Presenting for Evaluation of head trauma, which does require a range of treatment options, and is a complaint that involves a high risk of morbidity and mortality. ? ?The Differential Diagnoses includes subdural hematoma, epidural hematoma, acute concussion, traumatic subarachnoid hemorrhage, cerebral contusions, etc. ? ? ?Critical Interventions-  ?  ?Medications  ?sodium chloride 0.9 % bolus 500 mL (0 mLs Intravenous  Stopped 10/08/21 2250)  ?lidocaine-EPINEPHrine (XYLOCAINE W/EPI) 2 %-1:200000 (PF) injection 20 mL (20 mLs Infiltration Given 10/08/21 1949)  ?doxycycline (VIBRA-TABS) tablet 100 mg (100 mg Oral Given 10/08/21 2314)  ? ? ?Reassessment after intervention: laceration repaired and hemostatic.  ? ? ?I did obtain Additional Historical Information from EMS and family at bedside. ? ?I decided to review pertinent External Data, and in summary no recent ED visits. ?  ?Clinical Laboratory Tests Ordered, included troponin negative. No AKI. UA without infection. No anemia.  ? ?Radiologic Tests Ordered, included CT head and c-spine along with plain films. I independently interpreted the images and agree with radiology interpretation.  ? ?Cardiac Monitor Tracing which shows NSR. ? ? ?Social Determinants of Health Risk patient without a smoking  history.  ? ?Medical Decision Making: Summary:  ?Patient presents emergency department after mechanical fall.  Daughter describes some unsteadiness prior to falling.  Will obtain blood work and EKG in the setting.  CT i

## 2021-10-10 ENCOUNTER — Encounter (HOSPITAL_BASED_OUTPATIENT_CLINIC_OR_DEPARTMENT_OTHER): Payer: Self-pay | Admitting: Cardiology

## 2021-10-10 DIAGNOSIS — M810 Age-related osteoporosis without current pathological fracture: Secondary | ICD-10-CM | POA: Diagnosis not present

## 2021-10-10 DIAGNOSIS — I10 Essential (primary) hypertension: Secondary | ICD-10-CM | POA: Diagnosis not present

## 2021-10-10 DIAGNOSIS — E785 Hyperlipidemia, unspecified: Secondary | ICD-10-CM | POA: Diagnosis not present

## 2021-10-10 DIAGNOSIS — J449 Chronic obstructive pulmonary disease, unspecified: Secondary | ICD-10-CM | POA: Diagnosis not present

## 2021-10-12 DIAGNOSIS — R42 Dizziness and giddiness: Secondary | ICD-10-CM | POA: Diagnosis not present

## 2021-10-12 DIAGNOSIS — B0223 Postherpetic polyneuropathy: Secondary | ICD-10-CM | POA: Diagnosis not present

## 2021-10-12 DIAGNOSIS — J849 Interstitial pulmonary disease, unspecified: Secondary | ICD-10-CM | POA: Diagnosis not present

## 2021-10-12 DIAGNOSIS — S81812D Laceration without foreign body, left lower leg, subsequent encounter: Secondary | ICD-10-CM | POA: Diagnosis not present

## 2021-10-12 DIAGNOSIS — R296 Repeated falls: Secondary | ICD-10-CM | POA: Diagnosis not present

## 2021-10-12 DIAGNOSIS — E785 Hyperlipidemia, unspecified: Secondary | ICD-10-CM | POA: Diagnosis not present

## 2021-10-12 DIAGNOSIS — D692 Other nonthrombocytopenic purpura: Secondary | ICD-10-CM | POA: Diagnosis not present

## 2021-10-12 DIAGNOSIS — M810 Age-related osteoporosis without current pathological fracture: Secondary | ICD-10-CM | POA: Diagnosis not present

## 2021-10-12 DIAGNOSIS — I771 Stricture of artery: Secondary | ICD-10-CM | POA: Diagnosis not present

## 2021-10-12 DIAGNOSIS — I872 Venous insufficiency (chronic) (peripheral): Secondary | ICD-10-CM | POA: Diagnosis not present

## 2021-10-12 DIAGNOSIS — J4540 Moderate persistent asthma, uncomplicated: Secondary | ICD-10-CM | POA: Diagnosis not present

## 2021-10-12 DIAGNOSIS — J449 Chronic obstructive pulmonary disease, unspecified: Secondary | ICD-10-CM | POA: Diagnosis not present

## 2021-10-12 DIAGNOSIS — Z8616 Personal history of COVID-19: Secondary | ICD-10-CM | POA: Diagnosis not present

## 2021-10-12 DIAGNOSIS — Z4800 Encounter for change or removal of nonsurgical wound dressing: Secondary | ICD-10-CM | POA: Diagnosis not present

## 2021-10-12 DIAGNOSIS — M80012D Age-related osteoporosis with current pathological fracture, left shoulder, subsequent encounter for fracture with routine healing: Secondary | ICD-10-CM | POA: Diagnosis not present

## 2021-10-12 DIAGNOSIS — J9621 Acute and chronic respiratory failure with hypoxia: Secondary | ICD-10-CM | POA: Diagnosis not present

## 2021-10-12 DIAGNOSIS — H532 Diplopia: Secondary | ICD-10-CM | POA: Diagnosis not present

## 2021-10-12 DIAGNOSIS — S71102D Unspecified open wound, left thigh, subsequent encounter: Secondary | ICD-10-CM | POA: Diagnosis not present

## 2021-10-12 DIAGNOSIS — I10 Essential (primary) hypertension: Secondary | ICD-10-CM | POA: Diagnosis not present

## 2021-10-12 DIAGNOSIS — K219 Gastro-esophageal reflux disease without esophagitis: Secondary | ICD-10-CM | POA: Diagnosis not present

## 2021-10-12 DIAGNOSIS — K5900 Constipation, unspecified: Secondary | ICD-10-CM | POA: Diagnosis not present

## 2021-10-12 DIAGNOSIS — I700 Atherosclerosis of aorta: Secondary | ICD-10-CM | POA: Diagnosis not present

## 2021-10-12 DIAGNOSIS — F419 Anxiety disorder, unspecified: Secondary | ICD-10-CM | POA: Diagnosis not present

## 2021-10-12 DIAGNOSIS — M0510 Rheumatoid lung disease with rheumatoid arthritis of unspecified site: Secondary | ICD-10-CM | POA: Diagnosis not present

## 2021-10-12 DIAGNOSIS — N393 Stress incontinence (female) (male): Secondary | ICD-10-CM | POA: Diagnosis not present

## 2021-10-15 DIAGNOSIS — S42002A Fracture of unspecified part of left clavicle, initial encounter for closed fracture: Secondary | ICD-10-CM | POA: Diagnosis not present

## 2021-10-16 ENCOUNTER — Encounter (HOSPITAL_BASED_OUTPATIENT_CLINIC_OR_DEPARTMENT_OTHER): Payer: Medicare Other | Attending: Internal Medicine | Admitting: Internal Medicine

## 2021-10-16 DIAGNOSIS — L97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 with unspecified severity: Secondary | ICD-10-CM | POA: Diagnosis not present

## 2021-10-16 DIAGNOSIS — S80812A Abrasion, left lower leg, initial encounter: Secondary | ICD-10-CM | POA: Diagnosis not present

## 2021-10-16 DIAGNOSIS — M069 Rheumatoid arthritis, unspecified: Secondary | ICD-10-CM

## 2021-10-16 DIAGNOSIS — S7002XD Contusion of left hip, subsequent encounter: Secondary | ICD-10-CM | POA: Insufficient documentation

## 2021-10-16 DIAGNOSIS — X58XXXA Exposure to other specified factors, initial encounter: Secondary | ICD-10-CM | POA: Diagnosis not present

## 2021-10-16 DIAGNOSIS — T798XXA Other early complications of trauma, initial encounter: Secondary | ICD-10-CM

## 2021-10-16 NOTE — Progress Notes (Signed)
Stacey [name]. ([id number]) ?Visit Report for 10/16/2021 ?Chief Complaint Document Details ?Patient Name: Date of Service: ?Stacey [name], Stacey [name]. 10/16/2021 8:15 [name] ?Medical Record Number: [id number] ?Patient Account Number: [id number] ?Date of Birth/Sex: Treating RN: ?18-Oct-1940 (81 y.o. Stacey [name] ?Primary Care Provider: Shon [name] Other Clinician: ?Referring Provider: ?Treating Provider/Extender: Stacey [name] ?Stacey [name] ?Stacey [name] in Treatment: 0 ?Information Obtained from: Patient ?Chief Complaint ?10/16/2021; left lower extremity wounds following trauma ?Electronic Signature(s) ?Signed: 10/16/2021 12:11:35 PM By: Stacey Shan DO ?Entered By: Stacey [name] on 10/16/2021 09:58:28 ?-------------------------------------------------------------------------------- ?Debridement Details ?Patient Name: Date of Service: ?Stacey [name], Stacey [name]. 10/16/2021 8:15 [name] ?Medical Record Number: [id number] ?Patient Account Number: [id number] ?Date of Birth/Sex: Treating RN: ?07/30/1940 (81 y.o. F) Stacey [name] ?Primary Care Provider: Shon [name] Other Clinician: ?Referring Provider: ?Treating Provider/Extender: Stacey [name] ?Stacey [name] ?Stacey [name] in Treatment: 0 ?Debridement Performed for Assessment: Wound #4 Left,Anterior Lower Leg ?Performed By: Clinician Stacey Creamer, RN ?Debridement Type: Chemical/Enzymatic/Mechanical ?Agent Used: wound cleanser and gauze ?Level of Consciousness (Pre-procedure): Awake and Alert ?Pre-procedure Verification/Time Out No ?Taken: ?Bleeding: None ?Response to Treatment: Procedure was tolerated well ?Level of Consciousness (Post- Awake and Alert ?procedure): ?Post Debridement Measurements of Total Wound ?Length: (cm) 11 ?Width: (cm) 4.7 ?Depth: (cm) 0.2 ?Volume: (cm?) 8.121 ?Character of Wound/Ulcer Post Debridement: Improved ?Post Procedure Diagnosis ?Same as Pre-procedure ?Electronic Signature(s) ?Signed: 10/16/2021 4:31:51 PM By: Stacey Creamer RN, BSN ?Signed: 10/16/2021 4:54:00 PM  By: Stacey Shan DO ?Entered By: Stacey [name] on 10/16/2021 16:03:16 ?-------------------------------------------------------------------------------- ?Debridement Details ?Patient Name: Date of Service: ?Stacey [name], Stacey [name]. 10/16/2021 8:15 [name] ?Medical Record Number: [id number] ?Patient Account Number: [id number] ?Date of Birth/Sex: Treating RN: ?1941/02/17 (81 y.o. F) Stacey [name] ?Primary Care Provider: Shon [name] Other Clinician: ?Referring Provider: ?Treating Provider/Extender: Stacey [name] ?Stacey [name] ?Stacey [name] in Treatment: 0 ?Debridement Performed for Assessment: Wound #5 Left,Lateral Upper Leg ?Performed By: Clinician Stacey Creamer, RN ?Debridement Type: Chemical/Enzymatic/Mechanical ?Agent Used: wound cleanser and gauze ?Level of Consciousness (Pre-procedure): Awake and Alert ?Pre-procedure Verification/Time Out No ?Taken: ?Bleeding: None ?Response to Treatment: Procedure was tolerated well ?Level of Consciousness (Post- Awake and Alert ?procedure): ?Post Debridement Measurements of Total Wound ?Length: (cm) 1.5 ?Width: (cm) 1.3 ?Depth: (cm) 0.1 ?Volume: (cm?) 0.153 ?Character of Wound/Ulcer Post Debridement: Improved ?Post Procedure Diagnosis ?Same as Pre-procedure ?Electronic Signature(s) ?Signed: 10/16/2021 4:31:51 PM By: Stacey Creamer RN, BSN ?Signed: 10/16/2021 4:54:00 PM By: Stacey Shan DO ?Entered By: Stacey [name] on 10/16/2021 16:04:29 ?-------------------------------------------------------------------------------- ?HPI Details ?Patient Name: Date of Service: ?Stacey [name], Stacey [name]. 10/16/2021 8:15 [name] ?Medical Record Number: [id number] ?Patient Account Number: [id number] ?Date of Birth/Sex: Treating RN: ?June 05, 1941 (81 y.o. Stacey [name] ?Primary Care Provider: Shon [name] Other Clinician: ?Referring Provider: ?Treating Provider/Extender: Stacey [name] ?Stacey [name] ?Stacey [name] in Treatment: 0 ?History of Present Illness ?HPI Description: Admission 10/16/2021 ?Stacey [name] is an  81 year old female with a past medical history of rheumatoid arthritis, interstitial lung disease that presents to the clinic for a 1 week ?history of wounds to her left lower extremity following a fall. She visited the ED following the trauma. She had 25 sutures placed to the left lower extremity ?wound. She also reports a hematoma to the left lateral thigh with an abrasion mark. She was started on doxycycline in the ED and she completed this. She ?currently denies signs of infection. ?Electronic Signature(s) ?Signed: 10/16/2021 12:11:35 PM By: Stacey Shan DO ?Entered By: Stacey [name] on 10/16/2021 10:00:33 ?-------------------------------------------------------------------------------- ?Physical Exam Details ?Patient Name: Date of  Service: ?Stacey [name], Stacey [name]. 10/16/2021 8:15 [name] ?Medical Record Number: [id number] ?Patient Account Number: [id number] ?Date of Birth/Sex: Treating RN: ?Sep 10, 1940 (81 y.o. Stacey [name] ?Primary Care Provider: Shon [name] Other Clinician: ?Referring Provider: ?Treating Provider/Extender: Stacey [name] ?Stacey [name] ?Stacey [name] in Treatment: 0 ?Constitutional ?respirations regular, non-labored and within target range for patient.Marland Kitchen ?Cardiovascular ?2+ dorsalis pedis/posterior tibialis pulses. ?Psychiatric ?pleasant and cooperative. ?Notes ?Left lower extremity: T the distal anterior aspect there is a wound with the edges approximated with sutures. Around the incision site the tissue appears ?o ?nonviable. There are no signs of infection. T the lateral thigh there is a hematoma without signs of infection. There is an area of skin breakdown over the ?o ?hematoma with no drainage. ?Electronic Signature(s) ?Signed: 10/16/2021 12:11:35 PM By: Stacey Shan DO ?Entered By: Stacey [name] on 10/16/2021 10:02:51 ?-------------------------------------------------------------------------------- ?Physician Orders Details ?Patient Name: Date of Service: ?Stacey [name], Stacey [name]. 10/16/2021  8:15 [name] ?Medical Record Number: [id number] ?Patient Account Number: [id number] ?Date of Birth/Sex: Treating RN: ?[birth year] (81 y.o. F) Stacey [name] ?Primary Care Provider: Shon [name] Other Clinician: ?Referring Provider: ?Treating Provider/Extender: Stacey [name] ?Stacey [name] ?Stacey [name] in Treatment: 0 ?Verbal / Phone Orders: No ?Diagnosis Coding ?Follow-up Appointments ?ppointment in 1 week. - Dr. Heber [place] ?Return A ?Bathing/ Shower/ Hygiene ?May shower and wash wound with soap and water. ?Edema Control - Lymphedema / SCD / Other ?Elevate legs to the level of the heart or above for 30 minutes daily and/or when sitting, a frequency of: - elevate legs when sitting ?Avoid standing for long periods of time. ?Moisturize legs daily. - keep skin soft with moisturizer, do not get moisturizer on wound bed ?Additional Orders / Instructions ?Follow Nutritious Diet - increase protein intake ?Wound Treatment ?Wound #4 - Lower Leg Wound Laterality: Left, Anterior ?Cleanser: Soap and Water 1 x Per Day/30 Days ?Discharge Instructions: May shower and wash wound with dial antibacterial soap and water prior to dressing change. ?Topical: Gentamicin 1 x Per Day/30 Days ?Discharge Instructions: apply to wound bed at dressing change ?Prim Dressing: Xeroform Occlusive Gauze Dressing, 4x4 in (DME) (Generic) 1 x Per Day/30 Days ?ary ?Discharge Instructions: Apply to wound bed at dressing change ?Secondary Dressing: Bordered Gauze, 4x4 in (DME) (Generic) 1 x Per Day/30 Days ?Discharge Instructions: Apply over primary dressing as directed. ?Wound #5 - Upper Leg Wound Laterality: Left, Lateral ?Cleanser: Soap and Water 1 x Per Day/30 Days ?Discharge Instructions: May shower and wash wound with dial antibacterial soap and water prior to dressing change. ?Topical: Gentamicin 1 x Per Day/30 Days ?Discharge Instructions: apply to wound bed at dressing change ?Prim Dressing: Xeroform Occlusive Gauze Dressing, 4x4 in (Generic) 1 x Per Day/30  Days ?ary ?Discharge Instructions: Apply to wound bed at dressing change ?Secondary Dressing: Bordered Gauze, 4x4 in (DME) (Generic) 1 x Per Day/30 Days ?Discharge Instructions: Apply over primary dressing a

## 2021-10-16 NOTE — Progress Notes (Signed)
Stacey [name], [place]. ([id number]) ?Visit Report for 10/16/2021 ?Abuse Risk Screen Details ?Patient Name: Date of Service: ?[name], [name] 10/16/2021 8:15 A M ?Medical Record Number: [id number] ?Patient Account Number: [id number] ?Date of Birth/Sex: Treating RN: ?06-19-1941 (81 y.o. F) Sharyn Creamer ?Primary Care Katlynn Naser: Shon Baton Other Clinician: ?Referring Cresencia Asmus: ?Treating Kamela Blansett/Extender: Kalman Shan ?[name], [name] ?Weeks in Treatment: 0 ?Abuse Risk Screen Items ?Answer ?ABUSE RISK SCREEN: ?Has anyone close to you tried to hurt or harm you recentlyo No ?Do you feel uncomfortable with anyone in your familyo No ?Has anyone forced you do things that you didnt want to doo No ?Electronic Signature(s) ?Signed: 10/16/2021 4:31:51 PM By: Sharyn Creamer RN, BSN ?Entered By: Sharyn Creamer on 10/16/2021 08:58:44 ?-------------------------------------------------------------------------------- ?Activities of Daily Living Details ?Patient Name: Date of Service: ?[name], [name] 10/16/2021 8:15 A M ?Medical Record Number: [id number] ?Patient Account Number: [id number] ?Date of Birth/Sex: Treating RN: ?07/03/1941 (81 y.o. F) Sharyn Creamer ?Primary Care Theodosia Bahena: Shon Baton Other Clinician: ?Referring Jomes Giraldo: ?Treating Nathian Stencil/Extender: Kalman Shan ?[name], [name] ?Weeks in Treatment: 0 ?Activities of Daily Living Items ?Answer ?Activities of Daily Living (Please select one for each item) ?Drive Automobile Need Assistance ?T Medications ?ake Completely Able ?Use T elephone Completely Able ?Care for Appearance Completely Able ?Use T oilet Completely Able ?Bath / Shower Completely Able ?Dress Self Completely Able ?Feed Self Completely Able ?Walk Completely Able ?Get In / Out Bed Completely Able ?Housework Completely Able ?Prepare Meals Completely Able ?Handle Money Completely Able ?Shop for Self Need Assistance ?Electronic Signature(s) ?Signed: 10/16/2021 4:31:51 PM By: Sharyn Creamer RN, BSN ?Entered By: Sharyn Creamer on 10/16/2021 08:59:52 ?-------------------------------------------------------------------------------- ?Education Screening Details ?Patient Name: ?Date of Service: ?[name], [name] 10/16/2021 8:15 A M ?Medical Record Number: [id number] ?Patient Account Number: [id number] ?Date of Birth/Sex: ?Treating RN: ?Jul 24, 1940 (81 y.o. F) Sharyn Creamer ?Primary Care Kruze Atchley: Shon Baton ?Other Clinician: ?Referring Cashtyn Pouliot: ?Treating Miski Feldpausch/Extender: Kalman Shan ?[name], [name] ?Weeks in Treatment: 0 ?Primary Learner Assessed: Patient ?Learning Preferences/Education Level/Primary Language ?Learning Preference: Explanation, Demonstration, Printed Material ?Highest Education Level: College or Above ?Preferred Language: English ?Cognitive Barrier ?Language Barrier: No ?Translator Needed: No ?Memory Deficit: No ?Emotional Barrier: No ?Cultural/Religious Beliefs Affecting Medical Care: No ?Physical Barrier ?Impaired Vision: No ?Impaired Hearing: No ?Decreased Hand dexterity: No ?Knowledge/Comprehension ?Knowledge Level: High ?Comprehension Level: High ?Ability to understand written instructions: High ?Ability to understand verbal instructions: High ?Motivation ?Anxiety Level: Calm ?Cooperation: Cooperative ?Education Importance: Acknowledges Need ?Interest in Health Problems: Asks Questions ?Perception: Coherent ?Willingness to Engage in Self-Management High ?Activities: ?Readiness to Engage in Self-Management High ?Activities: ?Electronic Signature(s) ?Signed: 10/16/2021 4:31:51 PM By: Sharyn Creamer RN, BSN ?Entered By: Sharyn Creamer on 10/16/2021 09:00:31 ?-------------------------------------------------------------------------------- ?Fall Risk Assessment Details ?Patient Name: ?Date of Service: ?Thrush, [name] 10/16/2021 8:15 A M ?Medical Record Number: [id number] ?Patient Account Number: [id number] ?Date of Birth/Sex: ?Treating RN: ?13-Sep-1940 (81 y.o. F) Sharyn Creamer ?Primary Care Neyah Ellerman: Shon Baton ?Other Clinician: ?Referring Alvina Strother: ?Treating Valiant Dills/Extender: Kalman Shan ?[name], [name] ?Weeks in Treatment: 0 ?Fall Risk Assessment Items ?Have you had 2 or more falls in the last 12 monthso 0 Yes ?Have you had any fall that resulted in injury in the last 12 monthso 0 Yes ?FALLS RISK SCREEN ?History of falling - immediate or within 3 months 25 Yes ?Secondary diagnosis (Do you have 2 or more medical diagnoseso) 0 No ?Ambulatory aid ?None/bed rest/wheelchair/nurse 0 No ?Crutches/cane/walker 15 Yes ?Furniture 0 No ?Intravenous therapy Access/Saline/Heparin Lock 0 No ?Gait/Transferring ?Normal/  bed rest/ wheelchair 0 Yes ?Weak (short steps with or without shuffle, stooped but able to lift head while walking, may seek 0 No ?support from furniture) ?Impaired (short steps with shuffle, may have difficulty arising from chair, head down, impaired 0 No ?balance) ?Mental Status ?Oriented to own ability 0 Yes ?Electronic Signature(s) ?Signed: 10/16/2021 4:31:51 PM By: Sharyn Creamer RN, BSN ?Entered By: Sharyn Creamer on 10/16/2021 09:01:24 ?-------------------------------------------------------------------------------- ?Foot Assessment Details ?Patient Name: ?Date of Service: ?[name], [name] 10/16/2021 8:15 A M ?Medical Record Number: [id number] ?Patient Account Number: [id number] ?Date of Birth/Sex: ?Treating RN: ?10/16/1940 (81 y.o. F) Sharyn Creamer ?Primary Care Madeeha Costantino: Shon Baton ?Other Clinician: ?Referring Shanyce Daris: ?Treating Kenji Mapel/Extender: Kalman Shan ?[name], [name] ?Weeks in Treatment: 0 ?Foot Assessment Items ?Site Locations ?+ = Sensation present, - = Sensation absent, C = Callus, U = Ulcer ?R = Redness, W = Warmth, M = Maceration, PU = Pre-ulcerative lesion ?F = Fissure, S = Swelling, D = Dryness ?Assessment ?Right: Left: ?Other Deformity: No No ?Prior Foot Ulcer: No No ?Prior Amputation: No No ?Charcot Joint: No No ?Ambulatory Status: ?Gait: ?Electronic Signature(s) ?Signed: 10/16/2021  4:31:51 PM By: Sharyn Creamer RN, BSN ?Entered By: Sharyn Creamer on 10/16/2021 09:06:00 ?-------------------------------------------------------------------------------- ?Nutrition Risk Screening Details ?Patient Name: ?Date of Service: ?Michels, [name] 10/16/2021 8:15 A M ?Medical Record Number: [id number] ?Patient Account Number: [id number] ?Date of Birth/Sex: ?Treating RN: ?1941/02/10 (81 y.o. F) Sharyn Creamer ?Primary Care Ellison Leisure: Shon Baton ?Other Clinician: ?Referring Evynn Boutelle: ?Treating Elexa Kivi/Extender: Kalman Shan ?[name], [name] ?Weeks in Treatment: 0 ?Height (in): 65 ?Weight (lbs): 133 ?Body Mass Index (BMI): 22.1 ?Nutrition Risk Screening Items ?Score Screening ?NUTRITION RISK SCREEN: ?I have an illness or condition that made me change the kind and/or amount of food I eat 0 No ?I eat fewer than two meals per day 0 No ?I eat few fruits and vegetables, or milk products 0 No ?I have three or more drinks of beer, liquor or wine almost every day 0 No ?I have tooth or mouth problems that make it hard for me to eat 0 No ?I don't always have enough money to buy the food I need 0 No ?I eat alone most of the time 0 No ?I take three or more different prescribed or over-the-counter drugs a day 0 No ?Without wanting to, I have lost or gained 10 pounds in the last six months 0 No ?I am not always physically able to shop, cook and/or feed myself 0 No ?Nutrition Protocols ?Good Risk Protocol ?Moderate Risk Protocol ?High Risk Proctocol ?Risk Level: Good Risk ?Score: 0 ?Electronic Signature(s) ?Signed: 10/16/2021 4:31:51 PM By: Sharyn Creamer RN, BSN ?Entered By: Sharyn Creamer on 10/16/2021 09:01:46 ?

## 2021-10-17 DIAGNOSIS — M80012D Age-related osteoporosis with current pathological fracture, left shoulder, subsequent encounter for fracture with routine healing: Secondary | ICD-10-CM | POA: Diagnosis not present

## 2021-10-17 DIAGNOSIS — S71102D Unspecified open wound, left thigh, subsequent encounter: Secondary | ICD-10-CM | POA: Diagnosis not present

## 2021-10-17 DIAGNOSIS — J849 Interstitial pulmonary disease, unspecified: Secondary | ICD-10-CM | POA: Diagnosis not present

## 2021-10-17 DIAGNOSIS — S81812D Laceration without foreign body, left lower leg, subsequent encounter: Secondary | ICD-10-CM | POA: Diagnosis not present

## 2021-10-17 DIAGNOSIS — M0510 Rheumatoid lung disease with rheumatoid arthritis of unspecified site: Secondary | ICD-10-CM | POA: Diagnosis not present

## 2021-10-17 DIAGNOSIS — R296 Repeated falls: Secondary | ICD-10-CM | POA: Diagnosis not present

## 2021-10-20 ENCOUNTER — Other Ambulatory Visit: Payer: Self-pay | Admitting: Internal Medicine

## 2021-10-20 DIAGNOSIS — M359 Systemic involvement of connective tissue, unspecified: Secondary | ICD-10-CM

## 2021-10-20 DIAGNOSIS — J8489 Other specified interstitial pulmonary diseases: Secondary | ICD-10-CM

## 2021-10-23 ENCOUNTER — Encounter (HOSPITAL_BASED_OUTPATIENT_CLINIC_OR_DEPARTMENT_OTHER): Payer: Medicare Other | Admitting: Internal Medicine

## 2021-10-23 DIAGNOSIS — M069 Rheumatoid arthritis, unspecified: Secondary | ICD-10-CM | POA: Diagnosis not present

## 2021-10-23 DIAGNOSIS — L97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 with unspecified severity: Secondary | ICD-10-CM | POA: Diagnosis not present

## 2021-10-23 DIAGNOSIS — L97122 Non-pressure chronic ulcer of left thigh with fat layer exposed: Secondary | ICD-10-CM | POA: Diagnosis not present

## 2021-10-23 DIAGNOSIS — L97822 Non-pressure chronic ulcer of other part of left lower leg with fat layer exposed: Secondary | ICD-10-CM | POA: Diagnosis not present

## 2021-10-23 DIAGNOSIS — S7002XD Contusion of left hip, subsequent encounter: Secondary | ICD-10-CM | POA: Diagnosis not present

## 2021-10-23 DIAGNOSIS — S80812A Abrasion, left lower leg, initial encounter: Secondary | ICD-10-CM | POA: Diagnosis not present

## 2021-10-23 DIAGNOSIS — T798XXA Other early complications of trauma, initial encounter: Secondary | ICD-10-CM | POA: Diagnosis not present

## 2021-10-24 NOTE — Progress Notes (Signed)
Stacey [name], Stacey [name]. ([id number]) ?Visit Report for 10/23/2021 ?Arrival Information Details ?Patient Name: Date of Service: ?Beers, [name] 10/23/2021 1:15 PM ?Medical Record Number: [id number] ?Patient Account Number: [id number] ?Date of Birth/Sex: Treating RN: ?[birth year] (81 y.o. Stacey [name], Stacey ?Primary Care Stacey [name]: Shon [name] Other Clinician: ?Referring Stacey [name]: ?Treating Stacey [name]/Extender: Stacey [name] ?Shon [name] ?Stacey [name]: 1 ?Visit Information History Since Last Visit ?Added or deleted any medications: No ?Patient Arrived: Stacey [name] ?Any new allergies or adverse reactions: No ?Arrival Time: 13:44 ?Had a fall or experienced change in No ?Accompanied By: self ?activities of daily living that may affect ?Transfer Assistance: Manual ?risk of falls: ?Patient Identification Verified: Yes ?Signs or symptoms of abuse/neglect since last visito No ?Secondary Verification Process Completed: Yes ?Hospitalized since last visit: No ?Patient Requires Transmission-Based Precautions: No ?Implantable device outside of the clinic excluding No ?cellular tissue based products placed in the center ?since last visit: ?Has Dressing in Place as Prescribed: Yes ?Pain Present Now: Yes ?Electronic Signature(s) ?Signed: 10/24/2021 12:54:41 PM By: Stacey Hammock RN ?Entered By: Stacey [name] on 10/23/2021 13:44:28 ?-------------------------------------------------------------------------------- ?Clinic Level of Care Assessment Details ?Patient Name: Date of Service: ?[name], [name] 10/23/2021 1:15 PM ?Medical Record Number: [id number] ?Patient Account Number: [id number] ?Date of Birth/Sex: Treating RN: ?[birth year] (81 y.o. Stacey [name], Stacey ?Primary Care Jaymarion Trombly: Shon [name] Other Clinician: ?Referring Aleane Wesenberg: ?Treating Jasmin Trumbull/Extender: Stacey [name] ?Shon [name] ?Stacey [name]: 1 ?Clinic Level of Care Assessment Items ?TOOL 4 Quantity Score ?X- 1 0 ?Use when only an EandM is performed on  FOLLOW-UP visit ?ASSESSMENTS - Nursing Assessment / Reassessment ?X- 1 10 ?Reassessment of Co-morbidities (includes updates in patient status) ?X- 1 5 ?Reassessment of Adherence to [name] Plan ?ASSESSMENTS - Wound and Skin A ssessment / Reassessment ?'[]'  - 0 ?Simple Wound Assessment / Reassessment - one wound ?X- 2 5 ?Complex Wound Assessment / Reassessment - multiple wounds ?'[]'  - 0 ?Dermatologic / Skin Assessment (not related to wound area) ?ASSESSMENTS - Focused Assessment ?X- 1 5 ?Circumferential Edema Measurements - multi extremities ?'[]'  - 0 ?Nutritional Assessment / Counseling / Intervention ?'[]'  - 0 ?Lower Extremity Assessment (monofilament, tuning fork, pulses) ?'[]'  - 0 ?Peripheral Arterial Disease Assessment (using hand held doppler) ?ASSESSMENTS - Ostomy and/or Continence Assessment and Care ?'[]'  - 0 ?Incontinence Assessment and Management ?'[]'  - 0 ?Ostomy Care Assessment and Management (repouching, etc.) ?PROCESS - Coordination of Care ?'[]'  - 0 ?Simple Patient / Family Education for ongoing care ?X- 1 20 ?Complex (extensive) Patient / Family Education for ongoing care ?X- 1 10 ?Staff obtains Consents, Records, T Results / Process Orders ?est ?'[]'  - 0 ?Staff telephones HHA, Nursing Homes / Clarify orders / etc ?'[]'  - 0 ?Routine Transfer to another Facility (non-emergent condition) ?'[]'  - 0 ?Routine Hospital Admission (non-emergent condition) ?'[]'  - 0 ?New Admissions / Biomedical engineer / Ordering NPWT Apligraf, etc. ?, ?'[]'  - 0 ?Emergency Hospital Admission (emergent condition) ?'[]'  - 0 ?Simple Discharge Coordination ?X- 1 15 ?Complex (extensive) Discharge Coordination ?PROCESS - Special Needs ?'[]'  - 0 ?Pediatric / Minor Patient Management ?'[]'  - 0 ?Isolation Patient Management ?'[]'  - 0 ?Hearing / Language / Visual special needs ?'[]'  - 0 ?Assessment of Community assistance (transportation, D/C planning, etc.) ?'[]'  - 0 ?Additional assistance / Altered mentation ?'[]'  - 0 ?Support Surface(s) Assessment (bed, cushion,  seat, etc.) ?INTERVENTIONS - Wound Cleansing / Measurement ?'[]'  - 0 ?Simple Wound Cleansing - one wound ?X- 2 5 ?Complex Wound Cleansing - multiple wounds ?X- 1 5 ?Wound Imaging (photographs -  any number of wounds) ?'[]'  - 0 ?Wound Tracing (instead of photographs) ?'[]'  - 0 ?Simple Wound Measurement - one wound ?X- 2 5 ?Complex Wound Measurement - multiple wounds ?INTERVENTIONS - Wound Dressings ?'[]'  - 0 ?Small Wound Dressing one or multiple wounds ?X- 2 15 ?Medium Wound Dressing one or multiple wounds ?'[]'  - 0 ?Large Wound Dressing one or multiple wounds ?'[]'  - 0 ?Application of Medications - topical ?'[]'  - 0 ?Application of Medications - injection ?INTERVENTIONS - Miscellaneous ?'[]'  - 0 ?External ear exam ?'[]'  - 0 ?Specimen Collection (cultures, biopsies, blood, body fluids, etc.) ?'[]'  - 0 ?Specimen(s) / Culture(s) sent or taken to Lab for analysis ?'[]'  - 0 ?Patient Transfer (multiple staff / Civil Service fast streamer / Similar devices) ?'[]'  - 0 ?Simple Staple / Suture removal (25 or less) ?'[]'  - 0 ?Complex Staple / Suture removal (26 or more) ?'[]'  - 0 ?Hypo / Hyperglycemic Management (close monitor of Blood Glucose) ?'[]'  - 0 ?Ankle / Brachial Index (ABI) - do not check if billed separately ?X- 1 5 ?Vital Signs ?Has the patient been seen at the hospital within the last three years: Yes ?Total Score: 135 ?Level Of Care: New/Established - Level 4 ?Electronic Signature(s) ?Signed: 10/24/2021 12:54:41 PM By: Stacey Hammock RN ?Entered By: Stacey [name] on 10/23/2021 17:15:50 ?-------------------------------------------------------------------------------- ?Encounter Discharge Information Details ?Patient Name: Date of Service: ?[name], [name] 10/23/2021 1:15 PM ?Medical Record Number: [id number] ?Patient Account Number: [id number] ?Date of Birth/Sex: Treating RN: ?07/18/1940 (81 y.o. Stacey [name], Stacey ?Primary Care Leondro Coryell: Shon [name] Other Clinician: ?Referring Matheo Rathbone: ?Treating Ramla Hase/Extender: Stacey [name] ?Shon [name] ?Stacey  in [name]: 1 ?Encounter Discharge Information Items ?Discharge Condition: Stable ?Ambulatory Status: Stacey [name] ?Discharge Destination: Home ?Transportation: Private Auto ?Accompanied By: Harrel Carina ?Schedule Follow-up Appointment: Yes ?Clinical Summary of Care: Patient Declined ?Electronic Signature(s) ?Signed: 10/24/2021 12:54:41 PM By: Stacey Hammock RN ?Entered By: Stacey [name] on 10/23/2021 17:16:43 ?-------------------------------------------------------------------------------- ?Lower Extremity Assessment Details ?Patient Name: Date of Service: ?Castelo, [name] 10/23/2021 1:15 PM ?Medical Record Number: [id number] ?Patient Account Number: [id number] ?Date of Birth/Sex: Treating RN: ?May 27, 1941 (81 y.o. Stacey [name], Stacey ?Primary Care Kaidan Spengler: Shon [name] Other Clinician: ?Referring Joshawn Crissman: ?Treating Yamel Bale/Extender: Stacey [name] ?Shon [name] ?Stacey [name]: 1 ?Edema Assessment ?Assessed: [Left: Yes] [Right: No] ?Edema: [Left: Ye] [Right: s] ?Calf ?Left: Right: ?Point of Measurement: 32 cm From Medial Instep 31 cm ?Ankle ?Left: Right: ?Point of Measurement: 11 cm From Medial Instep 18.6 cm ?Electronic Signature(s) ?Signed: 10/24/2021 12:54:41 PM By: Stacey Hammock RN ?Entered By: Stacey [name] on 10/23/2021 13:48:19 ?-------------------------------------------------------------------------------- ?Multi-Disciplinary Care Plan Details ?Patient Name: ?Date of Service: ?Leflore, [name] 10/23/2021 1:15 PM ?Medical Record Number: [id number] ?Patient Account Number: [id number] ?Date of Birth/Sex: ?Treating RN: ?Feb 20, 1941 (81 y.o. Stacey [name], Stacey ?Primary Care Natilee Gauer: Shon [name] ?Other Clinician: ?Referring Erby Sanderson: ?Treating Curtis Uriarte/Extender: Stacey [name] ?Shon [name] ?Stacey [name]: 1 ?Active Inactive ?Abuse / Safety / Falls / Self Care Management ?Nursing Diagnoses: ?History of Falls ?Potential for injury related to falls ?Goals: ?Patient will remain injury free  related to falls ?Date Initiated: 10/16/2021 ?Target Resolution Date: 11/13/2021 ?Goal Status: Active ?Interventions: ?Provide education on fall prevention ?Notes: ?Nutrition ?Nursing Diagnoses: ?Potential for a

## 2021-10-24 NOTE — Progress Notes (Signed)
Stacey, [name]. ([id number]) ?Visit Report for 10/23/2021 ?HPI Details ?Patient Name: Date of Service: ?Stacey [name], Stacey [name]. 10/23/2021 1:15 PM ?Medical Record Number: [id number] ?Patient Account Number: [id number] ?Date of Birth/Sex: Treating RN: ?07/05/1941 (81 y.o. [name], [name] ?Primary Care Provider: Shon Baton Other Clinician: ?Referring Provider: ?Treating Provider/Extender: Linton Ham ?Shon Baton ?Weeks in Treatment: 1 ?History of Present Illness ?HPI Description: Admission 10/16/2021 ?[name] is an 81 year old female with a past medical history of rheumatoid arthritis, interstitial lung disease that presents to the clinic for a 1 week ?history of wounds to her left lower extremity following a fall. She visited the ED following the trauma. She had 25 sutures placed to the left lower extremity ?wound. She also reports a hematoma to the left lateral thigh with an abrasion mark. She was started on doxycycline in the ED and she completed this. She ?currently denies signs of infection. ?4/13; patient admitted the clinic last week after a fall. She had a fairly sizable laceration on the left lower leg that was sutured in the ER. Sutures are still in ?place. Also problematic she has a large hematoma over the left lateral hip. Apparently she did not have any x-rays done when she was in the ER of this area ?because it only really showed up 24 to 48 hours later. We have been using gentamicin and Hydrofera Blue to both areas. ?Electronic Signature(s) ?Signed: 10/23/2021 4:28:39 PM By: Linton Ham MD ?Entered By: Linton Ham on 10/23/2021 14:32:20 ?-------------------------------------------------------------------------------- ?Physical Exam Details ?Patient Name: Date of Service: ?Stacey [name], Stacey [name]. 10/23/2021 1:15 PM ?Medical Record Number: [id number] ?Patient Account Number: [id number] ?Date of Birth/Sex: Treating RN: ?08/31/1940 (81 y.o. [name], [name] ?Primary Care Provider:  Shon Baton Other Clinician: ?Referring Provider: ?Treating Provider/Extender: Linton Ham ?Shon Baton ?Weeks in Treatment: 1 ?Constitutional ?Patient is hypertensive.. Pulse regular and within target range for patient.Marland Kitchen Respirations regular, non-labored and within target range.. Temperature is normal and ?within the target range for the patient.Marland Kitchen Appears in no distress. ?Cardiovascular ?Pedal pulses on the left are palpable. Some degree of swelling in the left lower leg what looks to be stasis physiology. Eschar over the lacerated area no ?obvious infection. ?Notes ?Wound exam; left lower extremity. The patient has an arrow shaped wound pointing towards her foot. All of the 3 parts of this are sutured. The concerning area ?is the mid aspect which appears to be dehiscing somewhat. There is some swelling here [mild]. ?On the left lateral hip area a large hematoma. She has some skin that breakdown in the tip of this fortunately not too much. This is nontender no evidence of ?infection ?Electronic Signature(s) ?Signed: 10/23/2021 4:28:39 PM By: Linton Ham MD ?Entered By: Linton Ham on 10/23/2021 14:34:28 ?-------------------------------------------------------------------------------- ?Physician Orders Details ?Patient Name: ?Date of Service: ?Stacey [name], Stacey [name]. 10/23/2021 1:15 PM ?Medical Record Number: [id number] ?Patient Account Number: [id number] ?Date of Birth/Sex: ?Treating RN: ?[birth year] (81 y.o. [name], [name] ?Primary Care Provider: Shon Baton ?Other Clinician: ?Referring Provider: ?Treating Provider/Extender: Linton Ham ?Shon Baton ?Weeks in Treatment: 1 ?Verbal / Phone Orders: No ?Diagnosis Coding ?Follow-up Appointments ?ppointment in 1 week. - Dr. Heber [place] and Allayne Butcher Room # 9 ?Return A ?Bathing/ Shower/ Hygiene ?May shower and wash wound with soap and water. ?Edema Control - Lymphedema / SCD / Other ?Elevate legs to the level of the heart or above for 30 minutes daily and/or when  sitting, a frequency of: - elevate legs when sitting ?Avoid standing for long periods  of time. ?Moisturize legs daily. - keep skin soft with moisturizer, do not get moisturizer on wound bed ?Additional Orders / Instructions ?Follow Nutritious Diet - increase protein intake ?Wound Treatment ?Wound #4 - Lower Leg Wound Laterality: Left, Anterior ?Cleanser: Soap and Water 1 x Per Day/15 Days ?Discharge Instructions: May shower and wash wound with dial antibacterial soap and water prior to dressing change. ?Cleanser: Wound Cleanser (Generic) 1 x Per Day/15 Days ?Discharge Instructions: Cleanse the wound with wound cleanser prior to applying a clean dressing using gauze sponges, not tissue or cotton balls. ?Topical: Gentamicin 1 x Per Day/15 Days ?Discharge Instructions: apply to wound bed at dressing change ?Prim Dressing: Hydrofera Blue Ready Foam, 4x5 in (Generic) 1 x Per Day/15 Days ?ary ?Discharge Instructions: Apply to wound bed as instructed ?Secondary Dressing: Woven Gauze Sponge, Non-Sterile 4x4 in (Generic) 1 x Per Day/15 Days ?Discharge Instructions: Apply over primary dressing as directed. ?Secondary Dressing: Zetuvit Plus 4x8 in 1 x Per Day/15 Days ?Discharge Instructions: Apply over primary dressing as directed. ?Compression Wrap: Kerlix Roll 4.5x3.1 (in/yd) (Generic) 1 x Per Day/15 Days ?Discharge Instructions: Apply Kerlix and Coban compression as directed. ?Compression Wrap: Coban Self-Adherent Wrap 4x5 (in/yd) (Generic) 1 x Per Day/15 Days ?Discharge Instructions: Apply over Kerlix as directed. ?Wound #5 - Upper Leg Wound Laterality: Left, Lateral ?Cleanser: Soap and Water 1 x Per QTM/22 Days ?Discharge Instructions: May shower and wash wound with dial antibacterial soap and water prior to dressing change. ?Cleanser: Wound Cleanser (DME) (Generic) 1 x Per Day/15 Days ?Discharge Instructions: Cleanse the wound with wound cleanser prior to applying a clean dressing using gauze sponges, not tissue or  cotton balls. ?Topical: Gentamicin 1 x Per Day/15 Days ?Discharge Instructions: apply to wound bed at dressing change ?Secondary Dressing: Woven Gauze Sponge, Non-Sterile 4x4 in (DME) (Generic) 1 x Per Day/15 Days ?Discharge Instructions: Apply over primary dressing as directed. ?Secondary Dressing: Zetuvit Plus 4x8 in (DME) (Generic) 1 x Per Day/15 Days ?Discharge Instructions: Apply over primary dressing as directed. ?Secured With: Elastic Bandage 4 inch (ACE bandage) (DME) (Generic) 1 x Per Day/15 Days ?Discharge Instructions: Secure with ACE bandage as directed. ?Electronic Signature(s) ?Signed: 10/23/2021 4:28:39 PM By: Linton Ham MD ?Signed: 10/24/2021 12:54:41 PM By: Rhae Hammock RN ?Entered By: Rhae Hammock on 10/23/2021 14:29:09 ?-------------------------------------------------------------------------------- ?Problem List Details ?Patient Name: ?Date of Service: ?Lyssy, Stacey [name]. 10/23/2021 1:15 PM ?Medical Record Number: [id number] ?Patient Account Number: [id number] ?Date of Birth/Sex: ?Treating RN: ?23-Oct-1940 (81 y.o. 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 10/16/2021 No Yes ?severity ?T34.287G Abrasion, left lower leg, initial encounter 10/16/2021 No Yes ?S70.02XD Contusion of left hip, subsequent encounter 10/23/2021 No Yes ?T79.8XXA Other early complications of trauma, initial encounter 10/16/2021 No Yes ?M06.9 Rheumatoid arthritis, unspecified 10/16/2021 No Yes ?Inactive Problems ?Resolved Problems ?Electronic Signature(s) ?Signed: 10/23/2021 4:28:39 PM By: Linton Ham MD ?Entered By: Linton Ham on 10/23/2021  14:30:56 ?-------------------------------------------------------------------------------- ?Progress Note Details ?Patient Name: ?Date of Service: ?Dorado, Stacey [name]. 10/23/2021 1:15 PM ?Medical Record Number: [id number] ?Patient Account Number: [id number] ?Date of Birth/Sex: ?Treating RN

## 2021-10-28 DIAGNOSIS — S71102D Unspecified open wound, left thigh, subsequent encounter: Secondary | ICD-10-CM | POA: Diagnosis not present

## 2021-10-28 DIAGNOSIS — R296 Repeated falls: Secondary | ICD-10-CM | POA: Diagnosis not present

## 2021-10-28 DIAGNOSIS — M80012D Age-related osteoporosis with current pathological fracture, left shoulder, subsequent encounter for fracture with routine healing: Secondary | ICD-10-CM | POA: Diagnosis not present

## 2021-10-28 DIAGNOSIS — J849 Interstitial pulmonary disease, unspecified: Secondary | ICD-10-CM | POA: Diagnosis not present

## 2021-10-28 DIAGNOSIS — S81812D Laceration without foreign body, left lower leg, subsequent encounter: Secondary | ICD-10-CM | POA: Diagnosis not present

## 2021-10-28 DIAGNOSIS — M0510 Rheumatoid lung disease with rheumatoid arthritis of unspecified site: Secondary | ICD-10-CM | POA: Diagnosis not present

## 2021-10-29 DIAGNOSIS — J849 Interstitial pulmonary disease, unspecified: Secondary | ICD-10-CM | POA: Diagnosis not present

## 2021-10-29 DIAGNOSIS — S7002XA Contusion of left hip, initial encounter: Secondary | ICD-10-CM | POA: Diagnosis not present

## 2021-10-29 DIAGNOSIS — S81812D Laceration without foreign body, left lower leg, subsequent encounter: Secondary | ICD-10-CM | POA: Diagnosis not present

## 2021-10-29 DIAGNOSIS — M0510 Rheumatoid lung disease with rheumatoid arthritis of unspecified site: Secondary | ICD-10-CM | POA: Diagnosis not present

## 2021-10-29 DIAGNOSIS — M80012D Age-related osteoporosis with current pathological fracture, left shoulder, subsequent encounter for fracture with routine healing: Secondary | ICD-10-CM | POA: Diagnosis not present

## 2021-10-29 DIAGNOSIS — S42002D Fracture of unspecified part of left clavicle, subsequent encounter for fracture with routine healing: Secondary | ICD-10-CM | POA: Diagnosis not present

## 2021-10-29 DIAGNOSIS — R296 Repeated falls: Secondary | ICD-10-CM | POA: Diagnosis not present

## 2021-10-29 DIAGNOSIS — S71102D Unspecified open wound, left thigh, subsequent encounter: Secondary | ICD-10-CM | POA: Diagnosis not present

## 2021-10-30 ENCOUNTER — Encounter (HOSPITAL_BASED_OUTPATIENT_CLINIC_OR_DEPARTMENT_OTHER): Payer: Medicare Other | Admitting: Internal Medicine

## 2021-10-30 DIAGNOSIS — T798XXA Other early complications of trauma, initial encounter: Secondary | ICD-10-CM | POA: Diagnosis not present

## 2021-10-30 DIAGNOSIS — L97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 with unspecified severity: Secondary | ICD-10-CM | POA: Diagnosis not present

## 2021-10-30 DIAGNOSIS — S7002XD Contusion of left hip, subsequent encounter: Secondary | ICD-10-CM | POA: Diagnosis not present

## 2021-10-30 DIAGNOSIS — S80812A Abrasion, left lower leg, initial encounter: Secondary | ICD-10-CM | POA: Diagnosis not present

## 2021-10-30 DIAGNOSIS — M069 Rheumatoid arthritis, unspecified: Secondary | ICD-10-CM | POA: Diagnosis not present

## 2021-10-31 NOTE — Progress Notes (Signed)
CARLETA, WOODROW. ([id number]) ?Visit Report for 10/30/2021 ?Chief Complaint Document Details ?Patient Name: Date of Service: ?Rotz, [name] 10/30/2021 2:45 PM ?Medical Record Number: [id number] ?Patient Account Number: [id number] ?Date of Birth/Sex: Treating RN: ?[birth year] (81 y.o. [name], [name] ?Primary Care Provider: Shon Baton Other Clinician: ?Referring Provider: ?Treating Provider/Extender: Kalman Shan ?Shon Baton ?Weeks in Treatment: 2 ?Information Obtained from: Patient ?Chief Complaint ?10/16/2021; left lower extremity wounds following trauma ?Electronic Signature(s) ?Signed: 10/31/2021 4:54:21 PM By: Kalman Shan DO ?Entered By: Kalman Shan on 10/30/2021 16:41:50 ?-------------------------------------------------------------------------------- ?Debridement Details ?Patient Name: Date of Service: ?Gram, [name] 10/30/2021 2:45 PM ?Medical Record Number: [id number] ?Patient Account Number: [id number] ?Date of Birth/Sex: Treating RN: ?12/02/1940 (81 y.o. [name], [name] ?Primary Care Provider: Shon Baton Other Clinician: ?Referring Provider: ?Treating Provider/Extender: Kalman Shan ?Shon Baton ?Weeks in Treatment: 2 ?Debridement Performed for Assessment: Wound #4 Left,Anterior Lower Leg ?Performed By: Physician Kalman Shan, DO ?Debridement Type: Debridement ?Level of Consciousness (Pre-procedure): Awake and Alert ?Pre-procedure Verification/Time Out Yes - 16:18 ?Taken: ?Start Time: 16:18 ?Pain Control: Lidocaine ?T Area Debrided (L x W): ?otal 8.5 (cm) x 3 (cm) = 25.5 (cm?) ?Tissue and other material debrided: ?Viable, Non-Viable, Slough, Subcutaneous, Skin: Dermis , Skin: Epidermis, Slough ?Level: Skin/Subcutaneous Tissue ?Debridement Description: Excisional ?Instrument: Curette ?Bleeding: Minimum ?Hemostasis Achieved: Pressure ?End Time: 16:18 ?Procedural Pain: 0 ?Post Procedural Pain: 0 ?Response to Treatment: Procedure was tolerated well ?Level of  Consciousness (Post- Awake and Alert ?procedure): ?Post Debridement Measurements of Total Wound ?Length: (cm) 8.5 ?Width: (cm) 3 ?Depth: (cm) 0.2 ?Volume: (cm?) 4.006 ?Character of Wound/Ulcer Post Debridement: Improved ?Post Procedure Diagnosis ?Same as Pre-procedure ?Electronic Signature(s) ?Signed: 10/30/2021 5:27:50 PM By: Rhae Hammock RN ?Signed: 10/31/2021 4:54:21 PM By: Kalman Shan DO ?Entered By: Rhae Hammock on 10/30/2021 16:19:24 ?-------------------------------------------------------------------------------- ?HPI Details ?Patient Name: Date of Service: ?Decoster, [name] 10/30/2021 2:45 PM ?Medical Record Number: [id number] ?Patient Account Number: [id number] ?Date of Birth/Sex: Treating RN: ?02/07/1941 (81 y.o. [name], [name] ?Primary Care Provider: Shon Baton Other Clinician: ?Referring Provider: ?Treating Provider/Extender: Kalman Shan ?Shon Baton ?Weeks in Treatment: 2 ?History of Present Illness ?HPI Description: Admission 10/16/2021 ?Ms. Makyia Erxleben is an 81 year old female with a past medical history of rheumatoid arthritis, interstitial lung disease that presents to the clinic for a 1 week ?history of wounds to her left lower extremity following a fall. She visited the ED following the trauma. She had 25 sutures placed to the left lower extremity ?wound. She also reports a hematoma to the left lateral thigh with an abrasion mark. She was started on doxycycline in the ED and she completed this. She ?currently denies signs of infection. ?4/13; patient admitted the clinic last week after a fall. She had a fairly sizable laceration on the left lower leg that was sutured in the ER. Sutures are still in ?place. Also problematic she has a large hematoma over the left lateral hip. Apparently she did not have any x-rays done when she was in the ER of this area ?because it only really showed up 24 to 48 hours later. We have been using gentamicin and Hydrofera Blue to both  areas. ?4/20; patient presents for follow-up. She states that her orthopedic surgeon drained the hematoma to her left lateral thigh. She has no open wounds to this area. ?She states she tolerated the compression wrap well up to the left lower extremity. She has no issues or complaints today. She denies signs of infection. ?Electronic Signature(s) ?Signed: 10/31/2021  4:54:21 PM By: Kalman Shan DO ?Entered By: Kalman Shan on 10/30/2021 16:42:25 ?-------------------------------------------------------------------------------- ?Physical Exam Details ?Patient Name: Date of Service: ?Forney, [name] 10/30/2021 2:45 PM ?Medical Record Number: [id number] ?Patient Account Number: [id number] ?Date of Birth/Sex: Treating RN: ?12/17/1940 (81 y.o. [name], [name] ?Primary Care Provider: Shon Baton Other Clinician: ?Referring Provider: ?Treating Provider/Extender: Kalman Shan ?Shon Baton ?Weeks in Treatment: 2 ?Constitutional ?respirations regular, non-labored and within target range for patient.Marland Kitchen ?Cardiovascular ?2+ dorsalis pedis/posterior tibialis pulses. ?Psychiatric ?pleasant and cooperative. ?Notes ?Left lower extremity: T the anterior aspect there is a wound with sutures in place although most of the wound appears dehisced. No signs of surrounding ?o ?infection. ?T the left lateral hip there are no open wounds. No hematoma noted. ?o ?Electronic Signature(s) ?Signed: 10/31/2021 4:54:21 PM By: Kalman Shan DO ?Entered By: Kalman Shan on 10/30/2021 16:43:52 ?-------------------------------------------------------------------------------- ?Physician Orders Details ?Patient Name: ?Date of Service: ?Anding, [name] 10/30/2021 2:45 PM ?Medical Record Number: [id number] ?Patient Account Number: [id number] ?Date of Birth/Sex: ?Treating RN: ?11/09/1940 (81 y.o. [name], [name] ?Primary Care Provider: Shon Baton ?Other Clinician: ?Referring Provider: ?Treating Provider/Extender: Kalman Shan ?Shon Baton ?Weeks in Treatment: 2 ?Verbal / Phone Orders: No ?Diagnosis Coding ?Follow-up Appointments ?ppointment in 1 week. - Dr. Dellia Nims and Allayne Butcher Room # 9 ?Return A ?Bathing/ Shower/ Hygiene ?May shower and wash wound with soap and water. ?Edema Control - Lymphedema / SCD / Other ?Elevate legs to the level of the heart or above for 30 minutes daily and/or when sitting, a frequency of: - elevate legs when sitting ?Avoid standing for long periods of time. ?Moisturize legs daily. - keep skin soft with moisturizer, do not get moisturizer on wound bed ?Additional Orders / Instructions ?Follow Nutritious Diet - increase protein intake ?Wound Treatment ?Wound #4 - Lower Leg Wound Laterality: Left, Anterior ?Cleanser: Soap and Water 1 x Per Day/15 Days ?Discharge Instructions: May shower and wash wound with dial antibacterial soap and water prior to dressing change. ?Cleanser: Wound Cleanser (Generic) 1 x Per Day/15 Days ?Discharge Instructions: Cleanse the wound with wound cleanser prior to applying a clean dressing using gauze sponges, not tissue or cotton balls. ?Topical: Gentamicin 1 x Per Day/15 Days ?Discharge Instructions: apply to wound bed at dressing change ?Prim Dressing: Hydrofera Blue Ready Foam, 4x5 in (Generic) 1 x Per Day/15 Days ?ary ?Discharge Instructions: Apply to wound bed as instructed ?Secondary Dressing: Woven Gauze Sponge, Non-Sterile 4x4 in (Generic) 1 x Per Day/15 Days ?Discharge Instructions: Apply over primary dressing as directed. ?Secondary Dressing: Zetuvit Plus 4x8 in 1 x Per Day/15 Days ?Discharge Instructions: Apply over primary dressing as directed. ?Compression Wrap: Kerlix Roll 4.5x3.1 (in/yd) (Generic) 1 x Per Day/15 Days ?Discharge Instructions: Apply Kerlix and Coban compression as directed. ?Compression Wrap: Coban Self-Adherent Wrap 4x5 (in/yd) (Generic) 1 x Per Day/15 Days ?Discharge Instructions: Apply over Kerlix as directed. ?Electronic Signature(s) ?Signed:  10/31/2021 4:54:21 PM By: Kalman Shan DO ?Entered By: Kalman Shan on 10/30/2021 16:44:13 ?-------------------------------------------------------------------------------- ?Problem List Details ?Patient Name: ?Date of 43

## 2021-10-31 NOTE — Progress Notes (Signed)
DAJSHA, MASSARO. ([id number]) ?Visit Report for 10/30/2021 ?Arrival Information Details ?Patient Name: Date of Service: ?Stacey [name], Stacey [name]. 10/30/2021 2:45 PM ?Medical Record Number: [id number] ?Patient Account Number: [id number] ?Date of Birth/Sex: Treating RN: ?Jul 10, 1941 (81 y.o. [name], [name] ?Primary Care Pricilla Moehle: Shon Baton Other Clinician: ?Referring Hamlet Lasecki: ?Treating Trejan Buda/Extender: Kalman Shan ?Shon Baton ?Weeks in Treatment: 2 ?Visit Information History Since Last Visit ?Added or deleted any medications: No ?Patient Arrived: Gilford Rile ?Any new allergies or adverse reactions: No ?Arrival Time: 16:10 ?Had a fall or experienced change in No ?Accompanied By: self ?activities of daily living that may affect ?Transfer Assistance: Manual ?risk of falls: ?Patient Identification Verified: Yes ?Signs or symptoms of abuse/neglect since last visito No ?Secondary Verification Process Completed: Yes ?Hospitalized since last visit: No ?Patient Requires Transmission-Based Precautions: No ?Implantable device outside of the clinic excluding No ?cellular tissue based products placed in the center ?since last visit: ?Has Dressing in Place as Prescribed: Yes ?Has Compression in Place as Prescribed: Yes ?Pain Present Now: No ?Electronic Signature(s) ?Signed: 10/30/2021 5:27:50 PM By: Rhae Hammock RN ?Entered By: Rhae Hammock on 10/30/2021 16:12:06 ?-------------------------------------------------------------------------------- ?Encounter Discharge Information Details ?Patient Name: Date of Service: ?Stacey [name], Stacey [name]. 10/30/2021 2:45 PM ?Medical Record Number: [id number] ?Patient Account Number: [id number] ?Date of Birth/Sex: Treating RN: ?11-02-1940 (81 y.o. [name], [name] ?Primary Care Armstead Heiland: Shon Baton Other Clinician: ?Referring Shayma Pfefferle: ?Treating Jahmad Petrich/Extender: Kalman Shan ?Shon Baton ?Weeks in Treatment: 2 ?Encounter Discharge Information Items Post Procedure  Vitals ?Discharge Condition: Stable ?Temperature (F): 98.7 ?Ambulatory Status: Gilford Rile ?Pulse (bpm): 74 ?Discharge Destination: Home ?Respiratory Rate (breaths/min): 17 ?Transportation: Private Auto ?Blood Pressure (mmHg): 134/74 ?Accompanied By: self ?Schedule Follow-up Appointment: Yes ?Clinical Summary of Care: Patient Declined ?Electronic Signature(s) ?Signed: 10/30/2021 5:27:50 PM By: Rhae Hammock RN ?Entered By: Rhae Hammock on 10/30/2021 16:57:09 ?-------------------------------------------------------------------------------- ?Lower Extremity Assessment Details ?Patient Name: ?Date of Service: ?Stacey [name], Stacey [name]. 10/30/2021 2:45 PM ?Medical Record Number: [id number] ?Patient Account Number: [id number] ?Date of Birth/Sex: ?Treating RN: ?1941-04-04 (81 y.o. [name], [name] ?Primary Care [place] Current: Shon Baton ?Other Clinician: ?Referring Jaziah Goeller: ?Treating Jashae Wiggs/Extender: Kalman Shan ?Shon Baton ?Weeks in Treatment: 2 ?Edema Assessment ?Assessed: [Left: Yes] [Right: No] ?Edema: [Left: Ye] [Right: s] ?Calf ?Left: Right: ?Point of Measurement: 32 cm From Medial Instep 32 cm ?Ankle ?Left: Right: ?Point of Measurement: 11 cm From Medial Instep 19 cm ?Vascular Assessment ?Pulses: ?Dorsalis Pedis ?Palpable: [Left:Yes] ?Electronic Signature(s) ?Signed: 10/30/2021 5:27:50 PM By: Rhae Hammock RN ?Entered By: Rhae Hammock on 10/30/2021 15:55:04 ?-------------------------------------------------------------------------------- ?Multi Wound Chart Details ?Patient Name: ?Date of Service: ?Stacey [name], Stacey [name]. 10/30/2021 2:45 PM ?Medical Record Number: [id number] ?Patient Account Number: [id number] ?Date of Birth/Sex: ?Treating RN: ?[birth year] (81 y.o. [name], [name] ?Primary Care Nyquan Selbe: Shon Baton ?Other Clinician: ?Referring Taimi Towe: ?Treating Domnique Vanegas/Extender: Kalman Shan ?Shon Baton ?Weeks in Treatment: 2 ?Vital Signs ?Height(in): 65 ?Pulse(bpm): 74 ?Weight(lbs): 133 ?Blood  Pressure(mmHg): 134/74 ?Body Mass Index(BMI): 22.1 ?Temperature(??F): 98.7 ?Respiratory Rate(breaths/min): 17 ?Photos: [N/A:N/A] ?Left, Anterior Lower Leg Left, Lateral Upper Leg N/A ?Wound Location: ?Trauma Trauma N/A ?Wounding Event: ?Trauma, Other Trauma, Other N/A ?Primary Etiology: ?Rheumatoid Arthritis Rheumatoid Arthritis N/A ?Comorbid History: ?10/08/2021 10/08/2021 N/A ?Date Acquired: ?2 2 N/A ?Weeks of Treatment: ?Open Open N/A ?Wound Status: ?No No N/A ?Wound Recurrence: ?8.5x3x0.2 0x0x0 N/A ?Measurements L x W x D (cm) ?20.028 0 N/A ?A (cm?) : ?rea ?4.006 0 N/A ?Volume (cm?) : ?50.70% 100.00% N/A ?% Reduction in A rea: ?50.70% 100.00% N/A ?% Reduction in Volume: ?Full  Thickness Without Exposed Full Thickness Without Exposed N/A ?Classification: ?Support Structures Support Structures ?Medium Medium N/A ?Exudate A mount: ?Serosanguineous Serosanguineous N/A ?Exudate Type: ?red, brown red, brown N/A ?Exudate Color: ?Flat and Intact Flat and Intact N/A ?Wound Margin: ?Small (1-33%) Large (67-100%) N/A ?Granulation A mount: ?Red Red N/A ?Granulation Quality: ?Large (67-100%) N/A N/A ?Necrotic A mount: ?Eschar N/A N/A ?Necrotic Tissue: ?Fat Layer (Subcutaneous Tissue): Yes Fat Layer (Subcutaneous Tissue): Yes N/A ?Exposed Structures: ?Fascia: No ?Tendon: No ?Muscle: No ?Joint: No ?Bone: No ?None Small (1-33%) N/A ?Epithelialization: ?Debridement - Excisional N/A N/A ?Debridement: ?Pre-procedure Verification/Time Out 16:18 N/A N/A ?Taken: ?Lidocaine N/A N/A ?Pain Control: ?Subcutaneous, Slough N/A N/A ?Tissue Debrided: ?Skin/Subcutaneous Tissue N/A N/A ?Level: ?25.5 N/A N/A ?Debridement A (sq cm): ?rea ?Curette N/A N/A ?Instrument: ?Minimum N/A N/A ?Bleeding: ?Pressure N/A N/A ?Hemostasis A chieved: ?0 N/A N/A ?Procedural Pain: ?0 N/A N/A ?Post Procedural Pain: ?Procedure was tolerated well N/A N/A ?Debridement Treatment Response: ?8.5x3x0.2 N/A N/A ?Post Debridement Measurements L x ?W x D (cm) ?4.006 N/A  N/A ?Post Debridement Volume: (cm?) ?Debridement N/A N/A ?Procedures Performed: ?Treatment Notes ?Electronic Signature(s) ?Signed: 10/30/2021 5:27:50 PM By: Rhae Hammock RN ?Signed: 10/31/2021 4:54:21 PM By: Kalman Shan DO ?Entered By: Kalman Shan on 10/30/2021 16:41:39 ?-------------------------------------------------------------------------------- ?Multi-Disciplinary Care Plan Details ?Patient Name: ?Date of Service: ?Stacey [name], Stacey [name]. 10/30/2021 2:45 PM ?Medical Record Number: [id number] ?Patient Account Number: [id number] ?Date of Birth/Sex: ?Treating RN: ?[birth year] (81 y.o. [name], [name] ?Primary Care Buelah Rennie: Shon Baton ?Other Clinician: ?Referring Cyniah Gossard: ?Treating Juley Giovanetti/Extender: Kalman Shan ?Shon Baton ?Weeks in Treatment: 2 ?Active Inactive ?Abuse / Safety / Falls / Self Care Management ?Nursing Diagnoses: ?History of Falls ?Potential for injury related to falls ?Goals: ?Patient will remain injury free related to falls ?Date Initiated: 10/16/2021 ?Target Resolution Date: 11/13/2021 ?Goal Status: Active ?Interventions: ?Provide education on fall prevention ?Notes: ?Nutrition ?Nursing Diagnoses: ?Potential for alteratiion in Nutrition/Potential for imbalanced nutrition ?Goals: ?Patient/caregiver agrees to and verbalizes understanding of need to use nutritional supplements and/or vitamins as prescribed ?Date Initiated: 10/16/2021 ?Target Resolution Date: 11/13/2021 ?Goal Status: Active ?Interventions: ?Provide education on nutrition ?Treatment Activities: ?Education provided on Nutrition : 10/23/2021 ?Notes: ?Wound/Skin Impairment ?Nursing Diagnoses: ?Impaired tissue integrity ?Goals: ?Ulcer/skin breakdown will have a volume reduction of 30% by week 4 ?Date Initiated: 10/16/2021 ?Target Resolution Date: 11/13/2021 ?Goal Status: Active ?Interventions: ?Assess patient/caregiver ability to obtain necessary supplies ?Provide education on ulcer and skin care ?Notes: ?Electronic  Signature(s) ?Signed: 10/30/2021 5:27:50 PM By: Rhae Hammock RN ?Entered By: Rhae Hammock on 10/30/2021 16:12:49 ?-------------------------------------------------------------------------------- ?Pain Assessment Details ?Patient

## 2021-11-03 NOTE — Progress Notes (Signed)
BABBIE, DONDLINGER. ([id number]) ?Visit Report for 10/16/2021 ?Allergy List Details ?Patient Name: Date of Service: ?[place], [name] 10/16/2021 8:15 A M ?Medical Record Number: [id number] ?Patient Account Number: [id number] ?Date of Birth/Sex: Treating RN: ?[birth year] (81 y.o. F) Sharyn Creamer ?Primary Care Provider: Shon Baton Other Clinician: ?Referring Provider: ?Treating Provider/Extender: Kalman Shan ?Shon Baton ?Weeks in Treatment: 0 ?Allergies ?Active Allergies ?penicillin ?Reaction: allergy ?Remicade ?Reaction: unknown ?Sulfa (Sulfonamide Antibiotics) ?Reaction: allergy ?Allergy Notes ?unknown reaction- from childhood ?Electronic Signature(s) ?Signed: 10/16/2021 4:31:51 PM By: Sharyn Creamer RN, BSN ?Entered By: Sharyn Creamer on 10/16/2021 08:52:27 ?-------------------------------------------------------------------------------- ?Arrival Information Details ?Patient Name: Date of Service: ?[place], [name] 10/16/2021 8:15 A M ?Medical Record Number: [id number] ?Patient Account Number: [id number] ?Date of Birth/Sex: Treating RN: ?September 04, 1940 (81 y.o. F) Sharyn Creamer ?Primary Care Provider: Shon Baton Other Clinician: ?Referring Provider: ?Treating Provider/Extender: Kalman Shan ?Shon Baton ?Weeks in Treatment: 0 ?Visit Information ?Patient Arrived: Gilford Rile ?Arrival Time: 08:43 ?Accompanied By: [employer] ?Transfer Assistance: None ?Patient Identification Verified: Yes ?Secondary Verification Process Completed: Yes ?Patient Requires Transmission-Based Precautions: No ?History Since Last Visit ?Added or deleted any medications: No ?Any new allergies or adverse reactions: No ?Had a fall or experienced change in activities of daily living that may affect risk of falls: Yes ?Signs or symptoms of abuse/neglect since last visito No ?Implantable device outside of the clinic excluding cellular tissue based products placed in the center since last visit: No ?Has Dressing in Place as Prescribed: Yes ?Pain  Present Now: Yes ?Electronic Signature(s) ?Signed: 10/16/2021 4:31:51 PM By: Sharyn Creamer RN, BSN ?Entered By: Sharyn Creamer on 10/16/2021 08:46:31 ?-------------------------------------------------------------------------------- ?Clinic Level of Care Assessment Details ?Patient Name: Date of Service: ?[place], [name] 10/16/2021 8:15 A M ?Medical Record Number: [id number] ?Patient Account Number: [id number] ?Date of Birth/Sex: Treating RN: ?01-29-1941 (81 y.o. F) Sharyn Creamer ?Primary Care Provider: Shon Baton Other Clinician: ?Referring Provider: ?Treating Provider/Extender: Kalman Shan ?Shon Baton ?Weeks in Treatment: 0 ?Clinic Level of Care Assessment Items ?TOOL 1 Quantity Score ?X- 1 0 ?Use when EandM and Procedure is performed on INITIAL visit ?ASSESSMENTS - Nursing Assessment / Reassessment ?X- 1 20 ?General Physical Exam (combine w/ comprehensive assessment (listed just below) when performed on new pt. evals) ?X- 1 25 ?Comprehensive Assessment (HX, ROS, Risk Assessments, Wounds Hx, etc.) ?ASSESSMENTS - Wound and Skin Assessment / Reassessment ?X- 1 10 ?Dermatologic / Skin Assessment (not related to wound area) ?ASSESSMENTS - Ostomy and/or Continence Assessment and Care ?[] - 0 ?Incontinence Assessment and Management ?[] - 0 ?Ostomy Care Assessment and Management (repouching, etc.) ?PROCESS - Coordination of Care ?[] - 0 ?Simple Patient / Family Education for ongoing care ?X- 1 20 ?Complex (extensive) Patient / Family Education for ongoing care ?X- 1 10 ?Staff obtains Consents, Records, T Results / Process Orders ?est ?[] - 0 ?Staff telephones [place], Nursing Homes / Clarify orders / etc ?[] - 0 ?Routine Transfer to another Facility (non-emergent condition) ?[] - 0 ?Routine Hospital Admission (non-emergent condition) ?X- 1 15 ?New Admissions / Biomedical engineer / Ordering NPWT Apligraf, etc. ?, ?[] - 0 ?Emergency Hospital Admission (emergent condition) ?PROCESS - Special Needs ?[] -  0 ?Pediatric / Minor Patient Management ?[] - 0 ?Isolation Patient Management ?[] - 0 ?Hearing / Language / Visual special needs ?[] - 0 ?Assessment of Community assistance (transportation, D/C planning, etc.) ?[] - 0 ?Additional assistance / Altered mentation ?[] - 0 ?Support Surface(s) Assessment (bed, cushion, seat, etc.) ?INTERVENTIONS - Miscellaneous ?[] - 0 ?  External ear exam ?[] - 0 ?Patient Transfer (multiple staff / Civil Service fast streamer / Similar devices) ?[] - 0 ?Simple Staple / Suture removal (25 or less) ?X- 1 10 ?Complex Staple / Suture removal (26 or more) ?[] - 0 ?Hypo/Hyperglycemic Management (do not check if billed separately) ?X- 1 15 ?Ankle / Brachial Index (ABI) - do not check if billed separately ?Has the patient been seen at the hospital within the last three years: Yes ?Total Score: 125 ?Level Of Care: New/Established - Level 4 ?Electronic Signature(s) ?Signed: 10/16/2021 4:31:51 PM By: Sharyn Creamer RN, BSN ?Entered By: Sharyn Creamer on 10/16/2021 16:06:13 ?-------------------------------------------------------------------------------- ?Encounter Discharge Information Details ?Patient Name: ?Date of Service: ?[place], [name] 10/16/2021 8:15 A M ?Medical Record Number: [id number] ?Patient Account Number: [id number] ?Date of Birth/Sex: ?Treating RN: ?[birth year] (81 y.o. F) Sharyn Creamer ?Primary Care Provider: Shon Baton ?Other Clinician: ?Referring Provider: ?Treating Provider/Extender: Kalman Shan ?Shon Baton ?Weeks in Treatment: 0 ?Encounter Discharge Information Items ?Discharge Condition: Stable ?Ambulatory Status: Gilford Rile ?Discharge Destination: Home ?Transportation: Private Auto ?Accompanied By: Olam Idler ?Schedule Follow-up Appointment: Yes ?Clinical Summary of Care: Patient Declined ?Electronic Signature(s) ?Signed: 10/16/2021 4:31:51 PM By: Sharyn Creamer RN, BSN ?Entered By: Sharyn Creamer on 10/16/2021  10:06:51 ?-------------------------------------------------------------------------------- ?Lower Extremity Assessment Details ?Patient Name: ?Date of Service: ?[place], [name] 10/16/2021 8:15 A M ?Medical Record Number: [id number] ?Patient Account Number: [id number] ?Date of Birth/Sex: ?Treating RN: ?[birth year] (81 y.o. F) Sharyn Creamer ?Primary Care Provider: Shon Baton ?Other Clinician: ?Referring Provider: ?Treating Provider/Extender: Kalman Shan ?Shon Baton ?Weeks in Treatment: 0 ?Edema Assessment ?Assessed: [Left: Yes] [Right: No] ?Edema: [Left: Ye] [Right: s] ?Calf ?Left: Right: ?Point of Measurement: 32 cm From Medial Instep 31 cm ?Ankle ?Left: Right: ?Point of Measurement: 11 cm From Medial Instep 18.6 cm ?Knee To Floor ?Left: Right: ?From Medial Instep 41 cm ?Vascular Assessment ?Pulses: ?Dorsalis Pedis ?Palpable: [Left:Yes] ?Blood Pressure: ?Brachial: [Left:154] ?Ankle: ?[Left:Dorsalis Pedis: 146 0.95] ?Electronic Signature(s) ?Signed: 10/16/2021 4:31:51 PM By: Sharyn Creamer RN, BSN ?Entered By: Sharyn Creamer on 10/16/2021 09:48:56 ?-------------------------------------------------------------------------------- ?Multi Wound Chart Details ?Patient Name: ?Date of Service: ?[place], [name] 10/16/2021 8:15 A M ?Medical Record Number: [id number] ?Patient Account Number: [id number] ?Date of Birth/Sex: ?Treating RN: ?05/10/1941 (81 y.o. [name], [name] ?Primary Care Provider: Shon Baton ?Other Clinician: ?Referring Provider: ?Treating Provider/Extender: Kalman Shan ?Shon Baton ?Weeks in Treatment: 0 ?Vital Signs ?Height(in): 65 ?Pulse(bpm): 90 ?Weight(lbs): 133 ?Blood Pressure(mmHg): 158/74 ?Body Mass Index(BMI): 22.1 ?Temperature(??F): 98.1 ?Respiratory Rate(breaths/min): 18 ?Photos: [N/A:N/A] ?Left, Anterior Lower Leg Left, Lateral Upper Leg N/A ?Wound Location: ?Trauma Trauma N/A ?Wounding Event: ?Trauma, Other Trauma, Other N/A ?Primary Etiology: ?Rheumatoid Arthritis Rheumatoid  Arthritis N/A ?Comorbid History: ?10/08/2021 10/08/2021 N/A ?Date Acquired: ?0 0 N/A ?Weeks of Treatment: ?Open Open N/A ?Wound Status: ?No No N/A ?Wound Recurrence: ?11x4.7x0.2 1.5x1.3x0.1 N/A ?Measurements L x W x D (cm) ?40.605 1.532 N/A ?A (cm?) : ?rea ?8.121 0.153 N/A ?Volume (cm?) : ?0.00% 0.00% N/A ?%

## 2021-11-07 ENCOUNTER — Encounter (HOSPITAL_BASED_OUTPATIENT_CLINIC_OR_DEPARTMENT_OTHER): Payer: Medicare Other | Admitting: Internal Medicine

## 2021-11-07 DIAGNOSIS — L97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 with unspecified severity: Secondary | ICD-10-CM | POA: Diagnosis not present

## 2021-11-07 DIAGNOSIS — S80812A Abrasion, left lower leg, initial encounter: Secondary | ICD-10-CM | POA: Diagnosis not present

## 2021-11-07 DIAGNOSIS — T798XXA Other early complications of trauma, initial encounter: Secondary | ICD-10-CM | POA: Diagnosis not present

## 2021-11-07 DIAGNOSIS — S7002XD Contusion of left hip, subsequent encounter: Secondary | ICD-10-CM | POA: Diagnosis not present

## 2021-11-07 DIAGNOSIS — M069 Rheumatoid arthritis, unspecified: Secondary | ICD-10-CM | POA: Diagnosis not present

## 2021-11-07 DIAGNOSIS — L97822 Non-pressure chronic ulcer of other part of left lower leg with fat layer exposed: Secondary | ICD-10-CM | POA: Diagnosis not present

## 2021-11-07 NOTE — Progress Notes (Signed)
ELIYAH, MCSHEA. ([id number]) ?Visit Report for 11/07/2021 ?Debridement Details ?Patient Name: Date of Service: ?[place], [name] 11/07/2021 2:15 PM ?Medical Record Number: [id number] ?Patient Account Number: [id number] ?Date of Birth/Sex: Treating RN: ?[birth year] (81 y.o. F) Deaton, Bobbi ?Primary Care Provider: Shon Baton Other Clinician: ?Referring Provider: ?Treating Provider/Extender: Linton Ham ?Shon Baton ?Weeks in Treatment: 3 ?Debridement Performed for Assessment: Wound #4 Left,Anterior Lower Leg ?Performed By: Physician Ricard Dillon., MD ?Debridement Type: Debridement ?Level of Consciousness (Pre-procedure): Awake and Alert ?Pre-procedure Verification/Time Out Yes - 15:15 ?Taken: ?Start Time: 15:15 ?Pain Control: Lidocaine ?T Area Debrided (L x W): ?otal 7.5 (cm) x 2 (cm) = 15 (cm?) ?Tissue and other material debrided: ?Viable, Non-Viable, Slough, Subcutaneous, Skin: Dermis , Skin: Epidermis, Slough ?Level: Skin/Subcutaneous Tissue ?Debridement Description: Excisional ?Instrument: Curette ?Bleeding: Minimum ?Hemostasis Achieved: Pressure ?End Time: 15:15 ?Procedural Pain: 0 ?Post Procedural Pain: 0 ?Response to Treatment: Procedure was tolerated well ?Level of Consciousness (Post- Awake and Alert ?procedure): ?Post Debridement Measurements of Total Wound ?Length: (cm) 7.5 ?Width: (cm) 2 ?Depth: (cm) 0.2 ?Volume: (cm?) 2.356 ?Character of Wound/Ulcer Post Debridement: Improved ?Post Procedure Diagnosis ?Same as Pre-procedure ?Electronic Signature(s) ?Signed: 11/07/2021 4:01:11 PM By: Linton Ham MD ?Signed: 11/07/2021 4:52:29 PM By: Deon Pilling RN, BSN ?Entered By: Linton Ham on 11/07/2021 15:46:29 ?-------------------------------------------------------------------------------- ?HPI Details ?Patient Name: Date of Service: ?[place], [name] 11/07/2021 2:15 PM ?Medical Record Number: [id number] ?Patient Account Number: [id number] ?Date of Birth/Sex: Treating RN: ?[birth year] (81 y.o.  F) Deaton, Bobbi ?Primary Care Provider: Shon Baton Other Clinician: ?Referring Provider: ?Treating Provider/Extender: Linton Ham ?Shon Baton ?Weeks in Treatment: 3 ?History of Present Illness ?HPI Description: Admission 10/16/2021 ?Ms. Mckynlie Vanderslice is an 81 year old female with a past medical history of rheumatoid arthritis, interstitial lung disease that presents to the clinic for a 1 week ?history of wounds to her left lower extremity following a fall. She visited the ED following the trauma. She had 25 sutures placed to the left lower extremity ?wound. She also reports a hematoma to the left lateral thigh with an abrasion mark. She was started on doxycycline in the ED and she completed this. She ?currently denies signs of infection. ?4/13; patient admitted the clinic last week after a fall. She had a fairly sizable laceration on the left lower leg that was sutured in the ER. Sutures are still in ?place. Also problematic she has a large hematoma over the left lateral hip. Apparently she did not have any x-rays done when she was in the ER of this area ?because it only really showed up 24 to 48 hours later. We have been using gentamicin and Hydrofera Blue to both areas. ?4/20; patient presents for follow-up. She states that her orthopedic surgeon drained the hematoma to her left lateral thigh. She has no open wounds to this area. ?She states she tolerated the compression wrap well up to the left lower extremity. She has no issues or complaints today. She denies signs of infection. ?4/28; laceration on the left lower extremity following a fall. Since I last saw this the sutures have been removed but the wound is dehisced and the surface is ?necrotic. We have been using gentamicin and Hydrofera Blue ?She tells me that the hematoma was seen by orthopedic surgery and they drained this using a large bore needle. We did not look at this today. Finally she is ?going to [facility] for relatives graduation next Friday  we will bring her in early for a nurse visit to change  the dressing she will be back in time to be seen the ?week after next ?Electronic Signature(s) ?Signed: 11/07/2021 4:01:11 PM By: Linton Ham MD ?Entered By: Linton Ham on 11/07/2021 15:47:50 ?-------------------------------------------------------------------------------- ?Physical Exam Details ?Patient Name: Date of Service: ?[place], [name] 11/07/2021 2:15 PM ?Medical Record Number: [id number] ?Patient Account Number: [id number] ?Date of Birth/Sex: Treating RN: ?07-31-1940 (81 y.o. F) Deaton, Bobbi ?Primary Care Provider: Shon Baton Other Clinician: ?Referring Provider: ?Treating Provider/Extender: Linton Ham ?Shon Baton ?Weeks in Treatment: 3 ?Constitutional ?Sitting or standing Blood Pressure is within target range for patient.. Pulse regular and within target range for patient.Marland Kitchen Respirations regular, non-labored and ?within target range.. Temperature is normal and within the target range for the patient.Marland Kitchen Appears in no distress. ?Cardiovascular ?Pedal pulses are palpable on the left. ?Notes ?Wound exam; left lower extremity. The wound is dehisced on the lateral aspect. Completely necrotic surface I used a #5 curette to remove as much necrotic ?subcutaneous debris as the patient could stand on. Hemostasis was direct pressure but the bleeding was minimal ?Electronic Signature(s) ?Signed: 11/07/2021 4:01:11 PM By: Linton Ham MD ?Entered By: Linton Ham on 11/07/2021 15:49:27 ?-------------------------------------------------------------------------------- ?Physician Orders Details ?Patient Name: Date of Service: ?[place], [name] 11/07/2021 2:15 PM ?Medical Record Number: [id number] ?Patient Account Number: [id number] ?Date of Birth/Sex: Treating RN: ?1940/12/07 (81 y.o. [name], [name] ?Primary Care Provider: Shon Baton Other Clinician: ?Referring Provider: ?Treating Provider/Extender: Linton Ham ?Shon Baton ?Weeks in  Treatment: 3 ?Verbal / Phone Orders: No ?Diagnosis Coding ?Follow-up Appointments ?ppointment in 2 weeks. - on Friday with Dr. Heber Cobden and Allayne Butcher Room # 9 ?Return A ?Nurse Visit: - next Wednesday with Leveda Anna @ 0845 ?Bathing/ Shower/ Hygiene ?May shower and wash wound with soap and water. ?Edema Control - Lymphedema / SCD / Other ?Elevate legs to the level of the heart or above for 30 minutes daily and/or when sitting, a frequency of: - elevate legs when sitting ?Avoid standing for long periods of time. ?Moisturize legs daily. - keep skin soft with moisturizer, do not get moisturizer on wound bed ?Additional Orders / Instructions ?Follow Nutritious Diet - increase protein intake ?Wound Treatment ?Wound #4 - Lower Leg Wound Laterality: Left, Anterior ?Cleanser: Soap and Water 1 x Per Day/15 Days ?Discharge Instructions: May shower and wash wound with dial antibacterial soap and water prior to dressing change. ?Cleanser: Wound Cleanser (Generic) 1 x Per Day/15 Days ?Discharge Instructions: Cleanse the wound with wound cleanser prior to applying a clean dressing using gauze sponges, not tissue or cotton balls. ?Topical: Gentamicin 1 x Per Day/15 Days ?Discharge Instructions: apply to wound bed at dressing change ?Prim Dressing: Hydrofera Blue Ready Foam, 4x5 in (Generic) 1 x Per Day/15 Days ?ary ?Discharge Instructions: Apply to wound bed as instructed ?Secondary Dressing: Woven Gauze Sponge, Non-Sterile 4x4 in (Generic) 1 x Per Day/15 Days ?Discharge Instructions: Apply over primary dressing as directed. ?Secondary Dressing: Zetuvit Plus 4x8 in 1 x Per Day/15 Days ?Discharge Instructions: Apply over primary dressing as directed. ?Compression Wrap: Kerlix Roll 4.5x3.1 (in/yd) (Generic) 1 x Per Day/15 Days ?Discharge Instructions: Apply Kerlix and Coban compression as directed. ?Compression Wrap: Coban Self-Adherent Wrap 4x5 (in/yd) (Generic) 1 x Per Day/15 Days ?Discharge Instructions: Apply over Kerlix as  directed. ?Electronic Signature(s) ?Signed: 11/07/2021 3:54:37 PM By: Rhae Hammock RN ?Signed: 11/07/2021 4:01:11 PM By: Linton Ham MD ?Entered By: Rhae Hammock on 11/07/2021 15:24:45 ?---------------------

## 2021-11-07 NOTE — Progress Notes (Signed)
Stacey [name], Stacey [name]. ([id number]) ?Visit Report for 11/07/2021 ?Arrival Information Details ?Patient Name: Date of Service: ?Stacey [name], Stacey [name]. 11/07/2021 2:15 PM ?Medical Record Number: [id number] ?Patient Account Number: [id number] ?Date of Birth/Sex: Treating RN: ?[birth year] (81 y.o. Stacey [name], [name] ?Primary Care Endiya Klahr: Shon Baton Other Clinician: ?Referring Annete Ayuso: ?Treating Kwadwo Taras/Extender: Linton Ham ?Shon Baton ?Weeks in Treatment: 3 ?Visit Information History Since Last Visit ?Added or deleted any medications: No ?Patient Arrived: Stacey [name] ?Any new allergies or adverse reactions: No ?Arrival Time: 15:03 ?Had a fall or experienced change in No ?Accompanied By: self ?activities of daily living that may affect ?Transfer Assistance: None ?risk of falls: ?Patient Identification Verified: Yes ?Signs or symptoms of abuse/neglect since last visito No ?Secondary Verification Process Completed: Yes ?Hospitalized since last visit: No ?Patient Requires Transmission-Based Precautions: No ?Implantable device outside of the clinic excluding No ?cellular tissue based products placed in the center ?since last visit: ?Has Dressing in Place as Prescribed: Yes ?Has Compression in Place as Prescribed: Yes ?Pain Present Now: No ?Electronic Signature(s) ?Signed: 11/07/2021 3:54:37 PM By: Rhae Hammock RN ?Entered By: Rhae Hammock on 11/07/2021 15:03:30 ?-------------------------------------------------------------------------------- ?Encounter Discharge Information Details ?Patient Name: Date of Service: ?Stacey [name], Stacey [name]. 11/07/2021 2:15 PM ?Medical Record Number: [id number] ?Patient Account Number: [id number] ?Date of Birth/Sex: Treating RN: ?[birth year] (81 y.o. Stacey [name], [name] ?Primary Care Dao Mearns: Shon Baton Other Clinician: ?Referring Eldred Lievanos: ?Treating Ruthellen Tippy/Extender: Linton Ham ?Shon Baton ?Weeks in Treatment: 3 ?Encounter Discharge Information Items Post Procedure Vitals ?Discharge  Condition: Stable ?Temperature (F): 98.1 ?Ambulatory Status: Ambulatory ?Pulse (bpm): 74 ?Discharge Destination: Home ?Respiratory Rate (breaths/min): 17 ?Transportation: Private Auto ?Blood Pressure (mmHg): 134/74 ?Accompanied By: self ?Schedule Follow-up Appointment: Yes ?Clinical Summary of Care: Patient Declined ?Electronic Signature(s) ?Signed: 11/07/2021 3:54:37 PM By: Rhae Hammock RN ?Entered By: Rhae Hammock on 11/07/2021 15:18:21 ?-------------------------------------------------------------------------------- ?Lower Extremity Assessment Details ?Patient Name: ?Date of Service: ?Stacey [name], Stacey [name]. 11/07/2021 2:15 PM ?Medical Record Number: [id number] ?Patient Account Number: [id number] ?Date of Birth/Sex: ?Treating RN: ?[birth year] (81 y.o. Stacey [name], [name] ?Primary Care Bernadette Armijo: Shon Baton ?Other Clinician: ?Referring Herberto Ledwell: ?Treating Jalal Rauch/Extender: Linton Ham ?Shon Baton ?Weeks in Treatment: 3 ?Edema Assessment ?Assessed: [Left: Yes] [Right: No] ?Edema: [Left: Ye] [Right: s] ?Calf ?Left: Right: ?Point of Measurement: 32 cm From Medial Instep 31 cm ?Ankle ?Left: Right: ?Point of Measurement: 11 cm From Medial Instep 19 cm ?Vascular Assessment ?Pulses: ?Dorsalis Pedis ?Palpable: [Left:Yes] ?Electronic Signature(s) ?Signed: 11/07/2021 3:54:37 PM By: Rhae Hammock RN ?Entered By: Rhae Hammock on 11/07/2021 15:08:09 ?-------------------------------------------------------------------------------- ?Multi Wound Chart Details ?Patient Name: ?Date of Service: ?Stacey [name], Stacey [name]. 11/07/2021 2:15 PM ?Medical Record Number: [id number] ?Patient Account Number: [id number] ?Date of Birth/Sex: ?Treating RN: ?1941-01-27 (81 y.o. F) Deaton, Bobbi ?Primary Care Ricky Gallery: Shon Baton ?Other Clinician: ?Referring Kiree Dejarnette: ?Treating Efstathios Sawin/Extender: Linton Ham ?Shon Baton ?Weeks in Treatment: 3 ?Vital Signs ?Height(in): 65 ?Pulse(bpm): 79 ?Weight(lbs): 133 ?Blood Pressure(mmHg):  133/66 ?Body Mass Index(BMI): 22.1 ?Temperature(??F): 98.1 ?Respiratory Rate(breaths/min): 17 ?Photos: [N/A:N/A] ?Left, Anterior Lower Leg N/A N/A ?Wound Location: ?Trauma N/A N/A ?Wounding Event: ?Trauma, Other N/A N/A ?Primary Etiology: ?Rheumatoid Arthritis N/A N/A ?Comorbid History: ?10/08/2021 N/A N/A ?Date Acquired: ?3 N/A N/A ?Weeks of Treatment: ?Open N/A N/A ?Wound Status: ?No N/A N/A ?Wound Recurrence: ?7.5x2x0.2 N/A N/A ?Measurements L x W x D (cm) ?11.781 N/A N/A ?A (cm?) : ?rea ?2.356 N/A N/A ?Volume (cm?) : ?71.00% N/A N/A ?% Reduction in A rea: ?71.00% N/A N/A ?% Reduction in Volume: ?Full Thickness Without Exposed N/A N/A ?  Classification: ?Support Structures ?Medium N/A N/A ?Exudate A mount: ?Serosanguineous N/A N/A ?Exudate Type: ?red, brown N/A N/A ?Exudate Color: ?Flat and Intact N/A N/A ?Wound Margin: ?Small (1-33%) N/A N/A ?Granulation A mount: ?Red N/A N/A ?Granulation Quality: ?Large (67-100%) N/A N/A ?Necrotic A mount: ?Eschar, Adherent Slough N/A N/A ?Necrotic Tissue: ?Fat Layer (Subcutaneous Tissue): Yes N/A N/A ?Exposed Structures: ?Small (1-33%) N/A N/A ?Epithelialization: ?Debridement - Excisional N/A N/A ?Debridement: ?Pre-procedure Verification/Time Out 15:15 N/A N/A ?Taken: ?Lidocaine N/A N/A ?Pain Control: ?Subcutaneous, Slough N/A N/A ?Tissue Debrided: ?Skin/Subcutaneous Tissue N/A N/A ?Level: ?15 N/A N/A ?Debridement A (sq cm): ?rea ?Curette N/A N/A ?Instrument: ?Minimum N/A N/A ?Bleeding: ?Pressure N/A N/A ?Hemostasis A chieved: ?0 N/A N/A ?Procedural Pain: ?0 N/A N/A ?Post Procedural Pain: ?Procedure was tolerated well N/A N/A ?Debridement Treatment Response: ?7.5x2x0.2 N/A N/A ?Post Debridement Measurements L x ?W x D (cm) ?2.356 N/A N/A ?Post Debridement Volume: (cm?) ?Debridement N/A N/A ?Procedures Performed: ?Treatment Notes ?Wound #4 (Lower Leg) Wound Laterality: Left, Anterior ?Cleanser ?Soap and Water ?Discharge Instruction: May shower and wash wound with dial  antibacterial soap and water prior to dressing change. ?Wound Cleanser ?Discharge Instruction: Cleanse the wound with wound cleanser prior to applying a clean dressing using gauze sponges, not tissue or cotton balls. ?Peri-Wound Care ?Topical ?Gentamicin ?Discharge Instruction: apply to wound bed at dressing change ?Primary Dressing ?Hydrofera Blue Ready Foam, 4x5 in ?Discharge Instruction: Apply to wound bed as instructed ?Secondary Dressing ?Woven Gauze Sponge, Non-Sterile 4x4 in ?Discharge Instruction: Apply over primary dressing as directed. ?Zetuvit Plus 4x8 in ?Discharge Instruction: Apply over primary dressing as directed. ?Secured With ?Compression Wrap ?Kerlix Roll 4.5x3.1 (in/yd) ?Discharge Instruction: Apply Kerlix and Coban compression as directed. ?Coban Self-Adherent Wrap 4x5 (in/yd) ?Discharge Instruction: Apply over Kerlix as directed. ?Compression Stockings ?Add-Ons ?Electronic Signature(s) ?Signed: 11/07/2021 4:01:11 PM By: Linton Ham MD ?Signed: 11/07/2021 4:52:29 PM By: Deon Pilling RN, BSN ?Entered By: Linton Ham on 11/07/2021 15:46:19 ?-------------------------------------------------------------------------------- ?Multi-Disciplinary Care Plan Details ?Patient Name: ?Date of Service: ?Stacey [name], Stacey [name]. 11/07/2021 2:15 PM ?Medical Record Number: [id number] ?Patient Account Number: [id number] ?Date of Birth/Sex: ?Treating RN: ?June 13, 1941 (81 y.o. Stacey [name], [name] ?Primary Care Jearldean Gutt: Shon Baton ?Other Clinician: ?Referring Yaeko Fazekas: ?Treating Mayrene Bastarache/Extender: Linton Ham ?Shon Baton ?Weeks in Treatment: 3 ?Active Inactive ?Abuse / Safety / Falls / Self Care Management ?Nursing Diagnoses: ?History of Falls ?Potential for injury related to falls ?Goals: ?Patient will remain injury free related to falls ?Date Initiated: 10/16/2021 ?Target Resolution Date: 11/13/2021 ?Goal Status: Active ?Interventions: ?Provide education on fall prevention ?Notes: ?Nutrition ?Nursing  Diagnoses: ?Potential for alteratiion in Nutrition/Potential for imbalanced nutrition ?Goals: ?Patient/caregiver agrees to and verbalizes understanding of need to use nutritional supplements and/or vitamins as prescribed ?Date Initia

## 2021-11-08 DIAGNOSIS — J849 Interstitial pulmonary disease, unspecified: Secondary | ICD-10-CM | POA: Diagnosis not present

## 2021-11-08 DIAGNOSIS — R296 Repeated falls: Secondary | ICD-10-CM | POA: Diagnosis not present

## 2021-11-08 DIAGNOSIS — M0510 Rheumatoid lung disease with rheumatoid arthritis of unspecified site: Secondary | ICD-10-CM | POA: Diagnosis not present

## 2021-11-08 DIAGNOSIS — M80012D Age-related osteoporosis with current pathological fracture, left shoulder, subsequent encounter for fracture with routine healing: Secondary | ICD-10-CM | POA: Diagnosis not present

## 2021-11-08 DIAGNOSIS — S71102D Unspecified open wound, left thigh, subsequent encounter: Secondary | ICD-10-CM | POA: Diagnosis not present

## 2021-11-08 DIAGNOSIS — S81812D Laceration without foreign body, left lower leg, subsequent encounter: Secondary | ICD-10-CM | POA: Diagnosis not present

## 2021-11-09 DIAGNOSIS — J449 Chronic obstructive pulmonary disease, unspecified: Secondary | ICD-10-CM | POA: Diagnosis not present

## 2021-11-09 DIAGNOSIS — E785 Hyperlipidemia, unspecified: Secondary | ICD-10-CM | POA: Diagnosis not present

## 2021-11-09 DIAGNOSIS — M810 Age-related osteoporosis without current pathological fracture: Secondary | ICD-10-CM | POA: Diagnosis not present

## 2021-11-09 DIAGNOSIS — I10 Essential (primary) hypertension: Secondary | ICD-10-CM | POA: Diagnosis not present

## 2021-11-10 DIAGNOSIS — M0510 Rheumatoid lung disease with rheumatoid arthritis of unspecified site: Secondary | ICD-10-CM | POA: Diagnosis not present

## 2021-11-10 DIAGNOSIS — M80012D Age-related osteoporosis with current pathological fracture, left shoulder, subsequent encounter for fracture with routine healing: Secondary | ICD-10-CM | POA: Diagnosis not present

## 2021-11-10 DIAGNOSIS — S71102D Unspecified open wound, left thigh, subsequent encounter: Secondary | ICD-10-CM | POA: Diagnosis not present

## 2021-11-10 DIAGNOSIS — J849 Interstitial pulmonary disease, unspecified: Secondary | ICD-10-CM | POA: Diagnosis not present

## 2021-11-10 DIAGNOSIS — R296 Repeated falls: Secondary | ICD-10-CM | POA: Diagnosis not present

## 2021-11-10 DIAGNOSIS — S81812D Laceration without foreign body, left lower leg, subsequent encounter: Secondary | ICD-10-CM | POA: Diagnosis not present

## 2021-11-11 DIAGNOSIS — J9621 Acute and chronic respiratory failure with hypoxia: Secondary | ICD-10-CM | POA: Diagnosis not present

## 2021-11-11 DIAGNOSIS — H532 Diplopia: Secondary | ICD-10-CM | POA: Diagnosis not present

## 2021-11-11 DIAGNOSIS — D692 Other nonthrombocytopenic purpura: Secondary | ICD-10-CM | POA: Diagnosis not present

## 2021-11-11 DIAGNOSIS — J849 Interstitial pulmonary disease, unspecified: Secondary | ICD-10-CM | POA: Diagnosis not present

## 2021-11-11 DIAGNOSIS — J449 Chronic obstructive pulmonary disease, unspecified: Secondary | ICD-10-CM | POA: Diagnosis not present

## 2021-11-11 DIAGNOSIS — K5900 Constipation, unspecified: Secondary | ICD-10-CM | POA: Diagnosis not present

## 2021-11-11 DIAGNOSIS — E785 Hyperlipidemia, unspecified: Secondary | ICD-10-CM | POA: Diagnosis not present

## 2021-11-11 DIAGNOSIS — Z4800 Encounter for change or removal of nonsurgical wound dressing: Secondary | ICD-10-CM | POA: Diagnosis not present

## 2021-11-11 DIAGNOSIS — R296 Repeated falls: Secondary | ICD-10-CM | POA: Diagnosis not present

## 2021-11-11 DIAGNOSIS — I872 Venous insufficiency (chronic) (peripheral): Secondary | ICD-10-CM | POA: Diagnosis not present

## 2021-11-11 DIAGNOSIS — I10 Essential (primary) hypertension: Secondary | ICD-10-CM | POA: Diagnosis not present

## 2021-11-11 DIAGNOSIS — S81812D Laceration without foreign body, left lower leg, subsequent encounter: Secondary | ICD-10-CM | POA: Diagnosis not present

## 2021-11-11 DIAGNOSIS — R42 Dizziness and giddiness: Secondary | ICD-10-CM | POA: Diagnosis not present

## 2021-11-11 DIAGNOSIS — I700 Atherosclerosis of aorta: Secondary | ICD-10-CM | POA: Diagnosis not present

## 2021-11-11 DIAGNOSIS — I771 Stricture of artery: Secondary | ICD-10-CM | POA: Diagnosis not present

## 2021-11-11 DIAGNOSIS — K219 Gastro-esophageal reflux disease without esophagitis: Secondary | ICD-10-CM | POA: Diagnosis not present

## 2021-11-11 DIAGNOSIS — M810 Age-related osteoporosis without current pathological fracture: Secondary | ICD-10-CM | POA: Diagnosis not present

## 2021-11-11 DIAGNOSIS — B0223 Postherpetic polyneuropathy: Secondary | ICD-10-CM | POA: Diagnosis not present

## 2021-11-11 DIAGNOSIS — Z8616 Personal history of COVID-19: Secondary | ICD-10-CM | POA: Diagnosis not present

## 2021-11-11 DIAGNOSIS — M80012D Age-related osteoporosis with current pathological fracture, left shoulder, subsequent encounter for fracture with routine healing: Secondary | ICD-10-CM | POA: Diagnosis not present

## 2021-11-11 DIAGNOSIS — M0510 Rheumatoid lung disease with rheumatoid arthritis of unspecified site: Secondary | ICD-10-CM | POA: Diagnosis not present

## 2021-11-11 DIAGNOSIS — N393 Stress incontinence (female) (male): Secondary | ICD-10-CM | POA: Diagnosis not present

## 2021-11-11 DIAGNOSIS — S71102D Unspecified open wound, left thigh, subsequent encounter: Secondary | ICD-10-CM | POA: Diagnosis not present

## 2021-11-11 DIAGNOSIS — J4540 Moderate persistent asthma, uncomplicated: Secondary | ICD-10-CM | POA: Diagnosis not present

## 2021-11-11 DIAGNOSIS — F419 Anxiety disorder, unspecified: Secondary | ICD-10-CM | POA: Diagnosis not present

## 2021-11-12 ENCOUNTER — Encounter (HOSPITAL_BASED_OUTPATIENT_CLINIC_OR_DEPARTMENT_OTHER): Payer: Medicare Other | Attending: Physician Assistant | Admitting: Physician Assistant

## 2021-11-12 DIAGNOSIS — S81812A Laceration without foreign body, left lower leg, initial encounter: Secondary | ICD-10-CM | POA: Diagnosis not present

## 2021-11-12 DIAGNOSIS — S80812A Abrasion, left lower leg, initial encounter: Secondary | ICD-10-CM | POA: Diagnosis not present

## 2021-11-12 DIAGNOSIS — J849 Interstitial pulmonary disease, unspecified: Secondary | ICD-10-CM | POA: Diagnosis not present

## 2021-11-12 DIAGNOSIS — S7012XA Contusion of left thigh, initial encounter: Secondary | ICD-10-CM | POA: Diagnosis not present

## 2021-11-12 DIAGNOSIS — S7002XA Contusion of left hip, initial encounter: Secondary | ICD-10-CM | POA: Diagnosis not present

## 2021-11-12 DIAGNOSIS — M069 Rheumatoid arthritis, unspecified: Secondary | ICD-10-CM | POA: Insufficient documentation

## 2021-11-12 DIAGNOSIS — L97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 with unspecified severity: Secondary | ICD-10-CM | POA: Diagnosis not present

## 2021-11-12 DIAGNOSIS — T798XXA Other early complications of trauma, initial encounter: Secondary | ICD-10-CM | POA: Insufficient documentation

## 2021-11-12 DIAGNOSIS — W19XXXA Unspecified fall, initial encounter: Secondary | ICD-10-CM | POA: Diagnosis not present

## 2021-11-12 NOTE — Progress Notes (Signed)
Stacey [name], Stacey [name]. ([id number]) ?Visit Report for 11/12/2021 ?Arrival Information Details ?Patient Name: Date of Service: ?Stacey [name], Stacey [name]. 11/12/2021 8:45 [name] ?Medical Record Number: [id number] ?Patient Account Number: [id number] ?Date of Birth/Sex: Treating RN: ?03/24/1941 (81 y.o. Stacey [name] ?Primary Care Stacey [name]: Stacey [name] Other Clinician: ?Referring Stacey [name]: ?Treating Stacey [name]: Stacey [name] ?Stacey [name] ?Stacey [name]: 3 ?Visit Information History Since Last Visit ?Added or deleted any medications: No ?Patient Arrived: Stacey [name] ?Any new allergies or adverse reactions: No ?Arrival Time: 08:52 ?Had a fall or experienced change in No ?Transfer Assistance: None ?activities of daily living that may affect ?Patient Identification Verified: Yes ?risk of falls: ?Secondary Verification Process Completed: Yes ?Signs or symptoms of abuse/neglect since last visito No ?Patient Requires Transmission-Based Precautions: No ?Hospitalized since last visit: No ?Implantable device outside of the clinic excluding No ?cellular tissue based products placed in the center ?since last visit: ?Has Dressing in Place as Prescribed: Yes ?Has Compression in Place as Prescribed: Yes ?Pain Present Now: No ?Electronic Signature(s) ?Signed: 11/12/2021 5:49:17 PM By: Stacey [name] ?Entered By: Stacey [name] on 11/12/2021 09:21:38 ?-------------------------------------------------------------------------------- ?Clinic Level of Care Assessment Details ?Patient Name: Date of Service: ?Stacey [name], Stacey [name]. 11/12/2021 8:45 [name] ?Medical Record Number: [id number] ?Patient Account Number: [id number] ?Date of Birth/Sex: Treating RN: ?Feb 15, 1941 (81 y.o. Stacey [name] ?Primary Care London Tarnowski: Stacey [name] Other Clinician: ?Referring Yianni [name]: ?Treating Estalene Bergey/Extender: Stacey [name] ?Stacey [name] ?Stacey [name]: 3 ?Clinic Level of Care Assessment Items ?TOOL 4 Quantity Score ?X- 1 0 ?Use when only an EandM is performed on  FOLLOW-UP visit ?ASSESSMENTS - Nursing Assessment / Reassessment ?X- 1 10 ?Reassessment of Co-morbidities (includes updates in patient status) ?X- 1 5 ?Reassessment of Adherence to [name] Plan ?ASSESSMENTS - Wound and Skin A ssessment / Reassessment ?X - Simple Wound Assessment / Reassessment - one wound 1 5 ?[] - 0 ?Complex Wound Assessment / Reassessment - multiple wounds ?[] - 0 ?Dermatologic / Skin Assessment (not related to wound area) ?ASSESSMENTS - Focused Assessment ?[] - 0 ?Circumferential Edema Measurements - multi extremities ?[] - 0 ?Nutritional Assessment / Counseling / Intervention ?[] - 0 ?Lower Extremity Assessment (monofilament, tuning fork, pulses) ?[] - 0 ?Peripheral Arterial Disease Assessment (using hand held doppler) ?ASSESSMENTS - Ostomy and/or Continence Assessment and Care ?[] - 0 ?Incontinence Assessment and Management ?[] - 0 ?Ostomy Care Assessment and Management (repouching, etc.) ?PROCESS - Coordination of Care ?[] - 0 ?Simple Patient / Family Education for ongoing care ?X- 1 20 ?Complex (extensive) Patient / Family Education for ongoing care ?[] - 0 ?Staff obtains Consents, Records, T Results / Process Orders ?est ?[] - 0 ?Staff telephones Stacey [name], Nursing Homes / Clarify orders / etc ?[] - 0 ?Routine Transfer to another Facility (non-emergent condition) ?[] - 0 ?Routine Hospital Admission (non-emergent condition) ?[] - 0 ?New Admissions / Biomedical engineer / Ordering NPWT Apligraf, etc. ?, ?[] - 0 ?Emergency Hospital Admission (emergent condition) ?[] - 0 ?Simple Discharge Coordination ?[] - 0 ?Complex (extensive) Discharge Coordination ?PROCESS - Special Needs ?[] - 0 ?Pediatric / Minor Patient Management ?[] - 0 ?Isolation Patient Management ?[] - 0 ?Hearing / Language / Visual special needs ?[] - 0 ?Assessment of Community assistance (transportation, D/C planning, etc.) ?[] - 0 ?Additional assistance / Altered mentation ?[] - 0 ?Support Surface(s) Assessment (bed, cushion,  seat, etc.) ?INTERVENTIONS - Wound Cleansing / Measurement ?X - Simple Wound Cleansing - one wound 1 5 ?[] - 0 ?Complex Wound Cleansing -  multiple wounds ?X- 1 5 ?Wound Imaging (photographs - any number of wounds) ?[] - 0 ?Wound Tracing (instead of photographs) ?X- 1 5 ?Simple Wound Measurement - one wound ?[] - 0 ?Complex Wound Measurement - multiple wounds ?INTERVENTIONS - Wound Dressings ?[] - 0 ?Small Wound Dressing one or multiple wounds ?[] - 0 ?Medium Wound Dressing one or multiple wounds ?X- 1 20 ?Large Wound Dressing one or multiple wounds ?[] - 0 ?Application of Medications - topical ?[] - 0 ?Application of Medications - injection ?INTERVENTIONS - Miscellaneous ?[] - 0 ?External ear exam ?[] - 0 ?Specimen Collection (cultures, biopsies, blood, body fluids, etc.) ?[] - 0 ?Specimen(s) / Culture(s) sent or taken to Lab for analysis ?[] - 0 ?Patient Transfer (multiple staff / Hoyer Lift / Similar devices) ?[] - 0 ?Simple Staple / Suture removal (25 or less) ?[] - 0 ?Complex Staple / Suture removal (26 or more) ?[] - 0 ?Hypo / Hyperglycemic Management (close monitor of Blood Glucose) ?[] - 0 ?Ankle / Brachial Index (ABI) - do not check if billed separately ?X- 1 5 ?Vital Signs ?Has the patient been seen at the hospital within the last three years: Yes ?Total Score: 80 ?Level Of Care: New/Established - Level 3 ?Electronic Signature(s) ?Signed: 11/12/2021 5:49:17 PM By: Stacey [name] ?Entered By: Stacey [name] on 11/12/2021 09:21:17 ?-------------------------------------------------------------------------------- ?Encounter Discharge Information Details ?Patient Name: Date of Service: ?Stacey [name], Stacey [name]. 11/12/2021 8:45 [name] ?Medical Record Number: [id number] ?Patient Account Number: [id number] ?Date of Birth/Sex: Treating RN: ?10/24/1940 (80 y.o. F) Stacey [name] ?Primary Care Provider: Russo, John Other Clinician: ?Referring Provider: ?Treating Provider/Extender: Stone III, Hoyt ?Russo, John ?Stacey [name]:  3 ?Encounter Discharge Information Items ?Discharge Condition: Stable ?Ambulatory Status: Cane ?Discharge Destination: Home ?Transportation: Private Auto ?Schedule Follow-up Appointment: Yes ?Clinical Summary of Care: Patient Declined ?Electronic Signature(s) ?Signed: 11/12/2021 5:49:17 PM By: Stacey [name] ?Entered By: Stacey [name] on 11/12/2021 09:20:45 ?-------------------------------------------------------------------------------- ?Patient/Caregiver Education Details ?Patient Name: Date of Service: ?Stacey [name], Stacey [name]. 5/3/2023andnbsp8:45 [name] ?Medical Record Number: [id number] ?Patient Account Number: [id number] ?Date of Birth/Gender: Treating RN: ?06/13/1941 (80 y.o. F) Stacey [name] ?Primary Care Physician: Russo, John Other Clinician: ?Referring Physician: ?Treating Physician/Extender: Stone III, Hoyt ?Russo, John ?Stacey [name]: 3 ?Education Assessment ?Education Provided To: ?Patient ?Education Topics Provided ?Safety: ?Methods: Explain/Verbal ?Responses: State content correctly ?Venous: ?Methods: Explain/Verbal, Printed ?Responses: State content correctly ?Wound/Skin Impairment: ?Methods: Explain/Verbal, Printed ?Responses: State content correctly ?Electronic Signature(s) ?Signed: 11/12/2021 5:49:17 PM By: Stacey [name] ?Entered By: Stacey [name] on 11/12/2021 09:20:32 ?-------------------------------------------------------------------------------- ?Wound Assessment Details ?Patient Name: ?Date of Service: ?Stacey [name], Stacey [name]. 11/12/2021 8:45 [name] ?Medical Record Number: [id number] ?Patient Account Number: [id number] ?Date of Birth/Sex: ?Treating RN: ?05/06/1941 (80 y.o. F) Stacey [name] ?Primary Care Provider: Russo, John ?Other Clinician: ?Referring Provider: ?Treating Provider/Extender: Stone III, Hoyt ?Russo, John ?Stacey [name]: 3 ?Wound Status ?Wound Number: 4 ?Primary Etiology: Trauma, Other ?Wound Location: Left, Anterior Lower Leg ?Wound Status: Open ?Wounding Event: Trauma ?Comorbid  History: Rheumatoid Arthritis ?Date Acquired: 10/08/2021 ?Stacey Of [name]: 3 ?Clustered Wound: No ?Photos ?Wound Measurements ?Length: (cm) 7.1 ?Width: (cm) 2 ?Depth: (cm) 0.2 ?Area: (cm?) 11.153 ?Volume: (cm

## 2021-11-12 NOTE — Progress Notes (Signed)
YETUNDE, LEIS. ([id number]) ?Visit Report for 11/12/2021 ?SuperBill Details ?Patient Name: Date of Service: ?Glosser, [name] 11/12/2021 ?Medical Record Number: [id number] ?Patient Account Number: [id number] ?Date of Birth/Sex: Treating RN: ?03-Apr-1941 (81 y.o. 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

## 2021-11-17 DIAGNOSIS — M7989 Other specified soft tissue disorders: Secondary | ICD-10-CM | POA: Diagnosis not present

## 2021-11-17 DIAGNOSIS — M80012D Age-related osteoporosis with current pathological fracture, left shoulder, subsequent encounter for fracture with routine healing: Secondary | ICD-10-CM | POA: Diagnosis not present

## 2021-11-17 DIAGNOSIS — S71102D Unspecified open wound, left thigh, subsequent encounter: Secondary | ICD-10-CM | POA: Diagnosis not present

## 2021-11-17 DIAGNOSIS — R600 Localized edema: Secondary | ICD-10-CM | POA: Diagnosis not present

## 2021-11-17 DIAGNOSIS — M0510 Rheumatoid lung disease with rheumatoid arthritis of unspecified site: Secondary | ICD-10-CM | POA: Diagnosis not present

## 2021-11-17 DIAGNOSIS — M05711 Rheumatoid arthritis with rheumatoid factor of right shoulder without organ or systems involvement: Secondary | ICD-10-CM | POA: Diagnosis not present

## 2021-11-17 DIAGNOSIS — R296 Repeated falls: Secondary | ICD-10-CM | POA: Diagnosis not present

## 2021-11-17 DIAGNOSIS — S81812D Laceration without foreign body, left lower leg, subsequent encounter: Secondary | ICD-10-CM | POA: Diagnosis not present

## 2021-11-17 DIAGNOSIS — D8989 Other specified disorders involving the immune mechanism, not elsewhere classified: Secondary | ICD-10-CM | POA: Diagnosis not present

## 2021-11-17 DIAGNOSIS — J849 Interstitial pulmonary disease, unspecified: Secondary | ICD-10-CM | POA: Diagnosis not present

## 2021-11-19 DIAGNOSIS — M7989 Other specified soft tissue disorders: Secondary | ICD-10-CM | POA: Diagnosis not present

## 2021-11-19 DIAGNOSIS — M0579 Rheumatoid arthritis with rheumatoid factor of multiple sites without organ or systems involvement: Secondary | ICD-10-CM | POA: Diagnosis not present

## 2021-11-19 DIAGNOSIS — Z79899 Other long term (current) drug therapy: Secondary | ICD-10-CM | POA: Diagnosis not present

## 2021-11-21 ENCOUNTER — Encounter (HOSPITAL_BASED_OUTPATIENT_CLINIC_OR_DEPARTMENT_OTHER): Payer: Medicare Other | Admitting: Internal Medicine

## 2021-11-21 DIAGNOSIS — M069 Rheumatoid arthritis, unspecified: Secondary | ICD-10-CM | POA: Diagnosis not present

## 2021-11-21 DIAGNOSIS — L97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 with unspecified severity: Secondary | ICD-10-CM | POA: Diagnosis not present

## 2021-11-21 DIAGNOSIS — T798XXA Other early complications of trauma, initial encounter: Secondary | ICD-10-CM | POA: Diagnosis not present

## 2021-11-21 DIAGNOSIS — S81812D Laceration without foreign body, left lower leg, subsequent encounter: Secondary | ICD-10-CM | POA: Diagnosis not present

## 2021-11-21 DIAGNOSIS — S7002XA Contusion of left hip, initial encounter: Secondary | ICD-10-CM | POA: Diagnosis not present

## 2021-11-21 DIAGNOSIS — J849 Interstitial pulmonary disease, unspecified: Secondary | ICD-10-CM | POA: Diagnosis not present

## 2021-11-21 DIAGNOSIS — S80812A Abrasion, left lower leg, initial encounter: Secondary | ICD-10-CM | POA: Diagnosis not present

## 2021-11-21 DIAGNOSIS — S71102D Unspecified open wound, left thigh, subsequent encounter: Secondary | ICD-10-CM | POA: Diagnosis not present

## 2021-11-21 DIAGNOSIS — M0510 Rheumatoid lung disease with rheumatoid arthritis of unspecified site: Secondary | ICD-10-CM | POA: Diagnosis not present

## 2021-11-21 DIAGNOSIS — R296 Repeated falls: Secondary | ICD-10-CM | POA: Diagnosis not present

## 2021-11-21 DIAGNOSIS — M80012D Age-related osteoporosis with current pathological fracture, left shoulder, subsequent encounter for fracture with routine healing: Secondary | ICD-10-CM | POA: Diagnosis not present

## 2021-11-24 NOTE — Progress Notes (Signed)
Stacey [name]. ([id number]) ?Visit Report for 11/21/2021 ?Chief Complaint Document Details ?Patient Name: Date of Service: ?[name], [name] 11/21/2021 9:30 A M ?Medical Record Number: [id number] ?Patient Account Number: [id number] ?Date of Birth/Sex: Treating RN: ?[birth year] (81 y.o. Stacey [name] ?Primary Care Provider: Shon [name] Other Clinician: ?Referring Provider: ?Treating Provider/Extender: Stacey [name] ?Stacey [name] ?Weeks in Treatment: 5 ?Information Obtained from: Patient ?Chief Complaint ?10/16/2021; left lower extremity wounds following trauma ?Electronic Signature(s) ?Signed: 11/24/2021 12:33:58 PM By: Stacey Shan DO ?Entered By: Stacey [name] on 11/24/2021 11:26:07 ?-------------------------------------------------------------------------------- ?Debridement Details ?Patient Name: Date of Service: ?[name], [name] 11/21/2021 9:30 A M ?Medical Record Number: [id number] ?Patient Account Number: [id number] ?Date of Birth/Sex: Treating RN: ?01-Sep-1940 (81 y.o. Stacey [name] ?Primary Care Provider: Shon [name] Other Clinician: ?Referring Provider: ?Treating Provider/Extender: Stacey [name] ?Stacey [name] ?Weeks in Treatment: 5 ?Debridement Performed for Assessment: Wound #4 Left,Anterior Lower Leg ?Performed By: Physician Stacey Shan, DO ?Debridement Type: Debridement ?Level of Consciousness (Pre-procedure): Awake and Alert ?Pre-procedure Verification/Time Out Yes - 10:51 ?Taken: ?Start Time: 10:52 ?Pain Control: ?Other : Benzocaine ?T Area Debrided (L x W): ?otal 7 (cm) x 1.6 (cm) = 11.2 (cm?) ?Tissue and other material debrided: [place], [place] ?Level: Skin/Subcutaneous Tissue ?Debridement Description: Excisional ?Instrument: Curette ?Bleeding: Minimum ?Hemostasis Achieved: Pressure ?End Time: 11:00 ?Response to Treatment: Procedure was tolerated well ?Level of Consciousness (Post- Awake and Alert ?procedure): ?Post Debridement Measurements of Total  Wound ?Length: (cm) 7 ?Width: (cm) 1.6 ?Depth: (cm) 0.2 ?Volume: (cm?) 1.759 ?Character of Wound/Ulcer Post Debridement: Stable ?Post Procedure Diagnosis ?Same as Pre-procedure ?Electronic Signature(s) ?Signed: 11/24/2021 12:33:58 PM By: Stacey Shan DO ?Signed: 11/24/2021 5:00:45 PM By: Stacey [name] ?Entered By: Stacey [name] on 11/21/2021 11:00:48 ?-------------------------------------------------------------------------------- ?HPI Details ?Patient Name: Date of Service: ?[name], [name] 11/21/2021 9:30 A M ?Medical Record Number: [id number] ?Patient Account Number: [id number] ?Date of Birth/Sex: Treating RN: ?Aug 02, 1940 (81 y.o. Stacey [name] ?Primary Care Provider: Shon [name] Other Clinician: ?Referring Provider: ?Treating Provider/Extender: Stacey [name] ?Stacey [name] ?Weeks in Treatment: 5 ?History of Present Illness ?HPI Description: Admission 10/16/2021 ?[name] is an 81 year old female with a past medical history of rheumatoid arthritis, interstitial lung disease that presents to the clinic for a 1 week ?history of wounds to her left lower extremity following a fall. She visited the ED following the trauma. She had 25 sutures placed to the left lower extremity ?wound. She also reports a hematoma to the left lateral thigh with an abrasion mark. She was started on doxycycline in the ED and she completed this. She ?currently denies signs of infection. ?4/13; patient admitted the clinic last week after a fall. She had a fairly sizable laceration on the left lower leg that was sutured in the ER. Sutures are still in ?place. Also problematic she has a large hematoma over the left lateral hip. Apparently she did not have any x-rays done when she was in the ER of this area ?because it only really showed up 24 to 48 hours later. We have been using gentamicin and Hydrofera Blue to both areas. ?4/20; patient presents for follow-up. She states that her orthopedic surgeon drained the hematoma to  her left lateral thigh. She has no open wounds to this area. ?She states she tolerated the compression wrap well up to the left lower extremity. She has no issues or complaints today. She denies signs of infection. ?4/28; laceration on the left lower extremity following a fall. Since I last saw  this the sutures have been removed but the wound is dehisced and the surface is ?necrotic. We have been using gentamicin and Hydrofera Blue ?She tells me that the hematoma was seen by orthopedic surgery and they drained this using a large bore needle. We did not look at this today. Finally she is ?going to [facility] for relatives graduation next Friday we will bring her in early for a nurse visit to change the dressing she will be back in time to be seen the ?week after next ?5/12; patient presents for follow-up. She has been using antibiotic ointment with Hydrofera Blue to the wound bed. She denies signs of infection. She has no ?issues or complaints today. ?Electronic Signature(s) ?Signed: 11/24/2021 12:33:58 PM By: Stacey Shan DO ?Entered By: Stacey [name] on 11/24/2021 11:27:26 ?-------------------------------------------------------------------------------- ?Physical Exam Details ?Patient Name: Date of Service: ?[name], [name] 11/21/2021 9:30 A M ?Medical Record Number: [id number] ?Patient Account Number: [id number] ?Date of Birth/Sex: Treating RN: ?[birth year] (81 y.o. Stacey [name] ?Primary Care Provider: Shon [name] Other Clinician: ?Referring Provider: ?Treating Provider/Extender: Stacey [name] ?Stacey [name] ?Weeks in Treatment: 5 ?Constitutional ?respirations regular, non-labored and within target range for patient.Marland Kitchen ?Cardiovascular ?2+ dorsalis pedis/posterior tibialis pulses. ?Psychiatric ?pleasant and cooperative. ?Notes ?Left lower extremity: T the anterior aspect there is an open wound with nonviable tissue and granulation tissue present. No surrounding signs of infection. ?o ?Electronic  Signature(s) ?Signed: 11/24/2021 12:33:58 PM By: Stacey Shan DO ?Entered By: Stacey [name] on 11/24/2021 11:28:13 ?-------------------------------------------------------------------------------- ?Physician Orders Details ?Patient Name: ?Date of Service: ?[place], [name] 11/21/2021 9:30 A M ?Medical Record Number: [id number] ?Patient Account Number: [id number] ?Date of Birth/Sex: ?Treating RN: ?08-07-1940 (81 y.o. 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 - on Friday 11/28/21 @ 10:15 with Dr. Heber [place] and Leveda Anna (Room 7) ?Return A ?Bathing/ Shower/ Hygiene ?May shower and wash wound with soap and water. ?Edema Control - Lymphedema / SCD / Other ?Elevate legs to the level of the heart or above for 30 minutes daily and/or when sitting, a frequency of: - elevate legs when sitting ?Avoid standing for long periods of time. ?Moisturize legs daily. - keep skin soft with moisturizer, do not get moisturizer on wound bed ?Additional Orders / Instructions ?Follow Nutritious Diet - increase protein intake ?Wound Treatment ?Wound #4 - Lower Leg Wound Laterality: Left, Anterior ?Cleanser: Soap and Water 1 x Per Day/15 Days ?Discharge Instructions: May shower and wash wound with dial antibacterial soap and water prior to dressing change. ?Cleanser: Wound Cleanser (Generic) 1 x Per Day/15 Days ?Discharge Instructions: Cleanse the wound with wound cleanser prior to  applying a clean dressing using gauze sponges, not tissue or cotton balls. ?Topical: Gentamicin 1 x Per Day/15 Days ?Discharge Instructions: apply to wound bed at dressing change ?Prim Dressing: Hydrofera Blue Ready Foam, 4x5 in

## 2021-11-25 DIAGNOSIS — R296 Repeated falls: Secondary | ICD-10-CM | POA: Diagnosis not present

## 2021-11-25 DIAGNOSIS — S71102D Unspecified open wound, left thigh, subsequent encounter: Secondary | ICD-10-CM | POA: Diagnosis not present

## 2021-11-25 DIAGNOSIS — S81812D Laceration without foreign body, left lower leg, subsequent encounter: Secondary | ICD-10-CM | POA: Diagnosis not present

## 2021-11-25 DIAGNOSIS — M0510 Rheumatoid lung disease with rheumatoid arthritis of unspecified site: Secondary | ICD-10-CM | POA: Diagnosis not present

## 2021-11-25 DIAGNOSIS — M80012D Age-related osteoporosis with current pathological fracture, left shoulder, subsequent encounter for fracture with routine healing: Secondary | ICD-10-CM | POA: Diagnosis not present

## 2021-11-25 DIAGNOSIS — J849 Interstitial pulmonary disease, unspecified: Secondary | ICD-10-CM | POA: Diagnosis not present

## 2021-11-26 DIAGNOSIS — S42002D Fracture of unspecified part of left clavicle, subsequent encounter for fracture with routine healing: Secondary | ICD-10-CM | POA: Diagnosis not present

## 2021-11-28 ENCOUNTER — Encounter (HOSPITAL_BASED_OUTPATIENT_CLINIC_OR_DEPARTMENT_OTHER): Payer: Medicare Other | Admitting: Internal Medicine

## 2021-11-28 DIAGNOSIS — M80012D Age-related osteoporosis with current pathological fracture, left shoulder, subsequent encounter for fracture with routine healing: Secondary | ICD-10-CM | POA: Diagnosis not present

## 2021-11-28 DIAGNOSIS — L97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 with unspecified severity: Secondary | ICD-10-CM | POA: Diagnosis not present

## 2021-11-28 DIAGNOSIS — T798XXA Other early complications of trauma, initial encounter: Secondary | ICD-10-CM | POA: Diagnosis not present

## 2021-11-28 DIAGNOSIS — S80812A Abrasion, left lower leg, initial encounter: Secondary | ICD-10-CM | POA: Diagnosis not present

## 2021-11-28 DIAGNOSIS — M0510 Rheumatoid lung disease with rheumatoid arthritis of unspecified site: Secondary | ICD-10-CM | POA: Diagnosis not present

## 2021-11-28 DIAGNOSIS — J849 Interstitial pulmonary disease, unspecified: Secondary | ICD-10-CM | POA: Diagnosis not present

## 2021-11-28 DIAGNOSIS — S7002XA Contusion of left hip, initial encounter: Secondary | ICD-10-CM | POA: Diagnosis not present

## 2021-11-28 DIAGNOSIS — S81812D Laceration without foreign body, left lower leg, subsequent encounter: Secondary | ICD-10-CM | POA: Diagnosis not present

## 2021-11-28 DIAGNOSIS — M069 Rheumatoid arthritis, unspecified: Secondary | ICD-10-CM | POA: Diagnosis not present

## 2021-11-28 DIAGNOSIS — S71102D Unspecified open wound, left thigh, subsequent encounter: Secondary | ICD-10-CM | POA: Diagnosis not present

## 2021-11-28 DIAGNOSIS — R296 Repeated falls: Secondary | ICD-10-CM | POA: Diagnosis not present

## 2021-11-28 NOTE — Progress Notes (Signed)
Stacey [name] ([id number]) Visit Report for 11/28/2021 Chief Complaint Document Details Patient Name: Date of Service: [place], [place] [facility] RET M. 11/28/2021 10:15 A M Medical Record Number: [id number] Patient Account Number: [id number] Date of Birth/Sex: Treating RN: 1941/01/19 (81 y.o. Stacey [name] Primary Care Provider: Shon [name] Other Clinician: Referring Provider: Treating Provider/Extender: Stacey [name] in Treatment: 6 Information Obtained from: Patient Chief Complaint 10/16/2021; left lower extremity wounds following trauma Electronic Signature(s) Signed: 11/28/2021 11:00:47 AM By: Stacey Shan DO Entered By: Stacey [name] on 11/28/2021 10:57:10 -------------------------------------------------------------------------------- Debridement Details Patient Name: Date of Service: Stacey [name],  RET M. 11/28/2021 10:15 A M Medical Record Number: [id number] Patient Account Number: [id number] Date of Birth/Sex: Treating RN: [birth year] (81 y.o. Stacey [name] Primary Care Provider: Shon [name] Other Clinician: Referring Provider: Treating Provider/Extender: Stacey [name] in Treatment: 6 Debridement Performed for Assessment: Wound #4 Left,Anterior Lower Leg Performed By: Physician Stacey Shan, DO Debridement Type: Debridement Level of Consciousness (Pre-procedure): Awake and Alert Pre-procedure Verification/Time Out Yes - 10:44 Taken: Start Time: 10:45 Pain Control: Other : Benzocaine T Area Debrided (L x W): otal 7.2 (cm) x 1.5 (cm) = 10.8 (cm) Tissue and other material debrided: Non-Viable, Slough, Subcutaneous, Slough Level: Skin/Subcutaneous Tissue Debridement Description: Excisional Instrument: Curette Bleeding: Minimum Hemostasis Achieved: Pressure End Time: 10:50 Response to Treatment: Procedure was tolerated well Level of Consciousness (Post- Awake and Alert procedure): Post Debridement Measurements of  Total Wound Length: (cm) 7.2 Width: (cm) 1.5 Depth: (cm) 0.2 Volume: (cm) 1.696 Character of Wound/Ulcer Post Debridement: Stable Post Procedure Diagnosis Same as Pre-procedure Electronic Signature(s) Signed: 11/28/2021 11:00:47 AM By: Stacey Shan DO Signed: 11/28/2021 1:08:15 PM By: Stacey [name] Entered By: Stacey [name] on 11/28/2021 10:50:26 -------------------------------------------------------------------------------- HPI Details Patient Name: Date of Service: Stacey [name], Stacey [name] RET M. 11/28/2021 10:15 A M Medical Record Number: [id number] Patient Account Number: [id number] Date of Birth/Sex: Treating RN: 08-21-1940 (81 y.o. Stacey [name] Primary Care Provider: Shon [name] Other Clinician: Referring Provider: Treating Provider/Extender: Stacey [name] in Treatment: 6 History of Present Illness HPI Description: Admission 10/16/2021 [name] is an 81 year old female with a past medical history of rheumatoid arthritis, interstitial lung disease that presents to the clinic for a 1 week history of wounds to her left lower extremity following a fall. She visited the ED following the trauma. She had 25 sutures placed to the left lower extremity wound. She also reports a hematoma to the left lateral thigh with an abrasion mark. She was started on doxycycline in the ED and she completed this. She currently denies signs of infection. 4/13; patient admitted the clinic last week after a fall. She had a fairly sizable laceration on the left lower leg that was sutured in the ER. Sutures are still in place. Also problematic she has a large hematoma over the left lateral hip. Apparently she did not have any x-rays done when she was in the ER of this area because it only really showed up 24 to 48 hours later. We have been using gentamicin and Hydrofera Blue to both areas. 4/20; patient presents for follow-up. She states that her orthopedic surgeon drained the  hematoma to her left lateral thigh. She has no open wounds to this area. She states she tolerated the compression wrap well up to the left lower extremity. She has no issues or complaints today. She denies signs of infection. 4/28; laceration on the left lower extremity following a fall. Since I last  saw this the sutures have been removed but the wound is dehisced and the surface is necrotic. We have been using gentamicin and Hydrofera Blue She tells me that the hematoma was seen by orthopedic surgery and they drained this using a large bore needle. We did not look at this today. Finally she is going to [facility] for relatives graduation next Friday we will bring her in early for a nurse visit to change the dressing she will be back in time to be seen the week after next 5/12; patient presents for follow-up. She has been using antibiotic ointment with Hydrofera Blue to the wound bed. She denies signs of infection. She has no issues or complaints today. 5/19; patient presents for follow-up. She tolerated the compression wrap well. We have been using gentamicin and Hydrofera Blue under Kerlix/Coban. She has no complaints today. Electronic Signature(s) Signed: 11/28/2021 11:00:47 AM By: Stacey Shan DO Entered By: Stacey [name] on 11/28/2021 10:57:44 -------------------------------------------------------------------------------- Physical Exam Details Patient Name: Date of Service: Stacey [name], [place] RET M. 11/28/2021 10:15 A M Medical Record Number: [id number] Patient Account Number: [id number] Date of Birth/Sex: Treating RN: [birth year] (81 y.o. Stacey [name] Primary Care Provider: Shon [name] Other Clinician: Referring Provider: Treating Provider/Extender: Stacey [name] in Treatment: 6 Constitutional respirations regular, non-labored and within target range for patient.. Cardiovascular 2+ dorsalis pedis/posterior tibialis pulses. Psychiatric pleasant and  cooperative. Notes Left lower extremity: T the anterior aspect there is an open wound with nonviable tissue and granulation tissue present. No surrounding signs of infection. o Electronic Signature(s) Signed: 11/28/2021 11:00:47 AM By: Stacey Shan DO Entered By: Stacey [name] on 11/28/2021 10:58:07 -------------------------------------------------------------------------------- Physician Orders Details Patient Name: Date of Service: Stacey [name], Stacey [name] RET M. 11/28/2021 10:15 A M Medical Record Number: [id number] Patient Account Number: [id number] Date of Birth/Sex: Treating RN: 01-13-1941 (81 y.o. Stacey [name] Primary Care Provider: Shon [name] Other Clinician: Referring Provider: Treating Provider/Extender: Stacey [name] in Treatment: 6 Verbal / Phone Orders: No Diagnosis Coding ICD-10 Coding Code Description [phone] Non-pressure chronic ulcer of other part of left lower leg with unspecified severity S80.812A Abrasion, left lower leg, initial encounter S70.02XD Contusion of left hip, subsequent encounter T79.8XXA Other early complications of trauma, initial encounter M06.9 Rheumatoid arthritis, unspecified Follow-up Appointments ppointment in 2 weeks. - on Thurs 12/04/21 @ 3:15pm with Dr. Heber Cameron and Allayne Butcher Return A Bathing/ Shower/ Hygiene May shower and wash wound with soap and water. Edema Control - Lymphedema / SCD / Other Elevate legs to the level of the heart or above for 30 minutes daily and/or when sitting, a frequency of: - elevate legs when sitting Avoid standing for long periods of time. Moisturize legs daily. - keep skin soft with moisturizer, do not get moisturizer on wound bed Additional Orders / Instructions Follow Nutritious Diet - increase protein intake Home Health No change in wound care orders this week; continue Home Health for wound care. May utilize formulary equivalent dressing for wound treatment orders unless otherwise  specified. Other Home Health Orders/Instructions: - [facility] Wound Treatment Wound #4 - Lower Leg Wound Laterality: Left, Anterior Cleanser: Soap and Water 1 x Per Day/15 Days Discharge Instructions: May shower and wash wound with dial antibacterial soap and water prior to dressing change. Cleanser: Wound Cleanser (Generic) 1 x Per Day/15 Days Discharge Instructions: Cleanse the wound with wound cleanser prior to applying a clean dressing using gauze sponges, not tissue or cotton balls. Topical: Gentamicin 1 x Per Day/15 Days Discharge  Instructions: apply to wound bed at dressing change Prim Dressing: Hydrofera Blue Ready Foam, 4x5 in (Generic) 1 x Per Day/15 Days ary Discharge Instructions: Apply to wound bed as instructed Secondary Dressing: Woven Gauze Sponge, Non-Sterile 4x4 in (Generic) 1 x Per Day/15 Days Discharge Instructions: Apply over primary dressing as directed. Secondary Dressing: Zetuvit Plus 4x8 in 1 x Per Day/15 Days Discharge Instructions: Apply over primary dressing as directed. Compression Wrap: Kerlix Roll 4.5x3.1 (in/yd) (Generic) 1 x Per Day/15 Days Discharge Instructions: Apply Kerlix and Coban compression as directed. Compression Wrap: Coban Self-Adherent Wrap 4x5 (in/yd) (Generic) 1 x Per Day/15 Days Discharge Instructions: Apply over Kerlix as directed. Electronic Signature(s) Signed: 11/28/2021 11:00:47 AM By: Stacey Shan DO Entered By: Stacey [name] on 11/28/2021 10:58:21 -------------------------------------------------------------------------------- Problem List Details Patient Name: Date of Service: Stacey [name], [place] RET M. 11/28/2021 10:15 A M Medical Record Number: [id number] Patient Account Number: [id number] Date of Birth/Sex: Treating RN: 06-11-1941 (81 y.o. Stacey [name] Primary Care Provider: Shon [name] Other Clinician: Referring Provider: Treating Provider/Extender: Stacey [name] in Treatment: 6 Active  Problems ICD-10 Encounter Code Description Active Date MDM Diagnosis L97.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 with unspecified 10/16/2021 No Yes severity S80.812A Abrasion, left lower leg, initial encounter 10/16/2021 No Yes S70.02XD Contusion of left hip, subsequent encounter 10/23/2021 No Yes T79.8XXA Other early complications of trauma, initial encounter 10/16/2021 No Yes M06.9 Rheumatoid arthritis, unspecified 10/16/2021 No Yes Inactive Problems Resolved Problems Electronic Signature(s) Signed: 11/28/2021 11:00:47 AM By: Stacey Shan DO Entered By: Stacey [name] on 11/28/2021 10:56:57 -------------------------------------------------------------------------------- Progress Note Details Patient Name: Date of Service: Stacey [name], Stacey [name] RET M. 11/28/2021 10:15 A M Medical Record Number: [id number] Patient Account Number: [id number] Date of Birth/Sex: Treating RN: 12-Feb-1941 (81 y.o. Stacey [name] Primary Care Provider: Shon [name] Other Clinician: Referring Provider: Treating Provider/Extender: Stacey [name] in Treatment: 6 Subjective Chief Complaint Information obtained from Patient 10/16/2021; left lower extremity wounds following trauma History of Present Illness (HPI) Admission 10/16/2021 [name] is an 81 year old female with a past medical history of rheumatoid arthritis, interstitial lung disease that presents to the clinic for a 1 week history of wounds to her left lower extremity following a fall. She visited the ED following the trauma. She had 25 sutures placed to the left lower extremity wound. She also reports a hematoma to the left lateral thigh with an abrasion mark. She was started on doxycycline in the ED and she completed this. She currently denies signs of infection. 4/13; patient admitted the clinic last week after a fall. She had a fairly sizable laceration on the left lower leg that was sutured in the ER.  Sutures are still in place. Also problematic she has a large hematoma over the left lateral hip. Apparently she did not have any x-rays done when she was in the ER of this area because it only really showed up 24 to 48 hours later. We have been using gentamicin and Hydrofera Blue to both areas. 4/20; patient presents for follow-up. She states that her orthopedic surgeon drained the hematoma to her left lateral thigh. She has no open wounds to this area. She states she tolerated the compression wrap well up to the left lower extremity. She has no issues or complaints today. She denies signs of infection. 4/28; laceration on the left lower extremity following a fall. Since I last saw this the sutures have been removed but the wound is dehisced  and the surface is necrotic. We have been using gentamicin and Hydrofera Blue She tells me that the hematoma was seen by orthopedic surgery and they drained this using a large bore needle. We did not look at this today. Finally she is going to [facility] for relatives graduation next Friday we will bring her in early for a nurse visit to change the dressing she will be back in time to be seen the week after next 5/12; patient presents for follow-up. She has been using antibiotic ointment with Hydrofera Blue to the wound bed. She denies signs of infection. She has no issues or complaints today. 5/19; patient presents for follow-up. She tolerated the compression wrap well. We have been using gentamicin and Hydrofera Blue under Kerlix/Coban. She has no complaints today. Patient History Information obtained from Patient. Family History Lung Disease - Father, No family history of Cancer, Diabetes, Heart Disease, Hypertension, Kidney Disease, Seizures, Stroke, Thyroid Problems, Tuberculosis. Social History Never smoker, Marital Status - Married, Alcohol Use - Never, Drug Use - No History, Caffeine Use - Never. Medical History Musculoskeletal Patient has history of  Rheumatoid Arthritis Medical A Surgical History Notes nd Eyes occasional double vision sometimes loses balance Respiratory interstitial lung disease Objective Constitutional respirations regular, non-labored and within target range for patient.. Vitals Time Taken: 10:28 AM, Height: 65 in, Weight: 133 lbs, BMI: 22.1, Temperature: 98 F, Pulse: 85 bpm, Respiratory Rate: 18 breaths/min, Blood Pressure: 123/63 mmHg. Cardiovascular 2+ dorsalis pedis/posterior tibialis pulses. Psychiatric pleasant and cooperative. General Notes: Left lower extremity: T the anterior aspect there is an open wound with nonviable tissue and granulation tissue present. No surrounding signs o of infection. Integumentary (Hair, Skin) Wound #4 status is Open. Original cause of wound was Trauma. The date acquired was: 10/08/2021. The wound has been in treatment 6 weeks. The wound is located on the Left,Anterior Lower Leg. The wound measures 7.2cm length x 1.5cm width x 0.2cm depth; 8.482cm^2 area and 1.696cm^3 volume. There is Fat Layer (Subcutaneous Tissue) exposed. There is no tunneling or undermining noted. There is a medium amount of serosanguineous drainage noted. The wound margin is distinct with the outline attached to the wound base. There is medium (34-66%) red, pink granulation within the wound bed. There is a medium (34-66%) amount of necrotic tissue within the wound bed including Adherent Slough. Assessment Active Problems ICD-10 Non-pressure chronic ulcer of other part of left lower leg with unspecified severity Abrasion, left lower leg, initial encounter Contusion of left hip, subsequent encounter Other early complications of trauma, initial encounter Rheumatoid arthritis, unspecified Patient's wound has shown improvement in appearance since last clinic visit. I debrided nonviable tissue. She has more epithelization and granulation tissue present. I recommended continuing the course with gentamicin  ointment and Hydrofera Blue under Kerlix/Coban. Follow-up in 1 week. Procedures Wound #4 Pre-procedure diagnosis of Wound #4 is a Trauma, Other located on the Left,Anterior Lower Leg . There was a Excisional Skin/Subcutaneous Tissue Debridement with a total area of 10.8 sq cm performed by Stacey Shan, DO. With the following instrument(s): Curette to remove Non-Viable tissue/material. Material removed includes Subcutaneous Tissue and Slough and after achieving pain control using Other (Benzocaine). No specimens were taken. A time out was conducted at 10:44, prior to the start of the procedure. A Minimum amount of bleeding was controlled with Pressure. The procedure was tolerated well. Post Debridement Measurements: 7.2cm length x 1.5cm width x 0.2cm depth; 1.696cm^3 volume. Character of Wound/Ulcer Post Debridement is stable. Post procedure Diagnosis Wound #4:  Same as Pre-Procedure Plan Follow-up Appointments: Return Appointment in 2 weeks. - on Thurs 12/04/21 @ 3:15pm with Dr. Heber  and Allayne Butcher Bathing/ Shower/ Hygiene: May shower and wash wound with soap and water. Edema Control - Lymphedema / SCD / Other: Elevate legs to the level of the heart or above for 30 minutes daily and/or when sitting, a frequency of: - elevate legs when sitting Avoid standing for long periods of time. Moisturize legs daily. - keep skin soft with moisturizer, do not get moisturizer on wound bed Additional Orders / Instructions: Follow Nutritious Diet - increase protein intake Home Health: No change in wound care orders this week; continue Home Health for wound care. May utilize formulary equivalent dressing for wound treatment orders unless otherwise specified. Other Home Health Orders/Instructions: - [facility] WOUND #4: - Lower Leg Wound Laterality: Left, Anterior Cleanser: Soap and Water 1 x Per Day/15 Days Discharge Instructions: May shower and wash wound with dial antibacterial soap and water prior to  dressing change. Cleanser: Wound Cleanser (Generic) 1 x Per Day/15 Days Discharge Instructions: Cleanse the wound with wound cleanser prior to applying a clean dressing using gauze sponges, not tissue or cotton balls. Topical: Gentamicin 1 x Per Day/15 Days Discharge Instructions: apply to wound bed at dressing change Prim Dressing: Hydrofera Blue Ready Foam, 4x5 in (Generic) 1 x Per Day/15 Days ary Discharge Instructions: Apply to wound bed as instructed Secondary Dressing: Woven Gauze Sponge, Non-Sterile 4x4 in (Generic) 1 x Per Day/15 Days Discharge Instructions: Apply over primary dressing as directed. Secondary Dressing: Zetuvit Plus 4x8 in 1 x Per Day/15 Days Discharge Instructions: Apply over primary dressing as directed. Com pression Wrap: Kerlix Roll 4.5x3.1 (in/yd) (Generic) 1 x Per Day/15 Days Discharge Instructions: Apply Kerlix and Coban compression as directed. Compression Wrap: Coban Self-Adherent Wrap 4x5 (in/yd) (Generic) 1 x Per Day/15 Days Discharge Instructions: Apply over Kerlix as directed. 1. In office sharp debridement 2. Gentamicin and Hydrofera Blue under Kerlix/Coban 3. Follow-up in 1 week Electronic Signature(s) Signed: 11/28/2021 11:00:47 AM By: Stacey Shan DO Entered By: Stacey [name] on 11/28/2021 10:59:03 -------------------------------------------------------------------------------- HxROS Details Patient Name: Date of Service: Stacey [name], Stacey [name] RET M. 11/28/2021 10:15 A M Medical Record Number: [id number] Patient Account Number: [id number] Date of Birth/Sex: Treating RN: Jun 21, 1941 (81 y.o. Stacey [name] Primary Care Provider: Shon [name] Other Clinician: Referring Provider: Treating Provider/Extender: Stacey [name] in Treatment: 6 Information Obtained From Patient Eyes Medical History: Past Medical History Notes: occasional double vision sometimes loses balance Respiratory Medical History: Past Medical History  Notes: interstitial lung disease Musculoskeletal Medical History: Positive for: Rheumatoid Arthritis Immunizations Pneumococcal Vaccine: Received Pneumococcal Vaccination: Yes Received Pneumococcal Vaccination On or After 60th Birthday: Yes Implantable Devices None Family and Social History Cancer: No; Diabetes: No; Heart Disease: No; Hypertension: No; Kidney Disease: No; Lung Disease: Yes - Father; Seizures: No; Stroke: No; Thyroid Problems: No; Tuberculosis: No; Never smoker; Marital Status - Married; Alcohol Use: Never; Drug Use: No History; Caffeine Use: Never; Financial Concerns: No; Food, Clothing or Shelter Needs: No; Support System Lacking: No; Transportation Concerns: No Electronic Signature(s) Signed: 11/28/2021 11:00:47 AM By: Stacey Shan DO Signed: 11/28/2021 1:08:15 PM By: Stacey [name] Entered By: Stacey [name] on 11/28/2021 10:57:49 -------------------------------------------------------------------------------- SuperBill Details Patient Name: Date of Service: [place], [place] RET M. 11/28/2021 Medical Record Number: [id number] Patient Account Number: [id number] Date of Birth/Sex: Treating RN: 04-Sep-1940 (81 y.o. Stacey [name] Primary Care Provider: Shon [name] Other Clinician: Referring Provider: Treating Provider/Extender: Stacey [name]  Stacey [name] Weeks in Treatment: 6 Diagnosis Coding ICD-10 Codes Code Description [phone] Non-pressure chronic ulcer of other part of left lower leg with unspecified severity S80.812A Abrasion, left lower leg, initial encounter S70.02XD Contusion of left hip, subsequent encounter T79.8XXA Other early complications of trauma, initial encounter M06.9 Rheumatoid arthritis, unspecified Facility Procedures CPT4 Code: [id number] Description: 43837 - DEB SUBQ TISSUE 20 SQ CM/< ICD-10 Diagnosis Description L97.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 with unspecified sev Modifier: erity Quantity:  1 Physician Procedures : CPT4 Code Description Modifier [id number] - WC PHYS SUBQ TISS 20 SQ CM ICD-10 Diagnosis Description L97.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 with unspecified severity Quantity: 1 Electronic Signature(s) Signed: 11/28/2021 11:00:47 AM By: Stacey Shan DO Entered By: Stacey [name] on 11/28/2021 10:59:12

## 2021-11-28 NOTE — Progress Notes (Signed)
Stacey [name], Stacey [name] ([id number]) Visit Report for 11/28/2021 Arrival Information Details Patient Name: Date of Service: Stacey [name] Stacey [name] RET M. 11/28/2021 10:15 A M Medical Record Number: [id number] Patient Account Number: [id number] Date of Birth/Sex: Treating RN: 18-Jan-1941 (81 y.o. [name] Primary Care Korde Jeppsen: Shon Baton Other Clinician: Referring Darnise Montag: Treating Kentley Cedillo/Extender: Cherre Huger in Treatment: 6 Visit Information History Since Last Visit Added or deleted any medications: No Patient Arrived: Stacey [name] Any new allergies or adverse reactions: No Arrival Time: 10:25 Had a fall or experienced change in No Transfer Assistance: None activities of daily living that may affect Patient Identification Verified: Yes risk of falls: Secondary Verification Process Completed: Yes Signs or symptoms of abuse/neglect since last visito No Patient Requires Transmission-Based Precautions: No Hospitalized since last visit: No Implantable device outside of the clinic excluding No cellular tissue based products placed in the center since last visit: Has Dressing in Place as Prescribed: Yes Pain Present Now: No Electronic Signature(s) Signed: 11/28/2021 1:08:15 PM By: Lorrin Jackson Entered By: Lorrin Jackson on 11/28/2021 10:28:30 -------------------------------------------------------------------------------- Encounter Discharge Information Details Patient Name: Date of Service: Stacey [name] RET M. 11/28/2021 10:15 A M Medical Record Number: [id number] Patient Account Number: [id number] Date of Birth/Sex: Treating RN: 01/16/1941 (81 y.o. [name] Primary Care Joas Motton: Shon Baton Other Clinician: Referring Mayrene Bastarache: Treating Crystian Frith/Extender: Cherre Huger in Treatment: 6 Encounter Discharge Information Items Post Procedure Vitals Discharge Condition: Stable Temperature (F): 98 Ambulatory Status: Cane Pulse (bpm):  85 Discharge Destination: Home Respiratory Rate (breaths/min): 18 Transportation: Private Auto Blood Pressure (mmHg): 123/63 Schedule Follow-up Appointment: Yes Clinical Summary of Care: Provided on 11/28/2021 Form Type Recipient Paper Patient Patient Electronic Signature(s) Signed: 11/28/2021 1:08:15 PM By: Lorrin Jackson Entered By: Lorrin Jackson on 11/28/2021 10:56:43 -------------------------------------------------------------------------------- Lower Extremity Assessment Details Patient Name: Date of Service: Stacey [name] RET M. 11/28/2021 10:15 A M Medical Record Number: [id number] Patient Account Number: [id number] Date of Birth/Sex: Treating RN: December 21, 1940 (81 y.o. [name] Primary Care Caeli Linehan: Shon Baton Other Clinician: Referring Mellina Benison: Treating Josceline Chenard/Extender: Cherre Huger in Treatment: 6 Edema Assessment Assessed: [name]: Yes] [name]: No] Edema: [Left: Ye] [Right: s] Calf Left: Right: Point of Measurement: 32 cm From Medial Instep 30 cm Ankle Left: Right: Point of Measurement: 11 cm From Medial Instep 19 cm Vascular Assessment Pulses: Dorsalis Pedis Palpable: [Left:Yes] Electronic Signature(s) Signed: 11/28/2021 1:08:15 PM By: Lorrin Jackson Entered By: Lorrin Jackson on 11/28/2021 10:29:04 -------------------------------------------------------------------------------- Multi Wound Chart Details Patient Name: Date of Service: Stacey [name] RET M. 11/28/2021 10:15 A M Medical Record Number: [id number] Patient Account Number: [id number] Date of Birth/Sex: Treating RN: 1940/09/13 (81 y.o. [name] Primary Care Ayanna Gheen: Shon Baton Other Clinician: Referring Yuriana Gaal: Treating Emya Picado/Extender: Cherre Huger in Treatment: 6 Vital Signs Height(in): 65 Pulse(bpm): 54 Weight(lbs): 133 Blood Pressure(mmHg): 123/63 Body Mass Index(BMI): 22.1 Temperature(F): 98 Respiratory  Rate(breaths/min): 18 Photos: [4:Left, Anterior Lower Leg] [N/A:N/A N/A] Wound Location: [4:Trauma] [N/A:N/A] Wounding Event: [4:Trauma, Other] [N/A:N/A] Primary Etiology: [4:Rheumatoid Arthritis] [N/A:N/A] Comorbid History: [4:10/08/2021] [N/A:N/A] Date Acquired: [4:6] [N/A:N/A] Weeks of Treatment: [4:Open] [N/A:N/A] Wound Status: [4:No] [N/A:N/A] Wound Recurrence: [4:7.2x1.5x0.2] [N/A:N/A] Measurements L x W x D (cm) [4:8.482] [N/A:N/A] A (cm) : rea [4:1.696] [N/A:N/A] Volume (cm) : [4:79.10%] [N/A:N/A] % Reduction in A rea: [4:79.10%] [N/A:N/A] % Reduction in Volume: [4:Full Thickness Without Exposed] [N/A:N/A] Classification: [4:Support Structures Medium] [N/A:N/A] Exudate A mount: [4:Serosanguineous] [N/A:N/A] Exudate Type: [4:red, brown] [N/A:N/A] Exudate Color: [4:Distinct, outline  attached] [N/A:N/A] Wound Margin: [4:Medium (34-66%)] [N/A:N/A] Granulation A mount: [4:Red, Pink] [N/A:N/A] Granulation Quality: [4:Medium (34-66%)] [N/A:N/A] Necrotic A mount: [4:Fat Layer (Subcutaneous Tissue): Yes N/A] Exposed Structures: [4:Fascia: No Tendon: No Muscle: No Joint: No Bone: No Medium (34-66%)] [N/A:N/A] Epithelialization: [4:Debridement - Excisional] [N/A:N/A] Debridement: Pre-procedure Verification/Time Out 10:44 [N/A:N/A] Taken: [4:Other] [N/A:N/A] Pain Control: [4:Subcutaneous, Slough] [N/A:N/A] Tissue Debrided: [4:Skin/Subcutaneous Tissue] [N/A:N/A] Level: [4:10.8] [N/A:N/A] Debridement A (sq cm): [4:rea Curette] [N/A:N/A] Instrument: [4:Minimum] [N/A:N/A] Bleeding: [4:Pressure] [N/A:N/A] Hemostasis A chieved: [4:Procedure was tolerated well] [N/A:N/A] Debridement Treatment Response: [4:7.2x1.5x0.2] [N/A:N/A] Post Debridement Measurements L x W x D (cm) [4:1.696] [N/A:N/A] Post Debridement Volume: (cm) [4:Debridement] [N/A:N/A] Treatment Notes Wound #4 (Lower Leg) Wound Laterality: Left, Anterior Cleanser Soap and Water Discharge Instruction: May shower and  wash wound with dial antibacterial soap and water prior to dressing change. Wound Cleanser Discharge Instruction: Cleanse the wound with wound cleanser prior to applying a clean dressing using gauze sponges, not tissue or cotton balls. Peri-Wound Care Topical Gentamicin Discharge Instruction: apply to wound bed at dressing change Primary Dressing Hydrofera Blue Ready Foam, 4x5 in Discharge Instruction: Apply to wound bed as instructed Secondary Dressing Woven Gauze Sponge, Non-Sterile 4x4 in Discharge Instruction: Apply over primary dressing as directed. Zetuvit Plus 4x8 in Discharge Instruction: Apply over primary dressing as directed. Secured With Compression Wrap Kerlix Roll 4.5x3.1 (in/yd) Discharge Instruction: Apply Kerlix and Coban compression as directed. Coban Self-Adherent Wrap 4x5 (in/yd) Discharge Instruction: Apply over Kerlix as directed. Compression Stockings Add-Ons Electronic Signature(s) Signed: 11/28/2021 11:00:47 AM By: Kalman Shan DO Signed: 11/28/2021 1:08:15 PM By: Lorrin Jackson Entered By: Kalman Shan on 11/28/2021 10:57:03 -------------------------------------------------------------------------------- Multi-Disciplinary Care Plan Details Patient Name: Date of Service: [place], [place] RET M. 11/28/2021 10:15 A M Medical Record Number: [id number] Patient Account Number: [id number] Date of Birth/Sex: Treating RN: 1941-01-29 (81 y.o. [name] Primary Care Norelle Runnion: Shon Baton Other Clinician: Referring Anastasia Tompson: Treating Aurorah Schlachter/Extender: Cherre Huger in Treatment: 6 Active Inactive Wound/Skin Impairment Nursing Diagnoses: Impaired tissue integrity Goals: Ulcer/skin breakdown will have a volume reduction of 30% by week 4 Date Initiated: 10/16/2021 Date Inactivated: 11/21/2021 Target Resolution Date: 11/13/2021 Goal Status: Met Ulcer/skin breakdown will have a volume reduction of 50% by week 8 Date Initiated:  11/21/2021 Target Resolution Date: 12/11/2021 Goal Status: Active Interventions: Assess patient/caregiver ability to obtain necessary supplies Provide education on ulcer and skin care Notes: Electronic Signature(s) Signed: 11/28/2021 1:08:15 PM By: Lorrin Jackson Entered By: Lorrin Jackson on 11/28/2021 10:24:45 -------------------------------------------------------------------------------- Pain Assessment Details Patient Name: Date of Service: Moses Manners RET M. 11/28/2021 10:15 A M Medical Record Number: [id number] Patient Account Number: [id number] Date of Birth/Sex: Treating RN: 09-23-1940 (81 y.o. [name] Primary Care Beryl Balz: Shon Baton Other Clinician: Referring Philis Doke: Treating Garron Eline/Extender: Cherre Huger in Treatment: 6 Active Problems Location of Pain Severity and Description of Pain Patient Has Paino No Patient Has Paino No Site Locations Pain Management and Medication Current Pain Management: Electronic Signature(s) Signed: 11/28/2021 1:08:15 PM By: Lorrin Jackson Entered By: Lorrin Jackson on 11/28/2021 10:28:53 -------------------------------------------------------------------------------- Patient/Caregiver Education Details Patient Name: Date of Service: Moses Manners RET M. 5/19/2023andnbsp10:15 A M Medical Record Number: [id number] Patient Account Number: [id number] Date of Birth/Gender: Treating RN: 1940/10/20 (81 y.o. [name] Primary Care Physician: Shon Baton Other Clinician: Referring Physician: Treating Physician/Extender: Cherre Huger in Treatment: 6 Education Assessment Education Provided To: Patient Education Topics Provided Wound/Skin Impairment: Methods: Explain/Verbal, Printed Responses: State content correctly  Electronic Signature(s) Signed: 11/28/2021 1:08:15 PM By: Lorrin Jackson Entered By: Lorrin Jackson on 11/28/2021  10:24:59 -------------------------------------------------------------------------------- Wound Assessment Details Patient Name: Date of Service: [place], [name] RET M. 11/28/2021 10:15 A M Medical Record Number: [id number] Patient Account Number: [id number] Date of Birth/Sex: Treating RN: 04-12-1941 (81 y.o. [name] Primary Care Cassidy Tashiro: Shon Baton Other Clinician: Referring Rayshon Albaugh: Treating Matej Sappenfield/Extender: Cherre Huger in Treatment: 6 Wound Status Wound Number: 4 Primary Etiology: Trauma, Other Wound Location: Left, Anterior Lower Leg Wound Status: Open Wounding Event: Trauma Comorbid History: Rheumatoid Arthritis Date Acquired: 10/08/2021 Weeks Of Treatment: 6 Clustered Wound: No Photos Wound Measurements Length: (cm) 7.2 Width: (cm) 1.5 Depth: (cm) 0.2 Area: (cm) 8.482 Volume: (cm) 1.696 % Reduction in Area: 79.1% % Reduction in Volume: 79.1% Epithelialization: Medium (34-66%) Tunneling: No Undermining: No Wound Description Classification: Full Thickness Without Exposed Support Structures Wound Margin: Distinct, outline attached Exudate Amount: Medium Exudate Type: Serosanguineous Exudate Color: red, brown Foul Odor After Cleansing: No Slough/Fibrino Yes Wound Bed Granulation Amount: Medium (34-66%) Exposed Structure Granulation Quality: Red, Pink Fascia Exposed: No Necrotic Amount: Medium (34-66%) Fat Layer (Subcutaneous Tissue) Exposed: Yes Necrotic Quality: Adherent Slough Tendon Exposed: No Muscle Exposed: No Joint Exposed: No Bone Exposed: No Treatment Notes Wound #4 (Lower Leg) Wound Laterality: Left, Anterior Cleanser Soap and Water Discharge Instruction: May shower and wash wound with dial antibacterial soap and water prior to dressing change. Wound Cleanser Discharge Instruction: Cleanse the wound with wound cleanser prior to applying a clean dressing using gauze sponges, not tissue or cotton  balls. Peri-Wound Care Topical Gentamicin Discharge Instruction: apply to wound bed at dressing change Primary Dressing Hydrofera Blue Ready Foam, 4x5 in Discharge Instruction: Apply to wound bed as instructed Secondary Dressing Woven Gauze Sponge, Non-Sterile 4x4 in Discharge Instruction: Apply over primary dressing as directed. Zetuvit Plus 4x8 in Discharge Instruction: Apply over primary dressing as directed. Secured With Compression Wrap Kerlix Roll 4.5x3.1 (in/yd) Discharge Instruction: Apply Kerlix and Coban compression as directed. Coban Self-Adherent Wrap 4x5 (in/yd) Discharge Instruction: Apply over Kerlix as directed. Compression Stockings Add-Ons Electronic Signature(s) Signed: 11/28/2021 1:08:15 PM By: Lorrin Jackson Entered By: Lorrin Jackson on 11/28/2021 10:37:57 -------------------------------------------------------------------------------- Vitals Details Patient Name: Date of Service: Stacey Dy, [place] RET M. 11/28/2021 10:15 A M Medical Record Number: [id number] Patient Account Number: [id number] Date of Birth/Sex: Treating RN: [birth year] (81 y.o. [name] Primary Care Andilyn Bettcher: Shon Baton Other Clinician: Referring Sylvestre Rathgeber: Treating Shakeria Robinette/Extender: Cherre Huger in Treatment: 6 Vital Signs Time Taken: 10:28 Temperature (F): 98 Height (in): 65 Pulse (bpm): 85 Weight (lbs): 133 Respiratory Rate (breaths/min): 18 Body Mass Index (BMI): 22.1 Blood Pressure (mmHg): 123/63 Reference Range: 80 - 120 mg / dl Electronic Signature(s) Signed: 11/28/2021 1:08:15 PM By: Lorrin Jackson Entered By: Lorrin Jackson on 11/28/2021 10:28:47

## 2021-12-04 ENCOUNTER — Encounter (HOSPITAL_BASED_OUTPATIENT_CLINIC_OR_DEPARTMENT_OTHER): Payer: Medicare Other | Admitting: Internal Medicine

## 2021-12-04 DIAGNOSIS — S80812A Abrasion, left lower leg, initial encounter: Secondary | ICD-10-CM | POA: Diagnosis not present

## 2021-12-04 DIAGNOSIS — S7002XA Contusion of left hip, initial encounter: Secondary | ICD-10-CM | POA: Diagnosis not present

## 2021-12-04 DIAGNOSIS — M80012D Age-related osteoporosis with current pathological fracture, left shoulder, subsequent encounter for fracture with routine healing: Secondary | ICD-10-CM | POA: Diagnosis not present

## 2021-12-04 DIAGNOSIS — S81812D Laceration without foreign body, left lower leg, subsequent encounter: Secondary | ICD-10-CM | POA: Diagnosis not present

## 2021-12-04 DIAGNOSIS — J849 Interstitial pulmonary disease, unspecified: Secondary | ICD-10-CM | POA: Diagnosis not present

## 2021-12-04 DIAGNOSIS — R296 Repeated falls: Secondary | ICD-10-CM | POA: Diagnosis not present

## 2021-12-04 DIAGNOSIS — T798XXA Other early complications of trauma, initial encounter: Secondary | ICD-10-CM | POA: Diagnosis not present

## 2021-12-04 DIAGNOSIS — M069 Rheumatoid arthritis, unspecified: Secondary | ICD-10-CM | POA: Diagnosis not present

## 2021-12-04 DIAGNOSIS — S71102D Unspecified open wound, left thigh, subsequent encounter: Secondary | ICD-10-CM | POA: Diagnosis not present

## 2021-12-04 DIAGNOSIS — L97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 with unspecified severity: Secondary | ICD-10-CM | POA: Diagnosis not present

## 2021-12-04 DIAGNOSIS — M0510 Rheumatoid lung disease with rheumatoid arthritis of unspecified site: Secondary | ICD-10-CM | POA: Diagnosis not present

## 2021-12-05 DIAGNOSIS — M0510 Rheumatoid lung disease with rheumatoid arthritis of unspecified site: Secondary | ICD-10-CM | POA: Diagnosis not present

## 2021-12-05 DIAGNOSIS — J849 Interstitial pulmonary disease, unspecified: Secondary | ICD-10-CM | POA: Diagnosis not present

## 2021-12-05 DIAGNOSIS — S71102D Unspecified open wound, left thigh, subsequent encounter: Secondary | ICD-10-CM | POA: Diagnosis not present

## 2021-12-05 DIAGNOSIS — S81812D Laceration without foreign body, left lower leg, subsequent encounter: Secondary | ICD-10-CM | POA: Diagnosis not present

## 2021-12-05 DIAGNOSIS — M80012D Age-related osteoporosis with current pathological fracture, left shoulder, subsequent encounter for fracture with routine healing: Secondary | ICD-10-CM | POA: Diagnosis not present

## 2021-12-05 DIAGNOSIS — R296 Repeated falls: Secondary | ICD-10-CM | POA: Diagnosis not present

## 2021-12-08 DIAGNOSIS — M80012D Age-related osteoporosis with current pathological fracture, left shoulder, subsequent encounter for fracture with routine healing: Secondary | ICD-10-CM | POA: Diagnosis not present

## 2021-12-08 DIAGNOSIS — M0510 Rheumatoid lung disease with rheumatoid arthritis of unspecified site: Secondary | ICD-10-CM | POA: Diagnosis not present

## 2021-12-08 DIAGNOSIS — R296 Repeated falls: Secondary | ICD-10-CM | POA: Diagnosis not present

## 2021-12-08 DIAGNOSIS — S81812D Laceration without foreign body, left lower leg, subsequent encounter: Secondary | ICD-10-CM | POA: Diagnosis not present

## 2021-12-08 DIAGNOSIS — J849 Interstitial pulmonary disease, unspecified: Secondary | ICD-10-CM | POA: Diagnosis not present

## 2021-12-08 DIAGNOSIS — S71102D Unspecified open wound, left thigh, subsequent encounter: Secondary | ICD-10-CM | POA: Diagnosis not present

## 2021-12-09 DIAGNOSIS — I8312 Varicose veins of left lower extremity with inflammation: Secondary | ICD-10-CM | POA: Diagnosis not present

## 2021-12-09 DIAGNOSIS — I8311 Varicose veins of right lower extremity with inflammation: Secondary | ICD-10-CM | POA: Diagnosis not present

## 2021-12-09 DIAGNOSIS — Z85828 Personal history of other malignant neoplasm of skin: Secondary | ICD-10-CM | POA: Diagnosis not present

## 2021-12-09 DIAGNOSIS — L603 Nail dystrophy: Secondary | ICD-10-CM | POA: Diagnosis not present

## 2021-12-09 DIAGNOSIS — L821 Other seborrheic keratosis: Secondary | ICD-10-CM | POA: Diagnosis not present

## 2021-12-09 DIAGNOSIS — D225 Melanocytic nevi of trunk: Secondary | ICD-10-CM | POA: Diagnosis not present

## 2021-12-09 DIAGNOSIS — I872 Venous insufficiency (chronic) (peripheral): Secondary | ICD-10-CM | POA: Diagnosis not present

## 2021-12-10 DIAGNOSIS — S81812D Laceration without foreign body, left lower leg, subsequent encounter: Secondary | ICD-10-CM | POA: Diagnosis not present

## 2021-12-10 DIAGNOSIS — R296 Repeated falls: Secondary | ICD-10-CM | POA: Diagnosis not present

## 2021-12-10 DIAGNOSIS — S71102D Unspecified open wound, left thigh, subsequent encounter: Secondary | ICD-10-CM | POA: Diagnosis not present

## 2021-12-10 DIAGNOSIS — M80012D Age-related osteoporosis with current pathological fracture, left shoulder, subsequent encounter for fracture with routine healing: Secondary | ICD-10-CM | POA: Diagnosis not present

## 2021-12-10 DIAGNOSIS — M0510 Rheumatoid lung disease with rheumatoid arthritis of unspecified site: Secondary | ICD-10-CM | POA: Diagnosis not present

## 2021-12-10 DIAGNOSIS — J849 Interstitial pulmonary disease, unspecified: Secondary | ICD-10-CM | POA: Diagnosis not present

## 2021-12-10 NOTE — Progress Notes (Signed)
SKI, POLICH ([id number]) Visit Report for 12/04/2021 Chief Complaint Document Details Patient Name: Date of Service: Stacey [name], [place] [facility] RET M. 12/04/2021 3:15 PM Medical Record Number: [id number] Patient Account Number: [id number] Date of Birth/Sex: Treating RN: [birth year] (81 y.o. Stacey [name], [name] Primary Care Provider: Shon Baton Other Clinician: Referring Provider: Treating Provider/Extender: Cherre Huger in Treatment: 7 Information Obtained from: Patient Chief Complaint 10/16/2021; left lower extremity wounds following trauma Electronic Signature(s) Signed: 12/04/2021 4:05:15 PM By: Kalman Shan DO Entered By: Kalman Shan on 12/04/2021 16:02:51 -------------------------------------------------------------------------------- Debridement Details Patient Name: Date of Service: [name], [name]. 12/04/2021 3:15 PM Medical Record Number: [id number] Patient Account Number: [id number] Date of Birth/Sex: Treating RN: 23-Aug-1940 (81 y.o. Stacey [name], [name] Primary Care Provider: Shon Baton Other Clinician: Referring Provider: Treating Provider/Extender: Cherre Huger in Treatment: 7 Debridement Performed for Assessment: Wound #4 Left,Anterior Lower Leg Performed By: Physician Kalman Shan, DO Debridement Type: Debridement Level of Consciousness (Pre-procedure): Awake and Alert Pre-procedure Verification/Time Out Yes - 15:30 Taken: Start Time: 15:30 Pain Control: Lidocaine T Area Debrided (L x W): otal 7 (cm) x 1.2 (cm) = 8.4 (cm) Tissue and other material debrided: Viable, Non-Viable, Slough, Subcutaneous, Slough Level: Skin/Subcutaneous Tissue Debridement Description: Excisional Instrument: Curette Bleeding: Minimum Hemostasis Achieved: Pressure End Time: 15:30 Procedural Pain: 0 Post Procedural Pain: 0 Response to Treatment: Procedure was tolerated well Level of Consciousness (Post- Awake and  Alert procedure): Post Debridement Measurements of Total Wound Length: (cm) 7 Width: (cm) 1.2 Depth: (cm) 0.2 Volume: (cm) 1.319 Character of Wound/Ulcer Post Debridement: Improved Post Procedure Diagnosis Same as Pre-procedure Electronic Signature(s) Signed: 12/04/2021 3:50:23 PM By: Rhae Hammock RN Signed: 12/04/2021 4:05:15 PM By: Kalman Shan DO Entered By: Rhae Hammock on 12/04/2021 15:42:24 -------------------------------------------------------------------------------- HPI Details Patient Name: Date of Service: [name], [name]. 12/04/2021 3:15 PM Medical Record Number: [id number] Patient Account Number: [id number] Date of Birth/Sex: Treating RN: 1941-04-09 (81 y.o. Stacey [name], [name] Primary Care Provider: Shon Baton Other Clinician: Referring Provider: Treating Provider/Extender: Cherre Huger in Treatment: 7 History of Present Illness HPI Description: Admission 10/16/2021 [name] is an 81 year old female with a past medical history of rheumatoid arthritis, interstitial lung disease that presents to the clinic for a 1 week history of wounds to her left lower extremity following a fall. She visited the ED following the trauma. She had 25 sutures placed to the left lower extremity wound. She also reports a hematoma to the left lateral thigh with an abrasion mark. She was started on doxycycline in the ED and she completed this. She currently denies signs of infection. 4/13; patient admitted the clinic last week after a fall. She had a fairly sizable laceration on the left lower leg that was sutured in the ER. Sutures are still in place. Also problematic she has a large hematoma over the left lateral hip. Apparently she did not have any x-rays done when she was in the ER of this area because it only really showed up 24 to 48 hours later. We have been using gentamicin and Hydrofera Blue to both areas. 4/20; patient presents for  follow-up. She states that her orthopedic surgeon drained the hematoma to her left lateral thigh. She has no open wounds to this area. She states she tolerated the compression wrap well up to the left lower extremity. She has no issues or complaints today. She denies signs of infection. 4/28; laceration on the left lower extremity following a  fall. Since I last saw this the sutures have been removed but the wound is dehisced and the surface is necrotic. We have been using gentamicin and Hydrofera Blue She tells me that the hematoma was seen by orthopedic surgery and they drained this using a large bore needle. We did not look at this today. Finally she is going to [facility] for relatives graduation next Friday we will bring her in early for a nurse visit to change the dressing she will be back in time to be seen the week after next 5/12; patient presents for follow-up. She has been using antibiotic ointment with Hydrofera Blue to the wound bed. She denies signs of infection. She has no issues or complaints today. 5/19; patient presents for follow-up. She tolerated the compression wrap well. We have been using gentamicin and Hydrofera Blue under Kerlix/Coban. She has no complaints today. 5/25; patient presents for follow-up. We will continue to use gentamicin and Hydrofera Blue under Kerlix/Coban. She has no issues or complaints today. She denies signs of infection. Electronic Signature(s) Signed: 12/04/2021 4:05:15 PM By: Kalman Shan DO Entered By: Kalman Shan on 12/04/2021 16:03:13 -------------------------------------------------------------------------------- Physical Exam Details Patient Name: Date of Service: Stacey [name] RET M. 12/04/2021 3:15 PM Medical Record Number: [id number] Patient Account Number: [id number] Date of Birth/Sex: Treating RN: 07-Apr-1941 (81 y.o. Stacey [name], [name] Primary Care Provider: Shon Baton Other Clinician: Referring Provider: Treating  Provider/Extender: Cherre Huger in Treatment: 7 Constitutional respirations regular, non-labored and within target range for patient.. Cardiovascular 2+ dorsalis pedis/posterior tibialis pulses. Psychiatric pleasant and cooperative. Notes Left lower extremity: T the anterior aspect there is an open wound with nonviable tissue and granulation tissue present. No surrounding signs of infection. o Electronic Signature(s) Signed: 12/04/2021 4:05:15 PM By: Kalman Shan DO Entered By: Kalman Shan on 12/04/2021 16:03:36 -------------------------------------------------------------------------------- Physician Orders Details Patient Name: Date of Service: [place], [place] RET M. 12/04/2021 3:15 PM Medical Record Number: [id number] Patient Account Number: [id number] Date of Birth/Sex: Treating RN: 1941/07/06 (81 y.o. Stacey [name], [name] Primary Care Provider: Shon Baton Other Clinician: Referring Provider: Treating Provider/Extender: Cherre Huger in Treatment: 7 Verbal / Phone Orders: No Diagnosis Coding Follow-up Appointments ppointment in 1 week. - Dr. Heber LaBelle and Allayne Butcher Room # 9 Return A Bathing/ Shower/ Hygiene May shower and wash wound with soap and water. Edema Control - Lymphedema / SCD / Other Elevate legs to the level of the heart or above for 30 minutes daily and/or when sitting, a frequency of: - elevate legs when sitting Avoid standing for long periods of time. Moisturize legs daily. - keep skin soft with moisturizer, do not get moisturizer on wound bed Additional Orders / Instructions Follow Nutritious Diet - increase protein intake Home Health No change in wound care orders this week; continue Home Health for wound care. May utilize formulary equivalent dressing for wound treatment orders unless otherwise specified. Other Home Health Orders/Instructions: - [facility] Wound Treatment Wound #4 - Lower Leg Wound  Laterality: Left, Anterior Cleanser: Soap and Water 1 x Per Day/15 Days Discharge Instructions: May shower and wash wound with dial antibacterial soap and water prior to dressing change. Cleanser: Wound Cleanser (Generic) 1 x Per Day/15 Days Discharge Instructions: Cleanse the wound with wound cleanser prior to applying a clean dressing using gauze sponges, not tissue or cotton balls. Topical: Gentamicin 1 x Per Day/15 Days Discharge Instructions: apply to wound bed at dressing change Prim Dressing: Hydrofera Blue Ready Foam, 4x5 in (Generic)  1 x Per Day/15 Days ary Discharge Instructions: Apply to wound bed as instructed Secondary Dressing: Woven Gauze Sponge, Non-Sterile 4x4 in (Generic) 1 x Per Day/15 Days Discharge Instructions: Apply over primary dressing as directed. Compression Wrap: Kerlix Roll 4.5x3.1 (in/yd) (Generic) 1 x Per Day/15 Days Discharge Instructions: Apply Kerlix and Coban compression as directed. Compression Wrap: Coban Self-Adherent Wrap 4x5 (in/yd) (Generic) 1 x Per Day/15 Days Discharge Instructions: Apply over Kerlix as directed. Electronic Signature(s) Signed: 12/04/2021 4:05:15 PM By: Kalman Shan DO Previous Signature: 12/04/2021 3:50:23 PM Version By: Rhae Hammock RN Entered By: Kalman Shan on 12/04/2021 16:03:51 -------------------------------------------------------------------------------- Problem List Details Patient Name: Date of Service: [place], [place] RET M. 12/04/2021 3:15 PM Medical Record Number: [id number] Patient Account Number: [id number] Date of Birth/Sex: Treating RN: [birth year] (81 y.o. 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 10/16/2021  No Yes severity S80.812A Abrasion, left lower leg, initial encounter 10/16/2021 No Yes S70.02XD Contusion of left hip, subsequent encounter 10/23/2021 No Yes T79.8XXA Other early complications of trauma, initial encounter 10/16/2021 No Yes M06.9 Rheumatoid arthritis, unspecified 10/16/2021 No Yes Inactive Problems Resolved Problems Electronic Signature(s) Signed: 12/04/2021 4:05:15 PM By: Kalman Shan DO Entered By: Kalman Shan on 12/04/2021 16:02:37 -------------------------------------------------------------------------------- Progress Note Details Patient Name: Date of Service: [place], [name]. 12/04/2021 3:15 PM Medical Record Number: [id number] Patient Account Number: [id number] Date of Birth/Sex: Treating RN: Nov 09, 1940 (81 y.o. Stacey [name], [name] Primary Care Provider: Shon Baton Other Clinician: Referring Provider: Treating Provider/Extender: Cherre Huger in Treatment: 7 Subjective Chief Complaint Information obtained from Patient 10/16/2021; left lower extremity wounds following trauma History of Present Illness (HPI) Admission 10/16/2021 Ms. Tyreona Panjwani is an 81 year old female with a past medical history of rheumatoid arthritis, interstitial lung disease that presents to the clinic for a 1 week history of wounds to her left lower extremity following a fall. She visited the ED following the trauma. She had 25 sutures placed to the left lower extremity wound. She also reports a hematoma to the left lateral thigh with an abrasion mark. She was started on doxycycline in the ED and she completed this. She currently denies signs of infection. 4/13; patient admitted the clinic last week after a fall. She had a fairly sizable laceration on the left lower leg that was sutured in the ER. Sutures are still in place. Also problematic she has a large hematoma over the left lateral hip. Apparently she did not have any x-rays done when she was in the ER of  this area because it only really showed up 24 to 48 hours later. We have been using gentamicin and Hydrofera Blue to both areas. 4/20; patient presents for follow-up. She states that her orthopedic surgeon drained the hematoma to her left lateral thigh. She has no open wounds to this area. She states she tolerated the compression wrap well up to the left lower extremity. She has no issues or complaints today. She denies signs of infection. 4/28; laceration on the left lower extremity following a fall. Since I last saw this the sutures have been removed but the wound is dehisced and the surface is necrotic. We have been using gentamicin and Hydrofera Blue She tells me that the hematoma was seen by orthopedic surgery and they drained this  using a large bore needle. We did not look at this today. Finally she is going to [facility] for relatives graduation next Friday we will bring her in early for a nurse visit to change the dressing she will be back in time to be seen the week after next 5/12; patient presents for follow-up. She has been using antibiotic ointment with Hydrofera Blue to the wound bed. She denies signs of infection. She has no issues or complaints today. 5/19; patient presents for follow-up. She tolerated the compression wrap well. We have been using gentamicin and Hydrofera Blue under Kerlix/Coban. She has no complaints today. 5/25; patient presents for follow-up. We will continue to use gentamicin and Hydrofera Blue under Kerlix/Coban. She has no issues or complaints today. She denies signs of infection. Patient History Information obtained from Patient. Family History Lung Disease - Father, No family history of Cancer, Diabetes, Heart Disease, Hypertension, Kidney Disease, Seizures, Stroke, Thyroid Problems, Tuberculosis. Social History Never smoker, Marital Status - Married, Alcohol Use - Never, Drug Use - No History, Caffeine Use - Never. Medical History Musculoskeletal Patient  has history of Rheumatoid Arthritis Medical A Surgical History Notes nd Eyes occasional double vision sometimes loses balance Respiratory interstitial lung disease Objective Constitutional respirations regular, non-labored and within target range for patient.. Vitals Time Taken: 3:15 PM, Height: 65 in, Weight: 133 lbs, BMI: 22.1, Temperature: 97.7 F, Pulse: 74 bpm, Respiratory Rate: 17 breaths/min, Blood Pressure: 153/74 mmHg. Cardiovascular 2+ dorsalis pedis/posterior tibialis pulses. Psychiatric pleasant and cooperative. General Notes: Left lower extremity: T the anterior aspect there is an open wound with nonviable tissue and granulation tissue present. No surrounding signs o of infection. Integumentary (Hair, Skin) Wound #4 status is Open. Original cause of wound was Trauma. The date acquired was: 10/08/2021. The wound has been in treatment 7 weeks. The wound is located on the Left,Anterior Lower Leg. The wound measures 7cm length x 1.2cm width x 0.2cm depth; 6.597cm^2 area and 1.319cm^3 volume. There is Fat Layer (Subcutaneous Tissue) exposed. There is a medium amount of serosanguineous drainage noted. The wound margin is distinct with the outline attached to the wound base. There is medium (34-66%) red, pink granulation within the wound bed. There is a medium (34-66%) amount of necrotic tissue within the wound bed including Adherent Slough. Assessment Active Problems ICD-10 Non-pressure chronic ulcer of other part of left lower leg with unspecified severity Abrasion, left lower leg, initial encounter Contusion of left hip, subsequent encounter Other early complications of trauma, initial encounter Rheumatoid arthritis, unspecified Patient's wound has shown improvement in size and appearance since last clinic visit. I debrided nonviable tissue. I recommended continuing the course with antibiotic ointment and Hydrofera Blue under compression therapy. Follow-up in 1  week. Procedures Wound #4 Pre-procedure diagnosis of Wound #4 is a Trauma, Other located on the Left,Anterior Lower Leg . There was a Excisional Skin/Subcutaneous Tissue Debridement with a total area of 8.4 sq cm performed by Kalman Shan, DO. With the following instrument(s): Curette to remove Viable and Non-Viable tissue/material. Material removed includes Subcutaneous Tissue and Slough and after achieving pain control using Lidocaine. No specimens were taken. A time out was conducted at 15:30, prior to the start of the procedure. A Minimum amount of bleeding was controlled with Pressure. The procedure was tolerated well with a pain level of 0 throughout and a pain level of 0 following the procedure. Post Debridement Measurements: 7cm length x 1.2cm width x 0.2cm depth; 1.319cm^3 volume. Character of Wound/Ulcer Post Debridement is improved.  Post procedure Diagnosis Wound #4: Same as Pre-Procedure Plan Follow-up Appointments: Return Appointment in 1 week. - Dr. Heber [place] and Allayne Butcher Room # 9 Bathing/ Shower/ Hygiene: May shower and wash wound with soap and water. Edema Control - Lymphedema / SCD / Other: Elevate legs to the level of the heart or above for 30 minutes daily and/or when sitting, a frequency of: - elevate legs when sitting Avoid standing for long periods of time. Moisturize legs daily. - keep skin soft with moisturizer, do not get moisturizer on wound bed Additional Orders / Instructions: Follow Nutritious Diet - increase protein intake Home Health: No change in wound care orders this week; continue Home Health for wound care. May utilize formulary equivalent dressing for wound treatment orders unless otherwise specified. Other Home Health Orders/Instructions: - [facility] WOUND #4: - Lower Leg Wound Laterality: Left, Anterior Cleanser: Soap and Water 1 x Per Day/15 Days Discharge Instructions: May shower and wash wound with dial antibacterial soap and water prior to  dressing change. Cleanser: Wound Cleanser (Generic) 1 x Per Day/15 Days Discharge Instructions: Cleanse the wound with wound cleanser prior to applying a clean dressing using gauze sponges, not tissue or cotton balls. Topical: Gentamicin 1 x Per Day/15 Days Discharge Instructions: apply to wound bed at dressing change Prim Dressing: Hydrofera Blue Ready Foam, 4x5 in (Generic) 1 x Per Day/15 Days ary Discharge Instructions: Apply to wound bed as instructed Secondary Dressing: Woven Gauze Sponge, Non-Sterile 4x4 in (Generic) 1 x Per Day/15 Days Discharge Instructions: Apply over primary dressing as directed. Com pression Wrap: Kerlix Roll 4.5x3.1 (in/yd) (Generic) 1 x Per Day/15 Days Discharge Instructions: Apply Kerlix and Coban compression as directed. Com pression Wrap: Coban Self-Adherent Wrap 4x5 (in/yd) (Generic) 1 x Per Day/15 Days Discharge Instructions: Apply over Kerlix as directed. 1. In office sharp debridement 2. Gentamicin with Hydrofera Blue under Kerlix/Coban 3. Follow-up in 1 week Electronic Signature(s) Signed: 12/04/2021 4:05:15 PM By: Kalman Shan DO Entered By: Kalman Shan on 12/04/2021 16:04:44 -------------------------------------------------------------------------------- HxROS Details Patient Name: Date of Service: [place], [name]. 12/04/2021 3:15 PM Medical Record Number: [id number] Patient Account Number: [id number] Date of Birth/Sex: Treating RN: 04-23-1941 (81 y.o. Stacey [name], [name] Primary Care Provider: Shon Baton Other Clinician: Referring Provider: Treating Provider/Extender: Cherre Huger in Treatment: 7 Information Obtained From Patient Eyes Medical History: Past Medical History Notes: occasional double vision sometimes loses balance Respiratory Medical History: Past Medical History Notes: interstitial lung disease Musculoskeletal Medical History: Positive for: Rheumatoid  Arthritis Immunizations Pneumococcal Vaccine: Received Pneumococcal Vaccination: Yes Received Pneumococcal Vaccination On or After 60th Birthday: Yes Implantable Devices None Family and Social History Cancer: No; Diabetes: No; Heart Disease: No; Hypertension: No; Kidney Disease: No; Lung Disease: Yes - Father; Seizures: No; Stroke: No; Thyroid Problems: No; Tuberculosis: No; Never smoker; Marital Status - Married; Alcohol Use: Never; Drug Use: No History; Caffeine Use: Never; Financial Concerns: No; Food, Clothing or Shelter Needs: No; Support System Lacking: No; Transportation Concerns: No Electronic Signature(s) Signed: 12/04/2021 4:05:15 PM By: Kalman Shan DO Signed: 12/10/2021 3:17:18 PM By: Rhae Hammock RN Entered By: Kalman Shan on 12/04/2021 16:03:18 -------------------------------------------------------------------------------- SuperBill Details Patient Name: Date of Service: [name], [place] RET M. 12/04/2021 Medical Record Number: [id number] Patient Account Number: [id number] Date of Birth/Sex: Treating RN: 11/15/1940 (81 y.o. Stacey [name], [name] Primary Care Provider: Shon Baton Other Clinician: Referring Provider: Treating Provider/Extender: Cherre Huger in Treatment: 7 Diagnosis Coding ICD-10 Codes Code Description [phone] Non-pressure chronic ulcer  of other part of left lower leg with unspecified severity S80.812A Abrasion, left lower leg, initial encounter S70.02XD Contusion of left hip, subsequent encounter T79.8XXA Other early complications of trauma, initial encounter M06.9 Rheumatoid arthritis, unspecified Facility Procedures CPT4 Code: [id number] Description: 46568 - DEB SUBQ TISSUE 20 SQ CM/< ICD-10 Diagnosis Description 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

## 2021-12-11 DIAGNOSIS — F419 Anxiety disorder, unspecified: Secondary | ICD-10-CM | POA: Diagnosis not present

## 2021-12-11 DIAGNOSIS — I872 Venous insufficiency (chronic) (peripheral): Secondary | ICD-10-CM | POA: Diagnosis not present

## 2021-12-11 DIAGNOSIS — I700 Atherosclerosis of aorta: Secondary | ICD-10-CM | POA: Diagnosis not present

## 2021-12-11 DIAGNOSIS — E785 Hyperlipidemia, unspecified: Secondary | ICD-10-CM | POA: Diagnosis not present

## 2021-12-11 DIAGNOSIS — S81812D Laceration without foreign body, left lower leg, subsequent encounter: Secondary | ICD-10-CM | POA: Diagnosis not present

## 2021-12-11 DIAGNOSIS — I771 Stricture of artery: Secondary | ICD-10-CM | POA: Diagnosis not present

## 2021-12-11 DIAGNOSIS — J9621 Acute and chronic respiratory failure with hypoxia: Secondary | ICD-10-CM | POA: Diagnosis not present

## 2021-12-11 DIAGNOSIS — N393 Stress incontinence (female) (male): Secondary | ICD-10-CM | POA: Diagnosis not present

## 2021-12-11 DIAGNOSIS — M80012D Age-related osteoporosis with current pathological fracture, left shoulder, subsequent encounter for fracture with routine healing: Secondary | ICD-10-CM | POA: Diagnosis not present

## 2021-12-11 DIAGNOSIS — K5900 Constipation, unspecified: Secondary | ICD-10-CM | POA: Diagnosis not present

## 2021-12-11 DIAGNOSIS — D692 Other nonthrombocytopenic purpura: Secondary | ICD-10-CM | POA: Diagnosis not present

## 2021-12-11 DIAGNOSIS — R42 Dizziness and giddiness: Secondary | ICD-10-CM | POA: Diagnosis not present

## 2021-12-11 DIAGNOSIS — J449 Chronic obstructive pulmonary disease, unspecified: Secondary | ICD-10-CM | POA: Diagnosis not present

## 2021-12-11 DIAGNOSIS — K219 Gastro-esophageal reflux disease without esophagitis: Secondary | ICD-10-CM | POA: Diagnosis not present

## 2021-12-11 DIAGNOSIS — B0223 Postherpetic polyneuropathy: Secondary | ICD-10-CM | POA: Diagnosis not present

## 2021-12-11 DIAGNOSIS — I10 Essential (primary) hypertension: Secondary | ICD-10-CM | POA: Diagnosis not present

## 2021-12-11 DIAGNOSIS — J849 Interstitial pulmonary disease, unspecified: Secondary | ICD-10-CM | POA: Diagnosis not present

## 2021-12-11 DIAGNOSIS — M810 Age-related osteoporosis without current pathological fracture: Secondary | ICD-10-CM | POA: Diagnosis not present

## 2021-12-11 DIAGNOSIS — J4540 Moderate persistent asthma, uncomplicated: Secondary | ICD-10-CM | POA: Diagnosis not present

## 2021-12-11 DIAGNOSIS — M0510 Rheumatoid lung disease with rheumatoid arthritis of unspecified site: Secondary | ICD-10-CM | POA: Diagnosis not present

## 2021-12-11 DIAGNOSIS — H532 Diplopia: Secondary | ICD-10-CM | POA: Diagnosis not present

## 2021-12-11 DIAGNOSIS — R296 Repeated falls: Secondary | ICD-10-CM | POA: Diagnosis not present

## 2021-12-11 DIAGNOSIS — Z8616 Personal history of COVID-19: Secondary | ICD-10-CM | POA: Diagnosis not present

## 2021-12-12 ENCOUNTER — Encounter (HOSPITAL_BASED_OUTPATIENT_CLINIC_OR_DEPARTMENT_OTHER): Payer: Medicare Other | Attending: General Surgery | Admitting: Internal Medicine

## 2021-12-12 DIAGNOSIS — S7002XA Contusion of left hip, initial encounter: Secondary | ICD-10-CM | POA: Insufficient documentation

## 2021-12-12 DIAGNOSIS — L97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 with unspecified severity: Secondary | ICD-10-CM | POA: Insufficient documentation

## 2021-12-12 DIAGNOSIS — S80812A Abrasion, left lower leg, initial encounter: Secondary | ICD-10-CM | POA: Insufficient documentation

## 2021-12-12 DIAGNOSIS — M069 Rheumatoid arthritis, unspecified: Secondary | ICD-10-CM | POA: Insufficient documentation

## 2021-12-12 DIAGNOSIS — T798XXA Other early complications of trauma, initial encounter: Secondary | ICD-10-CM | POA: Insufficient documentation

## 2021-12-12 DIAGNOSIS — J849 Interstitial pulmonary disease, unspecified: Secondary | ICD-10-CM | POA: Diagnosis not present

## 2021-12-12 DIAGNOSIS — W19XXXA Unspecified fall, initial encounter: Secondary | ICD-10-CM | POA: Insufficient documentation

## 2021-12-12 NOTE — Progress Notes (Addendum)
MARRI, MCNEFF ([id number]) Visit Report for 12/12/2021 Arrival Information Details Patient Name: Date of Service: [place], [name] 12/12/2021 10:45 A M Medical Record Number: [id number] Patient Account Number: [id number] Date of Birth/Sex: Treating RN: [birth year] (81 y.o. [name], [name] Primary Care Virgilia Quigg: Shon Baton Other Clinician: Referring Doniqua Saxby: Treating Adaline Trejos/Extender: Cherre Huger in Treatment: 8 Visit Information History Since Last Visit Added or deleted any medications: No Patient Arrived: Ambulatory Any new allergies or adverse reactions: No Arrival Time: 10:48 Had a fall or experienced change in No Accompanied By: self activities of daily living that may affect Transfer Assistance: None risk of falls: Patient Identification Verified: Yes Signs or symptoms of abuse/neglect since last visito No Secondary Verification Process Completed: Yes Hospitalized since last visit: No Patient Requires Transmission-Based Precautions: No Implantable device outside of the clinic excluding No cellular tissue based products placed in the center since last visit: Has Dressing in Place as Prescribed: Yes Has Compression in Place as Prescribed: Yes Pain Present Now: No Electronic Signature(s) Signed: 12/12/2021 11:55:08 AM By: Rhae Hammock RN Entered By: Rhae Hammock on 12/12/2021 10:48:50 -------------------------------------------------------------------------------- Encounter Discharge Information Details Patient Name: Date of Service: [place], [name]. 12/12/2021 10:45 A M Medical Record Number: [id number] Patient Account Number: [id number] Date of Birth/Sex: Treating RN: 1940/11/03 (81 y.o. [name], [name] Primary Care Mose Colaizzi: Shon Baton Other Clinician: Referring Sinjin Amero: Treating Vonetta Foulk/Extender: Cherre Huger in Treatment: 8 Encounter Discharge Information Items Post Procedure  Vitals Discharge Condition: Stable Temperature (F): 98.7 Ambulatory Status: Ambulatory Pulse (bpm): 74 Discharge Destination: Home Respiratory Rate (breaths/min): 17 Transportation: Private Auto Blood Pressure (mmHg): 134/74 Accompanied By: self Schedule Follow-up Appointment: Yes Clinical Summary of Care: Patient Declined Electronic Signature(s) Signed: 12/12/2021 11:55:08 AM By: Rhae Hammock RN Entered By: Rhae Hammock on 12/12/2021 11:21:55 -------------------------------------------------------------------------------- Lower Extremity Assessment Details Patient Name: Date of Service: [place], [name]. 12/12/2021 10:45 A M Medical Record Number: [id number] Patient Account Number: [id number] Date of Birth/Sex: Treating RN: 06/24/1941 (81 y.o. [name], [name] Primary Care Rhylen Pulido: Shon Baton Other Clinician: Referring Lilton Pare: Treating Asyia Hornung/Extender: Cherre Huger in Treatment: 8 Edema Assessment Assessed: [name]: Yes] [name]: No] Edema: [Left: Ye] [Right: s] Calf Left: Right: Point of Measurement: 32 cm From Medial Instep 30 cm Ankle Left: Right: Point of Measurement: 11 cm From Medial Instep 19 cm Vascular Assessment Pulses: Dorsalis Pedis Palpable: [Left:Yes] Posterior Tibial Palpable: [Left:Yes] Electronic Signature(s) Signed: 12/12/2021 11:55:08 AM By: Rhae Hammock RN Entered By: Rhae Hammock on 12/12/2021 10:51:04 -------------------------------------------------------------------------------- Multi Wound Chart Details Patient Name: Date of Service: [name], [name]. 12/12/2021 10:45 A M Medical Record Number: [id number] Patient Account Number: [id number] Date of Birth/Sex: Treating RN: Jun 07, 1941 (81 y.o. [name], [name] Primary Care Johann Santone: Shon Baton Other Clinician: Referring Kabe Mckoy: Treating Madhuri Vacca/Extender: Cherre Huger in Treatment: 8 Vital Signs Height(in):  65 Pulse(bpm): 3 Weight(lbs): 133 Blood Pressure(mmHg): 122/74 Body Mass Index(BMI): 22.1 Temperature(F): 98 Respiratory Rate(breaths/min): 17 Photos: [4:Left, Anterior Lower Leg] [N/A:N/A N/A] Wound Location: [4:Trauma] [N/A:N/A] Wounding Event: [4:Trauma, Other] [N/A:N/A] Primary Etiology: [4:Rheumatoid Arthritis] [N/A:N/A] Comorbid History: [4:10/08/2021] [N/A:N/A] Date Acquired: [4:8] [N/A:N/A] Weeks of Treatment: [4:Open] [N/A:N/A] Wound Status: [4:No] [N/A:N/A] Wound Recurrence: [4:2.3x0.9x0.2] [N/A:N/A] Measurements L x W x D (cm) [4:1.626] [N/A:N/A] A (cm) : rea [4:0.325] [N/A:N/A] Volume (cm) : [4:96.00%] [N/A:N/A] % Reduction in A rea: [4:96.00%] [N/A:N/A] % Reduction in Volume: [4:Full Thickness Without Exposed] [N/A:N/A] Classification: [4:Support Structures Medium] [N/A:N/A] Exudate  A mount: [4:Serosanguineous] [N/A:N/A] Exudate Type: [4:red, brown] [N/A:N/A] Exudate Color: [4:Distinct, outline attached] [N/A:N/A] Wound Margin: [4:Medium (34-66%)] [N/A:N/A] Granulation A mount: [4:Red, Pink] [N/A:N/A] Granulation Quality: [4:Medium (34-66%)] [N/A:N/A] Necrotic A mount: [4:Fat Layer (Subcutaneous Tissue): Yes N/A] Exposed Structures: [4:Fascia: No Tendon: No Muscle: No Joint: No Bone: No Medium (34-66%)] [N/A:N/A] Epithelialization: [4:Debridement - Excisional] [N/A:N/A] Debridement: Pre-procedure Verification/Time Out 11:00 [N/A:N/A] Taken: [4:Lidocaine] [N/A:N/A] Pain Control: [4:Subcutaneous, Slough] [N/A:N/A] Tissue Debrided: [4:Skin/Subcutaneous Tissue] [N/A:N/A] Level: [4:2.07] [N/A:N/A] Debridement A (sq cm): [4:rea Curette] [N/A:N/A] Instrument: [4:Minimum] [N/A:N/A] Bleeding: [4:Pressure] [N/A:N/A] Hemostasis A chieved: [4:0] [N/A:N/A] Procedural Pain: [4:0] [N/A:N/A] Post Procedural Pain: [4:Procedure was tolerated well] [N/A:N/A] Debridement Treatment Response: [4:2.3x0.9x0.2] [N/A:N/A] Post Debridement Measurements L x W x D (cm) [4:0.325]  [N/A:N/A] Post Debridement Volume: (cm) [4:Debridement] [N/A:N/A] Treatment Notes Wound #4 (Lower Leg) Wound Laterality: Left, Anterior Cleanser Soap and Water Discharge Instruction: May shower and wash wound with dial antibacterial soap and water prior to dressing change. Wound Cleanser Discharge Instruction: Cleanse the wound with wound cleanser prior to applying a clean dressing using gauze sponges, not tissue or cotton balls. Peri-Wound Care Topical Gentamicin Discharge Instruction: apply to wound bed at dressing change Primary Dressing Hydrofera Blue Ready Foam, 4x5 in Discharge Instruction: Apply to wound bed as instructed Secondary Dressing Woven Gauze Sponge, Non-Sterile 4x4 in Discharge Instruction: Apply over primary dressing as directed. Secured With Compression Wrap Kerlix Roll 4.5x3.1 (in/yd) Discharge Instruction: Apply Kerlix and Coban compression as directed. Coban Self-Adherent Wrap 4x5 (in/yd) Discharge Instruction: Apply over Kerlix as directed. Compression Stockings Add-Ons Electronic Signature(s) Signed: 12/13/2021 11:47:12 PM By: Kalman Shan DO Signed: 12/15/2021 4:10:20 PM By: Rhae Hammock RN Entered By: Kalman Shan on 12/13/2021 23:47:12 -------------------------------------------------------------------------------- Multi-Disciplinary Care Plan Details Patient Name: Date of Service: [place], [place] RET M. 12/12/2021 10:45 A M Medical Record Number: [id number] Patient Account Number: [id number] Date of Birth/Sex: Treating RN: 02/04/1941 (81 y.o. [name], [name] Primary Care Kendrell Lottman: Shon Baton Other Clinician: Referring Cyerra Yim: Treating Arnold Depinto/Extender: Cherre Huger in Treatment: 8 Active Inactive Wound/Skin Impairment Nursing Diagnoses: Impaired tissue integrity Goals: Ulcer/skin breakdown will have a volume reduction of 30% by week 4 Date Initiated: 10/16/2021 Date Inactivated: 11/21/2021 Target  Resolution Date: 11/13/2021 Goal Status: Met Ulcer/skin breakdown will have a volume reduction of 50% by week 8 Date Initiated: 11/21/2021 Target Resolution Date: 01/10/2022 Goal Status: Active Interventions: Assess patient/caregiver ability to obtain necessary supplies Provide education on ulcer and skin care Notes: Electronic Signature(s) Signed: 12/12/2021 11:55:08 AM By: Rhae Hammock RN Entered By: Rhae Hammock on 12/12/2021 11:09:52 -------------------------------------------------------------------------------- Pain Assessment Details Patient Name: Date of Service: [place] RET M. 12/12/2021 10:45 A M Medical Record Number: [id number] Patient Account Number: [id number] Date of Birth/Sex: Treating RN: 1941/06/21 (81 y.o. [name], [name] Primary Care Robbin Escher: Shon Baton Other Clinician: Referring Taunia Frasco: Treating Rissie Sculley/Extender: Cherre Huger in Treatment: 8 Active Problems Location of Pain Severity and Description of Pain Patient Has Paino No Site Locations Pain Management and Medication Current Pain Management: Electronic Signature(s) Signed: 12/12/2021 11:55:08 AM By: Rhae Hammock RN Entered By: Rhae Hammock on 12/12/2021 10:50:55 -------------------------------------------------------------------------------- Patient/Caregiver Education Details Patient Name: Date of Service: [place] Mann[place] RET M. 6/2/2023andnbsp10:45 A M Medical Record Number: [id number] Patient Account Number: [id number] Date of Birth/Gender: Treating RN: 02-02-1941 (81 y.o. [name], [name] Primary Care Physician: Shon Baton Other Clinician: Referring Physician: Treating Physician/Extender: Cherre Huger in Treatment: 8 Education Assessment Education Provided To: Patient Education Topics Provided  Wound/Skin Impairment: Methods: Explain/Verbal Responses: Reinforcements needed, State content correctly Electronic  Signature(s) Signed: 12/12/2021 11:55:08 AM By: Rhae Hammock RN Entered By: Rhae Hammock on 12/12/2021 11:19:56 -------------------------------------------------------------------------------- Wound Assessment Details Patient Name: Date of Service: [name], [place] RET M. 12/12/2021 10:45 A M Medical Record Number: [id number] Patient Account Number: [id number] Date of Birth/Sex: Treating RN: Aug 23, 1940 (81 y.o. [name], [name] Primary Care Avalynne Diver: Shon Baton Other Clinician: Referring Yulia Ulrich: Treating Cereniti Curb/Extender: Cherre Huger in Treatment: 8 Wound Status Wound Number: 4 Primary Etiology: Trauma, Other Wound Location: Left, Anterior Lower Leg Wound Status: Open Wounding Event: Trauma Comorbid History: Rheumatoid Arthritis Date Acquired: 10/08/2021 Weeks Of Treatment: 8 Clustered Wound: No Photos Wound Measurements Length: (cm) 2.3 Width: (cm) 0.9 Depth: (cm) 0.2 Area: (cm) 1.626 Volume: (cm) 0.325 % Reduction in Area: 96% % Reduction in Volume: 96% Epithelialization: Medium (34-66%) Tunneling: No Undermining: No Wound Description Classification: Full Thickness Without Exposed Support Structures Wound Margin: Distinct, outline attached Exudate Amount: Medium Exudate Type: Serosanguineous Exudate Color: red, brown Foul Odor After Cleansing: No Slough/Fibrino Yes Wound Bed Granulation Amount: Medium (34-66%) Exposed Structure Granulation Quality: Red, Pink Fascia Exposed: No Necrotic Amount: Medium (34-66%) Fat Layer (Subcutaneous Tissue) Exposed: Yes Necrotic Quality: Adherent Slough Tendon Exposed: No Muscle Exposed: No Joint Exposed: No Bone Exposed: No Treatment Notes Wound #4 (Lower Leg) Wound Laterality: Left, Anterior Cleanser Soap and Water Discharge Instruction: May shower and wash wound with dial antibacterial soap and water prior to dressing change. Wound Cleanser Discharge Instruction: Cleanse the wound  with wound cleanser prior to applying a clean dressing using gauze sponges, not tissue or cotton balls. Peri-Wound Care Topical Gentamicin Discharge Instruction: apply to wound bed at dressing change Primary Dressing Hydrofera Blue Ready Foam, 4x5 in Discharge Instruction: Apply to wound bed as instructed Secondary Dressing Woven Gauze Sponge, Non-Sterile 4x4 in Discharge Instruction: Apply over primary dressing as directed. Secured With Compression Wrap Kerlix Roll 4.5x3.1 (in/yd) Discharge Instruction: Apply Kerlix and Coban compression as directed. Coban Self-Adherent Wrap 4x5 (in/yd) Discharge Instruction: Apply over Kerlix as directed. Compression Stockings Add-Ons Electronic Signature(s) Signed: 12/12/2021 11:55:08 AM By: Rhae Hammock RN Entered By: Rhae Hammock on 12/12/2021 10:58:03 -------------------------------------------------------------------------------- Vitals Details Patient Name: Date of Service: [name], [place] RET M. 12/12/2021 10:45 A M Medical Record Number: [id number] Patient Account Number: [id number] Date of Birth/Sex: Treating RN: [birth year] (81 y.o. [name], [name] Primary Care Secret Kristensen: Shon Baton Other Clinician: Referring Sharrod Achille: Treating Ahjanae Cassel/Extender: Cherre Huger in Treatment: 8 Vital Signs Time Taken: 10:49 Temperature (F): 98 Height (in): 65 Pulse (bpm): 74 Weight (lbs): 133 Respiratory Rate (breaths/min): 17 Body Mass Index (BMI): 22.1 Blood Pressure (mmHg): 122/74 Reference Range: 80 - 120 mg / dl Electronic Signature(s) Signed: 12/12/2021 11:55:08 AM By: Rhae Hammock RN Entered By: Rhae Hammock on 12/12/2021 10:50:43

## 2021-12-15 NOTE — Progress Notes (Signed)
Stacey [name], RENA ([id number]) Visit Report for 12/12/2021 Chief Complaint Document Details Patient Name: Date of Service: [place], [place] [facility] RET M. 12/12/2021 10:45 A M Medical Record Number: [id number] Patient Account Number: [id number] Date of Birth/Sex: Treating RN: May 12, 1941 (81 y.o. [name], [name] Primary Care Provider: Shon Baton Other Clinician: Referring Provider: Treating Provider/Extender: Cherre Huger in Treatment: 8 Information Obtained from: Patient Chief Complaint 10/16/2021; left lower extremity wounds following trauma Electronic Signature(s) Signed: 12/13/2021 11:47:30 PM By: Kalman Shan DO Entered By: Kalman Shan on 12/13/2021 23:47:29 -------------------------------------------------------------------------------- Debridement Details Patient Name: Date of Service: Stacey [name], Stacey [name] Donovan RET M. 12/12/2021 10:45 A M Medical Record Number: [id number] Patient Account Number: [id number] Date of Birth/Sex: Treating RN: May 02, 1941 (81 y.o. [name], [name] Primary Care Provider: Shon Baton Other Clinician: Referring Provider: Treating Provider/Extender: Cherre Huger in Treatment: 8 Debridement Performed for Assessment: Wound #4 Left,Anterior Lower Leg Performed By: Physician Kalman Shan, DO Debridement Type: Debridement Level of Consciousness (Pre-procedure): Awake and Alert Pre-procedure Verification/Time Out Yes - 11:00 Taken: Start Time: 11:00 Pain Control: Lidocaine T Area Debrided (L x W): otal 2.3 (cm) x 0.9 (cm) = 2.07 (cm) Tissue and other material debrided: Viable, Non-Viable, Slough, Subcutaneous, Slough Level: Skin/Subcutaneous Tissue Debridement Description: Excisional Instrument: Curette Bleeding: Minimum Hemostasis Achieved: Pressure End Time: 11:00 Procedural Pain: 0 Post Procedural Pain: 0 Response to Treatment: Procedure was tolerated well Level of Consciousness (Post- Awake and  Alert procedure): Post Debridement Measurements of Total Wound Length: (cm) 2.3 Width: (cm) 0.9 Depth: (cm) 0.2 Volume: (cm) 0.325 Character of Wound/Ulcer Post Debridement: Improved Post Procedure Diagnosis Same as Pre-procedure Electronic Signature(s) Signed: 12/12/2021 11:55:08 AM By: Rhae Hammock RN Signed: 12/14/2021 12:16:43 AM By: Kalman Shan DO Entered By: Rhae Hammock on 12/12/2021 11:02:17 -------------------------------------------------------------------------------- HPI Details Patient Name: Date of Service: Stacey [name], Stacey [name] Donovan RET M. 12/12/2021 10:45 A M Medical Record Number: [id number] Patient Account Number: [id number] Date of Birth/Sex: Treating RN: 1940-10-11 (81 y.o. [name], [name] Primary Care Provider: Shon Baton Other Clinician: Referring Provider: Treating Provider/Extender: Cherre Huger in Treatment: 8 History of Present Illness HPI Description: Admission 10/16/2021 Ms. Dessire Grimes is an 81 year old female with a past medical history of rheumatoid arthritis, interstitial lung disease that presents to the clinic for a 1 week history of wounds to her left lower extremity following a fall. She visited the ED following the trauma. She had 25 sutures placed to the left lower extremity wound. She also reports a hematoma to the left lateral thigh with an abrasion mark. She was started on doxycycline in the ED and she completed this. She currently denies signs of infection. 4/13; patient admitted the clinic last week after a fall. She had a fairly sizable laceration on the left lower leg that was sutured in the ER. Sutures are still in place. Also problematic she has a large hematoma over the left lateral hip. Apparently she did not have any x-rays done when she was in the ER of this area because it only really showed up 24 to 48 hours later. We have been using gentamicin and Hydrofera Blue to both areas. 4/20; patient presents for  follow-up. She states that her orthopedic surgeon drained the hematoma to her left lateral thigh. She has no open wounds to this area. She states she tolerated the compression wrap well up to the left lower extremity. She has no issues or complaints today. She denies signs of infection. 4/28; laceration on the left lower  extremity following a fall. Since I last saw this the sutures have been removed but the wound is dehisced and the surface is necrotic. We have been using gentamicin and Hydrofera Blue She tells me that the hematoma was seen by orthopedic surgery and they drained this using a large bore needle. We did not look at this today. Finally she is going to [facility] for relatives graduation next Friday we will bring her in early for a nurse visit to change the dressing she will be back in time to be seen the week after next 5/12; patient presents for follow-up. She has been using antibiotic ointment with Hydrofera Blue to the wound bed. She denies signs of infection. She has no issues or complaints today. 5/19; patient presents for follow-up. She tolerated the compression wrap well. We have been using gentamicin and Hydrofera Blue under Kerlix/Coban. She has no complaints today. 5/25; patient presents for follow-up. We will continue to use gentamicin and Hydrofera Blue under Kerlix/Coban. She has no issues or complaints today. She denies signs of infection. 6/2; Patient presents for follow up. Patient has been using hydrofera blue under kerlix/coban. She reports improvement in wound healing. She denies signs of infection. Electronic Signature(s) Signed: 12/13/2021 11:49:51 PM By: Kalman Shan DO Entered By: Kalman Shan on 12/13/2021 23:49:51 -------------------------------------------------------------------------------- Physical Exam Details Patient Name: Date of Service: [place], [place] RET M. 12/12/2021 10:45 A M Medical Record Number: [id number] Patient Account Number:  [id number] Date of Birth/Sex: Treating RN: 16-Nov-1940 (81 y.o. [name], [name] Primary Care Provider: Shon Baton Other Clinician: Referring Provider: Treating Provider/Extender: Cherre Huger in Treatment: 8 Constitutional respirations regular, non-labored and within target range for patient.. Cardiovascular 2+ dorsalis pedis/posterior tibialis pulses. Psychiatric pleasant and cooperative. Notes Left lower extremity: T the anterior aspect there is an open wound with granulation tissue and non viable tissue. No surrounding signs of infection. o Electronic Signature(s) Signed: 12/13/2021 11:50:57 PM By: Kalman Shan DO Entered By: Kalman Shan on 12/13/2021 23:50:57 -------------------------------------------------------------------------------- Physician Orders Details Patient Name: Date of Service: [place], [place] RET M. 12/12/2021 10:45 A M Medical Record Number: [id number] Patient Account Number: [id number] Date of Birth/Sex: Treating RN: 18-Oct-1940 (81 y.o. [name], [name] Primary Care Provider: Shon Baton Other Clinician: Referring Provider: Treating Provider/Extender: Cherre Huger in Treatment: 8 Verbal / Phone Orders: No Diagnosis Coding Follow-up Appointments ppointment in 1 week. - on Thursday just for next week w/ Dr. Heber [place] Dellia Nims covering) and [facility] Room # 9 12/18/21 @ 1430 Return A Bathing/ Shower/ Hygiene May shower and wash wound with soap and water. Edema Control - Lymphedema / SCD / Other Elevate legs to the level of the heart or above for 30 minutes daily and/or when sitting, a frequency of: - elevate legs when sitting Avoid standing for long periods of time. Moisturize legs daily. - keep skin soft with moisturizer, do not get moisturizer on wound bed Additional Orders / Instructions Follow Nutritious Diet - increase protein intake Home Health No change in wound care orders this week; continue Home  Health for wound care. May utilize formulary equivalent dressing for wound treatment orders unless otherwise specified. Other Home Health Orders/Instructions: - [facility] Wound Treatment Wound #4 - Lower Leg Wound Laterality: Left, Anterior Cleanser: Soap and Water 1 x Per Day/15 Days Discharge Instructions: May shower and wash wound with dial antibacterial soap and water prior to dressing change. Cleanser: Wound Cleanser (Generic) 1 x Per Day/15 Days Discharge Instructions: Cleanse the wound  with wound cleanser prior to applying a clean dressing using gauze sponges, not tissue or cotton balls. Topical: Gentamicin 1 x Per Day/15 Days Discharge Instructions: apply to wound bed at dressing change Prim Dressing: Hydrofera Blue Ready Foam, 4x5 in (Generic) 1 x Per Day/15 Days ary Discharge Instructions: Apply to wound bed as instructed Secondary Dressing: Woven Gauze Sponge, Non-Sterile 4x4 in (Generic) 1 x Per Day/15 Days Discharge Instructions: Apply over primary dressing as directed. Compression Wrap: Kerlix Roll 4.5x3.1 (in/yd) (Generic) 1 x Per Day/15 Days Discharge Instructions: Apply Kerlix and Coban compression as directed. Compression Wrap: Coban Self-Adherent Wrap 4x5 (in/yd) (Generic) 1 x Per Day/15 Days Discharge Instructions: Apply over Kerlix as directed. Electronic Signature(s) Signed: 12/14/2021 12:16:43 AM By: Kalman Shan DO Previous Signature: 12/12/2021 11:55:08 AM Version By: Rhae Hammock RN Entered By: Kalman Shan on 12/13/2021 23:51:08 -------------------------------------------------------------------------------- Problem List Details Patient Name: Date of Service: [place], [place] RET M. 12/12/2021 10:45 A M Medical Record Number: [id number] Patient Account Number: [id number] Date of Birth/Sex: Treating RN: 1941/01/28 (81 y.o. 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 with unspecified 10/16/2021 No Yes severity S80.812A Abrasion, left lower leg, initial encounter 10/16/2021 No Yes S70.02XD Contusion of left hip, subsequent encounter 10/23/2021 No Yes T79.8XXA Other early complications of trauma, initial encounter 10/16/2021 No Yes M06.9 Rheumatoid arthritis, unspecified 10/16/2021 No Yes Inactive Problems Resolved Problems Electronic Signature(s) Signed: 12/13/2021 11:47:04 PM By: Kalman Shan DO Entered By: Kalman Shan on 12/13/2021 23:47:04 -------------------------------------------------------------------------------- Progress Note Details Patient Name: Date of Service: [place], Stacey [name] Donovan RET M. 12/12/2021 10:45 A M Medical Record Number: [id number] Patient Account Number: [id number] Date of Birth/Sex: Treating RN: 29-Jul-1940 (81 y.o. [name], [name] Primary Care Provider: Other Clinician: Shon Baton Referring Provider: Treating Provider/Extender: Cherre Huger in Treatment: 8 Subjective Chief Complaint Information obtained from Patient 10/16/2021; left lower extremity wounds following trauma History of Present Illness (HPI) Admission 10/16/2021 Ms. Ciin Brazzel is an 81 year old female with a past medical history of rheumatoid arthritis, interstitial lung disease that presents to the clinic for a 1 week history of wounds to her left lower extremity following a fall. She visited the ED following the trauma. She had 25 sutures placed to the left lower extremity wound. She also reports a hematoma to the left lateral thigh with an abrasion mark. She was started on doxycycline in the ED and she completed this. She currently denies signs of infection. 4/13; patient admitted the clinic last week after a fall. She had a fairly sizable  laceration on the left lower leg that was sutured in the ER. Sutures are still in place. Also problematic she has a large hematoma over the left lateral hip. Apparently she did not have any x-rays done when she was in the ER of this area because it only really showed up 24 to 48 hours later. We have been using gentamicin and Hydrofera Blue to both areas. 4/20; patient presents for follow-up. She states that her orthopedic surgeon drained the hematoma to her left lateral thigh. She has no open wounds to this area. She states she tolerated the compression wrap well up to the left lower extremity. She has no issues or complaints today. She denies signs of infection. 4/28; laceration on the left lower extremity following a  fall. Since I last saw this the sutures have been removed but the wound is dehisced and the surface is necrotic. We have been using gentamicin and Hydrofera Blue She tells me that the hematoma was seen by orthopedic surgery and they drained this using a large bore needle. We did not look at this today. Finally she is going to [facility] for relatives graduation next Friday we will bring her in early for a nurse visit to change the dressing she will be back in time to be seen the week after next 5/12; patient presents for follow-up. She has been using antibiotic ointment with Hydrofera Blue to the wound bed. She denies signs of infection. She has no issues or complaints today. 5/19; patient presents for follow-up. She tolerated the compression wrap well. We have been using gentamicin and Hydrofera Blue under Kerlix/Coban. She has no complaints today. 5/25; patient presents for follow-up. We will continue to use gentamicin and Hydrofera Blue under Kerlix/Coban. She has no issues or complaints today. She denies signs of infection. 6/2; Patient presents for follow up. Patient has been using hydrofera blue under kerlix/coban. She reports improvement in wound healing. She denies signs  of infection. Patient History Information obtained from Patient. Family History Lung Disease - Father, No family history of Cancer, Diabetes, Heart Disease, Hypertension, Kidney Disease, Seizures, Stroke, Thyroid Problems, Tuberculosis. Social History Never smoker, Marital Status - Married, Alcohol Use - Never, Drug Use - No History, Caffeine Use - Never. Medical History Musculoskeletal Patient has history of Rheumatoid Arthritis Medical A Surgical History Notes nd Eyes occasional double vision sometimes loses balance Respiratory interstitial lung disease Objective Constitutional respirations regular, non-labored and within target range for patient.. Vitals Time Taken: 10:49 AM, Height: 65 in, Weight: 133 lbs, BMI: 22.1, Temperature: 98 F, Pulse: 74 bpm, Respiratory Rate: 17 breaths/min, Blood Pressure: 122/74 mmHg. Cardiovascular 2+ dorsalis pedis/posterior tibialis pulses. Psychiatric pleasant and cooperative. General Notes: Left lower extremity: T the anterior aspect there is an open wound with granulation tissue and non viable tissue. No surrounding signs of o infection. Integumentary (Hair, Skin) Wound #4 status is Open. Original cause of wound was Trauma. The date acquired was: 10/08/2021. The wound has been in treatment 8 weeks. The wound is located on the Left,Anterior Lower Leg. The wound measures 2.3cm length x 0.9cm width x 0.2cm depth; 1.626cm^2 area and 0.325cm^3 volume. There is Fat Layer (Subcutaneous Tissue) exposed. There is no tunneling or undermining noted. There is a medium amount of serosanguineous drainage noted. The wound margin is distinct with the outline attached to the wound base. There is medium (34-66%) red, pink granulation within the wound bed. There is a medium (34-66%) amount of necrotic tissue within the wound bed including Adherent Slough. Assessment Active Problems ICD-10 Non-pressure chronic ulcer of other part of left lower leg with  unspecified severity Abrasion, left lower leg, initial encounter Contusion of left hip, subsequent encounter Other early complications of trauma, initial encounter Rheumatoid arthritis, unspecified Patient's wound has shown improvement in size and appearance since last clinic visit. I debrided nonviable tissue. I recommended continuing with antibiotic ointment and Hydrofera Blue under compression therapy. Follow-up in 1 week. Procedures Wound #4 Pre-procedure diagnosis of Wound #4 is a Trauma, Other located on the Left,Anterior Lower Leg . There was a Excisional Skin/Subcutaneous Tissue Debridement with a total area of 2.07 sq cm performed by Kalman Shan, DO. With the following instrument(s): Curette to remove Viable and Non-Viable tissue/material. Material removed includes Subcutaneous Tissue and Slough and  after achieving pain control using Lidocaine. No specimens were taken. A time out was conducted at 11:00, prior to the start of the procedure. A Minimum amount of bleeding was controlled with Pressure. The procedure was tolerated well with a pain level of 0 throughout and a pain level of 0 following the procedure. Post Debridement Measurements: 2.3cm length x 0.9cm width x 0.2cm depth; 0.325cm^3 volume. Character of Wound/Ulcer Post Debridement is improved. Post procedure Diagnosis Wound #4: Same as Pre-Procedure Plan Follow-up Appointments: Return Appointment in 1 week. - on Thursday just for next week w/ Dr. Heber [place] Dellia Nims covering) and [facility] Room # 9 12/18/21 @ 1430 Bathing/ Shower/ Hygiene: May shower and wash wound with soap and water. Edema Control - Lymphedema / SCD / Other: Elevate legs to the level of the heart or above for 30 minutes daily and/or when sitting, a frequency of: - elevate legs when sitting Avoid standing for long periods of time. Moisturize legs daily. - keep skin soft with moisturizer, do not get moisturizer on wound bed Additional Orders /  Instructions: Follow Nutritious Diet - increase protein intake Home Health: No change in wound care orders this week; continue Home Health for wound care. May utilize formulary equivalent dressing for wound treatment orders unless otherwise specified. Other Home Health Orders/Instructions: - [facility] WOUND #4: - Lower Leg Wound Laterality: Left, Anterior Cleanser: Soap and Water 1 x Per Day/15 Days Discharge Instructions: May shower and wash wound with dial antibacterial soap and water prior to dressing change. Cleanser: Wound Cleanser (Generic) 1 x Per Day/15 Days Discharge Instructions: Cleanse the wound with wound cleanser prior to applying a clean dressing using gauze sponges, not tissue or cotton balls. Topical: Gentamicin 1 x Per Day/15 Days Discharge Instructions: apply to wound bed at dressing change Prim Dressing: Hydrofera Blue Ready Foam, 4x5 in (Generic) 1 x Per Day/15 Days ary Discharge Instructions: Apply to wound bed as instructed Secondary Dressing: Woven Gauze Sponge, Non-Sterile 4x4 in (Generic) 1 x Per Day/15 Days Discharge Instructions: Apply over primary dressing as directed. Com pression Wrap: Kerlix Roll 4.5x3.1 (in/yd) (Generic) 1 x Per Day/15 Days Discharge Instructions: Apply Kerlix and Coban compression as directed. Com pression Wrap: Coban Self-Adherent Wrap 4x5 (in/yd) (Generic) 1 x Per Day/15 Days Discharge Instructions: Apply over Kerlix as directed. 1. In office sharp debridement 2. Hydrofera blue with gentamicin ointment under kerlix/coban 3. Follow up in 1 week Electronic Signature(s) Signed: 12/13/2021 11:52:36 PM By: Kalman Shan DO Entered By: Kalman Shan on 12/13/2021 23:52:36 -------------------------------------------------------------------------------- HxROS Details Patient Name: Date of Service: Stacey [name], Stacey [name] Donovan RET M. 12/12/2021 10:45 A M Medical Record Number: [id number] Patient Account Number: [id number] Date of Birth/Sex: Treating  RN: 1941/05/13 (81 y.o. [name], [name] Primary Care Provider: Shon Baton Other Clinician: Referring Provider: Treating Provider/Extender: Cherre Huger in Treatment: 8 Information Obtained From Patient Eyes Medical History: Past Medical History Notes: occasional double vision sometimes loses balance Respiratory Medical History: Past Medical History Notes: interstitial lung disease Musculoskeletal Medical History: Positive for: Rheumatoid Arthritis Immunizations Pneumococcal Vaccine: Received Pneumococcal Vaccination: Yes Received Pneumococcal Vaccination On or After 60th Birthday: Yes Implantable Devices None Family and Social History Cancer: No; Diabetes: No; Heart Disease: No; Hypertension: No; Kidney Disease: No; Lung Disease: Yes - Father; Seizures: No; Stroke: No; Thyroid Problems: No; Tuberculosis: No; Never smoker; Marital Status - Married; Alcohol Use: Never; Drug Use: No History; Caffeine Use: Never; Financial Concerns: No; Food, Clothing or Shelter Needs: No; Support System Lacking: No; Transportation  Concerns: No Electronic Signature(s) Signed: 12/14/2021 12:16:43 AM By: Kalman Shan DO Signed: 12/15/2021 4:10:20 PM By: Rhae Hammock RN Entered By: Kalman Shan on 12/13/2021 23:49:59 -------------------------------------------------------------------------------- SuperBill Details Patient Name: Date of Service: Stacey [name], [place] RET M. 12/12/2021 Medical Record Number: [id number] Patient Account Number: [id number] Date of Birth/Sex: Treating RN: 1941-01-08 (81 y.o. [name], [name] Primary Care Provider: Shon Baton Other Clinician: Referring Provider: Treating Provider/Extender: Cherre Huger in Treatment: 8 Diagnosis Coding ICD-10 Codes Code Description [phone] Non-pressure chronic ulcer of other part of left lower leg with unspecified severity S80.812A Abrasion, left lower leg, initial  encounter S70.02XD Contusion of left hip, subsequent encounter T79.8XXA Other early complications of trauma, initial encounter M06.9 Rheumatoid arthritis, unspecified Facility Procedures CPT4 Code: [id number] Description: 57334 - DEB SUBQ TISSUE 20 SQ CM/< ICD-10 Diagnosis Description 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

## 2021-12-18 ENCOUNTER — Encounter (HOSPITAL_BASED_OUTPATIENT_CLINIC_OR_DEPARTMENT_OTHER): Payer: Medicare Other | Admitting: Internal Medicine

## 2021-12-18 DIAGNOSIS — L97822 Non-pressure chronic ulcer of other part of left lower leg with fat layer exposed: Secondary | ICD-10-CM | POA: Diagnosis not present

## 2021-12-18 DIAGNOSIS — S80812A Abrasion, left lower leg, initial encounter: Secondary | ICD-10-CM | POA: Diagnosis not present

## 2021-12-18 DIAGNOSIS — J849 Interstitial pulmonary disease, unspecified: Secondary | ICD-10-CM | POA: Diagnosis not present

## 2021-12-18 DIAGNOSIS — M069 Rheumatoid arthritis, unspecified: Secondary | ICD-10-CM | POA: Diagnosis not present

## 2021-12-18 DIAGNOSIS — T798XXA Other early complications of trauma, initial encounter: Secondary | ICD-10-CM | POA: Diagnosis not present

## 2021-12-18 DIAGNOSIS — S7002XA Contusion of left hip, initial encounter: Secondary | ICD-10-CM | POA: Diagnosis not present

## 2021-12-18 DIAGNOSIS — L97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 with unspecified severity: Secondary | ICD-10-CM | POA: Diagnosis not present

## 2021-12-18 NOTE — Progress Notes (Signed)
[name], [name] ([id number]) Visit Report for 12/18/2021 HPI Details Patient Name: Date of Service: [name], [name] 12/18/2021 2:30 PM Medical Record Number: [id number] Patient Account Number: [id number] Date of Birth/Sex: Treating RN: 06/01/1941 (80 y.o. F) Stacey [name] Primary Care Provider: Russo, [name] Other Clinician: Referring Provider: Treating Provider/Extender: Stacey [name] Stacey [name] Stacey [name] in Treatment: 9 History of Present Illness HPI Description: Admission 10/16/2021 [name] is an 80-year-old female with a past medical history of rheumatoid arthritis, interstitial lung disease that presents to the clinic for a 1 week history of wounds to her left lower extremity following a fall. She visited the ED following the trauma. She had 25 sutures placed to the left lower extremity wound. She also reports a hematoma to the left lateral thigh with an abrasion mark. She was started on doxycycline in the ED and she completed this. She currently denies signs of infection. 4/13; patient admitted the clinic last week after a fall. She had a fairly sizable laceration on the left lower leg that was sutured in the ER. Sutures are still in place. Also problematic she has a large hematoma over the left lateral hip. Apparently she did not have any x-rays done when she was in the ER of this area because it only really showed up 24 to 48 hours later. We have been using gentamicin and Hydrofera Blue to both areas. 4/20; patient presents for follow-up. She states that her orthopedic surgeon drained the hematoma to her left lateral thigh. She has no open wounds to this area. She states she tolerated the compression wrap well up to the left lower extremity. She has no issues or complaints today. She denies signs of infection. 4/28; laceration on the left lower extremity following a fall. Since I last saw this the sutures have been removed but the wound is dehisced and the surface  is necrotic. We have been using gentamicin and Hydrofera Blue She tells me that the hematoma was seen by orthopedic surgery and they drained this using a large bore needle. We did not look at this today. Finally she is going to [place] for relatives graduation next Friday we will bring her in early for a nurse visit to change the dressing she will be back in time to be seen the week after next 5/12; patient presents for follow-up. She has been using antibiotic ointment with Hydrofera Blue to the wound bed. She denies signs of infection. She has no issues or complaints today. 5/19; patient presents for follow-up. She tolerated the compression wrap well. We have been using gentamicin and Hydrofera Blue under Kerlix/Coban. She has no complaints today. 5/25; patient presents for follow-up. We will continue to use gentamicin and Hydrofera Blue under Kerlix/Coban. She has no issues or complaints today. She denies signs of infection. 6/2; Patient presents for follow up. Patient has been using hydrofera blue under kerlix/coban. She reports improvement in wound healing. She denies signs of infection. 6/8; left lateral leg wound which was traumatic in the setting of chronic venous insufficiency. She is using topical antibiotics and Hydrofera Blue under kerlix Coban. The wound looks as though it is doing very well Electronic Signature(s) Signed: 12/18/2021 4:23:09 PM By: Robson, Michael MD Entered By: Stacey [name] on 12/18/2021 15:11:58 -------------------------------------------------------------------------------- Physical Exam Details Patient Name: Date of Service: [name], [name] 12/18/2021 2:30 PM Medical Record Number: [id number] Patient Account Number: [id number] Date of Birth/Sex: Treating RN: 03/27/1941 (80 y.o. F) Stacey [name] Primary Care Provider: Russo, [name]   Other Clinician: Referring Provider: Treating Provider/Extender: Stacey [name] Stacey [name] Stacey [name] in Treatment:  9 Constitutional Patient is hypertensive.. Pulse regular and within target range for patient.. Respirations regular, non-labored and within target range.. Temperature is normal and within the target range for the patient.. Appears in no distress. Notes Wound examined left lateral lower extremity. The vast majority of this is healed in the distal aspect of the wound is still open. Surface of this looks healthy no debridement was necessary. She has chronic venous insufficiency her edema control is reasonable Electronic Signature(s) Signed: 12/18/2021 4:23:09 PM By: Robson, Michael MD Entered By: Stacey [name] on 12/18/2021 15:12:55 -------------------------------------------------------------------------------- Physician Orders Details Patient Name: Date of Service: [name], [name] 12/18/2021 2:30 PM Medical Record Number: [id number] Patient Account Number: [id number] Date of Birth/Sex: Treating RN: 12/15/1940 (80 y.o. 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 - on Thursday just for next week w/ Dr. HOffman and Lauran Room # 9 12/25/21 @ 1430 Return A Other: - purchase support stockings- bring in weekly for when wound closes. Bathing/ Shower/ Hygiene May shower and wash wound with soap and water. Edema Control - Lymphedema / SCD / Other Elevate legs to the level of the heart or above for 30 minutes daily and/or when sitting, a frequency of: -  elevate legs when sitting Avoid standing for long periods of time. Moisturize legs daily. - keep skin soft with moisturizer, do not get moisturizer on wound bed Additional Orders / Instructions Follow Nutritious Diet - increase protein intake Home Health Discontinue home health for wound care. Other Home Health Orders/Instructions: - [facility] Wound Treatment Wound #4 - Lower Leg Wound Laterality: Left, Anterior Cleanser: Soap and Water 1 x Per Week/15 Days Discharge Instructions: May shower and wash wound with dial antibacterial soap and water prior to dressing change. Cleanser: Wound Cleanser (Generic) 1 x Per Week/15 Days Discharge Instructions: Cleanse the wound with wound cleanser prior to applying a clean dressing using gauze sponges, not tissue or cotton balls. Topical: Gentamicin 1 x Per Week/15 Days Discharge Instructions: apply to wound bed at dressing change Prim Dressing: Hydrofera Blue Ready Foam, 4x5 in (Generic) 1 x Per Week/15 Days ary Discharge Instructions: Apply to wound bed as instructed Secondary Dressing: Woven Gauze Sponge, Non-Sterile 4x4 in (Generic) 1 x Per Week/15 Days Discharge Instructions: Apply over primary dressing as directed. Compression Wrap: Kerlix Roll 4.5x3.1 (in/yd) (Generic) 1 x Per Week/15 Days Discharge Instructions: Apply Kerlix and Coban compression as directed. Compression Wrap: Coban Self-Adherent Wrap 4x5 (in/yd) (Generic) 1 x Per Week/15 Days Discharge Instructions: Apply over Kerlix as directed. Electronic Signature(s) Signed: 12/18/2021 4:23:09 PM By: Robson, Michael MD Signed: 12/18/2021 4:29:12 PM By: Deaton, Bobbi RN, BSN Entered By: Stacey [name] on 12/18/2021 15:11:05 -------------------------------------------------------------------------------- Problem List Details Patient Name: Date of Service: Bartram, [name] 12/18/2021 2:30 PM Medical Record Number: [id number] Patient Account Number: [id number] Date of  Birth/Sex: Treating RN: 10/07/1940 (80 y.o. 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 with unspecified 10/16/2021 No Yes severity S80.812A Abrasion, left lower leg, initial encounter 10/16/2021 No Yes S70.02XD Contusion of left hip, subsequent encounter 10/23/2021 No Yes T79.8XXA Other early complications of trauma, initial encounter 10/16/2021 No Yes M06.9 Rheumatoid arthritis, unspecified 10/16/2021 No Yes Inactive Problems Resolved Problems Electronic Signature(s) Signed: 12/18/2021 4:23:09 PM By: Linton Ham MD Entered By: Linton Ham on 12/18/2021 15:11:01 -------------------------------------------------------------------------------- Progress Note Details Patient Name: Date of Service: [name], [name]. 12/18/2021 2:30 PM Medical Record Number: [id number] Patient Account Number: [id number] Date of Birth/Sex: Treating RN: 03-29-1941 (81 y.o. [name], [name] Primary Care Provider: Shon Baton Other Clinician: Referring Provider: Treating Provider/Extender: [name] in Treatment: 9 Subjective History of Present Illness (HPI) Admission 10/16/2021 [name] is an 81 year old female with a past medical history of rheumatoid arthritis, interstitial lung disease that presents to the clinic for a 1 week history of wounds to her left lower extremity following a fall. She visited the ED following the trauma. She had 25 sutures placed to the left lower extremity wound. She also reports a hematoma to the left lateral thigh with an abrasion mark. She was started on doxycycline in the ED and she completed this. She currently denies signs of infection. 4/13; patient admitted the  clinic last week after a fall. She had a fairly sizable laceration on the left lower leg that was sutured in the ER. Sutures are still in place. Also problematic she has a large hematoma over the left lateral hip. Apparently she did not have any x-rays done when she was in the ER of this area because it only really showed up 24 to 48 hours later. We have been using gentamicin and Hydrofera Blue to both areas. 4/20; patient presents for follow-up. She states that her orthopedic surgeon drained the hematoma to her left lateral thigh. She has no open wounds to this area. She states she tolerated the compression wrap well up to the left lower extremity. She has no issues or complaints today. She denies signs of infection. 4/28; laceration on the left lower extremity following a fall. Since I last saw this the sutures have been removed but the wound is dehisced and the surface is necrotic. We have been using gentamicin and Hydrofera Blue She tells me that the hematoma was seen by orthopedic surgery and they drained this using a large bore needle. We did not look at this today. Finally she is going to [facility] for relatives graduation next Friday we will bring her in early for a nurse visit to change the dressing she will be back in time to be seen the week after next 5/12; patient presents for follow-up. She has been using antibiotic ointment with Hydrofera Blue to the wound bed. She denies signs of infection. She has no issues or complaints today. 5/19; patient presents for follow-up. She tolerated the compression wrap well. We have been using gentamicin and Hydrofera Blue under Kerlix/Coban. She has no complaints today. 5/25; patient presents for follow-up. We will continue to use gentamicin and Hydrofera Blue under Kerlix/Coban. She has no issues or complaints today. She denies signs of infection. 6/2; Patient presents for follow up. Patient has been using hydrofera blue under kerlix/coban. She reports  improvement in wound healing. She denies signs of infection. 6/8; left lateral leg wound which was traumatic in the setting of chronic  venous insufficiency. She is using topical antibiotics and Hydrofera Blue under kerlix Coban. The wound looks as though it is doing very well Objective Constitutional Patient is hypertensive.. Pulse regular and within target range for patient.. Respirations regular, non-labored and within target range.. Temperature is normal and within the target range for the patient.. Appears in no distress. Vitals Time Taken: 2:38 PM, Height: 65 in, Weight: 133 lbs, BMI: 22.1, Temperature: 97.8 °F, Pulse: 76 bpm, Respiratory Rate: 18 breaths/min, Blood Pressure: 160/63 mmHg. General Notes: Wound examined left lateral lower extremity. The vast majority of this is healed in the distal aspect of the wound is still open. Surface of this looks healthy no debridement was necessary. She has chronic venous insufficiency her edema control is reasonable Integumentary (Hair, Skin) Wound #4 status is Open. Original cause of wound was Trauma. The date acquired was: 10/08/2021. The wound has been in treatment 9 Stacey [name]. The wound is located on the Left,Anterior Lower Leg. The wound measures 1.8cm length x 0.6cm width x 0.2cm depth; 0.848cm^2 area and 0.17cm^3 volume. There is Fat Layer (Subcutaneous Tissue) exposed. There is no tunneling or undermining noted. There is a medium amount of serosanguineous drainage noted. The wound margin is distinct with the outline attached to the wound base. There is large (67-100%) red, pink granulation within the wound bed. There is a small (1-33%) amount of necrotic tissue within the wound bed including Adherent Slough. Assessment Active Problems ICD-10 Non-pressure chronic ulcer of other part of left lower leg with unspecified severity Abrasion, left lower leg, initial encounter Contusion of left hip, subsequent encounter Other early complications of  trauma, initial encounter Rheumatoid arthritis, unspecified Plan Follow-up Appointments: Return Appointment in 1 week. - on Thursday just for next week w/ Dr. HOffman and Lauran Room # 9 12/25/21 @ 1430 Other: - purchase support stockings- bring in weekly for when wound closes. Bathing/ Shower/ Hygiene: May shower and wash wound with soap and water. Edema Control - Lymphedema / SCD / Other: Elevate legs to the level of the heart or above for 30 minutes daily and/or when sitting, a frequency of: - elevate legs when sitting Avoid standing for long periods of time. Moisturize legs daily. - keep skin soft with moisturizer, do not get moisturizer on wound bed Additional Orders / Instructions: Follow Nutritious Diet - increase protein intake Home Health: Discontinue home health for wound care. Other Home Health Orders/Instructions: - [facility] WOUND #4: - Lower Leg Wound Laterality: Left, Anterior Cleanser: Soap and Water 1 x Per Week/15 Days Discharge Instructions: May shower and wash wound with dial antibacterial soap and water prior to dressing change. Cleanser: Wound Cleanser (Generic) 1 x Per Week/15 Days Discharge Instructions: Cleanse the wound with wound cleanser prior to applying a clean dressing using gauze sponges, not tissue or cotton balls. Topical: Gentamicin 1 x Per Week/15 Days Discharge Instructions: apply to wound bed at dressing change Prim Dressing: Hydrofera Blue Ready Foam, 4x5 in (Generic) 1 x Per Week/15 Days ary Discharge Instructions: Apply to wound bed as instructed Secondary Dressing: Woven Gauze Sponge, Non-Sterile 4x4 in (Generic) 1 x Per Week/15 Days Discharge Instructions: Apply over primary dressing as directed. Com pression Wrap: Kerlix Roll 4.5x3.1 (in/yd) (Generic) 1 x Per Week/15 Days Discharge Instructions: Apply Kerlix and Coban compression as directed. Com pression Wrap: Coban Self-Adherent Wrap 4x5 (in/yd) (Generic) 1 x Per Week/15 Days Discharge  Instructions: Apply over Kerlix as directed. 1. We continued with the same dressing Hydrofera Blue under kerlix Coban 2.   Careful attention to the measurements of the small area next time. Might require a collagen-based dressing to get this to fill-in otherwise it appears to be doing well today Electronic Signature(s) Signed: 12/18/2021 4:23:09 PM By: Robson, Michael MD Entered By: Stacey [name] on 12/18/2021 15:15:50 -------------------------------------------------------------------------------- SuperBill Details Patient Name: Date of Service: Debruyn, [name] 12/18/2021 Medical Record Number: [id number] Patient Account Number: [id number] Date of Birth/Sex: Treating RN: 03/27/1941 (80 y.o. 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 S80.812A Abrasion, left lower leg, initial encounter S70.02XD Contusion of left hip, subsequent encounter T79.8XXA Other early complications of trauma, initial encounter M06.9 Rheumatoid arthritis, unspecified Facility Procedures Physician Procedures : CPT4 Code Description Modifier [id number] - WC PHYS LEVEL 3 - EST PT ICD-10 Diagnosis Description 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 

## 2021-12-23 ENCOUNTER — Telehealth: Payer: Self-pay | Admitting: Pharmacist

## 2021-12-23 NOTE — Progress Notes (Signed)
JOLEENA, WEISENBURGER ([id number]) Visit Report for 12/04/2021 Arrival Information Details Patient Name: Date of Service: [place], [name] 12/04/2021 3:15 PM Medical Record Number: [id number] Patient Account Number: [id number] Date of Birth/Sex: Treating RN: [birth year] (81 y.o. [name], [name] Primary Care Trini Soldo: Shon Baton Other Clinician: Referring Sheretha Shadd: Treating Ting Cage/Extender: Cherre Huger in Treatment: 7 Visit Information History Since Last Visit Added or deleted any medications: No Patient Arrived: Ambulatory Any new allergies or adverse reactions: No Arrival Time: 15:12 Had a fall or experienced change in No Accompanied By: self activities of daily living that may affect Transfer Assistance: None risk of falls: Patient Identification Verified: Yes Signs or symptoms of abuse/neglect since last visito No Secondary Verification Process Completed: Yes Hospitalized since last visit: No Patient Requires Transmission-Based Precautions: No Implantable device outside of the clinic excluding No cellular tissue based products placed in the center since last visit: Has Dressing in Place as Prescribed: Yes Has Compression in Place as Prescribed: Yes Pain Present Now: No Electronic Signature(s) Signed: 12/04/2021 3:50:23 PM By: Rhae Hammock RN Entered By: Rhae Hammock on 12/04/2021 15:14:29 -------------------------------------------------------------------------------- Encounter Discharge Information Details Patient Name: Date of Service: [place], [name]. 12/04/2021 3:15 PM Medical Record Number: [id number] Patient Account Number: [id number] Date of Birth/Sex: Treating RN: 1940/09/08 (81 y.o. [name], [name] Primary Care Zora Glendenning: Shon Baton Other Clinician: Referring Perel Hauschild: Treating Bronson Bressman/Extender: Cherre Huger in Treatment: 7 Encounter Discharge Information Items Post Procedure  Vitals Discharge Condition: Stable Temperature (F): 98.7 Ambulatory Status: Ambulatory Pulse (bpm): 74 Discharge Destination: Home Respiratory Rate (breaths/min): 17 Transportation: Private Auto Blood Pressure (mmHg): 134/74 Accompanied By: self Schedule Follow-up Appointment: Yes Clinical Summary of Care: Patient Declined Electronic Signature(s) Signed: 12/04/2021 3:50:23 PM By: Rhae Hammock RN Entered By: Rhae Hammock on 12/04/2021 15:49:34 -------------------------------------------------------------------------------- Lower Extremity Assessment Details Patient Name: Date of Service: [place], [name]. 12/04/2021 3:15 PM Medical Record Number: [id number] Patient Account Number: [id number] Date of Birth/Sex: Treating RN: 01/19/1941 (81 y.o. [name], [name] Primary Care Sajid Ruppert: Shon Baton Other Clinician: Referring Swetha Rayle: Treating Tashai Catino/Extender: Cherre Huger in Treatment: 7 Edema Assessment Assessed: [name]: Yes] [name]: No] Edema: [Left: Ye] [Right: s] Calf Left: Right: Point of Measurement: 32 cm From Medial Instep 30 cm Ankle Left: Right: Point of Measurement: 11 cm From Medial Instep 19 cm Vascular Assessment Pulses: Dorsalis Pedis Palpable: [Left:Yes] Posterior Tibial Palpable: [Left:Yes] Electronic Signature(s) Signed: 12/04/2021 3:50:23 PM By: Rhae Hammock RN Entered By: Rhae Hammock on 12/04/2021 15:14:55 -------------------------------------------------------------------------------- Multi Wound Chart Details Patient Name: Date of Service: [name], [name]. 12/04/2021 3:15 PM Medical Record Number: [id number] Patient Account Number: [id number] Date of Birth/Sex: Treating RN: 1941-03-22 (81 y.o. [name], [name] Primary Care Franco Duley: Shon Baton Other Clinician: Referring Leina Babe: Treating Teia Freitas/Extender: Cherre Huger in Treatment: 7 Vital Signs Height(in):  65 Pulse(bpm): 3 Weight(lbs): 133 Blood Pressure(mmHg): 153/74 Body Mass Index(BMI): 22.1 Temperature(F): 97.7 Respiratory Rate(breaths/min): 17 Photos: [4:Left, Anterior Lower Leg] [N/A:N/A N/A] Wound Location: [4:Trauma] [N/A:N/A] Wounding Event: [4:Trauma, Other] [N/A:N/A] Primary Etiology: [4:Rheumatoid Arthritis] [N/A:N/A] Comorbid History: [4:10/08/2021] [N/A:N/A] Date Acquired: [4:7] [N/A:N/A] Weeks of Treatment: [4:Open] [N/A:N/A] Wound Status: [4:No] [N/A:N/A] Wound Recurrence: [4:7x1.2x0.2] [N/A:N/A] Measurements L x W x D (cm) [4:6.597] [N/A:N/A] A (cm) : rea [4:1.319] [N/A:N/A] Volume (cm) : [4:83.80%] [N/A:N/A] % Reduction in A rea: [4:83.80%] [N/A:N/A] % Reduction in Volume: [4:Full Thickness Without Exposed] [N/A:N/A] Classification: [4:Support Structures Medium] [N/A:N/A] Exudate A mount: [4:Serosanguineous] [N/A:N/A]  Exudate Type: [4:red, brown] [N/A:N/A] Exudate Color: [4:Distinct, outline attached] [N/A:N/A] Wound Margin: [4:Medium (34-66%)] [N/A:N/A] Granulation A mount: [4:Red, Pink] [N/A:N/A] Granulation Quality: [4:Medium (34-66%)] [N/A:N/A] Necrotic A mount: [4:Fat Layer (Subcutaneous Tissue): Yes N/A] Exposed Structures: [4:Fascia: No Tendon: No Muscle: No Joint: No Bone: No Medium (34-66%)] [N/A:N/A] Epithelialization: [4:Debridement - Excisional] [N/A:N/A] Debridement: Pre-procedure Verification/Time Out 15:30 [N/A:N/A] Taken: [4:Lidocaine] [N/A:N/A] Pain Control: [4:Subcutaneous, Slough] [N/A:N/A] Tissue Debrided: [4:Skin/Subcutaneous Tissue] [N/A:N/A] Level: [4:8.4] [N/A:N/A] Debridement A (sq cm): [4:rea Curette] [N/A:N/A] Instrument: [4:Minimum] [N/A:N/A] Bleeding: [4:Pressure] [N/A:N/A] Hemostasis A chieved: [4:0] [N/A:N/A] Procedural Pain: [4:0] [N/A:N/A] Post Procedural Pain: [4:Procedure was tolerated well] [N/A:N/A] Debridement Treatment Response: [4:7x1.2x0.2] [N/A:N/A] Post Debridement Measurements L x W x D (cm) [4:1.319]  [N/A:N/A] Post Debridement Volume: (cm) [4:Debridement] [N/A:N/A] Treatment Notes Wound #4 (Lower Leg) Wound Laterality: Left, Anterior Cleanser Soap and Water Discharge Instruction: May shower and wash wound with dial antibacterial soap and water prior to dressing change. Wound Cleanser Discharge Instruction: Cleanse the wound with wound cleanser prior to applying a clean dressing using gauze sponges, not tissue or cotton balls. Peri-Wound Care Topical Gentamicin Discharge Instruction: apply to wound bed at dressing change Primary Dressing Hydrofera Blue Ready Foam, 4x5 in Discharge Instruction: Apply to wound bed as instructed Secondary Dressing Woven Gauze Sponge, Non-Sterile 4x4 in Discharge Instruction: Apply over primary dressing as directed. Secured With Compression Wrap Kerlix Roll 4.5x3.1 (in/yd) Discharge Instruction: Apply Kerlix and Coban compression as directed. Coban Self-Adherent Wrap 4x5 (in/yd) Discharge Instruction: Apply over Kerlix as directed. Compression Stockings Add-Ons Electronic Signature(s) Signed: 12/04/2021 4:05:15 PM By: Kalman Shan DO Signed: 12/10/2021 3:17:18 PM By: Rhae Hammock RN Entered By: Kalman Shan on 12/04/2021 16:02:43 -------------------------------------------------------------------------------- Multi-Disciplinary Care Plan Details Patient Name: Date of Service: [place], [place] RET M. 12/04/2021 3:15 PM Medical Record Number: [id number] Patient Account Number: [id number] Date of Birth/Sex: Treating RN: [birth year] (81 y.o. [name], [name] Primary Care Kenya Kook: Shon Baton Other Clinician: Referring Mohammad Granade: Treating Bettyjo Lundblad/Extender: Cherre Huger in Treatment: 7 Active Inactive Wound/Skin Impairment Nursing Diagnoses: Impaired tissue integrity Goals: Ulcer/skin breakdown will have a volume reduction of 30% by week 4 Date Initiated: 10/16/2021 Date Inactivated: 11/21/2021 Target  Resolution Date: 11/13/2021 Goal Status: Met Ulcer/skin breakdown will have a volume reduction of 50% by week 8 Date Initiated: 11/21/2021 Target Resolution Date: 12/11/2021 Goal Status: Active Interventions: Assess patient/caregiver ability to obtain necessary supplies Provide education on ulcer and skin care Notes: Electronic Signature(s) Signed: 12/04/2021 3:50:23 PM By: Rhae Hammock RN Entered By: Rhae Hammock on 12/04/2021 15:30:29 -------------------------------------------------------------------------------- Pain Assessment Details Patient Name: Date of Service: [place] RET M. 12/04/2021 3:15 PM Medical Record Number: [id number] Patient Account Number: [id number] Date of Birth/Sex: Treating RN: Dec 07, 1940 (81 y.o. [name], [name] Primary Care Anthonny Schiller: Shon Baton Other Clinician: Referring Dana Dorner: Treating Oasis Goehring/Extender: Cherre Huger in Treatment: 7 Active Problems Location of Pain Severity and Description of Pain Patient Has Paino No Site Locations Pain Management and Medication Current Pain Management: Electronic Signature(s) Signed: 12/04/2021 3:50:23 PM By: Rhae Hammock RN Entered By: Rhae Hammock on 12/04/2021 15:14:47 -------------------------------------------------------------------------------- Patient/Caregiver Education Details Patient Name: Date of Service: [place] RET M. 5/25/2023andnbsp3:15 PM Medical Record Number: [id number] Patient Account Number: [id number] Date of Birth/Gender: Treating RN: [birth year] (81 y.o. [name], [name] Primary Care Physician: Shon Baton Other Clinician: Referring Physician: Treating Physician/Extender: Cherre Huger in Treatment: 7 Education Assessment Education Provided To: Patient Education Topics Provided Wound/Skin Impairment: Methods: Explain/Verbal Responses: Reinforcements needed,  State content correctly Electronic  Signature(s) Signed: 12/04/2021 3:50:23 PM By: Rhae Hammock RN Entered By: Rhae Hammock on 12/04/2021 15:30:44 -------------------------------------------------------------------------------- Wound Assessment Details Patient Name: Date of Service: [place], [place] RET M. 12/04/2021 3:15 PM Medical Record Number: [id number] Patient Account Number: [id number] Date of Birth/Sex: Treating RN: Feb 21, 1941 (81 y.o. [name], [name] Primary Care Eleftherios Dudenhoeffer: Shon Baton Other Clinician: Referring Maylynn Orzechowski: Treating Brooke Payes/Extender: Cherre Huger in Treatment: 7 Wound Status Wound Number: 4 Primary Etiology: Trauma, Other Wound Location: Left, Anterior Lower Leg Wound Status: Open Wounding Event: Trauma Comorbid History: Rheumatoid Arthritis Date Acquired: 10/08/2021 Weeks Of Treatment: 7 Clustered Wound: No Photos Wound Measurements Length: (cm) 7 Width: (cm) 1.2 Depth: (cm) 0.2 Area: (cm) 6.597 Volume: (cm) 1.319 % Reduction in Area: 83.8% % Reduction in Volume: 83.8% Epithelialization: Medium (34-66%) Wound Description Classification: Full Thickness Without Exposed Support Structures Wound Margin: Distinct, outline attached Exudate Amount: Medium Exudate Type: Serosanguineous Exudate Color: red, brown Foul Odor After Cleansing: No Slough/Fibrino Yes Wound Bed Granulation Amount: Medium (34-66%) Exposed Structure Granulation Quality: Red, Pink Fascia Exposed: No Necrotic Amount: Medium (34-66%) Fat Layer (Subcutaneous Tissue) Exposed: Yes Necrotic Quality: Adherent Slough Tendon Exposed: No Muscle Exposed: No Joint Exposed: No Bone Exposed: No Electronic Signature(s) Signed: 12/04/2021 3:50:23 PM By: Rhae Hammock RN Signed: 12/23/2021 8:46:54 AM By: Erenest Blank Entered By: Erenest Blank on 12/04/2021 15:24:50 -------------------------------------------------------------------------------- Vitals Details Patient Name: Date of  Service: [name], [name]. 12/04/2021 3:15 PM Medical Record Number: [id number] Patient Account Number: [id number] Date of Birth/Sex: Treating RN: 1941/01/31 (81 y.o. [name], [name] Primary Care Donnie Gedeon: Shon Baton Other Clinician: Referring Revella Shelton: Treating Kathaleen Dudziak/Extender: Cherre Huger in Treatment: 7 Vital Signs Time Taken: 15:15 Temperature (F): 97.7 Height (in): 65 Pulse (bpm): 74 Weight (lbs): 133 Respiratory Rate (breaths/min): 17 Body Mass Index (BMI): 22.1 Blood Pressure (mmHg): 153/74 Reference Range: 80 - 120 mg / dl Electronic Signature(s) Signed: 12/04/2021 3:50:23 PM By: Rhae Hammock RN Entered By: Rhae Hammock on 12/04/2021 15:14:42

## 2021-12-23 NOTE — Progress Notes (Signed)
Stacey [name], Stacey [name] ([id number]) Visit Report for 11/21/2021 Arrival Information Details Patient Name: Date of Service: Stacey [name], Stacey [name]. 11/21/2021 9:30 [name] Medical Record Number: [id number] Patient Account Number: [id number] Date of Birth/Sex: Treating RN: 1941/04/05 (81 y.o. Stacey [name] Primary Care Provider: Shon [name] Other Clinician: Referring Provider: Treating Provider/Extender: Stacey [name] in Treatment: 5 Visit Information History Since Last Visit Added or deleted any medications: [name] Patient Arrived: Stacey [name] Any new allergies or adverse reactions: [name] Arrival Time: 09:57 Had a fall or experienced change in [name] Accompanied By: self activities of daily living that may affect Transfer Assistance: None risk of falls: Patient Identification Verified: Yes Signs or symptoms of abuse/neglect since last visito [name] Secondary Verification Process Completed: Yes Stacey [name] Patient Requires Transmission-Based Precautions: [name] Implantable device outside of the clinic excluding [name] cellular tissue based products placed in the center since last visit: Has Dressing in Place as Prescribed: Yes Pain Present Now: [name] Electronic Signature(s) Signed: 12/23/2021 8:46:54 AM By: Stacey [name] Entered By: Stacey [name] on 11/21/2021 09:59:33 -------------------------------------------------------------------------------- Encounter Discharge Information Details Patient Name: Date of Service: Stacey [name], Stacey [name]. 11/21/2021 9:30 [name] Medical Record Number: [id number] Patient Account Number: [id number] Date of Birth/Sex: Treating RN: [birth year] (81 y.o. Stacey [name] Primary Care Provider: Shon [name] Other Clinician: Referring Provider: Treating Provider/Extender: Stacey [name] in Treatment: 5 Encounter Discharge Information Items Post Procedure Vitals Discharge Condition: Stable Temperature (F): 98.2 Ambulatory Status:  Cane Pulse (bpm): 78 Discharge Destination: Home Respiratory Rate (breaths/min): 18 Transportation: Private Auto Blood Pressure (mmHg): 143/70 Schedule Follow-up Appointment: Yes Clinical Summary of Care: Provided on 11/21/2021 Form Type Recipient Paper Patient Patient Electronic Signature(s) Signed: 11/24/2021 5:00:45 PM By: Stacey [name] Entered By: Stacey [name] on 11/21/2021 11:15:52 -------------------------------------------------------------------------------- Lower Extremity Assessment Details Patient Name: Date of Service: Stacey [name], Stacey [name]. 11/21/2021 9:30 [name] Medical Record Number: [id number] Patient Account Number: [id number] Date of Birth/Sex: Treating RN: [birth year] (81 y.o. Stacey [name] Primary Care Provider: Shon [name] Other Clinician: Referring Provider: Treating Provider/Extender: Stacey [name] in Treatment: 5 Edema Assessment Assessed: Stacey [name]: Yes] Stacey [name]: [name]] Edema: [Left: Ye] [Right: s] Calf Left: Right: Point of Measurement: 32 cm From Medial Instep 30.4 cm Ankle Left: Right: Point of Measurement: 11 cm From Medial Instep 19 cm Vascular Assessment Pulses: Dorsalis Pedis Palpable: [Left:Yes] Electronic Signature(s) Signed: 11/24/2021 5:00:45 PM By: Stacey [name] Entered By: Stacey [name] on 11/21/2021 10:05:46 -------------------------------------------------------------------------------- Multi Wound Chart Details Patient Name: Date of Service: Stacey [name], Stacey [name]. 11/21/2021 9:30 [name] Medical Record Number: [id number] Patient Account Number: [id number] Date of Birth/Sex: Treating RN: [birth year] (81 y.o. Stacey [name] Primary Care Provider: Shon [name] Other Clinician: Referring Provider: Treating Provider/Extender: Stacey [name] in Treatment: 5 Vital Signs Height(in): 73 Pulse(bpm): 67 Weight(lbs): 133 Blood Pressure(mmHg): 143/70 Body Mass Index(BMI): 22.1 Temperature(F):  98.2 Respiratory Rate(breaths/min): 18 Photos: [4:Left, Anterior Lower Leg] [N/A:N/A N/A] Wound Location: [4:Trauma] [N/A:N/A] Wounding Event: [4:Trauma, Other] [N/A:N/A] Primary Etiology: [4:Rheumatoid Arthritis] [N/A:N/A] Comorbid History: [4:10/08/2021] [N/A:N/A] Date Acquired: [4:5] [N/A:N/A] Weeks of Treatment: [4:Open] [N/A:N/A] Wound Status: [4:[name]] [N/A:N/A] Wound Recurrence: [4:7x1.6x0.2] [N/A:N/A] Measurements L x W x D (cm) [4:8.796] [N/A:N/A] A (cm) : rea [4:1.759] [N/A:N/A] Volume (cm) : [4:78.30%] [N/A:N/A] % Reduction in A rea: [4:78.30%] [N/A:N/A] % Reduction in Volume: [4:Full Thickness Without Exposed] [N/A:N/A] Classification: [4:Support Structures Medium] [N/A:N/A] Exudate A mount: [4:Serosanguineous] [N/A:N/A] Exudate Type: [4:red, brown] [N/A:N/A] Exudate  Color: [4:Distinct, outline attached] [N/A:N/A] Wound Margin: [4:Medium (34-66%)] [N/A:N/A] Granulation A mount: [4:Red] [N/A:N/A] Granulation Quality: [4:Medium (34-66%)] [N/A:N/A] Necrotic A mount: [4:Fat Layer (Subcutaneous Tissue): Yes N/A] Exposed Structures: [4:Fascia: [name] Tendon: [name] Muscle: [name] Joint: [name] Bone: [name] Medium (34-66%)] [N/A:N/A] Epithelialization: [4:Debridement - Excisional] [N/A:N/A] Debridement: Pre-procedure Verification/Time Out 10:51 [N/A:N/A] Taken: [4:Other] [N/A:N/A] Pain Control: [4:Subcutaneous, Slough] [N/A:N/A] Tissue Debrided: [4:Skin/Subcutaneous Tissue] [N/A:N/A] Level: [4:11.2] [N/A:N/A] Debridement A (sq cm): [4:rea Curette] [N/A:N/A] Instrument: [4:Minimum] [N/A:N/A] Bleeding: [4:Pressure] [N/A:N/A] Hemostasis A chieved: [4:Procedure was tolerated well] [N/A:N/A] Debridement Treatment Response: [4:7x1.6x0.2] [N/A:N/A] Post Debridement Measurements L x W x D (cm) [4:1.759] [N/A:N/A] Post Debridement Volume: (cm) [4:Debridement] [N/A:N/A] Treatment Notes Wound #4 (Lower Leg) Wound Laterality: Left, Anterior Cleanser Soap and Water Discharge Instruction: May shower  and wash wound with dial antibacterial soap and water prior to dressing change. Wound Cleanser Discharge Instruction: Cleanse the wound with wound cleanser prior to applying a clean dressing using gauze sponges, not tissue or cotton balls. Peri-Wound Care Topical Gentamicin Discharge Instruction: apply to wound bed at dressing change Primary Dressing Hydrofera Blue Ready Foam, 4x5 in Discharge Instruction: Apply to wound bed as instructed Secondary Dressing Woven Gauze Sponge, Non-Sterile 4x4 in Discharge Instruction: Apply over primary dressing as directed. Zetuvit Plus 4x8 in Discharge Instruction: Apply over primary dressing as directed. Secured With Compression Wrap Kerlix Roll 4.5x3.1 (in/yd) Discharge Instruction: Apply Kerlix and Coban compression as directed. Coban Self-Adherent Wrap 4x5 (in/yd) Discharge Instruction: Apply over Kerlix as directed. Compression Stockings Add-Ons Electronic Signature(s) Signed: 11/24/2021 12:33:58 PM By: Kalman Shan DO Signed: 11/24/2021 5:00:45 PM By: Fara Chute By: Kalman Shan on 11/24/2021 11:25:59 -------------------------------------------------------------------------------- Multi-Disciplinary Care Plan Details Patient Name: Date of Service: [place], [place] RET [name]. 11/21/2021 9:30 [name] Medical Record Number: [id number] Patient Account Number: [id number] Date of Birth/Sex: Treating RN: 07-May-1941 (81 y.o. Stacey [name] Primary Care Provider: Shon [name] Other Clinician: Referring Provider: Treating Provider/Extender: Stacey [name] in Treatment: 5 Active Inactive Wound/Skin Impairment Nursing Diagnoses: Impaired tissue integrity Goals: Ulcer/skin breakdown will have a volume reduction of 30% by week 4 Date Initiated: 10/16/2021 Date Inactivated: 11/21/2021 Target Resolution Date: 11/13/2021 Goal Status: Met Ulcer/skin breakdown will have a volume reduction of 50% by week 8 Date  Initiated: 11/21/2021 Target Resolution Date: 12/11/2021 Goal Status: Active Interventions: Assess patient/caregiver ability to obtain necessary supplies Provide education on ulcer and skin care Notes: Electronic Signature(s) Signed: 11/24/2021 5:00:45 PM By: Stacey [name] Entered By: Stacey [name] on 11/21/2021 10:16:59 -------------------------------------------------------------------------------- Pain Assessment Details Patient Name: Date of Service: [place] RET [name]. 11/21/2021 9:30 [name] Medical Record Number: [id number] Patient Account Number: [id number] Date of Birth/Sex: Treating RN: 1941/03/06 (81 y.o. Stacey [name] Primary Care Provider: Shon [name] Other Clinician: Referring Provider: Treating Provider/Extender: Stacey [name] in Treatment: 5 Active Problems Location of Pain Severity and Description of Pain Patient Has Paino [name] Patient Has Paino [name] Site Locations Pain Management and Medication Current Pain Management: Electronic Signature(s) Signed: 11/24/2021 5:00:45 PM By: Stacey [name] Signed: 12/23/2021 8:46:54 AM By: Stacey [name] Entered By: Stacey [name] on 11/21/2021 09:59:59 -------------------------------------------------------------------------------- Patient/Caregiver Education Details Patient Name: Date of Service: Moses Manners RET [name]. 5/12/2023andnbsp9:30 [name] Medical Record Number: [id number] Patient Account Number: [id number] Date of Birth/Gender: Treating RN: 02/27/1941 (81 y.o. Stacey [name] Primary Care Physician: Stacey [name] Other Clinician: Referring Physician: Treating Physician/Extender: Stacey [name] in Treatment: 5 Education Assessment Education Provided To: Patient Education Topics Provided  Venous: Methods: Explain/Verbal, Printed Responses: State content correctly Wound Debridement: Methods: Explain/Verbal Responses: State content correctly Wound/Skin Impairment: Methods:  Explain/Verbal, Printed Responses: State content correctly Electronic Signature(s) Signed: 11/24/2021 5:00:45 PM By: Stacey [name] Entered By: Stacey [name] on 11/21/2021 11:03:37 -------------------------------------------------------------------------------- Wound Assessment Details Patient Name: Date of Service: Stacey [name], [place] RET [name]. 11/21/2021 9:30 [name] Medical Record Number: [id number] Patient Account Number: [id number] Date of Birth/Sex: Treating RN: 12-11-1940 (81 y.o. Stacey [name] Primary Care Provider: Shon [name] Other Clinician: Referring Provider: Treating Provider/Extender: Stacey [name] in Treatment: 5 Wound Status Wound Number: 4 Primary Etiology: Trauma, Other Wound Location: Left, Anterior Lower Leg Wound Status: Open Wounding Event: Trauma Comorbid History: Rheumatoid Arthritis Date Acquired: 10/08/2021 Weeks Of Treatment: 5 Clustered Wound: [name] Photos Wound Measurements Length: (cm) 7 Width: (cm) 1.6 Depth: (cm) 0.2 Area: (cm) 8.796 Volume: (cm) 1.759 % Reduction in Area: 78.3% % Reduction in Volume: 78.3% Epithelialization: Medium (34-66%) Tunneling: [name] Undermining: [name] Wound Description Classification: Full Thickness Without Exposed Support Structures Wound Margin: Distinct, outline attached Exudate Amount: Medium Exudate Type: Serosanguineous Exudate Color: red, brown Foul Odor After Cleansing: [name] Slough/Fibrino Yes Wound Bed Granulation Amount: Medium (34-66%) Exposed Structure Granulation Quality: Red Fascia Exposed: [name] Necrotic Amount: Medium (34-66%) Fat Layer (Subcutaneous Tissue) Exposed: Yes Necrotic Quality: Adherent Slough Tendon Exposed: [name] Muscle Exposed: [name] Joint Exposed: [name] Bone Exposed: [name] Electronic Signature(s) Signed: 11/24/2021 5:00:45 PM By: Stacey [name] Entered By: Stacey [name] on 11/21/2021 10:09:01 -------------------------------------------------------------------------------- Vitals  Details Patient Name: Date of Service: Stacey [name], Stacey [name]. 11/21/2021 9:30 [name] Medical Record Number: [id number] Patient Account Number: [id number] Date of Birth/Sex: Treating RN: 1940/09/21 (81 y.o. Stacey [name] Primary Care Provider: Other Clinician: Shon [name] Referring Provider: Treating Provider/Extender: Stacey [name] in Treatment: 5 Vital Signs Time Taken: 09:59 Temperature (F): 98.2 Height (in): 65 Pulse (bpm): 78 Weight (lbs): 133 Respiratory Rate (breaths/min): 18 Body Mass Index (BMI): 22.1 Blood Pressure (mmHg): 143/70 Reference Range: 80 - 120 mg / dl Electronic Signature(s) Signed: 12/23/2021 8:46:54 AM By: Stacey [name] Entered By: Stacey [name] on 11/21/2021 09:59:52

## 2021-12-23 NOTE — Telephone Encounter (Signed)
Received fax from [place] stating patient's Ofev 100 mg capsules will ship to patent's home on 12/22/21  Knox Saliva, PharmD, MPH, BCPS, CPP Clinical Pharmacist (Rheumatology and Pulmonology)

## 2021-12-23 NOTE — Progress Notes (Signed)
Stacey [name], Stacey [name] ([id number]) Visit Report for 12/18/2021 Arrival Information Details Patient Name: Date of Service: Stacey [name], [name] 12/18/2021 2:30 PM Medical Record Number: [id number] Patient Account Number: [id number] Date of Birth/Sex: Treating RN: 03-Jun-1941 (81 y.o. Stacey [name], Stacey [name] Primary Care Stacey [name]: Stacey [name] Other Clinician: Referring Stacey [name]: Treating Stacey [name]/Extender: Stacey [name] in Treatment: 9 Visit Information History Since Last Visit Added or deleted any medications: No Patient Arrived: Stacey [name] Any new allergies or adverse reactions: No Arrival Time: 14:36 Had a fall or experienced change in No Accompanied By: self activities of daily living that may affect Transfer Assistance: None risk of falls: Patient Identification Verified: Yes Signs or symptoms of abuse/neglect since last visito No Secondary Verification Process Completed: Yes Hospitalized since last visit: No Patient Requires Transmission-Based Precautions: No Implantable device outside of the clinic excluding No cellular tissue based products placed in the center since last visit: Has Dressing in Place as Prescribed: Yes Has Compression in Place as Prescribed: Yes Pain Present Now: No Electronic Signature(s) Signed: 12/23/2021 8:46:10 AM By: Erenest Blank Entered By: Erenest Blank on 12/18/2021 14:38:45 -------------------------------------------------------------------------------- Clinic Level of Care Assessment Details Patient Name: Date of Service: [place], [place] RET M. 12/18/2021 2:30 PM Medical Record Number: [id number] Patient Account Number: [id number] Date of Birth/Sex: Treating RN: [birth year] (81 y.o. Stacey [name], Stacey [name] Primary Care Stacey [name]: Stacey [name] Other Clinician: Referring Stacey [name]: Treating Stacey [name]/Extender: Stacey [name] in Treatment: 9 Clinic Level of Care Assessment Items TOOL 4 Quantity Score X- 1 0 Use when only an  EandM is performed on FOLLOW-UP visit ASSESSMENTS - Nursing Assessment / Reassessment X- 1 10 Reassessment of Co-morbidities (includes updates in patient status) X- 1 5 Reassessment of Adherence to Treatment Plan ASSESSMENTS - Wound and Skin A ssessment / Reassessment X - Simple Wound Assessment / Reassessment - one wound 1 5 '[]'  - 0 Complex Wound Assessment / Reassessment - multiple wounds X- 1 10 Dermatologic / Skin Assessment (not related to wound area) ASSESSMENTS - Focused Assessment X- 1 5 Circumferential Edema Measurements - multi extremities X- 1 10 Nutritional Assessment / Counseling / Intervention '[]'  - 0 Lower Extremity Assessment (monofilament, tuning fork, pulses) '[]'  - 0 Peripheral Arterial Disease Assessment (using hand held doppler) ASSESSMENTS - Ostomy and/or Continence Assessment and Care '[]'  - 0 Incontinence Assessment and Management '[]'  - 0 Ostomy Care Assessment and Management (repouching, etc.) PROCESS - Coordination of Care X - Simple Patient / Family Education for ongoing care 1 15 '[]'  - 0 Complex (extensive) Patient / Family Education for ongoing care X- 1 10 Staff obtains Programmer, systems, Records, T Results / Process Orders est '[]'  - 0 Staff telephones HHA, Nursing Homes / Clarify orders / etc '[]'  - 0 Routine Transfer to another Facility (non-emergent condition) '[]'  - 0 Routine Hospital Admission (non-emergent condition) '[]'  - 0 New Admissions / Biomedical engineer / Ordering NPWT Apligraf, etc. , '[]'  - 0 Emergency Hospital Admission (emergent condition) X- 1 10 Simple Discharge Coordination '[]'  - 0 Complex (extensive) Discharge Coordination PROCESS - Special Needs '[]'  - 0 Pediatric / Minor Patient Management '[]'  - 0 Isolation Patient Management '[]'  - 0 Hearing / Language / Visual special needs '[]'  - 0 Assessment of Community assistance (transportation, D/C planning, etc.) '[]'  - 0 Additional assistance / Altered mentation '[]'  - 0 Support Surface(s)  Assessment (bed, cushion, seat, etc.) INTERVENTIONS - Wound Cleansing / Measurement X - Simple Wound Cleansing - one wound 1 5 '[]'  - 0 Complex Wound Cleansing -  multiple wounds X- 1 5 Wound Imaging (photographs - any number of wounds) '[]'  - 0 Wound Tracing (instead of photographs) X- 1 5 Simple Wound Measurement - one wound '[]'  - 0 Complex Wound Measurement - multiple wounds INTERVENTIONS - Wound Dressings '[]'  - 0 Small Wound Dressing one or multiple wounds '[]'  - 0 Medium Wound Dressing one or multiple wounds X- 1 20 Large Wound Dressing one or multiple wounds '[]'  - 0 Application of Medications - topical '[]'  - 0 Application of Medications - injection INTERVENTIONS - Miscellaneous '[]'  - 0 External ear exam '[]'  - 0 Specimen Collection (cultures, biopsies, blood, body fluids, etc.) '[]'  - 0 Specimen(s) / Culture(s) sent or taken to Lab for analysis '[]'  - 0 Patient Transfer (multiple staff / Civil Service fast streamer / Similar devices) '[]'  - 0 Simple Staple / Suture removal (25 or less) '[]'  - 0 Complex Staple / Suture removal (26 or more) '[]'  - 0 Hypo / Hyperglycemic Management (close monitor of Blood Glucose) '[]'  - 0 Ankle / Brachial Index (ABI) - do not check if billed separately X- 1 5 Vital Signs Has the patient been seen at the hospital within the last three years: Yes Total Score: 120 Level Of Care: New/Established - Level 4 Electronic Signature(s) Signed: 12/18/2021 4:29:12 PM By: Deon Pilling RN, BSN Entered By: Deon Pilling on 12/18/2021 15:10:30 -------------------------------------------------------------------------------- Encounter Discharge Information Details Patient Name: Date of Service: Stacey [name], Stacey [name] RET M. 12/18/2021 2:30 PM Medical Record Number: [id number] Patient Account Number: [id number] Date of Birth/Sex: Treating RN: 1940-07-16 (81 y.o. Stacey [name], Stacey [name] Primary Care Shanell Aden: Stacey [name] Other Clinician: Referring Stacey [name]: Treating Stacey [name]/Extender: Stacey [name] in Treatment: 9 Encounter Discharge Information Items Discharge Condition: Stable Ambulatory Status: Ambulatory Discharge Destination: Home Transportation: Private Auto Accompanied By: self Schedule Follow-up Appointment: Yes Clinical Summary of Care: Electronic Signature(s) Signed: 12/18/2021 4:29:12 PM By: Deon Pilling RN, BSN Entered By: Deon Pilling on 12/18/2021 15:10:54 -------------------------------------------------------------------------------- Lower Extremity Assessment Details Patient Name: Date of Service: [place], [place] RET M. 12/18/2021 2:30 PM Medical Record Number: [id number] Patient Account Number: [id number] Date of Birth/Sex: Treating RN: March 12, 1941 (81 y.o. Stacey [name], Stacey [name] Primary Care Imani Sherrin: Stacey [name] Other Clinician: Referring Julie-Ann Vanmaanen: Treating Francisca Langenderfer/Extender: Stacey [name] in Treatment: 9 Edema Assessment Assessed: Stacey [name]: No] Stacey [name]: No] Edema: [Left: Ye] [Right: s] Calf Left: Right: Point of Measurement: 32 cm From Medial Instep 32.5 cm Ankle Left: Right: Point of Measurement: 11 cm From Medial Instep 19.4 cm Vascular Assessment Pulses: Dorsalis Pedis Palpable: [Left:Yes] Electronic Signature(s) Signed: 12/18/2021 4:55:17 PM By: Rhae Hammock RN Signed: 12/23/2021 8:46:10 AM By: Erenest Blank Entered By: Erenest Blank on 12/18/2021 14:56:47 -------------------------------------------------------------------------------- Multi Wound Chart Details Patient Name: Date of Service: [place], Stacey [name] RET M. 12/18/2021 2:30 PM Medical Record Number: [id number] Patient Account Number: [id number] Date of Birth/Sex: Treating RN: [birth year] (81 y.o. Stacey [name], Stacey [name] Primary Care Isamar Wellbrock: Stacey [name] Other Clinician: Referring Stuti Sandin: Treating Philis Doke/Extender: Stacey [name] in Treatment: 9 Vital Signs Height(in): 65 Pulse(bpm): 16 Weight(lbs): 133 Blood  Pressure(mmHg): 160/63 Body Mass Index(BMI): 22.1 Temperature(F): 97.8 Respiratory Rate(breaths/min): 18 Photos: [N/A:N/A] Left, Anterior Lower Leg N/A N/A Wound Location: Trauma N/A N/A Wounding Event: Trauma, Other N/A N/A Primary Etiology: Rheumatoid Arthritis N/A N/A Comorbid History: 10/08/2021 N/A N/A Date Acquired: 9 N/A N/A Weeks of Treatment: Open N/A N/A Wound Status: No N/A N/A Wound Recurrence: 1.8x0.6x0.2 N/A N/A Measurements L x W x D (cm) 0.848 N/A N/A A (cm) : rea  0.17 N/A N/A Volume (cm) : 97.90% N/A N/A % Reduction in A rea: 97.90% N/A N/A % Reduction in Volume: Full Thickness Without Exposed N/A N/A Classification: Support Structures Medium N/A N/A Exudate Amount: Serosanguineous N/A N/A Exudate Type: red, brown N/A N/A Exudate Color: Distinct, outline attached N/A N/A Wound Margin: Large (67-100%) N/A N/A Granulation Amount: Red, Pink N/A N/A Granulation Quality: Small (1-33%) N/A N/A Necrotic Amount: Fat Layer (Subcutaneous Tissue): Yes N/A N/A Exposed Structures: Fascia: No Tendon: No Muscle: No Joint: No Bone: No Medium (34-66%) N/A N/A Epithelialization: Treatment Notes Wound #4 (Lower Leg) Wound Laterality: Left, Anterior Cleanser Soap and Water Discharge Instruction: May shower and wash wound with dial antibacterial soap and water prior to dressing change. Wound Cleanser Discharge Instruction: Cleanse the wound with wound cleanser prior to applying a clean dressing using gauze sponges, not tissue or cotton balls. Peri-Wound Care Topical Gentamicin Discharge Instruction: apply to wound bed at dressing change Primary Dressing Hydrofera Blue Ready Foam, 4x5 in Discharge Instruction: Apply to wound bed as instructed Secondary Dressing Woven Gauze Sponge, Non-Sterile 4x4 in Discharge Instruction: Apply over primary dressing as directed. Secured With Compression Wrap Kerlix Roll 4.5x3.1 (in/yd) Discharge Instruction:  Apply Kerlix and Coban compression as directed. Coban Self-Adherent Wrap 4x5 (in/yd) Discharge Instruction: Apply over Kerlix as directed. Compression Stockings Add-Ons Electronic Signature(s) Signed: 12/18/2021 4:23:09 PM By: Linton Ham MD Signed: 12/18/2021 4:55:17 PM By: Rhae Hammock RN Entered By: Linton Ham on 12/18/2021 15:11:07 -------------------------------------------------------------------------------- Multi-Disciplinary Care Plan Details Patient Name: Date of Service: [place], [place] RET M. 12/18/2021 2:30 PM Medical Record Number: [id number] Patient Account Number: [id number] Date of Birth/Sex: Treating RN: [birth year] (81 y.o. Stacey [name] Primary Care Melanny Wire: Stacey [name] Other Clinician: Referring Dartanyan Deasis: Treating Jaeliana Lococo/Extender: Stacey [name] in Treatment: 9 Active Inactive Wound/Skin Impairment Nursing Diagnoses: Impaired tissue integrity Goals: Ulcer/skin breakdown will have a volume reduction of 30% by week 4 Date Initiated: 10/16/2021 Date Inactivated: 11/21/2021 Target Resolution Date: 11/13/2021 Goal Status: Met Ulcer/skin breakdown will have a volume reduction of 50% by week 8 Date Initiated: 11/21/2021 Target Resolution Date: 01/10/2022 Goal Status: Active Interventions: Assess patient/caregiver ability to obtain necessary supplies Provide education on ulcer and skin care Notes: Electronic Signature(s) Signed: 12/18/2021 4:46:06 PM By: Lorrin Jackson Entered By: Lorrin Jackson on 12/18/2021 15:02:49 -------------------------------------------------------------------------------- Pain Assessment Details Patient Name: Date of Service: Stacey [name], Stacey [name] RET M. 12/18/2021 2:30 PM Medical Record Number: [id number] Patient Account Number: [id number] Date of Birth/Sex: Treating RN: [birth year] (81 y.o. Stacey [name], Stacey [name] Primary Care Louretta Tantillo: Stacey [name] Other Clinician: Referring Duc Crocket: Treating Bufford Helms/Extender:  Stacey [name] in Treatment: 9 Active Problems Location of Pain Severity and Description of Pain Patient Has Paino No Site Locations Pain Management and Medication Current Pain Management: Electronic Signature(s) Signed: 12/18/2021 4:55:17 PM By: Rhae Hammock RN Signed: 12/23/2021 8:46:10 AM By: Erenest Blank Entered By: Erenest Blank on 12/18/2021 14:43:08 -------------------------------------------------------------------------------- Patient/Caregiver Education Details Patient Name: Date of Service: [place] RET M. 6/8/2023andnbsp2:30 PM Medical Record Number: [id number] Patient Account Number: [id number] Date of Birth/Gender: Treating RN: 02/25/1941 (81 y.o. Stacey [name] Primary Care Physician: Stacey [name] Other Clinician: Referring Physician: Treating Physician/Extender: Stacey [name] in Treatment: 9 Education Assessment Education Provided To: Patient Education Topics Provided Venous: Methods: Explain/Verbal, Printed Responses: State content correctly Wound/Skin Impairment: Methods: Explain/Verbal, Printed Responses: State content correctly Electronic Signature(s) Signed: 12/18/2021 4:46:06 PM By: Lorrin Jackson Entered By: Lorrin Jackson on 12/18/2021 15:03:10 -------------------------------------------------------------------------------- Wound Assessment  Details Patient Name: Date of Service: [place], [name] 12/18/2021 2:30 PM Medical Record Number: [id number] Patient Account Number: [id number] Date of Birth/Sex: Treating RN: 20-Mar-1941 (81 y.o. Stacey [name], Stacey [name] Primary Care Hodge Stachnik: Stacey [name] Other Clinician: Referring Anden Bartolo: Treating Tykerria Mccubbins/Extender: Stacey [name] in Treatment: 9 Wound Status Wound Number: 4 Primary Etiology: Trauma, Other Wound Location: Left, Anterior Lower Leg Wound Status: Open Wounding Event: Trauma Comorbid History: Rheumatoid  Arthritis Date Acquired: 10/08/2021 Weeks Of Treatment: 9 Clustered Wound: No Photos Wound Measurements Length: (cm) 1.8 Width: (cm) 0.6 Depth: (cm) 0.2 Area: (cm) 0.848 Volume: (cm) 0.17 % Reduction in Area: 97.9% % Reduction in Volume: 97.9% Epithelialization: Medium (34-66%) Tunneling: No Undermining: No Wound Description Classification: Full Thickness Without Exposed Support Structures Wound Margin: Distinct, outline attached Exudate Amount: Medium Exudate Type: Serosanguineous Exudate Color: red, brown Wound Bed Granulation Amount: Large (67-100%) Granulation Quality: Red, Pink Necrotic Amount: Small (1-33%) Necrotic Quality: Adherent Slough Foul Odor After Cleansing: No Slough/Fibrino Yes Exposed Structure Fascia Exposed: No Fat Layer (Subcutaneous Tissue) Exposed: Yes Tendon Exposed: No Muscle Exposed: No Joint Exposed: No Bone Exposed: No Treatment Notes Wound #4 (Lower Leg) Wound Laterality: Left, Anterior Cleanser Soap and Water Discharge Instruction: May shower and wash wound with dial antibacterial soap and water prior to dressing change. Wound Cleanser Discharge Instruction: Cleanse the wound with wound cleanser prior to applying a clean dressing using gauze sponges, not tissue or cotton balls. Peri-Wound Care Topical Gentamicin Discharge Instruction: apply to wound bed at dressing change Primary Dressing Hydrofera Blue Ready Foam, 4x5 in Discharge Instruction: Apply to wound bed as instructed Secondary Dressing Woven Gauze Sponge, Non-Sterile 4x4 in Discharge Instruction: Apply over primary dressing as directed. Secured With Compression Wrap Kerlix Roll 4.5x3.1 (in/yd) Discharge Instruction: Apply Kerlix and Coban compression as directed. Coban Self-Adherent Wrap 4x5 (in/yd) Discharge Instruction: Apply over Kerlix as directed. Compression Stockings Add-Ons Electronic Signature(s) Signed: 12/18/2021 4:55:17 PM By: Rhae Hammock  RN Signed: 12/23/2021 8:46:10 AM By: Erenest Blank Entered By: Erenest Blank on 12/18/2021 14:58:34 -------------------------------------------------------------------------------- Vitals Details Patient Name: Date of Service: Stacey [name], [place] RET M. 12/18/2021 2:30 PM Medical Record Number: [id number] Patient Account Number: [id number] Date of Birth/Sex: Treating RN: 08/14/1940 (81 y.o. Stacey [name], Stacey [name] Primary Care Jayshun Galentine: Stacey [name] Other Clinician: Referring Alastor Kneale: Treating Ebany Bowermaster/Extender: Stacey [name] in Treatment: 9 Vital Signs Time Taken: 14:38 Temperature (F): 97.8 Height (in): 65 Pulse (bpm): 76 Weight (lbs): 133 Respiratory Rate (breaths/min): 18 Body Mass Index (BMI): 22.1 Blood Pressure (mmHg): 160/63 Reference Range: 80 - 120 mg / dl Electronic Signature(s) Signed: 12/23/2021 8:46:10 AM By: Erenest Blank Entered By: Erenest Blank on 12/18/2021 14:41:06

## 2021-12-24 DIAGNOSIS — S42002D Fracture of unspecified part of left clavicle, subsequent encounter for fracture with routine healing: Secondary | ICD-10-CM | POA: Diagnosis not present

## 2021-12-25 ENCOUNTER — Encounter (HOSPITAL_BASED_OUTPATIENT_CLINIC_OR_DEPARTMENT_OTHER): Payer: Medicare Other | Admitting: Internal Medicine

## 2021-12-25 DIAGNOSIS — T798XXA Other early complications of trauma, initial encounter: Secondary | ICD-10-CM

## 2021-12-25 DIAGNOSIS — L97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 with unspecified severity: Secondary | ICD-10-CM | POA: Diagnosis not present

## 2021-12-25 DIAGNOSIS — S80812A Abrasion, left lower leg, initial encounter: Secondary | ICD-10-CM | POA: Diagnosis not present

## 2021-12-25 DIAGNOSIS — M069 Rheumatoid arthritis, unspecified: Secondary | ICD-10-CM | POA: Diagnosis not present

## 2021-12-25 DIAGNOSIS — S7002XA Contusion of left hip, initial encounter: Secondary | ICD-10-CM | POA: Diagnosis not present

## 2021-12-25 DIAGNOSIS — J849 Interstitial pulmonary disease, unspecified: Secondary | ICD-10-CM | POA: Diagnosis not present

## 2021-12-25 NOTE — Progress Notes (Signed)
Stacey [name], Stacey [name] ([id number]) Visit Report for 12/25/2021 Chief Complaint Document Details Patient Name: Date of Service: [place], [place] [facility] RET M. 12/25/2021 2:30 PM Medical Record Number: [id number] Patient Account Number: [id number] Date of Birth/Sex: Treating RN: Mar 06, 1941 (81 y.o. [name], [name] Primary Care Provider: Shon Baton Other Clinician: Referring Provider: Treating Provider/Extender: Cherre Huger in Treatment: 10 Information Obtained from: Patient Chief Complaint 10/16/2021; left lower extremity wounds following trauma Electronic Signature(s) Signed: 12/25/2021 4:44:12 PM By: Kalman Shan DO Entered By: Kalman Shan on 12/25/2021 16:39:08 -------------------------------------------------------------------------------- Debridement Details Patient Name: Date of Service: Stacey [name] RET M. 12/25/2021 2:30 PM Medical Record Number: [id number] Patient Account Number: [id number] Date of Birth/Sex: Treating RN: [birth year] (81 y.o. [name], [name] Primary Care Provider: Shon Baton Other Clinician: Referring Provider: Treating Provider/Extender: Cherre Huger in Treatment: 10 Debridement Performed for Assessment: Wound #4 Left,Anterior Lower Leg Performed By: Physician Kalman Shan, DO Debridement Type: Debridement Level of Consciousness (Pre-procedure): Awake and Alert Pre-procedure Verification/Time Out Yes - 15:00 Taken: Start Time: 15:00 Pain Control: Lidocaine T Area Debrided (L x W): otal 0.7 (cm) x 0.3 (cm) = 0.21 (cm) Tissue and other material debrided: Viable, Non-Viable, Slough, Subcutaneous, Slough Level: Skin/Subcutaneous Tissue Debridement Description: Excisional Instrument: Curette Bleeding: Minimum Hemostasis Achieved: Pressure End Time: 15:00 Procedural Pain: 0 Post Procedural Pain: 0 Response to Treatment: Procedure was tolerated well Level of Consciousness (Post- Awake and  Alert procedure): Post Debridement Measurements of Total Wound Length: (cm) 0.7 Width: (cm) 0.3 Depth: (cm) 0.2 Volume: (cm) 0.033 Character of Wound/Ulcer Post Debridement: Improved Post Procedure Diagnosis Same as Pre-procedure Electronic Signature(s) Signed: 12/25/2021 4:44:12 PM By: Kalman Shan DO Signed: 12/25/2021 5:29:07 PM By: Rhae Hammock RN Entered By: Rhae Hammock on 12/25/2021 15:19:35 -------------------------------------------------------------------------------- HPI Details Patient Name: Date of Service: Stacey [name] RET M. 12/25/2021 2:30 PM Medical Record Number: [id number] Patient Account Number: [id number] Date of Birth/Sex: Treating RN: [birth year] (81 y.o. [name], [name] Primary Care Provider: Shon Baton Other Clinician: Referring Provider: Treating Provider/Extender: Cherre Huger in Treatment: 10 History of Present Illness HPI Description: Admission 10/16/2021 [name] is an 81 year old female with a past medical history of rheumatoid arthritis, interstitial lung disease that presents to the clinic for a 1 week history of wounds to her left lower extremity following a fall. She visited the ED following the trauma. She had 25 sutures placed to the left lower extremity wound. She also reports a hematoma to the left lateral thigh with an abrasion mark. She was started on doxycycline in the ED and she completed this. She currently denies signs of infection. 4/13; patient admitted the clinic last week after a fall. She had a fairly sizable laceration on the left lower leg that was sutured in the ER. Sutures are still in place. Also problematic she has a large hematoma over the left lateral hip. Apparently she did not have any x-rays done when she was in the ER of this area because it only really showed up 24 to 48 hours later. We have been using gentamicin and Hydrofera Blue to both areas. 4/20; patient presents for  follow-up. She states that her orthopedic surgeon drained the hematoma to her left lateral thigh. She has no open wounds to this area. She states she tolerated the compression wrap well up to the left lower extremity. She has no issues or complaints today. She denies signs of infection. 4/28; laceration on the left lower extremity following a  fall. Since I last saw this the sutures have been removed but the wound is dehisced and the surface is necrotic. We have been using gentamicin and Hydrofera Blue She tells me that the hematoma was seen by orthopedic surgery and they drained this using a large bore needle. We did not look at this today. Finally she is going to [facility] for relatives graduation next Friday we will bring her in early for a nurse visit to change the dressing she will be back in time to be seen the week after next 5/12; patient presents for follow-up. She has been using antibiotic ointment with Hydrofera Blue to the wound bed. She denies signs of infection. She has no issues or complaints today. 5/19; patient presents for follow-up. She tolerated the compression wrap well. We have been using gentamicin and Hydrofera Blue under Kerlix/Coban. She has no complaints today. 5/25; patient presents for follow-up. We will continue to use gentamicin and Hydrofera Blue under Kerlix/Coban. She has no issues or complaints today. She denies signs of infection. 6/2; Patient presents for follow up. Patient has been using hydrofera blue under kerlix/coban. She reports improvement in wound healing. She denies signs of infection. 6/8; left lateral leg wound which was traumatic in the setting of chronic venous insufficiency. She is using topical antibiotics and Hydrofera Blue under kerlix Coban. The wound looks as though it is doing very well 6/15; patient presents for follow-up. She has no issues or complaints today. She has tolerated the compression wrap well. We have been using Hydrofera Blue and  gentamicin ointment under this. She denies signs of infection. Electronic Signature(s) Signed: 12/25/2021 4:44:12 PM By: Kalman Shan DO Entered By: Kalman Shan on 12/25/2021 16:40:40 -------------------------------------------------------------------------------- Physical Exam Details Patient Name: Date of Service: [place], Stacey [name] RET M. 12/25/2021 2:30 PM Medical Record Number: [id number] Patient Account Number: [id number] Date of Birth/Sex: Treating RN: 1941-01-11 (81 y.o. [name], [name] Primary Care Provider: Shon Baton Other Clinician: Referring Provider: Treating Provider/Extender: Cherre Huger in Treatment: 10 Constitutional respirations regular, non-labored and within target range for patient.. Cardiovascular 2+ dorsalis pedis/posterior tibialis pulses. Psychiatric pleasant and cooperative. Notes Left lower extremity: T the anterior aspect there is an open wound with nonviable tissue. Postdebridement there is healthy granulation tissue present. Venous o stasis dermatitis. Electronic Signature(s) Signed: 12/25/2021 4:44:12 PM By: Kalman Shan DO Entered By: Kalman Shan on 12/25/2021 16:42:23 -------------------------------------------------------------------------------- Physician Orders Details Patient Name: Date of Service: [place], [place] RET M. 12/25/2021 2:30 PM Medical Record Number: [id number] Patient Account Number: [id number] Date of Birth/Sex: Treating RN: 06-16-1941 (81 y.o. [name], [name] Primary Care Provider: Shon Baton Other Clinician: Referring Provider: Treating Provider/Extender: Cherre Huger in Treatment: 10 Verbal / Phone Orders: No Diagnosis Coding Follow-up Appointments ppointment in 1 week. - Friday 01/02/22 @ 1000 w/ Dr. Martie Round and Allayne Butcher Room # 9 Return A Other: - purchase support stockings- bring in weekly for when wound closes. Bathing/ Shower/ Hygiene May shower and  wash wound with soap and water. Edema Control - Lymphedema / SCD / Other Elevate legs to the level of the heart or above for 30 minutes daily and/or when sitting, a frequency of: - elevate legs when sitting Avoid standing for long periods of time. Moisturize legs daily. - keep skin soft with moisturizer, do not get moisturizer on wound bed Additional Orders / Instructions Follow Nutritious Diet - increase protein intake [facility] for wound care. Other Home Health Orders/Instructions: - [facility] Wound Treatment Wound #4 - Lower Leg Wound Laterality: Left, Anterior Cleanser: Soap and Water 1 x Per Week/15 Days Discharge Instructions: May shower and wash wound with dial antibacterial soap and water prior to dressing change. Cleanser: Wound Cleanser (Generic) 1 x Per Week/15 Days Discharge Instructions: Cleanse the wound with wound cleanser prior to applying a clean dressing using gauze sponges, not tissue or cotton balls. Topical: Gentamicin 1 x Per Week/15 Days Discharge Instructions: apply to wound bed at dressing change Prim Dressing: FIBRACOL Plus Dressing, 2x2 in (collagen) 1 x Per Week/15 Days ary Discharge Instructions: Moisten collagen with saline or hydrogel Secondary Dressing: Woven Gauze Sponge, Non-Sterile 4x4 in (Generic) 1 x Per Week/15 Days Discharge Instructions: Apply over primary dressing as directed. Compression Wrap: Kerlix Roll 4.5x3.1 (in/yd) (Generic) 1 x Per Week/15 Days Discharge Instructions: Apply Kerlix and Coban compression as directed. Compression Wrap: Coban Self-Adherent Wrap 4x5 (in/yd) (Generic) 1 x Per Week/15 Days Discharge Instructions: Apply over Kerlix as directed. Electronic Signature(s) Signed: 12/25/2021 4:44:12 PM By: Kalman Shan DO Entered By: Kalman Shan on 12/25/2021 16:42:33 -------------------------------------------------------------------------------- Problem List Details Patient Name: Date of  Service: Stacey Dy, [place] RET M. 12/25/2021 2:30 PM Medical Record Number: [id number] Patient Account Number: [id number] Date of Birth/Sex: Treating RN: Jul 30, 1940 (81 y.o. [name], [name] Primary Care Provider: Shon Baton Other Clinician: Referring Provider: Treating Provider/Extender: Cherre Huger in Treatment: 10 Active Problems ICD-10 Encounter Code Description Active Date MDM Diagnosis L97.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 with unspecified 10/16/2021 No Yes severity S80.812A Abrasion, left lower leg, initial encounter 10/16/2021 No Yes S70.02XD Contusion of left hip, subsequent encounter 10/23/2021 No Yes T79.8XXA Other early complications of trauma, initial encounter 10/16/2021 No Yes M06.9 Rheumatoid arthritis, unspecified 10/16/2021 No Yes Inactive Problems Resolved Problems Electronic Signature(s) Signed: 12/25/2021 4:44:12 PM By: Kalman Shan DO Entered By: Kalman Shan on 12/25/2021 16:37:42 -------------------------------------------------------------------------------- Progress Note Details Patient Name: Date of Service: Stacey [name] RET M. 12/25/2021 2:30 PM Medical Record Number: [id number] Patient Account Number: [id number] Date of Birth/Sex: Treating RN: 11-17-1940 (81 y.o. [name], [name] Primary Care Provider: Shon Baton Other Clinician: Referring Provider: Treating Provider/Extender: Cherre Huger in Treatment: 10 Subjective Chief Complaint Information obtained from Patient 10/16/2021; left lower extremity wounds following trauma History of Present Illness (HPI) Admission 10/16/2021 [name] is an 81 year old female with a past medical history of rheumatoid arthritis, interstitial lung disease that presents to the clinic for a 1 week history of wounds to her left lower extremity following a fall. She visited the ED following the trauma. She had 25 sutures placed to the  left lower extremity wound. She also reports a hematoma to the left lateral thigh with an abrasion mark. She was started on doxycycline in the ED and she completed this. She currently denies signs of infection. 4/13; patient admitted the clinic last week after a fall. She had a fairly sizable laceration on the left lower leg that was sutured in the ER. Sutures are still in place. Also problematic she has a large hematoma over the left lateral hip. Apparently she did not have any x-rays done when she was in the ER of this area because it only really showed up 24 to 48 hours later. We have been using gentamicin and Hydrofera Blue to both areas. 4/20; patient presents for follow-up. She states that her orthopedic surgeon drained the hematoma to her left lateral thigh. She has no open  wounds to this area. She states she tolerated the compression wrap well up to the left lower extremity. She has no issues or complaints today. She denies signs of infection. 4/28; laceration on the left lower extremity following a fall. Since I last saw this the sutures have been removed but the wound is dehisced and the surface is necrotic. We have been using gentamicin and Hydrofera Blue She tells me that the hematoma was seen by orthopedic surgery and they drained this using a large bore needle. We did not look at this today. Finally she is going to [facility] for relatives graduation next Friday we will bring her in early for a nurse visit to change the dressing she will be back in time to be seen the week after next 5/12; patient presents for follow-up. She has been using antibiotic ointment with Hydrofera Blue to the wound bed. She denies signs of infection. She has no issues or complaints today. 5/19; patient presents for follow-up. She tolerated the compression wrap well. We have been using gentamicin and Hydrofera Blue under Kerlix/Coban. She has no complaints today. 5/25; patient presents for follow-up. We will  continue to use gentamicin and Hydrofera Blue under Kerlix/Coban. She has no issues or complaints today. She denies signs of infection. 6/2; Patient presents for follow up. Patient has been using hydrofera blue under kerlix/coban. She reports improvement in wound healing. She denies signs of infection. 6/8; left lateral leg wound which was traumatic in the setting of chronic venous insufficiency. She is using topical antibiotics and Hydrofera Blue under kerlix Coban. The wound looks as though it is doing very well 6/15; patient presents for follow-up. She has no issues or complaints today. She has tolerated the compression wrap well. We have been using Hydrofera Blue and gentamicin ointment under this. She denies signs of infection. Patient History Information obtained from Patient. Family History Lung Disease - Father, No family history of Cancer, Diabetes, Heart Disease, Hypertension, Kidney Disease, Seizures, Stroke, Thyroid Problems, Tuberculosis. Social History Never smoker, Marital Status - Married, Alcohol Use - Never, Drug Use - No History, Caffeine Use - Never. Medical History Musculoskeletal Patient has history of Rheumatoid Arthritis Medical A Surgical History Notes nd Eyes occasional double vision sometimes loses balance Respiratory interstitial lung disease Objective Constitutional respirations regular, non-labored and within target range for patient.. Vitals Time Taken: 2:51 PM, Height: 65 in, Weight: 133 lbs, BMI: 22.1, Temperature: 98.4 F, Pulse: 111 bpm, Respiratory Rate: 18 breaths/min, Blood Pressure: 161/74 mmHg. Cardiovascular 2+ dorsalis pedis/posterior tibialis pulses. Psychiatric pleasant and cooperative. General Notes: Left lower extremity: T the anterior aspect there is an open wound with nonviable tissue. Postdebridement there is healthy granulation tissue o present. Venous stasis dermatitis. Integumentary (Hair, Skin) Wound #4 status is Open.  Original cause of wound was Trauma. The date acquired was: 10/08/2021. The wound has been in treatment 10 weeks. The wound is located on the Left,Anterior Lower Leg. The wound measures 0.7cm length x 0.3cm width x 0.2cm depth; 0.165cm^2 area and 0.033cm^3 volume. There is Fat Layer (Subcutaneous Tissue) exposed. There is no undermining noted. There is a medium amount of serosanguineous drainage noted. The wound margin is distinct with the outline attached to the wound base. There is large (67-100%) red, pink granulation within the wound bed. There is a small (1-33%) amount of necrotic tissue within the wound bed including Adherent Slough. Assessment Active Problems ICD-10 Non-pressure chronic ulcer of other part of left lower leg with unspecified severity Abrasion, left lower leg,  initial encounter Contusion of left hip, subsequent encounter Other early complications of trauma, initial encounter Rheumatoid arthritis, unspecified Patient's wound has shown improvement in size and appearance since last clinic visit I debrided nonviable tissue. I recommended switching the dressing to collagen and continue compression. I am hopeful she will be healed by next week. Procedures Wound #4 Pre-procedure diagnosis of Wound #4 is a Trauma, Other located on the Left,Anterior Lower Leg . There was a Excisional Skin/Subcutaneous Tissue Debridement with a total area of 0.21 sq cm performed by Kalman Shan, DO. With the following instrument(s): Curette to remove Viable and Non-Viable tissue/material. Material removed includes Subcutaneous Tissue and Slough and after achieving pain control using Lidocaine. No specimens were taken. A time out was conducted at 15:00, prior to the start of the procedure. A Minimum amount of bleeding was controlled with Pressure. The procedure was tolerated well with a pain level of 0 throughout and a pain level of 0 following the procedure. Post Debridement Measurements: 0.7cm  length x 0.3cm width x 0.2cm depth; 0.033cm^3 volume. Character of Wound/Ulcer Post Debridement is improved. Post procedure Diagnosis Wound #4: Same as Pre-Procedure Plan Follow-up Appointments: Return Appointment in 1 week. - Friday 01/02/22 @ 1000 w/ Dr. Martie Round and Allayne Butcher Room # 9 Other: - purchase support stockings- bring in weekly for when wound closes. Bathing/ Shower/ Hygiene: May shower and wash wound with soap and water. Edema Control - Lymphedema / SCD / Other: Elevate legs to the level of the heart or above for 30 minutes daily and/or when sitting, a frequency of: - elevate legs when sitting Avoid standing for long periods of time. Moisturize legs daily. - keep skin soft with moisturizer, do not get moisturizer on wound bed Additional Orders / Instructions: Follow Nutritious Diet - increase protein intake Home Health: [name]. Other Home Health Orders/Instructions: - [facility] WOUND #4: - Lower Leg Wound Laterality: Left, Anterior Cleanser: Soap and Water 1 x Per Week/15 Days Discharge Instructions: May shower and wash wound with dial antibacterial soap and water prior to dressing change. Cleanser: Wound Cleanser (Generic) 1 x Per Week/15 Days Discharge Instructions: Cleanse the wound with wound cleanser prior to applying a clean dressing using gauze sponges, not tissue or cotton balls. Topical: Gentamicin 1 x Per Week/15 Days Discharge Instructions: apply to wound bed at dressing change Prim Dressing: FIBRACOL Plus Dressing, 2x2 in (collagen) 1 x Per Week/15 Days ary Discharge Instructions: Moisten collagen with saline or hydrogel Secondary Dressing: Woven Gauze Sponge, Non-Sterile 4x4 in (Generic) 1 x Per Week/15 Days Discharge Instructions: Apply over primary dressing as directed. Com pression Wrap: Kerlix Roll 4.5x3.1 (in/yd) (Generic) 1 x Per Week/15 Days Discharge Instructions: Apply Kerlix and Coban compression as directed. Com  pression Wrap: Coban Self-Adherent Wrap 4x5 (in/yd) (Generic) 1 x Per Week/15 Days Discharge Instructions: Apply over Kerlix as directed. 1. In office sharp debridement 2. Collagen under Kerlix/Coban 3. Follow-up in 1 week Electronic Signature(s) Signed: 12/25/2021 4:44:12 PM By: Kalman Shan DO Entered By: Kalman Shan on 12/25/2021 16:43:25 -------------------------------------------------------------------------------- HxROS Details Patient Name: Date of Service: [place], [place] [name] RET M. 12/25/2021 2:30 PM Medical Record Number: [id number] Patient Account Number: [id number] Date of Birth/Sex: Treating RN: [birth year] (81 y.o. [name], [name] Primary Care Provider: Shon Baton Other Clinician: Referring Provider: Treating Provider/Extender: Cherre Huger in Treatment: 10 Information Obtained From Patient Eyes Medical History: Past Medical History Notes: occasional double vision sometimes loses balance Respiratory Medical History: Past Medical  History Notes: interstitial lung disease Musculoskeletal Medical History: Positive for: Rheumatoid Arthritis Immunizations Pneumococcal Vaccine: Received Pneumococcal Vaccination: Yes Received Pneumococcal Vaccination On or After 60th Birthday: Yes Implantable Devices None Family and Social History Cancer: No; Diabetes: No; Heart Disease: No; Hypertension: No; Kidney Disease: No; Lung Disease: Yes - Father; Seizures: No; Stroke: No; Thyroid Problems: No; Tuberculosis: No; Never smoker; Marital Status - Married; Alcohol Use: Never; Drug Use: No History; Caffeine Use: Never; Financial Concerns: No; Food, Clothing or Shelter Needs: No; Support System Lacking: No; Transportation Concerns: No Electronic Signature(s) Signed: 12/25/2021 4:44:12 PM By: Kalman Shan DO Signed: 12/25/2021 5:29:07 PM By: Rhae Hammock RN Entered By: Kalman Shan on 12/25/2021  16:40:48 -------------------------------------------------------------------------------- SuperBill Details Patient Name: Date of Service: [place], [place] RET M. 12/25/2021 Medical Record Number: [id number] Patient Account Number: [id number] Date of Birth/Sex: Treating RN: 06/29/1941 (81 y.o. [name], [name] Primary Care Provider: Shon Baton Other Clinician: Referring Provider: Treating Provider/Extender: Cherre Huger in Treatment: 10 Diagnosis Coding ICD-10 Codes Code Description [phone] Non-pressure chronic ulcer of other part of left lower leg with unspecified severity S80.812A Abrasion, left lower leg, initial encounter S70.02XD Contusion of left hip, subsequent encounter T79.8XXA Other early complications of trauma, initial encounter M06.9 Rheumatoid arthritis, unspecified Facility Procedures CPT4 Code: [id number] Description: 19622 - DEB SUBQ TISSUE 20 SQ CM/< ICD-10 Diagnosis Description L97.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 with unspecified sev Modifier: erity Quantity: 1 Physician Procedures : CPT4 Code Description Modifier [id number] - WC PHYS LEVEL 2 - EST PT ICD-10 Diagnosis Description 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

## 2021-12-26 NOTE — Progress Notes (Signed)
NAKEESHA, BOWLER ([id number]) Visit Report for 12/25/2021 Arrival Information Details Patient Name: Date of Service: [place], [name] 12/25/2021 2:30 PM Medical Record Number: [id number] Patient Account Number: [id number] Date of Birth/Sex: Treating RN: 11-May-1941 (81 y.o. [name], [name] Primary Care Carlton Sweaney: Shon Baton Other Clinician: Referring Wenceslao Loper: Treating Ardath Lepak/Extender: Cherre Huger in Treatment: 10 Visit Information History Since Last Visit Added or deleted any medications: No Patient Arrived: Ambulatory Any new allergies or adverse reactions: No Arrival Time: 14:49 Had a fall or experienced change in No Accompanied By: self activities of daily living that may affect Transfer Assistance: None risk of falls: Patient Identification Verified: Yes Signs or symptoms of abuse/neglect since last visito No Secondary Verification Process Completed: Yes Hospitalized since last visit: No Patient Requires Transmission-Based Precautions: No Has Dressing in Place as Prescribed: Yes Has Compression in Place as Prescribed: Yes Pain Present Now: No Electronic Signature(s) Signed: 12/26/2021 11:45:44 AM By: Erenest Blank Entered By: Erenest Blank on 12/25/2021 14:49:58 -------------------------------------------------------------------------------- Encounter Discharge Information Details Patient Name: Date of Service: [place], [name]. 12/25/2021 2:30 PM Medical Record Number: [id number] Patient Account Number: [id number] Date of Birth/Sex: Treating RN: 1941/02/26 (81 y.o. [name], [name] Primary Care Vanellope Passmore: Shon Baton Other Clinician: Referring Deysha Cartier: Treating Jaxsun Ciampi/Extender: Cherre Huger in Treatment: 10 Encounter Discharge Information Items Post Procedure Vitals Discharge Condition: Stable Temperature (F): 98.7 Ambulatory Status: Ambulatory Pulse (bpm): 74 Discharge Destination:  Home Respiratory Rate (breaths/min): 17 Transportation: Private Auto Blood Pressure (mmHg): 134/74 Accompanied By: self Schedule Follow-up Appointment: Yes Clinical Summary of Care: Patient Declined Electronic Signature(s) Signed: 12/25/2021 5:29:07 PM By: Rhae Hammock RN Entered By: Rhae Hammock on 12/25/2021 17:12:58 -------------------------------------------------------------------------------- Lower Extremity Assessment Details Patient Name: Date of Service: [place], [name]. 12/25/2021 2:30 PM Medical Record Number: [id number] Patient Account Number: [id number] Date of Birth/Sex: Treating RN: Apr 16, 1941 (81 y.o. [name], [name] Primary Care Earlyne Feeser: Shon Baton Other Clinician: Referring Ijeoma Loor: Treating Hind Chesler/Extender: Cherre Huger in Treatment: 10 Edema Assessment Assessed: [name]: No] [name]: No] Edema: [Left: Ye] [Right: s] Calf Left: Right: Point of Measurement: 32 cm From Medial Instep 32 cm Ankle Left: Right: Point of Measurement: 11 cm From Medial Instep 19.1 cm Vascular Assessment Pulses: Dorsalis Pedis Palpable: [Left:Yes] Electronic Signature(s) Signed: 12/25/2021 5:29:07 PM By: Rhae Hammock RN Signed: 12/26/2021 11:45:44 AM By: Erenest Blank Entered By: Erenest Blank on 12/25/2021 15:02:13 -------------------------------------------------------------------------------- Multi Wound Chart Details Patient Name: Date of Service: [place] RET M. 12/25/2021 2:30 PM Medical Record Number: [id number] Patient Account Number: [id number] Date of Birth/Sex: Treating RN: 1940/11/27 (81 y.o. [name], [name] Primary Care Randolf Sansoucie: Shon Baton Other Clinician: Referring Otis Portal: Treating Naya Ilagan/Extender: Cherre Huger in Treatment: 10 Vital Signs Height(in): 65 Pulse(bpm): 111 Weight(lbs): 133 Blood Pressure(mmHg): 161/74 Body Mass Index(BMI): 22.1 Temperature(F):  98.4 Respiratory Rate(breaths/min): 18 Photos: [4:Left, Anterior Lower Leg] [N/A:N/A N/A] Wound Location: [4:Trauma] [N/A:N/A] Wounding Event: [4:Trauma, Other] [N/A:N/A] Primary Etiology: [4:Rheumatoid Arthritis] [N/A:N/A] Comorbid History: [4:10/08/2021] [N/A:N/A] Date Acquired: [4:10] [N/A:N/A] Weeks of Treatment: [4:Open] [N/A:N/A] Wound Status: [4:No] [N/A:N/A] Wound Recurrence: [4:0.7x0.3x0.2] [N/A:N/A] Measurements L x W x D (cm) [4:0.165] [N/A:N/A] A (cm) : rea [4:0.033] [N/A:N/A] Volume (cm) : [4:99.60%] [N/A:N/A] % Reduction in A rea: [4:99.60%] [N/A:N/A] % Reduction in Volume: [4:Full Thickness Without Exposed] [N/A:N/A] Classification: [4:Support Structures Medium] [N/A:N/A] Exudate A mount: [4:Serosanguineous] [N/A:N/A] Exudate Type: [4:red, brown] [N/A:N/A] Exudate Color: [4:Distinct, outline attached] [N/A:N/A] Wound Margin: [4:Large (67-100%)] [N/A:N/A] Granulation  A mount: [4:Red, Pink] [N/A:N/A] Granulation Quality: [4:Small (1-33%)] [N/A:N/A] Necrotic A mount: [4:Fat Layer (Subcutaneous Tissue): Yes N/A] Exposed Structures: [4:Fascia: No Tendon: No Muscle: No Joint: No Bone: No Large (67-100%)] [N/A:N/A] Epithelialization: [4:Debridement - Excisional] [N/A:N/A] Debridement: Pre-procedure Verification/Time Out 15:00 [N/A:N/A] Taken: [4:Lidocaine] [N/A:N/A] Pain Control: [4:Subcutaneous, Slough] [N/A:N/A] Tissue Debrided: [4:Skin/Subcutaneous Tissue] [N/A:N/A] Level: [4:0.21] [N/A:N/A] Debridement A (sq cm): [4:rea Curette] [N/A:N/A] Instrument: [4:Minimum] [N/A:N/A] Bleeding: [4:Pressure] [N/A:N/A] Hemostasis A chieved: [4:0] [N/A:N/A] Procedural Pain: [4:0] [N/A:N/A] Post Procedural Pain: [4:Procedure was tolerated well] [N/A:N/A] Debridement Treatment Response: [4:0.7x0.3x0.2] [N/A:N/A] Post Debridement Measurements L x W x D (cm) [4:0.033] [N/A:N/A] Post Debridement Volume: (cm) [4:Debridement] [N/A:N/A] Treatment Notes Electronic  Signature(s) Signed: 12/25/2021 4:44:12 PM By: Kalman Shan DO Signed: 12/25/2021 5:29:07 PM By: Rhae Hammock RN Entered By: Kalman Shan on 12/25/2021 16:38:57 -------------------------------------------------------------------------------- Multi-Disciplinary Care Plan Details Patient Name: Date of Service: [place], [place] RET M. 12/25/2021 2:30 PM Medical Record Number: [id number] Patient Account Number: [id number] Date of Birth/Sex: Treating RN: 1941/07/06 (81 y.o. [name], [name] Primary Care Firas Guardado: Shon Baton Other Clinician: Referring Debi Cousin: Treating Braison Snoke/Extender: Cherre Huger in Treatment: 10 Active Inactive Wound/Skin Impairment Nursing Diagnoses: Impaired tissue integrity Goals: Ulcer/skin breakdown will have a volume reduction of 30% by week 4 Date Initiated: 10/16/2021 Date Inactivated: 11/21/2021 Target Resolution Date: 11/13/2021 Goal Status: Met Ulcer/skin breakdown will have a volume reduction of 50% by week 8 Date Initiated: 11/21/2021 Target Resolution Date: 01/10/2022 Goal Status: Active Interventions: Assess patient/caregiver ability to obtain necessary supplies Provide education on ulcer and skin care Notes: Electronic Signature(s) Signed: 12/25/2021 5:29:07 PM By: Rhae Hammock RN Entered By: Rhae Hammock on 12/25/2021 17:11:07 -------------------------------------------------------------------------------- Pain Assessment Details Patient Name: Date of Service: [place], [name]. 12/25/2021 2:30 PM Medical Record Number: [id number] Patient Account Number: [id number] Date of Birth/Sex: Treating RN: 21-Jul-1940 (81 y.o. [name], [name] Primary Care Margie Brink: Shon Baton Other Clinician: Referring Saher Davee: Treating Davion Flannery/Extender: Cherre Huger in Treatment: 10 Active Problems Location of Pain Severity and Description of Pain Patient Has Paino No Site Locations Pain  Management and Medication Current Pain Management: Electronic Signature(s) Signed: 12/25/2021 5:29:07 PM By: Rhae Hammock RN Signed: 12/26/2021 11:45:44 AM By: Erenest Blank Entered By: Erenest Blank on 12/25/2021 14:52:15 -------------------------------------------------------------------------------- Patient/Caregiver Education Details Patient Name: Date of Service: [place] RET M. 6/15/2023andnbsp2:30 PM Medical Record Number: [id number] Patient Account Number: [id number] Date of Birth/Gender: Treating RN: 1941/07/08 (81 y.o. [name], [name] Primary Care Physician: Shon Baton Other Clinician: Referring Physician: Treating Physician/Extender: Cherre Huger in Treatment: 10 Education Assessment Education Provided To: Patient Education Topics Provided Wound/Skin Impairment: Methods: Explain/Verbal Electronic Signature(s) Signed: 12/25/2021 5:29:07 PM By: Rhae Hammock RN Entered By: Rhae Hammock on 12/25/2021 17:12:13 -------------------------------------------------------------------------------- Wound Assessment Details Patient Name: Date of Service: [place], [place] RET M. 12/25/2021 2:30 PM Medical Record Number: [id number] Patient Account Number: [id number] Date of Birth/Sex: Treating RN: 02-26-1941 (81 y.o. [name], [name] Primary Care Nia Nathaniel: Shon Baton Other Clinician: Referring Quinley Nesler: Treating Kinsly Hild/Extender: Cherre Huger in Treatment: 10 Wound Status Wound Number: 4 Primary Etiology: Trauma, Other Wound Location: Left, Anterior Lower Leg Wound Status: Open Wounding Event: Trauma Comorbid History: Rheumatoid Arthritis Date Acquired: 10/08/2021 Weeks Of Treatment: 10 Clustered Wound: No Photos Wound Measurements Length: (cm) 0.7 Width: (cm) 0.3 Depth: (cm) 0.2 Area: (cm) 0.165 Volume: (cm) 0.033 % Reduction in Area: 99.6% % Reduction in Volume: 99.6% Epithelialization:  Large (67-100%) Undermining: No Wound Description  Classification: Full Thickness Without Exposed Support Structures Wound Margin: Distinct, outline attached Exudate Amount: Medium Exudate Type: Serosanguineous Exudate Color: red, brown Foul Odor After Cleansing: No Slough/Fibrino Yes Wound Bed Granulation Amount: Large (67-100%) Exposed Structure Granulation Quality: Red, Pink Fascia Exposed: No Necrotic Amount: Small (1-33%) Fat Layer (Subcutaneous Tissue) Exposed: Yes Necrotic Quality: Adherent Slough Tendon Exposed: No Muscle Exposed: No Joint Exposed: No Bone Exposed: No Treatment Notes Wound #4 (Lower Leg) Wound Laterality: Left, Anterior Cleanser Soap and Water Discharge Instruction: May shower and wash wound with dial antibacterial soap and water prior to dressing change. Wound Cleanser Discharge Instruction: Cleanse the wound with wound cleanser prior to applying a clean dressing using gauze sponges, not tissue or cotton balls. Peri-Wound Care Topical Gentamicin Discharge Instruction: apply to wound bed at dressing change Primary Dressing FIBRACOL Plus Dressing, 2x2 in (collagen) Discharge Instruction: Moisten collagen with saline or hydrogel Secondary Dressing Woven Gauze Sponge, Non-Sterile 4x4 in Discharge Instruction: Apply over primary dressing as directed. Secured With Compression Wrap Kerlix Roll 4.5x3.1 (in/yd) Discharge Instruction: Apply Kerlix and Coban compression as directed. Coban Self-Adherent Wrap 4x5 (in/yd) Discharge Instruction: Apply over Kerlix as directed. Compression Stockings Add-Ons Electronic Signature(s) Signed: 12/25/2021 5:29:07 PM By: Rhae Hammock RN Signed: 12/26/2021 11:45:44 AM By: Erenest Blank Entered By: Erenest Blank on 12/25/2021 15:07:03 -------------------------------------------------------------------------------- Vitals Details Patient Name: Date of Service: [place], [place] RET M. 12/25/2021 2:30 PM Medical  Record Number: [id number] Patient Account Number: [id number] Date of Birth/Sex: Treating RN: 11/01/1940 (81 y.o. [name], [name] Primary Care Bradd Merlos: Shon Baton Other Clinician: Referring Imane Burrough: Treating Jandiel Magallanes/Extender: Cherre Huger in Treatment: 10 Vital Signs Time Taken: 14:51 Temperature (F): 98.4 Height (in): 65 Pulse (bpm): 111 Weight (lbs): 133 Respiratory Rate (breaths/min): 18 Body Mass Index (BMI): 22.1 Blood Pressure (mmHg): 161/74 Reference Range: 80 - 120 mg / dl Electronic Signature(s) Signed: 12/26/2021 11:45:44 AM By: Erenest Blank Entered By: Erenest Blank on 12/25/2021 14:52:10

## 2021-12-29 DIAGNOSIS — J9621 Acute and chronic respiratory failure with hypoxia: Secondary | ICD-10-CM | POA: Diagnosis not present

## 2021-12-29 DIAGNOSIS — M0510 Rheumatoid lung disease with rheumatoid arthritis of unspecified site: Secondary | ICD-10-CM | POA: Diagnosis not present

## 2021-12-29 DIAGNOSIS — J849 Interstitial pulmonary disease, unspecified: Secondary | ICD-10-CM | POA: Diagnosis not present

## 2021-12-29 DIAGNOSIS — M80012D Age-related osteoporosis with current pathological fracture, left shoulder, subsequent encounter for fracture with routine healing: Secondary | ICD-10-CM | POA: Diagnosis not present

## 2021-12-29 DIAGNOSIS — S81812D Laceration without foreign body, left lower leg, subsequent encounter: Secondary | ICD-10-CM | POA: Diagnosis not present

## 2021-12-29 DIAGNOSIS — R296 Repeated falls: Secondary | ICD-10-CM | POA: Diagnosis not present

## 2022-01-01 ENCOUNTER — Emergency Department (HOSPITAL_BASED_OUTPATIENT_CLINIC_OR_DEPARTMENT_OTHER)
Admission: EM | Admit: 2022-01-01 | Discharge: 2022-01-01 | Disposition: A | Discharge status: Home / Self Care | Payer: Medicare Other | Attending: Emergency Medicine | Admitting: Emergency Medicine

## 2022-01-01 ENCOUNTER — Emergency Department (HOSPITAL_BASED_OUTPATIENT_CLINIC_OR_DEPARTMENT_OTHER): Payer: Medicare Other

## 2022-01-01 ENCOUNTER — Encounter (HOSPITAL_BASED_OUTPATIENT_CLINIC_OR_DEPARTMENT_OTHER): Payer: Self-pay | Admitting: Emergency Medicine

## 2022-01-01 DIAGNOSIS — S29012A Strain of muscle and tendon of back wall of thorax, initial encounter: Secondary | ICD-10-CM | POA: Insufficient documentation

## 2022-01-01 DIAGNOSIS — I10 Essential (primary) hypertension: Secondary | ICD-10-CM | POA: Insufficient documentation

## 2022-01-01 DIAGNOSIS — W01198A Fall on same level from slipping, tripping and stumbling with subsequent striking against other object, initial encounter: Secondary | ICD-10-CM | POA: Insufficient documentation

## 2022-01-01 DIAGNOSIS — H903 Sensorineural hearing loss, bilateral: Secondary | ICD-10-CM | POA: Diagnosis not present

## 2022-01-01 DIAGNOSIS — W19XXXA Unspecified fall, initial encounter: Secondary | ICD-10-CM

## 2022-01-01 DIAGNOSIS — S29002A Unspecified injury of muscle and tendon of back wall of thorax, initial encounter: Secondary | ICD-10-CM | POA: Diagnosis present

## 2022-01-01 DIAGNOSIS — S0990XA Unspecified injury of head, initial encounter: Secondary | ICD-10-CM | POA: Insufficient documentation

## 2022-01-01 DIAGNOSIS — M50322 Other cervical disc degeneration at C5-C6 level: Secondary | ICD-10-CM | POA: Diagnosis not present

## 2022-01-01 DIAGNOSIS — Z7951 Long term (current) use of inhaled steroids: Secondary | ICD-10-CM | POA: Insufficient documentation

## 2022-01-01 DIAGNOSIS — R42 Dizziness and giddiness: Secondary | ICD-10-CM | POA: Diagnosis not present

## 2022-01-01 DIAGNOSIS — H838X3 Other specified diseases of inner ear, bilateral: Secondary | ICD-10-CM | POA: Diagnosis not present

## 2022-01-01 DIAGNOSIS — R519 Headache, unspecified: Secondary | ICD-10-CM | POA: Diagnosis not present

## 2022-01-01 DIAGNOSIS — Z79899 Other long term (current) drug therapy: Secondary | ICD-10-CM | POA: Insufficient documentation

## 2022-01-01 DIAGNOSIS — J45909 Unspecified asthma, uncomplicated: Secondary | ICD-10-CM | POA: Diagnosis not present

## 2022-01-01 DIAGNOSIS — R0781 Pleurodynia: Secondary | ICD-10-CM | POA: Diagnosis not present

## 2022-01-01 DIAGNOSIS — M546 Pain in thoracic spine: Secondary | ICD-10-CM | POA: Diagnosis not present

## 2022-01-01 DIAGNOSIS — M50323 Other cervical disc degeneration at C6-C7 level: Secondary | ICD-10-CM | POA: Diagnosis not present

## 2022-01-01 NOTE — Discharge Instructions (Signed)
Please use Tylenol or ibuprofen for pain.  You may use 600 mg ibuprofen every 6 hours or 1000 mg of Tylenol every 6 hours.  You may choose to alternate between the 2.  This would be most effective.  Not to exceed 4 g of Tylenol within 24 hours.  Not to exceed 3200 mg ibuprofen 24 hours.  Recommend plenty of rest over the next few days, you can in the rehab exercises I have attached as tolerated.  I recommend that you follow-up with Dr. Marcelino Scot for further evaluation as well as continue speaking to your neurologist, cardiologist, and ENT doctor regarding your ongoing dizziness.  If you have new or worsening symptoms with your dizziness please return to the emergency department for further evaluation.  If you begin to experience any numbness, tingling, difficulty with urination or defecation recommend returning to the emergency department.

## 2022-01-01 NOTE — ED Triage Notes (Signed)
Pt reports falling this morning, hitting her mid back and head. C/o right sided thoracic pain and posterior head pain. Denies neck pain, blood thinners, loc, difficulty breathing.

## 2022-01-01 NOTE — ED Notes (Signed)
Discharge instructions reviewed with patient. Patient verbalizes understanding, no further questions at this time. Medications/prescriptions and follow up information provided. No acute distress noted at time of departure.  

## 2022-01-02 ENCOUNTER — Encounter (HOSPITAL_BASED_OUTPATIENT_CLINIC_OR_DEPARTMENT_OTHER): Payer: Medicare Other | Admitting: Internal Medicine

## 2022-01-02 DIAGNOSIS — T798XXA Other early complications of trauma, initial encounter: Secondary | ICD-10-CM | POA: Diagnosis not present

## 2022-01-02 DIAGNOSIS — L97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 with unspecified severity: Secondary | ICD-10-CM

## 2022-01-02 DIAGNOSIS — J849 Interstitial pulmonary disease, unspecified: Secondary | ICD-10-CM | POA: Diagnosis not present

## 2022-01-02 DIAGNOSIS — S80812A Abrasion, left lower leg, initial encounter: Secondary | ICD-10-CM

## 2022-01-02 DIAGNOSIS — S7002XA Contusion of left hip, initial encounter: Secondary | ICD-10-CM | POA: Diagnosis not present

## 2022-01-02 DIAGNOSIS — M069 Rheumatoid arthritis, unspecified: Secondary | ICD-10-CM | POA: Diagnosis not present

## 2022-01-10 DIAGNOSIS — S81812D Laceration without foreign body, left lower leg, subsequent encounter: Secondary | ICD-10-CM | POA: Diagnosis not present

## 2022-01-10 DIAGNOSIS — J4540 Moderate persistent asthma, uncomplicated: Secondary | ICD-10-CM | POA: Diagnosis not present

## 2022-01-10 DIAGNOSIS — I10 Essential (primary) hypertension: Secondary | ICD-10-CM | POA: Diagnosis not present

## 2022-01-10 DIAGNOSIS — I872 Venous insufficiency (chronic) (peripheral): Secondary | ICD-10-CM | POA: Diagnosis not present

## 2022-01-10 DIAGNOSIS — N393 Stress incontinence (female) (male): Secondary | ICD-10-CM | POA: Diagnosis not present

## 2022-01-10 DIAGNOSIS — B0223 Postherpetic polyneuropathy: Secondary | ICD-10-CM | POA: Diagnosis not present

## 2022-01-10 DIAGNOSIS — J9621 Acute and chronic respiratory failure with hypoxia: Secondary | ICD-10-CM | POA: Diagnosis not present

## 2022-01-10 DIAGNOSIS — K5900 Constipation, unspecified: Secondary | ICD-10-CM | POA: Diagnosis not present

## 2022-01-10 DIAGNOSIS — J849 Interstitial pulmonary disease, unspecified: Secondary | ICD-10-CM | POA: Diagnosis not present

## 2022-01-10 DIAGNOSIS — I700 Atherosclerosis of aorta: Secondary | ICD-10-CM | POA: Diagnosis not present

## 2022-01-10 DIAGNOSIS — M0510 Rheumatoid lung disease with rheumatoid arthritis of unspecified site: Secondary | ICD-10-CM | POA: Diagnosis not present

## 2022-01-10 DIAGNOSIS — E785 Hyperlipidemia, unspecified: Secondary | ICD-10-CM | POA: Diagnosis not present

## 2022-01-10 DIAGNOSIS — M80012D Age-related osteoporosis with current pathological fracture, left shoulder, subsequent encounter for fracture with routine healing: Secondary | ICD-10-CM | POA: Diagnosis not present

## 2022-01-10 DIAGNOSIS — M810 Age-related osteoporosis without current pathological fracture: Secondary | ICD-10-CM | POA: Diagnosis not present

## 2022-01-10 DIAGNOSIS — I771 Stricture of artery: Secondary | ICD-10-CM | POA: Diagnosis not present

## 2022-01-10 DIAGNOSIS — H532 Diplopia: Secondary | ICD-10-CM | POA: Diagnosis not present

## 2022-01-10 DIAGNOSIS — R42 Dizziness and giddiness: Secondary | ICD-10-CM | POA: Diagnosis not present

## 2022-01-10 DIAGNOSIS — K219 Gastro-esophageal reflux disease without esophagitis: Secondary | ICD-10-CM | POA: Diagnosis not present

## 2022-01-10 DIAGNOSIS — J449 Chronic obstructive pulmonary disease, unspecified: Secondary | ICD-10-CM | POA: Diagnosis not present

## 2022-01-10 DIAGNOSIS — R296 Repeated falls: Secondary | ICD-10-CM | POA: Diagnosis not present

## 2022-01-10 DIAGNOSIS — F419 Anxiety disorder, unspecified: Secondary | ICD-10-CM | POA: Diagnosis not present

## 2022-01-10 DIAGNOSIS — D692 Other nonthrombocytopenic purpura: Secondary | ICD-10-CM | POA: Diagnosis not present

## 2022-01-10 DIAGNOSIS — Z8616 Personal history of COVID-19: Secondary | ICD-10-CM | POA: Diagnosis not present

## 2022-01-15 DIAGNOSIS — S81812D Laceration without foreign body, left lower leg, subsequent encounter: Secondary | ICD-10-CM | POA: Diagnosis not present

## 2022-01-15 DIAGNOSIS — J849 Interstitial pulmonary disease, unspecified: Secondary | ICD-10-CM | POA: Diagnosis not present

## 2022-01-15 DIAGNOSIS — M80012D Age-related osteoporosis with current pathological fracture, left shoulder, subsequent encounter for fracture with routine healing: Secondary | ICD-10-CM | POA: Diagnosis not present

## 2022-01-15 DIAGNOSIS — M0510 Rheumatoid lung disease with rheumatoid arthritis of unspecified site: Secondary | ICD-10-CM | POA: Diagnosis not present

## 2022-01-15 DIAGNOSIS — R296 Repeated falls: Secondary | ICD-10-CM | POA: Diagnosis not present

## 2022-01-15 DIAGNOSIS — J9621 Acute and chronic respiratory failure with hypoxia: Secondary | ICD-10-CM | POA: Diagnosis not present

## 2022-02-19 DIAGNOSIS — R42 Dizziness and giddiness: Secondary | ICD-10-CM | POA: Diagnosis not present

## 2022-02-26 DIAGNOSIS — R42 Dizziness and giddiness: Secondary | ICD-10-CM | POA: Diagnosis not present

## 2022-02-26 DIAGNOSIS — H903 Sensorineural hearing loss, bilateral: Secondary | ICD-10-CM | POA: Diagnosis not present

## 2022-03-06 ENCOUNTER — Encounter: Payer: Self-pay | Admitting: Internal Medicine

## 2022-03-06 ENCOUNTER — Ambulatory Visit (INDEPENDENT_AMBULATORY_CARE_PROVIDER_SITE_OTHER): Payer: Medicare Other | Admitting: Internal Medicine

## 2022-03-06 VITALS — BP 122/64 | HR 73 | Ht 65.0 in | Wt 132.8 lb

## 2022-03-06 DIAGNOSIS — R296 Repeated falls: Secondary | ICD-10-CM | POA: Diagnosis not present

## 2022-03-06 DIAGNOSIS — Z79899 Other long term (current) drug therapy: Secondary | ICD-10-CM

## 2022-03-06 DIAGNOSIS — J8489 Other specified interstitial pulmonary diseases: Secondary | ICD-10-CM | POA: Diagnosis not present

## 2022-03-06 DIAGNOSIS — M359 Systemic involvement of connective tissue, unspecified: Secondary | ICD-10-CM

## 2022-03-06 DIAGNOSIS — J45909 Unspecified asthma, uncomplicated: Secondary | ICD-10-CM

## 2022-03-06 DIAGNOSIS — R2689 Other abnormalities of gait and mobility: Secondary | ICD-10-CM | POA: Diagnosis not present

## 2022-03-06 DIAGNOSIS — I2510 Atherosclerotic heart disease of native coronary artery without angina pectoris: Secondary | ICD-10-CM | POA: Diagnosis not present

## 2022-03-06 LAB — PULMONARY FUNCTION TEST
DL/VA % pred: 84 %
DL/VA: 3.42 ml/min/mmHg/L
DLCO cor % pred: 65 %
DLCO cor: 12.57 ml/min/mmHg
DLCO unc % pred: 65 %
DLCO unc: 12.57 ml/min/mmHg
FEF 25-75 Pre: 0.89 L/sec
FEF2575-%Pred-Pre: 65 %
FEV1-%Pred-Pre: 61 %
FEV1-Pre: 1.2 L
FEV1FVC-%Pred-Pre: 101 %
FEV6-%Pred-Pre: 64 %
FEV6-Pre: 1.61 L
FEV6FVC-%Pred-Pre: 105 %
FVC-%Pred-Pre: 61 %
FVC-Pre: 1.62 L
Pre FEV1/FVC ratio: 74 %
Pre FEV6/FVC Ratio: 100 %

## 2022-03-06 NOTE — Progress Notes (Signed)
Spirometry and DLCO performed today. °

## 2022-03-06 NOTE — Patient Instructions (Signed)
Spirometry and DLCO Performed Today.  

## 2022-03-06 NOTE — Patient Instructions (Addendum)
ICD-10-CM   1. 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 Encounter for long-term current use of high risk medication  Z79.899     3. Asthma, unspecified asthma severity, unspecified whether complicated, unspecified whether persistent  J45.909     4. Balance problem  R26.89     5. Frequent falls  R29.6      Interstitial lung disease due to connective tissue disease ([place]) Encounter for long-term current use of high risk medication  -lung function with continued decline - Tolerating lower dose ofev better   - no diarrhea  - weight loss stopped  - not needing zofran with lower dose    Plan - continue ofev '100mg'$  twice daily - can take zofran '4mg'$  twice daily as needed with ofev  - -Continue Rituxan and steroids through the rheumatologist  - but ask if they are willing to change you to [employer] works for RA joint disease and has shown benefit in another type of fibrosis and so might help your type of fibrosis  - I have written to [facility] Eda Paschal at [facility] with this question - continuie  breo 100 dosing daily once with albuterol as needed  - cotninue singulair - do LFT, cbc, bnp, bmet  03/06/2022 or at [place] 2023 visit    Asthma, unspecified asthma severity, unspecified whether complicated, unspecified whether persistent  - stable  Plan  -continue breo scheduled and singular scheduled - flu shot, RSV shot and covid shot in fall 2023  Balance problem Frequent falls   - sorry to hear about your falls x 2 last several months -glad ENT workup normal  Plan PT per PCP

## 2022-03-06 NOTE — Progress Notes (Signed)
Brief patient profile:  67 yowf  never smoker with allergies/inhalers as child outgrew by [employer] then  RA since around 2000  Prednisone  x decades and prev eval by Dr Joya Gaskins around 2004 for sob resolved s maint rx and referred 05/26/2013 by Dr Shelia Media for bronchitis and abn cxr   History of Present Illness  05/26/2013 1st [facility] office visit/ Wert cc June 2014 dx pna  In [place] and remicade stopped and 100% better and placed arencia in September 2014  then abruptly worse first week in November with cough green sputum s nasal symptoms, fever low grade and no cp or cough and completely recovered prior to [place] does not recall abx but issue is why keeps getting sick and abn CT Chest (see below).   Arthritis symptoms well controlled at present on Rx for RA rec Nexium 40 mg Take 30-60 min before first meal of the day and add pepcid 20 mg one at bedtime whenever coughing.     10/19/2017  f/u ov/Wert re:  RA lung dz  Chief Complaint  Patient presents with   Follow-up    Cough is much improved, but has not resolved yet. Cough is non prod. She has not had to use her neb.   Dyspnea:  Not limited by breathing from desired activities  But some doe x steps Cough: daytime > noct dry  Sleep: fine  SABA use:  No saba Medrol 4 mg a/w 82m per day/ ok control of arthritis  rec Start back on gabapentin up to 300 mg each am  in addition to the the two at bedtime  If not better increase the medrol to 8 mg daily until bettter then taper back to where you      01/10/2018  f/u ov/Wert re:  RA  Lung dz Medrol  4 mg  One alternating with a half Chief Complaint  Patient presents with   Follow-up    PFT's done. Her breathing has been gradually worsening since the last visit. She has occ cough- non prod.   Dyspnea gradually worse since last ov:  MMRC1 =  MMRC3 = can't walk 100 yards even at a slow pace at a flat grade s stopping due to sob    Gradually x 3 m / more fatigue / no change in  arthritis  Cough: not an issue rec Protonix 40 mg Take 30-60 min before first meal of the day  GERD diet   01/17/2018 acute extended ov/Wert re: cough on medrol 4 mg  One a/w one half  Chief Complaint  Patient presents with   Acute Visit    started coughing 01/11/18- occ prod with minimal green sputum.  She states also wheezing and having increased SOB.    abruptly worse 01/11/18 with severe 24/7 coughing >>  prod min green mucus esp in am/ assoc with subjective wheeze and did not follow previous contingencies re flutter / saba/ increase medrol and admits she does not rember those written instructions nor how to use the neb provided .  No fever/ comfortable at rest sitting  rec For cough > mucinex dm 1200 mg every 12 hours and cough into the flutter valve as much as possible  Doxycycline 100 mg twice daily x 10 days with glass of water Medrol 457mx 2 now and take 2  daily until cough is better then 1 daily x 5 days and then resume the previous dose  Shortness of breath/ wheezing/ still coughing >  albuterol neb every 4 hours as needed      01/20/2018 acute extended ov/Wert re: refractory cough and sob 01/11/18 Chief Complaint  Patient presents with   Acute Visit    she is not feeling better, coughing , very SOB, wheezing  mucus now clear/scant  on doxy/ neb machine not working (tube would not plug into the side s adequate force and she was not capable of applying it due to RA hands. Cough/ wheeze/ sob 24/7 / flutter not helping/ can't lie down at hs   rec While coughing protonix 40 mg Take 30- 60 min before your first and last meals of the day  Shortness of breath/ wheezing/ still coughing > albuterol neb every 4 hours as needed  Depomedrol 120 mg IM and medrol 32 mg daily x 2 days,  then 16 mg x 3 days,  Then 8 mg x 4 days , then resume the 4 mg daily  For severe cough > tylenol 3# one every 4 hours if needed  Go to ER if condition worsens on above plan       Date of admission: 01/22/2018              Date of discharge: 01/27/2018   History of present illness: As per the H and P dictated on admission, " Jacquelynn Friend  is a 81 y.o. female, w Rheumatoid arthritis, ILD Asthma, apparently c/o increase in dyspnea this evening. Dry cough.   Denies fever, chills, cp, palp,  N/v, diarrhea, brbpr, black stool.   Pt notes recently being given steroid injection in office as well as being placed on doxycycline. This might have helped slightly but pt worse  Hospital Course:  Summary of her active problems in the hospital is as following. 1 dyspnea/hypoxemia/ILD Concerned that likely GERD Is causing ILD. Patient with cough.   Patient with complaints of awakening with cough and also with oral intake which is slightly improved since 01/24/2018. assessed by speech therapy and speech therapy raising concern of esophageal component but no signs of aspiration.   2D echo with a EF of 55 to 60% with no wall motion abnormalities, grade 1 diastolic dysfunction.  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 Pulmonary felt that the patient should be on scheduled Reglan. I have placed the patient on scheduled potassium before sleep. Continue steroids on discharge continue Mucinex and Claritin as well as inhalers. Patient will follow-up with pulmonary outpatient   2.  Gastroesophageal reflux disease Continue PPI and H2 blocker.  I changed PPI to [facility]   3.  Rheumatoid arthritis Outpatient follow-up.     4.  Anxiety Continue Effexor.       All other chronic medical condition were stable during the hospitalization.  Patient was ambulatory without any assistance. On the day of the discharge the patient's vitals were stable , and no other acute medical condition were reported by patient. the patient was felt safe to be discharge at home with family.   Consultants: PCCM  Procedures: Echocardiogram       03/21/2018  f/u ov/Wert re:   S/p admit was transiently  better  and downhill since Labor day on medrol 4 mg daily  Last orencia on Sept 4th 2019  Chief Complaint  Patient presents with   Acute Visit    Per patient, she has had a dry cough since July 2019. She has been wheezing as well. Increased fatigue. Body aches. Denies any fever or chills.   Dyspnea:  MMRC4  = sob if tries to leave home or while getting dressed   Cough: harsh/ hacking mostly dry/ has flutter not using    SABA use: not much better with rx   No obvious day to day or daytime variability or assoc excess/ purulent sputum or mucus plugs or hemoptysis or cp or chest tightness, subjective wheeze or overt sinus or hb symptoms.     Also denies any obvious fluctuation of symptoms with weather or environmental changes or other aggravating or alleviating factors except as outlined above   No unusual exposure hx or h/o childhood pna/ asthma or knowledge of premature birth.   INpatient consult 03/26/18 81 year old with rheumatoid arthoritis.At baseline the patient lives at home with her husband and is independent of ADLs.  Has been on many immune suppressants over decades and curently on orencia x 4 year and prednisone. Does not recollect being on bactrim/dapsone. Chart mentions BOOP/MAI in 2001 but she denies this. 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 Got admitted. Rx with steroids. Per Notes - clnical suspicion of  arytenoid inflmmation related wheeze noticed (she also reports asthma NOS). Follolwup with ENT recommended (but not seen one as yet). She also appears to have passed swallow with rec for regular diet with thin liquids but did to have mild eso stricture and reflux during testing . PFT shows 10% FVC decline for first ime. CT chest at this tme (aug 2019) showed new rLL infiltrate.  ECHO July 2019 without evicence of elevated  PASP and saw cards [facility] June 2019 and was considered to have worsenin dyspnea due to [facility] issues (reports stress test at [facility] that was normal but I cannot see it)   She reports after discharge she got better but in last several weeks has deteriorated with cough and dyspnea. There is new hypoxemia (currently RA with nail polish and poor circulation  - 89% pulse ox) needing 2L [place]. Per [facility] improved with steroids and abx. CTA 03/24/2018 => shows that RLL inifltrate has improved . Other chronic ILD changes + and small  Hiatal hernia + witthout change.   Review of lab work does show eosinophilia at time of admision   EVENTS 03/21/18 - IgE -5, blood allegy panel - negative, 03/23/2018 - - admit . HIGH EOS 2300, ESR 48, BNP 89 , HIV neg 9/12- PCT negative, RVP negative 9/14 -  PCT < 0.1, Urine strep - negative, MRSA PCR - positive. IgE - normal 4, Blood allergy panel repeat - negative 03/26/2018 - leading consideration for airway (BO in RA +/- asthma) related flare either due to MRSA bronchitis or clinical suspicion of arytenoid inflmmation +/- GERD relatd flare (she has small hiatal hernia)  +/- ? Dysphagia  up causing mild hypoxemia acute resp failure, wheeze . Allergy and IgE blood work negative thought. Patient reports being better but says she is choking on drinkin water [facility] MD says wheeze improved significantly with steroids.  RN says was down to RA yesterday evening but needed 1L [place] at sleep. Today -Room air at rest 94% and desaturated to 86% walking 60 feet 03/27/2018  - better. Off o2 at rest. STill coughs with water and when lies down.  Husband at bedside. Both requesting ILD clinic followup . Desaturated t 79% walking 90 feet.   OV 04/12/2018  Subjective:  Patient ID: Tamala Fothergill, female , DOB: 1940/07/24 , age 44 y.o. , MRN: [id number] , ADDRESS: [place] [address]   04/12/2018 -   Chief Complaint  Patient presents with   Consult    Pt is a former MW pt.  Pt  denies any current complaints of cough, SOB, or CP but states the cough she originally had ended her up in the hosp 9/11-9/17 with dx acute respiratory failure. Pt does wear 2pulse with exertion and also wears 2L continuous when at home.     HPI MALLARIE VOORHIES 81 y.o. -presents for follow-up to the ILD clinic.  She is known to have rheumatoid arthritis with ILD changes.  She had been followed by Dr. Christinia Gully.  However in July 2019 in September 2019 she has had 2 admissions to the hospital with respiratory distress and hypoxemic respiratory failure.  In the first 1 that seem to be right lower lobe infiltrate and then she improved from it but in the second 1 even though the right lower lobe infiltrates were better she still was hypoxemic.  Acid reflux and dysphagia was considered a possible etiology but she passed swallow study 2 times.  They thought she had some reflux.  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  At the same time it is not clear if the ILD is progressive based on pulmonary function testing below   At this point in time she tells me that she is getting home physical therapy.  Her fatigue is improving but it is not fully resolved.  She was discharged on continuous oxygen which she is using.  However she is feeling less short of breath.  Today in fact when we turned her oxygen off and walked her she did not desaturate and this is a significant improvement.  She is on monthly Orencia through the [facility] rheumatologist Dr. Eda Paschal.  At this point in time she is put the Orencia on hold.  She told me that she is been on Orencia for 4 years and never had a respiratory exacerbation still recently x 2.  Although before going on Orencia she had pneumonia while on Remicade and the Remicade.  In terms of her rheumatoid arthritis she hardly has any pain.  Her joint architecture is fairly well-preserved because of various immunomodulators over  time.  She says that she was on Remicade for years and when she stopped it for 8 weeks before the switch to [place] she never really had a relapse in her rheumatoid arthritis.  She is largely pain and stiffness free.  She believes she can go without  her Orencia for a while.  Review of the literature shows greater than 10% chance of a respiratory infection especially COPD exacerbation.  Although the time frame for this is unclear.       OV 06/07/2018  Subjective:  Patient ID: Tamala Fothergill, female , DOB: 10-17-1940 , age 15 y.o. , MRN: [id number] , ADDRESS: [place] [address]   06/07/2018 -   Chief Complaint  Patient presents with   Follow-up    ILD, PFT done today, some wheezing and coughing but better tha before   Rheumatoid arthritis ILD and asthma/obstructive lung disease phenotype on Dulera  HPI KYUNG MUTO 81 y.o. -presents for routine follow-up with her husband.  She is here to follow-up with [facility] Dr. Stann Mainland.  She plans to do this in  December 2019.  She continues to be off Orencia.  Her joints are slowly getting stiff again.  She believes that she will need to be back on immunosuppression agent again.  She currently continues Medrol 4 mg alternating with 2 mg.  This for her rheumatoid arthritis.  In terms of her joints she continues on Medrol 4 mg alternating with 2 mg but not on any other immunosuppression agent.  Overall she is been stable but for the last 2 weeks has had green sputum and wheezing and chest congestion and cough.  She recently visited her husband who was hospitalized and walking the long hallways at [facility] made a short of breath but she thinks this is probably baseline for her.  There are no other new issues.  She did have spirometry and DLCO and this shows a decline compared to September 2019 and a similar to July 2019.  It is documented below.  This is probably reflective of a flareup   OV  07/21/2018  Subjective:  Patient ID: Tamala Fothergill, female , DOB: [birth year] , age 89 y.o. , MRN: [id number] , ADDRESS: [place] [address]   07/21/2018 -   Chief Complaint  Patient presents with   Follow-up    Pt states she has been doing well since last visit. States she is about to begin Rituxan with [facility]. Pt still becomes SOB with exertion. Denies any complaints of cough or CP.   Rheumatoid arthritis ILD and asthma/obstructive lung disease phenotype on Dulera  HPI KUMIKO FISHMAN 81 y.o. -presents for follow-up of the above.  Last seen just before Thanksgiving 2019.  In the interim overall stable although on June 23, 2018 she climbs a steep flight of stairs which is unusual exertion for her and she became very dyspneic.  Following day saw Dr. Stann Mainland at [facility] rheumatology and was given Z-Pak and prednisone and started feeling better.  Although it is not fully clear to me she had fever and bronchitic symptoms.  I reviewed Dr. Stann Mainland note.  Dr. Stann Mainland is decided to start Rituxan for rheumatoid arthritis.  She is only having some minimal joint pain at this point.  She is off Orencia and continues to be off [place].  She did have some blood work with [place] before starting Rituxan.  She is due to see Dr. Stann Mainland within the next week and start her Rituxan.  Her liver function test July 18, 2017 is normal hemoglobin is normal.  CRP is also normal.  We did spirometry and walking desaturation test.  These show improvement compared to before and these are documented below.  Currently she not using nighttime or daytime oxygen.  She is wondering if she could switch rheumatology care to [facility].  This is because while she likes [employer] she is getting older and more frail and feels some body local would be of help.  I have sent a message to Dr. Estanislado Pandy inquiring.  Certainly we can help her with Rituxan infusions at [facility] if needed.  She  will check on this with her [facility] rheumatologist.       OV 03/27/2019  Subjective:  Patient ID: Tamala Fothergill, female , DOB: 11-Nov-1940 , age 44 y.o. , MRN: [id number] , ADDRESS: [place] [address]  Rheumatoid arthritis ILD and asthma/obstructive lung disease phenotype on [facility]   03/27/2019 -  Rourine fu   HPI HARMONIE VERRASTRO 81 y.o. -presents for  the above.  Last seen in January 2020.  After that she has seen Dr. Stann Mainland rheumatology at [facility].  She is now getting Rituxan 2 doses every 6 months.  She says this is helped her joints and her stiffness.  She is a little bit more mobile than usual.  However in terms of her respiratory status she continues to have episodic cough.  In June 2020 she again got hypoxemic and got admitted.  Since then she has had episodic cough.  She had a respiratory exacerbation in June 2020 for the admission she got steroids.  This seemed to help.  She is also on a higher dose Dulera right now.  In terms of her cough this seems to be her biggest problem.  She seems to be on Dulera, Singulair scheduled with also Tessalon and Delsym and DuoNeb.  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  Her last high-resolution CT scan of the chest was 1 year ago.  She says the dyspnea itself is not worse.  She is currently not using oxygen.  On exam she did have some wheezing.  Currently she has white and brown sputum but this is baseline.  In terms of a COVID wrist she has been tested recently couple of times and this is been negative.  She is isolating well.  She wanted to know COVID prevention activities and risk status and masking strategies.      OV 05/24/2019  Subjective:  Patient ID: Tamala Fothergill, female , DOB: 1940/11/02 , age 42 y.o. , MRN: [id number] , ADDRESS: [place] [address]   05/24/2019 -   Chief  Complaint  Patient presents with   Follow-up    Pt was recently in the hosp due to ILD. Pt states that she has been better since being out of the hosp.   Rheumatoid arthritis ILD and asthma/obstructive lung disease phenotype on Dulera/Singulair  Post herpetic neuralgia - on elavil, gabapentin, effexor  HPI KAYSEY BERNDT 81 y.o. -returns for follow-up.  At the last visit approximately 2 months ago she was reporting worsening cough following an admission in summer 2020.  Her pulmonary function test suggested worsening ILD status.  Therefore we requested a high-resolution CT chest which she did in October 2020.  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  However in the interim after the CT scan was done towards the end of October 2020 she developed worsening of her cough over 2 weeks and also associated shortness of breath but significantly the cough is much worse.  She ended up getting admitted to the hospital.  There was some hypoxemia.  By this time she had finished a ENT evaluation that did not show any involvement of the arytenoids.  Pulmonary was consulted.  She was given a prednisone burst which she just finished I believe yesterday.  She is back on her baseline Medrol.  And she is feeling better.  Her oxygen status is improved although she is using oxygen at night now.  She is really frustrated with these recurrent flareups and these admissions which ended up with her having a wheeze and also hypoxemia.  Currently she is on her baseline Medrol for rheumatoid arthritis associated with Dulera and Singulair.  She is also on losartan for blood pressure.  Her walking desaturation test is slightly worse than baseline.  She has a GI consult pending because of the recurrent episodes of cough and flareups.  We went over exposure history.  We used interstitial lung disease questionnaire for the exposure history.  Specifically she denies any electronic cigarette use of  marijuana use of cocaine use or any IV drug abuse.  [place] last 14 years.  Asked extensive questions about the home environment it is positive for nebulizer use but the nebulizer does not have mildew or mold in it.  Otherwise no organic antigen exposure.  The house is not damp.  There is no mold or mildew in the shower curtain.  There is no humidifier use no steam iron use.  No Jacuzzi use.  No misting [place] outside to inside the house.  No pet birds.  No pet gerbils no feather pillows.  There is no mold in the [facility] duct.  She does not do any gardening.  Does not use wind instruments.  Also 122 question occupational history elicited and essentially negative.  The other issue is that she has polypharmacy.  She is asking for my help in reducing her medications.  She is on 3 medications for postherpetic neuralgia.  She is on losartan      OV 06/21/2019  Subjective:  Patient ID: Tamala Fothergill, female , DOB: [birth year] , age 54 y.o. , MRN: [id number] , ADDRESS: [place] [address]   06/21/2019 -   Chief Complaint  Patient presents with   televisit    hosp 11/29-12/4 due to covid with pna. pt said that she is doing okay after recent hosp but states she has no energy.      MAHLET JERGENS 81 y.o. - last visit 05/24/2019. Diagnosed with covid-19 on 05/31/2019. Admitted 06/11/2019  - 06/16/2019. Quarantine ends 06/21/2019 today. Rx with Remdesviri and Decadron. Husband also on phone. Questions  1. Quarantine ends - from 06/22/19\ and she is not contagious and likely resistant to reinfection to covid for another few months  2. She is on 10 day dexamethasone for covid - today is last day for it  3. She is on dulera. Hospital gave combivent and she does not want do combivent - this is fine  4. Ofev for ILD - not started it yet . She had questions about side effects. Explained GI and LFT monirtong. SHe wanted to  wait till Jan 2021 and start it . I am ok with that.   5. Small hiat   IMPRESSION: HRCT OCt 2020 1. 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 Findings have progressed since 2017 and 2019 high-resolution chest CT studies. 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 Findings are consistent with UIP per consensus guidelines: Diagnosis of Idiopathic Pulmonary Fibrosis: An Official ATS/ERS/JRS/ALAT Clinical Practice Guideline. [place], Iss 5, [phone], Mar 13 2017. 2. 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 4. Small hiatal hernia.   Aortic Atherosclerosis (ICD10-I70.0).     Electronically Signed   By: Ilona Sorrel M.D.   On: 04/24/2019 13:29    OV 08/03/2019 - face to face visit  Subjective:  Patient ID: Tamala Fothergill, female , DOB: [birth year] , age 19 y.o. , MRN: [id number] , ADDRESS: [place] [address]    Rheumatoid arthritis with Joint s/ILD- Rituxan/steroids ([facility])  - progressive phenotype (CT 2017/2019 -> Oct 2020). Planned Ofev start in Nov 2020 but did not start as of dec 2020. LFT normal Jan 2021  Athma/obstructive lung disease phenotype on Dulera/Singulair  Post herpetic neuralgia - on elavil, gabapentin, effexor  Coronary Artery Calcification - 1 vessel - cardiology referral done  Diagnosed with covid-19 on 05/31/2019. Admitted 06/11/2019  - 06/16/2019. Rx with Remdesviri and Decadron.  Small Hiatal hernia onCT Oct 2020  Rib fracture from fall Dec 2020: Musculoskeletal: There is nondisplaced fractures of the posterior left fifth, 6, 8, 9, and tenth left ribs. There is unchanged slight superior compression deformities of the T5, T6, T10 vertebral bodies with less than 25% loss in height.Associated pulmnary contusion  +    HPI LIAT MAYOL 81 y.o. -presents for a visit. Her nintedanib has been delayed because of COVID-19. Also subsequently a fall. She saw a nurse practitioner on July 20, 2019 and they took a shared decision to start nintedanib. She had a ? telephone visit with Dr. Candie Mile the rheumatologist at [facility]. Patient scheduled for her next Rituxan in February 2021 but reviewed the notes indicates this could be pushed to early spring 2021. Dr. Stann Mainland is okay with the patient study got intubated at this point in time.  Patient has follow-up with Dr. Stann Mainland tomorrow at [facility].  At this face-to-face visit she is here with her husband.  She says she is much better after the Covid and also the fall that fractured her ribs.  Nevertheless all this is left her fatigue.  Infective fatigue scores are much worse.  Also in the last 2 weeks has had increased cough wheezing and shortness of breath.  The sputum was also changed color in the last 1 week to clear green.  This is all new.  Her symptom score is therefore a worse than her baseline.  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  She is only taking 100 mg once a day.  The plan was to go up to 100 mg twice a day next week.  This is a minimum effective dose.  The maximum dose is 150 mg twice a day.  Given her age and comorbidities we are starting at a low dose.  Part of this visit is to make sure that she is tolerating the drug fine.  And so far she is.  She is interested in the ILD-pro registry study done by the [facility].   She is working with physical therapy for her fatigue.  Ambulatory Walk 07/20/2019 2 Lap- O2 92% RA; HR 109 No shortness of breath She did not need to stop Used walker    OV 09/20/2019  Subjective:  Patient ID: Tamala Fothergill, female , DOB: [birth year] , age 63 y.o. , MRN: [id number] , ADDRESS: [place] [address]   09/20/2019 -   Chief Complaint  Patient presents with   Televisit    Called and spoke with pt who stated she has been feeling okay since last visit. Pt stated she is still taking OFEV and denies any complaints. Pt states she believes her breathing is stable at this point.    Rheumatoid arthritis with Joint s/ILD- Rituxan/steroids (duke uinviesty)  - progressive phenotype (CT 2017/2019 -> Oct 2020). Planned Ofev start in Nov 2020 but did not start as of dec 2020. 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 Admitted 06/11/2019  - 06/16/2019. Rx with Remdesviri and Decadron.  Small Hiatal hernia on CT Oct 2020  Rib fracture from fall Dec 2020: Musculoskeletal: There is nondisplaced fractures of the posterior left fifth, 6, 8, 9, and tenth left ribs. There is unchanged slight superior compression deformities of the T5, T6, T10 vertebral bodies with less than 25% loss in height.Associated pulmnary contusion +   HPI MaTAELYR [id number]. - has 2nd covid vaccine later this week. Rituxan is end of the month. Baseline RA regiment is On dulera for associated obstructio of lung. Continue ofev 10023mid since mid jan 2021 for ILD. No side effects. Did see BetDerl Barrowr face to face visit early feb 2021 -> for bronchitis and felt better after prdnisone and zpak. Currently on medrol   8 mg day and staying there per BetDerl BarrowWants t oknow if she can taper. Wants to know how she can prevent care flare ups. Explained ofev, masking and social distancing  prevent respiratory infection.Denies choking on food or aspirating. Denies mold in house - relatively news. Denies dog. Denies cat. Denies carious teeth. Denies post nasal drip  OVerll feels better and improved dyspnea.     OV 12/18/2019  Subjective:  Patient ID: MarTamala Fothergillemale , DOB: 6/2March 06, 1942age 59 17o. , MRN: [id number]: [id number] RusShon BatonD Rheumatologist-Dr. JenEda Paschal [facility]: Dr. RamVeryl Speak/01/2020 -   Chief Complaint  Patient presents with   Follow-up    no worse   Rheumatoid arthritis with Joint s/ILD- Rituxan/steroids (duke uinviesty)  - progressive phenotype (CT 2017/2019 -> Oct 2020). Planned Ofev start in Nov 2020 but did not start as of dec 2020. 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 Admitted 06/11/2019  - 06/16/2019. Rx with Remdesviri and Decadron.  Small Hiatal hernia on CT Oct 2020  Rib fracture from fall Dec 2020: Musculoskeletal: There is nondisplaced fractures of the posterior left fifth, 6, 8, 9, and tenth left ribs. There is unchanged slight superior compression deformities of the T5, T6, T10 vertebral bodies with less than 25% loss in height.Associated pulmnary contusion +  -On Reclast once a year through Dr. JennifEda Paschal MargarTamala Fothergillo61-returns for follow-up of her ILD.  It has been a few month since I last saw her.  In this time she did not taper her Medrol down.   She stated 8 mg/day.  She was  originally taking the Medrol for her rheumatoid arthritis for many years.  She recently increased the dose from 4 mg / 2 mg every other day to 8 mg daily because of recurrent respiratory exacerbations.  She is asking for [facility] refills for obstructive lung disease.  She is getting Rituxan through Dr. Eda Paschal for rheumatoid arthritis.  Her next Rituxan is in September 2021.  She saw Dr. Stann Mainland in April 2021 and reviewed the note.  Her liver function test at the time was fine.  In terms of her ILD she continues on nintedanib 100 mg twice daily.  She says she has no side effects from the drug she is tolerating it quite well.  We discussed about increasing the dose but she wants to hold off because she just got a new supply.  However she is open to increasing the dose when the supply runs out.  She is due for liver function test today.  In terms of overall symptoms things have improved as can be seen below and the symptom score.Marland Kitchen  However her main concern is that of fatigue.  She is open to attending pulmonary rehabilitation.  She is not using oxygen at home.  Sometime back when we tested overnight oxygen desaturation test she did not desaturate.  Walking desaturation test today stable.  She does notice of note that her blood pressure has gone up.  She is working with her primary care physician on this.  She is on losartan.    OV 04/10/2020   Subjective:  Patient ID: Tamala Fothergill, female , DOB: [birth year], age 81 y.o. years. , MRN: [id number],  ADDRESS: [address] [place] [address] PCP  Shon Baton, MD Providers : Treatment Team:  Attending Provider: Brand Males, MD   Chief Complaint  Patient presents with   Follow-up    CT scan 8/23, has not increased Ofev, shortness of breath with exertion.    Rheumatoid arthritis with Joint s/ILD- Rituxan/steroids (duke uinviesty)  - progressive phenotype (CT 2017/2019 -> Oct 2020). Planned Ofev  start in Nov 2020 but did not start as of dec 2020. 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 Admitted 06/11/2019  - 06/16/2019. Rx with Remdesviri and Decadron.  Admitted August 2021 for respiratory infection not otherwise specified.  [place]  Small Hiatal hernia on CT Oct 2020  Rib fracture from fall Dec 2020: Musculoskeletal: There is nondisplaced fractures of the posterior left fifth, 6, 8, 9, and tenth left ribs. There is unchanged slight superior compression deformities of the T5, T6, T10 vertebral bodies with less than 25% loss in height.Associated pulmnary contusion +  -On Reclast once a year through Dr. JenEda Paschal HPI MarTamala Fothergill 81o. -returns for follow-up.  Last week by myself early June 2021.  Then after that in early August 2020 when she got admitted for respiratory infection not otherwise specified.  She was hypoxemic on room  air.  This followed a trip to [place].  In the follow-up phase end of August 2020 when she had high-resolution CT chest that showed increased groundglass opacities but when compared to a CT from 8 months ago.  Also chronic ILD had worsened.  I personally visualized the image at this point in time she is improved from this admission she is not using oxygen at all.  In fact walking desaturation test shows near baseline.  Nevertheless overall her symptom scores have declined over time ILD symptom score is listed  below.  She is really concerned about several issues.  In terms of her ILD she was inquiring about escalating to 150 mg twice daily.  Even at 100 mg twice daily she is significantly symptomatic in terms of nonrespiratory issues such as nausea and dizziness and fatigue.  I did caution her that these could all get worse.  Therefore she is just opted to be at 100 mg twice daily  -Recurrent admissions for respiratory issues [I did address that some of this was Covid and fall related and not necessarily her usual summer exacerbation].  Nevertheless, I asked about exposures at home.  She does not have a feather blanket or feather pillow or feather jacket.  She denies any mold or bird exposure or mildew exposure at home.  Does not do any gardening or wind instruments.  She is immunosuppressed and I did notice that she is not on Bactrim for PCP prophylaxis.  In 2019 when we checked G6PD the lab could not process this lab.  She is interested in Bactrim prophylaxis.  She is interested in okay getting G6PD rechecked   -She is dealing with significant amount of dizziness.  Neurology is sending her to neuro rehab.  It is believed it is multifactorial.  I reviewed her recent CT head angio report.  I personally visualized the image   -She is also worried about cough.  Currently cough is tolerable but she says prior to her exacerbations cough gets worse.  We did discuss with her that if she is aspirating which she denied.  We did discuss about the possibility of starting Bactrim and monitoring her cough and she is fine with that.  I did review old records Lungs/Pleura: Biapical pleuroparenchymal scarring. Patchy and largely peripheral peribronchovascular ground-glass, increased from 07/03/2019. Underlying subpleural reticulation, traction bronchiectasis/bronchiolectasis and scattered honeycombing, similar to minimally increased from 07/03/2019. Calcified granulomas. No pleural fluid. Airway is unremarkable. No air  trapping.   Upper Abdomen: Visualized portions of the liver, adrenal glands, kidneys, spleen, pancreas, stomach and bowel are unremarkable with the exception of a small hiatal hernia. Cholecystectomy. Calcified upper abdominal lymph nodes.   Musculoskeletal: Degenerative changes in the spine. No worrisome lytic or sclerotic lesions.   IMPRESSION: 1. Increased patchy pulmonary parenchymal ground-glass may be due to an atypical/viral pneumonia, including due to COVID-19. 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 2. Aortic atherosclerosis (ICD10-I70.0). Coronary artery calcification.     Electronically Signed   By: Lorin Picket M.D.   On: 03/04/2020 12:58    OV 08/01/2020  Subjective:  Patient ID: Tamala Fothergill, female , DOB: 26-Jan-1941 , age 65 y.o. , MRN: [id number] , ADDRESS: [address] [place] [address] PCP Shon Baton, MD Patient Care Team: Shon Baton, MD as PCP - General (Internal Medicine) Buford Dresser, MD as PCP - Cardiology (Cardiology)  This Provider for this visit: Treatment Team:  Attending Provider: Brand Males, MD   Rheumatoid arthritis with Joint s/ILD- Rituxan/steroids (duke uinviesty)  - progressive phenotype (CT 2017/2019 -> Oct 2020). Planned Ofev start in Nov 2020 but did not start as of dec 2020. 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 Admitted 06/11/2019  - 06/16/2019. Rx with Remdesviri and Decadron.  Admitted August 2021 for respiratory infection not otherwise specified.  [place]  Small Hiatal hernia on CT Oct 2020  Rib fracture from fall Dec 2020: Musculoskeletal: There is nondisplaced fractures of the posterior left fifth, 6, 8, 9, and tenth left ribs. There is unchanged slight superior compression deformities of the T5, T6, T10 vertebral bodies with less than 25% loss in height.Associated pulmnary contusion +  -On Reclast once a year through Dr. JenEda Paschal 08/01/2020 -   Chief Complaint  Patient presents with   Follow-up    Doing well     HPI MarRAYLEE STREHL 74o. -returns for follow-up.  She says since last visit she has lost weight.  In fact tracking her weight it appears she is definitely lost weight.  She has occasional intermittent nausea once every few weeks this is because she does not time her nintedanib well with food.  She has a low appetite as well.  She tried to go up on the Internet but she is unable to.  She takes a low-dose 100 mg twice daily.  She is on Rituxan and prednisone.  She is up-to-date with her COVID-vaccine.  Current shortness of breath standpoint she is stable.  Symptoms are stable.  CT Chest data  OV 10/22/2020  Subjective:  Patient ID: MarTamala Fothergillemale , DOB: 6/208/09/1942age [age]o. , MRN: [id number]: [address] [id number] RusShon BatonD Patient Care Team: RusShon BatonD as PCP - General (Internal Medicine) ChrBuford DresserD as PCP - Cardiology (Cardiology)  This Provider for this visit: Treatment Team:  Attending Provider: RamBrand MalesD    10/22/2020 -   Chief Complaint  Patient presents with   Follow-up    Still having shortness of breath with activity, denies  increase in severity.     Rheumatoid arthritis with Joint s/ILD- Rituxan/steroids (duke uinviesty)  - progressive phenotype (CT 2017/2019 -> Oct 2020). Planned Ofev start in Nov 2020 but did not start as of dec 2020. 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 Admitted 06/11/2019  - 06/16/2019. Rx with Remdesviri and Decadron.  - sp evushed x 2  Admitted August 2021 for respiratory infection not otherwise specified.  Following trip to [place].  Was hypoxemic.  Small Hiatal hernia on CT Oct 2020  Rib fracture from fall Dec 2020: Musculoskeletal: There is nondisplaced fractures of the posterior left fifth, 6, 8, 9, and tenth left ribs. There is unchanged slight superior compression deformities of the T5, T6, T10 vertebral bodies with less than 25% loss in height.Associated pulmnary contusion +  -On Reclast once a year through Dr. Eda Paschal  Normal ECHO Summer 2020   HPI LAMAYA HYNEMAN 81 y.o. -returns for follow-up.  At this point in time for her ILD she is taking nintedanib 100 mg twice daily.  Last liver function test was in December 2021.  She is now attending pulmonary rehabilitation.  She is not sure it is helping her.  She tells me that she does not desaturate at pulmonary rehabilitation so she not using her oxygen.  Review of the records indicate same but it appears that today her dyspnea is worse in terms of symptom score although she is denying that.  Walking desaturation  test in the office today with a forehead probe suggest that pulse ox is declined.  Her pulmonary function test also shows 5% FVC decline.  She was surprised by this.  In talking to her realize that for coexistent obstructive lung disease she is no longer taking Dulera.  She is willing to take inhaler which she will have to twist instead of having to pump with her fingers because she has rheumatoid arthritis.  She is no longer losing weight though.  She had questions about taking monoclonal antibody prophylaxis.  She is under the impression she needs to take it every 6 months with Rituxan dosing.  I did explain to her that in the presence of Rituxan the body is not able to respond actively to vaccine and that is why she is having prophylactic antibody.  She is now had 2 doses the last 1 being a few weeks ago first 1 was in January 2022.  I have written to the [facility] coordinator Wilber Bihari 27 monoclonal antibody prophylaxis schedule.  Expressed to patient that because of monoclonal antibody prophylaxis she did not do the vaccine.  Last echocardiogram was in summer 2020.      OV 12/23/2020  Subjective:  Patient ID: Tamala Fothergill, female , DOB: 09/18/1940 , age 11 y.o. , MRN: [id number] , ADDRESS: [place] [address] PCP Shon Baton, MD Patient Care Team: Shon Baton, MD as PCP - General (Internal Medicine) Buford Dresser, MD as PCP - Cardiology (Cardiology)  This Provider for this visit: Treatment Team:  Attending Provider: Brand Males, MD   12/23/2020 -   Chief Complaint  Patient presents with   Follow-up    PFT performed today. Pt states she has been doing okay since last visit and denies any complaints.     HPI YESICA KEMLER 81 y.o. -returns for follow-up.  There is concern for progression.  Last visit we have made sure she added Breo.  She says she feels better.  In fact symptom score is better.  However pulmonary function test shows  continued decline.  She is on the low-dose nintedanib.  She says if she does not take the nintedanib exactly with food and even if  she takes it 15 to 20 minutes later she will have nausea.  Today she had nausea but otherwise if she is diligent she can maintain.  I informed her the pulmonary function test shows continued decline.  She is on Rituxan prednisone and low-dose nintedanib.  She initially said she will not be able to tolerate the higher dose nintedanib but after realizing that her nausea is mild and her weight loss is stable she says she is willing to give the higher dose nintedanib and attend.  I was able to get her some donor sample of nintedanib.  The lot number of this is [id number].  Expiration date is November 2023.  The serial number is [id number].  The GT IN number is [id number]       ECHO 11/3120  IMPRESSIONS     1. Left ventricular ejection fraction, by estimation, is 65 to 70%. The  left ventricle has normal function. The left ventricle has no regional  wall motion abnormalities. Left ventricular diastolic parameters are  consistent with Grade I diastolic  dysfunction (impaired relaxation). Elevated left ventricular end-diastolic  pressure. The E/e' is 74. The average left ventricular global longitudinal  strain is -18.4 %.   2. Right ventricular systolic function is normal. The right ventricular  size is normal. There is mildly elevated pulmonary artery systolic  pressure. The estimated right ventricular systolic pressure is 23.9 mmHg.   3. The mitral valve is abnormal. Mild mitral valve regurgitation.   4. The aortic valve is tricuspid. Aortic valve regurgitation is not  visualized. Mild aortic valve sclerosis is present, with no evidence of  aortic valve stenosis.   5. The inferior vena cava is normal in size with greater than 50%  respiratory variability, suggesting right atrial pressure of 3 mmHg.   Comparison(s): Changes from prior study are noted. 12/18/18  EF 60-65%. PA  pressure 59mHg.    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  OV 03/10/2021  Subjective:  Patient ID: MTamala Fothergill female , DOB: [birth year], age 81y.o. , MRN: [id number], [place] [address] [id number] RShon Baton MD Patient Care Team: RShon Baton MD as PCP - General (Internal Medicine) CBuford Dresser MD as PCP - Cardiology (Cardiology)  This Provider for this visit: Treatment Team:  Attending Provider: RBrand Males MD    03/10/2021 -   Chief Complaint  Patient presents with   Follow-up    Pt states she has questions about ofev she currently on it but having side effects.    Rheumatoid arthritis with Joint s/ILD- Rituxan/steroids (duke uinviesty)  - progressive phenotype (CT 2017/2019 -> Oct 2020). Planned Ofev start in Nov 2020 but did not start as of dec 2020. 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 Admitted 06/11/2019  - 06/16/2019. Rx with Remdesviri and Decadron.  - sp evushed x 2  - 2nd covid by hx June 2022 - evusheld repeat Oct 2022  Admitted August  2021 for respiratory infection not otherwise specified.  Following trip to [place].  Was hypoxemic.  Small Hiatal hernia on CT Oct 2020  Rib fracture from fall Dec 2020: Musculoskeletal: There is nondisplaced fractures of the posterior left fifth, 6, 8, 9, and tenth left ribs. There is unchanged slight superior compression deformities of the T5, T6, T10 vertebral bodies with less than 25% loss in height.Associated pulmnary contusion +  -On Reclast once a year through Dr. 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  -returns for follow-up.  She presents with her husband Festus Aloe.  Last visit she wanted to increase the nintedanib to 150 mg twice daily but this is made her have more nausea and vomiting.  The diarrhea is very mild.  She then reduced her nintedanib 200 mg twice daily but still having side effects.  She is now started losing weight again.  She is really frustrated by this.  Her husband asked about pirfenidone.  Explained that the research with progressive pulmonary fibrosis is limited in the case of pirfenidone so nintedanib is the first choice.  Did agree that if nintedanib did not work we would try pirfenidone.  She wanted know what her options were.  We discussed about stopping nintedanib and improving quality of life but face the risk of continued progression pulmonary fibrosis.  She was not that enthused about this choice.  We discussed about trying Zofran with the nintedanib.  She seemed more receptive to this idea.  We decided that we will try with 100 mg twice daily at the lower dose and then see.  She believes asthma is under control.  Her pulmonary function test shown below seems to fluctuate.  Overall symptom severity stable.  She wanted a handicap placard signed today which I did.       OV 04/08/2021  Subjective:  Patient ID: Tamala Fothergill, female , DOB: 01/13/1941 , age 56 y.o. , MRN: [id number] ,  ADDRESS: [place] [id number] PCP Shon Baton, MD Patient Care Team: Shon Baton, MD as PCP - General (Internal Medicine) Buford Dresser, MD as PCP - Cardiology (Cardiology)  This Provider for this visit: Treatment Team:  Attending Provider: Brand Males, MD    04/08/2021 -   Chief Complaint  Patient presents with   Follow-up    Pt states she has been doing okay since last visit and denies any real complaints. States breathing has been doing okay and states the nausea went away.    Rheumatoid arthritis with Joint s/ILD- Rituxan/steroids ([facility])  - progressive phenotype (CT 2017/2019 -> Oct 2020). Planned Ofev start in Nov 2020 but did not start as of dec 2020. LFT normal Jan 2021   - Rituxan via [facility] -September 2021 + Medrol 2m/2mg QOD baseline many decades (on 858mper day since feb 2021 due to recurrent asthma flares)  - nintedanib for her ILD she started it on July 31, 2019 - started 10054mwice daily (started low dose du eto side effect concern) -> increased to 150m97md d June 2022 -> reduced to 100mg73m aug 2022 due tow eight loss  -  ILD PRO registry study - since 08/16/19  Athma/obstructive lung disease phenotype on Dulera/Singulair  Post herpetic neuralgia - on elavil, gabapentin, effexor  Coronary Artery Calcification - 1 vessel -  S/p - covid-19 on 05/31/2019. Admitted 06/11/2019  - 06/16/2019. Rx with Remdesviri and Decadron.  - sp evushed x 2  Admitted August 2021 for respiratory infection not otherwise specified.  Following trip to [place].  Was hypoxemic.  Small Hiatal hernia on CT Oct 2020  Rib fracture from fall Dec 2020: Musculoskeletal: There is nondisplaced fractures of the posterior left fifth, 6, 8, 9, and tenth left ribs. There is unchanged slight superior compression deformities of the T5, T6, T10 vertebral bodies with less than 25% loss in height.Associated pulmnary contusion +  -On Reclast  once a year through Dr. 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  Last seen 4 weeks ago.  At that time she was losing weight and having significant GI side effects with nintedanib full dose.  Told her to reduce the nintedanib dose to 100 mg twice daily and also take Zofran as needed.  She is taking Zofran as needed.  She is tolerating the nintedanib low-dose much better.  Symptom scores are listed below.  Overall she feels better.  Weight loss is stabilized.  Infectious gained 1 pound of weight.  She feels that she will just take with nintedanib 100 mg twice daily using Zofran as needed.  She does not want to change the schedule.  I fully agree with her and supported on that.  She is aware that the low-dose is less effective but this might be best balance between risk and benefit.  She did ask about getting a COVID by Valent mRNA booster 2 oh micron and BA.5.  According to history she has had COVID x2.  Most recently was in June 2022 which would be against the current viral strain.  In addition she is got monoclonal antibody prophylaxis euvsheld in past.  She does socially isolate herself to the extent possible and masks.  She is going to get the monoclonal antibody prophylaxis again in a few weeks when she gets her Rituxan.  We did discuss the fact that because she is on Rituxan she is immunosuppressed and does not make antibodies and then the facet antibody transfer that she gets through [facility] is risk protective.  Additional risk protective feature is the fact that she had a current strain of COVID-19 and potentially has natural immunity.  Therefore it is her call to get the bivaet booster although she wanted to avoid it and I supported this in a shared decision making she could hold off till at least January 2023 when it  will be 6 months since her natural infection.     OV 07/18/2021  Subjective:  Patient ID: Tamala Fothergill, female , DOB: [birth year] , age 21 y.o. , MRN: [id number] , ADDRESS: [place] [id number] PCP Shon Baton, MD Patient Care Team: Shon Baton, MD as PCP - General (Internal Medicine) Buford Dresser, MD as PCP - Cardiology (Cardiology)  This Provider for this visit: Treatment Team:  Attending Provider: Brand Males, MD    07/18/2021 -   Chief Complaint  Patient presents with   Follow-up    Pt states she has been doing okay since last visit. States  her breathing is about the same.    Rheumatoid arthritis with Joint s/ILD- Rituxan/steroids (duke uinviesty)  - progressive phenotype (CT 2017/2019 -> Oct 2020). Planned Ofev start in Nov 2020 but did not start as of dec 2020. LFT normal Jan 2021   - Rituxan via [facility] -September 2021 + Medrol 32m/2mg QOD baseline many decades (on 868mper day since feb 2021 due to recurrent asthma flares)  - nintedanib for her ILD she started it on July 31, 2019 - started 1007mwice daily (started low dose du eto side effect concern) -> increased to 150m78md d June 2022 -> reduced to 100mg87m aug 2022 due tow eight loss  - ILD PRO registry study - since 08/16/19  Athma/obstructive lung disease phenotype on Dulera/Singulair  Post herpetic neuralgia - on elavil, gabapentin, effexor  Coronary Artery Calcification - 1 vessel -  S/p - covid-19 on 05/31/2019. Admitted 06/11/2019  - 06/16/2019. Rx with Remdesviri and Decadron.  - sp evushed x 2  Admitted August 2021 for respiratory infection not otherwise specified.  [place]  Small Hiatal hernia on CT Oct 2020  Rib fracture from fall Dec 2020: Musculoskeletal: There is nondisplaced fractures of the posterior left fifth, 6, 8, 9, and tenth left ribs. There is unchanged slight superior compression deformities of the  T5, T6, T10 vertebral bodies with less than 25% loss in height.Associated pulmnary contusion +  -On Reclast once a year through Dr. JenniEda Paschalmal ECHO Summer 2020 -> [facility] 30[facility] w38s GR1 ddx   xxxx HPI MargaDENALI SHARMA.24 -presents for follow-up.  Last visit September 2022.  Since then she is doing stable.  With a lower dose of nintedanib no more diarrhea.  Weight loss is stopped.  Barely any nausea.  She is not needing Zofran for nausea control.  Her main concern right now is that she is having imbalance issues.  She is stopped working with the physical therapy.  She thinks it is 1 of proprioception but also long-term muscle loss and sarcopenia.  I have advised her to take this with her primary care/rheumatology.  Terms of asthma is stable on Breo and Singulair.  Last liver function test April 2022 in our chart.  Latest pulmonary function test shows stability    CT Chest data  No results found.            OV 03/06/2022  Subjective:  Patient ID: MargaTamala Fothergillale , DOB: 01/05/08/06/1942e 81 y.84 , MRN: [id number] BatonPatient Care Team: RussoShon Batonas PCP - General (Internal Medicine) ChrisBuford Dresseras PCP - Cardiology (Cardiology)  This Provider for this visit: Treatment Team:  Attending Provider: RamasBrand Males   03/06/2022 -   Chief Complaint  Patient presents with   Follow-up    PFT performed today.  Pt states she has been doing okay since last visit. States her breathing has been doing okay but states she has been having some problems with balance.    Rheumatoid arthritis with Joint s/ILD- Rituxan/steroids (duke uinviesty)  - progressive phenotype (CT 2017/2019 -> Oct 2020). Planned Ofev start in Nov 2020 but did not start as of dec 2020. LFT normal Jan 2021   [facility] + Medrol 4mg/261m QOD baseline many decades (on 8mg pe5may  since feb 2021 due to recurrent asthma flares)  - nintedanib for her ILD she started it on July 31, 2019 - started 145m twice daily (started low dose du eto side effect concern) -> increased to 1560mbid d June 2022 -> reduced to 10092mid aug 2022 due tow eight loss  - ILD PRO registry study - since 08/16/19  Athma/obstructive lung disease phenotype on Dulera/Singulair  Post herpetic neuralgia - on elavil, gabapentin, effexor  Coronary Artery Calcification - 1 vessel -  S/p - covid-19 on 05/31/2019. Admitted 06/11/2019  - 06/16/2019. Rx with Remdesviri and Decadron.  - sp evushed x 2  Admitted August 2021 for respiratory infection not otherwise specified.  [place]  Small Hiatal hernia on CT Oct 2020  Rib fracture from fall Dec 2020: Musculoskeletal: There is nondisplaced fractures of the posterior left fifth, 6, 8, 9, and tenth left ribs. There is unchanged slight superior compression deformities of the T5, T6, T10 vertebral bodies with less than 25% loss in height.Associated pulmnary contusion +  -On Reclast once a year through Dr. JenEda Paschalormal ECHO Summer 2020 [facility] 30[facility]75[facility]  -  Normal ECHO may 2022     HPI MarJAZELYN SIPE 19o. -returns for follow-up.  Last seen in January 2023.  She says that from a breathing standpoint she is stable but she has had balance issues.  She has had 2 accidental falls.  The first 1 was in March 2023 while entering the house along the steps and she landed in the bushes and had 25 stitches to her left lower extremity.  She also had collarbone fracture.  Then again a fall in the driveway 2 months ago but with just bruising.  She is now going to undergo physical therapy.  ENT evaluation did not show any ENT causes for the fall.  In terms of her dyspnea and walking desaturation test in the office she is stable but her pulmonary function  test shows continued decline.  She is on nintedanib at the lower dose because she is not able to tolerate the higher dose.  On this lower dose she still requiring Zofran for control of nausea.  She takes Zofran preemptively.  There are no other issues.  The last echo and CT scan was over a year ago.       SYMPTOM SCALE - ILD 03/27/2019  05/24/2019 (2-3 weeks pre cpovid)  08/03/2019 Post covid Post fall rib # 12/18/2019  04/10/2020 142# resp virus admit aug 2021 nos 08/01/2020 134#  10/22/2020 136# Now doing rehab 12/23/2020 136# 03/10/2021 131# Ofev 150m29md 04/08/2021 132# Ofev 100mg29m 07/18/2021 133# ofev 100 x 2 03/06/2022 132#, ofev 100x2  O2 use ra       ra ra ra ra ra  Shortness of Breath 0 -> 5 scale with 5 being worst (score 6 If unable to do)             At rest '0 2 3 2 3 0 3 0 3 1 ' 2 1  Simple tasks - showers, clothes change, eating, shaving '0 3 2 3 4 1 3 2 3 1 ' 3 3  Household (dishes, doing bed, laundry) '2 4 5 3 4 3 3 3 4 3 3 4  ' Shopping '2 3 6 4 4 4 4 3 4 4 4 4  ' Walking level at own pace '2 3 4 4 3 2 4 ' 2  '3 3 3 4  ' Walking up Stairs '3 3 5 4 4 4 4 3 4 4 4 5  ' Total (40 - 48) Dyspnea Score '9 17 25 20 22 14 21 13 21 16 19 21  ' How bad is your cough? '2 2 3 1 2 ' 0 0 0 1 0 0 0  How bad is your fatigue 3 3.5 5 3.'5 3 4 3 2 3 3 4 4  ' nause    0 1 1 0 1 2.'5 1 1 1  ' vomit    0 1 1 0 1 2.'4 1 1 1  ' diarrhea    0 1 0 0 0 1 0 0 0  anxiety    0 2 0 1 0 1 0 0 1  depression    0 1 0 0 0 0 0 0 0     Simple office walk  feet x  3 laps goal with forehead probe 04/12/2018  07/21/2018  05/24/2019  12/18/2019  04/10/2020  08/01/2020  10/22/2020  03/10/2021  07/18/2021  03/06/2022   O2 used Room air - off o2 x 10 min Room air  Room air ra ra ra ra ra ra ra  Number laps completed 3 x 185 feet 3 x 250 feet    r    3  Comments about pace normal normal   Slow, no walker Slow . No walker.  Slow. No walker. No cane walker    Resting Pulse Ox/HR 99% and 72/min 97% and 74/min 97% and 81/min 97% and 76/m 96% and  88/min 94% and 83 96% and HR 79 97% and HR 51 100% and 78 99% and HR 73  Final Pulse Ox/HR 98% and 95/min 96% and 96/min 94% and 107/min 93% and 94 92% and 97 90% and 100 89% and 104/min 92% and 106 97% and 102 92% and HR 105  Desaturated </= 88% no no no no no yes yes no no no  Desaturated <= 3% points no no Yes, 3 pints Ues, 4 points Yes,  4 points Yes, 4 points Yes, 7 points  Yes, 3 pints Yes, 7 poins  Got Tachycardic >/= 90/min yes yes yes yes yes yes yes   yes  Symptoms at end of test No complaint Mild dyspnea Mod dyspnea Some dyspnea dyspnea dyspnie cat 3d Dyspneic moderate  Dyspneic and wheezing Mod dyspnea Mopd dyspea  Miscellaneous comments improed from hospital Same v improved woprse same  Wobbly gait baseline Needed 2 break  Stopped x 2 to get balanced and staggered while alking     PFT     Latest Ref Rng & Units 03/06/2022    3:01 PM 07/02/2021    3:07 PM 03/07/2021   11:05 AM 12/23/2020    1:03 PM 10/22/2020    9:05 AM 09/10/2020    9:52 AM 04/08/2020    9:27 AM  PFT Results  FVC-Pre L 1.62  P 1.69  1.78  1.66  1.73  1.76  1.83   FVC-Predicted Pre % 61  P 62  66  60  63  64  66   Pre FEV1/FVC % % 74  P 73  72  71  76  75  74   FEV1-Pre L 1.20  P 1.23  1.28  1.17  1.30  1.32  1.35   FEV1-Predicted Pre % 61  P 61  64  57  64  64  66   DLCO uncorrected ml/min/mmHg 12.57  P 15.35  12.94  14.98  13.67  15.38  14.39   DLCO UNC% % 65  P 79  66  77  70  79  74   DLCO corrected ml/min/mmHg 12.57  P 15.35  12.94  14.98  13.80  15.38  15.49   DLCO COR %Predicted % 65  P 79  66  77  71  79  79   DLVA Predicted % 84  P 100  84  86  80  102  108     P Preliminary result       has a past medical history of Abnormal finding of blood chemistry, Asthma, H/O measles, H/O varicella, Hypertension, 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[place]), 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   reports that she has never smoked. She has never used smokeless tobacco.  Past Surgical History:  Procedure Laterality Date   CHOLECYSTECTOMY  2011   IR ANGIO INTRA EXTRACRAN SEL COM CAROTID INNOMINATE BILAT MOD SED  06/18/2020   IR ANGIO VERTEBRAL SEL SUBCLAVIAN INNOMINATE UNI R MOD SED  06/18/2020   IR ANGIO VERTEBRAL SEL VERTEBRAL UNI L MOD SED  06/18/2020   WISDOM TOOTH EXTRACTION      Allergies  Allergen Reactions   Penicillins Other (See Comments)    Reaction unknown occurred during childhood Has patient had a PCN reaction causing immediate rash, facial/tongue/throat swelling, SOB or lightheadedness with hypotension: Unknown Has patient had a PCN reaction causing severe rash involving mucus membranes or skin necrosis: Unknown Has patient had a PCN reaction that required hospitalization: No Has patient had a PCN reaction occurring within the last 10 years: No If all of the above answers are "NO", then may proceed with Cephalosporin use.  Unsur   Remicade [Infliximab] Other (See Comments)    Reaction unknown   Sulfa Antibiotics Other (See Comments)    Reaction unknown    Immunization History  Administered Date(s) Administered   Fluad Quad(high Dose 65+) 04/23/2020   Influenza Split 04/12/2013, 04/12/2014, 03/14/2015, 04/23/2016   Influenza, High Dose Seasonal PF 04/12/2017, 03/24/2018, 04/13/2019, 03/27/2021   Influenza-Unspecified 04/03/2013, 04/22/2017   Moderna Sars-Covid-2 Vaccination 08/25/2019, 09/22/2019, 03/07/2020   Pfizer Covid-19 Vaccine Bivalent Booster 39yr & up 05/16/2021   Pneumococcal Conjugate-13 04/03/2013, 09/28/2014   Pneumococcal Polysaccharide-23 07/13/2012, 07/24/2013   Td 01/11/2018   Tdap 12/10/2017    Family History  Problem Relation Age of Onset   Asthma Mother    Anemia Mother    Polymyalgia rheumatica Mother    COPD Father    Pulmonary fibrosis Father    Breast cancer Maternal  Grandmother 88     Current Outpatient Medications:    amitriptyline (ELAVIL) 25 MG tablet, Take 25 mg by mouth at bedtime., Disp: , Rfl: 0   Biotin 1000 MCG tablet, Take 1,000 mcg by mouth daily., Disp: , Rfl:    budesonide-formoterol (SYMBICORT) 80-4.5 MCG/ACT inhaler, continuously as needed, Disp: , Rfl:    cholecalciferol (VITAMIN D) 1000 UNITS tablet, Take 1,000 Units by mouth daily., Disp: , Rfl:    Estradiol 10 MCG TABS vaginal tablet, Place 10 mcg vaginally daily as needed (dryness). , Disp: , Rfl:    fluticasone furoate-vilanterol (BREO ELLIPTA) 100-25 MCG/INH AEPB, Inhale 1 puff into the lungs daily., Disp: 60 each, Rfl: 5   gabapentin (NEURONTIN) 300 MG capsule, Take 300 mg by mouth at bedtime., Disp: , Rfl:    gabapentin (NEURONTIN) 300 MG  capsule, Take 300 mg by mouth daily., Disp: , Rfl:    losartan (COZAAR) 25 MG tablet, Take 50 mg by mouth daily., Disp: , Rfl:    methylPREDNISolone (MEDROL) 4 MG tablet, Take 1 tablet (4 mg total) by mouth every other day. Alternate with the 26m dose (Patient taking differently: Take 4 mg by mouth daily.), Disp:  , Rfl:    mometasone-formoterol (DULERA) 100-5 MCG/ACT AERO, Inhale 2 puffs into the lungs 2 (two) times daily., Disp: 39 g, Rfl: 0   Nintedanib (OFEV) 100 MG CAPS, TAKE 1 CAPSULE BY MOUTH TWICE DAILY, Disp: 180 capsule, Rfl: 3   ondansetron (ZOFRAN) 4 MG tablet, Take 1 tablet (4 mg total) by mouth every 8 (eight) hours as needed for nausea or vomiting., Disp: 30 tablet, Rfl: 5   pantoprazole (PROTONIX) 40 MG tablet, Take 1 tablet (40 mg total) by mouth 2 (two) times daily before a meal., Disp: 180 tablet, Rfl: 3   polyethylene glycol (MIRALAX / GLYCOLAX) 17 g packet, Take 17 g by mouth as needed., Disp: , Rfl:    pravastatin (PRAVACHOL) 20 MG tablet, TAKE 1 TABLET IN THE P.M., Disp: 90 tablet, Rfl: 3   riTUXimab (RITUXAN) 100 MG/10ML injection, Inject 100 mg into the vein every 6 (six) months. , Disp: , Rfl:     sulfamethoxazole-trimethoprim (BACTRIM DS) 800-160 MG tablet, TAKE (1) TABLET ON MONDAY, WEDNESDAY AND FRIDAY., Disp: 12 tablet, Rfl: 5   venlafaxine XR (EFFEXOR-XR) 75 MG 24 hr capsule, Take 75 mg by mouth at bedtime. , Disp: , Rfl:       Objective:   Vitals:   03/06/22 1603  BP: 122/64  Pulse: 73  SpO2: 99%  Weight: 132 lb 12.8 oz (60.2 kg)  Height: '5\' 5"'  (1.651 m)    Estimated body mass index is 22.1 kg/m as calculated from the following:   Height as of this encounter: '5\' 5"'  (1.651 m).   Weight as of this encounter: 132 lb 12.8 oz (60.2 kg).  '@WEIGHTCHANGE' @  [facility]  03/06/22 1603  Weight: 132 lb 12.8 oz (60.2 kg)     Physical Exam General: No distress. pleaseat Neuro: Alert and Oriented x 3. GCS 15. Speech normal Psych: Pleasant Resp:  Barrel Chest - no.  Wheeze - no, Crackles - mild at base with some roochii -baseline, No overt respiratory distress CVS: Normal heart sounds. Murmurs - no Ext: Stigmata of Connective Tissue Disease - no HEENT: Normal upper airway. PEERL +. No post nasal drip        Assessment:       ICD-10-CM   1. Interstitial lung disease due to connective tissue disease ([place]  J84.89    M35.9     2. Encounter for long-term current use of high risk medication  Z79.899     3. Asthma, unspecified asthma severity, unspecified whether complicated, unspecified whether persistent  J45.909     4. Balance problem  R26.89     5. Frequent falls  R29.6          Plan:     Patient Instructions     ICD-10-CM   1. Interstitial lung disease due to connective tissue disease ([place]  J84.89    M35.9     2. Encounter for long-term current use of high risk medication  Z79.899     3. Asthma, unspecified asthma severity, unspecified whether complicated, unspecified whether persistent  J45.909     4. Balance problem  R26.89     5.  Frequent falls  R29.6      Interstitial lung disease due to connective tissue disease ([place]) Encounter for  long-term current use of high risk medication  -lung function with continued decline - Tolerating lower dose ofev better   - no diarrhea  - weight loss stopped  - not needing zofran with lower dose    Plan - continue ofev 163m twice daily - can take zofran 454mtwice daily as needed with ofev  - -Continue Rituxan and steroids through the rheumatologist  - but ask if they are willing to change you to ACTEMRA - actemra works for RA joint disease and has shown benefit in another type of fibrosis and so might help your type of fibrosis  - [facility] - continuie  breo 100 dosing daily once with albuterol as needed  - cotninue singulair - do LFT, cbc, bnp, bmet  03/06/2022 or at [id number] visit    Asthma, unspecified asthma severity, unspecified whether complicated, unspecified whether persistent  - stable  Plan  -continue breo scheduled and singular scheduled - flu shot, RSV shot and covid shot in fall 2023  Balance problem Frequent falls   - sorry to hear about your falls x 2 last several months -glad ENT workup normal  Plan PT per PCP  High complex condition requiring intensive therapeutic monitoring  SIGNATURE    Dr. MuBrand MalesM.D., F.C.C.P,  Pulmonary and Critical Care Medicine Staff Physician, Co[place]atsirector - Interstitial Lung Disease  Program  Pulmonary FiOurayt Le[place]udson [id number]: [phone] no answer or between  15:00h - 7:00h: call [phone] Telephone: [phone]  5:56 PM 03/06/2022

## 2022-03-10 ENCOUNTER — Encounter: Payer: Self-pay | Admitting: Physical Therapy

## 2022-03-10 ENCOUNTER — Ambulatory Visit: Payer: Medicare Other | Attending: Otolaryngology | Admitting: Physical Therapy

## 2022-03-10 ENCOUNTER — Other Ambulatory Visit: Payer: Self-pay

## 2022-03-10 DIAGNOSIS — R42 Dizziness and giddiness: Secondary | ICD-10-CM | POA: Insufficient documentation

## 2022-03-10 DIAGNOSIS — R2689 Other abnormalities of gait and mobility: Secondary | ICD-10-CM | POA: Insufficient documentation

## 2022-03-10 DIAGNOSIS — R2681 Unsteadiness on feet: Secondary | ICD-10-CM | POA: Insufficient documentation

## 2022-03-10 NOTE — Therapy (Signed)
OUTPATIENT PHYSICAL THERAPY VESTIBULAR EVALUATION     Patient Name: Stacey [name] MRN: [id number] DOB:1941/06/08, 81 y.o., female Today's Date: 03/10/2022  PCP: Shon Baton, MD REFERRING PROVIDER: Raylene Miyamoto, MD   PT End of Session - 03/10/22 1240     Visit Number 1    Number of Visits 10    Date for PT Re-Evaluation 04/10/22    Authorization Type Medicare/BCBS    PT Start Time 1020    PT Stop Time 1105    PT Time Calculation (min) 45 min    Activity Tolerance Patient tolerated treatment well    Behavior During Therapy WFL for tasks assessed/performed             Past Medical History:  Diagnosis Date   Abnormal finding of blood chemistry    Asthma    H/O measles    H/O varicella    Hypertension    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[place])    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   Lichen sclerosus 88/82/80   Asymptomatic   Low iron    Mitral valve prolapse    Osteoarthritis    Osteoporosis    Pneumonia    Post herpetic neuralgia    Rheumatoid arthritis(714.0)    Yeast infection    Past Surgical History:  Procedure Laterality Date   CHOLECYSTECTOMY  2011   IR ANGIO INTRA EXTRACRAN SEL COM CAROTID INNOMINATE BILAT MOD SED  06/18/2020   IR ANGIO VERTEBRAL SEL SUBCLAVIAN INNOMINATE UNI R MOD SED  06/18/2020   IR ANGIO VERTEBRAL SEL VERTEBRAL UNI L MOD SED  06/18/2020   WISDOM TOOTH EXTRACTION     Patient Active Problem List   Diagnosis Date Noted   Chronic cough 06/12/2020   Constipation    CAP (community acquired pneumonia) 02/09/2020   Acute on chronic respiratory failure with hypoxia ([place]) 07/06/2019   Fall at home, initial encounter 07/06/2019   Orthostatic hypotension 07/06/2019   Rib fractures 07/04/2019   Pneumonia due to COVID-19 virus 06/11/2019   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[place]) 05/07/2019   Acute asthma exacerbation 12/18/2018   Elevated troponin 12/18/2018   Acute bronchitis 08/30/2018   Chronic respiratory failure with hypoxia ([place]) 04/06/2018    Essential hypertension 03/24/2018   Depression 03/24/2018   Asthma exacerbation 03/23/2018   DOE (dyspnea on exertion) 03/21/2018   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 esophagitis 03/21/2018   Chronic lung disease    Hypoxia    Dyspnea 01/23/2018   ILD (interstitial lung disease) ([place]) 01/23/2018   Claw toe, acquired, 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   Restrictive lung disease 07/18/2013   Multiple pulmonary nodules/RA lung dz  05/29/2013   Cough variant asthma vs uacs  05/29/2013   Rheumatoid arthritis ([place]) 01/13/2012   Osteopenia 03/49/1791   Lichen sclerosus et atrophicus of the vulva 01/13/2012   Asthma 01/13/2012    ONSET DATE: 03/04/2022 (MD referral)  REFERRING DIAG: R42 (ICD-10-CM) - Dizziness  THERAPY DIAG:  Unsteadiness on feet  Other abnormalities of gait and mobility  Dizziness and giddiness  Rationale for Evaluation and Treatment Rehabilitation  SUBJECTIVE:   SUBJECTIVE STATEMENT: Have had 2 falls in past 6 monhts.  Feel dizzy, but it's more that my balance is really off.  Feel like it's hard to walk a straight line.  Some days better than others.  Saw Dr. Benjamine Mola and he ruled out inner ear. Pt accompanied by: self  PERTINENT HISTORY: See above for PMH   PAIN:  Are you having pain? No  PRECAUTIONS: None  FALLS: Has patient  fallen in last 6 months? Yes. 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 No acute intracranial abnormality. 2. No evidence of acute cervical spine fracture or traumatic malalignment.  COGNITION: Overall cognitive status:  Within functional limits for tasks assessed   Cervical ROM:  WFL  Active A/PROM (deg) eval  Flexion   Extension   Right lateral flexion   Left lateral flexion   Right rotation   Left rotation   (Blank rows = not tested)  STRENGTH: Decreased functional strength, per 5x sit<>stand  TRANSFERS: Assistive device utilized: None  Sit to stand: Modified independence Stand to sit: Modified independence  GAIT: Gait pattern: step through pattern and wide BOS Distance walked: 60 ft Assistive device utilized: None Level of assistance: SBA Comments: With turn around furniture, mild LOB towards R side, able to self-recover  FUNCTIONAL TESTs:  5 times sit to stand: 22.22 Dynamic Gait Index: NT due to time constraints  M-CTSIB  Condition 1: Firm Surface, EO 30 Sec, Normal Sway  Condition 2: Firm Surface, EC 30 Sec, Mild Sway  Condition 3: Foam Surface, EO 30 Sec, Moderate Sway  Condition 4: Foam Surface, EC 9.31 Sec, Severe Sway    PATIENT SURVEYS:  FOTO Intake score = 50; predicted score = 58   VESTIBULAR ASSESSMENT   GENERAL OBSERVATION: Unsteadiness, cautious with gait    SYMPTOM BEHAVIOR:   Subjective history: Reports that her balance has been off for several years.  Pretty immediate symptoms of imbalance, need to grab for support   Non-Vestibular symptoms:  falls   Type of dizziness: Diplopia, Imbalance (Disequilibrium), Spinning/Vertigo, and Unsteady with head/body turns   Frequency: several times per week    Duration: approx 5 minutes   Aggravating factors: Induced by motion: occur when walking, bending down to the ground, turning body quickly, and turning head quickly   Relieving factors:  sit down   Progression of symptoms: worse   OCULOMOTOR EXAM:   Ocular Alignment: normal   Ocular ROM:  slightly limited R and L; moves head with eye motion initially   Spontaneous Nystagmus: absent   Gaze-Induced Nystagmus: absent   Smooth Pursuits: intact   Saccades:  intact   Convergence/Divergence: decreased convergence noted     VESTIBULAR - OCULAR REFLEX:    Slow VOR: Normal   VOR Cancellation: Comment: limited head turns to keep eyes focused on target   Head-Impulse Test: HIT Right: negative HIT Left: positive, no c/o   Dynamic Visual Acuity:  Attempted, but not able to be assessed, due to pt not having her glasses on      MOTION SENSITIVITY:    Motion Sensitivity Quotient  Intensity: 0 = none, 1 = Lightheaded, 2 = Mild, 3 = Moderate, 4 = Severe, 5 = Vomiting  Intensity  1. Sitting to supine   2. Supine to L side   3. Supine to R side   4. Supine to sitting   5. L Hallpike-Dix   6. Up from L    7. R Hallpike-Dix   8. Up from  R    9. Sitting, head  tipped to L knee   10. Head up from L  knee   11. Sitting, head  tipped to R knee   12. Head up from R  knee   13. Sitting head turns x5   14.Sitting head nods x5   15. In stance, 180  turn to L    16. In stance, 180  turn to R      PATIENT EDUCATION: Education details: Eval results, POC Person educated: Patient Education method: Explanation Education comprehension: verbalized understanding   GOALS: Goals reviewed with patient? Yes  SHORT TERM GOALS: = LTGs   LONG TERM GOALS: Target date: 04/10/2022  Pt will be independent with HEP for improved balance, transfers, gait and overall c/o decreased dizziness Baseline:  Goal status: INITIAL  2.  Pt will improve 5x sit<>stand to less than or equal to 15 sec to demonstrate improved functional strength and transfer efficiency. Baseline: 22.22 sec Goal status: INITIAL  3.  Pt will Condition 4 on MCTSIB, for 30 seconds with mild-moderate sway, to demonstrate improved vestibular system use for balance. Baseline: 9.31 sec prior to opening eyes Goal status: INITIAL  4. Pt will improve DGI score by at least 4 points to decrease fall risk. Baseline: Not yet assessed Goal status: INITIAL  5.  Pt will improve FOTO score to 58  for improved overall functional mobility and decreased unsteadiness. Baseline:  Goal status: INITIAL  ASSESSMENT:  CLINICAL IMPRESSION: Patient is an 81 y.o. female who was seen today for physical therapy evaluation and treatment for dizziness and unsteadiness.   She reports her balance is not good at all; she has had 2 falls in the past 6 months.  She presents today with decreased balance, decreased vestibular use for balance, decreased functional strength, decreased stability with gait.  Based on subjective reports today, did not assess positional vertigo, but will continue to monitor.  She will benefit from skilled PT towards goals to address the above stated deficits to decrease fall risk and improve overall functional mobility.  OBJECTIVE IMPAIRMENTS Abnormal gait, decreased balance, decreased knowledge of use of DME, decreased mobility, difficulty walking, decreased strength, and dizziness.   ACTIVITY LIMITATIONS transfers and locomotion level  PARTICIPATION LIMITATIONS: shopping and community activity  PERSONAL FACTORS 3+ comorbidities: See above  are also affecting patient's functional outcome.   REHAB POTENTIAL: Good  CLINICAL DECISION MAKING: Evolving/moderate complexity  EVALUATION COMPLEXITY: Moderate   PLAN: PT FREQUENCY: 2x/week  PT DURATION: other: 5 weeks, including eval week  PLANNED INTERVENTIONS: Therapeutic exercises, Therapeutic activity, Neuromuscular re-education, Balance training, Gait training, Patient/Family education, Self Care, Joint mobilization, Vestibular training, Canalith repositioning, and DME instructions  PLAN FOR NEXT SESSION: Assess DGI; Assess BPPV if needed; work on corner balance exercises and on compliant surfaces; initiate HEP   Earlin Sweeden W., PT 03/10/2022, 12:42 PM   [facility] at [facility] [address] [place], [place] [address] Phone # [phone] Fax # [phone]

## 2022-03-12 ENCOUNTER — Ambulatory Visit: Payer: Medicare Other

## 2022-03-12 DIAGNOSIS — R2681 Unsteadiness on feet: Secondary | ICD-10-CM | POA: Diagnosis not present

## 2022-03-12 DIAGNOSIS — R42 Dizziness and giddiness: Secondary | ICD-10-CM | POA: Diagnosis not present

## 2022-03-12 DIAGNOSIS — R2689 Other abnormalities of gait and mobility: Secondary | ICD-10-CM | POA: Diagnosis not present

## 2022-03-12 NOTE — Therapy (Signed)
OUTPATIENT PHYSICAL THERAPY VESTIBULAR/NEURO TREATMENT     Patient Name: Stacey [name] MRN: [id number] DOB:09-05-1940, 81 y.o., female Today's Date: 03/12/2022  PCP: Shon Baton, MD REFERRING PROVIDER: Raylene Miyamoto, MD   PT End of Session - 03/12/22 1015     Visit Number 2    Number of Visits 10    Date for PT Re-Evaluation 04/10/22    Authorization Type Medicare/BCBS    PT Start Time 1015    PT Stop Time 1100    PT Time Calculation (min) 45 min    Activity Tolerance Patient tolerated treatment well    Behavior During Therapy [facility] for tasks assessed/performed             Past Medical History:  Diagnosis Date   Abnormal finding of blood chemistry    Asthma    H/O measles    H/O varicella    Hypertension    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[place])    Leukoplakia of vulva 40/98/11   Lichen sclerosus 91/47/82   Asymptomatic   Low iron    Mitral valve prolapse    Osteoarthritis    Osteoporosis    Pneumonia    Post herpetic neuralgia    Rheumatoid arthritis(714.0)    Yeast infection    Past Surgical History:  Procedure Laterality Date   CHOLECYSTECTOMY  2011   IR ANGIO INTRA EXTRACRAN SEL COM CAROTID INNOMINATE BILAT MOD SED  06/18/2020   IR ANGIO VERTEBRAL SEL SUBCLAVIAN INNOMINATE UNI R MOD SED  06/18/2020   IR ANGIO VERTEBRAL SEL VERTEBRAL UNI L MOD SED  06/18/2020   WISDOM TOOTH EXTRACTION     Patient Active Problem List   Diagnosis Date Noted   Chronic cough 06/12/2020   Constipation    CAP (community acquired pneumonia) 02/09/2020   Acute on chronic respiratory failure with hypoxia ([place]) 07/06/2019   Fall at home, initial encounter 07/06/2019   Orthostatic hypotension 07/06/2019   Rib fractures 07/04/2019   Pneumonia due to COVID-19 virus 06/11/2019   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[place]) 05/07/2019   Acute asthma exacerbation 12/18/2018   Elevated troponin 12/18/2018   Acute bronchitis 08/30/2018   Chronic respiratory failure with hypoxia ([place]) 04/06/2018    Essential hypertension 03/24/2018   Depression 03/24/2018   Asthma exacerbation 03/23/2018   DOE (dyspnea on exertion) 03/21/2018   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 esophagitis 03/21/2018   Chronic lung disease    Hypoxia    Dyspnea 01/23/2018   ILD (interstitial lung disease) ([place]) 01/23/2018   Claw toe, acquired, 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   Restrictive lung disease 07/18/2013   Multiple pulmonary nodules/RA lung dz  05/29/2013   Cough variant asthma vs uacs  05/29/2013   Rheumatoid arthritis ([place]) 01/13/2012   Osteopenia 95/62/1308   Lichen sclerosus et atrophicus of the vulva 01/13/2012   Asthma 01/13/2012    ONSET DATE: 03/04/2022 (MD referral)  REFERRING DIAG: R42 (ICD-10-CM) - Dizziness  THERAPY DIAG:  Unsteadiness on feet  Other abnormalities of gait and mobility  Dizziness and giddiness  Rationale for Evaluation and Treatment Rehabilitation  SUBJECTIVE:   SUBJECTIVE STATEMENT: Unsteadiness and feeling of lightheaded and dizzy when changing positions and change of direction w/ head turns.  Having double vision which is not new but comes and goes in severity.  Where prism glasses for this issue Pt accompanied by: self  PERTINENT HISTORY: See above for PMH   PAIN:  Are you having pain? No  PRECAUTIONS: None  FALLS: Has patient fallen in last 6 months? Yes. Number of  falls 2  LIVING ENVIRONMENT: Lives with: lives with their spouse Lives in: House/apartment Stairs: Yes: Internal: 2nd leve, but bedroom on first floor steps; has elevator and External: 2 steps; none Has following equipment at home: Single point cane, but does not use  PLOF: Independent  PATIENT GOALS To feel better and get my balance back  OBJECTIVE:   TODAY'S TREATMENT: 03/12/22 Activity Comments  DGI 11/24  VOR x 1 sitting and standing Head rotation right requires corrective saccade. Used prism glasses for repeat and notices less strain but unable to see if corrective saccade is still occuring  Corner balance Feet  together EO/EC Feet together head turns             HEP Access Code: [email or web address] URL: [email or web address] Date: 03/12/2022 Prepared by: Sherlyn Lees  Exercises - Seated Gaze Stabilization with Head Rotation  - 1 x daily - 7 x weekly - 3-5 sets - 30 sec hold - Standing Near Stance in Corner  - 1 x daily - 7 x weekly - 1-3 sets - 15-30 sec hold - Corner Balance Feet Together With Eyes Closed  - 1 x daily - 7 x weekly - 1-3 sets - 15-30 sec hold - Corner Balance Feet Apart: Eyes Open With Head Turns  - 1 x daily - 7 x weekly - 5-10 reps   DIAGNOSTIC FINDINGS:  Per CT 12/2021:  Disc levels:  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 No acute intracranial abnormality. 2. No evidence of acute cervical spine fracture or traumatic malalignment.  COGNITION: Overall cognitive status: Within functional limits for tasks assessed   Cervical ROM:  WFL  Active A/PROM (deg) eval  Flexion   Extension   Right lateral flexion   Left lateral flexion   Right rotation   Left rotation   (Blank rows = not tested)  STRENGTH: Decreased functional strength, per 5x sit<>stand  TRANSFERS: Assistive device utilized: None  Sit to stand: Modified independence Stand to sit: Modified independence  GAIT: Gait pattern: step through pattern and wide BOS Distance walked: 60 ft Assistive device utilized: None Level of assistance: SBA Comments: With turn around furniture, mild LOB towards R side, able to self-recover  FUNCTIONAL TESTs:  5 times sit to stand: 22.22 Dynamic Gait Index: 11/24  M-CTSIB  Condition 1: Firm Surface, EO 30 Sec, Normal Sway  Condition 2: Firm Surface, EC 30 Sec, Mild Sway  Condition 3: Foam Surface, EO 30 Sec, Moderate Sway  Condition 4: Foam Surface, EC 9.31 Sec, Severe Sway    PATIENT SURVEYS:  FOTO Intake score = 50; predicted score = 58   VESTIBULAR ASSESSMENT   GENERAL OBSERVATION: Unsteadiness, cautious with  gait    SYMPTOM BEHAVIOR:   Subjective history: Reports that her balance has been off for several years.  Pretty immediate symptoms of imbalance, need to grab for support   Non-Vestibular symptoms:  falls   Type of dizziness: Diplopia, Imbalance (Disequilibrium), Spinning/Vertigo, and Unsteady with head/body turns   Frequency: several times per week    Duration: approx 5 minutes   Aggravating factors: Induced by motion: occur when walking, bending down to the ground, turning body quickly, and turning head quickly   Relieving factors:  sit down   Progression of symptoms: worse   OCULOMOTOR EXAM:   Ocular Alignment: normal   Ocular ROM:  slightly limited R and L; moves head with eye motion initially   Spontaneous Nystagmus: absent   Gaze-Induced Nystagmus: absent  Smooth Pursuits: intact   Saccades: intact   Convergence/Divergence: decreased convergence noted     VESTIBULAR - OCULAR REFLEX:    Slow VOR: Normal   VOR Cancellation: Comment: limited head turns to keep eyes focused on target   Head-Impulse Test: HIT Right: negative HIT Left: positive, no c/o   Dynamic Visual Acuity:  Attempted, but not able to be assessed, due to pt not having her glasses on      MOTION SENSITIVITY:    Motion Sensitivity Quotient  Intensity: 0 = none, 1 = Lightheaded, 2 = Mild, 3 = Moderate, 4 = Severe, 5 = Vomiting  Intensity  1. Sitting to supine   2. Supine to L side   3. Supine to R side   4. Supine to sitting   5. L Hallpike-Dix   6. Up from L    7. R Hallpike-Dix   8. Up from R    9. Sitting, head  tipped to L knee   10. Head up from L  knee   11. Sitting, head  tipped to R knee   12. Head up from R  knee   13. Sitting head turns x5   14.Sitting head nods x5   15. In stance, 180  turn to L    16. In stance, 180  turn to R      PATIENT EDUCATION: Education details: Eval results, POC Person educated: Patient Education method: Explanation Education comprehension:  verbalized understanding   GOALS: Goals reviewed with patient? Yes  SHORT TERM GOALS: = LTGs   LONG TERM GOALS: Target date: 04/10/2022  Pt will be independent with HEP for improved balance, transfers, gait and overall c/o decreased dizziness Baseline:  Goal status: INITIAL  2.  Pt will improve 5x sit<>stand to less than or equal to 15 sec to demonstrate improved functional strength and transfer efficiency. Baseline: 22.22 sec Goal status: INITIAL  3.  Pt will Condition 4 on MCTSIB, for 30 seconds with mild-moderate sway, to demonstrate improved vestibular system use for balance. Baseline: 9.31 sec prior to opening eyes Goal status: INITIAL  4. Pt will improve DGI score by at least 4 points to decrease fall risk. Baseline: Not yet assessed Goal status: INITIAL  5.  Pt will improve FOTO score to 58 for improved overall functional mobility and decreased unsteadiness. Baseline:  Goal status: INITIAL  ASSESSMENT:  CLINICAL IMPRESSION: High risk for falls per DGI score 11/24. Demonstrates difficulty with gaze stability in horizontal with notable deviation when turning head to the right requiring corrective saccade for majority of trials.  Difficulty maintaining speed with this movement.  Pt notes hx of double vision and using prism glasses to correct and this may also be contributing. Asked pt to bring specilaized glasses to re-assess these items with corrected vision  OBJECTIVE IMPAIRMENTS Abnormal gait, decreased balance, decreased knowledge of use of DME, decreased mobility, difficulty walking, decreased strength, and dizziness.   ACTIVITY LIMITATIONS transfers and locomotion level  PARTICIPATION LIMITATIONS: shopping and community activity  PERSONAL FACTORS 3+ comorbidities: See above  are also affecting patient's functional outcome.   REHAB POTENTIAL: Good  CLINICAL DECISION MAKING: Evolving/moderate complexity  EVALUATION COMPLEXITY: Moderate   PLAN: PT FREQUENCY:  2x/week  PT DURATION: other: 5 weeks, including eval week  PLANNED INTERVENTIONS: Therapeutic exercises, Therapeutic activity, Neuromuscular re-education, Balance training, Gait training, Patient/Family education, Self Care, Joint mobilization, Vestibular training, Canalith repositioning, and DME instructions  PLAN FOR NEXT SESSION: Assess DGI; Assess BPPV if needed; work  on corner balance exercises and on compliant surfaces; initiate HEP   Toniann Fail, PT 03/12/2022, 10:15 AM   [facility] at [facility], [place], [place] [address] Phone # [phone] Fax # [phone]

## 2022-03-17 ENCOUNTER — Other Ambulatory Visit: Payer: Self-pay | Admitting: Internal Medicine

## 2022-03-17 NOTE — Therapy (Signed)
OUTPATIENT PHYSICAL THERAPY VESTIBULAR/NEURO TREATMENT     Patient Name: Stacey [name] MRN: [id number] DOB:[birth year], 81 y.o., female Today's Date: 03/18/2022  PCP: Shon Baton, MD REFERRING PROVIDER: Raylene Miyamoto, MD   PT End of Session - 03/18/22 1057     Visit Number 3    Number of Visits 10    Date for PT Re-Evaluation 04/10/22    Authorization Type Medicare/BCBS    PT Start Time 1017    PT Stop Time 1055    PT Time Calculation (min) 38 min    Equipment Utilized During Treatment Gait belt    Activity Tolerance Patient tolerated treatment well    Behavior During Therapy WFL for tasks assessed/performed              Past Medical History:  Diagnosis Date   Abnormal finding of blood chemistry    Asthma    H/O measles    H/O varicella    Hypertension    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[place])    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   Lichen sclerosus 28/41/32   Asymptomatic   Low iron    Mitral valve prolapse    Osteoarthritis    Osteoporosis    Pneumonia    Post herpetic neuralgia    Rheumatoid arthritis(714.0)    Yeast infection    Past Surgical History:  Procedure Laterality Date   CHOLECYSTECTOMY  2011   IR ANGIO INTRA EXTRACRAN SEL COM CAROTID INNOMINATE BILAT MOD SED  06/18/2020   IR ANGIO VERTEBRAL SEL SUBCLAVIAN INNOMINATE UNI R MOD SED  06/18/2020   IR ANGIO VERTEBRAL SEL VERTEBRAL UNI L MOD SED  06/18/2020   WISDOM TOOTH EXTRACTION     Patient Active Problem List   Diagnosis Date Noted   Chronic cough 06/12/2020   Constipation    CAP (community acquired pneumonia) 02/09/2020   Acute on chronic respiratory failure with hypoxia ([place]) 07/06/2019   Fall at home, initial encounter 07/06/2019   Orthostatic hypotension 07/06/2019   Rib fractures 07/04/2019   Pneumonia due to COVID-19 virus 06/11/2019   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[place]) 05/07/2019   Acute asthma exacerbation 12/18/2018   Elevated troponin 12/18/2018   Acute bronchitis 08/30/2018   Chronic  respiratory failure with hypoxia ([place]) 04/06/2018   Essential hypertension 03/24/2018   Depression 03/24/2018   Asthma exacerbation 03/23/2018   DOE (dyspnea on exertion) 03/21/2018   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 esophagitis 03/21/2018   Chronic lung disease    Hypoxia    Dyspnea 01/23/2018   ILD (interstitial lung disease) ([place]) 01/23/2018   Claw toe, acquired, 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   Restrictive lung disease 07/18/2013   Multiple pulmonary nodules/RA lung dz  05/29/2013   Cough variant asthma vs uacs  05/29/2013   Rheumatoid arthritis ([place]) 01/13/2012   Osteopenia 44/07/270   Lichen sclerosus et atrophicus of the vulva 01/13/2012   Asthma 01/13/2012    ONSET DATE: 03/04/2022 (MD referral)  REFERRING DIAG: R42 (ICD-10-CM) - Dizziness  THERAPY DIAG:  Unsteadiness on feet  Other abnormalities of gait and mobility  Dizziness and giddiness  Rationale for Evaluation and Treatment Rehabilitation  SUBJECTIVE:   SUBJECTIVE STATEMENT: Has been working on HEP and notices that when she does the exercise when she turns her head, she gets way off balance.  Pt accompanied by: self  PERTINENT HISTORY: See above for PMH   PAIN:  Are you having pain? No  PRECAUTIONS: None  FALLS: Has patient fallen in last 6 months? Yes. Number of falls 2  LIVING  ENVIRONMENT: Lives with: lives with their spouse Lives in: House/apartment Stairs: Yes: Internal: 2nd leve, but bedroom on first floor steps; has elevator and External: 2 steps; none Has following equipment at home: Single point cane, but does not use  PLOF: Independent  PATIENT GOALS To feel better and get my balance back  OBJECTIVE:     TODAY'S TREATMENT: 03/17/22 Activity Comments  Romberg EC 30" LOB at the end  Standing wide EC and EO+ head turns 2x30" each   Mild-mod sway; c/o mild dizziness at quicker pace with EO  Romberg 30" Moderate sway  Sitting VOR horizontal 2x30" Correction to decrease excursion of head movement to help  maintain gaze stability; more trouble with R side; denies blurred vision or dizziness   Gait training with SPC 200 ft Edu and assist on cane sequencing; multiple episodes of LOB and scissoring/cross stepping requiring mod A  /2 turns to targets  Cueing to correct small shuffling steps; able to perform with improved stability when prompted   tandem  in II bars 4x More steady with 1 UE support     PATIENT EDUCATION: Education details: review of HEP; encouraged use of AD with ambulation for max safety  Person educated: Patient Education method: Explanation, Demonstration, Tactile cues, Verbal cues, and Handouts Education comprehension: verbalized understanding and returned demonstration   HEP Access Code: [email or web address] URL: [email or web address] Date: 03/12/2022 Prepared by: Sherlyn Lees  Exercises - Seated Gaze Stabilization with Head Rotation  - 1 x daily - 7 x weekly - 3-5 sets - 30 sec hold - Standing Near Stance in Corner  - 1 x daily - 7 x weekly - 1-3 sets - 15-30 sec hold - Corner Balance Feet Together With Eyes Closed  - 1 x daily - 7 x weekly - 1-3 sets - 15-30 sec hold - Corner Balance Feet Apart: Eyes Open With Head Turns  - 1 x daily - 7 x weekly - 5-10 reps   Below measures were taken at time of initial evaluation unless otherwise specified:  DIAGNOSTIC FINDINGS:  Per CT 12/2021:  Disc levels:  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 No acute intracranial abnormality. 2. No evidence of acute cervical spine fracture or traumatic malalignment.  COGNITION: Overall cognitive status: Within functional limits for tasks assessed   Cervical ROM:  WFL  Active A/PROM (deg) eval  Flexion   Extension   Right lateral flexion   Left lateral flexion   Right rotation   Left rotation   (Blank rows = not tested)  STRENGTH: Decreased functional strength, per 5x sit<>stand  TRANSFERS: Assistive device utilized: None  Sit to stand:  Modified independence Stand to sit: Modified independence  GAIT: Gait pattern: step through pattern and wide BOS Distance walked: 60 ft Assistive device utilized: None Level of assistance: SBA Comments: With turn around furniture, mild LOB towards R side, able to self-recover  FUNCTIONAL TESTs:  5 times sit to stand: 22.22 Dynamic Gait Index: 11/24  M-CTSIB  Condition 1: Firm Surface, EO 30 Sec, Normal Sway  Condition 2: Firm Surface, EC 30 Sec, Mild Sway  Condition 3: Foam Surface, EO 30 Sec, Moderate Sway  Condition 4: Foam Surface, EC 9.31 Sec, Severe Sway    PATIENT SURVEYS:  FOTO Intake score = 50; predicted score = 58   VESTIBULAR ASSESSMENT   GENERAL OBSERVATION: Unsteadiness, cautious with gait    SYMPTOM BEHAVIOR:   Subjective history: Reports that her balance has been off for several  years.  Marchia Meiers immediate symptoms of imbalance, need to grab for support   Non-Vestibular symptoms:  falls   Type of dizziness: Diplopia, Imbalance (Disequilibrium), Spinning/Vertigo, and Unsteady with head/body turns   Frequency: several times per week    Duration: approx 5 minutes   Aggravating factors: Induced by motion: occur when walking, bending down to the ground, turning body quickly, and turning head quickly   Relieving factors:  sit down   Progression of symptoms: worse   OCULOMOTOR EXAM:   Ocular Alignment: normal   Ocular ROM:  slightly limited R and L; moves head with eye motion initially   Spontaneous Nystagmus: absent   Gaze-Induced Nystagmus: absent   Smooth Pursuits: intact   Saccades: intact   Convergence/Divergence: decreased convergence noted     VESTIBULAR - OCULAR REFLEX:    Slow VOR: Normal   VOR Cancellation: Comment: limited head turns to keep eyes focused on target   Head-Impulse Test: HIT Right: negative HIT Left: positive, no c/o   Dynamic Visual Acuity:  Attempted, but not able to be assessed, due to pt not having her glasses on      MOTION  SENSITIVITY:    Motion Sensitivity Quotient  Intensity: 0 = none, 1 = Lightheaded, 2 = Mild, 3 = Moderate, 4 = Severe, 5 = Vomiting  Intensity  1. Sitting to supine   2. Supine to L side   3. Supine to R side   4. Supine to sitting   5. L Hallpike-Dix   6. Up from L    7. R Hallpike-Dix   8. Up from R    9. Sitting, head  tipped to L knee   10. Head up from L  knee   11. Sitting, head  tipped to R knee   12. Head up from R  knee   13. Sitting head turns x5   14.Sitting head nods x5   15. In stance, 180  turn to L    16. In stance, 180  turn to R      PATIENT EDUCATION: Education details: Eval results, POC Person educated: Patient Education method: Explanation Education comprehension: verbalized understanding   GOALS: Goals reviewed with patient? Yes  SHORT TERM GOALS: = LTGs   LONG TERM GOALS: Target date: 04/10/2022  Pt will be independent with HEP for improved balance, transfers, gait and overall c/o decreased dizziness Baseline:  Goal status: IN PROGRESS  2.  Pt will improve 5x sit<>stand to less than or equal to 15 sec to demonstrate improved functional strength and transfer efficiency. Baseline: 22.22 sec Goal status: IN PROGRESS  3.  Pt will Condition 4 on MCTSIB, for 30 seconds with mild-moderate sway, to demonstrate improved vestibular system use for balance. Baseline: 9.31 sec prior to opening eyes Goal status: IN PROGRESS  4. Pt will improve DGI score by at least 4 points to decrease fall risk. Baseline: 11/24 03/12/22 Goal status: IN PROGRESS  5.  Pt will improve FOTO score to 58 for improved overall functional mobility and decreased unsteadiness. Baseline:  Goal status: IN PROGRESS  ASSESSMENT:  CLINICAL IMPRESSION: Patient arrived to session with report of some difficulty with HEP. Reviewed exercises and encouraged patient to perform exercises in corner for max safety. Corrected technique with VOR with good carryover. Initiated gait  training with SPC with patient demonstrating path deviation and scissoring resulting in LOB. Patient similarly off balance without AD.  Advised patient to use AD with ambulation d/t imbalance and hx of falls.  Patient agreeable. Patient reported understanding of all edu provided and without complaints at end of session.    OBJECTIVE IMPAIRMENTS Abnormal gait, decreased balance, decreased knowledge of use of DME, decreased mobility, difficulty walking, decreased strength, and dizziness.   ACTIVITY LIMITATIONS transfers and locomotion level  PARTICIPATION LIMITATIONS: shopping and community activity  PERSONAL FACTORS 3+ comorbidities: See above  are also affecting patient's functional outcome.   REHAB POTENTIAL: Good  CLINICAL DECISION MAKING: Evolving/moderate complexity  EVALUATION COMPLEXITY: Moderate   PLAN: PT FREQUENCY: 2x/week  PT DURATION: other: 5 weeks, including eval week  PLANNED INTERVENTIONS: Therapeutic exercises, Therapeutic activity, Neuromuscular re-education, Balance training, Gait training, Patient/Family education, Self Care, Joint mobilization, Vestibular training, Canalith repositioning, and DME instructions  PLAN FOR NEXT SESSION: gait training with AD- SPC vs. B walking poles? Assess BPPV if needed; work on corner balance exercises and on compliant surfaces; initiate HEP   Janene Harvey, PT, DPT 03/18/22 10:59 AM  [facility] [place], [place], [place] [address] Phone # [phone] Fax # [phone]

## 2022-03-18 ENCOUNTER — Ambulatory Visit: Payer: Medicare Other | Attending: Otolaryngology | Admitting: Physical Therapy

## 2022-03-18 DIAGNOSIS — R2689 Other abnormalities of gait and mobility: Secondary | ICD-10-CM | POA: Insufficient documentation

## 2022-03-18 DIAGNOSIS — R2681 Unsteadiness on feet: Secondary | ICD-10-CM | POA: Insufficient documentation

## 2022-03-18 DIAGNOSIS — R42 Dizziness and giddiness: Secondary | ICD-10-CM | POA: Diagnosis not present

## 2022-03-19 NOTE — Therapy (Signed)
OUTPATIENT PHYSICAL THERAPY VESTIBULAR/NEURO TREATMENT     Patient Name: Stacey [name] MRN: [id number] DOB:1940-09-24, 81 y.o., female Today's Date: 03/20/2022  PCP: Shon Baton, MD REFERRING PROVIDER: Raylene Miyamoto, MD   PT End of Session - 03/20/22 1035     Visit Number 4    Number of Visits 10    Date for PT Re-Evaluation 04/10/22    Authorization Type Medicare/BCBS    PT Start Time 0849    PT Stop Time 0929    PT Time Calculation (min) 40 min    Equipment Utilized During Treatment Gait belt    Activity Tolerance Patient tolerated treatment well    Behavior During Therapy WFL for tasks assessed/performed               Past Medical History:  Diagnosis Date   Abnormal finding of blood chemistry    Asthma    H/O measles    H/O varicella    Hypertension    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[place])    Leukoplakia of vulva 11/57/26   Lichen sclerosus 20/35/59   Asymptomatic   Low iron    Mitral valve prolapse    Osteoarthritis    Osteoporosis    Pneumonia    Post herpetic neuralgia    Rheumatoid arthritis(714.0)    Yeast infection    Past Surgical History:  Procedure Laterality Date   CHOLECYSTECTOMY  2011   IR ANGIO INTRA EXTRACRAN SEL COM CAROTID INNOMINATE BILAT MOD SED  06/18/2020   IR ANGIO VERTEBRAL SEL SUBCLAVIAN INNOMINATE UNI R MOD SED  06/18/2020   IR ANGIO VERTEBRAL SEL VERTEBRAL UNI L MOD SED  06/18/2020   WISDOM TOOTH EXTRACTION     Patient Active Problem List   Diagnosis Date Noted   Chronic cough 06/12/2020   Constipation    CAP (community acquired pneumonia) 02/09/2020   Acute on chronic respiratory failure with hypoxia ([place]) 07/06/2019   Fall at home, initial encounter 07/06/2019   Orthostatic hypotension 07/06/2019   Rib fractures 07/04/2019   Pneumonia due to COVID-19 virus 06/11/2019   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[place]) 05/07/2019   Acute asthma exacerbation 12/18/2018   Elevated troponin 12/18/2018   Acute bronchitis 08/30/2018   Chronic  respiratory failure with hypoxia ([place]) 04/06/2018   Essential hypertension 03/24/2018   Depression 03/24/2018   Asthma exacerbation 03/23/2018   DOE (dyspnea on exertion) 03/21/2018   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 esophagitis 03/21/2018   Chronic lung disease    Hypoxia    Dyspnea 01/23/2018   ILD (interstitial lung disease) ([place]) 01/23/2018   Claw toe, acquired, 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   Restrictive lung disease 07/18/2013   Multiple pulmonary nodules/RA lung dz  05/29/2013   Cough variant asthma vs uacs  05/29/2013   Rheumatoid arthritis ([place]) 01/13/2012   Osteopenia 74/16/3845   Lichen sclerosus et atrophicus of the vulva 01/13/2012   Asthma 01/13/2012    ONSET DATE: 03/04/2022 (MD referral)  REFERRING DIAG: R42 (ICD-10-CM) - Dizziness  THERAPY DIAG:  Unsteadiness on feet  Other abnormalities of gait and mobility  Dizziness and giddiness  Rationale for Evaluation and Treatment Rehabilitation  SUBJECTIVE:   SUBJECTIVE STATEMENT: No new issues. Brought in cane but it's not as good as the other one.  Pt accompanied by: self  PERTINENT HISTORY: See above for PMH   PAIN:  Are you having pain? Yes: NPRS scale: 4/10 Pain location: L thigh Pain description: tingling  Aggravating factors: at rest Relieving factors: nothing  PRECAUTIONS: None  FALLS: Has patient fallen  in last 6 months? Yes. Number of falls 2  LIVING ENVIRONMENT: Lives with: lives with their spouse Lives in: House/apartment Stairs: Yes: Internal: 2nd leve, but bedroom on first floor steps; has elevator and External: 2 steps; none Has following equipment at home: Single point cane, but does not use  PLOF: Independent  PATIENT GOALS To feel better and get my balance back  OBJECTIVE:      TODAY'S TREATMENT: 03/19/22 Activity Comments  Gait training with SPC and quad tip cane 2x 154f Cues to slow down; path deviation and unsteadiness still evident; cueing for proper AD sequencing with turns   Gait training with B  walking poles 2x 1480f2-3 episodes of LOB requiring min-mod A; heavy cues to slow down and reset pattern  romberg 2x30" Moderate sway  standing wide EC 2x30" Mild-mod sway  standing on foam 2x30" Mild-mod sway, however with severe LOB when trailing with feet narrow   1/2 tandem 2x30" Mod-severe sway  Staggered stance balance 2x30" Mod-severe sway    HOME EXERCISE PROGRAM Last updated: 03/19/22 Access Code: [email or web address]: [email or web address] Date: 03/20/2022 Prepared by: MCEden ClinicProgram Notes perform exercises in a corner for safety!  Exercises - Seated Gaze Stabilization with Head Rotation  - 1 x daily - 7 x weekly - 3-5 sets - 30 sec hold - Standing Near Stance in Corner  - 1 x daily - 7 x weekly - 1-3 sets - 30 sec hold - Corner Balance Feet Together With Eyes Closed  - 1 x daily - 7 x weekly - 1-3 sets - -30 sec hold - Corner Balance Feet Apart: Eyes Open With Head Turns  - 1 x daily - 7 x weekly - 5-10 reps - Standing Romberg to 1/2 Tandem Stance  - 1 x daily - 5 x weekly - 2 sets - 30 sec hold - Semi-Tandem Corner Balance With Eyes Open  - 1 x daily - 5 x weekly - 2 sets - 30 sec hold   PATIENT EDUCATION: Education details: HEP update- to be performed in corner for safety; advised to use 4WW for ambulation Person educated: Patient Education method: Explanation, Demonstration, Tactile cues, Verbal cues, and Handouts Education comprehension: verbalized understanding and returned demonstration    Below measures were taken at time of initial evaluation unless otherwise specified:  DIAGNOSTIC FINDINGS:  Per CT 12/2021:  Disc levels:  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 No acute intracranial abnormality. 2. No evidence of acute cervical spine fracture or traumatic malalignment.  COGNITION: Overall cognitive status: Within functional limits for tasks assessed   Cervical ROM:   WFL  Active A/PROM (deg) eval  Flexion   Extension   Right lateral flexion   Left lateral flexion   Right rotation   Left rotation   (Blank rows = not tested)  STRENGTH: Decreased functional strength, per 5x sit<>stand  TRANSFERS: Assistive device utilized: None  Sit to stand: Modified independence Stand to sit: Modified independence  GAIT: Gait pattern: step through pattern and wide BOS Distance walked: 60 ft Assistive device utilized: None Level of assistance: SBA Comments: With turn around furniture, mild LOB towards R side, able to self-recover  FUNCTIONAL TESTs:  5 times sit to stand: 22.22 Dynamic Gait Index: 11/24  M-CTSIB  Condition 1: Firm Surface, EO 30 Sec, Normal Sway  Condition 2: Firm Surface, EC 30 Sec, Mild Sway  Condition 3: Foam Surface, EO 30 Sec,  Moderate Sway  Condition 4: Foam Surface, EC 9.31 Sec, Severe Sway    PATIENT SURVEYS:  FOTO Intake score = 50; predicted score = 58   VESTIBULAR ASSESSMENT   GENERAL OBSERVATION: Unsteadiness, cautious with gait    SYMPTOM BEHAVIOR:   Subjective history: Reports that her balance has been off for several years.  Pretty immediate symptoms of imbalance, need to grab for support   Non-Vestibular symptoms:  falls   Type of dizziness: Diplopia, Imbalance (Disequilibrium), Spinning/Vertigo, and Unsteady with head/body turns   Frequency: several times per week    Duration: approx 5 minutes   Aggravating factors: Induced by motion: occur when walking, bending down to the ground, turning body quickly, and turning head quickly   Relieving factors:  sit down   Progression of symptoms: worse   OCULOMOTOR EXAM:   Ocular Alignment: normal   Ocular ROM:  slightly limited R and L; moves head with eye motion initially   Spontaneous Nystagmus: absent   Gaze-Induced Nystagmus: absent   Smooth Pursuits: intact   Saccades: intact   Convergence/Divergence: decreased convergence noted     VESTIBULAR - OCULAR  REFLEX:    Slow VOR: Normal   VOR Cancellation: Comment: limited head turns to keep eyes focused on target   Head-Impulse Test: HIT Right: negative HIT Left: positive, no c/o   Dynamic Visual Acuity:  Attempted, but not able to be assessed, due to pt not having her glasses on      MOTION SENSITIVITY:    Motion Sensitivity Quotient  Intensity: 0 = none, 1 = Lightheaded, 2 = Mild, 3 = Moderate, 4 = Severe, 5 = Vomiting  Intensity  1. Sitting to supine   2. Supine to L side   3. Supine to R side   4. Supine to sitting   5. L Hallpike-Dix   6. Up from L    7. R Hallpike-Dix   8. Up from R    9. Sitting, head  tipped to L knee   10. Head up from L  knee   11. Sitting, head  tipped to R knee   12. Head up from R  knee   13. Sitting head turns x5   14.Sitting head nods x5   15. In stance, 180  turn to L    16. In stance, 180  turn to R      PATIENT EDUCATION: Education details: Eval results, POC Person educated: Patient Education method: Explanation Education comprehension: verbalized understanding   GOALS: Goals reviewed with patient? Yes  SHORT TERM GOALS: = LTGs   LONG TERM GOALS: Target date: 04/10/2022  Pt will be independent with HEP for improved balance, transfers, gait and overall c/o decreased dizziness Baseline:  Goal status: IN PROGRESS  2.  Pt will improve 5x sit<>stand to less than or equal to 15 sec to demonstrate improved functional strength and transfer efficiency. Baseline: 22.22 sec Goal status: IN PROGRESS  3.  Pt will Condition 4 on MCTSIB, for 30 seconds with mild-moderate sway, to demonstrate improved vestibular system use for balance. Baseline: 9.31 sec prior to opening eyes Goal status: IN PROGRESS  4. Pt will improve DGI score by at least 4 points to decrease fall risk. Baseline: 11/24 03/12/22 Goal status: IN PROGRESS  5.  Pt will improve FOTO score to 58 for improved overall functional mobility and decreased  unsteadiness. Baseline:  Goal status: IN PROGRESS  ASSESSMENT:  CLINICAL IMPRESSION: Patient arrived to session with [facility], noting that it  doesn't feel quite as comfortable as the cane trialed in session. Worked on gait training with multiple devices with heavy cueing to reduce speed and improve control. Patient still with episodes of imbalance with AD's used today, thus recommended 4WW as main ambulatory device for now. Increased sway evident with narrow BOS compared to other balance challenges like EC and compliant surface.  Thus, updated HEP with these activities to be performed in corner for safety. Patient reported understanding of all edu provided and without complaints at end of session.     OBJECTIVE IMPAIRMENTS Abnormal gait, decreased balance, decreased knowledge of use of DME, decreased mobility, difficulty walking, decreased strength, and dizziness.   ACTIVITY LIMITATIONS transfers and locomotion level  PARTICIPATION LIMITATIONS: shopping and community activity  PERSONAL FACTORS 3+ comorbidities: See above  are also affecting patient's functional outcome.   REHAB POTENTIAL: Good  CLINICAL DECISION MAKING: Evolving/moderate complexity  EVALUATION COMPLEXITY: Moderate   PLAN: PT FREQUENCY: 2x/week  PT DURATION: other: 5 weeks, including eval week  PLANNED INTERVENTIONS: Therapeutic exercises, Therapeutic activity, Neuromuscular re-education, Balance training, Gait training, Patient/Family education, Self Care, Joint mobilization, Vestibular training, Canalith repositioning, and DME instructions  PLAN FOR NEXT SESSION: gait training with AD- B walking poles vs. 4WW? Assess BPPV if needed; work on corner balance exercises and on compliant surfaces; initiate HEP   Janene Harvey, PT, DPT 03/20/22 10:37 AM  [facility] [place], [place],  [address] Phone # [phone] Fax # [phone]

## 2022-03-20 ENCOUNTER — Ambulatory Visit: Payer: Medicare Other | Admitting: Physical Therapy

## 2022-03-20 DIAGNOSIS — R2689 Other abnormalities of gait and mobility: Secondary | ICD-10-CM | POA: Diagnosis not present

## 2022-03-20 DIAGNOSIS — R42 Dizziness and giddiness: Secondary | ICD-10-CM | POA: Diagnosis not present

## 2022-03-20 DIAGNOSIS — R2681 Unsteadiness on feet: Secondary | ICD-10-CM

## 2022-03-23 DIAGNOSIS — M810 Age-related osteoporosis without current pathological fracture: Secondary | ICD-10-CM | POA: Diagnosis not present

## 2022-03-23 DIAGNOSIS — R2689 Other abnormalities of gait and mobility: Secondary | ICD-10-CM | POA: Diagnosis not present

## 2022-03-23 DIAGNOSIS — Z7952 Long term (current) use of systemic steroids: Secondary | ICD-10-CM | POA: Diagnosis not present

## 2022-03-23 DIAGNOSIS — Z23 Encounter for immunization: Secondary | ICD-10-CM | POA: Diagnosis not present

## 2022-03-23 DIAGNOSIS — M05711 Rheumatoid arthritis with rheumatoid factor of right shoulder without organ or systems involvement: Secondary | ICD-10-CM | POA: Diagnosis not present

## 2022-03-23 DIAGNOSIS — Z7960 Long term (current) use of unspecified immunomodulators and immunosuppressants: Secondary | ICD-10-CM | POA: Diagnosis not present

## 2022-03-23 DIAGNOSIS — R600 Localized edema: Secondary | ICD-10-CM | POA: Diagnosis not present

## 2022-03-23 DIAGNOSIS — J45909 Unspecified asthma, uncomplicated: Secondary | ICD-10-CM | POA: Diagnosis not present

## 2022-03-23 DIAGNOSIS — Z7951 Long term (current) use of inhaled steroids: Secondary | ICD-10-CM | POA: Diagnosis not present

## 2022-03-23 DIAGNOSIS — J849 Interstitial pulmonary disease, unspecified: Secondary | ICD-10-CM | POA: Diagnosis not present

## 2022-03-23 DIAGNOSIS — Z79899 Other long term (current) drug therapy: Secondary | ICD-10-CM | POA: Diagnosis not present

## 2022-03-24 ENCOUNTER — Ambulatory Visit: Payer: Medicare Other | Admitting: Physical Therapy

## 2022-03-24 NOTE — Therapy (Signed)
OUTPATIENT PHYSICAL THERAPY VESTIBULAR/NEURO TREATMENT     Patient Name: Stacey [name] MRN: [id number] DOB:1940-08-11, 81 y.o., female Today's Date: 03/25/2022  PCP: Shon Baton, MD REFERRING PROVIDER: Raylene Miyamoto, MD   PT End of Session - 03/25/22 0929     Visit Number 5    Number of Visits 10    Date for PT Re-Evaluation 04/10/22    Authorization Type Medicare/BCBS    PT Start Time 0851   pt late   PT Stop Time 0930    PT Time Calculation (min) 39 min    Equipment Utilized During Treatment Gait belt    Activity Tolerance Patient tolerated treatment well    Behavior During Therapy WFL for tasks assessed/performed                Past Medical History:  Diagnosis Date   Abnormal finding of blood chemistry    Asthma    H/O measles    H/O varicella    Hypertension    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[place])    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   Lichen sclerosus 78/24/23   Asymptomatic   Low iron    Mitral valve prolapse    Osteoarthritis    Osteoporosis    Pneumonia    Post herpetic neuralgia    Rheumatoid arthritis(714.0)    Yeast infection    Past Surgical History:  Procedure Laterality Date   CHOLECYSTECTOMY  2011   IR ANGIO INTRA EXTRACRAN SEL COM CAROTID INNOMINATE BILAT MOD SED  06/18/2020   IR ANGIO VERTEBRAL SEL SUBCLAVIAN INNOMINATE UNI R MOD SED  06/18/2020   IR ANGIO VERTEBRAL SEL VERTEBRAL UNI L MOD SED  06/18/2020   WISDOM TOOTH EXTRACTION     Patient Active Problem List   Diagnosis Date Noted   Chronic cough 06/12/2020   Constipation    CAP (community acquired pneumonia) 02/09/2020   Acute on chronic respiratory failure with hypoxia ([place]) 07/06/2019   Fall at home, initial encounter 07/06/2019   Orthostatic hypotension 07/06/2019   Rib fractures 07/04/2019   Pneumonia due to COVID-19 virus 06/11/2019   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[place]) 05/07/2019   Acute asthma exacerbation 12/18/2018   Elevated troponin 12/18/2018   Acute bronchitis 08/30/2018    Chronic respiratory failure with hypoxia ([place]) 04/06/2018   Essential hypertension 03/24/2018   Depression 03/24/2018   Asthma exacerbation 03/23/2018   DOE (dyspnea on exertion) 03/21/2018   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 esophagitis 03/21/2018   Chronic lung disease    Hypoxia    Dyspnea 01/23/2018   ILD (interstitial lung disease) ([place]) 01/23/2018   Claw toe, acquired, 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   Restrictive lung disease 07/18/2013   Multiple pulmonary nodules/RA lung dz  05/29/2013   Cough variant asthma vs uacs  05/29/2013   Rheumatoid arthritis ([place]) 01/13/2012   Osteopenia 53/61/4431   Lichen sclerosus et atrophicus of the vulva 01/13/2012   Asthma 01/13/2012    ONSET DATE: 03/04/2022 (MD referral)  REFERRING DIAG: R42 (ICD-10-CM) - Dizziness  THERAPY DIAG:  Unsteadiness on feet  Other abnormalities of gait and mobility  Dizziness and giddiness  Rationale for Evaluation and Treatment Rehabilitation  SUBJECTIVE:   SUBJECTIVE STATEMENT: No new concerns.  Pt accompanied by: self  PERTINENT HISTORY: See above for PMH   PAIN:  Are you having pain? No  PRECAUTIONS: None  FALLS: Has patient fallen in last 6 months? Yes. Number of falls 2  LIVING ENVIRONMENT: Lives with: lives with their spouse Lives in: House/apartment Stairs: Yes: Internal: 2nd leve,  but bedroom on first floor steps; has elevator and External: 2 steps; none Has following equipment at home: Single point cane, but does not use  PLOF: Independent  PATIENT GOALS To feel better and get my balance back  OBJECTIVE:    TODAY'S TREATMENT: 03/25/22 Activity Comments  Gait training with 4WW [id number] Much improved stability; cueing to stay within perimeter of walker d/t 1 episode of tripping over wheel  romberg EC 30" Mild-mod sway; cues to tighten core and hip musculature  1/2 tandem 30" each foot Mild-mod sway; cues to tighten core and hip musculature; more difficulty with R foot back  staggered stance balance  Mild-mod sway; cues to tighten core and hip musculature; more difficulty with R foot back  R ankle inversion AROM  0 deg  R ankle inversion/eversion AROM 10x   R ankle inversion self-stretch with pillow case 20" Tolerated well     LOWER EXTREMITY MMT:    MMT (in sitting) Right eval Left eval  Hip flexion 4 4-  Hip extension    Hip abduction 3+ 3+  Hip adduction 4- 4-  Hip internal rotation    Hip external rotation    Knee flexion 4 4-  Knee extension 4 4  Ankle dorsiflexion 4- 4  Ankle plantarflexion 2+ (0 reps) 14 reps in standing  Ankle inversion 2 4  Ankle eversion 4+ 4+   (Blank rows = not tested)     HOME EXERCISE PROGRAM Last updated: 03/25/22 Access Code: [email or web address]: [email or web address] Date: 03/25/2022 Prepared by: [facility] Program Notes perform exercises in a corner for safety!  Exercises - Seated Gaze Stabilization with Head Rotation  - 1 x daily - 7 x weekly - 3-5 sets - 30 sec hold - Standing Near Stance in Corner  - 1 x daily - 7 x weekly - 1-3 sets - 30 sec hold - Corner Balance Feet Together With Eyes Closed  - 1 x daily - 7 x weekly - 1-3 sets - -30 sec hold - Corner Balance Feet Apart: Eyes Open With Head Turns  - 1 x daily - 7 x weekly - 5-10 reps - Standing Romberg to 1/2 Tandem Stance  - 1 x daily - 5 x weekly - 2 sets - 30 sec hold - Semi-Tandem Corner Balance With Eyes Open  - 1 x daily - 5 x weekly - 2 sets - 30 sec hold - Seated Hip Abduction  - 1 x daily - 5 x weekly - 2 sets - 10 reps - Heel Raises with Counter Support  - 1 x daily - 5 x weekly - 2 sets - 10 reps - Seated Hip Adduction Isometrics with Ball  - 1 x daily - 5 x weekly - 2 sets - 10 reps - 3 sec hold - Seated Ankle Inversion Eversion PROM  - 1 x daily - 5 x weekly - 2 sets - 30 sec hold - Ankle Inversion Eversion Towel Slide  - 1 x daily - 5 x weekly - 2 sets - 10 reps   PATIENT EDUCATION: Education details: edu on  objective findings and HEPupdate Person educated: Patient Education method: Explanation, Demonstration, Tactile cues, Verbal cues, and Handouts Education comprehension: verbalized understanding and returned demonstration     Below measures were taken at time of initial evaluation unless otherwise specified:  DIAGNOSTIC FINDINGS:  Per CT 12/2021:  Disc levels:  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 No acute intracranial abnormality. 2. No evidence of acute cervical spine fracture or traumatic malalignment.  COGNITION: Overall cognitive status: Within functional limits for tasks assessed   Cervical ROM:  WFL  Active A/PROM (deg) eval  Flexion   Extension   Right lateral flexion   Left lateral flexion   Right rotation   Left rotation   (Blank rows = not tested)  STRENGTH: Decreased functional strength, per 5x sit<>stand  TRANSFERS: Assistive device utilized: None  Sit to stand: Modified independence Stand to sit: Modified independence  GAIT: Gait pattern: step through pattern and wide BOS Distance walked: 60 ft Assistive device utilized: None Level of assistance: SBA Comments: With turn around furniture, mild LOB towards R side, able to self-recover  FUNCTIONAL TESTs:  5 times sit to stand: 22.22 Dynamic Gait Index: 11/24  M-CTSIB  Condition 1: Firm Surface, EO 30 Sec, Normal Sway  Condition 2: Firm Surface, EC 30 Sec, Mild Sway  Condition 3: Foam Surface, EO 30 Sec, Moderate Sway  Condition 4: Foam Surface, EC 9.31 Sec, Severe Sway    PATIENT SURVEYS:  FOTO Intake score = 50; predicted score = 58   VESTIBULAR ASSESSMENT   GENERAL OBSERVATION: Unsteadiness, cautious with gait    SYMPTOM BEHAVIOR:   Subjective history: Reports that her balance has been off for several years.  Pretty immediate symptoms of imbalance, need to grab for support   Non-Vestibular symptoms:  falls   Type of dizziness: Diplopia, Imbalance  (Disequilibrium), Spinning/Vertigo, and Unsteady with head/body turns   Frequency: several times per week    Duration: approx 5 minutes   Aggravating factors: Induced by motion: occur when walking, bending down to the ground, turning body quickly, and turning head quickly   Relieving factors:  sit down   Progression of symptoms: worse   OCULOMOTOR EXAM:   Ocular Alignment: normal   Ocular ROM:  slightly limited R and L; moves head with eye motion initially   Spontaneous Nystagmus: absent   Gaze-Induced Nystagmus: absent   Smooth Pursuits: intact   Saccades: intact   Convergence/Divergence: decreased convergence noted     VESTIBULAR - OCULAR REFLEX:    Slow VOR: Normal   VOR Cancellation: Comment: limited head turns to keep eyes focused on target   Head-Impulse Test: HIT Right: negative HIT Left: positive, no c/o   Dynamic Visual Acuity:  Attempted, but not able to be assessed, due to pt not having her glasses on      MOTION SENSITIVITY:    Motion Sensitivity Quotient  Intensity: 0 = none, 1 = Lightheaded, 2 = Mild, 3 = Moderate, 4 = Severe, 5 = Vomiting  Intensity  1. Sitting to supine   2. Supine to L side   3. Supine to R side   4. Supine to sitting   5. L Hallpike-Dix   6. Up from L    7. R Hallpike-Dix   8. Up from R    9. Sitting, head  tipped to L knee   10. Head up from L  knee   11. Sitting, head  tipped to R knee   12. Head up from R  knee   13. Sitting head turns x5   14.Sitting head nods x5   15. In stance, 180  turn to L    16. In stance, 180  turn to R      PATIENT EDUCATION: Education details: Eval results, POC Person educated: Patient Education method: Explanation Education comprehension: verbalized understanding  GOALS: Goals reviewed with patient? Yes  SHORT TERM GOALS: = LTGs   LONG TERM GOALS: Target date: 04/10/2022  Pt will be independent with HEP for improved balance, transfers, gait and overall c/o decreased  dizziness Baseline:  Goal status: IN PROGRESS  2.  Pt will improve 5x sit<>stand to less than or equal to 15 sec to demonstrate improved functional strength and transfer efficiency. Baseline: 22.22 sec Goal status: IN PROGRESS  3.  Pt will Condition 4 on MCTSIB, for 30 seconds with mild-moderate sway, to demonstrate improved vestibular system use for balance. Baseline: 9.31 sec prior to opening eyes Goal status: IN PROGRESS  4. Pt will improve DGI score by at least 4 points to decrease fall risk. Baseline: 11/24 03/12/22 Goal status: IN PROGRESS  5.  Pt will improve FOTO score to 58 for improved overall functional mobility and decreased unsteadiness. Baseline:  Goal status: IN PROGRESS  ASSESSMENT:  CLINICAL IMPRESSION: Patient arrived to session ambulating with 4WW with good stability. Reviewed HEP update with patient demonstrating slightly more stability with static standing activities. Tested LE strength which revealed marked weakness in B hips and R ankle. R ankle inversion ROM significantly limited, thus initiated hip and ankle stretching/strengthening to address these deficits. Encouraged personal training at [facility] as patient is already a member and reports apprehension about returning to workouts on her own. Patient reported understanding of all edu provided and without complaints at end of session.      OBJECTIVE IMPAIRMENTS Abnormal gait, decreased balance, decreased knowledge of use of DME, decreased mobility, difficulty walking, decreased strength, and dizziness.   ACTIVITY LIMITATIONS transfers and locomotion level  PARTICIPATION LIMITATIONS: shopping and community activity  PERSONAL FACTORS 3+ comorbidities: See above  are also affecting patient's functional outcome.   REHAB POTENTIAL: Good  CLINICAL DECISION MAKING: Evolving/moderate complexity  EVALUATION COMPLEXITY: Moderate   PLAN: PT FREQUENCY: 2x/week  PT DURATION: other: 5 weeks, including eval  week  PLANNED INTERVENTIONS: Therapeutic exercises, Therapeutic activity, Neuromuscular re-education, Balance training, Gait training, Patient/Family education, Self Care, Joint mobilization, Vestibular training, Canalith repositioning, and DME instructions  PLAN FOR NEXT SESSION: R ankle and B hip strengthening; gait training with AD- B walking poles vs. 4WW? work on corner balance exercises and on compliant surfaces   Janene Harvey, PT, DPT 03/25/22 9:30 AM  [facility] at [facility] [address], [place], [place] [address] Phone # [phone] Fax # [phone]

## 2022-03-25 ENCOUNTER — Ambulatory Visit: Payer: Medicare Other | Admitting: Physical Therapy

## 2022-03-25 ENCOUNTER — Encounter: Payer: Self-pay | Admitting: Physical Therapy

## 2022-03-25 DIAGNOSIS — R2689 Other abnormalities of gait and mobility: Secondary | ICD-10-CM

## 2022-03-25 DIAGNOSIS — R42 Dizziness and giddiness: Secondary | ICD-10-CM

## 2022-03-25 DIAGNOSIS — R2681 Unsteadiness on feet: Secondary | ICD-10-CM

## 2022-03-27 ENCOUNTER — Ambulatory Visit: Payer: Medicare Other | Admitting: Physical Therapy

## 2022-03-27 DIAGNOSIS — R2689 Other abnormalities of gait and mobility: Secondary | ICD-10-CM | POA: Diagnosis not present

## 2022-03-27 DIAGNOSIS — R42 Dizziness and giddiness: Secondary | ICD-10-CM

## 2022-03-27 DIAGNOSIS — R2681 Unsteadiness on feet: Secondary | ICD-10-CM

## 2022-03-27 NOTE — Therapy (Signed)
OUTPATIENT PHYSICAL THERAPY VESTIBULAR/NEURO TREATMENT     Patient Name: Stacey [name] MRN: [id number] DOB:19-Jan-1941, 81 y.o., female Today's Date: 03/27/2022  PCP: Shon Baton, MD REFERRING PROVIDER: Raylene Miyamoto, MD   PT End of Session - 03/27/22 0937     Visit Number 6    Number of Visits 10    Date for PT Re-Evaluation 04/10/22    Authorization Type Medicare/BCBS    PT Start Time 5732    PT Stop Time 1015    PT Time Calculation (min) 38 min    Equipment Utilized During Treatment Gait belt    Activity Tolerance Patient tolerated treatment well    Behavior During Therapy WFL for tasks assessed/performed                 Past Medical History:  Diagnosis Date   Abnormal finding of blood chemistry    Asthma    H/O measles    H/O varicella    Hypertension    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[place])    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   Lichen sclerosus 70/62/37   Asymptomatic   Low iron    Mitral valve prolapse    Osteoarthritis    Osteoporosis    Pneumonia    Post herpetic neuralgia    Rheumatoid arthritis(714.0)    Yeast infection    Past Surgical History:  Procedure Laterality Date   CHOLECYSTECTOMY  2011   IR ANGIO INTRA EXTRACRAN SEL COM CAROTID INNOMINATE BILAT MOD SED  06/18/2020   IR ANGIO VERTEBRAL SEL SUBCLAVIAN INNOMINATE UNI R MOD SED  06/18/2020   IR ANGIO VERTEBRAL SEL VERTEBRAL UNI L MOD SED  06/18/2020   WISDOM TOOTH EXTRACTION     Patient Active Problem List   Diagnosis Date Noted   Chronic cough 06/12/2020   Constipation    CAP (community acquired pneumonia) 02/09/2020   Acute on chronic respiratory failure with hypoxia ([place]) 07/06/2019   Fall at home, initial encounter 07/06/2019   Orthostatic hypotension 07/06/2019   Rib fractures 07/04/2019   Pneumonia due to COVID-19 virus 06/11/2019   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[place]) 05/07/2019   Acute asthma exacerbation 12/18/2018   Elevated troponin 12/18/2018   Acute bronchitis 08/30/2018    Chronic respiratory failure with hypoxia ([place]) 04/06/2018   Essential hypertension 03/24/2018   Depression 03/24/2018   Asthma exacerbation 03/23/2018   DOE (dyspnea on exertion) 03/21/2018   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 esophagitis 03/21/2018   Chronic lung disease    Hypoxia    Dyspnea 01/23/2018   ILD (interstitial lung disease) ([place]) 01/23/2018   Claw toe, acquired, 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   Restrictive lung disease 07/18/2013   Multiple pulmonary nodules/RA lung dz  05/29/2013   Cough variant asthma vs uacs  05/29/2013   Rheumatoid arthritis ([place]) 01/13/2012   Osteopenia 62/83/1517   Lichen sclerosus et atrophicus of the vulva 01/13/2012   Asthma 01/13/2012    ONSET DATE: 03/04/2022 (MD referral)  REFERRING DIAG: R42 (ICD-10-CM) - Dizziness  THERAPY DIAG:  Unsteadiness on feet  Other abnormalities of gait and mobility  Dizziness and giddiness  Rationale for Evaluation and Treatment Rehabilitation  SUBJECTIVE:   SUBJECTIVE STATEMENT: Nothing new.  Didn't have a chance to do my homework. Pt accompanied by: self  PERTINENT HISTORY: See above for PMH   PAIN:  Are you having pain? No  PRECAUTIONS: None  FALLS: Has patient fallen in last 6 months? Yes. Number of falls 2  LIVING ENVIRONMENT: Lives with: lives with their spouse Lives in: House/apartment  Stairs: Yes: Internal: 2nd leve, but bedroom on first floor steps; has elevator and External: 2 steps; none Has following equipment at home: Single point cane, but does not use  PLOF: Independent  PATIENT GOALS To feel better and get my balance back  OBJECTIVE:    TODAY'S TREATMENT: 03/27/2022 Activity Comments  Review of HEP additions: Seated hip abduction red band Seated ankle inversion/eversion stretch (modified position with foot propped on step) Seated ankle eversion/inversion with pillowcase  Standing heel raises  Min cues for technique with seated exercises  Stagger stance forward/back rocking for active ankle motion,  2 x 10 reps Initial cues for technique; pt does report having hx of hammertoe surgery with pins on R foot and toes "don't move"  Wall bumps for hip strategy work, 10 reps    Standing on [employer]: Feet apart EO/EC 30 seconds, 2 reps Wide BOS lateral weightshift 10 reps Side step taps Forward step taps-cues for wider BOS upon return to midline Cues to look ahead at target and pt reports double vision  Gait on outdoor surfaces with bilateral walking poles 60 ft x 2 Difficulty sequencing walking poles with mild imbalance with trying to get back into sequence.        PATIENT EDUCATION: Education details: Discussed benefits to using rollator versus bilateral walking poles on outdoor, unlevel surfaces. Person educated: Patient Education method: Explanation Education comprehension: verbalized understanding     LOWER EXTREMITY MMT:    MMT (in sitting) Right eval Left eval  Hip flexion 4 4-  Hip extension    Hip abduction 3+ 3+  Hip adduction 4- 4-  Hip internal rotation    Hip external rotation    Knee flexion 4 4-  Knee extension 4 4  Ankle dorsiflexion 4- 4  Ankle plantarflexion 2+ (0 reps) 14 reps in standing  Ankle inversion 2 4  Ankle eversion 4+ 4+   (Blank rows = not tested)     HOME EXERCISE PROGRAM Last updated: 03/25/22 Access Code: [email or web address] URL: [email or web address] Date: 03/25/2022 Prepared by: [facility]  Program Notes perform exercises in a corner for safety!  Exercises - Seated Gaze Stabilization with Head Rotation  - 1 x daily - 7 x weekly - 3-5 sets - 30 sec hold - Standing Near Stance in Corner  - 1 x daily - 7 x weekly - 1-3 sets - 30 sec hold - Corner Balance Feet Together With Eyes Closed  - 1 x daily - 7 x weekly - 1-3 sets - -30 sec hold - Corner Balance Feet Apart: Eyes Open With Head Turns  - 1 x daily - 7 x weekly - 5-10 reps - Standing Romberg to 1/2 Tandem Stance  - 1 x daily - 5 x weekly - 2  sets - 30 sec hold - Semi-Tandem Corner Balance With Eyes Open  - 1 x daily - 5 x weekly - 2 sets - 30 sec hold - Seated Hip Abduction  - 1 x daily - 5 x weekly - 2 sets - 10 reps - Heel Raises with Counter Support  - 1 x daily - 5 x weekly - 2 sets - 10 reps - Seated Hip Adduction Isometrics with Ball  - 1 x daily - 5 x weekly - 2 sets - 10 reps - 3 sec hold - Seated Ankle Inversion Eversion PROM  - 1 x daily - 5 x weekly - 2 sets - 30 sec hold - Ankle Inversion  Eversion Towel Slide  - 1 x daily - 5 x weekly - 2 sets - 10 reps      Below measures were taken at time of initial evaluation unless otherwise specified:  DIAGNOSTIC FINDINGS:  Per CT 12/2021:  Disc levels:  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 No acute intracranial abnormality. 2. No evidence of acute cervical spine fracture or traumatic malalignment.  COGNITION: Overall cognitive status: Within functional limits for tasks assessed   Cervical ROM:  WFL  Active A/PROM (deg) eval  Flexion   Extension   Right lateral flexion   Left lateral flexion   Right rotation   Left rotation   (Blank rows = not tested)  STRENGTH: Decreased functional strength, per 5x sit<>stand  TRANSFERS: Assistive device utilized: None  Sit to stand: Modified independence Stand to sit: Modified independence  GAIT: Gait pattern: step through pattern and wide BOS Distance walked: 60 ft Assistive device utilized: None Level of assistance: SBA Comments: With turn around furniture, mild LOB towards R side, able to self-recover  FUNCTIONAL TESTs:  5 times sit to stand: 22.22 Dynamic Gait Index: 11/24  M-CTSIB  Condition 1: Firm Surface, EO 30 Sec, Normal Sway  Condition 2: Firm Surface, EC 30 Sec, Mild Sway  Condition 3: Foam Surface, EO 30 Sec, Moderate Sway  Condition 4: Foam Surface, EC 9.31 Sec, Severe Sway    PATIENT SURVEYS:  FOTO Intake score = 50; predicted score = 58   VESTIBULAR  ASSESSMENT   GENERAL OBSERVATION: Unsteadiness, cautious with gait    SYMPTOM BEHAVIOR:   Subjective history: Reports that her balance has been off for several years.  Pretty immediate symptoms of imbalance, need to grab for support   Non-Vestibular symptoms:  falls   Type of dizziness: Diplopia, Imbalance (Disequilibrium), Spinning/Vertigo, and Unsteady with head/body turns   Frequency: several times per week    Duration: approx 5 minutes   Aggravating factors: Induced by motion: occur when walking, bending down to the ground, turning body quickly, and turning head quickly   Relieving factors:  sit down   Progression of symptoms: worse   OCULOMOTOR EXAM:   Ocular Alignment: normal   Ocular ROM:  slightly limited R and L; moves head with eye motion initially   Spontaneous Nystagmus: absent   Gaze-Induced Nystagmus: absent   Smooth Pursuits: intact   Saccades: intact   Convergence/Divergence: decreased convergence noted     VESTIBULAR - OCULAR REFLEX:    Slow VOR: Normal   VOR Cancellation: Comment: limited head turns to keep eyes focused on target   Head-Impulse Test: HIT Right: negative HIT Left: positive, no c/o   Dynamic Visual Acuity:  Attempted, but not able to be assessed, due to pt not having her glasses on      MOTION SENSITIVITY:    Motion Sensitivity Quotient  Intensity: 0 = none, 1 = Lightheaded, 2 = Mild, 3 = Moderate, 4 = Severe, 5 = Vomiting  Intensity  1. Sitting to supine   2. Supine to L side   3. Supine to R side   4. Supine to sitting   5. L Hallpike-Dix   6. Up from L    7. R Hallpike-Dix   8. Up from R    9. Sitting, head  tipped to L knee   10. Head up from L  knee   11. Sitting, head  tipped to R knee   12. Head up from R  knee   13. Sitting head turns x5   14.Sitting head nods x5   15. In stance, 180  turn to L    16. In stance, 180  turn to R      PATIENT EDUCATION: Education details: Eval results, POC Person educated:  Patient Education method: Explanation Education comprehension: verbalized understanding   GOALS: Goals reviewed with patient? Yes  SHORT TERM GOALS: = LTGs   LONG TERM GOALS: Target date: 04/10/2022  Pt will be independent with HEP for improved balance, transfers, gait and overall c/o decreased dizziness Baseline:  Goal status: IN PROGRESS  2.  Pt will improve 5x sit<>stand to less than or equal to 15 sec to demonstrate improved functional strength and transfer efficiency. Baseline: 22.22 sec Goal status: IN PROGRESS  3.  Pt will Condition 4 on MCTSIB, for 30 seconds with mild-moderate sway, to demonstrate improved vestibular system use for balance. Baseline: 9.31 sec prior to opening eyes Goal status: IN PROGRESS  4. Pt will improve DGI score by at least 4 points to decrease fall risk. Baseline: 11/24 03/12/22 Goal status: IN PROGRESS  5.  Pt will improve FOTO score to 58 for improved overall functional mobility and decreased unsteadiness. Baseline:  Goal status: IN PROGRESS  ASSESSMENT:  CLINICAL IMPRESSION: Pt comes to therapy today without assistive device.  Trialed bilateral walking poles outdoors, but pt has difficulty maintaining reciprocal sequence; when she tries to get back into sequence, she experiences mild LOB.  Based on note from last session with [phone] (rollator), PT discusses that rollator walker is likely safest option for patient on outdoor and unlevel surfaces at this time.  With compliant surface exercises in session, pt requires UE support for stability.  She will continue to benefit from skilled PT towards goals for improved functional mobility and decreased fall risk.    OBJECTIVE IMPAIRMENTS Abnormal gait, decreased balance, decreased knowledge of use of DME, decreased mobility, difficulty walking, decreased strength, and dizziness.   ACTIVITY LIMITATIONS transfers and locomotion level  PARTICIPATION LIMITATIONS: shopping and community  activity  PERSONAL FACTORS 3+ comorbidities: See above  are also affecting patient's functional outcome.   REHAB POTENTIAL: Good  CLINICAL DECISION MAKING: Evolving/moderate complexity  EVALUATION COMPLEXITY: Moderate   PLAN: PT FREQUENCY: 2x/week  PT DURATION: other: 5 weeks, including eval week  PLANNED INTERVENTIONS: Therapeutic exercises, Therapeutic activity, Neuromuscular re-education, Balance training, Gait training, Patient/Family education, Self Care, Joint mobilization, Vestibular training, Canalith repositioning, and DME instructions  PLAN FOR NEXT SESSION: Continue R ankle and B hip strengthening; gait training with AD- 4OE? (Rollator) outdoors; work on Therapist, sports exercises and on compliant surfaces.  Check about prism glasses in regards to double vision.   Mady Haagensen, PT 03/27/22 9:39 AM Phone: [phone] Fax: [phone]   [facility] [place], [place], [place] [address] Phone # [phone] Fax # [phone]

## 2022-03-31 ENCOUNTER — Ambulatory Visit: Payer: Medicare Other | Admitting: Physical Therapy

## 2022-03-31 ENCOUNTER — Encounter: Payer: Self-pay | Admitting: Physical Therapy

## 2022-03-31 DIAGNOSIS — R42 Dizziness and giddiness: Secondary | ICD-10-CM

## 2022-03-31 DIAGNOSIS — R2689 Other abnormalities of gait and mobility: Secondary | ICD-10-CM

## 2022-03-31 DIAGNOSIS — R2681 Unsteadiness on feet: Secondary | ICD-10-CM | POA: Diagnosis not present

## 2022-03-31 NOTE — Therapy (Signed)
OUTPATIENT PHYSICAL THERAPY VESTIBULAR/NEURO TREATMENT     Patient Name: Stacey [name] MRN: [id number] DOB:10-10-1940, 81 y.o., female Today's Date: 03/31/2022  PCP: Shon Baton, MD REFERRING PROVIDER: Raylene Miyamoto, MD   PT End of Session - 03/31/22 1231     Visit Number 7    Number of Visits 10    Date for PT Re-Evaluation 04/10/22    Authorization Type Medicare/BCBS    PT Start Time 1233    Equipment Utilized During Treatment Gait belt    Activity Tolerance Patient tolerated treatment well    Behavior During Therapy [facility] for tasks assessed/performed                 Past Medical History:  Diagnosis Date   Abnormal finding of blood chemistry    Asthma    H/O measles    H/O varicella    Hypertension    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[place])    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   Lichen sclerosus 51/76/16   Asymptomatic   Low iron    Mitral valve prolapse    Osteoarthritis    Osteoporosis    Pneumonia    Post herpetic neuralgia    Rheumatoid arthritis(714.0)    Yeast infection    Past Surgical History:  Procedure Laterality Date   CHOLECYSTECTOMY  2011   IR ANGIO INTRA EXTRACRAN SEL COM CAROTID INNOMINATE BILAT MOD SED  06/18/2020   IR ANGIO VERTEBRAL SEL SUBCLAVIAN INNOMINATE UNI R MOD SED  06/18/2020   IR ANGIO VERTEBRAL SEL VERTEBRAL UNI L MOD SED  06/18/2020   WISDOM TOOTH EXTRACTION     Patient Active Problem List   Diagnosis Date Noted   Chronic cough 06/12/2020   Constipation    CAP (community acquired pneumonia) 02/09/2020   Acute on chronic respiratory failure with hypoxia ([place]) 07/06/2019   Fall at home, initial encounter 07/06/2019   Orthostatic hypotension 07/06/2019   Rib fractures 07/04/2019   Pneumonia due to COVID-19 virus 06/11/2019   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[place]) 05/07/2019   Acute asthma exacerbation 12/18/2018   Elevated troponin 12/18/2018   Acute bronchitis 08/30/2018   Chronic respiratory failure with hypoxia ([place]) 04/06/2018    Essential hypertension 03/24/2018   Depression 03/24/2018   Asthma exacerbation 03/23/2018   DOE (dyspnea on exertion) 03/21/2018   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 esophagitis 03/21/2018   Chronic lung disease    Hypoxia    Dyspnea 01/23/2018   ILD (interstitial lung disease) ([place]) 01/23/2018   Claw toe, acquired, 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   Restrictive lung disease 07/18/2013   Multiple pulmonary nodules/RA lung dz  05/29/2013   Cough variant asthma vs uacs  05/29/2013   Rheumatoid arthritis ([place]) 01/13/2012   Osteopenia 07/37/1062   Lichen sclerosus et atrophicus of the vulva 01/13/2012   Asthma 01/13/2012    ONSET DATE: 03/04/2022 (MD referral)  REFERRING DIAG: R42 (ICD-10-CM) - Dizziness  THERAPY DIAG:  Unsteadiness on feet  Other abnormalities of gait and mobility  Dizziness and giddiness  Rationale for Evaluation and Treatment Rehabilitation  SUBJECTIVE:   SUBJECTIVE STATEMENT: Almost fell this morning, stepping down on the sidewalk and if friend hadn't had caught me, I would have been on the floor.   Pt accompanied by: self  PERTINENT HISTORY: See above for PMH   PAIN:  Are you having pain? No  PRECAUTIONS: None  FALLS: Has patient fallen in last 6 months? Yes. Number of falls 2  LIVING ENVIRONMENT: Lives with: lives with their spouse Lives in: House/apartment Stairs: Yes:  Internal: 2nd leve, but bedroom on first floor steps; has elevator and External: 2 steps; none Has following equipment at home: Single point cane, but does not use  PLOF: Independent  PATIENT GOALS To feel better and get my balance back  OBJECTIVE:    TODAY'S TREATMENT: 03/31/2022 Activity Comments  Corner balance exercises:  feet together EC 30 sec x 2; feet apart with head turns/nods x 5 each; semitandem with head steady EO x 30 sec Increased sway with eyes closed  Stagger stance forward/back rocking for active ankle motion, 2 x 10 reps To address hip/ankle strategy work for balance  Wide BOS lateral  weightshifting for hip/ankle strategy work   Step strategy work:  forward, back, side /return to midline with UE support More difficulty with LLE as stepping leg, initial LOB with LLE as stepping  Forward step ups, forward step downs at 4" step, UE support   Practiced steps with step-through pattern, then step-downs simulating curb step with light UE support Pt with more difficulty stepping down with LLE leading than with RLE leading.  PT provides cues for leading RLE stepping down curbs and needing external support (cane vs. Rollator)  Pt noted to have L knee recurvatum with standing exercises; assessed leg length in supine, no apparent LLD.  Access Code: [email or web address] URL: [email or web address] Date: 03/31/2022 Prepared by: [facility]  Program Notes perform exercises in a corner for safety!  Exercises - Seated Gaze Stabilization with Head Rotation  - 1 x daily - 7 x weekly - 3-5 sets - 30 sec hold - Corner Balance Feet Together With Eyes Closed  - 1 x daily - 7 x weekly - 1-3 sets - -30 sec hold - Corner Balance Feet Apart: Eyes Open With Head Turns  - 1 x daily - 7 x weekly - 5-10 reps - Semi-Tandem Corner Balance With Eyes Open  - 1 x daily - 5 x weekly - 2 sets - 30 sec hold - Seated Hip Abduction  - 1 x daily - 5 x weekly - 2 sets - 10 reps - Seated Hip Adduction Isometrics with Ball  - 1 x daily - 5 x weekly - 2 sets - 10 reps - 3 sec hold - Seated Ankle Inversion Eversion PROM  - 1 x daily - 5 x weekly - 2 sets - 30 sec hold - Ankle Inversion Eversion Towel Slide  - 1 x daily - 5 x weekly - 2 sets - 10 reps - Staggered Stance Forward Backward Weight Shift with Counter Support  - 1 x daily - 5 x weekly - 1-2 sets - 10 reps - Alternating Step Forward with Support  - 1 x daily - 7 x weekly - 1-2 sets - 10 reps - Alternating Step Backward with Support  - 1 x daily - 7 x weekly - 1-2 sets - 10 reps - Side Stepping with Counter Support  - 1 x  daily - 7 x weekly - 1-2 sets - 10 reps  PATIENT EDUCATION: Education details: Additions to HEP-rationale for hip/ankle/step strategy work for balance Person educated: Patient Education method: Consulting civil engineer, Media planner, and Handouts Education comprehension: verbalized understanding and returned demonstration    LOWER EXTREMITY MMT:    MMT (in sitting) Right eval Left eval  Hip flexion 4 4-  Hip extension    Hip abduction 3+ 3+  Hip adduction 4- 4-  Hip internal rotation    Hip external rotation    Knee  flexion 4 4-  Knee extension 4 4  Ankle dorsiflexion 4- 4  Ankle plantarflexion 2+ (0 reps) 14 reps in standing  Ankle inversion 2 4  Ankle eversion 4+ 4+   (Blank rows = not tested)     HOME EXERCISE PROGRAM Last updated: 03/25/22 Access Code: [email or web address] URL: [email or web address] Date: 03/25/2022 Prepared by: [facility]  Program Notes perform exercises in a corner for safety!  Exercises - Seated Gaze Stabilization with Head Rotation  - 1 x daily - 7 x weekly - 3-5 sets - 30 sec hold - Standing Near Stance in Corner  - 1 x daily - 7 x weekly - 1-3 sets - 30 sec hold - Corner Balance Feet Together With Eyes Closed  - 1 x daily - 7 x weekly - 1-3 sets - -30 sec hold - Corner Balance Feet Apart: Eyes Open With Head Turns  - 1 x daily - 7 x weekly - 5-10 reps - Standing Romberg to 1/2 Tandem Stance  - 1 x daily - 5 x weekly - 2 sets - 30 sec hold - Semi-Tandem Corner Balance With Eyes Open  - 1 x daily - 5 x weekly - 2 sets - 30 sec hold - Seated Hip Abduction  - 1 x daily - 5 x weekly - 2 sets - 10 reps - Heel Raises with Counter Support  - 1 x daily - 5 x weekly - 2 sets - 10 reps - Seated Hip Adduction Isometrics with Ball  - 1 x daily - 5 x weekly - 2 sets - 10 reps - 3 sec hold - Seated Ankle Inversion Eversion PROM  - 1 x daily - 5 x weekly - 2 sets - 30 sec hold - Ankle Inversion Eversion Towel Slide  - 1 x  daily - 5 x weekly - 2 sets - 10 reps      Below measures were taken at time of initial evaluation unless otherwise specified:  DIAGNOSTIC FINDINGS:  Per CT 12/2021:  Disc levels:  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 No acute intracranial abnormality. 2. No evidence of acute cervical spine fracture or traumatic malalignment.  COGNITION: Overall cognitive status: Within functional limits for tasks assessed   Cervical ROM:  WFL  Active A/PROM (deg) eval  Flexion   Extension   Right lateral flexion   Left lateral flexion   Right rotation   Left rotation   (Blank rows = not tested)  STRENGTH: Decreased functional strength, per 5x sit<>stand  TRANSFERS: Assistive device utilized: None  Sit to stand: Modified independence Stand to sit: Modified independence  GAIT: Gait pattern: step through pattern and wide BOS Distance walked: 60 ft Assistive device utilized: None Level of assistance: SBA Comments: With turn around furniture, mild LOB towards R side, able to self-recover  FUNCTIONAL TESTs:  5 times sit to stand: 22.22 Dynamic Gait Index: 11/24  M-CTSIB  Condition 1: Firm Surface, EO 30 Sec, Normal Sway  Condition 2: Firm Surface, EC 30 Sec, Mild Sway  Condition 3: Foam Surface, EO 30 Sec, Moderate Sway  Condition 4: Foam Surface, EC 9.31 Sec, Severe Sway    PATIENT SURVEYS:  FOTO Intake score = 50; predicted score = 58   VESTIBULAR ASSESSMENT   GENERAL OBSERVATION: Unsteadiness, cautious with gait    SYMPTOM BEHAVIOR:   Subjective history: Reports that her balance has been off for several years.  Pretty immediate symptoms  of imbalance, need to grab for support   Non-Vestibular symptoms:  falls   Type of dizziness: Diplopia, Imbalance (Disequilibrium), Spinning/Vertigo, and Unsteady with head/body turns   Frequency: several times per week    Duration: approx 5 minutes   Aggravating factors: Induced by motion: occur when  walking, bending down to the ground, turning body quickly, and turning head quickly   Relieving factors:  sit down   Progression of symptoms: worse   OCULOMOTOR EXAM:   Ocular Alignment: normal   Ocular ROM:  slightly limited R and L; moves head with eye motion initially   Spontaneous Nystagmus: absent   Gaze-Induced Nystagmus: absent   Smooth Pursuits: intact   Saccades: intact   Convergence/Divergence: decreased convergence noted     VESTIBULAR - OCULAR REFLEX:    Slow VOR: Normal   VOR Cancellation: Comment: limited head turns to keep eyes focused on target   Head-Impulse Test: HIT Right: negative HIT Left: positive, no c/o   Dynamic Visual Acuity:  Attempted, but not able to be assessed, due to pt not having her glasses on      MOTION SENSITIVITY:    Motion Sensitivity Quotient  Intensity: 0 = none, 1 = Lightheaded, 2 = Mild, 3 = Moderate, 4 = Severe, 5 = Vomiting  Intensity  1. Sitting to supine   2. Supine to L side   3. Supine to R side   4. Supine to sitting   5. L Hallpike-Dix   6. Up from L    7. R Hallpike-Dix   8. Up from R    9. Sitting, head  tipped to L knee   10. Head up from L  knee   11. Sitting, head  tipped to R knee   12. Head up from R  knee   13. Sitting head turns x5   14.Sitting head nods x5   15. In stance, 180  turn to L    16. In stance, 180  turn to R      PATIENT EDUCATION: Education details: Eval results, POC Person educated: Patient Education method: Explanation Education comprehension: verbalized understanding   GOALS: Goals reviewed with patient? Yes  SHORT TERM GOALS: = LTGs   LONG TERM GOALS: Target date: 04/10/2022  Pt will be independent with HEP for improved balance, transfers, gait and overall c/o decreased dizziness Baseline:  Goal status: IN PROGRESS  2.  Pt will improve 5x sit<>stand to less than or equal to 15 sec to demonstrate improved functional strength and transfer efficiency. Baseline: 22.22  sec Goal status: IN PROGRESS  3.  Pt will Condition 4 on MCTSIB, for 30 seconds with mild-moderate sway, to demonstrate improved vestibular system use for balance. Baseline: 9.31 sec prior to opening eyes Goal status: IN PROGRESS  4. Pt will improve DGI score by at least 4 points to decrease fall risk. Baseline: 11/24 03/12/22 Goal status: IN PROGRESS  5.  Pt will improve FOTO score to 58 for improved overall functional mobility and decreased unsteadiness. Baseline:  Goal status: IN PROGRESS  ASSESSMENT:  CLINICAL IMPRESSION: Pt comes to therapy today without assistive device and she reports she had a near fall outside of a restaurant this morning stepping down a curb.  Worked on hip/ankle/step strategy work today, progressing pt's HEP, and also simulated curb work on steps in gym with step ups and step downs.  When LLE is leading leg with step strategy and step ups/downs, she has more difficulty with balance.  Discussed  again benefits of having external support such as rollator for improved stability.  She will continue to benefit from skilled PT towards goals for improved functional mobility and decreased fall risk.    OBJECTIVE IMPAIRMENTS Abnormal gait, decreased balance, decreased knowledge of use of DME, decreased mobility, difficulty walking, decreased strength, and dizziness.   ACTIVITY LIMITATIONS transfers and locomotion level  PARTICIPATION LIMITATIONS: shopping and community activity  PERSONAL FACTORS 3+ comorbidities: See above  are also affecting patient's functional outcome.   REHAB POTENTIAL: Good  CLINICAL DECISION MAKING: Evolving/moderate complexity  EVALUATION COMPLEXITY: Moderate   PLAN: PT FREQUENCY: 2x/week  PT DURATION: other: 5 weeks, including eval week  PLANNED INTERVENTIONS: Therapeutic exercises, Therapeutic activity, Neuromuscular re-education, Balance training, Gait training, Patient/Family education, Self Care, Joint mobilization, Vestibular  training, Canalith repositioning, and DME instructions  PLAN FOR NEXT SESSION:  Pt to be out until next week due to family visiting in town.  Need to assess goals by end of week next week and discuss POC.  Review additions to HEP and work on outdoor/unlevel surfaces, especially curb steps.     Mady Haagensen, PT 03/31/22 12:32 PM Phone: [phone] Fax: [phone]   [facility] at [facility] [place], [address] [place], [place] [address] Phone # [phone] Fax # [phone]

## 2022-04-02 ENCOUNTER — Ambulatory Visit: Payer: Medicare Other | Admitting: Physical Therapy

## 2022-04-03 DIAGNOSIS — R42 Dizziness and giddiness: Secondary | ICD-10-CM | POA: Diagnosis not present

## 2022-04-03 DIAGNOSIS — R296 Repeated falls: Secondary | ICD-10-CM | POA: Diagnosis not present

## 2022-04-03 DIAGNOSIS — M810 Age-related osteoporosis without current pathological fracture: Secondary | ICD-10-CM | POA: Diagnosis not present

## 2022-04-03 DIAGNOSIS — M0510 Rheumatoid lung disease with rheumatoid arthritis of unspecified site: Secondary | ICD-10-CM | POA: Diagnosis not present

## 2022-04-03 DIAGNOSIS — D692 Other nonthrombocytopenic purpura: Secondary | ICD-10-CM | POA: Diagnosis not present

## 2022-04-03 DIAGNOSIS — E785 Hyperlipidemia, unspecified: Secondary | ICD-10-CM | POA: Diagnosis not present

## 2022-04-03 DIAGNOSIS — J849 Interstitial pulmonary disease, unspecified: Secondary | ICD-10-CM | POA: Diagnosis not present

## 2022-04-03 DIAGNOSIS — B0223 Postherpetic polyneuropathy: Secondary | ICD-10-CM | POA: Diagnosis not present

## 2022-04-03 DIAGNOSIS — N898 Other specified noninflammatory disorders of vagina: Secondary | ICD-10-CM | POA: Diagnosis not present

## 2022-04-03 DIAGNOSIS — I10 Essential (primary) hypertension: Secondary | ICD-10-CM | POA: Diagnosis not present

## 2022-04-03 DIAGNOSIS — I700 Atherosclerosis of aorta: Secondary | ICD-10-CM | POA: Diagnosis not present

## 2022-04-03 DIAGNOSIS — D8989 Other specified disorders involving the immune mechanism, not elsewhere classified: Secondary | ICD-10-CM | POA: Diagnosis not present

## 2022-04-07 ENCOUNTER — Ambulatory Visit: Payer: Medicare Other | Admitting: Physical Therapy

## 2022-04-08 ENCOUNTER — Encounter: Payer: Self-pay | Admitting: Physical Therapy

## 2022-04-08 ENCOUNTER — Ambulatory Visit: Payer: Medicare Other | Admitting: Physical Therapy

## 2022-04-08 DIAGNOSIS — R2681 Unsteadiness on feet: Secondary | ICD-10-CM

## 2022-04-08 DIAGNOSIS — R2689 Other abnormalities of gait and mobility: Secondary | ICD-10-CM | POA: Diagnosis not present

## 2022-04-08 DIAGNOSIS — R42 Dizziness and giddiness: Secondary | ICD-10-CM | POA: Diagnosis not present

## 2022-04-08 NOTE — Therapy (Signed)
OUTPATIENT PHYSICAL THERAPY VESTIBULAR/NEURO TREATMENT     Patient Name: Stacey [name] MRN: [id number] DOB:May 18, 1941, 81 y.o., female Today's Date: 04/08/2022  PCP: Shon Baton, MD REFERRING PROVIDER: Raylene Miyamoto, MD   PT End of Session - 04/08/22 0850     Visit Number 8    Number of Visits 10    Date for PT Re-Evaluation 04/10/22    Authorization Type Medicare/BCBS    PT Start Time 0849    PT Stop Time 0930    PT Time Calculation (min) 41 min    Equipment Utilized During Treatment Gait belt    Activity Tolerance Patient tolerated treatment well    Behavior During Therapy WFL for tasks assessed/performed                  Past Medical History:  Diagnosis Date   Abnormal finding of blood chemistry    Asthma    H/O measles    H/O varicella    Hypertension    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[place])    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   Lichen sclerosus 39/03/00   Asymptomatic   Low iron    Mitral valve prolapse    Osteoarthritis    Osteoporosis    Pneumonia    Post herpetic neuralgia    Rheumatoid arthritis(714.0)    Yeast infection    Past Surgical History:  Procedure Laterality Date   CHOLECYSTECTOMY  2011   IR ANGIO INTRA EXTRACRAN SEL COM CAROTID INNOMINATE BILAT MOD SED  06/18/2020   IR ANGIO VERTEBRAL SEL SUBCLAVIAN INNOMINATE UNI R MOD SED  06/18/2020   IR ANGIO VERTEBRAL SEL VERTEBRAL UNI L MOD SED  06/18/2020   WISDOM TOOTH EXTRACTION     Patient Active Problem List   Diagnosis Date Noted   Chronic cough 06/12/2020   Constipation    CAP (community acquired pneumonia) 02/09/2020   Acute on chronic respiratory failure with hypoxia ([place]) 07/06/2019   Fall at home, initial encounter 07/06/2019   Orthostatic hypotension 07/06/2019   Rib fractures 07/04/2019   Pneumonia due to COVID-19 virus 06/11/2019   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[place]) 05/07/2019   Acute asthma exacerbation 12/18/2018   Elevated troponin 12/18/2018   Acute bronchitis 08/30/2018    Chronic respiratory failure with hypoxia ([place]) 04/06/2018   Essential hypertension 03/24/2018   Depression 03/24/2018   Asthma exacerbation 03/23/2018   DOE (dyspnea on exertion) 03/21/2018   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 esophagitis 03/21/2018   Chronic lung disease    Hypoxia    Dyspnea 01/23/2018   ILD (interstitial lung disease) ([place]) 01/23/2018   Claw toe, acquired, 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   Restrictive lung disease 07/18/2013   Multiple pulmonary nodules/RA lung dz  05/29/2013   Cough variant asthma vs uacs  05/29/2013   Rheumatoid arthritis ([place]) 01/13/2012   Osteopenia 92/33/0076   Lichen sclerosus et atrophicus of the vulva 01/13/2012   Asthma 01/13/2012    ONSET DATE: 03/04/2022 (MD referral)  REFERRING DIAG: R42 (ICD-10-CM) - Dizziness  THERAPY DIAG:  Unsteadiness on feet  Other abnormalities of gait and mobility  Rationale for Evaluation and Treatment Rehabilitation  SUBJECTIVE:   SUBJECTIVE STATEMENT: No other falls, near falls.  Daughter in town and helped me get a new cane with rubber tip. Pt accompanied by: self  PERTINENT HISTORY: See above for PMH   PAIN:  Are you having pain? No  PRECAUTIONS: None  FALLS: Has patient fallen in last 6 months? Yes. Number of falls 2  LIVING ENVIRONMENT: Lives with: lives with  their spouse Lives in: House/apartment Stairs: Yes: Internal: 2nd leve, but bedroom on first floor steps; has elevator and External: 2 steps; none Has following equipment at home: Single point cane, but does not use  PLOF: Independent  PATIENT GOALS To feel better and get my balance back  OBJECTIVE:    TODAY'S TREATMENT: 04/08/2022 Activity Comments  5x sit<>stand: 19 sec  Improved from 22.22 sec  SLS:  1 sec or less, in multiple trials R and LLE   Step ups to 6" step, LUE support, x 10 reps   Stair negotiation-pt ascends, descends steps with step through pattern with 1 rail Supervision/mod I  SLS, 3 x 10 sec with UE support Cues for UE support for  stability  Reviewed stagger stance weightshifting and step strategy exercises as part of HEP Cues for technique   Simulated curb negotiation with cane (in clinic on 6" step), cues for cane and step placement, performed x 4 reps Min guard/supervision; pt prefers to lead down with LLE with more stability noted    M-CTSIB  Condition 1: Firm Surface, EO 30 Sec, Normal Sway  Condition 2: Firm Surface, EC 30 Sec, Moderate Sway  Condition 3: Foam Surface, EO 30 Sec, Mild Sway  Condition 4: Foam Surface, EC 5.28 Sec, Severe Sway      Access Code: [email or web address] URL: [email or web address] Date: 04/08/2022-most recent updates Prepared by: [facility]  Program Notes perform exercises in a corner for safety!  Exercises - Corner Balance Feet Together With Eyes Closed  - 1 x daily - 7 x weekly - 1-3 sets - -30 sec hold - Staggered Stance Forward Backward Weight Shift with Counter Support  - 1 x daily - 5 x weekly - 1-2 sets - 10 reps - Alternating Step Forward with Support  - 1 x daily - 7 x weekly - 1-2 sets - 10 reps - Alternating Step Backward with Support  - 1 x daily - 7 x weekly - 1-2 sets - 10 reps - Side Stepping with Counter Support  - 1 x daily - 7 x weekly - 1-2 sets - 10 reps - Single Leg Stance with Support  - 1 x daily - 5 x weekly - 1 sets - 3 reps - 10 sec hold - Forward Step Up  - 1 x daily - 5 x weekly - 10 sets - 10 reps   PATIENT EDUCATION: Education details: Discussed importance of HEP and consistent performance (pt does verbalize having too many exercises, so PT consolidated and took out multiple exercises today).  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 No acute intracranial abnormality. 2. No evidence of acute cervical spine fracture or traumatic malalignment.  COGNITION: Overall cognitive status: Within functional limits for tasks assessed   Cervical ROM:  WFL  Active A/PROM (deg) eval  Flexion   Extension   Right lateral flexion   Left lateral flexion   Right rotation   Left rotation   (Blank rows = not tested)  STRENGTH: Decreased functional strength, per 5x sit<>stand  TRANSFERS: Assistive device utilized: None  Sit to stand: Modified  independence Stand to sit: Modified independence  GAIT: Gait pattern: step through pattern and wide BOS Distance walked: 60 ft Assistive device utilized: None Level of assistance: SBA Comments: With turn around furniture, mild LOB towards R side, able to self-recover  FUNCTIONAL TESTs:  5 times sit to stand: 22.22 Dynamic Gait Index: 11/24  M-CTSIB  Condition 1: Firm Surface, EO 30 Sec, Normal Sway  Condition 2: Firm Surface, EC 30 Sec, Mild Sway  Condition 3: Foam Surface, EO 30 Sec, Moderate Sway  Condition 4: Foam Surface, EC 9.31 Sec, Severe Sway    PATIENT SURVEYS:  FOTO Intake score = 50; predicted score = 58   VESTIBULAR ASSESSMENT   GENERAL OBSERVATION: Unsteadiness, cautious with gait    SYMPTOM BEHAVIOR:   Subjective history: Reports that her balance has been off for several years.  Pretty immediate symptoms of imbalance, need to grab for support   Non-Vestibular symptoms:  falls   Type of dizziness: Diplopia, Imbalance (Disequilibrium), Spinning/Vertigo, and Unsteady with head/body turns   Frequency: several times per week    Duration: approx 5 minutes   Aggravating  factors: Induced by motion: occur when walking, bending down to the ground, turning body quickly, and turning head quickly   Relieving factors:  sit down   Progression of symptoms: worse   OCULOMOTOR EXAM:   Ocular Alignment: normal   Ocular ROM:  slightly limited R and L; moves head with eye motion initially   Spontaneous Nystagmus: absent   Gaze-Induced Nystagmus: absent   Smooth Pursuits: intact   Saccades: intact   Convergence/Divergence: decreased convergence noted     VESTIBULAR - OCULAR REFLEX:    Slow VOR: Normal   VOR Cancellation: Comment: limited head turns to keep eyes focused on target   Head-Impulse Test: HIT Right: negative HIT Left: positive, no c/o   Dynamic Visual Acuity:  Attempted, but not able to be assessed, due to pt not having her glasses on      MOTION SENSITIVITY:    Motion Sensitivity Quotient  Intensity: 0 = none, 1 = Lightheaded, 2 = Mild, 3 = Moderate, 4 = Severe, 5 = Vomiting  Intensity  1. Sitting to supine   2. Supine to L side   3. Supine to R side   4. Supine to sitting   5. L Hallpike-Dix   6. Up from L    7. R Hallpike-Dix   8. Up from R    9. Sitting, head  tipped to L knee   10. Head up from L  knee   11. Sitting, head  tipped to R knee   12. Head up from R  knee   13. Sitting head turns x5   14.Sitting head nods x5   15. In stance, 180  turn to L    16. In stance, 180  turn to R      PATIENT EDUCATION: Education details: Eval results, POC Person educated: Patient Education method: Explanation Education comprehension: verbalized understanding   GOALS: Goals reviewed with patient? Yes  SHORT TERM GOALS: = LTGs   LONG TERM GOALS: Target date: 04/10/2022  Pt will be independent with HEP for improved balance, transfers, gait and overall c/o decreased dizziness Baseline:  Goal status: IN PROGRESS  2.  Pt will improve 5x sit<>stand to less than or equal to 15 sec to demonstrate improved functional strength and  transfer efficiency. Baseline: 22.22 sec>19. Seconds 04/08/2022 Goal status: PARTIALLY MET/ONGOING  3.  Pt will Condition 4 on  MCTSIB, for 30 seconds with mild-moderate sway, to demonstrate improved vestibular system use for balance. Baseline: 9.31 sec prior to opening eyes. 5.28 sec, 7.25 sec 04/08/2022 Goal status: NOT MET/ONGOING  4. Pt will improve DGI score by at least 4 points to decrease fall risk. Baseline: 11/24 03/12/22 Goal status: IN PROGRESS  5.  Pt will improve FOTO score to 58 for improved overall functional mobility and decreased unsteadiness. Baseline:  Goal status: IN PROGRESS  ASSESSMENT:  CLINICAL IMPRESSION: Began assessing STGs this visit, with pt partially meeting LTG 2 (improved 5x sit<>stand score, just not to goal level).  Pt has not met LTG 3 for balance vision removed on compliant surface.  Pt does state she has not been consistent with HEP, and she attributes this to having too many exercises in HEP.  PT addressed this concern today, but consolidating HEP to address step strategy for balance, SLS and step ups for functional lower extremity strength for balance.  She does come to therapy today with small base rubber tip cane, which does seem to aid in her stability.  Did not have enough time for gait training other than curb with it today.  Pt's balance deficits appear to be multi-factorial (decreased vestibular system use for balance, heavy reliance on vision, hx of foot surgery/limitations, decreased functional strength), and pt is making slow, steady progress towards goals.  She remains at fall risk and would continue to benefit from skilled PT to further address balance and fall risk, beyond this week.  OBJECTIVE IMPAIRMENTS Abnormal gait, decreased balance, decreased knowledge of use of DME, decreased mobility, difficulty walking, decreased strength, and dizziness.   ACTIVITY LIMITATIONS transfers and locomotion level  PARTICIPATION LIMITATIONS: shopping and  community activity  PERSONAL FACTORS 3+ comorbidities: See above  are also affecting patient's functional outcome.   REHAB POTENTIAL: Good  CLINICAL DECISION MAKING: Evolving/moderate complexity  EVALUATION COMPLEXITY: Moderate   PLAN: PT FREQUENCY: 2x/week  PT DURATION: other: 5 weeks, including eval week  PLANNED INTERVENTIONS: Therapeutic exercises, Therapeutic activity, Neuromuscular re-education, Balance training, Gait training, Patient/Family education, Self Care, Joint mobilization, Vestibular training, Canalith repositioning, and DME instructions  PLAN FOR NEXT SESSION:  Check remaining goals and plan for recert next visit to continue POC (already had pt schedule).  Check pt's updated HEP.  Plan to add back in eyes closed exercises as appropriate.  Continue to work on gait training with cane on outdoor/unlevel surfaces, especially curb steps.     Mady Haagensen, PT 04/08/22 2:28 PM Phone: [phone] Fax: [phone]   [facility] [place], [place] [address] Phone # [phone] Fax # [phone]

## 2022-04-09 NOTE — Therapy (Signed)
OUTPATIENT PHYSICAL THERAPY VESTIBULAR PROGRESS NOTE/RE-CERT     Patient Name: Stacey [name] MRN: [id number] DOB:[birth year], 81 y.o., female Today's Date: 04/10/2022  PCP: Shon Baton, MD REFERRING PROVIDER: Raylene Miyamoto, MD   Progress Note Reporting Period 03/10/22 to 04/10/22  See note below for Objective Data and Assessment of Progress/Goals.       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   Lichen sclerosus 58/09/98   Asymptomatic   Low iron    Mitral valve prolapse    Osteoarthritis    Osteoporosis    Pneumonia    Post herpetic neuralgia    Rheumatoid arthritis(714.0)    Yeast infection    Past Surgical History:  Procedure Laterality Date   CHOLECYSTECTOMY  2011   IR ANGIO INTRA EXTRACRAN SEL COM CAROTID INNOMINATE BILAT MOD SED  06/18/2020   IR ANGIO VERTEBRAL SEL SUBCLAVIAN INNOMINATE UNI R MOD SED  06/18/2020   IR ANGIO VERTEBRAL SEL VERTEBRAL UNI L MOD SED  06/18/2020   WISDOM TOOTH EXTRACTION     Patient Active Problem List   Diagnosis Date Noted   Chronic cough 06/12/2020   Constipation    CAP (community acquired pneumonia) 02/09/2020   Acute on chronic respiratory failure with hypoxia ([place]) 07/06/2019   Fall at home, initial encounter 07/06/2019   Orthostatic hypotension 07/06/2019   Rib fractures 07/04/2019   Pneumonia due to COVID-19 virus 06/11/2019    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[place]) 05/07/2019   Acute asthma exacerbation 12/18/2018   Elevated troponin 12/18/2018   Acute bronchitis 08/30/2018   Chronic respiratory failure with hypoxia ([place]) 04/06/2018   Essential hypertension 03/24/2018   Depression 03/24/2018   Asthma exacerbation 03/23/2018   DOE (dyspnea on exertion) 03/21/2018   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 esophagitis 03/21/2018   Chronic lung disease    Hypoxia    Dyspnea 01/23/2018   ILD (interstitial lung disease) ([place]) 01/23/2018   Claw toe, acquired, 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   Restrictive lung disease 07/18/2013   Multiple pulmonary nodules/RA lung dz  05/29/2013   Cough variant asthma vs uacs  05/29/2013   Rheumatoid arthritis ([place]) 01/13/2012   Osteopenia 33/82/5053   Lichen sclerosus et atrophicus of the vulva 01/13/2012   Asthma 01/13/2012    ONSET DATE: 03/04/2022 (MD referral)  REFERRING DIAG: R42 (ICD-10-CM) - Dizziness  THERAPY DIAG:  Unsteadiness on feet  Other abnormalities of gait and mobility  Dizziness and giddiness  Rationale for Evaluation and Treatment Rehabilitation  SUBJECTIVE:   SUBJECTIVE STATEMENT: Denies recent falls. Getting a few vaccines later today. Reports dizziness has been getting worse for the past year.  Pt accompanied by: self  PERTINENT HISTORY: See above for PMH  PAIN:  Are you having pain? No  PRECAUTIONS: None  FALLS: Has patient fallen in last 6 months? Yes. Number of falls 2  LIVING ENVIRONMENT: Lives with: lives with their spouse Lives in: House/apartment Stairs: Yes: Internal: 2nd leve, but bedroom on first floor steps; has elevator and External: 2 steps; none Has following equipment at home: Single point cane, but does not use  PLOF: Independent  PATIENT GOALS To feel better and get my balance back  OBJECTIVE:      TODAY'S TREATMENT: 04/10/22 Activity Comments  FOTO [id number]  5xSTS 25.13 sec without Ues; c/o fatigue  DGI 13/24  HIT B positive; c/o dizziness   Sitting head turns to targets 30" Cues to increase pace          M-CTSIB  Condition 1: Firm Surface, EO 30 Sec, Moderate Sway  Condition 2: Firm Surface, EC 16 Sec, Moderate and Severe Sway  Condition 3: Foam Surface, EO 30 Sec, Moderate and Severe Sway  Condition 4: Foam Surface, EC 4 Sec, Severe Sway     [facility] PT Assessment - 04/10/22 0001       Dynamic Gait Index   Level Surface Mild Impairment    Change in Gait Speed Mild Impairment    Gait with Horizontal Head Turns Moderate Impairment   c/o dizziness   Gait with Vertical Head Turns Moderate Impairment   c/o dizziness   Gait and Pivot Turn Moderate Impairment   dizziness   Step Over Obstacle Normal    Step Around Obstacles Moderate Impairment   dizziness   Steps Mild Impairment    Total Score 13             HOME EXERCISE PROGRAM Last updated: 04/10/22 Access Code: [email or web address] URL: [email or web address] Date: 04/10/2022 Prepared by: [facility]  Program Notes perform exercises in a corner for safety!  Exercises - Corner Balance Feet Together With Eyes Closed  - 1 x daily - 7 x weekly - 1-3 sets - -30 sec hold - Staggered Stance Forward Backward Weight Shift with Counter Support  - 1 x daily - 5 x weekly - 1-2 sets - 10 reps - Alternating Step Forward with Support  - 1 x daily - 7 x weekly - 1-2 sets - 10 reps - Alternating Step Backward with Support  - 1 x daily - 7 x weekly - 1-2 sets - 10 reps - Side Stepping with Counter Support  - 1 x daily - 7 x weekly - 1-2 sets - 10 reps - Single Leg Stance with Support  - 1 x daily - 5 x weekly - 1 sets - 3 reps - 10 sec hold - Forward Step Up  - 1 x daily - 5 x weekly - 10 sets - 10 reps - Seated Left Head Turns Vestibular Habituation  - 1 x daily - 5 x weekly - 2 sets - 30 sec hold - Seated Head Nod  - 1 x daily - 5 x weekly - 2 sets - 30 sec hold   PATIENT EDUCATION: Education details: HEP update, edu on today's  findings and progress with PT Person educated: Patient Education method: Explanation, Demonstration, Tactile cues, Verbal cues, and Handouts Education comprehension: verbalized understanding and returned demonstration   ------------------------------------------------------------------  Below measures were taken at time of initial evaluation unless otherwise specified:  DIAGNOSTIC FINDINGS:  Per CT 12/2021:  Disc levels:  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 No acute intracranial abnormality. 2. No evidence of acute cervical spine fracture or traumatic malalignment.  COGNITION: Overall cognitive status: Within functional limits for tasks assessed   Cervical ROM:  WFL  Active A/PROM (deg) eval  Flexion   Extension   Right lateral flexion   Left lateral flexion   Right rotation   Left rotation   (Blank rows = not tested)  STRENGTH: Decreased functional strength, per 5x sit<>stand  TRANSFERS: Assistive device utilized: None  Sit to stand: Modified independence Stand to sit: Modified independence  GAIT: Gait pattern: step through pattern and wide BOS Distance walked: 60 ft Assistive device utilized: None Level of assistance: SBA Comments: With turn around furniture, mild LOB towards R side, able to self-recover  FUNCTIONAL TESTs:  5 times sit to stand: 22.22 Dynamic Gait Index: 11/24  M-CTSIB  Condition 1: Firm Surface, EO 30 Sec, Normal Sway  Condition 2: Firm Surface, EC 30 Sec, Mild Sway  Condition 3: Foam Surface, EO 30 Sec, Moderate Sway  Condition 4: Foam Surface, EC 9.31 Sec, Severe Sway    PATIENT SURVEYS:  FOTO Intake score = 50; predicted score = 58   VESTIBULAR ASSESSMENT   GENERAL OBSERVATION: Unsteadiness, cautious with gait    SYMPTOM BEHAVIOR:   Subjective history: Reports that her balance has been off for several years.  Pretty immediate symptoms of imbalance, need to grab for support   Non-Vestibular  symptoms:  falls   Type of dizziness: Diplopia, Imbalance (Disequilibrium), Spinning/Vertigo, and Unsteady with head/body turns   Frequency: several times per week    Duration: approx 5 minutes   Aggravating factors: Induced by motion: occur when walking, bending down to the ground, turning body quickly, and turning head quickly   Relieving factors:  sit down   Progression of symptoms: worse   OCULOMOTOR EXAM:   Ocular Alignment: normal   Ocular ROM:  slightly limited R and L; moves head with eye motion initially   Spontaneous Nystagmus: absent   Gaze-Induced Nystagmus: absent   Smooth Pursuits: intact   Saccades: intact   Convergence/Divergence: decreased convergence noted     VESTIBULAR - OCULAR REFLEX:    Slow VOR: Normal   VOR Cancellation: Comment: limited head turns to keep eyes focused on target   Head-Impulse Test: HIT Right: negative HIT Left: positive, no c/o   Dynamic Visual Acuity:  Attempted, but not able to be assessed, due to pt not having her glasses on      MOTION SENSITIVITY:    Motion Sensitivity Quotient  Intensity: 0 = none, 1 = Lightheaded, 2 = Mild, 3 = Moderate, 4 = Severe, 5 = Vomiting  Intensity  1. Sitting to supine   2. Supine to L side   3. Supine to R side   4. Supine to sitting   5. L Hallpike-Dix   6. Up from L    7. R Hallpike-Dix   8. Up from R    9. Sitting, head  tipped to L knee   10. Head up from L  knee   11. Sitting, head  tipped to R knee   12. Head up from R  knee   13. Sitting head turns x5   14.Sitting head nods x5   15. In stance, 180  turn to L    16. In stance, 180  turn to R      PATIENT EDUCATION: Education details: Eval results, POC Person educated: Patient Education method: Explanation Education comprehension: verbalized understanding  GOALS: Goals reviewed with patient? Yes  SHORT TERM GOALS: = LTGs   LONG TERM GOALS: Target date: 05/08/2022  Pt will be independent with HEP for improved  balance, transfers, gait and overall c/o decreased dizziness Baseline:  Goal status: IN PROGRESS 04/10/22  2.  Pt will improve 5x sit<>stand to less than or equal to 15 sec to demonstrate improved functional strength and transfer efficiency. Baseline: 22.22 sec>19. Seconds 04/08/2022, 25.13 sec 04/10/22 Goal status: ONGOING 04/10/22  3.  Pt will Condition 4 on MCTSIB, for 30 seconds with mild-moderate sway, to demonstrate improved vestibular system use for balance. Baseline: 9.31 sec prior to opening eyes. 5.28 sec, 7.25 sec 04/08/2022, 4 sec 04/10/22 Goal status: ONGOING 04/10/22  4. Pt will improve DGI score by at least 4 points to decrease fall risk. Baseline: 11/24 03/12/22, 15/24 04/10/22 Goal status: IN PROGRESS 04/10/22  5.  Pt will improve FOTO score to 58 for improved overall functional mobility and decreased unsteadiness. Baseline: 45 04/10/22 Goal status: IN PROGRESS 04/10/22  ASSESSMENT:  CLINICAL IMPRESSION: Patient arrived to session with report of worsening dizziness for the past year, especially with turns. Patient appears slightly more steady ambulating with quad tip cane today compared to previous sessions. Patient's score on 5xSTS has declined slightly and patient noted fatigue with this activity today. Demonstrates moderate-severe sway with multisensory balance testing, still with limited tolerance for EC/foam. Patient scored 13/24 on DGI, indicating an increased risk of falls but improved from original score of 11/24. HIT was positive B, and she reported dizziness with several turning activities, thus updated HEP with gaze stabilization. Patient is demonstrating slow but steady progress towards goals and would benefit from additional skilled PT services 2x/week for 4 weeks to address remaining goals.  OBJECTIVE IMPAIRMENTS Abnormal gait, decreased balance, decreased knowledge of use of DME, decreased mobility, difficulty walking, decreased strength, and dizziness.    ACTIVITY LIMITATIONS transfers and locomotion level  PARTICIPATION LIMITATIONS: shopping and community activity  PERSONAL FACTORS 3+ comorbidities: See above  are also affecting patient's functional outcome.   REHAB POTENTIAL: Good  CLINICAL DECISION MAKING: Evolving/moderate complexity  EVALUATION COMPLEXITY: Moderate   PLAN: PT FREQUENCY: 2x/week  PT DURATION: other: 4 weeks  PLANNED INTERVENTIONS: Therapeutic exercises, Therapeutic activity, Neuromuscular re-education, Balance training, Gait training, Patient/Family education, Self Care, Joint mobilization, Vestibular training, Canalith repositioning, and DME instructions  PLAN FOR NEXT SESSION:  work on gaze stabilization activities and turns as this does bring on dizziness and patient with + B HIT on 04/10/22; Plan to add back in eyes closed exercises as appropriate.  Continue to work on gait training with cane on outdoor/unlevel surfaces, especially curb steps.      Janene Harvey, PT, DPT 04/10/22 11:21 AM  [facility] at [facility] [address] [place], [place], [place] [address] Phone # [phone] Fax # [phone]

## 2022-04-10 ENCOUNTER — Ambulatory Visit: Payer: Medicare Other | Admitting: Physical Therapy

## 2022-04-10 ENCOUNTER — Encounter: Payer: Self-pay | Admitting: Physical Therapy

## 2022-04-10 DIAGNOSIS — R2689 Other abnormalities of gait and mobility: Secondary | ICD-10-CM | POA: Diagnosis not present

## 2022-04-10 DIAGNOSIS — R42 Dizziness and giddiness: Secondary | ICD-10-CM | POA: Diagnosis not present

## 2022-04-10 DIAGNOSIS — R2681 Unsteadiness on feet: Secondary | ICD-10-CM | POA: Diagnosis not present

## 2022-04-13 ENCOUNTER — Encounter: Payer: Self-pay | Admitting: Physical Therapy

## 2022-04-13 ENCOUNTER — Ambulatory Visit: Payer: Medicare Other | Attending: Otolaryngology | Admitting: Physical Therapy

## 2022-04-13 DIAGNOSIS — Z85828 Personal history of other malignant neoplasm of skin: Secondary | ICD-10-CM | POA: Diagnosis not present

## 2022-04-13 DIAGNOSIS — B351 Tinea unguium: Secondary | ICD-10-CM | POA: Diagnosis not present

## 2022-04-13 DIAGNOSIS — R42 Dizziness and giddiness: Secondary | ICD-10-CM | POA: Diagnosis not present

## 2022-04-13 DIAGNOSIS — R2689 Other abnormalities of gait and mobility: Secondary | ICD-10-CM | POA: Insufficient documentation

## 2022-04-13 DIAGNOSIS — L603 Nail dystrophy: Secondary | ICD-10-CM | POA: Diagnosis not present

## 2022-04-13 DIAGNOSIS — R2681 Unsteadiness on feet: Secondary | ICD-10-CM | POA: Diagnosis not present

## 2022-04-13 NOTE — Therapy (Signed)
OUTPATIENT PHYSICAL THERAPY VESTIBULAR/NEURO TREATMENT NOTE     Patient Name: Stacey [name] MRN: [id number] DOB:March 20, 1941, 81 y.o., female Today's Date: 04/13/2022  PCP: Shon Baton, MD REFERRING PROVIDER: Raylene Miyamoto, MD       PT End of Session - 04/13/22 1020     Visit Number 10    Number of Visits 17    Date for PT Re-Evaluation 05/08/22    Authorization Type Medicare/BCBS    PT Start Time 1020    PT Stop Time 1059    PT Time Calculation (min) 39 min    Equipment Utilized During Treatment Gait belt    Activity Tolerance Patient tolerated treatment well    Behavior During Therapy WFL for tasks assessed/performed                    Past Medical History:  Diagnosis Date   Abnormal finding of blood chemistry    Asthma    H/O measles    H/O varicella    Hypertension    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[place])    Leukoplakia of vulva 40/97/35   Lichen sclerosus 32/99/24   Asymptomatic   Low iron    Mitral valve prolapse    Osteoarthritis    Osteoporosis    Pneumonia    Post herpetic neuralgia    Rheumatoid arthritis(714.0)    Yeast infection    Past Surgical History:  Procedure Laterality Date   CHOLECYSTECTOMY  2011   IR ANGIO INTRA EXTRACRAN SEL COM CAROTID INNOMINATE BILAT MOD SED  06/18/2020   IR ANGIO VERTEBRAL SEL SUBCLAVIAN INNOMINATE UNI R MOD SED  06/18/2020   IR ANGIO VERTEBRAL SEL VERTEBRAL UNI L MOD SED  06/18/2020   WISDOM TOOTH EXTRACTION     Patient Active Problem List   Diagnosis Date Noted   Chronic cough 06/12/2020   Constipation    CAP (community acquired pneumonia) 02/09/2020   Acute on chronic respiratory failure with hypoxia ([place]) 07/06/2019   Fall at home, initial encounter 07/06/2019   Orthostatic hypotension 07/06/2019   Rib fractures 07/04/2019   Pneumonia due to COVID-19 virus 06/11/2019   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[place]) 05/07/2019   Acute asthma exacerbation 12/18/2018   Elevated troponin 12/18/2018   Acute  bronchitis 08/30/2018   Chronic respiratory failure with hypoxia ([place]) 04/06/2018   Essential hypertension 03/24/2018   Depression 03/24/2018   Asthma exacerbation 03/23/2018   DOE (dyspnea on exertion) 03/21/2018   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 esophagitis 03/21/2018   Chronic lung disease    Hypoxia    Dyspnea 01/23/2018   ILD (interstitial lung disease) ([place]) 01/23/2018   Claw toe, acquired, 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   Restrictive lung disease 07/18/2013   Multiple pulmonary nodules/RA lung dz  05/29/2013   Cough variant asthma vs uacs  05/29/2013   Rheumatoid arthritis ([place]) 01/13/2012   Osteopenia 26/83/4196   Lichen sclerosus et atrophicus of the vulva 01/13/2012   Asthma 01/13/2012    ONSET DATE: 03/04/2022 (MD referral)  REFERRING DIAG: R42 (ICD-10-CM) - Dizziness  THERAPY DIAG:  Unsteadiness on feet  Other abnormalities of gait and mobility  Dizziness and giddiness  Rationale for Evaluation and Treatment Rehabilitation  SUBJECTIVE:   SUBJECTIVE STATEMENT: Things going pretty well.  I'm trying, but I feel dizzy (unsteady).   Pt accompanied by: self  PERTINENT HISTORY: See above for PMH   PAIN:  Are you having pain? No  PRECAUTIONS: None  FALLS: Has patient fallen in last 6 months? Yes. Number of falls 2  LIVING ENVIRONMENT: Lives with: lives with their spouse Lives in: House/apartment Stairs: Yes: Internal: 2nd leve, but bedroom on first floor steps; has elevator and External: 2 steps; none Has following equipment at home: Single point cane, but does not use  PLOF: Independent  PATIENT GOALS To feel better and get my balance back  OBJECTIVE:     TODAY'S TREATMENT: 04/13/2022 Activity Comments  Reviewed HEP additions from last visit Head turns side to side Head nods up and down Dizziness increases to 5/10 with horizontal motion; also reports 5/10 with vertical motion  90 degree turns R and L, with initial cues to turn head to target, then turn body  Increased  unsteadiness with turns; decreased foot clearance  Side step and weightshift x 10 reps>progress to quarter turns, 90 degrees R and L, 3 sets x 5 reps UE support, increased unsteadiness/dizziness with turning head to target with turns  Standing feet together, feet partial tandem, 30 seconds EO and 30 sec EC Intermittent UE support with [facility]           HOME EXERCISE PROGRAM Last updated: 04/10/22 Access Code: [email or web address] URL: [email or web address] Date: 04/10/2022 Prepared by: [facility]  Program Notes perform exercises in a corner for safety!  Exercises - Corner Balance Feet Together With Eyes Closed  - 1 x daily - 7 x weekly - 1-3 sets - -30 sec hold - Staggered Stance Forward Backward Weight Shift with Counter Support  - 1 x daily - 5 x weekly - 1-2 sets - 10 reps - Alternating Step Forward with Support  - 1 x daily - 7 x weekly - 1-2 sets - 10 reps - Alternating Step Backward with Support  - 1 x daily - 7 x weekly - 1-2 sets - 10 reps - Side Stepping with Counter Support  - 1 x daily - 7 x weekly - 1-2 sets - 10 reps - Single Leg Stance with Support  - 1 x daily - 5 x weekly - 1 sets - 3 reps - 10 sec hold - Forward Step Up  - 1 x daily - 5 x weekly - 10 sets - 10 reps - Seated Left Head Turns Vestibular Habituation  - 1 x daily - 5 x weekly - 2 sets - 30 sec hold - Seated Head Nod  - 1 x daily - 5 x weekly - 2 sets - 30 sec hold    ------------------------------------------------------------------  Below measures were taken at time of initial evaluation unless otherwise specified:  DIAGNOSTIC FINDINGS:  Per CT 12/2021:  Disc levels:  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 No acute intracranial abnormality. 2. No evidence of acute cervical spine fracture or traumatic malalignment.  COGNITION: Overall cognitive status: Within functional limits for tasks assessed   Cervical ROM:  WFL  Active  A/PROM (deg) eval  Flexion   Extension   Right lateral flexion   Left lateral flexion   Right rotation   Left rotation   (Blank rows = not tested)  STRENGTH: Decreased functional strength, per 5x sit<>stand  TRANSFERS: Assistive device utilized: None  Sit to stand: Modified independence Stand to sit: Modified independence  GAIT: Gait pattern: step through pattern and wide BOS Distance walked: 60 ft Assistive device utilized: None Level of assistance: SBA Comments: With turn around furniture, mild LOB towards R side, able to self-recover  FUNCTIONAL TESTs:  5 times sit to stand: 22.22 Dynamic Gait Index:  11/24  M-CTSIB  Condition 1: Firm Surface, EO 30 Sec, Normal Sway  Condition 2: Firm Surface, EC 30 Sec, Mild Sway  Condition 3: Foam Surface, EO 30 Sec, Moderate Sway  Condition 4: Foam Surface, EC 9.31 Sec, Severe Sway    PATIENT SURVEYS:  FOTO Intake score = 50; predicted score = 58   VESTIBULAR ASSESSMENT   GENERAL OBSERVATION: Unsteadiness, cautious with gait    SYMPTOM BEHAVIOR:   Subjective history: Reports that her balance has been off for several years.  Pretty immediate symptoms of imbalance, need to grab for support   Non-Vestibular symptoms:  falls   Type of dizziness: Diplopia, Imbalance (Disequilibrium), Spinning/Vertigo, and Unsteady with head/body turns   Frequency: several times per week    Duration: approx 5 minutes   Aggravating factors: Induced by motion: occur when walking, bending down to the ground, turning body quickly, and turning head quickly   Relieving factors:  sit down   Progression of symptoms: worse   OCULOMOTOR EXAM:   Ocular Alignment: normal   Ocular ROM:  slightly limited R and L; moves head with eye motion initially   Spontaneous Nystagmus: absent   Gaze-Induced Nystagmus: absent   Smooth Pursuits: intact   Saccades: intact   Convergence/Divergence: decreased convergence noted     VESTIBULAR - OCULAR REFLEX:    Slow  VOR: Normal   VOR Cancellation: Comment: limited head turns to keep eyes focused on target   Head-Impulse Test: HIT Right: negative HIT Left: positive, no c/o   Dynamic Visual Acuity:  Attempted, but not able to be assessed, due to pt not having her glasses on      MOTION SENSITIVITY:    Motion Sensitivity Quotient  Intensity: 0 = none, 1 = Lightheaded, 2 = Mild, 3 = Moderate, 4 = Severe, 5 = Vomiting  Intensity  1. Sitting to supine   2. Supine to L side   3. Supine to R side   4. Supine to sitting   5. L Hallpike-Dix   6. Up from L    7. R Hallpike-Dix   8. Up from R    9. Sitting, head  tipped to L knee   10. Head up from L  knee   11. Sitting, head  tipped to R knee   12. Head up from R  knee   13. Sitting head turns x5   14.Sitting head nods x5   15. In stance, 180  turn to L    16. In stance, 180  turn to R      PATIENT EDUCATION: Education details: Eval results, POC Person educated: Patient Education method: Explanation Education comprehension: verbalized understanding   GOALS: Goals reviewed with patient? Yes  SHORT TERM GOALS: = LTGs   LONG TERM GOALS: Target date: 05/08/2022  Pt will be independent with HEP for improved balance, transfers, gait and overall c/o decreased dizziness Baseline:  Goal status: IN PROGRESS 04/10/22  2.  Pt will improve 5x sit<>stand to less than or equal to 15 sec to demonstrate improved functional strength and transfer efficiency. Baseline: 22.22 sec>19. Seconds 04/08/2022, 25.13 sec 04/10/22 Goal status: ONGOING 04/10/22  3.  Pt will Condition 4 on MCTSIB, for 30 seconds with mild-moderate sway, to demonstrate improved vestibular system use for balance. Baseline: 9.31 sec prior to opening eyes. 5.28 sec, 7.25 sec 04/08/2022, 4 sec 04/10/22 Goal status: ONGOING 04/10/22  4. Pt will improve DGI score by at least 4 points to decrease fall risk. Baseline:  11/24 03/12/22, 15/24 04/10/22 Goal status: IN PROGRESS  04/10/22  5.  Pt will improve FOTO score to 58 for improved overall functional mobility and decreased unsteadiness. Baseline: 45 04/10/22 Goal status: IN PROGRESS 04/10/22  ASSESSMENT:  CLINICAL IMPRESSION: Focused on turns in session today, with pt having increased unsteadiness with head turn to target precipitating turn.  She does report dizziness more as unsteadiness, and this is resolved somewhat with UE support with stationary object and with cane with work on turns.  With exercises with vision removed, and with narrowed BOS, pt continues to have increased unsteadiness and needs intermittent UE support for stability.   Patient is demonstrating slow but steady progress towards goals and would continue to benefit from additional skilled PT services to address remaining goals for balance and decreased fall risk.  OBJECTIVE IMPAIRMENTS Abnormal gait, decreased balance, decreased knowledge of use of DME, decreased mobility, difficulty walking, decreased strength, and dizziness.   ACTIVITY LIMITATIONS transfers and locomotion level  PARTICIPATION LIMITATIONS: shopping and community activity  PERSONAL FACTORS 3+ comorbidities: See above  are also affecting patient's functional outcome.   REHAB POTENTIAL: Good  CLINICAL DECISION MAKING: Evolving/moderate complexity  EVALUATION COMPLEXITY: Moderate   PLAN: PT FREQUENCY: 2x/week  PT DURATION: other: 4 weeks  PLANNED INTERVENTIONS: Therapeutic exercises, Therapeutic activity, Neuromuscular re-education, Balance training, Gait training, Patient/Family education, Self Care, Joint mobilization, Vestibular training, Canalith repositioning, and DME instructions  PLAN FOR NEXT SESSION:  Continue work on gaze stabilization activities and turns as this does bring on dizziness and patient with + B HIT on 04/10/22; Plan to add back in eyes closed exercises as appropriate.  Continue to work on gait training with cane on outdoor/unlevel surfaces,  especially curb steps.      Mady Haagensen, PT 04/13/22 11:03 AM Phone: [phone] Fax: [phone]   [facility] at [facility] [place], [place], [place] [address] Phone # [phone] Fax # [phone]

## 2022-04-15 ENCOUNTER — Ambulatory Visit: Payer: Medicare Other | Admitting: Physical Therapy

## 2022-04-15 ENCOUNTER — Encounter: Payer: Self-pay | Admitting: Physical Therapy

## 2022-04-15 DIAGNOSIS — R2681 Unsteadiness on feet: Secondary | ICD-10-CM

## 2022-04-15 DIAGNOSIS — R42 Dizziness and giddiness: Secondary | ICD-10-CM | POA: Diagnosis not present

## 2022-04-15 DIAGNOSIS — R2689 Other abnormalities of gait and mobility: Secondary | ICD-10-CM | POA: Diagnosis not present

## 2022-04-15 NOTE — Therapy (Signed)
OUTPATIENT PHYSICAL THERAPY VESTIBULAR/NEURO TREATMENT NOTE     Patient Name: Stacey [name] MRN: [id number] DOB:09/25/1940, 81 y.o., female Today's Date: 04/15/2022  PCP: Shon Baton, MD REFERRING PROVIDER: Raylene Miyamoto, MD       PT End of Session - 04/15/22 1721     Visit Number 11    Number of Visits 17    Date for PT Re-Evaluation 05/08/22    Authorization Type Medicare/BCBS    Progress Note Due on Visit 44    PT Start Time 1449    PT Stop Time 1531    PT Time Calculation (min) 42 min    Equipment Utilized During Treatment --    Activity Tolerance Patient tolerated treatment well    Behavior During Therapy WFL for tasks assessed/performed                     Past Medical History:  Diagnosis Date   Abnormal finding of blood chemistry    Asthma    H/O measles    H/O varicella    Hypertension    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[place])    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   Lichen sclerosus 21/30/86   Asymptomatic   Low iron    Mitral valve prolapse    Osteoarthritis    Osteoporosis    Pneumonia    Post herpetic neuralgia    Rheumatoid arthritis(714.0)    Yeast infection    Past Surgical History:  Procedure Laterality Date   CHOLECYSTECTOMY  2011   IR ANGIO INTRA EXTRACRAN SEL COM CAROTID INNOMINATE BILAT MOD SED  06/18/2020   IR ANGIO VERTEBRAL SEL SUBCLAVIAN INNOMINATE UNI R MOD SED  06/18/2020   IR ANGIO VERTEBRAL SEL VERTEBRAL UNI L MOD SED  06/18/2020   WISDOM TOOTH EXTRACTION     Patient Active Problem List   Diagnosis Date Noted   Chronic cough 06/12/2020   Constipation    CAP (community acquired pneumonia) 02/09/2020   Acute on chronic respiratory failure with hypoxia ([place]) 07/06/2019   Fall at home, initial encounter 07/06/2019   Orthostatic hypotension 07/06/2019   Rib fractures 07/04/2019   Pneumonia due to COVID-19 virus 06/11/2019   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[place]) 05/07/2019   Acute asthma exacerbation 12/18/2018   Elevated troponin  12/18/2018   Acute bronchitis 08/30/2018   Chronic respiratory failure with hypoxia ([place]) 04/06/2018   Essential hypertension 03/24/2018   Depression 03/24/2018   Asthma exacerbation 03/23/2018   DOE (dyspnea on exertion) 03/21/2018   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 esophagitis 03/21/2018   Chronic lung disease    Hypoxia    Dyspnea 01/23/2018   ILD (interstitial lung disease) ([place]) 01/23/2018   Claw toe, acquired, 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   Restrictive lung disease 07/18/2013   Multiple pulmonary nodules/RA lung dz  05/29/2013   Cough variant asthma vs uacs  05/29/2013   Rheumatoid arthritis ([place]) 01/13/2012   Osteopenia 57/84/6962   Lichen sclerosus et atrophicus of the vulva 01/13/2012   Asthma 01/13/2012    ONSET DATE: 03/04/2022 (MD referral)  REFERRING DIAG: R42 (ICD-10-CM) - Dizziness  THERAPY DIAG:  Unsteadiness on feet  Other abnormalities of gait and mobility  Rationale for Evaluation and Treatment Rehabilitation  SUBJECTIVE:   SUBJECTIVE STATEMENT: The cane is helping I think.  Today seems like a good day.  Saw my doctor at [facility] and they mentioned the balance clinic there.  Pt accompanied by: self  PERTINENT HISTORY: See above for PMH  PAIN:  Are you having pain? No  PRECAUTIONS: None  FALLS: Has patient fallen in last 6 months? Yes. Number of falls 2  LIVING ENVIRONMENT: Lives with: lives with their spouse Lives in: House/apartment Stairs: Yes: Internal: 2nd level, but bedroom on first floor steps; has elevator and External: 2 steps; none Has following equipment at home: Single point cane, but does not use  PLOF: Independent  PATIENT GOALS To feel better and get my balance back  OBJECTIVE:   Seated:  symptom rated before exercises:  3/10 (describes as focusing and loss of balance when things go double) TODAY'S TREATMENT: 04/15/2022 Activity Comments  Seated head turns to targets x 30 sec; seated head nods to targets 30 sec No double vision  Repeated the above exercises  in standing, holding to chair, 2 sets with lessening UE support Rates symptoms as 2/10  Wide BOS lateral weigthshifting x 10 reps Cues for support at counter  Quarter turns R and L, at counter, 5 reps each direction>progressed to 180 degree turns Cues for support at counter, cues for wider BOS; no c/o increased unsteadiness with UE support         PATIENT EDUCATION: Education details: Answered patient's questions about our balance sessions versus balance clinic at [facility] (question whether services may be duplicated); reiterated again importance of consistent HEP performance and rationale for improved balance/vestibular system function; reiterated wider BOS for stability in standing and use of cane for "good days"/rollator for "not so good days" as she NEEDS UE support for stability. *for HEP-try head turns/nods in standing* Person educated: Patient Education method: Explanation and Demonstration Education comprehension: verbalized understanding and needs further education      HOME EXERCISE PROGRAM Last updated: 04/10/22 Access Code: [email or web address] URL: [email or web address] Date: 04/10/2022 Prepared by: [facility]  Program Notes perform exercises in a corner for safety!  Exercises - Corner Balance Feet Together With Eyes Closed  - 1 x daily - 7 x weekly - 1-3 sets - -30 sec hold - Staggered Stance Forward Backward Weight Shift with Counter Support  - 1 x daily - 5 x weekly - 1-2 sets - 10 reps - Alternating Step Forward with Support  - 1 x daily - 7 x weekly - 1-2 sets - 10 reps - Alternating Step Backward with Support  - 1 x daily - 7 x weekly - 1-2 sets - 10 reps - Side Stepping with Counter Support  - 1 x daily - 7 x weekly - 1-2 sets - 10 reps - Single Leg Stance with Support  - 1 x daily - 5 x weekly - 1 sets - 3 reps - 10 sec hold - Forward Step Up  - 1 x daily - 5 x weekly - 10 sets - 10 reps - Seated Left Head Turns Vestibular  Habituation  - 1 x daily - 5 x weekly - 2 sets - 30 sec hold - Seated Head Nod  - 1 x daily - 5 x weekly - 2 sets - 30 sec hold    ------------------------------------------------------------------  Below measures were taken at time of initial evaluation unless otherwise specified:  DIAGNOSTIC FINDINGS:  Per CT 12/2021:  Disc levels:  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 No acute intracranial abnormality. 2. No evidence of acute cervical spine fracture or traumatic malalignment.  COGNITION: Overall cognitive status: Within functional limits for tasks assessed   Cervical ROM:  WFL  Active A/PROM (deg) eval  Flexion   Extension  Right lateral flexion   Left lateral flexion   Right rotation   Left rotation   (Blank rows = not tested)  STRENGTH: Decreased functional strength, per 5x sit<>stand  TRANSFERS: Assistive device utilized: None  Sit to stand: Modified independence Stand to sit: Modified independence  GAIT: Gait pattern: step through pattern and wide BOS Distance walked: 60 ft Assistive device utilized: None Level of assistance: SBA Comments: With turn around furniture, mild LOB towards R side, able to self-recover  FUNCTIONAL TESTs:  5 times sit to stand: 22.22 Dynamic Gait Index: 11/24  M-CTSIB  Condition 1: Firm Surface, EO 30 Sec, Normal Sway  Condition 2: Firm Surface, EC 30 Sec, Mild Sway  Condition 3: Foam Surface, EO 30 Sec, Moderate Sway  Condition 4: Foam Surface, EC 9.31 Sec, Severe Sway    PATIENT SURVEYS:  FOTO Intake score = 50; predicted score = 58   VESTIBULAR ASSESSMENT   GENERAL OBSERVATION: Unsteadiness, cautious with gait    SYMPTOM BEHAVIOR:   Subjective history: Reports that her balance has been off for several years.  Pretty immediate symptoms of imbalance, need to grab for support   Non-Vestibular symptoms:  falls   Type of dizziness: Diplopia, Imbalance (Disequilibrium), Spinning/Vertigo,  and Unsteady with head/body turns   Frequency: several times per week    Duration: approx 5 minutes   Aggravating factors: Induced by motion: occur when walking, bending down to the ground, turning body quickly, and turning head quickly   Relieving factors:  sit down   Progression of symptoms: worse   OCULOMOTOR EXAM:   Ocular Alignment: normal   Ocular ROM:  slightly limited R and L; moves head with eye motion initially   Spontaneous Nystagmus: absent   Gaze-Induced Nystagmus: absent   Smooth Pursuits: intact   Saccades: intact   Convergence/Divergence: decreased convergence noted     VESTIBULAR - OCULAR REFLEX:    Slow VOR: Normal   VOR Cancellation: Comment: limited head turns to keep eyes focused on target   Head-Impulse Test: HIT Right: negative HIT Left: positive, no c/o   Dynamic Visual Acuity:  Attempted, but not able to be assessed, due to pt not having her glasses on      MOTION SENSITIVITY:    Motion Sensitivity Quotient  Intensity: 0 = none, 1 = Lightheaded, 2 = Mild, 3 = Moderate, 4 = Severe, 5 = Vomiting  Intensity  1. Sitting to supine   2. Supine to L side   3. Supine to R side   4. Supine to sitting   5. L Hallpike-Dix   6. Up from L    7. R Hallpike-Dix   8. Up from R    9. Sitting, head  tipped to L knee   10. Head up from L  knee   11. Sitting, head  tipped to R knee   12. Head up from R  knee   13. Sitting head turns x5   14.Sitting head nods x5   15. In stance, 180  turn to L    16. In stance, 180  turn to R      PATIENT EDUCATION: Education details: Eval results, POC Person educated: Patient Education method: Explanation Education comprehension: verbalized understanding   GOALS: Goals reviewed with patient? Yes  SHORT TERM GOALS: = LTGs   LONG TERM GOALS: Target date: 05/08/2022  Pt will be independent with HEP for improved balance, transfers, gait and overall c/o decreased dizziness Baseline:  Goal status: IN PROGRESS  04/10/22  2.  Pt will improve 5x sit<>stand to less than or equal to 15 sec to demonstrate improved functional strength and transfer efficiency. Baseline: 22.22 sec>19. Seconds 04/08/2022, 25.13 sec 04/10/22 Goal status: ONGOING 04/10/22  3.  Pt will Condition 4 on MCTSIB, for 30 seconds with mild-moderate sway, to demonstrate improved vestibular system use for balance. Baseline: 9.31 sec prior to opening eyes. 5.28 sec, 7.25 sec 04/08/2022, 4 sec 04/10/22 Goal status: ONGOING 04/10/22  4. Pt will improve DGI score by at least 4 points to decrease fall risk. Baseline: 11/24 03/12/22, 15/24 04/10/22 Goal status: IN PROGRESS 04/10/22  5.  Pt will improve FOTO score to 58 for improved overall functional mobility and decreased unsteadiness. Baseline: 45 04/10/22 Goal status: IN PROGRESS 04/10/22  ASSESSMENT:  CLINICAL IMPRESSION: Pt presents to OPPT today reporting it "being a good day, balance wise."  She continues to report "not doing exercises as much as I should." Pt does not report any double vision today, and pt is more steady with standing exercises.  Also, PT cues pt throughout session to have UE support on counter/chair/cane while standing, and she reports decreased unsteadiness throughout session today.  With turns today, pt is more steady than last session, using UE support; able to progress to work on 180 degree turns.  She will continue to benefit from skilled PT towards goals for improved overall functional mobility and decreased fall risk.  OBJECTIVE IMPAIRMENTS Abnormal gait, decreased balance, decreased knowledge of use of DME, decreased mobility, difficulty walking, decreased strength, and dizziness.   ACTIVITY LIMITATIONS transfers and locomotion level  PARTICIPATION LIMITATIONS: shopping and community activity  PERSONAL FACTORS 3+ comorbidities: See above  are also affecting patient's functional outcome.   REHAB POTENTIAL: Good  CLINICAL DECISION MAKING:  Evolving/moderate complexity  EVALUATION COMPLEXITY: Moderate   PLAN: PT FREQUENCY: 2x/week  PT DURATION: other: 4 weeks  PLANNED INTERVENTIONS: Therapeutic exercises, Therapeutic activity, Neuromuscular re-education, Balance training, Gait training, Patient/Family education, Self Care, Joint mobilization, Vestibular training, Canalith repositioning, and DME instructions  PLAN FOR NEXT SESSION:  Continue work on gaze stabilization activities and turns as this does bring on dizziness and patient with + B HIT on 04/10/22; Plan to add back in eyes closed exercises as appropriate.  Continue to work on gait training with cane on outdoor/unlevel surfaces, especially curb steps.      Mady Haagensen, PT 04/15/22 5:22 PM Phone: [phone] Fax: [phone]   [facility] at [facility] [place], [place],  [address] Phone # [phone] Fax # [phone]

## 2022-04-24 ENCOUNTER — Ambulatory Visit: Payer: Medicare Other | Admitting: Physical Therapy

## 2022-04-27 ENCOUNTER — Encounter: Payer: Self-pay | Admitting: Physical Therapy

## 2022-04-27 ENCOUNTER — Ambulatory Visit: Payer: Medicare Other | Admitting: Physical Therapy

## 2022-04-27 DIAGNOSIS — R2681 Unsteadiness on feet: Secondary | ICD-10-CM | POA: Diagnosis not present

## 2022-04-27 DIAGNOSIS — R42 Dizziness and giddiness: Secondary | ICD-10-CM

## 2022-04-27 DIAGNOSIS — H532 Diplopia: Secondary | ICD-10-CM | POA: Diagnosis not present

## 2022-04-27 DIAGNOSIS — Z961 Presence of intraocular lens: Secondary | ICD-10-CM | POA: Diagnosis not present

## 2022-04-27 DIAGNOSIS — R2689 Other abnormalities of gait and mobility: Secondary | ICD-10-CM | POA: Diagnosis not present

## 2022-04-27 NOTE — Therapy (Signed)
OUTPATIENT PHYSICAL THERAPY VESTIBULAR/NEURO TREATMENT NOTE     Patient Name: Stacey [name] MRN: [id number] DOB:1940-08-24, 81 y.o., female Today's Date: 04/27/2022  PCP: Shon Baton, MD REFERRING PROVIDER: Raylene Miyamoto, MD       PT End of Session - 04/27/22 0936     Visit Number 12    Number of Visits 17    Date for PT Re-Evaluation 05/08/22    Authorization Type Medicare/BCBS    Progress Note Due on Visit 74    PT Start Time 0936   late arrival   PT Stop Time 1014    PT Time Calculation (min) 38 min    Equipment Utilized During Treatment Gait belt    Activity Tolerance Patient tolerated treatment well    Behavior During Therapy WFL for tasks assessed/performed                      Past Medical History:  Diagnosis Date   Abnormal finding of blood chemistry    Asthma    H/O measles    H/O varicella    Hypertension    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[place])    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   Lichen sclerosus 33/82/50   Asymptomatic   Low iron    Mitral valve prolapse    Osteoarthritis    Osteoporosis    Pneumonia    Post herpetic neuralgia    Rheumatoid arthritis(714.0)    Yeast infection    Past Surgical History:  Procedure Laterality Date   CHOLECYSTECTOMY  2011   IR ANGIO INTRA EXTRACRAN SEL COM CAROTID INNOMINATE BILAT MOD SED  06/18/2020   IR ANGIO VERTEBRAL SEL SUBCLAVIAN INNOMINATE UNI R MOD SED  06/18/2020   IR ANGIO VERTEBRAL SEL VERTEBRAL UNI L MOD SED  06/18/2020   WISDOM TOOTH EXTRACTION     Patient Active Problem List   Diagnosis Date Noted   Chronic cough 06/12/2020   Constipation    CAP (community acquired pneumonia) 02/09/2020   Acute on chronic respiratory failure with hypoxia ([place]) 07/06/2019   Fall at home, initial encounter 07/06/2019   Orthostatic hypotension 07/06/2019   Rib fractures 07/04/2019   Pneumonia due to COVID-19 virus 06/11/2019   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[place]) 05/07/2019   Acute asthma exacerbation  12/18/2018   Elevated troponin 12/18/2018   Acute bronchitis 08/30/2018   Chronic respiratory failure with hypoxia ([place]) 04/06/2018   Essential hypertension 03/24/2018   Depression 03/24/2018   Asthma exacerbation 03/23/2018   DOE (dyspnea on exertion) 03/21/2018   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 esophagitis 03/21/2018   Chronic lung disease    Hypoxia    Dyspnea 01/23/2018   ILD (interstitial lung disease) ([place]) 01/23/2018   Claw toe, acquired, 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   Restrictive lung disease 07/18/2013   Multiple pulmonary nodules/RA lung dz  05/29/2013   Cough variant asthma vs uacs  05/29/2013   Rheumatoid arthritis ([place]) 01/13/2012   Osteopenia 53/97/6734   Lichen sclerosus et atrophicus of the vulva 01/13/2012   Asthma 01/13/2012    ONSET DATE: 03/04/2022 (MD referral)  REFERRING DIAG: R42 (ICD-10-CM) - Dizziness  THERAPY DIAG:  Unsteadiness on feet  Other abnormalities of gait and mobility  Dizziness and giddiness  Rationale for Evaluation and Treatment Rehabilitation  SUBJECTIVE:   SUBJECTIVE STATEMENT: No falls on the trip.  Had a great trip.  Borrowed a rollator for long distances shopping with my friends.  Still have good days and bad days. Pt accompanied by: self  PERTINENT HISTORY: See above  for PMH  PAIN:  Are you having pain? No  PRECAUTIONS: None  FALLS: Has patient fallen in last 6 months? Yes. Number of falls 2  LIVING ENVIRONMENT: Lives with: lives with their spouse Lives in: House/apartment Stairs: Yes: Internal: 2nd level, but bedroom on first floor steps; has elevator and External: 2 steps; none Has following equipment at home: Single point cane, but does not use  PLOF: Independent  PATIENT GOALS To feel better and get my balance back  OBJECTIVE:    TODAY'S TREATMENT: 04/27/2022 Activity Comments  Sit<>stand 2 x 5 reps Cues for wider BOS upon standing  Forward step ups, step up/up, down/down x 10 reps each leg, then single limb step ups x 10; 2 sets-  on 4" step, then on 6" step 1 UE support; pt reports feeling LLE weaker     Gait with SPC/circular base 50 ft x 4 reps with turns Supervision, cues for cane sequence and pacing  Stair negotiation, 4 reps with 1 handrail, 4" and 6" steps Step-to and step-through pattern; discussed that step-to pattern would be safer  Gait training with pt's SPC with circular rubber base, 40 ft x 6 reps Pt with inconsistent cane sequence; verbal cues and demo cues for technique       PATIENT EDUCATION: Education details: Stair negotiation:  go up with the good, go down with the bad (Step through pattern is okay ascending; step-to pattern is optimal descending) Person educated: Patient Education method: Explanation and Demonstration Education comprehension: verbalized understanding, returned demonstration, and needs further education      HOME EXERCISE PROGRAM Last updated: 04/10/22 Access Code: [email or web address] URL: [email or web address] Date: 04/10/2022 Prepared by: [facility]  Program Notes perform exercises in a corner for safety!  Exercises - Corner Balance Feet Together With Eyes Closed  - 1 x daily - 7 x weekly - 1-3 sets - -30 sec hold - Staggered Stance Forward Backward Weight Shift with Counter Support  - 1 x daily - 5 x weekly - 1-2 sets - 10 reps - Alternating Step Forward with Support  - 1 x daily - 7 x weekly - 1-2 sets - 10 reps - Alternating Step Backward with Support  - 1 x daily - 7 x weekly - 1-2 sets - 10 reps - Side Stepping with Counter Support  - 1 x daily - 7 x weekly - 1-2 sets - 10 reps - Single Leg Stance with Support  - 1 x daily - 5 x weekly - 1 sets - 3 reps - 10 sec hold - Forward Step Up  - 1 x daily - 5 x weekly - 10 sets - 10 reps - Seated Left Head Turns Vestibular Habituation  - 1 x daily - 5 x weekly - 2 sets - 30 sec hold - Seated Head Nod  - 1 x daily - 5 x weekly - 2 sets - 30 sec  hold    ------------------------------------------------------------------  Below measures were taken at time of initial evaluation unless otherwise specified:  DIAGNOSTIC FINDINGS:  Per CT 12/2021:  Disc levels:  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 No acute intracranial abnormality. 2. No evidence of acute cervical spine fracture or traumatic malalignment.  COGNITION: Overall cognitive status: Within functional limits for tasks assessed   Cervical ROM:  WFL  Active A/PROM (deg) eval  Flexion   Extension   Right lateral flexion   Left lateral flexion  Right rotation   Left rotation   (Blank rows = not tested)  STRENGTH: Decreased functional strength, per 5x sit<>stand  TRANSFERS: Assistive device utilized: None  Sit to stand: Modified independence Stand to sit: Modified independence  GAIT: Gait pattern: step through pattern and wide BOS Distance walked: 60 ft Assistive device utilized: None Level of assistance: SBA Comments: With turn around furniture, mild LOB towards R side, able to self-recover  FUNCTIONAL TESTs:  5 times sit to stand: 22.22 Dynamic Gait Index: 11/24  M-CTSIB  Condition 1: Firm Surface, EO 30 Sec, Normal Sway  Condition 2: Firm Surface, EC 30 Sec, Mild Sway  Condition 3: Foam Surface, EO 30 Sec, Moderate Sway  Condition 4: Foam Surface, EC 9.31 Sec, Severe Sway    PATIENT SURVEYS:  FOTO Intake score = 50; predicted score = 58   VESTIBULAR ASSESSMENT   GENERAL OBSERVATION: Unsteadiness, cautious with gait    SYMPTOM BEHAVIOR:   Subjective history: Reports that her balance has been off for several years.  Pretty immediate symptoms of imbalance, need to grab for support   Non-Vestibular symptoms:  falls   Type of dizziness: Diplopia, Imbalance (Disequilibrium), Spinning/Vertigo, and Unsteady with head/body turns   Frequency: several times per week    Duration: approx 5 minutes   Aggravating  factors: Induced by motion: occur when walking, bending down to the ground, turning body quickly, and turning head quickly   Relieving factors:  sit down   Progression of symptoms: worse   OCULOMOTOR EXAM:   Ocular Alignment: normal   Ocular ROM:  slightly limited R and L; moves head with eye motion initially   Spontaneous Nystagmus: absent   Gaze-Induced Nystagmus: absent   Smooth Pursuits: intact   Saccades: intact   Convergence/Divergence: decreased convergence noted     VESTIBULAR - OCULAR REFLEX:    Slow VOR: Normal   VOR Cancellation: Comment: limited head turns to keep eyes focused on target   Head-Impulse Test: HIT Right: negative HIT Left: positive, no c/o   Dynamic Visual Acuity:  Attempted, but not able to be assessed, due to pt not having her glasses on      MOTION SENSITIVITY:    Motion Sensitivity Quotient  Intensity: 0 = none, 1 = Lightheaded, 2 = Mild, 3 = Moderate, 4 = Severe, 5 = Vomiting  Intensity  1. Sitting to supine   2. Supine to L side   3. Supine to R side   4. Supine to sitting   5. L Hallpike-Dix   6. Up from L    7. R Hallpike-Dix   8. Up from R    9. Sitting, head  tipped to L knee   10. Head up from L  knee   11. Sitting, head  tipped to R knee   12. Head up from R  knee   13. Sitting head turns x5   14.Sitting head nods x5   15. In stance, 180  turn to L    16. In stance, 180  turn to R      PATIENT EDUCATION: Education details: Eval results, POC Person educated: Patient Education method: Explanation Education comprehension: verbalized understanding   GOALS: Goals reviewed with patient? Yes  SHORT TERM GOALS: = LTGs   LONG TERM GOALS: Target date: 05/08/2022  Pt will be independent with HEP for improved balance, transfers, gait and overall c/o decreased dizziness Baseline:  Goal status: IN PROGRESS 04/10/22  2.  Pt will improve 5x sit<>stand to  less than or equal to 15 sec to demonstrate improved functional  strength and transfer efficiency. Baseline: 22.22 sec>19. Seconds 04/08/2022, 25.13 sec 04/10/22 Goal status: ONGOING 04/10/22  3.  Pt will Condition 4 on MCTSIB, for 30 seconds with mild-moderate sway, to demonstrate improved vestibular system use for balance. Baseline: 9.31 sec prior to opening eyes. 5.28 sec, 7.25 sec 04/08/2022, 4 sec 04/10/22 Goal status: ONGOING 04/10/22  4. Pt will improve DGI score by at least 4 points to decrease fall risk. Baseline: 11/24 03/12/22, 15/24 04/10/22 Goal status: IN PROGRESS 04/10/22  5.  Pt will improve FOTO score to 58 for improved overall functional mobility and decreased unsteadiness. Baseline: 45 04/10/22 Goal status: IN PROGRESS 04/10/22  ASSESSMENT:  CLINICAL IMPRESSION: Pt presents to OPPT today after being out of town x 1 week.  Skilled PT session today focused on functional strength towards goals and pt's report of difficulty with stairs.  She does not report dizziness today until end of session with work on sequencing gait with cane.  With cane use, she continues to demonstrate LLE lateral instability when off-sequence with cane.  She is able to demo improved sequence with slowed gait pace.  Also, verbal, visual cues and repetition for stair negotiation pattern, with education on step-through pattern descending stairs for optimal safety.  She demo good carryover about 50% of the time and would benefit from more practice.  OBJECTIVE IMPAIRMENTS Abnormal gait, decreased balance, decreased knowledge of use of DME, decreased mobility, difficulty walking, decreased strength, and dizziness.   ACTIVITY LIMITATIONS transfers and locomotion level  PARTICIPATION LIMITATIONS: shopping and community activity  PERSONAL FACTORS 3+ comorbidities: See above  are also affecting patient's functional outcome.   REHAB POTENTIAL: Good  CLINICAL DECISION MAKING: Evolving/moderate complexity  EVALUATION COMPLEXITY: Moderate   PLAN: PT FREQUENCY:  2x/week  PT DURATION: other: 4 weeks  PLANNED INTERVENTIONS: Therapeutic exercises, Therapeutic activity, Neuromuscular re-education, Balance training, Gait training, Patient/Family education, Self Care, Joint mobilization, Vestibular training, Canalith repositioning, and DME instructions  PLAN FOR NEXT SESSION:  LTGs due next week (discuss POC).  Reassess stair/gait training and continue work on gaze stabilization activities and turns as this does bring on dizziness and patient with + B HIT on 04/10/22; Plan to add back in eyes closed exercises as appropriate.  Continue to work on gait training with cane on outdoor/unlevel surfaces, especially curb steps.      Mady Haagensen, PT 04/27/22 10:18 AM Phone: [phone] Fax: [phone]   [facility] at [facility] [place], [place], [place] [address] Phone # [phone] Fax # [phone]

## 2022-04-29 NOTE — Therapy (Signed)
OUTPATIENT PHYSICAL THERAPY VESTIBULAR/NEURO TREATMENT NOTE     Patient Name: Stacey [name] MRN: [id number] DOB:May 12, 1941, 81 y.o., female Today's Date: 04/30/2022  PCP: Shon Baton, MD REFERRING PROVIDER: Raylene Miyamoto, MD       PT End of Session - 04/30/22 1530     Visit Number 13    Number of Visits 17    Date for PT Re-Evaluation 05/08/22    Authorization Type Medicare/BCBS    Progress Note Due on Visit 51    PT Start Time 1448    PT Stop Time 1529    PT Time Calculation (min) 41 min    Equipment Utilized During Treatment Gait belt    Activity Tolerance Patient tolerated treatment well    Behavior During Therapy WFL for tasks assessed/performed                       Past Medical History:  Diagnosis Date   Abnormal finding of blood chemistry    Asthma    H/O measles    H/O varicella    Hypertension    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[place])    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   Lichen sclerosus 83/41/96   Asymptomatic   Low iron    Mitral valve prolapse    Osteoarthritis    Osteoporosis    Pneumonia    Post herpetic neuralgia    Rheumatoid arthritis(714.0)    Yeast infection    Past Surgical History:  Procedure Laterality Date   CHOLECYSTECTOMY  2011   IR ANGIO INTRA EXTRACRAN SEL COM CAROTID INNOMINATE BILAT MOD SED  06/18/2020   IR ANGIO VERTEBRAL SEL SUBCLAVIAN INNOMINATE UNI R MOD SED  06/18/2020   IR ANGIO VERTEBRAL SEL VERTEBRAL UNI L MOD SED  06/18/2020   WISDOM TOOTH EXTRACTION     Patient Active Problem List   Diagnosis Date Noted   Chronic cough 06/12/2020   Constipation    CAP (community acquired pneumonia) 02/09/2020   Acute on chronic respiratory failure with hypoxia ([place]) 07/06/2019   Fall at home, initial encounter 07/06/2019   Orthostatic hypotension 07/06/2019   Rib fractures 07/04/2019   Pneumonia due to COVID-19 virus 06/11/2019   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[place]) 05/07/2019   Acute asthma exacerbation 12/18/2018    Elevated troponin 12/18/2018   Acute bronchitis 08/30/2018   Chronic respiratory failure with hypoxia ([place]) 04/06/2018   Essential hypertension 03/24/2018   Depression 03/24/2018   Asthma exacerbation 03/23/2018   DOE (dyspnea on exertion) 03/21/2018   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 esophagitis 03/21/2018   Chronic lung disease    Hypoxia    Dyspnea 01/23/2018   ILD (interstitial lung disease) ([place]) 01/23/2018   Claw toe, acquired, 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   Restrictive lung disease 07/18/2013   Multiple pulmonary nodules/RA lung dz  05/29/2013   Cough variant asthma vs uacs  05/29/2013   Rheumatoid arthritis ([place]) 01/13/2012   Osteopenia 22/29/7989   Lichen sclerosus et atrophicus of the vulva 01/13/2012   Asthma 01/13/2012    ONSET DATE: 03/04/2022 (MD referral)  REFERRING DIAG: R42 (ICD-10-CM) - Dizziness  THERAPY DIAG:  Unsteadiness on feet  Other abnormalities of gait and mobility  Dizziness and giddiness  Rationale for Evaluation and Treatment Rehabilitation  SUBJECTIVE:   SUBJECTIVE STATEMENT: The wider base on her cane helps tremendously. Have good days and bad days with dizziness. Not sure if certain things bring on dizziness.  Pt accompanied by: self  PERTINENT HISTORY: See above for PMH  PAIN:  Are  you having pain? Yes: NPRS scale: 5/10 Pain location: L LE Pain description: nerve pain Aggravating factors: walking Relieving factors: nothing  PRECAUTIONS: None  FALLS: Has patient fallen in last 6 months? Yes. Number of falls 2  LIVING ENVIRONMENT: Lives with: lives with their spouse Lives in: House/apartment Stairs: Yes: Internal: 2nd level, but bedroom on first floor steps; has elevator and External: 2 steps; none Has following equipment at home: Single point cane, but does not use  PLOF: Independent  PATIENT GOALS To feel better and get my balance back  OBJECTIVE:      TODAY'S TREATMENT: 04/30/22 Activity Comments  Stair navigation With cane, cueing for proper  sequencing; reminders and CGA required  Gait training with quad tip cane x250 ft on sidewalk, hills, grass L pulsion and path deviation; heavy correction of AD sequencing; 2 standing breaks d/t fatigue  1/2 turns to targets with cane 2x10; 2nd set at quicker pace  Cues to widen BOS to improve stability/tolerance which pt reported good benefit with  standing head turns to targets 2x30" With cane; no dizziness  standing head nods to targets 2x30" With cane; 7/10 dizziness; unable to complete last set   romberg EC 2x30" Mild sway       PATIENT EDUCATION: Education details: hangout with written sequencing on stair navigation; discussion on remainder of POC with plans for D/C on 25th as patient admits to continued HEP noncompliance  Person educated: Patient Education method: Explanation and Demonstration Education comprehension: verbalized understanding, returned demonstration, and needs further education      HOME EXERCISE PROGRAM Last updated: 04/10/22 Access Code: [email or web address] URL: [email or web address] Date: 04/10/2022 Prepared by: [facility]  Program Notes perform exercises in a corner for safety!  Exercises - Corner Balance Feet Together With Eyes Closed  - 1 x daily - 7 x weekly - 1-3 sets - -30 sec hold - Staggered Stance Forward Backward Weight Shift with Counter Support  - 1 x daily - 5 x weekly - 1-2 sets - 10 reps - Alternating Step Forward with Support  - 1 x daily - 7 x weekly - 1-2 sets - 10 reps - Alternating Step Backward with Support  - 1 x daily - 7 x weekly - 1-2 sets - 10 reps - Side Stepping with Counter Support  - 1 x daily - 7 x weekly - 1-2 sets - 10 reps - Single Leg Stance with Support  - 1 x daily - 5 x weekly - 1 sets - 3 reps - 10 sec hold - Forward Step Up  - 1 x daily - 5 x weekly - 10 sets - 10 reps - Seated Left Head Turns Vestibular Habituation  - 1 x daily - 5 x weekly - 2 sets - 30 sec hold -  Seated Head Nod  - 1 x daily - 5 x weekly - 2 sets - 30 sec hold    ------------------------------------------------------------------  Below measures were taken at time of initial evaluation unless otherwise specified:  DIAGNOSTIC FINDINGS:  Per CT 12/2021:  Disc levels:  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 No acute intracranial abnormality. 2. No evidence of acute cervical spine fracture or traumatic malalignment.  COGNITION: Overall cognitive status: Within functional limits for tasks assessed   Cervical ROM:  WFL  Active A/PROM (deg) eval  Flexion   Extension   Right lateral flexion   Left lateral flexion   Right rotation  Left rotation   (Blank rows = not tested)  STRENGTH: Decreased functional strength, per 5x sit<>stand  TRANSFERS: Assistive device utilized: None  Sit to stand: Modified independence Stand to sit: Modified independence  GAIT: Gait pattern: step through pattern and wide BOS Distance walked: 60 ft Assistive device utilized: None Level of assistance: SBA Comments: With turn around furniture, mild LOB towards R side, able to self-recover  FUNCTIONAL TESTs:  5 times sit to stand: 22.22 Dynamic Gait Index: 11/24  M-CTSIB  Condition 1: Firm Surface, EO 30 Sec, Normal Sway  Condition 2: Firm Surface, EC 30 Sec, Mild Sway  Condition 3: Foam Surface, EO 30 Sec, Moderate Sway  Condition 4: Foam Surface, EC 9.31 Sec, Severe Sway    PATIENT SURVEYS:  FOTO Intake score = 50; predicted score = 58   VESTIBULAR ASSESSMENT   GENERAL OBSERVATION: Unsteadiness, cautious with gait    SYMPTOM BEHAVIOR:   Subjective history: Reports that her balance has been off for several years.  Pretty immediate symptoms of imbalance, need to grab for support   Non-Vestibular symptoms:  falls   Type of dizziness: Diplopia, Imbalance (Disequilibrium), Spinning/Vertigo, and Unsteady with head/body turns   Frequency: several times  per week    Duration: approx 5 minutes   Aggravating factors: Induced by motion: occur when walking, bending down to the ground, turning body quickly, and turning head quickly   Relieving factors:  sit down   Progression of symptoms: worse   OCULOMOTOR EXAM:   Ocular Alignment: normal   Ocular ROM:  slightly limited R and L; moves head with eye motion initially   Spontaneous Nystagmus: absent   Gaze-Induced Nystagmus: absent   Smooth Pursuits: intact   Saccades: intact   Convergence/Divergence: decreased convergence noted     VESTIBULAR - OCULAR REFLEX:    Slow VOR: Normal   VOR Cancellation: Comment: limited head turns to keep eyes focused on target   Head-Impulse Test: HIT Right: negative HIT Left: positive, no c/o   Dynamic Visual Acuity:  Attempted, but not able to be assessed, due to pt not having her glasses on      MOTION SENSITIVITY:    Motion Sensitivity Quotient  Intensity: 0 = none, 1 = Lightheaded, 2 = Mild, 3 = Moderate, 4 = Severe, 5 = Vomiting  Intensity  1. Sitting to supine   2. Supine to L side   3. Supine to R side   4. Supine to sitting   5. L Hallpike-Dix   6. Up from L    7. R Hallpike-Dix   8. Up from R    9. Sitting, head  tipped to L knee   10. Head up from L  knee   11. Sitting, head  tipped to R knee   12. Head up from R  knee   13. Sitting head turns x5   14.Sitting head nods x5   15. In stance, 180  turn to L    16. In stance, 180  turn to R      PATIENT EDUCATION: Education details: Eval results, POC Person educated: Patient Education method: Explanation Education comprehension: verbalized understanding   GOALS: Goals reviewed with patient? Yes  SHORT TERM GOALS: = LTGs   LONG TERM GOALS: Target date: 05/08/2022  Pt will be independent with HEP for improved balance, transfers, gait and overall c/o decreased dizziness Baseline:  Goal status: IN PROGRESS 04/10/22  2.  Pt will improve 5x sit<>stand to less than or  equal to 15 sec to demonstrate improved functional strength and transfer efficiency. Baseline: 22.22 sec>19. Seconds 04/08/2022, 25.13 sec 04/10/22 Goal status: ONGOING 04/10/22  3.  Pt will Condition 4 on MCTSIB, for 30 seconds with mild-moderate sway, to demonstrate improved vestibular system use for balance. Baseline: 9.31 sec prior to opening eyes. 5.28 sec, 7.25 sec 04/08/2022, 4 sec 04/10/22 Goal status: ONGOING 04/10/22  4. Pt will improve DGI score by at least 4 points to decrease fall risk. Baseline: 11/24 03/12/22, 15/24 04/10/22 Goal status: IN PROGRESS 04/10/22  5.  Pt will improve FOTO score to 58 for improved overall functional mobility and decreased unsteadiness. Baseline: 45 04/10/22 Goal status: IN PROGRESS 04/10/22  ASSESSMENT:  CLINICAL IMPRESSION: Patient arrived to session with report of fluctuating symptoms. Patient admits to continued HEP noncompliance and agreeable to D/C on last scheduled appointment on 05/06/22. Reviewed stair navigation with cueing on proper AD sequencing as patient with poor carryover from last session. Gait training with cane revealed L pulsion and path deviation and loss of proper cane sequencing, requiring breaks to reset. Gaze stabilization in vertical head movements brought on severe dizziness but otherwise with better tolerance for head/body turns. Patient tolerated session fairly well despite symptoms. No complaints upon leaving.  OBJECTIVE IMPAIRMENTS Abnormal gait, decreased balance, decreased knowledge of use of DME, decreased mobility, difficulty walking, decreased strength, and dizziness.   ACTIVITY LIMITATIONS transfers and locomotion level  PARTICIPATION LIMITATIONS: shopping and community activity  PERSONAL FACTORS 3+ comorbidities: See above  are also affecting patient's functional outcome.   REHAB POTENTIAL: Good  CLINICAL DECISION MAKING: Evolving/moderate complexity  EVALUATION COMPLEXITY: Moderate   PLAN: PT  FREQUENCY: 2x/week  PT DURATION: other: 4 weeks  PLANNED INTERVENTIONS: Therapeutic exercises, Therapeutic activity, Neuromuscular re-education, Balance training, Gait training, Patient/Family education, Self Care, Joint mobilization, Vestibular training, Canalith repositioning, and DME instructions  PLAN FOR NEXT SESSION:  LTGs due next week (discuss POC).  Reassess stair/gait training and continue work on gaze stabilization activities and turns as this does bring on dizziness and patient with + B HIT on 04/10/22; Plan to add back in eyes closed exercises as appropriate.  Continue to work on gait training with cane on outdoor/unlevel surfaces, especially curb steps.      Janene Harvey, PT, DPT 04/30/22 3:33 PM  [facility] [address] [place], [place], [place] [address] Phone # [phone] Fax # [phone]

## 2022-04-30 ENCOUNTER — Ambulatory Visit: Payer: Medicare Other | Admitting: Physical Therapy

## 2022-04-30 ENCOUNTER — Encounter: Payer: Self-pay | Admitting: Physical Therapy

## 2022-04-30 DIAGNOSIS — R2689 Other abnormalities of gait and mobility: Secondary | ICD-10-CM

## 2022-04-30 DIAGNOSIS — R42 Dizziness and giddiness: Secondary | ICD-10-CM

## 2022-04-30 DIAGNOSIS — R2681 Unsteadiness on feet: Secondary | ICD-10-CM

## 2022-05-01 DIAGNOSIS — Z23 Encounter for immunization: Secondary | ICD-10-CM | POA: Diagnosis not present

## 2022-05-01 NOTE — Therapy (Signed)
OUTPATIENT PHYSICAL THERAPY VESTIBULAR/NEURO TREATMENT NOTE     Patient Name: Stacey [name] MRN: [id number] DOB:Aug 13, 1940, 81 y.o., female Today's Date: 05/04/2022  PCP: Shon Baton, MD REFERRING PROVIDER: Raylene Miyamoto, MD       PT End of Session - 05/04/22 1013     Visit Number 14    Number of Visits 17    Date for PT Re-Evaluation 05/08/22    Authorization Type Medicare/BCBS    Progress Note Due on Visit 89    PT Start Time 0929    PT Stop Time 1012    PT Time Calculation (min) 43 min    Equipment Utilized During Treatment Gait belt    Activity Tolerance Patient tolerated treatment well    Behavior During Therapy WFL for tasks assessed/performed                        Past Medical History:  Diagnosis Date   Abnormal finding of blood chemistry    Asthma    H/O measles    H/O varicella    Hypertension    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[place])    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   Lichen sclerosus 76/72/09   Asymptomatic   Low iron    Mitral valve prolapse    Osteoarthritis    Osteoporosis    Pneumonia    Post herpetic neuralgia    Rheumatoid arthritis(714.0)    Yeast infection    Past Surgical History:  Procedure Laterality Date   CHOLECYSTECTOMY  2011   IR ANGIO INTRA EXTRACRAN SEL COM CAROTID INNOMINATE BILAT MOD SED  06/18/2020   IR ANGIO VERTEBRAL SEL SUBCLAVIAN INNOMINATE UNI R MOD SED  06/18/2020   IR ANGIO VERTEBRAL SEL VERTEBRAL UNI L MOD SED  06/18/2020   WISDOM TOOTH EXTRACTION     Patient Active Problem List   Diagnosis Date Noted   Chronic cough 06/12/2020   Constipation    CAP (community acquired pneumonia) 02/09/2020   Acute on chronic respiratory failure with hypoxia ([place]) 07/06/2019   Fall at home, initial encounter 07/06/2019   Orthostatic hypotension 07/06/2019   Rib fractures 07/04/2019   Pneumonia due to COVID-19 virus 06/11/2019   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[place]) 05/07/2019   Acute asthma exacerbation 12/18/2018    Elevated troponin 12/18/2018   Acute bronchitis 08/30/2018   Chronic respiratory failure with hypoxia ([place]) 04/06/2018   Essential hypertension 03/24/2018   Depression 03/24/2018   Asthma exacerbation 03/23/2018   DOE (dyspnea on exertion) 03/21/2018   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 esophagitis 03/21/2018   Chronic lung disease    Hypoxia    Dyspnea 01/23/2018   ILD (interstitial lung disease) ([place]) 01/23/2018   Claw toe, acquired, 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   Restrictive lung disease 07/18/2013   Multiple pulmonary nodules/RA lung dz  05/29/2013   Cough variant asthma vs uacs  05/29/2013   Rheumatoid arthritis ([place]) 01/13/2012   Osteopenia 47/03/6282   Lichen sclerosus et atrophicus of the vulva 01/13/2012   Asthma 01/13/2012    ONSET DATE: 03/04/2022 (MD referral)  REFERRING DIAG: R42 (ICD-10-CM) - Dizziness  THERAPY DIAG:  Unsteadiness on feet  Other abnormalities of gait and mobility  Dizziness and giddiness  Rationale for Evaluation and Treatment Rehabilitation  SUBJECTIVE:   SUBJECTIVE STATEMENT: Report 7-8/10 dizziness just from walking in- feels like the sun contributed. Had a COVID shot on Friday and didn't feel so well on Saturday.  Pt accompanied by: self  PERTINENT HISTORY: See above for PMH  PAIN:  Are you having pain? No  PRECAUTIONS: None  FALLS: Has patient fallen in last 6 months? Yes. Number of falls 2  LIVING ENVIRONMENT: Lives with: lives with their spouse Lives in: House/apartment Stairs: Yes: Internal: 2nd level, but bedroom on first floor steps; has elevator and External: 2 steps; none Has following equipment at home: Single point cane, but does not use  PLOF: Independent  PATIENT GOALS To feel better and get my balance back  OBJECTIVE:     TODAY'S TREATMENT: 05/04/22 Activity Comments  Vitals at start of session 138/70 mmHg, 93% spO2, 78bpm  1/2 tandem each leg 2x30" Using SPC d/t imbalance; cues to push down firmly through toes  backwards walking 2x  with cane, 2x without CGA throughout and cues to push firmly through L big toe to avoid rolling ankle when stepping; slow and short step length  sidestepping + looking at target 4x 9f Report of diplopia with farther distances (reports this at baseline) ; c/o mild dizziness on last rep  Stair navigation Reviewed proper sequencing with cane; poor carryover from last session  gait training with U turns and figure 8 turns  Manual and verbal cues to keep cane sequencing with L foot throughout; standing rest breaks with wide BOS required to allow 4-5/10 dizziness to settle before proceeding; more significant dizziness after L turns        HPortalLast updated: 04/10/22 Access Code: [email or web address] Date: 04/10/2022 Prepared by: [facility] Program Notes perform exercises in a corner for safety!  Exercises - Corner Balance Feet Together With Eyes Closed  - 1 x daily - 7 x weekly - 1-3 sets - -30 sec hold - Staggered Stance Forward Backward Weight Shift with Counter Support  - 1 x daily - 5 x weekly - 1-2 sets - 10 reps - Alternating Step Forward with Support  - 1 x daily - 7 x weekly - 1-2 sets - 10 reps - Alternating Step Backward with Support  - 1 x daily - 7 x weekly - 1-2 sets - 10 reps - Side Stepping with Counter Support  - 1 x daily - 7 x weekly - 1-2 sets - 10 reps - Single Leg Stance with Support  - 1 x daily - 5 x weekly - 1 sets - 3 reps - 10 sec hold - Forward Step Up  - 1 x daily - 5 x weekly - 10 sets - 10 reps - Seated Left Head Turns Vestibular Habituation  - 1 x daily - 5 x weekly - 2 sets - 30 sec hold - Seated Head Nod  - 1 x daily - 5 x weekly - 2 sets - 30 sec hold    ------------------------------------------------------------------  Below measures were taken at time of initial evaluation unless otherwise specified:  DIAGNOSTIC FINDINGS:  Per CT 12/2021:  Disc levels:  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 No acute intracranial abnormality. 2. No evidence of acute cervical spine fracture or traumatic malalignment.  COGNITION: Overall cognitive status: Within functional limits for tasks assessed   Cervical ROM:  WFL  Active A/PROM (deg) eval  Flexion   Extension   Right lateral flexion   Left lateral flexion   Right rotation   Left rotation   (Blank rows = not tested)  STRENGTH: Decreased functional strength, per 5x sit<>stand  TRANSFERS: Assistive device utilized: None  Sit to stand: Modified independence  Stand to sit: Modified independence  GAIT: Gait pattern: step through pattern and wide BOS Distance walked: 60 ft Assistive device utilized: None Level of assistance: SBA Comments: With turn around furniture, mild LOB towards R side, able to self-recover  FUNCTIONAL TESTs:  5 times sit to stand: 22.22 Dynamic Gait Index: 11/24  M-CTSIB  Condition 1: Firm Surface, EO 30 Sec, Normal Sway  Condition 2: Firm Surface, EC 30 Sec, Mild Sway  Condition 3: Foam Surface, EO 30 Sec, Moderate Sway  Condition 4: Foam Surface, EC 9.31 Sec, Severe Sway    PATIENT SURVEYS:  FOTO Intake score = 50; predicted score = 58   VESTIBULAR ASSESSMENT   GENERAL OBSERVATION: Unsteadiness, cautious with gait    SYMPTOM BEHAVIOR:   Subjective history: Reports that her balance has been off for several years.  Pretty immediate symptoms of imbalance, need to grab for support   Non-Vestibular symptoms:  falls   Type of dizziness: Diplopia, Imbalance (Disequilibrium), Spinning/Vertigo, and Unsteady with head/body turns   Frequency: several times per week    Duration: approx 5 minutes   Aggravating factors: Induced by motion: occur when walking, bending down to the ground, turning body quickly, and turning head quickly   Relieving factors:  sit down   Progression of symptoms: worse   OCULOMOTOR EXAM:   Ocular Alignment:  normal   Ocular ROM:  slightly limited R and L; moves head with eye motion initially   Spontaneous Nystagmus: absent   Gaze-Induced Nystagmus: absent   Smooth Pursuits: intact   Saccades: intact   Convergence/Divergence: decreased convergence noted     VESTIBULAR - OCULAR REFLEX:    Slow VOR: Normal   VOR Cancellation: Comment: limited head turns to keep eyes focused on target   Head-Impulse Test: HIT Right: negative HIT Left: positive, no c/o   Dynamic Visual Acuity:  Attempted, but not able to be assessed, due to pt not having her glasses on      MOTION SENSITIVITY:    Motion Sensitivity Quotient  Intensity: 0 = none, 1 = Lightheaded, 2 = Mild, 3 = Moderate, 4 = Severe, 5 = Vomiting  Intensity  1. Sitting to supine   2. Supine to L side   3. Supine to R side   4. Supine to sitting   5. L Hallpike-Dix   6. Up from L    7. R Hallpike-Dix   8. Up from R    9. Sitting, head  tipped to L knee   10. Head up from L  knee   11. Sitting, head  tipped to R knee   12. Head up from R  knee   13. Sitting head turns x5   14.Sitting head nods x5   15. In stance, 180  turn to L    16. In stance, 180  turn to R      PATIENT EDUCATION: Education details: Eval results, POC Person educated: Patient Education method: Explanation Education comprehension: verbalized understanding   GOALS: Goals reviewed with patient? Yes  SHORT TERM GOALS: = LTGs   LONG TERM GOALS: Target date: 05/08/2022  Pt will be independent with HEP for improved balance, transfers, gait and overall c/o decreased dizziness Baseline:  Goal status: IN PROGRESS 04/10/22  2.  Pt will improve 5x sit<>stand to less than or equal to 15 sec to demonstrate improved functional strength and transfer efficiency. Baseline: 22.22 sec>19. Seconds 04/08/2022, 25.13 sec 04/10/22 Goal status: ONGOING 04/10/22  3.  Pt will Condition  4 on MCTSIB, for 30 seconds with mild-moderate sway, to demonstrate improved  vestibular system use for balance. Baseline: 9.31 sec prior to opening eyes. 5.28 sec, 7.25 sec 04/08/2022, 4 sec 04/10/22 Goal status: ONGOING 04/10/22  4. Pt will improve DGI score by at least 4 points to decrease fall risk. Baseline: 11/24 03/12/22, 15/24 04/10/22 Goal status: IN PROGRESS 04/10/22  5.  Pt will improve FOTO score to 58 for improved overall functional mobility and decreased unsteadiness. Baseline: 45 04/10/22 Goal status: IN PROGRESS 04/10/22  ASSESSMENT:  CLINICAL IMPRESSION:  Patient arrived to session with report of 7-8/10 dizziness from walking into session- notes that she feels that the sun being so bright contribute to her symptoms. Symptoms improved with sitting break and vitals were normal, thus proceeded with session while monitoring symptoms. Patient with significant unsteadiness with narrow BOS activities today; required [facility] support. Able to perform walking activities in multiple directions with cueing for increased speed and step length. Turning activities brought on more significant symptoms to the L vs. R side today. Patient very slightly more SOB than usual with today's activities- allowed for sitting rest breaks after standing activities to address this. Patient tolerated session well despite elevated symptoms and increased fatigue. No complaints upon leaving.   OBJECTIVE IMPAIRMENTS Abnormal gait, decreased balance, decreased knowledge of use of DME, decreased mobility, difficulty walking, decreased strength, and dizziness.   ACTIVITY LIMITATIONS transfers and locomotion level  PARTICIPATION LIMITATIONS: shopping and community activity  PERSONAL FACTORS 3+ comorbidities: See above  are also affecting patient's functional outcome.   REHAB POTENTIAL: Good  CLINICAL DECISION MAKING: Evolving/moderate complexity  EVALUATION COMPLEXITY: Moderate   PLAN: PT FREQUENCY: 2x/week  PT DURATION: other: 4 weeks  PLANNED INTERVENTIONS: Therapeutic exercises,  Therapeutic activity, Neuromuscular re-education, Balance training, Gait training, Patient/Family education, Self Care, Joint mobilization, Vestibular training, Canalith repositioning, and DME instructions  PLAN FOR NEXT SESSION:  LTGs due next week (discuss POC).  Reassess stair/gait training and continue work on gaze stabilization activities and turns as this does bring on dizziness and patient with + B HIT on 04/10/22; Plan to add back in eyes closed exercises as appropriate.  Continue to work on gait training with cane on outdoor/unlevel surfaces, especially curb steps.      Janene Harvey, PT, DPT 05/04/22 10:13 AM  [facility] at [facility] [address], [place], [place] [address] Phone # [phone] Fax # [phone]

## 2022-05-04 ENCOUNTER — Ambulatory Visit: Payer: Medicare Other | Admitting: Physical Therapy

## 2022-05-04 ENCOUNTER — Encounter: Payer: Self-pay | Admitting: Physical Therapy

## 2022-05-04 DIAGNOSIS — R42 Dizziness and giddiness: Secondary | ICD-10-CM | POA: Diagnosis not present

## 2022-05-04 DIAGNOSIS — R2681 Unsteadiness on feet: Secondary | ICD-10-CM | POA: Diagnosis not present

## 2022-05-04 DIAGNOSIS — R2689 Other abnormalities of gait and mobility: Secondary | ICD-10-CM

## 2022-05-05 NOTE — Therapy (Signed)
OUTPATIENT PHYSICAL THERAPY VESTIBULAR/NEURO DISCHARGE SUMMARY     Patient Name: Stacey [name] MRN: [id number] DOB:04/10/1941, 81 y.o., female Today's Date: 05/06/2022  PCP: Shon Baton, MD REFERRING PROVIDER: Raylene Miyamoto, MD  Progress Note Reporting Period 04/13/22 to 05/06/22  See note below for Objective Data and Assessment of Progress/Goals.         PT End of Session - 05/06/22 1013     Visit Number 15    Number of Visits 17    Date for PT Re-Evaluation 05/08/22    Authorization Type Medicare/BCBS    Progress Note Due on Visit 43    PT Start Time 0931    PT Stop Time 1011    PT Time Calculation (min) 40 min    Equipment Utilized During Treatment Gait belt    Activity Tolerance Patient tolerated treatment well    Behavior During Therapy WFL for tasks assessed/performed                         Past Medical History:  Diagnosis Date   Abnormal finding of blood chemistry    Asthma    H/O measles    H/O varicella    Hypertension    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[place])    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   Lichen sclerosus 04/88/89   Asymptomatic   Low iron    Mitral valve prolapse    Osteoarthritis    Osteoporosis    Pneumonia    Post herpetic neuralgia    Rheumatoid arthritis(714.0)    Yeast infection    Past Surgical History:  Procedure Laterality Date   CHOLECYSTECTOMY  2011   IR ANGIO INTRA EXTRACRAN SEL COM CAROTID INNOMINATE BILAT MOD SED  06/18/2020   IR ANGIO VERTEBRAL SEL SUBCLAVIAN INNOMINATE UNI R MOD SED  06/18/2020   IR ANGIO VERTEBRAL SEL VERTEBRAL UNI L MOD SED  06/18/2020   WISDOM TOOTH EXTRACTION     Patient Active Problem List   Diagnosis Date Noted   Chronic cough 06/12/2020   Constipation    CAP (community acquired pneumonia) 02/09/2020   Acute on chronic respiratory failure with hypoxia ([place]) 07/06/2019   Fall at home, initial encounter 07/06/2019   Orthostatic hypotension 07/06/2019   Rib fractures 07/04/2019    Pneumonia due to COVID-19 virus 06/11/2019   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[place]) 05/07/2019   Acute asthma exacerbation 12/18/2018   Elevated troponin 12/18/2018   Acute bronchitis 08/30/2018   Chronic respiratory failure with hypoxia ([place]) 04/06/2018   Essential hypertension 03/24/2018   Depression 03/24/2018   Asthma exacerbation 03/23/2018   DOE (dyspnea on exertion) 03/21/2018   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 esophagitis 03/21/2018   Chronic lung disease    Hypoxia    Dyspnea 01/23/2018   ILD (interstitial lung disease) ([place]) 01/23/2018   Claw toe, acquired, 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   Restrictive lung disease 07/18/2013   Multiple pulmonary nodules/RA lung dz  05/29/2013   Cough variant asthma vs uacs  05/29/2013   Rheumatoid arthritis ([place]) 01/13/2012   Osteopenia 16/94/5038   Lichen sclerosus et atrophicus of the vulva 01/13/2012   Asthma 01/13/2012    ONSET DATE: 03/04/2022 (MD referral)  REFERRING DIAG: R42 (ICD-10-CM) - Dizziness  THERAPY DIAG:  Unsteadiness on feet  Other abnormalities of gait and mobility  Dizziness and giddiness  Rationale for Evaluation and Treatment Rehabilitation  SUBJECTIVE:   SUBJECTIVE STATEMENT: Nothing much new. Trying to remember how to go up/down the stairs with the cane.  Pt  accompanied by: self  PERTINENT HISTORY: See above for PMH  PAIN:  Are you having pain? No  PRECAUTIONS: None  FALLS: Has patient fallen in last 6 months? Yes. Number of falls 2  LIVING ENVIRONMENT: Lives with: lives with their spouse Lives in: House/apartment Stairs: Yes: Internal: 2nd level, but bedroom on first floor steps; has elevator and External: 2 steps; none Has following equipment at home: Single point cane, but does not use  PLOF: Independent  PATIENT GOALS To feel better and get my balance back  OBJECTIVE:     TODAY'S TREATMENT: 05/06/22   [facility] PT Assessment - 05/06/22 0001       Standardized Balance Assessment   Standardized Balance Assessment Five  Times Sit to Stand    Five times sit to stand comments  20.97 sec without UEs      Dynamic Gait Index   Level Surface Mild Impairment    Change in Gait Speed Mild Impairment    Gait with Horizontal Head Turns Mild Impairment   mild dizziness   Gait with Vertical Head Turns Moderate Impairment   mild dizziness   Gait and Pivot Turn Moderate Impairment    Step Over Obstacle Normal    Step Around Obstacles Moderate Impairment    Steps Mild Impairment    Total Score 14    DGI comment: with cane             M-CTSIB  Condition 1: Firm Surface, EO 30 Sec, Moderate Sway  Condition 2: Firm Surface, EC 30 Sec, Severe Sway  Condition 3: Foam Surface, EO 10 Sec, Severe Sway  Condition 4: Foam Surface, EC NT d/t safety Sec,  -  Sway     Activity Comments  FOTO 45.0769  Seated head nods to targets 30" No dizziness  Seated head turns to targets 30" 4/10 dizziness   Staggered ant/pos wt shift   SLS with UE support         PATIENT EDUCATION: Education details: edu on exam findings and progress towards goals, encouraged personal training or exercising in the water moving forward, HEP review and consolidation  Person educated: Patient Education method: Explanation, Demonstration, Tactile cues, Verbal cues, and Handouts Education comprehension: verbalized understanding and returned demonstration     HOME EXERCISE PROGRAM Last updated: 05/06/22 Access Code: [email or web address] URL: [email or web address] Date: 05/06/2022 Prepared by: [facility]  Program Notes perform exercises in a corner for safety!  Exercises - Corner Balance Feet Together With Eyes Closed  - 1 x daily - 7 x weekly - 1-3 sets - -30 sec hold - Staggered Stance Forward Backward Weight Shift with Counter Support  - 1 x daily - 5 x weekly - 1-2 sets - 10 reps - Single Leg Stance with Support  - 1 x daily - 5 x weekly - 1 sets - 3 reps - 10 sec hold - Forward Step Up  - 1 x  daily - 5 x weekly - 10 sets - 10 reps - Seated Left Head Turns Vestibular Habituation  - 1 x daily - 5 x weekly - 2 sets - 30 sec hold - Standing with Head Nod  - 1 x daily - 5 x weekly - 2 sets - 30 sec hold    ------------------------------------------------------------------  Below measures were taken at time of initial evaluation unless otherwise specified:  DIAGNOSTIC FINDINGS:  Per CT 12/2021:  Disc levels:  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 No acute intracranial abnormality. 2. No evidence of acute cervical spine fracture or traumatic malalignment.  COGNITION: Overall cognitive status: Within functional limits for tasks assessed   Cervical ROM:  WFL  Active A/PROM (deg) eval  Flexion   Extension   Right lateral flexion   Left lateral flexion   Right rotation   Left rotation   (Blank rows = not tested)  STRENGTH: Decreased functional strength, per 5x sit<>stand  TRANSFERS: Assistive device utilized: None  Sit to stand: Modified independence Stand to sit: Modified independence  GAIT: Gait pattern: step through pattern and wide BOS Distance walked: 60 ft Assistive device utilized: None Level of assistance: SBA Comments: With turn around furniture, mild LOB towards R side, able to self-recover  FUNCTIONAL TESTs:  5 times sit to stand: 22.22 Dynamic Gait Index: 11/24  M-CTSIB  Condition 1: Firm Surface, EO 30 Sec, Normal Sway  Condition 2: Firm Surface, EC 30 Sec, Mild Sway  Condition 3: Foam Surface, EO 30 Sec, Moderate Sway  Condition 4: Foam Surface, EC 9.31 Sec, Severe Sway    PATIENT SURVEYS:  FOTO Intake score = 50; predicted score = 58   VESTIBULAR ASSESSMENT   GENERAL OBSERVATION: Unsteadiness, cautious with gait    SYMPTOM BEHAVIOR:   Subjective history: Reports that her balance has been off for several years.  Pretty immediate symptoms of imbalance, need to grab for support   Non-Vestibular symptoms:   falls   Type of dizziness: Diplopia, Imbalance (Disequilibrium), Spinning/Vertigo, and Unsteady with head/body turns   Frequency: several times per week    Duration: approx 5 minutes   Aggravating factors: Induced by motion: occur when walking, bending down to the ground, turning body quickly, and turning head quickly   Relieving factors:  sit down   Progression of symptoms: worse   OCULOMOTOR EXAM:   Ocular Alignment: normal   Ocular ROM:  slightly limited R and L; moves head with eye motion initially   Spontaneous Nystagmus: absent   Gaze-Induced Nystagmus: absent   Smooth Pursuits: intact   Saccades: intact   Convergence/Divergence: decreased convergence noted     VESTIBULAR - OCULAR REFLEX:    Slow VOR: Normal   VOR Cancellation: Comment: limited head turns to keep eyes focused on target   Head-Impulse Test: HIT Right: negative HIT Left: positive, no c/o   Dynamic Visual Acuity:  Attempted, but not able to be assessed, due to pt not having her glasses on      MOTION SENSITIVITY:    Motion Sensitivity Quotient  Intensity: 0 = none, 1 = Lightheaded, 2 = Mild, 3 = Moderate, 4 = Severe, 5 = Vomiting  Intensity  1. Sitting to supine   2. Supine to L side   3. Supine to R side   4. Supine to sitting   5. L Hallpike-Dix   6. Up from L    7. R Hallpike-Dix   8. Up from R    9. Sitting, head  tipped to L knee   10. Head up from L  knee   11. Sitting, head  tipped to R knee   12. Head up from R  knee   13. Sitting head turns x5   14.Sitting head nods x5   15. In stance, 180  turn to L    16. In stance, 180  turn to R      PATIENT EDUCATION: Education details: Eval results, POC Person educated: Patient Education method: Explanation Education comprehension: verbalized  understanding   GOALS: Goals reviewed with patient? Yes  SHORT TERM GOALS: = LTGs   LONG TERM GOALS: Target date: 05/08/2022  Pt will be independent with HEP for improved balance,  transfers, gait and overall c/o decreased dizziness Baseline:  Goal status: MET 05/06/22  2.  Pt will improve 5x sit<>stand to less than or equal to 15 sec to demonstrate improved functional strength and transfer efficiency. Baseline: 22.22 sec>19. Seconds 04/08/2022, 25.13 sec 04/10/22, 20.97 05/06/22 Goal status: PARTIALLY MET 05/06/22  3.  Pt will Condition 4 on MCTSIB, for 30 seconds with mild-moderate sway, to demonstrate improved vestibular system use for balance. Baseline: 9.31 sec prior to opening eyes. 5.28 sec, 7.25 sec 04/08/2022, 4 sec 04/10/22; unable d/t safety 05/06/22 Goal status: NOT MET 05/06/22  4. Pt will improve DGI score by at least 4 points to decrease fall risk. Baseline: 11/24 03/12/22, 15/24 04/10/22; 14/24 05/06/22 Goal status: PARTIALLY MET 05/06/22  5.  Pt will improve FOTO score to 58 for improved overall functional mobility and decreased unsteadiness. Baseline: 45 04/10/22; 45.0769 05/06/22 Goal status: NOT MET 05/06/22  ASSESSMENT:  CLINICAL IMPRESSION: Patient arrived to session without complaints. Patient scored 14/24 on DGI, indicating increased risk of falls but improved from last assessment. Patient still demonstrates inconsistent cane use with ambulation, which contributes to imbalance/lack of stability. Patient demonstrated increased imbalance with multisensory balance testing today. However, was able to perform 5xSTS test at quicker pace. Reviewed and consolidated HEP for max carryover and compliance and recommended personal training or exercising in the water. Patient reported understanding of all edu provided and without complaints at end of session. Patient ready for DC at this time d/t plateau.    OBJECTIVE IMPAIRMENTS Abnormal gait, decreased balance, decreased knowledge of use of DME, decreased mobility, difficulty walking, decreased strength, and dizziness.   ACTIVITY LIMITATIONS transfers and locomotion level  PARTICIPATION LIMITATIONS:  shopping and community activity  PERSONAL FACTORS 3+ comorbidities: See above  are also affecting patient's functional outcome.   REHAB POTENTIAL: Good  CLINICAL DECISION MAKING: Evolving/moderate complexity  EVALUATION COMPLEXITY: Moderate   PLAN: PT FREQUENCY: 2x/week  PT DURATION: other: 4 weeks  PLANNED INTERVENTIONS: Therapeutic exercises, Therapeutic activity, Neuromuscular re-education, Balance training, Gait training, Patient/Family education, Self Care, Joint mobilization, Vestibular training, Canalith repositioning, and DME instructions  PLAN FOR NEXT SESSION:  DC at this time   PHYSICAL THERAPY DISCHARGE SUMMARY  Visits from Start of Care: 15  Current functional level related to goals / functional outcomes: See above clinical impression   Remaining deficits: Dizziness, imbalance    Education / Equipment: HEP  Plan: Patient agrees to discharge.  Patient goals were partially met. Patient is being discharged due to plateau.           Janene Harvey, PT, DPT 05/06/22 10:17 AM  [facility] at [facility] [address] [place], [place], [place] [address] Phone # [phone] Fax # [phone]

## 2022-05-06 ENCOUNTER — Encounter: Payer: Self-pay | Admitting: Physical Therapy

## 2022-05-06 ENCOUNTER — Ambulatory Visit: Payer: Medicare Other | Admitting: Physical Therapy

## 2022-05-06 DIAGNOSIS — R42 Dizziness and giddiness: Secondary | ICD-10-CM | POA: Diagnosis not present

## 2022-05-06 DIAGNOSIS — R2689 Other abnormalities of gait and mobility: Secondary | ICD-10-CM | POA: Diagnosis not present

## 2022-05-06 DIAGNOSIS — R2681 Unsteadiness on feet: Secondary | ICD-10-CM

## 2022-05-22 DIAGNOSIS — M0579 Rheumatoid arthritis with rheumatoid factor of multiple sites without organ or systems involvement: Secondary | ICD-10-CM | POA: Diagnosis not present

## 2022-05-28 DIAGNOSIS — M810 Age-related osteoporosis without current pathological fracture: Secondary | ICD-10-CM | POA: Diagnosis not present

## 2022-06-10 ENCOUNTER — Other Ambulatory Visit: Payer: Self-pay | Admitting: Internal Medicine

## 2022-06-23 ENCOUNTER — Ambulatory Visit: Payer: Medicare Other | Admitting: Physical Therapy

## 2022-07-14 ENCOUNTER — Other Ambulatory Visit (HOSPITAL_BASED_OUTPATIENT_CLINIC_OR_DEPARTMENT_OTHER): Payer: Self-pay | Admitting: Cardiology

## 2022-07-14 DIAGNOSIS — E7800 Pure hypercholesterolemia, unspecified: Secondary | ICD-10-CM

## 2022-07-14 NOTE — Telephone Encounter (Signed)
Rx request sent to pharmacy.  

## 2022-07-15 ENCOUNTER — Other Ambulatory Visit: Payer: Self-pay

## 2022-07-15 ENCOUNTER — Ambulatory Visit: Payer: Medicare Other | Attending: Otolaryngology

## 2022-07-15 DIAGNOSIS — R2681 Unsteadiness on feet: Secondary | ICD-10-CM | POA: Diagnosis not present

## 2022-07-15 DIAGNOSIS — R2689 Other abnormalities of gait and mobility: Secondary | ICD-10-CM | POA: Diagnosis not present

## 2022-07-15 DIAGNOSIS — M6281 Muscle weakness (generalized): Secondary | ICD-10-CM | POA: Insufficient documentation

## 2022-07-15 DIAGNOSIS — R262 Difficulty in walking, not elsewhere classified: Secondary | ICD-10-CM | POA: Insufficient documentation

## 2022-07-15 DIAGNOSIS — R42 Dizziness and giddiness: Secondary | ICD-10-CM | POA: Insufficient documentation

## 2022-07-15 NOTE — Therapy (Signed)
OUTPATIENT PHYSICAL THERAPY LOWER EXTREMITY EVALUATION   Patient Name: Stacey [name] MRN: [id number] DOB:11/01/1940, 82 y.o., female Today's Date: 07/15/2022  END OF SESSION:  PT End of Session - 07/15/22 1654     Visit Number 1    Date for PT Re-Evaluation 09/09/22    Authorization Type Medicare/BCBS    Progress Note Due on Visit 10    PT Start Time 0272    PT Stop Time 1521    PT Time Calculation (min) 34 min    Activity Tolerance Patient tolerated treatment well    Behavior During Therapy WFL for tasks assessed/performed             Past Medical History:  Diagnosis Date   Abnormal finding of blood chemistry    Asthma    H/O measles    H/O varicella    Hypertension    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[place])    Leukoplakia of vulva 53/66/44   Lichen sclerosus 03/47/42   Asymptomatic   Low iron    Mitral valve prolapse    Osteoarthritis    Osteoporosis    Pneumonia    Post herpetic neuralgia    Rheumatoid arthritis(714.0)    Yeast infection    Past Surgical History:  Procedure Laterality Date   CHOLECYSTECTOMY  2011   IR ANGIO INTRA EXTRACRAN SEL COM CAROTID INNOMINATE BILAT MOD SED  06/18/2020   IR ANGIO VERTEBRAL SEL SUBCLAVIAN INNOMINATE UNI R MOD SED  06/18/2020   IR ANGIO VERTEBRAL SEL VERTEBRAL UNI L MOD SED  06/18/2020   WISDOM TOOTH EXTRACTION     Patient Active Problem List   Diagnosis Date Noted   Chronic cough 06/12/2020   Constipation    CAP (community acquired pneumonia) 02/09/2020   Acute on chronic respiratory failure with hypoxia ([place]) 07/06/2019   Fall at home, initial encounter 07/06/2019   Orthostatic hypotension 07/06/2019   Rib fractures 07/04/2019   Pneumonia due to COVID-19 virus 06/11/2019   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[place]) 05/07/2019   Acute asthma exacerbation 12/18/2018   Elevated troponin 12/18/2018   Acute bronchitis 08/30/2018   Chronic respiratory failure with hypoxia ([place]) 04/06/2018   Essential hypertension 03/24/2018    Depression 03/24/2018   Asthma exacerbation 03/23/2018   DOE (dyspnea on exertion) 03/21/2018   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 esophagitis 03/21/2018   Chronic lung disease    Hypoxia    Dyspnea 01/23/2018   ILD (interstitial lung disease) ([place]) 01/23/2018   Claw toe, acquired, 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   Restrictive lung disease 07/18/2013   Multiple pulmonary nodules/RA lung dz  05/29/2013   Cough variant asthma vs uacs  05/29/2013   Rheumatoid arthritis ([place]) 01/13/2012   Osteopenia 59/56/3875   Lichen sclerosus et atrophicus of the vulva 01/13/2012   Asthma 01/13/2012    PCP: Shon Baton, MD  REFERRING PROVIDER: Shon Baton, MD  REFERRING DIAG:  R26.81 (ICD-10-CM) - Unsteadiness on feet  Z91.81 (ICD-10-CM) - History of falling    THERAPY DIAG:  Other abnormalities of gait and mobility - Plan: PT plan of care cert/re-cert  Unsteadiness on feet - Plan: PT plan of care cert/re-cert  Rationale for Evaluation and Treatment: Rehabilitation  ONSET DATE: chronic- many falls over the the past 2 years  SUBJECTIVE:   SUBJECTIVE STATEMENT: Pt presents to PT with frequent falls with 4 falls since 09/2021.  Pt [facility] from August-October 2023 and reports that she is no longer doing these exercises.  Vertigo has been ruled out per pt report  by ENT although pt does report some dizziness.   PERTINENT HISTORY: Frequent falls, RA, osteoporosis, Interstitial lyme disease, Lt thigh pain after shingles 20 years ago PAIN:  Are you having pain? No  PRECAUTIONS: Fall and Other: osteopenia  WEIGHT BEARING RESTRICTIONS: No  FALLS:  Has patient fallen in last 6 months? Yes. 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   PLOF: Independent and Leisure: sewing  PATIENT GOALS: reduce falls, improve gait, improve strength   NEXT MD VISIT: 2 months    OBJECTIVE:   DIAGNOSTIC FINDINGS: NA    COGNITION: Overall cognitive status: Within functional limits for tasks assessed     SENSATION: WFL    POSTURE: rounded shoulders, forward head, and flexed trunk   PALPATION: NA  LOWER EXTREMITY MMT:  4/5 bil hip strength, 4+/5 bil knee strength, 4/5 ankle strength    FUNCTIONAL TESTS:  5 times sit to stand: 21.87with use of hands, backs of legs on chair, and uncontrolled descent   Timed up and go (TUG): 19.84 seconds with use of cane   GAIT: Distance walked: 100 Assistive device utilized: Quad cane small base Level of assistance: Modified independence Comments: scissoring gait, reduced trunk rotation   TODAY'S TREATMENT:                                                                     DATE: 07/15/22  HEP established- see below  PATIENT EDUCATION:  Education details: Access Code: [email or web address] Person educated: Patient Education method: Explanation, Demonstration, and Handouts Education comprehension: verbalized understanding and returned demonstration  HOME EXERCISE PROGRAM: Access Code: [email or web address] URL: [email or web address] Date: 07/15/2022 Prepared by: Claiborne Billings  Exercises - Seated Long Arc Quad  - 3 x daily - 7 x weekly - 2 sets - 10 reps - 5 hold - Seated March   - 3 x daily - 7 x weekly - 3 sets - 10 reps - Seated Heel Toe Raises   - 3 x daily - 7 x weekly - 2 sets - 10 reps - Sit to Stand  - 3 x daily - 7 x weekly - 1-2 sets - 5-10 reps  ASSESSMENT:  CLINICAL IMPRESSION: Patient is a 82 y.o. female who was seen today for physical therapy evaluation and treatment for frequent falls.  Pt was treated at [facility] from August to October 2023 and is no longer doing her balance exercises.  Pt reports a fall in November as her most recent.  Pt reports that she has seen ENT for full work-up and doesn't have any underlying diagnosis related to her balance. Pt with increased time with 5x sit to stand and  uses back of legs to control motion and uncontrolled descent with this.  TUG is 21.87 seconds and pt becomes dizzy with change of position.   Gait is unstable with cane and pt demonstrates scissoring gait. With use of a cane.  Patient will benefit from skilled PT to address the below impairments and improve overall function.   OBJECTIVE IMPAIRMENTS: decreased activity tolerance, decreased endurance, decreased mobility, difficulty walking, impaired flexibility, improper body mechanics, postural dysfunction, and pain.   ACTIVITY  LIMITATIONS: standing, squatting, transfers, and locomotion level  PARTICIPATION LIMITATIONS: meal prep, cleaning, shopping, community activity, and yard work  PERSONAL FACTORS: 1-2 comorbidities: frequent falls, osteoporosis , lung disease  are also affecting patient's functional outcome.   REHAB POTENTIAL: Good  CLINICAL DECISION MAKING: Evolving/moderate complexity  EVALUATION COMPLEXITY: Moderate   GOALS: Goals reviewed with patient? Yes  SHORT TERM GOALS: Target date: 08/12/2022   Be independent in initial HEP Baseline: Goal status: INITIAL  2.  Improve LE strength to perform sit to stand transition without use of legs on back of chair Baseline:  Goal status: INITIAL  3.  Perform 5x sit to stand in < or = to 17 seconds to reduce falls risk Baseline:  Goal status: INITIAL  4.  Ambulate short distances in clinic with normalized base of support and 50% reduced scissoring  Baseline:  Goal status: INITIAL   LONG TERM GOALS: Target date: 09/09/2022    Be independent in advanced HEP Baseline:  Goal status: INITIAL  2.  Perform sit to stand with min to no use of UE support due to improved LE strength Baseline:  Goal status: INITIAL  3.  Perform TUG in < or = to 13 seconds without instability to reduce falls risk Baseline:  Goal status: INITIAL  4.  Perform 5x sit to stand in < or = to 14 seconds to reduce falls risk Baseline:  Goal status:  INITIAL  5.  Report no falls at home or in the community Baseline:  Goal status: INITIAL    PLAN:  PT FREQUENCY: 2x/week  PT DURATION: 8 weeks  PLANNED INTERVENTIONS: Therapeutic exercises, Therapeutic activity, Neuromuscular re-education, Balance training, Gait training, Patient/Family education, Self Care, Joint mobilization, Aquatic Therapy, Cryotherapy, Moist heat, Manual therapy, and Re-evaluation  PLAN FOR NEXT SESSION: review HEP, work on LE strength, functional mobility and gait   [employer], PT 07/15/22 4:59 PM

## 2022-07-20 DIAGNOSIS — M0579 Rheumatoid arthritis with rheumatoid factor of multiple sites without organ or systems involvement: Secondary | ICD-10-CM | POA: Diagnosis not present

## 2022-07-21 ENCOUNTER — Ambulatory Visit: Payer: Medicare Other

## 2022-07-21 DIAGNOSIS — R2689 Other abnormalities of gait and mobility: Secondary | ICD-10-CM | POA: Diagnosis not present

## 2022-07-21 DIAGNOSIS — R2681 Unsteadiness on feet: Secondary | ICD-10-CM | POA: Diagnosis not present

## 2022-07-21 DIAGNOSIS — R262 Difficulty in walking, not elsewhere classified: Secondary | ICD-10-CM | POA: Diagnosis not present

## 2022-07-21 DIAGNOSIS — R42 Dizziness and giddiness: Secondary | ICD-10-CM

## 2022-07-21 DIAGNOSIS — M6281 Muscle weakness (generalized): Secondary | ICD-10-CM

## 2022-07-21 NOTE — Therapy (Signed)
OUTPATIENT PHYSICAL THERAPY LOWER EXTREMITY TREATMENT NOTE   Patient Name: Stacey [name] MRN: [id number] DOB:12-08-1940, 82 y.o., female Today's Date: 07/21/2022  END OF SESSION:  PT End of Session - 07/21/22 1408     Visit Number 2    Number of Visits 17    Date for PT Re-Evaluation 09/09/22    Authorization Type Medicare/BCBS    Progress Note Due on Visit 10    PT Start Time 1406    PT Stop Time 1446    PT Time Calculation (min) 40 min    Equipment Utilized During Treatment Gait belt    Activity Tolerance Patient tolerated treatment well    Behavior During Therapy WFL for tasks assessed/performed             Past Medical History:  Diagnosis Date   Abnormal finding of blood chemistry    Asthma    H/O measles    H/O varicella    Hypertension    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[place])    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   Lichen sclerosus 64/40/34   Asymptomatic   Low iron    Mitral valve prolapse    Osteoarthritis    Osteoporosis    Pneumonia    Post herpetic neuralgia    Rheumatoid arthritis(714.0)    Yeast infection    Past Surgical History:  Procedure Laterality Date   CHOLECYSTECTOMY  2011   IR ANGIO INTRA EXTRACRAN SEL COM CAROTID INNOMINATE BILAT MOD SED  06/18/2020   IR ANGIO VERTEBRAL SEL SUBCLAVIAN INNOMINATE UNI R MOD SED  06/18/2020   IR ANGIO VERTEBRAL SEL VERTEBRAL UNI L MOD SED  06/18/2020   WISDOM TOOTH EXTRACTION     Patient Active Problem List   Diagnosis Date Noted   Chronic cough 06/12/2020   Constipation    CAP (community acquired pneumonia) 02/09/2020   Acute on chronic respiratory failure with hypoxia ([place]) 07/06/2019   Fall at home, initial encounter 07/06/2019   Orthostatic hypotension 07/06/2019   Rib fractures 07/04/2019   Pneumonia due to COVID-19 virus 06/11/2019   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[place]) 05/07/2019   Acute asthma exacerbation 12/18/2018   Elevated troponin 12/18/2018   Acute bronchitis 08/30/2018   Chronic respiratory  failure with hypoxia ([place]) 04/06/2018   Essential hypertension 03/24/2018   Depression 03/24/2018   Asthma exacerbation 03/23/2018   DOE (dyspnea on exertion) 03/21/2018   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 esophagitis 03/21/2018   Chronic lung disease    Hypoxia    Dyspnea 01/23/2018   ILD (interstitial lung disease) ([place]) 01/23/2018   Claw toe, acquired, 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   Restrictive lung disease 07/18/2013   Multiple pulmonary nodules/RA lung dz  05/29/2013   Cough variant asthma vs uacs  05/29/2013   Rheumatoid arthritis ([place]) 01/13/2012   Osteopenia 74/25/9563   Lichen sclerosus et atrophicus of the vulva 01/13/2012   Asthma 01/13/2012    PCP: Shon Baton, MD  REFERRING PROVIDER: Shon Baton, MD  REFERRING DIAG:  R26.81 (ICD-10-CM) - Unsteadiness on feet  Z91.81 (ICD-10-CM) - History of falling    THERAPY DIAG:  Other abnormalities of gait and mobility  Unsteadiness on feet  Dizziness and giddiness  Muscle weakness (generalized)  Rationale for Evaluation and Treatment: Rehabilitation  ONSET DATE: chronic- many falls over the the past 2 years  SUBJECTIVE:   SUBJECTIVE STATEMENT: Patient reports doing ok.  I am doing my exercises.   PERTINENT HISTORY: Frequent falls, RA, osteoporosis, Interstitial lyme disease, Lt thigh pain after shingles 20 years ago PAIN:  Are you having pain? No  PRECAUTIONS: Fall and Other: osteopenia  WEIGHT BEARING RESTRICTIONS: No  FALLS:  Has patient fallen in last 6 months? Yes. 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   PLOF: Independent and Leisure: sewing  PATIENT GOALS: reduce falls, improve gait, improve strength   NEXT MD VISIT: 2 months   OBJECTIVE:   DIAGNOSTIC FINDINGS: NA    COGNITION: Overall cognitive status: Within functional limits for tasks  assessed     SENSATION: WFL    POSTURE: rounded shoulders, forward head, and flexed trunk   PALPATION: NA  LOWER EXTREMITY MMT:  4/5 bil hip strength, 4+/5 bil knee strength, 4/5 ankle strength    FUNCTIONAL TESTS:  5 times sit to stand: 21.87with use of hands, backs of legs on chair, and uncontrolled descent   Timed up and go (TUG): 19.84 seconds with use of cane   GAIT: Distance walked: 100 Assistive device utilized: Quad cane small base Level of assistance: Modified independence Comments: scissoring gait, reduced trunk rotation   TODAY'S TREATMENT:                                                                     DATE: 07/21/22  Nustep x 5 min level 1 Sit to stand x 5 without use of hands (verbal cues regarding weight shift fwd and knees flexed) Ascending and descending steps using reciprocal gait going up and step to gait coming down.   Seated LAQ with 2 lbs x 20 both Seated march with 2 lbs x 20 both Seated hi ER with 2 lbs 2 x 10 each Seated Alternating toe taps x 20 for coordination Rocker board x 2 min for ankle mobility Gastroc and soleus stretches on propped rocker board x 1 min bilateral gastrocs then 2 x 30 sec each soleus Standing stepping fwd and back x 10 each LE   TODAY'S TREATMENT:                                                                     DATE: 07/15/22  HEP established- see below  PATIENT EDUCATION:  Education details: Access Code: [email or web address] Person educated: Patient Education method: Explanation, Demonstration, and Handouts Education comprehension: verbalized understanding and returned demonstration  HOME EXERCISE PROGRAM: Access Code: [email or web address] URL: [email or web address] Date: 07/15/2022 Prepared by: Claiborne Billings  Exercises - Seated Long Arc Quad  - 3 x daily - 7 x weekly - 2 sets - 10 reps - 5 hold - Seated March   - 3 x daily - 7 x weekly - 3 sets - 10 reps - Seated Heel Toe Raises   - 3 x daily - 7 x weekly - 2 sets - 10  reps - Sit to Stand  - 3 x daily - 7 x weekly - 1-2 sets - 5-10 reps  ASSESSMENT:  CLINICAL IMPRESSION: Patient was able to complete all  strengthening tasks with ease.  She was able to do sit to stand from mat table without UE use x 5 with verbal cues for proper weight shift fwd.  She had several losses of balance with stepping fwd and back and needed to grab onto barre to recover.  She is able to do reciprocal gait on ascending steps but cannot safely do reciprocal gait on descending.  She demonstrated achilles tightness on descending stairs, therefore we added calf stretching.   Gait is unstable with cane and she continues to demonstrate scissoring gait.  Patient will benefit from skilled PT to address the below impairments and improve overall function.   OBJECTIVE IMPAIRMENTS: decreased activity tolerance, decreased endurance, decreased mobility, difficulty walking, impaired flexibility, improper body mechanics, postural dysfunction, and pain.   ACTIVITY LIMITATIONS: standing, squatting, transfers, and locomotion level  PARTICIPATION LIMITATIONS: meal prep, cleaning, shopping, community activity, and yard work  PERSONAL FACTORS: 1-2 comorbidities: frequent falls, osteoporosis , lung disease  are also affecting patient's functional outcome.   REHAB POTENTIAL: Good  CLINICAL DECISION MAKING: Evolving/moderate complexity  EVALUATION COMPLEXITY: Moderate   GOALS: Goals reviewed with patient? Yes  SHORT TERM GOALS: Target date: 08/12/2022   Be independent in initial HEP Baseline: Goal status: INITIAL  2.  Improve LE strength to perform sit to stand transition without use of legs on back of chair Baseline:  Goal status: INITIAL  3.  Perform 5x sit to stand in < or = to 17 seconds to reduce falls risk Baseline:  Goal status: INITIAL  4.  Ambulate short distances in clinic with normalized base of support and 50% reduced scissoring  Baseline:  Goal status: INITIAL   LONG TERM  GOALS: Target date: 09/09/2022    Be independent in advanced HEP Baseline:  Goal status: INITIAL  2.  Perform sit to stand with min to no use of UE support due to improved LE strength Baseline:  Goal status: INITIAL  3.  Perform TUG in < or = to 13 seconds without instability to reduce falls risk Baseline:  Goal status: INITIAL  4.  Perform 5x sit to stand in < or = to 14 seconds to reduce falls risk Baseline:  Goal status: INITIAL  5.  Report no falls at home or in the community Baseline:  Goal status: INITIAL    PLAN:  PT FREQUENCY: 2x/week  PT DURATION: 8 weeks  PLANNED INTERVENTIONS: Therapeutic exercises, Therapeutic activity, Neuromuscular re-education, Balance training, Gait training, Patient/Family education, Self Care, Joint mobilization, Aquatic Therapy, Cryotherapy, Moist heat, Manual therapy, and Re-evaluation  PLAN FOR NEXT SESSION: continue to work on LE strength, functional mobility and gait along with balance training.    Anderson Malta B. Suhaas Agena, PT 07/21/22 3:10 PM

## 2022-07-23 ENCOUNTER — Ambulatory Visit: Payer: Medicare Other

## 2022-07-23 DIAGNOSIS — R2681 Unsteadiness on feet: Secondary | ICD-10-CM

## 2022-07-23 DIAGNOSIS — R2689 Other abnormalities of gait and mobility: Secondary | ICD-10-CM

## 2022-07-23 DIAGNOSIS — R42 Dizziness and giddiness: Secondary | ICD-10-CM | POA: Diagnosis not present

## 2022-07-23 DIAGNOSIS — M6281 Muscle weakness (generalized): Secondary | ICD-10-CM | POA: Diagnosis not present

## 2022-07-23 DIAGNOSIS — R262 Difficulty in walking, not elsewhere classified: Secondary | ICD-10-CM | POA: Diagnosis not present

## 2022-07-23 NOTE — Therapy (Signed)
OUTPATIENT PHYSICAL THERAPY LOWER EXTREMITY TREATMENT NOTE   Patient Name: Stacey [name] MRN: [id number] DOB:14-Jun-1941, 82 y.o., female Today's Date: 07/23/2022  END OF SESSION:  PT End of Session - 07/23/22 1502     Visit Number 3    Number of Visits 17    Date for PT Re-Evaluation 09/09/22    Authorization Type Medicare/BCBS    Progress Note Due on Visit 10    PT Start Time 9735    PT Stop Time 1530    PT Time Calculation (min) 45 min    Equipment Utilized During Treatment Gait belt    Activity Tolerance Patient tolerated treatment well             Past Medical History:  Diagnosis Date   Abnormal finding of blood chemistry    Asthma    H/O measles    H/O varicella    Hypertension    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[place])    Leukoplakia of vulva 32/99/24   Lichen sclerosus 26/83/41   Asymptomatic   Low iron    Mitral valve prolapse    Osteoarthritis    Osteoporosis    Pneumonia    Post herpetic neuralgia    Rheumatoid arthritis(714.0)    Yeast infection    Past Surgical History:  Procedure Laterality Date   CHOLECYSTECTOMY  2011   IR ANGIO INTRA EXTRACRAN SEL COM CAROTID INNOMINATE BILAT MOD SED  06/18/2020   IR ANGIO VERTEBRAL SEL SUBCLAVIAN INNOMINATE UNI R MOD SED  06/18/2020   IR ANGIO VERTEBRAL SEL VERTEBRAL UNI L MOD SED  06/18/2020   WISDOM TOOTH EXTRACTION     Patient Active Problem List   Diagnosis Date Noted   Chronic cough 06/12/2020   Constipation    CAP (community acquired pneumonia) 02/09/2020   Acute on chronic respiratory failure with hypoxia ([place]) 07/06/2019   Fall at home, initial encounter 07/06/2019   Orthostatic hypotension 07/06/2019   Rib fractures 07/04/2019   Pneumonia due to COVID-19 virus 06/11/2019   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[place]) 05/07/2019   Acute asthma exacerbation 12/18/2018   Elevated troponin 12/18/2018   Acute bronchitis 08/30/2018   Chronic respiratory failure with hypoxia ([place]) 04/06/2018   Essential hypertension  03/24/2018   Depression 03/24/2018   Asthma exacerbation 03/23/2018   DOE (dyspnea on exertion) 03/21/2018   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 esophagitis 03/21/2018   Chronic lung disease    Hypoxia    Dyspnea 01/23/2018   ILD (interstitial lung disease) ([place]) 01/23/2018   Claw toe, acquired, 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   Restrictive lung disease 07/18/2013   Multiple pulmonary nodules/RA lung dz  05/29/2013   Cough variant asthma vs uacs  05/29/2013   Rheumatoid arthritis ([place]) 01/13/2012   Osteopenia 96/22/2979   Lichen sclerosus et atrophicus of the vulva 01/13/2012   Asthma 01/13/2012    PCP: Shon Baton, MD  REFERRING PROVIDER: Shon Baton, MD  REFERRING DIAG:  R26.81 (ICD-10-CM) - Unsteadiness on feet  Z91.81 (ICD-10-CM) - History of falling    THERAPY DIAG:  Other abnormalities of gait and mobility  Unsteadiness on feet  Muscle weakness (generalized)  Rationale for Evaluation and Treatment: Rehabilitation  ONSET DATE: chronic- many falls over the the past 2 years  SUBJECTIVE:   SUBJECTIVE STATEMENT: Patient reports doing "fine".  No pain.    PERTINENT HISTORY: Frequent falls, RA, osteoporosis, Interstitial lyme disease, Lt thigh pain after shingles 20 years ago PAIN:  Are you having pain? No  PRECAUTIONS: Fall and Other: osteopenia  WEIGHT BEARING RESTRICTIONS:  No  FALLS:  Has patient fallen in last 6 months? Yes. 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   PLOF: Independent and Leisure: sewing  PATIENT GOALS: reduce falls, improve gait, improve strength   NEXT MD VISIT: 2 months   OBJECTIVE:   DIAGNOSTIC FINDINGS: NA    COGNITION: Overall cognitive status: Within functional limits for tasks assessed     SENSATION: WFL    POSTURE: rounded shoulders, forward head, and flexed trunk   PALPATION: NA  LOWER  EXTREMITY MMT:  4/5 bil hip strength, 4+/5 bil knee strength, 4/5 ankle strength    FUNCTIONAL TESTS:  5 times sit to stand: 21.87with use of hands, backs of legs on chair, and uncontrolled descent   Timed up and go (TUG): 19.84 seconds with use of cane   GAIT: Distance walked: 100 Assistive device utilized: Quad cane small base Level of assistance: Modified independence Comments: scissoring gait, reduced trunk rotation   TODAY'S TREATMENT:                                                                     DATE: 07/23/22  Nustep x 5 min level 1 Sit to stand x 5 without use of hands (verbal cues regarding weight shift fwd and knees flexed) Squat to mat table with balance pad x 5 Seated LAQ with 2.5 lbs x 20 both Seated march with 2.5 lbs x 20 both Seated hi ER with 2.5 lbs 2 x 10 each Seated Alternating toe taps x 20 for coordination Lateral band walks x 3 laps at back counter green loop Seated clam with green loop x 20   Rocker board x 2 min for ankle mobility Step up on 4 inch step x 10 each LE fwd and then lateral x 10 Gastroc and soleus stretches on propped rocker board x 1 min bilateral gastrocs then 5 x 10 sec each soleus  TODAY'S TREATMENT:                                                                     DATE: 07/21/22  Nustep x 5 min level 1 Sit to stand x 5 without use of hands (verbal cues regarding weight shift fwd and knees flexed) Ascending and descending steps using reciprocal gait going up and step to gait coming down.   Seated LAQ with 2 lbs x 20 both Seated march with 2 lbs x 20 both Seated hi ER with 2 lbs 2 x 10 each Seated Alternating toe taps x 20 for coordination Rocker board x 2 min for ankle mobility Gastroc and soleus stretches on propped rocker board x 1 min bilateral gastrocs then 2 x 30 sec each soleus Standing stepping fwd and back x 10 each LE   TODAY'S TREATMENT:  DATE: 07/15/22   HEP established- see below  PATIENT EDUCATION:  Education details: Access Code: [email or web address] Person educated: Patient Education method: Explanation, Demonstration, and Handouts Education comprehension: verbalized understanding and returned demonstration  HOME EXERCISE PROGRAM: Access Code: [email or web address] URL: [email or web address] Date: 07/15/2022 Prepared by: Claiborne Billings  Exercises - Seated Long Arc Quad  - 3 x daily - 7 x weekly - 2 sets - 10 reps - 5 hold - Seated March   - 3 x daily - 7 x weekly - 3 sets - 10 reps - Seated Heel Toe Raises   - 3 x daily - 7 x weekly - 2 sets - 10 reps - Sit to Stand  - 3 x daily - 7 x weekly - 1-2 sets - 5-10 reps  ASSESSMENT:  CLINICAL IMPRESSION: [place] is progressing appropriately.  She had only two posterior losses of balance on sit to stand and squat to table but responded well to vc's for fwd weight shift to correct this. She demonstrated improved heel to toe gait transitioning between tasks but continues to appear unsteady and with neurological interruptions in movement.  Patient will benefit from skilled PT to address the below impairments and improve overall function.   OBJECTIVE IMPAIRMENTS: decreased activity tolerance, decreased endurance, decreased mobility, difficulty walking, impaired flexibility, improper body mechanics, postural dysfunction, and pain.   ACTIVITY LIMITATIONS: standing, squatting, transfers, and locomotion level  PARTICIPATION LIMITATIONS: meal prep, cleaning, shopping, community activity, and yard work  PERSONAL FACTORS: 1-2 comorbidities: frequent falls, osteoporosis , lung disease  are also affecting patient's functional outcome.   REHAB POTENTIAL: Good  CLINICAL DECISION MAKING: Evolving/moderate complexity  EVALUATION COMPLEXITY: Moderate   GOALS: Goals reviewed with patient? Yes  SHORT TERM GOALS: Target date: 08/12/2022   Be independent in initial HEP Baseline: Goal status: INITIAL  2.   Improve LE strength to perform sit to stand transition without use of legs on back of chair Baseline:  Goal status: INITIAL  3.  Perform 5x sit to stand in < or = to 17 seconds to reduce falls risk Baseline:  Goal status: INITIAL  4.  Ambulate short distances in clinic with normalized base of support and 50% reduced scissoring  Baseline:  Goal status: INITIAL   LONG TERM GOALS: Target date: 09/09/2022    Be independent in advanced HEP Baseline:  Goal status: INITIAL  2.  Perform sit to stand with min to no use of UE support due to improved LE strength Baseline:  Goal status: INITIAL  3.  Perform TUG in < or = to 13 seconds without instability to reduce falls risk Baseline:  Goal status: INITIAL  4.  Perform 5x sit to stand in < or = to 14 seconds to reduce falls risk Baseline:  Goal status: INITIAL  5.  Report no falls at home or in the community Baseline:  Goal status: INITIAL    PLAN:  PT FREQUENCY: 2x/week  PT DURATION: 8 weeks  PLANNED INTERVENTIONS: Therapeutic exercises, Therapeutic activity, Neuromuscular re-education, Balance training, Gait training, Patient/Family education, Self Care, Joint mobilization, Aquatic Therapy, Cryotherapy, Moist heat, Manual therapy, and Re-evaluation  PLAN FOR NEXT SESSION: continue to work on LE strength, functional mobility and gait along with balance training.    Anderson Malta B. Leler Brion, PT 07/23/22 3:32 PM  [facility] [address], [place], [place] [address] Phone # [phone] Fax [phone]

## 2022-07-24 ENCOUNTER — Ambulatory Visit (INDEPENDENT_AMBULATORY_CARE_PROVIDER_SITE_OTHER): Payer: Medicare Other | Admitting: Internal Medicine

## 2022-07-24 ENCOUNTER — Encounter: Payer: Self-pay | Admitting: Internal Medicine

## 2022-07-24 VITALS — BP 136/80 | HR 63 | Temp 98.0°F | Ht 65.0 in | Wt 133.6 lb

## 2022-07-24 DIAGNOSIS — J8489 Other specified interstitial pulmonary diseases: Secondary | ICD-10-CM | POA: Diagnosis not present

## 2022-07-24 DIAGNOSIS — R296 Repeated falls: Secondary | ICD-10-CM | POA: Diagnosis not present

## 2022-07-24 DIAGNOSIS — R2689 Other abnormalities of gait and mobility: Secondary | ICD-10-CM | POA: Diagnosis not present

## 2022-07-24 DIAGNOSIS — M359 Systemic involvement of connective tissue, unspecified: Secondary | ICD-10-CM | POA: Diagnosis not present

## 2022-07-24 DIAGNOSIS — J45909 Unspecified asthma, uncomplicated: Secondary | ICD-10-CM | POA: Diagnosis not present

## 2022-07-24 DIAGNOSIS — Z79899 Other long term (current) drug therapy: Secondary | ICD-10-CM

## 2022-07-24 NOTE — Patient Instructions (Addendum)
ICD-10-CM   1. Interstitial lung disease due to connective tissue disease ([place])  J84.89    M35.9     2. Encounter for long-term current use of high risk medication  Z79.899     3. Asthma, unspecified asthma severity, unspecified whether complicated, unspecified whether persistent  J45.909     4. Balance problem  R26.89     5. Frequent falls  R29.6      Interstitial lung disease due to connective tissue disease ([place]) Encounter for long-term current use of high risk medication  -lung function with continued decline aos of aug 2023 but relative clinical stability stince then - Tolerating lower dose ofev better   - no diarrhea  - weight loss stopped  - not needing zofran with lower dose  - normal LFT at [facility] NOv 2023    Plan - continue ofev '100mg'$  twice daily - can take zofran '4mg'$  twice daily as needed with ofev  - -Continue Rituxan and steroids through the rheumatologist - continuie  breo 100 dosing daily once with albuterol as needed     Asthma, unspecified asthma severity, unspecified whether complicated, unspecified whether persistent  - stable  Plan  -continue breo scheduled with albuteraol as neede  Balance problem Frequent falls   - sorry to hear about your falls x 4 last  85motjs.  -glad ENT workup normal - glad you saw neuro  - glad you are doing rehab  Plan Per PCP  Followup  - spiromerty and dlco in 4 months  - 30 min  visit in 4 months but after PFT;  - symptoms score and walking desaturation test at followup

## 2022-07-24 NOTE — Progress Notes (Signed)
Brief patient profile:  82 yowf  never smoker with allergies/inhalers as child outgrew by [employer] then  RA since around 2000  Prednisone  x decades and prev eval by Dr Joya Gaskins around 2004 for sob resolved s maint rx and referred 05/26/2013 by Dr Shelia Media for bronchitis and abn cxr   History of Present Illness  05/26/2013 1st [facility] office visit/ Wert cc June 2014 dx pna  In [place] and remicade stopped and 100% better and placed arencia in September 2014  then abruptly worse first week in November with cough green sputum s nasal symptoms, fever low grade and no cp or cough and completely recovered prior to [place] does not recall abx but issue is why keeps getting sick and abn CT Chest (see below).   Arthritis symptoms well controlled at present on Rx for RA rec Nexium 40 mg Take 30-60 min before first meal of the day and add pepcid 20 mg one at bedtime whenever coughing.     10/19/2017  f/u ov/Wert re:  RA lung dz  Chief Complaint  Patient presents with   Follow-up    Cough is much improved, but has not resolved yet. Cough is non prod. She has not had to use her neb.   Dyspnea:  Not limited by breathing from desired activities  But some doe x steps Cough: daytime > noct dry  Sleep: fine  SABA use:  No saba Medrol 4 mg a/w 82m per day/ ok control of arthritis  rec Start back on gabapentin up to 300 mg each am  in addition to the the two at bedtime  If not better increase the medrol to 8 mg daily until bettter then taper back to where you      01/10/2018  f/u ov/Wert re:  RA  Lung dz Medrol  4 mg  One alternating with a half Chief Complaint  Patient presents with   Follow-up    PFT's done. Her breathing has been gradually worsening since the last visit. She has occ cough- non prod.   Dyspnea gradually worse since last ov:  MMRC1 =  MMRC3 = can't walk 100 yards even at a slow pace at a flat grade s stopping due to sob    Gradually x 3 m / more fatigue / no change in  arthritis  Cough: not an issue rec Protonix 40 mg Take 30-60 min before first meal of the day  GERD diet   01/17/2018 acute extended ov/Wert re: cough on medrol 4 mg  One a/w one half  Chief Complaint  Patient presents with   Acute Visit    started coughing 01/11/18- occ prod with minimal green sputum.  She states also wheezing and having increased SOB.    abruptly worse 01/11/18 with severe 24/7 coughing >>  prod min green mucus esp in am/ assoc with subjective wheeze and did not follow previous contingencies re flutter / saba/ increase medrol and admits she does not rember those written instructions nor how to use the neb provided .  No fever/ comfortable at rest sitting  rec For cough > mucinex dm 1200 mg every 12 hours and cough into the flutter valve as much as possible  Doxycycline 100 mg twice daily x 10 days with glass of water Medrol 457mx 2 now and take 2  daily until cough is better then 1 daily x 5 days and then resume the previous dose  Shortness of breath/ wheezing/ still coughing >  albuterol neb every 4 hours as needed      01/20/2018 acute extended ov/Wert re: refractory cough and sob 01/11/18 Chief Complaint  Patient presents with   Acute Visit    she is not feeling better, coughing , very SOB, wheezing  mucus now clear/scant  on doxy/ neb machine not working (tube would not plug into the side s adequate force and she was not capable of applying it due to RA hands. Cough/ wheeze/ sob 24/7 / flutter not helping/ can't lie down at hs   rec While coughing protonix 40 mg Take 30- 60 min before your first and last meals of the day  Shortness of breath/ wheezing/ still coughing > albuterol neb every 4 hours as needed  Depomedrol 120 mg IM and medrol 32 mg daily x 2 days,  then 16 mg x 3 days,  Then 8 mg x 4 days , then resume the 4 mg daily  For severe cough > tylenol 3# one every 4 hours if needed  Go to ER if condition worsens on above plan       Date of admission: 01/22/2018              Date of discharge: 01/27/2018   History of present illness: As per the H and P dictated on admission, " Halen Mossbarger  is a 82 y.o. female, w Rheumatoid arthritis, ILD Asthma, apparently c/o increase in dyspnea this evening. Dry cough.   Denies fever, chills, cp, palp,  N/v, diarrhea, brbpr, black stool.   Pt notes recently being given steroid injection in office as well as being placed on doxycycline. This might have helped slightly but pt worse  Hospital Course:  Summary of her active problems in the hospital is as following. 1 dyspnea/hypoxemia/ILD Concerned that likely GERD Is causing ILD. Patient with cough.   Patient with complaints of awakening with cough and also with oral intake which is slightly improved since 01/24/2018. assessed by speech therapy and speech therapy raising concern of esophageal component but no signs of aspiration.   2D echo with a EF of 55 to 60% with no wall motion abnormalities, grade 1 diastolic dysfunction.  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 Pulmonary felt that the patient should be on scheduled Reglan. I have placed the patient on scheduled potassium before sleep. Continue steroids on discharge continue Mucinex and Claritin as well as inhalers. Patient will follow-up with pulmonary outpatient   2.  Gastroesophageal reflux disease Continue PPI and H2 blocker.  I changed PPI to [facility].   3.  Rheumatoid arthritis Outpatient follow-up.     4.  Anxiety Continue Effexor.       All other chronic medical condition were stable during the hospitalization.  Patient was ambulatory without any assistance. On the day of the discharge the patient's vitals were stable , and no other acute medical condition were reported by patient. the patient was felt safe to be discharge at home with family.   Consultants: PCCM  Procedures: Echocardiogram       03/21/2018  f/u ov/Wert re:   S/p admit was transiently  better  and downhill since Labor day on medrol 4 mg daily  Last orencia on Sept 4th 2019  Chief Complaint  Patient presents with   Acute Visit    Per patient, she has had a dry cough since July 2019. She has been wheezing as well. Increased fatigue. Body aches. Denies any fever or chills.   Dyspnea:  MMRC4  = sob if tries to leave home or while getting dressed   Cough: harsh/ hacking mostly dry/ has flutter not using    SABA use: not much better with rx   No obvious day to day or daytime variability or assoc excess/ purulent sputum or mucus plugs or hemoptysis or cp or chest tightness, subjective wheeze or overt sinus or hb symptoms.     Also denies any obvious fluctuation of symptoms with weather or environmental changes or other aggravating or alleviating factors except as outlined above   No unusual exposure hx or h/o childhood pna/ asthma or knowledge of premature birth.   INpatient consult 03/26/18 82 year old with rheumatoid arthoritis.At baseline the patient lives at home with her husband and is independent of ADLs.  Has been on many immune suppressants over decades and curently on orencia x 4 year and prednisone. Does not recollect being on bactrim/dapsone. Chart mentions BOOP/MAI in 2001 but she denies this. 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 Got admitted. Rx with steroids. Per Notes - clnical suspicion of  arytenoid inflmmation related wheeze noticed (she also reports asthma NOS). Follolwup with ENT recommended (but not seen one as yet). She also appears to have passed swallow with rec for regular diet with thin liquids but did to have mild eso stricture and reflux during testing . PFT shows 10% FVC decline for first ime. CT chest at this tme (aug 2019) showed new rLL infiltrate.  ECHO July 2019 without evicence of elevated  PASP and saw cards [facility] June 2019 and was considered to have worsenin dyspnea due to [facility] issues (reports stress test at [facility] that was normal but I cannot see it)   She reports after discharge she got better but in last several weeks has deteriorated with cough and dyspnea. There is new hypoxemia (currently RA with nail polish and poor circulation  - 89% pulse ox) needing 2L [place]. Per [facility] improved with steroids and abx. CTA 03/24/2018 => shows that RLL inifltrate has improved . Other chronic ILD changes + and small  Hiatal hernia + witthout change.   Review of lab work does show eosinophilia at time of admision   EVENTS 03/21/18 - IgE -5, blood allegy panel - negative, 03/23/2018 - - admit . HIGH EOS 2300, ESR 48, BNP 89 , HIV neg 9/12- PCT negative, RVP negative 9/14 -  PCT < 0.1, Urine strep - negative, MRSA PCR - positive. IgE - normal 4, Blood allergy panel repeat - negative 03/26/2018 - leading consideration for airway (BO in RA +/- asthma) related flare either due to MRSA bronchitis or clinical suspicion of arytenoid inflmmation +/- GERD relatd flare (she has small hiatal hernia)  +/- ? Dysphagia  up causing mild hypoxemia acute resp failure, wheeze . Allergy and IgE blood work negative thought. Patient reports being better but says she is choking on drinkin water [facility] MD says wheeze improved significantly with steroids.  RN says was down to RA yesterday evening but needed 1L [place] at sleep. Today -Room air at rest 94% and desaturated to 86% walking 60 feet 03/27/2018  - better. Off o2 at rest. STill coughs with water and when lies down.  Husband at bedside. Both requesting ILD clinic followup . Desaturated t 79% walking 90 feet.   OV 04/12/2018  Subjective:  Patient ID: Stacey [name], female , DOB: 02/10/1941 , age 27 y.o. , MRN: [id number] , ADDRESS: [place] [address]   04/12/2018 -   Chief Complaint  Patient presents with   Consult    Pt is a former MW pt.  Pt  denies any current complaints of cough, SOB, or CP but states the cough she originally had ended her up in the hosp 9/11-9/17 with dx acute respiratory failure. Pt does wear 2pulse with exertion and also wears 2L continuous when at home.     HPI WAYNETTE TOWERS 82 y.o. -presents for follow-up to the ILD clinic.  She is known to have rheumatoid arthritis with ILD changes.  She had been followed by Dr. Christinia Gully.  However in July 2019 in September 2019 she has had 2 admissions to the hospital with respiratory distress and hypoxemic respiratory failure.  In the first 1 that seem to be right lower lobe infiltrate and then she improved from it but in the second 1 even though the right lower lobe infiltrates were better she still was hypoxemic.  Acid reflux and dysphagia was considered a possible etiology but she passed swallow study 2 times.  They thought she had some reflux.  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  At the same time it is not clear if the ILD is progressive based on pulmonary function testing below   At this point in time she tells me that she is getting home physical therapy.  Her fatigue is improving but it is not fully resolved.  She was discharged on continuous oxygen which she is using.  However she is feeling less short of breath.  Today in fact when we turned her oxygen off and walked her she did not desaturate and this is a significant improvement.  She is on monthly Orencia through the [facility] rheumatologist Dr. Eda Paschal.  At this point in time she is put the Orencia on hold.  She told me that she is been on Orencia for 4 years and never had a respiratory exacerbation still recently x 2.  Although before going on Orencia she had pneumonia while on Remicade and the Remicade.  In terms of her rheumatoid arthritis she hardly has any pain.  Her joint architecture is fairly well-preserved because of various immunomodulators over  time.  She says that she was on Remicade for years and when she stopped it for 8 weeks before the switch to [place] she never really had a relapse in her rheumatoid arthritis.  She is largely pain and stiffness free.  She believes she can go without  her Orencia for a while.  Review of the literature shows greater than 10% chance of a respiratory infection especially COPD exacerbation.  Although the time frame for this is unclear.       OV 06/07/2018  Subjective:  Patient ID: Stacey [name], female , DOB: 01/31/1941 , age 2 y.o. , MRN: [id number] , ADDRESS: [place] [address]   06/07/2018 -   Chief Complaint  Patient presents with   Follow-up    ILD, PFT done today, some wheezing and coughing but better tha before   Rheumatoid arthritis ILD and asthma/obstructive lung disease phenotype on Dulera  HPI KAMALA KOLTON 81 y.o. -presents for routine follow-up with her husband.  She is here to follow-up with [facility] Dr. Stann Mainland.  She plans to do this in  December 2019.  She continues to be off Orencia.  Her joints are slowly getting stiff again.  She believes that she will need to be back on immunosuppression agent again.  She currently continues Medrol 4 mg alternating with 2 mg.  This for her rheumatoid arthritis.  In terms of her joints she continues on Medrol 4 mg alternating with 2 mg but not on any other immunosuppression agent.  Overall she is been stable but for the last 2 weeks has had green sputum and wheezing and chest congestion and cough.  She recently visited her husband who was hospitalized and walking the long hallways at [facility] made a short of breath but she thinks this is probably baseline for her.  There are no other new issues.  She did have spirometry and DLCO and this shows a decline compared to September 2019 and a similar to July 2019.  It is documented below.  This is probably reflective of a flareup   OV  07/21/2018  Subjective:  Patient ID: Stacey [name], female , DOB: [birth year] , age 69 y.o. , MRN: [id number] , ADDRESS: [place] [address]   07/21/2018 -   Chief Complaint  Patient presents with   Follow-up    Pt states she has been doing well since last visit. States she is about to begin Rituxan with [facility]. Pt still becomes SOB with exertion. Denies any complaints of cough or CP.   Rheumatoid arthritis ILD and asthma/obstructive lung disease phenotype on Dulera  HPI OLEVA KOO 82 y.o. -presents for follow-up of the above.  Last seen just before Thanksgiving 2019.  In the interim overall stable although on June 23, 2018 she climbs a steep flight of stairs which is unusual exertion for her and she became very dyspneic.  Following day saw Dr. Stann Mainland at [facility] rheumatology and was given Z-Pak and prednisone and started feeling better.  Although it is not fully clear to me she had fever and bronchitic symptoms.  I reviewed Dr. Stann Mainland note.  Dr. Stann Mainland is decided to start Rituxan for rheumatoid arthritis.  She is only having some minimal joint pain at this point.  She is off Orencia and continues to be off [place].  She did have some blood work with [place] before starting Rituxan.  She is due to see Dr. Stann Mainland within the next week and start her Rituxan.  Her liver function test July 18, 2017 is normal hemoglobin is normal.  CRP is also normal.  We did spirometry and walking desaturation test.  These show improvement compared to before and these are documented below.  Currently she not using nighttime or daytime oxygen.  She is wondering if she could switch rheumatology care to [facility].  This is because while she likes [employer] she is getting older and more frail and feels some body local would be of help.  I have sent a message to Dr. Estanislado Pandy inquiring.  Certainly we can help her with Rituxan infusions at [facility] if needed.  She  will check on this with her [facility] rheumatologist.       OV 03/27/2019  Subjective:  Patient ID: Stacey [name], female , DOB: 12-28-1940 , age 76 y.o. , MRN: [id number] , ADDRESS: [place] [address]  Rheumatoid arthritis ILD and asthma/obstructive lung disease phenotype on [facility]   03/27/2019 -  Rourine fu   HPI DARBY FLEEMAN 82 y.o. -presents for  the above.  Last seen in January 2020.  After that she has seen Dr. Stann Mainland rheumatology at [facility].  She is now getting Rituxan 2 doses every 6 months.  She says this is helped her joints and her stiffness.  She is a little bit more mobile than usual.  However in terms of her respiratory status she continues to have episodic cough.  In June 2020 she again got hypoxemic and got admitted.  Since then she has had episodic cough.  She had a respiratory exacerbation in June 2020 for the admission she got steroids.  This seemed to help.  She is also on a higher dose Dulera right now.  In terms of her cough this seems to be her biggest problem.  She seems to be on Dulera, Singulair scheduled with also Tessalon and Delsym and DuoNeb.  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  Her last high-resolution CT scan of the chest was 1 year ago.  She says the dyspnea itself is not worse.  She is currently not using oxygen.  On exam she did have some wheezing.  Currently she has white and brown sputum but this is baseline.  In terms of a COVID wrist she has been tested recently couple of times and this is been negative.  She is isolating well.  She wanted to know COVID prevention activities and risk status and masking strategies.      OV 05/24/2019  Subjective:  Patient ID: Stacey [name], female , DOB: [birth year] , age 39 y.o. , MRN: [id number] , ADDRESS: [place] [address]   05/24/2019 -   Chief  Complaint  Patient presents with   Follow-up    Pt was recently in the hosp due to ILD. Pt states that she has been better since being out of the hosp.   Rheumatoid arthritis ILD and asthma/obstructive lung disease phenotype on Dulera/Singulair  Post herpetic neuralgia - on elavil, gabapentin, effexor  HPI MAEVIS MUMBY 82 y.o. -returns for follow-up.  At the last visit approximately 2 months ago she was reporting worsening cough following an admission in summer 2020.  Her pulmonary function test suggested worsening ILD status.  Therefore we requested a high-resolution CT chest which she did in October 2020.  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  However in the interim after the CT scan was done towards the end of October 2020 she developed worsening of her cough over 2 weeks and also associated shortness of breath but significantly the cough is much worse.  She ended up getting admitted to the hospital.  There was some hypoxemia.  By this time she had finished a ENT evaluation that did not show any involvement of the arytenoids.  Pulmonary was consulted.  She was given a prednisone burst which she just finished I believe yesterday.  She is back on her baseline Medrol.  And she is feeling better.  Her oxygen status is improved although she is using oxygen at night now.  She is really frustrated with these recurrent flareups and these admissions which ended up with her having a wheeze and also hypoxemia.  Currently she is on her baseline Medrol for rheumatoid arthritis associated with Dulera and Singulair.  She is also on losartan for blood pressure.  Her walking desaturation test is slightly worse than baseline.  She has a GI consult pending because of the recurrent episodes of cough and flareups.  We went over exposure history.  We used interstitial lung disease questionnaire for the exposure history.  Specifically she denies any electronic cigarette use of  marijuana use of cocaine use or any IV drug abuse.  [place] the last 14 years.  Asked extensive questions about the home environment it is positive for nebulizer use but the nebulizer does not have mildew or mold in it.  Otherwise no organic antigen exposure.  The house is not damp.  There is no mold or mildew in the shower curtain.  There is no humidifier use no steam iron use.  No Jacuzzi use.  No misting [place] outside to inside the house.  No pet birds.  No pet gerbils no feather pillows.  There is no mold in the [facility] duct.  She does not do any gardening.  Does not use wind instruments.  Also 122 question occupational history elicited and essentially negative.  The other issue is that she has polypharmacy.  She is asking for my help in reducing her medications.  She is on 3 medications for postherpetic neuralgia.  She is on losartan      OV 06/21/2019  Subjective:  Patient ID: Stacey [name], female , DOB: Jun 04, 1941 , age 46 y.o. , MRN: [id number] , ADDRESS: [place] [address]   06/21/2019 -   Chief Complaint  Patient presents with   televisit    hosp 11/29-12/4 due to covid with pna. pt said that she is doing okay after recent hosp but states she has no energy.      KAHLI MAYON 82 y.o. - last visit 05/24/2019. Diagnosed with covid-19 on 05/31/2019. Admitted 06/11/2019  - 06/16/2019. Quarantine ends 06/21/2019 today. Rx with Remdesviri and Decadron. Husband also on phone. Questions  1. Quarantine ends - from 06/22/19\ and she is not contagious and likely resistant to reinfection to covid for another few months  2. She is on 10 day dexamethasone for covid - today is last day for it  3. She is on dulera. Hospital gave combivent and she does not want do combivent - this is fine  4. Ofev for ILD - not started it yet . She had questions about side effects. Explained GI and LFT monirtong. SHe wanted to  wait till Jan 2021 and start it . I am ok with that.   5. Small hiat   IMPRESSION: HRCT OCt 2020 1. 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 Findings have progressed since 2017 and 2019 high-resolution chest CT studies. 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 Findings are consistent with UIP per consensus guidelines: Diagnosis of Idiopathic Pulmonary Fibrosis: An Official ATS/ERS/JRS/ALAT Clinical Practice Guideline. [place], Iss 5, [phone], Mar 13 2017. 2. 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 4. Small hiatal hernia.   Aortic Atherosclerosis (ICD10-I70.0).     Electronically Signed   By: Ilona Sorrel M.D.   On: 04/24/2019 13:29    OV 08/03/2019 - face to face visit  Subjective:  Patient ID: Stacey [name], female , DOB: 1941-01-08 , age 50 y.o. , MRN: [id number] , ADDRESS: [place] [address]      HPI Victoriah M Martine 82  y.o. -presents for a visit. Her nintedanib has been delayed because of COVID-19. Also subsequently a fall. She saw a nurse practitioner on July 20, 2019 and they took a shared decision to start nintedanib. She had a ? telephone visit with Dr. Candie Mile the rheumatologist at [facility]. Patient scheduled for her next Rituxan in February 2021 but reviewed the notes indicates this could be pushed to early spring 2021. Dr. Stann Mainland is okay with the patient study got intubated at this point in time.  Patient has follow-up with Dr. Stann Mainland tomorrow at [facility].  At this face-to-face visit she is here with her husband.  She says she is much better after the Covid and also the fall that fractured her ribs.  Nevertheless all this is left her fatigue.  Infective fatigue scores are much worse.  Also in the last 2  weeks has had increased cough wheezing and shortness of breath.  The sputum was also changed color in the last 1 week to clear green.  This is all new.  Her symptom score is therefore a worse than her baseline.  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  She is only taking 100 mg once a day.  The plan was to go up to 100 mg twice a day next week.  This is a minimum effective dose.  The maximum dose is 150 mg twice a day.  Given her age and comorbidities we are starting at a low dose.  Part of this visit is to make sure that she is tolerating the drug fine.  And so far she is.  She is interested in the ILD-pro registry study done by the [place].   She is working with physical therapy for her fatigue.  Ambulatory Walk 07/20/2019 2 Lap- O2 92% RA; HR 109 No shortness of breath She did not need to stop Used walker    OV 09/20/2019  Subjective:  Patient ID: Stacey [name], female , DOB: Jul 11, 1941 , age 18 y.o. , MRN: [id number] , ADDRESS: [place] [address]     HPI ISABELLAROSE KOPE 82 y.o. - has 2nd covid vaccine later this week. Rituxan is end of the month. Baseline RA regiment is On dulera for associated obstructio of lung. 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 No side effects. Did see Derl Barrow for face to face visit early feb 2021 -> for bronchitis and felt better after prdnisone and zpak. Currently on medrol   8 mg day and staying there per Derl Barrow . Wants t oknow if she can taper. Wants to know how she can prevent care flare ups. Explained ofev, masking and social distancing prevent respiratory infection.Denies choking on food or aspirating. Denies mold in house - relatively news. Denies dog. Denies cat. Denies carious teeth. Denies post nasal drip  OVerll feels better and improved dyspnea.     OV 12/18/2019  Subjective:  Patient ID: Stacey [name], female , DOB: 04-Jul-1941 , age 35 y.o. , MRN: [id number] , ADDRESS: [place] [address]  PCP Shon Baton, MD Rheumatologist-Dr. Eda Paschal at [facility]/ILD: Dr. Veryl Speak    HPI Stacey [name] 82 y.o. 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  It has been a few month since I last saw her.  In this time she did not taper her  Medrol down.  She stated 8 mg/day.  She was originally taking the Medrol for her rheumatoid arthritis for many years.  She recently increased the dose from 4 mg / 2 mg every other day to 8 mg daily because of recurrent respiratory exacerbations.  She is asking for [facility] refills for obstructive lung disease.  She is getting Rituxan through Dr. Eda Paschal for rheumatoid arthritis.  Her next Rituxan is in September 2021.  She saw Dr. Stann Mainland in April 2021 and reviewed the note.  Her liver function test at the time was fine.  In terms of her ILD she continues on nintedanib 100 mg twice daily.  She says she has no side effects from the drug she is tolerating it quite well.  We discussed about increasing the dose but she wants to hold off because she just got a new supply.  However she is open to increasing the dose when the supply runs out.  She is due for liver function test today.  In terms of overall symptoms things have improved as can be seen below and the symptom score.Marland Kitchen  However her main concern is that of fatigue.  She is open to attending pulmonary rehabilitation.  She is not using oxygen at home.  Sometime back when we tested overnight oxygen desaturation test she did not desaturate.  Walking desaturation test today stable.  She does notice of note that her blood pressure has gone up.  She is working with her primary care physician on this.  She is on losartan.    OV 04/10/2020   Subjective:  Patient ID: Stacey [name], female , DOB: Mar 03, 1941, age 75 y.o. years. , MRN: [id number],  ADDRESS: [address]  [place] [address] PCP  Shon Baton, MD Providers : Treatment Team:  Attending Provider: Brand Males, MD   Chief Complaint  Patient presents with   Follow-up    CT scan 8/23, has not increased Ofev, shortness of breath with exertion.     HPI FREDDIE NGHIEM 82 y.o. -returns for follow-up.  Last week by myself early June 2021.  Then after that in early August 2020 when she got admitted for respiratory infection not otherwise specified.  She was hypoxemic on room air.  This followed a trip to [place].  In the follow-up phase end of August 2020 when she had high-resolution CT chest that showed increased groundglass opacities but when compared to a CT from 8 months ago.  Also chronic ILD had worsened.  I personally visualized the image at this point in time she is improved from this admission she is not using oxygen at all.  In fact walking desaturation test shows near baseline.  Nevertheless overall her symptom scores have declined over time ILD symptom score is listed below.  She is really concerned about several issues.  In terms of her ILD she was inquiring about escalating to 150 mg twice daily.  Even at 100 mg twice daily she is significantly symptomatic in terms of nonrespiratory issues such as nausea and dizziness and fatigue.  I did caution her that these could all get worse.  Therefore she is just opted to be at 100 mg twice daily  -Recurrent admissions for respiratory issues [I did address that some of this was Covid and fall related and not necessarily her usual summer exacerbation].  Nevertheless, I asked about exposures at home.  She does not have a feather blanket or feather pillow or feather jacket.  She denies any  mold or bird exposure or mildew exposure at home.  Does not do any gardening or wind instruments.  She is immunosuppressed and I did notice that she is not on Bactrim for PCP prophylaxis.  In 2019 when we checked G6PD the lab could not process this lab.  She  is interested in Bactrim prophylaxis.  She is interested in okay getting G6PD rechecked   -She is dealing with significant amount of dizziness.  Neurology is sending her to neuro rehab.  It is believed it is multifactorial.  I reviewed her recent CT head angio report.  I personally visualized the image   -She is also worried about cough.  Currently cough is tolerable but she says prior to her exacerbations cough gets worse.  We did discuss with her that if she is aspirating which she denied.  We did discuss about the possibility of starting Bactrim and monitoring her cough and she is fine with that.  I did review old records Lungs/Pleura: Biapical pleuroparenchymal scarring. Patchy and largely peripheral peribronchovascular ground-glass, increased from 07/03/2019. Underlying subpleural reticulation, traction bronchiectasis/bronchiolectasis and scattered honeycombing, similar to minimally increased from 07/03/2019. Calcified granulomas. No pleural fluid. Airway is unremarkable. No air trapping.   Upper Abdomen: Visualized portions of the liver, adrenal glands, kidneys, spleen, pancreas, stomach and bowel are unremarkable with the exception of a small hiatal hernia. Cholecystectomy. Calcified upper abdominal lymph nodes.   Musculoskeletal: Degenerative changes in the spine. No worrisome lytic or sclerotic lesions.   IMPRESSION: 1. Increased patchy pulmonary parenchymal ground-glass may be due to an atypical/viral pneumonia, including due to COVID-19. 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 2. Aortic atherosclerosis (ICD10-I70.0). Coronary artery calcification.     Electronically Signed   By: Lorin Picket M.D.   On: 03/04/2020 12:58    OV 08/01/2020  Subjective:  Patient ID: Stacey [name], female ,  DOB: 1941-06-05 , age 61 y.o. , MRN: [id number] , ADDRESS: [place] [address] PCP Shon Baton, MD Patient Care Team: Shon Baton, MD as PCP - General (Internal Medicine) Buford Dresser, MD as PCP - Cardiology (Cardiology)  This Provider for this visit: Treatment Team:  Attending Provider: Brand Males, MD    08/01/2020 -   Chief Complaint  Patient presents with   Follow-up    Doing well     HPI Stacey [name] 82 y.o. -returns for follow-up.  She says since last visit she has lost weight.  In fact tracking her weight it appears she is definitely lost weight.  She has occasional intermittent nausea once every few weeks this is because she does not time her nintedanib well with food.  She has a low appetite as well.  She tried to go up on the Internet but she is unable to.  She takes a low-dose 100 mg twice daily.  She is on Rituxan and prednisone.  She is up-to-date with her COVID-vaccine.  Current shortness of breath standpoint she is stable.  Symptoms are stable.  CT Chest data  OV 10/22/2020  Subjective:  Patient ID: Stacey [name], female , DOB: 1941/06/04 , age 46 y.o. , MRN: [id number] , ADDRESS: [address] [place] [address] PCP Shon Baton, MD Patient Care Team: Shon Baton, MD as PCP - General (Internal Medicine) Buford Dresser, MD as PCP - Cardiology (Cardiology)  This Provider for this visit:  Treatment Team:  Attending Provider: Brand Males, MD     HPI Stacey [name] 82 y.o. -returns for follow-up.  At this point in time for her ILD she is taking nintedanib 100 mg twice daily.  Last liver function test was in December 2021.  She is now attending pulmonary rehabilitation.  She is not sure it is helping her.  She tells me that she does not desaturate at pulmonary rehabilitation so she not using her oxygen.  Review of the records indicate same but it appears that today her dyspnea is worse in  terms of symptom score although she is denying that.  Walking desaturation test in the office today with a forehead probe suggest that pulse ox is declined.  Her pulmonary function test also shows 5% FVC decline.  She was surprised by this.  In talking to her realize that for coexistent obstructive lung disease she is no longer taking Dulera.  She is willing to take inhaler which she will have to twist instead of having to pump with her fingers because she has rheumatoid arthritis.  She is no longer losing weight though.  She had questions about taking monoclonal antibody prophylaxis.  She is under the impression she needs to take it every 6 months with Rituxan dosing.  I did explain to her that in the presence of Rituxan the body is not able to respond actively to vaccine and that is why she is having prophylactic antibody.  She is now had 2 doses the last 1 being a few weeks ago first 1 was in January 2022.  I have written to the [facility] coordinator Wilber Bihari 27 monoclonal antibody prophylaxis schedule.  Expressed to patient that because of monoclonal antibody prophylaxis she did not do the vaccine.  Last echocardiogram was in summer 2020.      OV 12/23/2020  Subjective:  Patient ID: Stacey [name], female , DOB: [birth year] , age 36 y.o. , MRN: [id number] , ADDRESS: [name] [address] PCP Shon Baton, MD Patient Care Team: Shon Baton, MD as PCP - General (Internal Medicine) Buford Dresser, MD as PCP - Cardiology (Cardiology)  This Provider for this visit: Treatment Team:  Attending Provider: Brand Males, MD   12/23/2020 -   Chief Complaint  Patient presents with   Follow-up    PFT performed today. Pt states she has been doing okay since last visit and denies any complaints.     HPI RAYAN DYAL 82 y.o. -returns for follow-up.  There is concern for progression.  Last visit we have made sure she added Breo.  She says she feels better.   In fact symptom score is better.  However pulmonary function test shows continued decline.  She is on the low-dose nintedanib.  She says if she does not take the nintedanib exactly with food and even if she takes it 15 to 20 minutes later she will have nausea.  Today she had nausea but otherwise if she is diligent she can maintain.  I informed her the pulmonary function test shows continued decline.  She is on Rituxan prednisone and low-dose nintedanib.  She initially said she will not be able to tolerate the higher dose nintedanib but after realizing that her nausea is mild and her weight loss is stable she says she is willing to give the higher dose nintedanib and attend.  I was able to get her some donor sample of nintedanib.  The lot number of this is [id number]  [id number].  Expiration date is November 2023.  The serial number is [id number].  The GT IN number is [id number]       ECHO 11/3120  IMPRESSIONS     1. Left ventricular ejection fraction, by estimation, is 65 to 70%. The  left ventricle has normal function. The left ventricle has no regional  wall motion abnormalities. Left ventricular diastolic parameters are  consistent with Grade I diastolic  dysfunction (impaired relaxation). Elevated left ventricular end-diastolic  pressure. The E/e' is 60. The average left ventricular global longitudinal  strain is -18.4 %.   2. Right ventricular systolic function is normal. The right ventricular  size is normal. There is mildly elevated pulmonary artery systolic  pressure. The estimated right ventricular systolic pressure is 78.9 mmHg.   3. The mitral valve is abnormal. Mild mitral valve regurgitation.   4. The aortic valve is tricuspid. Aortic valve regurgitation is not  visualized. Mild aortic valve sclerosis is present, with no evidence of  aortic valve stenosis.   5. The inferior vena cava is normal in size with greater than 50%  respiratory variability, suggesting right atrial pressure  of 3 mmHg.   Comparison(s): Changes from prior study are noted. 12/18/18 EF 60-65%. PA  pressure 40mHg.    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  OV 03/10/2021  Subjective:  Patient ID: MTamala [name] female , DOB: [birth year], age 82y.o. , MRN: [id number], [place] [id number] RShon Baton MD Patient Care Team: RShon Baton MD as PCP - General (Internal Medicine) CBuford Dresser MD as PCP - Cardiology (Cardiology)  This Provider for this visit: Treatment Team:  Attending Provider: RBrand Males MD    03/10/2021 -   Chief Complaint  Patient presents with   Follow-up    Pt states she has questions about ofev she currently on it but having side effects.     HPI MSKYLYNNE SCHLECHTER[age]y.o.  -returns for follow-up.  She presents with her husband NFestus Aloe  Last visit she wanted to increase the nintedanib to 150 mg twice daily but this is made her have more nausea and vomiting.  The diarrhea is very mild.  She then reduced her nintedanib 200 mg twice daily but still having side effects.  She is now started losing weight again.  She is really frustrated by this.  Her husband asked about pirfenidone.  Explained that the research with progressive pulmonary fibrosis is limited in the case of pirfenidone so nintedanib is the first choice.  Did agree that if nintedanib did not work we would try pirfenidone.  She wanted know what her options were.  We discussed about stopping nintedanib and improving quality of life but face the risk of continued progression pulmonary fibrosis.  She was not that enthused about this choice.  We  discussed about trying Zofran with the nintedanib.  She seemed more receptive to this idea.  We decided that we will try with 100 mg twice daily at the lower dose and then see.  She believes asthma is under control.  Her pulmonary function test shown below seems to fluctuate.  Overall symptom severity stable.  She wanted a handicap placard signed today which I did.  OV 04/08/2021  Subjective:  Patient ID: Stacey [name], female , DOB: 12/09/1940 , age 65 y.o. , MRN: [id number] , ADDRESS: [place] [address] PCP Shon Baton, MD Patient Care Team: Shon Baton, MD as PCP - General (Internal Medicine) Buford Dresser, MD as PCP - Cardiology (Cardiology)  This Provider for this visit: Treatment Team:  Attending Provider: Brand Males, MD    04/08/2021 -   Chief Complaint  Patient presents with   Follow-up    Pt states she has been doing okay since last visit and denies any real complaints. States breathing has been doing okay and states the nausea went away.     HPI JUANISHA BAUTCH 82 y.o. 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  Last seen 4 weeks ago.  At that time she was losing weight and having significant GI side effects with nintedanib full dose.  Told her to reduce the nintedanib dose to 100 mg twice daily and also take Zofran as needed.  She is taking Zofran as needed.  She is tolerating the nintedanib low-dose much better.  Symptom scores are listed below.  Overall she feels better.  Weight loss is stabilized.  Infectious gained 1 pound of weight.  She feels that she will just take with nintedanib 100 mg twice daily using Zofran as needed.  She does not want to change the schedule.  I fully agree with her and supported on that.  She is aware that the low-dose is less effective but this might be best balance between risk and benefit.  She did ask about getting a COVID by  [employer]ent mRNA booster 2 oh micron and BA.5.  According to history she has had COVID x2.  Most recently was in June 2022 which would be against the current viral strain.  In addition she is got monoclonal antibody prophylaxis euvsheld in past.  She does socially isolate herself to the extent possible and masks.  She is going to get the monoclonal antibody prophylaxis again in a few weeks when she gets her Rituxan.  We did discuss the fact that because she is on Rituxan she is immunosuppressed and does not make antibodies and then the facet antibody transfer that she gets through [facility] is risk protective.  Additional risk protective feature is the fact that she had a current strain of COVID-19 and potentially has natural immunity.  Therefore it is her call to get the bivaet booster although she wanted to avoid it and I supported this in a shared decision making she could hold off till at least January 2023 when it will be 6 months since her natural infection.     OV 07/18/2021  Subjective:  Patient ID: Stacey [name], female , DOB: June 28, 1941 , age 13 y.o. , MRN: [id number] , ADDRESS: [place] [address] PCP Shon Baton, MD Patient Care Team: Shon Baton, MD as PCP - General (Internal Medicine) Buford Dresser, MD as PCP - Cardiology (Cardiology)  This Provider for this visit: Treatment Team:  Attending Provider: Brand Males, MD      xxxx HPI Stacey [name] 82 y.o. -presents for follow-up.  Last visit September 2022.  Since then she is doing stable.  With a lower dose of nintedanib no more diarrhea.  Weight loss is stopped.  Barely any nausea.  She is not needing Zofran for nausea control.  Her main concern right now is that she is having  imbalance issues.  She is stopped working with the physical therapy.  She thinks it is 1 of proprioception but also long-term muscle loss and sarcopenia.  I have advised her to take this with her primary  care/rheumatology.  Terms of asthma is stable on Breo and Singulair.  Last liver function test April 2022 in our chart.  Latest pulmonary function test shows stability        OV 03/06/2022  Subjective:  Patient ID: Stacey [name], female , DOB: Feb 25, 1941 , age 89 y.o. , MRN: [id number] , ADDRESS: [place] [phone] PCP Shon Baton, MD Patient Care Team: Shon Baton, MD as PCP - General (Internal Medicine) Buford Dresser, MD as PCP - Cardiology (Cardiology)  This Provider for this visit: Treatment Team:  Attending Provider: Brand Males, MD    03/06/2022 -   Chief Complaint  Patient presents with   Follow-up    PFT performed today.  Pt states she has been doing okay since last visit. States her breathing has been doing okay but states she has been having some problems with balance.     HPI IZELLA YBANEZ 82 y.o. -returns for follow-up.  Last seen in January 2023.  She says that from a breathing standpoint she is stable but she has had balance issues.  She has had 2 accidental falls.  The first 1 was in March 2023 while entering the house along the steps and she landed in the bushes and had 25 stitches to her left lower extremity.  She also had collarbone fracture.  Then again a fall in the driveway 2 months ago but with just bruising.  She is now going to undergo physical therapy.  ENT evaluation did not show any ENT causes for the fall.  In terms of her dyspnea and walking desaturation test in the office she is stable but her pulmonary function test shows continued decline.  She is on nintedanib at the lower dose because she is not able to tolerate the higher dose.  On this lower dose she still requiring Zofran for control of nausea.  She takes Zofran preemptively.  There are no other issues.  The last echo and CT scan was over a year ago. PFT   OV 07/24/2022  Subjective:  Patient ID: Stacey [name], female , DOB: August 17, 1940 , age 82  y.o. , MRN: [id number] , ADDRESS: [place]  [phone] PCP Shon Baton, MD Patient Care Team: Shon Baton, MD as PCP - General (Internal Medicine) Buford Dresser, MD as PCP - Cardiology (Cardiology)  This Provider for this visit: Treatment Team:  Attending Provider: Brand Males, MD   Rheumatoid arthritis with Joint s/ILD- Rituxan/steroids [facility])  - progressive phenotype (CT 2017/2019 -> Oct 2020). Planned Ofev start in Nov 2020 but did not start as of dec 2020. LFT normal Jan 2021   - Rituxan via [facility] -September 2021 + Medrol '4mg'$ /'2mg'$  QOD baseline many decades (on '8mg'$  per day since feb 2021 due to recurrent asthma flares)  - nintedanib for her ILD she started it on July 31, 2019 - started '100mg'$  twice daily (started low dose du eto side effect concern) -> increased to '150mg'$  bid d June 2022 -> reduced to '100mg'$  bid aug 2022 due tow eight loss  - ILD PRO registry study - since 08/16/19  Athma/obstructive lung disease phenotype on Dulera/Singulair  Post herpetic neuralgia - on elavil, gabapentin, effexor  Coronary Artery Calcification - 1 vessel -  S/p - covid-19 on 05/31/2019. Admitted 06/11/2019  - 06/16/2019. Rx with Remdesviri and Decadron.  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  Following trip to [place].  Was hypoxemic.  Small Hiatal hernia on CT Oct 2020  Rib fracture from fall Dec 2020: Musculoskeletal: There is nondisplaced fractures of the posterior left fifth, 6, 8, 9, and tenth left ribs. There is unchanged slight superior compression deformities of the T5, T6, T10 vertebral bodies with less than 25% loss in height.Associated pulmnary contusion +  -On Reclast once a year through Dr. Eda Paschal  Normal ECHO Summer 2020 -> possible [facility] May 2021 RSVP 61s with GR1 ddx  -  Normal ECHO may 2022    07/24/2022  -   Chief Complaint  Patient presents with   Follow-up    Had Covid 19- home test pos mid Dec 2023- called PCP and was tx with molnupiravir. Her breathing is overall doing well. She denies any cough or wheezing.      HPI YOVANNA COGAN 82 y.o. -presents for follow-up.  Last seen in August 2023.  No pulmonary function test today.  Presents by herself.  She tells me that she and her husband got COVID-19 in December 2023.  Treated with molnupiravir and doing well.  Respiratory wise she is stable.  She is on low-dose protocol for nintedanib no weight loss she is maintained her weight.  Her symptom score is stable.  Her main issue is that she continues to have imbalance she has had 4 falls in the last 6 months.  Likely no fractures or trauma.  She did tell me she is seen ENT and the workup was normal [she did tell me that last time 2].  In the interim she is seen neurology and she is currently doing outpatient rehab.   Rheumatoid arthritis: Review of the external medical records indicate she saw Dr. Eda Paschal at [facility] 07/20/2022 Dr. Stann Mainland is continuing Rituxan and Reclast.  Safety labs for nintedanib she did have chemistry on 05/22/2022 at [facility].  This was normal.    SYMPTOM SCALE - ILD 03/27/2019  05/24/2019 (2-3 weeks pre cpovid)  08/03/2019 Post covid Post fall rib # 12/18/2019  04/10/2020 142# resp virus admit aug 2021 nos 08/01/2020 134#  10/22/2020 136# Now doing rehab 12/23/2020 136# 03/10/2021 131# Ofev '150mg'$  bid 04/08/2021 132# Ofev '100mg'$  bid 07/18/2021 133# ofev 100 x 2 03/06/2022 132#, ofev 100x2 07/24/2022 133#  O2 use ra       ra ra ra ra ra ra  Shortness of Breath 0 -> 5 scale with 5 being worst (score 6 If unable to do)              At rest '0 2 3 2 3 0 3 0 3 1 ''2 1 2  '$ Simple tasks - showers, clothes change, eating, shaving '0 3 2 3 4 1 3 2 3 1 ''3 3 3  '$ Household (dishes, doing bed, laundry) '2 4 5 3 4 3 3 3 4 3 3 4 4  '$ Shopping '2 3 6 4 4 4 4 3 4 4 4 4 '$ 3.5   Walking level at own pace '2 3 4 4 3 2 4 2 3 3 3 4 3  '$ Walking up Stairs '3 3 5 4 4 4 4 3 4 4 4 5 4  '$ Total (40 - 48) Dyspnea Score '9 17 25 '$ 20  $'22 14 21 13 21 16 19 21 20  'X$ How bad is your cough? '2 2 3 1 2 '$ 0 0 0 1 0 0 0 0  How bad is your fatigue 3 3.5 5 3.'5 3 4 3 2 3 3 4 4 3  '$ nause    0 1 1 0 1 2.'5 1 1 1 '$ 0  vomit    0 1 1 0 1 2.'4 1 1 1 '$ 0  diarrhea    '0 1 0 0 0 1 0 0 0 0 '$  anxiety    '0 2 0 1 0 1 0 0 1 0 '$  depression    '0 1 0 0 0 0 0 0 0 0 '$  0   Simple office walk  feet x  3 laps goal with forehead probe 04/12/2018  07/21/2018  05/24/2019  12/18/2019  04/10/2020  08/01/2020  10/22/2020  03/10/2021  07/18/2021  03/06/2022   O2 used Room air - off o2 x 10 min Room air  Room air ra ra ra ra ra ra ra  Number laps completed 3 x 185 feet 3 x 250 feet    r    3  Comments about pace normal normal   Slow, no walker Slow . No walker.  Slow. No walker. No cane walker    Resting Pulse Ox/HR 99% and 72/min 97% and 74/min 97% and 81/min 97% and 76/m 96% and 88/min 94% and 83 96% and HR 79 97% and HR 51 100% and 78 99% and HR 73  Final Pulse Ox/HR 98% and 95/min 96% and 96/min 94% and 107/min 93% and 94 92% and 97 90% and 100 89% and 104/min 92% and 106 97% and 102 92% and HR 105  Desaturated </= 88% no no no no no yes yes no no no  Desaturated <= 3% points no no Yes, 3 pints Ues, 4 points Yes,  4 points Yes, 4 points Yes, 7 points  Yes, 3 pints Yes, 7 poins  Got Tachycardic >/= 90/min yes yes yes yes yes yes yes   yes  Symptoms at end of test No complaint Mild dyspnea Mod dyspnea Some dyspnea dyspnea dyspnie cat 3d Dyspneic moderate  Dyspneic and wheezing Mod dyspnea Mopd dyspea  Miscellaneous comments improed from hospital Same v improved woprse same  Wobbly gait baseline Needed 2 break  Stopped x 2 to get balanced and staggered while alking     PFT     Latest Ref Rng & Units 03/06/2022    3:01 PM 07/02/2021    3:07 PM 03/07/2021   11:05 AM 12/23/2020    1:03 PM 10/22/2020    9:05 AM 09/10/2020    9:52 AM  04/08/2020    9:27 AM  PFT Results  FVC-Pre L 1.62  1.69  1.78  1.66  1.73  1.76  1.83   FVC-Predicted Pre % 61  62  66  60  63  64  66   Pre FEV1/FVC % % 74  73  72  71  76  75  74   FEV1-Pre L 1.20  1.23  1.28  1.17  1.30  1.32  1.35   FEV1-Predicted Pre % 61  61  64  57  64  64  66   DLCO uncorrected ml/min/mmHg 12.57  15.35  12.94  14.98  13.67  15.38  14.39   DLCO UNC% % 65  79  66  77  70  79  74   DLCO  corrected ml/min/mmHg 12.57  15.35  12.94  14.98  13.80  15.38  15.49   DLCO COR %Predicted % 65  79  66  77  71  79  79   DLVA Predicted % 84  100  84  86  80  102  108        has a past medical history of Abnormal finding of blood chemistry, Asthma, H/O measles, H/O varicella, Hypertension, 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[place]), 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   reports that she has never smoked. She has never used smokeless tobacco.  Past Surgical History:  Procedure Laterality Date   CHOLECYSTECTOMY  2011   IR ANGIO INTRA EXTRACRAN SEL COM CAROTID INNOMINATE BILAT MOD SED  06/18/2020   IR ANGIO VERTEBRAL SEL SUBCLAVIAN INNOMINATE UNI R MOD SED  06/18/2020   IR ANGIO VERTEBRAL SEL VERTEBRAL UNI L MOD SED  06/18/2020   WISDOM TOOTH EXTRACTION      Allergies  Allergen Reactions   Penicillins Other (See Comments)    Reaction unknown occurred during childhood Has patient had a PCN reaction causing immediate rash, facial/tongue/throat swelling, SOB or lightheadedness with hypotension: Unknown Has patient had a PCN reaction causing severe rash involving mucus membranes or skin necrosis: Unknown Has patient had a PCN reaction that required hospitalization: No Has patient had a PCN reaction occurring within the last 10 years: No If all of the above answers are "NO", then may proceed with Cephalosporin  use.  Unsur   Remicade [Infliximab] Other (See Comments)    Reaction unknown   Sulfa Antibiotics Other (See Comments)    Reaction unknown    Immunization History  Administered Date(s) Administered   Fluad Quad(high Dose 65+) 04/23/2020, 03/23/2022   Influenza Split 04/12/2013, 04/12/2014, 03/14/2015, 04/23/2016   Influenza, High Dose Seasonal PF 04/12/2017, 03/24/2018, 04/13/2019, 03/27/2021   Influenza-Unspecified 04/03/2013, 04/22/2017   Moderna Sars-Covid-2 Vaccination 08/25/2019, 09/22/2019, 03/07/2020   Pfizer Covid-19 Vaccine Bivalent Booster 57yr & up 05/16/2021   Pneumococcal Conjugate-13 04/03/2013, 09/28/2014   Pneumococcal Polysaccharide-23 07/13/2012, 07/24/2013   Respiratory Syncytial Virus Vaccine,Recomb Aduvanted(Arexvy) 04/12/2022   Td 01/11/2018   Tdap 12/10/2017    Family History  Problem Relation Age of Onset   Asthma Mother    Anemia Mother    Polymyalgia rheumatica Mother    COPD Father    Pulmonary fibrosis Father    Breast cancer Maternal Grandmother 88     Current Outpatient Medications:    amitriptyline (ELAVIL) 25 MG tablet, Take 25 mg by mouth at bedtime., Disp: , Rfl: 0   Biotin 1000 MCG tablet, Take 1,000 mcg by mouth daily., Disp: , Rfl:    cholecalciferol (VITAMIN D) 1000 UNITS tablet, Take 1,000 Units by mouth daily., Disp: , Rfl:    fluticasone furoate-vilanterol (BREO ELLIPTA) 100-25 MCG/INH AEPB, Inhale 1 puff into the lungs daily., Disp: 60 each, Rfl: 5   gabapentin (NEURONTIN) 300 MG capsule, Take 300 mg by mouth at bedtime., Disp: , Rfl:    gabapentin (NEURONTIN) 300 MG capsule, Take 300 mg by mouth daily., Disp: , Rfl:    losartan (COZAAR) 25 MG tablet, Take 50 mg by mouth daily., Disp: , Rfl:    methylPREDNISolone (MEDROL) 4 MG tablet, Take 1 tablet (4 mg total) by mouth every other day. Alternate with the '2mg'$  dose (Patient taking differently: Take 4 mg by mouth daily.), Disp:  , Rfl:  ondansetron (ZOFRAN) 4 MG tablet, Take 1  tablet (4 mg total) by mouth every 8 (eight) hours as needed for nausea or vomiting., Disp: 30 tablet, Rfl: 5   pantoprazole (PROTONIX) 40 MG tablet, Take 1 tablet (40 mg total) by mouth 2 (two) times daily before a meal., Disp: 180 tablet, Rfl: 3   polyethylene glycol (MIRALAX / GLYCOLAX) 17 g packet, Take 17 g by mouth as needed., Disp: , Rfl:    pravastatin (PRAVACHOL) 20 MG tablet, Take 1 tablet (20 mg total) by mouth daily. Please schedule appointment with Dr. Harrell Gave for refills. 1st attempt, Disp: 90 tablet, Rfl: 0   riTUXimab (RITUXAN) 100 MG/10ML injection, Inject 100 mg into the vein every 6 (six) months. , Disp: , Rfl:    sulfamethoxazole-trimethoprim (BACTRIM DS) 800-160 MG tablet, TAKE (1) TABLET ON MONDAY, WEDNESDAY AND FRIDAY., Disp: 12 tablet, Rfl: 5   venlafaxine XR (EFFEXOR-XR) 75 MG 24 hr capsule, Take 75 mg by mouth at bedtime. , Disp: , Rfl:       Objective:   Vitals:   07/24/22 1406  BP: 136/80  Pulse: 63  Temp: 98 F (36.7 C)  TempSrc: Oral  SpO2: 94%  Weight: 133 lb 9.6 oz (60.6 kg)  Height: '5\' 5"'$  (1.651 m)    Estimated body mass index is 22.23 kg/m as calculated from the following:   Height as of this encounter: '5\' 5"'$  (1.651 m).   Weight as of this encounter: 133 lb 9.6 oz (60.6 kg).  '@WEIGHTCHANGE'$ @  [employer]   07/24/22 1406  Weight: 133 lb 9.6 oz (60.6 kg)     Physical Exam    General: No distress. Frail , baseline head nod Neuro: Alert and Oriented x 3. GCS 15. Speech normal Psych: Pleasant Resp:  Barrel Chest - no.  Wheeze - no, Crackles - yes, No overt respiratory distress CVS: Normal heart sounds. Murmurs - no Ext: Stigmata of Connective Tissue Disease - RA. HAS CANE HEENT: Normal upper airway. PEERL +. No post nasal drip        Assessment:       ICD-10-CM   1. Interstitial lung disease due to connective tissue disease ([place])  J84.89    M35.9     2. Encounter for long-term current use of high risk medication  Z79.899      3. Asthma, unspecified asthma severity, unspecified whether complicated, unspecified whether persistent  J45.909     4. Balance problem  R26.89     5. Frequent falls  R29.6          Plan:     Patient Instructions     ICD-10-CM   1. Interstitial lung disease due to connective tissue disease ([place])  J84.89    M35.9     2. Encounter for long-term current use of high risk medication  Z79.899     3. Asthma, unspecified asthma severity, unspecified whether complicated, unspecified whether persistent  J45.909     4. Balance problem  R26.89     5. Frequent falls  R29.6      Interstitial lung disease due to connective tissue disease ([place]) Encounter for long-term current use of high risk medication  -lung function with continued decline aos of aug 2023 but relative clinical stability stince then - Tolerating lower dose ofev better   - no diarrhea  - weight loss stopped  - not needing zofran with lower dose  - normal LFT at [facility] NOv 2023    Plan - continue ofev  $'100mg'T$  twice daily - can take zofran '4mg'$  twice daily as needed with ofev  - -Continue Rituxan and steroids through the rheumatologist - continuie  breo 100 dosing daily once with albuterol as needed     Asthma, unspecified asthma severity, unspecified whether complicated, unspecified whether persistent  - stable  Plan  -continue breo scheduled with albuteraol as neede  Balance problem Frequent falls   - sorry to hear about your falls x 4 last  44motjs.  -glad ENT workup normal - glad you saw neuro  - glad you are doing rehab  Plan Per PCP  Followup  - spiromerty and dlco in 4 months  - 30 min  visit in 4 months but after PFT;  - symptoms score and walking desaturation test at followup  Moderate Complexity MDM NEW OFFICE  The table below is from the 2021 E/M guidelines, first released in 2021, with minor revisions added in 2023. Must meet the requirements for 2 out of 3 dimensions to qualify.     Number and complexity of problems addressed Amount and/or complexity of data reviewed Risk of complications and/or morbidity  One or more chronic illness with mild exacerbation, progression, or side effects of treatment  Two or more stable chronic illnesses  One undiagnosed new problem with uncertain prognosis  One acute illness with systemic symptoms   Acute complicated injury Must meet the requirements for 1 of 3 of the categories)  Category 1: Tests and documents, historian  Any combination of 3 of the following:  Assessment requiring an independent historian  Review of prior external records  Review of results of each unique test  Ordering of each unique test    Category 2: Interpretation of tests  Independent interpretation of a test perfromed by another physician/NPP  Category 3: Discuss management/tests  Discussion of magagement or tests with an external physician/NPP Prescription drug management  Decision regarding minor surgery with identfied patient or procedure risk factors  Decision regarding elective major surgery without identified patient or procedure risk factors  Diagnosis or treatment significantly limited by social determinants of health            SIGNATURE    Dr. MBrand Males M.D., F.C.C.P,  Pulmonary and Critical Care Medicine Staff Physician, CEastonDirector - Interstitial Lung Disease  Program  [place] [id number] Pager: [phone] If no answer or between  15:00h - 7:00h: call [phone] Telephone: [phone]  2:41 PM 07/24/2022

## 2022-07-28 ENCOUNTER — Ambulatory Visit (INDEPENDENT_AMBULATORY_CARE_PROVIDER_SITE_OTHER): Payer: Medicare Other | Admitting: Cardiology

## 2022-07-28 ENCOUNTER — Encounter (HOSPITAL_BASED_OUTPATIENT_CLINIC_OR_DEPARTMENT_OTHER): Payer: Self-pay | Admitting: Cardiology

## 2022-07-28 VITALS — BP 120/64 | HR 74 | Ht 65.0 in | Wt 134.1 lb

## 2022-07-28 DIAGNOSIS — E7800 Pure hypercholesterolemia, unspecified: Secondary | ICD-10-CM

## 2022-07-28 DIAGNOSIS — J849 Interstitial pulmonary disease, unspecified: Secondary | ICD-10-CM

## 2022-07-28 DIAGNOSIS — Z7189 Other specified counseling: Secondary | ICD-10-CM | POA: Diagnosis not present

## 2022-07-28 DIAGNOSIS — I10 Essential (primary) hypertension: Secondary | ICD-10-CM | POA: Diagnosis not present

## 2022-07-28 DIAGNOSIS — I2510 Atherosclerotic heart disease of native coronary artery without angina pectoris: Secondary | ICD-10-CM | POA: Diagnosis not present

## 2022-07-28 NOTE — Patient Instructions (Signed)
Medication Instructions:  Your physician recommends that you continue on your current medications as directed. Please refer to the Current Medication list given to you today.   *If you need a refill on your cardiac medications before your next appointment, please call your pharmacy*  Lab Work: FASTING [facility] [place] SEE DR RUSSO IF HE IS WILLING TO DO IT   If you have labs (blood work) drawn today and your tests are completely normal, you will receive your results only by: MyChart Message (if you have MyChart) OR A paper copy in the mail If you have any lab test that is abnormal or we need to change your treatment, we will call you to review the results.  Testing/Procedures: NONE  Follow-Up: At [facility], you and your health needs are our priority.  As part of our continuing mission to provide you with exceptional heart care, we have created designated Provider Care Teams.  These Care Teams include your primary Cardiologist (physician) and Advanced Practice Providers (APPs -  Physician Assistants and Nurse Practitioners) who all work together to provide you with the care you need, when you need it.  We recommend signing up for the patient portal called "MyChart".  Sign up information is provided on this After Visit Summary.  MyChart is used to connect with patients for Virtual Visits (Telemedicine).  Patients are able to view lab/test results, encounter notes, upcoming appointments, etc.  Non-urgent messages can be sent to your provider as well.   To learn more about what you can do with MyChart, go to [email or web address].    Your next appointment:   6 month(s)  Provider:   Buford Dresser, MD

## 2022-07-28 NOTE — Progress Notes (Signed)
Cardiology Office Note:    Date:  07/28/2022   ID:  Stacey [name], DOB 1941-06-26, MRN [id number]  PCP:  Shon Baton, MD  Cardiologist:  Buford Dresser, MD PhD  Referring MD: Shon Baton, MD   CC: follow up  History of Present Illness:    Stacey [name] is a 82 y.o. 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  Cardiac history: She was admitted 12/2018 for shortness of breath and found to have elevated troponins. We had an extensive conversation during her admission with shared decision making regarding the elevation of her troponin. She preferred medical management over an invasive strategy. She had a rise and fall of troponins, with peak of 2.38 (old assay), but no chest pain. Echo without wall motion abnormalities. She received >48 hours of heparin and was started on aspirin and statin. Coronary calcium was seen on her CT, and we discussed high intensity statin. However, she was concerned about myalgias and preferred pravastatin.   At her last appointment she complained of lightheadedness and worsening imbalance issues. Her PCP had changed her Losartan to 50 mg. She had not needed to use her Zofran and Senokot medications, or her inhaler.   She had a fall off of the front porch 10/08/21 striking her head and causing a LLE laceration. EMS was called and she was seen in the ED. Troponin negative. CT imaging was reassuring. On 01/01/2022 she had another fall and presented to the ED. She injured her mid back and head, and complained of right-sided thoracic pain. Imaging showed no acute fractures or dislocation. It was suspected that she had thoracic strain and soft tissue bruising. Conservative therapy was recommended.  Today, she reports developing a third COVID infection this past December which was more mild than before. She states she has recovered well at this time and her  breathing is stable. After taking Ofev for a year, she had developed nausea with each dose. She is now on Zofran for her nausea which is helping.  In clinic her blood pressure is 120/64. She states that at home her blood pressures sometimes tend to run high such as in the 160s. At those times she has been asymptomatic.   Additionally she complains of some intermittent ankle swelling, mostly in her right ankle. She tries to elevate her feet when she is able.  She denies any palpitations, chest pain, lightheadedness, headaches, syncope, orthopnea, or PND.   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   Lichen sclerosus [id number]   Asymptomatic   Low iron    Mitral valve prolapse    Osteoarthritis    Osteoporosis    Pneumonia    Post herpetic neuralgia    Rheumatoid arthritis(714.0)    Yeast infection     Past Surgical History:  Procedure Laterality Date   CHOLECYSTECTOMY  2011   IR ANGIO INTRA EXTRACRAN SEL COM CAROTID INNOMINATE BILAT MOD SED  06/18/2020   IR ANGIO VERTEBRAL SEL SUBCLAVIAN INNOMINATE UNI R MOD SED  06/18/2020   IR ANGIO VERTEBRAL SEL VERTEBRAL UNI L MOD SED  06/18/2020   WISDOM TOOTH EXTRACTION      Current Medications: Current Outpatient Medications on File  Prior to Visit  Medication Sig   amitriptyline (ELAVIL) 25 MG tablet Take 25 mg by mouth at bedtime.   Biotin 1000 MCG tablet Take 1,000 mcg by mouth daily.   cholecalciferol (VITAMIN D) 1000 UNITS tablet Take 1,000 Units by mouth daily.   fluticasone furoate-vilanterol (BREO ELLIPTA) 100-25 MCG/INH AEPB Inhale 1 puff into the lungs daily.   gabapentin (NEURONTIN) 300 MG capsule Take 300 mg by mouth at bedtime.   gabapentin (NEURONTIN) 300 MG capsule Take 300 mg by mouth daily.   losartan (COZAAR) 25 MG tablet Take 50 mg by mouth daily.    methylPREDNISolone (MEDROL) 4 MG tablet Take 1 tablet (4 mg total) by mouth every other day. Alternate with the 61m dose (Patient taking differently: Take 4 mg by mouth daily.)   ondansetron (ZOFRAN) 4 MG tablet Take 1 tablet (4 mg total) by mouth every 8 (eight) hours as needed for nausea or vomiting.   pantoprazole (PROTONIX) 40 MG tablet Take 1 tablet (40 mg total) by mouth 2 (two) times daily before a meal.   polyethylene glycol (MIRALAX / GLYCOLAX) 17 g packet Take 17 g by mouth as needed.   pravastatin (PRAVACHOL) 20 MG tablet Take 1 tablet (20 mg total) by mouth daily. Please schedule appointment with Dr. CHarrell Gavefor refills. 1st attempt   riTUXimab (RITUXAN) 100 MG/10ML injection Inject 100 mg into the vein every 6 (six) months.    sulfamethoxazole-trimethoprim (BACTRIM DS) 800-160 MG tablet TAKE (1) TABLET ON MONDAY, WEDNESDAY AND FRIDAY.   venlafaxine XR (EFFEXOR-XR) 75 MG 24 hr capsule Take 75 mg by mouth at bedtime.    No current facility-administered medications on file prior to visit.     Allergies:   Penicillins, Remicade [infliximab], and Sulfa antibiotics   Social History   Tobacco Use   Smoking status: Never   Smokeless tobacco: Never  Vaping Use   Vaping Use: Never used  Substance Use Topics   Alcohol use: No   Drug use: No    Family History: The patient's family history includes Anemia in her mother; Asthma in her mother; Breast cancer (age of onset: 871 in her maternal grandmother; COPD in her father; Polymyalgia rheumatica in her mother; Pulmonary fibrosis in her father.  ROS:   Please see the history of present illness.   (+) Intermittent right ankle swelling Additional pertinent ROS otherwise unremarkable.  EKGs/Labs/Other Studies Reviewed:    The following studies were reviewed today:  LE Venous Doppler  11/19/2021  ([facility]): Impression:  1. No deep vein thrombosis identified in the left common femoral, femoral,  and popliteal veins.  2. The  posterior tibial and peroneal veins are not visualized and  consequently cannot be assessed for thrombus on this examination.   Echo 12/10/2020:  1. Left ventricular ejection fraction, by estimation, is 65 to 70%. The  left ventricle has normal function. The left ventricle has no regional  wall motion abnormalities. Left ventricular diastolic parameters are  consistent with Grade I diastolic  dysfunction (impaired relaxation). Elevated left ventricular end-diastolic  pressure. The E/e' is 179 The average left ventricular global longitudinal  strain is -18.4 %.   2. Right ventricular systolic function is normal. The right ventricular  size is normal. There is mildly elevated pulmonary artery systolic  pressure. The estimated right ventricular systolic pressure is 3123456mmHg.   3. The mitral valve is abnormal. Mild mitral valve regurgitation.   4. The aortic valve is tricuspid. Aortic valve regurgitation is not  visualized. Mild  aortic valve sclerosis is present, with no evidence of  aortic valve stenosis.   5. The inferior vena cava is normal in size with greater than 50%  respiratory variability, suggesting right atrial pressure of 3 mmHg.   Comparison(s): Changes from prior study are noted. 12/18/18 EF 60-65%. PA  pressure 93mHg.   CT Chest 03/04/2020: COMPARISON:  07/03/2019.   FINDINGS: Cardiovascular: Aberrant right subclavian artery. Atherosclerotic calcification of the aorta, aortic valve and coronary arteries. Heart is enlarged. Small amount of pericardial fluid is likely physiologic.   Mediastinum/Nodes: No pathologically enlarged mediastinal or axillary lymph nodes. Hilar regions are difficult to definitively evaluate without IV contrast. Esophagus is unremarkable.   Lungs/Pleura: Biapical pleuroparenchymal scarring. Patchy and largely peripheral peribronchovascular ground-glass, increased from 07/03/2019. Underlying subpleural reticulation,  traction bronchiectasis/bronchiolectasis and scattered honeycombing, similar to minimally increased from 07/03/2019. Calcified granulomas. No pleural fluid. Airway is unremarkable. No air trapping.   Upper Abdomen: Visualized portions of the liver, adrenal glands, kidneys, spleen, pancreas, stomach and bowel are unremarkable with the exception of a small hiatal hernia. Cholecystectomy. Calcified upper abdominal lymph nodes.   Musculoskeletal: Degenerative changes in the spine. No worrisome lytic or sclerotic lesions.   IMPRESSION: 1. Increased patchy pulmonary parenchymal ground-glass may be due to an atypical/viral pneumonia, including due to COVID-19. 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 2. Aortic atherosclerosis (ICD10-I70.0). Coronary artery calcification.  Echo 12/18/18  1. The left ventricle has normal systolic function with an ejection fraction of 60-65%. The cavity size was normal. There is mildly increased left ventricular wall thickness. Left ventricular diastolic Doppler parameters are consistent with impaired  relaxation. No evidence of left ventricular regional wall motion abnormalities.  2. The right ventricle has normal systolic function. The cavity was normal. There is no increase in right ventricular wall thickness. Right ventricular systolic pressure normal with an estimated pressure of 27.8 mmHg.  3. The mitral valve is grossly normal. There is mild mitral annular calcification present.  4. The tricuspid valve is grossly normal. There is mild tricuspid regurgitation.  5. The aortic valve is tricuspid with moderate calcification and thickening of the noncoronary cusp.  6. The aortic root is normal in size and structure.    EKG:  EKG is personally reviewed.   07/28/2022:  NSR at 74  bpm 07/23/2021: SR with frequent PACs and PVCs 02/09/20: NSR  Recent Labs: 10/08/2021: ALT 15; BUN 13; Creatinine, Ser 0.80; Hemoglobin 12.1; Platelets 248; Potassium 4.2; Sodium 139   Recent Lipid Panel    Component Value Date/Time   CHOL 222 (H) 12/19/2018 1532   TRIG 79 06/11/2019 2338   HDL 59 12/19/2018 1532   CHOLHDL 3.8 12/19/2018 1532   VLDL 26 12/19/2018 1532   LDLCALC 137 (H) 12/19/2018 1532    Physical Exam:    VS:  BP 120/64 (BP Location: Right Arm, Patient Position: Sitting, Cuff Size: Normal)   Pulse 74   Ht 5' 5"$  (1.651 m)   Wt 134 lb 1.6 oz (60.8 kg)   BMI 22.32 kg/m     Wt Readings from Last 3 Encounters:  07/24/22 133 lb 9.6 oz (60.6 kg)  03/06/22 132 lb 12.8 oz (60.2 kg)  10/08/21 133 lb 13.1 oz (60.7 kg)   GEN: Well nourished, well developed in no acute distress HEENT: Normal, moist mucous membranes NECK: No JVD CARDIAC: regular rhythm, normal S1 and S2, no  rubs or gallops. No murmur. VASCULAR: Radial and DP pulses 2+ bilaterally. No carotid bruits RESPIRATORY:  Velcro crackles, otherwise clear to auscultation without wheezing or rhonchi.  ABDOMEN: Soft, non-tender, non-distended MUSCULOSKELETAL:  Ambulates independently SKIN: Warm and dry, right ankle swelling from bursa NEUROLOGIC:  Alert and oriented x 3. No focal neuro deficits noted. PSYCHIATRIC:  Normal affect    ASSESSMENT:    1. 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[place])   2. Pure hypercholesterolemia   3. Essential hypertension   4. Coronary artery calcification seen on CAT scan   5. Counseling on health promotion and disease prevention    PLAN:    Interstitial lung disease, followed by Dr. Chase Caller  Coronary calcium on CT: suggests she may have underlying CAD, recommend aggressive prevention -reviewed red flag warning signs that need immediate medical attention  Hypercholesterolemia:  -see prior discussions re: high intensity statins -continue pravastatin 20 mg nightly per patient  preference  -check lipids  Hypertension: well controlled today -continue losartan  Cardiac risk counseling and prevention recommendations: -recommend heart healthy/Mediterranean diet, with whole grains, fruits, vegetable, fish, lean meats, nuts, and olive oil. Limit salt. -recommend moderate walking, 3-5 times/week for 30-50 minutes each session. Aim for at least 150 minutes.week. Goal should be pace of 3 miles/hours, or walking 1.5 miles in 30 minutes -recommend avoidance of tobacco products. Avoid excess alcohol. -ASCVD risk score: The ASCVD Risk score (Arnett DK, et al., 2019) failed to calculate for the following reasons:   The 2019 ASCVD risk score is only valid for ages 80 to 40    Plan for follow up: 6 mos or sooner as needed.  Medication Adjustments/Labs and Tests Ordered: Current medicines are reviewed at length with the patient today.  Concerns regarding medicines are outlined above.   Orders Placed This Encounter  Procedures   Lipid panel   EKG 12-Lead   No orders of the defined types were placed in this encounter.  Patient Instructions  Medication Instructions:  Your physician recommends that you continue on your current medications as directed. Please refer to the Current Medication list given to you today.   *If you need a refill on your cardiac medications before your next appointment, please call your pharmacy*  Lab Work: FASTING [facility] [place] SEE DR RUSSO IF HE IS WILLING TO DO IT   If you have labs (blood work) drawn today and your tests are completely normal, you will receive your results only by: MyChart Message (if you have MyChart) OR A paper copy in the mail If you have any lab test that is abnormal or we need to change your treatment, we will call you to review the results.  Testing/Procedures: NONE  Follow-Up: At [facility], you and your health needs are our priority.  As part of our continuing mission to provide you with  exceptional heart care, we have created designated Provider Care Teams.  These Care Teams include your primary Cardiologist (physician) and Advanced Practice Providers (APPs -  Physician Assistants and Nurse Practitioners) who all work together to provide you with the care you need, when you need it.  We recommend signing up for the patient portal called "MyChart".  Sign up information is provided on this After Visit Summary.  MyChart is used to connect with patients for Virtual Visits (Telemedicine).  Patients are able to view lab/test results, encounter notes, upcoming appointments, etc.  Non-urgent messages can be sent to your provider as well.   To learn more about  what you can do with MyChart, go to [email or web address].    Your next appointment:   6 month(s)  Provider:   Buford Dresser, MD       [facility],acting as a scribe for Buford Dresser, MD.,have documented all relevant documentation on the behalf of Buford Dresser, MD,as directed by  Buford Dresser, MD while in the presence of Buford Dresser, MD.  I, Buford Dresser, MD, have reviewed all documentation for this visit. The documentation on 07/28/22 for the exam, diagnosis, procedures, and orders are all accurate and complete.   Signed, Buford Dresser, MD PhD 07/28/2022   [place]

## 2022-07-29 ENCOUNTER — Ambulatory Visit: Payer: Medicare Other

## 2022-07-29 DIAGNOSIS — M6281 Muscle weakness (generalized): Secondary | ICD-10-CM

## 2022-07-29 DIAGNOSIS — R2681 Unsteadiness on feet: Secondary | ICD-10-CM | POA: Diagnosis not present

## 2022-07-29 DIAGNOSIS — R42 Dizziness and giddiness: Secondary | ICD-10-CM | POA: Diagnosis not present

## 2022-07-29 DIAGNOSIS — R262 Difficulty in walking, not elsewhere classified: Secondary | ICD-10-CM

## 2022-07-29 DIAGNOSIS — R2689 Other abnormalities of gait and mobility: Secondary | ICD-10-CM | POA: Diagnosis not present

## 2022-07-29 NOTE — Therapy (Signed)
OUTPATIENT PHYSICAL THERAPY LOWER EXTREMITY TREATMENT NOTE   Patient Name: Stacey [name] MRN: [id number] DOB:[birth year], 82 y.o., female Today's Date: 07/29/2022  END OF SESSION:  PT End of Session - 07/29/22 1540     Visit Number 4    Number of Visits 17    Date for PT Re-Evaluation 09/09/22    Authorization Type Medicare/BCBS    Progress Note Due on Visit 10    PT Start Time 9528    PT Stop Time 1616    PT Time Calculation (min) 41 min    Equipment Utilized During Treatment Gait belt    Activity Tolerance Patient tolerated treatment well    Behavior During Therapy WFL for tasks assessed/performed             Past Medical History:  Diagnosis Date   Abnormal finding of blood chemistry    Asthma    H/O measles    H/O varicella    Hypertension    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[place])    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   Lichen sclerosus 07/14/70   Asymptomatic   Low iron    Mitral valve prolapse    Osteoarthritis    Osteoporosis    Pneumonia    Post herpetic neuralgia    Rheumatoid arthritis(714.0)    Yeast infection    Past Surgical History:  Procedure Laterality Date   CHOLECYSTECTOMY  2011   IR ANGIO INTRA EXTRACRAN SEL COM CAROTID INNOMINATE BILAT MOD SED  06/18/2020   IR ANGIO VERTEBRAL SEL SUBCLAVIAN INNOMINATE UNI R MOD SED  06/18/2020   IR ANGIO VERTEBRAL SEL VERTEBRAL UNI L MOD SED  06/18/2020   WISDOM TOOTH EXTRACTION     Patient Active Problem List   Diagnosis Date Noted   Chronic cough 06/12/2020   Constipation    CAP (community acquired pneumonia) 02/09/2020   Acute on chronic respiratory failure with hypoxia ([place]) 07/06/2019   Fall at home, initial encounter 07/06/2019   Orthostatic hypotension 07/06/2019   Rib fractures 07/04/2019   Pneumonia due to COVID-19 virus 06/11/2019   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[place]) 05/07/2019   Acute asthma exacerbation 12/18/2018   Elevated troponin 12/18/2018   Acute bronchitis 08/30/2018   Chronic respiratory  failure with hypoxia ([place]) 04/06/2018   Essential hypertension 03/24/2018   Depression 03/24/2018   Asthma exacerbation 03/23/2018   DOE (dyspnea on exertion) 03/21/2018   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 esophagitis 03/21/2018   Chronic lung disease    Hypoxia    Dyspnea 01/23/2018   ILD (interstitial lung disease) ([place]) 01/23/2018   Claw toe, acquired, 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   Restrictive lung disease 07/18/2013   Multiple pulmonary nodules/RA lung dz  05/29/2013   Cough variant asthma vs uacs  05/29/2013   Rheumatoid arthritis ([place]) 01/13/2012   Osteopenia 53/66/4403   Lichen sclerosus et atrophicus of the vulva 01/13/2012   Asthma 01/13/2012    PCP: Shon Baton, MD  REFERRING PROVIDER: Shon Baton, MD  REFERRING DIAG:  R26.81 (ICD-10-CM) - Unsteadiness on feet  Z91.81 (ICD-10-CM) - History of falling    THERAPY DIAG:  Unsteadiness on feet  Muscle weakness (generalized)  Difficulty in walking, not elsewhere classified  Rationale for Evaluation and Treatment: Rehabilitation  ONSET DATE: chronic- many falls over the the past 2 years  SUBJECTIVE:   SUBJECTIVE STATEMENT: Patient reports doing "fine".  No pain.    PERTINENT HISTORY: Frequent falls, RA, osteoporosis, Interstitial lyme disease, Lt thigh pain after shingles 20 years ago PAIN:  Are you having pain? No  PRECAUTIONS: Fall and Other: osteopenia  WEIGHT BEARING RESTRICTIONS: No  FALLS:  Has patient fallen in last 6 months? Yes. 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   PLOF: Independent and Leisure: sewing  PATIENT GOALS: reduce falls, improve gait, improve strength   NEXT MD VISIT: 2 months   OBJECTIVE:   DIAGNOSTIC FINDINGS: NA    COGNITION: Overall cognitive status: Within functional limits for tasks assessed     SENSATION: WFL    POSTURE: rounded  shoulders, forward head, and flexed trunk   PALPATION: NA  LOWER EXTREMITY MMT:  4/5 bil hip strength, 4+/5 bil knee strength, 4/5 ankle strength    FUNCTIONAL TESTS:  5 times sit to stand: 21.87with use of hands, backs of legs on chair, and uncontrolled descent   Timed up and go (TUG): 19.84 seconds with use of cane   GAIT: Distance walked: 100 Assistive device utilized: Quad cane small base Level of assistance: Modified independence Comments: scissoring gait, reduced trunk rotation   TODAY'S TREATMENT:                                                                     DATE: 07/29/22  Nustep x 5 min level  Seated clam with green loop x 20 Seated up and over hurdle x 10 each LE Sit to stand x 5 without use of hands (verbal cues regarding weight shift fwd and knees flexed- patient still with posterior cog) Static standing on balance pad x 1 min (2 losses of balance posteriorly) Marching on balance pad x 20 with cga Mini squats on balance pad x 10 with cga Mini lunge to balance pad x 10 each LE with cga Static tandem stance x 1 min each LE  TODAY'S TREATMENT:                                                                     DATE: 07/23/22  Nustep x 5 min level 1 Sit to stand x 5 without use of hands (verbal cues regarding weight shift fwd and knees flexed) Squat to mat table with balance pad x 5 Seated LAQ with 2.5 lbs x 20 both Seated march with 2.5 lbs x 20 both Seated hi ER with 2.5 lbs 2 x 10 each Seated Alternating toe taps x 20 for coordination Lateral band walks x 3 laps at back counter green loop Seated clam with green loop x 20   Rocker board x 2 min for ankle mobility Step up on 4 inch step x 10 each LE fwd and then lateral x 10 Gastroc and soleus stretches on propped rocker board x 1 min bilateral gastrocs then 5 x 10 sec each soleus  TODAY'S TREATMENT:  DATE: 07/21/22  Nustep x 5 min level  1 Sit to stand x 5 without use of hands (verbal cues regarding weight shift fwd and knees flexed) Ascending and descending steps using reciprocal gait going up and step to gait coming down.   Seated LAQ with 2 lbs x 20 both Seated march with 2 lbs x 20 both Seated hi ER with 2 lbs 2 x 10 each Seated Alternating toe taps x 20 for coordination Rocker board x 2 min for ankle mobility Gastroc and soleus stretches on propped rocker board x 1 min bilateral gastrocs then 2 x 30 sec each soleus Standing stepping fwd and back x 10 each LE   TODAY'S TREATMENT:                                                                     DATE: 07/15/22  HEP established- see below  PATIENT EDUCATION:  Education details: Access Code: [email or web address] Person educated: Patient Education method: Explanation, Demonstration, and Handouts Education comprehension: verbalized understanding and returned demonstration  HOME EXERCISE PROGRAM: Access Code: [email or web address] URL: [email or web address] Date: 07/15/2022 Prepared by: Claiborne Billings  Exercises - Seated Long Arc Quad  - 3 x daily - 7 x weekly - 2 sets - 10 reps - 5 hold - Seated March   - 3 x daily - 7 x weekly - 3 sets - 10 reps - Seated Heel Toe Raises   - 3 x daily - 7 x weekly - 2 sets - 10 reps - Sit to Stand  - 3 x daily - 7 x weekly - 1-2 sets - 5-10 reps  ASSESSMENT:  CLINICAL IMPRESSION: Stacey [name] is progressing appropriately.  She had only two posterior losses of balance on sit to stand and squat to table but responded well to vc's for fwd weight shift to correct this. She demonstrated improved heel to toe gait transitioning between tasks but continues to appear unsteady and with neurological interruptions in movement.  Patient will benefit from skilled PT to address the below impairments and improve overall function.   OBJECTIVE IMPAIRMENTS: decreased activity tolerance, decreased endurance, decreased mobility, difficulty walking, impaired flexibility,  improper body mechanics, postural dysfunction, and pain.   ACTIVITY LIMITATIONS: standing, squatting, transfers, and locomotion level  PARTICIPATION LIMITATIONS: meal prep, cleaning, shopping, community activity, and yard work  PERSONAL FACTORS: 1-2 comorbidities: frequent falls, osteoporosis , lung disease  are also affecting patient's functional outcome.   REHAB POTENTIAL: Good  CLINICAL DECISION MAKING: Evolving/moderate complexity  EVALUATION COMPLEXITY: Moderate   GOALS: Goals reviewed with patient? Yes  SHORT TERM GOALS: Target date: 08/12/2022   Be independent in initial HEP Baseline: Goal status: INITIAL  2.  Improve LE strength to perform sit to stand transition without use of legs on back of chair Baseline:  Goal status: INITIAL  3.  Perform 5x sit to stand in < or = to 17 seconds to reduce falls risk Baseline:  Goal status: INITIAL  4.  Ambulate short distances in clinic with normalized base of support and 50% reduced scissoring  Baseline:  Goal status: INITIAL   LONG TERM GOALS: Target date: 09/09/2022    Be independent in advanced HEP Baseline:  Goal status: INITIAL  2.  Perform sit to stand with min to  no use of UE support due to improved LE strength Baseline:  Goal status: INITIAL  3.  Perform TUG in < or = to 13 seconds without instability to reduce falls risk Baseline:  Goal status: INITIAL  4.  Perform 5x sit to stand in < or = to 14 seconds to reduce falls risk Baseline:  Goal status: INITIAL  5.  Report no falls at home or in the community Baseline:  Goal status: INITIAL    PLAN:  PT FREQUENCY: 2x/week  PT DURATION: 8 weeks  PLANNED INTERVENTIONS: Therapeutic exercises, Therapeutic activity, Neuromuscular re-education, Balance training, Gait training, Patient/Family education, Self Care, Joint mobilization, Aquatic Therapy, Cryotherapy, Moist heat, Manual therapy, and Re-evaluation  PLAN FOR NEXT SESSION: continue to work on LE  strength, functional mobility and gait along with balance training.    Anderson Malta B. Emary Zalar, PT 07/29/22 9:16 PM  [facility] [address], [place], [place] [address] Phone # [phone] Fax [phone]

## 2022-07-31 ENCOUNTER — Ambulatory Visit: Payer: Medicare Other

## 2022-07-31 DIAGNOSIS — R2681 Unsteadiness on feet: Secondary | ICD-10-CM | POA: Diagnosis not present

## 2022-07-31 DIAGNOSIS — M6281 Muscle weakness (generalized): Secondary | ICD-10-CM

## 2022-07-31 DIAGNOSIS — R2689 Other abnormalities of gait and mobility: Secondary | ICD-10-CM | POA: Diagnosis not present

## 2022-07-31 DIAGNOSIS — R262 Difficulty in walking, not elsewhere classified: Secondary | ICD-10-CM | POA: Diagnosis not present

## 2022-07-31 DIAGNOSIS — R42 Dizziness and giddiness: Secondary | ICD-10-CM | POA: Diagnosis not present

## 2022-07-31 NOTE — Therapy (Signed)
OUTPATIENT PHYSICAL THERAPY LOWER EXTREMITY TREATMENT NOTE   Patient Name: Stacey [name] MRN: [id number] DOB:1941/06/17, 82 y.o., female Today's Date: 07/31/2022  END OF SESSION:  PT End of Session - 07/31/22 1023     Visit Number 5    Number of Visits 17    Date for PT Re-Evaluation 09/09/22    Authorization Type Medicare/BCBS    Progress Note Due on Visit 10    PT Start Time 8338    PT Stop Time 1055    PT Time Calculation (min) 40 min    Activity Tolerance Patient tolerated treatment well    Behavior During Therapy WFL for tasks assessed/performed             Past Medical History:  Diagnosis Date   Abnormal finding of blood chemistry    Asthma    H/O measles    H/O varicella    Hypertension    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[place])    Leukoplakia of vulva 25/05/39   Lichen sclerosus 76/73/41   Asymptomatic   Low iron    Mitral valve prolapse    Osteoarthritis    Osteoporosis    Pneumonia    Post herpetic neuralgia    Rheumatoid arthritis(714.0)    Yeast infection    Past Surgical History:  Procedure Laterality Date   CHOLECYSTECTOMY  2011   IR ANGIO INTRA EXTRACRAN SEL COM CAROTID INNOMINATE BILAT MOD SED  06/18/2020   IR ANGIO VERTEBRAL SEL SUBCLAVIAN INNOMINATE UNI R MOD SED  06/18/2020   IR ANGIO VERTEBRAL SEL VERTEBRAL UNI L MOD SED  06/18/2020   WISDOM TOOTH EXTRACTION     Patient Active Problem List   Diagnosis Date Noted   Chronic cough 06/12/2020   Constipation    CAP (community acquired pneumonia) 02/09/2020   Acute on chronic respiratory failure with hypoxia ([place]) 07/06/2019   Fall at home, initial encounter 07/06/2019   Orthostatic hypotension 07/06/2019   Rib fractures 07/04/2019   Pneumonia due to COVID-19 virus 06/11/2019   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[place]) 05/07/2019   Acute asthma exacerbation 12/18/2018   Elevated troponin 12/18/2018   Acute bronchitis 08/30/2018   Chronic respiratory failure with hypoxia ([place]) 04/06/2018   Essential  hypertension 03/24/2018   Depression 03/24/2018   Asthma exacerbation 03/23/2018   DOE (dyspnea on exertion) 03/21/2018   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 esophagitis 03/21/2018   Chronic lung disease    Hypoxia    Dyspnea 01/23/2018   ILD (interstitial lung disease) ([place]) 01/23/2018   Claw toe, acquired, 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   Restrictive lung disease 07/18/2013   Multiple pulmonary nodules/RA lung dz  05/29/2013   Cough variant asthma vs uacs  05/29/2013   Rheumatoid arthritis ([place]) 01/13/2012   Osteopenia 93/79/0240   Lichen sclerosus et atrophicus of the vulva 01/13/2012   Asthma 01/13/2012    PCP: Shon Baton, MD  REFERRING PROVIDER: Shon Baton, MD  REFERRING DIAG:  R26.81 (ICD-10-CM) - Unsteadiness on feet  Z91.81 (ICD-10-CM) - History of falling    THERAPY DIAG:  Unsteadiness on feet  Muscle weakness (generalized)  Difficulty in walking, not elsewhere classified  Rationale for Evaluation and Treatment: Rehabilitation  ONSET DATE: chronic- many falls over the the past 2 years  SUBJECTIVE:   SUBJECTIVE STATEMENT: Patient reports no new issues.  States she is working on her weight shifting.     PERTINENT HISTORY: Frequent falls, RA, osteoporosis, Interstitial lyme disease, Lt thigh pain after shingles 20 years ago PAIN:  Are you having pain? No  PRECAUTIONS: Fall and Other: osteopenia  WEIGHT BEARING RESTRICTIONS: No  FALLS:  Has patient fallen in last 6 months? Yes. 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   PLOF: Independent and Leisure: sewing  PATIENT GOALS: reduce falls, improve gait, improve strength   NEXT MD VISIT: 2 months   OBJECTIVE:   DIAGNOSTIC FINDINGS: NA    COGNITION: Overall cognitive status: Within functional limits for tasks assessed     SENSATION: WFL    POSTURE: rounded shoulders,  forward head, and flexed trunk   PALPATION: NA  LOWER EXTREMITY MMT:  4/5 bil hip strength, 4+/5 bil knee strength, 4/5 ankle strength    FUNCTIONAL TESTS:  5 times sit to stand: 21.87with use of hands, backs of legs on chair, and uncontrolled descent   Timed up and go (TUG): 19.84 seconds with use of cane   GAIT: Distance walked: 100 Assistive device utilized: Quad cane small base Level of assistance: Modified independence Comments: scissoring gait, reduced trunk rotation   TODAY'S TREATMENT:                                                                     DATE: 07/31/22  Nustep x 5 min level  Seated heel and toe raises with 3 lb x 20 Seated LAQ with 3 lb x 20 both Seated up and over hurdle x 10 each LE Seated hip ER with 3 lb 2 x 10 each LE Seated clam with green loop x 20 Sit to stand x 5 without use of hands (verbal cues regarding weight shift fwd and knees flexed- patient still with posterior cog) Squats to balance pad on mat table x 10 (heavy vc's for weight shift) Toe walking x 3 laps at barre  TODAY'S TREATMENT:                                                                     DATE: 07/29/22  Nustep x 5 min level  Seated clam with green loop x 20 Seated up and over hurdle x 10 each LE Sit to stand x 5 without use of hands (verbal cues regarding weight shift fwd and knees flexed- patient still with posterior cog) Static standing on balance pad x 1 min (2 losses of balance posteriorly) Marching on balance pad x 20 with cga Mini squats on balance pad x 10 with cga Mini lunge to balance pad x 10 each LE with cga Static tandem stance x 1 min each LE  TODAY'S TREATMENT:  DATE: 07/23/22  Nustep x 5 min level 1 Sit to stand x 5 without use of hands (verbal cues regarding weight shift fwd and knees flexed) Squat to mat table with balance pad x 5 Seated LAQ with 2.5 lbs x 20 both Seated march with 2.5  lbs x 20 both Seated hi ER with 2.5 lbs 2 x 10 each Seated Alternating toe taps x 20 for coordination Lateral band walks x 3 laps at back counter green loop Seated clam with green loop x 20   Rocker board x 2 min for ankle mobility Step up on 4 inch step x 10 each LE fwd and then lateral x 10 Gastroc and soleus stretches on propped rocker board x 1 min bilateral gastrocs then 5 x 10 sec each soleus   TODAY'S TREATMENT:                                                                     DATE: 07/15/22  HEP established- see below  PATIENT EDUCATION:  Education details: Access Code: [email or web address] Person educated: Patient Education method: Explanation, Demonstration, and Handouts Education comprehension: verbalized understanding and returned demonstration  HOME EXERCISE PROGRAM: Access Code: [email or web address] URL: [email or web address] Date: 07/15/2022 Prepared by: Claiborne Billings  Exercises - Seated Long Arc Quad  - 3 x daily - 7 x weekly - 2 sets - 10 reps - 5 hold - Seated March   - 3 x daily - 7 x weekly - 3 sets - 10 reps - Seated Heel Toe Raises   - 3 x daily - 7 x weekly - 2 sets - 10 reps - Sit to Stand  - 3 x daily - 7 x weekly - 1-2 sets - 5-10 reps  ASSESSMENT:  CLINICAL IMPRESSION: Ainslee is having difficulty with heel to toe progression due to hx of surgery on right foot with pin fixation in several toes along with hammer toes on left foot.  She fatigued easily today but completed all tasks.  She will work on her heel to toe progression at home to decrease posterior sway.   Patient will benefit from skilled PT to address the below impairments and improve overall function.   OBJECTIVE IMPAIRMENTS: decreased activity tolerance, decreased endurance, decreased mobility, difficulty walking, impaired flexibility, improper body mechanics, postural dysfunction, and pain.   ACTIVITY LIMITATIONS: standing, squatting, transfers, and locomotion level  PARTICIPATION LIMITATIONS: meal prep,  cleaning, shopping, community activity, and yard work  PERSONAL FACTORS: 1-2 comorbidities: frequent falls, osteoporosis , lung disease  are also affecting patient's functional outcome.   REHAB POTENTIAL: Good  CLINICAL DECISION MAKING: Evolving/moderate complexity  EVALUATION COMPLEXITY: Moderate   GOALS: Goals reviewed with patient? Yes  SHORT TERM GOALS: Target date: 08/12/2022   Be independent in initial HEP Baseline: Goal status: INITIAL  2.  Improve LE strength to perform sit to stand transition without use of legs on back of chair Baseline:  Goal status: INITIAL  3.  Perform 5x sit to stand in < or = to 17 seconds to reduce falls risk Baseline:  Goal status: INITIAL  4.  Ambulate short distances in clinic with normalized base of support and 50% reduced scissoring  Baseline:  Goal status: INITIAL   LONG TERM GOALS: Target date: 09/09/2022  Be independent in advanced HEP Baseline:  Goal status: INITIAL  2.  Perform sit to stand with min to no use of UE support due to improved LE strength Baseline:  Goal status: INITIAL  3.  Perform TUG in < or = to 13 seconds without instability to reduce falls risk Baseline:  Goal status: INITIAL  4.  Perform 5x sit to stand in < or = to 14 seconds to reduce falls risk Baseline:  Goal status: INITIAL  5.  Report no falls at home or in the community Baseline:  Goal status: INITIAL    PLAN:  PT FREQUENCY: 2x/week  PT DURATION: 8 weeks  PLANNED INTERVENTIONS: Therapeutic exercises, Therapeutic activity, Neuromuscular re-education, Balance training, Gait training, Patient/Family education, Self Care, Joint mobilization, Aquatic Therapy, Cryotherapy, Moist heat, Manual therapy, and Re-evaluation  PLAN FOR NEXT SESSION: continue to work on LE strength, fwd weight shift onto ball of foot, functional mobility and gait along with balance training.    Anderson Malta B. Daelin Haste, PT 07/31/22 11:01 AM  [facility] [address], [place], [place] [address] Phone # [phone] Fax [phone]

## 2022-08-05 ENCOUNTER — Ambulatory Visit: Payer: Medicare Other

## 2022-08-05 DIAGNOSIS — R2681 Unsteadiness on feet: Secondary | ICD-10-CM

## 2022-08-05 DIAGNOSIS — M6281 Muscle weakness (generalized): Secondary | ICD-10-CM | POA: Diagnosis not present

## 2022-08-05 DIAGNOSIS — R42 Dizziness and giddiness: Secondary | ICD-10-CM | POA: Diagnosis not present

## 2022-08-05 DIAGNOSIS — R2689 Other abnormalities of gait and mobility: Secondary | ICD-10-CM | POA: Diagnosis not present

## 2022-08-05 DIAGNOSIS — R262 Difficulty in walking, not elsewhere classified: Secondary | ICD-10-CM | POA: Diagnosis not present

## 2022-08-05 NOTE — Therapy (Signed)
OUTPATIENT PHYSICAL THERAPY LOWER EXTREMITY TREATMENT NOTE   Patient Name: Stacey [name] MRN: [id number] DOB:06/14/1941, 82 y.o., female Today's Date: 08/05/2022  END OF SESSION:  PT End of Session - 08/05/22 1528     Visit Number 6    Number of Visits 17    Date for PT Re-Evaluation 09/09/22    Authorization Type Medicare/BCBS    Progress Note Due on Visit 10    PT Start Time 9518    PT Stop Time 1532    PT Time Calculation (min) 47 min    Activity Tolerance Patient tolerated treatment well    Behavior During Therapy WFL for tasks assessed/performed             Past Medical History:  Diagnosis Date   Abnormal finding of blood chemistry    Asthma    H/O measles    H/O varicella    Hypertension    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[place])    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   Lichen sclerosus 63/01/60   Asymptomatic   Low iron    Mitral valve prolapse    Osteoarthritis    Osteoporosis    Pneumonia    Post herpetic neuralgia    Rheumatoid arthritis(714.0)    Yeast infection    Past Surgical History:  Procedure Laterality Date   CHOLECYSTECTOMY  2011   IR ANGIO INTRA EXTRACRAN SEL COM CAROTID INNOMINATE BILAT MOD SED  06/18/2020   IR ANGIO VERTEBRAL SEL SUBCLAVIAN INNOMINATE UNI R MOD SED  06/18/2020   IR ANGIO VERTEBRAL SEL VERTEBRAL UNI L MOD SED  06/18/2020   WISDOM TOOTH EXTRACTION     Patient Active Problem List   Diagnosis Date Noted   Chronic cough 06/12/2020   Constipation    CAP (community acquired pneumonia) 02/09/2020   Acute on chronic respiratory failure with hypoxia ([place]) 07/06/2019   Fall at home, initial encounter 07/06/2019   Orthostatic hypotension 07/06/2019   Rib fractures 07/04/2019   Pneumonia due to COVID-19 virus 06/11/2019   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[place]) 05/07/2019   Acute asthma exacerbation 12/18/2018   Elevated troponin 12/18/2018   Acute bronchitis 08/30/2018   Chronic respiratory failure with hypoxia ([place]) 04/06/2018   Essential  hypertension 03/24/2018   Depression 03/24/2018   Asthma exacerbation 03/23/2018   DOE (dyspnea on exertion) 03/21/2018   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 esophagitis 03/21/2018   Chronic lung disease    Hypoxia    Dyspnea 01/23/2018   ILD (interstitial lung disease) ([place]) 01/23/2018   Claw toe, acquired, 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   Restrictive lung disease 07/18/2013   Multiple pulmonary nodules/RA lung dz  05/29/2013   Cough variant asthma vs uacs  05/29/2013   Rheumatoid arthritis ([place]) 01/13/2012   Osteopenia 10/93/2355   Lichen sclerosus et atrophicus of the vulva 01/13/2012   Asthma 01/13/2012    PCP: Shon Baton, MD  REFERRING PROVIDER: Shon Baton, MD  REFERRING DIAG:  R26.81 (ICD-10-CM) - Unsteadiness on feet  Z91.81 (ICD-10-CM) - History of falling    THERAPY DIAG:  Unsteadiness on feet  Muscle weakness (generalized)  Difficulty in walking, not elsewhere classified  Rationale for Evaluation and Treatment: Rehabilitation  ONSET DATE: chronic- many falls over the the past 2 years  SUBJECTIVE:   SUBJECTIVE STATEMENT: Patient reports no new issues.  She arrives walking on her heels primarily as we walk from lobby to treatment area.    PERTINENT HISTORY: Frequent falls, RA, osteoporosis, Interstitial lyme disease, Lt thigh pain after shingles 20 years ago PAIN:  Are you having pain? No  PRECAUTIONS: Fall and Other: osteopenia  WEIGHT BEARING RESTRICTIONS: No  FALLS:  Has patient fallen in last 6 months? Yes. 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   PLOF: Independent and Leisure: sewing  PATIENT GOALS: reduce falls, improve gait, improve strength   NEXT MD VISIT: 2 months   OBJECTIVE:   DIAGNOSTIC FINDINGS: NA    COGNITION: Overall cognitive status: Within functional limits for tasks  assessed     SENSATION: WFL    POSTURE: rounded shoulders, forward head, and flexed trunk   PALPATION: NA  LOWER EXTREMITY MMT:  4/5 bil hip strength, 4+/5 bil knee strength, 4/5 ankle strength    FUNCTIONAL TESTS:  5 times sit to stand: 21.87with use of hands, backs of legs on chair, and uncontrolled descent   Timed up and go (TUG): 19.84 seconds with use of cane   GAIT: Distance walked: 100 Assistive device utilized: Quad cane small base Level of assistance: Modified independence Comments: scissoring gait, reduced trunk rotation   TODAY'S TREATMENT:                                                                     DATE: 08/05/22  Nustep x 5 min level  Gait training with and without cane x 10 min focusing on toe off Seated heel and toe raises with 3 lb x 20 Seated LAQ with 3 lb x 20 both Seated up and over hurdle x 10 each LE Seated hip ER with 3 lb 2 x 10 each LE Sit to stand x 5 without use of hands (verbal cues regarding weight shift fwd and knees flexed- patient still with posterior cog) Toe raises standing 2 x 10 Toe walking x 3 laps at barre  TODAY'S TREATMENT:                                                                     DATE: 07/31/22  Nustep x 5 min level  Seated heel and toe raises with 3 lb x 20 Seated LAQ with 3 lb x 20 both Seated up and over hurdle x 10 each LE Seated hip ER with 3 lb 2 x 10 each LE Seated clam with green loop x 20 Sit to stand x 5 without use of hands (verbal cues regarding weight shift fwd and knees flexed- patient still with posterior cog) Squats to balance pad on mat table x 10 (heavy vc's for weight shift) Toe walking x 3 laps at barre  TODAY'S TREATMENT:  DATE: 07/29/22  Nustep x 5 min level  Seated clam with green loop x 20 Seated up and over hurdle x 10 each LE Sit to stand x 5 without use of hands (verbal cues regarding weight shift fwd and knees  flexed- patient still with posterior cog) Static standing on balance pad x 1 min (2 losses of balance posteriorly) Marching on balance pad x 20 with cga Mini squats on balance pad x 10 with cga Mini lunge to balance pad x 10 each LE with cga Static tandem stance x 1 min each LE  PATIENT EDUCATION:  Education details: Access Code: [email or web address] Person educated: Patient Education method: Explanation, Demonstration, and Handouts Education comprehension: verbalized understanding and returned demonstration  HOME EXERCISE PROGRAM: Access Code: [email or web address] URL: [email or web address] Date: 07/15/2022 Prepared by: Claiborne Billings  Exercises - Seated Long Arc Quad  - 3 x daily - 7 x weekly - 2 sets - 10 reps - 5 hold - Seated March   - 3 x daily - 7 x weekly - 3 sets - 10 reps - Seated Heel Toe Raises   - 3 x daily - 7 x weekly - 2 sets - 10 reps - Sit to Stand  - 3 x daily - 7 x weekly - 1-2 sets - 5-10 reps  ASSESSMENT:  CLINICAL IMPRESSION: Stacey [name] continues to have difficulty with heel to toe progression due to hx of surgery on right foot with pin fixation in several toes along with hammer toes on left foot.  She is able to do toe raises but struggles with toe off during walking.  She continues to fatigue easily.  She will work on her heel to toe progression at home to decrease posterior sway.   Patient will benefit from skilled PT to address the below impairments and improve overall function.   OBJECTIVE IMPAIRMENTS: decreased activity tolerance, decreased endurance, decreased mobility, difficulty walking, impaired flexibility, improper body mechanics, postural dysfunction, and pain.   ACTIVITY LIMITATIONS: standing, squatting, transfers, and locomotion level  PARTICIPATION LIMITATIONS: meal prep, cleaning, shopping, community activity, and yard work  PERSONAL FACTORS: 1-2 comorbidities: frequent falls, osteoporosis , lung disease  are also affecting patient's functional outcome.    REHAB POTENTIAL: Good  CLINICAL DECISION MAKING: Evolving/moderate complexity  EVALUATION COMPLEXITY: Moderate   GOALS: Goals reviewed with patient? Yes  SHORT TERM GOALS: Target date: 08/12/2022   Be independent in initial HEP Baseline: Goal status: INITIAL  2.  Improve LE strength to perform sit to stand transition without use of legs on back of chair Baseline:  Goal status: INITIAL  3.  Perform 5x sit to stand in < or = to 17 seconds to reduce falls risk Baseline:  Goal status: INITIAL  4.  Ambulate short distances in clinic with normalized base of support and 50% reduced scissoring  Baseline:  Goal status: INITIAL   LONG TERM GOALS: Target date: 09/09/2022    Be independent in advanced HEP Baseline:  Goal status: INITIAL  2.  Perform sit to stand with min to no use of UE support due to improved LE strength Baseline:  Goal status: INITIAL  3.  Perform TUG in < or = to 13 seconds without instability to reduce falls risk Baseline:  Goal status: INITIAL  4.  Perform 5x sit to stand in < or = to 14 seconds to reduce falls risk Baseline:  Goal status: INITIAL  5.  Report no falls at home or in the community Baseline:  Goal status: INITIAL  PLAN:  PT FREQUENCY: 2x/week  PT DURATION: 8 weeks  PLANNED INTERVENTIONS: Therapeutic exercises, Therapeutic activity, Neuromuscular re-education, Balance training, Gait training, Patient/Family education, Self Care, Joint mobilization, Aquatic Therapy, Cryotherapy, Moist heat, Manual therapy, and Re-evaluation  PLAN FOR NEXT SESSION: continue to work on LE strength, fwd weight shift onto ball of foot, functional mobility and gait along with balance training.    Anderson Malta B. Brodey Bonn, PT 08/05/22 11:34 PM  [facility] [address], [place], [place] [address] Phone # [phone] Fax [phone]

## 2022-08-07 ENCOUNTER — Ambulatory Visit: Payer: Medicare Other

## 2022-08-07 DIAGNOSIS — M6281 Muscle weakness (generalized): Secondary | ICD-10-CM

## 2022-08-07 DIAGNOSIS — R262 Difficulty in walking, not elsewhere classified: Secondary | ICD-10-CM

## 2022-08-07 DIAGNOSIS — R2689 Other abnormalities of gait and mobility: Secondary | ICD-10-CM | POA: Diagnosis not present

## 2022-08-07 DIAGNOSIS — R2681 Unsteadiness on feet: Secondary | ICD-10-CM | POA: Diagnosis not present

## 2022-08-07 DIAGNOSIS — R42 Dizziness and giddiness: Secondary | ICD-10-CM | POA: Diagnosis not present

## 2022-08-07 NOTE — Therapy (Signed)
OUTPATIENT PHYSICAL THERAPY LOWER EXTREMITY TREATMENT NOTE   Patient Name: Stacey [name] MRN: [id number] DOB:07/17/1940, 82 y.o., female Today's Date: 08/07/2022  END OF SESSION:  PT End of Session - 08/07/22 0848     Visit Number 7    Number of Visits 17    Date for PT Re-Evaluation 09/09/22    Authorization Type Medicare/BCBS    Progress Note Due on Visit 10    PT Start Time 0847    PT Stop Time 0930    PT Time Calculation (min) 43 min    Activity Tolerance Patient tolerated treatment well    Behavior During Therapy [facility] for tasks assessed/performed             Past Medical History:  Diagnosis Date   Abnormal finding of blood chemistry    Asthma    H/O measles    H/O varicella    Hypertension    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[place])    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   Lichen sclerosus 10/93/23   Asymptomatic   Low iron    Mitral valve prolapse    Osteoarthritis    Osteoporosis    Pneumonia    Post herpetic neuralgia    Rheumatoid arthritis(714.0)    Yeast infection    Past Surgical History:  Procedure Laterality Date   CHOLECYSTECTOMY  2011   IR ANGIO INTRA EXTRACRAN SEL COM CAROTID INNOMINATE BILAT MOD SED  06/18/2020   IR ANGIO VERTEBRAL SEL SUBCLAVIAN INNOMINATE UNI R MOD SED  06/18/2020   IR ANGIO VERTEBRAL SEL VERTEBRAL UNI L MOD SED  06/18/2020   WISDOM TOOTH EXTRACTION     Patient Active Problem List   Diagnosis Date Noted   Chronic cough 06/12/2020   Constipation    CAP (community acquired pneumonia) 02/09/2020   Acute on chronic respiratory failure with hypoxia ([place]) 07/06/2019   Fall at home, initial encounter 07/06/2019   Orthostatic hypotension 07/06/2019   Rib fractures 07/04/2019   Pneumonia due to COVID-19 virus 06/11/2019   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[employer]) 05/07/2019   Acute asthma exacerbation 12/18/2018   Elevated troponin 12/18/2018   Acute bronchitis 08/30/2018   Chronic respiratory failure with hypoxia ([place]) 04/06/2018   Essential  hypertension 03/24/2018   Depression 03/24/2018   Asthma exacerbation 03/23/2018   DOE (dyspnea on exertion) 03/21/2018   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 esophagitis 03/21/2018   Chronic lung disease    Hypoxia    Dyspnea 01/23/2018   ILD (interstitial lung disease) ([place]) 01/23/2018   Claw toe, acquired, 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   Restrictive lung disease 07/18/2013   Multiple pulmonary nodules/RA lung dz  05/29/2013   Cough variant asthma vs uacs  05/29/2013   Rheumatoid arthritis ([place]) 01/13/2012   Osteopenia 55/73/2202   Lichen sclerosus et atrophicus of the vulva 01/13/2012   Asthma 01/13/2012    PCP: Shon Baton, MD  REFERRING PROVIDER: Shon Baton, MD  REFERRING DIAG:  R26.81 (ICD-10-CM) - Unsteadiness on feet  Z91.81 (ICD-10-CM) - History of falling    THERAPY DIAG:  Unsteadiness on feet  Muscle weakness (generalized)  Difficulty in walking, not elsewhere classified  Rationale for Evaluation and Treatment: Rehabilitation  ONSET DATE: chronic- many falls over the the past 2 years  SUBJECTIVE:   SUBJECTIVE STATEMENT: Patient reports she has been working really hard on pushing off on her toes.  So far no pain in her toes from this.    PERTINENT HISTORY: Frequent falls, RA, osteoporosis, Interstitial lyme disease, Lt thigh pain after shingles 20 years  ago PAIN:  Are you having pain? No  PRECAUTIONS: Fall and Other: osteopenia  WEIGHT BEARING RESTRICTIONS: No  FALLS:  Has patient fallen in last 6 months? Yes. 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   PLOF: Independent and Leisure: sewing  PATIENT GOALS: reduce falls, improve gait, improve strength   NEXT MD VISIT: 2 months   OBJECTIVE:   DIAGNOSTIC FINDINGS: NA    COGNITION: Overall cognitive status: Within functional limits for tasks  assessed     SENSATION: WFL    POSTURE: rounded shoulders, forward head, and flexed trunk   PALPATION: NA  LOWER EXTREMITY MMT:  4/5 bil hip strength, 4+/5 bil knee strength, 4/5 ankle strength    FUNCTIONAL TESTS:  5 times sit to stand: 21.87with use of hands, backs of legs on chair, and uncontrolled descent   Timed up and go (TUG): 19.84 seconds with use of cane   GAIT: Distance walked: 100 Assistive device utilized: Quad cane small base Level of assistance: Modified independence Comments: scissoring gait, reduced trunk rotation   TODAY'S TREATMENT:                                                                     DATE: 08/07/22  Treadmill x 7 min level (gait training with emphasis on heel to toe gait with toe push off and wider BOS avoiding scissoring) Gait training x 7 min with and without cane x 10 min focusing on toe off Seated heel and toe raises x 20 Seated LAQ with 3 lb x 20 both Seated march with 3 lb x 20 Standing hip abduction and extension in // bars with 3 lbs 2 x 10 both SLS in // bars attempted 5 times each LE Side stepping in // bars x 5 laps Fwd/backward walking x 5 laps  TODAY'S TREATMENT:                                                                     DATE: 08/05/22  Nustep x 5 min level  Gait training with and without cane x 10 min focusing on toe off Seated heel and toe raises with 3 lb x 20 Seated LAQ with 3 lb x 20 both Seated up and over hurdle x 10 each LE Seated hip ER with 3 lb 2 x 10 each LE Sit to stand x 5 without use of hands (verbal cues regarding weight shift fwd and knees flexed- patient still with posterior cog) Toe raises standing 2 x 10 Toe walking x 3 laps at barre  TODAY'S TREATMENT:  DATE: 07/31/22  Nustep x 5 min level  Seated heel and toe raises with 3 lb x 20 Seated LAQ with 3 lb x 20 both Seated up and over hurdle x 10 each LE Seated hip ER with 3  lb 2 x 10 each LE Seated clam with green loop x 20 Sit to stand x 5 without use of hands (verbal cues regarding weight shift fwd and knees flexed- patient still with posterior cog) Squats to balance pad on mat table x 10 (heavy vc's for weight shift) Toe walking x 3 laps at barre   PATIENT EDUCATION:  Education details: Access Code: [email or web address] Person educated: Patient Education method: Explanation, Demonstration, and Handouts Education comprehension: verbalized understanding and returned demonstration  HOME EXERCISE PROGRAM: Access Code: [email or web address] URL: [email or web address] Date: 07/15/2022 Prepared by: Claiborne Billings  Exercises - Seated Long Arc Quad  - 3 x daily - 7 x weekly - 2 sets - 10 reps - 5 hold - Seated March   - 3 x daily - 7 x weekly - 3 sets - 10 reps - Seated Heel Toe Raises   - 3 x daily - 7 x weekly - 2 sets - 10 reps - Sit to Stand  - 3 x daily - 7 x weekly - 1-2 sets - 5-10 reps  ASSESSMENT:  CLINICAL IMPRESSION: Denene was able to demonstrate improved heel to toe progression and foot placement with heavy verbal cues on treadmill but upon removing UE support, she struggled to keep her balance.  Hip abduction and extension are extremely weak which may be contributing to her scissoring and short step length.  We worked heavily on hip strength today.  She completed all strengthening tasks with mod fatigue.   Patient will benefit from skilled PT to address the below impairments and improve overall function.   OBJECTIVE IMPAIRMENTS: decreased activity tolerance, decreased endurance, decreased mobility, difficulty walking, impaired flexibility, improper body mechanics, postural dysfunction, and pain.   ACTIVITY LIMITATIONS: standing, squatting, transfers, and locomotion level  PARTICIPATION LIMITATIONS: meal prep, cleaning, shopping, community activity, and yard work  PERSONAL FACTORS: 1-2 comorbidities: frequent falls, osteoporosis , lung disease  are also  affecting patient's functional outcome.   REHAB POTENTIAL: Good  CLINICAL DECISION MAKING: Evolving/moderate complexity  EVALUATION COMPLEXITY: Moderate   GOALS: Goals reviewed with patient? Yes  SHORT TERM GOALS: Target date: 08/12/2022   Be independent in initial HEP Baseline: Goal status: IN PROGRESS  2.  Improve LE strength to perform sit to stand transition without use of legs on back of chair Baseline:  Goal status: IN PROGRESS  3.  Perform 5x sit to stand in < or = to 17 seconds to reduce falls risk Baseline:  Goal status: IN PROGRESS  4.  Ambulate short distances in clinic with normalized base of support and 50% reduced scissoring  Baseline:  Goal status: IN PROGRESS   LONG TERM GOALS: Target date: 09/09/2022    Be independent in advanced HEP Baseline:  Goal status: INITIAL  2.  Perform sit to stand with min to no use of UE support due to improved LE strength Baseline:  Goal status: INITIAL  3.  Perform TUG in < or = to 13 seconds without instability to reduce falls risk Baseline:  Goal status: INITIAL  4.  Perform 5x sit to stand in < or = to 14 seconds to reduce falls risk Baseline:  Goal status: INITIAL  5.  Report no falls at home or in the community Baseline:  Goal status:  INITIAL    PLAN:  PT FREQUENCY: 2x/week  PT DURATION: 8 weeks  PLANNED INTERVENTIONS: Therapeutic exercises, Therapeutic activity, Neuromuscular re-education, Balance training, Gait training, Patient/Family education, Self Care, Joint mobilization, Aquatic Therapy, Cryotherapy, Moist heat, Manual therapy, and Re-evaluation  PLAN FOR NEXT SESSION: continue to work on LE strength, fwd weight shift onto ball of foot, functional mobility and gait along with balance training.    Anderson Malta B. Husam Hohn, PT 08/07/22 10:17 AM   [facility] [address] [place], [place] [address] Phone # [phone] Fax [phone]

## 2022-08-10 ENCOUNTER — Ambulatory Visit: Payer: Medicare Other

## 2022-08-10 DIAGNOSIS — R262 Difficulty in walking, not elsewhere classified: Secondary | ICD-10-CM | POA: Diagnosis not present

## 2022-08-10 DIAGNOSIS — R2681 Unsteadiness on feet: Secondary | ICD-10-CM

## 2022-08-10 DIAGNOSIS — M6281 Muscle weakness (generalized): Secondary | ICD-10-CM | POA: Diagnosis not present

## 2022-08-10 DIAGNOSIS — R2689 Other abnormalities of gait and mobility: Secondary | ICD-10-CM | POA: Diagnosis not present

## 2022-08-10 DIAGNOSIS — R42 Dizziness and giddiness: Secondary | ICD-10-CM | POA: Diagnosis not present

## 2022-08-10 NOTE — Therapy (Signed)
OUTPATIENT PHYSICAL THERAPY LOWER EXTREMITY TREATMENT NOTE   Patient Name: Stacey [name] MRN: [id number] DOB:Dec 05, 1940, 82 y.o., female Today's Date: 08/10/2022  END OF SESSION:  PT End of Session - 08/10/22 1532     Visit Number 8    Date for PT Re-Evaluation 09/09/22    Authorization Type Medicare/BCBS    Progress Note Due on Visit 10    PT Start Time 0973    PT Stop Time 1526    PT Time Calculation (min) 39 min    Activity Tolerance Patient tolerated treatment well    Behavior During Therapy WFL for tasks assessed/performed              Past Medical History:  Diagnosis Date   Abnormal finding of blood chemistry    Asthma    H/O measles    H/O varicella    Hypertension    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[place])    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   Lichen sclerosus 42/68/34   Asymptomatic   Low iron    Mitral valve prolapse    Osteoarthritis    Osteoporosis    Pneumonia    Post herpetic neuralgia    Rheumatoid arthritis(714.0)    Yeast infection    Past Surgical History:  Procedure Laterality Date   CHOLECYSTECTOMY  2011   IR ANGIO INTRA EXTRACRAN SEL COM CAROTID INNOMINATE BILAT MOD SED  06/18/2020   IR ANGIO VERTEBRAL SEL SUBCLAVIAN INNOMINATE UNI R MOD SED  06/18/2020   IR ANGIO VERTEBRAL SEL VERTEBRAL UNI L MOD SED  06/18/2020   WISDOM TOOTH EXTRACTION     Patient Active Problem List   Diagnosis Date Noted   Chronic cough 06/12/2020   Constipation    CAP (community acquired pneumonia) 02/09/2020   Acute on chronic respiratory failure with hypoxia ([place]) 07/06/2019   Fall at home, initial encounter 07/06/2019   Orthostatic hypotension 07/06/2019   Rib fractures 07/04/2019   Pneumonia due to COVID-19 virus 06/11/2019   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[place]) 05/07/2019   Acute asthma exacerbation 12/18/2018   Elevated troponin 12/18/2018   Acute bronchitis 08/30/2018   Chronic respiratory failure with hypoxia ([place]) 04/06/2018   Essential hypertension 03/24/2018    Depression 03/24/2018   Asthma exacerbation 03/23/2018   DOE (dyspnea on exertion) 03/21/2018   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 esophagitis 03/21/2018   Chronic lung disease    Hypoxia    Dyspnea 01/23/2018   ILD (interstitial lung disease) ([place]) 01/23/2018   Claw toe, acquired, 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   Restrictive lung disease 07/18/2013   Multiple pulmonary nodules/RA lung dz  05/29/2013   Cough variant asthma vs uacs  05/29/2013   Rheumatoid arthritis ([place]) 01/13/2012   Osteopenia 19/62/2297   Lichen sclerosus et atrophicus of the vulva 01/13/2012   Asthma 01/13/2012    PCP: Shon Baton, MD  REFERRING PROVIDER: Shon Baton, MD  REFERRING DIAG:  R26.81 (ICD-10-CM) - Unsteadiness on feet  Z91.81 (ICD-10-CM) - History of falling    THERAPY DIAG:  Unsteadiness on feet  Muscle weakness (generalized)  Difficulty in walking, not elsewhere classified  Rationale for Evaluation and Treatment: Rehabilitation  ONSET DATE: chronic- many falls over the the past 2 years  SUBJECTIVE:   SUBJECTIVE STATEMENT: I feel unsteady today.  I don't know what is going on.    PERTINENT HISTORY: Frequent falls, RA, osteoporosis, Interstitial lyme disease, Lt thigh pain after shingles 20 years ago PAIN:  Are you having pain? No  PRECAUTIONS: Fall and Other: osteopenia  WEIGHT BEARING RESTRICTIONS:  No  FALLS:  Has patient fallen in last 6 months? Yes. 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   PLOF: Independent and Leisure: sewing  PATIENT GOALS: reduce falls, improve gait, improve strength   NEXT MD VISIT: 2 months   OBJECTIVE:   DIAGNOSTIC FINDINGS: NA    COGNITION: Overall cognitive status: Within functional limits for tasks assessed     SENSATION: WFL    POSTURE: rounded shoulders, forward head, and flexed trunk    PALPATION: NA  LOWER EXTREMITY MMT:  4/5 bil hip strength, 4+/5 bil knee strength, 4/5 ankle strength    FUNCTIONAL TESTS:  At Eval: 5 times sit to stand: 21.87with use of hands, backs of legs on chair, and uncontrolled descent   Timed up and go (TUG): 19.84 seconds with use of cane  08/10/22: 5x sit to stand: 22.66 with less use of back of legs and more control with stand to sit   GAIT: Distance walked: 100 Assistive device utilized: Quad cane small base Level of assistance: Modified independence Comments: scissoring gait, reduced trunk rotation  TODAY'S TREATMENT:                                                                     DATE: 08/10/22  NuStep: Level 5 (new model) arms/legs x 6 minutes  Sit to stand with and without pad: cueing for hip abduction and eccentric control 3x5 Seated heel and toe raises x 20 Seated LAQ with 3 lb x 20 both Seated march with 3 lb x 20 Standing hip abduction and extension in // bars with 3 lbs 2 x 10 both Seated ball squeeze 2x10 with 5" hold  Seated hip abduction: red band 2x10  TODAY'S TREATMENT:                                                                     DATE: 08/07/22  Treadmill x 7 min level (gait training with emphasis on heel to toe gait with toe push off and wider BOS avoiding scissoring) Gait training x 7 min with and without cane x 10 min focusing on toe off Seated heel and toe raises x 20 Seated LAQ with 3 lb x 20 both Seated march with 3 lb x 20 Standing hip abduction and extension in // bars with 3 lbs 2 x 10 both SLS in // bars attempted 5 times each LE Side stepping in // bars x 5 laps Fwd/backward walking x 5 laps  TODAY'S TREATMENT:  DATE: 08/05/22  Nustep x 5 min level  Gait training with and without cane x 10 min focusing on toe off Seated heel and toe raises with 3 lb x 20 Seated LAQ with 3 lb x 20 both Seated up and over hurdle x 10 each  LE Seated hip ER with 3 lb 2 x 10 each LE Sit to stand x 5 without use of hands (verbal cues regarding weight shift fwd and knees flexed- patient still with posterior cog) Toe raises standing 2 x 10 Toe walking x 3 laps at barre  PATIENT EDUCATION:  Education details: Access Code: [email or web address] Person educated: Patient Education method: Explanation, Demonstration, and Handouts Education comprehension: verbalized understanding and returned demonstration  HOME EXERCISE PROGRAM: Access Code: [email or web address] URL: [email or web address] Date: 07/15/2022 Prepared by: Claiborne Billings  Exercises - Seated Long Arc Quad  - 3 x daily - 7 x weekly - 2 sets - 10 reps - 5 hold - Seated March   - 3 x daily - 7 x weekly - 3 sets - 10 reps - Seated Heel Toe Raises   - 3 x daily - 7 x weekly - 2 sets - 10 reps - Sit to Stand  - 3 x daily - 7 x weekly - 1-2 sets - 5-10 reps  ASSESSMENT:  CLINICAL IMPRESSION: Pt arrived with report of feeling unsteady and demonstrated multiple episodes of staggering gait upon entering the clinic.  Pt continues to work on hip strength and standing stability exercises to help to improve balance.  She does require supervision and tactile cues for safety and cueing.  Pt requires cueing to reduce hip adduction with exercises.  Pt performed 5x sit to stand with improved technique today.  Reduced use of back of legs on chair and improved eccentric control.  Pt does better with balance pad in chair.  Limited standing exercises due to being unstable in standing today.  Patient will benefit from skilled PT to address the below impairments and improve overall function.   OBJECTIVE IMPAIRMENTS: decreased activity tolerance, decreased endurance, decreased mobility, difficulty walking, impaired flexibility, improper body mechanics, postural dysfunction, and pain.   ACTIVITY LIMITATIONS: standing, squatting, transfers, and locomotion level  PARTICIPATION LIMITATIONS: meal prep, cleaning,  shopping, community activity, and yard work  PERSONAL FACTORS: 1-2 comorbidities: frequent falls, osteoporosis , lung disease  are also affecting patient's functional outcome.   REHAB POTENTIAL: Good  CLINICAL DECISION MAKING: Evolving/moderate complexity  EVALUATION COMPLEXITY: Moderate   GOALS: Goals reviewed with patient? Yes  SHORT TERM GOALS: Target date: 08/12/2022   Be independent in initial HEP Baseline: Goal status: MET  2.  Improve LE strength to perform sit to stand transition without use of legs on back of chair Baseline: less use of back of legs (08/10/22) Goal status: IN PROGRESS  3.  Perform 5x sit to stand in < or = to 17 seconds to reduce falls risk Baseline:  Goal status: IN PROGRESS  4.  Ambulate short distances in clinic with normalized base of support and 50% reduced scissoring  Baseline:  Goal status: IN PROGRESS   LONG TERM GOALS: Target date: 09/09/2022    Be independent in advanced HEP Baseline:  Goal status: INITIAL  2.  Perform sit to stand with min to no use of UE support due to improved LE strength Baseline:  Goal status: INITIAL  3.  Perform TUG in < or = to 13 seconds without instability to reduce falls risk Baseline:  Goal status: INITIAL  4.  Perform  5x sit to stand in < or = to 14 seconds to reduce falls risk Baseline:  Goal status: INITIAL  5.  Report no falls at home or in the community Baseline:  Goal status: INITIAL    PLAN:  PT FREQUENCY: 2x/week  PT DURATION: 8 weeks  PLANNED INTERVENTIONS: Therapeutic exercises, Therapeutic activity, Neuromuscular re-education, Balance training, Gait training, Patient/Family education, Self Care, Joint mobilization, Aquatic Therapy, Cryotherapy, Moist heat, Manual therapy, and Re-evaluation  PLAN FOR NEXT SESSION: continue to work on LE strength, fwd weight shift onto ball of foot, functional mobility and gait along with balance training.   Sigurd Sos, PT 08/10/22 3:33 PM   [facility] [address] [place], [place] [address] Phone # [phone] Fax [phone]

## 2022-08-12 ENCOUNTER — Ambulatory Visit: Payer: Medicare Other

## 2022-08-12 DIAGNOSIS — R2689 Other abnormalities of gait and mobility: Secondary | ICD-10-CM

## 2022-08-12 DIAGNOSIS — R42 Dizziness and giddiness: Secondary | ICD-10-CM | POA: Diagnosis not present

## 2022-08-12 DIAGNOSIS — R2681 Unsteadiness on feet: Secondary | ICD-10-CM | POA: Diagnosis not present

## 2022-08-12 DIAGNOSIS — R262 Difficulty in walking, not elsewhere classified: Secondary | ICD-10-CM

## 2022-08-12 DIAGNOSIS — M6281 Muscle weakness (generalized): Secondary | ICD-10-CM | POA: Diagnosis not present

## 2022-08-12 NOTE — Therapy (Signed)
OUTPATIENT PHYSICAL THERAPY LOWER EXTREMITY TREATMENT NOTE   Patient Name: Stacey [name] MRN: [id number] DOB:[birth year], 82 y.o., female Today's Date: 08/12/2022  END OF SESSION:  PT End of Session - 08/12/22 1520     Visit Number 9    Date for PT Re-Evaluation 09/09/22    Authorization Type Medicare/BCBS    Progress Note Due on Visit 10    PT Start Time 1446    PT Stop Time 1521    PT Time Calculation (min) 35 min    Activity Tolerance Patient tolerated treatment well;Patient limited by pain    Behavior During Therapy [facility] for tasks assessed/performed               Past Medical History:  Diagnosis Date   Abnormal finding of blood chemistry    Asthma    H/O measles    H/O varicella    Hypertension    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[place])    Leukoplakia of vulva 04/19/11   Lichen sclerosus 19/75/88   Asymptomatic   Low iron    Mitral valve prolapse    Osteoarthritis    Osteoporosis    Pneumonia    Post herpetic neuralgia    Rheumatoid arthritis(714.0)    Yeast infection    Past Surgical History:  Procedure Laterality Date   CHOLECYSTECTOMY  2011   IR ANGIO INTRA EXTRACRAN SEL COM CAROTID INNOMINATE BILAT MOD SED  06/18/2020   IR ANGIO VERTEBRAL SEL SUBCLAVIAN INNOMINATE UNI R MOD SED  06/18/2020   IR ANGIO VERTEBRAL SEL VERTEBRAL UNI L MOD SED  06/18/2020   WISDOM TOOTH EXTRACTION     Patient Active Problem List   Diagnosis Date Noted   Chronic cough 06/12/2020   Constipation    CAP (community acquired pneumonia) 02/09/2020   Acute on chronic respiratory failure with hypoxia ([place]) 07/06/2019   Fall at home, initial encounter 07/06/2019   Orthostatic hypotension 07/06/2019   Rib fractures 07/04/2019   Pneumonia due to COVID-19 virus 06/11/2019   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[place]) 05/07/2019   Acute asthma exacerbation 12/18/2018   Elevated troponin 12/18/2018   Acute bronchitis 08/30/2018   Chronic respiratory failure with hypoxia ([place]) 04/06/2018    Essential hypertension 03/24/2018   Depression 03/24/2018   Asthma exacerbation 03/23/2018   DOE (dyspnea on exertion) 03/21/2018   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 esophagitis 03/21/2018   Chronic lung disease    Hypoxia    Dyspnea 01/23/2018   ILD (interstitial lung disease) ([place]) 01/23/2018   Claw toe, acquired, 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   Restrictive lung disease 07/18/2013   Multiple pulmonary nodules/RA lung dz  05/29/2013   Cough variant asthma vs uacs  05/29/2013   Rheumatoid arthritis ([place]) 01/13/2012   Osteopenia 32/54/9826   Lichen sclerosus et atrophicus of the vulva 01/13/2012   Asthma 01/13/2012    PCP: Shon Baton, MD  REFERRING PROVIDER: Shon Baton, MD  REFERRING DIAG:  R26.81 (ICD-10-CM) - Unsteadiness on feet  Z91.81 (ICD-10-CM) - History of falling    THERAPY DIAG:  Unsteadiness on feet  Muscle weakness (generalized)  Difficulty in walking, not elsewhere classified  Other abnormalities of gait and mobility  Rationale for Evaluation and Treatment: Rehabilitation  ONSET DATE: chronic- many falls over the the past 2 years  SUBJECTIVE:   SUBJECTIVE STATEMENT: I am much more steady today. I don't know what happened last time I was here.   PERTINENT HISTORY: Frequent falls, RA, osteoporosis, Interstitial lyme disease, Lt thigh pain after shingles 20 years ago PAIN:  Are  you having pain? No  PRECAUTIONS: Fall and Other: osteopenia  WEIGHT BEARING RESTRICTIONS: No  FALLS:  Has patient fallen in last 6 months? Yes. 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84 seconds with use of cane  08/10/22: 5x sit to stand: 22.66 with less use of back of legs and more control with stand to sit   GAIT: Distance walked: 100 Assistive device utilized: Quad cane small base Level of assistance: Modified independence Comments: scissoring gait, reduced trunk rotation  TODAY'S TREATMENT:                                                                     DATE: 08/12/22  NuStep: Level 5 (new model) arms/legs x 6 minutes  Sit to stand with and without pad: green band around thighs to promote hip abduction 3x5 Seated heel and toe raises x 20 Seated LAQ with 3 lb x 20 both- verbal cues to reduce hip adduction. Standing march with 3 lb x 20 Standing hip abduction and extension at barre standing on balance pad with 3 lbs 2 x 10 both Forward step over hurdles at barre: step to gait with min UE support on 1 side x 3 laps, sidestepping with min bil UE support x 3 laps    TODAY'S TREATMENT:                                                                     DATE: 08/10/22  NuStep: Level 5 (new model) arms/legs x 6 minutes  Sit to stand with and without pad: cueing for hip abduction and eccentric control 3x5 Seated heel and toe raises x 20 Seated LAQ with 3 lb x 20 both Seated march with 3 lb x 20 Standing hip abduction and extension in // bars with 3 lbs 2 x 10 both Seated ball squeeze 2x10 with 5" hold  Seated hip abduction: red band 2x10  TODAY'S TREATMENT:  DATE: 08/07/22  Treadmill x 7 min level (gait training with emphasis on heel to toe gait  with toe push off and wider BOS avoiding scissoring) Gait training x 7 min with and without cane x 10 min focusing on toe off Seated heel and toe raises x 20 Seated LAQ with 3 lb x 20 both Seated march with 3 lb x 20 Standing hip abduction and extension in // bars with 3 lbs 2 x 10 both SLS in // bars attempted 5 times each LE Side stepping in // bars x 5 laps Fwd/backward walking x 5 laps   PATIENT EDUCATION:  Education details: Access Code: [email or web address] Person educated: Patient Education method: Explanation, Demonstration, and Handouts Education comprehension: verbalized understanding and returned demonstration  HOME EXERCISE PROGRAM: Access Code: [email or web address] URL: [email or web address] Date: 07/15/2022 Prepared by: Claiborne Billings  Exercises - Seated Long Arc Quad  - 3 x daily - 7 x weekly - 2 sets - 10 reps - 5 hold - Seated March   - 3 x daily - 7 x weekly - 3 sets - 10 reps - Seated Heel Toe Raises   - 3 x daily - 7 x weekly - 2 sets - 10 reps - Sit to Stand  - 3 x daily - 7 x weekly - 1-2 sets - 5-10 reps  ASSESSMENT:  CLINICAL IMPRESSION: Pt with reduced staggering today.  Pt continues to work on hip strength and standing stability exercises to help to improve balance.  She does require supervision and tactile cues for safety and cueing.  Pt requires cueing to reduce hip adduction with exercises.  Reduced use of back of legs on chair and improved eccentric control with transitions this week.  Pt able to do more in standing due to improved balance today.   Patient will benefit from skilled PT to address the below impairments and improve overall function.   OBJECTIVE IMPAIRMENTS: decreased activity tolerance, decreased endurance, decreased mobility, difficulty walking, impaired flexibility, improper body mechanics, postural dysfunction, and pain.   ACTIVITY LIMITATIONS: standing, squatting, transfers, and locomotion level  PARTICIPATION LIMITATIONS: meal prep, cleaning,  shopping, community activity, and yard work  PERSONAL FACTORS: 1-2 comorbidities: frequent falls, osteoporosis , lung disease  are also affecting patient's functional outcome.   REHAB POTENTIAL: Good  CLINICAL DECISION MAKING: Evolving/moderate complexity  EVALUATION COMPLEXITY: Moderate   GOALS: Goals reviewed with patient? Yes  SHORT TERM GOALS: Target date: 08/12/2022   Be independent in initial HEP Baseline: Goal status: MET  2.  Improve LE strength to perform sit to stand transition without use of legs on back of chair Baseline: less use of back of legs (08/10/22) Goal status: IN PROGRESS  3.  Perform 5x sit to stand in < or = to 17 seconds to reduce falls risk Baseline: 22.6 (08/10/22) Goal status: IN PROGRESS  4.  Ambulate short distances in clinic with normalized base of support and 50% reduced scissoring  Baseline: this varies but is less frequent (08/12/22) Goal status: IN PROGRESS   LONG TERM GOALS: Target date: 09/09/2022    Be independent in advanced HEP Baseline:  Goal status: INITIAL  2.  Perform sit to stand with min to no use of UE support due to improved LE strength Baseline:  Goal status: INITIAL  3.  Perform TUG in < or = to 13 seconds without instability to reduce falls risk Baseline:  Goal status: INITIAL  4.  Perform 5x sit to stand in < or = to 14 seconds to reduce  falls risk Baseline:  Goal status: INITIAL  5.  Report no falls at home or in the community Baseline:  Goal status: INITIAL    PLAN:  PT FREQUENCY: 2x/week  PT DURATION: 8 weeks  PLANNED INTERVENTIONS: Therapeutic exercises, Therapeutic activity, Neuromuscular re-education, Balance training, Gait training, Patient/Family education, Self Care, Joint mobilization, Aquatic Therapy, Cryotherapy, Moist heat, Manual therapy, and Re-evaluation  PLAN FOR NEXT SESSION: continue to work on LE strength,weight shifting,  functional mobility and gait along with balance training.    Sigurd Sos, PT 08/12/22 3:22 PM  [facility] [address] [place], [place] [address] Phone # [phone] Fax [phone]

## 2022-08-18 ENCOUNTER — Ambulatory Visit: Payer: Medicare Other | Attending: Otolaryngology

## 2022-08-18 DIAGNOSIS — R293 Abnormal posture: Secondary | ICD-10-CM | POA: Diagnosis not present

## 2022-08-18 DIAGNOSIS — R2681 Unsteadiness on feet: Secondary | ICD-10-CM | POA: Diagnosis not present

## 2022-08-18 DIAGNOSIS — M6281 Muscle weakness (generalized): Secondary | ICD-10-CM | POA: Insufficient documentation

## 2022-08-18 DIAGNOSIS — R252 Cramp and spasm: Secondary | ICD-10-CM | POA: Insufficient documentation

## 2022-08-18 DIAGNOSIS — R262 Difficulty in walking, not elsewhere classified: Secondary | ICD-10-CM | POA: Insufficient documentation

## 2022-08-18 NOTE — Therapy (Signed)
OUTPATIENT PHYSICAL THERAPY LOWER EXTREMITY TREATMENT NOTE   Patient Name: Stacey [name] MRN: [id number] DOB:[birth year], 82 y.o., female Today's Date: 08/18/2022  END OF SESSION:  PT End of Session - 08/18/22 1445     Visit Number 10    Number of Visits 17    Date for PT Re-Evaluation 09/09/22    Authorization Type Medicare/BCBS    Progress Note Due on Visit 20    PT Start Time 6222    PT Stop Time 1530    PT Time Calculation (min) 45 min    Activity Tolerance Patient tolerated treatment well;Patient limited by pain    Behavior During Therapy [facility] for tasks assessed/performed               Past Medical History:  Diagnosis Date   Abnormal finding of blood chemistry    Asthma    H/O measles    H/O varicella    Hypertension    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[place])    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   Lichen sclerosus 11/94/17   Asymptomatic   Low iron    Mitral valve prolapse    Osteoarthritis    Osteoporosis    Pneumonia    Post herpetic neuralgia    Rheumatoid arthritis(714.0)    Yeast infection    Past Surgical History:  Procedure Laterality Date   CHOLECYSTECTOMY  2011   IR ANGIO INTRA EXTRACRAN SEL COM CAROTID INNOMINATE BILAT MOD SED  06/18/2020   IR ANGIO VERTEBRAL SEL SUBCLAVIAN INNOMINATE UNI R MOD SED  06/18/2020   IR ANGIO VERTEBRAL SEL VERTEBRAL UNI L MOD SED  06/18/2020   WISDOM TOOTH EXTRACTION     Patient Active Problem List   Diagnosis Date Noted   Chronic cough 06/12/2020   Constipation    CAP (community acquired pneumonia) 02/09/2020   Acute on chronic respiratory failure with hypoxia ([place]) 07/06/2019   Fall at home, initial encounter 07/06/2019   Orthostatic hypotension 07/06/2019   Rib fractures 07/04/2019   Pneumonia due to COVID-19 virus 06/11/2019   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[place]) 05/07/2019   Acute asthma exacerbation 12/18/2018   Elevated troponin 12/18/2018   Acute bronchitis 08/30/2018   Chronic respiratory failure with hypoxia  ([place]) 04/06/2018   Essential hypertension 03/24/2018   Depression 03/24/2018   Asthma exacerbation 03/23/2018   DOE (dyspnea on exertion) 03/21/2018   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 esophagitis 03/21/2018   Chronic lung disease    Hypoxia    Dyspnea 01/23/2018   ILD (interstitial lung disease) ([place]) 01/23/2018   Claw toe, acquired, 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   Restrictive lung disease 07/18/2013   Multiple pulmonary nodules/RA lung dz  05/29/2013   Cough variant asthma vs uacs  05/29/2013   Rheumatoid arthritis ([place]) 01/13/2012   Osteopenia 40/81/4481   Lichen sclerosus et atrophicus of the vulva 01/13/2012   Asthma 01/13/2012    PCP: Shon Baton, MD  REFERRING PROVIDER: Shon Baton, MD  REFERRING DIAG:  R26.81 (ICD-10-CM) - Unsteadiness on feet  Z91.81 (ICD-10-CM) - History of falling    THERAPY DIAG:  Unsteadiness on feet  Muscle weakness (generalized)  Difficulty in walking, not elsewhere classified  Rationale for Evaluation and Treatment: Rehabilitation  ONSET DATE: chronic- many falls over the the past 2 years  SUBJECTIVE:   SUBJECTIVE STATEMENT: I am much more steady today. I don't know what happened last time I was here.   PERTINENT HISTORY: Frequent falls, RA, osteoporosis, Interstitial lyme disease, Lt thigh pain after shingles 20 years ago PAIN:  Are  you having pain? No  PRECAUTIONS: Fall and Other: osteopenia  WEIGHT BEARING RESTRICTIONS: No  FALLS:  Has patient fallen in last 6 months? Yes. 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   PLOF: Independent and Leisure: sewing  PATIENT GOALS: reduce falls, improve gait, improve strength   NEXT MD VISIT: 2 months   OBJECTIVE:   DIAGNOSTIC FINDINGS: NA    COGNITION: Overall cognitive status: Within functional limits for tasks  assessed     SENSATION: WFL    POSTURE: rounded shoulders, forward head, and flexed trunk   PALPATION: NA  LOWER EXTREMITY MMT:  4/5 bil hip strength, 4+/5 bil knee strength, 4/5 ankle strength    FUNCTIONAL TESTS:  At Eval:            5 times sit to stand: 21.87with use of hands, backs of legs on chair, and uncontrolled descent   Timed up and go (TUG): 19.84 seconds with use of cane   08/10/22: 5x sit to stand: 22.66 with less use of back of legs and more control with stand to sit  08/18/22: 5x sit to stand: 18.16 sec with slight use of hands on knees (more for balance)   TUG: 13.34 sec   GAIT: Distance walked: 100 Assistive device utilized: Quad cane small base Level of assistance: Modified independence Comments: scissoring gait, reduced trunk rotation  TODAY'S TREATMENT:                                                                     DATE: 08/18/22  NuStep: Level 5 (new model) arms/legs x 6 minutes  Alternating step tap ups fwd x 20 with UE support then x 20 without UE support Lateral step tap ups 2 x 10 each side without UE support 10th visit progress note completed Sit to stand x 10 practicing weight shift Remaining time spent discussing and educating patient on the nervous system and how it affects her balance along with the fact that she is unable to use her forefoot and toes to assist with proper gait and weight shift.  She inquired about personal trainer for when she has completed her formal PT. Researched contacts for her.  Discussed proper ankle and arch support in her shoes and how lack of this will also contribute to her issues.    TODAY'S TREATMENT:                                                                     DATE: 08/12/22  NuStep: Level 5 (new model) arms/legs x 6 minutes  Sit to stand with and without pad: green band around thighs to promote hip abduction 3x5 Seated heel and toe raises x 20 Seated LAQ with 3 lb x 20 both- verbal cues to reduce hip  adduction. Standing march with 3 lb x 20 Standing hip abduction and extension at barre standing on balance  pad with 3 lbs 2 x 10 both Forward step over hurdles at barre: step to gait with min UE support on 1 side x 3 laps, sidestepping with min bil UE support x 3 laps    TODAY'S TREATMENT:                                                                     DATE: 08/10/22  NuStep: Level 5 (new model) arms/legs x 6 minutes  Sit to stand with and without pad: cueing for hip abduction and eccentric control 3x5 Seated heel and toe raises x 20 Seated LAQ with 3 lb x 20 both Seated march with 3 lb x 20 Standing hip abduction and extension in // bars with 3 lbs 2 x 10 both Seated ball squeeze 2x10 with 5" hold  Seated hip abduction: red band 2x10    PATIENT EDUCATION:  Education details: Access Code: [email or web address] Person educated: Patient Education method: Explanation, Demonstration, and Handouts Education comprehension: verbalized understanding and returned demonstration  HOME EXERCISE PROGRAM: Access Code: [email or web address] URL: [email or web address] Date: 07/15/2022 Prepared by: Claiborne Billings  Exercises - Seated Long Arc Quad  - 3 x daily - 7 x weekly - 2 sets - 10 reps - 5 hold - Seated March   - 3 x daily - 7 x weekly - 3 sets - 10 reps - Seated Heel Toe Raises   - 3 x daily - 7 x weekly - 2 sets - 10 reps - Sit to Stand  - 3 x daily - 7 x weekly - 1-2 sets - 5-10 reps  ASSESSMENT:  CLINICAL IMPRESSION: Valinda demonstrates excellent improvements with functional scores.  She continues to ambulate with minimal toe off which causes her to stagger at times.  She is able to do sit to stand without use of hands without posterior sway and back of legs depending on sitting surface.  She comments that she feels "stronger" in her legs.    Patient will benefit from skilled PT to address the below impairments and improve overall function.   OBJECTIVE IMPAIRMENTS: decreased activity tolerance,  decreased endurance, decreased mobility, difficulty walking, impaired flexibility, improper body mechanics, postural dysfunction, and pain.   ACTIVITY LIMITATIONS: standing, squatting, transfers, and locomotion level  PARTICIPATION LIMITATIONS: meal prep, cleaning, shopping, community activity, and yard work  PERSONAL FACTORS: 1-2 comorbidities: frequent falls, osteoporosis , lung disease  are also affecting patient's functional outcome.   REHAB POTENTIAL: Good  CLINICAL DECISION MAKING: Evolving/moderate complexity  EVALUATION COMPLEXITY: Moderate   GOALS: Goals reviewed with patient? Yes  SHORT TERM GOALS: Target date: 08/12/2022   Be independent in initial HEP Baseline: Goal status: MET  2.  Improve LE strength to perform sit to stand transition without use of legs on back of chair Baseline: less use of back of legs (08/10/22) Goal status: IN PROGRESS  3.  Perform 5x sit to stand in < or = to 17 seconds to reduce falls risk Baseline: 22.6 (08/10/22) Goal status: IN PROGRESS  4.  Ambulate short distances in clinic with normalized base of support and 50% reduced scissoring  Baseline: this varies but is less frequent (08/12/22) Goal status: IN PROGRESS   LONG TERM GOALS: Target date: 09/09/2022    Be independent in advanced  HEP Baseline:  Goal status: MET  2.  Perform sit to stand with min to no use of UE support due to improved LE strength Baseline:  Goal status: MET  3.  Perform TUG in < or = to 13 seconds without instability to reduce falls risk Baseline:  Goal status: IN PROGRESS  4.  Perform 5x sit to stand in < or = to 14 seconds to reduce falls risk Baseline:  Goal status: IN PROGRESS  5.  Report no falls at home or in the community Baseline:  Goal status: IN PROGRESS    PLAN:  PT FREQUENCY: 2x/week  PT DURATION: 8 weeks  PLANNED INTERVENTIONS: Therapeutic exercises, Therapeutic activity, Neuromuscular re-education, Balance training, Gait  training, Patient/Family education, Self Care, Joint mobilization, Aquatic Therapy, Cryotherapy, Moist heat, Manual therapy, and Re-evaluation  PLAN FOR NEXT SESSION: continue to work on LE strength,weight shifting,  functional mobility and gait along with balance training.    Anderson Malta B. Arnella Pralle, PT 08/18/22 4:03 PM   [facility] [address] [place], [place] [address] Phone # [phone] Fax [phone]

## 2022-08-21 ENCOUNTER — Ambulatory Visit: Payer: Medicare Other

## 2022-08-21 DIAGNOSIS — M6281 Muscle weakness (generalized): Secondary | ICD-10-CM | POA: Diagnosis not present

## 2022-08-21 DIAGNOSIS — R252 Cramp and spasm: Secondary | ICD-10-CM

## 2022-08-21 DIAGNOSIS — R293 Abnormal posture: Secondary | ICD-10-CM | POA: Diagnosis not present

## 2022-08-21 DIAGNOSIS — R2681 Unsteadiness on feet: Secondary | ICD-10-CM | POA: Diagnosis not present

## 2022-08-21 DIAGNOSIS — R262 Difficulty in walking, not elsewhere classified: Secondary | ICD-10-CM

## 2022-08-21 NOTE — Therapy (Signed)
OUTPATIENT PHYSICAL THERAPY LOWER EXTREMITY TREATMENT NOTE   Patient Name: Stacey [name] MRN: [id number] DOB:09-19-1940, 82 y.o., female Today's Date: 08/21/2022  END OF SESSION:  PT End of Session - 08/21/22 0858     Visit Number 11    Number of Visits 17    Date for PT Re-Evaluation 09/09/22    Authorization Type Medicare/BCBS    Progress Note Due on Visit 20    PT Start Time 0845    PT Stop Time 0932    PT Time Calculation (min) 47 min    Activity Tolerance Patient tolerated treatment well;Patient limited by pain    Behavior During Therapy [facility] for tasks assessed/performed               Past Medical History:  Diagnosis Date   Abnormal finding of blood chemistry    Asthma    H/O measles    H/O varicella    Hypertension    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[place])    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   Lichen sclerosus [id number]   Asymptomatic   Low iron    Mitral valve prolapse    Osteoarthritis    Osteoporosis    Pneumonia    Post herpetic neuralgia    Rheumatoid arthritis(714.0)    Yeast infection    Past Surgical History:  Procedure Laterality Date   CHOLECYSTECTOMY  2011   IR ANGIO INTRA EXTRACRAN SEL COM CAROTID INNOMINATE BILAT MOD SED  06/18/2020   IR ANGIO VERTEBRAL SEL SUBCLAVIAN INNOMINATE UNI R MOD SED  06/18/2020   IR ANGIO VERTEBRAL SEL VERTEBRAL UNI L MOD SED  06/18/2020   WISDOM TOOTH EXTRACTION     Patient Active Problem List   Diagnosis Date Noted   Chronic cough 06/12/2020   Constipation    CAP (community acquired pneumonia) 02/09/2020   Acute on chronic respiratory failure with hypoxia ([place]) 07/06/2019   Fall at home, initial encounter 07/06/2019   Orthostatic hypotension 07/06/2019   Rib fractures 07/04/2019   Pneumonia due to COVID-19 virus 06/11/2019   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[place]) 05/07/2019   Acute asthma exacerbation 12/18/2018   Elevated troponin 12/18/2018   Acute bronchitis 08/30/2018   Chronic respiratory failure with hypoxia  ([place]) 04/06/2018   Essential hypertension 03/24/2018   Depression 03/24/2018   Asthma exacerbation 03/23/2018   DOE (dyspnea on exertion) 03/21/2018   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 esophagitis 03/21/2018   Chronic lung disease    Hypoxia    Dyspnea 01/23/2018   ILD (interstitial lung disease) ([place]) 01/23/2018   Claw toe, acquired, 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   Restrictive lung disease 07/18/2013   Multiple pulmonary nodules/RA lung dz  05/29/2013   Cough variant asthma vs uacs  05/29/2013   Rheumatoid arthritis ([place]) 01/13/2012   Osteopenia [id number]   Lichen sclerosus et atrophicus of the vulva 01/13/2012   Asthma 01/13/2012    PCP: Shon Baton, MD  REFERRING PROVIDER: Shon Baton, MD  REFERRING DIAG:  R26.81 (ICD-10-CM) - Unsteadiness on feet  Z91.81 (ICD-10-CM) - History of falling    THERAPY DIAG:  Unsteadiness on feet  Muscle weakness (generalized)  Difficulty in walking, not elsewhere classified  Cramp and spasm  Rationale for Evaluation and Treatment: Rehabilitation  ONSET DATE: chronic- many falls over the the past 2 years  SUBJECTIVE:   SUBJECTIVE STATEMENT: I am much more steady today. I don't know what happened last time I was here.   PERTINENT HISTORY: Frequent falls, RA, osteoporosis, Interstitial lyme disease, Lt thigh pain after shingles 20 years  ago PAIN:  Are you having pain? No  PRECAUTIONS: Fall and Other: osteopenia  WEIGHT BEARING RESTRICTIONS: No  FALLS:  Has patient fallen in last 6 months? Yes. 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   PLOF: Independent and Leisure: sewing  PATIENT GOALS: reduce falls, improve gait, improve strength   NEXT MD VISIT: 2 months   OBJECTIVE:   DIAGNOSTIC FINDINGS: NA    COGNITION: Overall cognitive status: Within functional limits for tasks  assessed     SENSATION: WFL    POSTURE: rounded shoulders, forward head, and flexed trunk   PALPATION: NA  LOWER EXTREMITY MMT:  4/5 bil hip strength, 4+/5 bil knee strength, 4/5 ankle strength    FUNCTIONAL TESTS:  At Eval:            5 times sit to stand: 21.87with use of hands, backs of legs on chair, and uncontrolled descent   Timed up and go (TUG): 19.84 seconds with use of cane   08/10/22: 5x sit to stand: 22.66 with less use of back of legs and more control with stand to sit  08/18/22: 5x sit to stand: 18.16 sec with slight use of hands on knees (more for balance)   TUG: 13.34 sec   GAIT: Distance walked: 100 Assistive device utilized: Quad cane small base Level of assistance: Modified independence Comments: scissoring gait, reduced trunk rotation  TODAY'S TREATMENT:                                                                     DATE: 08/21/22  NuStep: Level 5 (new model) arms/legs x 6 minutes  Supine hamstring stretch 3 x 30 sec both Supine IT band stretch 3 x 30 sec both Seated piriformis stretch 3 x 30 sec  Ice x 10 min to left hip seated with pillow and strap in chair with arms.      TODAY'S TREATMENT:                                                                     DATE: 08/18/22  NuStep: Level 5 (new model) arms/legs x 6 minutes  Alternating step tap ups fwd x 20 with UE support then x 20 without UE support Lateral step tap ups 2 x 10 each side without UE support 10th visit progress note completed Sit to stand x 10 practicing weight shift Remaining time spent discussing and educating patient on the nervous system and how it affects her balance along with the fact that she is unable to use her forefoot and toes to assist with proper gait and weight shift.  She inquired about personal trainer for when she has completed her formal PT. Researched contacts for her.  Discussed proper ankle and arch support in her shoes and how lack of this will also contribute to  her issues.    TODAY'S TREATMENT:  DATE: 08/12/22  NuStep: Level 5 (new model) arms/legs x 6 minutes  Sit to stand with and without pad: green band around thighs to promote hip abduction 3x5 Seated heel and toe raises x 20 Seated LAQ with 3 lb x 20 both- verbal cues to reduce hip adduction. Standing march with 3 lb x 20 Standing hip abduction and extension at barre standing on balance pad with 3 lbs 2 x 10 both Forward step over hurdles at barre: step to gait with min UE support on 1 side x 3 laps, sidestepping with min bil UE support x 3 laps    TODAY'S TREATMENT:                                                                     DATE: 08/10/22  NuStep: Level 5 (new model) arms/legs x 6 minutes  Sit to stand with and without pad: cueing for hip abduction and eccentric control 3x5 Seated heel and toe raises x 20 Seated LAQ with 3 lb x 20 both Seated march with 3 lb x 20 Standing hip abduction and extension in // bars with 3 lbs 2 x 10 both Seated ball squeeze 2x10 with 5" hold  Seated hip abduction: red band 2x10    PATIENT EDUCATION:  Education details: Access Code: [email or web address] Person educated: Patient Education method: Explanation, Demonstration, and Handouts Education comprehension: verbalized understanding and returned demonstration  HOME EXERCISE PROGRAM: Access Code: [email or web address] URL: [email or web address] Date: 07/15/2022 Prepared by: Claiborne Billings  Exercises - Seated Long Arc Quad  - 3 x daily - 7 x weekly - 2 sets - 10 reps - 5 hold - Seated March   - 3 x daily - 7 x weekly - 3 sets - 10 reps - Seated Heel Toe Raises   - 3 x daily - 7 x weekly - 2 sets - 10 reps - Sit to Stand  - 3 x daily - 7 x weekly - 1-2 sets - 5-10 reps  ASSESSMENT:  CLINICAL IMPRESSION: Goddess was having some left hip pain today.  Symptoms consistent with bursitis.  This was significantly affecting her gait, therefore we  focused on stretching and did ice at end of treatment.  She responded well to this.  We will proceed based on symptoms next visit.    Patient will benefit from skilled PT to address the below impairments and improve overall function.   OBJECTIVE IMPAIRMENTS: decreased activity tolerance, decreased endurance, decreased mobility, difficulty walking, impaired flexibility, improper body mechanics, postural dysfunction, and pain.   ACTIVITY LIMITATIONS: standing, squatting, transfers, and locomotion level  PARTICIPATION LIMITATIONS: meal prep, cleaning, shopping, community activity, and yard work  PERSONAL FACTORS: 1-2 comorbidities: frequent falls, osteoporosis , lung disease  are also affecting patient's functional outcome.   REHAB POTENTIAL: Good  CLINICAL DECISION MAKING: Evolving/moderate complexity  EVALUATION COMPLEXITY: Moderate   GOALS: Goals reviewed with patient? Yes  SHORT TERM GOALS: Target date: 08/12/2022   Be independent in initial HEP Baseline: Goal status: MET  2.  Improve LE strength to perform sit to stand transition without use of legs on back of chair Baseline: less use of back of legs (08/10/22) Goal status: IN PROGRESS  3.  Perform 5x sit to stand in < or = to 17  seconds to reduce falls risk Baseline: 22.6 (08/10/22) Goal status: IN PROGRESS  4.  Ambulate short distances in clinic with normalized base of support and 50% reduced scissoring  Baseline: this varies but is less frequent (08/12/22) Goal status: IN PROGRESS   LONG TERM GOALS: Target date: 09/09/2022    Be independent in advanced HEP Baseline:  Goal status: MET  2.  Perform sit to stand with min to no use of UE support due to improved LE strength Baseline:  Goal status: MET  3.  Perform TUG in < or = to 13 seconds without instability to reduce falls risk Baseline:  Goal status: IN PROGRESS  4.  Perform 5x sit to stand in < or = to 14 seconds to reduce falls risk Baseline:  Goal status: IN  PROGRESS  5.  Report no falls at home or in the community Baseline:  Goal status: IN PROGRESS    PLAN:  PT FREQUENCY: 2x/week  PT DURATION: 8 weeks  PLANNED INTERVENTIONS: Therapeutic exercises, Therapeutic activity, Neuromuscular re-education, Balance training, Gait training, Patient/Family education, Self Care, Joint mobilization, Aquatic Therapy, Cryotherapy, Moist heat, Manual therapy, and Re-evaluation  PLAN FOR NEXT SESSION: continue to work on LE strength,weight shifting,  functional mobility and gait along with balance training.    Anderson Malta B. Deissy Guilbert, PT 08/21/22 9:33 AM  [facility] [address] [place], [place] [address] Phone # [phone] Fax [phone]

## 2022-08-25 ENCOUNTER — Ambulatory Visit: Payer: Medicare Other

## 2022-08-25 DIAGNOSIS — R262 Difficulty in walking, not elsewhere classified: Secondary | ICD-10-CM | POA: Diagnosis not present

## 2022-08-25 DIAGNOSIS — R2681 Unsteadiness on feet: Secondary | ICD-10-CM

## 2022-08-25 DIAGNOSIS — R293 Abnormal posture: Secondary | ICD-10-CM | POA: Diagnosis not present

## 2022-08-25 DIAGNOSIS — M6281 Muscle weakness (generalized): Secondary | ICD-10-CM

## 2022-08-25 DIAGNOSIS — R252 Cramp and spasm: Secondary | ICD-10-CM | POA: Diagnosis not present

## 2022-08-25 NOTE — Therapy (Signed)
OUTPATIENT PHYSICAL THERAPY LOWER EXTREMITY TREATMENT NOTE   Patient Name: Stacey [name] MRN: [id number] DOB:Nov 30, 1940, 82 y.o., female Today's Date: 08/25/2022  END OF SESSION:  PT End of Session - 08/25/22 1236     Visit Number 12    Number of Visits 17    Date for PT Re-Evaluation 09/09/22    Authorization Type Medicare/BCBS    Progress Note Due on Visit 20    PT Start Time 1232    PT Stop Time 1315    PT Time Calculation (min) 43 min    Activity Tolerance Patient tolerated treatment well;Patient limited by pain    Behavior During Therapy [facility] for tasks assessed/performed               Past Medical History:  Diagnosis Date   Abnormal finding of blood chemistry    Asthma    H/O measles    H/O varicella    Hypertension    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[place])    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   Lichen sclerosus [id number]   Asymptomatic   Low iron    Mitral valve prolapse    Osteoarthritis    Osteoporosis    Pneumonia    Post herpetic neuralgia    Rheumatoid arthritis(714.0)    Yeast infection    Past Surgical History:  Procedure Laterality Date   CHOLECYSTECTOMY  2011   IR ANGIO INTRA EXTRACRAN SEL COM CAROTID INNOMINATE BILAT MOD SED  06/18/2020   IR ANGIO VERTEBRAL SEL SUBCLAVIAN INNOMINATE UNI R MOD SED  06/18/2020   IR ANGIO VERTEBRAL SEL VERTEBRAL UNI L MOD SED  06/18/2020   WISDOM TOOTH EXTRACTION     Patient Active Problem List   Diagnosis Date Noted   Chronic cough 06/12/2020   Constipation    CAP (community acquired pneumonia) 02/09/2020   Acute on chronic respiratory failure with hypoxia ([place]) 07/06/2019   Fall at home, initial encounter 07/06/2019   Orthostatic hypotension 07/06/2019   Rib fractures 07/04/2019   Pneumonia due to COVID-19 virus 06/11/2019   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[place]) 05/07/2019   Acute asthma exacerbation 12/18/2018   Elevated troponin 12/18/2018   Acute bronchitis 08/30/2018   Chronic respiratory failure with hypoxia  ([place]) 04/06/2018   Essential hypertension 03/24/2018   Depression 03/24/2018   Asthma exacerbation 03/23/2018   DOE (dyspnea on exertion) 03/21/2018   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 esophagitis 03/21/2018   Chronic lung disease    Hypoxia    Dyspnea 01/23/2018   ILD (interstitial lung disease) ([place]) 01/23/2018   Claw toe, acquired, 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   Restrictive lung disease 07/18/2013   Multiple pulmonary nodules/RA lung dz  05/29/2013   Cough variant asthma vs uacs  05/29/2013   Rheumatoid arthritis ([place]) 01/13/2012   Osteopenia 123XX123   Lichen sclerosus et atrophicus of the vulva 01/13/2012   Asthma 01/13/2012    PCP: Shon Baton, MD  REFERRING PROVIDER: Shon Baton, MD  REFERRING DIAG:  R26.81 (ICD-10-CM) - Unsteadiness on feet  Z91.81 (ICD-10-CM) - History of falling    THERAPY DIAG:  Unsteadiness on feet  Muscle weakness (generalized)  Difficulty in walking, not elsewhere classified  Rationale for Evaluation and Treatment: Rehabilitation  ONSET DATE: chronic- many falls over the the past 2 years  SUBJECTIVE:   SUBJECTIVE STATEMENT: Patient states the left hip pain is gone.  "I think the ice and stretches last visit did the trick"    PERTINENT HISTORY: Frequent falls, RA, osteoporosis, Interstitial lyme disease, Lt thigh pain after shingles 20  years ago PAIN:  Are you having pain? No  PRECAUTIONS: Fall and Other: osteopenia  WEIGHT BEARING RESTRICTIONS: No  FALLS:  Has patient fallen in last 6 months? Yes. 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   PLOF: Independent and Leisure: sewing  PATIENT GOALS: reduce falls, improve gait, improve strength   NEXT MD VISIT: 2 months   OBJECTIVE:   DIAGNOSTIC FINDINGS: NA    COGNITION: Overall cognitive status: Within functional limits for tasks  assessed     SENSATION: WFL    POSTURE: rounded shoulders, forward head, and flexed trunk   PALPATION: NA  LOWER EXTREMITY MMT:  4/5 bil hip strength, 4+/5 bil knee strength, 4/5 ankle strength    FUNCTIONAL TESTS:  At Eval:            5 times sit to stand: 21.87with use of hands, backs of legs on chair, and uncontrolled descent   Timed up and go (TUG): 19.84 seconds with use of cane   08/10/22: 5x sit to stand: 22.66 with less use of back of legs and more control with stand to sit  08/18/22: 5x sit to stand: 18.16 sec with slight use of hands on knees (more for balance)   TUG: 13.34 sec   GAIT: Distance walked: 100 Assistive device utilized: Quad cane small base Level of assistance: Modified independence Comments: scissoring gait, reduced trunk rotation  TODAY'S TREATMENT:                                                                     DATE: 08/25/22  NuStep: Level 5 (new model) arms/legs x 6 minutes  Sit to stand with 5 lb kb 2 x 10 Squats to table x 10 with 5 lb  Seated LAQ with 4 lb 2 x 10 Seated march x 20 with 4 lb Standing alternating tap ups on yellow hurdles x 20 Standing alternating tap ups on cone x 20 Marching on balance pad x 20 Stepping up and off balance pad Patient needed frequent rest and water breaks  TODAY'S TREATMENT:                                                                     DATE: 08/21/22  NuStep: Level 5 (new model) arms/legs x 6 minutes  Supine hamstring stretch 3 x 30 sec both Supine IT band stretch 3 x 30 sec both Seated piriformis stretch 3 x 30 sec  Ice x 10 min to left hip seated with pillow and strap in chair with arms.      TODAY'S TREATMENT:  DATE: 08/18/22  NuStep: Level 5 (new model) arms/legs x 6 minutes  Alternating step tap ups fwd x 20 with UE support then x 20 without UE support Lateral step tap ups 2 x 10 each side without UE support 10th visit  progress note completed Sit to stand x 10 practicing weight shift Remaining time spent discussing and educating patient on the nervous system and how it affects her balance along with the fact that she is unable to use her forefoot and toes to assist with proper gait and weight shift.  She inquired about personal trainer for when she has completed her formal PT. Researched contacts for her.  Discussed proper ankle and arch support in her shoes and how lack of this will also contribute to her issues.     PATIENT EDUCATION:  Education details: Access Code: [phone] Person educated: Patient Education method: Explanation, Demonstration, and Handouts Education comprehension: verbalized understanding and returned demonstration  HOME EXERCISE PROGRAM: Access Code: [email or web address] URL: [email or web address] Date: 07/15/2022 Prepared by: Claiborne Billings  Exercises - Seated Long Arc Quad  - 3 x daily - 7 x weekly - 2 sets - 10 reps - 5 hold - Seated March   - 3 x daily - 7 x weekly - 3 sets - 10 reps - Seated Heel Toe Raises   - 3 x daily - 7 x weekly - 2 sets - 10 reps - Sit to Stand  - 3 x daily - 7 x weekly - 1-2 sets - 5-10 reps  ASSESSMENT:  CLINICAL IMPRESSION: Zohar reported resolution of hip pain after last visit and did fairly well resuming balance and LE strengthening tasks today.  She continues to avoid weight shift onto toes but is improving with weight shift onto forefoot/ball of foot.  She had several losses of balance with balance pad tasks on first few reps but then acclimated with verbal cues to weight shift onto ball of foot and did well.    Patient will benefit from continuing skilled PT to address the below impairments and improve overall function.   OBJECTIVE IMPAIRMENTS: decreased activity tolerance, decreased endurance, decreased mobility, difficulty walking, impaired flexibility, improper body mechanics, postural dysfunction, and pain.   ACTIVITY LIMITATIONS: standing,  squatting, transfers, and locomotion level  PARTICIPATION LIMITATIONS: meal prep, cleaning, shopping, community activity, and yard work  PERSONAL FACTORS: 1-2 comorbidities: frequent falls, osteoporosis , lung disease  are also affecting patient's functional outcome.   REHAB POTENTIAL: Good  CLINICAL DECISION MAKING: Evolving/moderate complexity  EVALUATION COMPLEXITY: Moderate   GOALS: Goals reviewed with patient? Yes  SHORT TERM GOALS: Target date: 08/12/2022   Be independent in initial HEP Baseline: Goal status: MET  2.  Improve LE strength to perform sit to stand transition without use of legs on back of chair Baseline: less use of back of legs (08/10/22) Goal status: IN PROGRESS  3.  Perform 5x sit to stand in < or = to 17 seconds to reduce falls risk Baseline: 22.6 (08/10/22) Goal status: IN PROGRESS  4.  Ambulate short distances in clinic with normalized base of support and 50% reduced scissoring  Baseline: this varies but is less frequent (08/12/22) Goal status: IN PROGRESS   LONG TERM GOALS: Target date: 09/09/2022    Be independent in advanced HEP Baseline:  Goal status: MET  2.  Perform sit to stand with min to no use of UE support due to improved LE strength Baseline:  Goal status: MET  3.  Perform TUG in < or =  to 13 seconds without instability to reduce falls risk Baseline:  Goal status: IN PROGRESS  4.  Perform 5x sit to stand in < or = to 14 seconds to reduce falls risk Baseline:  Goal status: IN PROGRESS  5.  Report no falls at home or in the community Baseline:  Goal status: IN PROGRESS    PLAN:  PT FREQUENCY: 2x/week  PT DURATION: 8 weeks  PLANNED INTERVENTIONS: Therapeutic exercises, Therapeutic activity, Neuromuscular re-education, Balance training, Gait training, Patient/Family education, Self Care, Joint mobilization, Aquatic Therapy, Cryotherapy, Moist heat, Manual therapy, and Re-evaluation  PLAN FOR NEXT SESSION: continue to  work on LE strength,weight shifting,  functional mobility and gait along with balance training.    Anderson Malta B. Paulina Muchmore, PT 08/25/22 1:22 PM  [facility] [address] [place], [place] [address] Phone # [phone] Fax [phone]

## 2022-08-26 ENCOUNTER — Other Ambulatory Visit: Payer: Self-pay | Admitting: *Deleted

## 2022-08-26 NOTE — Progress Notes (Signed)
Opened in error

## 2022-08-27 ENCOUNTER — Ambulatory Visit: Payer: Medicare Other

## 2022-08-27 DIAGNOSIS — R262 Difficulty in walking, not elsewhere classified: Secondary | ICD-10-CM | POA: Diagnosis not present

## 2022-08-27 DIAGNOSIS — R2681 Unsteadiness on feet: Secondary | ICD-10-CM

## 2022-08-27 DIAGNOSIS — R293 Abnormal posture: Secondary | ICD-10-CM | POA: Diagnosis not present

## 2022-08-27 DIAGNOSIS — M6281 Muscle weakness (generalized): Secondary | ICD-10-CM | POA: Diagnosis not present

## 2022-08-27 DIAGNOSIS — R252 Cramp and spasm: Secondary | ICD-10-CM

## 2022-08-27 NOTE — Therapy (Signed)
OUTPATIENT PHYSICAL THERAPY LOWER EXTREMITY TREATMENT NOTE   Patient Name: Stacey [name] MRN: [id number] DOB:12-Aug-1940, 82 y.o., female Today's Date: 08/28/2022  END OF SESSION:  PT End of Session - 08/27/22 1454     Visit Number 13    Number of Visits 17    Date for PT Re-Evaluation 09/09/22    Authorization Type Medicare/BCBS    PT Start Time 1448    PT Stop Time 1530    PT Time Calculation (min) 42 min    Activity Tolerance Patient tolerated treatment well;Patient limited by pain    Behavior During Therapy [facility] for tasks assessed/performed               Past Medical History:  Diagnosis Date   Abnormal finding of blood chemistry    Asthma    H/O measles    H/O varicella    Hypertension    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[place])    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   Lichen sclerosus [id number]   Asymptomatic   Low iron    Mitral valve prolapse    Osteoarthritis    Osteoporosis    Pneumonia    Post herpetic neuralgia    Rheumatoid arthritis(714.0)    Yeast infection    Past Surgical History:  Procedure Laterality Date   CHOLECYSTECTOMY  2011   IR ANGIO INTRA EXTRACRAN SEL COM CAROTID INNOMINATE BILAT MOD SED  06/18/2020   IR ANGIO VERTEBRAL SEL SUBCLAVIAN INNOMINATE UNI R MOD SED  06/18/2020   IR ANGIO VERTEBRAL SEL VERTEBRAL UNI L MOD SED  06/18/2020   WISDOM TOOTH EXTRACTION     Patient Active Problem List   Diagnosis Date Noted   Chronic cough 06/12/2020   Constipation    CAP (community acquired pneumonia) 02/09/2020   Acute on chronic respiratory failure with hypoxia ([place]) 07/06/2019   Fall at home, initial encounter 07/06/2019   Orthostatic hypotension 07/06/2019   Rib fractures 07/04/2019   Pneumonia due to COVID-19 virus 06/11/2019   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[place]) 05/07/2019   Acute asthma exacerbation 12/18/2018   Elevated troponin 12/18/2018   Acute bronchitis 08/30/2018   Chronic respiratory failure with hypoxia ([place]) 04/06/2018   Essential  hypertension 03/24/2018   Depression 03/24/2018   Asthma exacerbation 03/23/2018   DOE (dyspnea on exertion) 03/21/2018   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 esophagitis 03/21/2018   Chronic lung disease    Hypoxia    Dyspnea 01/23/2018   ILD (interstitial lung disease) ([place]) 01/23/2018   Claw toe, acquired, 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   Restrictive lung disease 07/18/2013   Multiple pulmonary nodules/RA lung dz  05/29/2013   Cough variant asthma vs uacs  05/29/2013   Rheumatoid arthritis ([place]) 01/13/2012   Osteopenia 123XX123   Lichen sclerosus et atrophicus of the vulva 01/13/2012   Asthma 01/13/2012    PCP: Shon Baton, MD  REFERRING PROVIDER: Shon Baton, MD  REFERRING DIAG:  R26.81 (ICD-10-CM) - Unsteadiness on feet  Z91.81 (ICD-10-CM) - History of falling    THERAPY DIAG:  Unsteadiness on feet  Muscle weakness (generalized)  Difficulty in walking, not elsewhere classified  Cramp and spasm  Abnormal posture  Rationale for Evaluation and Treatment: Rehabilitation  ONSET DATE: chronic- many falls over the the past 2 years  SUBJECTIVE:   SUBJECTIVE STATEMENT: Patient states still no left hip pain.     PERTINENT HISTORY: Frequent falls, RA, osteoporosis, Interstitial lyme disease, Lt thigh pain after shingles 20 years ago PAIN:  Are you having pain? No  PRECAUTIONS: Fall and Other:  osteopenia  WEIGHT BEARING RESTRICTIONS: No  FALLS:  Has patient fallen in last 6 months? Yes. 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   PLOF: Independent and Leisure: sewing  PATIENT GOALS: reduce falls, improve gait, improve strength   NEXT MD VISIT: 2 months   OBJECTIVE:   DIAGNOSTIC FINDINGS: NA    COGNITION: Overall cognitive status: Within functional limits for tasks assessed     SENSATION: WFL    POSTURE: rounded shoulders,  forward head, and flexed trunk   PALPATION: NA  LOWER EXTREMITY MMT:  4/5 bil hip strength, 4+/5 bil knee strength, 4/5 ankle strength    FUNCTIONAL TESTS:  At Eval:            5 times sit to stand: 21.87with use of hands, backs of legs on chair, and uncontrolled descent   Timed up and go (TUG): 19.84 seconds with use of cane   08/10/22: 5x sit to stand: 22.66 with less use of back of legs and more control with stand to sit  08/18/22: 5x sit to stand: 18.16 sec with slight use of hands on knees (more for balance)   TUG: 13.34 sec   GAIT: Distance walked: 100 Assistive device utilized: Quad cane small base Level of assistance: Modified independence Comments: scissoring gait, reduced trunk rotation  TODAY'S TREATMENT:                                                                     DATE: 08/27/22  NuStep: Level 5 (new model) arms/legs x 5 minutes  Sit to stand with 5 lb kb  x 10  Patient needs occasional water breaks and rest between exercises Squats to table x 10 with 5 lb  Seated LAQ with 5 lb 2 x 10 Seated march x 20 with 5 lb Seated hip ER with 5 lb 2 x 10 Standing alternating tap ups on footstool x 20 Standing alternating tap ups on cone x 20 Tactile and verbal cues with repeated left foot tap up on cone to use glute med and shift onto right LE properly to avoid falling over to left x 10 Worked on stance time on right LE by placing left foot on foot stool and maintaining balance on right x 3 , replaced foot stool with cone and did 10 attempts  TODAY'S TREATMENT:                                                                     DATE: 08/25/22  NuStep: Level 5 (new model) arms/legs x 6 minutes  Sit to stand with 5 lb kb 2 x 10 Squats to table x 10 with 5 lb  Seated LAQ with 4 lb 2 x 10 Seated march x 20 with 4 lb Standing alternating tap ups on yellow hurdles x 20 Standing alternating tap ups on cone x 20 Marching on balance pad  x 20 Stepping up and off balance  pad Patient needed frequent rest and water breaks  TODAY'S TREATMENT:                                                                     DATE: 08/21/22  NuStep: Level 5 (new model) arms/legs x 6 minutes  Supine hamstring stretch 3 x 30 sec both Supine IT band stretch 3 x 30 sec both Seated piriformis stretch 3 x 30 sec  Ice x 10 min to left hip seated with pillow and strap in chair with arms.      PATIENT EDUCATION:  Education details: Access Code: [phone] Person educated: Patient Education method: Explanation, Demonstration, and Handouts Education comprehension: verbalized understanding and returned demonstration  HOME EXERCISE PROGRAM: Access Code: [email or web address] URL: [email or web address] Date: 07/15/2022 Prepared by: Claiborne Billings  Exercises - Seated Long Arc Quad  - 3 x daily - 7 x weekly - 2 sets - 10 reps - 5 hold - Seated March   - 3 x daily - 7 x weekly - 3 sets - 10 reps - Seated Heel Toe Raises   - 3 x daily - 7 x weekly - 2 sets - 10 reps - Sit to Stand  - 3 x daily - 7 x weekly - 1-2 sets - 5-10 reps  ASSESSMENT:  CLINICAL IMPRESSION: Damari did fairly well today.  She continues to have some difficulty with limited stance time on left LE which causes her to list to right at times.  She is using a cane at times when going out but admits she is less dependent on it.    Patient will benefit from continuing skilled PT to address the below impairments and improve overall function.   OBJECTIVE IMPAIRMENTS: decreased activity tolerance, decreased endurance, decreased mobility, difficulty walking, impaired flexibility, improper body mechanics, postural dysfunction, and pain.   ACTIVITY LIMITATIONS: standing, squatting, transfers, and locomotion level  PARTICIPATION LIMITATIONS: meal prep, cleaning, shopping, community activity, and yard work  PERSONAL FACTORS: 1-2 comorbidities: frequent falls, osteoporosis , lung disease  are also affecting patient's functional outcome.    REHAB POTENTIAL: Good  CLINICAL DECISION MAKING: Evolving/moderate complexity  EVALUATION COMPLEXITY: Moderate   GOALS: Goals reviewed with patient? Yes  SHORT TERM GOALS: Target date: 08/12/2022   Be independent in initial HEP Baseline: Goal status: MET  2.  Improve LE strength to perform sit to stand transition without use of legs on back of chair Baseline: less use of back of legs (08/10/22) Goal status: IN PROGRESS  3.  Perform 5x sit to stand in < or = to 17 seconds to reduce falls risk Baseline: 22.6 (08/10/22) Goal status: IN PROGRESS  4.  Ambulate short distances in clinic with normalized base of support and 50% reduced scissoring  Baseline: this varies but is less frequent (08/12/22) Goal status: IN PROGRESS   LONG TERM GOALS: Target date: 09/09/2022    Be independent in advanced HEP Baseline:  Goal status: MET  2.  Perform sit to stand with min to no use of UE support due to improved LE strength Baseline:  Goal status: MET  3.  Perform TUG in < or = to 13 seconds without instability to reduce falls risk Baseline:  Goal status: IN  PROGRESS  4.  Perform 5x sit to stand in < or = to 14 seconds to reduce falls risk Baseline:  Goal status: IN PROGRESS  5.  Report no falls at home or in the community Baseline:  Goal status: IN PROGRESS    PLAN:  PT FREQUENCY: 2x/week  PT DURATION: 8 weeks  PLANNED INTERVENTIONS: Therapeutic exercises, Therapeutic activity, Neuromuscular re-education, Balance training, Gait training, Patient/Family education, Self Care, Joint mobilization, Aquatic Therapy, Cryotherapy, Moist heat, Manual therapy, and Re-evaluation  PLAN FOR NEXT SESSION: continue to work on LE strength,weight shifting,  functional mobility and gait along with balance training.    Anderson Malta B. Launi Asencio, PT 08/28/22 9:46 AM  [facility] [address] [place], [place] [address] Phone # [phone] Fax [phone]

## 2022-08-30 ENCOUNTER — Encounter (HOSPITAL_BASED_OUTPATIENT_CLINIC_OR_DEPARTMENT_OTHER): Payer: Self-pay | Admitting: Cardiology

## 2022-09-01 ENCOUNTER — Ambulatory Visit: Payer: Medicare Other

## 2022-09-03 ENCOUNTER — Ambulatory Visit: Payer: Medicare Other

## 2022-09-03 DIAGNOSIS — R293 Abnormal posture: Secondary | ICD-10-CM | POA: Diagnosis not present

## 2022-09-03 DIAGNOSIS — R262 Difficulty in walking, not elsewhere classified: Secondary | ICD-10-CM | POA: Diagnosis not present

## 2022-09-03 DIAGNOSIS — M6281 Muscle weakness (generalized): Secondary | ICD-10-CM | POA: Diagnosis not present

## 2022-09-03 DIAGNOSIS — R252 Cramp and spasm: Secondary | ICD-10-CM

## 2022-09-03 DIAGNOSIS — R2681 Unsteadiness on feet: Secondary | ICD-10-CM | POA: Diagnosis not present

## 2022-09-03 NOTE — Therapy (Signed)
OUTPATIENT PHYSICAL THERAPY LOWER EXTREMITY TREATMENT NOTE   Patient Name: Stacey [name] MRN: [id number] DOB:[birth year], 82 y.o., female Today's Date: 09/03/2022  END OF SESSION:  PT End of Session - 09/03/22 1440     Visit Number 14    Number of Visits 17    Date for PT Re-Evaluation 09/09/22    Authorization Type Medicare/BCBS    Progress Note Due on Visit 20    PT Start Time 1400    PT Stop Time 1445    PT Time Calculation (min) 45 min               Past Medical History:  Diagnosis Date   Abnormal finding of blood chemistry    Asthma    H/O measles    H/O varicella    Hypertension    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[place])    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   Lichen sclerosus [id number]   Asymptomatic   Low iron    Mitral valve prolapse    Osteoarthritis    Osteoporosis    Pneumonia    Post herpetic neuralgia    Rheumatoid arthritis(714.0)    Yeast infection    Past Surgical History:  Procedure Laterality Date   CHOLECYSTECTOMY  2011   IR ANGIO INTRA EXTRACRAN SEL COM CAROTID INNOMINATE BILAT MOD SED  06/18/2020   IR ANGIO VERTEBRAL SEL SUBCLAVIAN INNOMINATE UNI R MOD SED  06/18/2020   IR ANGIO VERTEBRAL SEL VERTEBRAL UNI L MOD SED  06/18/2020   WISDOM TOOTH EXTRACTION     Patient Active Problem List   Diagnosis Date Noted   Chronic cough 06/12/2020   Constipation    CAP (community acquired pneumonia) 02/09/2020   Acute on chronic respiratory failure with hypoxia ([place]) 07/06/2019   Fall at home, initial encounter 07/06/2019   Orthostatic hypotension 07/06/2019   Rib fractures 07/04/2019   Pneumonia due to COVID-19 virus 06/11/2019   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[place]) 05/07/2019   Acute asthma exacerbation 12/18/2018   Elevated troponin 12/18/2018   Acute bronchitis 08/30/2018   Chronic respiratory failure with hypoxia ([place]) 04/06/2018   Essential hypertension 03/24/2018   Depression 03/24/2018   Asthma exacerbation 03/23/2018   DOE (dyspnea on exertion)  03/21/2018   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 esophagitis 03/21/2018   Chronic lung disease    Hypoxia    Dyspnea 01/23/2018   ILD (interstitial lung disease) ([place]) 01/23/2018   Claw toe, acquired, 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   Restrictive lung disease 07/18/2013   Multiple pulmonary nodules/RA lung dz  05/29/2013   Cough variant asthma vs uacs  05/29/2013   Rheumatoid arthritis ([place]) 01/13/2012   Osteopenia 123XX123   Lichen sclerosus et atrophicus of the vulva 01/13/2012   Asthma 01/13/2012    PCP: Shon Baton, MD  REFERRING PROVIDER: Shon Baton, MD  REFERRING DIAG:  R26.81 (ICD-10-CM) - Unsteadiness on feet  Z91.81 (ICD-10-CM) - History of falling    THERAPY DIAG:  Unsteadiness on feet  Muscle weakness (generalized)  Cramp and spasm  Difficulty in walking, not elsewhere classified  Rationale for Evaluation and Treatment: Rehabilitation  ONSET DATE: chronic- many falls over the the past 2 years  SUBJECTIVE:   SUBJECTIVE STATEMENT: Patient accompanied by daughter today.  Daughter wanted to sit in on today's session to see how she could help with exercises at home.       PERTINENT HISTORY: Frequent falls, RA, osteoporosis, Interstitial lyme disease, Lt thigh pain after shingles 20 years ago PAIN:  Are you having pain? No  PRECAUTIONS:  Fall and Other: osteopenia  WEIGHT BEARING RESTRICTIONS: No  FALLS:  Has patient fallen in last 6 months? Yes. 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   PLOF: Independent and Leisure: sewing  PATIENT GOALS: reduce falls, improve gait, improve strength   NEXT MD VISIT: 2 months   OBJECTIVE:   DIAGNOSTIC FINDINGS: NA    COGNITION: Overall cognitive status: Within functional limits for tasks assessed     SENSATION: WFL    POSTURE: rounded shoulders, forward head, and flexed trunk    PALPATION: NA  LOWER EXTREMITY MMT:  4/5 bil hip strength, 4+/5 bil knee strength, 4/5 ankle strength    FUNCTIONAL TESTS:  At Eval:            5 times sit to stand: 21.87with use of hands, backs of legs on chair, and uncontrolled descent   Timed up and go (TUG): 19.84 seconds with use of cane   08/10/22: 5x sit to stand: 22.66 with less use of back of legs and more control with stand to sit  08/18/22: 5x sit to stand: 18.16 sec with slight use of hands on knees (more for balance)   TUG: 13.34 sec   GAIT: Distance walked: 100 Assistive device utilized: Quad cane small base Level of assistance: Modified independence Comments: scissoring gait, reduced trunk rotation  TODAY'S TREATMENT:                                                                     DATE: 09/03/22 NuStep: Level 5 (new model) arms/legs x 5 minutes Daughter had some questions re: patient safety and progress  Sit to stand with 5 lb kb  x 10  Squats to table x 10 with 5 lb  Standing alternating tap ups on cone x 20 Marching on balance pad x 20 Heel to toe on balance pad x 20 Lateral band walks with green loop x 3 laps of 10 steps each way.   Single leg stance x 5 each side attempting 10 sec hold Educated on balance strategies and how to use foot, ankle, knee, hip and core to do balance tasks  TODAY'S TREATMENT:                                                                     DATE: 08/27/22  NuStep: Level 5 (new model) arms/legs x 5 minutes  Sit to stand with 5 lb kb  x 10  Patient needs occasional water breaks and rest between exercises Squats to table x 10 with 5 lb  Seated LAQ with 5 lb 2 x 10 Seated march x 20 with 5 lb Seated hip ER with 5 lb 2 x 10 Standing alternating tap ups on footstool x 20 Standing alternating tap ups on cone x 20 Tactile and verbal cues with repeated left foot tap up on cone to use glute med and shift  onto right LE properly to avoid falling over to left x 10 Worked on stance  time on right LE by placing left foot on foot stool and maintaining balance on right x 3 , replaced foot stool with cone and did 10 attempts  TODAY'S TREATMENT:                                                                     DATE: 08/25/22  NuStep: Level 5 (new model) arms/legs x 6 minutes  Sit to stand with 5 lb kb 2 x 10 Squats to table x 10 with 5 lb  Seated LAQ with 4 lb 2 x 10 Seated march x 20 with 4 lb Standing alternating tap ups on yellow hurdles x 20 Standing alternating tap ups on cone x 20 Marching on balance pad x 20 Stepping up and off balance pad Patient needed frequent rest and water breaks   PATIENT EDUCATION:  Education details: Access Code: [email or web address] Person educated: Patient Education method: Explanation, Demonstration, and Handouts Education comprehension: verbalized understanding and returned demonstration  HOME EXERCISE PROGRAM: Access Code: [email or web address] URL: [email or web address] Date: 07/15/2022 Prepared by: Claiborne Billings  Exercises - Seated Long Arc Quad  - 3 x daily - 7 x weekly - 2 sets - 10 reps - 5 hold - Seated March   - 3 x daily - 7 x weekly - 3 sets - 10 reps - Seated Heel Toe Raises   - 3 x daily - 7 x weekly - 2 sets - 10 reps - Sit to Stand  - 3 x daily - 7 x weekly - 1-2 sets - 5-10 reps  ASSESSMENT:  CLINICAL IMPRESSION: Marijah is progressing appropriately.  Right glut medius is still quite weak but improving.  She continues to lack knee flexion on swing through during gait. This is most likely because she avoids toe off due to her toe deformities on both feet.   Patient will benefit from continuing skilled PT to address the below impairments and improve overall function.   OBJECTIVE IMPAIRMENTS: decreased activity tolerance, decreased endurance, decreased mobility, difficulty walking, impaired flexibility, improper body mechanics, postural dysfunction, and pain.   ACTIVITY LIMITATIONS: standing, squatting, transfers, and locomotion  level  PARTICIPATION LIMITATIONS: meal prep, cleaning, shopping, community activity, and yard work  PERSONAL FACTORS: 1-2 comorbidities: frequent falls, osteoporosis , lung disease  are also affecting patient's functional outcome.   REHAB POTENTIAL: Good  CLINICAL DECISION MAKING: Evolving/moderate complexity  EVALUATION COMPLEXITY: Moderate   GOALS: Goals reviewed with patient? Yes  SHORT TERM GOALS: Target date: 08/12/2022   Be independent in initial HEP Baseline: Goal status: MET  2.  Improve LE strength to perform sit to stand transition without use of legs on back of chair Baseline: less use of back of legs (08/10/22) Goal status: MET  3.  Perform 5x sit to stand in < or = to 17 seconds to reduce falls risk Baseline: 22.6 (08/10/22) Goal status: MET  4.  Ambulate short distances in clinic with normalized base of support and 50% reduced scissoring  Baseline: this varies but is less frequent (08/12/22) Goal status: MET   LONG TERM GOALS: Target date: 09/09/2022    Be independent in advanced HEP Baseline:  Goal status: MET  2.  Perform sit to stand with min to no use of UE support due to improved LE strength Baseline:  Goal status: MET  3.  Perform TUG in < or = to 13 seconds without instability to reduce falls risk Baseline:  Goal status: IN PROGRESS  4.  Perform 5x sit to stand in < or = to 14 seconds to reduce falls risk Baseline:  Goal status: IN PROGRESS  5.  Report no falls at home or in the community Baseline:  Goal status: IN PROGRESS    PLAN:  PT FREQUENCY: 2x/week  PT DURATION: 8 weeks  PLANNED INTERVENTIONS: Therapeutic exercises, Therapeutic activity, Neuromuscular re-education, Balance training, Gait training, Patient/Family education, Self Care, Joint mobilization, Aquatic Therapy, Cryotherapy, Moist heat, Manual therapy, and Re-evaluation  PLAN FOR NEXT SESSION: continue to work on LE strength,weight shifting,  functional mobility and  gait along with balance training.    Anderson Malta B. Shamonique Battiste, PT 09/03/22 5:33 PM  [facility] [address], [place], [place] [address] Phone # [phone] Fax [phone]

## 2022-09-08 ENCOUNTER — Ambulatory Visit: Payer: Medicare Other

## 2022-09-08 DIAGNOSIS — R252 Cramp and spasm: Secondary | ICD-10-CM

## 2022-09-08 DIAGNOSIS — M6281 Muscle weakness (generalized): Secondary | ICD-10-CM | POA: Diagnosis not present

## 2022-09-08 DIAGNOSIS — R293 Abnormal posture: Secondary | ICD-10-CM | POA: Diagnosis not present

## 2022-09-08 DIAGNOSIS — R2681 Unsteadiness on feet: Secondary | ICD-10-CM

## 2022-09-08 DIAGNOSIS — R262 Difficulty in walking, not elsewhere classified: Secondary | ICD-10-CM | POA: Diagnosis not present

## 2022-09-08 NOTE — Therapy (Signed)
OUTPATIENT PHYSICAL THERAPY LOWER EXTREMITY TREATMENT NOTE PHYSICAL THERAPY DISCHARGE SUMMARY  Visits from Start of Care: 15  Current functional level related to goals / functional outcomes: See below   Remaining deficits: See below   Education / Equipment: See below   Patient agrees to discharge. Patient goals were partially met. Patient is being discharged due to maximized rehab potential.     Patient Name: Stacey [name] MRN: [id number] DOB:May 30, 1941, 82 y.o., female Today's Date: 09/08/2022  END OF SESSION:  PT End of Session - 09/08/22 1503     Visit Number 15    Number of Visits 17    Date for PT Re-Evaluation 09/09/22    Authorization Type Medicare/BCBS    PT Start Time [id number]    PT Stop Time 1530    PT Time Calculation (min) 45 min    Activity Tolerance Patient tolerated treatment well;Patient limited by fatigue    Behavior During Therapy [facility] for tasks assessed/performed               Past Medical History:  Diagnosis Date   Abnormal finding of blood chemistry    Asthma    H/O measles    H/O varicella    Hypertension    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[place])    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   Lichen sclerosus [id number]   Asymptomatic   Low iron    Mitral valve prolapse    Osteoarthritis    Osteoporosis    Pneumonia    Post herpetic neuralgia    Rheumatoid arthritis(714.0)    Yeast infection    Past Surgical History:  Procedure Laterality Date   CHOLECYSTECTOMY  2011   IR ANGIO INTRA EXTRACRAN SEL COM CAROTID INNOMINATE BILAT MOD SED  06/18/2020   IR ANGIO VERTEBRAL SEL SUBCLAVIAN INNOMINATE UNI R MOD SED  06/18/2020   IR ANGIO VERTEBRAL SEL VERTEBRAL UNI L MOD SED  06/18/2020   WISDOM TOOTH EXTRACTION     Patient Active Problem List   Diagnosis Date Noted   Chronic cough 06/12/2020   Constipation    CAP (community acquired pneumonia) 02/09/2020   Acute on chronic respiratory failure with hypoxia ([place]) 07/06/2019   Fall at home, initial  encounter 07/06/2019   Orthostatic hypotension 07/06/2019   Rib fractures 07/04/2019   Pneumonia due to COVID-19 virus 06/11/2019   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[place]) 05/07/2019   Acute asthma exacerbation 12/18/2018   Elevated troponin 12/18/2018   Acute bronchitis 08/30/2018   Chronic respiratory failure with hypoxia ([place]) 04/06/2018   Essential hypertension 03/24/2018   Depression 03/24/2018   Asthma exacerbation 03/23/2018   DOE (dyspnea on exertion) 03/21/2018   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 esophagitis 03/21/2018   Chronic lung disease    Hypoxia    Dyspnea 01/23/2018   ILD (interstitial lung disease) ([place]) 01/23/2018   Claw toe, acquired, 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   Restrictive lung disease 07/18/2013   Multiple pulmonary nodules/RA lung dz  05/29/2013   Cough variant asthma vs uacs  05/29/2013   Rheumatoid arthritis ([place]) 01/13/2012   Osteopenia 123XX123   Lichen sclerosus et atrophicus of the vulva 01/13/2012   Asthma 01/13/2012    PCP: Shon Baton, MD  REFERRING PROVIDER: Shon Baton, MD  REFERRING DIAG:  R26.81 (ICD-10-CM) - Unsteadiness on feet  Z91.81 (ICD-10-CM) - History of falling    THERAPY DIAG:  Unsteadiness on feet  Muscle weakness (generalized)  Cramp and spasm  Difficulty in walking, not elsewhere classified  Rationale for Evaluation and Treatment: Rehabilitation  ONSET DATE:  chronic- many falls over the the past 2 years  SUBJECTIVE:   SUBJECTIVE STATEMENT: Patient denies any falls.  Not using her cane as much but keeps it in the car for days when she doesn't feel as steady.         PERTINENT HISTORY: Frequent falls, RA, osteoporosis, Interstitial lyme disease, Lt thigh pain after shingles 20 years ago PAIN:  Are you having pain? No  PRECAUTIONS: Fall and Other: osteopenia  WEIGHT BEARING RESTRICTIONS: No  FALLS:  Has patient fallen in last 6 months? Yes. 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  OCCUPATION: retired   PLOF: Independent and Leisure: sewing  PATIENT GOALS: reduce falls, improve gait, improve strength   NEXT MD VISIT: 2 months   OBJECTIVE:   DIAGNOSTIC FINDINGS: NA    COGNITION: Overall cognitive status: Within functional limits for tasks assessed     SENSATION: WFL    POSTURE: rounded shoulders, forward head, and flexed trunk   PALPATION: NA  LOWER EXTREMITY MMT:  4/5 bil hip strength, 4+/5 bil knee strength, 4/5 ankle strength  09/08/22: 4/5 hip extension, all others improved to 4+/5, Bilateral knee strength 5-/5, ankle strength 5/5 all    FUNCTIONAL TESTS:  At Eval:            5 times sit to stand: 21.87with use of hands, backs of legs on chair, and uncontrolled descent   Timed up and go (TUG): 19.84 seconds with use of cane   08/10/22: 5x sit to stand: 22.66 with less use of back of legs and more control with stand to sit  08/18/22: 5x sit to stand: 18.16 sec with slight use of hands on knees (more for balance)   TUG: 13.34 sec with cane  09/08/22: 5 times sit to stand: 16.29 without use of hands and proper weight shift   Timed up and go (TUG): 14.83 seconds without cane    GAIT: Distance walked: 100 Assistive device utilized: Quad cane small base Level of assistance: Modified independence Comments: scissoring gait, reduced trunk rotation  TODAY'S TREATMENT:                                                                     DATE: 09/08/22 NuStep: Level 5 (new model) arms/legs x 5 minutes Seated LAQ x 20 with 4 lb Seated up and over hurdle with 4lb x 10 each LE Sit to stand with 2 lb hand weights to overhead press 2 x 10  Squats to table x 10 with 5 lb  DC assessment and DC plan completed Educated on balance strategies, fall prevention, home safety and use of DME  TODAY'S TREATMENT:                                                                      DATE: 09/03/22 NuStep: Level 5 (new model) arms/legs x 5 minutes Daughter had some  questions re: patient safety and progress  Sit to stand with 5 lb kb  x 10  Squats to table x 10 with 5 lb  Standing alternating tap ups on cone x 20 Marching on balance pad x 20 Heel to toe on balance pad x 20 Lateral band walks with green loop x 3 laps of 10 steps each way.   Single leg stance x 5 each side attempting 10 sec hold Educated on balance strategies and how to use foot, ankle, knee, hip and core to do balance tasks  TODAY'S TREATMENT:                                                                     DATE: 08/27/22  NuStep: Level 5 (new model) arms/legs x 5 minutes  Sit to stand with 5 lb kb  x 10  Patient needs occasional water breaks and rest between exercises Squats to table x 10 with 5 lb  Seated LAQ with 5 lb 2 x 10 Seated march x 20 with 5 lb Seated hip ER with 5 lb 2 x 10 Standing alternating tap ups on footstool x 20 Standing alternating tap ups on cone x 20 Tactile and verbal cues with repeated left foot tap up on cone to use glute med and shift onto right LE properly to avoid falling over to left x 10 Worked on stance time on right LE by placing left foot on foot stool and maintaining balance on right x 3 , replaced foot stool with cone and did 10 attempts   PATIENT EDUCATION:  Education details: Access Code: [email or web address] Person educated: Patient Education method: Explanation, Demonstration, and Handouts Education comprehension: verbalized understanding and returned demonstration  HOME EXERCISE PROGRAM: Access Code: [email or web address] URL: [email or web address] Date: 07/15/2022 Prepared by: Claiborne Billings  Exercises - Seated Long Arc Quad  - 3 x daily - 7 x weekly - 2 sets - 10 reps - 5 hold - Seated March   - 3 x daily - 7 x weekly - 3 sets - 10 reps - Seated Heel Toe Raises   - 3 x daily - 7 x weekly - 2 sets - 10 reps - Sit to Stand  - 3 x daily - 7 x weekly - 1-2 sets - 5-10  reps  ASSESSMENT:  CLINICAL IMPRESSION: Branden seems to have met max potential at this point.  She continues to demonstrate a very similar gait pattern as when she started PT.  However, she has not fallen since beginning PT.  Her objective findings are much improved.  She is ambulating without a.d. in the home but takes the cane with her when she is out shopping.  She is compliant and well motivated.  She would benefit from continuing her HEP and possibly begin a consistent workout routine to avoid losing strength.  She understands that safety is a priority and that she should use the cane when she is feeling unsteady.   OBJECTIVE IMPAIRMENTS: decreased activity tolerance, decreased endurance, decreased mobility, difficulty walking, impaired flexibility, improper body mechanics, postural dysfunction, and pain.   ACTIVITY LIMITATIONS: standing, squatting, transfers, and locomotion level  PARTICIPATION LIMITATIONS: meal prep, cleaning, shopping, community activity, and yard work  PERSONAL FACTORS: 1-2 comorbidities: frequent falls, osteoporosis , lung disease  are also affecting patient's functional outcome.   REHAB POTENTIAL: Good  CLINICAL DECISION MAKING: Evolving/moderate complexity  EVALUATION COMPLEXITY: Moderate   GOALS: Goals reviewed with patient? Yes  SHORT TERM GOALS: Target date: 08/12/2022   Be independent in initial HEP Baseline: Goal status: MET  2.  Improve LE strength to perform sit to stand transition without use of legs on back of chair Baseline: less use of back of legs (08/10/22) Goal status: MET  3.  Perform 5x sit to stand in < or = to 17 seconds to reduce falls risk Baseline: 22.6 (08/10/22) Goal status: MET  4.  Ambulate short distances in clinic with normalized base of support and 50% reduced scissoring  Baseline: this varies but is less frequent (08/12/22) Goal status: MET   LONG TERM GOALS: Target date: 09/09/2022    Be independent in advanced  HEP Baseline:  Goal status: MET  2.  Perform sit to stand with min to no use of UE support due to improved LE strength Baseline:  Goal status: MET  3.  Perform TUG in < or = to 13 seconds without instability to reduce falls risk Baseline:  Goal status: NOT MET (however, this is without cane)  4.  Perform 5x sit to stand in < or = to 14 seconds to reduce falls risk Baseline:  Goal status: NOT MET(however, this is without UE support and with proper weight shift)  5.  Report no falls at home or in the community Baseline:  Goal status: MET    PLAN:  PT FREQUENCY: 2x/week  PT DURATION: 8 weeks  PLANNED INTERVENTIONS: Therapeutic exercises, Therapeutic activity, Neuromuscular re-education, Balance training, Gait training, Patient/Family education, Self Care, Joint mobilization, Aquatic Therapy, Cryotherapy, Moist heat, Manual therapy, and Re-evaluation  PLAN FOR NEXT SESSION: we will DC at this time due to max rehab potential met   Josy Peaden B. Suleika Donavan, PT 09/08/22 3:57 PM  [facility] [address] [place], [place] [address] Phone # [phone] Fax [phone]

## 2022-09-10 ENCOUNTER — Ambulatory Visit: Payer: Medicare Other | Admitting: Physical Therapy

## 2022-09-11 ENCOUNTER — Encounter: Payer: Medicare Other | Admitting: Internal Medicine

## 2022-09-11 DIAGNOSIS — J849 Interstitial pulmonary disease, unspecified: Secondary | ICD-10-CM

## 2022-09-11 DIAGNOSIS — Z006 Encounter for examination for normal comparison and control in clinical research program: Secondary | ICD-10-CM

## 2022-09-11 NOTE — Research (Signed)
Title: Chronic Fibrosing Interstitial Lung Disease with Progressive Phenotype Prospective Outcomes (ILD-PRO) Registry    Protocol #: IPF-PRO-SUB, Clinical Trials # [id number], Sponsor: [employer]   Protocol Version Amendment 4 dated 12Sep2019  and confirmed current on  Consent Version for today's visit date of  Is [place] IRB Approved Version 16 Jun 2018 Revised 16 Jun 2018   Objectives:  Describe current approaches to diagnosis and treatment of chronic fibrosing ILDs with progressive phenotype  Describe the natural history of chronic fibrosing ILDs with progressive phenotype  Assess quality of life from self-administered participant reported questionnaires for each disease group  Describe participant interactions with the healthcare system, describe treatment practices across multiple institutions for each disease group  Collect biological samples linked to well characterized chronic fibrosing ILDs with progressive phenotype to identify disease biomarkers  Collect data and biological samples that will support future research studies.                                            Key Inclusion Criteria: Willing and able to provide informed consent  Age ? 30 years  Diagnosis of a non-IPF ILD of any duration, including, but not limited to Idiopathic Non-Specific Interstitial Pneumonia (INSIP), Unclassifiable Idiopathic Interstitial Pneumonias (IIPs), Interstitial Pneumonia with Autoimmune Features (IPAF), Autoimmune ILDs such as Rheumatoid Arthritis (RA-ILD) and Systemic Sclerosis (SSC-ILD), Chronic Hypersensitivity Pneumonitis (HP), Sarcoidosis or Exposure-related ILDs such as asbestosis.  Chronic fibrosing ILD defined by reticular abnormality with traction bronchiectasis with or without honeycombing confirmed by chest HRCT scan and/or lung biopsy.  Progressive phenotype as defined by fulfilling at least one of the criteria below of fibrotic changes (progression set point)  within the last 24 months regardless of treatment considered appropriate in individual ILDs:  decline in FVC % predicted (% pred) based on >10% relative decline  decline in FVC % pred based on ? 5 - <10% relative decline in FVC combined with worsening of respiratory symptoms as assessed by the site investigator  decline in FVC % pred based on ? 5 - <10% relative decline in FVC combined with increasing extent of fibrotic changes on chest imaging (HRCT scan) as assessed by the site investigator  decline in DLCO % pred based on ? 10% relative decline  worsening of respiratory symptoms as well as increasing extent of fibrotic changes on chest imaging (HRCT scan) as assessed by the site investigator independent of FVC change.     Key Exclusion Criteria: Malignancy, treated or untreated, other than skin or early stage prostate cancer, within the past 5 years  Currently listed for lung transplantation at the time of enrollment  Currently enrolled in a clinical trial at the time of enrollment in this registry       Clinical Research Coordinator / Research RN note : This visit for [place] [id number] with DOB:September 01, 1940  on 01/Mar/2024  for the above protocol is Visit/Encounter 6 and is for purpose of research.    Subject expressed continued interest and consent in continuing as a study subject. Subject confirmed that there was no change in contact information (e.g. address, telephone, email). Subject thanked for participation in research and contribution to science.     During this visit on 01/Mar/2024, the subject completed the blood work and questionnaires per the above referenced protocol. Please refer to the subject's paper source binder for further details.  Signed by [place], [place] 01/Mar/2024  1:24pm

## 2022-09-12 ENCOUNTER — Other Ambulatory Visit: Payer: Self-pay | Admitting: Internal Medicine

## 2022-09-13 ENCOUNTER — Other Ambulatory Visit: Payer: Self-pay | Admitting: Internal Medicine

## 2022-09-21 DIAGNOSIS — E785 Hyperlipidemia, unspecified: Secondary | ICD-10-CM | POA: Diagnosis not present

## 2022-09-21 DIAGNOSIS — R7989 Other specified abnormal findings of blood chemistry: Secondary | ICD-10-CM | POA: Diagnosis not present

## 2022-09-21 DIAGNOSIS — M810 Age-related osteoporosis without current pathological fracture: Secondary | ICD-10-CM | POA: Diagnosis not present

## 2022-09-21 DIAGNOSIS — I10 Essential (primary) hypertension: Secondary | ICD-10-CM | POA: Diagnosis not present

## 2022-09-21 DIAGNOSIS — F419 Anxiety disorder, unspecified: Secondary | ICD-10-CM | POA: Diagnosis not present

## 2022-09-21 DIAGNOSIS — K219 Gastro-esophageal reflux disease without esophagitis: Secondary | ICD-10-CM | POA: Diagnosis not present

## 2022-10-01 DIAGNOSIS — E785 Hyperlipidemia, unspecified: Secondary | ICD-10-CM | POA: Diagnosis not present

## 2022-10-01 DIAGNOSIS — M0510 Rheumatoid lung disease with rheumatoid arthritis of unspecified site: Secondary | ICD-10-CM | POA: Diagnosis not present

## 2022-10-01 DIAGNOSIS — I700 Atherosclerosis of aorta: Secondary | ICD-10-CM | POA: Diagnosis not present

## 2022-10-01 DIAGNOSIS — D692 Other nonthrombocytopenic purpura: Secondary | ICD-10-CM | POA: Diagnosis not present

## 2022-10-01 DIAGNOSIS — M810 Age-related osteoporosis without current pathological fracture: Secondary | ICD-10-CM | POA: Diagnosis not present

## 2022-10-01 DIAGNOSIS — I771 Stricture of artery: Secondary | ICD-10-CM | POA: Diagnosis not present

## 2022-10-01 DIAGNOSIS — R42 Dizziness and giddiness: Secondary | ICD-10-CM | POA: Diagnosis not present

## 2022-10-01 DIAGNOSIS — Z1389 Encounter for screening for other disorder: Secondary | ICD-10-CM | POA: Diagnosis not present

## 2022-10-01 DIAGNOSIS — I10 Essential (primary) hypertension: Secondary | ICD-10-CM | POA: Diagnosis not present

## 2022-10-01 DIAGNOSIS — R82998 Other abnormal findings in urine: Secondary | ICD-10-CM | POA: Diagnosis not present

## 2022-10-01 DIAGNOSIS — J849 Interstitial pulmonary disease, unspecified: Secondary | ICD-10-CM | POA: Diagnosis not present

## 2022-10-01 DIAGNOSIS — Z23 Encounter for immunization: Secondary | ICD-10-CM | POA: Diagnosis not present

## 2022-10-01 DIAGNOSIS — Z1331 Encounter for screening for depression: Secondary | ICD-10-CM | POA: Diagnosis not present

## 2022-10-01 DIAGNOSIS — J449 Chronic obstructive pulmonary disease, unspecified: Secondary | ICD-10-CM | POA: Diagnosis not present

## 2022-10-01 DIAGNOSIS — Z0000 Encounter for general adult medical examination without abnormal findings: Secondary | ICD-10-CM | POA: Diagnosis not present

## 2022-10-01 DIAGNOSIS — D8989 Other specified disorders involving the immune mechanism, not elsewhere classified: Secondary | ICD-10-CM | POA: Diagnosis not present

## 2022-10-09 ENCOUNTER — Telehealth: Payer: Self-pay

## 2022-10-09 NOTE — Telephone Encounter (Signed)
PA renewal initiated automatically by CoverMyMeds.  Submitted a Prior Authorization request to [facility] for OFEV via CoverMyMeds. Will update once we receive a response.   Key: [id number]

## 2022-10-12 NOTE — Telephone Encounter (Signed)
Received notification from [facility] regarding a prior authorization for [place]. Authorization has been APPROVED from 10/09/2022 to 10/09/2023. Approval letter sent to scan center.  Authorization #  [id number]

## 2022-10-13 ENCOUNTER — Telehealth: Payer: Self-pay | Admitting: Internal Medicine

## 2022-10-13 ENCOUNTER — Other Ambulatory Visit (HOSPITAL_BASED_OUTPATIENT_CLINIC_OR_DEPARTMENT_OTHER): Payer: Self-pay | Admitting: Cardiology

## 2022-10-13 DIAGNOSIS — E7800 Pure hypercholesterolemia, unspecified: Secondary | ICD-10-CM

## 2022-10-13 NOTE — Telephone Encounter (Signed)
Pt and daughter in law Arbie Cookey called in bc prior authorization has been approved. They wanted to inform us of the approval. Informed Pt and daughter in law Arbie Cookey that we have received the Approval as well per prev telephone encounter on 10/09/2022

## 2022-10-14 NOTE — Telephone Encounter (Signed)
Rx request sent to pharmacy.  

## 2022-10-20 ENCOUNTER — Other Ambulatory Visit (HOSPITAL_COMMUNITY): Payer: Self-pay | Admitting: *Deleted

## 2022-10-21 ENCOUNTER — Ambulatory Visit (HOSPITAL_COMMUNITY)
Admission: RE | Admit: 2022-10-21 | Discharge: 2022-10-21 | Disposition: A | Discharge status: Home / Self Care | Payer: Medicare Other | Source: Ambulatory Visit | Attending: Internal Medicine | Admitting: Internal Medicine

## 2022-10-21 DIAGNOSIS — M810 Age-related osteoporosis without current pathological fracture: Secondary | ICD-10-CM | POA: Diagnosis not present

## 2022-10-21 MED ORDER — ZOLEDRONIC ACID 5 MG/100ML IV SOLN
INTRAVENOUS | Status: AC
  Administered 2022-10-21: 5 mg via INTRAVENOUS
  Filled 2022-10-21: qty 100

## 2022-10-21 MED ORDER — ZOLEDRONIC ACID 5 MG/100ML IV SOLN: 5.0000 mg | Freq: Once | INTRAVENOUS | Status: AC

## 2022-10-28 NOTE — Telephone Encounter (Signed)
Patient is calling to ask that the doctor or nurse contact [facility] [phone]) because she was told that they did not have the order for the medication.  Please advise and call patient with an update.  CB# [phone]

## 2022-10-29 ENCOUNTER — Other Ambulatory Visit: Payer: Self-pay | Admitting: Internal Medicine

## 2022-11-06 ENCOUNTER — Encounter: Payer: Self-pay | Admitting: Internal Medicine

## 2022-11-17 ENCOUNTER — Emergency Department (HOSPITAL_COMMUNITY): Payer: Medicare Other

## 2022-11-17 ENCOUNTER — Encounter (HOSPITAL_COMMUNITY): Payer: Self-pay

## 2022-11-17 ENCOUNTER — Inpatient Hospital Stay (HOSPITAL_COMMUNITY)
Admission: EM | Admit: 2022-11-17 | Discharge: 2022-11-20 | DRG: 194 | Disposition: A | Discharge status: Skilled Nursing Facility | Payer: Medicare Other | Attending: Family Medicine | Admitting: Family Medicine

## 2022-11-17 ENCOUNTER — Other Ambulatory Visit: Payer: Self-pay

## 2022-11-17 DIAGNOSIS — R2681 Unsteadiness on feet: Secondary | ICD-10-CM | POA: Diagnosis not present

## 2022-11-17 DIAGNOSIS — M069 Rheumatoid arthritis, unspecified: Secondary | ICD-10-CM | POA: Diagnosis not present

## 2022-11-17 DIAGNOSIS — J849 Interstitial pulmonary disease, unspecified: Secondary | ICD-10-CM | POA: Diagnosis not present

## 2022-11-17 DIAGNOSIS — R1110 Vomiting, unspecified: Secondary | ICD-10-CM | POA: Diagnosis not present

## 2022-11-17 DIAGNOSIS — R1084 Generalized abdominal pain: Secondary | ICD-10-CM | POA: Diagnosis not present

## 2022-11-17 DIAGNOSIS — E785 Hyperlipidemia, unspecified: Secondary | ICD-10-CM | POA: Diagnosis present

## 2022-11-17 DIAGNOSIS — I700 Atherosclerosis of aorta: Secondary | ICD-10-CM | POA: Diagnosis not present

## 2022-11-17 DIAGNOSIS — J45909 Unspecified asthma, uncomplicated: Secondary | ICD-10-CM | POA: Diagnosis present

## 2022-11-17 DIAGNOSIS — Z880 Allergy status to penicillin: Secondary | ICD-10-CM

## 2022-11-17 DIAGNOSIS — M8580 Other specified disorders of bone density and structure, unspecified site: Secondary | ICD-10-CM | POA: Diagnosis not present

## 2022-11-17 DIAGNOSIS — R0602 Shortness of breath: Secondary | ICD-10-CM | POA: Diagnosis not present

## 2022-11-17 DIAGNOSIS — Z9049 Acquired absence of other specified parts of digestive tract: Secondary | ICD-10-CM

## 2022-11-17 DIAGNOSIS — K5900 Constipation, unspecified: Secondary | ICD-10-CM | POA: Diagnosis present

## 2022-11-17 DIAGNOSIS — Z825 Family history of asthma and other chronic lower respiratory diseases: Secondary | ICD-10-CM

## 2022-11-17 DIAGNOSIS — R112 Nausea with vomiting, unspecified: Secondary | ICD-10-CM | POA: Diagnosis not present

## 2022-11-17 DIAGNOSIS — M810 Age-related osteoporosis without current pathological fracture: Secondary | ICD-10-CM | POA: Diagnosis present

## 2022-11-17 DIAGNOSIS — R5383 Other fatigue: Secondary | ICD-10-CM | POA: Diagnosis not present

## 2022-11-17 DIAGNOSIS — E860 Dehydration: Secondary | ICD-10-CM | POA: Diagnosis not present

## 2022-11-17 DIAGNOSIS — Z9981 Dependence on supplemental oxygen: Secondary | ICD-10-CM

## 2022-11-17 DIAGNOSIS — Z79899 Other long term (current) drug therapy: Secondary | ICD-10-CM | POA: Diagnosis not present

## 2022-11-17 DIAGNOSIS — J45998 Other asthma: Secondary | ICD-10-CM | POA: Diagnosis not present

## 2022-11-17 DIAGNOSIS — Z888 Allergy status to other drugs, medicaments and biological substances status: Secondary | ICD-10-CM

## 2022-11-17 DIAGNOSIS — E876 Hypokalemia: Secondary | ICD-10-CM | POA: Diagnosis present

## 2022-11-17 DIAGNOSIS — J45901 Unspecified asthma with (acute) exacerbation: Secondary | ICD-10-CM | POA: Diagnosis not present

## 2022-11-17 DIAGNOSIS — J9611 Chronic respiratory failure with hypoxia: Secondary | ICD-10-CM | POA: Diagnosis present

## 2022-11-17 DIAGNOSIS — J189 Pneumonia, unspecified organism: Secondary | ICD-10-CM | POA: Diagnosis not present

## 2022-11-17 DIAGNOSIS — Z803 Family history of malignant neoplasm of breast: Secondary | ICD-10-CM | POA: Diagnosis not present

## 2022-11-17 DIAGNOSIS — I10 Essential (primary) hypertension: Secondary | ICD-10-CM | POA: Diagnosis present

## 2022-11-17 DIAGNOSIS — R110 Nausea: Secondary | ICD-10-CM | POA: Diagnosis not present

## 2022-11-17 DIAGNOSIS — R109 Unspecified abdominal pain: Secondary | ICD-10-CM | POA: Diagnosis not present

## 2022-11-17 DIAGNOSIS — F329 Major depressive disorder, single episode, unspecified: Secondary | ICD-10-CM | POA: Diagnosis not present

## 2022-11-17 DIAGNOSIS — M1990 Unspecified osteoarthritis, unspecified site: Secondary | ICD-10-CM | POA: Diagnosis present

## 2022-11-17 DIAGNOSIS — M6281 Muscle weakness (generalized): Secondary | ICD-10-CM | POA: Diagnosis not present

## 2022-11-17 DIAGNOSIS — Z882 Allergy status to sulfonamides status: Secondary | ICD-10-CM | POA: Diagnosis not present

## 2022-11-17 DIAGNOSIS — R0902 Hypoxemia: Secondary | ICD-10-CM | POA: Diagnosis not present

## 2022-11-17 DIAGNOSIS — J984 Other disorders of lung: Secondary | ICD-10-CM | POA: Diagnosis not present

## 2022-11-17 DIAGNOSIS — B0229 Other postherpetic nervous system involvement: Secondary | ICD-10-CM | POA: Diagnosis not present

## 2022-11-17 DIAGNOSIS — R2689 Other abnormalities of gait and mobility: Secondary | ICD-10-CM | POA: Diagnosis not present

## 2022-11-17 DIAGNOSIS — Z7401 Bed confinement status: Secondary | ICD-10-CM | POA: Diagnosis not present

## 2022-11-17 DIAGNOSIS — K219 Gastro-esophageal reflux disease without esophagitis: Secondary | ICD-10-CM | POA: Diagnosis not present

## 2022-11-17 LAB — COMPREHENSIVE METABOLIC PANEL
ALT: 15 U/L (ref 0–44)
AST: 27 U/L (ref 15–41)
Albumin: 3.8 g/dL (ref 3.5–5.0)
Alkaline Phosphatase: 52 U/L (ref 38–126)
Anion gap: 10 (ref 5–15)
BUN: 13 mg/dL (ref 8–23)
CO2: 27 mmol/L (ref 22–32)
Calcium: 8.5 mg/dL — ABNORMAL LOW (ref 8.9–10.3)
Chloride: 98 mmol/L (ref 98–111)
Creatinine, Ser: 0.82 mg/dL (ref 0.44–1.00)
GFR, Estimated: >60 mL/min (ref >60)
Glucose, Bld: 105 mg/dL — ABNORMAL HIGH (ref 70–99)
Potassium: 3.6 mmol/L (ref 3.5–5.1)
Sodium: 135 mmol/L (ref 135–145)
Total Bilirubin: 0.6 mg/dL (ref 0.3–1.2)
Total Protein: 6.4 g/dL — ABNORMAL LOW (ref 6.5–8.1)

## 2022-11-17 LAB — CBC
HCT: 42.1 % (ref 36.0–46.0)
Hemoglobin: 13.7 g/dL (ref 12.0–15.0)
MCH: 30.3 pg (ref 26.0–34.0)
MCHC: 32.5 g/dL (ref 30.0–36.0)
MCV: 93.1 fL (ref 80.0–100.0)
Platelets: 224 10*3/uL (ref 150–400)
RBC: 4.52 MIL/uL (ref 3.87–5.11)
RDW: 13.5 % (ref 11.5–15.5)
WBC: 7.7 10*3/uL (ref 4.0–10.5)
nRBC: 0 % (ref 0.0–0.2)

## 2022-11-17 LAB — LIPASE, BLOOD: Lipase: 25 U/L (ref 11–51)

## 2022-11-17 LAB — TROPONIN I (HIGH SENSITIVITY)
Troponin I (High Sensitivity): 13 ng/L (ref <18)
Troponin I (High Sensitivity): 15 ng/L (ref <18)

## 2022-11-17 MED ORDER — ENOXAPARIN SODIUM 40 MG/0.4ML IJ SOSY
40.0000 mg | PREFILLED_SYRINGE | INTRAMUSCULAR | Status: DC
  Administered 2022-11-17 – 2022-11-19 (×3): 40 mg via SUBCUTANEOUS
  Filled 2022-11-17 (×3): qty 0.4

## 2022-11-17 MED ORDER — ONDANSETRON HCL 4 MG/2ML IJ SOLN
4.0000 mg | Freq: Four times a day (QID) | INTRAMUSCULAR | Status: DC | PRN
  Administered 2022-11-18 (×2): 4 mg via INTRAVENOUS
  Filled 2022-11-17 (×2): qty 2

## 2022-11-17 MED ORDER — SODIUM CHLORIDE 0.9 % IV BOLUS
1000.0000 mL | Freq: Once | INTRAVENOUS | Status: AC
  Administered 2022-11-17: 1000 mL via INTRAVENOUS

## 2022-11-17 MED ORDER — IOHEXOL 300 MG/ML  SOLN
80.0000 mL | Freq: Once | INTRAMUSCULAR | Status: AC | PRN
  Administered 2022-11-17: 80 mL via INTRAVENOUS

## 2022-11-17 MED ORDER — ONDANSETRON HCL 4 MG PO TABS: 4.0000 mg | ORAL_TABLET | Freq: Four times a day (QID) | ORAL | Status: DC | PRN

## 2022-11-17 MED ORDER — ENOXAPARIN SODIUM 40 MG/0.4ML IJ SOSY: 40.0000 mg | PREFILLED_SYRINGE | INTRAMUSCULAR | Status: DC

## 2022-11-17 MED ORDER — LACTATED RINGERS IV SOLN: INTRAVENOUS | Status: DC

## 2022-11-17 MED ORDER — ONDANSETRON HCL 4 MG/2ML IJ SOLN
4.0000 mg | Freq: Once | INTRAMUSCULAR | Status: AC | PRN
  Administered 2022-11-17: 4 mg via INTRAVENOUS
  Filled 2022-11-17: qty 2

## 2022-11-17 MED ORDER — ONDANSETRON HCL 4 MG/2ML IJ SOLN
4.0000 mg | Freq: Four times a day (QID) | INTRAMUSCULAR | Status: DC | PRN
  Administered 2022-11-17: 4 mg via INTRAVENOUS
  Filled 2022-11-17: qty 2

## 2022-11-17 NOTE — ED Notes (Signed)
Assisted with changing pt brief, linen and repositioned

## 2022-11-17 NOTE — ED Triage Notes (Signed)
Patient c/o nausea and vomiting since late yesterday.

## 2022-11-17 NOTE — ED Notes (Signed)
Pt. Attempted to go to the restroom and wasn't able too go

## 2022-11-17 NOTE — ED Provider Notes (Signed)
Emergency Department Provider Note   I have reviewed the triage vital signs and the nursing notes.   HISTORY  Chief Complaint Emesis   HPI Stacey [name] is a 82 y.o. 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  She notes often having nausea after taking her Ofev today symptoms were more extreme with 5-6 episodes of nonbloody emesis.  No diarrhea.  Denies any abdominal or chest pain. No fever. Has tried Zofran at home without relief. Notes some associated generalized weakness.    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 Respiratory: Positive shortness of breath. Gastrointestinal: No abdominal pain. Positive nausea and vomiting.  No diarrhea.  No constipation. Genitourinary: Negative for dysuria. Musculoskeletal: Negative for back pain. Skin: Negative for rash. Neurological: Negative for headaches.  ____________________________________________   PHYSICAL EXAM:  VITAL SIGNS: ED Triage Vitals  Enc Vitals Group     BP 11/17/22 1549 (!) 150/86     Pulse Rate 11/17/22 1549 67     Resp 11/17/22 1549 18     Temp 11/17/22 1549 99.4 F (37.4 C)     Temp Source 11/17/22 1549 Oral     SpO2 11/17/22 1542 95 %     Weight 11/17/22 1547 130 lb 1.1 oz (59 kg)     Height 11/17/22 1547 5\' 5"  (1.651 m)   Constitutional: Alert and oriented. Well appearing and in no acute distress. Eyes: Conjunctivae are normal.   Head: Atraumatic. Nose: No congestion/rhinnorhea. Mouth/Throat: Mucous membranes are moist.   Neck: No stridor.   Cardiovascular: Normal rate, regular rhythm. Good peripheral circulation. Grossly normal heart sounds.   Respiratory: Normal respiratory effort.  No retractions. Lungs CTAB. Gastrointestinal: Soft and nontender. No distention.  Musculoskeletal: No lower extremity tenderness nor edema. No gross deformities of extremities. Neurologic:  Normal speech and language. No gross focal neurologic deficits are appreciated.  Skin:  Skin is warm, dry and intact. No rash noted.   ____________________________________________   LABS (all labs ordered are listed, but only abnormal results are displayed)  Labs Reviewed  COMPREHENSIVE METABOLIC PANEL - Abnormal; Notable for the following components:      Result Value   Glucose, Bld 105 (*)    Calcium 8.5 (*)    Total Protein 6.4 (*)    All other components within normal limits  LIPASE, BLOOD  CBC  URINALYSIS, ROUTINE W REFLEX MICROSCOPIC  COMPREHENSIVE METABOLIC PANEL  CBC  TROPONIN I (HIGH SENSITIVITY)  TROPONIN I (HIGH SENSITIVITY)   ____________________________________________  EKG   EKG Interpretation  Date/Time:  Tuesday Nov 17 2022 17:13:24 EDT Ventricular Rate:  86 PR Interval:  161 QRS Duration: 97 QT Interval:  377 QTC Calculation: 451 R Axis:   11 Text Interpretation: Normal sinus rhythm Borderline low voltage, extremity leads Confirmed by Alona Bene [phone]) on 11/18/2022 12:15:01 AM  ____________________________________________  RADIOLOGY  CT ABDOMEN PELVIS W CONTRAST  Result Date: 11/17/2022 CLINICAL DATA:  Acute abdominal pain and vomiting. EXAM: CT ABDOMEN AND PELVIS WITH CONTRAST TECHNIQUE: Multidetector CT imaging of the abdomen and pelvis was performed using the standard protocol following bolus administration of intravenous contrast. RADIATION DOSE REDUCTION: This exam was performed according  to the departmental dose-optimization program which includes automated exposure control, adjustment of the mA and/or kV according to patient size and/or use of iterative reconstruction technique. CONTRAST:  80mL OMNIPAQUE IOHEXOL 300 MG/ML  SOLN COMPARISON:  CT abdomen and pelvis 07/03/2019. CT chest high-resolution 03/04/2020 FINDINGS: Lower chest: Peripheral interstitial and ground-glass opacities are seen in both lung bases, increased from prior. Hepatobiliary: No focal liver abnormality is seen. Status post cholecystectomy. No biliary dilatation. Pancreas: Unremarkable. No pancreatic ductal dilatation or surrounding inflammatory changes. Spleen: Normal in size without focal abnormality. Adrenals/Urinary Tract: Adrenal glands are unremarkable. Kidneys are normal, without renal calculi, focal lesion, or hydronephrosis. Bladder is unremarkable. Stomach/Bowel: There is a small hiatal hernia. Stomach is otherwise within normal limits. Appendix appears normal. No evidence of bowel wall thickening, distention, or inflammatory changes. There is a large amount of stool throughout the colon. Vascular/Lymphatic: Aortic atherosclerosis. No enlarged abdominal or pelvic lymph nodes. Reproductive: Uterus and bilateral adnexa are unremarkable. Other: No abdominal wall hernia or abnormality. No abdominopelvic ascites. Musculoskeletal: No acute or significant osseous findings. IMPRESSION: 1. No acute process in the abdomen or pelvis. 2. Large amount of stool throughout the colon. 3. Small hiatal hernia. 4. Peripheral interstitial and ground-glass opacities in both lung bases, increased from prior. Findings are favored to represent sequela of chronic interstitial lung disease. Aortic Atherosclerosis (ICD10-I70.0). Electronically Signed   By: Darliss Cheney M.D.   On: 11/17/2022 18:32   DG Chest Portable 1 View  Result Date: 11/17/2022 CLINICAL DATA:  Shortness of breath EXAM: PORTABLE CHEST 1 VIEW COMPARISON:  X-ray 01/01/2022  FINDINGS: Stable cardiopericardial silhouette. Calcified aorta. Osteopenia with scattered degenerative changes. Diffuse interstitial changes seen of the lungs, likely chronic. There is more focal opacity liver seen at the right lung base. A superimposed acute process is possible. No pneumothorax or effusion. Surgical clips in the right upper quadrant of the abdomen. IMPRESSION: Extensive interstitial changes, likely chronic but there is more focal opacity in the right lung base. An acute process is not excluded. Recommend follow-up. Electronically Signed   By: Karen Kays M.D.   On: 11/17/2022 17:13    ____________________________________________   PROCEDURES  Procedure(s) performed:   Procedures  None  ____________________________________________   INITIAL IMPRESSION / ASSESSMENT AND PLAN / ED COURSE  Pertinent labs & imaging results that were available during my care of the patient were reviewed by me and considered in my medical decision making (see chart for details).   This patient is Presenting for Evaluation of nausea/vomiting, which does require a range of treatment options, and is a complaint that involves a high risk of morbidity and mortality.  The Differential Diagnoses includes but is not exclusive to acute cholecystitis, intrathoracic causes for epigastric abdominal pain, gastritis, duodenitis, pancreatitis, small bowel or large bowel obstruction, abdominal aortic aneurysm, hernia, gastritis, etc.   Critical Interventions-    Medications  lactated ringers infusion ( Intravenous New Bag/Given 11/17/22 2135)  enoxaparin (LOVENOX) injection 40 mg (40 mg Subcutaneous Given 11/17/22 2135)  ondansetron (ZOFRAN) tablet 4 mg (has no administration in time range)    Or  ondansetron (ZOFRAN) injection 4 mg (has no administration in time  range)  ondansetron (ZOFRAN) injection 4 mg (4 mg Intravenous Given 11/17/22 1601)  sodium chloride 0.9 % bolus 1,000 mL (0 mLs Intravenous Stopped  11/17/22 1922)  iohexol (OMNIPAQUE) 300 MG/ML solution 80 mL (80 mLs Intravenous Contrast Given 11/17/22 1805)    Reassessment after intervention: symptoms improved.    I did obtain Additional Historical Information from son at bedside.   I decided to review pertinent External Data, and in summary no recent ED visits for similar.   Clinical Laboratory Tests Ordered, included CMP without AKI. Normal LFTs. Normal CBC. Normal troponin.   Radiologic Tests Ordered, included CXR and CT abdomen/pelvis. I independently interpreted the images and agree with radiology interpretation.   Cardiac Monitor Tracing which shows NSR.    Social Determinants of Health Risk no smoking history.   Medical Decision Making: Summary:  Patient presents to the emergency department for evaluation of nausea and vomiting.  She has some subjective shortness of breath with interstitial lung disease history.  No chest or abdominal pain.  Given age and exam plan for CT abdomen/pelvis and reassess.   Reevaluation with update and discussion with patient. Continues to have some nausea and vomiting. Plan for admit for IV hydration and nausea mgmt.   Patient's presentation is most consistent with acute presentation with potential threat to life or bodily function.   Disposition: admit  ____________________________________________  FINAL CLINICAL IMPRESSION(S) / ED DIAGNOSES  Final diagnoses:  Nausea and vomiting, unspecified vomiting type  Dehydration     Note:  This document was prepared using Dragon voice recognition software and may include unintentional dictation errors.  Alona Bene, MD, [facility] Emergency Medicine    Kanye Depree, Arlyss Repress, MD 11/18/22 [phone]

## 2022-11-17 NOTE — H&P (Signed)
History and Physical    Patient: Stacey [name] ZOX:[id number] DOB: January 16, 1941 DOA: 11/17/2022 DOS: the patient was seen and examined on 11/17/2022 PCP: [name], MD  Patient coming from: Home  Chief Complaint:  Chief Complaint  Patient presents with   Emesis   HPI: CURTRINA SUH is a 82 y.o. female with medical history significant of interstitial lung disease, osteoarthritis, rheumatoid arthritis, asthma, essential hypertension, who is currently on Ofeb that patient attributed to causing nausea.  She usually takes it with Zofran.  Last dose was yesterday.  Today however she continues to have significant vomiting.  She has persistently vomited when in the ER.  Denied any fever or chills.  Denied any melena.  Denied any hematemesis.  Patient has no sick contacts.  No associated diarrhea.  No abdominal pain.  Patient is unable to keep anything down and she is starting to get dehydrated.  She is therefore being admitted to the hospital for symptomatic management.  Review of Systems: As mentioned in the history of present illness. All other systems reviewed and are negative. 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 She has never used smokeless  tobacco. She reports that she does not drink alcohol and does not use drugs.  Allergies  Allergen Reactions   Penicillins Other (See Comments)    Reaction unknown occurred during childhood Has patient had a PCN reaction causing immediate rash, facial/tongue/throat swelling, SOB or lightheadedness with hypotension: Unknown Has patient had a PCN reaction causing severe rash involving mucus membranes or skin necrosis: Unknown Has patient had a PCN reaction that required hospitalization: No Has patient had a PCN reaction occurring within the last 10 years: No If all of the above answers are "NO", then may proceed with Cephalosporin use.  Unsur   Remicade [Infliximab] Other (See Comments)    Reaction unknown   Sulfa Antibiotics Other (See Comments)    Reaction unknown    Family History  Problem Relation Age of Onset   Asthma Mother    Anemia Mother    Polymyalgia rheumatica Mother    COPD Father    Pulmonary fibrosis Father    Breast cancer Maternal Grandmother 51    Prior to Admission medications   Medication Sig Start Date End Date Taking? Authorizing Provider  amitriptyline (ELAVIL) 25 MG tablet Take 25 mg by mouth at bedtime. 02/04/15   [provider]  Biotin 1000 MCG tablet Take 1,000 mcg by mouth daily.    [provider]  BREO ELLIPTA 100-25 MCG/ACT AEPB Inhale 1 puff into the lungs daily. 09/14/22  Kalman Shan, MD  cholecalciferol (VITAMIN D) 1000 UNITS tablet Take 1,000 Units by mouth daily.    [provider]  gabapentin (NEURONTIN) 300 MG capsule Take 300 mg by mouth at bedtime.    [provider]  losartan (COZAAR) 25 MG tablet Take 50 mg by mouth daily. 02/04/21   [provider]  methylPREDNISolone (MEDROL) 4 MG tablet Take 1 tablet (4 mg total) by mouth every other day. Alternate with the 2mg  dose Patient taking differently: Take 4 mg by mouth daily. 06/22/19   Lonia Blood, MD  OFEV 100 MG CAPS TAKE 1 CAPSULE BY  MOUTH TWICE DAILY 10/30/22   Kalman Shan, MD  ondansetron (ZOFRAN) 4 MG tablet Take 1 tablet (4 mg total) by mouth every 8 (eight) hours as needed for nausea or vomiting. 09/14/22   Kalman Shan, MD  pantoprazole (PROTONIX) 40 MG tablet Take 1 tablet (40 mg total) by mouth 2 (two) times daily before a meal. 05/09/19   Ollis, Brandi L, NP  polyethylene glycol (MIRALAX / GLYCOLAX) 17 g packet Take 17 g by mouth as needed.    [provider]  pravastatin (PRAVACHOL) 20 MG tablet Take 1 tablet (20 mg total) by mouth daily. 10/14/22   Jodelle Red, MD  riTUXimab (RITUXAN) 100 MG/10ML injection Inject 100 mg into the vein every 6 (six) months.     [provider]  sulfamethoxazole-trimethoprim (BACTRIM DS) 800-160 MG tablet TAKE (1) TABLET ON MONDAY, WEDNESDAY AND FRIDAY. 06/11/22   Kalman Shan, MD  venlafaxine XR (EFFEXOR-XR) 75 MG 24 hr capsule Take 75 mg by mouth at bedtime.     [provider]    Physical Exam: Vitals:   11/17/22 1600 11/17/22 1745 11/17/22 1750 11/17/22 1851  BP:    (!) 169/71  Pulse:    77  Resp:    16  Temp:    99.1 F (37.3 C)  TempSrc:    Oral  SpO2: 94% (!) 88% 100% 100%  Weight:      Height:       Constitutional: Acutely ill looking, no distress NAD, calm, comfortable Eyes: PERRL, lids and conjunctivae normal ENMT: Mucous membranes are moist. Posterior pharynx clear of any exudate or lesions.Normal dentition.  Neck: normal, supple, no masses, no thyromegaly Respiratory: Good air entry bilaterally but has mild to moderate expiratory wheezing and crackles,  Normal respiratory effort. No accessory muscle use.  Cardiovascular: Regular rate and rhythm, no murmurs / rubs / gallops. No extremity edema. 2+ pedal pulses. No carotid bruits.  Abdomen: no tenderness, no masses palpated. No hepatosplenomegaly. Bowel sounds positive.  Musculoskeletal: Good range of motion, no joint swelling or tenderness, Skin: no rashes,  lesions, ulcers. No induration Neurologic: CN 2-12 grossly intact. Sensation intact, DTR normal. Strength 5/5 in all 4.  Psychiatric: Normal judgment and insight. Alert and oriented x 3. Normal mood  Data Reviewed:  Oxygen sat 88% on room air,Glucose 105, calcium 8.5.  CT abdomen pelvis showed no acute process.  She has a large amount of stool throughout the colon.  Chest x-ray showed extensive interstitial changes most likely chronic.  Assessment and Plan:  #1 intractable nausea with vomiting: Suspected due to drug reaction especially Ofeb.  It could also be some viral gastroenteritis.  At this point patient will be admitted and be symptomatically managed.  She usually takes Zofran at home we will continue.  IV hydration.  Close monitoring until symptoms resolve.  Hold suspected offending medications.  #2 dehydration: Will  hydrate.  #3 interstitial lung disease: Patient has mild hypoxia on room air.  Continue with oxygen.  Breathing treatment.  #4 Rheumatoid arthritis: Continue home regimen.    Advance Care Planning:   Code Status: Full Code   Consults: None  Family Communication: Daughter-in-law at bedside  Severity of Illness: The appropriate patient status for this patient is OBSERVATION. Observation status is judged to be reasonable and necessary in order to provide the required intensity of service to ensure the patient's safety. The patient's presenting symptoms, physical exam findings, and initial radiographic and laboratory data in the context of their medical condition is felt to place them at decreased risk for further clinical deterioration. Furthermore, it is anticipated that the patient will be medically stable for discharge from the hospital within 2 midnights of admission.   AuthorLonia Blood, MD 11/17/2022 7:51 PM  For on call review [email or web address].

## 2022-11-17 NOTE — ED Notes (Signed)
Gave ice chips

## 2022-11-18 ENCOUNTER — Telehealth: Payer: Self-pay | Admitting: Internal Medicine

## 2022-11-18 DIAGNOSIS — M810 Age-related osteoporosis without current pathological fracture: Secondary | ICD-10-CM | POA: Diagnosis present

## 2022-11-18 DIAGNOSIS — Z9049 Acquired absence of other specified parts of digestive tract: Secondary | ICD-10-CM | POA: Diagnosis not present

## 2022-11-18 DIAGNOSIS — B0229 Other postherpetic nervous system involvement: Secondary | ICD-10-CM | POA: Diagnosis present

## 2022-11-18 DIAGNOSIS — K219 Gastro-esophageal reflux disease without esophagitis: Secondary | ICD-10-CM | POA: Diagnosis not present

## 2022-11-18 DIAGNOSIS — Z7401 Bed confinement status: Secondary | ICD-10-CM | POA: Diagnosis not present

## 2022-11-18 DIAGNOSIS — Z79899 Other long term (current) drug therapy: Secondary | ICD-10-CM | POA: Diagnosis not present

## 2022-11-18 DIAGNOSIS — I10 Essential (primary) hypertension: Secondary | ICD-10-CM | POA: Diagnosis present

## 2022-11-18 DIAGNOSIS — Z9981 Dependence on supplemental oxygen: Secondary | ICD-10-CM | POA: Diagnosis not present

## 2022-11-18 DIAGNOSIS — J45909 Unspecified asthma, uncomplicated: Secondary | ICD-10-CM | POA: Diagnosis present

## 2022-11-18 DIAGNOSIS — R5383 Other fatigue: Secondary | ICD-10-CM | POA: Diagnosis not present

## 2022-11-18 DIAGNOSIS — R112 Nausea with vomiting, unspecified: Secondary | ICD-10-CM | POA: Diagnosis present

## 2022-11-18 DIAGNOSIS — K5900 Constipation, unspecified: Secondary | ICD-10-CM | POA: Diagnosis present

## 2022-11-18 DIAGNOSIS — J189 Pneumonia, unspecified organism: Secondary | ICD-10-CM | POA: Diagnosis present

## 2022-11-18 DIAGNOSIS — Z882 Allergy status to sulfonamides status: Secondary | ICD-10-CM | POA: Diagnosis not present

## 2022-11-18 DIAGNOSIS — R2689 Other abnormalities of gait and mobility: Secondary | ICD-10-CM | POA: Diagnosis not present

## 2022-11-18 DIAGNOSIS — M8580 Other specified disorders of bone density and structure, unspecified site: Secondary | ICD-10-CM | POA: Diagnosis not present

## 2022-11-18 DIAGNOSIS — M1990 Unspecified osteoarthritis, unspecified site: Secondary | ICD-10-CM | POA: Diagnosis present

## 2022-11-18 DIAGNOSIS — Z803 Family history of malignant neoplasm of breast: Secondary | ICD-10-CM | POA: Diagnosis not present

## 2022-11-18 DIAGNOSIS — E785 Hyperlipidemia, unspecified: Secondary | ICD-10-CM | POA: Diagnosis present

## 2022-11-18 DIAGNOSIS — J984 Other disorders of lung: Secondary | ICD-10-CM | POA: Diagnosis present

## 2022-11-18 DIAGNOSIS — Z825 Family history of asthma and other chronic lower respiratory diseases: Secondary | ICD-10-CM | POA: Diagnosis not present

## 2022-11-18 DIAGNOSIS — M6281 Muscle weakness (generalized): Secondary | ICD-10-CM | POA: Diagnosis not present

## 2022-11-18 DIAGNOSIS — E860 Dehydration: Secondary | ICD-10-CM | POA: Diagnosis present

## 2022-11-18 DIAGNOSIS — J9611 Chronic respiratory failure with hypoxia: Secondary | ICD-10-CM | POA: Diagnosis present

## 2022-11-18 DIAGNOSIS — J849 Interstitial pulmonary disease, unspecified: Secondary | ICD-10-CM | POA: Diagnosis not present

## 2022-11-18 DIAGNOSIS — M069 Rheumatoid arthritis, unspecified: Secondary | ICD-10-CM | POA: Diagnosis present

## 2022-11-18 DIAGNOSIS — J45998 Other asthma: Secondary | ICD-10-CM | POA: Diagnosis not present

## 2022-11-18 DIAGNOSIS — F329 Major depressive disorder, single episode, unspecified: Secondary | ICD-10-CM | POA: Diagnosis not present

## 2022-11-18 DIAGNOSIS — R2681 Unsteadiness on feet: Secondary | ICD-10-CM | POA: Diagnosis not present

## 2022-11-18 DIAGNOSIS — Z888 Allergy status to other drugs, medicaments and biological substances status: Secondary | ICD-10-CM | POA: Diagnosis not present

## 2022-11-18 DIAGNOSIS — J45901 Unspecified asthma with (acute) exacerbation: Secondary | ICD-10-CM | POA: Diagnosis not present

## 2022-11-18 DIAGNOSIS — Z880 Allergy status to penicillin: Secondary | ICD-10-CM | POA: Diagnosis not present

## 2022-11-18 DIAGNOSIS — E876 Hypokalemia: Secondary | ICD-10-CM | POA: Diagnosis present

## 2022-11-18 LAB — CBC
HCT: 37.8 % (ref 36.0–46.0)
Hemoglobin: 12 g/dL (ref 12.0–15.0)
MCH: 30 pg (ref 26.0–34.0)
MCHC: 31.7 g/dL (ref 30.0–36.0)
MCV: 94.5 fL (ref 80.0–100.0)
Platelets: 181 10*3/uL (ref 150–400)
RBC: 4 MIL/uL (ref 3.87–5.11)
RDW: 13.6 % (ref 11.5–15.5)
WBC: 6.8 10*3/uL (ref 4.0–10.5)
nRBC: 0 % (ref 0.0–0.2)

## 2022-11-18 LAB — COMPREHENSIVE METABOLIC PANEL
ALT: 12 U/L (ref 0–44)
AST: 23 U/L (ref 15–41)
Albumin: 2.7 g/dL — ABNORMAL LOW (ref 3.5–5.0)
Alkaline Phosphatase: 40 U/L (ref 38–126)
Anion gap: 8 (ref 5–15)
BUN: 10 mg/dL (ref 8–23)
CO2: 21 mmol/L — ABNORMAL LOW (ref 22–32)
Calcium: 6.8 mg/dL — ABNORMAL LOW (ref 8.9–10.3)
Chloride: 104 mmol/L (ref 98–111)
Creatinine, Ser: 0.61 mg/dL (ref 0.44–1.00)
GFR, Estimated: >60 mL/min (ref >60)
Glucose, Bld: 78 mg/dL (ref 70–99)
Potassium: 3.2 mmol/L — ABNORMAL LOW (ref 3.5–5.1)
Sodium: 133 mmol/L — ABNORMAL LOW (ref 135–145)
Total Bilirubin: 0.7 mg/dL (ref 0.3–1.2)
Total Protein: 5 g/dL — ABNORMAL LOW (ref 6.5–8.1)

## 2022-11-18 MED ORDER — SENNOSIDES-DOCUSATE SODIUM 8.6-50 MG PO TABS
1.0000 | ORAL_TABLET | Freq: Two times a day (BID) | ORAL | Status: DC
  Administered 2022-11-18 – 2022-11-20 (×4): 1 via ORAL
  Filled 2022-11-18 (×5): qty 1

## 2022-11-18 MED ORDER — VENLAFAXINE HCL ER 75 MG PO CP24
75.0000 mg | ORAL_CAPSULE | Freq: Every day | ORAL | Status: DC
  Administered 2022-11-18 – 2022-11-19 (×2): 75 mg via ORAL
  Filled 2022-11-18 (×3): qty 1

## 2022-11-18 MED ORDER — POLYETHYLENE GLYCOL 3350 17 G PO PACK
17.0000 g | PACK | Freq: Every day | ORAL | Status: DC
  Administered 2022-11-18 – 2022-11-20 (×2): 17 g via ORAL
  Filled 2022-11-18 (×3): qty 1

## 2022-11-18 MED ORDER — GUAIFENESIN ER 600 MG PO TB12: 600.0000 mg | ORAL_TABLET | Freq: Two times a day (BID) | ORAL | Status: DC

## 2022-11-18 MED ORDER — AZITHROMYCIN 500 MG PO TABS
500.0000 mg | ORAL_TABLET | Freq: Every day | ORAL | Status: DC
  Administered 2022-11-18 – 2022-11-20 (×3): 500 mg via ORAL
  Filled 2022-11-18: qty 1
  Filled 2022-11-18: qty 2
  Filled 2022-11-18: qty 1

## 2022-11-18 MED ORDER — FLUTICASONE FUROATE-VILANTEROL 100-25 MCG/ACT IN AEPB
1.0000 | INHALATION_SPRAY | Freq: Every day | RESPIRATORY_TRACT | Status: DC
  Administered 2022-11-19: 1 via RESPIRATORY_TRACT
  Filled 2022-11-18: qty 28

## 2022-11-18 MED ORDER — GUAIFENESIN 100 MG/5ML PO LIQD
5.0000 mL | ORAL | Status: DC | PRN
  Filled 2022-11-18: qty 15

## 2022-11-18 MED ORDER — GUAIFENESIN-DM 100-10 MG/5ML PO SYRP
10.0000 mL | ORAL_SOLUTION | ORAL | Status: DC | PRN
  Administered 2022-11-20: 10 mL via ORAL
  Filled 2022-11-18 (×2): qty 10

## 2022-11-18 MED ORDER — PANTOPRAZOLE SODIUM 40 MG PO TBEC
40.0000 mg | DELAYED_RELEASE_TABLET | Freq: Two times a day (BID) | ORAL | Status: DC
  Administered 2022-11-18 – 2022-11-20 (×5): 40 mg via ORAL
  Filled 2022-11-18 (×5): qty 1

## 2022-11-18 MED ORDER — CALCIUM GLUCONATE-NACL 1-0.675 GM/50ML-% IV SOLN
1.0000 g | Freq: Once | INTRAVENOUS | Status: AC
  Administered 2022-11-18: 1000 mg via INTRAVENOUS
  Filled 2022-11-18: qty 50

## 2022-11-18 MED ORDER — POTASSIUM CHLORIDE CRYS ER 20 MEQ PO TBCR
40.0000 meq | EXTENDED_RELEASE_TABLET | Freq: Once | ORAL | Status: AC
  Administered 2022-11-18: 40 meq via ORAL
  Filled 2022-11-18: qty 2

## 2022-11-18 MED ORDER — LOSARTAN POTASSIUM 50 MG PO TABS
25.0000 mg | ORAL_TABLET | Freq: Every day | ORAL | Status: DC
  Administered 2022-11-18 – 2022-11-20 (×3): 25 mg via ORAL
  Filled 2022-11-18 (×3): qty 1

## 2022-11-18 MED ORDER — IPRATROPIUM-ALBUTEROL 0.5-2.5 (3) MG/3ML IN SOLN
3.0000 mL | Freq: Four times a day (QID) | RESPIRATORY_TRACT | Status: DC | PRN
  Administered 2022-11-18 – 2022-11-20 (×2): 3 mL via RESPIRATORY_TRACT
  Filled 2022-11-18 (×3): qty 3

## 2022-11-18 MED ORDER — SULFAMETHOXAZOLE-TRIMETHOPRIM 800-160 MG PO TABS: 1.0000 | ORAL_TABLET | ORAL | Status: DC

## 2022-11-18 MED ORDER — GABAPENTIN 300 MG PO CAPS
600.0000 mg | ORAL_CAPSULE | Freq: Every day | ORAL | Status: DC
  Administered 2022-11-18 – 2022-11-19 (×2): 600 mg via ORAL
  Filled 2022-11-18 (×2): qty 2

## 2022-11-18 MED ORDER — METHYLPREDNISOLONE SODIUM SUCC 125 MG IJ SOLR
40.0000 mg | Freq: Two times a day (BID) | INTRAMUSCULAR | Status: DC
  Administered 2022-11-18 – 2022-11-20 (×4): 40 mg via INTRAVENOUS
  Filled 2022-11-18 (×4): qty 2

## 2022-11-18 MED ORDER — HYDRALAZINE HCL 20 MG/ML IJ SOLN: 10.0000 mg | Freq: Four times a day (QID) | INTRAMUSCULAR | Status: DC | PRN

## 2022-11-18 MED ORDER — DEXAMETHASONE 4 MG PO TABS
4.0000 mg | ORAL_TABLET | Freq: Every day | ORAL | Status: DC
  Filled 2022-11-18: qty 1

## 2022-11-18 MED ORDER — SODIUM CHLORIDE 0.9 % IV SOLN
2.0000 g | INTRAVENOUS | Status: DC
  Administered 2022-11-18 – 2022-11-20 (×3): 2 g via INTRAVENOUS
  Filled 2022-11-18 (×3): qty 20

## 2022-11-18 MED ORDER — ACETAMINOPHEN 325 MG PO TABS
650.0000 mg | ORAL_TABLET | Freq: Once | ORAL | Status: AC
  Administered 2022-11-18: 650 mg via ORAL
  Filled 2022-11-18: qty 2

## 2022-11-18 MED ORDER — PRAVASTATIN SODIUM 40 MG PO TABS
20.0000 mg | ORAL_TABLET | Freq: Every day | ORAL | Status: DC
  Administered 2022-11-18 – 2022-11-19 (×2): 20 mg via ORAL
  Filled 2022-11-18 (×2): qty 1

## 2022-11-18 NOTE — Progress Notes (Signed)
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  Patient admitted for the management of intractable nausea and vomiting.  Hospital course also remarkable for fever.  Started on antibiotics, cultures sent.  Patient transferred to [facility] because there were no beds available at [facility] & Plan:  Principal Problem:   Nausea & vomiting Active Problems:   Rheumatoid arthritis (HCC)   Osteopenia   Restrictive lung disease   Intractable nausea and vomiting: Unclear etiology but suspected to be due to drug reaction especially OFEV.  Continue antiemetics as needed.  Nausea and vomiting have resolved this morning.  I doubt that her nausea and vomiting was from ofeb because she was taking this medication for several years. No abdominal pain.  Abdominal examination was benign on examination this morning. I discussed with Dr. Monica Becton, who sees her for ILD.  He also thinks that is unlikely from [facility] and  he recommended holding it for 2 weeks  Suspected community-acquired pneumonia: Chest imaging also shows right lung base opacity, could not rule out pneumonia.  She had a fever of 101 in the early morning of 5/8.  Started on ceftriaxone, azithromycin.  Blood culture sent.  Afebrile during my evaluation today.She is having some  cough  Interstitial lung disease: on 2 L of oxygen per minute on admission.  Now on room air.  She uses 2 L of oxygen at home when needed.  Continue bronchodilators as needed. She was found to be wheezing this morning.  She takes dexamethasone 4 mg every day at home for RA,I will start her on Solu-Medrol.  History of rheumatoid arthritis: Follows with rheumatology at [facility].  On  dexamethasone at home.  Hypokalemia: Supplemented with  potassium  Hypocalcemia: Supplemented  Hypertension: Takes losartan at home.  Continue as needed medication for severe hypertension.  Hyperlipidemia: On Lipitor at home  History of depression: On Effexor  Constipation: Continue bowel regimen.  Weakness: Patient lives alone,son is nearby  PT consulted       DVT prophylaxis:enoxaparin (LOVENOX) injection 40 mg Start: 11/17/22 2200     Code Status: Full Code  Family Communication: Called and discussed with son on phoneon 5/8  Patient status:Inpatient  Patient is from :Home  Anticipated discharge WJ:XBJY  Estimated DC date:1-2 days   Consultants: None  Procedures:None  Antimicrobials:  Anti-infectives (From admission, onward)    None       Subjective:  Patient seen and examined at the bedside today.  Hemodynamically stable during my evaluation.  She was on 2 L of oxygen during my evaluation which I weaned to room air.  She denied any nausea, vomiting or abdomen pain during my evaluation today.  She had a fever of 101.4 early this morning but afebrile during my evaluation.  She denies any shortness of breath or chest pain.  Later on I was notified that she had some wheezing.  Objective: Vitals:   11/18/22 0500 11/18/22 0600 11/18/22 0627 11/18/22 0700  BP: (!) 125/92 (!) 160/62  (!) 146/72  Pulse: 78 83  80  Resp: (!) 26 19  10   Temp:   98.8 F (37.1 C)   TempSrc:   Oral   SpO2: 90% 94%  96%  Weight:      Height:  Intake/Output Summary (Last 24 hours) at 11/18/2022 1478 Last data filed at 11/17/2022 1922 Gross per 24 hour  Intake 1000 ml  Output --  Net 1000 ml   Filed Weights   11/17/22 1547  Weight: 59 kg    Examination:  General exam: Overall comfortable, not in distress, pleasant elderly female HEENT: PERRL Respiratory system: Mildly diminished sounds , mild bibasilar crackles Cardiovascular system: S1 & S2 heard, RRR.   Gastrointestinal system: Abdomen is nondistended, soft and nontender. Central nervous system: Alert and oriented Extremities: No edema, no clubbing ,no cyanosis Skin: No rashes, no ulcers,no icterus     Data Reviewed: I have personally reviewed following labs and imaging studies  CBC: Recent Labs  Lab 11/17/22 1552 11/18/22 0443  WBC 7.7 6.8  HGB 13.7 12.0  HCT 42.1 37.8  MCV 93.1 94.5  PLT 224 181   Basic Metabolic Panel: Recent Labs  Lab 11/17/22 1552 11/18/22 0443  NA 135 133*  K 3.6 3.2*  CL 98 104  CO2 27 21*  GLUCOSE 105* 78  BUN 13 10  CREATININE 0.82 0.61  CALCIUM 8.5* 6.8*     No results found for this or any previous visit (from the past 240 hour(s)).   Radiology Studies: CT ABDOMEN PELVIS W CONTRAST  Result Date: 11/17/2022 CLINICAL DATA:  Acute abdominal pain and vomiting. EXAM: CT ABDOMEN AND PELVIS WITH CONTRAST TECHNIQUE: Multidetector CT imaging of the abdomen and pelvis was performed using the standard protocol following bolus administration of intravenous contrast. RADIATION DOSE REDUCTION: This exam was performed according to the departmental dose-optimization program which includes automated exposure control, adjustment of the mA and/or kV according to patient size and/or use of iterative reconstruction technique. CONTRAST:  80mL OMNIPAQUE IOHEXOL 300 MG/ML  SOLN COMPARISON:  CT abdomen and pelvis 07/03/2019. CT chest high-resolution 03/04/2020 FINDINGS: Lower chest: Peripheral interstitial and ground-glass opacities are seen in both lung bases, increased from prior. Hepatobiliary: No focal liver abnormality is seen. Status post cholecystectomy. No biliary dilatation. Pancreas: Unremarkable. No pancreatic ductal dilatation or surrounding inflammatory changes. Spleen: Normal in size without focal abnormality. Adrenals/Urinary Tract: Adrenal glands are unremarkable. Kidneys are normal, without renal calculi, focal lesion, or hydronephrosis. Bladder is  unremarkable. Stomach/Bowel: There is a small hiatal hernia. Stomach is otherwise within normal limits. Appendix appears normal. No evidence of bowel wall thickening, distention, or inflammatory changes. There is a large amount of stool throughout the colon. Vascular/Lymphatic: Aortic atherosclerosis. No enlarged abdominal or pelvic lymph nodes. Reproductive: Uterus and bilateral adnexa are unremarkable. Other: No abdominal wall hernia or abnormality. No abdominopelvic ascites. Musculoskeletal: No acute or significant osseous findings. IMPRESSION: 1. No acute process in the abdomen or pelvis. 2. Large amount of stool throughout the colon. 3. Small hiatal hernia. 4. Peripheral interstitial and ground-glass opacities in both lung bases, increased from prior. Findings are favored to represent sequela of chronic interstitial lung disease. Aortic Atherosclerosis (ICD10-I70.0). Electronically Signed   By: Darliss Cheney M.D.   On: 11/17/2022 18:32   DG Chest Portable 1 View  Result Date: 11/17/2022 CLINICAL DATA:  Shortness of breath EXAM: PORTABLE CHEST 1 VIEW COMPARISON:  X-ray 01/01/2022 FINDINGS: Stable cardiopericardial silhouette. Calcified aorta. Osteopenia with scattered degenerative changes. Diffuse interstitial changes seen of the lungs, likely chronic. There is more focal opacity liver seen at the right lung base. A superimposed acute process is possible. No pneumothorax or effusion. Surgical clips in the right upper quadrant of the abdomen. IMPRESSION: Extensive interstitial changes, likely chronic but there  is more focal opacity in the right lung base. An acute process is not excluded. Recommend follow-up. Electronically Signed   By: Karen Kays M.D.   On: 11/17/2022 17:13    Scheduled Meds:  enoxaparin (LOVENOX) injection  40 mg Subcutaneous Q24H   Continuous Infusions:  lactated ringers 100 mL/hr at 11/17/22 2135     LOS: 0 days   Burnadette Pop, MD [facility] P5/02/2023, 8:28 AM

## 2022-11-18 NOTE — ED Notes (Signed)
Started on 2L [place] °

## 2022-11-18 NOTE — ED Notes (Signed)
[facility] contacted for transport  

## 2022-11-18 NOTE — Telephone Encounter (Signed)
Stacey [name] currently admitted in the hospital because of nausea and vomiting.  I indicated to the hospitalist I do not think it is related to the nintedanib but I have advised holding the nintedanib for at least 2 weeks.  Plan - Please ensure she has follow-up with a nurse practitioner in the next 10 days

## 2022-11-18 NOTE — Evaluation (Signed)
Physical Therapy Evaluation Patient Details Name: Stacey [name] MRN: [id number] DOB: [birth year] Today's Date: 11/18/2022  History of Present Illness  82 y.o. female presents to [facility] hospital on 11/17/2022 with nausea, vomiting, and fever. Chest x-ray concerning for possible PNA. PMH includes ILD, OA, RA, asthma, HTN.  Clinical Impression  Pt presents to PT with deficits in strength, power, gait, balance, functional mobility, endurance. Pt is greatly limited by fatigue at this time, appearing exhausted upon PT arrival and with minimal activity. Pt requires assistance to transfer and is unable to ambulate due to fatigue. Pt is unable to identify consistent caregiver support at this time but reports family is looking into hiring help. PT recommends short term inpatient PT placement until consistent caregiver support is available or the pt's ability to mobilize greatly improves.        Recommendations for follow up therapy are one component of a multi-disciplinary discharge planning process, led by the attending physician.  Recommendations may be updated based on patient status, additional functional criteria and insurance authorization.  Follow Up Recommendations Can patient physically be transported by private vehicle: No     Assistance Recommended at Discharge Frequent or constant Supervision/Assistance  Patient can return home with the following  A lot of help with walking and/or transfers;A lot of help with bathing/dressing/bathroom;Assistance with cooking/housework;Assist for transportation;Help with stairs or ramp for entrance    Equipment Recommendations Wheelchair (measurements PT) (if discharging home)  Recommendations for Other Services       Functional Status Assessment Patient has had a recent decline in their functional status and demonstrates the ability to make significant improvements in function in a reasonable and predictable amount of time.     Precautions / Restrictions  Precautions Precautions: Fall Precaution Comments: 2L [place] Restrictions Weight Bearing Restrictions: No      Mobility  Bed Mobility Overal bed mobility: Needs Assistance Bed Mobility: Supine to Sit, Sit to Supine     Supine to sit: Min guard, HOB elevated Sit to supine: Min guard, HOB elevated        Transfers Overall transfer level: Needs assistance Equipment used: Rolling walker (2 wheels) Transfers: Sit to/from Stand Sit to Stand: Min assist                Ambulation/Gait Ambulation/Gait assistance:  (pt declines ambulation due to fatigue)                Stairs            Wheelchair Mobility    Modified Rankin (Stroke Patients Only)       Balance Overall balance assessment: Needs assistance Sitting-balance support: No upper extremity supported, Feet supported Sitting balance-Leahy Scale: Good     Standing balance support: Bilateral upper extremity supported, Reliant on assistive device for balance Standing balance-Leahy Scale: Poor                               Pertinent Vitals/Pain Pain Assessment Pain Assessment: No/denies pain    Home Living Family/patient expects to be discharged to:: Private residence Living Arrangements: Alone (spouse recently passed) Available Help at Discharge:  (no consistent support identified at this time. Pt reports family is looking into hired care) Type of Home: House Home Access: Stairs to enter Entrance Stairs-Rails: None Entrance Stairs-Number of Steps: 2   Home Layout: Two level;Able to live on main level with bedroom/bathroom Home Equipment: Rollator (4 wheels);Cane - quad;Shower seat (small  based quad cane)      Prior Function Prior Level of Function : Independent/Modified Independent;Driving             Mobility Comments: ambulates without device in the home, cane in the community ADLs Comments: independent with ADLs     Hand Dominance   Dominant Hand: Right     Extremity/Trunk Assessment   Upper Extremity Assessment Upper Extremity Assessment: Generalized weakness    Lower Extremity Assessment Lower Extremity Assessment: Generalized weakness    Cervical / Trunk Assessment Cervical / Trunk Assessment: Kyphotic  Communication   Communication: No difficulties  Cognition Arousal/Alertness: Awake/alert Behavior During Therapy: Anxious Overall Cognitive Status: Impaired/Different from baseline Area of Impairment: Problem solving                             Problem Solving: Slow processing General Comments: pt reports feeling foggy        General Comments General comments (skin integrity, edema, etc.): VSS on 2L [place]    Exercises     Assessment/Plan    PT Assessment Patient needs continued PT services  PT Problem List Decreased strength;Decreased activity tolerance;Decreased balance;Decreased mobility;Decreased knowledge of use of DME;Decreased cognition;Decreased safety awareness;Cardiopulmonary status limiting activity       PT Treatment Interventions DME instruction;Gait training;Stair training;Functional mobility training;Therapeutic activities;Therapeutic exercise;Balance training;Neuromuscular re-education;Patient/family education    PT Goals (Current goals can be found in the Care Plan section)  Acute Rehab PT Goals Patient Stated Goal: to improve activity tolerance and strength PT Goal Formulation: With patient Time For Goal Achievement: 12/02/22 Potential to Achieve Goals: Fair    Frequency Min 3X/week     Co-evaluation               AM-PAC PT "6 Clicks" Mobility  Outcome Measure Help needed turning from your back to your side while in a flat bed without using bedrails?: A Little Help needed moving from lying on your back to sitting on the side of a flat bed without using bedrails?: A Little Help needed moving to and from a bed to a chair (including a wheelchair)?: A Little Help needed standing up  from a chair using your arms (e.g., wheelchair or bedside chair)?: A Little Help needed to walk in hospital room?: Total Help needed climbing 3-5 steps with a railing? : Total 6 Click Score: 14    End of Session   Activity Tolerance: Patient limited by fatigue Patient left: in bed;with bed alarm set;with call bell/phone within reach Nurse Communication: Mobility status PT Visit Diagnosis: Other abnormalities of gait and mobility (R26.89);Muscle weakness (generalized) (M62.81)    Time: 1610-9604 PT Time Calculation (min) (ACUTE ONLY): 17 min   Charges:   PT Evaluation $PT Eval Low Complexity: 1 Low          Arlyss Gandy, PT, DPT Acute Rehabilitation Office [phone]   Arlyss Gandy 11/18/2022, 5:30 PM

## 2022-11-18 NOTE — Telephone Encounter (Signed)
HFU scheduled with Katie for 11/23/22

## 2022-11-18 NOTE — ED Notes (Signed)
Patient daughter Tobi Bastos updated on patient status

## 2022-11-18 NOTE — ED Notes (Signed)
RN made aware of pt temp 101.4

## 2022-11-19 DIAGNOSIS — R112 Nausea with vomiting, unspecified: Secondary | ICD-10-CM | POA: Diagnosis not present

## 2022-11-19 LAB — CBC
HCT: 38.3 % (ref 36.0–46.0)
Hemoglobin: 12.5 g/dL (ref 12.0–15.0)
MCH: 29.8 pg (ref 26.0–34.0)
MCHC: 32.6 g/dL (ref 30.0–36.0)
MCV: 91.4 fL (ref 80.0–100.0)
Platelets: 201 10*3/uL (ref 150–400)
RBC: 4.19 MIL/uL (ref 3.87–5.11)
RDW: 13.6 % (ref 11.5–15.5)
WBC: 3.7 10*3/uL — ABNORMAL LOW (ref 4.0–10.5)
nRBC: 0 % (ref 0.0–0.2)

## 2022-11-19 LAB — CULTURE, BLOOD (ROUTINE X 2): Culture: NO GROWTH

## 2022-11-19 LAB — BASIC METABOLIC PANEL
Anion gap: 8 (ref 5–15)
BUN: 10 mg/dL (ref 8–23)
CO2: 25 mmol/L (ref 22–32)
Calcium: 7.9 mg/dL — ABNORMAL LOW (ref 8.9–10.3)
Chloride: 101 mmol/L (ref 98–111)
Creatinine, Ser: 0.69 mg/dL (ref 0.44–1.00)
GFR, Estimated: >60 mL/min (ref >60)
Glucose, Bld: 151 mg/dL — ABNORMAL HIGH (ref 70–99)
Potassium: 3.9 mmol/L (ref 3.5–5.1)
Sodium: 134 mmol/L — ABNORMAL LOW (ref 135–145)

## 2022-11-19 LAB — PROCALCITONIN: Procalcitonin: <0.1 ng/mL

## 2022-11-19 MED ORDER — SODIUM CHLORIDE 0.9 % IV SOLN: INTRAVENOUS | Status: DC

## 2022-11-19 MED ORDER — FLUTICASONE FUROATE-VILANTEROL 100-25 MCG/ACT IN AEPB
1.0000 | INHALATION_SPRAY | Freq: Every day | RESPIRATORY_TRACT | Status: DC
  Administered 2022-11-20: 1 via RESPIRATORY_TRACT
  Filled 2022-11-19: qty 28

## 2022-11-19 NOTE — Progress Notes (Signed)
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  Patient admitted for the management of intractable nausea and vomiting.  Hospital course also remarkable for fever.  Started on antibiotics, cultures sent.  Patient transferred to [facility] because there were no beds available at [facility] & Plan:   Principal Problem:   Nausea & vomiting Active Problems:   Rheumatoid arthritis (HCC)   Osteopenia   Restrictive lung disease   Intractable nausea and vomiting  Intractable nausea and vomiting: Unclear etiology.  Has not had any vomiting since 12 hours but has intermittent nausea and has not eating her breakfast today.  She still is not comfortable going home.  Her daughter who I spoke to over the phone is also not comfortable.  We will continue symptomatic treatment with gentle IV hydration to prevent dehydration.  Previous hospitalist discussed case with patient's primary pulmonologist Dr. Marchelle Gearing who does not believe that patient's symptoms are secondary to nintedanib however recommended holding this for 2 weeks and follow-up with PCCM in 10 days.   Suspected community-acquired pneumonia: Chest imaging also shows right lung base opacity, could not rule out pneumonia.  She had a fever of 101 in the early morning of 5/8.  Started on ceftriaxone, azithromycin.  Blood culture sent which are negative.  Afebrile since then and has no shortness of breath.  Has chronic interstitial lung disease.  Will check procalcitonin and if elevated, will continue antibiotics but otherwise will discontinue.   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  Now on room air.  She uses 2 L of oxygen at home when needed.  Continue bronchodilators as needed.  No wheezing today.  She takes dexamethasone 4 mg every day at home for RA, she has been started on Solu-Medrol.   History of rheumatoid arthritis: Follows with rheumatology at [facility].  On dexamethasone at home.   Hypokalemia: Resolved.   Hypocalcemia: Supplemented   Hypertension: Takes losartan at home.  Continue as needed medication for severe hypertension.   Hyperlipidemia: On Lipitor at home   History of depression: On Effexor   Constipation: Continue bowel regimen.   Weakness: Patient lives [name] nearby  PT evaluated and recommends SNF.  Discussed with daughter, they are agreeable with discharge to SNF.  TOC consulted.  DVT prophylaxis: enoxaparin (LOVENOX) injection 40 mg Start: 11/17/22 2200   Code Status: Full Code  Family Communication:  None present at bedside.  Plan of care discussed with patient in length and he/she verbalized understanding and agreed with it.  Also discussed with the daughter over the phone.  Status is: Inpatient Remains inpatient appropriate because: Patient is still slightly symptomatic and she also needs placement to SNF.  TOC consulted.   Estimated body mass index is 21.64 kg/m as calculated from the following:   Height as of this encounter: 5\' 5"  (1.651 m).   Weight as of this encounter: 59 kg.    Nutritional Assessment: Body mass index is 21.64 kg/m.Marland Kitchen Seen by dietician.  I agree with the assessment and plan as outlined below: Nutrition Status:        . Skin Assessment: I have examined the patient's skin and I  agree with the wound assessment as performed by the wound care RN as outlined below:    Consultants:  None  Procedures:  None  Antimicrobials:  Anti-infectives (From admission, onward)    Start     Dose/Rate Route Frequency Ordered Stop   11/18/22 1000  azithromycin (ZITHROMAX) tablet 500 mg        500 mg  Oral Daily 11/18/22 0836     11/18/22 0945  cefTRIAXone (ROCEPHIN) 2 g in sodium chloride 0.9 % 100 mL IVPB        2 g 200 mL/hr over 30 Minutes Intravenous Every 24 hours 11/18/22 0836     11/18/22 0900  sulfamethoxazole-trimethoprim (BACTRIM DS) 800-160 MG per tablet 1 tablet  Status:  Discontinued       Note to Pharmacy: Patient taking differently: Monday Wednesday friday     1 tablet Oral Once per day on Mon Wed Fri 11/18/22 2956 11/18/22 0836         Subjective: Patient seen and examined.  She states that she has intermittent nausea but no vomiting since 12 hours.  No shortness of breath.  No other complaint.  She feels very weak.  Objective: Vitals:   11/18/22 1542 11/18/22 2055 11/19/22 0543 11/19/22 0905  BP: (!) 162/72 (!) 160/64 (!) 127/54 (!) 139/57  Pulse: 79 73 66 67  Resp:  20 16   Temp: 98.4 F (36.9 C) 98.2 F (36.8 C) 98 F (36.7 C) 97.9 F (36.6 C)  TempSrc: Oral Oral Oral Oral  SpO2: 98% 95% 99% 99%  Weight:      Height:        Intake/Output Summary (Last 24 hours) at 11/19/2022 1043 Last data filed at 11/19/2022 0211 Gross per 24 hour  Intake 1060 ml  Output 1 ml  Net 1059 ml   Filed Weights   11/17/22 1547  Weight: 59 kg    Examination:  General exam: Appears calm and comfortable  Respiratory system: Scattered rhonchi bilaterally. Cardiovascular system: S1 & S2 heard, RRR. No JVD, murmurs, rubs, gallops or clicks. No pedal edema. Gastrointestinal system: Abdomen is nondistended, soft and nontender. No organomegaly or masses felt. Normal bowel sounds heard. Central nervous system: Alert and oriented. No focal neurological deficits. Extremities: Symmetric 5 x 5 power. Skin: No rashes, lesions or ulcers Psychiatry: Judgement and insight appear normal. Mood & affect appropriate.    Data Reviewed: I have personally reviewed following labs and imaging studies  CBC: Recent Labs  Lab 11/17/22 1552 11/18/22 0443 11/19/22 0838  WBC 7.7 6.8 3.7*   HGB 13.7 12.0 12.5  HCT 42.1 37.8 38.3  MCV 93.1 94.5 91.4  PLT 224 181 201   Basic Metabolic Panel: Recent Labs  Lab 11/17/22 1552 11/18/22 0443 11/19/22 0838  NA 135 133* 134*  K 3.6 3.2* 3.9  CL 98 104 101  CO2 27 21* 25  GLUCOSE 105* 78 151*  BUN 13 10 10   CREATININE 0.82 0.61 0.69  CALCIUM 8.5* 6.8* 7.9*   GFR: Estimated Creatinine Clearance: 48.8 mL/min (by C-G formula based on SCr of 0.69 mg/dL). Liver Function Tests: Recent Labs  Lab 11/17/22 1552 11/18/22 0443  AST 27 23  ALT 15 12  ALKPHOS 52 40  BILITOT 0.6 0.7  PROT 6.4* 5.0*  ALBUMIN 3.8 2.7*   Recent Labs  Lab 11/17/22 1552  LIPASE 25   No results for input(s): "AMMONIA" in the last 168 hours. Coagulation Profile: No results for input(s): "INR", "PROTIME" in the last  168 hours. Cardiac Enzymes: No results for input(s): "CKTOTAL", "CKMB", "CKMBINDEX", "TROPONINI" in the last 168 hours. BNP (last 3 results) No results for input(s): "PROBNP" in the last 8760 hours. HbA1C: No results for input(s): "HGBA1C" in the last 72 hours. CBG: No results for input(s): "GLUCAP" in the last 168 hours. Lipid Profile: No results for input(s): "CHOL", "HDL", "LDLCALC", "TRIG", "CHOLHDL", "LDLDIRECT" in the last 72 hours. Thyroid Function Tests: No results for input(s): "TSH", "T4TOTAL", "FREET4", "T3FREE", "THYROIDAB" in the last 72 hours. Anemia Panel: No results for input(s): "VITAMINB12", "FOLATE", "FERRITIN", "TIBC", "IRON", "RETICCTPCT" in the last 72 hours. Sepsis Labs: No results for input(s): "PROCALCITON", "LATICACIDVEN" in the last 168 hours.  Recent Results (from the past 240 hour(s))  Culture, blood (Routine X 2) w Reflex to ID Panel     Status: None (Preliminary result)   Collection Time: 11/18/22 10:40 AM   Specimen: BLOOD  Result Value Ref Range Status   Specimen Description   Final    BLOOD BLOOD RIGHT WRIST Performed at [facility], [address] [place], [place]  [address]    Special Requests   Final    BOTTLES DRAWN AEROBIC AND ANAEROBIC Blood Culture results may not be optimal due to an excessive volume of blood received in culture bottles Performed at [facility], [address], [place], [place] [address]    Culture   Final    NO GROWTH < 24 HOURS Performed at [facility], [address] [place], [place] [address]    Report Status PENDING  Incomplete  Culture, blood (Routine X 2) w Reflex to ID Panel     Status: None (Preliminary result)   Collection Time: 11/18/22 10:49 AM   Specimen: BLOOD  Result Value Ref Range Status   Specimen Description   Final    BLOOD BLOOD RIGHT FOREARM Performed at [facility], [address], [place], [place] [address]    Special Requests   Final    BOTTLES DRAWN AEROBIC AND ANAEROBIC Blood Culture results may not be optimal due to an excessive volume of blood received in culture bottles Performed at [facility], [address] [place], [place] [address]    Culture   Final    NO GROWTH < 24 HOURS Performed at [facility], [address] [place], [place] [address]    Report Status PENDING  Incomplete     Radiology Studies: CT ABDOMEN PELVIS W CONTRAST  Result Date: 11/17/2022 CLINICAL DATA:  Acute abdominal pain and vomiting. EXAM: CT ABDOMEN AND PELVIS WITH CONTRAST TECHNIQUE: Multidetector CT imaging of the abdomen and pelvis was performed using the standard protocol following bolus administration of intravenous contrast. RADIATION DOSE REDUCTION: This exam was performed according to the departmental dose-optimization program which includes automated exposure control, adjustment of the mA and/or kV according to patient size and/or use of iterative reconstruction technique. CONTRAST:  80mL OMNIPAQUE IOHEXOL 300 MG/ML  SOLN COMPARISON:  CT abdomen and pelvis 07/03/2019. CT chest high-resolution 03/04/2020 FINDINGS: Lower chest: Peripheral  interstitial and ground-glass opacities are seen in both lung bases, increased from prior. Hepatobiliary: No focal liver abnormality is seen. Status post cholecystectomy. No biliary dilatation. Pancreas: Unremarkable. No pancreatic ductal dilatation or surrounding inflammatory changes. Spleen: Normal in size without focal abnormality. Adrenals/Urinary Tract: Adrenal glands are unremarkable. Kidneys are normal, without renal calculi, focal lesion, or hydronephrosis. Bladder is unremarkable. Stomach/Bowel: There is a small hiatal hernia. Stomach is otherwise  within normal limits. Appendix appears normal. No evidence of bowel wall thickening, distention, or inflammatory changes. There is a large amount of stool throughout the colon. Vascular/Lymphatic: Aortic atherosclerosis. No enlarged abdominal or pelvic lymph nodes. Reproductive: Uterus and bilateral adnexa are unremarkable. Other: No abdominal wall hernia or abnormality. No abdominopelvic ascites. Musculoskeletal: No acute or significant osseous findings. IMPRESSION: 1. No acute process in the abdomen or pelvis. 2. Large amount of stool throughout the colon. 3. Small hiatal hernia. 4. Peripheral interstitial and ground-glass opacities in both lung bases, increased from prior. Findings are favored to represent sequela of chronic interstitial lung disease. Aortic Atherosclerosis (ICD10-I70.0). Electronically Signed   By: Darliss Cheney M.D.   On: 11/17/2022 18:32   DG Chest Portable 1 View  Result Date: 11/17/2022 CLINICAL DATA:  Shortness of breath EXAM: PORTABLE CHEST 1 VIEW COMPARISON:  X-ray 01/01/2022 FINDINGS: Stable cardiopericardial silhouette. Calcified aorta. Osteopenia with scattered degenerative changes. Diffuse interstitial changes seen of the lungs, likely chronic. There is more focal opacity liver seen at the right lung base. A superimposed acute process is possible. No pneumothorax or effusion. Surgical clips in the right upper quadrant of the  abdomen. IMPRESSION: Extensive interstitial changes, likely chronic but there is more focal opacity in the right lung base. An acute process is not excluded. Recommend follow-up. Electronically Signed   By: Karen Kays M.D.   On: 11/17/2022 17:13    Scheduled Meds:  azithromycin  500 mg Oral Daily   enoxaparin (LOVENOX) injection  40 mg Subcutaneous Q24H   fluticasone furoate-vilanterol  1 puff Inhalation Daily   gabapentin  600 mg Oral QHS   losartan  25 mg Oral Daily   methylPREDNISolone (SOLU-MEDROL) injection  40 mg Intravenous Q12H   pantoprazole  40 mg Oral BID AC   polyethylene glycol  17 g Oral Daily   pravastatin  20 mg Oral q1800   senna-docusate  1 tablet Oral BID   venlafaxine XR  75 mg Oral QHS   Continuous Infusions:  cefTRIAXone (ROCEPHIN)  IV 2 g (11/19/22 1041)     LOS: 1 day   Hughie Closs, MD [facility]  11/19/2022, 10:43 AM   *Please note that this is a verbal dictation therefore any spelling or grammatical errors are due to the "Dragon [employer] One" system interpretation.  Please page via Amion and do not message via secure chat for urgent patient care matters. Secure chat can be used for non urgent patient care matters.  How to contact the [facility] Attending or Consulting provider 7A - 7P or covering provider during after hours 7P -7A, for this patient?  Check the care team in [facility] and look for a) attending/consulting TRH provider listed and b) the [facility] team listed. Page or secure chat 7A-7P. Log into [email or web address] and use [facility]'s universal password to access. If you do not have the password, please contact the hospital operator. Locate the [facility] provider you are looking for under [facility] and page to a number that you can be directly reached. If you still have difficulty reaching the provider, please page the [facility] (Director on Call) for the Hospitalists listed on amion for assistance.

## 2022-11-19 NOTE — Plan of Care (Signed)

## 2022-11-19 NOTE — Progress Notes (Signed)
Physical Therapy Treatment Patient Details Name: Stacey [name] MRN: [id number] DOB: 1941-01-06 Today's Date: 11/19/2022   History of Present Illness 82 y.o. female presents to [facility] hospital on 11/17/2022 with nausea, vomiting, and fever. Chest x-ray concerning for possible PNA. PMH includes ILD, OA, RA, asthma, HTN.    PT Comments    Pt greeted resting in bed and agreeable to session with good progress towards acute goals. Pt demonstrating bed mobility, transfers and gait with walker and rollator support with grossly min guard assist for safety. Pt able to progress gait in hall for 116', however pt continues to be limited by fatigue and decreased activity tolerance. VSS on RA throughout activity. Current plan remains appropriate to address deficits and maximize functional independence and safety. Pt continues to benefit from skilled PT services to progress toward functional mobility goals.    Recommendations for follow up therapy are one component of a multi-disciplinary discharge planning process, led by the attending physician.  Recommendations may be updated based on patient status, additional functional criteria and insurance authorization.  Follow Up Recommendations  Can patient physically be transported by private vehicle: No    Assistance Recommended at Discharge Frequent or constant Supervision/Assistance  Patient can return home with the following A lot of help with walking and/or transfers;A lot of help with bathing/dressing/bathroom;Assistance with cooking/housework;Assist for transportation;Help with stairs or ramp for entrance   Equipment Recommendations  Wheelchair (measurements PT)    Recommendations for Other Services       Precautions / Restrictions Precautions Precautions: Fall Restrictions Weight Bearing Restrictions: No     Mobility  Bed Mobility Overal bed mobility: Needs Assistance Bed Mobility: Supine to Sit, Sit to Supine     Supine to sit: Min guard,  HOB elevated Sit to supine: Min guard, HOB elevated        Transfers Overall transfer level: Needs assistance Equipment used: Standard walker, None Transfers: Sit to/from Stand, Bed to chair/wheelchair/BSC Sit to Stand: Min assist, Min guard           General transfer comment: steadying assist    Ambulation/Gait Ambulation/Gait assistance: Min guard, Min assist Gait Distance (Feet): 116 Feet (+20) Assistive device: Rollator (4 wheels), Standard walker Gait Pattern/deviations: Step-through pattern, Decreased stride length, Trunk flexed Gait velocity: decr     General Gait Details: slow gait with walker from EOB to bathroom, progressing with rollator in hall, cues for posture and self pacing   Stairs             Wheelchair Mobility    Modified Rankin (Stroke Patients Only)       Balance Overall balance assessment: Needs assistance   Sitting balance-Leahy Scale: Good     Standing balance support: Bilateral upper extremity supported, Reliant on assistive device for balance Standing balance-Leahy Scale: Poor                              Cognition Arousal/Alertness: Awake/alert Behavior During Therapy: Anxious, WFL for tasks assessed/performed Overall Cognitive Status: Within Functional Limits for tasks assessed                                          Exercises      General Comments General comments (skin integrity, edema, etc.): VSS on RA, SpO2 97% post activity      Pertinent Vitals/Pain Pain  Assessment Pain Assessment: No/denies pain    Home Living                          Prior Function            PT Goals (current goals can now be found in the care plan section) Acute Rehab PT Goals PT Goal Formulation: With patient Time For Goal Achievement: 12/02/22 Progress towards PT goals: Progressing toward goals    Frequency    Min 3X/week      PT Plan Current plan remains appropriate     Co-evaluation              AM-PAC PT "6 Clicks" Mobility   Outcome Measure  Help needed turning from your back to your side while in a flat bed without using bedrails?: A Little Help needed moving from lying on your back to sitting on the side of a flat bed without using bedrails?: A Little Help needed moving to and from a bed to a chair (including a wheelchair)?: A Little Help needed standing up from a chair using your arms (e.g., wheelchair or bedside chair)?: A Little Help needed to walk in hospital room?: A Little Help needed climbing 3-5 steps with a railing? : Total 6 Click Score: 16    End of Session Equipment Utilized During Treatment: Gait belt Activity Tolerance: Patient tolerated treatment well Patient left: in bed;with call bell/phone within reach Nurse Communication: Mobility status PT Visit Diagnosis: Other abnormalities of gait and mobility (R26.89);Muscle weakness (generalized) (M62.81)     Time: 4098-1191 PT Time Calculation (min) (ACUTE ONLY): 24 min  Charges:  $Gait Training: 8-22 mins $Therapeutic Activity: 8-22 mins                     Elizardo Chilson R. PTA Acute Rehabilitation Services Office: [phone]    Catalina Antigua 11/19/2022, 3:39 PM

## 2022-11-19 NOTE — [place] FL2 (Addendum)
[place] MEDICAID FL2 LEVEL OF CARE FORM     IDENTIFICATION  Patient Name: Stacey [name] Birthdate: 12-Jun-1941 Sex: female Admission Date (Current Location): 11/17/2022  [facility] and [place] Number:  Producer, television/film/video and Address:  [facility] [place], [address] [place], [place] [address]      Provider Number: [id number]  Attending Physician Name and Address:  Hughie Closs, MD  Relative Name and Phone Number:  Makilah Goodall [facility])  [phone]    Current Level of Care: Hospital Recommended Level of Care: Skilled Nursing Facility Prior Approval Number:    Date Approved/Denied:   PASRR Number: [id number]  Discharge Plan: SNF    Current Diagnoses: Patient Active Problem List   Diagnosis Date Noted   Intractable nausea and vomiting 11/18/2022   Nausea & vomiting 11/17/2022   Chronic cough 06/12/2020   Constipation    CAP (community acquired pneumonia) 02/09/2020   Acute on chronic respiratory failure with hypoxia (HCC) 07/06/2019   Fall at home, initial encounter 07/06/2019   Orthostatic hypotension 07/06/2019   Rib fractures 07/04/2019   Pneumonia due to COVID-19 virus 06/11/2019   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HCC) 05/07/2019   Acute asthma exacerbation 12/18/2018   Elevated troponin 12/18/2018   Acute bronchitis 08/30/2018   Chronic respiratory failure with hypoxia (HCC) 04/06/2018   Essential hypertension 03/24/2018   Depression 03/24/2018   Asthma exacerbation 03/23/2018   DOE (dyspnea on exertion) 03/21/2018   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 esophagitis 03/21/2018   Chronic lung disease    Hypoxia    Dyspnea 01/23/2018   ILD (interstitial lung disease) (HCC) 01/23/2018   Claw toe, acquired, 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   Restrictive lung disease 07/18/2013   Multiple pulmonary nodules/RA lung dz  05/29/2013   Cough variant asthma vs uacs  05/29/2013   Rheumatoid arthritis (HCC) 01/13/2012   Osteopenia 01/13/2012   Lichen sclerosus et atrophicus of the  vulva 01/13/2012   Asthma 01/13/2012    Orientation RESPIRATION BLADDER Height & Weight     Self, Time, Situation, Place  Normal Incontinent Weight: 130 lb 1.1 oz (59 kg) Height:  5\' 5"  (165.1 cm)  BEHAVIORAL SYMPTOMS/MOOD NEUROLOGICAL BOWEL NUTRITION STATUS      Continent Diet (see discharege summary)  AMBULATORY STATUS COMMUNICATION OF NEEDS Skin   Limited Assist Verbally Normal                       Personal Care Assistance Level of Assistance  Bathing, Feeding, Dressing Bathing Assistance: Maximum assistance Feeding assistance: Limited assistance Dressing Assistance: Maximum assistance     Functional Limitations Info  Sight, Hearing, Speech Sight Info: Adequate Hearing Info: Adequate Speech Info: Adequate    SPECIAL CARE FACTORS FREQUENCY  PT (By licensed PT), OT (By licensed OT)     PT Frequency: 5x/week OT Frequency: 5x/week            Contractures Contractures Info: Not present    Additional Factors Info  Code Status, Allergies Code Status Info: FULL Allergies Info: Penicillins  Remicade (Infliximab)           Current Medications (11/19/2022):  This is the current hospital active medication list Current Facility-Administered Medications  Medication Dose Route Frequency Provider Last Rate Last Admin   0.9 %  sodium chloride infusion   Intravenous Continuous Hughie Closs, MD 75 mL/hr at 11/19/22 1207 New Bag at 11/19/22 1207   azithromycin (ZITHROMAX) tablet 500 mg  500 mg Oral Daily Burnadette Pop, MD   500  mg at 11/19/22 1038   cefTRIAXone (ROCEPHIN) 2 g in sodium chloride 0.9 % 100 mL IVPB  2 g Intravenous Q24H Burnadette Pop, MD 200 mL/hr at 11/19/22 1041 2 g at 11/19/22 1041   enoxaparin (LOVENOX) injection 40 mg  40 mg Subcutaneous Q24H Earlie Lou L, MD   40 mg at 11/18/22 2047   fluticasone furoate-vilanterol (BREO ELLIPTA) 100-25 MCG/ACT 1 puff  1 puff Inhalation Daily Burnadette Pop, MD   1 puff at 11/19/22 1118   gabapentin  (NEURONTIN) capsule 600 mg  600 mg Oral QHS Burnadette Pop, MD   600 mg at 11/18/22 2047   guaiFENesin-dextromethorphan (ROBITUSSIN DM) 100-10 MG/5ML syrup 10 mL  10 mL Oral Q4H PRN Burnadette Pop, MD       hydrALAZINE (APRESOLINE) injection 10 mg  10 mg Intravenous Q6H PRN Adhikari, Amrit, MD       ipratropium-albuterol (DUONEB) 0.5-2.5 (3) MG/3ML nebulizer solution 3 mL  3 mL Nebulization Q6H PRN Adhikari, Amrit, MD   3 mL at 11/18/22 1259   losartan (COZAAR) tablet 25 mg  25 mg Oral Daily Adhikari, Amrit, MD   25 mg at 11/19/22 1038   methylPREDNISolone sodium succinate (SOLU-MEDROL) 125 mg/2 mL injection 40 mg  40 mg Intravenous Q12H Adhikari, Amrit, MD   40 mg at 11/19/22 0226   ondansetron (ZOFRAN) tablet 4 mg  4 mg Oral Q6H PRN Rometta Emery, MD       Or   ondansetron (ZOFRAN) injection 4 mg  4 mg Intravenous Q6H PRN Earlie Lou L, MD   4 mg at 11/18/22 1259   pantoprazole (PROTONIX) EC tablet 40 mg  40 mg Oral BID AC Adhikari, Amrit, MD   40 mg at 11/19/22 1038   polyethylene glycol (MIRALAX / GLYCOLAX) packet 17 g  17 g Oral Daily Burnadette Pop, MD   17 g at 11/18/22 0924   pravastatin (PRAVACHOL) tablet 20 mg  20 mg Oral q1800 Burnadette Pop, MD   20 mg at 11/18/22 1831   senna-docusate (Senokot-S) tablet 1 tablet  1 tablet Oral BID Burnadette Pop, MD   1 tablet at 11/19/22 1038   venlafaxine XR (EFFEXOR-XR) 24 hr capsule 75 mg  75 mg Oral QHS Burnadette Pop, MD   75 mg at 11/18/22 2047     Discharge Medications: Please see discharge summary for a list of discharge medications.  Relevant Imaging Results:  Relevant Lab Results:   Additional Information SSN:[id number]  Luverne Zerkle A [place], [place]

## 2022-11-19 NOTE — Progress Notes (Signed)
Patient requested to have purwick in place and has been educated on the risk of purwick placement. Dr. Beacher May stated it was okay to have purwick in for the night.

## 2022-11-19 NOTE — Evaluation (Signed)
Occupational Therapy Evaluation Patient Details Name: Stacey [name] MRN: [id number] DOB: 1941-04-05 Today's Date: 11/19/2022   History of Present Illness 82 y.o. female [facility] on [facility]17/2022 with nausea, vomiting, and fever. Chest x-ray concerning for possible PNA. PMH includes ILD, OA, RA, asthma, HTN.   Clinical Impression   Pt was independent and living alone prior to admission. Her husband recently passed away. Pt has been driving minimally due to diplopia which is not being treated with prism lenses by her ophthalmologist. Pt presents with generalized weakness, decreased activity tolerance and impaired standing balance requiring up to min assist for OOB and set up to min assist for ADLs with increased time. Patient will benefit from continued inpatient follow up therapy, <3 hours/day. Pt with SpO2 of 92% on RA.       Recommendations for follow up therapy are one component of a multi-disciplinary discharge planning process, led by the attending physician.  Recommendations may be updated based on patient status, additional functional criteria and insurance authorization.   Assistance Recommended at Discharge Frequent or constant Supervision/Assistance  Patient can return home with the following A little help with walking and/or transfers;A little help with bathing/dressing/bathroom;Assistance with cooking/housework;Assist for transportation;Help with stairs or ramp for entrance    Functional Status Assessment  Patient has had a recent decline in their functional status and demonstrates the ability to make significant improvements in function in a reasonable and predictable amount of time.  Equipment Recommendations  BSC/3in1    Recommendations for Other Services       Precautions / Restrictions Precautions Precautions: Fall Restrictions Weight Bearing Restrictions: No      Mobility Bed Mobility Overal bed mobility: Needs Assistance Bed Mobility: Supine to Sit,  Sit to Supine     Supine to sit: Min guard, HOB elevated Sit to supine: Min guard, HOB elevated        Transfers Overall transfer level: Needs assistance Equipment used: 1 person hand held assist Transfers: Sit to/from Stand, Bed to chair/wheelchair/BSC Sit to Stand: Min assist Stand pivot transfers: Min assist         General transfer comment: steadying assist      Balance Overall balance assessment: Needs assistance   Sitting balance-Leahy Scale: Good     Standing balance support: Bilateral upper extremity supported, Reliant on assistive device for balance Standing balance-Leahy Scale: Poor                             ADL either performed or assessed with clinical judgement   ADL Overall ADL's : Needs assistance/impaired Eating/Feeding: Independent;Sitting   Grooming: Wash/dry hands;Wash/dry face;Sitting;Set up   Upper Body Bathing: Minimal assistance;Sitting   Lower Body Bathing: Minimal assistance;Sit to/from stand   Upper Body Dressing : Set up;Sitting   Lower Body Dressing: Minimal assistance;Sit to/from stand   Toilet Transfer: Minimal assistance;Stand-pivot;BSC/3in1   Toileting- Clothing Manipulation and Hygiene: Set up;Sitting/lateral lean               Vision Baseline Vision/History: 1 Wears glasses Ability to See in Adequate Light: 0 Adequate Patient Visual Report: No change from baseline Additional Comments: pt with hx of diplopia, has prism lenses in glasses     Perception     Praxis      Pertinent Vitals/Pain Pain Assessment Pain Assessment: No/denies pain     Hand Dominance Right   Extremity/Trunk Assessment Upper Extremity Assessment Upper Extremity Assessment: Generalized weakness  Cervical / Trunk Assessment Cervical / Trunk Assessment: Kyphotic   Communication Communication Communication: No difficulties   Cognition Arousal/Alertness: Awake/alert Behavior During Therapy: Anxious Overall  Cognitive Status: Within Functional Limits for tasks assessed                                       General Comments       Exercises     Shoulder Instructions      Home Living Family/patient expects to be discharged to:: Private residence Living Arrangements: Alone;Other (Comment) (husband died in 10-28-22)   Type of Home: House Home Access: Stairs to enter [employer] of Steps: 2 Entrance Stairs-Rails: None Home Layout: Two level;Able to live on main level with bedroom/bathroom     Bathroom Shower/Tub: Producer, television/film/video: Standard     Home Equipment: Rollator (4 wheels);Cane - quad;Shower seat;Other (comment) [facility])          Prior Functioning/Environment Prior Level of Function : Independent/Modified Independent;Driving             Mobility Comments: ambulates without device in the home, cane in the community ADLs Comments: independent with ADLs, has not been driving due to vision        OT Problem List: Decreased strength;Decreased activity tolerance;Impaired balance (sitting and/or standing)      OT Treatment/Interventions: Self-care/ADL training;DME and/or AE instruction;Therapeutic activities;Patient/family education;Balance training;Energy conservation    OT Goals(Current goals can be found in the care plan section) Acute Rehab OT Goals OT Goal Formulation: With patient Time For Goal Achievement: 12/03/22 Potential to Achieve Goals: Good ADL Goals Pt Will Perform Grooming: with supervision;standing Pt Will Perform Lower Body Bathing: with supervision;sit to/from stand Pt Will Perform Lower Body Dressing: with supervision;sit to/from stand Pt Will Transfer to Toilet: with supervision;ambulating;bedside commode Pt Will Perform Toileting - Clothing Manipulation and hygiene: with supervision;sit to/from stand  OT Frequency: Min 2X/week    Co-evaluation              AM-PAC OT "6 Clicks" Daily Activity      Outcome Measure Help from another person eating meals?: None Help from another person taking care of personal grooming?: A Little Help from another person toileting, which includes using toliet, bedpan, or urinal?: A Little Help from another person bathing (including washing, rinsing, drying)?: A Little Help from another person to put on and taking off regular upper body clothing?: A Little Help from another person to put on and taking off regular lower body clothing?: A Little 6 Click Score: 19   End of Session    Activity Tolerance: Patient tolerated treatment well Patient left: in bed;with call bell/phone within reach;with bed alarm set  OT Visit Diagnosis: Unsteadiness on feet (R26.81);Other abnormalities of gait and mobility (R26.89);Muscle weakness (generalized) (M62.81);Other (comment) (decreased activity tolerance)                Time: 1000-1028 OT Time Calculation (min): 28 min Charges:  OT General Charges $OT Visit: 1 Visit OT Evaluation $OT Eval Moderate Complexity: 1 Mod OT Treatments $Self Care/Home Management : 8-22 mins  Berna Spare, OTR/L Acute Rehabilitation Services Office: [phone]   Evern Bio 11/19/2022, 11:44 AM

## 2022-11-19 NOTE — TOC Initial Note (Signed)
Transition of Care [facility]) - Initial/Assessment Note    Patient Details  Name: Stacey [name] MRN: [id number] Date of Birth: 12-30-1940  [facility] CM/SW Contact:    Styles Fambro A [place], Theresia Majors Phone Number: 11/19/2022, 11:53 AM  Clinical Narrative:                  CSW met with pt at bedside and said "think skilled would be a good plan." She said that she wants [facility] for [facility] nursing. CW will reach out to facility regarding bed availability. Pt does not need insurance authorization.   TOC will continue to follow.   Expected Discharge Plan: Skilled Nursing Facility Barriers to Discharge: SNF Pending bed offer   Patient Goals and CMS Choice Patient states their goals for this hospitalization and ongoing recovery are:: get back on my feet          Expected Discharge Plan and Services       Living arrangements for the past 2 months: Single Family Home                                      Prior Living Arrangements/Services Living arrangements for the past 2 months: Single Family Home Lives with:: Self                   Activities of Daily Living Home Assistive Devices/Equipment: Cane (specify quad or straight) ADL Screening (condition at time of admission) Patient's cognitive ability adequate to safely complete daily activities?: Yes Is the patient deaf or have difficulty hearing?: No Does the patient have difficulty seeing, even when wearing glasses/contacts?: No Does the patient have difficulty concentrating, remembering, or making decisions?: No Patient able to express need for assistance with ADLs?: Yes Does the patient have difficulty dressing or bathing?: No Independently performs ADLs?: Yes (appropriate for developmental age) Does the patient have difficulty walking or climbing stairs?: No Weakness of Legs: None Weakness of Arms/Hands: None  Permission Sought/Granted                  Emotional Assessment Appearance::  Appears stated age Attitude/Demeanor/Rapport: Engaged Affect (typically observed): Pleasant Orientation: : Oriented to Self, Oriented to Place, Oriented to  Time, Oriented to Situation Alcohol / Substance Use: Not Applicable Psych Involvement: No (comment)  Admission diagnosis:  Dehydration [E86.0] Nausea & vomiting [R11.2] Nausea and vomiting, unspecified vomiting type [R11.2] Intractable nausea and vomiting [R11.2] Patient Active Problem List   Diagnosis Date Noted   Intractable nausea and vomiting 11/18/2022   Nausea & vomiting 11/17/2022   Chronic cough 06/12/2020   Constipation    CAP (community acquired pneumonia) 02/09/2020   Acute on chronic respiratory failure with hypoxia (HCC) 07/06/2019   Fall at home, initial encounter 07/06/2019   Orthostatic hypotension 07/06/2019   Rib fractures 07/04/2019   Pneumonia due to COVID-19 virus 06/11/2019   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HCC) 05/07/2019   Acute asthma exacerbation 12/18/2018   Elevated troponin 12/18/2018   Acute bronchitis 08/30/2018   Chronic respiratory failure with hypoxia (HCC) 04/06/2018   Essential hypertension 03/24/2018   Depression 03/24/2018   Asthma exacerbation 03/23/2018   DOE (dyspnea on exertion) 03/21/2018   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 esophagitis 03/21/2018   Chronic lung disease    Hypoxia    Dyspnea 01/23/2018   ILD (interstitial lung disease) (HCC) 01/23/2018   Claw toe, acquired, 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   Restrictive lung  disease 07/18/2013   Multiple pulmonary nodules/RA lung dz  05/29/2013   Cough variant asthma vs uacs  05/29/2013   Rheumatoid arthritis (HCC) 01/13/2012   Osteopenia 01/13/2012   Lichen sclerosus et atrophicus of the vulva 01/13/2012   Asthma 01/13/2012   PCP:  [name], MD Pharmacy:   [facility] [place], [place] - [address] [place] [address] Phone: [phone] Fax: [phone]  [facility] [place], [place] - [address] [facility] [address] [place] [address] Phone: [phone] Fax: [phone]  [facility] [place], [place] - [phone] [address] [place] [address] Phone: [phone] Fax: [phone]  [facility] - [place], [place] - [id number] [place] [address] Phone: [phone] Fax: [phone]     Social Determinants of Health (SDOH) Social History: SDOH Screenings   Food Insecurity: No Food Insecurity (11/18/2022)  Housing: Low Risk  (11/18/2022)  Transportation Needs: No Transportation Needs (11/18/2022)  Utilities: Not At Risk (11/18/2022)  Depression (PHQ2-9): Low Risk  (10/24/2020)  Tobacco Use: Low Risk  (11/17/2022)   SDOH Interventions:     Readmission Risk Interventions     No data to display

## 2022-11-20 DIAGNOSIS — R2681 Unsteadiness on feet: Secondary | ICD-10-CM | POA: Diagnosis not present

## 2022-11-20 DIAGNOSIS — J45998 Other asthma: Secondary | ICD-10-CM | POA: Diagnosis not present

## 2022-11-20 DIAGNOSIS — M8580 Other specified disorders of bone density and structure, unspecified site: Secondary | ICD-10-CM | POA: Diagnosis not present

## 2022-11-20 DIAGNOSIS — F329 Major depressive disorder, single episode, unspecified: Secondary | ICD-10-CM | POA: Diagnosis not present

## 2022-11-20 DIAGNOSIS — R2689 Other abnormalities of gait and mobility: Secondary | ICD-10-CM | POA: Diagnosis not present

## 2022-11-20 DIAGNOSIS — R112 Nausea with vomiting, unspecified: Secondary | ICD-10-CM | POA: Diagnosis not present

## 2022-11-20 DIAGNOSIS — J849 Interstitial pulmonary disease, unspecified: Secondary | ICD-10-CM | POA: Diagnosis not present

## 2022-11-20 DIAGNOSIS — M069 Rheumatoid arthritis, unspecified: Secondary | ICD-10-CM | POA: Diagnosis not present

## 2022-11-20 DIAGNOSIS — J45901 Unspecified asthma with (acute) exacerbation: Secondary | ICD-10-CM | POA: Diagnosis not present

## 2022-11-20 DIAGNOSIS — J984 Other disorders of lung: Secondary | ICD-10-CM | POA: Diagnosis not present

## 2022-11-20 DIAGNOSIS — M6281 Muscle weakness (generalized): Secondary | ICD-10-CM | POA: Diagnosis not present

## 2022-11-20 DIAGNOSIS — R5383 Other fatigue: Secondary | ICD-10-CM | POA: Diagnosis not present

## 2022-11-20 DIAGNOSIS — Z7401 Bed confinement status: Secondary | ICD-10-CM | POA: Diagnosis not present

## 2022-11-20 DIAGNOSIS — J189 Pneumonia, unspecified organism: Secondary | ICD-10-CM | POA: Diagnosis not present

## 2022-11-20 DIAGNOSIS — J9611 Chronic respiratory failure with hypoxia: Secondary | ICD-10-CM | POA: Diagnosis not present

## 2022-11-20 DIAGNOSIS — I10 Essential (primary) hypertension: Secondary | ICD-10-CM | POA: Diagnosis not present

## 2022-11-20 DIAGNOSIS — K219 Gastro-esophageal reflux disease without esophagitis: Secondary | ICD-10-CM | POA: Diagnosis not present

## 2022-11-20 LAB — CULTURE, BLOOD (ROUTINE X 2): Culture: NO GROWTH

## 2022-11-20 MED ORDER — OFEV 100 MG PO CAPS: 1.0000 | ORAL_CAPSULE | Freq: Two times a day (BID) | ORAL | 10 refills | Status: DC

## 2022-11-20 MED ORDER — CEFDINIR 300 MG PO CAPS: 300.0000 mg | ORAL_CAPSULE | Freq: Two times a day (BID) | ORAL | 0 refills | Status: AC

## 2022-11-20 NOTE — Progress Notes (Signed)
This RN gave nurse to nurse report to [facility] Clinical Care coordinator at [place], via phone conversation.  Patient and family aware.

## 2022-11-20 NOTE — Discharge Summary (Signed)
Physician Discharge Summary  Stacey [name] ZOX:[id number] DOB: 1940/07/27 DOA: 11/17/2022  PCP: Stacey Corn, MD  Admit date: 11/17/2022 Discharge date: 11/20/2022 30 Day Unplanned Readmission Risk Score    Flowsheet Row ED to Hosp-Admission (Current) from 11/17/2022 in [facility] GENERAL MED/SURG UNIT  30 Day Unplanned Readmission Risk Score (%) 13.93 Filed at 11/20/2022 0801       This score is the patient's risk of an unplanned readmission within 30 days of being discharged (0 -100%). The score is based on dignosis, age, lab data, medications, orders, and past utilization.   Low:  0-14.9   Medium: 15-21.9   High: 22-29.9   Extreme: 30 and above          Admitted From: Home Disposition: SNF  Recommendations for Outpatient Follow-up:  Follow up with PCP in 1-2 weeks Please obtain BMP/CBC in one week Follow-up with your primary pulmonologist in 10 days. Please follow up with your PCP on the following pending results: Unresulted Labs (From admission, onward)     Start     Ordered   11/24/22 0500  Creatinine, serum  (enoxaparin (LOVENOX)    CrCl >/= 30 ml/min)  Weekly,   R     Comments: while on enoxaparin therapy    11/17/22 2004   11/17/22 1558  Urinalysis, Routine w reflex microscopic -Urine, Clean Catch  Once,   R       Question:  Specimen Source  Answer:  Urine, Clean Catch   11/17/22 1558              Home Health: None Equipment/Devices: None  Discharge Condition: Stable CODE STATUS: Full code Diet recommendation: Cardiac  Subjective: Patient seen and examined.  She has no complaints.  She has been excited about going to SNF.  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  Patient admitted for the management of intractable nausea and vomiting.  Hospital course also  remarkable for fever.  Presumed to have acute blood pneumonia.  Started on antibiotics, cultures sent.  Etiology of nausea vomiting was unclear.  She was treated symptomatically, has not had any vomiting for more than 24 hours does not have any nausea and tolerating diet.  Admitting hospitalist had discussed with patient's pulmonologist Dr. Marchelle Gearing who does not believe that patient's symptoms are secondary to nintedanib however recommended holding this for 2 weeks and follow-up with PCCM in 10 days.  As far as pneumonia goes, patient has mild afebrile since last 2 to 3 days, has no shortness of breath.  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  She received 3 days of IV antibiotics, Rocephin and Zithromax, she is being discharged on cefdinir for 5 more days.   History of rheumatoid arthritis: Follows [facility].  On dexamethasone at home.   Hypokalemia: Resolved.   Hypocalcemia: Supplemented   Hypertension: Resume home medications.  Hyperlipidemia: On Lipitor at home   History of depression: On Effexor   Constipation: Continue bowel regimen.   Generalized weakness: Patient lives [name]  PT evaluated and recommends SNF.  Which has been arranged for her and she is going to be discharged to SNF.  Patient and daughter in agreement.  Discharge plan was discussed with patient and/or family member and they  verbalized understanding and agreed with it.  Discharge Diagnoses:  Principal Problem:   Nausea & vomiting Active Problems:   Rheumatoid arthritis (HCC)   Osteopenia   Restrictive lung disease   Intractable nausea and vomiting    Discharge Instructions   Allergies as of 11/20/2022       Reactions   Penicillins Other (See Comments)   Remicade [infliximab] Other (See Comments)   Reaction unknown        Medication List     STOP taking these medications     sulfamethoxazole-trimethoprim 800-160 MG tablet Commonly known as: BACTRIM DS       TAKE these medications    amitriptyline 25 MG tablet Commonly known as: ELAVIL Take 25 mg by mouth at bedtime.   Biotin 1000 MCG tablet Take 1,000 mcg by mouth daily.   Breo Ellipta 100-25 MCG/ACT Aepb Generic drug: fluticasone furoate-vilanterol Inhale 1 puff into the lungs daily.   cefdinir 300 MG capsule Commonly known as: OMNICEF Take 1 capsule (300 mg total) by mouth 2 (two) times daily for 5 days.   cholecalciferol 1000 units tablet Commonly known as: VITAMIN D Take 1,000 Units by mouth daily.   gabapentin 300 MG capsule Commonly known as: NEURONTIN Take 600 mg by mouth at bedtime.   losartan 25 MG tablet Commonly known as: COZAAR Take 25 mg by mouth daily.   methylPREDNISolone 4 MG tablet Commonly known as: MEDROL Take 1 tablet (4 mg total) by mouth every other day. Alternate with the 2mg  dose What changed:  when to take this additional instructions   Ofev 100 MG Caps Generic drug: Nintedanib Take 1 capsule (100 mg total) by mouth 2 (two) times daily. Start taking on: Dec 02, 2022 What changed: These instructions start on Dec 02, 2022. If you are unsure what to do until then, ask your doctor or other care provider.   ondansetron 4 MG tablet Commonly known as: ZOFRAN Take 1 tablet (4 mg total) by mouth every 8 (eight) hours as needed for nausea or vomiting.   pantoprazole 40 MG tablet Commonly known as: PROTONIX Take 1 tablet (40 mg total) by mouth 2 (two) times daily before a meal.   pravastatin 20 MG tablet Commonly known as: PRAVACHOL Take 1 tablet (20 mg total) by mouth daily.   venlafaxine XR 75 MG 24 hr capsule Commonly known as: EFFEXOR-XR Take 75 mg by mouth at bedtime.        Follow-up Information     Stacey Corn, MD Follow up in 1 week(s).   Specialty: Internal Medicine Contact information: [address] [place]  [address] [phone]                Allergies  Allergen Reactions   Penicillins Other (See Comments)   Remicade [Infliximab] Other (See Comments)    Reaction unknown    Consultations: Pulmonology   Procedures/Studies: CT ABDOMEN PELVIS W CONTRAST  Result Date: 11/17/2022 CLINICAL DATA:  Acute abdominal pain and vomiting. EXAM: CT ABDOMEN AND PELVIS WITH CONTRAST TECHNIQUE: Multidetector CT imaging of the abdomen and pelvis was performed using the standard protocol following bolus administration of intravenous contrast. RADIATION DOSE REDUCTION: This exam was performed according to the departmental dose-optimization program which includes automated exposure control, adjustment of the mA and/or kV according to patient size and/or use of iterative reconstruction technique. CONTRAST:  80mL OMNIPAQUE IOHEXOL 300 MG/ML  SOLN COMPARISON:  CT abdomen and pelvis 07/03/2019. CT chest high-resolution 03/04/2020 FINDINGS: Lower chest: Peripheral interstitial and  ground-glass opacities are seen in both lung bases, increased from prior. Hepatobiliary: No focal liver abnormality is seen. Status post cholecystectomy. No biliary dilatation. Pancreas: Unremarkable. No pancreatic ductal dilatation or surrounding inflammatory changes. Spleen: Normal in size without focal abnormality. Adrenals/Urinary Tract: Adrenal glands are unremarkable. Kidneys are normal, without renal calculi, focal lesion, or hydronephrosis. Bladder is unremarkable. Stomach/Bowel: There is a small hiatal hernia. Stomach is otherwise within normal limits. Appendix appears normal. No evidence of bowel wall thickening, distention, or inflammatory changes. There is a large amount of stool throughout the colon. Vascular/Lymphatic: Aortic atherosclerosis. No enlarged abdominal or pelvic lymph nodes. Reproductive: Uterus and bilateral adnexa are unremarkable. Other: No abdominal wall hernia or abnormality. No abdominopelvic ascites.  Musculoskeletal: No acute or significant osseous findings. IMPRESSION: 1. No acute process in the abdomen or pelvis. 2. Large amount of stool throughout the colon. 3. Small hiatal hernia. 4. Peripheral interstitial and ground-glass opacities in both lung bases, increased from prior. Findings are favored to represent sequela of chronic interstitial lung disease. Aortic Atherosclerosis (ICD10-I70.0). Electronically Signed   By: Darliss Cheney M.D.   On: 11/17/2022 18:32   DG Chest Portable 1 View  Result Date: 11/17/2022 CLINICAL DATA:  Shortness of breath EXAM: PORTABLE CHEST 1 VIEW COMPARISON:  X-ray 01/01/2022 FINDINGS: Stable cardiopericardial silhouette. Calcified aorta. Osteopenia with scattered degenerative changes. Diffuse interstitial changes seen of the lungs, likely chronic. There is more focal opacity liver seen at the right lung base. A superimposed acute process is possible. No pneumothorax or effusion. Surgical clips in the right upper quadrant of the abdomen. IMPRESSION: Extensive interstitial changes, likely chronic but there is more focal opacity in the right lung base. An acute process is not excluded. Recommend follow-up. Electronically Signed   By: Karen Kays M.D.   On: 11/17/2022 17:13     Discharge Exam: Vitals:   11/20/22 0400 11/20/22 0803  BP: 122/67 (!) 152/69  Pulse: 65 64  Resp:    Temp: 98.1 F (36.7 C) 97.7 F (36.5 C)  SpO2: 95% 97%   Vitals:   11/19/22 1716 11/19/22 2029 11/20/22 0400 11/20/22 0803  BP: 136/65 (!) 130/59 122/67 (!) 152/69  Pulse: 65 73 65 64  Resp:      Temp: 97.7 F (36.5 C) (!) 97.5 F (36.4 C) 98.1 F (36.7 C) 97.7 F (36.5 C)  TempSrc: Oral Oral Oral Oral  SpO2: 95% 100% 95% 97%  Weight:      Height:        General: Pt is alert, awake, not in acute distress Cardiovascular: RRR, S1/S2 +, no rubs, no gallops Respiratory: Scattered rhonchi bilaterally. Abdominal: Soft, NT, ND, bowel sounds + Extremities: no edema, no  cyanosis    The results of significant diagnostics from this hospitalization (including imaging, microbiology, ancillary and laboratory) are listed below for reference.     Microbiology: Recent Results (from the past 240 hour(s))  Culture, blood (Routine X 2) w Reflex to ID Panel     Status: None (Preliminary result)   Collection Time: 11/18/22 10:40 AM   Specimen: BLOOD  Result Value Ref Range Status   Specimen Description   Final    BLOOD BLOOD RIGHT WRIST Performed at [facility], [address] [place], [place] [address]    Special Requests   Final    BOTTLES DRAWN AEROBIC AND ANAEROBIC Blood Culture results may not be optimal due to an excessive volume of blood received in culture bottles Performed at [facility], [address] [place], [place] [address]    Culture   Final    NO GROWTH 2 DAYS Performed at [facility], [address] [place], [place] [address]    Report Status PENDING  Incomplete  Culture, blood (Routine X 2) w Reflex to ID Panel     Status: None (Preliminary result)   Collection Time: 11/18/22 10:49 AM   Specimen: BLOOD  Result Value Ref Range Status   Specimen Description   Final    BLOOD BLOOD RIGHT FOREARM Performed at [facility], [address] [place], [place] [address]    Special Requests   Final    BOTTLES DRAWN AEROBIC AND ANAEROBIC Blood Culture results may not be optimal due to an excessive volume of blood received in culture bottles Performed at [facility], [address], [place], [place] [address]    Culture   Final    NO GROWTH 2 DAYS Performed at [facility], [address], [place], [place] [address]    Report Status PENDING  Incomplete     Labs: BNP (last 3 results) No results for input(s): "BNP" in the last 8760 hours. Basic Metabolic Panel: Recent Labs  Lab 11/17/22 1552 11/18/22 0443 11/19/22 0838  NA 135 133* 134*  K 3.6  3.2* 3.9  CL 98 104 101  CO2 27 21* 25  GLUCOSE 105* 78 151*  BUN 13 10 10   CREATININE 0.82 0.61 0.69  CALCIUM 8.5* 6.8* 7.9*   Liver Function Tests: Recent Labs  Lab 11/17/22 1552 11/18/22 0443  AST 27 23  ALT 15 12  ALKPHOS 52 40  BILITOT 0.6 0.7  PROT 6.4* 5.0*  ALBUMIN 3.8 2.7*   Recent Labs  Lab 11/17/22 1552  LIPASE 25   No results for input(s): "AMMONIA" in the last 168 hours. CBC: Recent Labs  Lab 11/17/22 1552 11/18/22 0443 11/19/22 0838  WBC 7.7 6.8 3.7*  HGB 13.7 12.0 12.5  HCT 42.1 37.8 38.3  MCV 93.1 94.5 91.4  PLT 224 181 201   Cardiac Enzymes: No results for input(s): "CKTOTAL", "CKMB", "CKMBINDEX", "TROPONINI" in the last 168 hours. BNP: Invalid input(s): "POCBNP" CBG: No results for input(s): "GLUCAP" in the last 168 hours. D-Dimer No results for input(s): "DDIMER" in the last 72 hours. Hgb A1c No results for input(s): "HGBA1C" in the last 72 hours. Lipid Profile No results for input(s): "CHOL", "HDL", "LDLCALC", "TRIG", "CHOLHDL", "LDLDIRECT" in the last 72 hours. Thyroid function studies No results for input(s): "TSH", "T4TOTAL", "T3FREE", "THYROIDAB" in the last 72 hours.  Invalid input(s): "FREET3" Anemia work up No results for input(s): "VITAMINB12", "FOLATE", "FERRITIN", "TIBC", "IRON", "RETICCTPCT" in the last 72 hours. Urinalysis    Component Value Date/Time   COLORURINE YELLOW 10/08/2021 1958   APPEARANCEUR CLEAR 10/08/2021 1958   LABSPEC 1.010 10/08/2021 1958   PHURINE 7.0 10/08/2021 1958   GLUCOSEU NEGATIVE 10/08/2021 1958   HGBUR NEGATIVE 10/08/2021 1958   BILIRUBINUR NEGATIVE 10/08/2021 1958   KETONESUR NEGATIVE 10/08/2021 1958   PROTEINUR NEGATIVE 10/08/2021 1958   UROBILINOGEN 0.2 12/18/2009 0120   NITRITE NEGATIVE 10/08/2021 1958   LEUKOCYTESUR TRACE (A) 10/08/2021 1958   Sepsis Labs Recent Labs  Lab 11/17/22 1552 11/18/22 0443 11/19/22 0838  WBC 7.7 6.8 3.7*   Microbiology Recent Results (from the past  240 hour(s))  Culture, blood (Routine X 2) w Reflex to ID Panel     Status: None (Preliminary result)   Collection Time:  11/18/22 10:40 AM   Specimen: BLOOD  Result Value Ref Range Status   Specimen Description   Final    BLOOD BLOOD RIGHT WRIST Performed at [facility], [address] [place], [place] [address]    Special Requests   Final    BOTTLES DRAWN AEROBIC AND ANAEROBIC Blood Culture results may not be optimal due to an excessive volume of blood received in culture bottles Performed at [facility], [address], [place], [place] [address]    Culture   Final    NO GROWTH 2 DAYS Performed at [facility], [address], [place], [place] [address]    Report Status PENDING  Incomplete  Culture, blood (Routine X 2) w Reflex to ID Panel     Status: None (Preliminary result)   Collection Time: 11/18/22 10:49 AM   Specimen: BLOOD  Result Value Ref Range Status   Specimen Description   Final    BLOOD BLOOD RIGHT FOREARM Performed at [facility], [address] [place], [place] [address]    Special Requests   Final    BOTTLES DRAWN AEROBIC AND ANAEROBIC Blood Culture results may not be optimal due to an excessive volume of blood received in culture bottles Performed at [facility], [address], [place], [place] [address]    Culture   Final    NO GROWTH 2 DAYS Performed at [facility], [address] [place], [place] [address]    Report Status PENDING  Incomplete     Time coordinating discharge: Over 30 minutes  SIGNED:   Hughie Closs, MD  [facility] 11/20/2022, 9:02 AM *Please note that this is a verbal dictation therefore any spelling or grammatical errors are due to the "Dragon Medical One" system interpretation. If 7PM-7AM, please contact night-coverage [email or web address]

## 2022-11-20 NOTE — Care Management Important Message (Signed)
Important Message  Patient Details  Name: GENEVE PELLICANE MRN: [id number] Date of Birth: 02-13-1941   Medicare Important Message Given:  Yes     Dorena Bodo 11/20/2022, 3:28 PM

## 2022-11-20 NOTE — TOC Transition Note (Signed)
Transition of Care [facility]) - CM/SW Discharge Note   Patient Details  Name: Stacey [name] MRN: [id number] Date of Birth: 05-24-1941  Transition of Care [facility] CM/SW Contact:  Torunn Chancellor A [place], Theresia Majors Phone Number: 11/20/2022, 1:56 PM   Clinical Narrative:     Patient will DC to: [facility]  Anticipated DC date: 11/20/22  Family notified: Alycia Patten  Transport by: Sharin Mons      Per MD patient ready for DC to [facility]. RN, patient, patient's family, and facility notified of DC. Discharge Summary and FL2 sent to facility. RN to call report prior to discharge (Room # 606p Report# [id number]). DC packet on chart. Ambulance transport requested for patient.     CSW will sign off for now as social work intervention is no longer needed. Please consult [place] again if new needs arise.    Final next level of care: Skilled Nursing Facility Barriers to Discharge: No Barriers Identified   Patient Goals and CMS Choice      Discharge Placement                Patient chooses bed at: [facility] Patient to be transferred to facility by: [facility] Name of family member notified: Loana Wafer Patient and family notified of of transfer: 11/20/22  Discharge Plan and Services Additional resources added to the After Visit Summary for                                       Social Determinants of Health (SDOH) Interventions SDOH Screenings   Food Insecurity: No Food Insecurity (11/18/2022)  Housing: Low Risk  (11/18/2022)  Transportation Needs: No Transportation Needs (11/18/2022)  Utilities: Not At Risk (11/18/2022)  Depression (PHQ2-9): Low Risk  (10/24/2020)  Tobacco Use: Low Risk  (11/17/2022)     Readmission Risk Interventions     No data to display

## 2022-11-22 LAB — CULTURE, BLOOD (ROUTINE X 2)

## 2022-11-23 ENCOUNTER — Inpatient Hospital Stay: Payer: Medicare Other | Admitting: Nurse Practitioner

## 2022-11-23 LAB — CULTURE, BLOOD (ROUTINE X 2)

## 2022-11-26 DIAGNOSIS — F329 Major depressive disorder, single episode, unspecified: Secondary | ICD-10-CM | POA: Diagnosis not present

## 2022-11-26 DIAGNOSIS — I10 Essential (primary) hypertension: Secondary | ICD-10-CM | POA: Diagnosis not present

## 2022-11-26 DIAGNOSIS — J849 Interstitial pulmonary disease, unspecified: Secondary | ICD-10-CM | POA: Diagnosis not present

## 2022-11-26 DIAGNOSIS — J9611 Chronic respiratory failure with hypoxia: Secondary | ICD-10-CM | POA: Diagnosis not present

## 2022-12-11 ENCOUNTER — Ambulatory Visit: Payer: Medicare Other | Admitting: Internal Medicine

## 2022-12-11 ENCOUNTER — Ambulatory Visit (INDEPENDENT_AMBULATORY_CARE_PROVIDER_SITE_OTHER): Payer: Medicare Other | Admitting: Internal Medicine

## 2022-12-11 ENCOUNTER — Encounter: Payer: Self-pay | Admitting: Internal Medicine

## 2022-12-11 ENCOUNTER — Telehealth: Payer: Self-pay | Admitting: Internal Medicine

## 2022-12-11 VITALS — BP 140/70 | HR 72 | Ht 65.0 in | Wt 128.2 lb

## 2022-12-11 DIAGNOSIS — R634 Abnormal weight loss: Secondary | ICD-10-CM | POA: Diagnosis not present

## 2022-12-11 DIAGNOSIS — Z79899 Other long term (current) drug therapy: Secondary | ICD-10-CM

## 2022-12-11 DIAGNOSIS — J849 Interstitial pulmonary disease, unspecified: Secondary | ICD-10-CM | POA: Diagnosis not present

## 2022-12-11 DIAGNOSIS — T50905A Adverse effect of unspecified drugs, medicaments and biological substances, initial encounter: Secondary | ICD-10-CM

## 2022-12-11 DIAGNOSIS — R112 Nausea with vomiting, unspecified: Secondary | ICD-10-CM

## 2022-12-11 NOTE — Progress Notes (Signed)
Brief patient profile:  73 yowf  never smoker with allergies/inhalers as child outgrew by [employer] then  RA since around 2000  Prednisone  x decades and prev eval by Dr Delford Field around 2004 for sob resolved s maint rx and referred 05/26/2013 by Dr Renne Crigler for bronchitis and abn cxr   History of Present Illness  05/26/2013 1st [facility] office visit/ Wert cc June 2014 dx pna  In [place] and remicade stopped and 100% better and placed arencia in September 2014  then abruptly worse first week in November with cough green sputum s nasal symptoms, fever low grade and no cp or cough and completely recovered prior to OV does not recall abx but issue is why keeps getting sick and abn CT Chest (see below).   Arthritis symptoms well controlled at present on Rx for RA rec Nexium 40 mg Take 30-60 min before first meal of the day and add pepcid 20 mg one at bedtime whenever coughing.     10/19/2017  f/u ov/Wert re:  RA lung dz  Chief Complaint  Patient presents with   Follow-up    Cough is much improved, but has not resolved yet. Cough is non prod. She has not had to use her neb.   Dyspnea:  Not limited by breathing from desired activities  But some doe x steps Cough: daytime > noct dry  Sleep: fine  SABA use:  No saba Medrol 4 mg a/w 2mg  per day/ ok control of arthritis  rec Start back on gabapentin up to 300 mg each am  in addition to the the two at bedtime  If not better increase the medrol to 8 mg daily until bettter then taper back to where you      01/10/2018  f/u ov/Wert re:  RA  Lung dz Medrol  4 mg  One alternating with a half Chief Complaint  Patient presents with   Follow-up    PFT's done. Her breathing has been gradually worsening since the last visit. She has occ cough- non prod.   Dyspnea gradually worse since last ov:  MMRC1 =  MMRC3 = can't walk 100 yards even at a slow pace at a flat grade s stopping due to sob    Gradually x 3 m / more fatigue / no change in  arthritis  Cough: not an issue rec Protonix 40 mg Take 30-60 min before first meal of the day  GERD diet   01/17/2018 acute extended ov/Wert re: cough on medrol 4 mg  One a/w one half  Chief Complaint  Patient presents with   Acute Visit    started coughing 01/11/18- occ prod with minimal green sputum.  She states also wheezing and having increased SOB.    abruptly worse 01/11/18 with severe 24/7 coughing >>  prod min green mucus esp in am/ assoc with subjective wheeze and did not follow previous contingencies re flutter / saba/ increase medrol and admits she does not rember those written instructions nor how to use the neb provided .  No fever/ comfortable at rest sitting  rec For cough > mucinex dm 1200 mg every 12 hours and cough into the flutter valve as much as possible  Doxycycline 100 mg twice daily x 10 days with glass of water Medrol 4mg  x 2 now and take 2  daily until cough is better then 1 daily x 5 days and then resume the previous dose  Shortness of breath/  wheezing/ still coughing > albuterol neb every 4 hours as needed      01/20/2018 acute extended ov/Wert re: refractory cough and sob 01/11/18 Chief Complaint  Patient presents with   Acute Visit    she is not feeling better, coughing , very SOB, wheezing  mucus now clear/scant  on doxy/ neb machine not working (tube would not plug into the side s adequate force and she was not capable of applying it due to RA hands. Cough/ wheeze/ sob 24/7 / flutter not helping/ can't lie down at hs   rec While coughing protonix 40 mg Take 30- 60 min before your first and last meals of the day  Shortness of breath/ wheezing/ still coughing > albuterol neb every 4 hours as needed  Depomedrol 120 mg IM and medrol 32 mg daily x 2 days,  then 16 mg x 3 days,  Then 8 mg x 4 days , then resume the 4 mg daily  For severe cough > tylenol 3# one every 4 hours if needed  Go to ER if condition worsens on above plan       Date of admission: 01/22/2018              Date of discharge: 01/27/2018   History of present illness: As per the H and P dictated on admission, " Christyl Thien  is a 82 y.o. female, w Rheumatoid arthritis, ILD Asthma, apparently c/o increase in dyspnea this evening. Dry cough.   Denies fever, chills, cp, palp,  N/v, diarrhea, brbpr, black stool.   Pt notes recently being given steroid injection in office as well as being placed on doxycycline. This might have helped slightly but pt worse  Hospital Course:  Summary of her active problems in the hospital is as following. 1 dyspnea/hypoxemia/ILD Concerned that likely GERD Is causing ILD. Patient with cough.   Patient with complaints of awakening with cough and also with oral intake which is slightly improved since 01/24/2018. assessed by speech therapy and speech therapy raising concern of esophageal component but no signs of aspiration.   2D echo with a EF of 55 to 60% with no wall motion abnormalities, grade 1 diastolic dysfunction.  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 Pulmonary felt that the patient should be on scheduled Reglan. I have placed the patient on scheduled potassium before sleep. Continue steroids on discharge continue Mucinex and Claritin as well as inhalers. Patient will follow-up with pulmonary outpatient   2.  Gastroesophageal reflux disease Continue PPI and H2 blocker.  I changed PPI to [facility].   3.  Rheumatoid arthritis Outpatient follow-up.     4.  Anxiety Continue Effexor.       All other chronic medical condition were stable during the hospitalization.  Patient was ambulatory without any assistance. On the day of the discharge the patient's vitals were stable , and no other acute medical condition were reported by patient. the patient was felt safe to be discharge at home with family.   Consultants: PCCM  Procedures: Echocardiogram       03/21/2018  f/u ov/Wert re:   S/p admit was transiently  better  and downhill since Labor day on medrol 4 mg daily  Last orencia on Sept 4th 2019  Chief Complaint  Patient presents with   Acute Visit    Per patient, she has had a dry cough since July 2019. She has been wheezing as well. Increased fatigue. Body aches. Denies any fever or  chills.   Dyspnea:  MMRC4  = sob if tries to leave home or while getting dressed   Cough: harsh/ hacking mostly dry/ has flutter not using    SABA use: not much better with rx   No obvious day to day or daytime variability or assoc excess/ purulent sputum or mucus plugs or hemoptysis or cp or chest tightness, subjective wheeze or overt sinus or hb symptoms.     Also denies any obvious fluctuation of symptoms with weather or environmental changes or other aggravating or alleviating factors except as outlined above   No unusual exposure hx or h/o childhood pna/ asthma or knowledge of premature birth.   INpatient consult 03/26/18 82 year old with rheumatoid arthoritis.At baseline the patient lives at home with her husband and is independent of ADLs.  Has been on many immune suppressants over decades and curently on orencia x 4 year and prednisone. Does not recollect being on bactrim/dapsone. Chart mentions BOOP/MAI in 2001 but she denies this. 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 Got admitted. Rx with steroids. Per Notes - clnical suspicion of  arytenoid inflmmation related wheeze noticed (she also reports asthma NOS). Follolwup with ENT recommended (but not seen one as yet). She also appears to have passed swallow with rec for regular diet with thin liquids but did to have mild eso stricture and reflux during testing . PFT shows 10% FVC decline for first ime. CT chest at this tme (aug 2019) showed new rLL infiltrate.  ECHO July 2019 without evicence of elevated  PASP and saw cards [facility] June 2019 and was considered to have worsenin dyspnea due to [facility] issues (reports stress test at [facility] that was normal but I cannot see it)   She reports after discharge she got better but in last several weeks has deteriorated with cough and dyspnea. There is new hypoxemia (currently RA with nail polish and poor circulation  - 89% pulse ox) needing 2L [place]. Per [facility] improved with steroids and abx. CTA 03/24/2018 => shows that RLL inifltrate has improved . Other chronic ILD changes + and small  Hiatal hernia + witthout change.   Review of lab work does show eosinophilia at time of admision   EVENTS 03/21/18 - IgE -5, blood allegy panel - negative, 03/23/2018 - - admit . HIGH EOS 2300, ESR 48, BNP 89 , HIV neg 9/12- PCT negative, RVP negative 9/14 -  PCT < 0.1, Urine strep - negative, MRSA PCR - positive. IgE - normal 4, Blood allergy panel repeat - negative 03/26/2018 - leading consideration for airway (BO in RA +/- asthma) related flare either due to MRSA bronchitis or clinical suspicion of arytenoid inflmmation +/- GERD relatd flare (she has small hiatal hernia)  +/- ? Dysphagia  up causing mild hypoxemia acute resp failure, wheeze . Allergy and IgE blood work negative thought. Patient reports being better but says she is choking on drinkin water [facility] MD says wheeze improved significantly with steroids.  RN says was down to RA yesterday evening but needed 1L [place] at sleep. Today -Room air at rest 94% and desaturated to 86% walking 60 feet 03/27/2018  - better. Off o2 at rest. STill coughs with water and when lies down.  Husband at bedside. Both requesting ILD clinic followup . Desaturated t 79% walking 90 feet.  OV 04/12/2018  Subjective:  Patient ID: Stacey [name], female , DOB: June 24, 1941 , age 82 y.o. , MRN: [id number] , ADDRESS: [address] [place] [address]   04/12/2018 -   Chief Complaint  Patient presents with   Consult    Pt is a former [facility] pt.  Pt  denies any current complaints of cough, SOB, or CP but states the cough she originally had ended her up in the hosp 9/11-9/17 with dx acute respiratory failure. Pt does wear 2pulse with exertion and also wears 2L continuous when at home.     HPI ALIKI SANTIZO 82 y.o. -presents for follow-up to the ILD clinic.  She is known to have rheumatoid arthritis with ILD changes.  She had been followed by Dr. Sandrea Hughs.  However in July 2019 in September 2019 she has had 2 admissions to the hospital with respiratory distress and hypoxemic respiratory failure.  In the first 1 that seem to be right lower lobe infiltrate and then she improved from it but in the second 1 even though the right lower lobe infiltrates were better she still was hypoxemic.  Acid reflux and dysphagia was considered a possible etiology but she passed swallow study 2 times.  They thought she had some reflux.  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  At the same time it is not clear if the ILD is progressive based on pulmonary function testing below   At this point in time she tells me that she is getting home physical therapy.  Her fatigue is improving but it is not fully resolved.  She was discharged on continuous oxygen which she is using.  However she is feeling less short of breath.  Today in fact when we turned her oxygen off and walked her she did not desaturate and this is a significant improvement.  She is on monthly Orencia through the [facility] rheumatologist Dr. Rexene Agent.  At this point in time she is put the Orencia on hold.  She told me that she is been on Orencia for 4 years and never had a respiratory exacerbation still recently x 2.  Although before going on Orencia she had pneumonia while on Remicade and the Remicade.  In terms of her rheumatoid arthritis she hardly has any pain.  Her joint architecture is fairly well-preserved because of various immunomodulators over  time.  She says that she was on Remicade for years and when she stopped it for 8 weeks before the switch to Orencia she never really had a relapse in her rheumatoid arthritis.  She is largely pain and stiffness free.  She believes she can go without  her Orencia for a while.  Review of the literature shows greater than 10% chance of a respiratory infection especially COPD exacerbation.  Although the time frame for this is unclear.       OV 06/07/2018  Subjective:  Patient ID: Stacey [name], female , DOB: 01-Jun-1941 , age 4 y.o. , MRN: [id number] , ADDRESS: [address] [place] [address]   06/07/2018 -   Chief Complaint  Patient presents with   Follow-up    ILD, PFT done today, some wheezing and coughing but better tha before   Rheumatoid arthritis ILD and asthma/obstructive lung disease phenotype on Dulera  HPI SHONDI LEONELLI 82 y.o. -presents for routine follow-up with her husband.  She is here to follow-up with [facility] Dr. Aundria Rud.  She plans to  do this in December 2019.  She continues to be off Orencia.  Her joints are slowly getting stiff again.  She believes that she will need to be back on immunosuppression agent again.  She currently continues Medrol 4 mg alternating with 2 mg.  This for her rheumatoid arthritis.  In terms of her joints she continues on Medrol 4 mg alternating with 2 mg but not on any other immunosuppression agent.  Overall she is been stable but for the last 2 weeks has had green sputum and wheezing and chest congestion and cough.  She recently visited her husband who was hospitalized and walking the long hallways at [facility] made a short of breath but she thinks this is probably baseline for her.  There are no other new issues.  She did have spirometry and DLCO and this shows a decline compared to September 2019 and a similar to July 2019.  It is documented below.  This is probably reflective of a flareup   OV  07/21/2018  Subjective:  Patient ID: Stacey [name], female , DOB: [birth year] , age 74 y.o. , MRN: [id number] , ADDRESS: [address] [place] [address]   07/21/2018 -   Chief Complaint  Patient presents with   Follow-up    Pt states she has been doing well since last visit. States she is about to begin Rituxan with [facility]. Pt still becomes SOB with exertion. Denies any complaints of cough or CP.   Rheumatoid arthritis ILD and asthma/obstructive lung disease phenotype on Dulera  HPI ANAHAT OMANA 82 y.o. -presents for follow-up of the above.  Last seen just before Thanksgiving 2019.  In the interim overall stable although on June 23, 2018 she climbs a steep flight of stairs which is unusual exertion for her and she became very dyspneic.  Following day saw Dr. Aundria Rud at [facility] rheumatology and was given Z-Pak and prednisone and started feeling better.  Although it is not fully clear to me she had fever and bronchitic symptoms.  I reviewed Dr. Aundria Rud note.  Dr. Aundria Rud is decided to start Rituxan for rheumatoid arthritis.  She is only having some minimal joint pain at this point.  She is off Orencia and continues to be off Orencia.  She did have some blood work with [place] before starting Rituxan.  She is due to see Dr. Aundria Rud within the next week and start her Rituxan.  Her liver function test July 18, 2017 is normal hemoglobin is normal.  CRP is also normal.  We did spirometry and walking desaturation test.  These show improvement compared to before and these are documented below.  Currently she not using nighttime or daytime oxygen.  She is wondering if she could switch rheumatology care to [facility].  This is because while she likes [employer] she is getting older and more frail and feels some body local would be of help.  I have sent a message to Dr. Corliss Skains inquiring.  Certainly we can help her with Rituxan infusions at [facility] if needed.  She  will check on this with her [facility] rheumatologist.       OV 03/27/2019  Subjective:  Patient ID: Stacey [name], female , DOB: 12-21-1940 , age 55 y.o. , MRN: [id number] , ADDRESS: [address] [place] [address]  Rheumatoid arthritis ILD and asthma/obstructive lung disease phenotype on [facility]   03/27/2019 -  Rourine fu   HPI KARIANA SIELSKI 82  y.o. -presents for the above.  Last seen in January 2020.  After that she has seen Dr. Aundria Rud rheumatology at [facility].  She is now getting Rituxan 2 doses every 6 months.  She says this is helped her joints and her stiffness.  She is a little bit more mobile than usual.  However in terms of her respiratory status she continues to have episodic cough.  In June 2020 she again got hypoxemic and got admitted.  Since then she has had episodic cough.  She had a respiratory exacerbation in June 2020 for the admission she got steroids.  This seemed to help.  She is also on a higher dose Dulera right now.  In terms of her cough this seems to be her biggest problem.  She seems to be on Dulera, Singulair scheduled with also Tessalon and Delsym and DuoNeb.  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  Her last high-resolution CT scan of the chest was 1 year ago.  She says the dyspnea itself is not worse.  She is currently not using oxygen.  On exam she did have some wheezing.  Currently she has white and brown sputum but this is baseline.  In terms of a COVID wrist she has been tested recently couple of times and this is been negative.  She is isolating well.  She wanted to know COVID prevention activities and risk status and masking strategies.      OV 05/24/2019  Subjective:  Patient ID: Stacey [name], female , DOB: 1941-05-26 , age 81 y.o. , MRN: [id number] , ADDRESS: [address] [place] [address]   05/24/2019 -   Chief  Complaint  Patient presents with   Follow-up    Pt was recently in the hosp due to ILD. Pt states that she has been better since being out of the hosp.   Rheumatoid arthritis ILD and asthma/obstructive lung disease phenotype on Dulera/Singulair  Post herpetic neuralgia - on elavil, gabapentin, effexor  HPI MYCHELE SCHOCH 82 y.o. -returns for follow-up.  At the last visit approximately 2 months ago she was reporting worsening cough following an admission in summer 2020.  Her pulmonary function test suggested worsening ILD status.  Therefore we requested a high-resolution CT chest which she did in October 2020.  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  However in the interim after the CT scan was done towards the end of October 2020 she developed worsening of her cough over 2 weeks and also associated shortness of breath but significantly the cough is much worse.  She ended up getting admitted to the hospital.  There was some hypoxemia.  By this time she had finished a ENT evaluation that did not show any involvement of the arytenoids.  Pulmonary was consulted.  She was given a prednisone burst which she just finished I believe yesterday.  She is back on her baseline Medrol.  And she is feeling better.  Her oxygen status is improved although she is using oxygen at night now.  She is really frustrated with these recurrent flareups and these admissions which ended up with her having a wheeze and also hypoxemia.  Currently she is on her baseline Medrol for rheumatoid arthritis associated with Dulera and Singulair.  She is also on losartan for blood pressure.  Her walking desaturation test is slightly  worse than baseline.  She has a GI consult pending because of the recurrent episodes of cough and flareups.  We went over exposure history.  We used interstitial lung disease questionnaire for the exposure history.  Specifically she denies any electronic cigarette use of  marijuana use of cocaine use or any IV drug abuse.  [place] the last 14 years.  Asked extensive questions about the home environment it is positive for nebulizer use but the nebulizer does not have mildew or mold in it.  Otherwise no organic antigen exposure.  The house is not damp.  There is no mold or mildew in the shower curtain.  There is no humidifier use no steam iron use.  No Jacuzzi use.  No misting [place] outside to inside the house.  No pet birds.  No pet gerbils no feather pillows.  There is no mold in the [facility] duct.  She does not do any gardening.  Does not use wind instruments.  Also 122 question occupational history elicited and essentially negative.  The other issue is that she has polypharmacy.  She is asking for my help in reducing her medications.  She is on 3 medications for postherpetic neuralgia.  She is on losartan      OV 06/21/2019  Subjective:  Patient ID: Stacey [name], female , DOB: 03/31/1941 , age 76 y.o. , MRN: [id number] , ADDRESS: [address] [place] [address]   06/21/2019 -   Chief Complaint  Patient presents with   televisit    hosp 11/29-12/4 due to covid with pna. pt said that she is doing okay after recent hosp but states she has no energy.      JALAN HILDER 82 y.o. - last visit 05/24/2019. Diagnosed with covid-19 on 05/31/2019. Admitted 06/11/2019  - 06/16/2019. Quarantine ends 06/21/2019 today. Rx with Remdesviri and Decadron. Husband also on phone. Questions  1. Quarantine ends - from 06/22/19\ and she is not contagious and likely resistant to reinfection to covid for another few months  2. She is on 10 day dexamethasone for covid - today is last day for it  3. She is on dulera. Hospital gave combivent and she does not want do combivent - this is fine  4. Ofev for ILD - not started it yet . She had questions about side effects. Explained GI and LFT monirtong. SHe wanted to  wait till Jan 2021 and start it . I am ok with that.   5. Small hiat   IMPRESSION: HRCT OCt 2020 1. 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 Findings have progressed since 2017 and 2019 high-resolution chest CT studies. 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 Findings are consistent with UIP per consensus guidelines: Diagnosis of Idiopathic Pulmonary Fibrosis: An Official ATS/ERS/JRS/ALAT Clinical Practice Guideline. Am Rosezetta Schlatter Crit Care Med Vol 198, Iss 5, [phone], Mar 13 2017. 2. 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 4. Small hiatal hernia.   Aortic Atherosclerosis (ICD10-I70.0).     Electronically Signed   By: Delbert Phenix M.D.   On: 04/24/2019 13:29    OV 08/03/2019 - face to face visit  Subjective:  Patient ID: Stacey [name], female , DOB: Dec 06, 1940 , age 58 y.o. , MRN: [id number] , ADDRESS: [address] [place] [address]    Rheumatoid arthritis with  Joint s/ILD- Rituxan/steroids ([facility])  - progressive phenotype (CT 2017/2019 -> Oct 2020). Planned Ofev start in Nov 2020 but did not start as of dec 2020. LFT normal Jan 2021  Athma/obstructive lung disease phenotype on Dulera/Singulair  Post herpetic neuralgia - on elavil, gabapentin, effexor  Coronary Artery Calcification - 1 vessel - cardiology referral done  Diagnosed with covid-19 on 05/31/2019. Admitted 06/11/2019  - 06/16/2019. Rx with Remdesviri and Decadron.  Small Hiatal hernia onCT Oct 2020  Rib fracture from fall Dec 2020: Musculoskeletal: There is nondisplaced fractures of the posterior left fifth, 6, 8, 9, and tenth left ribs. There is unchanged slight superior compression deformities of the T5, T6, T10 vertebral bodies with less than 25% loss in height.Associated pulmnary contusion  +    HPI STARLETTA MORUA 82 y.o. -presents for a visit. Her nintedanib has been delayed because of COVID-19. Also subsequently a fall. She saw a nurse practitioner on July 20, 2019 and they took a shared decision to start nintedanib. She had a ? telephone visit with Dr. Kennieth Francois the rheumatologist at [facility] Patient scheduled for her next Rituxan in February 2021 but reviewed the notes indicates this could be pushed to early spring 2021. Dr. Aundria Rud is okay with the patient study got intubated at this point in time.  Patient has follow-up with Dr. Aundria Rud tomorrow at [facility].  At this face-to-face visit she is here with her husband.  She says she is much better after the Covid and also the fall that fractured her ribs.  Nevertheless all this is left her fatigue.  Infective fatigue scores are much worse.  Also in the last 2 weeks has had increased cough wheezing and shortness of breath.  The sputum was also changed color in the last 1 week to clear green.  This is all new.  Her symptom score is therefore a worse than her baseline.  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  She is only taking 100 mg once a day.  The plan was to go up to 100 mg twice a day next week.  This is a minimum effective dose.  The maximum dose is 150 mg twice a day.  Given her age and comorbidities we are starting at a low dose.  Part of this visit is to make sure that she is tolerating the drug fine.  And so far she is.  She is interested in the ILD-pro registry study done by the [facility].   She is working with physical therapy for her fatigue.  Ambulatory Walk 07/20/2019 2 Lap- O2 92% RA; HR 109 No shortness of breath She did not need to stop Used walker    OV 09/20/2019  Subjective:  Patient ID: Stacey [name], female , DOB: Nov 04, 1940 , age 43 y.o. , MRN: [id number] , ADDRESS: [address] [place]  [place] [address]   09/20/2019 -   Chief Complaint  Patient presents with   Televisit    Called and spoke with pt who stated she has been feeling okay since last visit. Pt stated she is still taking OFEV and denies any complaints. Pt states she believes her breathing is stable at this point.    Rheumatoid arthritis with Joint s/ILD- Rituxan/steroids ([facility])  - progressive phenotype (CT 2017/2019 -> Oct 2020). Planned Ofev start in Nov 2020 but did not start as of dec  2020. 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 Admitted 06/11/2019  - 06/16/2019. Rx with Remdesviri and Decadron.  Small Hiatal hernia on CT Oct 2020  Rib fracture from fall Dec 2020: Musculoskeletal: There is nondisplaced fractures of the posterior left fifth, 6, 8, 9, and tenth left ribs. There is unchanged slight superior compression deformities of the T5, T6, T10 vertebral bodies with less than 25% loss in height.Associated pulmnary contusion +   HPI HEYLI BENISH 82 y.o. - has 2nd covid vaccine later this week. Rituxan is end of the month. Baseline RA regiment is On dulera for associated obstructio of lung. Continue ofev 100mg  bid since mid jan 2021 for ILD. No side effects. Did see Buelah Manis for face to face visit early feb 2021 -> for bronchitis and felt better after prdnisone and zpak. Currently on medrol   8 mg day and staying there per Buelah Manis . Wants t oknow if she can taper. Wants to know how she can prevent care flare ups. Explained ofev, masking and social distancing  prevent respiratory infection.Denies choking on food or aspirating. Denies mold in house - relatively news. Denies dog. Denies cat. Denies carious teeth. Denies post nasal drip  OVerll feels better and improved dyspnea.     OV 12/18/2019  Subjective:  Patient ID: Stacey [name], female , DOB: 08/22/1940 , age 66 y.o. , MRN: [id number] , ADDRESS: [address] [place] [address]  PCP [name], MD Rheumatologist-Dr. Rexene Agent at [facility] Pulmonary/ILD: Dr. Jeni Salles  12/18/2019 -   Chief Complaint  Patient presents with   Follow-up    no worse   Rheumatoid arthritis with Joint s/ILD- Rituxan/steroids ([facility])  - progressive phenotype (CT 2017/2019 -> Oct 2020). Planned Ofev start in Nov 2020 but did not start as of dec 2020. 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 Admitted 06/11/2019  - 06/16/2019. Rx with Remdesviri and Decadron.  Small Hiatal hernia on CT Oct 2020  Rib fracture from fall Dec 2020: Musculoskeletal: There is nondisplaced fractures of the posterior left fifth, 6, 8, 9, and tenth left ribs. There is unchanged slight superior compression deformities of the T5, T6, T10 vertebral bodies with less than 25% loss in height.Associated pulmnary contusion +  -On Reclast once a year through Dr. Rexene Agent   HPI Stacey [name] 82 y.o. 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  It has been a few month since I last saw her.  In this time she did not taper her Medrol down.   She stated 8  mg/day.  She was originally taking the Medrol for her rheumatoid arthritis for many years.  She recently increased the dose from 4 mg / 2 mg every other day to 8 mg daily because of recurrent respiratory exacerbations.  She is asking for [facility] refills for obstructive lung disease.  She is getting Rituxan through Dr. Rexene Agent for rheumatoid arthritis.  Her next Rituxan is in September 2021.  She saw Dr. Aundria Rud in April 2021 and reviewed the note.  Her liver function test at the time was fine.  In terms of her ILD she continues on nintedanib 100 mg twice daily.  She says she has no side effects from the drug she is tolerating it quite well.  We discussed about increasing the dose but she wants to hold off because she just got a new supply.  However she is open to increasing the dose when the supply runs out.  She is due for liver function test today.  In terms of overall symptoms things have improved as can be seen below and the symptom score.Marland Kitchen  However her main concern is that of fatigue.  She is open to attending pulmonary rehabilitation.  She is not using oxygen at home.  Sometime back when we tested overnight oxygen desaturation test she did not desaturate.  Walking desaturation test today stable.  She does notice of note that her blood pressure has gone up.  She is working with her primary care physician on this.  She is on losartan.    OV 04/10/2020   Subjective:  Patient ID: Stacey [name], female , DOB: 26-Feb-1941, age 73 y.o. years. , MRN: [id number],  ADDRESS: [address] [place] [address] PCP  [name], MD Providers : Treatment Team:  Attending Provider: Kalman Shan, MD   Chief Complaint  Patient presents with   Follow-up    CT scan 8/23, has not increased Ofev, shortness of breath with exertion.    Rheumatoid arthritis with Joint s/ILD- Rituxan/steroids ([facility] uinviesty)  - progressive phenotype (CT 2017/2019 -> Oct 2020). Planned Ofev  start in Nov 2020 but did not start as of dec 2020. 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 Admitted 06/11/2019  - 06/16/2019. Rx with Remdesviri and Decadron.  Admitted August 2021 for respiratory infection not otherwise specified.  Following trip to [place].  Was hypoxemic.  Small Hiatal hernia on CT Oct 2020  Rib fracture from fall Dec 2020: Musculoskeletal: There is nondisplaced fractures of the posterior left fifth, 6, 8, 9, and tenth left ribs. There is unchanged slight superior compression deformities of the T5, T6, T10 vertebral bodies with less than 25% loss in height.Associated pulmnary contusion +  -On Reclast once a year through Dr. Rexene Agent    HPI Stacey [name] 82 y.o. -returns for follow-up.  Last week by myself early June 2021.  Then after that in early August 2020 when she got admitted for respiratory infection not otherwise specified.  She  was hypoxemic on room air.  This followed a trip to [place].  In the follow-up phase end of August 2020 when she had high-resolution CT chest that showed increased groundglass opacities but when compared to a CT from 8 months ago.  Also chronic ILD had worsened.  I personally visualized the image at this point in time she is improved from this admission she is not using oxygen at all.  In fact walking desaturation test shows near baseline.  Nevertheless overall her symptom scores have declined over time ILD symptom score is listed  below.  She is really concerned about several issues.  In terms of her ILD she was inquiring about escalating to 150 mg twice daily.  Even at 100 mg twice daily she is significantly symptomatic in terms of nonrespiratory issues such as nausea and dizziness and fatigue.  I did caution her that these could all get worse.  Therefore she is just opted to be at 100 mg twice daily  -Recurrent admissions for respiratory issues [I did address that some of this was Covid and fall related and not necessarily her usual summer exacerbation].  Nevertheless, I asked about exposures at home.  She does not have a feather blanket or feather pillow or feather jacket.  She denies any mold or bird exposure or mildew exposure at home.  Does not do any gardening or wind instruments.  She is immunosuppressed and I did notice that she is not on Bactrim for PCP prophylaxis.  In 2019 when we checked G6PD the lab could not process this lab.  She is interested in Bactrim prophylaxis.  She is interested in okay getting G6PD rechecked   -She is dealing with significant amount of dizziness.  Neurology is sending her to neuro rehab.  It is believed it is multifactorial.  I reviewed her recent CT head angio report.  I personally visualized the image   -She is also worried about cough.  Currently cough is tolerable but she says prior to her exacerbations cough gets worse.  We did discuss with her that if she is aspirating which she denied.  We did discuss about the possibility of starting Bactrim and monitoring her cough and she is fine with that.  I did review old records Lungs/Pleura: Biapical pleuroparenchymal scarring. Patchy and largely peripheral peribronchovascular ground-glass, increased from 07/03/2019. Underlying subpleural reticulation, traction bronchiectasis/bronchiolectasis and scattered honeycombing, similar to minimally increased from 07/03/2019. Calcified granulomas. No pleural fluid. Airway is unremarkable. No air  trapping.   Upper Abdomen: Visualized portions of the liver, adrenal glands, kidneys, spleen, pancreas, stomach and bowel are unremarkable with the exception of a small hiatal hernia. Cholecystectomy. Calcified upper abdominal lymph nodes.   Musculoskeletal: Degenerative changes in the spine. No worrisome lytic or sclerotic lesions.   IMPRESSION: 1. Increased patchy pulmonary parenchymal ground-glass may be due to an atypical/viral pneumonia, including due to COVID-19. 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 2. Aortic atherosclerosis (ICD10-I70.0). Coronary artery calcification.     Electronically Signed   By: Leanna Battles M.D.   On: 03/04/2020 12:58    OV 08/01/2020  Subjective:  Patient ID: Stacey [name], female , DOB: [birth year] , age 15 y.o. , MRN: [id number] , ADDRESS: [address] [place] [address] PCP [name], MD Patient Care Team: [name], MD as PCP - General (Internal Medicine) Jodelle Red, MD as PCP -  Cardiology (Cardiology)  This Provider for this visit: Treatment Team:  Attending Provider: Kalman Shan, MD   Rheumatoid arthritis with Joint s/ILD- Rituxan/steroids ([facility])  - progressive phenotype (CT 2017/2019 -> Oct 2020). Planned Ofev start in Nov 2020 but did not start as of dec 2020. 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 Admitted 06/11/2019  - 06/16/2019. Rx with Remdesviri and Decadron.  Admitted August 2021 for respiratory infection not otherwise specified.  Following trip to [place].  Was hypoxemic.  Small Hiatal hernia on CT Oct 2020  Rib fracture from fall Dec 2020: Musculoskeletal: There is nondisplaced fractures of the posterior left fifth, 6, 8, 9, and tenth left ribs. There is unchanged slight superior compression deformities of the T5, T6, T10 vertebral bodies with less than 25% loss in height.Associated pulmnary contusion +  -On Reclast once a year through Dr. Rexene Agent    08/01/2020 -   Chief Complaint  Patient presents with   Follow-up    Doing well     HPI MARGEAUX MASSIE 82 y.o. -returns for follow-up.  She says since last visit she has lost weight.  In fact tracking her weight it appears she is definitely lost weight.  She has occasional intermittent nausea once every few weeks this is because she does not time her nintedanib well with food.  She has a low appetite as well.  She tried to go up on the Internet but she is unable to.  She takes a low-dose 100 mg twice daily.  She is on Rituxan and prednisone.  She is up-to-date with her COVID-vaccine.  Current shortness of breath standpoint she is stable.  Symptoms are stable.  CT Chest data  OV 10/22/2020  Subjective:  Patient ID: Stacey [name], female , DOB: 15-Jul-1940 , age 36 y.o. , MRN: [id number] , ADDRESS: [address] [place] [address] PCP [name], MD Patient Care Team: [name], MD as PCP - General (Internal Medicine) Jodelle Red, MD as PCP - Cardiology (Cardiology)  This Provider for this visit: Treatment Team:  Attending Provider: Kalman Shan, MD    10/22/2020 -   Chief Complaint  Patient presents with   Follow-up    Still having shortness of breath with activity, denies  increase in severity.     Rheumatoid arthritis with Joint s/ILD- Rituxan/steroids ([facility] uinviesty)  - progressive phenotype (CT 2017/2019 -> Oct 2020). Planned Ofev start in Nov 2020 but did not start as of dec 2020. 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 Admitted 06/11/2019  - 06/16/2019. Rx with Remdesviri and Decadron.  - sp evushed x 2  Admitted August 2021 for respiratory infection not otherwise specified.  Following trip to [place].  Was hypoxemic.  Small Hiatal hernia on CT Oct 2020  Rib fracture from fall Dec 2020: Musculoskeletal: There is nondisplaced fractures of the posterior left fifth, 6, 8, 9, and tenth left ribs. There is unchanged slight superior compression deformities of the T5, T6, T10 vertebral bodies with less than 25% loss in height.Associated pulmnary contusion +  -On Reclast once a year through Dr. Rexene Agent  Normal ECHO Summer 2020   HPI LILEIGH PASZEK 82 y.o. -returns for follow-up.  At this point in time for her ILD she is taking nintedanib 100 mg twice daily.  Last liver function test was in December 2021.  She is now attending pulmonary rehabilitation.  She is not sure it is helping her.  She tells me that she does not desaturate at pulmonary rehabilitation so she not using her oxygen.  Review of the records indicate same but it appears that today her dyspnea is worse in terms of symptom score although she is denying that.  Walking desaturation  test in the office today with a forehead probe suggest that pulse ox is declined.  Her pulmonary function test also shows 5% FVC decline.  She was surprised by this.  In talking to her realize that for coexistent obstructive lung disease she is no longer taking Dulera.  She is willing to take inhaler which she will have to twist instead of having to pump with her fingers because she has rheumatoid arthritis.  She is no longer losing weight though.  She had questions about taking monoclonal antibody prophylaxis.  She is under the impression she needs to take it every 6 months with Rituxan dosing.  I did explain to her that in the presence of Rituxan the body is not able to respond actively to vaccine and that is why she is having prophylactic antibody.  She is now had 2 doses the last 1 being a few weeks ago first 1 was in January 2022.  I have written to the [facility] coordinator Lillard Anes 27 monoclonal antibody prophylaxis schedule.  Expressed to patient that because of monoclonal antibody prophylaxis she did not do the vaccine.  Last echocardiogram was in summer 2020.      OV 12/23/2020  Subjective:  Patient ID: Stacey [name], female , DOB: May 26, 1941 , age 53 y.o. , MRN: [id number] , ADDRESS: [address] [place] [address] PCP [name], MD Patient Care Team: [name], MD as PCP - General (Internal Medicine) Jodelle Red, MD as PCP - Cardiology (Cardiology)  This Provider for this visit: Treatment Team:  Attending Provider: Kalman Shan, MD   12/23/2020 -   Chief Complaint  Patient presents with   Follow-up    PFT performed today. Pt states she has been doing okay since last visit and denies any complaints.     HPI TAMARA DELHOYO 82 y.o. -returns for follow-up.  There is concern for progression.  Last visit we have made sure she added Breo.  She says she feels better.  In fact symptom score is better.  However pulmonary function test shows  continued decline.  She is on the low-dose nintedanib.  She says if she does not take the nintedanib exactly with food  and even if she takes it 15 to 20 minutes later she will have nausea.  Today she had nausea but otherwise if she is diligent she can maintain.  I informed her the pulmonary function test shows continued decline.  She is on Rituxan prednisone and low-dose nintedanib.  She initially said she will not be able to tolerate the higher dose nintedanib but after realizing that her nausea is mild and her weight loss is stable she says she is willing to give the higher dose nintedanib and attend.  I was able to get her some donor sample of nintedanib.  The lot number of this is [id number].  Expiration date is November 2023.  The serial number is [id number].  The GT IN number is [id number]       ECHO 11/3120  IMPRESSIONS     1. Left ventricular ejection fraction, by estimation, is 65 to 70%. The  left ventricle has normal function. The left ventricle has no regional  wall motion abnormalities. Left ventricular diastolic parameters are  consistent with Grade I diastolic  dysfunction (impaired relaxation). Elevated left ventricular end-diastolic  pressure. The E/e' is 90. The average left ventricular global longitudinal  strain is -18.4 %.   2. Right ventricular systolic function is normal. The right ventricular  size is normal. There is mildly elevated pulmonary artery systolic  pressure. The estimated right ventricular systolic pressure is 36.9 mmHg.   3. The mitral valve is abnormal. Mild mitral valve regurgitation.   4. The aortic valve is tricuspid. Aortic valve regurgitation is not  visualized. Mild aortic valve sclerosis is present, with no evidence of  aortic valve stenosis.   5. The inferior vena cava is normal in size with greater than 50%  respiratory variability, suggesting right atrial pressure of 3 mmHg.   Comparison(s): Changes from prior study are noted. 12/18/18  EF 60-65%. PA  pressure .    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  OV 03/10/2021  Subjective:  Patient ID: Stacey [name], female , DOB: Oct 17, 1940 , age 89 y.o. , MRN: [id number] , ADDRESS: [address] [place] [address] PCP [name], MD Patient Care Team: [name], MD as PCP - General (Internal Medicine) Jodelle Red, MD as PCP - Cardiology (Cardiology)  This Provider for this visit: Treatment Team:  Attending Provider: Kalman Shan, MD    03/10/2021 -   Chief Complaint  Patient presents with   Follow-up    Pt states she has questions about ofev she currently on it but having side effects.    Rheumatoid arthritis with Joint s/ILD- Rituxan/steroids ([facility])  - progressive phenotype (CT 2017/2019 -> Oct 2020). Planned Ofev start in Nov 2020 but did not start as of dec 2020. 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 Admitted 06/11/2019  - 06/16/2019. Rx with Remdesviri and Decadron.  - sp evushed x 2  - 2nd covid by hx June 2022 - evusheld repeat Oct 2022  Admitted August  2021 for respiratory infection not otherwise specified.  Following trip to [place].  Was hypoxemic.  Small Hiatal hernia on CT Oct 2020  Rib fracture from fall Dec 2020: Musculoskeletal: There is nondisplaced fractures of the posterior left fifth, 6, 8, 9, and tenth left ribs. There is unchanged slight superior compression deformities of the T5, T6, T10 vertebral bodies with less than 25% loss in height.Associated pulmnary contusion +  -On Reclast once a year through Dr. 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  -returns for follow-up.  She presents with her husband Park Breed.  Last visit she wanted to increase the nintedanib to 150 mg twice daily but this is made her have more nausea and vomiting.  The diarrhea is very mild.  She then reduced her nintedanib 200 mg twice daily but still having side effects.  She is now started losing weight again.  She is really frustrated by this.  Her husband asked about pirfenidone.  Explained that the research with progressive pulmonary fibrosis is limited in the case of pirfenidone so nintedanib is the first choice.  Did agree that if nintedanib did not work we would try pirfenidone.  She wanted know what her options were.  We discussed about stopping nintedanib and improving quality of life but face the risk of continued progression pulmonary fibrosis.  She was not that enthused about this choice.  We discussed about trying Zofran with the nintedanib.  She seemed more receptive to this idea.  We decided that we will try with 100 mg twice daily at the lower dose and then see.  She believes asthma is under control.  Her pulmonary function test shown below seems to fluctuate.  Overall symptom severity stable.  She wanted a handicap placard signed today which I did.       OV 04/08/2021  Subjective:  Patient ID: Stacey [name], female , DOB: 07/15/1940 , age 65 y.o. , MRN: [id number] ,  ADDRESS: [address] [place] [address] PCP [name], MD Patient Care Team: [name], MD as PCP - General (Internal Medicine) Jodelle Red, MD as PCP - Cardiology (Cardiology)  This Provider for this visit: Treatment Team:  Attending Provider: Kalman Shan, MD    04/08/2021 -   Chief Complaint  Patient presents with   Follow-up    Pt states she has been doing okay since last visit and denies any real complaints. States breathing has been doing okay and states the nausea went away.    Rheumatoid arthritis with Joint s/ILD- Rituxan/steroids ([facility])  - progressive phenotype (CT 2017/2019 -> Oct 2020). Planned Ofev start in Nov 2020 but did not start as of dec 2020. LFT normal Jan 2021   - Rituxan via [facility] -September 2021 + Medrol 4mg /2mg  QOD baseline many decades (on 8mg  per day since feb 2021 due to recurrent asthma flares)  - nintedanib for her ILD she started it on July 31, 2019 - started 100mg  twice daily (started low dose du eto side effect concern) -> increased to 150mg  bid d June 2022 -> reduced to 100mg  bid aug 2022 due  tow eight loss  - ILD PRO registry study - since 08/16/19  Athma/obstructive lung disease phenotype on Dulera/Singulair  Post herpetic neuralgia - on elavil, gabapentin, effexor  Coronary Artery Calcification - 1 vessel -  S/p - covid-19 on 05/31/2019. Admitted 06/11/2019  - 06/16/2019. Rx with Remdesviri and Decadron.  - sp evushed x 2  Admitted August 2021 for respiratory infection not otherwise specified.  Following trip to [place].  Was hypoxemic.  Small Hiatal hernia on CT Oct 2020  Rib fracture from fall Dec 2020: Musculoskeletal: There is nondisplaced fractures of the posterior left fifth, 6, 8, 9, and tenth left ribs. There is unchanged slight superior compression deformities of the T5, T6, T10 vertebral bodies with less than 25% loss in height.Associated pulmnary contusion +  -On Reclast  once a year through Dr. Rexene Agent  Normal ECHO Summer 2020 -> possible [facility] May 2021 RSVP 30s with GR1 ddx  HPI NEEVE FREIHEIT 82 y.o. 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  Last seen 4 weeks ago.  At that time she was losing weight and having significant GI side effects with nintedanib full dose.  Told her to reduce the nintedanib dose to 100 mg twice daily and also take Zofran as needed.  She is taking Zofran as needed.  She is tolerating the nintedanib low-dose much better.  Symptom scores are listed below.  Overall she feels better.  Weight loss is stabilized.  Infectious gained 1 pound of weight.  She feels that she will just take with nintedanib 100 mg twice daily using Zofran as needed.  She does not want to change the schedule.  I fully agree with her and supported on that.  She is aware that the low-dose is less effective but this might be best balance between risk and benefit.  She did ask about getting a COVID by Valent mRNA booster 2 oh micron and BA.5.  According to history she has had COVID x2.  Most recently was in June 2022 which would be against the current viral strain.  In addition she is got monoclonal antibody prophylaxis euvsheld in past.  She does socially isolate herself to the extent possible and masks.  She is going to get the monoclonal antibody prophylaxis again in a few weeks when she gets her Rituxan.  We did discuss the fact that because she is on Rituxan she is immunosuppressed and does not make antibodies and then the facet antibody transfer that she gets through [facility] is risk protective.  Additional risk protective feature is the fact that she had a current strain of COVID-19 and potentially has natural immunity.  Therefore it is her call to get the bivaet booster although she wanted to avoid it and I supported this in a shared decision making she could hold off till at least January 2023 when it  will be 6 months since her natural infection.     OV 07/18/2021  Subjective:  Patient ID: Stacey [name], female , DOB: 12-07-1940 , age 82 y.o. , MRN: [id number] , ADDRESS: [address] [place] [address] PCP [name], MD Patient Care Team: [name], MD as PCP - General (Internal Medicine) Jodelle Red, MD as PCP - Cardiology (Cardiology)  This Provider for this visit: Treatment Team:  Attending Provider: Kalman Shan, MD    07/18/2021 -   Chief Complaint  Patient presents with   Follow-up    Pt states she has been doing  okay since last visit. States her breathing is about the same.    Rheumatoid arthritis with Joint s/ILD- Rituxan/steroids ([facility])  - progressive phenotype (CT 2017/2019 -> Oct 2020). Planned Ofev start in Nov 2020 but did not start as of dec 2020. 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 Admitted 06/11/2019  - 06/16/2019. Rx with Remdesviri and Decadron.  - sp evushed x 2  Admitted August 2021 for respiratory infection not otherwise specified.  Following trip to [place].  Was hypoxemic.  Small Hiatal hernia on CT Oct 2020  Rib fracture from fall Dec 2020: Musculoskeletal: There is nondisplaced fractures of the posterior left fifth, 6, 8, 9, and tenth left ribs. There is unchanged slight superior compression deformities of the  T5, T6, T10 vertebral bodies with less than 25% loss in height.Associated pulmnary contusion +  -On Reclast once a year through Dr. Rexene Agent  Normal ECHO Summer 2020 -> possible [facility] May 2021 RSVP 30s with GR1 ddx   xxxx HPI ALISANDRA KROLIKOWSKI 82 y.o. -presents for follow-up.  Last visit September 2022.  Since then she is doing stable.  With a lower dose of nintedanib no more diarrhea.  Weight loss is stopped.  Barely any nausea.  She is not needing Zofran for nausea control.  Her main concern right now is that she is having imbalance issues.  She is stopped working with the physical therapy.  She thinks it is 1 of proprioception but also long-term muscle loss and sarcopenia.  I have advised her to take this with her primary care/rheumatology.  Terms of asthma is stable on Breo and Singulair.  Last liver function test April 2022 in our chart.  Latest pulmonary function test shows stability    CT Chest data  No results found.            OV 03/06/2022  Subjective:  Patient ID: Stacey [name], female , DOB: 1940/11/02 , age 40 y.o. , MRN: [id number] , ADDRESS: [address] [place] [address] PCP [name], MD Patient Care Team: [name], MD as PCP - General (Internal Medicine) Jodelle Red, MD as PCP - Cardiology (Cardiology)  This Provider for this visit: Treatment Team:  Attending Provider: Kalman Shan, MD    03/06/2022 -   Chief Complaint  Patient presents with   Follow-up    PFT performed today.  Pt states she has been doing okay since last visit. States her breathing has been doing okay but states she has been having some problems with balance.    HPI RASHETTA PLAYFORD 82 y.o. -returns for follow-up.  Last seen in January 2023.  She says that from a breathing standpoint she is stable but she has had balance issues.  She has had 2 accidental falls.  The first 1 was in March 2023 while entering the house along  the steps and she  landed in the bushes and had 25 stitches to her left lower extremity.  She also had collarbone fracture.  Then again a fall in the driveway 2 months ago but with just bruising.  She is now going to undergo physical therapy.  ENT evaluation did not show any ENT causes for the fall.  In terms of her dyspnea and walking desaturation test in the office she is stable but her pulmonary function test shows continued decline.  She is on nintedanib at the lower dose because she is not able to tolerate the higher dose.  On this lower dose she still requiring Zofran for control of nausea.  She takes Zofran preemptively.  There are no other issues.  The last echo and CT scan was over a year ago.     OV 12/11/2022  Subjective:  Patient ID: Stacey [name], female , DOB: June 07, 1941 , age 78 y.o. , MRN: [id number] , ADDRESS: [place] [address] [place] [address] PCP Eloisa Northern, MD Patient Care Team: Eloisa Northern, MD as PCP - General (Internal Medicine) Jodelle Red, MD as PCP - Cardiology (Cardiology)  This Provider for this visit: Treatment Team:  Attending Provider: Kalman Shan, MD   Rheumatoid arthritis with Joint s/ILD- Rituxan/steroids [facility])  - progressive phenotype (CT 2017/2019 -> Oct 2020). Planned Ofev start in Nov 2020 but did not start as of dec 2020. LFT normal Jan 2021   - Rituxan via [facility] -September 2021 + Medrol 4mg /2mg  QOD baseline many decades (on 8mg  per day since feb 2021 due to recurrent asthma flares)  - nintedanib for her ILD she started it on July 31, 2019 - started 100mg  twice daily (started low dose du eto side effect concern) -> increased to 150mg  bid d June 2022 -> reduced to 100mg  bid aug 2022 due tow eight loss  - ILD PRO registry study - since 08/16/19  Athma/obstructive lung disease phenotype on Dulera/Singulair  Post herpetic neuralgia - on elavil, gabapentin, effexor  Coronary Artery Calcification - 1 vessel  -  S/p - covid-19 on 05/31/2019. Admitted 06/11/2019  - 06/16/2019. Rx with Remdesviri and Decadron.  - sp evushed x 2  Admitted August 2021 for respiratory infection not otherwise specified.  Following trip to [place].  Was hypoxemic.  Small Hiatal hernia on CT Oct 2020  Rib fracture from fall Dec 2020: Musculoskeletal: There is nondisplaced fractures of the posterior left fifth, 6, 8, 9, and tenth left ribs. There is unchanged slight superior compression deformities of the T5, T6, T10 vertebral bodies with less than 25% loss in height.Associated pulmnary contusion +  -On Reclast once a year through Dr. Rexene Agent  Normal ECHO Summer 2020 -> possible [facility] May 2021 RSVP 30s with GR1 ddx  -  Normal ECHO may 2022     12/11/2022 -   Chief Complaint  Patient presents with   Follow-up    F/up, in the hosp. 3 weeks ago, and wants to know why she was in the hosp.     HPI SHADY CARVALLO 82 y.o. -was last seen in August 2023.  After that her husband has passed away 09-30-2022 following motor vehicle accident when he was crossing the road.  .  Then she was admitted for 3 days 11/17/2022 through 11/20/2022 with nausea and vomiting.  She was also diagnosed with pneumonia.  The hospitalist contacted me.  Out of caution advised him to hold off giving any  nintedanib she did receive IV community-acquired pneumonia therapy.  Review of the labs indicate she did not suffer from acute kidney injury.  Her discharge sodium was slightly low at 134.  Blood cultures negative.She had CT abdomen and pelvis 11/11/2022 that did not show any acute process in the abdomen. She was discharged to SNF.  She tells me that at baseline she was taking Zofran once daily with some mild nintedanib induced nausea but the onset of nausea for this admission was quite abrupt and severe.  She does not know the cause.  After discharge from the SNF 2 weeks ago and now for the last 10 days she is back taking nintedanib.  She is  having some mild drug-induced nausea and she says she is able to combat this with taking Zofran twice daily.  At this point in time she prefers to take Zofran along with the nintedanib and see what happens.   For a respiratory standpoint she is frustrated she not been able to get a pulmonary function test since August 2023.  Her ill health and also PFT machine not working have delayed this.  Subjectively she is feeling the same and clinically she feels stable from an ILD standpoint.  Therefore I suspect it is stable.   SYMPTOM SCALE - ILD 03/27/2019  05/24/2019 (2-3 weeks pre cpovid)  08/03/2019 Post covid Post fall rib # 12/18/2019  04/10/2020 142# resp virus admit aug 2021 nos 08/01/2020 134#  10/22/2020 136# Now doing rehab 12/23/2020 136# 03/10/2021 131# Ofev 150mg  bid 04/08/2021 132# Ofev 100mg  bid 07/18/2021 133# ofev 100 x 2 03/06/2022 132#, ofev 100x2 12/11/2022 128#  O2 use ra       ra ra ra ra ra ra  Shortness of Breath 0 -> 5 scale with 5 being worst (score 6 If unable to do)              At rest 0 2 3 2 3 0 3 0 3 1  2 1   Simple tasks - showers, clothes change, eating, shaving 0 3 2 3 4 1 3 2 3 1  3 3   Household (dishes, doing bed, laundry) 2 4 5 3 4 3 3 3 4 3 3 4    Shopping 2 3 6 4 4 4 4 3 4 4 4 4    Walking level at own pace 2 3 4 4 3 2 4 2 3 3 3 4    Walking up Stairs 3 3 5 4 4 4 4 3 4 4 4 5    Total (40 - 48) Dyspnea Score 9 17 25 20 22 14 21 13 21 16 19 21    How bad is your cough? 2 2 3 1 2  0 0 0 1 0 0 0   How bad is your fatigue 3 3.5 5 3.5 3 4 3 2 3 3 4 4    nause    0 1 1 0 1 2.5 1 1 1    vomit    0 1 1 0 1 2.4 1 1 1    diarrhea    0 1 0 0 0 1 0 0 0   anxiety    0 2 0 1 0 1 0 0 1   depression    0 1 0 0 0 0 0 0 0      Simple office walk  feet x  3 laps goal with forehead probe 04/12/2018  07/21/2018  05/24/2019  12/18/2019  04/10/2020  08/01/2020  10/22/2020  03/10/2021  07/18/2021  03/06/2022  12/11/2022   O2 used Room air - off o2 x 10 min Room air  Room air ra ra ra ra  ra ra ra ra  Number laps completed 3 x 185 feet 3 x 250 feet    r    3 1 lap  Comments about pace normal normal   Slow, no walker Slow . No walker.  Slow. No walker. No cane walker   waler  Resting Pulse Ox/HR 99% and 72/min 97% and 74/min 97% and 81/min 97% and 76/m 96% and 88/min 94% and 83 96% and HR 79 97% and HR 51 100% and 78 99% and HR 73 97% and HR 76  Final Pulse Ox/HR 98% and 95/min 96% and 96/min 94% and 107/min 93% and 94 92% and 97 90% and 100 89% and 104/min 92% and 106 97% and 102 92% and HR 105 94% ad HR 80  Desaturated </= 88% no no no no no yes yes no no no   Desaturated <= 3% points no no Yes, 3 pints Ues, 4 points Yes,  4 points Yes, 4 points Yes, 7 points  Yes, 3 pints Yes, 7 poins   Got Tachycardic >/= 90/min yes yes yes yes yes yes yes   yes   Symptoms at end of test No complaint Mild dyspnea Mod dyspnea Some dyspnea dyspnea dyspnie cat 3d Dyspneic moderate  Dyspneic and wheezing Mod dyspnea Mopd dyspea   Miscellaneous comments improed from hospital Same v improved woprse same  Wobbly gait baseline Needed 2 break  Stopped x 2 to get balanced and staggered while alking      PFT     Latest Ref Rng & Units 03/06/2022    3:01 PM 07/02/2021    3:07 PM 03/07/2021   11:05 AM 12/23/2020    1:03 PM 10/22/2020    9:05 AM 09/10/2020    9:52 AM 04/08/2020    9:27 AM  PFT Results  FVC-Pre L 1.62  1.69  1.78  1.66  1.73  1.76  1.83   FVC-Predicted Pre % 61  62  66  60  63  64  66   Pre FEV1/FVC % % 74  73  72  71  76  75  74   FEV1-Pre L 1.20  1.23  1.28  1.17  1.30  1.32  1.35   FEV1-Predicted Pre % 61  61  64  57  64  64  66   DLCO uncorrected ml/min/mmHg 12.57  15.35  12.94  14.98  13.67  15.38  14.39   DLCO UNC% % 65  79  66  77  70  79  74   DLCO corrected ml/min/mmHg 12.57  15.35  12.94  14.98  13.80  15.38  15.49   DLCO COR %Predicted % 65  79  66  77  71  79  79   DLVA Predicted % 84  100  84  86  80  102  108        has a past medical history of Abnormal finding of  blood chemistry, Asthma, H/O measles, H/O varicella, Hypertension, 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HCC), 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   reports that she has never smoked. She has never used smokeless tobacco.  Past Surgical History:  Procedure Laterality Date   CHOLECYSTECTOMY  2011   IR ANGIO INTRA EXTRACRAN SEL  COM CAROTID INNOMINATE BILAT MOD SED  06/18/2020   IR ANGIO VERTEBRAL SEL SUBCLAVIAN INNOMINATE UNI R MOD SED  06/18/2020   IR ANGIO VERTEBRAL SEL VERTEBRAL UNI L MOD SED  06/18/2020   WISDOM TOOTH EXTRACTION      Allergies  Allergen Reactions   Penicillins Other (See Comments)   Remicade [Infliximab] Other (See Comments)    Reaction unknown    Immunization History  Administered Date(s) Administered   Fluad Quad(high Dose 65+) 04/23/2020, 03/23/2022   Influenza Split 04/12/2013, 04/12/2014, 03/14/2015, 04/23/2016   Influenza, High Dose Seasonal PF 04/12/2017, 03/24/2018, 04/13/2019, 03/27/2021   Influenza-Unspecified 04/03/2013, 04/22/2017   Moderna Sars-Covid-2 Vaccination 08/25/2019, 09/22/2019, 03/07/2020   Pfizer Covid-19 Vaccine Bivalent Booster 38yrs & up 05/16/2021   Pneumococcal Conjugate-13 04/03/2013, 09/28/2014   Pneumococcal Polysaccharide-23 07/13/2012, 07/24/2013   Respiratory Syncytial Virus Vaccine,Recomb Aduvanted(Arexvy) 04/12/2022   Td 01/11/2018   Tdap 12/10/2017    Family History  Problem Relation Age of Onset   Asthma Mother    Anemia Mother    Polymyalgia rheumatica Mother    COPD Father    Pulmonary fibrosis Father    Breast cancer Maternal Grandmother 66     Current Outpatient Medications:    amitriptyline (ELAVIL) 25 MG tablet, Take 25 mg by mouth at bedtime., Disp: , Rfl: 0   Biotin 1000 MCG tablet, Take 1,000 mcg by mouth daily., Disp: , Rfl:    BREO ELLIPTA  100-25 MCG/ACT AEPB, Inhale 1 puff into the lungs daily., Disp: 60 each, Rfl: 5   cholecalciferol (VITAMIN D) 1000 UNITS tablet, Take 1,000 Units by mouth daily., Disp: , Rfl:    gabapentin (NEURONTIN) 300 MG capsule, Take 600 mg by mouth at bedtime., Disp: , Rfl:    losartan (COZAAR) 25 MG tablet, Take 25 mg by mouth daily., Disp: , Rfl:    methylPREDNISolone (MEDROL) 4 MG tablet, Take 1 tablet (4 mg total) by mouth every other day. Alternate with the 2mg  dose (Patient taking differently: Take 4 mg by mouth daily.), Disp:  , Rfl:    Nintedanib (OFEV) 100 MG CAPS, Take 1 capsule (100 mg total) by mouth 2 (two) times daily., Disp: 60 capsule, Rfl: 10   ondansetron (ZOFRAN) 4 MG tablet, Take 1 tablet (4 mg total) by mouth every 8 (eight) hours as needed for nausea or vomiting., Disp: 30 tablet, Rfl: 5   pantoprazole (PROTONIX) 40 MG tablet, Take 1 tablet (40 mg total) by mouth 2 (two) times daily before a meal., Disp: 180 tablet, Rfl: 3   pravastatin (PRAVACHOL) 20 MG tablet, Take 1 tablet (20 mg total) by mouth daily., Disp: 90 tablet, Rfl: 0   venlafaxine XR (EFFEXOR-XR) 75 MG 24 hr capsule, Take 75 mg by mouth at bedtime. , Disp: , Rfl:       Objective:   Vitals:   12/11/22 1553  BP: (!) 140/70  Pulse: 72  SpO2: 95%  Weight: 128 lb 3.2 oz (58.2 kg)  Height: 5\' 5"  (1.651 m)    Estimated body mass index is 21.33 kg/m as calculated from the following:   Height as of this encounter: 5\' 5"  (1.651 m).   Weight as of this encounter: 128 lb 3.2 oz (58.2 kg).  @WEIGHTCHANGE @  [employer]   12/11/22 1553  Weight: 128 lb 3.2 oz (58.2 kg)     Physical Exam   General: No distress. frail O2 at rest: no Cane present: no Sitting in wheel chair: no. HAS WALKER + Frail: nYES Obese: no  Neuro: Alert and Oriented x 3. GCS 15. Speech normal Psych: Pleasant Resp:  Barrel Chest - no.  Wheeze - no, Crackles - MILD YES, No overt respiratory distress CVS: Normal heart sounds. Murmurs -  no Ext: Stigmata of Connective Tissue Disease - RA HEENT: Normal upper airway. PEERL +. No post nasal drip        Assessment:       ICD-10-CM   1. ILD (interstitial lung disease) (HCC)  J84.9     2. Drug-induced weight loss  R63.4    T50.905A     3. Drug-induced nausea and vomiting  R11.2    T50.905A     4. Encounter for long-term current use of high risk medication  Z79.899          Plan:     Patient Instructions     ICD-10-CM   1. ILD (interstitial lung disease) (HCC)  J84.9     2. Drug-induced weight loss  R63.4    T50.905A     3. Drug-induced nausea and vomiting  R11.2    T50.905A     4. 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  Other possible etiologies would be undiagnosed urinary tract infection or food poisoning.  Seems you are tolerating Ofev rechallenge now with taking Zofran twice daily on a scheduled basis   Plan - continue ofev 100mg  twice daily - can take zofran 4mg  twice daily  - -Continue Rituxan and steroids through the rheumatologist - continuie  breo 100 dosing daily once with albuterol as needed -Check chemistry blood work and liver function test today     Asthma, unspecified asthma severity, unspecified whether complicated, unspecified whether persistent  - stable  Plan  -continue breo scheduled with albuteraol as neede  Grief reaction  - sorry to hear about loss of your husband on September 11, 2022    Followup  - spiromerty and dlco in 2  months  - 30 min  visit in 2 months but after PFT;  - symptoms score and walking desaturation test at followup    SIGNATURE    Dr. Kalman Shan, M.D., F.C.C.P,  Pulmonary and Critical Care Medicine Staff Physician, [facility] Director - Interstitial Lung Disease  Program  Pulmonary Fibrosis [facility] at [facility] [place], [place], [address]  Pager: [phone], If no answer or between  15:00h - 7:00h: call [phone] Telephone: [phone]  4:17 PM 12/11/2022

## 2022-12-11 NOTE — Patient Instructions (Addendum)
ICD-10-CM   1. ILD (interstitial lung disease) (HCC)  J84.9     2. Drug-induced weight loss  R63.4    T50.905A     3. Drug-induced nausea and vomiting  R11.2    T50.905A     4. Encounter for long-term current use of high risk medication  Z79.899        Interstitial lung disease due to connective tissue disease (HCC) Encounter for long-term current use of high risk medication  -Quite possible that nausea and vomiting related admission was at least in part due to [facility].  Other possible etiologies would be undiagnosed urinary tract infection or food poisoning.  Seems you are tolerating Ofev rechallenge now with taking Zofran twice daily on a scheduled basis   Plan - continue ofev 100mg  twice daily - can take zofran 4mg  twice daily  - -Continue Rituxan and steroids through the rheumatologist - continuie  breo 100 dosing daily once with albuterol as needed -Check chemistry blood work and liver function test today     Asthma, unspecified asthma severity, unspecified whether complicated, unspecified whether persistent  - stable  Plan  -continue breo scheduled with albuteraol as neede  Grief reaction  - sorry to hear about loss of your husband on September 11, 2022    Followup  - spiromerty and dlco in 2  months  - 30 min  visit in 2 months but after PFT;  - symptoms score and walking desaturation test at followup

## 2022-12-12 LAB — BASIC METABOLIC PANEL
BUN/Creatinine Ratio: 21 (ref 12–28)
BUN: 15 mg/dL (ref 8–27)
CO2: 27 mmol/L (ref 20–29)
Calcium: 9.2 mg/dL (ref 8.7–10.3)
Chloride: 100 mmol/L (ref 96–106)
Creatinine, Ser: 0.72 mg/dL (ref 0.57–1.00)
Glucose: 85 mg/dL (ref 70–99)
Potassium: 5.1 mmol/L (ref 3.5–5.2)
Sodium: 139 mmol/L (ref 134–144)
eGFR: 83 mL/min/{1.73_m2} (ref >59)

## 2022-12-12 LAB — HEPATIC FUNCTION PANEL
ALT: 11 IU/L (ref 0–32)
AST: 19 IU/L (ref 0–40)
Albumin: 3.9 g/dL (ref 3.7–4.7)
Alkaline Phosphatase: 76 IU/L (ref 44–121)
Bilirubin Total: <0.2 mg/dL (ref 0.0–1.2)
Bilirubin, Direct: <0.1 mg/dL (ref 0.00–0.40)
Total Protein: 5.8 g/dL — ABNORMAL LOW (ref 6.0–8.5)

## 2022-12-22 ENCOUNTER — Encounter (HOSPITAL_BASED_OUTPATIENT_CLINIC_OR_DEPARTMENT_OTHER): Payer: Medicare Other

## 2022-12-22 DIAGNOSIS — M0579 Rheumatoid arthritis with rheumatoid factor of multiple sites without organ or systems involvement: Secondary | ICD-10-CM | POA: Diagnosis not present

## 2023-01-07 ENCOUNTER — Ambulatory Visit (HOSPITAL_BASED_OUTPATIENT_CLINIC_OR_DEPARTMENT_OTHER): Payer: Medicare Other | Admitting: Cardiology

## 2023-01-12 ENCOUNTER — Encounter (HOSPITAL_BASED_OUTPATIENT_CLINIC_OR_DEPARTMENT_OTHER): Payer: Self-pay

## 2023-01-12 ENCOUNTER — Telehealth (HOSPITAL_BASED_OUTPATIENT_CLINIC_OR_DEPARTMENT_OTHER): Payer: Self-pay | Admitting: Cardiology

## 2023-01-12 NOTE — Telephone Encounter (Signed)
Called to speak with patient regarding the rescheduling of her 03/05/23 10:20 am appointment with Dr. Cristal Deer (provider not in)  New appointment is Friday 03/12/23 at 10:20 am.  Will also send My Chart message

## 2023-01-18 DIAGNOSIS — Z7952 Long term (current) use of systemic steroids: Secondary | ICD-10-CM | POA: Diagnosis not present

## 2023-01-18 DIAGNOSIS — Z7960 Long term (current) use of unspecified immunomodulators and immunosuppressants: Secondary | ICD-10-CM | POA: Diagnosis not present

## 2023-01-18 DIAGNOSIS — M0579 Rheumatoid arthritis with rheumatoid factor of multiple sites without organ or systems involvement: Secondary | ICD-10-CM | POA: Diagnosis not present

## 2023-02-10 ENCOUNTER — Other Ambulatory Visit (HOSPITAL_BASED_OUTPATIENT_CLINIC_OR_DEPARTMENT_OTHER): Payer: Self-pay | Admitting: Cardiology

## 2023-02-10 DIAGNOSIS — E7800 Pure hypercholesterolemia, unspecified: Secondary | ICD-10-CM

## 2023-02-10 NOTE — Telephone Encounter (Signed)
Rx request sent to pharmacy.  

## 2023-02-24 DIAGNOSIS — L603 Nail dystrophy: Secondary | ICD-10-CM | POA: Diagnosis not present

## 2023-02-24 DIAGNOSIS — M6740 Ganglion, unspecified site: Secondary | ICD-10-CM | POA: Diagnosis not present

## 2023-02-24 DIAGNOSIS — D225 Melanocytic nevi of trunk: Secondary | ICD-10-CM | POA: Diagnosis not present

## 2023-02-24 DIAGNOSIS — L821 Other seborrheic keratosis: Secondary | ICD-10-CM | POA: Diagnosis not present

## 2023-02-24 DIAGNOSIS — Z85828 Personal history of other malignant neoplasm of skin: Secondary | ICD-10-CM | POA: Diagnosis not present

## 2023-03-01 ENCOUNTER — Other Ambulatory Visit: Payer: Self-pay

## 2023-03-01 DIAGNOSIS — J45909 Unspecified asthma, uncomplicated: Secondary | ICD-10-CM

## 2023-03-01 DIAGNOSIS — J849 Interstitial pulmonary disease, unspecified: Secondary | ICD-10-CM

## 2023-03-01 DIAGNOSIS — M359 Systemic involvement of connective tissue, unspecified: Secondary | ICD-10-CM

## 2023-03-02 ENCOUNTER — Ambulatory Visit (INDEPENDENT_AMBULATORY_CARE_PROVIDER_SITE_OTHER): Payer: Medicare Other | Admitting: Internal Medicine

## 2023-03-02 ENCOUNTER — Encounter: Payer: Self-pay | Admitting: Internal Medicine

## 2023-03-02 ENCOUNTER — Encounter: Payer: Medicare Other | Admitting: Internal Medicine

## 2023-03-02 VITALS — BP 120/80 | HR 68 | Ht 65.0 in | Wt 134.2 lb

## 2023-03-02 DIAGNOSIS — M359 Systemic involvement of connective tissue, unspecified: Secondary | ICD-10-CM

## 2023-03-02 DIAGNOSIS — J849 Interstitial pulmonary disease, unspecified: Secondary | ICD-10-CM

## 2023-03-02 DIAGNOSIS — Z006 Encounter for examination for normal comparison and control in clinical research program: Secondary | ICD-10-CM

## 2023-03-02 DIAGNOSIS — Z79899 Other long term (current) drug therapy: Secondary | ICD-10-CM

## 2023-03-02 DIAGNOSIS — J45909 Unspecified asthma, uncomplicated: Secondary | ICD-10-CM

## 2023-03-02 LAB — PULMONARY FUNCTION TEST
DL/VA % pred: 78 %
DL/VA: 3.16 ml/min/mmHg/L
DLCO cor % pred: 59 %
DLCO cor: 11.53 ml/min/mmHg
DLCO unc % pred: 59 %
DLCO unc: 11.53 ml/min/mmHg
FEF 25-75 Pre: 1.15 L/s
FEF2575-%Pred-Pre: 87 %
FEV1-%Pred-Pre: 71 %
FEV1-Pre: 1.39 L
FEV1FVC-%Pred-Pre: 104 %
FEV6-%Pred-Pre: 73 %
FEV6-Pre: 1.81 L
FEV6FVC-%Pred-Pre: 105 %
FVC-%Pred-Pre: 69 %
FVC-Pre: 1.81 L
Pre FEV1/FVC ratio: 76 %
Pre FEV6/FVC Ratio: 100 %

## 2023-03-02 NOTE — Progress Notes (Signed)
Brief patient profile:  46 yowf  never smoker with allergies/inhalers as child outgrew by [employer] then  RA since around 2000  Prednisone  x decades and prev eval by Dr Delford Field around 2004 for sob resolved s maint rx and referred 05/26/2013 by Dr Renne Crigler for bronchitis and abn cxr   History of Present Illness  05/26/2013 1st [facility] office visit/ Wert cc June 2014 dx pna  In [place] and remicade stopped and 100% better and placed arencia in September 2014  then abruptly worse first week in November with cough green sputum s nasal symptoms, fever low grade and no cp or cough and completely recovered prior to OV does not recall abx but issue is why keeps getting sick and abn CT Chest (see below).   Arthritis symptoms well controlled at present on Rx for RA rec Nexium 40 mg Take 30-60 min before first meal of the day and add pepcid 20 mg one at bedtime whenever coughing.     10/19/2017  f/u ov/Wert re:  RA lung dz  Chief Complaint  Patient presents with   Follow-up    Cough is much improved, but has not resolved yet. Cough is non prod. She has not had to use her neb.   Dyspnea:  Not limited by breathing from desired activities  But some doe x steps Cough: daytime > noct dry  Sleep: fine  SABA use:  No saba Medrol 4 mg a/w 2mg  per day/ ok control of arthritis  rec Start back on gabapentin up to 300 mg each am  in addition to the the two at bedtime  If not better increase the medrol to 8 mg daily until bettter then taper back to where you      01/10/2018  f/u ov/Wert re:  RA  Lung dz Medrol  4 mg  One alternating with a half Chief Complaint  Patient presents with   Follow-up    PFT's done. Her breathing has been gradually worsening since the last visit. She has occ cough- non prod.   Dyspnea gradually worse since last ov:  MMRC1 =  MMRC3 = can't walk 100 yards even at a slow pace at a flat grade s stopping due to sob    Gradually x 3 m / more fatigue / no change in  arthritis  Cough: not an issue rec Protonix 40 mg Take 30-60 min before first meal of the day  GERD diet   01/17/2018 acute extended ov/Wert re: cough on medrol 4 mg  One a/w one half  Chief Complaint  Patient presents with   Acute Visit    started coughing 01/11/18- occ prod with minimal green sputum.  She states also wheezing and having increased SOB.    abruptly worse 01/11/18 with severe 24/7 coughing >>  prod min green mucus esp in am/ assoc with subjective wheeze and did not follow previous contingencies re flutter / saba/ increase medrol and admits she does not rember those written instructions nor how to use the neb provided .  No fever/ comfortable at rest sitting  rec For cough > mucinex dm 1200 mg every 12 hours and cough into the flutter valve as much as possible  Doxycycline 100 mg twice daily x 10 days with glass of water Medrol 4mg  x 2 now and take 2  daily until cough is better then 1 daily x 5 days and then resume the previous dose  Shortness of breath/ wheezing/ still coughing >  albuterol neb every 4 hours as needed      01/20/2018 acute extended ov/Wert re: refractory cough and sob 01/11/18 Chief Complaint  Patient presents with   Acute Visit    she is not feeling better, coughing , very SOB, wheezing  mucus now clear/scant  on doxy/ neb machine not working (tube would not plug into the side s adequate force and she was not capable of applying it due to RA hands. Cough/ wheeze/ sob 24/7 / flutter not helping/ can't lie down at hs   rec While coughing protonix 40 mg Take 30- 60 min before your first and last meals of the day  Shortness of breath/ wheezing/ still coughing > albuterol neb every 4 hours as needed  Depomedrol 120 mg IM and medrol 32 mg daily x 2 days,  then 16 mg x 3 days,  Then 8 mg x 4 days , then resume the 4 mg daily  For severe cough > tylenol 3# one every 4 hours if needed  Go to ER if condition worsens on above plan       Date of admission: 01/22/2018              Date of discharge: 01/27/2018   History of present illness: As per the H and P dictated on admission, " Stacey [name]  is a 82 y.o. female, w Rheumatoid arthritis, ILD Asthma, apparently c/o increase in dyspnea this evening. Dry cough.   Denies fever, chills, cp, palp,  N/v, diarrhea, brbpr, black stool.   Pt notes recently being given steroid injection in office as well as being placed on doxycycline. This might have helped slightly but pt worse  Hospital Course:  Summary of her active problems in the hospital is as following. 1 dyspnea/hypoxemia/ILD Concerned that likely GERD Is causing ILD. Patient with cough.   Patient with complaints of awakening with cough and also with oral intake which is slightly improved since 01/24/2018. assessed by speech therapy and speech therapy raising concern of esophageal component but no signs of aspiration.   2D echo with a EF of 55 to 60% with no wall motion abnormalities, grade 1 diastolic dysfunction.  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 Pulmonary felt that the patient should be on scheduled Reglan. I have placed the patient on scheduled potassium before sleep. Continue steroids on discharge continue Mucinex and Claritin as well as inhalers. Patient will follow-up with pulmonary outpatient   2.  Gastroesophageal reflux disease Continue PPI and H2 blocker.  I changed PPI to [facility].   3.  Rheumatoid arthritis Outpatient follow-up.     4.  Anxiety Continue Effexor.       All other chronic medical condition were stable during the hospitalization.  Patient was ambulatory without any assistance. On the day of the discharge the patient's vitals were stable , and no other acute medical condition were reported by patient. the patient was felt safe to be discharge at home with family.   Consultants: PCCM  Procedures: Echocardiogram       03/21/2018  f/u ov/Wert re:   S/p admit was transiently  better  and downhill since Labor day on medrol 4 mg daily  Last orencia on Sept 4th 2019  Chief Complaint  Patient presents with   Acute Visit    Per patient, she has had a dry cough since July 2019. She has been wheezing as well. Increased fatigue. Body aches. Denies any fever or chills.   Dyspnea:  MMRC4  = sob if tries to leave home or while getting dressed   Cough: harsh/ hacking mostly dry/ has flutter not using    SABA use: not much better with rx   No obvious day to day or daytime variability or assoc excess/ purulent sputum or mucus plugs or hemoptysis or cp or chest tightness, subjective wheeze or overt sinus or hb symptoms.     Also denies any obvious fluctuation of symptoms with weather or environmental changes or other aggravating or alleviating factors except as outlined above   No unusual exposure hx or h/o childhood pna/ asthma or knowledge of premature birth.   INpatient consult 03/26/18 82 year old with rheumatoid arthoritis.At baseline the patient lives at home with her husband and is independent of ADLs.  Has been on many immune suppressants over decades and curently on orencia x 4 year and prednisone. Does not recollect being on bactrim/dapsone. Chart mentions BOOP/MAI in 2001 but she denies this. 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 Got admitted. Rx with steroids. Per Notes - clnical suspicion of  arytenoid inflmmation related wheeze noticed (she also reports asthma NOS). Follolwup with ENT recommended (but not seen one as yet). She also appears to have passed swallow with rec for regular diet with thin liquids but did to have mild eso stricture and reflux during testing . PFT shows 10% FVC decline for first ime. CT chest at this tme (aug 2019) showed new rLL infiltrate.  ECHO July 2019 without evicence of elevated  PASP and saw cards [facility] June 2019 and was considered to have worsenin dyspnea due to [employer] issues (reports stress test at [facility] that was normal but I cannot see it)   She reports after discharge she got better but in last several weeks has deteriorated with cough and dyspnea. There is new hypoxemia (currently RA with nail polish and poor circulation  - 89% pulse ox) needing 2L [place]. Per [facility] improved with steroids and abx. CTA 03/24/2018 => shows that RLL inifltrate has improved . Other chronic ILD changes + and small  Hiatal hernia + witthout change.   Review of lab work does show eosinophilia at time of admision   EVENTS 03/21/18 - IgE -5, blood allegy panel - negative, 03/23/2018 - - admit . HIGH EOS 2300, ESR 48, BNP 89 , HIV neg 9/12- PCT negative, RVP negative 9/14 -  PCT < 0.1, Urine strep - negative, MRSA PCR - positive. IgE - normal 4, Blood allergy panel repeat - negative 03/26/2018 - leading consideration for airway (BO in RA +/- asthma) related flare either due to MRSA bronchitis or clinical suspicion of arytenoid inflmmation +/- GERD relatd flare (she has small hiatal hernia)  +/- ? Dysphagia  up causing mild hypoxemia acute resp failure, wheeze . Allergy and IgE blood work negative thought. Patient reports being better but says she is choking on drinkin water [facility] MD says wheeze improved significantly with steroids.  RN says was down to RA yesterday evening but needed 1L [place] at sleep. Today -Room air at rest 94% and desaturated to 86% walking 60 feet 03/27/2018  - better. Off o2 at rest. STill coughs with water and when lies down.  Husband at bedside. Both requesting ILD clinic followup . Desaturated t 79% walking 90 feet.   OV 04/12/2018  Subjective:  Patient ID: Stacey [name], female , DOB: [birth year] , age 78 y.o. , MRN: [id number] , ADDRESS: [address] [place] [address]   04/12/2018 -   Chief Complaint  Patient presents with   Consult    Pt is a former [facility] pt.  Pt  denies any current complaints of cough, SOB, or CP but states the cough she originally had ended her up in the hosp 9/11-9/17 with dx acute respiratory failure. Pt does wear 2pulse with exertion and also wears 2L continuous when at home.     HPI Stacey [name] 82 y.o. -presents for follow-up to the ILD clinic.  She is known to have rheumatoid arthritis with ILD changes.  She had been followed by Dr. Sandrea Hughs.  However in July 2019 in September 2019 she has had 2 admissions to the hospital with respiratory distress and hypoxemic respiratory failure.  In the first 1 that seem to be right lower lobe infiltrate and then she improved from it but in the second 1 even though the right lower lobe infiltrates were better she still was hypoxemic.  Acid reflux and dysphagia was considered a possible etiology but she passed swallow study 2 times.  They thought she had some reflux.  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  At the same time it is not clear if the ILD is progressive based on pulmonary function testing below   At this point in time she tells me that she is getting home physical therapy.  Her fatigue is improving but it is not fully resolved.  She was discharged on continuous oxygen which she is using.  However she is feeling less short of breath.  Today in fact when we turned her oxygen off and walked her she did not desaturate and this is a significant improvement.  She is on monthly Orencia through the [facility] rheumatologist Dr. Rexene Agent.  At this point in time she is put the Orencia on hold.  She told me that she is been on Orencia for 4 years and never had a respiratory exacerbation still recently x 2.  Although before going on Orencia she had pneumonia while on Remicade and the Remicade.  In terms of her rheumatoid arthritis she hardly has any pain.  Her joint architecture is fairly well-preserved because of various immunomodulators over  time.  She says that she was on Remicade for years and when she stopped it for 8 weeks before the switch to Orencia she never really had a relapse in her rheumatoid arthritis.  She is largely pain and stiffness free.  She believes she can go without  her Orencia for a while.  Review of the literature shows greater than 10% chance of a respiratory infection especially COPD exacerbation.  Although the time frame for this is unclear.       OV 06/07/2018  Subjective:  Patient ID: Stacey [name], female , DOB: [birth year] , age 52 y.o. , MRN: [id number] , ADDRESS: [address] [place] [address]   06/07/2018 -   Chief Complaint  Patient presents with   Follow-up    ILD, PFT done today, some wheezing and coughing but better tha before   Rheumatoid arthritis ILD and asthma/obstructive lung disease phenotype on Dulera  HPI Stacey [name] 82 y.o. -presents for routine follow-up with her husband.  She is here to follow-up with [facility] Dr. Aundria Rud.  She plans to do this in  December 2019.  She continues to be off Orencia.  Her joints are slowly getting stiff again.  She believes that she will need to be back on immunosuppression agent again.  She currently continues Medrol 4 mg alternating with 2 mg.  This for her rheumatoid arthritis.  In terms of her joints she continues on Medrol 4 mg alternating with 2 mg but not on any other immunosuppression agent.  Overall she is been stable but for the last 2 weeks has had green sputum and wheezing and chest congestion and cough.  She recently visited her husband who was hospitalized and walking the long hallways at [facility] made a short of breath but she thinks this is probably baseline for her.  There are no other new issues.  She did have spirometry and DLCO and this shows a decline compared to September 2019 and a similar to July 2019.  It is documented below.  This is probably reflective of a flareup   OV  07/21/2018  Subjective:  Patient ID: Stacey [name], female , DOB: [birth year] , age 16 y.o. , MRN: [id number] , ADDRESS: [address] [place] [address]   07/21/2018 -   Chief Complaint  Patient presents with   Follow-up    Pt states she has been doing well since last visit. States she is about to begin Rituxan with [facility]. Pt still becomes SOB with exertion. Denies any complaints of cough or CP.   Rheumatoid arthritis ILD and asthma/obstructive lung disease phenotype on Dulera  HPI Stacey [name] 82 y.o. -presents for follow-up of the above.  Last seen just before Thanksgiving 2019.  In the interim overall stable although on June 23, 2018 she climbs a steep flight of stairs which is unusual exertion for her and she became very dyspneic.  Following day saw Dr. Aundria Rud at [facility] rheumatology and was given Z-Pak and prednisone and started feeling better.  Although it is not fully clear to me she had fever and bronchitic symptoms.  I reviewed Dr. Aundria Rud note.  Dr. Aundria Rud is decided to start Rituxan for rheumatoid arthritis.  She is only having some minimal joint pain at this point.  She is off Orencia and continues to be off Orencia.  She did have some blood work with [place] before starting Rituxan.  She is due to see Dr. Aundria Rud within the next week and start her Rituxan.  Her liver function test July 18, 2017 is normal hemoglobin is normal.  CRP is also normal.  We did spirometry and walking desaturation test.  These show improvement compared to before and these are documented below.  Currently she not using nighttime or daytime oxygen.  She is wondering if she could switch rheumatology care to [facility].  This is because while she likes [employer] she is getting older and more frail and feels some body local would be of help.  I have sent a message to Dr. Corliss Skains inquiring.  Certainly we can help her with Rituxan infusions at [facility] if needed.  She  will check on this with her [facility] rheumatologist.       OV 03/27/2019  Subjective:  Patient ID: Stacey [name], female , DOB: 08/13/1940 , age 44 y.o. , MRN: [id number] , ADDRESS: [address] [place] [address]  Rheumatoid arthritis ILD and asthma/obstructive lung disease phenotype on [facility]   03/27/2019 -  Rourine fu   HPI Stacey [name] 82 y.o. -presents for  the above.  Last seen in January 2020.  After that she has seen Dr. Aundria Rud rheumatology at [facility].  She is now getting Rituxan 2 doses every 6 months.  She says this is helped her joints and her stiffness.  She is a little bit more mobile than usual.  However in terms of her respiratory status she continues to have episodic cough.  In June 2020 she again got hypoxemic and got admitted.  Since then she has had episodic cough.  She had a respiratory exacerbation in June 2020 for the admission she got steroids.  This seemed to help.  She is also on a higher dose Dulera right now.  In terms of her cough this seems to be her biggest problem.  She seems to be on Dulera, Singulair scheduled with also Tessalon and Delsym and DuoNeb.  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  Her last high-resolution CT scan of the chest was 1 year ago.  She says the dyspnea itself is not worse.  She is currently not using oxygen.  On exam she did have some wheezing.  Currently she has white and brown sputum but this is baseline.  In terms of a COVID wrist she has been tested recently couple of times and this is been negative.  She is isolating well.  She wanted to know COVID prevention activities and risk status and masking strategies.      OV 05/24/2019  Subjective:  Patient ID: Stacey [name], female , DOB: 25-Jul-1940 , age 70 y.o. , MRN: [id number] , ADDRESS: [address] [place] [address]   05/24/2019 -   Chief  Complaint  Patient presents with   Follow-up    Pt was recently in the hosp due to ILD. Pt states that she has been better since being out of the hosp.   Rheumatoid arthritis ILD and asthma/obstructive lung disease phenotype on Dulera/Singulair  Post herpetic neuralgia - on elavil, gabapentin, effexor  HPI Stacey [name] 82 y.o. -returns for follow-up.  At the last visit approximately 2 months ago she was reporting worsening cough following an admission in summer 2020.  Her pulmonary function test suggested worsening ILD status.  Therefore we requested a high-resolution CT chest which she did in October 2020.  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  However in the interim after the CT scan was done towards the end of October 2020 she developed worsening of her cough over 2 weeks and also associated shortness of breath but significantly the cough is much worse.  She ended up getting admitted to the hospital.  There was some hypoxemia.  By this time she had finished a ENT evaluation that did not show any involvement of the arytenoids.  Pulmonary was consulted.  She was given a prednisone burst which she just finished I believe yesterday.  She is back on her baseline Medrol.  And she is feeling better.  Her oxygen status is improved although she is using oxygen at night now.  She is really frustrated with these recurrent flareups and these admissions which ended up with her having a wheeze and also hypoxemia.  Currently she is on her baseline Medrol for rheumatoid arthritis associated with Dulera and Singulair.  She is also on losartan for blood pressure.  Her walking desaturation test is slightly worse than baseline.  She has a GI consult pending because of the recurrent episodes of cough and flareups.  We went over exposure history.  We used interstitial lung disease questionnaire for the exposure history.  Specifically she denies any electronic cigarette use of  marijuana use of cocaine use or any IV drug abuse.  [place] last 14 years.  Asked extensive questions about the home environment it is positive for nebulizer use but the nebulizer does not have mildew or mold in it.  Otherwise no organic antigen exposure.  The house is not damp.  There is no mold or mildew in the shower curtain.  There is no humidifier use no steam iron use.  No Jacuzzi use.  No misting [place] outside to inside the house.  No pet birds.  No pet gerbils no feather pillows.  There is no mold in the [facility] duct.  She does not do any gardening.  Does not use wind instruments.  Also 122 question occupational history elicited and essentially negative.  The other issue is that she has polypharmacy.  She is asking for my help in reducing her medications.  She is on 3 medications for postherpetic neuralgia.  She is on losartan      OV 06/21/2019  Subjective:  Patient ID: Stacey [name], female , DOB: October 29, 1940 , age 41 y.o. , MRN: [id number] , ADDRESS: [address] [place] [address]   06/21/2019 -   Chief Complaint  Patient presents with   televisit    hosp 11/29-12/4 due to covid with pna. pt said that she is doing okay after recent hosp but states she has no energy.      Stacey [name] 82 y.o. - last visit 05/24/2019. Diagnosed with covid-19 on 05/31/2019. Admitted 06/11/2019  - 06/16/2019. Quarantine ends 06/21/2019 today. Rx with Remdesviri and Decadron. Husband also on phone. Questions  1. Quarantine ends - from 06/22/19\ and she is not contagious and likely resistant to reinfection to covid for another few months  2. She is on 10 day dexamethasone for covid - today is last day for it  3. She is on dulera. Hospital gave combivent and she does not want do combivent - this is fine  4. Ofev for ILD - not started it yet . She had questions about side effects. Explained GI and LFT monirtong. SHe wanted to  wait till Jan 2021 and start it . I am ok with that.   5. Small hiat   IMPRESSION: HRCT OCt 2020 1. 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 Findings have progressed since 2017 and 2019 high-resolution chest CT studies. 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 Findings are consistent with UIP per consensus guidelines: Diagnosis of Idiopathic Pulmonary Fibrosis: An Official ATS/ERS/JRS/ALAT Clinical Practice Guideline. Am Rosezetta Schlatter Crit Care Med Vol 198, Iss 5, [phone], Mar 13 2017. 2. 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 4. Small hiatal hernia.   Aortic Atherosclerosis (ICD10-I70.0).     Electronically Signed   By: Delbert Phenix M.D.   On: 04/24/2019 13:29    OV 08/03/2019 - face to face visit  Subjective:  Patient ID: Stacey [name], female , DOB: 08/06/1940 , age 54 y.o. , MRN: [id number] , ADDRESS: [address] [place] [address]    Rheumatoid arthritis with Joint s/ILD- Rituxan/steroids ([facility])  - progressive phenotype (CT 2017/2019 -> Oct 2020). Planned Ofev start in Nov 2020 but did not start as of dec 2020. LFT normal Jan 2021  Athma/obstructive lung disease phenotype on Dulera/Singulair  Post herpetic neuralgia - on elavil, gabapentin, effexor  Coronary Artery Calcification - 1 vessel - cardiology referral done  Diagnosed with covid-19 on 05/31/2019. Admitted 06/11/2019  - 06/16/2019. Rx with Remdesviri and Decadron.  Small Hiatal hernia onCT Oct 2020  Rib fracture from fall Dec 2020: Musculoskeletal: There is nondisplaced fractures of the posterior left fifth, 6, 8, 9, and tenth left ribs. There is unchanged slight superior compression deformities of the T5, T6, T10 vertebral bodies with less than 25% loss in height.Associated pulmnary contusion  +    HPI Stacey [name] 82 y.o. -presents for a visit. Her nintedanib has been delayed because of COVID-19. Also subsequently a fall. She saw a nurse practitioner on July 20, 2019 and they took a shared decision to start nintedanib. She had a ? telephone visit with Dr. Kennieth Francois the rheumatologist at [facility]. Patient scheduled for her next Rituxan in February 2021 but reviewed the notes indicates this could be pushed to early spring 2021. Dr. Aundria Rud is okay with the patient study got intubated at this point in time.  Patient has follow-up with Dr. Aundria Rud tomorrow at [facility].  At this face-to-face visit she is here with her husband.  She says she is much better after the Covid and also the fall that fractured her ribs.  Nevertheless all this is left her fatigue.  Infective fatigue scores are much worse.  Also in the last 2 weeks has had increased cough wheezing and shortness of breath.  The sputum was also changed color in the last 1 week to clear green.  This is all new.  Her symptom score is therefore a worse than her baseline.  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  She is only taking 100 mg once a day.  The plan was to go up to 100 mg twice a day next week.  This is a minimum effective dose.  The maximum dose is 150 mg twice a day.  Given her age and comorbidities we are starting at a low dose.  Part of this visit is to make sure that she is tolerating the drug fine.  And so far she is.  She is interested in the ILD-pro registry study done by the [facility] clinical research Institute.   She is working with physical therapy for her fatigue.  Ambulatory Walk 07/20/2019 2 Lap- O2 92% RA; HR 109 No shortness of breath She did not need to stop Used walker    OV 09/20/2019  Subjective:  Patient ID: Stacey [name], female , DOB: 03/23/1941 , age 43 y.o. , MRN: [id number] , ADDRESS: [address] [place]  [place] [address]   09/20/2019 -   Chief Complaint  Patient presents with   Televisit    Called and spoke with pt who stated she has been feeling okay since last visit. Pt stated she is still taking OFEV and denies any complaints. Pt states she believes her breathing is stable at this point.    Rheumatoid arthritis with Joint s/ILD- Rituxan/steroids ([facility])  - progressive phenotype (CT 2017/2019 -> Oct 2020). Planned Ofev start in Nov 2020 but did not start as of dec 2020. 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 Admitted 06/11/2019  - 06/16/2019. Rx with Remdesviri and Decadron.  Small Hiatal hernia on CT Oct 2020  Rib fracture from fall Dec 2020: Musculoskeletal: There is nondisplaced fractures of the posterior left fifth, 6, 8, 9, and tenth left ribs. There is unchanged slight superior compression deformities of the T5, T6, T10 vertebral bodies with less than 25% loss in height.Associated pulmnary contusion +   HPI YEILIN RAPALO 82 y.o. - has 2nd covid vaccine later this week. Rituxan is end of the month. Baseline RA regiment is On dulera for associated obstructio of lung. Continue ofev 100mg  bid since mid jan 2021 for ILD. No side effects. Did see Buelah Manis for face to face visit early feb 2021 -> for bronchitis and felt better after prdnisone and zpak. Currently on medrol   8 mg day and staying there per Buelah Manis . Wants t oknow if she can taper. Wants to know how she can prevent care flare ups. Explained ofev, masking and social distancing  prevent respiratory infection.Denies choking on food or aspirating. Denies mold in house - relatively news. Denies dog. Denies cat. Denies carious teeth. Denies post nasal drip  OVerll feels better and improved dyspnea.     OV 12/18/2019  Subjective:  Patient ID: Stacey [name], female , DOB: September 07, 1940 , age 68 y.o. , MRN: [id number] , ADDRESS: [address] [place] [address]  PCP [name], MD Rheumatologist-Dr. Rexene Agent at [facility] Pulmonary/ILD: Dr. Jeni Salles  12/18/2019 -   Chief Complaint  Patient presents with   Follow-up    no worse   Rheumatoid arthritis with Joint s/ILD- Rituxan/steroids ([facility])  - progressive phenotype (CT 2017/2019 -> Oct 2020). Planned Ofev start in Nov 2020 but did not start as of dec 2020. 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 Admitted 06/11/2019  - 06/16/2019. Rx with Remdesviri and Decadron.  Small Hiatal hernia on CT Oct 2020  Rib fracture from fall Dec 2020: Musculoskeletal: There is nondisplaced fractures of the posterior left fifth, 6, 8, 9, and tenth left ribs. There is unchanged slight superior compression deformities of the T5, T6, T10 vertebral bodies with less than 25% loss in height.Associated pulmnary contusion +  -On Reclast once a year through Dr. Rexene Agent   HPI Stacey [name] 82 y.o. 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  It has been a few month since I last saw her.  In this time she did not taper her Medrol down.   She stated 8 mg/day.  She was  originally taking the Medrol for her rheumatoid arthritis for many years.  She recently increased the dose from 4 mg / 2 mg every other day to 8 mg daily because of recurrent respiratory exacerbations.  She is asking for [facility] refills for obstructive lung disease.  She is getting Rituxan through Dr. Rexene Agent for rheumatoid arthritis.  Her next Rituxan is in September 2021.  She saw Dr. Aundria Rud in April 2021 and reviewed the note.  Her liver function test at the time was fine.  In terms of her ILD she continues on nintedanib 100 mg twice daily.  She says she has no side effects from the drug she is tolerating it quite well.  We discussed about increasing the dose but she wants to hold off because she just got a new supply.  However she is open to increasing the dose when the supply runs out.  She is due for liver function test today.  In terms of overall symptoms things have improved as can be seen below and the symptom score.Marland Kitchen  However her main concern is that of fatigue.  She is open to attending pulmonary rehabilitation.  She is not using oxygen at home.  Sometime back when we tested overnight oxygen desaturation test she did not desaturate.  Walking desaturation test today stable.  She does notice of note that her blood pressure has gone up.  She is working with her primary care physician on this.  She is on losartan.    OV 04/10/2020   Subjective:  Patient ID: Stacey [name], female , DOB: Mar 17, 1941, age 35 y.o. years. , MRN: [id number],  ADDRESS: [address] [place] [address] PCP  [name], MD Providers : Treatment Team:  Attending Provider: Kalman Shan, MD   Chief Complaint  Patient presents with   Follow-up    CT scan 8/23, has not increased Ofev, shortness of breath with exertion.    Rheumatoid arthritis with Joint s/ILD- Rituxan/steroids ([facility] uinvi[facility])  - progressive phenotype (CT 2017/2019 -> Oct 2020). Planned Ofev  start in Nov 2020 but did not start as of dec 2020. 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 Admitted 06/11/2019  - 06/16/2019. Rx with Remdesviri and Decadron.  Admitted August 2021 for respiratory infection not otherwise specified.  Following trip to [place].  Was hypoxemic.  Small Hiatal hernia on CT Oct 2020  Rib fracture from fall Dec 2020: Musculoskeletal: There is nondisplaced fractures of the posterior left fifth, 6, 8, 9, and tenth left ribs. There is unchanged slight superior compression deformities of the T5, T6, T10 vertebral bodies with less than 25% loss in height.Associated pulmnary contusion +  -On Reclast once a year through Dr. Rexene Agent    HPI Stacey [name] 82 y.o. -returns for follow-up.  Last week by myself early June 2021.  Then after that in early August 2020 when she got admitted for respiratory infection not otherwise specified.  She was hypoxemic on room  air.  This followed a trip to [place].  In the follow-up phase end of August 2020 when she had high-resolution CT chest that showed increased groundglass opacities but when compared to a CT from 8 months ago.  Also chronic ILD had worsened.  I personally visualized the image at this point in time she is improved from this admission she is not using oxygen at all.  In fact walking desaturation test shows near baseline.  Nevertheless overall her symptom scores have declined over time ILD symptom score is listed  below.  She is really concerned about several issues.  In terms of her ILD she was inquiring about escalating to 150 mg twice daily.  Even at 100 mg twice daily she is significantly symptomatic in terms of nonrespiratory issues such as nausea and dizziness and fatigue.  I did caution her that these could all get worse.  Therefore she is just opted to be at 100 mg twice daily  -Recurrent admissions for respiratory issues [I did address that some of this was Covid and fall related and not necessarily her usual summer exacerbation].  Nevertheless, I asked about exposures at home.  She does not have a feather blanket or feather pillow or feather jacket.  She denies any mold or bird exposure or mildew exposure at home.  Does not do any gardening or wind instruments.  She is immunosuppressed and I did notice that she is not on Bactrim for PCP prophylaxis.  In 2019 when we checked G6PD the lab could not process this lab.  She is interested in Bactrim prophylaxis.  She is interested in okay getting G6PD rechecked   -She is dealing with significant amount of dizziness.  Neurology is sending her to neuro rehab.  It is believed it is multifactorial.  I reviewed her recent CT head angio report.  I personally visualized the image   -She is also worried about cough.  Currently cough is tolerable but she says prior to her exacerbations cough gets worse.  We did discuss with her that if she is aspirating which she denied.  We did discuss about the possibility of starting Bactrim and monitoring her cough and she is fine with that.  I did review old records Lungs/Pleura: Biapical pleuroparenchymal scarring. Patchy and largely peripheral peribronchovascular ground-glass, increased from 07/03/2019. Underlying subpleural reticulation, traction bronchiectasis/bronchiolectasis and scattered honeycombing, similar to minimally increased from 07/03/2019. Calcified granulomas. No pleural fluid. Airway is unremarkable. No air  trapping.   Upper Abdomen: Visualized portions of the liver, adrenal glands, kidneys, spleen, pancreas, stomach and bowel are unremarkable with the exception of a small hiatal hernia. Cholecystectomy. Calcified upper abdominal lymph nodes.   Musculoskeletal: Degenerative changes in the spine. No worrisome lytic or sclerotic lesions.   IMPRESSION: 1. Increased patchy pulmonary parenchymal ground-glass may be due to an atypical/viral pneumonia, including due to COVID-19. 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 2. Aortic atherosclerosis (ICD10-I70.0). Coronary artery calcification.     Electronically Signed   By: Leanna Battles M.D.   On: 03/04/2020 12:58    OV 08/01/2020  Subjective:  Patient ID: Stacey [name], female , DOB: 02/02/1941 , age 75 y.o. , MRN: [id number] , ADDRESS: [address] [place] [address] PCP [name], MD Patient Care Team: [name], MD as PCP - General (Internal Medicine) Jodelle Red, MD as PCP - Cardiology (Cardiology)  This Provider for this visit: Treatment Team:  Attending Provider: Kalman Shan, MD   Rheumatoid arthritis with Joint s/ILD- Rituxan/steroids ([facility] u[facility])  - progressive phenotype (CT 2017/2019 -> Oct 2020). Planned Ofev start in Nov 2020 but did not start as of dec 2020. 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 Admitted 06/11/2019  - 06/16/2019. Rx with Remdesviri and Decadron.  Admitted August 2021 for respiratory infection not otherwise specified.  Following trip to [place].  Was hypoxemic.  Small Hiatal hernia on CT Oct 2020  Rib fracture from fall Dec 2020: Musculoskeletal: There is nondisplaced fractures of the posterior left fifth, 6, 8, 9, and tenth left ribs. There is unchanged slight superior compression deformities of the T5, T6, T10 vertebral bodies with less than 25% loss in height.Associated pulmnary contusion +  -On Reclast once a year through Dr. Rexene Agent    08/01/2020 -   Chief Complaint  Patient presents with   Follow-up    Doing well     HPI Stacey [name] 82 y.o. -returns for follow-up.  She says since last visit she has lost weight.  In fact tracking her weight it appears she is definitely lost weight.  She has occasional intermittent nausea once every few weeks this is because she does not time her nintedanib well with food.  She has a low appetite as well.  She tried to go up on the Internet but she is unable to.  She takes a low-dose 100 mg twice daily.  She is on Rituxan and prednisone.  She is up-to-date with her COVID-vaccine.  Current shortness of breath standpoint she is stable.  Symptoms are stable.  CT Chest data  OV 10/22/2020  Subjective:  Patient ID: Stacey [name], female , DOB: 03-17-1941 , age 1 y.o. , MRN: [id number] , ADDRESS: [address] [place] [address] PCP [name], MD Patient Care Team: [name], MD as PCP - General (Internal Medicine) Jodelle Red, MD as PCP - Cardiology (Cardiology)  This Provider for this visit: Treatment Team:  Attending Provider: Kalman Shan, MD    10/22/2020 -   Chief Complaint  Patient presents with   Follow-up    Still having shortness of breath with activity, denies  increase in severity.     Rheumatoid arthritis with Joint s/ILD- Rituxan/steroids ([facility] uinviesty)  - progressive phenotype (CT 2017/2019 -> Oct 2020). Planned Ofev start in Nov 2020 but did not start as of dec 2020. 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 Admitted 06/11/2019  - 06/16/2019. Rx with Remdesviri and Decadron.  - sp evushed x 2  Admitted August 2021 for respiratory infection not otherwise specified.  Following trip to [place].  Was hypoxemic.  Small Hiatal hernia on CT Oct 2020  Rib fracture from fall Dec 2020: Musculoskeletal: There is nondisplaced fractures of the posterior left fifth, 6, 8, 9, and tenth left ribs. There is unchanged slight superior compression deformities of the T5, T6, T10 vertebral bodies with less than 25% loss in height.Associated pulmnary contusion +  -On Reclast once a year through Dr. Rexene Agent  Normal ECHO Summer 2020   HPI Stacey [name] 82 y.o. -returns for follow-up.  At this point in time for her ILD she is taking nintedanib 100 mg twice daily.  Last liver function test was in December 2021.  She is now attending pulmonary rehabilitation.  She is not sure it is helping her.  She tells me that she does not desaturate at pulmonary rehabilitation so she not using her oxygen.  Review of the records indicate same but it appears that today her dyspnea is worse in terms of symptom score although she is denying that.  Walking desaturation  test in the office today with a forehead probe suggest that pulse ox is declined.  Her pulmonary function test also shows 5% FVC decline.  She was surprised by this.  In talking to her realize that for coexistent obstructive lung disease she is no longer taking Dulera.  She is willing to take inhaler which she will have to twist instead of having to pump with her fingers because she has rheumatoid arthritis.  She is no longer losing weight though.  She had questions about taking monoclonal antibody prophylaxis.  She is under the impression she needs to take it every 6 months with Rituxan dosing.  I did explain to her that in the presence of Rituxan the body is not able to respond actively to vaccine and that is why she is having prophylactic antibody.  She is now had 2 doses the last 1 being a few weeks ago first 1 was in January 2022.  I have written to the [facility] coordinator Lillard Anes 27 monoclonal antibody prophylaxis schedule.  Expressed to patient that because of monoclonal antibody prophylaxis she did not do the vaccine.  Last echocardiogram was in summer 2020.      OV 12/23/2020  Subjective:  Patient ID: Stacey [name], female , DOB: 04-Sep-1940 , age 36 y.o. , MRN: [id number] , ADDRESS: [address] [place] [address] PCP [name], MD Patient Care Team: [name], MD as PCP - General (Internal Medicine) Jodelle Red, MD as PCP - Cardiology (Cardiology)  This Provider for this visit: Treatment Team:  Attending Provider: Kalman Shan, MD   12/23/2020 -   Chief Complaint  Patient presents with   Follow-up    PFT performed today. Pt states she has been doing okay since last visit and denies any complaints.     HPI Stacey [name] 82 y.o. -returns for follow-up.  There is concern for progression.  Last visit we have made sure she added Breo.  She says she feels better.  In fact symptom score is better.  However pulmonary function test shows  continued decline.  She is on the low-dose nintedanib.  She says if she does not take the nintedanib exactly with food and even if  she takes it 15 to 20 minutes later she will have nausea.  Today she had nausea but otherwise if she is diligent she can maintain.  I informed her the pulmonary function test shows continued decline.  She is on Rituxan prednisone and low-dose nintedanib.  She initially said she will not be able to tolerate the higher dose nintedanib but after realizing that her nausea is mild and her weight loss is stable she says she is willing to give the higher dose nintedanib and attend.  I was able to get her some donor sample of nintedanib.  The lot number of this is [id number].  Expiration date is November 2023.  The serial number is [id number].  The GT IN number is [id number]       ECHO 11/3120  IMPRESSIONS     1. Left ventricular ejection fraction, by estimation, is 65 to 70%. The  left ventricle has normal function. The left ventricle has no regional  wall motion abnormalities. Left ventricular diastolic parameters are  consistent with Grade I diastolic  dysfunction (impaired relaxation). Elevated left ventricular end-diastolic  pressure. The E/e' is 80. The average left ventricular global longitudinal  strain is -18.4 %.   2. Right ventricular systolic function is normal. The right ventricular  size is normal. There is mildly elevated pulmonary artery systolic  pressure. The estimated right ventricular systolic pressure is 36.9 mmHg.   3. The mitral valve is abnormal. Mild mitral valve regurgitation.   4. The aortic valve is tricuspid. Aortic valve regurgitation is not  visualized. Mild aortic valve sclerosis is present, with no evidence of  aortic valve stenosis.   5. The inferior vena cava is normal in size with greater than 50%  respiratory variability, suggesting right atrial pressure of 3 mmHg.   Comparison(s): Changes from prior study are noted. 12/18/18  EF 60-65%. PA  pressure .    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  OV 03/10/2021  Subjective:  Patient ID: Stacey [name], female , DOB: May 08, 1941 , age 89 y.o. , MRN: [id number] , ADDRESS: [address] [place] [address] PCP [name], MD Patient Care Team: [name], MD as PCP - General (Internal Medicine) Jodelle Red, MD as PCP - Cardiology (Cardiology)  This Provider for this visit: Treatment Team:  Attending Provider: Kalman Shan, MD    03/10/2021 -   Chief Complaint  Patient presents with   Follow-up    Pt states she has questions about ofev she currently on it but having side effects.    Rheumatoid arthritis with Joint s/ILD- Rituxan/steroids ([facility] uin[facility])  - progressive phenotype (CT 2017/2019 -> Oct 2020). Planned Ofev start in Nov 2020 but did not start as of dec 2020. 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 Admitted 06/11/2019  - 06/16/2019. Rx with Remdesviri and Decadron.  - sp evushed x 2  - 2nd covid by hx June 2022 - evusheld repeat Oct 2022  Admitted August  2021 for respiratory infection not otherwise specified.  Following trip to [place].  Was hypoxemic.  Small Hiatal hernia on CT Oct 2020  Rib fracture from fall Dec 2020: Musculoskeletal: There is nondisplaced fractures of the posterior left fifth, 6, 8, 9, and tenth left ribs. There is unchanged slight superior compression deformities of the T5, T6, T10 vertebral bodies with less than 25% loss in height.Associated pulmnary contusion +  -On Reclast once a year through Dr. 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  -returns for follow-up.  She presents with her husband Park Breed.  Last visit she wanted to increase the nintedanib to 150 mg twice daily but this is made her have more nausea and vomiting.  The diarrhea is very mild.  She then reduced her nintedanib 200 mg twice daily but still having side effects.  She is now started losing weight again.  She is really frustrated by this.  Her husband asked about pirfenidone.  Explained that the research with progressive pulmonary fibrosis is limited in the case of pirfenidone so nintedanib is the first choice.  Did agree that if nintedanib did not work we would try pirfenidone.  She wanted know what her options were.  We discussed about stopping nintedanib and improving quality of life but face the risk of continued progression pulmonary fibrosis.  She was not that enthused about this choice.  We discussed about trying Zofran with the nintedanib.  She seemed more receptive to this idea.  We decided that we will try with 100 mg twice daily at the lower dose and then see.  She believes asthma is under control.  Her pulmonary function test shown below seems to fluctuate.  Overall symptom severity stable.  She wanted a handicap placard signed today which I did.       OV 04/08/2021  Subjective:  Patient ID: Stacey [name], female , DOB: 1940/07/30 , age 19 y.o. , MRN: [id number] ,  ADDRESS: [address] [place] [address] PCP [name], MD Patient Care Team: [name], MD as PCP - General (Internal Medicine) Jodelle Red, MD as PCP - Cardiology (Cardiology)  This Provider for this visit: Treatment Team:  Attending Provider: Kalman Shan, MD    04/08/2021 -   Chief Complaint  Patient presents with   Follow-up    Pt states she has been doing okay since last visit and denies any real complaints. States breathing has been doing okay and states the nausea went away.    Rheumatoid arthritis with Joint s/ILD- Rituxan/steroids ([facility]in[facility]esty)  - progressive phenotype (CT 2017/2019 -> Oct 2020). Planned Ofev start in Nov 2020 but did not start as of dec 2020. 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 Admitted 06/11/2019  - 06/16/2019. Rx with Remdesviri and Decadron.  - sp evushed x 2  Admitted August 2021 for respiratory infection not otherwise specified.  Following trip to [place].  Was hypoxemic.  Small Hiatal hernia on CT Oct 2020  Rib fracture from fall Dec 2020: Musculoskeletal: There is nondisplaced fractures of the posterior left fifth, 6, 8, 9, and tenth left ribs. There is unchanged slight superior compression deformities of the T5, T6, T10 vertebral bodies with less than 25% loss in height.Associated pulmnary contusion +  -On Reclast  once a year through Dr. Rexene Agent  Normal ECHO Summer 2020 -> possible [facility] May 2021 RSVP 30s with GR1 ddx  HPI ANIKAH MOWELL 82 y.o. 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  Last seen 4 weeks ago.  At that time she was losing weight and having significant GI side effects with nintedanib full dose.  Told her to reduce the nintedanib dose to 100 mg twice daily and also take Zofran as needed.  She is taking Zofran as needed.  She is tolerating the nintedanib low-dose much better.  Symptom scores are listed below.  Overall she feels better.  Weight loss is stabilized.  Infectious gained 1 pound of weight.  She feels that she will just take with nintedanib 100 mg twice daily using Zofran as needed.  She does not want to change the schedule.  I fully agree with her and supported on that.  She is aware that the low-dose is less effective but this might be best balance between risk and benefit.  She did ask about getting a COVID by Valent mRNA booster 2 oh micron and BA.5.  According to history she has had COVID x2.  Most recently was in June 2022 which would be against the current viral strain.  In addition she is got monoclonal antibody prophylaxis euvsheld in past.  She does socially isolate herself to the extent possible and masks.  She is going to get the monoclonal antibody prophylaxis again in a few weeks when she gets her Rituxan.  We did discuss the fact that because she is on Rituxan she is immunosuppressed and does not make antibodies and then the facet antibody transfer that she gets through [facility] is risk protective.  Additional risk protective feature is the fact that she had a current strain of COVID-19 and potentially has natural immunity.  Therefore it is her call to get the bivaet booster although she wanted to avoid it and I supported this in a shared decision making she could hold off till at least January 2023 when it  will be 6 months since her natural infection.     OV 07/18/2021  Subjective:  Patient ID: Stacey [name], female , DOB: Nov 29, 1940 , age 39 y.o. , MRN: [id number] , ADDRESS: [address] [place] [address] PCP [name], MD Patient Care Team: [name], MD as PCP - General (Internal Medicine) Jodelle Red, MD as PCP - Cardiology (Cardiology)  This Provider for this visit: Treatment Team:  Attending Provider: Kalman Shan, MD    07/18/2021 -   Chief Complaint  Patient presents with   Follow-up    Pt states she has been doing okay since last visit. States  her breathing is about the same.    Rheumatoid arthritis with Joint s/ILD- Rituxan/steroids (duke uinviesty)  - progressive phenotype (CT 2017/2019 -> Oct 2020). Planned Ofev start in Nov 2020 but did not start as of dec 2020. 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 Admitted 06/11/2019  - 06/16/2019. Rx with Remdesviri and Decadron.  - sp evushed x 2  Admitted August 2021 for respiratory infection not otherwise specified.  Following trip to [place].  Was hypoxemic.  Small Hiatal hernia on CT Oct 2020  Rib fracture from fall Dec 2020: Musculoskeletal: There is nondisplaced fractures of the posterior left fifth, 6, 8, 9, and tenth left ribs. There is unchanged slight superior compression deformities of the  T5, T6, T10 vertebral bodies with less than 25% loss in height.Associated pulmnary contusion +  -On Reclast once a year through Dr. Rexene Agent  Normal ECHO Summer 2020 -> possible [facility] May 2021 RSVP 30s with GR1 ddx   xxxx HPI Stacey HOLZER 82 y.o. -presents for follow-up.  Last visit September 2022.  Since then she is doing stable.  With a lower dose of nintedanib no more diarrhea.  Weight loss is stopped.  Barely any nausea.  She is not needing Zofran for nausea control.  Her main concern right now is that she is having imbalance issues.  She is stopped working with the physical therapy.  She thinks it is 1 of proprioception but also long-term muscle loss and sarcopenia.  I have advised her to take this with her primary care/rheumatology.  Terms of asthma is stable on Breo and Singulair.  Last liver function test April 2022 in our chart.  Latest pulmonary function test shows stability    CT Chest data  No results found.            OV 03/06/2022  Subjective:  Patient ID: Stacey [name], female , DOB: [birth year] , age 70 y.o. , MRN: [id number] , ADDRESS: [address] [place] [address] PCP [name], MD Patient Care Team: [name], MD as PCP - General (Internal Medicine) Jodelle Red, MD as PCP - Cardiology (Cardiology)  This Provider for this visit: Treatment Team:  Attending Provider: Kalman Shan, MD    03/06/2022 -   Chief Complaint  Patient presents with   Follow-up    PFT performed today.  Pt states she has been doing okay since last visit. States her breathing has been doing okay but states she has been having some problems with balance.    HPI MARKAILA PECHA 82 y.o. -returns for follow-up.  Last seen in January 2023.  She says that from a breathing standpoint she is stable but she has had balance issues.  She has had 2 accidental falls.  The first 1 was in March 2023 while entering the house along the steps and she  landed in the bushes and had 25 stitches to her left lower extremity.  She also had collarbone fracture.  Then again a fall in the driveway 2 months ago but with just bruising.  She is now going to undergo physical therapy.  ENT evaluation did not show any ENT causes for the fall.  In terms of her dyspnea and walking desaturation test in the office she is stable but her pulmonary function test shows continued decline.  She is on nintedanib at the lower dose because she is not able to tolerate the higher dose.  On this lower dose she still requiring Zofran for control of nausea.  She takes Zofran preemptively.  There are no other issues.  The last echo and CT scan was over a year ago.     OV 12/11/2022  Subjective:  Patient ID: Stacey [name], female , DOB: 1941/05/01 , age 78 y.o. , MRN: [id number] , ADDRESS: [place] [address] [place] [address] PCP Eloisa Northern, MD Patient Care Team: Eloisa Northern, MD as PCP - General (Internal Medicine) Jodelle Red, MD as PCP - Cardiology (Cardiology)  This Provider for this visit: Treatment Team:  Attending Provider: Kalman Shan, MD    12/11/2022 -   Chief Complaint  Patient presents with   Follow-up    F/up, in the hosp. 3 weeks ago, and wants to know why she was in the hosp.     HPI DIANETTE GLOWACKI 82 y.o. -was last seen in August 2023.  After that her husband has passed away 09-20-22 following motor vehicle accident when he was crossing the road.  .  Then she was admitted for 3 days 11/17/2022 through 11/20/2022 with nausea and vomiting.  She was also diagnosed with pneumonia.  The hospitalist contacted me.  Out of caution advised him to hold off giving any nintedanib she did receive IV community-acquired pneumonia therapy.  Review of the labs indicate she did not suffer from acute kidney injury.  Her discharge sodium was slightly low at 134.  Blood cultures negative.She had CT abdomen and pelvis 11/11/2022 that  did not show any acute process in the abdomen. She was discharged to SNF.  She tells me that at baseline she was taking Zofran once daily with some mild nintedanib induced nausea but the onset of nausea for this admission was quite abrupt and severe.  She does not know the cause.  After discharge from the SNF 2 weeks ago and now for the last 10 days she is back taking nintedanib.  She is having some mild drug-induced nausea and she says she is able to combat this with taking Zofran twice daily.  At this point in time she prefers to take Zofran along with the nintedanib and see what happens.   For a respiratory standpoint she is frustrated she not been able to get a pulmonary function test since August 2023.  Her ill health and also PFT machine not working have delayed this.  Subjectively she is feeling the same and clinically she feels stable from an ILD standpoint.  Therefore I suspect it is stable.   OV 03/02/2023  Subjective:  Patient ID: Stacey [name], female , DOB: [birth year] , age 49 y.o. , MRN: [id number] , ADDRESS: [address] [place] [address] PCP [name], MD Patient Care Team: [name], MD as PCP - General (Internal Medicine) Jodelle Red, MD as PCP - Cardiology (Cardiology)  This Provider for this visit: Treatment  Team:  Attending Provider: Kalman Shan, MD   Rheumatoid arthritis with Joint s/ILD- Rituxan/steroids (duke uinviesty)  - progressive phenotype (CT 2017/2019 -> Oct 2020). Planned Ofev start in Nov 2020 but did not start as of dec 2020. 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 Admitted 06/11/2019  - 06/16/2019. Rx with Remdesviri and Decadron.  - sp evushed x 2  Admitted August 2021 for respiratory infection not otherwise specified.  Following trip to [place].  Was hypoxemic.  Small Hiatal hernia on CT Oct 2020  Rib fracture from fall Dec 2020: Musculoskeletal: There is nondisplaced fractures of the posterior left fifth, 6, 8, 9, and tenth left ribs. There is unchanged slight superior compression deformities of the T5, T6, T10 vertebral bodies with less than 25% loss in height.Associated pulmnary contusion +  -On Reclast once a year through Dr. Rexene Agent  Normal ECHO Summer 2020 -> possible [facility] May 2021 RSVP 30s with GR1 ddx  -  Normal ECHO may 2022    03/02/2023 -   Chief Complaint  Patient presents with   Follow-up    F/up on PFT     HPI RAYNIYAH OVERTURF 82 y.o. -returns for follow-up.  She at this point is tolerating her nintedanib at 100 mg twice daily but she is increased her Zofran.  This visit is to see her pulmonary function test.  In May 2024 she did have CT abdomen the lung images and that she did show progressive ILD compared to 2020 07/2020.  Her pulmonary function test during this time had declined but today the FVC is improved while the DLCO is worse.  I think overall this will be stable compared to a year ago but directionally she does have progressive phenotype.  Her last liver function test was in May 2024.  She will have repeat liver function test today.  She also will participate in the ILD-Pro research registry protocol.  I did discuss with her about inhaled pirfenidone versus placebo in a clinical trial development protocol.  She is interested in this and I gave her a copy of the  consent.    SYMPTOM SCALE - ILD 03/27/2019  05/24/2019 (2-3 weeks pre cpovid)  08/03/2019 Post covid Post fall rib # 12/18/2019  04/10/2020 142# resp virus admit aug 2021 nos 08/01/2020 134#  10/22/2020 136# Now doing rehab 12/23/2020 136# 03/10/2021 131# Ofev 150mg  bid 04/08/2021 132# Ofev 100mg  bid 07/18/2021 133# ofev 100 x 2 03/06/2022 132#, ofev 100x2 12/11/2022 128# 03/02/2023   O2 use ra       ra ra ra ra ra ra   Shortness of Breath 0 -> 5 scale with 5 being worst (score 6 If unable to do)               At rest 0 2 3 2 3 0 3 0 3 1  2  1    Simple tasks - showers, clothes change, eating, shaving 0 3 2 3 4 1 3 2 3 1  3 3    Household (dishes, doing bed, laundry) 2 4 5 3 4 3 3 3 4 3 3 4     Shopping 2 3 6 4 4 4 4 3 4 4 4 4     Walking level at own pace 2 3 4 4 3 2 4 2 3 3 3 4     Walking up Stairs 3 3 5 4 4 4 4 3 4 4 4 5     Total (40 - 48) Dyspnea Score 9 17 25 20 22 14 21 13 21 16 19 21     How bad is your cough? 2 2 3 1 2  0 0 0 1 0 0 0    How bad is your fatigue 3 3.5 5 3.5 3 4 3 2 3 3 4 4     nause    0 1 1 0 1 2.5 1 1 1     vomit    0 1 1 0 1 2.4 1 1 1     diarrhea    0 1 0 0 0 1 0 0 0    anxiety    0 2 0 1 0 1 0 0 1    depression    0 1 0 0 0 0 0 0 0       Simple office walk  feet x  3 laps goal with forehead probe 04/12/2018  07/21/2018  05/24/2019  12/18/2019  04/10/2020  08/01/2020  10/22/2020  03/10/2021  07/18/2021  03/06/2022  12/11/2022    O2 used Room air - off o2 x 10 min Room air  Room air ra ra ra ra ra ra ra ra   Number laps completed 3 x 185 feet 3 x 250 feet    r    3 1 lap   Comments about pace normal normal   Slow, no walker Slow . No walker.  Slow. No walker. No cane walker   waler   Resting Pulse Ox/HR 99% and 72/min 97% and 74/min 97% and 81/min 97% and 76/m 96% and 88/min 94% and 83 96% and HR 79 97% and HR 51 100% and 78 99% and HR 73 97% and HR 76   Final Pulse Ox/HR 98% and 95/min 96% and 96/min 94% and 107/min 93% and 94 92% and 97 90% and 100 89% and 104/min  92% and 106 97% and 102 92% and HR 105 94% ad HR 80   Desaturated </= 88% no no no no no yes yes no no no    Desaturated <= 3% points no no Yes, 3 pints Ues, 4 points Yes,  4 points Yes, 4 points Yes, 7 points  Yes, 3 pints Yes, 7 poins    Got Tachycardic >/= 90/min yes yes yes yes yes yes yes   yes    Symptoms at end of test No complaint Mild dyspnea Mod dyspnea Some dyspnea dyspnea dyspnie cat 3d Dyspneic moderate  Dyspneic and wheezing Mod dyspnea Mopd dyspea    Miscellaneous comments improed from hospital Same v improved woprse same  Wobbly gait baseline Needed 2 break  Stopped x 2 to get balanced and staggered while alking       PFT     Latest Ref Rng & Units 03/01/2023   11:45 AM 03/06/2022    3:01 PM 07/02/2021    3:07 PM 03/07/2021   11:05 AM  12/23/2020    1:03 PM 10/22/2020    9:05 AM 09/10/2020    9:52 AM  ILD indicators  FVC-Pre L 1.81  P 1.62  1.69  1.78  1.66  1.73  1.76   FVC-Predicted Pre % 69  P 61  62  66  60  63  64   DLCO uncorrected ml/min/mmHg 11.53  P 12.57  15.35  12.94  14.98  13.67  15.38   DLCO UNC %Pred % 59  P 65  79  66  77  70  79   DLCO Corrected ml/min/mmHg 11.53  P 12.57  15.35  12.94  14.98  13.80  15.38   DLCO COR %Pred % 59  P 65  79  66  77  71  79     P Preliminary result      LAB RESULTS last 96 hours No results found.  LAB RESULTS last 90 days Recent Results (from the past 2160 hour(s))  Basic Metabolic Panel (BMET)     Status: None   Collection Time: 12/11/22  4:39 PM  Result Value Ref Range   Glucose 85 70 - 99 mg/dL   BUN 15 8 - 27 mg/dL   Creatinine, Ser 4.09 0.57 - 1.00 mg/dL   eGFR 83 >81 XB/JYN/8.29   BUN/Creatinine Ratio 21 12 - 28   Sodium 139 134 - 144 mmol/L   Potassium 5.1 3.5 - 5.2 mmol/L   Chloride 100 96 - 106 mmol/L   CO2 27 20 - 29 mmol/L   Calcium 9.2 8.7 - 10.3 mg/dL  Hepatic function panel     Status: Abnormal   Collection Time: 12/11/22  4:39 PM  Result Value Ref Range   Total Protein 5.8 (L) 6.0 - 8.5 g/dL    Albumin 3.9 3.7 - 4.7 g/dL   Bilirubin Total <5.6 0.0 - 1.2 mg/dL   Bilirubin, Direct <2.13 0.00 - 0.40 mg/dL   Alkaline Phosphatase 76 44 - 121 IU/L   AST 19 0 - 40 IU/L   ALT 11 0 - 32 IU/L  Pulmonary function test     Status: None (Preliminary result)   Collection Time: 03/01/23 11:45 AM  Result Value Ref Range   FVC-Pre 1.81 L   FVC-%Pred-Pre 69 %   FEV1-Pre 1.39 L   FEV1-%Pred-Pre 71 %   FEV6-Pre 1.81 L   FEV6-%Pred-Pre 73 %   Pre FEV1/FVC ratio 76 %   FEV1FVC-%Pred-Pre 104 %   Pre FEV6/FVC Ratio 100 %   FEV6FVC-%Pred-Pre 105 %   FEF 25-75 Pre 1.15 L/sec   FEF2575-%Pred-Pre 87 %   DLCO unc 11.53 ml/min/mmHg   DLCO unc % pred 59 %   DLCO cor 11.53 ml/min/mmHg   DLCO cor % pred 59 %   DL/VA 0.86 ml/min/mmHg/L   DL/VA % pred 78 %         has a past medical history of Abnormal finding of blood chemistry, Asthma, H/O measles, H/O varicella, Hypertension, 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HCC), 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   reports that she has never smoked. She has never used smokeless tobacco.  Past Surgical History:  Procedure Laterality Date   CHOLECYSTECTOMY  2011   IR ANGIO INTRA EXTRACRAN SEL COM CAROTID INNOMINATE BILAT MOD SED  06/18/2020   IR ANGIO VERTEBRAL SEL SUBCLAVIAN INNOMINATE UNI R MOD SED  06/18/2020   IR ANGIO VERTEBRAL SEL  VERTEBRAL UNI L MOD SED  06/18/2020   WISDOM TOOTH EXTRACTION      Allergies  Allergen Reactions   Penicillins Other (See Comments)   Remicade [Infliximab] Other (See Comments)    Reaction unknown    Immunization History  Administered Date(s) Administered   Fluad Quad(high Dose 65+) 04/23/2020, 03/23/2022   Influenza Split 04/12/2013, 04/12/2014, 03/14/2015, 04/23/2016   Influenza, High Dose Seasonal PF 04/12/2017, 03/24/2018, 04/13/2019, 03/27/2021    Influenza-Unspecified 04/03/2013, 04/22/2017   Moderna Sars-Covid-2 Vaccination 08/25/2019, 09/22/2019, 03/07/2020   Pfizer Covid-19 Vaccine Bivalent Booster 44yrs & up 05/16/2021   Pneumococcal Conjugate-13 04/03/2013, 09/28/2014   Pneumococcal Polysaccharide-23 07/13/2012, 07/24/2013   Respiratory Syncytial Virus Vaccine,Recomb Aduvanted(Arexvy) 04/12/2022   Td 01/11/2018   Tdap 12/10/2017    Family History  Problem Relation Age of Onset   Asthma Mother    Anemia Mother    Polymyalgia rheumatica Mother    COPD Father    Pulmonary fibrosis Father    Breast cancer Maternal Grandmother 55     Current Outpatient Medications:    amitriptyline (ELAVIL) 25 MG tablet, Take 25 mg by mouth at bedtime., Disp: , Rfl: 0   Biotin 1000 MCG tablet, Take 1,000 mcg by mouth daily., Disp: , Rfl:    BREO ELLIPTA 100-25 MCG/ACT AEPB, Inhale 1 puff into the lungs daily., Disp: 60 each, Rfl: 5   cholecalciferol (VITAMIN D) 1000 UNITS tablet, Take 1,000 Units by mouth daily., Disp: , Rfl:    gabapentin (NEURONTIN) 300 MG capsule, Take 600 mg by mouth at bedtime., Disp: , Rfl:    losartan (COZAAR) 25 MG tablet, Take 25 mg by mouth daily., Disp: , Rfl:    methylPREDNISolone (MEDROL) 4 MG tablet, Take 1 tablet (4 mg total) by mouth every other day. Alternate with the 2mg  dose (Patient taking differently: Take 4 mg by mouth daily.), Disp:  , Rfl:    Nintedanib (OFEV) 100 MG CAPS, Take 1 capsule (100 mg total) by mouth 2 (two) times daily., Disp: 60 capsule, Rfl: 10   ondansetron (ZOFRAN) 4 MG tablet, Take 1 tablet (4 mg total) by mouth every 8 (eight) hours as needed for nausea or vomiting., Disp: 30 tablet, Rfl: 5   pantoprazole (PROTONIX) 40 MG tablet, Take 1 tablet (40 mg total) by mouth 2 (two) times daily before a meal., Disp: 180 tablet, Rfl: 3   pravastatin (PRAVACHOL) 20 MG tablet, Take 1 tablet (20 mg total) by mouth daily., Disp: 90 tablet, Rfl: 1   venlafaxine XR (EFFEXOR-XR) 75 MG 24 hr capsule,  Take 75 mg by mouth at bedtime. , Disp: , Rfl:       Objective:   Vitals:   03/02/23 1549  BP: 120/80  Pulse: 68  SpO2: 94%  Weight: 134 lb 3.2 oz (60.9 kg)  Height: 5\' 5"  (1.651 m)    Estimated body mass index is 22.33 kg/m as calculated from the following:   Height as of this encounter: 5\' 5"  (1.651 m).   Weight as of this encounter: 134 lb 3.2 oz (60.9 kg).  @WEIGHTCHANGE @  [employer]   03/02/23 1549  Weight: 134 lb 3.2 oz (60.9 kg)     Physical Exam   General: No distress. thin O2 at rest: no Cane present: no Sitting in wheel chair: no Frail: yes Obese: no Neuro: Alert and Oriented x 3. GCS 15. Speech normal Psych: Pleasant Resp:  Barrel Chest - no.  Wheeze - no, Crackles - ys, No overt respiratory distress CVS: Normal  heart sounds. Murmurs - n Ext: Stigmata of Connective Tissue Disease - no HEENT: Normal upper airway. PEERL +. No post nasal drip        Assessment:       ICD-10-CM   1. ILD (interstitial lung disease) (HCC)  J84.9 Hepatic function panel    2. Encounter for long-term current use of high risk medication  Z79.899          Plan:     Patient Instructions     ICD-10-CM   1. ILD (interstitial lung disease) (HCC)  J84.9     2. Drug-induced weight loss  R63.4    T50.905A     3. Drug-induced nausea and vomiting  R11.2    T50.905A     4. Encounter for long-term current use of high risk medication  Z79.899        Interstitial lung disease due to connective tissue disease (HCC) Encounter for long-term current use of high risk medication  -Seems you are tolerating Ofev rechallenge now with taking Zofran twice daily on a scheduled basis - PFT slowly worse over time but unclera if worse x 1 year  Plan - continue ofev 100mg  twice daily - can take zofran 4mg  twice daily  - -Continue Rituxan and steroids through the rheumatologist - continuie  breo 100 dosing daily once with albuterol as needed -Check  LFT 03/02/2023  - ILD-PRO  research visit 03/02/2023 - Take consent copy for AVALYN study     Asthma, unspecified asthma severity, unspecified whether complicated, unspecified whether persistent  - stable  Plan  -continue breo scheduled with albuteraol as neede  Grief reaction  - sorry to hear about loss of your husband on September 11, 2022  - glad grief is some better   Followup  -  30 min  visit in 4 months   - symptoms score and walking desaturation test at followup  = can cancel if on research study   FOLLOWUP Return in about 4 months (around 07/02/2023) for 30 min visit, ILD, with Dr Marchelle Gearing, Face to Face Visit.    SIGNATURE    Dr. Kalman Shan, M.D., F.C.C.P,  Pulmonary and Critical Care Medicine Staff Physician, [facility] Director - Interstitial Lung Disease  Program  Pulmonary Fibrosis [facility] at [facility] [place], [place], [address]  Pager: [phone], If no answer or between  15:00h - 7:00h: call [phone] Telephone: [phone]  4:25 PM 03/02/2023

## 2023-03-02 NOTE — Patient Instructions (Addendum)
ICD-10-CM   1. ILD (interstitial lung disease) (HCC)  J84.9     2. Drug-induced weight loss  R63.4    T50.905A     3. Drug-induced nausea and vomiting  R11.2    T50.905A     4. Encounter for long-term current use of high risk medication  Z79.899        Interstitial lung disease due to connective tissue disease (HCC) Encounter for long-term current use of high risk medication  -Seems you are tolerating Ofev rechallenge now with taking Zofran twice daily on a scheduled basis - PFT slowly worse over time but unclera if worse x 1 year  Plan - continue ofev 100mg  twice daily - can take zofran 4mg  twice daily  - -Continue Rituxan and steroids through the rheumatologist - continuie  breo 100 dosing daily once with albuterol as needed -Check  LFT 03/02/2023  - ILD-PRO research visit 03/02/2023 - Take consent copy for AVALYN study     Asthma, unspecified asthma severity, unspecified whether complicated, unspecified whether persistent  - stable  Plan  -continue breo scheduled with albuteraol as neede  Grief reaction  - sorry to hear about loss of your husband on September 11, 2022  - glad grief is some better   Followup  -  30 min  visit in 4 months   - symptoms score and walking desaturation test at followup  = can cancel if on research study

## 2023-03-02 NOTE — Progress Notes (Signed)
Spirometry and Dlco done today. 

## 2023-03-02 NOTE — Research (Signed)
Title: Chronic Fibrosing Interstitial Lung Disease with Progressive Phenotype Prospective Outcomes (ILD-PRO) Registry   Protocol #: IPF-PRO-SUB, Clinical Trials # [id number], Sponsor: [employer]  Protocol Version Amendment 6 dated 30Apr2024  and confirmed current on 03/02/2023 Consent Version for today's visit date of 03/02/2023  is [employer] Approved Version 22May2024 Revised 29Jul2024  Objectives:  Describe current approaches to diagnosis and treatment of chronic fibrosing ILDs with progressive phenotype  Describe the natural history of chronic fibrosing ILDs with progressive phenotype  Assess quality of life from self-administered participant reported questionnaires for each disease group  Describe participant interactions with the healthcare system, describe treatment practices across multiple institutions for each disease group  Collect biological samples linked to well characterized chronic fibrosing ILDs with progressive phenotype to identify disease biomarkers  Collect data and biological samples that will support future research studies.                                            Key Inclusion Criteria: Willing and able to provide informed consent  Age ? 30 years  Diagnosis of a non-IPF ILD of any duration, including, but not limited to Idiopathic Non-Specific Interstitial Pneumonia (INSIP), Unclassifiable Idiopathic Interstitial Pneumonias (IIPs), Interstitial Pneumonia with Autoimmune Features (IPAF), Autoimmune ILDs such as Rheumatoid Arthritis (RA-ILD) and Systemic Sclerosis (SSC-ILD), Chronic Hypersensitivity Pneumonitis (HP), Sarcoidosis or Exposure-related ILDs such as asbestosis.  Chronic fibrosing ILD defined by reticular abnormality with traction bronchiectasis with or without honeycombing confirmed by chest HRCT scan and/or lung biopsy.  Progressive phenotype as defined by fulfilling at least one of the criteria below of fibrotic changes (progression  set point) within the last 24 months regardless of treatment considered appropriate in individual ILDs:  decline in FVC % predicted (% pred) based on >10% relative decline  decline in FVC % pred based on ? 5 - <10% relative decline in FVC combined with worsening of respiratory symptoms as assessed by the site investigator  decline in FVC % pred based on ? 5 - <10% relative decline in FVC combined with increasing extent of fibrotic changes on chest imaging (HRCT scan) as assessed by the site investigator  decline in DLCO % pred based on ? 10% relative decline  worsening of respiratory symptoms as well as increasing extent of fibrotic changes on chest imaging (HRCT scan) as assessed by the site investigator independent of FVC change.    Key Exclusion Criteria: Malignancy, treated or untreated, other than skin or early stage prostate cancer, within the past 5 years  Currently listed for lung transplantation at the time of enrollment  Currently enrolled in a clinical trial at the time of enrollment in this registry     Clinical Research Coordinator / Research RN note : This visit for ALICE ROOP  Subject [id number] with DOB:[birth year]  on 20/Aug/2024  for the above protocol is Visit/Encounter 7 and is for purpose of research.    Subject expressed continued interest and consent in continuing as a study subject. Subject confirmed that there was no change in contact information (e.g. address, telephone, email). Subject thanked for participation in research and contribution to science.     During this visit on 20/Aug/2024, the subject completed the blood work and questionnaires per the above referenced protocol. Please refer to the subject's paper source binder for further details.   Signed by Despina Hick Clinical Research Coordinator I  [employer]  [place], [place] [id number]

## 2023-03-03 LAB — HEPATIC FUNCTION PANEL
ALT: 12 U/L (ref 0–35)
AST: 24 U/L (ref 0–37)
Albumin: 4.1 g/dL (ref 3.5–5.2)
Alkaline Phosphatase: 56 U/L (ref 39–117)
Bilirubin, Direct: 0.1 mg/dL (ref 0.0–0.3)
Total Bilirubin: 0.3 mg/dL (ref 0.2–1.2)
Total Protein: 6.9 g/dL (ref 6.0–8.3)

## 2023-03-05 ENCOUNTER — Ambulatory Visit (HOSPITAL_BASED_OUTPATIENT_CLINIC_OR_DEPARTMENT_OTHER): Payer: Medicare Other | Admitting: Cardiology

## 2023-03-12 ENCOUNTER — Encounter (HOSPITAL_BASED_OUTPATIENT_CLINIC_OR_DEPARTMENT_OTHER): Payer: Self-pay | Admitting: Cardiology

## 2023-03-12 ENCOUNTER — Ambulatory Visit (INDEPENDENT_AMBULATORY_CARE_PROVIDER_SITE_OTHER): Payer: Medicare Other | Admitting: Cardiology

## 2023-03-12 VITALS — BP 120/60 | HR 73 | Ht 65.5 in | Wt 135.2 lb

## 2023-03-12 DIAGNOSIS — I2510 Atherosclerotic heart disease of native coronary artery without angina pectoris: Secondary | ICD-10-CM | POA: Diagnosis not present

## 2023-03-12 DIAGNOSIS — Z7189 Other specified counseling: Secondary | ICD-10-CM | POA: Diagnosis not present

## 2023-03-12 DIAGNOSIS — E7800 Pure hypercholesterolemia, unspecified: Secondary | ICD-10-CM

## 2023-03-12 DIAGNOSIS — I10 Essential (primary) hypertension: Secondary | ICD-10-CM

## 2023-03-12 NOTE — Progress Notes (Signed)
Cardiology Office Note:  .    Date:  03/12/2023  ID:  Stacey [name], Stacey [name] 08-02-1940, MRN [id number] PCP: [name], MD  [facility] Providers Cardiologist:  Jodelle Red, MD     History of Present Illness: Stacey [name]    Stacey [name] is a 82 y.o. 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   Cardiac history: She was admitted 12/2018 for shortness of breath and found to have elevated troponins. We had an extensive conversation during her admission with shared decision making regarding the elevation of her troponin. She preferred medical management over an invasive strategy. She had a rise and fall of troponins, with peak of 2.38 (old assay), but no chest pain. Echo without wall motion abnormalities. She received >48 hours of heparin and was started on aspirin and statin. Coronary calcium was seen on her CT, and we discussed high intensity statin. However, she was concerned about myalgias and preferred pravastatin.    In 07/2021, she complained of lightheadedness and worsening imbalance issues. Her PCP had changed her Losartan to 50 mg. She had not needed to use her Zofran and Senokot medications, or her inhaler.    She had a fall off of the front porch 10/08/21 striking her head and causing a LLE laceration. EMS was called and she was seen in the ED. Troponin negative. CT imaging was reassuring. On 01/01/2022 she had another fall and presented to the ED. She injured her mid back and head, and complained of right-sided thoracic pain. Imaging showed no acute fractures or dislocation. It was suspected that she had thoracic strain and soft tissue bruising. Conservative therapy was recommended.   At her visit 07/2022, she had recovered well from a third COVID infection in December. After taking Ofev for a year, she had developed nausea with each dose. She was on Zofran for her nausea  which helped. Blood pressure was 120/64 in the office, but sometimes as high as the 160's at home (asymptomatic). In 11/2022 she was admitted to the hospital with nausea and vomiting, dehydration.  Today, unfortunately she notes that her husband passed away in 10-05-2022. She is planning to move to [facility] Her main complaints today include shortness of breath and an inability to walk very far. Over she feels her shortness of breath is mostly stable but a little bit worse. She does feel like she is back to her baseline since her hospital admission in May.   She also complains of dizziness and balance issues. Of note, she is on 3-4 medications with side effects of dizziness. She feels this is the main reason why she feels unstable. She has been working with a physical therapist.  Occasionally she will notice some mild swelling in her right ankle. She will elevate her foot with improvement in her swelling in a day or two.  She denies any palpitations, chest pain, headaches, syncope, orthopnea, or PND.  ROS:  Please see the history of present illness. ROS otherwise negative except as noted.  (+) Shortness of breath (+) Exercise intolerance (+) Dizziness (+) Gait instability/imbalance (+) Intermittent LE edema of right ankle  Studies Reviewed: Stacey [name]         Physical Exam:    VS:  BP 120/60   Pulse 73   Ht 5' 5.5" (1.664 m)   Wt 135 lb 3.2 oz (61.3 kg)   SpO2 100%   BMI 22.16  kg/m    Wt Readings from Last 3 Encounters:  03/12/23 135 lb 3.2 oz (61.3 kg)  03/02/23 134 lb 3.2 oz (60.9 kg)  12/11/22 128 lb 3.2 oz (58.2 kg)    GEN: Well nourished, well developed in no acute distress HEENT: Normal, moist mucous membranes NECK: No JVD CARDIAC: regular rhythm, normal S1 and S2, no rubs or gallops. No murmur. VASCULAR: Radial and DP pulses 2+ bilaterally. No carotid bruits RESPIRATORY:  Velcro crackles, without wheezing or rhonchi  ABDOMEN: Soft, non-tender, non-distended MUSCULOSKELETAL:   Ambulates independently SKIN: Warm and dry, no edema NEUROLOGIC:  Alert and oriented x 3. No focal neuro deficits noted. PSYCHIATRIC:  Normal affect   ASSESSMENT AND PLAN: .    Interstitial lung disease, followed by Dr. 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 Limit salt. -recommend moderate walking, 3-5 times/week for 30-50 minutes each session. Aim for at least 150 minutes.week. Goal should be pace of 3 miles/hours, or walking 1.5 miles in 30 minutes -recommend avoidance of tobacco products. Avoid excess alcohol.  Dispo: Follow-up in 1 year, or sooner as needed.  I,Mathew Stumpf,acting as a Neurosurgeon for [employer], MD.,have documented all relevant documentation on the behalf of Jodelle Red, MD,as directed by  Jodelle Red, MD while in the presence of Jodelle Red, MD.  I, Jodelle Red, MD, have reviewed all documentation for this visit. The documentation on 03/12/23 for the exam, diagnosis, procedures, and orders are all accurate and complete.   Signed, Jodelle Red, MD

## 2023-03-12 NOTE — Patient Instructions (Signed)
Medication Instructions:  Your physician recommends that you continue on your current medications as directed. Please refer to the Current Medication list given to you today.  *If you need a refill on your cardiac medications before your next appointment, please call your pharmacy*  Follow-Up: At [facility], you and your health needs are our priority.  As part of our continuing mission to provide you with exceptional heart care, we have created designated Provider Care Teams.  These Care Teams include your primary Cardiologist (physician) and Advanced Practice Providers (APPs -  Physician Assistants and Nurse Practitioners) who all work together to provide you with the care you need, when you need it.  We recommend signing up for the patient portal called "MyChart".  Sign up information is provided on this After Visit Summary.  MyChart is used to connect with patients for Virtual Visits (Telemedicine).  Patients are able to view lab/test results, encounter notes, upcoming appointments, etc.  Non-urgent messages can be sent to your provider as well.   To learn more about what you can do with MyChart, go to [email or web address].    Your next appointment:   1 year(s)  Provider:   Bridgette Christopher, MD    

## 2023-03-13 ENCOUNTER — Other Ambulatory Visit: Payer: Self-pay | Admitting: Internal Medicine

## 2023-04-02 DIAGNOSIS — I872 Venous insufficiency (chronic) (peripheral): Secondary | ICD-10-CM | POA: Diagnosis not present

## 2023-04-02 DIAGNOSIS — D692 Other nonthrombocytopenic purpura: Secondary | ICD-10-CM | POA: Diagnosis not present

## 2023-04-02 DIAGNOSIS — I878 Other specified disorders of veins: Secondary | ICD-10-CM | POA: Diagnosis not present

## 2023-04-16 DIAGNOSIS — I10 Essential (primary) hypertension: Secondary | ICD-10-CM | POA: Diagnosis not present

## 2023-04-16 DIAGNOSIS — I700 Atherosclerosis of aorta: Secondary | ICD-10-CM | POA: Diagnosis not present

## 2023-04-16 DIAGNOSIS — J449 Chronic obstructive pulmonary disease, unspecified: Secondary | ICD-10-CM | POA: Diagnosis not present

## 2023-04-16 DIAGNOSIS — E785 Hyperlipidemia, unspecified: Secondary | ICD-10-CM | POA: Diagnosis not present

## 2023-04-16 DIAGNOSIS — D8989 Other specified disorders involving the immune mechanism, not elsewhere classified: Secondary | ICD-10-CM | POA: Diagnosis not present

## 2023-04-16 DIAGNOSIS — J849 Interstitial pulmonary disease, unspecified: Secondary | ICD-10-CM | POA: Diagnosis not present

## 2023-04-16 DIAGNOSIS — I872 Venous insufficiency (chronic) (peripheral): Secondary | ICD-10-CM | POA: Diagnosis not present

## 2023-04-16 DIAGNOSIS — D692 Other nonthrombocytopenic purpura: Secondary | ICD-10-CM | POA: Diagnosis not present

## 2023-04-16 DIAGNOSIS — G9389 Other specified disorders of brain: Secondary | ICD-10-CM | POA: Diagnosis not present

## 2023-04-16 DIAGNOSIS — F419 Anxiety disorder, unspecified: Secondary | ICD-10-CM | POA: Diagnosis not present

## 2023-04-16 DIAGNOSIS — I771 Stricture of artery: Secondary | ICD-10-CM | POA: Diagnosis not present

## 2023-04-16 DIAGNOSIS — Z23 Encounter for immunization: Secondary | ICD-10-CM | POA: Diagnosis not present

## 2023-04-16 DIAGNOSIS — M0510 Rheumatoid lung disease with rheumatoid arthritis of unspecified site: Secondary | ICD-10-CM | POA: Diagnosis not present

## 2023-04-19 DIAGNOSIS — Z23 Encounter for immunization: Secondary | ICD-10-CM | POA: Diagnosis not present

## 2023-05-05 DIAGNOSIS — H52203 Unspecified astigmatism, bilateral: Secondary | ICD-10-CM | POA: Diagnosis not present

## 2023-05-05 DIAGNOSIS — H26491 Other secondary cataract, right eye: Secondary | ICD-10-CM | POA: Diagnosis not present

## 2023-05-05 DIAGNOSIS — H5000 Unspecified esotropia: Secondary | ICD-10-CM | POA: Diagnosis not present

## 2023-05-05 DIAGNOSIS — Z961 Presence of intraocular lens: Secondary | ICD-10-CM | POA: Diagnosis not present

## 2023-06-21 ENCOUNTER — Inpatient Hospital Stay (HOSPITAL_COMMUNITY)
Admission: EM | Admit: 2023-06-21 | Discharge: 2023-06-25 | DRG: 183 | Disposition: A | Discharge status: Skilled Nursing Facility | Payer: Medicare Other | Attending: Internal Medicine | Admitting: Internal Medicine

## 2023-06-21 ENCOUNTER — Emergency Department (HOSPITAL_COMMUNITY): Payer: Medicare Other

## 2023-06-21 DIAGNOSIS — J849 Interstitial pulmonary disease, unspecified: Secondary | ICD-10-CM | POA: Diagnosis present

## 2023-06-21 DIAGNOSIS — R296 Repeated falls: Secondary | ICD-10-CM | POA: Diagnosis not present

## 2023-06-21 DIAGNOSIS — Z880 Allergy status to penicillin: Secondary | ICD-10-CM

## 2023-06-21 DIAGNOSIS — K219 Gastro-esophageal reflux disease without esophagitis: Secondary | ICD-10-CM | POA: Diagnosis present

## 2023-06-21 DIAGNOSIS — R5381 Other malaise: Secondary | ICD-10-CM | POA: Diagnosis present

## 2023-06-21 DIAGNOSIS — I5042 Chronic combined systolic (congestive) and diastolic (congestive) heart failure: Secondary | ICD-10-CM | POA: Diagnosis present

## 2023-06-21 DIAGNOSIS — I700 Atherosclerosis of aorta: Secondary | ICD-10-CM | POA: Diagnosis not present

## 2023-06-21 DIAGNOSIS — M069 Rheumatoid arthritis, unspecified: Secondary | ICD-10-CM | POA: Diagnosis not present

## 2023-06-21 DIAGNOSIS — J984 Other disorders of lung: Secondary | ICD-10-CM

## 2023-06-21 DIAGNOSIS — S2241XD Multiple fractures of ribs, right side, subsequent encounter for fracture with routine healing: Secondary | ICD-10-CM | POA: Diagnosis not present

## 2023-06-21 DIAGNOSIS — Z803 Family history of malignant neoplasm of breast: Secondary | ICD-10-CM

## 2023-06-21 DIAGNOSIS — K449 Diaphragmatic hernia without obstruction or gangrene: Secondary | ICD-10-CM | POA: Diagnosis not present

## 2023-06-21 DIAGNOSIS — M7989 Other specified soft tissue disorders: Secondary | ICD-10-CM | POA: Diagnosis not present

## 2023-06-21 DIAGNOSIS — Z9049 Acquired absence of other specified parts of digestive tract: Secondary | ICD-10-CM

## 2023-06-21 DIAGNOSIS — F329 Major depressive disorder, single episode, unspecified: Secondary | ICD-10-CM | POA: Diagnosis not present

## 2023-06-21 DIAGNOSIS — R278 Other lack of coordination: Secondary | ICD-10-CM | POA: Diagnosis not present

## 2023-06-21 DIAGNOSIS — Y92009 Unspecified place in unspecified non-institutional (private) residence as the place of occurrence of the external cause: Secondary | ICD-10-CM

## 2023-06-21 DIAGNOSIS — S2231XA Fracture of one rib, right side, initial encounter for closed fracture: Secondary | ICD-10-CM | POA: Diagnosis not present

## 2023-06-21 DIAGNOSIS — Z6821 Body mass index (BMI) 21.0-21.9, adult: Secondary | ICD-10-CM | POA: Diagnosis not present

## 2023-06-21 DIAGNOSIS — Z79899 Other long term (current) drug therapy: Secondary | ICD-10-CM

## 2023-06-21 DIAGNOSIS — Z888 Allergy status to other drugs, medicaments and biological substances status: Secondary | ICD-10-CM | POA: Diagnosis not present

## 2023-06-21 DIAGNOSIS — Z825 Family history of asthma and other chronic lower respiratory diseases: Secondary | ICD-10-CM

## 2023-06-21 DIAGNOSIS — J9601 Acute respiratory failure with hypoxia: Secondary | ICD-10-CM | POA: Diagnosis present

## 2023-06-21 DIAGNOSIS — N179 Acute kidney failure, unspecified: Secondary | ICD-10-CM | POA: Diagnosis present

## 2023-06-21 DIAGNOSIS — M6281 Muscle weakness (generalized): Secondary | ICD-10-CM | POA: Diagnosis not present

## 2023-06-21 DIAGNOSIS — I10 Essential (primary) hypertension: Secondary | ICD-10-CM | POA: Diagnosis present

## 2023-06-21 DIAGNOSIS — S2231XD Fracture of one rib, right side, subsequent encounter for fracture with routine healing: Secondary | ICD-10-CM | POA: Diagnosis not present

## 2023-06-21 DIAGNOSIS — R609 Edema, unspecified: Secondary | ICD-10-CM | POA: Diagnosis not present

## 2023-06-21 DIAGNOSIS — W1811XA Fall from or off toilet without subsequent striking against object, initial encounter: Secondary | ICD-10-CM | POA: Diagnosis present

## 2023-06-21 DIAGNOSIS — J9621 Acute and chronic respiratory failure with hypoxia: Secondary | ICD-10-CM | POA: Diagnosis not present

## 2023-06-21 DIAGNOSIS — J45909 Unspecified asthma, uncomplicated: Secondary | ICD-10-CM | POA: Diagnosis present

## 2023-06-21 DIAGNOSIS — R0781 Pleurodynia: Secondary | ICD-10-CM | POA: Diagnosis not present

## 2023-06-21 DIAGNOSIS — E43 Unspecified severe protein-calorie malnutrition: Secondary | ICD-10-CM | POA: Diagnosis not present

## 2023-06-21 DIAGNOSIS — E785 Hyperlipidemia, unspecified: Secondary | ICD-10-CM | POA: Diagnosis not present

## 2023-06-21 DIAGNOSIS — W19XXXA Unspecified fall, initial encounter: Secondary | ICD-10-CM

## 2023-06-21 DIAGNOSIS — I6381 Other cerebral infarction due to occlusion or stenosis of small artery: Secondary | ICD-10-CM | POA: Diagnosis not present

## 2023-06-21 DIAGNOSIS — R059 Cough, unspecified: Secondary | ICD-10-CM | POA: Diagnosis not present

## 2023-06-21 DIAGNOSIS — S2249XA Multiple fractures of ribs, unspecified side, initial encounter for closed fracture: Secondary | ICD-10-CM | POA: Diagnosis not present

## 2023-06-21 DIAGNOSIS — S2239XA Fracture of one rib, unspecified side, initial encounter for closed fracture: Secondary | ICD-10-CM | POA: Diagnosis present

## 2023-06-21 DIAGNOSIS — Y92002 Bathroom of unspecified non-institutional (private) residence single-family (private) house as the place of occurrence of the external cause: Secondary | ICD-10-CM | POA: Diagnosis not present

## 2023-06-21 DIAGNOSIS — S0990XA Unspecified injury of head, initial encounter: Secondary | ICD-10-CM | POA: Diagnosis not present

## 2023-06-21 DIAGNOSIS — K5900 Constipation, unspecified: Secondary | ICD-10-CM | POA: Diagnosis present

## 2023-06-21 DIAGNOSIS — R2681 Unsteadiness on feet: Secondary | ICD-10-CM | POA: Diagnosis not present

## 2023-06-21 DIAGNOSIS — I959 Hypotension, unspecified: Secondary | ICD-10-CM | POA: Diagnosis not present

## 2023-06-21 DIAGNOSIS — S2241XA Multiple fractures of ribs, right side, initial encounter for closed fracture: Principal | ICD-10-CM | POA: Diagnosis present

## 2023-06-21 LAB — I-STAT CHEM 8, ED
BUN: 14 mg/dL (ref 8–23)
Calcium, Ion: 1.13 mmol/L — ABNORMAL LOW (ref 1.15–1.40)
Chloride: 100 mmol/L (ref 98–111)
Creatinine, Ser: 1.1 mg/dL — ABNORMAL HIGH (ref 0.44–1.00)
Glucose, Bld: 101 mg/dL — ABNORMAL HIGH (ref 70–99)
HCT: 38 % (ref 36.0–46.0)
Hemoglobin: 12.9 g/dL (ref 12.0–15.0)
Potassium: 4.2 mmol/L (ref 3.5–5.1)
Sodium: 137 mmol/L (ref 135–145)
TCO2: 28 mmol/L (ref 22–32)

## 2023-06-21 LAB — CBC WITH DIFFERENTIAL/PLATELET
Abs Immature Granulocytes: 0.02 10*3/uL (ref 0.00–0.07)
Basophils Absolute: 0.1 10*3/uL (ref 0.0–0.1)
Basophils Relative: 1 %
Eosinophils Absolute: 0.3 10*3/uL (ref 0.0–0.5)
Eosinophils Relative: 3 %
HCT: 39 % (ref 36.0–46.0)
Hemoglobin: 12.3 g/dL (ref 12.0–15.0)
Immature Granulocytes: 0 %
Lymphocytes Relative: 21 %
Lymphs Abs: 2.1 10*3/uL (ref 0.7–4.0)
MCH: 29.9 pg (ref 26.0–34.0)
MCHC: 31.5 g/dL (ref 30.0–36.0)
MCV: 94.9 fL (ref 80.0–100.0)
Monocytes Absolute: 1.3 10*3/uL — ABNORMAL HIGH (ref 0.1–1.0)
Monocytes Relative: 13 %
Neutro Abs: 6.2 10*3/uL (ref 1.7–7.7)
Neutrophils Relative %: 62 %
Platelets: 266 10*3/uL (ref 150–400)
RBC: 4.11 MIL/uL (ref 3.87–5.11)
RDW: 13.8 % (ref 11.5–15.5)
WBC: 9.9 10*3/uL (ref 4.0–10.5)
nRBC: 0 % (ref 0.0–0.2)

## 2023-06-21 LAB — CK: Total CK: 105 U/L (ref 38–234)

## 2023-06-21 LAB — COMPREHENSIVE METABOLIC PANEL
ALT: 14 U/L (ref 0–44)
AST: 24 U/L (ref 15–41)
Albumin: 3.3 g/dL — ABNORMAL LOW (ref 3.5–5.0)
Alkaline Phosphatase: 53 U/L (ref 38–126)
Anion gap: 8 (ref 5–15)
BUN: 12 mg/dL (ref 8–23)
CO2: 27 mmol/L (ref 22–32)
Calcium: 8.9 mg/dL (ref 8.9–10.3)
Chloride: 103 mmol/L (ref 98–111)
Creatinine, Ser: 1.32 mg/dL — ABNORMAL HIGH (ref 0.44–1.00)
GFR, Estimated: 40 mL/min — ABNORMAL LOW (ref >60)
Glucose, Bld: 103 mg/dL — ABNORMAL HIGH (ref 70–99)
Potassium: 4.2 mmol/L (ref 3.5–5.1)
Sodium: 138 mmol/L (ref 135–145)
Total Bilirubin: 0.6 mg/dL (ref <1.2)
Total Protein: 6.2 g/dL — ABNORMAL LOW (ref 6.5–8.1)

## 2023-06-21 LAB — MAGNESIUM: Magnesium: 1.9 mg/dL (ref 1.7–2.4)

## 2023-06-21 LAB — PHOSPHORUS: Phosphorus: 4.1 mg/dL (ref 2.5–4.6)

## 2023-06-21 LAB — TSH: TSH: 4.113 u[IU]/mL (ref 0.350–4.500)

## 2023-06-21 LAB — OSMOLALITY: Osmolality: 300 mosm/kg — ABNORMAL HIGH (ref 275–295)

## 2023-06-21 MED ORDER — METHYLPREDNISOLONE 4 MG PO TABS
4.0000 mg | ORAL_TABLET | ORAL | Status: DC
  Administered 2023-06-22 – 2023-06-24 (×2): 4 mg via ORAL
  Filled 2023-06-21 (×2): qty 1

## 2023-06-21 MED ORDER — SULFAMETHOXAZOLE-TRIMETHOPRIM 800-160 MG PO TABS
1.0000 | ORAL_TABLET | Freq: Two times a day (BID) | ORAL | Status: DC
Start: 2023-06-21 — End: 2023-06-22
  Filled 2023-06-21: qty 1

## 2023-06-21 MED ORDER — ONDANSETRON HCL 4 MG/2ML IJ SOLN: 4.0000 mg | Freq: Four times a day (QID) | INTRAMUSCULAR | Status: DC | PRN

## 2023-06-21 MED ORDER — POLYETHYLENE GLYCOL 3350 17 G PO PACK
17.0000 g | PACK | Freq: Every day | ORAL | Status: DC | PRN
  Administered 2023-06-22 – 2023-06-23 (×2): 17 g via ORAL
  Filled 2023-06-21 (×3): qty 1

## 2023-06-21 MED ORDER — METHYLPREDNISOLONE 2 MG PO TABS: 2.0000 mg | ORAL_TABLET | ORAL | Status: DC

## 2023-06-21 MED ORDER — DOCUSATE SODIUM 100 MG PO CAPS
100.0000 mg | ORAL_CAPSULE | Freq: Two times a day (BID) | ORAL | Status: DC
  Administered 2023-06-21 – 2023-06-25 (×8): 100 mg via ORAL
  Filled 2023-06-21 (×8): qty 1

## 2023-06-21 MED ORDER — ONDANSETRON 4 MG PO TBDP
4.0000 mg | ORAL_TABLET | Freq: Once | ORAL | Status: AC
  Administered 2023-06-21: 4 mg via ORAL
  Filled 2023-06-21: qty 1

## 2023-06-21 MED ORDER — IPRATROPIUM-ALBUTEROL 0.5-2.5 (3) MG/3ML IN SOLN
3.0000 mL | Freq: Four times a day (QID) | RESPIRATORY_TRACT | Status: DC
  Administered 2023-06-22 (×2): 3 mL via RESPIRATORY_TRACT
  Filled 2023-06-21 (×2): qty 3

## 2023-06-21 MED ORDER — FENTANYL CITRATE PF 50 MCG/ML IJ SOSY: 12.5000 ug | PREFILLED_SYRINGE | INTRAMUSCULAR | Status: DC | PRN

## 2023-06-21 MED ORDER — METHOCARBAMOL 1000 MG/10ML IJ SOLN: 500.0000 mg | Freq: Four times a day (QID) | INTRAMUSCULAR | Status: DC | PRN

## 2023-06-21 MED ORDER — MILK AND MOLASSES ENEMA
1.0000 | Freq: Once | RECTAL | Status: DC
  Filled 2023-06-21: qty 240

## 2023-06-21 MED ORDER — ALBUTEROL SULFATE (2.5 MG/3ML) 0.083% IN NEBU
2.5000 mg | INHALATION_SOLUTION | RESPIRATORY_TRACT | Status: DC | PRN
  Administered 2023-06-21: 2.5 mg via RESPIRATORY_TRACT
  Filled 2023-06-21: qty 3

## 2023-06-21 MED ORDER — SODIUM CHLORIDE 0.9 % IV SOLN: INTRAVENOUS | Status: AC

## 2023-06-21 MED ORDER — BISACODYL 10 MG RE SUPP
10.0000 mg | Freq: Every day | RECTAL | Status: DC | PRN
  Administered 2023-06-22: 10 mg via RECTAL
  Filled 2023-06-21: qty 1

## 2023-06-21 MED ORDER — ONDANSETRON HCL 4 MG PO TABS: 4.0000 mg | ORAL_TABLET | Freq: Four times a day (QID) | ORAL | Status: DC | PRN

## 2023-06-21 MED ORDER — PRAVASTATIN SODIUM 10 MG PO TABS
20.0000 mg | ORAL_TABLET | Freq: Every day | ORAL | Status: DC
  Administered 2023-06-21 – 2023-06-25 (×5): 20 mg via ORAL
  Filled 2023-06-21 (×5): qty 2

## 2023-06-21 MED ORDER — GUAIFENESIN ER 600 MG PO TB12
600.0000 mg | ORAL_TABLET | Freq: Two times a day (BID) | ORAL | Status: DC
  Administered 2023-06-21 – 2023-06-25 (×8): 600 mg via ORAL
  Filled 2023-06-21 (×8): qty 1

## 2023-06-21 MED ORDER — ACETAMINOPHEN 325 MG PO TABS: 650.0000 mg | ORAL_TABLET | Freq: Four times a day (QID) | ORAL | Status: DC | PRN

## 2023-06-21 MED ORDER — VENLAFAXINE HCL ER 75 MG PO CP24
75.0000 mg | ORAL_CAPSULE | Freq: Every day | ORAL | Status: DC
  Administered 2023-06-21 – 2023-06-24 (×4): 75 mg via ORAL
  Filled 2023-06-21 (×4): qty 1

## 2023-06-21 MED ORDER — SENNA 8.6 MG PO TABS
1.0000 | ORAL_TABLET | Freq: Two times a day (BID) | ORAL | Status: DC
  Administered 2023-06-21 – 2023-06-25 (×8): 8.6 mg via ORAL
  Filled 2023-06-21 (×8): qty 1

## 2023-06-21 MED ORDER — ACETAMINOPHEN 650 MG RE SUPP: 650.0000 mg | Freq: Four times a day (QID) | RECTAL | Status: DC | PRN

## 2023-06-21 MED ORDER — IOHEXOL 350 MG/ML SOLN
60.0000 mL | Freq: Once | INTRAVENOUS | Status: AC | PRN
  Administered 2023-06-21: 60 mL via INTRAVENOUS

## 2023-06-21 MED ORDER — HYDROCODONE-ACETAMINOPHEN 5-325 MG PO TABS
1.0000 | ORAL_TABLET | ORAL | Status: DC | PRN
  Administered 2023-06-21 – 2023-06-22 (×2): 2 via ORAL
  Administered 2023-06-22: 1 via ORAL
  Filled 2023-06-21 (×3): qty 2

## 2023-06-21 MED ORDER — METHYLPREDNISOLONE 4 MG PO TABS
2.0000 mg | ORAL_TABLET | ORAL | Status: DC
  Administered 2023-06-23 – 2023-06-25 (×2): 2 mg via ORAL
  Filled 2023-06-21 (×4): qty 1

## 2023-06-21 MED ORDER — ACETAMINOPHEN 500 MG PO TABS
1000.0000 mg | ORAL_TABLET | Freq: Once | ORAL | Status: AC
  Administered 2023-06-21: 1000 mg via ORAL
  Filled 2023-06-21: qty 2

## 2023-06-21 MED ORDER — OXYCODONE HCL 5 MG PO TABS
5.0000 mg | ORAL_TABLET | Freq: Once | ORAL | Status: AC
  Administered 2023-06-21: 5 mg via ORAL
  Filled 2023-06-21: qty 1

## 2023-06-21 MED ORDER — GABAPENTIN 300 MG PO CAPS
600.0000 mg | ORAL_CAPSULE | Freq: Every day | ORAL | Status: DC
  Administered 2023-06-21 – 2023-06-24 (×4): 600 mg via ORAL
  Filled 2023-06-21 (×4): qty 2

## 2023-06-21 MED ORDER — OXYCODONE-ACETAMINOPHEN 5-325 MG PO TABS: 1.0000 | ORAL_TABLET | Freq: Four times a day (QID) | ORAL | 0 refills | Status: DC | PRN

## 2023-06-21 MED ORDER — AMITRIPTYLINE HCL 50 MG PO TABS
25.0000 mg | ORAL_TABLET | Freq: Every day | ORAL | Status: DC
  Administered 2023-06-21 – 2023-06-24 (×4): 25 mg via ORAL
  Filled 2023-06-21 (×4): qty 1

## 2023-06-21 NOTE — Assessment & Plan Note (Signed)
Incentive spirometry pain management check pulse ox Monitor for evidence of hypoxia

## 2023-06-21 NOTE — Assessment & Plan Note (Signed)
Severe constipation Will order bowel regimen May benefit from an enema

## 2023-06-21 NOTE — Assessment & Plan Note (Addendum)
Obtain your electrolytes and rehydrate Of note pt severely constipated with decreased po intake  Also has been on Bactrim that can artificially increase cr

## 2023-06-21 NOTE — ED Notes (Signed)
ED TO INPATIENT HANDOFF REPORT  ED Nurse Name and Phone #: Alycia Rossetti [phone]  S Name/Age/Gender Stacey [name] 82 y.o. female Room/Bed: H016C/H016C  Code Status   Code Status: Full Code  Home/SNF/Other Rehab Patient oriented to: self, place, time, and situation Is this baseline? Yes   Triage Complete: Triage complete  Chief Complaint Multiple rib fractures [S22.49XA]  Triage Note Right rib pain for a few days after a fall. Hx of interstitial lung disease. EMS states initial O2 at [facility] was 86%. O2 at 2LPM [place] initiated. Alert and oriented x 4.    Allergies Allergies  Allergen Reactions   Penicillins Other (See Comments)    Unknown reaction   Remicade [Infliximab] Other (See Comments)    Unknown reaction    Level of Care/Admitting Diagnosis ED Disposition     ED Disposition  Admit   Condition  --   Comment  Hospital Area: [facility] [[id number]]  Level of Care: Telemetry Medical [104]  May place patient in observation at [facility] or [facility] if equivalent level of care is available:: No  Covid Evaluation: Asymptomatic - no recent exposure (last 10 days) testing not required  Diagnosis: Multiple rib fractures [147829]  Admitting Physician: Therisa Doyne [[id number]]  Attending Physician: Therisa Doyne [[id number]]          B Medical/Surgery History Past Medical History:  Diagnosis Date   Abnormal finding of blood chemistry    Asthma    H/O measles    H/O varicella    Hypertension    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HCC)    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   Lichen sclerosus 05/26/06   Asymptomatic   Low iron    Mitral valve prolapse    Osteoarthritis    Osteoporosis    Pneumonia    Post herpetic neuralgia    Rheumatoid arthritis(714.0)    Yeast infection    Past Surgical History:  Procedure Laterality Date   CHOLECYSTECTOMY  2011   IR ANGIO INTRA EXTRACRAN SEL COM CAROTID INNOMINATE BILAT MOD SED  06/18/2020   IR ANGIO VERTEBRAL SEL  SUBCLAVIAN INNOMINATE UNI R MOD SED  06/18/2020   IR ANGIO VERTEBRAL SEL VERTEBRAL UNI L MOD SED  06/18/2020   WISDOM TOOTH EXTRACTION       A IV Location/Drains/Wounds Patient Lines/Drains/Airways Status     Active Line/Drains/Airways     Name Placement date Placement time Site Days   Peripheral IV 06/21/23 20 G Right Antecubital 06/21/23  1029  Antecubital  less than 1            Intake/Output Last 24 hours No intake or output data in the 24 hours ending 06/21/23 2157  Labs/Imaging Results for orders placed or performed during the hospital encounter of 06/21/23 (from the past 48 hour(s))  I-stat chem 8, ED (not at [facility] or [facility])     Status: Abnormal   Collection Time: 06/21/23 10:39 AM  Result Value Ref Range   Sodium 137 135 - 145 mmol/L   Potassium 4.2 3.5 - 5.1 mmol/L   Chloride 100 98 - 111 mmol/L   BUN 14 8 - 23 mg/dL   Creatinine, Ser 5.62 (H) 0.44 - 1.00 mg/dL   Glucose, Bld 130 (H) 70 - 99 mg/dL    Comment: Glucose reference range applies only to samples taken after fasting for at least 8 hours.   Calcium, Ion 1.13 (L) 1.15 - 1.40 mmol/L   TCO2 28 22 - 32 mmol/L  Hemoglobin 12.9 12.0 - 15.0 g/dL   HCT 46.2 70.3 - 50.0 %  CBC with Differential     Status: Abnormal   Collection Time: 06/21/23 10:47 AM  Result Value Ref Range   WBC 9.9 4.0 - 10.5 K/uL   RBC 4.11 3.87 - 5.11 MIL/uL   Hemoglobin 12.3 12.0 - 15.0 g/dL   HCT 93.8 18.2 - 99.3 %   MCV 94.9 80.0 - 100.0 fL   MCH 29.9 26.0 - 34.0 pg   MCHC 31.5 30.0 - 36.0 g/dL   RDW 71.6 96.7 - 89.3 %   Platelets 266 150 - 400 K/uL   nRBC 0.0 0.0 - 0.2 %   Neutrophils Relative % 62 %   Neutro Abs 6.2 1.7 - 7.7 K/uL   Lymphocytes Relative 21 %   Lymphs Abs 2.1 0.7 - 4.0 K/uL   Monocytes Relative 13 %   Monocytes Absolute 1.3 (H) 0.1 - 1.0 K/uL   Eosinophils Relative 3 %   Eosinophils Absolute 0.3 0.0 - 0.5 K/uL   Basophils Relative 1 %   Basophils Absolute 0.1 0.0 - 0.1 K/uL   Immature Granulocytes 0 %    Abs Immature Granulocytes 0.02 0.00 - 0.07 K/uL    Comment: Performed at [facility], [address] [place], [place] [address]  Comprehensive metabolic panel     Status: Abnormal   Collection Time: 06/21/23 10:47 AM  Result Value Ref Range   Sodium 138 135 - 145 mmol/L   Potassium 4.2 3.5 - 5.1 mmol/L   Chloride 103 98 - 111 mmol/L   CO2 27 22 - 32 mmol/L   Glucose, Bld 103 (H) 70 - 99 mg/dL    Comment: Glucose reference range applies only to samples taken after fasting for at least 8 hours.   BUN 12 8 - 23 mg/dL   Creatinine, Ser 5.10 (H) 0.44 - 1.00 mg/dL   Calcium 8.9 8.9 - 25.8 mg/dL   Total Protein 6.2 (L) 6.5 - 8.1 g/dL   Albumin 3.3 (L) 3.5 - 5.0 g/dL   AST 24 15 - 41 U/L   ALT 14 0 - 44 U/L   Alkaline Phosphatase 53 38 - 126 U/L   Total Bilirubin 0.6 <1.2 mg/dL   GFR, Estimated 40 (L) >60 mL/min    Comment: (NOTE) Calculated using the CKD-EPI Creatinine Equation (2021)    Anion gap 8 5 - 15    Comment: Performed at [facility], [address] [place], [place] [address]  CK     Status: None   Collection Time: 06/21/23 10:47 AM  Result Value Ref Range   Total CK 105 38 - 234 U/L    Comment: Performed at [facility], [address] [place], [place] [address]  Magnesium     Status: None   Collection Time: 06/21/23 10:47 AM  Result Value Ref Range   Magnesium 1.9 1.7 - 2.4 mg/dL    Comment: Performed at [facility], [address], [place], [place] [address]  Phosphorus     Status: None   Collection Time: 06/21/23 10:47 AM  Result Value Ref Range   Phosphorus 4.1 2.5 - 4.6 mg/dL    Comment: Performed at [facility], [address] [place], [place] [address]  Osmolality     Status: Abnormal   Collection Time: 06/21/23 10:47 AM  Result Value Ref Range   Osmolality 300 (H) 275 - 295 mOsm/kg  Comment: Performed at [facility], [address] [place], [place] [address]   CT Head Wo Contrast  Result Date:  06/21/2023 CLINICAL DATA:  Head trauma, minor (Age >= 65y).  Recent fall. EXAM: CT HEAD WITHOUT CONTRAST TECHNIQUE: Contiguous axial images were obtained from the base of the skull through the vertex without intravenous contrast. RADIATION DOSE REDUCTION: This exam was performed according to the departmental dose-optimization program which includes automated exposure control, adjustment of the mA and/or kV according to patient size and/or use of iterative reconstruction technique. COMPARISON:  Head CT 01/01/2022 FINDINGS: Brain: There is no evidence of an acute infarct, intracranial hemorrhage, mass, midline shift, or extra-axial fluid collection. A lacunar infarct in the left caudate head is new but chronic in appearance. Central predominant cerebral atrophy is unchanged. Vascular: No hyperdense vessel. Skull: No acute fracture or suspicious osseous lesion. Sinuses/Orbits: Minimal mucosal thickening in the right frontal and ethmoid sinuses. Clear mastoid air cells. Bilateral cataract extraction. Other: None. IMPRESSION: 1. No evidence of acute intracranial abnormality. 2. Interval chronic left caudate lacunar infarct. 3. Cerebral atrophy. Electronically Signed   By: Alcide Clever M.D.   On: 06/21/2023 20:17   CT CHEST ABDOMEN PELVIS W CONTRAST  Result Date: 06/21/2023 CLINICAL DATA:  Right rib pain after fall several days ago. EXAM: CT CHEST, ABDOMEN, AND PELVIS WITH CONTRAST TECHNIQUE: Multidetector CT imaging of the chest, abdomen and pelvis was performed following the standard protocol during bolus administration of intravenous contrast. RADIATION DOSE REDUCTION: This exam was performed according to the departmental dose-optimization program which includes automated exposure control, adjustment of the mA and/or kV according to patient size and/or use of iterative reconstruction technique. CONTRAST:  60mL OMNIPAQUE IOHEXOL 350 MG/ML SOLN COMPARISON:  Nov 17, 2022.  March 04, 2020. FINDINGS: CT CHEST FINDINGS  Cardiovascular: Atherosclerosis of thoracic aorta is noted without aneurysm or dissection. Mild coronary calcifications are noted. Normal cardiac size. No pericardial effusion. Mediastinum/Nodes: Small sliding-type hiatal hernia. No adenopathy. Thyroid gland is unremarkable. Lungs/Pleura: No pneumothorax or pleural effusion is noted. Stable bilateral interstitial opacities are noted as well as biapical scarring consistent with interstitial lung disease. No acute pulmonary abnormality is noted. Musculoskeletal: Minimally displaced fractures are seen involving the right ninth and tenth ribs. CT ABDOMEN PELVIS FINDINGS Hepatobiliary: No focal liver abnormality is seen. Status post cholecystectomy. No biliary dilatation. Pancreas: Unremarkable. No pancreatic ductal dilatation or surrounding inflammatory changes. Spleen: Normal in size without focal abnormality. Adrenals/Urinary Tract: Adrenal glands are unremarkable. Kidneys are normal, without renal calculi, focal lesion, or hydronephrosis. Bladder is unremarkable. Stomach/Bowel: The stomach is unremarkable. There is no evidence of bowel obstruction or inflammation. Large amount of stool is noted in the right and transverse colon. Appendix is not clearly visualized. Vascular/Lymphatic: Aortic atherosclerosis. No enlarged abdominal or pelvic lymph nodes. Reproductive: Uterus and bilateral adnexa are unremarkable. Other: No abdominal wall hernia or abnormality. No abdominopelvic ascites. Musculoskeletal: No acute or significant osseous findings. IMPRESSION: Minimally displaced right ninth and tenth rib fractures. Stable bilateral pulmonary findings are noted consistent with biapical scarring and chronic interstitial lung disease. Small sliding-type hiatal hernia. Large amount of stool seen in the colon. Mild coronary artery calcifications are noted. Aortic Atherosclerosis (ICD10-I70.0). Electronically Signed   By: Lupita Raider M.D.   On: 06/21/2023 12:46   DG Ribs  Unilateral W/Chest Right  Result Date: 06/21/2023 CLINICAL DATA:  Fall, rib pain. Large volume stool in the RIGHT colon. EXAM: RIGHT RIBS AND CHEST - 3+ VIEW COMPARISON:  None Available. FINDINGS: Nondisplaced fracture of the RIGHT lateral ninth rib. No pneumothorax or pleural fluid identified. Chronic bronchitic markings in the lungs. Atherosclerotic calcification of the aorta. IMPRESSION: 1. Nondisplaced fracture of the RIGHT lateral ninth rib. 2. No pneumothorax. Electronically Signed   By: Genevive Bi M.D.   On: 06/21/2023 12:10    Pending Labs Unresulted Labs (From admission, onward)     Start     Ordered   06/22/23 0500  Prealbumin  Tomorrow morning,   R        06/21/23 2111   06/22/23 0500  Magnesium  Tomorrow morning,   R        06/21/23 2137   06/22/23 0500  Phosphorus  Tomorrow morning,   R        06/21/23 2137   06/22/23 0500  Comprehensive metabolic panel  Tomorrow morning,   R       Question:  Release to patient  Answer:  Immediate   06/21/23 2137   06/22/23 0500  CBC  Tomorrow morning,   R       Question:  Release to patient  Answer:  Immediate   06/21/23 2137   06/21/23 2112  Osmolality, urine  Once,   R        06/21/23 2111   06/21/23 2112  Creatinine, urine, random  Once,   R        06/21/23 2111   06/21/23 2112  Sodium, urine, random  Once,   R        06/21/23 2111   06/21/23 2112  TSH  Add-on,   AD        06/21/23 2111            Vitals/Pain Today's Vitals   06/21/23 1033 06/21/23 1406 06/21/23 1821 06/21/23 1905  BP:  137/66  (!) 151/73  Pulse:  78  75  Resp:  15  16  Temp:  98.2 F (36.8 C)  (!) 97.3 F (36.3 C)  TempSrc:    Oral  SpO2:  90%  98%  Weight:      Height:      PainSc: 10-Worst pain ever  9      Isolation Precautions No active isolations  Medications Medications  amitriptyline (ELAVIL) tablet 25 mg (has no administration in time range)  gabapentin (NEURONTIN) capsule 600 mg (has no administration in time range)   methylPREDNISolone (MEDROL) tablet 2-4 mg (has no administration in time range)  pravastatin (PRAVACHOL) tablet 20 mg (has no administration in time range)  sulfamethoxazole-trimethoprim (BACTRIM DS) 800-160 MG per tablet 1 tablet (has no administration in time range)  venlafaxine XR (EFFEXOR-XR) 24 hr capsule 75 mg (has no administration in time range)  0.9 %  sodium chloride infusion (has no administration in time range)  acetaminophen (TYLENOL) tablet 650 mg (has no administration in time range)    Or  acetaminophen (TYLENOL) suppository 650 mg (has no administration in time range)  HYDROcodone-acetaminophen (NORCO/VICODIN) 5-325 MG per tablet 1-2 tablet (has no administration in time range)  ondansetron (ZOFRAN) tablet 4 mg (has no administration in time range)    Or  ondansetron (ZOFRAN) injection 4 mg (has no administration in time range)  fentaNYL (SUBLIMAZE) injection 12.5-50 mcg (has no administration in time range)  methocarbamol (ROBAXIN) injection 500 mg (has no administration in time range)  docusate sodium (COLACE) capsule 100 mg (has no administration in time range)  senna (SENOKOT) tablet 8.6 mg (has no administration in time range)  polyethylene glycol (MIRALAX / GLYCOLAX) packet 17 g (has no administration in time range)  bisacodyl (DULCOLAX) suppository 10 mg (has no administration in time range)  albuterol (PROVENTIL) (2.5 MG/3ML) 0.083% nebulizer solution 2.5 mg (has no administration in time range)  guaiFENesin (MUCINEX) 12 hr tablet 600 mg (has no administration in time range)  milk and molasses enema (has no administration in time range)  acetaminophen (TYLENOL) tablet 1,000 mg (1,000 mg Oral Given 06/21/23 1032)  oxyCODONE (Oxy IR/ROXICODONE) immediate release tablet 5 mg (5 mg Oral Given 06/21/23 1032)  iohexol (OMNIPAQUE) 350 MG/ML injection 60 mL (60 mLs Intravenous Contrast Given 06/21/23 1147)  oxyCODONE (Oxy IR/ROXICODONE) immediate release tablet 5 mg (5 mg Oral  Given 06/21/23 1821)  ondansetron (ZOFRAN-ODT) disintegrating tablet 4 mg (4 mg Oral Given 06/21/23 2026)    Mobility walks with person assist     Focused Assessments    R Recommendations: See Admitting Provider Note  Report given to:   Additional Notes: Pleasant patient, alert and oriented.

## 2023-06-21 NOTE — ED Notes (Signed)
Pt was unable to tolerate being off of 2l [place] oxygen, her SpO2 was 89% on room air

## 2023-06-21 NOTE — ED Provider Triage Note (Signed)
Emergency Medicine Provider Triage Evaluation Note  Stacey [name] , a 82 y.o. female  was evaluated in triage.  Pt complains of right flank pain after a fall about 4 days ago.  She has had difficulty breathing and pain with coughing.  Was too weak to get out of bed today and so was brought here for evaluation.  Not on oxygen at baseline.  Has a history of interstitial lung disease.  She also has bruising to her face but denies an obvious injury to her head..  Review of Systems  Positive: Right flank pain difficulty breathing Negative: Neck pain confusion abdominal pain  Physical Exam  BP 124/67 (BP Location: Right Arm)   Pulse 85   Temp 98.1 F (36.7 C) (Oral)   Resp 15   Ht 5\' 6"  (1.676 m)   Wt 59 kg   SpO2 100%   BMI 20.98 kg/m  Gen:   Awake, no distress   Resp:  Normal effort  MSK:   Moves extremities without difficulty  Other:  Patient has some bruising along the right flank below the costal margin.  Medical Decision Making  Medically screening exam initiated at 10:27 AM.  Appropriate orders placed.  MARSHAI OKUNO was informed that the remainder of the evaluation will be completed by another provider, this initial triage assessment does not replace that evaluation, and the importance of remaining in the ED until their evaluation is complete.  82 year old female requiring oxygen.  Not on oxygen at baseline.  Status post fall from standing about 4 days ago.  Suspect the patient likely needs admission.  I think less likely to be an acute pneumothorax with the timeline.  Will obtain a plain film of the chest CT chest abdomen pelvis CT of the head.  Blood work.   Melene Plan, DO 06/21/23 1028

## 2023-06-21 NOTE — Assessment & Plan Note (Signed)
Follow-up as an outpatient by pulmonology

## 2023-06-21 NOTE — Assessment & Plan Note (Signed)
Will need PT OT assessment prior to discharge possible placement patient has been on the list awaiting placement while home

## 2023-06-21 NOTE — Discharge Instructions (Addendum)
You were seen here today for a fall.  Your workup showed 2 broken ribs on your right side, the 9th and 10th ribs.  Please use percocet as needed for pain and your incentive spirometer once per hour until you see your PCP.  Please follow-up with your PCP.  Please return to the emergency department for chest pain, shortness of breath, severe vomiting or inability to keep down food, loss of consciousness, or any worsening symptom or concern.

## 2023-06-21 NOTE — ED Provider Notes (Signed)
  Physical Exam  BP 137/66   Pulse 78   Temp 98.2 F (36.8 C)   Resp 15   Ht 5\' 6"  (1.676 m)   Wt 59 kg   SpO2 90%   BMI 20.98 kg/m   Physical Exam Vitals reviewed.  Constitutional:      General: She is not in acute distress.    Appearance: Normal appearance. She is not ill-appearing, toxic-appearing or diaphoretic.  Neurological:     Mental Status: She is alert.     Procedures  Procedures  ED Course / MDM   Clinical Course as of 06/21/23 1804  Mon Jun 21, 2023  1606 D/c pending CT head result Fall on Thursday, landed on R chest wall 2 ribs fx on xray Hx interstitial lung dz on PRN O2 initially, now on RA IS pending, f/u Pt wants to go home Needs to go home w/ oxycodone 5 [JR]    Clinical Course User Index [JR] Rolla Flatten, MD   Medical Decision Making Risk Prescription drug management. Decision regarding hospitalization.   I assumed care of this patient from Dr. Jean Rosenthal at the start of my shift.  Plan at the time of signout is to follow-up on pending CT head.  Patient reportedly would like to go home if work appears reassuring.  CT head without evidence of acute traumatic injury.  Waiting for pending imaging, the patient's friend, who is at bedside, stated that the patient's family would like to have the patient evaluated for referral to rehab facility.  Patient currently lives alone and is a fall risk.  Both the patient and her family are concerned that she may have recurrent falls if she were to be discharged home.  Patient is having weakness here and difficulty ambulating.  I spoke with the patient, she is amenable to staying for pain control and PT/OT evaluation.  I discussed this patient with the hospitalist, Dr. Adela Glimpse, who has accepted her for admission.       Rolla Flatten, MD 06/21/23 2200    Glyn Ade, MD 06/21/23 2251

## 2023-06-21 NOTE — ED Provider Notes (Signed)
[place] EMERGENCY DEPARTMENT AT [facility] Provider Note   CSN: [id number] Arrival date & time: 06/21/23  1000     History  Chief Complaint  Patient presents with   Rib Injury    Stacey [name] is a 82 y.o. 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  Patient reports that 4 days ago, she fell while standing up from the toilet and landed on her right rib cage.  Also reports that she noticed a bruise below her right eye after the fall, but does not specifically remember hitting her head.  Denies loss of consciousness or taking blood thinners.  Currently complaining of right lower lateral rib cage tenderness to palpation.  Denies headaches, neck, back, abdominal pain.  States she has been ambulatory since the fall.  Denies other concerns including fevers, cough, congestion, vomiting, diarrhea.  HPI     Home Medications Prior to Admission medications   Medication Sig Start Date End Date Taking? Authorizing Provider  acetaminophen (TYLENOL) 500 MG tablet Take 1,000 mg by mouth every 6 (six) hours as needed for moderate pain (pain score 4-6).   Yes [provider]  amitriptyline (ELAVIL) 25 MG tablet Take 25 mg by mouth at bedtime. 02/04/15  Yes [provider]  BIOTIN PO Take 1 tablet by mouth daily.   Yes [provider]  Cholecalciferol (VITAMIN D-3 PO) Take 1 capsule by mouth daily.   Yes [provider]  gabapentin (NEURONTIN) 300 MG capsule Take 600 mg by mouth at bedtime.   Yes [provider]  losartan (COZAAR) 25 MG tablet Take 25 mg by mouth daily. 02/04/21  Yes [provider]  methylPREDNISolone (MEDROL) 4 MG tablet Take 1 tablet (4 mg total) by mouth every other day. Alternate with the 2mg  dose Patient taking differently: Take 2-4 mg by mouth See admin instructions. Alternate taking 2 mg (1/2 tablet) and 4 mg (1  tablet) every other day in the moring. 06/22/19  Yes Lonia Blood, MD  Multiple Vitamins-Minerals (EYE VITAMINS) CAPS Take 1 capsule by mouth daily.   Yes [provider]  Nintedanib (OFEV) 100 MG CAPS Take 1 capsule (100 mg total) by mouth 2 (two) times daily. 12/02/22  Yes Pahwani, Daleen Bo, MD  ondansetron (ZOFRAN) 4 MG tablet TAKE ONE TABLET BY MOUTH EVERY 8 HOURS AS NEEDED FOR NAUSEA AND VOMITING Patient taking differently: Take 4 mg by mouth See admin instructions. Take 1 tablet (4mg ) every morning and may take up to 2 more tablets during the day (up to three tablets daily) if needed for nausea. 03/17/23  Yes Kalman Shan, MD  oxyCODONE-acetaminophen (PERCOCET/ROXICET) 5-325 MG tablet Take 1 tablet by mouth every 6 (six) hours as needed for severe pain (pain score 7-10). 06/21/23  Yes Janyth Pupa, MD  pravastatin (PRAVACHOL) 20 MG tablet Take 1 tablet (20 mg total) by mouth daily. 02/10/23  Yes Jodelle Red, MD  riTUXimab (RITUXAN IV) Inject 1,000 mg into the vein every 6 (six) months.   Yes [provider]  sulfamethoxazole-trimethoprim (BACTRIM DS) 800-160 MG tablet Take 1 tablet by mouth 2 (two) times daily.   Yes [provider]  venlafaxine XR (EFFEXOR-XR) 75 MG 24 hr capsule Take 75 mg by mouth at bedtime.    Yes [provider]      Allergies    Penicillins and Remicade [infliximab]    Review of  Systems   Review of Systems  Physical Exam Updated Vital Signs BP 137/66   Pulse 78   Temp 98.2 F (36.8 C)   Resp 15   Ht 5\' 6"  (1.676 m)   Wt 59 kg   SpO2 90%   BMI 20.98 kg/m  Physical Exam HENT:     Head: Normocephalic.     Comments: Small bruise inferior to R eye    Nose: Nose normal.     Mouth/Throat:     Mouth: Mucous membranes are moist.     Pharynx: Oropharynx is clear.  Eyes:     Extraocular Movements: Extraocular movements intact.     Pupils: Pupils are equal, round, and reactive to light.  Neck:     Comments:  No midline C, T, L-spine tenderness to palpation Cardiovascular:     Rate and Rhythm: Normal rate and regular rhythm.     Heart sounds: Normal heart sounds. No murmur heard.    No friction rub. No gallop.  Pulmonary:     Breath sounds: No stridor. Rales present. No wheezing or rhonchi.  Abdominal:     Palpations: Abdomen is soft.     Tenderness: There is no abdominal tenderness. There is no guarding or rebound.  Musculoskeletal:        General: Normal range of motion.     Cervical back: Normal range of motion and neck supple.     Right lower leg: No edema.     Left lower leg: No edema.     Comments: Right posterior lateral rib cage tenderness.  Bilateral upper extremities and LLE grossly atraumatic, RLE with anterior ecchymosis over shin.  Bilateral radial pulse 2+.  Sensation and motor function intact in all 4 extremities  Skin:    General: Skin is warm and dry.  Neurological:     General: No focal deficit present.     Cranial Nerves: No cranial nerve deficit.     ED Results / Procedures / Treatments   Labs (all labs ordered are listed, but only abnormal results are displayed) Labs Reviewed  CBC WITH DIFFERENTIAL/PLATELET - Abnormal; Notable for the following components:      Result Value   Monocytes Absolute 1.3 (*)    All other components within normal limits  COMPREHENSIVE METABOLIC PANEL - Abnormal; Notable for the following components:   Glucose, Bld 103 (*)    Creatinine, Ser 1.32 (*)    Total Protein 6.2 (*)    Albumin 3.3 (*)    GFR, Estimated 40 (*)    All other components within normal limits  I-STAT CHEM 8, ED - Abnormal; Notable for the following components:   Creatinine, Ser 1.10 (*)    Glucose, Bld 101 (*)    Calcium, Ion 1.13 (*)    All other components within normal limits    EKG None  Radiology CT CHEST ABDOMEN PELVIS W CONTRAST  Result Date: 06/21/2023 CLINICAL DATA:  Right rib pain after fall several days ago. EXAM: CT CHEST, ABDOMEN, AND PELVIS  WITH CONTRAST TECHNIQUE: Multidetector CT imaging of the chest, abdomen and pelvis was performed following the standard protocol during bolus administration of intravenous contrast. RADIATION DOSE REDUCTION: This exam was performed according to the departmental dose-optimization program which includes automated exposure control, adjustment of the mA and/or kV according to patient size and/or use of iterative reconstruction technique. CONTRAST:  60mL OMNIPAQUE IOHEXOL 350 MG/ML SOLN COMPARISON:  Nov 17, 2022.  March 04, 2020. FINDINGS: CT CHEST FINDINGS Cardiovascular: Atherosclerosis  of thoracic aorta is noted without aneurysm or dissection. Mild coronary calcifications are noted. Normal cardiac size. No pericardial effusion. Mediastinum/Nodes: Small sliding-type hiatal hernia. No adenopathy. Thyroid gland is unremarkable. Lungs/Pleura: No pneumothorax or pleural effusion is noted. Stable bilateral interstitial opacities are noted as well as biapical scarring consistent with interstitial lung disease. No acute pulmonary abnormality is noted. Musculoskeletal: Minimally displaced fractures are seen involving the right ninth and tenth ribs. CT ABDOMEN PELVIS FINDINGS Hepatobiliary: No focal liver abnormality is seen. Status post cholecystectomy. No biliary dilatation. Pancreas: Unremarkable. No pancreatic ductal dilatation or surrounding inflammatory changes. Spleen: Normal in size without focal abnormality. Adrenals/Urinary Tract: Adrenal glands are unremarkable. Kidneys are normal, without renal calculi, focal lesion, or hydronephrosis. Bladder is unremarkable. Stomach/Bowel: The stomach is unremarkable. There is no evidence of bowel obstruction or inflammation. Large amount of stool is noted in the right and transverse colon. Appendix is not clearly visualized. Vascular/Lymphatic: Aortic atherosclerosis. No enlarged abdominal or pelvic lymph nodes. Reproductive: Uterus and bilateral adnexa are unremarkable. Other:  No abdominal wall hernia or abnormality. No abdominopelvic ascites. Musculoskeletal: No acute or significant osseous findings. IMPRESSION: Minimally displaced right ninth and tenth rib fractures. Stable bilateral pulmonary findings are noted consistent with biapical scarring and chronic interstitial lung disease. Small sliding-type hiatal hernia. Large amount of stool seen in the colon. Mild coronary artery calcifications are noted. Aortic Atherosclerosis (ICD10-I70.0). Electronically Signed   By: Lupita Raider M.D.   On: 06/21/2023 12:46   DG Ribs Unilateral W/Chest Right  Result Date: 06/21/2023 CLINICAL DATA:  Fall, rib pain. Large volume stool in the RIGHT colon. EXAM: RIGHT RIBS AND CHEST - 3+ VIEW COMPARISON:  None Available. FINDINGS: Nondisplaced fracture of the RIGHT lateral ninth rib. No pneumothorax or pleural fluid identified. Chronic bronchitic markings in the lungs. Atherosclerotic calcification of the aorta. IMPRESSION: 1. Nondisplaced fracture of the RIGHT lateral ninth rib. 2. No pneumothorax. Electronically Signed   By: Genevive Bi M.D.   On: 06/21/2023 12:10    Procedures Procedures    Medications Ordered in ED Medications  acetaminophen (TYLENOL) tablet 1,000 mg (1,000 mg Oral Given 06/21/23 1032)  oxyCODONE (Oxy IR/ROXICODONE) immediate release tablet 5 mg (5 mg Oral Given 06/21/23 1032)  iohexol (OMNIPAQUE) 350 MG/ML injection 60 mL (60 mLs Intravenous Contrast Given 06/21/23 1147)    ED Course/ Medical Decision Making/ A&P Clinical Course as of 06/21/23 1634  Mon Jun 21, 2023  1606 D/c pending CT head result Fall on Thursday, landed on R chest wall 2 ribs fx on xray Hx interstitial lung dz on PRN O2 initially, now on RA IS pending, f/u Pt wants to go home Needs to go home w/ oxycodone 5 [JR]    Clinical Course User Index [JR] Rolla Flatten, MD                                 Medical Decision Making Risk Prescription drug management.   Vital signs  stable on 2 L O2, physical exam with small bruise inferior to the right eye, right lateral rib cage tenderness to palpation, and right shin bruise.  CBC with no leukocytosis or anemia.  Metabolic panel with no gross metabolic or electrolyte abnormality.  Discussed obtaining CT scans and x-rays of the right lower leg, patient reported she does not think anything is broken and declined x-rays but is agreeable to CT scans.  CT chest abdomen pelvis concerning for minimally  displaced right ninth and 10th rib fractures.  CT head was pending at signout.  I discussed the results of the CT scan with the patient and her son.  Patient was administered oxycodone and Tylenol in triage and reports significant improvement of her pain.  She was weaned to room air with an O2 saturation of 90%.  CT scan showed no evidence of pneumonia or atelectasis secondary to her rib fractures.  We discussed option for admission versus discharge as the patient has oxygen at home.  They reported they both prefer discharge as the patient has been managing her symptoms at home without medication already.  I feel this is very reasonable and I have sent Percocet prescription to her pharmacy.  CT head had not yet resulted at the time of signout, she was signed out to the oncoming physician in stable condition, likely discharge pending CT head.        Final Clinical Impression(s) / ED Diagnoses Final diagnoses:  Closed fracture of multiple ribs of right side, initial encounter    Rx / DC Orders ED Discharge Orders          Ordered    oxyCODONE-acetaminophen (PERCOCET/ROXICET) 5-325 MG tablet  Every 6 hours PRN        06/21/23 1611              Janyth Pupa, MD 06/21/23 1634    Blane Ohara, MD 06/22/23 [phone]

## 2023-06-21 NOTE — Assessment & Plan Note (Signed)
Will need PT OT assessment prior to discharge  

## 2023-06-21 NOTE — ED Triage Notes (Signed)
Right rib pain for a few days after a fall. Hx of interstitial lung disease. EMS states initial O2 at RA was 86%. O2 at 2LPM [place] initiated. Alert and oriented x 4.

## 2023-06-21 NOTE — H&P (Signed)
Stacey [name] ZDG:[id number] DOB: Nov 13, 1940 DOA: 06/21/2023     PCP: [name], MD   Outpatient Specialists:   CARDS: Dr. Jodelle Red, MD   Pulmonary  Dr. Marchelle Gearing    Patient arrived to ER on 06/21/23 at 1000 Referred by Attending Glyn Ade, MD   Patient coming from:    home Lives alone,       Chief Complaint:   Chief Complaint  Patient presents with   Rib Injury    HPI: Stacey [name] is a 82 y.o. female with medical history significant of ILD, Protonix, RA, GERD    Presented with  rib pain Patient presents for right rib pain for the past few days after she had a fall(fell in the bathroom while trying to stand up from a toilet and hit right side of her rib cage) does not remember hitting her head no LOC has been walking since her fall no headaches no neck pain no back pain History interstitial lung disease on arrival EMS noted O2 sats 86% started on 2 L nasal cannula   Reports no BM since last week  Denies significant ETOH intake   Does not smoke   Lab Results  Component Value Date   SARSCOV2NAA NEGATIVE 12/20/2020   SARSCOV2NAA NEGATIVE 10/18/2020   SARSCOV2NAA NEGATIVE 09/06/2020   SARSCOV2NAA NEGATIVE 02/09/2020       Regarding pertinent Chronic problems:    Hyperlipidemia - on statins Pravachol Lipid Panel     Component Value Date/Time   CHOL 222 (H) 12/19/2018 1532   TRIG 79 06/11/2019 2338   HDL 59 12/19/2018 1532   CHOLHDL 3.8 12/19/2018 1532   VLDL 26 12/19/2018 1532   LDLCALC 137 (H) 12/19/2018 1532     HTN on cozaar   chronic CHF diastolic/systolic/ combined - last echo  Recent Results (from the past 59563 hour(s))  ECHOCARDIOGRAM COMPLETE   Collection Time: 12/10/20  4:12 PM  Result Value   Area-P 1/2 3.21   S' Lateral 2.60   Narrative      ECHOCARDIOGRAM REPORT      IMPRESSIONS    1. Left ventricular ejection fraction, by estimation, is 65 to 70%. The left ventricle has normal function. The left  ventricle has no regional wall motion abnormalities. Left ventricular diastolic parameters are consistent with Grade I diastolic  dysfunction (impaired relaxation). Elevated left ventricular end-diastolic pressure. The E/e' is 8. The average left ventricular global longitudinal strain is -18.4 %.  2. Right ventricular systolic function is normal. The right ventricular size is normal. There is mildly elevated pulmonary artery systolic pressure. The estimated right ventricular systolic pressure is 36.9 mmHg.  3. The mitral valve is abnormal. Mild mitral valve regurgitation.  4. The aortic valve is tricuspid. Aortic valve regurgitation is not visualized. Mild aortic valve sclerosis is present, with no evidence of aortic valve stenosis.  5. The inferior vena cava is normal in size with greater than 50% respiratory variability, suggesting right atrial pressure of 3 mmHg.  Comparison(s): Changes from prior study are noted. 12/18/18 EF 60-65%. PA pressure .            Asthma -well   controlled on home inhalers/ nebs                     While in ER: Clinical Course as of 06/21/23 2043  Mon Jun 21, 2023  1606 D/c pending CT head result Fall on Thursday, landed on R chest wall 2 ribs  fx on xray Hx interstitial lung dz on PRN O2 initially, now on RA IS pending, f/u Pt wants to go home Needs to go home w/ oxycodone 5 [JR]    Clinical Course User Index [JR] Rolla Flatten, MD       Lab Orders         CBC with Differential         Comprehensive metabolic panel         I-stat chem 8, ED (not at [facility], [facility] or [facility])      CT HEAD Interval chronic left caudate lacunar infarct      CXR -  1. Nondisplaced fracture of the RIGHT lateral ninth rib. 2. No pneumothorax.    CTabd/pelvis -  Minimally displaced right ninth and tenth rib fractures.   Stable bilateral pulmonary findings are noted consistent with biapical scarring and chronic interstitial lung disease.   Small sliding-type hiatal  hernia.   Large amount of stool seen in the colon.   Following Medications were ordered in ER: Medications  acetaminophen (TYLENOL) tablet 1,000 mg (1,000 mg Oral Given 06/21/23 1032)  oxyCODONE (Oxy IR/ROXICODONE) immediate release tablet 5 mg (5 mg Oral Given 06/21/23 1032)  iohexol (OMNIPAQUE) 350 MG/ML injection 60 mL (60 mLs Intravenous Contrast Given 06/21/23 1147)  oxyCODONE (Oxy IR/ROXICODONE) immediate release tablet 5 mg (5 mg Oral Given 06/21/23 1821)  ondansetron (ZOFRAN-ODT) disintegrating tablet 4 mg (4 mg Oral Given 06/21/23 2026)        ED Triage Vitals  Encounter Vitals Group     BP 06/21/23 1009 124/67     Systolic BP Percentile --      Diastolic BP Percentile --      Pulse Rate 06/21/23 1009 85     Resp 06/21/23 1009 15     Temp 06/21/23 1009 98.1 F (36.7 C)     Temp Source 06/21/23 1009 Oral     SpO2 06/21/23 1009 100 %     Weight 06/21/23 1007 130 lb (59 kg)     Height 06/21/23 1007 5\' 6"  (1.676 m)     Head Circumference --      Peak Flow --      Pain Score 06/21/23 1006 8     Pain Loc --      Pain Education --      Exclude from Growth Chart --   GEXB(28)@     _________________________________________ Significant initial  Findings: Abnormal Labs Reviewed  CBC WITH DIFFERENTIAL/PLATELET - Abnormal; Notable for the following components:      Result Value   Monocytes Absolute 1.3 (*)    All other components within normal limits  COMPREHENSIVE METABOLIC PANEL - Abnormal; Notable for the following components:   Glucose, Bld 103 (*)    Creatinine, Ser 1.32 (*)    Total Protein 6.2 (*)    Albumin 3.3 (*)    GFR, Estimated 40 (*)    All other components within normal limits  I-STAT CHEM 8, ED - Abnormal; Notable for the following components:   Creatinine, Ser 1.10 (*)    Glucose, Bld 101 (*)    Calcium, Ion 1.13 (*)    All other components within normal limits    ECG: Ordered   BNP (last 3 results) No results for input(s): "BNP" in the last 8760  hours.   COVID-19 Labs  No results for input(s): "DDIMER", "FERRITIN", "LDH", "CRP" in the last 72 hours.  Lab Results  Component Value Date   SARSCOV2NAA NEGATIVE  12/20/2020   SARSCOV2NAA NEGATIVE 10/18/2020   SARSCOV2NAA NEGATIVE 09/06/2020   SARSCOV2NAA NEGATIVE 02/09/2020    ____________________   The recent clinical data is shown below. Vitals:   06/21/23 1007 06/21/23 1009 06/21/23 1406 06/21/23 1905  BP:  124/67 137/66 (!) 151/73  Pulse:  85 78 75  Resp:  15 15 16   Temp:  98.1 F (36.7 C) 98.2 F (36.8 C) (!) 97.3 F (36.3 C)  TempSrc:  Oral  Oral  SpO2:  100% 90% 98%  Weight: 59 kg     Height: 5\' 6"  (1.676 m)       WBC     Component Value Date/Time   WBC 9.9 06/21/2023 1047   LYMPHSABS 2.1 06/21/2023 1047   MONOABS 1.3 (H) 06/21/2023 1047   EOSABS 0.3 06/21/2023 1047   BASOSABS 0.1 06/21/2023 1047       UA   no evidence of UTI     Urine analysis:    Component Value Date/Time   COLORURINE YELLOW 10/08/2021 1958   APPEARANCEUR CLEAR 10/08/2021 1958   LABSPEC 1.010 10/08/2021 1958   PHURINE 7.0 10/08/2021 1958   GLUCOSEU NEGATIVE 10/08/2021 1958   HGBUR NEGATIVE 10/08/2021 1958   BILIRUBINUR NEGATIVE 10/08/2021 1958   KETONESUR NEGATIVE 10/08/2021 1958   PROTEINUR NEGATIVE 10/08/2021 1958   UROBILINOGEN 0.2 12/18/2009 0120   NITRITE NEGATIVE 10/08/2021 1958   LEUKOCYTESUR TRACE (A) 10/08/2021 1958    Results for orders placed or performed during the hospital encounter of 11/17/22  Culture, blood (Routine X 2) w Reflex to ID Panel     Status: None   Collection Time: 11/18/22 10:40 AM   Specimen: BLOOD  Result Value Ref Range Status   Specimen Description   Final    BLOOD BLOOD RIGHT WRIST Performed at [facility], [address], [place], [place] [address]    Special Requests   Final    BOTTLES DRAWN AEROBIC AND ANAEROBIC Blood Culture results may not be optimal due to an excessive volume of blood received in culture  bottles Performed at [facility], [address], [place], [place] [address]    Culture   Final    NO GROWTH 5 DAYS Performed at [facility], [address] [place], [place] [address]    Report Status 11/23/2022 FINAL  Final  Culture, blood (Routine X 2) w Reflex to ID Panel     Status: None   Collection Time: 11/18/22 10:49 AM   Specimen: BLOOD  Result Value Ref Range Status   Specimen Description   Final    BLOOD BLOOD RIGHT FOREARM Performed at [facility], [address], [place], [place] [address]    Special Requests   Final    BOTTLES DRAWN AEROBIC AND ANAEROBIC Blood Culture results may not be optimal due to an excessive volume of blood received in culture bottles Performed at [facility], [address], [place], [place] [address]    Culture   Final    NO GROWTH 5 DAYS Performed at [facility], [address], [place], [place] [address]    Report Status 11/23/2022 FINAL  Final     __________________________________________________________ Recent Labs  Lab 06/21/23 1039 06/21/23 1047  NA 137 138  K 4.2 4.2  CO2  --  27  GLUCOSE 101* 103*  BUN 14 12  CREATININE 1.10* 1.32*  CALCIUM  --  8.9    Cr   Up from  baseline see below Lab Results  Component Value Date   CREATININE 1.32 (H) 06/21/2023   CREATININE 1.10 (H) 06/21/2023   CREATININE 0.72 12/11/2022    Recent Labs  Lab 06/21/23 1047  AST 24  ALT 14  ALKPHOS 53  BILITOT 0.6  PROT 6.2*  ALBUMIN 3.3*   Lab Results  Component Value Date   CALCIUM 8.9 06/21/2023   PHOS 3.0 03/29/2018    Plt: Lab Results  Component Value Date   PLT 266 06/21/2023    Recent Labs  Lab 06/21/23 1039 06/21/23 1047  WBC  --  9.9  NEUTROABS  --  6.2  HGB 12.9 12.3  HCT 38.0 39.0  MCV  --  94.9  PLT  --  266    HG/HCT  stable,     Component Value Date/Time   HGB 12.3 06/21/2023 1047   HGB LA11 03/26/2018 0507   HCT 39.0 06/21/2023  1047   MCV 94.9 06/21/2023 1047    _______________________________________________ Hospitalist was called for admission for   Closed fracture of  9th rib    The following Work up has been ordered so far:  Orders Placed This Encounter  Procedures   DG Ribs Unilateral W/Chest Right   CT CHEST ABDOMEN PELVIS W CONTRAST   CT Head Wo Contrast   CBC with Differential   Comprehensive metabolic panel   Consult to hospitalist   Incentive spirometry RT   I-stat chem 8, ED (not at [facility], [facility] or [facility])     OTHER Significant initial  Findings:  labs showing:     DM  labs:  HbA1C: No results for input(s): "HGBA1C" in the last 8760 hours.     CBG (last 3)  No results for input(s): "GLUCAP" in the last 72 hours.        Cultures:    Component Value Date/Time   SDES  11/18/2022 1049    BLOOD BLOOD RIGHT FOREARM Performed at [facility], [address] [place], [place] [address]    SPECREQUEST  11/18/2022 1049    BOTTLES DRAWN AEROBIC AND ANAEROBIC Blood Culture results may not be optimal due to an excessive volume of blood received in culture bottles Performed at [facility], [address], [place], [place] [address]    CULT  11/18/2022 1049    NO GROWTH 5 DAYS Performed at [facility], [address] [place], [place] [address]    REPTSTATUS 11/23/2022 FINAL 11/18/2022 1049     Radiological Exams on Admission: CT Head Wo Contrast  Result Date: 06/21/2023 CLINICAL DATA:  Head trauma, minor (Age >= 65y).  Recent fall. EXAM: CT HEAD WITHOUT CONTRAST TECHNIQUE: Contiguous axial images were obtained from the base of the skull through the vertex without intravenous contrast. RADIATION DOSE REDUCTION: This exam was performed according to the departmental dose-optimization program which includes automated exposure control, adjustment of the mA and/or kV according to patient size and/or use of iterative reconstruction technique. COMPARISON:   Head CT 01/01/2022 FINDINGS: Brain: There is no evidence of an acute infarct, intracranial hemorrhage, mass, midline shift, or extra-axial fluid collection. A lacunar infarct in the left caudate head is new but chronic in appearance. Central predominant cerebral atrophy is unchanged. Vascular: No hyperdense vessel. Skull: No acute fracture or suspicious osseous lesion. Sinuses/Orbits: Minimal mucosal thickening in the right frontal and ethmoid sinuses. Clear mastoid air cells. Bilateral cataract extraction. Other: None. IMPRESSION: 1. No evidence of acute intracranial abnormality. 2. Interval chronic  left caudate lacunar infarct. 3. Cerebral atrophy. Electronically Signed   By: Alcide Clever M.D.   On: 06/21/2023 20:17   CT CHEST ABDOMEN PELVIS W CONTRAST  Result Date: 06/21/2023 CLINICAL DATA:  Right rib pain after fall several days ago. EXAM: CT CHEST, ABDOMEN, AND PELVIS WITH CONTRAST TECHNIQUE: Multidetector CT imaging of the chest, abdomen and pelvis was performed following the standard protocol during bolus administration of intravenous contrast. RADIATION DOSE REDUCTION: This exam was performed according to the departmental dose-optimization program which includes automated exposure control, adjustment of the mA and/or kV according to patient size and/or use of iterative reconstruction technique. CONTRAST:  60mL OMNIPAQUE IOHEXOL 350 MG/ML SOLN COMPARISON:  Nov 17, 2022.  March 04, 2020. FINDINGS: CT CHEST FINDINGS Cardiovascular: Atherosclerosis of thoracic aorta is noted without aneurysm or dissection. Mild coronary calcifications are noted. Normal cardiac size. No pericardial effusion. Mediastinum/Nodes: Small sliding-type hiatal hernia. No adenopathy. Thyroid gland is unremarkable. Lungs/Pleura: No pneumothorax or pleural effusion is noted. Stable bilateral interstitial opacities are noted as well as biapical scarring consistent with interstitial lung disease. No acute pulmonary abnormality is noted.  Musculoskeletal: Minimally displaced fractures are seen involving the right ninth and tenth ribs. CT ABDOMEN PELVIS FINDINGS Hepatobiliary: No focal liver abnormality is seen. Status post cholecystectomy. No biliary dilatation. Pancreas: Unremarkable. No pancreatic ductal dilatation or surrounding inflammatory changes. Spleen: Normal in size without focal abnormality. Adrenals/Urinary Tract: Adrenal glands are unremarkable. Kidneys are normal, without renal calculi, focal lesion, or hydronephrosis. Bladder is unremarkable. Stomach/Bowel: The stomach is unremarkable. There is no evidence of bowel obstruction or inflammation. Large amount of stool is noted in the right and transverse colon. Appendix is not clearly visualized. Vascular/Lymphatic: Aortic atherosclerosis. No enlarged abdominal or pelvic lymph nodes. Reproductive: Uterus and bilateral adnexa are unremarkable. Other: No abdominal wall hernia or abnormality. No abdominopelvic ascites. Musculoskeletal: No acute or significant osseous findings. IMPRESSION: Minimally displaced right ninth and tenth rib fractures. Stable bilateral pulmonary findings are noted consistent with biapical scarring and chronic interstitial lung disease. Small sliding-type hiatal hernia. Large amount of stool seen in the colon. Mild coronary artery calcifications are noted. Aortic Atherosclerosis (ICD10-I70.0). Electronically Signed   By: Lupita Raider M.D.   On: 06/21/2023 12:46   DG Ribs Unilateral W/Chest Right  Result Date: 06/21/2023 CLINICAL DATA:  Fall, rib pain. Large volume stool in the RIGHT colon. EXAM: RIGHT RIBS AND CHEST - 3+ VIEW COMPARISON:  None Available. FINDINGS: Nondisplaced fracture of the RIGHT lateral ninth rib. No pneumothorax or pleural fluid identified. Chronic bronchitic markings in the lungs. Atherosclerotic calcification of the aorta. IMPRESSION: 1. Nondisplaced fracture of the RIGHT lateral ninth rib. 2. No pneumothorax. Electronically Signed   By:  Genevive Bi M.D.   On: 06/21/2023 12:10   _______________________________________________________________________________________________________ Latest  Blood pressure (!) 151/73, pulse 75, temperature (!) 97.3 F (36.3 C), temperature source Oral, resp. rate 16, height 5\' 6"  (1.676 m), weight 59 kg, SpO2 98%.   Vitals  labs and radiology finding personally reviewed  Review of Systems:    Pertinent positives include: rib pain, falls  Constitutional:  No weight loss, night sweats, Fevers, chills, fatigue, weight loss  HEENT:  No headaches, Difficulty swallowing,Tooth/dental problems,Sore throat,  No sneezing, itching, ear ache, nasal congestion, post nasal drip,  Cardio-vascular:  No chest pain, Orthopnea, PND, anasarca, dizziness, palpitations.no Bilateral lower extremity swelling  GI:  No heartburn, indigestion, abdominal pain, nausea, vomiting, diarrhea, change in bowel habits, loss of appetite, melena, blood in stool,  hematemesis Resp:  no shortness of breath at rest. No dyspnea on exertion, No excess mucus, no productive cough, No non-productive cough, No coughing up of blood.No change in color of mucus.No wheezing. Skin:  no rash or lesions. No jaundice GU:  no dysuria, change in color of urine, no urgency or frequency. No straining to urinate.  No flank pain.  Musculoskeletal:  No joint pain or no joint swelling. No decreased range of motion. No back pain.  Psych:  No change in mood or affect. No depression or anxiety. No memory loss.  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   Lichen sclerosus 05/26/06   Asymptomatic   Low iron    Mitral valve prolapse    Osteoarthritis    Osteoporosis    Pneumonia    Post herpetic neuralgia    Rheumatoid arthritis(714.0)    Yeast infection       Past Surgical History:  Procedure Laterality Date   CHOLECYSTECTOMY  2011   IR ANGIO INTRA EXTRACRAN SEL COM CAROTID INNOMINATE BILAT MOD SED  06/18/2020   IR ANGIO VERTEBRAL SEL SUBCLAVIAN INNOMINATE UNI R MOD SED  06/18/2020   IR ANGIO VERTEBRAL SEL VERTEBRAL UNI L MOD SED  06/18/2020   WISDOM TOOTH EXTRACTION      Social History:  Ambulatory   walker     reports that she has never smoked. She has never used smokeless tobacco. She reports that she does not drink alcohol and does not use drugs.     Family History:   Family History  Problem Relation Age of Onset   Asthma Mother    Anemia Mother    Polymyalgia rheumatica Mother    COPD Father    Pulmonary fibrosis Father    Breast cancer Maternal Grandmother 26   ______________________________________________________________________________________________ Allergies: Allergies  Allergen Reactions   Penicillins Other (See Comments)    Unknown reaction   Remicade [Infliximab] Other (See Comments)    Unknown reaction     Prior to Admission medications   Medication Sig Start Date End Date Taking? Authorizing Provider  acetaminophen (TYLENOL) 500 MG tablet Take 1,000 mg by mouth every 6 (six) hours as needed for moderate pain (pain score 4-6).   Yes [provider]  amitriptyline (ELAVIL) 25 MG tablet Take 25 mg by mouth at bedtime. 02/04/15  Yes [provider]  BIOTIN PO Take 1 tablet by mouth daily.   Yes [provider]  Cholecalciferol (VITAMIN D-3 PO) Take 1 capsule by mouth daily.   Yes [provider]  gabapentin (NEURONTIN) 300 MG capsule Take 600 mg by mouth at bedtime.    Yes [provider]  losartan (COZAAR) 25 MG tablet Take 25 mg by mouth daily. 02/04/21  Yes [provider]  methylPREDNISolone (MEDROL) 4 MG tablet Take 1 tablet (4 mg total) by mouth every other day. Alternate with the 2mg  dose Patient taking differently: Take 2-4 mg by mouth See admin instructions. Alternate taking 2 mg (1/2 tablet) and 4 mg (1 tablet) every other day in the moring.  06/22/19  Yes Lonia Blood, MD  Multiple Vitamins-Minerals (EYE VITAMINS) CAPS Take 1 capsule by mouth daily.   Yes [provider]  Nintedanib (OFEV) 100 MG CAPS Take 1 capsule (100 mg total) by mouth 2 (two) times daily. 12/02/22  Yes Pahwani, Daleen Bo, MD  ondansetron (ZOFRAN) 4 MG tablet TAKE ONE TABLET BY MOUTH EVERY 8 HOURS AS NEEDED FOR NAUSEA AND VOMITING Patient taking differently: Take 4 mg by mouth See admin instructions. Take 1 tablet (4mg ) every morning and may take up to 2 more tablets during the day (up to three tablets daily) if needed for nausea. 03/17/23  Yes Kalman Shan, MD  oxyCODONE-acetaminophen (PERCOCET/ROXICET) 5-325 MG tablet Take 1 tablet by mouth every 6 (six) hours as needed for severe pain (pain score 7-10). 06/21/23  Yes Janyth Pupa, MD  pravastatin (PRAVACHOL) 20 MG tablet Take 1 tablet (20 mg total) by mouth daily. 02/10/23  Yes Jodelle Red, MD  riTUXimab (RITUXAN IV) Inject 1,000 mg into the vein every 6 (six) months.   Yes [provider]  sulfamethoxazole-trimethoprim (BACTRIM DS) 800-160 MG tablet Take 1 tablet by mouth 2 (two) times daily.   Yes [provider]  venlafaxine XR (EFFEXOR-XR) 75 MG 24 hr capsule Take 75 mg by mouth at bedtime.    Yes [provider]    ___________________________________________________________________________________________________ Physical Exam:    06/21/2023    7:05 PM 06/21/2023    2:06 PM 06/21/2023   10:09 AM  Vitals with BMI  Systolic 151 137 540  Diastolic 73 66 67   Pulse 75 78 85     1. General:  in No  Acute distress   Chronically ill   2. Psychological: Alert and   Oriented 3. Head/ENT:   Dry Mucous Membranes                          Head Non traumatic, neck supple                          Poor Dentition 4. SKIN: decreased Skin turgor,  Skin clean Dry and intact no rash    5. Heart: Regular rate and rhythm no  Murmur, no Rub or gallop 6. Lungs  no wheezes or crackles   7. Abdomen: Soft,  tender,  distended  bowel sounds diminished 8. Lower extremities: no clubbing, cyanosis,Right leg edema 9. Neurologically Grossly intact, moving all 4 extremities equally  10. MSK: Normal range of motion    Chart has been reviewed  ______________________________________________________________________________________________  Assessment/Plan  82 y.o. female with medical history significant of ILD, Protonix, RA, GERD  Admitted for   Closed fracture of 9th  rib  of right side,    Present on Admission:  Rib fracture  Acute on chronic respiratory failure with hypoxia (HCC)  Essential hypertension  Rheumatoid arthritis (HCC)  AKI (acute kidney injury) (HCC)  Debility  Constipation     Acute on chronic respiratory failure with hypoxia (HCC) Transient in the setting of recent rib fracture currently improved  Chronic lung disease Follow-up as an outpatient by pulmonology  Essential hypertension Allow permissive hypertension for tonight  Fall at home, initial encounter Will need PT OT assessment prior to discharge  Rib fracture Incentive spirometry pain management check pulse ox Monitor for evidence of hypoxia  Rheumatoid arthritis (HCC) Chronic stable  AKI (acute kidney injury) (HCC) Obtain your electrolytes and rehydrate Of note pt severely constipated  with decreased po intake  Also has been on Bactrim that can artificially increase cr  Debility Will need PT OT assessment prior to discharge possible placement patient has been on the  list awaiting placement while home  Constipation Severe constipation Will order bowel regimen May benefit from an enema    Other plan as per orders.  DVT prophylaxis:  SCD     Code Status:    Code Status: Prior FULL CODE as per patient   I had personally discussed CODE STATUS with patient   ACP   none   Family Communication:   Family not at  Bedside    Diet heart healthy   Disposition Plan:     likely will need placement for rehabilitation                             Following barriers for discharge:                                                          Pain controlled with PO medications                                    Consult Orders  (From admission, onward)           Start     Ordered   06/21/23 2017  Consult to hospitalist  Paged by autumn  Once       Provider:  (Not yet assigned)  Question Answer Comment  Place call to: [facility]   Reason for Consult Admit      06/21/23 2016                               Would benefit from PT/OT eval prior to DC  Ordered                                       Transition of care consulted                   Nutrition    consulted                    Consults called: none     Admission status:  ED Disposition     ED Disposition  Admit   Condition  --   Comment  Hospital Area: [facility] [[id number]]  Level of Care: Telemetry Medical [[id number]]  May place patient in observation at [facility] or [facility] if equivalent level of care is available:: No  Covid Evaluation: Asymptomatic - no recent exposure (last 10 days) testing not required  Diagnosis: Multiple rib fractures [045409]  Admitting Physician: Therisa Doyne [[id number]]  Attending Physician: Therisa Doyne [[id number]]           Obs      Level of care     tele  For 12H    .   Klara Stjames 06/21/2023, 9:49 PM    [facility]     after 2 AM please page floor coverage  PA If 7AM-7PM, please contact  the day team taking care of the patient using [email or web address]

## 2023-06-21 NOTE — Assessment & Plan Note (Signed)
Allow permissive hypertension for tonight 

## 2023-06-21 NOTE — Assessment & Plan Note (Signed)
Transient in the setting of recent rib fracture currently improved

## 2023-06-21 NOTE — Subjective & Objective (Signed)
Patient presents for right rib pain for the past few days after she had a fall(fell in the bathroom while trying to stand up from a toilet and hit right side of her rib cage) does not remember hitting her head no LOC has been walking since her fall no headaches no neck pain no back pain History interstitial lung disease on arrival EMS noted O2 sats 86% started on 2 L nasal cannula

## 2023-06-21 NOTE — Assessment & Plan Note (Signed)
Chronic-stable.

## 2023-06-22 ENCOUNTER — Observation Stay (HOSPITAL_COMMUNITY): Payer: Medicare Other

## 2023-06-22 ENCOUNTER — Other Ambulatory Visit: Payer: Self-pay

## 2023-06-22 ENCOUNTER — Encounter (HOSPITAL_COMMUNITY): Payer: Self-pay | Admitting: Internal Medicine

## 2023-06-22 DIAGNOSIS — R278 Other lack of coordination: Secondary | ICD-10-CM | POA: Diagnosis not present

## 2023-06-22 DIAGNOSIS — N179 Acute kidney failure, unspecified: Secondary | ICD-10-CM | POA: Diagnosis present

## 2023-06-22 DIAGNOSIS — Z888 Allergy status to other drugs, medicaments and biological substances status: Secondary | ICD-10-CM | POA: Diagnosis not present

## 2023-06-22 DIAGNOSIS — M7989 Other specified soft tissue disorders: Secondary | ICD-10-CM | POA: Diagnosis not present

## 2023-06-22 DIAGNOSIS — W1811XA Fall from or off toilet without subsequent striking against object, initial encounter: Secondary | ICD-10-CM | POA: Diagnosis present

## 2023-06-22 DIAGNOSIS — Z880 Allergy status to penicillin: Secondary | ICD-10-CM | POA: Diagnosis not present

## 2023-06-22 DIAGNOSIS — S2231XA Fracture of one rib, right side, initial encounter for closed fracture: Secondary | ICD-10-CM | POA: Diagnosis not present

## 2023-06-22 DIAGNOSIS — K449 Diaphragmatic hernia without obstruction or gangrene: Secondary | ICD-10-CM | POA: Diagnosis present

## 2023-06-22 DIAGNOSIS — E43 Unspecified severe protein-calorie malnutrition: Secondary | ICD-10-CM | POA: Diagnosis present

## 2023-06-22 DIAGNOSIS — R609 Edema, unspecified: Secondary | ICD-10-CM

## 2023-06-22 DIAGNOSIS — S2249XA Multiple fractures of ribs, unspecified side, initial encounter for closed fracture: Secondary | ICD-10-CM | POA: Diagnosis present

## 2023-06-22 DIAGNOSIS — Z79899 Other long term (current) drug therapy: Secondary | ICD-10-CM | POA: Diagnosis not present

## 2023-06-22 DIAGNOSIS — R296 Repeated falls: Secondary | ICD-10-CM | POA: Diagnosis not present

## 2023-06-22 DIAGNOSIS — I10 Essential (primary) hypertension: Secondary | ICD-10-CM | POA: Diagnosis present

## 2023-06-22 DIAGNOSIS — F329 Major depressive disorder, single episode, unspecified: Secondary | ICD-10-CM | POA: Diagnosis not present

## 2023-06-22 DIAGNOSIS — S2241XD Multiple fractures of ribs, right side, subsequent encounter for fracture with routine healing: Secondary | ICD-10-CM | POA: Diagnosis not present

## 2023-06-22 DIAGNOSIS — Z825 Family history of asthma and other chronic lower respiratory diseases: Secondary | ICD-10-CM | POA: Diagnosis not present

## 2023-06-22 DIAGNOSIS — K5900 Constipation, unspecified: Secondary | ICD-10-CM | POA: Diagnosis present

## 2023-06-22 DIAGNOSIS — I5042 Chronic combined systolic (congestive) and diastolic (congestive) heart failure: Secondary | ICD-10-CM | POA: Diagnosis present

## 2023-06-22 DIAGNOSIS — S2241XA Multiple fractures of ribs, right side, initial encounter for closed fracture: Secondary | ICD-10-CM | POA: Diagnosis present

## 2023-06-22 DIAGNOSIS — Z9049 Acquired absence of other specified parts of digestive tract: Secondary | ICD-10-CM | POA: Diagnosis not present

## 2023-06-22 DIAGNOSIS — J9621 Acute and chronic respiratory failure with hypoxia: Secondary | ICD-10-CM | POA: Diagnosis not present

## 2023-06-22 DIAGNOSIS — Z6821 Body mass index (BMI) 21.0-21.9, adult: Secondary | ICD-10-CM | POA: Diagnosis not present

## 2023-06-22 DIAGNOSIS — R5381 Other malaise: Secondary | ICD-10-CM | POA: Diagnosis present

## 2023-06-22 DIAGNOSIS — J9601 Acute respiratory failure with hypoxia: Secondary | ICD-10-CM | POA: Diagnosis present

## 2023-06-22 DIAGNOSIS — J984 Other disorders of lung: Secondary | ICD-10-CM | POA: Diagnosis not present

## 2023-06-22 DIAGNOSIS — M069 Rheumatoid arthritis, unspecified: Secondary | ICD-10-CM | POA: Diagnosis present

## 2023-06-22 DIAGNOSIS — I700 Atherosclerosis of aorta: Secondary | ICD-10-CM | POA: Diagnosis not present

## 2023-06-22 DIAGNOSIS — J849 Interstitial pulmonary disease, unspecified: Secondary | ICD-10-CM | POA: Diagnosis present

## 2023-06-22 DIAGNOSIS — Y92002 Bathroom of unspecified non-institutional (private) residence single-family (private) house as the place of occurrence of the external cause: Secondary | ICD-10-CM | POA: Diagnosis not present

## 2023-06-22 DIAGNOSIS — M6281 Muscle weakness (generalized): Secondary | ICD-10-CM | POA: Diagnosis not present

## 2023-06-22 DIAGNOSIS — K219 Gastro-esophageal reflux disease without esophagitis: Secondary | ICD-10-CM | POA: Diagnosis present

## 2023-06-22 DIAGNOSIS — Z803 Family history of malignant neoplasm of breast: Secondary | ICD-10-CM | POA: Diagnosis not present

## 2023-06-22 DIAGNOSIS — E785 Hyperlipidemia, unspecified: Secondary | ICD-10-CM | POA: Diagnosis not present

## 2023-06-22 DIAGNOSIS — S2231XD Fracture of one rib, right side, subsequent encounter for fracture with routine healing: Secondary | ICD-10-CM | POA: Diagnosis not present

## 2023-06-22 DIAGNOSIS — R059 Cough, unspecified: Secondary | ICD-10-CM | POA: Diagnosis not present

## 2023-06-22 DIAGNOSIS — J45909 Unspecified asthma, uncomplicated: Secondary | ICD-10-CM | POA: Diagnosis present

## 2023-06-22 DIAGNOSIS — R2681 Unsteadiness on feet: Secondary | ICD-10-CM | POA: Diagnosis not present

## 2023-06-22 LAB — CBC
HCT: 37.2 % (ref 36.0–46.0)
Hemoglobin: 11.7 g/dL — ABNORMAL LOW (ref 12.0–15.0)
MCH: 29.5 pg (ref 26.0–34.0)
MCHC: 31.5 g/dL (ref 30.0–36.0)
MCV: 93.9 fL (ref 80.0–100.0)
Platelets: 247 10*3/uL (ref 150–400)
RBC: 3.96 MIL/uL (ref 3.87–5.11)
RDW: 13.8 % (ref 11.5–15.5)
WBC: 9.1 10*3/uL (ref 4.0–10.5)
nRBC: 0 % (ref 0.0–0.2)

## 2023-06-22 LAB — COMPREHENSIVE METABOLIC PANEL
ALT: 16 U/L (ref 0–44)
AST: 30 U/L (ref 15–41)
Albumin: 3 g/dL — ABNORMAL LOW (ref 3.5–5.0)
Alkaline Phosphatase: 49 U/L (ref 38–126)
Anion gap: 10 (ref 5–15)
BUN: 12 mg/dL (ref 8–23)
CO2: 25 mmol/L (ref 22–32)
Calcium: 8.3 mg/dL — ABNORMAL LOW (ref 8.9–10.3)
Chloride: 100 mmol/L (ref 98–111)
Creatinine, Ser: 0.84 mg/dL (ref 0.44–1.00)
GFR, Estimated: >60 mL/min (ref >60)
Glucose, Bld: 96 mg/dL (ref 70–99)
Potassium: 4.2 mmol/L (ref 3.5–5.1)
Sodium: 135 mmol/L (ref 135–145)
Total Bilirubin: 0.7 mg/dL (ref <1.2)
Total Protein: 5.6 g/dL — ABNORMAL LOW (ref 6.5–8.1)

## 2023-06-22 LAB — MAGNESIUM: Magnesium: 1.8 mg/dL (ref 1.7–2.4)

## 2023-06-22 LAB — OSMOLALITY, URINE: Osmolality, Ur: 679 mosm/kg (ref 300–900)

## 2023-06-22 LAB — PREALBUMIN: Prealbumin: 15 mg/dL — ABNORMAL LOW (ref 18–38)

## 2023-06-22 LAB — CREATININE, URINE, RANDOM: Creatinine, Urine: 164 mg/dL

## 2023-06-22 LAB — SODIUM, URINE, RANDOM: Sodium, Ur: 27 mmol/L

## 2023-06-22 LAB — PHOSPHORUS: Phosphorus: 3.5 mg/dL (ref 2.5–4.6)

## 2023-06-22 MED ORDER — IPRATROPIUM-ALBUTEROL 0.5-2.5 (3) MG/3ML IN SOLN
3.0000 mL | Freq: Four times a day (QID) | RESPIRATORY_TRACT | Status: DC | PRN
  Administered 2023-06-23: 3 mL via RESPIRATORY_TRACT
  Filled 2023-06-22: qty 3

## 2023-06-22 MED ORDER — SULFAMETHOXAZOLE-TRIMETHOPRIM 800-160 MG PO TABS
1.0000 | ORAL_TABLET | ORAL | Status: DC
  Administered 2023-06-23 – 2023-06-25 (×2): 1 via ORAL
  Filled 2023-06-22 (×2): qty 1

## 2023-06-22 NOTE — TOC CAGE-AID Note (Signed)
Transition of Care [facility]) - CAGE-AID Screening  Patient Details  Name: Stacey [name] MRN: [id number] Date of Birth: 1940/11/17  Clinical Narrative:  Patient denies any alcohol or drug use, no substance abuse resources provided at this time.  CAGE-AID Screening:   Have You Ever Felt You Ought to Cut Down on Your Drinking or Drug Use?: No Have People Annoyed You By Critizing Your Drinking Or Drug Use?: No Have You Felt Bad Or Guilty About Your Drinking Or Drug Use?: No Have You Ever Had a Drink or Used Drugs First Thing In The Morning to Steady Your Nerves or to Get Rid of a Hangover?: No CAGE-AID Score: 0  Substance Abuse Education Offered: No

## 2023-06-22 NOTE — Progress Notes (Signed)
PROGRESS NOTE    Stacey [name]  KVQ:[id number] DOB: 1940/12/07 DOA: 06/21/2023 PCP: Stacey Corn, MD   Brief Narrative:  This 83 yrs old female with PMH significant for Interstitial lung disease, rheumatoid arthritis, GERD presented in the ED with complaints of right-sided rib pain for last few days.  Patient reports she had a fall in the bathroom while trying to stand up from a toilet and hit the right side of her chest, she does not recall hitting her head, denies any loss of consciousness.  She has been walking since her fall.  She was found to be hypoxic on arrival with SpO2 of 86% on room air requiring 2 L of supplemental oxygen.  She is admitted for further evaluation.  Assessment & Plan:   Principal Problem:   Rib fracture Active Problems:   Rheumatoid arthritis (HCC)   Chronic lung disease   Essential hypertension   Acute on chronic respiratory failure with hypoxia (HCC)   Fall at home, initial encounter   Constipation   AKI (acute kidney injury) (HCC)   Debility   Multiple rib fractures  Closed fracture of 9/10 rib on the right side: Patient presented status post mechanical fall in the bathroom last week. X-ray showed nondisplaced fracture of right ninth rib. Continue adequate pain control. Continue incentive spirometry.  Acute hypoxic respiratory failure: Transient in the setting of recent rib fracture,  currently improved   Chronic lung disease: Follow-up as an outpatient by pulmonology   Essential hypertension Resume home blood pressure medications.   Fall at home, initial encounter Will need PT OT assessment prior to discharge    Rheumatoid arthritis (HCC) Chronic stable.   AKI (acute kidney injury) (HCC) Likely prerenal.  Now AKI resolved with IV hydration.   Debility: Will need PT OT assessment prior to discharge possible placement.    Constipation: Severe constipation Bowel regimen. May benefit from an enema     DVT prophylaxis:  SCDs Code Status: Full code Family Communication: No family at bed side. Disposition Plan:    Status is: Inpatient Remains inpatient appropriate because: Admitted for ninth and 10th nondisplaced rib fractures on the right side status post fall.  PT and OT evaluation pending for disposition    Consultants:  None  Procedures: None  Antimicrobials: None   Subjective: Patient was seen and examined at bedside.  Overnight events noted.   Patient reports still having a lot of pain on the right side of the chest.  Objective: Vitals:   06/22/23 0943 06/22/23 1200 06/22/23 1201 06/22/23 1250  BP: 135/66   (!) 140/68  Pulse: 80   75  Resp: 18   17  Temp: 98.2 F (36.8 C)   98.6 F (37 C)  TempSrc: Oral   Oral  SpO2: 97% (!) 87% 97% 100%  Weight:      Height:        Intake/Output Summary (Last 24 hours) at 06/22/2023 1612 Last data filed at 06/22/2023 0945 Gross per 24 hour  Intake 1060 ml  Output 40 ml  Net 1020 ml   Filed Weights   06/21/23 1007 06/21/23 2255  Weight: 59 kg 59.8 kg    Examination:  General exam: Appears calm and comfortable, deconditioned, not in any acute distress Respiratory system: Clear to auscultation. Respiratory effort normal. Cardiovascular system: S1 & S2 heard, RRR. No JVD, murmurs, rubs, gallops or clicks. No pedal edema.  Right-sided chest tenderness noted Gastrointestinal system: Abdomen is non distended, soft and non tender. Normal  bowel sounds heard. Central nervous system: Alert and oriented x 3. No focal neurological deficits. Extremities: Symmetric 5 x 5 power. Skin: No rashes, lesions or ulcers Psychiatry: Judgement and insight appear normal. Mood & affect appropriate.     Data Reviewed: I have personally reviewed following labs and imaging studies  CBC: Recent Labs  Lab 06/21/23 1039 06/21/23 1047 06/22/23 0612  WBC  --  9.9 9.1  NEUTROABS  --  6.2  --   HGB 12.9 12.3 11.7*  HCT 38.0 39.0 37.2  MCV  --  94.9 93.9   PLT  --  266 247   Basic Metabolic Panel: Recent Labs  Lab 06/21/23 1039 06/21/23 1047 06/22/23 0612  NA 137 138 135  K 4.2 4.2 4.2  CL 100 103 100  CO2  --  27 25  GLUCOSE 101* 103* 96  BUN 14 12 12   CREATININE 1.10* 1.32* 0.84  CALCIUM  --  8.9 8.3*  MG  --  1.9 1.8  PHOS  --  4.1 3.5   GFR: Estimated Creatinine Clearance: 47.5 mL/min (by C-G formula based on SCr of 0.84 mg/dL). Liver Function Tests: Recent Labs  Lab 06/21/23 1047 06/22/23 0612  AST 24 30  ALT 14 16  ALKPHOS 53 49  BILITOT 0.6 0.7  PROT 6.2* 5.6*  ALBUMIN 3.3* 3.0*   No results for input(s): "LIPASE", "AMYLASE" in the last 168 hours. No results for input(s): "AMMONIA" in the last 168 hours. Coagulation Profile: No results for input(s): "INR", "PROTIME" in the last 168 hours. Cardiac Enzymes: Recent Labs  Lab 06/21/23 1047  CKTOTAL 105   BNP (last 3 results) No results for input(s): "PROBNP" in the last 8760 hours. HbA1C: No results for input(s): "HGBA1C" in the last 72 hours. CBG: No results for input(s): "GLUCAP" in the last 168 hours. Lipid Profile: No results for input(s): "CHOL", "HDL", "LDLCALC", "TRIG", "CHOLHDL", "LDLDIRECT" in the last 72 hours. Thyroid Function Tests: Recent Labs    06/21/23 1047  TSH 4.113   Anemia Panel: No results for input(s): "VITAMINB12", "FOLATE", "FERRITIN", "TIBC", "IRON", "RETICCTPCT" in the last 72 hours. Sepsis Labs: No results for input(s): "PROCALCITON", "LATICACIDVEN" in the last 168 hours.  No results found for this or any previous visit (from the past 240 hour(s)).   Radiology Studies: VAS [place] LOWER EXTREMITY VENOUS (DVT)  Result Date: 06/22/2023  Lower Venous DVT Study Patient Name:  Stacey PONTHIEUX  Date of Exam:   06/22/2023 Medical Rec #: [id number]          Accession #:    [id number] Date of Birth: 23-Nov-1940          Patient Gender: F Patient Age:   73 years Exam Location:  [facility] Procedure:      VAS [place] LOWER EXTREMITY  VENOUS (DVT) Referring Phys: Jonny Ruiz DOUTOVA --------------------------------------------------------------------------------  Indications: Edema.  Risk Factors: Recent fall. Comparison Study: Previous exam on 08/09/2017 was negative for DVT Performing Technologist: Ernestene Mention RVT, RDMS  Examination Guidelines: A complete evaluation includes B-mode imaging, spectral Doppler, color Doppler, and power Doppler as needed of all accessible portions of each vessel. Bilateral testing is considered an integral part of a complete examination. Limited examinations for reoccurring indications may be performed as noted. The reflux portion of the exam is performed with the patient in reverse Trendelenburg.  +---------+---------------+---------+-----------+----------+--------------+ RIGHT    CompressibilityPhasicitySpontaneityPropertiesThrombus Aging +---------+---------------+---------+-----------+----------+--------------+ CFV      Full           Yes  Yes                                 +---------+---------------+---------+-----------+----------+--------------+ SFJ      Full                                                        +---------+---------------+---------+-----------+----------+--------------+ FV Prox  Full           Yes      Yes                                 +---------+---------------+---------+-----------+----------+--------------+ FV Mid   Full           Yes      Yes                                 +---------+---------------+---------+-----------+----------+--------------+ FV DistalFull           Yes      Yes                                 +---------+---------------+---------+-----------+----------+--------------+ PFV      Full                                                        +---------+---------------+---------+-----------+----------+--------------+ POP      Full           Yes      Yes                                  +---------+---------------+---------+-----------+----------+--------------+ PTV      Full                                                        +---------+---------------+---------+-----------+----------+--------------+ PERO     Full                                                        +---------+---------------+---------+-----------+----------+--------------+   +----+---------------+---------+-----------+----------+--------------+ LEFTCompressibilityPhasicitySpontaneityPropertiesThrombus Aging +----+---------------+---------+-----------+----------+--------------+ CFV Full           Yes      Yes                                 +----+---------------+---------+-----------+----------+--------------+     Summary: RIGHT: - No evidence of deep vein thrombosis in the lower extremity. No indirect evidence of obstruction proximal to the inguinal ligament.  - No cystic structure found in the popliteal fossa.  LEFT: - No evidence of common femoral vein obstruction.   *See table(s) above for measurements and observations. Electronically signed by Carolynn Sayers on 06/22/2023 at 1:13:41 PM.    Final    CT Head Wo Contrast  Result Date: 06/21/2023 CLINICAL DATA:  Head trauma, minor (Age >= 65y).  Recent fall. EXAM: CT HEAD WITHOUT CONTRAST TECHNIQUE: Contiguous axial images were obtained from the base of the skull through the vertex without intravenous contrast. RADIATION DOSE REDUCTION: This exam was performed according to the departmental dose-optimization program which includes automated exposure control, adjustment of the mA and/or kV according to patient size and/or use of iterative reconstruction technique. COMPARISON:  Head CT 01/01/2022 FINDINGS: Brain: There is no evidence of an acute infarct, intracranial hemorrhage, mass, midline shift, or extra-axial fluid collection. A lacunar infarct in the left caudate head is new but chronic in appearance. Central predominant cerebral atrophy is  unchanged. Vascular: No hyperdense vessel. Skull: No acute fracture or suspicious osseous lesion. Sinuses/Orbits: Minimal mucosal thickening in the right frontal and ethmoid sinuses. Clear mastoid air cells. Bilateral cataract extraction. Other: None. IMPRESSION: 1. No evidence of acute intracranial abnormality. 2. Interval chronic left caudate lacunar infarct. 3. Cerebral atrophy. Electronically Signed   By: Alcide Clever M.D.   On: 06/21/2023 20:17   CT CHEST ABDOMEN PELVIS W CONTRAST  Result Date: 06/21/2023 CLINICAL DATA:  Right rib pain after fall several days ago. EXAM: CT CHEST, ABDOMEN, AND PELVIS WITH CONTRAST TECHNIQUE: Multidetector CT imaging of the chest, abdomen and pelvis was performed following the standard protocol during bolus administration of intravenous contrast. RADIATION DOSE REDUCTION: This exam was performed according to the departmental dose-optimization program which includes automated exposure control, adjustment of the mA and/or kV according to patient size and/or use of iterative reconstruction technique. CONTRAST:  60mL OMNIPAQUE IOHEXOL 350 MG/ML SOLN COMPARISON:  Nov 17, 2022.  March 04, 2020. FINDINGS: CT CHEST FINDINGS Cardiovascular: Atherosclerosis of thoracic aorta is noted without aneurysm or dissection. Mild coronary calcifications are noted. Normal cardiac size. No pericardial effusion. Mediastinum/Nodes: Small sliding-type hiatal hernia. No adenopathy. Thyroid gland is unremarkable. Lungs/Pleura: No pneumothorax or pleural effusion is noted. Stable bilateral interstitial opacities are noted as well as biapical scarring consistent with interstitial lung disease. No acute pulmonary abnormality is noted. Musculoskeletal: Minimally displaced fractures are seen involving the right ninth and tenth ribs. CT ABDOMEN PELVIS FINDINGS Hepatobiliary: No focal liver abnormality is seen. Status post cholecystectomy. No biliary dilatation. Pancreas: Unremarkable. No pancreatic ductal  dilatation or surrounding inflammatory changes. Spleen: Normal in size without focal abnormality. Adrenals/Urinary Tract: Adrenal glands are unremarkable. Kidneys are normal, without renal calculi, focal lesion, or hydronephrosis. Bladder is unremarkable. Stomach/Bowel: The stomach is unremarkable. There is no evidence of bowel obstruction or inflammation. Large amount of stool is noted in the right and transverse colon. Appendix is not clearly visualized. Vascular/Lymphatic: Aortic atherosclerosis. No enlarged abdominal or pelvic lymph nodes. Reproductive: Uterus and bilateral adnexa are unremarkable. Other: No abdominal wall hernia or abnormality. No abdominopelvic ascites. Musculoskeletal: No acute or significant osseous findings. IMPRESSION: Minimally displaced right ninth and tenth rib fractures. Stable bilateral pulmonary findings are noted consistent with biapical scarring and chronic interstitial lung disease. Small sliding-type hiatal hernia. Large amount of stool seen in the colon. Mild coronary artery calcifications are noted. Aortic Atherosclerosis (ICD10-I70.0). Electronically Signed   By: Lupita Raider M.D.   On: 06/21/2023 12:46   DG Ribs Unilateral W/Chest Right  Result Date: 06/21/2023 CLINICAL DATA:  Fall, rib  pain. Large volume stool in the RIGHT colon. EXAM: RIGHT RIBS AND CHEST - 3+ VIEW COMPARISON:  None Available. FINDINGS: Nondisplaced fracture of the RIGHT lateral ninth rib. No pneumothorax or pleural fluid identified. Chronic bronchitic markings in the lungs. Atherosclerotic calcification of the aorta. IMPRESSION: 1. Nondisplaced fracture of the RIGHT lateral ninth rib. 2. No pneumothorax. Electronically Signed   By: Genevive Bi M.D.   On: 06/21/2023 12:10     Scheduled Meds:  amitriptyline  25 mg Oral QHS   docusate sodium  100 mg Oral BID   gabapentin  600 mg Oral QHS   guaiFENesin  600 mg Oral BID   methylPREDNISolone  2 mg Oral QODAY   methylPREDNISolone  4 mg Oral  QODAY   milk and molasses  1 enema Rectal Once   pravastatin  20 mg Oral Daily   senna  1 tablet Oral BID   [START ON 06/23/2023] sulfamethoxazole-trimethoprim  1 tablet Oral Once per day on Monday Wednesday Friday   venlafaxine XR  75 mg Oral QHS   Continuous Infusions:   LOS: 0 days    Time spent: 50 mins    Willeen Niece, MD [facility]   If 7PM-7AM, please contact night-coverage

## 2023-06-22 NOTE — Progress Notes (Signed)
RLE venous duplex has been completed.   Results can be found under chart review under CV PROC. 06/22/2023 12:38 PM Lateia Fraser RVT, RDMS

## 2023-06-22 NOTE — Plan of Care (Signed)
  Problem: Education: Goal: Knowledge of General Education information will improve Description: Including pain rating scale, medication(s)/side effects and non-pharmacologic comfort measures Outcome: Progressing   Problem: Clinical Measurements: Goal: Respiratory complications will improve Outcome: Progressing   Problem: Activity: Goal: Risk for activity intolerance will decrease Outcome: Progressing   Problem: Coping: Goal: Level of anxiety will decrease Outcome: Progressing   Problem: Elimination: Goal: Will not experience complications related to bowel motility Outcome: Progressing Goal: Will not experience complications related to urinary retention Outcome: Progressing   Problem: Pain Management: Goal: General experience of comfort will improve Outcome: Progressing   Problem: Safety: Goal: Ability to remain free from injury will improve Outcome: Progressing

## 2023-06-22 NOTE — TOC Initial Note (Signed)
Transition of Care [facility]) - Initial/Assessment Note    Patient Details  Name: Stacey [name] MRN: [id number] Date of Birth: 08/09/1940  Transition of Care [facility]) CM/SW Contact:    Carley Hammed, LCSW Phone Number: 06/22/2023, 3:41 PM  Clinical Narrative:                  CSW was notified by [facility] that pt was set to move in to their ILF at the beginning of the year. [facility] is offering a SNF bed for pt to assist with transition from the hospital. CSW met with pt who noted understanding and will speak to her son about this. PT to work with pt and CSW will meet with pt again in the AM. TOC will continue to follow.    Barriers to Discharge: Continued Medical Work up, Barriers Unresolved (comment) (3 midnight stay)   Patient Goals and CMS Choice Patient states their goals for this hospitalization and ongoing recovery are:: Pt states she wants to regain independence to move into ILF. CMS Medicare.gov Compare Post Acute Care list provided to:: Patient Choice offered to / list presented to : Patient      Expected Discharge Plan and Services     Post Acute Care Choice: Skilled Nursing Facility Living arrangements for the past 2 months: Single Family Home                                      Prior Living Arrangements/Services Living arrangements for the past 2 months: Single Family Home Lives with:: Self Patient language and need for interpreter reviewed:: Yes Do you feel safe going back to the place where you live?: Yes      Need for Family Participation in Patient Care: Yes (Comment) Care giver support system in place?: Yes (comment)   Criminal Activity/Legal Involvement Pertinent to Current Situation/Hospitalization: No - Comment as needed  Activities of Daily Living   ADL Screening (condition at time of admission) Independently performs ADLs?: Yes (appropriate for developmental age) Is the patient deaf or have difficulty hearing?: No Does the patient have  difficulty seeing, even when wearing glasses/contacts?: No Does the patient have difficulty concentrating, remembering, or making decisions?: No  Permission Sought/Granted Permission sought to share information with : Family Supports Permission granted to share information with : No              Emotional Assessment Appearance:: Appears stated age Attitude/Demeanor/Rapport: Engaged Affect (typically observed): Appropriate, Pleasant Orientation: : Oriented to Self, Oriented to Place, Oriented to  Time, Oriented to Situation Alcohol / Substance Use: Not Applicable Psych Involvement: No (comment)  Admission diagnosis:  Multiple rib fractures [S22.49XA] Closed fracture of multiple ribs of right side, initial encounter [S22.41XA] Patient Active Problem List   Diagnosis Date Noted   Multiple rib fractures 06/22/2023   Rib fracture 06/21/2023   AKI (acute kidney injury) (HCC) 06/21/2023   Debility 06/21/2023   Intractable nausea and vomiting 11/18/2022   Nausea & vomiting 11/17/2022   Chronic cough 06/12/2020   Constipation    CAP (community acquired pneumonia) 02/09/2020   Acute on chronic respiratory failure with hypoxia (HCC) 07/06/2019   Fall at home, initial encounter 07/06/2019   Orthostatic hypotension 07/06/2019   Rib fractures 07/04/2019   Pneumonia due to COVID-19 virus 06/11/2019   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HCC) 05/07/2019   Acute asthma exacerbation 12/18/2018   Elevated troponin 12/18/2018  Acute bronchitis 08/30/2018   Chronic respiratory failure with hypoxia (HCC) 04/06/2018   Essential hypertension 03/24/2018   Depression 03/24/2018   Asthma exacerbation 03/23/2018   DOE (dyspnea on exertion) 03/21/2018   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 esophagitis 03/21/2018   Chronic lung disease    Hypoxia    Dyspnea 01/23/2018   ILD (interstitial lung disease) (HCC) 01/23/2018   Claw toe, acquired, 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 [place] [address] Phone: [phone] Fax: [phone]  [facility] [place], [place] - [address] [place] [address] Phone: [phone] Fax: [phone]  [facility] - [place], [place] - [phone] [place] [address] Phone: [phone] Fax: [phone]  [facility] - [address] [place], [place] - [id number] [place] [address] Phone: [phone] Fax: [phone]     Social Determinants of Health (SDOH) Social History: SDOH Screenings   Food Insecurity: No Food Insecurity (06/21/2023)  Housing: Low Risk  (06/21/2023)  Transportation Needs: No Transportation Needs (06/21/2023)  Utilities: Not At Risk (06/21/2023)  Depression (PHQ2-9): Low Risk  (10/24/2020)  Tobacco Use: Low Risk  (06/22/2023)   SDOH Interventions:     Readmission Risk Interventions     No data to display

## 2023-06-22 NOTE — Evaluation (Signed)
Physical Therapy Evaluation Patient Details Name: Stacey [name] MRN: [id number] DOB: 09/14/1940 Today's Date: 06/22/2023  History of Present Illness  Pt is 82 yo female who presents on 06/21/23 after falling and sustaining R 9-10 rib fxs. Pt also with increased O2 requirement. PMH: ILD, RA, GERD, HTN  Clinical Impression  Pt admitted with above diagnosis. Pt from home alone (husband passed unexpectedly last March) and since that time she has fallen at least 3 times. Pt ambulated 20' with RW and min A for safety, needed seated rest break at 20' and could then return 20'. Pt ambulated on RA and had 2/4 DOE and pulse ox would not ready. First stable reading after ambulation was 87%. 2L O2 reapplied. Patient will benefit from continued inpatient follow up therapy, <3 hours/day.  Pt currently with functional limitations due to the deficits listed below (see PT Problem List). Pt will benefit from acute skilled PT to increase their independence and safety with mobility to allow discharge.           If plan is discharge home, recommend the following: A little help with walking and/or transfers;A little help with bathing/dressing/bathroom;Assistance with cooking/housework;Assist for transportation;Help with stairs or ramp for entrance   Can travel by private vehicle   Yes    Equipment Recommendations None recommended by PT  Recommendations for Other Services       Functional Status Assessment Patient has had a recent decline in their functional status and demonstrates the ability to make significant improvements in function in a reasonable and predictable amount of time.     Precautions / Restrictions Precautions Precautions: Fall Precaution Comments: pt reports at least 3 falls in past 6 mos Restrictions Weight Bearing Restrictions: No      Mobility  Bed Mobility               General bed mobility comments: received in chair. DIscussed how to get up minimizing rib pain     Transfers Overall transfer level: Needs assistance Equipment used: Rolling walker (2 wheels) Transfers: Sit to/from Stand Sit to Stand: Contact guard assist           General transfer comment: vc's for hand placement and pt had some difficulty sequencing this.    Ambulation/Gait Ambulation/Gait assistance: Contact guard assist Gait Distance (Feet): 20 Feet (2x) Assistive device: Rolling walker (2 wheels) Gait Pattern/deviations: Step-through pattern Gait velocity: decreased Gait velocity interpretation: <1.8 ft/sec, indicate of risk for recurrent falls   General Gait Details: pt needed seated rest break at 20'. Noted SOB but pulse ox would not read. SPO2 78% upon return back to room though pleth poor. First number seen with decent waveform was 87% on RA. 2 LO2 reapplied via [place].  Stairs            Wheelchair Mobility     Tilt Bed    Modified Rankin (Stroke Patients Only)       Balance Overall balance assessment: Needs assistance, History of Falls Sitting-balance support: Feet supported, No upper extremity supported Sitting balance-Leahy Scale: Fair     Standing balance support: Bilateral upper extremity supported, During functional activity Standing balance-Leahy Scale: Poor Standing balance comment: unsteady without UE support                             Pertinent Vitals/Pain Pain Assessment Pain Assessment: Faces Faces Pain Scale: Hurts even more Pain Location: R ribs Pain Descriptors / Indicators: Aching Pain  Intervention(s): Limited activity within patient's tolerance, Monitored during session    Home Living Family/patient expects to be discharged to:: Skilled nursing facility Living Arrangements: Alone                 Additional Comments: pt was planning to move into [facility] in Jan. Can go early to SNF there.    Prior Function Prior Level of Function : Independent/Modified Independent             Mobility  Comments: ambulates with rollator. Reports multiple falls since her husband passed suddently 9 mos ago.       Extremity/Trunk Assessment   Upper Extremity Assessment Upper Extremity Assessment: Generalized weakness    Lower Extremity Assessment Lower Extremity Assessment: Generalized weakness    Cervical / Trunk Assessment Cervical / Trunk Assessment: Normal  Communication   Communication Communication: Difficulty communicating thoughts/reduced clarity of speech Cueing Techniques: Verbal cues  Cognition Arousal: Alert Behavior During Therapy: WFL for tasks assessed/performed Overall Cognitive Status: No family/caregiver present to determine baseline cognitive functioning                                 General Comments: occasional word finding diffciulties and some sequencing delays. Unsure if this is her baseline        General Comments General comments (skin integrity, edema, etc.): had pt practice IS, could only pull 700. Pt was not on O2 PTA.    Exercises     Assessment/Plan    PT Assessment Patient needs continued PT services  PT Problem List Decreased activity tolerance;Decreased balance;Decreased mobility;Decreased coordination;Decreased cognition;Decreased strength;Decreased safety awareness;Decreased knowledge of precautions;Decreased knowledge of use of DME;Cardiopulmonary status limiting activity       PT Treatment Interventions DME instruction;Functional mobility training;Therapeutic activities;Gait training;Therapeutic exercise;Balance training;Patient/family education;Cognitive remediation    PT Goals (Current goals can be found in the Care Plan section)  Acute Rehab PT Goals Patient Stated Goal: thinking about whether she is ok with going to [facility] now PT Goal Formulation: With patient Time For Goal Achievement: 07/06/23 Potential to Achieve Goals: Good    Frequency Min 1X/week     Co-evaluation               AM-PAC PT  "6 Clicks" Mobility  Outcome Measure Help needed turning from your back to your side while in a flat bed without using bedrails?: A Little Help needed moving from lying on your back to sitting on the side of a flat bed without using bedrails?: A Little Help needed moving to and from a bed to a chair (including a wheelchair)?: A Little Help needed standing up from a chair using your arms (e.g., wheelchair or bedside chair)?: A Little Help needed to walk in hospital room?: A Little Help needed climbing 3-5 steps with a railing? : A Lot 6 Click Score: 17    End of Session Equipment Utilized During Treatment: Gait belt;Oxygen Activity Tolerance: Patient tolerated treatment well Patient left: in chair;with call bell/phone within reach Nurse Communication: Mobility status PT Visit Diagnosis: Unsteadiness on feet (R26.81);Pain;Repeated falls (R29.6) Pain - Right/Left: Right Pain - part of body:  (ribs)    Time: 4403-4742 PT Time Calculation (min) (ACUTE ONLY): 32 min   Charges:   PT Evaluation $PT Eval Moderate Complexity: 1 Mod PT Treatments $Gait Training: 8-22 mins PT General Charges $$ ACUTE PT VISIT: 1 Visit  Lyanne Co, PT  Acute Rehab Services Secure chat preferred Office [phone]   Elyse Hsu 06/22/2023, 5:19 PM

## 2023-06-23 DIAGNOSIS — J9621 Acute and chronic respiratory failure with hypoxia: Secondary | ICD-10-CM | POA: Diagnosis not present

## 2023-06-23 DIAGNOSIS — E43 Unspecified severe protein-calorie malnutrition: Secondary | ICD-10-CM

## 2023-06-23 DIAGNOSIS — S2231XD Fracture of one rib, right side, subsequent encounter for fracture with routine healing: Secondary | ICD-10-CM

## 2023-06-23 DIAGNOSIS — M069 Rheumatoid arthritis, unspecified: Secondary | ICD-10-CM | POA: Diagnosis not present

## 2023-06-23 DIAGNOSIS — I10 Essential (primary) hypertension: Secondary | ICD-10-CM | POA: Diagnosis not present

## 2023-06-23 MED ORDER — TRAMADOL HCL 50 MG PO TABS
50.0000 mg | ORAL_TABLET | Freq: Two times a day (BID) | ORAL | Status: DC | PRN
  Administered 2023-06-23 – 2023-06-25 (×4): 50 mg via ORAL
  Filled 2023-06-23 (×4): qty 1

## 2023-06-23 MED ORDER — ADULT MULTIVITAMIN W/MINERALS CH
1.0000 | ORAL_TABLET | Freq: Every day | ORAL | Status: DC
  Administered 2023-06-23 – 2023-06-25 (×3): 1 via ORAL
  Filled 2023-06-23 (×3): qty 1

## 2023-06-23 MED ORDER — PROSOURCE PLUS PO LIQD
30.0000 mL | Freq: Two times a day (BID) | ORAL | Status: DC
  Administered 2023-06-23: 30 mL via ORAL
  Filled 2023-06-23 (×2): qty 30

## 2023-06-23 MED ORDER — ENSURE ENLIVE PO LIQD
237.0000 mL | Freq: Two times a day (BID) | ORAL | Status: DC
  Administered 2023-06-23: 237 mL via ORAL

## 2023-06-23 MED ORDER — HYDROCODONE-ACETAMINOPHEN 5-325 MG PO TABS
1.0000 | ORAL_TABLET | ORAL | Status: DC | PRN
  Administered 2023-06-23: 1 via ORAL
  Filled 2023-06-23: qty 1

## 2023-06-23 MED ORDER — FENTANYL CITRATE PF 50 MCG/ML IJ SOSY: 12.5000 ug | PREFILLED_SYRINGE | INTRAMUSCULAR | Status: DC | PRN

## 2023-06-23 MED ORDER — KETOROLAC TROMETHAMINE 10 MG PO TABS: 10.0000 mg | ORAL_TABLET | Freq: Three times a day (TID) | ORAL | Status: DC | PRN

## 2023-06-23 NOTE — Progress Notes (Addendum)
Initial Nutrition Assessment  DOCUMENTATION CODES:   Severe malnutrition in context of acute illness/injury  INTERVENTION:   - Liberalize diet to Promote PO intake - Provided high protein, high calorie handout  - Prosource Plus 30 mL BID, each supplement provides 100 kcal and 15 gm protein  - Ensure Enlive po BID, each supplement provides 350 kcal and 20 grams of protein. - MVI daily   NUTRITION DIAGNOSIS:   Severe Malnutrition related to acute illness as evidenced by severe muscle depletion, severe fat depletion.   GOAL:   Patient will meet greater than or equal to 90% of their needs   MONITOR:   PO intake, Supplement acceptance, I & O's  REASON FOR ASSESSMENT:   Consult Assessment of nutrition requirement/status  ASSESSMENT:  82 y.o F with PMH of low iron, RA, osteoporosis, HTN, HLD, CHF, interstitial lung disease, GERD, cholecystectomy. Presented with rib fracture.  PTA pt states she had an appetite and was eating 3 meals/day. Pt lives at home and is ambulatory.  Upon dietary recall, pt was eating very little. For breakfast she would have cereal with granola, orange juice and coffee. Lunch would usually be a greek yogurt, and dinner would be a soup she made at home. She does drink smoothies, coconut water, and sometimes an Ensure.   Pt reports her UBW to be around 130 lbs and denies weight loss.Per weight histroy pt's weight is stable.  She has not had a real bowel movement since last week but did say she had a small BM yesterday. She says at baseline she is constipated and takes mira lax at home as needed.   Discussed with patient the importance of protein during this time to help her fracture heal and provided ways to increase protein intake. At baseline pt does not consume a lot of protein, encouraged pt to order a protein at every meal. Pt was willing to try Ensures and Prosource.   Admit weight: 59.8 kg  Current weight: 59.8 kg   06/21/23 59.8 kg  03/12/23 61.3  kg  03/02/23 60.9 kg  12/11/22 58.2 kg  11/17/22 59 kg  10/21/22 59 kg  07/28/22 60.8 kg  07/24/22 60.6 kg    Average Meal Intake: 12/10-12/10: 25% intake x 2 recorded meals  Nutritionally Relevant Medications: Scheduled Meds:  (feeding supplement) PROSource Plus  30 mL Oral BID BM   amitriptyline  25 mg Oral QHS   docusate sodium  100 mg Oral BID   feeding supplement  237 mL Oral BID BM   gabapentin  600 mg Oral QHS   guaiFENesin  600 mg Oral BID   methylPREDNISolone  2 mg Oral QODAY   methylPREDNISolone  4 mg Oral QODAY   milk and molasses  1 enema Rectal Once   multivitamin with minerals  1 tablet Oral Daily   pravastatin  20 mg Oral Daily   senna  1 tablet Oral BID   sulfamethoxazole-trimethoprim  1 tablet Oral Once per day on Monday Wednesday Friday   venlafaxine XR  75 mg Oral QHS   Labs Reviewed: Calcium 8.3, cholesterol 222, LDL 137   NUTRITION - FOCUSED PHYSICAL EXAM:  Flowsheet Row Most Recent Value  Orbital Region Severe depletion  Upper Arm Region Moderate depletion  Thoracic and Lumbar Region Unable to assess  [Rib fx]  Buccal Region Severe depletion  Temple Region Severe depletion  Clavicle Bone Region Severe depletion  Clavicle and Acromion Bone Region Severe depletion  Scapular Bone Region Severe depletion  Dorsal  Hand Severe depletion  Patellar Region Moderate depletion  Anterior Thigh Region Moderate depletion  Posterior Calf Region Unable to assess  [Wrapped]  Edema (RD Assessment) None  Hair Reviewed  Eyes Reviewed  Mouth Reviewed  Skin Reviewed  Nails Reviewed       Diet Order:   Diet Order             Diet regular Room service appropriate? Yes; Fluid consistency: Thin  Diet effective now                   EDUCATION NEEDS:   Education needs have been addressed  Skin:  Skin Assessment: Reviewed RN Assessment  Last BM:  12/10, type 1, small  Height:   Ht Readings from Last 1 Encounters:  06/21/23 5\' 6"  (1.676 m)     Weight:   Wt Readings from Last 1 Encounters:  06/21/23 59.8 kg    Ideal Body Weight:  59.1 kg  BMI:  Body mass index is 21.28 kg/m.  Estimated Nutritional Needs:   Kcal:  1600-1800  Protein:  80-100 gm  Fluid:  >1.6L   Elliot Dally, RD Registered Dietitian  See Amion for more information

## 2023-06-23 NOTE — Plan of Care (Signed)
  Problem: Education: Goal: Knowledge of General Education information will improve Description: Including pain rating scale, medication(s)/side effects and non-pharmacologic comfort measures Outcome: Progressing   Problem: Health Behavior/Discharge Planning: Goal: Ability to manage health-related needs will improve Outcome: Progressing   Problem: Activity: Goal: Risk for activity intolerance will decrease Outcome: Progressing   Problem: Nutrition: Goal: Adequate nutrition will be maintained Outcome: Progressing   Problem: Coping: Goal: Level of anxiety will decrease Outcome: Progressing   Problem: Elimination: Goal: Will not experience complications related to bowel motility Outcome: Progressing Goal: Will not experience complications related to urinary retention Outcome: Progressing   Problem: Pain Management: Goal: General experience of comfort will improve Outcome: Progressing   Problem: Safety: Goal: Ability to remain free from injury will improve Outcome: Progressing

## 2023-06-23 NOTE — Progress Notes (Signed)
[facility]                                                                               Stacey [name], is a 82 y.o. female, DOB - 14-Nov-1940, ZDG:[id number] Admit date - 06/21/2023    Outpatient Primary MD for the patient is [name], MD  LOS - 1  days    Brief summary   82 yrs old female with PMH significant for Interstitial lung disease, rheumatoid arthritis, GERD presented in the ED with complaints of right-sided rib pain for last few days.  Patient reports she had a fall in the bathroom while trying to stand up from a toilet and hit the right side of her chest, she does not recall hitting her head, denies any loss of consciousness.  She has been walking since her fall.  She was found to be hypoxic on arrival with SpO2 of 86% on room air requiring 2 L of supplemental oxygen.  She is admitted for further evaluation.     Assessment & Plan    Assessment and Plan:  Closed fracture of the 9 and 10 th ribs ont he right side.  S/p mechanical fall.  Pain control, continue with IS.  Therapy evaluations.    Acute respiratory failure with hypoxia  Resolved.    Hypertension Well controlled.   RA Stable.    AKI Resolved.    Debility Therapy eval recommending SNF.        RN Pressure Injury Documentation:    Malnutrition Type:  Nutrition Problem: Severe Malnutrition Etiology: acute illness   Malnutrition Characteristics:  Signs/Symptoms: severe muscle depletion, severe fat depletion   Nutrition Interventions:  Interventions: Ensure Enlive (each supplement provides 350kcal and 20 grams of protein), Prostat  Estimated body mass index is 21.28 kg/m as calculated from the following:   Height as of this encounter: 5\' 6"  (1.676 m).   Weight as of this encounter: 59.8 kg.  Code Status: full code.  DVT Prophylaxis:  SCDs Start: 06/21/23 2138   Level of Care: Level of care: Telemetry Medical Family Communication: family at bedside.    Disposition Plan:     Remains inpatient appropriate:  pain control.   Procedures:  None.   Consultants:   None.  Antimicrobials:   Anti-infectives (From admission, onward)    Start     Dose/Rate Route Frequency Ordered Stop   06/23/23 0900  sulfamethoxazole-trimethoprim (BACTRIM DS) 800-160 MG per tablet 1 tablet        1 tablet Oral Once per day on Monday Wednesday Friday 06/22/23 0948     06/21/23 2200  sulfamethoxazole-trimethoprim (BACTRIM DS) 800-160 MG per tablet 1 tablet  Status:  Discontinued        1 tablet Oral 2 times daily 06/21/23 2137 06/22/23 0948        Medications  Scheduled Meds:  (feeding supplement) PROSource Plus  30 mL Oral BID BM   amitriptyline  25 mg Oral QHS   docusate sodium  100 mg Oral BID   feeding supplement  237 mL Oral BID BM   gabapentin  600 mg Oral QHS   guaiFENesin  600 mg Oral BID   methylPREDNISolone  2 mg Oral QODAY   methylPREDNISolone  4 mg Oral QODAY   milk and molasses  1 enema Rectal Once   multivitamin with minerals  1 tablet Oral Daily   pravastatin  20 mg Oral Daily   senna  1 tablet Oral BID   sulfamethoxazole-trimethoprim  1 tablet Oral Once per day on Monday Wednesday Friday   venlafaxine XR  75 mg Oral QHS   Continuous Infusions: PRN Meds:.bisacodyl, fentaNYL (SUBLIMAZE) injection, HYDROcodone-acetaminophen, ipratropium-albuterol, ketorolac, methocarbamol (ROBAXIN) injection, ondansetron **OR** ondansetron (ZOFRAN) IV, polyethylene glycol, traMADol    Subjective:   Joesphine Fuchs was seen and examined today.  Improving pain, but not resolved.   Objective:   Vitals:   06/22/23 2112 06/23/23 0346 06/23/23 0749 06/23/23 1621  BP: (!) 148/81 (!) 137/59 (!) 152/67 (!) 156/64  Pulse: 78 66 75 91  Resp: 20 17 17 17   Temp: 98.3 F (36.8 C) 97.9 F (36.6 C) 98 F (36.7 C) 98.3 F (36.8 C)  TempSrc: Oral Oral Oral Oral  SpO2: 100% 95% 92% 96%  Weight:      Height:        Intake/Output Summary (Last 24  hours) at 06/23/2023 1643 Last data filed at 06/23/2023 1500 Gross per 24 hour  Intake 480 ml  Output 300 ml  Net 180 ml   Filed Weights   06/21/23 1007 06/21/23 2255  Weight: 59 kg 59.8 kg     Exam General: Alert and oriented x 3, NAD Cardiovascular: S1 S2 auscultated, no murmurs, RRR Respiratory: scattered wheezing heard. On RA. Gastrointestinal: Soft, nontender, nondistended, + bowel sounds Ext: no pedal edema bilaterally Neuro: AAOx3, Cr N's II- XII. Strength 5/5 upper and lower extremities bilaterally Skin: No rashes Psych: Normal affect and demeanor, alert and oriented x3    Data Reviewed:  I have personally reviewed following labs and imaging studies   CBC Lab Results  Component Value Date   WBC 9.1 06/22/2023   RBC 3.96 06/22/2023   HGB 11.7 (L) 06/22/2023   HCT 37.2 06/22/2023   MCV 93.9 06/22/2023   MCH 29.5 06/22/2023   PLT 247 06/22/2023   MCHC 31.5 06/22/2023   RDW 13.8 06/22/2023   LYMPHSABS 2.1 06/21/2023   MONOABS 1.3 (H) 06/21/2023   EOSABS 0.3 06/21/2023   BASOSABS 0.1 06/21/2023     Last metabolic panel Lab Results  Component Value Date   NA 135 06/22/2023   K 4.2 06/22/2023   CL 100 06/22/2023   CO2 25 06/22/2023   BUN 12 06/22/2023   CREATININE 0.84 06/22/2023   GLUCOSE 96 06/22/2023   GFRNONAA >60 06/22/2023   GFRAA >60 02/15/2020   CALCIUM 8.3 (L) 06/22/2023   PHOS 3.5 06/22/2023   PROT 5.6 (L) 06/22/2023   ALBUMIN 3.0 (L) 06/22/2023   BILITOT 0.7 06/22/2023   ALKPHOS 49 06/22/2023   AST 30 06/22/2023   ALT 16 06/22/2023   ANIONGAP 10 06/22/2023    CBG (last 3)  No results for input(s): "GLUCAP" in the last 72 hours.    Coagulation Profile: No results for input(s): "INR", "PROTIME" in the last 168 hours.   Radiology Studies: VAS [place] LOWER EXTREMITY VENOUS (DVT)  Result Date: 06/22/2023  Lower Venous DVT Study Patient Name:  Stacey [name]  Date of Exam:   06/22/2023 Medical Rec #: [id number]          Accession #:     [id number] Date of Birth: [birth year]  Patient Gender: F Patient Age:   1 years Exam Location:  [facility] Procedure:      VAS [place] LOWER EXTREMITY VENOUS (DVT) Referring Phys: Jonny Ruiz DOUTOVA --------------------------------------------------------------------------------  Indications: Edema.  Risk Factors: Recent fall. Comparison Study: Previous exam on 08/09/2017 was negative for DVT Performing Technologist: Ernestene Mention RVT, RDMS  Examination Guidelines: A complete evaluation includes B-mode imaging, spectral Doppler, color Doppler, and power Doppler as needed of all accessible portions of each vessel. Bilateral testing is considered an integral part of a complete examination. Limited examinations for reoccurring indications may be performed as noted. The reflux portion of the exam is performed with the patient in reverse Trendelenburg.  +---------+---------------+---------+-----------+----------+--------------+ RIGHT    CompressibilityPhasicitySpontaneityPropertiesThrombus Aging +---------+---------------+---------+-----------+----------+--------------+ CFV      Full           Yes      Yes                                 +---------+---------------+---------+-----------+----------+--------------+ SFJ      Full                                                        +---------+---------------+---------+-----------+----------+--------------+ FV Prox  Full           Yes      Yes                                 +---------+---------------+---------+-----------+----------+--------------+ FV Mid   Full           Yes      Yes                                 +---------+---------------+---------+-----------+----------+--------------+ FV DistalFull           Yes      Yes                                 +---------+---------------+---------+-----------+----------+--------------+ PFV      Full                                                         +---------+---------------+---------+-----------+----------+--------------+ POP      Full           Yes      Yes                                 +---------+---------------+---------+-----------+----------+--------------+ PTV      Full                                                        +---------+---------------+---------+-----------+----------+--------------+ PERO     Full                                                        +---------+---------------+---------+-----------+----------+--------------+   +----+---------------+---------+-----------+----------+--------------+  LEFTCompressibilityPhasicitySpontaneityPropertiesThrombus Aging +----+---------------+---------+-----------+----------+--------------+ CFV Full           Yes      Yes                                 +----+---------------+---------+-----------+----------+--------------+     Summary: RIGHT: - No evidence of deep vein thrombosis in the lower extremity. No indirect evidence of obstruction proximal to the inguinal ligament.  - No cystic structure found in the popliteal fossa.  LEFT: - No evidence of common femoral vein obstruction.   *See table(s) above for measurements and observations. Electronically signed by Carolynn Sayers on 06/22/2023 at 1:13:41 PM.    Final        Kathlen Mody M.D. [facility] 06/23/2023, 4:43 PM  Available via Epic secure chat 7am-7pm After 7 pm, please refer to night coverage provider listed on amion.

## 2023-06-23 NOTE — Evaluation (Signed)
Occupational Therapy Evaluation Patient Details Name: Stacey [name] MRN: [id number] DOB: 1941-01-26 Today's Date: 06/23/2023   History of Present Illness 82 yo female who presents on 06/21/23 after falling and sustaining R 9-10 rib fxs. Pt also with increased O2 requirement. PMH: ILD, RA, GERD, HTN   Clinical Impression   PT admitted with rib fxs. Pt currently with functional limitiations due to the deficits listed below (see OT problem list). Pt at baseline with multiple falls but managed adls using 4WW. Pt at this time needs increased time and educated on pursed lip breathing. Pt able to demonstrate [facility] transfer with peri care. Pt ordering lunch.  Pt will benefit from skilled OT to increase their independence and safety with adls and balance to allow discharge skilled inpatient follow up therapy, <3 hours/day. .        If plan is discharge home, recommend the following: A little help with walking and/or transfers;A little help with bathing/dressing/bathroom    Functional Status Assessment  Patient has had a recent decline in their functional status and demonstrates the ability to make significant improvements in function in a reasonable and predictable amount of time.  Equipment Recommendations  Other (comment);BSC/3in1 (RW)    Recommendations for Other Services       Precautions / Restrictions Precautions Precautions: Fall Precaution Comments: pt reports at least 3 falls in past 6 mos Restrictions Weight Bearing Restrictions: No      Mobility Bed Mobility Overal bed mobility: Needs Assistance Bed Mobility: Supine to Sit     Supine to sit: Supervision     General bed mobility comments: heavy use of bedrail and hob 40 degrees    Transfers Overall transfer level: Needs assistance Equipment used: Rolling walker (2 wheels) Transfers: Sit to/from Stand Sit to Stand: Contact guard assist           General transfer comment: vc's for hand placement and pt had  some difficulty sequencing this.      Balance Overall balance assessment: Needs assistance, History of Falls Sitting-balance support: Feet supported, No upper extremity supported Sitting balance-Leahy Scale: Fair     Standing balance support: Bilateral upper extremity supported, During functional activity Standing balance-Leahy Scale: Poor Standing balance comment: unsteady without UE support                           ADL either performed or assessed with clinical judgement   ADL                                               Vision Baseline Vision/History: 0 No visual deficits       Perception         Praxis         Pertinent Vitals/Pain Pain Assessment Pain Assessment: Faces Faces Pain Scale: Hurts a little bit Pain Location: R ribs Pain Descriptors / Indicators: Aching Pain Intervention(s): Premedicated before session, Monitored during session     Extremity/Trunk Assessment Upper Extremity Assessment Upper Extremity Assessment: Generalized weakness   Lower Extremity Assessment Lower Extremity Assessment: Generalized weakness   Cervical / Trunk Assessment Cervical / Trunk Assessment: Kyphotic   Communication Communication Communication: Difficulty communicating thoughts/reduced clarity of speech   Cognition Arousal: Alert Behavior During Therapy: WFL for tasks assessed/performed Overall Cognitive Status: No family/caregiver present to determine baseline cognitive functioning  General Comments: pt demonstrates some STM and word finding delays. pt able to communicate needs. pt reading a handout on nutrional food choices and then ordering pudding and icy for lunch.     General Comments  pt reports worry about lungs and wanting to see her doctor while acute if possible. Pt once up and moving with decreased wheezing sounds.    Exercises     Shoulder Instructions      Home Living  Family/patient expects to be discharged to:: Skilled nursing facility Living Arrangements: Alone                               Additional Comments: pt was planning to move into [facility] ILF in Jan. Can go early to SNF there.      Prior Functioning/Environment Prior Level of Function : Independent/Modified Independent             Mobility Comments: ambulates with rollator. Reports multiple falls since her husband passed suddently 9 mos ago. ADLs Comments: independent with ADLs, has not been driving due to vision        OT Problem List: Decreased strength;Decreased activity tolerance;Impaired balance (sitting and/or standing);Decreased knowledge of precautions;Decreased knowledge of use of DME or AE;Decreased safety awareness;Cardiopulmonary status limiting activity      OT Treatment/Interventions: Self-care/ADL training;Therapeutic exercise;Energy conservation;DME and/or AE instruction;Manual therapy;Therapeutic activities;Cognitive remediation/compensation;Patient/family education;Balance training    OT Goals(Current goals can be found in the care plan section) Acute Rehab OT Goals Patient Stated Goal: to get more therapy OT Goal Formulation: With patient Time For Goal Achievement: 07/07/23 Potential to Achieve Goals: Good  OT Frequency: Min 1X/week    Co-evaluation              AM-PAC OT "6 Clicks" Daily Activity     Outcome Measure Help from another person eating meals?: A Little Help from another person taking care of personal grooming?: A Little Help from another person toileting, which includes using toliet, bedpan, or urinal?: A Little Help from another person bathing (including washing, rinsing, drying)?: A Little Help from another person to put on and taking off regular upper body clothing?: A Little Help from another person to put on and taking off regular lower body clothing?: A Lot 6 Click Score: 17   End of Session Equipment Utilized During  Treatment: Rolling walker (2 wheels) Nurse Communication: Mobility status;Precautions  Activity Tolerance: Patient tolerated treatment well Patient left: in chair;with call bell/phone within reach;with chair alarm set;with nursing/sitter in room  OT Visit Diagnosis: Unsteadiness on feet (R26.81);Muscle weakness (generalized) (M62.81)                Time: 1610-9604 OT Time Calculation (min): 23 min Charges:  OT General Charges $OT Visit: 1 Visit OT Evaluation $OT Eval Moderate Complexity: 1 Mod   Stacey [name], OTR/L  Acute Rehabilitation Services Office: [phone] .   Stacey [name] 06/23/2023, 4:06 PM

## 2023-06-23 NOTE — Progress Notes (Signed)
PT Cancellation Note  Patient Details Name: Stacey [name] MRN: [id number] DOB: Apr 27, 1941   Cancelled Treatment:    Reason Eval/Treat Not Completed: Other (comment) (Patient working with OT at this time. PT will continue with attempts)  Donna Bernard, PT, MPT  Ina Homes 06/23/2023, 2:22 PM

## 2023-06-23 NOTE — [place] FL2 (Signed)
[place] MEDICAID FL2 LEVEL OF CARE FORM     IDENTIFICATION  Patient Name: Stacey [name] Birthdate: 08-05-1940 Sex: female Admission Date (Current Location): 06/21/2023  [facility] and [place] Number:  Producer, television/film/video and Address:  [facility] [place], [address] [place], [place] [address]      Provider Number: [id number]  Attending Physician Name and Address:  Kathlen Mody, MD  Relative Name and Phone Number:       Current Level of Care: Hospital Recommended Level of Care: Skilled Nursing Facility Prior Approval Number:    Date Approved/Denied:   PASRR Number: [id number]  Discharge Plan: SNF    Current Diagnoses: Patient Active Problem List   Diagnosis Date Noted   Multiple rib fractures 06/22/2023   Rib fracture 06/21/2023   AKI (acute kidney injury) (HCC) 06/21/2023   Debility 06/21/2023   Intractable nausea and vomiting 11/18/2022   Nausea & vomiting 11/17/2022   Chronic cough 06/12/2020   Constipation    CAP (community acquired pneumonia) 02/09/2020   Acute on chronic respiratory failure with hypoxia (HCC) 07/06/2019   Fall at home, initial encounter 07/06/2019   Orthostatic hypotension 07/06/2019   Rib fractures 07/04/2019   Pneumonia due to COVID-19 virus 06/11/2019   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HCC) 05/07/2019   Acute asthma exacerbation 12/18/2018   Elevated troponin 12/18/2018   Acute bronchitis 08/30/2018   Chronic respiratory failure with hypoxia (HCC) 04/06/2018   Essential hypertension 03/24/2018   Depression 03/24/2018   Asthma exacerbation 03/23/2018   DOE (dyspnea on exertion) 03/21/2018   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 esophagitis 03/21/2018   Chronic lung disease    Hypoxia    Dyspnea 01/23/2018   ILD (interstitial lung disease) (HCC) 01/23/2018   Claw toe, acquired, 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   Restrictive lung disease 07/18/2013   Multiple pulmonary nodules/RA lung dz  05/29/2013   Cough variant asthma vs uacs  05/29/2013    Rheumatoid arthritis (HCC) 01/13/2012   Osteopenia 01/13/2012   Lichen sclerosus et atrophicus of the vulva 01/13/2012   Asthma 01/13/2012    Orientation RESPIRATION BLADDER Height & Weight     Self, Time, Situation, Place  O2 ([place] 2L) Continent Weight: 131 lb 13.4 oz (59.8 kg) Height:  5\' 6"  (167.6 cm)  BEHAVIORAL SYMPTOMS/MOOD NEUROLOGICAL BOWEL NUTRITION STATUS      Incontinent Diet (See DC summary)  AMBULATORY STATUS COMMUNICATION OF NEEDS Skin   Limited Assist Verbally Normal                       Personal Care Assistance Level of Assistance  Bathing, Feeding, Dressing Bathing Assistance: Limited assistance Feeding assistance: Independent Dressing Assistance: Limited assistance     Functional Limitations Info  Sight, Hearing, Speech Sight Info: Adequate Hearing Info: Adequate Speech Info: Adequate    SPECIAL CARE FACTORS FREQUENCY  PT (By licensed PT), OT (By licensed OT)     PT Frequency: 5x week OT Frequency: 5x week            Contractures Contractures Info: Not present    Additional Factors Info  Code Status, Allergies, Psychotropic Code Status Info: Full Allergies Info: Penicillins  Remicade (Infliximab) Psychotropic Info: Venlafaxine XR         Current Medications (06/23/2023):  This is the current hospital active medication list Current Facility-Administered Medications  Medication Dose Route Frequency Provider Last Rate Last Admin   (feeding supplement) PROSource Plus liquid 30 mL  30 mL Oral BID BM Kathlen Mody,  MD   30 mL at 06/23/23 1117   amitriptyline (ELAVIL) tablet 25 mg  25 mg Oral QHS Therisa Doyne, MD   25 mg at 06/22/23 2028   bisacodyl (DULCOLAX) suppository 10 mg  10 mg Rectal Daily PRN Therisa Doyne, MD   10 mg at 06/22/23 0542   docusate sodium (COLACE) capsule 100 mg  100 mg Oral BID Therisa Doyne, MD   100 mg at 06/23/23 0816   feeding supplement (ENSURE ENLIVE / ENSURE PLUS) liquid 237 mL  237 mL Oral BID  BM Kathlen Mody, MD   237 mL at 06/23/23 1116   fentaNYL (SUBLIMAZE) injection 12.5-50 mcg  12.5-50 mcg Intravenous Q2H PRN Kathlen Mody, MD       gabapentin (NEURONTIN) capsule 600 mg  600 mg Oral QHS Therisa Doyne, MD   600 mg at 06/22/23 2028   guaiFENesin (MUCINEX) 12 hr tablet 600 mg  600 mg Oral BID Therisa Doyne, MD   600 mg at 06/23/23 0816   HYDROcodone-acetaminophen (NORCO/VICODIN) 5-325 MG per tablet 1-2 tablet  1-2 tablet Oral Q4H PRN Kathlen Mody, MD       ipratropium-albuterol (DUONEB) 0.5-2.5 (3) MG/3ML nebulizer solution 3 mL  3 mL Nebulization Q6H PRN Willeen Niece, MD       ketorolac (TORADOL) tablet 10 mg  10 mg Oral Q8H PRN Kathlen Mody, MD       methocarbamol (ROBAXIN) injection 500 mg  500 mg Intravenous Q6H PRN Doutova, Anastassia, MD       methylPREDNISolone (MEDROL) tablet 2 mg  2 mg Oral Marca Ancona, Anastassia, MD   2 mg at 06/23/23 0817   methylPREDNISolone (MEDROL) tablet 4 mg  4 mg Oral QODAY Doutova, Jonny Ruiz, MD   4 mg at 06/22/23 1035   milk and molasses enema  1 enema Rectal Once Therisa Doyne, MD       multivitamin with minerals tablet 1 tablet  1 tablet Oral Daily Kathlen Mody, MD       ondansetron (ZOFRAN) tablet 4 mg  4 mg Oral Q6H PRN Doutova, Anastassia, MD       Or   ondansetron (ZOFRAN) injection 4 mg  4 mg Intravenous Q6H PRN Doutova, Anastassia, MD       polyethylene glycol (MIRALAX / GLYCOLAX) packet 17 g  17 g Oral Daily PRN Doutova, Anastassia, MD   17 g at 06/23/23 0817   pravastatin (PRAVACHOL) tablet 20 mg  20 mg Oral Daily Doutova, Anastassia, MD   20 mg at 06/23/23 0816   senna (SENOKOT) tablet 8.6 mg  1 tablet Oral BID Therisa Doyne, MD   8.6 mg at 06/23/23 0816   sulfamethoxazole-trimethoprim (BACTRIM DS) 800-160 MG per tablet 1 tablet  1 tablet Oral Once per day on Monday Wednesday Friday Willeen Niece, MD   1 tablet at 06/23/23 0816   traMADol (ULTRAM) tablet 50 mg  50 mg Oral Q12H PRN Kathlen Mody, MD        venlafaxine XR (EFFEXOR-XR) 24 hr capsule 75 mg  75 mg Oral QHS Therisa Doyne, MD   75 mg at 06/22/23 2028     Discharge Medications: Please see discharge summary for a list of discharge medications.  Relevant Imaging Results:  Relevant Lab Results:   Additional Information SS# [id number]  Carley Hammed, LCSW

## 2023-06-23 NOTE — TOC Progression Note (Signed)
Transition of Care [facility]) - Progression Note    Patient Details  Name: AMEYALLI ELICKER MRN: [id number] Date of Birth: 1940-08-01  [facility] CM/SW Contact  Carley Hammed, LCSW Phone Number: 06/23/2023, 1:31 PM  Clinical Narrative:    CSW noting PT has recommended SNF for pt and met with pt at bedside. Pt confirms she is interested and agreeable to [facility] and then transition to ILF. Pt notes her son can take her to the facility. Pt advised of the three midnight stay, with a possible DC date of Friday, pt noted understanding. FL2 completed and sent to facility. TOC will continue to follow.      Barriers to Discharge: Continued Medical Work up, Barriers Unresolved (comment) (3 midnight stay)  Expected Discharge Plan and Services     Post Acute Care Choice: Skilled Nursing Facility Living arrangements for the past 2 months: Single Family Home                                       Social Determinants of Health (SDOH) Interventions SDOH Screenings   Food Insecurity: No Food Insecurity (06/21/2023)  Housing: Low Risk  (06/21/2023)  Transportation Needs: No Transportation Needs (06/21/2023)  Utilities: Not At Risk (06/21/2023)  Depression (PHQ2-9): Low Risk  (10/24/2020)  Tobacco Use: Low Risk  (06/22/2023)    Readmission Risk Interventions     No data to display

## 2023-06-24 ENCOUNTER — Inpatient Hospital Stay (HOSPITAL_COMMUNITY): Payer: Medicare Other

## 2023-06-24 DIAGNOSIS — S2231XD Fracture of one rib, right side, subsequent encounter for fracture with routine healing: Secondary | ICD-10-CM | POA: Diagnosis not present

## 2023-06-24 DIAGNOSIS — J9621 Acute and chronic respiratory failure with hypoxia: Secondary | ICD-10-CM | POA: Diagnosis not present

## 2023-06-24 DIAGNOSIS — N179 Acute kidney failure, unspecified: Secondary | ICD-10-CM | POA: Diagnosis not present

## 2023-06-24 DIAGNOSIS — I10 Essential (primary) hypertension: Secondary | ICD-10-CM | POA: Diagnosis not present

## 2023-06-24 MED ORDER — HYDRALAZINE HCL 25 MG PO TABS: 25.0000 mg | ORAL_TABLET | Freq: Three times a day (TID) | ORAL | Status: DC | PRN

## 2023-06-24 MED ORDER — LOSARTAN POTASSIUM 25 MG PO TABS
25.0000 mg | ORAL_TABLET | Freq: Every day | ORAL | Status: DC
  Administered 2023-06-24 – 2023-06-25 (×2): 25 mg via ORAL
  Filled 2023-06-24 (×2): qty 1

## 2023-06-24 NOTE — Progress Notes (Signed)
Physical Therapy Treatment Patient Details Name: Stacey [name] MRN: [id number] DOB: [birth year] Today's Date: 06/24/2023   History of Present Illness 82 yo female who presents on 06/21/23 after falling and sustaining R 9-10 rib fxs. PMH: ILD, RA, GERD, HTN    PT Comments  Pt in bed upon arrival and agreeable to PT session. Worked on gait training and LE strength in today's session. Pt was able to ambulate in the room with CGA for safety and standing rest breaks secondary to fatigue and SOB. Pt was 87% SpO2 on RA after ambulating, however, returned to 91% with PLB. Pt was able to perform pericare in standing, however, required assist to don/doff underwear. Pt is progressing well towards goals. Acute PT to follow.      If plan is discharge home, recommend the following: A little help with walking and/or transfers;A little help with bathing/dressing/bathroom;Assistance with cooking/housework;Assist for transportation;Help with stairs or ramp for entrance   Can travel by private vehicle     Yes  Equipment Recommendations  None recommended by PT       Precautions / Restrictions Precautions Precautions: Fall Precaution Comments: pt reports at least 3 falls in past 6 mos Restrictions Weight Bearing Restrictions Per Provider Order: No     Mobility  Bed Mobility Overal bed mobility: Needs Assistance Bed Mobility: Supine to Sit     Supine to sit: Supervision     General bed mobility comments: pt prefers to move into long sitting, and then move LE's off EOB    Transfers Overall transfer level: Needs assistance Equipment used: Rolling walker (2 wheels) Transfers: Sit to/from Stand Sit to Stand: Contact guard assist      General transfer comment: CGA for safety    Ambulation/Gait Ambulation/Gait assistance: Contact guard assist Gait Distance (Feet): 20 Feet (x2) Assistive device: Rolling walker (2 wheels) Gait Pattern/deviations: Step-through pattern Gait velocity:  decreased     General Gait Details: steady w/ no LOB, 87% on RA after ambulating. Returned to 91% w/ PLB       Balance Overall balance assessment: Needs assistance, History of Falls Sitting-balance support: Feet supported, No upper extremity supported Sitting balance-Leahy Scale: Fair     Standing balance support: Bilateral upper extremity supported, During functional activity Standing balance-Leahy Scale: Poor Standing balance comment: unsteady without UE support       Cognition Arousal: Alert Behavior During Therapy: WFL for tasks assessed/performed Overall Cognitive Status: No family/caregiver present to determine baseline cognitive functioning    General Comments: able to follow multi step commands        Exercises General Exercises - Lower Extremity Long Arc Quad: AROM, Both, 10 reps, Seated Hip Flexion/Marching: AROM, Both, 10 reps, Seated Other Exercises Other Exercises: 3 serial STS w/ RW and CGA Other Exercises: IS x10, pt only able to pull 500        Pertinent Vitals/Pain Pain Assessment Pain Assessment: No/denies pain     PT Goals (current goals can now be found in the care plan section) Acute Rehab PT Goals PT Goal Formulation: With patient Time For Goal Achievement: 07/06/23 Potential to Achieve Goals: Good Progress towards PT goals: Progressing toward goals    Frequency    Min 1X/week       AM-PAC PT "6 Clicks" Mobility   Outcome Measure  Help needed turning from your back to your side while in a flat bed without using bedrails?: A Little Help needed moving from lying on your back to sitting on the side  of a flat bed without using bedrails?: A Little Help needed moving to and from a bed to a chair (including a wheelchair)?: A Little Help needed standing up from a chair using your arms (e.g., wheelchair or bedside chair)?: A Little Help needed to walk in hospital room?: A Little Help needed climbing 3-5 steps with a railing? : A Lot 6  Click Score: 17    End of Session Equipment Utilized During Treatment: Gait belt Activity Tolerance: Patient tolerated treatment well Patient left: in chair;with call bell/phone within reach Nurse Communication: Mobility status PT Visit Diagnosis: Unsteadiness on feet (R26.81);Repeated falls (R29.6)     Time: 8295-6213 PT Time Calculation (min) (ACUTE ONLY): 31 min  Charges:    $Gait Training: 8-22 mins $Therapeutic Exercise: 8-22 mins PT General Charges $$ ACUTE PT VISIT: 1 Visit                     Hilton Cork, PT, DPT Secure Chat Preferred  Rehab Office [phone]   Arturo Morton Brion Aliment 06/24/2023, 2:40 PM

## 2023-06-24 NOTE — Plan of Care (Signed)
  Problem: Education: Goal: Knowledge of General Education information will improve Description: Including pain rating scale, medication(s)/side effects and non-pharmacologic comfort measures Outcome: Progressing   Problem: Activity: Goal: Risk for activity intolerance will decrease Outcome: Progressing   Problem: Pain Management: Goal: General experience of comfort will improve Outcome: Progressing   Problem: Safety: Goal: Ability to remain free from injury will improve Outcome: Progressing   Problem: Skin Integrity: Goal: Risk for impaired skin integrity will decrease Outcome: Progressing

## 2023-06-24 NOTE — Care Management Important Message (Signed)
Important Message  Patient Details  Name: Stacey [name] MRN: [id number] Date of Birth: 09/04/1940   Important Message Given:  Yes - Medicare IM     Sherilyn Banker 06/24/2023, 2:07 PM

## 2023-06-24 NOTE — TOC Progression Note (Signed)
Transition of Care [facility]) - Progression Note    Patient Details  Name: DEBORAHH CALLARD MRN: [id number] Date of Birth: Oct 15, 1940  Transition [facility]) CM/SW Contact  Carley Hammed, LCSW Phone Number: 06/24/2023, 1:21 PM  Clinical Narrative:    CSW met with pt at bedside and confirmed her son plans to transport her tomorrow to the facility. CSW notified [facility] of the plan to DC pt tomorrow to SNF. TOC continues to follow for DC needs.      Barriers to Discharge: Continued Medical Work up, Barriers Unresolved (comment) (3 midnight stay)  Expected Discharge Plan and Services     Post Acute Care Choice: Skilled Nursing Facility Living arrangements for the past 2 months: Single Family Home                                       Social Determinants of Health (SDOH) Interventions SDOH Screenings   Food Insecurity: No Food Insecurity (06/21/2023)  Housing: Low Risk  (06/24/2023)  Transportation Needs: No Transportation Needs (06/21/2023)  Utilities: Not At Risk (06/21/2023)  Depression (PHQ2-9): Low Risk  (10/24/2020)  Tobacco Use: Low Risk  (06/22/2023)    Readmission Risk Interventions     No data to display

## 2023-06-24 NOTE — Plan of Care (Signed)
  Problem: Education: Goal: Knowledge of General Education information will improve Description: Including pain rating scale, medication(s)/side effects and non-pharmacologic comfort measures Outcome: Progressing   Problem: Clinical Measurements: Goal: Respiratory complications will improve Outcome: Progressing Goal: Cardiovascular complication will be avoided Outcome: Progressing   Problem: Activity: Goal: Risk for activity intolerance will decrease Outcome: Progressing   Problem: Nutrition: Goal: Adequate nutrition will be maintained Outcome: Progressing   Problem: Coping: Goal: Level of anxiety will decrease Outcome: Progressing   

## 2023-06-24 NOTE — Progress Notes (Signed)
[facility]                                                                               Stacey [name], is a 82 y.o. female, DOB - Aug 08, 1940, ZDG:[id number] Admit date - 06/21/2023    Outpatient Primary MD for the patient is [name], MD  LOS - 2  days    Brief summary   82 yrs old female with PMH significant for Interstitial lung disease, rheumatoid arthritis, GERD presented in the ED with complaints of right-sided rib pain for last few days.  Patient reports she had a fall in the bathroom while trying to stand up from a toilet and hit the right side of her chest, she does not recall hitting her head, denies any loss of consciousness.  She has been walking since her fall.  She was found to be hypoxic on arrival with SpO2 of 86% on room air requiring 2 L of supplemental oxygen.  She is admitted for further evaluation.     Assessment & Plan    Assessment and Plan:  Closed fracture of the 9th and 10 th ribs ont he right side.  S/p mechanical fall.  Pain control, continue with IS.  Therapy evaluations.     Acute respiratory failure with hypoxia  Resolved.    Hypertension Not well controlled. Restarted home meds and added hydralazine .   RA Stable.    AKI Resolved.    Debility Therapy eval recommending SNF.        RN Pressure Injury Documentation:    Malnutrition Type:  Nutrition Problem: Severe Malnutrition Etiology: acute illness   Malnutrition Characteristics:  Signs/Symptoms: severe muscle depletion, severe fat depletion   Nutrition Interventions:  Interventions: Ensure Enlive (each supplement provides 350kcal and 20 grams of protein), Prostat  Estimated body mass index is 21.28 kg/m as calculated from the following:   Height as of this encounter: 5\' 6"  (1.676 m).   Weight as of this encounter: 59.8 kg.  Code Status: full code.  DVT Prophylaxis:  SCDs Start: 06/21/23 2138   Level of Care: Level of care: Telemetry  Medical Family Communication: family at bedside.   Disposition Plan:     Remains inpatient appropriate:  pain control.   Procedures:  None.   Consultants:   None.  Antimicrobials:   Anti-infectives (From admission, onward)    Start     Dose/Rate Route Frequency Ordered Stop   06/23/23 0900  sulfamethoxazole-trimethoprim (BACTRIM DS) 800-160 MG per tablet 1 tablet        1 tablet Oral Once per day on Monday Wednesday Friday 06/22/23 0948     06/21/23 2200  sulfamethoxazole-trimethoprim (BACTRIM DS) 800-160 MG per tablet 1 tablet  Status:  Discontinued        1 tablet Oral 2 times daily 06/21/23 2137 06/22/23 0948        Medications  Scheduled Meds:  (feeding supplement) PROSource Plus  30 mL Oral BID BM   amitriptyline  25 mg Oral QHS   docusate sodium  100 mg Oral BID   feeding supplement  237 mL Oral BID BM   gabapentin  600 mg Oral QHS  guaiFENesin  600 mg Oral BID   methylPREDNISolone  2 mg Oral QODAY   methylPREDNISolone  4 mg Oral QODAY   milk and molasses  1 enema Rectal Once   multivitamin with minerals  1 tablet Oral Daily   pravastatin  20 mg Oral Daily   senna  1 tablet Oral BID   sulfamethoxazole-trimethoprim  1 tablet Oral Once per day on Monday Wednesday Friday   venlafaxine XR  75 mg Oral QHS   Continuous Infusions: PRN Meds:.bisacodyl, fentaNYL (SUBLIMAZE) injection, HYDROcodone-acetaminophen, ipratropium-albuterol, ketorolac, methocarbamol (ROBAXIN) injection, ondansetron **OR** ondansetron (ZOFRAN) IV, polyethylene glycol, traMADol    Subjective:   Stacey [name] was seen and examined today.  Feels much better.   Objective:   Vitals:   06/23/23 1621 06/23/23 2021 06/24/23 0430 06/24/23 0837  BP: (!) 156/64 (!) 150/56 (!) 145/58 (!) 174/81  Pulse: 91 87 71 79  Resp: 17 18 18 18   Temp: 98.3 F (36.8 C) 99.1 F (37.3 C) 97.6 F (36.4 C) 98.1 F (36.7 C)  TempSrc: Oral Oral Oral Oral  SpO2: 96% 94% 92% 96%  Weight:      Height:         Intake/Output Summary (Last 24 hours) at 06/24/2023 1555 Last data filed at 06/24/2023 1300 Gross per 24 hour  Intake 620 ml  Output 900 ml  Net -280 ml   Filed Weights   06/21/23 1007 06/21/23 2255  Weight: 59 kg 59.8 kg     Exam General exam: Appears calm and comfortable  Respiratory system: Clear to auscultation. Respiratory effort normal. Cardiovascular system: S1 & S2 heard, RRR.  Gastrointestinal system: Abdomen is nondistended, soft and nontender.  Central nervous system: Alert and oriented. No focal neurological deficits. Extremities: Symmetric 5 x 5 power. Skin: No rashes,  Psychiatry: Mood & affect appropriate.     Data Reviewed:  I have personally reviewed following labs and imaging studies   CBC Lab Results  Component Value Date   WBC 9.1 06/22/2023   RBC 3.96 06/22/2023   HGB 11.7 (L) 06/22/2023   HCT 37.2 06/22/2023   MCV 93.9 06/22/2023   MCH 29.5 06/22/2023   PLT 247 06/22/2023   MCHC 31.5 06/22/2023   RDW 13.8 06/22/2023   LYMPHSABS 2.1 06/21/2023   MONOABS 1.3 (H) 06/21/2023   EOSABS 0.3 06/21/2023   BASOSABS 0.1 06/21/2023     Last metabolic panel Lab Results  Component Value Date   NA 135 06/22/2023   K 4.2 06/22/2023   CL 100 06/22/2023   CO2 25 06/22/2023   BUN 12 06/22/2023   CREATININE 0.84 06/22/2023   GLUCOSE 96 06/22/2023   GFRNONAA >60 06/22/2023   GFRAA >60 02/15/2020   CALCIUM 8.3 (L) 06/22/2023   PHOS 3.5 06/22/2023   PROT 5.6 (L) 06/22/2023   ALBUMIN 3.0 (L) 06/22/2023   BILITOT 0.7 06/22/2023   ALKPHOS 49 06/22/2023   AST 30 06/22/2023   ALT 16 06/22/2023   ANIONGAP 10 06/22/2023    CBG (last 3)  No results for input(s): "GLUCAP" in the last 72 hours.    Coagulation Profile: No results for input(s): "INR", "PROTIME" in the last 168 hours.   Radiology Studies: No results found.     Kathlen Mody M.D. [facility] 06/24/2023, 3:55 PM  Available via Epic secure chat 7am-7pm After 7 pm,  please refer to night coverage provider listed on amion.

## 2023-06-24 NOTE — Plan of Care (Signed)

## 2023-06-25 DIAGNOSIS — R2681 Unsteadiness on feet: Secondary | ICD-10-CM | POA: Diagnosis not present

## 2023-06-25 DIAGNOSIS — E785 Hyperlipidemia, unspecified: Secondary | ICD-10-CM | POA: Diagnosis not present

## 2023-06-25 DIAGNOSIS — E43 Unspecified severe protein-calorie malnutrition: Secondary | ICD-10-CM | POA: Diagnosis not present

## 2023-06-25 DIAGNOSIS — S2241XD Multiple fractures of ribs, right side, subsequent encounter for fracture with routine healing: Secondary | ICD-10-CM | POA: Diagnosis not present

## 2023-06-25 DIAGNOSIS — J9621 Acute and chronic respiratory failure with hypoxia: Secondary | ICD-10-CM | POA: Diagnosis not present

## 2023-06-25 DIAGNOSIS — M6281 Muscle weakness (generalized): Secondary | ICD-10-CM | POA: Diagnosis not present

## 2023-06-25 DIAGNOSIS — R296 Repeated falls: Secondary | ICD-10-CM | POA: Diagnosis not present

## 2023-06-25 DIAGNOSIS — I10 Essential (primary) hypertension: Secondary | ICD-10-CM | POA: Diagnosis not present

## 2023-06-25 DIAGNOSIS — F324 Major depressive disorder, single episode, in partial remission: Secondary | ICD-10-CM | POA: Diagnosis not present

## 2023-06-25 DIAGNOSIS — R278 Other lack of coordination: Secondary | ICD-10-CM | POA: Diagnosis not present

## 2023-06-25 DIAGNOSIS — M069 Rheumatoid arthritis, unspecified: Secondary | ICD-10-CM | POA: Diagnosis not present

## 2023-06-25 DIAGNOSIS — J984 Other disorders of lung: Secondary | ICD-10-CM | POA: Diagnosis not present

## 2023-06-25 DIAGNOSIS — K219 Gastro-esophageal reflux disease without esophagitis: Secondary | ICD-10-CM | POA: Diagnosis not present

## 2023-06-25 DIAGNOSIS — N179 Acute kidney failure, unspecified: Secondary | ICD-10-CM | POA: Diagnosis not present

## 2023-06-25 DIAGNOSIS — L22 Diaper dermatitis: Secondary | ICD-10-CM | POA: Diagnosis not present

## 2023-06-25 DIAGNOSIS — F329 Major depressive disorder, single episode, unspecified: Secondary | ICD-10-CM | POA: Diagnosis not present

## 2023-06-25 DIAGNOSIS — S2231XD Fracture of one rib, right side, subsequent encounter for fracture with routine healing: Secondary | ICD-10-CM | POA: Diagnosis not present

## 2023-06-25 DIAGNOSIS — M059 Rheumatoid arthritis with rheumatoid factor, unspecified: Secondary | ICD-10-CM | POA: Diagnosis not present

## 2023-06-25 MED ORDER — TRAMADOL HCL 50 MG PO TABS: 50.0000 mg | ORAL_TABLET | Freq: Two times a day (BID) | ORAL | 0 refills | Status: AC | PRN

## 2023-06-25 MED ORDER — ENSURE ENLIVE PO LIQD: 237.0000 mL | Freq: Two times a day (BID) | ORAL | 12 refills | Status: DC

## 2023-06-25 MED ORDER — POLYETHYLENE GLYCOL 3350 17 G PO PACK: 17.0000 g | PACK | Freq: Every day | ORAL | 0 refills | Status: AC | PRN

## 2023-06-25 MED ORDER — PROSOURCE PLUS PO LIQD: 30.0000 mL | Freq: Two times a day (BID) | ORAL | 5 refills | Status: DC

## 2023-06-25 NOTE — Plan of Care (Signed)

## 2023-06-25 NOTE — Progress Notes (Signed)
Physical Therapy Treatment Patient Details Name: Stacey [name] MRN: [id number] DOB: [birth year] Today's Date: 06/25/2023   History of Present Illness 82 yo female who presents on 06/21/23 after falling and sustaining R 9-10 rib fxs. PMH: ILD, RA, GERD, HTN    PT Comments  Pt in recliner upon arrival and agreeable to PT session. Worked on gait training, LE strength, and balance in standing in today's session. Pt was able to ambulate ~60 ft, however, pt has difficulty navigating with RW and requires cues to avoid obstacles. Pt tolerated LE exercises well with seated rest breaks in between activities. Pt is progressing well towards goals. Current d/c recs remain appropriate as pt needs close guard for mobility and would benefit from continued rehab to be safe upon d/c home. Acute PT to follow.      If plan is discharge home, recommend the following: A little help with walking and/or transfers;A little help with bathing/dressing/bathroom;Assistance with cooking/housework;Assist for transportation;Help with stairs or ramp for entrance   Can travel by private vehicle     Yes  Equipment Recommendations  None recommended by PT       Precautions / Restrictions Precautions Precautions: Fall Precaution Comments: pt reports at least 3 falls in past 6 mos Restrictions Weight Bearing Restrictions Per Provider Order: No     Mobility  Bed Mobility  General bed mobility comments: in recliner upon arrival    Transfers Overall transfer level: Needs assistance Equipment used: Rolling walker (2 wheels) Transfers: Sit to/from Stand Sit to Stand: Contact guard assist     General transfer comment: CGA for safety    Ambulation/Gait Ambulation/Gait assistance: Contact guard assist Gait Distance (Feet): 60 Feet Assistive device: Rolling walker (2 wheels) Gait Pattern/deviations: Step-through pattern Gait velocity: decreased     General Gait Details: difficulty navigating w/ RW, runs into  obstacles         Balance Overall balance assessment: Needs assistance, History of Falls Sitting-balance support: Feet supported, No upper extremity supported Sitting balance-Leahy Scale: Fair     Standing balance support: Bilateral upper extremity supported, During functional activity Standing balance-Leahy Scale: Poor Standing balance comment: unsteady without UE support       Cognition Arousal: Alert Behavior During Therapy: WFL for tasks assessed/performed Overall Cognitive Status: No family/caregiver present to determine baseline cognitive functioning      Exercises General Exercises - Lower Extremity Long Arc Quad: AROM, Both, 10 reps, Seated Hip Flexion/Marching: AROM, Both, 10 reps, Standing Other Exercises Other Exercises: 5 serial STS w/ RW and CGA Other Exercises: standing hip ABD, B x10        Pertinent Vitals/Pain Pain Assessment Pain Assessment: No/denies pain     PT Goals (current goals can now be found in the care plan section) Acute Rehab PT Goals PT Goal Formulation: With patient Time For Goal Achievement: 07/06/23 Potential to Achieve Goals: Good Progress towards PT goals: Progressing toward goals    Frequency    Min 1X/week       AM-PAC PT "6 Clicks" Mobility   Outcome Measure  Help needed turning from your back to your side while in a flat bed without using bedrails?: A Little Help needed moving from lying on your back to sitting on the side of a flat bed without using bedrails?: A Little Help needed moving to and from a bed to a chair (including a wheelchair)?: A Little Help needed standing up from a chair using your arms (e.g., wheelchair or bedside chair)?: A Little Help  needed to walk in hospital room?: A Little Help needed climbing 3-5 steps with a railing? : A Lot 6 Click Score: 17    End of Session Equipment Utilized During Treatment: Gait belt Activity Tolerance: Patient tolerated treatment well Patient left: in  chair;with call bell/phone within reach Nurse Communication: Mobility status PT Visit Diagnosis: Unsteadiness on feet (R26.81);Repeated falls (R29.6)     Time: 1126-1140 PT Time Calculation (min) (ACUTE ONLY): 14 min  Charges:    $Gait Training: 8-22 mins PT General Charges $$ ACUTE PT VISIT: 1 Visit                    Hilton Cork, PT, DPT Secure Chat Preferred  Rehab Office [phone]   Arturo Morton Brion Aliment 06/25/2023, 11:43 AM

## 2023-06-25 NOTE — Progress Notes (Signed)
Attempted to call and give report to [facility] senior living facility. Was unable to get in touch with the receiving nurse, left a message and a call back number for Stacey [name].

## 2023-06-25 NOTE — Discharge Summary (Signed)
Physician Discharge Summary   Patient: Stacey [name] MRN: [id number] DOB: [birth year]  Admit date:     06/21/2023  Discharge date: 06/25/23  Discharge Physician: Kathlen Mody   PCP: [name], MD   Recommendations at discharge:  Please follow up with pulmonology in one week.  Please follow up with PCP in 1 to 2 weeks.   Discharge Diagnoses: Principal Problem:   Rib fracture Active Problems:   Rheumatoid arthritis (HCC)   Chronic lung disease   Essential hypertension   Acute on chronic respiratory failure with hypoxia (HCC)   Fall at home, initial encounter   Constipation   AKI (acute kidney injury) (HCC)   Debility   Multiple rib fractures   Protein-calorie malnutrition, severe  Resolved Problems:   * No resolved hospital problems. *  Hospital Course:  82 yrs old female with PMH significant for Interstitial lung disease, rheumatoid arthritis, GERD presented in the ED with complaints of right-sided rib pain for last few days. Patient reports she had a fall in the bathroom while trying to stand up from a toilet and hit the right side of her chest, she does not recall hitting her head, denies any loss of consciousness. She has been walking since her fall. She was found to be hypoxic on arrival with SpO2 of 86% on room air requiring 2 L of supplemental oxygen. She is admitted for further evaluation.    Assessment and Plan:   Closed fracture of the 9th and 10 th ribs ont he right side.  S/p mechanical fall.  Pain control, continue with IS.  Therapy evaluations.        Acute respiratory failure with hypoxia  Resolved. Repeat CXR is negative for any pneumonia.      Hypertension Better controlled. Restarted home meds.    RA Stable.      AKI Resolved.      Debility Therapy eval recommending SNF.               Consultants: none.  Procedures performed: none.   Disposition: Skilled nursing facility Diet recommendation:  Regular diet DISCHARGE  MEDICATION: Allergies as of 06/25/2023       Reactions   Penicillins Other (See Comments)   Unknown reaction   Remicade [infliximab] Other (See Comments)   Unknown reaction        Medication List     TAKE these medications    (feeding supplement) PROSource Plus liquid Take 30 mLs by mouth 2 (two) times daily between meals.   feeding supplement Liqd Take 237 mLs by mouth 2 (two) times daily between meals.   acetaminophen 500 MG tablet Commonly known as: TYLENOL Take 1,000 mg by mouth every 6 (six) hours as needed for moderate pain (pain score 4-6).   amitriptyline 25 MG tablet Commonly known as: ELAVIL Take 25 mg by mouth at bedtime.   BIOTIN PO Take 1 tablet by mouth daily.   Eye Vitamins Caps Take 1 capsule by mouth daily.   gabapentin 300 MG capsule Commonly known as: NEURONTIN Take 600 mg by mouth at bedtime.   losartan 25 MG tablet Commonly known as: COZAAR Take 25 mg by mouth daily.   methylPREDNISolone 4 MG tablet Commonly known as: MEDROL Take 1 tablet (4 mg total) by mouth every other day. Alternate with the 2mg  dose What changed:  how much to take when to take this additional instructions   Ofev 100 MG Caps Generic drug: Nintedanib Take 1 capsule (100 mg total) by mouth  2 (two) times daily.   ondansetron 4 MG tablet Commonly known as: ZOFRAN TAKE ONE TABLET BY MOUTH EVERY 8 HOURS AS NEEDED FOR NAUSEA AND VOMITING What changed: See the new instructions.   polyethylene glycol 17 g packet Commonly known as: MIRALAX / GLYCOLAX Take 17 g by mouth daily as needed for mild constipation.   pravastatin 20 MG tablet Commonly known as: PRAVACHOL Take 1 tablet (20 mg total) by mouth daily.   RITUXAN IV Inject 1,000 mg into the vein every 6 (six) months.   sulfamethoxazole-trimethoprim 800-160 MG tablet Commonly known as: BACTRIM DS Take 1 tablet by mouth 3 (three) times a week.   traMADol 50 MG tablet Commonly known as: ULTRAM Take 1 tablet  (50 mg total) by mouth every 12 (twelve) hours as needed for up to 5 days for moderate pain (pain score 4-6).   venlafaxine XR 75 MG 24 hr capsule Commonly known as: EFFEXOR-XR Take 75 mg by mouth at bedtime.   VITAMIN D-3 PO Take 1 capsule by mouth daily.        Follow-up Information     [name], MD In 2 days.   Specialty: Internal Medicine Contact information: [address] [place] [address] [phone]         [facility] Emergency Department at [facility] .   Specialty: Emergency Medicine Why: If symptoms worsen Contact information: [address] [place] [address] [phone]               Discharge Exam: Ceasar Mons Weights   06/21/23 1007 06/21/23 2255  Weight: 59 kg 59.8 kg   General exam: Appears calm and comfortable  Respiratory system: Clear to auscultation. Respiratory effort normal. Cardiovascular system: S1 & S2 heard, RRR.  Gastrointestinal system: Abdomen is nondistended, soft and nontender. Central nervous system: Alert and oriented. No focal neurological deficits. Extremities: Symmetric 5 x 5 power. Skin: No rashes, Psychiatry:  Mood & affect appropriate.    Condition at discharge: fair  The results of significant diagnostics from this hospitalization (including imaging, microbiology, ancillary and laboratory) are listed below for reference.   Imaging Studies: DG CHEST PORT 1 VIEW Result Date: 06/24/2023 CLINICAL DATA:  Cough, rib injury EXAM: PORTABLE CHEST 1 VIEW COMPARISON:  CT chest dated 06/21/2023 FINDINGS: Right apical pleural-parenchymal scarring. Subpleural reticulation fibrosis in the bilateral lower lobes, left greater than right. This is better evaluated on recent CT, suggesting chronic interstitial lung disease. No pleural effusion or pneumothorax. The heart is normal in size.  Thoracic aortic atherosclerosis. Nondisplaced right lateral 9th and 10th rib fractures. IMPRESSION: Nondisplaced  right lateral 9th and 10th rib fractures. No pneumothorax. Chronic interstitial lung disease, better evaluated on recent CT. Electronically Signed   By: Charline Bills M.D.   On: 06/24/2023 18:39   VAS [place] LOWER EXTREMITY VENOUS (DVT) Result Date: 06/22/2023  Lower Venous DVT Study Patient Name:  Stacey [name]  Date of Exam:   06/22/2023 Medical Rec #: [id number]          Accession #:    [id number] Date of Birth: Oct 06, 1940          Patient Gender: F Patient Age:   78 years Exam Location:  [facility] Procedure:      VAS [place] LOWER EXTREMITY VENOUS (DVT) Referring Phys: Jonny Ruiz DOUTOVA --------------------------------------------------------------------------------  Indications: Edema.  Risk Factors: Recent fall. Comparison Study: Previous exam on 08/09/2017 was negative for DVT Performing Technologist: Ernestene Mention RVT, RDMS  Examination Guidelines:  A complete evaluation includes B-mode imaging, spectral Doppler, color Doppler, and power Doppler as needed of all accessible portions of each vessel. Bilateral testing is considered an integral part of a complete examination. Limited examinations for reoccurring indications may be performed as noted. The reflux portion of the exam is performed with the patient in reverse Trendelenburg.  +---------+---------------+---------+-----------+----------+--------------+ RIGHT    CompressibilityPhasicitySpontaneityPropertiesThrombus Aging +---------+---------------+---------+-----------+----------+--------------+ CFV      Full           Yes      Yes                                 +---------+---------------+---------+-----------+----------+--------------+ SFJ      Full                                                        +---------+---------------+---------+-----------+----------+--------------+ FV Prox  Full           Yes      Yes                                  +---------+---------------+---------+-----------+----------+--------------+ FV Mid   Full           Yes      Yes                                 +---------+---------------+---------+-----------+----------+--------------+ FV DistalFull           Yes      Yes                                 +---------+---------------+---------+-----------+----------+--------------+ PFV      Full                                                        +---------+---------------+---------+-----------+----------+--------------+ POP      Full           Yes      Yes                                 +---------+---------------+---------+-----------+----------+--------------+ PTV      Full                                                        +---------+---------------+---------+-----------+----------+--------------+ PERO     Full                                                        +---------+---------------+---------+-----------+----------+--------------+   +----+---------------+---------+-----------+----------+--------------+ LEFTCompressibilityPhasicitySpontaneityPropertiesThrombus Aging +----+---------------+---------+-----------+----------+--------------+ CFV  Full           Yes      Yes                                 +----+---------------+---------+-----------+----------+--------------+     Summary: RIGHT: - No evidence of deep vein thrombosis in the lower extremity. No indirect evidence of obstruction proximal to the inguinal ligament.  - No cystic structure found in the popliteal fossa.  LEFT: - No evidence of common femoral vein obstruction.   *See table(s) above for measurements and observations. Electronically signed by Carolynn Sayers on 06/22/2023 at 1:13:41 PM.    Final    CT Head Wo Contrast Result Date: 06/21/2023 CLINICAL DATA:  Head trauma, minor (Age >= 65y).  Recent fall. EXAM: CT HEAD WITHOUT CONTRAST TECHNIQUE: Contiguous axial images were obtained from the base  of the skull through the vertex without intravenous contrast. RADIATION DOSE REDUCTION: This exam was performed according to the departmental dose-optimization program which includes automated exposure control, adjustment of the mA and/or kV according to patient size and/or use of iterative reconstruction technique. COMPARISON:  Head CT 01/01/2022 FINDINGS: Brain: There is no evidence of an acute infarct, intracranial hemorrhage, mass, midline shift, or extra-axial fluid collection. A lacunar infarct in the left caudate head is new but chronic in appearance. Central predominant cerebral atrophy is unchanged. Vascular: No hyperdense vessel. Skull: No acute fracture or suspicious osseous lesion. Sinuses/Orbits: Minimal mucosal thickening in the right frontal and ethmoid sinuses. Clear mastoid air cells. Bilateral cataract extraction. Other: None. IMPRESSION: 1. No evidence of acute intracranial abnormality. 2. Interval chronic left caudate lacunar infarct. 3. Cerebral atrophy. Electronically Signed   By: Alcide Clever M.D.   On: 06/21/2023 20:17   CT CHEST ABDOMEN PELVIS W CONTRAST Result Date: 06/21/2023 CLINICAL DATA:  Right rib pain after fall several days ago. EXAM: CT CHEST, ABDOMEN, AND PELVIS WITH CONTRAST TECHNIQUE: Multidetector CT imaging of the chest, abdomen and pelvis was performed following the standard protocol during bolus administration of intravenous contrast. RADIATION DOSE REDUCTION: This exam was performed according to the departmental dose-optimization program which includes automated exposure control, adjustment of the mA and/or kV according to patient size and/or use of iterative reconstruction technique. CONTRAST:  60mL OMNIPAQUE IOHEXOL 350 MG/ML SOLN COMPARISON:  Nov 17, 2022.  March 04, 2020. FINDINGS: CT CHEST FINDINGS Cardiovascular: Atherosclerosis of thoracic aorta is noted without aneurysm or dissection. Mild coronary calcifications are noted. Normal cardiac size. No pericardial  effusion. Mediastinum/Nodes: Small sliding-type hiatal hernia. No adenopathy. Thyroid gland is unremarkable. Lungs/Pleura: No pneumothorax or pleural effusion is noted. Stable bilateral interstitial opacities are noted as well as biapical scarring consistent with interstitial lung disease. No acute pulmonary abnormality is noted. Musculoskeletal: Minimally displaced fractures are seen involving the right ninth and tenth ribs. CT ABDOMEN PELVIS FINDINGS Hepatobiliary: No focal liver abnormality is seen. Status post cholecystectomy. No biliary dilatation. Pancreas: Unremarkable. No pancreatic ductal dilatation or surrounding inflammatory changes. Spleen: Normal in size without focal abnormality. Adrenals/Urinary Tract: Adrenal glands are unremarkable. Kidneys are normal, without renal calculi, focal lesion, or hydronephrosis. Bladder is unremarkable. Stomach/Bowel: The stomach is unremarkable. There is no evidence of bowel obstruction or inflammation. Large amount of stool is noted in the right and transverse colon. Appendix is not clearly visualized. Vascular/Lymphatic: Aortic atherosclerosis. No enlarged abdominal or pelvic lymph nodes. Reproductive: Uterus and bilateral adnexa are unremarkable. Other: No abdominal wall hernia or  abnormality. No abdominopelvic ascites. Musculoskeletal: No acute or significant osseous findings. IMPRESSION: Minimally displaced right ninth and tenth rib fractures. Stable bilateral pulmonary findings are noted consistent with biapical scarring and chronic interstitial lung disease. Small sliding-type hiatal hernia. Large amount of stool seen in the colon. Mild coronary artery calcifications are noted. Aortic Atherosclerosis (ICD10-I70.0). Electronically Signed   By: Lupita Raider M.D.   On: 06/21/2023 12:46   DG Ribs Unilateral W/Chest Right Result Date: 06/21/2023 CLINICAL DATA:  Fall, rib pain. Large volume stool in the RIGHT colon. EXAM: RIGHT RIBS AND CHEST - 3+ VIEW  COMPARISON:  None Available. FINDINGS: Nondisplaced fracture of the RIGHT lateral ninth rib. No pneumothorax or pleural fluid identified. Chronic bronchitic markings in the lungs. Atherosclerotic calcification of the aorta. IMPRESSION: 1. Nondisplaced fracture of the RIGHT lateral ninth rib. 2. No pneumothorax. Electronically Signed   By: Genevive Bi M.D.   On: 06/21/2023 12:10    Microbiology: Results for orders placed or performed during the hospital encounter of 11/17/22  Culture, blood (Routine X 2) w Reflex to ID Panel     Status: None   Collection Time: 11/18/22 10:40 AM   Specimen: BLOOD  Result Value Ref Range Status   Specimen Description   Final    BLOOD BLOOD RIGHT WRIST Performed at [facility], [address] [place], [place] [address]    Special Requests   Final    BOTTLES DRAWN AEROBIC AND ANAEROBIC Blood Culture results may not be optimal due to an excessive volume of blood received in culture bottles Performed at [facility], [address] [place], [place] [address]    Culture   Final    NO GROWTH 5 DAYS Performed at [facility], [address] [place], [place] [address]    Report Status 11/23/2022 FINAL  Final  Culture, blood (Routine X 2) w Reflex to ID Panel     Status: None   Collection Time: 11/18/22 10:49 AM   Specimen: BLOOD  Result Value Ref Range Status   Specimen Description   Final    BLOOD BLOOD RIGHT FOREARM Performed at [facility] [address], [place], [place] [address]    Special Requests   Final    BOTTLES DRAWN AEROBIC AND ANAEROBIC Blood Culture results may not be optimal due to an excessive volume of blood received in culture bottles Performed at [facility], [address] [place], [place] [address]    Culture   Final    NO GROWTH 5 DAYS Performed at [facility], [address], [place], [place] [address]    Report Status 11/23/2022  FINAL  Final    Labs: CBC: Recent Labs  Lab 06/21/23 1039 06/21/23 1047 06/22/23 0612  WBC  --  9.9 9.1  NEUTROABS  --  6.2  --   HGB 12.9 12.3 11.7*  HCT 38.0 39.0 37.2  MCV  --  94.9 93.9  PLT  --  266 247   Basic Metabolic Panel: Recent Labs  Lab 06/21/23 1039 06/21/23 1047 06/22/23 0612  NA 137 138 135  K 4.2 4.2 4.2  CL 100 103 100  CO2  --  27 25  GLUCOSE 101* 103* 96  BUN 14 12 12   CREATININE 1.10* 1.32* 0.84  CALCIUM  --  8.9 8.3*  MG  --  1.9 1.8  PHOS  --  4.1 3.5   Liver Function Tests: Recent Labs  Lab 06/21/23 1047 06/22/23 0612  AST 24 30  ALT 14 16  ALKPHOS 53 49  BILITOT 0.6 0.7  PROT 6.2* 5.6*  ALBUMIN 3.3* 3.0*   CBG: No results for input(s): "GLUCAP" in the last 168 hours.  Discharge time spent: 38 minutes.   Signed: Kathlen Mody, MD [facility] 06/25/2023

## 2023-06-25 NOTE — TOC Transition Note (Signed)
Transition of Care [facility]) - Discharge Note   Patient Details  Name: Stacey [name] MRN: [id number] Date of Birth: [birth year]  [facility] of [facility]) CM/SW Contact:  Carley Hammed, LCSW Phone Number: 06/25/2023, 10:18 AM   Clinical Narrative:    Pt to be transported to [facility] via son. Nurse to call report to [phone]. Rm# [phone] ready at 3PM.   Final next level of care: Skilled Nursing Facility Barriers to Discharge: Barriers Resolved   Patient Goals and CMS Choice Patient states their goals for this hospitalization and ongoing recovery are:: Pt states she wants to regain independence to move into ILF. CMS Medicare.gov Compare Post Acute Care list provided to:: Patient Choice offered to / list presented to : Patient      Discharge Placement              Patient chooses bed at: [facility] Patient to be transferred to facility by: Son Name of family member notified: Patient to notify Patient and family notified of of transfer: 06/25/23  Discharge Plan and Services Additional resources added to the After Visit Summary for       Post Acute Care Choice: Skilled Nursing Facility                               Social Drivers of Health (SDOH) Interventions SDOH Screenings   Food Insecurity: No Food Insecurity (06/21/2023)  Housing: Low Risk  (06/24/2023)  Transportation Needs: No Transportation Needs (06/21/2023)  Utilities: Not At Risk (06/21/2023)  Depression (PHQ2-9): Low Risk  (10/24/2020)  Tobacco Use: Low Risk  (06/22/2023)     Readmission Risk Interventions     No data to display

## 2023-06-25 NOTE — Progress Notes (Signed)
   06/25/23 0936  Mobility  Activity Transferred to/from [facility];Transferred from bed to chair  Level of Assistance Contact guard assist, steadying assist  Assistive Device Front wheel walker  Activity Response Tolerated well  Mobility Referral Yes  Mobility visit 1 Mobility  Mobility Specialist Start Time (ACUTE ONLY) [id number]  Mobility Specialist Stop Time (ACUTE ONLY) 0935  Mobility Specialist Time Calculation (min) (ACUTE ONLY) 30 min   Mobility Specialist: Progress Note  During Mobility: HR 86  Pt agreeable to mobility session - received in bed. Required CG using RW. Pt was asymptomatic throughout session with no complaints. Pt with light red/ yellow secretions after using BSC. Returned to bed with all needs met - call bell within reach.   Barnie Mort, BS Mobility Specialist Please contact via SecureChat or Rehab office at [phone].

## 2023-06-28 DIAGNOSIS — F324 Major depressive disorder, single episode, in partial remission: Secondary | ICD-10-CM | POA: Diagnosis not present

## 2023-06-28 DIAGNOSIS — K219 Gastro-esophageal reflux disease without esophagitis: Secondary | ICD-10-CM | POA: Diagnosis not present

## 2023-06-28 DIAGNOSIS — L22 Diaper dermatitis: Secondary | ICD-10-CM | POA: Diagnosis not present

## 2023-06-28 DIAGNOSIS — J9621 Acute and chronic respiratory failure with hypoxia: Secondary | ICD-10-CM | POA: Diagnosis not present

## 2023-06-28 DIAGNOSIS — E43 Unspecified severe protein-calorie malnutrition: Secondary | ICD-10-CM | POA: Diagnosis not present

## 2023-06-28 DIAGNOSIS — S2241XD Multiple fractures of ribs, right side, subsequent encounter for fracture with routine healing: Secondary | ICD-10-CM | POA: Diagnosis not present

## 2023-06-28 DIAGNOSIS — M059 Rheumatoid arthritis with rheumatoid factor, unspecified: Secondary | ICD-10-CM | POA: Diagnosis not present

## 2023-06-28 DIAGNOSIS — E785 Hyperlipidemia, unspecified: Secondary | ICD-10-CM | POA: Diagnosis not present

## 2023-06-28 DIAGNOSIS — I10 Essential (primary) hypertension: Secondary | ICD-10-CM | POA: Diagnosis not present

## 2023-06-28 DIAGNOSIS — N179 Acute kidney failure, unspecified: Secondary | ICD-10-CM | POA: Diagnosis not present

## 2023-07-20 DIAGNOSIS — E785 Hyperlipidemia, unspecified: Secondary | ICD-10-CM | POA: Diagnosis not present

## 2023-07-20 DIAGNOSIS — S2241XA Multiple fractures of ribs, right side, initial encounter for closed fracture: Secondary | ICD-10-CM | POA: Diagnosis not present

## 2023-07-20 DIAGNOSIS — J449 Chronic obstructive pulmonary disease, unspecified: Secondary | ICD-10-CM | POA: Diagnosis not present

## 2023-07-20 DIAGNOSIS — I10 Essential (primary) hypertension: Secondary | ICD-10-CM | POA: Diagnosis not present

## 2023-07-20 DIAGNOSIS — M810 Age-related osteoporosis without current pathological fracture: Secondary | ICD-10-CM | POA: Diagnosis not present

## 2023-07-20 DIAGNOSIS — K219 Gastro-esophageal reflux disease without esophagitis: Secondary | ICD-10-CM | POA: Diagnosis not present

## 2023-07-20 DIAGNOSIS — J849 Interstitial pulmonary disease, unspecified: Secondary | ICD-10-CM | POA: Diagnosis not present

## 2023-07-20 DIAGNOSIS — R296 Repeated falls: Secondary | ICD-10-CM | POA: Diagnosis not present

## 2023-07-20 DIAGNOSIS — J9611 Chronic respiratory failure with hypoxia: Secondary | ICD-10-CM | POA: Diagnosis not present

## 2023-07-20 DIAGNOSIS — R627 Adult failure to thrive: Secondary | ICD-10-CM | POA: Diagnosis not present

## 2023-07-20 DIAGNOSIS — M0510 Rheumatoid lung disease with rheumatoid arthritis of unspecified site: Secondary | ICD-10-CM | POA: Diagnosis not present

## 2023-07-23 DIAGNOSIS — M05711 Rheumatoid arthritis with rheumatoid factor of right shoulder without organ or systems involvement: Secondary | ICD-10-CM | POA: Diagnosis not present

## 2023-08-14 DIAGNOSIS — W19XXXA Unspecified fall, initial encounter: Secondary | ICD-10-CM | POA: Diagnosis not present

## 2023-08-14 DIAGNOSIS — R531 Weakness: Secondary | ICD-10-CM | POA: Diagnosis not present

## 2023-08-14 DIAGNOSIS — R1111 Vomiting without nausea: Secondary | ICD-10-CM | POA: Diagnosis not present

## 2023-08-14 DIAGNOSIS — I10 Essential (primary) hypertension: Secondary | ICD-10-CM | POA: Diagnosis not present

## 2023-08-14 DIAGNOSIS — R110 Nausea: Secondary | ICD-10-CM | POA: Diagnosis not present

## 2023-08-15 ENCOUNTER — Encounter (HOSPITAL_COMMUNITY): Payer: Self-pay | Admitting: Emergency Medicine

## 2023-08-15 ENCOUNTER — Inpatient Hospital Stay (HOSPITAL_COMMUNITY)
Admission: EM | Admit: 2023-08-15 | Discharge: 2023-08-17 | DRG: 193 | Disposition: A | Discharge status: Skilled Nursing Facility | Payer: Medicare Other | Attending: Internal Medicine | Admitting: Internal Medicine

## 2023-08-15 ENCOUNTER — Other Ambulatory Visit: Payer: Self-pay

## 2023-08-15 ENCOUNTER — Emergency Department (HOSPITAL_COMMUNITY): Payer: Medicare Other

## 2023-08-15 DIAGNOSIS — I129 Hypertensive chronic kidney disease with stage 1 through stage 4 chronic kidney disease, or unspecified chronic kidney disease: Secondary | ICD-10-CM | POA: Diagnosis present

## 2023-08-15 DIAGNOSIS — R278 Other lack of coordination: Secondary | ICD-10-CM | POA: Diagnosis not present

## 2023-08-15 DIAGNOSIS — K219 Gastro-esophageal reflux disease without esophagitis: Secondary | ICD-10-CM | POA: Diagnosis present

## 2023-08-15 DIAGNOSIS — J09X2 Influenza due to identified novel influenza A virus with other respiratory manifestations: Secondary | ICD-10-CM | POA: Diagnosis not present

## 2023-08-15 DIAGNOSIS — M6281 Muscle weakness (generalized): Secondary | ICD-10-CM | POA: Diagnosis not present

## 2023-08-15 DIAGNOSIS — J849 Interstitial pulmonary disease, unspecified: Secondary | ICD-10-CM | POA: Diagnosis present

## 2023-08-15 DIAGNOSIS — R0902 Hypoxemia: Secondary | ICD-10-CM

## 2023-08-15 DIAGNOSIS — M47816 Spondylosis without myelopathy or radiculopathy, lumbar region: Secondary | ICD-10-CM | POA: Diagnosis not present

## 2023-08-15 DIAGNOSIS — J984 Other disorders of lung: Secondary | ICD-10-CM | POA: Diagnosis present

## 2023-08-15 DIAGNOSIS — Z79899 Other long term (current) drug therapy: Secondary | ICD-10-CM

## 2023-08-15 DIAGNOSIS — E861 Hypovolemia: Secondary | ICD-10-CM | POA: Diagnosis present

## 2023-08-15 DIAGNOSIS — N179 Acute kidney failure, unspecified: Secondary | ICD-10-CM | POA: Diagnosis not present

## 2023-08-15 DIAGNOSIS — J101 Influenza due to other identified influenza virus with other respiratory manifestations: Principal | ICD-10-CM | POA: Diagnosis present

## 2023-08-15 DIAGNOSIS — Z1152 Encounter for screening for COVID-19: Secondary | ICD-10-CM

## 2023-08-15 DIAGNOSIS — Z825 Family history of asthma and other chronic lower respiratory diseases: Secondary | ICD-10-CM | POA: Diagnosis not present

## 2023-08-15 DIAGNOSIS — R509 Fever, unspecified: Secondary | ICD-10-CM | POA: Diagnosis not present

## 2023-08-15 DIAGNOSIS — W19XXXA Unspecified fall, initial encounter: Secondary | ICD-10-CM | POA: Diagnosis present

## 2023-08-15 DIAGNOSIS — Z880 Allergy status to penicillin: Secondary | ICD-10-CM

## 2023-08-15 DIAGNOSIS — Z888 Allergy status to other drugs, medicaments and biological substances status: Secondary | ICD-10-CM

## 2023-08-15 DIAGNOSIS — J111 Influenza due to unidentified influenza virus with other respiratory manifestations: Secondary | ICD-10-CM | POA: Diagnosis not present

## 2023-08-15 DIAGNOSIS — Z66 Do not resuscitate: Secondary | ICD-10-CM | POA: Diagnosis present

## 2023-08-15 DIAGNOSIS — J9601 Acute respiratory failure with hypoxia: Secondary | ICD-10-CM | POA: Diagnosis present

## 2023-08-15 DIAGNOSIS — R110 Nausea: Secondary | ICD-10-CM | POA: Diagnosis not present

## 2023-08-15 DIAGNOSIS — Y92009 Unspecified place in unspecified non-institutional (private) residence as the place of occurrence of the external cause: Secondary | ICD-10-CM | POA: Diagnosis not present

## 2023-08-15 DIAGNOSIS — F329 Major depressive disorder, single episode, unspecified: Secondary | ICD-10-CM | POA: Diagnosis not present

## 2023-08-15 DIAGNOSIS — I341 Nonrheumatic mitral (valve) prolapse: Secondary | ICD-10-CM | POA: Diagnosis present

## 2023-08-15 DIAGNOSIS — E785 Hyperlipidemia, unspecified: Secondary | ICD-10-CM | POA: Diagnosis present

## 2023-08-15 DIAGNOSIS — M1990 Unspecified osteoarthritis, unspecified site: Secondary | ICD-10-CM | POA: Diagnosis present

## 2023-08-15 DIAGNOSIS — R112 Nausea with vomiting, unspecified: Secondary | ICD-10-CM | POA: Diagnosis not present

## 2023-08-15 DIAGNOSIS — N181 Chronic kidney disease, stage 1: Secondary | ICD-10-CM | POA: Diagnosis present

## 2023-08-15 DIAGNOSIS — F419 Anxiety disorder, unspecified: Secondary | ICD-10-CM | POA: Diagnosis present

## 2023-08-15 DIAGNOSIS — E871 Hypo-osmolality and hyponatremia: Secondary | ICD-10-CM | POA: Diagnosis not present

## 2023-08-15 DIAGNOSIS — R2689 Other abnormalities of gait and mobility: Secondary | ICD-10-CM | POA: Diagnosis not present

## 2023-08-15 DIAGNOSIS — J8410 Pulmonary fibrosis, unspecified: Secondary | ICD-10-CM | POA: Diagnosis not present

## 2023-08-15 DIAGNOSIS — R1111 Vomiting without nausea: Secondary | ICD-10-CM | POA: Diagnosis not present

## 2023-08-15 DIAGNOSIS — R531 Weakness: Secondary | ICD-10-CM | POA: Diagnosis not present

## 2023-08-15 DIAGNOSIS — J40 Bronchitis, not specified as acute or chronic: Secondary | ICD-10-CM | POA: Diagnosis not present

## 2023-08-15 DIAGNOSIS — J9621 Acute and chronic respiratory failure with hypoxia: Secondary | ICD-10-CM | POA: Diagnosis not present

## 2023-08-15 DIAGNOSIS — J479 Bronchiectasis, uncomplicated: Secondary | ICD-10-CM | POA: Diagnosis not present

## 2023-08-15 DIAGNOSIS — M160 Bilateral primary osteoarthritis of hip: Secondary | ICD-10-CM | POA: Diagnosis not present

## 2023-08-15 DIAGNOSIS — M069 Rheumatoid arthritis, unspecified: Secondary | ICD-10-CM | POA: Diagnosis present

## 2023-08-15 DIAGNOSIS — Z7401 Bed confinement status: Secondary | ICD-10-CM | POA: Diagnosis not present

## 2023-08-15 DIAGNOSIS — Z043 Encounter for examination and observation following other accident: Secondary | ICD-10-CM | POA: Diagnosis not present

## 2023-08-15 DIAGNOSIS — Z9049 Acquired absence of other specified parts of digestive tract: Secondary | ICD-10-CM | POA: Diagnosis not present

## 2023-08-15 DIAGNOSIS — J45909 Unspecified asthma, uncomplicated: Secondary | ICD-10-CM | POA: Diagnosis present

## 2023-08-15 DIAGNOSIS — M810 Age-related osteoporosis without current pathological fracture: Secondary | ICD-10-CM | POA: Diagnosis present

## 2023-08-15 DIAGNOSIS — R062 Wheezing: Secondary | ICD-10-CM | POA: Diagnosis not present

## 2023-08-15 DIAGNOSIS — E43 Unspecified severe protein-calorie malnutrition: Secondary | ICD-10-CM | POA: Diagnosis not present

## 2023-08-15 DIAGNOSIS — Z79620 Long term (current) use of immunosuppressive biologic: Secondary | ICD-10-CM

## 2023-08-15 DIAGNOSIS — I10 Essential (primary) hypertension: Secondary | ICD-10-CM | POA: Diagnosis not present

## 2023-08-15 LAB — COMPREHENSIVE METABOLIC PANEL
ALT: 28 U/L (ref 0–44)
AST: 83 U/L — ABNORMAL HIGH (ref 15–41)
Albumin: 3.6 g/dL (ref 3.5–5.0)
Alkaline Phosphatase: 78 U/L (ref 38–126)
Anion gap: 10 (ref 5–15)
BUN: 20 mg/dL (ref 8–23)
CO2: 26 mmol/L (ref 22–32)
Calcium: 8.1 mg/dL — ABNORMAL LOW (ref 8.9–10.3)
Chloride: 92 mmol/L — ABNORMAL LOW (ref 98–111)
Creatinine, Ser: 1.02 mg/dL — ABNORMAL HIGH (ref 0.44–1.00)
GFR, Estimated: 55 mL/min — ABNORMAL LOW (ref >60)
Glucose, Bld: 86 mg/dL (ref 70–99)
Potassium: 4.2 mmol/L (ref 3.5–5.1)
Sodium: 128 mmol/L — ABNORMAL LOW (ref 135–145)
Total Bilirubin: 0.7 mg/dL (ref 0.0–1.2)
Total Protein: 6.5 g/dL (ref 6.5–8.1)

## 2023-08-15 LAB — CBC WITH DIFFERENTIAL/PLATELET
Abs Immature Granulocytes: 0.04 10*3/uL (ref 0.00–0.07)
Basophils Absolute: 0.1 10*3/uL (ref 0.0–0.1)
Basophils Relative: 1 %
Eosinophils Absolute: 0.1 10*3/uL (ref 0.0–0.5)
Eosinophils Relative: 1 %
HCT: 39.8 % (ref 36.0–46.0)
Hemoglobin: 12.6 g/dL (ref 12.0–15.0)
Immature Granulocytes: 1 %
Lymphocytes Relative: 17 %
Lymphs Abs: 1.1 10*3/uL (ref 0.7–4.0)
MCH: 29.2 pg (ref 26.0–34.0)
MCHC: 31.7 g/dL (ref 30.0–36.0)
MCV: 92.3 fL (ref 80.0–100.0)
Monocytes Absolute: 1.3 10*3/uL — ABNORMAL HIGH (ref 0.1–1.0)
Monocytes Relative: 19 %
Neutro Abs: 4.1 10*3/uL (ref 1.7–7.7)
Neutrophils Relative %: 61 %
Platelets: 169 10*3/uL (ref 150–400)
RBC: 4.31 MIL/uL (ref 3.87–5.11)
RDW: 14.1 % (ref 11.5–15.5)
WBC: 6.7 10*3/uL (ref 4.0–10.5)
nRBC: 0 % (ref 0.0–0.2)

## 2023-08-15 LAB — RESP PANEL BY RT-PCR (RSV, FLU A&B, COVID)  RVPGX2
Influenza A by PCR: POSITIVE — AB
Influenza B by PCR: NEGATIVE
Resp Syncytial Virus by PCR: NEGATIVE
SARS Coronavirus 2 by RT PCR: NEGATIVE

## 2023-08-15 MED ORDER — SODIUM CHLORIDE 0.9 % IV SOLN: Freq: Once | INTRAVENOUS | Status: AC

## 2023-08-15 MED ORDER — OSELTAMIVIR PHOSPHATE 75 MG PO CAPS
75.0000 mg | ORAL_CAPSULE | Freq: Once | ORAL | Status: AC
  Administered 2023-08-16: 75 mg via ORAL
  Filled 2023-08-15: qty 1

## 2023-08-15 MED ORDER — SODIUM CHLORIDE 0.9 % IV BOLUS
500.0000 mL | Freq: Once | INTRAVENOUS | Status: AC
  Administered 2023-08-15: 500 mL via INTRAVENOUS

## 2023-08-15 MED ORDER — ONDANSETRON HCL 4 MG/2ML IJ SOLN
4.0000 mg | Freq: Once | INTRAMUSCULAR | Status: AC
  Administered 2023-08-15: 4 mg via INTRAVENOUS
  Filled 2023-08-15: qty 2

## 2023-08-15 NOTE — ED Provider Notes (Signed)
[place] EMERGENCY DEPARTMENT AT [facility] Provider Note   CSN: [id number] Arrival date & time: 08/15/23  1947     History  Chief Complaint  Patient presents with   flu like symptoms     Stacey [name] is a 83 y.o. female.  83 year old female with prior medical history as detailed below presents for evaluation.  Patient arrives with EMS transport from [facility] independent living.  Patient with cough, congestion, fatigue x 1 to 2 days.  Patient was noted to be hypoxic to 88% on room air with EMS.  Patient was given 2 L nasal cannula during transport with improvement in O2 saturation 97%.  Patient reports that she feels much improved with supplemental O2.  Patient with nausea and vomiting over the last 24 hours as well.  Patient denies chest pain.  She denies fever.  She reports a negative COVID test at her facility.  She was not tested for flu.  Medical history significant for interstitial lung disease, rheumatoid arthritis, GERD.  The history is provided by the patient and medical records.       Home Medications Prior to Admission medications   Medication Sig Start Date End Date Taking? Authorizing Provider  acetaminophen (TYLENOL) 500 MG tablet Take 1,000 mg by mouth every 6 (six) hours as needed for moderate pain (pain score 4-6).    [provider]  amitriptyline (ELAVIL) 25 MG tablet Take 25 mg by mouth at bedtime. 02/04/15   [provider]  BIOTIN PO Take 1 tablet by mouth daily.    [provider]  Cholecalciferol (VITAMIN D-3 PO) Take 1 capsule by mouth daily.    [provider]  feeding supplement (ENSURE ENLIVE / ENSURE PLUS) LIQD Take 237 mLs by mouth 2 (two) times daily between meals. 06/25/23   Kathlen Mody, MD  gabapentin (NEURONTIN) 300 MG capsule Take 600 mg by mouth at bedtime.    [provider]  losartan (COZAAR) 25 MG tablet Take 25 mg by mouth daily. 02/04/21   [provider]   methylPREDNISolone (MEDROL) 4 MG tablet Take 1 tablet (4 mg total) by mouth every other day. Alternate with the 2mg  dose Patient taking differently: Take 2-4 mg by mouth See admin instructions. Alternate taking 2 mg (1/2 tablet) and 4 mg (1 tablet) every other day in the moring. 06/22/19   Lonia Blood, MD  Multiple Vitamins-Minerals (EYE VITAMINS) CAPS Take 1 capsule by mouth daily.    [provider]  Nintedanib (OFEV) 100 MG CAPS Take 1 capsule (100 mg total) by mouth 2 (two) times daily. 12/02/22   Hughie Closs, MD  Nutritional Supplements (,FEEDING SUPPLEMENT, PROSOURCE PLUS) liquid Take 30 mLs by mouth 2 (two) times daily between meals. 06/25/23   Kathlen Mody, MD  ondansetron (ZOFRAN) 4 MG tablet TAKE ONE TABLET BY MOUTH EVERY 8 HOURS AS NEEDED FOR NAUSEA AND VOMITING Patient taking differently: Take 4 mg by mouth See admin instructions. Take 1 tablet (4mg ) every morning and may take up to 2 more tablets during the day (up to three tablets daily) if needed for nausea. 03/17/23   Kalman Shan, MD  polyethylene glycol (MIRALAX / GLYCOLAX) 17 g packet Take 17 g by mouth daily as needed for mild constipation. 06/25/23   Kathlen Mody, MD  pravastatin (PRAVACHOL) 20 MG tablet Take 1 tablet (20 mg total) by mouth daily. 02/10/23   Jodelle Red, MD  riTUXimab (RITUXAN IV) Inject 1,000 mg into the vein every 6 (six)  months.    [provider]  sulfamethoxazole-trimethoprim (BACTRIM DS) 800-160 MG tablet Take 1 tablet by mouth 3 (three) times a week.    [provider]  venlafaxine XR (EFFEXOR-XR) 75 MG 24 hr capsule Take 75 mg by mouth at bedtime.     [provider]      Allergies    Penicillins and Remicade [infliximab]    Review of Systems   Review of Systems  All other systems reviewed and are negative.   Physical Exam Updated Vital Signs BP (!) 150/137   Pulse (!) 48   Temp 99.5 F (37.5 C) (Oral)   Resp 18   Ht 5\' 6"  (1.676 m)    Wt 59.4 kg   SpO2 100%   BMI 21.14 kg/m  Physical Exam Vitals and nursing note reviewed.  Constitutional:      General: She is not in acute distress.    Appearance: Normal appearance. She is well-developed.  HENT:     Head: Normocephalic and atraumatic.     Mouth/Throat:     Mouth: Mucous membranes are dry.  Eyes:     Conjunctiva/sclera: Conjunctivae normal.     Pupils: Pupils are equal, round, and reactive to light.  Cardiovascular:     Rate and Rhythm: Normal rate and regular rhythm.     Heart sounds: Normal heart sounds.  Pulmonary:     Effort: Pulmonary effort is normal. No respiratory distress.     Breath sounds: Normal breath sounds.  Abdominal:     General: There is no distension.     Palpations: Abdomen is soft.     Tenderness: There is no abdominal tenderness.  Musculoskeletal:        General: No deformity. Normal range of motion.     Cervical back: Normal range of motion and neck supple.  Skin:    General: Skin is warm and dry.  Neurological:     General: No focal deficit present.     Mental Status: She is alert and oriented to person, place, and time.     ED Results / Procedures / Treatments   Labs (all labs ordered are listed, but only abnormal results are displayed) Labs Reviewed  COMPREHENSIVE METABOLIC PANEL - Abnormal; Notable for the following components:      Result Value   Sodium 128 (*)    Chloride 92 (*)    Creatinine, Ser 1.02 (*)    Calcium 8.1 (*)    AST 83 (*)    GFR, Estimated 55 (*)    All other components within normal limits  RESP PANEL BY RT-PCR (RSV, FLU A&B, COVID)  RVPGX2  CULTURE, BLOOD (ROUTINE X 2)  CULTURE, BLOOD (ROUTINE X 2)  CBC WITH DIFFERENTIAL/PLATELET  URINALYSIS, W/ REFLEX TO CULTURE (INFECTION SUSPECTED)  I-STAT CG4 LACTIC ACID, ED  I-STAT CG4 LACTIC ACID, ED    EKG None  Radiology DG Chest Port 1 View Result Date: 08/15/2023 CLINICAL DATA:  Shortness of breath. Cough, congestion, and fatigue. Negative  COVID test. Wheezing. Decreased oxygenation. EXAM: PORTABLE CHEST 1 VIEW COMPARISON:  06/24/2023 FINDINGS: Heart size and pulmonary vascularity are normal. Bronchiectasis and interstitial changes in the lungs likely representing chronic bronchitis and fibrosis. Similar appearance to previous studies. No airspace disease or consolidation. No pleural effusion or pneumothorax. Mediastinal contours appear intact. Old right rib fractures. Surgical clips in the right upper quadrant. Calcification of the aorta. IMPRESSION: Chronic bronchitic changes in the lungs. 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 No focal consolidation.  Electronically Signed   By: Burman Nieves M.D.   On: 08/15/2023 21:41    Procedures Procedures    Medications Ordered in ED Medications  sodium chloride 0.9 % bolus 500 mL (500 mLs Intravenous New Bag/Given 08/15/23 2112)  ondansetron [facility]) injection 4 mg (4 mg Intravenous Given 08/15/23 2113)    ED Course/ Medical Decision Making/ A&P                                 Medical Decision Making Amount and/or Complexity of Data Reviewed Labs: ordered. Radiology: ordered.  Risk Prescription drug management.    Medical Screen Complete  This patient presented to the ED with complaint of malaise, fatigue, nausea, vomiting, shortness of breath.  This complaint involves an extensive number of treatment options. The initial differential diagnosis includes, but is not limited to, bacterial versus viral infection, AKI, metabolic abnormality, etc.  This presentation is: Acute, Chronic, Self-Limited, Previously Undiagnosed, Uncertain Prognosis, Complicated, Systemic Symptoms, and Threat to Life/Bodily Function  Patient with history of interstitial lung disease, RA, GERD presents with approximately 24 to 48 hours of worsening weakness, fatigue, nausea, vomiting.  Patient is hypoxic to 88% on room air with initial EMS evaluation.  This is improved with supplemental O2 at 2 L nasal  cannula.   Patient influenza test is positive.  Screening labs admitted again for sodium of 128, creatinine 1.02 today.  Patient would benefit from admission.  Hospitalist service made aware of case.   Additional history obtained:  Additional [facility] obtained from [facility] External records from outside sources obtained and reviewed including prior ED visits and prior Inpatient records.    Lab Tests:  I ordered and personally interpreted labs.  The pertinent results include: CBC, CMP, COVID, flu, RSV, UA   Imaging Studies ordered:  I ordered imaging studies including chest x-ray I independently visualized and interpreted obtained imaging which showed NAD I agree with the radiologist interpretation.   Cardiac Monitoring:  The patient was maintained on a cardiac monitor.  I personally viewed and interpreted the cardiac monitor which showed an underlying rhythm of: NSR   Medicines ordered:  I ordered medication including Zofran, IV fluids for suspected dehydration Reevaluation of the patient after these medicines showed that the patient: improved    Problem List / ED Course:  Weakness, fatigue, vomiting, hyponatremia   Reevaluation:  After the interventions noted above, I reevaluated the patient and found that they have: improved  Disposition:  After consideration of the diagnostic results and the patients response to treatment, I feel that the patent would benefit from admission.          Final Clinical Impression(s) / ED Diagnoses Final diagnoses:  Weakness  Hyponatremia  Hypoxia  Influenza    Rx / DC Orders ED Discharge Orders     None         Wynetta Fines, MD 08/15/23 2313

## 2023-08-15 NOTE — ED Triage Notes (Signed)
Pt BIB [facility] from [facility] for cough, congestion, fatigue, pt negative for Covid at facility, they do not test for flu, per EMS no SOB complaint but pt with wheezing, O2 RA 88%, 2L 97%, v/s en route 168/86, HR 80, RR 20, CBG 110

## 2023-08-16 ENCOUNTER — Encounter (HOSPITAL_COMMUNITY): Payer: Self-pay | Admitting: Internal Medicine

## 2023-08-16 ENCOUNTER — Observation Stay (HOSPITAL_COMMUNITY): Payer: Medicare Other

## 2023-08-16 DIAGNOSIS — Z66 Do not resuscitate: Secondary | ICD-10-CM | POA: Diagnosis present

## 2023-08-16 DIAGNOSIS — K219 Gastro-esophageal reflux disease without esophagitis: Secondary | ICD-10-CM | POA: Diagnosis present

## 2023-08-16 DIAGNOSIS — F419 Anxiety disorder, unspecified: Secondary | ICD-10-CM | POA: Diagnosis present

## 2023-08-16 DIAGNOSIS — Z9049 Acquired absence of other specified parts of digestive tract: Secondary | ICD-10-CM | POA: Diagnosis not present

## 2023-08-16 DIAGNOSIS — E861 Hypovolemia: Secondary | ICD-10-CM | POA: Diagnosis present

## 2023-08-16 DIAGNOSIS — F329 Major depressive disorder, single episode, unspecified: Secondary | ICD-10-CM | POA: Diagnosis not present

## 2023-08-16 DIAGNOSIS — Z880 Allergy status to penicillin: Secondary | ICD-10-CM | POA: Diagnosis not present

## 2023-08-16 DIAGNOSIS — M47816 Spondylosis without myelopathy or radiculopathy, lumbar region: Secondary | ICD-10-CM | POA: Diagnosis not present

## 2023-08-16 DIAGNOSIS — J101 Influenza due to other identified influenza virus with other respiratory manifestations: Secondary | ICD-10-CM | POA: Insufficient documentation

## 2023-08-16 DIAGNOSIS — M160 Bilateral primary osteoarthritis of hip: Secondary | ICD-10-CM | POA: Diagnosis not present

## 2023-08-16 DIAGNOSIS — R112 Nausea with vomiting, unspecified: Secondary | ICD-10-CM

## 2023-08-16 DIAGNOSIS — M1990 Unspecified osteoarthritis, unspecified site: Secondary | ICD-10-CM | POA: Diagnosis present

## 2023-08-16 DIAGNOSIS — E871 Hypo-osmolality and hyponatremia: Secondary | ICD-10-CM | POA: Diagnosis present

## 2023-08-16 DIAGNOSIS — M069 Rheumatoid arthritis, unspecified: Secondary | ICD-10-CM | POA: Diagnosis present

## 2023-08-16 DIAGNOSIS — M6281 Muscle weakness (generalized): Secondary | ICD-10-CM | POA: Diagnosis not present

## 2023-08-16 DIAGNOSIS — Y92009 Unspecified place in unspecified non-institutional (private) residence as the place of occurrence of the external cause: Secondary | ICD-10-CM | POA: Diagnosis not present

## 2023-08-16 DIAGNOSIS — R278 Other lack of coordination: Secondary | ICD-10-CM | POA: Diagnosis not present

## 2023-08-16 DIAGNOSIS — J9601 Acute respiratory failure with hypoxia: Secondary | ICD-10-CM

## 2023-08-16 DIAGNOSIS — Z79899 Other long term (current) drug therapy: Secondary | ICD-10-CM | POA: Diagnosis not present

## 2023-08-16 DIAGNOSIS — J849 Interstitial pulmonary disease, unspecified: Secondary | ICD-10-CM | POA: Diagnosis present

## 2023-08-16 DIAGNOSIS — J9621 Acute and chronic respiratory failure with hypoxia: Secondary | ICD-10-CM | POA: Diagnosis not present

## 2023-08-16 DIAGNOSIS — M810 Age-related osteoporosis without current pathological fracture: Secondary | ICD-10-CM | POA: Diagnosis present

## 2023-08-16 DIAGNOSIS — Z825 Family history of asthma and other chronic lower respiratory diseases: Secondary | ICD-10-CM | POA: Diagnosis not present

## 2023-08-16 DIAGNOSIS — N179 Acute kidney failure, unspecified: Secondary | ICD-10-CM | POA: Diagnosis present

## 2023-08-16 DIAGNOSIS — R2689 Other abnormalities of gait and mobility: Secondary | ICD-10-CM | POA: Diagnosis not present

## 2023-08-16 DIAGNOSIS — Z1152 Encounter for screening for COVID-19: Secondary | ICD-10-CM | POA: Diagnosis not present

## 2023-08-16 DIAGNOSIS — I10 Essential (primary) hypertension: Secondary | ICD-10-CM | POA: Diagnosis not present

## 2023-08-16 DIAGNOSIS — W19XXXA Unspecified fall, initial encounter: Secondary | ICD-10-CM | POA: Diagnosis present

## 2023-08-16 DIAGNOSIS — E785 Hyperlipidemia, unspecified: Secondary | ICD-10-CM | POA: Diagnosis present

## 2023-08-16 DIAGNOSIS — J45909 Unspecified asthma, uncomplicated: Secondary | ICD-10-CM | POA: Diagnosis present

## 2023-08-16 DIAGNOSIS — I341 Nonrheumatic mitral (valve) prolapse: Secondary | ICD-10-CM | POA: Diagnosis present

## 2023-08-16 DIAGNOSIS — Z043 Encounter for examination and observation following other accident: Secondary | ICD-10-CM | POA: Diagnosis not present

## 2023-08-16 DIAGNOSIS — J984 Other disorders of lung: Secondary | ICD-10-CM | POA: Diagnosis present

## 2023-08-16 DIAGNOSIS — J111 Influenza due to unidentified influenza virus with other respiratory manifestations: Secondary | ICD-10-CM | POA: Diagnosis not present

## 2023-08-16 DIAGNOSIS — R509 Fever, unspecified: Secondary | ICD-10-CM | POA: Diagnosis not present

## 2023-08-16 DIAGNOSIS — I129 Hypertensive chronic kidney disease with stage 1 through stage 4 chronic kidney disease, or unspecified chronic kidney disease: Secondary | ICD-10-CM | POA: Diagnosis present

## 2023-08-16 DIAGNOSIS — N181 Chronic kidney disease, stage 1: Secondary | ICD-10-CM | POA: Diagnosis present

## 2023-08-16 DIAGNOSIS — J09X2 Influenza due to identified novel influenza A virus with other respiratory manifestations: Secondary | ICD-10-CM | POA: Diagnosis not present

## 2023-08-16 DIAGNOSIS — R531 Weakness: Secondary | ICD-10-CM | POA: Diagnosis not present

## 2023-08-16 DIAGNOSIS — Z888 Allergy status to other drugs, medicaments and biological substances status: Secondary | ICD-10-CM | POA: Diagnosis not present

## 2023-08-16 DIAGNOSIS — Z7401 Bed confinement status: Secondary | ICD-10-CM | POA: Diagnosis not present

## 2023-08-16 DIAGNOSIS — E43 Unspecified severe protein-calorie malnutrition: Secondary | ICD-10-CM | POA: Diagnosis not present

## 2023-08-16 LAB — COMPREHENSIVE METABOLIC PANEL
ALT: 21 U/L (ref 0–44)
AST: 78 U/L — ABNORMAL HIGH (ref 15–41)
Albumin: 3 g/dL — ABNORMAL LOW (ref 3.5–5.0)
Alkaline Phosphatase: 43 U/L (ref 38–126)
Anion gap: 10 (ref 5–15)
BUN: 18 mg/dL (ref 8–23)
CO2: 22 mmol/L (ref 22–32)
Calcium: 7.3 mg/dL — ABNORMAL LOW (ref 8.9–10.3)
Chloride: 102 mmol/L (ref 98–111)
Creatinine, Ser: 0.77 mg/dL (ref 0.44–1.00)
GFR, Estimated: >60 mL/min (ref >60)
Glucose, Bld: 73 mg/dL (ref 70–99)
Potassium: 3.8 mmol/L (ref 3.5–5.1)
Sodium: 134 mmol/L — ABNORMAL LOW (ref 135–145)
Total Bilirubin: 0.6 mg/dL (ref 0.0–1.2)
Total Protein: 5.4 g/dL — ABNORMAL LOW (ref 6.5–8.1)

## 2023-08-16 LAB — URINALYSIS, W/ REFLEX TO CULTURE (INFECTION SUSPECTED)
Bilirubin Urine: NEGATIVE
Glucose, UA: NEGATIVE mg/dL
Ketones, ur: 20 mg/dL — AB
Nitrite: NEGATIVE
Protein, ur: 30 mg/dL — AB
Specific Gravity, Urine: 1.024 (ref 1.005–1.030)
WBC, UA: >50 WBC/hpf (ref 0–5)
pH: 5 (ref 5.0–8.0)

## 2023-08-16 LAB — CBC
HCT: 34.5 % — ABNORMAL LOW (ref 36.0–46.0)
HCT: 35.5 % — ABNORMAL LOW (ref 36.0–46.0)
Hemoglobin: 11.1 g/dL — ABNORMAL LOW (ref 12.0–15.0)
Hemoglobin: 11.4 g/dL — ABNORMAL LOW (ref 12.0–15.0)
MCH: 29.5 pg (ref 26.0–34.0)
MCH: 29.9 pg (ref 26.0–34.0)
MCHC: 32.2 g/dL (ref 30.0–36.0)
MCHC: 32.1 g/dL (ref 30.0–36.0)
MCV: 91.8 fL (ref 80.0–100.0)
MCV: 93.2 fL (ref 80.0–100.0)
Platelets: 152 10*3/uL (ref 150–400)
Platelets: 142 10*3/uL — ABNORMAL LOW (ref 150–400)
RBC: 3.76 MIL/uL — ABNORMAL LOW (ref 3.87–5.11)
RBC: 3.81 MIL/uL — ABNORMAL LOW (ref 3.87–5.11)
RDW: 14.1 % (ref 11.5–15.5)
RDW: 14.1 % (ref 11.5–15.5)
WBC: 6 10*3/uL (ref 4.0–10.5)
WBC: 7.1 10*3/uL (ref 4.0–10.5)
nRBC: 0 % (ref 0.0–0.2)
nRBC: 0 % (ref 0.0–0.2)

## 2023-08-16 LAB — CREATININE, SERUM
Creatinine, Ser: 0.71 mg/dL (ref 0.44–1.00)
GFR, Estimated: >60 mL/min (ref >60)

## 2023-08-16 LAB — LACTIC ACID, PLASMA: Lactic Acid, Venous: 0.8 mmol/L (ref 0.5–1.9)

## 2023-08-16 MED ORDER — LOSARTAN POTASSIUM 50 MG PO TABS
25.0000 mg | ORAL_TABLET | Freq: Every day | ORAL | Status: DC
  Administered 2023-08-16 – 2023-08-17 (×2): 25 mg via ORAL
  Filled 2023-08-16 (×2): qty 1

## 2023-08-16 MED ORDER — POLYETHYLENE GLYCOL 3350 17 G PO PACK: 17.0000 g | PACK | Freq: Every day | ORAL | Status: DC | PRN

## 2023-08-16 MED ORDER — NINTEDANIB ESYLATE 100 MG PO CAPS: 1.0000 | ORAL_CAPSULE | Freq: Two times a day (BID) | ORAL | Status: DC

## 2023-08-16 MED ORDER — AMLODIPINE BESYLATE 5 MG PO TABS: 5.0000 mg | ORAL_TABLET | Freq: Every day | ORAL | Status: DC

## 2023-08-16 MED ORDER — ACETAMINOPHEN 650 MG RE SUPP: 650.0000 mg | Freq: Four times a day (QID) | RECTAL | Status: DC | PRN

## 2023-08-16 MED ORDER — SODIUM CHLORIDE 0.9 % IV SOLN
INTRAVENOUS | Status: AC
  Administered 2023-08-16: 800 mL via INTRAVENOUS

## 2023-08-16 MED ORDER — PRAVASTATIN SODIUM 20 MG PO TABS
20.0000 mg | ORAL_TABLET | Freq: Every day | ORAL | Status: DC
  Administered 2023-08-16 – 2023-08-17 (×2): 20 mg via ORAL
  Filled 2023-08-16 (×2): qty 1

## 2023-08-16 MED ORDER — ONDANSETRON HCL 4 MG PO TABS: 4.0000 mg | ORAL_TABLET | Freq: Four times a day (QID) | ORAL | Status: DC | PRN

## 2023-08-16 MED ORDER — ENSURE ENLIVE PO LIQD
237.0000 mL | Freq: Two times a day (BID) | ORAL | Status: DC
Start: 2023-08-16 — End: 2023-08-17
  Administered 2023-08-16 – 2023-08-17 (×4): 237 mL via ORAL
  Filled 2023-08-16 (×2): qty 237

## 2023-08-16 MED ORDER — ENOXAPARIN SODIUM 40 MG/0.4ML IJ SOSY
40.0000 mg | PREFILLED_SYRINGE | INTRAMUSCULAR | Status: DC
  Administered 2023-08-16 – 2023-08-17 (×2): 40 mg via SUBCUTANEOUS
  Filled 2023-08-16 (×2): qty 0.4

## 2023-08-16 MED ORDER — GABAPENTIN 300 MG PO CAPS
300.0000 mg | ORAL_CAPSULE | Freq: Every day | ORAL | Status: DC
  Administered 2023-08-16: 300 mg via ORAL
  Filled 2023-08-16: qty 1

## 2023-08-16 MED ORDER — ACETAMINOPHEN 325 MG PO TABS
650.0000 mg | ORAL_TABLET | Freq: Four times a day (QID) | ORAL | Status: DC | PRN
  Administered 2023-08-17: 650 mg via ORAL
  Filled 2023-08-16: qty 2

## 2023-08-16 MED ORDER — ONDANSETRON HCL 4 MG/2ML IJ SOLN
4.0000 mg | Freq: Four times a day (QID) | INTRAMUSCULAR | Status: DC | PRN
  Administered 2023-08-16: 4 mg via INTRAVENOUS
  Filled 2023-08-16: qty 2

## 2023-08-16 MED ORDER — SODIUM CHLORIDE 0.9 % IV SOLN
INTRAVENOUS | Status: DC
Start: 2023-08-16 — End: 2023-08-16

## 2023-08-16 MED ORDER — AMITRIPTYLINE HCL 25 MG PO TABS
25.0000 mg | ORAL_TABLET | Freq: Every day | ORAL | Status: DC
  Administered 2023-08-16: 25 mg via ORAL
  Filled 2023-08-16: qty 1

## 2023-08-16 MED ORDER — OSELTAMIVIR PHOSPHATE 30 MG PO CAPS
30.0000 mg | ORAL_CAPSULE | Freq: Two times a day (BID) | ORAL | Status: DC
  Administered 2023-08-16 – 2023-08-17 (×3): 30 mg via ORAL
  Filled 2023-08-16 (×4): qty 1

## 2023-08-16 MED ORDER — POLYETHYLENE GLYCOL 3350 17 G PO PACK
17.0000 g | PACK | Freq: Two times a day (BID) | ORAL | Status: DC
  Administered 2023-08-17: 17 g via ORAL
  Filled 2023-08-16 (×3): qty 1

## 2023-08-16 MED ORDER — PANTOPRAZOLE SODIUM 40 MG PO TBEC
40.0000 mg | DELAYED_RELEASE_TABLET | Freq: Every day | ORAL | Status: DC
  Administered 2023-08-16 – 2023-08-17 (×2): 40 mg via ORAL
  Filled 2023-08-16 (×2): qty 1

## 2023-08-16 MED ORDER — GABAPENTIN 300 MG PO CAPS
600.0000 mg | ORAL_CAPSULE | Freq: Every day | ORAL | Status: DC
  Administered 2023-08-16: 600 mg via ORAL
  Filled 2023-08-16: qty 2

## 2023-08-16 MED ORDER — HYDRALAZINE HCL 10 MG PO TABS: 10.0000 mg | ORAL_TABLET | Freq: Four times a day (QID) | ORAL | Status: DC | PRN

## 2023-08-16 MED ORDER — VENLAFAXINE HCL ER 75 MG PO CP24
75.0000 mg | ORAL_CAPSULE | Freq: Every day | ORAL | Status: DC
  Administered 2023-08-16: 75 mg via ORAL
  Filled 2023-08-16: qty 1

## 2023-08-16 MED ORDER — SODIUM CHLORIDE 0.9 % IV SOLN: INTRAVENOUS | Status: DC

## 2023-08-16 MED ORDER — BISACODYL 10 MG RE SUPP
10.0000 mg | Freq: Once | RECTAL | Status: AC
  Administered 2023-08-16: 10 mg via RECTAL
  Filled 2023-08-16: qty 1

## 2023-08-16 NOTE — TOC CM/SW Note (Signed)
 CMS list of facilities and star ratings provided to pt to review for facility preference.       [facility] [address] [place], [place] [address] [phone] Overall rating ??  Below average  [facility] at the [facility] [address] [place], [place] [address] [phone] Overall rating ? Below average  [facility] [address] [place], [place] [address] [phone] Overall rating? Below average  [facility] [address] [place], [place] [address] [phone] Overall rating ? Much below average  [facility] [address] [place], [place] [address] [phone] Overall rating ???? Average  [facility] and [facility] [address] [place], [place] [address] [phone] Overall rating ? Much below average   [facility] [address] [place], [place] [address] [phone] Overall rating ?? Much below average  [employer] [facility] [employer] [facility] [address] [place], [place] [address] [phone] Overall rating ??? Average  [facility] [address] [place], [place] [address] [phone] Overall rating ? Much below average  [facility] [address] [place], [place] [address] [phone] Overall rating ?? Much below average  [facility] [address] [place], [place] [address] [phone] Overall rating ??? Above average  [facility] [address] [place], [place] [address] [phone] Overall rating ????? Much above average  [facility] [address] [place], [place] [address] [phone] Overall rating ???? Above average  [facility] [address] [place], [place]  [address] [phone] Overall rating ????? Much above average  [facility] [address] [place], [place] [address] [phone] Overall rating ????  [facility] [address] [place], [place] [address] [phone] Overall rating ???? Much above average  [place] [facility] [address] [place], [place] [address] [phone] Overall rating ????? Much above average  [facility] [address] [place], [place] [address] [phone] Overall rating ? Much below average  [facility] [address] [place], [place] [address] [phone] Overall rating ??? Average  [facility] [address] [place], [place] [address] [phone] Overall rating ????? Much above average  [facility] [address] [place], [place] [address] [phone] Overall rating ??? Average  [facility] [address] [place], [place] [address] [phone] Overall rating ?? Below average  [facility] [address] [place], [place] [address] [phone] Overall rating ??? Average  [facility] and [facility] [address] [place], [place] [address] [phone] Overall rating ????? Much above average  [facility] and [facility] [address] [place], [place] [address] [phone] Overall rating ? Much below average  [facility]	[place] at the [facility] [place], [place] [address] [phone] Overall rating ????? Much above average  [facility] [address] [place], [place] [address] [phone] Overall rating ??? Much below average  [facility] [address] [place], [place] [address] [phone] Overall rating ??? Average  [facility] and  [facility] [address] [place], [place] [address] [phone] Overall rating ??  Below average  [facility] and [facility] [address] [place], [place] [address] [phone] Overall rating ? Much below average  [facility] [address] [place], [place] [address] [phone] Overall rating ??? Average  [facility] [address] [place], [place] [address] [phone] Overall rating ? Much below average  [facility] [address] [place], [place] [address] [phone] Overall rating ??? Average  [facility] [address] [place], [place] [address] [phone] Overall rating ? Much below average  [facility] [address] [place], [place] [address] [phone] Overall rating ????? Much above average  [facility] [address] [place], [place] [address] [phone] Overall rating ? Much below average  [facility] [address] [place], [place] [address] [phone] Overall rating ???? Above average  [facility] [address] [place], [place] [address] [phone] Overall rating ????? Above average  [facility] [address] [place], [place] [address] [phone] Overall rating ???? Above average  [facility] [address] [place], [place] [address] [phone] Overall rating ????? Much above average  [facility] [address] [place], [place] [address] [phone] Overall rating ? Much below average  [facility] [address] [place], [place] [address] [phone] Overall rating ????? Much above average  [facility] [address] [place], [place] [address] [phone] Overall rating ? Much below  average  [facility] [address] [place], [place] [address] [phone] Overall rating ????? Much above average  [facility] [address] [place], [place] [address] [phone] Overall rating ????? Much above average  [facility] [address] [place], [place] [address] [phone] Overall rating ??? Average  [facility] [address] [place], [place] [address] [phone] Overall rating ??? Average  [facility] [address] [place], [place] [address] [phone] Overall rating ???? Above average  [facility] [address] [place], [place] [address] [phone] Overall rating ?? Below average  [facility] and [facility] [address] [place], [place] [address] [phone] Overall rating ???? Above average  [facility] and [facility] [address] [place], [place] [address] [phone] Overall rating ?? Below average  [facility] [address] [place], [place] [address] [phone] Overall rating ??

## 2023-08-16 NOTE — [place] FL2 (Signed)
[place] MEDICAID FL2 LEVEL OF CARE FORM     IDENTIFICATION  Patient Name: Stacey [name] Birthdate: 1941/02/16 Sex: female Admission Date (Current Location): 08/15/2023  [facility] [place] Number:  Producer, television/film/video and Address:  [facility],  [address] [place], [place] [address]      Provider Number: [id number]  Attending Physician Name and Address:  Marinda Elk, MD  Relative Name and Phone Number:  [place], [place] [phone])  [phone] [facility])    Current Level of Care: Hospital Recommended Level of Care: Skilled Nursing Facility Prior Approval Number:    Date Approved/Denied: 11/19/22 PASRR Number: [id number]  Discharge Plan: SNF    Current Diagnoses: Patient Active Problem List   Diagnosis Date Noted   Anxiety 08/16/2023   Hyponatremia 08/16/2023   Influenza A 08/16/2023   Acute respiratory failure with hypoxia (HCC) 08/15/2023   Protein-calorie malnutrition, severe 06/23/2023   Multiple rib fractures 06/22/2023   Rib fracture 06/21/2023   AKI (acute kidney injury) (HCC) 06/21/2023   Debility 06/21/2023   Intractable nausea and vomiting 11/18/2022   Nausea & vomiting 11/17/2022   Chronic cough 06/12/2020   Constipation    CAP (community acquired pneumonia) 02/09/2020   Acute on chronic respiratory failure with hypoxia (HCC) 07/06/2019   Fall at home, initial encounter 07/06/2019   Orthostatic hypotension 07/06/2019   Rib fractures 07/04/2019   Pneumonia due to COVID-19 virus 06/11/2019   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HCC) 05/07/2019   Acute asthma exacerbation 12/18/2018   Elevated troponin 12/18/2018   Acute bronchitis 08/30/2018   Chronic respiratory failure with hypoxia (HCC) 04/06/2018   Essential hypertension 03/24/2018   Depression 03/24/2018   Asthma exacerbation 03/23/2018   DOE (dyspnea on exertion) 03/21/2018   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 esophagitis 03/21/2018   Chronic lung disease    Hypoxia    Dyspnea 01/23/2018   ILD  (interstitial lung disease) (HCC) 01/23/2018   Claw toe, acquired, 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   Restrictive lung disease 07/18/2013   Multiple pulmonary nodules/RA lung dz  05/29/2013   Cough variant asthma vs uacs  05/29/2013   Rheumatoid arthritis (HCC) 01/13/2012   Osteopenia 01/13/2012   Lichen sclerosus et atrophicus of the vulva 01/13/2012   Asthma 01/13/2012    Orientation RESPIRATION BLADDER Height & Weight     Place, Situation, Time, Self  Normal Continent Weight: 131 lb (59.4 kg) Height:  5\' 6"  (167.6 cm)  BEHAVIORAL SYMPTOMS/MOOD NEUROLOGICAL BOWEL NUTRITION STATUS      Continent Diet (regular- heart)  AMBULATORY STATUS COMMUNICATION OF NEEDS Skin   Limited Assist Verbally Normal                       Personal Care Assistance Level of Assistance  Dressing, Bathing, Feeding Bathing Assistance: Limited assistance Feeding assistance: Limited assistance Dressing Assistance: Limited assistance     Functional Limitations Info  Speech, Hearing, Sight Sight Info: Adequate Hearing Info: Adequate Speech Info: Adequate    SPECIAL CARE FACTORS FREQUENCY  OT (By licensed OT), PT (By licensed PT)     PT Frequency: 5x/week OT Frequency: 5x/week            Contractures Contractures Info: Not present    Additional Factors Info  Code Status, Allergies Code Status Info: DNR- limited Allergies Info: Penicillins  Remicade (Infliximab)           Current Medications (08/16/2023):  This is the current hospital active medication list Current Facility-Administered Medications  Medication Dose Route Frequency Provider Last  Rate Last Admin   0.9 %  sodium chloride infusion   Intravenous Continuous Marinda Elk, MD 100 mL/hr at 08/16/23 1027 800 mL at 08/16/23 1027   acetaminophen (TYLENOL) tablet 650 mg  650 mg Oral Q6H PRN Eduard Clos, MD       Or   acetaminophen (TYLENOL) suppository 650 mg  650 mg Rectal Q6H PRN Eduard Clos, MD       amitriptyline  (ELAVIL) tablet 25 mg  25 mg Oral QHS Eduard Clos, MD       enoxaparin (LOVENOX) injection 40 mg  40 mg Subcutaneous Q24H Eduard Clos, MD   40 mg at 08/16/23 1024   feeding supplement (ENSURE ENLIVE / ENSURE PLUS) liquid 237 mL  237 mL Oral BID BM Eduard Clos, MD   237 mL at 08/16/23 1234   gabapentin (NEURONTIN) capsule 300 mg  300 mg Oral QHS Marinda Elk, MD       hydrALAZINE (APRESOLINE) tablet 10 mg  10 mg Oral Q6H PRN Marinda Elk, MD       losartan (COZAAR) tablet 25 mg  25 mg Oral Daily Eduard Clos, MD   25 mg at 08/16/23 1022   Nintedanib (Ofev) CAPS 100 mg  1 capsule Oral BID Eduard Clos, MD       ondansetron [facility]) tablet 4 mg  4 mg Oral Q6H PRN Eduard Clos, MD       Or   ondansetron [facility]) injection 4 mg  4 mg Intravenous Q6H PRN Eduard Clos, MD   4 mg at 08/16/23 1249   oseltamivir (TAMIFLU) capsule 30 mg  30 mg Oral BID Eduard Clos, MD   30 mg at 08/16/23 1022   pantoprazole (PROTONIX) EC tablet 40 mg  40 mg Oral Daily Eduard Clos, MD   40 mg at 08/16/23 1022   polyethylene glycol (MIRALAX / GLYCOLAX) packet 17 g  17 g Oral Daily PRN Eduard Clos, MD       polyethylene glycol (MIRALAX / GLYCOLAX) packet 17 g  17 g Oral BID Marinda Elk, MD       pravastatin (PRAVACHOL) tablet 20 mg  20 mg Oral Daily Eduard Clos, MD   20 mg at 08/16/23 1149   venlafaxine XR (EFFEXOR-XR) 24 hr capsule 75 mg  75 mg Oral QHS Eduard Clos, MD         Discharge Medications: Please see discharge summary for a list of discharge medications.  Relevant Imaging Results:  Relevant Lab Results:   Additional Information SSN [id number]  Diona Browner, LCSW

## 2023-08-16 NOTE — H&P (Signed)
History and Physical    Stacey [name]:[id number] DOB: 1940/09/11 DOA: 08/15/2023  Patient coming from: Home.  Chief Complaint: Nausea vomiting.  HPI: Stacey [name] is a 83 y.o. female with history of interstitial lung disease, GERD, rheumatoid arthritis, hyperlipidemia, hypertension presents to the ER because of having persistent nausea vomiting for the last 2 days.  Denies any recent sick contacts or travel.  Denies any fever chills and has not had any abdominal pain or diarrhea.  Has chronic cough. Patient states she felt weak and did have a fall last night while walking to the commode.  Did not hit her head but has some pain in the low back.  ED Course: In the ER patient was afebrile.  But was requiring intermittently oxygen.  Flu test came back positive for influenza A.  Chest x-ray shows chronic changes.  Labs show hyponatremia with acute renal failure.  Was started on fluids.  Admitted for acute renal failure with nausea vomiting and influenza A.  Started on Tamiflu for influenza A.  Review of Systems: As per HPI, rest all negative.   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 She has never used smokeless tobacco. She reports that she does not drink  alcohol and does not use drugs.  Allergies  Allergen Reactions   Penicillins Other (See Comments)    Unknown reaction  Did it involve swelling of the face/tongue/throat, SOB, or low BP? Unknown Did it involve sudden or severe rash/hives, skin peeling, or any reaction on the inside of your mouth or nose? Unknown Did you need to seek medical attention at a hospital or doctor's office? No When did it last happen?     childhood  If all above answers are "NO", may proceed with cephalosporin use.     Remicade [Infliximab] Other (See Comments)    Unknown reaction    Family History  Problem Relation Age of Onset   Asthma Mother    Anemia Mother    Polymyalgia rheumatica Mother    COPD Father    Pulmonary fibrosis Father    Breast cancer Maternal Grandmother 36    Prior to Admission medications   Medication Sig Start Date End Date Taking? Authorizing Provider  acetaminophen (TYLENOL) 500 MG tablet Take 1,000 mg by mouth every 6 (six) hours as needed for moderate pain (pain score 4-6).   Yes [provider]  amitriptyline (ELAVIL) 25 MG tablet Take 25 mg by mouth at bedtime. 02/04/15  Yes [provider]  BIOTIN PO Take 1 tablet by mouth daily.   Yes  [provider]  Cholecalciferol (VITAMIN D-3 PO) Take 1 capsule by mouth daily.   Yes [provider]  feeding supplement (ENSURE ENLIVE / ENSURE PLUS) LIQD Take 237 mLs by mouth 2 (two) times daily between meals. Patient taking differently: Take 237 mLs by mouth 2 (two) times daily as needed. 06/25/23  Yes Kathlen Mody, MD  gabapentin (NEURONTIN) 300 MG capsule Take 600 mg by mouth at bedtime.   Yes [provider]  losartan (COZAAR) 25 MG tablet Take 25 mg by mouth daily. 02/04/21  Yes [provider]  Nintedanib (OFEV) 100 MG CAPS Take 1 capsule (100 mg total) by mouth 2 (two) times daily. 12/02/22  Yes Pahwani, Daleen Bo, MD  ondansetron (ZOFRAN) 4 MG tablet TAKE ONE TABLET BY MOUTH EVERY 8  HOURS AS NEEDED FOR NAUSEA AND VOMITING Patient taking differently: Take 4 mg by mouth See admin instructions. Take 1 tablet (4mg ) every morning and may take up to 2 more tablets during the day (up to three tablets daily) if needed for nausea. 03/17/23  Yes Kalman Shan, MD  pantoprazole (PROTONIX) 40 MG tablet Take 40 mg by mouth daily.   Yes [provider]  polyethylene glycol (MIRALAX / GLYCOLAX) 17 g packet Take 17 g by mouth daily as needed for mild constipation. 06/25/23  Yes Kathlen Mody, MD  pravastatin (PRAVACHOL) 20 MG tablet Take 1 tablet (20 mg total) by mouth daily. 02/10/23  Yes Jodelle Red, MD  riTUXimab (RITUXAN IV) Inject 1,000 mg into the vein every 6 (six) months.   Yes [provider]  venlafaxine XR (EFFEXOR-XR) 75 MG 24 hr capsule Take 75 mg by mouth at bedtime.    Yes [provider]    Physical Exam: Constitutional: Moderately built and nourished. Vitals:   08/15/23 2014 08/15/23 2015 08/15/23 2030 08/15/23 2100  BP: (!) 148/71 (!) 144/64 (!) 162/68 (!) 150/137  Pulse: 73 74 62 (!) 48  Resp: 18     Temp: 99.5 F (37.5 C)     TempSrc: Oral     SpO2: 96% 99% 94% 100%  Weight:  59.4 kg    Height:  5\' 6"  (1.676 m)     Eyes: Anicteric no pallor. ENMT: No discharge from the ears eyes nose or mouth. Neck: No mass felt.  No neck rigidity. Respiratory: No rhonchi or crepitations. Cardiovascular: S1-S2 heard. Abdomen: Soft nontender bowel sound present. Musculoskeletal: Pain on moving her legs.  Pain is mostly in the hip area. Skin: Chronic skin changes. Neurologic: Alert awake oriented to time place and person.  Moves all extremities. Psychiatric: Appears normal.  Normal affect.   Labs on Admission: I have personally reviewed following labs and imaging studies  CBC: Recent Labs  Lab 08/15/23 2115  WBC 6.7  NEUTROABS 4.1  HGB 12.6  HCT 39.8  MCV 92.3  PLT 169   Basic Metabolic Panel: Recent Labs  Lab  08/15/23 2115  NA 128*  K 4.2  CL 92*  CO2 26  GLUCOSE 86  BUN 20  CREATININE 1.02*  CALCIUM 8.1*   GFR: Estimated Creatinine Clearance: 39.1 mL/min (A) (by C-G formula based on SCr of 1.02 mg/dL (H)). Liver Function Tests: Recent Labs  Lab 08/15/23 2115  AST 83*  ALT 28  ALKPHOS 78  BILITOT 0.7  PROT 6.5  ALBUMIN 3.6   No results for input(s): "LIPASE", "AMYLASE" in the last 168 hours. No results for input(s): "AMMONIA" in the last 168 hours. Coagulation Profile: No results for input(s): "  INR", "PROTIME" in the last 168 hours. Cardiac Enzymes: No results for input(s): "CKTOTAL", "CKMB", "CKMBINDEX", "TROPONINI" in the last 168 hours. BNP (last 3 results) No results for input(s): "PROBNP" in the last 8760 hours. HbA1C: No results for input(s): "HGBA1C" in the last 72 hours. CBG: No results for input(s): "GLUCAP" in the last 168 hours. Lipid Profile: No results for input(s): "CHOL", "HDL", "LDLCALC", "TRIG", "CHOLHDL", "LDLDIRECT" in the last 72 hours. Thyroid Function Tests: No results for input(s): "TSH", "T4TOTAL", "FREET4", "T3FREE", "THYROIDAB" in the last 72 hours. Anemia Panel: No results for input(s): "VITAMINB12", "FOLATE", "FERRITIN", "TIBC", "IRON", "RETICCTPCT" in the last 72 hours. Urine analysis:    Component Value Date/Time   COLORURINE YELLOW 10/08/2021 1958   APPEARANCEUR CLEAR 10/08/2021 1958   LABSPEC 1.010 10/08/2021 1958   PHURINE 7.0 10/08/2021 1958   GLUCOSEU NEGATIVE 10/08/2021 1958   HGBUR NEGATIVE 10/08/2021 1958   BILIRUBINUR NEGATIVE 10/08/2021 1958   KETONESUR NEGATIVE 10/08/2021 1958   PROTEINUR NEGATIVE 10/08/2021 1958   UROBILINOGEN 0.2 12/18/2009 0120   NITRITE NEGATIVE 10/08/2021 1958   LEUKOCYTESUR TRACE (A) 10/08/2021 1958   Sepsis Labs: @LABRCNTIP (procalcitonin:4,lacticidven:4) ) Recent Results (from the past 240 hours)  Resp panel by RT-PCR (RSV, Flu A&B, Covid) Anterior Nasal Swab     Status: Abnormal   Collection  Time: 08/15/23  9:15 PM   Specimen: Anterior Nasal Swab  Result Value Ref Range Status   SARS Coronavirus 2 by RT PCR NEGATIVE NEGATIVE Final    Comment: (NOTE) SARS-CoV-2 target nucleic acids are NOT DETECTED.  The SARS-CoV-2 RNA is generally detectable in upper respiratory specimens during the acute phase of infection. The lowest concentration of SARS-CoV-2 viral copies this assay can detect is 138 copies/mL. A negative result does not preclude SARS-Cov-2 infection and should not be used as the sole basis for treatment or other patient management decisions. A negative result may occur with  improper specimen collection/handling, submission of specimen other than nasopharyngeal swab, presence of viral mutation(s) within the areas targeted by this assay, and inadequate number of viral copies(<138 copies/mL). A negative result must be combined with clinical observations, patient history, and epidemiological information. The expected result is Negative.  Fact Sheet for Patients:  BloggerCourse.com  Fact Sheet for Healthcare Providers:  SeriousBroker.it  This test is no t yet approved or cleared by the [place] FDA and  has been authorized for detection and/or diagnosis of SARS-CoV-2 by FDA under an Emergency Use Authorization (EUA). This EUA will remain  in effect (meaning this test can be used) for the duration of the COVID-19 declaration under Section 564(b)(1) of the Act, 21 U.S.C.section 360bbb-3(b)(1), unless the authorization is terminated  or revoked sooner.       Influenza A by PCR POSITIVE (A) NEGATIVE Final   Influenza B by PCR NEGATIVE NEGATIVE Final    Comment: (NOTE) The Xpert Xpress SARS-CoV-2/FLU/RSV plus assay is intended as an aid in the diagnosis of influenza from Nasopharyngeal swab specimens and should not be used as a sole basis for treatment. Nasal washings and aspirates are unacceptable for Xpert  Xpress SARS-CoV-2/FLU/RSV testing.  Fact Sheet for Patients: BloggerCourse.com  Fact Sheet for Healthcare Providers: SeriousBroker.it  This test is not yet approved or cleared by the [place] FDA and has been authorized for detection and/or diagnosis of SARS-CoV-2 by FDA under an Emergency Use Authorization (EUA). This EUA will remain in effect (meaning this test can be used) for the duration of the COVID-19 declaration under Section 564(b)(1) of  the Act, 21 U.S.C. section 360bbb-3(b)(1), unless the authorization is terminated or revoked.     Resp Syncytial Virus by PCR NEGATIVE NEGATIVE Final    Comment: (NOTE) Fact Sheet for Patients: BloggerCourse.com  Fact Sheet for Healthcare Providers: SeriousBroker.it  This test is not yet approved or cleared by the [place] FDA and has been authorized for detection and/or diagnosis of SARS-CoV-2 by FDA under an Emergency Use Authorization (EUA). This EUA will remain in effect (meaning this test can be used) for the duration of the COVID-19 declaration under Section 564(b)(1) of the Act, 21 U.S.C. section 360bbb-3(b)(1), unless the authorization is terminated or revoked.  Performed at [facility], [address] [place], [place] [address]      Radiological Exams on Admission: DG Chest Port 1 View Result Date: 08/15/2023 CLINICAL DATA:  Shortness of breath. Cough, congestion, and fatigue. Negative COVID test. Wheezing. Decreased oxygenation. EXAM: PORTABLE CHEST 1 VIEW COMPARISON:  06/24/2023 FINDINGS: Heart size and pulmonary vascularity are normal. Bronchiectasis and interstitial changes in the lungs likely representing chronic bronchitis and fibrosis. Similar appearance to previous studies. No airspace disease or consolidation. No pleural effusion or pneumothorax. Mediastinal contours appear intact. Old  right rib fractures. Surgical clips in the right upper quadrant. Calcification of the aorta. IMPRESSION: Chronic bronchitic changes in the lungs. 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 No focal consolidation. Electronically Signed   By: Burman Nieves M.D.   On: 08/15/2023 21:41    Assessment/Plan Principal Problem:   Nausea & vomiting Active Problems:   Rheumatoid arthritis (HCC)   Restrictive lung disease   ILD (interstitial lung disease) (HCC)   Fall at home, initial encounter   AKI (acute kidney injury) (HCC)   Acute respiratory failure with hypoxia (HCC)    Intractable nausea vomiting could be from viral gastroenteritis or influenza A infection.  Advance diet as tolerated.  KUB is pending.  Abdomen appears benign. Influenza A positive with patient requiring intermittently oxygen with acute respiratory failure with hypoxia on 2 L will keep patient on Tamiflu given the interstitial lung disease.  2D echo done in 2022 showed EF of 65 to 70% with grade 1 diastolic dysfunction. Acute renal failure with hyponatremia likely from nausea vomiting.  Creatinine in December 2024 was 0.8.  It is around 1.02 now.  Gentle hydration.  Follow metabolic panel. Fall with low back pain x-ray pelvis pending. Hypertension on ARB which may need to be held if creatinine does not improve with IV fluids. Hyperlipidemia on statins.  Interstitial lung disease on Ofev. Rheumatoid arthritis on Rituxan infusion every 6 months. Neuropathic pain on gabapentin and amitriptyline.  I decreased Neurontin dose from 900 mg to 600 mg for now due to worsening renal function. Anxiety on Effexor. GERD on PPI.  Since patient has intractable nausea vomiting with acute renal failure with hypoxia and influenza A will need more than 2 midnight stay.   DVT prophylaxis: Lovenox. Code Status: DNR as confirmed with patient. Family Communication: Discussed with patient. Disposition Plan: Medical floor. Consults called: Physical  therapy. Admission status: Observation.

## 2023-08-16 NOTE — Progress Notes (Signed)
PHARMACY NOTE:  ANTIMICROBIAL RENAL DOSAGE ADJUSTMENT  Current antimicrobial regimen includes a mismatch between antimicrobial dosage and estimated renal function.  As per policy approved by the Pharmacy & Therapeutics and Medical Executive Committees, the antimicrobial dosage will be adjusted accordingly.  Current antimicrobial dosage:  Tamiflu 75mg  PO BID  Indication: +Influenza A  Renal Function:  Estimated Creatinine Clearance: 39.1 mL/min (A) (by C-G formula based on SCr of 1.02 mg/dL (H)). []      On intermittent HD, scheduled: []      On CRRT    Antimicrobial dosage has been changed to:   Tamiflu 75mg  PO x1 then 30mg  PO BID  Additional comments:   Thank you for allowing pharmacy to be a part of this patient's care.  Junita Push, [facility] 08/16/2023 12:27 AM

## 2023-08-16 NOTE — Evaluation (Signed)
Physical Therapy Evaluation Patient Details Name: Stacey [name] MRN: [id number] DOB: 11-12-1940 Today's Date: 08/16/2023  History of Present Illness  Stacey [name] is an 83 y.o. female presnets to ER 08/15/23  with several days of nausea ans vomiting. Positive for influenza A, mildly hyponatremic and AKI.Marland Kitchen PMH: interstitial lung disease, GERD rheumatoid arthritis essential hypertension  Clinical Impression  Pt admitted with above diagnosis.  Pt currently with functional limitations due to the deficits listed below (see PT Problem List). Pt will benefit from acute skilled PT to increase their independence and safety with mobility to allow discharge.     The patient reports feeling very weak, reports near passing out with nursing during transfer earlier. Patient began to mobilize then  reported that she was too fatigued. Patient resides at ILF and uses a  rollator PRN.  Continue PT for mobil;ity to determine disposition, once up and mobile.  Patient will benefit from continued inpatient follow up therapy, <3 hours/day       If plan is discharge home, recommend the following: A little help with walking and/or transfers;A little help with bathing/dressing/bathroom;Assist for transportation;Help with stairs or ramp for entrance;Assistance with cooking/housework   Can travel by private vehicle   Yes    Equipment Recommendations None recommended by PT  Recommendations for Other Services       Functional Status Assessment Patient has had a recent decline in their functional status and demonstrates the ability to make significant improvements in function in a reasonable and predictable amount of time.     Precautions / Restrictions Precautions Precautions: Fall      Mobility  Bed Mobility               General bed mobility comments: patient began to mobilize to bed edge, began to feel bad and reported that she was too fatigued and could not proceed. Reports that she  had a  near passing out when assisted to [facility].    Transfers                   General transfer comment: NT    Ambulation/Gait                  Stairs            Wheelchair Mobility     Tilt Bed    Modified Rankin (Stroke Patients Only)       Balance                                             Pertinent Vitals/Pain Pain Assessment Pain Assessment: Faces Faces Pain Scale: Hurts even more Pain Location: generalized Pain Descriptors / Indicators: Aching    Home Living Family/patient expects to be discharged to:: Private residence Living Arrangements: Alone Available Help at Discharge: Family;Available PRN/intermittently Type of Home: Independent living facility Home Access: Level entry         Home Equipment: Rollator (4 wheels);Cane - quad;Shower seat;Other (comment)      Prior Function Prior Level of Function : Independent/Modified Independent             Mobility Comments: ambulates with rollator. Reports multiple falls ADLs Comments: independent with ADLs, has not been driving due to vision     Extremity/Trunk Assessment   Upper Extremity Assessment Upper Extremity Assessment: Generalized weakness    Lower Extremity Assessment  Lower Extremity Assessment: Generalized weakness    Cervical / Trunk Assessment Cervical / Trunk Assessment: Kyphotic  Communication   Communication Communication: Difficulty communicating thoughts/reduced clarity of speech  Cognition Arousal: Alert Behavior During Therapy: WFL for tasks assessed/performed Overall Cognitive Status: Within Functional Limits for tasks assessed                                          General Comments      Exercises     Assessment/Plan    PT Assessment Patient needs continued PT services  PT Problem List Decreased strength;Decreased mobility;Decreased activity tolerance       PT Treatment Interventions DME  instruction;Therapeutic activities;Gait training;Therapeutic exercise;Patient/family education;Functional mobility training    PT Goals (Current goals can be found in the Care Plan section)  Acute Rehab PT Goals Patient Stated Goal: to return to ILF PT Goal Formulation: With patient/family Time For Goal Achievement: 08/30/23 Potential to Achieve Goals: Good    Frequency Min 1X/week     Co-evaluation               AM-PAC PT "6 Clicks" Mobility  Outcome Measure Help needed turning from your back to your side while in a flat bed without using bedrails?: A Little Help needed moving from lying on your back to sitting on the side of a flat bed without using bedrails?: A Little Help needed moving to and from a bed to a chair (including a wheelchair)?: A Lot Help needed standing up from a chair using your arms (e.g., wheelchair or bedside chair)?: A Lot Help needed to walk in hospital room?: Total Help needed climbing 3-5 steps with a railing? : Total 6 Click Score: 12    End of Session   Activity Tolerance: Patient limited by fatigue;Treatment limited secondary to medical complications (Comment) Patient left: in bed;with call bell/phone within reach;with family/visitor present Nurse Communication: Mobility status PT Visit Diagnosis: Unsteadiness on feet (R26.81);Muscle weakness (generalized) (M62.81);Difficulty in walking, not elsewhere classified (R26.2)    Time: 0912-0923 PT Time Calculation (min) (ACUTE ONLY): 11 min   Charges:   PT Evaluation $PT Eval Low Complexity: 1 Low   PT General Charges $$ ACUTE PT VISIT: 1 Visit         Blanchard Kelch PT Acute Rehabilitation Services Office [phone] Weekend pager-[phone]   Rada Hay 08/16/2023, 12:49 PM

## 2023-08-16 NOTE — ED Notes (Signed)
Tried pt on room air and her sats dropped to the mid 80's to 90

## 2023-08-16 NOTE — Progress Notes (Signed)
[facility] PROGRESS NOTE    Progress Note  Stacey [name]  GMW:[id number] DOB: December 18, 1940 DOA: 08/15/2023 PCP: Eloisa Northern, MD     Brief Narrative:   Stacey [name] is an 83 y.o. female past medical history of interstitial lung disease, GERD rheumatoid arthritis essential hypertension comes to the ER for persistent nausea and vomiting that has been going on for 2 days prior to admission denies any abdominal pain or diarrhea.  She relates she feels weak and has fallen but no trauma.  Influenza a PCR was positive she is slightly hyponatremic and acute kidney injury   Assessment/Plan:   Intractable Nausea & vomiting likely due to influenza A: Is requiring 2 L of oxygen to keep saturations greater than 90%.  She was started on Tamiflu. KUB moderate stool burden, pelvic x-ray showed no acute fractures. Chest x-ray showed chronic interstitial changes. Currently on a diet advance as tolerated. She relates her nausea is improved allow oral hydration.  Acute kidney injury: With a baseline creatinine of less than 1 on admission 1.0. Likely hemodynamically mediated.  Allow oral hydration. Recheck basic metabolic panel in the morning, her urine appears clear.  Essential hypertension: Blood pressure significantly elevated. Continue ARB, started  hydralazine as needed. Continue monitor blood pressure.  Hypovolemic hyponatremia: Continue IV fluids recheck in the morning.  Hyperlipidemia: Continue statins.  Interstitial lung disease: Cont Ofev.  Rheumatoid arthritis (HCC) Continue Rituxan.  Neuropathic pain: Continue gabapentin at a lower dose due to renal failure.  Anxiety: Continue Effexor.   DVT prophylaxis: lovenox Family Communication:none Status is: Observation The patient will require care spanning > 2 midnights and should be moved to inpatient because: Intractable nausea and vomiting and acute kidney injury    Code Status:     Code Status Orders   (From admission, onward)           Start     Ordered   08/16/23 0020  Do not attempt resuscitation (DNR)- Limited -Do Not Intubate (DNI)  Continuous       Question Answer Comment  If pulseless and not breathing No CPR or chest compressions.   In Pre-Arrest Conditions (Patient Is Breathing and Has A Pulse) Do not intubate. Provide all appropriate non-invasive medical interventions. Avoid ICU transfer unless indicated or required.   Consent: Discussion documented in EHR or advanced directives reviewed      08/16/23 0020           Code Status History     Date Active Date Inactive Code Status Order ID Comments User Context   06/21/2023 2116 06/25/2023 1957 Full Code [id number]  Therisa Doyne, MD ED   11/17/2022 2212 11/20/2022 2012 Full Code [id number]  Rometta Emery, MD ED   11/17/2022 1951 11/17/2022 2212 Full Code [id number]  Rometta Emery, MD ED   02/09/2020 2204 02/15/2020 2140 Full Code [id number]  John Giovanni, MD ED   07/04/2019 0213 07/06/2019 1708 Full Code [id number]  Pearson Grippe, MD ED   06/12/2019 0331 06/16/2019 1714 Full Code [id number]  Therisa Doyne, MD ED   05/06/2019 2229 05/10/2019 1654 Full Code [id number]  Jacques Navy, MD ED   12/18/2018 0222 12/24/2018 1819 Full Code [id number]  John Giovanni, MD ED   03/23/2018 1036 03/29/2018 1701 Full Code [id number]  Rhetta Mura, MD ED   01/23/2018 0232 01/27/2018 2001 Full Code [id number]  Pearson Grippe, MD ED      Advance Directive Documentation    Flowsheet Row Most Recent Value  Type  of Public librarian Power of Attorney, Living will  Pre-existing out of facility DNR order (yellow form or pink MOST form) --  "MOST" Form in Place? --         IV Access:   Peripheral IV   Procedures and diagnostic studies:   DG Abd 1 View Result Date: 08/16/2023 CLINICAL DATA:  Nausea, vomiting EXAM: ABDOMEN - 1 VIEW COMPARISON:  None Available. FINDINGS: Normal abdominal gas pattern. Moderate  stool burden. Cholecystectomy clips are seen in the right upper quadrant. Underpenetration precludes evaluation of the visceral shadows. Vascular calcifications are noted. Osseous structures are age-appropriate with degenerative changes noted at the lumbosacral junction and asymmetrically within the left hip. IMPRESSION: 1. Moderate stool burden. No obstruction. Electronically Signed   By: Helyn Numbers M.D.   On: 08/16/2023 03:11   DG Pelvis 1-2 Views Result Date: 08/16/2023 CLINICAL DATA:  Fall. EXAM: PELVIS - 1-2 VIEW COMPARISON:  10/08/2021 FINDINGS: Degenerative changes in the lower lumbar spine and both hips. Pelvis and hips appear intact. No evidence of acute fracture or dislocation. No focal bone lesion or bone destruction. SI joints and symphysis pubis are not displaced. IMPRESSION: Degenerative changes in the hips.  No acute bony abnormalities. Electronically Signed   By: Burman Nieves M.D.   On: 08/16/2023 01:41   DG Chest Port 1 View Result Date: 08/15/2023 CLINICAL DATA:  Shortness of breath. Cough, congestion, and fatigue. Negative COVID test. Wheezing. Decreased oxygenation. EXAM: PORTABLE CHEST 1 VIEW COMPARISON:  06/24/2023 FINDINGS: Heart size and pulmonary vascularity are normal. Bronchiectasis and interstitial changes in the lungs likely representing chronic bronchitis and fibrosis. Similar appearance to previous studies. No airspace disease or consolidation. No pleural effusion or pneumothorax. Mediastinal contours appear intact. Old right rib fractures. Surgical clips in the right upper quadrant. Calcification of the aorta. IMPRESSION: Chronic bronchitic changes in the lungs. 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 No focal consolidation. Electronically Signed   By: Burman Nieves M.D.   On: 08/15/2023 21:41     Medical Consultants:   None.   Subjective:    Stacey [name] relates she still feels tired and fatigue  Objective:    Vitals:   08/15/23 2100 08/16/23 0303  08/16/23 0430 08/16/23 0530  BP: (!) 150/137 (!) 106/90 (!) 153/67 (!) 193/69  Pulse: (!) 48 70 65 68  Resp:  19 20 19   Temp:  99.1 F (37.3 C)    TempSrc:  Oral    SpO2: 100% 99% 99% 99%  Weight:      Height:       SpO2: 99 % O2 Flow Rate (L/min): 2 L/min   Intake/Output Summary (Last 24 hours) at 08/16/2023 0654 Last data filed at 08/15/2023 2248 Gross per 24 hour  Intake 500 ml  Output --  Net 500 ml   Filed Weights   08/15/23 2015  Weight: 59.4 kg    Exam: General exam: In no acute distress. Respiratory system: Good air movement and clear to auscultation. Cardiovascular system: S1 & S2 heard, RRR. No JVD. Gastrointestinal system: Abdomen is nondistended, soft and nontender.  Extremities: No pedal edema. Skin: No rashes, lesions or ulcers Psychiatry: Judgement and insight appear normal. Mood & affect appropriate.    Data Reviewed:    Labs: Basic Metabolic Panel: Recent Labs  Lab 08/15/23 2115 08/16/23 0020  NA 128*  --   K 4.2  --   CL 92*  --   CO2 26  --   GLUCOSE 86  --  BUN 20  --   CREATININE 1.02* 0.71  CALCIUM 8.1*  --    GFR Estimated Creatinine Clearance: 49.9 mL/min (by C-G formula based on SCr of 0.71 mg/dL). Liver Function Tests: Recent Labs  Lab 08/15/23 2115  AST 83*  ALT 28  ALKPHOS 78  BILITOT 0.7  PROT 6.5  ALBUMIN 3.6   No results for input(s): "LIPASE", "AMYLASE" in the last 168 hours. No results for input(s): "AMMONIA" in the last 168 hours. Coagulation profile No results for input(s): "INR", "PROTIME" in the last 168 hours. COVID-19 Labs  No results for input(s): "DDIMER", "FERRITIN", "LDH", "CRP" in the last 72 hours.  Lab Results  Component Value Date   SARSCOV2NAA NEGATIVE 08/15/2023   SARSCOV2NAA NEGATIVE 12/20/2020   SARSCOV2NAA NEGATIVE 10/18/2020   SARSCOV2NAA NEGATIVE 09/06/2020    CBC: Recent Labs  Lab 08/15/23 2115 08/16/23 0020  WBC 6.7 6.0  NEUTROABS 4.1  --   HGB 12.6 11.1*  HCT 39.8 34.5*   MCV 92.3 91.8  PLT 169 152   Cardiac Enzymes: No results for input(s): "CKTOTAL", "CKMB", "CKMBINDEX", "TROPONINI" in the last 168 hours. BNP (last 3 results) No results for input(s): "PROBNP" in the last 8760 hours. CBG: No results for input(s): "GLUCAP" in the last 168 hours. D-Dimer: No results for input(s): "DDIMER" in the last 72 hours. Hgb A1c: No results for input(s): "HGBA1C" in the last 72 hours. Lipid Profile: No results for input(s): "CHOL", "HDL", "LDLCALC", "TRIG", "CHOLHDL", "LDLDIRECT" in the last 72 hours. Thyroid function studies: No results for input(s): "TSH", "T4TOTAL", "T3FREE", "THYROIDAB" in the last 72 hours.  Invalid input(s): "FREET3" Anemia work up: No results for input(s): "VITAMINB12", "FOLATE", "FERRITIN", "TIBC", "IRON", "RETICCTPCT" in the last 72 hours. Sepsis Labs: Recent Labs  Lab 08/15/23 2115 08/15/23 2337 08/16/23 0020  WBC 6.7  --  6.0  LATICACIDVEN  --  0.8  --    Microbiology Recent Results (from the past 240 hours)  Resp panel by RT-PCR (RSV, Flu A&B, Covid) Anterior Nasal Swab     Status: Abnormal   Collection Time: 08/15/23  9:15 PM   Specimen: Anterior Nasal Swab  Result Value Ref Range Status   SARS Coronavirus 2 by RT PCR NEGATIVE NEGATIVE Final    Comment: (NOTE) SARS-CoV-2 target nucleic acids are NOT DETECTED.  The SARS-CoV-2 RNA is generally detectable in upper respiratory specimens during the acute phase of infection. The lowest concentration of SARS-CoV-2 viral copies this assay can detect is 138 copies/mL. A negative result does not preclude SARS-Cov-2 infection and should not be used as the sole basis for treatment or other patient management decisions. A negative result may occur with  improper specimen collection/handling, submission of specimen other than nasopharyngeal swab, presence of viral mutation(s) within the areas targeted by this assay, and inadequate number of viral copies(<138 copies/mL). A  negative result must be combined with clinical observations, patient history, and epidemiological information. The expected result is Negative.  Fact Sheet for Patients:  BloggerCourse.com  Fact Sheet for Healthcare Providers:  SeriousBroker.it  This test is no t yet approved or cleared by the [place] FDA and  has been authorized for detection and/or diagnosis of SARS-CoV-2 by FDA under an Emergency Use Authorization (EUA). This EUA will remain  in effect (meaning this test can be used) for the duration of the COVID-19 declaration under Section 564(b)(1) of the Act, 21 U.S.C.section 360bbb-3(b)(1), unless the authorization is terminated  or revoked sooner.       Influenza  A by PCR POSITIVE (A) NEGATIVE Final   Influenza B by PCR NEGATIVE NEGATIVE Final    Comment: (NOTE) The Xpert Xpress SARS-CoV-2/FLU/RSV plus assay is intended as an aid in the diagnosis of influenza from Nasopharyngeal swab specimens and should not be used as a sole basis for treatment. Nasal washings and aspirates are unacceptable for Xpert Xpress SARS-CoV-2/FLU/RSV testing.  Fact Sheet for Patients: BloggerCourse.com  Fact Sheet for Healthcare Providers: SeriousBroker.it  This test is not yet approved or cleared by the [place] FDA and has been authorized for detection and/or diagnosis of SARS-CoV-2 by FDA under an Emergency Use Authorization (EUA). This EUA will remain in effect (meaning this test can be used) for the duration of the COVID-19 declaration under Section 564(b)(1) of the Act, 21 U.S.C. section 360bbb-3(b)(1), unless the authorization is terminated or revoked.     Resp Syncytial Virus by PCR NEGATIVE NEGATIVE Final    Comment: (NOTE) Fact Sheet for Patients: BloggerCourse.com  Fact Sheet for Healthcare  Providers: SeriousBroker.it  This test is not yet approved or cleared by the [place] FDA and has been authorized for detection and/or diagnosis of SARS-CoV-2 by FDA under an Emergency Use Authorization (EUA). This EUA will remain in effect (meaning this test can be used) for the duration of the COVID-19 declaration under Section 564(b)(1) of the Act, 21 U.S.C. section 360bbb-3(b)(1), unless the authorization is terminated or revoked.  Performed at [facility], [address] [place], [place] [address]   Culture, blood (routine x 2)     Status: None (Preliminary result)   Collection Time: 08/15/23  9:15 PM   Specimen: BLOOD  Result Value Ref Range Status   Specimen Description   Final    BLOOD RIGHT ARM Performed at [facility], [address], [place], [place] [address]    Special Requests   Final    BOTTLES DRAWN AEROBIC AND ANAEROBIC Blood Culture results may not be optimal due to an inadequate volume of blood received in culture bottles Performed at [facility], [address], [place], [place] [address]    Culture   Final    NO GROWTH < 12 HOURS Performed at [facility], [address], [place], [place] [address]    Report Status PENDING  Incomplete  Culture, blood (routine x 2)     Status: None (Preliminary result)   Collection Time: 08/15/23  9:15 PM   Specimen: BLOOD  Result Value Ref Range Status   Specimen Description   Final    BLOOD LEFT ARM Performed at [facility], [address], [place], [place] [address]    Special Requests   Final    BOTTLES DRAWN AEROBIC AND ANAEROBIC Blood Culture adequate volume Performed at [facility], [address], [place], [place] [address]    Culture   Final    NO GROWTH < 12 HOURS Performed at [facility], [address], [place], [place] [address]    Report Status PENDING  Incomplete     Medications:     amitriptyline  25 mg Oral QHS   enoxaparin (LOVENOX) injection  40 mg Subcutaneous Q24H   feeding supplement  237 mL Oral BID BM   gabapentin  600 mg Oral QHS   losartan  25 mg Oral Daily   Nintedanib  1 capsule Oral BID   oseltamivir  30 mg Oral BID   pantoprazole  40 mg Oral Daily  pravastatin  20 mg Oral Daily   venlafaxine XR  75 mg Oral QHS   Continuous Infusions:    LOS: 0 days   Marinda Elk  [facility]  08/16/2023, 6:54 AM

## 2023-08-16 NOTE — Plan of Care (Signed)
   Problem: Education: Goal: Knowledge of General Education information will improve Description Including pain rating scale, medication(s)/side effects and non-pharmacologic comfort measures Outcome: Progressing   Problem: Activity: Goal: Risk for activity intolerance will decrease Outcome: Progressing   Problem: Coping: Goal: Level of anxiety will decrease Outcome: Progressing   Problem: Safety: Goal: Ability to remain free from injury will improve Outcome: Progressing   Problem: Skin Integrity: Goal: Risk for impaired skin integrity will decrease Outcome: Progressing

## 2023-08-16 NOTE — TOC Initial Note (Addendum)
Transition of Care [facility]) - Initial/Assessment Note    Patient Details  Name: Stacey [name] MRN: [id number] Date of Birth: 01-19-1941  [facility]) CM/SW Contact:    Stacey Browner, LCSW Phone Number: 08/16/2023, 2:39 PM  Clinical Narrative:                 Pt recommended for SNF. Pt from [facility] ILF and wishes to go to [facility] SNF. Confirmed that bed is available for pt at [facility]. CSW to start SNF workup.  3:40pm- FL2 completed and sent to [facility]  Expected Discharge Plan: Skilled Nursing Facility Barriers to Discharge: No Barriers Identified   Patient Goals and CMS Choice Patient states their goals for this hospitalization and ongoing recovery are:: return home CMS Medicare.gov Compare Post Acute Care list provided to:: Patient Choice offered to / list presented to : Patient [place] ownership interest in [facility].provided to::  (NA)    Expected Discharge Plan and Services In-house Referral: Clinical Social Work Discharge Planning Services: NA Post Acute Care Choice: Skilled Nursing Facility Living arrangements for the past 2 months: Independent Living Facility                   DME Agency: NA                  Prior Living Arrangements/Services Living arrangements for the past 2 months: Independent Living Facility Lives with:: Self Patient language and need for interpreter reviewed:: Yes Do you feel safe going back to the place where you live?: Yes      Need for Family Participation in Patient Care: No (Comment) Care giver support system in place?: Yes (comment) Current home services: Other (comment) (NA) Criminal Activity/Legal Involvement Pertinent to Current Situation/Hospitalization: No - Comment as needed  Activities of Daily Living      Permission Sought/Granted                  Emotional Assessment Appearance:: Appears stated age Attitude/Demeanor/Rapport: Engaged Affect (typically observed):  Accepting Orientation: : Oriented to Self, Oriented to Place, Oriented to  Time, Oriented to Situation Alcohol / Substance Use: Never Used Psych Involvement: No (comment)  Admission diagnosis:  Hyponatremia [E87.1] Weakness [R53.1] Nausea & vomiting [R11.2] Hypoxia [R09.02] Acute respiratory failure with hypoxia (HCC) [J96.01] AKI (acute kidney injury) (HCC) [N17.9] Influenza [J11.1] Patient Active Problem List   Diagnosis Date Noted   Anxiety 08/16/2023   Hyponatremia 08/16/2023   Influenza A 08/16/2023   Acute respiratory failure with hypoxia (HCC) 08/15/2023   Protein-calorie malnutrition, severe 06/23/2023   Multiple rib fractures 06/22/2023   Rib fracture 06/21/2023   AKI (acute kidney injury) (HCC) 06/21/2023   Debility 06/21/2023   Intractable nausea and vomiting 11/18/2022   Nausea & vomiting 11/17/2022   Chronic cough 06/12/2020   Constipation    CAP (community acquired pneumonia) 02/09/2020   Acute on chronic respiratory failure with hypoxia (HCC) 07/06/2019   Fall at home, initial encounter 07/06/2019   Orthostatic hypotension 07/06/2019   Rib fractures 07/04/2019   Pneumonia due to COVID-19 virus 06/11/2019   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HCC) 05/07/2019   Acute asthma exacerbation 12/18/2018   Elevated troponin 12/18/2018   Acute bronchitis 08/30/2018   Chronic respiratory failure with hypoxia (HCC) 04/06/2018   Essential hypertension 03/24/2018   Depression 03/24/2018   Asthma exacerbation 03/23/2018   DOE (dyspnea on exertion) 03/21/2018   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 esophagitis 03/21/2018   Chronic lung disease    Hypoxia  Dyspnea 01/23/2018   ILD (interstitial lung disease) (HCC) 01/23/2018   Claw toe, acquired, 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 [place] [address] Phone: [phone] Fax: [phone]  [facility] - [name], [place] - [address] [facility] [address] [place] [address] Phone: [phone] Fax: [phone]  [facility] - [place], [place] - [phone] [address] [place] [address] Phone: [phone] Fax: [phone]  [facility] - [address] [place], [place] - [id number] [place] [address] Phone: [phone] Fax: [phone]     Social Drivers of Health (SDOH) Social History: SDOH Screenings   Food Insecurity: No Food Insecurity (06/21/2023)  Housing: Unknown (07/23/2023)   Received from [facility]  Transportation Needs: No Transportation Needs (06/21/2023)  Utilities: Not At Risk (06/21/2023)  Depression (PHQ2-9): Low Risk  (10/24/2020)  Tobacco Use: Low Risk  (08/16/2023)   SDOH Interventions:     Readmission Risk Interventions    08/16/2023    2:36 PM  Readmission Risk Prevention Plan  Transportation Screening Complete  PCP or Specialist Appt within 5-7 Days Complete  Home Care Screening Complete  Medication Review (RN CM) Complete

## 2023-08-17 ENCOUNTER — Encounter (HOSPITAL_COMMUNITY): Payer: Self-pay | Admitting: Internal Medicine

## 2023-08-17 DIAGNOSIS — R112 Nausea with vomiting, unspecified: Secondary | ICD-10-CM | POA: Diagnosis not present

## 2023-08-17 DIAGNOSIS — R509 Fever, unspecified: Secondary | ICD-10-CM | POA: Diagnosis not present

## 2023-08-17 DIAGNOSIS — Z803 Family history of malignant neoplasm of breast: Secondary | ICD-10-CM | POA: Diagnosis not present

## 2023-08-17 DIAGNOSIS — Z825 Family history of asthma and other chronic lower respiratory diseases: Secondary | ICD-10-CM | POA: Diagnosis not present

## 2023-08-17 DIAGNOSIS — I2510 Atherosclerotic heart disease of native coronary artery without angina pectoris: Secondary | ICD-10-CM | POA: Diagnosis not present

## 2023-08-17 DIAGNOSIS — L03115 Cellulitis of right lower limb: Secondary | ICD-10-CM | POA: Diagnosis not present

## 2023-08-17 DIAGNOSIS — K219 Gastro-esophageal reflux disease without esophagitis: Secondary | ICD-10-CM | POA: Diagnosis not present

## 2023-08-17 DIAGNOSIS — M810 Age-related osteoporosis without current pathological fracture: Secondary | ICD-10-CM | POA: Diagnosis not present

## 2023-08-17 DIAGNOSIS — N179 Acute kidney failure, unspecified: Secondary | ICD-10-CM | POA: Diagnosis not present

## 2023-08-17 DIAGNOSIS — R0989 Other specified symptoms and signs involving the circulatory and respiratory systems: Secondary | ICD-10-CM | POA: Diagnosis not present

## 2023-08-17 DIAGNOSIS — L03119 Cellulitis of unspecified part of limb: Secondary | ICD-10-CM | POA: Diagnosis not present

## 2023-08-17 DIAGNOSIS — R600 Localized edema: Secondary | ICD-10-CM | POA: Diagnosis not present

## 2023-08-17 DIAGNOSIS — F329 Major depressive disorder, single episode, unspecified: Secondary | ICD-10-CM | POA: Diagnosis not present

## 2023-08-17 DIAGNOSIS — R918 Other nonspecific abnormal finding of lung field: Secondary | ICD-10-CM | POA: Diagnosis not present

## 2023-08-17 DIAGNOSIS — J4489 Other specified chronic obstructive pulmonary disease: Secondary | ICD-10-CM | POA: Diagnosis not present

## 2023-08-17 DIAGNOSIS — M069 Rheumatoid arthritis, unspecified: Secondary | ICD-10-CM | POA: Diagnosis not present

## 2023-08-17 DIAGNOSIS — R531 Weakness: Secondary | ICD-10-CM

## 2023-08-17 DIAGNOSIS — J00 Acute nasopharyngitis [common cold]: Secondary | ICD-10-CM | POA: Diagnosis not present

## 2023-08-17 DIAGNOSIS — R062 Wheezing: Secondary | ICD-10-CM | POA: Diagnosis not present

## 2023-08-17 DIAGNOSIS — Z66 Do not resuscitate: Secondary | ICD-10-CM | POA: Diagnosis not present

## 2023-08-17 DIAGNOSIS — I5021 Acute systolic (congestive) heart failure: Secondary | ICD-10-CM | POA: Diagnosis not present

## 2023-08-17 DIAGNOSIS — R2689 Other abnormalities of gait and mobility: Secondary | ICD-10-CM | POA: Diagnosis not present

## 2023-08-17 DIAGNOSIS — R0609 Other forms of dyspnea: Secondary | ICD-10-CM | POA: Diagnosis not present

## 2023-08-17 DIAGNOSIS — R0902 Hypoxemia: Secondary | ICD-10-CM | POA: Diagnosis not present

## 2023-08-17 DIAGNOSIS — K449 Diaphragmatic hernia without obstruction or gangrene: Secondary | ICD-10-CM | POA: Diagnosis not present

## 2023-08-17 DIAGNOSIS — Z1152 Encounter for screening for COVID-19: Secondary | ICD-10-CM | POA: Diagnosis not present

## 2023-08-17 DIAGNOSIS — L03116 Cellulitis of left lower limb: Secondary | ICD-10-CM | POA: Diagnosis not present

## 2023-08-17 DIAGNOSIS — M545 Low back pain, unspecified: Secondary | ICD-10-CM | POA: Diagnosis not present

## 2023-08-17 DIAGNOSIS — M6281 Muscle weakness (generalized): Secondary | ICD-10-CM | POA: Diagnosis not present

## 2023-08-17 DIAGNOSIS — I10 Essential (primary) hypertension: Secondary | ICD-10-CM | POA: Diagnosis not present

## 2023-08-17 DIAGNOSIS — I341 Nonrheumatic mitral (valve) prolapse: Secondary | ICD-10-CM | POA: Diagnosis not present

## 2023-08-17 DIAGNOSIS — J9601 Acute respiratory failure with hypoxia: Secondary | ICD-10-CM | POA: Diagnosis not present

## 2023-08-17 DIAGNOSIS — J9621 Acute and chronic respiratory failure with hypoxia: Secondary | ICD-10-CM | POA: Diagnosis not present

## 2023-08-17 DIAGNOSIS — S2241XD Multiple fractures of ribs, right side, subsequent encounter for fracture with routine healing: Secondary | ICD-10-CM | POA: Diagnosis not present

## 2023-08-17 DIAGNOSIS — E876 Hypokalemia: Secondary | ICD-10-CM | POA: Diagnosis not present

## 2023-08-17 DIAGNOSIS — Z9049 Acquired absence of other specified parts of digestive tract: Secondary | ICD-10-CM | POA: Diagnosis not present

## 2023-08-17 DIAGNOSIS — E871 Hypo-osmolality and hyponatremia: Secondary | ICD-10-CM | POA: Diagnosis not present

## 2023-08-17 DIAGNOSIS — J111 Influenza due to unidentified influenza virus with other respiratory manifestations: Secondary | ICD-10-CM | POA: Diagnosis not present

## 2023-08-17 DIAGNOSIS — M533 Sacrococcygeal disorders, not elsewhere classified: Secondary | ICD-10-CM | POA: Diagnosis not present

## 2023-08-17 DIAGNOSIS — R0689 Other abnormalities of breathing: Secondary | ICD-10-CM | POA: Diagnosis not present

## 2023-08-17 DIAGNOSIS — J09X2 Influenza due to identified novel influenza A virus with other respiratory manifestations: Secondary | ICD-10-CM | POA: Diagnosis not present

## 2023-08-17 DIAGNOSIS — Z79899 Other long term (current) drug therapy: Secondary | ICD-10-CM | POA: Diagnosis not present

## 2023-08-17 DIAGNOSIS — J8410 Pulmonary fibrosis, unspecified: Secondary | ICD-10-CM | POA: Diagnosis not present

## 2023-08-17 DIAGNOSIS — E43 Unspecified severe protein-calorie malnutrition: Secondary | ICD-10-CM | POA: Diagnosis not present

## 2023-08-17 DIAGNOSIS — R278 Other lack of coordination: Secondary | ICD-10-CM | POA: Diagnosis not present

## 2023-08-17 DIAGNOSIS — Z79620 Long term (current) use of immunosuppressive biologic: Secondary | ICD-10-CM | POA: Diagnosis not present

## 2023-08-17 DIAGNOSIS — E8770 Fluid overload, unspecified: Secondary | ICD-10-CM | POA: Diagnosis not present

## 2023-08-17 DIAGNOSIS — E785 Hyperlipidemia, unspecified: Secondary | ICD-10-CM | POA: Diagnosis not present

## 2023-08-17 DIAGNOSIS — Z880 Allergy status to penicillin: Secondary | ICD-10-CM | POA: Diagnosis not present

## 2023-08-17 DIAGNOSIS — J849 Interstitial pulmonary disease, unspecified: Secondary | ICD-10-CM | POA: Diagnosis not present

## 2023-08-17 DIAGNOSIS — J984 Other disorders of lung: Secondary | ICD-10-CM | POA: Diagnosis not present

## 2023-08-17 DIAGNOSIS — J40 Bronchitis, not specified as acute or chronic: Secondary | ICD-10-CM | POA: Diagnosis not present

## 2023-08-17 DIAGNOSIS — Z7401 Bed confinement status: Secondary | ICD-10-CM | POA: Diagnosis not present

## 2023-08-17 DIAGNOSIS — I700 Atherosclerosis of aorta: Secondary | ICD-10-CM | POA: Diagnosis not present

## 2023-08-17 DIAGNOSIS — G9349 Other encephalopathy: Secondary | ICD-10-CM | POA: Diagnosis not present

## 2023-08-17 LAB — BLOOD CULTURE ID PANEL (REFLEXED) - BCID2

## 2023-08-17 LAB — URINE CULTURE

## 2023-08-17 LAB — BASIC METABOLIC PANEL
Anion gap: 7 (ref 5–15)
BUN: 9 mg/dL (ref 8–23)
CO2: 24 mmol/L (ref 22–32)
Calcium: 7.3 mg/dL — ABNORMAL LOW (ref 8.9–10.3)
Chloride: 98 mmol/L (ref 98–111)
Creatinine, Ser: 0.6 mg/dL (ref 0.44–1.00)
GFR, Estimated: >60 mL/min (ref >60)
Glucose, Bld: 95 mg/dL (ref 70–99)
Potassium: 3.7 mmol/L (ref 3.5–5.1)
Sodium: 129 mmol/L — ABNORMAL LOW (ref 135–145)

## 2023-08-17 MED ORDER — OSELTAMIVIR PHOSPHATE 30 MG PO CAPS: 30.0000 mg | ORAL_CAPSULE | Freq: Two times a day (BID) | ORAL | Status: AC

## 2023-08-17 MED ORDER — SODIUM CHLORIDE 0.9 % IV BOLUS
1000.0000 mL | Freq: Once | INTRAVENOUS | Status: AC
  Administered 2023-08-17: 1000 mL via INTRAVENOUS

## 2023-08-17 NOTE — Progress Notes (Signed)
 SATURATION QUALIFICATIONS: (This note is used to comply with regulatory documentation for home oxygen )  Patient Saturations on Room Air at Rest = 95%  Patient Saturations on Room Air while Ambulating = 77%  Patient Saturations on 2 Liters of oxygen  while Ambulating = 91%  Please briefly explain why patient needs home oxygen :

## 2023-08-17 NOTE — TOC Transition Note (Addendum)
 Transition of Care [facility]) - Discharge Note   Patient Details  Name: Stacey [name] MRN: [id number] Date of Birth: 27-Dec-1940  Transition of Care [facility]) CM/SW Contact:  Sheri ONEIDA Sharps, LCSW Phone Number: 08/17/2023, 3:38 PM   Clinical Narrative:     Pt medically ready to d/c to [facility] SNF room 606. Call to report [phone]. Pt  THN elgibility confirmed. Pt able to go to SNF w/o 3 midnights. Pt and pt son notified. PTAR called at 3:22pm. D/C packet left at nurses station. No further TOC needs.  Final next level of care: Skilled Nursing Facility [facility]) Barriers to Discharge: No Barriers Identified   Patient Goals and CMS Choice Patient states their goals for this hospitalization and ongoing recovery are:: return home following SNF stay CMS Medicare.gov Compare Post Acute Care list provided to:: Patient Choice offered to / list presented to : Patient [place] ownership interest in [facility].provided to:: Patient    Discharge Placement   Existing PASRR number confirmed : 08/16/23          Patient chooses bed at: [facility] Patient to be transferred to facility by: PTAR Name of family member notified: Alegra, Rost (Son)  [phone] [facility]) Patient and family notified of of transfer: 08/17/23  Discharge Plan and Services Additional resources added to the After Visit Summary for   In-house Referral: Clinical Social Work Discharge Planning Services: NA Post Acute Care Choice: Skilled Nursing Facility            DME Agency: NA                  Social Drivers of Health (SDOH) Interventions SDOH Screenings   Food Insecurity: No Food Insecurity (08/16/2023)  Housing: Low Risk  (08/16/2023)  Transportation Needs: No Transportation Needs (08/16/2023)  Utilities: Not At Risk (08/16/2023)  Depression (PHQ2-9): Low Risk  (10/24/2020)  Social Connections: Unknown (08/16/2023)  Tobacco Use: Low Risk  (08/16/2023)     Readmission Risk Interventions     08/16/2023    2:36 PM  Readmission Risk Prevention Plan  Transportation Screening Complete  PCP or Specialist Appt within 5-7 Days Complete  Home Care Screening Complete  Medication Review (RN CM) Complete

## 2023-08-17 NOTE — Progress Notes (Signed)
 Physical Therapy Treatment Patient Details Name: Stacey [name] MRN: [id number] DOB: 1941-06-18 Today's Date: 08/17/2023   History of Present Illness Stacey [name] is an 83 y.o. female presnets to ER 08/15/23  with several days of nausea ans vomiting. Positive for influenza A, mildly hyponatremic and AKI.SABRA PMH: interstitial lung disease, GERD rheumatoid arthritis essential hypertension    PT Comments  General Comments: AxO x 3 pleasant Lady who resides at an [facility] I just moved in 2 weeks ago. Pt in bed with nasal cannula on but oxygen  set at zero.  Replaced finger sensor to get a reading of 84%  Reapplied 2 lts.  Assisted OOB to amb to bathroom required increased time and effort.  General transfer comment: Pt required increased time and Mod Assist to rise with VC's on proper hand placement.  Also assisted with a toilet transfer.  Unsteady.  Weak. General Gait Details: Very limited amb ability 11 feet x 2 to and from bathroom with walker at Mod Asisst.  Unsteady esp with turns.  3/4 dyspnea with activity.  RA ranged from 84 - 92 %.I have Interstitial lung disease stated pt. Unable to tolerate amb in hallway after bathroom use, assisted back to bed.  Max c/o fatigue.   Prior to admit, pt was able to amb to the dinning room once a day, stated pt.  Pt will need ST Rehab at SNF to address mobility and functional decline prior to safely returning home.    If plan is discharge home, recommend the following:     Can travel by private vehicle     Yes  Equipment Recommendations  None recommended by PT    Recommendations for Other Services       Precautions / Restrictions Precautions Precautions: Fall Precaution Comments: monitor vitals Restrictions Weight Bearing Restrictions Per Provider Order: No     Mobility  Bed Mobility Overal bed mobility: Needs Assistance Bed Mobility: Supine to Sit, Sit to Supine     Supine to sit: Contact guard Sit to supine: Contact  guard assist, Min assist   General bed mobility comments: required increased time and increased assist back to bed due to fatigue    Transfers Overall transfer level: Needs assistance Equipment used: Rolling walker (2 wheels) Transfers: Sit to/from Stand Sit to Stand: Mod assist           General transfer comment: Pt required increased time and Mod Assist to rise with VC's on proper hand placement.  Also assisted with a toilet transfer.  Unsteady.  Weak.    Ambulation/Gait Ambulation/Gait assistance: Mod assist Gait Distance (Feet): 22 Feet (11 feet x 2) Assistive device: Rolling walker (2 wheels) Gait Pattern/deviations: Step-through pattern, Decreased stride length, Drifts right/left, Trunk flexed Gait velocity: decreased     General Gait Details: Very limited amb ability 11 feet x 2 to and from bathroom with walker at Mod Asisst.  Unsteady esp with turns.  3/4 dyspnea with activity.  RA ranged from 84 - 92 %.I have Interstitial lung disease stated pt.   Stairs             Wheelchair Mobility     Tilt Bed    Modified Rankin (Stroke Patients Only)       Balance  Cognition Arousal: Alert Behavior During Therapy: WFL for tasks assessed/performed Overall Cognitive Status: Within Functional Limits for tasks assessed                                 General Comments: AxO x 3 pleasant Lady who resides at an [facility] I just moved in 2 weeks ago.        Exercises      General Comments        Pertinent Vitals/Pain Pain Assessment Pain Assessment: No/denies pain    Home Living Family/patient expects to be discharged to:: Skilled nursing facility Living Arrangements: Alone                      Prior Function            PT Goals (current goals can now be found in the care plan section) Progress towards PT goals: Progressing toward goals     Frequency    Min 1X/week      PT Plan      Co-evaluation              AM-PAC PT 6 Clicks Mobility   Outcome Measure  Help needed turning from your back to your side while in a flat bed without using bedrails?: A Little Help needed moving from lying on your back to sitting on the side of a flat bed without using bedrails?: A Little Help needed moving to and from a bed to a chair (including a wheelchair)?: A Lot Help needed standing up from a chair using your arms (e.g., wheelchair or bedside chair)?: A Lot Help needed to walk in hospital room?: A Lot Help needed climbing 3-5 steps with a railing? : Total 6 Click Score: 13    End of Session Equipment Utilized During Treatment: Gait belt Activity Tolerance: Patient limited by fatigue Patient left: in bed;with call bell/phone within reach;with family/visitor present Nurse Communication: Mobility status PT Visit Diagnosis: Unsteadiness on feet (R26.81);Muscle weakness (generalized) (M62.81);Difficulty in walking, not elsewhere classified (R26.2)     Time: 8567-8493 PT Time Calculation (min) (ACUTE ONLY): 34 min  Charges:    $Gait Training: 8-22 mins $Therapeutic Activity: 8-22 mins PT General Charges $$ ACUTE PT VISIT: 1 Visit                     Katheryn Leap  PTA Acute  Rehabilitation Services Office M-F          [phone]

## 2023-08-17 NOTE — Discharge Summary (Addendum)
 Physician Discharge Summary  Stacey [name] FMW:[id number] DOB: 1940/12/18 DOA: 08/15/2023  PCP: Caleen Dirks, MD  Admit date: 08/15/2023 Discharge date: 08/17/2023  Admitted From: Home Disposition:  Home  Recommendations for Outpatient Follow-up:  Follow up with PCP in 1-2 weeks Please obtain BMP/CBC in one week   Home Health:No Equipment/Devices:None  Discharge Condition:Stable CODE STATUS: DNR Diet recommendation: Heart Healthy  Brief/Interim Summary: 83 y.o. female past medical history of interstitial lung disease, GERD rheumatoid arthritis essential hypertension comes to the ER for persistent nausea and vomiting that has been going on for 2 days prior to admission denies any abdominal pain or diarrhea.  She relates she feels weak and has fallen but no trauma.  Influenza a PCR was positive she is slightly hyponatremic and acute kidney injury   Discharge Diagnoses:  Principal Problem:   Nausea & vomiting Active Problems:   Rheumatoid arthritis (HCC)   Restrictive lung disease   ILD (interstitial lung disease) (HCC)   Fall at home, initial encounter   AKI (acute kidney injury) (HCC)   Acute respiratory failure with hypoxia (HCC)   Anxiety   Hyponatremia   Influenza A  Intractable nausea vomiting and diarrhea/acute respiratory failure with hypoxia likely due to influenza A: Initially she was requiring 2 L of oxygen  to keep saturation greater than 90%. KUB showed moderate stool burden no pelvic fracture. She was started on Tamiflu . Chest x-ray showed chronic interstitial lung disease for which she is on medication for. After conservative management her nausea improved she was able to tolerate her diet.  Acute kidney injury on chronic kidney disease stage I: Likely hemodynamically mediated resolved with IV fluid resuscitation.  Essential hypertension: No changes made to her medication continue current regimen.  Hypovolemic hyponatremia: She has been fluid resuscitated  her sodium improved.  Hyperlipidemia: Continue statins.  Chronic interstitial lung disease continue current regimen.  Rheumatoid arthritis: Continue Rituxan .  Neuropathic pain: No change made to her medication initially were held due to her acute kidney injury she will resume them as an outpatient.  Anxiety: Continue Effexor .   Discharge Instructions  Discharge Instructions     Diet - low sodium heart healthy   Complete by: As directed    Increase activity slowly   Complete by: As directed       Allergies as of 08/17/2023       Reactions   Penicillins Other (See Comments)   Unknown reaction Did it involve swelling of the face/tongue/throat, SOB, or low BP? Unknown Did it involve sudden or severe rash/hives, skin peeling, or any reaction on the inside of your mouth or nose? Unknown Did you need to seek medical attention at a hospital or doctor's office? No When did it last happen?     childhood  If all above answers are NO, may proceed with cephalosporin use.   Remicade [infliximab] Other (See Comments)   Unknown reaction        Medication List     STOP taking these medications    BIOTIN PO       TAKE these medications    acetaminophen  500 MG tablet Commonly known as: TYLENOL  Take 1,000 mg by mouth every 6 (six) hours as needed for moderate pain (pain score 4-6).   amitriptyline  25 MG tablet Commonly known as: ELAVIL  Take 25 mg by mouth at bedtime.   feeding supplement Liqd Take 237 mLs by mouth 2 (two) times daily between meals. What changed:  when to take this reasons to take this  gabapentin  300 MG capsule Commonly known as: NEURONTIN  Take 600 mg by mouth at bedtime.   losartan  25 MG tablet Commonly known as: COZAAR  Take 25 mg by mouth daily.   Ofev  100 MG Caps Generic drug: Nintedanib  Take 1 capsule (100 mg total) by mouth 2 (two) times daily.   ondansetron  4 MG tablet Commonly known as: ZOFRAN  TAKE ONE TABLET BY MOUTH EVERY 8  HOURS AS NEEDED FOR NAUSEA AND VOMITING What changed: See the new instructions.   oseltamivir  30 MG capsule Commonly known as: TAMIFLU  Take 1 capsule (30 mg total) by mouth 2 (two) times daily for 3 days.   pantoprazole  40 MG tablet Commonly known as: PROTONIX  Take 40 mg by mouth daily.   polyethylene glycol 17 g packet Commonly known as: MIRALAX  / GLYCOLAX  Take 17 g by mouth daily as needed for mild constipation.   pravastatin  20 MG tablet Commonly known as: PRAVACHOL  Take 1 tablet (20 mg total) by mouth daily.   RITUXAN  IV Inject 1,000 mg into the vein every 6 (six) months.   venlafaxine  XR 75 MG 24 hr capsule Commonly known as: EFFEXOR -XR Take 75 mg by mouth at bedtime.   VITAMIN D -3 PO Take 1 capsule by mouth daily.               Durable Medical Equipment  (From admission, onward)           Start     Ordered   08/17/23 1052  For home use only DME oxygen   Once       Question Answer Comment  Length of Need 6 Months   Mode or (Route) Nasal cannula   Liters per Minute 2   Frequency Continuous (stationary and portable oxygen  unit needed)   Oxygen  delivery system Gas      08/17/23 1051            Allergies  Allergen Reactions   Penicillins Other (See Comments)    Unknown reaction  Did it involve swelling of the face/tongue/throat, SOB, or low BP? Unknown Did it involve sudden or severe rash/hives, skin peeling, or any reaction on the inside of your mouth or nose? Unknown Did you need to seek medical attention at a hospital or doctor's office? No When did it last happen?     childhood  If all above answers are NO, may proceed with cephalosporin use.     Remicade [Infliximab] Other (See Comments)    Unknown reaction    Consultations: No complaints   Procedures/Studies: DG Abd 1 View Result Date: 08/16/2023 CLINICAL DATA:  Nausea, vomiting EXAM: ABDOMEN - 1 VIEW COMPARISON:  None Available. FINDINGS: Normal abdominal gas pattern. Moderate  stool burden. Cholecystectomy clips are seen in the right upper quadrant. Underpenetration precludes evaluation of the visceral shadows. Vascular calcifications are noted. Osseous structures are age-appropriate with degenerative changes noted at the lumbosacral junction and asymmetrically within the left hip. IMPRESSION: 1. Moderate stool burden. No obstruction. Electronically Signed   By: Dorethia Molt M.D.   On: 08/16/2023 03:11   DG Pelvis 1-2 Views Result Date: 08/16/2023 CLINICAL DATA:  Fall. EXAM: PELVIS - 1-2 VIEW COMPARISON:  10/08/2021 FINDINGS: Degenerative changes in the lower lumbar spine and both hips. Pelvis and hips appear intact. No evidence of acute fracture or dislocation. No focal bone lesion or bone destruction. SI joints and symphysis pubis are not displaced. IMPRESSION: Degenerative changes in the hips.  No acute bony abnormalities. Electronically Signed   By: Elsie Mannie HERO.D.  On: 08/16/2023 01:41   DG Chest Port 1 View Result Date: 08/15/2023 CLINICAL DATA:  Shortness of breath. Cough, congestion, and fatigue. Negative COVID test. Wheezing. Decreased oxygenation. EXAM: PORTABLE CHEST 1 VIEW COMPARISON:  06/24/2023 FINDINGS: Heart size and pulmonary vascularity are normal. Bronchiectasis and interstitial changes in the lungs likely representing chronic bronchitis and fibrosis. Similar appearance to previous studies. No airspace disease or consolidation. No pleural effusion or pneumothorax. Mediastinal contours appear intact. Old right rib fractures. Surgical clips in the right upper quadrant. Calcification of the aorta. IMPRESSION: Chronic bronchitic changes in the lungs. 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 No focal consolidation. Electronically Signed   By: Elsie Gravely M.D.   On: 08/15/2023 21:41   Subjective: No complaints  Discharge Exam: Vitals:   08/17/23 0022 08/17/23 0507  BP: (!) 149/57 136/74  Pulse: 76 81  Resp:    Temp: 99.4 F (37.4 C) 98.3 F (36.8 C)   SpO2: 97% 100%   Vitals:   08/16/23 2041 08/17/23 0022 08/17/23 0500 08/17/23 0507  BP: 134/64 (!) 149/57  136/74  Pulse: 73 76  81  Resp:      Temp: 98.4 F (36.9 C) 99.4 F (37.4 C)  98.3 F (36.8 C)  TempSrc:      SpO2: 98% 97%  100%  Weight:   63.9 kg   Height:        General: Pt is alert, awake, not in acute distress Cardiovascular: RRR, S1/S2 +, no rubs, no gallops Respiratory: CTA bilaterally, no wheezing, no rhonchi Abdominal: Soft, NT, ND, bowel sounds + Extremities: no edema, no cyanosis    The results of significant diagnostics from this hospitalization (including imaging, microbiology, ancillary and laboratory) are listed below for reference.     Microbiology: Recent Results (from the past 240 hours)  Resp panel by RT-PCR (RSV, Flu A&B, Covid) Anterior Nasal Swab     Status: Abnormal   Collection Time: 08/15/23  9:15 PM   Specimen: Anterior Nasal Swab  Result Value Ref Range Status   SARS Coronavirus 2 by RT PCR NEGATIVE NEGATIVE Final    Comment: (NOTE) SARS-CoV-2 target nucleic acids are NOT DETECTED.  The SARS-CoV-2 RNA is generally detectable in upper respiratory specimens during the acute phase of infection. The lowest concentration of SARS-CoV-2 viral copies this assay can detect is 138 copies/mL. A negative result does not preclude SARS-Cov-2 infection and should not be used as the sole basis for treatment or other patient management decisions. A negative result may occur with  improper specimen collection/handling, submission of specimen other than nasopharyngeal swab, presence of viral mutation(s) within the areas targeted by this assay, and inadequate number of viral copies(<138 copies/mL). A negative result must be combined with clinical observations, patient history, and epidemiological information. The expected result is Negative.  Fact Sheet for Patients:  bloggercourse.com  Fact Sheet for Healthcare Providers:   seriousbroker.it  This test is no t yet approved or cleared by the [place]  FDA and  has been authorized for detection and/or diagnosis of SARS-CoV-2 by FDA under an Emergency Use Authorization (EUA). This EUA will remain  in effect (meaning this test can be used) for the duration of the COVID-19 declaration under Section 564(b)(1) of the Act, 21 U.S.C.section 360bbb-3(b)(1), unless the authorization is terminated  or revoked sooner.       Influenza A by PCR POSITIVE (A) NEGATIVE Final   Influenza B by PCR NEGATIVE NEGATIVE Final    Comment: (NOTE) The Xpert Xpress SARS-CoV-2/FLU/RSV plus  assay is intended as an aid in the diagnosis of influenza from Nasopharyngeal swab specimens and should not be used as a sole basis for treatment. Nasal washings and aspirates are unacceptable for Xpert Xpress SARS-CoV-2/FLU/RSV testing.  Fact Sheet for Patients: bloggercourse.com  Fact Sheet for Healthcare Providers: seriousbroker.it  This test is not yet approved or cleared by the [place]  FDA and has been authorized for detection and/or diagnosis of SARS-CoV-2 by FDA under an Emergency Use Authorization (EUA). This EUA will remain in effect (meaning this test can be used) for the duration of the COVID-19 declaration under Section 564(b)(1) of the Act, 21 U.S.C. section 360bbb-3(b)(1), unless the authorization is terminated or revoked.     Resp Syncytial Virus by PCR NEGATIVE NEGATIVE Final    Comment: (NOTE) Fact Sheet for Patients: bloggercourse.com  Fact Sheet for Healthcare Providers: seriousbroker.it  This test is not yet approved or cleared by the [place]  FDA and has been authorized for detection and/or diagnosis of SARS-CoV-2 by FDA under an Emergency Use Authorization (EUA). This EUA will remain in effect (meaning this test can be used)  for the duration of the COVID-19 declaration under Section 564(b)(1) of the Act, 21 U.S.C. section 360bbb-3(b)(1), unless the authorization is terminated or revoked.  Performed at [facility], [address], [place], [place] [address]   Culture, blood (routine x 2)     Status: None (Preliminary result)   Collection Time: 08/15/23  9:15 PM   Specimen: BLOOD  Result Value Ref Range Status   Specimen Description   Final    BLOOD RIGHT ARM Performed at [facility], [address], [place], [place] [address]    Special Requests   Final    BOTTLES DRAWN AEROBIC AND ANAEROBIC Blood Culture results may not be optimal due to an inadequate volume of blood received in culture bottles Performed at [facility], [address], [place], [place] [address]    Culture   Final    NO GROWTH 1 DAY Performed at [facility], [address] [place], [place] [address]    Report Status PENDING  Incomplete  Culture, blood (routine x 2)     Status: Abnormal (Preliminary result)   Collection Time: 08/15/23  9:15 PM   Specimen: BLOOD  Result Value Ref Range Status   Specimen Description   Final    BLOOD LEFT ARM Performed at [facility], [address] [place], [place] [address]    Special Requests   Final    BOTTLES DRAWN AEROBIC AND ANAEROBIC Blood Culture adequate volume Performed at [facility], [address], [place], [place] [address]    Culture  Setup Time   Final    GRAM POSITIVE COCCI IN BOTH AEROBIC AND ANAEROBIC BOTTLES CRITICAL RESULT CALLED TO, READ BACK BY AND VERIFIED WITH: E JACKSON,[email or web address]  08/17/23 MK    Culture (A)  Final    STAPHYLOCOCCUS EPIDERMIDIS THE SIGNIFICANCE OF ISOLATING THIS ORGANISM FROM A SINGLE SET OF BLOOD CULTURES WHEN MULTIPLE SETS ARE DRAWN IS UNCERTAIN. PLEASE NOTIFY THE MICROBIOLOGY DEPARTMENT WITHIN ONE WEEK IF SPECIATION AND SENSITIVITIES ARE REQUIRED. Performed at [facility], [address] [place], [place] [address]    Report Status PENDING  Incomplete  Blood Culture ID Panel (Reflexed)     Status: Abnormal   Collection Time: 08/15/23  9:15 PM  Result Value Ref Range Status   Enterococcus faecalis NOT DETECTED NOT DETECTED  Final   Enterococcus Faecium NOT DETECTED NOT DETECTED Final   Listeria monocytogenes NOT DETECTED NOT DETECTED Final   Staphylococcus species DETECTED (A) NOT DETECTED Final    Comment: CRITICAL RESULT CALLED TO, READ BACK BY AND VERIFIED WITH: E JACKSON,[email or web address]  08/17/23 MK    Staphylococcus aureus (BCID) NOT DETECTED NOT DETECTED Final   Staphylococcus epidermidis DETECTED (A) NOT DETECTED Final    Comment: Methicillin (oxacillin) resistant coagulase negative staphylococcus. Possible blood culture contaminant (unless isolated from more than one blood culture draw or clinical case suggests pathogenicity). No antibiotic treatment is indicated for blood  culture contaminants. CRITICAL RESULT CALLED TO, READ BACK BY AND VERIFIED WITH: E JACKSON,[email or web address]  08/17/23 MK    Staphylococcus lugdunensis NOT DETECTED NOT DETECTED Final   Streptococcus species NOT DETECTED NOT DETECTED Final   Streptococcus agalactiae NOT DETECTED NOT DETECTED Final   Streptococcus pneumoniae NOT DETECTED NOT DETECTED Final   Streptococcus pyogenes NOT DETECTED NOT DETECTED Final   A.calcoaceticus-baumannii NOT DETECTED NOT DETECTED Final   Bacteroides fragilis NOT DETECTED NOT DETECTED Final   Enterobacterales NOT DETECTED NOT DETECTED Final   Enterobacter cloacae complex NOT DETECTED NOT DETECTED Final   Escherichia coli NOT DETECTED NOT DETECTED Final   Klebsiella aerogenes NOT DETECTED NOT DETECTED Final   Klebsiella oxytoca NOT DETECTED NOT DETECTED Final   Klebsiella pneumoniae NOT DETECTED NOT DETECTED Final   Proteus species NOT DETECTED NOT DETECTED Final   Salmonella species NOT DETECTED NOT DETECTED Final   Serratia marcescens NOT  DETECTED NOT DETECTED Final   Haemophilus influenzae NOT DETECTED NOT DETECTED Final   Neisseria meningitidis NOT DETECTED NOT DETECTED Final   Pseudomonas aeruginosa NOT DETECTED NOT DETECTED Final   Stenotrophomonas maltophilia NOT DETECTED NOT DETECTED Final   Candida albicans NOT DETECTED NOT DETECTED Final   Candida auris NOT DETECTED NOT DETECTED Final   Candida glabrata NOT DETECTED NOT DETECTED Final   Candida krusei NOT DETECTED NOT DETECTED Final   Candida parapsilosis NOT DETECTED NOT DETECTED Final   Candida tropicalis NOT DETECTED NOT DETECTED Final   Cryptococcus neoformans/gattii NOT DETECTED NOT DETECTED Final   Methicillin resistance mecA/C DETECTED (A) NOT DETECTED Final    Comment: CRITICAL RESULT CALLED TO, READ BACK BY AND VERIFIED WITH: E JACKSON,[email or web address]  08/17/23 MK Performed at [facility], [address] [place], [place] [address]   Urine Culture     Status: Abnormal   Collection Time: 08/16/23 12:50 AM   Specimen: Urine, Random  Result Value Ref Range Status   Specimen Description   Final    URINE, RANDOM Performed at [facility], [address], [place], [place] [address]    Special Requests   Final    NONE Reflexed from [id number] Performed at [facility], [address], [place], [place] [address]    Culture MULTIPLE SPECIES PRESENT, SUGGEST RECOLLECTION (A)  Final   Report Status 08/17/2023 FINAL  Final     Labs: BNP (last 3 results) No results for input(s): BNP in the last 8760 hours. Basic Metabolic Panel: Recent Labs  Lab 08/15/23 2115 08/16/23 0020 08/16/23 0825 08/17/23 0536  NA 128*  --  134* 129*  K 4.2  --  3.8 3.7  CL 92*  --  102 98  CO2 26  --  22 24  GLUCOSE 86  --  73 95  BUN 20  --  18 9  CREATININE 1.02* 0.71 0.77 0.60  CALCIUM  8.1*  --  7.3* 7.3*  Liver Function Tests: Recent Labs  Lab 08/15/23 2115 08/16/23 0825  AST 83* 78*  ALT 28 21  ALKPHOS 78 43  BILITOT  0.7 0.6  PROT 6.5 5.4*  ALBUMIN 3.6 3.0*   No results for input(s): LIPASE, AMYLASE in the last 168 hours. No results for input(s): AMMONIA in the last 168 hours. CBC: Recent Labs  Lab 08/15/23 2115 08/16/23 0020 08/16/23 0825  WBC 6.7 6.0 7.1  NEUTROABS 4.1  --   --   HGB 12.6 11.1* 11.4*  HCT 39.8 34.5* 35.5*  MCV 92.3 91.8 93.2  PLT 169 152 142*   Cardiac Enzymes: No results for input(s): CKTOTAL, CKMB, CKMBINDEX, TROPONINI in the last 168 hours. BNP: Invalid input(s): POCBNP CBG: No results for input(s): GLUCAP in the last 168 hours. D-Dimer No results for input(s): DDIMER in the last 72 hours. Hgb A1c No results for input(s): HGBA1C in the last 72 hours. Lipid Profile No results for input(s): CHOL, HDL, LDLCALC, TRIG, CHOLHDL, LDLDIRECT in the last 72 hours. Thyroid  function studies No results for input(s): TSH, T4TOTAL, T3FREE, THYROIDAB in the last 72 hours.  Invalid input(s): FREET3 Anemia work up No results for input(s): VITAMINB12, FOLATE, FERRITIN, TIBC, IRON, RETICCTPCT in the last 72 hours. Urinalysis    Component Value Date/Time   COLORURINE YELLOW 08/16/2023 0050   APPEARANCEUR HAZY (A) 08/16/2023 0050   LABSPEC 1.024 08/16/2023 0050   PHURINE 5.0 08/16/2023 0050   GLUCOSEU NEGATIVE 08/16/2023 0050   HGBUR MODERATE (A) 08/16/2023 0050   BILIRUBINUR NEGATIVE 08/16/2023 0050   KETONESUR 20 (A) 08/16/2023 0050   PROTEINUR 30 (A) 08/16/2023 0050   UROBILINOGEN 0.2 12/18/2009 0120   NITRITE NEGATIVE 08/16/2023 0050   LEUKOCYTESUR MODERATE (A) 08/16/2023 0050   Sepsis Labs Recent Labs  Lab 08/15/23 2115 08/16/23 0020 08/16/23 0825  WBC 6.7 6.0 7.1   Microbiology Recent Results (from the past 240 hours)  Resp panel by RT-PCR (RSV, Flu A&B, Covid) Anterior Nasal Swab     Status: Abnormal   Collection Time: 08/15/23  9:15 PM   Specimen: Anterior Nasal Swab  Result Value Ref Range Status    SARS Coronavirus 2 by RT PCR NEGATIVE NEGATIVE Final    Comment: (NOTE) SARS-CoV-2 target nucleic acids are NOT DETECTED.  The SARS-CoV-2 RNA is generally detectable in upper respiratory specimens during the acute phase of infection. The lowest concentration of SARS-CoV-2 viral copies this assay can detect is 138 copies/mL. A negative result does not preclude SARS-Cov-2 infection and should not be used as the sole basis for treatment or other patient management decisions. A negative result may occur with  improper specimen collection/handling, submission of specimen other than nasopharyngeal swab, presence of viral mutation(s) within the areas targeted by this assay, and inadequate number of viral copies(<138 copies/mL). A negative result must be combined with clinical observations, patient history, and epidemiological information. The expected result is Negative.  Fact Sheet for Patients:  bloggercourse.com  Fact Sheet for Healthcare Providers:  seriousbroker.it  This test is no t yet approved or cleared by the [place]  FDA and  has been authorized for detection and/or diagnosis of SARS-CoV-2 by FDA under an Emergency Use Authorization (EUA). This EUA will remain  in effect (meaning this test can be used) for the duration of the COVID-19 declaration under Section 564(b)(1) of the Act, 21 U.S.C.section 360bbb-3(b)(1), unless the authorization is terminated  or revoked sooner.       Influenza A by PCR POSITIVE (A) NEGATIVE Final  Influenza B by PCR NEGATIVE NEGATIVE Final    Comment: (NOTE) The Xpert Xpress SARS-CoV-2/FLU/RSV plus assay is intended as an aid in the diagnosis of influenza from Nasopharyngeal swab specimens and should not be used as a sole basis for treatment. Nasal washings and aspirates are unacceptable for Xpert Xpress SARS-CoV-2/FLU/RSV testing.  Fact Sheet for  Patients: bloggercourse.com  Fact Sheet for Healthcare Providers: seriousbroker.it  This test is not yet approved or cleared by the [place]  FDA and has been authorized for detection and/or diagnosis of SARS-CoV-2 by FDA under an Emergency Use Authorization (EUA). This EUA will remain in effect (meaning this test can be used) for the duration of the COVID-19 declaration under Section 564(b)(1) of the Act, 21 U.S.C. section 360bbb-3(b)(1), unless the authorization is terminated or revoked.     Resp Syncytial Virus by PCR NEGATIVE NEGATIVE Final    Comment: (NOTE) Fact Sheet for Patients: bloggercourse.com  Fact Sheet for Healthcare Providers: seriousbroker.it  This test is not yet approved or cleared by the [place]  FDA and has been authorized for detection and/or diagnosis of SARS-CoV-2 by FDA under an Emergency Use Authorization (EUA). This EUA will remain in effect (meaning this test can be used) for the duration of the COVID-19 declaration under Section 564(b)(1) of the Act, 21 U.S.C. section 360bbb-3(b)(1), unless the authorization is terminated or revoked.  Performed at [facility], [address], [place], [place] [address]   Culture, blood (routine x 2)     Status: None (Preliminary result)   Collection Time: 08/15/23  9:15 PM   Specimen: BLOOD  Result Value Ref Range Status   Specimen Description   Final    BLOOD RIGHT ARM Performed at [facility], [address], [place], [place] [address]    Special Requests   Final    BOTTLES DRAWN AEROBIC AND ANAEROBIC Blood Culture results may not be optimal due to an inadequate volume of blood received in culture bottles Performed at [facility], [address] [place], [place] [address]    Culture   Final    NO GROWTH 1 DAY Performed at [facility], [address] [place], [place] [address]    Report Status PENDING  Incomplete  Culture, blood (routine x 2)     Status: Abnormal (Preliminary result)   Collection Time: 08/15/23  9:15 PM   Specimen: BLOOD  Result Value Ref Range Status   Specimen Description   Final    BLOOD LEFT ARM Performed at [facility], [address], [place], [place] [address]    Special Requests   Final    BOTTLES DRAWN AEROBIC AND ANAEROBIC Blood Culture adequate volume Performed at [facility], [address] [place], [place] [address]    Culture  Setup Time   Final    GRAM POSITIVE COCCI IN BOTH AEROBIC AND ANAEROBIC BOTTLES CRITICAL RESULT CALLED TO, READ BACK BY AND VERIFIED WITH: E JACKSON,[email or web address]  08/17/23 MK    Culture (A)  Final    STAPHYLOCOCCUS EPIDERMIDIS THE SIGNIFICANCE OF ISOLATING THIS ORGANISM FROM A SINGLE SET OF BLOOD CULTURES WHEN MULTIPLE SETS ARE DRAWN IS UNCERTAIN. PLEASE NOTIFY THE MICROBIOLOGY DEPARTMENT WITHIN ONE WEEK IF SPECIATION AND SENSITIVITIES ARE REQUIRED. Performed at [facility], [address] [place], [place] [address]    Report Status PENDING  Incomplete  Blood Culture ID Panel (Reflexed)     Status: Abnormal   Collection Time: 08/15/23  9:15 PM  Result Value Ref Range Status   Enterococcus faecalis NOT DETECTED NOT DETECTED Final   Enterococcus Faecium NOT DETECTED NOT DETECTED Final   Listeria monocytogenes NOT DETECTED NOT DETECTED Final   Staphylococcus species DETECTED (A) NOT DETECTED Final    Comment: CRITICAL RESULT CALLED TO, READ BACK BY AND VERIFIED WITH: E JACKSON,PHARMD@[id number]  08/17/23 MK    Staphylococcus aureus (BCID) NOT DETECTED NOT DETECTED Final   Staphylococcus epidermidis DETECTED (A) NOT DETECTED Final    Comment: Methicillin (oxacillin) resistant coagulase negative staphylococcus. Possible blood culture contaminant (unless isolated from more than one blood culture draw or clinical case suggests pathogenicity). No  antibiotic treatment is indicated for blood  culture contaminants. CRITICAL RESULT CALLED TO, READ BACK BY AND VERIFIED WITH: E JACKSON,PHARMD@[id number]  08/17/23 MK    Staphylococcus lugdunensis NOT DETECTED NOT DETECTED Final   Streptococcus species NOT DETECTED NOT DETECTED Final   Streptococcus agalactiae NOT DETECTED NOT DETECTED Final   Streptococcus pneumoniae NOT DETECTED NOT DETECTED Final   Streptococcus pyogenes NOT DETECTED NOT DETECTED Final   A.calcoaceticus-baumannii NOT DETECTED NOT DETECTED Final   Bacteroides fragilis NOT DETECTED NOT DETECTED Final   Enterobacterales NOT DETECTED NOT DETECTED Final   Enterobacter cloacae complex NOT DETECTED NOT DETECTED Final   Escherichia coli NOT DETECTED NOT DETECTED Final   Klebsiella aerogenes NOT DETECTED NOT DETECTED Final   Klebsiella oxytoca NOT DETECTED NOT DETECTED Final   Klebsiella pneumoniae NOT DETECTED NOT DETECTED Final   Proteus species NOT DETECTED NOT DETECTED Final   Salmonella species NOT DETECTED NOT DETECTED Final   Serratia marcescens NOT DETECTED NOT DETECTED Final   Haemophilus influenzae NOT DETECTED NOT DETECTED Final   Neisseria meningitidis NOT DETECTED NOT DETECTED Final   Pseudomonas aeruginosa NOT DETECTED NOT DETECTED Final   Stenotrophomonas maltophilia NOT DETECTED NOT DETECTED Final   Candida albicans NOT DETECTED NOT DETECTED Final   Candida auris NOT DETECTED NOT DETECTED Final   Candida glabrata NOT DETECTED NOT DETECTED Final   Candida krusei NOT DETECTED NOT DETECTED Final   Candida parapsilosis NOT DETECTED NOT DETECTED Final   Candida tropicalis NOT DETECTED NOT DETECTED Final   Cryptococcus neoformans/gattii NOT DETECTED NOT DETECTED Final   Methicillin resistance mecA/C DETECTED (A) NOT DETECTED Final    Comment: CRITICAL RESULT CALLED TO, READ BACK BY AND VERIFIED WITH: E JACKSON,[id number]  08/17/23 MK Performed at [facility], [address] [place], [place] [address]   Urine  Culture     Status: Abnormal   Collection Time: 08/16/23 12:50 AM   Specimen: Urine, Random  Result Value Ref Range Status   Specimen Description   Final    URINE, RANDOM Performed at [facility], [address], [place], [place] [address]    Special Requests   Final    NONE Reflexed from [id number] Performed at [facility], [address] [place], [place] [address]    Culture MULTIPLE SPECIES PRESENT, SUGGEST RECOLLECTION (A)  Final   Report Status 08/17/2023 FINAL  Final     Time coordinating discharge: Over 35 minutes  SIGNED:   Erle Odell Castor, MD  [facility] 08/17/2023, 10:53 AM Pager   If 7PM-7AM, please contact night-coverage [email or web address] Password [email or web address]

## 2023-08-17 NOTE — Progress Notes (Signed)
 PHARMACY - PHYSICIAN COMMUNICATION CRITICAL VALUE ALERT - BLOOD CULTURE IDENTIFICATION (BCID)  Stacey [name] is an 83 y.o. female who presented to [facility] on 08/15/2023 with a chief complaint of persistent nausea and vomiting   Assessment:  2/4 staph epi, MecA+  Name of physician (or Provider) Contacted: JINNY Kipper  Current antibiotics: none  Changes to prescribed antibiotics recommended:  None, probable contaminant  Results for orders placed or performed during the hospital encounter of 08/15/23  Blood Culture ID Panel (Reflexed) (Collected: 08/15/2023  9:15 PM)  Result Value Ref Range   Enterococcus faecalis NOT DETECTED NOT DETECTED   Enterococcus Faecium NOT DETECTED NOT DETECTED   Listeria monocytogenes NOT DETECTED NOT DETECTED   Staphylococcus species DETECTED (A) NOT DETECTED   Staphylococcus aureus (BCID) NOT DETECTED NOT DETECTED   Staphylococcus epidermidis DETECTED (A) NOT DETECTED   Staphylococcus lugdunensis NOT DETECTED NOT DETECTED   Streptococcus species NOT DETECTED NOT DETECTED   Streptococcus agalactiae NOT DETECTED NOT DETECTED   Streptococcus pneumoniae NOT DETECTED NOT DETECTED   Streptococcus pyogenes NOT DETECTED NOT DETECTED   A.calcoaceticus-baumannii NOT DETECTED NOT DETECTED   Bacteroides fragilis NOT DETECTED NOT DETECTED   Enterobacterales NOT DETECTED NOT DETECTED   Enterobacter cloacae complex NOT DETECTED NOT DETECTED   Escherichia coli NOT DETECTED NOT DETECTED   Klebsiella aerogenes NOT DETECTED NOT DETECTED   Klebsiella oxytoca NOT DETECTED NOT DETECTED   Klebsiella pneumoniae NOT DETECTED NOT DETECTED   Proteus species NOT DETECTED NOT DETECTED   Salmonella species NOT DETECTED NOT DETECTED   Serratia marcescens NOT DETECTED NOT DETECTED   Haemophilus influenzae NOT DETECTED NOT DETECTED   Neisseria meningitidis NOT DETECTED NOT DETECTED   Pseudomonas aeruginosa NOT DETECTED NOT DETECTED   Stenotrophomonas maltophilia NOT DETECTED NOT  DETECTED   Candida albicans NOT DETECTED NOT DETECTED   Candida auris NOT DETECTED NOT DETECTED   Candida glabrata NOT DETECTED NOT DETECTED   Candida krusei NOT DETECTED NOT DETECTED   Candida parapsilosis NOT DETECTED NOT DETECTED   Candida tropicalis NOT DETECTED NOT DETECTED   Cryptococcus neoformans/gattii NOT DETECTED NOT DETECTED   Methicillin resistance mecA/C DETECTED (A) NOT DETECTED    Leeroy Mace RPh 08/17/2023, 3:33 AM

## 2023-08-18 DIAGNOSIS — J09X2 Influenza due to identified novel influenza A virus with other respiratory manifestations: Secondary | ICD-10-CM | POA: Diagnosis not present

## 2023-08-18 DIAGNOSIS — E785 Hyperlipidemia, unspecified: Secondary | ICD-10-CM | POA: Diagnosis not present

## 2023-08-18 DIAGNOSIS — E43 Unspecified severe protein-calorie malnutrition: Secondary | ICD-10-CM | POA: Diagnosis not present

## 2023-08-18 DIAGNOSIS — S2241XD Multiple fractures of ribs, right side, subsequent encounter for fracture with routine healing: Secondary | ICD-10-CM | POA: Diagnosis not present

## 2023-08-20 LAB — CULTURE, BLOOD (ROUTINE X 2): Special Requests: ADEQUATE

## 2023-08-21 LAB — CULTURE, BLOOD (ROUTINE X 2): Culture: NO GROWTH

## 2023-08-23 DIAGNOSIS — S2241XD Multiple fractures of ribs, right side, subsequent encounter for fracture with routine healing: Secondary | ICD-10-CM | POA: Diagnosis not present

## 2023-08-23 DIAGNOSIS — E785 Hyperlipidemia, unspecified: Secondary | ICD-10-CM | POA: Diagnosis not present

## 2023-08-23 DIAGNOSIS — E43 Unspecified severe protein-calorie malnutrition: Secondary | ICD-10-CM | POA: Diagnosis not present

## 2023-08-23 DIAGNOSIS — J09X2 Influenza due to identified novel influenza A virus with other respiratory manifestations: Secondary | ICD-10-CM | POA: Diagnosis not present

## 2023-08-26 ENCOUNTER — Encounter (INDEPENDENT_AMBULATORY_CARE_PROVIDER_SITE_OTHER): Payer: Medicare Other | Admitting: Internal Medicine

## 2023-08-26 ENCOUNTER — Ambulatory Visit: Payer: Medicare Other | Admitting: Internal Medicine

## 2023-08-26 ENCOUNTER — Telehealth: Payer: Self-pay | Admitting: Internal Medicine

## 2023-08-26 ENCOUNTER — Encounter: Payer: Self-pay | Admitting: Internal Medicine

## 2023-08-26 VITALS — BP 153/72 | HR 82 | Temp 97.9°F | Ht 66.0 in | Wt 127.2 lb

## 2023-08-26 DIAGNOSIS — R600 Localized edema: Secondary | ICD-10-CM

## 2023-08-26 DIAGNOSIS — Z79899 Other long term (current) drug therapy: Secondary | ICD-10-CM

## 2023-08-26 DIAGNOSIS — Z8709 Personal history of other diseases of the respiratory system: Secondary | ICD-10-CM

## 2023-08-26 DIAGNOSIS — R0989 Other specified symptoms and signs involving the circulatory and respiratory systems: Secondary | ICD-10-CM

## 2023-08-26 DIAGNOSIS — R0609 Other forms of dyspnea: Secondary | ICD-10-CM

## 2023-08-26 DIAGNOSIS — J849 Interstitial pulmonary disease, unspecified: Secondary | ICD-10-CM | POA: Diagnosis not present

## 2023-08-26 DIAGNOSIS — Z006 Encounter for examination for normal comparison and control in clinical research program: Secondary | ICD-10-CM

## 2023-08-26 LAB — CBC WITH DIFFERENTIAL/PLATELET
Basophils Absolute: 0 10*3/uL (ref 0.0–0.1)
Basophils Relative: 0.5 % (ref 0.0–3.0)
Eosinophils Absolute: 0.5 10*3/uL (ref 0.0–0.7)
Eosinophils Relative: 5.9 % — ABNORMAL HIGH (ref 0.0–5.0)
HCT: 33.7 % — ABNORMAL LOW (ref 36.0–46.0)
Hemoglobin: 11.2 g/dL — ABNORMAL LOW (ref 12.0–15.0)
Lymphocytes Relative: 29.3 % (ref 12.0–46.0)
Lymphs Abs: 2.5 10*3/uL (ref 0.7–4.0)
MCHC: 33.1 g/dL (ref 30.0–36.0)
MCV: 88.6 fL (ref 78.0–100.0)
Monocytes Absolute: 1.3 10*3/uL — ABNORMAL HIGH (ref 0.1–1.0)
Monocytes Relative: 14.8 % — ABNORMAL HIGH (ref 3.0–12.0)
Neutro Abs: 4.3 10*3/uL (ref 1.4–7.7)
Neutrophils Relative %: 49.5 % (ref 43.0–77.0)
Platelets: 454 10*3/uL — ABNORMAL HIGH (ref 150.0–400.0)
RBC: 3.81 Mil/uL — ABNORMAL LOW (ref 3.87–5.11)
RDW: 14.3 % (ref 11.5–15.5)
WBC: 8.6 10*3/uL (ref 4.0–10.5)

## 2023-08-26 LAB — COMPREHENSIVE METABOLIC PANEL
ALT: 18 U/L (ref 0–35)
AST: 30 U/L (ref 0–37)
Albumin: 3.3 g/dL — ABNORMAL LOW (ref 3.5–5.2)
Alkaline Phosphatase: 77 U/L (ref 39–117)
BUN: 9 mg/dL (ref 6–23)
CO2: 30 meq/L (ref 19–32)
Calcium: 8.4 mg/dL (ref 8.4–10.5)
Chloride: 100 meq/L (ref 96–112)
Creatinine, Ser: 0.66 mg/dL (ref 0.40–1.20)
GFR: 81 mL/min (ref >60.00)
Glucose, Bld: 80 mg/dL (ref 70–99)
Potassium: 3.8 meq/L (ref 3.5–5.1)
Sodium: 137 meq/L (ref 135–145)
Total Bilirubin: 0.4 mg/dL (ref 0.2–1.2)
Total Protein: 5.9 g/dL — ABNORMAL LOW (ref 6.0–8.3)

## 2023-08-26 LAB — BRAIN NATRIURETIC PEPTIDE: Pro B Natriuretic peptide (BNP): 60 pg/mL (ref 0.0–100.0)

## 2023-08-26 NOTE — Progress Notes (Signed)
Brief patient profile:  3 yowf  never smoker with allergies/inhalers as child outgrew by [employer] then  RA since around 2000  Prednisone  x decades and prev eval by Dr Delford Field around 2004 for sob resolved s maint rx and referred 05/26/2013 by Dr Renne Crigler for bronchitis and abn cxr   History of Present Illness  05/26/2013 1st [facility] office visit/ Wert cc June 2014 dx pna  In [place] and remicade stopped and 100% better and placed arencia in September 2014  then abruptly worse first week in November with cough green sputum s nasal symptoms, fever low grade and no cp or cough and completely recovered prior to OV does not recall abx but issue is why keeps getting sick and abn CT Chest (see below).   Arthritis symptoms well controlled at present on Rx for RA rec Nexium 40 mg Take 30-60 min before first meal of the day and add pepcid 20 mg one at bedtime whenever coughing.     10/19/2017  f/u ov/Wert re:  RA lung dz  Chief Complaint  Patient presents with   Follow-up    Cough is much improved, but has not resolved yet. Cough is non prod. She has not had to use her neb.   Dyspnea:  Not limited by breathing from desired activities  But some doe x steps Cough: daytime > noct dry  Sleep: fine  SABA use:  No saba Medrol 4 mg a/w 2mg  per day/ ok control of arthritis  rec Start back on gabapentin up to 300 mg each am  in addition to the the two at bedtime  If not better increase the medrol to 8 mg daily until bettter then taper back to where you      01/10/2018  f/u ov/Wert re:  RA  Lung dz Medrol  4 mg  One alternating with a half Chief Complaint  Patient presents with   Follow-up    PFT's done. Her breathing has been gradually worsening since the last visit. She has occ cough- non prod.   Dyspnea gradually worse since last ov:  MMRC1 =  MMRC3 = can't walk 100 yards even at a slow pace at a flat grade s stopping due to sob    Gradually x 3 m / more fatigue / no change in  arthritis  Cough: not an issue rec Protonix 40 mg Take 30-60 min before first meal of the day  GERD diet   01/17/2018 acute extended ov/Wert re: cough on medrol 4 mg  One a/w one half  Chief Complaint  Patient presents with   Acute Visit    started coughing 01/11/18- occ prod with minimal green sputum.  She states also wheezing and having increased SOB.    abruptly worse 01/11/18 with severe 24/7 coughing >>  prod min green mucus esp in am/ assoc with subjective wheeze and did not follow previous contingencies re flutter / saba/ increase medrol and admits she does not rember those written instructions nor how to use the neb provided .  No fever/ comfortable at rest sitting  rec For cough > mucinex dm 1200 mg every 12 hours and cough into the flutter valve as much as possible  Doxycycline 100 mg twice daily x 10 days with glass of water Medrol 4mg  x 2 now and take 2  daily until cough is better then 1 daily x 5 days and then resume the previous dose  Shortness of breath/ wheezing/ still  coughing > albuterol neb every 4 hours as needed      01/20/2018 acute extended ov/Wert re: refractory cough and sob 01/11/18 Chief Complaint  Patient presents with   Acute Visit    she is not feeling better, coughing , very SOB, wheezing  mucus now clear/scant  on doxy/ neb machine not working (tube would not plug into the side s adequate force and she was not capable of applying it due to RA hands. Cough/ wheeze/ sob 24/7 / flutter not helping/ can't lie down at hs   rec While coughing protonix 40 mg Take 30- 60 min before your first and last meals of the day  Shortness of breath/ wheezing/ still coughing > albuterol neb every 4 hours as needed  Depomedrol 120 mg IM and medrol 32 mg daily x 2 days,  then 16 mg x 3 days,  Then 8 mg x 4 days , then resume the 4 mg daily  For severe cough > tylenol 3# one every 4 hours if needed  Go to ER if condition worsens on above plan       Date of admission: 01/22/2018              Date of discharge: 01/27/2018   History of present illness: As per the H and P dictated on admission, " Stacey [name]  is a 83 y.o. female, w Rheumatoid arthritis, ILD Asthma, apparently c/o increase in dyspnea this evening. Dry cough.   Denies fever, chills, cp, palp,  N/v, diarrhea, brbpr, black stool.   Pt notes recently being given steroid injection in office as well as being placed on doxycycline. This might have helped slightly but pt worse  Hospital Course:  Summary of her active problems in the hospital is as following. 1 dyspnea/hypoxemia/ILD Concerned that likely GERD Is causing ILD. Patient with cough.   Patient with complaints of awakening with cough and also with oral intake which is slightly improved since 01/24/2018. assessed by speech therapy and speech therapy raising concern of esophageal component but no signs of aspiration.   2D echo with a EF of 55 to 60% with no wall motion abnormalities, grade 1 diastolic dysfunction.  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 Pulmonary felt that the patient should be on scheduled Reglan. I have placed the patient on scheduled potassium before sleep. Continue steroids on discharge continue Mucinex and Claritin as well as inhalers. Patient will follow-up with pulmonary outpatient   2.  Gastroesophageal reflux disease Continue PPI and H2 blocker.  I changed PPI to [facility].   3.  Rheumatoid arthritis Outpatient follow-up.     4.  Anxiety Continue Effexor.       All other chronic medical condition were stable during the hospitalization.  Patient was ambulatory without any assistance. On the day of the discharge the patient's vitals were stable , and no other acute medical condition were reported by patient. the patient was felt safe to be discharge at home with family.   Consultants: PCCM  Procedures: Echocardiogram       03/21/2018  f/u ov/Wert re:   S/p admit was transiently  better  and downhill since Labor day on medrol 4 mg daily  Last orencia on Sept 4th 2019  Chief Complaint  Patient presents with   Acute Visit    Per patient, she has had a dry cough since July 2019. She has been wheezing as well. Increased fatigue. Body aches. Denies any fever or chills.  Dyspnea:  MMRC4  = sob if tries to leave home or while getting dressed   Cough: harsh/ hacking mostly dry/ has flutter not using    SABA use: not much better with rx   No obvious day to day or daytime variability or assoc excess/ purulent sputum or mucus plugs or hemoptysis or cp or chest tightness, subjective wheeze or overt sinus or hb symptoms.     Also denies any obvious fluctuation of symptoms with weather or environmental changes or other aggravating or alleviating factors except as outlined above   No unusual exposure hx or h/o childhood pna/ asthma or knowledge of premature birth.   INpatient consult 03/26/18 83 year old with rheumatoid arthoritis.At baseline the patient lives at home with her husband and is independent of ADLs.  Has been on many immune suppressants over decades and curently on orencia x 4 year and prednisone. Does not recollect being on bactrim/dapsone. Chart mentions BOOP/MAI in 2001 but she denies this. 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 Got admitted. Rx with steroids. Per Notes - clnical suspicion of  arytenoid inflmmation related wheeze noticed (she also reports asthma NOS). Follolwup with ENT recommended (but not seen one as yet). She also appears to have passed swallow with rec for regular diet with thin liquids but did to have mild eso stricture and reflux during testing . PFT shows 10% FVC decline for first ime. CT chest at this tme (aug 2019) showed new rLL infiltrate.  ECHO July 2019 without evicence of elevated  PASP and saw cards [facility] June 2019 and was considered to have worsenin dyspnea due to [facility] issues (reports stress test at [facility] that was normal but I cannot see it)   She reports after discharge she got better but in last several weeks has deteriorated with cough and dyspnea. There is new hypoxemia (currently RA with nail polish and poor circulation  - 89% pulse ox) needing 2L [place]. Per [facility] improved with steroids and abx. CTA 03/24/2018 => shows that RLL inifltrate has improved . Other chronic ILD changes + and small  Hiatal hernia + witthout change.   Review of lab work does show eosinophilia at time of admision   EVENTS 03/21/18 - IgE -5, blood allegy panel - negative, 03/23/2018 - - admit . HIGH EOS 2300, ESR 48, BNP 89 , HIV neg 9/12- PCT negative, RVP negative 9/14 -  PCT < 0.1, Urine strep - negative, MRSA PCR - positive. IgE - normal 4, Blood allergy panel repeat - negative 03/26/2018 - leading consideration for airway (BO in RA +/- asthma) related flare either due to MRSA bronchitis or clinical suspicion of arytenoid inflmmation +/- GERD relatd flare (she has small hiatal hernia)  +/- ? Dysphagia  up causing mild hypoxemia acute resp failure, wheeze . Allergy and IgE blood work negative thought. Patient reports being better but says she is choking on drinkin water [facility] MD says wheeze improved significantly with steroids.  RN says was down to RA yesterday evening but needed 1L [place] at sleep. Today -Room air at rest 94% and desaturated to 86% walking 60 feet 03/27/2018  - better. Off o2 at rest. STill coughs with water and when lies down.  Husband at bedside. Both requesting ILD clinic followup . Desaturated t 79% walking 90 feet.   OV  04/12/2018  Subjective:  Patient ID: Stacey [name], female , DOB: 10/17/1940 , age 72 y.o. , MRN: [id number] , ADDRESS: [address] [place] [address]   04/12/2018 -   Chief Complaint  Patient presents with   Consult    Pt is a former MW pt.  Pt  denies any current complaints of cough, SOB, or CP but states the cough she originally had ended her up in the hosp 9/11-9/17 with dx acute respiratory failure. Pt does wear 2pulse with exertion and also wears 2L continuous when at home.     HPI Stacey [name] 83 y.o. -presents for follow-up to the ILD clinic.  She is known to have rheumatoid arthritis with ILD changes.  She had been followed by Dr. Sandrea Hughs.  However in July 2019 in September 2019 she has had 2 admissions to the hospital with respiratory distress and hypoxemic respiratory failure.  In the first 1 that seem to be right lower lobe infiltrate and then she improved from it but in the second 1 even though the right lower lobe infiltrates were better she still was hypoxemic.  Acid reflux and dysphagia was considered a possible etiology but she passed swallow study 2 times.  They thought she had some reflux.  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  At the same time it is not clear if the ILD is progressive based on pulmonary function testing below   At this point in time she tells me that she is getting home physical therapy.  Her fatigue is improving but it is not fully resolved.  She was discharged on continuous oxygen which she is using.  However she is feeling less short of breath.  Today in fact when we turned her oxygen off and walked her she did not desaturate and this is a significant improvement.  She is on monthly Orencia through the [facility] rheumatologist Dr. Rexene Agent.  At this point in time she is put the Orencia on hold.  She told me that she is been on Orencia for 4 years and never had a respiratory exacerbation still recently x 2.  Although before going on Orencia she had pneumonia while on Remicade and the Remicade.  In terms of her rheumatoid arthritis she hardly has any pain.  Her joint architecture is fairly well-preserved because of various immunomodulators over  time.  She says that she was on Remicade for years and when she stopped it for 8 weeks before the switch to Orencia she never really had a relapse in her rheumatoid arthritis.  She is largely pain and stiffness free.  She believes she can go without  her Orencia for a while.  Review of the literature shows greater than 10% chance of a respiratory infection especially COPD exacerbation.  Although the time frame for this is unclear.       OV 06/07/2018  Subjective:  Patient ID: Stacey [name], female , DOB: [birth year] , age 89 y.o. , MRN: [id number] , ADDRESS: [address] [place] [address]   06/07/2018 -   Chief Complaint  Patient presents with   Follow-up    ILD, PFT done today, some wheezing and coughing but better tha before   Rheumatoid arthritis ILD and asthma/obstructive lung disease phenotype on Dulera  HPI RAAHI KORBER 83 y.o. -presents for routine follow-up with her husband.  She is here to follow-up with [facility] Dr. Aundria Rud.  She plans to do  this in December 2019.  She continues to be off Orencia.  Her joints are slowly getting stiff again.  She believes that she will need to be back on immunosuppression agent again.  She currently continues Medrol 4 mg alternating with 2 mg.  This for her rheumatoid arthritis.  In terms of her joints she continues on Medrol 4 mg alternating with 2 mg but not on any other immunosuppression agent.  Overall she is been stable but for the last 2 weeks has had green sputum and wheezing and chest congestion and cough.  She recently visited her husband who was hospitalized and walking the long hallways at [facility] made a short of breath but she thinks this is probably baseline for her.  There are no other new issues.  She did have spirometry and DLCO and this shows a decline compared to September 2019 and a similar to July 2019.  It is documented below.  This is probably reflective of a flareup   OV  07/21/2018  Subjective:  Patient ID: Stacey [name], female , DOB: [birth year] , age 42 y.o. , MRN: [id number] , ADDRESS: [address] [place] [address]   07/21/2018 -   Chief Complaint  Patient presents with   Follow-up    Pt states she has been doing well since last visit. States she is about to begin Rituxan with [facility]. Pt still becomes SOB with exertion. Denies any complaints of cough or CP.   Rheumatoid arthritis ILD and asthma/obstructive lung disease phenotype on Dulera  HPI Stacey [name] 83 y.o. -presents for follow-up of the above.  Last seen just before Thanksgiving 2019.  In the interim overall stable although on June 23, 2018 she climbs a steep flight of stairs which is unusual exertion for her and she became very dyspneic.  Following day saw Dr. Aundria Rud at [facility] rheumatology and was given Z-Pak and prednisone and started feeling better.  Although it is not fully clear to me she had fever and bronchitic symptoms.  I reviewed Dr. Aundria Rud note.  Dr. Aundria Rud is decided to start Rituxan for rheumatoid arthritis.  She is only having some minimal joint pain at this point.  She is off Orencia and continues to be off Orencia.  She did have some blood work with [place] before starting Rituxan.  She is due to see Dr. Aundria Rud within the next week and start her Rituxan.  Her liver function test July 18, 2017 is normal hemoglobin is normal.  CRP is also normal.  We did spirometry and walking desaturation test.  These show improvement compared to before and these are documented below.  Currently she not using nighttime or daytime oxygen.  She is wondering if she could switch rheumatology care to [facility].  This is because while she likes [employer] she is getting older and more frail and feels some body local would be of help.  I have sent a message to Dr. Corliss Skains inquiring.  Certainly we can help her with Rituxan infusions at [facility] health system if needed.  She  will check on this with her [facility] rheumatologist.       OV 03/27/2019  Subjective:  Patient ID: Stacey [name], female , DOB: 02/21/1941 , age 47 y.o. , MRN: [id number] , ADDRESS: [address] [place] [address]  Rheumatoid arthritis ILD and asthma/obstructive lung disease phenotype on [facility]   03/27/2019 -  Rourine fu   HPI Stacey [name] 83 y.o. -  presents for the above.  Last seen in January 2020.  After that she has seen Dr. Aundria Rud rheumatology at [facility].  She is now getting Rituxan 2 doses every 6 months.  She says this is helped her joints and her stiffness.  She is a little bit more mobile than usual.  However in terms of her respiratory status she continues to have episodic cough.  In June 2020 she again got hypoxemic and got admitted.  Since then she has had episodic cough.  She had a respiratory exacerbation in June 2020 for the admission she got steroids.  This seemed to help.  She is also on a higher dose Dulera right now.  In terms of her cough this seems to be her biggest problem.  She seems to be on Dulera, Singulair scheduled with also Tessalon and Delsym and DuoNeb.  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  Her last high-resolution CT scan of the chest was 1 year ago.  She says the dyspnea itself is not worse.  She is currently not using oxygen.  On exam she did have some wheezing.  Currently she has white and brown sputum but this is baseline.  In terms of a COVID wrist she has been tested recently couple of times and this is been negative.  She is isolating well.  She wanted to know COVID prevention activities and risk status and masking strategies.      OV 05/24/2019  Subjective:  Patient ID: Stacey [name], female , DOB: [birth year] , age 17 y.o. , MRN: [id number] , ADDRESS: [address] [place] [address]   05/24/2019 -   Chief  Complaint  Patient presents with   Follow-up    Pt was recently in the hosp due to ILD. Pt states that she has been better since being out of the hosp.   Rheumatoid arthritis ILD and asthma/obstructive lung disease phenotype on Dulera/Singulair  Post herpetic neuralgia - on elavil, gabapentin, effexor  HPI Stacey [name] 83 y.o. -returns for follow-up.  At the last visit approximately 2 months ago she was reporting worsening cough following an admission in summer 2020.  Her pulmonary function test suggested worsening ILD status.  Therefore we requested a high-resolution CT chest which she did in October 2020.  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  However in the interim after the CT scan was done towards the end of October 2020 she developed worsening of her cough over 2 weeks and also associated shortness of breath but significantly the cough is much worse.  She ended up getting admitted to the hospital.  There was some hypoxemia.  By this time she had finished a ENT evaluation that did not show any involvement of the arytenoids.  Pulmonary was consulted.  She was given a prednisone burst which she just finished I believe yesterday.  She is back on her baseline Medrol.  And she is feeling better.  Her oxygen status is improved although she is using oxygen at night now.  She is really frustrated with these recurrent flareups and these admissions which ended up with her having a wheeze and also hypoxemia.  Currently she is on her baseline Medrol for rheumatoid arthritis associated with Dulera and Singulair.  She is also on losartan for blood pressure.  Her walking desaturation test is slightly worse  than baseline.  She has a GI consult pending because of the recurrent episodes of cough and flareups.  We went over exposure history.  We used interstitial lung disease questionnaire for the exposure history.  Specifically she denies any electronic cigarette use of  marijuana use of cocaine use or any IV drug abuse.  [place] the last 14 years.  Asked extensive questions about the home environment it is positive for nebulizer use but the nebulizer does not have mildew or mold in it.  Otherwise no organic antigen exposure.  The house is not damp.  There is no mold or mildew in the shower curtain.  There is no humidifier use no steam iron use.  No Jacuzzi use.  No misting [place] outside to inside the house.  No pet birds.  No pet gerbils no feather pillows.  There is no mold in the [facility] duct.  She does not do any gardening.  Does not use wind instruments.  Also 122 question occupational history elicited and essentially negative.  The other issue is that she has polypharmacy.  She is asking for my help in reducing her medications.  She is on 3 medications for postherpetic neuralgia.  She is on losartan      OV 06/21/2019  Subjective:  Patient ID: Stacey [name], female , DOB: 06/20/1941 , age 36 y.o. , MRN: [id number] , ADDRESS: [address] [place] [address]   06/21/2019 -   Chief Complaint  Patient presents with   televisit    hosp 11/29-12/4 due to covid with pna. pt said that she is doing okay after recent hosp but states she has no energy.      Stacey [name] 83 y.o. - last visit 05/24/2019. Diagnosed with covid-19 on 05/31/2019. Admitted 06/11/2019  - 06/16/2019. Quarantine ends 06/21/2019 today. Rx with Remdesviri and Decadron. Husband also on phone. Questions  1. Quarantine ends - from 06/22/19\ and she is not contagious and likely resistant to reinfection to covid for another few months  2. She is on 10 day dexamethasone for covid - today is last day for it  3. She is on dulera. Hospital gave combivent and she does not want do combivent - this is fine  4. Ofev for ILD - not started it yet . She had questions about side effects. Explained GI and LFT monirtong. SHe wanted to  wait till Jan 2021 and start it . I am ok with that.   5. Small hiat   IMPRESSION: HRCT OCt 2020 1. 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 Findings have progressed since 2017 and 2019 high-resolution chest CT studies. 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 Findings are consistent with UIP per consensus guidelines: Diagnosis of Idiopathic Pulmonary Fibrosis: An Official ATS/ERS/JRS/ALAT Clinical Practice Guideline. Am Rosezetta Schlatter Crit Care Med Vol 198, Iss 5, [phone], Mar 13 2017. 2. 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 4. Small hiatal hernia.   Aortic Atherosclerosis (ICD10-I70.0).     Electronically Signed   By: Delbert Phenix M.D.   On: 04/24/2019 13:29    OV 08/03/2019 - face to face visit  Subjective:  Patient ID: Stacey [name], female , DOB: 04-Apr-1941 , age 56 y.o. , MRN: [id number] , ADDRESS: [address] [place] [address]    Rheumatoid arthritis with Joint  s/ILD- Rituxan/steroids ([facility])  - progressive phenotype (CT 2017/2019 -> Oct 2020). Planned Ofev start in Nov 2020 but did not start as of dec 2020. LFT normal Jan 2021  Athma/obstructive lung disease phenotype on Dulera/Singulair  Post herpetic neuralgia - on elavil, gabapentin, effexor  Coronary Artery Calcification - 1 vessel - cardiology referral done  Diagnosed with covid-19 on 05/31/2019. Admitted 06/11/2019  - 06/16/2019. Rx with Remdesviri and Decadron.  Small Hiatal hernia onCT Oct 2020  Rib fracture from fall Dec 2020: Musculoskeletal: There is nondisplaced fractures of the posterior left fifth, 6, 8, 9, and tenth left ribs. There is unchanged slight superior compression deformities of the T5, T6, T10 vertebral bodies with less than 25% loss in height.Associated pulmnary contusion  +    HPI Stacey [name] 83 y.o. -presents for a visit. Her nintedanib has been delayed because of COVID-19. Also subsequently a fall. She saw a nurse practitioner on July 20, 2019 and they took a shared decision to start nintedanib. She had a ? telephone visit with Dr. Kennieth Francois the rheumatologist at [facility]. Patient scheduled for her next Rituxan in February 2021 but reviewed the notes indicates this could be pushed to early spring 2021. Dr. Aundria Rud is okay with the patient study got intubated at this point in time.  Patient has follow-up with Dr. Aundria Rud tomorrow at [facility].  At this face-to-face visit she is here with her husband.  She says she is much better after the Covid and also the fall that fractured her ribs.  Nevertheless all this is left her fatigue.  Infective fatigue scores are much worse.  Also in the last 2 weeks has had increased cough wheezing and shortness of breath.  The sputum was also changed color in the last 1 week to clear green.  This is all new.  Her symptom score is therefore a worse than her baseline.  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  She is only taking 100 mg once a day.  The plan was to go up to 100 mg twice a day next week.  This is a minimum effective dose.  The maximum dose is 150 mg twice a day.  Given her age and comorbidities we are starting at a low dose.  Part of this visit is to make sure that she is tolerating the drug fine.  And so far she is.  She is interested in the ILD-pro registry study done by the [facility] clinical research Institute.   She is working with physical therapy for her fatigue.  Ambulatory Walk 07/20/2019 2 Lap- O2 92% RA; HR 109 No shortness of breath She did not need to stop Used walker    OV 09/20/2019  Subjective:  Patient ID: Stacey [name], female , DOB: Feb 25, 1941 , age 13 y.o. , MRN: [id number] , ADDRESS: [address] [place]  [place] [address]   09/20/2019 -   Chief Complaint  Patient presents with   Televisit    Called and spoke with pt who stated she has been feeling okay since last visit. Pt stated she is still taking OFEV and denies any complaints. Pt states she believes her breathing is stable at this point.    Rheumatoid arthritis with Joint s/ILD- Rituxan/steroids (duke uinviesty)  - progressive phenotype (CT 2017/2019 -> Oct 2020). Planned Ofev start in Nov 2020 but did not start as of dec 2020.  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 Admitted 06/11/2019  - 06/16/2019. Rx with Remdesviri and Decadron.  Small Hiatal hernia on CT Oct 2020  Rib fracture from fall Dec 2020: Musculoskeletal: There is nondisplaced fractures of the posterior left fifth, 6, 8, 9, and tenth left ribs. There is unchanged slight superior compression deformities of the T5, T6, T10 vertebral bodies with less than 25% loss in height.Associated pulmnary contusion +   HPI Stacey [name] 83 y.o. - has 2nd covid vaccine later this week. Rituxan is end of the month. Baseline RA regiment is On dulera for associated obstructio of lung. Continue ofev 100mg  bid since mid jan 2021 for ILD. No side effects. Did see Buelah Manis for face to face visit early feb 2021 -> for bronchitis and felt better after prdnisone and zpak. Currently on medrol   8 mg day and staying there per Buelah Manis . Wants t oknow if she can taper. Wants to know how she can prevent care flare ups. Explained ofev, masking and social distancing  prevent respiratory infection.Denies choking on food or aspirating. Denies mold in house - relatively news. Denies dog. Denies cat. Denies carious teeth. Denies post nasal drip  OVerll feels better and improved dyspnea.     OV 12/18/2019  Subjective:  Patient ID: Stacey [name], female , DOB: [birth year] , age 17 y.o. , MRN: [id number] , ADDRESS: [address] [place] [address]  PCP [name], MD Rheumatologist-Dr. Rexene Agent at [facility] Pulmonary/ILD: Dr. Jeni Salles  12/18/2019 -   Chief Complaint  Patient presents with   Follow-up    no worse   Rheumatoid arthritis with Joint s/ILD- Rituxan/steroids (duke uinviesty)  - progressive phenotype (CT 2017/2019 -> Oct 2020). Planned Ofev start in Nov 2020 but did not start as of dec 2020. 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 Admitted 06/11/2019  - 06/16/2019. Rx with Remdesviri and Decadron.  Small Hiatal hernia on CT Oct 2020  Rib fracture from fall Dec 2020: Musculoskeletal: There is nondisplaced fractures of the posterior left fifth, 6, 8, 9, and tenth left ribs. There is unchanged slight superior compression deformities of the T5, T6, T10 vertebral bodies with less than 25% loss in height.Associated pulmnary contusion +  -On Reclast once a year through Dr. Rexene Agent   HPI Stacey [name] 83 y.o. 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  It has been a few month since I last saw her.  In this time she did not taper her Medrol down.   She stated 8 mg/day.  She was originally taking the Medrol for her rheumatoid arthritis for many years.  She recently increased the dose from 4 mg / 2 mg every other day to 8 mg daily because of recurrent respiratory exacerbations.  She is asking for [facility] refills for obstructive lung disease.  She is getting Rituxan through Dr. Rexene Agent for rheumatoid arthritis.  Her next Rituxan is in September 2021.  She saw Dr. Aundria Rud in April 2021 and reviewed the note.  Her liver function test at the time was fine.  In terms of her ILD she continues on nintedanib 100 mg twice daily.  She says she has no side effects from the drug she is tolerating it quite well.  We discussed about increasing the dose but she wants to hold off because she just got a new supply.  However she is open to increasing the dose when the supply runs out.  She is due for liver function test today.  In terms of overall symptoms things have improved as can be seen below and the symptom score.Marland Kitchen  However her main concern is that of fatigue.  She is open to attending pulmonary rehabilitation.  She is not using oxygen at home.  Sometime back when we tested overnight oxygen desaturation test she did not desaturate.  Walking desaturation test today stable.  She does notice of note that her blood pressure has gone up.  She is working with her primary care physician on this.  She is on losartan.    OV 04/10/2020   Subjective:  Patient ID: Stacey [name], female , DOB: 08-14-1940, age 55 y.o. years. , MRN: [id number],  ADDRESS: [address] [place] [address] PCP  [name], MD Providers : Treatment Team:  Attending Provider: Kalman Shan, MD   Chief Complaint  Patient presents with   Follow-up    CT scan 8/23, has not increased Ofev, shortness of breath with exertion.    Rheumatoid arthritis with Joint s/ILD- Rituxan/steroids (duke uinviesty)  - progressive phenotype (CT 2017/2019 -> Oct 2020). Planned Ofev  start in Nov 2020 but did not start as of dec 2020. 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 Admitted 06/11/2019  - 06/16/2019. Rx with Remdesviri and Decadron.  Admitted August 2021 for respiratory infection not otherwise specified.  Following trip to [place].  Was hypoxemic.  Small Hiatal hernia on CT Oct 2020  Rib fracture from fall Dec 2020: Musculoskeletal: There is nondisplaced fractures of the posterior left fifth, 6, 8, 9, and tenth left ribs. There is unchanged slight superior compression deformities of the T5, T6, T10 vertebral bodies with less than 25% loss in height.Associated pulmnary contusion +  -On Reclast once a year through Dr. Rexene Agent    HPI Stacey [name] 83 y.o. -returns for follow-up.  Last week by myself early June 2021.  Then after that in early August 2020 when she got admitted for respiratory infection not otherwise specified.  She was hypoxemic  on room air.  This followed a trip to [place].  In the follow-up phase end of August 2020 when she had high-resolution CT chest that showed increased groundglass opacities but when compared to a CT from 8 months ago.  Also chronic ILD had worsened.  I personally visualized the image at this point in time she is improved from this admission she is not using oxygen at all.  In fact walking desaturation test shows near baseline.  Nevertheless overall her symptom scores have declined over time ILD symptom score is listed  below.  She is really concerned about several issues.  In terms of her ILD she was inquiring about escalating to 150 mg twice daily.  Even at 100 mg twice daily she is significantly symptomatic in terms of nonrespiratory issues such as nausea and dizziness and fatigue.  I did caution her that these could all get worse.  Therefore she is just opted to be at 100 mg twice daily  -Recurrent admissions for respiratory issues [I did address that some of this was Covid and fall related and not necessarily her usual summer exacerbation].  Nevertheless, I asked about exposures at home.  She does not have a feather blanket or feather pillow or feather jacket.  She denies any mold or bird exposure or mildew exposure at home.  Does not do any gardening or wind instruments.  She is immunosuppressed and I did notice that she is not on Bactrim for PCP prophylaxis.  In 2019 when we checked G6PD the lab could not process this lab.  She is interested in Bactrim prophylaxis.  She is interested in okay getting G6PD rechecked   -She is dealing with significant amount of dizziness.  Neurology is sending her to neuro rehab.  It is believed it is multifactorial.  I reviewed her recent CT head angio report.  I personally visualized the image   -She is also worried about cough.  Currently cough is tolerable but she says prior to her exacerbations cough gets worse.  We did discuss with her that if she is aspirating which she denied.  We did discuss about the possibility of starting Bactrim and monitoring her cough and she is fine with that.  I did review old records Lungs/Pleura: Biapical pleuroparenchymal scarring. Patchy and largely peripheral peribronchovascular ground-glass, increased from 07/03/2019. Underlying subpleural reticulation, traction bronchiectasis/bronchiolectasis and scattered honeycombing, similar to minimally increased from 07/03/2019. Calcified granulomas. No pleural fluid. Airway is unremarkable. No air  trapping.   Upper Abdomen: Visualized portions of the liver, adrenal glands, kidneys, spleen, pancreas, stomach and bowel are unremarkable with the exception of a small hiatal hernia. Cholecystectomy. Calcified upper abdominal lymph nodes.   Musculoskeletal: Degenerative changes in the spine. No worrisome lytic or sclerotic lesions.   IMPRESSION: 1. Increased patchy pulmonary parenchymal ground-glass may be due to an atypical/viral pneumonia, including due to COVID-19. 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 2. Aortic atherosclerosis (ICD10-I70.0). Coronary artery calcification.     Electronically Signed   By: Leanna Battles M.D.   On: 03/04/2020 12:58    OV 08/01/2020  Subjective:  Patient ID: Stacey [name], female , DOB: March 13, 1941 , age 56 y.o. , MRN: [id number] , ADDRESS: [address] [place] [address] PCP [name], MD Patient Care Team: [name], MD as PCP - General (Internal Medicine) Jodelle Red, MD as PCP - Cardiology (  Cardiology)  This Provider for this visit: Treatment Team:  Attending Provider: Kalman Shan, MD   Rheumatoid arthritis with Joint s/ILD- Rituxan/steroids (duke uinviesty)  - progressive phenotype (CT 2017/2019 -> Oct 2020). Planned Ofev start in Nov 2020 but did not start as of dec 2020. 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 Admitted 06/11/2019  - 06/16/2019. Rx with Remdesviri and Decadron.  Admitted August 2021 for respiratory infection not otherwise specified.  Following trip to [place].  Was hypoxemic.  Small Hiatal hernia on CT Oct 2020  Rib fracture from fall Dec 2020: Musculoskeletal: There is nondisplaced fractures of the posterior left fifth, 6, 8, 9, and tenth left ribs. There is unchanged slight superior compression deformities of the T5, T6, T10 vertebral bodies with less than 25% loss in height.Associated pulmnary contusion +  -On Reclast once a year through Dr. Rexene Agent    08/01/2020 -   Chief Complaint  Patient presents with   Follow-up    Doing well     HPI Stacey [name] 83 y.o. -returns for follow-up.  She says since last visit she has lost weight.  In fact tracking her weight it appears she is definitely lost weight.  She has occasional intermittent nausea once every few weeks this is because she does not time her nintedanib well with food.  She has a low appetite as well.  She tried to go up on the Internet but she is unable to.  She takes a low-dose 100 mg twice daily.  She is on Rituxan and prednisone.  She is up-to-date with her COVID-vaccine.  Current shortness of breath standpoint she is stable.  Symptoms are stable.  CT Chest data  OV 10/22/2020  Subjective:  Patient ID: Stacey [name], female , DOB: 1940-08-21 , age 83 y.o. , MRN: [id number] , ADDRESS: [address] [place] [address] PCP [name], MD Patient Care Team: [name], MD as PCP - General (Internal Medicine) Jodelle Red, MD as PCP - Cardiology (Cardiology)  This Provider for this visit: Treatment Team:  Attending Provider: Kalman Shan, MD    10/22/2020 -   Chief Complaint  Patient presents with   Follow-up    Still having shortness of breath with activity, denies  increase in severity.     Rheumatoid arthritis with Joint s/ILD- Rituxan/steroids (duke uinviesty)  - progressive phenotype (CT 2017/2019 -> Oct 2020). Planned Ofev start in Nov 2020 but did not start as of dec 2020. 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 Admitted 06/11/2019  - 06/16/2019. Rx with Remdesviri and Decadron.  - sp evushed x 2  Admitted August 2021 for respiratory infection not otherwise specified.  Following trip to [place].  Was hypoxemic.  Small Hiatal hernia on CT Oct 2020  Rib fracture from fall Dec 2020: Musculoskeletal: There is nondisplaced fractures of the posterior left fifth, 6, 8, 9, and tenth left ribs. There is unchanged slight superior compression deformities of the T5, T6, T10 vertebral bodies with less than 25% loss in height.Associated pulmnary contusion +  -On Reclast once a year through Dr. Rexene Agent  Normal ECHO Summer 2020   HPI Stacey CRIBB 83 y.o. -returns for follow-up.  At this point in time for her ILD she is taking nintedanib 100 mg twice daily.  Last liver function test was in December 2021.  She is now attending pulmonary rehabilitation.  She is not sure it is helping her.  She tells me that she does not desaturate at pulmonary rehabilitation so she not using her oxygen.  Review of the records indicate same but it appears that today her dyspnea is worse in terms of symptom score although she is denying that.  Walking desaturation  test in the office today with a forehead probe suggest that pulse ox is declined.  Her pulmonary function test also shows 5% FVC decline.  She was surprised by this.  In talking to her realize that for coexistent obstructive lung disease she is no longer taking Dulera.  She is willing to take inhaler which she will have to twist instead of having to pump with her fingers because she has rheumatoid arthritis.  She is no longer losing weight though.  She had questions about taking monoclonal antibody prophylaxis.  She is under the impression she needs to take it every 6 months with Rituxan dosing.  I did explain to her that in the presence of Rituxan the body is not able to respond actively to vaccine and that is why she is having prophylactic antibody.  She is now had 2 doses the last 1 being a few weeks ago first 1 was in January 2022.  I have written to the [facility] coordinator Lillard Anes 26 monoclonal antibody prophylaxis schedule.  Expressed to patient that because of monoclonal antibody prophylaxis she did not do the vaccine.  Last echocardiogram was in summer 2020.      OV 12/23/2020  Subjective:  Patient ID: Stacey [name], female , DOB: 01-May-1941 , age 40 y.o. , MRN: [id number] , ADDRESS: [address] [place] [address] PCP [name], MD Patient Care Team: [name], MD as PCP - General (Internal Medicine) Jodelle Red, MD as PCP - Cardiology (Cardiology)  This Provider for this visit: Treatment Team:  Attending Provider: Kalman Shan, MD   12/23/2020 -   Chief Complaint  Patient presents with   Follow-up    PFT performed today. Pt states she has been doing okay since last visit and denies any complaints.     HPI TAETUM FLEWELLEN 83 y.o. -returns for follow-up.  There is concern for progression.  Last visit we have made sure she added Breo.  She says she feels better.  In fact symptom score is better.  However pulmonary function test shows  continued decline.  She is on the low-dose nintedanib.  She says if she does not take the nintedanib exactly with food and  even if she takes it 15 to 20 minutes later she will have nausea.  Today she had nausea but otherwise if she is diligent she can maintain.  I informed her the pulmonary function test shows continued decline.  She is on Rituxan prednisone and low-dose nintedanib.  She initially said she will not be able to tolerate the higher dose nintedanib but after realizing that her nausea is mild and her weight loss is stable she says she is willing to give the higher dose nintedanib and attend.  I was able to get her some donor sample of nintedanib.  The lot number of this is [id number].  Expiration date is November 2023.  The serial number is [id number].  The GT IN number is [id number]       ECHO 11/3120  IMPRESSIONS     1. Left ventricular ejection fraction, by estimation, is 65 to 70%. The  left ventricle has normal function. The left ventricle has no regional  wall motion abnormalities. Left ventricular diastolic parameters are  consistent with Grade I diastolic  dysfunction (impaired relaxation). Elevated left ventricular end-diastolic  pressure. The E/e' is 46. The average left ventricular global longitudinal  strain is -18.4 %.   2. Right ventricular systolic function is normal. The right ventricular  size is normal. There is mildly elevated pulmonary artery systolic  pressure. The estimated right ventricular systolic pressure is 36.9 mmHg.   3. The mitral valve is abnormal. Mild mitral valve regurgitation.   4. The aortic valve is tricuspid. Aortic valve regurgitation is not  visualized. Mild aortic valve sclerosis is present, with no evidence of  aortic valve stenosis.   5. The inferior vena cava is normal in size with greater than 50%  respiratory variability, suggesting right atrial pressure of 3 mmHg.   Comparison(s): Changes from prior study are noted. 12/18/18  EF 60-65%. PA  pressure .    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  OV 03/10/2021  Subjective:  Patient ID: Stacey [name], female , DOB: 1940/10/16 , age 30 y.o. , MRN: [id number] , ADDRESS: [address] [place] [address] PCP [name], MD Patient Care Team: [name], MD as PCP - General (Internal Medicine) Jodelle Red, MD as PCP - Cardiology (Cardiology)  This Provider for this visit: Treatment Team:  Attending Provider: Kalman Shan, MD    03/10/2021 -   Chief Complaint  Patient presents with   Follow-up    Pt states she has questions about ofev she currently on it but having side effects.    Rheumatoid arthritis with Joint s/ILD- Rituxan/steroids (duke uinviesty)  - progressive phenotype (CT 2017/2019 -> Oct 2020). Planned Ofev start in Nov 2020 but did not start as of dec 2020. 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 Admitted 06/11/2019  - 06/16/2019. Rx with Remdesviri and Decadron.  - sp evushed x 2  - 2nd covid by hx June 2022 - evusheld repeat Oct 2022  Admitted August  2021 for respiratory infection not otherwise specified.  Following trip to [place].  Was hypoxemic.  Small Hiatal hernia on CT Oct 2020  Rib fracture from fall Dec 2020: Musculoskeletal: There is nondisplaced fractures of the posterior left fifth, 6, 8, 9, and tenth left ribs. There is unchanged slight superior compression deformities of the T5, T6, T10 vertebral bodies with less than 25% loss in height.Associated pulmnary contusion +  -On Reclast once a year through Dr. 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  -returns for follow-up.  She presents with her husband Park Breed.  Last visit she wanted to increase the nintedanib to 150 mg twice daily but this is made her have more nausea and vomiting.  The diarrhea is very mild.  She then reduced her nintedanib 200 mg twice daily but still having side effects.  She is now started losing weight again.  She is really frustrated by this.  Her husband asked about pirfenidone.  Explained that the research with progressive pulmonary fibrosis is limited in the case of pirfenidone so nintedanib is the first choice.  Did agree that if nintedanib did not work we would try pirfenidone.  She wanted know what her options were.  We discussed about stopping nintedanib and improving quality of life but face the risk of continued progression pulmonary fibrosis.  She was not that enthused about this choice.  We discussed about trying Zofran with the nintedanib.  She seemed more receptive to this idea.  We decided that we will try with 100 mg twice daily at the lower dose and then see.  She believes asthma is under control.  Her pulmonary function test shown below seems to fluctuate.  Overall symptom severity stable.  She wanted a handicap placard signed today which I did.       OV 04/08/2021  Subjective:  Patient ID: Stacey [name], female , DOB: [birth year] , age 46 y.o. , MRN: [id number] ,  ADDRESS: [address] [place] [address] PCP [name], MD Patient Care Team: [name], MD as PCP - General (Internal Medicine) Jodelle Red, MD as PCP - Cardiology (Cardiology)  This Provider for this visit: Treatment Team:  Attending Provider: Kalman Shan, MD    04/08/2021 -   Chief Complaint  Patient presents with   Follow-up    Pt states she has been doing okay since last visit and denies any real complaints. States breathing has been doing okay and states the nausea went away.    Rheumatoid arthritis with Joint s/ILD- Rituxan/steroids ([facility])  - progressive phenotype (CT 2017/2019 -> Oct 2020). Planned Ofev start in Nov 2020 but did not start as of dec 2020. LFT normal Jan 2021   - Rituxan via [facility] -September 2021 + Medrol 4mg /2mg  QOD baseline many decades (on 8mg  per day since feb 2021 due to recurrent asthma flares)  - nintedanib for her ILD she started it on July 31, 2019 - started 100mg  twice daily (started low dose du eto side effect concern) -> increased to 150mg  bid d June 2022 -> reduced to 100mg  bid aug 2022 due tow  eight loss  - ILD PRO registry study - since 08/16/19  Athma/obstructive lung disease phenotype on Dulera/Singulair  Post herpetic neuralgia - on elavil, gabapentin, effexor  Coronary Artery Calcification - 1 vessel -  S/p - covid-19 on 05/31/2019. Admitted 06/11/2019  - 06/16/2019. Rx with Remdesviri and Decadron.  - sp evushed x 2  Admitted August 2021 for respiratory infection not otherwise specified.  Following trip to [place].  Was hypoxemic.  Small Hiatal hernia on CT Oct 2020  Rib fracture from fall Dec 2020: Musculoskeletal: There is nondisplaced fractures of the posterior left fifth, 6, 8, 9, and tenth left ribs. There is unchanged slight superior compression deformities of the T5, T6, T10 vertebral bodies with less than 25% loss in height.Associated pulmnary contusion +  -On Reclast  once a year through Dr. Rexene Agent  Normal ECHO Summer 2020 -> possible [facility] May 2021 RSVP 30s with GR1 ddx  HPI ASMA BOLDON 83 y.o. 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  Last seen 4 weeks ago.  At that time she was losing weight and having significant GI side effects with nintedanib full dose.  Told her to reduce the nintedanib dose to 100 mg twice daily and also take Zofran as needed.  She is taking Zofran as needed.  She is tolerating the nintedanib low-dose much better.  Symptom scores are listed below.  Overall she feels better.  Weight loss is stabilized.  Infectious gained 1 pound of weight.  She feels that she will just take with nintedanib 100 mg twice daily using Zofran as needed.  She does not want to change the schedule.  I fully agree with her and supported on that.  She is aware that the low-dose is less effective but this might be best balance between risk and benefit.  She did ask about getting a COVID by Valent mRNA booster 2 oh micron and BA.5.  According to history she has had COVID x2.  Most recently was in June 2022 which would be against the current viral strain.  In addition she is got monoclonal antibody prophylaxis euvsheld in past.  She does socially isolate herself to the extent possible and masks.  She is going to get the monoclonal antibody prophylaxis again in a few weeks when she gets her Rituxan.  We did discuss the fact that because she is on Rituxan she is immunosuppressed and does not make antibodies and then the facet antibody transfer that she gets through [facility] is risk protective.  Additional risk protective feature is the fact that she had a current strain of COVID-19 and potentially has natural immunity.  Therefore it is her call to get the bivaet booster although she wanted to avoid it and I supported this in a shared decision making she could hold off till at least January 2023 when it  will be 6 months since her natural infection.     OV 07/18/2021  Subjective:  Patient ID: Stacey [name], female , DOB: August 30, 1940 , age 31 y.o. , MRN: [id number] , ADDRESS: [address] [place] [address] PCP [name], MD Patient Care Team: [name], MD as PCP - General (Internal Medicine) Jodelle Red, MD as PCP - Cardiology (Cardiology)  This Provider for this visit: Treatment Team:  Attending Provider: Kalman Shan, MD    07/18/2021 -   Chief Complaint  Patient presents with   Follow-up    Pt states she has been doing okay  since last visit. States her breathing is about the same.    Rheumatoid arthritis with Joint s/ILD- Rituxan/steroids ([facility])  - progressive phenotype (CT 2017/2019 -> Oct 2020). Planned Ofev start in Nov 2020 but did not start as of dec 2020. 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 Admitted 06/11/2019  - 06/16/2019. Rx with Remdesviri and Decadron.  - sp evushed x 2  Admitted August 2021 for respiratory infection not otherwise specified.  Following trip to [place].  Was hypoxemic.  Small Hiatal hernia on CT Oct 2020  Rib fracture from fall Dec 2020: Musculoskeletal: There is nondisplaced fractures of the posterior left fifth, 6, 8, 9, and tenth left ribs. There is unchanged slight superior compression deformities of the  T5, T6, T10 vertebral bodies with less than 25% loss in height.Associated pulmnary contusion +  -On Reclast once a year through Dr. Rexene Agent  Normal ECHO Summer 2020 -> possible [facility] May 2021 RSVP 30s with GR1 ddx   xxxx HPI Stacey [name] 83 y.o. -presents for follow-up.  Last visit September 2022.  Since then she is doing stable.  With a lower dose of nintedanib no more diarrhea.  Weight loss is stopped.  Barely any nausea.  She is not needing Zofran for nausea control.  Her main concern right now is that she is having imbalance issues.  She is stopped working with the physical therapy.  She thinks it is 1 of proprioception but also long-term muscle loss and sarcopenia.  I have advised her to take this with her primary care/rheumatology.  Terms of asthma is stable on Breo and Singulair.  Last liver function test April 2022 in our chart.  Latest pulmonary function test shows stability    CT Chest data  No results found.            OV 03/06/2022  Subjective:  Patient ID: Stacey [name], female , DOB: 1940/10/29 , age 53 y.o. , MRN: [id number] , ADDRESS: [address] [place] [address] PCP [name], MD Patient Care Team: [name], MD as PCP - General (Internal Medicine) Jodelle Red, MD as PCP - Cardiology (Cardiology)  This Provider for this visit: Treatment Team:  Attending Provider: Kalman Shan, MD    03/06/2022 -   Chief Complaint  Patient presents with   Follow-up    PFT performed today.  Pt states she has been doing okay since last visit. States her breathing has been doing okay but states she has been having some problems with balance.    HPI Stacey [name] 83 y.o. -returns for follow-up.  Last seen in January 2023.  She says that from a breathing standpoint she is stable but she has had balance issues.  She has had 2 accidental falls.  The first 1 was in March 2023 while entering the house along the  steps and she  landed in the bushes and had 25 stitches to her left lower extremity.  She also had collarbone fracture.  Then again a fall in the driveway 2 months ago but with just bruising.  She is now going to undergo physical therapy.  ENT evaluation did not show any ENT causes for the fall.  In terms of her dyspnea and walking desaturation test in the office she is stable but her pulmonary function test shows continued decline.  She is on nintedanib at the lower dose because she is not able to tolerate the higher dose.  On this lower dose she still requiring Zofran for control of nausea.  She takes Zofran preemptively.  There are no other issues.  The last echo and CT scan was over a year ago.     OV 12/11/2022  Subjective:  Patient ID: Stacey [name], female , DOB: 07-01-1941 , age 53 y.o. , MRN: [id number] , ADDRESS: [place] [address] [place] [address] PCP Eloisa Northern, MD Patient Care Team: Eloisa Northern, MD as PCP - General (Internal Medicine) Jodelle Red, MD as PCP - Cardiology (Cardiology)  This Provider for this visit: Treatment Team:  Attending Provider: Kalman Shan, MD    12/11/2022 -   Chief Complaint  Patient presents with   Follow-up    F/up, in the hosp. 3 weeks ago, and wants to know why she was in the hosp.     HPI ADALENA ABDULLA 83 y.o. -was last seen in August 2023.  After that her husband has passed away 2022-09-14 following motor vehicle accident when he was crossing the road.  .  Then she was admitted for 3 days 11/17/2022 through 11/20/2022 with nausea and vomiting.  She was also diagnosed with pneumonia.  The hospitalist contacted me.  Out of caution advised him to hold off giving any nintedanib she did receive IV community-acquired pneumonia therapy.  Review of the labs indicate she did not suffer from acute kidney injury.  Her discharge sodium was slightly low at 134.  Blood cultures negative.She had CT abdomen and pelvis 11/11/2022 that  did not show any acute process in the abdomen. She was discharged to SNF.  She tells me that at baseline she was taking Zofran once daily with some mild nintedanib induced nausea but the onset of nausea for this admission was quite abrupt and severe.  She does not know the cause.  After discharge from the SNF 2 weeks ago and now for the last 10 days she is back taking nintedanib.  She is having some mild drug-induced nausea and she says she is able to combat this with taking Zofran twice daily.  At this point in time she prefers to take Zofran along with the nintedanib and see what happens.   For a respiratory standpoint she is frustrated she not been able to get a pulmonary function test since August 2023.  Her ill health and also PFT machine not working have delayed this.  Subjectively she is feeling the same and clinically she feels stable from an ILD standpoint.  Therefore I suspect it is stable.   OV 03/02/2023  Subjective:  Patient ID: Stacey [name], female , DOB: Sep 23, 1940 , age 28 y.o. , MRN: [id number] , ADDRESS: [address] [place] [address] PCP [name], MD Patient Care Team: [name], MD as PCP - General (Internal Medicine) Jodelle Red, MD as PCP - Cardiology (Cardiology)  This Provider  for this visit: Treatment Team:  Attending Provider: Kalman Shan, MD  2    03/02/2023 -   Chief Complaint  Patient presents with   Follow-up    F/up on PFT     HPI LEANI MYRON 83 y.o. -returns for follow-up.  She at this point is tolerating her nintedanib at 100 mg twice daily but she is increased her Zofran.  This visit is to see her pulmonary function test.  In May 2024 she did have CT abdomen the lung images and that she did show progressive ILD compared to 2020 07/2020.  Her pulmonary function test during this time had declined but today the FVC is improved while the DLCO is worse.  I think overall this will be stable compared to a year ago  but directionally she does have progressive phenotype.  Her last liver function test was in May 2024.  She will have repeat liver function test today.  She also will participate in the ILD-Pro research registry protocol.  I did discuss with her about inhaled pirfenidone versus placebo in a clinical trial development protocol.  She is interested in this and I gave her a copy of the consent.     OV 08/26/2023  Subjective:  Patient ID: Stacey [name], female , DOB: April 24, 1941 , age 72 y.o. , MRN: [id number] , ADDRESS: [place] [address] [place] [address] PCP Eloisa Northern, MD Patient Care Team: Eloisa Northern, MD as PCP - General (Internal Medicine) Jodelle Red, MD as PCP - Cardiology (Cardiology)  This Provider for this visit: Treatment Team:  Attending Provider: Kalman Shan, MD    Rheumatoid arthritis with Joint s/ILD- Rituxan/steroids [facility])  - progressive phenotype (CT 2017/2019 -> Oct 2020). Planned Ofev start in Nov 2020 but did not start as of dec 2020. 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 Admitted 06/11/2019  - 06/16/2019. Rx with Remdesviri and Decadron.  - sp evushed x 2 Status once influenza hospitalization for mild respiratory failure February 2025.  Admitted August 2021 for respiratory infection not otherwise specified.  Following trip to  [place].  Was hypoxemic.  Small Hiatal hernia on CT Oct 2020  Rib fracture from fall Dec 2020: Musculoskeletal: There is nondisplaced fractures of the posterior left fifth, 6, 8, 9, and tenth left ribs. There is unchanged slight superior compression deformities of the T5, T6, T10 vertebral bodies with less than 25% loss in height.Associated pulmnary contusion +  -On Reclast once a year through Dr. Rexene Agent  Normal ECHO Summer 2020 -> possible [facility] May 2021 RSVP 30s with GR1 ddx  -  Normal ECHO may 202    Nintedanib/Ofev requires intensive drug monitoring due to high concerns for Adverse effects of , including  Drug Induced Liver Injury, significant GI side effects that include but not limited to Diarrhea, Nausea, Vomiting,  and other system side effects that include Fatigue,  weight loss. Cardiac side effects are a black box warning as well. These will be monitored with  blood work such as LFT initially once a month for 6 months and then quarterly   08/26/2023 -   Chief Complaint  Patient presents with   Follow-up    ILD F/U      HPI Stacey [name] 83 y.o. -returns for follow-up.  She is now relocated to a house near the office.  This is because she became a widow.  However shortly after moving to the house she picked up the influenza.  This was early February 2025 this year this month.  She was hospitalized in mild respiratory failure and 1 day of acute kidney injury and some dehydration.  Since then she has been admitted to skilled nursing facility rehabilitation.  She needs 1 more week of this before discharge.  However she continues nintedanib for her ILD and it is stable.  She also maintains a every 65-month Rituxan.  The last dose was in August 2024.  The next dose is due [facility]/30/2023 at [facility].  She says now being a widow and living alone and also being deconditioned after influenza she is wondering if she can get her Rituxan locally.  I did indicate to her that our  office has an adjacent infusion center run by [facility].  She was delighted to hear this because there is only 1 mile from her house.  Therefore I have corresponded with Dr. Kennieth Francois at [facility] and also pharmacy team to see if Rituxan is still indicated and if so the dosage.  Waiting to hear on that.  Given all this she wants to hold off on in participating on any recent study.  On exam she did have crackles and squeaks but she does not feel she is in an exacerbation.  She is due for an ILD-pro registry visit today.   Last CT scan of the chest was in 2021 Last pulmonary function test was in August 2024   Of note she tells me that for the last few months she has had pedal edema and this been no workup.  Hospital lab review does not show she has had a BNP or echo.  Last echo was in 2022.   For this visit external records from the hospital were reviewed.  Also communication was established with other providers.  She is on  SYMPTOM SCALE - ILD 03/27/2019  05/24/2019 (2-3 weeks    .mrmonpre cpovid)  08/03/2019 Post covid Post fall rib # 12/18/2019  04/10/2020 142# resp virus admit aug 2021 nos 08/01/2020 134#  10/22/2020 136# Now doing rehab 12/23/2020 136# 03/10/2021 131# Ofev 150mg  bid 04/08/2021 132# Ofev 100mg  bid 07/18/2021 133# ofev 100 x 2 03/06/2022 132#, ofev 100x2 12/11/2022 128# 03/02/2023   O2 use ra       ra ra ra ra ra ra ra  Shortness of Breath 0 -> 5 scale with 5 being worst (score 6 If unable to do)               At rest 0 2 3 2 3 0 3 0 3 1  2 1  4   Simple tasks - showers, clothes change, eating, shaving 0 3 2 3 4 1 3 2 3 1  3 3  3   Household (dishes, doing bed, laundry) 2 4 5 3 4 3 3 3 4 3 3 4   4  Shopping 2 3 6 4 4 4 4 3 4 4 4 4  4   Walking level at own pace 2 3 4 4 3 2 4 2 3 3 3 4  4   Walking up Stairs 3 3 5 4 4 4 4 3 4 4 4 5  4   Total (40 - 48) Dyspnea Score 9 17 25 20 22 14 21 13 21 16 19 21  23   How bad is your cough? 2 2 3 1 2  0 0 0  1 0 0 0  3  How bad is your fatigue 3 3.5 5 3.5 3 4 3 2 3 3 4 4  3   nause    0 1 1 0 1 2.5 1 1 1   0  vomit    0 1 1 0 1 2.4 1 1 1   0  diarrhea    0 1 0 0 0 1 0 0 0  0  anxiety    0 2 0 1 0 1 0 0 1  0  depression    0 1 0 0 0 0 0 0 0  0     Simple office walk  feet x  3 laps goal with forehead probe 04/12/2018  07/21/2018  05/24/2019  12/18/2019  04/10/2020  08/01/2020  10/22/2020  03/10/2021  07/18/2021  03/06/2022  12/11/2022  08/26/2023   O2 used Room air - off o2 x 10 min Room air  Room air ra ra ra ra ra ra ra ra ra  Number laps completed 3 x 185 feet 3 x 250 feet    r    3 1 lap Sit and satnd - di dnot need walker  Comments about pace normal normal   Slow, no walker Slow . No walker.  Slow. No walker. No cane walker   waler   Resting Pulse Ox/HR 99% and 72/min 97% and 74/min 97% and 81/min 97% and 76/m 96% and 88/min 94% and 83 96% and HR 79 97% and HR 51 100% and 78 99% and HR 73 97% and HR 76 98% and HR 78  Final Pulse Ox/HR 98% and 95/min 96% and 96/min 94% and 107/min 93% and 94 92% and 97 90% and 100 89% and 104/min 92% and 106 97% and 102 92% and HR 105 94% ad HR 80 93% and HR 90  Desaturated </= 88% no no no no no yes yes no no no    Desaturated <= 3% points no no Yes, 3 pints Ues, 4 points Yes,  4 points Yes, 4 points Yes, 7 points  Yes, 3 pints Yes, 7 poins  Yes 5 points  Got Tachycardic >/= 90/min yes yes yes yes yes yes yes   yes    Symptoms at end of test No complaint Mild dyspnea Mod dyspnea Some dyspnea dyspnea dyspnie cat 3d Dyspneic moderate  Dyspneic and wheezing Mod dyspnea Mopd dyspea    Miscellaneous comments improed from hospital Same v improved woprse same  Wobbly gait baseline Needed 2 break  Stopped x 2 to get balanced and staggered while alking         PFT     Latest Ref Rng & Units 03/01/2023   11:45 AM 03/06/2022    3:01 PM 07/02/2021    3:07 PM 03/07/2021   11:05 AM 12/23/2020    1:03 PM 10/22/2020    9:05 AM 09/10/2020    9:52 AM  PFT Results  FVC-Pre L  1.81  1.62  1.69  1.78  1.66  1.73  1.76   FVC-Predicted Pre % 69  61  62  66  60  63  64   Pre FEV1/FVC % % 76  74  73  72  71  76  75   FEV1-Pre L 1.39  1.20  1.23  1.28  1.17  1.30  1.32   FEV1-Predicted Pre % 71  61  61  64  57  64  64   DLCO uncorrected ml/min/mmHg 11.53  12.57  15.35  12.94  14.98  13.67  15.38   DLCO UNC% % 59  65  79  66  77  70  79   DLCO corrected ml/min/mmHg 11.53  12.57  15.35  12.94  14.98  13.80  15.38   DLCO COR %Predicted % 59  65  79  66  77  71  79   DLVA Predicted % 78  84  100  84  86  80  102        LAB RESULTS last 96 hours No results found.       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   reports that she has never smoked. She has never used smokeless tobacco.  Past Surgical History:  Procedure Laterality Date   CHOLECYSTECTOMY  2011   IR ANGIO INTRA EXTRACRAN SEL COM CAROTID INNOMINATE BILAT MOD SED  06/18/2020   IR ANGIO VERTEBRAL SEL SUBCLAVIAN INNOMINATE UNI R MOD SED  06/18/2020   IR ANGIO VERTEBRAL SEL VERTEBRAL UNI L MOD SED  06/18/2020   WISDOM TOOTH EXTRACTION      Allergies  Allergen Reactions   Penicillins Other (See Comments)    Unknown reaction  Did it involve swelling of the face/tongue/throat, SOB, or low BP? Unknown Did it involve sudden or severe rash/hives, skin peeling, or any reaction on the inside of your mouth or nose? Unknown Did you need to seek medical attention at a hospital or doctor's office? No When did it last happen?     childhood  If all above answers are "NO", may proceed with cephalosporin use.     Remicade [Infliximab] Other (See Comments)    Unknown reaction    Immunization  History  Administered Date(s) Administered   Fluad Quad(high Dose 65+) 04/23/2020, 03/23/2022   Influenza Split 04/12/2013, 04/12/2014, 03/14/2015, 04/23/2016   Influenza, High Dose Seasonal PF 04/12/2017, 03/24/2018, 04/13/2019, 03/27/2021   Influenza-Unspecified 04/03/2013, 04/22/2017   Moderna Sars-Covid-2 Vaccination 08/25/2019, 09/22/2019, 03/07/2020   Pfizer Covid-19 Vaccine Bivalent Booster 22yrs & up 05/16/2021   Pneumococcal Conjugate-13 04/03/2013, 09/28/2014   Pneumococcal Polysaccharide-23 07/13/2012, 07/24/2013   Respiratory Syncytial Virus Vaccine,Recomb Aduvanted(Arexvy) 04/12/2022   Td 01/11/2018   Tdap 12/10/2017    Family History  Problem Relation Age of Onset   Asthma Mother    Anemia Mother    Polymyalgia rheumatica Mother    COPD Father    Pulmonary fibrosis Father    Breast cancer Maternal Grandmother 37     Current Outpatient Medications:    acetaminophen (TYLENOL) 500 MG tablet, Take 1,000 mg by mouth every 6 (six) hours as needed for moderate pain (pain score 4-6)., Disp: , Rfl:    amitriptyline (ELAVIL) 25 MG tablet, Take 25 mg by mouth at bedtime., Disp: ,  Rfl: 0   Cholecalciferol (VITAMIN D-3 PO), Take 1 capsule by mouth daily., Disp: , Rfl:    feeding supplement (ENSURE ENLIVE / ENSURE PLUS) LIQD, Take 237 mLs by mouth 2 (two) times daily between meals. (Patient taking differently: Take 237 mLs by mouth 2 (two) times daily as needed.), Disp: 237 mL, Rfl: 12   gabapentin (NEURONTIN) 300 MG capsule, Take 600 mg by mouth at bedtime., Disp: , Rfl:    losartan (COZAAR) 25 MG tablet, Take 25 mg by mouth daily., Disp: , Rfl:    Nintedanib (OFEV) 100 MG CAPS, Take 1 capsule (100 mg total) by mouth 2 (two) times daily., Disp: 60 capsule, Rfl: 10   ondansetron (ZOFRAN) 4 MG tablet, TAKE ONE TABLET BY MOUTH EVERY 8 HOURS AS NEEDED FOR NAUSEA AND VOMITING (Patient taking differently: Take 4 mg by mouth See admin instructions. Take 1 tablet (4mg ) every morning and  may take up to 2 more tablets during the day (up to three tablets daily) if needed for nausea.), Disp: 30 tablet, Rfl: 5   pantoprazole (PROTONIX) 40 MG tablet, Take 40 mg by mouth daily., Disp: , Rfl:    polyethylene glycol (MIRALAX / GLYCOLAX) 17 g packet, Take 17 g by mouth daily as needed for mild constipation., Disp: 14 each, Rfl: 0   pravastatin (PRAVACHOL) 20 MG tablet, Take 1 tablet (20 mg total) by mouth daily., Disp: 90 tablet, Rfl: 1   riTUXimab (RITUXAN IV), Inject 1,000 mg into the vein every 6 (six) months., Disp: , Rfl:    venlafaxine XR (EFFEXOR-XR) 75 MG 24 hr capsule, Take 75 mg by mouth at bedtime. , Disp: , Rfl:       Objective:   Vitals:   08/26/23 1326  BP: (!) 153/72  Pulse: 82  Temp: 97.9 F (36.6 C)  TempSrc: Oral  SpO2: 98%  Weight: 127 lb 3.2 oz (57.7 kg)  Height: 5\' 6"  (1.676 m)    Estimated body mass index is 20.53 kg/m as calculated from the following:   Height as of this encounter: 5\' 6"  (1.676 m).   Weight as of this encounter: 127 lb 3.2 oz (57.7 kg).  @WEIGHTCHANGE @  [employer]   08/26/23 1326  Weight: 127 lb 3.2 oz (57.7 kg)     Physical Exam   General: No distress.  Frail female nods her head at baseline. O2 at rest: no Cane present: no Sitting in wheel chair: no Frail: YES Obese: no Neuro: Alert and Oriented x 3. GCS 15. Speech normal Psych: Pleasant Resp:  Barrel Chest - no.  Wheeze -squeaks present, Crackles -scattered cracklesn, No overt respiratory distress CVS: Normal heart sounds. Murmurs - no Ext: Stigmata of Connective Tissue Disease - RA dformteis HEENT: Normal upper airway. PEERL +. No post nasal drip        Assessment:       ICD-10-CM   1. ILD (interstitial lung disease) (HCC)  J84.9 CBC w/Diff    Comp Met (CMET)    B Nat Peptide    CT Chest High Resolution    Pulmonary function test    ECHOCARDIOGRAM COMPLETE    2. Encounter for long-term current use of high risk medication  Z79.899 CBC w/Diff     Comp Met (CMET)    B Nat Peptide    CT Chest High Resolution    Pulmonary function test    ECHOCARDIOGRAM COMPLETE    3. Pedal edema  R60.0 CBC w/Diff    Comp Met (CMET)  B Nat Peptide    CT Chest High Resolution    Pulmonary function test    ECHOCARDIOGRAM COMPLETE    4. Bibasilar crackles  R09.89 CBC w/Diff    Comp Met (CMET)    B Nat Peptide    CT Chest High Resolution    Pulmonary function test    ECHOCARDIOGRAM COMPLETE    5. DOE (dyspnea on exertion)  R06.09 CBC w/Diff    Comp Met (CMET)    B Nat Peptide    CT Chest High Resolution    Pulmonary function test    ECHOCARDIOGRAM COMPLETE    6. History of influenza  Z87.09          Plan:     Patient Instructions     ICD-10-CM   1. ILD (interstitial lung disease) (HCC)  J84.9 CBC w/Diff    Comp Met (CMET)    B Nat Peptide    CT Chest High Resolution    Pulmonary function test    2. Encounter for long-term current use of high risk medication  Z79.899 CBC w/Diff    Comp Met (CMET)    B Nat Peptide    CT Chest High Resolution    Pulmonary function test    3. Pedal edema  R60.0 CBC w/Diff    Comp Met (CMET)    B Nat Peptide    CT Chest High Resolution    Pulmonary function test    4. Bibasilar crackles  R09.89 CBC w/Diff    Comp Met (CMET)    B Nat Peptide    CT Chest High Resolution    Pulmonary function test    5. DOE (dyspnea on exertion)  R06.09 CBC w/Diff    Comp Met (CMET)    B Nat Peptide    CT Chest High Resolution    Pulmonary function test         Interstitial lung disease due to connective tissue disease (HCC) Encounter for long-term current use of high risk medication  -Seems you are tolerating Ofev rechallenge now with taking Zofran twice daily on a scheduled basis - PFT slowly worse over time but unclera if worse x 1 year - Oxygen level stabel 08/26/2023 but we do need to restate  Plan - continue ofev 100mg  twice daily - can take zofran 4mg  twice daily  - -Continue Rituxan  and steroids  prednisome 3mg  per day through the rheumatologist  - sending message to [facility] to see if you still need rituxan and if you do if we can do it at our office location - continuie  breo 100 dosing daily once with albuterol as needed -Check  cbc, bmet , lft, bnp 08/26/2023  - ILD-PRO research visit 08/26/2023 - Hold off AVALYN or TETON 305 study evaluation for another 1-3 months du to recent flu   - do HRCT in 3 months  - do spiro and dlco in 3 months   Asthma, unspecified asthma severity, unspecified whether complicated, unspecified whether persistent  - stable  Plan  -continue breo scheduled with albuteraol as neede - if worse call for higher dose prednisoe  PEDAL EDEMA x few months  Plan  - check bnp  - check echo  Followup  -  30 min  visit in 3 months   - symptoms score and walking desaturation test at followup  = can cancel if on research study   FOLLOWUP Return in about 3 months (around 11/23/2023) for 30 min visit, after Cleda Daub and DLCO,  after HRCT chest, with Dr Marchelle Gearing, ILD.    SIGNATURE    Dr. Kalman Shan, M.D., F.C.C.P,  Pulmonary and Critical Care Medicine Staff Physician, [facility] Center Director - Interstitial Lung Disease  Program  Pulmonary Fibrosis [facility] at [facility] [place], [place], [address]  Pager: [phone], If no answer or between  15:00h - 7:00h: call [phone] Telephone: [phone]  2:21 PM 08/26/2023    HIGh Complexity  OFFICE   2021 E/M guidelines, first released in 2021, with minor revisions added in 2023. Must meet the requirements for 2 out of 3 dimensions to qualify.    Number and complexity of problems addressed Amount and/or complexity of data reviewed Risk of complications and/or morbidity  Severe exacerbation of chronic illness  Acute or chronic illnesses that may pose a threat to life or bodily function, e.g., multiple trauma, acute MI, pulmonary embolus,  severe respiratory distress, progressive rheumatoid arthritis, psychiatric illness with potential threat to self or others, peritonitis, acute renal failure, abrupt change in neurological status Must meet the requirements for 2 of 3 of the categories)  Category 1: Tests and documents, historian  Any combination of 3 of the following:  Assessment requiring an independent historian  Review of prior external note(s) from each unique source  Review of results of each unique test  Ordering of each unique test    Category 2: Interpretation of tests    Independent interpretation of a test performed by another physician/other qualified health care professional (not separately reported)  Category 3: Discuss management/tests  Discussion of management or test interpretation with external physician/other qualified health care professional/appropriate source (not separately reported)  HIGH risk of morbidity from additional diagnostic testing or treatment Examples only:  Drug therapy requiring intensive monitoring for toxicity  Decision for elective major surgery with identified pateint or procedure risk factors  Decision regarding hospitalization or escalation of level of care  Decision for DNR or to de-escalate care   Parenteral controlled  substances            LEGEND - Independent interpretation involves the interpretation of a test for which there is a CPT code, and an interpretation or report is customary. When a review and interpretation of a test is performed and documented by the provider, but not separately reported (billed), then this would represent an independent interpretation. This report does not need to conform to the usual standards of a complete report of the test. This does not include interpretation of tests that do not have formal reports such as a complete blood count with differential and blood cultures. Examples would include reviewing a chest radiograph  and documenting in the medical record an interpretation, but not separately reporting (billing) the interpretation of the chest radiograph.   An appropriate source includes professionals who are not health care professionals but may be involved in the management of the patient, such as a Clinical research associate, upper officer, case manager or teacher, and does not include discussion with family or informal caregivers.    - SDOH: SDOH are the conditions in the environments where people are born, live, learn, work, play, worship, and age that affect a wide range of health, functioning, and quality-of-life outcomes and risks. (e.g., housing, food insecurity, transportation, etc.). SDOH-related Z codes ranging from Z55-Z65 are the ICD-10-CM diagnosis codes used to document SDOH data Z55 - Problems related to education and literacy Z56 - Problems related to employment and unemployment Z57 -  Occupational exposure to risk factors Z58 - Problems related to physical environment Z59 - Problems related to housing and economic circumstances [phone] - Problems related to social environment [phone] - Problems related to upbringing [phone] - Other problems related to primary support group, including family circumstances Z8 - Problems related to certain psychosocial circumstances Z65 - Problems related to other psychosocial circumstances

## 2023-08-26 NOTE — Telephone Encounter (Signed)
Rx team  KIMORAH RIDOLFI gets Rituxan at [facility] but now she is a widow and she actually lives only 1 mile away and she is also been hospitalized.  I did send an email to [facility] to see if it is still indicated.  Her next dose is 08/30/2023 but given the recent influenza this needs to be postponed.  Could you please facilitate the communication with [facility] and starting infusions here by March 2025   Thakns    SIGNATURE    Dr. Kalman Shan, M.D., F.C.C.P,  Pulmonary and Critical Care Medicine Staff Physician, [facility] Center Director - Interstitial Lung Disease  Program  Pulmonary Fibrosis [facility] at [facility] [place], [place], [address]   Pager: [phone], If no answer  -> Check AMION or Try [phone] Telephone (clinical office): [phone] Telephone (research): [phone]  2:20 PM 08/26/2023

## 2023-08-26 NOTE — Research (Addendum)
 Title: Chronic Fibrosing Interstitial Lung Disease with Progressive Phenotype Prospective Outcomes (ILD-PRO) Registry    Protocol #: [id number], Clinical Trials # [id number], Sponsor: [facility] [employer]   Protocol Version Amendment 6 dated 30Apr2024  and confirmed current on 10Sep2024 Consent Version for today's visit date of  Is [employer] IRB Approved Version 22May2024 Revised 29Jul2024   Objectives:  Describe current approaches to diagnosis and treatment of chronic fibrosing ILDs with progressive phenotype  Describe the natural history of chronic fibrosing ILDs with progressive phenotype  Assess quality of life from self-administered participant reported questionnaires for each disease group  Describe participant interactions with the healthcare system, describe treatment practices across multiple institutions for each disease group  Collect biological samples linked to well characterized chronic fibrosing ILDs with progressive phenotype to identify disease biomarkers  Collect data and biological samples that will support future research studies.                                            Key Inclusion Criteria: Willing and able to provide informed consent  Age >= 30 years  Diagnosis of a non-IPF ILD of any duration, including, but not limited to Idiopathic Non-Specific Interstitial Pneumonia (INSIP), Unclassifiable Idiopathic Interstitial Pneumonias (IIPs), Interstitial Pneumonia with Autoimmune Features (IPAF), Autoimmune ILDs such as Rheumatoid Arthritis (RA-ILD) and Systemic Sclerosis (SSC-ILD), Chronic Hypersensitivity Pneumonitis (HP), Sarcoidosis or Exposure-related ILDs such as asbestosis.  Chronic fibrosing ILD defined by reticular abnormality with traction bronchiectasis with or without honeycombing confirmed by chest HRCT scan and/or lung biopsy.  Progressive phenotype as defined by fulfilling at least one of the criteria below of fibrotic changes (progression set  point) within the last 24 months regardless of treatment considered appropriate in individual ILDs:  decline in FVC % predicted (% pred) based on >10% relative decline  decline in FVC % pred based on >= 5 - <10% relative decline in FVC combined with worsening of respiratory symptoms as assessed by the site investigator  decline in FVC % pred based on >= 5 - <10% relative decline in FVC combined with increasing extent of fibrotic changes on chest imaging (HRCT scan) as assessed by the site investigator  decline in DLCO % pred based on >= 10% relative decline  worsening of respiratory symptoms as well as increasing extent of fibrotic changes on chest imaging (HRCT scan) as assessed by the site investigator independent of FVC change.     Key Exclusion Criteria: Malignancy, treated or untreated, other than skin or early stage prostate cancer, within the past 5 years  Currently listed for lung transplantation at the time of enrollment  Currently enrolled in a clinical trial at the time of enrollment in this registry    Clinical Research Coordinator / Research RN note : This visit for  Subject [id number] with DOB: Jul 31, 1940 on 13Feb2025 for the above protocol is Visit/Encounter 8 and is for purpose of research.    Subject expressed continued interest and consent in continuing as a study subject. Subject confirmed that there was no change in contact information (e.g. address, telephone, email). Subject thanked for participation in research and contribution to science.     During this visit on, the subject completed the blood work and questionnaires per the above referenced protocol. Please refer to the subject's paper source binder for further details.   Signed by Despina Hick  Clinical Research Coordinator [facility]  [place], [place]

## 2023-08-26 NOTE — Patient Instructions (Addendum)
ICD-10-CM   1. ILD (interstitial lung disease) (HCC)  J84.9 CBC w/Diff    Comp Met (CMET)    B Nat Peptide    CT Chest High Resolution    Pulmonary function test    2. Encounter for long-term current use of high risk medication  Z79.899 CBC w/Diff    Comp Met (CMET)    B Nat Peptide    CT Chest High Resolution    Pulmonary function test    3. Pedal edema  R60.0 CBC w/Diff    Comp Met (CMET)    B Nat Peptide    CT Chest High Resolution    Pulmonary function test    4. Bibasilar crackles  R09.89 CBC w/Diff    Comp Met (CMET)    B Nat Peptide    CT Chest High Resolution    Pulmonary function test    5. DOE (dyspnea on exertion)  R06.09 CBC w/Diff    Comp Met (CMET)    B Nat Peptide    CT Chest High Resolution    Pulmonary function test         Interstitial lung disease due to connective tissue disease (HCC) Encounter for long-term current use of high risk medication  -Seems you are tolerating Ofev rechallenge now with taking Zofran twice daily on a scheduled basis - PFT slowly worse over time but unclera if worse x 1 year - Oxygen level stabel 08/26/2023 but we do need to restate  Plan - continue ofev 100mg  twice daily - can take zofran 4mg  twice daily  - -Continue Rituxan and steroids  prednisome 3mg  per day through the rheumatologist  - sending message to [facility] to see if you still need rituxan and if you do if we can do it at our office location - continuie  breo 100 dosing daily once with albuterol as needed -Check  cbc, bmet , lft, bnp 08/26/2023  - ILD-PRO research visit 08/26/2023 - Hold off AVALYN or TETON 305 study evaluation for another 1-3 months du to recent flu   - do HRCT in 3 months  - do spiro and dlco in 3 months   Asthma, unspecified asthma severity, unspecified whether complicated, unspecified whether persistent  - stable  Plan  -continue breo scheduled with albuteraol as neede - if worse call for higher dose prednisoe  PEDAL EDEMA x few  months  Plan  - check bnp  - check echo  Followup  -  30 min  visit in 3 months   - symptoms score and walking desaturation test at followup  = can cancel if on research study

## 2023-08-30 ENCOUNTER — Other Ambulatory Visit: Payer: Self-pay

## 2023-08-30 ENCOUNTER — Emergency Department (HOSPITAL_COMMUNITY): Payer: Medicare Other

## 2023-08-30 ENCOUNTER — Encounter (HOSPITAL_COMMUNITY): Payer: Self-pay

## 2023-08-30 ENCOUNTER — Inpatient Hospital Stay (HOSPITAL_COMMUNITY)
Admission: EM | Admit: 2023-08-30 | Discharge: 2023-09-03 | DRG: 189 | Disposition: A | Discharge status: Skilled Nursing Facility | Payer: Medicare Other | Source: Skilled Nursing Facility | Attending: Internal Medicine | Admitting: Internal Medicine

## 2023-08-30 DIAGNOSIS — I5021 Acute systolic (congestive) heart failure: Secondary | ICD-10-CM | POA: Diagnosis not present

## 2023-08-30 DIAGNOSIS — L03116 Cellulitis of left lower limb: Secondary | ICD-10-CM | POA: Diagnosis not present

## 2023-08-30 DIAGNOSIS — R41841 Cognitive communication deficit: Secondary | ICD-10-CM | POA: Diagnosis not present

## 2023-08-30 DIAGNOSIS — J40 Bronchitis, not specified as acute or chronic: Secondary | ICD-10-CM | POA: Diagnosis present

## 2023-08-30 DIAGNOSIS — J4489 Other specified chronic obstructive pulmonary disease: Secondary | ICD-10-CM | POA: Diagnosis not present

## 2023-08-30 DIAGNOSIS — E876 Hypokalemia: Secondary | ICD-10-CM | POA: Diagnosis not present

## 2023-08-30 DIAGNOSIS — L0390 Cellulitis, unspecified: Secondary | ICD-10-CM

## 2023-08-30 DIAGNOSIS — Z79620 Long term (current) use of immunosuppressive biologic: Secondary | ICD-10-CM

## 2023-08-30 DIAGNOSIS — J849 Interstitial pulmonary disease, unspecified: Secondary | ICD-10-CM | POA: Diagnosis not present

## 2023-08-30 DIAGNOSIS — G9340 Encephalopathy, unspecified: Secondary | ICD-10-CM

## 2023-08-30 DIAGNOSIS — Z888 Allergy status to other drugs, medicaments and biological substances status: Secondary | ICD-10-CM

## 2023-08-30 DIAGNOSIS — Z803 Family history of malignant neoplasm of breast: Secondary | ICD-10-CM

## 2023-08-30 DIAGNOSIS — Z825 Family history of asthma and other chronic lower respiratory diseases: Secondary | ICD-10-CM

## 2023-08-30 DIAGNOSIS — R112 Nausea with vomiting, unspecified: Secondary | ICD-10-CM | POA: Diagnosis present

## 2023-08-30 DIAGNOSIS — K219 Gastro-esophageal reflux disease without esophagitis: Secondary | ICD-10-CM | POA: Diagnosis not present

## 2023-08-30 DIAGNOSIS — M533 Sacrococcygeal disorders, not elsewhere classified: Secondary | ICD-10-CM | POA: Diagnosis not present

## 2023-08-30 DIAGNOSIS — Z880 Allergy status to penicillin: Secondary | ICD-10-CM

## 2023-08-30 DIAGNOSIS — Z1152 Encounter for screening for COVID-19: Secondary | ICD-10-CM | POA: Diagnosis not present

## 2023-08-30 DIAGNOSIS — L03119 Cellulitis of unspecified part of limb: Secondary | ICD-10-CM

## 2023-08-30 DIAGNOSIS — R296 Repeated falls: Secondary | ICD-10-CM | POA: Diagnosis not present

## 2023-08-30 DIAGNOSIS — N179 Acute kidney failure, unspecified: Secondary | ICD-10-CM | POA: Diagnosis present

## 2023-08-30 DIAGNOSIS — L03115 Cellulitis of right lower limb: Secondary | ICD-10-CM | POA: Diagnosis present

## 2023-08-30 DIAGNOSIS — R918 Other nonspecific abnormal finding of lung field: Secondary | ICD-10-CM | POA: Diagnosis not present

## 2023-08-30 DIAGNOSIS — J984 Other disorders of lung: Secondary | ICD-10-CM | POA: Diagnosis not present

## 2023-08-30 DIAGNOSIS — R2681 Unsteadiness on feet: Secondary | ICD-10-CM | POA: Diagnosis not present

## 2023-08-30 DIAGNOSIS — F329 Major depressive disorder, single episode, unspecified: Secondary | ICD-10-CM | POA: Diagnosis not present

## 2023-08-30 DIAGNOSIS — I341 Nonrheumatic mitral (valve) prolapse: Secondary | ICD-10-CM | POA: Diagnosis not present

## 2023-08-30 DIAGNOSIS — M6281 Muscle weakness (generalized): Secondary | ICD-10-CM | POA: Diagnosis not present

## 2023-08-30 DIAGNOSIS — E785 Hyperlipidemia, unspecified: Secondary | ICD-10-CM | POA: Diagnosis not present

## 2023-08-30 DIAGNOSIS — I10 Essential (primary) hypertension: Secondary | ICD-10-CM | POA: Diagnosis not present

## 2023-08-30 DIAGNOSIS — I700 Atherosclerosis of aorta: Secondary | ICD-10-CM | POA: Diagnosis not present

## 2023-08-30 DIAGNOSIS — Z66 Do not resuscitate: Secondary | ICD-10-CM | POA: Diagnosis present

## 2023-08-30 DIAGNOSIS — R0689 Other abnormalities of breathing: Secondary | ICD-10-CM | POA: Diagnosis not present

## 2023-08-30 DIAGNOSIS — Z9049 Acquired absence of other specified parts of digestive tract: Secondary | ICD-10-CM | POA: Diagnosis not present

## 2023-08-30 DIAGNOSIS — E8770 Fluid overload, unspecified: Secondary | ICD-10-CM | POA: Diagnosis present

## 2023-08-30 DIAGNOSIS — M810 Age-related osteoporosis without current pathological fracture: Secondary | ICD-10-CM | POA: Diagnosis present

## 2023-08-30 DIAGNOSIS — K449 Diaphragmatic hernia without obstruction or gangrene: Secondary | ICD-10-CM | POA: Diagnosis present

## 2023-08-30 DIAGNOSIS — R0902 Hypoxemia: Secondary | ICD-10-CM

## 2023-08-30 DIAGNOSIS — J00 Acute nasopharyngitis [common cold]: Secondary | ICD-10-CM | POA: Diagnosis not present

## 2023-08-30 DIAGNOSIS — I2510 Atherosclerotic heart disease of native coronary artery without angina pectoris: Secondary | ICD-10-CM | POA: Diagnosis not present

## 2023-08-30 DIAGNOSIS — J8410 Pulmonary fibrosis, unspecified: Secondary | ICD-10-CM | POA: Diagnosis not present

## 2023-08-30 DIAGNOSIS — J9601 Acute respiratory failure with hypoxia: Secondary | ICD-10-CM | POA: Diagnosis not present

## 2023-08-30 DIAGNOSIS — Z79899 Other long term (current) drug therapy: Secondary | ICD-10-CM

## 2023-08-30 DIAGNOSIS — M069 Rheumatoid arthritis, unspecified: Secondary | ICD-10-CM | POA: Diagnosis not present

## 2023-08-30 DIAGNOSIS — E43 Unspecified severe protein-calorie malnutrition: Secondary | ICD-10-CM | POA: Diagnosis not present

## 2023-08-30 DIAGNOSIS — G9349 Other encephalopathy: Secondary | ICD-10-CM | POA: Diagnosis not present

## 2023-08-30 DIAGNOSIS — R062 Wheezing: Secondary | ICD-10-CM | POA: Diagnosis not present

## 2023-08-30 LAB — CBC WITH DIFFERENTIAL/PLATELET
Abs Immature Granulocytes: 0.09 10*3/uL — ABNORMAL HIGH (ref 0.00–0.07)
Basophils Absolute: 0.1 10*3/uL (ref 0.0–0.1)
Basophils Relative: 1 %
Eosinophils Absolute: 0.5 10*3/uL (ref 0.0–0.5)
Eosinophils Relative: 3 %
HCT: 35.1 % — ABNORMAL LOW (ref 36.0–46.0)
Hemoglobin: 11.2 g/dL — ABNORMAL LOW (ref 12.0–15.0)
Immature Granulocytes: 1 %
Lymphocytes Relative: 24 %
Lymphs Abs: 3.9 10*3/uL (ref 0.7–4.0)
MCH: 28.9 pg (ref 26.0–34.0)
MCHC: 31.9 g/dL (ref 30.0–36.0)
MCV: 90.5 fL (ref 80.0–100.0)
Monocytes Absolute: 1.3 10*3/uL — ABNORMAL HIGH (ref 0.1–1.0)
Monocytes Relative: 8 %
Neutro Abs: 10.6 10*3/uL — ABNORMAL HIGH (ref 1.7–7.7)
Neutrophils Relative %: 63 %
Platelets: 481 10*3/uL — ABNORMAL HIGH (ref 150–400)
RBC: 3.88 MIL/uL (ref 3.87–5.11)
RDW: 13.9 % (ref 11.5–15.5)
WBC: 16.5 10*3/uL — ABNORMAL HIGH (ref 4.0–10.5)
nRBC: 0 % (ref 0.0–0.2)

## 2023-08-30 LAB — COMPREHENSIVE METABOLIC PANEL
ALT: 18 U/L (ref 0–44)
AST: 29 U/L (ref 15–41)
Albumin: 2.8 g/dL — ABNORMAL LOW (ref 3.5–5.0)
Alkaline Phosphatase: 119 U/L (ref 38–126)
Anion gap: 12 (ref 5–15)
BUN: 13 mg/dL (ref 8–23)
CO2: 25 mmol/L (ref 22–32)
Calcium: 8.1 mg/dL — ABNORMAL LOW (ref 8.9–10.3)
Chloride: 94 mmol/L — ABNORMAL LOW (ref 98–111)
Creatinine, Ser: 0.77 mg/dL (ref 0.44–1.00)
GFR, Estimated: >60 mL/min (ref >60)
Glucose, Bld: 96 mg/dL (ref 70–99)
Potassium: 3.9 mmol/L (ref 3.5–5.1)
Sodium: 131 mmol/L — ABNORMAL LOW (ref 135–145)
Total Bilirubin: 0.6 mg/dL (ref 0.0–1.2)
Total Protein: 5.7 g/dL — ABNORMAL LOW (ref 6.5–8.1)

## 2023-08-30 LAB — CBC
HCT: 31.3 % — ABNORMAL LOW (ref 36.0–46.0)
Hemoglobin: 9.8 g/dL — ABNORMAL LOW (ref 12.0–15.0)
MCH: 28.6 pg (ref 26.0–34.0)
MCHC: 31.3 g/dL (ref 30.0–36.0)
MCV: 91.3 fL (ref 80.0–100.0)
Platelets: 390 10*3/uL (ref 150–400)
RBC: 3.43 MIL/uL — ABNORMAL LOW (ref 3.87–5.11)
RDW: 13.9 % (ref 11.5–15.5)
WBC: 14.9 10*3/uL — ABNORMAL HIGH (ref 4.0–10.5)
nRBC: 0 % (ref 0.0–0.2)

## 2023-08-30 LAB — I-STAT CHEM 8, ED
BUN: 11 mg/dL (ref 8–23)
Calcium, Ion: 1.11 mmol/L — ABNORMAL LOW (ref 1.15–1.40)
Chloride: 97 mmol/L — ABNORMAL LOW (ref 98–111)
Creatinine, Ser: 0.8 mg/dL (ref 0.44–1.00)
Glucose, Bld: 116 mg/dL — ABNORMAL HIGH (ref 70–99)
HCT: 30 % — ABNORMAL LOW (ref 36.0–46.0)
Hemoglobin: 10.2 g/dL — ABNORMAL LOW (ref 12.0–15.0)
Potassium: 4.1 mmol/L (ref 3.5–5.1)
Sodium: 132 mmol/L — ABNORMAL LOW (ref 135–145)
TCO2: 25 mmol/L (ref 22–32)

## 2023-08-30 LAB — LIPASE, BLOOD: Lipase: 18 U/L (ref 11–51)

## 2023-08-30 LAB — PROTIME-INR
INR: 1.2 (ref 0.8–1.2)
Prothrombin Time: 15.4 s — ABNORMAL HIGH (ref 11.4–15.2)

## 2023-08-30 LAB — TROPONIN I (HIGH SENSITIVITY)
Troponin I (High Sensitivity): 9 ng/L (ref <18)
Troponin I (High Sensitivity): 12 ng/L (ref <18)

## 2023-08-30 LAB — RESP PANEL BY RT-PCR (RSV, FLU A&B, COVID)  RVPGX2
Influenza A by PCR: NEGATIVE
Influenza B by PCR: NEGATIVE
Resp Syncytial Virus by PCR: NEGATIVE
SARS Coronavirus 2 by RT PCR: NEGATIVE

## 2023-08-30 LAB — APTT: aPTT: 35 s (ref 24–36)

## 2023-08-30 LAB — CREATININE, SERUM
Creatinine, Ser: 0.78 mg/dL (ref 0.44–1.00)
GFR, Estimated: >60 mL/min (ref >60)

## 2023-08-30 LAB — I-STAT CG4 LACTIC ACID, ED: Lactic Acid, Venous: 1.1 mmol/L (ref 0.5–1.9)

## 2023-08-30 LAB — BRAIN NATRIURETIC PEPTIDE: B Natriuretic Peptide: 91.5 pg/mL (ref 0.0–100.0)

## 2023-08-30 MED ORDER — ACETAMINOPHEN 325 MG PO TABS: 650.0000 mg | ORAL_TABLET | Freq: Four times a day (QID) | ORAL | Status: DC | PRN

## 2023-08-30 MED ORDER — ACETYLCYSTEINE 20 % IN SOLN
4.0000 mL | Freq: Three times a day (TID) | RESPIRATORY_TRACT | Status: DC
  Filled 2023-08-30 (×2): qty 4

## 2023-08-30 MED ORDER — AMITRIPTYLINE HCL 25 MG PO TABS
25.0000 mg | ORAL_TABLET | Freq: Every day | ORAL | Status: DC
  Administered 2023-08-30 – 2023-09-02 (×4): 25 mg via ORAL
  Filled 2023-08-30 (×4): qty 1

## 2023-08-30 MED ORDER — IPRATROPIUM-ALBUTEROL 0.5-2.5 (3) MG/3ML IN SOLN
3.0000 mL | Freq: Two times a day (BID) | RESPIRATORY_TRACT | Status: DC
  Administered 2023-08-31 – 2023-09-03 (×5): 3 mL via RESPIRATORY_TRACT
  Filled 2023-08-30 (×6): qty 3

## 2023-08-30 MED ORDER — VANCOMYCIN HCL IN DEXTROSE 1-5 GM/200ML-% IV SOLN: 1000.0000 mg | Freq: Once | INTRAVENOUS | Status: DC

## 2023-08-30 MED ORDER — VITAMIN D 25 MCG (1000 UNIT) PO TABS
1000.0000 [IU] | ORAL_TABLET | Freq: Every evening | ORAL | Status: DC
  Administered 2023-08-30 – 2023-09-02 (×4): 1000 [IU] via ORAL
  Filled 2023-08-30 (×4): qty 1

## 2023-08-30 MED ORDER — POLYETHYLENE GLYCOL 3350 17 G PO PACK: 17.0000 g | PACK | Freq: Every day | ORAL | Status: DC | PRN

## 2023-08-30 MED ORDER — IPRATROPIUM-ALBUTEROL 0.5-2.5 (3) MG/3ML IN SOLN
3.0000 mL | RESPIRATORY_TRACT | Status: DC
  Administered 2023-08-30: 3 mL via RESPIRATORY_TRACT
  Filled 2023-08-30 (×2): qty 3

## 2023-08-30 MED ORDER — ENSURE ENLIVE PO LIQD
1.0000 | Freq: Two times a day (BID) | ORAL | Status: DC
  Administered 2023-08-30 – 2023-09-03 (×6): 237 mL via ORAL
  Filled 2023-08-30: qty 237

## 2023-08-30 MED ORDER — SODIUM CHLORIDE 0.9% FLUSH
3.0000 mL | Freq: Two times a day (BID) | INTRAVENOUS | Status: DC
  Administered 2023-08-30 – 2023-09-03 (×8): 3 mL via INTRAVENOUS

## 2023-08-30 MED ORDER — METRONIDAZOLE 500 MG/100ML IV SOLN
500.0000 mg | Freq: Two times a day (BID) | INTRAVENOUS | Status: DC
  Administered 2023-08-30 – 2023-09-01 (×4): 500 mg via INTRAVENOUS
  Filled 2023-08-30 (×4): qty 100

## 2023-08-30 MED ORDER — SODIUM CHLORIDE 0.9 % IV SOLN
2.0000 g | INTRAVENOUS | Status: DC
  Administered 2023-08-30 – 2023-09-02 (×4): 2 g via INTRAVENOUS
  Filled 2023-08-30 (×4): qty 20

## 2023-08-30 MED ORDER — PRAVASTATIN SODIUM 20 MG PO TABS
20.0000 mg | ORAL_TABLET | Freq: Every day | ORAL | Status: DC
  Administered 2023-08-31 – 2023-09-03 (×4): 20 mg via ORAL
  Filled 2023-08-30 (×4): qty 1

## 2023-08-30 MED ORDER — ACETYLCYSTEINE 20 % IN SOLN
4.0000 mL | Freq: Two times a day (BID) | RESPIRATORY_TRACT | Status: DC
  Administered 2023-08-31 – 2023-09-02 (×4): 4 mL via RESPIRATORY_TRACT
  Filled 2023-08-30 (×8): qty 4

## 2023-08-30 MED ORDER — ONDANSETRON HCL 4 MG PO TABS: 4.0000 mg | ORAL_TABLET | Freq: Three times a day (TID) | ORAL | Status: DC | PRN

## 2023-08-30 MED ORDER — LIDOCAINE 5 % EX PTCH
1.0000 | MEDICATED_PATCH | CUTANEOUS | Status: DC
  Administered 2023-08-31 – 2023-09-03 (×4): 1 via TRANSDERMAL
  Filled 2023-08-30 (×4): qty 1

## 2023-08-30 MED ORDER — PANTOPRAZOLE SODIUM 40 MG PO TBEC
40.0000 mg | DELAYED_RELEASE_TABLET | Freq: Every day | ORAL | Status: DC
  Administered 2023-08-31 – 2023-09-03 (×4): 40 mg via ORAL
  Filled 2023-08-30 (×4): qty 1

## 2023-08-30 MED ORDER — VENLAFAXINE HCL ER 75 MG PO CP24
75.0000 mg | ORAL_CAPSULE | Freq: Every day | ORAL | Status: DC
  Administered 2023-08-31 – 2023-09-03 (×4): 75 mg via ORAL
  Filled 2023-08-30 (×4): qty 1

## 2023-08-30 MED ORDER — METHYLPREDNISOLONE SODIUM SUCC 125 MG IJ SOLR
125.0000 mg | Freq: Once | INTRAMUSCULAR | Status: AC
  Administered 2023-08-30: 125 mg via INTRAVENOUS
  Filled 2023-08-30: qty 2

## 2023-08-30 MED ORDER — ACETYLCYSTEINE 10% NICU INHALATION SOLUTION: 4.0000 mL | Freq: Three times a day (TID) | RESPIRATORY_TRACT | Status: DC

## 2023-08-30 MED ORDER — NINTEDANIB ESYLATE 100 MG PO CAPS
1.0000 | ORAL_CAPSULE | Freq: Two times a day (BID) | ORAL | Status: DC
  Filled 2023-08-30: qty 1

## 2023-08-30 MED ORDER — ORAL CARE MOUTH RINSE: 15.0000 mL | OROMUCOSAL | Status: DC | PRN

## 2023-08-30 MED ORDER — METRONIDAZOLE 500 MG/100ML IV SOLN
500.0000 mg | Freq: Once | INTRAVENOUS | Status: AC
  Administered 2023-08-30: 500 mg via INTRAVENOUS
  Filled 2023-08-30: qty 100

## 2023-08-30 MED ORDER — FUROSEMIDE 40 MG PO TABS
40.0000 mg | ORAL_TABLET | Freq: Every day | ORAL | Status: DC
  Administered 2023-08-31 – 2023-09-03 (×4): 40 mg via ORAL
  Filled 2023-08-30 (×4): qty 1

## 2023-08-30 MED ORDER — LACTATED RINGERS IV SOLN: INTRAVENOUS | Status: AC

## 2023-08-30 MED ORDER — IPRATROPIUM-ALBUTEROL 0.5-2.5 (3) MG/3ML IN SOLN
3.0000 mL | Freq: Once | RESPIRATORY_TRACT | Status: AC
  Administered 2023-08-30: 3 mL via RESPIRATORY_TRACT
  Filled 2023-08-30: qty 3

## 2023-08-30 MED ORDER — VANCOMYCIN HCL 1250 MG/250ML IV SOLN
1250.0000 mg | Freq: Once | INTRAVENOUS | Status: AC
  Administered 2023-08-30: 1250 mg via INTRAVENOUS
  Filled 2023-08-30: qty 250

## 2023-08-30 MED ORDER — ENOXAPARIN SODIUM 40 MG/0.4ML IJ SOSY
40.0000 mg | PREFILLED_SYRINGE | INTRAMUSCULAR | Status: DC
  Administered 2023-08-31 – 2023-09-03 (×4): 40 mg via SUBCUTANEOUS
  Filled 2023-08-30 (×4): qty 0.4

## 2023-08-30 MED ORDER — ONDANSETRON HCL 4 MG/2ML IJ SOLN
4.0000 mg | Freq: Once | INTRAMUSCULAR | Status: AC
  Administered 2023-08-30: 4 mg via INTRAVENOUS
  Filled 2023-08-30: qty 2

## 2023-08-30 MED ORDER — FUROSEMIDE 10 MG/ML IJ SOLN
40.0000 mg | Freq: Once | INTRAMUSCULAR | Status: AC
  Administered 2023-08-30: 40 mg via INTRAVENOUS
  Filled 2023-08-30: qty 4

## 2023-08-30 MED ORDER — SODIUM CHLORIDE 0.9 % IV SOLN
100.0000 mg | Freq: Two times a day (BID) | INTRAVENOUS | Status: DC
  Administered 2023-08-30 – 2023-09-01 (×4): 100 mg via INTRAVENOUS
  Filled 2023-08-30 (×5): qty 100

## 2023-08-30 MED ORDER — IOHEXOL 350 MG/ML SOLN
75.0000 mL | Freq: Once | INTRAVENOUS | Status: AC | PRN
  Administered 2023-08-30: 75 mL via INTRAVENOUS

## 2023-08-30 MED ORDER — GABAPENTIN 300 MG PO CAPS
600.0000 mg | ORAL_CAPSULE | Freq: Every day | ORAL | Status: DC
  Administered 2023-08-30 – 2023-09-02 (×4): 600 mg via ORAL
  Filled 2023-08-30 (×4): qty 2

## 2023-08-30 MED ORDER — SODIUM CHLORIDE 0.9 % IV SOLN
2.0000 g | Freq: Once | INTRAVENOUS | Status: AC
  Administered 2023-08-30: 2 g via INTRAVENOUS
  Filled 2023-08-30: qty 12.5

## 2023-08-30 MED ORDER — ACETAMINOPHEN 650 MG RE SUPP: 650.0000 mg | Freq: Four times a day (QID) | RECTAL | Status: DC | PRN

## 2023-08-30 NOTE — ED Triage Notes (Signed)
Pt BIB EMS from [facility] due to SOB and low SpO2 levels of 78% on RA. Pt also vomiting this morning. Pt Dx with Flu and cellulitis of both legs recently. Hx of COPD.  In Route 5 mg Albuterol CBG 109 BP 147/100 RR 32

## 2023-08-30 NOTE — Assessment & Plan Note (Signed)
Seems bilatearl. Present prior to admission. On keflex as outpatient.  Since patient is on Unasyn at this time hold same.  Monitor clinically.  Patient does have a wound on the left leg that appears like a laceration.  I will request wound care evaluation.

## 2023-08-30 NOTE — Assessment & Plan Note (Signed)
Patient noted to have lower extremity edema bilaterally.  I do think this is out of proportion to the cellulitis noted.  I will treat the patient with oral Lasix.  Monitor intake output.  Check urine protein creatinine ratio.  LFTs are within normal limit.  An outpatient echo is already pending, I will make it inpatient. Trop wnl

## 2023-08-30 NOTE — Assessment & Plan Note (Signed)
Patient has interstitial lung disease in the setting of known rheumatoid arthritis as well as GERD.  Patient takes Nintedanib at home - known to caue GI intolerance. However, I am not sure at thsi time if this aptient is having generliased GI intolerance. C.w. md. monitor

## 2023-08-30 NOTE — Assessment & Plan Note (Signed)
See HPI and exam.  Patient seems to have mild nonfocal cognitive impairment acutely.  Likely due to her acute metabolic Derangement including hypoxemia.  Possible pneumonia.  Will monitor this clinically.

## 2023-08-30 NOTE — Assessment & Plan Note (Addendum)
Patient presents with rather abrupt onset of dyspnea today, see HPI on 2lpm oxygen.  Found to be tachycardic, marked leukocytosis compared to her baseline as well as finding of rhonchi bilaterally.  The finding of rhonchi seems to be new based on the discharge summary done on 5 fourth.  Further patient CT chest is showing chronic interstitial lung disease, unchanged from prior.  Moderate bronchial thickening with areas of mucoid impaction in the lower lobes.  No wheezing on exam.  Given the entirety of the clinical picture which was preceded by reported vomiting(currenlty resolved) -principal concern is for aspiration pneumonia.  Patient is s/p vancomycin cefepime and Flagyl.  Lactic acid is WNL . Therefore we will treat the patient with Unasyn.  Given that the patient recently had influenza A, further concern for superimposed bacterial infection.  Therefore I will treat the patient with doxycycline for atypicals and possible MRSA.  Patient was found to have sepsis in the ER.   I will also treat ith patient turn and position and mucolytic therapy. Patient is s.p methylprednisolone 125 in ER. Hold furhter steroids for now.

## 2023-08-30 NOTE — Assessment & Plan Note (Signed)
 Cw. pantop

## 2023-08-30 NOTE — H&P (Signed)
History and Physical    Patient: Stacey [name]:[id number] DOB: May 20, 1941 DOA: 08/30/2023 DOS: the patient was seen and examined on 08/30/2023 PCP: Eloisa Northern, MD  Patient coming from: SNF  Chief Complaint:  Chief Complaint  Patient presents with   Shortness of Breath   HPI: Stacey [name] is a 83 y.o. female with medical history significant of discharged from the [facility] health system on 11/14/2023.  Patient has a known history of interstitial lung disease GERD rheumatoid arthritis, essential hypertension.  During her last hospitalization she had presented to the ER for persistent nausea and vomiting.  It was felt to be due to influenza A.  Patient was treated with Tamiflu and it seems that the conservative management was successful.  Patient was discharged to a nursing home facility.  Per note from 11/23/2023, pulmonary is concerned that her interstitial lung disease is likely due to reflux disease.  Regardless patient typically does not use supplementary oxygen at home.  Patient has been under treatment for cellulitis of both legs recently with antibiotics at her SNF.  Patient seems to be at her baseline till at least last evening and overnight.  Per report obtained from the ER provider Dr. Dalene Seltzer, patient seems to have had at least 1 episode of vomiting this morning.  Subsequently patient was noted to be tachypneic at her nursing home facility and having increased work of breathing and difficulty speaking.  Patient was brought in by EMS.  Patient was noted to be hypoxic to 70% SpO2.  Requiring 2 L/min of supplementary oxygen.  Medical evaluation is sought.  Patient is interviewed directly for this encounter and is oriented to location name date of birth.  However is not able to give me a verbose account of what presented her to the hospital.  Patient is only able to give answers to directed questions in yes no format.  Patient denies any chest pain at this time.  Denies any shortness  of breath at this time.  Patient is aware of having chronic leg swelling at least for a few weeks requiring treatment.  Denies any belly pain denies any diarrhea report passing gas.  Denies feeling nauseous or vomiting at this time.  Does corroborate having vomiting.  Denies any bleeding in the vomitus.    Review of Systems: unable to review all systems due to the inability of the patient to answer questions. 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 She has never used smokeless tobacco. She reports that she does not drink alcohol and does not use drugs.  Allergies  Allergen Reactions   Penicillins Other (See Comments)    Unknown reaction  Did it involve swelling of the face/tongue/throat, SOB, or low BP? Unknown Did it involve sudden or severe rash/hives, skin peeling, or any reaction on the inside of your mouth or nose? Unknown Did you need to seek medical attention at a hospital or doctor's office? No When did it last happen?     childhood  If all above answers are "NO", may proceed with cephalosporin use.     Remicade [Infliximab] Other (See Comments)    Unknown reaction    Family History  Problem Relation Age of Onset   Asthma Mother     Anemia Mother    Polymyalgia rheumatica Mother    COPD Father    Pulmonary fibrosis Father    Breast cancer Maternal Grandmother 4    Prior to Admission medications   Medication Sig Start Date End Date Taking? Authorizing Provider  acetaminophen (TYLENOL) 500 MG tablet Take 1,000 mg by mouth every 6 (six) hours as needed for moderate pain (pain score 4-6).   Yes [provider]  amitriptyline (ELAVIL) 25 MG tablet Take 25 mg by mouth at bedtime. 02/04/15  Yes [provider]  Calcium Carbonate-Vitamin D (CALCIUM-VITAMIN D PO) Take 1 tablet by mouth in the morning and at bedtime. 500-25 mg/mcg   Yes [provider]  cephALEXin (KEFLEX) 500 MG capsule Take 500 mg by mouth 4 (four) times daily. For 7 days starting on 08/28/23   Yes [provider]  cholecalciferol (VITAMIN D3) 25 MCG (1000 UNIT) tablet Take 1,000 Units by mouth every evening.   Yes [provider]  gabapentin (NEURONTIN) 300 MG capsule Take 600 mg by mouth at bedtime.   Yes [provider]  lidocaine 4 % Place 1 patch onto the skin daily. Apply to coccyx   Yes [provider]  losartan (COZAAR) 25 MG tablet Take 25 mg by mouth daily. 02/04/21  Yes [provider]  Nintedanib (OFEV) 100 MG CAPS Take 1 capsule (100 mg total) by mouth 2 (two) times daily. 12/02/22  Yes Hughie Closs, MD  Nutritional Supplements (ENSURE ORIGINAL) LIQD Take 1 Bottle by mouth in the morning and at bedtime.   Yes [provider]  ondansetron (ZOFRAN) 4 MG tablet TAKE ONE TABLET BY MOUTH EVERY 8 HOURS AS NEEDED FOR NAUSEA AND VOMITING Patient taking differently: Take 4 mg by mouth every 8 (eight) hours as needed for nausea or vomiting. 03/17/23  Yes Kalman Shan, MD  pantoprazole (PROTONIX) 40 MG tablet Take 40 mg by mouth daily.   Yes [provider]  polyethylene glycol (MIRALAX / GLYCOLAX) 17 g packet Take 17 g by mouth daily as needed for mild constipation.  06/25/23  Yes Kathlen Mody, MD  pravastatin (PRAVACHOL) 20 MG tablet Take 1 tablet (20 mg total) by mouth daily. 02/10/23  Yes Jodelle Red, MD  venlafaxine XR (EFFEXOR-XR) 75 MG 24 hr capsule Take 75 mg by mouth daily.   Yes [provider]    Physical Exam: Vitals:   08/30/23 1140 08/30/23 1330 08/30/23 1611 08/30/23 1620  BP: (!) 143/62 125/72  135/68  Pulse: 95 (!) 101  88  Resp: (!) 26 19  (!) 25  Temp:   98.9 F (37.2 C) 99.2 F (37.3 C)  TempSrc: Oral  Oral Oral  SpO2: 91% 95%  97%   General: Patient is thin, therefore ill appearance is unable to provide history in coherent manner.  Although  seems to be fully oriented as mentioned in the HPI.  Not distressed.  However speaks in broken manner.  With long pauses.  Cooperative and calm on 2 L/min of supplementary oxygen Respiratory exam: Diffuse coarse crackles. Cardiovascular exam: S1-S2 normal, tachycardia noted Abdomen soft nontender Extremities warm There is 1-2+ edema bilateral ankles right up to the knee.  On the left leg there is a laceration like wound approximately 2 cm longitudinally about 1 cm laterally.  Deep to the subcutaneous tissue.  Reasonable granulation tissue and fat tissue visible.  No evidence of infection. Dusky erythema seems to involve both legs right more than left.  Tenderness and warmth present on palpation.  Distal function intact: No motor deficit focal is noted.  Symmetric facies   Media Information  Document Information  Photos  Legs  08/30/2023 17:07  Attached To:  Hospital Encounter on 08/30/23  Source Information  Nolberto Hanlon, MD  [facility]  Document History     Data Reviewed:  Labs on Admission:  Results for orders placed or performed during the hospital encounter of 08/30/23 (from the past 24 hours)  Comprehensive metabolic panel     Status: Abnormal   Collection Time: 08/30/23 11:22 AM  Result Value Ref Range   Sodium 131 (L) 135 - 145 mmol/L    Potassium 3.9 3.5 - 5.1 mmol/L   Chloride 94 (L) 98 - 111 mmol/L   CO2 25 22 - 32 mmol/L   Glucose, Bld 96 70 - 99 mg/dL   BUN 13 8 - 23 mg/dL   Creatinine, Ser 1.61 0.44 - 1.00 mg/dL   Calcium 8.1 (L) 8.9 - 10.3 mg/dL   Total Protein 5.7 (L) 6.5 - 8.1 g/dL   Albumin 2.8 (L) 3.5 - 5.0 g/dL   AST 29 15 - 41 U/L   ALT 18 0 - 44 U/L   Alkaline Phosphatase 119 38 - 126 U/L   Total Bilirubin 0.6 0.0 - 1.2 mg/dL   GFR, Estimated >09 >60 mL/min   Anion gap 12 5 - 15  CBC with Differential     Status: Abnormal   Collection Time: 08/30/23 11:22 AM  Result Value Ref Range   WBC 16.5 (H) 4.0 - 10.5 K/uL   RBC 3.88 3.87 - 5.11 MIL/uL   Hemoglobin 11.2 (L) 12.0 - 15.0 g/dL   HCT 45.4 (L) 09.8 - 11.9 %   MCV 90.5 80.0 - 100.0 fL   MCH 28.9 26.0 - 34.0 pg   MCHC 31.9 30.0 - 36.0 g/dL   RDW 14.7 82.9 - 56.2 %   Platelets 481 (H) 150 - 400 K/uL   nRBC 0.0 0.0 - 0.2 %   Neutrophils Relative % 63 %   Neutro Abs 10.6 (H) 1.7 - 7.7 K/uL   Lymphocytes Relative 24 %   Lymphs Abs 3.9 0.7 - 4.0 K/uL   Monocytes Relative 8 %   Monocytes Absolute 1.3 (H) 0.1 - 1.0 K/uL   Eosinophils Relative 3 %   Eosinophils Absolute 0.5 0.0 - 0.5 K/uL   Basophils Relative 1 %   Basophils Absolute 0.1 0.0 - 0.1 K/uL   Immature Granulocytes 1 %   Abs Immature Granulocytes 0.09 (H) 0.00 - 0.07 K/uL  Brain natriuretic peptide     Status: None   Collection Time: 08/30/23 11:22 AM  Result Value Ref Range   B Natriuretic Peptide 91.5 0.0 - 100.0 pg/mL  Troponin I (High Sensitivity)     Status: None   Collection Time:  08/30/23 11:22 AM  Result Value Ref Range   Troponin I (High Sensitivity) 9 <18 ng/L  Blood Culture (routine x 2)     Status: None (Preliminary result)   Collection Time: 08/30/23 11:34 AM   Specimen: BLOOD RIGHT ARM  Result Value Ref Range   Specimen Description      BLOOD RIGHT ARM Performed at [facility], [address], [place], [place] [address]    Special Requests      BOTTLES  DRAWN AEROBIC AND ANAEROBIC Blood Culture adequate volume Performed at [facility], [address] [place], [place] [address]    Culture PENDING    Report Status PENDING   Resp panel by RT-PCR (RSV, Flu A&B, Covid) Anterior Nasal Swab     Status: None   Collection Time: 08/30/23  4:17 PM   Specimen: Anterior Nasal Swab  Result Value Ref Range   SARS Coronavirus 2 by RT PCR NEGATIVE NEGATIVE   Influenza A by PCR NEGATIVE NEGATIVE   Influenza B by PCR NEGATIVE NEGATIVE   Resp Syncytial Virus by PCR NEGATIVE NEGATIVE  Troponin I (High Sensitivity)     Status: None   Collection Time: 08/30/23  4:17 PM  Result Value Ref Range   Troponin I (High Sensitivity) 12 <18 ng/L  I-stat chem 8, ED (not at [facility], [facility] or [facility])     Status: Abnormal   Collection Time: 08/30/23  4:32 PM  Result Value Ref Range   Sodium 132 (L) 135 - 145 mmol/L   Potassium 4.1 3.5 - 5.1 mmol/L   Chloride 97 (L) 98 - 111 mmol/L   BUN 11 8 - 23 mg/dL   Creatinine, Ser 8.29 0.44 - 1.00 mg/dL   Glucose, Bld 562 (H) 70 - 99 mg/dL   Calcium, Ion 1.30 (L) 1.15 - 1.40 mmol/L   TCO2 25 22 - 32 mmol/L   Hemoglobin 10.2 (L) 12.0 - 15.0 g/dL   HCT 86.5 (L) 78.4 - 69.6 %  I-Stat Lactic Acid, ED     Status: None   Collection Time: 08/30/23  4:33 PM  Result Value Ref Range   Lactic Acid, Venous 1.1 0.5 - 1.9 mmol/L   Basic Metabolic Panel: Recent Labs  Lab 08/26/23 1442 08/30/23 1122 08/30/23 1632  NA 137 131* 132*  K 3.8 3.9 4.1  CL 100 94* 97*  CO2 30 25  --   GLUCOSE 80 96 116*  BUN 9 13 11   CREATININE 0.66 0.77 0.80  CALCIUM 8.4 8.1*  --    Liver Function Tests: Recent Labs  Lab 08/26/23 1442 08/30/23 1122  AST 30 29  ALT 18 18  ALKPHOS 77 119  BILITOT 0.4 0.6  PROT 5.9* 5.7*  ALBUMIN 3.3* 2.8*   No results for input(s): "LIPASE", "AMYLASE" in the last 168 hours. No results for input(s): "AMMONIA" in the last 168 hours. CBC: Recent Labs  Lab 08/26/23 1442 08/30/23 1122  08/30/23 1632  WBC 8.6 16.5*  --   NEUTROABS 4.3 10.6*  --   HGB 11.2* 11.2* 10.2*  HCT 33.7* 35.1* 30.0*  MCV 88.6 90.5  --   PLT 454.0* 481*  --    Cardiac Enzymes: Recent Labs  Lab 08/30/23 1122 08/30/23 1617  TROPONINIHS 9 12    BNP (last 3 results) Recent Labs    08/26/23 1442  PROBNP 60.0   CBG: No results for input(s): "GLUCAP" in the last 168 hours.  Radiological Exams on Admission:  DG Sacrum/Coccyx  Result Date: 08/30/2023 CLINICAL DATA:  Recent fall, pain. EXAM: SACRUM AND COCCYX - 2+ VIEW COMPARISON:  Reformats from 06/21/2023 CT FINDINGS: No evidence of acute fracture. The cortical margins of the sacrum and coccyx are intact. Sacral ala are maintained. No sacroiliac diastasis. Excreted IV contrast within the distal ureters from prior chest CTA. IMPRESSION: No evidence of sacrococcygeal fracture. Electronically Signed   By: Narda Rutherford M.D.   On: 08/30/2023 15:26   CT Angio Chest PE W and/or Wo Contrast Result Date: 08/30/2023 CLINICAL DATA:  Shortness of breath. EXAM: CT ANGIOGRAPHY CHEST WITH CONTRAST TECHNIQUE: Multidetector CT imaging of the chest was performed using the standard protocol during bolus administration of intravenous contrast. Multiplanar CT image reconstructions and MIPs were obtained to evaluate the vascular anatomy. RADIATION DOSE REDUCTION: This exam was performed according to the departmental dose-optimization program which includes automated exposure control, adjustment of the mA and/or kV according to patient size and/or use of iterative reconstruction technique. CONTRAST:  75mL OMNIPAQUE IOHEXOL 350 MG/ML SOLN COMPARISON:  Radiograph earlier today.  Chest CT 06/21/2023 FINDINGS: Cardiovascular: There are no filling defects within the pulmonary arteries to suggest pulmonary embolus. Heart is upper normal in size. There are coronary artery calcifications. Diffuse aortic atherosclerosis. 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 No pericardial effusion. Mediastinum/Nodes: No enlarged mediastinal lymph nodes. No suspicious hilar adenopathy. Small hiatal hernia with wall thickening of the distal esophagus. Lungs/Pleura: Chronic interstitial lung disease with subpleural reticulation and ground-glass. There is biapical pleuroparenchymal scarring. Moderate bronchial thickening with areas of mucoid impaction in the lower lobes. No pleural effusion. Small calcified granuloma in the right upper lobe, needing no further imaging follow-up. Upper Abdomen: No acute findings. Cholecystectomy. Mild chronic mid lower thoracic compression deformities. Remote bilateral rib fractures. There are no acute or suspicious osseous abnormalities. Musculoskeletal: Review of the MIP images confirms the above findings. IMPRESSION: 1. No pulmonary embolus. 2. Chronic interstitial lung disease, unchanged from prior. 3. Moderate bronchial thickening with areas of mucoid impaction in the lower lobes, can be seen with bronchitis or reactive airways disease. 4. Small hiatal hernia with wall thickening of the distal esophagus, query esophagitis. Aortic Atherosclerosis (ICD10-I70.0). Electronically Signed   By: Narda Rutherford M.D.   On: 08/30/2023 15:25   DG Chest Port 1 View Result Date: 08/30/2023 CLINICAL DATA:  Questionable sepsis - evaluate for abnormality Shortness of breath. EXAM: PORTABLE CHEST 1 VIEW COMPARISON:  Radiograph 08/15/2023. Subsequent chest CT available at time of radiograph interpretation. FINDINGS: Stable heart size and mediastinal contours. Aortic atherosclerosis. Sequela of chronic interstitial lung disease with subpleural reticulation. No superimposed acute airspace disease. No pleural fluid or pneumothorax. IMPRESSION: Sequela of chronic interstitial lung disease. No superimposed acute finding. Electronically Signed   By: Narda Rutherford M.D.   On: 08/30/2023 15:19    chest X-ray  EKG: Independently reviewed. Sinus tachy  No  intake/output data recorded. No intake/output data recorded.        Assessment and Plan: * Hypoxia Patient presents with rather abrupt onset of dyspnea today, see HPI on 2lpm oxygen.  Found to be tachycardic, marked leukocytosis compared to her baseline as well as finding of rhonchi bilaterally.  The finding of rhonchi seems to be new based on the discharge summary done on 5 fourth.  Further patient CT chest is showing chronic interstitial lung disease, unchanged from prior.  Moderate bronchial thickening with areas of mucoid impaction in the lower lobes.  No wheezing on exam.  Given the  entirety of the clinical picture which was preceded by reported vomiting(currenlty resolved) -principal concern is for aspiration pneumonia.  Patient is s/p vancomycin cefepime and Flagyl.  Lactic acid is WNL . Therefore we will treat the patient with Unasyn.  Given that the patient recently had influenza A, further concern for superimposed bacterial infection.  Therefore I will treat the patient with doxycycline for atypicals and possible MRSA.  Patient was found to have sepsis in the ER.   I will also treat ith patient turn and position and mucolytic therapy. Patient is s.p methylprednisolone 125 in ER. Hold furhter steroids for now.  Nausea & vomiting Single episode reported this a.m.  No recurrence in the hospital.  Patient has known GERD.  At this time I will allow the patient diet and monitor.  Abdominal exam is benign.  She is not complaining of any abdominal pain.  Troponin is negative.  I will check a lipase.  Cellulitis Seems bilatearl. Present prior to admission. On keflex as outpatient.  Since patient is on Unasyn at this time hold same.  Monitor clinically.  Patient does have a wound on the left leg that appears like a laceration.  I will request wound care evaluation.  Fluid overload Patient noted to have lower extremity edema bilaterally.  I do think this is out of proportion to the cellulitis  noted.  I will treat the patient with oral Lasix.  Monitor intake output.  Check urine protein creatinine ratio.  LFTs are within normal limit.  An outpatient echo is already pending, I will make it inpatient. Trop wnl  Encephalopathy See HPI and exam.  Patient seems to have mild nonfocal cognitive impairment acutely.  Likely due to her acute metabolic Derangement including hypoxemia.  Possible pneumonia.  Will monitor this clinically.  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 esophagitis C.w. pantop  Restrictive lung disease Patient has interstitial lung disease in the setting of known rheumatoid arthritis as well as GERD.  Patient takes Nintedanib at home - known to caue GI intolerance. However, I am not sure at thsi time if this aptient is having generliased GI intolerance. C.w. md. monitor  DVT ppx with lovenox ordered.    Advance Care Planning:   Code Status: Do not attempt resuscitation (DNR) PRE-ARREST INTERVENTIONS DESIRED GOLD form at bedside date for 08/15/22  Consults: none at this time.  Family Communication: updated brian Harriman as above.  Severity of Illness: The appropriate patient status for this patient is INPATIENT. Inpatient status is judged to be reasonable and necessary in order to provide the required intensity of service to ensure the patient's safety. The patient's presenting symptoms, physical exam findings, and initial radiographic and laboratory data in the context of their chronic comorbidities is felt to place them at high risk for further clinical deterioration. Furthermore, it is not anticipated that the patient will be medically stable for discharge from the hospital within 2 midnights of admission.   * I certify that at the point of admission it is my clinical judgment that the patient will require inpatient hospital care spanning beyond 2 midnights from the point of admission due to high intensity of service, high risk for further deterioration and high frequency of surveillance  required.*  Author: Nolberto Hanlon, MD 08/30/2023 5:12 PM  For on call review [email or web address].

## 2023-08-30 NOTE — ED Provider Notes (Addendum)
[place] EMERGENCY DEPARTMENT AT [facility] Provider Note   CSN: [id number] Arrival date & time: 08/30/23  1101     History  Chief Complaint  Patient presents with   Shortness of Breath    Stacey [name] is a 83 y.o. female.  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  Per her facility, [facility] her oxygen saturations were in the 70s.  History is limited by acuity of condition.  Has had nausea and vomiting today. On antibiotics from what facility said to EMS for bilateral lower extremity cellulitis.  EMS reports her sats were down in the 70s and improved when they had given her a DuoNeb en route.  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 Authorizing Provider  acetaminophen (TYLENOL) 500 MG tablet Take 1,000 mg by mouth every 6 (six) hours as needed for moderate pain (pain score 4-6).   Yes [provider]  amitriptyline (ELAVIL) 25 MG tablet Take 25 mg by mouth at bedtime. 02/04/15  Yes [provider]  Calcium Carbonate-Vitamin D (CALCIUM-VITAMIN D PO) Take 1 tablet by mouth in the morning and at bedtime. 500-25 mg/mcg   Yes [provider]  cephALEXin (KEFLEX) 500  MG capsule Take 500 mg by mouth 4 (four) times daily. For 7 days starting on 08/28/23   Yes [provider]  cholecalciferol (VITAMIN D3) 25 MCG (1000 UNIT) tablet Take 1,000 Units by mouth every evening.   Yes [provider]  gabapentin (NEURONTIN) 300 MG capsule Take 600 mg by mouth at bedtime.   Yes [provider]  lidocaine 4 % Place 1 patch onto the skin daily. Apply to coccyx   Yes [provider]  losartan (COZAAR) 25 MG tablet Take 25 mg by mouth daily. 02/04/21  Yes [provider]  Nintedanib (OFEV) 100 MG CAPS Take 1 capsule (100 mg total) by mouth 2 (two) times daily. 12/02/22  Yes Hughie Closs, MD  Nutritional Supplements (ENSURE ORIGINAL) LIQD Take 1 Bottle by mouth in the morning and at bedtime.   Yes [provider]  ondansetron (ZOFRAN) 4 MG tablet TAKE ONE TABLET BY MOUTH EVERY 8 HOURS AS NEEDED FOR NAUSEA AND VOMITING Patient taking differently: Take 4 mg by mouth every 8 (eight) hours as needed for nausea or vomiting. 03/17/23  Yes Kalman Shan, MD  pantoprazole (PROTONIX) 40 MG tablet Take 40 mg by mouth daily.   Yes [provider]  polyethylene glycol (MIRALAX / GLYCOLAX) 17 g packet Take 17 g by mouth daily as needed for mild constipation. 06/25/23  Yes Kathlen Mody, MD  pravastatin (PRAVACHOL) 20 MG tablet Take 1 tablet (20  mg total) by mouth daily. 02/10/23  Yes Jodelle Red, MD  venlafaxine XR (EFFEXOR-XR) 75 MG 24 hr capsule Take 75 mg by mouth daily.   Yes [provider]      Allergies    Penicillins and Remicade [infliximab]    Review of Systems   Review of Systems  Physical Exam Updated Vital Signs BP 127/64 (BP Location: Left Arm)   Pulse 84   Temp 98.1 F (36.7 C) (Oral)   Resp 20   SpO2 96%  Physical Exam Vitals and nursing note reviewed.  Constitutional:      General: She is not in acute distress.    Appearance: She is well-developed. She is not diaphoretic.   HENT:     Head: Normocephalic and atraumatic.  Eyes:     Conjunctiva/sclera: Conjunctivae normal.  Cardiovascular:     Rate and Rhythm: Normal rate and regular rhythm.     Heart sounds: Normal heart sounds. No murmur heard.    No friction rub. No gallop.  Pulmonary:     Effort: Pulmonary effort is normal. No respiratory distress.     Breath sounds: Rales present. No wheezing.  Abdominal:     General: There is no distension.     Palpations: Abdomen is soft.     Tenderness: There is no abdominal tenderness. There is no guarding.  Musculoskeletal:        General: No tenderness.     Cervical back: Normal range of motion.     Right lower leg: Edema present.     Left lower leg: Edema present.  Skin:    General: Skin is warm and dry.     Findings: Erythema (bilateral LE) present. No rash.  Neurological:     Mental Status: She is alert and oriented to person, place, and time.     ED Results / Procedures / Treatments   Labs (all labs ordered are listed, but only abnormal results are displayed) Labs Reviewed  COMPREHENSIVE METABOLIC PANEL - Abnormal; Notable for the following components:      Result Value   Sodium 131 (*)    Chloride 94 (*)    Calcium 8.1 (*)    Total Protein 5.7 (*)    Albumin 2.8 (*)    All other components within normal limits  CBC WITH DIFFERENTIAL/PLATELET - Abnormal; Notable for the following components:   WBC 16.5 (*)    Hemoglobin 11.2 (*)    HCT 35.1 (*)    Platelets 481 (*)    Neutro Abs 10.6 (*)    Monocytes Absolute 1.3 (*)    Abs Immature Granulocytes 0.09 (*)    All other components within normal limits  PROTIME-INR - Abnormal; Notable for the following components:   Prothrombin Time 15.4 (*)    All other components within normal limits  CBC - Abnormal; Notable for the following components:   WBC 14.9 (*)    RBC 3.43 (*)    Hemoglobin 9.8 (*)    HCT 31.3 (*)    All other components within normal limits  I-STAT CHEM 8, ED - Abnormal;  Notable for the following components:   Sodium 132 (*)    Chloride 97 (*)    Glucose, Bld 116 (*)    Calcium, Ion 1.11 (*)    Hemoglobin 10.2 (*)    HCT 30.0 (*)    All other components within normal limits  RESP PANEL BY RT-PCR (RSV, FLU A&B, COVID)  RVPGX2  CULTURE, BLOOD (ROUTINE X  2)  CULTURE, BLOOD (ROUTINE X 2)  BRAIN NATRIURETIC PEPTIDE  APTT  CREATININE, SERUM  LIPASE, BLOOD  URINALYSIS, W/ REFLEX TO CULTURE (INFECTION SUSPECTED)  APTT  PROTIME-INR  BASIC METABOLIC PANEL  CBC  PROTEIN / CREATININE RATIO, URINE  I-STAT CG4 LACTIC ACID, ED  I-STAT CG4 LACTIC ACID, ED  I-STAT CG4 LACTIC ACID, ED  I-STAT CG4 LACTIC ACID, ED  TROPONIN I (HIGH SENSITIVITY)  TROPONIN I (HIGH SENSITIVITY)    EKG EKG Interpretation Date/Time:  Monday August 30 2023 13:50:55 EST Ventricular Rate:  98 PR Interval:  149 QRS Duration:  93 QT Interval:  338 QTC Calculation: 432 R Axis:   -6  Text Interpretation: Sinus or ectopic atrial rhythm No significant change since last tracing Confirmed by Alvira Monday ([id number]) on 08/30/2023 10:20:56 PM  Radiology DG Sacrum/Coccyx Result Date: 08/30/2023 CLINICAL DATA:  Recent fall, pain. EXAM: SACRUM AND COCCYX - 2+ VIEW COMPARISON:  Reformats from 06/21/2023 CT FINDINGS: No evidence of acute fracture. The cortical margins of the sacrum and coccyx are intact. Sacral ala are maintained. No sacroiliac diastasis. Excreted IV contrast within the distal ureters from prior chest CTA. IMPRESSION: No evidence of sacrococcygeal fracture. Electronically Signed   By: Narda Rutherford M.D.   On: 08/30/2023 15:26   CT Angio Chest PE W and/or Wo Contrast Result Date: 08/30/2023 CLINICAL DATA:  Shortness of breath. EXAM: CT ANGIOGRAPHY CHEST WITH CONTRAST TECHNIQUE: Multidetector CT imaging of the chest was performed using the standard protocol during bolus administration of intravenous contrast. Multiplanar CT image reconstructions and MIPs were obtained to  evaluate the vascular anatomy. RADIATION DOSE REDUCTION: This exam was performed according to the departmental dose-optimization program which includes automated exposure control, adjustment of the mA and/or kV according to patient size and/or use of iterative reconstruction technique. CONTRAST:  75mL OMNIPAQUE IOHEXOL 350 MG/ML SOLN COMPARISON:  Radiograph earlier today.  Chest CT 06/21/2023 FINDINGS: Cardiovascular: There are no filling defects within the pulmonary arteries to suggest pulmonary embolus. Heart is upper normal in size. There are coronary artery calcifications. Diffuse aortic atherosclerosis. 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 No pericardial effusion. Mediastinum/Nodes: No enlarged mediastinal lymph nodes. No suspicious hilar adenopathy. Small hiatal hernia with wall thickening of the distal esophagus. Lungs/Pleura: Chronic interstitial lung disease with subpleural reticulation and ground-glass. There is biapical pleuroparenchymal scarring. Moderate bronchial thickening with areas of mucoid impaction in the lower lobes. No pleural effusion. Small calcified granuloma in the right upper lobe, needing no further imaging follow-up. Upper Abdomen: No acute findings. Cholecystectomy. Mild chronic mid lower thoracic compression deformities. Remote bilateral rib fractures. There are no acute or suspicious osseous abnormalities. Musculoskeletal: Review of the MIP images confirms the above findings. IMPRESSION: 1. No pulmonary embolus. 2. Chronic interstitial lung disease, unchanged from prior. 3. Moderate bronchial thickening with areas of mucoid impaction in the lower lobes, can be seen with bronchitis or reactive airways disease. 4. Small hiatal hernia with wall thickening of the distal esophagus, query esophagitis. Aortic Atherosclerosis (ICD10-I70.0). Electronically Signed   By: Narda Rutherford M.D.   On: 08/30/2023 15:25   DG Chest Port 1 View Result Date:  08/30/2023 CLINICAL DATA:  Questionable sepsis - evaluate for abnormality Shortness of breath. EXAM: PORTABLE CHEST 1 VIEW COMPARISON:  Radiograph 08/15/2023. Subsequent chest CT available at time of radiograph interpretation. FINDINGS: Stable heart size and mediastinal contours. Aortic atherosclerosis. Sequela of chronic interstitial lung disease with subpleural reticulation. No superimposed acute airspace disease. No pleural fluid or pneumothorax.  IMPRESSION: Sequela of chronic interstitial lung disease. No superimposed acute finding. Electronically Signed   By: Narda Rutherford M.D.   On: 08/30/2023 15:19    Procedures Procedures    Medications Ordered in ED Medications  lactated ringers infusion ( Intravenous New Bag/Given 08/30/23 1218)  enoxaparin (LOVENOX) injection 40 mg (has no administration in time range)  acetaminophen (TYLENOL) tablet 650 mg (has no administration in time range)    Or  acetaminophen (TYLENOL) suppository 650 mg (has no administration in time range)  polyethylene glycol (MIRALAX / GLYCOLAX) packet 17 g (has no administration in time range)  sodium chloride flush (NS) 0.9 % injection 3 mL (3 mLs Intravenous Given 08/30/23 2109)  furosemide (LASIX) tablet 40 mg (has no administration in time range)  doxycycline (VIBRAMYCIN) 100 mg in sodium chloride 0.9 % 250 mL IVPB (100 mg Intravenous New Bag/Given 08/30/23 2108)  gabapentin (NEURONTIN) capsule 600 mg (600 mg Oral Given 08/30/23 2109)  amitriptyline (ELAVIL) tablet 25 mg (25 mg Oral Given 08/30/23 2109)  venlafaxine XR (EFFEXOR-XR) 24 hr capsule 75 mg (has no administration in time range)  ondansetron (ZOFRAN) tablet 4 mg (has no administration in time range)  pravastatin (PRAVACHOL) tablet 20 mg (has no administration in time range)  lidocaine (LIDODERM) 5 % 1 patch (has no administration in time range)  feeding supplement (ENSURE ENLIVE / ENSURE PLUS) liquid 237 mL (237 mLs Oral Given 08/30/23 2128)  Nintedanib  (Ofev) CAPS 100 mg ( Oral Canceled Entry 08/30/23 2200)  pantoprazole (PROTONIX) EC tablet 40 mg (has no administration in time range)  cholecalciferol (VITAMIN D3) 25 MCG (1000 UNIT) tablet 1,000 Units (1,000 Units Oral Given 08/30/23 1846)  cefTRIAXone (ROCEPHIN) 2 g in sodium chloride 0.9 % 100 mL IVPB (2 g Intravenous New Bag/Given 08/30/23 1846)  metroNIDAZOLE (FLAGYL) IVPB 500 mg (500 mg Intravenous New Bag/Given 08/30/23 1903)  ipratropium-albuterol (DUONEB) 0.5-2.5 (3) MG/3ML nebulizer solution 3 mL (has no administration in time range)  acetylcysteine (MUCOMYST) 20 % nebulizer / oral solution 4 mL (has no administration in time range)  Oral care mouth rinse (has no administration in time range)  ceFEPIme (MAXIPIME) 2 g in sodium chloride 0.9 % 100 mL IVPB (0 g Intravenous Stopped 08/30/23 1621)  metroNIDAZOLE (FLAGYL) IVPB 500 mg (0 mg Intravenous Stopped 08/30/23 1621)  ondansetron (ZOFRAN) injection 4 mg (4 mg Intravenous Given 08/30/23 1222)  ipratropium-albuterol (DUONEB) 0.5-2.5 (3) MG/3ML nebulizer solution 3 mL (3 mLs Nebulization Given 08/30/23 1233)  methylPREDNISolone sodium succinate (SOLU-MEDROL) 125 mg/2 mL injection 125 mg (125 mg Intravenous Given 08/30/23 1223)  vancomycin (VANCOREADY) IVPB 1250 mg/250 mL (0 mg Intravenous Stopped 08/30/23 1621)  iohexol (OMNIPAQUE) 350 MG/ML injection 75 mL (75 mLs Intravenous Contrast Given 08/30/23 1410)  furosemide (LASIX) injection 40 mg (40 mg Intravenous Given 08/30/23 1835)    ED Course/ Medical Decision Making/ A&P Clinical Course as of 08/30/23 2230  Mon Aug 30, 2023  1551 CT Angio Chest PE W and/or Wo Contrast 1. No pulmonary embolus. 2. Chronic interstitial lung disease, unchanged from prior. 3. Moderate bronchial thickening with areas of mucoid impaction in the lower lobes, can be seen with bronchitis or reactive airways disease. 4. Small hiatal hernia with wall thickening of the distal esophagus, query esophagitis.   [HN]  1552  DG Sacrum/Coccyx No evidence of sacrococcygeal fracture. [HN]    Clinical Course User Index [HN] Loetta Rough, MD  83 year old female with a history of interstitial lung disease which she sees pulmonology, rheumatoid arthritis on Rituxan/steroids, asthma/obstructive lung disease, recent admission beginning of February with concern for influenza, hyponatremia and AKI, who presents with concern for shortness of breath, nausea, vomiting and hypoxia.  Differential diagnosis includes sepsis secondary to cellulitis, pneumonia, PE, pulmonary edema/CHF, COPD/asthma exacerbation, pneumothorax, worsening anemia or electrolyte abnormalities.  EKG completed and personally about interpreted by me shows normal sinus rhythm, no acute ST changes.   Chest x-ray completed and personally eval and interpreted by me shows diffuse interstitial findings similar to prior.   Labs completed and personally about interpreted by me showed normal troponin, normal BNP and have low suspicion for ACS or CHF, white blood cell count 16,000, no clinically significant electrolyte abnormalities.  She was given vancomycin, cefepime, and Flagyl for possible sepsis of unknown etiology with possible etiologies including pneumonia and cellulitis.  CT PE study ordered given recent hospitalization, dyspnea and hypoxia.  Plan for admission.  CT PE study shows chronic interstitial lung disease, bronchial thickening as seen with bronchitis.  Given solumedrol, duoneb.  Will admit for acute on chronic respiratory failure with hypoxia to 78 now on 4L, cellulitis with concern for sepsis.    -Also reports pain to tailbone after fall prior to previous admission, does bear weight but continued pain. XR without acute abnormalities. Possible may have occult tailbone fx or contusion      Final Clinical Impression(s) / ED Diagnoses Final diagnoses:  Acute hypoxic respiratory failure (HCC)  Bronchitis   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HCC)  Cellulitis of lower extremity, unspecified laterality    Rx / DC Orders ED Discharge Orders     None         Alvira Monday, MD 08/30/23 2230    Alvira Monday, MD 08/30/23 2233

## 2023-08-30 NOTE — Assessment & Plan Note (Signed)
Single episode reported this a.m.  No recurrence in the hospital.  Patient has known GERD.  At this time I will allow the patient diet and monitor.  Abdominal exam is benign.  She is not complaining of any abdominal pain.  Troponin is negative.  I will check a lipase.

## 2023-08-31 ENCOUNTER — Inpatient Hospital Stay (HOSPITAL_COMMUNITY): Payer: Medicare Other

## 2023-08-31 DIAGNOSIS — J9601 Acute respiratory failure with hypoxia: Principal | ICD-10-CM

## 2023-08-31 DIAGNOSIS — J40 Bronchitis, not specified as acute or chronic: Secondary | ICD-10-CM

## 2023-08-31 DIAGNOSIS — L03119 Cellulitis of unspecified part of limb: Secondary | ICD-10-CM | POA: Diagnosis not present

## 2023-08-31 DIAGNOSIS — J849 Interstitial pulmonary disease, unspecified: Secondary | ICD-10-CM | POA: Diagnosis not present

## 2023-08-31 DIAGNOSIS — I5021 Acute systolic (congestive) heart failure: Secondary | ICD-10-CM

## 2023-08-31 LAB — PROTIME-INR
INR: 1 (ref 0.8–1.2)
Prothrombin Time: 13.8 s (ref 11.4–15.2)

## 2023-08-31 LAB — CBC
HCT: 29.2 % — ABNORMAL LOW (ref 36.0–46.0)
Hemoglobin: 9.2 g/dL — ABNORMAL LOW (ref 12.0–15.0)
MCH: 28.8 pg (ref 26.0–34.0)
MCHC: 31.5 g/dL (ref 30.0–36.0)
MCV: 91.3 fL (ref 80.0–100.0)
Platelets: 376 10*3/uL (ref 150–400)
RBC: 3.2 MIL/uL — ABNORMAL LOW (ref 3.87–5.11)
RDW: 14.1 % (ref 11.5–15.5)
WBC: 9 10*3/uL (ref 4.0–10.5)
nRBC: 0 % (ref 0.0–0.2)

## 2023-08-31 LAB — BASIC METABOLIC PANEL
Anion gap: 6 (ref 5–15)
BUN: 16 mg/dL (ref 8–23)
CO2: 27 mmol/L (ref 22–32)
Calcium: 7.6 mg/dL — ABNORMAL LOW (ref 8.9–10.3)
Chloride: 101 mmol/L (ref 98–111)
Creatinine, Ser: 0.68 mg/dL (ref 0.44–1.00)
GFR, Estimated: >60 mL/min (ref >60)
Glucose, Bld: 157 mg/dL — ABNORMAL HIGH (ref 70–99)
Potassium: 3.7 mmol/L (ref 3.5–5.1)
Sodium: 134 mmol/L — ABNORMAL LOW (ref 135–145)

## 2023-08-31 LAB — PROCALCITONIN: Procalcitonin: 4.96 ng/mL

## 2023-08-31 LAB — ECHOCARDIOGRAM COMPLETE
Area-P 1/2: 4.06 cm2
S' Lateral: 2.8 cm
Weight: 2017.65 [oz_av]

## 2023-08-31 LAB — APTT: aPTT: 30 s (ref 24–36)

## 2023-08-31 MED ORDER — SODIUM CHLORIDE 3 % IN NEBU: 4.0000 mL | INHALATION_SOLUTION | Freq: Two times a day (BID) | RESPIRATORY_TRACT | Status: DC

## 2023-08-31 MED ORDER — KETOROLAC TROMETHAMINE 15 MG/ML IJ SOLN
15.0000 mg | Freq: Four times a day (QID) | INTRAMUSCULAR | Status: DC | PRN
  Administered 2023-08-31 – 2023-09-03 (×4): 15 mg via INTRAVENOUS
  Filled 2023-08-31 (×4): qty 1

## 2023-08-31 MED ORDER — GUAIFENESIN ER 600 MG PO TB12
1200.0000 mg | ORAL_TABLET | Freq: Two times a day (BID) | ORAL | Status: DC
  Administered 2023-08-31 – 2023-09-03 (×7): 1200 mg via ORAL
  Filled 2023-08-31 (×7): qty 2

## 2023-08-31 NOTE — Progress Notes (Signed)
Patient medicated with x 1 dose of Tordal this shift, with effective results, foods and fluids encouraged and tolerated well, receiving IV ATBs ATC without ASE, afebrile, improved communication noted, able to speak in complete sentences, turns in bed with assistance, BLE elevaed, in bed resting ,call light in reach

## 2023-08-31 NOTE — Progress Notes (Signed)
[facility]  PROGRESS NOTE  Stacey [name] ZOX:[id number] DOB: 1940/10/13 DOA: 08/30/2023 PCP: Stacey Northern, MD   Brief HPI:    83 y.o. female with medical history significant of discharged from the [facility] health system on 11/14/2023.  Patient has a known history of interstitial lung disease GERD rheumatoid arthritis, essential hypertension.  During her last hospitalization she had presented to the ER for persistent nausea and vomiting.  It was felt to be due to influenza A.  Patient was treated with Tamiflu and it seems that the conservative management was successful.  Patient was discharged to a nursing home facility.  Patient seems to have had at least 1 episode of vomiting this morning.  Subsequently patient was noted to be tachypneic at her nursing home facility and having increased work of breathing and difficulty speaking.  Patient was brought in by EMS.  Patient was noted to be hypoxic to 70% SpO2.  Requiring 2 L/min of supplementary oxygen.     Assessment/Plan:    Acute hypoxemic respiratory failure -CT chest showed chronic ILD unchanged from prior.  Moderate bronchial thickening with areas of mucoid impaction in the lower lobes -Check procalcitonin -Continue Mucomyst nebulizer -Start Mucinex 1200 mg p.o. twice daily, flutter valve -Currently on ceftriaxone, doxycycline and Flagyl for possible pneumonia  Nausea & vomiting -Resolved . Cellulitis -Right lower extremity cellulitis -She was on Keflex as outpatient -Currently on antibiotics as above   Low extremity edema -Follow echocardiogram results   Encephalopathy -Resolved, likely in setting of above   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 esophagitis Continue pantoprazole   Restrictive lung disease Patient has interstitial lung disease in the setting of known rheumatoid arthritis as well as GERD.  Patient takes Nintedanib at home - known to caue GI intolerance.  -Currently this medication is on hold; confirmed with Dr. Marchelle [name] no need to  give this medication in the hospital      Medications     acetylcysteine  4 mL Nebulization BID   amitriptyline  25 mg Oral QHS   cholecalciferol  1,000 Units Oral QPM   enoxaparin (LOVENOX) injection  40 mg Subcutaneous Q24H   feeding supplement  1 Bottle Oral BID   furosemide  40 mg Oral Daily   gabapentin  600 mg Oral QHS   ipratropium-albuterol  3 mL Nebulization BID   lidocaine  1 patch Transdermal Q24H   Nintedanib  1 capsule Oral BID   pantoprazole  40 mg Oral Daily   pravastatin  20 mg Oral Daily   sodium chloride flush  3 mL Intravenous Q12H   venlafaxine XR  75 mg Oral Daily     Data Reviewed:   CBG:  No results for input([name]): "GLUCAP" in the last 168 hours.  SpO2: 100 % O2 Flow Rate (L/min): 4 L/min    Vitals:   08/30/23 2007 08/31/23 0227 08/31/23 0500 08/31/23 0913  BP:  126/62    Pulse:  72    Resp:  18    Temp:  98 F (36.7 C)    TempSrc:  Oral    SpO2: 96% 100%  100%  Weight:   57.2 kg       Data Reviewed:  Basic Metabolic Panel: Recent Labs  Lab 08/26/23 1442 08/30/23 1122 08/30/23 1632 08/30/23 1825 08/31/23 0555  NA 137 131* 132*  --  134*  K 3.8 3.9 4.1  --  3.7  CL 100 94* 97*  --  101  CO2 30 25  --   --  27  GLUCOSE 80 96 116*  --  157*  BUN 9 13 11   --  16  CREATININE 0.66 0.77 0.80 0.78 0.68  CALCIUM 8.4 8.1*  --   --  7.6*    CBC: Recent Labs  Lab 08/26/23 1442 08/30/23 1122 08/30/23 1632 08/30/23 1825 08/31/23 0555  WBC 8.6 16.5*  --  14.9* 9.0  NEUTROABS 4.3 10.6*  --   --   --   HGB 11.2* 11.2* 10.2* 9.8* 9.2*  HCT 33.7* 35.1* 30.0* 31.3* 29.2*  MCV 88.6 90.5  --  91.3 91.3  PLT 454.0* 481*  --  390 376    LFT Recent Labs  Lab 08/26/23 1442 08/30/23 1122  AST 30 29  ALT 18 18  ALKPHOS 77 119  BILITOT 0.4 0.6  PROT 5.9* 5.7*  ALBUMIN 3.3* 2.8*     Antibiotics: Anti-infectives (From admission, onward)    Start     Dose/Rate Route Frequency Ordered Stop   08/30/23 1800  doxycycline  (VIBRAMYCIN) 100 mg in sodium chloride 0.9 % 250 mL IVPB        100 mg 125 mL/hr over 120 Minutes Intravenous Every 12 hours 08/30/23 1730     08/30/23 1800  cefTRIAXone (ROCEPHIN) 2 g in sodium chloride 0.9 % 100 mL IVPB        2 g 200 mL/hr over 30 Minutes Intravenous Every 24 hours 08/30/23 1747     08/30/23 1800  metroNIDAZOLE (FLAGYL) IVPB 500 mg        500 mg 100 mL/hr over 60 Minutes Intravenous Every 12 hours 08/30/23 1747     08/30/23 1145  vancomycin (VANCOREADY) IVPB 1250 mg/250 mL        1,250 mg 166.7 mL/hr over 90 Minutes Intravenous  Once 08/30/23 1144 08/30/23 1621   08/30/23 1130  ceFEPIme (MAXIPIME) 2 g in sodium chloride 0.9 % 100 mL IVPB        2 g 200 mL/hr over 30 Minutes Intravenous  Once 08/30/23 1127 08/30/23 1621   08/30/23 1130  metroNIDAZOLE (FLAGYL) IVPB 500 mg        500 mg 100 mL/hr over 60 Minutes Intravenous  Once 08/30/23 1127 08/30/23 1621   08/30/23 1130  vancomycin (VANCOCIN) IVPB 1000 mg/200 mL premix  Status:  Discontinued        1,000 mg 200 mL/hr over 60 Minutes Intravenous  Once 08/30/23 1127 08/30/23 1144        DVT prophylaxis: Lovenox  Code Status: DNR  Family Communication: Discussed with patient'[name] daughter at bedside   CONSULTS    Subjective   Denies shortness of breath.   Objective    Physical Examination:   General-appears in no acute distress Heart-S1-S2, regular, no murmur auscultated Lungs-bilateral rhonchi auscultated Abdomen-soft, nontender, no organomegaly Extremities-no edema in the lower extremities Neuro-alert, oriented x3, no focal deficit noted  Status is: Inpatient:             Stacey [name] Stacey [name]   [facility] If 7PM-7AM, please contact night-coverage at [email or web address], Office  [phone]   08/31/2023, 9:22 AM  LOS: 1 day

## 2023-08-31 NOTE — Consult Note (Signed)
WOC Nurse Consult Note: Reason for Consult: leg wound Patient has had fall PTA, from SNF with SOB Wound type: skin tear Pressure Injury POA: NA Measurement: see nursing flow sheets Wound bed: clean, skin flap intact  Drainage (amount, consistency, odor) serous Periwound: intact  Dressing procedure/placement/frequency: Cleanse LLE with saline, pat dry Cover with single layer of xeroform gauze, top with foam. Change every other day  Re consult if needed, will not follow at this time. Thanks  Hether Anselmo [employer], RN,CWOCN, CNS, CWON-AP [phone])

## 2023-08-31 NOTE — Progress Notes (Signed)
Paged provider   patient has missed OFEV for 2 day and is in a lot of pain stated she didn't feel like tylenol or lidocaine could help is there anything we could give her for her pain she hasn't been able to say what helped her in the past with the exception of she is overdue for her RA infusion thanks   Awaiting response

## 2023-08-31 NOTE — Plan of Care (Signed)
Reviewing plan of care.  Problem: Clinical Measurements: Goal: Ability to maintain clinical measurements within normal limits will improve Outcome: Progressing Goal: Will remain free from infection Outcome: Progressing Goal: Diagnostic test results will improve Outcome: Progressing Goal: Respiratory complications will improve Outcome: Progressing Goal: Cardiovascular complication will be avoided Outcome: Progressing   Problem: Activity: Goal: Risk for activity intolerance will decrease Outcome: Progressing   Problem: Nutrition: Goal: Adequate nutrition will be maintained Outcome: Progressing   Problem: Coping: Goal: Level of anxiety will decrease Outcome: Progressing   Problem: Elimination: Goal: Will not experience complications related to bowel motility Outcome: Progressing Goal: Will not experience complications related to urinary retention Outcome: Progressing   Problem: Pain Managment: Goal: General experience of comfort will improve and/or be controlled Outcome: Progressing   Problem: Safety: Goal: Ability to remain free from injury will improve Outcome: Progressing   Problem: Skin Integrity: Goal: Risk for impaired skin integrity will decrease Outcome: Progressing  Filiberto Pinks, RN

## 2023-08-31 NOTE — Plan of Care (Signed)
  Problem: Education: Goal: Knowledge of General Education information will improve Description: Including pain rating scale, medication(s)/side effects and non-pharmacologic comfort measures Outcome: Progressing   Problem: Clinical Measurements: Goal: Ability to maintain clinical measurements within normal limits will improve Outcome: Progressing Goal: Will remain free from infection Outcome: Progressing Goal: Diagnostic test results will improve Outcome: Progressing   Problem: Pain Managment: Goal: General experience of comfort will improve and/or be controlled Outcome: Progressing

## 2023-08-31 NOTE — TOC Progression Note (Addendum)
Transition of Care [facility]) - Progression Note    Patient Details  Name: Stacey [name] MRN: [id number] Date of Birth: 1940/11/18  Transition of Care [facility] [place]) CM/SW Contact  Beckie Busing, RN Phone Number:[phone]  08/31/2023, 8:47 AM  Clinical Narrative:    [facility] acknowledges consult for SNF. Per documentation it looks life patient arrived from SNF. TOC will follow.         Expected Discharge Plan and Services                                               Social Determinants of Health (SDOH) Interventions SDOH Screenings   Food Insecurity: No Food Insecurity (08/30/2023)  Housing: Low Risk  (08/30/2023)  Transportation Needs: No Transportation Needs (08/30/2023)  Utilities: Not At Risk (08/30/2023)  Depression (PHQ2-9): Low Risk  (10/24/2020)  Social Connections: Moderately Integrated (08/30/2023)  Tobacco Use: Low Risk  (08/30/2023)    Readmission Risk Interventions    08/16/2023    2:36 PM  Readmission Risk Prevention Plan  Transportation Screening Complete  PCP or Specialist Appt within 5-7 Days Complete  Home Care Screening Complete  Medication Review (RN CM) Complete

## 2023-08-31 NOTE — Progress Notes (Addendum)
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   Lichen sclerosus 05/26/2006   Asymptomatic   Low iron    Mitral valve prolapse    Osteoarthritis    Osteoporosis    Pneumonia    Post herpetic neuralgia    Rheumatoid arthritis(714.0)    Yeast infection    Past Surgical History:  Past Surgical History:  Procedure Laterality Date   CHOLECYSTECTOMY  2011   IR ANGIO INTRA EXTRACRAN SEL COM CAROTID INNOMINATE BILAT MOD SED  06/18/2020   IR ANGIO VERTEBRAL SEL SUBCLAVIAN INNOMINATE UNI R MOD SED  06/18/2020   IR ANGIO VERTEBRAL SEL VERTEBRAL UNI L MOD SED  06/18/2020   WISDOM TOOTH EXTRACTION     HPI:  83 yo female adm to [facility] with n/v - pt with recent flu A diagnosis.  Pt with PMH + for reflux as source of pulmonary disease, has h.o ILD, research subject, Chest imaging showed No suspicious hilar adenopathy. Small hiatal hernia with wall thickening of the distal esophagus. MBS 03/2018, normal, cough before and after MBS - ? Reflux.  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 Mild smooth narrowing distal esophagus. 13 mm barium tablet becomes temporarily lodged at this level but clears immediately with subsequent swallowing. Spontaneous reflux to level of the midthoracic esophagus when patient is placed in the supine position. 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 Pt current medication list includes 40 mg protonix and zofran - she endorses being on reflux medication prior to admission. Imaging concerning  for possible lower lobe mucoid impaction - may be reactive airway or bronchitis.  Pt also with h/o C5-C6 and C6-C7 mod deg disease.  Pt admits to some dysphagia - causing her to cough with food and liquids - without any rhyme or reason. Pt denies any reflux symptoms.  BSE ordered by ED MD.    Assessment / Plan / Recommendation  Clinical Impression  Limited evaluation completed due to pt's gross amount of pain when she was repositioned upright in bed.  No focal CN deficits, oral care is xerostomia and she is quite erythemic. Pt's voice is hoarse - she reports due to coughing and mouth is xerostomic with erythema but pt denies odynophagia.  She was elevated in bed side-lying due to her gross amount of pain after SLP/NT repositioned pt upright on her back.  Pt then observed with intake including water, apple juice, Ensure and fruit.  Overt immediate prolonged coughing noted with sequential boluses of water - which mimics pt's dysphagia symptoms. Small single boluses of water were tolerated without indication of aspiration.  Suspect premature spillage into open airway as souce.   Applejuice, Ensure  via straw and fruit were tolerated without indication of aspiration nor significant indication of dysphagia.   Advised her and her daughter to increased aspiration risk with her known reflux, dysmotlity and impaired mobility - and reviewed aspiration/esophageal precautions. Daughter reports pt consumes very small amounts - and Ensure has been ordered for between meals per RN.  Will follow up x1 given limited amount of po observed due to pt having consumed breakfast and having pain.Recommend meds with puree or Ensures - starting with water.  Pt and daughter agreeable to plan. Pt also appears to be having extrapyramidal movement, ? Tremoring? - ? due to current illness - daughter and pt noted this as well. SLP Visit Diagnosis: Dysphagia, unspecified (R13.10)    Aspiration Risk  Mild aspiration risk;Moderate aspiration  risk    Diet Recommendation Regular;Thin liquid    Liquid Administration via: Cup;Straw Medication Administration:  (try with Ensure) Supervision: Comment (assist as needed -) Compensations: Minimize environmental distractions;Slow rate;Small sips/bites (start intake with lqiuids) Postural Changes: Seated upright at 90 degrees;Remain upright for at least 30 minutes after po intake    Other  Recommendations Oral Care Recommendations: Oral care BID    Recommendations for follow up therapy are one component of a multi-disciplinary discharge planning process, led by the attending physician.  Recommendations may be updated based on patient status, additional functional criteria and insurance authorization.  Follow up Recommendations    TBD    Assistance Recommended at Discharge Full   Functional Status Assessment Patient has had a recent decline in their functional status and demonstrates the ability to make significant improvements in function in a reasonable and predictable amount of time.  Frequency and Duration min 1 x/week  1 week       Prognosis Prognosis for improved oropharyngeal function: Fair Barriers to Reach Goals: Time post onset      Swallow Study   General Date of Onset: 08/31/23 HPI: 83 yo female adm to [facility] with n/v - pt with recent flu A diagnosis.  Pt with PMH + for reflux as source of pulmonary disease, has h.o ILD, research subject, Chest imaging showed No suspicious hilar adenopathy. Small hiatal hernia with wall thickening of the distal esophagus. MBS 03/2018, normal, cough before and after MBS - ? Reflux.  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 Mild smooth narrowing distal esophagus. 13 mm barium tablet becomes temporarily lodged at this level but clears immediately with subsequent swallowing. Spontaneous reflux to level of the midthoracic esophagus when patient is placed in the supine position. Mild impression upon the upper thoracic esophagus by aberrant right  subclavian artery. Pt current medication list includes 40 mg protonix and zofran - she endorses being on reflux medication prior to admission. Imaging concerning for possible lower lobe mucoid impaction - may be reactive airway or bronchitis.  Pt also with h/o C5-C6 and C6-C7 mod deg disease.  Pt admits to some dysphagia - causing her to cough with food and liquids - without any rhyme or reason. Pt denies any reflux symptoms.  BSE ordered by ED MD. Type of Study: Bedside Swallow Evaluation Previous Swallow Assessment: see HPI Diet Prior to this Study: Regular;Thin liquids (Level 0) Temperature Spikes Noted: Yes (febrile last evening) Respiratory Status: Nasal cannula (was just on a bit of oxygen this weekend prior to admission) History of Recent Intubation: No Behavior/Cognition: Alert;Cooperative;Pleasant mood Oral Cavity Assessment: Erythema Oral Care Completed by SLP: No Oral Cavity - Dentition: Adequate natural dentition Vision: Functional for self-feeding Self-Feeding Abilities: Able to feed self Patient Positioning: Other (comment) (elevated side lying due to pain) Baseline Vocal Quality: Hoarse Volitional Cough: Other (Comment) (DNT due to pt's pain, but reflexive cough is strong) Volitional Swallow: Unable to elicit (due to xerostomia)    Oral/Motor/Sensory Function Overall Oral Motor/Sensory Function: Within functional limits   Ice Chips Ice chips: Not tested  Thin Liquid Thin Liquid: Impaired Presentation: Straw Pharyngeal  Phase Impairments: Cough - Immediate Other Comments: small single boluses prevented overt coughing with thin water - and no furhter incidents noted during session - but pt only consumed minimal amounts    Nectar Thick Nectar Thick Liquid: Within functional limits Presentation: Self Fed;Straw   Honey Thick Honey Thick Liquid: Not tested   Puree Puree: Not tested   Solid     Solid: Within functional limits Other Comments: bite of fruit, adequate  mastication and oral clearance      Chales Abrahams 08/31/2023,11:31 AM Rolena Infante, MS [facility] SLP Acute Rehab Services Office [phone]

## 2023-09-01 DIAGNOSIS — R0902 Hypoxemia: Secondary | ICD-10-CM | POA: Diagnosis not present

## 2023-09-01 LAB — CBC
HCT: 28.2 % — ABNORMAL LOW (ref 36.0–46.0)
Hemoglobin: 8.9 g/dL — ABNORMAL LOW (ref 12.0–15.0)
MCH: 29 pg (ref 26.0–34.0)
MCHC: 31.6 g/dL (ref 30.0–36.0)
MCV: 91.9 fL (ref 80.0–100.0)
Platelets: 401 10*3/uL — ABNORMAL HIGH (ref 150–400)
RBC: 3.07 MIL/uL — ABNORMAL LOW (ref 3.87–5.11)
RDW: 14.5 % (ref 11.5–15.5)
WBC: 16.5 10*3/uL — ABNORMAL HIGH (ref 4.0–10.5)
nRBC: 0 % (ref 0.0–0.2)

## 2023-09-01 LAB — BASIC METABOLIC PANEL
Anion gap: 5 (ref 5–15)
BUN: 18 mg/dL (ref 8–23)
CO2: 28 mmol/L (ref 22–32)
Calcium: 7.5 mg/dL — ABNORMAL LOW (ref 8.9–10.3)
Chloride: 104 mmol/L (ref 98–111)
Creatinine, Ser: 0.84 mg/dL (ref 0.44–1.00)
GFR, Estimated: >60 mL/min (ref >60)
Glucose, Bld: 93 mg/dL (ref 70–99)
Potassium: 3.3 mmol/L — ABNORMAL LOW (ref 3.5–5.1)
Sodium: 137 mmol/L (ref 135–145)

## 2023-09-01 MED ORDER — DOXYCYCLINE HYCLATE 100 MG PO TABS
100.0000 mg | ORAL_TABLET | Freq: Two times a day (BID) | ORAL | Status: DC
  Administered 2023-09-01 – 2023-09-03 (×4): 100 mg via ORAL
  Filled 2023-09-01 (×7): qty 1

## 2023-09-01 MED ORDER — MELATONIN 5 MG PO TABS
5.0000 mg | ORAL_TABLET | Freq: Every day | ORAL | Status: DC
  Administered 2023-09-01 – 2023-09-02 (×2): 5 mg via ORAL
  Filled 2023-09-01 (×2): qty 1

## 2023-09-01 MED ORDER — POTASSIUM CHLORIDE CRYS ER 20 MEQ PO TBCR
40.0000 meq | EXTENDED_RELEASE_TABLET | Freq: Once | ORAL | Status: AC
  Administered 2023-09-01: 40 meq via ORAL
  Filled 2023-09-01: qty 2

## 2023-09-01 MED ORDER — METHYLPREDNISOLONE SODIUM SUCC 40 MG IJ SOLR
40.0000 mg | Freq: Two times a day (BID) | INTRAMUSCULAR | Status: DC
Start: 2023-09-01 — End: 2023-09-02
  Administered 2023-09-01 – 2023-09-02 (×2): 40 mg via INTRAVENOUS
  Filled 2023-09-01 (×2): qty 1

## 2023-09-01 NOTE — Progress Notes (Addendum)
PROGRESS NOTE  Stacey [name]  HYQ:[id number] DOB: Apr 24, 1941 DOA: 08/30/2023 PCP: Eloisa Northern, MD   Brief Narrative: Patient is a 83 year old female with history of ILD, GERD, rheumatoid arthritis, hypertension who presented here with complaint of persistent nausea, vomiting.  She was recently discharged from here to a nursing facility on 08/17/2023 after she was managed for acute hypoxic respiratory failure secondary to influenza A, AKI.  Patient found to be hypoxic on arrival, needing oxygen.  Initial chest x-ray showed features of ILD, no acute findings.  CT chest showed features of bronchitis with bronchial thickening.  Started on Mucomyst, bronchodilators.  Also started on antibiotics for possible community-acquired pneumonia.  Added steroids today.  Assessment & Plan:  Principal Problem:   Hypoxia Active Problems:   Nausea & vomiting   Restrictive lung disease   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 esophagitis   Encephalopathy   Fluid overload   Cellulitis  Acute hypoxic respiratory failure/bronchitis/suspected CAP: Not on oxygen at baseline.  Found to be hypoxic on presentation.  On 4 L this mrng.Chest imagings as above.  Continue Mucomyst, Mucinex, antibiotics.  Continue to wean the oxygen. Has leukocytosis, elevated procalcitonin.  Cultures have not shown any growth.  Leukocytosis could have been contributed from steroids.  History of ILD: Follows with Dr. Marchelle Gearing for ILD, rheumatoid arthritis.  Takes nintedanib, it has been known to cause GI intolerance.  Currently this medication is on hold after discussion with Dr. Monica Becton. Starting IV Solu-Medrol.  Has bilateral basal crackles.  Nausea/vomiting: Resolved  Hypokalemia: supplemented potassium  Right lower extremity cellulitis: Continue current antibiotics.  Was taking Keflex as an outpatient.  Wound care following.  Lower extremity edema: Echo showed EF of 65 to 70%, grade 2 diastolic dysfunction.  Continue Lasix at current dose            DVT prophylaxis:enoxaparin (LOVENOX) injection 40 mg Start: 08/31/23 0800 SCDs Start: 08/30/23 1712     Code Status: Do not attempt resuscitation (DNR) PRE-ARREST INTERVENTIONS DESIRED  Family Communication: Daughter at bedside  Patient status:Inpatient  Patient is from :SNF  Anticipated discharge to:SNF  Estimated DC date:1-2 days   Consultants: None  Procedures:None  Antimicrobials:  Anti-infectives (From admission, onward)    Start     Dose/Rate Route Frequency Ordered Stop   09/01/23 2000  doxycycline (VIBRA-TABS) tablet 100 mg        100 mg Oral 2 times daily 09/01/23 0833 09/04/23 0759   08/30/23 1800  doxycycline (VIBRAMYCIN) 100 mg in sodium chloride 0.9 % 250 mL IVPB  Status:  Discontinued        100 mg 125 mL/hr over 120 Minutes Intravenous Every 12 hours 08/30/23 1730 09/01/23 0833   08/30/23 1800  cefTRIAXone (ROCEPHIN) 2 g in sodium chloride 0.9 % 100 mL IVPB        2 g 200 mL/hr over 30 Minutes Intravenous Every 24 hours 08/30/23 1747     08/30/23 1800  metroNIDAZOLE (FLAGYL) IVPB 500 mg        500 mg 100 mL/hr over 60 Minutes Intravenous Every 12 hours 08/30/23 1747     08/30/23 1145  vancomycin (VANCOREADY) IVPB 1250 mg/250 mL        1,250 mg 166.7 mL/hr over 90 Minutes Intravenous  Once 08/30/23 1144 08/30/23 1621   08/30/23 1130  ceFEPIme (MAXIPIME) 2 g in sodium chloride 0.9 % 100 mL IVPB        2 g 200 mL/hr over 30 Minutes Intravenous  Once 08/30/23 1127 08/30/23 1621  08/30/23 1130  metroNIDAZOLE (FLAGYL) IVPB 500 mg        500 mg 100 mL/hr over 60 Minutes Intravenous  Once 08/30/23 1127 08/30/23 1621   08/30/23 1130  vancomycin (VANCOCIN) IVPB 1000 mg/200 mL premix  Status:  Discontinued        1,000 mg 200 mL/hr over 60 Minutes Intravenous  Once 08/30/23 1127 08/30/23 1144       Subjective:  Patient seen and examined at bedside today.  Hemodynamically stable lying in bed.  Looks weak and deconditioned.  Complains of severe weakness.   No nausea or vomiting or abdominal pain today.  On fluids of oxygen per minute.  Still having cough.  Objective: Vitals:   08/31/23 1933 08/31/23 2032 09/01/23 0548 09/01/23 0923  BP:  121/60 (!) 151/66   Pulse:  87 79   Resp:  18 18   Temp:  98.4 F (36.9 C) 98 F (36.7 C)   TempSrc:  Oral Oral   SpO2: 98% 99% 100% 99%  Weight:      Height:        Intake/Output Summary (Last 24 hours) at 09/01/2023 1118 Last data filed at 09/01/2023 0751 Gross per 24 hour  Intake 862.22 ml  Output 700 ml  Net 162.22 ml   Filed Weights   08/31/23 0227 08/31/23 0500  Weight: 57.2 kg 57.2 kg    Examination:  General exam: Lying in bed, overall comfortable, chronically ill looking HEENT: PERRL Respiratory system: Bilateral basal crackles, no wheezing Cardiovascular system: S1 & S2 heard, RRR.  Gastrointestinal system: Abdomen is nondistended, soft and nontender. Central nervous system: Alert and oriented Extremities: No edema, no clubbing ,no cyanosis Skin: No rashes, no ulcers,no icterus     Data Reviewed: I have personally reviewed following labs and imaging studies  CBC: Recent Labs  Lab 08/26/23 1442 08/30/23 1122 08/30/23 1632 08/30/23 1825 08/31/23 0555 09/01/23 0604  WBC 8.6 16.5*  --  14.9* 9.0 16.5*  NEUTROABS 4.3 10.6*  --   --   --   --   HGB 11.2* 11.2* 10.2* 9.8* 9.2* 8.9*  HCT 33.7* 35.1* 30.0* 31.3* 29.2* 28.2*  MCV 88.6 90.5  --  91.3 91.3 91.9  PLT 454.0* 481*  --  390 376 401*   Basic Metabolic Panel: Recent Labs  Lab 08/26/23 1442 08/30/23 1122 08/30/23 1632 08/30/23 1825 08/31/23 0555 09/01/23 0604  NA 137 131* 132*  --  134* 137  K 3.8 3.9 4.1  --  3.7 3.3*  CL 100 94* 97*  --  101 104  CO2 30 25  --   --  27 28  GLUCOSE 80 96 116*  --  157* 93  BUN 9 13 11   --  16 18  CREATININE 0.66 0.77 0.80 0.78 0.68 0.84  CALCIUM 8.4 8.1*  --   --  7.6* 7.5*     Recent Results (from the past 240 hours)  Blood Culture (routine x 2)     Status: None  (Preliminary result)   Collection Time: 08/30/23 11:30 AM   Specimen: BLOOD  Result Value Ref Range Status   Specimen Description   Final    BLOOD LEFT ANTECUBITAL Performed at [facility], [address], [place], [place] [address]    Special Requests   Final    BOTTLES DRAWN AEROBIC AND ANAEROBIC Blood Culture adequate volume Performed at [facility], [address], [place], [place] [address]    Culture  Final    NO GROWTH 2 DAYS Performed at [facility], [address], [place], [place] [address]    Report Status PENDING  Incomplete  Blood Culture (routine x 2)     Status: None (Preliminary result)   Collection Time: 08/30/23 11:34 AM   Specimen: BLOOD RIGHT ARM  Result Value Ref Range Status   Specimen Description   Final    BLOOD RIGHT ARM Performed at [facility], [address], [place], [place] [address]    Special Requests   Final    BOTTLES DRAWN AEROBIC AND ANAEROBIC Blood Culture adequate volume Performed at [facility], [address], [place], [place] [address]    Culture   Final    NO GROWTH 2 DAYS Performed at [facility], [address], [place], [place] [address]    Report Status PENDING  Incomplete  Resp panel by RT-PCR (RSV, Flu A&B, Covid) Anterior Nasal Swab     Status: None   Collection Time: 08/30/23  4:17 PM   Specimen: Anterior Nasal Swab  Result Value Ref Range Status   SARS Coronavirus 2 by RT PCR NEGATIVE NEGATIVE Final    Comment: (NOTE) SARS-CoV-2 target nucleic acids are NOT DETECTED.  The SARS-CoV-2 RNA is generally detectable in upper respiratory specimens during the acute phase of infection. The lowest concentration of SARS-CoV-2 viral copies this assay can detect is 138 copies/mL. A negative result does not preclude SARS-Cov-2 infection and should not be used as the sole basis for treatment or other patient management decisions. A negative result may  occur with  improper specimen collection/handling, submission of specimen other than nasopharyngeal swab, presence of viral mutation(s) within the areas targeted by this assay, and inadequate number of viral copies(<138 copies/mL). A negative result must be combined with clinical observations, patient history, and epidemiological information. The expected result is Negative.  Fact Sheet for Patients:  BloggerCourse.com  Fact Sheet for Healthcare Providers:  SeriousBroker.it  This test is no t yet approved or cleared by the [place] FDA and  has been authorized for detection and/or diagnosis of SARS-CoV-2 by FDA under an Emergency Use Authorization (EUA). This EUA will remain  in effect (meaning this test can be used) for the duration of the COVID-19 declaration under Section 564(b)(1) of the Act, 21 U.S.C.section 360bbb-3(b)(1), unless the authorization is terminated  or revoked sooner.       Influenza A by PCR NEGATIVE NEGATIVE Final   Influenza B by PCR NEGATIVE NEGATIVE Final    Comment: (NOTE) The Xpert Xpress SARS-CoV-2/FLU/RSV plus assay is intended as an aid in the diagnosis of influenza from Nasopharyngeal swab specimens and should not be used as a sole basis for treatment. Nasal washings and aspirates are unacceptable for Xpert Xpress SARS-CoV-2/FLU/RSV testing.  Fact Sheet for Patients: BloggerCourse.com  Fact Sheet for Healthcare Providers: SeriousBroker.it  This test is not yet approved or cleared by the [place] FDA and has been authorized for detection and/or diagnosis of SARS-CoV-2 by FDA under an Emergency Use Authorization (EUA). This EUA will remain in effect (meaning this test can be used) for the duration of the COVID-19 declaration under Section 564(b)(1) of the Act, 21 U.S.C. section 360bbb-3(b)(1), unless the authorization is terminated  or revoked.     Resp Syncytial Virus by PCR NEGATIVE NEGATIVE Final    Comment: (NOTE) Fact Sheet for Patients: BloggerCourse.com  Fact Sheet for Healthcare Providers: SeriousBroker.it  This test is not yet  approved or cleared by the [place] and has been authorized for detection and/or diagnosis of SARS-CoV-2 by FDA under an Emergency Use Authorization (EUA). This EUA will remain in effect (meaning this test can be used) for the duration of the COVID-19 declaration under Section 564(b)(1) of the Act, 21 U.S.C. section 360bbb-3(b)(1), unless the authorization is terminated or revoked.  Performed at [facility], [address] [place], [place] [address]      Radiology Studies: ECHOCARDIOGRAM COMPLETE Result Date: 08/31/2023    ECHOCARDIOGRAM REPORT   Patient Name:   Stacey [name] Date of Exam: 08/31/2023 Medical Rec #:  [id number]         Height:       66.0 in Accession #:    [id number]        Weight:       126.1 lb Date of Birth:  1940-07-18         BSA:          1.644 m Patient Age:    82 years          BP:           126/62 mmHg Patient Gender: F                 HR:           85 bpm. Exam Location:  Inpatient Procedure: 2D Echo, Cardiac Doppler and Color Doppler (Both Spectral and Color            Flow Doppler were utilized during procedure). Indications:    CHF-Acute Systolic I50.21  History:        Patient has prior history of Echocardiogram examinations, most                 recent 12/18/2018. Signs/Symptoms:Dyspnea.  Sonographer:    Webb Laws Referring Phys: [id number] HERSH GOEL IMPRESSIONS  1. Left ventricular ejection fraction, by estimation, is 65 to 70%. The left ventricle has normal function. The left ventricle has no regional wall motion abnormalities. Left ventricular diastolic parameters are consistent with Grade II diastolic dysfunction (pseudonormalization).  2. Right ventricular systolic  function is normal. The right ventricular size is normal.  3. Left atrial size was mildly dilated.  4. The mitral valve is normal in structure. Trivial mitral valve regurgitation. No evidence of mitral stenosis.  5. Isolated calcification of the NCC Leaflet. The aortic valve is tricuspid. There is mild calcification of the aortic valve. Aortic valve regurgitation is not visualized. Aortic valve sclerosis is present, with no evidence of aortic valve stenosis.  6. Aortic dilatation noted. There is mild dilatation of the ascending aorta, measuring 40 mm.  7. The inferior vena cava is dilated in size with >50% respiratory variability, suggesting right atrial pressure of 8 mmHg. Comparison(s): No significant change from prior study. FINDINGS  Left Ventricle: Left ventricular ejection fraction, by estimation, is 65 to 70%. The left ventricle has normal function. The left ventricle has no regional wall motion abnormalities. Strain imaging was not performed. The left ventricular internal cavity  size was normal in size. There is no left ventricular hypertrophy. Left ventricular diastolic parameters are consistent with Grade II diastolic dysfunction (pseudonormalization). Right Ventricle: The right ventricular size is normal. No increase in right ventricular wall thickness. Right ventricular systolic function is normal. Left Atrium: Left atrial size was mildly dilated. Right Atrium: Right atrial size was normal in size. Prominent Eustachian valve. Pericardium: There is no evidence of pericardial effusion.  Mitral Valve: The mitral valve is normal in structure. Trivial mitral valve regurgitation. No evidence of mitral valve stenosis. Tricuspid Valve: The tricuspid valve is normal in structure. Tricuspid valve regurgitation is mild . No evidence of tricuspid stenosis. Aortic Valve: Isolated calcification of the NCC Leaflet. The aortic valve is tricuspid. There is mild calcification of the aortic valve. Aortic valve  regurgitation is not visualized. Aortic valve sclerosis is present, with no evidence of aortic valve stenosis. Pulmonic Valve: The pulmonic valve was normal in structure. Pulmonic valve regurgitation is not visualized. No evidence of pulmonic stenosis. Aorta: Aortic dilatation noted. There is mild dilatation of the ascending aorta, measuring 40 mm. Venous: The inferior vena cava is dilated in size with greater than 50% respiratory variability, suggesting right atrial pressure of 8 mmHg. IAS/Shunts: The atrial septum is grossly normal. Additional Comments: 3D imaging was not performed.  LEFT VENTRICLE PLAX 2D LVIDd:         4.50 cm   Diastology LVIDs:         2.80 cm   LV e' medial:    5.33 cm/s LV PW:         1.00 cm   LV E/e' medial:  18.9 LV IVS:        0.90 cm   LV e' lateral:   8.59 cm/s LVOT diam:     1.90 cm   LV E/e' lateral: 11.8 LV SV:         70 LV SV Index:   43 LVOT Area:     2.84 cm  RIGHT VENTRICLE             IVC RV Basal diam:  3.20 cm     IVC diam: 2.20 cm RV S prime:     14.70 cm/s TAPSE (M-mode): 2.0 cm LEFT ATRIUM             Index        RIGHT ATRIUM           Index LA diam:        4.00 cm 2.43 cm/m   RA Area:     12.10 cm LA Vol (A2C):   59.5 ml 36.20 ml/m  RA Volume:   23.90 ml  14.54 ml/m LA Vol (A4C):   46.4 ml 28.23 ml/m LA Biplane Vol: 55.6 ml 33.82 ml/m  AORTIC VALVE LVOT Vmax:   113.00 cm/s LVOT Vmean:  87.000 cm/s LVOT VTI:    0.247 m  AORTA Ao Root diam: 3.00 cm Ao Asc diam:  3.90 cm MITRAL VALVE                TRICUSPID VALVE MV Area (PHT): 4.06 cm     TR Peak grad:   35.8 mmHg MV Decel Time: 187 msec     TR Vmax:        299.00 cm/s MV E velocity: 101.00 cm/s MV A velocity: 100.00 cm/s  SHUNTS MV E/A ratio:  1.01         Systemic VTI:  0.25 m                             Systemic Diam: 1.90 cm Riley Lam MD Electronically signed by Riley Lam MD Signature Date/Time: 08/31/2023/5:28:23 PM    Final    DG Sacrum/Coccyx Result Date: 08/30/2023 CLINICAL DATA:   Recent fall, pain. EXAM: SACRUM AND COCCYX - 2+ VIEW COMPARISON:  Reformats from 06/21/2023 CT  FINDINGS: No evidence of acute fracture. The cortical margins of the sacrum and coccyx are intact. Sacral ala are maintained. No sacroiliac diastasis. Excreted IV contrast within the distal ureters from prior chest CTA. IMPRESSION: No evidence of sacrococcygeal fracture. Electronically Signed   By: Narda Rutherford M.D.   On: 08/30/2023 15:26   CT Angio Chest PE W and/or Wo Contrast Result Date: 08/30/2023 CLINICAL DATA:  Shortness of breath. EXAM: CT ANGIOGRAPHY CHEST WITH CONTRAST TECHNIQUE: Multidetector CT imaging of the chest was performed using the standard protocol during bolus administration of intravenous contrast. Multiplanar CT image reconstructions and MIPs were obtained to evaluate the vascular anatomy. RADIATION DOSE REDUCTION: This exam was performed according to the departmental dose-optimization program which includes automated exposure control, adjustment of the mA and/or kV according to patient size and/or use of iterative reconstruction technique. CONTRAST:  75mL OMNIPAQUE IOHEXOL 350 MG/ML SOLN COMPARISON:  Radiograph earlier today.  Chest CT 06/21/2023 FINDINGS: Cardiovascular: There are no filling defects within the pulmonary arteries to suggest pulmonary embolus. Heart is upper normal in size. There are coronary artery calcifications. Diffuse aortic atherosclerosis. 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 No pericardial effusion. Mediastinum/Nodes: No enlarged mediastinal lymph nodes. No suspicious hilar adenopathy. Small hiatal hernia with wall thickening of the distal esophagus. Lungs/Pleura: Chronic interstitial lung disease with subpleural reticulation and ground-glass. There is biapical pleuroparenchymal scarring. Moderate bronchial thickening with areas of mucoid impaction in the lower lobes. No pleural effusion. Small calcified granuloma in the right upper lobe,  needing no further imaging follow-up. Upper Abdomen: No acute findings. Cholecystectomy. Mild chronic mid lower thoracic compression deformities. Remote bilateral rib fractures. There are no acute or suspicious osseous abnormalities. Musculoskeletal: Review of the MIP images confirms the above findings. IMPRESSION: 1. No pulmonary embolus. 2. Chronic interstitial lung disease, unchanged from prior. 3. Moderate bronchial thickening with areas of mucoid impaction in the lower lobes, can be seen with bronchitis or reactive airways disease. 4. Small hiatal hernia with wall thickening of the distal esophagus, query esophagitis. Aortic Atherosclerosis (ICD10-I70.0). Electronically Signed   By: Narda Rutherford M.D.   On: 08/30/2023 15:25   DG Chest Port 1 View Result Date: 08/30/2023 CLINICAL DATA:  Questionable sepsis - evaluate for abnormality Shortness of breath. EXAM: PORTABLE CHEST 1 VIEW COMPARISON:  Radiograph 08/15/2023. Subsequent chest CT available at time of radiograph interpretation. FINDINGS: Stable heart size and mediastinal contours. Aortic atherosclerosis. Sequela of chronic interstitial lung disease with subpleural reticulation. No superimposed acute airspace disease. No pleural fluid or pneumothorax. IMPRESSION: Sequela of chronic interstitial lung disease. No superimposed acute finding. Electronically Signed   By: Narda Rutherford M.D.   On: 08/30/2023 15:19    Scheduled Meds:  acetylcysteine  4 mL Nebulization BID   amitriptyline  25 mg Oral QHS   cholecalciferol  1,000 Units Oral QPM   doxycycline  100 mg Oral BID   enoxaparin (LOVENOX) injection  40 mg Subcutaneous Q24H   feeding supplement  1 Bottle Oral BID   furosemide  40 mg Oral Daily   gabapentin  600 mg Oral QHS   guaiFENesin  1,200 mg Oral BID   ipratropium-albuterol  3 mL Nebulization BID   lidocaine  1 patch Transdermal Q24H   pantoprazole  40 mg Oral Daily   pravastatin  20 mg Oral Daily   sodium chloride flush  3 mL  Intravenous Q12H   venlafaxine XR  75 mg Oral Daily   Continuous Infusions:  cefTRIAXone (ROCEPHIN)  IV  2 g (08/31/23 1827)   metronidazole 500 mg (09/01/23 0528)     LOS: 2 days   Burnadette Pop, MD [facility] P2/19/2025, 11:18 AM

## 2023-09-01 NOTE — Telephone Encounter (Addendum)
Will d/w with patient and/or [facility]ology regarding completing rituximab infusions at [facility].  If they will still be under [facility] provider, they will have to fax orders.  Per chart review, patient is on Truxima acetaminophen (TYLENOL) tablet 650 mg 1 12/22/2022    diphenhydrAMINE (BENADRYL) capsule 25 mg 1 12/22/2022    methylPREDNISolone (PF) (SOLU-Medrol) 125 mg/2 mL injection 62.5 mg 1 12/22/2022    riTUXimab-abbs (TRUXIMA) 1,000 mg in sodium chloride 0.9% 1,000 mL chemo infusion      Chesley Mires, PharmD, MPH, BCPS, CPP Clinical Pharmacist (Rheumatology and Pulmonology)

## 2023-09-02 DIAGNOSIS — R0902 Hypoxemia: Secondary | ICD-10-CM | POA: Diagnosis not present

## 2023-09-02 LAB — CBC
HCT: 28.4 % — ABNORMAL LOW (ref 36.0–46.0)
Hemoglobin: 9 g/dL — ABNORMAL LOW (ref 12.0–15.0)
MCH: 28.9 pg (ref 26.0–34.0)
MCHC: 31.7 g/dL (ref 30.0–36.0)
MCV: 91.3 fL (ref 80.0–100.0)
Platelets: 409 10*3/uL — ABNORMAL HIGH (ref 150–400)
RBC: 3.11 MIL/uL — ABNORMAL LOW (ref 3.87–5.11)
RDW: 14.4 % (ref 11.5–15.5)
WBC: 14.3 10*3/uL — ABNORMAL HIGH (ref 4.0–10.5)
nRBC: 0 % (ref 0.0–0.2)

## 2023-09-02 LAB — BASIC METABOLIC PANEL
Anion gap: 9 (ref 5–15)
BUN: 15 mg/dL (ref 8–23)
CO2: 28 mmol/L (ref 22–32)
Calcium: 7.7 mg/dL — ABNORMAL LOW (ref 8.9–10.3)
Chloride: 101 mmol/L (ref 98–111)
Creatinine, Ser: 0.54 mg/dL (ref 0.44–1.00)
GFR, Estimated: >60 mL/min (ref >60)
Glucose, Bld: 126 mg/dL — ABNORMAL HIGH (ref 70–99)
Potassium: 3.7 mmol/L (ref 3.5–5.1)
Sodium: 138 mmol/L (ref 135–145)

## 2023-09-02 MED ORDER — PREDNISONE 20 MG PO TABS
40.0000 mg | ORAL_TABLET | Freq: Every day | ORAL | Status: DC
  Administered 2023-09-03: 40 mg via ORAL
  Filled 2023-09-02: qty 2

## 2023-09-02 NOTE — Plan of Care (Signed)

## 2023-09-02 NOTE — [place] FL2 (Signed)
[place] MEDICAID FL2 LEVEL OF CARE FORM     IDENTIFICATION  Patient Name: Stacey [name] Birthdate: 14-Dec-1940 Sex: female Admission Date (Current Location): 08/30/2023  [facility] and [place] Number:      Facility and Address:  [facility],  [address] [place], [place] [address]      Provider Number: [id number]  Attending Physician Name and Address:  Burnadette Pop, MD  Relative Name and Phone Number:  Laia Wiley [facility]) [phone]    Current Level of Care: Hospital Recommended Level of Care: Skilled Nursing Facility Prior Approval Number:    Date Approved/Denied: 11/19/22 PASRR Number: [id number]  Discharge Plan: SNF    Current Diagnoses: Patient Active Problem List   Diagnosis Date Noted   Encephalopathy 08/30/2023   Fluid overload 08/30/2023   Cellulitis 08/30/2023   Anxiety 08/16/2023   Hyponatremia 08/16/2023   Influenza A 08/16/2023   Acute respiratory failure with hypoxia (HCC) 08/15/2023   Protein-calorie malnutrition, severe 06/23/2023   Multiple rib fractures 06/22/2023   Rib fracture 06/21/2023   AKI (acute kidney injury) (HCC) 06/21/2023   Debility 06/21/2023   Intractable nausea and vomiting 11/18/2022   Nausea & vomiting 11/17/2022   Chronic cough 06/12/2020   Constipation    CAP (community acquired pneumonia) 02/09/2020   Acute on chronic respiratory failure with hypoxia (HCC) 07/06/2019   Fall at home, initial encounter 07/06/2019   Orthostatic hypotension 07/06/2019   Rib fractures 07/04/2019   Pneumonia due to COVID-19 virus 06/11/2019   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HCC) 05/07/2019   Acute asthma exacerbation 12/18/2018   Elevated troponin 12/18/2018   Acute bronchitis 08/30/2018   Chronic respiratory failure with hypoxia (HCC) 04/06/2018   Essential hypertension 03/24/2018   Depression 03/24/2018   Asthma exacerbation 03/23/2018   DOE (dyspnea on exertion) 03/21/2018   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 esophagitis 03/21/2018   Chronic  lung disease    Hypoxia    Dyspnea 01/23/2018   ILD (interstitial lung disease) (HCC) 01/23/2018   Claw toe, acquired, 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   Restrictive lung disease 07/18/2013   Multiple pulmonary nodules/RA lung dz  05/29/2013   Cough variant asthma vs uacs  05/29/2013   Rheumatoid arthritis (HCC) 01/13/2012   Osteopenia 01/13/2012   Lichen sclerosus et atrophicus of the vulva 01/13/2012   Asthma 01/13/2012    Orientation RESPIRATION BLADDER Height & Weight     Self, Time, Situation, Place  Normal Continent Weight: 59.4 kg Height:  5' 5.98" (167.6 cm)  BEHAVIORAL SYMPTOMS/MOOD NEUROLOGICAL BOWEL NUTRITION STATUS     (n/a) Continent Diet (Regular)  AMBULATORY STATUS COMMUNICATION OF NEEDS Skin   Independent Verbally Normal                       Personal Care Assistance Level of Assistance  Bathing, Feeding, Dressing Bathing Assistance: Limited assistance Feeding assistance: Limited assistance Dressing Assistance: Limited assistance     Functional Limitations Info  Sight, Hearing, Speech Sight Info: Adequate Hearing Info: Adequate Speech Info: Adequate    SPECIAL CARE FACTORS FREQUENCY  PT (By licensed PT), OT (By licensed OT)     PT Frequency: 5x/wk OT Frequency: 5x/wk            Contractures Contractures Info: Not present    Additional Factors Info  Code Status Code Status Info: DNR-limited Allergies Info: Penicillins, Remicade (Infliximab)           Current Medications (09/02/2023):  This is the current hospital active medication list Current Facility-Administered Medications  Medication  Dose Route Frequency Provider Last Rate Last Admin   acetaminophen (TYLENOL) tablet 650 mg  650 mg Oral Q6H PRN Nolberto Hanlon, MD       Or   acetaminophen (TYLENOL) suppository 650 mg  650 mg Rectal Q6H PRN Nolberto Hanlon, MD       acetylcysteine (MUCOMYST) 20 % nebulizer / oral solution 4 mL  4 mL Nebulization BID Nolberto Hanlon, MD   4 mL at 09/02/23 0852    amitriptyline (ELAVIL) tablet 25 mg  25 mg Oral Sherene Sires, MD   25 mg at 09/01/23 2209   cefTRIAXone (ROCEPHIN) 2 g in sodium chloride 0.9 % 100 mL IVPB  2 g Intravenous Q24H Nolberto Hanlon, MD 200 mL/hr at 09/01/23 1710 2 g at 09/01/23 1710   cholecalciferol (VITAMIN D3) 25 MCG (1000 UNIT) tablet 1,000 Units  1,000 Units Oral QPM Nolberto Hanlon, MD   1,000 Units at 09/01/23 1708   doxycycline (VIBRA-TABS) tablet 100 mg  100 mg Oral BID Danford Bad, RPH   100 mg at 09/02/23 1003   enoxaparin (LOVENOX) injection 40 mg  40 mg Subcutaneous Q24H Nolberto Hanlon, MD   40 mg at 09/02/23 0825   feeding supplement (ENSURE ENLIVE / ENSURE PLUS) liquid 237 mL  1 Bottle Oral BID Nolberto Hanlon, MD   237 mL at 09/02/23 1006   furosemide (LASIX) tablet 40 mg  40 mg Oral Daily Nolberto Hanlon, MD   40 mg at 09/02/23 1005   gabapentin (NEURONTIN) capsule 600 mg  600 mg Oral QHS Nolberto Hanlon, MD   600 mg at 09/01/23 2210   guaiFENesin (MUCINEX) 12 hr tablet 1,200 mg  1,200 mg Oral BID Meredeth Ide, MD   1,200 mg at 09/02/23 1003   ipratropium-albuterol (DUONEB) 0.5-2.5 (3) MG/3ML nebulizer solution 3 mL  3 mL Nebulization BID Nolberto Hanlon, MD   3 mL at 09/02/23 0852   ketorolac (TORADOL) 15 MG/ML injection 15 mg  15 mg Intravenous Q6H PRN Meredeth Ide, MD   15 mg at 09/02/23 1221   lidocaine (LIDODERM) 5 % 1 patch  1 patch Transdermal Q24H Nolberto Hanlon, MD   1 patch at 09/02/23 1005   melatonin tablet 5 mg  5 mg Oral QHS Burnadette Pop, MD   5 mg at 09/01/23 2210   ondansetron (ZOFRAN) tablet 4 mg  4 mg Oral Q8H PRN Nolberto Hanlon, MD       Oral care mouth rinse  15 mL Mouth Rinse PRN Nolberto Hanlon, MD       pantoprazole (PROTONIX) EC tablet 40 mg  40 mg Oral Daily Nolberto Hanlon, MD   40 mg at 09/02/23 1003   polyethylene glycol (MIRALAX / GLYCOLAX) packet 17 g  17 g Oral Daily PRN Nolberto Hanlon, MD       pravastatin (PRAVACHOL) tablet 20 mg  20 mg Oral Daily Nolberto Hanlon, MD   20 mg at 09/02/23 1005   [START ON 09/03/2023]  predniSONE (DELTASONE) tablet 40 mg  40 mg Oral Q breakfast Adhikari, Willia Craze, MD       sodium chloride flush (NS) 0.9 % injection 3 mL  3 mL Intravenous Q12H Nolberto Hanlon, MD   3 mL at 09/02/23 1006   venlafaxine XR (EFFEXOR-XR) 24 hr capsule 75 mg  75 mg Oral Daily Nolberto Hanlon, MD   75 mg at 09/02/23 1005     Discharge Medications: Please see discharge summary for a list of discharge medications.  Relevant Imaging Results:  Relevant Lab Results:   Additional Information SS# [id number]  Beckie Busing, RN

## 2023-09-02 NOTE — Telephone Encounter (Signed)
It is probably best the [facility]-the orders and the infusion is done here through their orders

## 2023-09-02 NOTE — Progress Notes (Signed)
PROGRESS NOTE  LEONTINE RADMAN  BJY:[id number] DOB: Jan 25, 1941 DOA: 08/30/2023 PCP: Eloisa Northern, MD   Brief Narrative: Patient is a 83 year old female with history of ILD, GERD, rheumatoid arthritis, hypertension who presented here with complaint of persistent nausea, vomiting.  She was recently discharged from here to a nursing facility on 08/17/2023 after she was managed for acute hypoxic respiratory failure secondary to influenza A, AKI.  Patient found to be hypoxic on arrival, needing oxygen.  Initial chest x-ray showed features of ILD, no acute findings.  CT chest showed features of bronchitis with bronchial thickening.  Started on Mucomyst, bronchodilators.  Also started on antibiotics for possible community-acquired pneumonia, steroids today.  Now has been weaned to room air.  PT recommending SNF on discharge.  Possible discharge in a.m.  Assessment & Plan:  Principal Problem:   Hypoxia Active Problems:   Nausea & vomiting   Restrictive lung disease   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 esophagitis   Encephalopathy   Fluid overload   Cellulitis  Acute hypoxic respiratory failure/bronchitis/suspected CAP: Not on oxygen at baseline.  Found to be hypoxic on presentation.Chest imagings as above.  Continue Mucomyst, Mucinex, antibiotics.Had leukocytosis, elevated procalcitonin.  Cultures have not shown any growth.  Leukocytosis could have been contributed from steroids. Continue current antibiotics, steroids.  Currently she has been weaned to room air.  History of ILD: Follows with Dr. Marchelle Gearing for ILD, rheumatoid arthritis.  Takes nintedanib, it has been known to cause GI intolerance.  Currently this medication is on hold after discussion with Dr. Monica Becton. Has bilateral basal crackles.  Started on steroids.  Nausea/vomiting: Resolved  Hypokalemia: supplemented potassium and corrected  Right lower extremity cellulitis: Continue current antibiotics.  Was taking Keflex as an outpatient.  Wound care  following.  Lower extremity edema: Echo showed EF of 65 to 70%, grade 2 diastolic dysfunction.  Continue Lasix at current dose           DVT prophylaxis:enoxaparin (LOVENOX) injection 40 mg Start: 08/31/23 0800 SCDs Start: 08/30/23 1712     Code Status: Do not attempt resuscitation (DNR) PRE-ARREST INTERVENTIONS DESIRED  Family Communication: Daughter at bedside  Patient status:Inpatient  Patient is from :SNF  Anticipated discharge to:SNF  Estimated DC date:tomorrow   Consultants: None  Procedures:None  Antimicrobials:  Anti-infectives (From admission, onward)    Start     Dose/Rate Route Frequency Ordered Stop   09/01/23 2000  doxycycline (VIBRA-TABS) tablet 100 mg        100 mg Oral 2 times daily 09/01/23 0833 09/04/23 0759   08/30/23 1800  doxycycline (VIBRAMYCIN) 100 mg in sodium chloride 0.9 % 250 mL IVPB  Status:  Discontinued        100 mg 125 mL/hr over 120 Minutes Intravenous Every 12 hours 08/30/23 1730 09/01/23 0833   08/30/23 1800  cefTRIAXone (ROCEPHIN) 2 g in sodium chloride 0.9 % 100 mL IVPB        2 g 200 mL/hr over 30 Minutes Intravenous Every 24 hours 08/30/23 1747     08/30/23 1800  metroNIDAZOLE (FLAGYL) IVPB 500 mg  Status:  Discontinued        500 mg 100 mL/hr over 60 Minutes Intravenous Every 12 hours 08/30/23 1747 09/01/23 1151   08/30/23 1145  vancomycin (VANCOREADY) IVPB 1250 mg/250 mL        1,250 mg 166.7 mL/hr over 90 Minutes Intravenous  Once 08/30/23 1144 08/30/23 1621   08/30/23 1130  ceFEPIme (MAXIPIME) 2 g in sodium chloride 0.9 % 100 mL  IVPB        2 g 200 mL/hr over 30 Minutes Intravenous  Once 08/30/23 1127 08/30/23 1621   08/30/23 1130  metroNIDAZOLE (FLAGYL) IVPB 500 mg        500 mg 100 mL/hr over 60 Minutes Intravenous  Once 08/30/23 1127 08/30/23 1621   08/30/23 1130  vancomycin (VANCOCIN) IVPB 1000 mg/200 mL premix  Status:  Discontinued        1,000 mg 200 mL/hr over 60 Minutes Intravenous  Once 08/30/23 1127  08/30/23 1144       Subjective:  Patient seen and examined at bedside today.  She looks much better today.  She has been weaned to room air.  She worked with the physical therapist.  Daughter at bedside, patient denies any worsening shortness of breath or cough.  Objective: Vitals:   09/01/23 2015 09/02/23 0520 09/02/23 0853 09/02/23 1004  BP: 139/76 (!) 145/74  (!) 140/52  Pulse: 83 80  80  Resp: 18 18    Temp: 97.8 F (36.6 C) 98 F (36.7 C)    TempSrc: Oral Oral    SpO2: 98% 98% 100% 95%  Weight:      Height:        Intake/Output Summary (Last 24 hours) at 09/02/2023 1138 Last data filed at 09/01/2023 1254 Gross per 24 hour  Intake 0 ml  Output 450 ml  Net -450 ml   Filed Weights   08/31/23 0227 08/31/23 0500 09/01/23 0717  Weight: 57.2 kg 57.2 kg 59.4 kg    Examination:  General exam: Overall comfortable, not in distress, chronically ill looking/deconditioned HEENT: PERRL Respiratory system:  no wheezes or crackles today Cardiovascular system: S1 & S2 heard, RRR.  Gastrointestinal system: Abdomen is nondistended, soft and nontender. Central nervous system: Alert and oriented Extremities: Erythematous discoloration of right lower extremity, shallow ulcer on the left leg, no clubbing ,no cyanosis Skin: No rashes, no ulcers,no icterus      Data Reviewed: I have personally reviewed following labs and imaging studies  CBC: Recent Labs  Lab 08/26/23 1442 08/30/23 1122 08/30/23 1632 08/30/23 1825 08/31/23 0555 09/01/23 0604 09/02/23 0549  WBC 8.6 16.5*  --  14.9* 9.0 16.5* 14.3*  NEUTROABS 4.3 10.6*  --   --   --   --   --   HGB 11.2* 11.2* 10.2* 9.8* 9.2* 8.9* 9.0*  HCT 33.7* 35.1* 30.0* 31.3* 29.2* 28.2* 28.4*  MCV 88.6 90.5  --  91.3 91.3 91.9 91.3  PLT 454.0* 481*  --  390 376 401* 409*   Basic Metabolic Panel: Recent Labs  Lab 08/26/23 1442 08/30/23 1122 08/30/23 1632 08/30/23 1825 08/31/23 0555 09/01/23 0604 09/02/23 0549  NA 137 131* 132*   --  134* 137 138  K 3.8 3.9 4.1  --  3.7 3.3* 3.7  CL 100 94* 97*  --  101 104 101  CO2 30 25  --   --  27 28 28   GLUCOSE 80 96 116*  --  157* 93 126*  BUN 9 13 11   --  16 18 15   CREATININE 0.66 0.77 0.80 0.78 0.68 0.84 0.54  CALCIUM 8.4 8.1*  --   --  7.6* 7.5* 7.7*     Recent Results (from the past 240 hours)  Blood Culture (routine x 2)     Status: None (Preliminary result)   Collection Time: 08/30/23 11:30 AM   Specimen: BLOOD  Result Value Ref Range Status   Specimen Description  Final    BLOOD LEFT ANTECUBITAL Performed at [facility], [address], [place], [place] [address]    Special Requests   Final    BOTTLES DRAWN AEROBIC AND ANAEROBIC Blood Culture adequate volume Performed at [facility], [address] [place], [place] [address]    Culture   Final    NO GROWTH 3 DAYS Performed at [facility], [address] [place], [place] [address]    Report Status PENDING  Incomplete  Blood Culture (routine x 2)     Status: None (Preliminary result)   Collection Time: 08/30/23 11:34 AM   Specimen: BLOOD RIGHT ARM  Result Value Ref Range Status   Specimen Description   Final    BLOOD RIGHT ARM Performed at [facility], [address], [place], [place] [address]    Special Requests   Final    BOTTLES DRAWN AEROBIC AND ANAEROBIC Blood Culture adequate volume Performed at [facility], [address] [place], [place] [address]    Culture   Final    NO GROWTH 3 DAYS Performed at [facility], [address] [place], [place] [address]    Report Status PENDING  Incomplete  Resp panel by RT-PCR (RSV, Flu A&B, Covid) Anterior Nasal Swab     Status: None   Collection Time: 08/30/23  4:17 PM   Specimen: Anterior Nasal Swab  Result Value Ref Range Status   SARS Coronavirus 2 by RT PCR NEGATIVE NEGATIVE Final    Comment: (NOTE) SARS-CoV-2 target nucleic acids are NOT DETECTED.  The  SARS-CoV-2 RNA is generally detectable in upper respiratory specimens during the acute phase of infection. The lowest concentration of SARS-CoV-2 viral copies this assay can detect is 138 copies/mL. A negative result does not preclude SARS-Cov-2 infection and should not be used as the sole basis for treatment or other patient management decisions. A negative result may occur with  improper specimen collection/handling, submission of specimen other than nasopharyngeal swab, presence of viral mutation(s) within the areas targeted by this assay, and inadequate number of viral copies(<138 copies/mL). A negative result must be combined with clinical observations, patient history, and epidemiological information. The expected result is Negative.  Fact Sheet for Patients:  BloggerCourse.com  Fact Sheet for Healthcare Providers:  SeriousBroker.it  This test is no t yet approved or cleared by the [place] FDA and  has been authorized for detection and/or diagnosis of SARS-CoV-2 by FDA under an Emergency Use Authorization (EUA). This EUA will remain  in effect (meaning this test can be used) for the duration of the COVID-19 declaration under Section 564(b)(1) of the Act, 21 U.S.C.section 360bbb-3(b)(1), unless the authorization is terminated  or revoked sooner.       Influenza A by PCR NEGATIVE NEGATIVE Final   Influenza B by PCR NEGATIVE NEGATIVE Final    Comment: (NOTE) The Xpert Xpress SARS-CoV-2/FLU/RSV plus assay is intended as an aid in the diagnosis of influenza from Nasopharyngeal swab specimens and should not be used as a sole basis for treatment. Nasal washings and aspirates are unacceptable for Xpert Xpress SARS-CoV-2/FLU/RSV testing.  Fact Sheet for Patients: BloggerCourse.com  Fact Sheet for Healthcare Providers: SeriousBroker.it  This test is not yet approved or  cleared by the [place] FDA and has been authorized for detection and/or diagnosis of SARS-CoV-2 by FDA under an Emergency Use Authorization (EUA). This EUA will remain in effect (meaning this test can be used)  for the duration of the COVID-19 declaration under Section 564(b)(1) of the Act, 21 U.S.C. section 360bbb-3(b)(1), unless the authorization is terminated or revoked.     Resp Syncytial Virus by PCR NEGATIVE NEGATIVE Final    Comment: (NOTE) Fact Sheet for Patients: BloggerCourse.com  Fact Sheet for Healthcare Providers: SeriousBroker.it  This test is not yet approved or cleared by the [place] FDA and has been authorized for detection and/or diagnosis of SARS-CoV-2 by FDA under an Emergency Use Authorization (EUA). This EUA will remain in effect (meaning this test can be used) for the duration of the COVID-19 declaration under Section 564(b)(1) of the Act, 21 U.S.C. section 360bbb-3(b)(1), unless the authorization is terminated or revoked.  Performed at [facility], [address] [place] [address] [place], [place] [address]      Radiology Studies: ECHOCARDIOGRAM COMPLETE Result Date: 08/31/2023    ECHOCARDIOGRAM REPORT   Patient Name:   Stacey [name] Date of Exam: 08/31/2023 Medical Rec #:  [id number]         Height:       66.0 in Accession #:    [id number]        Weight:       126.1 lb Date of Birth:  07-25-1940         BSA:          1.644 m Patient Age:    82 years          BP:           126/62 mmHg Patient Gender: F                 HR:           85 bpm. Exam Location:  Inpatient Procedure: 2D Echo, Cardiac Doppler and Color Doppler (Both Spectral and Color            Flow Doppler were utilized during procedure). Indications:    CHF-Acute Systolic I50.21  History:        Patient has prior history of Echocardiogram examinations, most                 recent 12/18/2018. Signs/Symptoms:Dyspnea.  Sonographer:     Webb Laws Referring Phys: [id number] HERSH GOEL IMPRESSIONS  1. Left ventricular ejection fraction, by estimation, is 65 to 70%. The left ventricle has normal function. The left ventricle has no regional wall motion abnormalities. Left ventricular diastolic parameters are consistent with Grade II diastolic dysfunction (pseudonormalization).  2. Right ventricular systolic function is normal. The right ventricular size is normal.  3. Left atrial size was mildly dilated.  4. The mitral valve is normal in structure. Trivial mitral valve regurgitation. No evidence of mitral stenosis.  5. Isolated calcification of the NCC Leaflet. The aortic valve is tricuspid. There is mild calcification of the aortic valve. Aortic valve regurgitation is not visualized. Aortic valve sclerosis is present, with no evidence of aortic valve stenosis.  6. Aortic dilatation noted. There is mild dilatation of the ascending aorta, measuring 40 mm.  7. The inferior vena cava is dilated in size with >50% respiratory variability, suggesting right atrial pressure of 8 mmHg. Comparison(s): No significant change from prior study. FINDINGS  Left Ventricle: Left ventricular ejection fraction, by estimation, is 65 to 70%. The left ventricle has normal function. The left ventricle has no regional wall motion abnormalities. Strain imaging was not performed. The left ventricular internal cavity  size was normal in size. There is no left ventricular hypertrophy. Left ventricular  diastolic parameters are consistent with Grade II diastolic dysfunction (pseudonormalization). Right Ventricle: The right ventricular size is normal. No increase in right ventricular wall thickness. Right ventricular systolic function is normal. Left Atrium: Left atrial size was mildly dilated. Right Atrium: Right atrial size was normal in size. Prominent Eustachian valve. Pericardium: There is no evidence of pericardial effusion. Mitral Valve: The mitral valve is normal in  structure. Trivial mitral valve regurgitation. No evidence of mitral valve stenosis. Tricuspid Valve: The tricuspid valve is normal in structure. Tricuspid valve regurgitation is mild . No evidence of tricuspid stenosis. Aortic Valve: Isolated calcification of the NCC Leaflet. The aortic valve is tricuspid. There is mild calcification of the aortic valve. Aortic valve regurgitation is not visualized. Aortic valve sclerosis is present, with no evidence of aortic valve stenosis. Pulmonic Valve: The pulmonic valve was normal in structure. Pulmonic valve regurgitation is not visualized. No evidence of pulmonic stenosis. Aorta: Aortic dilatation noted. There is mild dilatation of the ascending aorta, measuring 40 mm. Venous: The inferior vena cava is dilated in size with greater than 50% respiratory variability, suggesting right atrial pressure of 8 mmHg. IAS/Shunts: The atrial septum is grossly normal. Additional Comments: 3D imaging was not performed.  LEFT VENTRICLE PLAX 2D LVIDd:         4.50 cm   Diastology LVIDs:         2.80 cm   LV e' medial:    5.33 cm/s LV PW:         1.00 cm   LV E/e' medial:  18.9 LV IVS:        0.90 cm   LV e' lateral:   8.59 cm/s LVOT diam:     1.90 cm   LV E/e' lateral: 11.8 LV SV:         70 LV SV Index:   43 LVOT Area:     2.84 cm  RIGHT VENTRICLE             IVC RV Basal diam:  3.20 cm     IVC diam: 2.20 cm RV S prime:     14.70 cm/s TAPSE (M-mode): 2.0 cm LEFT ATRIUM             Index        RIGHT ATRIUM           Index LA diam:        4.00 cm 2.43 cm/m   RA Area:     12.10 cm LA Vol (A2C):   59.5 ml 36.20 ml/m  RA Volume:   23.90 ml  14.54 ml/m LA Vol (A4C):   46.4 ml 28.23 ml/m LA Biplane Vol: 55.6 ml 33.82 ml/m  AORTIC VALVE LVOT Vmax:   113.00 cm/s LVOT Vmean:  87.000 cm/s LVOT VTI:    0.247 m  AORTA Ao Root diam: 3.00 cm Ao Asc diam:  3.90 cm MITRAL VALVE                TRICUSPID VALVE MV Area (PHT): 4.06 cm     TR Peak grad:   35.8 mmHg MV Decel Time: 187 msec     TR  Vmax:        299.00 cm/s MV E velocity: 101.00 cm/s MV A velocity: 100.00 cm/s  SHUNTS MV E/A ratio:  1.01         Systemic VTI:  0.25 m  Systemic Diam: 1.90 cm Riley Lam MD Electronically signed by Riley Lam MD Signature Date/Time: 08/31/2023/5:28:23 PM    Final     Scheduled Meds:  acetylcysteine  4 mL Nebulization BID   amitriptyline  25 mg Oral QHS   cholecalciferol  1,000 Units Oral QPM   doxycycline  100 mg Oral BID   enoxaparin (LOVENOX) injection  40 mg Subcutaneous Q24H   feeding supplement  1 Bottle Oral BID   furosemide  40 mg Oral Daily   gabapentin  600 mg Oral QHS   guaiFENesin  1,200 mg Oral BID   ipratropium-albuterol  3 mL Nebulization BID   lidocaine  1 patch Transdermal Q24H   melatonin  5 mg Oral QHS   methylPREDNISolone (SOLU-MEDROL) injection  40 mg Intravenous Q12H   pantoprazole  40 mg Oral Daily   pravastatin  20 mg Oral Daily   sodium chloride flush  3 mL Intravenous Q12H   venlafaxine XR  75 mg Oral Daily   Continuous Infusions:  cefTRIAXone (ROCEPHIN)  IV 2 g (09/01/23 1710)     LOS: 3 days   Burnadette Pop, MD [facility] P2/20/2025, 11:38 AM

## 2023-09-02 NOTE — Evaluation (Addendum)
Physical Therapy Evaluation Patient Details Name: Stacey [name] MRN: [id number] DOB: 10-Jun-1941 Today's Date: 09/02/2023  History of Present Illness  Pt is a 83 year old female presenting with complaint of persistent nausea, vomiting found to be hypoxic. She was recently discharged from here to a nursing facility on 08/17/2023 after she was managed for acute hypoxic respiratory failure secondary to influenza A, AKI. PMH significant for ILD, GERD, rheumatoid arthritis, and hypertension.   Clinical Impression  Pt is an 83 y.o. female with above HPI resulting in the deficits listed below (see PT Problem List). Pt recent d/c from hospital to SNF.Prior to that living in ILF for ~15month. Pt requires MOD A for sit to stand transfers and MIN A for short distance ambulation. Pt limited by fatigue demonstrating generalized weakness and significantly decreased activity tolerance. Pt on 2L with O2 sats at 99% upon entry, 1L 95-97%. Trialed on RA and pt low 90% with x1 period of O2 reading in mid 80% with poor pleth waveform- recovery to 90-92% following.Pt is currently below baselin mobility level and will will benefit from continued skilled PT to maximize functional mobility and increase independence. Ultimate goals for pt to be able to get back to independence around her home and ambulate distances to dining hall at ILF. Recommend continued inpatient follow up therapy, <3 hours/day upon d/c.     Vitals during session: Supine: 129/68 (82), 87bpm  Sit: 129/75 (90), 93 bpm 97% on RA After mobility: 135/66 (87), 92% on RA     If plan is discharge home, recommend the following: A little help with walking and/or transfers;A little help with bathing/dressing/bathroom;Assist for transportation;Help with stairs or ramp for entrance;Assistance with cooking/housework;Direct supervision/assist for medications management;Direct supervision/assist for financial management   Can travel by private vehicle   Yes     Equipment Recommendations Other (comment) (defer to next level of care)  Recommendations for Other Services       Functional Status Assessment Patient has had a recent decline in their functional status and demonstrates the ability to make significant improvements in function in a reasonable and predictable amount of time.     Precautions / Restrictions Precautions Precautions: Fall Restrictions Weight Bearing Restrictions Per Provider Order: No      Mobility  Bed Mobility   Bed Mobility: Supine to Sit     Supine to sit: Contact guard     General bed mobility comments: HOB flat, no bed rails to simulate home environment. increased time.    Transfers Overall transfer level: Needs assistance Equipment used: Rolling walker (2 wheels) Transfers: Sit to/from Stand Sit to Stand: Mod assist           General transfer comment: Pt required increased time and Mod Assist to rise with VC's on proper hand placement. Use of grab bars for power up during toilet transfers. Increased fatigue during toileting with wiping- ultimately required assist from therapist for remainder of pericare. STS x3.    Ambulation/Gait Ambulation/Gait assistance: Mod assist Gait Distance (Feet): 16 Feet Assistive device: Rolling walker (2 wheels) Gait Pattern/deviations: Step-through pattern, Decreased stride length, Trunk flexed, Narrow base of support Gait velocity: decreased     General Gait Details: to/from toilet, slow speed. Increased fatigue, mild unsteadiness- generalized weakness.  Stairs            Wheelchair Mobility     Tilt Bed    Modified Rankin (Stroke Patients Only)       Balance Overall balance assessment: Needs assistance Sitting-balance support: Feet  supported, Single extremity supported, No upper extremity supported Sitting balance-Leahy Scale: Good     Standing balance support: Bilateral upper extremity supported, During functional activity, No upper extremity  supported Standing balance-Leahy Scale: Fair Standing balance comment: able to perform static standing without UE support                             Pertinent Vitals/Pain Pain Assessment Pain Assessment: No/denies pain    Home Living Family/patient expects to be discharged to:: Skilled nursing facility Living Arrangements: Alone Available Help at Discharge: Family;Available PRN/intermittently Type of Home: Independent living facility Home Access: Level entry         Home Equipment: Rollator (4 wheels);Shower seat (hurrycane) Additional Comments: admit from [facility] SNF for rehab. Daughter returned to room and assisted with PLOF/home environment. Lives at [facility] ILF for about 1 month.    Prior Function Prior Level of Function : Independent/Modified Independent             Mobility Comments: history of falls. rollator in community. ADLs Comments: Daughter reports issues with balance/dizziness- limits driving. WIS with seat in ILF, grab bars.     Extremity/Trunk Assessment   Upper Extremity Assessment Upper Extremity Assessment: Generalized weakness;Right hand dominant    Lower Extremity Assessment Lower Extremity Assessment: Generalized weakness    Cervical / Trunk Assessment Cervical / Trunk Assessment: Kyphotic  Communication   Communication Communication: No apparent difficulties (Seems to get overwhelmed a little and cognition questionable at times, but overall no gross deficits noted.) Factors Affecting Communication: Hearing impaired    Cognition Arousal: Alert Behavior During Therapy: WFL for tasks assessed/performed   PT - Cognitive impairments: Memory                       PT - Cognition Comments: having some dificulty remembering sequence of events with last hospitilization and rehab and what functional mobility level she was at/doing on her own at rehab. Was able to tell PT that she got to point where she was using rollator  again. Following commands: Intact       Cueing       General Comments      Exercises     Assessment/Plan    PT Assessment Patient needs continued PT services  PT Problem List Decreased strength;Decreased mobility;Decreased activity tolerance;Decreased balance       PT Treatment Interventions DME instruction;Therapeutic activities;Gait training;Therapeutic exercise;Patient/family education;Functional mobility training;Balance training    PT Goals (Current goals can be found in the Care Plan section)  Acute Rehab PT Goals Patient Stated Goal: to return to ILF and get strength back PT Goal Formulation: With patient/family Time For Goal Achievement: 09/16/23 Potential to Achieve Goals: Good    Frequency Min 1X/week     Co-evaluation               AM-PAC PT "6 Clicks" Mobility  Outcome Measure Help needed turning from your back to your side while in a flat bed without using bedrails?: A Little Help needed moving from lying on your back to sitting on the side of a flat bed without using bedrails?: A Little Help needed moving to and from a bed to a chair (including a wheelchair)?: A Lot Help needed standing up from a chair using your arms (e.g., wheelchair or bedside chair)?: A Lot Help needed to walk in hospital room?: A Little Help needed climbing 3-5 steps with a railing? :  A Lot 6 Click Score: 15    End of Session Equipment Utilized During Treatment: Gait belt;Oxygen Activity Tolerance: Patient limited by fatigue Patient left: in chair;with call bell/phone within reach;with chair alarm set;with family/visitor present Nurse Communication: Mobility status PT Visit Diagnosis: Unsteadiness on feet (R26.81);Muscle weakness (generalized) (M62.81);Difficulty in walking, not elsewhere classified (R26.2)    Time: 0910-1000 PT Time Calculation (min) (ACUTE ONLY): 50 min   Charges:   PT Evaluation $PT Eval Low Complexity: 1 Low PT Treatments $Therapeutic Activity:  23-37 mins PT General Charges $$ ACUTE PT VISIT: 1 Visit         Lyman Speller PT, DPT  Acute Rehabilitation Services  Office [phone]  09/02/2023, 12:42 PM

## 2023-09-02 NOTE — TOC Progression Note (Addendum)
Transition of Care [facility]) - Progression Note    Patient Details  Name: Stacey [name] MRN: [id number] Date of Birth: [birth year]  Transition of Care [facility]) CM/SW Contact  Beckie Busing, RN Phone [phone]  09/02/2023, 11:20 AM  Clinical Narrative:    Patient is from [place]. CM has sent message to [facility] admissions coordinator to confirm that patient will be able to return. Awaiting response.   [name] has confirmed that patient is from [place] and can return tomorrow. FL2 has been updated. Patient does not need insurance auth.         Expected Discharge Plan and Services                                               Social Determinants of Health (SDOH) Interventions SDOH Screenings   Food Insecurity: No Food Insecurity (08/30/2023)  Housing: Low Risk  (08/30/2023)  Transportation Needs: No Transportation Needs (08/30/2023)  Utilities: Not At Risk (08/30/2023)  Depression (PHQ2-9): Low Risk  (10/24/2020)  Social Connections: Moderately Integrated (08/30/2023)  Tobacco Use: Low Risk  (08/30/2023)    Readmission Risk Interventions    08/16/2023    2:36 PM  Readmission Risk Prevention Plan  Transportation Screening Complete  PCP or Specialist Appt within 5-7 Days Complete  Home Care Screening Complete  Medication Review (RN CM) Complete

## 2023-09-02 NOTE — Evaluation (Signed)
Occupational Therapy Evaluation Patient Details Name: Stacey [name] MRN: [id number] DOB: [birth year] Today's Date: 09/02/2023   History of Present Illness   Pt is a 83 year old female presenting with complaint of persistent nausea, vomiting found to be hypoxic. She was recently discharged from here to a nursing facility on 08/17/2023 after she was managed for acute hypoxic respiratory failure secondary to influenza A, AKI. PMH significant for ILD, GERD, rheumatoid arthritis, and hypertension.     Clinical Impressions Patient is currently requiring assistance with ADLs including up to moderate assist with Lower body ADLs in seated and standing positions, and setup assist with Upper body ADLs in EOB position, as well as  up to minimal assist with functional transfers to toilet with use of RW.   Current level of function is below patient's typical baseline.    During this evaluation, patient was limited by mild cognitive deficits, generalized weakness, impaired activity tolerance, all of which has the potential to impact patient's and/or caregivers' safety and independence during functional mobility, as well as performance for ADLs.    Patient lives at an ILF at [place] with a daughter who is involved. For PRN supervision and assistance.  Patient demonstrates good rehab potential, and should benefit from continued skilled occupational therapy services while in acute care to maximize safety, independence and quality of life at home.  Continued occupational therapy services are recommended.  ?      If plan is discharge home, recommend the following:   A little help with walking and/or transfers;A lot of help with bathing/dressing/bathroom;Supervision due to cognitive status;Assistance with cooking/housework;Assist for transportation     Functional Status Assessment   Patient has had a recent decline in their functional status and demonstrates the ability to make significant  improvements in function in a reasonable and predictable amount of time.     Equipment Recommendations   Other (comment) (long handled adaptiev equipment)     Recommendations for Other Services         Precautions/Restrictions   Precautions Precautions: Fall Precaution/Restrictions Comments: monitor vitals Restrictions Weight Bearing Restrictions Per Provider Order: No     Mobility Bed Mobility Overal bed mobility: Needs Assistance Bed Mobility: Sit to Supine       Sit to supine: Supervision, Used rails        Transfers                          Balance Overall balance assessment: Needs assistance Sitting-balance support: Feet supported, Single extremity supported, No upper extremity supported Sitting balance-Leahy Scale: Good     Standing balance support: Bilateral upper extremity supported, During functional activity, No upper extremity supported Standing balance-Leahy Scale: Fair Standing balance comment: able to perform static standing with single UE support, once pt had attained her stance.                           ADL either performed or assessed with clinical judgement   ADL Overall ADL's : Needs assistance/impaired Eating/Feeding: Set up;Bed level   Grooming: Wash/dry hands;Sitting;Set up Grooming Details (indicate cue type and reason): Provided with washcloth after toileting. Too fatigued to stand at sink after toileting and LE dressing. Upper Body Bathing: Contact guard assist;Sitting   Lower Body Bathing: Minimal assistance;Moderate assistance;Sit to/from stand;Sitting/lateral leans   Upper Body Dressing : Minimal assistance;Sitting   Lower Body Dressing: Moderate assistance;Sit to/from stand;Sitting/lateral leans   Toilet Transfer: Comfort height  toilet;Ambulation;Rolling walker (2 wheels) Toilet Transfer Details Pt stood once from recliner with Moderate assist and posterior bias and reported she had urgent bathroom  needs.   (indicate cue type and reason): Limited ambulation tolerance, so recliner brought to bathroom door and pt taking ~3-4 steps with RW and Minimal assist to and from toilet. Heavy use of grab bar and able to pull up and lower to commode with CGA.. Toileting- Clothing Manipulation and Hygiene: Moderate assistance;Sitting/lateral lean;Sit to/from stand;Cueing for compensatory techniques       Functional mobility during ADLs: Contact guard assist;Cueing for safety;Cueing for sequencing;Rolling walker (2 wheels)       Vision   Vision Assessment?: No apparent visual deficits     Perception         Praxis         Pertinent Vitals/Pain Pain Assessment Pain Assessment: 0-10 Pain Score: 5  Pain Location: Bottom feels sore in recliner x 2 hours. Pt assisted back to bed for pressure relief. Pt declined sidelying as she wanted to eat meal daughter brought. Dtr stated could assist pt onto side after meal. Pain Descriptors / Indicators: Sore Pain Intervention(s): Limited activity within patient's tolerance, Monitored during session, Repositioned     Extremity/Trunk Assessment Upper Extremity Assessment Upper Extremity Assessment: Generalized weakness;Right hand dominant   Lower Extremity Assessment Lower Extremity Assessment: Generalized weakness   Cervical / Trunk Assessment Cervical / Trunk Assessment: Kyphotic   Communication Communication Communication: No apparent difficulties (Seems to get overwhelmed a little and cognition questionable at times, but overall no gross deficits noted.) Factors Affecting Communication: Hearing impaired   Cognition Arousal: Alert Behavior During Therapy: WFL for tasks assessed/performed Cognition: No apparent impairments             OT - Cognition Comments: Ox4                 Following commands: Intact       Cueing  General Comments   Cueing Techniques: Verbal cues      Exercises     Shoulder Instructions       Home Living Family/patient expects to be discharged to:: Skilled nursing facility Living Arrangements: Alone Available Help at Discharge: Family;Available PRN/intermittently Type of Home: Independent living facility Home Access: Level entry           Bathroom Shower/Tub: Walk-in shower   Bathroom Toilet: Handicapped height     Home Equipment: Rollator (4 wheels);Shower seat (hurrycane)   Additional Comments: admit from [facility] SNF for rehab. Daughter returned to room and assisted with PLOF/home environment. Lives at [facility] ILF for about 1 month.      Prior Functioning/Environment Prior Level of Function : Independent/Modified Independent             Mobility Comments: history of falls. rollator in community. ADLs Comments: Daughter reports issues with balance/dizziness- limits driving. WIS with seat in ILF, grab bars.    OT Problem List: Decreased strength;Decreased activity tolerance;Decreased knowledge of use of DME or AE;Pain;Impaired balance (sitting and/or standing);Cardiopulmonary status limiting activity   OT Treatment/Interventions: Self-care/ADL training;Therapeutic activities;Cognitive remediation/compensation;Patient/family education;Energy conservation;Therapeutic exercise;DME and/or AE instruction;Balance training      OT Goals(Current goals can be found in the care plan section)   Acute Rehab OT Goals Patient Stated Goal: Return t o [facility] SNF. Pt and daughter both verbalized wish that she would receive more PT and OT there though. OT Goal Formulation: With patient/family Time For Goal Achievement: 09/16/23 Potential to Achieve Goals: Good ADL Goals  Pt Will Perform Lower Body Bathing: with adaptive equipment;sitting/lateral leans;with supervision Pt Will Perform Lower Body Dressing: with adaptive equipment;with modified independence;sitting/lateral leans;sit to/from stand Pt Will Transfer to Toilet: with modified  independence;ambulating Pt Will Perform Toileting - Clothing Manipulation and hygiene: with modified independence;sitting/lateral leans;sit to/from stand Pt/caregiver will Perform Home Exercise Program: Increased strength;Both right and left upper extremity;With Supervision Additional ADL Goal #1: Pt will perform any 2 ADLs fully with VSS and RPE no greater than 5/10 to demostrate improved activity tolerance needed for return to ILF. Additional ADL Goal #2: Patient will identify at least 3 energy conservation strategies to employ at home in order to maximize function and quality of life and decrease caregiver burden while preventing exacerbation of symptoms and rehospitalization.   OT Frequency:  Min 1X/week    Co-evaluation              AM-PAC OT "6 Clicks" Daily Activity     Outcome Measure Help from another person eating meals?: None Help from another person taking care of personal grooming?: A Little Help from another person toileting, which includes using toliet, bedpan, or urinal?: A Lot Help from another person bathing (including washing, rinsing, drying)?: A Lot Help from another person to put on and taking off regular upper body clothing?: A Little Help from another person to put on and taking off regular lower body clothing?: A Lot 6 Click Score: 16   End of Session Equipment Utilized During Treatment: Gait belt;Rolling walker (2 wheels) Nurse Communication: Mobility status  Activity Tolerance: Patient limited by fatigue Patient left: in bed;with call bell/phone within reach;with bed alarm set;with family/visitor present  OT Visit Diagnosis: Unsteadiness on feet (R26.81);Muscle weakness (generalized) (M62.81);Pain (decreased adls) Pain - part of body:  (buttocks)                Time: 0981-1914 OT Time Calculation (min): 35 min Charges:  OT General Charges $OT Visit: 1 Visit OT Evaluation $OT Eval Low Complexity: 1 Low OT Treatments $Self Care/Home Management : 8-22  mins  Victorino Dike, OT Acute Rehab Services Office: [phone] 09/02/2023   Theodoro Clock 09/02/2023, 12:51 PM

## 2023-09-02 NOTE — Telephone Encounter (Signed)
Noted -will await patient to be discharged before consulting with her

## 2023-09-03 ENCOUNTER — Telehealth: Payer: Self-pay | Admitting: Internal Medicine

## 2023-09-03 DIAGNOSIS — J9601 Acute respiratory failure with hypoxia: Secondary | ICD-10-CM | POA: Diagnosis not present

## 2023-09-03 DIAGNOSIS — R2681 Unsteadiness on feet: Secondary | ICD-10-CM | POA: Diagnosis not present

## 2023-09-03 DIAGNOSIS — I739 Peripheral vascular disease, unspecified: Secondary | ICD-10-CM | POA: Diagnosis not present

## 2023-09-03 DIAGNOSIS — R0902 Hypoxemia: Secondary | ICD-10-CM | POA: Diagnosis not present

## 2023-09-03 DIAGNOSIS — M6281 Muscle weakness (generalized): Secondary | ICD-10-CM | POA: Diagnosis not present

## 2023-09-03 DIAGNOSIS — S2241XD Multiple fractures of ribs, right side, subsequent encounter for fracture with routine healing: Secondary | ICD-10-CM | POA: Diagnosis not present

## 2023-09-03 DIAGNOSIS — M069 Rheumatoid arthritis, unspecified: Secondary | ICD-10-CM | POA: Diagnosis not present

## 2023-09-03 DIAGNOSIS — F329 Major depressive disorder, single episode, unspecified: Secondary | ICD-10-CM | POA: Diagnosis not present

## 2023-09-03 DIAGNOSIS — N179 Acute kidney failure, unspecified: Secondary | ICD-10-CM | POA: Diagnosis not present

## 2023-09-03 DIAGNOSIS — J849 Interstitial pulmonary disease, unspecified: Secondary | ICD-10-CM | POA: Diagnosis not present

## 2023-09-03 DIAGNOSIS — I10 Essential (primary) hypertension: Secondary | ICD-10-CM | POA: Diagnosis not present

## 2023-09-03 DIAGNOSIS — E43 Unspecified severe protein-calorie malnutrition: Secondary | ICD-10-CM | POA: Diagnosis not present

## 2023-09-03 DIAGNOSIS — Z09 Encounter for follow-up examination after completed treatment for conditions other than malignant neoplasm: Secondary | ICD-10-CM | POA: Diagnosis not present

## 2023-09-03 DIAGNOSIS — E785 Hyperlipidemia, unspecified: Secondary | ICD-10-CM | POA: Diagnosis not present

## 2023-09-03 DIAGNOSIS — K219 Gastro-esophageal reflux disease without esophagitis: Secondary | ICD-10-CM | POA: Diagnosis not present

## 2023-09-03 DIAGNOSIS — J984 Other disorders of lung: Secondary | ICD-10-CM | POA: Diagnosis not present

## 2023-09-03 DIAGNOSIS — R41841 Cognitive communication deficit: Secondary | ICD-10-CM | POA: Diagnosis not present

## 2023-09-03 DIAGNOSIS — R296 Repeated falls: Secondary | ICD-10-CM | POA: Diagnosis not present

## 2023-09-03 LAB — CBC
HCT: 30.4 % — ABNORMAL LOW (ref 36.0–46.0)
Hemoglobin: 9.2 g/dL — ABNORMAL LOW (ref 12.0–15.0)
MCH: 28.5 pg (ref 26.0–34.0)
MCHC: 30.3 g/dL (ref 30.0–36.0)
MCV: 94.1 fL (ref 80.0–100.0)
Platelets: 413 10*3/uL — ABNORMAL HIGH (ref 150–400)
RBC: 3.23 MIL/uL — ABNORMAL LOW (ref 3.87–5.11)
RDW: 14.6 % (ref 11.5–15.5)
WBC: 13.2 10*3/uL — ABNORMAL HIGH (ref 4.0–10.5)
nRBC: 0 % (ref 0.0–0.2)

## 2023-09-03 MED ORDER — PREDNISONE 20 MG PO TABS: 20.0000 mg | ORAL_TABLET | Freq: Every day | ORAL | Status: DC

## 2023-09-03 MED ORDER — SODIUM CHLORIDE 0.9 % IV SOLN
2.0000 g | Freq: Once | INTRAVENOUS | Status: AC
  Administered 2023-09-03: 2 g via INTRAVENOUS

## 2023-09-03 MED ORDER — PREDNISONE 20 MG PO TABS: 20.0000 mg | ORAL_TABLET | Freq: Every day | ORAL | Status: AC

## 2023-09-03 MED ORDER — FUROSEMIDE 40 MG PO TABS: 40.0000 mg | ORAL_TABLET | Freq: Every day | ORAL | Status: DC

## 2023-09-03 MED ORDER — MELATONIN 5 MG PO TABS: 5.0000 mg | ORAL_TABLET | Freq: Every day | ORAL | Status: DC

## 2023-09-03 MED ORDER — PREDNISONE 5 MG PO TABS: 10.0000 mg | ORAL_TABLET | Freq: Every day | ORAL | Status: DC

## 2023-09-03 MED ORDER — AMOXICILLIN-POT CLAVULANATE 875-125 MG PO TABS: 1.0000 | ORAL_TABLET | Freq: Two times a day (BID) | ORAL | Status: DC

## 2023-09-03 MED ORDER — GUAIFENESIN ER 600 MG PO TB12: 1200.0000 mg | ORAL_TABLET | Freq: Two times a day (BID) | ORAL | Status: AC

## 2023-09-03 MED ORDER — PREDNISONE 10 MG PO TABS: 10.0000 mg | ORAL_TABLET | Freq: Every day | ORAL | Status: AC

## 2023-09-03 MED ORDER — SODIUM CHLORIDE 0.9 % IV SOLN
2.0000 g | Freq: Once | INTRAVENOUS | Status: DC
  Filled 2023-09-03: qty 20

## 2023-09-03 MED ORDER — PREDNISONE 20 MG PO TABS: 40.0000 mg | ORAL_TABLET | Freq: Every day | ORAL | Status: AC

## 2023-09-03 MED ORDER — DOXYCYCLINE HYCLATE 100 MG PO TABS
100.0000 mg | ORAL_TABLET | Freq: Two times a day (BID) | ORAL | Status: AC
Start: 2023-09-03 — End: 2023-09-06

## 2023-09-03 MED ORDER — POTASSIUM CHLORIDE CRYS ER 20 MEQ PO TBCR: 20.0000 meq | EXTENDED_RELEASE_TABLET | Freq: Every day | ORAL | Status: DC

## 2023-09-03 MED ORDER — KETOROLAC TROMETHAMINE 10 MG PO TABS: 10.0000 mg | ORAL_TABLET | Freq: Four times a day (QID) | ORAL | Status: DC | PRN

## 2023-09-03 NOTE — Telephone Encounter (Signed)
Britta Mccreedy states patient needs one week hospital follow up. Britta Mccreedy phone number is [phone].

## 2023-09-03 NOTE — TOC Transition Note (Signed)
Transition of Care [facility]) - Discharge Note   Patient Details  Name: Stacey [name] MRN: [id number] Date of Birth: Aug 24, 1940  Transition of Care [facility]) CM/SW Contact:  Beckie Busing, RN Phone Number:[phone]  09/03/2023, 11:55 AM   Clinical Narrative:    Patient with discharge orders. Discharge summary has been faxed to [facility] and [place] with Admissions has confirmed that patient can return to facility. RN has been given the info to call report. [facility] Room # [phone]. Daughter at bedside and will transport patient to facility per nurse. There are currently no other TOC needs. TOC will sign off.          Patient Goals and CMS Choice            Discharge Placement                       Discharge Plan and Services Additional resources added to the After Visit Summary for                                       Social Drivers of Health (SDOH) Interventions SDOH Screenings   Food Insecurity: No Food Insecurity (08/30/2023)  Housing: Low Risk  (08/30/2023)  Transportation Needs: No Transportation Needs (08/30/2023)  Utilities: Not At Risk (08/30/2023)  Depression (PHQ2-9): Low Risk  (10/24/2020)  Social Connections: Moderately Integrated (08/30/2023)  Tobacco Use: Low Risk  (08/30/2023)     Readmission Risk Interventions    08/16/2023    2:36 PM  Readmission Risk Prevention Plan  Transportation Screening Complete  PCP or Specialist Appt within 5-7 Days Complete  Home Care Screening Complete  Medication Review (RN CM) Complete

## 2023-09-03 NOTE — Progress Notes (Signed)
Pt A+Ox4, D/C to [facility], Room 606. Report given to [facility], LPN.

## 2023-09-03 NOTE — Telephone Encounter (Signed)
Please advise. There are no openings next week.

## 2023-09-03 NOTE — Progress Notes (Signed)
Speech Language Pathology Treatment: Dysphagia  Patient Details Name: Stacey [name] MRN: [id number] DOB: [birth year] Today's Date: 09/03/2023 Time: 4098-1191 SLP Time Calculation (min) (ACUTE ONLY): 10 min  Assessment / Plan / Recommendation Clinical Impression  Pt seen for skilled ST intervention targeting goals for understanding of safe swallow precautions. Pt in bed with daughter present in the room. Pt awaiting DC. She reports decreased pain as compared to pain on day of BSE. No difficulty swallowing reported. Pt consumed multiple boluses of thin liquid via straw without overt s/s aspiration. Pt was encouraged to sip on water/eat ice chips to increase oral moisture. Pt appears safe to continue current diet. ST signing off at this time. Please reconsult if needs arise.   HPI HPI: 83 yo female adm to [facility] with n/v - pt with recent flu A diagnosis.  Pt with PMH + for reflux as source of pulmonary disease, has h.o ILD, research subject, Chest imaging showed No suspicious hilar adenopathy. Small hiatal hernia with wall thickening of the distal esophagus. MBS 03/2018, normal, cough before and after MBS - ? Reflux.  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 Mild smooth narrowing distal esophagus. 13 mm barium tablet becomes temporarily lodged at this level but clears immediately with subsequent swallowing. Spontaneous reflux to level of the midthoracic esophagus when patient is placed in the supine position. 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 Pt current medication list includes 40 mg protonix and zofran - she endorses being on reflux medication prior to admission. Imaging concerning for possible lower lobe mucoid impaction - may be reactive airway or bronchitis.  Pt also with h/o C5-C6 and C6-C7 mod deg disease.  Pt admits to some dysphagia - causing her to cough with food and liquids - without any rhyme or reason. Pt denies any reflux symptoms.  BSE  ordered by ED MD.      SLP Plan  Discharge SLP treatment due to goals met      Recommendations for follow up therapy are one component of a multi-disciplinary discharge planning process, led by the attending physician.  Recommendations may be updated based on patient status, additional functional criteria and insurance authorization.    Recommendations  Diet recommendations: Regular;Thin liquid Liquids provided via: Cup;Straw Medication Administration: Other (Comment) (as tolerated) Supervision: Patient able to self feed Compensations: Minimize environmental distractions;Slow rate;Small sips/bites Postural Changes and/or Swallow Maneuvers: Seated upright 90 degrees;Upright 30-60 min after meal                  Oral care BID     Dysphagia, unspecified (R13.10)     Discharge SLP treatment due to goals met    Drayden Lukas B. Murvin Natal, [facility], CCC-SLP Speech Language Pathologist  [place] [place] 09/03/2023, 1:58 PM

## 2023-09-03 NOTE — Plan of Care (Signed)

## 2023-09-03 NOTE — Care Management Important Message (Signed)
Important Message  Patient Details IM Letter given. Name: Stacey [name] MRN: [id number] Date of Birth: April 26, 1941   Important Message Given:  Yes - Medicare IM     Caren Macadam 09/03/2023, 11:00 AM

## 2023-09-03 NOTE — Discharge Summary (Signed)
Physician Discharge Summary  Stacey [name]:[id number] DOB: [birth year] DOA: 08/30/2023  PCP: Eloisa Northern, MD  Admit date: 08/30/2023 Discharge date: 09/03/2023  Admitted From: SNF Disposition:  SNF  Discharge Condition:Stable CODE STATUS:DNR Diet recommendation: regular  Brief/Interim Summary: Patient is a 83 year old female with history of ILD, GERD, rheumatoid arthritis, hypertension who presented here with complaint of persistent nausea, vomiting.  She was recently discharged from here to a nursing facility on 08/17/2023 after she was managed for acute hypoxic respiratory failure secondary to influenza A, AKI.  Patient found to be hypoxic on arrival, needing oxygen.  Initial chest x-ray showed features of ILD, no acute findings.  CT chest showed features of bronchitis with bronchial thickening.  Started on Mucomyst, bronchodilators.  Also started on antibiotics for possible community-acquired pneumonia, steroids   Now has been weaned to room air.  PT recommending SNF on discharge.  Medically stable for discharge    Following problems were addressed during the hospitalization:  Acute hypoxic respiratory failure/bronchitis/suspected CAP: Not on oxygen at baseline.  Found to be hypoxic on presentation.Chest imagings as above.  Treated with  Mucomyst, Mucinex, antibiotics.Had leukocytosis, elevated procalcitonin.  Cultures have not shown any growth.  Leukocytosis could have been contributed from steroids. Continue current antibiotics, steroids.  Currently she has been weaned to room air.   History of ILD: Follows with Dr. Marchelle Gearing for ILD, rheumatoid arthritis.  Takes nintedanib, it has been known to cause GI intolerance.  Can resume on dc Has bilateral basal crackles.  Started on steroids.  Continue steroids on tapering dose.  She needs to follow-up with Dr. Marchelle Gearing as an outpatient   Nausea/vomiting: Resolved   Hypokalemia: Continue Supplementation   Right lower extremity  cellulitis: Continue doxycycline for 3 more days.  Was taking Keflex as an outpatient.  Wound care following.   Lower extremity edema: Echo showed EF of 65 to 70%, grade 2 diastolic dysfunction.  Continue Lasix at current dose   Discharge Diagnoses:  Principal Problem:   Hypoxia Active Problems:   Nausea & vomiting   Restrictive lung disease   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 esophagitis   Encephalopathy   Fluid overload   Cellulitis    Discharge Instructions  Discharge Instructions     Diet - low sodium heart healthy   Complete by: As directed    Discharge instructions   Complete by: As directed    1)Please take prescribed medications as instructed 2)Follow up with your pulmonologist as an outpatient in 1 to 2 weeks   Discharge wound care:   Complete by: As directed    As per wound care nurse   Increase activity slowly   Complete by: As directed       Allergies as of 09/03/2023       Reactions   Penicillins Other (See Comments)   Unknown reaction Did it involve swelling of the face/tongue/throat, SOB, or low BP? Unknown Did it involve sudden or severe rash/hives, skin peeling, or any reaction on the inside of your mouth or nose? Unknown Did you need to seek medical attention at a hospital or doctor's office? No When did it last happen?     childhood  If all above answers are "NO", may proceed with cephalosporin use.   Remicade [infliximab] Other (See Comments)   Unknown reaction        Medication List     STOP taking these medications    cephALEXin 500 MG capsule Commonly known as: KEFLEX  TAKE these medications    acetaminophen 500 MG tablet Commonly known as: TYLENOL Take 1,000 mg by mouth every 6 (six) hours as needed for moderate pain (pain score 4-6).   amitriptyline 25 MG tablet Commonly known as: ELAVIL Take 25 mg by mouth at bedtime.   CALCIUM-VITAMIN D PO Take 1 tablet by mouth in the morning and at bedtime. 500-25 mg/mcg   cholecalciferol 25 MCG  (1000 UNIT) tablet Commonly known as: VITAMIN D3 Take 1,000 Units by mouth every evening.   doxycycline 100 MG tablet Commonly known as: VIBRA-TABS Take 1 tablet (100 mg total) by mouth 2 (two) times daily for 3 days.   Ensure Original Liqd Take 1 Bottle by mouth in the morning and at bedtime.   furosemide 40 MG tablet Commonly known as: LASIX Take 1 tablet (40 mg total) by mouth daily. Start taking on: September 04, 2023   gabapentin 300 MG capsule Commonly known as: NEURONTIN Take 600 mg by mouth at bedtime.   guaiFENesin 600 MG 12 hr tablet Commonly known as: MUCINEX Take 2 tablets (1,200 mg total) by mouth 2 (two) times daily for 5 days.   ketorolac 10 MG tablet Commonly known as: TORADOL Take 1 tablet (10 mg total) by mouth every 6 (six) hours as needed.   lidocaine 4 % Place 1 patch onto the skin daily. Apply to coccyx   losartan 25 MG tablet Commonly known as: COZAAR Take 25 mg by mouth daily.   melatonin 5 MG Tabs Take 1 tablet (5 mg total) by mouth at bedtime.   Ofev 100 MG Caps Generic drug: Nintedanib Take 1 capsule (100 mg total) by mouth 2 (two) times daily.   ondansetron 4 MG tablet Commonly known as: ZOFRAN TAKE ONE TABLET BY MOUTH EVERY 8 HOURS AS NEEDED FOR NAUSEA AND VOMITING What changed: See the new instructions.   pantoprazole 40 MG tablet Commonly known as: PROTONIX Take 40 mg by mouth daily.   polyethylene glycol 17 g packet Commonly known as: MIRALAX / GLYCOLAX Take 17 g by mouth daily as needed for mild constipation.   potassium chloride SA 20 MEQ tablet Commonly known as: KLOR-CON M Take 1 tablet (20 mEq total) by mouth daily.   pravastatin 20 MG tablet Commonly known as: PRAVACHOL Take 1 tablet (20 mg total) by mouth daily.   predniSONE 20 MG tablet Commonly known as: DELTASONE Take 2 tablets (40 mg total) by mouth daily with breakfast for 3 days. Start taking on: September 04, 2023   predniSONE 20 MG tablet Commonly known  as: DELTASONE Take 1 tablet (20 mg total) by mouth daily with breakfast for 3 days. Start taking on: September 07, 2023   predniSONE 10 MG tablet Commonly known as: DELTASONE Take 1 tablet (10 mg total) by mouth daily with breakfast for 3 days. Start taking on: September 10, 2023   venlafaxine XR 75 MG 24 hr capsule Commonly known as: EFFEXOR-XR Take 75 mg by mouth daily.               Discharge Care Instructions  (From admission, onward)           Start     Ordered   09/03/23 0000  Discharge wound care:       Comments: As per wound care nurse   09/03/23 1055            Follow-up Information     Kalman Shan, MD. Schedule an appointment as soon as possible for a  visit in 1 week(s).   Specialty: Pulmonary Disease Contact information: [address] [place] [address] [phone]                Allergies  Allergen Reactions   Penicillins Other (See Comments)    Unknown reaction  Did it involve swelling of the face/tongue/throat, SOB, or low BP? Unknown Did it involve sudden or severe rash/hives, skin peeling, or any reaction on the inside of your mouth or nose? Unknown Did you need to seek medical attention at a hospital or doctor's office? No When did it last happen?     childhood  If all above answers are "NO", may proceed with cephalosporin use.     Remicade [Infliximab] Other (See Comments)    Unknown reaction    Consultations: None   Procedures/Studies: ECHOCARDIOGRAM COMPLETE Result Date: 08/31/2023    ECHOCARDIOGRAM REPORT   Patient Name:   CHRYSTIAN RESSLER Date of Exam: 08/31/2023 Medical Rec #:  [id number]         Height:       66.0 in Accession #:    [id number]        Weight:       126.1 lb Date of Birth:  1941/04/22         BSA:          1.644 m Patient Age:    82 years          BP:           126/62 mmHg Patient Gender: F                 HR:           85 bpm. Exam Location:  Inpatient Procedure: 2D Echo, Cardiac  Doppler and Color Doppler (Both Spectral and Color            Flow Doppler were utilized during procedure). Indications:    CHF-Acute Systolic I50.21  History:        Patient has prior history of Echocardiogram examinations, most                 recent 12/18/2018. Signs/Symptoms:Dyspnea.  Sonographer:    Webb Laws Referring Phys: [id number] HERSH GOEL IMPRESSIONS  1. Left ventricular ejection fraction, by estimation, is 65 to 70%. The left ventricle has normal function. The left ventricle has no regional wall motion abnormalities. Left ventricular diastolic parameters are consistent with Grade II diastolic dysfunction (pseudonormalization).  2. Right ventricular systolic function is normal. The right ventricular size is normal.  3. Left atrial size was mildly dilated.  4. The mitral valve is normal in structure. Trivial mitral valve regurgitation. No evidence of mitral stenosis.  5. Isolated calcification of the NCC Leaflet. The aortic valve is tricuspid. There is mild calcification of the aortic valve. Aortic valve regurgitation is not visualized. Aortic valve sclerosis is present, with no evidence of aortic valve stenosis.  6. Aortic dilatation noted. There is mild dilatation of the ascending aorta, measuring 40 mm.  7. The inferior vena cava is dilated in size with >50% respiratory variability, suggesting right atrial pressure of 8 mmHg. Comparison(s): No significant change from prior study. FINDINGS  Left Ventricle: Left ventricular ejection fraction, by estimation, is 65 to 70%. The left ventricle has normal function. The left ventricle has no regional wall motion abnormalities. Strain imaging was not performed. The left ventricular internal cavity  size was normal in size. There is no left ventricular hypertrophy. Left  ventricular diastolic parameters are consistent with Grade II diastolic dysfunction (pseudonormalization). Right Ventricle: The right ventricular size is normal. No increase in right  ventricular wall thickness. Right ventricular systolic function is normal. Left Atrium: Left atrial size was mildly dilated. Right Atrium: Right atrial size was normal in size. Prominent Eustachian valve. Pericardium: There is no evidence of pericardial effusion. Mitral Valve: The mitral valve is normal in structure. Trivial mitral valve regurgitation. No evidence of mitral valve stenosis. Tricuspid Valve: The tricuspid valve is normal in structure. Tricuspid valve regurgitation is mild . No evidence of tricuspid stenosis. Aortic Valve: Isolated calcification of the NCC Leaflet. The aortic valve is tricuspid. There is mild calcification of the aortic valve. Aortic valve regurgitation is not visualized. Aortic valve sclerosis is present, with no evidence of aortic valve stenosis. Pulmonic Valve: The pulmonic valve was normal in structure. Pulmonic valve regurgitation is not visualized. No evidence of pulmonic stenosis. Aorta: Aortic dilatation noted. There is mild dilatation of the ascending aorta, measuring 40 mm. Venous: The inferior vena cava is dilated in size with greater than 50% respiratory variability, suggesting right atrial pressure of 8 mmHg. IAS/Shunts: The atrial septum is grossly normal. Additional Comments: 3D imaging was not performed.  LEFT VENTRICLE PLAX 2D LVIDd:         4.50 cm   Diastology LVIDs:         2.80 cm   LV e' medial:    5.33 cm/s LV PW:         1.00 cm   LV E/e' medial:  18.9 LV IVS:        0.90 cm   LV e' lateral:   8.59 cm/s LVOT diam:     1.90 cm   LV E/e' lateral: 11.8 LV SV:         70 LV SV Index:   43 LVOT Area:     2.84 cm  RIGHT VENTRICLE             IVC RV Basal diam:  3.20 cm     IVC diam: 2.20 cm RV S prime:     14.70 cm/s TAPSE (M-mode): 2.0 cm LEFT ATRIUM             Index        RIGHT ATRIUM           Index LA diam:        4.00 cm 2.43 cm/m   RA Area:     12.10 cm LA Vol (A2C):   59.5 ml 36.20 ml/m  RA Volume:   23.90 ml  14.54 ml/m LA Vol (A4C):   46.4 ml 28.23  ml/m LA Biplane Vol: 55.6 ml 33.82 ml/m  AORTIC VALVE LVOT Vmax:   113.00 cm/s LVOT Vmean:  87.000 cm/s LVOT VTI:    0.247 m  AORTA Ao Root diam: 3.00 cm Ao Asc diam:  3.90 cm MITRAL VALVE                TRICUSPID VALVE MV Area (PHT): 4.06 cm     TR Peak grad:   35.8 mmHg MV Decel Time: 187 msec     TR Vmax:        299.00 cm/s MV E velocity: 101.00 cm/s MV A velocity: 100.00 cm/s  SHUNTS MV E/A ratio:  1.01         Systemic VTI:  0.25 m  Systemic Diam: 1.90 cm Riley Lam MD Electronically signed by Riley Lam MD Signature Date/Time: 08/31/2023/5:28:23 PM    Final    DG Sacrum/Coccyx Result Date: 08/30/2023 CLINICAL DATA:  Recent fall, pain. EXAM: SACRUM AND COCCYX - 2+ VIEW COMPARISON:  Reformats from 06/21/2023 CT FINDINGS: No evidence of acute fracture. The cortical margins of the sacrum and coccyx are intact. Sacral ala are maintained. No sacroiliac diastasis. Excreted IV contrast within the distal ureters from prior chest CTA. IMPRESSION: No evidence of sacrococcygeal fracture. Electronically Signed   By: Narda Rutherford M.D.   On: 08/30/2023 15:26   CT Angio Chest PE W and/or Wo Contrast Result Date: 08/30/2023 CLINICAL DATA:  Shortness of breath. EXAM: CT ANGIOGRAPHY CHEST WITH CONTRAST TECHNIQUE: Multidetector CT imaging of the chest was performed using the standard protocol during bolus administration of intravenous contrast. Multiplanar CT image reconstructions and MIPs were obtained to evaluate the vascular anatomy. RADIATION DOSE REDUCTION: This exam was performed according to the departmental dose-optimization program which includes automated exposure control, adjustment of the mA and/or kV according to patient size and/or use of iterative reconstruction technique. CONTRAST:  75mL OMNIPAQUE IOHEXOL 350 MG/ML SOLN COMPARISON:  Radiograph earlier today.  Chest CT 06/21/2023 FINDINGS: Cardiovascular: There are no filling defects within the pulmonary  arteries to suggest pulmonary embolus. Heart is upper normal in size. There are coronary artery calcifications. Diffuse aortic atherosclerosis. 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 No pericardial effusion. Mediastinum/Nodes: No enlarged mediastinal lymph nodes. No suspicious hilar adenopathy. Small hiatal hernia with wall thickening of the distal esophagus. Lungs/Pleura: Chronic interstitial lung disease with subpleural reticulation and ground-glass. There is biapical pleuroparenchymal scarring. Moderate bronchial thickening with areas of mucoid impaction in the lower lobes. No pleural effusion. Small calcified granuloma in the right upper lobe, needing no further imaging follow-up. Upper Abdomen: No acute findings. Cholecystectomy. Mild chronic mid lower thoracic compression deformities. Remote bilateral rib fractures. There are no acute or suspicious osseous abnormalities. Musculoskeletal: Review of the MIP images confirms the above findings. IMPRESSION: 1. No pulmonary embolus. 2. Chronic interstitial lung disease, unchanged from prior. 3. Moderate bronchial thickening with areas of mucoid impaction in the lower lobes, can be seen with bronchitis or reactive airways disease. 4. Small hiatal hernia with wall thickening of the distal esophagus, query esophagitis. Aortic Atherosclerosis (ICD10-I70.0). Electronically Signed   By: Narda Rutherford M.D.   On: 08/30/2023 15:25   DG Chest Port 1 View Result Date: 08/30/2023 CLINICAL DATA:  Questionable sepsis - evaluate for abnormality Shortness of breath. EXAM: PORTABLE CHEST 1 VIEW COMPARISON:  Radiograph 08/15/2023. Subsequent chest CT available at time of radiograph interpretation. FINDINGS: Stable heart size and mediastinal contours. Aortic atherosclerosis. Sequela of chronic interstitial lung disease with subpleural reticulation. No superimposed acute airspace disease. No pleural fluid or pneumothorax. IMPRESSION: Sequela of chronic  interstitial lung disease. No superimposed acute finding. Electronically Signed   By: Narda Rutherford M.D.   On: 08/30/2023 15:19   DG Abd 1 View Result Date: 08/16/2023 CLINICAL DATA:  Nausea, vomiting EXAM: ABDOMEN - 1 VIEW COMPARISON:  None Available. FINDINGS: Normal abdominal gas pattern. Moderate stool burden. Cholecystectomy clips are seen in the right upper quadrant. Underpenetration precludes evaluation of the visceral shadows. Vascular calcifications are noted. Osseous structures are age-appropriate with degenerative changes noted at the lumbosacral junction and asymmetrically within the left hip. IMPRESSION: 1. Moderate stool burden. No obstruction. Electronically Signed   By: Helyn Numbers M.D.   On: 08/16/2023 03:11  DG Pelvis 1-2 Views Result Date: 08/16/2023 CLINICAL DATA:  Fall. EXAM: PELVIS - 1-2 VIEW COMPARISON:  10/08/2021 FINDINGS: Degenerative changes in the lower lumbar spine and both hips. Pelvis and hips appear intact. No evidence of acute fracture or dislocation. No focal bone lesion or bone destruction. SI joints and symphysis pubis are not displaced. IMPRESSION: Degenerative changes in the hips.  No acute bony abnormalities. Electronically Signed   By: Burman Nieves M.D.   On: 08/16/2023 01:41   DG Chest Port 1 View Result Date: 08/15/2023 CLINICAL DATA:  Shortness of breath. Cough, congestion, and fatigue. Negative COVID test. Wheezing. Decreased oxygenation. EXAM: PORTABLE CHEST 1 VIEW COMPARISON:  06/24/2023 FINDINGS: Heart size and pulmonary vascularity are normal. Bronchiectasis and interstitial changes in the lungs likely representing chronic bronchitis and fibrosis. Similar appearance to previous studies. No airspace disease or consolidation. No pleural effusion or pneumothorax. Mediastinal contours appear intact. Old right rib fractures. Surgical clips in the right upper quadrant. Calcification of the aorta. IMPRESSION: Chronic bronchitic changes in the lungs.  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 No focal consolidation. Electronically Signed   By: Burman Nieves M.D.   On: 08/15/2023 21:41      Subjective: Patient seen and examined at bedside today.  Hemodynamically stable.  Medically stable for discharge.  I have discussed discharge planning with her daughter at bedside on 2/20  Discharge Exam: Vitals:   09/03/23 0843 09/03/23 1010  BP:  (!) 141/77  Pulse:  89  Resp:    Temp:    SpO2: 96% 97%   Vitals:   09/02/23 2040 09/03/23 0524 09/03/23 0843 09/03/23 1010  BP: (!) 148/67 (!) 163/82  (!) 141/77  Pulse: 81 73  89  Resp: 18 18    Temp: 98.2 F (36.8 C) 98.3 F (36.8 C)    TempSrc: Oral Oral    SpO2: 93% 92% 96% 97%  Weight:      Height:        General: Pt is alert, awake, not in acute distress, chronically weak and deconditioned Cardiovascular: RRR, S1/S2 +, no rubs, no gallops Respiratory: CTA bilaterally, no wheezing, no rhonchi Abdominal: Soft, NT, ND, bowel sounds + Extremities: no edema, no cyanosis    The results of significant diagnostics from this hospitalization (including imaging, microbiology, ancillary and laboratory) are listed below for reference.     Microbiology: Recent Results (from the past 240 hours)  Blood Culture (routine x 2)     Status: None (Preliminary result)   Collection Time: 08/30/23 11:30 AM   Specimen: BLOOD  Result Value Ref Range Status   Specimen Description   Final    BLOOD LEFT ANTECUBITAL Performed at [facility], [address] [place], [place] [address]    Special Requests   Final    BOTTLES DRAWN AEROBIC AND ANAEROBIC Blood Culture adequate volume Performed at [name], [address], [place], [place] [address]    Culture   Final    NO GROWTH 4 DAYS Performed at [facility], [address] [place], [place] [address]    Report Status PENDING  Incomplete  Blood Culture (routine x 2)     Status: None (Preliminary result)    Collection Time: 08/30/23 11:34 AM   Specimen: BLOOD RIGHT ARM  Result Value Ref Range Status   Specimen Description   Final    BLOOD RIGHT ARM Performed at [facility], [address], [place], [place] [address]  Special Requests   Final    BOTTLES DRAWN AEROBIC AND ANAEROBIC Blood Culture adequate volume Performed at [facility], [address], [place], [place] [address]    Culture   Final    NO GROWTH 4 DAYS Performed at [facility], [address], [place], [place] [address]    Report Status PENDING  Incomplete  Resp panel by RT-PCR (RSV, Flu A&B, Covid) Anterior Nasal Swab     Status: None   Collection Time: 08/30/23  4:17 PM   Specimen: Anterior Nasal Swab  Result Value Ref Range Status   SARS Coronavirus 2 by RT PCR NEGATIVE NEGATIVE Final    Comment: (NOTE) SARS-CoV-2 target nucleic acids are NOT DETECTED.  The SARS-CoV-2 RNA is generally detectable in upper respiratory specimens during the acute phase of infection. The lowest concentration of SARS-CoV-2 viral copies this assay can detect is 138 copies/mL. A negative result does not preclude SARS-Cov-2 infection and should not be used as the sole basis for treatment or other patient management decisions. A negative result may occur with  improper specimen collection/handling, submission of specimen other than nasopharyngeal swab, presence of viral mutation(s) within the areas targeted by this assay, and inadequate number of viral copies(<138 copies/mL). A negative result must be combined with clinical observations, patient history, and epidemiological information. The expected result is Negative.  Fact Sheet for Patients:  BloggerCourse.com  Fact Sheet for Healthcare Providers:  SeriousBroker.it  This test is no t yet approved or cleared by the [place] FDA and  has been authorized for detection and/or diagnosis of  SARS-CoV-2 by FDA under an Emergency Use Authorization (EUA). This EUA will remain  in effect (meaning this test can be used) for the duration of the COVID-19 declaration under Section 564(b)(1) of the Act, 21 U.S.C.section 360bbb-3(b)(1), unless the authorization is terminated  or revoked sooner.       Influenza A by PCR NEGATIVE NEGATIVE Final   Influenza B by PCR NEGATIVE NEGATIVE Final    Comment: (NOTE) The Xpert Xpress SARS-CoV-2/FLU/RSV plus assay is intended as an aid in the diagnosis of influenza from Nasopharyngeal swab specimens and should not be used as a sole basis for treatment. Nasal washings and aspirates are unacceptable for Xpert Xpress SARS-CoV-2/FLU/RSV testing.  Fact Sheet for Patients: BloggerCourse.com  Fact Sheet for Healthcare Providers: SeriousBroker.it  This test is not yet approved or cleared by the [place] FDA and has been authorized for detection and/or diagnosis of SARS-CoV-2 by FDA under an Emergency Use Authorization (EUA). This EUA will remain in effect (meaning this test can be used) for the duration of the COVID-19 declaration under Section 564(b)(1) of the Act, 21 U.S.C. section 360bbb-3(b)(1), unless the authorization is terminated or revoked.     Resp Syncytial Virus by PCR NEGATIVE NEGATIVE Final    Comment: (NOTE) Fact Sheet for Patients: BloggerCourse.com  Fact Sheet for Healthcare Providers: SeriousBroker.it  This test is not yet approved or cleared by the [place] FDA and has been authorized for detection and/or diagnosis of SARS-CoV-2 by FDA under an Emergency Use Authorization (EUA). This EUA will remain in effect (meaning this test can be used) for the duration of the COVID-19 declaration under Section 564(b)(1) of the Act, 21 U.S.C. section 360bbb-3(b)(1), unless the authorization is terminated  or revoked.  Performed at [facility], [address] [place], [place] [address]      Labs: BNP (last 3 results) Recent Labs  08/30/23 1122  BNP 91.5   Basic Metabolic Panel: Recent Labs  Lab 08/30/23 1122 08/30/23 1632 08/30/23 1825 08/31/23 0555 09/01/23 0604 09/02/23 0549  NA 131* 132*  --  134* 137 138  K 3.9 4.1  --  3.7 3.3* 3.7  CL 94* 97*  --  101 104 101  CO2 25  --   --  27 28 28   GLUCOSE 96 116*  --  157* 93 126*  BUN 13 11  --  16 18 15   CREATININE 0.77 0.80 0.78 0.68 0.84 0.54  CALCIUM 8.1*  --   --  7.6* 7.5* 7.7*   Liver Function Tests: Recent Labs  Lab 08/30/23 1122  AST 29  ALT 18  ALKPHOS 119  BILITOT 0.6  PROT 5.7*  ALBUMIN 2.8*   Recent Labs  Lab 08/30/23 1825  LIPASE 18   No results for input(s): "AMMONIA" in the last 168 hours. CBC: Recent Labs  Lab 08/30/23 1122 08/30/23 1632 08/30/23 1825 08/31/23 0555 09/01/23 0604 09/02/23 0549 09/03/23 0548  WBC 16.5*  --  14.9* 9.0 16.5* 14.3* 13.2*  NEUTROABS 10.6*  --   --   --   --   --   --   HGB 11.2*   < > 9.8* 9.2* 8.9* 9.0* 9.2*  HCT 35.1*   < > 31.3* 29.2* 28.2* 28.4* 30.4*  MCV 90.5  --  91.3 91.3 91.9 91.3 94.1  PLT 481*  --  390 376 401* 409* 413*   < > = values in this interval not displayed.   Cardiac Enzymes: No results for input(s): "CKTOTAL", "CKMB", "CKMBINDEX", "TROPONINI" in the last 168 hours. BNP: Invalid input(s): "POCBNP" CBG: No results for input(s): "GLUCAP" in the last 168 hours. D-Dimer No results for input(s): "DDIMER" in the last 72 hours. Hgb A1c No results for input(s): "HGBA1C" in the last 72 hours. Lipid Profile No results for input(s): "CHOL", "HDL", "LDLCALC", "TRIG", "CHOLHDL", "LDLDIRECT" in the last 72 hours. Thyroid function studies No results for input(s): "TSH", "T4TOTAL", "T3FREE", "THYROIDAB" in the last 72 hours.  Invalid input(s): "FREET3" Anemia work up No results for input(s): "VITAMINB12", "FOLATE",  "FERRITIN", "TIBC", "IRON", "RETICCTPCT" in the last 72 hours. Urinalysis    Component Value Date/Time   COLORURINE YELLOW 08/16/2023 0050   APPEARANCEUR HAZY (A) 08/16/2023 0050   LABSPEC 1.024 08/16/2023 0050   PHURINE 5.0 08/16/2023 0050   GLUCOSEU NEGATIVE 08/16/2023 0050   HGBUR MODERATE (A) 08/16/2023 0050   BILIRUBINUR NEGATIVE 08/16/2023 0050   KETONESUR 20 (A) 08/16/2023 0050   PROTEINUR 30 (A) 08/16/2023 0050   UROBILINOGEN 0.2 12/18/2009 0120   NITRITE NEGATIVE 08/16/2023 0050   LEUKOCYTESUR MODERATE (A) 08/16/2023 0050   Sepsis Labs Recent Labs  Lab 08/31/23 0555 09/01/23 0604 09/02/23 0549 09/03/23 0548  WBC 9.0 16.5* 14.3* 13.2*   Microbiology Recent Results (from the past 240 hours)  Blood Culture (routine x 2)     Status: None (Preliminary result)   Collection Time: 08/30/23 11:30 AM   Specimen: BLOOD  Result Value Ref Range Status   Specimen Description   Final    BLOOD LEFT ANTECUBITAL Performed at [facility], [address], [place], [place] [address]    Special Requests   Final    BOTTLES DRAWN AEROBIC AND ANAEROBIC Blood Culture adequate volume Performed at [facility], [address], [place], [place] [address]    Culture   Final    NO GROWTH 4 DAYS Performed  at [facility], [address] [place], [place] [address]    Report Status PENDING  Incomplete  Blood Culture (routine x 2)     Status: None (Preliminary result)   Collection Time: 08/30/23 11:34 AM   Specimen: BLOOD RIGHT ARM  Result Value Ref Range Status   Specimen Description   Final    BLOOD RIGHT ARM Performed at [facility], [address] [place], [place] [address]    Special Requests   Final    BOTTLES DRAWN AEROBIC AND ANAEROBIC Blood Culture adequate volume Performed at [facility], [address], [place], [place] [address]    Culture   Final    NO GROWTH 4 DAYS Performed at [facility], [address], [place], [place] [address]    Report Status PENDING  Incomplete  Resp panel by RT-PCR (RSV, Flu A&B, Covid) Anterior Nasal Swab     Status: None   Collection Time: 08/30/23  4:17 PM   Specimen: Anterior Nasal Swab  Result Value Ref Range Status   SARS Coronavirus 2 by RT PCR NEGATIVE NEGATIVE Final    Comment: (NOTE) SARS-CoV-2 target nucleic acids are NOT DETECTED.  The SARS-CoV-2 RNA is generally detectable in upper respiratory specimens during the acute phase of infection. The lowest concentration of SARS-CoV-2 viral copies this assay can detect is 138 copies/mL. A negative result does not preclude SARS-Cov-2 infection and should not be used as the sole basis for treatment or other patient management decisions. A negative result may occur with  improper specimen collection/handling, submission of specimen other than nasopharyngeal swab, presence of viral mutation(s) within the areas targeted by this assay, and inadequate number of viral copies(<138 copies/mL). A negative result must be combined with clinical observations, patient history, and epidemiological information. The expected result is Negative.  Fact Sheet for Patients:  BloggerCourse.com  Fact Sheet for Healthcare Providers:  SeriousBroker.it  This test is no t yet approved or cleared by the [place] FDA and  has been authorized for detection and/or diagnosis of SARS-CoV-2 by FDA under an Emergency Use Authorization (EUA). This EUA will remain  in effect (meaning this test can be used) for the duration of the COVID-19 declaration under Section 564(b)(1) of the Act, 21 U.S.C.section 360bbb-3(b)(1), unless the authorization is terminated  or revoked sooner.       Influenza A by PCR NEGATIVE NEGATIVE Final   Influenza B by PCR NEGATIVE NEGATIVE Final    Comment: (NOTE) The Xpert Xpress SARS-CoV-2/FLU/RSV plus assay is intended as an aid in the  diagnosis of influenza from Nasopharyngeal swab specimens and should not be used as a sole basis for treatment. Nasal washings and aspirates are unacceptable for Xpert Xpress SARS-CoV-2/FLU/RSV testing.  Fact Sheet for Patients: BloggerCourse.com  Fact Sheet for Healthcare Providers: SeriousBroker.it  This test is not yet approved or cleared by the [place] FDA and has been authorized for detection and/or diagnosis of SARS-CoV-2 by FDA under an Emergency Use Authorization (EUA). This EUA will remain in effect (meaning this test can be used) for the duration of the COVID-19 declaration under Section 564(b)(1) of the Act, 21 U.S.C. section 360bbb-3(b)(1), unless the authorization is terminated or revoked.     Resp Syncytial Virus by PCR NEGATIVE NEGATIVE Final    Comment: (NOTE) Fact Sheet for Patients: BloggerCourse.com  Fact Sheet for Healthcare Providers: SeriousBroker.it  This test is not yet approved or cleared by the [place] and  has been authorized for detection and/or diagnosis of SARS-CoV-2 by FDA under an Emergency Use Authorization (EUA). This EUA will remain in effect (meaning this test can be used) for the duration of the COVID-19 declaration under Section 564(b)(1) of the Act, 21 U.S.C. section 360bbb-3(b)(1), unless the authorization is terminated or revoked.  Performed at [facility], [address] [place], [place] [address]     Please note: You were cared for by a hospitalist during your hospital stay. Once you are discharged, your primary care physician will handle any further medical issues. Please note that NO REFILLS for any discharge medications will be authorized once you are discharged, as it is imperative that you return to your primary care physician (or establish a relationship with a primary care physician if you do  not have one) for your post hospital discharge needs so that they can reassess your need for medications and monitor your lab values.    Time coordinating discharge: 40 minutes  SIGNED:   Burnadette Pop, MD  [facility] 09/03/2023, 10:55 AM Pager [phone]  If 7PM-7AM, please contact night-coverage [email or web address] Password [email or web address]

## 2023-09-04 LAB — CULTURE, BLOOD (ROUTINE X 2)
Culture: NO GROWTH
Culture: NO GROWTH
Special Requests: ADEQUATE
Special Requests: ADEQUATE

## 2023-09-06 ENCOUNTER — Other Ambulatory Visit: Payer: Self-pay | Admitting: *Deleted

## 2023-09-06 DIAGNOSIS — E785 Hyperlipidemia, unspecified: Secondary | ICD-10-CM | POA: Diagnosis not present

## 2023-09-06 DIAGNOSIS — F329 Major depressive disorder, single episode, unspecified: Secondary | ICD-10-CM | POA: Diagnosis not present

## 2023-09-06 DIAGNOSIS — S2241XD Multiple fractures of ribs, right side, subsequent encounter for fracture with routine healing: Secondary | ICD-10-CM | POA: Diagnosis not present

## 2023-09-06 DIAGNOSIS — E43 Unspecified severe protein-calorie malnutrition: Secondary | ICD-10-CM | POA: Diagnosis not present

## 2023-09-06 NOTE — Patient Outreach (Signed)
 Post- Acute Care Manager follow up . Stacey [name] resides in [facility]. [facility]F waiver was recently utilized for admission to [facility] on 08/17/23. She has since readmitted to hospital.   Spoke with [place], [facility] social worker. Stacey [name] has returned to [facility] She resided in [facility] prior to SNF admission.   Will continue to follow.   Raiford Noble, MSN, RN, BSN [place]  [facility] RN Post- Acute Care Manager Direct Dial: [phone]

## 2023-09-07 NOTE — Telephone Encounter (Signed)
 09/09/23 4:30pm ?

## 2023-09-07 NOTE — Telephone Encounter (Signed)
 Spoke to pt and scheduled appt 09/09/2023 at 4:30. Nothing further needed

## 2023-09-09 ENCOUNTER — Ambulatory Visit: Payer: Medicare Other | Admitting: Internal Medicine

## 2023-09-09 ENCOUNTER — Encounter: Payer: Self-pay | Admitting: Internal Medicine

## 2023-09-09 VITALS — BP 147/67 | HR 72 | Temp 98.2°F | Ht 66.0 in | Wt 124.0 lb

## 2023-09-09 DIAGNOSIS — Z09 Encounter for follow-up examination after completed treatment for conditions other than malignant neoplasm: Secondary | ICD-10-CM

## 2023-09-09 DIAGNOSIS — J9601 Acute respiratory failure with hypoxia: Secondary | ICD-10-CM | POA: Diagnosis not present

## 2023-09-09 NOTE — Patient Instructions (Addendum)
    Interstitial lung disease due to connective tissue disease (HCC) Encounter for long-term current use of high risk medication  -Seems you are tolerating Ofev rechallenge now with taking Zofran twice daily on a scheduled basis but unclear why you have vomioting 08/30/23  - doubt ofev but cannot rule out - PFT slowly worse over time but unclera if worse x 1 year   Plan - STOP OFEV x 2 weeks  -then restart ofev 100mg  once daily x 1 week and then twice daily - can take zofran 4mg  twice daily  - -Continue  steroids  prednisome 3mg  per day through the rheumatologist - NO Rituxan for another month atleast  - sending message to [facility] to see if you still need rituxan and if you do if we can do it at our office location - continuie  breo 100 dosing daily once with albuterol as needed - Hold off AVALYN or TETON 305 study evaluation for another 1-3 months du to recent flu  Asthma, unspecified asthma severity, unspecified whether complicated, unspecified whether persistent  - stable  Plan  -continue breo scheduled with albuteraol as neede - if worse call for higher dose prednisoe  PEDAL EDEMA x few months ECHO - no pulmonary hypertension Feb 2025 BNP - normal FEb 2025  Plan - Per PCP Eloisa Northern, MD   Followup  1 month Future Appointments  Date Time Provider Department Center  09/24/2023 10:00 AM [facility]  10/07/2023  4:30 PM Kalman Shan, MD [facility] None  11/09/2023 10:00 AM GI-315 [facility] 2 GI-315CT [facility] [facility] [facility]  11/12/2023  8:30 AM [facility] PFT ROOM [facility] None  11/12/2023  9:15 AM Kalman Shan, MD [facility] None

## 2023-09-09 NOTE — Progress Notes (Signed)
 Brief patient profile:  8 yowf  never smoker with allergies/inhalers as child outgrew by [employer] then  RA since around 2000  Prednisone  x decades and prev eval by Dr Delford Field around 2004 for sob resolved s maint rx and referred 05/26/2013 by Dr Renne Crigler for bronchitis and abn cxr   History of Present Illness  05/26/2013 1st [facility] office visit/ Wert cc June 2014 dx pna  In [place] and remicade stopped and 100% better and placed arencia in September 2014  then abruptly worse first week in November with cough green sputum s nasal symptoms, fever low grade and no cp or cough and completely recovered prior to OV does not recall abx but issue is why keeps getting sick and abn CT Chest (see below).   Arthritis symptoms well controlled at present on Rx for RA rec Nexium 40 mg Take 30-60 min before first meal of the day and add pepcid 20 mg one at bedtime whenever coughing.     10/19/2017  f/u ov/Wert re:  RA lung dz  Chief Complaint  Patient presents with   Follow-up    Cough is much improved, but has not resolved yet. Cough is non prod. She has not had to use her neb.   Dyspnea:  Not limited by breathing from desired activities  But some doe x steps Cough: daytime > noct dry  Sleep: fine  SABA use:  No saba Medrol 4 mg a/w 2mg  per day/ ok control of arthritis  rec Start back on gabapentin up to 300 mg each am  in addition to the the two at bedtime  If not better increase the medrol to 8 mg daily until bettter then taper back to where you      01/10/2018  f/u ov/Wert re:  RA  Lung dz Medrol  4 mg  One alternating with a half Chief Complaint  Patient presents with   Follow-up    PFT's done. Her breathing has been gradually worsening since the last visit. She has occ cough- non prod.   Dyspnea gradually worse since last ov:  MMRC1 =  MMRC3 = can't walk 100 yards even at a slow pace at a flat grade s stopping due to sob    Gradually x 3 m / more fatigue / no change in  arthritis  Cough: not an issue rec Protonix 40 mg Take 30-60 min before first meal of the day  GERD diet   01/17/2018 acute extended ov/Wert re: cough on medrol 4 mg  One a/w one half  Chief Complaint  Patient presents with   Acute Visit    started coughing 01/11/18- occ prod with minimal green sputum.  She states also wheezing and having increased SOB.    abruptly worse 01/11/18 with severe 24/7 coughing >>  prod min green mucus esp in am/ assoc with subjective wheeze and did not follow previous contingencies re flutter / saba/ increase medrol and admits she does not rember those written instructions nor how to use the neb provided .  No fever/ comfortable at rest sitting  rec For cough > mucinex dm 1200 mg every 12 hours and cough into the flutter valve as much as possible  Doxycycline 100 mg twice daily x 10 days with glass of water Medrol 4mg  x 2 now and take 2  daily until cough is better then 1 daily x 5 days and then resume the previous dose  Shortness of breath/ wheezing/  still coughing > albuterol neb every 4 hours as needed      01/20/2018 acute extended ov/Wert re: refractory cough and sob 01/11/18 Chief Complaint  Patient presents with   Acute Visit    she is not feeling better, coughing , very SOB, wheezing  mucus now clear/scant  on doxy/ neb machine not working (tube would not plug into the side s adequate force and she was not capable of applying it due to RA hands. Cough/ wheeze/ sob 24/7 / flutter not helping/ can't lie down at hs   rec While coughing protonix 40 mg Take 30- 60 min before your first and last meals of the day  Shortness of breath/ wheezing/ still coughing > albuterol neb every 4 hours as needed  Depomedrol 120 mg IM and medrol 32 mg daily x 2 days,  then 16 mg x 3 days,  Then 8 mg x 4 days , then resume the 4 mg daily  For severe cough > tylenol 3# one every 4 hours if needed  Go to ER if condition worsens on above plan       Date of admission: 01/22/2018              Date of discharge: 01/27/2018   History of present illness: As per the H and P dictated on admission, " Stacey [name]  is a 83 y.o. Stacey [name], w Rheumatoid arthritis, ILD Asthma, apparently c/o increase in dyspnea this evening. Dry cough.   Denies fever, chills, cp, palp,  N/v, diarrhea, brbpr, black stool.   Pt notes recently being given steroid injection in office as well as being placed on doxycycline. This might have helped slightly but pt worse  Hospital Course:  Summary of her active problems in the hospital is as following. 1 dyspnea/hypoxemia/ILD Concerned that likely GERD Is causing ILD. Patient with cough.   Patient with complaints of awakening with cough and also with oral intake which is slightly improved since 01/24/2018. assessed by speech therapy and speech therapy raising concern of esophageal component but no signs of aspiration.   2D echo with a EF of 55 to 60% with no wall motion abnormalities, grade 1 diastolic dysfunction.  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 Pulmonary felt that the patient should be on scheduled Reglan. I have placed the patient on scheduled potassium before sleep. Continue steroids on discharge continue Mucinex and Claritin as well as inhalers. Patient will follow-up with pulmonary outpatient   2.  Gastroesophageal reflux disease Continue PPI and H2 blocker.  I changed PPI to [facility].   3.  Rheumatoid arthritis Outpatient follow-up.     4.  Anxiety Continue Effexor.       All other chronic medical condition were stable during the hospitalization.  Patient was ambulatory without any assistance. On the day of the discharge the patient's vitals were stable , and no other acute medical condition were reported by patient. the patient was felt safe to be discharge at home with family.   Consultants: PCCM  Procedures: Echocardiogram       03/21/2018  f/u ov/Wert re:   S/p admit was transiently  better  and downhill since Labor day on medrol 4 mg daily  Last orencia on Sept 4th 2019  Chief Complaint  Patient presents with   Acute Visit    Per patient, she has had a dry cough since July 2019. She has been wheezing as well. Increased fatigue. Body aches. Denies any fever or chills.  Dyspnea:  MMRC4  = sob if tries to leave home or while getting dressed   Cough: harsh/ hacking mostly dry/ has flutter not using    SABA use: not much better with rx   No obvious day to day or daytime variability or assoc excess/ purulent sputum or mucus plugs or hemoptysis or cp or chest tightness, subjective wheeze or overt sinus or hb symptoms.     Also denies any obvious fluctuation of symptoms with weather or environmental changes or other aggravating or alleviating factors except as outlined above   No unusual exposure hx or h/o childhood pna/ asthma or knowledge of premature birth.   INpatient consult 03/26/18 83 year old with rheumatoid arthoritis.At baseline the patient lives at home with her husband and is independent of ADLs.  Has been on many immune suppressants over decades and curently on orencia x 4 year and prednisone. Does not recollect being on bactrim/dapsone. Chart mentions BOOP/MAI in 2001 but she denies this. 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 Got admitted. Rx with steroids. Per Notes - clnical suspicion of  arytenoid inflmmation related wheeze noticed (she also reports asthma NOS). Follolwup with ENT recommended (but not seen one as yet). She also appears to have passed swallow with rec for regular diet with thin liquids but did to have mild eso stricture and reflux during testing . PFT shows 10% FVC decline for first ime. CT chest at this tme (aug 2019) showed new rLL infiltrate.  ECHO July 2019 without evicence of elevated  PASP and saw cards [facility] June 2019 and was considered to have worsenin dyspnea due to [facility] issues (reports stress test at [facility] that was normal but I cannot see it)   She reports after discharge she got better but in last several weeks has deteriorated with cough and dyspnea. There is new hypoxemia (currently RA with nail polish and poor circulation  - 89% pulse ox) needing 2L [place]. Per [facility] improved with steroids and abx. CTA 03/24/2018 => shows that RLL inifltrate has improved . Other chronic ILD changes + and small  Hiatal hernia + witthout change.   Review of lab work does show eosinophilia at time of admision   EVENTS 03/21/18 - IgE -5, blood allegy panel - negative, 03/23/2018 - - admit . HIGH EOS 2300, ESR 48, BNP 89 , HIV neg 9/12- PCT negative, RVP negative 9/14 -  PCT < 0.1, Urine strep - negative, MRSA PCR - positive. IgE - normal 4, Blood allergy panel repeat - negative 03/26/2018 - leading consideration for airway (BO in RA +/- asthma) related flare either due to MRSA bronchitis or clinical suspicion of arytenoid inflmmation +/- GERD relatd flare (she has small hiatal hernia)  +/- ? Dysphagia  up causing mild hypoxemia acute resp failure, wheeze . Allergy and IgE blood work negative thought. Patient reports being better but says she is choking on drinkin water [facility] MD says wheeze improved significantly with steroids.  RN says was down to RA yesterday evening but needed 1L [place] at sleep. Today -Room air at rest 94% and desaturated to 86% walking 60 feet 03/27/2018  - better. Off o2 at rest. STill coughs with water and when lies down.  Husband at bedside. Both requesting ILD clinic followup . Desaturated t 79% walking 90 feet.   OV  04/12/2018  Subjective:  Patient ID: Stacey [name], Stacey [name] , DOB: March 05, 1941 , age 58 y.o. , MRN: [id number] , ADDRESS: [address] [place] [address]   04/12/2018 -   Chief Complaint  Patient presents with   Consult    Pt is a former MW pt.  Pt  denies any current complaints of cough, SOB, or CP but states the cough she originally had ended her up in the hosp 9/11-9/17 with dx acute respiratory failure. Pt does wear 2pulse with exertion and also wears 2L continuous when at home.     HPI Stacey [name] 83 y.o. -presents for follow-up to the ILD clinic.  She is known to have rheumatoid arthritis with ILD changes.  She had been followed by Dr. Sandrea Hughs.  However in July 2019 in September 2019 she has had 2 admissions to the hospital with respiratory distress and hypoxemic respiratory failure.  In the first 1 that seem to be right lower lobe infiltrate and then she improved from it but in the second 1 even though the right lower lobe infiltrates were better she still was hypoxemic.  Acid reflux and dysphagia was considered a possible etiology but she passed swallow study 2 times.  They thought she had some reflux.  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  At the same time it is not clear if the ILD is progressive based on pulmonary function testing below   At this point in time she tells me that she is getting home physical therapy.  Her fatigue is improving but it is not fully resolved.  She was discharged on continuous oxygen which she is using.  However she is feeling less short of breath.  Today in fact when we turned her oxygen off and walked her she did not desaturate and this is a significant improvement.  She is on monthly Orencia through the [facility] rheumatologist Dr. Rexene Agent.  At this point in time she is put the Orencia on hold.  She told me that she is been on Orencia for 4 years and never had a respiratory exacerbation still recently x 2.  Although before going on Orencia she had pneumonia while on Remicade and the Remicade.  In terms of her rheumatoid arthritis she hardly has any pain.  Her joint architecture is fairly well-preserved because of various immunomodulators over  time.  She says that she was on Remicade for years and when she stopped it for 8 weeks before the switch to Orencia she never really had a relapse in her rheumatoid arthritis.  She is largely pain and stiffness free.  She believes she can go without  her Orencia for a while.  Review of the literature shows greater than 10% chance of a respiratory infection especially COPD exacerbation.  Although the time frame for this is unclear.       OV 06/07/2018  Subjective:  Patient ID: Stacey [name], Stacey [name] , DOB: 02-20-1941 , age 85 y.o. , MRN: [id number] , ADDRESS: [address] [place] [address]   06/07/2018 -   Chief Complaint  Patient presents with   Follow-up    ILD, PFT done today, some wheezing and coughing but better tha before   Rheumatoid arthritis ILD and asthma/obstructive lung disease phenotype on Dulera  HPI Stacey [name] 83 y.o. -presents for routine follow-up with her husband.  She is here to follow-up with [facility] Dr. Aundria Rud.  She plans to do  this in December 2019.  She continues to be off Orencia.  Her joints are slowly getting stiff again.  She believes that she will need to be back on immunosuppression agent again.  She currently continues Medrol 4 mg alternating with 2 mg.  This for her rheumatoid arthritis.  In terms of her joints she continues on Medrol 4 mg alternating with 2 mg but not on any other immunosuppression agent.  Overall she is been stable but for the last 2 weeks has had green sputum and wheezing and chest congestion and cough.  She recently visited her husband who was hospitalized and walking the long hallways at [facility] made a short of breath but she thinks this is probably baseline for her.  There are no other new issues.  She did have spirometry and DLCO and this shows a decline compared to September 2019 and a similar to July 2019.  It is documented below.  This is probably reflective of a flareup   OV  07/21/2018  Subjective:  Patient ID: Stacey [name], Stacey [name] , DOB: 1940-11-13 , age 76 y.o. , MRN: [id number] , ADDRESS: [address] [place] [address]   07/21/2018 -   Chief Complaint  Patient presents with   Follow-up    Pt states she has been doing well since last visit. States she is about to begin Rituxan with [facility]. Pt still becomes SOB with exertion. Denies any complaints of cough or CP.   Rheumatoid arthritis ILD and asthma/obstructive lung disease phenotype on Dulera  HPI Stacey [name] 83 y.o. -presents for follow-up of the above.  Last seen just before Thanksgiving 2019.  In the interim overall stable although on June 23, 2018 she climbs a steep flight of stairs which is unusual exertion for her and she became very dyspneic.  Following day saw Dr. Aundria Rud at [facility] rheumatology and was given Z-Pak and prednisone and started feeling better.  Although it is not fully clear to me she had fever and bronchitic symptoms.  I reviewed Dr. Aundria Rud note.  Dr. Aundria Rud is decided to start Rituxan for rheumatoid arthritis.  She is only having some minimal joint pain at this point.  She is off Orencia and continues to be off Orencia.  She did have some blood work with [place] before starting Rituxan.  She is due to see Dr. Aundria Rud within the next week and start her Rituxan.  Her liver function test July 18, 2017 is normal hemoglobin is normal.  CRP is also normal.  We did spirometry and walking desaturation test.  These show improvement compared to before and these are documented below.  Currently she not using nighttime or daytime oxygen.  She is wondering if she could switch rheumatology care to [facility].  This is because while she likes [employer] she is getting older and more frail and feels some body local would be of help.  I have sent a message to Dr. Corliss Skains inquiring.  Certainly we can help her with Rituxan infusions at [facility] health system if needed.  She  will check on this with her [facility] rheumatologist.       OV 03/27/2019  Subjective:  Patient ID: Stacey [name], Stacey [name] , DOB: July 03, 1941 , age 51 y.o. , MRN: [id number] , ADDRESS: [address] [place] [address]  Rheumatoid arthritis ILD and asthma/obstructive lung disease phenotype on [facility]   03/27/2019 -  Rourine fu   HPI Stacey [name] 83 y.o. -  presents for the above.  Last seen in January 2020.  After that she has seen Dr. Aundria Rud rheumatology at [facility].  She is now getting Rituxan 2 doses every 6 months.  She says this is helped her joints and her stiffness.  She is a little bit more mobile than usual.  However in terms of her respiratory status she continues to have episodic cough.  In June 2020 she again got hypoxemic and got admitted.  Since then she has had episodic cough.  She had a respiratory exacerbation in June 2020 for the admission she got steroids.  This seemed to help.  She is also on a higher dose Dulera right now.  In terms of her cough this seems to be her biggest problem.  She seems to be on Dulera, Singulair scheduled with also Tessalon and Delsym and DuoNeb.  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  Her last high-resolution CT scan of the chest was 1 year ago.  She says the dyspnea itself is not worse.  She is currently not using oxygen.  On exam she did have some wheezing.  Currently she has white and brown sputum but this is baseline.  In terms of a COVID wrist she has been tested recently couple of times and this is been negative.  She is isolating well.  She wanted to know COVID prevention activities and risk status and masking strategies.      OV 05/24/2019  Subjective:  Patient ID: Stacey [name], Stacey [name] , DOB: 17-Feb-1941 , age 65 y.o. , MRN: [id number] , ADDRESS: [address] [place] [address]   05/24/2019 -   Chief  Complaint  Patient presents with   Follow-up    Pt was recently in the hosp due to ILD. Pt states that she has been better since being out of the hosp.   Rheumatoid arthritis ILD and asthma/obstructive lung disease phenotype on Dulera/Singulair  Post herpetic neuralgia - on elavil, gabapentin, effexor  HPI Stacey [name] 83 y.o. -returns for follow-up.  At the last visit approximately 2 months ago she was reporting worsening cough following an admission in summer 2020.  Her pulmonary function test suggested worsening ILD status.  Therefore we requested a high-resolution CT chest which she did in October 2020.  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  However in the interim after the CT scan was done towards the end of October 2020 she developed worsening of her cough over 2 weeks and also associated shortness of breath but significantly the cough is much worse.  She ended up getting admitted to the hospital.  There was some hypoxemia.  By this time she had finished a ENT evaluation that did not show any involvement of the arytenoids.  Pulmonary was consulted.  She was given a prednisone burst which she just finished I believe yesterday.  She is back on her baseline Medrol.  And she is feeling better.  Her oxygen status is improved although she is using oxygen at night now.  She is really frustrated with these recurrent flareups and these admissions which ended up with her having a wheeze and also hypoxemia.  Currently she is on her baseline Medrol for rheumatoid arthritis associated with Dulera and Singulair.  She is also on losartan for blood pressure.  Her walking desaturation test is slightly worse  than baseline.  She has a GI consult pending because of the recurrent episodes of cough and flareups.  We went over exposure history.  We used interstitial lung disease questionnaire for the exposure history.  Specifically she denies any electronic cigarette use of  marijuana use of cocaine use or any IV drug abuse.  [place] last 14 years.  Asked extensive questions about the home environment it is positive for nebulizer use but the nebulizer does not have mildew or mold in it.  Otherwise no organic antigen exposure.  The house is not damp.  There is no mold or mildew in the shower curtain.  There is no humidifier use no steam iron use.  No Jacuzzi use.  No misting [place] outside to inside the house.  No pet birds.  No pet gerbils no feather pillows.  There is no mold in the [facility] duct.  She does not do any gardening.  Does not use wind instruments.  Also 122 question occupational history elicited and essentially negative.  The other issue is that she has polypharmacy.  She is asking for my help in reducing her medications.  She is on 3 medications for postherpetic neuralgia.  She is on losartan      OV 06/21/2019  Subjective:  Patient ID: Stacey [name], Stacey [name] , DOB: 05-18-1941 , age 75 y.o. , MRN: [id number] , ADDRESS: [address] [place] [address]   06/21/2019 -   Chief Complaint  Patient presents with   televisit    hosp 11/29-12/4 due to covid with pna. pt said that she is doing okay after recent hosp but states she has no energy.      Stacey [name] 83 y.o. - last visit 05/24/2019. Diagnosed with covid-19 on 05/31/2019. Admitted 06/11/2019  - 06/16/2019. Quarantine ends 06/21/2019 today. Rx with Remdesviri and Decadron. Husband also on phone. Questions  1. Quarantine ends - from 06/22/19\ and she is not contagious and likely resistant to reinfection to covid for another few months  2. She is on 10 day dexamethasone for covid - today is last day for it  3. She is on dulera. Hospital gave combivent and she does not want do combivent - this is fine  4. Ofev for ILD - not started it yet . She had questions about side effects. Explained GI and LFT monirtong. SHe wanted to  wait till Jan 2021 and start it . I am ok with that.   5. Small hiat   IMPRESSION: HRCT OCt 2020 1. 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 Findings have progressed since 2017 and 2019 high-resolution chest CT studies. 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 Findings are consistent with UIP per consensus guidelines: Diagnosis of Idiopathic Pulmonary Fibrosis: An Official ATS/ERS/JRS/ALAT Clinical Practice Guideline. Am Rosezetta Schlatter Crit Care Med Vol 198, Iss 5, [phone], Mar 13 2017. 2. 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 4. Small hiatal hernia.   Aortic Atherosclerosis (ICD10-I70.0).     Electronically Signed   By: Delbert Phenix M.D.   On: 04/24/2019 13:29    OV 08/03/2019 - face to face visit  Subjective:  Patient ID: Stacey [name], Stacey [name] , DOB: [birth year] , age 84 y.o. , MRN: [id number] , ADDRESS: [address] [place] [address]    Rheumatoid arthritis with Joint  s/ILD- Rituxan/steroids ([facility])  - progressive phenotype (CT 2017/2019 -> Oct 2020). Planned Ofev start in Nov 2020 but did not start as of dec 2020. LFT normal Jan 2021  Athma/obstructive lung disease phenotype on Dulera/Singulair  Post herpetic neuralgia - on elavil, gabapentin, effexor  Coronary Artery Calcification - 1 vessel - cardiology referral done  Diagnosed with covid-19 on 05/31/2019. Admitted 06/11/2019  - 06/16/2019. Rx with Remdesviri and Decadron.  Small Hiatal hernia onCT Oct 2020  Rib fracture from fall Dec 2020: Musculoskeletal: There is nondisplaced fractures of the posterior left fifth, 6, 8, 9, and tenth left ribs. There is unchanged slight superior compression deformities of the T5, T6, T10 vertebral bodies with less than 25% loss in height.Associated pulmnary contusion  +    HPI Stacey [name] 83 y.o. -presents for a visit. Her nintedanib has been delayed because of COVID-19. Also subsequently a fall. She saw a nurse practitioner on July 20, 2019 and they took a shared decision to start nintedanib. She had a ? telephone visit with Dr. Kennieth Francois the rheumatologist at [facility]. Patient scheduled for her next Rituxan in February 2021 but reviewed the notes indicates this could be pushed to early spring 2021. Dr. Aundria Rud is okay with the patient study got intubated at this point in time.  Patient has follow-up with Dr. Aundria Rud tomorrow at [facility].  At this face-to-face visit she is here with her husband.  She says she is much better after the Covid and also the fall that fractured her ribs.  Nevertheless all this is left her fatigue.  Infective fatigue scores are much worse.  Also in the last 2 weeks has had increased cough wheezing and shortness of breath.  The sputum was also changed color in the last 1 week to clear green.  This is all new.  Her symptom score is therefore a worse than her baseline.  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  She is only taking 100 mg once a day.  The plan was to go up to 100 mg twice a day next week.  This is a minimum effective dose.  The maximum dose is 150 mg twice a day.  Given her age and comorbidities we are starting at a low dose.  Part of this visit is to make sure that she is tolerating the drug fine.  And so far she is.  She is interested in the ILD-pro registry study done by the [facility] clinical research Institute.   She is working with physical therapy for her fatigue.  Ambulatory Walk 07/20/2019 2 Lap- O2 92% RA; HR 109 No shortness of breath She did not need to stop Used walker    OV 09/20/2019  Subjective:  Patient ID: Stacey [name], Stacey [name] , DOB: May 24, 1941 , age 50 y.o. , MRN: [id number] , ADDRESS: [address] [place]  [place] [address]   09/20/2019 -   Chief Complaint  Patient presents with   Televisit    Called and spoke with pt who stated she has been feeling okay since last visit. Pt stated she is still taking OFEV and denies any complaints. Pt states she believes her breathing is stable at this point.    Rheumatoid arthritis with Joint s/ILD- Rituxan/steroids (duke uinviesty)  - progressive phenotype (CT 2017/2019 -> Oct 2020). Planned Ofev start in Nov 2020 but did not start as of dec 2020.  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 Admitted 06/11/2019  - 06/16/2019. Rx with Remdesviri and Decadron.  Small Hiatal hernia on CT Oct 2020  Rib fracture from fall Dec 2020: Musculoskeletal: There is nondisplaced fractures of the posterior left fifth, 6, 8, 9, and tenth left ribs. There is unchanged slight superior compression deformities of the T5, T6, T10 vertebral bodies with less than 25% loss in height.Associated pulmnary contusion +   HPI Stacey [name] 83 y.o. - has 2nd covid vaccine later this week. Rituxan is end of the month. Baseline RA regiment is On dulera for associated obstructio of lung. Continue ofev 100mg  bid since mid jan 2021 for ILD. No side effects. Did see Buelah Manis for face to face visit early feb 2021 -> for bronchitis and felt better after prdnisone and zpak. Currently on medrol   8 mg day and staying there per Buelah Manis . Wants t oknow if she can taper. Wants to know how she can prevent care flare ups. Explained ofev, masking and social distancing  prevent respiratory infection.Denies choking on food or aspirating. Denies mold in house - relatively news. Denies dog. Denies cat. Denies carious teeth. Denies post nasal drip  OVerll feels better and improved dyspnea.     OV 12/18/2019  Subjective:  Patient ID: Stacey [name], Stacey [name] , DOB: [birth year] , age 59 y.o. , MRN: [id number] , ADDRESS: [address] [place] [address]  PCP [name], MD Rheumatologist-Dr. Rexene Agent at [facility]/ILD: Dr. Jeni Salles  12/18/2019 -   Chief Complaint  Patient presents with   Follow-up    no worse   Rheumatoid arthritis with Joint s/ILD- Rituxan/steroids (duke uinviesty)  - progressive phenotype (CT 2017/2019 -> Oct 2020). Planned Ofev start in Nov 2020 but did not start as of dec 2020. 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 Admitted 06/11/2019  - 06/16/2019. Rx with Remdesviri and Decadron.  Small Hiatal hernia on CT Oct 2020  Rib fracture from fall Dec 2020: Musculoskeletal: There is nondisplaced fractures of the posterior left fifth, 6, 8, 9, and tenth left ribs. There is unchanged slight superior compression deformities of the T5, T6, T10 vertebral bodies with less than 25% loss in height.Associated pulmnary contusion +  -On Reclast once a year through Dr. Rexene Agent   HPI Stacey [name] 83 y.o. 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  It has been a few month since I last saw her.  In this time she did not taper her Medrol down.   She stated 8 mg/day.  She was originally taking the Medrol for her rheumatoid arthritis for many years.  She recently increased the dose from 4 mg / 2 mg every other day to 8 mg daily because of recurrent respiratory exacerbations.  She is asking for [facility] refills for obstructive lung disease.  She is getting Rituxan through Dr. Rexene Agent for rheumatoid arthritis.  Her next Rituxan is in September 2021.  She saw Dr. Aundria Rud in April 2021 and reviewed the note.  Her liver function test at the time was fine.  In terms of her ILD she continues on nintedanib 100 mg twice daily.  She says she has no side effects from the drug she is tolerating it quite well.  We discussed about increasing the dose but she wants to hold off because she just got a new supply.  However she is open to increasing the dose when the supply runs out.  She is due for liver function test today.  In terms of overall symptoms things have improved as can be seen below and the symptom score.Marland Kitchen  However her main concern is that of fatigue.  She is open to attending pulmonary rehabilitation.  She is not using oxygen at home.  Sometime back when we tested overnight oxygen desaturation test she did not desaturate.  Walking desaturation test today stable.  She does notice of note that her blood pressure has gone up.  She is working with her primary care physician on this.  She is on losartan.    OV 04/10/2020   Subjective:  Patient ID: Stacey [name], Stacey [name] , DOB: 1941/04/12, age 69 y.o. years. , MRN: [id number],  ADDRESS: [address] [place] [address] PCP  [name], MD Providers : Treatment Team:  Attending Provider: Kalman Shan, MD   Chief Complaint  Patient presents with   Follow-up    CT scan 8/23, has not increased Ofev, shortness of breath with exertion.    Rheumatoid arthritis with Joint s/ILD- Rituxan/steroids (duke uinviesty)  - progressive phenotype (CT 2017/2019 -> Oct 2020). Planned Ofev  start in Nov 2020 but did not start as of dec 2020. 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 Admitted 06/11/2019  - 06/16/2019. Rx with Remdesviri and Decadron.  Admitted August 2021 for respiratory infection not otherwise specified.  Following trip to [place].  Was hypoxemic.  Small Hiatal hernia on CT Oct 2020  Rib fracture from fall Dec 2020: Musculoskeletal: There is nondisplaced fractures of the posterior left fifth, 6, 8, 9, and tenth left ribs. There is unchanged slight superior compression deformities of the T5, T6, T10 vertebral bodies with less than 25% loss in height.Associated pulmnary contusion +  -On Reclast once a year through Dr. Rexene Agent    HPI Stacey [name] 83 y.o. -returns for follow-up.  Last week by myself early June 2021.  Then after that in early August 2020 when she got admitted for respiratory infection not otherwise specified.  She was hypoxemic  on room air.  This followed a trip to [place].  In the follow-up phase end of August 2020 when she had high-resolution CT chest that showed increased groundglass opacities but when compared to a CT from 8 months ago.  Also chronic ILD had worsened.  I personally visualized the image at this point in time she is improved from this admission she is not using oxygen at all.  In fact walking desaturation test shows near baseline.  Nevertheless overall her symptom scores have declined over time ILD symptom score is listed  below.  She is really concerned about several issues.  In terms of her ILD she was inquiring about escalating to 150 mg twice daily.  Even at 100 mg twice daily she is significantly symptomatic in terms of nonrespiratory issues such as nausea and dizziness and fatigue.  I did caution her that these could all get worse.  Therefore she is just opted to be at 100 mg twice daily  -Recurrent admissions for respiratory issues [I did address that some of this was Covid and fall related and not necessarily her usual summer exacerbation].  Nevertheless, I asked about exposures at home.  She does not have a feather blanket or feather pillow or feather jacket.  She denies any mold or bird exposure or mildew exposure at home.  Does not do any gardening or wind instruments.  She is immunosuppressed and I did notice that she is not on Bactrim for PCP prophylaxis.  In 2019 when we checked G6PD the lab could not process this lab.  She is interested in Bactrim prophylaxis.  She is interested in okay getting G6PD rechecked   -She is dealing with significant amount of dizziness.  Neurology is sending her to neuro rehab.  It is believed it is multifactorial.  I reviewed her recent CT head angio report.  I personally visualized the image   -She is also worried about cough.  Currently cough is tolerable but she says prior to her exacerbations cough gets worse.  We did discuss with her that if she is aspirating which she denied.  We did discuss about the possibility of starting Bactrim and monitoring her cough and she is fine with that.  I did review old records Lungs/Pleura: Biapical pleuroparenchymal scarring. Patchy and largely peripheral peribronchovascular ground-glass, increased from 07/03/2019. Underlying subpleural reticulation, traction bronchiectasis/bronchiolectasis and scattered honeycombing, similar to minimally increased from 07/03/2019. Calcified granulomas. No pleural fluid. Airway is unremarkable. No air  trapping.   Upper Abdomen: Visualized portions of the liver, adrenal glands, kidneys, spleen, pancreas, stomach and bowel are unremarkable with the exception of a small hiatal hernia. Cholecystectomy. Calcified upper abdominal lymph nodes.   Musculoskeletal: Degenerative changes in the spine. No worrisome lytic or sclerotic lesions.   IMPRESSION: 1. Increased patchy pulmonary parenchymal ground-glass may be due to an atypical/viral pneumonia, including due to COVID-19. 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 2. Aortic atherosclerosis (ICD10-I70.0). Coronary artery calcification.     Electronically Signed   By: Leanna Battles M.D.   On: 03/04/2020 12:58    OV 08/01/2020  Subjective:  Patient ID: Stacey [name], Stacey [name] , DOB: 03/24/1941 , age 35 y.o. , MRN: [id number] , ADDRESS: [address] [place] [address] PCP [name], MD Patient Care Team: [name], MD as PCP - General (Internal Medicine) Jodelle Red, MD as PCP - Cardiology (  Cardiology)  This Provider for this visit: Treatment Team:  Attending Provider: Kalman Shan, MD   Rheumatoid arthritis with Joint s/ILD- Rituxan/steroids (duke uinviesty)  - progressive phenotype (CT 2017/2019 -> Oct 2020). Planned Ofev start in Nov 2020 but did not start as of dec 2020. 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 Admitted 06/11/2019  - 06/16/2019. Rx with Remdesviri and Decadron.  Admitted August 2021 for respiratory infection not otherwise specified.  Following trip to [place].  Was hypoxemic.  Small Hiatal hernia on CT Oct 2020  Rib fracture from fall Dec 2020: Musculoskeletal: There is nondisplaced fractures of the posterior left fifth, 6, 8, 9, and tenth left ribs. There is unchanged slight superior compression deformities of the T5, T6, T10 vertebral bodies with less than 25% loss in height.Associated pulmnary contusion +  -On Reclast once a year through Dr. Rexene Agent    08/01/2020 -   Chief Complaint  Patient presents with   Follow-up    Doing well     HPI Stacey [name] 83 y.o. -returns for follow-up.  She says since last visit she has lost weight.  In fact tracking her weight it appears she is definitely lost weight.  She has occasional intermittent nausea once every few weeks this is because she does not time her nintedanib well with food.  She has a low appetite as well.  She tried to go up on the Internet but she is unable to.  She takes a low-dose 100 mg twice daily.  She is on Rituxan and prednisone.  She is up-to-date with her COVID-vaccine.  Current shortness of breath standpoint she is stable.  Symptoms are stable.  CT Chest data  OV 10/22/2020  Subjective:  Patient ID: Stacey [name], Stacey [name] , DOB: 1941/06/20 , age 30 y.o. , MRN: [id number] , ADDRESS: [address] [place] [address] PCP [name], MD Patient Care Team: [name], MD as PCP - General (Internal Medicine) Jodelle Red, MD as PCP - Cardiology (Cardiology)  This Provider for this visit: Treatment Team:  Attending Provider: Kalman Shan, MD    10/22/2020 -   Chief Complaint  Patient presents with   Follow-up    Still having shortness of breath with activity, denies  increase in severity.     Rheumatoid arthritis with Joint s/ILD- Rituxan/steroids (duke uinviesty)  - progressive phenotype (CT 2017/2019 -> Oct 2020). Planned Ofev start in Nov 2020 but did not start as of dec 2020. 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 Admitted 06/11/2019  - 06/16/2019. Rx with Remdesviri and Decadron.  - sp evushed x 2  Admitted August 2021 for respiratory infection not otherwise specified.  Following trip to [place].  Was hypoxemic.  Small Hiatal hernia on CT Oct 2020  Rib fracture from fall Dec 2020: Musculoskeletal: There is nondisplaced fractures of the posterior left fifth, 6, 8, 9, and tenth left ribs. There is unchanged slight superior compression deformities of the T5, T6, T10 vertebral bodies with less than 25% loss in height.Associated pulmnary contusion +  -On Reclast once a year through Dr. Rexene Agent  Normal ECHO Summer 2020   HPI Stacey [name] 83 y.o. -returns for follow-up.  At this point in time for her ILD she is taking nintedanib 100 mg twice daily.  Last liver function test was in December 2021.  She is now attending pulmonary rehabilitation.  She is not sure it is helping her.  She tells me that she does not desaturate at pulmonary rehabilitation so she not using her oxygen.  Review of the records indicate same but it appears that today her dyspnea is worse in terms of symptom score although she is denying that.  Walking desaturation  test in the office today with a forehead probe suggest that pulse ox is declined.  Her pulmonary function test also shows 5% FVC decline.  She was surprised by this.  In talking to her realize that for coexistent obstructive lung disease she is no longer taking Dulera.  She is willing to take inhaler which she will have to twist instead of having to pump with her fingers because she has rheumatoid arthritis.  She is no longer losing weight though.  She had questions about taking monoclonal antibody prophylaxis.  She is under the impression she needs to take it every 6 months with Rituxan dosing.  I did explain to her that in the presence of Rituxan the body is not able to respond actively to vaccine and that is why she is having prophylactic antibody.  She is now had 2 doses the last 1 being a few weeks ago first 1 was in January 2022.  I have written to the [facility] coordinator Lillard Anes 27 monoclonal antibody prophylaxis schedule.  Expressed to patient that because of monoclonal antibody prophylaxis she did not do the vaccine.  Last echocardiogram was in summer 2020.      OV 12/23/2020  Subjective:  Patient ID: Stacey [name], Stacey [name] , DOB: 12-21-1940 , age 59 y.o. , MRN: [id number] , ADDRESS: [address] [place] [address] PCP [name], MD Patient Care Team: [name], MD as PCP - General (Internal Medicine) Jodelle Red, MD as PCP - Cardiology (Cardiology)  This Provider for this visit: Treatment Team:  Attending Provider: Kalman Shan, MD   12/23/2020 -   Chief Complaint  Patient presents with   Follow-up    PFT performed today. Pt states she has been doing okay since last visit and denies any complaints.     HPI DELAINEE TRAMEL 83 y.o. -returns for follow-up.  There is concern for progression.  Last visit we have made sure she added Breo.  She says she feels better.  In fact symptom score is better.  However pulmonary function test shows  continued decline.  She is on the low-dose nintedanib.  She says if she does not take the nintedanib exactly with food and  even if she takes it 15 to 20 minutes later she will have nausea.  Today she had nausea but otherwise if she is diligent she can maintain.  I informed her the pulmonary function test shows continued decline.  She is on Rituxan prednisone and low-dose nintedanib.  She initially said she will not be able to tolerate the higher dose nintedanib but after realizing that her nausea is mild and her weight loss is stable she says she is willing to give the higher dose nintedanib and attend.  I was able to get her some donor sample of nintedanib.  The lot number of this is [id number].  Expiration date is November 2023.  The serial number is [id number].  The GT IN number is [id number]       ECHO 11/3120  IMPRESSIONS     1. Left ventricular ejection fraction, by estimation, is 65 to 70%. The  left ventricle has normal function. The left ventricle has no regional  wall motion abnormalities. Left ventricular diastolic parameters are  consistent with Grade I diastolic  dysfunction (impaired relaxation). Elevated left ventricular end-diastolic  pressure. The E/e' is 43. The average left ventricular global longitudinal  strain is -18.4 %.   2. Right ventricular systolic function is normal. The right ventricular  size is normal. There is mildly elevated pulmonary artery systolic  pressure. The estimated right ventricular systolic pressure is 36.9 mmHg.   3. The mitral valve is abnormal. Mild mitral valve regurgitation.   4. The aortic valve is tricuspid. Aortic valve regurgitation is not  visualized. Mild aortic valve sclerosis is present, with no evidence of  aortic valve stenosis.   5. The inferior vena cava is normal in size with greater than 50%  respiratory variability, suggesting right atrial pressure of 3 mmHg.   Comparison(s): Changes from prior study are noted. 12/18/18  EF 60-65%. PA  pressure .    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  OV 03/10/2021  Subjective:  Patient ID: Stacey [name], Stacey [name] , DOB: 1940/12/19 , age 34 y.o. , MRN: [id number] , ADDRESS: [address] [place] [address] PCP [name], MD Patient Care Team: [name], MD as PCP - General (Internal Medicine) Jodelle Red, MD as PCP - Cardiology (Cardiology)  This Provider for this visit: Treatment Team:  Attending Provider: Kalman Shan, MD    03/10/2021 -   Chief Complaint  Patient presents with   Follow-up    Pt states she has questions about ofev she currently on it but having side effects.    Rheumatoid arthritis with Joint s/ILD- Rituxan/steroids (duke uinviesty)  - progressive phenotype (CT 2017/2019 -> Oct 2020). Planned Ofev start in Nov 2020 but did not start as of dec 2020. 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 Admitted 06/11/2019  - 06/16/2019. Rx with Remdesviri and Decadron.  - sp evushed x 2  - 2nd covid by hx June 2022 - evusheld repeat Oct 2022  Admitted August  2021 for respiratory infection not otherwise specified.  Following trip to [place].  Was hypoxemic.  Small Hiatal hernia on CT Oct 2020  Rib fracture from fall Dec 2020: Musculoskeletal: There is nondisplaced fractures of the posterior left fifth, 6, 8, 9, and tenth left ribs. There is unchanged slight superior compression deformities of the T5, T6, T10 vertebral bodies with less than 25% loss in height.Associated pulmnary contusion +  -On Reclast once a year through Dr. 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  -returns for follow-up.  She presents with her husband Park Breed.  Last visit she wanted to increase the nintedanib to 150 mg twice daily but this is made her have more nausea and vomiting.  The diarrhea is very mild.  She then reduced her nintedanib 200 mg twice daily but still having side effects.  She is now started losing weight again.  She is really frustrated by this.  Her husband asked about pirfenidone.  Explained that the research with progressive pulmonary fibrosis is limited in the case of pirfenidone so nintedanib is the first choice.  Did agree that if nintedanib did not work we would try pirfenidone.  She wanted know what her options were.  We discussed about stopping nintedanib and improving quality of life but face the risk of continued progression pulmonary fibrosis.  She was not that enthused about this choice.  We discussed about trying Zofran with the nintedanib.  She seemed more receptive to this idea.  We decided that we will try with 100 mg twice daily at the lower dose and then see.  She believes asthma is under control.  Her pulmonary function test shown below seems to fluctuate.  Overall symptom severity stable.  She wanted a handicap placard signed today which I did.       OV 04/08/2021  Subjective:  Patient ID: Stacey [name], Stacey [name] , DOB: 29-Jun-1941 , age 35 y.o. , MRN: [id number] ,  ADDRESS: [address] [place] [address] PCP [name], MD Patient Care Team: [name], MD as PCP - General (Internal Medicine) Jodelle Red, MD as PCP - Cardiology (Cardiology)  This Provider for this visit: Treatment Team:  Attending Provider: Kalman Shan, MD    04/08/2021 -   Chief Complaint  Patient presents with   Follow-up    Pt states she has been doing okay since last visit and denies any real complaints. States breathing has been doing okay and states the nausea went away.    Rheumatoid arthritis with Joint s/ILD- Rituxan/steroids ([facility])  - progressive phenotype (CT 2017/2019 -> Oct 2020). Planned Ofev start in Nov 2020 but did not start as of dec 2020. LFT normal Jan 2021   - Rituxan via [facility] -September 2021 + Medrol 4mg /2mg  QOD baseline many decades (on 8mg  per day since feb 2021 due to recurrent asthma flares)  - nintedanib for her ILD she started it on July 31, 2019 - started 100mg  twice daily (started low dose du eto side effect concern) -> increased to 150mg  bid d June 2022 -> reduced to 100mg  bid aug 2022 due tow  eight loss  - ILD PRO registry study - since 08/16/19  Athma/obstructive lung disease phenotype on Dulera/Singulair  Post herpetic neuralgia - on elavil, gabapentin, effexor  Coronary Artery Calcification - 1 vessel -  S/p - covid-19 on 05/31/2019. Admitted 06/11/2019  - 06/16/2019. Rx with Remdesviri and Decadron.  - sp evushed x 2  Admitted August 2021 for respiratory infection not otherwise specified.  Following trip to [place].  Was hypoxemic.  Small Hiatal hernia on CT Oct 2020  Rib fracture from fall Dec 2020: Musculoskeletal: There is nondisplaced fractures of the posterior left fifth, 6, 8, 9, and tenth left ribs. There is unchanged slight superior compression deformities of the T5, T6, T10 vertebral bodies with less than 25% loss in height.Associated pulmnary contusion +  -On Reclast  once a year through Dr. Rexene Agent  Normal ECHO Summer 2020 -> possible [facility] May 2021 RSVP 30s with GR1 ddx  HPI JAYLON GRODE 84 y.o. 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  Last seen 4 weeks ago.  At that time she was losing weight and having significant GI side effects with nintedanib full dose.  Told her to reduce the nintedanib dose to 100 mg twice daily and also take Zofran as needed.  She is taking Zofran as needed.  She is tolerating the nintedanib low-dose much better.  Symptom scores are listed below.  Overall she feels better.  Weight loss is stabilized.  Infectious gained 1 pound of weight.  She feels that she will just take with nintedanib 100 mg twice daily using Zofran as needed.  She does not want to change the schedule.  I fully agree with her and supported on that.  She is aware that the low-dose is less effective but this might be best balance between risk and benefit.  She did ask about getting a COVID by Valent mRNA booster 2 oh micron and BA.5.  According to history she has had COVID x2.  Most recently was in June 2022 which would be against the current viral strain.  In addition she is got monoclonal antibody prophylaxis euvsheld in past.  She does socially isolate herself to the extent possible and masks.  She is going to get the monoclonal antibody prophylaxis again in a few weeks when she gets her Rituxan.  We did discuss the fact that because she is on Rituxan she is immunosuppressed and does not make antibodies and then the facet antibody transfer that she gets through [facility] is risk protective.  Additional risk protective feature is the fact that she had a current strain of COVID-19 and potentially has natural immunity.  Therefore it is her call to get the bivaet booster although she wanted to avoid it and I supported this in a shared decision making she could hold off till at least January 2023 when it  will be 6 months since her natural infection.     OV 07/18/2021  Subjective:  Patient ID: Stacey [name], Stacey [name] , DOB: 10-Mar-1941 , age 34 y.o. , MRN: [id number] , ADDRESS: [address] [place] [address] PCP [name], MD Patient Care Team: [name], MD as PCP - General (Internal Medicine) Jodelle Red, MD as PCP - Cardiology (Cardiology)  This Provider for this visit: Treatment Team:  Attending Provider: Kalman Shan, MD    07/18/2021 -   Chief Complaint  Patient presents with   Follow-up    Pt states she has been doing okay  since last visit. States her breathing is about the same.    Rheumatoid arthritis with Joint s/ILD- Rituxan/steroids ([facility])  - progressive phenotype (CT 2017/2019 -> Oct 2020). Planned Ofev start in Nov 2020 but did not start as of dec 2020. 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 Admitted 06/11/2019  - 06/16/2019. Rx with Remdesviri and Decadron.  - sp evushed x 2  Admitted August 2021 for respiratory infection not otherwise specified.  Following trip to [place].  Was hypoxemic.  Small Hiatal hernia on CT Oct 2020  Rib fracture from fall Dec 2020: Musculoskeletal: There is nondisplaced fractures of the posterior left fifth, 6, 8, 9, and tenth left ribs. There is unchanged slight superior compression deformities of the  T5, T6, T10 vertebral bodies with less than 25% loss in height.Associated pulmnary contusion +  -On Reclast once a year through Dr. Rexene Agent  Normal ECHO Summer 2020 -> possible [facility] May 2021 RSVP 30s with GR1 ddx   xxxx HPI SHAILYN WEYANDT 83 y.o. -presents for follow-up.  Last visit September 2022.  Since then she is doing stable.  With a lower dose of nintedanib no more diarrhea.  Weight loss is stopped.  Barely any nausea.  She is not needing Zofran for nausea control.  Her main concern right now is that she is having imbalance issues.  She is stopped working with the physical therapy.  She thinks it is 1 of proprioception but also long-term muscle loss and sarcopenia.  I have advised her to take this with her primary care/rheumatology.  Terms of asthma is stable on Breo and Singulair.  Last liver function test April 2022 in our chart.  Latest pulmonary function test shows stability    CT Chest data  No results found.            OV 03/06/2022  Subjective:  Patient ID: Stacey [name], Stacey [name] , DOB: 1940/07/26 , age 27 y.o. , MRN: [id number] , ADDRESS: [address] [place] [address] PCP [name], MD Patient Care Team: [name], MD as PCP - General (Internal Medicine) Jodelle Red, MD as PCP - Cardiology (Cardiology)  This Provider for this visit: Treatment Team:  Attending Provider: Kalman Shan, MD    03/06/2022 -   Chief Complaint  Patient presents with   Follow-up    PFT performed today.  Pt states she has been doing okay since last visit. States her breathing has been doing okay but states she has been having some problems with balance.    HPI Stacey [name] 83 y.o. -returns for follow-up.  Last seen in January 2023.  She says that from a breathing standpoint she is stable but she has had balance issues.  She has had 2 accidental falls.  The first 1 was in March 2023 while entering the house along the  steps and she  landed in the bushes and had 25 stitches to her left lower extremity.  She also had collarbone fracture.  Then again a fall in the driveway 2 months ago but with just bruising.  She is now going to undergo physical therapy.  ENT evaluation did not show any ENT causes for the fall.  In terms of her dyspnea and walking desaturation test in the office she is stable but her pulmonary function test shows continued decline.  She is on nintedanib at the lower dose because she is not able to tolerate the higher dose.  On this lower dose she still requiring Zofran for control of nausea.  She takes Zofran preemptively.  There are no other issues.  The last echo and CT scan was over a year ago.     OV 12/11/2022  Subjective:  Patient ID: Stacey [name], Stacey [name] , DOB: [birth year] , age 56 y.o. , MRN: [id number] , ADDRESS: [place] [address] [place] [address] PCP Eloisa Northern, MD Patient Care Team: Eloisa Northern, MD as PCP - General (Internal Medicine) Jodelle Red, MD as PCP - Cardiology (Cardiology)  This Provider for this visit: Treatment Team:  Attending Provider: Kalman Shan, MD    12/11/2022 -   Chief Complaint  Patient presents with   Follow-up    F/up, in the hosp. 3 weeks ago, and wants to know why she was in the hosp.     HPI Stacey [name] 83 y.o. -was last seen in August 2023.  After that her husband has passed away 2022/10/08 following motor vehicle accident when he was crossing the road.  .  Then she was admitted for 3 days 11/17/2022 through 11/20/2022 with nausea and vomiting.  She was also diagnosed with pneumonia.  The hospitalist contacted me.  Out of caution advised him to hold off giving any nintedanib she did receive IV community-acquired pneumonia therapy.  Review of the labs indicate she did not suffer from acute kidney injury.  Her discharge sodium was slightly low at 134.  Blood cultures negative.She had CT abdomen and pelvis 11/11/2022 that  did not show any acute process in the abdomen. She was discharged to SNF.  She tells me that at baseline she was taking Zofran once daily with some mild nintedanib induced nausea but the onset of nausea for this admission was quite abrupt and severe.  She does not know the cause.  After discharge from the SNF 2 weeks ago and now for the last 10 days she is back taking nintedanib.  She is having some mild drug-induced nausea and she says she is able to combat this with taking Zofran twice daily.  At this point in time she prefers to take Zofran along with the nintedanib and see what happens.   For a respiratory standpoint she is frustrated she not been able to get a pulmonary function test since August 2023.  Her ill health and also PFT machine not working have delayed this.  Subjectively she is feeling the same and clinically she feels stable from an ILD standpoint.  Therefore I suspect it is stable.   OV 03/02/2023  Subjective:  Patient ID: Stacey [name], Stacey [name] , DOB: 09/09/1940 , age 25 y.o. , MRN: [id number] , ADDRESS: [address] [place] [address] PCP [name], MD Patient Care Team: [name], MD as PCP - General (Internal Medicine) Jodelle Red, MD as PCP - Cardiology (Cardiology)  This Provider  for this visit: Treatment Team:  Attending Provider: Kalman Shan, MD  2    03/02/2023 -   Chief Complaint  Patient presents with   Follow-up    F/up on PFT     HPI SHWETA AMAN 83 y.o. -returns for follow-up.  She at this point is tolerating her nintedanib at 100 mg twice daily but she is increased her Zofran.  This visit is to see her pulmonary function test.  In May 2024 she did have CT abdomen the lung images and that she did show progressive ILD compared to 2020 07/2020.  Her pulmonary function test during this time had declined but today the FVC is improved while the DLCO is worse.  I think overall this will be stable compared to a year ago  but directionally she does have progressive phenotype.  Her last liver function test was in May 2024.  She will have repeat liver function test today.  She also will participate in the ILD-Pro research registry protocol.  I did discuss with her about inhaled pirfenidone versus placebo in a clinical trial development protocol.  She is interested in this and I gave her a copy of the consent.     OV 08/26/2023  Subjective:  Patient ID: [place] [name], Stacey [name] , DOB: 07/04/1941 , age 78 y.o. , MRN: [id number] , ADDRESS: [place] [address] [place] [address] PCP Eloisa Northern, MD Patient Care Team: Eloisa Northern, MD as PCP - General (Internal Medicine) Jodelle Red, MD as PCP - Cardiology (Cardiology)  This Provider for this visit: Treatment Team:  Attending Provider: Kalman Shan, MD    Rheumatoid arthritis with Joint s/ILD- Rituxan/steroids [facility])  - progressive phenotype (CT 2017/2019 -> Oct 2020). Planned Ofev start in Nov 2020 but did not start as of dec 2020. 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 Admitted 06/11/2019  - 06/16/2019. Rx with Remdesviri and Decadron.  - sp evushed x 2 Status once influenza hospitalization for mild respiratory failure February 2025.  Admitted August 2021 for respiratory infection not otherwise specified.  Following trip to  [place].  Was hypoxemic.  Small Hiatal hernia on CT Oct 2020  Rib fracture from fall Dec 2020: Musculoskeletal: There is nondisplaced fractures of the posterior left fifth, 6, 8, 9, and tenth left ribs. There is unchanged slight superior compression deformities of the T5, T6, T10 vertebral bodies with less than 25% loss in height.Associated pulmnary contusion +  -On Reclast once a year through Dr. Rexene Agent  Normal ECHO Summer 2020 -> possible [facility] May 2021 RSVP 30s with GR1 ddx  -  Normal ECHO may 202    Nintedanib/Ofev requires intensive drug monitoring due to high concerns for Adverse effects of , including  Drug Induced Liver Injury, significant GI side effects that include but not limited to Diarrhea, Nausea, Vomiting,  and other system side effects that include Fatigue,  weight loss. Cardiac side effects are a black box warning as well. These will be monitored with  blood work such as LFT initially once a month for 6 months and then quarterly   08/26/2023 -   Chief Complaint  Patient presents with   Follow-up    ILD F/U      HPI DONNI OGLESBY 83 y.o. -returns for follow-up.  She is now relocated to a house near the office.  This is because she became a widow.  However shortly after moving to the house she picked up the influenza.  This was early February 2025 this year this month.  She was hospitalized in mild respiratory failure and 1 day of acute kidney injury and some dehydration.  Since then she has been admitted to skilled nursing facility rehabilitation.  She needs 1 more week of this before discharge.  However she continues nintedanib for her ILD and it is stable.  She also maintains a every 50-month Rituxan.  The last dose was in August 2024.  The next dose is due 08/30/2023 at [facility].  She says now being a widow and living alone and also being deconditioned after influenza she is wondering if she can get her Rituxan locally.  I did indicate to her that our  office has an adjacent infusion center run by [facility].  She was delighted to hear this because there is only 1 mile from her house.  Therefore I have corresponded with Dr. Kennieth Francois at [facility] and also pharmacy team to see if Rituxan is still indicated and if so the dosage.  Waiting to hear on that.  Given all this she wants to hold off on in participating on any recent study.  On exam she did have crackles and squeaks but she does not feel she is in an exacerbation.  She is due for an ILD-pro registry visit today.   Last CT scan of the chest was in 2021 Last pulmonary function test was in August 2024   Of note she tells me that for the last few months she has had pedal edema and this been no workup.  Hospital lab review does not show she has had a BNP or echo.  Last echo was in 2022.   For this visit external records from the hospital were reviewed.  Also communication was established with other providers.  She is on    OV 09/09/2023  Subjective:  Patient ID: [place] [name], Stacey [name] , DOB: 23-Jan-1941 , age 61 y.o. , MRN: [id number] , ADDRESS: [place] [address] [place] [address] PCP Eloisa Northern, MD Patient Care Team: Eloisa Northern, MD as PCP - General (Internal Medicine) Jodelle Red, MD as PCP - Cardiology (Cardiology)  This Provider for this visit: Treatment Team:  Attending Provider: Kalman Shan, MD    09/09/2023 -     HPI Stacey [name] 83 y.o. -acute visit posthospitalization.  She just saw me 08/26/2023.  Then 4 days later on 08/30/2023 she ended up in the ER.  Review of the records show that she had vomiting the previous day and then the next morning she was no acute hypoxic respiratory failure.  She got treated with antibiotics and steroids.  She did have an echocardiogram that did not show pulmonary hypertension.  She also had a BNP that was normal.  She had significant pedal edema.  Her UA was abnormal.  A the cellulitis of urinary tract infection was because of her vomiting but she is on nintedanib.  She was treated with antibiotics.  She is now even more physically deconditioned but managing.  She is off oxygen at rest.  She did get some steroids.  She is discharged and she is presenting with one of her friends and a caretaker.   SYMPTOM SCALE - ILD 03/27/2019  05/24/2019 (2-3 weeks    .mrmonpre cpovid)  08/03/2019 Post covid Post fall rib # 12/18/2019  04/10/2020 142# resp virus admit aug 2021 nos 08/01/2020 134#  10/22/2020 136# Now doing rehab 12/23/2020 136# 03/10/2021 131# Ofev 150mg  bid 04/08/2021 132# Ofev 100mg  bid 07/18/2021 133# ofev 100 x 2 03/06/2022 132#, ofev 100x2 12/11/2022 128# 03/02/2023  09/09/2023 POST ADMIT  O2 use ra       ra ra ra ra ra ra ra ra  Shortness of Breath 0 -> 5 scale with 5 being worst (score 6 If unable to do)                At rest 0 2 3 2 3 0 3 0 3 1  2 1  4 3   Simple tasks - showers, clothes change, eating, shaving 0 3 2 3 4 1 3 2 3 1  3 3  3 3   Household (dishes, doing bed, laundry) 2 4 5 3 4 3 3 3 4 3 3 4  4 5   Shopping 2 3 6 4 4 4 4 3 4 4 4 4  4 5   Walking level at own pace 2 3 4 4 3 2 4 2 3 3 3 4  4 5   Walking up Stairs 3 3 5 4 4 4 4 3 4 4 4 5  4 5   Total (40 - 48) Dyspnea Score 9 17 25 20 22 14 21 13 21 16 19 21  23 26   How bad is your cough? 2 2 3 1 2  0 0 0 1 0 0 0  3 1  How bad is your fatigue 3 3.5 5 3.5 3 4 3 2 3 3 4 4  3 5   nause    0 1 1 0 1 2.5 1 1 1   0 0  vomit    0 1 1 0 1 2.4 1 1 1   0 0  diarrhea    0 1 0 0 0 1 0 0 0  0 3  anxiety    0 2 0 1 0 1 0 0 1  0 0  depression    0 1 0 0 0 0 0 0 0  0 0     Simple office walk  feet x  3 laps goal with forehead probe 04/12/2018  07/21/2018  05/24/2019  12/18/2019  04/10/2020  08/01/2020  10/22/2020  03/10/2021  07/18/2021  03/06/2022  12/11/2022  08/26/2023   O2 used Room air - off o2 x 10 min Room air  Room air ra ra ra ra ra ra ra ra ra  Number laps completed 3 x 185 feet 3 x 250 feet    r     3 1 lap Sit and satnd - di dnot need walker  Comments about pace normal normal   Slow, no walker Slow . No walker.  Slow. No walker. No cane walker   waler   Resting Pulse Ox/HR 99% and 72/min 97% and 74/min 97% and 81/min 97% and 76/m 96% and 88/min 94% and 83 96% and HR  79 97% and HR 51 100% and 78 99% and HR 73 97% and HR 76 98% and HR 78  Final Pulse Ox/HR 98% and 95/min 96% and 96/min 94% and 107/min 93% and 94 92% and 97 90% and 100 89% and 104/min 92% and 106 97% and 102 92% and HR 105 94% ad HR 80 93% and HR 90  Desaturated </= 88% no no no no no yes yes no no no    Desaturated <= 3% points no no Yes, 3 pints Ues, 4 points Yes,  4 points Yes, 4 points Yes, 7 points  Yes, 3 pints Yes, 7 poins  Yes 5 points  Got Tachycardic >/= 90/min yes yes yes yes yes yes yes   yes    Symptoms at end of test No complaint Mild dyspnea Mod dyspnea Some dyspnea dyspnea dyspnie cat 3d Dyspneic moderate  Dyspneic and wheezing Mod dyspnea Mopd dyspea    Miscellaneous comments improed from hospital Same v improved woprse same  Wobbly gait baseline Needed 2 break  Stopped x 2 to get balanced and staggered while alking        PFT     Latest Ref Rng & Units 03/01/2023   11:45 AM 03/06/2022    3:01 PM 07/02/2021    3:07 PM 03/07/2021   11:05 AM 12/23/2020    1:03 PM 10/22/2020    9:05 AM 09/10/2020    9:52 AM  PFT Results  FVC-Pre L 1.81  1.62  1.69  1.78  1.66  1.73  1.76   FVC-Predicted Pre % 69  61  62  66  60  63  64   Pre FEV1/FVC % % 76  74  73  72  71  76  75   FEV1-Pre L 1.39  1.20  1.23  1.28  1.17  1.30  1.32   FEV1-Predicted Pre % 71  61  61  64  57  64  64   DLCO uncorrected ml/min/mmHg 11.53  12.57  15.35  12.94  14.98  13.67  15.38   DLCO UNC% % 59  65  79  66  77  70  79   DLCO corrected ml/min/mmHg 11.53  12.57  15.35  12.94  14.98  13.80  15.38   DLCO COR %Predicted % 59  65  79  66  77  71  79   DLVA Predicted % 78  84  100  84  86  80  102        LAB RESULTS last 96 hours No results  found.       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   reports that she has never smoked. She has never used smokeless tobacco.  Past Surgical History:  Procedure Laterality Date   CHOLECYSTECTOMY  2011   IR ANGIO INTRA EXTRACRAN SEL COM CAROTID INNOMINATE BILAT MOD SED  06/18/2020   IR ANGIO VERTEBRAL SEL SUBCLAVIAN INNOMINATE UNI R MOD SED  06/18/2020   IR ANGIO VERTEBRAL SEL VERTEBRAL UNI L MOD SED  06/18/2020   WISDOM TOOTH EXTRACTION      Allergies  Allergen Reactions   Penicillins Other (See Comments)    Unknown reaction  Did it involve swelling of the face/tongue/throat, SOB, or low BP?  Unknown Did it involve sudden or severe rash/hives, skin peeling, or any reaction on the inside of your mouth or nose? Unknown Did you need to seek medical attention at a hospital or doctor's office? No When did it last happen?     childhood  If all above answers are "NO", may proceed with cephalosporin use.     Remicade [Infliximab] Other (See Comments)    Unknown reaction    Immunization History  Administered Date(s) Administered   Fluad Quad(high Dose 65+) 04/23/2020, 03/23/2022   Influenza Split 04/12/2013, 04/12/2014, 03/14/2015, 04/23/2016   Influenza, High Dose Seasonal PF 04/12/2017, 03/24/2018, 04/13/2019, 03/27/2021   Influenza-Unspecified 04/03/2013, 04/22/2017   Moderna Sars-Covid-2 Vaccination 08/25/2019, 09/22/2019, 03/07/2020   Pfizer Covid-19 Vaccine Bivalent Booster 79yrs & up 05/16/2021   Pneumococcal Conjugate-13 04/03/2013, 09/28/2014   Pneumococcal Polysaccharide-23 07/13/2012, 07/24/2013   Respiratory Syncytial Virus Vaccine,Recomb  Aduvanted(Arexvy) 04/12/2022   Td 01/11/2018   Tdap 12/10/2017    Family History  Problem Relation Age of Onset   Asthma Mother    Anemia Mother    Polymyalgia rheumatica Mother    COPD Father    Pulmonary fibrosis Father    Breast cancer Maternal Grandmother 42     Current Outpatient Medications:    acetaminophen (TYLENOL) 500 MG tablet, Take 1,000 mg by mouth every 6 (six) hours as needed for moderate pain (pain score 4-6)., Disp: , Rfl:    amitriptyline (ELAVIL) 25 MG tablet, Take 25 mg by mouth at bedtime., Disp: , Rfl: 0   Calcium Carbonate-Vitamin D (CALCIUM-VITAMIN D PO), Take 1 tablet by mouth in the morning and at bedtime. 500-25 mg/mcg, Disp: , Rfl:    cholecalciferol (VITAMIN D3) 25 MCG (1000 UNIT) tablet, Take 1,000 Units by mouth every evening., Disp: , Rfl:    furosemide (LASIX) 40 MG tablet, Take 1 tablet (40 mg total) by mouth daily., Disp: , Rfl:    gabapentin (NEURONTIN) 300 MG capsule, Take 600 mg by mouth at bedtime., Disp: , Rfl:    ketorolac (TORADOL) 10 MG tablet, Take 1 tablet (10 mg total) by mouth every 6 (six) hours as needed., Disp: , Rfl:    lidocaine 4 %, Place 1 patch onto the skin daily. Apply to coccyx, Disp: , Rfl:    losartan (COZAAR) 25 MG tablet, Take 25 mg by mouth daily., Disp: , Rfl:    melatonin 5 MG TABS, Take 1 tablet (5 mg total) by mouth at bedtime., Disp: , Rfl:    Nintedanib (OFEV) 100 MG CAPS, Take 1 capsule (100 mg total) by mouth 2 (two) times daily., Disp: 60 capsule, Rfl: 10   Nutritional Supplements (ENSURE ORIGINAL) LIQD, Take 1 Bottle by mouth in the morning and at bedtime., Disp: , Rfl:    ondansetron (ZOFRAN) 4 MG tablet, TAKE ONE TABLET BY MOUTH EVERY 8 HOURS AS NEEDED FOR NAUSEA AND VOMITING (Patient taking differently: Take 4 mg by mouth every 8 (eight) hours as needed for nausea or vomiting.), Disp: 30 tablet, Rfl: 5   pantoprazole (PROTONIX) 40 MG tablet, Take 40 mg by mouth daily., Disp: , Rfl:    polyethylene glycol  (MIRALAX / GLYCOLAX) 17 g packet, Take 17 g by mouth daily as needed for mild constipation., Disp: 14 each, Rfl: 0   potassium chloride SA (KLOR-CON M) 20 MEQ tablet, Take 1 tablet (20 mEq total) by mouth daily., Disp: , Rfl:    pravastatin (PRAVACHOL) 20 MG tablet, Take 1 tablet (20 mg total) by mouth daily., Disp: 90  tablet, Rfl: 1   [START ON 09/10/2023] predniSONE (DELTASONE) 10 MG tablet, Take 1 tablet (10 mg total) by mouth daily with breakfast for 3 days., Disp: , Rfl:    predniSONE (DELTASONE) 20 MG tablet, Take 1 tablet (20 mg total) by mouth daily with breakfast for 3 days., Disp: , Rfl:    venlafaxine XR (EFFEXOR-XR) 75 MG 24 hr capsule, Take 75 mg by mouth daily., Disp: , Rfl:       Objective:   Vitals:   09/09/23 1639  BP: (!) 147/67  Pulse: 72  Temp: 98.2 F (36.8 C)  TempSrc: Oral  SpO2: 99%  Weight: 124 lb (56.2 kg)  Height: 5\' 6"  (1.676 m)    Estimated body mass index is 20.01 kg/m as calculated from the following:   Height as of this encounter: 5\' 6"  (1.676 m).   Weight as of this encounter: 124 lb (56.2 kg).  @WEIGHTCHANGE @  [employer]   09/09/23 1639  Weight: 124 lb (56.2 kg)     Physical Exam   General: No distress.  Frail Stacey [name] looking pleasant. O2 at rest: no Cane present: no Sitting in wheel chair: Has a walker Frail: yes Obese: no Neuro: Alert and Oriented x 3. GCS 15. Speech normal Psych: Pleasant Resp:  Barrel Chest - no.  Wheeze -previous wheeze in the exam is resolved but she has lower lobe crackles with wheeze which is baseline for her., Crackles -yes, No overt respiratory distress CVS: Normal heart sounds. 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 PEERL +. No post nasal drip        Assessment:       ICD-10-CM   1. Hospital discharge follow-up  Z09     2. Acute respiratory failure with hypoxia (HCC)  J96.01          Plan:     Patient Instructions     Interstitial lung  disease due to connective tissue disease (HCC) Encounter for long-term current use of high risk medication  -Seems you are tolerating Ofev rechallenge now with taking Zofran twice daily on a scheduled basis but unclear why you have vomioting 08/30/23  - doubt ofev but cannot rule out - PFT slowly worse over time but unclera if worse x 1 year   Plan - STOP OFEV x 2 weeks  -then restart ofev 100mg  once daily x 1 week and then twice daily - can take zofran 4mg  twice daily  - -Continue  steroids  prednisome 3mg  per day through the rheumatologist - NO Rituxan for another month atleast  - sending message to [facility] to see if you still need rituxan and if you do if we can do it at our office location - continuie  breo 100 dosing daily once with albuterol as needed - Hold off AVALYN or TETON 305 study evaluation for another 1-3 months du to recent flu  Asthma, unspecified asthma severity, unspecified whether complicated, unspecified whether persistent  - stable  Plan  -continue breo scheduled with albuteraol as neede - if worse call for higher dose prednisoe  PEDAL EDEMA x few months ECHO - no pulmonary hypertension Feb 2025 BNP - normal FEb 2025  Plan - Per PCP Eloisa Northern, MD   Followup  1 month Future Appointments  Date Time Provider Department Center  09/24/2023 10:00 AM [facility]/VAS [facility]  10/07/2023  4:30 PM Kalman Shan, MD [facility] None  11/09/2023 10:00 AM GI-315 CT 2 GI-315CT GI-315  W. [facility]  11/12/2023  8:30 AM [facility] PFT ROOM [facility] None  11/12/2023  9:15 AM Kalman Shan, MD [facility] None      FOLLOWUP Return for keep the 10/07/23 office visit.    SIGNATURE    Dr. Kalman Shan, M.D., F.C.C.P,  Pulmonary and Critical Care Medicine Staff Physician, [facility] Center Director - Interstitial Lung Disease  Program  Pulmonary Fibrosis [facility] at [facility] [place], [place], [address]  Pager: [phone], If no answer or between  15:00h - 7:00h: call [phone] Telephone: [phone]  5:08 PM 09/09/2023

## 2023-09-13 ENCOUNTER — Other Ambulatory Visit: Payer: Self-pay | Admitting: *Deleted

## 2023-09-13 DIAGNOSIS — F329 Major depressive disorder, single episode, unspecified: Secondary | ICD-10-CM | POA: Diagnosis not present

## 2023-09-13 DIAGNOSIS — E43 Unspecified severe protein-calorie malnutrition: Secondary | ICD-10-CM | POA: Diagnosis not present

## 2023-09-13 DIAGNOSIS — S2241XD Multiple fractures of ribs, right side, subsequent encounter for fracture with routine healing: Secondary | ICD-10-CM | POA: Diagnosis not present

## 2023-09-13 DIAGNOSIS — E785 Hyperlipidemia, unspecified: Secondary | ICD-10-CM | POA: Diagnosis not present

## 2023-09-13 NOTE — Patient Outreach (Signed)
 Post- Acute Care Manager follow up. Mrs. Blasdell resides in [facility].  Screening for potential complex care management services as benefit of health plan and primary care provider. Mrs. Chambers utilized [facility] for admission to [facility] skilled.   Collaborative meeting with Deseree, [employer] social worker. Mrs. Valvo plans to return to ILF at [facility] likely this week.   Will follow for transition date.   Raiford Noble, MSN, RN, BSN [place]  [facility], [facility] RN Post- Acute Care Manager Direct Dial: [phone]

## 2023-09-20 ENCOUNTER — Other Ambulatory Visit: Payer: Self-pay | Admitting: *Deleted

## 2023-09-20 NOTE — Patient Outreach (Signed)
 Post-Acute Care Manager follow up.   Secure message sent to [facility] social worker to inquire about transition date.   Stacey [name] utilized [facility] waiver for skilled stay at [facility].   Raiford Noble, MSN, RN, BSN [place]  [facility], [facility] RN Post- Acute Care Manager Direct Dial: [phone]

## 2023-09-21 ENCOUNTER — Other Ambulatory Visit: Payer: Self-pay | Admitting: *Deleted

## 2023-09-21 DIAGNOSIS — F329 Major depressive disorder, single episode, unspecified: Secondary | ICD-10-CM | POA: Diagnosis not present

## 2023-09-21 DIAGNOSIS — J9611 Chronic respiratory failure with hypoxia: Secondary | ICD-10-CM | POA: Diagnosis not present

## 2023-09-21 DIAGNOSIS — Z7952 Long term (current) use of systemic steroids: Secondary | ICD-10-CM | POA: Diagnosis not present

## 2023-09-21 DIAGNOSIS — R296 Repeated falls: Secondary | ICD-10-CM | POA: Diagnosis not present

## 2023-09-21 DIAGNOSIS — I10 Essential (primary) hypertension: Secondary | ICD-10-CM | POA: Diagnosis not present

## 2023-09-21 DIAGNOSIS — J849 Interstitial pulmonary disease, unspecified: Secondary | ICD-10-CM | POA: Diagnosis not present

## 2023-09-21 DIAGNOSIS — E785 Hyperlipidemia, unspecified: Secondary | ICD-10-CM | POA: Diagnosis not present

## 2023-09-21 DIAGNOSIS — Z556 Problems related to health literacy: Secondary | ICD-10-CM | POA: Diagnosis not present

## 2023-09-21 DIAGNOSIS — J440 Chronic obstructive pulmonary disease with acute lower respiratory infection: Secondary | ICD-10-CM | POA: Diagnosis not present

## 2023-09-21 DIAGNOSIS — K219 Gastro-esophageal reflux disease without esophagitis: Secondary | ICD-10-CM | POA: Diagnosis not present

## 2023-09-21 DIAGNOSIS — M0510 Rheumatoid lung disease with rheumatoid arthritis of unspecified site: Secondary | ICD-10-CM | POA: Diagnosis not present

## 2023-09-21 DIAGNOSIS — J189 Pneumonia, unspecified organism: Secondary | ICD-10-CM | POA: Diagnosis not present

## 2023-09-21 DIAGNOSIS — M800AXD Age-related osteoporosis with current pathological fracture, other site, subsequent encounter for fracture with routine healing: Secondary | ICD-10-CM | POA: Diagnosis not present

## 2023-09-21 NOTE — Patient Outreach (Signed)
 Post-Acute Care Manager follow up. Mrs. Duet utilized [facility] SN[facility] waiver for admission to [facility].   Spoke with Kinnie Feil, SNF social worker. Mrs. Butrum returned to [facility] on yesterday 09/20/23.     Raiford Noble, MSN, RN, BSN [place]  [facility], [facility] RN Post- Acute Care Manager Direct Dial: [phone]

## 2023-09-22 NOTE — Telephone Encounter (Signed)
 Per OV note on 09/09/2023 from Dr. Marchelle Gearing - NO Rituxan for another month atleast             - sending message to [facility] to see if you still need rituxan and if you do if we can do it at our office location

## 2023-09-24 ENCOUNTER — Other Ambulatory Visit (HOSPITAL_BASED_OUTPATIENT_CLINIC_OR_DEPARTMENT_OTHER): Payer: Medicare Other

## 2023-09-24 DIAGNOSIS — J440 Chronic obstructive pulmonary disease with acute lower respiratory infection: Secondary | ICD-10-CM | POA: Diagnosis not present

## 2023-09-24 DIAGNOSIS — J9611 Chronic respiratory failure with hypoxia: Secondary | ICD-10-CM | POA: Diagnosis not present

## 2023-09-24 DIAGNOSIS — I10 Essential (primary) hypertension: Secondary | ICD-10-CM | POA: Diagnosis not present

## 2023-09-24 DIAGNOSIS — J189 Pneumonia, unspecified organism: Secondary | ICD-10-CM | POA: Diagnosis not present

## 2023-09-24 DIAGNOSIS — M0510 Rheumatoid lung disease with rheumatoid arthritis of unspecified site: Secondary | ICD-10-CM | POA: Diagnosis not present

## 2023-09-24 DIAGNOSIS — J849 Interstitial pulmonary disease, unspecified: Secondary | ICD-10-CM | POA: Diagnosis not present

## 2023-09-27 ENCOUNTER — Other Ambulatory Visit (HOSPITAL_BASED_OUTPATIENT_CLINIC_OR_DEPARTMENT_OTHER): Payer: Self-pay | Admitting: Cardiology

## 2023-09-27 ENCOUNTER — Telehealth: Payer: Self-pay | Admitting: Internal Medicine

## 2023-09-27 DIAGNOSIS — J189 Pneumonia, unspecified organism: Secondary | ICD-10-CM | POA: Diagnosis not present

## 2023-09-27 DIAGNOSIS — J9611 Chronic respiratory failure with hypoxia: Secondary | ICD-10-CM | POA: Diagnosis not present

## 2023-09-27 DIAGNOSIS — J440 Chronic obstructive pulmonary disease with acute lower respiratory infection: Secondary | ICD-10-CM | POA: Diagnosis not present

## 2023-09-27 DIAGNOSIS — R634 Abnormal weight loss: Secondary | ICD-10-CM | POA: Diagnosis not present

## 2023-09-27 DIAGNOSIS — M810 Age-related osteoporosis without current pathological fracture: Secondary | ICD-10-CM | POA: Diagnosis not present

## 2023-09-27 DIAGNOSIS — J849 Interstitial pulmonary disease, unspecified: Secondary | ICD-10-CM | POA: Diagnosis not present

## 2023-09-27 DIAGNOSIS — R627 Adult failure to thrive: Secondary | ICD-10-CM | POA: Diagnosis not present

## 2023-09-27 DIAGNOSIS — M0510 Rheumatoid lung disease with rheumatoid arthritis of unspecified site: Secondary | ICD-10-CM | POA: Diagnosis not present

## 2023-09-27 DIAGNOSIS — R296 Repeated falls: Secondary | ICD-10-CM | POA: Diagnosis not present

## 2023-09-27 DIAGNOSIS — E7800 Pure hypercholesterolemia, unspecified: Secondary | ICD-10-CM

## 2023-09-27 DIAGNOSIS — I10 Essential (primary) hypertension: Secondary | ICD-10-CM | POA: Diagnosis not present

## 2023-09-27 DIAGNOSIS — M069 Rheumatoid arthritis, unspecified: Secondary | ICD-10-CM | POA: Diagnosis not present

## 2023-09-27 DIAGNOSIS — K5900 Constipation, unspecified: Secondary | ICD-10-CM | POA: Diagnosis not present

## 2023-09-27 DIAGNOSIS — J449 Chronic obstructive pulmonary disease, unspecified: Secondary | ICD-10-CM | POA: Diagnosis not present

## 2023-09-27 DIAGNOSIS — N179 Acute kidney failure, unspecified: Secondary | ICD-10-CM | POA: Diagnosis not present

## 2023-09-27 DIAGNOSIS — E785 Hyperlipidemia, unspecified: Secondary | ICD-10-CM | POA: Diagnosis not present

## 2023-09-27 NOTE — Telephone Encounter (Signed)
 Patient's daughter is calling(not on DPR).During appointment Ramswamy told patient to hold off on Rituxan and that she could possible get them done  locally instead of in [place].  Patient can be reached at [phone] or [phone].

## 2023-09-28 NOTE — Telephone Encounter (Signed)
 I called and spoke with the pt She has questions about meds and requested appt with MR  I scheduled her for a virtual visit on Friday 10/01/23  Nothing further needed

## 2023-09-30 DIAGNOSIS — I10 Essential (primary) hypertension: Secondary | ICD-10-CM | POA: Diagnosis not present

## 2023-09-30 DIAGNOSIS — M0510 Rheumatoid lung disease with rheumatoid arthritis of unspecified site: Secondary | ICD-10-CM | POA: Diagnosis not present

## 2023-09-30 DIAGNOSIS — J9611 Chronic respiratory failure with hypoxia: Secondary | ICD-10-CM | POA: Diagnosis not present

## 2023-09-30 DIAGNOSIS — J440 Chronic obstructive pulmonary disease with acute lower respiratory infection: Secondary | ICD-10-CM | POA: Diagnosis not present

## 2023-09-30 DIAGNOSIS — J189 Pneumonia, unspecified organism: Secondary | ICD-10-CM | POA: Diagnosis not present

## 2023-09-30 DIAGNOSIS — J849 Interstitial pulmonary disease, unspecified: Secondary | ICD-10-CM | POA: Diagnosis not present

## 2023-10-01 ENCOUNTER — Telehealth: Admitting: Internal Medicine

## 2023-10-01 ENCOUNTER — Telehealth: Payer: Self-pay | Admitting: Internal Medicine

## 2023-10-01 DIAGNOSIS — Z09 Encounter for follow-up examination after completed treatment for conditions other than malignant neoplasm: Secondary | ICD-10-CM

## 2023-10-01 NOTE — Progress Notes (Signed)
 Brief patient profile:  45 yowf  never smoker with allergies/inhalers as child outgrew by [employer] then  RA since around 2000  Prednisone  x decades and prev eval by Dr Delford Field around 2004 for sob resolved s maint rx and referred 05/26/2013 by Dr Renne Crigler for bronchitis and abn cxr   History of Present Illness  05/26/2013 1st [facility] office visit/ Wert cc June 2014 dx pna  In [place] and remicade stopped and 100% better and placed arencia in September 2014  then abruptly worse first week in November with cough green sputum s nasal symptoms, fever low grade and no cp or cough and completely recovered prior to OV does not recall abx but issue is why keeps getting sick and abn CT Chest (see below).   Arthritis symptoms well controlled at present on Rx for RA rec Nexium 40 mg Take 30-60 min before first meal of the day and add pepcid 20 mg one at bedtime whenever coughing.     10/19/2017  f/u ov/Wert re:  RA lung dz  Chief Complaint  Patient presents with   Follow-up    Cough is much improved, but has not resolved yet. Cough is non prod. She has not had to use her neb.   Dyspnea:  Not limited by breathing from desired activities  But some doe x steps Cough: daytime > noct dry  Sleep: fine  SABA use:  No saba Medrol 4 mg a/w 2mg  per day/ ok control of arthritis  rec Start back on gabapentin up to 300 mg each am  in addition to the the two at bedtime  If not better increase the medrol to 8 mg daily until bettter then taper back to where you      01/10/2018  f/u ov/Wert re:  RA  Lung dz Medrol  4 mg  One alternating with a half Chief Complaint  Patient presents with   Follow-up    PFT's done. Her breathing has been gradually worsening since the last visit. She has occ cough- non prod.   Dyspnea gradually worse since last ov:  MMRC1 =  MMRC3 = can't walk 100 yards even at a slow pace at a flat grade s stopping due to sob    Gradually x 3 m / more fatigue / no change in  arthritis  Cough: not an issue rec Protonix 40 mg Take 30-60 min before first meal of the day  GERD diet   01/17/2018 acute extended ov/Wert re: cough on medrol 4 mg  One a/w one half  Chief Complaint  Patient presents with   Acute Visit    started coughing 01/11/18- occ prod with minimal green sputum.  She states also wheezing and having increased SOB.    abruptly worse 01/11/18 with severe 24/7 coughing >>  prod min green mucus esp in am/ assoc with subjective wheeze and did not follow previous contingencies re flutter / saba/ increase medrol and admits she does not rember those written instructions nor how to use the neb provided .  No fever/ comfortable at rest sitting  rec For cough > mucinex dm 1200 mg every 12 hours and cough into the flutter valve as much as possible  Doxycycline 100 mg twice daily x 10 days with glass of water Medrol 4mg  x 2 now and take 2  daily until cough is better then 1 daily x 5 days and then resume the previous dose  Shortness of  breath/ wheezing/ still coughing > albuterol neb every 4 hours as needed      01/20/2018 acute extended ov/Wert re: refractory cough and sob 01/11/18 Chief Complaint  Patient presents with   Acute Visit    she is not feeling better, coughing , very SOB, wheezing  mucus now clear/scant  on doxy/ neb machine not working (tube would not plug into the side s adequate force and she was not capable of applying it due to RA hands. Cough/ wheeze/ sob 24/7 / flutter not helping/ can't lie down at hs   rec While coughing protonix 40 mg Take 30- 60 min before your first and last meals of the day  Shortness of breath/ wheezing/ still coughing > albuterol neb every 4 hours as needed  Depomedrol 120 mg IM and medrol 32 mg daily x 2 days,  then 16 mg x 3 days,  Then 8 mg x 4 days , then resume the 4 mg daily  For severe cough > tylenol 3# one every 4 hours if needed  Go to ER if condition worsens on above plan       Date of admission: 01/22/2018              Date of discharge: 01/27/2018   History of present illness: As per the H and P dictated on admission, " Olivya Sobol  is a 83 y.o. female, w Rheumatoid arthritis, ILD Asthma, apparently c/o increase in dyspnea this evening. Dry cough.   Denies fever, chills, cp, palp,  N/v, diarrhea, brbpr, black stool.   Pt notes recently being given steroid injection in office as well as being placed on doxycycline. This might have helped slightly but pt worse  Hospital Course:  Summary of her active problems in the hospital is as following. 1 dyspnea/hypoxemia/ILD Concerned that likely GERD Is causing ILD. Patient with cough.   Patient with complaints of awakening with cough and also with oral intake which is slightly improved since 01/24/2018. assessed by speech therapy and speech therapy raising concern of esophageal component but no signs of aspiration.   2D echo with a EF of 55 to 60% with no wall motion abnormalities, grade 1 diastolic dysfunction.  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 Pulmonary felt that the patient should be on scheduled Reglan. I have placed the patient on scheduled potassium before sleep. Continue steroids on discharge continue Mucinex and Claritin as well as inhalers. Patient will follow-up with pulmonary outpatient   2.  Gastroesophageal reflux disease Continue PPI and H2 blocker.  I changed PPI to [facility].   3.  Rheumatoid arthritis Outpatient follow-up.     4.  Anxiety Continue Effexor.       All other chronic medical condition were stable during the hospitalization.  Patient was ambulatory without any assistance. On the day of the discharge the patient's vitals were stable , and no other acute medical condition were reported by patient. the patient was felt safe to be discharge at home with family.   Consultants: PCCM  Procedures: Echocardiogram       03/21/2018  f/u ov/Wert re:   S/p admit was transiently  better  and downhill since Labor day on medrol 4 mg daily  Last orencia on Sept 4th 2019  Chief Complaint  Patient presents with   Acute Visit    Per patient, she has had a dry cough since July 2019. She has been wheezing as well. Increased fatigue. Body aches. Denies any fever  or chills.   Dyspnea:  MMRC4  = sob if tries to leave home or while getting dressed   Cough: harsh/ hacking mostly dry/ has flutter not using    SABA use: not much better with rx   No obvious day to day or daytime variability or assoc excess/ purulent sputum or mucus plugs or hemoptysis or cp or chest tightness, subjective wheeze or overt sinus or hb symptoms.     Also denies any obvious fluctuation of symptoms with weather or environmental changes or other aggravating or alleviating factors except as outlined above   No unusual exposure hx or h/o childhood pna/ asthma or knowledge of premature birth.   INpatient consult 03/26/18 83 year old with rheumatoid arthoritis.At baseline the patient lives at home with her husband and is independent of ADLs.  Has been on many immune suppressants over decades and curently on orencia x 4 year and prednisone. Does not recollect being on bactrim/dapsone. Chart mentions BOOP/MAI in 2001 but she denies this. 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 Got admitted. Rx with steroids. Per Notes - clnical suspicion of  arytenoid inflmmation related wheeze noticed (she also reports asthma NOS). Follolwup with ENT recommended (but not seen one as yet). She also appears to have passed swallow with rec for regular diet with thin liquids but did to have mild eso stricture and reflux during testing . PFT shows 10% FVC decline for first ime. CT chest at this tme (aug 2019) showed new rLL infiltrate.  ECHO July 2019 without evicence of elevated  PASP and saw cards [facility] June 2019 and was considered to have worsenin dyspnea due to [facility] issues (reports stress test at [facility] that was normal but I cannot see it)   She reports after discharge she got better but in last several weeks has deteriorated with cough and dyspnea. There is new hypoxemia (currently RA with nail polish and poor circulation  - 89% pulse ox) needing 2L [place]. Per [facility] improved with steroids and abx. CTA 03/24/2018 => shows that RLL inifltrate has improved . Other chronic ILD changes + and small  Hiatal hernia + witthout change.   Review of lab work does show eosinophilia at time of admision   EVENTS 03/21/18 - IgE -5, blood allegy panel - negative, 03/23/2018 - - admit . HIGH EOS 2300, ESR 48, BNP 89 , HIV neg 9/12- PCT negative, RVP negative 9/14 -  PCT < 0.1, Urine strep - negative, MRSA PCR - positive. IgE - normal 4, Blood allergy panel repeat - negative 03/26/2018 - leading consideration for airway (BO in RA +/- asthma) related flare either due to MRSA bronchitis or clinical suspicion of arytenoid inflmmation +/- GERD relatd flare (she has small hiatal hernia)  +/- ? Dysphagia  up causing mild hypoxemia acute resp failure, wheeze . Allergy and IgE blood work negative thought. Patient reports being better but says she is choking on drinkin water [facility] MD says wheeze improved significantly with steroids.  RN says was down to RA yesterday evening but needed 1L [place] at sleep. Today -Room air at rest 94% and desaturated to 86% walking 60 feet 03/27/2018  - better. Off o2 at rest. STill coughs with water and when lies down.  Husband at bedside. Both requesting ILD clinic followup . Desaturated t 79% walking 90  feet.   OV 04/12/2018  Subjective:  Patient ID: Nyra Market, female , DOB: February 22, 1941 , age 83 y.o. , MRN: [id number] , ADDRESS: [address] [place] [address]   04/12/2018 -   Chief Complaint  Patient presents with   Consult    Pt is a former MW pt.  Pt  denies any current complaints of cough, SOB, or CP but states the cough she originally had ended her up in the hosp 9/11-9/17 with dx acute respiratory failure. Pt does wear 2pulse with exertion and also wears 2L continuous when at home.     HPI JAIDYN USERY 83 y.o. -presents for follow-up to the ILD clinic.  She is known to have rheumatoid arthritis with ILD changes.  She had been followed by Dr. Sandrea Hughs.  However in July 2019 in September 2019 she has had 2 admissions to the hospital with respiratory distress and hypoxemic respiratory failure.  In the first 1 that seem to be right lower lobe infiltrate and then she improved from it but in the second 1 even though the right lower lobe infiltrates were better she still was hypoxemic.  Acid reflux and dysphagia was considered a possible etiology but she passed swallow study 2 times.  They thought she had some reflux.  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  At the same time it is not clear if the ILD is progressive based on pulmonary function testing below   At this point in time she tells me that she is getting home physical therapy.  Her fatigue is improving but it is not fully resolved.  She was discharged on continuous oxygen which she is using.  However she is feeling less short of breath.  Today in fact when we turned her oxygen off and walked her she did not desaturate and this is a significant improvement.  She is on monthly Orencia through the [facility] rheumatologist Dr. Rexene Agent.  At this point in time she is put the Orencia on hold.  She told me that she is been on Orencia for 4 years and never had a respiratory exacerbation still recently x 2.  Although before going on Orencia she had pneumonia while on Remicade and the Remicade.  In terms of her rheumatoid arthritis she hardly has any pain.  Her joint architecture is fairly well-preserved because of various immunomodulators over  time.  She says that she was on Remicade for years and when she stopped it for 8 weeks before the switch to Orencia she never really had a relapse in her rheumatoid arthritis.  She is largely pain and stiffness free.  She believes she can go without  her Orencia for a while.  Review of the literature shows greater than 10% chance of a respiratory infection especially COPD exacerbation.  Although the time frame for this is unclear.       OV 06/07/2018  Subjective:  Patient ID: Nyra Market, female , DOB: Nov 23, 1940 , age 1 y.o. , MRN: [id number] , ADDRESS: [address] [place] [address]   06/07/2018 -   Chief Complaint  Patient presents with   Follow-up    ILD, PFT done today, some wheezing and coughing but better tha before   Rheumatoid arthritis ILD and asthma/obstructive lung disease phenotype on Dulera  HPI KIRSTINE JACQUIN 83 y.o. -presents for routine follow-up with her husband.  She is here to follow-up with [facility] Dr. Aundria Rud.  She plans to do this in December 2019.  She continues to be off Orencia.  Her joints are slowly getting stiff again.  She believes that she will need to be back on immunosuppression agent again.  She currently continues Medrol 4 mg alternating with 2 mg.  This for her rheumatoid arthritis.  In terms of her joints she continues on Medrol 4 mg alternating with 2 mg but not on any other immunosuppression agent.  Overall she is been stable but for the last 2 weeks has had green sputum and wheezing and chest congestion and cough.  She recently visited her husband who was hospitalized and walking the long hallways at [facility] made a short of breath but she thinks this is probably baseline for her.  There are no other new issues.  She did have spirometry and DLCO and this shows a decline compared to September 2019 and a similar to July 2019.  It is documented below.  This is probably reflective of a flareup   OV  07/21/2018  Subjective:  Patient ID: Nyra Market, female , DOB: 1940/10/04 , age 51 y.o. , MRN: [id number] , ADDRESS: [address] [place] [address]   07/21/2018 -   Chief Complaint  Patient presents with   Follow-up    Pt states she has been doing well since last visit. States she is about to begin Rituxan with [facility]. Pt still becomes SOB with exertion. Denies any complaints of cough or CP.   Rheumatoid arthritis ILD and asthma/obstructive lung disease phenotype on Dulera  HPI NOLENE ROCKS 83 y.o. -presents for follow-up of the above.  Last seen just before Thanksgiving 2019.  In the interim overall stable although on June 23, 2018 she climbs a steep flight of stairs which is unusual exertion for her and she became very dyspneic.  Following day saw Dr. Aundria Rud at [facility] rheumatology and was given Z-Pak and prednisone and started feeling better.  Although it is not fully clear to me she had fever and bronchitic symptoms.  I reviewed Dr. Aundria Rud note.  Dr. Aundria Rud is decided to start Rituxan for rheumatoid arthritis.  She is only having some minimal joint pain at this point.  She is off Orencia and continues to be off Orencia.  She did have some blood work with [place] before starting Rituxan.  She is due to see Dr. Aundria Rud within the next week and start her Rituxan.  Her liver function test July 18, 2017 is normal hemoglobin is normal.  CRP is also normal.  We did spirometry and walking desaturation test.  These show improvement compared to before and these are documented below.  Currently she not using nighttime or daytime oxygen.  She is wondering if she could switch rheumatology care to [facility].  This is because while she likes [employer] she is getting older and more frail and feels some body local would be of help.  I have sent a message to Dr. Corliss Skains inquiring.  Certainly we can help her with Rituxan infusions at [facility] health system if needed.  She  will check on this with her [facility] rheumatologist.       OV 03/27/2019  Subjective:  Patient ID: Nyra Market, female , DOB: November 03, 1940 , age 54 y.o. , MRN: [id number] , ADDRESS: [address] [place] [address]  Rheumatoid arthritis ILD and asthma/obstructive lung disease phenotype on [facility]   03/27/2019 -  Rourine fu   HPI Claris Che  LENAH MESSENGER 83 y.o. -presents for the above.  Last seen in January 2020.  After that she has seen Dr. Aundria Rud rheumatology at [facility].  She is now getting Rituxan 2 doses every 6 months.  She says this is helped her joints and her stiffness.  She is a little bit more mobile than usual.  However in terms of her respiratory status she continues to have episodic cough.  In June 2020 she again got hypoxemic and got admitted.  Since then she has had episodic cough.  She had a respiratory exacerbation in June 2020 for the admission she got steroids.  This seemed to help.  She is also on a higher dose Dulera right now.  In terms of her cough this seems to be her biggest problem.  She seems to be on Dulera, Singulair scheduled with also Tessalon and Delsym and DuoNeb.  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  Her last high-resolution CT scan of the chest was 1 year ago.  She says the dyspnea itself is not worse.  She is currently not using oxygen.  On exam she did have some wheezing.  Currently she has white and brown sputum but this is baseline.  In terms of a COVID wrist she has been tested recently couple of times and this is been negative.  She is isolating well.  She wanted to know COVID prevention activities and risk status and masking strategies.      OV 05/24/2019  Subjective:  Patient ID: Nyra Market, female , DOB: 06/03/1941 , age 41 y.o. , MRN: [id number] , ADDRESS: [address] [place] [address]   05/24/2019 -   Chief  Complaint  Patient presents with   Follow-up    Pt was recently in the hosp due to ILD. Pt states that she has been better since being out of the hosp.   Rheumatoid arthritis ILD and asthma/obstructive lung disease phenotype on Dulera/Singulair  Post herpetic neuralgia - on elavil, gabapentin, effexor  HPI SHELONDA SAXE 83 y.o. -returns for follow-up.  At the last visit approximately 2 months ago she was reporting worsening cough following an admission in summer 2020.  Her pulmonary function test suggested worsening ILD status.  Therefore we requested a high-resolution CT chest which she did in October 2020.  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  However in the interim after the CT scan was done towards the end of October 2020 she developed worsening of her cough over 2 weeks and also associated shortness of breath but significantly the cough is much worse.  She ended up getting admitted to the hospital.  There was some hypoxemia.  By this time she had finished a ENT evaluation that did not show any involvement of the arytenoids.  Pulmonary was consulted.  She was given a prednisone burst which she just finished I believe yesterday.  She is back on her baseline Medrol.  And she is feeling better.  Her oxygen status is improved although she is using oxygen at night now.  She is really frustrated with these recurrent flareups and these admissions which ended up with her having a wheeze and also hypoxemia.  Currently she is on her baseline Medrol for rheumatoid arthritis associated with Dulera and Singulair.  She is also on losartan for blood pressure.  Her walking desaturation  test is slightly worse than baseline.  She has a GI consult pending because of the recurrent episodes of cough and flareups.  We went over exposure history.  We used interstitial lung disease questionnaire for the exposure history.  Specifically she denies any electronic cigarette use of  marijuana use of cocaine use or any IV drug abuse.  [place] the last 14 years.  Asked extensive questions about the home environment it is positive for nebulizer use but the nebulizer does not have mildew or mold in it.  Otherwise no organic antigen exposure.  The house is not damp.  There is no mold or mildew in the shower curtain.  There is no humidifier use no steam iron use.  No Jacuzzi use.  No misting [place] outside to inside the house.  No pet birds.  No pet gerbils no feather pillows.  There is no mold in the [facility] duct.  She does not do any gardening.  Does not use wind instruments.  Also 122 question occupational history elicited and essentially negative.  The other issue is that she has polypharmacy.  She is asking for my help in reducing her medications.  She is on 3 medications for postherpetic neuralgia.  She is on losartan      OV 06/21/2019  Subjective:  Patient ID: Nyra Market, female , DOB: Jul 27, 1940 , age 33 y.o. , MRN: [id number] , ADDRESS: [address] [place] [address]   06/21/2019 -   Chief Complaint  Patient presents with   televisit    hosp 11/29-12/4 due to covid with pna. pt said that she is doing okay after recent hosp but states she has no energy.      KEVIANA GUIDA 83 y.o. - last visit 05/24/2019. Diagnosed with covid-19 on 05/31/2019. Admitted 06/11/2019  - 06/16/2019. Quarantine ends 06/21/2019 today. Rx with Remdesviri and Decadron. Husband also on phone. Questions  1. Quarantine ends - from 06/22/19\ and she is not contagious and likely resistant to reinfection to covid for another few months  2. She is on 10 day dexamethasone for covid - today is last day for it  3. She is on dulera. Hospital gave combivent and she does not want do combivent - this is fine  4. Ofev for ILD - not started it yet . She had questions about side effects. Explained GI and LFT monirtong. SHe wanted to  wait till Jan 2021 and start it . I am ok with that.   5. Small hiat   IMPRESSION: HRCT OCt 2020 1. 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 Findings have progressed since 2017 and 2019 high-resolution chest CT studies. 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 Findings are consistent with UIP per consensus guidelines: Diagnosis of Idiopathic Pulmonary Fibrosis: An Official ATS/ERS/JRS/ALAT Clinical Practice Guideline. Am Rosezetta Schlatter Crit Care Med Vol 198, Iss 5, [phone], Mar 13 2017. 2. 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 4. Small hiatal hernia.   Aortic Atherosclerosis (ICD10-I70.0).     Electronically Signed   By: Delbert Phenix M.D.   On: 04/24/2019 13:29    OV 08/03/2019 - face to face visit  Subjective:  Patient ID: Nyra Market, female , DOB: [birth year] , age 71 y.o. , MRN: [id number] , ADDRESS: [address] [place] [address]  Rheumatoid arthritis with Joint s/ILD- Rituxan/steroids ([facility])  - progressive phenotype (CT 2017/2019 -> Oct 2020). Planned Ofev start in Nov 2020 but did not start as of dec 2020. LFT normal Jan 2021  Athma/obstructive lung disease phenotype on Dulera/Singulair  Post herpetic neuralgia - on elavil, gabapentin, effexor  Coronary Artery Calcification - 1 vessel - cardiology referral done  Diagnosed with covid-19 on 05/31/2019. Admitted 06/11/2019  - 06/16/2019. Rx with Remdesviri and Decadron.  Small Hiatal hernia onCT Oct 2020  Rib fracture from fall Dec 2020: Musculoskeletal: There is nondisplaced fractures of the posterior left fifth, 6, 8, 9, and tenth left ribs. There is unchanged slight superior compression deformities of the T5, T6, T10 vertebral bodies with less than 25% loss in height.Associated pulmnary contusion  +    HPI JENAI SCALETTA 83 y.o. -presents for a visit. Her nintedanib has been delayed because of COVID-19. Also subsequently a fall. She saw a nurse practitioner on July 20, 2019 and they took a shared decision to start nintedanib. She had a ? telephone visit with Dr. Kennieth Francois the rheumatologist at [facility]. Patient scheduled for her next Rituxan in February 2021 but reviewed the notes indicates this could be pushed to early spring 2021. Dr. Aundria Rud is okay with the patient study got intubated at this point in time.  Patient has follow-up with Dr. Aundria Rud tomorrow at [facility] [facility].  At this face-to-face visit she is here with her husband.  She says she is much better after the Covid and also the fall that fractured her ribs.  Nevertheless all this is left her fatigue.  Infective fatigue scores are much worse.  Also in the last 2 weeks has had increased cough wheezing and shortness of breath.  The sputum was also changed color in the last 1 week to clear green.  This is all new.  Her symptom score is therefore a worse than her baseline.  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  She is only taking 100 mg once a day.  The plan was to go up to 100 mg twice a day next week.  This is a minimum effective dose.  The maximum dose is 150 mg twice a day.  Given her age and comorbidities we are starting at a low dose.  Part of this visit is to make sure that she is tolerating the drug fine.  And so far she is.  She is interested in the ILD-pro registry study done by the [facility] clinical research [facility].   She is working with physical therapy for her fatigue.  Ambulatory Walk 07/20/2019 2 Lap- O2 92% RA; HR 109 No shortness of breath She did not need to stop Used walker    OV 09/20/2019  Subjective:  Patient ID: Nyra Market, female , DOB: 1940/08/03 , age 31 y.o. , MRN: [id number] , ADDRESS: [address] [place]  [place] [address]   09/20/2019 -   Chief Complaint  Patient presents with   Televisit    Called and spoke with pt who stated she has been feeling okay since last visit. Pt stated she is still taking OFEV and denies any complaints. Pt states she believes her breathing is stable at this point.    Rheumatoid arthritis with Joint s/ILD- Rituxan/steroids (duke uinviesty)  - progressive phenotype (CT 2017/2019 -> Oct 2020). Planned Ofev start in Nov 2020 but did not start  as of dec 2020. 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 Admitted 06/11/2019  - 06/16/2019. Rx with Remdesviri and Decadron.  Small Hiatal hernia on CT Oct 2020  Rib fracture from fall Dec 2020: Musculoskeletal: There is nondisplaced fractures of the posterior left fifth, 6, 8, 9, and tenth left ribs. There is unchanged slight superior compression deformities of the T5, T6, T10 vertebral bodies with less than 25% loss in height.Associated pulmnary contusion +   HPI NORETA KUE 83 y.o. - has 2nd covid vaccine later this week. Rituxan is end of the month. Baseline RA regiment is On dulera for associated obstructio of lung. Continue ofev 100mg  bid since mid jan 2021 for ILD. No side effects. Did see Buelah Manis for face to face visit early feb 2021 -> for bronchitis and felt better after prdnisone and zpak. Currently on medrol   8 mg day and staying there per Buelah Manis . Wants t oknow if she can taper. Wants to know how she can prevent care flare ups. Explained ofev, masking and social distancing  prevent respiratory infection.Denies choking on food or aspirating. Denies mold in house - relatively news. Denies dog. Denies cat. Denies carious teeth. Denies post nasal drip  OVerll feels better and improved dyspnea.     OV 12/18/2019  Subjective:  Patient ID: Nyra Market, female , DOB: April 09, 1941 , age 33 y.o. , MRN: [id number] , ADDRESS: [address] [place] [address]  PCP [name], MD Rheumatologist-Dr. Rexene Agent at [facility] Pulmonary/ILD: Dr. Jeni Salles  12/18/2019 -   Chief Complaint  Patient presents with   Follow-up    no worse   Rheumatoid arthritis with Joint s/ILD- Rituxan/steroids (duke uinviesty)  - progressive phenotype (CT 2017/2019 -> Oct 2020). Planned Ofev start in Nov 2020 but did not start as of dec 2020. 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 Admitted 06/11/2019  - 06/16/2019. Rx with Remdesviri and Decadron.  Small Hiatal hernia on CT Oct 2020  Rib fracture from fall Dec 2020: Musculoskeletal: There is nondisplaced fractures of the posterior left fifth, 6, 8, 9, and tenth left ribs. There is unchanged slight superior compression deformities of the T5, T6, T10 vertebral bodies with less than 25% loss in height.Associated pulmnary contusion +  -On Reclast once a year through Dr. Rexene Agent   HPI Nyra Market 83 y.o. 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  It has been a few month since I last saw her.  In this time she did not taper her Medrol down.  She stated 8 mg/day.  She was originally taking the Medrol for her rheumatoid arthritis for many years.  She recently increased the dose from 4 mg / 2 mg every other day to 8 mg daily because of recurrent respiratory exacerbations.  She is asking for [facility] refills for obstructive lung disease.  She is getting Rituxan through Dr. Rexene Agent for rheumatoid arthritis.  Her next Rituxan is in September 2021.  She saw Dr. Aundria Rud in April 2021 and reviewed the note.  Her liver function test at the time was fine.  In terms of her ILD she continues on nintedanib 100 mg twice daily.  She says she has no side effects from the drug she is tolerating it quite well.  We discussed about increasing the dose but she wants to hold off because she just got a new supply.  However she is open to increasing the dose when the supply runs out.  She is due for liver function test today.  In terms of overall symptoms things have improved as can be seen below and the symptom score.Marland Kitchen  However her main concern is that of fatigue.  She is open to attending pulmonary rehabilitation.  She is not using oxygen at home.  Sometime back when we tested overnight oxygen desaturation test she did not desaturate.  Walking desaturation test today stable.  She does notice of note that her blood pressure has gone up.  She is working with her primary care physician on this.  She is on losartan.    OV 04/10/2020   Subjective:  Patient ID: Nyra Market, female , DOB: 03/28/1941, age 36 y.o. years. , MRN: [id number],  ADDRESS: [address] [place] [address] PCP  [name], MD Providers : Treatment Team:  Attending Provider: Kalman Shan, MD   Chief Complaint  Patient presents with   Follow-up    CT scan 8/23, has not increased Ofev, shortness of breath with exertion.    Rheumatoid arthritis with Joint s/ILD- Rituxan/steroids (duke uinviesty)  - progressive phenotype (CT 2017/2019 -> Oct 2020). Planned Ofev  start in Nov 2020 but did not start as of dec 2020. 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 Admitted 06/11/2019  - 06/16/2019. Rx with Remdesviri and Decadron.  Admitted August 2021 for respiratory infection not otherwise specified.  Following trip to [place].  Was hypoxemic.  Small Hiatal hernia on CT Oct 2020  Rib fracture from fall Dec 2020: Musculoskeletal: There is nondisplaced fractures of the posterior left fifth, 6, 8, 9, and tenth left ribs. There is unchanged slight superior compression deformities of the T5, T6, T10 vertebral bodies with less than 25% loss in height.Associated pulmnary contusion +  -On Reclast once a year through Dr. Rexene Agent    HPI Nyra Market 83 y.o. -returns for follow-up.  Last week by myself early June 2021.  Then after that in early August 2020 when she got admitted for respiratory infection not otherwise  specified.  She was hypoxemic on room air.  This followed a trip to [place].  In the follow-up phase end of August 2020 when she had high-resolution CT chest that showed increased groundglass opacities but when compared to a CT from 8 months ago.  Also chronic ILD had worsened.  I personally visualized the image at this point in time she is improved from this admission she is not using oxygen at all.  In fact walking desaturation test shows near baseline.  Nevertheless overall her symptom scores have declined over time ILD symptom score is listed  below.  She is really concerned about several issues.  In terms of her ILD she was inquiring about escalating to 150 mg twice daily.  Even at 100 mg twice daily she is significantly symptomatic in terms of nonrespiratory issues such as nausea and dizziness and fatigue.  I did caution her that these could all get worse.  Therefore she is just opted to be at 100 mg twice daily  -Recurrent admissions for respiratory issues [I did address that some of this was Covid and fall related and not necessarily her usual summer exacerbation].  Nevertheless, I asked about exposures at home.  She does not have a feather blanket or feather pillow or feather jacket.  She denies any mold or bird exposure or mildew exposure at home.  Does not do any gardening or wind instruments.  She is immunosuppressed and I did notice that she is not on Bactrim for PCP prophylaxis.  In 2019 when we checked G6PD the lab could not process this lab.  She is interested in Bactrim prophylaxis.  She is interested in okay getting G6PD rechecked   -She is dealing with significant amount of dizziness.  Neurology is sending her to neuro rehab.  It is believed it is multifactorial.  I reviewed her recent CT head angio report.  I personally visualized the image   -She is also worried about cough.  Currently cough is tolerable but she says prior to her exacerbations cough gets worse.  We did discuss with her that if she is aspirating which she denied.  We did discuss about the possibility of starting Bactrim and monitoring her cough and she is fine with that.  I did review old records Lungs/Pleura: Biapical pleuroparenchymal scarring. Patchy and largely peripheral peribronchovascular ground-glass, increased from 07/03/2019. Underlying subpleural reticulation, traction bronchiectasis/bronchiolectasis and scattered honeycombing, similar to minimally increased from 07/03/2019. Calcified granulomas. No pleural fluid. Airway is unremarkable. No air  trapping.   Upper Abdomen: Visualized portions of the liver, adrenal glands, kidneys, spleen, pancreas, stomach and bowel are unremarkable with the exception of a small hiatal hernia. Cholecystectomy. Calcified upper abdominal lymph nodes.   Musculoskeletal: Degenerative changes in the spine. No worrisome lytic or sclerotic lesions.   IMPRESSION: 1. Increased patchy pulmonary parenchymal ground-glass may be due to an atypical/viral pneumonia, including due to COVID-19. 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 2. Aortic atherosclerosis (ICD10-I70.0). Coronary artery calcification.     Electronically Signed   By: Leanna Battles M.D.   On: 03/04/2020 12:58    OV 08/01/2020  Subjective:  Patient ID: Nyra Market, female , DOB: [birth year] , age 41 y.o. , MRN: [id number] , ADDRESS: [address] [place] [address] PCP [name], MD Patient Care Team: [name], MD as PCP - General (Internal Medicine) Jodelle Red,  MD as PCP - Cardiology (Cardiology)  This Provider for this visit: Treatment Team:  Attending Provider: Kalman Shan, MD   Rheumatoid arthritis with Joint s/ILD- Rituxan/steroids (duke uinviesty)  - progressive phenotype (CT 2017/2019 -> Oct 2020). Planned Ofev start in Nov 2020 but did not start as of dec 2020. 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 Admitted 06/11/2019  - 06/16/2019. Rx with Remdesviri and Decadron.  Admitted August 2021 for respiratory infection not otherwise specified.  Following trip to [place].  Was hypoxemic.  Small Hiatal hernia on CT Oct 2020  Rib fracture from fall Dec 2020: Musculoskeletal: There is nondisplaced fractures of the posterior left fifth, 6, 8, 9, and tenth left ribs. There is unchanged slight superior compression deformities of the T5, T6, T10 vertebral bodies with less than 25% loss in height.Associated pulmnary contusion +  -On Reclast once a year through Dr. Rexene Agent    08/01/2020 -   Chief Complaint  Patient presents with   Follow-up    Doing well     HPI CARLIYAH COTTERMAN 83 y.o. -returns for follow-up.  She says since last visit she has lost weight.  In fact tracking her weight it appears she is definitely lost weight.  She has occasional intermittent nausea once every few weeks this is because she does not time her nintedanib well with food.  She has a low appetite as well.  She tried to go up on the Internet but she is unable to.  She takes a low-dose 100 mg twice daily.  She is on Rituxan and prednisone.  She is up-to-date with her COVID-vaccine.  Current shortness of breath standpoint she is stable.  Symptoms are stable.  CT Chest data  OV 10/22/2020  Subjective:  Patient ID: Nyra Market, female , DOB: 12-09-1940 , age 28 y.o. , MRN: [id number] , ADDRESS: [address] [place] [address] PCP [name], MD Patient Care Team: [name], MD as PCP - General (Internal Medicine) Jodelle Red, MD as PCP - Cardiology (Cardiology)  This Provider for this visit: Treatment Team:  Attending Provider: Kalman Shan, MD    10/22/2020 -   Chief Complaint  Patient presents with   Follow-up    Still having shortness of breath with activity, denies  increase in severity.     Rheumatoid arthritis with Joint s/ILD- Rituxan/steroids (duke uinviesty)  - progressive phenotype (CT 2017/2019 -> Oct 2020). Planned Ofev start in Nov 2020 but did not start as of dec 2020. 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 Admitted 06/11/2019  - 06/16/2019. Rx with Remdesviri and Decadron.  - sp evushed x 2  Admitted August 2021 for respiratory infection not otherwise specified.  Following trip to [place].  Was hypoxemic.  Small Hiatal hernia on CT Oct 2020  Rib fracture from fall Dec 2020: Musculoskeletal: There is nondisplaced fractures of the posterior left fifth, 6, 8, 9, and tenth left ribs. There is unchanged slight superior compression deformities of the T5, T6, T10 vertebral bodies with less than 25% loss in height.Associated pulmnary contusion +  -On Reclast once a year through Dr. Rexene Agent  Normal ECHO Summer 2020   HPI MONSERATT LEDIN 83 y.o. -returns for follow-up.  At this point in time for her ILD she is taking nintedanib 100 mg twice daily.  Last liver function test was in December 2021.  She is now attending pulmonary rehabilitation.  She is not sure it is helping her.  She tells me that she does not desaturate at pulmonary rehabilitation so she not using her oxygen.  Review of the records indicate same but it appears that today her dyspnea is worse in terms of symptom score although she is denying that.  Walking desaturation  test in the office today with a forehead probe suggest that pulse ox is declined.  Her pulmonary function test also shows 5% FVC decline.  She was surprised by this.  In talking to her realize that for coexistent obstructive lung disease she is no longer taking Dulera.  She is willing to take inhaler which she will have to twist instead of having to pump with her fingers because she has rheumatoid arthritis.  She is no longer losing weight though.  She had questions about taking monoclonal antibody prophylaxis.  She is under the impression she needs to take it every 6 months with Rituxan dosing.  I did explain to her that in the presence of Rituxan the body is not able to respond actively to vaccine and that is why she is having prophylactic antibody.  She is now had 2 doses the last 1 being a few weeks ago first 1 was in January 2022.  I have written to the [facility] coordinator Lillard Anes 27 monoclonal antibody prophylaxis schedule.  Expressed to patient that because of monoclonal antibody prophylaxis she did not do the vaccine.  Last echocardiogram was in summer 2020.      OV 12/23/2020  Subjective:  Patient ID: Nyra Market, female , DOB: 08/28/1940 , age 20 y.o. , MRN: [id number] , ADDRESS: [address] [place] [address] PCP [name], MD Patient Care Team: [name], MD as PCP - General (Internal Medicine) Jodelle Red, MD as PCP - Cardiology (Cardiology)  This Provider for this visit: Treatment Team:  Attending Provider: Kalman Shan, MD   12/23/2020 -   Chief Complaint  Patient presents with   Follow-up    PFT performed today. Pt states she has been doing okay since last visit and denies any complaints.     HPI MCKENA CHERN 83 y.o. -returns for follow-up.  There is concern for progression.  Last visit we have made sure she added Breo.  She says she feels better.  In fact symptom score is better.  However pulmonary function test shows  continued decline.  She is on the low-dose nintedanib.  She says if she does not take the  nintedanib exactly with food and even if she takes it 15 to 20 minutes later she will have nausea.  Today she had nausea but otherwise if she is diligent she can maintain.  I informed her the pulmonary function test shows continued decline.  She is on Rituxan prednisone and low-dose nintedanib.  She initially said she will not be able to tolerate the higher dose nintedanib but after realizing that her nausea is mild and her weight loss is stable she says she is willing to give the higher dose nintedanib and attend.  I was able to get her some donor sample of nintedanib.  The lot number of this is [id number].  Expiration date is November 2023.  The serial number is [id number].  The GT IN number is [id number]       ECHO 11/3120  IMPRESSIONS     1. Left ventricular ejection fraction, by estimation, is 65 to 70%. The  left ventricle has normal function. The left ventricle has no regional  wall motion abnormalities. Left ventricular diastolic parameters are  consistent with Grade I diastolic  dysfunction (impaired relaxation). Elevated left ventricular end-diastolic  pressure. The E/e' is 79. The average left ventricular global longitudinal  strain is -18.4 %.   2. Right ventricular systolic function is normal. The right ventricular  size is normal. There is mildly elevated pulmonary artery systolic  pressure. The estimated right ventricular systolic pressure is 36.9 mmHg.   3. The mitral valve is abnormal. Mild mitral valve regurgitation.   4. The aortic valve is tricuspid. Aortic valve regurgitation is not  visualized. Mild aortic valve sclerosis is present, with no evidence of  aortic valve stenosis.   5. The inferior vena cava is normal in size with greater than 50%  respiratory variability, suggesting right atrial pressure of 3 mmHg.   Comparison(s): Changes from prior study are noted. 12/18/18  EF 60-65%. PA  pressure .    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  OV 03/10/2021  Subjective:  Patient ID: Nyra Market, female , DOB: 10-Oct-1940 , age 57 y.o. , MRN: [id number] , ADDRESS: [address] [place] [address] PCP [name], MD Patient Care Team: [name], MD as PCP - General (Internal Medicine) Jodelle Red, MD as PCP - Cardiology (Cardiology)  This Provider for this visit: Treatment Team:  Attending Provider: Kalman Shan, MD    03/10/2021 -   Chief Complaint  Patient presents with   Follow-up    Pt states she has questions about ofev she currently on it but having side effects.    Rheumatoid arthritis with Joint s/ILD- Rituxan/steroids (duke uinviesty)  - progressive phenotype (CT 2017/2019 -> Oct 2020). Planned Ofev start in Nov 2020 but did not start as of dec 2020. 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 Admitted 06/11/2019  - 06/16/2019. Rx with Remdesviri and Decadron.  - sp evushed x 2  - 2nd covid by hx June 2022 - evusheld repeat Oct 2022  Admitted August  2021 for respiratory infection not otherwise specified.  Following trip to [place].  Was hypoxemic.  Small Hiatal hernia on CT Oct 2020  Rib fracture from fall Dec 2020: Musculoskeletal: There is nondisplaced fractures of the posterior left fifth, 6, 8, 9, and tenth left ribs. There is unchanged slight superior compression deformities of the T5, T6, T10 vertebral bodies with less than 25% loss in height.Associated pulmnary contusion +  -On Reclast once a year through Dr. 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  -returns for follow-up.  She presents with her husband Park Breed.  Last visit she wanted to increase the nintedanib to 150 mg twice daily but this is made her have more nausea and vomiting.  The diarrhea is very mild.  She then reduced her nintedanib 200 mg twice daily but still having side effects.  She is now started losing weight again.  She is really frustrated by this.  Her husband asked about pirfenidone.  Explained that the research with progressive pulmonary fibrosis is limited in the case of pirfenidone so nintedanib is the first choice.  Did agree that if nintedanib did not work we would try pirfenidone.  She wanted know what her options were.  We discussed about stopping nintedanib and improving quality of life but face the risk of continued progression pulmonary fibrosis.  She was not that enthused about this choice.  We discussed about trying Zofran with the nintedanib.  She seemed more receptive to this idea.  We decided that we will try with 100 mg twice daily at the lower dose and then see.  She believes asthma is under control.  Her pulmonary function test shown below seems to fluctuate.  Overall symptom severity stable.  She wanted a handicap placard signed today which I did.       OV 04/08/2021  Subjective:  Patient ID: Nyra Market, female , DOB: 26-Jan-1941 , age 69 y.o. , MRN: [id number] ,  ADDRESS: [address] [place] [address] PCP [name], MD Patient Care Team: [name], MD as PCP - General (Internal Medicine) Jodelle Red, MD as PCP - Cardiology (Cardiology)  This Provider for this visit: Treatment Team:  Attending Provider: Kalman Shan, MD    04/08/2021 -   Chief Complaint  Patient presents with   Follow-up    Pt states she has been doing okay since last visit and denies any real complaints. States breathing has been doing okay and states the nausea went away.    Rheumatoid arthritis with Joint s/ILD- Rituxan/steroids ([facility]esty)  - progressive phenotype (CT 2017/2019 -> Oct 2020). Planned Ofev start in Nov 2020 but did not start as of dec 2020. 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 Admitted 06/11/2019  - 06/16/2019. Rx with Remdesviri and Decadron.  - sp evushed x 2  Admitted August 2021 for respiratory infection not otherwise specified.  Following trip to [place].  Was hypoxemic.  Small Hiatal hernia on CT Oct 2020  Rib fracture from fall Dec 2020: Musculoskeletal: There is nondisplaced fractures of the posterior left fifth, 6, 8, 9, and tenth left ribs. There is unchanged slight superior compression deformities of the T5, T6, T10 vertebral bodies with less than 25% loss in height.Associated pulmnary contusion +  -On Reclast  once a year through Dr. Rexene Agent  Normal ECHO Summer 2020 -> possible [facility] May 2021 RSVP 30s with GR1 ddx  HPI ALIDA GREINER 83 y.o. 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  Last seen 4 weeks ago.  At that time she was losing weight and having significant GI side effects with nintedanib full dose.  Told her to reduce the nintedanib dose to 100 mg twice daily and also take Zofran as needed.  She is taking Zofran as needed.  She is tolerating the nintedanib low-dose much better.  Symptom scores are listed below.  Overall she feels better.  Weight loss is stabilized.  Infectious gained 1 pound of weight.  She feels that she will just take with nintedanib 100 mg twice daily using Zofran as needed.  She does not want to change the schedule.  I fully agree with her and supported on that.  She is aware that the low-dose is less effective but this might be best balance between risk and benefit.  She did ask about getting a COVID by Valent mRNA booster 2 oh micron and BA.5.  According to history she has had COVID x2.  Most recently was in June 2022 which would be against the current viral strain.  In addition she is got monoclonal antibody prophylaxis euvsheld in past.  She does socially isolate herself to the extent possible and masks.  She is going to get the monoclonal antibody prophylaxis again in a few weeks when she gets her Rituxan.  We did discuss the fact that because she is on Rituxan she is immunosuppressed and does not make antibodies and then the facet antibody transfer that she gets through [facility] is risk protective.  Additional risk protective feature is the fact that she had a current strain of COVID-19 and potentially has natural immunity.  Therefore it is her call to get the bivaet booster although she wanted to avoid it and I supported this in a shared decision making she could hold off till at least January 2023 when it  will be 6 months since her natural infection.     OV 07/18/2021  Subjective:  Patient ID: Nyra Market, female , DOB: 28-Mar-1941 , age 44 y.o. , MRN: [id number] , ADDRESS: [address] [place] [address] PCP [name], MD Patient Care Team: [name], MD as PCP - General (Internal Medicine) Jodelle Red, MD as PCP - Cardiology (Cardiology)  This Provider for this visit: Treatment Team:  Attending Provider: Kalman Shan, MD    07/18/2021 -   Chief Complaint  Patient presents with   Follow-up    Pt states  she has been doing okay since last visit. States her breathing is about the same.    Rheumatoid arthritis with Joint s/ILD- Rituxan/steroids ([facility]ke uinviesty)  - progressive phenotype (CT 2017/2019 -> Oct 2020). Planned Ofev start in Nov 2020 but did not start as of dec 2020. 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 Admitted 06/11/2019  - 06/16/2019. Rx with Remdesviri and Decadron.  - sp evushed x 2  Admitted August 2021 for respiratory infection not otherwise specified.  Following trip to [place].  Was hypoxemic.  Small Hiatal hernia on CT Oct 2020  Rib fracture from fall Dec 2020: Musculoskeletal: There is nondisplaced fractures of the posterior left fifth, 6, 8, 9, and tenth left ribs. There is unchanged slight superior compression deformities of the  T5, T6, T10 vertebral bodies with less than 25% loss in height.Associated pulmnary contusion +  -On Reclast once a year through Dr. Rexene Agent  Normal ECHO Summer 2020 -> possible [facility] May 2021 RSVP 30s with GR1 ddx   xxxx HPI AKI BURDIN 83 y.o. -presents for follow-up.  Last visit September 2022.  Since then she is doing stable.  With a lower dose of nintedanib no more diarrhea.  Weight loss is stopped.  Barely any nausea.  She is not needing Zofran for nausea control.  Her main concern right now is that she is having imbalance issues.  She is stopped working with the physical therapy.  She thinks it is 1 of proprioception but also long-term muscle loss and sarcopenia.  I have advised her to take this with her primary care/rheumatology.  Terms of asthma is stable on Breo and Singulair.  Last liver function test April 2022 in our chart.  Latest pulmonary function test shows stability    CT Chest data  No results found.            OV 03/06/2022  Subjective:  Patient ID: Nyra Market, female , DOB: [birth year] , age 80 y.o. , MRN: [id number] , ADDRESS: [address] [place] [address] PCP [name], MD Patient Care Team: [name], MD as PCP - General (Internal Medicine) Jodelle Red, MD as PCP - Cardiology (Cardiology)  This Provider for this visit: Treatment Team:  Attending Provider: Kalman Shan, MD    03/06/2022 -   Chief Complaint  Patient presents with   Follow-up    PFT performed today.  Pt states she has been doing okay since last visit. States her breathing has been doing okay but states she has been having some problems with balance.    HPI SOPHONIE GOFORTH 83 y.o. -returns for follow-up.  Last seen in January 2023.  She says that from a breathing standpoint she is stable but she has had balance issues.  She has had 2 accidental falls.  The first 1 was in March 2023 while  entering the house along the steps and she  landed in the bushes and had 25 stitches to her left lower extremity.  She also had collarbone fracture.  Then again a fall in the driveway 2 months ago but with just bruising.  She is now going to undergo physical therapy.  ENT evaluation did not show any ENT causes for the fall.  In terms of her dyspnea and walking desaturation test in the office she is stable but her pulmonary function test shows continued decline.  She is on nintedanib at the lower dose because she is not able to tolerate the higher dose.  On this lower dose she still requiring Zofran for control of nausea.  She takes Zofran preemptively.  There are no other issues.  The last echo and CT scan was over a year ago.     OV 12/11/2022  Subjective:  Patient ID: Nyra Market, female , DOB: 10/02/1940 , age 24 y.o. , MRN: [id number] , ADDRESS: [place] [address] [place] [address] PCP Eloisa Northern, MD Patient Care Team: Eloisa Northern, MD as PCP - General (Internal Medicine) Jodelle Red, MD as PCP - Cardiology (Cardiology)  This Provider for this visit: Treatment Team:  Attending Provider: Kalman Shan, MD    12/11/2022 -   Chief Complaint  Patient presents with   Follow-up    F/up, in the hosp. 3 weeks ago, and wants to know why she was in the hosp.     HPI GENEAN ADAMSKI 83 y.o. -was last seen in August 2023.  After that her husband has passed away 09/17/2022 following motor vehicle accident when he was crossing the road.  .  Then she was admitted for 3 days 11/17/2022 through 11/20/2022 with nausea and vomiting.  She was also diagnosed with pneumonia.  The hospitalist contacted me.  Out of caution advised him to hold off giving any nintedanib she did receive IV community-acquired pneumonia therapy.  Review of the labs indicate she did not suffer from acute kidney injury.  Her discharge sodium was slightly low at 134.  Blood cultures negative.She had CT abdomen and pelvis 11/11/2022 that  did not show any acute process in the abdomen. She was discharged to SNF.  She tells me that at baseline she was taking Zofran once daily with some mild nintedanib induced nausea but the onset of nausea for this admission was quite abrupt and severe.  She does not know the cause.  After discharge from the SNF 2 weeks ago and now for the last 10 days she is back taking nintedanib.  She is having some mild drug-induced nausea and she says she is able to combat this with taking Zofran twice daily.  At this point in time she prefers to take Zofran along with the nintedanib and see what happens.   For a respiratory standpoint she is frustrated she not been able to get a pulmonary function test since August 2023.  Her ill health and also PFT machine not working have delayed this.  Subjectively she is feeling the same and clinically she feels stable from an ILD standpoint.  Therefore I suspect it is stable.   OV 03/02/2023  Subjective:  Patient ID: Nyra Market, female , DOB: 03-10-1941 , age 38 y.o. , MRN: [id number] , ADDRESS: [address] [place] [address] PCP [name], MD Patient Care Team: [name], MD as PCP - General (Internal Medicine) Jodelle Red, MD as PCP -  Cardiology (Cardiology)  This Provider for this visit: Treatment Team:  Attending Provider: Kalman Shan, MD  2    03/02/2023 -   Chief Complaint  Patient presents with   Follow-up    F/up on PFT     HPI ANADIA HELMES 83 y.o. -returns for follow-up.  She at this point is tolerating her nintedanib at 100 mg twice daily but she is increased her Zofran.  This visit is to see her pulmonary function test.  In May 2024 she did have CT abdomen the lung images and that she did show progressive ILD compared to 2020 07/2020.  Her pulmonary function test during this time had declined but today the FVC is improved while the DLCO is worse.  I think overall this will be stable compared to a year ago  but directionally she does have progressive phenotype.  Her last liver function test was in May 2024.  She will have repeat liver function test today.  She also will participate in the ILD-Pro research registry protocol.  I did discuss with her about inhaled pirfenidone versus placebo in a clinical trial development protocol.  She is interested in this and I gave her a copy of the consent.     OV 08/26/2023  Subjective:  Patient ID: Nyra Market, female , DOB: 1940/09/05 , age 14 y.o. , MRN: [id number] , ADDRESS: [place] [address] [place] [address] PCP Eloisa Northern, MD Patient Care Team: Eloisa Northern, MD as PCP - General (Internal Medicine) Jodelle Red, MD as PCP - Cardiology (Cardiology)  This Provider for this visit: Treatment Team:  Attending Provider: Kalman Shan, MD    Nintedanib/Ofev requires intensive drug monitoring due to high concerns for Adverse effects of , including  Drug Induced Liver Injury, significant GI side effects that include but not limited to Diarrhea, Nausea, Vomiting,  and other system side effects that include Fatigue,  weight loss. Cardiac side effects are a black box warning as well. These will be monitored with  blood work such as LFT initially once a month for 6 months and then quarterly   08/26/2023 -   Chief Complaint  Patient presents with   Follow-up    ILD F/U      HPI ARFA LAMARCA 83 y.o. -returns for follow-up.  She is now relocated to a house near the office.  This is because she became a widow.  However shortly after moving to the house she picked up the influenza.  This was early February 2025 this year this month.  She was hospitalized in mild respiratory failure and 1 day of acute kidney injury and some dehydration.  Since then she has been admitted to skilled nursing facility rehabilitation.  She needs 1 more week of this before discharge.  However she continues nintedanib for her ILD and it is  stable.  She also maintains a every 69-month Rituxan.  The last dose was in August 2024.  The next dose is due 08/30/2023 at [facility].  She says now being a widow and living alone and also being deconditioned after influenza she is wondering if she can get her Rituxan locally.  I did indicate to her that our office has an adjacent infusion center run by [facility].  She was delighted to hear this because there is only 1 mile from her house.  Therefore I have corresponded with Dr. Kennieth Francois at [facility] and also pharmacy team to see if  Rituxan is still indicated and if so the dosage.  Waiting to hear on that.  Given all this she wants to hold off on in participating on any recent study.  On exam she did have crackles and squeaks but she does not feel she is in an exacerbation.  She is due for an ILD-pro registry visit today.   Last CT scan of the chest was in 2021 Last pulmonary function test was in August 2024   Of note she tells me that for the last few months she has had pedal edema and this been no workup.  Hospital lab review does not show she has had a BNP or echo.  Last echo was in 2022.   For this visit external records from the hospital were reviewed.  Also communication was established with other providers.  She is on    OV 09/09/2023  Subjective:  Patient ID: Nyra Market, female , DOB: 1941/05/08 , age 24 y.o. , MRN: [id number] , ADDRESS: [place] [address] [place] [address] PCP Eloisa Northern, MD Patient Care Team: Eloisa Northern, MD as PCP - General (Internal Medicine) Jodelle Red, MD as PCP - Cardiology (Cardiology)  This Provider for this visit: Treatment Team:  Attending Provider: Kalman Shan, MD    09/09/2023 -     HPI Nyra Market 83 y.o. -acute visit posthospitalization.  She just saw me 08/26/2023.  Then 4 days later on 08/30/2023 she ended up in the ER.  Review of the records show that she  had vomiting the previous day and then the next morning she was no acute hypoxic respiratory failure.  She got treated with antibiotics and steroids.  She did have an echocardiogram that did not show pulmonary hypertension.  She also had a BNP that was normal.  She had significant pedal edema.  Her UA was abnormal.  A the cellulitis of urinary tract infection was because of her vomiting but she is on nintedanib.  She was treated with antibiotics.  She is now even more physically deconditioned but managing.  She is off oxygen at rest.  She did get some steroids.  She is discharged and she is presenting with one of her friends and a caretaker.   PFT    OV 10/01/2023  Subjective:  Patient ID: Nyra Market, female , DOB: [birth year] , age 55 y.o. , MRN: [id number] , ADDRESS: [place] [address] [place] [address] PCP Eloisa Northern, MD Patient Care Team: Eloisa Northern, MD as PCP - General (Internal Medicine) Jodelle Red, MD as PCP - Cardiology (Cardiology) Type of visit: Video Virtual Visit Identification of patient PANTERA WINTERROWD with Feb 02, 1941 and MRN [id number] - 2 person identifier Risks: Risks, benefits, limitations of telephone visit explained. Patient understood and verbalized agreement to proceed Anyone else on call: her friend Patient location: her rejab jp,e This provider location: [address]; [place]; [place] [address]. [facility]. [phone]     Rheumatoid arthritis with Joint s/ILD- Rituxan/steroids ([facility] uinviesty)  - progressive phenotype (CT 2017/2019 -> Oct 2020). Planned Ofev start in Nov 2020 but did not start as of dec 2020. LFT normal Jan 2021   - Rituxan via [facility] -September 2021 + Medrol 4mg /2mg  QOD baseline many decades (on 8mg  per day since feb 2021 due to recurrent asthma flares)  - nintedanib for her ILD she started it on July 31, 2019 - started 100mg  twice daily (started low  dose du eto  side effect concern) -> increased to 150mg  bid d June 2022 -> reduced to 100mg  bid aug 2022 due tow eight loss  - ILD PRO registry study - since 08/16/19  Athma/obstructive lung disease phenotype on Dulera/Singulair  Post herpetic neuralgia - on elavil, gabapentin, effexor  Coronary Artery Calcification - 1 vessel -  S/p - covid-19 on 05/31/2019. Admitted 06/11/2019  - 06/16/2019. Rx with Remdesviri and Decadron.  - sp evushed x 2 Status once influenza hospitalization for mild respiratory failure February 2025.  Admitted August 2021 for respiratory infection not otherwise specified.  Following trip to [place].  Was hypoxemic.  Small Hiatal hernia on CT Oct 2020  Rib fracture from fall Dec 2020: Musculoskeletal: There is nondisplaced fractures of the posterior left fifth, 6, 8, 9, and tenth left ribs. There is unchanged slight superior compression deformities of the T5, T6, T10 vertebral bodies with less than 25% loss in height.Associated pulmnary contusion +  -On Reclast once a year through Dr. Rexene Agent  Normal ECHO Summer 2020 -> possible [facility] May 2021 RSVP 30s with GR1 ddx  -  Normal ECHO may 202    This Provider for this visit: Treatment Team:  Attending Provider: Kalman Shan, MD   10/01/2023 -    HPI Nyra Market 83 y.o. -just saw her 3 weeks ago.  She still has a follow-up from hospitalization that is pending in 1 week.  When I saw her last time she was extremely fatigued after the hospitalization that was for vomiting and also cellulitis.  I advised her to stop her nintedanib and which she did for 2 weeks and then restarted currently taking 100 mg low-dose protocol twice daily.  She is tolerating it fine.  She said stopping and restarting it did not make any difference.  But the main complaint she has is that she is feeling poorly.  She feels fatigued [although on the camera I could not tell a difference].  She saw Dr. Timothy Lasso her primary care physician 4 days ago  this week and was found to be appropriate hypoxemic 83% on room air.  Since then she has been on 3 L nasal cannula continuous.  She is on antibiotics and prednisone.  Apparently chest x-ray showed ILD along with pneumonia.  She is also having insomnia.  It since she is now 3 months past her Rituxan which she wants to get it at our infusion center through [facility].  She feels that her ILD is worse or her symptoms are worse because of the lack of Rituxan and she really wants to go back on Rituxan.  Nevertheless there is no worsening in arthritis.  She did have labs at primary care but I do not have access to this.     SYMPTOM SCALE - ILD 03/27/2019  05/24/2019 (2-3 weeks    .mrmonpre cpovid)  08/03/2019 Post covid Post fall rib # 12/18/2019  04/10/2020 142# resp virus admit aug 2021 nos 08/01/2020 134#  10/22/2020 136# Now doing rehab 12/23/2020 136# 03/10/2021 131# Ofev 150mg  bid 04/08/2021 132# Ofev 100mg  bid 07/18/2021 133# ofev 100 x 2 03/06/2022 132#, ofev 100x2 12/11/2022 128# 03/02/2023  09/09/2023 POST ADMIT  O2 use ra       ra ra ra ra ra ra ra ra  Shortness of Breath 0 -> 5 scale with 5 being worst (score 6 If unable to do)                At rest  0 2 3 2 3 0 3 0 3 1  2 1  4 3   Simple tasks - showers, clothes change, eating, shaving 0 3 2 3 4 1 3 2 3 1  3 3  3 3   Household (dishes, doing bed, laundry) 2 4 5 3 4 3 3 3 4 3 3 4  4 5   Shopping 2 3 6 4 4 4 4 3 4 4 4 4  4 5   Walking level at own pace 2 3 4 4 3 2 4 2 3 3 3 4  4 5   Walking up Stairs 3 3 5 4 4 4 4 3 4 4 4 5  4 5   Total (40 - 48) Dyspnea Score 9 17 25 20 22 14 21 13 21 16 19 21  23 26   How bad is your cough? 2 2 3 1 2  0 0 0 1 0 0 0  3 1  How bad is your fatigue 3 3.5 5 3.5 3 4 3 2 3 3 4 4  3 5   nause    0 1 1 0 1 2.5 1 1 1   0 0  vomit    0 1 1 0 1 2.4 1 1 1   0 0  diarrhea    0 1 0 0 0 1 0 0 0  0 3  anxiety    0 2 0 1 0 1 0 0 1  0 0  depression    0 1 0 0 0 0 0 0 0  0 0     Simple office walk  feet x  3 laps goal  with forehead probe 04/12/2018  07/21/2018  05/24/2019  12/18/2019  04/10/2020  08/01/2020  10/22/2020  03/10/2021  07/18/2021  03/06/2022  12/11/2022  08/26/2023   O2 used Room air - off o2 x 10 min Room air  Room air ra ra ra ra ra ra ra ra ra  Number laps completed 3 x 185 feet 3 x 250 feet    r    3 1 lap Sit and satnd - di dnot need walker  Comments about pace normal normal   Slow, no walker Slow . No walker.  Slow. No walker. No cane walker   waler   Resting Pulse Ox/HR 99% and 72/min 97% and 74/min 97% and 81/min 97% and 76/m 96% and 88/min 94% and 83 96% and HR 79 97% and HR 51 100% and 78 99% and HR 73 97% and HR 76 98% and HR 78  Final Pulse Ox/HR 98% and 95/min 96% and 96/min 94% and 107/min 93% and 94 92% and 97 90% and 100 89% and 104/min 92% and 106 97% and 102 92% and HR 105 94% ad HR 80 93% and HR 90  Desaturated </= 88% no no no no no yes yes no no no    Desaturated <= 3% points no no Yes, 3 pints Ues, 4 points Yes,  4 points Yes, 4 points Yes, 7 points  Yes, 3 pints Yes, 7 poins  Yes 5 points  Got Tachycardic >/= 90/min yes yes yes yes yes yes yes   yes    Symptoms at end of test No complaint Mild dyspnea Mod dyspnea Some dyspnea dyspnea dyspnie cat 3d Dyspneic moderate  Dyspneic and wheezing Mod dyspnea Mopd dyspea    Miscellaneous comments improed from hospital Same v improved woprse same  Wobbly gait baseline Needed 2 break  Stopped x 2 to get balanced and  staggered while alking       =-   PFT     Latest Ref Rng & Units 03/01/2023   11:45 AM 03/06/2022    3:01 PM 07/02/2021    3:07 PM 03/07/2021   11:05 AM 12/23/2020    1:03 PM 10/22/2020    9:05 AM 09/10/2020    9:52 AM  PFT Results  FVC-Pre L 1.81  1.62  1.69  1.78  1.66  1.73  1.76   FVC-Predicted Pre % 69  61  62  66  60  63  64   Pre FEV1/FVC % % 76  74  73  72  71  76  75   FEV1-Pre L 1.39  1.20  1.23  1.28  1.17  1.30  1.32   FEV1-Predicted Pre % 71  61  61  64  57  64  64   DLCO uncorrected ml/min/mmHg 11.53   12.57  15.35  12.94  14.98  13.67  15.38   DLCO UNC% % 59  65  79  66  77  70  79   DLCO corrected ml/min/mmHg 11.53  12.57  15.35  12.94  14.98  13.80  15.38   DLCO COR %Predicted % 59  65  79  66  77  71  79   DLVA Predicted % 78  84  100  84  86  80  102        LAB RESULTS last 96 hours No results found.       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   reports that she has never smoked. She has never used smokeless tobacco.  Past Surgical History:  Procedure Laterality Date   CHOLECYSTECTOMY  2011   IR ANGIO INTRA EXTRACRAN SEL COM CAROTID INNOMINATE BILAT MOD SED  06/18/2020   IR ANGIO VERTEBRAL SEL SUBCLAVIAN INNOMINATE UNI R MOD SED  06/18/2020   IR ANGIO VERTEBRAL SEL VERTEBRAL UNI L MOD SED  06/18/2020   WISDOM TOOTH EXTRACTION      Allergies  Allergen Reactions   Penicillins Other (See Comments)    Unknown reaction  Did it involve swelling of the face/tongue/throat, SOB, or low BP? Unknown Did it involve sudden or severe rash/hives, skin peeling, or any reaction on the inside of your mouth or nose? Unknown Did you need to seek medical attention at a hospital or doctor's office? No When did it last happen?     childhood  If all above answers are "NO", may proceed with cephalosporin use.     Remicade [Infliximab] Other (See Comments)    Unknown reaction    Immunization History  Administered Date(s) Administered   Fluad Quad(high Dose 65+) 04/23/2020, 03/23/2022   Influenza Split 04/12/2013, 04/12/2014, 03/14/2015, 04/23/2016   Influenza, High Dose Seasonal PF 04/12/2017, 03/24/2018, 04/13/2019, 03/27/2021   Influenza-Unspecified 04/03/2013,  04/22/2017   Moderna Sars-Covid-2 Vaccination 08/25/2019, 09/22/2019, 03/07/2020   Pfizer Covid-19 Vaccine Bivalent Booster 71yrs & up 05/16/2021   Pneumococcal Conjugate-13 04/03/2013, 09/28/2014   Pneumococcal Polysaccharide-23 07/13/2012, 07/24/2013   Respiratory Syncytial Virus Vaccine,Recomb Aduvanted(Arexvy) 04/12/2022   Td 01/11/2018   Tdap 12/10/2017    Family History  Problem Relation Age of Onset   Asthma Mother    Anemia Mother  Polymyalgia rheumatica Mother    COPD Father    Pulmonary fibrosis Father    Breast cancer Maternal Grandmother 66     Current Outpatient Medications:    acetaminophen (TYLENOL) 500 MG tablet, Take 1,000 mg by mouth every 6 (six) hours as needed for moderate pain (pain score 4-6)., Disp: , Rfl:    amitriptyline (ELAVIL) 25 MG tablet, Take 25 mg by mouth at bedtime., Disp: , Rfl: 0   Calcium Carbonate-Vitamin D (CALCIUM-VITAMIN D PO), Take 1 tablet by mouth in the morning and at bedtime. 500-25 mg/mcg, Disp: , Rfl:    cholecalciferol (VITAMIN D3) 25 MCG (1000 UNIT) tablet, Take 1,000 Units by mouth every evening., Disp: , Rfl:    furosemide (LASIX) 40 MG tablet, Take 1 tablet (40 mg total) by mouth daily., Disp: , Rfl:    gabapentin (NEURONTIN) 300 MG capsule, Take 600 mg by mouth at bedtime., Disp: , Rfl:    ketorolac (TORADOL) 10 MG tablet, Take 1 tablet (10 mg total) by mouth every 6 (six) hours as needed., Disp: , Rfl:    lidocaine 4 %, Place 1 patch onto the skin daily. Apply to coccyx, Disp: , Rfl:    losartan (COZAAR) 25 MG tablet, Take 25 mg by mouth daily., Disp: , Rfl:    melatonin 5 MG TABS, Take 1 tablet (5 mg total) by mouth at bedtime., Disp: , Rfl:    Nintedanib (OFEV) 100 MG CAPS, Take 1 capsule (100 mg total) by mouth 2 (two) times daily., Disp: 60 capsule, Rfl: 10   Nutritional Supplements (ENSURE ORIGINAL) LIQD, Take 1 Bottle by mouth in the morning and at bedtime., Disp: , Rfl:    ondansetron (ZOFRAN) 4 MG tablet, TAKE ONE  TABLET BY MOUTH EVERY 8 HOURS AS NEEDED FOR NAUSEA AND VOMITING (Patient taking differently: Take 4 mg by mouth every 8 (eight) hours as needed for nausea or vomiting.), Disp: 30 tablet, Rfl: 5   pantoprazole (PROTONIX) 40 MG tablet, Take 40 mg by mouth daily., Disp: , Rfl:    polyethylene glycol (MIRALAX / GLYCOLAX) 17 g packet, Take 17 g by mouth daily as needed for mild constipation., Disp: 14 each, Rfl: 0   potassium chloride SA (KLOR-CON M) 20 MEQ tablet, Take 1 tablet (20 mEq total) by mouth daily., Disp: , Rfl:    pravastatin (PRAVACHOL) 20 MG tablet, Take 1 tablet (20 mg total) by mouth daily., Disp: 90 tablet, Rfl: 1   venlafaxine XR (EFFEXOR-XR) 75 MG 24 hr capsule, Take 75 mg by mouth daily., Disp: , Rfl:       Objective:   There were no vitals filed for this visit.  Estimated body mass index is 20.01 kg/m as calculated from the following:   Height as of 09/09/23: 5\' 6"  (1.676 m).   Weight as of 09/09/23: 124 lb (56.2 kg).  @WEIGHTCHANGE @  There were no vitals filed for this visit.   Physical Exam   General: No distress.  Well-dressed  O2 at rest: yes, pulse ox on room air was 85% in my presence but with her own finger pulse ox Cane present: tes Sitting in wheel chair: bi Frail: tes Obese: bi Neuro: Alert and Oriented x 3. GCS 15. Speech normal Psych: Pleasant Resp:  =No overt respiratory distress      Assessment:     No diagnosis found.     Plan:     Patient Instructions  History of asthma Interstitial lung disease due to connective tissue disease (HCC) Encounter  for long-term current use of high risk medication  -Unclear if ILD is getting worse.  He seem to have hypoxemia but this could also be diastolic dysfunction or bronchitis flareup.   Plan - Continue nintedanib -Continue oxygen -Continue acute treatment of antibiotic and prednisone from Dr. Timothy Lasso -Continue Breo -Await labs from Dr. Timothy Lasso -Sent a message to our pharmacy team to contact [facility] and get her Rituxan started  -I would only indicate about the safety of Rituxan at any given point in time based on her overall health but I will not prescribe it for her RA -See back in 1 week -If you get worse go to the ER -You might need labs and visit  PEDAL EDEMA x few months ECHO - no pulmonary hypertension Feb 2025 BNP - normal FEb 2025  Plan - Per PCP Eloisa Northern, MD   Followup 10/07/2023 prebooked appointment   FOLLOWUP Return for 10/07/2023.    SIGNATURE    Dr. Kalman Shan, M.D., F.C.C.P,  Pulmonary and Critical Care Medicine Staff Physician, [facility] Center Director - Interstitial Lung Disease  Program  Pulmonary Fibrosis [facility] at [facility] [place], [place], [address]  Pager: [phone], If no answer or between  15:00h - 7:00h: call [phone] Telephone: [phone]  3:01 PM 10/01/2023

## 2023-10-01 NOTE — Patient Instructions (Addendum)
 History of asthma Interstitial lung disease due to connective tissue disease (HCC) Encounter for long-term current use of high risk medication  -Unclear if ILD is getting worse.  He seem to have hypoxemia but this could also be diastolic dysfunction or bronchitis flareup.   Plan - Continue nintedanib -Continue oxygen -Continue acute treatment of antibiotic and prednisone from Dr. Timothy Lasso -Continue Breo -Await labs from Dr. Timothy Lasso -Sent a message to our pharmacy team to contact [facility] and get her Rituxan started  -I would only indicate about the safety of Rituxan at any given point in time based on her overall health but I will not prescribe it for her RA -See back in 1 week -If you get worse go to the ER -You might need labs and visit  PEDAL EDEMA x few months ECHO - no pulmonary hypertension Feb 2025 BNP - normal FEb 2025  Plan - Per PCP Eloisa Northern, MD   Followup 10/07/2023 prebooked appointment

## 2023-10-01 NOTE — Telephone Encounter (Signed)
 [place] -> LESLIE: pls get lab work from PCP Eloisa Northern, MDor Dr Timothy Lasso form this week  RX team: [facility] wants [facility] to prescribe her Rituxan and she have it here. Her new rheumatologist is Dr Sherrilyn Rist. Please get the ordrs from them to give Rituxan here at [facility] infusion center.

## 2023-10-04 DIAGNOSIS — J440 Chronic obstructive pulmonary disease with acute lower respiratory infection: Secondary | ICD-10-CM | POA: Diagnosis not present

## 2023-10-04 DIAGNOSIS — J189 Pneumonia, unspecified organism: Secondary | ICD-10-CM | POA: Diagnosis not present

## 2023-10-04 DIAGNOSIS — I10 Essential (primary) hypertension: Secondary | ICD-10-CM | POA: Diagnosis not present

## 2023-10-04 DIAGNOSIS — J9611 Chronic respiratory failure with hypoxia: Secondary | ICD-10-CM | POA: Diagnosis not present

## 2023-10-04 DIAGNOSIS — J849 Interstitial pulmonary disease, unspecified: Secondary | ICD-10-CM | POA: Diagnosis not present

## 2023-10-04 DIAGNOSIS — M0510 Rheumatoid lung disease with rheumatoid arthritis of unspecified site: Secondary | ICD-10-CM | POA: Diagnosis not present

## 2023-10-04 NOTE — Telephone Encounter (Signed)
 Called and LM with Dr Ferd Hibbs office- medical records- asked them to fax labs to me Will await fax.

## 2023-10-04 NOTE — Telephone Encounter (Signed)
 Last rituximab infusion was in June 2024. Did not receive infusion in December 204 due to rib fracture. Spoke with Dedra Skeens, nurse at [facility]. Infusion center order doc faxed to [facility] ATTN Dr. Trisha Mangle today  Fax: [phone] ATTN: Dr. Trisha Mangle Phone: [phone]  Chesley Mires, PharmD, MPH, BCPS, CPP Clinical Pharmacist (Rheumatology and Pulmonology)

## 2023-10-04 NOTE — Telephone Encounter (Signed)
 Email sent to infusion center pharmacist in regards to placing orders. [facility] must place the orders - I cannot do this on behalf of them  Chesley Mires, PharmD, MPH, BCPS, CPP Clinical Pharmacist (Rheumatology and Pulmonology)

## 2023-10-05 DIAGNOSIS — I10 Essential (primary) hypertension: Secondary | ICD-10-CM | POA: Diagnosis not present

## 2023-10-05 DIAGNOSIS — J440 Chronic obstructive pulmonary disease with acute lower respiratory infection: Secondary | ICD-10-CM | POA: Diagnosis not present

## 2023-10-05 DIAGNOSIS — J9611 Chronic respiratory failure with hypoxia: Secondary | ICD-10-CM | POA: Diagnosis not present

## 2023-10-05 DIAGNOSIS — M0510 Rheumatoid lung disease with rheumatoid arthritis of unspecified site: Secondary | ICD-10-CM | POA: Diagnosis not present

## 2023-10-05 DIAGNOSIS — J849 Interstitial pulmonary disease, unspecified: Secondary | ICD-10-CM | POA: Diagnosis not present

## 2023-10-05 DIAGNOSIS — J189 Pneumonia, unspecified organism: Secondary | ICD-10-CM | POA: Diagnosis not present

## 2023-10-06 ENCOUNTER — Ambulatory Visit: Payer: Self-pay | Admitting: Internal Medicine

## 2023-10-06 NOTE — Telephone Encounter (Signed)
 E2C2 Pulmonary Triage - Initial Assessment   Fall today; dizziness, lightheaded; not currently having symptoms SOB x3 weeks, intermittent; dizziness is more frequent per facility RN  "Have you used any OTC meds to help with symptoms?" No If yes, ask "What medications?" Does not have any  OXYGEN: "Do you wear supplemental oxygen?" Yes If yes, "How many liters are you supposed to use?" 3L continuous; RN at facility does not think she needs it all the time  "Do you monitor your oxygen levels?" Yes If yes, "What is your reading (oxygen level) today?" 100%  "What is your usual oxygen saturation reading?"  (Note: Pulmonary O2 sats should be 90% or greater) 95-99%   Additional Notes: Spoke with facility RN, Kyla Balzarine. Pt is currently on 3L of O2 continuously. Facility RN thinks this may be too much O2 for pt and pt is "getting dependent" on it. Pt has had intermittent dizziness and lightheadedness and had a fall today. Pt did not hit head and did not break skin. Pt has right jaw swelling and redness but pt declined a scan at facility. Pt can move her jaw. Being treated with ice and tylenol. Pt has an appt tmrw with pulmonologist, Dr. Marchelle Gearing. Facility RN called to make him aware.    Copied from CRM [id number]. Topic: Clinical - Red Word Triage >> Oct 06, 2023  2:34 PM Isabell A wrote: [facility] that prompted transfer to Nurse Triage: Kandee Keen, nurse at her current facility calling to leave message for [facility] - currently on three liters of oxygen, feels like it may be to strong for her. Reporting alot of light headness and dizziness.   Patient had a fall today due to light headness/dizziness. Reason for Disposition  MILD weakness (i.e., does not interfere with ability to work, go to school, normal activities)  (Exception: Mild weakness is a chronic symptom.)  Answer Assessment - Initial Assessment Questions Fall today; dizziness, lightheaded; not currently having symptoms SOB x3  weeks, intermittent; dizziness is more frequent per facility RN  "Have you used any OTC meds to help with symptoms?" No If yes, ask "What medications?" Does not have any  OXYGEN: "Do you wear supplemental oxygen?" Yes If yes, "How many liters are you supposed to use?" 3L continuous; RN at facility does not think she needs it all the time  "Do you monitor your oxygen levels?" Yes If yes, "What is your reading (oxygen level) today?" 100%  "What is your usual oxygen saturation reading?"  (Note: Pulmonary O2 sats should be 90% or greater) 95-99%  Protocols used: Falls and [facility]

## 2023-10-07 ENCOUNTER — Encounter: Payer: Self-pay | Admitting: Internal Medicine

## 2023-10-07 ENCOUNTER — Ambulatory Visit (INDEPENDENT_AMBULATORY_CARE_PROVIDER_SITE_OTHER): Payer: Medicare Other | Admitting: Internal Medicine

## 2023-10-07 VITALS — BP 114/65 | HR 83 | Temp 98.0°F | Ht 66.0 in | Wt 125.0 lb

## 2023-10-07 DIAGNOSIS — Z09 Encounter for follow-up examination after completed treatment for conditions other than malignant neoplasm: Secondary | ICD-10-CM | POA: Diagnosis not present

## 2023-10-07 DIAGNOSIS — R5381 Other malaise: Secondary | ICD-10-CM | POA: Diagnosis not present

## 2023-10-07 DIAGNOSIS — R600 Localized edema: Secondary | ICD-10-CM | POA: Diagnosis not present

## 2023-10-07 DIAGNOSIS — J189 Pneumonia, unspecified organism: Secondary | ICD-10-CM | POA: Diagnosis not present

## 2023-10-07 DIAGNOSIS — J440 Chronic obstructive pulmonary disease with acute lower respiratory infection: Secondary | ICD-10-CM | POA: Diagnosis not present

## 2023-10-07 DIAGNOSIS — J849 Interstitial pulmonary disease, unspecified: Secondary | ICD-10-CM

## 2023-10-07 DIAGNOSIS — M0510 Rheumatoid lung disease with rheumatoid arthritis of unspecified site: Secondary | ICD-10-CM | POA: Diagnosis not present

## 2023-10-07 DIAGNOSIS — R296 Repeated falls: Secondary | ICD-10-CM

## 2023-10-07 DIAGNOSIS — J9611 Chronic respiratory failure with hypoxia: Secondary | ICD-10-CM | POA: Diagnosis not present

## 2023-10-07 DIAGNOSIS — I10 Essential (primary) hypertension: Secondary | ICD-10-CM | POA: Diagnosis not present

## 2023-10-07 NOTE — Telephone Encounter (Signed)
 She always has had balance issues.  This is guarded with her rheumatoid arthritis and overall physical frailty.  It does not have to do with her  oxygen use but we will test her for oxygen assessment today

## 2023-10-07 NOTE — Progress Notes (Signed)
 Brief patient profile:  50 yowf  never smoker with allergies/inhalers as child outgrew by [employer] then  RA since around 2000  Prednisone  x decades and prev eval by Dr Delford Field around 2004 for sob resolved s maint rx and referred 05/26/2013 by Dr Renne Crigler for bronchitis and abn cxr   History of Present Illness  05/26/2013 1st [facility] office visit/ Wert cc June 2014 dx pna  In [place] and remicade stopped and 100% better and placed arencia in September 2014  then abruptly worse first week in November with cough green sputum s nasal symptoms, fever low grade and no cp or cough and completely recovered prior to OV does not recall abx but issue is why keeps getting sick and abn CT Chest (see below).   Arthritis symptoms well controlled at present on Rx for RA rec Nexium 40 mg Take 30-60 min before first meal of the day and add pepcid 20 mg one at bedtime whenever coughing.     10/19/2017  f/u ov/Wert re:  RA lung dz  Chief Complaint  Patient presents with   Follow-up    Cough is much improved, but has not resolved yet. Cough is non prod. She has not had to use her neb.   Dyspnea:  Not limited by breathing from desired activities  But some doe x steps Cough: daytime > noct dry  Sleep: fine  SABA use:  No saba Medrol 4 mg a/w 2mg  per day/ ok control of arthritis  rec Start back on gabapentin up to 300 mg each am  in addition to the the two at bedtime  If not better increase the medrol to 8 mg daily until bettter then taper back to where you      01/10/2018  f/u ov/Wert re:  RA  Lung dz Medrol  4 mg  One alternating with a half Chief Complaint  Patient presents with   Follow-up    PFT's done. Her breathing has been gradually worsening since the last visit. She has occ cough- non prod.   Dyspnea gradually worse since last ov:  MMRC1 =  MMRC3 = can't walk 100 yards even at a slow pace at a flat grade s stopping due to sob    Gradually x 3 m / more fatigue / no change in  arthritis  Cough: not an issue rec Protonix 40 mg Take 30-60 min before first meal of the day  GERD diet   01/17/2018 acute extended ov/Wert re: cough on medrol 4 mg  One a/w one half  Chief Complaint  Patient presents with   Acute Visit    started coughing 01/11/18- occ prod with minimal green sputum.  She states also wheezing and having increased SOB.    abruptly worse 01/11/18 with severe 24/7 coughing >>  prod min green mucus esp in am/ assoc with subjective wheeze and did not follow previous contingencies re flutter / saba/ increase medrol and admits she does not rember those written instructions nor how to use the neb provided .  No fever/ comfortable at rest sitting  rec For cough > mucinex dm 1200 mg every 12 hours and cough into the flutter valve as much as possible  Doxycycline 100 mg twice daily x 10 days with glass of water Medrol 4mg  x 2 now and take 2  daily until cough is better then 1 daily x 5 days and then resume the previous dose  Shortness of breath/ wheezing/ still coughing >  albuterol neb every 4 hours as needed      01/20/2018 acute extended ov/Wert re: refractory cough and sob 01/11/18 Chief Complaint  Patient presents with   Acute Visit    she is not feeling better, coughing , very SOB, wheezing  mucus now clear/scant  on doxy/ neb machine not working (tube would not plug into the side s adequate force and she was not capable of applying it due to RA hands. Cough/ wheeze/ sob 24/7 / flutter not helping/ can't lie down at hs   rec While coughing protonix 40 mg Take 30- 60 min before your first and last meals of the day  Shortness of breath/ wheezing/ still coughing > albuterol neb every 4 hours as needed  Depomedrol 120 mg IM and medrol 32 mg daily x 2 days,  then 16 mg x 3 days,  Then 8 mg x 4 days , then resume the 4 mg daily  For severe cough > tylenol 3# one every 4 hours if needed  Go to ER if condition worsens on above plan       Date of admission: 01/22/2018              Date of discharge: 01/27/2018   History of present illness: As per the H and P dictated on admission, " Daniela Siebers  is a 83 y.o. female, w Rheumatoid arthritis, ILD Asthma, apparently c/o increase in dyspnea this evening. Dry cough.   Denies fever, chills, cp, palp,  N/v, diarrhea, brbpr, black stool.   Pt notes recently being given steroid injection in office as well as being placed on doxycycline. This might have helped slightly but pt worse  Hospital Course:  Summary of her active problems in the hospital is as following. 1 dyspnea/hypoxemia/ILD Concerned that likely GERD Is causing ILD. Patient with cough.   Patient with complaints of awakening with cough and also with oral intake which is slightly improved since 01/24/2018. assessed by speech therapy and speech therapy raising concern of esophageal component but no signs of aspiration.   2D echo with a EF of 55 to 60% with no wall motion abnormalities, grade 1 diastolic dysfunction.  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 Pulmonary felt that the patient should be on scheduled Reglan. I have placed the patient on scheduled potassium before sleep. Continue steroids on discharge continue Mucinex and Claritin as well as inhalers. Patient will follow-up with pulmonary outpatient   2.  Gastroesophageal reflux disease Continue PPI and H2 blocker.  I changed PPI to [facility].   3.  Rheumatoid arthritis Outpatient follow-up.     4.  Anxiety Continue Effexor.       All other chronic medical condition were stable during the hospitalization.  Patient was ambulatory without any assistance. On the day of the discharge the patient's vitals were stable , and no other acute medical condition were reported by patient. the patient was felt safe to be discharge at home with family.   Consultants: PCCM  Procedures: Echocardiogram       03/21/2018  f/u ov/Wert re:   S/p admit was transiently  better  and downhill since Labor day on medrol 4 mg daily  Last orencia on Sept 4th 2019  Chief Complaint  Patient presents with   Acute Visit    Per patient, she has had a dry cough since July 2019. She has been wheezing as well. Increased fatigue. Body aches. Denies any fever or chills.   Dyspnea:  MMRC4  = sob if tries to leave home or while getting dressed   Cough: harsh/ hacking mostly dry/ has flutter not using    SABA use: not much better with rx   No obvious day to day or daytime variability or assoc excess/ purulent sputum or mucus plugs or hemoptysis or cp or chest tightness, subjective wheeze or overt sinus or hb symptoms.     Also denies any obvious fluctuation of symptoms with weather or environmental changes or other aggravating or alleviating factors except as outlined above   No unusual exposure hx or h/o childhood pna/ asthma or knowledge of premature birth.   INpatient consult 03/26/18 83 year old with rheumatoid arthoritis.At baseline the patient lives at home with her husband and is independent of ADLs.  Has been on many immune suppressants over decades and curently on orencia x 4 year and prednisone. Does not recollect being on bactrim/dapsone. Chart mentions BOOP/MAI in 2001 but she denies this. 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 Got admitted. Rx with steroids. Per Notes - clnical suspicion of  arytenoid inflmmation related wheeze noticed (she also reports asthma NOS). Follolwup with ENT recommended (but not seen one as yet). She also appears to have passed swallow with rec for regular diet with thin liquids but did to have mild eso stricture and reflux during testing . PFT shows 10% FVC decline for first ime. CT chest at this tme (aug 2019) showed new rLL infiltrate.  ECHO July 2019 without evicence of elevated  PASP and saw cards [facility] June 2019 and was considered to have worsenin dyspnea due to [facility] issues (reports stress test at [facility] that was normal but I cannot see it)   She reports after discharge she got better but in last several weeks has deteriorated with cough and dyspnea. There is new hypoxemia (currently RA with nail polish and poor circulation  - 89% pulse ox) needing 2L [place]. Per [facility] improved with steroids and abx. CTA 03/24/2018 => shows that RLL inifltrate has improved . Other chronic ILD changes + and small  Hiatal hernia + witthout change.   Review of lab work does show eosinophilia at time of admision   EVENTS 03/21/18 - IgE -5, blood allegy panel - negative, 03/23/2018 - - admit . HIGH EOS 2300, ESR 48, BNP 89 , HIV neg 9/12- PCT negative, RVP negative 9/14 -  PCT < 0.1, Urine strep - negative, MRSA PCR - positive. IgE - normal 4, Blood allergy panel repeat - negative 03/26/2018 - leading consideration for airway (BO in RA +/- asthma) related flare either due to MRSA bronchitis or clinical suspicion of arytenoid inflmmation +/- GERD relatd flare (she has small hiatal hernia)  +/- ? Dysphagia  up causing mild hypoxemia acute resp failure, wheeze . Allergy and IgE blood work negative thought. Patient reports being better but says she is choking on drinkin water [facility] MD says wheeze improved significantly with steroids.  RN says was down to RA yesterday evening but needed 1L [place] at sleep. Today -Room air at rest 94% and desaturated to 86% walking 60 feet 03/27/2018  - better. Off o2 at rest. STill coughs with water and when lies down.  Husband at bedside. Both requesting ILD clinic followup . Desaturated t 79% walking 90 feet.   OV 04/12/2018  Subjective:  Patient ID: Nyra Market, female , DOB: Mar 01, 1941 , age 6 y.o. , MRN: [id number] , ADDRESS: [address] [place] [address]   04/12/2018 -   Chief Complaint  Patient presents with   Consult    Pt is a former MW pt.  Pt  denies any current complaints of cough, SOB, or CP but states the cough she originally had ended her up in the hosp 9/11-9/17 with dx acute respiratory failure. Pt does wear 2pulse with exertion and also wears 2L continuous when at home.     HPI NADEA KIRKLAND 83 y.o. -presents for follow-up to the ILD clinic.  She is known to have rheumatoid arthritis with ILD changes.  She had been followed by Dr. Sandrea Hughs.  However in July 2019 in September 2019 she has had 2 admissions to the hospital with respiratory distress and hypoxemic respiratory failure.  In the first 1 that seem to be right lower lobe infiltrate and then she improved from it but in the second 1 even though the right lower lobe infiltrates were better she still was hypoxemic.  Acid reflux and dysphagia was considered a possible etiology but she passed swallow study 2 times.  They thought she had some reflux.  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  At the same time it is not clear if the ILD is progressive based on pulmonary function testing below   At this point in time she tells me that she is getting home physical therapy.  Her fatigue is improving but it is not fully resolved.  She was discharged on continuous oxygen which she is using.  However she is feeling less short of breath.  Today in fact when we turned her oxygen off and walked her she did not desaturate and this is a significant improvement.  She is on monthly Orencia through the [facility] rheumatologist Dr. Rexene Agent.  At this point in time she is put the Orencia on hold.  She told me that she is been on Orencia for 4 years and never had a respiratory exacerbation still recently x 2.  Although before going on Orencia she had pneumonia while on Remicade and the Remicade.  In terms of her rheumatoid arthritis she hardly has any pain.  Her joint architecture is fairly well-preserved because of various immunomodulators over  time.  She says that she was on Remicade for years and when she stopped it for 8 weeks before the switch to Orencia she never really had a relapse in her rheumatoid arthritis.  She is largely pain and stiffness free.  She believes she can go without  her Orencia for a while.  Review of the literature shows greater than 10% chance of a respiratory infection especially COPD exacerbation.  Although the time frame for this is unclear.       OV 06/07/2018  Subjective:  Patient ID: Nyra Market, female , DOB: 10/29/1940 , age 32 y.o. , MRN: [id number] , ADDRESS: [address] [place] [address]   06/07/2018 -   Chief Complaint  Patient presents with   Follow-up    ILD, PFT done today, some wheezing and coughing but better tha before   Rheumatoid arthritis ILD and asthma/obstructive lung disease phenotype on Dulera  HPI ANH BIGOS 83 y.o. -presents for routine follow-up with her husband.  She is here to follow-up with [facility] Dr. Aundria Rud.  She plans to do this in  December 2019.  She continues to be off Orencia.  Her joints are slowly getting stiff again.  She believes that she will need to be back on immunosuppression agent again.  She currently continues Medrol 4 mg alternating with 2 mg.  This for her rheumatoid arthritis.  In terms of her joints she continues on Medrol 4 mg alternating with 2 mg but not on any other immunosuppression agent.  Overall she is been stable but for the last 2 weeks has had green sputum and wheezing and chest congestion and cough.  She recently visited her husband who was hospitalized and walking the long hallways at [facility] made a short of breath but she thinks this is probably baseline for her.  There are no other new issues.  She did have spirometry and DLCO and this shows a decline compared to September 2019 and a similar to July 2019.  It is documented below.  This is probably reflective of a flareup   OV  07/21/2018  Subjective:  Patient ID: Nyra Market, female , DOB: [birth year] , age 34 y.o. , MRN: [id number] , ADDRESS: [address] [place] [address]   07/21/2018 -   Chief Complaint  Patient presents with   Follow-up    Pt states she has been doing well since last visit. States she is about to begin Rituxan with [facility]. Pt still becomes SOB with exertion. Denies any complaints of cough or CP.   Rheumatoid arthritis ILD and asthma/obstructive lung disease phenotype on Dulera  HPI KATHREEN DILEO 83 y.o. -presents for follow-up of the above.  Last seen just before Thanksgiving 2019.  In the interim overall stable although on June 23, 2018 she climbs a steep flight of stairs which is unusual exertion for her and she became very dyspneic.  Following day saw Dr. Aundria Rud at [facility] rheumatology and was given Z-Pak and prednisone and started feeling better.  Although it is not fully clear to me she had fever and bronchitic symptoms.  I reviewed Dr. Aundria Rud note.  Dr. Aundria Rud is decided to start Rituxan for rheumatoid arthritis.  She is only having some minimal joint pain at this point.  She is off Orencia and continues to be off Orencia.  She did have some blood work with [place] before starting Rituxan.  She is due to see Dr. Aundria Rud within the next week and start her Rituxan.  Her liver function test July 18, 2017 is normal hemoglobin is normal.  CRP is also normal.  We did spirometry and walking desaturation test.  These show improvement compared to before and these are documented below.  Currently she not using nighttime or daytime oxygen.  She is wondering if she could switch rheumatology care to [facility].  This is because while she likes [employer] she is getting older and more frail and feels some body local would be of help.  I have sent a message to Dr. Corliss Skains inquiring.  Certainly we can help her with Rituxan infusions at [facility] if needed.  She  will check on this with her [facility] rheumatologist.       OV 03/27/2019  Subjective:  Patient ID: Nyra Market, female , DOB: Aug 25, 1940 , age 34 y.o. , MRN: [id number] , ADDRESS: [address] [place] [address]  Rheumatoid arthritis ILD and asthma/obstructive lung disease phenotype on [facility]   03/27/2019 -  Rourine fu   HPI JULITA OZBUN 83 y.o. -presents for  the above.  Last seen in January 2020.  After that she has seen Dr. Aundria Rud rheumatology at [facility].  She is now getting Rituxan 2 doses every 6 months.  She says this is helped her joints and her stiffness.  She is a little bit more mobile than usual.  However in terms of her respiratory status she continues to have episodic cough.  In June 2020 she again got hypoxemic and got admitted.  Since then she has had episodic cough.  She had a respiratory exacerbation in June 2020 for the admission she got steroids.  This seemed to help.  She is also on a higher dose Dulera right now.  In terms of her cough this seems to be her biggest problem.  She seems to be on Dulera, Singulair scheduled with also Tessalon and Delsym and DuoNeb.  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  Her last high-resolution CT scan of the chest was 1 year ago.  She says the dyspnea itself is not worse.  She is currently not using oxygen.  On exam she did have some wheezing.  Currently she has white and brown sputum but this is baseline.  In terms of a COVID wrist she has been tested recently couple of times and this is been negative.  She is isolating well.  She wanted to know COVID prevention activities and risk status and masking strategies.      OV 05/24/2019  Subjective:  Patient ID: Nyra Market, female , DOB: [name], 1940 , age 15 y.o. , MRN: [id number] , ADDRESS: [address] [place] [address]   05/24/2019 -   Chief  Complaint  Patient presents with   Follow-up    Pt was recently in the hosp due to ILD. Pt states that she has been better since being out of the hosp.   Rheumatoid arthritis ILD and asthma/obstructive lung disease phenotype on Dulera/Singulair  Post herpetic neuralgia - on elavil, gabapentin, effexor  HPI ALUEL SCHWARZ 83 y.o. -returns for follow-up.  At the last visit approximately 2 months ago she was reporting worsening cough following an admission in summer 2020.  Her pulmonary function test suggested worsening ILD status.  Therefore we requested a high-resolution CT chest which she did in October 2020.  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  However in the interim after the CT scan was done towards the end of October 2020 she developed worsening of her cough over 2 weeks and also associated shortness of breath but significantly the cough is much worse.  She ended up getting admitted to the hospital.  There was some hypoxemia.  By this time she had finished a ENT evaluation that did not show any involvement of the arytenoids.  Pulmonary was consulted.  She was given a prednisone burst which she just finished I believe yesterday.  She is back on her baseline Medrol.  And she is feeling better.  Her oxygen status is improved although she is using oxygen at night now.  She is really frustrated with these recurrent flareups and these admissions which ended up with her having a wheeze and also hypoxemia.  Currently she is on her baseline Medrol for rheumatoid arthritis associated with Dulera and Singulair.  She is also on losartan for blood pressure.  Her walking desaturation test is slightly worse than baseline.  She has a GI consult pending because of the recurrent episodes of cough and flareups.  We went over exposure history.  We used interstitial lung disease questionnaire for the exposure history.  Specifically she denies any electronic cigarette use of  marijuana use of cocaine use or any IV drug abuse.  [place] last 14 years.  Asked extensive questions about the home environment it is positive for nebulizer use but the nebulizer does not have mildew or mold in it.  Otherwise no organic antigen exposure.  The house is not damp.  There is no mold or mildew in the shower curtain.  There is no humidifier use no steam iron use.  No Jacuzzi use.  No misting [place] outside to inside the house.  No pet birds.  No pet gerbils no feather pillows.  There is no mold in the [facility] duct.  She does not do any gardening.  Does not use wind instruments.  Also 122 question occupational history elicited and essentially negative.  The other issue is that she has polypharmacy.  She is asking for my help in reducing her medications.  She is on 3 medications for postherpetic neuralgia.  She is on losartan      OV 06/21/2019  Subjective:  Patient ID: Nyra Market, female , DOB: [birth year] , age 40 y.o. , MRN: [id number] , ADDRESS: [address] [place] [address]   06/21/2019 -   Chief Complaint  Patient presents with   televisit    hosp 11/29-12/4 due to covid with pna. pt said that she is doing okay after recent hosp but states she has no energy.      LAYTON TAPPAN 83 y.o. - last visit 05/24/2019. Diagnosed with covid-19 on 05/31/2019. Admitted 06/11/2019  - 06/16/2019. Quarantine ends 06/21/2019 today. Rx with Remdesviri and Decadron. Husband also on phone. Questions  1. Quarantine ends - from 06/22/19\ and she is not contagious and likely resistant to reinfection to covid for another few months  2. She is on 10 day dexamethasone for covid - today is last day for it  3. She is on dulera. Hospital gave combivent and she does not want do combivent - this is fine  4. Ofev for ILD - not started it yet . She had questions about side effects. Explained GI and LFT monirtong. SHe wanted to  wait till Jan 2021 and start it . I am ok with that.   5. Small hiat   IMPRESSION: HRCT OCt 2020 1. 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 Findings have progressed since 2017 and 2019 high-resolution chest CT studies. 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 Findings are consistent with UIP per consensus guidelines: Diagnosis of Idiopathic Pulmonary Fibrosis: An Official ATS/ERS/JRS/ALAT Clinical Practice Guideline. Am Rosezetta Schlatter Crit Care Med Vol 198, Iss 5, [id number], Mar 13 2017. 2. 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 4. Small hiatal hernia.   Aortic Atherosclerosis (ICD10-I70.0).     Electronically Signed   By: Delbert Phenix M.D.   On: 04/24/2019 13:29    OV 08/03/2019 - face to face visit  Subjective:  Patient ID: Nyra Market, female , DOB: 1941/04/21 , age 39 y.o. , MRN: [id number] , ADDRESS: [address] [place] [address]    Rheumatoid arthritis with Joint s/ILD- Rituxan/steroids ([facility])  - progressive phenotype (CT 2017/2019 -> Oct 2020). Planned Ofev start in Nov 2020 but did not start as of dec 2020. LFT normal Jan 2021  Athma/obstructive lung disease phenotype on Dulera/Singulair  Post herpetic neuralgia - on elavil, gabapentin, effexor  Coronary Artery Calcification - 1 vessel - cardiology referral done  Diagnosed with covid-19 on 05/31/2019. Admitted 06/11/2019  - 06/16/2019. Rx with Remdesviri and Decadron.  Small Hiatal hernia onCT Oct 2020  Rib fracture from fall Dec 2020: Musculoskeletal: There is nondisplaced fractures of the posterior left fifth, 6, 8, 9, and tenth left ribs. There is unchanged slight superior compression deformities of the T5, T6, T10 vertebral bodies with less than 25% loss in height.Associated pulmnary contusion  +    HPI EMORIE MCFATE 83 y.o. -presents for a visit. Her nintedanib has been delayed because of COVID-19. Also subsequently a fall. She saw a nurse practitioner on July 20, 2019 and they took a shared decision to start nintedanib. She had a ? telephone visit with Dr. Kennieth Francois the rheumatologist at [facility]. Patient scheduled for her next Rituxan in February 2021 but reviewed the notes indicates this could be pushed to early spring 2021. Dr. Aundria Rud is okay with the patient study got intubated at this point in time.  Patient has follow-up with Dr. Aundria Rud tomorrow at [facility].  At this face-to-face visit she is here with her husband.  She says she is much better after the Covid and also the fall that fractured her ribs.  Nevertheless all this is left her fatigue.  Infective fatigue scores are much worse.  Also in the last 2 weeks has had increased cough wheezing and shortness of breath.  The sputum was also changed color in the last 1 week to clear green.  This is all new.  Her symptom score is therefore a worse than her baseline.  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  She is only taking 100 mg once a day.  The plan was to go up to 100 mg twice a day next week.  This is a minimum effective dose.  The maximum dose is 150 mg twice a day.  Given her age and comorbidities we are starting at a low dose.  Part of this visit is to make sure that she is tolerating the drug fine.  And so far she is.  She is interested in the ILD-pro registry study done by the [facility] clinical research Institute.   She is working with physical therapy for her fatigue.  Ambulatory Walk 07/20/2019 2 Lap- O2 92% RA; HR 109 No shortness of breath She did not need to stop Used walker    OV 09/20/2019  Subjective:  Patient ID: Nyra Market, female , DOB: 06/04/1941 , age 64 y.o. , MRN: [id number] , ADDRESS: [address] [place]  [place] [address]   09/20/2019 -   Chief Complaint  Patient presents with   Televisit    Called and spoke with pt who stated she has been feeling okay since last visit. Pt stated she is still taking OFEV and denies any complaints. Pt states she believes her breathing is stable at this point.    Rheumatoid arthritis with Joint s/ILD- Rituxan/steroids (duke uinviesty)  - progressive phenotype (CT 2017/2019 -> Oct 2020). Planned Ofev start in Nov 2020 but did not start as of dec 2020. 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 Admitted 06/11/2019  - 06/16/2019. Rx with Remdesviri and Decadron.  Small Hiatal hernia on CT Oct 2020  Rib fracture from fall Dec 2020: Musculoskeletal: There is nondisplaced fractures of the posterior left fifth, 6, 8, 9, and tenth left ribs. There is unchanged slight superior compression deformities of the T5, T6, T10 vertebral bodies with less than 25% loss in height.Associated pulmnary contusion +   HPI SHAYA REDDICK 83 y.o. - has 2nd covid vaccine later this week. Rituxan is end of the month. Baseline RA regiment is On dulera for associated obstructio of lung. Continue ofev 100mg  bid since mid jan 2021 for ILD. No side effects. Did see Buelah Manis for face to face visit early feb 2021 -> for bronchitis and felt better after prdnisone and zpak. Currently on medrol   8 mg day and staying there per Buelah Manis . Wants t oknow if she can taper. Wants to know how she can prevent care flare ups. Explained ofev, masking and social distancing  prevent respiratory infection.Denies choking on food or aspirating. Denies mold in house - relatively news. Denies dog. Denies cat. Denies carious teeth. Denies post nasal drip  OVerll feels better and improved dyspnea.     OV 12/18/2019  Subjective:  Patient ID: Nyra Market, female , DOB: Apr 07, 1941 , age 68 y.o. , MRN: [id number] , ADDRESS: [address] [place] [address]  PCP [name], MD Rheumatologist-Dr. Rexene Agent at [facility] Pulmonary/ILD: Dr. Jeni Salles  12/18/2019 -   Chief Complaint  Patient presents with   Follow-up    no worse   Rheumatoid arthritis with Joint s/ILD- Rituxan/steroids (duke uinviesty)  - progressive phenotype (CT 2017/2019 -> Oct 2020). Planned Ofev start in Nov 2020 but did not start as of dec 2020. 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 Admitted 06/11/2019  - 06/16/2019. Rx with Remdesviri and Decadron.  Small Hiatal hernia on CT Oct 2020  Rib fracture from fall Dec 2020: Musculoskeletal: There is nondisplaced fractures of the posterior left fifth, 6, 8, 9, and tenth left ribs. There is unchanged slight superior compression deformities of the T5, T6, T10 vertebral bodies with less than 25% loss in height.Associated pulmnary contusion +  -On Reclast once a year through Dr. Rexene Agent   HPI Nyra Market 83 y.o. 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  It has been a few month since I last saw her.  In this time she did not taper her Medrol down.   She stated 8 mg/day.  She was  originally taking the Medrol for her rheumatoid arthritis for many years.  She recently increased the dose from 4 mg / 2 mg every other day to 8 mg daily because of recurrent respiratory exacerbations.  She is asking for [facility] refills for obstructive lung disease.  She is getting Rituxan through Dr. Rexene Agent for rheumatoid arthritis.  Her next Rituxan is in September 2021.  She saw Dr. Aundria Rud in April 2021 and reviewed the note.  Her liver function test at the time was fine.  In terms of her ILD she continues on nintedanib 100 mg twice daily.  She says she has no side effects from the drug she is tolerating it quite well.  We discussed about increasing the dose but she wants to hold off because she just got a new supply.  However she is open to increasing the dose when the supply runs out.  She is due for liver function test today.  In terms of overall symptoms things have improved as can be seen below and the symptom score.Marland Kitchen  However her main concern is that of fatigue.  She is open to attending pulmonary rehabilitation.  She is not using oxygen at home.  Sometime back when we tested overnight oxygen desaturation test she did not desaturate.  Walking desaturation test today stable.  She does notice of note that her blood pressure has gone up.  She is working with her primary care physician on this.  She is on losartan.    OV 04/10/2020   Subjective:  Patient ID: Nyra Market, female , DOB: 1940/12/11, age 46 y.o. years. , MRN: [id number],  ADDRESS: [address] [place] [address] PCP  [name], MD Providers : Treatment Team:  Attending Provider: Kalman Shan, MD   Chief Complaint  Patient presents with   Follow-up    CT scan 8/23, has not increased Ofev, shortness of breath with exertion.    Rheumatoid arthritis with Joint s/ILD- Rituxan/steroids (duke uinviesty)  - progressive phenotype (CT 2017/2019 -> Oct 2020). Planned Ofev  start in Nov 2020 but did not start as of dec 2020. 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 Admitted 06/11/2019  - 06/16/2019. Rx with Remdesviri and Decadron.  Admitted August 2021 for respiratory infection not otherwise specified.  Following trip to [place].  Was hypoxemic.  Small Hiatal hernia on CT Oct 2020  Rib fracture from fall Dec 2020: Musculoskeletal: There is nondisplaced fractures of the posterior left fifth, 6, 8, 9, and tenth left ribs. There is unchanged slight superior compression deformities of the T5, T6, T10 vertebral bodies with less than 25% loss in height.Associated pulmnary contusion +  -On Reclast once a year through Dr. Rexene Agent    HPI Nyra Market 83 y.o. -returns for follow-up.  Last week by myself early June 2021.  Then after that in early August 2020 when she got admitted for respiratory infection not otherwise specified.  She was hypoxemic on room  air.  This followed a trip to [place].  In the follow-up phase end of August 2020 when she had high-resolution CT chest that showed increased groundglass opacities but when compared to a CT from 8 months ago.  Also chronic ILD had worsened.  I personally visualized the image at this point in time she is improved from this admission she is not using oxygen at all.  In fact walking desaturation test shows near baseline.  Nevertheless overall her symptom scores have declined over time ILD symptom score is listed  below.  She is really concerned about several issues.  In terms of her ILD she was inquiring about escalating to 150 mg twice daily.  Even at 100 mg twice daily she is significantly symptomatic in terms of nonrespiratory issues such as nausea and dizziness and fatigue.  I did caution her that these could all get worse.  Therefore she is just opted to be at 100 mg twice daily  -Recurrent admissions for respiratory issues [I did address that some of this was Covid and fall related and not necessarily her usual summer exacerbation].  Nevertheless, I asked about exposures at home.  She does not have a feather blanket or feather pillow or feather jacket.  She denies any mold or bird exposure or mildew exposure at home.  Does not do any gardening or wind instruments.  She is immunosuppressed and I did notice that she is not on Bactrim for PCP prophylaxis.  In 2019 when we checked G6PD the lab could not process this lab.  She is interested in Bactrim prophylaxis.  She is interested in okay getting G6PD rechecked   -She is dealing with significant amount of dizziness.  Neurology is sending her to neuro rehab.  It is believed it is multifactorial.  I reviewed her recent CT head angio report.  I personally visualized the image   -She is also worried about cough.  Currently cough is tolerable but she says prior to her exacerbations cough gets worse.  We did discuss with her that if she is aspirating which she denied.  We did discuss about the possibility of starting Bactrim and monitoring her cough and she is fine with that.  I did review old records Lungs/Pleura: Biapical pleuroparenchymal scarring. Patchy and largely peripheral peribronchovascular ground-glass, increased from 07/03/2019. Underlying subpleural reticulation, traction bronchiectasis/bronchiolectasis and scattered honeycombing, similar to minimally increased from 07/03/2019. Calcified granulomas. No pleural fluid. Airway is unremarkable. No air  trapping.   Upper Abdomen: Visualized portions of the liver, adrenal glands, kidneys, spleen, pancreas, stomach and bowel are unremarkable with the exception of a small hiatal hernia. Cholecystectomy. Calcified upper abdominal lymph nodes.   Musculoskeletal: Degenerative changes in the spine. No worrisome lytic or sclerotic lesions.   IMPRESSION: 1. Increased patchy pulmonary parenchymal ground-glass may be due to an atypical/viral pneumonia, including due to COVID-19. 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 2. Aortic atherosclerosis (ICD10-I70.0). Coronary artery calcification.     Electronically Signed   By: Leanna Battles M.D.   On: 03/04/2020 12:58    OV 08/01/2020  Subjective:  Patient ID: Nyra Market, female , DOB: April 21, 1941 , age 14 y.o. , MRN: [id number] , ADDRESS: [address] [place] [address] PCP [name], MD Patient Care Team: [name], MD as PCP - General (Internal Medicine) Jodelle Red, MD as PCP - Cardiology (Cardiology)  This Provider for this visit: Treatment Team:  Attending Provider: Kalman Shan, MD   Rheumatoid arthritis with Joint s/ILD- Rituxan/steroids (duke uinviesty)  - progressive phenotype (CT 2017/2019 -> Oct 2020). Planned Ofev start in Nov 2020 but did not start as of dec 2020. 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 Admitted 06/11/2019  - 06/16/2019. Rx with Remdesviri and Decadron.  Admitted August 2021 for respiratory infection not otherwise specified.  Following trip to [place].  Was hypoxemic.  Small Hiatal hernia on CT Oct 2020  Rib fracture from fall Dec 2020: Musculoskeletal: There is nondisplaced fractures of the posterior left fifth, 6, 8, 9, and tenth left ribs. There is unchanged slight superior compression deformities of the T5, T6, T10 vertebral bodies with less than 25% loss in height.Associated pulmnary contusion +  -On Reclast once a year through Dr. Rexene Agent    08/01/2020 -   Chief Complaint  Patient presents with   Follow-up    Doing well     HPI AKYLA VAVREK 83 y.o. -returns for follow-up.  She says since last visit she has lost weight.  In fact tracking her weight it appears she is definitely lost weight.  She has occasional intermittent nausea once every few weeks this is because she does not time her nintedanib well with food.  She has a low appetite as well.  She tried to go up on the Internet but she is unable to.  She takes a low-dose 100 mg twice daily.  She is on Rituxan and prednisone.  She is up-to-date with her COVID-vaccine.  Current shortness of breath standpoint she is stable.  Symptoms are stable.  CT Chest data  OV 10/22/2020  Subjective:  Patient ID: Nyra Market, female , DOB: 1941/01/03 , age 47 y.o. , MRN: [id number] , ADDRESS: [address] [place] [address] PCP [name], MD Patient Care Team: [name], MD as PCP - General (Internal Medicine) Jodelle Red, MD as PCP - Cardiology (Cardiology)  This Provider for this visit: Treatment Team:  Attending Provider: Kalman Shan, MD    10/22/2020 -   Chief Complaint  Patient presents with   Follow-up    Still having shortness of breath with activity, denies  increase in severity.     Rheumatoid arthritis with Joint s/ILD- Rituxan/steroids (duke uinviesty)  - progressive phenotype (CT 2017/2019 -> Oct 2020). Planned Ofev start in Nov 2020 but did not start as of dec 2020. 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 Admitted 06/11/2019  - 06/16/2019. Rx with Remdesviri and Decadron.  - sp evushed x 2  Admitted August 2021 for respiratory infection not otherwise specified.  Following trip to [place].  Was hypoxemic.  Small Hiatal hernia on CT Oct 2020  Rib fracture from fall Dec 2020: Musculoskeletal: There is nondisplaced fractures of the posterior left fifth, 6, 8, 9, and tenth left ribs. There is unchanged slight superior compression deformities of the T5, T6, T10 vertebral bodies with less than 25% loss in height.Associated pulmnary contusion +  -On Reclast once a year through Dr. Rexene Agent  Normal ECHO Summer 2020   HPI CARMELINA BALDUCCI 83 y.o. -returns for follow-up.  At this point in time for her ILD she is taking nintedanib 100 mg twice daily.  Last liver function test was in December 2021.  She is now attending pulmonary rehabilitation.  She is not sure it is helping her.  She tells me that she does not desaturate at pulmonary rehabilitation so she not using her oxygen.  Review of the records indicate same but it appears that today her dyspnea is worse in terms of symptom score although she is denying that.  Walking desaturation  test in the office today with a forehead probe suggest that pulse ox is declined.  Her pulmonary function test also shows 5% FVC decline.  She was surprised by this.  In talking to her realize that for coexistent obstructive lung disease she is no longer taking Dulera.  She is willing to take inhaler which she will have to twist instead of having to pump with her fingers because she has rheumatoid arthritis.  She is no longer losing weight though.  She had questions about taking monoclonal antibody prophylaxis.  She is under the impression she needs to take it every 6 months with Rituxan dosing.  I did explain to her that in the presence of Rituxan the body is not able to respond actively to vaccine and that is why she is having prophylactic antibody.  She is now had 2 doses the last 1 being a few weeks ago first 1 was in January 2022.  I have written to the [facility] coordinator Lillard Anes 27 monoclonal antibody prophylaxis schedule.  Expressed to patient that because of monoclonal antibody prophylaxis she did not do the vaccine.  Last echocardiogram was in summer 2020.      OV 12/23/2020  Subjective:  Patient ID: Nyra Market, female , DOB: 10-Feb-1941 , age 23 y.o. , MRN: [id number] , ADDRESS: [name] [place] [address] PCP [name], MD Patient Care Team: [name], MD as PCP - General (Internal Medicine) Jodelle Red, MD as PCP - Cardiology (Cardiology)  This Provider for this visit: Treatment Team:  Attending Provider: Kalman Shan, MD   12/23/2020 -   Chief Complaint  Patient presents with   Follow-up    PFT performed today. Pt states she has been doing okay since last visit and denies any complaints.     HPI ZAILA CREW 83 y.o. -returns for follow-up.  There is concern for progression.  Last visit we have made sure she added Breo.  She says she feels better.  In fact symptom score is better.  However pulmonary function test shows  continued decline.  She is on the low-dose nintedanib.  She says if she does not take the nintedanib exactly with food and even if  she takes it 15 to 20 minutes later she will have nausea.  Today she had nausea but otherwise if she is diligent she can maintain.  I informed her the pulmonary function test shows continued decline.  She is on Rituxan prednisone and low-dose nintedanib.  She initially said she will not be able to tolerate the higher dose nintedanib but after realizing that her nausea is mild and her weight loss is stable she says she is willing to give the higher dose nintedanib and attend.  I was able to get her some donor sample of nintedanib.  The lot number of this is [id number].  Expiration date is November 2023.  The serial number is [id number].  The GT IN number is [id number]       ECHO 11/3120  IMPRESSIONS     1. Left ventricular ejection fraction, by estimation, is 65 to 70%. The  left ventricle has normal function. The left ventricle has no regional  wall motion abnormalities. Left ventricular diastolic parameters are  consistent with Grade I diastolic  dysfunction (impaired relaxation). Elevated left ventricular end-diastolic  pressure. The E/e' is 76. The average left ventricular global longitudinal  strain is -18.4 %.   2. Right ventricular systolic function is normal. The right ventricular  size is normal. There is mildly elevated pulmonary artery systolic  pressure. The estimated right ventricular systolic pressure is 36.9 mmHg.   3. The mitral valve is abnormal. Mild mitral valve regurgitation.   4. The aortic valve is tricuspid. Aortic valve regurgitation is not  visualized. Mild aortic valve sclerosis is present, with no evidence of  aortic valve stenosis.   5. The inferior vena cava is normal in size with greater than 50%  respiratory variability, suggesting right atrial pressure of 3 mmHg.   Comparison(s): Changes from prior study are noted. 12/18/18  EF 60-65%. PA  pressure .    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  OV 03/10/2021  Subjective:  Patient ID: Nyra Market, female , DOB: [birth year] , age 78 y.o. , MRN: [id number] , ADDRESS: [address] [place] [address] PCP [name], MD Patient Care Team: [name], MD as PCP - General (Internal Medicine) Jodelle Red, MD as PCP - Cardiology (Cardiology)  This Provider for this visit: Treatment Team:  Attending Provider: Kalman Shan, MD    03/10/2021 -   Chief Complaint  Patient presents with   Follow-up    Pt states she has questions about ofev she currently on it but having side effects.    Rheumatoid arthritis with Joint s/ILD- Rituxan/steroids (duke uinviesty)  - progressive phenotype (CT 2017/2019 -> Oct 2020). Planned Ofev start in Nov 2020 but did not start as of dec 2020. 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 Admitted 06/11/2019  - 06/16/2019. Rx with Remdesviri and Decadron.  - sp evushed x 2  - 2nd covid by hx June 2022 - evusheld repeat Oct 2022  Admitted August  2021 for respiratory infection not otherwise specified.  Following trip to [place].  Was hypoxemic.  Small Hiatal hernia on CT Oct 2020  Rib fracture from fall Dec 2020: Musculoskeletal: There is nondisplaced fractures of the posterior left fifth, 6, 8, 9, and tenth left ribs. There is unchanged slight superior compression deformities of the T5, T6, T10 vertebral bodies with less than 25% loss in height.Associated pulmnary contusion +  -On Reclast once a year through Dr. 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  -returns for follow-up.  She presents with her husband Park Breed.  Last visit she wanted to increase the nintedanib to 150 mg twice daily but this is made her have more nausea and vomiting.  The diarrhea is very mild.  She then reduced her nintedanib 200 mg twice daily but still having side effects.  She is now started losing weight again.  She is really frustrated by this.  Her husband asked about pirfenidone.  Explained that the research with progressive pulmonary fibrosis is limited in the case of pirfenidone so nintedanib is the first choice.  Did agree that if nintedanib did not work we would try pirfenidone.  She wanted know what her options were.  We discussed about stopping nintedanib and improving quality of life but face the risk of continued progression pulmonary fibrosis.  She was not that enthused about this choice.  We discussed about trying Zofran with the nintedanib.  She seemed more receptive to this idea.  We decided that we will try with 100 mg twice daily at the lower dose and then see.  She believes asthma is under control.  Her pulmonary function test shown below seems to fluctuate.  Overall symptom severity stable.  She wanted a handicap placard signed today which I did.       OV 04/08/2021  Subjective:  Patient ID: Nyra Market, female , DOB: 18-Mar-1941 , age 63 y.o. , MRN: [id number] ,  ADDRESS: [address] [place] [address] PCP [name], MD Patient Care Team: [name], MD as PCP - General (Internal Medicine) Jodelle Red, MD as PCP - Cardiology (Cardiology)  This Provider for this visit: Treatment Team:  Attending Provider: Kalman Shan, MD    04/08/2021 -   Chief Complaint  Patient presents with   Follow-up    Pt states she has been doing okay since last visit and denies any real complaints. States breathing has been doing okay and states the nausea went away.    Rheumatoid arthritis with Joint s/ILD- Rituxan/steroids ([facility])  - progressive phenotype (CT 2017/2019 -> Oct 2020). Planned Ofev start in Nov 2020 but did not start as of dec 2020. 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 Admitted 06/11/2019  - 06/16/2019. Rx with Remdesviri and Decadron.  - sp evushed x 2  Admitted August 2021 for respiratory infection not otherwise specified.  Following trip to [place].  Was hypoxemic.  Small Hiatal hernia on CT Oct 2020  Rib fracture from fall Dec 2020: Musculoskeletal: There is nondisplaced fractures of the posterior left fifth, 6, 8, 9, and tenth left ribs. There is unchanged slight superior compression deformities of the T5, T6, T10 vertebral bodies with less than 25% loss in height.Associated pulmnary contusion +  -On Reclast  once a year through Dr. Rexene Agent  Normal ECHO Summer 2020 -> possible [facility] May 2021 RSVP 30s with GR1 ddx  HPI TAMEKO HALDER 83 y.o. 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  Last seen 4 weeks ago.  At that time she was losing weight and having significant GI side effects with nintedanib full dose.  Told her to reduce the nintedanib dose to 100 mg twice daily and also take Zofran as needed.  She is taking Zofran as needed.  She is tolerating the nintedanib low-dose much better.  Symptom scores are listed below.  Overall she feels better.  Weight loss is stabilized.  Infectious gained 1 pound of weight.  She feels that she will just take with nintedanib 100 mg twice daily using Zofran as needed.  She does not want to change the schedule.  I fully agree with her and supported on that.  She is aware that the low-dose is less effective but this might be best balance between risk and benefit.  She did ask about getting a COVID by Valent mRNA booster 2 oh micron and BA.5.  According to history she has had COVID x2.  Most recently was in June 2022 which would be against the current viral strain.  In addition she is got monoclonal antibody prophylaxis euvsheld in past.  She does socially isolate herself to the extent possible and masks.  She is going to get the monoclonal antibody prophylaxis again in a few weeks when she gets her Rituxan.  We did discuss the fact that because she is on Rituxan she is immunosuppressed and does not make antibodies and then the facet antibody transfer that she gets through [facility] is risk protective.  Additional risk protective feature is the fact that she had a current strain of COVID-19 and potentially has natural immunity.  Therefore it is her call to get the bivaet booster although she wanted to avoid it and I supported this in a shared decision making she could hold off till at least January 2023 when it  will be 6 months since her natural infection.     OV 07/18/2021  Subjective:  Patient ID: Nyra Market, female , DOB: 03/27/1941 , age 83 y.o. , MRN: [id number] , ADDRESS: [address] [place] [address] PCP [name], MD Patient Care Team: [name], MD as PCP - General (Internal Medicine) Jodelle Red, MD as PCP - Cardiology (Cardiology)  This Provider for this visit: Treatment Team:  Attending Provider: Kalman Shan, MD    07/18/2021 -   Chief Complaint  Patient presents with   Follow-up    Pt states she has been doing okay since last visit. States  her breathing is about the same.    Rheumatoid arthritis with Joint s/ILD- Rituxan/steroids ([facility])  - progressive phenotype (CT 2017/2019 -> Oct 2020). Planned Ofev start in Nov 2020 but did not start as of dec 2020. 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 Admitted 06/11/2019  - 06/16/2019. Rx with Remdesviri and Decadron.  - sp evushed x 2  Admitted August 2021 for respiratory infection not otherwise specified.  Following trip to [place].  Was hypoxemic.  Small Hiatal hernia on CT Oct 2020  Rib fracture from fall Dec 2020: Musculoskeletal: There is nondisplaced fractures of the posterior left fifth, 6, 8, 9, and tenth left ribs. There is unchanged slight superior compression deformities of the  T5, T6, T10 vertebral bodies with less than 25% loss in height.Associated pulmnary contusion +  -On Reclast once a year through Dr. Rexene Agent  Normal ECHO Summer 2020 -> possible [facility] May 2021 RSVP 30s with GR1 ddx   xxxx HPI KYLINN SHROPSHIRE 83 y.o. -presents for follow-up.  Last visit September 2022.  Since then she is doing stable.  With a lower dose of nintedanib no more diarrhea.  Weight loss is stopped.  Barely any nausea.  She is not needing Zofran for nausea control.  Her main concern right now is that she is having imbalance issues.  She is stopped working with the physical therapy.  She thinks it is 1 of proprioception but also long-term muscle loss and sarcopenia.  I have advised her to take this with her primary care/rheumatology.  Terms of asthma is stable on Breo and Singulair.  Last liver function test April 2022 in our chart.  Latest pulmonary function test shows stability    CT Chest data  No results found.            OV 03/06/2022  Subjective:  Patient ID: Nyra Market, female , DOB: 23-Nov-1940 , age 42 y.o. , MRN: [id number] , ADDRESS: [address] [place] [address] PCP [name], MD Patient Care Team: [name], MD as PCP - General (Internal Medicine) Jodelle Red, MD as PCP - Cardiology (Cardiology)  This Provider for this visit: Treatment Team:  Attending Provider: Kalman Shan, MD    03/06/2022 -   Chief Complaint  Patient presents with   Follow-up    PFT performed today.  Pt states she has been doing okay since last visit. States her breathing has been doing okay but states she has been having some problems with balance.    HPI EBONI COVAL 83 y.o. -returns for follow-up.  Last seen in January 2023.  She says that from a breathing standpoint she is stable but she has had balance issues.  She has had 2 accidental falls.  The first 1 was in March 2023 while entering the house along the steps and she  landed in the bushes and had 25 stitches to her left lower extremity.  She also had collarbone fracture.  Then again a fall in the driveway 2 months ago but with just bruising.  She is now going to undergo physical therapy.  ENT evaluation did not show any ENT causes for the fall.  In terms of her dyspnea and walking desaturation test in the office she is stable but her pulmonary function test shows continued decline.  She is on nintedanib at the lower dose because she is not able to tolerate the higher dose.  On this lower dose she still requiring Zofran for control of nausea.  She takes Zofran preemptively.  There are no other issues.  The last echo and CT scan was over a year ago.     OV 12/11/2022  Subjective:  Patient ID: Nyra Market, female , DOB: July 16, 1940 , age 56 y.o. , MRN: [id number] , ADDRESS: [place] [address] [place] [address] PCP Eloisa Northern, MD Patient Care Team: Eloisa Northern, MD as PCP - General (Internal Medicine) Jodelle Red, MD as PCP - Cardiology (Cardiology)  This Provider for this visit: Treatment Team:  Attending Provider: Kalman Shan, MD    12/11/2022 -   Chief Complaint  Patient presents with   Follow-up    F/up, in the hosp. 3 weeks ago, and wants to know why she was in the hosp.     HPI JENNALEE GREAVES 83 y.o. -was last seen in August 2023.  After that her husband has passed away 2022-09-12 following motor vehicle accident when he was crossing the road.  .  Then she was admitted for 3 days 11/17/2022 through 11/20/2022 with nausea and vomiting.  She was also diagnosed with pneumonia.  The hospitalist contacted me.  Out of caution advised him to hold off giving any nintedanib she did receive IV community-acquired pneumonia therapy.  Review of the labs indicate she did not suffer from acute kidney injury.  Her discharge sodium was slightly low at 134.  Blood cultures negative.She had CT abdomen and pelvis 11/11/2022 that  did not show any acute process in the abdomen. She was discharged to SNF.  She tells me that at baseline she was taking Zofran once daily with some mild nintedanib induced nausea but the onset of nausea for this admission was quite abrupt and severe.  She does not know the cause.  After discharge from the SNF 2 weeks ago and now for the last 10 days she is back taking nintedanib.  She is having some mild drug-induced nausea and she says she is able to combat this with taking Zofran twice daily.  At this point in time she prefers to take Zofran along with the nintedanib and see what happens.   For a respiratory standpoint she is frustrated she not been able to get a pulmonary function test since August 2023.  Her ill health and also PFT machine not working have delayed this.  Subjectively she is feeling the same and clinically she feels stable from an ILD standpoint.  Therefore I suspect it is stable.   OV 03/02/2023  Subjective:  Patient ID: Nyra Market, female , DOB: 12/31/1940 , age 42 y.o. , MRN: [id number] , ADDRESS: [address] [place] [address] PCP [name], MD Patient Care Team: [name], MD as PCP - General (Internal Medicine) Jodelle Red, MD as PCP - Cardiology (Cardiology)  This Provider for this visit: Treatment  Team:  Attending Provider: Kalman Shan, MD  2    03/02/2023 -   Chief Complaint  Patient presents with   Follow-up    F/up on PFT     HPI WYNONIA MEDERO 83 y.o. -returns for follow-up.  She at this point is tolerating her nintedanib at 100 mg twice daily but she is increased her Zofran.  This visit is to see her pulmonary function test.  In May 2024 she did have CT abdomen the lung images and that she did show progressive ILD compared to 2020 07/2020.  Her pulmonary function test during this time had declined but today the FVC is improved while the DLCO is worse.  I think overall this will be stable compared to a year ago  but directionally she does have progressive phenotype.  Her last liver function test was in May 2024.  She will have repeat liver function test today.  She also will participate in the ILD-Pro research registry protocol.  I did discuss with her about inhaled pirfenidone versus placebo in a clinical trial development protocol.  She is interested in this and I gave her a copy of the consent.     OV 08/26/2023  Subjective:  Patient ID: Nyra Market, female , DOB: [birth year] , age 10 y.o. , MRN: [id number] , ADDRESS: [place] [address] [place] [address] PCP Eloisa Northern, MD Patient Care Team: Eloisa Northern, MD as PCP - General (Internal Medicine) Jodelle Red, MD as PCP - Cardiology (Cardiology)  This Provider for this visit: Treatment Team:  Attending Provider: Kalman Shan, MD    Nintedanib/Ofev requires intensive drug monitoring due to high concerns for Adverse effects of , including  Drug Induced Liver Injury, significant GI side effects that include but not limited to Diarrhea, Nausea, Vomiting,  and other system side effects that include Fatigue,  weight loss. Cardiac side effects are a black box warning as well. These will be monitored with  blood work such as LFT initially once a month for 6 months and then quarterly   08/26/2023 -   Chief Complaint  Patient presents with   Follow-up    ILD F/U      HPI KARREN NEWLAND 83 y.o. -returns for follow-up.  She is now relocated to a house near the office.  This is because she became a widow.  However shortly after moving to the house she picked up the influenza.  This was early February 2025 this year this month.  She was hospitalized in mild respiratory failure and 1 day of acute kidney injury and some dehydration.  Since then she has been admitted to skilled nursing facility rehabilitation.  She needs 1 more week of this before discharge.  However she continues nintedanib for her ILD and it is  stable.  She also maintains a every 60-month Rituxan.  The last dose was in August 2024.  The next dose is due 08/30/2023 at [facility].  She says now being a widow and living alone and also being deconditioned after influenza she is wondering if she can get her Rituxan locally.  I did indicate to her that our office has an adjacent infusion center run by [facility] health medical [facility].  She was delighted to hear this because there is only 1 mile from her house.  Therefore I have corresponded with Dr. Kennieth Francois at [facility] and also pharmacy team to see if Rituxan is still indicated and if so the dosage.  Waiting to hear on that.  Given all this she wants to hold off on in participating on any recent study.  On exam she did have crackles and squeaks but she does not feel she is in an exacerbation.  She is due for an ILD-pro registry visit today.   Last CT scan of the chest was in 2021 Last pulmonary function test was in August 2024   Of note she tells me that for the last few months she has had pedal edema and this been no workup.  Hospital lab review does not show she has had a BNP or echo.  Last echo was in 2022.   For this visit external records from the hospital were reviewed.  Also communication was established with other providers.  She is on    OV 09/09/2023  Subjective:  Patient ID: Nyra Market, female , DOB: [birth year] , age 25 y.o. , MRN: [id number] , ADDRESS: [place] [address] [place] [address] PCP Eloisa Northern, MD Patient Care Team: Eloisa Northern, MD as PCP - General (Internal Medicine) Jodelle Red, MD as PCP - Cardiology (Cardiology)  This Provider for this visit: Treatment Team:  Attending Provider: Kalman Shan, MD    09/09/2023 -     HPI Nyra Market 83 y.o. -acute visit posthospitalization.  She just saw me 08/26/2023.  Then 4 days later on 08/30/2023 she ended up in the ER.  Review of the records show that she  had vomiting the previous day and then the next morning she was no acute hypoxic respiratory failure.  She got treated with antibiotics and steroids.  She did have an echocardiogram that did not show pulmonary hypertension.  She also had a BNP that was normal.  She had significant pedal edema.  Her UA was abnormal.  A the cellulitis of urinary tract infection was because of her vomiting but she is on nintedanib.  She was treated with antibiotics.  She is now even more physically deconditioned but managing.  She is off oxygen at rest.  She did get some steroids.  She is discharged and she is presenting with one of her friends and a caretaker.   PFT    OV 10/01/2023  Subjective:  Patient ID: Nyra Market, female , DOB: 17-Apr-1941 , age 53 y.o. , MRN: [id number] , ADDRESS: [place] [address] [place] [address] PCP Eloisa Northern, MD Patient Care Team: Eloisa Northern, MD as PCP - General (Internal Medicine) Jodelle Red, MD as PCP - Cardiology (Cardiology) Type of visit: Video Virtual Visit Identification of patient GEENA WEINHOLD with 09-23-1940 and MRN [id number] - 2 person identifier Risks: Risks, benefits, limitations of telephone visit explained. Patient understood and verbalized agreement to proceed Anyone else on call: her friend Patient location: her rejab jp,e This provider location: [address]; [place]; [place] [address]. [facility]. [phone]      This Provider for this visit: Treatment Team:  Attending Provider: Kalman Shan, MD   10/01/2023 -    HPI Nyra Market 83 y.o. -just saw her 3 weeks ago.  She still has a follow-up from hospitalization that is pending in 1 week.  When I saw her last time she was extremely fatigued after the hospitalization that was for vomiting and also cellulitis.  I advised her to stop her nintedanib and which she did for 2 weeks and then restarted currently taking 100 mg  low-dose protocol twice  daily.  She is tolerating it fine.  She said stopping and restarting it did not make any difference.  But the main complaint she has is that she is feeling poorly.  She feels fatigued [although on the camera I could not tell a difference].  She saw Dr. Timothy Lasso her primary care physician 4 days ago this week and was found to be appropriate hypoxemic 83% on room air.  Since then she has been on 3 L nasal cannula continuous.  She is on antibiotics and prednisone.  Apparently chest x-ray showed ILD along with pneumonia.  She is also having insomnia.  It since she is now 3 months past her Rituxan which she wants to get it at our infusion center through [facility].  She feels that her ILD is worse or her symptoms are worse because of the lack of Rituxan and she really wants to go back on Rituxan.  Nevertheless there is no worsening in arthritis.  She did have labs at primary care but I do not have access to this.   OV 10/07/2023  Subjective:  Patient ID: Nyra Market, female , DOB: August 29, 1940 , age 76 y.o. , MRN: [id number] , ADDRESS: [place] [address] [place] [address] PCP Eloisa Northern, MD Patient Care Team: Eloisa Northern, MD as PCP - General (Internal Medicine) Jodelle Red, MD as PCP - Cardiology (Cardiology)  This Provider for this visit: Treatment Team:  Attending Provider: Kalman Shan, MD    10/07/2023 -   Chief Complaint  Patient presents with   Hospitalization Follow-up    PCP at [facility] told her last week to increase her o2 from 2 to 3 lpm. She feels like her breathing "is fine".  Had fall yesterday. She has had some feet swelling. Not taking lasix.        Rheumatoid arthritis with Joint s/ILD- Rituxan/steroids ([facility])  - progressive phenotype (CT 2017/2019 -> Oct 2020). Planned Ofev start in Nov 2020 but did not start as of dec 2020. 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 Admitted 06/11/2019  - 06/16/2019. Rx with Remdesviri and Decadron.   - sp evushed x 2  - Admitted August 2021 for respiratory infection not otherwise specified.  Following trip to [place].  Was hypoxemic.  - Status post  influenza hospitalization for mild respiratory failure February 2025.  Small Hiatal hernia on CT Oct 2020  Rib fracture from fall Dec 2020: Musculoskeletal: There is nondisplaced fractures of the posterior left fifth, 6, 8, 9, and tenth left ribs. There is unchanged slight superior compression deformities of the T5, T6, T10 vertebral bodies with less than 25% loss in height.Associated pulmnary contusion +  -On Reclast once a year through Dr. Rexene Agent  Normal ECHO Summer 2020 -> possible [facility] May 2021 RSVP 30s with GR1 ddx  -  Normal ECHO may 202   HPI DEZIYA AMERO 83 y.o. -returns for posthospital follow-up.  I saw her in February face-to-face after post influenza follow-up.  Then she got hospitalized for vomiting.  I saw her on video last week and she was quite deconditioned and she was on oxygen.  Today she comes with her friend/caregiver.  She says she is much improved she is not having any nausea any vomiting.  She is tolerating nintedanib well with Zofran.  She is feeling stronger.  She is wondering if she needs to use the oxygen.  She exercised sitting and standing 10 times and did not desaturate  below 88%.  She is back to baseline.  I advised that she could just use oxygen with heavy exertion or at night she does not needed at rest.  She still wants to do her Rituxan.  Her pharmacy team is working with her [facility] rheumatologist to get it done at the infusion center here in [place] through [facility] rheumatology prescription.  She has not heard anything.  I did indicate to her that there is no contraindication from my point as of today.  She is finished antibiotics and prednisone.  On exam there is no wheezing.  Of note she fell down yesterday and has a bruise around her right mandible.  Apparently the nursing home staff thought it was because of high oxygen use.  I indicated to her that the only seen in COPD patients it does not apply to her.  She has upcoming visit with me with pulmonary function test May 2 I advised her to keep that.        SYMPTOM SCALE - ILD 03/27/2019  05/24/2019 (2-3 weeks    .mrmonpre cpovid)  08/03/2019 Post covid Post fall rib # 12/18/2019  04/10/2020 142# resp virus admit aug 2021 nos 08/01/2020 134#  10/22/2020 136# Now doing rehab 12/23/2020 136# 03/10/2021 131# Ofev 150mg  bid 04/08/2021 132# Ofev 100mg  bid 07/18/2021 133# ofev 100 x 2 03/06/2022 132#, ofev 100x2 12/11/2022 128# 03/02/2023  09/09/2023 POST ADMIT  O2 use ra       ra ra ra ra ra ra ra ra  Shortness of Breath 0 -> 5 scale with 5 being worst (score 6 If unable to do)                At rest 0 2 3 2 3 0 3 0 3 1  2 1  4 3   Simple tasks - showers, clothes change, eating, shaving 0 3 2 3 4 1 3 2 3 1  3 3  3 3   Household (dishes, doing bed, laundry) 2 4 5 3 4 3 3 3 4 3 3 4  4 5   Shopping 2 3 6 4 4 4 4 3 4 4 4 4  4 5   Walking level at own pace 2 3 4 4 3 2 4 2 3 3 3 4  4 5   Walking up Stairs 3 3 5 4 4 4 4 3 4 4 4 5  4 5   Total (40 - 48) Dyspnea Score 9 17 25 20 22 14 21 13 21 16 19 21  23 26   How bad is your cough? 2 2 3 1 2  0 0 0 1 0 0 0  3 1  How bad is your fatigue 3 3.5 5 3.5 3 4 3 2 3 3 4 4  3  5   nause    0 1 1 0 1 2.5 1 1  1  0 0  vomit    0 1 1 0 1 2.4 1 1 1   0 0  diarrhea    0 1 0 0 0 1 0 0 0  0 3  anxiety    0 2 0 1 0 1 0 0 1  0 0  depression    0 1 0 0 0 0 0 0 0  0 0     Simple office walk  feet x  3 laps goal with forehead probe 04/12/2018  07/21/2018  05/24/2019  12/18/2019  04/10/2020  08/01/2020  10/22/2020  03/10/2021  07/18/2021  03/06/2022  12/11/2022  08/26/2023  10/07/2023   O2 used Room air - off o2 x 10 min Room air  Room air ra ra ra ra ra ra ra ra ra ra  Number laps completed 3 x 185 feet 3 x 250 feet    r    3 1 lap Sit and satnd - di dnot need walker Sit stand times 10 times without using a walker  Comments about pace normal normal   Slow, no walker Slow . No walker.  Slow. No walker. No cane walker   waler    Resting Pulse Ox/HR 99% and 72/min 97% and 74/min 97% and 81/min 97% and 76/m 96% and 88/min 94% and 83 96% and HR 79 97% and HR 51 100% and 78 99% and HR 73 97% and HR 76 98% and HR 78 And 98% and heart rate 83  Final Pulse Ox/HR 98% and 95/min 96% and 96/min 94% and 107/min 93% and 94 92% and 97 90% and 100 89% and 104/min 92% and 106 97% and 102 92% and HR 105 94% ad HR 80 93% and HR 90 92% and heart rate 93.  Desaturated </= 88% no no no no no yes yes no no no     Desaturated <= 3% points no no Yes, 3 pints Ues, 4 points Yes,  4 points Yes, 4 points Yes, 7 points  Yes, 3 pints Yes, 7 poins  Yes 5 points Yes 6 points  Got Tachycardic >/= 90/min yes yes yes yes yes yes yes   yes     Symptoms at end of test No complaint Mild dyspnea Mod dyspnea Some dyspnea dyspnea dyspnie cat 3d Dyspneic moderate  Dyspneic and wheezing Mod dyspnea Mopd dyspea     Miscellaneous comments improed from hospital Same v improved woprse same  Wobbly gait baseline Needed 2 break  Stopped x 2 to get balanced and staggered while alking         PFT     Latest Ref Rng & Units 03/01/2023   11:45 AM 03/06/2022    3:01 PM 07/02/2021    3:07 PM 03/07/2021   11:05 AM 12/23/2020    1:03  PM 10/22/2020    9:05 AM 09/10/2020    9:52 AM  PFT Results  FVC-Pre L 1.81  1.62  1.69  1.78  1.66  1.73  1.76   FVC-Predicted Pre % 69  61  62  66  60  63  64   Pre FEV1/FVC % % 76  74  73  72  71  76  75   FEV1-Pre L 1.39  1.20  1.23  1.28  1.17  1.30  1.32   FEV1-Predicted Pre % 71  61  61  64  57  64  64   DLCO uncorrected ml/min/mmHg 11.53  12.57  15.35  12.94  14.98  13.67  15.38   DLCO UNC% % 59  65  79  66  77  70  79   DLCO corrected ml/min/mmHg 11.53  12.57  15.35  12.94  14.98  13.80  15.38   DLCO COR %Predicted % 59  65  79  66  77  71  79   DLVA Predicted % 78  84  100  84  86  80  102        LAB RESULTS last 96 hours No results found.       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   reports that she has never smoked. She has never used smokeless tobacco.  Past Surgical History:  Procedure Laterality Date   CHOLECYSTECTOMY  2011   IR ANGIO INTRA EXTRACRAN SEL COM CAROTID INNOMINATE BILAT MOD SED  06/18/2020   IR ANGIO VERTEBRAL SEL SUBCLAVIAN INNOMINATE UNI R MOD SED  06/18/2020   IR ANGIO VERTEBRAL SEL VERTEBRAL UNI L MOD SED  06/18/2020   WISDOM TOOTH EXTRACTION      Allergies  Allergen Reactions   Penicillins Other (See Comments)    Unknown reaction  Did it involve swelling of the face/tongue/throat, SOB, or low BP? Unknown Did it involve sudden or severe rash/hives, skin peeling, or any reaction on the inside of your mouth or nose? Unknown Did you need to seek medical attention at a hospital or doctor's office? No When did it last happen?     childhood  If all above answers are "NO", may proceed with cephalosporin use.     Remicade  [Infliximab] Other (See Comments)    Unknown reaction    Immunization History  Administered Date(s) Administered   Fluad Quad(high Dose 65+) 04/23/2020, 03/23/2022   Influenza Split 04/12/2013, 04/12/2014, 03/14/2015, 04/23/2016   Influenza, High Dose Seasonal PF 04/12/2017, 03/24/2018, 04/13/2019, 03/27/2021   Influenza-Unspecified 04/03/2013, 04/22/2017   Moderna Sars-Covid-2 Vaccination 08/25/2019, 09/22/2019, 03/07/2020   Pfizer Covid-19 Vaccine Bivalent Booster 77yrs & up 05/16/2021   Pneumococcal Conjugate-13 04/03/2013, 09/28/2014   Pneumococcal Polysaccharide-23 07/13/2012, 07/24/2013   Respiratory Syncytial Virus Vaccine,Recomb Aduvanted(Arexvy) 04/12/2022   Td 01/11/2018   Tdap 12/10/2017    Family History  Problem Relation Age of Onset   Asthma Mother    Anemia Mother    Polymyalgia rheumatica Mother    COPD Father    Pulmonary fibrosis Father    Breast cancer Maternal Grandmother 77     Current Outpatient Medications:    acetaminophen (TYLENOL) 500 MG tablet, Take 1,000 mg by mouth every 6 (six) hours as needed for moderate pain (pain score 4-6)., Disp: , Rfl:    amitriptyline (ELAVIL) 25 MG tablet, Take 25 mg by mouth at bedtime., Disp: , Rfl: 0   Calcium Carbonate-Vitamin D (CALCIUM-VITAMIN D PO), Take 1 tablet by mouth in the morning and at bedtime. 500-25 mg/mcg, Disp: , Rfl:    cholecalciferol (VITAMIN D3) 25 MCG (1000 UNIT) tablet, Take 1,000 Units by mouth every evening., Disp: , Rfl:    gabapentin (NEURONTIN) 300 MG capsule, Take 600 mg by mouth at bedtime., Disp: , Rfl:    losartan (COZAAR) 25 MG tablet, Take 25 mg by mouth daily., Disp: , Rfl:    melatonin 5 MG TABS, Take 1 tablet (5  mg total) by mouth at bedtime., Disp: , Rfl:    Nintedanib (OFEV) 100 MG CAPS, Take 1 capsule (100 mg total) by mouth 2 (two) times daily., Disp: 60 capsule, Rfl: 10   ondansetron (ZOFRAN) 4 MG tablet, TAKE ONE TABLET BY MOUTH EVERY 8 HOURS AS NEEDED FOR NAUSEA AND VOMITING  (Patient taking differently: Take 4 mg by mouth every 8 (eight) hours as needed for nausea or vomiting.), Disp: 30 tablet, Rfl: 5   pantoprazole (PROTONIX) 40 MG tablet, Take 40 mg by mouth daily., Disp: , Rfl:    polyethylene glycol (MIRALAX / GLYCOLAX) 17 g packet, Take 17 g by mouth daily as needed for mild constipation., Disp: 14 each, Rfl: 0   pravastatin (PRAVACHOL) 20 MG tablet, Take 1 tablet (20 mg total) by mouth daily., Disp: 90 tablet, Rfl: 1   venlafaxine XR (EFFEXOR-XR) 75 MG 24 hr capsule, Take 75 mg by mouth daily., Disp: , Rfl:       Objective:   Vitals:   10/07/23 1627 10/07/23 1628  BP:  114/65  Pulse: 83   Temp:  98 F (36.7 C)  TempSrc:  Oral  SpO2: 95%   Weight:  125 lb (56.7 kg)  Height:  5\' 6"  (1.676 m)    Estimated body mass index is 20.18 kg/m as calculated from the following:   Height as of this encounter: 5\' 6"  (1.676 m).   Weight as of this encounter: 125 lb (56.7 kg).  @WEIGHTCHANGE @  [employer]   10/07/23 1628  Weight: 125 lb (56.7 kg)     Physical Exam   General: No distress.  Looks a lot better. O2 at rest: no Cane present: no Sitting in wheel chair: no Frail: yes Obese: n Neuro: Alert and Oriented x 3. GCS 15. Speech normal Psych: Pleasant Resp:  Barrel Chest - no.  Wheeze - no, Crackles - yes bsae, No overt respiratory distress CVS: Normal heart sounds. 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 PEERL +. No post nasal drip        Assessment:       ICD-10-CM   1. ILD (interstitial lung disease) (HCC)  J84.9 Pulmonary function test    2. Hospital discharge follow-up  Z09     3. Pedal edema  R60.0     4. Physical deconditioning  R53.81     5. Frequent falls  R29.6          Plan:     Patient Instructions  History of asthma Interstitial lung disease due to connective tissue disease (HCC) Encounter for long-term current use of high risk medication  -Today there is  normal wheezing - In addition simple exercise test showed that your oxygen level both at rest and with exercise are holding up above 88%  -This implies you do not need oxygen for rest and simple exertion. -Glad doing well tolerating the nintedanib well with Zofran and not having any nausea vomiting -Glad leg swelling is improved -Glad overall physical conditioning is improved   Plan - Continue nintedanib -Continue oxygen at night and with heavy exertion keeping pulse ox of 88%  = You do not need oxygen at rest -Continue Breo -Do spirometry in 8 weeks  PEDAL EDEMA x few months ECHO - no pulmonary hypertension Feb 2025 BNP - normal FEb 2025  -Significantly improved on this visit 10/07/2023  Plan According to primary care physician  Falls with bruising by the right mandible  -  This fall on 10/06/2023 is unrelated to her lung disease or oxygen use  History of rheumatoid arthritis  Plan  -Our  pharmacy team is working with [facility] rheumatologist to restart her Rituxan -I do not see any contraindication for Rituxan as of today 10/07/2023 given improvement  Followup - May 2nd visit k eep it iwith DR Marchelle Gearing after PFT   FOLLOWUP Return in about 8 weeks (around 12/02/2023) for Keep Nov 12, 2023 visit with Dr. Marchelle Gearing after PFT.    SIGNATURE    Dr. Kalman Shan, M.D., F.C.C.P,  Pulmonary and Critical Care Medicine Staff Physician, [facility] Director - Interstitial Lung Disease  Program  Pulmonary Fibrosis [facility] at [facility] [place], [place], [address]  Pager: [phone], If no answer or between  15:00h - 7:00h: call [phone] Telephone: [phone]  5:22 PM 10/07/2023

## 2023-10-07 NOTE — Patient Instructions (Addendum)
 History of asthma Interstitial lung disease due to connective tissue disease (HCC) Encounter for long-term current use of high risk medication  -Today there is normal wheezing - In addition simple exercise test showed that your oxygen level both at rest and with exercise are holding up above 88%  -This implies you do not need oxygen for rest and simple exertion. -Glad doing well tolerating the nintedanib well with Zofran and not having any nausea vomiting -Glad leg swelling is improved -Glad overall physical conditioning is improved   Plan - Continue nintedanib -Continue oxygen at night and with heavy exertion keeping pulse ox of 88%  = You do not need oxygen at rest -Continue Breo -Do spirometry in 8 weeks  PEDAL EDEMA x few months ECHO - no pulmonary hypertension Feb 2025 BNP - normal FEb 2025  -Significantly improved on this visit 10/07/2023  Plan According to primary care physician  Falls with bruising by the right mandible  -This fall on 10/06/2023 is unrelated to her lung disease or oxygen use  History of rheumatoid arthritis  Plan  -Our  pharmacy team is working with [facility] rheumatologist to restart her Rituxan -I do not see any contraindication for Rituxan as of today 10/07/2023 given improvement  Followup - May 2nd visit k eep it iwith DR Marchelle Gearing after PFT

## 2023-10-08 DIAGNOSIS — J9611 Chronic respiratory failure with hypoxia: Secondary | ICD-10-CM | POA: Diagnosis not present

## 2023-10-08 DIAGNOSIS — M0510 Rheumatoid lung disease with rheumatoid arthritis of unspecified site: Secondary | ICD-10-CM | POA: Diagnosis not present

## 2023-10-08 DIAGNOSIS — J440 Chronic obstructive pulmonary disease with acute lower respiratory infection: Secondary | ICD-10-CM | POA: Diagnosis not present

## 2023-10-08 DIAGNOSIS — J189 Pneumonia, unspecified organism: Secondary | ICD-10-CM | POA: Diagnosis not present

## 2023-10-08 DIAGNOSIS — I10 Essential (primary) hypertension: Secondary | ICD-10-CM | POA: Diagnosis not present

## 2023-10-08 DIAGNOSIS — J849 Interstitial pulmonary disease, unspecified: Secondary | ICD-10-CM | POA: Diagnosis not present

## 2023-10-08 NOTE — Telephone Encounter (Signed)
 Called Dr Ferd Hibbs office and got the vm again for medical records- [facility] for labs to be faxed to me directly   Will await labs

## 2023-10-11 DIAGNOSIS — J449 Chronic obstructive pulmonary disease, unspecified: Secondary | ICD-10-CM | POA: Diagnosis not present

## 2023-10-11 DIAGNOSIS — I87319 Chronic venous hypertension (idiopathic) with ulcer of unspecified lower extremity: Secondary | ICD-10-CM | POA: Diagnosis not present

## 2023-10-11 DIAGNOSIS — Z1389 Encounter for screening for other disorder: Secondary | ICD-10-CM | POA: Diagnosis not present

## 2023-10-11 DIAGNOSIS — G9389 Other specified disorders of brain: Secondary | ICD-10-CM | POA: Diagnosis not present

## 2023-10-11 DIAGNOSIS — I872 Venous insufficiency (chronic) (peripheral): Secondary | ICD-10-CM | POA: Diagnosis not present

## 2023-10-11 DIAGNOSIS — M0510 Rheumatoid lung disease with rheumatoid arthritis of unspecified site: Secondary | ICD-10-CM | POA: Diagnosis not present

## 2023-10-11 DIAGNOSIS — M069 Rheumatoid arthritis, unspecified: Secondary | ICD-10-CM | POA: Diagnosis not present

## 2023-10-11 DIAGNOSIS — J9611 Chronic respiratory failure with hypoxia: Secondary | ICD-10-CM | POA: Diagnosis not present

## 2023-10-11 DIAGNOSIS — J849 Interstitial pulmonary disease, unspecified: Secondary | ICD-10-CM | POA: Diagnosis not present

## 2023-10-11 DIAGNOSIS — Z1331 Encounter for screening for depression: Secondary | ICD-10-CM | POA: Diagnosis not present

## 2023-10-11 DIAGNOSIS — I2510 Atherosclerotic heart disease of native coronary artery without angina pectoris: Secondary | ICD-10-CM | POA: Diagnosis not present

## 2023-10-11 DIAGNOSIS — Z0000 Encounter for general adult medical examination without abnormal findings: Secondary | ICD-10-CM | POA: Diagnosis not present

## 2023-10-11 DIAGNOSIS — F419 Anxiety disorder, unspecified: Secondary | ICD-10-CM | POA: Diagnosis not present

## 2023-10-11 DIAGNOSIS — D8989 Other specified disorders involving the immune mechanism, not elsewhere classified: Secondary | ICD-10-CM | POA: Diagnosis not present

## 2023-10-12 DIAGNOSIS — J849 Interstitial pulmonary disease, unspecified: Secondary | ICD-10-CM | POA: Diagnosis not present

## 2023-10-12 DIAGNOSIS — I10 Essential (primary) hypertension: Secondary | ICD-10-CM | POA: Diagnosis not present

## 2023-10-12 DIAGNOSIS — J440 Chronic obstructive pulmonary disease with acute lower respiratory infection: Secondary | ICD-10-CM | POA: Diagnosis not present

## 2023-10-12 DIAGNOSIS — M0510 Rheumatoid lung disease with rheumatoid arthritis of unspecified site: Secondary | ICD-10-CM | POA: Diagnosis not present

## 2023-10-12 DIAGNOSIS — J189 Pneumonia, unspecified organism: Secondary | ICD-10-CM | POA: Diagnosis not present

## 2023-10-12 DIAGNOSIS — J9611 Chronic respiratory failure with hypoxia: Secondary | ICD-10-CM | POA: Diagnosis not present

## 2023-10-14 NOTE — Telephone Encounter (Signed)
 Received fax from [facility] rheum.  Rituximab infusions are on hold for now due to recent infections  Dated 10/11/2023

## 2023-10-15 ENCOUNTER — Ambulatory Visit: Payer: Self-pay

## 2023-10-15 ENCOUNTER — Other Ambulatory Visit: Payer: Self-pay | Admitting: Internal Medicine

## 2023-10-15 DIAGNOSIS — I10 Essential (primary) hypertension: Secondary | ICD-10-CM | POA: Diagnosis not present

## 2023-10-15 DIAGNOSIS — J440 Chronic obstructive pulmonary disease with acute lower respiratory infection: Secondary | ICD-10-CM | POA: Diagnosis not present

## 2023-10-15 DIAGNOSIS — J849 Interstitial pulmonary disease, unspecified: Secondary | ICD-10-CM | POA: Diagnosis not present

## 2023-10-15 DIAGNOSIS — M0510 Rheumatoid lung disease with rheumatoid arthritis of unspecified site: Secondary | ICD-10-CM | POA: Diagnosis not present

## 2023-10-15 DIAGNOSIS — J9611 Chronic respiratory failure with hypoxia: Secondary | ICD-10-CM | POA: Diagnosis not present

## 2023-10-15 DIAGNOSIS — J189 Pneumonia, unspecified organism: Secondary | ICD-10-CM | POA: Diagnosis not present

## 2023-10-15 NOTE — Telephone Encounter (Signed)
 Copied from CRM [id number]. Topic: Clinical - Medication Refill >> Oct 15, 2023  1:35 PM Gaetano Hawthorne wrote: Most Recent Primary Care Visit:   Medication: sulfamethoxazole 800 mg/trimethoprim  Has the patient contacted their pharmacy? Yes, Pharmacy is the one calling on behalf because the medication was denied a few times with the comment of '' medication refill not appropriate" - Pharmacy would like to know what this denial means.    (Agent: If no, request that the patient contact the pharmacy for the refill. If patient does not wish to contact the pharmacy document the reason why and proceed with request.) (Agent: If yes, when and what did the pharmacy advise?)  Is this the correct pharmacy for this prescription? Yes If no, delete pharmacy and type the correct one.  This is the patient's preferred pharmacy:  [facility] [place], [place] - [name] [place] [address] Phone: [phone] Fax: [phone]    Has the prescription been filled recently? No  Is the patient out of the medication? Yes  Has the patient been seen for an appointment in the last year OR does the patient have an upcoming appointment? Yes  Can we respond through MyChart? Yes  Agent: Please be advised that Rx refills may take up to 3 business days. We ask that you follow-up with your pharmacy.   This Triage RN attempted to contact the patient at this time, no answer, left a voicemail.

## 2023-10-15 NOTE — Telephone Encounter (Signed)
 The patient is requesting Bactrim to accompany her OFEV as she takes these medications together. Requesting delivery on Monday as she is in [place].

## 2023-10-18 NOTE — Telephone Encounter (Signed)
 Routing to Dr Marchelle Gearing to advise.

## 2023-10-19 DIAGNOSIS — J440 Chronic obstructive pulmonary disease with acute lower respiratory infection: Secondary | ICD-10-CM | POA: Diagnosis not present

## 2023-10-19 DIAGNOSIS — J189 Pneumonia, unspecified organism: Secondary | ICD-10-CM | POA: Diagnosis not present

## 2023-10-19 DIAGNOSIS — M0510 Rheumatoid lung disease with rheumatoid arthritis of unspecified site: Secondary | ICD-10-CM | POA: Diagnosis not present

## 2023-10-19 DIAGNOSIS — I10 Essential (primary) hypertension: Secondary | ICD-10-CM | POA: Diagnosis not present

## 2023-10-19 DIAGNOSIS — J9611 Chronic respiratory failure with hypoxia: Secondary | ICD-10-CM | POA: Diagnosis not present

## 2023-10-19 DIAGNOSIS — J849 Interstitial pulmonary disease, unspecified: Secondary | ICD-10-CM | POA: Diagnosis not present

## 2023-10-20 NOTE — Telephone Encounter (Signed)
 The bactrim is to prevent opportunistic infections with Rituxan. It is not for ofev. She can hold off on it till [facility] restarts Rituxan

## 2023-10-21 DIAGNOSIS — J849 Interstitial pulmonary disease, unspecified: Secondary | ICD-10-CM | POA: Diagnosis not present

## 2023-10-21 DIAGNOSIS — I10 Essential (primary) hypertension: Secondary | ICD-10-CM | POA: Diagnosis not present

## 2023-10-21 DIAGNOSIS — E785 Hyperlipidemia, unspecified: Secondary | ICD-10-CM | POA: Diagnosis not present

## 2023-10-21 DIAGNOSIS — Z7952 Long term (current) use of systemic steroids: Secondary | ICD-10-CM | POA: Diagnosis not present

## 2023-10-21 DIAGNOSIS — F329 Major depressive disorder, single episode, unspecified: Secondary | ICD-10-CM | POA: Diagnosis not present

## 2023-10-21 DIAGNOSIS — J9611 Chronic respiratory failure with hypoxia: Secondary | ICD-10-CM | POA: Diagnosis not present

## 2023-10-21 DIAGNOSIS — J189 Pneumonia, unspecified organism: Secondary | ICD-10-CM | POA: Diagnosis not present

## 2023-10-21 DIAGNOSIS — K219 Gastro-esophageal reflux disease without esophagitis: Secondary | ICD-10-CM | POA: Diagnosis not present

## 2023-10-21 DIAGNOSIS — M0510 Rheumatoid lung disease with rheumatoid arthritis of unspecified site: Secondary | ICD-10-CM | POA: Diagnosis not present

## 2023-10-21 DIAGNOSIS — J440 Chronic obstructive pulmonary disease with acute lower respiratory infection: Secondary | ICD-10-CM | POA: Diagnosis not present

## 2023-10-21 DIAGNOSIS — R296 Repeated falls: Secondary | ICD-10-CM | POA: Diagnosis not present

## 2023-10-21 DIAGNOSIS — Z556 Problems related to health literacy: Secondary | ICD-10-CM | POA: Diagnosis not present

## 2023-10-21 DIAGNOSIS — M800AXD Age-related osteoporosis with current pathological fracture, other site, subsequent encounter for fracture with routine healing: Secondary | ICD-10-CM | POA: Diagnosis not present

## 2023-10-21 NOTE — Telephone Encounter (Signed)
 Called pt and there was no answer- LMTCB.   Need to relay the following per MR:  The bactrim is to prevent opportunistic infections with Rituxan. It is not for ofev. She can hold off on it till [facility] restarts Rituxan

## 2023-10-22 DIAGNOSIS — I10 Essential (primary) hypertension: Secondary | ICD-10-CM | POA: Diagnosis not present

## 2023-10-22 DIAGNOSIS — J189 Pneumonia, unspecified organism: Secondary | ICD-10-CM | POA: Diagnosis not present

## 2023-10-22 DIAGNOSIS — J9611 Chronic respiratory failure with hypoxia: Secondary | ICD-10-CM | POA: Diagnosis not present

## 2023-10-22 DIAGNOSIS — J849 Interstitial pulmonary disease, unspecified: Secondary | ICD-10-CM | POA: Diagnosis not present

## 2023-10-22 DIAGNOSIS — J440 Chronic obstructive pulmonary disease with acute lower respiratory infection: Secondary | ICD-10-CM | POA: Diagnosis not present

## 2023-10-22 DIAGNOSIS — M0510 Rheumatoid lung disease with rheumatoid arthritis of unspecified site: Secondary | ICD-10-CM | POA: Diagnosis not present

## 2023-10-26 DIAGNOSIS — J9611 Chronic respiratory failure with hypoxia: Secondary | ICD-10-CM | POA: Diagnosis not present

## 2023-10-26 DIAGNOSIS — J189 Pneumonia, unspecified organism: Secondary | ICD-10-CM | POA: Diagnosis not present

## 2023-10-26 DIAGNOSIS — M0510 Rheumatoid lung disease with rheumatoid arthritis of unspecified site: Secondary | ICD-10-CM | POA: Diagnosis not present

## 2023-10-26 DIAGNOSIS — I10 Essential (primary) hypertension: Secondary | ICD-10-CM | POA: Diagnosis not present

## 2023-10-26 DIAGNOSIS — J440 Chronic obstructive pulmonary disease with acute lower respiratory infection: Secondary | ICD-10-CM | POA: Diagnosis not present

## 2023-10-26 DIAGNOSIS — J849 Interstitial pulmonary disease, unspecified: Secondary | ICD-10-CM | POA: Diagnosis not present

## 2023-10-27 ENCOUNTER — Telehealth: Payer: Self-pay

## 2023-10-27 DIAGNOSIS — I10 Essential (primary) hypertension: Secondary | ICD-10-CM | POA: Diagnosis not present

## 2023-10-27 DIAGNOSIS — J849 Interstitial pulmonary disease, unspecified: Secondary | ICD-10-CM | POA: Diagnosis not present

## 2023-10-27 DIAGNOSIS — J189 Pneumonia, unspecified organism: Secondary | ICD-10-CM | POA: Diagnosis not present

## 2023-10-27 DIAGNOSIS — J9611 Chronic respiratory failure with hypoxia: Secondary | ICD-10-CM | POA: Diagnosis not present

## 2023-10-27 DIAGNOSIS — J440 Chronic obstructive pulmonary disease with acute lower respiratory infection: Secondary | ICD-10-CM | POA: Diagnosis not present

## 2023-10-27 DIAGNOSIS — M0510 Rheumatoid lung disease with rheumatoid arthritis of unspecified site: Secondary | ICD-10-CM | POA: Diagnosis not present

## 2023-10-27 NOTE — Telephone Encounter (Unsigned)
 Copied from CRM [id number]. Topic: Clinical - Prescription Issue >> Oct 27, 2023  4:22 PM Isabell A wrote: Reason for CRM: Tammara from [facility] calling in regard to status of the prior authorization for Nintedanib (OFEV) 100 MG CAPS.  Callback number: [id number]  CoverMyMeds Key: [id number]

## 2023-10-28 DIAGNOSIS — I10 Essential (primary) hypertension: Secondary | ICD-10-CM | POA: Diagnosis not present

## 2023-10-28 DIAGNOSIS — J440 Chronic obstructive pulmonary disease with acute lower respiratory infection: Secondary | ICD-10-CM | POA: Diagnosis not present

## 2023-10-28 DIAGNOSIS — M0510 Rheumatoid lung disease with rheumatoid arthritis of unspecified site: Secondary | ICD-10-CM | POA: Diagnosis not present

## 2023-10-28 DIAGNOSIS — J849 Interstitial pulmonary disease, unspecified: Secondary | ICD-10-CM | POA: Diagnosis not present

## 2023-10-28 DIAGNOSIS — J189 Pneumonia, unspecified organism: Secondary | ICD-10-CM | POA: Diagnosis not present

## 2023-10-28 DIAGNOSIS — J9611 Chronic respiratory failure with hypoxia: Secondary | ICD-10-CM | POA: Diagnosis not present

## 2023-11-01 ENCOUNTER — Telehealth: Payer: Self-pay

## 2023-11-01 DIAGNOSIS — J849 Interstitial pulmonary disease, unspecified: Secondary | ICD-10-CM | POA: Diagnosis not present

## 2023-11-01 DIAGNOSIS — J9611 Chronic respiratory failure with hypoxia: Secondary | ICD-10-CM | POA: Diagnosis not present

## 2023-11-01 DIAGNOSIS — M0510 Rheumatoid lung disease with rheumatoid arthritis of unspecified site: Secondary | ICD-10-CM | POA: Diagnosis not present

## 2023-11-01 DIAGNOSIS — J440 Chronic obstructive pulmonary disease with acute lower respiratory infection: Secondary | ICD-10-CM | POA: Diagnosis not present

## 2023-11-01 DIAGNOSIS — J189 Pneumonia, unspecified organism: Secondary | ICD-10-CM | POA: Diagnosis not present

## 2023-11-01 DIAGNOSIS — I10 Essential (primary) hypertension: Secondary | ICD-10-CM | POA: Diagnosis not present

## 2023-11-01 NOTE — Telephone Encounter (Signed)
 Submitted a Prior Authorization request to [facility] for OFEV  via CoverMyMeds. Will update once we receive a response.  Key: [id number]

## 2023-11-02 MED ORDER — OFEV 100 MG PO CAPS: 1.0000 | ORAL_CAPSULE | Freq: Two times a day (BID) | ORAL | 5 refills | Status: AC

## 2023-11-02 NOTE — Telephone Encounter (Signed)
 Received notification from [facility] regarding a prior authorization for OFEV . Authorization has been APPROVED from 11/01/23 to 10/31/24. Approval letter sent to scan center.  Patient can continue to fill through [facility]: [phone]. Refill sent to pharmacy.  Geraldene Kleine, PharmD, MPH, BCPS, CPP Clinical Pharmacist (Rheumatology and Pulmonology)

## 2023-11-03 ENCOUNTER — Telehealth: Payer: Self-pay

## 2023-11-03 DIAGNOSIS — M0510 Rheumatoid lung disease with rheumatoid arthritis of unspecified site: Secondary | ICD-10-CM | POA: Diagnosis not present

## 2023-11-03 DIAGNOSIS — I10 Essential (primary) hypertension: Secondary | ICD-10-CM | POA: Diagnosis not present

## 2023-11-03 DIAGNOSIS — J849 Interstitial pulmonary disease, unspecified: Secondary | ICD-10-CM | POA: Diagnosis not present

## 2023-11-03 DIAGNOSIS — J440 Chronic obstructive pulmonary disease with acute lower respiratory infection: Secondary | ICD-10-CM | POA: Diagnosis not present

## 2023-11-03 DIAGNOSIS — J9611 Chronic respiratory failure with hypoxia: Secondary | ICD-10-CM | POA: Diagnosis not present

## 2023-11-03 DIAGNOSIS — J189 Pneumonia, unspecified organism: Secondary | ICD-10-CM | POA: Diagnosis not present

## 2023-11-03 NOTE — Telephone Encounter (Signed)
 Copied from CRM [id number]. Topic: Clinical - Prescription Issue >> Nov 01, 2023  5:03 PM Tyronne Galloway wrote: Reason for CRM: Approved PA from [facility] regarding Nintedanib  (OFEV ) 100 MG CAPS Starting on 11/01/2023 for a year through 10/31/2024. A fax will be sent over as well to confirm the PA.

## 2023-11-05 DIAGNOSIS — J189 Pneumonia, unspecified organism: Secondary | ICD-10-CM | POA: Diagnosis not present

## 2023-11-05 DIAGNOSIS — J9611 Chronic respiratory failure with hypoxia: Secondary | ICD-10-CM | POA: Diagnosis not present

## 2023-11-05 DIAGNOSIS — J440 Chronic obstructive pulmonary disease with acute lower respiratory infection: Secondary | ICD-10-CM | POA: Diagnosis not present

## 2023-11-05 DIAGNOSIS — M0510 Rheumatoid lung disease with rheumatoid arthritis of unspecified site: Secondary | ICD-10-CM | POA: Diagnosis not present

## 2023-11-05 DIAGNOSIS — I10 Essential (primary) hypertension: Secondary | ICD-10-CM | POA: Diagnosis not present

## 2023-11-05 DIAGNOSIS — J849 Interstitial pulmonary disease, unspecified: Secondary | ICD-10-CM | POA: Diagnosis not present

## 2023-11-08 DIAGNOSIS — I10 Essential (primary) hypertension: Secondary | ICD-10-CM | POA: Diagnosis not present

## 2023-11-08 DIAGNOSIS — J189 Pneumonia, unspecified organism: Secondary | ICD-10-CM | POA: Diagnosis not present

## 2023-11-08 DIAGNOSIS — J9611 Chronic respiratory failure with hypoxia: Secondary | ICD-10-CM | POA: Diagnosis not present

## 2023-11-08 DIAGNOSIS — M0510 Rheumatoid lung disease with rheumatoid arthritis of unspecified site: Secondary | ICD-10-CM | POA: Diagnosis not present

## 2023-11-08 DIAGNOSIS — J849 Interstitial pulmonary disease, unspecified: Secondary | ICD-10-CM | POA: Diagnosis not present

## 2023-11-08 DIAGNOSIS — J440 Chronic obstructive pulmonary disease with acute lower respiratory infection: Secondary | ICD-10-CM | POA: Diagnosis not present

## 2023-11-09 ENCOUNTER — Ambulatory Visit
Admit: 2023-11-09 | Discharge: 2023-11-09 | Disposition: A | Discharge status: Home / Self Care | Payer: Medicare Other | Attending: Internal Medicine | Admitting: Internal Medicine

## 2023-11-09 DIAGNOSIS — R600 Localized edema: Secondary | ICD-10-CM

## 2023-11-09 DIAGNOSIS — R0989 Other specified symptoms and signs involving the circulatory and respiratory systems: Secondary | ICD-10-CM

## 2023-11-09 DIAGNOSIS — Z79899 Other long term (current) drug therapy: Secondary | ICD-10-CM

## 2023-11-09 DIAGNOSIS — I2510 Atherosclerotic heart disease of native coronary artery without angina pectoris: Secondary | ICD-10-CM | POA: Diagnosis not present

## 2023-11-09 DIAGNOSIS — R0609 Other forms of dyspnea: Secondary | ICD-10-CM

## 2023-11-09 DIAGNOSIS — J849 Interstitial pulmonary disease, unspecified: Secondary | ICD-10-CM | POA: Diagnosis not present

## 2023-11-09 DIAGNOSIS — I700 Atherosclerosis of aorta: Secondary | ICD-10-CM | POA: Diagnosis not present

## 2023-11-09 DIAGNOSIS — J984 Other disorders of lung: Secondary | ICD-10-CM | POA: Diagnosis not present

## 2023-11-12 ENCOUNTER — Telehealth: Payer: Self-pay | Admitting: Internal Medicine

## 2023-11-12 ENCOUNTER — Ambulatory Visit: Payer: Medicare Other | Admitting: Internal Medicine

## 2023-11-12 ENCOUNTER — Encounter: Payer: Self-pay | Admitting: Internal Medicine

## 2023-11-12 VITALS — BP 114/66 | HR 78 | Ht 65.0 in | Wt 125.0 lb

## 2023-11-12 DIAGNOSIS — J849 Interstitial pulmonary disease, unspecified: Secondary | ICD-10-CM

## 2023-11-12 DIAGNOSIS — R062 Wheezing: Secondary | ICD-10-CM | POA: Diagnosis not present

## 2023-11-12 DIAGNOSIS — R0989 Other specified symptoms and signs involving the circulatory and respiratory systems: Secondary | ICD-10-CM

## 2023-11-12 DIAGNOSIS — Z79899 Other long term (current) drug therapy: Secondary | ICD-10-CM

## 2023-11-12 DIAGNOSIS — R296 Repeated falls: Secondary | ICD-10-CM

## 2023-11-12 DIAGNOSIS — R0609 Other forms of dyspnea: Secondary | ICD-10-CM

## 2023-11-12 DIAGNOSIS — R5381 Other malaise: Secondary | ICD-10-CM

## 2023-11-12 DIAGNOSIS — M069 Rheumatoid arthritis, unspecified: Secondary | ICD-10-CM

## 2023-11-12 DIAGNOSIS — R600 Localized edema: Secondary | ICD-10-CM | POA: Diagnosis not present

## 2023-11-12 DIAGNOSIS — I5189 Other ill-defined heart diseases: Secondary | ICD-10-CM | POA: Diagnosis not present

## 2023-11-12 DIAGNOSIS — R54 Age-related physical debility: Secondary | ICD-10-CM | POA: Diagnosis not present

## 2023-11-12 LAB — PULMONARY FUNCTION TEST
DL/VA % pred: 67 %
DL/VA: 2.71 ml/min/mmHg/L
DLCO unc % pred: 45 %
DLCO unc: 8.7 ml/min/mmHg
FEF 25-75 Pre: 0.54 L/s
FEF2575-%Pred-Pre: 40 %
FEV1-%Pred-Pre: 51 %
FEV1-Pre: 0.99 L
FEV1FVC-%Pred-Pre: 91 %
FEV6-%Pred-Pre: 59 %
FEV6-Pre: 1.46 L
FEV6FVC-%Pred-Pre: 105 %
FVC-%Pred-Pre: 56 %
FVC-Pre: 1.47 L
Pre FEV1/FVC ratio: 67 %
Pre FEV6/FVC Ratio: 99 %

## 2023-11-12 MED ORDER — PREDNISONE 10 MG PO TABS: ORAL_TABLET | ORAL | 0 refills | Status: DC

## 2023-11-12 NOTE — Progress Notes (Signed)
 Brief patient profile:  35 yowf  never smoker with allergies/inhalers as child outgrew by [employer] then  RA since around 2000  Prednisone   x decades and prev eval by Dr Brent Cambric around 2004 for sob resolved s maint rx and referred 05/26/2013 by Dr Schuyler Custard for bronchitis and abn cxr   History of Present Illness  05/26/2013 1[facility] office visit/ Wert cc June 2014 dx pna  In [place] and remicade stopped and 100% better and placed arencia in September 2014  then abruptly worse first week in November with cough green sputum s nasal symptoms, fever low grade and no cp or cough and completely recovered prior to OV does not recall abx but issue is why keeps getting sick and abn CT Chest (see below).   Arthritis symptoms well controlled at present on Rx for RA rec Nexium 40 mg Take 30-60 min before first meal of the day and add pepcid  20 mg one at bedtime whenever coughing.     10/19/2017  f/u ov/Wert re:  RA lung dz  Chief Complaint  Patient presents with   Follow-up    Cough is much improved, but has not resolved yet. Cough is non prod. She has not had to use her neb.   Dyspnea:  Not limited by breathing from desired activities  But some doe x steps Cough: daytime > noct dry  Sleep: fine  SABA use:  No saba Medrol  4 mg a/w 2mg  per day/ ok control of arthritis  rec Start back on gabapentin  up to 300 mg each am  in addition to the the two at bedtime  If not better increase the medrol  to 8 mg daily until bettter then taper back to where you      01/10/2018  f/u ov/Wert re:  RA  Lung dz Medrol   4 mg  One alternating with a half Chief Complaint  Patient presents with   Follow-up    PFT's done. Her breathing has been gradually worsening since the last visit. She has occ cough- non prod.   Dyspnea gradually worse since last ov:  MMRC1 =  MMRC3 = can't walk 100 yards even at a slow pace at a flat grade s stopping due to sob    Gradually x 3 m / more fatigue / no change in arthritis   Cough: not an issue rec Protonix  40 mg Take 30-60 min before first meal of the day  GERD diet   01/17/2018 acute extended ov/Wert re: cough on medrol  4 mg  One a/w one half  Chief Complaint  Patient presents with   Acute Visit    started coughing 01/11/18- occ prod with minimal green sputum.  She states also wheezing and having increased SOB.    abruptly worse 01/11/18 with severe 24/7 coughing >>  prod min green mucus esp in am/ assoc with subjective wheeze and did not follow previous contingencies re flutter / saba/ increase medrol  and admits she does not rember those written instructions nor how to use the neb provided .  No fever/ comfortable at rest sitting  rec For cough > mucinex  dm 1200 mg every 12 hours and cough into the flutter valve as much as possible  Doxycycline  100 mg twice daily x 10 days with glass of water Medrol  4mg  x 2 now and take 2  daily until cough is better then 1 daily x 5 days and then resume the previous dose  Shortness of breath/ wheezing/ still coughing > albuterol   neb every 4 hours as needed      01/20/2018 acute extended ov/Wert re: refractory cough and sob 01/11/18 Chief Complaint  Patient presents with   Acute Visit    she is not feeling better, coughing , very SOB, wheezing  mucus now clear/scant  on doxy/ neb machine not working (tube would not plug into the side s adequate force and she was not capable of applying it due to RA hands. Cough/ wheeze/ sob 24/7 / flutter not helping/ can't lie down at hs   rec While coughing protonix  40 mg Take 30- 60 min before your first and last meals of the day  Shortness of breath/ wheezing/ still coughing > albuterol  neb every 4 hours as needed  Depomedrol 120 mg IM and medrol  32 mg daily x 2 days,  then 16 mg x 3 days,  Then 8 mg x 4 days , then resume the 4 mg daily  For severe cough > tylenol  3# one every 4 hours if needed  Go to ER if condition worsens on above plan       Date of admission: 01/22/2018              Date of discharge: 01/27/2018   History of present illness: As per the H and P dictated on admission, " Stacey [name]  is a 83 y.o. female, w Rheumatoid arthritis, ILD Asthma, apparently c/o increase in dyspnea this evening. Dry cough.   Denies fever, chills, cp, palp,  N/v, diarrhea, brbpr, black stool.   Pt notes recently being given steroid injection in office as well as being placed on doxycycline . This might have helped slightly but pt worse  Hospital Course:  Summary of her active problems in the hospital is as following. 1 dyspnea/hypoxemia/ILD Concerned that likely GERD Is causing ILD. Patient with cough.   Patient with complaints of awakening with cough and also with oral intake which is slightly improved since 01/24/2018. assessed by speech therapy and speech therapy raising concern of esophageal component but no signs of aspiration.   2D echo with a EF of 55 to 60% with no wall motion abnormalities, grade 1 diastolic dysfunction.  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 Pulmonary felt that the patient should be on scheduled Reglan . I have placed the patient on scheduled potassium before sleep. Continue steroids on discharge continue Mucinex  and Claritin  as well as inhalers. Patient will follow-up with pulmonary outpatient   2.  Gastroesophageal reflux disease Continue PPI and H2 blocker.  I changed PPI to AC.   3.  Rheumatoid arthritis Outpatient follow-up.     4.  Anxiety Continue Effexor .       All other chronic medical condition were stable during the hospitalization.  Patient was ambulatory without any assistance. On the day of the discharge the patient's vitals were stable , and no other acute medical condition were reported by patient. the patient was felt safe to be discharge at home with family.   Consultants: PCCM  Procedures: Echocardiogram       03/21/2018  f/u ov/Wert re:   S/p admit was transiently better  and  downhill since Labor day on medrol  4 mg daily  Last orencia  on Sept 4th 2019  Chief Complaint  Patient presents with   Acute Visit    Per patient, she has had a dry cough since July 2019. She has been wheezing as well. Increased fatigue. Body aches. Denies any fever or chills.   Dyspnea:  MMRC4  = sob if tries to leave home or while getting dressed   Cough: harsh/ hacking mostly dry/ has flutter not using    SABA use: not much better with rx   No obvious day to day or daytime variability or assoc excess/ purulent sputum or mucus plugs or hemoptysis or cp or chest tightness, subjective wheeze or overt sinus or hb symptoms.     Also denies any obvious fluctuation of symptoms with weather or environmental changes or other aggravating or alleviating factors except as outlined above   No unusual exposure hx or h/o childhood pna/ asthma or knowledge of premature birth.   INpatient consult 03/26/18 83 year old with rheumatoid arthoritis.At baseline the patient lives at home with her husband and is independent of ADLs.  Has been on many immune suppressants over decades and curently on orencia  x 4 year and prednisone . Does not recollect being on bactrim /dapsone. Chart mentions BOOP/MAI in 2001 but she denies this. 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 Got admitted. Rx with steroids. Per Notes - clnical suspicion of  arytenoid inflmmation related wheeze noticed (she also reports asthma NOS). Follolwup with ENT recommended (but not seen one as yet). She also appears to have passed swallow with rec for regular diet with thin liquids but did to have mild eso stricture and reflux during testing . PFT shows 10% FVC decline for first ime. CT chest at this tme (aug 2019) showed new rLL infiltrate.  ECHO July 2019 without evicence of elevated PASP and saw  cards [facility] June 2019 and was considered to have worsenin dyspnea due to [facility] issues (reports stress test at [facility] that was normal but I cannot see it)   She reports after discharge she got better but in last several weeks has deteriorated with cough and dyspnea. There is new hypoxemia (currently RA with nail polish and poor circulation  - 89% pulse ox) needing 2L [place]. Per [facility] improved with steroids and abx. CTA 03/24/2018 => shows that RLL inifltrate has improved . Other chronic ILD changes + and small  Hiatal hernia + witthout change.   Review of lab work does show eosinophilia at time of admision   EVENTS 03/21/18 - IgE -5, blood allegy panel - negative, 03/23/2018 - - admit . HIGH EOS 2300, ESR 48, BNP 89 , HIV neg 9/12- PCT negative, RVP negative 9/14 -  PCT < 0.1, Urine strep - negative, MRSA PCR - positive. IgE - normal 4, Blood allergy  panel repeat - negative 03/26/2018 - leading consideration for airway (BO in RA +/- asthma) related flare either due to MRSA bronchitis or clinical suspicion of arytenoid inflmmation +/- GERD relatd flare (she has small hiatal hernia)  +/- ? Dysphagia  up causing mild hypoxemia acute resp failure, wheeze . Allergy  and IgE blood work negative thought. Patient reports being better but says she is choking on drinkin water [facility] MD says wheeze improved significantly with steroids.  RN says was down to RA yesterday evening but needed 1L [place] at sleep. Today -Room air at rest 94% and desaturated to 86% walking 60 feet 03/27/2018  - better. Off o2 at rest. STill coughs with water and when lies down.  Husband at bedside. Both requesting ILD clinic followup . Desaturated t 79% walking 90 feet.   OV 04/12/2018  Subjective:  Patient ID: Stacey [name], female , DOB: April 19, 1941 , age 39 y.o. , MRN: [id number] , ADDRESS: [address] [place] [address]   04/12/2018 -   Chief Complaint  Patient presents with   Consult    Pt is a former MW pt.  Pt denies any  current complaints of cough, SOB, or CP but states the cough she originally had ended her up in the hosp 9/11-9/17 with dx acute respiratory failure. Pt does wear 2pulse with exertion and also wears 2L continuous when at home.     HPI MANDOLIN SCARBERRY 83 y.o. -presents for follow-up to the ILD clinic.  She is known to have rheumatoid arthritis with ILD changes.  She had been followed by Dr. Vernestine Gondola.  However in July 2019 in September 2019 she has had 2 admissions to the hospital with respiratory distress and hypoxemic respiratory failure.  In the first 1 that seem to be right lower lobe infiltrate and then she improved from it but in the second 1 even though the right lower lobe infiltrates were better she still was hypoxemic.  Acid reflux and dysphagia was considered a possible etiology but she passed swallow study 2 times.  They thought she had some reflux.  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  At the same time it is not clear if the ILD is progressive based on pulmonary function testing below   At this point in time she tells me that she is getting home physical therapy.  Her fatigue is improving but it is not fully resolved.  She was discharged on continuous oxygen  which she is using.  However she is feeling less short of breath.  Today in fact when we turned her oxygen  off and walked her she did not desaturate and this is a significant improvement.  She is on monthly Orencia  through the [facility] rheumatologist Dr. Erenest Hatchet.  At this point in time she is put the Orencia  on hold.  She told me that she is been on Orencia  for 4 years and never had a respiratory exacerbation still recently x 2.  Although before going on Orencia  she had pneumonia while on Remicade and the Remicade.  In terms of her rheumatoid arthritis she hardly has any pain.  Her joint architecture is fairly well-preserved because of various immunomodulators over time.  She  says that she was on Remicade for years and when she stopped it for 8 weeks before the switch to Orencia  she never really had a relapse in her rheumatoid arthritis.  She is largely pain and stiffness free.  She believes she can go without  her Orencia  for a while.  Review of the literature shows greater than 10% chance of a respiratory infection especially COPD exacerbation.  Although the time frame for this is unclear.       OV 06/07/2018  Subjective:  Patient ID: Stacey [name], female , DOB: 1940/07/19 , age 84 y.o. , MRN: [id number] , ADDRESS: [address] [place] [address]   06/07/2018 -   Chief Complaint  Patient presents with   Follow-up    ILD, PFT done today, some wheezing and coughing but better tha before   Rheumatoid arthritis ILD and asthma/obstructive lung disease phenotype on Dulera   HPI SEMAJE ROETHER 83 y.o. -presents for routine follow-up with her husband.  She is here to follow-up with [facility] Dr. Hiram Lukes.  She plans to do this in  December 2019.  She continues to be off Orencia .  Her joints are slowly getting stiff again.  She believes that she will need to be back on immunosuppression agent again.  She currently continues Medrol  4 mg alternating with 2 mg.  This for her rheumatoid arthritis.  In terms of her joints she continues on Medrol  4 mg alternating with 2 mg but not on any other immunosuppression agent.  Overall she is been stable but for the last 2 weeks has had green sputum and wheezing and chest congestion and cough.  She recently visited her husband who was hospitalized and walking the long hallways at [facility] made a short of breath but she thinks this is probably baseline for her.  There are no other new issues.  She did have spirometry and DLCO and this shows a decline compared to September 2019 and a similar to July 2019.  It is documented below.  This is probably reflective of a flareup   OV 07/21/2018  Subjective:   Patient ID: Stacey [name], female , DOB: 04-10-1941 , age 5 y.o. , MRN: [id number] , ADDRESS: [address] [place] [address]   07/21/2018 -   Chief Complaint  Patient presents with   Follow-up    Pt states she has been doing well since last visit. States she is about to begin Rituxan  with [facility]. Pt still becomes SOB with exertion. Denies any complaints of cough or CP.   Rheumatoid arthritis ILD and asthma/obstructive lung disease phenotype on Dulera   HPI AKALA LAFFITTE 83 y.o. -presents for follow-up of the above.  Last seen just before Thanksgiving 2019.  In the interim overall stable although on June 23, 2018 she climbs a steep flight of stairs which is unusual exertion for her and she became very dyspneic.  Following day saw Dr. Hiram Lukes at [facility] rheumatology and was given Z-Pak and prednisone  and started feeling better.  Although it is not fully clear to me she had fever and bronchitic symptoms.  I reviewed Dr. Hiram Lukes note.  Dr. Hiram Lukes is decided to start Rituxan  for rheumatoid arthritis.  She is only having some minimal joint pain at this point.  She is off Orencia  and continues to be off Orencia .  She did have some blood work with us  before starting Rituxan .  She is due to see Dr. Hiram Lukes within the next week and start her Rituxan .  Her liver function test July 18, 2017 is normal hemoglobin is normal.  CRP is also normal.  We did spirometry and walking desaturation test.  These show improvement compared to before and these are documented below.  Currently she not using nighttime or daytime oxygen .  She is wondering if she could switch rheumatology care to [facility].  This is because while she likes [employer] she is getting older and more frail and feels some body local would be of help.  I have sent a message to Dr. Alvira Josephs inquiring.  Certainly we can help her with Rituxan  infusions at [facility] health system if needed.  She will check on this with  her [facility] rheumatologist.       OV 03/27/2019  Subjective:  Patient ID: Stacey [name], female , DOB: [birth year] , age 46 y.o. , MRN: [id number] , ADDRESS: [address] [place] [address]  Rheumatoid arthritis ILD and asthma/obstructive lung disease phenotype on Dulera    03/27/2019 -  Rourine fu   HPI AVANELLE RITCH 83 y.o. -presents for  the above.  Last seen in January 2020.  After that she has seen Dr. Hiram Lukes rheumatology at [facility].  She is now getting Rituxan  2 doses every 6 months.  She says this is helped her joints and her stiffness.  She is a little bit more mobile than usual.  However in terms of her respiratory status she continues to have episodic cough.  In June 2020 she again got hypoxemic and got admitted.  Since then she has had episodic cough.  She had a respiratory exacerbation in June 2020 for the admission she got steroids.  This seemed to help.  She is also on a higher dose Dulera  right now.  In terms of her cough this seems to be her biggest problem.  She seems to be on Dulera , Singulair  scheduled with also Tessalon  and Delsym  and DuoNeb.  Noticed that she is on gabapentin  Elavil  and Effexor  but I think this is all from neuropathy and other issues and not primarily indicated for cough.  Her last high-resolution CT scan of the chest was 1 year ago.  She says the dyspnea itself is not worse.  She is currently not using oxygen .  On exam she did have some wheezing.  Currently she has white and brown sputum but this is baseline.  In terms of a COVID wrist she has been tested recently couple of times and this is been negative.  She is isolating well.  She wanted to know COVID prevention activities and risk status and masking strategies.      OV 05/24/2019  Subjective:  Patient ID: Stacey [name], female , DOB: [birth year] , age 9 y.o. , MRN: [id number] , ADDRESS: [address] [place] [address]   05/24/2019 -   Chief Complaint  Patient  presents with   Follow-up    Pt was recently in the hosp due to ILD. Pt states that she has been better since being out of the hosp.   Rheumatoid arthritis ILD and asthma/obstructive lung disease phenotype on Dulera /Singulair   Post herpetic neuralgia - on elavil , gabapentin , effexor   HPI EVELINA MCMACKIN 83 y.o. -returns for follow-up.  At the last visit approximately 2 months ago she was reporting worsening cough following an admission in summer 2020.  Her pulmonary function test suggested worsening ILD status.  Therefore we requested a high-resolution CT chest which she did in October 2020.  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  However in the interim after the CT scan was done towards the end of October 2020 she developed worsening of her cough over 2 weeks and also associated shortness of breath but significantly the cough is much worse.  She ended up getting admitted to the hospital.  There was some hypoxemia.  By this time she had finished a ENT evaluation that did not show any involvement of the arytenoids.  Pulmonary was consulted.  She was given a prednisone  burst which she just finished I believe yesterday.  She is back on her baseline Medrol .  And she is feeling better.  Her oxygen  status is improved although she is using oxygen  at night now.  She is really frustrated with these recurrent flareups and these admissions which ended up with her having a wheeze and also hypoxemia.  Currently she is on her baseline Medrol  for rheumatoid arthritis associated with Dulera  and Singulair .  She is also on losartan  for blood pressure.  Her walking desaturation test is slightly worse than baseline.  She has a GI consult pending because of the recurrent episodes of cough and flareups.  We went over exposure history.  We used interstitial lung disease questionnaire for the exposure history.  Specifically she denies any electronic cigarette use of marijuana use of cocaine  use or any IV drug abuse.  [place] the last 14 years.  Asked extensive questions about the home environment it is positive for nebulizer use but the nebulizer does not have mildew or mold in it.  Otherwise no organic antigen exposure.  The house is not damp.  There is no mold or mildew in the shower curtain.  There is no humidifier use no steam iron use.  No Jacuzzi use.  No misting [place] outside to inside the house.  No pet birds.  No pet gerbils no feather pillows.  There is no mold in the [facility] duct.  She does not do any gardening.  Does not use wind instruments.  Also 122 question occupational history elicited and essentially negative.  The other issue is that she has polypharmacy.  She is asking for my help in reducing her medications.  She is on 3 medications for postherpetic neuralgia.  She is on losartan       OV 06/21/2019  Subjective:  Patient ID: Stacey [name], female , DOB: 06-Apr-1941 , age 46 y.o. , MRN: [id number] , ADDRESS: [address] [place] [address]   06/21/2019 -   Chief Complaint  Patient presents with   televisit    hosp 11/29-12/4 due to covid with pna. pt said that she is doing okay after recent hosp but states she has no energy.      BREIANNA GUZZO 83 y.o. - last visit 05/24/2019. Diagnosed with covid-19 on 05/31/2019. Admitted 06/11/2019  - 06/16/2019. Quarantine ends 06/21/2019 today. Rx with Remdesviri and Decadron . Husband also on phone. Questions  1. Quarantine ends - from 06/22/19\ and she is not contagious and likely resistant to reinfection to covid for another few months  2. She is on 10 day dexamethasone  for covid - today is last day for it  3. She is on dulera . Hospital gave combivent  and she does not want do combivent  - this is fine  4. Ofev  for ILD - not started it yet . She had questions about side effects. Explained GI and LFT monirtong. SHe wanted to wait till Jan 2021 and  start it . I am ok with that.   5. Small hiat   IMPRESSION: HRCT OCt 2020 1. 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 Findings have progressed since 2017 and 2019 high-resolution chest CT studies. 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 Findings are consistent with UIP per consensus guidelines: Diagnosis of Idiopathic Pulmonary Fibrosis: An Official ATS/ERS/JRS/ALAT Clinical Practice Guideline. Am Annie Barton Crit Care Med Vol 198, Iss 5, [phone], Mar 13 2017. 2. 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 4. Small hiatal hernia.   Aortic Atherosclerosis (ICD10-I70.0).     Electronically Signed   By: Levell Reach M.D.   On: 04/24/2019 13:29    OV 08/03/2019 - face to face visit  Subjective:  Patient ID: Stacey [name], female , DOB: 08-15-1940 , age 15 y.o. , MRN: [id number] , ADDRESS: [address] [place] [address]    Rheumatoid arthritis with Joint s/ILD- Rituxan /steroids ([facility])  - progressive phenotype (CT 2017/2019 -> Oct 2020). Planned Ofev  start in Nov 2020 but did not start as of dec 2020. LFT normal Jan 2021  Athma/obstructive lung disease phenotype on Dulera /Singulair   Post herpetic neuralgia - on elavil , gabapentin , effexor   Coronary Artery Calcification - 1 vessel - cardiology referral done  Diagnosed with covid-19 on 05/31/2019. Admitted 06/11/2019  - 06/16/2019. Rx with Remdesviri and Decadron .  Small Hiatal hernia onCT Oct 2020  Rib fracture from fall Dec 2020: Musculoskeletal: There is nondisplaced fractures of the posterior left fifth, 6, 8, 9, and tenth left ribs. There is unchanged slight superior compression deformities of the T5, T6, T10 vertebral bodies with less than 25% loss in height.Associated pulmnary contusion +    HPI VONTELLA PATSY 83  y.o. -presents for a visit. Her nintedanib  has been delayed because of COVID-19. Also subsequently a fall. She saw a nurse practitioner on July 20, 2019 and they took a shared decision to start nintedanib . She had a ? telephone visit with Dr. Gerlean Kocher the rheumatologist at [facility]. Patient scheduled for her next Rituxan  in February 2021 but reviewed the notes indicates this could be pushed to early spring 2021. Dr. Hiram Lukes is okay with the patient study got intubated at this point in time.  Patient has follow-up with Dr. Hiram Lukes tomorrow at [facility].  At this face-to-face visit she is here with her husband.  She says she is much better after the Covid and also the fall that fractured her ribs.  Nevertheless all this is left her fatigue.  Infective fatigue scores are much worse.  Also in the last 2 weeks has had increased cough wheezing and shortness of breath.  The sputum was also changed color in the last 1 week to clear green.  This is all new.  Her symptom score is therefore a worse than her baseline.  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  She is only taking 100 mg once a day.  The plan was to go up to 100 mg twice a day next week.  This is a minimum effective dose.  The maximum dose is 150 mg twice a day.  Given her age and comorbidities we are starting at a low dose.  Part of this visit is to make sure that she is tolerating the drug fine.  And so far she is.  She is interested in the ILD-pro registry study done by the [facility].   She is working with physical therapy for her fatigue.  Ambulatory Walk 07/20/2019 2 Lap- O2 92% RA; HR 109 No shortness of breath She did not need to stop Used walker    OV 09/20/2019  Subjective:  Patient ID: Stacey [name], female , DOB: 26-Aug-1940 , age 12 y.o. , MRN: [id number] , ADDRESS: [address] [place]  [address]   09/20/2019 -   Chief Complaint  Patient presents with   Televisit    Called and spoke with pt who stated she has been feeling okay since last visit. Pt stated she is still taking OFEV  and denies any complaints. Pt states she believes her breathing is stable at this point.    Rheumatoid arthritis with Joint s/ILD- Rituxan /steroids (duke uinviesty)  - progressive phenotype (CT 2017/2019 -> Oct 2020). Planned Ofev  start in Nov 2020 but did not start as of dec 2020. 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 Admitted 06/11/2019  - 06/16/2019. Rx with Remdesviri and Decadron .  Small Hiatal hernia on CT Oct 2020  Rib fracture from fall Dec 2020: Musculoskeletal: There is nondisplaced fractures of the posterior left fifth, 6, 8, 9, and tenth left ribs. There is unchanged slight superior compression deformities of the T5, T6, T10 vertebral bodies with less than 25% loss in height.Associated pulmnary contusion +   HPI CHINMAYI MULLIS 83 y.o. - has 2nd covid vaccine later this week. Rituxan  is end of the month. Baseline RA regiment is On dulera  for associated obstructio of lung. Continue ofev  100mg  bid since mid jan 2021 for ILD. No side effects. Did see Irby Mannan for face to face visit early feb 2021 -> for bronchitis and felt better after prdnisone and zpak. Currently on medrol    8 mg day and staying there per Irby Mannan . Wants t oknow if she can taper. Wants to know how she can prevent care flare ups. Explained ofev , masking and social distancing prevent respiratory  infection.Denies choking on food or aspirating. Denies mold in house - relatively news. Denies dog. Denies cat. Denies carious teeth. Denies post nasal drip  OVerll feels better and improved dyspnea.     OV 12/18/2019  Subjective:  Patient ID: Stacey [name], female , DOB: 02-09-1941 , age 68 y.o. , MRN: [id number] , ADDRESS: [address] [place] [address]  PCP Margarete Sharps, MD Rheumatologist-Dr. Erenest Hatchet at [facility]/ILD: Dr. Michelene Ahmadi  12/18/2019 -   Chief Complaint  Patient presents with   Follow-up    no worse   Rheumatoid arthritis with Joint s/ILD- Rituxan /steroids (duke uinviesty)  - progressive phenotype (CT 2017/2019 -> Oct 2020). Planned Ofev  start in Nov 2020 but did not start as of dec 2020. 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 Admitted 06/11/2019  - 06/16/2019. Rx with Remdesviri and Decadron .  Small Hiatal hernia on CT Oct 2020  Rib fracture from fall Dec 2020: Musculoskeletal: There is nondisplaced fractures of the posterior left fifth, 6, 8, 9, and tenth left ribs. There is unchanged slight superior compression deformities of the T5, T6, T10 vertebral bodies with less than 25% loss in height.Associated pulmnary contusion +  -On Reclast  once a year through Dr. Erenest Hatchet   HPI Stacey [name] 83 y.o. 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  It has been a few month since I last saw her.  In this time she did not taper her Medrol  down.  She stated 8 mg/day.   She was  originally taking the Medrol  for her rheumatoid arthritis for many years.  She recently increased the dose from 4 mg / 2 mg every other day to 8 mg daily because of recurrent respiratory exacerbations.  She is asking for Dulera  refills for obstructive lung disease.  She is getting Rituxan  through Dr. Erenest Hatchet for rheumatoid arthritis.  Her next Rituxan  is in September 2021.  She saw Dr. Hiram Lukes in April 2021 and reviewed the note.  Her liver function test at the time was fine.  In terms of her ILD she continues on nintedanib  100 mg twice daily.  She says she has no side effects from the drug she is tolerating it quite well.  We discussed about increasing the dose but she wants to hold off because she just got a new supply.  However she is open to increasing the dose when the supply runs out.  She is due for liver function test today.  In terms of overall symptoms things have improved as can be seen below and the symptom score.Aaron Aas  However her main concern is that of fatigue.  She is open to attending pulmonary rehabilitation.  She is not using oxygen  at home.  Sometime back when we tested overnight oxygen  desaturation test she did not desaturate.  Walking desaturation test today stable.  She does notice of note that her blood pressure has gone up.  She is working with her primary care physician on this.  She is on losartan .    OV 04/10/2020   Subjective:  Patient ID: Stacey [name], female , DOB: 1941/02/07, age 8 y.o. years. , MRN: [id number],  ADDRESS: [address] [place] [address] PCP  Margarete Sharps, MD Providers : Treatment Team:  Attending Provider: Maire Scot, MD   Chief Complaint  Patient presents with   Follow-up    CT scan 8/23, has not increased Ofev , shortness of breath with exertion.    Rheumatoid arthritis with Joint s/ILD- Rituxan /steroids (duke uinviesty)  - progressive phenotype (CT 2017/2019 -> Oct 2020). Planned Ofev  start in Nov 2020 but did  not start as of dec 2020. 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 Admitted 06/11/2019  - 06/16/2019. Rx with Remdesviri and Decadron .  Admitted August 2021 for respiratory infection not otherwise specified.  Following trip to [place] .  Was hypoxemic.  Small Hiatal hernia on CT Oct 2020  Rib fracture from fall Dec 2020: Musculoskeletal: There is nondisplaced fractures of the posterior left fifth, 6, 8, 9, and tenth left ribs. There is unchanged slight superior compression deformities of the T5, T6, T10 vertebral bodies with less than 25% loss in height.Associated pulmnary contusion +  -On Reclast  once a year through Dr. Erenest Hatchet    HPI Stacey [name] 83 y.o. -returns for follow-up.  Last week by myself early June 2021.  Then after that in early August 2020 when she got admitted for respiratory infection not otherwise specified.  She was hypoxemic on room  air.  This followed a trip to [place] .  In the follow-up phase end of August 2020 when she had high-resolution CT chest that showed increased groundglass opacities but when compared to a CT from 8 months ago.  Also chronic ILD had worsened.  I personally visualized the image at this point in time she is improved from this admission she is not using oxygen  at all.  In fact walking desaturation test shows near baseline.  Nevertheless overall her symptom scores have declined over time ILD symptom score is listed below.  She is really  concerned about several issues.  In terms of her ILD she was inquiring about escalating to 150 mg twice daily.  Even at 100 mg twice daily she is significantly symptomatic in terms of nonrespiratory issues such as nausea and dizziness and fatigue.  I did caution her that these could all get worse.  Therefore she is just opted to be at 100 mg twice daily  -Recurrent admissions for respiratory issues [I did address that some of this was Covid and fall related and not necessarily her usual summer exacerbation].  Nevertheless, I asked about exposures at home.  She does not have a feather blanket or feather pillow or feather jacket.  She denies any mold or bird exposure or mildew exposure at home.  Does not do any gardening or wind instruments.  She is immunosuppressed and I did notice that she is not on Bactrim  for PCP prophylaxis.  In 2019 when we checked G6PD the lab could not process this lab.  She is interested in Bactrim  prophylaxis.  She is interested in okay getting G6PD rechecked   -She is dealing with significant amount of dizziness.  Neurology is sending her to neuro rehab.  It is believed it is multifactorial.  I reviewed her recent CT head angio report.  I personally visualized the image   -She is also worried about cough.  Currently cough is tolerable but she says prior to her exacerbations cough gets worse.  We did discuss with her that if she is aspirating which she denied.  We did discuss about the possibility of starting Bactrim  and monitoring her cough and she is fine with that.  I did review old records Lungs/Pleura: Biapical pleuroparenchymal scarring. Patchy and largely peripheral peribronchovascular ground-glass, increased from 07/03/2019. Underlying subpleural reticulation, traction bronchiectasis/bronchiolectasis and scattered honeycombing, similar to minimally increased from 07/03/2019. Calcified granulomas. No pleural fluid. Airway is unremarkable. No air trapping.   Upper  Abdomen: Visualized portions of the liver, adrenal glands, kidneys, spleen, pancreas, stomach and bowel are unremarkable with the exception of a small hiatal hernia. Cholecystectomy. Calcified upper abdominal lymph nodes.   Musculoskeletal: Degenerative changes in the spine. No worrisome lytic or sclerotic lesions.   IMPRESSION: 1. Increased patchy pulmonary parenchymal ground-glass may be due to an atypical/viral pneumonia, including due to COVID-19. 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 2. Aortic atherosclerosis (ICD10-I70.0). Coronary artery calcification.     Electronically Signed   By: Shearon [name] M.D.   On: 03/04/2020 12:58    OV 08/01/2020  Subjective:  Patient ID: Stacey [name], female , DOB: 09/13/1940 , age 40 y.o. , MRN: [id number] , ADDRESS: [address] [place] [address] PCP Margarete Sharps, MD Patient Care Team: Margarete Sharps, MD as PCP - General (Internal Medicine) Sheryle Donning, MD as PCP - Cardiology (Cardiology)  This Provider for this visit: Treatment Team:  Attending Provider: Maire Scot, MD   Rheumatoid arthritis with Joint s/ILD- Rituxan [place] (duke uinviesty)  - progressive phenotype (CT 2017/2019 -> Oct 2020). Planned Ofev  start in Nov 2020 but did not start as of dec 2020. 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 Admitted 06/11/2019  - 06/16/2019. Rx with Remdesviri and Decadron .  Admitted August 2021 for respiratory infection not otherwise specified.  Following trip to [place] .  Was hypoxemic.  Small Hiatal hernia on CT Oct 2020  Rib fracture from fall Dec 2020: Musculoskeletal: There is nondisplaced fractures of the posterior left fifth, 6, 8, 9, and tenth left ribs. There is unchanged slight superior compression deformities of the T5, T6, T10 vertebral bodies with less than 25% loss in height.Associated pulmnary contusion +  -On Reclast  once a year through Dr. Erenest Hatchet    08/01/2020 -   Chief Complaint  Patient presents with   Follow-up    Doing well     HPI SHARRI PARRADO 83 y.o. -returns for follow-up.  She says since last visit she has lost weight.  In fact tracking her weight it appears she is definitely lost weight.  She has occasional intermittent nausea once every few weeks this is because she does not time her nintedanib  well with food.  She has a low appetite as well.  She tried to go up on the Internet but she is unable to.  She takes a low-dose 100 mg twice daily.  She is on Rituxan  and prednisone .  She is up-to-date with her COVID-vaccine.  Current shortness of breath standpoint she is stable.  Symptoms are stable.  CT Chest data  OV 10/22/2020  Subjective:  Patient ID: Stacey [name], female , DOB: 11-10-1940 , age 89 y.o. , MRN: [id number] , ADDRESS: [address] [place] [address] PCP Margarete Sharps, MD Patient Care Team: Margarete Sharps, MD as PCP - General (Internal Medicine) Sheryle Donning, MD as PCP - Cardiology (Cardiology)  This Provider for this visit: Treatment Team:  Attending Provider: Maire Scot, MD    10/22/2020 -   Chief Complaint  Patient presents with   Follow-up    Still having shortness of breath with activity, denies increase in  severity.     Rheumatoid arthritis with Joint s/ILD- Rituxan /steroids (duke uinviesty)  - progressive phenotype (CT 2017/2019 -> Oct 2020). Planned Ofev  start in Nov 2020 but did not start as of dec 2020. 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 Admitted 06/11/2019  - 06/16/2019. Rx with Remdesviri and Decadron .  - sp evushed x 2  Admitted August 2021 for respiratory infection not otherwise specified.  Following trip to [place] .  Was hypoxemic.  Small Hiatal hernia on CT Oct 2020  Rib fracture from fall Dec 2020: Musculoskeletal: There is nondisplaced fractures of the posterior left fifth, 6, 8, 9, and tenth left ribs. There is unchanged slight superior compression deformities of the T5, T6, T10 vertebral bodies with less than 25% loss in height.Associated pulmnary contusion +  -On Reclast  once a year through Dr. Erenest Hatchet  Normal ECHO Summer 2020   HPI CHELCIE NIEDZIELSKI 84 y.o. -returns for follow-up.  At this point in time for her ILD she is taking nintedanib  100 mg twice daily.  Last liver function test was in December 2021.  She is now attending pulmonary rehabilitation.  She is not sure it is helping her.  She tells me that she does not desaturate at pulmonary rehabilitation so she not using her oxygen .  Review of the records indicate same but it appears that today her dyspnea is worse in terms of symptom score although she is denying that.  Walking desaturation test in the  office today with a forehead probe suggest that pulse ox is declined.  Her pulmonary function test also shows 5% FVC decline.  She was surprised by this.  In talking to her realize that for coexistent obstructive lung disease she is no longer taking Dulera .  She is willing to take inhaler which she will have to twist instead of having to pump with her fingers because she has rheumatoid arthritis.  She is no longer losing weight though.  She had questions about taking monoclonal antibody prophylaxis.  She is under the impression she needs to take it every 6 months with Rituxan  dosing.  I did explain to her that in the presence of Rituxan  the body is not able to respond actively to vaccine and that is why she is having prophylactic antibody.  She is now had 2 doses the last 1 being a few weeks ago first 1 was in January 2022.  I have written to the [facility] coordinator Alwin Baars 27 monoclonal antibody prophylaxis schedule.  Expressed to patient that because of monoclonal antibody prophylaxis she did not do the vaccine.  Last echocardiogram was in summer 2020.      OV 12/23/2020  Subjective:  Patient ID: Stacey [name], female , DOB: [birth year] , age 7 y.o. , MRN: [id number] , ADDRESS: [address] [place] [address] PCP Margarete Sharps, MD Patient Care Team: Margarete Sharps, MD as PCP - General (Internal Medicine) Sheryle Donning, MD as PCP - Cardiology (Cardiology)  This Provider for this visit: Treatment Team:  Attending Provider: Maire Scot, MD   12/23/2020 -   Chief Complaint  Patient presents with   Follow-up    PFT performed today. Pt states she has been doing okay since last visit and denies any complaints.     HPI GIONNI KONDRACKI 83 y.o. -returns for follow-up.  There is concern for progression.  Last visit we have made sure she added Breo.  She says she feels better.  In fact symptom score is better.  However pulmonary function test shows continued  decline.  She is on the low-dose nintedanib .  She says if she does not take the nintedanib  exactly with food and even if  she takes it 15 to 20 minutes later she will have nausea.  Today she had nausea but otherwise if she is diligent she can maintain.  I informed her the pulmonary function test shows continued decline.  She is on Rituxan  prednisone  and low-dose nintedanib .  She initially said she will not be able to tolerate the higher dose nintedanib  but after realizing that her nausea is mild and her weight loss is stable she says she is willing to give the higher dose nintedanib  and attend.  I was able to get her some donor sample of nintedanib .  The lot number of this is [id number].  Expiration date is November 2023.  The serial number is [id number].  The GT IN number is [id number]       ECHO 11/3120  IMPRESSIONS     1. Left ventricular ejection fraction, by estimation, is 65 to 70%. The  left ventricle has normal function. The left ventricle has no regional  wall motion abnormalities. Left ventricular diastolic parameters are  consistent with Grade I diastolic  dysfunction (impaired relaxation). Elevated left ventricular end-diastolic  pressure. The E/e' is 48. The average left ventricular global longitudinal  strain is -18.4 %.   2. Right ventricular systolic function is normal. The right ventricular  size is normal. There is mildly elevated pulmonary artery systolic  pressure. The estimated right ventricular systolic pressure is 36.9 mmHg.   3. The mitral valve is abnormal. Mild mitral valve regurgitation.   4. The aortic valve is tricuspid. Aortic valve regurgitation is not  visualized. Mild aortic valve sclerosis is present, with no evidence of  aortic valve stenosis.   5. The inferior vena cava is normal in size with greater than 50%  respiratory variability, suggesting right atrial pressure of 3 mmHg.   Comparison(s): Changes from prior study are noted. 12/18/18 EF 60-65%.  PA  pressure .    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  OV 03/10/2021  Subjective:  Patient ID: Stacey [name], female , DOB: March 09, 1941 , age 43 y.o. , MRN: [id number] , ADDRESS: [address] [place] [address] PCP Margarete Sharps, MD Patient Care Team: Margarete Sharps, MD as PCP - General (Internal Medicine) Sheryle Donning, MD as PCP - Cardiology (Cardiology)  This Provider for this visit: Treatment Team:  Attending Provider: Maire Scot, MD    03/10/2021 -   Chief Complaint  Patient presents with   Follow-up    Pt states she has questions about ofev  she currently on it but having side effects.    Rheumatoid arthritis with Joint s/ILD- Rituxan /steroids (duke uinviesty)  - progressive phenotype (CT 2017/2019 -> Oct 2020). Planned Ofev  start in Nov 2020 but did not start as of dec 2020. 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 Admitted 06/11/2019  - 06/16/2019. Rx with Remdesviri and Decadron .  - sp evushed x 2  - 2nd covid by hx June 2022 - evusheld repeat Oct 2022  Admitted August 2021 for  respiratory infection not otherwise specified.  Following trip to [place] .  Was hypoxemic.  Small Hiatal hernia on CT Oct 2020  Rib fracture from fall Dec 2020: Musculoskeletal: There is nondisplaced fractures of the posterior left fifth, 6, 8, 9, and tenth left ribs. There is unchanged slight superior compression deformities of the T5, T6, T10 vertebral bodies with less than 25% loss in height.Associated pulmnary contusion +  -On Reclast  once a year through Dr. 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  -returns for follow-up.  She presents with her husband Lin Rend.  Last visit she wanted to increase the nintedanib  to 150 mg twice daily but this is made her have more nausea and vomiting.  The diarrhea is very mild.  She then reduced her nintedanib  200 mg twice daily but still having side effects.  She is now started losing weight again.  She is really frustrated by this.  Her husband asked about pirfenidone.  Explained that the research with progressive pulmonary fibrosis is limited in the case of pirfenidone so nintedanib  is the first choice.  Did agree that if nintedanib  did not work we would try pirfenidone.  She wanted know what her options were.  We discussed about stopping nintedanib  and improving quality of life but face the risk of continued progression pulmonary fibrosis.  She was not that enthused about this choice.  We discussed about trying Zofran  with the nintedanib .  She seemed more receptive to this idea.  We decided that we will try with 100 mg twice daily at the lower dose and then see.  She believes asthma is under control.  Her pulmonary function test shown below seems to fluctuate.  Overall symptom severity stable.  She wanted a handicap placard signed today which I did.       OV 04/08/2021  Subjective:  Patient ID: Stacey [name], female , DOB: 03/26/1941 , age 62 y.o. , MRN: [id number] , ADDRESS: [address] [place] [address] PCP Margarete Sharps, MD Patient Care Team: Margarete Sharps, MD as PCP - General (Internal Medicine) Sheryle Donning, MD as PCP - Cardiology (Cardiology)  This Provider for this visit: Treatment Team:  Attending Provider: Maire Scot, MD    04/08/2021 -   Chief Complaint  Patient presents with   Follow-up    Pt states she has been doing okay since last visit and denies any real complaints. States breathing has been doing okay and states the nausea went away.    Rheumatoid arthritis with Joint s/ILD- Rituxan /steroids ([facility]ke uinviesty)  - progressive phenotype (CT 2017/2019 -> Oct 2020). Planned Ofev  start in Nov 2020 but did not start as of dec 2020. LFT normal Jan 2021   - Rituxan  via [facility] -September 2021 + Medrol  4mg /2mg  QOD baseline many decades (on 8mg  per day since feb 2021 due to recurrent asthma flares)  - nintedanib  for her ILD she started it on July 31, 2019 - started 100mg  twice daily (started low dose du eto side effect concern) -> increased to 150mg  bid d June 2022 -> reduced to 100mg  bid aug 2022 due tow eight loss  -  ILD PRO registry study - since 08/16/19  Athma/obstructive lung disease phenotype on Dulera /Singulair   Post herpetic neuralgia - on elavil , gabapentin , effexor   Coronary Artery Calcification - 1 vessel -  S/p - covid-19 on 05/31/2019. Admitted 06/11/2019  - 06/16/2019. Rx with Remdesviri and Decadron .  - sp evushed x 2  Admitted August 2021 for respiratory infection not otherwise specified.  Following trip to [place] .  Was hypoxemic.  Small Hiatal hernia on CT Oct 2020  Rib fracture from fall Dec 2020: Musculoskeletal: There is nondisplaced fractures of the posterior left fifth, 6, 8, 9, and tenth left ribs. There is unchanged slight superior compression deformities of the T5, T6, T10 vertebral bodies with less than 25% loss in height.Associated pulmnary contusion +  -On Reclast  once a year  through Dr. Erenest Hatchet  Normal ECHO Summer 2020 -> possible [facility] May 2021 RSVP 30s with GR1 ddx  HPI MARICARMEN SHALA 83 y.o. 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  Last seen 4 weeks ago.  At that time she was losing weight and having significant GI side effects with nintedanib  full dose.  Told her to reduce the nintedanib  dose to 100 mg twice daily and also take Zofran  as needed.  She is taking Zofran  as needed.  She is tolerating the nintedanib  low-dose much better.  Symptom scores are listed below.  Overall she feels better.  Weight loss is stabilized.  Infectious gained 1 pound of weight.  She feels that she will just take with nintedanib  100 mg twice daily using Zofran  as needed.  She does not want to change the schedule.  I fully agree with her and supported on that.  She is aware that the low-dose is less effective but this might be best balance between risk and benefit.  She did ask about getting a COVID by Valent mRNA booster 2 oh micron and BA.5.  According to history she has had COVID x2.  Most recently was in June 2022 which would be against the current viral strain.  In addition she is got monoclonal antibody prophylaxis euvsheld in past.  She does socially isolate herself to the extent possible and masks.  She is going to get the monoclonal antibody prophylaxis again in a few weeks when she gets her Rituxan .  We did discuss the fact that because she is on Rituxan  she is immunosuppressed and does not make antibodies and then the facet antibody transfer that she gets through [facility] is risk protective.  Additional risk protective feature is the fact that she had a current strain of COVID-19 and potentially has natural immunity.  Therefore it is her call to get the bivaet booster although she wanted to avoid it and I supported this in a shared decision making she could hold off till at least January 2023 when it will be 6  months since her natural infection.     OV 07/18/2021  Subjective:  Patient ID: Stacey [name], female , DOB: 1941/02/08 , age 83 y.o. , MRN: [id number] , ADDRESS: [address] [place] [address] PCP Margarete Sharps, MD Patient Care Team: Margarete Sharps, MD as PCP - General (Internal Medicine) Sheryle Donning, MD as PCP - Cardiology (Cardiology)  This Provider for this visit: Treatment Team:  Attending Provider: Maire Scot, MD    07/18/2021 -   Chief Complaint  Patient presents with   Follow-up    Pt states she has been doing okay since last visit. States  her breathing is about the same.    Rheumatoid arthritis with Joint s/ILD- Rituxan /steroids ([facility] uinviesty)  - progressive phenotype (CT 2017/2019 -> Oct 2020). Planned Ofev  start in Nov 2020 but did not start as of dec 2020. LFT normal Jan 2021   - Rituxan  via [facility] -September 2021 + Medrol  4mg /2mg  QOD baseline many decades (on 8mg  per day since feb 2021 due to recurrent asthma flares)  - nintedanib  for her ILD she started it on July 31, 2019 - started 100mg  twice daily (started low dose du eto side effect concern) -> increased to 150mg  bid d June 2022 -> reduced to 100mg  bid aug 2022 due tow eight loss  - ILD PRO registry study - since 08/16/19  Athma/obstructive lung disease phenotype on Dulera /Singulair   Post herpetic neuralgia - on elavil , gabapentin , effexor   Coronary Artery Calcification - 1 vessel -  S/p - covid-19 on 05/31/2019. Admitted 06/11/2019  - 06/16/2019. Rx with Remdesviri and Decadron .  - sp evushed x 2  Admitted August 2021 for respiratory infection not otherwise specified.  Following trip to [place] .  Was hypoxemic.  Small Hiatal hernia on CT Oct 2020  Rib fracture from fall Dec 2020: Musculoskeletal: There is nondisplaced fractures of the posterior left fifth, 6, 8, 9, and tenth left ribs. There is unchanged slight superior compression deformities of the T5, T6,  T10 vertebral bodies with less than 25% loss in height.Associated pulmnary contusion +  -On Reclast  once a year through Dr. Erenest Hatchet  Normal ECHO Summer 2020 -> possible [facility] May 2021 RSVP 30s with GR1 ddx   xxxx HPI DALA NOLTON 83 y.o. -presents for follow-up.  Last visit September 2022.  Since then she is doing stable.  With a lower dose of nintedanib  no more diarrhea.  Weight loss is stopped.  Barely any nausea.  She is not needing Zofran  for nausea control.  Her main concern right now is that she is having imbalance issues.  She is stopped working with the physical therapy.  She thinks it is 1 of proprioception but also long-term muscle loss and sarcopenia.  I have advised her to take this with her primary care/rheumatology.  Terms of asthma is stable on Breo and Singulair .  Last liver function test April 2022 in our chart.  Latest pulmonary function test shows stability    CT Chest data  No results found.            OV 03/06/2022  Subjective:  Patient ID: Stacey [name], female , DOB: 1940/09/12 , age 89 y.o. , MRN: [id number] , ADDRESS: [address] [place] [address] PCP Margarete Sharps, MD Patient Care Team: Margarete Sharps, MD as PCP - General (Internal Medicine) Sheryle Donning, MD as PCP - Cardiology (Cardiology)  This Provider for this visit: Treatment Team:  Attending Provider: Maire Scot, MD    03/06/2022 -   Chief Complaint  Patient presents with   Follow-up    PFT performed today.  Pt states she has been doing okay since last visit. States her breathing has been doing okay but states she has been having some problems with balance.    HPI AMBERNICOLE USREY 83 y.o. -returns for follow-up.  Last seen in January 2023.  She says that from a breathing standpoint she is stable but she has had balance issues.  She has had 2 accidental falls.  The first 1 was in March 2023 while entering the house along the steps and she landed  in the bushes and had 25 stitches to her left lower extremity.  She also had collarbone fracture.  Then again a fall in the driveway 2 months ago but with just bruising.  She is now going to undergo physical therapy.  ENT evaluation did not show any ENT causes for the fall.  In terms of her dyspnea and walking desaturation test in the office she is stable but her pulmonary function test shows continued decline.  She is on nintedanib  at the lower dose because she is not able to tolerate the higher dose.  On this lower dose she still requiring Zofran  for control of nausea.  She takes Zofran  preemptively.  There are no other issues.  The last echo and CT scan was over a year ago.     OV 12/11/2022  Subjective:  Patient ID: Stacey [name], female , DOB: 10-20-1940 , age 47 y.o. , MRN: [id number] , ADDRESS: [address] [place] [address] PCP Tita Form, MD Patient Care Team: Tita Form, MD as PCP - General (Internal Medicine) Sheryle Donning, MD as PCP - Cardiology (Cardiology)  This Provider for this visit: Treatment Team:  Attending Provider: Maire Scot, MD    12/11/2022 -   Chief Complaint  Patient presents with   Follow-up    F/up, in the hosp. 3 weeks ago, and wants to know why she was in the hosp.     HPI JONA MEDITZ 83 y.o. -was last seen in August 2023.  After that her husband has passed away 2022/09/15 following motor vehicle accident when he was crossing the road.  .  Then she was admitted for 3 days 11/17/2022 through 11/20/2022 with nausea and vomiting.  She was also diagnosed with pneumonia.  The hospitalist contacted me.  Out of caution advised him to hold off giving any nintedanib  she did receive IV community-acquired pneumonia therapy.  Review of the labs indicate she did not suffer from acute kidney injury.  Her discharge sodium was slightly low at 134.  Blood cultures negative.She had CT abdomen and pelvis 11/11/2022 that did not  show any acute process in the abdomen. She was discharged to SNF.  She tells me that at baseline she was taking Zofran  once daily with some mild nintedanib  induced nausea but the onset of nausea for this admission was quite abrupt and severe.  She does not know the cause.  After discharge from the SNF 2 weeks ago and now for the last 10 days she is back taking nintedanib .  She is having some mild drug-induced nausea and she says she is able to combat this with taking Zofran  twice daily.  At this point in time she prefers to take Zofran  along with the nintedanib  and see what happens.   For a respiratory standpoint she is frustrated she not been able to get a pulmonary function test since August 2023.  Her ill health and also PFT machine not working have delayed this.  Subjectively she is feeling the same and clinically she feels stable from an ILD standpoint.  Therefore I suspect it is stable.   OV 03/02/2023  Subjective:  Patient ID: Stacey [name], female , DOB: [birth year] , age 2 y.o. , MRN: [id number] , ADDRESS: [address] [place] [address] PCP Margarete Sharps, MD Patient Care Team: Margarete Sharps, MD as PCP - General (Internal Medicine) Sheryle Donning, MD as PCP - Cardiology (Cardiology)  This Provider for this visit: Treatment Team:  Attending Provider: Maire Scot, MD  2    03/02/2023 -   Chief Complaint  Patient presents with   Follow-up    F/up on PFT     HPI AALIYAHA FJELSTAD 83 y.o. -returns for follow-up.  She at this point is tolerating her nintedanib  at 100 mg twice daily but she is increased her Zofran .  This visit is to see her pulmonary function test.  In May 2024 she did have CT abdomen the lung images and that she did show progressive ILD compared to 2020 07/2020.  Her pulmonary function test during this time had declined but today the FVC is improved while the DLCO is worse.  I think overall this will be stable compared to a year ago but  directionally she does have progressive phenotype.  Her last liver function test was in May 2024.  She will have repeat liver function test today.  She also will participate in the ILD-Pro research registry protocol.  I did discuss with her about inhaled pirfenidone versus placebo in a clinical trial development protocol.  She is interested in this and I gave her a copy of the consent.     OV 08/26/2023  Subjective:  Patient ID: Stacey [name], female , DOB: May 16, 1941 , age 89 y.o. , MRN: [id number] , ADDRESS: [address] [address] [place] [address] PCP Tita Form, MD Patient Care Team: Tita Form, MD as PCP - General (Internal Medicine) Sheryle Donning, MD as PCP - Cardiology (Cardiology)  This Provider for this visit: Treatment Team:  Attending Provider: Maire Scot, MD    Nintedanib /Ofev  requires intensive drug monitoring due to high concerns for Adverse effects of , including  Drug Induced Liver Injury, significant GI side effects that include but not limited to Diarrhea, Nausea, Vomiting,  and other system side effects that include Fatigue,  weight loss. Cardiac side effects are a black box warning as well. These will be monitored with  blood work such as LFT initially once a month for 6 months and then quarterly   08/26/2023 -   Chief Complaint  Patient presents with   Follow-up    ILD F/U      HPI LAURELI GRONDAHL 83 y.o. -returns for follow-up.  She is now relocated to a house near the office.  This is because she became a widow.  However shortly after moving to the house she picked up the influenza.  This was early February 2025 this year this month.  She was hospitalized in mild respiratory failure and 1 day of acute kidney injury and some dehydration.  Since then she has been admitted to skilled nursing facility rehabilitation.  She needs 1 more week of this before discharge.  However she continues nintedanib  for her ILD and it is stable.   She also maintains a every 10-month Rituxan .  The last dose was in August 2024.  The next dose is due 08/30/2023 at [facility].  She says now being a widow and living alone and also being deconditioned after influenza she is wondering if she can get her Rituxan  locally.  I did indicate to her that our office has an adjacent infusion center run by [facility].  She was delighted to hear this because there is only 1 mile from her house.  Therefore I have corresponded with Dr. Gerlean Kocher at [facility] and also pharmacy team to see if Rituxan  is still indicated and if so the dosage.  Waiting  to hear on that.  Given all this she wants to hold off on in participating on any recent study.  On exam she did have crackles and squeaks but she does not feel she is in an exacerbation.  She is due for an ILD-pro registry visit today.   Last CT scan of the chest was in 2021 Last pulmonary function test was in August 2024   Of note she tells me that for the last few months she has had pedal edema and this been no workup.  Hospital lab review does not show she has had a BNP or echo.  Last echo was in 2022.   For this visit external records from the hospital were reviewed.  Also communication was established with other providers.  She is on    OV 09/09/2023  Subjective:  Patient ID: Stacey [name], female , DOB: [birth year] , age 67 y.o. , MRN: [id number] , ADDRESS: [address] [address] [place] [address] PCP Tita Form, MD Patient Care Team: Tita Form, MD as PCP - General (Internal Medicine) Sheryle Donning, MD as PCP - Cardiology (Cardiology)  This Provider for this visit: Treatment Team:  Attending Provider: Maire Scot, MD    09/09/2023 -     HPI Stacey [name] 83 y.o. -acute visit posthospitalization.  She just saw me 08/26/2023.  Then 4 days later on 08/30/2023 she ended up in the ER.  Review of the records show that she had  vomiting the previous day and then the next morning she was no acute hypoxic respiratory failure.  She got treated with antibiotics and steroids.  She did have an echocardiogram that did not show pulmonary hypertension.  She also had a BNP that was normal.  She had significant pedal edema.  Her UA was abnormal.  A the cellulitis of urinary tract infection was because of her vomiting but she is on nintedanib .  She was treated with antibiotics.  She is now even more physically deconditioned but managing.  She is off oxygen  at rest.  She did get some steroids.  She is discharged and she is presenting with one of her friends and a caretaker.   PFT    OV 10/01/2023  Subjective:  Patient ID: Stacey [name], female , DOB: 11-24-1940 , age 32 y.o. , MRN: [id number] , ADDRESS: [address] [address] [place] [address] PCP Tita Form, MD Patient Care Team: Tita Form, MD as PCP - General (Internal Medicine) Sheryle Donning, MD as PCP - Cardiology (Cardiology) Type of visit: Video Virtual Visit Identification of patient TUERE DARRIN with [birth year] and MRN [id number] - 2 person identifier Risks: Risks, benefits, limitations of telephone visit explained. Patient understood and verbalized agreement to proceed Anyone else on call: her friend Patient location: her rejab jp,e This provider location: [address], [address]; [place]; [place] [address]. [facility]. [phone]      This Provider for this visit: Treatment Team:  Attending Provider: Maire Scot, MD   10/01/2023 -    HPI Stacey [name] 83 y.o. -just saw her 3 weeks ago.  She still has a follow-up from hospitalization that is pending in 1 week.  When I saw her last time she was extremely fatigued after the hospitalization that was for vomiting and also cellulitis.  I advised her to stop her nintedanib  and which she did for 2 weeks and then restarted currently taking 100 mg low-dose  protocol twice daily.  She is tolerating it fine.  She said stopping and restarting it did not make any difference.  But the main complaint she has is that she is feeling poorly.  She feels fatigued [although on the camera I could not tell a difference].  She saw Dr. Mamie Searles her primary care physician 4 days ago this week and was found to be appropriate hypoxemic 83% on room air.  Since then she has been on 3 L nasal cannula continuous.  She is on antibiotics and prednisone .  Apparently chest x-ray showed ILD along with pneumonia.  She is also having insomnia.  It since she is now 3 months past her Rituxan  which she wants to get it at our infusion center through [facility].  She feels that her ILD is worse or her symptoms are worse because of the lack of Rituxan  and she really wants to go back on Rituxan .  Nevertheless there is no worsening in arthritis.  She did have labs at primary care but I do not have access to this.   OV 10/07/2023  Subjective:  Patient ID: Stacey [name], female , DOB: August 23, 1940 , age 42 y.o. , MRN: [id number] , ADDRESS: [address] [place] [address] PCP Tita Form, MD Patient Care Team: Tita Form, MD as PCP - General (Internal Medicine) Sheryle Donning, MD as PCP - Cardiology (Cardiology)  This Provider for this visit: Treatment Team:  Attending Provider: Maire Scot, MD    10/07/2023 -   Chief Complaint  Patient presents with   Hospitalization Follow-up    PCP at [facility] told her last week to increase her o2 from 2 to 3 lpm. She feels like her breathing "is fine".  Had fall yesterday. She has had some feet swelling. Not taking lasix .     HPI LAJEAN PICCIANO 83 y.o. -returns for posthospital follow-up.  I saw her in February face-to-face after post influenza follow-up.  Then she got hospitalized for vomiting.  I saw her on video last week and she was quite deconditioned and she was on oxygen .  Today she comes with her  friend/caregiver.  She says she is much improved she is not having any nausea any vomiting.  She is tolerating nintedanib  well with Zofran .  She is feeling stronger.  She is wondering if she needs to use the oxygen .  She exercised sitting and standing 10 times and did not desaturate below 88%.  She is back to baseline.  I advised that she could just use oxygen  with heavy exertion or at night she does not needed at rest.  She still wants to do her Rituxan .  Her pharmacy team is working with her [facility] rheumatologist to get it done at the infusion center here in [place] through [facility] rheumatology prescription.  She has not heard anything.  I did indicate to her that there is no contraindication from my point as of today.  She is finished antibiotics and prednisone .  On exam there is no wheezing.  Of note she fell down yesterday and has a bruise around her right mandible.  Apparently the nursing home staff thought it was because of high oxygen  use.  I indicated to her that the only seen in COPD patients it does not apply to her.  She has upcoming visit with me with pulmonary function test May 2 I advised her to keep that.     OV 11/12/2023  Subjective:  Patient ID: Stacey [name], female , DOB: 12-28-1940 ,  age 40 y.o. , MRN: [id number] , ADDRESS: [address] [place] [address] PCP Tita Form, MD Patient Care Team: Tita Form, MD as PCP - General (Internal Medicine) Sheryle Donning, MD as PCP - Cardiology (Cardiology)  This Provider for this visit: Treatment Team:  Attending Provider: Maire Scot, MD      Rheumatoid arthritis with Joint s/ILD- Rituxan [facility])  - progressive phenotype (CT 2017/2019 -> Oct 2020). Planned Ofev  start in Nov 2020 but did not start as of dec 2020. LFT normal Jan 2021   - Rituxan  via [facility] -September 2021 + Medrol  4mg /2mg  QOD baseline many decades (on 8mg  per day since feb 2021 due to recurrent  asthma flares)  - nintedanib  for her ILD she started it on July 31, 2019 - started 100mg  twice daily (started low dose du eto side effect concern) -> increased to 150mg  bid d June 2022 -> reduced to 100mg  bid aug 2022 due tow eight loss  - ILD PRO registry study - since 08/16/19  Athma/obstructive lung disease phenotype on Dulera /Singulair   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 Admitted 06/11/2019  - 06/16/2019. Rx with Remdesviri and Decadron .   - sp evushed x 2  - Admitted August 2021 for respiratory infection not otherwise specified.  Following trip to [place] .  Was hypoxemic.  - Status post  influenza hospitalization for mild respiratory failure February 2025.  Small Hiatal hernia on CT Oct 2020  Rib fracture from fall Dec 2020: Musculoskeletal: There is nondisplaced fractures of the posterior left fifth, 6, 8, 9, and tenth left ribs. There is unchanged slight superior compression deformities of the T5, T6, T10 vertebral bodies with less than 25% loss in height.Associated pulmnary contusion +  -On Reclast  once a year through Dr. Erenest Hatchet  ECHO FEb 2-025 - Gr2 DDx (in 2022 - Gr1 ddx)  -   Chronic sterile Medrol  4 mg with alternating 2 mg.   Nintedanib /Ofev  requires intensive drug monitoring due to high concerns for Adverse effects of , including  Drug Induced Liver Injury, significant GI side effects that include but not limited to Diarrhea, Nausea, Vomiting,  and other system side effects that include Fatigue,  weight loss. Cardiac side effects are a black box warning as well. These will be monitored with  blood work such as LFT initially once a month for 6 months and then quarterly   11/12/2023 -   Chief Complaint  Patient presents with   Follow-up    PFT repeated today. Breathing is about the same.      HPI BELA DEMARZO 83 y.o. -returns for follow-up.  She  presents with her caregiver/friend.  Since her last visit she has had 1-2 more falls but no injuries.  Overall physical frailty persist physical deconditioning persist but she has improved.  She feels stable from a respiratory standpoint although she later admitted that she has got some more slight increased wheezing but is no sputum production.  She had a high-resolution CT chest November 09, 2023 and this new groundglass opacities but overall she feels improved other than the slight increase in wheezing.  She is frustrated she has not gotten her Rituxan .  She says nobody in [facility] is reaching back to her.  Pharmacy team is also not been able to get hold of the people at [facility] from what she tells me.  She  is finding it hard to go to do.  I did recommend that she stop going to [facility] and that she take care locally.  In the past she was going to Dr. Ebbie Goldmann but then she switched over to [facility] because of its [facility] nature.  I did indicate to her that for where she is we should be able to take care of her health issues in [place] and made a referral back to Dr. Ebbie Goldmann.  Initially I told her that I would do the Rituxan  1 dose till Dr. Ebbie Goldmann sees her but later upon noticing the CT inflammatory findings I decided to hold off on the Rituxan  [she is frustrated's been 1 year since her last Rituxan ] and let Dr. Ebbie Goldmann decide.  I did indicate to her that from a joint perspective [she does not have worsening rheumatoid arthritis] that we can hold off on Rituxan  but her pulmonary function test has declined.  She is continuing her nintedanib  low-dose protocol and Zofran .  She is also continuing her baseline Medrol  4 mg and 2 mg alternating.  Her baseline edema has improved.   HRCT from 11/09/2023 showed stable ILD from CTA chest 08/30/2023 and CT C/A/P w/ contrast on 06/21/2023 but progressed since CT chest w/ contrast on 07/03/2019 and there are new consolidations in RML and bilateral lower lobes. Also noted  enlarged pulmonary trunk.   PFTs show worsening FVC, FEV1, and DLCO.    Friend - gave hindpendent history and confirmed falls Extrenal record reviewed MEssage send to Pharmacy team  SYMPTOM SCALE - ILD 03/27/2019  05/24/2019 (2-3 weeks    .mrmonpre cpovid)  08/03/2019 Post covid Post fall rib # 12/18/2019  04/10/2020 142# resp virus admit aug 2021 nos 08/01/2020 134#  10/22/2020 136# Now doing rehab 12/23/2020 136# 03/10/2021 131# Ofev  150mg  bid 04/08/2021 132# Ofev  100mg  bid 07/18/2021 133# ofev  100 x 2 03/06/2022 132#, ofev  100x2 12/11/2022 128# 03/02/2023  09/09/2023 POST ADMIT 11/12/2023   O2 use ra       ra ra ra ra ra ra ra ra ra  Shortness of Breath 0 -> 5 scale with 5 being worst (score 6 If unable to do)                 At rest 0 2 3 2 3 0 3 0 3 1  2 1  4 3 1   Simple tasks - showers, clothes change, eating, shaving 0 3 2 3 4 1 3 2 3 1  3 3  3 3 2   Household (dishes, doing bed, laundry) 2 4 5 3 4 3 3 3 4 3 3 4  4 5 2   Shopping 2 3 6 4 4 4 4 3 4 4 4 4  4 5 2   Walking level at own pace 2 3 4 4 3 2 4 2 3 3 3 4  4 5 3   Walking up Stairs 3 3 5 4 4 4 4 3 4 4 4 5  4 5 4   Total (40 - 48) Dyspnea Score 9 17 25 20 22 14 21 13 21 16 19 21  23 26 14   How bad is your cough? 2 2 3 1 2  0 0 0 1 0 0 0  3 1 2   How bad is your fatigue 3 3.5 5 3.5 3 4 3 2 3 3 4 4  3 5 3   nause    0 1 1 0 1 2.5 1 1 1   0 0 1  vomit  0 1 1 0 1 2.4 1 1 1   0 0 1  diarrhea    0 1 0 0 0 1 0 0 0  0 3 0  anxiety    0 2 0 1 0 1 0 0 1  0 0 0  depression    0 1 0 0 0 0 0 0 0  0 0 0  0   Simple office walk  feet x  3 laps goal with forehead probe 04/12/2018  07/21/2018  05/24/2019  12/18/2019  04/10/2020  08/01/2020  10/22/2020  03/10/2021  07/18/2021  03/06/2022  12/11/2022  08/26/2023  10/07/2023   O2 used Room air - off o2 x 10 min Room air  Room air ra ra ra ra ra ra ra ra ra ra  Number laps completed 3 x 185 feet 3 x 250 feet    r    3 1 lap Sit and satnd - di dnot need walker Sit stand times 10 times without using a  walker  Comments about pace normal normal   Slow, no walker Slow . No walker.  Slow. No walker. No cane walker   waler    Resting Pulse Ox/HR 99% and 72/min 97% and 74/min 97% and 81/min 97% and 76/m 96% and 88/min 94% and 83 96% and HR 79 97% and HR 51 100% and 78 99% and HR 73 97% and HR 76 98% and HR 78 And 98% and heart rate 83  Final Pulse Ox/HR 98% and 95/min 96% and 96/min 94% and 107/min 93% and 94 92% and 97 90% and 100 89% and 104/min 92% and 106 97% and 102 92% and HR 105 94% ad HR 80 93% and HR 90 92% and heart rate 93.  Desaturated </= 88% no no no no no yes yes no no no     Desaturated <= 3% points no no Yes, 3 pints Ues, 4 points Yes,  4 points Yes, 4 points Yes, 7 points  Yes, 3 pints Yes, 7 poins  Yes 5 points Yes 6 points  Got Tachycardic >/= 90/min yes yes yes yes yes yes yes   yes     Symptoms at end of test No complaint Mild dyspnea Mod dyspnea Some dyspnea dyspnea dyspnie cat 3d Dyspneic moderate  Dyspneic and wheezing Mod dyspnea Mopd dyspea     Miscellaneous comments improed from hospital Same v improved woprse same  Wobbly gait baseline Needed 2 break  Stopped x 2 to get balanced and staggered while alking         PFT     Latest Ref Rng & Units 11/12/2023    8:20 AM 03/01/2023   11:45 AM 03/06/2022    3:01 PM 07/02/2021    3:07 PM 03/07/2021   11:05 AM 12/23/2020    1:03 PM 10/22/2020    9:05 AM  PFT Results  FVC-Pre L 1.47  P 1.81  1.62  1.69  1.78  1.66  1.73   FVC-Predicted Pre % 56  P 69  61  62  66  60  63   Pre FEV1/FVC % % 67  P 76  74  73  72  71  76   FEV1-Pre L 0.99  P 1.39  1.20  1.23  1.28  1.17  1.30   FEV1-Predicted Pre % 51  P 71  61  61  64  57  64   DLCO uncorrected ml/min/mmHg 8.70  P 11.53  12.57  15.35  12.94  14.98  13.67   DLCO UNC% % 45  P 59  65  79  66  77  70   DLCO corrected ml/min/mmHg  11.53  12.57  15.35  12.94  14.98  13.80   DLCO COR %Predicted %  59  65  79  66  77  71   DLVA Predicted % 67  P 78  84  100  84  86  80     P  Preliminary result       LAB RESULTS last 96 hours - personally vsiauzled CT Chest High Resolution Result Date: 11/10/2023 CLINICAL DATA:  Diffuse/interstitial lung disease. History of mucoid impaction. EXAM: CT CHEST WITHOUT CONTRAST TECHNIQUE: Multidetector CT imaging of the chest was performed following the standard protocol without intravenous contrast. High resolution imaging of the lungs, as well as inspiratory and expiratory imaging, was performed. RADIATION DOSE REDUCTION: This exam was performed according to the departmental dose-optimization program which includes automated exposure control, adjustment of the mA and/or kV according to patient size and/or use of iterative reconstruction technique. COMPARISON:  08/30/2023, 06/21/2023, 07/03/2019. FINDINGS: Cardiovascular: Aberrant right subclavian artery. Atherosclerotic calcification of the aorta, aortic valve and coronary arteries. Ascending aorta measures 3.9 cm (coronal image 53). Enlarged pulmonic trunk and heart. No pericardial effusion. Mediastinum/Nodes: No pathologically enlarged mediastinal or axillary lymph nodes. Hilar regions are difficult to definitively evaluate without IV contrast. Esophagus is grossly unremarkable. Lungs/Pleura: Biapical pleuroparenchymal scarring. Peripheral and somewhat basilar predominant interstitial and subpleural coarsened ground-glass and traction bronchiectasis/bronchiolectasis, as before, but progressive from 07/03/2019. New areas of consolidation in the right middle and both lower lobes. Expiratory phase imaging was not performed in true expiration, limiting the evaluation for air trapping. No pleural fluid. Airway is unremarkable. Upper Abdomen: Cholecystectomy. Visualized portions of the liver, adrenal glands, kidneys, spleen, pancreas, stomach and bowel are grossly unremarkable. No upper abdominal adenopathy. Musculoskeletal: Degenerative changes in the spine. Old thoracic compression deformities.  IMPRESSION: 1. New areas of consolidation in the right middle and both lower lobes, indicative of pneumonia. 2. Pulmonary parenchymal head of interstitial lung disease, as detailed above, stable from recent prior exam but progressive from 07/03/2019. 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 Findings are indeterminate for UIP per consensus guidelines: Diagnosis of Idiopathic Pulmonary Fibrosis: An Official ATS/ERS/JRS/ALAT Clinical Practice Guideline. Am Annie Barton Crit Care Med Vol 198, Iss 5, [phone], Mar 13 2017. 3. Aortic atherosclerosis (ICD10-I70.0). Coronary artery calcification. 4. Enlarged pulmonic trunk, indicative of pulmonary arterial hypertension. Electronically Signed   By: Shearon [name] M.D.   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   reports that she has never smoked. She has never used smokeless tobacco.  Past Surgical History:  Procedure Laterality Date   CHOLECYSTECTOMY  2011   IR ANGIO INTRA EXTRACRAN SEL COM CAROTID INNOMINATE BILAT MOD SED  06/18/2020   IR ANGIO VERTEBRAL SEL SUBCLAVIAN INNOMINATE UNI R MOD SED  06/18/2020   IR ANGIO VERTEBRAL SEL VERTEBRAL UNI L MOD SED  06/18/2020   WISDOM TOOTH EXTRACTION      Allergies  Allergen Reactions   Penicillins Other (See Comments)    Unknown reaction  Did it involve swelling of the  face/tongue/throat, SOB, or low BP? Unknown Did it involve sudden or severe rash/hives, skin peeling, or any reaction on the inside of your mouth or nose? Unknown Did you need to seek medical attention at a hospital or doctor's office? No When did it last happen?     childhood   If all above answers are "NO", may proceed with cephalosporin use.     Remicade [Infliximab] Other (See Comments)    Unknown reaction    Immunization History  Administered Date(s) Administered   Fluad Quad(high Dose 65+) 04/23/2020, 03/23/2022   Influenza Split 04/12/2013, 04/12/2014, 03/14/2015, 04/23/2016   Influenza, High Dose Seasonal PF 04/12/2017, 03/24/2018, 04/13/2019, 03/27/2021   Influenza, Quadrivalent, Recombinant, Inj, Pf 03/27/2021, 04/16/2023   Influenza-Unspecified 04/03/2013, 04/22/2017   Moderna Sars-Covid-2 Vaccination 08/25/2019, 09/22/2019, 03/07/2020   PNEUMOCOCCAL CONJUGATE-20 10/01/2022   Pfizer Covid-19 Vaccine Bivalent Booster 88yrs & up 05/16/2021   Pneumococcal Conjugate-13 04/03/2013, 09/28/2014   Pneumococcal Polysaccharide-23 07/13/2012, 07/24/2013   Respiratory Syncytial Virus Vaccine,Recomb Aduvanted(Arexvy) 04/12/2022   Td 01/11/2018   Tdap 12/10/2017    Family History  Problem Relation Age of Onset   Asthma Mother    Anemia Mother    Polymyalgia rheumatica Mother    COPD Father    Pulmonary fibrosis Father    Breast cancer Maternal Grandmother 50     Current Outpatient Medications:    acetaminophen  (TYLENOL ) 500 MG tablet, Take 1,000 mg by mouth every 6 (six) hours as needed for moderate pain (pain score 4-6)., Disp: , Rfl:    amitriptyline  (ELAVIL ) 25 MG tablet, Take 25 mg by mouth at bedtime., Disp: , Rfl: 0   cholecalciferol  (VITAMIN D3) 25 MCG (1000 UNIT) tablet, Take 1,000 Units by mouth every evening., Disp: , Rfl:    gabapentin  (NEURONTIN ) 300 MG capsule, Take 600 mg by mouth at bedtime., Disp: , Rfl:    losartan  (COZAAR ) 25 MG tablet, Take 12.5 mg by mouth daily., Disp: , Rfl:    Nintedanib  (OFEV ) 100 MG CAPS, Take 1 capsule (100 mg total) by mouth 2 (two) times daily., Disp: 60 capsule, Rfl: 5   ondansetron  (ZOFRAN ) 4 MG tablet, TAKE ONE TABLET BY MOUTH EVERY 8 HOURS AS NEEDED FOR NAUSEA AND VOMITING (Patient taking differently:  Take 4 mg by mouth every 8 (eight) hours as needed for nausea or vomiting.), Disp: 30 tablet, Rfl: 5   pantoprazole  (PROTONIX ) 40 MG tablet, Take 40 mg by mouth daily., Disp: , Rfl:    polyethylene glycol (MIRALAX  / GLYCOLAX ) 17 g packet, Take 17 g by mouth daily as needed for mild constipation., Disp: 14 each, Rfl: 0   pravastatin  (PRAVACHOL ) 20 MG tablet, Take 1 tablet (20 mg total) by mouth daily., Disp: 90 tablet, Rfl: 1   predniSONE  (DELTASONE ) 10 MG tablet, Take prednisone  40 mg daily x 2 days, then 20mg  daily x 2 days, then 10mg  daily x 2 days, then 5mg  daily x 2 days and then go to baseline medrol  dosing. While on prednisone  hold baseline medrol , Disp: 15 tablet, Rfl: 0   venlafaxine  XR (EFFEXOR -XR) 75 MG 24 hr capsule, Take 75 mg by mouth daily., Disp: , Rfl:       Objective:   Vitals:   11/12/23 0859  BP: 114/66  Pulse: 78  SpO2: 94%  Weight: 125 lb (56.7 kg)  Height: 5\' 5"  (1.651 m)    Estimated body mass index is 20.8 kg/m as calculated from the following:   Height as of this encounter: 5\' 5"  (1.651 m).  Weight as of this encounter: 125 lb (56.7 kg).  @WEIGHTCHANGE @  [employer]   11/12/23 0859  Weight: 125 lb (56.7 kg)     Physical Exam   General: No distress. Frail but looks better O2 at rest: no Cane present: NO BT HAS WALERK Sitting in wheel chair: no Frail: YES bu bteer Obese: no Neuro: Alert and Oriented x 3. GCS 15. Speech normal Psych: Pleasant Resp:  Barrel Chest - no.  Wheeze - YEs, Crackles - yes, No overt respiratory distress CVS: Normal heart sounds. 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 PEERL +. No post nasal drip        Assessment:       ICD-10-CM   1. ILD (interstitial lung disease) (HCC)  J84.9     2. Encounter for long-term current use of high risk medication  Z79.899     3. Bibasilar crackles  R09.89     4. Wheezing  R06.2     5. Rheumatoid arthritis involving multiple  sites, unspecified whether rheumatoid factor present (HCC)  M06.9 Ambulatory referral to Rheumatology    6. Grade II diastolic dysfunction  I51.89     7. Pedal edema  R60.0     8. Physical deconditioning  R53.81     9. Frailty  R54     10. Frequent falls  R29.6          Plan:     Patient Instructions  ILD Wheezing  - progressive decline in lung function since last  time  - some whezing 11/12/2023 - seems baseline -Glad doing well tolerating the nintedanib  well with Zofran  and not having any nausea vomiting -Glad leg swelling is improved -Glad overall physical conditioning is improved   Plan - Take prednisone  40 mg daily x 2 days, then 20mg  daily x 2 days, then 10mg  daily x 2 days, then 5mg  daily x 2 days and then got to baseline dosing of Medrol   4mg  and 2mg  (while on prednisone  hold medrol )  - Continue nintedanib  -Continue oxygen  at night and with heavy exertion keeping pulse ox of 88%  = You do not need oxygen  at rest -Continue Breo -   PEDAL EDEMA x few months ECHO - no pulmonary hypertension Feb 2025 but has GRad 2 Diast Dysfunction BNP - normal FEb 2025  -Significantly improved on this visit 11/12/2023   Plan According to primary care physician  Falls Frequent Frailty  -The fall on 10/06/2023 is unrelated to her lung disease or oxygen  use - On 11/12/2023 1-2 more falls since visit end march 2025  Plan - use walker  - continue PT  - discuss with primary care  History of rheumatoid arthritis  - too bad rheumatologist at [facility] is not responding  - noted nearly 1 year since last Riuxan and your strong deisre to go back on it  - from my perspective, joint issues do not need Rituxan  but lung might (decline)  Plan - HOLD OFF RITUXAN  given CT report of consolidation  - let Dr Ebbie Goldmann decide on Rituxan  restart - stop going to [facility] due to distand and DR Lahoma Pigg likely no longer at [facility]  - refer Dr Ebbie Goldmann     Followup -6 weeks- 30 min visit with Dr  Clarisse Crosby  -  Symptom score and exercise hypoxemia test  - CXR at followup   FOLLOWUP Return in about 26 weeks (around 05/12/2024) for 30 min visit, with Dr Bertrum Brodie, Face to  Face Visit.       SIGNATURE    Dr. Maire Scot, M.D., F.C.C.P,  Pulmonary and Critical Care Medicine Staff Physician, [facility] Director - Interstitial Lung Disease  Program  Pulmonary Fibrosis [facility] at [facility] [place], [place], [address]  Pager: [phone], If no answer or between  15:00h - 7:00h: call [phone] Telephone: [phone]  9:39 AM 11/12/2023

## 2023-11-12 NOTE — Patient Instructions (Signed)
Spiro and DLCO performed today. 

## 2023-11-12 NOTE — Telephone Encounter (Signed)
    Stacey [name] - Devki: Going to hold off on Rituxan .  Because of the serum CT changes.  In addition I am making a referral to Dr. Ebbie Goldmann here to take over her rheumatoid arthritis care instead of [facility] because she has not heard anything from [facility] and it is too far now to go there given her frailty

## 2023-11-12 NOTE — Patient Instructions (Addendum)
 ILD Wheezing  - progressive decline in lung function since last  time  - some whezing 11/12/2023 - seems baseline -Glad doing well tolerating the nintedanib  well with Zofran  and not having any nausea vomiting -Glad leg swelling is improved -Glad overall physical conditioning is improved   Plan - Take prednisone  40 mg daily x 2 days, then 20mg  daily x 2 days, then 10mg  daily x 2 days, then 5mg  daily x 2 days and then got to baseline dosing of Medrol   4mg  and 2mg  (while on prednisone  hold medrol )  - Continue nintedanib  -Continue oxygen  at night and with heavy exertion keeping pulse ox of 88%  = You do not need oxygen  at rest -Continue Breo -   PEDAL EDEMA x few months ECHO - no pulmonary hypertension Feb 2025 but has GRad 2 Diast Dysfunction BNP - normal FEb 2025  -Significantly improved on this visit 11/12/2023   Plan According to primary care physician  Falls Frequent Frailty  -The fall on 10/06/2023 is unrelated to her lung disease or oxygen  use - On 11/12/2023 1-2 more falls since visit end march 2025  Plan - use walker  - continue PT  - discuss with primary care  History of rheumatoid arthritis  - too bad rheumatologist at [facility] is not responding  - noted nearly 1 year since last Riuxan and your strong deisre to go back on it  - from my perspective, joint issues do not need Rituxan  but lung might (decline)  Plan - HOLD OFF RITUXAN  given CT report of consolidation  - let Dr Ebbie Goldmann decide on Rituxan  restart - stop going to [facility] due to distand and DR Lahoma Pigg likely no longer at [facility]  - refer Dr Ebbie Goldmann     Followup -6 weeks- 30 min visit with Dr Clarisse Crosby  -  Symptom score and exercise hypoxemia test  - CXR at followup

## 2023-11-12 NOTE — Progress Notes (Signed)
Spiro and DLCO performed today. 

## 2023-11-12 NOTE — Telephone Encounter (Signed)
    Stacey [name]: Please tell the patient that I am going to hold off on Rituxan  prescription and I will let Dr. Ebbie Goldmann decide that.  This because CT did show some new inflammatory findings.  Therefore I will hold off on the Rituxan .  In addition I also want her to come back sooner in 6 weeks for a 30-minute visit and I will do a chest x-ray at that time.

## 2023-11-15 NOTE — Telephone Encounter (Signed)
 I called and spoke with the pt and notified of response from MR  She verbalized understanding  Appt scheduled  Cxr ordered-future

## 2023-11-15 NOTE — Telephone Encounter (Signed)
 Noted - thank you. Will await any follow-up

## 2023-11-16 DIAGNOSIS — M0510 Rheumatoid lung disease with rheumatoid arthritis of unspecified site: Secondary | ICD-10-CM | POA: Diagnosis not present

## 2023-11-16 DIAGNOSIS — J189 Pneumonia, unspecified organism: Secondary | ICD-10-CM | POA: Diagnosis not present

## 2023-11-16 DIAGNOSIS — I10 Essential (primary) hypertension: Secondary | ICD-10-CM | POA: Diagnosis not present

## 2023-11-16 DIAGNOSIS — J9611 Chronic respiratory failure with hypoxia: Secondary | ICD-10-CM | POA: Diagnosis not present

## 2023-11-16 DIAGNOSIS — J849 Interstitial pulmonary disease, unspecified: Secondary | ICD-10-CM | POA: Diagnosis not present

## 2023-11-16 DIAGNOSIS — J440 Chronic obstructive pulmonary disease with acute lower respiratory infection: Secondary | ICD-10-CM | POA: Diagnosis not present

## 2023-11-17 ENCOUNTER — Other Ambulatory Visit: Payer: Self-pay

## 2023-11-17 MED ORDER — AMITRIPTYLINE HCL 25 MG PO TABS: 25.0000 mg | ORAL_TABLET | Freq: Every day | ORAL | 1 refills | Status: DC

## 2023-11-19 ENCOUNTER — Other Ambulatory Visit: Payer: Self-pay | Admitting: Internal Medicine

## 2023-11-19 DIAGNOSIS — I10 Essential (primary) hypertension: Secondary | ICD-10-CM | POA: Diagnosis not present

## 2023-11-19 DIAGNOSIS — J9611 Chronic respiratory failure with hypoxia: Secondary | ICD-10-CM | POA: Diagnosis not present

## 2023-11-19 DIAGNOSIS — J440 Chronic obstructive pulmonary disease with acute lower respiratory infection: Secondary | ICD-10-CM | POA: Diagnosis not present

## 2023-11-19 DIAGNOSIS — J189 Pneumonia, unspecified organism: Secondary | ICD-10-CM | POA: Diagnosis not present

## 2023-11-19 DIAGNOSIS — J849 Interstitial pulmonary disease, unspecified: Secondary | ICD-10-CM | POA: Diagnosis not present

## 2023-11-19 DIAGNOSIS — M0510 Rheumatoid lung disease with rheumatoid arthritis of unspecified site: Secondary | ICD-10-CM | POA: Diagnosis not present

## 2023-11-25 ENCOUNTER — Other Ambulatory Visit: Payer: Medicare Other

## 2023-12-15 DIAGNOSIS — R051 Acute cough: Secondary | ICD-10-CM | POA: Diagnosis not present

## 2023-12-15 DIAGNOSIS — M069 Rheumatoid arthritis, unspecified: Secondary | ICD-10-CM | POA: Diagnosis not present

## 2023-12-15 DIAGNOSIS — R0981 Nasal congestion: Secondary | ICD-10-CM | POA: Diagnosis not present

## 2023-12-15 DIAGNOSIS — M0510 Rheumatoid lung disease with rheumatoid arthritis of unspecified site: Secondary | ICD-10-CM | POA: Diagnosis not present

## 2023-12-15 DIAGNOSIS — J449 Chronic obstructive pulmonary disease, unspecified: Secondary | ICD-10-CM | POA: Diagnosis not present

## 2023-12-15 DIAGNOSIS — J849 Interstitial pulmonary disease, unspecified: Secondary | ICD-10-CM | POA: Diagnosis not present

## 2023-12-15 DIAGNOSIS — R627 Adult failure to thrive: Secondary | ICD-10-CM | POA: Diagnosis not present

## 2023-12-15 DIAGNOSIS — I10 Essential (primary) hypertension: Secondary | ICD-10-CM | POA: Diagnosis not present

## 2023-12-15 DIAGNOSIS — J4540 Moderate persistent asthma, uncomplicated: Secondary | ICD-10-CM | POA: Diagnosis not present

## 2023-12-15 DIAGNOSIS — J9611 Chronic respiratory failure with hypoxia: Secondary | ICD-10-CM | POA: Diagnosis not present

## 2023-12-15 DIAGNOSIS — R0602 Shortness of breath: Secondary | ICD-10-CM | POA: Diagnosis not present

## 2023-12-15 DIAGNOSIS — J069 Acute upper respiratory infection, unspecified: Secondary | ICD-10-CM | POA: Diagnosis not present

## 2024-01-03 ENCOUNTER — Ambulatory Visit (INDEPENDENT_AMBULATORY_CARE_PROVIDER_SITE_OTHER)

## 2024-01-03 ENCOUNTER — Encounter: Payer: Self-pay | Admitting: Internal Medicine

## 2024-01-03 ENCOUNTER — Ambulatory Visit (INDEPENDENT_AMBULATORY_CARE_PROVIDER_SITE_OTHER): Admitting: Internal Medicine

## 2024-01-03 VITALS — BP 108/58 | HR 81 | Ht 65.0 in | Wt 125.0 lb

## 2024-01-03 DIAGNOSIS — I5189 Other ill-defined heart diseases: Secondary | ICD-10-CM

## 2024-01-03 DIAGNOSIS — Z8709 Personal history of other diseases of the respiratory system: Secondary | ICD-10-CM

## 2024-01-03 DIAGNOSIS — R296 Repeated falls: Secondary | ICD-10-CM | POA: Diagnosis not present

## 2024-01-03 DIAGNOSIS — M359 Systemic involvement of connective tissue, unspecified: Secondary | ICD-10-CM | POA: Diagnosis not present

## 2024-01-03 DIAGNOSIS — J8489 Other specified interstitial pulmonary diseases: Secondary | ICD-10-CM | POA: Diagnosis not present

## 2024-01-03 DIAGNOSIS — R062 Wheezing: Secondary | ICD-10-CM

## 2024-01-03 DIAGNOSIS — Z7952 Long term (current) use of systemic steroids: Secondary | ICD-10-CM

## 2024-01-03 DIAGNOSIS — Z862 Personal history of diseases of the blood and blood-forming organs and certain disorders involving the immune mechanism: Secondary | ICD-10-CM | POA: Diagnosis not present

## 2024-01-03 DIAGNOSIS — R918 Other nonspecific abnormal finding of lung field: Secondary | ICD-10-CM | POA: Diagnosis not present

## 2024-01-03 DIAGNOSIS — R0989 Other specified symptoms and signs involving the circulatory and respiratory systems: Secondary | ICD-10-CM | POA: Diagnosis not present

## 2024-01-03 DIAGNOSIS — J849 Interstitial pulmonary disease, unspecified: Secondary | ICD-10-CM | POA: Diagnosis not present

## 2024-01-03 DIAGNOSIS — M069 Rheumatoid arthritis, unspecified: Secondary | ICD-10-CM

## 2024-01-03 DIAGNOSIS — Z79899 Other long term (current) drug therapy: Secondary | ICD-10-CM | POA: Diagnosis not present

## 2024-01-03 LAB — CBC WITH DIFFERENTIAL/PLATELET
Basophils Absolute: 0.1 10*3/uL (ref 0.0–0.1)
Basophils Relative: 0.5 % (ref 0.0–3.0)
Eosinophils Absolute: 0.4 10*3/uL (ref 0.0–0.7)
Eosinophils Relative: 4.5 % (ref 0.0–5.0)
HCT: 34.7 % — ABNORMAL LOW (ref 36.0–46.0)
Hemoglobin: 11.3 g/dL — ABNORMAL LOW (ref 12.0–15.0)
Lymphocytes Relative: 28.4 % (ref 12.0–46.0)
Lymphs Abs: 2.8 10*3/uL (ref 0.7–4.0)
MCHC: 32.5 g/dL (ref 30.0–36.0)
MCV: 87.1 fl (ref 78.0–100.0)
Monocytes Absolute: 1.2 10*3/uL — ABNORMAL HIGH (ref 0.1–1.0)
Monocytes Relative: 12.2 % — ABNORMAL HIGH (ref 3.0–12.0)
Neutro Abs: 5.4 10*3/uL (ref 1.4–7.7)
Neutrophils Relative %: 54.4 % (ref 43.0–77.0)
Platelets: 260 10*3/uL (ref 150.0–400.0)
RBC: 3.99 Mil/uL (ref 3.87–5.11)
RDW: 14.9 % (ref 11.5–15.5)
WBC: 9.9 10*3/uL (ref 4.0–10.5)

## 2024-01-03 LAB — HEPATIC FUNCTION PANEL
ALT: 10 U/L (ref 0–35)
AST: 23 U/L (ref 0–37)
Albumin: 3.7 g/dL (ref 3.5–5.2)
Alkaline Phosphatase: 54 U/L (ref 39–117)
Bilirubin, Direct: 0.1 mg/dL (ref 0.0–0.3)
Total Bilirubin: 0.3 mg/dL (ref 0.2–1.2)
Total Protein: 7.2 g/dL (ref 6.0–8.3)

## 2024-01-03 LAB — BRAIN NATRIURETIC PEPTIDE: Pro B Natriuretic peptide (BNP): 84 pg/mL (ref 0.0–100.0)

## 2024-01-03 NOTE — Progress Notes (Signed)
 Brief patient profile:  70 yowf  never smoker with allergies/inhalers as child outgrew by [employer] then  RA since around 2000  Prednisone   x decades and prev eval by Dr Brien around 2004 for sob resolved s maint rx and referred 05/26/2013 by Dr Clarice for bronchitis and abn cxr   History of Present Illness  05/26/2013 1st [facility] office visit/ Wert cc June 2014 dx pna  In [place] and remicade stopped and 100% better and placed arencia in September 2014  then abruptly worse first week in November with cough green sputum s nasal symptoms, fever low grade and no cp or cough and completely recovered prior to OV does not recall abx but issue is why keeps getting sick and abn CT Chest (see below).   Arthritis symptoms well controlled at present on Rx for RA rec Nexium 40 mg Take 30-60 min before first meal of the day and add pepcid  20 mg one at bedtime whenever coughing.     10/19/2017  f/u ov/Wert re:  RA lung dz  Chief Complaint  Patient presents with   Follow-up    Cough is much improved, but has not resolved yet. Cough is non prod. She has not had to use her neb.   Dyspnea:  Not limited by breathing from desired activities  But some doe x steps Cough: daytime > noct dry  Sleep: fine  SABA use:  No saba Medrol  4 mg a/w 2mg  per day/ ok control of arthritis  rec Start back on gabapentin  up to 300 mg each am  in addition to the the two at bedtime  If not better increase the medrol  to 8 mg daily until bettter then taper back to where you      01/10/2018  f/u ov/Wert re:  RA  Lung dz Medrol   4 mg  One alternating with a half Chief Complaint  Patient presents with   Follow-up    PFT's done. Her breathing has been gradually worsening since the last visit. She has occ cough- non prod.   Dyspnea gradually worse since last ov:  MMRC1 =  MMRC3 = can't walk 100 yards even at a slow pace at a flat grade s stopping due to sob    Gradually x 3 m / more fatigue / no change in  arthritis  Cough: not an issue rec Protonix  40 mg Take 30-60 min before first meal of the day  GERD diet   01/17/2018 acute extended ov/Wert re: cough on medrol  4 mg  One a/w one half  Chief Complaint  Patient presents with   Acute Visit    started coughing 01/11/18- occ prod with minimal green sputum.  She states also wheezing and having increased SOB.    abruptly worse 01/11/18 with severe 24/7 coughing >>  prod min green mucus esp in am/ assoc with subjective wheeze and did not follow previous contingencies re flutter / saba/ increase medrol  and admits she does not rember those written instructions nor how to use the neb provided .  No fever/ comfortable at rest sitting  rec For cough > mucinex  dm 1200 mg every 12 hours and cough into the flutter valve as much as possible  Doxycycline  100 mg twice daily x 10 days with glass of water Medrol  4mg  x 2 now and take 2  daily until cough is better then 1 daily x 5 days and then resume the previous dose  Shortness of breath/ wheezing/ still coughing >  albuterol  neb every 4 hours as needed      01/20/2018 acute extended ov/Wert re: refractory cough and sob 01/11/18 Chief Complaint  Patient presents with   Acute Visit    she is not feeling better, coughing , very SOB, wheezing  mucus now clear/scant  on doxy/ neb machine not working (tube would not plug into the side s adequate force and she was not capable of applying it due to RA hands. Cough/ wheeze/ sob 24/7 / flutter not helping/ can't lie down at hs   rec While coughing protonix  40 mg Take 30- 60 min before your first and last meals of the day  Shortness of breath/ wheezing/ still coughing > albuterol  neb every 4 hours as needed  Depomedrol 120 mg IM and medrol  32 mg daily x 2 days,  then 16 mg x 3 days,  Then 8 mg x 4 days , then resume the 4 mg daily  For severe cough > tylenol  3# one every 4 hours if needed  Go to ER if condition worsens on above plan       Date of admission: 01/22/2018              Date of discharge: 01/27/2018   History of present illness: As per the H and P dictated on admission,  Stacey [name]  is a 83 y.o. Stacey [name], w Rheumatoid arthritis, ILD Asthma, apparently c/o increase in dyspnea this evening. Dry cough.   Denies fever, chills, cp, palp,  N/v, diarrhea, brbpr, black stool.   Pt notes recently being given steroid injection in office as well as being placed on doxycycline . This might have helped slightly but pt worse  Hospital Course:  Summary of her active problems in the hospital is as following. 1 dyspnea/hypoxemia/ILD Concerned that likely GERD Is causing ILD. Patient with cough.   Patient with complaints of awakening with cough and also with oral intake which is slightly improved since 01/24/2018. assessed by speech therapy and speech therapy raising concern of esophageal component but no signs of aspiration.   2D echo with a EF of 55 to 60% with no wall motion abnormalities, grade 1 diastolic dysfunction.  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 Pulmonary felt that the patient should be on scheduled Reglan . I have placed the patient on scheduled potassium before sleep. Continue steroids on discharge continue Mucinex  and Claritin  as well as inhalers. Patient will follow-up with pulmonary outpatient   2.  Gastroesophageal reflux disease Continue PPI and H2 blocker.  I changed PPI to AC.   3.  Rheumatoid arthritis Outpatient follow-up.     4.  Anxiety Continue Effexor .       All other chronic medical condition were stable during the hospitalization.  Patient was ambulatory without any assistance. On the day of the discharge the patient's vitals were stable , and no other acute medical condition were reported by patient. the patient was felt safe to be discharge at home with family.   Consultants: PCCM  Procedures: Echocardiogram       03/21/2018  f/u ov/Wert re:   S/p admit was transiently  better  and downhill since Labor day on medrol  4 mg daily  Last orencia  on Sept 4th 2019  Chief Complaint  Patient presents with   Acute Visit    Per patient, she has had a dry cough since July 2019. She has been wheezing as well. Increased fatigue. Body aches. Denies any fever or chills.   Dyspnea:  MMRC4  = sob if tries to leave home or while getting dressed   Cough: harsh/ hacking mostly dry/ has flutter not using    SABA use: not much better with rx   No obvious day to day or daytime variability or assoc excess/ purulent sputum or mucus plugs or hemoptysis or cp or chest tightness, subjective wheeze or overt sinus or hb symptoms.     Also denies any obvious fluctuation of symptoms with weather or environmental changes or other aggravating or alleviating factors except as outlined above   No unusual exposure hx or h/o childhood pna/ asthma or knowledge of premature birth.   INpatient consult 03/26/18 83 year old with rheumatoid arthoritis.At baseline the patient lives at home with her husband and is independent of ADLs.  Has been on many immune suppressants over decades and curently on orencia  x 4 year and prednisone . Does not recollect being on bactrim /dapsone. Chart mentions BOOP/MAI in 2001 but she denies this. 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 Got admitted. Rx with steroids. Per Notes - clnical suspicion of  arytenoid inflmmation related wheeze noticed (she also reports asthma NOS). Follolwup with ENT recommended (but not seen one as yet). She also appears to have passed swallow with rec for regular diet with thin liquids but did to have mild eso stricture and reflux during testing . PFT shows 10% FVC decline for first ime. CT chest at this tme (aug 2019) showed new rLL infiltrate.  ECHO July 2019 without evicence of elevated  PASP and saw cards [facility] June 2019 and was considered to have worsenin dyspnea due to [facility] issues (reports stress test at [facility] that was normal but I cannot see it)   She reports after discharge she got better but in last several weeks has deteriorated with cough and dyspnea. There is new hypoxemia (currently RA with nail polish and poor circulation  - 89% pulse ox) needing 2L [place]. Per [facility] improved with steroids and abx. CTA 03/24/2018 => shows that RLL inifltrate has improved . Other chronic ILD changes + and small  Hiatal hernia + witthout change.   Review of lab work does show eosinophilia at time of admision   EVENTS 03/21/18 - IgE -5, blood allegy panel - negative, 03/23/2018 - - admit . HIGH EOS 2300, ESR 48, BNP 89 , HIV neg 9/12- PCT negative, RVP negative 9/14 -  PCT < 0.1, Urine strep - negative, MRSA PCR - positive. IgE - normal 4, Blood allergy  panel repeat - negative 03/26/2018 - leading consideration for airway (BO in RA +/- asthma) related flare either due to MRSA bronchitis or clinical suspicion of arytenoid inflmmation +/- GERD relatd flare (she has small hiatal hernia)  +/- ? Dysphagia  up causing mild hypoxemia acute resp failure, wheeze . Allergy  and IgE blood work negative thought. Patient reports being better but says she is choking on drinkin water [facility] MD says wheeze improved significantly with steroids.  RN says was down to RA yesterday evening but needed 1L [place] at sleep. Today -Room air at rest 94% and desaturated to 86% walking 60 feet 03/27/2018  - better. Off o2 at rest. STill coughs with water and when lies down.  Husband at bedside. Both requesting ILD clinic followup . Desaturated t 79% walking 90 feet.   OV 04/12/2018  Subjective:  Patient ID: Stacey [name], Stacey [name] , DOB: August 04, 1940 , age 69 y.o. , MRN: [id number] , ADDRESS: [address] [place] [address]   04/12/2018 -   Chief Complaint  Patient presents with   Consult    Pt is a former MW pt.  Pt  denies any current complaints of cough, SOB, or CP but states the cough she originally had ended her up in the hosp 9/11-9/17 with dx acute respiratory failure. Pt does wear 2pulse with exertion and also wears 2L continuous when at home.     HPI Stacey [name] 83 y.o. -presents for follow-up to the ILD clinic.  She is known to have rheumatoid arthritis with ILD changes.  She had been followed by Dr. Ozell America.  However in July 2019 in September 2019 she has had 2 admissions to the hospital with respiratory distress and hypoxemic respiratory failure.  In the first 1 that seem to be right lower lobe infiltrate and then she improved from it but in the second 1 even though the right lower lobe infiltrates were better she still was hypoxemic.  Acid reflux and dysphagia was considered a possible etiology but she passed swallow study 2 times.  They thought she had some reflux.  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  At the same time it is not clear if the ILD is progressive based on pulmonary function testing below   At this point in time she tells me that she is getting home physical therapy.  Her fatigue is improving but it is not fully resolved.  She was discharged on continuous oxygen  which she is using.  However she is feeling less short of breath.  Today in fact when we turned her oxygen  off and walked her she did not desaturate and this is a significant improvement.  She is on monthly Orencia  through the [facility] rheumatologist Dr. Delon Gosling.  At this point in time she is put the Orencia  on hold.  She told me that she is been on Orencia  for 4 years and never had a respiratory exacerbation still recently x 2.  Although before going on Orencia  she had pneumonia while on Remicade and the Remicade.  In terms of her rheumatoid arthritis she hardly has any pain.  Her joint architecture is fairly well-preserved because of various immunomodulators over  time.  She says that she was on Remicade for years and when she stopped it for 8 weeks before the switch to Orencia  she never really had a relapse in her rheumatoid arthritis.  She is largely pain and stiffness free.  She believes she can go without  her Orencia  for a while.  Review of the literature shows greater than 10% chance of a respiratory infection especially COPD exacerbation.  Although the time frame for this is unclear.       OV 06/07/2018  Subjective:  Patient ID: Stacey [name], Stacey [name] , DOB: [birth year] , age 46 y.o. , MRN: [id number] , ADDRESS: [address] [place] [address]   06/07/2018 -   Chief Complaint  Patient presents with   Follow-up    ILD, PFT done today, some wheezing and coughing but better tha before   Rheumatoid arthritis ILD and asthma/obstructive lung disease phenotype on Dulera   HPI Stacey [name] 83 y.o. -presents for routine follow-up with her husband.  She is here to follow-up with [facility] Dr. Gosling.  She plans to do this in  December 2019.  She continues to be off Orencia .  Her joints are slowly getting stiff again.  She believes that she will need to be back on immunosuppression agent again.  She currently continues Medrol  4 mg alternating with 2 mg.  This for her rheumatoid arthritis.  In terms of her joints she continues on Medrol  4 mg alternating with 2 mg but not on any other immunosuppression agent.  Overall she is been stable but for the last 2 weeks has had green sputum and wheezing and chest congestion and cough.  She recently visited her husband who was hospitalized and walking the long hallways at [facility] made a short of breath but she thinks this is probably baseline for her.  There are no other new issues.  She did have spirometry and DLCO and this shows a decline compared to September 2019 and a similar to July 2019.  It is documented below.  This is probably reflective of a flareup   OV  07/21/2018  Subjective:  Patient ID: Stacey [name], Stacey [name] , DOB: 11-19-1940 , age 36 y.o. , MRN: [id number] , ADDRESS: [address] [place] [address]   07/21/2018 -   Chief Complaint  Patient presents with   Follow-up    Pt states she has been doing well since last visit. States she is about to begin Rituxan  with [facility]. Pt still becomes SOB with exertion. Denies any complaints of cough or CP.   Rheumatoid arthritis ILD and asthma/obstructive lung disease phenotype on Dulera   HPI Stacey [name] 83 y.o. -presents for follow-up of the above.  Last seen just before Thanksgiving 2019.  In the interim overall stable although on June 23, 2018 she climbs a steep flight of stairs which is unusual exertion for her and she became very dyspneic.  Following day saw Dr. Sharl at [facility] rheumatology and was given Z-Pak and prednisone  and started feeling better.  Although it is not fully clear to me she had fever and bronchitic symptoms.  I reviewed Dr. Sharl note.  Dr. Sharl is decided to start Rituxan  for rheumatoid arthritis.  She is only having some minimal joint pain at this point.  She is off Orencia  and continues to be off Orencia .  She did have some blood work with us  before starting Rituxan .  She is due to see Dr. Sharl within the next week and start her Rituxan .  Her liver function test July 18, 2017 is normal hemoglobin is normal.  CRP is also normal.  We did spirometry and walking desaturation test.  These show improvement compared to before and these are documented below.  Currently she not using nighttime or daytime oxygen .  She is wondering if she could switch rheumatology care to [facility].  This is because while she likes [employer] she is getting older and more frail and feels some body local would be of help.  I have sent a message to Dr. Dolphus inquiring.  Certainly we can help her with Rituxan  infusions at [facility] health system if needed.  She  will check on this with her [facility] rheumatologist.       OV 03/27/2019  Subjective:  Patient ID: Stacey [name], Stacey [name] , DOB: 1941/05/29 , age 44 y.o. , MRN: [id number] , ADDRESS: [address] [place] [address]  Rheumatoid arthritis ILD and asthma/obstructive lung disease phenotype on Dulera    03/27/2019 -  Rourine fu   HPI Stacey [name] 83 y.o. -presents for  the above.  Last seen in January 2020.  After that she has seen Dr. Sharl rheumatology at [facility].  She is now getting Rituxan  2 doses every 6 months.  She says this is helped her joints and her stiffness.  She is a little bit more mobile than usual.  However in terms of her respiratory status she continues to have episodic cough.  In June 2020 she again got hypoxemic and got admitted.  Since then she has had episodic cough.  She had a respiratory exacerbation in June 2020 for the admission she got steroids.  This seemed to help.  She is also on a higher dose Dulera  right now.  In terms of her cough this seems to be her biggest problem.  She seems to be on Dulera , Singulair  scheduled with also Tessalon  and Delsym  and DuoNeb.  Noticed that she is on gabapentin  Elavil  and Effexor  but I think this is all from neuropathy and other issues and not primarily indicated for cough.  Her last high-resolution CT scan of the chest was 1 year ago.  She says the dyspnea itself is not worse.  She is currently not using oxygen .  On exam she did have some wheezing.  Currently she has white and brown sputum but this is baseline.  In terms of a COVID wrist she has been tested recently couple of times and this is been negative.  She is isolating well.  She wanted to know COVID prevention activities and risk status and masking strategies.      OV 05/24/2019  Subjective:  Patient ID: Stacey [name], Stacey [name] , DOB: 18-Aug-1940 , age 31 y.o. , MRN: [id number] , ADDRESS: [address] [place] [address]   05/24/2019 -   Chief  Complaint  Patient presents with   Follow-up    Pt was recently in the hosp due to ILD. Pt states that she has been better since being out of the hosp.   Rheumatoid arthritis ILD and asthma/obstructive lung disease phenotype on Dulera /Singulair   Post herpetic neuralgia - on elavil , gabapentin , effexor   HPI Stacey [name] 83 y.o. -returns for follow-up.  At the last visit approximately 2 months ago she was reporting worsening cough following an admission in summer 2020.  Her pulmonary function test suggested worsening ILD status.  Therefore we requested a high-resolution CT chest which she did in October 2020.  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  However in the interim after the CT scan was done towards the end of October 2020 she developed worsening of her cough over 2 weeks and also associated shortness of breath but significantly the cough is much worse.  She ended up getting admitted to the hospital.  There was some hypoxemia.  By this time she had finished a ENT evaluation that did not show any involvement of the arytenoids.  Pulmonary was consulted.  She was given a prednisone  burst which she just finished I believe yesterday.  She is back on her baseline Medrol .  And she is feeling better.  Her oxygen  status is improved although she is using oxygen  at night now.  She is really frustrated with these recurrent flareups and these admissions which ended up with her having a wheeze and also hypoxemia.  Currently she is on her baseline Medrol  for rheumatoid arthritis associated with Dulera  and Singulair .  She is also on losartan  for blood pressure.  Her walking desaturation test is slightly worse than baseline.  She has a GI consult pending because of the recurrent episodes of cough and flareups.  We went over exposure history.  We used interstitial lung disease questionnaire for the exposure history.  Specifically she denies any electronic cigarette use of  marijuana use of cocaine use or any IV drug abuse.  She [place] last 14 years.  Asked extensive questions about the home environment it is positive for nebulizer use but the nebulizer does not have mildew or mold in it.  Otherwise no organic antigen exposure.  The house is not damp.  There is no mold or mildew in the shower curtain.  There is no humidifier use no steam iron use.  No Jacuzzi use.  No misting [place] outside to inside the house.  No pet birds.  No pet gerbils no feather pillows.  There is no mold in the [facility] duct.  She does not do any gardening.  Does not use wind instruments.  Also 122 question occupational history elicited and essentially negative.  The other issue is that she has polypharmacy.  She is asking for my help in reducing her medications.  She is on 3 medications for postherpetic neuralgia.  She is on losartan       OV 06/21/2019  Subjective:  Patient ID: Stacey [name], Stacey [name] , DOB: 1941-01-31 , age 61 y.o. , MRN: [id number] , ADDRESS: [address] [place] [address]   06/21/2019 -   Chief Complaint  Patient presents with   televisit    hosp 11/29-12/4 due to covid with pna. pt said that she is doing okay after recent hosp but states she has no energy.      Stacey [name] 83 y.o. - last visit 05/24/2019. Diagnosed with covid-19 on 05/31/2019. Admitted 06/11/2019  - 06/16/2019. Quarantine ends 06/21/2019 today. Rx with Remdesviri and Decadron . Husband also on phone. Questions  1. Quarantine ends - from 06/22/19\ and she is not contagious and likely resistant to reinfection to covid for another few months  2. She is on 10 day dexamethasone  for covid - today is last day for it  3. She is on dulera . Hospital gave combivent  and she does not want do combivent  - this is fine  4. Ofev  for ILD - not started it yet . She had questions about side effects. Explained GI and LFT monirtong. SHe wanted to  wait till Jan 2021 and start it . I am ok with that.   5. Small hiat   IMPRESSION: HRCT OCt 2020 1. 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 Findings have progressed since 2017 and 2019 high-resolution chest CT studies. 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 Findings are consistent with UIP per consensus guidelines: Diagnosis of Idiopathic Pulmonary Fibrosis: An Official ATS/ERS/JRS/ALAT Clinical Practice Guideline. Am JINNY Honey Crit Care Med Vol 198, Iss 5, [phone], Mar 13 2017. 2. 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 4. Small hiatal hernia.   Aortic Atherosclerosis (ICD10-I70.0).     Electronically Signed   By: Selinda DELENA Blue M.D.   On: 04/24/2019 13:29    OV 08/03/2019 - face to face visit  Subjective:  Patient ID: Stacey [name], Stacey [name] , DOB: May 20, 1941 , age 7 y.o. , MRN: [id number] , ADDRESS: [address] [place] [address]    Rheumatoid arthritis with Joint s/ILD- Rituxan kathie ([facility])  - progressive phenotype (CT 2017/2019 -> Oct 2020). Planned Ofev  start in Nov 2020 but did not start as of dec 2020. LFT normal Jan 2021  Athma/obstructive lung disease phenotype on Dulera /Singulair   Post herpetic neuralgia - on elavil , gabapentin , effexor   Coronary Artery Calcification - 1 vessel - cardiology referral done  Diagnosed with covid-19 on 05/31/2019. Admitted 06/11/2019  - 06/16/2019. Rx with Remdesviri and Decadron .  Small Hiatal hernia onCT Oct 2020  Rib fracture from fall Dec 2020: Musculoskeletal: There is nondisplaced fractures of the posterior left fifth, 6, 8, 9, and tenth left ribs. There is unchanged slight superior compression deformities of the T5, T6, T10 vertebral bodies with less than 25% loss in height.Associated pulmnary contusion  +    HPI Stacey [name] 83 y.o. -presents for a visit. Her nintedanib  has been delayed because of COVID-19. Also subsequently a fall. She saw a nurse practitioner on July 20, 2019 and they took a shared decision to start nintedanib . She had a ? telephone visit with Dr. Delon Jama Gosling the rheumatologist at [facility]. Patient scheduled for her next Rituxan  in February 2021 but reviewed the notes indicates this could be pushed to early spring 2021. Dr. Gosling is okay with the patient study got intubated at this point in time.  Patient has follow-up with Dr. Gosling tomorrow at [facility].  At this face-to-face visit she is here with her husband.  She says she is much better after the Covid and also the fall that fractured her ribs.  Nevertheless all this is left her fatigue.  Infective fatigue scores are much worse.  Also in the last 2 weeks has had increased cough wheezing and shortness of breath.  The sputum was also changed color in the last 1 week to clear green.  This is all new.  Her symptom score is therefore a worse than her baseline.  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  She is only taking 100 mg once a day.  The plan was to go up to 100 mg twice a day next week.  This is a minimum effective dose.  The maximum dose is 150 mg twice a day.  Given her age and comorbidities we are starting at a low dose.  Part of this visit is to make sure that she is tolerating the drug fine.  And so far she is.  She is interested in the ILD-pro registry study done by the [facility] clinical research [facility].   She is working with physical therapy for her fatigue.  Ambulatory Walk 07/20/2019 2 Lap- O2 92% RA; HR 109 No shortness of breath She did not need to stop Used walker    OV 09/20/2019  Subjective:  Patient ID: Stacey [name], Stacey [name] , DOB: May 17, 1941 , age 33 y.o. , MRN: [id number] , ADDRESS: [address] [place]  [place] [address]   09/20/2019 -   Chief Complaint  Patient presents with   Televisit    Called and spoke with pt who stated she has been feeling okay since last visit. Pt stated she is still taking OFEV  and denies any complaints. Pt states she believes her breathing is stable at this point.    Rheumatoid arthritis with Joint s/ILD- Rituxan /steroids (duke uinviesty)  - progressive phenotype (CT 2017/2019 -> Oct 2020). Planned Ofev  start in Nov 2020 but did not start as of dec 2020. 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 Admitted 06/11/2019  - 06/16/2019. Rx with Remdesviri and Decadron .  Small Hiatal hernia on CT Oct 2020  Rib fracture from fall Dec 2020: Musculoskeletal: There is nondisplaced fractures of the posterior left fifth, 6, 8, 9, and tenth left ribs. There is unchanged slight superior compression deformities of the T5, T6, T10 vertebral bodies with less than 25% loss in height.Associated pulmnary contusion +   HPI TASHONNA DESCOTEAUX 83 y.o. - has 2nd covid vaccine later this week. Rituxan  is end of the month. Baseline RA regiment is On dulera  for associated obstructio of lung. Continue ofev  100mg  bid since mid jan 2021 for ILD. No side effects. Did see Landry Ferrari for face to face visit early feb 2021 -> for bronchitis and felt better after prdnisone and zpak. Currently on medrol    8 mg day and staying there per Landry Ferrari . Wants t oknow if she can taper. Wants to know how she can prevent care flare ups. Explained ofev , masking and social distancing  prevent respiratory infection.Denies choking on food or aspirating. Denies mold in house - relatively news. Denies dog. Denies cat. Denies carious teeth. Denies post nasal drip  OVerll feels better and improved dyspnea.     OV 12/18/2019  Subjective:  Patient ID: Stacey [name], Stacey [name] , DOB: 1940-08-09 , age 54 y.o. , MRN: [id number] , ADDRESS: [address] [place] [address]  PCP Onita Rush, MD Rheumatologist-Dr. Delon Gosling at [facility]/ILD: Dr. Geronimo Morita  12/18/2019 -   Chief Complaint  Patient presents with   Follow-up    no worse   Rheumatoid arthritis with Joint s/ILD- Rituxan /steroids (duke uinviesty)  - progressive phenotype (CT 2017/2019 -> Oct 2020). Planned Ofev  start in Nov 2020 but did not start as of dec 2020. 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 Admitted 06/11/2019  - 06/16/2019. Rx with Remdesviri and Decadron .  Small Hiatal hernia on CT Oct 2020  Rib fracture from fall Dec 2020: Musculoskeletal: There is nondisplaced fractures of the posterior left fifth, 6, 8, 9, and tenth left ribs. There is unchanged slight superior compression deformities of the T5, T6, T10 vertebral bodies with less than 25% loss in height.Associated pulmnary contusion +  -On Reclast  once a year through Dr. Delon Gosling   HPI Stacey [name] 83 y.o. 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  It has been a few month since I last saw her.  In this time she did not taper her Medrol  down.   She stated 8 mg/day.  She was  originally taking the Medrol  for her rheumatoid arthritis for many years.  She recently increased the dose from 4 mg / 2 mg every other day to 8 mg daily because of recurrent respiratory exacerbations.  She is asking for Dulera  refills for obstructive lung disease.  She is getting Rituxan  through Dr. Delon Gosling for rheumatoid arthritis.  Her next Rituxan  is in September 2021.  She saw Dr. Gosling in April 2021 and reviewed the note.  Her liver function test at the time was fine.  In terms of her ILD she continues on nintedanib  100 mg twice daily.  She says she has no side effects from the drug she is tolerating it quite well.  We discussed about increasing the dose but she wants to hold off because she just got a new supply.  However she is open to increasing the dose when the supply runs out.  She is due for liver function test today.  In terms of overall symptoms things have improved as can be seen below and the symptom score.SABRA  However her main concern is that of fatigue.  She is open to attending pulmonary rehabilitation.  She is not using oxygen  at home.  Sometime back when we tested overnight oxygen  desaturation test she did not desaturate.  Walking desaturation test today stable.  She does notice of note that her blood pressure has gone up.  She is working with her primary care physician on this.  She is on losartan .    OV 04/10/2020   Subjective:  Patient ID: Stacey [name], Stacey [name] , DOB: 20-Aug-1940, age 40 y.o. years. , MRN: [id number],  ADDRESS: [address] [place] [address] PCP  Onita Rush, MD Providers : Treatment Team:  Attending Provider: Geronimo Amel, MD   Chief Complaint  Patient presents with   Follow-up    CT scan 8/23, has not increased Ofev , shortness of breath with exertion.    Rheumatoid arthritis with Joint s/ILD- Rituxan /steroids (duke uinviesty)  - progressive phenotype (CT 2017/2019 -> Oct 2020). Planned Ofev   start in Nov 2020 but did not start as of dec 2020. 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 Admitted 06/11/2019  - 06/16/2019. Rx with Remdesviri and Decadron .  Admitted August 2021 for respiratory infection not otherwise specified.  Following trip to [place] .  Was hypoxemic.  Small Hiatal hernia on CT Oct 2020  Rib fracture from fall Dec 2020: Musculoskeletal: There is nondisplaced fractures of the posterior left fifth, 6, 8, 9, and tenth left ribs. There is unchanged slight superior compression deformities of the T5, T6, T10 vertebral bodies with less than 25% loss in height.Associated pulmnary contusion +  -On Reclast  once a year through Dr. Delon Gosling    HPI Stacey [name] 83 y.o. -returns for follow-up.  Last week by myself early June 2021.  Then after that in early August 2020 when she got admitted for respiratory infection not otherwise specified.  She was hypoxemic on room  air.  This followed a trip to [place] .  In the follow-up phase end of August 2020 when she had high-resolution CT chest that showed increased groundglass opacities but when compared to a CT from 8 months ago.  Also chronic ILD had worsened.  I personally visualized the image at this point in time she is improved from this admission she is not using oxygen  at all.  In fact walking desaturation test shows near baseline.  Nevertheless overall her symptom scores have declined over time ILD symptom score is listed  below.  She is really concerned about several issues.  In terms of her ILD she was inquiring about escalating to 150 mg twice daily.  Even at 100 mg twice daily she is significantly symptomatic in terms of nonrespiratory issues such as nausea and dizziness and fatigue.  I did caution her that these could all get worse.  Therefore she is just opted to be at 100 mg twice daily  -Recurrent admissions for respiratory issues [I did address that some of this was Covid and fall related and not necessarily her usual summer exacerbation].  Nevertheless, I asked about exposures at home.  She does not have a feather blanket or feather pillow or feather jacket.  She denies any mold or bird exposure or mildew exposure at home.  Does not do any gardening or wind instruments.  She is immunosuppressed and I did notice that she is not on Bactrim  for PCP prophylaxis.  In 2019 when we checked G6PD the lab could not process this lab.  She is interested in Bactrim  prophylaxis.  She is interested in okay getting G6PD rechecked   -She is dealing with significant amount of dizziness.  Neurology is sending her to neuro rehab.  It is believed it is multifactorial.  I reviewed her recent CT head angio report.  I personally visualized the image   -She is also worried about cough.  Currently cough is tolerable but she says prior to her exacerbations cough gets worse.  We did discuss with her that if she is aspirating which she denied.  We did discuss about the possibility of starting Bactrim  and monitoring her cough and she is fine with that.  I did review old records Lungs/Pleura: Biapical pleuroparenchymal scarring. Patchy and largely peripheral peribronchovascular ground-glass, increased from 07/03/2019. Underlying subpleural reticulation, traction bronchiectasis/bronchiolectasis and scattered honeycombing, similar to minimally increased from 07/03/2019. Calcified granulomas. No pleural fluid. Airway is unremarkable. No air  trapping.   Upper Abdomen: Visualized portions of the liver, adrenal glands, kidneys, spleen, pancreas, stomach and bowel are unremarkable with the exception of a small hiatal hernia. Cholecystectomy. Calcified upper abdominal lymph nodes.   Musculoskeletal: Degenerative changes in the spine. No worrisome lytic or sclerotic lesions.   IMPRESSION: 1. Increased patchy pulmonary parenchymal ground-glass may be due to an atypical/viral pneumonia, including due to COVID-19. 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 2. Aortic atherosclerosis (ICD10-I70.0). Coronary artery calcification.     Electronically Signed   By: Newell Eke M.D.   On: 03/04/2020 12:58    OV 08/01/2020  Subjective:  Patient ID: Stacey [name], Stacey [name] , DOB: 1940/12/08 , age 59 y.o. , MRN: [id number] , ADDRESS: [address] [place] [address] PCP Onita Rush, MD Patient Care Team: Onita Rush, MD as PCP - General (Internal Medicine) Lonni Slain, MD as PCP - Cardiology (Cardiology)  This Provider for this visit: Treatment Team:  Attending Provider: Geronimo Amel, MD   Rheumatoid arthritis with Joint s/ILD- Rituxan kathie (duke uinviesty)  - progressive phenotype (CT 2017/2019 -> Oct 2020). Planned Ofev  start in Nov 2020 but did not start as of dec 2020. 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 Admitted 06/11/2019  - 06/16/2019. Rx with Remdesviri and Decadron .  Admitted August 2021 for respiratory infection not otherwise specified.  Following trip to [place] .  Was hypoxemic.  Small Hiatal hernia on CT Oct 2020  Rib fracture from fall Dec 2020: Musculoskeletal: There is nondisplaced fractures of the posterior left fifth, 6, 8, 9, and tenth left ribs. There is unchanged slight superior compression deformities of the T5, T6, T10 vertebral bodies with less than 25% loss in height.Associated pulmnary contusion +  -On Reclast  once a year through Dr. Delon Gosling    08/01/2020 -   Chief Complaint  Patient presents with   Follow-up    Doing well     HPI Stacey [name] 83 y.o. -returns for follow-up.  She says since last visit she has lost weight.  In fact tracking her weight it appears she is definitely lost weight.  She has occasional intermittent nausea once every few weeks this is because she does not time her nintedanib  well with food.  She has a low appetite as well.  She tried to go up on the Internet but she is unable to.  She takes a low-dose 100 mg twice daily.  She is on Rituxan  and prednisone .  She is up-to-date with her COVID-vaccine.  Current shortness of breath standpoint she is stable.  Symptoms are stable.  CT Chest data  OV 10/22/2020  Subjective:  Patient ID: Stacey [name], Stacey [name] , DOB: Dec 25, 1940 , age 81 y.o. , MRN: [id number] , ADDRESS: [address] [place] [address] PCP Onita Rush, MD Patient Care Team: Onita Rush, MD as PCP - General (Internal Medicine) Lonni Slain, MD as PCP - Cardiology (Cardiology)  This Provider for this visit: Treatment Team:  Attending Provider: Geronimo Amel, MD    10/22/2020 -   Chief Complaint  Patient presents with   Follow-up    Still having shortness of breath with activity, denies  increase in severity.     Rheumatoid arthritis with Joint s/ILD- Rituxan /steroids (duke uinviesty)  - progressive phenotype (CT 2017/2019 -> Oct 2020). Planned Ofev  start in Nov 2020 but did not start as of dec 2020. 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 Admitted 06/11/2019  - 06/16/2019. Rx with Remdesviri and Decadron .  - sp evushed x 2  Admitted August 2021 for respiratory infection not otherwise specified.  Following trip to [place] .  Was hypoxemic.  Small Hiatal hernia on CT Oct 2020  Rib fracture from fall Dec 2020: Musculoskeletal: There is nondisplaced fractures of the posterior left fifth, 6, 8, 9, and tenth left ribs. There is unchanged slight superior compression deformities of the T5, T6, T10 vertebral bodies with less than 25% loss in height.Associated pulmnary contusion +  -On Reclast  once a year through Dr. Delon Gosling  Normal ECHO Summer 2020   HPI Stacey [name] 83 y.o. -returns for follow-up.  At this point in time for her ILD she is taking nintedanib  100 mg twice daily.  Last liver function test was in December 2021.  She is now attending pulmonary rehabilitation.  She is not sure it is helping her.  She tells me that she does not desaturate at pulmonary rehabilitation so she not using her oxygen .  Review of the records indicate same but it appears that today her dyspnea is worse in terms of symptom score although she is denying that.  Walking desaturation  test in the office today with a forehead probe suggest that pulse ox is declined.  Her pulmonary function test also shows 5% FVC decline.  She was surprised by this.  In talking to her realize that for coexistent obstructive lung disease she is no longer taking Dulera .  She is willing to take inhaler which she will have to twist instead of having to pump with her fingers because she has rheumatoid arthritis.  She is no longer losing weight though.  She had questions about taking monoclonal antibody prophylaxis.  She is under the impression she needs to take it every 6 months with Rituxan  dosing.  I did explain to her that in the presence of Rituxan  the body is not able to respond actively to vaccine and that is why she is having prophylactic antibody.  She is now had 2 doses the last 1 being a few weeks ago first 1 was in January 2022.  I have written to the [facility] coordinator Morna Kendall 27 monoclonal antibody prophylaxis schedule.  Expressed to patient that because of monoclonal antibody prophylaxis she did not do the vaccine.  Last echocardiogram was in summer 2020.      OV 12/23/2020  Subjective:  Patient ID: Stacey [name], Stacey [name] , DOB: 1940/10/31 , age 69 y.o. , MRN: [id number] , ADDRESS: [address] [place] [address] PCP Onita Rush, MD Patient Care Team: Onita Rush, MD as PCP - General (Internal Medicine) Lonni Slain, MD as PCP - Cardiology (Cardiology)  This Provider for this visit: Treatment Team:  Attending Provider: Geronimo Amel, MD   12/23/2020 -   Chief Complaint  Patient presents with   Follow-up    PFT performed today. Pt states she has been doing okay since last visit and denies any complaints.     HPI AYAHNA SOLAZZO 83 y.o. -returns for follow-up.  There is concern for progression.  Last visit we have made sure she added Breo.  She says she feels better.  In fact symptom score is better.  However pulmonary function test shows  continued decline.  She is on the low-dose nintedanib .  She says if she does not take the nintedanib  exactly with food and even if  she takes it 15 to 20 minutes later she will have nausea.  Today she had nausea but otherwise if she is diligent she can maintain.  I informed her the pulmonary function test shows continued decline.  She is on Rituxan  prednisone  and low-dose nintedanib .  She initially said she will not be able to tolerate the higher dose nintedanib  but after realizing that her nausea is mild and her weight loss is stable she says she is willing to give the higher dose nintedanib  and attend.  I was able to get her some donor sample of nintedanib .  The lot number of this is [id number].  Expiration date is November 2023.  The serial number is [id number].  The GT IN number is [id number]       ECHO 11/3120  IMPRESSIONS     1. Left ventricular ejection fraction, by estimation, is 65 to 70%. The  left ventricle has normal function. The left ventricle has no regional  wall motion abnormalities. Left ventricular diastolic parameters are  consistent with Grade I diastolic  dysfunction (impaired relaxation). Elevated left ventricular end-diastolic  pressure. The E/e' is 41. The average left ventricular global longitudinal  strain is -18.4 %.   2. Right ventricular systolic function is normal. The right ventricular  size is normal. There is mildly elevated pulmonary artery systolic  pressure. The estimated right ventricular systolic pressure is 36.9 mmHg.   3. The mitral valve is abnormal. Mild mitral valve regurgitation.   4. The aortic valve is tricuspid. Aortic valve regurgitation is not  visualized. Mild aortic valve sclerosis is present, with no evidence of  aortic valve stenosis.   5. The inferior vena cava is normal in size with greater than 50%  respiratory variability, suggesting right atrial pressure of 3 mmHg.   Comparison(s): Changes from prior study are noted. 12/18/18  EF 60-65%. PA  pressure .    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  OV 03/10/2021  Subjective:  Patient ID: Stacey [name], Stacey [name] , DOB: 1941/03/19 , age 9 y.o. , MRN: [id number] , ADDRESS: [address] [place] [address] PCP Onita Rush, MD Patient Care Team: Onita Rush, MD as PCP - General (Internal Medicine) Lonni Slain, MD as PCP - Cardiology (Cardiology)  This Provider for this visit: Treatment Team:  Attending Provider: Geronimo Amel, MD    03/10/2021 -   Chief Complaint  Patient presents with   Follow-up    Pt states she has questions about ofev  she currently on it but having side effects.    Rheumatoid arthritis with Joint s/ILD- Rituxan /steroids (duke uinviesty)  - progressive phenotype (CT 2017/2019 -> Oct 2020). Planned Ofev  start in Nov 2020 but did not start as of dec 2020. 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 Admitted 06/11/2019  - 06/16/2019. Rx with Remdesviri and Decadron .  - sp evushed x 2  - 2nd covid by hx June 2022 - evusheld repeat Oct 2022  Admitted August  2021 for respiratory infection not otherwise specified.  Following trip to [place] .  Was hypoxemic.  Small Hiatal hernia on CT Oct 2020  Rib fracture from fall Dec 2020: Musculoskeletal: There is nondisplaced fractures of the posterior left fifth, 6, 8, 9, and tenth left ribs. There is unchanged slight superior compression deformities of the T5, T6, T10 vertebral bodies with less than 25% loss in height.Associated pulmnary contusion +  -On Reclast  once a year through Dr. 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  -returns for follow-up.  She presents with her husband Patsy.  Last visit she wanted to increase the nintedanib  to 150 mg twice daily but this is made her have more nausea and vomiting.  The diarrhea is very mild.  She then reduced her nintedanib  200 mg twice daily but still having side effects.  She is now started losing weight again.  She is really frustrated by this.  Her husband asked about pirfenidone.  Explained that the research with progressive pulmonary fibrosis is limited in the case of pirfenidone so nintedanib  is the first choice.  Did agree that if nintedanib  did not work we would try pirfenidone.  She wanted know what her options were.  We discussed about stopping nintedanib  and improving quality of life but face the risk of continued progression pulmonary fibrosis.  She was not that enthused about this choice.  We discussed about trying Zofran  with the nintedanib .  She seemed more receptive to this idea.  We decided that we will try with 100 mg twice daily at the lower dose and then see.  She believes asthma is under control.  Her pulmonary function test shown below seems to fluctuate.  Overall symptom severity stable.  She wanted a handicap placard signed today which I did.       OV 04/08/2021  Subjective:  Patient ID: Stacey [name], Stacey [name] , DOB: 1940/10/03 , age 55 y.o. , MRN: [id number] ,  ADDRESS: [address] [place] [address] PCP Onita Rush, MD Patient Care Team: Onita Rush, MD as PCP - General (Internal Medicine) Lonni Slain, MD as PCP - Cardiology (Cardiology)  This Provider for this visit: Treatment Team:  Attending Provider: Geronimo Amel, MD    04/08/2021 -   Chief Complaint  Patient presents with   Follow-up    Pt states she has been doing okay since last visit and denies any real complaints. States breathing has been doing okay and states the nausea went away.    Rheumatoid arthritis with Joint s/ILD- Rituxan /steroids ([facility])  - progressive phenotype (CT 2017/2019 -> Oct 2020). Planned Ofev  start in Nov 2020 but did not start as of dec 2020. LFT normal Jan 2021   - Rituxan  via [facility] -September 2021 + Medrol  4mg /2mg  QOD baseline many decades (on 8mg  per day since feb 2021 due to recurrent asthma flares)  - nintedanib  for her ILD she started it on July 31, 2019 - started 100mg  twice daily (started low dose du eto side effect concern) -> increased to 150mg  bid d June 2022 -> reduced to 100mg  bid aug 2022 due tow eight loss  -  ILD PRO registry study - since 08/16/19  Athma/obstructive lung disease phenotype on Dulera /Singulair   Post herpetic neuralgia - on elavil , gabapentin , effexor   Coronary Artery Calcification - 1 vessel -  S/p - covid-19 on 05/31/2019. Admitted 06/11/2019  - 06/16/2019. Rx with Remdesviri and Decadron .  - sp evushed x 2  Admitted August 2021 for respiratory infection not otherwise specified.  Following trip to [place] .  Was hypoxemic.  Small Hiatal hernia on CT Oct 2020  Rib fracture from fall Dec 2020: Musculoskeletal: There is nondisplaced fractures of the posterior left fifth, 6, 8, 9, and tenth left ribs. There is unchanged slight superior compression deformities of the T5, T6, T10 vertebral bodies with less than 25% loss in height.Associated pulmnary contusion +  -On Reclast   once a year through Dr. Delon Gosling  Normal ECHO Summer 2020 -> possible [facility] May 2021 RSVP 30s with GR1 ddx  HPI Stacey [name] 83 y.o. 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  Last seen 4 weeks ago.  At that time she was losing weight and having significant GI side effects with nintedanib  full dose.  Told her to reduce the nintedanib  dose to 100 mg twice daily and also take Zofran  as needed.  She is taking Zofran  as needed.  She is tolerating the nintedanib  low-dose much better.  Symptom scores are listed below.  Overall she feels better.  Weight loss is stabilized.  Infectious gained 1 pound of weight.  She feels that she will just take with nintedanib  100 mg twice daily using Zofran  as needed.  She does not want to change the schedule.  I fully agree with her and supported on that.  She is aware that the low-dose is less effective but this might be best balance between risk and benefit.  She did ask about getting a COVID by Valent mRNA booster 2 oh micron and BA.5.  According to history she has had COVID x2.  Most recently was in June 2022 which would be against the current viral strain.  In addition she is got monoclonal antibody prophylaxis euvsheld in past.  She does socially isolate herself to the extent possible and masks.  She is going to get the monoclonal antibody prophylaxis again in a few weeks when she gets her Rituxan .  We did discuss the fact that because she is on Rituxan  she is immunosuppressed and does not make antibodies and then the facet antibody transfer that she gets through [facility] is risk protective.  Additional risk protective feature is the fact that she had a current strain of COVID-19 and potentially has natural immunity.  Therefore it is her call to get the bivaet booster although she wanted to avoid it and I supported this in a shared decision making she could hold off till at least January 2023 when it  will be 6 months since her natural infection.     OV 07/18/2021  Subjective:  Patient ID: Stacey [name], Stacey [name] , DOB: [birth year] , age 85 y.o. , MRN: [id number] , ADDRESS: [address] [place] [address] PCP Onita Rush, MD Patient Care Team: Onita Rush, MD as PCP - General (Internal Medicine) Lonni Slain, MD as PCP - Cardiology (Cardiology)  This Provider for this visit: Treatment Team:  Attending Provider: Geronimo Amel, MD    07/18/2021 -   Chief Complaint  Patient presents with   Follow-up    Pt states she has been doing okay since last visit. States  her breathing is about the same.    Rheumatoid arthritis with Joint s/ILD- Rituxan /steroids ([facility])  - progressive phenotype (CT 2017/2019 -> Oct 2020). Planned Ofev  start in Nov 2020 but did not start as of dec 2020. LFT normal Jan 2021   - Rituxan  via [facility] -September 2021 + Medrol  4mg /2mg  QOD baseline many decades (on 8mg  per day since feb 2021 due to recurrent asthma flares)  - nintedanib  for her ILD she started it on July 31, 2019 - started 100mg  twice daily (started low dose du eto side effect concern) -> increased to 150mg  bid d June 2022 -> reduced to 100mg  bid aug 2022 due tow eight loss  - ILD PRO registry study - since 08/16/19  Athma/obstructive lung disease phenotype on Dulera /Singulair   Post herpetic neuralgia - on elavil , gabapentin , effexor   Coronary Artery Calcification - 1 vessel -  S/p - covid-19 on 05/31/2019. Admitted 06/11/2019  - 06/16/2019. Rx with Remdesviri and Decadron .  - sp evushed x 2  Admitted August 2021 for respiratory infection not otherwise specified.  Following trip to [place] .  Was hypoxemic.  Small Hiatal hernia on CT Oct 2020  Rib fracture from fall Dec 2020: Musculoskeletal: There is nondisplaced fractures of the posterior left fifth, 6, 8, 9, and tenth left ribs. There is unchanged slight superior compression deformities of the  T5, T6, T10 vertebral bodies with less than 25% loss in height.Associated pulmnary contusion +  -On Reclast  once a year through Dr. Delon Gosling  Normal ECHO Summer 2020 -> possible [facility] May 2021 RSVP 30s with GR1 ddx   xxxx HPI Stacey [name] 83 y.o. -presents for follow-up.  Last visit September 2022.  Since then she is doing stable.  With a lower dose of nintedanib  no more diarrhea.  Weight loss is stopped.  Barely any nausea.  She is not needing Zofran  for nausea control.  Her main concern right now is that she is having imbalance issues.  She is stopped working with the physical therapy.  She thinks it is 1 of proprioception but also long-term muscle loss and sarcopenia.  I have advised her to take this with her primary care/rheumatology.  Terms of asthma is stable on Breo and Singulair .  Last liver function test April 2022 in our chart.  Latest pulmonary function test shows stability    CT Chest data  No results found.            OV 03/06/2022  Subjective:  Patient ID: Stacey [name], Stacey [name] , DOB: June 03, 1941 , age 83 y.o. , MRN: [id number] , ADDRESS: [address] [place] [address] PCP Onita Rush, MD Patient Care Team: Onita Rush, MD as PCP - General (Internal Medicine) Lonni Slain, MD as PCP - Cardiology (Cardiology)  This Provider for this visit: Treatment Team:  Attending Provider: Geronimo Amel, MD    03/06/2022 -   Chief Complaint  Patient presents with   Follow-up    PFT performed today.  Pt states she has been doing okay since last visit. States her breathing has been doing okay but states she has been having some problems with balance.    HPI Stacey [name] ETCHEVERRY 83 y.o. -returns for follow-up.  Last seen in January 2023.  She says that from a breathing standpoint she is stable but she has had balance issues.  She has had 2 accidental falls.  The first 1 was in March 2023 while entering the house along the steps and she  landed in the bushes and had 25 stitches to her left lower extremity.  She also had collarbone fracture.  Then again a fall in the driveway 2 months ago but with just bruising.  She is now going to undergo physical therapy.  ENT evaluation did not show any ENT causes for the fall.  In terms of her dyspnea and walking desaturation test in the office she is stable but her pulmonary function test shows continued decline.  She is on nintedanib  at the lower dose because she is not able to tolerate the higher dose.  On this lower dose she still requiring Zofran  for control of nausea.  She takes Zofran  preemptively.  There are no other issues.  The last echo and CT scan was over a year ago.     OV 12/11/2022  Subjective:  Patient ID: Stacey [name], Stacey [name] , DOB: November 01, 1940 , age 42 y.o. , MRN: [id number] , ADDRESS: [place] [address] [place] [address] PCP Caleen Dirks, MD Patient Care Team: Caleen Dirks, MD as PCP - General (Internal Medicine) Lonni Slain, MD as PCP - Cardiology (Cardiology)  This Provider for this visit: Treatment Team:  Attending Provider: Geronimo Amel, MD    12/11/2022 -   Chief Complaint  Patient presents with   Follow-up    F/up, in the hosp. 3 weeks ago, and wants to know why she was in the hosp.     HPI RANITA STJULIEN 83 y.o. -was last seen in August 2023.  After that her husband has passed away 09-28-22 following motor vehicle accident when he was crossing the road.  .  Then she was admitted for 3 days 11/17/2022 through 11/20/2022 with nausea and vomiting.  She was also diagnosed with pneumonia.  The hospitalist contacted me.  Out of caution advised him to hold off giving any nintedanib  she did receive IV community-acquired pneumonia therapy.  Review of the labs indicate she did not suffer from acute kidney injury.  Her discharge sodium was slightly low at 134.  Blood cultures negative.She had CT abdomen and pelvis 11/11/2022 that  did not show any acute process in the abdomen. She was discharged to SNF.  She tells me that at baseline she was taking Zofran  once daily with some mild nintedanib  induced nausea but the onset of nausea for this admission was quite abrupt and severe.  She does not know the cause.  After discharge from the SNF 2 weeks ago and now for the last 10 days she is back taking nintedanib .  She is having some mild drug-induced nausea and she says she is able to combat this with taking Zofran  twice daily.  At this point in time she prefers to take Zofran  along with the nintedanib  and see what happens.   For a respiratory standpoint she is frustrated she not been able to get a pulmonary function test since August 2023.  Her ill health and also PFT machine not working have delayed this.  Subjectively she is feeling the same and clinically she feels stable from an ILD standpoint.  Therefore I suspect it is stable.   OV 03/02/2023  Subjective:  Patient ID: Stacey [name], Stacey [name] , DOB: [birth year] , age 24 y.o. , MRN: [id number] , ADDRESS: [address] [place] [address] PCP Onita Rush, MD Patient Care Team: Onita Rush, MD as PCP - General (Internal Medicine) Lonni Slain, MD as PCP - Cardiology (Cardiology)  This Provider for this visit: Treatment  Team:  Attending Provider: Geronimo Amel, MD  2    03/02/2023 -   Chief Complaint  Patient presents with   Follow-up    F/up on PFT     HPI THURLEY FRANCESCONI 83 y.o. -returns for follow-up.  She at this point is tolerating her nintedanib  at 100 mg twice daily but she is increased her Zofran .  This visit is to see her pulmonary function test.  In May 2024 she did have CT abdomen the lung images and that she did show progressive ILD compared to 2020 07/2020.  Her pulmonary function test during this time had declined but today the FVC is improved while the DLCO is worse.  I think overall this will be stable compared to a year ago  but directionally she does have progressive phenotype.  Her last liver function test was in May 2024.  She will have repeat liver function test today.  She also will participate in the ILD-Pro research registry protocol.  I did discuss with her about inhaled pirfenidone versus placebo in a clinical trial development protocol.  She is interested in this and I gave her a copy of the consent.     OV 08/26/2023  Subjective:  Patient ID: Stacey [name], Stacey [name] , DOB: 1941-01-28 , age 61 y.o. , MRN: [id number] , ADDRESS: [place] [address] [place] [address] PCP Caleen Dirks, MD Patient Care Team: Caleen Dirks, MD as PCP - General (Internal Medicine) Lonni Slain, MD as PCP - Cardiology (Cardiology)  This Provider for this visit: Treatment Team:  Attending Provider: Geronimo Amel, MD    Nintedanib Caro  requires intensive drug monitoring due to high concerns for Adverse effects of , including  Drug Induced Liver Injury, significant GI side effects that include but not limited to Diarrhea, Nausea, Vomiting,  and other system side effects that include Fatigue,  weight loss. Cardiac side effects are a black box warning as well. These will be monitored with  blood work such as LFT initially once a month for 6 months and then quarterly   08/26/2023 -   Chief Complaint  Patient presents with   Follow-up    ILD F/U      HPI MAGDALENE TARDIFF 83 y.o. -returns for follow-up.  She is now relocated to a house near the office.  This is because she became a widow.  However shortly after moving to the house she picked up the influenza.  This was early February 2025 this year this month.  She was hospitalized in mild respiratory failure and 1 day of acute kidney injury and some dehydration.  Since then she has been admitted to skilled nursing facility rehabilitation.  She needs 1 more week of this before discharge.  However she continues nintedanib  for her ILD and it is  stable.  She also maintains a every 32-month Rituxan .  The last dose was in August 2024.  The next dose is due 08/30/2023 at [facility].  She says now being a widow and living alone and also being deconditioned after influenza she is wondering if she can get her Rituxan  locally.  I did indicate to her that our office has an adjacent infusion center run by [facility].  She was delighted to hear this because there is only 1 mile from her house.  Therefore I have corresponded with Dr. Delon Jama Gosling at [facility] and also pharmacy team to see if Rituxan  is still indicated and if so the dosage.  Waiting to hear on that.  Given all this she wants to hold off on in participating on any recent study.  On exam she did have crackles and squeaks but she does not feel she is in an exacerbation.  She is due for an ILD-pro registry visit today.   Last CT scan of the chest was in 2021 Last pulmonary function test was in August 2024   Of note she tells me that for the last few months she has had pedal edema and this been no workup.  Hospital lab review does not show she has had a BNP or echo.  Last echo was in 2022.   For this visit external records from the hospital were reviewed.  Also communication was established with other providers.  She is on    OV 09/09/2023  Subjective:  Patient ID: Stacey [name], Stacey [name] , DOB: [birth year] , age 62 y.o. , MRN: [id number] , ADDRESS: [place] [address] [place] [address] PCP Caleen Dirks, MD Patient Care Team: Caleen Dirks, MD as PCP - General (Internal Medicine) Lonni Slain, MD as PCP - Cardiology (Cardiology)  This Provider for this visit: Treatment Team:  Attending Provider: Geronimo Amel, MD    09/09/2023 -     HPI Stacey [name] 83 y.o. -acute visit posthospitalization.  She just saw me 08/26/2023.  Then 4 days later on 08/30/2023 she ended up in the ER.  Review of the records show that she  had vomiting the previous day and then the next morning she was no acute hypoxic respiratory failure.  She got treated with antibiotics and steroids.  She did have an echocardiogram that did not show pulmonary hypertension.  She also had a BNP that was normal.  She had significant pedal edema.  Her UA was abnormal.  A the cellulitis of urinary tract infection was because of her vomiting but she is on nintedanib .  She was treated with antibiotics.  She is now even more physically deconditioned but managing.  She is off oxygen  at rest.  She did get some steroids.  She is discharged and she is presenting with one of her friends and a caretaker.   PFT    OV 10/01/2023  Subjective:  Patient ID: Stacey [name], Stacey [name] , DOB: 22-Sep-1940 , age 85 y.o. , MRN: [id number] , ADDRESS: [place] [address] [place] [address] PCP Caleen Dirks, MD Patient Care Team: Caleen Dirks, MD as PCP - General (Internal Medicine) Lonni Slain, MD as PCP - Cardiology (Cardiology) Type of visit: Video Virtual Visit Identification of patient ZAYLEIGH STROH with 28-Apr-1941 and MRN [id number] - 2 person identifier Risks: Risks, benefits, limitations of telephone visit explained. Patient understood and verbalized agreement to proceed Anyone else on call: her friend Patient location: her rejab jp,e This provider location: [address]; [place]; [place] [address]. [facility]. [phone]      This Provider for this visit: Treatment Team:  Attending Provider: Geronimo Amel, MD   10/01/2023 -    HPI Stacey [name] 83 y.o. -just saw her 3 weeks ago.  She still has a follow-up from hospitalization that is pending in 1 week.  When I saw her last time she was extremely fatigued after the hospitalization that was for vomiting and also cellulitis.  I advised her to stop her nintedanib  and which she did for 2 weeks and then restarted currently taking 100 mg  low-dose protocol twice  daily.  She is tolerating it fine.  She said stopping and restarting it did not make any difference.  But the main complaint she has is that she is feeling poorly.  She feels fatigued [although on the camera I could not tell a difference].  She saw Dr. Onita her primary care physician 4 days ago this week and was found to be appropriate hypoxemic 83% on room air.  Since then she has been on 3 L nasal cannula continuous.  She is on antibiotics and prednisone .  Apparently chest x-ray showed ILD along with pneumonia.  She is also having insomnia.  It since she is now 3 months past her Rituxan  which she wants to get it at our infusion center through [facility].  She feels that her ILD is worse or her symptoms are worse because of the lack of Rituxan  and she really wants to go back on Rituxan .  Nevertheless there is no worsening in arthritis.  She did have labs at primary care but I do not have access to this.   OV 10/07/2023  Subjective:  Patient ID: Stacey [name], Stacey [name] , DOB: 03-Apr-1941 , age 74 y.o. , MRN: [id number] , ADDRESS: [address] [place] [address] PCP Caleen Dirks, MD Patient Care Team: Caleen Dirks, MD as PCP - General (Internal Medicine) Lonni Slain, MD as PCP - Cardiology (Cardiology)  This Provider for this visit: Treatment Team:  Attending Provider: Geronimo Amel, MD    10/07/2023 -   Chief Complaint  Patient presents with   Hospitalization Follow-up    PCP at [facility] told her last week to increase her o2 from 2 to 3 lpm. She feels like her breathing is fine.  Had fall yesterday. She has had some feet swelling. Not taking lasix .     HPI JAQUIA BENEDICTO 83 y.o. -returns for posthospital follow-up.  I saw her in February face-to-face after post influenza follow-up.  Then she got hospitalized for vomiting.  I saw her on video last week and she was quite deconditioned and she was on oxygen .  Today she comes  with her friend/caregiver.  She says she is much improved she is not having any nausea any vomiting.  She is tolerating nintedanib  well with Zofran .  She is feeling stronger.  She is wondering if she needs to use the oxygen .  She exercised sitting and standing 10 times and did not desaturate below 88%.  She is back to baseline.  I advised that she could just use oxygen  with heavy exertion or at night she does not needed at rest.  She still wants to do her Rituxan .  Her pharmacy team is working with her [facility] rheumatologist to get it done at the infusion center here in [place] through [facility] rheumatology prescription.  She has not heard anything.  I did indicate to her that there is no contraindication from my point as of today.  She is finished antibiotics and prednisone .  On exam there is no wheezing.  Of note she fell down yesterday and has a bruise around her right mandible.  Apparently the nursing home staff thought it was because of high oxygen  use.  I indicated to her that the only seen in COPD patients it does not apply to her.  She has upcoming visit with me with pulmonary function test May 2 I advised her to keep that.     OV 11/12/2023  Subjective:  Patient ID: Stacey [name], Stacey [name] , DOB:  1941/01/30 , age 59 y.o. , MRN: [id number] , ADDRESS: [address] [place] [address] PCP Caleen Dirks, MD Patient Care Team: Caleen Dirks, MD as PCP - General (Internal Medicine) Lonni Slain, MD as PCP - Cardiology (Cardiology)  This Provider for this visit: Treatment Team:  Attending Provider: Geronimo Amel, MD    11/12/2023 -   Chief Complaint  Patient presents with   Follow-up    PFT repeated today. Breathing is about the same.      HPI AHAANA ROCHETTE 83 y.o. -returns for follow-up.  She presents with her caregiver/friend.  Since her last visit she has had 1-2 more falls but no injuries.  Overall physical frailty persist physical deconditioning  persist but she has improved.  She feels stable from a respiratory standpoint although she later admitted that she has got some more slight increased wheezing but is no sputum production.  She had a high-resolution CT chest November 09, 2023 and this new groundglass opacities but overall she feels improved other than the slight increase in wheezing.  She is frustrated she has not gotten her Rituxan .  She says nobody in [facility] is reaching back to her.  Pharmacy team is also not been able to get hold of the people at [facility] from what she tells me.  She is finding it hard to go to do.  I did recommend that she stop going to [facility] and that she take care locally.  In the past she was going to Dr. Mai but then she switched over to [facility] because of its [facility] nature.  I did indicate to her that for where she is we should be able to take care of her health issues in [place] and made a referral back to Dr. Mai.  Initially I told her that I would do the Rituxan  1 dose till Dr. Mai sees her but later upon noticing the CT inflammatory findings I decided to hold off on the Rituxan  [she is frustrated's been 1 year since her last Rituxan ] and let Dr. Mai decide.  I did indicate to her that from a joint perspective [she does not have worsening rheumatoid arthritis] that we can hold off on Rituxan  but her pulmonary function test has declined.  She is continuing her nintedanib  low-dose protocol and Zofran .  She is also continuing her baseline Medrol  4 mg and 2 mg alternating.  Her baseline edema has improved.   HRCT from 11/09/2023 showed stable ILD from CTA chest 08/30/2023 and CT C/A/P w/ contrast on 06/21/2023 but progressed since CT chest w/ contrast on 07/03/2019 and there are new consolidations in RML and bilateral lower lobes. Also noted enlarged pulmonary trunk.   PFTs show worsening FVC, FEV1, and DLCO.    Friend - gave hindpendent history and confirmed falls Extrenal record reviewed MEssage  send to Pharmacy team      OV 01/03/2024  Subjective:  Patient ID: Stacey [name], Stacey [name] , DOB: [birth year] , age 75 y.o. , MRN: [id number] , ADDRESS: [address] [place] [address] PCP Caleen Dirks, MD Patient Care Team: Caleen Dirks, MD as PCP - General (Internal Medicine) Lonni Slain, MD as PCP - Cardiology (Cardiology)  This Provider for this visit: Treatment Team:  Attending Provider: Geronimo Amel, MD     Rheumatoid arthritis with Joint s/ILD- Rituxan kathie festus lusher)  - progressive phenotype (CT 2017/2019 -> Oct 2020). Planned Ofev  start in Nov 2020 but did not start as of dec  2020. 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 Admitted 06/11/2019  - 06/16/2019. Rx with Remdesviri and Decadron .   - sp evushed x 2  - Admitted August 2021 for respiratory infection not otherwise specified.  Following trip to [place] .  Was hypoxemic.  - Status post  influenza hospitalization for mild respiratory failure February 2025.  -   Small Hiatal hernia on CT Oct 2020  Rib fracture from fall Dec 2020: Musculoskeletal: There is nondisplaced fractures of the posterior left fifth, 6, 8, 9, and tenth left ribs. There is unchanged slight superior compression deformities of the T5, T6, T10 vertebral bodies with less than 25% loss in height.Associated  pulmnary contusion +  -On Reclast  once a year through Dr. Delon Gosling  ECHO FEb 2-025 - Gr2 DDx (in 2022 - Gr1 ddx)  -   Chronic sterile Medrol  4 mg with alternating 2 mg.   Nintedanib /Ofev  requires intensive drug monitoring due to high concerns for Adverse effects of , including  Drug Induced Liver Injury, significant GI side effects that include but not limited to Diarrhea, Nausea, Vomiting,  and other system side effects that include Fatigue,  weight loss. Cardiac side effects are a black box warning as well. These will be monitored with  blood work such as LFT initially once a month for 6 months and then quarterly   01/03/2024 -   Chief Complaint  Patient presents with   Follow-up    Breathing is stable today. No new co's.      HPI MARKALA SITTS 83 y.o. -returns for continued intensive monitoring given her multiple medical problems and complexity and frailty especially posthospital.  Since her last visit she feels stronger.  She feels stable.  At the last visit early May 2025 I did treat her for respiratory exacerbation with antibiotic and prednisone .  She states that subsequently primary care also treated her with antibiotic and prednisone  for 10 days because she has shortness of breath and cough this was approximately a month ago.  Currently more stable.  Symptom scores are below.     In terms of asthma: Will check blood eosinophils and IgE.  These are high despite Medrol  and given frequent exacerbations will consider biologic   In terms of ILD and RA:  She continues on nintedanib .  She continues daily Medrol .  She still has not started her Rituxan .  Last time we took a shared decision making for her to see Dr. Mai given the delays at [facility] rheumatology for new appointment.  However she decided against going to Dr. Willadean office.  This is because of prior experience that.  She has an upcoming appointment January 21, 2024 at [facility] with Dr.  Lenon.  I did tell her to keep that and to discuss Rituxan .  I did indicate to her that in  speaking with our pharmacist we will review the PET scan at infusion center but the prescription should come from rheumatology or at least the advice.  She will clarify this at her [facility] visit.  Nevertheless she wants a local rheumatologist given the fact she is frail, advancing age and now a widow.  I will make a referral to [facility] rheumatology.  Did indicate to her that the wait time could be few to several months but to accept this referral.  Gave her consent to inhaled treprostinil versus placebo 305 and progressive phenotype ILD study.  She is going to review this.  She is interested  In terms of posthospital pneumonia: She had a chest x-ray that looks improved to me but official report is pending  She still has issues with edema: On exam this is much improved but we will check BNP   In terms of falls: She has not fallen since last visit  : Posthospital anemia hemoglobin 9 g% in February 2025.  No further follow-up labs present.  Therefore we will check it.   SYMPTOM SCALE - ILD 03/27/2019  05/24/2019 (2-3 weeks    .mrmonpre cpovid)  08/03/2019 Post covid Post fall rib # 12/18/2019  04/10/2020 142# resp virus admit aug 2021 nos 08/01/2020 134#  10/22/2020 136# Now doing rehab 12/23/2020 136# 03/10/2021 131# Ofev  150mg  bid 04/08/2021 132# Ofev  100mg  bid 07/18/2021 133# ofev  100 x 2 03/06/2022 132#, ofev  100x2 12/11/2022 128# 03/02/2023  09/09/2023 POST ADMIT 11/12/2023  01/03/2024 125#  O2 use ra       ra ra ra ra ra ra ra ra ra   Shortness of Breath 0 -> 5 scale with 5 being worst (score 6 If unable to do)                  At rest 0 2 3 2 3 0 3 0 3 1  2 1  4 3 1    Simple tasks - showers, clothes change, eating, shaving 0 3 2 3 4 1 3 2 3 1  3 3  3 3 2    Household (dishes, doing bed, laundry) 2 4 5 3 4 3 3 3 4 3 3 4  4 5 2    Shopping 2 3 6 4 4 4 4 3 4 4 4 4  4 5 2     Walking level at own pace 2 3 4 4 3 2 4 2 3 3 3 4  4 5 3    Walking up Stairs 3 3 5 4 4 4 4 3 4 4 4 5  4 5 4    Total (40 - 48) Dyspnea Score 9 17 25 20 22 14 21 13 21 16 19 21  23 26 14    How bad is your cough? 2 2 3 1 2  0 0 0 1 0 0 0  3 1 2    How bad is your fatigue 3 3.5 5 3.5 3 4 3 2 3 3 4 4  3 5 3    nause    0 1 1 0 1 2.5 1 1 1   0 0 1   vomit    0 1 1 0 1 2.4 1 1 1   0 0 1   diarrhea    0 1 0 0 0 1 0 0 0  0 3 0   anxiety    0 2 0 1 0 1 0 0 1  0 0 0   depression    0  1 0 0 0 0 0 0 0  0 0 0   0   Simple office walk  feet x  3 laps goal with forehead probe 04/12/2018  07/21/2018  05/24/2019  12/18/2019  04/10/2020  08/01/2020  10/22/2020  03/10/2021  07/18/2021  03/06/2022  12/11/2022  08/26/2023  10/07/2023   O2 used Room air - off o2 x 10 min Room air  Room air ra ra ra ra ra ra ra ra ra ra  Number laps completed 3 x 185 feet 3 x 250 feet    r    3 1 lap Sit and satnd - di dnot need walker Sit stand times 10 times without using a walker  Comments about pace normal normal   Slow, no walker Slow . No walker.  Slow. No walker. No cane walker   waler    Resting Pulse Ox/HR 99% and 72/min 97% and 74/min 97% and 81/min 97% and 76/m 96% and 88/min 94% and 83 96% and HR 79 97% and HR 51 100% and 78 99% and HR 73 97% and HR 76 98% and HR 78 And 98% and heart rate 83  Final Pulse Ox/HR 98% and 95/min 96% and 96/min 94% and 107/min 93% and 94 92% and 97 90% and 100 89% and 104/min 92% and 106 97% and 102 92% and HR 105 94% ad HR 80 93% and HR 90 92% and heart rate 93.  Desaturated </= 88% no no no no no yes yes no no no     Desaturated <= 3% points no no Yes, 3 pints Ues, 4 points Yes,  4 points Yes, 4 points Yes, 7 points  Yes, 3 pints Yes, 7 poins  Yes 5 points Yes 6 points  Got Tachycardic >/= 90/min yes yes yes yes yes yes yes   yes     Symptoms at end of test No complaint Mild dyspnea Mod dyspnea Some dyspnea dyspnea dyspnie cat 3d Dyspneic moderate  Dyspneic and wheezing Mod dyspnea Mopd dyspea      Miscellaneous comments improed from hospital Same v improved woprse same  Wobbly gait baseline Needed 2 break  Stopped x 2 to get balanced and staggered while alking            SIT STAND TEST - goal 15 times   01/03/2024    O2 used ra   PRobe - finter or forehead forehead   Number sit and stand completed - goal 15 15   Time taken to complete 1 min and 1 second using WALKER   Resting Pulse Ox/HR/Dyspnea  95% and 74/min and dyspnea of 1/10    Peak measures 96 % and 84/min and dyspnea of 8/10   Final Pulse Ox/HR 93% and 84/min and dyspnea of 5/10   Desaturated </= 88% no   Desaturated <= 3% points no   Got Tachycardic >/= 90/min no   Miscellaneous comments Got dyspenic. Needed waer      PFT     Latest Ref Rng & Units 11/12/2023    8:20 AM 03/01/2023   11:45 AM 03/06/2022    3:01 PM 07/02/2021    3:07 PM 03/07/2021   11:05 AM 12/23/2020    1:03 PM 10/22/2020    9:05 AM  PFT Results  FVC-Pre L 1.47  1.81  1.62  1.69  1.78  1.66  1.73   FVC-Predicted Pre % 56  69  61  62  66  60  63   Pre  FEV1/FVC % % 67  76  74  73  72  71  76   FEV1-Pre L 0.99  1.39  1.20  1.23  1.28  1.17  1.30   FEV1-Predicted Pre % 51  71  61  61  64  57  64   DLCO uncorrected ml/min/mmHg 8.70  11.53  12.57  15.35  12.94  14.98  13.67   DLCO UNC% % 45  59  65  79  66  77  70   DLCO corrected ml/min/mmHg  11.53  12.57  15.35  12.94  14.98  13.80   DLCO COR %Predicted %  59  65  79  66  77  71   DLVA Predicted % 67  78  84  100  84  86  80        LAB RESULTS last 96 hours DG Chest 2 View Result Date: 01/03/2024 CLINICAL DATA:  Interstitial lung disease. EXAM: CHEST - 2 VIEW COMPARISON:  CT scan chest from 11/09/2023. FINDINGS: Low lung volume. There are diffuse increased interstitial markings throughout bilateral lungs, compatible with known history of interstitial lung disease. There are nonspecific opacities throughout bilateral lungs, predominantly in the periphery, few of which may correspond to  opacities seen on the recent chest CT scan. These may represent consolidation versus atelectasis. Bilateral costophrenic angles are clear. Stable cardio-mediastinal silhouette. No acute osseous abnormalities. The soft tissues are within normal limits. IMPRESSION: *Redemonstration of diffuse increased interstitial markings throughout bilateral lungs, compatible with known history of interstitial lung disease. There are nonspecific opacities throughout bilateral lungs, predominantly in the periphery, few of which may correspond to opacities seen on the recent chest CT scan. These may represent consolidation versus atelectasis. Electronically Signed   By: Ree Molt M.D.   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   reports that she has never smoked. She has never used smokeless tobacco.  Past Surgical History:  Procedure Laterality Date   CHOLECYSTECTOMY  2011   IR ANGIO INTRA EXTRACRAN SEL COM CAROTID INNOMINATE BILAT MOD SED  06/18/2020   IR ANGIO VERTEBRAL SEL SUBCLAVIAN INNOMINATE UNI R MOD SED  06/18/2020   IR ANGIO VERTEBRAL SEL VERTEBRAL UNI L MOD SED  06/18/2020   WISDOM TOOTH EXTRACTION      Allergies  Allergen Reactions   Penicillins Other (See Comments)    Unknown reaction  Did it involve swelling of the face/tongue/throat, SOB, or low BP? Unknown Did it involve sudden or severe rash/hives, skin peeling, or any reaction on the inside of your mouth or nose? Unknown Did you need to seek medical attention at a hospital or doctor's office? No When did it last happen?     childhood  If all above answers are NO, may proceed with  cephalosporin use.     Remicade [Infliximab] Other (See Comments)    Unknown reaction    Immunization History  Administered Date(s) Administered   Fluad Quad(high Dose 65+) 04/23/2020, 03/23/2022   Influenza Split 04/12/2013, 04/12/2014, 03/14/2015, 04/23/2016   Influenza, High Dose Seasonal PF 04/12/2017, 03/24/2018, 04/13/2019, 03/27/2021   Influenza, Quadrivalent, Recombinant, Inj, Pf 03/27/2021, 04/16/2023   Influenza-Unspecified 04/03/2013, 04/22/2017   Moderna Sars-Covid-2 Vaccination 08/25/2019, 09/22/2019,  03/07/2020   PNEUMOCOCCAL CONJUGATE-20 10/01/2022   Pfizer Covid-19 Vaccine Bivalent Booster 73yrs & up 05/16/2021   Pneumococcal Conjugate-13 04/03/2013, 09/28/2014   Pneumococcal Polysaccharide-23 07/13/2012, 07/24/2013   Respiratory Syncytial Virus Vaccine,Recomb Aduvanted(Arexvy) 04/12/2022   Td 01/11/2018   Tdap 12/10/2017    Family History  Problem Relation Age of Onset   Asthma Mother    Anemia Mother    Polymyalgia rheumatica Mother    COPD Father    Pulmonary fibrosis Father    Breast cancer Maternal Grandmother 17     Current Outpatient Medications:    acetaminophen  (TYLENOL ) 500 MG tablet, Take 1,000 mg by mouth every 6 (six) hours as needed for moderate pain (pain score 4-6)., Disp: , Rfl:    amitriptyline  (ELAVIL ) 25 MG tablet, Take 1 tablet (25 mg total) by mouth at bedtime., Disp: 30 tablet, Rfl: 1   BREO ELLIPTA  100-25 MCG/ACT AEPB, Inhale 1 puff into the lungs daily., Disp: , Rfl:    cholecalciferol  (VITAMIN D3) 25 MCG (1000 UNIT) tablet, Take 1,000 Units by mouth every evening., Disp: , Rfl:    gabapentin  (NEURONTIN ) 300 MG capsule, Take 600 mg by mouth at bedtime., Disp: , Rfl:    losartan  (COZAAR ) 25 MG tablet, Take 12.5 mg by mouth daily., Disp: , Rfl:    methylPREDNISolone  (MEDROL ) 2 MG tablet, Take 2 mg by mouth daily. 2 mg alternating with 4 mg daily, Disp: , Rfl:    Nintedanib  (OFEV ) 100 MG CAPS, Take 1 capsule (100 mg total) by mouth 2  (two) times daily., Disp: 60 capsule, Rfl: 5   ondansetron  (ZOFRAN ) 4 MG tablet, TAKE ONE TABLET BY MOUTH EVERY 8 HOURS AS NEEDED FOR NAUSEA AND VOMITING, Disp: 30 tablet, Rfl: 5   pantoprazole  (PROTONIX ) 40 MG tablet, Take 40 mg by mouth daily., Disp: , Rfl:    polyethylene glycol (MIRALAX  / GLYCOLAX ) 17 g packet, Take 17 g by mouth daily as needed for mild constipation., Disp: 14 each, Rfl: 0   pravastatin  (PRAVACHOL ) 20 MG tablet, Take 1 tablet (20 mg total) by mouth daily., Disp: 90 tablet, Rfl: 1   venlafaxine  XR (EFFEXOR -XR) 75 MG 24 hr capsule, Take 75 mg by mouth daily., Disp: , Rfl:    predniSONE  (DELTASONE ) 10 MG tablet, Take prednisone  40 mg daily x 2 days, then 20mg  daily x 2 days, then 10mg  daily x 2 days, then 5mg  daily x 2 days and then go to baseline medrol  dosing. While on prednisone  hold baseline medrol , Disp: 15 tablet, Rfl: 0      Objective:   Vitals:   01/03/24 0833  BP: (!) 108/58  Pulse: 81  SpO2: 95%  Weight: 125 lb (56.7 kg)  Height: 5' 5 (1.651 m)    Estimated body mass index is 20.8 kg/m as calculated from the following:   Height as of this encounter: 5' 5 (1.651 m).   Weight as of this encounter: 125 lb (56.7 kg).  @WEIGHTCHANGE @  [employer]   01/03/24 0833  Weight: 125 lb (56.7 kg)     Physical Exam   General: No distress. Looks bette O2 at rest: no Cane present: no Sitting in wheel chair: NO but has WALKER Frail: YES but bete Obese: no Neuro: [name] and Oriented x 3. GCS 15. Speech normal Psych: Pleasant Resp:  Barrel Chest - no.  Wheeze - yes scattered, Crackles - yes scattered, No overt respiratory distress CVS: Normal heart sounds. Murmurs - no Ext: Stigmata of Connective Tissue Disease - no HEENT:  Normal upper airway. PEERL +. No post nasal drip        Assessment:       ICD-10-CM   1. Interstitial lung disease due to connective tissue disease (HCC)  J84.89 CBC w/Diff   M35.9 IgE    Hepatic function panel    B Nat Peptide     Pulmonary function test    CBC w/Diff    IgE    Hepatic function panel    B Nat Peptide    Ambulatory referral to Rheumatology    2. Encounter for long-term current use of high risk medication  Z79.899 CBC w/Diff    IgE    Hepatic function panel    B Nat Peptide    Pulmonary function test    CBC w/Diff    IgE    Hepatic function panel    B Nat Peptide    Ambulatory referral to Rheumatology    3. Grade II diastolic dysfunction  I51.89 CBC w/Diff    IgE    Hepatic function panel    B Nat Peptide    Pulmonary function test    CBC w/Diff    IgE    Hepatic function panel    B Nat Peptide    Ambulatory referral to Rheumatology    4. Wheezing  R06.2 CBC w/Diff    IgE    Hepatic function panel    B Nat Peptide    Pulmonary function test    CBC w/Diff    IgE    Hepatic function panel    B Nat Peptide    Ambulatory referral to Rheumatology    5. History of anemia  Z86.2 CBC w/Diff    IgE    Hepatic function panel    B Nat Peptide    Pulmonary function test    CBC w/Diff    IgE    Hepatic function panel    B Nat Peptide    Ambulatory referral to Rheumatology    6. History of asthma  Z87.09 CBC w/Diff    IgE    Hepatic function panel    B Nat Peptide    Pulmonary function test    CBC w/Diff    IgE    Hepatic function panel    B Nat Peptide    Ambulatory referral to Rheumatology    7. Frequent falls  R29.6 CBC w/Diff    IgE    Hepatic function panel    B Nat Peptide    Pulmonary function test    CBC w/Diff    IgE    Hepatic function panel    B Nat Peptide    Ambulatory referral to Rheumatology    8. Rheumatoid arthritis involving multiple sites, unspecified whether rheumatoid factor present (HCC)  M06.9 CBC w/Diff    IgE    Hepatic function panel    B Nat Peptide    Pulmonary function test    CBC w/Diff    IgE    Hepatic function panel    B Nat Peptide    Ambulatory referral to Rheumatology    9. 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 is not responding but noted you have appt with new one Dr Lenon 01/21/24  - noted nearly  > 1 year since last Riuxan and your strong deisre to go back on it - also noted you prefer not to go to [facility] - continue daily medrol  long term - keep 01/11/24 Rheum appt with Dr Lenon at [facility] - check with them on Rituxan  restart  - with lung fucntion decline this might be a good idea to restart  - refer [facility] rheumatology to get local rheum esp with advancing age and frailty and living aline    PEDAL EDEMA  ECHO - no pulmonary hypertension Feb 2025 but has GRad 2 Diast Dysfunction BNP - normal FEb 2025  -Significantly improved on this visit 11/12/2023 and 01/03/2024 but still with residual edema   Plan Check BNP According to primary care physician  Wheezing Hz of asthma - normal allergy  test in 2019 Frequent exacerbation  - review labs show elevated eosinophils - always with rhonchii - noted mulitiple exacerbaion in 2025  Plan  - check ccbc with diff  - recheck IgE - -Continue Breo - if EOS/igEhigh (despite chronic medrol ) then consider  biologic  Falls Frequent Frailty  -The fall on 10/06/2023 is unrelated to her lung disease or oxygen  use - On 11/12/2023 1-2 more falls since visit end march 2025 - No falss since last visit in May 2025.  - great news  Plan - use walker  - continue PT    Followup -8 weeks- 30 min visit with Dr Barnett  -  Symptom score and exercise hypoxemia test  - PFT at followup-   - cancel visit if in research   FOLLOWUP Return in about 10 weeks (around 03/13/2024) for 30 min visit, with Dr Geronimo, Face to Face Visit, after Spiro and DLCO.    SIGNATURE    Dr. Dorethia Geronimo, M.D., F.C.C.P,  Pulmonary and Critical Care Medicine Staff Physician, [facility] Director - Interstitial Lung Disease  Program  Pulmonary Fibrosis [facility] at [facility] [place], [place], [address]  Pager: [phone], If no answer or between  15:00h - 7:00h: call [phone] Telephone: [phone]  9:14 AM 01/03/2024

## 2024-01-03 NOTE — Patient Instructions (Addendum)
 ILD due to RA   - progressive decline in lung function  as of May 2025 -Glad doing well tolerating the nintedanib  well with Zofran  and not having any nausea vomiting - clinically stable June 2025 compared to May 2025   Plan -  Continue nintedanib  -Continue oxygen  at night and with heavy exertion keeping pulse ox of 88%  = You do not need oxygen  at rest - take consent form for TETON 305 study due to progression -do spiro and dlco in 8-12 weeks - consider CT scan order at followup   History of rheumatoid arthritis  - too bad rheumatologist at [facility] is not responding but noted you have appt with new one Dr Lenon 01/21/24  - noted nearly  > 1 year since last Riuxan and your strong deisre to go back on it - also noted you prefer not to go to [facility] - continue daily medrol  long term - keep 01/11/24 Rheum appt with Dr Lenon at [facility] - check with them on Rituxan  restart  - with lung fucntion decline this might be a good idea to restart  - refer [facility] rheumatology to get local rheum esp with advancing age and frailty and living aline    PEDAL EDEMA  ECHO - no pulmonary hypertension Feb 2025 but has GRad 2 Diast Dysfunction BNP - normal FEb 2025  -Significantly improved on this visit 11/12/2023 and 01/03/2024 but still with residual edema   Plan Check BNP According to primary care physician  Wheezing Hz of asthma - normal allergy  test in 2019 Frequent exacerbation  - review labs show elevated eosinophils - always with rhonchii - noted mulitiple exacerbaion in 2025  Plan  - check ccbc with diff  - recheck IgE - -Continue Breo - if EOS/igEhigh (despite chronic medrol ) then consider biologic  Falls Frequent Frailty  -The fall on 10/06/2023 is unrelated to her lung disease or oxygen  use - On 11/12/2023 1-2 more falls since visit end march 2025 - No falss since last visit in May 2025.  - great news  Plan - use walker  - continue  PT    Followup -8 weeks- 30 min visit with Dr Barnett  -  Symptom score and exercise hypoxemia test  - PFT at followup-   - cancel visit if in research

## 2024-01-04 LAB — IGE: IgE (Immunoglobulin E), Serum: 4 kU/L (ref <=114)

## 2024-01-07 ENCOUNTER — Telehealth: Payer: Self-pay | Admitting: *Deleted

## 2024-01-07 NOTE — Telephone Encounter (Signed)
 Copied from CRM [id number]. Topic: General - Other >> Jan 05, 2024 10:09 AM Rozanna MATSU wrote: Reason for CRM: PT DAUGHTER [name] CALLED WANTED DR RAMASWAMY OR HIS NURSE TO CALL SHE WASN'T ABLE TO MAKE THE APPT WITH HER AND WOULD LIKE TO SPEAK WITH YOU ALL. PLEASE CALL MOMS NUMBER TO SPEAK WITH THEM >> Jan 05, 2024 10:12 AM Rozanna G wrote: THE DAUGHTER IS NOT LIST ON HER DPR  Patient has an appointment scheduled for 02/11/24 at 8:30 am for PFT and 9 am for OV.  Nothing further needed.

## 2024-01-19 ENCOUNTER — Other Ambulatory Visit: Payer: Self-pay | Admitting: Internal Medicine

## 2024-01-21 DIAGNOSIS — M05711 Rheumatoid arthritis with rheumatoid factor of right shoulder without organ or systems involvement: Secondary | ICD-10-CM | POA: Diagnosis not present

## 2024-01-21 DIAGNOSIS — Z7960 Long term (current) use of unspecified immunomodulators and immunosuppressants: Secondary | ICD-10-CM | POA: Diagnosis not present

## 2024-01-21 DIAGNOSIS — J849 Interstitial pulmonary disease, unspecified: Secondary | ICD-10-CM | POA: Diagnosis not present

## 2024-01-24 DIAGNOSIS — J449 Chronic obstructive pulmonary disease, unspecified: Secondary | ICD-10-CM | POA: Diagnosis not present

## 2024-01-24 DIAGNOSIS — E785 Hyperlipidemia, unspecified: Secondary | ICD-10-CM | POA: Diagnosis not present

## 2024-01-24 DIAGNOSIS — F419 Anxiety disorder, unspecified: Secondary | ICD-10-CM | POA: Diagnosis not present

## 2024-01-24 DIAGNOSIS — I2510 Atherosclerotic heart disease of native coronary artery without angina pectoris: Secondary | ICD-10-CM | POA: Diagnosis not present

## 2024-01-24 DIAGNOSIS — M810 Age-related osteoporosis without current pathological fracture: Secondary | ICD-10-CM | POA: Diagnosis not present

## 2024-01-24 DIAGNOSIS — I2584 Coronary atherosclerosis due to calcified coronary lesion: Secondary | ICD-10-CM | POA: Diagnosis not present

## 2024-01-24 DIAGNOSIS — I872 Venous insufficiency (chronic) (peripheral): Secondary | ICD-10-CM | POA: Diagnosis not present

## 2024-01-24 DIAGNOSIS — J849 Interstitial pulmonary disease, unspecified: Secondary | ICD-10-CM | POA: Diagnosis not present

## 2024-01-24 DIAGNOSIS — M069 Rheumatoid arthritis, unspecified: Secondary | ICD-10-CM | POA: Diagnosis not present

## 2024-01-24 DIAGNOSIS — I87319 Chronic venous hypertension (idiopathic) with ulcer of unspecified lower extremity: Secondary | ICD-10-CM | POA: Diagnosis not present

## 2024-01-24 DIAGNOSIS — M0510 Rheumatoid lung disease with rheumatoid arthritis of unspecified site: Secondary | ICD-10-CM | POA: Diagnosis not present

## 2024-01-24 DIAGNOSIS — I771 Stricture of artery: Secondary | ICD-10-CM | POA: Diagnosis not present

## 2024-01-26 ENCOUNTER — Telehealth (HOSPITAL_BASED_OUTPATIENT_CLINIC_OR_DEPARTMENT_OTHER): Payer: Self-pay

## 2024-01-26 NOTE — Telephone Encounter (Signed)
 Copied from CRM [id number]. Topic: Clinical - Prescription Issue >> Jan 26, 2024  3:09 PM Leila C wrote: Reason for CRM: Levon from [facility] [phone] is trying to deliver medication Ofev  and cannot get a hold of the patient.

## 2024-01-31 NOTE — Telephone Encounter (Signed)
 Called patient and advised of notification from pharmacy. Unable to reach. Left VM with phone number for [facility] to call and schedule shipment if due  Sherry Pennant, PharmD, MPH, BCPS, CPP Clinical Pharmacist (Rheumatology and Pulmonology)

## 2024-02-11 ENCOUNTER — Ambulatory Visit (INDEPENDENT_AMBULATORY_CARE_PROVIDER_SITE_OTHER): Admitting: Internal Medicine

## 2024-02-11 ENCOUNTER — Encounter: Payer: Self-pay | Admitting: Internal Medicine

## 2024-02-11 ENCOUNTER — Ambulatory Visit: Admitting: Internal Medicine

## 2024-02-11 VITALS — BP 120/64 | HR 74 | Ht 65.0 in | Wt 124.4 lb

## 2024-02-11 DIAGNOSIS — J8283 Eosinophilic asthma: Secondary | ICD-10-CM

## 2024-02-11 DIAGNOSIS — J4540 Moderate persistent asthma, uncomplicated: Secondary | ICD-10-CM

## 2024-02-11 DIAGNOSIS — Z862 Personal history of diseases of the blood and blood-forming organs and certain disorders involving the immune mechanism: Secondary | ICD-10-CM

## 2024-02-11 DIAGNOSIS — Z8709 Personal history of other diseases of the respiratory system: Secondary | ICD-10-CM

## 2024-02-11 DIAGNOSIS — J9611 Chronic respiratory failure with hypoxia: Secondary | ICD-10-CM | POA: Diagnosis not present

## 2024-02-11 DIAGNOSIS — R062 Wheezing: Secondary | ICD-10-CM

## 2024-02-11 DIAGNOSIS — R296 Repeated falls: Secondary | ICD-10-CM

## 2024-02-11 DIAGNOSIS — J8489 Other specified interstitial pulmonary diseases: Secondary | ICD-10-CM | POA: Diagnosis not present

## 2024-02-11 DIAGNOSIS — I5189 Other ill-defined heart diseases: Secondary | ICD-10-CM

## 2024-02-11 DIAGNOSIS — Z7952 Long term (current) use of systemic steroids: Secondary | ICD-10-CM

## 2024-02-11 DIAGNOSIS — Z79899 Other long term (current) drug therapy: Secondary | ICD-10-CM

## 2024-02-11 DIAGNOSIS — M359 Systemic involvement of connective tissue, unspecified: Secondary | ICD-10-CM

## 2024-02-11 DIAGNOSIS — Z5181 Encounter for therapeutic drug level monitoring: Secondary | ICD-10-CM

## 2024-02-11 DIAGNOSIS — M069 Rheumatoid arthritis, unspecified: Secondary | ICD-10-CM

## 2024-02-11 LAB — PULMONARY FUNCTION TEST
DL/VA % pred: 71 %
DL/VA: 2.88 ml/min/mmHg/L
DLCO cor % pred: 51 %
DLCO cor: 9.87 ml/min/mmHg
DLCO unc % pred: 47 %
DLCO unc: 9.17 ml/min/mmHg
FEF 25-75 Pre: 0.8 L/s
FEF2575-%Pred-Pre: 63 %
FEV1-%Pred-Pre: 61 %
FEV1-Pre: 1.16 L
FEV1FVC-%Pred-Pre: 100 %
FEV6-%Pred-Pre: 65 %
FEV6-Pre: 1.57 L
FEV6FVC-%Pred-Pre: 106 %
FVC-%Pred-Pre: 61 %
FVC-Pre: 1.57 L
Pre FEV1/FVC ratio: 74 %
Pre FEV6/FVC Ratio: 100 %

## 2024-02-11 NOTE — Patient Instructions (Signed)
Spiro/DLCO performed today. 

## 2024-02-11 NOTE — Patient Instructions (Addendum)
 ILD due to RA Encounter for therapeutic monitoring   - Lung function is improved in August 2025 compared to May 2025 but overall compared to 2022, 2023 and summer 2024 it is progressive -Glad doing well tolerating the nintedanib  well with Zofran  and not having any nausea vomiting   Plan -  Continue nintedanib  low-dose protocol  - Normal liver function test June 2025; will repeat in 3 months at follow-up -Continue oxygen  at night and with heavy exertion keeping pulse ox of 88%  = You do not need oxygen  at rest - Respect not wanting to do TETON 305 inhaled treprostinil versus placebo study due to inconvenience -do spiro and dlco in 12 weeks  - If there is continued progression then we have to discuss options such as immunomodulatory add-on [see rheumatology section]    History of rheumatoid arthritis  -  - noted nearly  > 1 year since last Riuxan  - Glad you saw Dr. Lenon rheumatology on  01/21/2024 took a shared decision making not to reinitiate Rituxan    Plan - continue daily medrol  long term - Keep [facility] rheumatology in late part of 2025 to get local rheum esp with advancing age and frailty  - At this point in time we can take a decision about reinitiating Rituxan  or initiating tocilizumab at least based on ILD status     PEDAL EDEMA  ECHO - no pulmonary hypertensio n Feb 2025 but has GRad 2 Diast Dysfunction BNP - normal FEb 2024 and June 2025  -Significantly improved on this visit 11/12/2023 and 01/03/2024 and 02/11/2024.   Plan According to primary care physician  Wheezing Hz of asthma - normal allergy  test in 2019 Frequent exacerbation  - review labs show elevated eosinophils - always with rhonchii - noted mulitiple exacerbaion in 2025  Plan  -  -Continue Breo - ADD [employer]  Falls Frequent Frailty  -The fall on 10/06/2023 is unrelated to her lung disease or oxygen  use - On 11/12/2023 1-2 more falls since visit end march 2025 - No falss since last  visit in May 2025.  - great news  Plan - use walker  - continue PT -per PCP    Followup -12 weeks- 30 min visit with Dr Barnett  -  Symptom score and exercise hypoxemia test  - PFT at followup-

## 2024-02-11 NOTE — Progress Notes (Signed)
Spiro/DLCO performed today. 

## 2024-02-11 NOTE — Progress Notes (Signed)
 Brief patient profile:  60 yowf  never smoker with allergies/inhalers as child outgrew by [employer] then  RA since around 2000  Prednisone   x decades and prev eval by Dr Brien around 2004 for sob resolved s maint rx and referred 05/26/2013 by Dr Clarice for bronchitis and abn cxr   History of Present Illness  05/26/2013 1st [facility] office visit/ Wert cc June 2014 dx pna  In [place] and remicade stopped and 100% better and placed arencia in September 2014  then abruptly worse first week in November with cough green sputum s nasal symptoms, fever low grade and no cp or cough and completely recovered prior to OV does not recall abx but issue is why keeps getting sick and abn CT Chest (see below).   Arthritis symptoms well controlled at present on Rx for RA rec Nexium 40 mg Take 30-60 min before first meal of the day and add pepcid  20 mg one at bedtime whenever coughing.     10/19/2017  f/u ov/Wert re:  RA lung dz  Chief Complaint  Patient presents with   Follow-up    Cough is much improved, but has not resolved yet. Cough is non prod. She has not had to use her neb.   Dyspnea:  Not limited by breathing from desired activities  But some doe x steps Cough: daytime > noct dry  Sleep: fine  SABA use:  No saba Medrol  4 mg a/w 2mg  per day/ ok control of arthritis  rec Start back on gabapentin  up to 300 mg each am  in addition to the the two at bedtime  If not better increase the medrol  to 8 mg daily until bettter then taper back to where you      01/10/2018  f/u ov/Wert re:  RA  Lung dz Medrol   4 mg  One alternating with a half Chief Complaint  Patient presents with   Follow-up    PFT's done. Her breathing has been gradually worsening since the last visit. She has occ cough- non prod.   Dyspnea gradually worse since last ov:  MMRC1 =  MMRC3 = can't walk 100 yards even at a slow pace at a flat grade s stopping due to sob    Gradually x 3 m / more fatigue / no change in  arthritis  Cough: not an issue rec Protonix  40 mg Take 30-60 min before first meal of the day  GERD diet   01/17/2018 acute extended ov/Wert re: cough on medrol  4 mg  One a/w one half  Chief Complaint  Patient presents with   Acute Visit    started coughing 01/11/18- occ prod with minimal green sputum.  She states also wheezing and having increased SOB.    abruptly worse 01/11/18 with severe 24/7 coughing >>  prod min green mucus esp in am/ assoc with subjective wheeze and did not follow previous contingencies re flutter / saba/ increase medrol  and admits she does not rember those written instructions nor how to use the neb provided .  No fever/ comfortable at rest sitting  rec For cough > mucinex  dm 1200 mg every 12 hours and cough into the flutter valve as much as possible  Doxycycline  100 mg twice daily x 10 days with glass of water Medrol  4mg  x 2 now and take 2  daily until cough is better then 1 daily x 5 days and then resume the previous dose  Shortness of breath/ wheezing/ still coughing >  albuterol  neb every 4 hours as needed      01/20/2018 acute extended ov/Wert re: refractory cough and sob 01/11/18 Chief Complaint  Patient presents with   Acute Visit    she is not feeling better, coughing , very SOB, wheezing  mucus now clear/scant  on doxy/ neb machine not working (tube would not plug into the side s adequate force and she was not capable of applying it due to RA hands. Cough/ wheeze/ sob 24/7 / flutter not helping/ can't lie down at hs   rec While coughing protonix  40 mg Take 30- 60 min before your first and last meals of the day  Shortness of breath/ wheezing/ still coughing > albuterol  neb every 4 hours as needed  Depomedrol 120 mg IM and medrol  32 mg daily x 2 days,  then 16 mg x 3 days,  Then 8 mg x 4 days , then resume the 4 mg daily  For severe cough > tylenol  3# one every 4 hours if needed  Go to ER if condition worsens on above plan       Date of admission: 01/22/2018              Date of discharge: 01/27/2018   History of present illness: As per the H and P dictated on admission,  Stacey [name]  is a 83 y.o. female, w Rheumatoid arthritis, ILD Asthma, apparently c/o increase in dyspnea this evening. Dry cough.   Denies fever, chills, cp, palp,  N/v, diarrhea, brbpr, black stool.   Pt notes recently being given steroid injection in office as well as being placed on doxycycline . This might have helped slightly but pt worse  Hospital Course:  Summary of her active problems in the hospital is as following. 1 dyspnea/hypoxemia/ILD Concerned that likely GERD Is causing ILD. Patient with cough.   Patient with complaints of awakening with cough and also with oral intake which is slightly improved since 01/24/2018. assessed by speech therapy and speech therapy raising concern of esophageal component but no signs of aspiration.   2D echo with a EF of 55 to 60% with no wall motion abnormalities, grade 1 diastolic dysfunction.  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 Pulmonary felt that the patient should be on scheduled Reglan . I have placed the patient on scheduled potassium before sleep. Continue steroids on discharge continue Mucinex  and Claritin  as well as inhalers. Patient will follow-up with pulmonary outpatient   2.  Gastroesophageal reflux disease Continue PPI and H2 blocker.  I changed PPI to AC.   3.  Rheumatoid arthritis Outpatient follow-up.     4.  Anxiety Continue Effexor .       All other chronic medical condition were stable during the hospitalization.  Patient was ambulatory without any assistance. On the day of the discharge the patient's vitals were stable , and no other acute medical condition were reported by patient. the patient was felt safe to be discharge at home with family.   Consultants: PCCM  Procedures: Echocardiogram       03/21/2018  f/u ov/Wert re:   S/p admit was transiently  better  and downhill since Labor day on medrol  4 mg daily  Last orencia  on Sept 4th 2019  Chief Complaint  Patient presents with   Acute Visit    Per patient, she has had a dry cough since July 2019. She has been wheezing as well. Increased fatigue. Body aches. Denies any fever or chills.   Dyspnea:  MMRC4  = sob if tries to leave home or while getting dressed   Cough: harsh/ hacking mostly dry/ has flutter not using    SABA use: not much better with rx   No obvious day to day or daytime variability or assoc excess/ purulent sputum or mucus plugs or hemoptysis or cp or chest tightness, subjective wheeze or overt sinus or hb symptoms.     Also denies any obvious fluctuation of symptoms with weather or environmental changes or other aggravating or alleviating factors except as outlined above   No unusual exposure hx or h/o childhood pna/ asthma or knowledge of premature birth.   INpatient consult 03/26/18 83 year old with rheumatoid arthoritis.At baseline the patient lives at home with her husband and is independent of ADLs.  Has been on many immune suppressants over decades and curently on orencia  x 4 year and prednisone . Does not recollect being on bactrim /dapsone. Chart mentions BOOP/MAI in 2001 but she denies this. 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 Got admitted. Rx with steroids. Per Notes - clnical suspicion of  arytenoid inflmmation related wheeze noticed (she also reports asthma NOS). Follolwup with ENT recommended (but not seen one as yet). She also appears to have passed swallow with rec for regular diet with thin liquids but did to have mild eso stricture and reflux during testing . PFT shows 10% FVC decline for first ime. CT chest at this tme (aug 2019) showed new rLL infiltrate.  ECHO July 2019 without evicence of elevated  PASP and saw cards [facility] June 2019 and was considered to have worsenin dyspnea due to [facility] issues (reports stress test at [facility] that was normal but I cannot see it)   She reports after discharge she got better but in last several weeks has deteriorated with cough and dyspnea. There is new hypoxemia (currently RA with nail polish and poor circulation  - 89% pulse ox) needing 2L [place]. Per [facility] improved with steroids and abx. CTA 03/24/2018 => shows that RLL inifltrate has improved . Other chronic ILD changes + and small  Hiatal hernia + witthout change.   Review of lab work does show eosinophilia at time of admision   EVENTS 03/21/18 - IgE -5, blood allegy panel - negative, 03/23/2018 - - admit . HIGH EOS 2300, ESR 48, BNP 89 , HIV neg 9/12- PCT negative, RVP negative 9/14 -  PCT < 0.1, Urine strep - negative, MRSA PCR - positive. IgE - normal 4, Blood allergy  panel repeat - negative 03/26/2018 - leading consideration for airway (BO in RA +/- asthma) related flare either due to MRSA bronchitis or clinical suspicion of arytenoid inflmmation +/- GERD relatd flare (she has small hiatal hernia)  +/- ? Dysphagia  up causing mild hypoxemia acute resp failure, wheeze . Allergy  and IgE blood work negative thought. Patient reports being better but says she is choking on drinkin water [facility] MD says wheeze improved significantly with steroids.  RN says was down to RA yesterday evening but needed 1L [place] at sleep. Today -Room air at rest 94% and desaturated to 86% walking 60 feet 03/27/2018  - better. Off o2 at rest. STill coughs with water and when lies down.  Husband at bedside. Both requesting ILD clinic followup . Desaturated t 79% walking 90 feet.   OV 04/12/2018  Subjective:  Patient ID: Stacey [name] Alert, female , DOB: 1941/06/23 , age 47 y.o. , MRN: [id number] , ADDRESS: [address] [place] [address]   04/12/2018 -   Chief Complaint  Patient presents with   Consult    Pt is a former MW pt.  Pt  denies any current complaints of cough, SOB, or CP but states the cough she originally had ended her up in the hosp 9/11-9/17 with dx acute respiratory failure. Pt does wear 2pulse with exertion and also wears 2L continuous when at home.     HPI LILLEY HUBBLE 83 y.o. -presents for follow-up to the ILD clinic.  She is known to have rheumatoid arthritis with ILD changes.  She had been followed by Dr. Ozell America.  However in July 2019 in September 2019 she has had 2 admissions to the hospital with respiratory distress and hypoxemic respiratory failure.  In the first 1 that seem to be right lower lobe infiltrate and then she improved from it but in the second 1 even though the right lower lobe infiltrates were better she still was hypoxemic.  Acid reflux and dysphagia was considered a possible etiology but she passed swallow study 2 times.  They thought she had some reflux.  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  At the same time it is not clear if the ILD is progressive based on pulmonary function testing below   At this point in time she tells me that she is getting home physical therapy.  Her fatigue is improving but it is not fully resolved.  She was discharged on continuous oxygen  which she is using.  However she is feeling less short of breath.  Today in fact when we turned her oxygen  off and walked her she did not desaturate and this is a significant improvement.  She is on monthly Orencia  through the [facility] rheumatologist Dr. Delon Gosling.  At this point in time she is put the Orencia  on hold.  She told me that she is been on Orencia  for 4 years and never had a respiratory exacerbation still recently x 2.  Although before going on Orencia  she had pneumonia while on Remicade and the Remicade.  In terms of her rheumatoid arthritis she hardly has any pain.  Her joint architecture is fairly well-preserved because of various immunomodulators over  time.  She says that she was on Remicade for years and when she stopped it for 8 weeks before the switch to Orencia  she never really had a relapse in her rheumatoid arthritis.  She is largely pain and stiffness free.  She believes she can go without  her Orencia  for a while.  Review of the literature shows greater than 10% chance of a respiratory infection especially COPD exacerbation.  Although the time frame for this is unclear.       OV 06/07/2018  Subjective:  Patient ID: Stacey [name] Alert, female , DOB: 07-15-1940 , age 29 y.o. , MRN: [id number] , ADDRESS: [address] [place] [address]   06/07/2018 -   Chief Complaint  Patient presents with   Follow-up    ILD, PFT done today, some wheezing and coughing but better tha before   Rheumatoid arthritis ILD and asthma/obstructive lung disease phenotype on Dulera   HPI Stacey [name] 83 y.o. -presents for routine follow-up with her husband.  She is here to follow-up with [facility] Dr. Gosling.  She plans to do this in  December 2019.  She continues to be off Orencia .  Her joints are slowly getting stiff again.  She believes that she will need to be back on immunosuppression agent again.  She currently continues Medrol  4 mg alternating with 2 mg.  This for her rheumatoid arthritis.  In terms of her joints she continues on Medrol  4 mg alternating with 2 mg but not on any other immunosuppression agent.  Overall she is been stable but for the last 2 weeks has had green sputum and wheezing and chest congestion and cough.  She recently visited her husband who was hospitalized and walking the long hallways at [facility] made a short of breath but she thinks this is probably baseline for her.  There are no other new issues.  She did have spirometry and DLCO and this shows a decline compared to September 2019 and a similar to July 2019.  It is documented below.  This is probably reflective of a flareup   OV  07/21/2018  Subjective:  Patient ID: Stacey [name] Alert, female , DOB: [birth year] , age 65 y.o. , MRN: [id number] , ADDRESS: [address] [place] [address]   07/21/2018 -   Chief Complaint  Patient presents with   Follow-up    Pt states she has been doing well since last visit. States she is about to begin Rituxan  with [facility]. Pt still becomes SOB with exertion. Denies any complaints of cough or CP.   Rheumatoid arthritis ILD and asthma/obstructive lung disease phenotype on Dulera   HPI Stacey [name] 83 y.o. -presents for follow-up of the above.  Last seen just before Thanksgiving 2019.  In the interim overall stable although on June 23, 2018 she climbs a steep flight of stairs which is unusual exertion for her and she became very dyspneic.  Following day saw Dr. Sharl at [facility] rheumatology and was given Z-Pak and prednisone  and started feeling better.  Although it is not fully clear to me she had fever and bronchitic symptoms.  I reviewed Dr. Sharl note.  Dr. Sharl is decided to start Rituxan  for rheumatoid arthritis.  She is only having some minimal joint pain at this point.  She is off Orencia  and continues to be off Orencia .  She did have some blood work with us  before starting Rituxan .  She is due to see Dr. Sharl within the next week and start her Rituxan .  Her liver function test July 18, 2017 is normal hemoglobin is normal.  CRP is also normal.  We did spirometry and walking desaturation test.  These show improvement compared to before and these are documented below.  Currently she not using nighttime or daytime oxygen .  She is wondering if she could switch rheumatology care to [facility].  This is because while she likes [employer] she is getting older and more frail and feels some body local would be of help.  I have sent a message to Dr. Dolphus inquiring.  Certainly we can help her with Rituxan  infusions at [facility] health system if needed.  She  will check on this with her [facility] rheumatologist.       OV 03/27/2019  Subjective:  Patient ID: Stacey [name] Alert, female , DOB: 26-Feb-1941 , age 2 y.o. , MRN: [id number] , ADDRESS: [address] [place] [address]  Rheumatoid arthritis ILD and asthma/obstructive lung disease phenotype on Dulera    03/27/2019 -  Rourine fu   HPI Stacey [name] 83 y.o. -presents for  the above.  Last seen in January 2020.  After that she has seen Dr. Sharl rheumatology at [facility].  She is now getting Rituxan  2 doses every 6 months.  She says this is helped her joints and her stiffness.  She is a little bit more mobile than usual.  However in terms of her respiratory status she continues to have episodic cough.  In June 2020 she again got hypoxemic and got admitted.  Since then she has had episodic cough.  She had a respiratory exacerbation in June 2020 for the admission she got steroids.  This seemed to help.  She is also on a higher dose Dulera  right now.  In terms of her cough this seems to be her biggest problem.  She seems to be on Dulera , Singulair  scheduled with also Tessalon  and Delsym  and DuoNeb.  Noticed that she is on gabapentin  Elavil  and Effexor  but I think this is all from neuropathy and other issues and not primarily indicated for cough.  Her last high-resolution CT scan of the chest was 1 year ago.  She says the dyspnea itself is not worse.  She is currently not using oxygen .  On exam she did have some wheezing.  Currently she has white and brown sputum but this is baseline.  In terms of a COVID wrist she has been tested recently couple of times and this is been negative.  She is isolating well.  She wanted to know COVID prevention activities and risk status and masking strategies.      OV 05/24/2019  Subjective:  Patient ID: Stacey [name] Alert, female , DOB: Jul 20, 1940 , age 19 y.o. , MRN: [id number] , ADDRESS: [address] [place] [address]   05/24/2019 -   Chief  Complaint  Patient presents with   Follow-up    Pt was recently in the hosp due to ILD. Pt states that she has been better since being out of the hosp.   Rheumatoid arthritis ILD and asthma/obstructive lung disease phenotype on Dulera /Singulair   Post herpetic neuralgia - on elavil , gabapentin , effexor   HPI Stacey [name] 83 y.o. -returns for follow-up.  At the last visit approximately 2 months ago she was reporting worsening cough following an admission in summer 2020.  Her pulmonary function test suggested worsening ILD status.  Therefore we requested a high-resolution CT chest which she did in October 2020.  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  However in the interim after the CT scan was done towards the end of October 2020 she developed worsening of her cough over 2 weeks and also associated shortness of breath but significantly the cough is much worse.  She ended up getting admitted to the hospital.  There was some hypoxemia.  By this time she had finished a ENT evaluation that did not show any involvement of the arytenoids.  Pulmonary was consulted.  She was given a prednisone  burst which she just finished I believe yesterday.  She is back on her baseline Medrol .  And she is feeling better.  Her oxygen  status is improved although she is using oxygen  at night now.  She is really frustrated with these recurrent flareups and these admissions which ended up with her having a wheeze and also hypoxemia.  Currently she is on her baseline Medrol  for rheumatoid arthritis associated with Dulera  and Singulair .  She is also on losartan  for blood pressure.  Her walking desaturation test is slightly worse than baseline.  She has a GI consult pending because of the recurrent episodes of cough and flareups.  We went over exposure history.  We used interstitial lung disease questionnaire for the exposure history.  Specifically she denies any electronic cigarette use of  marijuana use of cocaine use or any IV drug abuse.  She [place] the last 14 years.  Asked extensive questions about the home environment it is positive for nebulizer use but the nebulizer does not have mildew or mold in it.  Otherwise no organic antigen exposure.  The house is not damp.  There is no mold or mildew in the shower curtain.  There is no humidifier use no steam iron use.  No Jacuzzi use.  No misting [place] outside to inside the house.  No pet birds.  No pet gerbils no feather pillows.  There is no mold in the [facility] duct.  She does not do any gardening.  Does not use wind instruments.  Also 122 question occupational history elicited and essentially negative.  The other issue is that she has polypharmacy.  She is asking for my help in reducing her medications.  She is on 3 medications for postherpetic neuralgia.  She is on losartan       OV 06/21/2019  Subjective:  Patient ID: Stacey [name] Alert, female , DOB: 1941/03/13 , age 70 y.o. , MRN: [id number] , ADDRESS: [name] [place] [address]   06/21/2019 -   Chief Complaint  Patient presents with   televisit    hosp 11/29-12/4 due to covid with pna. pt said that she is doing okay after recent hosp but states she has no energy.      DANYELL SHADER 83 y.o. - last visit 05/24/2019. Diagnosed with covid-19 on 05/31/2019. Admitted 06/11/2019  - 06/16/2019. Quarantine ends 06/21/2019 today. Rx with Remdesviri and Decadron . Husband also on phone. Questions  1. Quarantine ends - from 06/22/19\ and she is not contagious and likely resistant to reinfection to covid for another few months  2. She is on 10 day dexamethasone  for covid - today is last day for it  3. She is on dulera . Hospital gave combivent  and she does not want do combivent  - this is fine  4. Ofev  for ILD - not started it yet . She had questions about side effects. Explained GI and LFT monirtong. SHe wanted to  wait till Jan 2021 and start it . I am ok with that.   5. Small hiat   IMPRESSION: HRCT OCt 2020 1. 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 Findings have progressed since 2017 and 2019 high-resolution chest CT studies. 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 Findings are consistent with UIP per consensus guidelines: Diagnosis of Idiopathic Pulmonary Fibrosis: An Official ATS/ERS/JRS/ALAT Clinical Practice Guideline. Am JINNY Honey Crit Care Med Vol 198, Iss 5, [phone], Mar 13 2017. 2. 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 4. Small hiatal hernia.   Aortic Atherosclerosis (ICD10-I70.0).     Electronically Signed   By: Selinda DELENA Blue M.D.   On: 04/24/2019 13:29    OV 08/03/2019 - face to face visit  Subjective:  Patient ID: Stacey [name] Alert, female , DOB: May 04, 1941 , age 68 y.o. , MRN: [id number] , ADDRESS: [address] [place] [address]    Rheumatoid arthritis with Joint s/ILD- Rituxan kathie ([facility])  - progressive phenotype (CT 2017/2019 -> Oct 2020). Planned Ofev  start in Nov 2020 but did not start as of dec 2020. LFT normal Jan 2021  Athma/obstructive lung disease phenotype on Dulera /Singulair   Post herpetic neuralgia - on elavil , gabapentin , effexor   Coronary Artery Calcification - 1 vessel - cardiology referral done  Diagnosed with covid-19 on 05/31/2019. Admitted 06/11/2019  - 06/16/2019. Rx with Remdesviri and Decadron .  Small Hiatal hernia onCT Oct 2020  Rib fracture from fall Dec 2020: Musculoskeletal: There is nondisplaced fractures of the posterior left fifth, 6, 8, 9, and tenth left ribs. There is unchanged slight superior compression deformities of the T5, T6, T10 vertebral bodies with less than 25% loss in height.Associated pulmnary contusion  +    HPI Stacey [name] 83 y.o. -presents for a visit. Her nintedanib  has been delayed because of COVID-19. Also subsequently a fall. She saw a nurse practitioner on July 20, 2019 and they took a shared decision to start nintedanib . She had a ? telephone visit with Dr. Delon Jama Gosling the rheumatologist at [facility]. Patient scheduled for her next Rituxan  in February 2021 but reviewed the notes indicates this could be pushed to early spring 2021. Dr. Gosling is okay with the patient study got intubated at this point in time.  Patient has follow-up with Dr. Gosling tomorrow at [facility].  At this face-to-face visit she is here with her husband.  She says she is much better after the Covid and also the fall that fractured her ribs.  Nevertheless all this is left her fatigue.  Infective fatigue scores are much worse.  Also in the last 2 weeks has had increased cough wheezing and shortness of breath.  The sputum was also changed color in the last 1 week to clear green.  This is all new.  Her symptom score is therefore a worse than her baseline.  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  She is only taking 100 mg once a day.  The plan was to go up to 100 mg twice a day next week.  This is a minimum effective dose.  The maximum dose is 150 mg twice a day.  Given her age and comorbidities we are starting at a low dose.  Part of this visit is to make sure that she is tolerating the drug fine.  And so far she is.  She is interested in the ILD-pro registry study done by the [facility] clinical research Institute.   She is working with physical therapy for her fatigue.  Ambulatory Walk 07/20/2019 2 Lap- O2 92% RA; HR 109 No shortness of breath She did not need to stop Used walker    OV 09/20/2019  Subjective:  Patient ID: Stacey [name] Alert, female , DOB: [birth year] , age 27 y.o. , MRN: [id number] , ADDRESS: [address] [place]  [place] [address]   09/20/2019 -   Chief Complaint  Patient presents with   Televisit    Called and spoke with pt who stated she has been feeling okay since last visit. Pt stated she is still taking OFEV  and denies any complaints. Pt states she believes her breathing is stable at this point.    Rheumatoid arthritis with Joint s/ILD- Rituxan /steroids (duke uinviesty)  - progressive phenotype (CT 2017/2019 -> Oct 2020). Planned Ofev  start in Nov 2020 but did not start as of dec 2020. 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 Admitted 06/11/2019  - 06/16/2019. Rx with Remdesviri and Decadron .  Small Hiatal hernia on CT Oct 2020  Rib fracture from fall Dec 2020: Musculoskeletal: There is nondisplaced fractures of the posterior left fifth, 6, 8, 9, and tenth left ribs. There is unchanged slight superior compression deformities of the T5, T6, T10 vertebral bodies with less than 25% loss in height.Associated pulmnary contusion +   HPI TANIESHA GLANZ 83 y.o. - has 2nd covid vaccine later this week. Rituxan  is end of the month. Baseline RA regiment is On dulera  for associated obstructio of lung. Continue ofev  100mg  bid since mid jan 2021 for ILD. No side effects. Did see Landry Ferrari for face to face visit early feb 2021 -> for bronchitis and felt better after prdnisone and zpak. Currently on medrol    8 mg day and staying there per Landry Ferrari . Wants t oknow if she can taper. Wants to know how she can prevent care flare ups. Explained ofev , masking and social distancing  prevent respiratory infection.Denies choking on food or aspirating. Denies mold in house - relatively news. Denies dog. Denies cat. Denies carious teeth. Denies post nasal drip  OVerll feels better and improved dyspnea.     OV 12/18/2019  Subjective:  Patient ID: Stacey [name] Alert, female , DOB: 1940/10/02 , age 33 y.o. , MRN: [id number] , ADDRESS: [address] [place] [address]  PCP Onita Rush, MD Rheumatologist-Dr. Delon Gosling at [facility]/ILD: Dr. Geronimo Morita  12/18/2019 -   Chief Complaint  Patient presents with   Follow-up    no worse   Rheumatoid arthritis with Joint s/ILD- Rituxan /steroids (duke uinviesty)  - progressive phenotype (CT 2017/2019 -> Oct 2020). Planned Ofev  start in Nov 2020 but did not start as of dec 2020. 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 Admitted 06/11/2019  - 06/16/2019. Rx with Remdesviri and Decadron .  Small Hiatal hernia on CT Oct 2020  Rib fracture from fall Dec 2020: Musculoskeletal: There is nondisplaced fractures of the posterior left fifth, 6, 8, 9, and tenth left ribs. There is unchanged slight superior compression deformities of the T5, T6, T10 vertebral bodies with less than 25% loss in height.Associated pulmnary contusion +  -On Reclast  once a year through Dr. Delon Gosling   HPI Stacey [name] Alert 83 y.o. 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  It has been a few month since I last saw her.  In this time she did not taper her Medrol  down.   She stated 8 mg/day.  She was  originally taking the Medrol  for her rheumatoid arthritis for many years.  She recently increased the dose from 4 mg / 2 mg every other day to 8 mg daily because of recurrent respiratory exacerbations.  She is asking for Dulera  refills for obstructive lung disease.  She is getting Rituxan  through Dr. Delon Gosling for rheumatoid arthritis.  Her next Rituxan  is in September 2021.  She saw Dr. Gosling in April 2021 and reviewed the note.  Her liver function test at the time was fine.  In terms of her ILD she continues on nintedanib  100 mg twice daily.  She says she has no side effects from the drug she is tolerating it quite well.  We discussed about increasing the dose but she wants to hold off because she just got a new supply.  However she is open to increasing the dose when the supply runs out.  She is due for liver function test today.  In terms of overall symptoms things have improved as can be seen below and the symptom score.SABRA  However her main concern is that of fatigue.  She is open to attending pulmonary rehabilitation.  She is not using oxygen  at home.  Sometime back when we tested overnight oxygen  desaturation test she did not desaturate.  Walking desaturation test today stable.  She does notice of note that her blood pressure has gone up.  She is working with her primary care physician on this.  She is on losartan .    OV 04/10/2020   Subjective:  Patient ID: Stacey [name] Alert, female , DOB: 1941-03-21, age 61 y.o. years. , MRN: [id number],  ADDRESS: [address] [place] [address] PCP  Onita Rush, MD Providers : Treatment Team:  Attending Provider: Geronimo Amel, MD   Chief Complaint  Patient presents with   Follow-up    CT scan 8/23, has not increased Ofev , shortness of breath with exertion.    Rheumatoid arthritis with Joint s/ILD- Rituxan /steroids (duke uinviesty)  - progressive phenotype (CT 2017/2019 -> Oct 2020). Planned Ofev   start in Nov 2020 but did not start as of dec 2020. 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 Admitted 06/11/2019  - 06/16/2019. Rx with Remdesviri and Decadron .  Admitted August 2021 for respiratory infection not otherwise specified.  Following trip to [place] .  Was hypoxemic.  Small Hiatal hernia on CT Oct 2020  Rib fracture from fall Dec 2020: Musculoskeletal: There is nondisplaced fractures of the posterior left fifth, 6, 8, 9, and tenth left ribs. There is unchanged slight superior compression deformities of the T5, T6, T10 vertebral bodies with less than 25% loss in height.Associated pulmnary contusion +  -On Reclast  once a year through Dr. Delon Gosling    HPI Stacey [name] Alert 83 y.o. -returns for follow-up.  Last week by myself early June 2021.  Then after that in early August 2020 when she got admitted for respiratory infection not otherwise specified.  She was hypoxemic on room  air.  This followed a trip to [place] .  In the follow-up phase end of August 2020 when she had high-resolution CT chest that showed increased groundglass opacities but when compared to a CT from 8 months ago.  Also chronic ILD had worsened.  I personally visualized the image at this point in time she is improved from this admission she is not using oxygen  at all.  In fact walking desaturation test shows near baseline.  Nevertheless overall her symptom scores have declined over time ILD symptom score is listed  below.  She is really concerned about several issues.  In terms of her ILD she was inquiring about escalating to 150 mg twice daily.  Even at 100 mg twice daily she is significantly symptomatic in terms of nonrespiratory issues such as nausea and dizziness and fatigue.  I did caution her that these could all get worse.  Therefore she is just opted to be at 100 mg twice daily  -Recurrent admissions for respiratory issues [I did address that some of this was Covid and fall related and not necessarily her usual summer exacerbation].  Nevertheless, I asked about exposures at home.  She does not have a feather blanket or feather pillow or feather jacket.  She denies any mold or bird exposure or mildew exposure at home.  Does not do any gardening or wind instruments.  She is immunosuppressed and I did notice that she is not on Bactrim  for PCP prophylaxis.  In 2019 when we checked G6PD the lab could not process this lab.  She is interested in Bactrim  prophylaxis.  She is interested in okay getting G6PD rechecked   -She is dealing with significant amount of dizziness.  Neurology is sending her to neuro rehab.  It is believed it is multifactorial.  I reviewed her recent CT head angio report.  I personally visualized the image   -She is also worried about cough.  Currently cough is tolerable but she says prior to her exacerbations cough gets worse.  We did discuss with her that if she is aspirating which she denied.  We did discuss about the possibility of starting Bactrim  and monitoring her cough and she is fine with that.  I did review old records Lungs/Pleura: Biapical pleuroparenchymal scarring. Patchy and largely peripheral peribronchovascular ground-glass, increased from 07/03/2019. Underlying subpleural reticulation, traction bronchiectasis/bronchiolectasis and scattered honeycombing, similar to minimally increased from 07/03/2019. Calcified granulomas. No pleural fluid. Airway is unremarkable. No air  trapping.   Upper Abdomen: Visualized portions of the liver, adrenal glands, kidneys, spleen, pancreas, stomach and bowel are unremarkable with the exception of a small hiatal hernia. Cholecystectomy. Calcified upper abdominal lymph nodes.   Musculoskeletal: Degenerative changes in the spine. No worrisome lytic or sclerotic lesions.   IMPRESSION: 1. Increased patchy pulmonary parenchymal ground-glass may be due to an atypical/viral pneumonia, including due to COVID-19. 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 2. Aortic atherosclerosis (ICD10-I70.0). Coronary artery calcification.     Electronically Signed   By: Newell Eke M.D.   On: 03/04/2020 12:58    OV 08/01/2020  Subjective:  Patient ID: Stacey [name] Alert, female , DOB: 22-Aug-1940 , age 38 y.o. , MRN: [id number] , ADDRESS: [address] [place] [address] PCP Onita Rush, MD Patient Care Team: Onita Rush, MD as PCP - General (Internal Medicine) Lonni Slain, MD as PCP - Cardiology (Cardiology)  This Provider for this visit: Treatment Team:  Attending Provider: Geronimo Amel, MD   Rheumatoid arthritis with Joint s/ILD- Rituxan kathie (duke uinviesty)  - progressive phenotype (CT 2017/2019 -> Oct 2020). Planned Ofev  start in Nov 2020 but did not start as of dec 2020. 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 Admitted 06/11/2019  - 06/16/2019. Rx with Remdesviri and Decadron .  Admitted August 2021 for respiratory infection not otherwise specified.  Following trip to [place] .  Was hypoxemic.  Small Hiatal hernia on CT Oct 2020  Rib fracture from fall Dec 2020: Musculoskeletal: There is nondisplaced fractures of the posterior left fifth, 6, 8, 9, and tenth left ribs. There is unchanged slight superior compression deformities of the T5, T6, T10 vertebral bodies with less than 25% loss in height.Associated pulmnary contusion +  -On Reclast  once a year through Dr. Delon Gosling    08/01/2020 -   Chief Complaint  Patient presents with   Follow-up    Doing well     HPI Stacey [name] 83 y.o. -returns for follow-up.  She says since last visit she has lost weight.  In fact tracking her weight it appears she is definitely lost weight.  She has occasional intermittent nausea once every few weeks this is because she does not time her nintedanib  well with food.  She has a low appetite as well.  She tried to go up on the Internet but she is unable to.  She takes a low-dose 100 mg twice daily.  She is on Rituxan  and prednisone .  She is up-to-date with her COVID-vaccine.  Current shortness of breath standpoint she is stable.  Symptoms are stable.  CT Chest data  OV 10/22/2020  Subjective:  Patient ID: Stacey [name] Alert, female , DOB: [birth year] , age 8 y.o. , MRN: [id number] , ADDRESS: [address] [place] [address] PCP Onita Rush, MD Patient Care Team: Onita Rush, MD as PCP - General (Internal Medicine) Lonni Slain, MD as PCP - Cardiology (Cardiology)  This Provider for this visit: Treatment Team:  Attending Provider: Geronimo Amel, MD    10/22/2020 -   Chief Complaint  Patient presents with   Follow-up    Still having shortness of breath with activity, denies  increase in severity.     Rheumatoid arthritis with Joint s/ILD- Rituxan /steroids (duke uinviesty)  - progressive phenotype (CT 2017/2019 -> Oct 2020). Planned Ofev  start in Nov 2020 but did not start as of dec 2020. 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 Admitted 06/11/2019  - 06/16/2019. Rx with Remdesviri and Decadron .  - sp evushed x 2  Admitted August 2021 for respiratory infection not otherwise specified.  Following trip to [place] .  Was hypoxemic.  Small Hiatal hernia on CT Oct 2020  Rib fracture from fall Dec 2020: Musculoskeletal: There is nondisplaced fractures of the posterior left fifth, 6, 8, 9, and tenth left ribs. There is unchanged slight superior compression deformities of the T5, T6, T10 vertebral bodies with less than 25% loss in height.Associated pulmnary contusion +  -On Reclast  once a year through Dr. Delon Gosling  Normal ECHO Summer 2020   HPI ILEANA CHALUPA 83 y.o. -returns for follow-up.  At this point in time for her ILD she is taking nintedanib  100 mg twice daily.  Last liver function test was in December 2021.  She is now attending pulmonary rehabilitation.  She is not sure it is helping her.  She tells me that she does not desaturate at pulmonary rehabilitation so she not using her oxygen .  Review of the records indicate same but it appears that today her dyspnea is worse in terms of symptom score although she is denying that.  Walking desaturation  test in the office today with a forehead probe suggest that pulse ox is declined.  Her pulmonary function test also shows 5% FVC decline.  She was surprised by this.  In talking to her realize that for coexistent obstructive lung disease she is no longer taking Dulera .  She is willing to take inhaler which she will have to twist instead of having to pump with her fingers because she has rheumatoid arthritis.  She is no longer losing weight though.  She had questions about taking monoclonal antibody prophylaxis.  She is under the impression she needs to take it every 6 months with Rituxan  dosing.  I did explain to her that in the presence of Rituxan  the body is not able to respond actively to vaccine and that is why she is having prophylactic antibody.  She is now had 2 doses the last 1 being a few weeks ago first 1 was in January 2022.  I have written to the [facility] coordinator Morna Kendall 27 monoclonal antibody prophylaxis schedule.  Expressed to patient that because of monoclonal antibody prophylaxis she did not do the vaccine.  Last echocardiogram was in summer 2020.      OV 12/23/2020  Subjective:  Patient ID: Stacey [name] Alert, female , DOB: 1941-05-29 , age 3 y.o. , MRN: [id number] , ADDRESS: [address] [place] [address] PCP Onita Rush, MD Patient Care Team: Onita Rush, MD as PCP - General (Internal Medicine) Lonni Slain, MD as PCP - Cardiology (Cardiology)  This Provider for this visit: Treatment Team:  Attending Provider: Geronimo Amel, MD   12/23/2020 -   Chief Complaint  Patient presents with   Follow-up    PFT performed today. Pt states she has been doing okay since last visit and denies any complaints.     HPI MONAYE BLACKIE 83 y.o. -returns for follow-up.  There is concern for progression.  Last visit we have made sure she added Breo.  She says she feels better.  In fact symptom score is better.  However pulmonary function test shows  continued decline.  She is on the low-dose nintedanib .  She says if she does not take the nintedanib  exactly with food and even if  she takes it 15 to 20 minutes later she will have nausea.  Today she had nausea but otherwise if she is diligent she can maintain.  I informed her the pulmonary function test shows continued decline.  She is on Rituxan  prednisone  and low-dose nintedanib .  She initially said she will not be able to tolerate the higher dose nintedanib  but after realizing that her nausea is mild and her weight loss is stable she says she is willing to give the higher dose nintedanib  and attend.  I was able to get her some donor sample of nintedanib .  The lot number of this is [id number].  Expiration date is November 2023.  The serial number is [id number].  The GT IN number is [id number]       ECHO 11/3120  IMPRESSIONS     1. Left ventricular ejection fraction, by estimation, is 65 to 70%. The  left ventricle has normal function. The left ventricle has no regional  wall motion abnormalities. Left ventricular diastolic parameters are  consistent with Grade I diastolic  dysfunction (impaired relaxation). Elevated left ventricular end-diastolic  pressure. The E/e' is 37. The average left ventricular global longitudinal  strain is -18.4 %.   2. Right ventricular systolic function is normal. The right ventricular  size is normal. There is mildly elevated pulmonary artery systolic  pressure. The estimated right ventricular systolic pressure is 36.9 mmHg.   3. The mitral valve is abnormal. Mild mitral valve regurgitation.   4. The aortic valve is tricuspid. Aortic valve regurgitation is not  visualized. Mild aortic valve sclerosis is present, with no evidence of  aortic valve stenosis.   5. The inferior vena cava is normal in size with greater than 50%  respiratory variability, suggesting right atrial pressure of 3 mmHg.   Comparison(s): Changes from prior study are noted. 12/18/18  EF 60-65%. PA  pressure .    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  OV 03/10/2021  Subjective:  Patient ID: Stacey [name] Alert, female , DOB: 09-07-1940 , age 71 y.o. , MRN: [id number] , ADDRESS: [address] [place] [address] PCP Onita Rush, MD Patient Care Team: Onita Rush, MD as PCP - General (Internal Medicine) Lonni Slain, MD as PCP - Cardiology (Cardiology)  This Provider for this visit: Treatment Team:  Attending Provider: Geronimo Amel, MD    03/10/2021 -   Chief Complaint  Patient presents with   Follow-up    Pt states she has questions about ofev  she currently on it but having side effects.    Rheumatoid arthritis with Joint s/ILD- Rituxan /steroids (duke uinviesty)  - progressive phenotype (CT 2017/2019 -> Oct 2020). Planned Ofev  start in Nov 2020 but did not start as of dec 2020. 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 Admitted 06/11/2019  - 06/16/2019. Rx with Remdesviri and Decadron .  - sp evushed x 2  - 2nd covid by hx June 2022 - evusheld repeat Oct 2022  Admitted August  2021 for respiratory infection not otherwise specified.  Following trip to [place] .  Was hypoxemic.  Small Hiatal hernia on CT Oct 2020  Rib fracture from fall Dec 2020: Musculoskeletal: There is nondisplaced fractures of the posterior left fifth, 6, 8, 9, and tenth left ribs. There is unchanged slight superior compression deformities of the T5, T6, T10 vertebral bodies with less than 25% loss in height.Associated pulmnary contusion +  -On Reclast  once a year through Dr. 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  -returns for follow-up.  She presents with her husband Patsy.  Last visit she wanted to increase the nintedanib  to 150 mg twice daily but this is made her have more nausea and vomiting.  The diarrhea is very mild.  She then reduced her nintedanib  200 mg twice daily but still having side effects.  She is now started losing weight again.  She is really frustrated by this.  Her husband asked about pirfenidone.  Explained that the research with progressive pulmonary fibrosis is limited in the case of pirfenidone so nintedanib  is the first choice.  Did agree that if nintedanib  did not work we would try pirfenidone.  She wanted know what her options were.  We discussed about stopping nintedanib  and improving quality of life but face the risk of continued progression pulmonary fibrosis.  She was not that enthused about this choice.  We discussed about trying Zofran  with the nintedanib .  She seemed more receptive to this idea.  We decided that we will try with 100 mg twice daily at the lower dose and then see.  She believes asthma is under control.  Her pulmonary function test shown below seems to fluctuate.  Overall symptom severity stable.  She wanted a handicap placard signed today which I did.       OV 04/08/2021  Subjective:  Patient ID: Stacey [name] Alert, female , DOB: 11/24/1940 , age 78 y.o. , MRN: [id number] ,  ADDRESS: [address] [place] [address] PCP Onita Rush, MD Patient Care Team: Onita Rush, MD as PCP - General (Internal Medicine) Lonni Slain, MD as PCP - Cardiology (Cardiology)  This Provider for this visit: Treatment Team:  Attending Provider: Geronimo Amel, MD    04/08/2021 -   Chief Complaint  Patient presents with   Follow-up    Pt states she has been doing okay since last visit and denies any real complaints. States breathing has been doing okay and states the nausea went away.    Rheumatoid arthritis with Joint s/ILD- Rituxan /steroids ([facility])  - progressive phenotype (CT 2017/2019 -> Oct 2020). Planned Ofev  start in Nov 2020 but did not start as of dec 2020. LFT normal Jan 2021   - Rituxan  via [facility] -September 2021 + Medrol  4mg /2mg  QOD baseline many decades (on 8mg  per day since feb 2021 due to recurrent asthma flares)  - nintedanib  for her ILD she started it on July 31, 2019 - started 100mg  twice daily (started low dose du eto side effect concern) -> increased to 150mg  bid d June 2022 -> reduced to 100mg  bid aug 2022 due tow eight loss  -  ILD PRO registry study - since 08/16/19  Athma/obstructive lung disease phenotype on Dulera /Singulair   Post herpetic neuralgia - on elavil , gabapentin , effexor   Coronary Artery Calcification - 1 vessel -  S/p - covid-19 on 05/31/2019. Admitted 06/11/2019  - 06/16/2019. Rx with Remdesviri and Decadron .  - sp evushed x 2  Admitted August 2021 for respiratory infection not otherwise specified.  Following trip to [place] .  Was hypoxemic.  Small Hiatal hernia on CT Oct 2020  Rib fracture from fall Dec 2020: Musculoskeletal: There is nondisplaced fractures of the posterior left fifth, 6, 8, 9, and tenth left ribs. There is unchanged slight superior compression deformities of the T5, T6, T10 vertebral bodies with less than 25% loss in height.Associated pulmnary contusion +  -On Reclast   once a year through Dr. Delon Gosling  Normal ECHO Summer 2020 -> possible [facility] May 2021 RSVP 30s with GR1 ddx  HPI Stacey [name] 83 y.o. 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  Last seen 4 weeks ago.  At that time she was losing weight and having significant GI side effects with nintedanib  full dose.  Told her to reduce the nintedanib  dose to 100 mg twice daily and also take Zofran  as needed.  She is taking Zofran  as needed.  She is tolerating the nintedanib  low-dose much better.  Symptom scores are listed below.  Overall she feels better.  Weight loss is stabilized.  Infectious gained 1 pound of weight.  She feels that she will just take with nintedanib  100 mg twice daily using Zofran  as needed.  She does not want to change the schedule.  I fully agree with her and supported on that.  She is aware that the low-dose is less effective but this might be best balance between risk and benefit.  She did ask about getting a COVID by Valent mRNA booster 2 oh micron and BA.5.  According to history she has had COVID x2.  Most recently was in June 2022 which would be against the current viral strain.  In addition she is got monoclonal antibody prophylaxis euvsheld in past.  She does socially isolate herself to the extent possible and masks.  She is going to get the monoclonal antibody prophylaxis again in a few weeks when she gets her Rituxan .  We did discuss the fact that because she is on Rituxan  she is immunosuppressed and does not make antibodies and then the facet antibody transfer that she gets through [facility] is risk protective.  Additional risk protective feature is the fact that she had a current strain of COVID-19 and potentially has natural immunity.  Therefore it is her call to get the bivaet booster although she wanted to avoid it and I supported this in a shared decision making she could hold off till at least January 2023 when it  will be 6 months since her natural infection.     OV 07/18/2021  Subjective:  Patient ID: Stacey [name] Alert, female , DOB: 1940-07-22 , age 39 y.o. , MRN: [id number] , ADDRESS: [address] [place] [address] PCP Onita Rush, MD Patient Care Team: Onita Rush, MD as PCP - General (Internal Medicine) Lonni Slain, MD as PCP - Cardiology (Cardiology)  This Provider for this visit: Treatment Team:  Attending Provider: Geronimo Amel, MD    07/18/2021 -   Chief Complaint  Patient presents with   Follow-up    Pt states she has been doing okay since last visit. States  her breathing is about the same.    Rheumatoid arthritis with Joint s/ILD- Rituxan /steroids ([facility])  - progressive phenotype (CT 2017/2019 -> Oct 2020). Planned Ofev  start in Nov 2020 but did not start as of dec 2020. LFT normal Jan 2021   - Rituxan  via [facility] -September 2021 + Medrol  4mg /2mg  QOD baseline many decades (on 8mg  per day since feb 2021 due to recurrent asthma flares)  - nintedanib  for her ILD she started it on July 31, 2019 - started 100mg  twice daily (started low dose du eto side effect concern) -> increased to 150mg  bid d June 2022 -> reduced to 100mg  bid aug 2022 due tow eight loss  - ILD PRO registry study - since 08/16/19  Athma/obstructive lung disease phenotype on Dulera /Singulair   Post herpetic neuralgia - on elavil , gabapentin , effexor   Coronary Artery Calcification - 1 vessel -  S/p - covid-19 on 05/31/2019. Admitted 06/11/2019  - 06/16/2019. Rx with Remdesviri and Decadron .  - sp evushed x 2  Admitted August 2021 for respiratory infection not otherwise specified.  Following trip to [place] .  Was hypoxemic.  Small Hiatal hernia on CT Oct 2020  Rib fracture from fall Dec 2020: Musculoskeletal: There is nondisplaced fractures of the posterior left fifth, 6, 8, 9, and tenth left ribs. There is unchanged slight superior compression deformities of the  T5, T6, T10 vertebral bodies with less than 25% loss in height.Associated pulmnary contusion +  -On Reclast  once a year through Dr. Delon Gosling  Normal ECHO Summer 2020 -> possible [facility] May 2021 RSVP 30s with GR1 ddx   xxxx HPI VEATRICE ECKSTEIN 83 y.o. -presents for follow-up.  Last visit September 2022.  Since then she is doing stable.  With a lower dose of nintedanib  no more diarrhea.  Weight loss is stopped.  Barely any nausea.  She is not needing Zofran  for nausea control.  Her main concern right now is that she is having imbalance issues.  She is stopped working with the physical therapy.  She thinks it is 1 of proprioception but also long-term muscle loss and sarcopenia.  I have advised her to take this with her primary care/rheumatology.  Terms of asthma is stable on Breo and Singulair .  Last liver function test April 2022 in our chart.  Latest pulmonary function test shows stability    CT Chest data  No results found.            OV 03/06/2022  Subjective:  Patient ID: Stacey [name] Alert, female , DOB: 01/12/1941 , age 31 y.o. , MRN: [id number] , ADDRESS: [address] [place] [address] PCP Onita Rush, MD Patient Care Team: Onita Rush, MD as PCP - General (Internal Medicine) Lonni Slain, MD as PCP - Cardiology (Cardiology)  This Provider for this visit: Treatment Team:  Attending Provider: Geronimo Amel, MD    03/06/2022 -   Chief Complaint  Patient presents with   Follow-up    PFT performed today.  Pt states she has been doing okay since last visit. States her breathing has been doing okay but states she has been having some problems with balance.    HPI ALANE HANSSEN 83 y.o. -returns for follow-up.  Last seen in January 2023.  She says that from a breathing standpoint she is stable but she has had balance issues.  She has had 2 accidental falls.  The first 1 was in March 2023 while entering the house along the steps and she  landed in the bushes and had 25 stitches to her left lower extremity.  She also had collarbone fracture.  Then again a fall in the driveway 2 months ago but with just bruising.  She is now going to undergo physical therapy.  ENT evaluation did not show any ENT causes for the fall.  In terms of her dyspnea and walking desaturation test in the office she is stable but her pulmonary function test shows continued decline.  She is on nintedanib  at the lower dose because she is not able to tolerate the higher dose.  On this lower dose she still requiring Zofran  for control of nausea.  She takes Zofran  preemptively.  There are no other issues.  The last echo and CT scan was over a year ago.     OV 12/11/2022  Subjective:  Patient ID: Stacey [name] Alert, female , DOB: [birth year] , age 34 y.o. , MRN: [id number] , ADDRESS: [place] [address] [place] [address] PCP Caleen Dirks, MD Patient Care Team: Caleen Dirks, MD as PCP - General (Internal Medicine) Lonni Slain, MD as PCP - Cardiology (Cardiology)  This Provider for this visit: Treatment Team:  Attending Provider: Geronimo Amel, MD    12/11/2022 -   Chief Complaint  Patient presents with   Follow-up    F/up, in the hosp. 3 weeks ago, and wants to know why she was in the hosp.     HPI TAMKIA TEMPLES 83 y.o. -was last seen in August 2023.  After that her husband has passed away 2022/09/21 following motor vehicle accident when he was crossing the road.  .  Then she was admitted for 3 days 11/17/2022 through 11/20/2022 with nausea and vomiting.  She was also diagnosed with pneumonia.  The hospitalist contacted me.  Out of caution advised him to hold off giving any nintedanib  she did receive IV community-acquired pneumonia therapy.  Review of the labs indicate she did not suffer from acute kidney injury.  Her discharge sodium was slightly low at 134.  Blood cultures negative.She had CT abdomen and pelvis 11/11/2022 that  did not show any acute process in the abdomen. She was discharged to SNF.  She tells me that at baseline she was taking Zofran  once daily with some mild nintedanib  induced nausea but the onset of nausea for this admission was quite abrupt and severe.  She does not know the cause.  After discharge from the SNF 2 weeks ago and now for the last 10 days she is back taking nintedanib .  She is having some mild drug-induced nausea and she says she is able to combat this with taking Zofran  twice daily.  At this point in time she prefers to take Zofran  along with the nintedanib  and see what happens.   For a respiratory standpoint she is frustrated she not been able to get a pulmonary function test since August 2023.  Her ill health and also PFT machine not working have delayed this.  Subjectively she is feeling the same and clinically she feels stable from an ILD standpoint.  Therefore I suspect it is stable.   OV 03/02/2023  Subjective:  Patient ID: Stacey [name] Alert, female , DOB: 06/28/1941 , age 74 y.o. , MRN: [id number] , ADDRESS: [address] [place] [address] PCP Onita Rush, MD Patient Care Team: Onita Rush, MD as PCP - General (Internal Medicine) Lonni Slain, MD as PCP - Cardiology (Cardiology)  This Provider for this visit: Treatment  Team:  Attending Provider: Geronimo Amel, MD  2    03/02/2023 -   Chief Complaint  Patient presents with   Follow-up    F/up on PFT     HPI ARRION BURRUEL 83 y.o. -returns for follow-up.  She at this point is tolerating her nintedanib  at 100 mg twice daily but she is increased her Zofran .  This visit is to see her pulmonary function test.  In May 2024 she did have CT abdomen the lung images and that she did show progressive ILD compared to 2020 07/2020.  Her pulmonary function test during this time had declined but today the FVC is improved while the DLCO is worse.  I think overall this will be stable compared to a year ago  but directionally she does have progressive phenotype.  Her last liver function test was in May 2024.  She will have repeat liver function test today.  She also will participate in the ILD-Pro research registry protocol.  I did discuss with her about inhaled pirfenidone versus placebo in a clinical trial development protocol.  She is interested in this and I gave her a copy of the consent.     OV 08/26/2023  Subjective:  Patient ID: Stacey [name] Alert, female , DOB: [birth year] , age 86 y.o. , MRN: [id number] , ADDRESS: [place] [address] [place] [address] PCP Caleen Dirks, MD Patient Care Team: Caleen Dirks, MD as PCP - General (Internal Medicine) Lonni Slain, MD as PCP - Cardiology (Cardiology)  This Provider for this visit: Treatment Team:  Attending Provider: Geronimo Amel, MD    Nintedanib /Ofev  requires intensive drug monitoring due to high concerns for Adverse effects of , including  Drug Induced Liver Injury, significant GI side effects that include but not limited to Diarrhea, Nausea, Vomiting,  and other system side effects that include Fatigue,  weight loss. Cardiac side effects are a black box warning as well. These will be monitored with  blood work such as LFT initially once a month for 6 months and then quarterly   08/26/2023 -   Chief Complaint  Patient presents with   Follow-up    ILD F/U      HPI SAMYRA LIMB 83 y.o. -returns for follow-up.  She is now relocated to a house near the office.  This is because she became a widow.  However shortly after moving to the house she picked up the influenza.  This was early February 2025 this year this month.  She was hospitalized in mild respiratory failure and 1 day of acute kidney injury and some dehydration.  Since then she has been admitted to skilled nursing facility rehabilitation.  She needs 1 more week of this before discharge.  However she continues nintedanib  for her ILD and it is  stable.  She also maintains a every 75-month Rituxan .  The last dose was in August 2024.  The next dose is due 08/30/2023 at [facility].  She says now being a widow and living alone and also being deconditioned after influenza she is wondering if she can get her Rituxan  locally.  I did indicate to her that our office has an adjacent infusion center run by [facility].  She was delighted to hear this because there is only 1 mile from her house.  Therefore I have corresponded with Dr. Delon Jama Gosling at [facility] and also pharmacy team to see if Rituxan  is still indicated and if so the dosage.  Waiting to hear on that.  Given all this she wants to hold off on in participating on any recent study.  On exam she did have crackles and squeaks but she does not feel she is in an exacerbation.  She is due for an ILD-pro registry visit today.   Last CT scan of the chest was in 2021 Last pulmonary function test was in August 2024   Of note she tells me that for the last few months she has had pedal edema and this been no workup.  Hospital lab review does not show she has had a BNP or echo.  Last echo was in 2022.   For this visit external records from the hospital were reviewed.  Also communication was established with other providers.  She is on    OV 09/09/2023  Subjective:  Patient ID: Stacey [name] Alert, female , DOB: 25-Dec-1940 , age 83 y.o. , MRN: [id number] , ADDRESS: [place] [address] [place] [address] PCP Caleen Dirks, MD Patient Care Team: Caleen Dirks, MD as PCP - General (Internal Medicine) Lonni Slain, MD as PCP - Cardiology (Cardiology)  This Provider for this visit: Treatment Team:  Attending Provider: Geronimo Amel, MD    09/09/2023 -     HPI Stacey [name] Alert 83 y.o. -acute visit posthospitalization.  She just saw me 08/26/2023.  Then 4 days later on 08/30/2023 she ended up in the ER.  Review of the records show that she  had vomiting the previous day and then the next morning she was no acute hypoxic respiratory failure.  She got treated with antibiotics and steroids.  She did have an echocardiogram that did not show pulmonary hypertension.  She also had a BNP that was normal.  She had significant pedal edema.  Her UA was abnormal.  A the cellulitis of urinary tract infection was because of her vomiting but she is on nintedanib .  She was treated with antibiotics.  She is now even more physically deconditioned but managing.  She is off oxygen  at rest.  She did get some steroids.  She is discharged and she is presenting with one of her friends and a caretaker.   PFT    OV 10/01/2023  Subjective:  Patient ID: Stacey [name] Alert, female , DOB: 15-Dec-1940 , age 62 y.o. , MRN: [id number] , ADDRESS: [place] [address] [place] [address] PCP Caleen Dirks, MD Patient Care Team: Caleen Dirks, MD as PCP - General (Internal Medicine) Lonni Slain, MD as PCP - Cardiology (Cardiology) Type of visit: Video Virtual Visit Identification of patient Stacey [name] with Nov 11, 1940 and MRN [id number] - 2 person identifier Risks: Risks, benefits, limitations of telephone visit explained. Patient understood and verbalized agreement to proceed Anyone else on call: her friend Patient location: her rejab jp,e This provider location: [address], [address]; [place]; [place] [address].  Pulmonary Office. [phone]      This Provider for this visit: Treatment Team:  Attending Provider: Geronimo Amel, MD   10/01/2023 -    HPI Stacey [name] Alert 83 y.o. -just saw her 3 weeks ago.  She still has a follow-up from hospitalization that is pending in 1 week.  When I saw her last time she was extremely fatigued after the hospitalization that was for vomiting and also cellulitis.  I advised her to stop her nintedanib  and which she did for 2 weeks and then restarted currently taking 100 mg  low-dose protocol twice  daily.  She is tolerating it fine.  She said stopping and restarting it did not make any difference.  But the main complaint she has is that she is feeling poorly.  She feels fatigued [although on the camera I could not tell a difference].  She saw Dr. Onita her primary care physician 4 days ago this week and was found to be appropriate hypoxemic 83% on room air.  Since then she has been on 3 L nasal cannula continuous.  She is on antibiotics and prednisone .  Apparently chest x-ray showed ILD along with pneumonia.  She is also having insomnia.  It since she is now 3 months past her Rituxan  which she wants to get it at our infusion center through [facility].  She feels that her ILD is worse or her symptoms are worse because of the lack of Rituxan  and she really wants to go back on Rituxan .  Nevertheless there is no worsening in arthritis.  She did have labs at primary care but I do not have access to this.   OV 10/07/2023  Subjective:  Patient ID: Stacey [name] Alert, female , DOB: June 14, 1941 , age 72 y.o. , MRN: [id number] , ADDRESS: [place] [address] [place] [address] PCP Caleen Dirks, MD Patient Care Team: Caleen Dirks, MD as PCP - General (Internal Medicine) Lonni Slain, MD as PCP - Cardiology (Cardiology)  This Provider for this visit: Treatment Team:  Attending Provider: Geronimo Amel, MD    10/07/2023 -   Chief Complaint  Patient presents with   Hospitalization Follow-up    PCP at [facility] told her last week to increase her o2 from 2 to 3 lpm. She feels like her breathing is fine.  Had fall yesterday. She has had some feet swelling. Not taking lasix .     HPI Stacey [name] 83 y.o. -returns for posthospital follow-up.  I saw her in February face-to-face after post influenza follow-up.  Then she got hospitalized for vomiting.  I saw her on video last week and she was quite deconditioned and she was on oxygen .  Today she comes  with her friend/caregiver.  She says she is much improved she is not having any nausea any vomiting.  She is tolerating nintedanib  well with Zofran .  She is feeling stronger.  She is wondering if she needs to use the oxygen .  She exercised sitting and standing 10 times and did not desaturate below 88%.  She is back to baseline.  I advised that she could just use oxygen  with heavy exertion or at night she does not needed at rest.  She still wants to do her Rituxan .  Her pharmacy team is working with her [facility] rheumatologist to get it done at the infusion center here in [place] through [facility] rheumatology prescription.  She has not heard anything.  I did indicate to her that there is no contraindication from my point as of today.  She is finished antibiotics and prednisone .  On exam there is no wheezing.  Of note she fell down yesterday and has a bruise around her right mandible.  Apparently the nursing home staff thought it was because of high oxygen  use.  I indicated to her that the only seen in COPD patients it does not apply to her.  She has upcoming visit with me with pulmonary function test May 2 I advised her to keep that.     OV 11/12/2023  Subjective:  Patient ID: Stacey [name] Alert, female , DOB:  01-15-1941 , age 22 y.o. , MRN: [id number] , ADDRESS: [address] [place] [address] PCP Caleen Dirks, MD Patient Care Team: Caleen Dirks, MD as PCP - General (Internal Medicine) Lonni Slain, MD as PCP - Cardiology (Cardiology)  This Provider for this visit: Treatment Team:  Attending Provider: Geronimo Amel, MD    11/12/2023 -   Chief Complaint  Patient presents with   Follow-up    PFT repeated today. Breathing is about the same.      HPI MARIESHA VENTURELLA 83 y.o. -returns for follow-up.  She presents with her caregiver/friend.  Since her last visit she has had 1-2 more falls but no injuries.  Overall physical frailty persist physical deconditioning  persist but she has improved.  She feels stable from a respiratory standpoint although she later admitted that she has got some more slight increased wheezing but is no sputum production.  She had a high-resolution CT chest November 09, 2023 and this new groundglass opacities but overall she feels improved other than the slight increase in wheezing.  She is frustrated she has not gotten her Rituxan .  She says nobody in [facility] is reaching back to her.  Pharmacy team is also not been able to get hold of the people at [facility] from what she tells me.  She is finding it hard to go to do.  I did recommend that she stop going to [facility] and that she take care locally.  In the past she was going to Dr. Mai but then she switched over to [facility] because of its [facility] nature.  I did indicate to her that for where she is we should be able to take care of her health issues in [place] and made a referral back to Dr. Mai.  Initially I told her that I would do the Rituxan  1 dose till Dr. Mai sees her but later upon noticing the CT inflammatory findings I decided to hold off on the Rituxan  [she is frustrated's been 1 year since her last Rituxan ] and let Dr. Mai decide.  I did indicate to her that from a joint perspective [she does not have worsening rheumatoid arthritis] that we can hold off on Rituxan  but her pulmonary function test has declined.  She is continuing her nintedanib  low-dose protocol and Zofran .  She is also continuing her baseline Medrol  4 mg and 2 mg alternating.  Her baseline edema has improved.   HRCT from 11/09/2023 showed stable ILD from CTA chest 08/30/2023 and CT C/A/P w/ contrast on 06/21/2023 but progressed since CT chest w/ contrast on 07/03/2019 and there are new consolidations in RML and bilateral lower lobes. Also noted enlarged pulmonary trunk.   PFTs show worsening FVC, FEV1, and DLCO.    Friend - gave hindpendent history and confirmed falls Extrenal record reviewed MEssage  send to Pharmacy team      OV 01/03/2024  Subjective:  Patient ID: Stacey [name] Alert, female , DOB: 1940/10/26 , age 86 y.o. , MRN: [id number] , ADDRESS: [address] [place] [place] [id number] PCP Caleen Dirks, MD Patient Care Team: Caleen Dirks, MD as PCP - General (Internal Medicine) Lonni Slain, MD as PCP - Cardiology (Cardiology)  This Provider for this visit: Treatment Team:  Attending Provider: Geronimo Amel, MD    01/03/2024 -   Chief Complaint  Patient presents with   Follow-up    Breathing is stable today. No new co's.      HPI Briar Sword  Adan 83 y.o. -returns for continued intensive monitoring given her multiple medical problems and complexity and frailty especially posthospital.  Since her last visit she feels stronger.  She feels stable.  At the last visit early May 2025 I did treat her for respiratory exacerbation with antibiotic and prednisone .  She states that subsequently primary care also treated her with antibiotic and prednisone  for 10 days because she has shortness of breath and cough this was approximately a month ago.  Currently more stable.  Symptom scores are below.     In terms of asthma: Will check blood eosinophils and IgE.  These are high despite Medrol  and given frequent exacerbations will consider biologic   In terms of ILD and RA:  She continues on nintedanib .  She continues daily Medrol .  She still has not started her Rituxan .  Last time we took a shared decision making for her to see Dr. Mai given the delays at [facility] health medical group rheumatology for new appointment.  However she decided against going to Dr. Willadean office.  This is because of prior experience that.  She has an upcoming appointment January 21, 2024 at [facility] with Dr. Lenon.  I did tell her to keep that and to discuss Rituxan .  I did indicate to her that in speaking with our pharmacist we will review the PET scan at infusion center but the  prescription should come from rheumatology or at least the advice.  She will clarify this at her [facility] visit.  Nevertheless she wants a local rheumatologist given the fact she is frail, advancing age and now a widow.  I will make a referral to [facility] health medical group rheumatology.  Did indicate to her that the wait time could be few to several months but to accept this referral.  Gave her consent to inhaled treprostinil versus placebo 305 and progressive phenotype ILD study.  She is going to review this.  She is interested  In terms of posthospital pneumonia: She had a chest x-ray that looks improved to me but official report is pending  She still has issues with edema: On exam this is much improved but we will check BNP   In terms of falls: She has not fallen since last visit  : Posthospital anemia hemoglobin 9 g% in February 2025.  No further follow-up labs present.  Therefore we will check it.  OV 02/11/2024  Subjective:  Patient ID: Stacey [name] Lyle, female , DOB: 01/31/1941 , age 24 y.o. , MRN: [id number] , ADDRESS: [id number] [place] [address] PCP Onita Rush, MD Patient Care Team: Onita Rush, MD as PCP - General (Internal Medicine) Lonni Slain, MD as PCP - Cardiology (Cardiology)  This Provider for this visit: Treatment Team:  Attending Provider: Geronimo Amel, MD    Rheumatoid arthritis with Joint s/ILD- Rituxan kathie festus lusher)  - progressive phenotype (CT 2017/2019 -> Oct 2020). Planned Ofev  start in Nov 2020 but did not start as of dec 2020. LFT normal Jan 2021   - Rituxan  via [facility] -September 2021 + Medrol  4mg /2mg  QOD baseline many decades (on 8mg  per day since feb 2021 due to recurrent asthma flares)   -> ON HOLD since summer/spring 2024   - NOt recommended for joints following [facility] visit July 2025 (due to good RA joint control)   - nintedanib  for her ILD she started it on July 31, 2019 -  started 100mg  twice daily (started low dose du eto  side effect concern) -> increased to 150mg  bid d June 2022 -> reduced to 100mg  bid aug 2022 due tow eight loss  - ILD PRO registry study - since 08/16/19  Athma/obstructive lung disease phenotype on Dulera /Singulair   - Normal allergy  panel and igE but his EOS  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 Admitted 06/11/2019  - 06/16/2019. Rx with Remdesviri and Decadron .   - sp evushed x 2  - Admitted August 2021 for respiratory infection not otherwise specified.  Following trip to [place] .  Was hypoxemic.  - Status post  influenza hospitalization for mild respiratory failure February 2025.  -   Small Hiatal hernia on CT Oct 2020  Rib fracture from fall Dec 2020: Musculoskeletal: There is nondisplaced fractures of the posterior left fifth, 6, 8, 9, and tenth left ribs. There is unchanged slight superior compression deformities of the T5, T6, T10 vertebral bodies with less than 25% loss in height.Associated pulmnary contusion +  -On Reclast  once a year through Dr. Delon Gosling  ECHO FEb 2-025 - Gr2 DDx (in 2022 - Gr1 ddx)  -   Chronic sterile Medrol  4 mg with alternating 2 mg.   Nintedanib /Ofev  requires intensive drug monitoring due to high concerns for Adverse effects of , including  Drug Induced Liver Injury, significant GI side effects that include but not limited to Diarrhea, Nausea, Vomiting,  and other system side effects that include Fatigue,  weight loss. Cardiac side effects are a black box warning as well. These will be monitored with  blood work such as LFT initially once a month for 6 months and then quarterly   02/11/2024 -   Chief Complaint  Patient presents with   Follow-up    ILD Pt states she is doing good     HPI YAZLIN EKBLAD 83 y.o. - returns for follow-up.  Since the last visit Interim Health status: No  new complaints No new medical problems. No new surgeries. No ER visits. No Urgent care visits. No changes to medications.  She did visit Dr. Lenon at [facility] in July 2025 notes reviewed.  She is aligned with the notes.  They took a shared decision making not to restart Rituxan  because of excellent or a joint control.  Today she is here to review her respiratory status  - ILD: This is stable.  Tolerating nintedanib  well.  Nintedanib  was low-dose.  Symptoms are stable.  Symptom score is stable.  She did pulmonary function test and shows an improvement compared to May 2025 but overall course is 1 of progressive phenotype even compared to 1 year ago.  I did share this with her.  She wants to remain on nintedanib .  She does not want to add research protocol with the inhaled treprostinil versus placebo because of inconvenience.  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  She is going to reflect on this.  We took a shared decision making to see her in 3 months with another PFT and if shows continued progression then by the time of December 2025 visit with rheumatology will take a decision about adding either Rituxan  or tocilizumab.  I personally favor the latter because of convenience.  - Asthma with recurrent exacerbations: Repeated blood eosinophil and it is high.  She will  continue Breo we talked about Fasenra and biologic therapy for asthma.  She is interested.  I think this is a good idea to improve cough and diminish exacerbations.  - Grade 2 diastolic dysfunction and edema: The edema is further improved.  BNP test again in June 2025 was normal.  There was no evidence of pulmonary hypertension on the echo.  Will continue to monitor this. .      SYMPTOM SCALE - ILD 03/27/2019  05/24/2019 (2-3 weeks    .mrmonpre cpovid)  08/03/2019 Post  covid Post fall rib # 12/18/2019  04/10/2020 142# resp virus admit aug 2021 nos 08/01/2020 134#  10/22/2020 136# Now doing rehab 12/23/2020 136# 03/10/2021 131# Ofev  150mg  bid 04/08/2021 132# Ofev  100mg  bid 07/18/2021 133# ofev  100 x 2 03/06/2022 132#, ofev  100x2 12/11/2022 128# 03/02/2023  09/09/2023 POST ADMIT 11/12/2023  01/03/2024 125# 02/11/2024 124#  O2 use ra       ra ra ra ra ra ra ra ra ra  O2 at night ?  Shortness of Breath 0 -> 5 scale with 5 being worst (score 6 If unable to do)                   At rest 0 2 3 2 3 0 3 0 3 1  2 1  4 3 1   0  Simple tasks - showers, clothes change, eating, shaving 0 3 2 3 4 1 3 2 3 1  3 3  3 3 2  2   Household (dishes, doing bed, laundry) 2 4 5 3 4 3 3 3 4 3 3 4  4 5 2  3   Shopping 2 3 6 4 4 4 4 3 4 4 4 4  4 5 2  3   Walking level at own pace 2 3 4 4 3 2 4 2 3 3 3 4  4 5 3  3   Walking up Stairs 3 3 5 4 4 4 4 3 4 4 4 5  4 5 4  4   Total (40 - 48) Dyspnea Score 9 17 25 20 22 14 21 13 21 16 19 21  23 26 14  15   How bad is your cough? 2 2 3 1 2  0 0 0 1 0 0 0  3 1 2  1   How bad is your fatigue 3 3.5 5 3.5 3 4 3 2 3 3 4 4  3 5 3  2   nause    0 1 1 0 1 2.5 1 1 1   0 0 1  1  vomit    0 1 1 0 1 2.4 1 1 1   0 0 1  0  diarrhea    0 1 0 0 0 1 0 0 0  0 3 0  0  anxiety    0 2 0 1 0 1 0 0 1  0 0 0  00  depression    0 1 0 0 0 0 0 0 0  0 0 0  0  0        SIT STAND TEST - goal 15 times   01/03/2024    O2 used ra   PRobe - finter or forehead forehead   Number sit and stand completed - goal 15 15   Time taken to complete 1 min and 1 second using WALKER   Resting Pulse Ox/HR/Dyspnea  95% and 74/min and dyspnea of 1/10    Peak measures 96 % and 84/min and  dyspnea of 8/10   Final Pulse Ox/HR 93% and 84/min and dyspnea of 5/10   Desaturated </= 88% no   Desaturated <= 3% points no   Got Tachycardic >/= 90/min no   Miscellaneous comments Got dyspenic. Needed waer       PFT     Latest Ref Rng & Units 02/11/2024    8:20 AM 11/12/2023    8:20 AM 03/01/2023    11:45 AM 03/06/2022    3:01 PM 07/02/2021    3:07 PM 03/07/2021   11:05 AM 12/23/2020    1:03 PM  PFT Results  FVC-Pre L 1.57  P 1.47  1.81  1.62  1.69  1.78  1.66   FVC-Predicted Pre % 61  P 56  69  61  62  66  60   Pre FEV1/FVC % % 74  P 67  76  74  73  72  71   FEV1-Pre L 1.16  P 0.99  1.39  1.20  1.23  1.28  1.17   FEV1-Predicted Pre % 61  P 51  71  61  61  64  57   DLCO uncorrected ml/min/mmHg 9.17  P 8.70  11.53  12.57  15.35  12.94  14.98   DLCO UNC% % 47  P 45  59  65  79  66  77   DLCO corrected ml/min/mmHg 9.87  P  11.53  12.57  15.35  12.94  14.98   DLCO COR %Predicted % 51  P  59  65  79  66  77   DLVA Predicted % 71  P 67  78  84  100  84  86     P Preliminary result     Latest Reference Range & Units 05/05/07 13:58 12/18/09 00:20 01/22/18 23:57 01/24/18 08:26 01/27/18 04:45 03/21/18 11:19 03/23/18 07:31 03/24/18 05:08 07/18/18 14:01 12/17/18 23:54 05/06/19 17:15 06/11/19 21:54 06/13/19 02:07 06/14/19 02:00 06/15/19 01:25 07/03/19 22:31 02/09/20 18:50 02/11/20 07:50 06/18/20 07:20 06/21/20 16:51 10/08/21 19:58 06/21/23 10:47 08/15/23 21:15 08/26/23 14:42 08/30/23 11:22 01/03/24 09:11  Eosinophils Absolute 0.0 - 0.7 K/uL 0.3 0.3 0.0 0.0 0.1 1.1 (H) 2.3 (H) 0.7 0.3 0.8 (H) 0.3 0.5 0.1 0.0 0.0 0.3 0.1 0.0 0.5 0.3 0.3 0.3 0.1 0.5 0.5 0.4  (H): Data is abnormally high  Latest Reference Range & Units 01/03/24 09:11  Pro B Natriuretic peptide (BNP) 0.0 - 100.0 pg/mL 84.0    LAB RESULTS last 96 hours No results found.   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   reports that she has never smoked. She has never used smokeless tobacco.  Past Surgical History:  Procedure Laterality Date   CHOLECYSTECTOMY  2011   IR ANGIO INTRA EXTRACRAN SEL COM CAROTID INNOMINATE BILAT MOD SED  06/18/2020   IR ANGIO VERTEBRAL SEL SUBCLAVIAN INNOMINATE UNI R MOD SED  06/18/2020   IR ANGIO VERTEBRAL SEL VERTEBRAL UNI L MOD SED  06/18/2020   WISDOM TOOTH EXTRACTION      Allergies  Allergen Reactions   Penicillins Other (See Comments)  Unknown reaction  Did it involve swelling of the face/tongue/throat, SOB, or low BP? Unknown Did it involve sudden or severe rash/hives, skin peeling, or any reaction on the inside of your mouth or nose? Unknown Did you need to seek medical attention at a hospital or doctor's office? No When did it last happen?     childhood  If all above answers are NO, may proceed with cephalosporin use.     Remicade [Infliximab] Other (See Comments)    Unknown reaction    Immunization History  Administered Date(s) Administered   Fluad Quad(high Dose 65+) 04/23/2020, 03/23/2022   Influenza Split 04/12/2013, 04/12/2014, 03/14/2015, 04/23/2016   Influenza, High Dose Seasonal PF 04/12/2017, 03/24/2018, 04/13/2019, 03/27/2021   Influenza, Quadrivalent, Recombinant, Inj, Pf 03/27/2021, 04/16/2023   Influenza-Unspecified 04/03/2013, 04/22/2017   Moderna Sars-Covid-2 Vaccination 08/25/2019, 09/22/2019, 03/07/2020   PNEUMOCOCCAL CONJUGATE-20 10/01/2022   Pfizer Covid-19 Vaccine Bivalent Booster 62yrs & up 05/16/2021   Pneumococcal Conjugate-13 04/03/2013, 09/28/2014   Pneumococcal Polysaccharide-23 07/13/2012, 07/24/2013   Respiratory Syncytial Virus Vaccine,Recomb Aduvanted(Arexvy) 04/12/2022   Td 01/11/2018   Tdap 12/10/2017    Family History  Problem Relation Age of Onset   Asthma Mother    Anemia Mother    Polymyalgia rheumatica Mother    COPD Father    Pulmonary  fibrosis Father    Breast cancer Maternal Grandmother 61     Current Outpatient Medications:    acetaminophen  (TYLENOL ) 500 MG tablet, Take 1,000 mg by mouth every 6 (six) hours as needed for moderate pain (pain score 4-6)., Disp: , Rfl:    amitriptyline  (ELAVIL ) 25 MG tablet, Take 1 tablet (25 mg total) by mouth at bedtime., Disp: 30 tablet, Rfl: 1   BREO ELLIPTA  100-25 MCG/ACT AEPB, Inhale 1 puff into the lungs daily., Disp: , Rfl:    cholecalciferol  (VITAMIN D3) 25 MCG (1000 UNIT) tablet, Take 1,000 Units by mouth every evening., Disp: , Rfl:    gabapentin  (NEURONTIN ) 300 MG capsule, Take 600 mg by mouth at bedtime., Disp: , Rfl:    losartan  (COZAAR ) 25 MG tablet, Take 12.5 mg by mouth daily., Disp: , Rfl:    methylPREDNISolone  (MEDROL ) 2 MG tablet, Take 2 mg by mouth daily. 2 mg alternating with 4 mg daily, Disp: , Rfl:    Nintedanib  (OFEV ) 100 MG CAPS, Take 1 capsule (100 mg total) by mouth 2 (two) times daily., Disp: 60 capsule, Rfl: 5   ondansetron  (ZOFRAN ) 4 MG tablet, TAKE ONE TABLET BY MOUTH EVERY 8 HOURS AS NEEDED FOR NAUSEA AND VOMITING, Disp: 30 tablet, Rfl: 5   pantoprazole  (PROTONIX ) 40 MG tablet, Take 40 mg by mouth daily., Disp: , Rfl:    polyethylene glycol (MIRALAX  / GLYCOLAX ) 17 g packet, Take 17 g by mouth daily as needed for mild constipation., Disp: 14 each, Rfl: 0   pravastatin  (PRAVACHOL ) 20 MG tablet, Take 1 tablet (20 mg total) by mouth daily., Disp: 90 tablet, Rfl: 1   predniSONE  (DELTASONE ) 10 MG tablet, Take prednisone  40 mg daily x 2 days, then 20mg  daily x 2 days, then 10mg  daily x 2 days, then 5mg  daily x 2 days and then go to baseline medrol  dosing. While on prednisone  hold baseline medrol , Disp: 15 tablet, Rfl: 0   venlafaxine  XR (EFFEXOR -XR) 75 MG 24 hr capsule, Take 75 mg by mouth daily., Disp: , Rfl:       Objective:   Vitals:   02/11/24 0854  BP: 120/64  Pulse: 74  SpO2: 94%  Weight: 124  lb 6.4 oz (56.4 kg)  Height: 5' 5 (1.651 m)    Estimated  body mass index is 20.7 kg/m as calculated from the following:   Height as of this encounter: 5' 5 (1.651 m).   Weight as of this encounter: 124 lb 6.4 oz (56.4 kg).  @WEIGHTCHANGE @  [employer]   02/11/24 0854  Weight: 124 lb 6.4 oz (56.4 kg)     Physical Exam   General: No distress. len O2 at rest: no Cane present: no Sitting in wheel chair: n Frail: yes wt alker Obese: no Neuro: Alert and Oriented x 3. GCS 15. Speech normal Psych: Pleasant Resp:  Barrel Chest - no.  Wheeze - basal, Crackles - yes with basal wheeze, No overt respiratory distress CVS: Normal heart sounds. Murmurs - no Ext: Stigmata of Connective Tissue Disease - no HEENT: Normal upper airway. PEERL +. No post nasal drip        Assessment/     Assessment & Plan Interstitial lung disease due to connective tissue disease (HCC)  Encounter for therapeutic drug monitoring  Chronic respiratory failure with hypoxia (HCC)  Eosinophilic asthma  Moderate persistent asthma without complication    PLAN Patient Instructions  ILD due to RA Encounter for therapeutic monitoring   - Lung function is improved in August 2025 compared to May 2025 but overall compared to 2022, 2023 and summer 2024 it is progressive -Glad doing well tolerating the nintedanib  well with Zofran  and not having any nausea vomiting   Plan -  Continue nintedanib  low-dose protocol  - Normal liver function test June 2025; will repeat in 3 months at follow-up -Continue oxygen  at night and with heavy exertion keeping pulse ox of 88%  = You do not need oxygen  at rest - Respect not wanting to do TETON 305 inhaled treprostinil versus placebo study due to inconvenience -do spiro and dlco in 12 weeks  - If there is continued progression then we have to discuss options such as immunomodulatory add-on [see rheumatology section]    History of rheumatoid arthritis  -  - noted nearly  > 1 year since last Riuxan  - Glad you saw Dr.  Lenon rheumatology on  01/21/2024 took a shared decision making not to reinitiate Rituxan    Plan - continue daily medrol  long term - Keep [facility] rheumatology in late part of 2025 to get local rheum esp with advancing age and frailty  - At this point in time we can take a decision about reinitiating Rituxan  or initiating tocilizumab at least based on ILD status     PEDAL EDEMA  ECHO - no pulmonary hypertensio n Feb 2025 but has GRad 2 Diast Dysfunction BNP - normal FEb 2024 and June 2025  -Significantly improved on this visit 11/12/2023 and 01/03/2024 and 02/11/2024.   Plan According to primary care physician  Wheezing Hz of asthma - normal allergy  test in 2019 Frequent exacerbation  - review labs show elevated eosinophils - always with rhonchii - noted mulitiple exacerbaion in 2025  Plan  -  -Continue Breo - ADD [employer]  Falls Frequent Frailty  -The fall on 10/06/2023 is unrelated to her lung disease or oxygen  use - On 11/12/2023 1-2 more falls since visit end march 2025 - No falss since last visit in May 2025.  - great news  Plan - use walker  - continue PT -per PCP    Followup -12 weeks- 30 min visit with Dr Barnett  -  Symptom score and exercise  hypoxemia test  - PFT at followup-      FOLLOWUP    Return in about 3 months (around 05/13/2024) for 30 min visit, after Spiro and DLCO, ILD, with Dr Geronimo.    SIGNATURE    Dr. Dorethia Geronimo, M.D., F.C.C.P,  Pulmonary and Critical Care Medicine Staff Physician, [facility] Director - Interstitial Lung Disease  Program  Pulmonary Fibrosis [facility] at [facility] [place], [place], [address]  Pager: [phone], If no answer or between  15:00h - 7:00h: call [phone] Telephone: [phone]  9:37 AM 02/11/2024

## 2024-02-24 NOTE — Telephone Encounter (Signed)
 SABRA

## 2024-03-01 ENCOUNTER — Encounter

## 2024-03-09 ENCOUNTER — Other Ambulatory Visit (HOSPITAL_COMMUNITY): Payer: Self-pay

## 2024-03-09 ENCOUNTER — Telehealth: Payer: Self-pay

## 2024-03-09 ENCOUNTER — Telehealth (HOSPITAL_BASED_OUTPATIENT_CLINIC_OR_DEPARTMENT_OTHER): Payer: Self-pay

## 2024-03-09 DIAGNOSIS — Z8709 Personal history of other diseases of the respiratory system: Secondary | ICD-10-CM

## 2024-03-09 NOTE — Telephone Encounter (Signed)
 Copied from CRM #[id number]. Topic: Clinical - Prescription Issue >> Mar 09, 2024 12:26 PM Russell PARAS wrote: Reason for CRM:   Betsey, with [facility], contacted clinic to advise the PA for [facility] has been APPROVED, with approval dates of 03/09/2024-03/09/2025. She will fax details of approval over to clinic as well today.   CB#  [id number], opt 2

## 2024-03-09 NOTE — Telephone Encounter (Signed)
 Received notification from [facility] regarding a prior authorization for [facility]. Authorization has been APPROVED from 03/09/24 to 03/09/25. Approval letter sent to scan center.  Per test claim, copay for 28 days supply is $0  Authorization # [id number] Phone # [phone]

## 2024-03-09 NOTE — Telephone Encounter (Signed)
 Submitted a Prior Authorization request to [facility] for [facility] via CoverMyMeds. Will update once we receive a response.  Key: [id number]

## 2024-03-14 ENCOUNTER — Other Ambulatory Visit: Payer: Self-pay | Admitting: Internal Medicine

## 2024-03-14 ENCOUNTER — Other Ambulatory Visit: Payer: Self-pay

## 2024-03-14 MED ORDER — FASENRA PEN 30 MG/ML ~~LOC~~ SOAJ
SUBCUTANEOUS | 0 refills | Status: DC
  Filled 2024-03-21: qty 1, 28d supply, fill #0

## 2024-03-14 MED ORDER — FASENRA PEN 30 MG/ML ~~LOC~~ SOAJ
SUBCUTANEOUS | 0 refills | Status: DC
  Filled 2024-03-14: qty 1, fill #0

## 2024-03-14 NOTE — Telephone Encounter (Signed)
 Scheduled for new start Fasenra  9/15 with pharmacy team. She is aware to look out for call from [place] or [place] for onboarding.   Rx can be filled at [facility].  Aleck Puls, PharmD, BCPS Clinical Pharmacist  [facility]

## 2024-03-14 NOTE — Addendum Note (Signed)
 Addended by: DAYNE SHERRY RAMAN on: 03/14/2024 04:17 PM   Modules accepted: Orders

## 2024-03-15 ENCOUNTER — Other Ambulatory Visit (HOSPITAL_COMMUNITY): Payer: Self-pay

## 2024-03-21 ENCOUNTER — Other Ambulatory Visit: Payer: Self-pay

## 2024-03-21 NOTE — Progress Notes (Signed)
 Specialty Pharmacy Initial Fill Coordination Note  [name] is a 83 y.o. female contacted today regarding initial fill of specialty medication(s) Benralizumab  (Fasenra  Pen)   Patient requested Courier to Provider Office   Delivery date: 03/23/24   Verified address: [address] [place], [place] [address]   Medication will be filled on 03/22/24.   Patient is aware of $0 copayment.

## 2024-03-22 ENCOUNTER — Other Ambulatory Visit: Payer: Self-pay

## 2024-03-27 ENCOUNTER — Other Ambulatory Visit (HOSPITAL_COMMUNITY): Payer: Self-pay

## 2024-03-27 ENCOUNTER — Ambulatory Visit (INDEPENDENT_AMBULATORY_CARE_PROVIDER_SITE_OTHER)

## 2024-03-27 ENCOUNTER — Other Ambulatory Visit

## 2024-03-27 ENCOUNTER — Other Ambulatory Visit: Payer: Self-pay

## 2024-03-27 DIAGNOSIS — Z79899 Other long term (current) drug therapy: Secondary | ICD-10-CM | POA: Diagnosis not present

## 2024-03-27 DIAGNOSIS — Z8709 Personal history of other diseases of the respiratory system: Secondary | ICD-10-CM

## 2024-03-27 DIAGNOSIS — Z5181 Encounter for therapeutic drug level monitoring: Secondary | ICD-10-CM

## 2024-03-27 LAB — HEPATIC FUNCTION PANEL
ALT: 12 U/L (ref 0–35)
AST: 22 U/L (ref 0–37)
Albumin: 3.7 g/dL (ref 3.5–5.2)
Alkaline Phosphatase: 45 U/L (ref 39–117)
Bilirubin, Direct: 0.1 mg/dL (ref 0.0–0.3)
Total Bilirubin: 0.3 mg/dL (ref 0.2–1.2)
Total Protein: 7.1 g/dL (ref 6.0–8.3)

## 2024-03-27 MED ORDER — FASENRA PEN 30 MG/ML ~~LOC~~ SOAJ
SUBCUTANEOUS | 0 refills | Status: AC
  Filled 2024-03-27: qty 2, fill #0
  Filled 2024-04-17: qty 1, 28d supply, fill #0

## 2024-03-27 NOTE — Patient Instructions (Signed)
 Your next Fasenra  dose is due on 04/24/24, 05/22/24, and every 8 weeks thereafter  CONTINUE Breo Ellipta  100-25 mcg/act (Inhale 1 puff into the lungs daily) and other medications as prescribed by Dr. Geronimo   Your prescription will be shipped from [facility]. Their phone number is [phone]  Someone will call to schedule shipment and confirm address. They will mail your medication to your home.  You will need to be seen by your provider in 3 to 4 months to assess how Fasenra  is working for you. Please ensure you have a follow-up appointment scheduled in November with Dr. Geronimo. Call our clinic at [phone] if you need to make this appointment.  Stay up to date on all routine vaccines: influenza, pneumonia, COVID19, Shingles  How to manage an injection site reaction: Remember the 5 C's: COUNTER - leave on the counter at least 30 minutes but up to overnight to bring medication to room temperature. This may help prevent stinging COLD - place something cold (like an ice gel pack or cold water bottle) on the injection site just before cleansing with alcohol . This may help reduce pain CLARITIN  - use Claritin  (generic name is loratadine ) for the first two weeks of treatment or the day of, the day before, and the day after injecting. This will help to minimize injection site reactions CORTISONE CREAM - apply if injection site is irritated and itching CALL ME - if injection site reaction is bigger than the size of your fist, looks infected, blisters, or if you develop hives

## 2024-03-27 NOTE — Progress Notes (Signed)
 HPI Patient presents today to [facility] to see pharmacy team for Fasenra  new start.  Past medical history includes RA-ILD, asthma, and postherpetic neuralgia. Followed by Dr. Geronimo, last OV 02/11/24. At that time, plan to add Fasenra  due to elevated EOS and multiple exacerbations in 2025. Per available prescription dispense history, there are two prednisone  fills in the last 6 months.   Of note, she notes that she is transitioning care from [facility] to [facility]. Per last [facility] note, there was a risk/benefit discussion on rituximab  therapy. Noted that last rituximab  infusion >1y ago. Two hospitalizations noted. Due to inflammatory arthritis symptom control, there was no present indication to restart rituximab  therapy at that time.  Today, she asks if Fasenra  would help with what rituximab  infusions were helping with. We discussed that Fasenra  and rituximab  work differently and are used for different indications.  Respiratory Medications Current regimen:  Breo Ellipta  100-25 mcg/act (Inhale 1 puff into the lungs daily) She is also taking Ofev  100mg  BID.   Patient reports no known adherence challenges  OBJECTIVE Allergies  Allergen Reactions   Penicillins Other (See Comments)    Unknown reaction  Did it involve swelling of the face/tongue/throat, SOB, or low BP? Unknown Did it involve sudden or severe rash/hives, skin peeling, or any reaction on the inside of your mouth or nose? Unknown Did you need to seek medical attention at a hospital or doctor's office? No When did it last happen?     childhood  If all above answers are NO, may proceed with cephalosporin use.     Remicade [Infliximab] Other (See Comments)    Unknown reaction    Outpatient Encounter Medications as of 03/27/2024  Medication Sig Note   acetaminophen  (TYLENOL ) 500 MG tablet Take 1,000 mg by mouth every 6 (six) hours as needed for moderate pain (pain score 4-6).    amitriptyline  (ELAVIL )  25 MG tablet Take 1 tablet (25 mg total) by mouth at bedtime.    benralizumab  (FASENRA  PEN) 30 MG/ML prefilled autoinjector Inject 30mg  (contents of one pen) in the skin on week 0 in clinic. Courier to pulm: [address], [address], [place] [address]. Appt on 03/27/24.    BREO ELLIPTA  100-25 MCG/ACT AEPB Inhale 1 puff into the lungs daily.    cholecalciferol  (VITAMIN D3) 25 MCG (1000 UNIT) tablet Take 1,000 Units by mouth every evening.    gabapentin  (NEURONTIN ) 300 MG capsule Take 600 mg by mouth at bedtime.    losartan  (COZAAR ) 25 MG tablet Take 12.5 mg by mouth daily.    methylPREDNISolone  (MEDROL ) 2 MG tablet Take 2 mg by mouth daily. 2 mg alternating with 4 mg daily    Nintedanib  (OFEV ) 100 MG CAPS Take 1 capsule (100 mg total) by mouth 2 (two) times daily.    ondansetron  (ZOFRAN ) 4 MG tablet TAKE ONE TABLET BY MOUTH EVERY 8 HOURS AS NEEDED FOR NAUSEA AND VOMITING    pantoprazole  (PROTONIX ) 40 MG tablet Take 40 mg by mouth daily.    polyethylene glycol (MIRALAX  / GLYCOLAX ) 17 g packet Take 17 g by mouth daily as needed for mild constipation. 08/30/2023: No last doses listed on MAR    pravastatin  (PRAVACHOL ) 20 MG tablet Take 1 tablet (20 mg total) by mouth daily.    predniSONE  (DELTASONE ) 10 MG tablet Take prednisone  40 mg daily x 2 days, then 20mg  daily x 2 days, then 10mg  daily x 2 days, then 5mg  daily x 2 days and then go  to baseline medrol  dosing. While on prednisone  hold baseline medrol     venlafaxine  XR (EFFEXOR -XR) 75 MG 24 hr capsule Take 75 mg by mouth daily.    No facility-administered encounter medications on file as of 03/27/2024.     Immunization History  Administered Date(s) Administered   Fluad Quad(high Dose 65+) 04/23/2020, 03/23/2022   INFLUENZA, HIGH DOSE SEASONAL PF 04/12/2017, 03/24/2018, 04/13/2019, 03/27/2021   Influenza Split 04/12/2013, 04/12/2014, 03/14/2015, 04/23/2016   Influenza, Quadrivalent, Recombinant, Inj, Pf 03/27/2021, 04/16/2023    Influenza-Unspecified 04/03/2013, 04/22/2017   Moderna Sars-Covid-2 Vaccination 08/25/2019, 09/22/2019, 03/07/2020   PNEUMOCOCCAL CONJUGATE-20 10/01/2022   Pfizer Covid-19 Vaccine Bivalent Booster 31yrs & up 05/16/2021   Pneumococcal Conjugate-13 04/03/2013, 09/28/2014   Pneumococcal Polysaccharide-23 07/13/2012, 07/24/2013   Respiratory Syncytial Virus Vaccine,Recomb Aduvanted(Arexvy) 04/12/2022   Td 01/11/2018   Tdap 12/10/2017     PFTs    Latest Ref Rng & Units 02/11/2024    8:20 AM 11/12/2023    8:20 AM 03/01/2023   11:45 AM 03/06/2022    3:01 PM 07/02/2021    3:07 PM 03/07/2021   11:05 AM 12/23/2020    1:03 PM  PFT Results  FVC-Pre L 1.57  1.47  1.81  1.62  1.69  1.78  1.66   FVC-Predicted Pre % 61  56  69  61  62  66  60   Pre FEV1/FVC % % 74  67  76  74  73  72  71   FEV1-Pre L 1.16  0.99  1.39  1.20  1.23  1.28  1.17   FEV1-Predicted Pre % 61  51  71  61  61  64  57   DLCO uncorrected ml/min/mmHg 9.17  8.70  11.53  12.57  15.35  12.94  14.98   DLCO UNC% % 47  45  59  65  79  66  77   DLCO corrected ml/min/mmHg 9.87   11.53  12.57  15.35  12.94  14.98   DLCO COR %Predicted % 51   59  65  79  66  77   DLVA Predicted % 71  67  78  84  100  84  86      Eosinophils Most recent blood eosinophil count was 400 cells/microL taken on 01/03/24.   IgE: 4 on 01/03/24  Assessment   Biologics training for benralizumab  (Fasenra )  Goals of therapy: Mechanism of action: humanized afucosylated, monoclonal antibody (IgG1, kappa) that binds to the alpha subunit of the interleukin-5 receptor. IL-5 is the major cytokine responsible for the growth and differentiation, recruitment, activation, and survival of eosinophils (a cell type associated with inflammation and an important component in the pathogenesis of asthma) Reviewed that Fasenra  is add-on medication and patient must continue maintenance inhaler regimen. Response to therapy: may take 4 months to determine efficacy. Discussed that  patients generally feel improvement sooner than 4 months.  Side effects: antibody development (13%), headache (8%), pharyngitis (5%), injection site reaction (2.2%)  Dose: 30 mg subcutaneously at Week 0, Week 4, Week 8, then 30mg  every 8 weeks thereafter  Administration/Storage:   Reviewed administration sites of thigh or abdomen (at least 2-3 inches away from abdomen). Reviewed the upper arm is only appropriate if caregiver is administering injection  Do not shake the pen/syringe as this could lead to product foaming or precipitation. \ Reviewed storage of medication in refrigerator. Reviewed that Fasenra  can be stored at room temperature in unopened carton for up to 14 days.  Side  effects: headache (19%), injection site reaction (7-15%), antibody development (6%), backache (5%), fatigue (5%)  Access: Approval of Fasenra  through: insurance  Patient self-administered Fasenra  30mg /mL in right lower abdomen using [facility]-supplied medication.  Fasenra  30mg /mL autoinjector pen NDC: [id number] Lot: [id number] Expiration: 2027-11  Patient monitored for 20 minutes for adverse reaction. Of note, shortened observation time because patient's ride had to get to work. Unable to wait 30 minutes for observation. Patient tolerated well.  Medication Reconciliation  A drug regimen assessment was performed, including review of allergies, interactions, disease-state management, dosing and immunization history. Medications were reviewed with the patient, including name, instructions, indication, goals of therapy, potential side effects, importance of adherence, and safe use.  Drug interaction(s): none noted  PLAN Continue Fasenra  30mg  at Week 4 on 04/24/24 and Week 8 on 05/22/24. Then continue Fasenra  30mg  every 8 weeks thereafter starting on 07/17/2024. Rx sent to: [facility]: [phone] . Patient provided with pharmacy phone number.  Continue maintenance asthma regimen of: Breo  Ellipta 100-25 mcg/act (Inhale 1 puff into the lungs daily) Patient is also due for LFTs while on Ofev . Last LFTs wnl in June 2025. She was scheduled for lab visit; order entered.  All questions encouraged and answered.  Instructed patient to reach out with any further questions or concerns.  Thank you for allowing pharmacy to participate in this patient's care.  This appointment required 45 minutes of patient care (this includes precharting, chart review, review of results, face-to-face care, etc.).

## 2024-03-28 ENCOUNTER — Ambulatory Visit: Payer: Self-pay | Admitting: Internal Medicine

## 2024-03-29 ENCOUNTER — Other Ambulatory Visit

## 2024-04-13 ENCOUNTER — Other Ambulatory Visit: Payer: Self-pay

## 2024-04-17 ENCOUNTER — Other Ambulatory Visit: Payer: Self-pay

## 2024-04-19 ENCOUNTER — Other Ambulatory Visit: Payer: Self-pay

## 2024-04-21 ENCOUNTER — Other Ambulatory Visit (HOSPITAL_BASED_OUTPATIENT_CLINIC_OR_DEPARTMENT_OTHER): Payer: Self-pay | Admitting: Cardiology

## 2024-04-21 ENCOUNTER — Other Ambulatory Visit: Payer: Self-pay

## 2024-04-21 DIAGNOSIS — E7800 Pure hypercholesterolemia, unspecified: Secondary | ICD-10-CM

## 2024-04-21 NOTE — Progress Notes (Signed)
 Patient started Fasenra  in clinic on 03/27/24. Counseling complete at that time. See note for details on 03/30/24. Able to self-administer. Tolerated well.   PLAN Continue Fasenra  30mg  at Week 4 on 04/24/24 and Week 8 on 05/22/24. Then continue Fasenra  30mg  every 8 weeks thereafter starting on 07/17/2024. Rx sent to: [facility]: [phone] . Patient provided with pharmacy phone number.  Continue maintenance asthma regimen of: Breo Ellipta  100-25 mcg/act (Inhale 1 puff into the lungs daily) Patient is also due for LFTs while on Ofev . Last LFTs wnl in June 2025. She was scheduled for lab visit; order entered.   All questions encouraged and answered.  Instructed patient to reach out with any further questions or concerns.  Aleck Puls, PharmD, BCPS, CPP Clinical Pharmacist  [facility]

## 2024-05-09 IMAGING — DX DG THORACIC SPINE 2V
3 series · 3 of 3 positions shown · non-contrast
Comparison: Chest x-ray 10/08/2021

CLINICAL DATA: Right thoracic pain from fall this morning.

EXAM:
THORACIC SPINE 2 VIEWS

[t-spine ap]
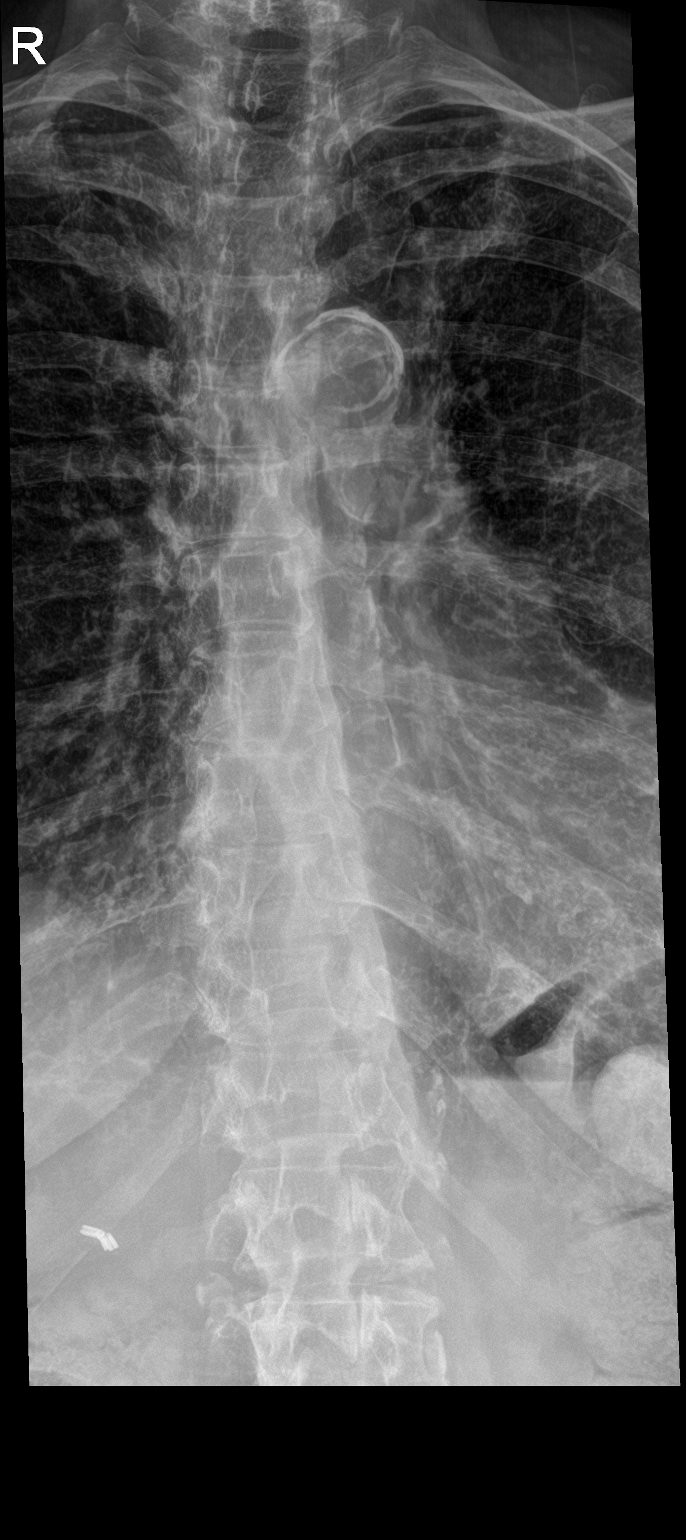

[t-spine lat]
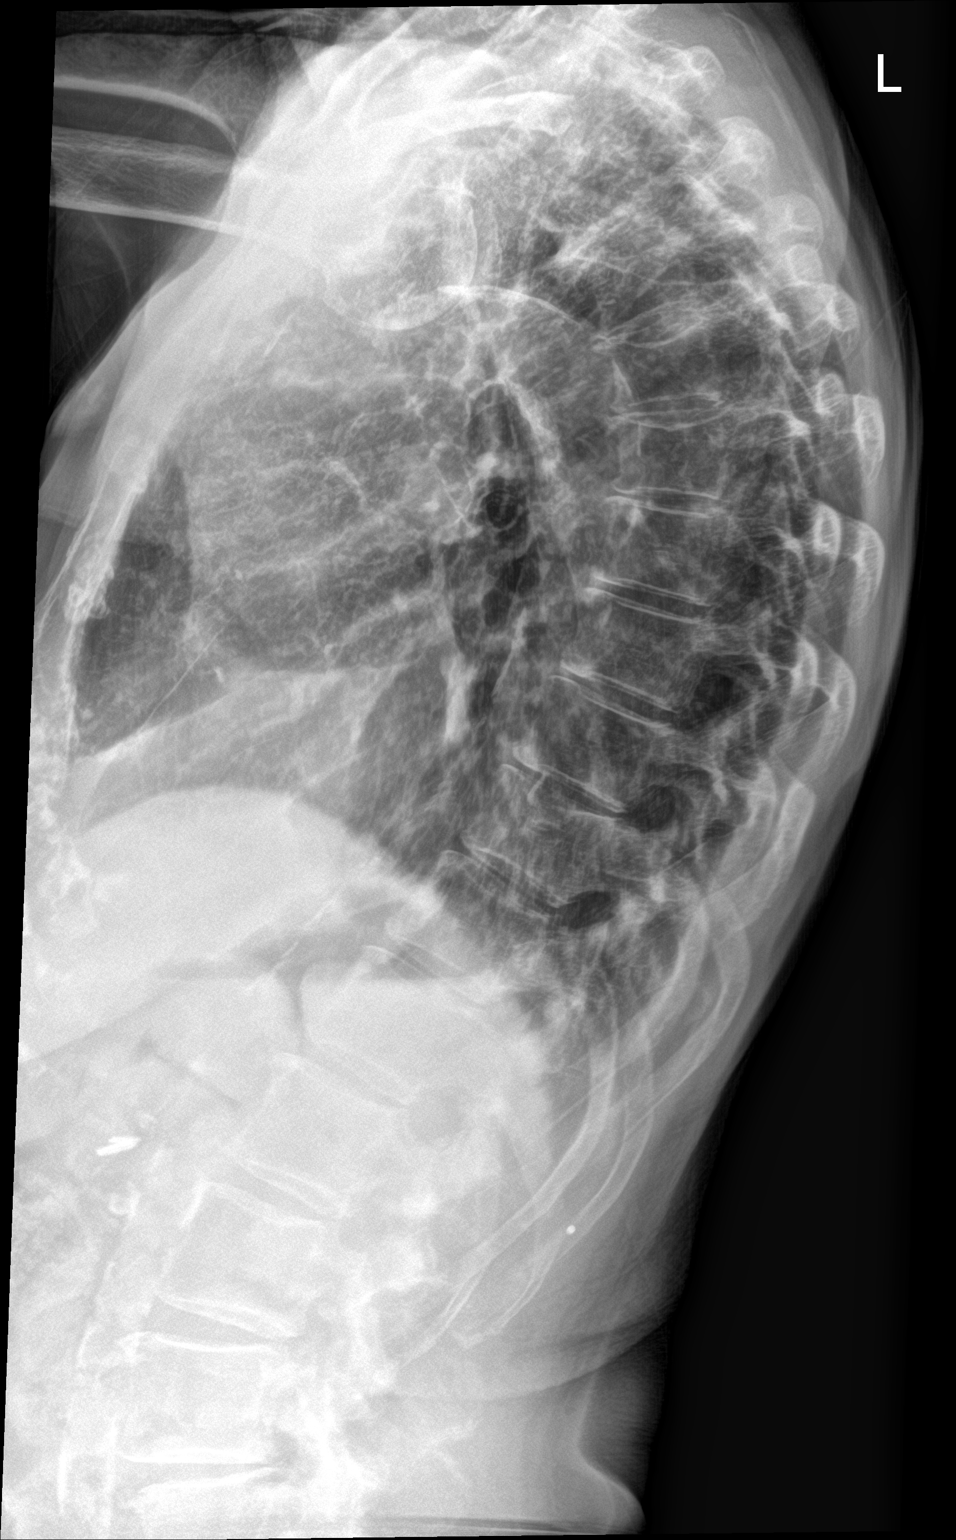

[t-spine swimmers]
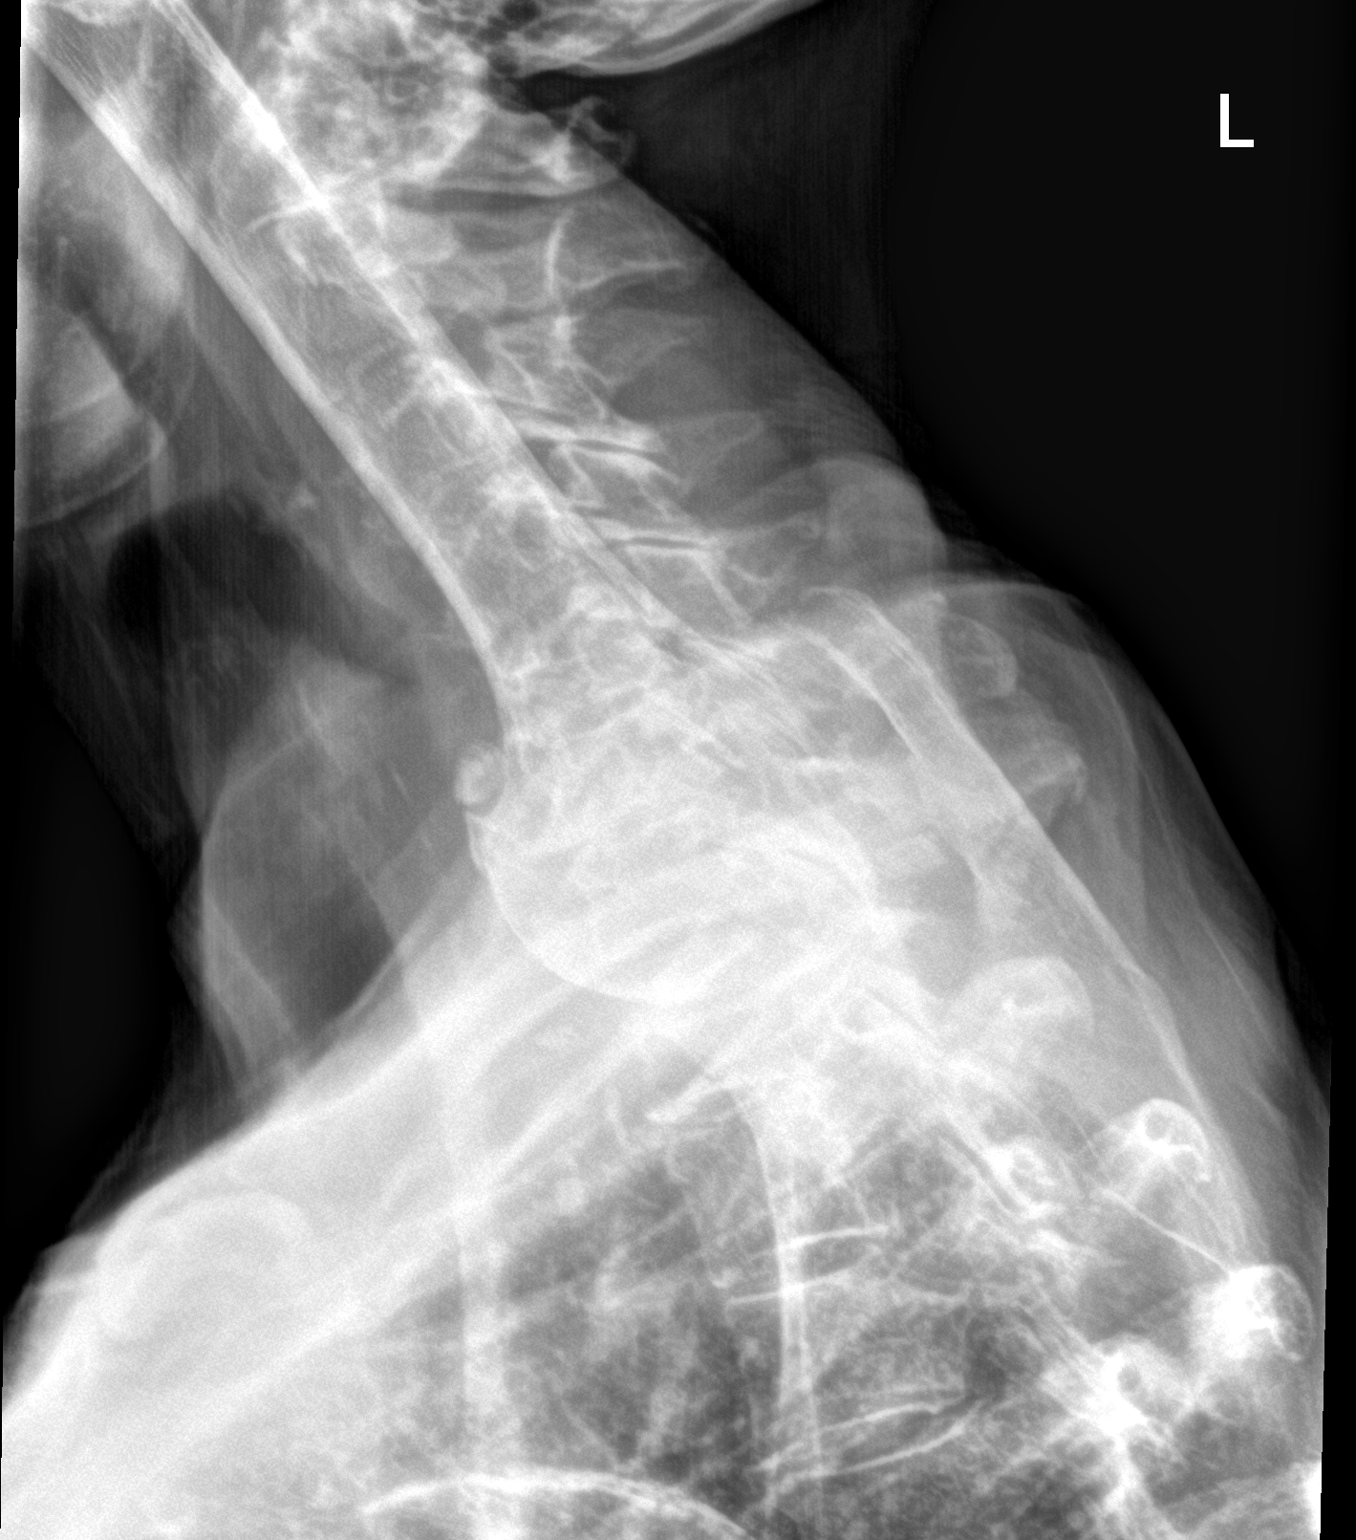

[3 of 3 positions shown; findings below may reference images not displayed]

FINDINGS: Vertebral body alignment is normal. Subtle loss of mid vertebral
body height of 2 adjacent upper thoracic vertebral bodies unchanged.
No acute compression fracture or subluxation. Minimal spondylosis of
the thoracic spine.
IMPRESSION: 1. No acute findings.
2. Mild spondylosis of the thoracic spine.

## 2024-05-09 IMAGING — CT CT CERVICAL SPINE W/O CM
3 of 4 series · 13 of 33 positions shown, 16 images · non-contrast
Comparison: Head and cervical spine dated [REDACTED] 2927

CLINICAL DATA: Neck pain



[Series 3: c_spine 2.0 i30s 3 · axial · 0.34mm/px · z∈[-42,+64]mm · 5 of 78 slices shown, 7 images]
[im 13/78  soft-tissue]
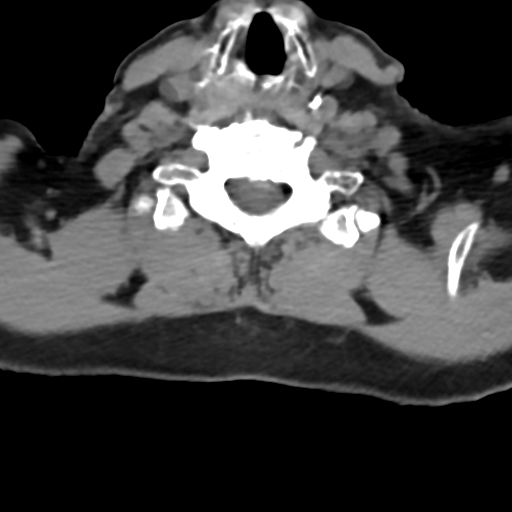
[im 13/78  bone]
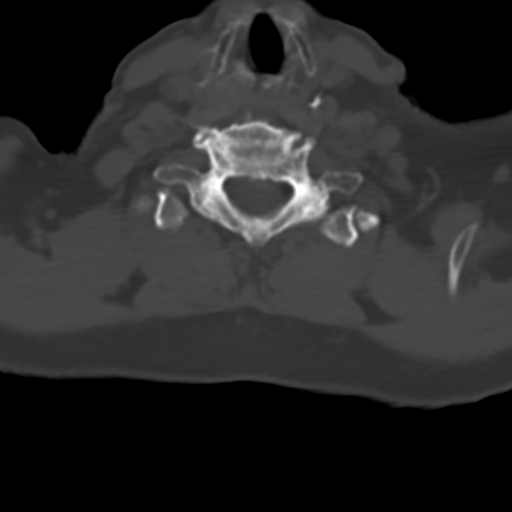
[im 26/78  bone]
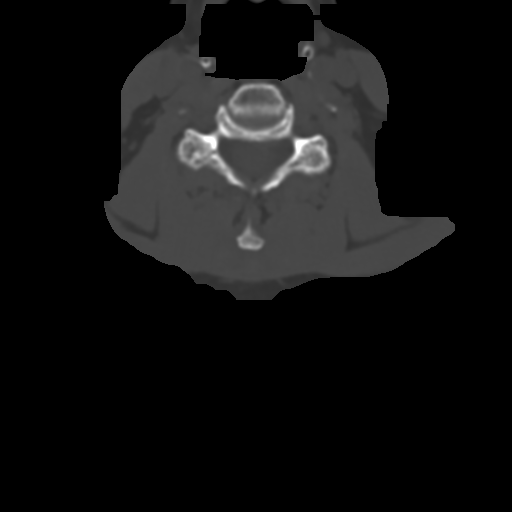
[im 39/78  bone]
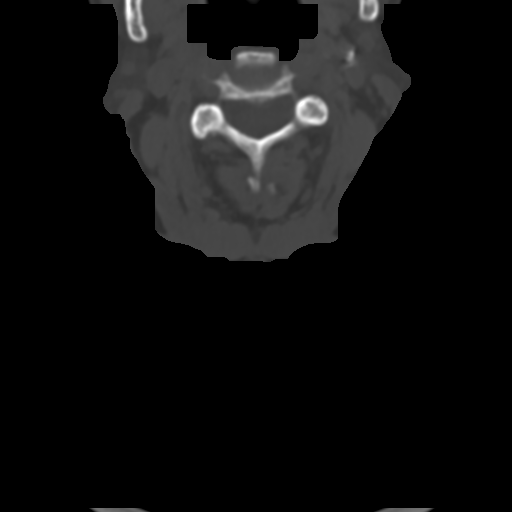
[im 52/78  bone]
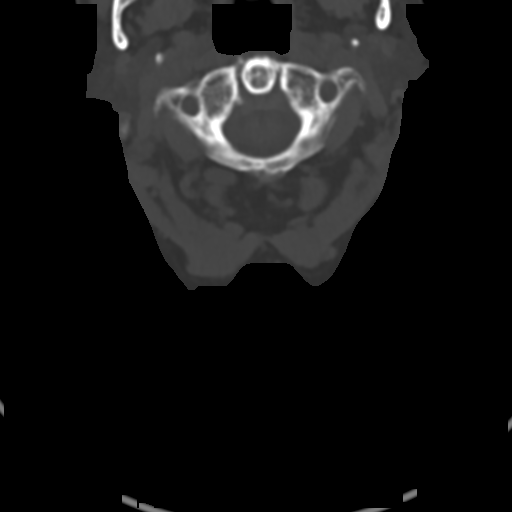
[im 65/78  soft-tissue]
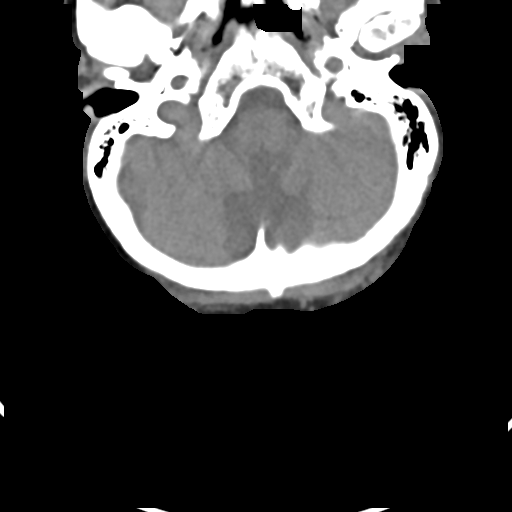
[im 65/78  bone]
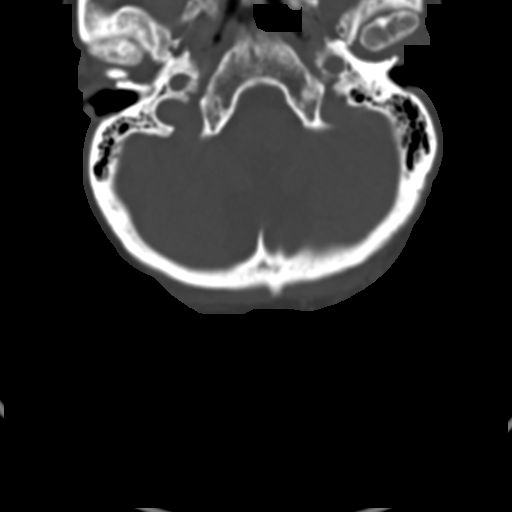

[Series 5: coronals · coronal · 0.23mm/px · 3 of 61 slices shown]
[im 13/61  bone]
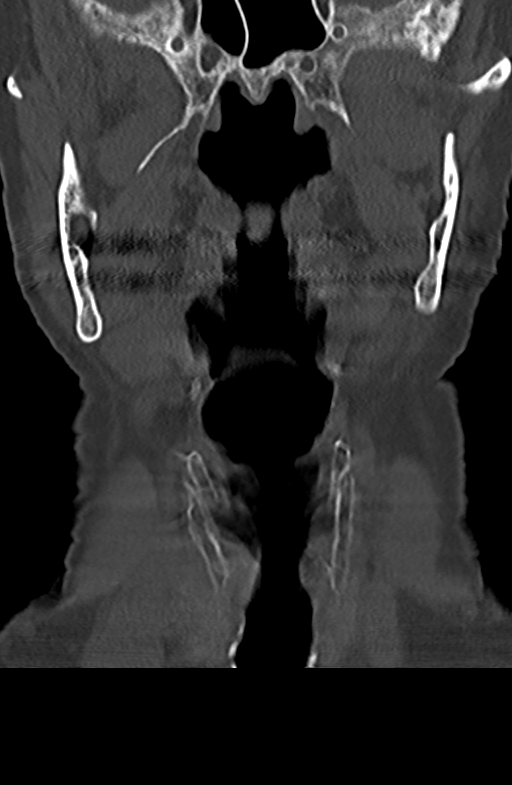
[im 25/61  bone]
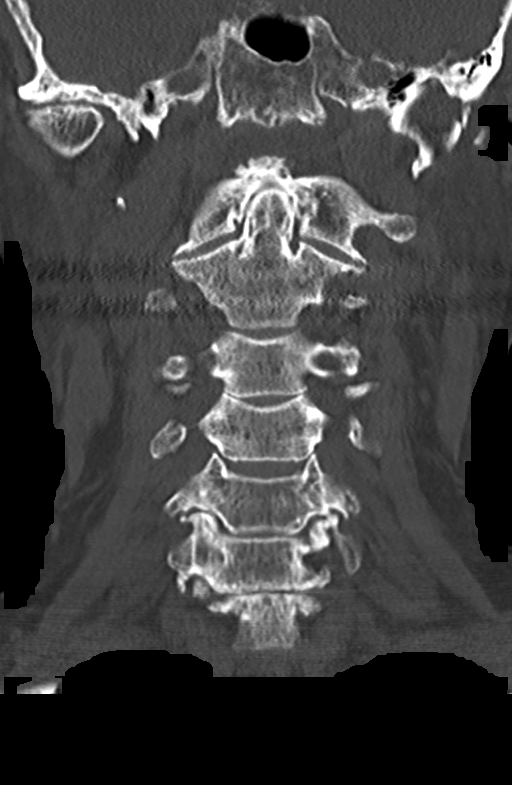
[im 37/61  bone]
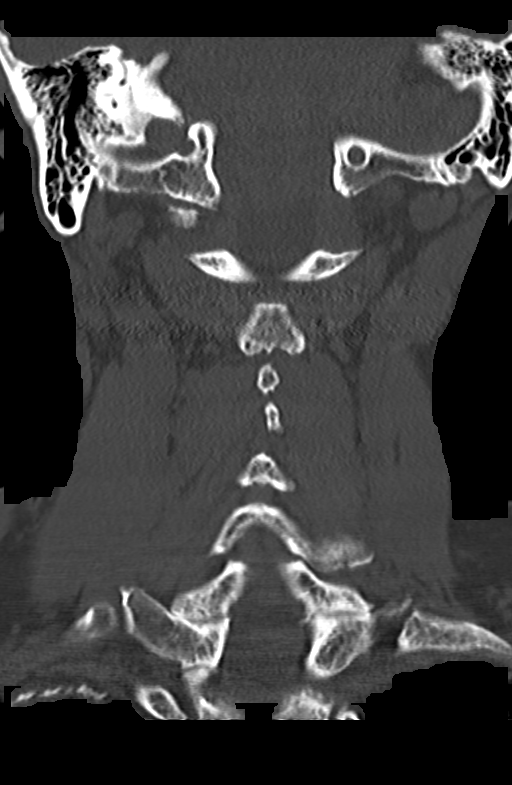

[Series 6: sagittals · sagittal · 0.29mm/px · 5 of 61 slices shown, 6 images]
[im 21/61  bone]
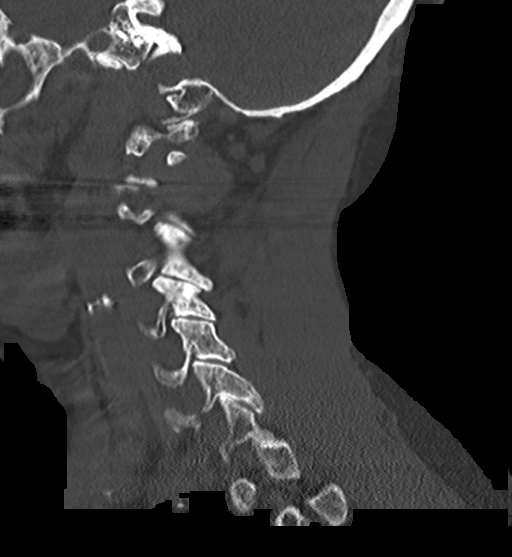
[im 26/61  bone]
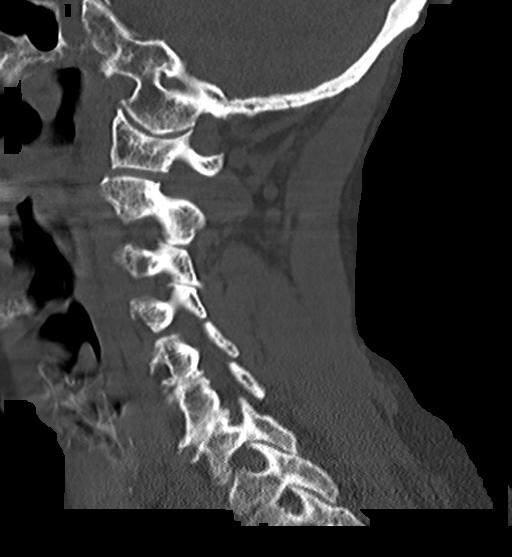
[im 31/61  soft-tissue]
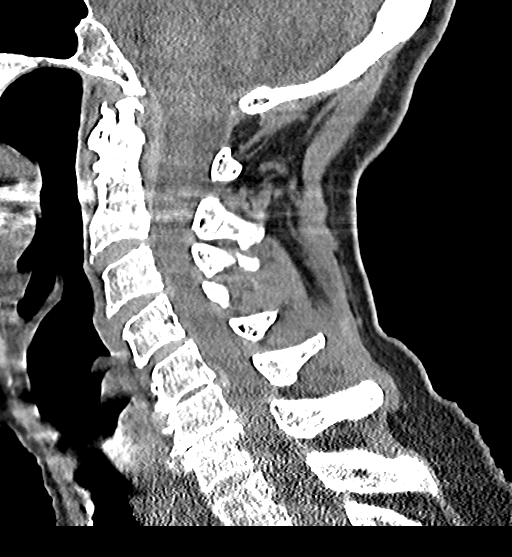
[im 31/61  bone]
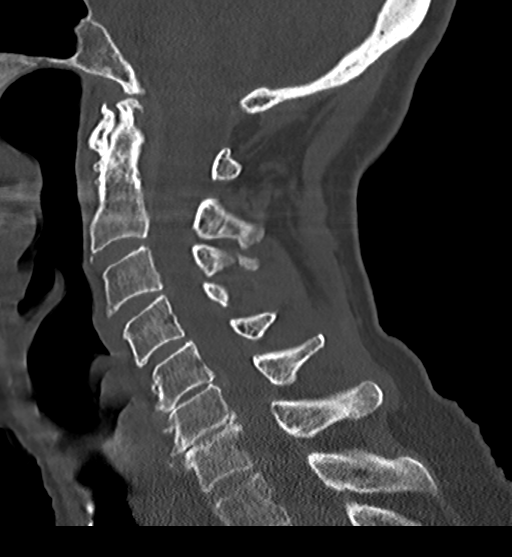
[im 36/61  bone]
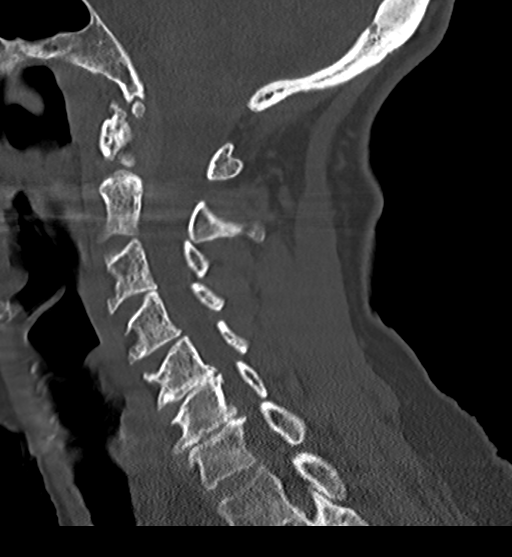
[im 41/61  bone]
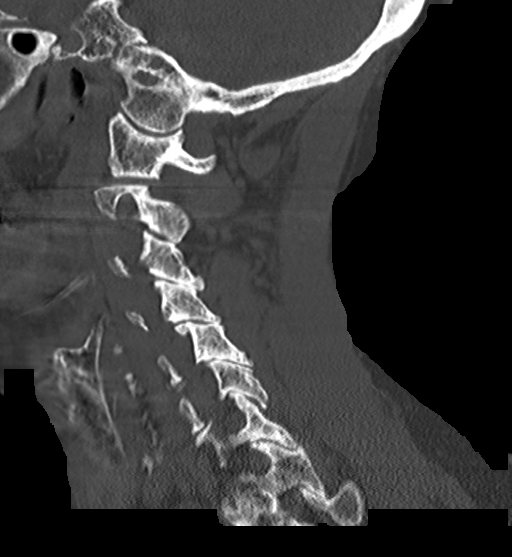

[13 of 33 positions shown; findings below may reference images not displayed]

FINDINGS: CT HEAD FINDINGS

Brain: Global atrophy which is likely age related. Old left basal
ganglia lacunar infarct. No evidence of acute infarction,
hemorrhage, hydrocephalus, extra-axial collection or mass
lesion/mass effect.

Vascular: No hyperdense vessel or unexpected calcification.

Skull: Normal. Negative for fracture or focal lesion.

Sinuses/Orbits: No acute finding.

Other: None.

CT CERVICAL SPINE FINDINGS

Alignment: Normal.

Skull base and vertebrae: No acute fracture. No primary bone lesion
or focal pathologic process.

Soft tissues and spinal canal: No prevertebral fluid or swelling. No
visible canal hematoma.

Disc levels:  Moderate degenerative disc disease at C5-C6 and C6-C7

Upper chest: Right-greater-than-left biapical pleural-parenchymal
scarring, unchanged compared with prior exam

Other: None.
IMPRESSION: 1. No acute intracranial abnormality.
2. No evidence of acute cervical spine fracture or traumatic
malalignment.

## 2024-05-09 IMAGING — CT CT HEAD W/O CM
3 series · 14 of 47 positions shown, 16 images · non-contrast
Comparison: Head and cervical spine dated [REDACTED] 2927

CLINICAL DATA: Neck pain



[Series 2: head 5.0 h30s · axial · 0.49mm/px · z∈[+52,+192]mm · 8 of 34 slices shown, 10 images]
[im 3/34  brain]
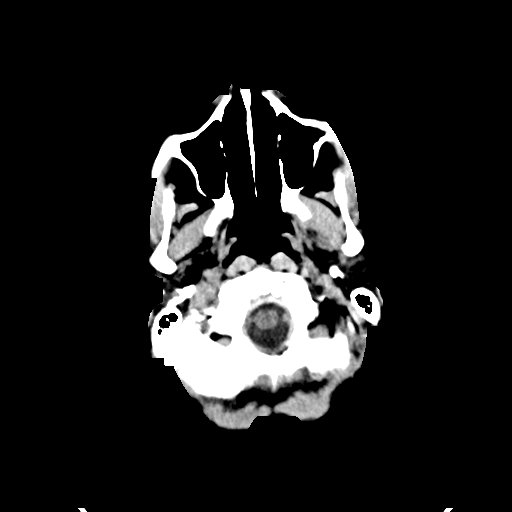
[im 3/34  bone]
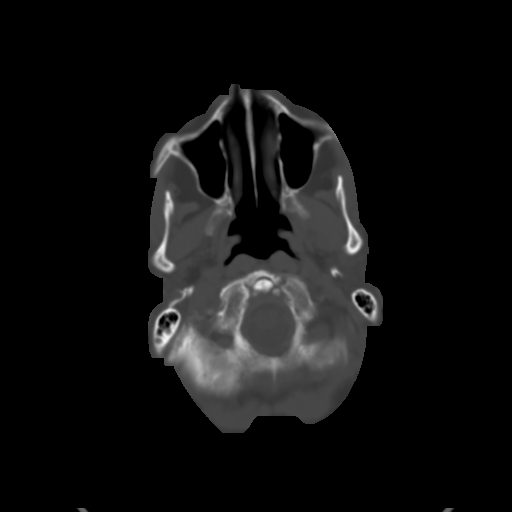
[im 7/34  brain]
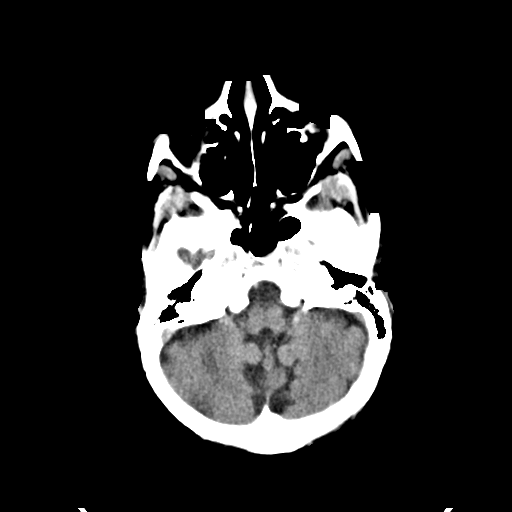
[im 11/34  brain]
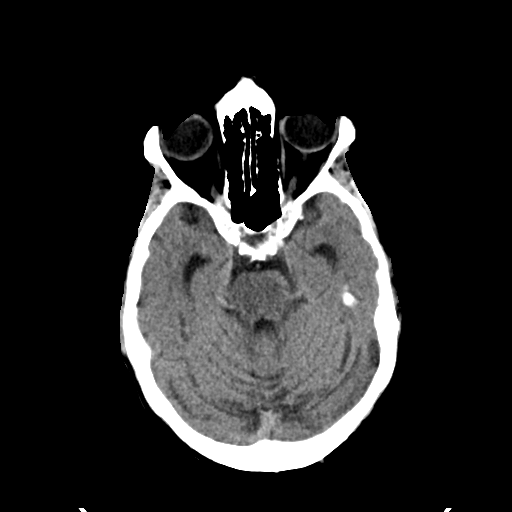
[im 15/34  brain]
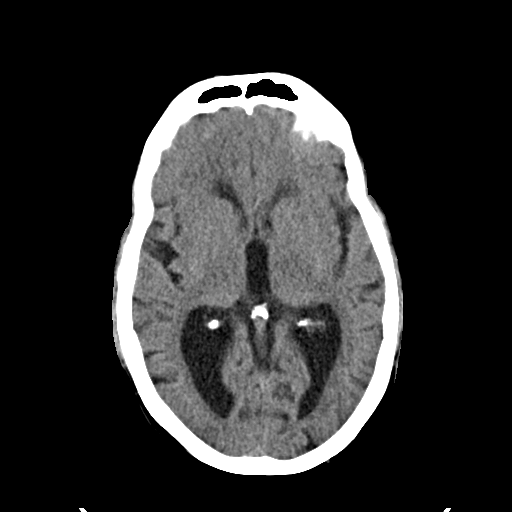
[im 19/34  brain]
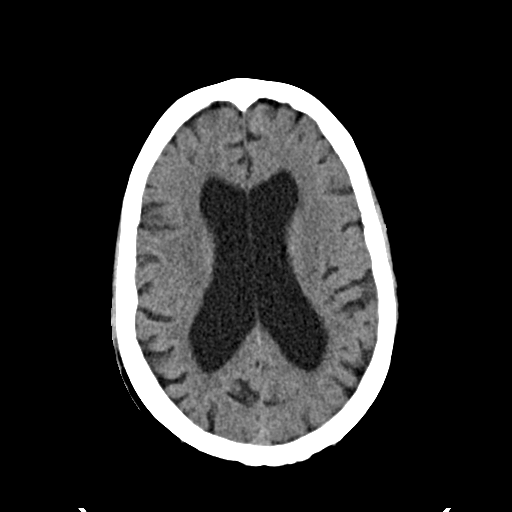
[im 19/34  bone]
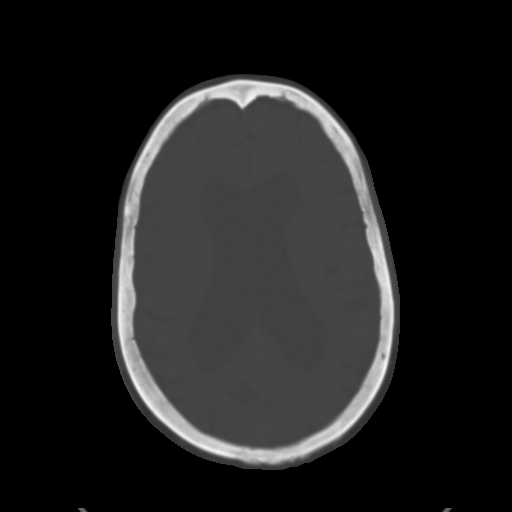
[im 23/34  brain]
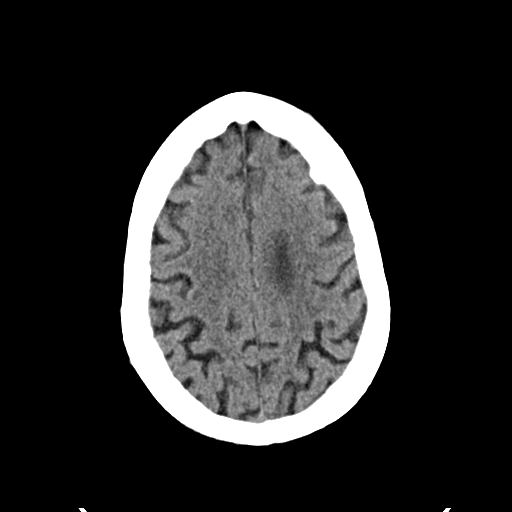
[im 27/34  brain]
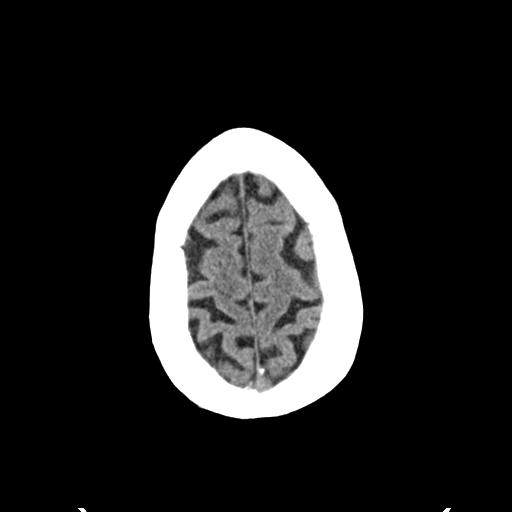
[im 31/34  brain]
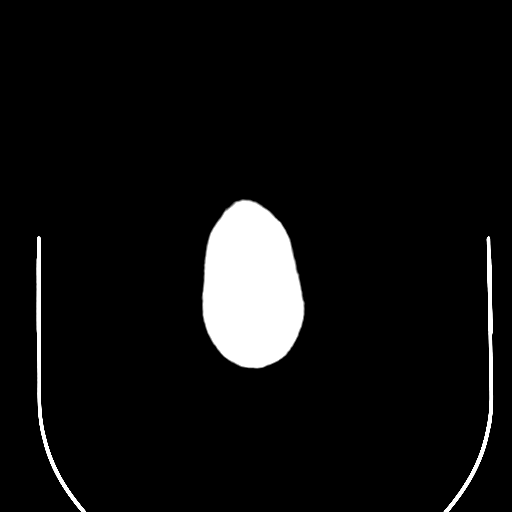

[Series 4: head 3.0 mpr cor · coronal · 0.33mm/px · 3 of 76 slices shown]
[im 26/76  brain]
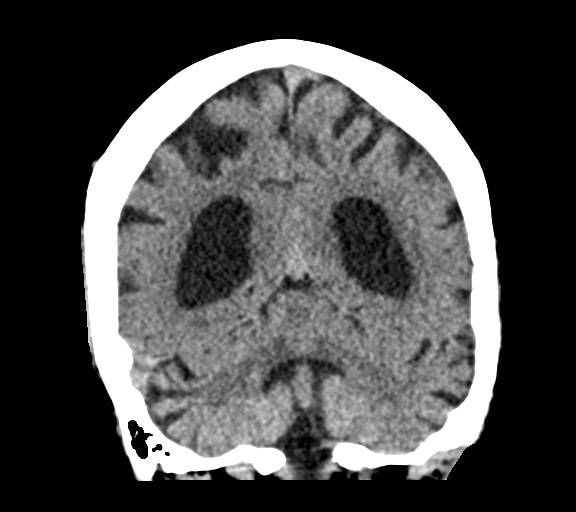
[im 34/76  brain]
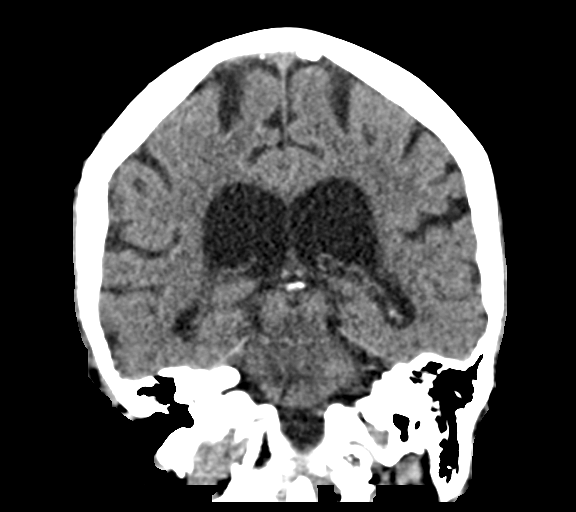
[im 42/76  brain]
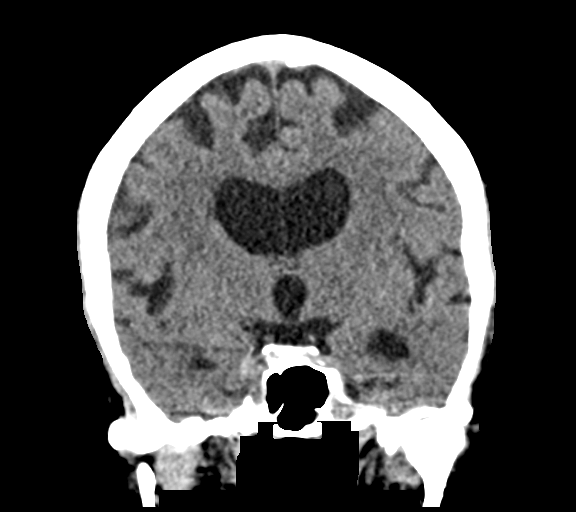

[Series 5: head 3.0 mpr sag · sagittal · 0.33mm/px · 3 of 65 slices shown]
[im 22/65  brain]
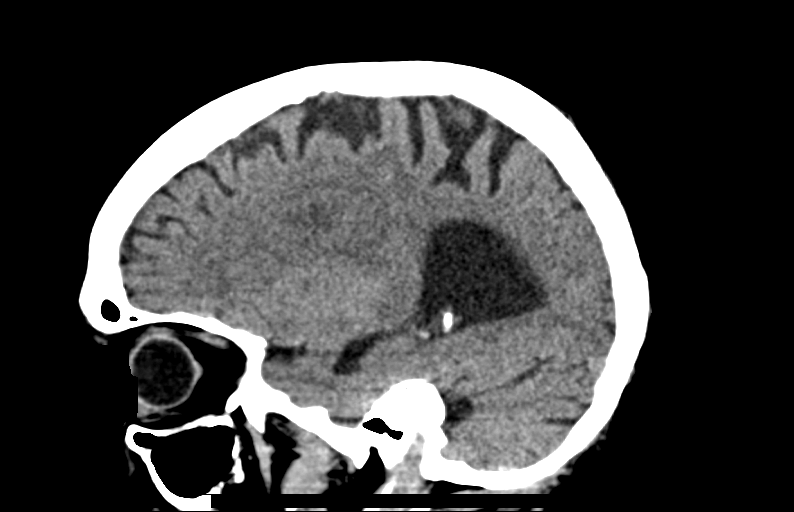
[im 33/65  brain]
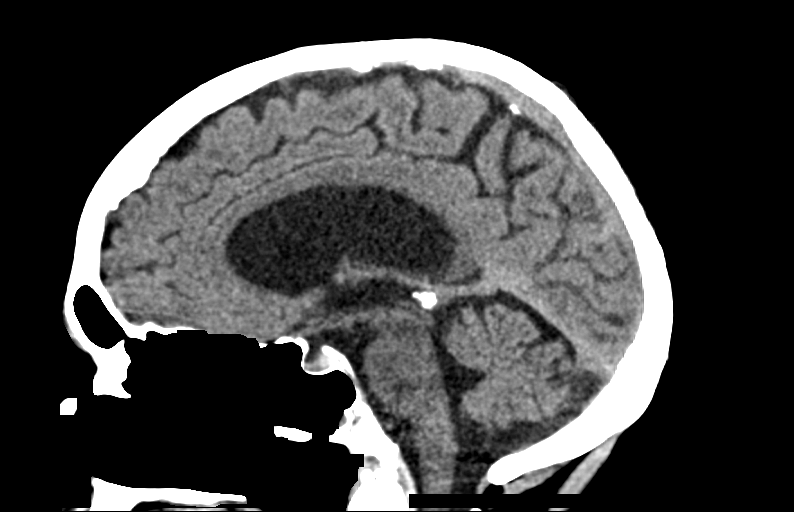
[im 43/65  brain]
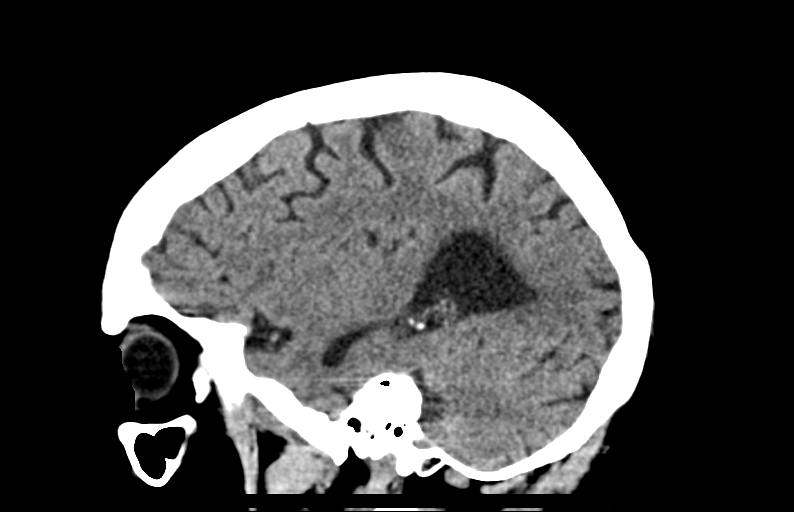

[14 of 47 positions shown; findings below may reference images not displayed]

FINDINGS: CT HEAD FINDINGS

Brain: Global atrophy which is likely age related. Old left basal
ganglia lacunar infarct. No evidence of acute infarction,
hemorrhage, hydrocephalus, extra-axial collection or mass
lesion/mass effect.

Vascular: No hyperdense vessel or unexpected calcification.

Skull: Normal. Negative for fracture or focal lesion.

Sinuses/Orbits: No acute finding.

Other: None.

CT CERVICAL SPINE FINDINGS

Alignment: Normal.

Skull base and vertebrae: No acute fracture. No primary bone lesion
or focal pathologic process.

Soft tissues and spinal canal: No prevertebral fluid or swelling. No
visible canal hematoma.

Disc levels:  Moderate degenerative disc disease at C5-C6 and C6-C7

Upper chest: Right-greater-than-left biapical pleural-parenchymal
scarring, unchanged compared with prior exam

Other: None.
IMPRESSION: 1. No acute intracranial abnormality.
2. No evidence of acute cervical spine fracture or traumatic
malalignment.

## 2024-05-19 ENCOUNTER — Other Ambulatory Visit: Payer: Self-pay | Admitting: Internal Medicine

## 2024-05-25 DIAGNOSIS — G9389 Other specified disorders of brain: Secondary | ICD-10-CM | POA: Diagnosis not present

## 2024-05-25 DIAGNOSIS — J9611 Chronic respiratory failure with hypoxia: Secondary | ICD-10-CM | POA: Diagnosis not present

## 2024-05-25 DIAGNOSIS — J4540 Moderate persistent asthma, uncomplicated: Secondary | ICD-10-CM | POA: Diagnosis not present

## 2024-05-25 DIAGNOSIS — D8989 Other specified disorders involving the immune mechanism, not elsewhere classified: Secondary | ICD-10-CM | POA: Diagnosis not present

## 2024-05-25 DIAGNOSIS — I2584 Coronary atherosclerosis due to calcified coronary lesion: Secondary | ICD-10-CM | POA: Diagnosis not present

## 2024-05-25 DIAGNOSIS — M069 Rheumatoid arthritis, unspecified: Secondary | ICD-10-CM | POA: Diagnosis not present

## 2024-05-25 DIAGNOSIS — J449 Chronic obstructive pulmonary disease, unspecified: Secondary | ICD-10-CM | POA: Diagnosis not present

## 2024-05-25 DIAGNOSIS — J849 Interstitial pulmonary disease, unspecified: Secondary | ICD-10-CM | POA: Diagnosis not present

## 2024-05-25 DIAGNOSIS — M0510 Rheumatoid lung disease with rheumatoid arthritis of unspecified site: Secondary | ICD-10-CM | POA: Diagnosis not present

## 2024-05-25 DIAGNOSIS — I771 Stricture of artery: Secondary | ICD-10-CM | POA: Diagnosis not present

## 2024-05-25 DIAGNOSIS — I87319 Chronic venous hypertension (idiopathic) with ulcer of unspecified lower extremity: Secondary | ICD-10-CM | POA: Diagnosis not present

## 2024-05-25 DIAGNOSIS — I2510 Atherosclerotic heart disease of native coronary artery without angina pectoris: Secondary | ICD-10-CM | POA: Diagnosis not present

## 2024-05-31 DIAGNOSIS — Z85828 Personal history of other malignant neoplasm of skin: Secondary | ICD-10-CM | POA: Diagnosis not present

## 2024-05-31 DIAGNOSIS — L821 Other seborrheic keratosis: Secondary | ICD-10-CM | POA: Diagnosis not present

## 2024-05-31 DIAGNOSIS — D225 Melanocytic nevi of trunk: Secondary | ICD-10-CM | POA: Diagnosis not present

## 2024-05-31 DIAGNOSIS — L817 Pigmented purpuric dermatosis: Secondary | ICD-10-CM | POA: Diagnosis not present

## 2024-06-19 ENCOUNTER — Encounter: Payer: Self-pay | Admitting: Internal Medicine

## 2024-06-19 ENCOUNTER — Ambulatory Visit: Attending: Internal Medicine | Admitting: Internal Medicine

## 2024-06-19 VITALS — BP 120/65 | HR 73 | Temp 95.5°F | Resp 18 | Ht 64.5 in | Wt 125.4 lb

## 2024-06-19 DIAGNOSIS — M0579 Rheumatoid arthritis with rheumatoid factor of multiple sites without organ or systems involvement: Secondary | ICD-10-CM | POA: Diagnosis present

## 2024-06-19 DIAGNOSIS — J849 Interstitial pulmonary disease, unspecified: Secondary | ICD-10-CM | POA: Diagnosis present

## 2024-06-19 DIAGNOSIS — M8580 Other specified disorders of bone density and structure, unspecified site: Secondary | ICD-10-CM | POA: Diagnosis present

## 2024-06-19 DIAGNOSIS — Z79899 Other long term (current) drug therapy: Secondary | ICD-10-CM | POA: Diagnosis present

## 2024-06-19 DIAGNOSIS — R296 Repeated falls: Secondary | ICD-10-CM | POA: Diagnosis present

## 2024-06-19 DIAGNOSIS — R5381 Other malaise: Secondary | ICD-10-CM

## 2024-06-19 MED ORDER — METHYLPREDNISOLONE 8 MG PO TABS: 8.0000 mg | ORAL_TABLET | Freq: Every day | ORAL | 0 refills | Status: DC

## 2024-06-19 NOTE — Progress Notes (Signed)
 "  Office Visit Note  Patient: Stacey [name]             Date of Birth: December 14, 1940           MRN: [id number]             PCP: Onita Rush, MD Referring: Geronimo Amel, MD Visit Date: 06/19/2024 Occupation: Data Unavailable  Subjective:  New Patient (Initial Visit) (Re-establish care , previously seen at [facility] )   Discussed the use of AI scribe software for clinical note transcription with the patient, who gave verbal consent to proceed.  History of Present Illness   Stacey [name] is an 83 year old female with rheumatoid arthritis and interstitial lung disease who presents with increased rheumatoid arthritis symptoms. She was referred by [facility] rheumatology for local management.  She has experienced an increase in rheumatoid arthritis symptoms, particularly flares in her hands, and persistent fatigue. She has been off Rituxan  for a year and a half, which she previously used for about a year with good effect. She has not been on any rheumatoid arthritis medications during this time besides low dose steroids with medrol  alternating 4 mg and 2 mg daily.  Her rheumatoid arthritis was diagnosed approximately 24 years ago, and she has been on various treatments including methotrexate, Remicade, Enbrel, Humira, Orencia , Kineret, and Rituxan . She has not had any recent steroid tapers or dose packs.  She has been monitored for interstitial lung disease for about three years and is currently on Ofev . She recently started Fasenra  for her lung condition, having received one shot so far.  She experienced pneumonia several times in the past few years and had COVID-19 three times, each time requiring hospitalization.  She reports a dry mouth, blue fingertips, swelling in her feet and ankles, and occasional numbness in her toes. She has a history of falls, the last occurring last year, attributed to mobility and balance issues. She has undergone physical therapy in the past and is  seeking to resume it at [facility].  She moved to [facility] earlier this year following the sudden death of her husband, who was hit by a car.       DMARD Hx Remicade Enbrel Arava Orencia  (2014) Rituxan  (1000mg  IV x2, 07/2018, 1000mg  x2 Jul/Aug 2020, Oct 2021, 04/2021, 11/2020). Reclast  05/2020, 06/2021.   Activities of Daily Living:  Patient reports morning stiffness for 1 hour.   Patient Denies nocturnal pain.  Difficulty dressing/grooming: Denies Difficulty climbing stairs: Reports Difficulty getting out of chair: Reports Difficulty using hands for taps, buttons, cutlery, and/or writing: Reports  Review of Systems  Constitutional:  Positive for fatigue.  HENT:  Negative for mouth sores and mouth dryness.   Eyes:  Negative for dryness.  Respiratory:  Positive for shortness of breath.   Cardiovascular:  Negative for chest pain and palpitations.  Gastrointestinal:  Positive for constipation. Negative for blood in stool and diarrhea.  Endocrine: Negative for increased urination.  Genitourinary:  Positive for involuntary urination.  Musculoskeletal:  Positive for joint pain, gait problem, joint pain, myalgias, morning stiffness and myalgias. Negative for joint swelling, muscle weakness and muscle tenderness.  Skin:  Negative for color change, rash, hair loss and sensitivity to sunlight.  Allergic/Immunologic: Positive for susceptible to infections.  Neurological:  Positive for dizziness. Negative for headaches.  Hematological:  Negative for swollen glands.  Psychiatric/Behavioral:  Negative for depressed mood and sleep disturbance. The patient is not nervous/anxious.     PMFS History:  Patient Active  Problem List   Diagnosis Date Noted   Multiple falls 06/19/2024   Encephalopathy 08/30/2023   Fluid overload 08/30/2023   Cellulitis 08/30/2023   Anxiety 08/16/2023   Hyponatremia 08/16/2023   Influenza A 08/16/2023   Acute respiratory failure with hypoxia (HCC) 08/15/2023    Protein-calorie malnutrition, severe 06/23/2023   Multiple rib fractures 06/22/2023   Rib fracture 06/21/2023   AKI (acute kidney injury) 06/21/2023   Debility 06/21/2023   Intractable nausea and vomiting 11/18/2022   Nausea & vomiting 11/17/2022   Chronic cough 06/12/2020   Constipation    CAP (community acquired pneumonia) 02/09/2020   Acute on chronic respiratory failure with hypoxia (HCC) 07/06/2019   Fall at home, initial encounter 07/06/2019   Orthostatic hypotension 07/06/2019   Rib fractures 07/04/2019   Pneumonia due to COVID-19 virus 06/11/2019   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HCC) 05/07/2019   Acute asthma exacerbation 12/18/2018   Elevated troponin 12/18/2018   Acute bronchitis 08/30/2018   Chronic respiratory failure with hypoxia (HCC) 04/06/2018   Essential hypertension 03/24/2018   Depression 03/24/2018   Asthma exacerbation 03/23/2018   DOE (dyspnea on exertion) 03/21/2018   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 esophagitis 03/21/2018   Chronic lung disease    Hypoxia    Dyspnea 01/23/2018   ILD (interstitial lung disease) (HCC) 01/23/2018   Claw toe, acquired, 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   Restrictive lung disease 07/18/2013   Multiple pulmonary nodules/RA lung dz  05/29/2013   Cough variant asthma vs uacs  05/29/2013   Rheumatoid arthritis (HCC) 01/13/2012   Osteopenia 01/13/2012   Lichen sclerosus et atrophicus of the vulva 01/13/2012   Asthma 01/13/2012    Past Medical History:  Diagnosis Date   Abnormal finding of blood chemistry    Asthma    H/O measles    H/O varicella    Hypertension    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HCC)    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   Lichen sclerosus 05/26/2006   Asymptomatic   Low iron    Mitral valve prolapse    Osteoarthritis    Osteoporosis    Pneumonia    Post herpetic neuralgia    Rheumatoid arthritis(714.0)    Yeast infection     Family History  Problem Relation Age of Onset   Asthma Mother    Anemia Mother    Polymyalgia rheumatica Mother     COPD Father    Pulmonary fibrosis Father    Seizures Sister    Breast cancer Maternal Grandmother 48   Healthy Daughter    Healthy Son    Past Surgical History:  Procedure Laterality Date   CHOLECYSTECTOMY  07/13/2009   IR ANGIO INTRA EXTRACRAN SEL COM CAROTID INNOMINATE BILAT MOD SED  06/18/2020   IR ANGIO VERTEBRAL SEL SUBCLAVIAN INNOMINATE UNI R MOD SED  06/18/2020   IR ANGIO VERTEBRAL SEL VERTEBRAL UNI L MOD SED  06/18/2020   WISDOM TOOTH EXTRACTION     Social History   Tobacco Use   Smoking status: Never    Passive exposure: Past   Smokeless tobacco: Never  Vaping Use   Vaping status: Never Used  Substance Use Topics   Alcohol  use: No   Drug use: No   Social History   Social History Narrative   Drinks 1 cup of coffee and 1 diet coke a day      Immunization History  Administered Date(s) Administered   Fluad Quad(high Dose 65+) 04/23/2020, 03/23/2022, 05/12/2024   INFLUENZA, HIGH DOSE SEASONAL PF 04/12/2017, 03/24/2018, 04/13/2019, 03/27/2021   Influenza  Split 04/12/2013, 04/12/2014, 03/14/2015, 04/23/2016   Influenza, Quadrivalent, Recombinant, Inj, Pf 03/27/2021, 04/16/2023   Influenza-Unspecified 04/03/2013, 04/22/2017   Moderna Sars-Covid-2 Vaccination 08/25/2019, 09/22/2019, 03/07/2020   PNEUMOCOCCAL CONJUGATE-20 10/01/2022   Pfizer Covid-19 Vaccine Bivalent Booster 47yrs & up 05/16/2021   Pneumococcal Conjugate-13 04/03/2013, 09/28/2014   Pneumococcal Polysaccharide-23 07/13/2012, 07/24/2013   Respiratory Syncytial Virus Vaccine,Recomb Aduvanted(Arexvy) 04/12/2022   Td 01/11/2018   Tdap 12/10/2017     Objective: Vital Signs: BP 120/65 (BP Location: Right Arm, Patient Position: Sitting, Cuff Size: Small)   Pulse 73   Temp (!) 95.5 F (35.3 C)   Resp 18   Ht 5' 4.5 (1.638 m)   Wt 125 lb 6.4 oz (56.9 kg)   BMI 21.19 kg/m    Physical Exam Eyes:     Conjunctiva/sclera: Conjunctivae normal.  Cardiovascular:     Rate and Rhythm: Normal rate and  regular rhythm.  Pulmonary:     Effort: Pulmonary effort is normal.     Breath sounds: Normal breath sounds.  Musculoskeletal:     Right lower leg: No edema.     Left lower leg: No edema.  Lymphadenopathy:     Cervical: No cervical adenopathy.  Skin:    General: Skin is warm and dry.     Comments: Blanching purple feet Right shin hyperpigmentation  Neurological:     Mental Status: She is alert.  Psychiatric:        Mood and Affect: Mood normal.      Musculoskeletal Exam:  Shoulders painful with movement, no palpable swelling Elbows full ROM no tenderness or swelling Right wrist pain worse flexion Fingers swan neck 2nd-3rd digits, right 1st IP destroyed, 3rd PIP swelling No paraspinal tenderness to palpation over upper and lower back Hip normal internal and external rotation without pain, no tenderness to lateral hip palpation Knees full ROM no tenderness or swelling, b/l crepitus Ankles full ROM no tenderness or swelling MTPs no swelling, hammertoes   Investigation: No additional findings.  Imaging: No results found.  Recent Labs: Lab Results  Component Value Date   WBC 10.7 06/19/2024   HGB 11.2 (L) 06/19/2024   PLT 317 06/19/2024   NA 140 06/19/2024   K 5.1 06/19/2024   CL 98 06/19/2024   CO2 34 (H) 06/19/2024   GLUCOSE 99 06/19/2024   BUN 15 06/19/2024   CREATININE 0.89 06/19/2024   BILITOT 0.3 06/19/2024   ALKPHOS 45 03/27/2024   AST 18 06/19/2024   ALT 9 06/19/2024   PROT 6.9 06/19/2024   ALBUMIN 3.7 03/27/2024   CALCIUM  9.3 06/19/2024   GFRAA >60 02/15/2020   QFTBGOLDPLUS NEGATIVE 06/19/2024    Speciality Comments: No specialty comments available.  Procedures:  No procedures performed Allergies: Penicillins and Remicade [infliximab]   Assessment / Plan:     Visit Diagnoses:  Assessment & Plan Rheumatoid arthritis involving multiple sites with positive rheumatoid factor (HCC) Currently in exacerbation of symptoms, not having a ton of acute  synovitis on exam with extensive chronic damage so plan to get labs assessing for activity. Off Rituxan  for 1.5 years, leading to increased symptoms. Previous Rituxan  treatment was effective. Plan to restart Rituxan  with initial dosing schedule. Discussed potential side effects and advised completing vaccinations before restarting. Would also be a good candidate for Actemra but preferring previous treatment due to such great response previously. - Ordered blood tests to assess inflammation markers ESr/CRP - Increased steroid dosing to 8 mg daily medrol  temporarily to manage symptoms until Rituxan  infusions can  be scheduled. - Plan to restart rituximab  infusion with 1000 x 2 doses RA schedule  Orders:   Sedimentation rate   C-reactive protein  ILD (interstitial lung disease) (HCC) Follows with Dr. Geronimo for maintenance currently on Ofev  for antifibrotic as well as inhaler treatment and we will be resuming immunosuppressive treatment as above.  Previously had progressive change as compared with 2022 2023 and 2024 although interval between spring and fall of this year showed mild improvement.    Osteopenia, unspecified location Previously on Reclast  infusions which apparently gave her some type of reaction.  Multiple risk factors with falls, seropositive erosive rheumatoid arthritis, long-term use of steroids, and age.    Multiple falls  Plans to resume physical therapy at [facility]. Discussed importance of maintaining muscle strength to prevent worsening of arthritis symptoms. - Plan to contact [facility] to indicate her need for physical therapy. - Encouraged continuation of physical therapy to improve mobility and prevent falls.     High risk medication use Discussed risk of long-term steroid use as well as Rituxan  infusion including infections, cytopenias, infusion reactions.  Discussed impact of Rituxan  on vaccination with decreased efficacy and ideal timing to be 6 months after last  dose if needed. -Checking baseline labs CBC CMP TB screening and quantitative immunoglobulins for Rituxan  resume Orders:   CBC with Differential/Platelet   Comprehensive metabolic panel with GFR   QuantiFERON-TB Gold Plus   IgG, IgA, IgM     Follow-Up Instructions: Return in about 2 months (around 08/20/2024) for New pt RA/ILD RTX restart f/u 2mos.   Stacey LELON Ester, MD  Note - This record has been created using [employer].  Chart creation errors have been sought, but may not always  have been located. Such creation errors do not reflect on  the standard of medical care. "

## 2024-06-20 ENCOUNTER — Ambulatory Visit: Payer: Self-pay | Admitting: Internal Medicine

## 2024-06-20 NOTE — Progress Notes (Signed)
 Sed rate and CRP are very elevated at 72 and 68.7. This indicates some active inflammation so I recommend getting restarted on rituximab  treatment as we discussed. Blood count and metabolic panel are normal.

## 2024-06-21 ENCOUNTER — Encounter: Payer: Self-pay | Admitting: Internal Medicine

## 2024-06-21 ENCOUNTER — Other Ambulatory Visit: Payer: Self-pay

## 2024-06-21 ENCOUNTER — Telehealth: Payer: Self-pay | Admitting: Internal Medicine

## 2024-06-21 ENCOUNTER — Ambulatory Visit: Admitting: Internal Medicine

## 2024-06-21 VITALS — BP 118/70 | HR 74 | Temp 97.6°F | Ht 65.0 in | Wt 125.2 lb

## 2024-06-21 DIAGNOSIS — J4540 Moderate persistent asthma, uncomplicated: Secondary | ICD-10-CM | POA: Diagnosis not present

## 2024-06-21 DIAGNOSIS — Z7184 Encounter for health counseling related to travel: Secondary | ICD-10-CM | POA: Diagnosis not present

## 2024-06-21 DIAGNOSIS — M359 Systemic involvement of connective tissue, unspecified: Secondary | ICD-10-CM | POA: Diagnosis not present

## 2024-06-21 DIAGNOSIS — J8489 Other specified interstitial pulmonary diseases: Secondary | ICD-10-CM | POA: Diagnosis not present

## 2024-06-21 DIAGNOSIS — J8283 Eosinophilic asthma: Secondary | ICD-10-CM | POA: Diagnosis not present

## 2024-06-21 DIAGNOSIS — M069 Rheumatoid arthritis, unspecified: Secondary | ICD-10-CM | POA: Diagnosis not present

## 2024-06-21 DIAGNOSIS — J849 Interstitial pulmonary disease, unspecified: Secondary | ICD-10-CM

## 2024-06-21 LAB — COMPREHENSIVE METABOLIC PANEL WITH GFR
AG Ratio: 1.2 (calc) (ref 1.0–2.5)
ALT: 9 U/L (ref 6–29)
AST: 18 U/L (ref 10–35)
Albumin: 3.7 g/dL (ref 3.6–5.1)
Alkaline phosphatase (APISO): 52 U/L (ref 37–153)
BUN: 15 mg/dL (ref 7–25)
CO2: 34 mmol/L — ABNORMAL HIGH (ref 20–32)
Calcium: 9.3 mg/dL (ref 8.6–10.4)
Chloride: 98 mmol/L (ref 98–110)
Creat: 0.89 mg/dL (ref 0.60–0.95)
Globulin: 3.2 g/dL (ref 1.9–3.7)
Glucose, Bld: 99 mg/dL (ref 65–99)
Potassium: 5.1 mmol/L (ref 3.5–5.3)
Sodium: 140 mmol/L (ref 135–146)
Total Bilirubin: 0.3 mg/dL (ref 0.2–1.2)
Total Protein: 6.9 g/dL (ref 6.1–8.1)
eGFR: 64 mL/min/1.73m2 (ref >=60)

## 2024-06-21 LAB — CBC WITH DIFFERENTIAL/PLATELET
Absolute Lymphocytes: 2900 {cells}/uL (ref 850–3900)
Absolute Monocytes: 1445 {cells}/uL — ABNORMAL HIGH (ref 200–950)
Basophils Absolute: 11 {cells}/uL (ref 0–200)
Basophils Relative: 0.1 %
Eosinophils Absolute: 0 {cells}/uL — ABNORMAL LOW (ref 15–500)
Eosinophils Relative: 0 %
HCT: 35.2 % — ABNORMAL LOW (ref 35.9–46.0)
Hemoglobin: 11.2 g/dL — ABNORMAL LOW (ref 11.7–15.5)
MCH: 28.9 pg (ref 27.0–33.0)
MCHC: 31.8 g/dL (ref 31.6–35.4)
MCV: 90.7 fL (ref 81.4–101.7)
MPV: 10.9 fL (ref 7.5–12.5)
Monocytes Relative: 13.5 %
Neutro Abs: 6345 {cells}/uL (ref 1500–7800)
Neutrophils Relative %: 59.3 %
Platelets: 317 Thousand/uL (ref 140–400)
RBC: 3.88 Million/uL (ref 3.80–5.10)
RDW: 12.9 % (ref 11.0–15.0)
Total Lymphocyte: 27.1 %
WBC: 10.7 Thousand/uL (ref 3.8–10.8)

## 2024-06-21 LAB — QUANTIFERON-TB GOLD PLUS
Mitogen-NIL: 9.77 [IU]/mL
NIL: 0.01 [IU]/mL
QuantiFERON-TB Gold Plus: NEGATIVE
TB1-NIL: 0.01 [IU]/mL
TB2-NIL: 0 [IU]/mL

## 2024-06-21 LAB — IGG, IGA, IGM
IgG (Immunoglobin G), Serum: 1371 mg/dL (ref 600–1540)
IgM, Serum: 102 mg/dL (ref 50–300)
Immunoglobulin A: 331 mg/dL — ABNORMAL HIGH (ref 70–320)

## 2024-06-21 LAB — SEDIMENTATION RATE: Sed Rate: 72 mm/h — ABNORMAL HIGH (ref 0–30)

## 2024-06-21 LAB — C-REACTIVE PROTEIN: CRP: 68.7 mg/L — ABNORMAL HIGH (ref <8.0)

## 2024-06-21 NOTE — Progress Notes (Signed)
 OV 02/11/2024  Subjective:  Patient ID: Stacey [name] Alert, female , DOB: 04/10/1941 , age 83 y.o. , MRN: [id number] , ADDRESS: [address] [place] [address] PCP Onita Rush, MD Patient Care Team: Onita Rush, MD as PCP - General (Internal Medicine) Lonni Slain, MD as PCP - Cardiology (Cardiology)  This Provider for this visit: Treatment Team:  Attending Provider: Geronimo Amel, MD     02/11/2024 -   Chief Complaint  Patient presents with   Follow-up    ILD Pt states she is doing good     HPI Stacey [name] 83 y.o. - returns for follow-up.  Since the last visit Interim Health status: No new complaints No new medical problems. No new surgeries. No ER visits. No Urgent care visits. No changes to medications.  She did visit Dr. Lenon at [facility] in July 2025 notes reviewed.  She is aligned with the notes.  They took a shared decision making not to restart Rituxan  because of excellent or a joint control.  Today she is here to review her respiratory status  - ILD: This is stable.  Tolerating nintedanib  well.  Nintedanib  was low-dose.  Symptoms are stable.  Symptom score is stable.  She did pulmonary function test and shows an improvement compared to May 2025 but overall course is 1 of progressive phenotype even compared to 1 year ago.  I did share this with her.  She wants to remain on nintedanib .  She does not want to add research protocol with the inhaled treprostinil versus placebo because of inconvenience.  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  She is going to reflect on this.  We took a shared decision making to see her in 3 months with another PFT and if shows continued progression then by the time of December 2025 visit with rheumatology will take a decision about adding  either Rituxan  or tocilizumab.  I personally favor the latter because of convenience.  - Asthma with recurrent exacerbations: Repeated blood eosinophil and it is high.  She will continue Breo we talked about Fasenra  and biologic therapy for asthma.  She is interested.  I think this is a good idea to improve cough and diminish exacerbations.  - Grade 2 diastolic dysfunction and edema: The edema is further improved.  BNP test again in June 2025 was normal.  There was no evidence of pulmonary hypertension on the echo.  Will continue to monitor this. .   OV 06/21/2024  Subjective:  Patient ID: Stacey [name] Alert, female , DOB: [birth year] , age 83 y.o. , MRN: [id number] , ADDRESS: [address] [place] [address] PCP Onita Rush, MD Patient Care Team: Onita Rush, MD as PCP - General (Internal Medicine) Lonni Slain, MD as PCP - Cardiology (Cardiology)  This Provider for this visit: Treatment Team:  Attending Provider: Geronimo Amel, MD   Rheumatoid arthritis with Joint s/ILD- Rituxan kathie festus lusher)  - progressive phenotype (CT 2017/2019 -> Oct 2020). Planned Ofev  start in Nov 2020 but did not start as of dec 2020. LFT normal Jan 2021   - Rituxan  via [facility] -September 2021 + Medrol  4mg /2mg  QOD baseline many decades (on 8mg  per day since feb 2021 due to recurrent asthma flares)   -> ON HOLD since summer/spring 2024   - NOt recommended  for joints following [facility] visit July 2025 (due to good RA joint control)   - nintedanib  for her ILD she started it on July 31, 2019 - started 100mg  twice daily (started low dose du eto side effect concern) -> increased to 150mg  bid d June 2022 -> reduced to 100mg  bid aug 2022 due tow eight loss  - ILD PRO registry study - since 08/16/19  - LAST HRCT APRIL 2025  Athma/obstructive lung disease phenotype on Dulera /Singulair   - Normal allergy  panel and igE but his EOS  Post herpetic neuralgia - on  elavil , gabapentin , effexor   Coronary Artery Calcification - 1 vessel -   ADMITS - S/p - covid-19 on 05/31/2019. Admitted 06/11/2019  - 06/16/2019. Rx with Remdesviri and Decadron .   - sp evushed x 2  - Admitted August 2021 for respiratory infection not otherwise specified.  Following trip to [place] .  Was hypoxemic.  - Status post  influenza hospitalization for mild respiratory failure February 2025.  -   Small Hiatal hernia on CT Oct 2020  Rib fracture from fall Dec 2020: Musculoskeletal: There is nondisplaced fractures of the posterior left fifth, 6, 8, 9, and tenth left ribs. There is unchanged slight superior compression deformities of the T5, T6, T10 vertebral bodies with less than 25% loss in height.Associated pulmnary contusion +  -On Reclast  once a year through Dr. Delon Gosling  ECHO FEb 2-025 - Gr2 DDx (in 2022 - Gr1 ddx)  -   Chronic sterile Medrol  4 mg with alternating 2 mg.   Nintedanib /Ofev  requires intensive drug monitoring due to high concerns for Adverse effects of , including  Drug Induced Liver Injury, significant GI side effects that include but not limited to Diarrhea, Nausea, Vomiting,  and other system side effects that include Fatigue,  weight loss. Cardiac side effects are a black box warning as well. These will be monitored with  blood work such as LFT initially once a month for 6 months and then quarterly  06/21/2024 -   Chief Complaint  Patient presents with   Interstitial Lung Disease    Pt states since LOV breathing has been okay SOB occurs w/ any activity Dry cough occurs     HPI Stacey [name] Alert 83 y.o. - Stacey [name] is an 83 year old female with rheumatoid arthritis  with ILD who presents with a flare of symptoms. RHonda her friend with her and is indy historian  She is experiencing a flare of her rheumatoid arthritis symptoms, characterized by an inability to move her hand and wrist. This flare began approximately one week ago. She  has not received treatment for her rheumatoid arthritis in a year and a half. She started taking prednisone  at a consistent 8 mg daily this morning, increased from an alternating 4 mg and 2 mg dosage. She plans to travel to [place] next week to visit her daughter for Christmas. Her son-in-law will accompany her on the trip, and her daughter will fly back with her. She is concerned about her ability to enjoy the trip due to her current symptoms.  She reports that her breathing is about the same and that her leg swelling has improved, although her ankles remain slightly swollen. She recalls being hospitalized for respiratory issues about a year ago.  She mentions having taken one shot of Fasenra  for wheezing and cough but did not receive further communication regarding this treatment. She is unsure if it was effective. No new or worsening breathing difficulties.  SYMPTOM SCALE - ILD 03/27/2019  05/24/2019 (2-3 weeks    .mrmonpre cpovid)  08/03/2019 Post covid Post fall rib # 10/22/2020 136# Now doing rehab 12/23/2020 136# 03/10/2021 131# Ofev  150mg  bid 04/08/2021 132# Ofev  100mg  bid 07/18/2021 133# ofev  100 x 2 03/06/2022 132#, ofev  100x2 12/11/2022 128# 03/02/2023  09/09/2023 POST ADMIT 11/12/2023  01/03/2024 125# 02/11/2024 124# 06/21/2024   O2 use ra    ra ra ra ra ra ra ra ra ra  O2 at night ?   Shortness of Breath 0 -> 5 scale with 5 being worst (score 6 If unable to do)                 At rest 0 2 3 3  0 3 1 2 1  4 3 1   0 2  Simple tasks - showers, clothes change, eating, shaving 0 3 2 3 2 3 1 3 3  3 3 2  2 3   Household (dishes, doing bed, laundry) 2 4 5 3 3 4 3 3 4  4 5 2  3 4   Shopping 2 3 6 4 3 4 4 4 4  4 5 2  3 5   Walking level at own pace 2 3 4 4 2 3 3 3 4  4 5 3  3 4   Walking up Stairs 3 3 5 4 3 4 4 4 5  4 5 4  4 5   Total (40 - 48) Dyspnea Score 9 17 25 21 13 21 16 19 21  23 26 14  15 23   How bad is your cough? 2 2 3  0 0 1 0 0 0  3 1 2  1 3   How bad is your fatigue 3 3.5 5  3 2 3 3 4 4  3 5 3  2 5   nause    0 1 2.5 1 1 1   0 0 1  1 0  vomit    0 1 2.4 1 1 1   0 0 1  0 0  diarrhea    0 0 1 0 0 0  0 3 0  0 00  anxiety    1 0 1 0 0 1  0 0 0  00 1  depression    0 0 0 0 0 0  0 0 0  0 0  0        SIT STAND TEST - goal 15 times   01/03/2024  06/21/2024   O2 used ra ra  PRobe - finter or forehead forehead finer  Number sit and stand completed - goal 15 15 15   Time taken to complete 1 min and 1 second using WALKER 39 sec and did NOT use her walker  Resting Pulse Ox/HR/Dyspnea  95% and 74/min and dyspnea of 1/10  95% and HR 75 and score 4  Peak measures 96 % and 84/min and dyspnea of 8/10 93% and HR 82 and score 7  Final Pulse Ox/HR 93% and 84/min and dyspnea of 5/10 90% and HR 80 and score 5  Desaturated </= 88% no   Desaturated <= 3% points no   Got Tachycardic >/= 90/min no   Miscellaneous comments Got dyspenic. Needed waer Got dyspneic but stable    PFT     Latest Ref Rng & Units 02/11/2024    8:20 AM 11/12/2023    8:20 AM 03/01/2023   11:45 AM 03/06/2022    3:01 PM 07/02/2021    3:07 PM 03/07/2021  11:05 AM 12/23/2020    1:03 PM  PFT Results  FVC-Pre L 1.57  1.47  1.81  1.62  1.69  1.78  1.66   FVC-Predicted Pre % 61  56  69  61  62  66  60   Pre FEV1/FVC % % 74  67  76  74  73  72  71   FEV1-Pre L 1.16  0.99  1.39  1.20  1.23  1.28  1.17   FEV1-Predicted Pre % 61  51  71  61  61  64  57   DLCO uncorrected ml/min/mmHg 9.17  8.70  11.53  12.57  15.35  12.94  14.98   DLCO UNC% % 47  45  59  65  79  66  77   DLCO corrected ml/min/mmHg 9.87   11.53  12.57  15.35  12.94  14.98   DLCO COR %Predicted % 51   59  65  79  66  77   DLVA Predicted % 71  67  78  84  100  84  86        LAB RESULTS last 96 hours No results found.       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   reports that she has never smoked. She has been exposed to tobacco smoke. She has never used smokeless tobacco.  Past Surgical History:  Procedure Laterality Date   CHOLECYSTECTOMY  07/13/2009   IR ANGIO INTRA EXTRACRAN SEL COM CAROTID INNOMINATE BILAT MOD SED  06/18/2020   IR ANGIO VERTEBRAL SEL SUBCLAVIAN INNOMINATE UNI R MOD SED  06/18/2020   IR ANGIO VERTEBRAL SEL VERTEBRAL UNI L MOD SED  06/18/2020   WISDOM TOOTH EXTRACTION      Allergies  Allergen Reactions   Penicillins Other (See Comments)    Unknown reaction  Did it involve swelling of the face/tongue/throat, SOB, or low BP? Unknown Did it involve sudden or severe rash/hives, skin peeling, or any reaction on the inside of your mouth or nose? Unknown Did you need to seek medical attention at a hospital or doctor's office? No When did it last happen?     childhood  If all above answers are NO, may proceed with cephalosporin use.     Remicade [Infliximab] Other (See Comments)    Unknown reaction    Immunization History  Administered Date(s) Administered   Fluad Quad(high Dose 65+) 04/23/2020, 03/23/2022, 05/12/2024   INFLUENZA, HIGH DOSE SEASONAL PF 04/12/2017, 03/24/2018, 04/13/2019, 03/27/2021   Influenza Split 04/12/2013, 04/12/2014, 03/14/2015, 04/23/2016   Influenza, Quadrivalent, Recombinant, Inj, Pf 03/27/2021, 04/16/2023   Influenza-Unspecified 04/03/2013, 04/22/2017   Moderna Sars-Covid-2 Vaccination 08/25/2019, 09/22/2019, 03/07/2020   PNEUMOCOCCAL CONJUGATE-20 10/01/2022   Pfizer Covid-19 Vaccine Bivalent Booster 34yrs & up 05/16/2021   Pneumococcal Conjugate-13 04/03/2013, 09/28/2014   Pneumococcal Polysaccharide-23 07/13/2012, 07/24/2013   Respiratory Syncytial Virus Vaccine,Recomb Aduvanted(Arexvy) 04/12/2022   Td 01/11/2018   Tdap 12/10/2017     Family History  Problem Relation Age of Onset   Asthma Mother    Anemia Mother    Polymyalgia rheumatica Mother    COPD Father    Pulmonary fibrosis Father    Seizures Sister    Breast cancer Maternal Grandmother 86   Healthy Daughter  Healthy Son      Current Outpatient Medications:    amitriptyline  (ELAVIL ) 25 MG tablet, Take 1 tablet (25 mg total) by mouth at bedtime., Disp: 30 tablet, Rfl: 1   BREO ELLIPTA  100-25 MCG/ACT AEPB, Inhale 1 puff into the lungs daily., Disp: , Rfl:    cholecalciferol  (VITAMIN D3) 25 MCG (1000 UNIT) tablet, Take 1,000 Units by mouth every evening., Disp: , Rfl:    gabapentin  (NEURONTIN ) 300 MG capsule, Take 600 mg by mouth at bedtime., Disp: , Rfl:    losartan  (COZAAR ) 25 MG tablet, Take 12.5 mg by mouth daily., Disp: , Rfl:    methylPREDNISolone  (MEDROL ) 8 MG tablet, Take 1 tablet (8 mg total) by mouth daily. In place of regular maintenance 4 mg tablets, Disp: 30 tablet, Rfl: 0   Nintedanib  (OFEV ) 100 MG CAPS, Take 1 capsule (100 mg total) by mouth 2 (two) times daily., Disp: 60 capsule, Rfl: 5   ondansetron  (ZOFRAN ) 4 MG tablet, TAKE ONE TABLET BY MOUTH EVERY 8 HOURS AS NEEDED FOR NAUSEA AND VOMITING, Disp: 30 tablet, Rfl: 5   pantoprazole  (PROTONIX ) 40 MG tablet, Take 40 mg by mouth daily., Disp: , Rfl:    polyethylene glycol (MIRALAX  / GLYCOLAX ) 17 g packet, Take 17 g by mouth daily as needed for mild constipation., Disp: 14 each, Rfl: 0   pravastatin  (PRAVACHOL ) 20 MG tablet, TAKE ONE TABLET BY MOUTH DAILY, Disp: 30 tablet, Rfl: 0   venlafaxine  XR (EFFEXOR -XR) 75 MG 24 hr capsule, Take 75 mg by mouth daily., Disp: , Rfl:    acetaminophen  (TYLENOL ) 500 MG tablet, Take 1,000 mg by mouth every 6 (six) hours as needed for moderate pain (pain score 4-6)., Disp: , Rfl:    benralizumab  (FASENRA  PEN) 30 MG/ML prefilled autoinjector, Inject 30mg  (contents of one pen) in the skin at Week 4 (04/24/24) and Week 8 (05/22/24), then every 8 weeks thereafter.  (Patient not taking: Reported on 06/21/2024), Disp: 2 mL, Rfl: 0   methylPREDNISolone  (MEDROL ) 2 MG tablet, Take 2 mg by mouth daily. 2 mg alternating with 4 mg daily (Patient not taking: Reported on 06/21/2024), Disp: , Rfl:    methylPREDNISolone  (MEDROL ) 4 MG tablet, Take 4 mg by mouth. 4mg  one day and 2mg  the next day (Patient not taking: Reported on 06/21/2024), Disp: , Rfl:       Objective:   Vitals:   06/21/24 0913  BP: 118/70  Pulse: 74  Temp: 97.6 F (36.4 C)  TempSrc: Oral  SpO2: 94%  Weight: 125 lb 3.2 oz (56.8 kg)  Height: 5' 5 (1.651 m)    Estimated body mass index is 20.83 kg/m as calculated from the following:   Height as of this encounter: 5' 5 (1.651 m).   Weight as of this encounter: 125 lb 3.2 oz (56.8 kg).  @WEIGHTCHANGE @  Filed Weights   06/21/24 0913  Weight: 125 lb 3.2 oz (56.8 kg)     Physical Exam  2 General: No distress. Looks well O2 at rest: no Cane present: no Sitting in wheel chair: no but has WALKER Frail: YES Obese: no Neuro: Alert and Oriented x 3. GCS 15. Speech normal Psych: Pleasant Resp:  Barrel Chest - no.  Wheeze - no, Crackles - YES UL and LL, No overt respiratory distress CVS: Normal heart sounds. Murmurs - no Ext: Stigmata of Connective Tissue Disease - RA FLAREING UP RIGHT HAND HEENT: Normal upper airway. PEERL +. No post nasal drip        Assessment/  Assessment & Plan Interstitial lung disease due to connective tissue disease (HCC)  Moderate persistent asthma without complication  Eosinophilic asthma  Rheumatoid arthritis involving multiple sites, unspecified whether rheumatoid factor present [facility])  Travel advice encounter    PLAN Patient Instructions  ILD due to RA Encounter for therapeutic monitoring   - Lung function is improved in August 2025 compared to May 2025 but overall compared to 2022, 2023 and summer 2024 it is progressive -Glad doing well tolerating the nintedanib  well with Zofran   and not having any nausea vomiting   Plan -  Continue nintedanib  low-dose protocol  - Normal liver function test dec 2025 will repeat in 3 months at follow-up -Continue oxygen  at night and with heavy exertion keeping pulse ox of 88%  = You do not need oxygen  at rest - -do spiro and dlco in 12 weeks  - at follow up discuss potential for other treatments depengin on new approval and stability  v progression    History of rheumatoid arthritis Travel to [facility] in [place] 2025  -  - noted nearly  > 1.5 year since last Riuxan  - Glad you saw Dr. Lenon rheumatology on  01/21/2024 took a shared decision making not to reinitiate Rituxan  - you saw Dr Jeannetta locally 06/19/24 and RA is aCTIVE NOW and plans to restart Rituxan    Plan - continue daily medrol  long term  - will email DR Rice to know you want more steroids to help your travel -Rituxan  Jan 2026 per Dr Jeannetta   PEDAL EDEMA  ECHO - no pulmonary hypertensio n Feb 2025 but has GRad 2 Diast Dysfunction BNP - normal FEb 2024 and June 2025  -Significantly improved on this visit 11/12/2023 and 01/03/2024 and 02/11/2024.   Plan According to primary care physician  Wheezing Hz of asthma - normal allergy  test in 2019 but EOS + Frequent exacerbation  - noted after 1 dose fasenra  not getting fasenra   Plan  -  -Continue Breo - [employer] - messaged pharmacy team  Falls Frequent Frailty Travel advice  -The fall on 10/06/2023 is unrelated to her lung disease or oxygen  use - On 11/12/2023 1-2 more falls since visit end march 2025 - No falls since last visit in May 2025.  - great news  Plan - use walker  - continue PT -travel carefully wit lot of assist    Followup -12 weeks- 30 min visit with Dr Barnett  -  Symptom score and exercise hypoxemia test  - PFT at followup-     FOLLOWUP    Return for -12 weeks- 30 min visit with Dr Barnett.    SIGNATURE    Dr. Dorethia Cave, M.D., F.C.C.P,  Pulmonary and  Critical Care Medicine Staff Physician, [facility] Center Director - Interstitial Lung Disease  Program  Pulmonary Fibrosis [facility] at [facility] [place], [place], [address]  Pager: [phone], If no answer or between  15:00h - 7:00h: call [phone] Telephone: [phone]  9:51 AM 06/21/2024       HIGh Complexity  OFFICE   2021 E/M guidelines, first released in 2021, with minor revisions added in 2023. Must meet the requirements for 2 out of 3 dimensions to qualify.    Number and complexity of problems addressed Amount and/or complexity of data reviewed Risk of complications and/or morbidity  Severe exacerbation of chronic illness  Acute or chronic illnesses that may pose a threat to life  or bodily function, e.g., multiple trauma, acute MI, pulmonary embolus, severe respiratory distress, progressive rheumatoid arthritis, psychiatric illness with potential threat to self or others, peritonitis, acute renal failure, abrupt change in neurological status Must meet the requirements for 2 of 3 of the categories)  Category 1: Tests and documents, historian  Any combination of 3 of the following:  Assessment requiring an independent historian - RHonda friend  Review of prior external note(s) from each unique source  Review of results of each unique test  Ordering of each unique test    Category 2: Interpretation of tests    Independent interpretation of a test performed by another physician/other qualified health care professional (not separately reported)  Category 3: Discuss management/tests  Discussion of management or test interpretation with external physician/other qualified health care professional/appropriate source (not separately reported) =- Dr RICE  HIGH risk of morbidity from additional diagnostic testing or treatment Examples only:  Drug therapy requiring intensive monitoring for toxicity  Decision for elective  major surgery with identified pateint or procedure risk factors  Decision regarding hospitalization or escalation of level of care  Decision for DNR or to de-escalate care   Parenteral controlled  substances

## 2024-06-21 NOTE — Telephone Encounter (Signed)
 Hi [name]  In all the years of note [name] this is the worst of seeing her right wrist.  I believe this is because of the reduction wearing off because it has been 18 months and the last dose   She tells me that she needs to travel to [place] over Christmas 2025.  The flight is next week.  She wants to get more Medrol  than what you have done.  She is increased starting today to 8 mg/day but she believes she needs more  Plan/discussion - Support Rituxan  (although wondering if tocilizumab is an option] -we will leave the choice to you and her but I did mention this to her at last visit  - For now she wants more Medrol  and I am passing the message to you and is the primary purpose of the message   Thanks    SIGNATURE    Dr. Dorethia Cave, M.D., F.C.C.P,  Pulmonary and Critical Care Medicine Staff Physician, [facility] Center Director - Interstitial Lung Disease  Program  Pulmonary Fibrosis [facility] at [facility] [place], [place], [address]   Pager: [phone], If no answer  -> Check AMION or Try [phone] Telephone (clinical office): [phone] Telephone (research): [phone]  9:47 AM 06/21/2024

## 2024-06-21 NOTE — Patient Instructions (Addendum)
 ILD due to RA Encounter for therapeutic monitoring   - Lung function is improved in August 2025 compared to May 2025 but overall compared to 2022, 2023 and summer 2024 it is progressive -Glad doing well tolerating the nintedanib  well with Zofran  and not having any nausea vomiting   Plan -  Continue nintedanib  low-dose protocol  - Normal liver function test dec 2025 will repeat in 3 months at follow-up -Continue oxygen  at night and with heavy exertion keeping pulse ox of 88%  = You do not need oxygen  at rest - -do spiro and dlco in 12 weeks  - at follow up discuss potential for other treatments depengin on new approval and stability  v progression    History of rheumatoid arthritis Travel to [facility] in [place] 2025  -  - noted nearly  > 1.5 year since last Riuxan  - Glad you saw Dr. Lenon rheumatology on  01/21/2024 took a shared decision making not to reinitiate Rituxan  - you saw Dr Jeannetta locally 06/19/24 and RA is aCTIVE NOW and plans to restart Rituxan    Plan - continue daily medrol  long term  - will email DR Rice to know you want more steroids to help your travel -Rituxan  Jan 2026 per Dr Jeannetta   PEDAL EDEMA  ECHO - no pulmonary hypertensio n Feb 2025 but has GRad 2 Diast Dysfunction BNP - normal FEb 2024 and June 2025  -Significantly improved on this visit 11/12/2023 and 01/03/2024 and 02/11/2024.   Plan According to primary care physician  Wheezing Hz of asthma - normal allergy  test in 2019 but EOS + Frequent exacerbation  - noted after 1 dose fasenra  not getting fasenra   Plan  -  -Continue Breo - [employer] - messaged pharmacy team  Falls Frequent Frailty Travel advice  -The fall on 10/06/2023 is unrelated to her lung disease or oxygen  use - On 11/12/2023 1-2 more falls since visit end march 2025 - No falls since last visit in May 2025.  - great news  Plan - use walker  - continue PT -travel carefully wit lot of assist    Followup -12 weeks- 30 min  visit with Dr Barnett  -  Symptom score and exercise hypoxemia test  - PFT at followup-

## 2024-06-22 ENCOUNTER — Telehealth: Payer: Self-pay

## 2024-06-22 DIAGNOSIS — M0579 Rheumatoid arthritis with rheumatoid factor of multiple sites without organ or systems involvement: Secondary | ICD-10-CM

## 2024-06-22 NOTE — Telephone Encounter (Signed)
 Patient called the office an left a message stating she is in severe pain and has a trip coming up on Sunday an will be out of town. She would like to know if she could get a stronger dose of prednisone  sent to the pharmacy. Please advise

## 2024-06-22 NOTE — Telephone Encounter (Signed)
 Copied from CRM #[id number]. Topic: Clinical - Medical Advice >> Jun 22, 2024 10:34 AM Corean SAUNDERS wrote: Reason for CRM: Patients daughter Therisa Blade is requesting a call back from Dr. Fernande nurse Rosina as she would like to follow up on the prednisone  conversation they had as patient is still in severe pain from rheumatoid arthritis,  Pt was seen yesterday by Dr Geronimo. Pt states her rheumatologist put her on 8mg  of Prednisone  on Monday from Dr Medford Ester. Pt states she would like to get a rx for more prednisone  because she is traveling next Wednesday and will be gone for almost 2 weeks. I informed pt that she would ned to contact Dr Ester office to see if he will rx a little more prednisone  to take on th trip as Dr Bubba can not refill a rx he did not prescribe. Pt verbalized understanding. NFN

## 2024-06-23 MED ORDER — METHYLPREDNISOLONE 8 MG PO TABS: ORAL_TABLET | ORAL | 0 refills | Status: DC

## 2024-06-23 NOTE — Telephone Encounter (Signed)
 Patient's daughter called on behalf of the patient and left a voicemail. Patient was seen on 06/19/2024. Patient was given a prescription for Prednisone . Patient is in a lot of pain and would like to know if it can be increased. Patient's daughter Therisa Brunswick can be reached at [phone].

## 2024-06-23 NOTE — Telephone Encounter (Signed)
 Patient advised Dr. Jeannetta got a message corresponding with Dr. Geronimo concerned about continued inflammation as well. She can try increasing the dose to 16 mg (2 tablets) for at least 2 weeks and see if this works better and try going back to 1 (8 mg) after that if symptoms are doing better. Dr. Jeannetta sent a new prescription reflecting this higher dosing so there are enough tablets to not run out after just a few weeks. Patient expressed understanding.

## 2024-06-23 NOTE — Telephone Encounter (Signed)
 Thanks for the update. I have sent her a new prescription. My preference would be for repeating the rituximab  as next step considering her excellent response (12-18 mos maintained low disease off treatment.) But I agree Actemra would be my next choice if we need to change again going forwards. [place]

## 2024-06-23 NOTE — Telephone Encounter (Signed)
 I got a message corresponding with Dr. Geronimo concerned about continued inflammation as well. She can try increasing the dose to 16 mg (2 tablets) for at least 2 weeks and see if this works better and try going back to 1 (8 mg) after that if symptoms are doing better. I sent a new prescription reflecting this higher dosing so there are enough tablets to not run out after just a few weeks.

## 2024-06-23 NOTE — Addendum Note (Signed)
 Addended by: JEANNETTA LONNI ORN on: 06/23/2024 04:08 PM   Modules accepted: Orders

## 2024-06-29 NOTE — Telephone Encounter (Signed)
 Sounds great!

## 2024-07-12 NOTE — Assessment & Plan Note (Signed)
" °  Plans to resume physical therapy at [facility]. Discussed importance of maintaining muscle strength to prevent worsening of arthritis symptoms. - Plan to contact [facility] to indicate her need for physical therapy. - Encouraged continuation of physical therapy to improve mobility and prevent falls.     "

## 2024-07-12 NOTE — Assessment & Plan Note (Signed)
 Previously on Reclast  infusions which apparently gave her some type of reaction.  Multiple risk factors with falls, seropositive erosive rheumatoid arthritis, long-term use of steroids, and age.

## 2024-07-12 NOTE — Assessment & Plan Note (Signed)
 Follows with Dr. Geronimo for maintenance currently on Ofev  for antifibrotic as well as inhaler treatment and we will be resuming immunosuppressive treatment as above.  Previously had progressive change as compared with 2022 2023 and 2024 although interval between spring and fall of this year showed mild improvement.

## 2024-07-12 NOTE — Assessment & Plan Note (Addendum)
 Currently in exacerbation of symptoms, not having a ton of acute synovitis on exam with extensive chronic damage so plan to get labs assessing for activity. Off Rituxan  for 1.5 years, leading to increased symptoms. Previous Rituxan  treatment was effective. Plan to restart Rituxan  with initial dosing schedule. Discussed potential side effects and advised completing vaccinations before restarting. Would also be a good candidate for Actemra but preferring previous treatment due to such great response previously. - Ordered blood tests to assess inflammation markers ESr/CRP - Increased steroid dosing to 8 mg daily medrol  temporarily to manage symptoms until Rituxan  infusions can be scheduled. - Plan to restart rituximab  infusion with 1000 x 2 doses RA schedule  Orders:   Sedimentation rate   C-reactive protein

## 2024-07-14 ENCOUNTER — Telehealth: Payer: Self-pay | Admitting: Internal Medicine

## 2024-07-14 DIAGNOSIS — M0579 Rheumatoid arthritis with rheumatoid factor of multiple sites without organ or systems involvement: Secondary | ICD-10-CM

## 2024-07-14 NOTE — Telephone Encounter (Signed)
 Pt called stating she would like to restart rituximab  infusions. Pt stated her and Dr. Jeannetta spoke about it at her last appointment.

## 2024-07-17 ENCOUNTER — Telehealth: Payer: Self-pay | Admitting: Internal Medicine

## 2024-07-17 ENCOUNTER — Other Ambulatory Visit: Payer: Self-pay | Admitting: Pharmacist

## 2024-07-17 NOTE — Telephone Encounter (Signed)
 Patient would like a call back about her rituximab  infusions .

## 2024-07-17 NOTE — Telephone Encounter (Signed)
 Patients daughter called the office to see what are the next steps to helping her mom with the pain she is in. She's asking if there is something that can help ease the pain or if she should come in the office to be seen. Patient was recently taking prednisone  and has finished the dose pack and would like to know if increasing the prednisone  would help this until she can get scheduled for the infusion. If a prescription can be sent she would like it to go to [facility].

## 2024-07-17 NOTE — Progress Notes (Signed)
 Referral placed for Rituxan  (rituximab ) IV [phone]) at [facility] [phone]) [place] to start benefits investigation.  Diagnosis: ILD/pulmonary fibrosis and rheumatoid arthritis (RA)  Provider: Dr. Lonni Ester (rheum) and Dr. Dorethia Cave (pulm)  Dose: 1000mg  IV at Day 0 and Day 14. Repeat every 6 months Premedications: acetaminophen  650mg  p.o., diphenhydramine 25mg  p.o., and Solumedrol 100mg  IV Labs to be drawn with infusions: CBC/CMP every 3 months and TB gold yearly  Last dose/infusion date: unknown > 1 year ago Last Clinic Visit: 06/19/2024 Next Clinic Visit: not scheduled  Pertinent baseline labs: TB gold negative on 06/19/2024 ; hepatitis negative on Jan 2020  Once benefits are processed, infusion center will contact patient to schedule.  Sherry Pennant, PharmD, MPH, BCPS, CPP Clinical Pharmacist

## 2024-07-18 ENCOUNTER — Telehealth: Payer: Self-pay | Admitting: Pharmacy Technician

## 2024-07-18 MED ORDER — METHYLPREDNISOLONE 8 MG PO TABS: ORAL_TABLET | ORAL | 0 refills | Status: AC

## 2024-07-18 NOTE — Telephone Encounter (Signed)
 Sorry for the delay in getting her started with rituximab  again. It looks like she is now scheduled for the 15th. Was she seeing more relief with the medrol  taper? I can re-send this prescription today for her in the meantime.

## 2024-07-18 NOTE — Telephone Encounter (Signed)
 Most patients do experience some immediate benefit with the infusion, also related to the accompanying pre-medications given such as solumedrol. The rituximab  itself can start helping as soon as a few weeks to a few months to see maximum result.

## 2024-07-18 NOTE — Telephone Encounter (Signed)
 Contacted the patient to advise that per Dr Jeannetta , he apologizes for the delay in getting her started with rituximab  again. It looks like she is now scheduled for the 15th. Was she seeing more relief with the medrol  taper? I can re-send this prescription today for her in the meantime.  Patient verbalized understanding and wants to know if the infusion will give relief right away as she is experiencing some swelling in the hands and can not get her rings off. She has spoke with the nurse where she lives and they advised it was not cutting the circulation off but patient still can't get the rings off. She does feel relief when she applies ice to the hand but it doesn't come down enough to get the rings off.

## 2024-07-18 NOTE — Addendum Note (Signed)
 Addended by: JEANNETTA LONNI ORN on: 07/18/2024 02:39 PM   Modules accepted: Orders

## 2024-07-18 NOTE — Telephone Encounter (Signed)
 Contacted patient to advise Most patients do experience some immediate benefit with the infusion, also related to the accompanying pre-medications given such as solumedrol. The rituximab  itself can start helping as soon as a few weeks to a few months to see maximum result   Patient verbalized understanding

## 2024-07-18 NOTE — Telephone Encounter (Addendum)
 Devki--Patient will be scheduled ASAP  Auth Submission: APPROVED Site of care: Site of care: [facility] Payer: Medicare A/B, BCBS Supplement  Medication & CPT/J Code(s) submitted: Rituxan  (Rituximab ) [id number] Diagnosis Code: J84.9 Route of submission (phone, fax, portal): n/a Phone # Fax # Auth type: Buy/Bill PB Units/visits requested: 1000 mg q14 days Reference number: n/a Approval from: 07/17/2024 to 12/15/2024

## 2024-07-19 ENCOUNTER — Encounter: Payer: Self-pay | Admitting: Podiatry

## 2024-07-19 ENCOUNTER — Ambulatory Visit: Admitting: Podiatry

## 2024-07-19 VITALS — Ht 65.0 in | Wt 125.2 lb

## 2024-07-19 DIAGNOSIS — D2372 Other benign neoplasm of skin of left lower limb, including hip: Secondary | ICD-10-CM | POA: Diagnosis not present

## 2024-07-19 NOTE — Progress Notes (Signed)
 "  Chief Complaint  Patient presents with   Nail Problem    Pt is here due to possible toenail fungus and she thinks that she has a corn in between the left pinky toe that has been bothering her for a few months.    Subjective: 84 y.o. female presenting to the office today for evaluation of a symptomatic callus to the dorsal medial aspect of the left fifth toe adjacent to fourth digit.  She says that it is very tender and painful and bothering her for the last few months  She also has a long history of yellow discoloration with thickening to the toenails bilateral.  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   Lichen sclerosus 05/26/2006   Asymptomatic   Low iron    Mitral valve prolapse    Osteoarthritis    Osteoporosis    Pneumonia    Post herpetic neuralgia    Rheumatoid arthritis(714.0)    Yeast infection     Past Surgical History:  Procedure Laterality Date   CHOLECYSTECTOMY  07/13/2009   IR ANGIO INTRA EXTRACRAN SEL COM CAROTID INNOMINATE BILAT MOD SED  06/18/2020   IR ANGIO VERTEBRAL SEL SUBCLAVIAN INNOMINATE UNI R MOD SED  06/18/2020   IR ANGIO VERTEBRAL SEL VERTEBRAL UNI L MOD SED  06/18/2020   WISDOM TOOTH EXTRACTION      Allergies[1]   Objective:  Physical Exam General: Alert and oriented x3 in no acute distress  Dermatology: Hyperkeratotic lesion(s) present on the dorsal medial aspect of the left fifth toe adjacent fourth digit with associated tenderness. central nucleated core noted. Skin is warm, dry and supple bilateral lower extremities. Negative for open lesions or macerations.  Vascular: Palpable pedal pulses bilaterally. No edema or erythema noted. Capillary refill within normal limits.  Neurological: Grossly intact via light  touch  Musculoskeletal Exam: Pain on palpation at the keratotic lesion(s) noted. Range of motion within normal limits bilateral. Muscle strength 5/5 in all groups bilateral.  Assessment: 1.  Symptomatic benign skin lesion left fifth toe   Plan of Care:  -Patient evaluated -Excisional debridement of keratoic lesion(s) using a chisel blade was performed without incident.  -Salicylic acid applied with a bandaid -Recommend Band-Aid to the area daily to reduce friction from the adjacent digit -Recommend wide fitting shoes that do not irritate or constrict the toebox area -Continue pedicures.  She routinely goes and gets pedicures without any complication -Return to the clinic PRN.   Thresa EMERSON Sar, DPM [facility]  Dr. Thresa EMERSON Sar, DPM    [address] [place], [place] [address]                Office [phone]  Fax [phone]        [1]  Allergies Allergen Reactions   Penicillins Other (See Comments)    Unknown reaction  Did it involve swelling of the face/tongue/throat, SOB, or low BP? Unknown Did it involve sudden or severe rash/hives,  skin peeling, or any reaction on the inside of your mouth or nose? Unknown Did you need to seek medical attention at a hospital or doctor's office? No When did it last happen?     childhood  If all above answers are NO, may proceed with cephalosporin use.     Remicade [Infliximab] Other (See Comments)    Unknown reaction   "

## 2024-07-27 ENCOUNTER — Emergency Department (HOSPITAL_COMMUNITY)
Admission: EM | Admit: 2024-07-27 | Discharge: 2024-07-27 | Disposition: A | Discharge status: Home / Self Care | Attending: Emergency Medicine | Admitting: Emergency Medicine

## 2024-07-27 ENCOUNTER — Ambulatory Visit

## 2024-07-27 ENCOUNTER — Emergency Department (HOSPITAL_COMMUNITY)

## 2024-07-27 ENCOUNTER — Other Ambulatory Visit: Payer: Self-pay

## 2024-07-27 DIAGNOSIS — M50322 Other cervical disc degeneration at C5-C6 level: Secondary | ICD-10-CM | POA: Insufficient documentation

## 2024-07-27 DIAGNOSIS — R55 Syncope and collapse: Secondary | ICD-10-CM | POA: Diagnosis not present

## 2024-07-27 DIAGNOSIS — M4802 Spinal stenosis, cervical region: Secondary | ICD-10-CM

## 2024-07-27 DIAGNOSIS — E860 Dehydration: Secondary | ICD-10-CM | POA: Diagnosis not present

## 2024-07-27 DIAGNOSIS — M069 Rheumatoid arthritis, unspecified: Secondary | ICD-10-CM | POA: Insufficient documentation

## 2024-07-27 DIAGNOSIS — R0602 Shortness of breath: Secondary | ICD-10-CM | POA: Diagnosis not present

## 2024-07-27 DIAGNOSIS — R42 Dizziness and giddiness: Secondary | ICD-10-CM | POA: Diagnosis present

## 2024-07-27 DIAGNOSIS — M5134 Other intervertebral disc degeneration, thoracic region: Secondary | ICD-10-CM | POA: Diagnosis not present

## 2024-07-27 DIAGNOSIS — Z7952 Long term (current) use of systemic steroids: Secondary | ICD-10-CM | POA: Insufficient documentation

## 2024-07-27 LAB — I-STAT CHEM 8, ED
BUN: 27 mg/dL — ABNORMAL HIGH (ref 8–23)
Calcium, Ion: 1.04 mmol/L — ABNORMAL LOW (ref 1.15–1.40)
Chloride: 98 mmol/L (ref 98–111)
Creatinine, Ser: 1.2 mg/dL — ABNORMAL HIGH (ref 0.44–1.00)
Glucose, Bld: 102 mg/dL — ABNORMAL HIGH (ref 70–99)
HCT: 37 % (ref 36.0–46.0)
Hemoglobin: 12.6 g/dL (ref 12.0–15.0)
Potassium: 4.1 mmol/L (ref 3.5–5.1)
Sodium: 133 mmol/L — ABNORMAL LOW (ref 135–145)
TCO2: 26 mmol/L (ref 22–32)

## 2024-07-27 LAB — CBC WITH DIFFERENTIAL/PLATELET
Abs Immature Granulocytes: 0.1 K/uL — ABNORMAL HIGH (ref 0.00–0.07)
Basophils Absolute: 0 K/uL (ref 0.0–0.1)
Basophils Relative: 0 %
Eosinophils Absolute: 0 K/uL (ref 0.0–0.5)
Eosinophils Relative: 0 %
HCT: 38.1 % (ref 36.0–46.0)
Hemoglobin: 11.8 g/dL — ABNORMAL LOW (ref 12.0–15.0)
Immature Granulocytes: 1 %
Lymphocytes Relative: 9 %
Lymphs Abs: 1.6 K/uL (ref 0.7–4.0)
MCH: 29.4 pg (ref 26.0–34.0)
MCHC: 31 g/dL (ref 30.0–36.0)
MCV: 94.8 fL (ref 80.0–100.0)
Monocytes Absolute: 1.8 K/uL — ABNORMAL HIGH (ref 0.1–1.0)
Monocytes Relative: 10 %
Neutro Abs: 14.6 K/uL — ABNORMAL HIGH (ref 1.7–7.7)
Neutrophils Relative %: 80 %
Platelets: 260 K/uL (ref 150–400)
RBC: 4.02 MIL/uL (ref 3.87–5.11)
RDW: 14.6 % (ref 11.5–15.5)
WBC: 18.1 K/uL — ABNORMAL HIGH (ref 4.0–10.5)
nRBC: 0 % (ref 0.0–0.2)

## 2024-07-27 LAB — COMPREHENSIVE METABOLIC PANEL WITH GFR
ALT: 25 U/L (ref 0–44)
AST: 28 U/L (ref 15–41)
Albumin: 3.7 g/dL (ref 3.5–5.0)
Alkaline Phosphatase: 49 U/L (ref 38–126)
Anion gap: 7 (ref 5–15)
BUN: 22 mg/dL (ref 8–23)
CO2: 29 mmol/L (ref 22–32)
Calcium: 8.9 mg/dL (ref 8.9–10.3)
Chloride: 99 mmol/L (ref 98–111)
Creatinine, Ser: 1.12 mg/dL — ABNORMAL HIGH (ref 0.44–1.00)
GFR, Estimated: 48 mL/min — ABNORMAL LOW
Glucose, Bld: 97 mg/dL (ref 70–99)
Potassium: 4.5 mmol/L (ref 3.5–5.1)
Sodium: 135 mmol/L (ref 135–145)
Total Bilirubin: 0.3 mg/dL (ref 0.0–1.2)
Total Protein: 7.1 g/dL (ref 6.5–8.1)

## 2024-07-27 LAB — URINALYSIS, W/ REFLEX TO CULTURE (INFECTION SUSPECTED)
Bilirubin Urine: NEGATIVE
Glucose, UA: NEGATIVE mg/dL
Hgb urine dipstick: NEGATIVE
Ketones, ur: NEGATIVE mg/dL
Nitrite: NEGATIVE
Protein, ur: NEGATIVE mg/dL
Specific Gravity, Urine: 1.019 (ref 1.005–1.030)
pH: 5 (ref 5.0–8.0)

## 2024-07-27 LAB — TROPONIN T, HIGH SENSITIVITY
Troponin T High Sensitivity: 31 ng/L — ABNORMAL HIGH (ref 0–19)
Troponin T High Sensitivity: 35 ng/L — ABNORMAL HIGH (ref 0–19)

## 2024-07-27 MED ORDER — SODIUM CHLORIDE 0.9 % IV BOLUS
1000.0000 mL | Freq: Once | INTRAVENOUS | Status: AC
  Administered 2024-07-27: 1000 mL via INTRAVENOUS

## 2024-07-27 MED ORDER — SODIUM CHLORIDE 0.9 % IV SOLN
2.0000 g | Freq: Once | INTRAVENOUS | Status: AC
  Administered 2024-07-27: 2 g via INTRAVENOUS
  Filled 2024-07-27: qty 20

## 2024-07-27 NOTE — Discharge Instructions (Addendum)
 Drink plenty of fluids.  Follow-up with your family doctor next week.  We have cultured your urine to see if the infection is completely cleared and the results will be back next week.  Follow-up with [facility] in the next month for those changes seen on your neck x-ray

## 2024-07-27 NOTE — ED Provider Notes (Signed)
 " [place] EMERGENCY DEPARTMENT AT [facility] Provider Note   CSN: [id number] Arrival date & time: 07/27/24  1023     Patient presents with: Loss of Consciousness   Stacey [name] is a 84 y.o. female.   Patient has a history of rheumatoid arthritis and takes prednisone .  She was dizzy and passed out this morning.  Patient states she has not been drinking much.  The history is provided by the patient and medical records. No language interpreter was used.  Loss of Consciousness Episode history:  Single Most recent episode:  Today Timing:  Sporadic Progression:  Resolved Chronicity:  New Context: not blood draw   Witnessed: no   Relieved by:  Nothing Worsened by:  Nothing Associated symptoms: dizziness   Associated symptoms: no chest pain, no headaches and no seizures        Prior to Admission medications  Medication Sig Start Date End Date Taking? Authorizing Provider  amitriptyline  (ELAVIL ) 25 MG tablet Take 1 tablet (25 mg total) by mouth at bedtime. 01/19/24   Amin, Saad, MD  benralizumab  (FASENRA  PEN) 30 MG/ML prefilled autoinjector Inject 30mg  (contents of one pen) in the skin at Week 4 (04/24/24) and Week 8 (05/22/24), then every 8 weeks thereafter. 03/27/24   Geronimo Amel, MD  BREO ELLIPTA  100-25 MCG/ACT AEPB Inhale 1 puff into the lungs daily. 11/19/23   [provider]  cholecalciferol  (VITAMIN D3) 25 MCG (1000 UNIT) tablet Take 1,000 Units by mouth every evening.    [provider]  gabapentin  (NEURONTIN ) 300 MG capsule Take 600 mg by mouth at bedtime.    [provider]  losartan  (COZAAR ) 25 MG tablet Take 12.5 mg by mouth daily. 02/04/21   [provider]  methylPREDNISolone  (MEDROL ) 8 MG tablet Take 2 tablets (16 mg total) by mouth daily for 14 days, THEN 1 tablet (8 mg total) daily for 14 days. In place of regular maintenance 4 mg tablets. 07/18/24 08/15/24  Jeannetta Lonni ORN, MD  Nintedanib  (OFEV ) 100 MG CAPS Take  1 capsule (100 mg total) by mouth 2 (two) times daily. 11/02/23   Geronimo Amel, MD  ondansetron  (ZOFRAN ) 4 MG tablet TAKE ONE TABLET BY MOUTH EVERY 8 HOURS AS NEEDED FOR NAUSEA AND VOMITING 05/19/24   Geronimo Amel, MD  pantoprazole  (PROTONIX ) 40 MG tablet Take 40 mg by mouth daily.    [provider]  polyethylene glycol (MIRALAX  / GLYCOLAX ) 17 g packet Take 17 g by mouth daily as needed for mild constipation. 06/25/23   Akula, Vijaya, MD  pravastatin  (PRAVACHOL ) 20 MG tablet TAKE ONE TABLET BY MOUTH DAILY 04/25/24   Lonni Slain, MD  venlafaxine  XR (EFFEXOR -XR) 75 MG 24 hr capsule Take 75 mg by mouth daily.    [provider]    Allergies: Penicillins and Remicade [infliximab]    Review of Systems  Constitutional:  Negative for appetite change and fatigue.  HENT:  Negative for congestion, ear discharge and sinus pressure.   Eyes:  Negative for discharge.  Respiratory:  Negative for cough.   Cardiovascular:  Positive for syncope. Negative for chest pain.  Gastrointestinal:  Negative for abdominal pain and diarrhea.  Genitourinary:  Negative for frequency and hematuria.  Musculoskeletal:  Negative for back pain.  Skin:  Negative for rash.  Neurological:  Positive for dizziness. Negative for seizures and headaches.  Psychiatric/Behavioral:  Negative for hallucinations.     Updated Vital Signs BP 106/89   Pulse 76   Temp 99.4 F (  37.4 C) (Oral)   Resp (!) 22   SpO2 98%   Physical Exam Vitals and nursing note reviewed.  Constitutional:      Appearance: She is well-developed.  HENT:     Head: Normocephalic.     Nose: Nose normal.  Eyes:     General: No scleral icterus.    Conjunctiva/sclera: Conjunctivae normal.  Neck:     Thyroid : No thyromegaly.  Cardiovascular:     Rate and Rhythm: Normal rate and regular rhythm.     Heart sounds: No murmur heard.    No friction rub. No gallop.  Pulmonary:     Breath sounds: No stridor. No wheezing or  rales.  Chest:     Chest wall: No tenderness.  Abdominal:     General: There is no distension.     Tenderness: There is no abdominal tenderness. There is no rebound.  Musculoskeletal:        General: Normal range of motion.     Cervical back: Neck supple.  Lymphadenopathy:     Cervical: No cervical adenopathy.  Skin:    Findings: No erythema or rash.  Neurological:     Mental Status: She is alert and oriented to person, place, and time.     Motor: No abnormal muscle tone.     Coordination: Coordination normal.  Psychiatric:        Behavior: Behavior normal.     (all labs ordered are listed, but only abnormal results are displayed) Labs Reviewed  COMPREHENSIVE METABOLIC PANEL WITH GFR - Abnormal; Notable for the following components:      Result Value   Creatinine, Ser 1.12 (*)    GFR, Estimated 48 (*)    All other components within normal limits  CBC WITH DIFFERENTIAL/PLATELET - Abnormal; Notable for the following components:   WBC 18.1 (*)    Hemoglobin 11.8 (*)    Neutro Abs 14.6 (*)    Monocytes Absolute 1.8 (*)    Abs Immature Granulocytes 0.10 (*)    All other components within normal limits  URINALYSIS, W/ REFLEX TO CULTURE (INFECTION SUSPECTED) - Abnormal; Notable for the following components:   APPearance HAZY (*)    Leukocytes,Ua SMALL (*)    Bacteria, UA RARE (*)    All other components within normal limits  I-STAT CHEM 8, ED - Abnormal; Notable for the following components:   Sodium 133 (*)    BUN 27 (*)    Creatinine, Ser 1.20 (*)    Glucose, Bld 102 (*)    Calcium , Ion 1.04 (*)    All other components within normal limits  TROPONIN T, HIGH SENSITIVITY - Abnormal; Notable for the following components:   Troponin T High Sensitivity 31 (*)    All other components within normal limits  TROPONIN T, HIGH SENSITIVITY - Abnormal; Notable for the following components:   Troponin T High Sensitivity 35 (*)    All other components within normal limits  URINE  CULTURE    EKG: EKG Interpretation Date/Time:  Thursday July 27 2024 10:41:04 EST Ventricular Rate:  82 PR Interval:  143 QRS Duration:  89 QT Interval:  350 QTC Calculation: 409 R Axis:   62  Text Interpretation: Sinus rhythm Atrial premature complex Nonspecific T abnormalities, lateral leads Confirmed by Suzette Pac [phone]) on 07/27/2024 5:07:19 PM  Radiology: [facility] Chest 2 View Result Date: 07/27/2024 CLINICAL DATA:  Shortness of breath.  Unwitnessed fall. EXAM: CHEST - 2 VIEW COMPARISON:  01/03/2024 and CT 11/09/2023 FINDINGS:  Lungs are adequately inflated with patchy coarse interstitial changes compatible with known fibrosis. Stable hazy opacification over the left lower lobe. Cardiomediastinal silhouette and remainder of the exam is unchanged. IMPRESSION: 1. Stable hazy opacification over the left lower lobe which may be due to atelectasis or infection. 2. Stable chronic interstitial lung disease/fibrosis. Electronically Signed   By: Toribio Agreste M.D.   On: 07/27/2024 11:32   CT Thoracic Spine Wo Contrast Result Date: 07/27/2024 EXAM: CT THORACIC SPINE WITHOUT CONTRAST 07/27/2024 11:00:26 AM TECHNIQUE: CT of the thoracic spine was performed without the administration of intravenous contrast. Multiplanar reformatted images are provided for review. Automated exposure control, iterative reconstruction, and/or weight based adjustment of the mA/kV was utilized to reduce the radiation dose to as low as reasonably achievable. COMPARISON: None available. CLINICAL HISTORY: Back trauma, no prior imaging (Age >= 16y) FINDINGS: BONES AND ALIGNMENT: Normal vertebral body heights. There are chronic downward bowing deformities of T5, T6 and T10. There are no apparent acute fractures. Normal alignment. DEGENERATIVE CHANGES: There is mild degenerative disc disease throughout the thoracic spine. SOFT TISSUES: There is apical pleural parenchymal fibrosis. IMPRESSION: 1. No acute abnormality of the thoracic  spine. 2. Chronic downward bowing deformities of T5, T6, and T10. 3. Mild degenerative disc disease throughout the thoracic spine. 4. Apical pleural parenchymal fibrosis. Electronically signed by: Evalene Coho MD 07/27/2024 11:12 AM EST RP Workstation: [id number]   CT Cervical Spine Wo Contrast Result Date: 07/27/2024 EXAM: CT CERVICAL SPINE WITHOUT CONTRAST 07/27/2024 11:00:26 AM TECHNIQUE: CT of the cervical spine was performed without the administration of intravenous contrast. Multiplanar reformatted images are provided for review. Automated exposure control, iterative reconstruction, and/or weight based adjustment of the mA/kV was utilized to reduce the radiation dose to as low as reasonably achievable. COMPARISON: CT of the cervical spine dated 01/01/2022. CLINICAL HISTORY: Neck trauma (Age >= 65y). FINDINGS: LIMITATIONS/ARTIFACTS: There is suboptimal positioning of the patient in the computer gantry. The head is rotated and tilted to the left. BONES AND ALIGNMENT: No acute fracture or traumatic malalignment. There is slight degenerative anterolisthesis at C3-C4 and C4-C5. DEGENERATIVE CHANGES: There is moderate chronic degenerative disc disease at C5-C6 and C6-C7, with posterior endplate ridging at both levels. There is moderate central spinal canal stenosis and bilateral neural foraminal stenosis at C6-C7. SOFT TISSUES: No prevertebral soft tissue swelling. There is moderate calcific atheromatous disease within the carotid bulbs. LUNGS: There is mild biapical pleural parenchymal fibrosis. IMPRESSION: 1. No evidence of acute traumatic injury. 2. Slight degenerative anterolisthesis at C3-4 and C4-5. 3. 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 4. Moderate central spinal canal stenosis and bilateral neural foraminal stenosis at C6-7. Electronically signed by: Evalene Coho MD 07/27/2024 11:09 AM EST RP Workstation: [id number]   CT  Head Wo Contrast Result Date: 07/27/2024 EXAM: CT HEAD WITHOUT CONTRAST 07/27/2024 11:00:26 AM TECHNIQUE: CT of the head was performed without the administration of intravenous contrast. Automated exposure control, iterative reconstruction, and/or weight based adjustment of the mA/kV was utilized to reduce the radiation dose to as low as reasonably achievable. COMPARISON: CT of the head dated 06/21/2023. CLINICAL HISTORY: Head trauma, minor (Age >= 65y). FINDINGS: BRAIN AND VENTRICLES: No acute hemorrhage. No evidence of acute infarct. Cerebral atrophy. Ventriculomegaly with disproportionate sulcal effacement. No extra-axial collection. No mass effect or midline shift. ORBITS: No acute abnormality. SINUSES: No acute abnormality. SOFT TISSUES AND SKULL: No acute soft tissue abnormality. No skull fracture. IMPRESSION: 1. No  acute intracranial abnormality. 2. Cerebral atrophy and ventriculomegaly with disproportionate sulcal effacement, which can be seen with normal pressure hydrocephalus in the appropriate clinical setting. Electronically signed by: Evalene Coho MD 07/27/2024 11:07 AM EST RP Workstation: [id number]     Procedures   Medications Ordered in the ED  sodium chloride  0.9 % bolus 1,000 mL (1,000 mLs Intravenous New Bag/Given 07/27/24 1737)  cefTRIAXone  (ROCEPHIN ) 2 g in sodium chloride  0.9 % 100 mL IVPB (2 g Intravenous New Bag/Given 07/27/24 1738)                                    Medical Decision Making  Patient with syncope and dehydration.  Labs unremarkable except for possible urinary tract infection.  Patient recently finished antibiotics and we will culture the urine.  X-rays showed some spinal stenosis and patient was referred to neurosurgery.  She has improved with fluids and will be discharged home and follow-up with PCP     Final diagnoses:  Syncope and collapse  Dehydration    ED Discharge Orders     None          Suzette Pac, MD 07/27/24 1907  "

## 2024-07-27 NOTE — ED Provider Triage Note (Signed)
 Emergency Medicine Provider Triage Evaluation Note  Stacey [name] , a 84 y.o. female  was evaluated in triage.  Pt complains of syncope.  Has been having a lot of joint pains related to her rheumatoid arthritis.  Was supposed to get an infusion today.  Was in the kitchen and had a syncopal episode.  She felt lightheaded prior to this.  She denies any injuries.  She is not anticoagulants.  She is having some mid back pain but she thinks it was from laying on the floor.  Review of Systems  Positive: Back pain, syncope Negative: Numbness or weakness to her extremities  Physical Exam  BP (!) 103/47   Pulse 81   Temp 98 F (36.7 C) (Oral)   Resp (!) 22   SpO2 (!) 86%  Gen:   Awake, no distress    Resp:  Normal effort   MSK:   Moves extremities without difficulty positive tenderness in the mid and lower thoracic spine. Other:     Medical Decision Making  Medically screening exam initiated at 10:39 AM.  Appropriate orders placed.  Stacey [name] was informed that the remainder of the evaluation will be completed by another provider, this initial triage assessment does not replace that evaluation, and the importance of remaining in the ED until their evaluation is complete.      Lenor Hollering, MD 07/27/24 1042

## 2024-07-27 NOTE — ED Triage Notes (Addendum)
 Pt from [facility] with unwitnessed fall, pt woke up on kitchen floor, pt does not remember the incident nor what happened, 12 lead unremarkable but does have PACs every 5, pt has  COPD, RA. Pt denies thinner or hitting head

## 2024-07-28 LAB — URINE CULTURE

## 2024-08-03 ENCOUNTER — Telehealth (HOSPITAL_COMMUNITY): Payer: Self-pay

## 2024-08-03 ENCOUNTER — Ambulatory Visit

## 2024-08-03 ENCOUNTER — Other Ambulatory Visit (HOSPITAL_COMMUNITY): Payer: Self-pay

## 2024-08-03 VITALS — BP 127/64 | HR 84 | Temp 98.4°F | Resp 20 | Ht 65.0 in | Wt 124.6 lb

## 2024-08-03 DIAGNOSIS — J849 Interstitial pulmonary disease, unspecified: Secondary | ICD-10-CM

## 2024-08-03 DIAGNOSIS — M0579 Rheumatoid arthritis with rheumatoid factor of multiple sites without organ or systems involvement: Secondary | ICD-10-CM | POA: Diagnosis not present

## 2024-08-03 MED ORDER — ALBUTEROL SULFATE HFA 108 (90 BASE) MCG/ACT IN AERS
2.0000 | INHALATION_SPRAY | Freq: Once | RESPIRATORY_TRACT | Status: AC | PRN
  Administered 2024-08-03: 2 via RESPIRATORY_TRACT

## 2024-08-03 MED ORDER — DIPHENHYDRAMINE HCL 25 MG PO CAPS: 50.0000 mg | ORAL_CAPSULE | Freq: Once | ORAL | Status: DC

## 2024-08-03 MED ORDER — METHYLPREDNISOLONE SODIUM SUCC 125 MG IJ SOLR
125.0000 mg | Freq: Once | INTRAMUSCULAR | Status: AC | PRN
  Administered 2024-08-03: 125 mg via INTRAVENOUS

## 2024-08-03 MED ORDER — SODIUM CHLORIDE 0.9 % IV SOLN: Freq: Once | INTRAVENOUS | Status: DC | PRN

## 2024-08-03 MED ORDER — METHYLPREDNISOLONE SODIUM SUCC 125 MG IJ SOLR: 100.0000 mg | Freq: Once | INTRAMUSCULAR | Status: DC

## 2024-08-03 MED ORDER — SODIUM CHLORIDE 0.9 % IV SOLN
1000.0000 mg | Freq: Once | INTRAVENOUS | Status: AC
  Administered 2024-08-03: 1000 mg via INTRAVENOUS
  Filled 2024-08-03: qty 100

## 2024-08-03 MED ORDER — DIPHENHYDRAMINE HCL 50 MG/ML IJ SOLN: 50.0000 mg | Freq: Once | INTRAMUSCULAR | Status: DC | PRN

## 2024-08-03 MED ORDER — FAMOTIDINE IN NACL 20-0.9 MG/50ML-% IV SOLN: 20.0000 mg | Freq: Once | INTRAVENOUS | Status: DC | PRN

## 2024-08-03 MED ORDER — ACETAMINOPHEN 325 MG PO TABS: 650.0000 mg | ORAL_TABLET | Freq: Once | ORAL | Status: DC

## 2024-08-03 MED ORDER — EPINEPHRINE 0.3 MG/0.3ML IJ SOAJ: 0.3000 mg | Freq: Once | INTRAMUSCULAR | Status: DC | PRN

## 2024-08-03 NOTE — Progress Notes (Signed)
 Diagnosis: ILD  Provider:  Lonna Coder MD  Procedure: IV Infusion  IV Type: Peripheral, IV Location: L Forearm   Rituxan  (Rituximab ), Dose: 1000 mg  Infusion Start Time: 0943  Infusion Stop Time: 1105  At 11:05 am, patient complained of symptoms including generalized shaking, and subsequently RN noted wheezing. Patient stated she had a history of wheezing. Infusion stopped, emergency protocols initiated and emergency medications administered including Solumedrol 125 mg IVP at 11:32am and Albuterol  inhaler (2 puffs) at 11:24am . BP was elevated and O2 was slightly lower than baseline. Ordering provider Dr. Lonni Ester, MD notified via secure message at 11:06am, and responded at 11:17am. Medical director Dr. Praveen Mannam, MD notified via secure message at 11:06 am and responded at 11:08 am. Timing of periods of observation and medication administration completed per instructions from Dr. Coder and Dr. Ester. Patient reported improvement in symptoms following administration of emergency medications. Patient stated symptoms had completely resolved by 1232. Family member (daughter) was notified via phone per patient request. Patient was observed until 69. VSS. Per Dr. Coder, patient was discharged to home. All questions answered. Patient to receive her next infusion at the [facility].  Post Infusion IV Care: Peripheral IV Discontinued  Discharge: Condition: Stable, Destination: Home . AVS Declined  Performed by:  Leita FORBES Miles, LPN

## 2024-08-03 NOTE — Telephone Encounter (Addendum)
 Auth Submission: NO AUTH NEEDED Site of care: Site of care: [facility] Payer: Medicare A/B, [employer] Supplement Medication & CPT/J Code(s) submitted: Truxima  (Rituximab -abbs) [id number] Diagnosis Code:  Route of submission (phone, fax, portal):  Phone # Fax # Auth type: Buy/Bill HB Units/visits requested: 1000mg  q14 days x 2 doses Reference number:  Approval from: 08/03/24 to 07/12/25

## 2024-08-10 ENCOUNTER — Ambulatory Visit

## 2024-08-10 ENCOUNTER — Ambulatory Visit (HOSPITAL_COMMUNITY)
Admission: RE | Admit: 2024-08-10 | Discharge: 2024-08-10 | Disposition: A | Discharge status: Home / Self Care | Source: Ambulatory Visit | Attending: Internal Medicine | Admitting: Internal Medicine

## 2024-08-10 VITALS — BP 151/69 | HR 90 | Temp 97.5°F | Resp 16

## 2024-08-10 DIAGNOSIS — J849 Interstitial pulmonary disease, unspecified: Secondary | ICD-10-CM | POA: Diagnosis present

## 2024-08-10 DIAGNOSIS — M0579 Rheumatoid arthritis with rheumatoid factor of multiple sites without organ or systems involvement: Secondary | ICD-10-CM | POA: Diagnosis present

## 2024-08-10 MED ORDER — METHYLPREDNISOLONE SODIUM SUCC 125 MG IJ SOLR: 100.0000 mg | Freq: Once | INTRAMUSCULAR | Status: DC

## 2024-08-10 MED ORDER — ACETAMINOPHEN 325 MG PO TABS
ORAL_TABLET | ORAL | Status: AC
  Filled 2024-08-10: qty 2

## 2024-08-10 MED ORDER — SODIUM CHLORIDE 0.9 % IV SOLN
1000.0000 mg | Freq: Once | INTRAVENOUS | Status: AC
  Administered 2024-08-10: 1000 mg via INTRAVENOUS
  Filled 2024-08-10: qty 100

## 2024-08-10 MED ORDER — DIPHENHYDRAMINE HCL 25 MG PO CAPS: 50.0000 mg | ORAL_CAPSULE | Freq: Once | ORAL | Status: DC

## 2024-08-10 MED ORDER — ACETAMINOPHEN 325 MG PO TABS
650.0000 mg | ORAL_TABLET | Freq: Once | ORAL | Status: AC
  Administered 2024-08-10: 650 mg via ORAL

## 2024-08-11 NOTE — Progress Notes (Signed)
 Mrs. Meroney daughter called stating her mother was shaking today. They were wondering if it was a reaction to the Rituxan  IV given yesterday. Patient and her daughter stated she has had same shaking at other infusion site. Encouraged daughter to call Dr. Lonni Ester and make them aware. Also mentioned to daughter she might want to ask nurse at patient's living facility if she can have a benadryl .

## 2024-08-17 ENCOUNTER — Ambulatory Visit

## 2024-08-24 ENCOUNTER — Inpatient Hospital Stay (HOSPITAL_COMMUNITY): Admission: RE | Admit: 2024-08-24 | Source: Ambulatory Visit

## 2024-09-25 ENCOUNTER — Encounter

## 2024-09-25 ENCOUNTER — Ambulatory Visit: Admitting: Internal Medicine
